# Patient Record
Sex: Female | Born: 1955 | Race: White | Hispanic: No | Marital: Married | Smoking: Former smoker
Health system: Southern US, Community
[De-identification: ages and names within clinical notes are randomized; demographics above are authoritative.]

## PROBLEM LIST (undated history)

## (undated) DIAGNOSIS — Z8249 Family history of ischemic heart disease and other diseases of the circulatory system: Secondary | ICD-10-CM

## (undated) DIAGNOSIS — M461 Sacroiliitis, not elsewhere classified: Secondary | ICD-10-CM

## (undated) DIAGNOSIS — E1169 Type 2 diabetes mellitus with other specified complication: Secondary | ICD-10-CM

## (undated) DIAGNOSIS — C210 Malignant neoplasm of anus, unspecified: Secondary | ICD-10-CM

## (undated) DIAGNOSIS — K589 Irritable bowel syndrome without diarrhea: Principal | ICD-10-CM

## (undated) DIAGNOSIS — F32A Depression, unspecified: Secondary | ICD-10-CM

## (undated) DIAGNOSIS — M542 Cervicalgia: Secondary | ICD-10-CM

## (undated) DIAGNOSIS — S72001A Fracture of unspecified part of neck of right femur, initial encounter for closed fracture: Secondary | ICD-10-CM

## (undated) DIAGNOSIS — M1990 Unspecified osteoarthritis, unspecified site: Secondary | ICD-10-CM

## (undated) DIAGNOSIS — E039 Hypothyroidism, unspecified: Secondary | ICD-10-CM

## (undated) DIAGNOSIS — I2510 Atherosclerotic heart disease of native coronary artery without angina pectoris: Secondary | ICD-10-CM

## (undated) DIAGNOSIS — G8929 Other chronic pain: Secondary | ICD-10-CM

## (undated) DIAGNOSIS — Z973 Presence of spectacles and contact lenses: Secondary | ICD-10-CM

## (undated) DIAGNOSIS — M549 Dorsalgia, unspecified: Secondary | ICD-10-CM

## (undated) DIAGNOSIS — R252 Cramp and spasm: Secondary | ICD-10-CM

## (undated) DIAGNOSIS — R232 Flushing: Secondary | ICD-10-CM

## (undated) DIAGNOSIS — Z5189 Encounter for other specified aftercare: Secondary | ICD-10-CM

## (undated) DIAGNOSIS — R9431 Abnormal electrocardiogram [ECG] [EKG]: Secondary | ICD-10-CM

## (undated) DIAGNOSIS — D473 Essential (hemorrhagic) thrombocythemia: Secondary | ICD-10-CM

## (undated) DIAGNOSIS — E785 Hyperlipidemia, unspecified: Secondary | ICD-10-CM

## (undated) DIAGNOSIS — F419 Anxiety disorder, unspecified: Secondary | ICD-10-CM

## (undated) DIAGNOSIS — H8110 Benign paroxysmal vertigo, unspecified ear: Secondary | ICD-10-CM

## (undated) DIAGNOSIS — Z8719 Personal history of other diseases of the digestive system: Secondary | ICD-10-CM

## (undated) DIAGNOSIS — I7409 Other arterial embolism and thrombosis of abdominal aorta: Secondary | ICD-10-CM

## (undated) DIAGNOSIS — R55 Syncope and collapse: Secondary | ICD-10-CM

## (undated) DIAGNOSIS — L659 Nonscarring hair loss, unspecified: Secondary | ICD-10-CM

## (undated) DIAGNOSIS — E119 Type 2 diabetes mellitus without complications: Secondary | ICD-10-CM

## (undated) DIAGNOSIS — F329 Major depressive disorder, single episode, unspecified: Secondary | ICD-10-CM

## (undated) DIAGNOSIS — D126 Benign neoplasm of colon, unspecified: Secondary | ICD-10-CM

## (undated) DIAGNOSIS — K219 Gastro-esophageal reflux disease without esophagitis: Secondary | ICD-10-CM

## (undated) DIAGNOSIS — R42 Dizziness and giddiness: Secondary | ICD-10-CM

## (undated) DIAGNOSIS — T7840XA Allergy, unspecified, initial encounter: Secondary | ICD-10-CM

## (undated) DIAGNOSIS — I739 Peripheral vascular disease, unspecified: Secondary | ICD-10-CM

## (undated) DIAGNOSIS — D519 Vitamin B12 deficiency anemia, unspecified: Secondary | ICD-10-CM

## (undated) DIAGNOSIS — R51 Headache: Secondary | ICD-10-CM

## (undated) DIAGNOSIS — I429 Cardiomyopathy, unspecified: Secondary | ICD-10-CM

## (undated) DIAGNOSIS — L280 Lichen simplex chronicus: Secondary | ICD-10-CM

## (undated) HISTORY — DX: Fracture of unspecified part of neck of right femur, initial encounter for closed fracture: S72.001A

## (undated) HISTORY — DX: Anxiety disorder, unspecified: F41.9

## (undated) HISTORY — DX: Dizziness and giddiness: R42

## (undated) HISTORY — DX: Unspecified osteoarthritis, unspecified site: M19.90

## (undated) HISTORY — DX: Irritable bowel syndrome without diarrhea: K58.9

## (undated) HISTORY — DX: Allergy, unspecified, initial encounter: T78.40XA

## (undated) HISTORY — DX: Lichen simplex chronicus: L28.0

## (undated) HISTORY — DX: Malignant neoplasm of anus, unspecified: C21.0

## (undated) HISTORY — DX: Encounter for other specified aftercare: Z51.89

## (undated) HISTORY — DX: Hyperlipidemia, unspecified: E78.5

## (undated) HISTORY — DX: Essential (hemorrhagic) thrombocythemia: D47.3

## (undated) HISTORY — DX: Cramp and spasm: R25.2

## (undated) HISTORY — DX: Depression, unspecified: F32.A

## (undated) HISTORY — DX: Family history of ischemic heart disease and other diseases of the circulatory system: Z82.49

## (undated) HISTORY — DX: Peripheral vascular disease, unspecified: I73.9

## (undated) HISTORY — DX: Hypothyroidism, unspecified: E03.9

## (undated) HISTORY — PX: TONSILLECTOMY: SUR1361

## (undated) HISTORY — DX: Headache: R51

## (undated) HISTORY — DX: Benign paroxysmal vertigo, unspecified ear: H81.10

## (undated) HISTORY — DX: Major depressive disorder, single episode, unspecified: F32.9

## (undated) HISTORY — DX: Cervicalgia: M54.2

## (undated) HISTORY — PX: ECTOPIC PREGNANCY SURGERY: SHX613

## (undated) HISTORY — DX: Type 2 diabetes mellitus with other specified complication: E11.69

## (undated) HISTORY — DX: Type 2 diabetes mellitus without complications: E11.9

## (undated) HISTORY — DX: Vitamin B12 deficiency anemia, unspecified: D51.9

## (undated) HISTORY — DX: Other arterial embolism and thrombosis of abdominal aorta: I74.09

## (undated) HISTORY — PX: PILONIDAL CYST EXCISION: SHX744

## (undated) HISTORY — DX: Atherosclerotic heart disease of native coronary artery without angina pectoris: I25.10

## (undated) HISTORY — PX: COLONOSCOPY: SHX174

## (undated) HISTORY — DX: Sacroiliitis, not elsewhere classified: M46.1

## (undated) HISTORY — DX: Gastro-esophageal reflux disease without esophagitis: K21.9

## (undated) HISTORY — PX: POLYPECTOMY: SHX149

## (undated) HISTORY — PX: OTHER SURGICAL HISTORY: SHX169

## (undated) HISTORY — DX: Benign neoplasm of colon, unspecified: D12.6

## (undated) HISTORY — DX: Cardiomyopathy, unspecified: I42.9

## (undated) HISTORY — DX: Syncope and collapse: R55

## (undated) HISTORY — PX: MULTIPLE TOOTH EXTRACTIONS: SHX2053

---

## 1998-06-18 ENCOUNTER — Other Ambulatory Visit: Admission: RE | Admit: 1998-06-18 | Discharge: 1998-06-18 | Payer: Self-pay | Admitting: *Deleted

## 2003-09-08 DIAGNOSIS — D126 Benign neoplasm of colon, unspecified: Secondary | ICD-10-CM

## 2003-09-08 HISTORY — DX: Benign neoplasm of colon, unspecified: D12.6

## 2003-09-16 ENCOUNTER — Encounter: Admission: RE | Admit: 2003-09-16 | Discharge: 2003-09-16 | Payer: Self-pay | Admitting: Pain Medicine

## 2005-02-19 LAB — HM COLONOSCOPY: HM Colonoscopy: NORMAL

## 2005-08-22 ENCOUNTER — Other Ambulatory Visit: Admission: RE | Admit: 2005-08-22 | Discharge: 2005-08-22 | Payer: Self-pay | Admitting: Obstetrics and Gynecology

## 2006-04-21 LAB — HM PAP SMEAR: HM Pap smear: NORMAL

## 2013-03-29 ENCOUNTER — Ambulatory Visit (INDEPENDENT_AMBULATORY_CARE_PROVIDER_SITE_OTHER): Payer: No Typology Code available for payment source | Admitting: Family Medicine

## 2013-03-29 ENCOUNTER — Encounter: Payer: Self-pay | Admitting: Family Medicine

## 2013-03-29 VITALS — BP 120/80 | HR 100 | Temp 98.2°F | Ht 63.75 in | Wt 117.0 lb

## 2013-03-29 DIAGNOSIS — Z7189 Other specified counseling: Secondary | ICD-10-CM

## 2013-03-29 DIAGNOSIS — K589 Irritable bowel syndrome without diarrhea: Secondary | ICD-10-CM

## 2013-03-29 DIAGNOSIS — L719 Rosacea, unspecified: Secondary | ICD-10-CM

## 2013-03-29 DIAGNOSIS — L280 Lichen simplex chronicus: Secondary | ICD-10-CM

## 2013-03-29 DIAGNOSIS — R519 Headache, unspecified: Secondary | ICD-10-CM

## 2013-03-29 DIAGNOSIS — Z8349 Family history of other endocrine, nutritional and metabolic diseases: Secondary | ICD-10-CM

## 2013-03-29 DIAGNOSIS — Z136 Encounter for screening for cardiovascular disorders: Secondary | ICD-10-CM

## 2013-03-29 DIAGNOSIS — Z7689 Persons encountering health services in other specified circumstances: Secondary | ICD-10-CM

## 2013-03-29 DIAGNOSIS — R51 Headache: Secondary | ICD-10-CM

## 2013-03-29 HISTORY — DX: Headache, unspecified: R51.9

## 2013-03-29 HISTORY — DX: Irritable bowel syndrome, unspecified: K58.9

## 2013-03-29 HISTORY — DX: Lichen simplex chronicus: L28.0

## 2013-03-29 LAB — BASIC METABOLIC PANEL
BUN: 16 mg/dL (ref 6–23)
CO2: 21 mEq/L (ref 19–32)
Calcium: 9 mg/dL (ref 8.4–10.5)
Chloride: 108 mEq/L (ref 96–112)
Creatinine, Ser: 0.9 mg/dL (ref 0.4–1.2)
GFR: 71.43 mL/min (ref >60.00)
Glucose, Bld: 77 mg/dL (ref 70–99)
Potassium: 5.3 mEq/L — ABNORMAL HIGH (ref 3.5–5.1)
Sodium: 139 mEq/L (ref 135–145)

## 2013-03-29 LAB — LIPID PANEL
Cholesterol: 281 mg/dL — ABNORMAL HIGH (ref 0–200)
HDL: 40.7 mg/dL (ref >39.00)
Total CHOL/HDL Ratio: 7
Triglycerides: 178 mg/dL — ABNORMAL HIGH (ref 0.0–149.0)
VLDL: 35.6 mg/dL (ref 0.0–40.0)

## 2013-03-29 LAB — HEMOGLOBIN A1C: Hgb A1c MFr Bld: 6.7 % — ABNORMAL HIGH (ref 4.6–6.5)

## 2013-03-29 LAB — TSH: TSH: 0.57 u[IU]/mL (ref 0.35–5.50)

## 2013-03-29 MED ORDER — GABAPENTIN 600 MG PO TABS: 600.0000 mg | ORAL_TABLET | Freq: Two times a day (BID) | ORAL | Status: DC

## 2013-03-29 NOTE — Progress Notes (Signed)
Chief Complaint  Patient presents with  . Establish Care  . Hot Flashes  . Fatigue  . Alopecia    HPI:  Erin [name] is here to establish care. Used to see Dr. Charmian Muff and then [facility] - but has been a long time since she has seen a doctor. Last PCP and physical: last pap and physical in 2006.  Has the following chronic problems and concerns today:  Patient Active Problem List   Diagnosis Date Noted  . IBS (irritable bowel syndrome) 03/29/2013  . Chronic daily headache 03/29/2013  . 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   Chronic Back Pain and Anxiety and Neurodermatitis: -prescribed neurontin many years ago for this and takes this  Chronic Headaches: -her whole life -take BCs for this -has a few per week -not changed -denies aura, nausea, vomiting, photosensity  IBS: -uses dulcolax and gas-x for this -saw GI in the past  Tobacco Use: -she has no interest in quitting -it helps her nerves -she understands the risks, but would rather smoke then quit -used many meds in the past buspar, elavil, Wellbutrin - didn't work  ROS: See pertinent positives and negatives per HPI.  Past Medical History  Diagnosis Date  . 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 Chronic daily headache 03/29/2013  . IBS (irritable bowel syndrome) 03/29/2013    Family History  Problem Relation Age of Onset  . Arthritis Mother   . Hyperlipidemia Mother   . Heart disease Mother   . Hypertension Mother   . Stroke Mother 97  . Heart disease Father   . Hyperlipidemia Father   . Hypertension Father   . Stroke Father 7  . Cancer Brother     lung    History   Social History  . Marital Status: Married    Spouse Name: N/A    Number of Children: N/A  . Years of Education: N/A   Social History Main Topics  . Smoking status: Current Every Day Smoker -- 1.00 packs/day    Types: Cigarettes  . Smokeless tobacco: None  . Alcohol Use: No  . Drug Use: None  . Sexually Active: Yes -- Female partner(s)   Other  Topics Concern  . None   Social History Narrative   Work or School: homemaker      Home Situation: lives with her husband, takes care of her parents       Spiritual Beliefs: Christian      Lifestyle: no regular exercise, poor diet             Current outpatient prescriptions:bisacodyl (DULCOLAX) 5 MG EC tablet, Take 15 mg by mouth daily as needed for constipation., Disp: , Rfl: ;  gabapentin (NEURONTIN) 600 MG tablet, Take 1 tablet (600 mg total) by mouth 2 (two) times daily., Disp: 180 tablet, Rfl: 0;  simethicone (GAS-X) 80 MG chewable tablet, Chew 80 mg by mouth every 6 (six) hours as needed for flatulence., Disp: , Rfl:   EXAM:  Filed Vitals:   03/29/13 1115  BP: 120/80  Pulse: 100  Temp: 98.2 F (36.8 C)    Body mass index is 20.25 kg/(m^2).  GENERAL: vitals reviewed and listed above, alert, oriented, appears well hydrated and in no acute distress  HEENT: atraumatic, conjunttiva clear, no obvious abnormalities on inspection of external nose and ears  NECK: no obvious masses on inspection  LUNGS: clear to auscultation bilaterally, no wheezes, rales or rhonchi, good air movement  CV: HRRR, no peripheral edema  MS: moves all extremities without noticeable abnormality  PSYCH: pleasant and cooperative, no obvious depression or anxiety  ASSESSMENT AND PLAN:  Discussed the following assessment and plan:  IBS (irritable bowel syndrome)  Chronic daily headache  Neurodermatitis - Plan: gabapentin (NEURONTIN) 600 MG tablet  Family history of hypothyroidism - Plan: TSH  Encounter to establish care - Plan: Lipid Panel, Hemoglobin A1c, Basic metabolic panel  -We reviewed the PMH, PSH, FH, SH, Meds and Allergies. -We provided refills for any medications we will prescribe as needed. -We addressed current concerns per orders and patient instructions. -We have asked for records for pertinent exams, studies, vaccines and notes from previous providers. -We have advised  patient to follow up per instructions below. -advised mammograms, colon, physical with pap - she refuses most screening measures but agrees to come for pap and physical -advised to quit smoking, she is not ready to do this now -FASTING LABS  -Patient advised to return or notify a doctor immediately if symptoms worsen or persist or new concerns arise.  Patient Instructions  -We have ordered labs or studies at this visit. It can take up to 1-2 weeks for results and processing. We will contact you with instructions IF your results are abnormal. Normal results will be released to your [facility]. If you have not heard from [place] or can not find your results in [facility] in 2 weeks please contact our office.  -PLEASE SIGN UP FOR MYCHART TODAY   -do self breast exam every month, consider mammogram - it is recommended  -consider counseling, please consider quitting smoking  -keep headache journal, wean off of aspirin, ibuprofen tylenol for 6 months, use topical menthol (tiger balm) instead  We recommend the following healthy lifestyle measures: - eat a healthy diet consisting of lots of vegetables, fruits, beans, nuts, seeds, healthy meats such as white chicken and fish and whole grains.  - avoid fried foods, fast food, processed foods, sodas, red meet and other fattening foods.  - get a least 150 minutes of aerobic exercise per week.   Follow up in: the next one month for your physical exam and to discuss labs      Mattthew Ziomek, [facility] R.

## 2013-03-29 NOTE — Patient Instructions (Signed)
-  We have ordered labs or studies at this visit. It can take up to 1-2 weeks for results and processing. We will contact you with instructions IF your results are abnormal. Normal results will be released to your [facility]. If you have not heard from [place] or can not find your results in [facility] in 2 weeks please contact our office.  -PLEASE SIGN UP FOR MYCHART TODAY   -do self breast exam every month, consider mammogram - it is recommended  -consider counseling, please consider quitting smoking  -keep headache journal, wean off of aspirin, ibuprofen tylenol for 6 months, use topical menthol (tiger balm) instead  We recommend the following healthy lifestyle measures: - eat a healthy diet consisting of lots of vegetables, fruits, beans, nuts, seeds, healthy meats such as white chicken and fish and whole grains.  - avoid fried foods, fast food, processed foods, sodas, red meet and other fattening foods.  - get a least 150 minutes of aerobic exercise per week.   Follow up in: the next one month for your physical exam and to discuss labs

## 2013-04-01 ENCOUNTER — Telehealth: Payer: Self-pay | Admitting: Family Medicine

## 2013-04-01 DIAGNOSIS — E119 Type 2 diabetes mellitus without complications: Secondary | ICD-10-CM

## 2013-04-01 LAB — LDL CHOLESTEROL, DIRECT: Direct LDL: 199.3 mg/dL

## 2013-04-01 NOTE — Telephone Encounter (Signed)
Lab findings:  -unfortunately labs suggest a diagnosis of diabetes and high cholesterol -a healthy diet, regular cardiovascular exercise and smoking cessation will be very important to help treat these and avoid health complications  -Needs to schedule follow up appointment in 2 months and will recheck labs then and discuss treatment options. Alisha, please schedule this appointment.   -in the meantime if she would like to see the nutritionist and diabetes educator - please place referral. Thanks.

## 2013-04-01 NOTE — Telephone Encounter (Signed)
Called and spoke with pt and pt is aware.  Pt would like referral for nutritionist and diabetic educator.

## 2013-04-01 NOTE — Addendum Note (Signed)
Addended by: Azucena Freed on: 04/01/2013 02:59 PM   Modules accepted: Orders

## 2013-04-01 NOTE — Telephone Encounter (Signed)
Referral ordered

## 2013-04-22 ENCOUNTER — Encounter: Payer: Self-pay | Admitting: Family Medicine

## 2013-04-22 ENCOUNTER — Other Ambulatory Visit (HOSPITAL_COMMUNITY)
Admission: RE | Admit: 2013-04-22 | Discharge: 2013-04-22 | Disposition: A | Discharge status: Home / Self Care | Payer: No Typology Code available for payment source | Source: Ambulatory Visit | Attending: Family Medicine | Admitting: Family Medicine

## 2013-04-22 ENCOUNTER — Ambulatory Visit (INDEPENDENT_AMBULATORY_CARE_PROVIDER_SITE_OTHER): Payer: No Typology Code available for payment source | Admitting: Family Medicine

## 2013-04-22 ENCOUNTER — Other Ambulatory Visit (HOSPITAL_COMMUNITY): Admission: RE | Admit: 2013-04-22 | Payer: No Typology Code available for payment source | Source: Ambulatory Visit

## 2013-04-22 VITALS — BP 110/70 | Temp 98.2°F | Ht 63.75 in | Wt 118.0 lb

## 2013-04-22 DIAGNOSIS — F17200 Nicotine dependence, unspecified, uncomplicated: Secondary | ICD-10-CM

## 2013-04-22 DIAGNOSIS — J309 Allergic rhinitis, unspecified: Secondary | ICD-10-CM

## 2013-04-22 DIAGNOSIS — E119 Type 2 diabetes mellitus without complications: Secondary | ICD-10-CM

## 2013-04-22 DIAGNOSIS — E785 Hyperlipidemia, unspecified: Secondary | ICD-10-CM

## 2013-04-22 DIAGNOSIS — Z0000 Encounter for general adult medical examination without abnormal findings: Secondary | ICD-10-CM

## 2013-04-22 DIAGNOSIS — Z1151 Encounter for screening for human papillomavirus (HPV): Secondary | ICD-10-CM | POA: Insufficient documentation

## 2013-04-22 DIAGNOSIS — Z01419 Encounter for gynecological examination (general) (routine) without abnormal findings: Secondary | ICD-10-CM | POA: Insufficient documentation

## 2013-04-22 DIAGNOSIS — Z1211 Encounter for screening for malignant neoplasm of colon: Secondary | ICD-10-CM

## 2013-04-22 DIAGNOSIS — N6459 Other signs and symptoms in breast: Secondary | ICD-10-CM

## 2013-04-22 MED ORDER — PRAVASTATIN SODIUM 20 MG PO TABS: 20.0000 mg | ORAL_TABLET | Freq: Every day | ORAL | Status: DC

## 2013-04-22 MED ORDER — FLUTICASONE PROPIONATE 50 MCG/ACT NA SUSP: 2.0000 | Freq: Every day | NASAL | Status: DC

## 2013-04-22 NOTE — Progress Notes (Signed)
Chief Complaint  Patient presents with  . Annual Exam    HPI:  Here for CPE:  -Concerns today:   1) New diagnosis of diabetes: Lab Results  Component Value Date   HGBA1C 6.7* 03/29/2013  -referred to diabetes educator -currently controlled with lifestyle: plans on starting regular exercise program, no regular cardiovascular exercise, has been eating more vegetables  2) Hyperlipidemia - new on labs: Lab Results  Component Value Date   CHOL 281* 03/29/2013   HDL 40.70 03/29/2013   LDLDIRECT 199.3 03/29/2013   TRIG 178.0* 03/29/2013   CHOLHDL 7 03/29/2013   3) Tobacco Use: -has tried many different med in the past -not interested in quitting  4)Allergic rhinitis: -seasonal -sneezing, nasal congestion  -Diet: variety of foods, balance and well rounded, larger portion sizes  -Taking folic acid: no  -Exercise: no regular exercise  -Diabetes and Dyslipidemia Screening: see above  -Hx of HTN: no  -Vaccines: UTD  -pap history: last pap was 2006 she thinks, no hx abnormal pap per her report  -FDLMP: N/A, no bleeding  -sexual activity: no, not in last 10 years per her report  -wants STI testing: no  -FH breast, colon or ovarian ca: see FH -refuses breast ca screening with mammo, will do breast exams -wants referral for colonoscopy  -Alcohol, Tobacco, drug use: see social history  Review of Systems - change in ha, vision changes, cp, sob, doe, cough, fevers, weight loss, swelling, syncope, rash, abnormal bleeding, vaginal discharge, breast lumps or discharge  Past Medical History  Diagnosis Date  . 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 Chronic daily headache 03/29/2013  . IBS (irritable bowel syndrome) 03/29/2013    Family History  Problem Relation Age of Onset  . Arthritis Mother   . Hyperlipidemia Mother   . Heart disease Mother   . Hypertension Mother   . Stroke Mother 52  . Heart disease Father   . Hyperlipidemia Father   . Hypertension Father   . Stroke Father 70  .  Cancer Brother     lung    History   Social History  . Marital Status: Married    Spouse Name: N/A    Number of Children: N/A  . Years of Education: N/A   Social History Main Topics  . Smoking status: Current Every Day Smoker -- 1.00 packs/day    Types: Cigarettes  . Smokeless tobacco: None  . Alcohol Use: No  . Drug Use: None  . Sexually Active: Yes -- Female partner(s)   Other Topics Concern  . None   Social History Narrative   Work or School: homemaker      Home Situation: lives with her husband, takes care of her parents       Spiritual Beliefs: Christian      Lifestyle: no regular exercise, poor diet             Current outpatient prescriptions:bisacodyl (DULCOLAX) 5 MG EC tablet, Take 15 mg by mouth daily as needed for constipation., Disp: , Rfl: ;  gabapentin (NEURONTIN) 600 MG tablet, Take 600 mg by mouth 3 (three) times daily., Disp: , Rfl: ;  simethicone (GAS-X) 80 MG chewable tablet, Chew 80 mg by mouth every 6 (six) hours as needed for flatulence., Disp: , Rfl:  fluticasone (FLONASE) 50 MCG/ACT nasal spray, Place 2 sprays into the nose daily., Disp: 16 g, Rfl: 6;  pravastatin (PRAVACHOL) 20 MG tablet, Take 1 tablet (20 mg total) by mouth daily., Disp: 90 tablet, Rfl: 3  EXAM:  Filed Vitals:   04/22/13 0933  BP: 110/70  Temp: 98.2 F (36.8 C)    GENERAL: vitals reviewed and listed below, alert, oriented, appears well hydrated and in no acute distress  HEENT: head atraumatic, PERRLA, normal appearance of eyes, ears, nose and mouth. moist mucus membranes.  NECK: supple, no masses or lymphadenopathy  LUNGS: clear to auscultation bilaterally, no rales, rhonchi or wheeze  CV: HRRR, no peripheral edema or cyanosis, normal pedal pulses  BREAST: normal appearance - no lesions or discharge, on palpation normal breast tissue without any suspicious masses except for few small increased areas of density in L breast at 2 and 3 O'clock, mobile and  tender  ABDOMEN: bowel sounds normal, soft, non tender to palpation, no masses, no rebound or guarding  GU: normal appearance of external genitalia - no lesions or masses, normal vaginal mucosa - no abnormal discharge, normal appearance of cervix - no lesions or abnormal discharge, no masses or tenderness on palpation of uterus and ovaries.  RECTAL: refused  SKIN: no rash or abnormal lesions  MS: normal gait, moves all extremities normally  NEURO: CN II-XII grossly intact, normal muscle strength and sensation to light touch on extremities  PSYCH: normal affect, pleasant and cooperative  ASSESSMENT AND PLAN:  Discussed the following assessment and plan:  Hyperlipemia - Plan: pravastatin (PRAVACHOL) 20 MG tablet -discussed options, will start pravastatin - discussed risks, follow up 3 months  Diabetes mellitus, type 2 -she cancelled diabetes educator appt due to cost, counseled on lifestyle changes -follow up 3 months  Tobacco use disorder -advised to quit, discussed risks, offered help, she is not ready to quit  Colon cancer screening - Plan: Ambulatory referral to Gastroenterology  Abnormal breast exam - Plan: MM Digital Diagnostic Bilat -likely fibrocystic, diagnostic mammo referral  Allergic rhinitis - Plan: fluticasone (FLONASE) 50 MCG/ACT nasal spray -discussed options, start flonase, risks and use discussed  -Discussed and advised all [place] preventive services health task force level A and B recommendations for age, sex and risks.  -Advised at least 150 minutes of exercise per week and a healthy diet low in saturated fats and sweets and consisting of fresh fruits and vegetables, lean meats such as fish and white chicken and whole grains.  -labs, studies and vaccines per orders this encounter  Orders Placed This Encounter  Procedures  . MM Digital Diagnostic Bilat    Standing Status: Future     Number of Occurrences:      Standing Expiration Date: 06/22/2014    Order  Specific Question:  Reason for Exam (SYMPTOM  OR DIAGNOSIS REQUIRED)    Answer:  has not had mammo in some time, abnormal breast exam - L, likely fibrocystic    Order Specific Question:  Is the patient pregnant?    Answer:  No    Order Specific Question:  Preferred imaging location?    Answer:  [facility]  . Ambulatory referral to Gastroenterology    Referral Priority:  Routine    Referral Type:  Consultation    Referral Reason:  Specialty Services Required    Requested Specialty:  Gastroenterology    Number of Visits Requested:  1    Patient Instructions  -We have ordered a pap smear. It can take up to 1-2 weeks for results and processing. We will contact you with instructions IF your results are abnormal. Normal results will be released to your [facility]. If you have not heard from [place] or can not find your results in  MYCHART in 2 weeks please contact our office.  -We placed a referral for you as discussed for your colonoscopy and a mammogram. It usually takes about 1-2 weeks to process and schedule this referral. If you have not heard from [place] regarding this appointment in 2 weeks please contact our office.  -As we discussed, we have prescribed a new medication (Pravastatin) for you at this appointment. We discussed the common and serious potential adverse effects of this medication and you can review these and more with the pharmacist when you pick up your medication.  Please follow the instructions for use carefully and notify us immediately if you have any problems taking this medication.  -stop smoking  -low carb or no carb diet  -regular cardiovascular exercise - 30 minutes at least 5 days per week  -follow up in 3 months             Patient advised to return to clinic immediately if symptoms worsen or persist or new concerns.  @LIFEPLAN @  Return in about 3 months (around 07/23/2013) for diabetes, HLD, tobacco use.  Kriste Basque R.

## 2013-04-22 NOTE — Patient Instructions (Addendum)
-  We have ordered a pap smear. It can take up to 1-2 weeks for results and processing. We will contact you with instructions IF your results are abnormal. Normal results will be released to your [facility]. If you have not heard from [place] or can not find your results in [facility] in 2 weeks please contact our office.  -We placed a referral for you as discussed for your colonoscopy and a mammogram. It usually takes about 1-2 weeks to process and schedule this referral. If you have not heard from [place] regarding this appointment in 2 weeks please contact our office.  -As we discussed, we have prescribed a new medication (Pravastatin) for you at this appointment. We discussed the common and serious potential adverse effects of this medication and you can review these and more with the pharmacist when you pick up your medication.  Please follow the instructions for use carefully and notify us immediately if you have any problems taking this medication.  -stop smoking  -low carb or no carb diet  -regular cardiovascular exercise - 30 minutes at least 5 days per week  -follow up in 3 months

## 2013-04-22 NOTE — Addendum Note (Signed)
Addended by: Azucena Freed on: 04/22/2013 10:37 AM   Modules accepted: Orders

## 2013-04-23 ENCOUNTER — Ambulatory Visit: Payer: No Typology Code available for payment source

## 2013-04-24 ENCOUNTER — Telehealth: Payer: Self-pay | Admitting: Family Medicine

## 2013-04-24 ENCOUNTER — Encounter: Payer: Self-pay | Admitting: Gastroenterology

## 2013-04-24 NOTE — Telephone Encounter (Signed)
PT called and stated that [facility] denies ever receiving her RX's for pravastatin (PRAVACHOL) 20 MG tablet, and fluticasone (FLONASE) 50 MCG/ACT nasal spray. Please assist.

## 2013-04-24 NOTE — Telephone Encounter (Signed)
Called the pharmacy and spoke with an associate that states in the profile it states that pt has an allergy to steroids.  Both rx were received.

## 2013-04-24 NOTE — Telephone Encounter (Signed)
Called and spoke with pt and pt states she forgot to tell us that she is allergic to prednsione.  Can rx for flonase be sent to pharmacy.

## 2013-04-24 NOTE — Telephone Encounter (Signed)
Called [facility] to make aware pt can have Flonase.

## 2013-04-24 NOTE — Telephone Encounter (Signed)
Left a vm that pt can have flonase filled.

## 2013-04-24 NOTE — Telephone Encounter (Signed)
Pt states she can take flonase because she has taken her mother's before.  Spoke with Dr. Selena Batten ok for pt to have flonase.

## 2013-04-25 NOTE — Progress Notes (Signed)
Called and spoke with Amy in cytology and they will return call with information.

## 2013-04-25 NOTE — Progress Notes (Signed)
Quick Note:  Called and spoke with pt and pt is aware. ______ 

## 2013-04-25 NOTE — Progress Notes (Signed)
Quick Note:  Called and left a message for pt to return call. ______ 

## 2013-05-13 ENCOUNTER — Ambulatory Visit
Admission: RE | Admit: 2013-05-13 | Discharge: 2013-05-13 | Disposition: A | Discharge status: Home / Self Care | Payer: No Typology Code available for payment source | Source: Ambulatory Visit | Attending: Family Medicine | Admitting: Family Medicine

## 2013-05-13 ENCOUNTER — Other Ambulatory Visit: Payer: Self-pay | Admitting: Family Medicine

## 2013-05-13 ENCOUNTER — Telehealth: Payer: Self-pay

## 2013-05-13 DIAGNOSIS — N6459 Other signs and symptoms in breast: Secondary | ICD-10-CM

## 2013-05-13 NOTE — Telephone Encounter (Signed)
This is a very low dose and i suspect this will improve after taking it for a few weeks. Would advise putting tablet in applesauce or other soft food and this should improve. Let us know if it does not.

## 2013-05-13 NOTE — Telephone Encounter (Signed)
VM:  Pt called and states that she is experiencing nausea with taking the Pravastin.  Pt states she tried taking the medication at night without food and it made her sick.  Pt then started taking the medication in the middle of the day with food and she is still experiencing nausea.  Pt would like to know if a medication can be called in for nausea or what she needs to do.  Pls advise.

## 2013-05-13 NOTE — Telephone Encounter (Signed)
Called and spoke with pt and pt is aware of Dr. Elmyra Ricks recommendations.  Pt states she is now taking medication at lunch with food and her symptoms are improving. Pt will call back if symptoms worsen.

## 2013-05-13 NOTE — Progress Notes (Signed)
Quick Note:  Called and spoke with pt and pt is aware. ______ 

## 2013-05-27 ENCOUNTER — Encounter: Payer: Self-pay | Admitting: Gastroenterology

## 2013-06-11 ENCOUNTER — Encounter: Payer: Self-pay | Admitting: Gastroenterology

## 2013-06-11 ENCOUNTER — Ambulatory Visit (INDEPENDENT_AMBULATORY_CARE_PROVIDER_SITE_OTHER): Payer: No Typology Code available for payment source | Admitting: Gastroenterology

## 2013-06-11 VITALS — BP 102/66 | HR 84 | Ht 63.5 in | Wt 117.1 lb

## 2013-06-11 DIAGNOSIS — K5900 Constipation, unspecified: Secondary | ICD-10-CM

## 2013-06-11 DIAGNOSIS — Z86010 Personal history of colonic polyps: Secondary | ICD-10-CM

## 2013-06-11 DIAGNOSIS — K6289 Other specified diseases of anus and rectum: Secondary | ICD-10-CM

## 2013-06-11 MED ORDER — PEG-KCL-NACL-NASULF-NA ASC-C 100 G PO SOLR: 1.0000 | Freq: Once | ORAL | Status: DC

## 2013-06-11 MED ORDER — DILTIAZEM GEL 2 %: 1.0000 "application " | Freq: Three times a day (TID) | CUTANEOUS | Status: DC

## 2013-06-11 NOTE — Patient Instructions (Addendum)
We have sent the following medications to [facility] in [facility] for you to pick up at your convenience: Diltiazem.  Use a stool softener every day.   Use Sitz bath as needed. Sitz Bath A sitz bath is a warm water bath taken in the sitting position that covers only the hips and buttocks. It may be used for either healing or hygiene purposes. Sitz baths are also used to relieve pain, itching, or muscle spasms. The water may contain medicine. Moist heat will help you heal and relax.  HOME CARE INSTRUCTIONS  Take 3 to 4 sitz baths a day. 1. Fill the bathtub half full with warm water. 2. Sit in the water and open the drain a little. 3. Turn on the warm water to keep the tub half full. Keep the water running constantly. 4. Soak in the water for 15 to 20 minutes. 5. After the sitz bath, pat the affected area dry first. SEEK MEDICAL CARE IF:  You get worse instead of better. Stop the sitz baths if you get worse. MAKE SURE YOU:  Understand these instructions.  Will watch your condition.  Will get help right away if you are not doing well or get worse. Document Released: 07/30/2004 Document Revised: 08/01/2012 Document Reviewed: 02/04/2011 [facility] Patient Information 2014 [place], [place].   You have been scheduled for a colonoscopy with propofol. Please follow written instructions given to you at your visit today.  Please pick up your prep kit at the pharmacy within the next 1-3 days. If you use inhalers (even only as needed), please bring them with you on the day of your procedure. Your physician has requested that you go to www.startemmi.com and enter the access code given to you at your visit today. This web site gives a general overview about your procedure. However, you should still follow specific instructions given to you by our office regarding your preparation for the procedure.  Thank you for choosing me and [facility].  Venita Lick. Pleas Koch., MD.,  Clementeen Graham  cc: Kriste Basque, DO

## 2013-06-11 NOTE — Progress Notes (Signed)
History of Present Illness: This is a 57 year old female with rectal pain for 3 weeks. She notes a sharp shooting pain and then a throbbing pain. She used suppositories and declofenac for 2 weeks and her symptoms have improved. She takes Ducolax pills daily for constipation for many years. Denies weight loss, abdominal pain, diarrhea, change in stool caliber, melena, hematochezia, nausea, vomiting, dysphagia, reflux symptoms, chest pain.  Review of Systems: Pertinent positive and negative review of systems were noted in the above HPI section. All other review of systems were otherwise negative.  Current Medications, Allergies, Past Medical History, Past Surgical History, Family History and Social History were reviewed in [employer] record.  Physical Exam: General: Well developed , well nourished, no acute distress Head: Normocephalic and atraumatic Eyes:  sclerae anicteric, EOMI Ears: Normal auditory acuity Mouth: No deformity or lesions Neck: Supple, no masses or thyromegaly Lungs: Clear throughout to auscultation Heart: Regular rate and rhythm; no murmurs, rubs or bruits Abdomen: Soft, non tender and non distended. No masses, hepatosplenomegaly or hernias noted. Normal Bowel sounds Rectal: no lesions noted externally, no fissure noted, marked anal canal tenderness, unable to do a complete DRE, heme negative stool  Musculoskeletal: Symmetrical with no gross deformities  Skin: No lesions on visible extremities Pulses:  Normal pulses noted Extremities: No clubbing, cyanosis, edema or deformities noted Neurological: Alert oriented x 4, grossly nonfocal Cervical Nodes:  No significant cervical adenopathy Inguinal Nodes: No significant inguinal adenopathy Psychological:  Alert and cooperative. Normal mood and affect  Assessment and Recommendations:  1. Rectal pain. R/O fissure, hemorrhoids. Diltiazem 2% cream tid for 8 weeks. Stool softener and Sitz baths daily.  2.  IBS-C, chronic. Continue Ducolax tablets  3. Personal history of adenomatous colon polyps, initially diagnosed in 2004. She did not return for recommended surveillance colonoscopy in 2009. The risks, benefits, and alternatives to colonoscopy with possible biopsy and possible polypectomy were discussed with the patient and they consent to proceed.

## 2013-06-13 ENCOUNTER — Encounter: Payer: Self-pay | Admitting: Gastroenterology

## 2013-06-26 ENCOUNTER — Telehealth: Payer: Self-pay | Admitting: Family Medicine

## 2013-06-26 MED ORDER — GABAPENTIN 600 MG PO TABS: 600.0000 mg | ORAL_TABLET | Freq: Three times a day (TID) | ORAL | Status: DC

## 2013-06-26 NOTE — Telephone Encounter (Signed)
Rx sent to pharmacy   

## 2013-06-26 NOTE — Telephone Encounter (Signed)
Called and spoke pt and pt states the neurontin was increased by Dr. Selena Batten- pt states she did not get the rx when he was here.  Pls advise.

## 2013-06-26 NOTE — Telephone Encounter (Signed)
Ok to refill. I do remember discussing at initial visit, I do not remember increasing the dose, but if she feels needs increased dose ok to refill for 600mg  tid.

## 2013-06-26 NOTE — Telephone Encounter (Signed)
I am not sure whom increased her neurontin? She believe she told us took bid?  Should address any rectal issues with her gastroenterologist.

## 2013-06-26 NOTE — Telephone Encounter (Signed)
Pt states this was discussed at her appt due to pt itching.  Pt states it was agreed upon at the visit.

## 2013-06-26 NOTE — Addendum Note (Signed)
Addended by: Azucena Freed on: 06/26/2013 11:30 AM   Modules accepted: Orders

## 2013-06-26 NOTE — Telephone Encounter (Signed)
Pt established in May of this year.  This medication has not been filled before.  Can I send in rx?

## 2013-06-26 NOTE — Telephone Encounter (Signed)
Pt needs new RX for gabapentin (NEURONTIN) 600 MG tablet. This Rx was increased to 3 X /day but no new RX was done. Pharm: [facility]/[address]  Pt is having rectal issues and could not complete her colonoscopy. Resc for 9/15.  Pt has internal hemmorroids. Dr Russella Dar suggested she wait until the swelling goes down. Pt states she has been in bad pain w/ this.

## 2013-07-04 MED ORDER — GABAPENTIN 600 MG PO TABS: 600.0000 mg | ORAL_TABLET | Freq: Three times a day (TID) | ORAL | Status: DC

## 2013-07-04 NOTE — Addendum Note (Signed)
Addended by: Azucena Freed on: 07/04/2013 04:20 PM   Modules accepted: Orders

## 2013-07-04 NOTE — Telephone Encounter (Signed)
Pt called again and requested to have medication sent to [facility] on [address].

## 2013-07-12 ENCOUNTER — Encounter: Payer: No Typology Code available for payment source | Admitting: Gastroenterology

## 2013-07-15 ENCOUNTER — Telehealth: Payer: Self-pay | Admitting: Gastroenterology

## 2013-07-15 NOTE — Telephone Encounter (Signed)
Patient reports rectal pain with using diltiazem cream.  She is asking for a suppository for pain.  She is advised to try Recticare prior to insertion of diltiazem.  She will try this and call back if this does not help

## 2013-07-16 ENCOUNTER — Telehealth: Payer: Self-pay

## 2013-07-16 NOTE — Telephone Encounter (Signed)
Received 2 fax from [facility] for pt's pravastin and flonase.  Called to verify pt's DOB because on the [facility] paperwork the DOB is incorrect.  Advised pt of this and pt states she has tried to correct this with [facility].  Pt states she gets a penalty once she has gotten a medication locally that it cost more.  Advised pt that rx request had been received and faxed back to [facility].

## 2013-07-29 ENCOUNTER — Encounter: Payer: Self-pay | Admitting: Family Medicine

## 2013-07-29 ENCOUNTER — Ambulatory Visit (INDEPENDENT_AMBULATORY_CARE_PROVIDER_SITE_OTHER): Payer: No Typology Code available for payment source | Admitting: Family Medicine

## 2013-07-29 VITALS — BP 100/72 | Temp 98.3°F | Wt 118.0 lb

## 2013-07-29 DIAGNOSIS — E785 Hyperlipidemia, unspecified: Secondary | ICD-10-CM

## 2013-07-29 DIAGNOSIS — L280 Lichen simplex chronicus: Secondary | ICD-10-CM

## 2013-07-29 DIAGNOSIS — K589 Irritable bowel syndrome without diarrhea: Secondary | ICD-10-CM

## 2013-07-29 DIAGNOSIS — E119 Type 2 diabetes mellitus without complications: Secondary | ICD-10-CM

## 2013-07-29 MED ORDER — GABAPENTIN 600 MG PO TABS: 600.0000 mg | ORAL_TABLET | Freq: Three times a day (TID) | ORAL | Status: DC

## 2013-07-29 NOTE — Progress Notes (Signed)
No chief complaint on file.   HPI:  Follow up:  1)DM - recently diagnosed: -referred to diabetes educator, lifestyle controlled -advised exercise and healthy diet Lab Results  Component Value Date   HGBA1C 6.7* 03/29/2013    2)HLD: -added pravastatin last visit Lab Results  Component Value Date   CHOL 281* 03/29/2013   HDL 40.70 03/29/2013   LDLDIRECT 199.3 03/29/2013   TRIG 178.0* 03/29/2013   CHOLHDL 7 03/29/2013  -fasting today  3)Tobacco use: -not interested in quitting  AR: -using flonase  Chronic headaches/neurodermatitis: -on neurotin  IBS/rectal pain: -sees GI, Dr. Russella Dar - has fissures; rectacare has helped -colonoscopy scheduled  HM: Offered flu and tdap today -scheduled for colonoscopy  ROS: See pertinent positives and negatives per HPI.  Past Medical History  Diagnosis Date  . 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 Chronic daily headache 03/29/2013  . IBS (irritable bowel syndrome) 03/29/2013  . Tubular adenoma of colon 2004  . Anxiety   . Depression   . DM (diabetes mellitus)   . HLD (hyperlipidemia)     Past Surgical History  Procedure Laterality Date  . Ectopic pregnancy surgery    . Pilonidal cyst excision      Family History  Problem Relation Age of Onset  . Arthritis Mother   . Hyperlipidemia Mother   . Heart disease Mother   . Hypertension Mother   . Stroke Mother 34  . Heart disease Father   . Hyperlipidemia Father   . Hypertension Father   . Stroke Father 46  . Lung cancer Brother   . Irritable bowel syndrome Mother   . Thyroid disease Father   . Thyroid disease Mother     History   Social History  . Marital Status: Married    Spouse Name: N/A    Number of Children: 0  . Years of Education: N/A   Occupational History  . cargiver    Social History Main Topics  . Smoking status: Current Every Day Smoker -- 1.00 packs/day    Types: Cigarettes  . Smokeless tobacco: Never Used  . Alcohol Use: No  . Drug Use: No  . Sexual Activity:  Yes    Partners: Male   Other Topics Concern  . None   Social History Narrative   Work or School: homemaker      Home Situation: lives with her husband, takes care of her parents       Spiritual Beliefs: Christian      Lifestyle: no regular exercise, poor diet             Current outpatient prescriptions:Aspirin-Salicylamide-Caffeine (BC HEADACHE POWDER PO), Take by mouth as needed., Disp: , Rfl: ;  bisacodyl (DULCOLAX) 5 MG EC tablet, Take 15 mg by mouth daily as needed for constipation., Disp: , Rfl: ;  cimetidine (TAGAMET) 200 MG tablet, Take 200 mg by mouth as needed., Disp: , Rfl: ;  diltiazem 2 % GEL, Apply 1 application topically 3 (three) times daily., Disp: 30 g, Rfl: 1 fluticasone (FLONASE) 50 MCG/ACT nasal spray, Place 2 sprays into the nose daily., Disp: 16 g, Rfl: 6;  gabapentin (NEURONTIN) 600 MG tablet, Take 1 tablet (600 mg total) by mouth 3 (three) times daily., Disp: 180 tablet, Rfl: 0;  Lactase (DAIRY DIGESTIVE PO), Take by mouth as needed., Disp: , Rfl: ;  peg 3350 powder (MOVIPREP) 100 G SOLR, Take 1 kit (100 g total) by mouth once., Disp: 1 kit, Rfl: 0 pravastatin (PRAVACHOL) 20 MG tablet, Take 1 tablet (  20 mg total) by mouth daily., Disp: 90 tablet, Rfl: 3;  Probiotic Product (PROBIOTIC DAILY PO), Take by mouth as needed., Disp: , Rfl: ;  simethicone (GAS-X) 80 MG chewable tablet, Chew 80 mg by mouth every 6 (six) hours as needed for flatulence., Disp: , Rfl:   EXAM:  Filed Vitals:   07/29/13 1013  BP: 100/72  Temp: 98.3 F (36.8 C)    Body mass index is 20.57 kg/(m^2).  GENERAL: vitals reviewed and listed above, alert, oriented, appears well hydrated and in no acute distress  HEENT: atraumatic, conjunttiva clear, no obvious abnormalities on inspection of external nose and ears  NECK: no obvious masses on inspection  LUNGS: clear to auscultation bilaterally, no wheezes, rales or rhonchi, good air movement  CV: HRRR, no peripheral edema  MS: moves all  extremities without noticeable abnormality  PSYCH: pleasant and cooperative, no obvious depression or anxiety  ASSESSMENT AND PLAN:  Discussed the following assessment and plan:  IBS (irritable bowel syndrome)  Neurodermatitis - Plan: gabapentin (NEURONTIN) 600 MG tablet  Diabetes - Plan: Hemoglobin A1c, Basic metabolic panel  Hyperlipemia - Plan: Lipid Panel  -FASTING LABS -advised lifestyle recs -smoking cessation counseling - she is not ready new - but is considering chantix and cutting back -seeing GI and getting colonoscopy for rectal issues, GI issues -Patient advised to return or notify a doctor immediately if symptoms worsen or persist or new concerns arise.  There are no Patient Instructions on file for this visit.   Erin Basque R.

## 2013-07-30 LAB — LIPID PANEL
Cholesterol: 251 mg/dL — ABNORMAL HIGH (ref 0–200)
HDL: 42.2 mg/dL (ref >39.00)
Total CHOL/HDL Ratio: 6
Triglycerides: 136 mg/dL (ref 0.0–149.0)
VLDL: 27.2 mg/dL (ref 0.0–40.0)

## 2013-07-30 LAB — BASIC METABOLIC PANEL
BUN: 18 mg/dL (ref 6–23)
CO2: 25 mEq/L (ref 19–32)
Calcium: 8.6 mg/dL (ref 8.4–10.5)
Chloride: 111 mEq/L (ref 96–112)
Creatinine, Ser: 0.8 mg/dL (ref 0.4–1.2)
GFR: 83.39 mL/min (ref >60.00)
Glucose, Bld: 85 mg/dL (ref 70–99)
Potassium: 4.8 mEq/L (ref 3.5–5.1)
Sodium: 142 mEq/L (ref 135–145)

## 2013-07-30 LAB — LDL CHOLESTEROL, DIRECT: Direct LDL: 180.4 mg/dL

## 2013-07-30 LAB — HEMOGLOBIN A1C: Hgb A1c MFr Bld: 6.9 % — ABNORMAL HIGH (ref 4.6–6.5)

## 2013-07-31 NOTE — Progress Notes (Signed)
Quick Note:  Left a message for pt to return call. ______ 

## 2013-08-01 NOTE — Progress Notes (Signed)
Quick Note:  Left a message for return call. ______ 

## 2013-08-05 ENCOUNTER — Ambulatory Visit (AMBULATORY_SURGERY_CENTER): Payer: No Typology Code available for payment source | Admitting: Gastroenterology

## 2013-08-05 ENCOUNTER — Encounter: Payer: Self-pay | Admitting: Gastroenterology

## 2013-08-05 ENCOUNTER — Other Ambulatory Visit (INDEPENDENT_AMBULATORY_CARE_PROVIDER_SITE_OTHER): Payer: No Typology Code available for payment source

## 2013-08-05 ENCOUNTER — Telehealth: Payer: Self-pay

## 2013-08-05 ENCOUNTER — Other Ambulatory Visit: Payer: Self-pay | Admitting: Gastroenterology

## 2013-08-05 VITALS — BP 107/46 | HR 68 | Temp 98.3°F | Resp 17 | Ht 63.5 in | Wt 117.0 lb

## 2013-08-05 DIAGNOSIS — R198 Other specified symptoms and signs involving the digestive system and abdomen: Secondary | ICD-10-CM

## 2013-08-05 DIAGNOSIS — K6289 Other specified diseases of anus and rectum: Secondary | ICD-10-CM

## 2013-08-05 DIAGNOSIS — Z86010 Personal history of colonic polyps: Secondary | ICD-10-CM

## 2013-08-05 DIAGNOSIS — D126 Benign neoplasm of colon, unspecified: Secondary | ICD-10-CM

## 2013-08-05 LAB — CBC WITH DIFFERENTIAL/PLATELET
Basophils Relative: 0.4 % (ref 0.0–3.0)
Eosinophils Absolute: 0.1 10*3/uL (ref 0.0–0.7)
Eosinophils Relative: 0.9 % (ref 0.0–5.0)
HCT: 30.7 % — ABNORMAL LOW (ref 36.0–46.0)
Hemoglobin: 10.3 g/dL — ABNORMAL LOW (ref 12.0–15.0)
MCHC: 33.7 g/dL (ref 30.0–36.0)
MCV: 90.2 fl (ref 78.0–100.0)
Monocytes Absolute: 0.6 10*3/uL (ref 0.1–1.0)
Neutro Abs: 4.8 10*3/uL (ref 1.4–7.7)
RBC: 3.4 Mil/uL — ABNORMAL LOW (ref 3.87–5.11)
WBC: 7.8 10*3/uL (ref 4.5–10.5)

## 2013-08-05 LAB — HEPATIC FUNCTION PANEL
Bilirubin, Direct: 0 mg/dL (ref 0.0–0.3)
Total Bilirubin: 0.5 mg/dL (ref 0.3–1.2)

## 2013-08-05 MED ORDER — SODIUM CHLORIDE 0.9 % IV SOLN: 500.0000 mL | INTRAVENOUS | Status: DC

## 2013-08-05 NOTE — Progress Notes (Signed)
Specimens sent out rush per order dr stark

## 2013-08-05 NOTE — Progress Notes (Signed)
Report given to Greer Ee, RN to resume care of pt. Erin [name]

## 2013-08-05 NOTE — Op Note (Signed)
[facility] [address] [place], [address]   COLONOSCOPY PROCEDURE REPORT  PATIENT: Erin [name], Erin [name]  MR#: [id number] BIRTHDATE: 1956/07/27 , 56  yrs. old GENDER: Female ENDOSCOPIST: Meryl Dare, MD, [facility] PROCEDURE DATE:  08/05/2013 PROCEDURE:   Colonoscopy with biopsy and snare polypectomy First Screening Colonoscopy - Avg.  risk and is 50 yrs.  old or older - No.  Prior Negative Screening - Now for repeat screening. N/A  History of Adenoma - Now for follow-up colonoscopy & has been > or = to 3 yrs.  Yes hx of adenoma.  Has been 3 or more years since last colonoscopy.  Polyps Removed Today? Yes. ASA CLASS:   Class II INDICATIONS:Patient's personal history of adenomatous colon polyps and rectal pain. MEDICATIONS: MAC sedation, administered by CRNA and propofol (Diprivan) 250mg  IV DESCRIPTION OF PROCEDURE:   After the risks benefits and alternatives of the procedure were thoroughly explained, informed consent was obtained.  A digital rectal exam revealed a palpable rectal mass.   The LB [id number]  endoscope was introduced through the anus and advanced to the cecum, which was identified by both the appendix and ileocecal valve. No adverse events experienced with a tortuous and redundant colon.  The quality of the prep was excellent, using MoviPrep. The instrument was then slowly withdrawn as the colon was fully examined.  COLON FINDINGS: A one-third circumferential firm mass, measuring 3.5 X 3 cm in size, with friable surfaces was found in rectum seen upon the retroflexed view. It extended to the dentate line. Multiple biopsies of the lesion were performed.   A sessile polyp measuring 6 mm in size was found in the descending colon.  A polypectomy was performed with a cold snare.  The resection was complete and the polyp tissue was completely retrieved. The colon was otherwise normal.  There was no diverticulosis, inflammation, polyps or cancers  unless previously stated.  Retroflexed views as above. The time to cecum=3 minutes 36 seconds.  Withdrawal time=9 minutes 15 seconds.  The scope was withdrawn and the procedure completed.  COMPLICATIONS: There were no complications.  ENDOSCOPIC IMPRESSION: 1.   Rectal mass, measuring 3.5 X 3 cm; multiple biopsies of the lesion performed 2.   Sessile polyp measuring 6 mm in the descending colon; polypectomy performed with a cold snare  RECOMMENDATIONS: 1.  Await pathology results 2.  My office will arrange for you to have a CT scan of abdomen/pelvis, a CBC and LFTs  eSigned:  Meryl Dare, MD, [facility] 08/05/2013 11:50 AM

## 2013-08-05 NOTE — Patient Instructions (Addendum)
YOU HAD AN ENDOSCOPIC PROCEDURE TODAY AT THE [facility]: Refer to the procedure report that was given to you for any specific questions about what was found during the examination.  If the procedure report does not answer your questions, please call your gastroenterologist to clarify.  If you requested that your care partner not be given the details of your procedure findings, then the procedure report has been included in a sealed envelope for you to review at your convenience later.  YOU SHOULD EXPECT: Some feelings of bloating in the abdomen. Passage of more gas than usual.  Walking can help get rid of the air that was put into your GI tract during the procedure and reduce the bloating. If you had a lower endoscopy (such as a colonoscopy or flexible sigmoidoscopy) you may notice spotting of blood in your stool or on the toilet paper. If you underwent a bowel prep for your procedure, then you may not have a normal bowel movement for a few days.  DIET: Your first meal following the procedure should be a light meal and then it is ok to progress to your normal diet.  A half-sandwich or bowl of soup is an example of a good first meal.  Heavy or fried foods are harder to digest and may make you feel nauseous or bloated.  Likewise meals heavy in dairy and vegetables can cause extra gas to form and this can also increase the bloating.  Drink plenty of fluids but you should avoid alcoholic beverages for 24 hours.  ACTIVITY: Your care partner should take you home directly after the procedure.  You should plan to take it easy, moving slowly for the rest of the day.  You can resume normal activity the day after the procedure however you should NOT DRIVE or use heavy machinery for 24 hours (because of the sedation medicines used during the test).    SYMPTOMS TO REPORT IMMEDIATELY: A gastroenterologist can be reached at any hour.  During normal business hours, 8:30 AM to 5:00 PM Monday through Friday,  call [phone].  After hours and on weekends, please call the GI answering service at [phone] who will take a message and have the physician on call contact you.   Following lower endoscopy (colonoscopy or flexible sigmoidoscopy):  Excessive amounts of blood in the stool  Significant tenderness or worsening of abdominal pains  Swelling of the abdomen that is new, acute  Fever of 100F or higher   FOLLOW UP: If any biopsies were taken you will be contacted by phone or by letter within the next 1-3 weeks.  Call your gastroenterologist if you have not heard about the biopsies in 3 weeks.  Our staff will call the home number listed on your records the next business day following your procedure to check on you and address any questions or concerns that you may have at that time regarding the information given to you following your procedure. This is a courtesy call and so if there is no answer at the home number and we have not heard from you through the emergency physician on call, we will assume that you have returned to your regular daily activities without incident.  SIGNATURES/CONFIDENTIALITY: You and/or your care partner have signed paperwork which will be entered into your electronic medical record.  These signatures attest to the fact that that the information above on your After Visit Summary has been reviewed and is understood.  Full responsibility of the confidentiality of   this discharge information lies with you and/or your care-partner.   A handout was handed to your care partner on polyps. The 3rd floor nurse for Dr. Russella Dar will call you to set up to have a CT scan of abdomin/ pelvis, CBC and LFT's (blood work). You may resume your current medications today. Per Dr. Russella Dar discontinue the hemorrhoid medications except the one with lidocaine it still may help with rectal pain.   DR. Russella Dar will call with biopsy results ASAP. Please call if any questions or concerns.

## 2013-08-05 NOTE — Progress Notes (Signed)
Patient did not experience any of the following events: a burn prior to discharge; a fall within the facility; wrong site/side/patient/procedure/implant event; or a hospital transfer or hospital admission upon discharge from the facility. (G8907) Patient did not have preoperative order for IV antibiotic SSI prophylaxis. (G8918)  

## 2013-08-05 NOTE — Progress Notes (Signed)
Called to room to assist during endoscopic procedure.  Patient ID and intended procedure confirmed with present staff. Received instructions for my participation in the procedure from the performing physician.  

## 2013-08-05 NOTE — Telephone Encounter (Signed)
Patient's husband notified of the CT scan abd/pelvis ordered for 08/07/13 2:30.  All questions answered.  They picked up contrast and instructions today at the colonoscopy.

## 2013-08-05 NOTE — Progress Notes (Signed)
  [facility] Anesthesia Post-op Note  Patient: Erin [name]  Procedure(s) Performed: colonoscopy  Patient Location: [facility] - Recovery Area  Anesthesia Type: Deep Sedation/Propofol  Level of Consciousness: awake, oriented and patient cooperative  Airway and Oxygen Therapy: Patient Spontanous Breathing  Post-op Pain: none  Post-op Assessment:  Post-op Vital signs reviewed, Patient's Cardiovascular Status Stable, Respiratory Function Stable, Patent Airway, No signs of Nausea or vomiting and Pain level controlled  Post-op Vital Signs: Reviewed and stable  Complications: No apparent anesthesia complications  Segundo Makela E 11:50 AM

## 2013-08-06 ENCOUNTER — Telehealth: Payer: Self-pay

## 2013-08-06 ENCOUNTER — Ambulatory Visit: Payer: No Typology Code available for payment source

## 2013-08-06 DIAGNOSIS — D509 Iron deficiency anemia, unspecified: Secondary | ICD-10-CM

## 2013-08-06 LAB — IBC PANEL
Saturation Ratios: 8.5 % — ABNORMAL LOW (ref 20.0–50.0)
Transferrin: 285.5 mg/dL (ref 212.0–360.0)

## 2013-08-06 LAB — FOLATE: Folate: >24.8 ng/mL (ref >5.9)

## 2013-08-06 NOTE — Telephone Encounter (Signed)
  Follow up Call-  Call back number 08/05/2013  Post procedure Call Back phone  # [phone]  Permission to leave phone message Yes     Patient questions:  Do you have a fever, pain , or abdominal swelling? no Pain Score  0 *  Have you tolerated food without any problems? yes  Have you been able to return to your normal activities? yes  Do you have any questions about your discharge instructions: Diet   no Medications  no Follow up visit  no  Do you have questions or concerns about your Care? no  Actions: * If pain score is 4 or above: No action needed, pain <4.

## 2013-08-07 ENCOUNTER — Telehealth: Payer: Self-pay | Admitting: Gastroenterology

## 2013-08-07 ENCOUNTER — Other Ambulatory Visit: Payer: Self-pay

## 2013-08-07 ENCOUNTER — Ambulatory Visit (INDEPENDENT_AMBULATORY_CARE_PROVIDER_SITE_OTHER)
Admission: RE | Admit: 2013-08-07 | Discharge: 2013-08-07 | Disposition: A | Discharge status: Home / Self Care | Payer: No Typology Code available for payment source | Source: Ambulatory Visit | Attending: Gastroenterology | Admitting: Gastroenterology

## 2013-08-07 DIAGNOSIS — K6289 Other specified diseases of anus and rectum: Secondary | ICD-10-CM

## 2013-08-07 DIAGNOSIS — R198 Other specified symptoms and signs involving the digestive system and abdomen: Secondary | ICD-10-CM

## 2013-08-07 DIAGNOSIS — C20 Malignant neoplasm of rectum: Secondary | ICD-10-CM

## 2013-08-07 MED ORDER — IOHEXOL 300 MG/ML  SOLN
100.0000 mL | Freq: Once | INTRAMUSCULAR | Status: AC | PRN
  Administered 2013-08-07: 100 mL via INTRAVENOUS

## 2013-08-07 NOTE — Telephone Encounter (Signed)
Called patient with pathology results. Reviewed squamous cell cancer result with her. She is having a CT scan later today and she may need an EUS pending CT report. She will need oncology referral. She understands. She is very appreciative of our care.

## 2013-08-08 ENCOUNTER — Telehealth: Payer: Self-pay | Admitting: Oncology

## 2013-08-08 NOTE — Telephone Encounter (Signed)
I received a voicemail from gina Mariana Kaufman nurse navigator for oncology.  Dr. Truett Perna would like Dr. Russella Dar to consider a repeat biopsy of the mass to see if it is invasive.  They also recommend a radiation oncology referral.  She said if you had questions you could page Dr. Truett Perna at [phone].  Please advise next step

## 2013-08-08 NOTE — Telephone Encounter (Signed)
Pt schedule per Almira Coaster. 09/26 @ 1:30  Welcome packet mailed.

## 2013-08-08 NOTE — Progress Notes (Signed)
Quick Note:  Attempted to call pt; no vm at home number. ______

## 2013-08-09 ENCOUNTER — Other Ambulatory Visit: Payer: Self-pay

## 2013-08-09 DIAGNOSIS — C20 Malignant neoplasm of rectum: Secondary | ICD-10-CM

## 2013-08-09 NOTE — Telephone Encounter (Signed)
Please schedule flex sig next week, during hosp week

## 2013-08-09 NOTE — Telephone Encounter (Signed)
Patient is scheduled for flex on 08/14/13 11:00 at [facility].  She is advised of the instructions for 2 Fleet enema prior to leaving for the hospital, be NPO after midnight and bring a driver.  She verbalized understanding of all.  She once again was very thankful to me and Dr. Russella Dar for her quick treatment.  She is scheduled to see Oncology on 08/22/13.

## 2013-08-10 NOTE — Progress Notes (Signed)
Quick Note:  Pt viewed on mychart. ______

## 2013-08-12 ENCOUNTER — Ambulatory Visit: Payer: No Typology Code available for payment source

## 2013-08-13 ENCOUNTER — Telehealth: Payer: Self-pay | Admitting: *Deleted

## 2013-08-13 NOTE — Telephone Encounter (Signed)
Spoke with patient's husband and gave appointment for 08/22/13 with Dr. Truett Perna.  Explained role of nurse navigator.  Contact names and numbers were provided.

## 2013-08-14 ENCOUNTER — Encounter (HOSPITAL_COMMUNITY): Payer: Self-pay | Admitting: *Deleted

## 2013-08-14 ENCOUNTER — Ambulatory Visit (HOSPITAL_COMMUNITY)
Admission: RE | Admit: 2013-08-14 | Discharge: 2013-08-14 | Disposition: A | Discharge status: Home / Self Care | Payer: No Typology Code available for payment source | Source: Ambulatory Visit | Attending: Gastroenterology | Admitting: Gastroenterology

## 2013-08-14 ENCOUNTER — Encounter (HOSPITAL_COMMUNITY)
Admission: RE | Disposition: A | Discharge status: Home / Self Care | Payer: Self-pay | Source: Ambulatory Visit | Attending: Gastroenterology

## 2013-08-14 DIAGNOSIS — E119 Type 2 diabetes mellitus without complications: Secondary | ICD-10-CM | POA: Insufficient documentation

## 2013-08-14 DIAGNOSIS — C210 Malignant neoplasm of anus, unspecified: Secondary | ICD-10-CM

## 2013-08-14 DIAGNOSIS — K6289 Other specified diseases of anus and rectum: Secondary | ICD-10-CM

## 2013-08-14 DIAGNOSIS — K629 Disease of anus and rectum, unspecified: Secondary | ICD-10-CM

## 2013-08-14 DIAGNOSIS — E785 Hyperlipidemia, unspecified: Secondary | ICD-10-CM | POA: Insufficient documentation

## 2013-08-14 DIAGNOSIS — C20 Malignant neoplasm of rectum: Secondary | ICD-10-CM | POA: Insufficient documentation

## 2013-08-14 DIAGNOSIS — A630 Anogenital (venereal) warts: Secondary | ICD-10-CM | POA: Insufficient documentation

## 2013-08-14 DIAGNOSIS — K589 Irritable bowel syndrome without diarrhea: Secondary | ICD-10-CM | POA: Insufficient documentation

## 2013-08-14 DIAGNOSIS — R198 Other specified symptoms and signs involving the digestive system and abdomen: Secondary | ICD-10-CM

## 2013-08-14 DIAGNOSIS — F17200 Nicotine dependence, unspecified, uncomplicated: Secondary | ICD-10-CM | POA: Insufficient documentation

## 2013-08-14 DIAGNOSIS — L280 Lichen simplex chronicus: Secondary | ICD-10-CM | POA: Insufficient documentation

## 2013-08-14 DIAGNOSIS — C801 Malignant (primary) neoplasm, unspecified: Secondary | ICD-10-CM | POA: Insufficient documentation

## 2013-08-14 HISTORY — DX: Malignant neoplasm of anus, unspecified: C21.0

## 2013-08-14 HISTORY — PX: FLEXIBLE SIGMOIDOSCOPY: SHX5431

## 2013-08-14 SURGERY — SIGMOIDOSCOPY, FLEXIBLE
Anesthesia: Moderate Sedation

## 2013-08-14 MED ORDER — FENTANYL CITRATE 0.05 MG/ML IJ SOLN
INTRAMUSCULAR | Status: AC
  Filled 2013-08-14: qty 2

## 2013-08-14 MED ORDER — MIDAZOLAM HCL 10 MG/2ML IJ SOLN
INTRAMUSCULAR | Status: DC | PRN
  Administered 2013-08-14 (×2): 2 mg via INTRAVENOUS

## 2013-08-14 MED ORDER — MIDAZOLAM HCL 10 MG/2ML IJ SOLN
INTRAMUSCULAR | Status: AC
  Filled 2013-08-14: qty 2

## 2013-08-14 MED ORDER — FENTANYL CITRATE 0.05 MG/ML IJ SOLN
INTRAMUSCULAR | Status: DC | PRN
  Administered 2013-08-14 (×3): 25 ug via INTRAVENOUS

## 2013-08-14 NOTE — Op Note (Signed)
[facility] [address] [place], [address]   FLEXIBLE SIGMOIDOSCOPY PROCEDURE REPORT  PATIENT: Erin [name], Erin [name]  MR#: [id number] BIRTHDATE: 1956/01/15 , 56  yrs. 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 MEDICATIONS:  medications were titrated to patient response per physician's verbal order, Fentanyl 75 mcg IV, and Versed 4 mg IV DESCRIPTION OF PROCEDURE:   After the risks benefits and alternatives of the procedure were thoroughly explained, informed consent was obtained.  revealed an anal condylomata. The endoscope was introduced through the anus  and advanced to the sigmoid colon without limitations.  No adverse events experienced. The quality of the prep was good .  The instrument was then slowly withdrawn as the mucosa was fully examined.    COLON FINDINGS: A one-third circumferential firm mass, measuring 3 X 3.5cm in size, with friable surfaces was found in the anal canal/distal rectum.  Multiple biopsies of the lesion were performed.  The colonic mucosa appeared normal in the proximal rectum and sigmoid colon.  Retroflexed views revealed the mass described above. The scope was then withdrawn from the patient and the procedure completed.  COMPLICATIONS: There were no complications.  ENDOSCOPIC IMPRESSION: 1.   Anal/rectal mass, measuring 3 X 3.5 cm; multiple biopsies were performed 2.   The colonic mucosa appeared normal in the proximal rectum and sigmoid colon  RECOMMENDATIONS: 1.  Await pathology results   eSigned:  Meryl Dare, MD, [facility] 08/14/2013 11:17 AM   CC: Rolm Baptise, MD

## 2013-08-14 NOTE — H&P (View-Only) (Signed)
No chief complaint on file.   HPI:  Follow up:  1)DM - recently diagnosed: -referred to diabetes educator, lifestyle controlled -advised exercise and healthy diet Lab Results  Component Value Date   HGBA1C 6.7* 03/29/2013    2)HLD: -added pravastatin last visit Lab Results  Component Value Date   CHOL 281* 03/29/2013   HDL 40.70 03/29/2013   LDLDIRECT 199.3 03/29/2013   TRIG 178.0* 03/29/2013   CHOLHDL 7 03/29/2013  -fasting today  3)Tobacco use: -not interested in quitting  AR: -using flonase  Chronic headaches/neurodermatitis: -on neurotin  IBS/rectal pain: -sees GI, Dr. Stark - has fissures; rectacare has helped -colonoscopy scheduled  HM: Offered flu and tdap today -scheduled for colonoscopy  ROS: See pertinent positives and negatives per HPI.  Past Medical History  Diagnosis Date  . 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 Chronic daily headache 03/29/2013  . IBS (irritable bowel syndrome) 03/29/2013  . Tubular adenoma of colon 2004  . Anxiety   . Depression   . DM (diabetes mellitus)   . HLD (hyperlipidemia)     Past Surgical History  Procedure Laterality Date  . Ectopic pregnancy surgery    . Pilonidal cyst excision      Family History  Problem Relation Age of Onset  . Arthritis Mother   . Hyperlipidemia Mother   . Heart disease Mother   . Hypertension Mother   . Stroke Mother 81  . Heart disease Father   . Hyperlipidemia Father   . Hypertension Father   . Stroke Father 91  . Lung cancer Brother   . Irritable bowel syndrome Mother   . Thyroid disease Father   . Thyroid disease Mother     History   Social History  . Marital Status: Married    Spouse Name: N/A    Number of Children: 0  . Years of Education: N/A   Occupational History  . cargiver    Social History Main Topics  . Smoking status: Current Every Day Smoker -- 1.00 packs/day    Types: Cigarettes  . Smokeless tobacco: Never Used  . Alcohol Use: No  . Drug Use: No  . Sexual Activity:  Yes    Partners: Male   Other Topics Concern  . None   Social History Narrative   Work or School: homemaker      Home Situation: lives with her husband, takes care of her parents       Spiritual Beliefs: Christian      Lifestyle: no regular exercise, poor diet             Current outpatient prescriptions:Aspirin-Salicylamide-Caffeine (BC HEADACHE POWDER PO), Take by mouth as needed., Disp: , Rfl: ;  bisacodyl (DULCOLAX) 5 MG EC tablet, Take 15 mg by mouth daily as needed for constipation., Disp: , Rfl: ;  cimetidine (TAGAMET) 200 MG tablet, Take 200 mg by mouth as needed., Disp: , Rfl: ;  diltiazem 2 % GEL, Apply 1 application topically 3 (three) times daily., Disp: 30 g, Rfl: 1 fluticasone (FLONASE) 50 MCG/ACT nasal spray, Place 2 sprays into the nose daily., Disp: 16 g, Rfl: 6;  gabapentin (NEURONTIN) 600 MG tablet, Take 1 tablet (600 mg total) by mouth 3 (three) times daily., Disp: 180 tablet, Rfl: 0;  Lactase (DAIRY DIGESTIVE PO), Take by mouth as needed., Disp: , Rfl: ;  peg 3350 powder (MOVIPREP) 100 G SOLR, Take 1 kit (100 g total) by mouth once., Disp: 1 kit, Rfl: 0 pravastatin (PRAVACHOL) 20 MG tablet, Take 1 tablet (  20 mg total) by mouth daily., Disp: 90 tablet, Rfl: 3;  Probiotic Product (PROBIOTIC DAILY PO), Take by mouth as needed., Disp: , Rfl: ;  simethicone (GAS-X) 80 MG chewable tablet, Chew 80 mg by mouth every 6 (six) hours as needed for flatulence., Disp: , Rfl:   EXAM:  Filed Vitals:   07/29/13 1013  BP: 100/72  Temp: 98.3 F (36.8 C)    Body mass index is 20.57 kg/(m^2).  GENERAL: vitals reviewed and listed above, alert, oriented, appears well hydrated and in no acute distress  HEENT: atraumatic, conjunttiva clear, no obvious abnormalities on inspection of external nose and ears  NECK: no obvious masses on inspection  LUNGS: clear to auscultation bilaterally, no wheezes, rales or rhonchi, good air movement  CV: HRRR, no peripheral edema  MS: moves all  extremities without noticeable abnormality  PSYCH: pleasant and cooperative, no obvious depression or anxiety  ASSESSMENT AND PLAN:  Discussed the following assessment and plan:  IBS (irritable bowel syndrome)  Neurodermatitis - Plan: gabapentin (NEURONTIN) 600 MG tablet  Diabetes - Plan: Hemoglobin A1c, Basic metabolic panel  Hyperlipemia - Plan: Lipid Panel  -FASTING LABS -advised lifestyle recs -smoking cessation counseling - she is not ready new - but is considering chantix and cutting back -seeing GI and getting colonoscopy for rectal issues, GI issues -Patient advised to return or notify a doctor immediately if symptoms worsen or persist or new concerns arise.  There are no Patient Instructions on file for this visit.   KIM, HANNAH R.   

## 2013-08-14 NOTE — Interval H&P Note (Signed)
History and Physical Interval Note:  08/14/2013 10:00 AM  Erin [name]  has presented today for surgery, with the diagnosis of gain additional biopsies to determine invasive rectal cancer  The various methods of treatment have been discussed with the patient and family. After consideration of risks, benefits and other options for treatment, the patient has consented to  Procedure(s): FLEXIBLE SIGMOIDOSCOPY (N/A) as a surgical intervention .  The patient's history has been reviewed, patient examined, no change in status, stable for surgery.  I have reviewed the patient's chart and labs.  Questions were answered to the patient's satisfaction.     Venita Lick. Russella Dar MD Clementeen Graham

## 2013-08-15 ENCOUNTER — Encounter (HOSPITAL_COMMUNITY): Payer: Self-pay | Admitting: Gastroenterology

## 2013-08-16 ENCOUNTER — Ambulatory Visit: Payer: No Typology Code available for payment source | Admitting: Oncology

## 2013-08-16 ENCOUNTER — Ambulatory Visit: Payer: No Typology Code available for payment source

## 2013-08-16 ENCOUNTER — Encounter: Payer: Self-pay | Admitting: Radiation Oncology

## 2013-08-16 NOTE — Progress Notes (Signed)
GU Location of Tumor / Histology:  Anal/rectal cancer   Patient presented  3 weeks rectal pain, sharp shooting pain and then throbbing pain  Biopsies of  08/14/13: Rectum, biopsy- INVASIVE SQUAMOUS CELL CARCINOMA ARISING IN THE BACKGROUND OF EXTENSIVE HIGHGRADE SQUAMOUS DYSPLASIA/IN SITU CARCINOMA, SEE COMMENT. Microscopic Comment:Slide sections demonstrate extensive high grade dysplasia/in situ carcinoma that in some tissue  Past/Anticipated interventions by urology, if any:  Past/Anticipated interventions by medical oncology, if any: appt Dr.Sherrill 08/22/13 at 2pm Weight changes, if any:  Bowel/Bladder complaints, if any: chronic constipation many years, takes Ducolax daily per Dr.Stark  Recommends daily sitz baths, diltiazem 2% cream tid for 8 weeks, stool softner daily,   Nausea/Vomiting, if any:no  Pain issues, if any:  Rectal pain, took 5 aleve this am at one time, a 4 now on 1-10 scale, using recticare lidocaine 5% cream prn OTC, with movement sharp pain SAFETY ISSUES:  Prior radiation? no  Pacemaker/ICD? no  Possible current pregnancy? no  Is the patient on methotrexate? no  Current Complaints / other details:  Married no children, 1 stepdaughter,2 step grandsons,  Hx Adenomatous  Colon polyps dx 2004,didn't return  For surveillance colonoscopy due 2009,hx IBS,chronic, smoker,1ppd cigarettes, no alcohol or illicit drugs Chronic head aches, Neurodermatitis, brother lung cancer, mother heart disease/stroke, father heart disease

## 2013-08-19 ENCOUNTER — Ambulatory Visit
Admission: RE | Admit: 2013-08-19 | Discharge: 2013-08-19 | Disposition: A | Discharge status: Home / Self Care | Payer: No Typology Code available for payment source | Source: Ambulatory Visit | Attending: Radiation Oncology | Admitting: Radiation Oncology

## 2013-08-19 ENCOUNTER — Telehealth: Payer: Self-pay | Admitting: *Deleted

## 2013-08-19 ENCOUNTER — Encounter: Payer: Self-pay | Admitting: Radiation Oncology

## 2013-08-19 VITALS — BP 126/73 | HR 79 | Temp 97.9°F | Resp 20 | Ht 63.5 in | Wt 116.4 lb

## 2013-08-19 DIAGNOSIS — I700 Atherosclerosis of aorta: Secondary | ICD-10-CM | POA: Insufficient documentation

## 2013-08-19 DIAGNOSIS — L280 Lichen simplex chronicus: Secondary | ICD-10-CM | POA: Insufficient documentation

## 2013-08-19 DIAGNOSIS — C210 Malignant neoplasm of anus, unspecified: Secondary | ICD-10-CM | POA: Insufficient documentation

## 2013-08-19 DIAGNOSIS — K6289 Other specified diseases of anus and rectum: Secondary | ICD-10-CM

## 2013-08-19 DIAGNOSIS — E785 Hyperlipidemia, unspecified: Secondary | ICD-10-CM | POA: Insufficient documentation

## 2013-08-19 DIAGNOSIS — Z79899 Other long term (current) drug therapy: Secondary | ICD-10-CM | POA: Insufficient documentation

## 2013-08-19 DIAGNOSIS — E119 Type 2 diabetes mellitus without complications: Secondary | ICD-10-CM | POA: Insufficient documentation

## 2013-08-19 DIAGNOSIS — K589 Irritable bowel syndrome without diarrhea: Secondary | ICD-10-CM | POA: Insufficient documentation

## 2013-08-19 DIAGNOSIS — K629 Disease of anus and rectum, unspecified: Secondary | ICD-10-CM

## 2013-08-19 HISTORY — DX: Other chronic pain: G89.29

## 2013-08-19 HISTORY — DX: Nonscarring hair loss, unspecified: L65.9

## 2013-08-19 HISTORY — DX: Other chronic pain: M54.9

## 2013-08-19 HISTORY — DX: Flushing: R23.2

## 2013-08-19 MED ORDER — OXYCODONE-ACETAMINOPHEN 5-325 MG PO TABS: 1.0000 | ORAL_TABLET | ORAL | Status: DC | PRN

## 2013-08-19 NOTE — Progress Notes (Signed)
Radiation Oncology         [phone] ________________________________  Name: Erin [name] MRN: [id number]  Date: 08/19/2013  DOB: 02/28/1956  CC:KIM, Damita Lack., DO  Ladene Artist, MD     REFERRING PHYSICIAN: Ladene Artist, MD   DIAGNOSIS: Squamous cell carcinoma of the anal canal   HISTORY OF PRESENT ILLNESS::Erin [name] is a 57 y.o. female who is seen for an initial consultation visit. The patient describes a history of some rectal pain. The patient describes her pain as an aching pain which also is sharp at times. Such activities his bowel movements increase the pain. She states that she began experiencing this pain approximately 2 months ago. She notes that she did have some bleeding a number of weeks ago but none to any significant degree recently. The patient has been using Aleve greater than the recommended dose for pain control and also was using 5% lidocaine cream.  Evaluation of the patient has revealed a palpable anal/rectal tumor. A colonoscopy revealed a rectal mass measuring 3.5 x 3 cm and multiple biopsies were obtained. The initial biopsy revealed at least squamous cell carcinoma in situ. The patient then proceeded to undergo a sigmoidoscopy with repeat biopsy. A biopsy was performed and the pathology revealed invasive squamous cell carcinoma.  CT imaging for staging purposes was completed on 08/08/1999 413 which corresponded to a CT scan of the abdomen and pelvis. The known rectal mass was not well visualized on CT scan. No evidence of metastatic disease seen within the abdomen or pelvis.. A PET scan has not been ordered thus far.   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     PAST SURGICAL HISTORY: Past Surgical History  Procedure Laterality Date  . Ectopic pregnancy surgery    . Pilonidal cyst excision    . Colonoscopy    . Flexible sigmoidoscopy N/A 08/14/2013    Procedure: FLEXIBLE SIGMOIDOSCOPY;  Surgeon: Meryl Dare, MD;  Location: [facility];  Service: Endoscopy;  Laterality: N/A;     FAMILY HISTORY: family history includes Arthritis in her mother; Heart disease in her father and mother; Hyperlipidemia in her father and mother; Hypertension in her father and mother; Irritable bowel syndrome in her mother; Lung cancer in her brother; Stroke (age of onset: 70) in her mother; Stroke (age of onset: 12) in her father; Thyroid disease in her father and mother.   SOCIAL HISTORY:  reports that she has been smoking Cigarettes.  She has been smoking about 1.00 pack per day. She has never used smokeless tobacco. She reports that she does not drink alcohol or use illicit drugs.   ALLERGIES: Claritin; Prednisone; and Sulfa antibiotics   MEDICATIONS:  Current Outpatient Prescriptions  Medication Sig Dispense Refill  . Aspirin-Salicylamide-Caffeine (BC HEADACHE POWDER PO) Take by mouth as needed.      . cimetidine (TAGAMET) 200 MG tablet Take 200 mg by mouth as needed.      . fluticasone (FLONASE) 50 MCG/ACT nasal spray Place 2 sprays into the nose daily.  16 g  6  . gabapentin (NEURONTIN) 600 MG tablet Take 1 tablet (600 mg total) by mouth 3 (three) times daily.  180 tablet  0  . Lactase (DAIRY  DIGESTIVE PO) Take by mouth as needed.      . naproxen sodium (ANAPROX) 220 MG tablet Take 220 mg by mouth 3 (three) times daily with meals.      Marland Kitchen OVER THE COUNTER MEDICATION Apply 1 application topically 3 (three) times daily as needed.      . pravastatin (PRAVACHOL) 20 MG tablet Take 1 tablet (20 mg total) by mouth daily.  90 tablet  3  . Probiotic Product (PROBIOTIC DAILY PO)  Take by mouth as needed.      . bisacodyl (DULCOLAX) 5 MG EC tablet Take 15 mg by mouth daily as needed for constipation.      . simethicone (GAS-X) 80 MG chewable tablet Chew 80 mg by mouth every 6 (six) hours as needed for flatulence.       No current facility-administered medications for this encounter.     REVIEW OF SYSTEMS:  A 15 point review of systems is documented in the electronic medical record. This was obtained by the nursing staff. However, I reviewed this with the patient to discuss relevant findings and make appropriate changes.  Pertinent items are noted in HPI.    PHYSICAL EXAM:  height is 5' 3.5" (1.613 m) and weight is 116 lb 6.4 oz (52.799 kg). Her oral temperature is 97.9 F (36.6 C). Her blood pressure is 126/73 and her pulse is 79. Her respiration is 20.   General: Well-developed, in no acute distress HEENT: Normocephalic, atraumatic; oral cavity clear Neck: Supple without any lymphadenopathy Cardiovascular: Regular rate and rhythm Respiratory: Clear to auscultation bilaterally GI: Soft, nontender, normal bowel sounds Extremities: No edema present Neuro: No focal deficits Lymphadenopathy: No inguinal adenopathy present Rectal exam:  No external lesions in the perianal region. Approximately within the anal canal is a palpable mass greater posteriorly and somewhat more laterally to the left. No significant blood on exam glove.  LABORATORY DATA:  Lab Results  Component Value Date   WBC 7.8 08/05/2013   HGB 10.3* 08/05/2013   HCT 30.7* 08/05/2013   MCV 90.2 08/05/2013   PLT 334.0 08/05/2013   Lab Results  Component Value Date   NA 142 07/30/2013   K 4.8 07/30/2013   CL 111 07/30/2013   CO2 25 07/30/2013   Lab Results  Component Value Date   ALT 8 08/05/2013   AST 16 08/05/2013   ALKPHOS 50 08/05/2013   BILITOT 0.5 08/05/2013      RADIOGRAPHY: Ct Abdomen Pelvis W Contrast  08/07/2013   CLINICAL DATA:  Rectal pain/bleeding, newly diagnosed rectal mass  EXAM: CT  ABDOMEN AND PELVIS WITH CONTRAST  TECHNIQUE: Multidetector CT imaging of the abdomen and pelvis was performed using the standard protocol following bolus administration of intravenous contrast.  CONTRAST:  OMNIPAQUE IOHEXOL 300 MG/ML  SOLN  COMPARISON:  None.  FINDINGS: Lung bases are clear.  Liver, spleen, pancreas, and adrenal glands are within normal limits.  Gallbladder is unremarkable. No intrahepatic or extrahepatic ductal dilatation.  Kidneys are within normal limits.  No hydronephrosis.  No evidence of bowel obstruction. Normal appendix. Known rectal mass is not well visualized on CT. Possible mild fullness in the anorectal region (series 2/image 78), equivocal.  Atherosclerotic calcifications of the abdominal aorta and branch vessels. Fusiform ectasia of the infrarenal abdominal aorta measuring up to 1.9 x 1.7 cm (series 2/image 28).  No abdominopelvic ascites.  No suspicious abdominopelvic lymphadenopathy.  Uterus and bilateral ovaries are unremarkable.  Bladder is within normal limits.  Mild degenerative changes  of the visualized thoracolumbar spine.  IMPRESSION: Known rectal mass is not well visualized on CT. Possible mild fullness in the anorectal region, equivocal.  No evidence of metastatic disease in the abdomen/pelvis.   Electronically Signed   By: Charline Bills M.D.   On: 08/07/2013 16:08       IMPRESSION: The patient has a diagnosis of squamous cell carcinoma of the anus. Staging studies at this time reveal a T2, N0, M0 invasive squamous cell carcinoma. Further workup includes a PET scan which I have ordered today. The patient is also seeing Dr. Truett Perna for evaluation and lab work..  Notable aspects of the case include a tumor which is quite proximal within the anal canal. This originally appeared to represent a possible distal rectal tumor but the histology is consistent with an anal cancer and it was discussed at GI tumor Board that the patient would be treated on this basis. No  regional/distant disease seen at this time.  Based on the current information available, the patient appears to be an appropriate candidate for definitve chemoradiotherapy. I therefore discussed a potential 5 1/2 - 6 week course of radiation with the patient. We discussed the rationale of this treatment, including the expected potential benefit. We also discussed the potential side effects and risks of such a treatment as well. All of her questions were answered.   PLAN: Simulation in the near future such that we can proceed with treatment planning. At this time, I anticipate beginning the patient's treatment on 09/02/2013.     I spent 60 minutes face to face with the patient and more than 50% of that time was spent in counseling and/or coordination of care.    ________________________________   Radene Gunning, MD, PhD

## 2013-08-19 NOTE — Telephone Encounter (Signed)
Called patient to inform of test for 08-29-13, spoke with patient and she is aware of this test

## 2013-08-19 NOTE — Progress Notes (Signed)
Please see the Nurse Progress Note in the MD Initial Consult Encounter for this patient. 

## 2013-08-21 NOTE — Progress Notes (Signed)
[facility] Psychosocial Distress Screening Clinical Social Work  Clinical Social Work Intern was referred by distress screening protocol.  The patient scored a 8 on the Psychosocial Distress Thermometer which indicates severe distress. Clinical Social Worker Intern telephoned to assess for distress and other psychosocial needs. The gentleman that answered the phone reported that Patient was not at home at the time but he would have her return my call if Patient felt it was necessary.  He also conveyed that Patient has an appointment scheduled for tomorrow.   Clinical Social Worker Intern follow up needed: no  If yes, follow up plan:   Luana Tatro S. [facility] Clinical Social Work Intern [employer] [facility] [phone]

## 2013-08-22 ENCOUNTER — Ambulatory Visit (HOSPITAL_BASED_OUTPATIENT_CLINIC_OR_DEPARTMENT_OTHER): Payer: No Typology Code available for payment source | Admitting: Oncology

## 2013-08-22 ENCOUNTER — Ambulatory Visit: Payer: PRIVATE HEALTH INSURANCE | Admitting: Nutrition

## 2013-08-22 ENCOUNTER — Telehealth: Payer: Self-pay | Admitting: Oncology

## 2013-08-22 ENCOUNTER — Encounter: Payer: Self-pay | Admitting: Oncology

## 2013-08-22 ENCOUNTER — Ambulatory Visit (HOSPITAL_BASED_OUTPATIENT_CLINIC_OR_DEPARTMENT_OTHER): Payer: PRIVATE HEALTH INSURANCE

## 2013-08-22 VITALS — BP 127/95 | HR 76 | Temp 97.5°F | Resp 18 | Ht 63.0 in | Wt 118.4 lb

## 2013-08-22 DIAGNOSIS — F17200 Nicotine dependence, unspecified, uncomplicated: Secondary | ICD-10-CM

## 2013-08-22 DIAGNOSIS — E119 Type 2 diabetes mellitus without complications: Secondary | ICD-10-CM

## 2013-08-22 DIAGNOSIS — C210 Malignant neoplasm of anus, unspecified: Secondary | ICD-10-CM

## 2013-08-22 DIAGNOSIS — G893 Neoplasm related pain (acute) (chronic): Secondary | ICD-10-CM

## 2013-08-22 DIAGNOSIS — C211 Malignant neoplasm of anal canal: Secondary | ICD-10-CM

## 2013-08-22 DIAGNOSIS — E538 Deficiency of other specified B group vitamins: Secondary | ICD-10-CM

## 2013-08-22 DIAGNOSIS — D509 Iron deficiency anemia, unspecified: Secondary | ICD-10-CM

## 2013-08-22 NOTE — Progress Notes (Signed)
Met with Erin [name] and family. Explained role of nurse navigator. Educational information provided on anal cancer  She was given smoking cessation information and was encouraged to stop smoking.  She is aware that smoking may exacerbate side effects from treatments.  Referral made to dietician for diet education and she was seen during this visit. [facility] resources provided to patient, including SW service information.  She stated she had already been contacted by SW by phone.  Contact names and phone numbers were provided for entire [facility] team.  No barriers to care identified.  Will continue to follow as needed.

## 2013-08-22 NOTE — Progress Notes (Signed)
This patient is a 57 year old female diagnosed with anal cancer.  She is a patient of Dr. Truett Perna.  Past medical history includes IBS, anxiety, depression, hyperlipidemia, chronic back pain, and diabetes.  Medications include lactase, Gas-X, probiotic, and Dulcolax.  Labs include a hemoglobin A1c of 6.9.  Height: 63.5 inches. Weight: 118.4 pounds. Usual body weight 115-120 pounds. BMI 20.98.  Patient is newly diagnosed and will be receiving chemoradiation therapy.  Patient does not typically eat by schedule.  She eats only when she feels hungry.  She is within her usual body weight.  She does not want to consider using oral nutrition supplements such as ensure or boost but did agree to try [employer] essentials.  Nutrition diagnosis: Food and nutrition related knowledge deficit related to new diagnosis of anal cancer and associated treatments as evidenced by no prior need for nutrition related information.  Intervention: Patient was educated on the importance of small, frequent meals and snacks throughout the day, including high-protein foods.  She was educated on food sources of protein and given examples of snacks.  Patient was encouraged to sample [employer] breakfast essentials and include one to 2 servings daily.  She was provided with fact sheets and recipes along with coupons.  Questions were answered.  Teach back method used.  Monitoring, evaluation, goals: Patient will tolerate adequate calories and protein to promote weight maintenance.  Next visit: I will attempt to follow patient during chemotherapy.

## 2013-08-22 NOTE — Progress Notes (Signed)
[facility] New Patient Consult   Referring MD: Aliahna Statzer 57 y.o.  [birth year]    Reason for Referral: Anal cancer     HPI: She reports a 2-1/2 month history of rectal pain, constipation, and bleeding. She was referred to Dr. start and was taken to a colonoscopy on 08/05/2013. A mass was palpated in the rectum. The mass extended to the dentate line. Multiple biopsies were performed. A sessile polyp was found in the descending colon. A polyp was removed. The colon was otherwise normal. The pathology [id number]) confirmed a tubular adenoma at the descending colon. The rectum biopsy revealed at least squamous cell carcinoma in situ. There was an area suspicious for microscopic invasion.  She was taken a repeat sigmoidoscopy on 08/14/2013. The mass at the anal canal/distal rectum was again confirmed. Multiple biopsies were obtained. The pathology [id number]) confirmed invasive squamous cell carcinoma arising in a background of high-grade squamous dysplasia.  She was referred for CTs of the abdomen and pelvis on 08/07/2013. The lung bases were clear. The liver and adrenal glands appeared normal the the rectal mass was not visualized on CT. No suspicious abdominal or pelvic lymphadenopathy.  Erin [name] saw Dr. Mitzi Hansen and is scheduled to undergo radiation simulation on 08/23/2013.  Past Medical History  Diagnosis Date  . 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 Chronic daily headache 03/29/2013    takes bc powder  . IBS (irritable bowel syndrome) 03/29/2013  . Anxiety   . Depression   . HLD (hyperlipidemia)   .  allergies    . Blood transfusion without reported diagnosis     AS A CHILD  . Cancer 08/14/13    invasive squamous cell ca   . Tubular adenoma of colon 09/08/2003  . Chronic back pain   . Hot flashes   . Alopecia   . DM (diabetes mellitus)    .   Irritable bowel syndrome  .   One ectopic pregnancy  Past Surgical History    Procedure Laterality Date  . Ectopic pregnancy surgery    . Pilonidal cyst excision   1970 s  . Colonoscopy   08/05/2013   . Flexible sigmoidoscopy N/A 08/14/2013    Procedure: FLEXIBLE SIGMOIDOSCOPY;  Surgeon: Meryl Dare, MD;  Location: [facility];  Service: Endoscopy;  Laterality: N/A;    Family History  Problem Relation Age of Onset  . Arthritis Mother   . Hyperlipidemia Mother   . Heart disease Mother   . Hypertension Mother   . Stroke Mother 57  . Heart disease Father   . Hyperlipidemia Father   . Hypertension Father   . Stroke Father 93  . Lung cancer Brother   . Irritable bowel syndrome Mother   . Thyroid disease Father   . Thyroid disease Mother     .   Prostate cancer                                                 father  Current outpatient prescriptions:Aspirin-Salicylamide-Caffeine (BC HEADACHE POWDER PO), Take by mouth as needed., Disp: , Rfl: ;  bisacodyl (DULCOLAX) 5 MG EC tablet, Take 15 mg by mouth daily as needed for constipation., Disp: , Rfl: ;  cimetidine (TAGAMET) 200 MG tablet, Take 200 mg by mouth as needed., Disp: , Rfl: ;  docusate sodium (COLACE) 100 MG capsule, Take 100 mg by mouth 2 (two) times daily., Disp: , Rfl:  ferrous sulfate 325 (65 FE) MG tablet, Take 325 mg by mouth 2 (two) times daily., Disp: , Rfl: ;  fluticasone (FLONASE) 50 MCG/ACT nasal spray, Place 2 sprays into the nose daily., Disp: 16 g, Rfl: 6;  gabapentin (NEURONTIN) 600 MG tablet, Take 1 tablet (600 mg total) by mouth 3 (three) times daily., Disp: 180 tablet, Rfl: 0;  OVER THE COUNTER MEDICATION, Apply 1 application topically 3 (three) times daily as needed (RectiCare). , Disp: , Rfl:  oxyCODONE-acetaminophen (PERCOCET/ROXICET) 5-325 MG per tablet, Take 1 tablet by mouth every 4 (four) hours as needed for pain. May take 2 tablets if needed every 4-6 hours., Disp: 60 tablet, Rfl: 0;  pravastatin (PRAVACHOL) 20 MG tablet, Take 1 tablet (20 mg total) by mouth daily., Disp: 90 tablet,  Rfl: 3;  Probiotic Product (PROBIOTIC DAILY PO), Take by mouth daily. , Disp: , Rfl:  vitamin B-12 (CYANOCOBALAMIN) 1000 MCG tablet, Place 1,000 mcg under the tongue daily., Disp: , Rfl: ;  vitamin E 1000 UNIT capsule, Take 1,000 Units by mouth daily., Disp: , Rfl: ;  simethicone (GAS-X) 80 MG chewable tablet, Chew 80 mg by mouth every 6 (six) hours as needed for flatulence., Disp: , Rfl:   Allergies:  Allergies  Allergen Reactions  . Claritin [Loratadine]     Hot flashes  . Sulfa Antibiotics Other (See Comments)    GI distress-pain  . Prednisone Rash    Social History: She lives in [place]. 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 She Smokes Approximately One Pack of Cigarettes per Day. No Alcohol Use. She Drank Alcohol on Social Occasions in the Past. She Was Transfused As a Child. No Risk Factor for HIV or Hepatitis.   ROS:   Positives include: Night sweats/hot flashes for the past 10 years, constipation relieved with Dulcolax and a stool softener, bleeding after straining for a bowel movement, pain at the rectum-improved with "recticare ", she recently lost a tooth  A complete ROS was otherwise negative.  Physical Exam:  Blood pressure 127/95, pulse 76, temperature 97.5 F (36.4 C), temperature source Oral, resp. rate 18, height 5\' 3"  (1.6 m), weight 118 lb 6.4 oz (53.706 kg).  HEENT: Oropharynx without visible mass, neck without mass Lungs: Clear bilaterally Cardiac: Regular rate and rhythm Abdomen: No hepatosplenomegaly, nontender, no mass  Rectal: No visible tumor at the anal verge, normal sphincter tone, palpable mass at the anterior anal canal extending into the rectum Vascular: No leg edema Lymph nodes: No cervical, supraclavicular, axillary, or inguinal nodes Neurologic: Alert and oriented, the motor exam appears grossly intact Skin: 2-3 mm superficial ulceration at the upper gluteal fold, scar from the pilonidal cyst surgery, he pad scar at the lower  back Musculoskeletal: No spine tenderness   LAB:  CBC  Lab Results  Component Value Date   WBC 7.8 08/05/2013   HGB 10.3* 08/05/2013   HCT 30.7* 08/05/2013   MCV 90.2 08/05/2013   PLT 334.0 08/05/2013   ANC 4.8  CMP      Component Value Date/Time   NA 142 07/30/2013 0903   K 4.8 07/30/2013 0903   CL 111 07/30/2013 0903   CO2 25 07/30/2013 0903   GLUCOSE 85 07/30/2013 0903   BUN 18 07/30/2013 0903   CREATININE 0.8 07/30/2013 0903   CALCIUM 8.6 07/30/2013 0903   PROT 5.9* 08/05/2013 1247   ALBUMIN 3.3* 08/05/2013  1247   AST 16 08/05/2013 1247   ALT 8 08/05/2013 1247   ALKPHOS 50 08/05/2013 1247   BILITOT 0.5 08/05/2013 1247   Vitamin B12 187 Ferritin 3.5  Radiology: As per history of present illness    Assessment/Plan:   1. Squamous cell carcinoma of the anus, clinical stage II (T2 N0)-status post an endoscopic biopsy on 08/14/2013 confirming  invasive squamous cell carcinoma  2. Rectal pain, constipation, and bleeding secondary to #1  3. Iron deficiency anemia  4. Vitamin B12 deficiency  5. Diabetes  6. Hyperlipidemia  7. Neurodermatitis  8. Ongoing tobacco use-she was given smoking cessation materials today and we recommended she followup with Dr. Selena Batten to discuss medical therapy for smoking cessation   Disposition:   Erin [name] has been diagnosed with squamous cell carcinoma of the anal canal. She appears to have localized disease based on review of the history, physical exam, and staging CT scans. I discussed treatment of anal cancer with Erin [name] and her husband. She has been evaluated by Dr. Mitzi Hansen and is scheduled to undergo radiation simulation on 08/23/2013. I recommend concurrent 5-FU/mitomycin-C and radiation.  We discussed the potential toxicities associated with the 5-FU/mitomycin-C chemotherapy regimen including the chance for nausea/vomiting, mucositis, diarrhea, alopecia, and hematologic toxicity. We reviewed the hemolytic uremic syndrome associated with  mitomycin-C. We discussed the hand/foot syndrome and hyperpigmentation seen with 5-fluorouracil. We also reviewed the expected skin toxicity with chemotherapy and radiation. She will attend chemotherapy teaching class.  I strongly encouraged her to discontinue smoking, especially during the above treatment with the hope of limiting skin toxicity.  We scheduled her for placement of a PICC and the first cycle of chemotherapy on 09/02/2013.  Erin [name] will return for a chemotherapy teaching class and baseline laboratory studies to include an HIV serology on 08/29/2013.  She is scheduled for an office visit on 09/12/2013.  Approximately 60 minutes were spent with Erin [name] today. The majority of the time was used for counseling and coordination of care.  Ayomide Zuleta 08/22/2013, 3:36 PM

## 2013-08-22 NOTE — Progress Notes (Signed)
[facility] Psychosocial Distress Screening Clinical Social Work  Clinical Social Work Intern was referred by distress screening protocol.  The patient scored a 9.  Clinical Social Work Intern left a message and Patient returned call.  Patient reported that she had some pain issues that had been addressed by her doctor and were now under control.  Patient also reported she had "appointments scheduled today with her doctor and nutritionist".  Clinical Social Work Intern informed Patient of resources available and encourage follow up if desired.  Patient conveyed her appreciation for the call.   Clinical Social Worker follow up needed: no  If yes, follow up plan:   Hind Chesler S. [facility] Clinical Social Work Intern [employer] [facility] [phone]

## 2013-08-22 NOTE — Telephone Encounter (Signed)
gv and printed appt sched and avs for pt for OCT...lvm for Tina to add line...emailed MW to add tx.

## 2013-08-22 NOTE — Telephone Encounter (Signed)
gv and printed appt sched and avs for pt for OC...lvm for Tina to add line....emailed MW to add tx.

## 2013-08-23 ENCOUNTER — Telehealth: Payer: Self-pay | Admitting: Family Medicine

## 2013-08-23 ENCOUNTER — Telehealth: Payer: Self-pay | Admitting: *Deleted

## 2013-08-23 ENCOUNTER — Telehealth: Payer: Self-pay | Admitting: Oncology

## 2013-08-23 ENCOUNTER — Ambulatory Visit
Admission: RE | Admit: 2013-08-23 | Discharge: 2013-08-23 | Disposition: A | Discharge status: Home / Self Care | Payer: No Typology Code available for payment source | Source: Ambulatory Visit | Attending: Radiation Oncology | Admitting: Radiation Oncology

## 2013-08-23 DIAGNOSIS — R5381 Other malaise: Secondary | ICD-10-CM | POA: Insufficient documentation

## 2013-08-23 DIAGNOSIS — R21 Rash and other nonspecific skin eruption: Secondary | ICD-10-CM | POA: Insufficient documentation

## 2013-08-23 DIAGNOSIS — Z510 Encounter for antineoplastic radiation therapy: Secondary | ICD-10-CM | POA: Insufficient documentation

## 2013-08-23 DIAGNOSIS — C210 Malignant neoplasm of anus, unspecified: Secondary | ICD-10-CM | POA: Insufficient documentation

## 2013-08-23 DIAGNOSIS — K625 Hemorrhage of anus and rectum: Secondary | ICD-10-CM | POA: Insufficient documentation

## 2013-08-23 DIAGNOSIS — K5900 Constipation, unspecified: Secondary | ICD-10-CM | POA: Insufficient documentation

## 2013-08-23 DIAGNOSIS — R110 Nausea: Secondary | ICD-10-CM | POA: Insufficient documentation

## 2013-08-23 DIAGNOSIS — E860 Dehydration: Secondary | ICD-10-CM | POA: Insufficient documentation

## 2013-08-23 DIAGNOSIS — N898 Other specified noninflammatory disorders of vagina: Secondary | ICD-10-CM | POA: Insufficient documentation

## 2013-08-23 DIAGNOSIS — K121 Other forms of stomatitis: Secondary | ICD-10-CM | POA: Insufficient documentation

## 2013-08-23 DIAGNOSIS — R51 Headache: Secondary | ICD-10-CM | POA: Insufficient documentation

## 2013-08-23 MED ORDER — VARENICLINE TARTRATE 0.5 MG X 11 & 1 MG X 42 PO MISC: ORAL | Status: DC

## 2013-08-23 MED ORDER — VARENICLINE TARTRATE 1 MG PO TABS: 1.0000 mg | ORAL_TABLET | Freq: Two times a day (BID) | ORAL | Status: DC

## 2013-08-23 NOTE — Telephone Encounter (Signed)
Rx sent to pharmacy   

## 2013-08-23 NOTE — Addendum Note (Signed)
Addended by: Azucena Freed on: 08/23/2013 08:56 AM   Modules accepted: Orders

## 2013-08-23 NOTE — Telephone Encounter (Signed)
s.w. pt and confirmed all appts...s/w tina yester and she is going to call pt with d/t for appt

## 2013-08-23 NOTE — Telephone Encounter (Signed)
Pt has been dx w/ cancer and has to stop smoking. Pt would like a rx for chantix. [facility]/ [place] Pt leaving for treatment at 9:30 am.

## 2013-08-23 NOTE — Telephone Encounter (Signed)
Per staff message and POF I have scheduled appts.  JMW  

## 2013-08-28 ENCOUNTER — Telehealth: Payer: Self-pay | Admitting: *Deleted

## 2013-08-28 ENCOUNTER — Other Ambulatory Visit: Payer: Self-pay | Admitting: *Deleted

## 2013-08-28 ENCOUNTER — Telehealth: Payer: Self-pay | Admitting: Oncology

## 2013-08-28 NOTE — Telephone Encounter (Signed)
Called [facility],spoke with radiation therapist and asked to clarify if patient is starting on the 13th of October or the 20TH? It is still showing starting 09/02/13 at 820 am,"NO, her appt time has been pushed back to 09/09/13 per Dr.moody wants to have results of her pet scan back first" per Philipp Deputy, radiation therapist,; "I haven't made a journal note as yet", thanked claudia, returned call left on my answering machine  While I was at [facility], I spoke with Tonya,RN , and gave her that information 1:12 PM

## 2013-08-28 NOTE — Telephone Encounter (Signed)
Per Dr. Truett Perna, please move PICC and infusion date with pump DC out to start on 09/09/13 instead of 09/02/13.  Move appointment with Misty Stanley from 10/23 to 10/29.  POF sent to scheduler with requests.

## 2013-08-28 NOTE — Telephone Encounter (Signed)
Talked to pt's husband and gave him all the changes , he will get calendar tomorrow after chemo class, emailed Marcelino Duster regarding chenges in chemo appts

## 2013-08-29 ENCOUNTER — Encounter (HOSPITAL_COMMUNITY)
Admission: RE | Admit: 2013-08-29 | Discharge: 2013-08-29 | Disposition: A | Discharge status: Home / Self Care | Payer: No Typology Code available for payment source | Source: Ambulatory Visit | Attending: Radiation Oncology | Admitting: Radiation Oncology

## 2013-08-29 ENCOUNTER — Other Ambulatory Visit: Payer: PRIVATE HEALTH INSURANCE

## 2013-08-29 ENCOUNTER — Other Ambulatory Visit (HOSPITAL_BASED_OUTPATIENT_CLINIC_OR_DEPARTMENT_OTHER): Payer: No Typology Code available for payment source | Admitting: Lab

## 2013-08-29 ENCOUNTER — Encounter: Payer: Self-pay | Admitting: *Deleted

## 2013-08-29 ENCOUNTER — Telehealth: Payer: Self-pay | Admitting: *Deleted

## 2013-08-29 ENCOUNTER — Encounter (HOSPITAL_COMMUNITY): Payer: Self-pay

## 2013-08-29 DIAGNOSIS — C210 Malignant neoplasm of anus, unspecified: Secondary | ICD-10-CM | POA: Insufficient documentation

## 2013-08-29 LAB — COMPREHENSIVE METABOLIC PANEL (CC13)
Alkaline Phosphatase: 75 U/L (ref 40–150)
Anion Gap: 8 mEq/L (ref 3–11)
BUN: 13.2 mg/dL (ref 7.0–26.0)
CO2: 27 mEq/L (ref 22–29)
Calcium: 9.4 mg/dL (ref 8.4–10.4)
Creatinine: 0.8 mg/dL (ref 0.6–1.1)
Glucose: 89 mg/dl (ref 70–140)
Total Bilirubin: 0.22 mg/dL (ref 0.20–1.20)
Total Protein: 7.3 g/dL (ref 6.4–8.3)

## 2013-08-29 LAB — CBC WITH DIFFERENTIAL/PLATELET
Basophils Absolute: 0.1 10*3/uL (ref 0.0–0.1)
Eosinophils Absolute: 0.1 10*3/uL (ref 0.0–0.5)
HCT: 37.8 % (ref 34.8–46.6)
LYMPH%: 13.6 % — ABNORMAL LOW (ref 14.0–49.7)
MCH: 30.8 pg (ref 25.1–34.0)
MCV: 92.1 fL (ref 79.5–101.0)
MONO#: 0.6 10*3/uL (ref 0.1–0.9)
MONO%: 5.9 % (ref 0.0–14.0)
NEUT#: 7.9 10*3/uL — ABNORMAL HIGH (ref 1.5–6.5)
NEUT%: 79 % — ABNORMAL HIGH (ref 38.4–76.8)
Platelets: 370 10*3/uL (ref 145–400)
RDW: 15.1 % — ABNORMAL HIGH (ref 11.2–14.5)
WBC: 10.1 10*3/uL (ref 3.9–10.3)

## 2013-08-29 LAB — GLUCOSE, CAPILLARY: Glucose-Capillary: 89 mg/dL (ref 70–99)

## 2013-08-29 LAB — HIV ANTIBODY (ROUTINE TESTING W REFLEX): HIV: NONREACTIVE

## 2013-08-29 MED ORDER — FLUDEOXYGLUCOSE F - 18 (FDG) INJECTION
17.1000 | Freq: Once | INTRAVENOUS | Status: AC | PRN
  Administered 2013-08-29: 17.1 via INTRAVENOUS

## 2013-08-29 NOTE — Telephone Encounter (Signed)
Per staff message and POF I have scheduled appts.  JMW  

## 2013-08-29 NOTE — Progress Notes (Unsigned)
Erin [name] came to nursing asking for refill on percocet by Dr.moody,  Written 08/19/13,  she has enough tabs to last 6 more days,  informed her Dr.moody is off today and tomorrow, but will  E-mail him,  She will not start till 09/09/13 treatment, her medical Onc MD is Dr.Sherrill, she will ask his nurse if Med/Onc Md  will write rx for percocet,will call her Monday Morning, if Rx isn't filled will  Speak with Dr.Moody  Monday  and call her  Home phone [phone] to come pick up rx 11:42 AM

## 2013-08-30 ENCOUNTER — Telehealth: Payer: Self-pay | Admitting: *Deleted

## 2013-08-30 ENCOUNTER — Other Ambulatory Visit (HOSPITAL_COMMUNITY): Payer: No Typology Code available for payment source

## 2013-08-30 NOTE — Telephone Encounter (Signed)
Called patient home, spoke with husband, Dr.Manning wrote rx of percocet for mr.Ickes,Dr.Moody will be out of the office at another facility this Monday, she or he can pick up pain medication rx today by 5pm or wait until Monday we will be open from 8am-5pm, spouse stated he would be her Monday as she has enough pain meds to last until then, informed spouse to bring photo ID , Mr. Sorci said he would and to thank Dr.Manning so  Very much  And thanked this RN for calling 2:35 PM

## 2013-09-01 ENCOUNTER — Other Ambulatory Visit: Payer: Self-pay | Admitting: Oncology

## 2013-09-02 ENCOUNTER — Other Ambulatory Visit: Payer: No Typology Code available for payment source

## 2013-09-02 ENCOUNTER — Ambulatory Visit: Payer: No Typology Code available for payment source

## 2013-09-02 ENCOUNTER — Telehealth: Payer: Self-pay | Admitting: Oncology

## 2013-09-02 ENCOUNTER — Ambulatory Visit: Payer: No Typology Code available for payment source | Admitting: Radiation Oncology

## 2013-09-02 NOTE — Telephone Encounter (Signed)
Pt husband came in today for schedule. Added pump d/c for 10/24 and gv husband appt schedule for October.

## 2013-09-03 ENCOUNTER — Ambulatory Visit: Payer: No Typology Code available for payment source

## 2013-09-03 NOTE — Progress Notes (Signed)
  Radiation Oncology         [phone] ________________________________  Name: Erin [name] MRN: [id number]  Date: 08/23/2013  DOB: November 07, 1956  SIMULATION AND TREATMENT PLANNING NOTE   CONSENT VERIFIED: yes   SET UP: Patient is set-up supine   IMMOBILIZATION: The following immobilization is used: alpha-cradle. This complex treatment device will be used on a daily basis during the patient's treatment.   Diagnosis: Anal cancer   NARRATIVE: The patient was brought to the CT Simulation planning suite. Identity was confirmed. All relevant records and images related to the planned course of therapy were reviewed. Then, the patient was positioned in a stable reproducible clinical set-up for radiation therapy using an aquaplast mask. Skin markings were placed. The CT images were loaded into the planning software where the target and avoidance structures were contoured.The radiation prescription was entered and confirmed.   The patient will receive 50.4 Gy in 28 fractions to the high-dose target region.  Daily image guidance is ordered, and this will be used on a daily basis. This is necessary to ensure accurate and precise localization of the target in addition to accurate alignment of the normal tissue structures in this region. This is particularly important given the possible motion of the high-dose target.  Treatment planning then occurred.   I have requested : Intensity Modulated Radiotherapy (IMRT) is medically necessary for this case for the following reason: Dose homogeneity; the target is in close proximity to critical normal structures, including the femoral heads, bladder, and small bowel. IMRT is thus medically to appropriately treat the patient.   Special treatment procedure  The patient will receive chemotherapy during the course of radiation treatment. The patient may experience increased or overlapping toxicity due to this combined-modality approach and the patient will be  monitored for such problems. This may include extra lab  work as necessary. This therefore constitutes a special treatment procedure.     ________________________________  Radene Gunning, MD, PhD

## 2013-09-04 ENCOUNTER — Ambulatory Visit: Payer: No Typology Code available for payment source

## 2013-09-05 ENCOUNTER — Ambulatory Visit: Payer: No Typology Code available for payment source

## 2013-09-06 ENCOUNTER — Other Ambulatory Visit: Payer: Self-pay | Admitting: Family Medicine

## 2013-09-06 ENCOUNTER — Ambulatory Visit (HOSPITAL_COMMUNITY): Admission: RE | Admit: 2013-09-06 | Payer: No Typology Code available for payment source | Source: Ambulatory Visit

## 2013-09-06 ENCOUNTER — Ambulatory Visit (HOSPITAL_COMMUNITY)
Admission: RE | Admit: 2013-09-06 | Discharge: 2013-09-06 | Disposition: A | Discharge status: Home / Self Care | Payer: No Typology Code available for payment source | Source: Ambulatory Visit | Attending: Oncology | Admitting: Oncology

## 2013-09-06 ENCOUNTER — Other Ambulatory Visit: Payer: Self-pay | Admitting: Oncology

## 2013-09-06 ENCOUNTER — Ambulatory Visit: Payer: No Typology Code available for payment source

## 2013-09-06 DIAGNOSIS — C20 Malignant neoplasm of rectum: Secondary | ICD-10-CM | POA: Insufficient documentation

## 2013-09-06 DIAGNOSIS — C210 Malignant neoplasm of anus, unspecified: Secondary | ICD-10-CM

## 2013-09-06 NOTE — Procedures (Signed)
Successful placement of right basilic vein approach 40 cm dual lumen PICC line with tip at the superior caval-atrial junction.  The PICC line is ready for immediate use. 

## 2013-09-08 ENCOUNTER — Other Ambulatory Visit: Payer: Self-pay | Admitting: Oncology

## 2013-09-09 ENCOUNTER — Ambulatory Visit: Payer: PRIVATE HEALTH INSURANCE | Admitting: Nutrition

## 2013-09-09 ENCOUNTER — Ambulatory Visit (HOSPITAL_BASED_OUTPATIENT_CLINIC_OR_DEPARTMENT_OTHER): Payer: No Typology Code available for payment source

## 2013-09-09 ENCOUNTER — Ambulatory Visit
Admission: RE | Admit: 2013-09-09 | Discharge: 2013-09-09 | Disposition: A | Discharge status: Home / Self Care | Payer: No Typology Code available for payment source | Source: Ambulatory Visit | Attending: Radiation Oncology | Admitting: Radiation Oncology

## 2013-09-09 ENCOUNTER — Ambulatory Visit: Payer: No Typology Code available for payment source

## 2013-09-09 ENCOUNTER — Telehealth: Payer: Self-pay | Admitting: *Deleted

## 2013-09-09 ENCOUNTER — Other Ambulatory Visit: Payer: Self-pay | Admitting: *Deleted

## 2013-09-09 VITALS — BP 109/58 | HR 66 | Temp 97.7°F | Resp 18

## 2013-09-09 DIAGNOSIS — C210 Malignant neoplasm of anus, unspecified: Secondary | ICD-10-CM

## 2013-09-09 DIAGNOSIS — Z5111 Encounter for antineoplastic chemotherapy: Secondary | ICD-10-CM

## 2013-09-09 MED ORDER — SODIUM CHLORIDE 0.9 % IV SOLN
Freq: Once | INTRAVENOUS | Status: AC
  Administered 2013-09-09: 10:00:00 via INTRAVENOUS

## 2013-09-09 MED ORDER — ONDANSETRON 8 MG/NS 50 ML IVPB
INTRAVENOUS | Status: AC
  Filled 2013-09-09: qty 8

## 2013-09-09 MED ORDER — ONDANSETRON 8 MG/50ML IVPB (CHCC)
8.0000 mg | Freq: Once | INTRAVENOUS | Status: AC
  Administered 2013-09-09: 8 mg via INTRAVENOUS

## 2013-09-09 MED ORDER — SODIUM CHLORIDE 0.9 % IV SOLN
1000.0000 mg/m2/d | INTRAVENOUS | Status: DC
  Administered 2013-09-09: 6150 mg via INTRAVENOUS
  Filled 2013-09-09: qty 123

## 2013-09-09 MED ORDER — MITOMYCIN CHEMO IV INJECTION 20 MG
10.0000 mg/m2 | Freq: Once | INTRAVENOUS | Status: AC
  Administered 2013-09-09: 15.5 mg via INTRAVENOUS
  Filled 2013-09-09: qty 31

## 2013-09-09 MED ORDER — DEXAMETHASONE SODIUM PHOSPHATE 10 MG/ML IJ SOLN
10.0000 mg | Freq: Once | INTRAMUSCULAR | Status: AC
  Administered 2013-09-09: 10 mg via INTRAVENOUS

## 2013-09-09 MED ORDER — DEXAMETHASONE SODIUM PHOSPHATE 10 MG/ML IJ SOLN
INTRAMUSCULAR | Status: AC
  Filled 2013-09-09: qty 1

## 2013-09-09 MED ORDER — PROCHLORPERAZINE MALEATE 10 MG PO TABS: 10.0000 mg | ORAL_TABLET | Freq: Four times a day (QID) | ORAL | Status: DC | PRN

## 2013-09-09 NOTE — Progress Notes (Addendum)
Patient eduication done, radiation therapy and you book, sitz bath given, my business card, discusses side effects, sign/symptoms to report, nausea, vomiting, diarrhea, fatigue, skin irritation, pain, bladder changes,, nutrition, teacjh back given,ses MD weekly/prn,can call for any questions,concerns

## 2013-09-09 NOTE — Patient Instructions (Signed)
[facility] Discharge Instructions for Patients Receiving Chemotherapy  Today you received the following chemotherapy agents Mitomycin,74fu To help prevent nausea and vomiting after your treatment, we encourage you to take your nausea medication as directed.   If you develop nausea and vomiting that is not controlled by your nausea medication, call the clinic.   BELOW ARE SYMPTOMS THAT SHOULD BE REPORTED IMMEDIATELY:  *FEVER GREATER THAN 100.5 F  *CHILLS WITH OR WITHOUT FEVER  NAUSEA AND VOMITING THAT IS NOT CONTROLLED WITH YOUR NAUSEA MEDICATION  *UNUSUAL SHORTNESS OF BREATH  *UNUSUAL BRUISING OR BLEEDING  TENDERNESS IN MOUTH AND THROAT WITH OR WITHOUT PRESENCE OF ULCERS  *URINARY PROBLEMS  *BOWEL PROBLEMS  UNUSUAL RASH Items with * indicate a potential emergency and should be followed up as soon as possible.  Feel free to call the clinic you have any questions or concerns. The clinic phone number is [phone].

## 2013-09-09 NOTE — Progress Notes (Signed)
Followup completed in the chemotherapy room.  Patient reports she was weighed today.  Weight was approximately 112 pounds, which is down about 6 pounds since October 2.  Patient denies side effects.  Today is her first treatment.  Patient has purchase and enjoys drinking Carn[employer] breakfast essentials.  She continues to eat very small amounts.  Nutrition diagnosis: Food and nutrition related knowledge deficit continues.  Intervention: I educated patient on the importance of increased calories and protein throughout the day to minimize further weight loss.  I have reviewed recipes for her to try.  I've made suggestions for ways to add calories to the food she currently is eating.  I've encouraged her to try to eat every 1-2 hours.  I provided fact sheets for her to take with her today.  We've discussed the importance of her taking nausea medications as prescribed.  Questions were answered.  Teach back method used.  Monitoring, evaluation, goals: Patient will tolerate increased calories and protein to minimize further weight loss.  Next visit: Has not been scheduled at this time.  Will followup with chemotherapy visit.  Patient has my contact information if needed before then.

## 2013-09-09 NOTE — Telephone Encounter (Signed)
done

## 2013-09-09 NOTE — Progress Notes (Signed)
Within 1 minute of giving decadron slowly pt reported itching all over scalp to rectum . Medication stopped. IV line aspirated of 10 ml of blood and new saline line started. Itching immediately resolved.VSS. Dr Truett Perna notified.

## 2013-09-10 ENCOUNTER — Ambulatory Visit
Admission: RE | Admit: 2013-09-10 | Discharge: 2013-09-10 | Disposition: A | Discharge status: Home / Self Care | Payer: No Typology Code available for payment source | Source: Ambulatory Visit | Attending: Radiation Oncology | Admitting: Radiation Oncology

## 2013-09-10 ENCOUNTER — Ambulatory Visit: Payer: No Typology Code available for payment source

## 2013-09-11 ENCOUNTER — Ambulatory Visit
Admission: RE | Admit: 2013-09-11 | Discharge: 2013-09-11 | Disposition: A | Discharge status: Home / Self Care | Payer: No Typology Code available for payment source | Source: Ambulatory Visit | Attending: Radiation Oncology | Admitting: Radiation Oncology

## 2013-09-11 ENCOUNTER — Ambulatory Visit: Payer: No Typology Code available for payment source

## 2013-09-12 ENCOUNTER — Other Ambulatory Visit: Payer: No Typology Code available for payment source | Admitting: Lab

## 2013-09-12 ENCOUNTER — Other Ambulatory Visit: Payer: Self-pay | Admitting: Radiation Oncology

## 2013-09-12 ENCOUNTER — Ambulatory Visit
Admission: RE | Admit: 2013-09-12 | Discharge: 2013-09-12 | Disposition: A | Discharge status: Home / Self Care | Payer: No Typology Code available for payment source | Source: Ambulatory Visit | Attending: Radiation Oncology | Admitting: Radiation Oncology

## 2013-09-12 ENCOUNTER — Ambulatory Visit: Payer: No Typology Code available for payment source | Admitting: Nurse Practitioner

## 2013-09-12 ENCOUNTER — Telehealth: Payer: Self-pay | Admitting: *Deleted

## 2013-09-12 ENCOUNTER — Ambulatory Visit: Payer: No Typology Code available for payment source

## 2013-09-12 ENCOUNTER — Encounter: Payer: Self-pay | Admitting: Radiation Oncology

## 2013-09-12 VITALS — BP 86/64 | HR 100 | Temp 98.4°F | Resp 16 | Wt 112.3 lb

## 2013-09-12 DIAGNOSIS — C210 Malignant neoplasm of anus, unspecified: Secondary | ICD-10-CM

## 2013-09-12 MED ORDER — SODIUM CHLORIDE 0.9 % IV SOLN
INTRAVENOUS | Status: AC
  Administered 2013-09-12: 09:00:00 via INTRAVENOUS
  Filled 2013-09-12: qty 1000

## 2013-09-12 MED ORDER — SODIUM CHLORIDE 0.9 % IV SOLN: Freq: Once | INTRAVENOUS | Status: DC

## 2013-09-12 MED ORDER — ONDANSETRON HCL 8 MG PO TABS: 8.0000 mg | ORAL_TABLET | Freq: Two times a day (BID) | ORAL | Status: DC | PRN

## 2013-09-12 MED ORDER — SODIUM CHLORIDE 0.9 % IJ SOLN
10.0000 mL | Freq: Once | INTRAMUSCULAR | Status: AC
  Administered 2013-09-12: 10 mL via INTRAVENOUS

## 2013-09-12 MED ORDER — ONDANSETRON HCL 4 MG PO TABS
8.0000 mg | ORAL_TABLET | Freq: Once | ORAL | Status: AC
  Administered 2013-09-12: 8 mg via ORAL
  Filled 2013-09-12: qty 2

## 2013-09-12 NOTE — Progress Notes (Signed)
Per written and verbal orders, read back, per Dr.Moody, 8mg  oral zofran x 1 given to pateint with sips gingerale, moist cloth helps" stated patient, 2 warm blankets oferred, started IV NS 0.9 1 liter to be given right picc line, patient also has 61fu ongoing, site intact, no redness or swelling, dimmed light s for patient room 7, call if needed,will continuw to monitor patient, kleenex and emesis basin within reach of patient 9:13 AM

## 2013-09-12 NOTE — Progress Notes (Signed)
Weekly rad tx  3/28 completd apelvic, nauseated compazine not helping, gave cool moist cloth to place around pataients neck, emesis basin, had chees toast yesterday with bacon, nothing else yesterday , offerred gingerale , did drink diet mtn, yesterdays well, no bowel movement  She thins last Sunday, has stool softners, unable to take at present time, too nauseated, threw up mylanta yesterday, felt better after throwing up that with bile, weak slow steady gait 8:40 AM  8:40 AM  dew

## 2013-09-12 NOTE — Progress Notes (Signed)
Department of Radiation Oncology  Phone:  [phone] Fax:        [phone]  Weekly Treatment Note    Name: Erin [name] Date: 09/12/2013 MRN: [id number] DOB: 11-05-1956   Current dose: 7.2 Gy  Current fraction: 4   MEDICATIONS: Current Outpatient Prescriptions  Medication Sig Dispense Refill  . Aspirin-Salicylamide-Caffeine (BC HEADACHE POWDER PO) Take by mouth as needed.      . bisacodyl (DULCOLAX) 5 MG EC tablet Take 15 mg by mouth daily as needed for constipation.      . cimetidine (TAGAMET) 200 MG tablet Take 200 mg by mouth as needed.      . docusate sodium (COLACE) 100 MG capsule Take 100 mg by mouth 2 (two) times daily.      . ferrous sulfate 325 (65 FE) MG tablet Take 325 mg by mouth 2 (two) times daily.      . fluticasone (FLONASE) 50 MCG/ACT nasal spray Place 2 sprays into the nose daily.  16 g  6  . gabapentin (NEURONTIN) 600 MG tablet TAKE 1 TABLET THREE TIMES A DAY  180 tablet  1  . OVER THE COUNTER MEDICATION Apply 1 application topically 3 (three) times daily as needed ([facility]).       Marland Kitchen oxyCODONE-acetaminophen (PERCOCET/ROXICET) 5-325 MG per tablet Take 1 tablet by mouth every 4 (four) hours as needed for pain. May take 2 tablets if needed every 4-6 hours.  60 tablet  0  . Probiotic Product (PROBIOTIC DAILY PO) Take by mouth daily.       . prochlorperazine (COMPAZINE) 10 MG tablet Take 1 tablet (10 mg total) by mouth every 6 (six) hours as needed (nausea).  60 tablet  1  . varenicline (CHANTIX CONTINUING MONTH PAK) 1 MG tablet Take 1 tablet (1 mg total) by mouth 2 (two) times daily.  60 tablet  1  . varenicline (CHANTIX STARTING MONTH PAK) 0.5 MG X 11 & 1 MG X 42 tablet Take one 0.5 mg tablet by mouth once daily for 3 days, then increase to one 0.5 mg tablet twice daily for 4 days, then increase to one 1 mg tablet twice daily.  53 tablet  0  . vitamin B-12 (CYANOCOBALAMIN) 1000 MCG tablet Place 1,000 mcg under the tongue daily.      . vitamin E 1000  UNIT capsule Take 1,000 Units by mouth daily.      . ondansetron (ZOFRAN) 8 MG tablet Take 1 tablet (8 mg total) by mouth every 12 (twelve) hours as needed for nausea.  40 tablet  0   Current Facility-Administered Medications  Medication Dose Route Frequency Provider Last Rate Last Dose  . 0.9 %  sodium chloride infusion   Intravenous Continuous Jonna Coup, MD      . ondansetron [facility]) tablet 8 mg  8 mg Oral Once Jonna Coup, MD         ALLERGIES: Dexamethasone; Claritin; Sulfa antibiotics; and Prednisone   LABORATORY DATA:  Lab Results  Component Value Date   WBC 10.1 08/29/2013   HGB 12.6 08/29/2013   HCT 37.8 08/29/2013   MCV 92.1 08/29/2013   PLT 370 08/29/2013   Lab Results  Component Value Date   NA 143 08/29/2013   K 4.2 08/29/2013   CL 111 07/30/2013   CO2 27 08/29/2013   Lab Results  Component Value Date   ALT <6 08/29/2013   AST 13 08/29/2013   ALKPHOS 75 08/29/2013   BILITOT 0.22 08/29/2013  NARRATIVE: Erin [name] was seen today for weekly treatment management. The chart was checked and the patient's films were reviewed. The patient is nausea today. She is not feeling well and has not been able to keep very much down. She also is constipated.  PHYSICAL EXAMINATION: weight is 112 lb 4.8 oz (50.939 kg). Her oral temperature is 98.4 F (36.9 C). Her blood pressure is 94/64 and her pulse is 100. Her respiration is 16.      decreased blood pressure today consistent with her history of ongoing nausea. We were not able to get orthostatics on her because she was weak today.  ASSESSMENT: The patient is having some nausea and is dehydrated today. She needs IV fluids and we will give her a prescription for Zofran to take in addition to Compazine medication which she currently is taking. She does know to resume her stool softeners and at MiraLax medication.  PLAN: IV fluids today and Zofran called into the pharmacy.

## 2013-09-12 NOTE — Progress Notes (Signed)
Checked on patient after zofran given and IVF's NS started, she looks better and states"I'm much better, nausea has subsided","thank you for making me feeel better', offered saltines and applesauce, gingerale 9:40 AM  Rechecked ortho vitals, reported to [facility], 1 liter Ns completed

## 2013-09-12 NOTE — Telephone Encounter (Signed)
Called patient ,spoke with Erin [name], she is scheduled  To get IVF's tomorrow at 8am and Saturday at 8am , in medical oncology dept, come 15 minutes prior and once IVf''s started, she can come for radiation txs, patient stated she feels better and even ate chicken noodle/veg soup and kept it down, called [facility] and informe dthem of pt statsu 3:53 PM  3:53 PM

## 2013-09-13 ENCOUNTER — Ambulatory Visit: Payer: No Typology Code available for payment source

## 2013-09-13 ENCOUNTER — Other Ambulatory Visit: Payer: Self-pay | Admitting: Radiation Oncology

## 2013-09-13 ENCOUNTER — Other Ambulatory Visit: Payer: Self-pay | Admitting: *Deleted

## 2013-09-13 ENCOUNTER — Ambulatory Visit (HOSPITAL_BASED_OUTPATIENT_CLINIC_OR_DEPARTMENT_OTHER): Payer: No Typology Code available for payment source

## 2013-09-13 ENCOUNTER — Ambulatory Visit: Payer: PRIVATE HEALTH INSURANCE | Admitting: Nutrition

## 2013-09-13 ENCOUNTER — Ambulatory Visit
Admission: RE | Admit: 2013-09-13 | Discharge: 2013-09-13 | Disposition: A | Discharge status: Home / Self Care | Payer: No Typology Code available for payment source | Source: Ambulatory Visit | Attending: Radiation Oncology | Admitting: Radiation Oncology

## 2013-09-13 VITALS — BP 125/66 | HR 68 | Temp 97.9°F | Resp 18

## 2013-09-13 DIAGNOSIS — E860 Dehydration: Secondary | ICD-10-CM

## 2013-09-13 DIAGNOSIS — C210 Malignant neoplasm of anus, unspecified: Secondary | ICD-10-CM

## 2013-09-13 MED ORDER — HEPARIN SOD (PORK) LOCK FLUSH 100 UNIT/ML IV SOLN
500.0000 [IU] | Freq: Once | INTRAVENOUS | Status: AC
  Administered 2013-09-13: 500 [IU] via INTRAVENOUS
  Filled 2013-09-13: qty 5

## 2013-09-13 MED ORDER — SODIUM CHLORIDE 0.9 % IJ SOLN
10.0000 mL | INTRAMUSCULAR | Status: DC | PRN
  Administered 2013-09-13: 10 mL via INTRAVENOUS
  Filled 2013-09-13: qty 10

## 2013-09-13 MED ORDER — SODIUM CHLORIDE 0.9 % IJ SOLN
10.0000 mL | INTRAMUSCULAR | Status: DC | PRN
  Administered 2013-09-13 (×2): 10 mL via INTRAVENOUS
  Filled 2013-09-13: qty 10

## 2013-09-13 MED ORDER — SODIUM CHLORIDE 0.9 % IV SOLN
INTRAVENOUS | Status: DC
  Administered 2013-09-13: 08:00:00 via INTRAVENOUS

## 2013-09-13 NOTE — Telephone Encounter (Signed)
error 

## 2013-09-13 NOTE — Progress Notes (Signed)
Received a request from GI navigator to visit this patient in chemotherapy.  Patient has experienced severe nausea and vomiting, and dehydration.  Patient states she was not taking nausea medications.  States dehydration "hit her fast." I educated patient on strategies for eating with nausea and vomiting.  I provided samples of ensure clear for patient to try.  She was encouraged to take nausea medications as prescribed to reduce chance of nausea.  Patient verbalized understanding and willingness to comply.  Patient agrees to contact me with questions or concerns.

## 2013-09-13 NOTE — Progress Notes (Signed)
Done in infusion 

## 2013-09-13 NOTE — Patient Instructions (Signed)
Dehydration, Adult Dehydration is when you lose more fluids from the body than you take in. Vital organs like the kidneys, brain, and heart cannot function without a proper amount of fluids and salt. Any loss of fluids from the body can cause dehydration.  CAUSES   Vomiting.  Diarrhea.  Excessive sweating.  Excessive urine output.  Fever. SYMPTOMS  Mild dehydration  Thirst.  Dry lips.  Slightly dry mouth. Moderate dehydration  Very dry mouth.  Sunken eyes.  Skin does not bounce back quickly when lightly pinched and released.  Dark urine and decreased urine production.  Decreased tear production.  Headache. Severe dehydration  Very dry mouth.  Extreme thirst.  Rapid, weak pulse (more than 100 beats per minute at rest).  Cold hands and feet.  Not able to sweat in spite of heat and temperature.  Rapid breathing.  Blue lips.  Confusion and lethargy.  Difficulty being awakened.  Minimal urine production.  No tears. DIAGNOSIS  Your caregiver will diagnose dehydration based on your symptoms and your exam. Blood and urine tests will help confirm the diagnosis. The diagnostic evaluation should also identify the cause of dehydration. TREATMENT  Treatment of mild or moderate dehydration can often be done at home by increasing the amount of fluids that you drink. It is best to drink small amounts of fluid more often. Drinking too much at one time can make vomiting worse. Refer to the home care instructions below. Severe dehydration needs to be treated at the hospital where you will probably be given intravenous (IV) fluids that contain water and electrolytes. HOME CARE INSTRUCTIONS   Ask your caregiver about specific rehydration instructions.  Drink enough fluids to keep your urine clear or pale yellow.  Drink small amounts frequently if you have nausea and vomiting.  Eat as you normally do.  Avoid:  Foods or drinks high in sugar.  Carbonated  drinks.  Juice.  Extremely hot or cold fluids.  Drinks with caffeine.  Fatty, greasy foods.  Alcohol.  Tobacco.  Overeating.  Gelatin desserts.  Wash your hands well to avoid spreading bacteria and viruses.  Only take over-the-counter or prescription medicines for pain, discomfort, or fever as directed by your caregiver.  Ask your caregiver if you should continue all prescribed and over-the-counter medicines.  Keep all follow-up appointments with your caregiver. SEEK MEDICAL CARE IF:  You have abdominal pain and it increases or stays in one area (localizes).  You have a rash, stiff neck, or severe headache.  You are irritable, sleepy, or difficult to awaken.  You are weak, dizzy, or extremely thirsty. SEEK IMMEDIATE MEDICAL CARE IF:   You are unable to keep fluids down or you get worse despite treatment.  You have frequent episodes of vomiting or diarrhea.  You have blood or green matter (bile) in your vomit.  You have blood in your stool or your stool looks black and tarry.  You have not urinated in 6 to 8 hours, or you have only urinated a small amount of very dark urine.  You have a fever.  You faint. MAKE SURE YOU:   Understand these instructions.  Will watch your condition.  Will get help right away if you are not doing well or get worse. Document Released: 11/07/2005 Document Revised: 01/30/2012 Document Reviewed: 06/27/2011 [employer] Patient Information 2014 [employer].  

## 2013-09-14 ENCOUNTER — Ambulatory Visit (HOSPITAL_BASED_OUTPATIENT_CLINIC_OR_DEPARTMENT_OTHER): Payer: No Typology Code available for payment source

## 2013-09-14 ENCOUNTER — Other Ambulatory Visit: Payer: Self-pay | Admitting: *Deleted

## 2013-09-14 VITALS — BP 123/68 | HR 70 | Temp 97.5°F | Resp 20

## 2013-09-14 DIAGNOSIS — C210 Malignant neoplasm of anus, unspecified: Secondary | ICD-10-CM

## 2013-09-14 DIAGNOSIS — E860 Dehydration: Secondary | ICD-10-CM

## 2013-09-14 MED ORDER — HEPARIN SOD (PORK) LOCK FLUSH 100 UNIT/ML IV SOLN
250.0000 [IU] | Freq: Once | INTRAVENOUS | Status: AC
  Administered 2013-09-14: 250 [IU] via INTRAVENOUS
  Filled 2013-09-14: qty 5

## 2013-09-14 MED ORDER — DIPHENHYDRAMINE HCL 50 MG/ML IJ SOLN
INTRAMUSCULAR | Status: AC
  Filled 2013-09-14: qty 1

## 2013-09-14 MED ORDER — SODIUM CHLORIDE 0.9 % IJ SOLN
10.0000 mL | Freq: Once | INTRAMUSCULAR | Status: AC
  Administered 2013-09-14: 10 mL
  Filled 2013-09-14: qty 10

## 2013-09-14 MED ORDER — NYSTATIN 100000 UNIT/ML MT SUSP: 500000.0000 [IU] | Freq: Four times a day (QID) | OROMUCOSAL | Status: DC

## 2013-09-14 MED ORDER — SODIUM CHLORIDE 0.9 % IV SOLN
Freq: Once | INTRAVENOUS | Status: AC
  Administered 2013-09-14: 08:00:00 via INTRAVENOUS

## 2013-09-14 MED ORDER — DIPHENHYDRAMINE HCL 50 MG/ML IJ SOLN
25.0000 mg | Freq: Once | INTRAMUSCULAR | Status: AC
  Administered 2013-09-14: 25 mg via INTRAVENOUS

## 2013-09-14 NOTE — Progress Notes (Signed)
Patient states her gums were bleeding this am.  She has a metallic taste in her mouth and her tongue has a white covering.  Her tongue had a lttle bit of white yesterday, but much more today.  Paged Dr. Rosie Fate.  Will e-scribe nystatin mouthwash . 0845 Pt. Had episode were she became very dizzy and groggy.  She was responsive, but very slow to respond.  Pt. Placed in prone position.  O2 via nasal cannula administered.@ 8/L  VS taken. B/P elevated and pulse decreased from baseline.  Dr. Rosie Fate paged.  Patient started feeling better.  She describes a sudden dizziness and that "I was going around in the room."  Pt. Had taken her zofran, compazine and pain medication this morning, but this is not different than what she has done before.  Pulse 0x sat at 100%.  O2 decreased to 2L/min. Patient's sx return, but with less severity when she sits up.  Discussed patient with Dr. Rosie Fate.  Reduce rate of IVF and take orthostatics at finish of IVF.  Fluids reduced from rate of 500cc/hr to 300cc/hr. 9:30.  Patient is feeling much better.  Requests that O2 be removed.  Patient able to sit up without any return of dizziness. 10:16 Patient up to bathroom and returns without any sx.  States she is feeling fine. 1100 Husband returned from pharmacy with nystatin and patient took her first dose. 1130.  Patient notes red rash on her neck and states it itches at times. Rash was apparent when she came this morning, she is just more aware of it at present.    Normajean Baxter RN discussed with Dr. Rosie Fate and VO given for benadryl IV. 1220.  Rash appears to be a little bit better.  Orthostatic B/P done.  Patient states she feels fine and is ready to go home.  Orthostatic B/P reported to Dr. Rosie Fate.  Patient discharged in wheelchair with husband.  Reminded to maintain a good po intake.  She will return on Monday for Radiation Therapy.  Will evaluate need for fluids on Monday.  If she does not need fluids then PICC line will need flush.  POF done.   Patient and husband know to call if any more problems this weekend.

## 2013-09-14 NOTE — Patient Instructions (Signed)
Dehydration, Adult Dehydration is when you lose more fluids from the body than you take in. Vital organs like the kidneys, brain, and heart cannot function without a proper amount of fluids and salt. Any loss of fluids from the body can cause dehydration.  CAUSES   Vomiting.  Diarrhea.  Excessive sweating.  Excessive urine output.  Fever. SYMPTOMS  Mild dehydration  Thirst.  Dry lips.  Slightly dry mouth. Moderate dehydration  Very dry mouth.  Sunken eyes.  Skin does not bounce back quickly when lightly pinched and released.  Dark urine and decreased urine production.  Decreased tear production.  Headache. Severe dehydration  Very dry mouth.  Extreme thirst.  Rapid, weak pulse (more than 100 beats per minute at rest).  Cold hands and feet.  Not able to sweat in spite of heat and temperature.  Rapid breathing.  Blue lips.  Confusion and lethargy.  Difficulty being awakened.  Minimal urine production.  No tears. DIAGNOSIS  Your caregiver will diagnose dehydration based on your symptoms and your exam. Blood and urine tests will help confirm the diagnosis. The diagnostic evaluation should also identify the cause of dehydration. TREATMENT  Treatment of mild or moderate dehydration can often be done at home by increasing the amount of fluids that you drink. It is best to drink small amounts of fluid more often. Drinking too much at one time can make vomiting worse. Refer to the home care instructions below. Severe dehydration needs to be treated at the hospital where you will probably be given intravenous (IV) fluids that contain water and electrolytes. HOME CARE INSTRUCTIONS   Ask your caregiver about specific rehydration instructions.  Drink enough fluids to keep your urine clear or pale yellow.  Drink small amounts frequently if you have nausea and vomiting.  Eat as you normally do.  Avoid:  Foods or drinks high in sugar.  Carbonated  drinks.  Juice.  Extremely hot or cold fluids.  Drinks with caffeine.  Fatty, greasy foods.  Alcohol.  Tobacco.  Overeating.  Gelatin desserts.  Wash your hands well to avoid spreading bacteria and viruses.  Only take over-the-counter or prescription medicines for pain, discomfort, or fever as directed by your caregiver.  Ask your caregiver if you should continue all prescribed and over-the-counter medicines.  Keep all follow-up appointments with your caregiver. SEEK MEDICAL CARE IF:  You have abdominal pain and it increases or stays in one area (localizes).  You have a rash, stiff neck, or severe headache.  You are irritable, sleepy, or difficult to awaken.  You are weak, dizzy, or extremely thirsty. SEEK IMMEDIATE MEDICAL CARE IF:   You are unable to keep fluids down or you get worse despite treatment.  You have frequent episodes of vomiting or diarrhea.  You have blood or green matter (bile) in your vomit.  You have blood in your stool or your stool looks black and tarry.  You have not urinated in 6 to 8 hours, or you have only urinated a small amount of very dark urine.  You have a fever.  You faint. MAKE SURE YOU:   Understand these instructions.  Will watch your condition.  Will get help right away if you are not doing well or get worse. Document Released: 11/07/2005 Document Revised: 01/30/2012 Document Reviewed: 06/27/2011 [facility] Patient Information 2014 [place], [place].   Take Nystatin  5 ml  Four times daily as needed for mouth thrush.  Swish and Swallow.  DO NOT Eat or drink  For  at least  30 minutes.   Take Benadryl 25 mg by mouth every 8 hours as needed for itching, redness around neck.  If symptoms worsened,  Call md on call for further instructions.

## 2013-09-16 ENCOUNTER — Ambulatory Visit (HOSPITAL_BASED_OUTPATIENT_CLINIC_OR_DEPARTMENT_OTHER): Payer: No Typology Code available for payment source

## 2013-09-16 ENCOUNTER — Other Ambulatory Visit: Payer: Self-pay | Admitting: *Deleted

## 2013-09-16 ENCOUNTER — Ambulatory Visit
Admission: RE | Admit: 2013-09-16 | Discharge: 2013-09-16 | Disposition: A | Discharge status: Home / Self Care | Payer: No Typology Code available for payment source | Source: Ambulatory Visit | Attending: Radiation Oncology | Admitting: Radiation Oncology

## 2013-09-16 ENCOUNTER — Telehealth: Payer: Self-pay | Admitting: Oncology

## 2013-09-16 VITALS — BP 127/81 | HR 71 | Temp 97.7°F | Resp 18

## 2013-09-16 DIAGNOSIS — Z452 Encounter for adjustment and management of vascular access device: Secondary | ICD-10-CM

## 2013-09-16 DIAGNOSIS — C210 Malignant neoplasm of anus, unspecified: Secondary | ICD-10-CM

## 2013-09-16 MED ORDER — SODIUM CHLORIDE 0.9 % IJ SOLN
10.0000 mL | INTRAMUSCULAR | Status: DC | PRN
  Administered 2013-09-16: 10 mL via INTRAVENOUS
  Filled 2013-09-16: qty 10

## 2013-09-16 MED ORDER — HEPARIN SOD (PORK) LOCK FLUSH 100 UNIT/ML IV SOLN
500.0000 [IU] | Freq: Once | INTRAVENOUS | Status: AC
  Administered 2013-09-16: 250 [IU] via INTRAVENOUS
  Filled 2013-09-16: qty 5

## 2013-09-16 NOTE — Telephone Encounter (Signed)
S/w the pt's husband and he is aware to pick up the new appt calendars when they come in on  Wed for the picc flush appts.

## 2013-09-16 NOTE — Progress Notes (Signed)
Patient with rash to neck and right side of face. Patient c/o rash itching and also stated that her gums were bleeding and she has some mouth sores. Patient seen by Dr. Truett Perna.

## 2013-09-17 ENCOUNTER — Ambulatory Visit
Admission: RE | Admit: 2013-09-17 | Discharge: 2013-09-17 | Disposition: A | Discharge status: Home / Self Care | Payer: No Typology Code available for payment source | Source: Ambulatory Visit | Attending: Radiation Oncology | Admitting: Radiation Oncology

## 2013-09-18 ENCOUNTER — Other Ambulatory Visit: Payer: Self-pay | Admitting: *Deleted

## 2013-09-18 ENCOUNTER — Encounter: Payer: Self-pay | Admitting: *Deleted

## 2013-09-18 ENCOUNTER — Telehealth: Payer: Self-pay | Admitting: *Deleted

## 2013-09-18 ENCOUNTER — Ambulatory Visit
Admission: RE | Admit: 2013-09-18 | Discharge: 2013-09-18 | Disposition: A | Discharge status: Home / Self Care | Payer: No Typology Code available for payment source | Source: Ambulatory Visit | Attending: Radiation Oncology | Admitting: Radiation Oncology

## 2013-09-18 ENCOUNTER — Ambulatory Visit (HOSPITAL_BASED_OUTPATIENT_CLINIC_OR_DEPARTMENT_OTHER): Payer: No Typology Code available for payment source

## 2013-09-18 ENCOUNTER — Other Ambulatory Visit (HOSPITAL_BASED_OUTPATIENT_CLINIC_OR_DEPARTMENT_OTHER): Payer: No Typology Code available for payment source | Admitting: Lab

## 2013-09-18 ENCOUNTER — Ambulatory Visit
Admission: RE | Admit: 2013-09-18 | Payer: No Typology Code available for payment source | Source: Ambulatory Visit | Admitting: Radiation Oncology

## 2013-09-18 ENCOUNTER — Ambulatory Visit (HOSPITAL_BASED_OUTPATIENT_CLINIC_OR_DEPARTMENT_OTHER): Payer: No Typology Code available for payment source | Admitting: Nurse Practitioner

## 2013-09-18 VITALS — BP 128/96 | HR 80 | Temp 97.4°F | Resp 19 | Ht 63.0 in | Wt 108.9 lb

## 2013-09-18 DIAGNOSIS — R21 Rash and other nonspecific skin eruption: Secondary | ICD-10-CM

## 2013-09-18 DIAGNOSIS — C210 Malignant neoplasm of anus, unspecified: Secondary | ICD-10-CM

## 2013-09-18 DIAGNOSIS — E119 Type 2 diabetes mellitus without complications: Secondary | ICD-10-CM

## 2013-09-18 DIAGNOSIS — G893 Neoplasm related pain (acute) (chronic): Secondary | ICD-10-CM

## 2013-09-18 DIAGNOSIS — E785 Hyperlipidemia, unspecified: Secondary | ICD-10-CM

## 2013-09-18 DIAGNOSIS — K121 Other forms of stomatitis: Secondary | ICD-10-CM

## 2013-09-18 DIAGNOSIS — Z452 Encounter for adjustment and management of vascular access device: Secondary | ICD-10-CM

## 2013-09-18 DIAGNOSIS — D509 Iron deficiency anemia, unspecified: Secondary | ICD-10-CM

## 2013-09-18 DIAGNOSIS — K5900 Constipation, unspecified: Secondary | ICD-10-CM

## 2013-09-18 DIAGNOSIS — E538 Deficiency of other specified B group vitamins: Secondary | ICD-10-CM

## 2013-09-18 LAB — CBC WITH DIFFERENTIAL/PLATELET
BASO%: 0.3 % (ref 0.0–2.0)
Basophils Absolute: 0 10*3/uL (ref 0.0–0.1)
HCT: 35.4 % (ref 34.8–46.6)
LYMPH%: 19 % (ref 14.0–49.7)
MCH: 30 pg (ref 25.1–34.0)
MCHC: 32.6 g/dL (ref 31.5–36.0)
MCV: 92 fL (ref 79.5–101.0)
MONO#: 0.2 10*3/uL (ref 0.1–0.9)
MONO%: 7.5 % (ref 0.0–14.0)
NEUT#: 1.7 10*3/uL (ref 1.5–6.5)
NEUT%: 67.6 % (ref 38.4–76.8)
Platelets: 106 10*3/uL — ABNORMAL LOW (ref 145–400)
RBC: 3.85 10*6/uL (ref 3.70–5.45)
RDW: 13.6 % (ref 11.2–14.5)
WBC: 2.4 10*3/uL — ABNORMAL LOW (ref 3.9–10.3)

## 2013-09-18 LAB — BASIC METABOLIC PANEL (CC13)
Anion Gap: 8 mEq/L (ref 3–11)
CO2: 25 mEq/L (ref 22–29)
Calcium: 9.2 mg/dL (ref 8.4–10.4)
Creatinine: 0.8 mg/dL (ref 0.6–1.1)
Glucose: 96 mg/dl (ref 70–140)
Sodium: 141 mEq/L (ref 136–145)

## 2013-09-18 MED ORDER — HEPARIN SOD (PORK) LOCK FLUSH 100 UNIT/ML IV SOLN
500.0000 [IU] | Freq: Once | INTRAVENOUS | Status: AC
  Administered 2013-09-18: 250 [IU] via INTRAVENOUS
  Filled 2013-09-18: qty 5

## 2013-09-18 MED ORDER — SODIUM CHLORIDE 0.9 % IJ SOLN
10.0000 mL | INTRAMUSCULAR | Status: DC | PRN
  Administered 2013-09-18 (×2): 10 mL via INTRAVENOUS
  Filled 2013-09-18: qty 10

## 2013-09-18 MED ORDER — LIDOCAINE (ANORECTAL) 5 % EX CREA: TOPICAL_CREAM | CUTANEOUS | Status: DC

## 2013-09-18 MED ORDER — OXYCODONE-ACETAMINOPHEN 5-325 MG PO TABS: 1.0000 | ORAL_TABLET | ORAL | Status: DC | PRN

## 2013-09-18 MED ORDER — SODIUM CHLORIDE 0.9 % IJ SOLN
10.0000 mL | INTRAMUSCULAR | Status: DC | PRN
  Filled 2013-09-18: qty 10

## 2013-09-18 NOTE — Telephone Encounter (Signed)
appts made and printed...td 

## 2013-09-18 NOTE — Patient Instructions (Signed)
Peripherally Inserted Central Catheter (PICC) Home Guide A peripherally inserted central catheter (PICC) is a long, thin, flexible tube that is inserted into a vein in the upper arm. It is a form of intravenous (IV) access. It is considered to be a "central" line because the tip of the PICC ends in a large vein in your chest. This large vein is called the superior vena cava (SVC). The PICC tip ends in the SVC because there is a lot of blood flow in the SVC. This allows medicines and IV fluids to be quickly distributed throughout the body. The PICC is inserted using a sterile technique by a specially trained nurse or physician. After the PICC is inserted, a chest X-ray is done to be sure it is in the correct place.  A PICC may be placed for different reasons, such as:  To give medicines and liquid nutrition that can only be given through a central line. Examples are:  Certain antibiotic treatments.  Chemotherapy.  Total parenteral nutrition (TPN).  To take frequent blood samples.  To give IV fluids and blood products.  If there is difficulty placing a peripheral intravenous (PIV) catheter. If taken care of properly, a PICC can remain in place for several months. A PICC can also allow patients to go home early. Medicine and PICC care can be managed at home by a family member or home healthcare team. RISKS AND COMPLICATIONS Possible problems with a PICC can occasionally occur. This may include:  A clot (thrombus) forming in or at the tip of the PICC. This can cause the PICC to become clogged. A "clot-busting" medicine called tissue plasminogen activator (tPA) can be inserted into the PICC to help break up the clot.  Inflammation of the vein (phlebitis) in which the PICC is placed. Signs of inflammation may include redness, pain at the insertion site, red streaks, or being able to feel a "cord" in the vein where the PICC is located.  Infection in the PICC or at the insertion site. Signs of  infection may include fever, chills, redness, swelling, or pus drainage from the PICC insertion site.  PICC movement (malposition). The PICC tip may migrate from its original position due to excessive physical activity, forceful coughing, sneezing, or vomiting.  A break or cut in the PICC. It is important to not use scissors near the PICC.  Nerve or tendon irritation or injury during PICC insertion. HOME CARE INSTRUCTIONS Activity  You may bend your arm and move it freely. If your PICC is near or at the bend of your elbow, avoid activity with repeated motion at the elbow.  Avoid lifting heavy objects as instructed by your caregiver.  Avoid using a crutch with the arm on the same side as your PICC. You may need to use a walker. PICC Dressing  Keep your PICC bandage (dressing) clean and dry to prevent infection.  Ask your caregiver when you may shower. Ask your caregiver to teach you how to wrap the PICC when you do take a shower.  Do not bathe, swim, or use hot tubs when you have a PICC.  Change the PICC dressing as instructed by your caregiver.  Change your PICC dressing if it becomes loose or wet. General PICC Care  Check the PICC insertion site daily for leakage, redness, swelling, or pain.  Flush the PICC as directed by your caregiver. Let your caregiver know right away if the PICC is difficult to flush or does not flush. Do not use force   to flush the PICC.  Do not use a syringe that is less than 10 mLs to flush the PICC.  Never pull or tug on the PICC.  Avoid blood pressure checks on the arm with the PICC.  Keep your PICC identification card with you at all times.  Do not take the PICC out yourself. Only a trained clinical professional should remove the PICC. SEEK IMMEDIATE MEDICAL CARE IF:  Your PICC is accidently pulled all the way out. If this happens, cover the insertion site with a bandage or gauze dressing. Do not throw the PICC away. Your caregiver will need to  inspect it.  Your PICC was tugged or pulled and has partially come out. Do not  push the PICC back in.  There is any type of drainage, redness, or swelling where the PICC enters the skin.  You cannot flush the PICC, it is difficult to flush, or the PICC leaks around the insertion site when it is flushed.  You hear a "flushing" sound when the PICC is flushed.  You have pain, discomfort, or numbness in your arm, shoulder, or jaw on the same side as the PICC .  You feel your heart "racing" or skipping beats.  You notice a hole or tear in the PICC.  You develop chills or a fever. MAKE SURE YOU:   Understand these instructions.  Will watch your condition.  Will get help right away if you are not doing well or get worse. Document Released: 05/14/2003 Document Revised: 01/30/2012 Document Reviewed: 03/14/2011 Exit[employer] Patient Information 2014 [employer].  

## 2013-09-18 NOTE — Progress Notes (Unsigned)
Patient husband stopped me in the hall requesting rxs for pwercocet and rectocare cream,, spoke with MD and rx's written and patient spouse signed for rxs'  And said to thank MD and this RN and informed Miranda,therapist to let patient know she has rx's and doesn't need to come to nursing this am 9:19 AM

## 2013-09-18 NOTE — Progress Notes (Signed)
Met with patient to assess for needs.  Referral made to outpatient rehab PT, Eulis Foster, per request of Dr. Truett Perna.  Patient informed and in agreement.  No other needs at this time.

## 2013-09-18 NOTE — Progress Notes (Signed)
OFFICE PROGRESS NOTE  Interval history:  Erin [name] is a 57 year old woman recently diagnosed with anal cancer. She began radiation and cycle 1 5-FU/mitomycin C. on 09/09/2013.   She is seen today for scheduled followup.   On day 3 she reports developing nausea and "dry heaves". Compazine and Zofran were effective. She subsequently developed mouth sores and thrush. She received IV fluids in the office last week. She continues to have mouth sores but is tolerating fluids without difficulty. Bowel movements continue to be painful. No recent rectal bleeding. She continues to have a skin rash at the right face, right and left neck and scalp. The rash is extremely pruritic. She is applying calamine lotion and taking Benadryl.  She reports she has quit smoking.    Objective: Blood pressure 128/96, pulse 80, temperature 97.4 F (36.3 C), temperature source Oral, resp. rate 19, height 5\' 3"  (1.6 m), weight 108 lb 14.4 oz (49.397 kg).  Ulcerations along the lateral edges of the tongue and at the left buccal mucosa. No thrush. Lungs are clear. Regular cardiac rhythm. Abdomen soft and nontender. No hepatomegaly. Extremities are without edema. No perianal/perineal erythema or skin breakdown. PICC site is nontender and without erythema. Multiple tiny skin lesions, some scabbed, at the right face, right and left neck and scalp.  Lab Results: Lab Results  Component Value Date   WBC 2.4* 09/18/2013   HGB 11.5* 09/18/2013   HCT 35.4 09/18/2013   MCV 92.0 09/18/2013   PLT 106* 09/18/2013    Chemistry:    Chemistry      Component Value Date/Time   NA 141 09/18/2013 1027   NA 142 07/30/2013 0903   K 4.0 09/18/2013 1027   K 4.8 07/30/2013 0903   CL 111 07/30/2013 0903   CO2 25 09/18/2013 1027   CO2 25 07/30/2013 0903   BUN 9.3 09/18/2013 1027   BUN 18 07/30/2013 0903   CREATININE 0.8 09/18/2013 1027   CREATININE 0.8 07/30/2013 0903      Component Value Date/Time   CALCIUM 9.2 09/18/2013 1027   CALCIUM  8.6 07/30/2013 0903   ALKPHOS 75 08/29/2013 1119   ALKPHOS 50 08/05/2013 1247   AST 13 08/29/2013 1119   AST 16 08/05/2013 1247   ALT <6 08/29/2013 1119   ALT 8 08/05/2013 1247   BILITOT 0.22 08/29/2013 1119   BILITOT 0.5 08/05/2013 1247       Studies/Results: Nm Pet Image Initial (pi) Skull Base To Thigh  08/29/2013   CLINICAL DATA:  Initial treatment strategy for anal cancer.  EXAM: NUCLEAR MEDICINE PET SKULL BASE TO THIGH  FASTING BLOOD GLUCOSE:  Value:  89 mg/dl  TECHNIQUE: 16.1 mCi W-96 FDG was injected intravenously. CT data was obtained and used for attenuation correction and anatomic localization only. (This was not acquired as a diagnostic CT examination.) Additional exam technical data entered on technologist worksheet.  COMPARISON:  CT scan of 08/07/2013  FINDINGS: NECK  No hypermetabolic lymph nodes in the neck.  CHEST  No hypermetabolic mediastinal or hilar nodes. No suspicious pulmonary nodules on the CT scan.  ABDOMEN/PELVIS  Hypermetabolic activity in the anal region, maximum standard uptake value 20.7.  Aortoiliac atherosclerosis noted.  SKELETON  No focal hypermetabolic activity to suggest skeletal metastasis.  IMPRESSION: 1. Hypermetabolic activity in the anal region, measuring up to a maximum standard uptake value of 20.7. Appearance compatible with anal carcinoma. No regional adenopathy observed. No findings of metastatic disease. There is some physiologic activity in the adjacent rectum,  making it difficult to ascertain the upper margin of the mass.   Electronically Signed   By: Herbie Baltimore M.D.   On: 08/29/2013 15:43   Ir Fluoro Guide Cv Line Right  09/06/2013   *RADIOLOGY REPORT*  Indication: History of rectal carcinoma, in need of PICC line for chemotherapy administration  ULTRASOUND AND FLUORSCOPIC GUIDED PICC LINE INSERTION  Intravenous Medications: None  Contrast: None  Fluoroscopy Time:  6 seconds  Complications: None immediate  Technique / Findings:  The procedure, risks,  benefits, and alternatives were explained to the patient and informed written consent was obtained.  A timeout was performed prior to the initiation of the procedure.  The right upper extremity was prepped with chlorhexidine in a sterile fashion, and a sterile drape was applied covering the operative field.  Maximum barrier sterile technique with sterile gowns and gloves were used for the procedure.  A timeout was performed prior to the initiation of the procedure.  Local anesthesia was provided with 1% lidocaine.  Under direct ultrasound guidance, the rightbasilicvein was accessed with a micropuncture kit after the overlying soft tissues were anesthetized with 1% lidocaine.  An ultrasound image was saved for documentation purposes.  A guidewire was advanced to the level of the superior caval-atrial junction for measurement purposes and the PICC line was cut to length.  A peel-away sheath was placed and a 40 cm, 5 [place], dual lumen was inserted to level of the superior caval-atrial junction.  A post procedure spot fluoroscopic was obtained.  The catheter easily aspirated and flushed and was sutured in place.  A dressing was placed.  The patient tolerated the procedure well without immediate post procedural complication.  Impression:  Successful ultrasound and fluoroscopic guided placement of a right basilic vein approach, 40 cm, 5 French,dual lumen PICC with tip at the superior caval-atrial junction.  The PICC line is ready for immediate use.   Original Report Authenticated By: Tacey Ruiz, MD   Ir US Guide Vasc Access Right  09/06/2013   *RADIOLOGY REPORT*  Indication: History of rectal carcinoma, in need of PICC line for chemotherapy administration  ULTRASOUND AND FLUORSCOPIC GUIDED PICC LINE INSERTION  Intravenous Medications: None  Contrast: None  Fluoroscopy Time:  6 seconds  Complications: None immediate  Technique / Findings:  The procedure, risks, benefits, and alternatives were explained to the  patient and informed written consent was obtained.  A timeout was performed prior to the initiation of the procedure.  The right upper extremity was prepped with chlorhexidine in a sterile fashion, and a sterile drape was applied covering the operative field.  Maximum barrier sterile technique with sterile gowns and gloves were used for the procedure.  A timeout was performed prior to the initiation of the procedure.  Local anesthesia was provided with 1% lidocaine.  Under direct ultrasound guidance, the rightbasilicvein was accessed with a micropuncture kit after the overlying soft tissues were anesthetized with 1% lidocaine.  An ultrasound image was saved for documentation purposes.  A guidewire was advanced to the level of the superior caval-atrial junction for measurement purposes and the PICC line was cut to length.  A peel-away sheath was placed and a 40 cm, 5 [place], dual lumen was inserted to level of the superior caval-atrial junction.  A post procedure spot fluoroscopic was obtained.  The catheter easily aspirated and flushed and was sutured in place.  A dressing was placed.  The patient tolerated the procedure well without immediate post procedural complication.  Impression:  Successful ultrasound and fluoroscopic guided placement of a right basilic vein approach, 40 cm, 5 French,dual lumen PICC with tip at the superior caval-atrial junction.  The PICC line is ready for immediate use.   Original Report Authenticated By: Tacey Ruiz, MD    Medications: I have reviewed the patient's current medications.  Assessment/Plan:  1. Squamous cell carcinoma of the anus, clinical stage II (T2 N0), status post endoscopic biopsy 08/14/2013 confirming invasive squamous cell carcinoma. Initiation of radiation and cycle 1 5-FU/mitomycin C. 09/09/2013. 2. Rectal pain, constipation and bleeding secondary to #1. The bleeding has lessened. 3. Iron deficiency anemia. 4. Vitamin B12  deficiency. 5. Diabetes. 6. Hyperlipidemia. 7. Neurodermatitis. 8. Tobacco use. She reports she has quit smoking. 9. Mucositis secondary to chemotherapy.  10. Skin rash. Likely related to the chemotherapy though somewhat atypical in appearance and distribution.  Disposition-Ms. Barkdull appears stable. She continues radiation. She developed mucositis and a skin rash both likely related to the chemotherapy. She will continue supportive measures for both. Dr. Truett Perna will consider a dose reduction of the chemotherapy with cycle 2 due to the mucositis.  At present she is able to maintain adequate hydration orally. She will contact the office if she feels she needs IV fluids.  We will see her in followup prior to proceeding with cycle 2 5-FU/mitomycin C. on 10/07/2013.  Plan reviewed with Dr. Truett Perna.   Lonna Cobb ANP/GNP-BC

## 2013-09-19 ENCOUNTER — Encounter: Payer: Self-pay | Admitting: *Deleted

## 2013-09-19 ENCOUNTER — Telehealth: Payer: Self-pay | Admitting: *Deleted

## 2013-09-19 ENCOUNTER — Ambulatory Visit
Admission: RE | Admit: 2013-09-19 | Discharge: 2013-09-19 | Disposition: A | Discharge status: Home / Self Care | Payer: No Typology Code available for payment source | Source: Ambulatory Visit | Attending: Radiation Oncology | Admitting: Radiation Oncology

## 2013-09-19 NOTE — Progress Notes (Unsigned)
[facility] called to clarify rect care/licocaine 5%  Rx, It comes in ointment not cream ,okay to give as ointment per MD 11:17 AM

## 2013-09-19 NOTE — Telephone Encounter (Signed)
error 

## 2013-09-20 ENCOUNTER — Ambulatory Visit (HOSPITAL_BASED_OUTPATIENT_CLINIC_OR_DEPARTMENT_OTHER): Payer: No Typology Code available for payment source

## 2013-09-20 ENCOUNTER — Ambulatory Visit
Admission: RE | Admit: 2013-09-20 | Discharge: 2013-09-20 | Disposition: A | Discharge status: Home / Self Care | Payer: No Typology Code available for payment source | Source: Ambulatory Visit | Attending: Radiation Oncology | Admitting: Radiation Oncology

## 2013-09-20 ENCOUNTER — Encounter: Payer: Self-pay | Admitting: Radiation Oncology

## 2013-09-20 VITALS — BP 136/75 | HR 100 | Temp 97.0°F | Resp 20

## 2013-09-20 VITALS — BP 99/60 | HR 85 | Temp 98.0°F | Resp 20 | Wt 109.0 lb

## 2013-09-20 DIAGNOSIS — Z452 Encounter for adjustment and management of vascular access device: Secondary | ICD-10-CM

## 2013-09-20 DIAGNOSIS — Z95828 Presence of other vascular implants and grafts: Secondary | ICD-10-CM

## 2013-09-20 DIAGNOSIS — C210 Malignant neoplasm of anus, unspecified: Secondary | ICD-10-CM

## 2013-09-20 MED ORDER — NYSTATIN 100000 UNIT/ML MT SUSP: 500000.0000 [IU] | Freq: Four times a day (QID) | OROMUCOSAL | Status: DC

## 2013-09-20 MED ORDER — HEPARIN SOD (PORK) LOCK FLUSH 100 UNIT/ML IV SOLN
500.0000 [IU] | Freq: Once | INTRAVENOUS | Status: AC
  Administered 2013-09-20: 500 [IU] via INTRAVENOUS
  Filled 2013-09-20: qty 5

## 2013-09-20 MED ORDER — SODIUM CHLORIDE 0.9 % IJ SOLN
10.0000 mL | INTRAMUSCULAR | Status: DC | PRN
  Filled 2013-09-20: qty 10

## 2013-09-20 MED ORDER — SODIUM CHLORIDE 0.9 % IJ SOLN
10.0000 mL | INTRAMUSCULAR | Status: DC | PRN
  Administered 2013-09-20: 10 mL via INTRAVENOUS
  Filled 2013-09-20: qty 10

## 2013-09-20 MED ORDER — HEPARIN SOD (PORK) LOCK FLUSH 100 UNIT/ML IV SOLN
500.0000 [IU] | Freq: Once | INTRAVENOUS | Status: AC
  Administered 2013-09-20: 250 [IU] via INTRAVENOUS
  Filled 2013-09-20: qty 5

## 2013-09-20 NOTE — Progress Notes (Signed)
Weekl;y rad tx 9/ completed, patient in a lot of pain, has ulcers on both sides of tongue and rash on neck scalp from chemotherapy, has nystatin suspension  Needs refill for that, and pain mostly in her bottom, patient lying on side, will start using  Sitz bath when she gets home pain 06-21-09 scale Had bm this am and started having rectal pain from that,eating fair,  9:50 AM

## 2013-09-20 NOTE — Progress Notes (Signed)
Department of Radiation Oncology  Phone:  [phone] Fax:        [phone]  Weekly Treatment Note    Name: Erin [name] Date: 09/20/2013 MRN: [id number] DOB: 06-Apr-1956   Current dose: 16.2  Gy  Current fraction: 9   MEDICATIONS: Current Outpatient Prescriptions  Medication Sig Dispense Refill  . Aspirin-Salicylamide-Caffeine (BC HEADACHE POWDER PO) Take by mouth as needed.      . bisacodyl (DULCOLAX) 5 MG EC tablet Take 15 mg by mouth daily as needed for constipation.      . cimetidine (TAGAMET) 200 MG tablet Take 200 mg by mouth as needed.      . docusate sodium (COLACE) 100 MG capsule Take 100 mg by mouth 2 (two) times daily.      . ferrous sulfate 325 (65 FE) MG tablet Take 325 mg by mouth 2 (two) times daily.      . fluticasone (FLONASE) 50 MCG/ACT nasal spray Place 2 sprays into the nose daily.  16 g  6  . gabapentin (NEURONTIN) 600 MG tablet TAKE 1 TABLET THREE TIMES A DAY  180 tablet  1  . Lidocaine, Anorectal, 5 % CREA Apply 1 application topically as needed. Apply to affected area prn, is an ointment      . nystatin (MYCOSTATIN) 100000 UNIT/ML suspension Take 5 mLs (500,000 Units total) by mouth 4 (four) times daily. Swish and swallow  120 mL  0  . ondansetron (ZOFRAN) 8 MG tablet Take 1 tablet (8 mg total) by mouth every 12 (twelve) hours as needed for nausea.  40 tablet  0  . OVER THE COUNTER MEDICATION Apply 1 application topically 3 (three) times daily as needed (RectiCare).       Marland Kitchen oxyCODONE-acetaminophen (PERCOCET/ROXICET) 5-325 MG per tablet Take 1 tablet by mouth every 4 (four) hours as needed for pain. May take 2 tablets if needed every 4-6 hours.  60 tablet  0  . Probiotic Product (PROBIOTIC DAILY PO) Take by mouth daily.       . prochlorperazine (COMPAZINE) 10 MG tablet Take 1 tablet (10 mg total) by mouth every 6 (six) hours as needed (nausea).  60 tablet  1  . varenicline (CHANTIX CONTINUING MONTH PAK) 1 MG tablet Take 1 tablet (1 mg total) by  mouth 2 (two) times daily.  60 tablet  1  . varenicline (CHANTIX STARTING MONTH PAK) 0.5 MG X 11 & 1 MG X 42 tablet Take one 0.5 mg tablet by mouth once daily for 3 days, then increase to one 0.5 mg tablet twice daily for 4 days, then increase to one 1 mg tablet twice daily.  53 tablet  0  . vitamin B-12 (CYANOCOBALAMIN) 1000 MCG tablet Place 1,000 mcg under the tongue daily.      . vitamin E 1000 UNIT capsule Take 1,000 Units by mouth daily.       No current facility-administered medications for this encounter.     ALLERGIES: Dexamethasone; Claritin; Sulfa antibiotics; and Prednisone   LABORATORY DATA:  Lab Results  Component Value Date   WBC 2.4* 09/18/2013   HGB 11.5* 09/18/2013   HCT 35.4 09/18/2013   MCV 92.0 09/18/2013   PLT 106* 09/18/2013   Lab Results  Component Value Date   NA 141 09/18/2013   K 4.0 09/18/2013   CL 111 07/30/2013   CO2 25 09/18/2013   Lab Results  Component Value Date   ALT <6 08/29/2013   AST 13 08/29/2013   ALKPHOS 75  08/29/2013   BILITOT 0.22 08/29/2013     NARRATIVE: Erin [name] was seen today for weekly treatment management. The chart was checked and the patient's films were reviewed. The patient had a bowel movement earlier today which has prompted some anal/rectal pain. Otherwise doing satisfactorily. She does have mucositis and needs a refill for nystatin. She will begin using sitz baths.  PHYSICAL EXAMINATION: weight is 109 lb (49.442 kg). Her oral temperature is 98 F (36.7 C). Her blood pressure is 99/60 and her pulse is 85. Her respiration is 20.      mucositis present within the oral cavity.  ASSESSMENT: The patient is doing satisfactorily with treatment.  PLAN: We will continue with the patient's radiation treatment as planned.

## 2013-09-23 ENCOUNTER — Ambulatory Visit
Admission: RE | Admit: 2013-09-23 | Discharge: 2013-09-23 | Disposition: A | Discharge status: Home / Self Care | Payer: No Typology Code available for payment source | Source: Ambulatory Visit | Attending: Radiation Oncology | Admitting: Radiation Oncology

## 2013-09-23 ENCOUNTER — Ambulatory Visit (HOSPITAL_BASED_OUTPATIENT_CLINIC_OR_DEPARTMENT_OTHER): Payer: No Typology Code available for payment source

## 2013-09-23 VITALS — Wt 107.5 lb

## 2013-09-23 DIAGNOSIS — C211 Malignant neoplasm of anal canal: Secondary | ICD-10-CM

## 2013-09-23 DIAGNOSIS — C210 Malignant neoplasm of anus, unspecified: Secondary | ICD-10-CM

## 2013-09-23 DIAGNOSIS — Z4682 Encounter for fitting and adjustment of non-vascular catheter: Secondary | ICD-10-CM

## 2013-09-23 DIAGNOSIS — Z452 Encounter for adjustment and management of vascular access device: Secondary | ICD-10-CM

## 2013-09-23 LAB — CBC WITH DIFFERENTIAL/PLATELET
EOS%: 6.8 % (ref 0.0–7.0)
Eosinophils Absolute: 0.1 10*3/uL (ref 0.0–0.5)
LYMPH%: 21.9 % (ref 14.0–49.7)
MCH: 30.1 pg (ref 25.1–34.0)
MCHC: 33.4 g/dL (ref 31.5–36.0)
MCV: 90.2 fL (ref 79.5–101.0)
MONO%: 27.1 % — ABNORMAL HIGH (ref 0.0–14.0)
NEUT#: 0.8 10*3/uL — ABNORMAL LOW (ref 1.5–6.5)
Platelets: 95 10*3/uL — ABNORMAL LOW (ref 145–400)
RBC: 3.87 10*6/uL (ref 3.70–5.45)
RDW: 12.9 % (ref 11.2–14.5)

## 2013-09-23 MED ORDER — SODIUM CHLORIDE 0.9 % IJ SOLN
10.0000 mL | INTRAMUSCULAR | Status: DC | PRN
  Administered 2013-09-23: 10 mL via INTRAVENOUS
  Filled 2013-09-23: qty 10

## 2013-09-23 MED ORDER — HEPARIN SOD (PORK) LOCK FLUSH 100 UNIT/ML IV SOLN
500.0000 [IU] | Freq: Once | INTRAVENOUS | Status: AC
  Administered 2013-09-23: 250 [IU] via INTRAVENOUS
  Filled 2013-09-23: qty 5

## 2013-09-23 NOTE — Progress Notes (Signed)
Weekly rad txs, anal 11 completed,

## 2013-09-23 NOTE — Progress Notes (Signed)
Weekly rad tx anal 11 completed, had 2 blood clots Saturday size of thumb darkl red with bowel movements, and black tarry stool today, paijn a 5 on 1-10 scale  After taking pain med, not eatibng, last foosd on Saturday cheese toast, drinking only cokes,  12:07 PM

## 2013-09-24 ENCOUNTER — Ambulatory Visit
Admission: RE | Admit: 2013-09-24 | Discharge: 2013-09-24 | Disposition: A | Discharge status: Home / Self Care | Payer: No Typology Code available for payment source | Source: Ambulatory Visit | Attending: Radiation Oncology | Admitting: Radiation Oncology

## 2013-09-24 ENCOUNTER — Telehealth: Payer: Self-pay | Admitting: Oncology

## 2013-09-24 DIAGNOSIS — C210 Malignant neoplasm of anus, unspecified: Secondary | ICD-10-CM

## 2013-09-24 MED ORDER — FLUCONAZOLE 150 MG PO TABS: 150.0000 mg | ORAL_TABLET | Freq: Once | ORAL | Status: DC

## 2013-09-24 NOTE — Progress Notes (Signed)
Patient seen yesterday didn't mention possible yeast  Infection, patient says now yes, blister left side labia and odorous ,itching, rad tx 12 completed today , requesting something for the yeast infection 9:22 AM

## 2013-09-24 NOTE — Progress Notes (Signed)
[facility]    Radiation Oncology [address] [place]     Maryln Gottron, M.D. [place], [place] [address]               Billie Lade, M.D., Ph.D. Phone: [phone]      Molli Hazard A. Kathrynn Running, M.D. Fax: [phone]      Radene Gunning, M.D., Ph.D.         Lurline Hare, M.D.         Grayland Jack, M.D Weekly Treatment Management Note  Name: Erin [name]     MRN: [id number]        CSN: [id number] Date: 09/24/2013      DOB: 28-Jun-1956  CC: Erin [name]., DO         Kim    Status: Outpatient  Diagnosis: The encounter diagnosis was Anal cancer.  Current Dose: 21.6 Gy  Current Fraction: 12  Planned Dose: 50.4 Gy  Narrative: Erin [name] was seen today for weekly treatment management. The chart was checked and MVCT  were reviewed. . Patient asked to be seen today for complaints of vaginal drainage and a " ulcer " along the left labia. Patient has a history of recurrent vaginal candidiasis and feels she has developed this issue. She has been on nystatin oral suspension for a yeast infection involving the oral cavity.  Dexamethasone; Claritin; Sulfa antibiotics; and Prednisone Current Outpatient Prescriptions  Medication Sig Dispense Refill  . Aspirin-Salicylamide-Caffeine (BC HEADACHE POWDER PO) Take by mouth as needed.      . bisacodyl (DULCOLAX) 5 MG EC tablet Take 15 mg by mouth daily as needed for constipation.      . cimetidine (TAGAMET) 200 MG tablet Take 200 mg by mouth as needed.      . docusate sodium (COLACE) 100 MG capsule Take 100 mg by mouth 2 (two) times daily.      . ferrous sulfate 325 (65 FE) MG tablet Take 325 mg by mouth 2 (two) times daily.      . fluticasone (FLONASE) 50 MCG/ACT nasal spray Place 2 sprays into the nose daily.  16 g  6  . gabapentin (NEURONTIN) 600 MG tablet TAKE 1 TABLET THREE TIMES A DAY  180 tablet  1  . Lidocaine, Anorectal, 5 % CREA Apply 1 application topically as needed. Apply to affected area prn, is an ointment       . nystatin (MYCOSTATIN) 100000 UNIT/ML suspension Take 5 mLs (500,000 Units total) by mouth 4 (four) times daily. Swish and swallow  120 mL  0  . ondansetron (ZOFRAN) 8 MG tablet Take 1 tablet (8 mg total) by mouth every 12 (twelve) hours as needed for nausea.  40 tablet  0  . OVER THE COUNTER MEDICATION Apply 1 application topically 3 (three) times daily as needed ([facility]).       Marland Kitchen oxyCODONE-acetaminophen (PERCOCET/ROXICET) 5-325 MG per tablet Take 1 tablet by mouth every 4 (four) hours as needed for pain. May take 2 tablets if needed every 4-6 hours.  60 tablet  0  . Probiotic Product (PROBIOTIC DAILY PO) Take by mouth daily.       . prochlorperazine (COMPAZINE) 10 MG tablet Take 1 tablet (10 mg total) by mouth every 6 (six) hours as needed (nausea).  60 tablet  1  . varenicline (CHANTIX CONTINUING MONTH PAK) 1 MG tablet Take 1 tablet (1 mg total) by mouth 2 (two) times daily.  60 tablet  1  . varenicline (  CHANTIX STARTING MONTH PAK) 0.5 MG X 11 & 1 MG X 42 tablet Take one 0.5 mg tablet by mouth once daily for 3 days, then increase to one 0.5 mg tablet twice daily for 4 days, then increase to one 1 mg tablet twice daily.  53 tablet  0  . vitamin B-12 (CYANOCOBALAMIN) 1000 MCG tablet Place 1,000 mcg under the tongue daily.      . vitamin E 1000 UNIT capsule Take 1,000 Units by mouth daily.      . fluconazole (DIFLUCAN) 150 MG tablet Take 1 tablet (150 mg total) by mouth once.  2 tablet  0   No current facility-administered medications for this encounter.   Labs:  Lab Results  Component Value Date   WBC 1.7* 09/23/2013   HGB 11.6 09/23/2013   HCT 34.9 09/23/2013   MCV 90.2 09/23/2013   PLT 95* 09/23/2013   Lab Results  Component Value Date   CREATININE 0.8 09/18/2013   BUN 9.3 09/18/2013   NA 141 09/18/2013   K 4.0 09/18/2013   CL 111 07/30/2013   CO2 25 09/18/2013   Lab Results  Component Value Date   ALT <6 08/29/2013   AST 13 08/29/2013   BILITOT 0.22 08/29/2013    Physical  Examination:  vitals were not taken for this visit.   Wt Readings from Last 3 Encounters:  09/23/13 107 lb 8 oz (48.762 kg)  09/20/13 109 lb (49.442 kg)  09/18/13 108 lb 14.4 oz (49.397 kg)    The oral cavity is moist without secondary infection Lungs - Normal respiratory effort, chest expands symmetrically. Lungs are clear to auscultation, no crackles or wheezes.  Heart has regular rhythm and rate  Abdomen is soft and non tender with normal bowel sounds The skin in the pelvis area shows some mild erythema and hyperpigmentation changes.   examination of the left labia area reveals no significant ulcer. I do not appreciate any significant vaginal drainage or strong odor.  No skin breakdown noted in the perianal region                                                                Assessment:  Patient tolerating treatments well except for above issues. The patient may have a vaginal yeast infection and will be placed on Diflucan 150 mg x1 followed by second dose 72 hours later. I did discuss with the patient that her vaginal irritation may be related to her radiation treatments.  Plan: Continue treatment per original radiation prescription

## 2013-09-24 NOTE — Progress Notes (Signed)
Department of Radiation Oncology  Phone:  [phone] Fax:        [phone]  Weekly Treatment Note    Name: JOELEE [name] Date: 09/24/2013 MRN: [id number] DOB: January 26, 1956   Current dose: 19.8 Gy  Current fraction: 11   MEDICATIONS: Current Outpatient Prescriptions  Medication Sig Dispense Refill  . Aspirin-Salicylamide-Caffeine (BC HEADACHE POWDER PO) Take by mouth as needed.      . bisacodyl (DULCOLAX) 5 MG EC tablet Take 15 mg by mouth daily as needed for constipation.      . cimetidine (TAGAMET) 200 MG tablet Take 200 mg by mouth as needed.      . docusate sodium (COLACE) 100 MG capsule Take 100 mg by mouth 2 (two) times daily.      . ferrous sulfate 325 (65 FE) MG tablet Take 325 mg by mouth 2 (two) times daily.      . fluticasone (FLONASE) 50 MCG/ACT nasal spray Place 2 sprays into the nose daily.  16 g  6  . gabapentin (NEURONTIN) 600 MG tablet TAKE 1 TABLET THREE TIMES A DAY  180 tablet  1  . Lidocaine, Anorectal, 5 % CREA Apply 1 application topically as needed. Apply to affected area prn, is an ointment      . nystatin (MYCOSTATIN) 100000 UNIT/ML suspension Take 5 mLs (500,000 Units total) by mouth 4 (four) times daily. Swish and swallow  120 mL  0  . ondansetron (ZOFRAN) 8 MG tablet Take 1 tablet (8 mg total) by mouth every 12 (twelve) hours as needed for nausea.  40 tablet  0  . OVER THE COUNTER MEDICATION Apply 1 application topically 3 (three) times daily as needed ([facility]).       Marland Kitchen oxyCODONE-acetaminophen (PERCOCET/ROXICET) 5-325 MG per tablet Take 1 tablet by mouth every 4 (four) hours as needed for pain. May take 2 tablets if needed every 4-6 hours.  60 tablet  0  . Probiotic Product (PROBIOTIC DAILY PO) Take by mouth daily.       . prochlorperazine (COMPAZINE) 10 MG tablet Take 1 tablet (10 mg total) by mouth every 6 (six) hours as needed (nausea).  60 tablet  1  . varenicline (CHANTIX CONTINUING MONTH PAK) 1 MG tablet Take 1 tablet (1 mg total) by  mouth 2 (two) times daily.  60 tablet  1  . varenicline (CHANTIX STARTING MONTH PAK) 0.5 MG X 11 & 1 MG X 42 tablet Take one 0.5 mg tablet by mouth once daily for 3 days, then increase to one 0.5 mg tablet twice daily for 4 days, then increase to one 1 mg tablet twice daily.  53 tablet  0  . vitamin B-12 (CYANOCOBALAMIN) 1000 MCG tablet Place 1,000 mcg under the tongue daily.      . vitamin E 1000 UNIT capsule Take 1,000 Units by mouth daily.       No current facility-administered medications for this encounter.     ALLERGIES: Dexamethasone; Claritin; Sulfa antibiotics; and Prednisone   LABORATORY DATA:  Lab Results  Component Value Date   WBC 1.7* 09/23/2013   HGB 11.6 09/23/2013   HCT 34.9 09/23/2013   MCV 90.2 09/23/2013   PLT 95* 09/23/2013   Lab Results  Component Value Date   NA 141 09/18/2013   K 4.0 09/18/2013   CL 111 07/30/2013   CO2 25 09/18/2013   Lab Results  Component Value Date   ALT <6 08/29/2013   AST 13 08/29/2013   ALKPHOS 75 08/29/2013  BILITOT 0.22 08/29/2013     NARRATIVE: Erin [name] was seen today for weekly treatment management. The chart was checked and the patient's films were reviewed. The patient is doing well at this time. She states that she has had a little bit of rectal bleeding and noticed a black stool this morning. She states that she noticed to blood clots on Saturday, both fairly small. She has not been eating do to you decide as although the mucositis has been improving. She has been drinking only Coca-Cola.  PHYSICAL EXAMINATION: weight is 107 lb 8 oz (48.762 kg).        ASSESSMENT: The patient is doing satisfactorily with treatment. I discussed with the patient that her nutrition needs to significantly improve. Otherwise, she may end up in the hospital. I'm not sure how she will do the next time she experiences mucositis although she some states that she will take in more by mouth now since her mucositis has been improving. She agreed to  drink more fluids and other liquids.     PLAN: We will continue with the patient's radiation treatment as planned. She is to let us know if her bleeding continues or worsens. We discussed maintaining softer stools and I think that this is something that can be followed. I will obtain a CBC today.

## 2013-09-24 NOTE — Telephone Encounter (Signed)
Per 2nd 10/29 pof pt needs appt @ [facility] [place] w/Cheryl Wallace Cullens. S/w Tresa Endo @ office - referral is in [facility] and per Rhodia Albright will call pt w/appt. No other orders. appts already on schedule.

## 2013-09-25 ENCOUNTER — Ambulatory Visit (HOSPITAL_BASED_OUTPATIENT_CLINIC_OR_DEPARTMENT_OTHER): Payer: No Typology Code available for payment source

## 2013-09-25 ENCOUNTER — Ambulatory Visit
Admission: RE | Admit: 2013-09-25 | Discharge: 2013-09-25 | Disposition: A | Discharge status: Home / Self Care | Payer: No Typology Code available for payment source | Source: Ambulatory Visit | Attending: Radiation Oncology | Admitting: Radiation Oncology

## 2013-09-25 VITALS — BP 106/77 | HR 96 | Temp 97.9°F | Resp 18

## 2013-09-25 DIAGNOSIS — C211 Malignant neoplasm of anal canal: Secondary | ICD-10-CM

## 2013-09-25 DIAGNOSIS — Z452 Encounter for adjustment and management of vascular access device: Secondary | ICD-10-CM

## 2013-09-25 MED ORDER — HEPARIN SOD (PORK) LOCK FLUSH 100 UNIT/ML IV SOLN
500.0000 [IU] | Freq: Once | INTRAVENOUS | Status: AC
  Administered 2013-09-25: 250 [IU] via INTRAVENOUS
  Filled 2013-09-25: qty 5

## 2013-09-25 MED ORDER — SODIUM CHLORIDE 0.9 % IJ SOLN
10.0000 mL | INTRAMUSCULAR | Status: DC | PRN
  Administered 2013-09-25: 10 mL via INTRAVENOUS
  Filled 2013-09-25: qty 10

## 2013-09-25 NOTE — Patient Instructions (Signed)
Peripherally Inserted Central Catheter (PICC) Home Guide A peripherally inserted central catheter (PICC) is a long, thin, flexible tube that is inserted into a vein in the upper arm. It is a form of intravenous (IV) access. It is considered to be a "central" line because the tip of the PICC ends in a large vein in your chest. This large vein is called the superior vena cava (SVC). The PICC tip ends in the SVC because there is a lot of blood flow in the SVC. This allows medicines and IV fluids to be quickly distributed throughout the body. The PICC is inserted using a sterile technique by a specially trained nurse or physician. After the PICC is inserted, a chest X-ray is done to be sure it is in the correct place.  A PICC may be placed for different reasons, such as:  To give medicines and liquid nutrition that can only be given through a central line. Examples are:  Certain antibiotic treatments.  Chemotherapy.  Total parenteral nutrition (TPN).  To take frequent blood samples.  To give IV fluids and blood products.  If there is difficulty placing a peripheral intravenous (PIV) catheter. If taken care of properly, a PICC can remain in place for several months. A PICC can also allow patients to go home early. Medicine and PICC care can be managed at home by a family member or home healthcare team. RISKS AND COMPLICATIONS Possible problems with a PICC can occasionally occur. This may include:  A clot (thrombus) forming in or at the tip of the PICC. This can cause the PICC to become clogged. A "clot-busting" medicine called tissue plasminogen activator (tPA) can be inserted into the PICC to help break up the clot.  Inflammation of the vein (phlebitis) in which the PICC is placed. Signs of inflammation may include redness, pain at the insertion site, red streaks, or being able to feel a "cord" in the vein where the PICC is located.  Infection in the PICC or at the insertion site. Signs of  infection may include fever, chills, redness, swelling, or pus drainage from the PICC insertion site.  PICC movement (malposition). The PICC tip may migrate from its original position due to excessive physical activity, forceful coughing, sneezing, or vomiting.  A break or cut in the PICC. It is important to not use scissors near the PICC.  Nerve or tendon irritation or injury during PICC insertion. HOME CARE INSTRUCTIONS Activity  You may bend your arm and move it freely. If your PICC is near or at the bend of your elbow, avoid activity with repeated motion at the elbow.  Avoid lifting heavy objects as instructed by your caregiver.  Avoid using a crutch with the arm on the same side as your PICC. You may need to use a walker. PICC Dressing  Keep your PICC bandage (dressing) clean and dry to prevent infection.  Ask your caregiver when you may shower. Ask your caregiver to teach you how to wrap the PICC when you do take a shower.  Do not bathe, swim, or use hot tubs when you have a PICC.  Change the PICC dressing as instructed by your caregiver.  Change your PICC dressing if it becomes loose or wet. General PICC Care  Check the PICC insertion site daily for leakage, redness, swelling, or pain.  Flush the PICC as directed by your caregiver. Let your caregiver know right away if the PICC is difficult to flush or does not flush. Do not use force   to flush the PICC.  Do not use a syringe that is less than 10 mLs to flush the PICC.  Never pull or tug on the PICC.  Avoid blood pressure checks on the arm with the PICC.  Keep your PICC identification card with you at all times.  Do not take the PICC out yourself. Only a trained clinical professional should remove the PICC. SEEK IMMEDIATE MEDICAL CARE IF:  Your PICC is accidently pulled all the way out. If this happens, cover the insertion site with a bandage or gauze dressing. Do not throw the PICC away. Your caregiver will need to  inspect it.  Your PICC was tugged or pulled and has partially come out. Do not  push the PICC back in.  There is any type of drainage, redness, or swelling where the PICC enters the skin.  You cannot flush the PICC, it is difficult to flush, or the PICC leaks around the insertion site when it is flushed.  You hear a "flushing" sound when the PICC is flushed.  You have pain, discomfort, or numbness in your arm, shoulder, or jaw on the same side as the PICC .  You feel your heart "racing" or skipping beats.  You notice a hole or tear in the PICC.  You develop chills or a fever. MAKE SURE YOU:   Understand these instructions.  Will watch your condition.  Will get help right away if you are not doing well or get worse. Document Released: 05/14/2003 Document Revised: 01/30/2012 Document Reviewed: 03/14/2011 Exit[employer] Patient Information 2014 [employer].  

## 2013-09-26 ENCOUNTER — Telehealth: Payer: Self-pay | Admitting: *Deleted

## 2013-09-26 ENCOUNTER — Ambulatory Visit
Admission: RE | Admit: 2013-09-26 | Discharge: 2013-09-26 | Disposition: A | Discharge status: Home / Self Care | Payer: No Typology Code available for payment source | Source: Ambulatory Visit | Attending: Radiation Oncology | Admitting: Radiation Oncology

## 2013-09-26 ENCOUNTER — Other Ambulatory Visit: Payer: Self-pay

## 2013-09-26 NOTE — Telephone Encounter (Signed)
Called and spoke with husband, can pick up rx tomorrow at nursing, patient spouse said to thank Dr.kinard and this RN 5:00 PM

## 2013-09-26 NOTE — Telephone Encounter (Signed)
Patient spouse called requesting pain medication refill on percocet, last rx written 09/18/13, she is taking 5 daily and will run out Sunday, they can pick up rx tomorrow during her radiation treatment, Dr. Mitzi Hansen is off so I will forward this request to the on call MD  And one of the nurses can help with request tomorrow ,Erin [name]  Thanked this Rn 4:19 PM

## 2013-09-27 ENCOUNTER — Ambulatory Visit (HOSPITAL_BASED_OUTPATIENT_CLINIC_OR_DEPARTMENT_OTHER): Payer: No Typology Code available for payment source

## 2013-09-27 ENCOUNTER — Ambulatory Visit
Admission: RE | Admit: 2013-09-27 | Discharge: 2013-09-27 | Disposition: A | Discharge status: Home / Self Care | Payer: No Typology Code available for payment source | Source: Ambulatory Visit | Attending: Radiation Oncology | Admitting: Radiation Oncology

## 2013-09-27 VITALS — BP 115/71 | HR 81 | Temp 98.2°F

## 2013-09-27 DIAGNOSIS — C210 Malignant neoplasm of anus, unspecified: Secondary | ICD-10-CM

## 2013-09-27 DIAGNOSIS — Z452 Encounter for adjustment and management of vascular access device: Secondary | ICD-10-CM

## 2013-09-27 MED ORDER — SODIUM CHLORIDE 0.9 % IJ SOLN
10.0000 mL | INTRAMUSCULAR | Status: DC | PRN
  Administered 2013-09-27: 10 mL via INTRAVENOUS
  Filled 2013-09-27: qty 10

## 2013-09-27 MED ORDER — HEPARIN SOD (PORK) LOCK FLUSH 100 UNIT/ML IV SOLN
500.0000 [IU] | Freq: Once | INTRAVENOUS | Status: AC
  Administered 2013-09-27: 500 [IU] via INTRAVENOUS
  Filled 2013-09-27: qty 5

## 2013-09-27 NOTE — Patient Instructions (Signed)
Peripherally Inserted Central Catheter (PICC) Home Guide A peripherally inserted central catheter (PICC) is a long, thin, flexible tube that is inserted into a vein in the upper arm. It is a form of intravenous (IV) access. It is considered to be a "central" line because the tip of the PICC ends in a large vein in your chest. This large vein is called the superior vena cava (SVC). The PICC tip ends in the SVC because there is a lot of blood flow in the SVC. This allows medicines and IV fluids to be quickly distributed throughout the body. The PICC is inserted using a sterile technique by a specially trained nurse or physician. After the PICC is inserted, a chest X-ray is done to be sure it is in the correct place.  A PICC may be placed for different reasons, such as:  To give medicines and liquid nutrition that can only be given through a central line. Examples are:  Certain antibiotic treatments.  Chemotherapy.  Total parenteral nutrition (TPN).  To take frequent blood samples.  To give IV fluids and blood products.  If there is difficulty placing a peripheral intravenous (PIV) catheter. If taken care of properly, a PICC can remain in place for several months. A PICC can also allow patients to go home early. Medicine and PICC care can be managed at home by a family member or home healthcare team. RISKS AND COMPLICATIONS Possible problems with a PICC can occasionally occur. This may include:  A clot (thrombus) forming in or at the tip of the PICC. This can cause the PICC to become clogged. A "clot-busting" medicine called tissue plasminogen activator (tPA) can be inserted into the PICC to help break up the clot.  Inflammation of the vein (phlebitis) in which the PICC is placed. Signs of inflammation may include redness, pain at the insertion site, red streaks, or being able to feel a "cord" in the vein where the PICC is located.  Infection in the PICC or at the insertion site. Signs of  infection may include fever, chills, redness, swelling, or pus drainage from the PICC insertion site.  PICC movement (malposition). The PICC tip may migrate from its original position due to excessive physical activity, forceful coughing, sneezing, or vomiting.  A break or cut in the PICC. It is important to not use scissors near the PICC.  Nerve or tendon irritation or injury during PICC insertion. HOME CARE INSTRUCTIONS Activity  You may bend your arm and move it freely. If your PICC is near or at the bend of your elbow, avoid activity with repeated motion at the elbow.  Avoid lifting heavy objects as instructed by your caregiver.  Avoid using a crutch with the arm on the same side as your PICC. You may need to use a walker. PICC Dressing  Keep your PICC bandage (dressing) clean and dry to prevent infection.  Ask your caregiver when you may shower. Ask your caregiver to teach you how to wrap the PICC when you do take a shower.  Do not bathe, swim, or use hot tubs when you have a PICC.  Change the PICC dressing as instructed by your caregiver.  Change your PICC dressing if it becomes loose or wet. General PICC Care  Check the PICC insertion site daily for leakage, redness, swelling, or pain.  Flush the PICC as directed by your caregiver. Let your caregiver know right away if the PICC is difficult to flush or does not flush. Do not use force   to flush the PICC.  Do not use a syringe that is less than 10 mLs to flush the PICC.  Never pull or tug on the PICC.  Avoid blood pressure checks on the arm with the PICC.  Keep your PICC identification card with you at all times.  Do not take the PICC out yourself. Only a trained clinical professional should remove the PICC. SEEK IMMEDIATE MEDICAL CARE IF:  Your PICC is accidently pulled all the way out. If this happens, cover the insertion site with a bandage or gauze dressing. Do not throw the PICC away. Your caregiver will need to  inspect it.  Your PICC was tugged or pulled and has partially come out. Do not  push the PICC back in.  There is any type of drainage, redness, or swelling where the PICC enters the skin.  You cannot flush the PICC, it is difficult to flush, or the PICC leaks around the insertion site when it is flushed.  You hear a "flushing" sound when the PICC is flushed.  You have pain, discomfort, or numbness in your arm, shoulder, or jaw on the same side as the PICC .  You feel your heart "racing" or skipping beats.  You notice a hole or tear in the PICC.  You develop chills or a fever. MAKE SURE YOU:   Understand these instructions.  Will watch your condition.  Will get help right away if you are not doing well or get worse. Document Released: 05/14/2003 Document Revised: 01/30/2012 Document Reviewed: 03/14/2011 Exit[employer] Patient Information 2014 [employer].  

## 2013-09-30 ENCOUNTER — Telehealth: Payer: Self-pay | Admitting: Oncology

## 2013-09-30 ENCOUNTER — Ambulatory Visit (HOSPITAL_BASED_OUTPATIENT_CLINIC_OR_DEPARTMENT_OTHER): Payer: No Typology Code available for payment source

## 2013-09-30 ENCOUNTER — Ambulatory Visit
Admission: RE | Admit: 2013-09-30 | Discharge: 2013-09-30 | Disposition: A | Discharge status: Home / Self Care | Payer: No Typology Code available for payment source | Source: Ambulatory Visit | Attending: Radiation Oncology | Admitting: Radiation Oncology

## 2013-09-30 ENCOUNTER — Ambulatory Visit: Payer: No Typology Code available for payment source

## 2013-09-30 VITALS — BP 109/71 | HR 80 | Temp 97.9°F | Resp 18

## 2013-09-30 DIAGNOSIS — Z452 Encounter for adjustment and management of vascular access device: Secondary | ICD-10-CM

## 2013-09-30 DIAGNOSIS — C210 Malignant neoplasm of anus, unspecified: Secondary | ICD-10-CM

## 2013-09-30 MED ORDER — HEPARIN SOD (PORK) LOCK FLUSH 100 UNIT/ML IV SOLN
500.0000 [IU] | Freq: Once | INTRAVENOUS | Status: AC
  Administered 2013-09-30: 250 [IU] via INTRAVENOUS
  Filled 2013-09-30: qty 5

## 2013-09-30 MED ORDER — SODIUM CHLORIDE 0.9 % IJ SOLN
10.0000 mL | INTRAMUSCULAR | Status: DC | PRN
  Administered 2013-09-30: 10 mL via INTRAVENOUS
  Filled 2013-09-30: qty 10

## 2013-09-30 NOTE — Telephone Encounter (Signed)
pt came in a needs flushes on M,W,F...done

## 2013-10-01 ENCOUNTER — Ambulatory Visit
Admission: RE | Admit: 2013-10-01 | Discharge: 2013-10-01 | Disposition: A | Discharge status: Home / Self Care | Payer: No Typology Code available for payment source | Source: Ambulatory Visit | Attending: Radiation Oncology | Admitting: Radiation Oncology

## 2013-10-01 ENCOUNTER — Telehealth: Payer: Self-pay | Admitting: *Deleted

## 2013-10-01 ENCOUNTER — Ambulatory Visit: Payer: No Typology Code available for payment source

## 2013-10-01 NOTE — Telephone Encounter (Signed)
Patient called and stated she has that rash again ,hasn't had chemotherapy in over 3 weeks, thinks this might be from the pain medication side effects, is itching, and is taking benadryl and has put calamine lotion also over rash on her back and arms, she has already gone home today after her rad tx, she read up on the side effects of her oxycodone and wanted to see if this is why , also she feels her bottom might be getting yeast infection even though Dr.Kinard had checked and felt it was the radiation not yeast, will have her see Dr.Moody tomorrow after rad txs Will notify linac treatment machine to send over after tx 10:47 AM

## 2013-10-02 ENCOUNTER — Ambulatory Visit: Payer: No Typology Code available for payment source

## 2013-10-02 ENCOUNTER — Ambulatory Visit
Admission: RE | Admit: 2013-10-02 | Discharge: 2013-10-02 | Disposition: A | Discharge status: Home / Self Care | Payer: No Typology Code available for payment source | Source: Ambulatory Visit | Attending: Radiation Oncology | Admitting: Radiation Oncology

## 2013-10-02 ENCOUNTER — Ambulatory Visit (HOSPITAL_BASED_OUTPATIENT_CLINIC_OR_DEPARTMENT_OTHER): Payer: No Typology Code available for payment source

## 2013-10-02 ENCOUNTER — Ambulatory Visit: Payer: No Typology Code available for payment source | Admitting: Radiation Oncology

## 2013-10-02 ENCOUNTER — Encounter: Payer: Self-pay | Admitting: Radiation Oncology

## 2013-10-02 VITALS — BP 115/59 | HR 82 | Temp 98.4°F | Resp 20

## 2013-10-02 DIAGNOSIS — C210 Malignant neoplasm of anus, unspecified: Secondary | ICD-10-CM

## 2013-10-02 DIAGNOSIS — Z452 Encounter for adjustment and management of vascular access device: Secondary | ICD-10-CM

## 2013-10-02 MED ORDER — TRAMADOL HCL 50 MG PO TABS: 50.0000 mg | ORAL_TABLET | Freq: Four times a day (QID) | ORAL | Status: DC | PRN

## 2013-10-02 MED ORDER — HEPARIN SOD (PORK) LOCK FLUSH 100 UNIT/ML IV SOLN
500.0000 [IU] | Freq: Once | INTRAVENOUS | Status: AC
  Administered 2013-10-02: 250 [IU] via INTRAVENOUS
  Filled 2013-10-02: qty 5

## 2013-10-02 MED ORDER — SODIUM CHLORIDE 0.9 % IJ SOLN
10.0000 mL | Freq: Once | INTRAMUSCULAR | Status: AC
  Administered 2013-10-02: 3 mL via INTRAVENOUS
  Filled 2013-10-02: qty 10

## 2013-10-02 NOTE — Progress Notes (Signed)
Department of Radiation Oncology  Phone:  [phone] Fax:        [phone]  Weekly Treatment Note    Name: Erin [name] Date: 10/02/2013 MRN: [id number] DOB: 15-Jul-1956   Current dose: 32.4 Gy  Current fraction: 18   MEDICATIONS: Current Outpatient Prescriptions  Medication Sig Dispense Refill  . Aspirin-Salicylamide-Caffeine (BC HEADACHE POWDER PO) Take by mouth as needed.      . bisacodyl (DULCOLAX) 5 MG EC tablet Take 15 mg by mouth daily as needed for constipation.      . cimetidine (TAGAMET) 200 MG tablet Take 200 mg by mouth as needed.      . docusate sodium (COLACE) 100 MG capsule Take 100 mg by mouth 2 (two) times daily.      . ferrous sulfate 325 (65 FE) MG tablet Take 325 mg by mouth 2 (two) times daily.      . fluconazole (DIFLUCAN) 150 MG tablet Take 1 tablet (150 mg total) by mouth once.  2 tablet  0  . fluticasone (FLONASE) 50 MCG/ACT nasal spray Place 2 sprays into the nose daily.  16 g  6  . gabapentin (NEURONTIN) 600 MG tablet TAKE 1 TABLET THREE TIMES A DAY  180 tablet  1  . Lidocaine, Anorectal, 5 % CREA Apply 1 application topically as needed. Apply to affected area prn, is an ointment      . nystatin (MYCOSTATIN) 100000 UNIT/ML suspension Take 5 mLs (500,000 Units total) by mouth 4 (four) times daily. Swish and swallow  120 mL  0  . ondansetron (ZOFRAN) 8 MG tablet Take 1 tablet (8 mg total) by mouth every 12 (twelve) hours as needed for nausea.  40 tablet  0  . OVER THE COUNTER MEDICATION Apply 1 application topically 3 (three) times daily as needed ([facility]).       . Probiotic Product (PROBIOTIC DAILY PO) Take by mouth daily.       . prochlorperazine (COMPAZINE) 10 MG tablet Take 1 tablet (10 mg total) by mouth every 6 (six) hours as needed (nausea).  60 tablet  1  . traMADol (ULTRAM) 50 MG tablet Take 1 tablet (50 mg total) by mouth every 6 (six) hours as needed (1-2 every 6 hours as needed. Maximum 8 tablets per day.).  90 tablet  0  .  varenicline (CHANTIX CONTINUING MONTH PAK) 1 MG tablet Take 1 tablet (1 mg total) by mouth 2 (two) times daily.  60 tablet  1  . varenicline (CHANTIX STARTING MONTH PAK) 0.5 MG X 11 & 1 MG X 42 tablet Take one 0.5 mg tablet by mouth once daily for 3 days, then increase to one 0.5 mg tablet twice daily for 4 days, then increase to one 1 mg tablet twice daily.  53 tablet  0  . vitamin B-12 (CYANOCOBALAMIN) 1000 MCG tablet Place 1,000 mcg under the tongue daily.      . vitamin E 1000 UNIT capsule Take 1,000 Units by mouth daily.       No current facility-administered medications for this encounter.     ALLERGIES: Dexamethasone; Claritin; Sulfa antibiotics; and Prednisone   LABORATORY DATA:  Lab Results  Component Value Date   WBC 1.7* 09/23/2013   HGB 11.6 09/23/2013   HCT 34.9 09/23/2013   MCV 90.2 09/23/2013   PLT 95* 09/23/2013   Lab Results  Component Value Date   NA 141 09/18/2013   K 4.0 09/18/2013   CL 111 07/30/2013   CO2 25 09/18/2013  Lab Results  Component Value Date   ALT <6 08/29/2013   AST 13 08/29/2013   ALKPHOS 75 08/29/2013   BILITOT 0.22 08/29/2013     NARRATIVE: Erin [name] was seen today for weekly treatment management. The chart was checked and the patient's films were reviewed. Patient is doing relatively well with her treatment. She was treated for a possible yeast infection last week although it was felt that this may be simply radiation effect. The patient has a rash which returned which is present probability on the upper back. She had a rash before and it was felt that this may represent an allergic reaction to chemotherapy although this was felt to be an uncommon to that. She has not had this in several weeks and this has returned and therefore another cause appears more likely. She she feels that it may be due to her pain medicine as she is not taking a number of other medicines which are ER and.  PHYSICAL EXAMINATION: vitals were not taken for this visit.      follicular rash present diffusely in the upper back as described  ASSESSMENT: The patient is doing satisfactorily with treatment.  PLAN: We will continue with the patient's radiation treatment as planned. The patient requested another pain medicine and I do believe that this makes sense as a possible cause for her rash. She has taken Ultram in the past with good relief and therefore she was given this medication. She will discontinue Percocet.

## 2013-10-02 NOTE — Progress Notes (Signed)
Checked patient 's back, rash  Covering, c/o itching still, pain is controlled with percocet though, pt just very nervous about continuing  Pain med,request to see MD 9:20 AM

## 2013-10-02 NOTE — Patient Instructions (Signed)

## 2013-10-03 ENCOUNTER — Ambulatory Visit
Admission: RE | Admit: 2013-10-03 | Discharge: 2013-10-03 | Disposition: A | Discharge status: Home / Self Care | Payer: No Typology Code available for payment source | Source: Ambulatory Visit | Attending: Radiation Oncology | Admitting: Radiation Oncology

## 2013-10-03 ENCOUNTER — Ambulatory Visit: Payer: No Typology Code available for payment source

## 2013-10-04 ENCOUNTER — Ambulatory Visit
Admission: RE | Admit: 2013-10-04 | Discharge: 2013-10-04 | Disposition: A | Discharge status: Home / Self Care | Payer: No Typology Code available for payment source | Source: Ambulatory Visit | Attending: Radiation Oncology | Admitting: Radiation Oncology

## 2013-10-04 ENCOUNTER — Ambulatory Visit: Payer: No Typology Code available for payment source

## 2013-10-04 ENCOUNTER — Ambulatory Visit (HOSPITAL_BASED_OUTPATIENT_CLINIC_OR_DEPARTMENT_OTHER): Payer: No Typology Code available for payment source

## 2013-10-04 VITALS — BP 109/86 | HR 89 | Temp 98.2°F | Resp 18

## 2013-10-04 DIAGNOSIS — C210 Malignant neoplasm of anus, unspecified: Secondary | ICD-10-CM

## 2013-10-04 DIAGNOSIS — Z452 Encounter for adjustment and management of vascular access device: Secondary | ICD-10-CM

## 2013-10-04 MED ORDER — SODIUM CHLORIDE 0.9 % IJ SOLN
10.0000 mL | INTRAMUSCULAR | Status: DC | PRN
  Administered 2013-10-04: 10 mL via INTRAVENOUS
  Filled 2013-10-04: qty 10

## 2013-10-04 MED ORDER — HEPARIN SOD (PORK) LOCK FLUSH 100 UNIT/ML IV SOLN
500.0000 [IU] | Freq: Once | INTRAVENOUS | Status: AC
  Administered 2013-10-04: 500 [IU] via INTRAVENOUS
  Filled 2013-10-04: qty 5

## 2013-10-07 ENCOUNTER — Other Ambulatory Visit: Payer: No Typology Code available for payment source

## 2013-10-07 ENCOUNTER — Ambulatory Visit (HOSPITAL_BASED_OUTPATIENT_CLINIC_OR_DEPARTMENT_OTHER): Payer: No Typology Code available for payment source | Admitting: Nurse Practitioner

## 2013-10-07 ENCOUNTER — Ambulatory Visit
Admission: RE | Admit: 2013-10-07 | Discharge: 2013-10-07 | Disposition: A | Discharge status: Home / Self Care | Payer: No Typology Code available for payment source | Source: Ambulatory Visit | Attending: Radiation Oncology | Admitting: Radiation Oncology

## 2013-10-07 ENCOUNTER — Ambulatory Visit: Payer: PRIVATE HEALTH INSURANCE

## 2013-10-07 ENCOUNTER — Ambulatory Visit (HOSPITAL_BASED_OUTPATIENT_CLINIC_OR_DEPARTMENT_OTHER): Payer: No Typology Code available for payment source

## 2013-10-07 ENCOUNTER — Telehealth: Payer: Self-pay | Admitting: Oncology

## 2013-10-07 ENCOUNTER — Encounter: Payer: Self-pay | Admitting: Oncology

## 2013-10-07 ENCOUNTER — Other Ambulatory Visit (HOSPITAL_BASED_OUTPATIENT_CLINIC_OR_DEPARTMENT_OTHER): Payer: No Typology Code available for payment source | Admitting: Lab

## 2013-10-07 ENCOUNTER — Other Ambulatory Visit: Payer: No Typology Code available for payment source | Admitting: Lab

## 2013-10-07 ENCOUNTER — Ambulatory Visit: Payer: PRIVATE HEALTH INSURANCE | Admitting: Nutrition

## 2013-10-07 VITALS — BP 99/59 | HR 85 | Temp 98.2°F | Resp 18 | Ht 63.0 in | Wt 108.9 lb

## 2013-10-07 VITALS — BP 89/61 | HR 98 | Temp 98.6°F | Resp 18

## 2013-10-07 VITALS — BP 140/64 | HR 89

## 2013-10-07 DIAGNOSIS — R42 Dizziness and giddiness: Secondary | ICD-10-CM

## 2013-10-07 DIAGNOSIS — R21 Rash and other nonspecific skin eruption: Secondary | ICD-10-CM

## 2013-10-07 DIAGNOSIS — E538 Deficiency of other specified B group vitamins: Secondary | ICD-10-CM

## 2013-10-07 DIAGNOSIS — E860 Dehydration: Secondary | ICD-10-CM

## 2013-10-07 DIAGNOSIS — C210 Malignant neoplasm of anus, unspecified: Secondary | ICD-10-CM

## 2013-10-07 DIAGNOSIS — I959 Hypotension, unspecified: Secondary | ICD-10-CM

## 2013-10-07 DIAGNOSIS — D509 Iron deficiency anemia, unspecified: Secondary | ICD-10-CM

## 2013-10-07 LAB — CBC WITH DIFFERENTIAL/PLATELET
BASO%: 0.1 % (ref 0.0–2.0)
Basophils Absolute: 0 10*3/uL (ref 0.0–0.1)
Eosinophils Absolute: 0.3 10*3/uL (ref 0.0–0.5)
LYMPH%: 9.7 % — ABNORMAL LOW (ref 14.0–49.7)
MCH: 30.6 pg (ref 25.1–34.0)
MCHC: 33.2 g/dL (ref 31.5–36.0)
MONO#: 0.5 10*3/uL (ref 0.1–0.9)
MONO%: 6.9 % (ref 0.0–14.0)
NEUT#: 5.3 10*3/uL (ref 1.5–6.5)
Platelets: 237 10*3/uL (ref 145–400)
RBC: 3.56 10*6/uL — ABNORMAL LOW (ref 3.70–5.45)
RDW: 14.3 % (ref 11.2–14.5)
WBC: 6.7 10*3/uL (ref 3.9–10.3)
nRBC: 0 % (ref 0–0)

## 2013-10-07 LAB — COMPREHENSIVE METABOLIC PANEL (CC13)
ALT: 8 U/L (ref 0–55)
AST: 19 U/L (ref 5–34)
Albumin: 3.1 g/dL — ABNORMAL LOW (ref 3.5–5.0)
Anion Gap: 10 mEq/L (ref 3–11)
BUN: 10 mg/dL (ref 7.0–26.0)
CO2: 23 mEq/L (ref 22–29)
Chloride: 102 mEq/L (ref 98–109)
Creatinine: 0.7 mg/dL (ref 0.6–1.1)
Glucose: 98 mg/dl (ref 70–140)
Sodium: 136 mEq/L (ref 136–145)
Total Bilirubin: 0.24 mg/dL (ref 0.20–1.20)
Total Protein: 6 g/dL — ABNORMAL LOW (ref 6.4–8.3)

## 2013-10-07 MED ORDER — HYDROMORPHONE HCL 2 MG PO TABS: 2.0000 mg | ORAL_TABLET | ORAL | Status: DC | PRN

## 2013-10-07 MED ORDER — HEPARIN SOD (PORK) LOCK FLUSH 100 UNIT/ML IV SOLN
250.0000 [IU] | Freq: Once | INTRAVENOUS | Status: AC
  Administered 2013-10-07: 500 [IU] via INTRAVENOUS
  Filled 2013-10-07: qty 5

## 2013-10-07 MED ORDER — HEPARIN SOD (PORK) LOCK FLUSH 100 UNIT/ML IV SOLN
500.0000 [IU] | Freq: Once | INTRAVENOUS | Status: AC
  Administered 2013-10-07: 500 [IU] via INTRAVENOUS
  Filled 2013-10-07: qty 5

## 2013-10-07 MED ORDER — SODIUM CHLORIDE 0.9 % IJ SOLN
10.0000 mL | INTRAMUSCULAR | Status: DC | PRN
  Administered 2013-10-07: 10 mL via INTRAVENOUS
  Filled 2013-10-07: qty 10

## 2013-10-07 MED ORDER — SODIUM CHLORIDE 0.9 % IV SOLN
1000.0000 mL | INTRAVENOUS | Status: DC
  Administered 2013-10-07: 16:00:00 via INTRAVENOUS

## 2013-10-07 NOTE — Progress Notes (Signed)
1610 pt brought to infusion room in wheelchair by nurse. NS infusing into RUA duel lumen PICC. Chrystie Nose RN stated she got blood return from both ports. 1630 pt escorted to bathroom ambulatory, steady on feet.

## 2013-10-07 NOTE — Progress Notes (Signed)
At 1525hr, found patient sitting near exam room, feeling "my head feels like swimming".  Almira Coaster Dixion RN, Gardiner Fanti LPN and myself assisted pt. onto exam room table.  Pt. was on her way to chemo after being seen by Lonna Cobb NP.  Orthostatic pressure taken BP 96/66 sitting and 92/63 lying.  Pt. stated that she was told she is a "borderline diabetic" by her PCP. Pt. has not been eating much at home for several days at home. Pt. was taking tramadol and was sleeping over the weekend.  Pt. did not eat anything today but did drink gatorade and coke.    Positive blood return noted to both PICC lumen and 1 liter of fluids ordered by Lonna Cobb NP.  [phone], pt. Reassessed and voiced improvement and talkative. HL Per Lonna Cobb NP and Dr. Truett Perna, hold chemo and give IVF today.  Rescheduled chemo to tomorrow Nov. 18, 2014. POF entered and Melissa D notified. HL

## 2013-10-07 NOTE — Telephone Encounter (Signed)
not able to reach pt on home or cell or lm re add on appts for 11/26 and 12/5 - vm not set up. per added inf for 8am  11/18 per Myriam Jacobson and per [facility] pt aware. added comments to 11/18 appt to send pt for new schedule.

## 2013-10-07 NOTE — Progress Notes (Addendum)
OFFICE PROGRESS NOTE  Interval history:  Erin [name] returns for followup prior to proceeding with cycle 2 5-FU/mitomycin C. for treatment of anal cancer.  She denies nausea/vomiting. No mouth sores. She notes loose stools after eating. She denies rectal bleeding. The rash over her face and neck has resolved. She now has a rash over the back and also has a few scalp lesions. The rash is pruritic.  Percocet was discontinued last week due to concern the rash was related to an allergic reaction. She tried Ultram over the weekend. She noted excessive sedation with the Ultram.  She reports not eating or drinking well over the weekend due to sedation. She had an episode of dizziness/lightheadedness this morning. She drank a "quart" of Gatorade and symptoms resolved.  The initial plan was to proceed with cycle 2 5-FU/mitomycin-C today as scheduled. However, upon leaving the exam room she became lightheaded and dizzy again. She was hypotensive with a pressure of 89/61. The PICC line was accessed and IV fluids were initiated. She noted improvement in the lightheadedness and dizziness upon initiation of IV fluids.   Objective: Blood pressure 99/59, pulse 85, temperature 98.2 F (36.8 C), temperature source Oral, resp. rate 18, height 5\' 3"  (1.6 m), weight 108 lb 14.4 oz (49.397 kg).  Oropharynx is without thrush or ulcerations. Mucous membranes moist. Lungs clear. Regular cardiac rhythm. Abdomen soft. Mild tenderness at the left lower quadrant. No hepatomegaly. Bilateral groin regions, labia and perineum erythematous with superficial ulceration. No leg edema. Morbilliform rash over the upper back right side greater than left. Excoriated in some areas. Lower back with scarring that appears consistent with use of a heating pad. Alert and oriented. Speech fluent. Moves all extremities. Motor strength 5 over 5. Knee DTRs 2+, symmetric. Extraocular movements intact. Face symmetric. Gait normal. Right upper  extremity PICC site nontender and without erythema.   Lab Results: Lab Results  Component Value Date   WBC 6.7 10/07/2013   HGB 10.9* 10/07/2013   HCT 32.8* 10/07/2013   MCV 92.1 10/07/2013   PLT 237 10/07/2013    Chemistry:    Chemistry      Component Value Date/Time   NA 136 10/07/2013 1320   NA 142 07/30/2013 0903   K 3.6 10/07/2013 1320   K 4.8 07/30/2013 0903   CL 111 07/30/2013 0903   CO2 23 10/07/2013 1320   CO2 25 07/30/2013 0903   BUN 10.0 10/07/2013 1320   BUN 18 07/30/2013 0903   CREATININE 0.7 10/07/2013 1320   CREATININE 0.8 07/30/2013 0903      Component Value Date/Time   CALCIUM 8.6 10/07/2013 1320   CALCIUM 8.6 07/30/2013 0903   ALKPHOS 90 10/07/2013 1320   ALKPHOS 50 08/05/2013 1247   AST 19 10/07/2013 1320   AST 16 08/05/2013 1247   ALT 8 10/07/2013 1320   ALT 8 08/05/2013 1247   BILITOT 0.24 10/07/2013 1320   BILITOT 0.5 08/05/2013 1247       Studies/Results: No results found.  Medications: I have reviewed the patient's current medications.  Assessment/Plan:  1. Squamous cell carcinoma of the anus, clinical stage II (T2 N0), status post endoscopic biopsy 08/14/2013 confirming invasive squamous cell carcinoma. Initiation of radiation and cycle 1 5-FU/mitomycin C. 09/09/2013. 2. Rectal pain, constipation and bleeding secondary to #1. Improved. 3. Iron deficiency anemia. 4. Vitamin B12 deficiency. 5. Diabetes. 6. Hyperlipidemia. 7. Neurodermatitis. 8. Tobacco use. She reports she has quit smoking. 9. Mucositis following cycle 1 5-FU/mitomycin C.  10.  Skin rash. Initially felt to be related to the chemotherapy though somewhat atypical in appearance and distribution. Question if secondary to Percocet. 11. Episode of lightheadedness/dizziness in the office in the setting of hypotension. Question dehydration.  Disposition-we elected to give her a liter of IV fluids in the office and delay cycle 2 5-FU/mitomycin C. until 10/08/2013. She was reevaluated during  the IV fluids. Symptomatically she was feeling much better. She was able to ambulate to the bathroom independently with no recurrence of the lightheadedness/dizziness. Blood pressure was improved with a systolic of around 140. She will push fluids by mouth as tolerated overnight. She is scheduled to return tomorrow morning to proceed with the chemotherapy. She understands to contact the on-call physician tonight with further problems.  She was given a prescription for Dilaudid 2 mg every 4 hours as needed for pain at the rectum.  Patient seen with Dr. Truett Perna.  Approximately 40 minutes were spent face-to-face with the patient at today's visit. The majority of that time was involved in counseling/coordination of care.  Lonna Cobb ANP/GNP-BC   This was a shared visit with Lonna Cobb. She is scheduled to begin a second cycle of 5-fluorouracil/mitomycin-C. The first cycle was complicated by mucositis. The chemotherapy will be dose reduced with cycle 2. The neck rash has resolved. She now has a rash at the right greater than left back that has the appearance of a "drug rash ". I doubt the rash is related to 5-fluorouracil or mitomycin-C.  Chemotherapy was held today secondary to clinical evidence of dehydration. She was given intravenous fluids and will return for scheduled chemotherapy on 10/08/2013.  Mancel Bale, M.D.

## 2013-10-07 NOTE — Patient Instructions (Signed)
Dehydration, Adult  Dehydration means your body does not have as much fluid as it needs. Your kidneys, brain, and heart will not work properly without the right amount of fluids and salt.   HOME CARE   Ask your doctor how to replace body fluid losses (rehydrate).   Drink enough fluids to keep your pee (urine) clear or pale yellow.   Drink small amounts of fluids often if you feel sick to your stomach (nauseous) or throw up (vomit).   Eat like you normally do.   Avoid:   Foods or drinks high in sugar.   Bubbly (carbonated) drinks.   Juice.   Very hot or cold fluids.   Drinks with caffeine.   Fatty, greasy foods.   Alcohol.   Tobacco.   Eating too much.   Gelatin desserts.   Wash your hands to avoid spreading germs (bacteria, viruses).   Only take medicine as told by your doctor.   Keep all doctor visits as told.  GET HELP RIGHT AWAY IF:    You cannot drink something without throwing up.   You get worse even with treatment.   Your vomit has blood in it or looks greenish.   Your poop (stool) has blood in it or looks black and tarry.   You have not peed in 6 to 8 hours.   You pee a small amount of very dark pee.   You have a fever.   You pass out (faint).   You have belly (abdominal) pain that gets worse or stays in one spot (localizes).   You have a rash, stiff neck, or bad headache.   You get easily annoyed, sleepy, or are hard to wake up.   You feel weak, dizzy, or very thirsty.  MAKE SURE YOU:    Understand these instructions.   Will watch your condition.   Will get help right away if you are not doing well or get worse.  Document Released: 09/03/2009 Document Revised: 01/30/2012 Document Reviewed: 06/27/2011  [employer] Patient Information 2014 [employer].

## 2013-10-07 NOTE — Progress Notes (Signed)
Patient's chemotherapy was canceled.  She is going to receive fluids.  Weight has declined and was documented as 108.9 pounds November 17.  Patient reports she drank Gatorade today but has not had much to eat.  Nutrition diagnosis: Food and nutrition related knowledge deficit continues.  Nutrition diagnosis: Unintended weight loss related to inadequate oral intake as evidenced by 8% weight loss over 6 weeks.  Intervention: Patient provided with support and encouragement however, nutrition followup deferred today because patient does not feel well.  I will continue to monitor and work with patient as needed.    Monitoring, evaluation, goals: Patient will tolerate adequate calories and protein to minimize further weight loss.  Next visit: I will follow patient throughout treatment as needed.

## 2013-10-08 ENCOUNTER — Telehealth: Payer: Self-pay | Admitting: Oncology

## 2013-10-08 ENCOUNTER — Other Ambulatory Visit: Payer: No Typology Code available for payment source | Admitting: Lab

## 2013-10-08 ENCOUNTER — Ambulatory Visit
Admission: RE | Admit: 2013-10-08 | Discharge: 2013-10-08 | Disposition: A | Discharge status: Home / Self Care | Payer: No Typology Code available for payment source | Source: Ambulatory Visit | Attending: Radiation Oncology | Admitting: Radiation Oncology

## 2013-10-08 ENCOUNTER — Ambulatory Visit (HOSPITAL_BASED_OUTPATIENT_CLINIC_OR_DEPARTMENT_OTHER): Payer: No Typology Code available for payment source

## 2013-10-08 VITALS — BP 118/64 | HR 78 | Temp 98.4°F

## 2013-10-08 DIAGNOSIS — Z5111 Encounter for antineoplastic chemotherapy: Secondary | ICD-10-CM

## 2013-10-08 DIAGNOSIS — C210 Malignant neoplasm of anus, unspecified: Secondary | ICD-10-CM

## 2013-10-08 MED ORDER — ONDANSETRON 8 MG/NS 50 ML IVPB
INTRAVENOUS | Status: AC
  Filled 2013-10-08: qty 8

## 2013-10-08 MED ORDER — MITOMYCIN CHEMO IV INJECTION 20 MG
7.0000 mg/m2 | Freq: Once | INTRAVENOUS | Status: AC
  Administered 2013-10-08: 10 mg via INTRAVENOUS
  Filled 2013-10-08: qty 20

## 2013-10-08 MED ORDER — ONDANSETRON 8 MG/50ML IVPB (CHCC)
8.0000 mg | Freq: Once | INTRAVENOUS | Status: AC
  Administered 2013-10-08: 8 mg via INTRAVENOUS

## 2013-10-08 MED ORDER — SODIUM CHLORIDE 0.9 % IV SOLN
700.0000 mg/m2/d | INTRAVENOUS | Status: DC
  Administered 2013-10-08: 4300 mg via INTRAVENOUS
  Filled 2013-10-08: qty 86

## 2013-10-08 MED ORDER — SODIUM CHLORIDE 0.9 % IV SOLN
Freq: Once | INTRAVENOUS | Status: AC
  Administered 2013-10-08: 08:00:00 via INTRAVENOUS

## 2013-10-08 NOTE — Patient Instructions (Signed)
[facility] Discharge Instructions for Patients Receiving Chemotherapy  Today you received the following chemotherapy agents mutamycin, 5FU  To help prevent nausea and vomiting after your treatment, we encourage you to take your nausea medication as needed   If you develop nausea and vomiting that is not controlled by your nausea medication, call the clinic.   BELOW ARE SYMPTOMS THAT SHOULD BE REPORTED IMMEDIATELY:  *FEVER GREATER THAN 100.5 F  *CHILLS WITH OR WITHOUT FEVER  NAUSEA AND VOMITING THAT IS NOT CONTROLLED WITH YOUR NAUSEA MEDICATION  *UNUSUAL SHORTNESS OF BREATH  *UNUSUAL BRUISING OR BLEEDING  TENDERNESS IN MOUTH AND THROAT WITH OR WITHOUT PRESENCE OF ULCERS  *URINARY PROBLEMS  *BOWEL PROBLEMS  UNUSUAL RASH Items with * indicate a potential emergency and should be followed up as soon as possible.  Feel free to call the clinic you have any questions or concerns. The clinic phone number is [phone].

## 2013-10-08 NOTE — Progress Notes (Signed)
Pt states she is feeling much better than yesterday and feels well enough to get treatment today.  No complaints.

## 2013-10-08 NOTE — Telephone Encounter (Signed)
MD added appts.Marland KitchenMarland KitchenI printed appt sched and avs for pt

## 2013-10-09 ENCOUNTER — Ambulatory Visit (HOSPITAL_BASED_OUTPATIENT_CLINIC_OR_DEPARTMENT_OTHER): Payer: No Typology Code available for payment source

## 2013-10-09 ENCOUNTER — Ambulatory Visit
Admission: RE | Admit: 2013-10-09 | Discharge: 2013-10-09 | Disposition: A | Discharge status: Home / Self Care | Payer: No Typology Code available for payment source | Source: Ambulatory Visit | Attending: Radiation Oncology | Admitting: Radiation Oncology

## 2013-10-09 DIAGNOSIS — Z452 Encounter for adjustment and management of vascular access device: Secondary | ICD-10-CM

## 2013-10-09 DIAGNOSIS — C210 Malignant neoplasm of anus, unspecified: Secondary | ICD-10-CM

## 2013-10-09 MED ORDER — SODIUM CHLORIDE 0.9 % IJ SOLN
10.0000 mL | INTRAMUSCULAR | Status: DC | PRN
  Administered 2013-10-09: 10 mL via INTRAVENOUS
  Filled 2013-10-09: qty 10

## 2013-10-09 MED ORDER — HEPARIN SOD (PORK) LOCK FLUSH 100 UNIT/ML IV SOLN
500.0000 [IU] | Freq: Once | INTRAVENOUS | Status: AC
  Administered 2013-10-09: 500 [IU] via INTRAVENOUS
  Filled 2013-10-09: qty 5

## 2013-10-10 ENCOUNTER — Ambulatory Visit
Admission: RE | Admit: 2013-10-10 | Discharge: 2013-10-10 | Disposition: A | Discharge status: Home / Self Care | Payer: No Typology Code available for payment source | Source: Ambulatory Visit | Attending: Radiation Oncology | Admitting: Radiation Oncology

## 2013-10-11 ENCOUNTER — Ambulatory Visit
Admission: RE | Admit: 2013-10-11 | Discharge: 2013-10-11 | Disposition: A | Discharge status: Home / Self Care | Payer: No Typology Code available for payment source | Source: Ambulatory Visit | Attending: Radiation Oncology | Admitting: Radiation Oncology

## 2013-10-11 ENCOUNTER — Ambulatory Visit: Payer: No Typology Code available for payment source

## 2013-10-11 ENCOUNTER — Ambulatory Visit (HOSPITAL_BASED_OUTPATIENT_CLINIC_OR_DEPARTMENT_OTHER): Payer: No Typology Code available for payment source

## 2013-10-11 VITALS — BP 105/62 | HR 105 | Temp 98.5°F | Resp 19

## 2013-10-11 VITALS — BP 117/74 | HR 94 | Temp 98.6°F | Ht 63.0 in | Wt 110.7 lb

## 2013-10-11 DIAGNOSIS — Z452 Encounter for adjustment and management of vascular access device: Secondary | ICD-10-CM

## 2013-10-11 DIAGNOSIS — C210 Malignant neoplasm of anus, unspecified: Secondary | ICD-10-CM

## 2013-10-11 MED ORDER — LIDOCAINE (ANORECTAL) 5 % EX CREA: 1.0000 "application " | TOPICAL_CREAM | CUTANEOUS | Status: DC | PRN

## 2013-10-11 MED ORDER — SODIUM CHLORIDE 0.9 % IJ SOLN
3.0000 mL | Freq: Once | INTRAMUSCULAR | Status: AC
  Administered 2013-10-11: 3 mL via INTRAVENOUS
  Filled 2013-10-11: qty 10

## 2013-10-11 MED ORDER — HEPARIN SOD (PORK) LOCK FLUSH 100 UNIT/ML IV SOLN
250.0000 [IU] | Freq: Once | INTRAVENOUS | Status: DC | PRN
  Filled 2013-10-11: qty 5

## 2013-10-11 MED ORDER — SODIUM CHLORIDE 0.9 % IJ SOLN
3.0000 mL | Freq: Once | INTRAMUSCULAR | Status: DC
  Filled 2013-10-11: qty 10

## 2013-10-11 MED ORDER — SODIUM CHLORIDE 0.9 % IJ SOLN
3.0000 mL | INTRAMUSCULAR | Status: DC | PRN
  Filled 2013-10-11: qty 10

## 2013-10-11 MED ORDER — HEPARIN SOD (PORK) LOCK FLUSH 100 UNIT/ML IV SOLN
250.0000 [IU] | Freq: Once | INTRAVENOUS | Status: AC
  Administered 2013-10-11: 250 [IU] via INTRAVENOUS
  Filled 2013-10-11: qty 5

## 2013-10-11 MED ORDER — HEPARIN SOD (PORK) LOCK FLUSH 100 UNIT/ML IV SOLN
250.0000 [IU] | Freq: Once | INTRAVENOUS | Status: DC
  Filled 2013-10-11: qty 5

## 2013-10-11 NOTE — Progress Notes (Signed)
Erin [name] here for weekly under treat visit.  She has had 25 fractions to her anal area/pelvis.  She is having a headache that she is rating at a 4/10.  She is wondering if she can take ibuprofen.  She reports that all other pain medications make her itch.  She has occasional nausea in the morning.  She needs a refill on the lidocaine ointment.  She does have chemotherapy infusing through her right picc line today.  She denies having diarrhea.  She reports that her skin in the treatment area is blistered.  She continues to have a rash on her upper back, scalp and upper arms.  She says it is itchy and she is using hydrocortisone cream.

## 2013-10-11 NOTE — Patient Instructions (Signed)

## 2013-10-11 NOTE — Progress Notes (Signed)
Department of Radiation Oncology  Phone:  [phone] Fax:        [phone]  Weekly Treatment Note    Name: Erin [name] Date: 10/11/2013 MRN: [id number] DOB: [birth year]   Current dose: 45 Gy  Current fraction: 25   MEDICATIONS: Current Outpatient Prescriptions  Medication Sig Dispense Refill  . bisacodyl (DULCOLAX) 5 MG EC tablet Take 15 mg by mouth daily as needed for constipation.      . cimetidine (TAGAMET) 200 MG tablet Take 200 mg by mouth as needed.      . docusate sodium (COLACE) 100 MG capsule Take 100 mg by mouth 2 (two) times daily.      . fluticasone (FLONASE) 50 MCG/ACT nasal spray Place 2 sprays into the nose daily.  16 g  6  . gabapentin (NEURONTIN) 600 MG tablet TAKE 1 TABLET THREE TIMES A DAY  180 tablet  1  . Lidocaine, Anorectal, 5 % CREA Apply 1 application topically as needed. Apply to affected area prn, is an ointment      . ondansetron (ZOFRAN) 8 MG tablet Take 1 tablet (8 mg total) by mouth every 12 (twelve) hours as needed for nausea.  40 tablet  0  . Probiotic Product (PROBIOTIC DAILY PO) Take by mouth daily.       . prochlorperazine (COMPAZINE) 10 MG tablet Take 1 tablet (10 mg total) by mouth every 6 (six) hours as needed (nausea).  60 tablet  1  . traMADol (ULTRAM) 50 MG tablet Take 1 tablet (50 mg total) by mouth every 6 (six) hours as needed (1-2 every 6 hours as needed. Maximum 8 tablets per day.).  90 tablet  0  . vitamin B-12 (CYANOCOBALAMIN) 1000 MCG tablet Place 1,000 mcg under the tongue daily.      . Aspirin-Salicylamide-Caffeine (BC HEADACHE POWDER PO) Take by mouth as needed.      . ferrous sulfate 325 (65 FE) MG tablet Take 325 mg by mouth 2 (two) times daily.      . fluconazole (DIFLUCAN) 150 MG tablet Take 1 tablet (150 mg total) by mouth once.  2 tablet  0  . HYDROmorphone (DILAUDID) 2 MG tablet Take 1 tablet (2 mg total) by mouth every 4 (four) hours as needed for severe pain.  30 tablet  0  . nystatin (MYCOSTATIN) 100000  UNIT/ML suspension Take 5 mLs (500,000 Units total) by mouth 4 (four) times daily. Swish and swallow  120 mL  0  . OVER THE COUNTER MEDICATION Apply 1 application topically 3 (three) times daily as needed ([facility]).       . varenicline (CHANTIX CONTINUING MONTH PAK) 1 MG tablet Take 1 tablet (1 mg total) by mouth 2 (two) times daily.  60 tablet  1  . varenicline (CHANTIX STARTING MONTH PAK) 0.5 MG X 11 & 1 MG X 42 tablet Take one 0.5 mg tablet by mouth once daily for 3 days, then increase to one 0.5 mg tablet twice daily for 4 days, then increase to one 1 mg tablet twice daily.  53 tablet  0  . vitamin E 1000 UNIT capsule Take 1,000 Units by mouth daily.       No current facility-administered medications for this encounter.     ALLERGIES: Dexamethasone; Claritin; Sulfa antibiotics; and Prednisone   LABORATORY DATA:  Lab Results  Component Value Date   WBC 6.7 10/07/2013   HGB 10.9* 10/07/2013   HCT 32.8* 10/07/2013   MCV 92.1 10/07/2013   PLT 237  10/07/2013   Lab Results  Component Value Date   NA 136 10/07/2013   K 3.6 10/07/2013   CL 111 07/30/2013   CO2 23 10/07/2013   Lab Results  Component Value Date   ALT 8 10/07/2013   AST 19 10/07/2013   ALKPHOS 90 10/07/2013   BILITOT 0.24 10/07/2013     NARRATIVE: Erin [name] was seen today for weekly treatment management. The chart was checked and the patient's films were reviewed. The patient states that she is doing quite well overall. Some irritation in the anal region with moderate pain. She does need a refill on lidocaine ointment. The patient has an occasional headache and we discussed that she can take medication for this.  PHYSICAL EXAMINATION: height is 5\' 3"  (1.6 m) and weight is 110 lb 11.2 oz (50.213 kg). Her temperature is 98.6 F (37 C). Her blood pressure is 117/74 and her pulse is 94.        ASSESSMENT: The patient is doing satisfactorily with treatment.  PLAN: We will continue with the patient's radiation  treatment as planned. The patient has 3 more fractions remaining.

## 2013-10-12 ENCOUNTER — Ambulatory Visit (HOSPITAL_BASED_OUTPATIENT_CLINIC_OR_DEPARTMENT_OTHER): Payer: No Typology Code available for payment source

## 2013-10-12 VITALS — BP 109/77 | HR 103 | Temp 99.2°F | Resp 18

## 2013-10-12 DIAGNOSIS — Z452 Encounter for adjustment and management of vascular access device: Secondary | ICD-10-CM

## 2013-10-12 DIAGNOSIS — C210 Malignant neoplasm of anus, unspecified: Secondary | ICD-10-CM

## 2013-10-12 MED ORDER — HEPARIN SOD (PORK) LOCK FLUSH 100 UNIT/ML IV SOLN
250.0000 [IU] | Freq: Once | INTRAVENOUS | Status: AC
  Administered 2013-10-12: 250 [IU] via INTRAVENOUS
  Filled 2013-10-12: qty 5

## 2013-10-12 MED ORDER — SODIUM CHLORIDE 0.9 % IJ SOLN
10.0000 mL | Freq: Once | INTRAMUSCULAR | Status: AC
  Administered 2013-10-12: 10 mL via INTRAVENOUS
  Filled 2013-10-12: qty 10

## 2013-10-12 MED ORDER — SODIUM CHLORIDE 0.9 % IJ SOLN
10.0000 mL | Freq: Once | INTRAMUSCULAR | Status: AC
  Administered 2013-10-12: 10 mL
  Filled 2013-10-12: qty 10

## 2013-10-14 ENCOUNTER — Encounter: Payer: Self-pay | Admitting: Radiation Oncology

## 2013-10-14 ENCOUNTER — Ambulatory Visit
Admission: RE | Admit: 2013-10-14 | Discharge: 2013-10-14 | Disposition: A | Discharge status: Home / Self Care | Payer: No Typology Code available for payment source | Source: Ambulatory Visit | Attending: Radiation Oncology | Admitting: Radiation Oncology

## 2013-10-14 ENCOUNTER — Ambulatory Visit (HOSPITAL_BASED_OUTPATIENT_CLINIC_OR_DEPARTMENT_OTHER): Payer: No Typology Code available for payment source

## 2013-10-14 ENCOUNTER — Ambulatory Visit: Payer: No Typology Code available for payment source

## 2013-10-14 VITALS — BP 86/66 | HR 150 | Temp 98.1°F | Resp 22 | Wt 105.4 lb

## 2013-10-14 VITALS — BP 107/57

## 2013-10-14 DIAGNOSIS — C210 Malignant neoplasm of anus, unspecified: Secondary | ICD-10-CM

## 2013-10-14 DIAGNOSIS — E860 Dehydration: Secondary | ICD-10-CM

## 2013-10-14 MED ORDER — SODIUM CHLORIDE 0.9 % IV SOLN
Freq: Once | INTRAVENOUS | Status: DC
  Filled 2013-10-14: qty 1000

## 2013-10-14 MED ORDER — DIPHENHYDRAMINE HCL 25 MG PO CAPS
25.0000 mg | ORAL_CAPSULE | Freq: Once | ORAL | Status: AC
  Administered 2013-10-14: 25 mg via ORAL

## 2013-10-14 MED ORDER — DIPHENHYDRAMINE HCL 25 MG PO CAPS
ORAL_CAPSULE | ORAL | Status: AC
  Filled 2013-10-14: qty 1

## 2013-10-14 MED ORDER — HEPARIN SOD (PORK) LOCK FLUSH 100 UNIT/ML IV SOLN
250.0000 [IU] | Freq: Once | INTRAVENOUS | Status: AC
  Administered 2013-10-14: 250 [IU] via INTRAVENOUS
  Filled 2013-10-14: qty 5

## 2013-10-14 MED ORDER — SODIUM CHLORIDE 0.9 % IV SOLN
Freq: Once | INTRAVENOUS | Status: AC
  Administered 2013-10-14: 12:00:00 via INTRAVENOUS

## 2013-10-14 MED ORDER — SODIUM CHLORIDE 0.9 % IJ SOLN
10.0000 mL | Freq: Once | INTRAMUSCULAR | Status: AC
  Administered 2013-10-14: 10 mL
  Filled 2013-10-14: qty 10

## 2013-10-14 MED ORDER — SILVER SULFADIAZINE 1 % EX CREA
TOPICAL_CREAM | Freq: Two times a day (BID) | CUTANEOUS | Status: DC
  Administered 2013-10-14: 10:00:00 via TOPICAL

## 2013-10-14 MED ORDER — SODIUM CHLORIDE 0.9 % IJ SOLN
10.0000 mL | Freq: Once | INTRAMUSCULAR | Status: AC
  Administered 2013-10-14: 10 mL via INTRAVENOUS
  Filled 2013-10-14: qty 10

## 2013-10-14 NOTE — Progress Notes (Signed)
Department of Radiation Oncology  Phone:  [phone] Fax:        [phone]  Weekly Treatment Note    Name: Erin [name] Date: 10/14/2013 MRN: [id number] DOB: February 14, 1956   Current dose: 46.8 Gy  Current fraction:26   MEDICATIONS: Current Outpatient Prescriptions  Medication Sig Dispense Refill  . Aspirin-Salicylamide-Caffeine (BC HEADACHE POWDER PO) Take by mouth as needed.      . bisacodyl (DULCOLAX) 5 MG EC tablet Take 15 mg by mouth daily as needed for constipation.      . cimetidine (TAGAMET) 200 MG tablet Take 200 mg by mouth as needed.      . docusate sodium (COLACE) 100 MG capsule Take 100 mg by mouth 2 (two) times daily.      . ferrous sulfate 325 (65 FE) MG tablet Take 325 mg by mouth 2 (two) times daily.      . fluconazole (DIFLUCAN) 150 MG tablet Take 1 tablet (150 mg total) by mouth once.  2 tablet  0  . fluticasone (FLONASE) 50 MCG/ACT nasal spray Place 2 sprays into the nose daily.  16 g  6  . gabapentin (NEURONTIN) 600 MG tablet TAKE 1 TABLET THREE TIMES A DAY  180 tablet  1  . HYDROmorphone (DILAUDID) 2 MG tablet Take 1 tablet (2 mg total) by mouth every 4 (four) hours as needed for severe pain.  30 tablet  0  . Lidocaine, Anorectal, 5 % CREA Apply 1 application topically as needed. Apply to affected area prn, is an ointment  30 g  0  . nystatin (MYCOSTATIN) 100000 UNIT/ML suspension Take 5 mLs (500,000 Units total) by mouth 4 (four) times daily. Swish and swallow  120 mL  0  . ondansetron (ZOFRAN) 8 MG tablet Take 1 tablet (8 mg total) by mouth every 12 (twelve) hours as needed for nausea.  40 tablet  0  . OVER THE COUNTER MEDICATION Apply 1 application topically 3 (three) times daily as needed ([facility]).       . Probiotic Product (PROBIOTIC DAILY PO) Take by mouth daily.       . prochlorperazine (COMPAZINE) 10 MG tablet Take 1 tablet (10 mg total) by mouth every 6 (six) hours as needed (nausea).  60 tablet  1  . traMADol (ULTRAM) 50 MG tablet Take 1  tablet (50 mg total) by mouth every 6 (six) hours as needed (1-2 every 6 hours as needed. Maximum 8 tablets per day.).  90 tablet  0  . varenicline (CHANTIX CONTINUING MONTH PAK) 1 MG tablet Take 1 tablet (1 mg total) by mouth 2 (two) times daily.  60 tablet  1  . varenicline (CHANTIX STARTING MONTH PAK) 0.5 MG X 11 & 1 MG X 42 tablet Take one 0.5 mg tablet by mouth once daily for 3 days, then increase to one 0.5 mg tablet twice daily for 4 days, then increase to one 1 mg tablet twice daily.  53 tablet  0  . vitamin B-12 (CYANOCOBALAMIN) 1000 MCG tablet Place 1,000 mcg under the tongue daily.      . vitamin E 1000 UNIT capsule Take 1,000 Units by mouth daily.       Current Facility-Administered Medications  Medication Dose Route Frequency Provider Last Rate Last Dose  . 0.9 %  sodium chloride infusion   Intravenous Once Jonna Coup, MD      . silver sulfADIAZINE (SILVADENE) 1 % cream   Topical BID Jonna Coup, MD  ALLERGIES: Dexamethasone; Claritin; Sulfa antibiotics; and Prednisone   LABORATORY DATA:  Lab Results  Component Value Date   WBC 6.7 10/07/2013   HGB 10.9* 10/07/2013   HCT 32.8* 10/07/2013   MCV 92.1 10/07/2013   PLT 237 10/07/2013   Lab Results  Component Value Date   NA 136 10/07/2013   K 3.6 10/07/2013   CL 111 07/30/2013   CO2 23 10/07/2013   Lab Results  Component Value Date   ALT 8 10/07/2013   AST 19 10/07/2013   ALKPHOS 90 10/07/2013   BILITOT 0.24 10/07/2013     NARRATIVE: Erin [name] was seen today for weekly treatment management. The chart was checked and the patient's films were reviewed. The patient has been having significant nausea over the last several days. She does have Zofran which he feels works okay. She really has only been taking this once a day. She has Compazine available and has not taken this at all.  PHYSICAL EXAMINATION: weight is 105 lb 6.4 oz (47.809 kg). Her oral temperature is 98.1 F (36.7 C). Her blood  pressure is 86/66 and her pulse is 150. Her respiration is 22.      significant moist desquamation in the anal region with hyperpigmentation/radiation changes elsewhere, especially in the inguinal region.  ASSESSMENT: The patient is doing satisfactorily with treatment.  PLAN: We will continue with the patient's radiation treatment as planned. The patient has 2 more days of treatment. I discussed with her increasing her use of nausea medicine, including using Compazine as well. She will be given IV fluids today because of orthostatics.

## 2013-10-14 NOTE — Addendum Note (Signed)
Encounter addended by: Santina Evans, RPH on: 10/14/2013 12:15 PM<BR>     Documentation filed: Rx Order Verification

## 2013-10-14 NOTE — Progress Notes (Addendum)
Weekly rad txs 26 completed anal, patient very weak, threw up this am after taking nystatin liquid, drinking gator ade only, fell in shower this am,  siad didn't hurt herself, has rash on back and left side, last chemo Saturday am, very week, had IVF's last Monday, nauseated and took med this am has helped with that, not taking tramadol ,makes her sleep too long, bottom is "burnt stated,but sitz baths are helping, uses lidoacine 5%"  9:44 AM

## 2013-10-14 NOTE — Progress Notes (Signed)
Add on for IVF's - will be accessed and lab draw in treatment room.

## 2013-10-14 NOTE — Addendum Note (Signed)
Encounter addended by: Santina Evans, RPH on: 10/14/2013 11:59 AM<BR>     Documentation filed: Rx Order Verification

## 2013-10-14 NOTE — Patient Instructions (Signed)
Dehydration, Adult Dehydration is when you lose more fluids from the body than you take in. Vital organs like the kidneys, brain, and heart cannot function without a proper amount of fluids and salt. Any loss of fluids from the body can cause dehydration.  CAUSES   Vomiting.  Diarrhea.  Excessive sweating.  Excessive urine output.  Fever. SYMPTOMS  Mild dehydration  Thirst.  Dry lips.  Slightly dry mouth. Moderate dehydration  Very dry mouth.  Sunken eyes.  Skin does not bounce back quickly when lightly pinched and released.  Dark urine and decreased urine production.  Decreased tear production.  Headache. Severe dehydration  Very dry mouth.  Extreme thirst.  Rapid, weak pulse (more than 100 beats per minute at rest).  Cold hands and feet.  Not able to sweat in spite of heat and temperature.  Rapid breathing.  Blue lips.  Confusion and lethargy.  Difficulty being awakened.  Minimal urine production.  No tears. DIAGNOSIS  Your caregiver will diagnose dehydration based on your symptoms and your exam. Blood and urine tests will help confirm the diagnosis. The diagnostic evaluation should also identify the cause of dehydration. TREATMENT  Treatment of mild or moderate dehydration can often be done at home by increasing the amount of fluids that you drink. It is best to drink small amounts of fluid more often. Drinking too much at one time can make vomiting worse. Refer to the home care instructions below. Severe dehydration needs to be treated at the hospital where you will probably be given intravenous (IV) fluids that contain water and electrolytes. HOME CARE INSTRUCTIONS   Ask your caregiver about specific rehydration instructions.  Drink enough fluids to keep your urine clear or pale yellow.  Drink small amounts frequently if you have nausea and vomiting.  Eat as you normally do.  Avoid:  Foods or drinks high in sugar.  Carbonated  drinks.  Juice.  Extremely hot or cold fluids.  Drinks with caffeine.  Fatty, greasy foods.  Alcohol.  Tobacco.  Overeating.  Gelatin desserts.  Wash your hands well to avoid spreading bacteria and viruses.  Only take over-the-counter or prescription medicines for pain, discomfort, or fever as directed by your caregiver.  Ask your caregiver if you should continue all prescribed and over-the-counter medicines.  Keep all follow-up appointments with your caregiver. SEEK MEDICAL CARE IF:  You have abdominal pain and it increases or stays in one area (localizes).  You have a rash, stiff neck, or severe headache.  You are irritable, sleepy, or difficult to awaken.  You are weak, dizzy, or extremely thirsty. SEEK IMMEDIATE MEDICAL CARE IF:   You are unable to keep fluids down or you get worse despite treatment.  You have frequent episodes of vomiting or diarrhea.  You have blood or green matter (bile) in your vomit.  You have blood in your stool or your stool looks black and tarry.  You have not urinated in 6 to 8 hours, or you have only urinated a small amount of very dark urine.  You have a fever.  You faint. MAKE SURE YOU:   Understand these instructions.  Will watch your condition.  Will get help right away if you are not doing well or get worse. Document Released: 11/07/2005 Document Revised: 01/30/2012 Document Reviewed: 06/27/2011 [employer] Patient Information 2014 [employer].  

## 2013-10-14 NOTE — Addendum Note (Signed)
Encounter addended by: Jonna Coup, MD on: 10/14/2013 12:14 PM<BR>     Documentation filed: Orders

## 2013-10-14 NOTE — Addendum Note (Signed)
Encounter addended by: Lowella Petties, RN on: 10/14/2013 11:57 AM<BR>     Documentation filed: Orders

## 2013-10-15 ENCOUNTER — Ambulatory Visit
Admission: RE | Admit: 2013-10-15 | Discharge: 2013-10-15 | Disposition: A | Discharge status: Home / Self Care | Payer: No Typology Code available for payment source | Source: Ambulatory Visit | Attending: Radiation Oncology | Admitting: Radiation Oncology

## 2013-10-16 ENCOUNTER — Ambulatory Visit (HOSPITAL_BASED_OUTPATIENT_CLINIC_OR_DEPARTMENT_OTHER): Payer: No Typology Code available for payment source

## 2013-10-16 ENCOUNTER — Other Ambulatory Visit (HOSPITAL_BASED_OUTPATIENT_CLINIC_OR_DEPARTMENT_OTHER): Payer: No Typology Code available for payment source | Admitting: Lab

## 2013-10-16 ENCOUNTER — Ambulatory Visit
Admission: RE | Admit: 2013-10-16 | Discharge: 2013-10-16 | Disposition: A | Discharge status: Home / Self Care | Payer: No Typology Code available for payment source | Source: Ambulatory Visit | Attending: Radiation Oncology | Admitting: Radiation Oncology

## 2013-10-16 ENCOUNTER — Encounter: Payer: Self-pay | Admitting: Radiation Oncology

## 2013-10-16 VITALS — BP 108/66 | HR 95 | Temp 98.4°F

## 2013-10-16 DIAGNOSIS — C210 Malignant neoplasm of anus, unspecified: Secondary | ICD-10-CM

## 2013-10-16 LAB — CBC WITH DIFFERENTIAL/PLATELET
EOS%: 12.9 % — ABNORMAL HIGH (ref 0.0–7.0)
Eosinophils Absolute: 0.4 10*3/uL (ref 0.0–0.5)
HGB: 9.7 g/dL — ABNORMAL LOW (ref 11.6–15.9)
MCV: 94.6 fL (ref 79.5–101.0)
MONO%: 8.7 % (ref 0.0–14.0)
NEUT#: 2.2 10*3/uL (ref 1.5–6.5)
RBC: 3.12 10*6/uL — ABNORMAL LOW (ref 3.70–5.45)
RDW: 14.8 % — ABNORMAL HIGH (ref 11.2–14.5)
WBC: 3.1 10*3/uL — ABNORMAL LOW (ref 3.9–10.3)
lymph#: 0.2 10*3/uL — ABNORMAL LOW (ref 0.9–3.3)

## 2013-10-16 MED ORDER — SODIUM CHLORIDE 0.9 % IJ SOLN
10.0000 mL | Freq: Once | INTRAMUSCULAR | Status: AC
  Administered 2013-10-16: 10 mL
  Filled 2013-10-16: qty 10

## 2013-10-16 MED ORDER — HEPARIN SOD (PORK) LOCK FLUSH 100 UNIT/ML IV SOLN
250.0000 [IU] | Freq: Once | INTRAVENOUS | Status: AC
  Administered 2013-10-16: 250 [IU] via INTRAVENOUS
  Filled 2013-10-16: qty 5

## 2013-10-16 NOTE — Patient Instructions (Signed)
Peripherally Inserted Central Catheter (PICC) Home Guide A peripherally inserted central catheter (PICC) is a long, thin, flexible tube that is inserted into a vein in the upper arm. It is a form of intravenous (IV) access. It is considered to be a "central" line because the tip of the PICC ends in a large vein in your chest. This large vein is called the superior vena cava (SVC). The PICC tip ends in the SVC because there is a lot of blood flow in the SVC. This allows medicines and IV fluids to be quickly distributed throughout the body. The PICC is inserted using a sterile technique by a specially trained nurse or physician. After the PICC is inserted, a chest X-ray is done to be sure it is in the correct place.  A PICC may be placed for different reasons, such as:  To give medicines and liquid nutrition that can only be given through a central line. Examples are:  Certain antibiotic treatments.  Chemotherapy.  Total parenteral nutrition (TPN).  To take frequent blood samples.  To give IV fluids and blood products.  If there is difficulty placing a peripheral intravenous (PIV) catheter. If taken care of properly, a PICC can remain in place for several months. A PICC can also allow patients to go home early. Medicine and PICC care can be managed at home by a family member or home healthcare team. RISKS AND COMPLICATIONS Possible problems with a PICC can occasionally occur. This may include:  A clot (thrombus) forming in or at the tip of the PICC. This can cause the PICC to become clogged. A "clot-busting" medicine called tissue plasminogen activator (tPA) can be inserted into the PICC to help break up the clot.  Inflammation of the vein (phlebitis) in which the PICC is placed. Signs of inflammation may include redness, pain at the insertion site, red streaks, or being able to feel a "cord" in the vein where the PICC is located.  Infection in the PICC or at the insertion site. Signs of  infection may include fever, chills, redness, swelling, or pus drainage from the PICC insertion site.  PICC movement (malposition). The PICC tip may migrate from its original position due to excessive physical activity, forceful coughing, sneezing, or vomiting.  A break or cut in the PICC. It is important to not use scissors near the PICC.  Nerve or tendon irritation or injury during PICC insertion. HOME CARE INSTRUCTIONS Activity  You may bend your arm and move it freely. If your PICC is near or at the bend of your elbow, avoid activity with repeated motion at the elbow.  Avoid lifting heavy objects as instructed by your caregiver.  Avoid using a crutch with the arm on the same side as your PICC. You may need to use a walker. PICC Dressing  Keep your PICC bandage (dressing) clean and dry to prevent infection.  Ask your caregiver when you may shower. Ask your caregiver to teach you how to wrap the PICC when you do take a shower.  Do not bathe, swim, or use hot tubs when you have a PICC.  Change the PICC dressing as instructed by your caregiver.  Change your PICC dressing if it becomes loose or wet. General PICC Care  Check the PICC insertion site daily for leakage, redness, swelling, or pain.  Flush the PICC as directed by your caregiver. Let your caregiver know right away if the PICC is difficult to flush or does not flush. Do not use force   to flush the PICC.  Do not use a syringe that is less than 10 mLs to flush the PICC.  Never pull or tug on the PICC.  Avoid blood pressure checks on the arm with the PICC.  Keep your PICC identification card with you at all times.  Do not take the PICC out yourself. Only a trained clinical professional should remove the PICC. SEEK IMMEDIATE MEDICAL CARE IF:  Your PICC is accidently pulled all the way out. If this happens, cover the insertion site with a bandage or gauze dressing. Do not throw the PICC away. Your caregiver will need to  inspect it.  Your PICC was tugged or pulled and has partially come out. Do not  push the PICC back in.  There is any type of drainage, redness, or swelling where the PICC enters the skin.  You cannot flush the PICC, it is difficult to flush, or the PICC leaks around the insertion site when it is flushed.  You hear a "flushing" sound when the PICC is flushed.  You have pain, discomfort, or numbness in your arm, shoulder, or jaw on the same side as the PICC .  You feel your heart "racing" or skipping beats.  You notice a hole or tear in the PICC.  You develop chills or a fever. MAKE SURE YOU:   Understand these instructions.  Will watch your condition.  Will get help right away if you are not doing well or get worse. Document Released: 05/14/2003 Document Revised: 01/30/2012 Document Reviewed: 03/14/2011 Exit[employer] Patient Information 2014 [employer].  

## 2013-10-17 ENCOUNTER — Ambulatory Visit: Payer: No Typology Code available for payment source

## 2013-10-18 ENCOUNTER — Other Ambulatory Visit (HOSPITAL_BASED_OUTPATIENT_CLINIC_OR_DEPARTMENT_OTHER): Payer: No Typology Code available for payment source | Admitting: Lab

## 2013-10-18 ENCOUNTER — Other Ambulatory Visit: Payer: Self-pay | Admitting: *Deleted

## 2013-10-18 ENCOUNTER — Ambulatory Visit: Payer: No Typology Code available for payment source

## 2013-10-18 ENCOUNTER — Ambulatory Visit (HOSPITAL_BASED_OUTPATIENT_CLINIC_OR_DEPARTMENT_OTHER): Payer: No Typology Code available for payment source

## 2013-10-18 ENCOUNTER — Ambulatory Visit: Payer: PRIVATE HEALTH INSURANCE

## 2013-10-18 ENCOUNTER — Other Ambulatory Visit: Payer: Self-pay | Admitting: Nurse Practitioner

## 2013-10-18 VITALS — BP 97/62 | HR 131 | Temp 97.6°F

## 2013-10-18 DIAGNOSIS — C210 Malignant neoplasm of anus, unspecified: Secondary | ICD-10-CM

## 2013-10-18 DIAGNOSIS — C180 Malignant neoplasm of cecum: Secondary | ICD-10-CM

## 2013-10-18 DIAGNOSIS — Z452 Encounter for adjustment and management of vascular access device: Secondary | ICD-10-CM

## 2013-10-18 DIAGNOSIS — K1370 Unspecified lesions of oral mucosa: Secondary | ICD-10-CM

## 2013-10-18 LAB — COMPREHENSIVE METABOLIC PANEL (CC13)
ALT: 12 U/L (ref 0–55)
AST: 25 U/L (ref 5–34)
Albumin: 3.3 g/dL — ABNORMAL LOW (ref 3.5–5.0)
Alkaline Phosphatase: 94 U/L (ref 40–150)
BUN: 8.9 mg/dL (ref 7.0–26.0)
Calcium: 9.2 mg/dL (ref 8.4–10.4)
Chloride: 102 mEq/L (ref 98–109)
Potassium: 3.8 mEq/L (ref 3.5–5.1)
Sodium: 138 mEq/L (ref 136–145)
Total Protein: 6.5 g/dL (ref 6.4–8.3)

## 2013-10-18 LAB — CBC WITH DIFFERENTIAL/PLATELET
Basophils Absolute: 0 10*3/uL (ref 0.0–0.1)
EOS%: 7.9 % — ABNORMAL HIGH (ref 0.0–7.0)
HGB: 10.9 g/dL — ABNORMAL LOW (ref 11.6–15.9)
MCH: 31.1 pg (ref 25.1–34.0)
NEUT#: 2.3 10*3/uL (ref 1.5–6.5)
Platelets: 202 10*3/uL (ref 145–400)
RBC: 3.51 10*6/uL — ABNORMAL LOW (ref 3.70–5.45)
RDW: 15.1 % — ABNORMAL HIGH (ref 11.2–14.5)
lymph#: 0.2 10*3/uL — ABNORMAL LOW (ref 0.9–3.3)

## 2013-10-18 MED ORDER — DIPHENHYDRAMINE HCL 25 MG PO CAPS
ORAL_CAPSULE | ORAL | Status: AC
  Filled 2013-10-18: qty 1

## 2013-10-18 MED ORDER — SODIUM CHLORIDE 0.9 % IJ SOLN
10.0000 mL | INTRAMUSCULAR | Status: DC | PRN
  Filled 2013-10-18: qty 10

## 2013-10-18 MED ORDER — HEPARIN SOD (PORK) LOCK FLUSH 100 UNIT/ML IV SOLN
500.0000 [IU] | Freq: Once | INTRAVENOUS | Status: DC
  Filled 2013-10-18: qty 5

## 2013-10-18 MED ORDER — DIPHENHYDRAMINE HCL 25 MG PO CAPS
25.0000 mg | ORAL_CAPSULE | Freq: Once | ORAL | Status: AC
  Administered 2013-10-18: 25 mg via ORAL

## 2013-10-18 MED ORDER — SODIUM CHLORIDE 0.9 % IV SOLN
1000.0000 mL | INTRAVENOUS | Status: DC
  Administered 2013-10-18: 1000 mL via INTRAVENOUS

## 2013-10-18 MED ORDER — SODIUM CHLORIDE 0.9 % IV SOLN: Freq: Once | INTRAVENOUS | Status: DC

## 2013-10-18 NOTE — Progress Notes (Signed)
Orthostatic vitals obtained post 500cc NS. With standing- pt becomes very unsteady - assistance provided by RN and tech.  Pt c/o of mild itching stating " I itch from my pain pill and at home I take a benadryl ". Pt is requesting a benadryl at this time.  Above reported to [facility] NP with verbal order given for benadryl.  Pt is scheduled tomorrow for IVF as well.

## 2013-10-18 NOTE — Progress Notes (Signed)
Patient here for PICC flush but stating that she needs IVF's today. Patient states that she has mouth sores and has only eaten a couples of bites in past 2 days. Patient has drank 2 20 ounce bottles of gatorade and is drinking gatorade at present.  Spoke with Lonna Cobb, NP and patient to receive IVF's today and tomorrow along with lab work.

## 2013-10-19 ENCOUNTER — Ambulatory Visit (HOSPITAL_BASED_OUTPATIENT_CLINIC_OR_DEPARTMENT_OTHER): Payer: No Typology Code available for payment source

## 2013-10-19 VITALS — BP 124/61 | HR 83 | Temp 98.6°F

## 2013-10-19 DIAGNOSIS — E860 Dehydration: Secondary | ICD-10-CM

## 2013-10-19 DIAGNOSIS — C210 Malignant neoplasm of anus, unspecified: Secondary | ICD-10-CM

## 2013-10-19 MED ORDER — SODIUM CHLORIDE 0.9 % IV SOLN
INTRAVENOUS | Status: DC
  Administered 2013-10-19: 10:00:00 via INTRAVENOUS

## 2013-10-21 ENCOUNTER — Ambulatory Visit: Payer: No Typology Code available for payment source | Admitting: Physical Therapy

## 2013-10-21 ENCOUNTER — Ambulatory Visit (HOSPITAL_BASED_OUTPATIENT_CLINIC_OR_DEPARTMENT_OTHER): Payer: No Typology Code available for payment source

## 2013-10-21 VITALS — BP 107/66 | HR 105 | Temp 98.0°F | Resp 18

## 2013-10-21 DIAGNOSIS — Z452 Encounter for adjustment and management of vascular access device: Secondary | ICD-10-CM

## 2013-10-21 DIAGNOSIS — C210 Malignant neoplasm of anus, unspecified: Secondary | ICD-10-CM

## 2013-10-21 MED ORDER — HEPARIN SOD (PORK) LOCK FLUSH 100 UNIT/ML IV SOLN
250.0000 [IU] | Freq: Once | INTRAVENOUS | Status: AC
  Administered 2013-10-21: 250 [IU] via INTRAVENOUS
  Filled 2013-10-21: qty 5

## 2013-10-21 MED ORDER — SODIUM CHLORIDE 0.9 % IJ SOLN
10.0000 mL | Freq: Once | INTRAMUSCULAR | Status: AC
  Administered 2013-10-21: 10 mL
  Filled 2013-10-21: qty 10

## 2013-10-25 ENCOUNTER — Ambulatory Visit: Payer: No Typology Code available for payment source

## 2013-10-25 ENCOUNTER — Other Ambulatory Visit: Payer: Self-pay | Admitting: *Deleted

## 2013-10-25 ENCOUNTER — Ambulatory Visit (HOSPITAL_BASED_OUTPATIENT_CLINIC_OR_DEPARTMENT_OTHER): Payer: No Typology Code available for payment source | Admitting: Oncology

## 2013-10-25 ENCOUNTER — Other Ambulatory Visit (HOSPITAL_BASED_OUTPATIENT_CLINIC_OR_DEPARTMENT_OTHER): Payer: No Typology Code available for payment source | Admitting: Lab

## 2013-10-25 ENCOUNTER — Telehealth: Payer: Self-pay | Admitting: Oncology

## 2013-10-25 VITALS — BP 103/77 | HR 114 | Temp 97.1°F | Resp 20 | Ht 63.0 in | Wt 103.6 lb

## 2013-10-25 DIAGNOSIS — E538 Deficiency of other specified B group vitamins: Secondary | ICD-10-CM

## 2013-10-25 DIAGNOSIS — R21 Rash and other nonspecific skin eruption: Secondary | ICD-10-CM

## 2013-10-25 DIAGNOSIS — D509 Iron deficiency anemia, unspecified: Secondary | ICD-10-CM

## 2013-10-25 DIAGNOSIS — C210 Malignant neoplasm of anus, unspecified: Secondary | ICD-10-CM

## 2013-10-25 DIAGNOSIS — R42 Dizziness and giddiness: Secondary | ICD-10-CM

## 2013-10-25 LAB — BASIC METABOLIC PANEL (CC13)
Anion Gap: 11 mEq/L (ref 3–11)
Calcium: 9.2 mg/dL (ref 8.4–10.4)
Chloride: 106 mEq/L (ref 98–109)
Creatinine: 0.7 mg/dL (ref 0.6–1.1)
Potassium: 3.2 mEq/L — ABNORMAL LOW (ref 3.5–5.1)
Sodium: 143 mEq/L (ref 136–145)

## 2013-10-25 LAB — CBC WITH DIFFERENTIAL/PLATELET
Eosinophils Absolute: 0.1 10*3/uL (ref 0.0–0.5)
HCT: 32 % — ABNORMAL LOW (ref 34.8–46.6)
HGB: 10.7 g/dL — ABNORMAL LOW (ref 11.6–15.9)
MCH: 32.4 pg (ref 25.1–34.0)
MONO#: 0.7 10*3/uL (ref 0.1–0.9)
MONO%: 19.4 % — ABNORMAL HIGH (ref 0.0–14.0)
NEUT#: 2.2 10*3/uL (ref 1.5–6.5)
NEUT%: 64.5 % (ref 38.4–76.8)
RBC: 3.3 10*6/uL — ABNORMAL LOW (ref 3.70–5.45)
RDW: 17.4 % — ABNORMAL HIGH (ref 11.2–14.5)
WBC: 3.4 10*3/uL — ABNORMAL LOW (ref 3.9–10.3)
lymph#: 0.4 10*3/uL — ABNORMAL LOW (ref 0.9–3.3)

## 2013-10-25 MED ORDER — MECLIZINE HCL 25 MG PO TABS: 25.0000 mg | ORAL_TABLET | Freq: Three times a day (TID) | ORAL | Status: DC | PRN

## 2013-10-25 NOTE — Patient Instructions (Signed)
Peripherally Inserted Central Catheter (PICC) Removal and Care After A peripherally inserted catheter (PICC) is removed when it is no longer needed, when it is clotted, or when it may be infected.  PROCEDURE  The removal of a PICC is usually painless. Removing the tape that holds the PICC in place may be the most discomfort you have.  A physicians order needs to be obtained to have the PICC removed.  A PICC can be removed in the hospital or in an outpatient setting.  Never remove or take out the PICC yourself. Only a trained clinical professional, such as a PICC nurse, should remove the PICC.  If a PICC is suspected to be infected, the PICC tip is sent to the lab for culture. HOME CARE INSTRUCTIONS  When the PICC is out, pressure is applied at the insertion site to prevent bleeding. An antibiotic ointment may be applied to the insertion site. A dry, sterile gauze is then taped over the insertion site. This dressing should stay on for 24 hours.  After the 24 hours is up, the dressing may be removed. The PICC insertion site is very small. A small scab may develop over the insertion site. It is okay to wash the site gently with soap and water. Be careful to not remove or pick the scab off. After washing, gently pat the site dry. You do not need to put another dressing over the insertion site after you wash it.  Avoid heavy, strenuous physical activity for 24 hours after the PICC is removed. This includes things like:  Weight lifting.  Strenuous yard work.  Any physical activity with repetitive arm movement. SEEK MEDICAL CARE IF:  Call or see your caregiver as soon as possible if you develop the following conditions in the arm in which the PICC was inserted:  Swelling or puffiness.  Increasing tenderness or pain. SEEK IMMEDIATE MEDICAL CARE IF:  You develop any of the following conditions in the arm that had the PICC:  Numbness or tingling in your fingers, hand, or arm.  You arm has  a bluish color and it is cold to the touch.  Redness around the insertion site or a red-streak that goes up your arm.  Any type of drainage from the PICC insertion site. This includes drainage such as:  Bleeding from the insertion site. (If this happens, apply firm, direct pressure to the PICC insertion site with a clean towel.)  Drainage that is yellow or tan in color.  You have an oral temperature above 102 F (38.9 C), not controlled by medicine. Document Released: 04/27/2010 Document Revised: 01/30/2012 Document Reviewed: 04/27/2010 [employer] Patient Information 2014 [employer].  

## 2013-10-25 NOTE — Progress Notes (Signed)
  Radiation Oncology         [phone] ________________________________  Name: KINZLEE SELVY MRN: [id number]  Date: 10/16/2013  DOB: 1956-01-10  End of Treatment Note  Diagnosis:   Squamous carcinoma of the anal canal     Indication for treatment:  Curative        Radiation treatment dates:   09/09/2013 through 10/16/2013  Site/dose:   The patient was treated to the anal tumor and the at risk nodal regions. The high dose region received 50.4 gray in 28 fractions at 1.8 gray per fraction. The patient was treated using IMRT on our tomotherapy unit with daily image guidance.  Narrative: The patient tolerated radiation treatment relatively well.   The patient experienced significant skin irritation as expected during treatment. Healing is expected to a significant degree over several weeks.  Plan: The patient has completed radiation treatment. The patient will return to radiation oncology clinic for routine followup in one month. I advised the patient to call or return sooner if they have any questions or concerns related to their recovery or treatment. ________________________________  Radene Gunning, M.D., Ph.D.

## 2013-10-25 NOTE — Progress Notes (Signed)
   [facility]    OFFICE PROGRESS NOTE   INTERVAL HISTORY:   She returns for scheduled followup visit. She completed a second cycle of 5-fluorouracil and mitomycin C. with dose reductions on 10/08/2013. She didn't develop mouth sores following chemotherapy. She also had a progressive skin rash over the back and abdomen. The mouth sores have resolved and the rash is improving. The skin breakdown at the perineum persists but has improved with Silvadene cream. She is no longer taking pain medication. The radiation was completed on 10/16/2013.  She is drinking adequate fluids including "Gatorade ". She has started to eat over the last few days. She feels like it is now okay to remove the PICC. She received intravenous fluids in the office last week.   Erin [name] has a history of intermittent vertigo. She currently has positional vertigo. She requests a prescription for meclizine.  Objective:  Vital signs in last 24 hours:  Blood pressure 103/77, pulse 114, temperature 97.1 F (36.2 C), temperature source Oral, resp. rate 20, height 5\' 3"  (1.6 m), weight 103 lb 9.6 oz (46.993 kg).    HEENT: No thrush or ulcers Lymphatics: No inguinal nodes  Resp: Lungs clear bilaterally Cardio: Regular in rhythm GI: No hepatomegaly, nontender Vascular: No leg edema  Skin: Resolving rash over the back, chest, and abdomen. Radiation hyperpigmentation at the groin. Superficial skin breakdown at the perineum and labia.   Portacath/PICC-without erythema  Lab Results:  Lab Results  Component Value Date   WBC 3.4* 10/25/2013   HGB 10.7* 10/25/2013   HCT 32.0* 10/25/2013   MCV 96.9 10/25/2013   PLT 172 10/25/2013   ANC 2.2    Medications: I have reviewed the patient's current medications.  Assessment/Plan: 1. Squamous cell carcinoma of the anus, clinical stage II (T2 N0), status post endoscopic biopsy 08/14/2013 confirming invasive squamous cell carcinoma. Initiation of radiation and  cycle 1 5-FU/mitomycin C. 09/09/2013. Cycle 2 5-FU/mitomycin-C (dose reduction) completed beginning 10/08/2013, radiation completed 10/16/2013. 2. Rectal pain, constipation and bleeding secondary to #1. Resolved 3. Iron deficiency anemia. 4. Vitamin B12 deficiency. 5. Diabetes. 6. Hyperlipidemia. 7. Neurodermatitis. 8. Tobacco use. She reports she has quit smoking. 9. Mucositis following cycle 1 and cycle 2  5-FU/mitomycin C.  10. Skin rash. Initially felt to be related to the chemotherapy though somewhat atypical in appearance and distribution. The rash appears to be resolving. 11. 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 She currently has a flare of the vertigo and requests a prescription for meclizine.  Disposition:  Erin [name] has completed treatment for anal cancer. The anal pain has resolved. She is now taking adequate liquids. The PICC will be removed today.  She will return for an office visit on 11/28/2012. She will contact us in the interim if the skin breakdown does not resolve or she develops new symptoms.  Thornton Papas, MD  10/25/2013  1:53 PM

## 2013-10-25 NOTE — Progress Notes (Signed)
1240 PICC removal to R arm per MD orders, 40 cm catheter with clean edges intact. Pressure dressing applied using vaseline and 4x4 gauze. Patient instructed to lay flat for 30 minute for post observation. Tolerated treatment well with no complaints.  1315 VSS. Patient with no complaints. Education provided regarding PICC care and printout given on AVS. Patient gave verbal understanding and knows to call clinic or go to ED with any complications. Discharged ambulating with spouse.

## 2013-10-25 NOTE — Telephone Encounter (Signed)
appts made per 12/5 POF Cal mailed shh

## 2013-10-28 ENCOUNTER — Other Ambulatory Visit: Payer: Self-pay | Admitting: Nurse Practitioner

## 2013-10-28 ENCOUNTER — Telehealth: Payer: Self-pay | Admitting: *Deleted

## 2013-10-28 DIAGNOSIS — E876 Hypokalemia: Secondary | ICD-10-CM

## 2013-10-28 MED ORDER — POTASSIUM CHLORIDE CRYS ER 20 MEQ PO TBCR: 20.0000 meq | EXTENDED_RELEASE_TABLET | Freq: Every day | ORAL | Status: DC

## 2013-10-28 NOTE — Telephone Encounter (Signed)
Message copied by Raphael Gibney on Mon Oct 28, 2013  4:17 PM ------      Message from: [place], [place] K      Created: Mon Oct 28, 2013  4:00 PM       Please let her know her potassium level was low on recent blood work and we would like for her to begin Kdur 20 mEq daily with repeat labs in one week. She will need a prescription called her pharmacy for K-Dur 20 mEq one tablet daily dispense #20, no refills. I took care of the lab appointment.            Thanks.      ----- Message -----         From: [facility] Interface         Sent: 10/25/2013  11:34 AM           To: Rana Snare, NP                   ------

## 2013-10-28 NOTE — Telephone Encounter (Signed)
Spoke with patient's husband Billey Gosling) regarding patient's lab results.  Informed patient's husband that patient's potassium level was low on her recent blood work and a prescription for Kdur 20 mEq was sent to her pharmacy.  Also, informed patient's husband we will repeat her labs in one week.  Per Ebbie Ridge. Maisie Fus, NP.  Patient's husband verbalized understanding.

## 2013-10-29 ENCOUNTER — Telehealth: Payer: Self-pay | Admitting: Oncology

## 2013-10-29 NOTE — Telephone Encounter (Signed)
s.w. pt husband and advised on 12.15.14 appt...he said he will deliver the msg...ok and aware

## 2013-11-04 ENCOUNTER — Other Ambulatory Visit (HOSPITAL_BASED_OUTPATIENT_CLINIC_OR_DEPARTMENT_OTHER): Payer: No Typology Code available for payment source

## 2013-11-04 DIAGNOSIS — C210 Malignant neoplasm of anus, unspecified: Secondary | ICD-10-CM

## 2013-11-04 DIAGNOSIS — E876 Hypokalemia: Secondary | ICD-10-CM

## 2013-11-04 LAB — BASIC METABOLIC PANEL (CC13)
CO2: 24 mEq/L (ref 22–29)
Calcium: 9 mg/dL (ref 8.4–10.4)
Creatinine: 0.7 mg/dL (ref 0.6–1.1)
Glucose: 90 mg/dl (ref 70–140)
Potassium: 3.6 mEq/L (ref 3.5–5.1)

## 2013-11-26 ENCOUNTER — Telehealth: Payer: Self-pay | Admitting: *Deleted

## 2013-11-26 NOTE — Telephone Encounter (Signed)
VM left reporting both legs and ankles are swollen and sore. Called back and confirmed edema has been progressive over last week and extends from ankles to upper calves. Some pitting at ankles. Legs ache, but are not red or hot to touch. Edema is bilateral. Says chest does hurt when takes a deep breath since Saturday, and she does feel winded when she ambulates, but is able to talk without dyspnea. Denies any excess sodium intake or excess or prolonged standing or walking. Per Dr. Benay Spice : Doubtful it is DVT, could be lymphedema from the radiation. Should be OK to wait to evaluate on her 11/28/13 visit. Go to ER for any acute chest pain/dyspnea or legs become hot or she gets fever. Instructed her to push water, limit sodium and elevate feet/legs above her heart level as much as possible.

## 2013-11-27 ENCOUNTER — Encounter: Payer: Self-pay | Admitting: Radiation Oncology

## 2013-11-28 ENCOUNTER — Encounter (HOSPITAL_COMMUNITY): Payer: Self-pay

## 2013-11-28 ENCOUNTER — Ambulatory Visit (HOSPITAL_COMMUNITY)
Admission: RE | Admit: 2013-11-28 | Discharge: 2013-11-28 | Disposition: A | Discharge status: Home / Self Care | Payer: No Typology Code available for payment source | Source: Ambulatory Visit | Attending: Nurse Practitioner | Admitting: Nurse Practitioner

## 2013-11-28 ENCOUNTER — Ambulatory Visit
Admission: RE | Admit: 2013-11-28 | Discharge: 2013-11-28 | Disposition: A | Discharge status: Home / Self Care | Payer: No Typology Code available for payment source | Source: Ambulatory Visit | Attending: Radiation Oncology | Admitting: Radiation Oncology

## 2013-11-28 ENCOUNTER — Telehealth: Payer: Self-pay | Admitting: Oncology

## 2013-11-28 ENCOUNTER — Telehealth: Payer: Self-pay | Admitting: Nurse Practitioner

## 2013-11-28 ENCOUNTER — Ambulatory Visit (HOSPITAL_BASED_OUTPATIENT_CLINIC_OR_DEPARTMENT_OTHER): Payer: No Typology Code available for payment source | Admitting: Nurse Practitioner

## 2013-11-28 ENCOUNTER — Ambulatory Visit
Admission: RE | Admit: 2013-11-28 | Payer: No Typology Code available for payment source | Source: Ambulatory Visit | Admitting: Radiation Oncology

## 2013-11-28 ENCOUNTER — Encounter: Payer: Self-pay | Admitting: Radiation Oncology

## 2013-11-28 ENCOUNTER — Ambulatory Visit (HOSPITAL_BASED_OUTPATIENT_CLINIC_OR_DEPARTMENT_OTHER): Payer: No Typology Code available for payment source

## 2013-11-28 ENCOUNTER — Encounter: Payer: Self-pay | Admitting: Nurse Practitioner

## 2013-11-28 ENCOUNTER — Other Ambulatory Visit: Payer: PRIVATE HEALTH INSURANCE

## 2013-11-28 VITALS — BP 129/76 | HR 97 | Temp 97.6°F | Resp 20 | Wt 110.7 lb

## 2013-11-28 VITALS — BP 102/79 | HR 92 | Temp 96.5°F | Resp 20 | Ht 63.0 in | Wt 109.5 lb

## 2013-11-28 DIAGNOSIS — D509 Iron deficiency anemia, unspecified: Secondary | ICD-10-CM

## 2013-11-28 DIAGNOSIS — E876 Hypokalemia: Secondary | ICD-10-CM

## 2013-11-28 DIAGNOSIS — E538 Deficiency of other specified B group vitamins: Secondary | ICD-10-CM

## 2013-11-28 DIAGNOSIS — M79609 Pain in unspecified limb: Secondary | ICD-10-CM | POA: Insufficient documentation

## 2013-11-28 DIAGNOSIS — M7989 Other specified soft tissue disorders: Secondary | ICD-10-CM

## 2013-11-28 DIAGNOSIS — C210 Malignant neoplasm of anus, unspecified: Secondary | ICD-10-CM

## 2013-11-28 DIAGNOSIS — R21 Rash and other nonspecific skin eruption: Secondary | ICD-10-CM

## 2013-11-28 DIAGNOSIS — R000 Tachycardia, unspecified: Secondary | ICD-10-CM

## 2013-11-28 DIAGNOSIS — I2510 Atherosclerotic heart disease of native coronary artery without angina pectoris: Secondary | ICD-10-CM | POA: Insufficient documentation

## 2013-11-28 DIAGNOSIS — R0989 Other specified symptoms and signs involving the circulatory and respiratory systems: Secondary | ICD-10-CM

## 2013-11-28 DIAGNOSIS — I700 Atherosclerosis of aorta: Secondary | ICD-10-CM | POA: Insufficient documentation

## 2013-11-28 DIAGNOSIS — Z9221 Personal history of antineoplastic chemotherapy: Secondary | ICD-10-CM | POA: Insufficient documentation

## 2013-11-28 DIAGNOSIS — R0609 Other forms of dyspnea: Secondary | ICD-10-CM

## 2013-11-28 DIAGNOSIS — R609 Edema, unspecified: Secondary | ICD-10-CM | POA: Insufficient documentation

## 2013-11-28 DIAGNOSIS — Z923 Personal history of irradiation: Secondary | ICD-10-CM | POA: Insufficient documentation

## 2013-11-28 DIAGNOSIS — R079 Chest pain, unspecified: Secondary | ICD-10-CM | POA: Insufficient documentation

## 2013-11-28 LAB — COMPREHENSIVE METABOLIC PANEL (CC13)
ALT: 9 U/L (ref 0–55)
AST: 11 U/L (ref 5–34)
Albumin: 3.1 g/dL — ABNORMAL LOW (ref 3.5–5.0)
Alkaline Phosphatase: 80 U/L (ref 40–150)
Anion Gap: 11 mEq/L (ref 3–11)
BUN: 8.1 mg/dL (ref 7.0–26.0)
CALCIUM: 9.1 mg/dL (ref 8.4–10.4)
CHLORIDE: 108 meq/L (ref 98–109)
CO2: 25 meq/L (ref 22–29)
Creatinine: 0.6 mg/dL (ref 0.6–1.1)
Glucose: 91 mg/dl (ref 70–140)
Potassium: 2.8 mEq/L — CL (ref 3.5–5.1)
Sodium: 144 mEq/L (ref 136–145)
Total Bilirubin: 0.27 mg/dL (ref 0.20–1.20)
Total Protein: 6.2 g/dL — ABNORMAL LOW (ref 6.4–8.3)

## 2013-11-28 LAB — CBC WITH DIFFERENTIAL/PLATELET
BASO%: 0.3 % (ref 0.0–2.0)
BASOS ABS: 0 10*3/uL (ref 0.0–0.1)
EOS ABS: 0.4 10*3/uL (ref 0.0–0.5)
EOS%: 6.6 % (ref 0.0–7.0)
HEMATOCRIT: 31.2 % — AB (ref 34.8–46.6)
HGB: 10.6 g/dL — ABNORMAL LOW (ref 11.6–15.9)
LYMPH#: 0.8 10*3/uL — AB (ref 0.9–3.3)
LYMPH%: 12.4 % — AB (ref 14.0–49.7)
MCH: 34.3 pg — ABNORMAL HIGH (ref 25.1–34.0)
MCHC: 33.9 g/dL (ref 31.5–36.0)
MCV: 101.4 fL — ABNORMAL HIGH (ref 79.5–101.0)
MONO#: 0.9 10*3/uL (ref 0.1–0.9)
MONO%: 14.7 % — ABNORMAL HIGH (ref 0.0–14.0)
NEUT%: 66 % (ref 38.4–76.8)
NEUTROS ABS: 4 10*3/uL (ref 1.5–6.5)
PLATELETS: 313 10*3/uL (ref 145–400)
RBC: 3.08 10*6/uL — ABNORMAL LOW (ref 3.70–5.45)
RDW: 15.4 % — ABNORMAL HIGH (ref 11.2–14.5)
WBC: 6.1 10*3/uL (ref 3.9–10.3)

## 2013-11-28 MED ORDER — IOHEXOL 350 MG/ML SOLN
100.0000 mL | Freq: Once | INTRAVENOUS | Status: AC | PRN
  Administered 2013-11-28: 100 mL via INTRAVENOUS

## 2013-11-28 MED ORDER — POTASSIUM CHLORIDE CRYS ER 20 MEQ PO TBCR: EXTENDED_RELEASE_TABLET | ORAL | Status: DC

## 2013-11-28 MED ORDER — ONDANSETRON HCL 8 MG PO TABS: 8.0000 mg | ORAL_TABLET | Freq: Two times a day (BID) | ORAL | Status: DC | PRN

## 2013-11-28 NOTE — Progress Notes (Addendum)
OFFICE PROGRESS NOTE  Interval history:  Erin [name] returns for followup of anal cancer. Skin at the perineum has healed. She notes decreased rectal pain. She has an "achy" pain intermittently at the perineum. She is  constipated. She takes a stool softener and laxative as needed.  She reports dyspnea on exertion "for months". For the past several weeks she has noted bilateral leg swelling below the knees. The swelling worsens during the day. Legs are "sore". On the morning of 11/23/2013 she noted chest pain with deep inspiration and a mild cough. The chest pain is located deep to the lower sternum. No fever. The pain and cough are improving.  She became markedly dyspneic with ambulation in the office. Heart rate increased into the 150 range. Oxygen saturation remained 97% or higher.   Objective: Filed Vitals:   11/28/13 1051  BP:   Pulse: 92  Temp:   Resp:    No thrush. No palpable cervical, supraclavicular, axillary lymph nodes. Lungs clear. No wheezes or rales. Heart is regular, tachycardic. Abdomen soft and nontender. No hepatomegaly. Trace to 1+ edema below the knees bilaterally left slightly greater than right. Entire length of both legs is markedly tender to light touch. Marked tenderness lower sternum. Radiation hyperpigmentation at the perineum. No skin breakdown. Resolving rash over the upper back.   Lab Results: Lab Results  Component Value Date   WBC 6.1 11/28/2013   HGB 10.6* 11/28/2013   HCT 31.2* 11/28/2013   MCV 101.4* 11/28/2013   PLT 313 11/28/2013   NEUTROABS 4.0 11/28/2013    Chemistry:    Chemistry      Component Value Date/Time   NA 144 11/28/2013 1148   NA 142 07/30/2013 0903   K 2.8* 11/28/2013 1148   K 4.8 07/30/2013 0903   CL 111 07/30/2013 0903   CO2 25 11/28/2013 1148   CO2 25 07/30/2013 0903   BUN 8.1 11/28/2013 1148   BUN 18 07/30/2013 0903   CREATININE 0.6 11/28/2013 1148   CREATININE 0.8 07/30/2013 0903      Component Value Date/Time   CALCIUM 9.1 11/28/2013 1148   CALCIUM 8.6 07/30/2013 0903   ALKPHOS 80 11/28/2013 1148   ALKPHOS 50 08/05/2013 1247   AST 11 11/28/2013 1148   AST 16 08/05/2013 1247   ALT 9 11/28/2013 1148   ALT 8 08/05/2013 1247   BILITOT 0.27 11/28/2013 1148   BILITOT 0.5 08/05/2013 1247       Studies/Results: No results found.  Medications: I have reviewed the patient's current medications.  Assessment/Plan: 1. Squamous cell carcinoma of the anus, clinical stage II (T2 N0), status post endoscopic biopsy 08/14/2013 confirming invasive squamous cell carcinoma. Initiation of radiation and cycle 1 5-FU/mitomycin C. 09/09/2013. Cycle 2 5-FU/mitomycin-C (dose reduction) completed beginning 10/08/2013, radiation completed 10/16/2013. 2. Rectal pain, constipation and bleeding secondary to #1. Resolved 3. Iron deficiency anemia. 4. Vitamin B12 deficiency. 5. Diabetes. 6. Hyperlipidemia. 7. Neurodermatitis. 8. Tobacco use. She reports she has quit smoking. 9. Mucositis following cycle 1 and cycle 2 5-FU/mitomycin C.  10. Skin rash. Initially felt to be related to the chemotherapy though somewhat atypical in appearance and distribution. The rash appears to be resolving. 11. 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  12. Dyspnea, chest pain/tenderness, tachycardia, leg swelling. 13. Hypokalemia. She will resume Kdur (see instructions below).   Dispositon-Erin [name] is in clinical remission from anal cancer.  She is experiencing dyspnea and chest pain/tenderness of unclear etiology.  On exam she has bilateral leg edema. She became significantly tachycardic with ambulation. We referred her for an urgent chest CT. There was no evidence of a pulmonary embolism. We also referred her for bilateral lower extremity venous Doppler studies which were negative for DVT.  The chest pain/tenderness is likely a benign musculoskeletal condition. In the setting of a normal oxygen saturation and negative chest CT,  we have a low suspicion for a primary cardiopulmonary process. The leg edema may be related to radiation therapy.  She was found to have hypokalemia on labs done today. She will begin Kdur 20 mEq twice daily for 3 days, then one tablet daily and return for followup labs on 12/03/2013.  She will return for a followup visit in 4-6 weeks. She will contact the office if the above symptoms persist.  Patient seen with Dr. Benay Spice.    45 minutes were spent face-to-face at today's visit with the majority of that time involved in counseling/coordination of care.   [name] ANP/GNP-BC   This was a shared visit with [name]. Erin [name] was interviewed and examined. The etiology of the dyspnea and tachycardia is unclear.. The CT was negative for a pulmonary embolism, pneumonia, or heart failure. She will contact us for persistent symptoms.  It is possible the leg edema is related to pelvic radiation.  Julieanne Manson, M.D.

## 2013-11-28 NOTE — Progress Notes (Signed)
Radiation Oncology         [phone] ________________________________  Name: Erin [name] MRN: [id number]  Date: 11/28/2013  DOB: 1956/07/13  Follow-Up Visit Note  CC: [name], DO  [name], DO  Diagnosis:   Squamous cell carcinoma of the anal canal  Interval Since Last Radiation:  One month   Narrative:  The patient returns today for routine follow-up.  The patient was seen by medical oncology earlier today. She is having some swelling and a seated to undergo ultrasound of the lower extremities as well as a CT angiogram of the chest. For these studies were negative for thrombosis/embolism. The patient states that her skin has healed well. Improved discomfort although some residual discomfort in the anal/rectal region. Bowels have been more normal.                              ALLERGIES:  is allergic to dexamethasone; claritin; sulfa antibiotics; and prednisone.  Meds: Current Outpatient Prescriptions  Medication Sig Dispense Refill  . bisacodyl (DULCOLAX) 5 MG EC tablet Take 15 mg by mouth daily as needed for constipation.      . cimetidine (TAGAMET) 200 MG tablet Take 200 mg by mouth as needed.      . ferrous sulfate 325 (65 FE) MG tablet Take 325 mg by mouth 2 (two) times daily.      . fluticasone (FLONASE) 50 MCG/ACT nasal spray Place 2 sprays into the nose daily.  16 g  6  . gabapentin (NEURONTIN) 600 MG tablet TAKE 1 TABLET THREE TIMES A DAY  180 tablet  1  . HYDROmorphone (DILAUDID) 2 MG tablet Take 1 tablet (2 mg total) by mouth every 4 (four) hours as needed for severe pain.  30 tablet  0  . Lidocaine, Anorectal, 5 % CREA Apply 1 application topically as needed. Apply to affected area prn, is an ointment  30 g  0  . meclizine (MEDI-MECLIZINE) 25 MG tablet Take 1 tablet (25 mg total) by mouth 3 (three) times daily as needed for dizziness.  30 tablet  0  . ondansetron (ZOFRAN) 8 MG tablet Take 1 tablet (8 mg total) by mouth every 12 (twelve) hours as needed  for nausea.  20 tablet  0  . potassium chloride SA (K-DUR,KLOR-CON) 20 MEQ tablet Take 1 tablet twice daily for 3 days and then 1 daily.  30 tablet  0  . Probiotic Product (PROBIOTIC DAILY PO) Take by mouth daily.       . prochlorperazine (COMPAZINE) 10 MG tablet Take 1 tablet (10 mg total) by mouth every 6 (six) hours as needed (nausea).  60 tablet  1  . traMADol (ULTRAM) 50 MG tablet Take 1 tablet (50 mg total) by mouth every 6 (six) hours as needed (1-2 every 6 hours as needed. Maximum 8 tablets per day.).  90 tablet  0  . vitamin B-12 (CYANOCOBALAMIN) 1000 MCG tablet Place 1,000 mcg under the tongue daily.      . vitamin E 1000 UNIT capsule Take 1,000 Units by mouth daily.       No current facility-administered medications for this encounter.    Physical Findings: The patient is in no acute distress. Patient is alert and oriented.  weight is 110 lb 11.2 oz (50.213 kg). Her oral temperature is 97.6 F (36.4 C). Her blood pressure is 129/76 and her pulse is 97. Her respiration is 20. Marland Kitchen  The patient's scan shows some radiation effect/change as expected. The skin is healing well. No desquamation. Bilateral edema present in the lower extremities symmetrically.  Lab Findings: Lab Results  Component Value Date   WBC 6.1 11/28/2013   HGB 10.6* 11/28/2013   HCT 31.2* 11/28/2013   MCV 101.4* 11/28/2013   PLT 313 11/28/2013     Radiographic Findings: Ct Angio Chest Pe W/cm &/or Wo Cm  11/28/2013   CLINICAL DATA:  Anal cancer. Status post chemo therapy and radiation therapy. Tachycardia. Chest pain. Severe dyspnea on exertion.  EXAM: CT ANGIOGRAPHY CHEST WITH CONTRAST  TECHNIQUE: Multidetector CT imaging of the chest was performed using the standard protocol during bolus administration of intravenous contrast. Multiplanar CT image reconstructions including MIPs were obtained to evaluate the vascular anatomy.  CONTRAST:  168mL OMNIPAQUE IOHEXOL 350 MG/ML SOLN  COMPARISON:  NM PET IMAGE INITIAL (PI) SKULL  BASE TO THIGH dated 08/29/2013  FINDINGS: Lungs/Pleura:  Biapical pleural parenchymal scarring.  Minimal motion degradation inferiorly.  No nodules or airspace opacities. No pleural fluid.  Heart/Mediastinum: The quality of this examination for evaluation of pulmonary embolism is good. No evidence of pulmonary embolism.  No aortic dissection or aneurysm. Normal heart size. Aortic atherosclerosis. Suspect coronary artery atherosclerosis.  No mediastinal or hilar adenopathy.  Upper Abdomen:  No significant findings.  Bones/Musculoskeletal:  No acute osseous abnormality.  Review of the MIP images confirms the above findings.  IMPRESSION: 1.  No evidence of pulmonary embolism. 2. Aortic atherosclerosis. Suspicion of age advanced coronary artery atherosclerosis. Correlate with risk factors.   Electronically Signed   By: Abigail Miyamoto M.D.   On: 11/28/2013 13:29    Impression:    The patient's skin is doing well at this time and she has been recovering satisfactorily from her radiation treatment.  Plan:  Followup in 3 months. Suspicion is that the patient's radiation to the lymph nodes within the pelvis/inguinal region is a contributing factor to her lower extremity edema. Discussed increasing protein intake based on lab values. Patient watching sodium intake and also has begun to raise her legs when at home. I will make a referral to physical therapy.   Jodelle Gross, M.D., Ph.D.

## 2013-11-28 NOTE — Telephone Encounter (Signed)
appts made per 1/8 POF LVMM at home phone of appt d/t shh

## 2013-11-28 NOTE — Progress Notes (Signed)
Follow up anal, 09/09/13-10/16/13 had Ct chest today and U/S  B/l done today  Both negative, legs soreness and swelling,  and difficult walking, says inside vagina deep has had pain today earlier , appetite wonderful, 3:22 PM

## 2013-11-28 NOTE — Progress Notes (Signed)
VASCULAR LAB PRELIMINARY  PRELIMINARY  PRELIMINARY  PRELIMINARY  Bilateral lower extremity venous duplex completed.    Preliminary report:  Bilateral:  No evidence of DVT, superficial thrombosis, or Baker's Cyst.   Eamonn Sermeno, RVS 11/28/2013, 3:30 PM

## 2013-11-28 NOTE — Telephone Encounter (Signed)
Nurse Wilfred Curtis presented to my desk requesting STAT labs add on and STAT CT Angio  Worked LT POF Took referral for CT angio to [facility] for pior authorization shh

## 2013-11-28 NOTE — Telephone Encounter (Signed)
Nurse Wilfred Curtis came to desk requesting stat US Bilateral lower ext [place] in [place] who said send pt now for this STAT US shh

## 2013-11-28 NOTE — Telephone Encounter (Signed)
Received call from [facility] in [place] that she had the CT Angio authorized by [employer] and would input authorization into the system shh

## 2013-12-06 ENCOUNTER — Ambulatory Visit: Payer: No Typology Code available for payment source | Attending: Radiation Oncology | Admitting: Physical Therapy

## 2013-12-06 DIAGNOSIS — R5383 Other fatigue: Secondary | ICD-10-CM

## 2013-12-06 DIAGNOSIS — I890 Lymphedema, not elsewhere classified: Secondary | ICD-10-CM | POA: Insufficient documentation

## 2013-12-06 DIAGNOSIS — R5381 Other malaise: Secondary | ICD-10-CM | POA: Insufficient documentation

## 2013-12-06 DIAGNOSIS — IMO0001 Reserved for inherently not codable concepts without codable children: Secondary | ICD-10-CM | POA: Insufficient documentation

## 2013-12-06 DIAGNOSIS — C218 Malignant neoplasm of overlapping sites of rectum, anus and anal canal: Secondary | ICD-10-CM | POA: Insufficient documentation

## 2013-12-10 ENCOUNTER — Ambulatory Visit: Payer: No Typology Code available for payment source

## 2013-12-12 ENCOUNTER — Ambulatory Visit: Payer: No Typology Code available for payment source | Admitting: Physical Therapy

## 2013-12-12 ENCOUNTER — Ambulatory Visit: Payer: No Typology Code available for payment source | Admitting: Radiation Oncology

## 2013-12-17 ENCOUNTER — Ambulatory Visit: Payer: No Typology Code available for payment source

## 2013-12-19 ENCOUNTER — Ambulatory Visit: Payer: No Typology Code available for payment source | Admitting: Physical Therapy

## 2013-12-27 ENCOUNTER — Other Ambulatory Visit (HOSPITAL_BASED_OUTPATIENT_CLINIC_OR_DEPARTMENT_OTHER): Payer: No Typology Code available for payment source

## 2013-12-27 ENCOUNTER — Telehealth: Payer: Self-pay | Admitting: Oncology

## 2013-12-27 ENCOUNTER — Ambulatory Visit (HOSPITAL_BASED_OUTPATIENT_CLINIC_OR_DEPARTMENT_OTHER): Payer: No Typology Code available for payment source | Admitting: Oncology

## 2013-12-27 ENCOUNTER — Encounter: Payer: No Typology Code available for payment source | Admitting: Physical Therapy

## 2013-12-27 ENCOUNTER — Ambulatory Visit: Payer: No Typology Code available for payment source | Admitting: Physical Therapy

## 2013-12-27 VITALS — BP 162/92 | HR 98 | Temp 97.0°F | Resp 18 | Ht 63.0 in | Wt 111.7 lb

## 2013-12-27 DIAGNOSIS — H814 Vertigo of central origin: Secondary | ICD-10-CM

## 2013-12-27 DIAGNOSIS — C210 Malignant neoplasm of anus, unspecified: Secondary | ICD-10-CM

## 2013-12-27 DIAGNOSIS — D509 Iron deficiency anemia, unspecified: Secondary | ICD-10-CM

## 2013-12-27 DIAGNOSIS — E538 Deficiency of other specified B group vitamins: Secondary | ICD-10-CM

## 2013-12-27 DIAGNOSIS — E876 Hypokalemia: Secondary | ICD-10-CM

## 2013-12-27 DIAGNOSIS — E785 Hyperlipidemia, unspecified: Secondary | ICD-10-CM

## 2013-12-27 DIAGNOSIS — E119 Type 2 diabetes mellitus without complications: Secondary | ICD-10-CM

## 2013-12-27 LAB — BASIC METABOLIC PANEL (CC13)
ANION GAP: 10 meq/L (ref 3–11)
BUN: 8.4 mg/dL (ref 7.0–26.0)
CHLORIDE: 107 meq/L (ref 98–109)
CO2: 27 mEq/L (ref 22–29)
CREATININE: 0.7 mg/dL (ref 0.6–1.1)
Calcium: 9.3 mg/dL (ref 8.4–10.4)
Glucose: 88 mg/dl (ref 70–140)
POTASSIUM: 3.2 meq/L — AB (ref 3.5–5.1)
Sodium: 144 mEq/L (ref 136–145)

## 2013-12-27 MED ORDER — POTASSIUM CHLORIDE CRYS ER 20 MEQ PO TBCR: 20.0000 meq | EXTENDED_RELEASE_TABLET | Freq: Every day | ORAL | Status: DC

## 2013-12-27 NOTE — Progress Notes (Signed)
   [place]    OFFICE PROGRESS NOTE   INTERVAL HISTORY:   Erin [name] returns for scheduled followup of anal cancer. She feels well. No dyspnea or leg swelling. She reports the "rehabilitation "program helped. The skin breakdown at the perineum has resolved.  Objective:  Vital signs in last 24 hours:  There were no vitals taken for this visit.    HEENT: Neck without mass Lymphatics: No cervical, supraclavicular, axillary, or inguinal nodes Resp: Lungs clear bilaterally Cardio: Regular rate and rhythm GI: No hepatomegaly, nontender, no mass Vascular: No leg edema  Skin: Radiation hyperpigmentation at the perineum. No skin breakdown. Soft raised area at the right side of the inferior anal verge. Partial alopecia.  Lab Results:  Potassium 3.2, BUN 8.4, creatinine 0.7   Medications: I have reviewed the patient's current medications.  Assessment/Plan: 1. Squamous cell carcinoma of the anus, clinical stage II (T2 N0), status post endoscopic biopsy 08/14/2013 confirming invasive squamous cell carcinoma. Initiation of radiation and cycle 1 5-FU/mitomycin C. 09/09/2013. Cycle 2 5-FU/mitomycin-C (dose reduction) completed beginning 10/08/2013, radiation completed 10/16/2013. 2. Rectal pain, constipation and bleeding secondary to #1. Resolved 3. History of Iron deficiency anemia. 4. Vitamin B12 deficiency. 5. Diabetes. 6. Hyperlipidemia. 7. Neurodermatitis. 8. Tobacco use. She reports she has quit smoking. 9. Mucositis following cycle 1 and cycle 2 5-FU/mitomycin C.  10. Skin rash. Initially felt to be related to the chemotherapy though somewhat atypical in appearance and distribution. The rash has resolved. 11. 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  12. Hypokalemia. She is taking potassium.   Disposition:  Her performance status is improved today. We will refer her to Dr. Fuller Plan for an endoscopic anal exam.  She is scheduled to see Dr. Lisbeth Renshaw in April. [name] will return for an office visit in 4 months. We will check a potassium level when she returns in April.   Erin Coder, MD  12/27/2013  11:19 AM

## 2013-12-27 NOTE — Telephone Encounter (Signed)
lvm for pt regarding to April adn June...mailed pt appt shced avs and letter

## 2013-12-27 NOTE — Progress Notes (Signed)
Provided patient with wig voucher to use at [facility] for alopecia.

## 2013-12-31 ENCOUNTER — Encounter: Payer: No Typology Code available for payment source | Admitting: Physical Therapy

## 2014-01-01 ENCOUNTER — Ambulatory Visit: Payer: No Typology Code available for payment source | Admitting: Physical Therapy

## 2014-01-03 ENCOUNTER — Other Ambulatory Visit: Payer: Self-pay | Admitting: Family Medicine

## 2014-01-03 ENCOUNTER — Encounter: Payer: No Typology Code available for payment source | Admitting: Physical Therapy

## 2014-01-07 ENCOUNTER — Encounter: Payer: No Typology Code available for payment source | Admitting: Physical Therapy

## 2014-01-09 ENCOUNTER — Encounter: Payer: No Typology Code available for payment source | Admitting: Physical Therapy

## 2014-01-10 ENCOUNTER — Encounter: Payer: No Typology Code available for payment source | Admitting: Physical Therapy

## 2014-01-17 ENCOUNTER — Encounter: Payer: No Typology Code available for payment source | Admitting: Physical Therapy

## 2014-01-17 ENCOUNTER — Other Ambulatory Visit: Payer: Self-pay | Admitting: *Deleted

## 2014-01-17 DIAGNOSIS — E876 Hypokalemia: Secondary | ICD-10-CM

## 2014-01-17 DIAGNOSIS — C210 Malignant neoplasm of anus, unspecified: Secondary | ICD-10-CM

## 2014-01-17 MED ORDER — POTASSIUM CHLORIDE CRYS ER 20 MEQ PO TBCR: 20.0000 meq | EXTENDED_RELEASE_TABLET | Freq: Every day | ORAL | Status: DC

## 2014-02-25 ENCOUNTER — Telehealth: Payer: Self-pay | Admitting: Nurse Practitioner

## 2014-02-25 NOTE — Telephone Encounter (Signed)
per LT pt appt chgd to 6/2. Cld & spoke to pt & adv of new time & date. Pt understood and stated she will ck MY CHART to make sure new appt appears

## 2014-02-27 ENCOUNTER — Encounter: Payer: Self-pay | Admitting: Radiation Oncology

## 2014-02-27 ENCOUNTER — Ambulatory Visit
Admission: RE | Admit: 2014-02-27 | Discharge: 2014-02-27 | Disposition: A | Discharge status: Home / Self Care | Payer: No Typology Code available for payment source | Source: Ambulatory Visit | Attending: Radiation Oncology | Admitting: Radiation Oncology

## 2014-02-27 ENCOUNTER — Other Ambulatory Visit (HOSPITAL_BASED_OUTPATIENT_CLINIC_OR_DEPARTMENT_OTHER): Payer: No Typology Code available for payment source

## 2014-02-27 VITALS — BP 111/70 | HR 83 | Temp 98.2°F | Resp 20 | Ht 63.0 in | Wt 109.3 lb

## 2014-02-27 DIAGNOSIS — E876 Hypokalemia: Secondary | ICD-10-CM

## 2014-02-27 DIAGNOSIS — C210 Malignant neoplasm of anus, unspecified: Secondary | ICD-10-CM

## 2014-02-27 DIAGNOSIS — E119 Type 2 diabetes mellitus without complications: Secondary | ICD-10-CM

## 2014-02-27 LAB — BASIC METABOLIC PANEL (CC13)
ANION GAP: 8 meq/L (ref 3–11)
BUN: 7 mg/dL (ref 7.0–26.0)
CALCIUM: 9 mg/dL (ref 8.4–10.4)
CO2: 26 mEq/L (ref 22–29)
Chloride: 107 mEq/L (ref 98–109)
Creatinine: 0.9 mg/dL (ref 0.6–1.1)
Glucose: 66 mg/dl — ABNORMAL LOW (ref 70–140)
POTASSIUM: 3.5 meq/L (ref 3.5–5.1)
Sodium: 141 mEq/L (ref 136–145)

## 2014-02-27 NOTE — Progress Notes (Signed)
Follow up anal rad txs 09/09/13-10/16/13, c/o pain 3-4 rectal on 10 scale, aching and stabbing alternately, 3-4 weeks ago was constipated and tried to lubricate with preparation  And felt a hard knot up in her rctum , stated it felt like before, she is afraid of cancer coming back, very fatigued, appetite fair ,forces herself to eat, drinks a lot of tea, 1 coke a day stated 3:48 PM

## 2014-02-28 ENCOUNTER — Telehealth: Payer: Self-pay | Admitting: *Deleted

## 2014-02-28 ENCOUNTER — Telehealth: Payer: Self-pay | Admitting: Gastroenterology

## 2014-02-28 NOTE — Telephone Encounter (Signed)
Left message for patient to call back regarding lab results.

## 2014-02-28 NOTE — Telephone Encounter (Signed)
Called patient to inform of appt. With Dr. Lynne Leader PA on 03-03-14- arrival time - 3:30 pm, lvm for a return call

## 2014-02-28 NOTE — Telephone Encounter (Signed)
Message copied by Norma Fredrickson on Fri Feb 28, 2014 11:36 AM ------      Message from: [place], [place] P      Created: Fri Feb 28, 2014 11:06 AM                   ----- Message -----         From: Ladell Pier, MD         Sent: 02/27/2014   8:49 PM           To: Tania Ade, RN, Ludwig Lean, RN, #            Please call patient. Potassium is norma, cont. Kcl, f/u as scheduled ------

## 2014-02-28 NOTE — Progress Notes (Signed)
Radiation Oncology         [phone] ________________________________  Name: Erin [name] MRN: [id number]  Date: 02/27/2014  DOB: 1956-06-25  Follow-Up Visit Note  CC: [name], DO  [name], DO  Diagnosis:   Carcinoma of the anal canal  Interval Since Last Radiation:   4 months   Narrative:  The patient returns today for routine follow-up.  The patient indicates that she is experiencing some increased pain in the anal/rectal region. This improved significantly after treatment but she states that she noticed what she feels was a small not within the rectum and she was applying some lubrication secondary to constipation. This has concerned her and she feels that she has been experiencing increased soreness in this area since that time. This has persisted for several weeks. She has not noticed any other changes. No blood per. No changes in bowel movements otherwise and no urinary changes. The patient has been taking Aleve for this with relief. The patient sought medical oncology previously and was being set up for anoscopy with Dr. Fuller Plan. However the patient is unaware of this appointment. This was going to be set up prior to these complaints. She has no complaints with regards to the scan in the anal region. She feels this has healed very well.                             ALLERGIES:  is allergic to dexamethasone; claritin; sulfa antibiotics; and prednisone.  Meds: Current Outpatient Prescriptions  Medication Sig Dispense Refill  . Biotin 10 MG TABS Take 10 mg by mouth daily.      . bisacodyl (DULCOLAX) 5 MG EC tablet Take 15 mg by mouth daily as needed for constipation.      . cimetidine (TAGAMET) 200 MG tablet Take 200 mg by mouth as needed.      . ferrous sulfate 325 (65 FE) MG tablet Take 325 mg by mouth daily with breakfast.      . fluticasone (FLONASE) 50 MCG/ACT nasal spray Place 2 sprays into the nose daily.  16 g  6  . gabapentin (NEURONTIN) 600 MG tablet TAKE 1  TABLET THREE TIMES A DAY  180 tablet  0  . magnesium oxide (MAG-OX) 400 MG tablet Take 400 mg by mouth daily.      . meclizine (MEDI-MECLIZINE) 25 MG tablet Take 1 tablet (25 mg total) by mouth 3 (three) times daily as needed for dizziness.  30 tablet  0  . naproxen sodium (ANAPROX) 220 MG tablet Take 220 mg by mouth 2 (two) times daily with a meal.      . ondansetron (ZOFRAN) 8 MG tablet Take 1 tablet (8 mg total) by mouth every 12 (twelve) hours as needed for nausea.  20 tablet  0  . potassium chloride SA (K-DUR,KLOR-CON) 20 MEQ tablet Take 1 tablet (20 mEq total) by mouth daily.  90 tablet  4  . Probiotic Product (PROBIOTIC DAILY PO) Take by mouth daily.       . vitamin B-12 (CYANOCOBALAMIN) 1000 MCG tablet Place 1,000 mcg under the tongue daily.      . vitamin E 1000 UNIT capsule Take 1,000 Units by mouth daily.       No current facility-administered medications for this encounter.    Physical Findings: The patient is in no acute distress. Patient is alert and oriented.  height is 5\' 3"  (1.6 m) and weight  is 109 lb 4.8 oz (49.578 kg). Her oral temperature is 98.2 F (36.8 C). Her blood pressure is 111/70 and her pulse is 83. Her respiration is 20. .   The patient asked for the exam to be deferred today given that she understands the need for upcoming anoscopy.   Lab Findings: Lab Results  Component Value Date   WBC 6.1 11/28/2013   HGB 10.6* 11/28/2013   HCT 31.2* 11/28/2013   MCV 101.4* 11/28/2013   PLT 313 11/28/2013     Radiographic Findings: No results found.  Impression:    The patient has some increased pain in the rectal region with a possible not in this area her the patient. She is being set up for Anoscopy by GI and we will check on this. Because of his upcoming appointment she asked that the exam deferred today.  Plan:  Followup in 4 months and we will followup on the results of the upcoming evaluation.  I spent 15 minutes with the patient today, the majority of which was  spent counseling the patient on the diagnosis of cancer and coordinating care.   Jodelle Gross, M.D., Ph.D.

## 2014-02-28 NOTE — Telephone Encounter (Signed)
Patient is scheduled for 03/03/14 with Nicoletta Ba PA .  New knot in rectal area with new pain.  She has a history of rectal cancer

## 2014-03-03 ENCOUNTER — Encounter: Payer: Self-pay | Admitting: Physician Assistant

## 2014-03-03 ENCOUNTER — Telehealth: Payer: Self-pay | Admitting: *Deleted

## 2014-03-03 ENCOUNTER — Ambulatory Visit (INDEPENDENT_AMBULATORY_CARE_PROVIDER_SITE_OTHER): Payer: No Typology Code available for payment source | Admitting: Physician Assistant

## 2014-03-03 VITALS — BP 138/82 | HR 92 | Ht 61.75 in | Wt 114.0 lb

## 2014-03-03 DIAGNOSIS — Z85048 Personal history of other malignant neoplasm of rectum, rectosigmoid junction, and anus: Secondary | ICD-10-CM

## 2014-03-03 DIAGNOSIS — K6289 Other specified diseases of anus and rectum: Secondary | ICD-10-CM

## 2014-03-03 MED ORDER — LUBIPROSTONE 8 MCG PO CAPS: 8.0000 ug | ORAL_CAPSULE | Freq: Two times a day (BID) | ORAL | Status: DC

## 2014-03-03 NOTE — Telephone Encounter (Signed)
Called patient with lab result and informed to continue taking Kcl.  Per Dr. Benay Spice. Patient verbalized understanding and stated she would see [place] in June.

## 2014-03-03 NOTE — Progress Notes (Signed)
Subjective:    Patient ID: Erin [name], female    DOB: 1956/06/23, 58 y.o.   MRN: [id number]  HPI   Erin [name] is a pleasant 58 year old white female known to Dr. 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 This was a T2 N0 lesion with biopsies confirming an invasive squamous cell carcinoma. She underwent radiation and chemotherapy both of which she completed in November of 2014 and is being followed by Dr. Benay Spice . She did have some problems with lower extremity edema after radiation which she says has resolved. She says she had been doing well until about 6 weeks ago when she started noticing and intermittent jabbing or shooting-type pain in her rectum which has been present on a daily basis since. She says the pain is not constant- it comes and goes throughout the day. She has had chronic problems with constipation with no recent changes and has not noticed any melena or hematochezia. She does feel that the pain is worse with bowel movements. She says she also felt inside her rectum and can feel a small  knot which is what she felt previously when the anal cancer had been diagnosed. She is referred back today for consideration of sigmoidoscopy per Dr. Benay Spice.    Review of Systems  Constitutional: Negative.   HENT: Negative.   Eyes: Negative.   Respiratory: Negative.   Gastrointestinal: Positive for rectal pain.  Endocrine: Negative.   Genitourinary: Negative.   Musculoskeletal: Negative.   Skin: Negative.   Allergic/Immunologic: Negative.   Neurological: Negative.   Hematological: Negative.   Psychiatric/Behavioral: Negative.    Outpatient Prescriptions Prior to Visit  Medication Sig Dispense Refill  . Biotin 10 MG TABS Take 10 mg by mouth daily.      . bisacodyl (DULCOLAX) 5 MG EC tablet Take 15 mg by mouth daily as needed for constipation.      . cimetidine (TAGAMET) 200 MG tablet Take 200 mg by mouth as needed.      . ferrous sulfate 325  (65 FE) MG tablet Take 325 mg by mouth daily with breakfast.      . fluticasone (FLONASE) 50 MCG/ACT nasal spray Place 2 sprays into the nose daily.  16 g  6  . gabapentin (NEURONTIN) 600 MG tablet TAKE 1 TABLET THREE TIMES A DAY  180 tablet  0  . magnesium oxide (MAG-OX) 400 MG tablet Take 400 mg by mouth daily.      . meclizine (MEDI-MECLIZINE) 25 MG tablet Take 1 tablet (25 mg total) by mouth 3 (three) times daily as needed for dizziness.  30 tablet  0  . naproxen sodium (ANAPROX) 220 MG tablet Take 220 mg by mouth 2 (two) times daily with a meal.      . ondansetron (ZOFRAN) 8 MG tablet Take 1 tablet (8 mg total) by mouth every 12 (twelve) hours as needed for nausea.  20 tablet  0  . potassium chloride SA (K-DUR,KLOR-CON) 20 MEQ tablet Take 1 tablet (20 mEq total) by mouth daily.  90 tablet  4  . Probiotic Product (PROBIOTIC DAILY PO) Take by mouth daily.       . vitamin B-12 (CYANOCOBALAMIN) 1000 MCG tablet Place 1,000 mcg under the tongue daily.      . vitamin E 1000 UNIT capsule Take 1,000 Units by mouth daily.       No facility-administered medications prior to visit.   Allergies  Allergen Reactions  . Dexamethasone Itching  Itching  . Claritin [Loratadine]     Hot flashes  . Sulfa Antibiotics Other (See Comments)    GI distress-pain  . Prednisone Rash   Patient Active Problem List   Diagnosis Date Noted  . Anal cancer 08/19/2013  . Rectal mass 08/14/2013  . Anal lesion 08/14/2013  . Cancer 08/14/2013  . IBS (irritable bowel syndrome) 03/29/2013  . Chronic daily headache 03/29/2013  . 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 Smoking status: Current Every Day Smoker -- 1.00 packs/day    Types: Cigarettes  . Smokeless tobacco: Never Used  . Alcohol Use: No   family history includes Arthritis in her mother; Heart disease in her father and mother; Hyperlipidemia in her father and mother; Hypertension in her father and mother; Irritable bowel syndrome  in her mother; Lung cancer in her brother; Stroke (age of onset: 59) in her mother; Stroke (age of onset: 62) in her father; Thyroid disease in her father and mother.     Objective:   Physical Exam and and and a well-developed white female in no acute distress, pleasant blood pressure 138 or 82 pulse 92 height 5 foot 1 weight 114. HEENT; nontraumatic normocephalic EOMI PERRLA sclera anicteric, Supple ;no JVD, Cardiovascular; regular rate and rhythm with S1-S2 no murmur or gallop, Pulmonary; clear bilaterally, Abdomen; soft nontender nondistended bowel sounds are active no palpable mass or hepatosplenomegaly, Rectal ;exam small external skin tag, on digital exam she does have a firm tender nodular feeling area on the right side of the anus. Stool is brown and Hemoccult positive. Extremities ;no clubbing cyanosis or edema skin warm dry, Psych ;mood and affect appropriate.    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 Patient is status post chemotherapy and radiation which were completed November 2014. She presents now with recurrent anal rectal pain x6 weeks Digital exam confirms a tender firm nodular area on the right side of the anus, stool is seen positive-both concerning for recurrence.  #2 IBS constipation predominant  Plan; Will schedule for flexible sigmoidoscopy with sedation with Dr. Fuller Plan within the next week for biopsies. Procedure discussed in detail  with patient and she is agreeable to proceed. Patient would like to try Amitiza   for her constipation and was given a couple of weeks of Amitiza 8 micrograms twice a day as a trial. If she finds this helpful ,Will send a prescription.  Plan

## 2014-03-03 NOTE — Progress Notes (Signed)
Reviewed and agree with management plan.  Dariyah Garduno T. Meyah Corle, MD FACG 

## 2014-03-03 NOTE — Telephone Encounter (Signed)
Message copied by Norma Fredrickson on Mon Mar 03, 2014  4:07 PM ------      Message from: [place], [place] P      Created: Fri Feb 28, 2014 11:06 AM                   ----- Message -----         From: Ladell Pier, MD         Sent: 02/27/2014   8:49 PM           To: Tania Ade, RN, Ludwig Lean, RN, #            Please call patient. Potassium is norma, cont. Kcl, f/u as scheduled ------

## 2014-03-03 NOTE — Patient Instructions (Signed)
You have been scheduled for a flexible sigmoidoscopy. Please follow the written instructions given to you at your visit today. If you use inhalers (even only as needed), please bring them with you on the day of your procedure.  We have given you samples of Amitiza 8 mcg, take 1 capsule twice daily.  If these help call us and we will be glad to send a prescription.

## 2014-03-03 NOTE — Telephone Encounter (Signed)
Returned call to patient , she had thought we had called her, I don't see where we did in [place] and I didn't, she is at [facility]' office for her appt today, asked about her mother who is in the hospital"She has made a 180 turn  For the better stated Cindy",thanked me for asking  3:50 PM

## 2014-03-04 ENCOUNTER — Telehealth: Payer: Self-pay | Admitting: Family Medicine

## 2014-03-04 NOTE — Telephone Encounter (Signed)
Attempted to call pt to set up appointment.

## 2014-03-04 NOTE — Telephone Encounter (Signed)
Attempted to call pt to set up appointment.  

## 2014-03-05 NOTE — Telephone Encounter (Signed)
Left a message for pt to call office to make a follow up appt.

## 2014-03-06 NOTE — Telephone Encounter (Signed)
Called and spoke with pt and pt states she has had a lot going on and that is why she had not been able to come in to see Dr. Maudie Mercury.  Her mother was on life support last week.  The pt has to have a biopsy done and will go on Monday.  Advised pt to follow up before her medication runs out.  Pt states she will.

## 2014-03-10 ENCOUNTER — Ambulatory Visit (AMBULATORY_SURGERY_CENTER): Payer: No Typology Code available for payment source | Admitting: Gastroenterology

## 2014-03-10 ENCOUNTER — Encounter: Payer: Self-pay | Admitting: Gastroenterology

## 2014-03-10 VITALS — BP 138/46 | HR 73 | Temp 98.7°F | Resp 13 | Ht 61.75 in | Wt 114.0 lb

## 2014-03-10 DIAGNOSIS — K6289 Other specified diseases of anus and rectum: Secondary | ICD-10-CM

## 2014-03-10 DIAGNOSIS — K626 Ulcer of anus and rectum: Secondary | ICD-10-CM

## 2014-03-10 DIAGNOSIS — Z85048 Personal history of other malignant neoplasm of rectum, rectosigmoid junction, and anus: Secondary | ICD-10-CM

## 2014-03-10 DIAGNOSIS — C210 Malignant neoplasm of anus, unspecified: Secondary | ICD-10-CM

## 2014-03-10 MED ORDER — SODIUM CHLORIDE 0.9 % IV SOLN: 500.0000 mL | INTRAVENOUS | Status: DC

## 2014-03-10 NOTE — Op Note (Signed)
[place]  [address] [place], [address]   FLEXIBLE SIGMOIDOSCOPY PROCEDURE REPORT  PATIENT: Erin [name], Erin [name]  MR#: [id number] BIRTHDATE: November 26, 1955 , 70  yrs. old GENDER: Female ENDOSCOPIST: Ladene Artist, MD, [facility] PROCEDURE DATE:  03/10/2014 PROCEDURE:   Sigmoidoscopy with biopsy ASA CLASS:   Class II INDICATIONS: anal/rectal pain, history of anal squamous cell cancer MEDICATIONS: MAC sedation, administered by CRNA and propofol (Diprivan) 150mg  IV DESCRIPTION OF PROCEDURE:   After the risks benefits and alternatives of the procedure were thoroughly explained, informed consent was obtained.  revealed no abnormalities of the rectum. The LB [id number]  endoscope was introduced through the anus  and advanced to the descending colon , limited by No adverse events experienced.   The quality of the prep was good .  The instrument was then slowly withdrawn as the mucosa was fully examined.    1.  COLON FINDINGS: Distal rectal/anal canal nodularity measuing 2 cm x 2 cm. Scarring from prior treatment vs. recurrent disease. Located right and anteriorly.  Multiple biopsies obtained. 2.  The colonic mucosa appeared normal in the proximal rectum, sigmoid colon and descending colon.    Retroflexed views revealed no additional abnormalities.    The scope was then withdrawn from the patient and the procedure terminated.  COMPLICATIONS: There were no complications.  ENDOSCOPIC IMPRESSION: 1.   Distal rectal/anal canal nodularity; multiple biopsies obtained. 2.   The mucosa appeared normal in the proximal rectum, sigmoid colon and descending colon  RECOMMENDATIONS: 1.  await biopsy results  e]   eSigned:  Ladene Artist, MD, [facility] 03/10/2014 11:55 AM   EQ:[id number] Benay Spice, MD

## 2014-03-10 NOTE — Progress Notes (Signed)
A/ox3 pleased with MAC, report to Celia RN 

## 2014-03-10 NOTE — Patient Instructions (Signed)
Discharge instructions given with verbal understanding. Biopsies taken. Resume previous medications. YOU HAD AN ENDOSCOPIC PROCEDURE TODAY AT THE [facility]: Refer to the procedure report that was given to you for any specific questions about what was found during the examination.  If the procedure report does not answer your questions, please call your gastroenterologist to clarify.  If you requested that your care partner not be given the details of your procedure findings, then the procedure report has been included in a sealed envelope for you to review at your convenience later.  YOU SHOULD EXPECT: Some feelings of bloating in the abdomen. Passage of more gas than usual.  Walking can help get rid of the air that was put into your GI tract during the procedure and reduce the bloating. If you had a lower endoscopy (such as a colonoscopy or flexible sigmoidoscopy) you may notice spotting of blood in your stool or on the toilet paper. If you underwent a bowel prep for your procedure, then you may not have a normal bowel movement for a few days.  DIET: Your first meal following the procedure should be a light meal and then it is ok to progress to your normal diet.  A half-sandwich or bowl of soup is an example of a good first meal.  Heavy or fried foods are harder to digest and may make you feel nauseous or bloated.  Likewise meals heavy in dairy and vegetables can cause extra gas to form and this can also increase the bloating.  Drink plenty of fluids but you should avoid alcoholic beverages for 24 hours.  ACTIVITY: Your care partner should take you home directly after the procedure.  You should plan to take it easy, moving slowly for the rest of the day.  You can resume normal activity the day after the procedure however you should NOT DRIVE or use heavy machinery for 24 hours (because of the sedation medicines used during the test).    SYMPTOMS TO REPORT IMMEDIATELY: A gastroenterologist  can be reached at any hour.  During normal business hours, 8:30 AM to 5:00 PM Monday through Friday, call [phone].  After hours and on weekends, please call the GI answering service at [phone] who will take a message and have the physician on call contact you.   Following lower endoscopy (colonoscopy or flexible sigmoidoscopy):  Excessive amounts of blood in the stool  Significant tenderness or worsening of abdominal pains  Swelling of the abdomen that is new, acute  Fever of 100F or higher  FOLLOW UP: If any biopsies were taken you will be contacted by phone or by letter within the next 1-3 weeks.  Call your gastroenterologist if you have not heard about the biopsies in 3 weeks.  Our staff will call the home number listed on your records the next business day following your procedure to check on you and address any questions or concerns that you may have at that time regarding the information given to you following your procedure. This is a courtesy call and so if there is no answer at the home number and we have not heard from you through the emergency physician on call, we will assume that you have returned to your regular daily activities without incident.  SIGNATURES/CONFIDENTIALITY: You and/or your care partner have signed paperwork which will be entered into your electronic medical record.  These signatures attest to the fact that that the information above on your After Visit Summary has been reviewed   and is understood.  Full responsibility of the confidentiality of this discharge information lies with you and/or your care-partner.  

## 2014-03-10 NOTE — Progress Notes (Signed)
Called to room to assist during endoscopic procedure.  Patient ID and intended procedure confirmed with present staff. Received instructions for my participation in the procedure from the performing physician.  

## 2014-03-11 ENCOUNTER — Telehealth: Payer: Self-pay | Admitting: *Deleted

## 2014-03-11 NOTE — Telephone Encounter (Signed)
  Follow up Call-  Call back number 03/10/2014 08/05/2013  Post procedure Call Back phone  # [phone]  Permission to leave phone message Yes Yes     Patient questions:  Do you have a fever, pain , or abdominal swelling? no Pain Score  0 *  Have you tolerated food without any problems? yes  Have you been able to return to your normal activities? yes  Do you have any questions about your discharge instructions: Diet   no Medications  no Follow up visit  no  Do you have questions or concerns about your Care? no  Actions: * If pain score is 4 or above: No action needed, pain <4.

## 2014-03-17 ENCOUNTER — Telehealth: Payer: Self-pay | Admitting: Physician Assistant

## 2014-03-17 ENCOUNTER — Encounter: Payer: Self-pay | Admitting: Gastroenterology

## 2014-03-17 MED ORDER — LUBIPROSTONE 8 MCG PO CAPS: 8.0000 ug | ORAL_CAPSULE | Freq: Two times a day (BID) | ORAL | Status: DC

## 2014-03-17 NOTE — Telephone Encounter (Signed)
Patient had called and said the Amitiza 8 mcg twice daily worked well and wanted [place] to send prescription to [address] ).  When she saw Amy Esterwood PA-C in the office we told her to call us if she wanted a prescription.

## 2014-04-11 ENCOUNTER — Encounter: Payer: Self-pay | Admitting: Family Medicine

## 2014-04-11 ENCOUNTER — Ambulatory Visit (INDEPENDENT_AMBULATORY_CARE_PROVIDER_SITE_OTHER): Payer: No Typology Code available for payment source | Admitting: Family Medicine

## 2014-04-11 VITALS — BP 110/80 | HR 88 | Temp 98.6°F | Ht 61.75 in | Wt 110.5 lb

## 2014-04-11 DIAGNOSIS — F411 Generalized anxiety disorder: Secondary | ICD-10-CM | POA: Insufficient documentation

## 2014-04-11 DIAGNOSIS — E785 Hyperlipidemia, unspecified: Secondary | ICD-10-CM

## 2014-04-11 DIAGNOSIS — E119 Type 2 diabetes mellitus without complications: Secondary | ICD-10-CM

## 2014-04-11 DIAGNOSIS — R42 Dizziness and giddiness: Secondary | ICD-10-CM

## 2014-04-11 LAB — LIPID PANEL
Cholesterol: 288 mg/dL — ABNORMAL HIGH (ref 0–200)
HDL: 32 mg/dL — AB (ref >39.00)
LDL Cholesterol: 202 mg/dL — ABNORMAL HIGH (ref 0–99)
TRIGLYCERIDES: 268 mg/dL — AB (ref 0.0–149.0)
Total CHOL/HDL Ratio: 9
VLDL: 53.6 mg/dL — AB (ref 0.0–40.0)

## 2014-04-11 LAB — MICROALBUMIN / CREATININE URINE RATIO
Creatinine,U: 273.1 mg/dL
MICROALB/CREAT RATIO: 3 mg/g (ref 0.0–30.0)
Microalb, Ur: 8.2 mg/dL — ABNORMAL HIGH (ref 0.0–1.9)

## 2014-04-11 LAB — HEMOGLOBIN A1C: Hgb A1c MFr Bld: 5.4 % (ref 4.6–6.5)

## 2014-04-11 MED ORDER — ONDANSETRON 4 MG PO TBDP: 4.0000 mg | ORAL_TABLET | Freq: Three times a day (TID) | ORAL | Status: DC | PRN

## 2014-04-11 MED ORDER — PAROXETINE HCL 10 MG PO TABS: 20.0000 mg | ORAL_TABLET | Freq: Every day | ORAL | Status: DC

## 2014-04-11 MED ORDER — ROSUVASTATIN CALCIUM 20 MG PO TABS
ORAL_TABLET | ORAL | Status: DC
Start: 2014-04-11 — End: 2014-05-12

## 2014-04-11 MED ORDER — MECLIZINE HCL 25 MG PO TABS: 25.0000 mg | ORAL_TABLET | Freq: Three times a day (TID) | ORAL | Status: DC | PRN

## 2014-04-11 NOTE — Patient Instructions (Signed)
-  We have ordered labs or studies at this visit. It can take up to 1-2 weeks for results and processing. We will contact you with instructions IF your results are abnormal. Normal results will be released to your [facility]. If you have not heard from [place] or can not find your results in [facility] in 2 weeks please contact our office.  -PLEASE SIGN UP FOR MYCHART TODAY   We recommend the following healthy lifestyle measures: - eat a healthy diet consisting of lots of vegetables, fruits, beans, nuts, seeds, healthy meats such as white chicken and fish and whole grains.  - avoid fried foods, fast food, processed foods, sodas, red meet and other fattening foods.  - get a least 150 minutes of aerobic exercise per week.   -stop smoking - let us know how we can help  -start paroxetine daily -As we discussed, we have prescribed a new medication for you at this appointment. We discussed the common and serious potential adverse effects of this medication and you can review these and more with the pharmacist when you pick up your medication.  Please follow the instructions for use carefully and notify us immediately if you have any problems taking this medication.   Follow up in: 4-6 weeks

## 2014-04-11 NOTE — Progress Notes (Signed)
Pre visit review using our clinic review tool, if applicable. No additional management support is needed unless otherwise documented below in the visit note. 

## 2014-04-11 NOTE — Addendum Note (Signed)
Addended by: Agnes Lawrence on: 04/11/2014 05:04 PM   Modules accepted: Orders

## 2014-04-11 NOTE — Progress Notes (Signed)
No chief complaint on file.   HPI:  Follow up: Note: last seen 1 year ago and did not follow up as advised. However, unfortunately she has been dealing with a lot since with a new diagnosis of anal cancer, IBS treated by gastroenterologist and oncology.  DM: -new dx last year Lab Results  Component Value Date   HGBA1C 6.9* 07/30/2013   HLD: Lab Results  Component Value Date   CHOL 251* 07/30/2013   HDL 42.20 07/30/2013   LDLDIRECT 180.4 07/30/2013   TRIG 136.0 07/30/2013   CHOLHDL 6 07/30/2013  -advised tx in the past - she stopped statin because coulded tolerate with cancer treatment  Tobacco use: -offered help in the past -offered help again  Neurodermatitis: -on gabapentin and stable  Vertigo - chronic intermittent - evaluated in the past/Nausea with this: -for many years -uses meclizine and zofran prn and wants refill to help with this  Anxiety: -chronic worry about a lot -mom going through a lot -occ panic attacks in crowds -no depression, no thoughts of self harm, no manic symptoms  ROS: See pertinent positives and negatives per HPI.  Past Medical History  Diagnosis Date  . 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 Chronic daily headache 03/29/2013    takes bc powder  . IBS (irritable bowel syndrome) 03/29/2013  . Anxiety   . Depression   . HLD (hyperlipidemia)   . Allergy   . Blood transfusion without reported diagnosis     AS A CHILD  . Cancer 08/14/13    invasive squamous cell ca   . Tubular adenoma of colon 09/08/2003  . Chronic back pain   . Hot flashes   . Alopecia   . DM (diabetes mellitus)   . Hx of radiation therapy 09/09/13-10/16/13    anal canal 60.4Gy/75fx    Past Surgical History  Procedure Laterality Date  . Ectopic pregnancy surgery    . Pilonidal cyst excision    . Colonoscopy    . Flexible sigmoidoscopy N/A 08/14/2013    Procedure: FLEXIBLE SIGMOIDOSCOPY;  Surgeon: Ladene Artist, MD;  Location: [facility];  Service: Endoscopy;   Laterality: N/A;    Family History  Problem Relation Age of Onset  . Arthritis Mother   . Hyperlipidemia Mother   . Heart disease Mother   . Hypertension Mother   . Stroke Mother 47  . Heart disease Father   . Hyperlipidemia Father   . Hypertension Father   . Stroke Father 72  . Lung cancer Brother   . Irritable bowel syndrome Mother   . Thyroid disease Father   . Thyroid disease Mother     History   Social History  . Marital Status: Married    Spouse Name: N/A    Number of Children: 0  . Years of Education: N/A   Occupational History  . caregiver    Social History Main Topics  . Smoking status: Current Every Day Smoker -- 1.00 packs/day    Types: Cigarettes  . Smokeless tobacco: Never Used  . Alcohol Use: No  . Drug Use: No  . Sexual Activity: Yes    Partners: Male   Other Topics Concern  . None   Social History Narrative   Work or School: homemaker      Home Situation: lives with her husband,[name] takes care of her elderly parents       Spiritual Beliefs: Christian      Lifestyle: no regular exercise, poor diet  Current outpatient prescriptions:Biotin 10 MG TABS, Take 10 mg by mouth daily., Disp: , Rfl: ;  bisacodyl (DULCOLAX) 5 MG EC tablet, Take 15 mg by mouth daily as needed for constipation., Disp: , Rfl: ;  cimetidine (TAGAMET) 200 MG tablet, Take 200 mg by mouth as needed., Disp: , Rfl: ;  ferrous sulfate 325 (65 FE) MG tablet, Take 325 mg by mouth daily with breakfast., Disp: , Rfl:  fluticasone (FLONASE) 50 MCG/ACT nasal spray, Place 2 sprays into the nose daily., Disp: 16 g, Rfl: 6;  gabapentin (NEURONTIN) 600 MG tablet, TAKE 1 TABLET THREE TIMES A DAY, Disp: 180 tablet, Rfl: 0;  magnesium oxide (MAG-OX) 400 MG tablet, Take 400 mg by mouth daily., Disp: , Rfl: ;  meclizine (MEDI-MECLIZINE) 25 MG tablet, Take 1 tablet (25 mg total) by mouth 3 (three) times daily as needed for dizziness., Disp: 30 tablet, Rfl: 1 naproxen sodium  (ANAPROX) 220 MG tablet, Take 220 mg by mouth 2 (two) times daily with a meal., Disp: , Rfl: ;  potassium chloride SA (K-DUR,KLOR-CON) 20 MEQ tablet, Take 1 tablet (20 mEq total) by mouth daily., Disp: 90 tablet, Rfl: 4;  Probiotic Product (PROBIOTIC DAILY PO), Take by mouth daily. , Disp: , Rfl: ;  vitamin B-12 (CYANOCOBALAMIN) 1000 MCG tablet, Place 1,000 mcg under the tongue daily., Disp: , Rfl:  vitamin E 1000 UNIT capsule, Take 1,000 Units by mouth daily., Disp: , Rfl: ;  ondansetron (ZOFRAN ODT) 4 MG disintegrating tablet, Take 1 tablet (4 mg total) by mouth every 8 (eight) hours as needed for nausea or vomiting., Disp: 20 tablet, Rfl: 0;  PARoxetine (PAXIL) 10 MG tablet, Take 2 tablets (20 mg total) by mouth daily., Disp: 60 tablet, Rfl: 3  EXAM:  Filed Vitals:   04/11/14 1031  BP: 110/80  Pulse: 88  Temp: 98.6 F (37 C)    Body mass index is 20.39 kg/(m^2).  GENERAL: vitals reviewed and listed above, alert, oriented, appears well hydrated and in no acute distress  HEENT: atraumatic, conjunttiva clear, no obvious abnormalities on inspection of external nose and ears  NECK: no obvious masses on inspection  LUNGS: clear to auscultation bilaterally, no wheezes, rales or rhonchi, good air movement  CV: HRRR, no peripheral edema  MS: moves all extremities without noticeable abnormality  PSYCH: pleasant and cooperative, no obvious depression or anxiety  ASSESSMENT AND PLAN:  Discussed the following assessment and plan:  Hyperlipemia - Plan: Lipid Panel  Diet-controlled diabetes mellitus - Plan: Hemoglobin A1c, Microalbumin/Creatinine Ratio, Urine  GAD (generalized anxiety disorder) - Plan: PARoxetine (PAXIL) 10 MG tablet  Vertigo - Plan: meclizine (MEDI-MECLIZINE) 25 MG tablet, ondansetron (ZOFRAN ODT) 4 MG disintegrating tablet  -Patient advised to return or notify a doctor immediately if symptoms worsen or persist or new concerns arise.  Patient Instructions  -We have  ordered labs or studies at this visit. It can take up to 1-2 weeks for results and processing. We will contact you with instructions IF your results are abnormal. Normal results will be released to your [facility]. If you have not heard from [place] or can not find your results in [facility] in 2 weeks please contact our office.  -PLEASE SIGN UP FOR MYCHART TODAY   We recommend the following healthy lifestyle measures: - eat a healthy diet consisting of lots of vegetables, fruits, beans, nuts, seeds, healthy meats such as white chicken and fish and whole grains.  - avoid fried foods, fast food, processed foods, sodas, red meet and  other fattening foods.  - get a least 150 minutes of aerobic exercise per week.   -stop smoking - let us know how we can help  -start paroxetine daily -As we discussed, we have prescribed a new medication for you at this appointment. We discussed the common and serious potential adverse effects of this medication and you can review these and more with the pharmacist when you pick up your medication.  Please follow the instructions for use carefully and notify us immediately if you have any problems taking this medication.   Follow up in: 4-6 weeks      Erin [name]

## 2014-04-12 ENCOUNTER — Other Ambulatory Visit: Payer: Self-pay | Admitting: Family Medicine

## 2014-04-15 ENCOUNTER — Telehealth: Payer: Self-pay | Admitting: Family Medicine

## 2014-04-15 NOTE — Telephone Encounter (Signed)
Relevant patient education mailed to patient.  

## 2014-04-22 ENCOUNTER — Ambulatory Visit (HOSPITAL_BASED_OUTPATIENT_CLINIC_OR_DEPARTMENT_OTHER): Payer: No Typology Code available for payment source | Admitting: Nurse Practitioner

## 2014-04-22 ENCOUNTER — Other Ambulatory Visit: Payer: No Typology Code available for payment source

## 2014-04-22 ENCOUNTER — Other Ambulatory Visit (HOSPITAL_BASED_OUTPATIENT_CLINIC_OR_DEPARTMENT_OTHER): Payer: No Typology Code available for payment source

## 2014-04-22 ENCOUNTER — Ambulatory Visit: Payer: No Typology Code available for payment source | Admitting: Nurse Practitioner

## 2014-04-22 ENCOUNTER — Telehealth: Payer: Self-pay | Admitting: Oncology

## 2014-04-22 VITALS — BP 124/78 | HR 100 | Temp 97.2°F | Resp 18 | Ht 61.75 in | Wt 111.4 lb

## 2014-04-22 DIAGNOSIS — C210 Malignant neoplasm of anus, unspecified: Secondary | ICD-10-CM

## 2014-04-22 DIAGNOSIS — E538 Deficiency of other specified B group vitamins: Secondary | ICD-10-CM

## 2014-04-22 DIAGNOSIS — R198 Other specified symptoms and signs involving the digestive system and abdomen: Secondary | ICD-10-CM

## 2014-04-22 DIAGNOSIS — E876 Hypokalemia: Secondary | ICD-10-CM

## 2014-04-22 LAB — BASIC METABOLIC PANEL (CC13)
Anion Gap: 13 mEq/L — ABNORMAL HIGH (ref 3–11)
BUN: 8.8 mg/dL (ref 7.0–26.0)
CO2: 23 meq/L (ref 22–29)
Calcium: 9.1 mg/dL (ref 8.4–10.4)
Chloride: 103 mEq/L (ref 98–109)
Creatinine: 0.8 mg/dL (ref 0.6–1.1)
GLUCOSE: 81 mg/dL (ref 70–140)
POTASSIUM: 4.1 meq/L (ref 3.5–5.1)
Sodium: 139 mEq/L (ref 136–145)

## 2014-04-22 NOTE — Progress Notes (Addendum)
[place] OFFICE PROGRESS NOTE   Diagnosis:  Anal cancer.  INTERVAL HISTORY:   Erin [name] returns as scheduled. She reports recently developing "shooting pain" at the rectum. She felt a nodule. She underwent a sigmoidoscopy by Dr. Fuller Plan on 03/10/2014. Distal rectal/anal canal nodularity measuring 2 cm x 2 cm was noted. Biopsy showed benign ulcerated anorectal junction mucosa. There was inflammatory debris. Within the debris there were markedly atypical epithelial cell fragments and single cells.  She continues to have intermittent pain at the rectum. She denies bleeding. She has a bowel movement about every 3 days. She takes a laxative as needed. No shortness of breath or cough. She recently began Paxil. She has noted improvement in her energy level. Appetite has decreased since beginning Paxil.  Objective:  Vital signs in last 24 hours:  Blood pressure 124/78, pulse 100, temperature 97.2 F (36.2 C), temperature source Oral, resp. rate 18, height 5' 1.75" (1.568 m), weight 111 lb 6.4 oz (50.531 kg), SpO2 100.00%.    HEENT: No thrush or ulceration. Lymphatics: No palpable cervical, supraclavicular, axillary or inguinal lymph nodes. Resp: Lungs clear. Cardio: Regular cardiac rhythm. GI: Abdomen soft and nontender. No hepatomegaly. Vascular: No leg edema.  Skin: Radiation hyperpigmentation at the perineum. No skin breakdown. Small external hemorrhoid. Small, soft, mobile polypoid-like lesion right anterior anal verge.   Lab Results:  Lab Results  Component Value Date   WBC 6.1 11/28/2013   HGB 10.6* 11/28/2013   HCT 31.2* 11/28/2013   MCV 101.4* 11/28/2013   PLT 313 11/28/2013   NEUTROABS 4.0 11/28/2013    Imaging:  No results found.  Medications: I have reviewed the patient's current medications.  Assessment/Plan: 1. Squamous cell carcinoma of the anus, clinical stage II (T2 N0), status post endoscopic biopsy 08/14/2013 confirming invasive squamous cell  carcinoma. Initiation of radiation and cycle 1 5-FU/mitomycin C. 09/09/2013. Cycle 2 5-FU/mitomycin-C (dose reduction) completed beginning 10/08/2013, radiation completed 10/16/2013. 2. Rectal pain, constipation and bleeding secondary to #1. Resolved. 3. History of Iron deficiency anemia. 4. Vitamin B12 deficiency. 5. Diabetes. 6. Hyperlipidemia. 7. Neurodermatitis. 8. Tobacco use. She reports she has quit smoking. 9. Mucositis following cycle 1 and cycle 2 5-FU/mitomycin C.  10. Skin rash. Initially felt to be related to the chemotherapy though somewhat atypical in appearance and distribution. The rash has resolved. 11. 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  12. Hypokalemia. She is taking potassium. 13. Recent onset of intermittent rectal pain. Status post sigmoidoscopy 03/10/2014 with findings of distal rectal/anal canal nodularity measuring 2 cm x 2 cm. Biopsy showed benign ulcerated anorectal junction mucosa and inflammatory debris. Within the debris there were markedly atypical epithelial cell fragments and single cells.   Disposition: She remains in clinical remission from anal cancer. The area of irregularity at the right anal verge is likely a benign finding. We will continue to observe. She understands to contact the office with increased pain, bleeding or any change in the lesion.  She will followup with Dr. Lisbeth Renshaw in 2 months and return for a followup visit with [place] in 4 months.   Patient seen with Dr. Benay Spice. 25 minutes were spent face-to-face at today's visit with the majority of that time involved in counseling/coordination of care.    Owens Shark ANP/GNP-BC   04/22/2014  11:36 AM  This was a shared visit with [name]. She was  examined. There is irregularity at the right anterior anal verge with a  soft polypoid approximately 1/2 cm mobile lesion.  I have a low clinical suspicion for persistence/recurrence of anal  cancer, but given the pathology findings from the 03/10/2014 biopsy she will be followed closely.  Erin [name], M.D.

## 2014-04-22 NOTE — Telephone Encounter (Signed)
, °

## 2014-04-23 ENCOUNTER — Telehealth: Payer: Self-pay | Admitting: *Deleted

## 2014-04-23 NOTE — Telephone Encounter (Signed)
Message copied by Brien Few on Wed Apr 23, 2014  3:42 PM ------      Message from: Ladell Pier      Created: Tue Apr 22, 2014  5:59 PM       Please call patient, potassium is normal, is she still taking kcl and magnesium? ------

## 2014-04-23 NOTE — Telephone Encounter (Signed)
Called pt's home, spoke with husband- he reports she is still taking Potassium and Magnesium. Informed him potassium is normal.

## 2014-04-24 ENCOUNTER — Telehealth: Payer: Self-pay | Admitting: *Deleted

## 2014-04-24 ENCOUNTER — Other Ambulatory Visit: Payer: No Typology Code available for payment source

## 2014-04-24 ENCOUNTER — Ambulatory Visit: Payer: No Typology Code available for payment source | Admitting: Nurse Practitioner

## 2014-04-24 NOTE — Telephone Encounter (Signed)
Called pt, OK to stop potassium-- per Dr. Benay Spice. She voiced understanding and appreciation for call.

## 2014-04-28 ENCOUNTER — Ambulatory Visit
Admission: RE | Admit: 2014-04-28 | Payer: No Typology Code available for payment source | Source: Ambulatory Visit | Admitting: Radiation Oncology

## 2014-04-28 ENCOUNTER — Ambulatory Visit: Payer: No Typology Code available for payment source

## 2014-05-12 ENCOUNTER — Ambulatory Visit: Payer: No Typology Code available for payment source

## 2014-05-12 ENCOUNTER — Encounter: Payer: Self-pay | Admitting: Radiation Oncology

## 2014-05-12 ENCOUNTER — Ambulatory Visit
Admission: RE | Admit: 2014-05-12 | Discharge: 2014-05-12 | Disposition: A | Discharge status: Home / Self Care | Payer: No Typology Code available for payment source | Source: Ambulatory Visit | Attending: Radiation Oncology | Admitting: Radiation Oncology

## 2014-05-12 ENCOUNTER — Telehealth: Payer: Self-pay | Admitting: Family Medicine

## 2014-05-12 VITALS — BP 165/77 | HR 76 | Temp 98.2°F | Resp 20 | Wt 111.6 lb

## 2014-05-12 DIAGNOSIS — R51 Headache: Principal | ICD-10-CM

## 2014-05-12 DIAGNOSIS — C210 Malignant neoplasm of anus, unspecified: Secondary | ICD-10-CM

## 2014-05-12 DIAGNOSIS — R519 Headache, unspecified: Secondary | ICD-10-CM

## 2014-05-12 NOTE — Progress Notes (Signed)
Radiation Oncology         [phone] ________________________________  Name: Erin [name] MRN: [id number]  Date: 05/12/2014  DOB: [birth year]  Follow-Up Visit Note  CC: [name], DO  Ladell Pier, MD  Diagnosis:   Carcinoma of the anal canal  Interval Since Last Radiation:  Approximately 6 months   Narrative:  The patient returns today for routine follow-up.  The patient states that she has done well in terms of recovering further from her anal cancer treatment. The patient has discontinued the use of Colace and now has been using a natural supplement. She feels that this has been a good change which is felt to further normalize her bowel movements. She has some pressure prior to bowel movements but no discrete pain. No blood per rectum. The patient was noted to have some nodularity in the anal canal region on sigmoidoscopy and this was biopsied. No evidence of malignancy although some atypia was present.  The patient is complaining of some worsening headaches. She states that these headaches began prior to her treatment for anal cancer but haven't worsened. A neurology referral has been place by her primary care physician. The patient has been taking both Aleve and ibuprofen and I discussed with her the risk of taking too much of these medications and we discussed how these overlap. The patient's primary care physician has also discuss this with her.                              ALLERGIES:  is allergic to dexamethasone; claritin; sulfa antibiotics; and prednisone.  Meds: Current Outpatient Prescriptions  Medication Sig Dispense Refill  . Biotin 10 MG TABS Take 10 mg by mouth daily.      . cimetidine (TAGAMET) 200 MG tablet Take 200 mg by mouth as needed.      . fluticasone (FLONASE) 50 MCG/ACT nasal spray Place 2 sprays into the nose daily.  16 g  6  . gabapentin (NEURONTIN) 600 MG tablet TAKE 1 TABLET THREE TIMES A DAY (PATIENT NEEDS A FOLLOW UP VISIT FOR FUTURE REFILLS)   180 tablet  3  . meclizine (MEDI-MECLIZINE) 25 MG tablet Take 1 tablet (25 mg total) by mouth 3 (three) times daily as needed for dizziness.  30 tablet  1  . Multiple Vitamin (MULTIVITAMIN) tablet Take 1 tablet by mouth daily.      . naproxen sodium (ANAPROX) 220 MG tablet Take 220 mg by mouth 2 (two) times daily with a meal.      . ondansetron (ZOFRAN ODT) 4 MG disintegrating tablet Take 1 tablet (4 mg total) by mouth every 8 (eight) hours as needed for nausea or vomiting.  20 tablet  0  . PARoxetine (PAXIL) 10 MG tablet Take 2 tablets (20 mg total) by mouth daily.  60 tablet  3  . Probiotic Product (PROBIOTIC DAILY PO) Take by mouth daily.       . simvastatin (ZOCOR) 20 MG tablet Take 20 mg by mouth daily at 6 PM.       No current facility-administered medications for this encounter.    Physical Findings: The patient is in no acute distress. Patient is alert and oriented.  weight is 111 lb 9.6 oz (50.621 kg). Her oral temperature is 98.2 F (36.8 C). Her blood pressure is 165/77 and her pulse is 76. Her respiration is 20. Marland Kitchen   No inguinal lymphadenopathy present Radiation effect within  the anal region. The patient has an external hemorrhoid. On digital rectal exam, a firm nodule is present on the right within the anal canal. Some scarring/firmness present. No other worrisome findings and no blood on exam glove.  Lab Findings: Lab Results  Component Value Date   WBC 6.1 11/28/2013   HGB 10.6* 11/28/2013   HCT 31.2* 11/28/2013   MCV 101.4* 11/28/2013   PLT 313 11/28/2013     Radiographic Findings: No results found.  Impression:    The patient clinically is doing well and has recovered fairly well from her course of chemoradiation treatment for her anal cancer. She had some nodularity noted on sigmoidoscopy and this was biopsied. No sign of malignancy. She will need to continue to be clinically followed. She is scheduled to see medical oncology in several months.  Plan:  Followup appointment  in 6 months.   Jodelle Gross, M.D., Ph.D.

## 2014-05-12 NOTE — Telephone Encounter (Signed)
The pts cell number did not have voicemail available and I left a message with someone at the pts home number to return my call.

## 2014-05-12 NOTE — Telephone Encounter (Signed)
Pt states her headaches have gotten worse. Pt has daily headaches.  Pt states she is taking 12-16 ibuprofen /day and 6 aleve daily . Pt would like a referral to a neuro doc.

## 2014-05-12 NOTE — Progress Notes (Addendum)
Follow up anal rad txs last given 09/09/13-10/16/13, rectum slight discomfort now, main c/o  10/10 before leaving house,.she took a hydrocodone and now is a 4/10, had already had 8 Ibuoprofen today, says she takes 16 ibuprofen daily and takes aleve tabs 3-6 daily, sharp headache starts in back of head and moves to front temple,sharp pains, no hot flashes, takes paxil, regular bowel movements taking natural supplement advance colon care 1 bid instead of colace, says works with her probiotic, saw Dr.Kim today and he will set up a neurologist appt for her headaches \\2 :22 PM

## 2014-05-12 NOTE — Telephone Encounter (Signed)
I am sorry she is feeling so terrible - I was not aware and I don't believe she told me about this at her most recent visit. I advise she take no more then the recommended dose of the ibuprofen OR aleve (NOT BOTH AT [place]) as this is dangerous and could actually be worsening her headaches. Ok to place referral.

## 2014-05-13 NOTE — Telephone Encounter (Signed)
I left a message at the pts home number to return my call. 

## 2014-05-14 NOTE — Telephone Encounter (Signed)
I left a message with someone at the pts home number to return my call.

## 2014-05-15 NOTE — Telephone Encounter (Signed)
Pt aware of the message below and the order was entered for an urgent referral to neurology and she is aware someone will call her with an appt.

## 2014-06-02 ENCOUNTER — Encounter: Payer: Self-pay | Admitting: Neurology

## 2014-06-02 ENCOUNTER — Ambulatory Visit (INDEPENDENT_AMBULATORY_CARE_PROVIDER_SITE_OTHER): Payer: No Typology Code available for payment source | Admitting: Neurology

## 2014-06-02 VITALS — BP 122/70 | HR 115 | Ht 63.0 in | Wt 108.4 lb

## 2014-06-02 DIAGNOSIS — M542 Cervicalgia: Secondary | ICD-10-CM

## 2014-06-02 DIAGNOSIS — R51 Headache: Secondary | ICD-10-CM

## 2014-06-02 MED ORDER — NORTRIPTYLINE HCL 10 MG PO CAPS: ORAL_CAPSULE | ORAL | Status: DC
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
## 2014-06-02 NOTE — Patient Instructions (Signed)
1. MRI brain with and without contrast 2. MRI cervical spine without contrast 3. Start nortriptyline 10mg : Take 1 cap at bedtime for 1 week, then increase to 2 caps at bedtime for 1 week, then increase to 3 caps at bedtime 4. Start tapering off the Ibuprofen, goal is to take 2-3 A WEEK 5. Continue gabapentin 6. We will consider physical therapy if neck MRI shows arthritis changes

## 2014-06-09 ENCOUNTER — Telehealth: Payer: Self-pay | Admitting: Neurology

## 2014-06-09 NOTE — Telephone Encounter (Signed)
Pt needs to talk to someone about mri please call [phone]

## 2014-06-10 NOTE — Telephone Encounter (Signed)
Unable to reach patient or leave message will try again later.

## 2014-06-11 NOTE — Telephone Encounter (Signed)
Patient states that she is feeling a lot better and feels like she has improved a great deal. She does not want to have her Mri at this time. She would like to continue taking the medication you prescribed because it has helped. Please advise

## 2014-06-11 NOTE — Telephone Encounter (Signed)
Ok to hold off on MRI for now, however if symptoms recur or change, would proceed with test. Thanks

## 2014-06-11 NOTE — Telephone Encounter (Signed)
Patient notified

## 2014-06-13 ENCOUNTER — Ambulatory Visit (HOSPITAL_COMMUNITY): Payer: No Typology Code available for payment source

## 2014-06-24 ENCOUNTER — Other Ambulatory Visit: Payer: Self-pay | Admitting: Family Medicine

## 2014-06-24 NOTE — Telephone Encounter (Signed)
Please advise she check with her neurologist about the best way yo treat the vertigo. Thanks.

## 2014-06-25 ENCOUNTER — Telehealth: Payer: Self-pay | Admitting: Neurology

## 2014-06-25 DIAGNOSIS — R42 Dizziness and giddiness: Secondary | ICD-10-CM

## 2014-06-25 NOTE — Telephone Encounter (Signed)
Pt requesting Meclizine script for dizziness. Dr. Julianne Rice office told pt to contact us. Please call pt @ [phone] / Gayleen O[place].

## 2014-06-27 NOTE — Telephone Encounter (Signed)
Dr. Delice Lesch was seeing her for headaches.  Is the dizziness new?  If so, she should probably be evaluated first in the clinic.  Donika K. Posey Pronto, DO

## 2014-06-27 NOTE — Telephone Encounter (Signed)
Is this ok for me to send in for her

## 2014-06-30 MED ORDER — MECLIZINE HCL 25 MG PO TABS: 25.0000 mg | ORAL_TABLET | Freq: Three times a day (TID) | ORAL | Status: DC | PRN

## 2014-06-30 NOTE — Telephone Encounter (Signed)
If this is similar to her vertigo in the past, ok to Rx Meclizine 25mg  take 1 tab every 8 hours as needed for vertigo. Dispense #30. However if dizziness is different, pls scheduled f/u first. Thanks!

## 2014-06-30 NOTE — Telephone Encounter (Signed)
Please advise 

## 2014-06-30 NOTE — Addendum Note (Signed)
Addended by: Margarite Gouge M on: 06/30/2014 02:00 PM   Modules accepted: Orders

## 2014-06-30 NOTE — Telephone Encounter (Signed)
I spoke with patient. She states that the dizziness isn't new it's the same thing she had before. Rx for the meclizine was sent to her pharmacy.

## 2014-07-18 ENCOUNTER — Encounter: Payer: Self-pay | Admitting: Gastroenterology

## 2014-07-28 ENCOUNTER — Other Ambulatory Visit: Payer: Self-pay | Admitting: Neurology

## 2014-07-29 ENCOUNTER — Telehealth: Payer: Self-pay | Admitting: *Deleted

## 2014-07-29 ENCOUNTER — Encounter: Payer: Self-pay | Admitting: Gastroenterology

## 2014-07-29 NOTE — Telephone Encounter (Addendum)
Patient called requesting if she could see MD soon er than December, she is having increased difficulty with her right hip,using a walker now, increased joint pain , have made appt this Thursday f/u for 815am  10:48 AM

## 2014-07-31 ENCOUNTER — Telehealth: Payer: Self-pay | Admitting: *Deleted

## 2014-07-31 ENCOUNTER — Ambulatory Visit
Admission: RE | Admit: 2014-07-31 | Discharge: 2014-07-31 | Disposition: A | Discharge status: Home / Self Care | Payer: No Typology Code available for payment source | Source: Ambulatory Visit | Attending: Radiation Oncology | Admitting: Radiation Oncology

## 2014-07-31 ENCOUNTER — Encounter: Payer: Self-pay | Admitting: Radiation Oncology

## 2014-07-31 VITALS — BP 131/86 | HR 109 | Temp 97.6°F | Resp 20 | Ht 63.0 in | Wt 112.7 lb

## 2014-07-31 DIAGNOSIS — C210 Malignant neoplasm of anus, unspecified: Secondary | ICD-10-CM

## 2014-07-31 MED ORDER — HYDROCODONE-ACETAMINOPHEN 5-325 MG PO TABS: 1.0000 | ORAL_TABLET | Freq: Four times a day (QID) | ORAL | Status: DC | PRN

## 2014-07-31 MED ORDER — OXYCODONE-ACETAMINOPHEN 5-325 MG PO TABS: 1.0000 | ORAL_TABLET | ORAL | Status: DC | PRN

## 2014-07-31 NOTE — Telephone Encounter (Signed)
CALLED PATIENT TO INFORM OF CT FOR 10-27-14- ARRIVAL TIME - 9:45 AM @ [facility], SPOKE WITH PATIENT AND SHE IS AWARE OF THIS TEST

## 2014-07-31 NOTE — Progress Notes (Signed)
Radiation Oncology         [phone] ________________________________  Name: BRINLEE GAMBRELL MRN: [id number]  Date: 07/31/2014  DOB: [birth year]  Follow-Up Visit Note  CC: [name], DO  Ladell Pier, MD  Diagnosis:   Anal cancer  Interval Since Last Radiation:  10 months   Narrative:  The patient returns today for routine follow-up.  The patient states that she is having significant bilateral hip pain/leg pain. This is caused sustained some difficulty walking. The patient and her husband indicate that she experience a similar episode previously and this was prior to her radiation treatment. This resolved but has returned over the last month. She does request a refill on pain medication for this. The patient is having some constipation but has run out of her medication that she has been using and she will obtain this again. Minimal bleeding without any significant change regarding this.                              ALLERGIES:  is allergic to dexamethasone; claritin; sulfa antibiotics; and prednisone.  Meds: Current Outpatient Prescriptions  Medication Sig Dispense Refill  . cimetidine (TAGAMET) 200 MG tablet Take 200 mg by mouth as needed.      . fluticasone (FLONASE) 50 MCG/ACT nasal spray Place 2 sprays into the nose daily.  16 g  6  . gabapentin (NEURONTIN) 600 MG tablet TAKE 1 TABLET THREE TIMES A DAY (PATIENT NEEDS A FOLLOW UP VISIT FOR FUTURE REFILLS)  180 tablet  3  . meclizine (ANTIVERT) 25 MG tablet TAKE ONE TABLET BY MOUTH EVERY 8 HOURS AS NEEDED FOR  DIZZINESS  30 tablet  0  . Multiple Vitamin (MULTIVITAMIN) tablet Take 1 tablet by mouth daily.      . nortriptyline (PAMELOR) 10 MG capsule Take 1 cap at bedtime for 1 week, then increase to 2 caps at bedtime for 1 week, then increase to 3 caps at bedtime  90 capsule  3  . PARoxetine (PAXIL) 10 MG tablet Take 2 tablets (20 mg total) by mouth daily.  60 tablet  3  . pravastatin (PRAVACHOL) 20 MG tablet Take 20 mg by  mouth daily.      . Biotin 10 MG TABS Take 10 mg by mouth daily.      Marland Kitchen HYDROcodone-acetaminophen (NORCO/VICODIN) 5-325 MG per tablet Take 1 tablet by mouth every 6 (six) hours as needed for moderate pain.  40 tablet  0  . ondansetron (ZOFRAN ODT) 4 MG disintegrating tablet Take 1 tablet (4 mg total) by mouth every 8 (eight) hours as needed for nausea or vomiting.  20 tablet  0  . Probiotic Product (PROBIOTIC DAILY PO) Take by mouth daily.        No current facility-administered medications for this encounter.    Physical Findings: The patient is in no acute distress. Patient is alert and oriented.  height is 5\' 3"  (1.6 m) and weight is 112 lb 11.2 oz (51.12 kg). Her oral temperature is 97.6 F (36.4 C). Her blood pressure is 131/86 and her pulse is 109. Her respiration is 20. Marland Kitchen   No inguinal lymphadenopathy Rectal exam:  No suspicious external findings. Hemorrhoids present. Some fibrosis without any suspicious nodularity/masses on digital rectal exam. No blood.  Lab Findings: Lab Results  Component Value Date   WBC 6.1 11/28/2013   HGB 10.6* 11/28/2013   HCT 31.2* 11/28/2013   MCV  101.4* 11/28/2013   PLT 313 11/28/2013     Radiographic Findings: No results found.  Impression:    No signs of recurrence on exam today. She is having some eye lateral hip pain and this could have been worse since from radiation treatment. The patient is having substantial pain in the pelvic region with some difficulty with ambulation.  Given the difficulty with her walking today in clinic which was severe, I would like to obtain a CT scan of the abdomen and pelvis and have the patient return to clinic to rule out any signs of recurrence. If negative, the patient would likely benefit from physical therapy.  Plan:  CT scan of the abdomen and pelvis with followup in the near future.  I spent 25 minutes with the patient today, the majority of which was spent counseling the patient on the diagnosis of cancer and  coordinating care.   Jodelle Gross, M.D., Ph.D.

## 2014-07-31 NOTE — Progress Notes (Signed)
Follow up s/p radiation 09/09/13-10/16/13, here today for increased buttock/hip/leg pain, increased difficuklty walking 3-4 weeks now, heating pad not helping, appetite is poor, has constipated, takes advanced colon care 2, herbal supplement capsule, has nausea on occasion, more acid reflux now stated, pain in left leg today, yesterday right leg  8:23 AM

## 2014-07-31 NOTE — Addendum Note (Signed)
Encounter addended by: Marye Round, MD on: 07/31/2014 11:10 AM<BR>     Documentation filed: Flowsheet VN, Orders

## 2014-08-07 ENCOUNTER — Other Ambulatory Visit: Payer: Self-pay | Admitting: Family Medicine

## 2014-08-07 ENCOUNTER — Other Ambulatory Visit: Payer: Self-pay | Admitting: Neurology

## 2014-08-07 ENCOUNTER — Telehealth: Payer: Self-pay | Admitting: *Deleted

## 2014-08-07 NOTE — Telephone Encounter (Signed)
CALLED PATIENT TO INFORM THAT CT HAS BEEN MOVED TO 08-11-14 @ 7:30 AM @ [facility] AND HER FU VISIT WITH DR. MOODY TO 08-14-14 @ 4:45 PM, PATIENT WAS INFORMED TO PICK -UP CONTRAST TOMORROW (08-08-14) IN RADIOLOGY, SPOKE WITH PATIENT'S HUSBAND AND HE IS AWARE OF THESE APPTS.

## 2014-08-07 NOTE — Telephone Encounter (Signed)
Ok to refill, pls send #30 with 6 refills, thanks

## 2014-08-07 NOTE — Telephone Encounter (Signed)
Rx refill

## 2014-08-08 ENCOUNTER — Other Ambulatory Visit: Payer: Self-pay | Admitting: Family Medicine

## 2014-08-11 ENCOUNTER — Encounter (HOSPITAL_COMMUNITY): Payer: Self-pay

## 2014-08-11 ENCOUNTER — Ambulatory Visit (HOSPITAL_COMMUNITY)
Admission: RE | Admit: 2014-08-11 | Discharge: 2014-08-11 | Disposition: A | Discharge status: Home / Self Care | Payer: No Typology Code available for payment source | Source: Ambulatory Visit | Attending: Radiation Oncology | Admitting: Radiation Oncology

## 2014-08-11 DIAGNOSIS — I700 Atherosclerosis of aorta: Secondary | ICD-10-CM | POA: Insufficient documentation

## 2014-08-11 DIAGNOSIS — R935 Abnormal findings on diagnostic imaging of other abdominal regions, including retroperitoneum: Secondary | ICD-10-CM | POA: Insufficient documentation

## 2014-08-11 DIAGNOSIS — C20 Malignant neoplasm of rectum: Secondary | ICD-10-CM | POA: Insufficient documentation

## 2014-08-11 DIAGNOSIS — C210 Malignant neoplasm of anus, unspecified: Secondary | ICD-10-CM

## 2014-08-11 LAB — POCT I-STAT CREATININE: Creatinine, Ser: 0.8 mg/dL (ref 0.50–1.10)

## 2014-08-11 MED ORDER — IOHEXOL 300 MG/ML  SOLN
100.0000 mL | Freq: Once | INTRAMUSCULAR | Status: AC | PRN
  Administered 2014-08-11: 100 mL via INTRAVENOUS

## 2014-08-14 ENCOUNTER — Ambulatory Visit
Admission: RE | Admit: 2014-08-14 | Payer: No Typology Code available for payment source | Source: Ambulatory Visit | Admitting: Radiation Oncology

## 2014-08-14 ENCOUNTER — Telehealth: Payer: Self-pay | Admitting: *Deleted

## 2014-08-14 ENCOUNTER — Other Ambulatory Visit: Payer: Self-pay | Admitting: Radiation Oncology

## 2014-08-14 DIAGNOSIS — C210 Malignant neoplasm of anus, unspecified: Secondary | ICD-10-CM

## 2014-08-14 NOTE — Telephone Encounter (Signed)
CALLED PATIENT TO INFORM OF PT APPT. ON 08-19-14 - ARRIVAL TIME - 2:15 PM @ [facility], SPOKE WITH PATIENT AND SHE IS AWARE OF THIS APPT.

## 2014-08-14 NOTE — Telephone Encounter (Signed)
Received call from patient who states she is sick and cannot come in for FU appointment today. She states she is to receive results from a CT scan, and she is requesting that she be called with the results by either Dr Lisbeth Renshaw or Thayer Headings, RN. Informed Ms Ausley that this RN will route her request to Thayer Headings and Dr Lisbeth Renshaw who is returning to this office this afternoon. She verbalized understanding and appreciation.

## 2014-08-14 NOTE — Telephone Encounter (Signed)
Called Erin [name] per Dr.Moody, the Ct abd/pelvis :looks fine" he is ordering physical therapy for you,expect a call from them in a few days", Erin [name] has a bad cold and said tho thank Dr.Moody and me for quick return call And good results  3:28 PM

## 2014-08-18 ENCOUNTER — Other Ambulatory Visit: Payer: Self-pay

## 2014-08-18 ENCOUNTER — Telehealth: Payer: Self-pay | Admitting: Family Medicine

## 2014-08-18 DIAGNOSIS — Z1231 Encounter for screening mammogram for malignant neoplasm of breast: Secondary | ICD-10-CM

## 2014-08-18 NOTE — Telephone Encounter (Signed)
Pt seen in may for FU on meds. Pt states no refills. Does she need to come in for appt for her refills?  Pt would like to know does she need cpe? pls advise if appt needed and what type?

## 2014-08-18 NOTE — Telephone Encounter (Signed)
Pt has been sch

## 2014-08-18 NOTE — Telephone Encounter (Signed)
I called the pt and advised her she is overdue for her physical and lab work and I transferred her to a scheduler to make this appt.

## 2014-08-19 ENCOUNTER — Ambulatory Visit: Payer: No Typology Code available for payment source | Admitting: Physical Therapy

## 2014-08-22 ENCOUNTER — Ambulatory Visit: Payer: No Typology Code available for payment source

## 2014-08-25 ENCOUNTER — Ambulatory Visit: Payer: No Typology Code available for payment source | Attending: Radiation Oncology | Admitting: Physical Therapy

## 2014-08-25 ENCOUNTER — Other Ambulatory Visit: Payer: No Typology Code available for payment source

## 2014-08-25 ENCOUNTER — Telehealth: Payer: Self-pay | Admitting: *Deleted

## 2014-08-25 ENCOUNTER — Ambulatory Visit: Payer: No Typology Code available for payment source | Admitting: Physical Therapy

## 2014-08-25 ENCOUNTER — Ambulatory Visit: Payer: No Typology Code available for payment source | Admitting: Oncology

## 2014-08-25 DIAGNOSIS — Z5189 Encounter for other specified aftercare: Secondary | ICD-10-CM | POA: Diagnosis present

## 2014-08-25 DIAGNOSIS — M62830 Muscle spasm of back: Secondary | ICD-10-CM | POA: Insufficient documentation

## 2014-08-25 DIAGNOSIS — M25552 Pain in left hip: Secondary | ICD-10-CM | POA: Diagnosis not present

## 2014-08-25 DIAGNOSIS — C210 Malignant neoplasm of anus, unspecified: Secondary | ICD-10-CM | POA: Diagnosis not present

## 2014-08-25 DIAGNOSIS — Z9221 Personal history of antineoplastic chemotherapy: Secondary | ICD-10-CM | POA: Insufficient documentation

## 2014-08-25 DIAGNOSIS — Z923 Personal history of irradiation: Secondary | ICD-10-CM | POA: Diagnosis not present

## 2014-08-25 NOTE — Telephone Encounter (Signed)
Message from pt requesting to cancel today's appt. Unable to come in to office due to hip pain. Dr. Benay Spice made aware. Order sent to scheduler for 1 mo office visit. Pt reports she was having this pain when she saw Dr. Lisbeth Renshaw. (Began 1-2 mos ago.) Had CT to R/O metastatic disease. She is having PT now for this.

## 2014-08-26 ENCOUNTER — Telehealth: Payer: Self-pay | Admitting: Oncology

## 2014-08-26 ENCOUNTER — Ambulatory Visit: Payer: No Typology Code available for payment source | Admitting: Physical Therapy

## 2014-08-26 DIAGNOSIS — Z5189 Encounter for other specified aftercare: Secondary | ICD-10-CM | POA: Diagnosis not present

## 2014-08-26 NOTE — Telephone Encounter (Signed)
Pt cld to r/s due to hip pain, pt r/s due to hip pain and req Mon or Fri, pt req sch mailed out updated sch.... Cherylann Banas

## 2014-09-01 ENCOUNTER — Ambulatory Visit: Payer: No Typology Code available for payment source | Admitting: Physical Therapy

## 2014-09-05 ENCOUNTER — Ambulatory Visit: Payer: No Typology Code available for payment source | Admitting: Physical Therapy

## 2014-09-05 ENCOUNTER — Other Ambulatory Visit: Payer: Self-pay

## 2014-09-05 DIAGNOSIS — Z5189 Encounter for other specified aftercare: Secondary | ICD-10-CM | POA: Diagnosis not present

## 2014-09-08 ENCOUNTER — Other Ambulatory Visit: Payer: Self-pay | Admitting: Family Medicine

## 2014-09-08 ENCOUNTER — Ambulatory Visit: Payer: No Typology Code available for payment source | Admitting: Physical Therapy

## 2014-09-08 ENCOUNTER — Ambulatory Visit (AMBULATORY_SURGERY_CENTER): Payer: Self-pay | Admitting: *Deleted

## 2014-09-08 VITALS — Ht 63.0 in | Wt 112.4 lb

## 2014-09-08 DIAGNOSIS — Z5189 Encounter for other specified aftercare: Secondary | ICD-10-CM | POA: Diagnosis not present

## 2014-09-08 DIAGNOSIS — Z85048 Personal history of other malignant neoplasm of rectum, rectosigmoid junction, and anus: Secondary | ICD-10-CM

## 2014-09-08 NOTE — Progress Notes (Signed)
No egg or soy allergy. ewm No home 02. ewm no blood thinners. ewm No diet pills. ewm No problems with past sedation. ewm

## 2014-09-09 ENCOUNTER — Telehealth: Payer: Self-pay | Admitting: Neurology

## 2014-09-09 DIAGNOSIS — R51 Headache: Principal | ICD-10-CM

## 2014-09-09 DIAGNOSIS — R519 Headache, unspecified: Secondary | ICD-10-CM

## 2014-09-09 MED ORDER — MECLIZINE HCL 25 MG PO TABS: ORAL_TABLET | ORAL | Status: DC

## 2014-09-09 MED ORDER — NORTRIPTYLINE HCL 10 MG PO CAPS: ORAL_CAPSULE | ORAL | Status: DC

## 2014-09-09 NOTE — Telephone Encounter (Signed)
Pt needs to talk to someone about medication she is  Having headache still call back number is [phone]

## 2014-09-09 NOTE — Telephone Encounter (Signed)
Patient was notified. Rx's sent to [facility].

## 2014-09-09 NOTE — Telephone Encounter (Signed)
Ok to increase nortiptyline to 4 caps at bedtime, but if headaches continue, would proceed with the MRI brain that we had agreed to hold off on in the the past. Ok to refill meclizine, thanks

## 2014-09-09 NOTE — Telephone Encounter (Signed)
Spoke to pt. She states that about 2 weeks ago her headaches started coming back. She is taking nortriptyline 3 capsules qhs & meclizine prn for vertigo. She wants to know if she needs to increase the nortriptyline. She also states she needs new Rx for meclizine she has no more refills.   [facility].

## 2014-09-12 ENCOUNTER — Ambulatory Visit: Payer: No Typology Code available for payment source | Admitting: Physical Therapy

## 2014-09-12 ENCOUNTER — Ambulatory Visit (INDEPENDENT_AMBULATORY_CARE_PROVIDER_SITE_OTHER): Payer: PRIVATE HEALTH INSURANCE

## 2014-09-12 DIAGNOSIS — Z5189 Encounter for other specified aftercare: Secondary | ICD-10-CM | POA: Diagnosis not present

## 2014-09-12 DIAGNOSIS — Z23 Encounter for immunization: Secondary | ICD-10-CM

## 2014-09-15 ENCOUNTER — Ambulatory Visit: Payer: No Typology Code available for payment source | Admitting: Physical Therapy

## 2014-09-15 DIAGNOSIS — Z5189 Encounter for other specified aftercare: Secondary | ICD-10-CM | POA: Diagnosis not present

## 2014-09-19 ENCOUNTER — Ambulatory Visit: Payer: No Typology Code available for payment source | Admitting: Physical Therapy

## 2014-09-19 DIAGNOSIS — Z5189 Encounter for other specified aftercare: Secondary | ICD-10-CM | POA: Diagnosis not present

## 2014-09-22 ENCOUNTER — Ambulatory Visit: Payer: No Typology Code available for payment source | Attending: Radiation Oncology | Admitting: Physical Therapy

## 2014-09-22 DIAGNOSIS — Z923 Personal history of irradiation: Secondary | ICD-10-CM | POA: Diagnosis not present

## 2014-09-22 DIAGNOSIS — Z9221 Personal history of antineoplastic chemotherapy: Secondary | ICD-10-CM | POA: Insufficient documentation

## 2014-09-22 DIAGNOSIS — M62830 Muscle spasm of back: Secondary | ICD-10-CM | POA: Insufficient documentation

## 2014-09-22 DIAGNOSIS — C210 Malignant neoplasm of anus, unspecified: Secondary | ICD-10-CM | POA: Diagnosis not present

## 2014-09-22 DIAGNOSIS — Z5189 Encounter for other specified aftercare: Secondary | ICD-10-CM | POA: Diagnosis present

## 2014-09-22 DIAGNOSIS — M25552 Pain in left hip: Secondary | ICD-10-CM | POA: Insufficient documentation

## 2014-09-23 ENCOUNTER — Ambulatory Visit (AMBULATORY_SURGERY_CENTER): Payer: PRIVATE HEALTH INSURANCE | Admitting: Gastroenterology

## 2014-09-23 ENCOUNTER — Encounter: Payer: Self-pay | Admitting: Gastroenterology

## 2014-09-23 VITALS — BP 122/83 | HR 77 | Resp 19

## 2014-09-23 DIAGNOSIS — K623 Rectal prolapse: Secondary | ICD-10-CM

## 2014-09-23 DIAGNOSIS — R933 Abnormal findings on diagnostic imaging of other parts of digestive tract: Secondary | ICD-10-CM

## 2014-09-23 DIAGNOSIS — Z85048 Personal history of other malignant neoplasm of rectum, rectosigmoid junction, and anus: Secondary | ICD-10-CM

## 2014-09-23 MED ORDER — SODIUM CHLORIDE 0.9 % IV SOLN: 500.0000 mL | INTRAVENOUS | Status: DC

## 2014-09-23 NOTE — Progress Notes (Signed)
Called to room to assist during endoscopic procedure.  Patient ID and intended procedure confirmed with present staff. Received instructions for my participation in the procedure from the performing physician.  

## 2014-09-23 NOTE — Progress Notes (Signed)
Patient awakening,vss,report to rn 

## 2014-09-23 NOTE — Patient Instructions (Signed)

## 2014-09-23 NOTE — Op Note (Signed)
[place]  [address] [place], [id number]   FLEXIBLE SIGMOIDOSCOPY PROCEDURE REPORT  PATIENT: [name], Erin  MR#: [id number] BIRTHDATE: 03/15/1956 , 75  yrs. old GENDER: female ENDOSCOPIST: Ladene Artist, MD, [facility] PROCEDURE DATE:  09/23/2014 PROCEDURE:   Sigmoidoscopy with biopsy ASA CLASS:   Class III INDICATIONS: high risk: personal history of anal cancer.   an abnormal imaging results. MEDICATIONS: Monitored anesthesia care and Propofol 150 mg IV DESCRIPTION OF PROCEDURE:   After the risks benefits and alternatives of the procedure were thoroughly explained, informed consent was obtained.  Digital exam revealed no abnormalities of the rectum. The LB [id number]  endoscope was introduced through the anus  and advanced to the sigmoid colon , The exam was Without limitations.    The quality of the prep was The overall prep quality was adequate. .  The instrument was then slowly withdrawn as the mucosa was fully examined.    COLON FINDINGS: An area of abnormal mucosa was found in the rectum measuring 2 x 2 cm encompassing about 25 % of the circumference of the distal rectum/anal canal at the dentate line.  The mucosa was friable, erythematous, prominent vascular pattern and had scarring. This area corresponded to the prior site of anal cancer.  Multiple biopsies of the area were performed.   The colon mucosa was otherwise normal in the rectum and sigmoid colon.    Retroflexion was not performed due to a narrow rectal vault.    The scope was then withdrawn from the patient and the procedure terminated.  COMPLICATIONS: There were no immediate complications.  ENDOSCOPIC IMPRESSION: 1.   Area of abnormal mucosa in the distal rectum/anal cancal-friable, erythematous, scarring; multiple biopsies performed  2.   The rectum and sigmoid were otherwise normal  RECOMMENDATIONS: 1.  Await pathology results 2.  Flex sigmoidoscopy in 1 year  eSigned:   Ladene Artist, MD, [facility] 09/23/2014 11:11 AM

## 2014-09-24 ENCOUNTER — Telehealth: Payer: Self-pay | Admitting: *Deleted

## 2014-09-24 NOTE — Telephone Encounter (Signed)
  Follow up Call-  Call back number 09/23/2014 03/10/2014 08/05/2013  Post procedure Call Back phone  # [phone]  Permission to leave phone message Yes Yes Yes     Patient questions:  Do you have a fever, pain , or abdominal swelling? No. Pain Score  0 *  Have you tolerated food without any problems? Yes.    Have you been able to return to your normal activities? Yes.    Do you have any questions about your discharge instructions: Diet   No. Medications  No. Follow up visit  No.  Do you have questions or concerns about your Care? No.  Actions: * If pain score is 4 or above: No action needed, pain <4.  Information provided via husband, pt. Still sleep.

## 2014-09-26 ENCOUNTER — Ambulatory Visit: Payer: No Typology Code available for payment source | Admitting: Physical Therapy

## 2014-09-26 DIAGNOSIS — Z5189 Encounter for other specified aftercare: Secondary | ICD-10-CM | POA: Diagnosis not present

## 2014-09-29 ENCOUNTER — Ambulatory Visit: Payer: No Typology Code available for payment source | Admitting: Physical Therapy

## 2014-09-29 ENCOUNTER — Ambulatory Visit (HOSPITAL_BASED_OUTPATIENT_CLINIC_OR_DEPARTMENT_OTHER): Payer: PRIVATE HEALTH INSURANCE | Admitting: Nurse Practitioner

## 2014-09-29 ENCOUNTER — Other Ambulatory Visit: Payer: PRIVATE HEALTH INSURANCE

## 2014-09-29 VITALS — BP 121/84 | HR 85 | Temp 97.7°F | Resp 18 | Ht 63.0 in | Wt 111.5 lb

## 2014-09-29 DIAGNOSIS — Z5189 Encounter for other specified aftercare: Secondary | ICD-10-CM | POA: Diagnosis not present

## 2014-09-29 DIAGNOSIS — Z720 Tobacco use: Secondary | ICD-10-CM

## 2014-09-29 DIAGNOSIS — E538 Deficiency of other specified B group vitamins: Secondary | ICD-10-CM

## 2014-09-29 DIAGNOSIS — M25559 Pain in unspecified hip: Secondary | ICD-10-CM

## 2014-09-29 DIAGNOSIS — M25551 Pain in right hip: Secondary | ICD-10-CM

## 2014-09-29 DIAGNOSIS — R21 Rash and other nonspecific skin eruption: Secondary | ICD-10-CM

## 2014-09-29 DIAGNOSIS — M25552 Pain in left hip: Secondary | ICD-10-CM

## 2014-09-29 DIAGNOSIS — Z85048 Personal history of other malignant neoplasm of rectum, rectosigmoid junction, and anus: Secondary | ICD-10-CM

## 2014-09-29 DIAGNOSIS — C210 Malignant neoplasm of anus, unspecified: Secondary | ICD-10-CM

## 2014-09-29 NOTE — Progress Notes (Addendum)
[facility] OFFICE PROGRESS NOTE   Diagnosis:  Anal cancer  INTERVAL HISTORY:    Erin [name] returns for scheduled followup. No change in bowel habits. She estimates one bowel movement per week. No pain with bowel movements. No bleeding except a small amount following a recent sigmoidoscopy. She denies nausea/vomiting. Stable appetite. She reports a several month history of bilateral hip pain left greater than right. She reports having CT scans done. She is currently participating in physical therapy. There has been some improvement in the pain since beginning physical therapy. She takes hydrocodone as needed.  Objective:  Vital signs in last 24 hours:  Blood pressure 121/84, pulse 85, temperature 97.7 F (36.5 C), temperature source Oral, resp. rate 18, height 5\' 3"  (1.6 m), weight 111 lb 8 oz (50.576 kg).    HEENT: no thrush or ulcers. Lymphatics: no palpable cervical, supraclavicular, axillary or inguinal lymph nodes. Resp: lungs clear bilaterally. Cardio: regular rate and rhythm. GI: abdomen soft and nontender. No organomegaly. Vascular: no leg edema.  Rectal: radiation skin change at the perineum. No skin breakdown. Nodularity. Small external hemorrhoid. Digital rectal exam deferred due to recent sigmoidoscopy. MSK: no significant pain with internal/external rotation of the bilateral hips.   Lab Results:  Lab Results  Component Value Date   WBC 6.1 11/28/2013   HGB 10.6* 11/28/2013   HCT 31.2* 11/28/2013   MCV 101.4* 11/28/2013   PLT 313 11/28/2013   NEUTROABS 4.0 11/28/2013    Imaging:  No results found.  Medications: I have reviewed the patient's current medications.  Assessment/Plan: 1. Squamous cell carcinoma of the anus, clinical stage II (T2 N0), status post endoscopic biopsy 08/14/2013 confirming invasive squamous cell carcinoma. Initiation of radiation and cycle 1 5-FU/mitomycin C. 09/09/2013. Cycle 2 5-FU/mitomycin-C (dose reduction)  completed beginning 10/08/2013, radiation completed 10/16/2013. 2. Rectal pain, constipation and bleeding secondary to #1. Resolved. 3. History of Iron deficiency anemia. 4. Vitamin B12 deficiency. 5. Diabetes. 6. Hyperlipidemia. 7. Neurodermatitis. 8. Tobacco use. She reports she has quit smoking. 9. Mucositis following cycle 1 and cycle 2 5-FU/mitomycin C.  10. Skin rash. Initially felt to be related to the chemotherapy though somewhat atypical in appearance and distribution. The rash has resolved. 11. 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  12. History of hypokalemia. 13. Status post sigmoidoscopy 03/10/2014 with findings of distal rectal/anal canal nodularity measuring 2 cm x 2 cm. Biopsy showed benign ulcerated anorectal junction mucosa and inflammatory debris. Within the debris there were markedly atypical epithelial cell fragments and single cells. 14. Status post sigmoidoscopy 09/23/2014. Area of abnormal mucosa in the distal rectum/anal canal-friable, erythematous, scarring; multiple biopsies performed. Pathology pending. 15. Bilateral hip pain. Status post CT abdomen/pelvis 08/11/2014 with no etiology for the pain identified.   Disposition: Erin [name] remains in clinical remission from anal cancer. We will followup on the pathology from the recent sigmoidoscopy.  The etiology of the bilateral hip pain is unclear. We made a referral to orthopedics.  She will return for a followup visit in 6 months. She will contact the office in the interim with any problems.  Patient seen with Dr. Benay Spice. 25 minutes were spent face-to-face at today's visit with the majority of that time involving counseling/coordination of care.  Erin [name] ANP/GNP-BC   09/29/2014  12:17 PM This was a shared visit with Erin [name].  Erin [name] is in clinical remission from anal cancer.  We will f/u on the pathology from the  recent rectal biopsy. I  doubt the hip pain is related to anal cancer or chemo/xrt. We will refer her to orthopedics.  Erin Manson, MD

## 2014-10-01 ENCOUNTER — Encounter: Payer: Self-pay | Admitting: Gastroenterology

## 2014-10-03 ENCOUNTER — Ambulatory Visit: Payer: No Typology Code available for payment source | Admitting: Physical Therapy

## 2014-10-13 ENCOUNTER — Telehealth: Payer: Self-pay | Admitting: Family Medicine

## 2014-10-13 ENCOUNTER — Other Ambulatory Visit: Payer: Self-pay | Admitting: *Deleted

## 2014-10-13 MED ORDER — PAROXETINE HCL 20 MG PO TABS: 20.0000 mg | ORAL_TABLET | Freq: Every day | ORAL | Status: DC

## 2014-10-13 NOTE — Telephone Encounter (Signed)
Pt needs  Refill on generic paxil 10 mg twice a day. #60. Pt would like to know if paxil comes in 20 mg if so she would like #30 sent to [facility] [address].

## 2014-10-13 NOTE — Telephone Encounter (Signed)
Per Dr [name] change the Rx to Paxil 20mg  #90 with 2 refills and this was sent to the pts pharmacy.  I called the pt and there was no answer at her cell or home number.

## 2014-10-20 ENCOUNTER — Other Ambulatory Visit: Payer: Self-pay | Admitting: Family Medicine

## 2014-10-20 ENCOUNTER — Telehealth: Payer: Self-pay | Admitting: Oncology

## 2014-10-20 ENCOUNTER — Other Ambulatory Visit: Payer: Self-pay | Admitting: Neurology

## 2014-10-20 MED ORDER — MECLIZINE HCL 25 MG PO TABS: ORAL_TABLET | ORAL | Status: DC

## 2014-10-20 NOTE — Telephone Encounter (Signed)
Tiff, is she taking it 3x/day? Last filled last month #90 with 1 refill. Ok to send in 3 refills, but ask her to minimize use as much as she can. Thanks

## 2014-10-20 NOTE — Telephone Encounter (Signed)
, °

## 2014-10-21 ENCOUNTER — Telehealth: Payer: Self-pay | Admitting: Oncology

## 2014-10-21 NOTE — Telephone Encounter (Signed)
, °

## 2014-10-22 ENCOUNTER — Other Ambulatory Visit: Payer: Self-pay | Admitting: Family Medicine

## 2014-10-22 NOTE — Telephone Encounter (Signed)
Dr Kim-Dr [name] approved the Paxil and this was sent on 11/23-is the refill for Meclizine approved?

## 2014-10-22 NOTE — Telephone Encounter (Signed)
I believe her neurologist has been prescribing the meclizine ok to rx #90, 0 refills, but advise if persisted symptoms to contact her neurologist.

## 2014-10-27 ENCOUNTER — Ambulatory Visit (INDEPENDENT_AMBULATORY_CARE_PROVIDER_SITE_OTHER): Payer: PRIVATE HEALTH INSURANCE | Admitting: Family Medicine

## 2014-10-27 ENCOUNTER — Encounter: Payer: Self-pay | Admitting: Family Medicine

## 2014-10-27 ENCOUNTER — Ambulatory Visit (HOSPITAL_COMMUNITY): Payer: No Typology Code available for payment source

## 2014-10-27 VITALS — BP 100/64 | HR 84 | Temp 98.1°F | Ht 63.5 in | Wt 113.8 lb

## 2014-10-27 DIAGNOSIS — F411 Generalized anxiety disorder: Secondary | ICD-10-CM

## 2014-10-27 DIAGNOSIS — Z0000 Encounter for general adult medical examination without abnormal findings: Secondary | ICD-10-CM

## 2014-10-27 DIAGNOSIS — F17200 Nicotine dependence, unspecified, uncomplicated: Secondary | ICD-10-CM

## 2014-10-27 DIAGNOSIS — E785 Hyperlipidemia, unspecified: Secondary | ICD-10-CM

## 2014-10-27 DIAGNOSIS — Z23 Encounter for immunization: Secondary | ICD-10-CM

## 2014-10-27 DIAGNOSIS — B001 Herpesviral vesicular dermatitis: Secondary | ICD-10-CM

## 2014-10-27 DIAGNOSIS — E119 Type 2 diabetes mellitus without complications: Secondary | ICD-10-CM

## 2014-10-27 DIAGNOSIS — C210 Malignant neoplasm of anus, unspecified: Secondary | ICD-10-CM

## 2014-10-27 DIAGNOSIS — Z720 Tobacco use: Secondary | ICD-10-CM

## 2014-10-27 LAB — BASIC METABOLIC PANEL
BUN: 5 mg/dL — ABNORMAL LOW (ref 6–23)
CO2: 30 mEq/L (ref 19–32)
Calcium: 9 mg/dL (ref 8.4–10.5)
Chloride: 104 mEq/L (ref 96–112)
Creatinine, Ser: 0.7 mg/dL (ref 0.4–1.2)
GFR: 89.81 mL/min (ref >60.00)
GLUCOSE: 78 mg/dL (ref 70–99)
POTASSIUM: 3.7 meq/L (ref 3.5–5.1)
Sodium: 141 mEq/L (ref 135–145)

## 2014-10-27 LAB — LIPID PANEL
CHOLESTEROL: 295 mg/dL — AB (ref 0–200)
HDL: 35 mg/dL — AB (ref >39.00)
NonHDL: 260
Total CHOL/HDL Ratio: 8
Triglycerides: 252 mg/dL — ABNORMAL HIGH (ref 0.0–149.0)
VLDL: 50.4 mg/dL — ABNORMAL HIGH (ref 0.0–40.0)

## 2014-10-27 LAB — HEMOGLOBIN A1C: Hgb A1c MFr Bld: 5.8 % (ref 4.6–6.5)

## 2014-10-27 LAB — LDL CHOLESTEROL, DIRECT: Direct LDL: 199.3 mg/dL

## 2014-10-27 MED ORDER — ACYCLOVIR 400 MG PO TABS: 400.0000 mg | ORAL_TABLET | Freq: Three times a day (TID) | ORAL | Status: DC

## 2014-10-27 NOTE — Progress Notes (Signed)
Pre visit review using our clinic review tool, if applicable. No additional management support is needed unless otherwise documented below in the visit note. 

## 2014-10-27 NOTE — Progress Notes (Signed)
HPI:  Here for CPE:  -Concerns and/or follow up today:  Erin [name] is a very pleasant 58 yo F whom unfortunately has been dealing with MMP.  Recurrent cold sores: - wants acyclovir as has used - cold sore just started  DM/HLD: -diet controlled diabetes, takes pravastatin -last eye exam: never -foot exam:  Doing today -diet and exercise: doing regular exercise and trying to eat healthy -denies: polyuria, polydipsia, vision changes, wounds on feet, hypoglycemia  Tobacco Use: -reports: stopped smoking 1 month ago, on chantix -denies: SOB, DOE, chronic cough, hemoptysis, lung ca screening as had CT scan this year of chest  Anal Ca: -in remission, followed by onc -doing well  Anxiety: -meds: paxil 20mg  -reports: doing well -denies: worsening, panic, SI, thoughts of self harm  Vertigo/Headaches: -seen by neurologist, has OV Friday -meds:meclizine  IBS/GERD: -followed by GI  Neurodermatitis: -meds: gabapentin -stable, no worsening of picking or itching  -Diet: variety of foods, balance and well rounded -Exercise: no regular exercise  -Taking folic acid, vitamin D or calcium: no  -Diabetes and Dyslipidemia Screening:  -Hx of HTN: no  -Vaccines: needs pneumococcal  -pap history: last year normal  -FDLMP: n/a  -sexual activity: yes, female partner, no new partners  -wants STI testing: no  -FH breast, colon or ovarian ca: see FH Last mammogram: she has set this up Last colon cancer screening: had this, sees GI and onc  Breast Ca Risk Assessment: -no family or personal hx breast or ovarian ca  -Alcohol, Tobacco, drug use: see social history  Review of Systems - no fevers, unintentional weight loss, vision loss, hearing loss, chest pain, sob, hemoptysis, melena, hematochezia, hematuria, genital discharge, changing or concerning skin lesions, bleeding, bruising, loc, thoughts of self harm or SI  Past Medical History  Diagnosis Date  .  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 Chronic daily headache 03/29/2013    takes bc powder  . IBS (irritable bowel syndrome) 03/29/2013  . Anxiety   . Depression   . HLD (hyperlipidemia)   . Allergy   . Blood transfusion without reported diagnosis     AS A CHILD  . Tubular adenoma of colon 09/08/2003  . Chronic back pain   . Hot flashes   . Alopecia   . Hx of radiation therapy 09/09/13-10/16/13    anal canal 60.4Gy/56fx  . Cancer 08/14/13    invasive squamous cell ca   . DM (diabetes mellitus)     diet controlled  . History of chemotherapy     hx anal cancer  . GERD (gastroesophageal reflux disease)     Past Surgical History  Procedure Laterality Date  . Ectopic pregnancy surgery    . Pilonidal cyst excision    . Colonoscopy    . Flexible sigmoidoscopy N/A 08/14/2013    Procedure: FLEXIBLE SIGMOIDOSCOPY;  Surgeon: Ladene Artist, MD;  Location: [facility];  Service: Endoscopy;  Laterality: N/A;  . Polypectomy    . Tonsillectomy      Family History  Problem Relation Age of Onset  . Arthritis Mother   . Hyperlipidemia Mother   . Heart disease Mother   . Hypertension Mother   . Stroke Mother 67  . Irritable bowel syndrome Mother   . Thyroid disease Mother   . Heart disease Father   . Hyperlipidemia Father   . Hypertension Father   . Stroke Father 51  . Thyroid disease Father   . Prostate cancer Father   . Lung  cancer Brother   . Colon cancer Neg Hx   . Rectal cancer Neg Hx   . Stomach cancer Neg Hx     History   Social History  . Marital Status: Married    Spouse Name: N/A    Number of Children: 0  . Years of Education: N/A   Occupational History  . caregiver    Social History Main Topics  . Smoking status: Current Every Day Smoker -- 0.50 packs/day    Types: Cigarettes  . Smokeless tobacco: Never Used  . Alcohol Use: No  . Drug Use: No  . Sexual Activity:    Partners: Male   Other Topics Concern  . None   Social History Narrative    Work or School: homemaker      Home Situation: lives with her husband,Erin [name] takes care of her elderly parents       Spiritual Beliefs: Christian      Lifestyle: no regular exercise, poor diet                Current outpatient prescriptions: Biotin 10 MG TABS, Take 10 mg by mouth daily., Disp: , Rfl: ;  cimetidine (TAGAMET) 200 MG tablet, Take 200 mg by mouth as needed., Disp: , Rfl: ;  fluticasone (FLONASE) 50 MCG/ACT nasal spray, Place 2 sprays into the nose daily., Disp: 16 g, Rfl: 6;  gabapentin (NEURONTIN) 600 MG tablet, TAKE 1 TABLET THREE TIMES A DAY (PATIENT NEEDS A FOLLOW UP VISIT FOR FUTURE REFILLS), Disp: 180 tablet, Rfl: 3 meclizine (ANTIVERT) 25 MG tablet, TAKE ONE TABLET BY MOUTH EVERY 8 HOURS AS NEEDED FOR DIZZINESS, Disp: 90 tablet, Rfl: 3;  Multiple Vitamin (MULTIVITAMIN) tablet, Take 1 tablet by mouth daily., Disp: , Rfl: ;  nortriptyline (PAMELOR) 10 MG capsule, Take 4 capsules at bedtime, Disp: 360 capsule, Rfl: 0 ondansetron (ZOFRAN ODT) 4 MG disintegrating tablet, Take 1 tablet (4 mg total) by mouth every 8 (eight) hours as needed for nausea or vomiting., Disp: 20 tablet, Rfl: 0;  OVER THE COUNTER MEDICATION, Take 1 capsule by mouth 2 (two) times daily before a meal. Advance colon care II, Disp: , Rfl: ;  PARoxetine (PAXIL) 20 MG tablet, Take 1 tablet (20 mg total) by mouth daily., Disp: 90 tablet, Rfl: 2 pravastatin (PRAVACHOL) 20 MG tablet, Take 20 mg by mouth daily., Disp: , Rfl: ;  acyclovir (ZOVIRAX) 400 MG tablet, Take 1 tablet (400 mg total) by mouth 3 (three) times daily., Disp: 15 tablet, Rfl: 0  EXAM:  Filed Vitals:   10/27/14 0943  BP: 100/64  Pulse: 84  Temp: 98.1 F (36.7 C)    GENERAL: vitals reviewed and listed below, alert, oriented, appears well hydrated and in no acute distress  HEENT: head atraumatic, PERRLA, normal appearance of eyes, ears, nose and mouth. moist mucus membranes.  NECK: supple, no masses or lymphadenopathy  LUNGS: clear to  auscultation bilaterally, no rales, rhonchi or wheeze  CV: HRRR, no peripheral edema or cyanosis, normal pedal pulses  BREAST: declined  ABDOMEN: bowel sounds normal, soft, non tender to palpation, no masses, no rebound or guarding  GU: declined  RECTAL: refused  SKIN: no rash or abnormal lesions  MS: normal gait, moves all extremities normally  NEURO: CN II-XII grossly intact, normal muscle strength and sensation to light touch on extremities  PSYCH: normal affect, pleasant and cooperative  ASSESSMENT AND PLAN:  Discussed the following assessment and plan:  Cold sore - Plan: acyclovir (ZOVIRAX) 400 MG tablet  Visit for preventive health examination  Diet-controlled diabetes mellitus - Plan: Hemoglobin J1P, Basic metabolic panel  GAD (generalized anxiety disorder)  Anal cancer  Tobacco use disorder  Hyperlipemia - Plan: Lipid Panel  -Discussed and advised all [place] preventive services health task force level A and B recommendations for age, sex and risks.  -Advised at least 150 minutes of exercise per week and a healthy diet low in saturated fats and sweets and consisting of fresh fruits and vegetables, lean meats such as fish and white chicken and whole grains.  -FASTING labs, studies and vaccines per orders this encounter  Orders Placed This Encounter  Procedures  . Lipid Panel  . Hemoglobin A1c  . Basic metabolic panel    Patient advised to return to clinic immediately if symptoms worsen or persist or new concerns.  Patient Instructions  BEFORE YOU LEAVE: -pneumococcal vaccine -labs -follow up in 4 months  -schedule eye exam  -schedule mammogram  -Vit D3 [phone] IU daily, adequate calcium in diet (1200mg )  We recommend the following healthy lifestyle measures: - eat a healthy diet consisting of lots of vegetables, fruits, beans, nuts, seeds, healthy meats such as white chicken and fish and whole grains.  - avoid fried foods, fast food, processed  foods, sodas, red meet and other fattening foods.  - get a least 150 minutes of aerobic exercise per week.      No Follow-up on file.  Colin Benton R.

## 2014-10-27 NOTE — Addendum Note (Signed)
Addended by: Agnes Lawrence on: 10/27/2014 10:32 AM   Modules accepted: Orders

## 2014-10-27 NOTE — Patient Instructions (Addendum)
BEFORE YOU LEAVE: -pneumococcal vaccine and see if she wants Tdap -labs -follow up in 4 months  -schedule eye exam  -schedule mammogram  -Vit D3 [phone] IU daily, adequate calcium in diet (1200mg )  We recommend the following healthy lifestyle measures: - eat a healthy diet consisting of lots of vegetables, fruits, beans, nuts, seeds, healthy meats such as white chicken and fish and whole grains.  - avoid fried foods, fast food, processed foods, sodas, red meet and other fattening foods.  - get a least 150 minutes of aerobic exercise per week.

## 2014-10-29 MED ORDER — ATORVASTATIN CALCIUM 40 MG PO TABS: ORAL_TABLET | ORAL | Status: DC

## 2014-10-29 NOTE — Addendum Note (Signed)
Addended by: Agnes Lawrence on: 10/29/2014 12:06 PM   Modules accepted: Orders, Medications

## 2014-10-30 ENCOUNTER — Ambulatory Visit: Payer: No Typology Code available for payment source | Admitting: Radiation Oncology

## 2014-10-31 ENCOUNTER — Ambulatory Visit (INDEPENDENT_AMBULATORY_CARE_PROVIDER_SITE_OTHER): Payer: PRIVATE HEALTH INSURANCE | Admitting: Neurology

## 2014-10-31 ENCOUNTER — Encounter: Payer: Self-pay | Admitting: *Deleted

## 2014-10-31 ENCOUNTER — Encounter: Payer: Self-pay | Admitting: Neurology

## 2014-10-31 VITALS — BP 118/80 | HR 108 | Resp 16 | Ht 63.0 in | Wt 114.0 lb

## 2014-10-31 DIAGNOSIS — H8110 Benign paroxysmal vertigo, unspecified ear: Secondary | ICD-10-CM

## 2014-10-31 DIAGNOSIS — R519 Headache, unspecified: Secondary | ICD-10-CM

## 2014-10-31 DIAGNOSIS — R51 Headache: Secondary | ICD-10-CM

## 2014-10-31 MED ORDER — NORTRIPTYLINE HCL 25 MG PO CAPS: ORAL_CAPSULE | ORAL | Status: DC

## 2014-10-31 NOTE — Progress Notes (Signed)
Per scheduler, Erin [name] : [name] reports patient declined an appointment with them.
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
## 2014-10-31 NOTE — Patient Instructions (Signed)
1. Increase nortriptyline, start taking nortriptyline 25mg : 3 capsules at night 2. Start vestibular therapy for positional vertigo 3. Keep a calendar of your headaches 4. Call our office for any changes in symptoms, follow-up in 3 months

## 2014-11-10 ENCOUNTER — Ambulatory Visit
Admission: RE | Admit: 2014-11-10 | Discharge: 2014-11-10 | Disposition: A | Discharge status: Home / Self Care | Payer: PRIVATE HEALTH INSURANCE | Source: Ambulatory Visit

## 2014-11-10 ENCOUNTER — Telehealth: Payer: Self-pay | Admitting: Family Medicine

## 2014-11-10 DIAGNOSIS — Z1231 Encounter for screening mammogram for malignant neoplasm of breast: Secondary | ICD-10-CM

## 2014-11-10 NOTE — Telephone Encounter (Signed)
Patient would like a referral to see a dermatologist for her scalp.

## 2014-11-11 NOTE — Telephone Encounter (Signed)
I called the pt and she was given the dermatologists numbers from our pamphlet.

## 2014-11-11 NOTE — Telephone Encounter (Signed)
Please provide her with numbers for dermatology offices [facility].

## 2014-11-17 ENCOUNTER — Telehealth: Payer: Self-pay | Admitting: Family Medicine

## 2014-11-17 NOTE — Telephone Encounter (Signed)
Pt needs refill on acyclovir call into [facility]. [facility] said [facility] not received req on 10/27/14

## 2014-11-17 NOTE — Telephone Encounter (Signed)
I called the pharmacist and he stated the Rx is ready for the pt and I called the pt and informed her of this.

## 2014-11-19 ENCOUNTER — Other Ambulatory Visit: Payer: Self-pay | Admitting: Family Medicine

## 2014-12-10 ENCOUNTER — Other Ambulatory Visit: Payer: Self-pay | Admitting: Family Medicine

## 2014-12-11 ENCOUNTER — Other Ambulatory Visit: Payer: Self-pay | Admitting: Family Medicine

## 2014-12-11 MED ORDER — ONDANSETRON HCL 4 MG PO TABS: 4.0000 mg | ORAL_TABLET | Freq: Three times a day (TID) | ORAL | Status: DC | PRN

## 2014-12-11 MED ORDER — ACYCLOVIR 5 % EX CREA: 1.0000 "application " | TOPICAL_CREAM | CUTANEOUS | Status: DC

## 2014-12-23 ENCOUNTER — Other Ambulatory Visit: Payer: Self-pay

## 2014-12-23 NOTE — Telephone Encounter (Signed)
Rx request for Paroxetine HCL tablets- Take 1 tablet by mouth daily #90  Pharm:  [facility]   Pls advise.

## 2014-12-24 MED ORDER — PAROXETINE HCL 20 MG PO TABS: 20.0000 mg | ORAL_TABLET | Freq: Every day | ORAL | Status: DC

## 2015-01-28 ENCOUNTER — Telehealth: Payer: Self-pay | Admitting: Neurology

## 2015-01-28 NOTE — Telephone Encounter (Signed)
Pt canceled her appt through the automated reminder call system. I called/LMOM for pt return my call and confirm that she does not want to r/s.

## 2015-01-30 ENCOUNTER — Ambulatory Visit: Payer: PRIVATE HEALTH INSURANCE | Admitting: Neurology

## 2015-02-25 ENCOUNTER — Other Ambulatory Visit: Payer: Self-pay | Admitting: Oncology

## 2015-03-09 ENCOUNTER — Encounter: Payer: Self-pay | Admitting: Family Medicine

## 2015-03-09 ENCOUNTER — Ambulatory Visit (INDEPENDENT_AMBULATORY_CARE_PROVIDER_SITE_OTHER): Payer: No Typology Code available for payment source | Admitting: Family Medicine

## 2015-03-09 VITALS — BP 112/78 | HR 80 | Temp 98.1°F | Ht 63.0 in | Wt 117.6 lb

## 2015-03-09 DIAGNOSIS — K589 Irritable bowel syndrome without diarrhea: Secondary | ICD-10-CM

## 2015-03-09 DIAGNOSIS — E119 Type 2 diabetes mellitus without complications: Secondary | ICD-10-CM

## 2015-03-09 DIAGNOSIS — R519 Headache, unspecified: Secondary | ICD-10-CM

## 2015-03-09 DIAGNOSIS — F39 Unspecified mood [affective] disorder: Secondary | ICD-10-CM | POA: Diagnosis not present

## 2015-03-09 DIAGNOSIS — G2581 Restless legs syndrome: Secondary | ICD-10-CM

## 2015-03-09 DIAGNOSIS — L280 Lichen simplex chronicus: Secondary | ICD-10-CM

## 2015-03-09 DIAGNOSIS — R112 Nausea with vomiting, unspecified: Secondary | ICD-10-CM

## 2015-03-09 DIAGNOSIS — M549 Dorsalgia, unspecified: Secondary | ICD-10-CM

## 2015-03-09 DIAGNOSIS — R51 Headache: Secondary | ICD-10-CM | POA: Diagnosis not present

## 2015-03-09 DIAGNOSIS — E785 Hyperlipidemia, unspecified: Secondary | ICD-10-CM

## 2015-03-09 LAB — BASIC METABOLIC PANEL
BUN: 6 mg/dL (ref 6–23)
CHLORIDE: 102 meq/L (ref 96–112)
CO2: 32 mEq/L (ref 19–32)
Calcium: 9.2 mg/dL (ref 8.4–10.5)
Creatinine, Ser: 0.63 mg/dL (ref 0.40–1.20)
GFR: 102.97 mL/min (ref >60.00)
GLUCOSE: 88 mg/dL (ref 70–99)
POTASSIUM: 4.2 meq/L (ref 3.5–5.1)
Sodium: 142 mEq/L (ref 135–145)

## 2015-03-09 LAB — LIPID PANEL
CHOL/HDL RATIO: 8
Cholesterol: 293 mg/dL — ABNORMAL HIGH (ref 0–200)
HDL: 36.6 mg/dL — AB (ref >39.00)
LDL Cholesterol: 217 mg/dL — ABNORMAL HIGH (ref 0–99)
NONHDL: 256.4
TRIGLYCERIDES: 198 mg/dL — AB (ref 0.0–149.0)
VLDL: 39.6 mg/dL (ref 0.0–40.0)

## 2015-03-09 LAB — FERRITIN: FERRITIN: 25.9 ng/mL (ref 10.0–291.0)

## 2015-03-09 LAB — HEMOGLOBIN A1C: Hgb A1c MFr Bld: 5.5 % (ref 4.6–6.5)

## 2015-03-09 NOTE — Progress Notes (Signed)
Pre visit review using our clinic review tool, if applicable. No additional management support is needed unless otherwise documented below in the visit note. 

## 2015-03-09 NOTE — Progress Notes (Signed)
HPI:  DM/HLD: -diet controlled diabetes, reports did not tolerate lipitor - makes her stomach sick - not taking anything now - has frequent acid reflux and nausea (she plans to follow up with her gastroenterologist about this) -last eye exam: never -foot exam: Doing today -diet and exercise: doing regular exercise and trying to eat healthy -denies: polyuria, polydipsia, vision changes, wounds on feet, hypoglycemia  Tobacco Use: -reports: stopped smoking 09/2014 on chantix -denies: SOB, DOE, chronic cough, hemoptysis, lung ca screening as had CT scan this year of chest -she smokes one cigarette rarely  Anal Ca: -in remission, followed by onc -doing well  Depression and Anxiety: -meds: paxil 20mg  -reports: doing well -denies: worsening, panic, SI, thoughts of self harm  Vertigo/Headaches: -seen by neurologist -meds:meclizine  IBS/GERD: -followed by GI -stable  Neurodermatitis: -meds: gabapentin -stable, no worsening of picking or itching  ? RLS: -reports started about 6 weeks ago -feels sensation to move legs on several nights per week - then jerks legs -denies: pain, swelling, injury to legs, problems in the past -reports long hx anemia  Back pain: -chronic - mentioned as leaving -hurts with heavy lifting -denies: radiation, weakness, numbness  HM: -Tdap: declined -Optho exam: done   ROS: See pertinent positives and negatives per HPI.  Past Medical History  Diagnosis Date  . 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 Chronic daily headache 03/29/2013    takes bc powder  . IBS (irritable bowel syndrome) 03/29/2013  . Anxiety   . Depression   . HLD (hyperlipidemia)   . Allergy   . Blood transfusion without reported diagnosis     AS A CHILD  . Tubular adenoma of colon 09/08/2003  . Chronic back pain   . Hot flashes   . Alopecia   . Hx of radiation therapy 09/09/13-10/16/13    anal canal 60.4Gy/36fx  . Cancer 08/14/13    invasive squamous cell ca    . DM (diabetes mellitus)     diet controlled  . History of chemotherapy     hx anal cancer  . GERD (gastroesophageal reflux disease)     Past Surgical History  Procedure Laterality Date  . Ectopic pregnancy surgery    . Pilonidal cyst excision    . Colonoscopy    . Flexible sigmoidoscopy N/A 08/14/2013    Procedure: FLEXIBLE SIGMOIDOSCOPY;  Surgeon: Ladene Artist, MD;  Location: [facility];  Service: Endoscopy;  Laterality: N/A;  . Polypectomy    . Tonsillectomy      Family History  Problem Relation Age of Onset  . Arthritis Mother   . Hyperlipidemia Mother   . Heart disease Mother   . Hypertension Mother   . Stroke Mother 69  . Irritable bowel syndrome Mother   . Thyroid disease Mother   . Heart disease Father   . Hyperlipidemia Father   . Hypertension Father   . Stroke Father 58  . Thyroid disease Father   . Prostate cancer Father   . Lung cancer Brother   . Colon cancer Neg Hx   . Rectal cancer Neg Hx   . Stomach cancer Neg Hx     History   Social History  . Marital Status: Married    Spouse Name: N/A  . Number of Children: 0  . Years of Education: N/A   Occupational History  . caregiver    Social History Main Topics  . Smoking status: Former Smoker -- 0.50 packs/day    Types: Cigarettes  Quit date: 10/07/2014  . Smokeless tobacco: Never Used  . Alcohol Use: No  . Drug Use: No  . Sexual Activity:    Partners: Male   Other Topics Concern  . None   Social History Narrative   Work or School: homemaker      Home Situation: lives with her husband,[name] takes care of her elderly parents       Spiritual Beliefs: Christian      Lifestyle: no regular exercise, poor diet                 Current outpatient prescriptions:  .  acyclovir cream (ZOVIRAX) 5 %, Apply 1 application topically every 3 (three) hours., Disp: 15 g, Rfl: 0 .  Biotin 10 MG TABS, Take 10 mg by mouth daily., Disp: , Rfl:  .  cimetidine (TAGAMET) 200 MG tablet, Take  200 mg by mouth as needed., Disp: , Rfl:  .  fluticasone (FLONASE) 50 MCG/ACT nasal spray, Place 2 sprays into the nose daily., Disp: 16 g, Rfl: 6 .  gabapentin (NEURONTIN) 600 MG tablet, Take 1 tablet (600 mg total) by mouth 3 (three) times daily., Disp: 180 tablet, Rfl: 1 .  OVER THE COUNTER MEDICATION, Take 1 capsule by mouth 2 (two) times daily before a meal. Advance colon care II, Disp: , Rfl:  .  PARoxetine (PAXIL) 20 MG tablet, Take 1 tablet (20 mg total) by mouth daily., Disp: 90 tablet, Rfl: 1  EXAM:  Filed Vitals:   03/09/15 1001  BP: 112/78  Pulse: 80  Temp: 98.1 F (36.7 C)    Body mass index is 20.84 kg/(m^2).  GENERAL: vitals reviewed and listed above, alert, oriented, appears well hydrated and in no acute distress  HEENT: atraumatic, conjunttiva clear, no obvious abnormalities on inspection of external nose and ears  NECK: no obvious masses on inspection  LUNGS: clear to auscultation bilaterally, no wheezes, rales or rhonchi, good air movement  CV: HRRR, no peripheral edema  MS: moves all extremities without noticeable abnormality  PSYCH: pleasant and cooperative, no obvious depression or anxiety  ASSESSMENT AND PLAN:  Discussed the following assessment and plan:  Depression and Anxiety -stable  Type 2 diabetes mellitus without complication - Plan: Hemoglobin Z6S, Basic metabolic panel -check labs, lifestyle recs  Hyperlipemia - Plan: Lipid Panel -check labs, may need to restart statin  Chronic daily headache -stable  IBS (irritable bowel syndrome) -sees GI  Neurodermatitis -stable  RLS (restless legs syndrome) - Plan: Ferritin -check ferritin, discussed etiologies, tx  NV: -advised she call her GI doc today for eval  Low back pain: -mentioned as leaving, no alarm features from her description and sound muscular, advise HEP and proper lifting and follow up in 2-4 weeks to evaluate if persists.  -Patient advised to return or notify a doctor  immediately if symptoms worsen or persist or new concerns arise.  Patient Instructions  BEFORE YOU LEAVE: -labs -follow up in 3 months -low back exercises - follow up in 4 weeks if back pain persists  Call you gastroenterologist for appointment about the acid and nause  We recommend the following healthy lifestyle measures: - eat a healthy diet consisting of lots of vegetables, fruits, beans, nuts, seeds, healthy meats such as white chicken and fish and whole grains.  - avoid fried foods, fast food, processed foods, sodas, red meet and other fattening foods.  - get a least 150 minutes of aerobic exercise per week.       Colin Benton  R.

## 2015-03-09 NOTE — Patient Instructions (Addendum)
BEFORE YOU LEAVE: -labs -follow up in 3 months -low back exercises - follow up in 4 weeks if back pain persists  Call you gastroenterologist for appointment about the acid and nause  We recommend the following healthy lifestyle measures: - eat a healthy diet consisting of lots of vegetables, fruits, beans, nuts, seeds, healthy meats such as white chicken and fish and whole grains.  - avoid fried foods, fast food, processed foods, sodas, red meet and other fattening foods.  - get a least 150 minutes of aerobic exercise per week.

## 2015-03-15 ENCOUNTER — Other Ambulatory Visit: Payer: Self-pay | Admitting: Family Medicine

## 2015-03-17 ENCOUNTER — Other Ambulatory Visit: Payer: Self-pay | Admitting: *Deleted

## 2015-03-17 MED ORDER — PRAVASTATIN SODIUM 40 MG PO TABS: 40.0000 mg | ORAL_TABLET | Freq: Every day | ORAL | Status: DC

## 2015-03-17 MED ORDER — ACYCLOVIR 5 % EX CREA: 1.0000 "application " | TOPICAL_CREAM | CUTANEOUS | Status: DC

## 2015-03-17 NOTE — Addendum Note (Signed)
Addended by: Agnes Lawrence on: 03/17/2015 09:57 AM   Modules accepted: Orders

## 2015-03-23 ENCOUNTER — Ambulatory Visit (HOSPITAL_BASED_OUTPATIENT_CLINIC_OR_DEPARTMENT_OTHER): Payer: No Typology Code available for payment source | Admitting: Oncology

## 2015-03-23 VITALS — BP 139/75 | HR 102 | Temp 97.8°F | Resp 20 | Ht 63.0 in | Wt 117.6 lb

## 2015-03-23 DIAGNOSIS — Z85048 Personal history of other malignant neoplasm of rectum, rectosigmoid junction, and anus: Secondary | ICD-10-CM

## 2015-03-23 DIAGNOSIS — Z87891 Personal history of nicotine dependence: Secondary | ICD-10-CM

## 2015-03-23 DIAGNOSIS — C210 Malignant neoplasm of anus, unspecified: Secondary | ICD-10-CM

## 2015-03-23 NOTE — Progress Notes (Signed)
  [facility] OFFICE PROGRESS NOTE   Diagnosis: Anal cancer  INTERVAL HISTORY:   Ms. Beeghly returns as scheduled. She reports resolution of hip pain after attending physical therapy. She continues to have abdominal bloating and irregular bowel habits. She will see Dr. Fuller Plan later this month. No rectal bleeding. She reports occasional fecal incontinence at night. She reports tingling in the extremities and "hot/cold "spots. She has been referred to a neurologist.  Objective:  Vital signs in last 24 hours:  Blood pressure 139/75, pulse 102, temperature 97.8 F (36.6 C), temperature source Oral, resp. rate 20, height 5\' 3"  (1.6 m), weight 117 lb 9.6 oz (53.343 kg), SpO2 99 %.    HEENT: Neck without mass Lymphatics: No cervical, supra-clavicular, axillary, or inguinal nodes Resp: Lungs clear bilaterally Cardio: Regular rate and rhythm GI: No hepatosplenomegaly, nontender, no mass Vascular: No leg edema Rectal: No rectal mass. There is irregularity of the mucosa at the anterior anal canal without masslike nodularity. Small external hemorrhoid at the anal verge. The anal canal is mildly stenotic. Skin: Radiation hyperpigmentation at the perineum    Medications: I have reviewed the patient's current medications.  Assessment/Plan: 1. Squamous cell carcinoma of the anus, clinical stage II (T2 N0), status post endoscopic biopsy 08/14/2013 confirming invasive squamous cell carcinoma. Initiation of radiation and cycle 1 5-FU/mitomycin C. 09/09/2013. Cycle 2 5-FU/mitomycin-C (dose reduction) completed beginning 10/08/2013, radiation completed 10/16/2013. 2. Rectal pain, constipation and bleeding secondary to #1. Resolved. 3. History of Iron deficiency anemia. 4. Vitamin B12 deficiency. 5. Diabetes. 6. Hyperlipidemia. 7. Neurodermatitis. 8. Tobacco use. She reports she has quit smoking. 9. Mucositis following cycle 1 and cycle 2 5-FU/mitomycin C.  10. Skin rash. Initially  felt to be related to the chemotherapy though somewhat atypical in appearance and distribution. The rash has resolved. 11. 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  12. History of hypokalemia. 13. Status post sigmoidoscopy 03/10/2014 with findings of distal rectal/anal canal nodularity measuring 2 cm x 2 cm. Biopsy showed benign ulcerated anorectal junction mucosa and inflammatory debris. Within the debris there were markedly atypical epithelial cell fragments and single cells. 14. Status post sigmoidoscopy 09/23/2014. Area of abnormal mucosa in the distal rectum/anal canal-friable, erythematous, scarring; multiple biopsies performed. Pathology revealed no dysplasia or malignancy. 15. Bilateral hip pain. Status post CT abdomen/pelvis 08/11/2014 with no etiology for the pain identified. Resolved after physical therapy.    Disposition:  Ms. Privitera remains in clinical remission from anal cancer. She will return for an office visit in 6 months. She will continue sigmoidoscopy follow-up with Dr. Fuller Plan.  Betsy Coder, MD  03/23/2015  3:24 PM

## 2015-03-24 ENCOUNTER — Telehealth: Payer: Self-pay | Admitting: Oncology

## 2015-03-24 NOTE — Telephone Encounter (Signed)
Lft msg for pt confirming MD visit for 27mth's per 05/02 POF, mailed schedule and sent msg through my chart.... KJ

## 2015-03-30 ENCOUNTER — Ambulatory Visit: Payer: PRIVATE HEALTH INSURANCE | Admitting: Oncology

## 2015-04-08 ENCOUNTER — Ambulatory Visit (INDEPENDENT_AMBULATORY_CARE_PROVIDER_SITE_OTHER): Payer: No Typology Code available for payment source | Admitting: Gastroenterology

## 2015-04-08 ENCOUNTER — Encounter: Payer: Self-pay | Admitting: Gastroenterology

## 2015-04-08 ENCOUNTER — Other Ambulatory Visit: Payer: Self-pay | Admitting: Family Medicine

## 2015-04-08 VITALS — BP 98/68 | HR 84 | Ht 63.0 in | Wt 117.0 lb

## 2015-04-08 DIAGNOSIS — K5900 Constipation, unspecified: Secondary | ICD-10-CM

## 2015-04-08 DIAGNOSIS — R112 Nausea with vomiting, unspecified: Secondary | ICD-10-CM

## 2015-04-08 DIAGNOSIS — Z85048 Personal history of other malignant neoplasm of rectum, rectosigmoid junction, and anus: Secondary | ICD-10-CM | POA: Diagnosis not present

## 2015-04-08 MED ORDER — OMEPRAZOLE 40 MG PO CPDR: 40.0000 mg | DELAYED_RELEASE_CAPSULE | Freq: Every day | ORAL | Status: DC

## 2015-04-08 NOTE — Patient Instructions (Addendum)
We have sent the following medications to your pharmacy for you to pick up at your convenience: omeprazole.  Patient advised to avoid spicy, acidic, citrus, chocolate, mints, fruit and fruit juices.  Limit the intake of caffeine, alcohol and Soda.  Don't exercise too soon after eating.  Don't lie down within 3-4 hours of eating.  Elevate the head of your bed.  You can use over the counter Miralax mixing 17 grams in 8 oz water 1-2 x daily for constipation.   You have been scheduled for an endoscopy. Please follow written instructions given to you at your visit today. If you use inhalers (even only as needed), please bring them with you on the day of your procedure. Your physician has requested that you go to www.startemmi.com and enter the access code given to you at your visit today. This web site gives a general overview about your procedure. However, you should still follow specific instructions given to you by our office regarding your preparation for the procedure.  Thank you for choosing me and [facility].  Pricilla Riffle. Dagoberto Ligas., MD., [name]

## 2015-04-08 NOTE — Progress Notes (Signed)
    History of Present Illness: This is a 59 year old female complaining of nausea and vomiting several hours after meals and chronic constipation associated with abdominal bloating. She has a history of anal cancer in remission followed by Dr. Benay Spice. She describes nausea and vomiting about 3-4 hours after almost every meal for the past few months. She notes about a 3 pound weight loss. She has chronic constipation associated with bloating and takes an enema intermittently. She takes famotidine 20 mg as needed intermittently.  Current Medications, Allergies, Past Medical History, Past Surgical History, Family History and Social History were reviewed in [employer] record.  Physical Exam: General: Well developed , well nourished, no acute distress Head: Normocephalic and atraumatic Eyes:  sclerae anicteric, EOMI Ears: Normal auditory acuity Mouth: No deformity or lesions Lungs: Clear throughout to auscultation Heart: Regular rate and rhythm; no murmurs, rubs or bruits Abdomen: Soft, non tender and non distended. No masses, hepatosplenomegaly or hernias noted. Normal Bowel sounds Musculoskeletal: Symmetrical with no gross deformities  Pulses:  Normal pulses noted Extremities: No clubbing, cyanosis, edema or deformities noted Neurological: Alert oriented x 4, grossly nonfocal Psychological:  Alert and cooperative. Normal mood and affect  Assessment and Recommendations:  1. Nausea/vomiting. R/O GERD, esophagitis, ulcer, partial GOO, gastroparesis. Begin omeprazole 40 mg daily and standard antireflux measures. Discontinue famotidine. The risks (including bleeding, perforation, infection, missed lesions, medication reactions and possible hospitalization or surgery if complications occur), benefits, and alternatives to endoscopy with possible biopsy and possible dilation were discussed with the patient and they consent to proceed.   2. IBS-C. Miralax 1-2 times daily every  day titrated for adequate bowel movements.   3. History of anal cancer. Followed by Dr. Benay Spice. Flex Sig in 09/2015.

## 2015-05-18 ENCOUNTER — Other Ambulatory Visit: Payer: Self-pay

## 2015-05-21 ENCOUNTER — Other Ambulatory Visit: Payer: Self-pay | Admitting: Family Medicine

## 2015-06-05 ENCOUNTER — Telehealth: Payer: Self-pay | Admitting: Gastroenterology

## 2015-06-05 ENCOUNTER — Encounter: Payer: No Typology Code available for payment source | Admitting: Gastroenterology

## 2015-06-05 NOTE — Telephone Encounter (Signed)
Yes charge 

## 2015-06-05 NOTE — Telephone Encounter (Signed)
Charge patient for no show

## 2015-06-08 ENCOUNTER — Ambulatory Visit: Payer: No Typology Code available for payment source | Admitting: Family Medicine

## 2015-06-19 ENCOUNTER — Encounter: Payer: Self-pay | Admitting: Family Medicine

## 2015-06-19 ENCOUNTER — Ambulatory Visit (INDEPENDENT_AMBULATORY_CARE_PROVIDER_SITE_OTHER): Payer: No Typology Code available for payment source | Admitting: Family Medicine

## 2015-06-19 ENCOUNTER — Other Ambulatory Visit: Payer: Self-pay | Admitting: *Deleted

## 2015-06-19 VITALS — BP 124/80 | HR 114 | Temp 98.4°F | Ht 63.0 in | Wt 116.4 lb

## 2015-06-19 DIAGNOSIS — F39 Unspecified mood [affective] disorder: Secondary | ICD-10-CM | POA: Diagnosis not present

## 2015-06-19 DIAGNOSIS — R42 Dizziness and giddiness: Secondary | ICD-10-CM

## 2015-06-19 DIAGNOSIS — R899 Unspecified abnormal finding in specimens from other organs, systems and tissues: Secondary | ICD-10-CM

## 2015-06-19 DIAGNOSIS — E119 Type 2 diabetes mellitus without complications: Secondary | ICD-10-CM

## 2015-06-19 DIAGNOSIS — L280 Lichen simplex chronicus: Secondary | ICD-10-CM | POA: Diagnosis not present

## 2015-06-19 DIAGNOSIS — Z8619 Personal history of other infectious and parasitic diseases: Secondary | ICD-10-CM

## 2015-06-19 DIAGNOSIS — R202 Paresthesia of skin: Secondary | ICD-10-CM

## 2015-06-19 DIAGNOSIS — K589 Irritable bowel syndrome without diarrhea: Secondary | ICD-10-CM

## 2015-06-19 DIAGNOSIS — E785 Hyperlipidemia, unspecified: Secondary | ICD-10-CM

## 2015-06-19 DIAGNOSIS — M5030 Other cervical disc degeneration, unspecified cervical region: Secondary | ICD-10-CM

## 2015-06-19 LAB — BASIC METABOLIC PANEL
BUN: 4 mg/dL — ABNORMAL LOW (ref 6–23)
CALCIUM: 9.1 mg/dL (ref 8.4–10.5)
CO2: 36 mEq/L — ABNORMAL HIGH (ref 19–32)
Chloride: 99 mEq/L (ref 96–112)
Creatinine, Ser: 0.69 mg/dL (ref 0.40–1.20)
GFR: 92.61 mL/min (ref >60.00)
GLUCOSE: 92 mg/dL (ref 70–99)
POTASSIUM: 2.9 meq/L — AB (ref 3.5–5.1)
Sodium: 144 mEq/L (ref 135–145)

## 2015-06-19 LAB — VITAMIN B12: Vitamin B-12: 175 pg/mL — ABNORMAL LOW (ref 211–911)

## 2015-06-19 LAB — HEMOGLOBIN A1C: HEMOGLOBIN A1C: 5.3 % (ref 4.6–6.5)

## 2015-06-19 LAB — TSH: TSH: 6.38 u[IU]/mL — ABNORMAL HIGH (ref 0.35–4.50)

## 2015-06-19 LAB — MICROALBUMIN / CREATININE URINE RATIO
CREATININE, U: 36.6 mg/dL
Microalb Creat Ratio: 1.9 mg/g (ref 0.0–30.0)
Microalb, Ur: <0.7 mg/dL (ref 0.0–1.9)

## 2015-06-19 MED ORDER — GABAPENTIN 800 MG PO TABS: 800.0000 mg | ORAL_TABLET | Freq: Three times a day (TID) | ORAL | Status: DC

## 2015-06-19 MED ORDER — ACYCLOVIR 5 % EX CREA: 1.0000 "application " | TOPICAL_CREAM | CUTANEOUS | Status: DC

## 2015-06-19 NOTE — Patient Instructions (Signed)
BEFORE YOU LEAVE: -labs -schedule follow up in 3 months  -call to schedule follow up with your neurologist  -We placed a referral for you as discussed to the back doctor regarding your back pain. It usually takes about 1-2 weeks to process and schedule this referral. If you have not heard from [place] regarding this appointment in 2 weeks please contact our office.  -restart paxil daily  -stop the hydrocodone as is likely worsening stomach issues

## 2015-06-19 NOTE — Progress Notes (Signed)
HPI: DM/HLD: -diet controlled diabetes,on pravastatin - but this causes  -foot exam:done -diet and exercise: doing regular exercise and trying to eat healthy -denies: polyuria, polydipsia, vision changes, wounds on feet, hypoglycemia  Anal Ca: -in remission, followed by onc -doing well  Depression and Anxiety: -meds: paxil 20mg  - quit this because she forgot to take it -reports: has been more anxious, stressed since -denies: worsening, panic, SI, thoughts of self harm  Vertigo/Headaches: -seen by neurologist -vertigo is back, she plans to follow up with Dr. Delice Lesch -meds:meclizine  IBS/GERD: -followed by GI -stable -struggling with constipation, seeing GI for this  Neurodermatitis: -meds: gabapentin 600 qid -she increased her neurontin because this has worsened and has occ tingling in fingers and toes -stable, no worsening of picking or itching  Back pain: -hx DDD -chronic - mentioned as leaving -low back pain, hurts with heavy lifting with caring for her mother - taking her mother's hydrocodone once daily -she has had injections in the past that helped - prior doc left [facility] -she wants to see physiatry -denies: radiation, weakness, numbness  HM: -Tdap: declined -Optho exam: done   ROS: See pertinent positives and negatives per HPI.  Past Medical History  Diagnosis Date  . 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 Chronic daily headache 03/29/2013    takes bc powder  . IBS (irritable bowel syndrome) 03/29/2013  . Anxiety   . Depression   . HLD (hyperlipidemia)   . Allergy   . Blood transfusion without reported diagnosis     AS A CHILD  . Tubular adenoma of colon 09/08/2003  . Chronic back pain   . Hot flashes   . Alopecia   . Hx of radiation therapy 09/09/13-10/16/13    anal canal 60.4Gy/35fx  . Cancer 08/14/13    invasive squamous cell ca   . DM (diabetes mellitus)     diet controlled  . History of chemotherapy     hx anal cancer  .  GERD (gastroesophageal reflux disease)     Past Surgical History  Procedure Laterality Date  . Ectopic pregnancy surgery    . Pilonidal cyst excision    . Colonoscopy    . Flexible sigmoidoscopy N/A 08/14/2013    Procedure: FLEXIBLE SIGMOIDOSCOPY;  Surgeon: Ladene Artist, MD;  Location: [facility];  Service: Endoscopy;  Laterality: N/A;  . Polypectomy    . Tonsillectomy      Family History  Problem Relation Age of Onset  . Arthritis Mother   . Hyperlipidemia Mother   . Heart disease Mother   . Hypertension Mother   . Stroke Mother 64  . Irritable bowel syndrome Mother   . Thyroid disease Mother   . Heart disease Father   . Hyperlipidemia Father   . Hypertension Father   . Stroke Father 57  . Thyroid disease Father   . Prostate cancer Father   . Lung cancer Brother   . Colon cancer Neg Hx   . Rectal cancer Neg Hx   . Stomach cancer Neg Hx     History   Social History  . Marital Status: Married    Spouse Name: N/A  . Number of Children: 0  . Years of Education: N/A   Occupational History  . caregiver    Social History Main Topics  . Smoking status: Former Smoker -- 0.50 packs/day    Types: Cigarettes    Quit date: 10/07/2014  . Smokeless tobacco: Never Used  . Alcohol Use: No  .  Drug Use: No  . Sexual Activity:    Partners: Male   Other Topics Concern  . None   Social History Narrative   Work or School: homemaker      Home Situation: lives with her husband,[name] takes care of her elderly parents       Spiritual Beliefs: Christian      Lifestyle: no regular exercise, poor diet                 Current outpatient prescriptions:  .  Biotin 10 MG TABS, Take 10 mg by mouth daily., Disp: , Rfl:  .  fluticasone (FLONASE) 50 MCG/ACT nasal spray, Place 2 sprays into the nose daily., Disp: 16 g, Rfl: 6 .  gabapentin (NEURONTIN) 800 MG tablet, Take 1 tablet (800 mg total) by mouth 3 (three) times daily., Disp: 90 tablet, Rfl: 3 .  omeprazole  (PRILOSEC) 40 MG capsule, Take 1 capsule (40 mg total) by mouth daily., Disp: 30 capsule, Rfl: 11 .  OVER THE COUNTER MEDICATION, Take 1 capsule by mouth 2 (two) times daily before a meal. Advance colon care II, Disp: , Rfl:  .  acyclovir cream (ZOVIRAX) 5 %, Apply 1 application topically every 3 (three) hours., Disp: 15 g, Rfl: 3  EXAM:  Filed Vitals:   06/19/15 1018  BP: 124/80  Pulse: 114  Temp: 98.4 F (36.9 C)    Body mass index is 20.62 kg/(m^2).  GENERAL: vitals reviewed and listed above, alert, oriented, appears well hydrated and in no acute distress  HEENT: atraumatic, conjunttiva clear, no obvious abnormalities on inspection of external nose and ears  NECK: no obvious masses on inspection  LUNGS: clear to auscultation bilaterally, no wheezes, rales or rhonchi, good air movement  CV: HRRR, no peripheral edema  MS: moves all extremities without noticeable abnormality  PSYCH: pleasant and cooperative, no obvious depression or anxiety  ASSESSMENT AND PLAN:  Discussed the following assessment and plan:  Depression and Anxiety  IBS (irritable bowel syndrome)  Type 2 diabetes mellitus without complication - Plan: Microalbumin / creatinine urine ratio, Basic metabolic panel, Hemoglobin A1c  Neurodermatitis - Plan: gabapentin (NEURONTIN) 800 MG tablet, DISCONTINUED: gabapentin (NEURONTIN) 800 MG tablet  Hyperlipemia  Vertigo  DDD (degenerative disc disease), cervical - Plan: Ambulatory referral to Physical Medicine Rehab  H/O cold sores  Paresthesia - Plan: Vitamin B12, TSH  -FASTING labs, micro/alb cr due -advised to follow up with her neurologist as planned -restart paxil -gabapentin dose changed to 800 tid -cold sore medication refilled -referral to physiatry per her request -Patient advised to return or notify a doctor immediately if symptoms worsen or persist or new concerns arise.  Patient Instructions  BEFORE YOU LEAVE: -labs -schedule follow up  in 3 months  -call to schedule follow up with your neurologist  -We placed a referral for you as discussed to the back doctor regarding your back pain. It usually takes about 1-2 weeks to process and schedule this referral. If you have not heard from [place] regarding this appointment in 2 weeks please contact our office.  -restart paxil daily  -stop the hydrocodone as is likely worsening stomach issues       Erin [name] R.

## 2015-06-19 NOTE — Progress Notes (Signed)
Pre visit review using our clinic review tool, if applicable. No additional management support is needed unless otherwise documented below in the visit note. 

## 2015-06-26 ENCOUNTER — Encounter (HOSPITAL_COMMUNITY): Payer: Self-pay | Admitting: *Deleted

## 2015-06-26 ENCOUNTER — Telehealth: Payer: Self-pay | Admitting: Family Medicine

## 2015-06-26 ENCOUNTER — Other Ambulatory Visit (INDEPENDENT_AMBULATORY_CARE_PROVIDER_SITE_OTHER): Payer: No Typology Code available for payment source

## 2015-06-26 ENCOUNTER — Observation Stay (HOSPITAL_COMMUNITY)
Admission: EM | Admit: 2015-06-26 | Discharge: 2015-06-28 | Disposition: A | Discharge status: Home / Self Care | Payer: No Typology Code available for payment source | Attending: Internal Medicine | Admitting: Internal Medicine

## 2015-06-26 DIAGNOSIS — E876 Hypokalemia: Secondary | ICD-10-CM | POA: Diagnosis not present

## 2015-06-26 DIAGNOSIS — Z87891 Personal history of nicotine dependence: Secondary | ICD-10-CM | POA: Diagnosis not present

## 2015-06-26 DIAGNOSIS — E785 Hyperlipidemia, unspecified: Secondary | ICD-10-CM | POA: Diagnosis not present

## 2015-06-26 DIAGNOSIS — E1140 Type 2 diabetes mellitus with diabetic neuropathy, unspecified: Secondary | ICD-10-CM | POA: Diagnosis not present

## 2015-06-26 DIAGNOSIS — R112 Nausea with vomiting, unspecified: Secondary | ICD-10-CM | POA: Insufficient documentation

## 2015-06-26 DIAGNOSIS — Z85048 Personal history of other malignant neoplasm of rectum, rectosigmoid junction, and anus: Secondary | ICD-10-CM | POA: Diagnosis not present

## 2015-06-26 DIAGNOSIS — R899 Unspecified abnormal finding in specimens from other organs, systems and tissues: Secondary | ICD-10-CM

## 2015-06-26 DIAGNOSIS — K589 Irritable bowel syndrome without diarrhea: Secondary | ICD-10-CM | POA: Diagnosis not present

## 2015-06-26 LAB — BASIC METABOLIC PANEL
Anion gap: 7 (ref 5–15)
BUN: 7 mg/dL (ref 6–23)
BUN: <5 mg/dL — ABNORMAL LOW (ref 6–20)
CALCIUM: 8.8 mg/dL (ref 8.4–10.5)
CALCIUM: 8 mg/dL — AB (ref 8.9–10.3)
CHLORIDE: 98 meq/L (ref 96–112)
CHLORIDE: 105 mmol/L (ref 101–111)
CO2: 33 mEq/L — ABNORMAL HIGH (ref 19–32)
CO2: 31 mmol/L (ref 22–32)
CREATININE: 0.72 mg/dL (ref 0.40–1.20)
Creatinine, Ser: 0.65 mg/dL (ref 0.44–1.00)
GFR calc Af Amer: >60 mL/min (ref >60)
GFR calc non Af Amer: >60 mL/min (ref >60)
GFR: 88.17 mL/min (ref >60.00)
GLUCOSE: 90 mg/dL (ref 70–99)
Glucose, Bld: 124 mg/dL — ABNORMAL HIGH (ref 65–99)
POTASSIUM: 2.4 meq/L — AB (ref 3.5–5.1)
Potassium: 2.7 mmol/L — CL (ref 3.5–5.1)
SODIUM: 141 meq/L (ref 135–145)
SODIUM: 143 mmol/L (ref 135–145)

## 2015-06-26 LAB — CBC
HCT: 35.5 % — ABNORMAL LOW (ref 36.0–46.0)
HEMOGLOBIN: 11.6 g/dL — AB (ref 12.0–15.0)
MCH: 30.9 pg (ref 26.0–34.0)
MCHC: 32.7 g/dL (ref 30.0–36.0)
MCV: 94.7 fL (ref 78.0–100.0)
Platelets: 402 10*3/uL — ABNORMAL HIGH (ref 150–400)
RBC: 3.75 MIL/uL — ABNORMAL LOW (ref 3.87–5.11)
RDW: 13.4 % (ref 11.5–15.5)
WBC: 5.9 10*3/uL (ref 4.0–10.5)

## 2015-06-26 LAB — COMPREHENSIVE METABOLIC PANEL
ALBUMIN: 3.4 g/dL — AB (ref 3.5–5.0)
ALK PHOS: 132 U/L — AB (ref 38–126)
ALT: 8 U/L — AB (ref 14–54)
AST: 17 U/L (ref 15–41)
Anion gap: 11 (ref 5–15)
BILIRUBIN TOTAL: 0.4 mg/dL (ref 0.3–1.2)
BUN: 6 mg/dL (ref 6–20)
CO2: 29 mmol/L (ref 22–32)
Calcium: 8.4 mg/dL — ABNORMAL LOW (ref 8.9–10.3)
Chloride: 100 mmol/L — ABNORMAL LOW (ref 101–111)
Creatinine, Ser: 0.77 mg/dL (ref 0.44–1.00)
Glucose, Bld: 128 mg/dL — ABNORMAL HIGH (ref 65–99)
Potassium: 2.3 mmol/L — CL (ref 3.5–5.1)
SODIUM: 140 mmol/L (ref 135–145)
TOTAL PROTEIN: 6.5 g/dL (ref 6.5–8.1)

## 2015-06-26 LAB — T4, FREE: Free T4: 1.11 ng/dL (ref 0.60–1.60)

## 2015-06-26 LAB — LIPASE, BLOOD: Lipase: 28 U/L (ref 22–51)

## 2015-06-26 LAB — TSH: TSH: 6.53 u[IU]/mL — AB (ref 0.35–4.50)

## 2015-06-26 MED ORDER — GABAPENTIN 400 MG PO CAPS: 800.0000 mg | ORAL_CAPSULE | Freq: Once | ORAL | Status: DC

## 2015-06-26 MED ORDER — MAGNESIUM SULFATE 2 GM/50ML IV SOLN
2.0000 g | Freq: Once | INTRAVENOUS | Status: AC
  Administered 2015-06-26: 2 g via INTRAVENOUS
  Filled 2015-06-26: qty 50

## 2015-06-26 MED ORDER — SODIUM CHLORIDE 0.9 % IV BOLUS (SEPSIS)
500.0000 mL | Freq: Once | INTRAVENOUS | Status: AC
  Administered 2015-06-26: 500 mL via INTRAVENOUS

## 2015-06-26 MED ORDER — POTASSIUM CHLORIDE 10 MEQ/100ML IV SOLN
10.0000 meq | INTRAVENOUS | Status: AC
  Administered 2015-06-26 (×5): 10 meq via INTRAVENOUS
  Filled 2015-06-26 (×5): qty 100

## 2015-06-26 NOTE — ED Notes (Signed)
Pt reports having blood drawn at dr office yesterday and was told to come here due to low potassium levels. Hx of same and has been taking po meds with no relief. Reports having chronic n/v that she is going to GI dr for.

## 2015-06-26 NOTE — Telephone Encounter (Signed)
I spoke with the patient and explained that a K of 2.4 in the face of vomiting could be potentially life threatening (arrhythmias) and she agrees to go promptly to ER for further management.  In addition to the vomiting, she has dropped from 2.9 to 2.4 over past couple of days.  Husband agrees and will be taking her there promptly.

## 2015-06-26 NOTE — ED Provider Notes (Signed)
CSN: [id number]     Arrival date & time 06/26/15  1550 History   First MD Initiated Contact with Patient 06/26/15 1758     Chief Complaint  Patient presents with  . Emesis  . Abnormal Lab     (Consider location/radiation/quality/duration/timing/severity/associated sxs/prior Treatment) HPI Comments: 59 year old female with past medical history including anal cancer status post resection, T2DM, IBS, HLD, chronic hypokalemia who presents with abnormal lab work. Patient states that she saw her PCP today, who obtained labs and sent her here due to low potassium level. The patient has had problems with low potassium previously and used to be on supplementation. She was restarted on oral potassium one week ago which she has been taking. She has chronic nausea and vomiting, approximately 3 episodes of vomiting per week, for which she is seeing a gastroenterologist and is scheduling an endoscopy. The vomiting has not been worse recently. No diarrhea or blood in her stool. No abdominal pain, chest pain, or shortness of breath. She does endorse fatigue. She also endorses some dizziness and states that it is consistent with her chronic history of vertigo. No headache or visual changes.  Patient is a 60 y.o. female presenting with vomiting. The history is provided by the patient.  Emesis Associated symptoms: no abdominal pain and no diarrhea     Past Medical History  Diagnosis Date  . 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 Chronic daily headache 03/29/2013    takes bc powder  . IBS (irritable bowel syndrome) 03/29/2013  . Anxiety   . Depression   . HLD (hyperlipidemia)   . Allergy   . Blood transfusion without reported diagnosis     AS A CHILD  . Tubular adenoma of colon 09/08/2003  . Chronic back pain   . Hot flashes   . Alopecia   . Hx of radiation therapy 09/09/13-10/16/13    anal canal 60.4Gy/59fx  . Cancer 08/14/13    invasive squamous cell ca   . DM (diabetes mellitus)     diet  controlled  . History of chemotherapy     hx anal cancer  . GERD (gastroesophageal reflux disease)    Past Surgical History  Procedure Laterality Date  . Ectopic pregnancy surgery    . Pilonidal cyst excision    . Colonoscopy    . Flexible sigmoidoscopy N/A 08/14/2013    Procedure: FLEXIBLE SIGMOIDOSCOPY;  Surgeon: Ladene Artist, MD;  Location: [facility];  Service: Endoscopy;  Laterality: N/A;  . Polypectomy    . Tonsillectomy     Family History  Problem Relation Age of Onset  . Arthritis Mother   . Hyperlipidemia Mother   . Heart disease Mother   . Hypertension Mother   . Stroke Mother 34  . Irritable bowel syndrome Mother   . Thyroid disease Mother   . Heart disease Father   . Hyperlipidemia Father   . Hypertension Father   . Stroke Father 30  . Thyroid disease Father   . Prostate cancer Father   . Lung cancer Brother   . Colon cancer Neg Hx   . Rectal cancer Neg Hx   . Stomach cancer Neg Hx    History  Substance Use Topics  . Smoking status: Former Smoker -- 0.50 packs/day    Types: Cigarettes    Quit date: 10/07/2014  . Smokeless tobacco: Never Used  . Alcohol Use: No   OB History    No data available     Review of  Systems  Respiratory: Negative for chest tightness and shortness of breath.   Gastrointestinal: Positive for vomiting. Negative for abdominal pain and diarrhea.  Genitourinary: Negative for difficulty urinating.  Neurological: Positive for dizziness.  All other systems reviewed and are negative.     Allergies  Dexamethasone; Atorvastatin; Claritin; Sulfa antibiotics; and Prednisone  Home Medications   Prior to Admission medications   Medication Sig Start Date End Date Taking? Authorizing Provider  acyclovir cream (ZOVIRAX) 5 % Apply 1 application topically every 3 (three) hours. 06/19/15  Yes Lucretia Kern, DO  Biotin 10 MG TABS Take 10 mg by mouth daily.   Yes Historical Provider, MD  gabapentin (NEURONTIN) 800 MG tablet Take 1  tablet (800 mg total) by mouth 3 (three) times daily. 06/19/15  Yes Lucretia Kern, DO  nortriptyline (PAMELOR) 25 MG capsule Take 25 mg by mouth 3 (three) times daily. 04/06/15  Yes Historical Provider, MD  omeprazole (PRILOSEC) 40 MG capsule Take 1 capsule (40 mg total) by mouth daily. 04/08/15  Yes Ladene Artist, MD  OVER THE COUNTER MEDICATION Take 1 capsule by mouth 2 (two) times daily before a meal. Advance colon care II   Yes Historical Provider, MD   BP 155/76 mmHg  Pulse 78  Temp(Src) 98.3 F (36.8 C) (Oral)  Resp 17  Ht 5\' 3"  (1.6 m)  Wt 117 lb (53.071 kg)  BMI 20.73 kg/m2  SpO2 98% Physical Exam  Constitutional: She is oriented to person, place, and time. She appears well-developed and well-nourished. No distress.  HENT:  Head: Normocephalic and atraumatic.  Moist mucous membranes  Eyes: Conjunctivae are normal. Pupils are equal, round, and reactive to light.  Neck: Neck supple.  Cardiovascular: Normal rate, regular rhythm and normal heart sounds.   No murmur heard. Pulmonary/Chest: Effort normal and breath sounds normal.  Abdominal: Soft. Bowel sounds are normal. She exhibits no distension. There is no tenderness.  Musculoskeletal: She exhibits no edema.  Neurological: She is alert and oriented to person, place, and time.  Fluent speech  Skin: Skin is warm and dry.  Psychiatric: She has a normal mood and affect. Judgment normal.  Nursing note and vitals reviewed.   ED Course  Procedures (including critical care time) Labs Review Labs Reviewed  COMPREHENSIVE METABOLIC PANEL - Abnormal; Notable for the following:    Potassium 2.3 (*)    Chloride 100 (*)    Glucose, Bld 128 (*)    Calcium 8.4 (*)    Albumin 3.4 (*)    ALT 8 (*)    Alkaline Phosphatase 132 (*)    All other components within normal limits  CBC - Abnormal; Notable for the following:    RBC 3.75 (*)    Hemoglobin 11.6 (*)    HCT 35.5 (*)    Platelets 402 (*)    All other components within normal  limits  BASIC METABOLIC PANEL - Abnormal; Notable for the following:    Potassium 2.7 (*)    Glucose, Bld 124 (*)    BUN <5 (*)    Calcium 8.0 (*)    All other components within normal limits  LIPASE, BLOOD    Imaging Review No results found.   EKG Interpretation   Date/Time:  Friday June 26 2015 18:21:35 EDT Ventricular Rate:  82 PR Interval:  173 QRS Duration: 88 QT Interval:  436 QTC Calculation: 509 R Axis:   60 Text Interpretation:  Sinus rhythm Borderline prolonged QT interval  borderline ST depression in  lateral leads Confirmed by LITTLE MD, RACHEL  [phone]) on 06/27/2015 12:55:44 AM      MDM   Final diagnoses:  None  hypokalemia  59 year old female with history of hypokalemia who presents for hypokalemia found by her PCP in the clinic today. Patient well-appearing with normal vital signs at presentation. Placed on cardiac monitoring and obtained EKG. Obtained labs listed above which showed potassium 2.3. Gave the patient 50 meq IV K and 2g IV magnesium.  Repeat potassium remains very low at 2.7. I am concerned about the patient's high risk of arrhythmia given his persistently low potassium despite repletion including magnesium repletion. The patient will be admitted for further treatment of her hypokalemia.  Sharlett Iles, MD 06/27/15 [phone]

## 2015-06-26 NOTE — Telephone Encounter (Signed)
-----   Message from Marcina Millard, [place] sent at 06/26/2015  2:52 PM EDT ----- Regarding: FW: Critical lab   ----- Message -----    From: Agnes Lawrence, CMA    Sent: 06/26/2015   2:34 PM      To: Eulas Post, MD Subject: Critical lab                                   [place] called from the lab at the [place] office-potassium drawn at 9:51am is 2.4.

## 2015-06-26 NOTE — ED Notes (Addendum)
Pt states low K, 2.3, weakness, can't walk due to weakness x 1 month.

## 2015-06-26 NOTE — ED Notes (Signed)
Pt reports itching; reports taking 800mg  gabapentin and when missing a dose, pt itches. EDP aware, orders placed;

## 2015-06-26 NOTE — Telephone Encounter (Signed)
I spoke with Dr Elease Hashimoto and he reviewed the pts lab and stated the pt should go to the ER for IV potassium if she is having any nausea or vomiting and if not; she should begin taking K-Dur 35meq BID.  I called the pt and she stated she has had some nausea and vomiting and began taking a potassium supplement last Saturday-read off Pot Chlor 67meq from her bottle that was given by her oncologist before and I advised her she should go to the ER as this level is dangerously low per Dr Elease Hashimoto and she stated "she feels it makes no sense for her to go to the ER for this" and I advised her the IV will work quicker than the pill she is taking since this is so low.  Stated she will check with her husband on this.

## 2015-06-26 NOTE — Telephone Encounter (Signed)
Erin [name] came in this morning for lab work and asked to see if she can take the B12 sublingual  instead of the B12 injections and is wanting to know what are the differences between the two.

## 2015-06-27 ENCOUNTER — Observation Stay (HOSPITAL_COMMUNITY): Payer: No Typology Code available for payment source

## 2015-06-27 ENCOUNTER — Encounter (HOSPITAL_COMMUNITY): Payer: Self-pay | Admitting: Internal Medicine

## 2015-06-27 DIAGNOSIS — R112 Nausea with vomiting, unspecified: Secondary | ICD-10-CM | POA: Diagnosis not present

## 2015-06-27 DIAGNOSIS — E876 Hypokalemia: Secondary | ICD-10-CM

## 2015-06-27 LAB — BASIC METABOLIC PANEL
ANION GAP: 5 (ref 5–15)
BUN: <5 mg/dL — ABNORMAL LOW (ref 6–20)
CO2: 29 mmol/L (ref 22–32)
Calcium: 8 mg/dL — ABNORMAL LOW (ref 8.9–10.3)
Chloride: 108 mmol/L (ref 101–111)
Creatinine, Ser: 0.77 mg/dL (ref 0.44–1.00)
GFR calc Af Amer: >60 mL/min (ref >60)
GFR calc non Af Amer: >60 mL/min (ref >60)
Glucose, Bld: 79 mg/dL (ref 65–99)
Potassium: 3.2 mmol/L — ABNORMAL LOW (ref 3.5–5.1)
SODIUM: 142 mmol/L (ref 135–145)

## 2015-06-27 LAB — CBC WITH DIFFERENTIAL/PLATELET
Basophils Absolute: 0 10*3/uL (ref 0.0–0.1)
Basophils Relative: 0 % (ref 0–1)
Eosinophils Absolute: 0.1 10*3/uL (ref 0.0–0.7)
Eosinophils Relative: 2 % (ref 0–5)
HEMATOCRIT: 32.2 % — AB (ref 36.0–46.0)
Hemoglobin: 10.4 g/dL — ABNORMAL LOW (ref 12.0–15.0)
Lymphocytes Relative: 19 % (ref 12–46)
Lymphs Abs: 0.9 10*3/uL (ref 0.7–4.0)
MCH: 30.3 pg (ref 26.0–34.0)
MCHC: 32.3 g/dL (ref 30.0–36.0)
MCV: 93.9 fL (ref 78.0–100.0)
Monocytes Absolute: 0.7 10*3/uL (ref 0.1–1.0)
Monocytes Relative: 14 % — ABNORMAL HIGH (ref 3–12)
Neutro Abs: 3.2 10*3/uL (ref 1.7–7.7)
Neutrophils Relative %: 65 % (ref 43–77)
PLATELETS: 352 10*3/uL (ref 150–400)
RBC: 3.43 MIL/uL — ABNORMAL LOW (ref 3.87–5.11)
RDW: 13.5 % (ref 11.5–15.5)
WBC: 4.9 10*3/uL (ref 4.0–10.5)

## 2015-06-27 LAB — COMPREHENSIVE METABOLIC PANEL
ALBUMIN: 2.7 g/dL — AB (ref 3.5–5.0)
ALT: 8 U/L — AB (ref 14–54)
AST: 14 U/L — AB (ref 15–41)
Alkaline Phosphatase: 121 U/L (ref 38–126)
Anion gap: 5 (ref 5–15)
BUN: <5 mg/dL — ABNORMAL LOW (ref 6–20)
CO2: 30 mmol/L (ref 22–32)
CREATININE: 0.58 mg/dL (ref 0.44–1.00)
Calcium: 7.9 mg/dL — ABNORMAL LOW (ref 8.9–10.3)
Chloride: 109 mmol/L (ref 101–111)
GLUCOSE: 83 mg/dL (ref 65–99)
POTASSIUM: 3.2 mmol/L — AB (ref 3.5–5.1)
Sodium: 144 mmol/L (ref 135–145)
TOTAL PROTEIN: 5.5 g/dL — AB (ref 6.5–8.1)
Total Bilirubin: 0.2 mg/dL — ABNORMAL LOW (ref 0.3–1.2)

## 2015-06-27 LAB — MAGNESIUM: Magnesium: 2.3 mg/dL (ref 1.7–2.4)

## 2015-06-27 MED ORDER — NORTRIPTYLINE HCL 25 MG PO CAPS
25.0000 mg | ORAL_CAPSULE | Freq: Three times a day (TID) | ORAL | Status: DC
  Administered 2015-06-27 – 2015-06-28 (×4): 25 mg via ORAL
  Filled 2015-06-27 (×4): qty 1

## 2015-06-27 MED ORDER — POTASSIUM CHLORIDE 10 MEQ/100ML IV SOLN
10.0000 meq | INTRAVENOUS | Status: AC
Start: 2015-06-27 — End: 2015-06-27
  Administered 2015-06-27 (×4): 10 meq via INTRAVENOUS
  Filled 2015-06-27 (×4): qty 100

## 2015-06-27 MED ORDER — ONDANSETRON HCL 4 MG PO TABS: 4.0000 mg | ORAL_TABLET | Freq: Four times a day (QID) | ORAL | Status: DC | PRN

## 2015-06-27 MED ORDER — ONDANSETRON HCL 4 MG/2ML IJ SOLN: 4.0000 mg | Freq: Four times a day (QID) | INTRAMUSCULAR | Status: DC | PRN

## 2015-06-27 MED ORDER — ENOXAPARIN SODIUM 40 MG/0.4ML ~~LOC~~ SOLN
40.0000 mg | SUBCUTANEOUS | Status: DC
  Administered 2015-06-27: 40 mg via SUBCUTANEOUS
  Filled 2015-06-27: qty 0.4

## 2015-06-27 MED ORDER — PANTOPRAZOLE SODIUM 40 MG PO TBEC
40.0000 mg | DELAYED_RELEASE_TABLET | Freq: Every day | ORAL | Status: DC
  Administered 2015-06-27 – 2015-06-28 (×2): 40 mg via ORAL
  Filled 2015-06-27 (×2): qty 1

## 2015-06-27 MED ORDER — POTASSIUM CHLORIDE IN NACL 20-0.9 MEQ/L-% IV SOLN
INTRAVENOUS | Status: DC
  Administered 2015-06-27: 03:00:00 via INTRAVENOUS
  Filled 2015-06-27 (×2): qty 1000

## 2015-06-27 MED ORDER — MECLIZINE HCL 25 MG PO TABS
12.5000 mg | ORAL_TABLET | Freq: Once | ORAL | Status: AC
  Administered 2015-06-27: 12.5 mg via ORAL
  Filled 2015-06-27: qty 1

## 2015-06-27 MED ORDER — ACETAMINOPHEN 650 MG RE SUPP: 650.0000 mg | Freq: Four times a day (QID) | RECTAL | Status: DC | PRN

## 2015-06-27 MED ORDER — HYDRALAZINE HCL 20 MG/ML IJ SOLN: 5.0000 mg | INTRAMUSCULAR | Status: DC | PRN

## 2015-06-27 MED ORDER — ACETAMINOPHEN 325 MG PO TABS
650.0000 mg | ORAL_TABLET | Freq: Four times a day (QID) | ORAL | Status: DC | PRN
  Administered 2015-06-27 (×2): 650 mg via ORAL
  Filled 2015-06-27 (×2): qty 2

## 2015-06-27 MED ORDER — POTASSIUM CHLORIDE CRYS ER 20 MEQ PO TBCR
40.0000 meq | EXTENDED_RELEASE_TABLET | Freq: Two times a day (BID) | ORAL | Status: DC
  Administered 2015-06-27: 40 meq via ORAL
  Filled 2015-06-27: qty 2

## 2015-06-27 MED ORDER — GABAPENTIN 400 MG PO CAPS
800.0000 mg | ORAL_CAPSULE | Freq: Three times a day (TID) | ORAL | Status: DC
  Administered 2015-06-27 – 2015-06-28 (×4): 800 mg via ORAL
  Filled 2015-06-27 (×8): qty 2

## 2015-06-27 MED ORDER — POTASSIUM CHLORIDE CRYS ER 20 MEQ PO TBCR
100.0000 meq | EXTENDED_RELEASE_TABLET | Freq: Once | ORAL | Status: AC
  Administered 2015-06-27: 100 meq via ORAL
  Filled 2015-06-27: qty 5

## 2015-06-27 MED ORDER — POTASSIUM CHLORIDE CRYS ER 20 MEQ PO TBCR: 100.0000 meq | EXTENDED_RELEASE_TABLET | Freq: Once | ORAL | Status: DC

## 2015-06-27 NOTE — Progress Notes (Signed)
[facility] PROGRESS NOTE  Erin [name] ZOX:[id number] DOB: 02-03-1956 DOA: 06/26/2015 PCP: [name], DO  Assessment/Plan: 1. Severe hypokalemia - probably secondary to recent vomiting. K is improving, however remains low. Will continue to replace until K is repleted. 2. Nausea vomiting with some abdominal discomfort in the right upper quadrant - patient is to follow-up with Dr. Fuller Plan as outpatient GI. I LFT's and abd US unremarkable. Pt has now tolerated regular diet 3. Elevated blood pressure - closely follow blood pressure trend. Stable thus far. 4. History of rectal cancer being followed by oncologist and gastroenterologist. 5. History of IBS. Stable. Per GI as outpatient 6. History of neuropathy. Stable 7. Tobacco abuse - patient advised to quit tobacco use.  Code Status: Full Family Communication: Pt in room, family at bedside (indicate person spoken with, relationship, and if by phone, the number) Disposition Plan: Pending   Consultants:    Procedures:    Antibiotics:   (indicate start date, and stop date if known)  HPI/Subjective: Eager to go home. Now tolerating diet  Objective: Filed Vitals:   06/27/15 0235 06/27/15 0249 06/27/15 0641 06/27/15 1351  BP: 175/91 141/125 142/74 144/79  Pulse: 77 75 66 84  Temp: 98.3 F (36.8 C)  98 F (36.7 C) 98.1 F (36.7 C)  TempSrc: Oral  Oral Oral  Resp: 16  12   Height:      Weight:      SpO2: 100%  98% 99%    Intake/Output Summary (Last 24 hours) at 06/27/15 1720 Last data filed at 06/27/15 1449  Gross per 24 hour  Intake 1143.33 ml  Output    875 ml  Net 268.33 ml   Filed Weights   06/26/15 1630 06/27/15 0234  Weight: 53.071 kg (117 lb) 55.702 kg (122 lb 12.8 oz)    Exam:   General:  Awake, in nad  Cardiovascular: regular, s1, s2  Respiratory: normal resp effort, no wheezing  Abdomen: soft,nondistend  Musculoskeletal: perfused, no clubbing   Data Reviewed: Basic Metabolic  Panel:  Recent Labs Lab 06/26/15 0957 06/26/15 1639 06/26/15 2303 06/27/15 0519 06/27/15 1455  NA 141 140 143 144 142  K 2.4* 2.3* 2.7* 3.2* 3.2*  CL 98 100* 105 109 108  CO2 33* 29 31 30 29   GLUCOSE 90 128* 124* 83 79  BUN 7 6 <5* <5* <5*  CREATININE 0.72 0.77 0.65 0.58 0.77  CALCIUM 8.8 8.4* 8.0* 7.9* 8.0*  MG  --   --   --  2.3  --    Liver Function Tests:  Recent Labs Lab 06/26/15 1639 06/27/15 0519  AST 17 14*  ALT 8* 8*  ALKPHOS 132* 121  BILITOT 0.4 0.2*  PROT 6.5 5.5*  ALBUMIN 3.4* 2.7*    Recent Labs Lab 06/26/15 1639  LIPASE 28   No results for input(s): AMMONIA in the last 168 hours. CBC:  Recent Labs Lab 06/26/15 1639 06/27/15 0519  WBC 5.9 4.9  NEUTROABS  --  3.2  HGB 11.6* 10.4*  HCT 35.5* 32.2*  MCV 94.7 93.9  PLT 402* 352   Cardiac Enzymes: No results for input(s): CKTOTAL, CKMB, CKMBINDEX, TROPONINI in the last 168 hours. BNP (last 3 results) No results for input(s): BNP in the last 8760 hours.  ProBNP (last 3 results) No results for input(s): PROBNP in the last 8760 hours.  CBG: No results for input(s): GLUCAP in the last 168 hours.  No results found for this or any previous visit (from  the past 240 hour(s)).   Studies: US Abdomen Complete  06/27/2015   CLINICAL DATA:  59 year old female with nausea and vomiting.  EXAM: ULTRASOUND ABDOMEN COMPLETE  COMPARISON:  CT the abdomen and pelvis 08/11/2014.  FINDINGS: Gallbladder: No gallstones or wall thickening visualized. No sonographic Murphy sign noted.  Common bile duct: Diameter: 5.9  Liver: No focal lesion identified. Within normal limits in parenchymal echogenicity.  IVC: No abnormality visualized.  Pancreas: Visualized portion unremarkable.  Spleen: Size and appearance within normal limits.  Right Kidney: Length: 9.9 cm. Echogenicity within normal limits. No mass or hydronephrosis visualized.  Left Kidney: Could not be adequately visualized, as a patient was not mobile.  Abdominal  aorta: No aneurysm visualized.  Other findings: None.  IMPRESSION: 1. No acute findings to account for the patient's symptoms. 2. Left kidney cannot be visualized.   Electronically Signed   By: Vinnie Langton M.D.   On: 06/27/2015 08:24    Scheduled Meds: . enoxaparin (LOVENOX) injection  40 mg Subcutaneous Q24H  . gabapentin  800 mg Oral TID  . nortriptyline  25 mg Oral TID  . pantoprazole  40 mg Oral Daily  . potassium chloride  10 mEq Intravenous Q1 Hr x 4   Continuous Infusions:   Principal Problem:   Hypokalemia Active Problems:   Nausea & vomiting   CHIU, STEPHEN K  [facility] Pager [phone]. If 7PM-7AM, please contact night-coverage at [email or web address], password [facility] 06/27/2015, 5:20 PM

## 2015-06-27 NOTE — H&P (Signed)
[facility] History and Physical  Erin [name] UMP:[id number] DOB: [birth year] DOA: 06/26/2015  Referring physician: Dr. Rex Kras. PCP: [name], DO  Specialists: Dr. Learta Codding. Oncologist.  Chief Complaint: Hypokalemia.  HPI: Erin [name] is a 59 y.o. female with history of rectal cancer status post chemoradiation, irritable bowel syndrome, neuropathy presents to ER with hypokalemia. Patient has been having some nausea and vomiting off and on for last couple of weeks. Patient is being followed up by Dr. Fuller Plan of GI and is planning EGD end of this month as per the patient. In the ER patient had firings of potassium despite which patient's potassium is still low at around 2.7. Patient's EKG shows prolonged QT. Patient also states he has some right upper quadrant pain off and on. On exam patient's abdomen is nontender at this time but patient states before she had bowel movement was mildly tender. Denies any diarrhea. Patient has been admitted for further observation and management.   Review of Systems: As presented in the history of presenting illness, rest negative.  Past Medical History  Diagnosis Date  . 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 Chronic daily headache 03/29/2013    takes bc powder  . IBS (irritable bowel syndrome) 03/29/2013  . Anxiety   . Depression   . HLD (hyperlipidemia)   . Allergy   . Blood transfusion without reported diagnosis     AS A CHILD  . Tubular adenoma of colon 09/08/2003  . Chronic back pain   . Hot flashes   . Alopecia   . Hx of radiation therapy 09/09/13-10/16/13    anal canal 60.4Gy/25fx  . Cancer 08/14/13    invasive squamous cell ca   . DM (diabetes mellitus)     diet controlled  . History of chemotherapy     hx anal cancer  . GERD (gastroesophageal reflux disease)    Past Surgical History  Procedure Laterality Date  . Ectopic pregnancy surgery    . Pilonidal cyst excision    . Colonoscopy    . Flexible  sigmoidoscopy N/A 08/14/2013    Procedure: FLEXIBLE SIGMOIDOSCOPY;  Surgeon: Ladene Artist, MD;  Location: [facility];  Service: Endoscopy;  Laterality: N/A;  . Polypectomy    . Tonsillectomy     Social History:  reports that she quit smoking about 8 months ago. Her smoking use included Cigarettes. She smoked 0.50 packs per day. She has never used smokeless tobacco. She reports that she does not drink alcohol or use illicit drugs. Where does patient live home. Can patient participate in ADLs? Yes.  Allergies  Allergen Reactions  . Dexamethasone Itching    Itching  . Atorvastatin Nausea And Vomiting  . Claritin [Loratadine]     Hot flashes  . Sulfa Antibiotics Other (See Comments)    GI distress-pain  . Prednisone Rash    Family History:  Family History  Problem Relation Age of Onset  . Arthritis Mother   . Hyperlipidemia Mother   . Heart disease Mother   . Hypertension Mother   . Stroke Mother 66  . Irritable bowel syndrome Mother   . Thyroid disease Mother   . Heart disease Father   . Hyperlipidemia Father   . Hypertension Father   . Stroke Father 44  . Thyroid disease Father   . Prostate cancer Father   . Lung cancer Brother   . Colon cancer Neg Hx   . Rectal cancer Neg Hx   . Stomach cancer  Neg Hx       Prior to Admission medications   Medication Sig Start Date End Date Taking? Authorizing Provider  acyclovir cream (ZOVIRAX) 5 % Apply 1 application topically every 3 (three) hours. 06/19/15  Yes Lucretia Kern, DO  Biotin 10 MG TABS Take 10 mg by mouth daily.   Yes Historical Provider, MD  gabapentin (NEURONTIN) 800 MG tablet Take 1 tablet (800 mg total) by mouth 3 (three) times daily. 06/19/15  Yes Lucretia Kern, DO  nortriptyline (PAMELOR) 25 MG capsule Take 25 mg by mouth 3 (three) times daily. 04/06/15  Yes Historical Provider, MD  omeprazole (PRILOSEC) 40 MG capsule Take 1 capsule (40 mg total) by mouth daily. 04/08/15  Yes Ladene Artist, MD  OVER THE COUNTER  MEDICATION Take 1 capsule by mouth 2 (two) times daily before a meal. Advance colon care II   Yes Historical Provider, MD    Physical Exam: Filed Vitals:   06/26/15 2330 06/27/15 0000 06/27/15 0030 06/27/15 0100  BP: 155/76 156/88 156/92 152/104  Pulse: 78 76 73 76  Temp:      TempSrc:      Resp: 17 10 17 19   Height:      Weight:      SpO2: 98% 99% 99% 99%     General:  Moderately built and poorly nourished.  Eyes: Anicteric no pallor.  ENT: No discharge from the ears eyes nose or mouth.  Neck: No mass felt.  Cardiovascular: S1 and S2 heard.  Respiratory: No CREPITATIONS.  Abdomen: Soft nontender bowel sounds present.  Skin: No rash.  Musculoskeletal: No edema.  Psychiatric: Appears normal.  Neurologic: Alert awake oriented to time place and person. Moves all extremities.  Labs on Admission:  Basic Metabolic Panel:  Recent Labs Lab 06/26/15 0957 06/26/15 1639 06/26/15 2303  NA 141 140 143  K 2.4* 2.3* 2.7*  CL 98 100* 105  CO2 33* 29 31  GLUCOSE 90 128* 124*  BUN 7 6 <5*  CREATININE 0.72 0.77 0.65  CALCIUM 8.8 8.4* 8.0*   Liver Function Tests:  Recent Labs Lab 06/26/15 1639  AST 17  ALT 8*  ALKPHOS 132*  BILITOT 0.4  PROT 6.5  ALBUMIN 3.4*    Recent Labs Lab 06/26/15 1639  LIPASE 28   No results for input(s): AMMONIA in the last 168 hours. CBC:  Recent Labs Lab 06/26/15 1639  WBC 5.9  HGB 11.6*  HCT 35.5*  MCV 94.7  PLT 402*   Cardiac Enzymes: No results for input(s): CKTOTAL, CKMB, CKMBINDEX, TROPONINI in the last 168 hours.  BNP (last 3 results) No results for input(s): BNP in the last 8760 hours.  ProBNP (last 3 results) No results for input(s): PROBNP in the last 8760 hours.  CBG: No results for input(s): GLUCAP in the last 168 hours.  Radiological Exams on Admission: No results found.  EKG: Independently reviewed. Normal sinus rhythm with prolonged QT of 509 ms.  Assessment/Plan Principal Problem:    Hypokalemia Active Problems:   Nausea & vomiting   1. Severe hypokalemia - probably secondary to vomiting. Since patient also has elevated blood pressure at this time we will check Reniin Aldosterone ratio. I have ordered potassium chloride 100 mg by mouth 1 time dose now recheck in a.m. along with I have ordered gentle fluid infusion with potassium. Patient has already received magnesium in the ER. 2. Nausea vomiting with some abdominal discomfort in the right upper quadrant - patient is to follow-up  with Dr. Fuller Plan as outpatient GI. I have ordered a sonogram of the abdomen to check for gallbladder pathology. 3. Elevated blood pressure - closely follow blood pressure trend. I have placed patient on when necessary IV hydralazine for now. 4. History of rectal cancer being followed by oncologist and gastroenterologist. 5. History of IBS. 6. History of neuropathy. 7. Tobacco abuse - patient advised to quit tobacco use.  I have reviewed patient's old charts and proceeded patient's EKG.   DVT Prophylaxis Lovenox.  Code Status: Full code.  Family Communication: Discussed patient's husband.  Disposition Plan: Admit to inpatient.    KAKRAKANDY,ARSHAD N. [facility] Pager [phone].  If 7PM-7AM, please contact night-coverage [email or web address] Password [email or web address] 06/27/2015, 1:36 AM

## 2015-06-28 DIAGNOSIS — R112 Nausea with vomiting, unspecified: Secondary | ICD-10-CM

## 2015-06-28 DIAGNOSIS — E876 Hypokalemia: Secondary | ICD-10-CM | POA: Diagnosis not present

## 2015-06-28 LAB — BASIC METABOLIC PANEL
ANION GAP: 6 (ref 5–15)
BUN: <5 mg/dL — ABNORMAL LOW (ref 6–20)
CALCIUM: 8.4 mg/dL — AB (ref 8.9–10.3)
CHLORIDE: 108 mmol/L (ref 101–111)
CO2: 29 mmol/L (ref 22–32)
Creatinine, Ser: 0.58 mg/dL (ref 0.44–1.00)
GFR calc Af Amer: >60 mL/min (ref >60)
GFR calc non Af Amer: >60 mL/min (ref >60)
Glucose, Bld: 94 mg/dL (ref 65–99)
Potassium: 3.8 mmol/L (ref 3.5–5.1)
SODIUM: 143 mmol/L (ref 135–145)

## 2015-06-28 NOTE — Progress Notes (Signed)
06/28/15 Patient discharged today, IV site removed, telemetry removed, and discharge instructions reviewed with patient.

## 2015-06-28 NOTE — Discharge Summary (Signed)
Physician Discharge Summary  MATTI KILLINGSWORTH HKV:[id number] DOB: [birth year] DOA: 06/26/2015  PCP: Colin Benton R., DO  Admit date: 06/26/2015 Discharge date: 06/28/2015  Time spent: 20 minutes  Recommendations for Outpatient Follow-up:  1. Follow up with PCP in 1-2 weeks. 2. Follow up with Dr. Fuller Plan for scheduled endoscopy 3. Please repeat renal panel within one week, focus on potassium level and consider scheduled daily replacement if low  Discharge Diagnoses:  Principal Problem:   Hypokalemia Active Problems:   Nausea & vomiting   Discharge Condition: Improved  Diet recommendation: Regular  Filed Weights   06/26/15 1630 06/27/15 0234  Weight: 53.071 kg (117 lb) 55.702 kg (122 lb 12.8 oz)    History of present illness:  Please see admit h and p for details. Briefly, pt presented with severe hyopkalemia in the setting of nausea/vomiting prior to admission. The patient was admitted for further work up.  Hospital Course:  1. Severe hypokalemia - likely secondary to recent vomiting prior to admission. Potassium was ultimately corrected during this admission. Recommend repeating potassium levels within one week and consider scheduled daily replacement if low. K on discharge of 3.8 2. Nausea vomiting with some abdominal discomfort in the right upper quadrant - patient is to follow-up with Dr. Fuller Plan as outpatient GI. I LFT's and abd US unremarkable. Pt has tolerated regular diet without nausea or vomiting 3. Elevated blood pressure - closely follow blood pressure trend. Remained stable thus far. 4. History of rectal cancer being followed by oncologist and gastroenterologist. 5. History of IBS. Stable. Per GI as outpatient 6. History of neuropathy. Stable 7. Tobacco abuse - patient advised to quit tobacco use.   Discharge Exam: Filed Vitals:   06/27/15 1351 06/27/15 2111 06/27/15 2133 06/28/15 0514  BP: 144/79 162/79 145/76 139/75  Pulse: 84 91  74  Temp: 98.1 F (36.7 C) 98.3 F  (36.8 C)  98 F (36.7 C)  TempSrc: Oral Oral  Oral  Resp:  18  18  Height:      Weight:      SpO2: 99% 97%  98%    General: awake, in nad Cardiovascular: regular, s1, s2 Respiratory: normal resp effort, no wheezing  Discharge Instructions     Medication List    TAKE these medications        acyclovir cream 5 %  Commonly known as:  ZOVIRAX  Apply 1 application topically every 3 (three) hours.     Biotin 10 MG Tabs  Take 10 mg by mouth daily.     gabapentin 800 MG tablet  Commonly known as:  NEURONTIN  Take 1 tablet (800 mg total) by mouth 3 (three) times daily.     nortriptyline 25 MG capsule  Commonly known as:  PAMELOR  Take 25 mg by mouth 3 (three) times daily.     omeprazole 40 MG capsule  Commonly known as:  PRILOSEC  Take 1 capsule (40 mg total) by mouth daily.     OVER THE COUNTER MEDICATION  Take 1 capsule by mouth 2 (two) times daily before a meal. Advance colon care II       Allergies  Allergen Reactions  . Dexamethasone Itching    Itching  . Atorvastatin Nausea And Vomiting  . Claritin [Loratadine]     Hot flashes  . Sulfa Antibiotics Other (See Comments)    GI distress-pain  . Prednisone Rash   Follow-up Information    Follow up with Colin Benton R., DO. Schedule an appointment as  soon as possible for a visit in 1 week.   Specialty:  Family Medicine   Contact information:   [place] [address] [phone]       Follow up with Pricilla Riffle. Fuller Plan, MD.   Specialty:  Gastroenterology   Why:  Please follow up for your endoscopy   Contact information:   [address] [place] [address] [phone]        The results of significant diagnostics from this hospitalization (including imaging, microbiology, ancillary and laboratory) are listed below for reference.    Significant Diagnostic Studies: US Abdomen Complete  06/27/2015   CLINICAL DATA:  59 year old female with nausea and vomiting.  EXAM: ULTRASOUND  ABDOMEN COMPLETE  COMPARISON:  CT the abdomen and pelvis 08/11/2014.  FINDINGS: Gallbladder: No gallstones or wall thickening visualized. No sonographic Murphy sign noted.  Common bile duct: Diameter: 5.9  Liver: No focal lesion identified. Within normal limits in parenchymal echogenicity.  IVC: No abnormality visualized.  Pancreas: Visualized portion unremarkable.  Spleen: Size and appearance within normal limits.  Right Kidney: Length: 9.9 cm. Echogenicity within normal limits. No mass or hydronephrosis visualized.  Left Kidney: Could not be adequately visualized, as a patient was not mobile.  Abdominal aorta: No aneurysm visualized.  Other findings: None.  IMPRESSION: 1. No acute findings to account for the patient's symptoms. 2. Left kidney cannot be visualized.   Electronically Signed   By: Vinnie Langton M.D.   On: 06/27/2015 08:24    Microbiology: No results found for this or any previous visit (from the past 240 hour(s)).   Labs: Basic Metabolic Panel:  Recent Labs Lab 06/26/15 1639 06/26/15 2303 06/27/15 0519 06/27/15 1455 06/28/15 0541  NA 140 143 144 142 143  K 2.3* 2.7* 3.2* 3.2* 3.8  CL 100* 105 109 108 108  CO2 29 31 30 29 29   GLUCOSE 128* 124* 83 79 94  BUN 6 <5* <5* <5* <5*  CREATININE 0.77 0.65 0.58 0.77 0.58  CALCIUM 8.4* 8.0* 7.9* 8.0* 8.4*  MG  --   --  2.3  --   --    Liver Function Tests:  Recent Labs Lab 06/26/15 1639 06/27/15 0519  AST 17 14*  ALT 8* 8*  ALKPHOS 132* 121  BILITOT 0.4 0.2*  PROT 6.5 5.5*  ALBUMIN 3.4* 2.7*    Recent Labs Lab 06/26/15 1639  LIPASE 28   No results for input(s): AMMONIA in the last 168 hours. CBC:  Recent Labs Lab 06/26/15 1639 06/27/15 0519  WBC 5.9 4.9  NEUTROABS  --  3.2  HGB 11.6* 10.4*  HCT 35.5* 32.2*  MCV 94.7 93.9  PLT 402* 352   Cardiac Enzymes: No results for input(s): CKTOTAL, CKMB, CKMBINDEX, TROPONINI in the last 168 hours. BNP: BNP (last 3 results) No results for input(s): BNP in the  last 8760 hours.  ProBNP (last 3 results) No results for input(s): PROBNP in the last 8760 hours.  CBG: No results for input(s): GLUCAP in the last 168 hours.   Signed:  CHIU, Orpah Melter  [facility] 06/28/2015, 6:02 PM

## 2015-06-29 ENCOUNTER — Telehealth: Payer: Self-pay | Admitting: *Deleted

## 2015-06-29 NOTE — Telephone Encounter (Signed)
Transition Care Management Follow-up Telephone Call  How have you been since you were released from the hospital? Feeling better   Do you understand why you were in the hospital? yes   Do you understand the discharge instrcutions? yes  Items Reviewed:  Medications reviewed: yes  Allergies reviewed: yes  Dietary changes reviewed: yes  Referrals reviewed: yes   Functional Questionnaire:   Activities of Daily Living (ADLs):   She states they are independent in the following: ambulation, bathing and hygiene, feeding, continence, grooming, toileting and dressing States they require assistance with the following: none   Any transportation issues/concerns?: no   Any patient concerns? no   Confirmed importance and date/time of follow-up visits scheduled: yes   Confirmed with patient if condition begins to worsen call PCP or go to the ER.  Patient was given the Call-a-Nurse line [phone]: yes   Patient was discharged 06/26/15 Patient was discharged to home Patient has an appointment with Dr Maudie Mercury 07/02/15 at 9 am

## 2015-06-30 LAB — ALDOSTERONE + RENIN ACTIVITY W/ RATIO
ALDO / PRA Ratio: UNDETERMINED
Aldosterone: <1 ng/dL (ref 0.0–30.0)
PRA LC/MS/MS: <0.15 ng/mL/hr

## 2015-07-02 ENCOUNTER — Ambulatory Visit (INDEPENDENT_AMBULATORY_CARE_PROVIDER_SITE_OTHER): Payer: No Typology Code available for payment source | Admitting: Family Medicine

## 2015-07-02 ENCOUNTER — Encounter: Payer: Self-pay | Admitting: Family Medicine

## 2015-07-02 VITALS — BP 100/74 | HR 90 | Temp 98.2°F | Ht 63.0 in | Wt 118.6 lb

## 2015-07-02 DIAGNOSIS — R1110 Vomiting, unspecified: Secondary | ICD-10-CM

## 2015-07-02 DIAGNOSIS — R51 Headache: Secondary | ICD-10-CM

## 2015-07-02 DIAGNOSIS — R946 Abnormal results of thyroid function studies: Secondary | ICD-10-CM

## 2015-07-02 DIAGNOSIS — F39 Unspecified mood [affective] disorder: Secondary | ICD-10-CM | POA: Diagnosis not present

## 2015-07-02 DIAGNOSIS — E538 Deficiency of other specified B group vitamins: Secondary | ICD-10-CM

## 2015-07-02 DIAGNOSIS — R519 Headache, unspecified: Secondary | ICD-10-CM

## 2015-07-02 DIAGNOSIS — E876 Hypokalemia: Secondary | ICD-10-CM

## 2015-07-02 LAB — BASIC METABOLIC PANEL
BUN: 8 mg/dL (ref 6–23)
CALCIUM: 9.1 mg/dL (ref 8.4–10.5)
CO2: 30 mEq/L (ref 19–32)
Chloride: 103 mEq/L (ref 96–112)
Creatinine, Ser: 0.69 mg/dL (ref 0.40–1.20)
GFR: 92.6 mL/min (ref >60.00)
GLUCOSE: 77 mg/dL (ref 70–99)
Potassium: 3.6 mEq/L (ref 3.5–5.1)
Sodium: 141 mEq/L (ref 135–145)

## 2015-07-02 NOTE — Progress Notes (Signed)
HPI:  Transitional care visit. Patient was contacted by our office prior to this appointment to review discharge medications and instructions.  Erin [name] was hospitalized 8/5-06/28/15 for nausea and vomiting for several weeks with with hypokalemia, felt 2ndary to this. She had normal LFTs and [place] in the hospital and per discharge instructions plans to follow up with Dr. Fuller Plan in GI outpatient for endoscopy and further eval of the vomiting. She reports she is still having nausea and vomiting multiple times, several days per week, usually about 4 hours after meals. Reports she has an appointment with Dr. Fuller Plan about this. Denies diarrhea, hematemesis, fevers, abd pain.  Potassium at discharge 3.8 She reports ever since treatment for her anal cancer has had nausea and vomiting and low potassium and has taking potassium intermittently. She is not taking any potassium On several medications for headaches and vertigo with neurologist and for her depression - some of these medications can cause nausea and vomiting - she takes nortriptyline 25 mg once daily, paxil 20mg  daily, neurontin 800mg  tid - she is reluctant to stop these due to itchy nose and headaches when not on these medications.  ROS: See pertinent positives and negatives per HPI.  Past Medical History  Diagnosis Date  . 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 Chronic daily headache 03/29/2013    takes bc powder  . IBS (irritable bowel syndrome) 03/29/2013  . Anxiety   . Depression   . HLD (hyperlipidemia)   . Allergy   . Blood transfusion without reported diagnosis     AS A CHILD  . Tubular adenoma of colon 09/08/2003  . Chronic back pain   . Hot flashes   . Alopecia   . Hx of radiation therapy 09/09/13-10/16/13    anal canal 60.4Gy/78fx  . Cancer 08/14/13    invasive squamous cell ca   . DM (diabetes mellitus)     diet controlled  . History of chemotherapy     hx anal cancer  . GERD (gastroesophageal reflux disease)      Past Surgical History  Procedure Laterality Date  . Ectopic pregnancy surgery    . Pilonidal cyst excision    . Colonoscopy    . Flexible sigmoidoscopy N/A 08/14/2013    Procedure: FLEXIBLE SIGMOIDOSCOPY;  Surgeon: Ladene Artist, MD;  Location: [facility];  Service: Endoscopy;  Laterality: N/A;  . Polypectomy    . Tonsillectomy      Family History  Problem Relation Age of Onset  . Arthritis Mother   . Hyperlipidemia Mother   . Heart disease Mother   . Hypertension Mother   . Stroke Mother 57  . Irritable bowel syndrome Mother   . Thyroid disease Mother   . Heart disease Father   . Hyperlipidemia Father   . Hypertension Father   . Stroke Father 72  . Thyroid disease Father   . Prostate cancer Father   . Lung cancer Brother   . Colon cancer Neg Hx   . Rectal cancer Neg Hx   . Stomach cancer Neg Hx     Social History   Social History  . Marital Status: Married    Spouse Name: N/A  . Number of Children: 0  . Years of Education: N/A   Occupational History  . caregiver    Social History Main Topics  . Smoking status: Former Smoker -- 0.50 packs/day    Types: Cigarettes    Quit date: 10/07/2014  . Smokeless tobacco: Never Used  .  Alcohol Use: No  . Drug Use: No  . Sexual Activity:    Partners: Male   Other Topics Concern  . None   Social History Narrative   Work or School: homemaker      Home Situation: lives with her husband,[name] takes care of her elderly parents       Spiritual Beliefs: Christian      Lifestyle: no regular exercise, poor diet                 Current outpatient prescriptions:  .  acyclovir cream (ZOVIRAX) 5 %, Apply 1 application topically every 3 (three) hours., Disp: 15 g, Rfl: 3 .  Biotin 10 MG TABS, Take 10 mg by mouth daily., Disp: , Rfl:  .  gabapentin (NEURONTIN) 800 MG tablet, Take 1 tablet (800 mg total) by mouth 3 (three) times daily., Disp: 90 tablet, Rfl: 3 .  nortriptyline (PAMELOR) 25 MG capsule, Take 25  mg by mouth 3 (three) times daily., Disp: , Rfl:  .  omeprazole (PRILOSEC) 40 MG capsule, Take 1 capsule (40 mg total) by mouth daily., Disp: 30 capsule, Rfl: 11 .  OVER THE COUNTER MEDICATION, Take 1 capsule by mouth 2 (two) times daily before a meal. Advance colon care II, Disp: , Rfl:  .  PARoxetine (PAXIL) 20 MG tablet, Take 20 mg by mouth daily., Disp: , Rfl:   EXAM:  Filed Vitals:   07/02/15 0854  BP: 100/74  Pulse: 90  Temp: 98.2 F (36.8 C)    Body mass index is 21.01 kg/(m^2).  GENERAL: vitals reviewed and listed above, alert, oriented, appears well hydrated and in no acute distress  HEENT: atraumatic, conjunttiva clear, no obvious abnormalities on inspection of external nose and ears  NECK: no obvious masses on inspection  LUNGS: clear to auscultation bilaterally, no wheezes, rales or rhonchi, good air movement  CV: HRRR, no peripheral edema  MS: moves all extremities without noticeable abnormality  PSYCH: pleasant and cooperative, no obvious depression or anxiety  ASSESSMENT AND PLAN:  Discussed the following assessment and plan:  Intractable vomiting with nausea, vomiting of unspecified type -seeing GI for work up outpt -also discussed possibility of medications contrinuting and would like if she can stop pamalor and lower neurontin, she is anxious about this as headaches improved but she plans to try and follow up with Dr. Delice Lesch for further discussion  Hypokalemia -likely 2ndary to the vomiting -check K+ today, start supplementation if needed  Depression and Anxiety -mood is good, may try weaning of paxil if doing ok  Chronic daily headache -she plans to follow up with Dr. Delice Lesch  Abnormal thyroid function test -repeat in 3 months  B12 deficiency -repeat levels in 3 months  -she will follow up with her gastroenterologist about the nausea and vomiting -hypokal likely due to the excessive vomiting -query combination of gabapentin, pamalor, paxil  contributing as nausea and vomiting are common side effect to several of these medications -blood pressure great today -discussed thyroid labs, plan recheck in 3 months -she is taking b12 - plan to recheck in 3 months -check BMP, potassium supplementation if remains low until nausea/vomiting resolves -Patient advised to return or notify a doctor immediately if symptoms worsen or persist or new concerns arise.  Patient Instructions  BEFORE YOU LEAVE: -labs -follow up in 3 months  Schedule follow up with your neurologist to see if we can reduce and or stop some of your medications for the headaches to see if this  helps your nausea and vomiting  Follow up with your gastroenterologist for the nausea and vomiting as planned     KIM, Jarrett Soho R.

## 2015-07-02 NOTE — Patient Instructions (Signed)
BEFORE YOU LEAVE: -labs -follow up in 3 months  Schedule follow up with your neurologist to see if we can reduce and or stop some of your medications for the headaches to see if this helps your nausea and vomiting  Follow up with your gastroenterologist for the nausea and vomiting as planned

## 2015-07-02 NOTE — Addendum Note (Signed)
Addended by: Elmer Picker on: 07/02/2015 09:58 AM   Modules accepted: Orders

## 2015-07-02 NOTE — Progress Notes (Signed)
Pre visit review using our clinic review tool, if applicable. No additional management support is needed unless otherwise documented below in the visit note. 

## 2015-07-07 ENCOUNTER — Ambulatory Visit (INDEPENDENT_AMBULATORY_CARE_PROVIDER_SITE_OTHER): Payer: No Typology Code available for payment source | Admitting: Neurology

## 2015-07-07 ENCOUNTER — Encounter: Payer: Self-pay | Admitting: Neurology

## 2015-07-07 VITALS — BP 112/74 | HR 89 | Resp 16 | Ht 63.0 in | Wt 115.0 lb

## 2015-07-07 DIAGNOSIS — M542 Cervicalgia: Secondary | ICD-10-CM

## 2015-07-07 DIAGNOSIS — H8110 Benign paroxysmal vertigo, unspecified ear: Secondary | ICD-10-CM | POA: Diagnosis not present

## 2015-07-07 DIAGNOSIS — R51 Headache: Secondary | ICD-10-CM | POA: Diagnosis not present

## 2015-07-07 DIAGNOSIS — R2681 Unsteadiness on feet: Secondary | ICD-10-CM | POA: Diagnosis not present

## 2015-07-07 DIAGNOSIS — R519 Headache, unspecified: Secondary | ICD-10-CM

## 2015-07-07 NOTE — Patient Instructions (Signed)
1. Refer for Vestibular and Balance Therapy 2. Continue nortriptyline 75mg  daily and Gabapentin 800mg  three times a day 3. Use cane at all times as long as walking is unsteady 4. Discuss thyroid test and spinal injections with Dr. Maudie Mercury 5. Follow-up in 3-4 months
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
## 2015-07-08 ENCOUNTER — Encounter: Payer: Self-pay | Admitting: Neurology

## 2015-07-08 DIAGNOSIS — M542 Cervicalgia: Secondary | ICD-10-CM

## 2015-07-08 DIAGNOSIS — H8110 Benign paroxysmal vertigo, unspecified ear: Secondary | ICD-10-CM | POA: Insufficient documentation

## 2015-07-08 HISTORY — DX: Benign paroxysmal vertigo, unspecified ear: H81.10

## 2015-07-08 HISTORY — DX: Cervicalgia: M54.2

## 2015-07-20 ENCOUNTER — Ambulatory Visit (AMBULATORY_SURGERY_CENTER): Payer: No Typology Code available for payment source | Admitting: Gastroenterology

## 2015-07-20 ENCOUNTER — Encounter: Payer: Self-pay | Admitting: Gastroenterology

## 2015-07-20 VITALS — BP 133/84 | HR 81 | Temp 98.0°F | Resp 20 | Ht 63.0 in | Wt 117.0 lb

## 2015-07-20 DIAGNOSIS — R1110 Vomiting, unspecified: Secondary | ICD-10-CM

## 2015-07-20 DIAGNOSIS — R112 Nausea with vomiting, unspecified: Secondary | ICD-10-CM

## 2015-07-20 DIAGNOSIS — K2970 Gastritis, unspecified, without bleeding: Secondary | ICD-10-CM | POA: Diagnosis not present

## 2015-07-20 MED ORDER — DICYCLOMINE HCL 10 MG PO CAPS: 10.0000 mg | ORAL_CAPSULE | Freq: Three times a day (TID) | ORAL | Status: DC

## 2015-07-20 MED ORDER — SODIUM CHLORIDE 0.9 % IV SOLN: 500.0000 mL | INTRAVENOUS | Status: DC

## 2015-07-20 NOTE — Progress Notes (Signed)
Called to room to assist during endoscopic procedure.  Patient ID and intended procedure confirmed with present staff. Received instructions for my participation in the procedure from the performing physician.  

## 2015-07-20 NOTE — Patient Instructions (Signed)
YOU HAD AN ENDOSCOPIC PROCEDURE TODAY AT [facility]:   Refer to the procedure report that was given to you for any specific questions about what was found during the examination.  If the procedure report does not answer your questions, please call your gastroenterologist to clarify.  If you requested that your care partner not be given the details of your procedure findings, then the procedure report has been included in a sealed envelope for you to review at your convenience later.  YOU SHOULD EXPECT: Some feelings of bloating in the abdomen. Passage of more gas than usual.  Walking can help get rid of the air that was put into your GI tract during the procedure and reduce the bloating. If you had a lower endoscopy (such as a colonoscopy or flexible sigmoidoscopy) you may notice spotting of blood in your stool or on the toilet paper. If you underwent a bowel prep for your procedure, you may not have a normal bowel movement for a few days.  Please Note:  You might notice some irritation and congestion in your nose or some drainage.  This is from the oxygen used during your procedure.  There is no need for concern and it should clear up in a day or so.  SYMPTOMS TO REPORT IMMEDIATELY:  F  Following upper endoscopy (EGD)  Vomiting of blood or coffee ground material  New chest pain or pain under the shoulder blades  Painful or persistently difficult swallowing  New shortness of breath  Fever of 100F or higher  Black, tarry-looking stools  For urgent or emergent issues, a gastroenterologist can be reached at any hour by calling [phone].   DIET: Your first meal following the procedure should be a small meal and then it is ok to progress to your normal diet. Heavy or fried foods are harder to digest and may make you feel nauseous or bloated.  Likewise, meals heavy in dairy and vegetables can increase bloating.  Drink plenty of fluids but you should avoid alcoholic beverages  for 24 hours.  ACTIVITY:  You should plan to take it easy for the rest of today and you should NOT DRIVE or use heavy machinery until tomorrow (because of the sedation medicines used during the test).    FOLLOW UP: Our staff will call the number listed on your records the next business day following your procedure to check on you and address any questions or concerns that you may have regarding the information given to you following your procedure. If we do not reach you, we will leave a message.  However, if you are feeling well and you are not experiencing any problems, there is no need to return our call.  We will assume that you have returned to your regular daily activities without incident.  If any biopsies were taken you will be contacted by phone or by letter within the next 1-3 weeks.  Please call us at [phone] if you have not heard about the biopsies in 3 weeks.    SIGNATURES/CONFIDENTIALITY: You and/or your care partner have signed paperwork which will be entered into your electronic medical record.  These signatures attest to the fact that that the information above on your After Visit Summary has been reviewed and is understood.  Full responsibility of the confidentiality of this discharge information lies with you and/or your care-partner.

## 2015-07-20 NOTE — Op Note (Signed)
[facility]  [address] [place], [address]   ENDOSCOPY PROCEDURE REPORT  PATIENT: Erin, [name]  MR#: [id number] BIRTHDATE: 1956-01-24 , 58  yrs. old GENDER: female ENDOSCOPIST: Ladene Artist, MD, [facility] REFERRED BY:  Colin Benton, D.O. PROCEDURE DATE:  07/20/2015 PROCEDURE:  EGD w/ biopsy ASA CLASS:     Class II INDICATIONS:  nausea and vomiting. MEDICATIONS: Monitored anesthesia care and Propofol 130 mg IV TOPICAL ANESTHETIC: none DESCRIPTION OF PROCEDURE: After the risks benefits and alternatives of the procedure were thoroughly explained, informed consent was obtained.  The LB [id number] endoscope was introduced through the mouth and advanced to the second portion of the duodenum , Without limitations.  The instrument was slowly withdrawn as the mucosa was fully examined.    STOMACH: Gastritis, non erosive, was found in the gastric antrum and gastric body.  Multiple biopsies were performed.  Small AVM in gastric body, posterior wall. The stomach otherwise appeared normal. ESOPHAGUS: The mucosa of the esophagus appeared normal. DUODENUM: The duodenal mucosa showed no abnormalities in the bulb and 2nd part of the duodenum.  Retroflexed views revealed a small hiatal hernia.   The scope was then withdrawn from the patient and the procedure completed.  COMPLICATIONS: There were no immediate complications.  ENDOSCOPIC IMPRESSION: 1.   Gastritis in the gastric antrum and gastric body; multiple biopsies performed 2.   Small hiatal hernai 3.   The EGD otherwise appeared normal  RECOMMENDATIONS: 1.  Await pathology results 2.  Anti-reflux regimen 3.  Continue PPI daily. Begin dicylcomine 10 mg tid ac, 3 months of refills 4.  Schedule Gastric Emptying Scan.  This is a radiology test that gives an idea of how well your stomach functions.  eSigned:  Ladene Artist, MD, [facility] 07/20/2015 10:33 AM

## 2015-07-20 NOTE — Progress Notes (Signed)
To recovery, report to Tyrell, RN, VSS. 

## 2015-07-21 ENCOUNTER — Telehealth: Payer: Self-pay

## 2015-07-21 NOTE — Telephone Encounter (Signed)
  Follow up Call-  Call back number 07/20/2015 09/23/2014 03/10/2014 08/05/2013  Post procedure Call Back phone  # [phone]  Permission to leave phone message Yes Yes Yes Yes     Patient questions:  Do you have a fever, pain , or abdominal swelling? No. Pain Score  0 *  Have you tolerated food without any problems? Yes.    Have you been able to return to your normal activities? No.  Do you have any questions about your discharge instructions: Diet   No. Medications  No. Follow up visit  No.  Do you have questions or concerns about your Care? No.  Actions: * If pain score is 4 or above: No action needed, pain <4.   Per stated she was "still bloated from her rib cage to her belly button".  She was given rx and also to have a gastric emptying scan to be done.  I advised her the 3 rd floor nurse will call to set that appointment up.  Pt thanked [place] for "being so good to her". maw

## 2015-07-22 ENCOUNTER — Telehealth: Payer: Self-pay

## 2015-07-22 DIAGNOSIS — R112 Nausea with vomiting, unspecified: Secondary | ICD-10-CM

## 2015-07-22 NOTE — Telephone Encounter (Signed)
Per procedure report 07/20/15 patient needs GES She is scheduled at [facility] for 08/06/15 7:30 am.  Needs to hold all GI meds for 24 hour prior and be NPO for 8 hours.   Left message for patient to call back

## 2015-07-23 ENCOUNTER — Encounter: Payer: Self-pay | Admitting: Gastroenterology

## 2015-07-23 NOTE — Telephone Encounter (Signed)
Patient notified  All questions answered.  She verbalized understanding of all instructions

## 2015-07-29 ENCOUNTER — Telehealth: Payer: Self-pay

## 2015-07-29 NOTE — Telephone Encounter (Signed)
The pt called and is hoping to get repeat labs.  She is concerned her potassium is low again.  Pt callback - [phone]

## 2015-07-30 NOTE — Telephone Encounter (Signed)
I called the pt and she stated she is having problems walking and she noticed this when her potassium was low before and has had this since her chemo.  States she was seen by the neurologist for this also and she felt her symptoms were due to neuropathy and Dr Delice Lesch wanted Dr Maudie Mercury to order lab tests also and other tests.  I offered the pt an appt to see Dr Maudie Mercury on Monday as she is out of the office today and tomorrow and she asked if she could see the doctor that called her last time when her potassium was low.  I advised her I can check with Dr Elease Hashimoto to see what he recommends but he may want her to see Dr Maudie Mercury as she knows her history and she stated she will just see Dr Maudie Mercury on Monday and an appt was scheduled for 1:30pm.

## 2015-08-03 ENCOUNTER — Ambulatory Visit (INDEPENDENT_AMBULATORY_CARE_PROVIDER_SITE_OTHER): Payer: No Typology Code available for payment source | Admitting: Family Medicine

## 2015-08-03 ENCOUNTER — Encounter: Payer: Self-pay | Admitting: Family Medicine

## 2015-08-03 VITALS — BP 102/64 | HR 112 | Temp 98.5°F | Ht 63.0 in | Wt 117.0 lb

## 2015-08-03 DIAGNOSIS — M5441 Lumbago with sciatica, right side: Secondary | ICD-10-CM

## 2015-08-03 DIAGNOSIS — M542 Cervicalgia: Secondary | ICD-10-CM

## 2015-08-03 DIAGNOSIS — R946 Abnormal results of thyroid function studies: Secondary | ICD-10-CM

## 2015-08-03 DIAGNOSIS — E538 Deficiency of other specified B group vitamins: Secondary | ICD-10-CM

## 2015-08-03 DIAGNOSIS — E039 Hypothyroidism, unspecified: Secondary | ICD-10-CM

## 2015-08-03 DIAGNOSIS — E876 Hypokalemia: Secondary | ICD-10-CM | POA: Diagnosis not present

## 2015-08-03 DIAGNOSIS — E038 Other specified hypothyroidism: Secondary | ICD-10-CM

## 2015-08-03 DIAGNOSIS — Z23 Encounter for immunization: Secondary | ICD-10-CM

## 2015-08-03 LAB — TSH: TSH: 6.48 u[IU]/mL — AB (ref 0.35–4.50)

## 2015-08-03 LAB — VITAMIN B12: Vitamin B-12: 326 pg/mL (ref 211–911)

## 2015-08-03 LAB — POTASSIUM: POTASSIUM: 4 meq/L (ref 3.5–5.1)

## 2015-08-03 LAB — T4, FREE: FREE T4: 1.24 ng/dL (ref 0.60–1.60)

## 2015-08-03 NOTE — Progress Notes (Signed)
HPI:  Acute visit for:  Bilateral hip pain: -started 1-2 years ago but worsening -bilat low back R>L, intermittent achy pain that radiates to R>L buttock and posterior upper leg -denies: weakness, numbness, fevers, malaise, trauma -reports hx DDD neck and back and chronic neck pain as well, R>L but does not think has had xrays, remote MRI of lumbar and cervical spine reports reviewed with DDD  Follow up several chronic issues:  Hypokalemia: -in the past and felt due to recurrent emesis - she is seeing GI for this, GES scheduled for later the week -still throwing up 2-3 times per week -wants to check potassium -denies: diarrhea, melena, hematochezia  Mild abnormal TFT/B12 def: -plan to recheck these labs next month, but she wants to do today since here -denies: weight changes, palpitation, constipation, paresthesias, weakness, numbness  ROS: See pertinent positives and negatives per HPI.  Past Medical History  Diagnosis Date  . 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 Chronic daily headache 03/29/2013    takes bc powder  . IBS (irritable bowel syndrome) 03/29/2013  . Anxiety   . Depression   . HLD (hyperlipidemia)   . Allergy   . Blood transfusion without reported diagnosis     AS A CHILD  . Tubular adenoma of colon 09/08/2003  . Chronic back pain   . Hot flashes   . Alopecia   . Hx of radiation therapy 09/09/13-10/16/13    anal canal 60.4Gy/18fx  . Cancer 08/14/13    invasive squamous cell ca   . DM (diabetes mellitus)     diet controlled  . History of chemotherapy     hx anal cancer  . GERD (gastroesophageal reflux disease)     Past Surgical History  Procedure Laterality Date  . Ectopic pregnancy surgery    . Pilonidal cyst excision    . Colonoscopy    . Flexible sigmoidoscopy N/A 08/14/2013    Procedure: FLEXIBLE SIGMOIDOSCOPY;  Surgeon: Ladene Artist, MD;  Location: [facility];  Service: Endoscopy;  Laterality: N/A;  . Polypectomy    . Tonsillectomy       Family History  Problem Relation Age of Onset  . Arthritis Mother   . Hyperlipidemia Mother   . Heart disease Mother   . Hypertension Mother   . Stroke Mother 53  . Irritable bowel syndrome Mother   . Thyroid disease Mother   . Heart disease Father   . Hyperlipidemia Father   . Hypertension Father   . Stroke Father 73  . Thyroid disease Father   . Prostate cancer Father   . Lung cancer Brother   . Colon cancer Neg Hx   . Rectal cancer Neg Hx   . Stomach cancer Neg Hx     Social History   Social History  . Marital Status: Married    Spouse Name: N/A  . Number of Children: 0  . Years of Education: N/A   Occupational History  . caregiver    Social History Main Topics  . Smoking status: Former Smoker -- 0.50 packs/day    Types: Cigarettes    Quit date: 10/07/2014  . Smokeless tobacco: Never Used  . Alcohol Use: No  . Drug Use: No  . Sexual Activity:    Partners: Male   Other Topics Concern  . None   Social History Narrative   Work or School: homemaker      Home Situation: lives with her husband,[name] takes care of her elderly parents  Spiritual Beliefs: Christian      Lifestyle: no regular exercise, poor diet                 Current outpatient prescriptions:  .  Biotin 10 MG TABS, Take 10 mg by mouth daily., Disp: , Rfl:  .  dicyclomine (BENTYL) 10 MG capsule, Take 1 capsule (10 mg total) by mouth 3 (three) times daily before meals., Disp: 30 capsule, Rfl: 3 .  gabapentin (NEURONTIN) 800 MG tablet, Take 1 tablet (800 mg total) by mouth 3 (three) times daily., Disp: 90 tablet, Rfl: 3 .  nortriptyline (PAMELOR) 25 MG capsule, Takes 3 capsules daily, Disp: , Rfl:  .  omeprazole (PRILOSEC) 40 MG capsule, Take 1 capsule (40 mg total) by mouth daily., Disp: 30 capsule, Rfl: 11 .  OVER THE COUNTER MEDICATION, Take 1 capsule by mouth 2 (two) times daily before a meal. Advance colon care II, Disp: , Rfl:  .  PARoxetine (PAXIL) 20 MG tablet, Take 20  mg by mouth daily., Disp: , Rfl:   EXAM:  Filed Vitals:   08/03/15 1342  BP: 102/64  Pulse: 112  Temp: 98.5 F (36.9 C)    Body mass index is 20.73 kg/(m^2).  GENERAL: vitals reviewed and listed above, alert, oriented, appears well hydrated and in no acute distress  HEENT: atraumatic, conjunttiva clear, no obvious abnormalities on inspection of external nose and ears  NECK: no obvious masses on inspection  LUNGS: clear to auscultation bilaterally, no wheezes, rales or rhonchi, good air movement  CV: HRRR, no peripheral edema  MS: moves all extremities without noticeable abnormality Normal Gait Normal inspection of back, no obvious scoliosis or leg length descrepancy No bony TTP Soft tissue TTP at: R>L lumbar paraspinal muscles and over R piriformis region -/+ tests: neg trendelenburg,-facet loading, -SLRT, -CLRT, -FABER, -FADIR Normal muscle strength, sensation to light touch and DTRs in LEs bilaterally  PSYCH: pleasant and cooperative, no obvious depression or anxiety  ASSESSMENT AND PLAN:  Discussed the following assessment and plan:  Right-sided low back pain with right-sided sciatica - Plan: DG Lumbar Spine Complete Neck pain - Plan: DG Cervical Spine Complete -suspect DDD with radicular symptoms, no neurodeficits on exam -plain films and HEP, may consider PMR referral, prednisone pending findings   Hypokalemia - Plan: Potassium  Borderline abnormal TFTs - Plan: TSH, T4, Free  B12 deficiency - Plan: Vitamin B12    -Patient advised to return or notify a doctor immediately if symptoms worsen or persist or new concerns arise.  Patient Instructions  BEFORE YOU LEAVE: -labs -flu shot -low back exercises -xray sheet -follow up in 3-4 months  For the hip and leg pain: -lets get some xrays of the back and work on the low back exercises -if persists I think it would be good for you to see specialist - please let us know and we can place a referral  -We have  ordered labs or studies at this visit. It can take up to 1-2 weeks for results and processing. We will contact you with instructions IF your results are abnormal. Normal results will be released to your [facility]. If you have not heard from [place] or can not find your results in [facility] in 2 weeks please contact our office.           Colin Benton R.

## 2015-08-03 NOTE — Patient Instructions (Signed)
BEFORE YOU LEAVE: -labs -flu shot -low back exercises -xray sheet -follow up in 3-4 months  For the hip and leg pain: -lets get some xrays of the back and work on the low back exercises -if persists I think it would be good for you to see specialist - please let us know and we can place a referral  -We have ordered labs or studies at this visit. It can take up to 1-2 weeks for results and processing. We will contact you with instructions IF your results are abnormal. Normal results will be released to your [facility]. If you have not heard from [place] or can not find your results in [facility] in 2 weeks please contact our office.

## 2015-08-03 NOTE — Progress Notes (Signed)
Pre visit review using our clinic review tool, if applicable. No additional management support is needed unless otherwise documented below in the visit note. 

## 2015-08-06 ENCOUNTER — Ambulatory Visit (HOSPITAL_COMMUNITY)
Admission: RE | Admit: 2015-08-06 | Discharge: 2015-08-06 | Disposition: A | Discharge status: Home / Self Care | Payer: No Typology Code available for payment source | Source: Ambulatory Visit | Attending: Gastroenterology | Admitting: Gastroenterology

## 2015-08-06 DIAGNOSIS — R6881 Early satiety: Secondary | ICD-10-CM | POA: Insufficient documentation

## 2015-08-06 DIAGNOSIS — R112 Nausea with vomiting, unspecified: Secondary | ICD-10-CM | POA: Diagnosis present

## 2015-08-06 MED ORDER — LEVOTHYROXINE SODIUM 25 MCG PO TABS: 25.0000 ug | ORAL_TABLET | Freq: Every day | ORAL | Status: DC

## 2015-08-06 MED ORDER — TECHNETIUM TC 99M SULFUR COLLOID
1.9000 | Freq: Once | INTRAVENOUS | Status: DC | PRN
  Administered 2015-08-06: 1.9 via INTRAVENOUS
  Filled 2015-08-06: qty 1.9

## 2015-08-06 NOTE — Addendum Note (Signed)
Addended by: Agnes Lawrence on: 08/06/2015 01:42 PM   Modules accepted: Orders

## 2015-08-06 NOTE — Addendum Note (Signed)
Addended by: Agnes Lawrence on: 08/06/2015 03:34 PM   Modules accepted: Orders

## 2015-08-14 ENCOUNTER — Other Ambulatory Visit: Payer: Self-pay | Admitting: Family Medicine

## 2015-08-14 ENCOUNTER — Ambulatory Visit (INDEPENDENT_AMBULATORY_CARE_PROVIDER_SITE_OTHER)
Admission: RE | Admit: 2015-08-14 | Discharge: 2015-08-14 | Disposition: A | Discharge status: Home / Self Care | Payer: No Typology Code available for payment source | Source: Ambulatory Visit | Attending: Family Medicine | Admitting: Family Medicine

## 2015-08-14 ENCOUNTER — Other Ambulatory Visit: Payer: Self-pay | Admitting: *Deleted

## 2015-08-14 DIAGNOSIS — M542 Cervicalgia: Secondary | ICD-10-CM

## 2015-08-14 DIAGNOSIS — M5441 Lumbago with sciatica, right side: Secondary | ICD-10-CM | POA: Diagnosis not present

## 2015-08-14 MED ORDER — PREDNISONE 10 MG PO TABS: ORAL_TABLET | ORAL | Status: DC

## 2015-08-24 ENCOUNTER — Ambulatory Visit: Payer: No Typology Code available for payment source | Admitting: Physical Therapy

## 2015-09-07 ENCOUNTER — Ambulatory Visit: Payer: No Typology Code available for payment source

## 2015-09-10 ENCOUNTER — Emergency Department (HOSPITAL_COMMUNITY): Payer: No Typology Code available for payment source

## 2015-09-10 ENCOUNTER — Inpatient Hospital Stay (HOSPITAL_COMMUNITY)
Admission: EM | Admit: 2015-09-10 | Discharge: 2015-09-14 | DRG: 470 | Disposition: A | Discharge status: Home Health | Payer: No Typology Code available for payment source | Attending: Internal Medicine | Admitting: Internal Medicine

## 2015-09-10 ENCOUNTER — Encounter (HOSPITAL_COMMUNITY): Payer: Self-pay | Admitting: Emergency Medicine

## 2015-09-10 DIAGNOSIS — K589 Irritable bowel syndrome without diarrhea: Secondary | ICD-10-CM | POA: Diagnosis present

## 2015-09-10 DIAGNOSIS — F329 Major depressive disorder, single episode, unspecified: Secondary | ICD-10-CM | POA: Diagnosis present

## 2015-09-10 DIAGNOSIS — Z79899 Other long term (current) drug therapy: Secondary | ICD-10-CM | POA: Diagnosis not present

## 2015-09-10 DIAGNOSIS — F419 Anxiety disorder, unspecified: Secondary | ICD-10-CM | POA: Diagnosis present

## 2015-09-10 DIAGNOSIS — D62 Acute posthemorrhagic anemia: Secondary | ICD-10-CM | POA: Diagnosis not present

## 2015-09-10 DIAGNOSIS — R509 Fever, unspecified: Secondary | ICD-10-CM | POA: Diagnosis not present

## 2015-09-10 DIAGNOSIS — Z888 Allergy status to other drugs, medicaments and biological substances status: Secondary | ICD-10-CM | POA: Diagnosis not present

## 2015-09-10 DIAGNOSIS — E119 Type 2 diabetes mellitus without complications: Secondary | ICD-10-CM | POA: Diagnosis present

## 2015-09-10 DIAGNOSIS — E440 Moderate protein-calorie malnutrition: Secondary | ICD-10-CM | POA: Diagnosis present

## 2015-09-10 DIAGNOSIS — Z882 Allergy status to sulfonamides status: Secondary | ICD-10-CM | POA: Diagnosis not present

## 2015-09-10 DIAGNOSIS — D72829 Elevated white blood cell count, unspecified: Secondary | ICD-10-CM | POA: Diagnosis not present

## 2015-09-10 DIAGNOSIS — E785 Hyperlipidemia, unspecified: Secondary | ICD-10-CM | POA: Diagnosis present

## 2015-09-10 DIAGNOSIS — I9589 Other hypotension: Secondary | ICD-10-CM | POA: Diagnosis not present

## 2015-09-10 DIAGNOSIS — D519 Vitamin B12 deficiency anemia, unspecified: Secondary | ICD-10-CM | POA: Diagnosis present

## 2015-09-10 DIAGNOSIS — Z96649 Presence of unspecified artificial hip joint: Secondary | ICD-10-CM

## 2015-09-10 DIAGNOSIS — S72011A Unspecified intracapsular fracture of right femur, initial encounter for closed fracture: Principal | ICD-10-CM | POA: Diagnosis present

## 2015-09-10 DIAGNOSIS — K219 Gastro-esophageal reflux disease without esophagitis: Secondary | ICD-10-CM | POA: Diagnosis present

## 2015-09-10 DIAGNOSIS — Z87891 Personal history of nicotine dependence: Secondary | ICD-10-CM

## 2015-09-10 DIAGNOSIS — E876 Hypokalemia: Secondary | ICD-10-CM | POA: Diagnosis not present

## 2015-09-10 DIAGNOSIS — S72001A Fracture of unspecified part of neck of right femur, initial encounter for closed fracture: Secondary | ICD-10-CM

## 2015-09-10 DIAGNOSIS — F39 Unspecified mood [affective] disorder: Secondary | ICD-10-CM

## 2015-09-10 DIAGNOSIS — I959 Hypotension, unspecified: Secondary | ICD-10-CM | POA: Diagnosis not present

## 2015-09-10 DIAGNOSIS — E538 Deficiency of other specified B group vitamins: Secondary | ICD-10-CM | POA: Diagnosis not present

## 2015-09-10 DIAGNOSIS — L280 Lichen simplex chronicus: Secondary | ICD-10-CM | POA: Diagnosis present

## 2015-09-10 DIAGNOSIS — Y92007 Garden or yard of unspecified non-institutional (private) residence as the place of occurrence of the external cause: Secondary | ICD-10-CM

## 2015-09-10 DIAGNOSIS — W010XXA Fall on same level from slipping, tripping and stumbling without subsequent striking against object, initial encounter: Secondary | ICD-10-CM | POA: Diagnosis present

## 2015-09-10 DIAGNOSIS — E039 Hypothyroidism, unspecified: Secondary | ICD-10-CM

## 2015-09-10 DIAGNOSIS — S72001D Fracture of unspecified part of neck of right femur, subsequent encounter for closed fracture with routine healing: Secondary | ICD-10-CM | POA: Diagnosis not present

## 2015-09-10 HISTORY — DX: Hypothyroidism, unspecified: E03.9

## 2015-09-10 HISTORY — DX: Fracture of unspecified part of neck of right femur, initial encounter for closed fracture: S72.001A

## 2015-09-10 LAB — CBC
HCT: 34.4 % — ABNORMAL LOW (ref 36.0–46.0)
HEMATOCRIT: 34.6 % — AB (ref 36.0–46.0)
HEMOGLOBIN: 11.4 g/dL — AB (ref 12.0–15.0)
HEMOGLOBIN: 11.4 g/dL — AB (ref 12.0–15.0)
MCH: 30.5 pg (ref 26.0–34.0)
MCH: 31 pg (ref 26.0–34.0)
MCHC: 32.9 g/dL (ref 30.0–36.0)
MCHC: 33.1 g/dL (ref 30.0–36.0)
MCV: 92.5 fL (ref 78.0–100.0)
MCV: 93.5 fL (ref 78.0–100.0)
Platelets: 349 10*3/uL (ref 150–400)
Platelets: 290 10*3/uL (ref 150–400)
RBC: 3.74 MIL/uL — ABNORMAL LOW (ref 3.87–5.11)
RBC: 3.68 MIL/uL — AB (ref 3.87–5.11)
RDW: 14.3 % (ref 11.5–15.5)
RDW: 14.3 % (ref 11.5–15.5)
WBC: 12.2 10*3/uL — ABNORMAL HIGH (ref 4.0–10.5)
WBC: 9.9 10*3/uL (ref 4.0–10.5)

## 2015-09-10 LAB — COMPREHENSIVE METABOLIC PANEL
ALK PHOS: 131 U/L — AB (ref 38–126)
ALT: 12 U/L — ABNORMAL LOW (ref 14–54)
ANION GAP: 9 (ref 5–15)
AST: 17 U/L (ref 15–41)
Albumin: 4 g/dL (ref 3.5–5.0)
BILIRUBIN TOTAL: 0.4 mg/dL (ref 0.3–1.2)
BUN: 7 mg/dL (ref 6–20)
CALCIUM: 8.8 mg/dL — AB (ref 8.9–10.3)
CO2: 26 mmol/L (ref 22–32)
Chloride: 103 mmol/L (ref 101–111)
Creatinine, Ser: 0.69 mg/dL (ref 0.44–1.00)
GLUCOSE: 125 mg/dL — AB (ref 65–99)
Potassium: 3.4 mmol/L — ABNORMAL LOW (ref 3.5–5.1)
Sodium: 138 mmol/L (ref 135–145)
TOTAL PROTEIN: 7.2 g/dL (ref 6.5–8.1)

## 2015-09-10 LAB — CREATININE, SERUM: CREATININE: 0.74 mg/dL (ref 0.44–1.00)

## 2015-09-10 LAB — VITAMIN B12: Vitamin B-12: 385 pg/mL (ref 180–914)

## 2015-09-10 LAB — SURGICAL PCR SCREEN
MRSA, PCR: NEGATIVE
Staphylococcus aureus: NEGATIVE

## 2015-09-10 LAB — GLUCOSE, CAPILLARY: GLUCOSE-CAPILLARY: 120 mg/dL — AB (ref 65–99)

## 2015-09-10 LAB — TSH: TSH: 4.593 u[IU]/mL — AB (ref 0.350–4.500)

## 2015-09-10 MED ORDER — ONDANSETRON HCL 4 MG/2ML IJ SOLN: 4.0000 mg | Freq: Three times a day (TID) | INTRAMUSCULAR | Status: AC | PRN

## 2015-09-10 MED ORDER — METHOCARBAMOL 500 MG PO TABS
500.0000 mg | ORAL_TABLET | Freq: Four times a day (QID) | ORAL | Status: DC | PRN
  Administered 2015-09-12 – 2015-09-14 (×4): 500 mg via ORAL
  Filled 2015-09-10 (×4): qty 1

## 2015-09-10 MED ORDER — INSULIN ASPART 100 UNIT/ML ~~LOC~~ SOLN: 0.0000 [IU] | Freq: Three times a day (TID) | SUBCUTANEOUS | Status: DC

## 2015-09-10 MED ORDER — HYDROMORPHONE HCL 1 MG/ML IJ SOLN
0.5000 mg | Freq: Once | INTRAMUSCULAR | Status: AC
  Administered 2015-09-10: 0.5 mg via INTRAVENOUS
  Filled 2015-09-10: qty 1

## 2015-09-10 MED ORDER — BISACODYL 5 MG PO TBEC
5.0000 mg | DELAYED_RELEASE_TABLET | Freq: Every day | ORAL | Status: DC
  Administered 2015-09-12 – 2015-09-13 (×2): 10 mg via ORAL
  Administered 2015-09-14: 5 mg via ORAL
  Filled 2015-09-10: qty 2
  Filled 2015-09-10: qty 1
  Filled 2015-09-10: qty 2

## 2015-09-10 MED ORDER — DICYCLOMINE HCL 10 MG PO CAPS
10.0000 mg | ORAL_CAPSULE | Freq: Three times a day (TID) | ORAL | Status: DC
  Administered 2015-09-11 – 2015-09-14 (×8): 10 mg via ORAL
  Filled 2015-09-10 (×9): qty 1

## 2015-09-10 MED ORDER — NORTRIPTYLINE HCL 25 MG PO CAPS
25.0000 mg | ORAL_CAPSULE | Freq: Three times a day (TID) | ORAL | Status: DC
  Administered 2015-09-10 – 2015-09-14 (×10): 25 mg via ORAL
  Filled 2015-09-10 (×15): qty 1

## 2015-09-10 MED ORDER — SODIUM CHLORIDE 0.9 % IV SOLN
INTRAVENOUS | Status: DC
Start: 2015-09-10 — End: 2015-09-11

## 2015-09-10 MED ORDER — HYDROCODONE-ACETAMINOPHEN 5-325 MG PO TABS
1.0000 | ORAL_TABLET | ORAL | Status: AC | PRN
  Administered 2015-09-10: 2 via ORAL
  Filled 2015-09-10: qty 2

## 2015-09-10 MED ORDER — GABAPENTIN 800 MG PO TABS
800.0000 mg | ORAL_TABLET | Freq: Three times a day (TID) | ORAL | Status: DC
  Filled 2015-09-10: qty 1

## 2015-09-10 MED ORDER — FENTANYL CITRATE (PF) 100 MCG/2ML IJ SOLN
50.0000 ug | Freq: Once | INTRAMUSCULAR | Status: AC
  Administered 2015-09-10: 50 ug via INTRAVENOUS
  Filled 2015-09-10: qty 2

## 2015-09-10 MED ORDER — POLYETHYLENE GLYCOL 3350 17 G PO PACK
17.0000 g | PACK | Freq: Every day | ORAL | Status: DC | PRN
Start: 2015-09-10 — End: 2015-09-14

## 2015-09-10 MED ORDER — LORAZEPAM 2 MG/ML IJ SOLN
0.5000 mg | Freq: Once | INTRAMUSCULAR | Status: AC
Start: 2015-09-10 — End: 2015-09-10
  Administered 2015-09-10: 0.5 mg via INTRAVENOUS
  Filled 2015-09-10: qty 1

## 2015-09-10 MED ORDER — PANTOPRAZOLE SODIUM 40 MG PO TBEC
40.0000 mg | DELAYED_RELEASE_TABLET | Freq: Every day | ORAL | Status: DC
  Administered 2015-09-12 – 2015-09-14 (×3): 40 mg via ORAL
  Filled 2015-09-10 (×3): qty 1

## 2015-09-10 MED ORDER — HYDROMORPHONE HCL 1 MG/ML IJ SOLN
1.0000 mg | Freq: Once | INTRAMUSCULAR | Status: AC
  Administered 2015-09-10: 1 mg via INTRAVENOUS
  Filled 2015-09-10: qty 1

## 2015-09-10 MED ORDER — POTASSIUM CHLORIDE CRYS ER 20 MEQ PO TBCR
40.0000 meq | EXTENDED_RELEASE_TABLET | ORAL | Status: AC
  Administered 2015-09-10: 40 meq via ORAL
  Filled 2015-09-10: qty 2

## 2015-09-10 MED ORDER — FLUTICASONE PROPIONATE 50 MCG/ACT NA SUSP
1.0000 | Freq: Every day | NASAL | Status: DC
  Administered 2015-09-12 – 2015-09-14 (×3): 1 via NASAL
  Filled 2015-09-10 (×2): qty 16

## 2015-09-10 MED ORDER — ACETAMINOPHEN 500 MG PO TABS
1000.0000 mg | ORAL_TABLET | Freq: Once | ORAL | Status: AC
  Administered 2015-09-11: 1000 mg via ORAL
  Filled 2015-09-10: qty 2

## 2015-09-10 MED ORDER — MORPHINE SULFATE (PF) 2 MG/ML IV SOLN: 0.5000 mg | INTRAVENOUS | Status: DC | PRN

## 2015-09-10 MED ORDER — PAROXETINE HCL 20 MG PO TABS
20.0000 mg | ORAL_TABLET | Freq: Every day | ORAL | Status: DC
  Administered 2015-09-10 – 2015-09-14 (×4): 20 mg via ORAL
  Filled 2015-09-10 (×4): qty 1

## 2015-09-10 MED ORDER — HEPARIN SODIUM (PORCINE) 5000 UNIT/ML IJ SOLN
5000.0000 [IU] | Freq: Three times a day (TID) | INTRAMUSCULAR | Status: DC
  Administered 2015-09-10 – 2015-09-11 (×2): 5000 [IU] via SUBCUTANEOUS
  Filled 2015-09-10 (×2): qty 1

## 2015-09-10 MED ORDER — CHLORHEXIDINE GLUCONATE 4 % EX LIQD
60.0000 mL | Freq: Once | CUTANEOUS | Status: AC
  Administered 2015-09-11: 4 via TOPICAL
  Filled 2015-09-10: qty 60

## 2015-09-10 MED ORDER — METHOCARBAMOL 1000 MG/10ML IJ SOLN
500.0000 mg | Freq: Four times a day (QID) | INTRAVENOUS | Status: DC | PRN
  Administered 2015-09-11: 500 mg via INTRAVENOUS
  Filled 2015-09-10 (×3): qty 5

## 2015-09-10 MED ORDER — HYDROCODONE-ACETAMINOPHEN 5-325 MG PO TABS: 1.0000 | ORAL_TABLET | Freq: Four times a day (QID) | ORAL | Status: DC | PRN

## 2015-09-10 MED ORDER — GABAPENTIN 400 MG PO CAPS
800.0000 mg | ORAL_CAPSULE | Freq: Three times a day (TID) | ORAL | Status: DC
  Administered 2015-09-10 – 2015-09-14 (×10): 800 mg via ORAL
  Filled 2015-09-10 (×10): qty 2

## 2015-09-10 MED ORDER — CEFAZOLIN SODIUM-DEXTROSE 2-3 GM-% IV SOLR
2.0000 g | INTRAVENOUS | Status: AC
  Administered 2015-09-11: 2 g via INTRAVENOUS
  Filled 2015-09-10 (×2): qty 50

## 2015-09-10 MED ORDER — LEVOTHYROXINE SODIUM 25 MCG PO TABS
25.0000 ug | ORAL_TABLET | Freq: Every day | ORAL | Status: DC
  Administered 2015-09-12 – 2015-09-14 (×3): 25 ug via ORAL
  Filled 2015-09-10 (×4): qty 1

## 2015-09-10 NOTE — Consult Note (Signed)
ORTHOPAEDIC CONSULTATION  REQUESTING PHYSICIAN: Barton Dubois, MD  Chief Complaint: R hip pain  HPI: Erin [name] is a 59 y.o. female who complains of R hip pain after a mechanical fall today.  The patient reports walking into a friends house where she got unbalanced by the friend's dogs.  She fell to the ground striking the right hip on the concrete.  She denies hitting her head or LOC.  She had immediate pain in the R hip and was unable to weight bear after the fall.   EMS transported the patient to [facility] where she was found to have a R hip fracture.  Patient denies hx of CAD, but she does have diet controlled DM.    Past Medical History  Diagnosis Date  . 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 Chronic daily headache 03/29/2013    takes bc powder  . IBS (irritable bowel syndrome) 03/29/2013  . Anxiety   . Depression   . HLD (hyperlipidemia)   . Allergy   . Blood transfusion without reported diagnosis     AS A CHILD  . Tubular adenoma of colon 09/08/2003  . Chronic back pain   . Hot flashes   . Alopecia   . Hx of radiation therapy 09/09/13-10/16/13    anal canal 60.4Gy/15fx  . Cancer ([place]) 08/14/13    invasive squamous cell ca   . DM (diabetes mellitus) ([place])     diet controlled  . History of chemotherapy     hx anal cancer  . GERD (gastroesophageal reflux disease)    Past Surgical History  Procedure Laterality Date  . Ectopic pregnancy surgery    . Pilonidal cyst excision    . Colonoscopy    . Flexible sigmoidoscopy N/A 08/14/2013    Procedure: FLEXIBLE SIGMOIDOSCOPY;  Surgeon: Ladene Artist, MD;  Location: [facility];  Service: Endoscopy;  Laterality: N/A;  . Polypectomy    . Tonsillectomy     Social History   Social History  . Marital Status: Married    Spouse Name: N/A  . Number of Children: 0  . Years of Education: N/A   Occupational History  . caregiver    Social History Main Topics  . Smoking status: Former Smoker -- 0.50 packs/day   Types: Cigarettes    Quit date: 10/07/2014  . Smokeless tobacco: Never Used  . Alcohol Use: No  . Drug Use: No  . Sexual Activity:    Partners: Male   Other Topics Concern  . None   Social History Narrative   Work or School: homemaker      Home Situation: lives with her husband,[name] takes care of her elderly parents       Spiritual Beliefs: Christian      Lifestyle: no regular exercise, poor diet               Family History  Problem Relation Age of Onset  . Arthritis Mother   . Hyperlipidemia Mother   . Heart disease Mother   . Hypertension Mother   . Stroke Mother 84  . Irritable bowel syndrome Mother   . Thyroid disease Mother   . Heart disease Father   . Hyperlipidemia Father   . Hypertension Father   . Stroke Father 67  . Thyroid disease Father   . Prostate cancer Father   . Lung cancer Brother   . Colon cancer Neg Hx   . Rectal cancer Neg Hx   . Stomach  cancer Neg Hx    Allergies  Allergen Reactions  . Dexamethasone Itching    Itching  . Atorvastatin Nausea And Vomiting  . Claritin [Loratadine]     Hot flashes  . Sulfa Antibiotics Other (See Comments)    GI distress-pain  . Prednisone Rash   Prior to Admission medications   Medication Sig Start Date End Date Taking? Authorizing Provider  Biotin 10 MG TABS Take 10 mg by mouth daily.   Yes Historical Provider, MD  bisacodyl (DULCOLAX) 5 MG EC tablet Take 5-10 mg by mouth daily.   Yes Historical Provider, MD  dicyclomine (BENTYL) 10 MG capsule Take 1 capsule (10 mg total) by mouth 3 (three) times daily before meals. 07/20/15  Yes Ladene Artist, MD  fluticasone (FLONASE) 50 MCG/ACT nasal spray Place 1 spray into both nostrils daily.   Yes Historical Provider, MD  gabapentin (NEURONTIN) 800 MG tablet Take 1 tablet (800 mg total) by mouth 3 (three) times daily. 06/19/15  Yes Lucretia Kern, DO  levothyroxine (SYNTHROID, LEVOTHROID) 25 MCG tablet Take 1 tablet (25 mcg total) by mouth daily before  breakfast. 08/06/15  Yes Lucretia Kern, DO  nortriptyline (PAMELOR) 25 MG capsule Takes 3 capsules daily 04/06/15  Yes Historical Provider, MD  omeprazole (PRILOSEC) 40 MG capsule Take 1 capsule (40 mg total) by mouth daily. 04/08/15  Yes Ladene Artist, MD  OVER THE COUNTER MEDICATION Take 1 capsule by mouth daily. Advance colon care II   Yes Historical Provider, MD  oxymetazoline (AFRIN) 0.05 % nasal spray Place 1 spray into both nostrils daily as needed for congestion.   Yes Historical Provider, MD  PARoxetine (PAXIL) 20 MG tablet Take 20 mg by mouth daily.   Yes Historical Provider, MD   Dg Chest 1 View  09/10/2015  CLINICAL DATA:  Pt fell this morning while helping a friend carry in groceries. Pt states friends dogs ran out and caused her to trip and fall. Pain to right hip. Pt unable to fully extend right leg. Right leg stuck in lateral position. Hx inva.*comment was truncated* Initial encounter. EXAM: CHEST 1 VIEW COMPARISON:  11/28/2013 CT FINDINGS: Midline trachea. Normal heart size and mediastinal contours. No pleural effusion or pneumothorax. Diffuse peribronchial thickening. Clear lungs. IMPRESSION: 1. No acute cardiopulmonary disease. 2. Mild peribronchial thickening which may relate to chronic bronchitis or smoking. 3. No posttraumatic deformity identified. Electronically Signed   By: Abigail Miyamoto M.D.   On: 09/10/2015 12:56   Dg Hip Unilat With Pelvis 2-3 Views Right  09/10/2015  CLINICAL DATA:  [place] this morning while helping a friend carry groceries, dog ran out and caused fall, RIGHT hip pain, unable to fully extend leg, stuck in lateral position EXAM: DG HIP (WITH OR WITHOUT PELVIS) 2-3V RIGHT COMPARISON:  None FINDINGS: Osseous demineralization. Symmetric hip and SI joints. Displaced subcapital fracture RIGHT femoral neck. No dislocation. Pelvis appears intact. IMPRESSION: Displaced subcapital fracture RIGHT femoral neck. Electronically Signed   By: Lavonia Dana M.D.   On: 09/10/2015  12:57    Positive ROS: All other systems have been reviewed and were otherwise negative with the exception of those mentioned in the HPI and as above.  Labs cbc  Recent Labs  09/10/15 1309  WBC 12.2*  HGB 11.4*  HCT 34.6*  PLT 349    Labs inflam No results for input(s): CRP in the last 72 hours.  Invalid input(s): ESR  Labs coag No results for input(s): INR, PTT in the last  72 hours.  Invalid input(s): PT   Recent Labs  09/10/15 1309  NA 138  K 3.4*  CL 103  CO2 26  GLUCOSE 125*  BUN 7  CREATININE 0.69  CALCIUM 8.8*    Physical Exam: Filed Vitals:   09/10/15 1402  BP: 108/75  Pulse: 84  Temp:   Resp: 15   General: Alert, no acute distress Cardiovascular: No pedal edema Respiratory: No cyanosis, no use of accessory musculature GI: No organomegaly, abdomen is soft and non-tender Skin: No lesions in the area of chief complaint other than those listed below in MSK exam.  Neurologic: Sensation intact distally Psychiatric: Patient is competent for consent with normal mood and affect Lymphatic: No axillary or cervical lymphadenopathy  MUSCULOSKELETAL:  Right leg has no obvious deformity.  No erythema or warmth of the skin.  Tender to palpation of the groin region.  Pain with log roll.  Sensation intact with 2+ distal pulses.  Other extremities are atraumatic with painless ROM and NVI.  Assessment: Displaced R subcapital femoral neck fracture  Plan: Recommending surgical intervention of the R hip fracture to improve pain and mobility.  Explained the risks and benefits of surgery to the patient.  She is understanding and wishes to proceed with surgery.  Patient will remain on bedrest till surgery.  She will be NPO after midnight.  Patient will be transported to [facility] for surgery tomorrow morning for R total hip arthroplasty.    [facility] [phone]   09/10/2015 4:35 PM

## 2015-09-10 NOTE — ED Notes (Signed)
Pt given swabs for dry mouth

## 2015-09-10 NOTE — ED Provider Notes (Signed)
CSN: [id number]     Arrival date & time 09/10/15  1149 History   First MD Initiated Contact with Patient 09/10/15 1201     Chief Complaint  Patient presents with  . Fall  . Hip Pain     (Consider location/radiation/quality/duration/timing/severity/associated sxs/prior Treatment) HPI   59 year old female with history of chronic headache, chronic back pain, diabetes, anxiety, depression brought here via EMS for evaluation of a recent fall. Patient report that 2 hrs ago she was at a friend's house, walking from the outside towards the house when she got tangled with the dogs and fell down striking her right hip against concrete ground. She denies head injury or loss of consciousness but report acute onset of sharp intense pain to the right hip which radiates to her right thigh, accompany with leg spasms. She did not want to get up from the ground fearing it may worsen her symptoms. EMS brought her here for further evaluation. At this time the pain is moderate in intensity with spasm to her right thigh. She denies having any significant headache, back pain, knee or ankle pain, numbness or weakness. No precipitating symptoms prior to the fall. No prior injury to her right hip. She is not on any blood thinning medication. No specific treatment tried.  Past Medical History  Diagnosis Date  . 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 Chronic daily headache 03/29/2013    takes bc powder  . IBS (irritable bowel syndrome) 03/29/2013  . Anxiety   . Depression   . HLD (hyperlipidemia)   . Allergy   . Blood transfusion without reported diagnosis     AS A CHILD  . Tubular adenoma of colon 09/08/2003  . Chronic back pain   . Hot flashes   . Alopecia   . Hx of radiation therapy 09/09/13-10/16/13    anal canal 60.4Gy/70fx  . Cancer ([place]) 08/14/13    invasive squamous cell ca   . DM (diabetes mellitus) ([place])     diet controlled  . History of chemotherapy     hx anal cancer  . GERD  (gastroesophageal reflux disease)    Past Surgical History  Procedure Laterality Date  . Ectopic pregnancy surgery    . Pilonidal cyst excision    . Colonoscopy    . Flexible sigmoidoscopy N/A 08/14/2013    Procedure: FLEXIBLE SIGMOIDOSCOPY;  Surgeon: Ladene Artist, MD;  Location: [facility];  Service: Endoscopy;  Laterality: N/A;  . Polypectomy    . Tonsillectomy     Family History  Problem Relation Age of Onset  . Arthritis Mother   . Hyperlipidemia Mother   . Heart disease Mother   . Hypertension Mother   . Stroke Mother 26  . Irritable bowel syndrome Mother   . Thyroid disease Mother   . Heart disease Father   . Hyperlipidemia Father   . Hypertension Father   . Stroke Father 108  . Thyroid disease Father   . Prostate cancer Father   . Lung cancer Brother   . Colon cancer Neg Hx   . Rectal cancer Neg Hx   . Stomach cancer Neg Hx    Social History  Substance Use Topics  . Smoking status: Former Smoker -- 0.50 packs/day    Types: Cigarettes    Quit date: 10/07/2014  . Smokeless tobacco: Never Used  . Alcohol Use: No   OB History    No data available     Review of Systems  Constitutional: Negative  for fever.  Musculoskeletal: Positive for arthralgias.  Skin: Negative for rash and wound.  Neurological: Negative for numbness.      Allergies  Dexamethasone; Atorvastatin; Claritin; Sulfa antibiotics; and Prednisone  Home Medications   Prior to Admission medications   Medication Sig Start Date End Date Taking? Authorizing Provider  Biotin 10 MG TABS Take 10 mg by mouth daily.    Historical Provider, MD  dicyclomine (BENTYL) 10 MG capsule Take 1 capsule (10 mg total) by mouth 3 (three) times daily before meals. 07/20/15   Ladene Artist, MD  gabapentin (NEURONTIN) 800 MG tablet Take 1 tablet (800 mg total) by mouth 3 (three) times daily. 06/19/15   Lucretia Kern, DO  levothyroxine (SYNTHROID, LEVOTHROID) 25 MCG tablet Take 1 tablet (25 mcg total) by mouth  daily before breakfast. 08/06/15   Lucretia Kern, DO  nortriptyline (PAMELOR) 25 MG capsule Takes 3 capsules daily 04/06/15   Historical Provider, MD  omeprazole (PRILOSEC) 40 MG capsule Take 1 capsule (40 mg total) by mouth daily. 04/08/15   Ladene Artist, MD  OVER THE COUNTER MEDICATION Take 1 capsule by mouth 2 (two) times daily before a meal. Advance colon care II    Historical Provider, MD  PARoxetine (PAXIL) 20 MG tablet Take 20 mg by mouth daily.    Historical Provider, MD  predniSONE (DELTASONE) 10 MG tablet 30mg  daily for 3 days, then 20mg  daily for 3 days, then 10mg  daily for 3 days. 08/14/15   Lucretia Kern, DO   BP 113/66 mmHg  Pulse 97  Temp(Src) 98.6 F (37 C) (Oral)  Resp 24  SpO2 99% Physical Exam  Constitutional: She appears well-developed and well-nourished. No distress.  HENT:  Head: Atraumatic.  Eyes: Conjunctivae are normal.  Neck: Neck supple.  Musculoskeletal: She exhibits tenderness (Right hip: Tenderness along the inguinal fold and at the greater trochanter on palpation with decreased range of motion secondary to pain. No gross deformity noted. Nontender along right femur on palpation. No overlying skin changes. No right knee or ankle).  Neurological: She is alert.  Skin: No rash noted.  Psychiatric: She has a normal mood and affect.  Nursing note and vitals reviewed.   ED Course  Procedures (including critical care time)  Right hip injury from a mechanical fall. Patient will benefit x-ray for further evaluation. Pain medication given.   2:35 PM X-ray of right hip demonstrate a closed displaced subcapital fractures of the right femoral neck. I discussed with on-call orthopedist, Dr. Percell Miller who request for patient to be transferred to [facility] for surgical intervention either tonight or early tomorrow morning. Patient to remain nothing by mouth. Her last meal was at 7 PM yesterday. Patient's made aware of plan and agrees with plan. Pain medication given. Patient  stabilized and is stable for transfer. I discussed with Dr. Dayna Barker.  3:21 PM Dr. Percell Miller called back and request for medicine to be consulted as pt is schedule to have surgery tomorrow morning.  Pt can eat up until midnight.  Appreciate consultation from [facility] Dr. Dyann Kief who will see pt in ER.  Pt will be transfer to [facility] under the care of Dr. Conley Canal, Team 6.   Labs Review Labs Reviewed  CBC - Abnormal; Notable for the following:    WBC 12.2 (*)    RBC 3.74 (*)    Hemoglobin 11.4 (*)    HCT 34.6 (*)    All other components within normal limits  COMPREHENSIVE METABOLIC  PANEL - Abnormal; Notable for the following:    Potassium 3.4 (*)    Glucose, Bld 125 (*)    Calcium 8.8 (*)    ALT 12 (*)    Alkaline Phosphatase 131 (*)    All other components within normal limits    Imaging Review Dg Chest 1 View  09/10/2015  CLINICAL DATA:  Pt fell this morning while helping a friend carry in groceries. Pt states friends dogs ran out and caused her to trip and fall. Pain to right hip. Pt unable to fully extend right leg. Right leg stuck in lateral position. Hx inva.*comment was truncated* Initial encounter. EXAM: CHEST 1 VIEW COMPARISON:  11/28/2013 CT FINDINGS: Midline trachea. Normal heart size and mediastinal contours. No pleural effusion or pneumothorax. Diffuse peribronchial thickening. Clear lungs. IMPRESSION: 1. No acute cardiopulmonary disease. 2. Mild peribronchial thickening which may relate to chronic bronchitis or smoking. 3. No posttraumatic deformity identified. Electronically Signed   By: Abigail Miyamoto M.D.   On: 09/10/2015 12:56   Dg Hip Unilat With Pelvis 2-3 Views Right  09/10/2015  CLINICAL DATA:  [place] this morning while helping a friend carry groceries, dog ran out and caused fall, RIGHT hip pain, unable to fully extend leg, stuck in lateral position EXAM: DG HIP (WITH OR WITHOUT PELVIS) 2-3V RIGHT COMPARISON:  None FINDINGS: Osseous demineralization. Symmetric hip  and SI joints. Displaced subcapital fracture RIGHT femoral neck. No dislocation. Pelvis appears intact. IMPRESSION: Displaced subcapital fracture RIGHT femoral neck. Electronically Signed   By: Lavonia Dana M.D.   On: 09/10/2015 12:57   I have personally reviewed and evaluated these images and lab results as part of my medical decision-making.   EKG Interpretation None      Date: 09/10/2015  Rate: 83  Rhythm: normal sinus rhythm  QRS Axis: normal  Intervals: normal  ST/T Wave abnormalities: normal  Conduction Disutrbances: none  Narrative Interpretation:   Old EKG Reviewed: No significant changes noted     MDM   Final diagnoses:  Closed right hip fracture, initial encounter (HCC)    BP 108/75 mmHg  Pulse 84  Temp(Src) 98.6 F (37 C) (Oral)  Resp 15  SpO2 100%     Domenic Moras, PA-C 09/10/15 [place], PA-C 09/10/15 [place], MD 09/11/15 [phone]

## 2015-09-10 NOTE — H&P (Signed)
[facility] History and Physical  Erin [name] HAL:[id number] DOB: Apr 07, 1956 DOA: 09/10/2015  Referring physician: Dr. Dolly Rias PCP: [name], DO   Chief Complaint: mechanical fall and right hip pain  HPI: Erin [name] is a 59 y.o. female with PMH significant for diabetes type 2 (diet controlled), anxiety/depression, hypothyroidism, neurodermatitis, GERD and HLD; who presented to ED after experiencing mechanical fall at home and developed excruciating right hip pain. Patient denies any prodromic symptoms prior to falling. After fall have trouble straightening her RLE and to much pain to even intend to bear weight.  She denies fever, CP, SOB, cough, chills, dysuria, abd pain, nausea, vomiting, melena, hematochezia or any other complaints.  In ED hip x-ray was positive for subcapital neck fracture; blood work up with mild hypokalemia and some leukocytosis. [facility] called to admit patient for further evaluation and treatment.    Review of Systems:  Negative except as mentioned on HPI.   Past Medical History  Diagnosis Date  . 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 Chronic daily headache 03/29/2013    takes bc powder  . IBS (irritable bowel syndrome) 03/29/2013  . Anxiety   . Depression   . HLD (hyperlipidemia)   . Allergy   . Blood transfusion without reported diagnosis     AS A CHILD  . Tubular adenoma of colon 09/08/2003  . Chronic back pain   . Hot flashes   . Alopecia   . Hx of radiation therapy 09/09/13-10/16/13    anal canal 60.4Gy/40fx  . Cancer ([place]) 08/14/13    invasive squamous cell ca   . DM (diabetes mellitus) ([place])     diet controlled  . History of chemotherapy     hx anal cancer  . GERD (gastroesophageal reflux disease)    Past Surgical History  Procedure Laterality Date  . Ectopic pregnancy surgery    . Pilonidal cyst excision    . Colonoscopy    . Flexible sigmoidoscopy N/A 08/14/2013    Procedure: FLEXIBLE SIGMOIDOSCOPY;  Surgeon:  Ladene Artist, MD;  Location: [facility];  Service: Endoscopy;  Laterality: N/A;  . Polypectomy    . Tonsillectomy     Social History:  reports that she quit smoking about 11 months ago. Her smoking use included Cigarettes. She smoked 0.50 packs per day. She has never used smokeless tobacco. She reports that she does not drink alcohol or use illicit drugs.  Allergies  Allergen Reactions  . Dexamethasone Itching    Itching  . Atorvastatin Nausea And Vomiting  . Claritin [Loratadine]     Hot flashes  . Sulfa Antibiotics Other (See Comments)    GI distress-pain  . Prednisone Rash    Family History  Problem Relation Age of Onset  . Arthritis Mother   . Hyperlipidemia Mother   . Heart disease Mother   . Hypertension Mother   . Stroke Mother 71  . Irritable bowel syndrome Mother   . Thyroid disease Mother   . Heart disease Father   . Hyperlipidemia Father   . Hypertension Father   . Stroke Father 65  . Thyroid disease Father   . Prostate cancer Father   . Lung cancer Brother   . Colon cancer Neg Hx   . Rectal cancer Neg Hx   . Stomach cancer Neg Hx     Prior to Admission medications   Medication Sig Start Date End Date Taking? Authorizing Provider  Biotin 10 MG TABS Take 10 mg by  mouth daily.   Yes Historical Provider, MD  bisacodyl (DULCOLAX) 5 MG EC tablet Take 5-10 mg by mouth daily.   Yes Historical Provider, MD  dicyclomine (BENTYL) 10 MG capsule Take 1 capsule (10 mg total) by mouth 3 (three) times daily before meals. 07/20/15  Yes Ladene Artist, MD  fluticasone (FLONASE) 50 MCG/ACT nasal spray Place 1 spray into both nostrils daily.   Yes Historical Provider, MD  gabapentin (NEURONTIN) 800 MG tablet Take 1 tablet (800 mg total) by mouth 3 (three) times daily. 06/19/15  Yes Lucretia Kern, DO  levothyroxine (SYNTHROID, LEVOTHROID) 25 MCG tablet Take 1 tablet (25 mcg total) by mouth daily before breakfast. 08/06/15  Yes Lucretia Kern, DO  nortriptyline (PAMELOR) 25 MG  capsule Takes 3 capsules daily 04/06/15  Yes Historical Provider, MD  omeprazole (PRILOSEC) 40 MG capsule Take 1 capsule (40 mg total) by mouth daily. 04/08/15  Yes Ladene Artist, MD  OVER THE COUNTER MEDICATION Take 1 capsule by mouth daily. Advance colon care II   Yes Historical Provider, MD  oxymetazoline (AFRIN) 0.05 % nasal spray Place 1 spray into both nostrils daily as needed for congestion.   Yes Historical Provider, MD  PARoxetine (PAXIL) 20 MG tablet Take 20 mg by mouth daily.   Yes Historical Provider, MD   Physical Exam: Filed Vitals:   09/10/15 1156 09/10/15 1304 09/10/15 1402  BP: 113/66 132/64 108/75  Pulse: 97 88 84  Temp: 98.6 F (37 C)    TempSrc: Oral    Resp: 24 21 15   SpO2: 99% 100% 100%    Wt Readings from Last 3 Encounters:  08/03/15 53.071 kg (117 lb)  07/20/15 53.071 kg (117 lb)  07/07/15 52.164 kg (115 lb)    General:  Appears in no distress, no CP, no SOB. Patient with decrease range of motion, positive lateral rotation and severe pain on her right hip; afebrile. Eyes: PERRL, normal lids, irises & conjunctiva, no icterus, no nystagmus  ENT: grossly normal hearing, lips & tongue, no erythema, no exudates, no thrush; no drainage out of ears or nostrils  Neck: no LAD, masses or thyromegaly, no JVD Cardiovascular: RRR, no gallops or rubs; positive soft murmur appreciated on exam. No LE edema. Respiratory: CTA bilaterally, no w/r/r. Normal respiratory effort. Abdomen: soft, nt, nd, positive BS Skin: no rash or induration seen on limited exam Musculoskeletal: right LE with lateral rotation, decrease range of motion due to pain, no joint swelling  Psychiatric: grossly normal mood and affect, speech fluent and appropriate Neurologic: grossly non-focal.          Labs on Admission:  Basic Metabolic Panel:  Recent Labs Lab 09/10/15 1309  NA 138  K 3.4*  CL 103  CO2 26  GLUCOSE 125*  BUN 7  CREATININE 0.69  CALCIUM 8.8*   Liver Function  Tests:  Recent Labs Lab 09/10/15 1309  AST 17  ALT 12*  ALKPHOS 131*  BILITOT 0.4  PROT 7.2  ALBUMIN 4.0   CBC:  Recent Labs Lab 09/10/15 1309  WBC 12.2*  HGB 11.4*  HCT 34.6*  MCV 92.5  PLT 349   Radiological Exams on Admission: Dg Chest 1 View  09/10/2015  CLINICAL DATA:  Pt fell this morning while helping a friend carry in groceries. Pt states friends dogs ran out and caused her to trip and fall. Pain to right hip. Pt unable to fully extend right leg. Right leg stuck in lateral position. Hx inva.*comment was truncated*  Initial encounter. EXAM: CHEST 1 VIEW COMPARISON:  11/28/2013 CT FINDINGS: Midline trachea. Normal heart size and mediastinal contours. No pleural effusion or pneumothorax. Diffuse peribronchial thickening. Clear lungs. IMPRESSION: 1. No acute cardiopulmonary disease. 2. Mild peribronchial thickening which may relate to chronic bronchitis or smoking. 3. No posttraumatic deformity identified. Electronically Signed   By: Abigail Miyamoto M.D.   On: 09/10/2015 12:56   Dg Hip Unilat With Pelvis 2-3 Views Right  09/10/2015  CLINICAL DATA:  [place] this morning while helping a friend carry groceries, dog ran out and caused fall, RIGHT hip pain, unable to fully extend leg, stuck in lateral position EXAM: DG HIP (WITH OR WITHOUT PELVIS) 2-3V RIGHT COMPARISON:  None FINDINGS: Osseous demineralization. Symmetric hip and SI joints. Displaced subcapital fracture RIGHT femoral neck. No dislocation. Pelvis appears intact. IMPRESSION: Displaced subcapital fracture RIGHT femoral neck. Electronically Signed   By: Lavonia Dana M.D.   On: 09/10/2015 12:57    EKG: No acute ischemic changes, regular rate and rhythm   Assessment/Plan 1-Closed right hip fracture [facility]): after mechanical fall, no LOC, palpitations or any other complaints. Patient tangled with dogs and fell -x-ray with right hip fracture -will need surgical repair and orthopedic service consulted -will use PRN analgesics   -after surgery will have PT/OT evaluation -no weight bearing for now -will check vit D level  2-Neurodermatitis: -will continue neurontin -will check B12  3-Hyperlipemia: will check lipid panel -has statins intolerance  4-Depression and Anxiety: stable -no hallucinations  -will continue home psych regimen (paxil and pamelor)  5-Type 2 diabetes mellitus without complication ([place]): controlled with diet prior to admission -will check A1C  -CBG's in the 125 range on admission -SSI ordered   6-Hypokalemia: will replete and check magnesium level   7-Hypothyroidism: will check TSH and continue synthroid   8-GERD without esophagitis: will continue PPI   9-Leukocytosis: due to demargination most likely -no signs of infection appreciated -will follow WBC's trend -will give IVF's   Orthopedic surgery (Dr. Percell Miller)  Code Status: Full DVT Prophylaxis:heparin Family Communication: husband at bedside Disposition Plan: will admit to medsurg, inpatient, LOS > 2 midnights   Time spent: 70 minutes  [place], [facility] Pager [phone]

## 2015-09-10 NOTE — ED Notes (Signed)
Per EMS-pt took mechanical fall this morning. Fell on right side. No LOC-denies hitting head. Denies neck or back pain. Having right hip pain. Unable to straighten leg or move right hip. Cleared SCCA. VS: 102/70 HR 84 RR 18 SpO2 98% on RA.

## 2015-09-11 ENCOUNTER — Inpatient Hospital Stay (HOSPITAL_COMMUNITY): Payer: No Typology Code available for payment source | Admitting: Anesthesiology

## 2015-09-11 ENCOUNTER — Inpatient Hospital Stay (HOSPITAL_COMMUNITY): Payer: No Typology Code available for payment source

## 2015-09-11 ENCOUNTER — Encounter (HOSPITAL_COMMUNITY): Payer: Self-pay | Admitting: Anesthesiology

## 2015-09-11 ENCOUNTER — Encounter (HOSPITAL_COMMUNITY)
Admission: EM | Disposition: A | Discharge status: Home Health | Payer: Self-pay | Source: Home / Self Care | Attending: Internal Medicine

## 2015-09-11 DIAGNOSIS — S72001D Fracture of unspecified part of neck of right femur, subsequent encounter for closed fracture with routine healing: Secondary | ICD-10-CM

## 2015-09-11 DIAGNOSIS — D519 Vitamin B12 deficiency anemia, unspecified: Secondary | ICD-10-CM

## 2015-09-11 DIAGNOSIS — E039 Hypothyroidism, unspecified: Secondary | ICD-10-CM

## 2015-09-11 HISTORY — PX: TOTAL HIP ARTHROPLASTY: SHX124

## 2015-09-11 LAB — BASIC METABOLIC PANEL
ANION GAP: 8 (ref 5–15)
BUN: <5 mg/dL — ABNORMAL LOW (ref 6–20)
CALCIUM: 8.7 mg/dL — AB (ref 8.9–10.3)
CO2: 28 mmol/L (ref 22–32)
Chloride: 105 mmol/L (ref 101–111)
Creatinine, Ser: 0.6 mg/dL (ref 0.44–1.00)
GFR calc Af Amer: >60 mL/min (ref >60)
GLUCOSE: 93 mg/dL (ref 65–99)
Potassium: 3.6 mmol/L (ref 3.5–5.1)
Sodium: 141 mmol/L (ref 135–145)

## 2015-09-11 LAB — CBC
HCT: 33.5 % — ABNORMAL LOW (ref 36.0–46.0)
Hemoglobin: 10.8 g/dL — ABNORMAL LOW (ref 12.0–15.0)
MCH: 30.1 pg (ref 26.0–34.0)
MCHC: 32.2 g/dL (ref 30.0–36.0)
MCV: 93.3 fL (ref 78.0–100.0)
PLATELETS: 286 10*3/uL (ref 150–400)
RBC: 3.59 MIL/uL — ABNORMAL LOW (ref 3.87–5.11)
RDW: 14.3 % (ref 11.5–15.5)
WBC: 5.1 10*3/uL (ref 4.0–10.5)

## 2015-09-11 LAB — HEMOGLOBIN A1C
Hgb A1c MFr Bld: 5.6 % (ref 4.8–5.6)
Mean Plasma Glucose: 114 mg/dL

## 2015-09-11 LAB — GLUCOSE, CAPILLARY
GLUCOSE-CAPILLARY: 94 mg/dL (ref 65–99)
GLUCOSE-CAPILLARY: 112 mg/dL — AB (ref 65–99)
GLUCOSE-CAPILLARY: 95 mg/dL (ref 65–99)
Glucose-Capillary: 97 mg/dL (ref 65–99)
Glucose-Capillary: 113 mg/dL — ABNORMAL HIGH (ref 65–99)
Glucose-Capillary: 85 mg/dL (ref 65–99)

## 2015-09-11 SURGERY — ARTHROPLASTY, HIP, TOTAL,POSTERIOR APPROACH
Anesthesia: General

## 2015-09-11 MED ORDER — ACETAMINOPHEN 160 MG/5ML PO SOLN
325.0000 mg | ORAL | Status: DC | PRN
  Filled 2015-09-11: qty 20.3

## 2015-09-11 MED ORDER — OXYCODONE HCL 5 MG PO TABS
5.0000 mg | ORAL_TABLET | Freq: Once | ORAL | Status: AC | PRN
  Administered 2015-09-11: 5 mg via ORAL

## 2015-09-11 MED ORDER — PROPOFOL 10 MG/ML IV BOLUS
INTRAVENOUS | Status: DC | PRN
  Administered 2015-09-11: 90 mg via INTRAVENOUS

## 2015-09-11 MED ORDER — ONDANSETRON HCL 4 MG/2ML IJ SOLN
4.0000 mg | Freq: Four times a day (QID) | INTRAMUSCULAR | Status: DC | PRN
  Administered 2015-09-11 – 2015-09-13 (×2): 4 mg via INTRAVENOUS
  Filled 2015-09-11: qty 2

## 2015-09-11 MED ORDER — ACETAMINOPHEN 325 MG PO TABS: 650.0000 mg | ORAL_TABLET | Freq: Four times a day (QID) | ORAL | Status: DC | PRN

## 2015-09-11 MED ORDER — HYDROCODONE-ACETAMINOPHEN 5-325 MG PO TABS: 1.0000 | ORAL_TABLET | Freq: Four times a day (QID) | ORAL | Status: DC | PRN

## 2015-09-11 MED ORDER — PHENYLEPHRINE HCL 10 MG/ML IJ SOLN
10.0000 mg | INTRAVENOUS | Status: DC | PRN
  Administered 2015-09-11: 15 ug/min via INTRAVENOUS

## 2015-09-11 MED ORDER — HYDROMORPHONE HCL 1 MG/ML IJ SOLN
0.2500 mg | INTRAMUSCULAR | Status: DC | PRN
Start: 2015-09-11 — End: 2015-09-13

## 2015-09-11 MED ORDER — HYDROMORPHONE HCL 1 MG/ML IJ SOLN
0.2500 mg | INTRAMUSCULAR | Status: DC | PRN
  Administered 2015-09-11: 0.5 mg via INTRAVENOUS

## 2015-09-11 MED ORDER — TRANEXAMIC ACID 1000 MG/10ML IV SOLN
1000.0000 mg | INTRAVENOUS | Status: AC
  Administered 2015-09-11 (×2): 1000 mg via INTRAVENOUS
  Filled 2015-09-11: qty 10

## 2015-09-11 MED ORDER — SODIUM CHLORIDE 0.9 % IV SOLN
1000.0000 mg | INTRAVENOUS | Status: DC
  Filled 2015-09-11: qty 10

## 2015-09-11 MED ORDER — ROCURONIUM BROMIDE 100 MG/10ML IV SOLN
INTRAVENOUS | Status: DC | PRN
  Administered 2015-09-11: 10 mg via INTRAVENOUS
  Administered 2015-09-11: 35 mg via INTRAVENOUS

## 2015-09-11 MED ORDER — PROPOFOL 10 MG/ML IV BOLUS
INTRAVENOUS | Status: AC
  Filled 2015-09-11: qty 20

## 2015-09-11 MED ORDER — SUGAMMADEX SODIUM 500 MG/5ML IV SOLN
INTRAVENOUS | Status: AC
  Filled 2015-09-11: qty 5

## 2015-09-11 MED ORDER — METOCLOPRAMIDE HCL 5 MG/ML IJ SOLN
5.0000 mg | Freq: Three times a day (TID) | INTRAMUSCULAR | Status: DC | PRN
  Administered 2015-09-11: 10 mg via INTRAVENOUS

## 2015-09-11 MED ORDER — ONDANSETRON HCL 4 MG/2ML IJ SOLN
INTRAMUSCULAR | Status: AC
  Filled 2015-09-11: qty 2

## 2015-09-11 MED ORDER — ROCURONIUM BROMIDE 50 MG/5ML IV SOLN
INTRAVENOUS | Status: AC
  Filled 2015-09-11: qty 1

## 2015-09-11 MED ORDER — FENTANYL CITRATE (PF) 100 MCG/2ML IJ SOLN
INTRAMUSCULAR | Status: DC | PRN
  Administered 2015-09-11: 100 ug via INTRAVENOUS

## 2015-09-11 MED ORDER — ACETAMINOPHEN 650 MG RE SUPP: 650.0000 mg | Freq: Four times a day (QID) | RECTAL | Status: DC | PRN

## 2015-09-11 MED ORDER — PROPOFOL 10 MG/ML IV BOLUS
INTRAVENOUS | Status: AC
Start: 2015-09-11 — End: 2015-09-11
  Filled 2015-09-11: qty 20

## 2015-09-11 MED ORDER — LIDOCAINE HCL (CARDIAC) 20 MG/ML IV SOLN
INTRAVENOUS | Status: DC | PRN
  Administered 2015-09-11: 60 mg via INTRAVENOUS

## 2015-09-11 MED ORDER — EPHEDRINE SULFATE 50 MG/ML IJ SOLN
INTRAMUSCULAR | Status: AC
  Filled 2015-09-11: qty 1

## 2015-09-11 MED ORDER — MIDAZOLAM HCL 5 MG/5ML IJ SOLN
INTRAMUSCULAR | Status: DC | PRN
  Administered 2015-09-11: 2 mg via INTRAVENOUS

## 2015-09-11 MED ORDER — MIDAZOLAM HCL 2 MG/2ML IJ SOLN
INTRAMUSCULAR | Status: AC
  Filled 2015-09-11: qty 4

## 2015-09-11 MED ORDER — OXYCODONE HCL 5 MG/5ML PO SOLN: 5.0000 mg | Freq: Once | ORAL | Status: AC | PRN

## 2015-09-11 MED ORDER — LORAZEPAM 2 MG/ML IJ SOLN: 0.5000 mg | INTRAMUSCULAR | Status: DC | PRN

## 2015-09-11 MED ORDER — SUGAMMADEX SODIUM 500 MG/5ML IV SOLN: INTRAVENOUS | Status: DC | PRN

## 2015-09-11 MED ORDER — FENTANYL CITRATE (PF) 250 MCG/5ML IJ SOLN
INTRAMUSCULAR | Status: AC
  Filled 2015-09-11: qty 5

## 2015-09-11 MED ORDER — SODIUM CHLORIDE 0.9 % IJ SOLN
INTRAMUSCULAR | Status: AC
  Filled 2015-09-11: qty 10

## 2015-09-11 MED ORDER — ONDANSETRON HCL 4 MG PO TABS
4.0000 mg | ORAL_TABLET | Freq: Four times a day (QID) | ORAL | Status: DC | PRN
  Administered 2015-09-12 – 2015-09-13 (×2): 4 mg via ORAL
  Filled 2015-09-11 (×2): qty 1

## 2015-09-11 MED ORDER — METOCLOPRAMIDE HCL 5 MG PO TABS: 5.0000 mg | ORAL_TABLET | Freq: Three times a day (TID) | ORAL | Status: DC | PRN

## 2015-09-11 MED ORDER — LIDOCAINE HCL (CARDIAC) 20 MG/ML IV SOLN
INTRAVENOUS | Status: AC
  Filled 2015-09-11: qty 5

## 2015-09-11 MED ORDER — HYDROMORPHONE HCL 1 MG/ML IJ SOLN
INTRAMUSCULAR | Status: AC
  Filled 2015-09-11: qty 1

## 2015-09-11 MED ORDER — PHENYLEPHRINE 40 MCG/ML (10ML) SYRINGE FOR IV PUSH (FOR BLOOD PRESSURE SUPPORT)
PREFILLED_SYRINGE | INTRAVENOUS | Status: AC
  Filled 2015-09-11: qty 10

## 2015-09-11 MED ORDER — ACETAMINOPHEN 325 MG PO TABS: 325.0000 mg | ORAL_TABLET | ORAL | Status: DC | PRN

## 2015-09-11 MED ORDER — VITAMIN B-12 100 MCG PO TABS
100.0000 ug | ORAL_TABLET | Freq: Every day | ORAL | Status: DC
  Administered 2015-09-12: 100 ug via ORAL
  Filled 2015-09-11 (×3): qty 1

## 2015-09-11 MED ORDER — HYDROMORPHONE HCL 1 MG/ML IJ SOLN
1.0000 mg | INTRAMUSCULAR | Status: DC | PRN
  Administered 2015-09-11 – 2015-09-12 (×3): 1 mg via INTRAVENOUS
  Filled 2015-09-11 (×3): qty 1

## 2015-09-11 MED ORDER — PHENOL 1.4 % MT LIQD: 1.0000 | OROMUCOSAL | Status: DC | PRN

## 2015-09-11 MED ORDER — CEFAZOLIN SODIUM-DEXTROSE 2-3 GM-% IV SOLR
2.0000 g | Freq: Four times a day (QID) | INTRAVENOUS | Status: AC
  Administered 2015-09-11 (×2): 2 g via INTRAVENOUS
  Filled 2015-09-11 (×3): qty 50

## 2015-09-11 MED ORDER — FENTANYL CITRATE (PF) 250 MCG/5ML IJ SOLN
INTRAMUSCULAR | Status: AC
Start: 2015-09-11 — End: 2015-09-11
  Filled 2015-09-11: qty 5

## 2015-09-11 MED ORDER — SUCCINYLCHOLINE CHLORIDE 20 MG/ML IJ SOLN
INTRAMUSCULAR | Status: AC
  Filled 2015-09-11: qty 1

## 2015-09-11 MED ORDER — ASPIRIN 325 MG PO TABS: 325.0000 mg | ORAL_TABLET | Freq: Every day | ORAL | Status: DC

## 2015-09-11 MED ORDER — MENTHOL 3 MG MT LOZG
1.0000 | LOZENGE | OROMUCOSAL | Status: DC | PRN
Start: 2015-09-11 — End: 2015-09-14

## 2015-09-11 MED ORDER — 0.9 % SODIUM CHLORIDE (POUR BTL) OPTIME
TOPICAL | Status: DC | PRN
  Administered 2015-09-11: 1000 mL

## 2015-09-11 MED ORDER — METOCLOPRAMIDE HCL 5 MG/ML IJ SOLN
INTRAMUSCULAR | Status: AC
  Filled 2015-09-11: qty 2

## 2015-09-11 MED ORDER — METHOCARBAMOL 500 MG PO TABS: 500.0000 mg | ORAL_TABLET | Freq: Four times a day (QID) | ORAL | Status: DC

## 2015-09-11 MED ORDER — ASPIRIN EC 325 MG PO TBEC
325.0000 mg | DELAYED_RELEASE_TABLET | Freq: Every day | ORAL | Status: DC
  Administered 2015-09-12 – 2015-09-14 (×3): 325 mg via ORAL
  Filled 2015-09-11 (×3): qty 1

## 2015-09-11 MED ORDER — PROMETHAZINE HCL 25 MG/ML IJ SOLN
6.2500 mg | INTRAMUSCULAR | Status: DC | PRN
  Filled 2015-09-11 (×2): qty 1

## 2015-09-11 MED ORDER — SUGAMMADEX SODIUM 500 MG/5ML IV SOLN
INTRAVENOUS | Status: DC | PRN
Start: 2015-09-11 — End: 2015-09-11
  Administered 2015-09-11: 850 mg via INTRAVENOUS

## 2015-09-11 MED ORDER — ONDANSETRON HCL 4 MG PO TABS: 4.0000 mg | ORAL_TABLET | Freq: Three times a day (TID) | ORAL | Status: DC | PRN

## 2015-09-11 MED ORDER — LACTATED RINGERS IV SOLN
INTRAVENOUS | Status: DC
  Administered 2015-09-11: 08:00:00 via INTRAVENOUS

## 2015-09-11 MED ORDER — ONDANSETRON HCL 4 MG/2ML IJ SOLN
INTRAMUSCULAR | Status: DC | PRN
  Administered 2015-09-11: 4 mg via INTRAVENOUS

## 2015-09-11 MED ORDER — ENOXAPARIN SODIUM 30 MG/0.3ML ~~LOC~~ SOLN
30.0000 mg | SUBCUTANEOUS | Status: DC
  Administered 2015-09-11 – 2015-09-13 (×3): 30 mg via SUBCUTANEOUS
  Filled 2015-09-11 (×3): qty 0.3

## 2015-09-11 MED ORDER — OXYCODONE HCL 5 MG PO TABS
ORAL_TABLET | ORAL | Status: AC
  Filled 2015-09-11: qty 1

## 2015-09-11 MED ORDER — HYDROCODONE-ACETAMINOPHEN 5-325 MG PO TABS
1.0000 | ORAL_TABLET | ORAL | Status: DC | PRN
  Administered 2015-09-11 – 2015-09-14 (×11): 2 via ORAL
  Filled 2015-09-11 (×12): qty 2

## 2015-09-11 SURGICAL SUPPLY — 49 items
BIT DRILL 7/64X5 DISP (BIT) ×3 IMPLANT
BLADE SAGITTAL 25.0X1.27X90 (BLADE) ×3 IMPLANT
CAPT HIP TOTAL 2 ×2 IMPLANT
CLSR STERI-STRIP ANTIMIC 1/2X4 (GAUZE/BANDAGES/DRESSINGS) ×4 IMPLANT
CONT SPEC 4OZ CLIKSEAL STRL BL (MISCELLANEOUS) ×2 IMPLANT
COVER SURGICAL LIGHT HANDLE (MISCELLANEOUS) ×3 IMPLANT
DRAPE IMP U-DRAPE 54X76 (DRAPES) ×3 IMPLANT
DRAPE ORTHO SPLIT 77X108 STRL (DRAPES) ×6
DRAPE SURG ORHT 6 SPLT 77X108 (DRAPES) ×4 IMPLANT
DRAPE U-SHAPE 47X51 STRL (DRAPES) ×3 IMPLANT
DRSG MEPILEX BORDER 4X8 (GAUZE/BANDAGES/DRESSINGS) ×3 IMPLANT
DURAPREP 26ML APPLICATOR (WOUND CARE) ×3 IMPLANT
ELECT BLADE 4.0 EZ CLEAN MEGAD (MISCELLANEOUS) ×3
ELECT CAUTERY BLADE 6.4 (BLADE) ×3 IMPLANT
ELECT REM PT RETURN 9FT ADLT (ELECTROSURGICAL) ×3
ELECTRODE BLDE 4.0 EZ CLN MEGD (MISCELLANEOUS) ×2 IMPLANT
ELECTRODE REM PT RTRN 9FT ADLT (ELECTROSURGICAL) ×2 IMPLANT
FACESHIELD WRAPAROUND (MASK) ×3 IMPLANT
FACESHIELD WRAPAROUND OR TEAM (MASK) ×1 IMPLANT
GLOVE BIO SURGEON STRL SZ7 (GLOVE) ×3 IMPLANT
GLOVE BIO SURGEON STRL SZ7.5 (GLOVE) IMPLANT
GLOVE BIOGEL PI IND STRL 7.0 (GLOVE) ×2 IMPLANT
GLOVE BIOGEL PI IND STRL 8 (GLOVE) ×2 IMPLANT
GLOVE BIOGEL PI INDICATOR 7.0 (GLOVE) ×1
GLOVE BIOGEL PI INDICATOR 8 (GLOVE) ×1
GOWN STRL REUS W/ TWL LRG LVL3 (GOWN DISPOSABLE) ×2 IMPLANT
GOWN STRL REUS W/ TWL XL LVL3 (GOWN DISPOSABLE) ×4 IMPLANT
GOWN STRL REUS W/TWL LRG LVL3 (GOWN DISPOSABLE) ×3
GOWN STRL REUS W/TWL XL LVL3 (GOWN DISPOSABLE) ×6
KIT BASIN OR (CUSTOM PROCEDURE TRAY) ×3 IMPLANT
KIT ROOM TURNOVER OR (KITS) ×3 IMPLANT
MANIFOLD NEPTUNE II (INSTRUMENTS) ×3 IMPLANT
NS IRRIG 1000ML POUR BTL (IV SOLUTION) ×3 IMPLANT
PACK TOTAL JOINT (CUSTOM PROCEDURE TRAY) ×3 IMPLANT
PACK UNIVERSAL I (CUSTOM PROCEDURE TRAY) ×3 IMPLANT
PAD ARMBOARD 7.5X6 YLW CONV (MISCELLANEOUS) ×6 IMPLANT
PILLOW ABDUCTION HIP (SOFTGOODS) ×3 IMPLANT
RETRIEVER SUT HEWSON (MISCELLANEOUS) ×3 IMPLANT
SUT FIBERWIRE #2 38 REV NDL BL (SUTURE) ×6
SUT MNCRL AB 4-0 PS2 18 (SUTURE) ×3 IMPLANT
SUT MON AB 2-0 CT1 36 (SUTURE) ×3 IMPLANT
SUT VIC AB 1 CT1 27 (SUTURE) ×3
SUT VIC AB 1 CT1 27XBRD ANBCTR (SUTURE) ×2 IMPLANT
SUT VIC AB 3-0 SH 8-18 (SUTURE) ×2 IMPLANT
SUTURE FIBERWR#2 38 REV NDL BL (SUTURE) ×4 IMPLANT
TOWEL OR 17X24 6PK STRL BLUE (TOWEL DISPOSABLE) ×3 IMPLANT
TOWEL OR 17X26 10 PK STRL BLUE (TOWEL DISPOSABLE) ×3 IMPLANT
TRAY FOLEY CATH 14FR (SET/KITS/TRAYS/PACK) IMPLANT
WATER STERILE IRR 1000ML POUR (IV SOLUTION) ×6 IMPLANT

## 2015-09-11 NOTE — Anesthesia Preprocedure Evaluation (Addendum)
Anesthesia Evaluation  Patient identified by MRN, date of birth, ID band Patient awake    Reviewed: Allergy & Precautions, H&P , NPO status , Patient's Chart, lab work & pertinent test results  History of Anesthesia Complications Negative for: history of anesthetic complications  Airway Mallampati: II  TM Distance: >3 FB Neck ROM: full    Dental no notable dental hx. (+) Teeth Intact   Pulmonary neg pulmonary ROS, former smoker,    Pulmonary exam normal breath sounds clear to auscultation       Cardiovascular negative cardio ROS Normal cardiovascular exam Rhythm:regular Rate:Normal     Neuro/Psych  Headaches, PSYCHIATRIC DISORDERS Anxiety Depression    GI/Hepatic Neg liver ROS, GERD  Medicated and Controlled,  Endo/Other  diabetes, Well ControlledHypothyroidism   Renal/GU negative Renal ROS     Musculoskeletal   Abdominal   Peds  Hematology negative hematology ROS (+)   Anesthesia Other Findings neurodermatitis  Reproductive/Obstetrics negative OB ROS                         Anesthesia Physical Anesthesia Plan  ASA: III  Anesthesia Plan: General   Post-op Pain Management:    Induction: Intravenous  Airway Management Planned: Oral ETT  Additional Equipment: None  Intra-op Plan:   Post-operative Plan: Extubation in OR  Informed Consent: I have reviewed the patients History and Physical, chart, labs and discussed the procedure including the risks, benefits and alternatives for the proposed anesthesia with the patient or authorized representative who has indicated his/her understanding and acceptance.   Dental Advisory Given  Plan Discussed with: Anesthesiologist, CRNA and Surgeon  Anesthesia Plan Comments: (Will offer spinal if no contraindications Avoid decadron given allergy, consider propofol background infusion as substitute for PONV prophylaxis)      Anesthesia Quick  Evaluation

## 2015-09-11 NOTE — Progress Notes (Signed)
[facility] PROGRESS NOTE  Erin [name] XFG:[id number] DOB: 11/27/1955 DOA: 09/10/2015 PCP: [name], DO  59 y.o. female with PMH significant for diabetes type 2 (diet controlled), anxiety/depression, hypothyroidism, neurodermatitis, GERD and HLD; who presented to ED after experiencing mechanical fall at home and sustained a subcapital fracture of rt femoral neck. Seen by ortho and transferred to [facility] for surgery. Underwent total hip arthroplasty on 10/21.  Assessment/Plan: Closed right hip fracture ([place]):  secondary to mechanical fall S/p total hip arthroplasty today. tolerated well. Ordered PT. Pain control with prn oxycodone and robaxin. Added bowel regimen. lovenox for DVT prophylaxis.  PT eval in am. Pt does not wish to go to SNF.  Neurodermatitis: continue neurontin  B12 Deficiency  As noted outpt. Per PCP note from 08/03/15 plan on starting b12 but donot see in her medication list. b12 checked on 10/20 low nomral ( 385) will add supplements.  Hypothyroidism Recently started on synthroid as outpt.  Depression and Anxiety stable  continue home  regimen (paxil and pamelor)  Type 2 DM, diet controlled -A1C of 5.6 -monitor on SSI 6-Hypokalemia: will replete and check magnesium level   7-Hypothyroidism: will check TSH and continue synthroid   8-GERD without esophagitis: will continue PPI  DVT prophylaxis: sq lovenox  diet: regular  Code Status: full  Family Communication: husband at bedside Disposition Plan: PT eval in am. Pt wants to go home   Consultants:  Dr Percell Miller  Procedures:  ORIF rt hip  Antibiotics:  perioperative  HPI/Subjective: Seen after returning from OR. Denies any pain  Objective: Filed Vitals:   09/11/15 1300  BP: 97/67  Pulse: 86  Temp: 98.1 F (36.7 C)  Resp: 16    Intake/Output Summary (Last 24 hours) at 09/11/15 1512 Last data filed at 09/11/15 0947  Gross per 24 hour  Intake    700 ml  Output    250  ml  Net    450 ml   Filed Weights   09/10/15 1742  Weight: 53.479 kg (117 lb 14.4 oz)    Exam:   General:  NAD  HEENT: no pallor, moist mucosa   chest: clear b/l   CVS: NS1&S2  GI: soft, NT, ND  Musculoskeletal: dressing over rt hip. No edema  Data Reviewed: Basic Metabolic Panel:  Recent Labs Lab 09/10/15 1309 09/10/15 1917 09/11/15 0527  NA 138  --  141  K 3.4*  --  3.6  CL 103  --  105  CO2 26  --  28  GLUCOSE 125*  --  93  BUN 7  --  <5*  CREATININE 0.69 0.74 0.60  CALCIUM 8.8*  --  8.7*   Liver Function Tests:  Recent Labs Lab 09/10/15 1309  AST 17  ALT 12*  ALKPHOS 131*  BILITOT 0.4  PROT 7.2  ALBUMIN 4.0   No results for input(s): LIPASE, AMYLASE in the last 168 hours. No results for input(s): AMMONIA in the last 168 hours. CBC:  Recent Labs Lab 09/10/15 1309 09/10/15 1917 09/11/15 0527  WBC 12.2* 9.9 5.1  HGB 11.4* 11.4* 10.8*  HCT 34.6* 34.4* 33.5*  MCV 92.5 93.5 93.3  PLT 349 290 286   Cardiac Enzymes: No results for input(s): CKTOTAL, CKMB, CKMBINDEX, TROPONINI in the last 168 hours. BNP (last 3 results) No results for input(s): BNP in the last 8760 hours.  ProBNP (last 3 results) No results for input(s): PROBNP in the last 8760 hours.  CBG:  Recent Labs Lab  09/10/15 2128 09/11/15 0544 09/11/15 0744 09/11/15 1005  GLUCAP 120* 94 97 112*    Recent Results (from the past 240 hour(s))  Surgical pcr screen     Status: None   Collection Time: 09/10/15  6:53 PM  Result Value Ref Range Status   MRSA, PCR NEGATIVE NEGATIVE Final   Staphylococcus aureus NEGATIVE NEGATIVE Final    Comment:        The Xpert SA Assay (FDA approved for NASAL specimens in patients over 71 years of age), is one component of a comprehensive surveillance program.  Test performance has been validated by [facility] for patients greater than or equal to 2 year old. It is not intended to diagnose infection nor to guide or monitor  treatment.      Studies: Dg Chest 1 View  09/10/2015  CLINICAL DATA:  Pt fell this morning while helping a friend carry in groceries. Pt states friends dogs ran out and caused her to trip and fall. Pain to right hip. Pt unable to fully extend right leg. Right leg stuck in lateral position. Hx inva.*comment was truncated* Initial encounter. EXAM: CHEST 1 VIEW COMPARISON:  11/28/2013 CT FINDINGS: Midline trachea. Normal heart size and mediastinal contours. No pleural effusion or pneumothorax. Diffuse peribronchial thickening. Clear lungs. IMPRESSION: 1. No acute cardiopulmonary disease. 2. Mild peribronchial thickening which may relate to chronic bronchitis or smoking. 3. No posttraumatic deformity identified. Electronically Signed   By: Abigail Miyamoto M.D.   On: 09/10/2015 12:56   Dg Pelvis Portable  09/11/2015  CLINICAL DATA:  Postop from right hip arthroplasty. EXAM: PORTABLE PELVIS 1-2 VIEWS COMPARISON:  None. FINDINGS: Bipolar right hip prosthesis seen in appropriate position. No evidence of fracture or dislocation. No other significant bone abnormality identified. IMPRESSION: Expected postoperative appearance of bipolar right hip prosthesis. Electronically Signed   By: Earle Gell M.D.   On: 09/11/2015 11:51   Dg Hip Unilat With Pelvis 2-3 Views Right  09/10/2015  CLINICAL DATA:  [place] this morning while helping a friend carry groceries, dog ran out and caused fall, RIGHT hip pain, unable to fully extend leg, stuck in lateral position EXAM: DG HIP (WITH OR WITHOUT PELVIS) 2-3V RIGHT COMPARISON:  None FINDINGS: Osseous demineralization. Symmetric hip and SI joints. Displaced subcapital fracture RIGHT femoral neck. No dislocation. Pelvis appears intact. IMPRESSION: Displaced subcapital fracture RIGHT femoral neck. Electronically Signed   By: Lavonia Dana M.D.   On: 09/10/2015 12:57    Scheduled Meds: . [START ON 09/12/2015] aspirin EC  325 mg Oral Q breakfast  . bisacodyl  5-10 mg Oral Daily  .   ceFAZolin (ANCEF) IV  2 g Intravenous Q6H  . dicyclomine  10 mg Oral TID AC  . fluticasone  1 spray Each Nare Daily  . gabapentin  800 mg Oral TID  . heparin  5,000 Units Subcutaneous 3 times per day  . HYDROmorphone      . insulin aspart  0-9 Units Subcutaneous TID WC  . levothyroxine  25 mcg Oral QAC breakfast  . metoCLOPramide      . nortriptyline  25 mg Oral TID  . ondansetron      . oxyCODONE      . pantoprazole  40 mg Oral Daily  . PARoxetine  20 mg Oral Daily   Continuous Infusions: . sodium chloride    . lactated ringers 10 mL/hr at 09/11/15 2482    Principal Problem:   Closed right hip fracture [facility]) Active Problems:   Neurodermatitis  Hyperlipemia   Depression and Anxiety   Type 2 diabetes mellitus without complication ([place])   Hypokalemia   Hypothyroidism   GERD without esophagitis   Leukocytosis    Time spent: 25 minutes    Ziara Thelander, [place]  [facility] Pager [phone]. If 7PM-7AM, please contact night-coverage at [email or web address], password [facility] 09/11/2015, 3:12 PM  LOS: 1 day

## 2015-09-11 NOTE — Transfer of Care (Signed)
Immediate Anesthesia Transfer of Care Note  Patient: Erin [name]  Procedure(s) Performed: Procedure(s): TOTAL HIP ARTHROPLASTY  Patient Location: PACU  Anesthesia Type:General  Level of Consciousness: sedated  Airway & Oxygen Therapy: Patient Spontanous Breathing and Patient connected to nasal cannula oxygen  Post-op Assessment: Report given to RN, Post -op Vital signs reviewed and stable and Patient moving all extremities X 4  Post vital signs: Reviewed and stable  Last Vitals:  Filed Vitals:   09/11/15 0442  BP: 128/69  Pulse: 84  Temp: 36.8 C  Resp: 14    Complications: No apparent anesthesia complications

## 2015-09-11 NOTE — Anesthesia Postprocedure Evaluation (Signed)
  Anesthesia Post-op Note  Patient: Erin [name]  Procedure(s) Performed: Procedure(s): TOTAL HIP ARTHROPLASTY  Patient Location: PACU  Anesthesia Type:General  Level of Consciousness: awake  Airway and Oxygen Therapy: Patient Spontanous Breathing and Patient connected to nasal cannula oxygen  Post-op Pain: none  Post-op Assessment: Post-op Vital signs reviewed, Patient's Cardiovascular Status Stable, Respiratory Function Stable, Patent Airway, No signs of Nausea or vomiting and Pain level controlled LLE Motor Response: Purposeful movement LLE Sensation: Full sensation RLE Motor Response: Purposeful movement RLE Sensation: Full sensation      Post-op Vital Signs: Reviewed and stable  Last Vitals:  Filed Vitals:   09/11/15 1145  BP: 103/58  Pulse: 70  Temp:   Resp: 13    Complications: No apparent anesthesia complications

## 2015-09-11 NOTE — Discharge Instructions (Signed)
INSTRUCTIONS AFTER JOINT REPLACEMENT   o Remove items at home which could result in a fall. This includes throw rugs or furniture in walking pathways o ICE to the affected joint every three hours while awake for 30 minutes at a time, for at least the first 3-5 days, and then as needed for pain and swelling.  Continue to use ice for pain and swelling. You may notice swelling that will progress down to the foot and ankle.  This is normal after surgery.  Elevate your leg when you are not up walking on it.   o Continue to use the breathing machine you got in the hospital (incentive spirometer) which will help keep your temperature down.  It is common for your temperature to cycle up and down following surgery, especially at night when you are not up moving around and exerting yourself.  The breathing machine keeps your lungs expanded and your temperature down.   DIET:  As you were doing prior to hospitalization, we recommend a well-balanced diet.  DRESSING / WOUND CARE / SHOWERING  Keep the surgical dressing until follow up.   IF THE DRESSING FALLS OFF or the wound gets wet inside, change the dressing with sterile gauze.  Please use good hand washing techniques before changing the dressing.  Do not use any lotions or creams on the incision until instructed by your surgeon.    ACTIVITY  o Increase activity slowly as tolerated, but follow the weight bearing instructions below.   o No driving for 6 weeks or until further direction given by your physician.  You cannot drive while taking narcotics.  o No lifting or carrying greater than 10 lbs. until further directed by your surgeon. o Avoid periods of inactivity such as sitting longer than an hour when not asleep. This helps prevent blood clots.  o You may return to work once you are authorized by your doctor.     WEIGHT BEARING   Weight bearing as tolerated with assist device (walker, cane, etc) as directed, use it as long as suggested by your  surgeon or therapist, typically at least 4-6 weeks.   CONSTIPATION  Constipation is defined medically as fewer than three stools per week and severe constipation as less than one stool per week.  Even if you have a regular bowel pattern at home, your normal regimen is likely to be disrupted due to multiple reasons following surgery.  Combination of anesthesia, postoperative narcotics, change in appetite and fluid intake all can affect your bowels.   YOU MUST use at least one of the following options; they are listed in order of increasing strength to get the job done.  They are all available over the counter, and you may need to use some, POSSIBLY even all of these options:    Drink plenty of fluids (prune juice may be helpful) and high fiber foods Colace 100 mg by mouth twice a day  Senokot for constipation as directed and as needed Dulcolax (bisacodyl), take with full glass of water  Miralax (polyethylene glycol) once or twice a day as needed.  If you have tried all these things and are unable to have a bowel movement in the first 3-4 days after surgery call either your surgeon or your primary doctor.    If you experience loose stools or diarrhea, hold the medications until you stool forms back up.  If your symptoms do not get better within 1 week or if they get worse, check with your doctor.  If you experience "the worst abdominal pain ever" or develop nausea or vomiting, please contact the office immediately for further recommendations for treatment.   ITCHING:  If you experience itching with your medications, try taking only a single pain pill, or even half a pain pill at a time.  You can also use Benadryl over the counter for itching or also to help with sleep.   TED HOSE STOCKINGS:  Use stockings on both legs until for at least 2 weeks or as directed by physician office. They may be removed at night for sleeping.  MEDICATIONS:  See your medication summary on the After Visit Summary  that nursing will review with you.  You may have some home medications which will be placed on hold until you complete the course of blood thinner medication.  It is important for you to complete the blood thinner medication as prescribed.  PRECAUTIONS:  If you experience chest pain or shortness of breath - call 911 immediately for transfer to the hospital emergency department.   If you develop a fever greater that 101 F, purulent drainage from wound, increased redness or drainage from wound, foul odor from the wound/dressing, or calf pain - CONTACT YOUR SURGEON.                                                   FOLLOW-UP APPOINTMENTS:  If you do not already have a post-op appointment, please call the office for an appointment to be seen by your surgeon.  Guidelines for how soon to be seen are listed in your After Visit Summary, but are typically between 1-4 weeks after surgery.  OTHER INSTRUCTIONS:   MAKE SURE YOU:   Understand these instructions.   Get help right away if you are not doing well or get worse.    Thank you for letting us be a part of your medical care team.  It is a privilege we respect greatly.  We hope these instructions will help you stay on track for a fast and full recovery!

## 2015-09-11 NOTE — Evaluation (Signed)
Physical Therapy Evaluation Patient Details Name: Erin [name] MRN: [id number] DOB: 1956-08-10 Today's Date: 09/11/2015   History of Present Illness  Erin [name] is a 59 y.o. female with PMH significant for diabetes type 2 (diet controlled), anxiety/depression, hypothyroidism, neurodermatitis, GERD and HLD; who presented to ED after experiencing mechanical fall at home; positive for subcapital neck fracture on x-ray. S/p right total hip arthroplasty.  Clinical Impression  Patient is s/p above surgery presenting with functional limitations due to the deficits listed below (see PT Problem List). Eager to work with therapy, tolerated gait training up to 30 feet post op day #0. Limited Rt hip flexion but able to clear Rt foot with strong effort and cues. No buckling noted. Reviewed therapeutic exercises. Anticipate she will progress quickly and has good family support at discharge. Patient will benefit from skilled PT to increase their independence and safety with mobility to allow discharge to the venue listed below.       Follow Up Recommendations Home health PT;Supervision for mobility/OOB    Equipment Recommendations  None recommended by PT    Recommendations for Other Services OT consult     Precautions / Restrictions Precautions Precautions: None;Fall (Direct anterior approach) Restrictions Weight Bearing Restrictions: Yes RLE Weight Bearing: Weight bearing as tolerated      Mobility  Bed Mobility Overal bed mobility: Needs Assistance Bed Mobility: Supine to Sit     Supine to sit: Min assist     General bed mobility comments: Min assist for pt to pull through PTs hand. VC for technique throughout.  Transfers Overall transfer level: Needs assistance Equipment used: Rolling walker (2 wheeled) Transfers: Sit to/from Stand Sit to Stand: Min assist         General transfer comment: Min assist for boost to stand from lowest bed setting. Leans heavily to  anterior. VC for upright posture. Good WB through RLE without buckling.  Ambulation/Gait Ambulation/Gait assistance: Min guard Ambulation Distance (Feet): 30 Feet Assistive device: Rolling walker (2 wheeled) Gait Pattern/deviations: Step-to pattern;Decreased step length - right;Decreased step length - left;Decreased stance time - right;Decreased stride length;Antalgic Gait velocity: decreased Gait velocity interpretation: Below normal speed for age/gender General Gait Details: Educated on safe DME use with a rolling walker. VC to keep hands on RW at all times during gait. No buckling noted however demonstrates limited hip flexion on Rt in swing limb advancement phase of gait cycle. Able to clear Rt foot with cues and concentration.   Stairs            Wheelchair Mobility    Modified Rankin (Stroke Patients Only)       Balance Overall balance assessment: Needs assistance;History of Falls Sitting-balance support: No upper extremity supported;Feet supported Sitting balance-Leahy Scale: Good     Standing balance support: No upper extremity supported Standing balance-Leahy Scale: Fair                               Pertinent Vitals/Pain Pain Assessment: 0-10 Pain Score: 4  Pain Location: Rt hip, anterior thigh Pain Descriptors / Indicators: Burning Pain Intervention(s): Monitored during session;Repositioned;Premedicated before session    Home Living Family/patient expects to be discharged to:: Private residence Living Arrangements: Spouse/significant other Available Help at Discharge: Family;Available 24 hours/day Type of Home: House Home Access: Ramped entrance     Home Layout: One level Home Equipment: Walker - 2 wheels;Bedside commode;Shower seat - built in  Prior Function Level of Independence: Independent               Hand Dominance   Dominant Hand: Right    Extremity/Trunk Assessment   Upper Extremity Assessment: Defer to OT  evaluation           Lower Extremity Assessment: RLE deficits/detail RLE Deficits / Details: decreased strength and ROM as expected post op. Limited active hip flexion       Communication   Communication: No difficulties  Cognition Arousal/Alertness: Awake/alert Behavior During Therapy: WFL for tasks assessed/performed Overall Cognitive Status: Within Functional Limits for tasks assessed                      General Comments General comments (skin integrity, edema, etc.): Husband present, very supportive. Reviewed safety with mobility, and light exercises post-op.    Exercises Total Joint Exercises Ankle Circles/Pumps: AROM;Both;10 reps;Seated Quad Sets: Strengthening;Both;10 reps;Seated Heel Slides: AAROM;Right;10 reps;Seated      Assessment/Plan    PT Assessment Patient needs continued PT services  PT Diagnosis Difficulty walking;Abnormality of gait;Acute pain   PT Problem List Decreased strength;Decreased range of motion;Decreased activity tolerance;Decreased balance;Decreased mobility;Decreased knowledge of use of DME;Pain  PT Treatment Interventions DME instruction;Gait training;Functional mobility training;Therapeutic activities;Therapeutic exercise;Balance training;Neuromuscular re-education;Patient/family education;Modalities   PT Goals (Current goals can be found in the Care Plan section) Acute Rehab PT Goals Patient Stated Goal: Go home tomorrow PT Goal Formulation: With patient/family Time For Goal Achievement: 09/18/15 Potential to Achieve Goals: Good    Frequency Min 5X/week   Barriers to discharge        Co-evaluation               End of Session Equipment Utilized During Treatment: Gait belt Activity Tolerance: Patient tolerated treatment well Patient left: in chair;with call bell/phone within reach;with family/visitor present Nurse Communication: Mobility status         Time: 1736-1800 PT Time Calculation (min) (ACUTE ONLY):  24 min   Charges:   PT Evaluation $Initial PT Evaluation Tier I: 1 Procedure PT Treatments $Gait Training: 8-22 mins   PT G CodesEllouise Newer 09/11/2015, 6:11 PM Camille Bal [place], [place]

## 2015-09-11 NOTE — Progress Notes (Signed)
reportg iven to [employer] as caregiver

## 2015-09-11 NOTE — Care Management (Signed)
Utilization review completed. Nahara Dona, RN Case Manager [phone]. 

## 2015-09-11 NOTE — Interval H&P Note (Signed)
History and Physical Interval Note:  09/11/2015 5:50 AM  Erin [name]  has presented today for surgery, with the diagnosis of closed hip right fracture  The various methods of treatment have been discussed with the patient and family. After consideration of risks, benefits and other options for treatment, the patient has consented to  Procedure(s): ARTHROPLASTY BIPOLAR HIP (HEMIARTHROPLASTY) (Right) as a surgical intervention .  The patient's history has been reviewed, patient examined, no change in status, stable for surgery.  I have reviewed the patient's chart and labs.  Questions were answered to the patient's satisfaction.     Merve Hotard D

## 2015-09-11 NOTE — Progress Notes (Signed)
Initial Nutrition Assessment  DOCUMENTATION CODES:   Non-severe (moderate) malnutrition in context of chronic illness   Pt meets criteria for modertate MALNUTRITION in the context of chronic illness as evidenced by energy intake </= 75% for >/= 1 month and moderate muscle mass loss.  INTERVENTION:   Once diet advances, provide nourishment snacks (Magic Cup, ice cream, or pudding) between meals.   Encourage adequate PO intake.   NUTRITION DIAGNOSIS:   Increased nutrient needs related to  (s/p surgery) as evidenced by estimated needs.  GOAL:   Patient will meet greater than or equal to 90% of their needs  MONITOR:   PO intake, Diet advancement, Weight trends, Labs, I & O's  REASON FOR ASSESSMENT:   Consult Hip fracture protocol  ASSESSMENT:   59 y.o. female with PMH significant for diabetes type 2 (diet controlled), anxiety/depression, hypothyroidism, neurodermatitis, GERD and HLD; who presented to ED after experiencing mechanical fall at home and developed excruciating right hip pain. In ED hip x-ray was positive for subcapital neck fracture; blood work up with mild hypokalemia and some leukocytosis.   PROCEDURE:(10/21): TOTAL HIP ARTHROPLASTY  Pt reports hunger during time of visit. PTA pt does reports po intake has not been very well due to IBS. She reports eating 1 meal a day with snacks as her usual intake. Pt reports weight has been stable however with usual body weight of ~115-120 lbs. Pt has been advanced to a clear liquid diet. Pt was offered Boost Breeze to aid in healing, however refused. Once diet advances, provide pt with nourishment snacks between meals (Magic cup, ice cream, and/or pudding). Pt was encouraged to eat her food at meals.  Nutrition-Focused physical exam completed. Findings are no fat depletion, mild to moderate muscle depletion, and no edema.   Labs and medications reviewed.   Diet Order:  Diet clear liquid Room service appropriate?: Yes; Fluid  consistency:: Thin  Skin:   (Incision on R hip)  Last BM:  10/19  Height:   Ht Readings from Last 1 Encounters:  09/10/15 5\' 3"  (1.6 m)    Weight:   Wt Readings from Last 1 Encounters:  09/10/15 117 lb 14.4 oz (53.479 kg)    Ideal Body Weight:  52.27 kg  BMI:  Body mass index is 20.89 kg/(m^2).  Estimated Nutritional Needs:   Kcal:  1650-1850  Protein:  70-80 grams  Fluid:  1.6 - 1.8 L/day  EDUCATION NEEDS:   No education needs identified at this time  Corrin Parker, MS, RD, LDN Pager # [phone] After hours/ weekend pager # [phone]

## 2015-09-11 NOTE — Anesthesia Procedure Notes (Signed)
Procedure Name: Intubation Date/Time: 09/11/2015 8:16 AM Performed by: Rush Farmer E Pre-anesthesia Checklist: Patient identified, Emergency Drugs available, Suction available, Patient being monitored and Timeout performed Patient Re-evaluated:Patient Re-evaluated prior to inductionOxygen Delivery Method: Circle system utilized Preoxygenation: Pre-oxygenation with 100% oxygen Intubation Type: IV induction Ventilation: Mask ventilation without difficulty Laryngoscope Size: Mac and 3 Grade View: Grade II Tube type: Oral Tube size: 7.0 mm Number of attempts: 1 Airway Equipment and Method: Stylet Placement Confirmation: ETT inserted through vocal cords under direct vision,  positive ETCO2 and breath sounds checked- equal and bilateral Secured at: 21 cm Tube secured with: Tape Dental Injury: Injury to lip  Comments: AOI per Ladell Pier, RN with Dr. Ermalene Postin supervising. +ETCO2 & BBS=.

## 2015-09-11 NOTE — Op Note (Signed)
09/10/2015 - 09/11/2015  1:02 PM  PATIENT:  Erin [name]   MRN: [id number]  PRE-OPERATIVE DIAGNOSIS:  closed hip right fracture  POST-OPERATIVE DIAGNOSIS:  closed hip fracture right  PROCEDURE:  Procedure(s): TOTAL HIP ARTHROPLASTY  PREOPERATIVE INDICATIONS:    Erin [name] is an 59 y.o. female who has a diagnosis of Closed right hip fracture ([place]) and elected for surgical management.  The risks benefits and alternatives were discussed with the patient including but not limited to the risks of nonoperative treatment, versus surgical intervention including infection, bleeding, nerve injury, periprosthetic fracture, the need for revision surgery, dislocation, leg length discrepancy, blood clots, cardiopulmonary complications, morbidity, mortality, among others, and they were willing to proceed.     OPERATIVE REPORT     SURGEON:   MURPHY, Ernesta Amble, MD    ASSISTANT:  Lovett Calender, PA-C, She was present and scrubbed throughout the case, critical for completion in a timely fashion, and for retraction, instrumentation, and closure.     ANESTHESIA:  General    COMPLICATIONS:  None.     COMPONENTS:  [employer]ker acolade fit femur size 5 with a 36 mm -5 head ball and a PSL acetabular shell size 50 with a  polyethylene liner    PROCEDURE IN DETAIL:   The patient was met in the holding area and  identified.  The appropriate hip was identified and marked at the operative site.  The patient was then transported to the OR  and  placed under general anesthesia.  At that point, the patient was  placed in the supine position and  secured to the operating room table and all bony prominences padded. He received pre-operative antibiotics    The operative lower extremity was prepped from the iliac crest to the distal leg.  Sterile draping was performed.  Time out was performed prior to incision.      Skin incision was made just 2 cm lateral to the ASIS  extending in line with the tensor  fascia lata. Electrocautery was used to control all bleeders. I dissected down sharply to the fascia of the tensor fascia lata was confirmed that the muscle fibers beneath were running posteriorly. I then incised the fascia over the superficial tensor fascia lata in line with the incision. The fascia was elevated off the anterior aspect of the muscle the muscle was retracted posteriorly and protected throughout the case. I then used electrocautery to incise the tensor fascia lata fascia control and all bleeders. Immediately visible was the fat over top of the anterior neck and capsule.  I removed the anterior fat from the capsule and elevated the rectus muscle off of the anterior capsule. I then removed a large time of capsule. The retractors were then placed over the anterior acetabulum as well as around the superior and inferior neck.  I then removed a section of the femoral neck and a napkin ring fashion. Then used the power course to remove the femoral head from the acetabulum and thoroughly irrigated the acetabulum. I sized the femoral head.    I then exposed the deep acetabulum, cleared out any tissue including the ligamentum teres.   After adequate visualization, I excised the labrum, and then sequentially reamed.  I placed the trial acetabulum, which seated nicely, and then impacted the real cup into place.  Appropriate version and inclination was confirmed clinically matching their bony anatomy, and also with the use transverse acetabular ligament.  I placed a 81mm screw in the posterior/superio position with  an excellent bite.    I then placed the polyethylene liner in place  I then abducted the leg and released the external rotators from the posterior femur allowing it to be easily delivered up lateral and anterior to the acetabulum for preparation of the femoral canal.    I then prepared the proximal femur using the cookie-cutter and then sequentially reamed and broached.  A trial broach,  neck, and head was utilized, and I reduced the hip and it was found to have excellent stability with functional range of motion..  I then impacted the real femoral prosthesis into place into the appropriate version, slightly anteverted to the normal anatomy, and I impacted the real head ball into place. The hip was then reduced and taken through functional range of motion and found to have excellent stability. Leg lengths were restored.  I then irrigated the hip copiously again with, and repaired the fascia with Vicryl, followed by monocryl for the subcutaneous tissue, Monocryl for the skin, Steri-Strips and sterile gauze. The patient was then awakened and returned to PACU in stable and satisfactory condition. There were no complications.  POST OPERATIVE PLAN: WBAT, DVT px: SCD's/TED and ASA 325  Erin Lynch, MD Orthopedic Surgeon [phone]   This note was generated using a template and dragon dictation system. In light of that, I have reviewed the note and all aspects of it are applicable to this case. Any dictation errors are due to the computerized dictation system.

## 2015-09-12 DIAGNOSIS — E538 Deficiency of other specified B group vitamins: Secondary | ICD-10-CM

## 2015-09-12 DIAGNOSIS — E440 Moderate protein-calorie malnutrition: Secondary | ICD-10-CM

## 2015-09-12 DIAGNOSIS — R509 Fever, unspecified: Secondary | ICD-10-CM

## 2015-09-12 LAB — CBC
HEMATOCRIT: 27.8 % — AB (ref 36.0–46.0)
HEMOGLOBIN: 9.2 g/dL — AB (ref 12.0–15.0)
MCH: 30.9 pg (ref 26.0–34.0)
MCHC: 33.1 g/dL (ref 30.0–36.0)
MCV: 93.3 fL (ref 78.0–100.0)
Platelets: 223 10*3/uL (ref 150–400)
RBC: 2.98 MIL/uL — ABNORMAL LOW (ref 3.87–5.11)
RDW: 14.4 % (ref 11.5–15.5)
WBC: 8.7 10*3/uL (ref 4.0–10.5)

## 2015-09-12 LAB — URINALYSIS, ROUTINE W REFLEX MICROSCOPIC
Bilirubin Urine: NEGATIVE
Glucose, UA: NEGATIVE mg/dL
HGB URINE DIPSTICK: NEGATIVE
Ketones, ur: NEGATIVE mg/dL
Leukocytes, UA: NEGATIVE
Nitrite: NEGATIVE
Protein, ur: NEGATIVE mg/dL
SPECIFIC GRAVITY, URINE: 1.005 (ref 1.005–1.030)
UROBILINOGEN UA: 0.2 mg/dL (ref 0.0–1.0)
pH: 6 (ref 5.0–8.0)

## 2015-09-12 LAB — GLUCOSE, CAPILLARY
GLUCOSE-CAPILLARY: 103 mg/dL — AB (ref 65–99)
GLUCOSE-CAPILLARY: 112 mg/dL — AB (ref 65–99)
Glucose-Capillary: 114 mg/dL — ABNORMAL HIGH (ref 65–99)

## 2015-09-12 MED ORDER — SODIUM CHLORIDE 0.9 % IV SOLN
INTRAVENOUS | Status: AC
  Administered 2015-09-12 (×2): via INTRAVENOUS

## 2015-09-12 MED ORDER — SODIUM CHLORIDE 0.9 % IV BOLUS (SEPSIS)
500.0000 mL | Freq: Once | INTRAVENOUS | Status: AC
  Administered 2015-09-12: 500 mL via INTRAVENOUS

## 2015-09-12 NOTE — Evaluation (Signed)
Occupational Therapy Evaluation Patient Details Name: Erin [name] MRN: [id number] DOB: December 28, 1955 Today's Date: 09/12/2015    History of Present Illness Erin [name] is a 59 y.o. female with PMH significant for diabetes type 2 (diet controlled), anxiety/depression, hypothyroidism, neurodermatitis, GERD and HLD; who presented to ED after experiencing mechanical fall at home; positive for subcapital neck fracture on x-ray. S/p right total hip arthroplasty.   Clinical Impression   Pt s/p above. Education provided in session and feel pt is safe to d/c home, from OT standpoint with spouse available to assist. OT signing off.    Follow Up Recommendations  No OT follow up;Supervision - Intermittent    Equipment Recommendations  None recommended by OT    Recommendations for Other Services       Precautions / Restrictions Precautions Precautions: Fall Restrictions Weight Bearing Restrictions: Yes RLE Weight Bearing: Weight bearing as tolerated      Mobility Bed Mobility Overal bed mobility: Needs Assistance Bed Mobility: Sit to Supine       Sit to supine: Supervision   General bed mobility comments: cue to hook left foot behind RLE to help assist   Transfers Overall transfer level: Needs assistance Transfers: Sit to/from Stand Sit to Stand: Supervision         General transfer comment: RW in front. Cue for hand placement    Balance    Min assist for simulated shower transfer. No LOB with RW in session. History of falls.                                        ADL Overall ADL's : Needs assistance/impaired Eating/Feeding: Independent;Sitting   Grooming: Wash/dry hands;Set up;Supervision/safety;Standing               Lower Body Dressing: Supervision/safety;Sit to/from stand;Set up   Toilet Transfer: Supervision/safety;Ambulation;RW (3 in 1 over commode)       Tub/ Shower Transfer: Walk-in shower;Minimal assistance;Ambulation  (Used RW to ambulate up to simulated shower; stepped over without RW)   Functional mobility during ADLs: Minimal assistance (Min A for simulated shower transfer,otherwise supervision) General ADL Comments: Educated on LB dressing technique. Educated on safety such as use of bag on walker, safe footwear, rugs/items on floor, and sitting for LB ADLs. Recommended pt have spouse with her for shower transfer. Educated on shower transfer technique. Briefly explained use of reacher.      Vision     Perception     Praxis      Pertinent Vitals/Pain Pain Assessment: 0-10 Pain Score:  (3-sitting still and 5-moving Rt hip) Pain Location: Rt hip Pain Intervention(s): Monitored during session     Hand Dominance Right   Extremity/Trunk Assessment Upper Extremity Assessment Upper Extremity Assessment: Overall WFL for tasks assessed   Lower Extremity Assessment Lower Extremity Assessment: Defer to PT evaluation       Communication Communication Communication: No difficulties   Cognition Arousal/Alertness: Awake/alert Behavior During Therapy: WFL for tasks assessed/performed Overall Cognitive Status: Within Functional Limits for tasks assessed                     General Comments       Exercises       Shoulder Instructions      Home Living Family/patient expects to be discharged to:: Private residence Living Arrangements: Spouse/significant other Available Help at Discharge: Family;Available 24 hours/day Type of  Home: House Home Access: Ramped entrance     Home Layout: One level     Bathroom Shower/Tub: Walk-in shower;Door   Bathroom Toilet: Handicapped height     Home Equipment: Environmental consultant - 2 wheels;Bedside commode;Shower seat - built in;Hand held shower head;Grab bars - toilet;Grab bars - tub/shower          Prior Functioning/Environment Level of Independence: Independent             OT Diagnosis: Acute pain   OT Problem List:     OT  Treatment/Interventions:      OT Goals(Current goals can be found in the care plan section)    OT Frequency:     Barriers to D/C:            Co-evaluation              End of Session Equipment Utilized During Treatment: Rolling walker  Activity Tolerance: Patient tolerated treatment well Patient left: in bed;with family/visitor present   Time: 0051-1021 OT Time Calculation (min): 19 min Charges:  OT General Charges $OT Visit: 1 Procedure OT Evaluation $Initial OT Evaluation Tier I: 1 Procedure G-CodesBenito Mccreedy OTR/L [id number] 09/12/2015, 10:28 AM

## 2015-09-12 NOTE — Progress Notes (Signed)
[facility] PROGRESS NOTE  Erin [name] TKW:[id number] DOB: [birth year] DOA: 09/10/2015 PCP: Erin Kern., DO  59 y.o. female with PMH significant for diabetes type 2 (diet controlled), anxiety/depression, hypothyroidism, neurodermatitis, GERD and HLD; who presented to ED after experiencing mechanical fall at home and sustained a subcapital fracture of rt femoral neck. Seen by ortho and transferred to [name] for surgery. Underwent total hip arthroplasty on 10/21.  Assessment/Plan: Closed right hip fracture (Erin [name]):  secondary to mechanical fall S/p total hip arthroplasty today. tolerated well. Ordered PT. Pain control with prn oxycodone and robaxin. Added bowel regimen. lovenox for DVT prophylaxis.  PT eval in am. Pt does not wish to go to SNF.  Neurodermatitis: continue neurontin  B12 Deficiency  As noted outpt. Pt reports being started on 1000ug b12 daily. Will order.  Hypothyroidism Recently started on synthroid as outpt.  Depression and Anxiety stable  continue home  regimen (paxil and pamelor)  Type 2 DM, diet controlled -A1C of 5.6 -monitor on SSI  Hypokalemia replete  Fever on 10/21  tmax of 100.8. Check UA.   Constipation  on bowel regimen without effect. Ordered enema    DVT prophylaxis: sq lovenox and full dose ASA  diet: regular  Code Status: full  Family Communication: husband at bedside Disposition Plan: d/c home with HHPT in am. Monitor overnight given fever last evening   Consultants:  Dr Erin [name]  Procedures:  ORIF rt hip on 10/21  Antibiotics:  perioperative  HPI/Subjective: Reports mild pain in rt hip. Had fever of 100.8 last evening. Reports being constipated for past 5 days  Objective: Filed Vitals:   09/12/15 0614  BP: 94/53  Pulse: 108  Temp: 98.7 F (37.1 C)  Resp: 16    Intake/Output Summary (Last 24 hours) at 09/12/15 1410 Last data filed at 09/11/15 1829  Gross per 24 hour  Intake    240 ml  Output       0 ml  Net    240 ml   Filed Weights   09/10/15 1742  Weight: 53.479 kg (117 lb 14.4 oz)    Exam:   General:  NAD  HEENT: no pallor, moist mucosa   chest: clear b/l   CVS: NS1&S2  GI: soft, NT, ND  Musculoskeletal: dressing over rt hip. No edema  Data Reviewed: Basic Metabolic Panel:  Recent Labs Lab 09/10/15 1309 09/10/15 1917 09/11/15 0527  NA 138  --  141  K 3.4*  --  3.6  CL 103  --  105  CO2 26  --  28  GLUCOSE 125*  --  93  BUN 7  --  <5*  CREATININE 0.69 0.74 0.60  CALCIUM 8.8*  --  8.7*   Liver Function Tests:  Recent Labs Lab 09/10/15 1309  AST 17  ALT 12*  ALKPHOS 131*  BILITOT 0.4  PROT 7.2  ALBUMIN 4.0   No results for input(s): LIPASE, AMYLASE in the last 168 hours. No results for input(s): AMMONIA in the last 168 hours. CBC:  Recent Labs Lab 09/10/15 1309 09/10/15 1917 09/11/15 0527 09/12/15 0340  WBC 12.2* 9.9 5.1 8.7  HGB 11.4* 11.4* 10.8* 9.2*  HCT 34.6* 34.4* 33.5* 27.8*  MCV 92.5 93.5 93.3 93.3  PLT 349 290 286 223   Cardiac Enzymes: No results for input(s): CKTOTAL, CKMB, CKMBINDEX, TROPONINI in the last 168 hours. BNP (last 3 results) No results for input(s): BNP in the last 8760 hours.  ProBNP (last 3 results) No results  for input(s): PROBNP in the last 8760 hours.  CBG:  Recent Labs Lab 09/11/15 1005 09/11/15 1307 09/11/15 1700 09/11/15 2128 09/12/15 1158  GLUCAP 112* 113* 85 95 112*    Recent Results (from the past 240 hour(s))  Surgical pcr screen     Status: None   Collection Time: 09/10/15  6:53 PM  Result Value Ref Range Status   MRSA, PCR NEGATIVE NEGATIVE Final   Staphylococcus aureus NEGATIVE NEGATIVE Final    Comment:        The Xpert SA Assay (FDA approved for NASAL specimens in patients over 87 years of age), is one component of a comprehensive surveillance program.  Test performance has been validated by [facility] for patients greater than or equal to 65 year old. It is not  intended to diagnose infection nor to guide or monitor treatment.      Studies: Dg Pelvis Portable  09/11/2015  CLINICAL DATA:  Postop from right hip arthroplasty. EXAM: PORTABLE PELVIS 1-2 VIEWS COMPARISON:  None. FINDINGS: Bipolar right hip prosthesis seen in appropriate position. No evidence of fracture or dislocation. No other significant bone abnormality identified. IMPRESSION: Expected postoperative appearance of bipolar right hip prosthesis. Electronically Signed   By: Erin [name] M.D.   On: 09/11/2015 11:51    Scheduled Meds: . aspirin EC  325 mg Oral Q breakfast  . bisacodyl  5-10 mg Oral Daily  . dicyclomine  10 mg Oral TID AC  . enoxaparin (LOVENOX) injection  30 mg Subcutaneous Q24H  . fluticasone  1 spray Each Nare Daily  . gabapentin  800 mg Oral TID  . insulin aspart  0-9 Units Subcutaneous TID WC  . levothyroxine  25 mcg Oral QAC breakfast  . nortriptyline  25 mg Oral TID  . pantoprazole  40 mg Oral Daily  . PARoxetine  20 mg Oral Daily  . vitamin B-12  100 mcg Oral Daily   Continuous Infusions: . sodium chloride 75 mL/hr at 09/12/15 0858  . lactated ringers 10 mL/hr at 09/11/15 8891    Principal Problem:   Closed right hip fracture [name]) Active Problems:   Neurodermatitis   Hyperlipemia   Depression and Anxiety   Type 2 diabetes mellitus without complication (HCC)   Hypokalemia   Hypothyroidism   GERD without esophagitis   Leukocytosis   Malnutrition of moderate degree    Time spent: 25 minutes    Erin [name], [place]  [facility] Pager [phone]. If 7PM-7AM, please contact night-coverage at [email or web address], password [facility] 09/12/2015, 2:10 PM  LOS: 2 days

## 2015-09-12 NOTE — Progress Notes (Signed)
Physical Therapy Treatment Patient Details Name: Erin [name] MRN: [id number] DOB: [birth year] Today's Date: 09/12/2015    History of Present Illness [name] is a 59 y.o. female with PMH significant for diabetes type 2 (diet controlled), anxiety/depression, hypothyroidism, neurodermatitis, GERD and HLD; who presented to ED after experiencing mechanical fall at home; positive for subcapital neck fracture on x-ray. S/p right total hip arthroplasty.    PT Comments    Patient is progressing well with mobility and therex. Patient safe to D/C from a mobility standpoint based on progression towards goals set on PT eval.    Follow Up Recommendations  Home health PT;Supervision for mobility/OOB     Equipment Recommendations  None recommended by PT    Recommendations for Other Services       Precautions / Restrictions Restrictions RLE Weight Bearing: Weight bearing as tolerated    Mobility  Bed Mobility               General bed mobility comments: Up in recliner before and after session  Transfers Overall transfer level: Needs assistance Equipment used: Rolling walker (2 wheeled)   Sit to Stand: Supervision         General transfer comment: Patient with safe technique  Ambulation/Gait Ambulation/Gait assistance: Supervision Ambulation Distance (Feet): 300 Feet Assistive device: Rolling walker (2 wheeled) Gait Pattern/deviations: Step-through pattern;Decreased stride length Gait velocity: decreased   General Gait Details: Safe technique. Did not rely on RW.    Stairs            Wheelchair Mobility    Modified Rankin (Stroke Patients Only)       Balance                                    Cognition Arousal/Alertness: Awake/alert Behavior During Therapy: WFL for tasks assessed/performed Overall Cognitive Status: Within Functional Limits for tasks assessed                      Exercises Total Joint  Exercises Quad Sets: AROM;Right;15 reps Heel Slides: AROM;Right;15 reps Hip ABduction/ADduction: AROM;Right;15 reps Long Arc Quad: AROM;Right;15 reps    General Comments        Pertinent Vitals/Pain Pain Score: 4  Pain Location: Rt hip Pain Intervention(s): Limited activity within patient's tolerance    Home Living                      Prior Function            PT Goals (current goals can now be found in the care plan section) Progress towards PT goals: Progressing toward goals    Frequency  Min 5X/week    PT Plan Current plan remains appropriate    Co-evaluation             End of Session Equipment Utilized During Treatment: Gait belt Activity Tolerance: Patient tolerated treatment well Patient left: in chair;with call bell/phone within reach;with family/visitor present     Time: 6073-7106 PT Time Calculation (min) (ACUTE ONLY): 19 min  Charges:  $Gait Training: 8-22 mins                    G Codes:      Jacqualyn Posey 09/12/2015, 8:51 AM 09/12/2015 Kyndall Amero, Tonia Brooms PTA

## 2015-09-12 NOTE — Progress Notes (Signed)
Subjective: 1 Day Post-Op Procedure(s): TOTAL HIP ARTHROPLASTY Patient reports pain as mild.  Patient reports mild nausea but is attributing this to constipation.  No urinary frequency/urgency/burning.  No vomiting.  No lightheadedness/dizziness, chest pain/sob.  Tolerating diet and eager to go home today.    Objective: Vital signs in last 24 hours: Temp:  [97.7 F (36.5 C)-100.8 F (38.2 C)] 98.7 F (37.1 C) (10/22 0614) Pulse Rate:  [69-108] 108 (10/22 0614) Resp:  [13-18] 16 (10/22 0614) BP: (94-123)/(53-72) 94/53 mmHg (10/22 0614) SpO2:  [100 %] 100 % (10/22 0614)  Intake/Output from previous day: 10/21 0701 - 10/22 0700 In: 940 [P.O.:240; I.V.:700] Out: 250 [Blood:250] Intake/Output this shift:     Recent Labs  09/10/15 1309 09/10/15 1917 09/11/15 0527 09/12/15 0340  HGB 11.4* 11.4* 10.8* 9.2*    Recent Labs  09/11/15 0527 09/12/15 0340  WBC 5.1 8.7  RBC 3.59* 2.98*  HCT 33.5* 27.8*  PLT 286 223    Recent Labs  09/10/15 1309 09/10/15 1917 09/11/15 0527  NA 138  --  141  K 3.4*  --  3.6  CL 103  --  105  CO2 26  --  28  BUN 7  --  <5*  CREATININE 0.69 0.74 0.60  GLUCOSE 125*  --  93  CALCIUM 8.8*  --  8.7*   No results for input(s): LABPT, INR in the last 72 hours.  Neurologically intact Neurovascular intact Sensation intact distally Intact pulses distally Dorsiflexion/Plantar flexion intact Compartment soft  Negative homans bilaterally No drainage noted through dressing  Assessment/Plan: 1 Day Post-Op Procedure(s): TOTAL HIP ARTHROPLASTY Advance diet Up with therapy  WBAT RLE ABLA-mild and stable Dry dressing change prn Elevated temperature last night-patient has not passed gas or had a bm.  Likely from constipation, but medicine to r/o UTI Ok to d/c home with hhpt today from ortho standpoint, but this will be up to medicine team Continue plan per medicine  Fannie Knee 09/12/2015, 10:41 AM

## 2015-09-12 NOTE — Care Management Note (Signed)
Case Management Note  Patient Details  Name: Erin [name] MRN: [id number] Date of Birth: 1956/07/04  Subjective/Objective:                   TOTAL HIP ARTHROPLASTY Action/Plan:  DISCHARGE PLANNING Expected Discharge Date:   (unknown)               Expected Discharge Plan:  [place]  In-House Referral:     Discharge planning Services  CM Consult  Post Acute Care Choice:  Home Health Choice offered to:  Patient  DME Arranged:  N/A DME Agency:  NA  HH Arranged:  PT [place] Agency:  [place]  Status of Service:  In process, will continue to follow  Medicare Important Message Given:    Date Medicare IM Given:    Medicare IM give by:    Date Additional Medicare IM Given:    Additional Medicare Important Message give by:     If discussed at [place] of Stay Meetings, dates discussed:    Additional Comments: CM spoke with pt for choice of home health agency. Pt chooses [facility] to render HHPT.  Pt has rolling walker in room.  Address and contact information verified with pt.  Referral called to [facility] rep, Tiffany.  No other CM needs were communicated. Dellie Catholic, RN 09/12/2015, 4:19 PM

## 2015-09-12 NOTE — Progress Notes (Signed)
Instructed patient with next void - to use "hat" to collect urine; then call/ notify urine ready for lab U/A C&S. Patient agreed.

## 2015-09-12 NOTE — Progress Notes (Signed)
U/A C&S obtained - will be taken to the lab.

## 2015-09-13 DIAGNOSIS — I9589 Other hypotension: Secondary | ICD-10-CM

## 2015-09-13 DIAGNOSIS — D62 Acute posthemorrhagic anemia: Secondary | ICD-10-CM

## 2015-09-13 LAB — CBC
HCT: 24.2 % — ABNORMAL LOW (ref 36.0–46.0)
Hemoglobin: 7.8 g/dL — ABNORMAL LOW (ref 12.0–15.0)
MCH: 30 pg (ref 26.0–34.0)
MCHC: 32.2 g/dL (ref 30.0–36.0)
MCV: 93.1 fL (ref 78.0–100.0)
PLATELETS: 218 10*3/uL (ref 150–400)
RBC: 2.6 MIL/uL — ABNORMAL LOW (ref 3.87–5.11)
RDW: 14.6 % (ref 11.5–15.5)
WBC: 6.5 10*3/uL (ref 4.0–10.5)

## 2015-09-13 LAB — URINE CULTURE: CULTURE: NO GROWTH

## 2015-09-13 LAB — PREPARE RBC (CROSSMATCH)

## 2015-09-13 LAB — ABO/RH: ABO/RH(D): A POS

## 2015-09-13 LAB — CORTISOL: Cortisol, Plasma: 10.1 ug/dL

## 2015-09-13 LAB — GLUCOSE, CAPILLARY: GLUCOSE-CAPILLARY: 103 mg/dL — AB (ref 65–99)

## 2015-09-13 MED ORDER — SODIUM CHLORIDE 0.9 % IV BOLUS (SEPSIS)
1000.0000 mL | Freq: Once | INTRAVENOUS | Status: AC
  Administered 2015-09-13: 1000 mL via INTRAVENOUS

## 2015-09-13 MED ORDER — VITAMIN B-12 1000 MCG PO TABS
1000.0000 ug | ORAL_TABLET | Freq: Every day | ORAL | Status: DC
  Administered 2015-09-14: 1000 ug via ORAL
  Filled 2015-09-13: qty 1

## 2015-09-13 MED ORDER — SODIUM CHLORIDE 0.9 % IV SOLN
Freq: Once | INTRAVENOUS | Status: AC
  Administered 2015-09-13: 11:00:00 via INTRAVENOUS

## 2015-09-13 NOTE — Progress Notes (Signed)
[facility] PROGRESS NOTE  Erin [name] TIR:[id number] DOB: March 09, 1956 DOA: 09/10/2015 PCP: [name], DO  59 y.o. female with PMH significant for diabetes type 2 (diet controlled), anxiety/depression, hypothyroidism, neurodermatitis, GERD and HLD; who presented to ED after experiencing mechanical fall at home and sustained a subcapital fracture of rt femoral neck. Seen by ortho and transferred to [place] for surgery. Underwent total hip arthroplasty on 10/21.  Assessment/Plan: Closed right hip fracture [facility]):  secondary to mechanical fall S/p total hip arthroplasty on 10/21. tolerated well. . Pain control with prn oxycodone and robaxin. Added bowel regimen. lovenox and ASA for DVT prophylaxis.  PT recommended home health.   Hypotension with postoperative blood loss anemia Ordered 1 L normal saline bolus followed by maintenance fluid. Ordered 1 unit PRBC. Check cortisol level. Ordered feeling dizzy this morning.  Neurodermatitis: continue neurontin  B12 Deficiency  As noted outpt. Pt reports being started on 1000ug b12 daily and has been resumed.  Hypothyroidism Recently started on synthroid as outpt.  Depression and Anxiety stable  continue home  regimen (paxil and pamelor)  Type 2 DM, diet controlled -A1C of 5.6 -monitor on SSI  Hypokalemia replete  Fever on 10/21  tmax of 100.8. UA unremarkable. Chest x-ray on admission negative for infiltrate. Has low grade fever  past 24 hrs.   Constipation Received enema on 10/22    DVT prophylaxis: sq lovenox and full dose ASA  diet: regular  Code Status: full  Family Communication: husband at bedside Disposition Plan: d/c home with HHPT in am if BP stable   Consultants:  Dr Percell Miller  Procedures:  ORIF rt hip on 10/21  Antibiotics:  perioperative  HPI/Subjective: Patient hypotensive with systolic blood pressure in the 70s. Reports feeling dizzy. Also noted drop in H&H.  Objective: Filed  Vitals:   09/13/15 1136  BP: 107/60  Pulse: 102  Temp: 99.5 F (37.5 C)  Resp: 16    Intake/Output Summary (Last 24 hours) at 09/13/15 1203 Last data filed at 09/13/15 0736  Gross per 24 hour  Intake    480 ml  Output      0 ml  Net    480 ml   Filed Weights   09/10/15 1742  Weight: 53.479 kg (117 lb 14.4 oz)    Exam:   General:  NAD  HEENT: no pallor, moist mucosa   chest: clear b/l   CVS: NS1&S2  GI: soft, NT, ND  Musculoskeletal: dressing over rt hip. No edema  QPY:PPJKD and oriented  Data Reviewed: Basic Metabolic Panel:  Recent Labs Lab 09/10/15 1309 09/10/15 1917 09/11/15 0527  NA 138  --  141  K 3.4*  --  3.6  CL 103  --  105  CO2 26  --  28  GLUCOSE 125*  --  93  BUN 7  --  <5*  CREATININE 0.69 0.74 0.60  CALCIUM 8.8*  --  8.7*   Liver Function Tests:  Recent Labs Lab 09/10/15 1309  AST 17  ALT 12*  ALKPHOS 131*  BILITOT 0.4  PROT 7.2  ALBUMIN 4.0   No results for input(s): LIPASE, AMYLASE in the last 168 hours. No results for input(s): AMMONIA in the last 168 hours. CBC:  Recent Labs Lab 09/10/15 1309 09/10/15 1917 09/11/15 0527 09/12/15 0340 09/13/15 0436  WBC 12.2* 9.9 5.1 8.7 6.5  HGB 11.4* 11.4* 10.8* 9.2* 7.8*  HCT 34.6* 34.4* 33.5* 27.8* 24.2*  MCV 92.5 93.5 93.3 93.3 93.1  PLT  349 290 286 223 218   Cardiac Enzymes: No results for input(s): CKTOTAL, CKMB, CKMBINDEX, TROPONINI in the last 168 hours. BNP (last 3 results) No results for input(s): BNP in the last 8760 hours.  ProBNP (last 3 results) No results for input(s): PROBNP in the last 8760 hours.  CBG:  Recent Labs Lab 09/11/15 1700 09/11/15 2128 09/12/15 0613 09/12/15 1158 09/12/15 1645  GLUCAP 85 95 103* 112* 114*    Recent Results (from the past 240 hour(s))  Surgical pcr screen     Status: None   Collection Time: 09/10/15  6:53 PM  Result Value Ref Range Status   MRSA, PCR NEGATIVE NEGATIVE Final   Staphylococcus aureus NEGATIVE  NEGATIVE Final    Comment:        The Xpert SA Assay (FDA approved for NASAL specimens in patients over 102 years of age), is one component of a comprehensive surveillance program.  Test performance has been validated by [facility] for patients greater than or equal to 79 year old. It is not intended to diagnose infection nor to guide or monitor treatment.      Studies: No results found.  Scheduled Meds: . aspirin EC  325 mg Oral Q breakfast  . bisacodyl  5-10 mg Oral Daily  . dicyclomine  10 mg Oral TID AC  . enoxaparin (LOVENOX) injection  30 mg Subcutaneous Q24H  . fluticasone  1 spray Each Nare Daily  . gabapentin  800 mg Oral TID  . insulin aspart  0-9 Units Subcutaneous TID WC  . levothyroxine  25 mcg Oral QAC breakfast  . nortriptyline  25 mg Oral TID  . pantoprazole  40 mg Oral Daily  . PARoxetine  20 mg Oral Daily  . vitamin B-12  1,000 mcg Oral Daily   Continuous Infusions: . lactated ringers 10 mL/hr at 09/11/15 0736      Time spent: 25 minutes    Glenard Keesling  [facility] Pager [phone]. If 7PM-7AM, please contact night-coverage at [email or web address], password [facility] 09/13/2015, 12:03 PM  LOS: 3 days

## 2015-09-13 NOTE — Progress Notes (Signed)
Physical Therapy Treatment Patient Details Name: Erin [name] MRN: [id number] DOB: [birth year] Today's Date: 09/13/2015    History of Present Illness GWENETH FREDLUND is a 59 y.o. female with PMH significant for diabetes type 2 (diet controlled), anxiety/depression, hypothyroidism, neurodermatitis, GERD and HLD; who presented to ED after experiencing mechanical fall at home; positive for subcapital neck fracture on x-ray. S/p right total hip arthroplasty.    PT Comments    Much improved activity tolerance after receiving 1 unit of blood; no dizziness with amb, and BP was 140/60 after amb and therex  Follow Up Recommendations  Home health PT;Supervision for mobility/OOB     Equipment Recommendations  None recommended by PT    Recommendations for Other Services       Precautions / Restrictions Precautions Precautions: Fall Restrictions RLE Weight Bearing: Weight bearing as tolerated    Mobility  Bed Mobility                  Transfers Overall transfer level: Needs assistance Equipment used: Rolling walker (2 wheeled) Transfers: Sit to/from Stand Sit to Stand: Supervision         General transfer comment: RW in front. Cue for hand placement  Ambulation/Gait Ambulation/Gait assistance: Min guard (without physical contact) Ambulation Distance (Feet): 200 Feet Assistive device: Rolling walker (2 wheeled) Gait Pattern/deviations: Step-through pattern Gait velocity: decreased   General Gait Details: Cues for R foot clearance in swing and to increase heel strike on R; pt states she typically shufles her feet; one instance of spasm-like pain which lasted less than a minute and pt was able to walk after the episode   Stairs            Wheelchair Mobility    Modified Rankin (Stroke Patients Only)       Balance                                    Cognition Arousal/Alertness: Awake/alert Behavior During Therapy: WFL for tasks  assessed/performed;Impulsive Overall Cognitive Status: Within Functional Limits for tasks assessed                      Exercises Total Joint Exercises Ankle Circles/Pumps: AROM;Both;10 reps;Seated Towel Squeeze: AROM;Both;10 reps Hip ABduction/ADduction: AROM;Right;15 reps Long Arc Quad: AROM;Right;15 reps    General Comments        Pertinent Vitals/Pain Pain Assessment: 0-10 Pain Score: 7  Pain Location: R hip and anterior thigh Pain Descriptors / Indicators: Aching Pain Intervention(s): Monitored during session;Premedicated before session;Repositioned    Home Living                      Prior Function            PT Goals (current goals can now be found in the care plan section) Acute Rehab PT Goals Patient Stated Goal: Go home tomorrow PT Goal Formulation: With patient/family Time For Goal Achievement: 09/18/15 Potential to Achieve Goals: Good Progress towards PT goals: Progressing toward goals    Frequency  Min 5X/week    PT Plan Current plan remains appropriate    Co-evaluation             End of Session   Activity Tolerance: Patient tolerated treatment well Patient left: in chair;with call bell/phone within reach;with family/visitor present     Time: 4967-5916 PT Time Calculation (min) (ACUTE ONLY): 26 min  Charges:  $Gait Training: 8-22 mins $Therapeutic Exercise: 8-22 mins                    G Codes:      Quin Hoop 09/13/2015, 3:26 PM  Roney Marion, [place] Pager [phone] Office [phone]

## 2015-09-13 NOTE — Progress Notes (Signed)
Subjective: 2 Days Post-Op Procedure(s): TOTAL HIP ARTHROPLASTY Patient reports pain as moderate.  Patient with increased pain this am although this is still tolerable.  She is also complaining of lightheadedness/dizziness this am.  No nausea/vomiting, chest pain/sob.  Tolerating diet.   Objective: Vital signs in last 24 hours: Temp:  [97.4 F (36.3 C)-100 F (37.8 C)] 98.4 F (36.9 C) (10/23 0533) Pulse Rate:  [92-114] 102 (10/23 0533) Resp:  [16] 16 (10/23 0533) BP: (76-96)/(50-60) 76/50 mmHg (10/23 0533) SpO2:  [92 %-96 %] 92 % (10/23 0533)  Intake/Output from previous day: 10/22 0701 - 10/23 0700 In: 480 [P.O.:480] Out: -  Intake/Output this shift:     Recent Labs  09/10/15 1309 09/10/15 1917 09/11/15 0527 09/12/15 0340 09/13/15 0436  HGB 11.4* 11.4* 10.8* 9.2* 7.8*    Recent Labs  09/12/15 0340 09/13/15 0436  WBC 8.7 6.5  RBC 2.98* 2.60*  HCT 27.8* 24.2*  PLT 223 218    Recent Labs  09/10/15 1309 09/10/15 1917 09/11/15 0527  NA 138  --  141  K 3.4*  --  3.6  CL 103  --  105  CO2 26  --  28  BUN 7  --  <5*  CREATININE 0.69 0.74 0.60  GLUCOSE 125*  --  93  CALCIUM 8.8*  --  8.7*   No results for input(s): LABPT, INR in the last 72 hours.  Neurologically intact Neurovascular intact Sensation intact distally Intact pulses distally Dorsiflexion/Plantar flexion intact Compartment soft No drainage noted through dressing  Assessment/Plan: 2 Days Post-Op Procedure(s): TOTAL HIP ARTHROPLASTY Advance diet Up with therapy WBAT RLE Dry dressing change prn ABLA- patient symptomatic and medicine is transfusing with 1 unit PRBC Ok for d/c to home with hhpt from ortho standpoint.  Patient will be on ASA 325mg  for DVT ppx.  rx in chart.   Fannie Knee 09/13/2015, 8:48 AM

## 2015-09-14 ENCOUNTER — Encounter (HOSPITAL_COMMUNITY): Payer: Self-pay | Admitting: Orthopedic Surgery

## 2015-09-14 ENCOUNTER — Inpatient Hospital Stay (HOSPITAL_COMMUNITY): Payer: No Typology Code available for payment source

## 2015-09-14 ENCOUNTER — Ambulatory Visit: Payer: No Typology Code available for payment source | Admitting: Physical Therapy

## 2015-09-14 DIAGNOSIS — D519 Vitamin B12 deficiency anemia, unspecified: Secondary | ICD-10-CM | POA: Diagnosis present

## 2015-09-14 DIAGNOSIS — Z9660 Presence of unspecified orthopedic joint implant: Secondary | ICD-10-CM

## 2015-09-14 HISTORY — DX: Vitamin B12 deficiency anemia, unspecified: D51.9

## 2015-09-14 LAB — TYPE AND SCREEN
ABO/RH(D): A POS
Antibody Screen: NEGATIVE
Unit division: 0

## 2015-09-14 LAB — GLUCOSE, CAPILLARY: Glucose-Capillary: 109 mg/dL — ABNORMAL HIGH (ref 65–99)

## 2015-09-14 MED ORDER — CYANOCOBALAMIN 1000 MCG PO TABS: 1000.0000 ug | ORAL_TABLET | Freq: Every day | ORAL | Status: DC

## 2015-09-14 NOTE — Progress Notes (Signed)
     Subjective:  [name]. Patient reports pain as moderate.  Resting comfortably in bed this morning.  Received one unit of blood yesterday and reports feeling much better.  Ambulating well with PT.  Good for discharge to home today from ortho standpoint.  Objective:   VITALS:   Filed Vitals:   09/13/15 1215 09/13/15 1510 09/13/15 1932 09/14/15 0556  BP: 96/56 140/65 98/57 106/60  Pulse: 105 109 104 105  Temp: 99.2 F (37.3 C) 99.7 F (37.6 C) 99.6 F (37.6 C) 100.2 F (37.9 C)  TempSrc: Oral Oral Oral Oral  Resp: 16 15 17 17   Height:      Weight:      SpO2: 95% 95% 94% 97%    Neurologically intact ABD soft Neurovascular intact Sensation intact distally Intact pulses distally Dorsiflexion/Plantar flexion intact Incision: dressing C/D/I   Lab Results  Component Value Date   WBC 6.5 09/13/2015   HGB 7.8* 09/13/2015   HCT 24.2* 09/13/2015   MCV 93.1 09/13/2015   PLT 218 09/13/2015   BMET    Component Value Date/Time   NA 141 09/11/2015 0527   NA 139 04/22/2014 1028   K 3.6 09/11/2015 0527   K 4.1 04/22/2014 1028   CL 105 09/11/2015 0527   CO2 28 09/11/2015 0527   CO2 23 04/22/2014 1028   GLUCOSE 93 09/11/2015 0527   GLUCOSE 81 04/22/2014 1028   BUN <5* 09/11/2015 0527   BUN 8.8 04/22/2014 1028   CREATININE 0.60 09/11/2015 0527   CREATININE 0.8 04/22/2014 1028   CALCIUM 8.7* 09/11/2015 0527   CALCIUM 9.1 04/22/2014 1028   GFRNONAA >60 09/11/2015 0527   GFRAA >60 09/11/2015 0527     Assessment/Plan: 3 Days Post-Op   Principal Problem:   Closed right hip fracture (HCC) Active Problems:   Neurodermatitis   Hyperlipemia   Depression and Anxiety   Type 2 diabetes mellitus without complication (HCC)   Hypokalemia   Hypothyroidism   GERD without esophagitis   Leukocytosis   Malnutrition of moderate degree   Up with therapy WBAT in the RLE ASA 325mg  daily for DVT prophylaxis Ok for discharge to home today.  Scripts on the chart.     Erin [name] Erin [name] 09/14/2015, 6:42 AM Cell [phone]

## 2015-09-14 NOTE — Discharge Summary (Signed)
Physician Discharge Summary  Erin [name] ZSW:[id number] DOB: 11/09/1956 DOA: 09/10/2015  PCP: Colin Benton R., DO  Admit date: 09/10/2015 Discharge date: 09/14/2015  Time spent: 35 minutes  Recommendations for Outpatient Follow-up:  1. Discharge home with home health  Discharge Diagnoses:  Principal Problem:   Closed right hip fracture ([place])   Active Problems:    Neurodermatitis   Hyperlipemia   Depression and Anxiety   Type 2 diabetes mellitus without complication (HCC)   Hypokalemia   Hypothyroidism   GERD without esophagitis   Leukocytosis   Malnutrition of moderate degree   B12 deficiency anemia   Fever, unspecified   Discharge Condition: fair  Diet recommendation: diabetic  Filed Weights   09/10/15 1742  Weight: 53.479 kg (117 lb 14.4 oz)    History of present illness:  59 y.o. female with PMH significant for diabetes type 2 (diet controlled), anxiety/depression, hypothyroidism, neurodermatitis, GERD and HLD; who presented to ED after experiencing mechanical fall at home and sustained a subcapital fracture of rt femoral neck. Seen by ortho and transferred to [place] for surgery. Underwent total hip arthroplasty on 10/21.  Hospital Course:  Closed right hip fracture [facility]):  secondary to mechanical fall S/p total hip arthroplasty on 10/21. tolerated well. . Pain control with prn oxycodone and robaxin. Added bowel regimen. Full dose ASA for DVT prophylaxis. Seen by  physical therapy and is able to ambulate with help of a walker and some support.  stable to be discharged home with home health PT. She will follow up with Dr Percell Miller in  2 weeks.    Hypotension with postoperative blood loss anemia Possibly associated with blood loss anemia. Given IV normal saline bolus followed by maintenance fluid. Also received 1 unit PRBC. cortisol level normal. Blood pressure improved.   Neurodermatitis: continue neurontin  B12 Deficiency As noted outpt. Pt  reports being started on 1000ug b12 daily and has been resumed.  Hypothyroidism Recently started on synthroid as outpt.  Depression and Anxiety stable continue home regimen (paxil and pamelor)  Type 2 DM, diet controlled -A1C of 5.6   Hypokalemia repleted  Fever  tmax of 100.8. UA unremarkable. Chest x-ray on admission negative for infiltrate. Repeat chest x-ray unremarkable as well. Has low-grade fever are hemodynamically stable. Possibly associated with pain. Should follow-up as outpatient.   Moderate protein calorie malnutrition Encouraged by mouth intake  Constipation Received enema on 10/22      Code Status: full  Family Communication: husband at bedside Disposition Plan: d/c home with HHPT    Consultants:  Dr Percell Miller  Procedures:  ORIF rt hip on 10/21  Antibiotics:  perioperative    Discharge Exam: Filed Vitals:   09/14/15 0556  BP: 106/60  Pulse: 105  Temp: 100.2 F (37.9 C)  Resp: 17     general: Not in distress HEENT: Pallor present, moist oral mucosa, supple neck Chest: Clear to auscultation bilaterally CVS: Normal S1 and S2, no murmurs rub or gallop GI: Soft, nondistended, nontender Musculoskeletal : Clean dressing over right hip, no edema CNS: Alert and oriented  Discharge Instructions   Discharge Instructions    Weight bearing as tolerated    Complete by:  As directed   Laterality:  right  Extremity:  Lower          Current Discharge Medication List    START taking these medications   Details  aspirin 325 MG tablet Take 1 tablet (325 mg total) by mouth daily. Qty: 30 tablet, Refills: 0  HYDROcodone-acetaminophen (NORCO) 5-325 MG tablet Take 1-2 tablets by mouth every 6 (six) hours as needed for moderate pain. Qty: 90 tablet, Refills: 0    methocarbamol (ROBAXIN) 500 MG tablet Take 1 tablet (500 mg total) by mouth 4 (four) times daily. Qty: 60 tablet, Refills: 0    ondansetron (ZOFRAN) 4 MG tablet Take 1  tablet (4 mg total) by mouth every 8 (eight) hours as needed for nausea or vomiting. Qty: 20 tablet, Refills: 0    vitamin B-12 1000 MCG tablet Take 1 tablet (1,000 mcg total) by mouth daily. Qty: 30 tablet, Refills: 0      CONTINUE these medications which have NOT CHANGED   Details  Biotin 10 MG TABS Take 10 mg by mouth daily.    bisacodyl (DULCOLAX) 5 MG EC tablet Take 5-10 mg by mouth daily.    dicyclomine (BENTYL) 10 MG capsule Take 1 capsule (10 mg total) by mouth 3 (three) times daily before meals. Qty: 30 capsule, Refills: 3   Associated Diagnoses: Intractable vomiting with nausea, vomiting of unspecified type; Gastritis    fluticasone (FLONASE) 50 MCG/ACT nasal spray Place 1 spray into both nostrils daily.    gabapentin (NEURONTIN) 800 MG tablet Take 1 tablet (800 mg total) by mouth 3 (three) times daily. Qty: 90 tablet, Refills: 3   Associated Diagnoses: Neurodermatitis    levothyroxine (SYNTHROID, LEVOTHROID) 25 MCG tablet Take 1 tablet (25 mcg total) by mouth daily before breakfast. Qty: 30 tablet, Refills: 2    nortriptyline (PAMELOR) 25 MG capsule Takes 3 capsules daily    omeprazole (PRILOSEC) 40 MG capsule Take 1 capsule (40 mg total) by mouth daily. Qty: 30 capsule, Refills: 11    OVER THE COUNTER MEDICATION Take 1 capsule by mouth daily. Advance colon care II    oxymetazoline (AFRIN) 0.05 % nasal spray Place 1 spray into both nostrils daily as needed for congestion.    PARoxetine (PAXIL) 20 MG tablet Take 20 mg by mouth daily.       Allergies  Allergen Reactions  . Dexamethasone Itching    Itching  . Atorvastatin Nausea And Vomiting  . Claritin [Loratadine]     Hot flashes  . Sulfa Antibiotics Other (See Comments)    GI distress-pain  . Prednisone Rash   Follow-up Information    Follow up with MURPHY, TIMOTHY D, MD In 10 days.   Specialty:  Orthopedic Surgery   Contact information:   [address] [place]  [id number] [phone]       Follow up with [facility].   Why:  home health physical therapy   Contact information:   [address] [place] [address] [phone]       Follow up with Colin Benton R., DO. Schedule an appointment as soon as possible for a visit in 2 weeks.   Specialty:  Family Medicine   Contact information:   [place] [address] [phone]        The results of significant diagnostics from this hospitalization (including imaging, microbiology, ancillary and laboratory) are listed below for reference.    Significant Diagnostic Studies: Dg Chest 1 View  09/10/2015  CLINICAL DATA:  Pt fell this morning while helping a friend carry in groceries. Pt states friends dogs ran out and caused her to trip and fall. Pain to right hip. Pt unable to fully extend right leg. Right leg stuck in lateral position. Hx inva.*comment was truncated* Initial encounter.  EXAM: CHEST 1 VIEW COMPARISON:  11/28/2013 CT FINDINGS: Midline trachea. Normal heart size and mediastinal contours. No pleural effusion or pneumothorax. Diffuse peribronchial thickening. Clear lungs. IMPRESSION: 1. No acute cardiopulmonary disease. 2. Mild peribronchial thickening which may relate to chronic bronchitis or smoking. 3. No posttraumatic deformity identified. Electronically Signed   By: Abigail Miyamoto M.D.   On: 09/10/2015 12:56   Dg Pelvis Portable  09/11/2015  CLINICAL DATA:  Postop from right hip arthroplasty. EXAM: PORTABLE PELVIS 1-2 VIEWS COMPARISON:  None. FINDINGS: Bipolar right hip prosthesis seen in appropriate position. No evidence of fracture or dislocation. No other significant bone abnormality identified. IMPRESSION: Expected postoperative appearance of bipolar right hip prosthesis. Electronically Signed   By: Earle Gell M.D.   On: 09/11/2015 11:51   Dg Hip Unilat With Pelvis 2-3 Views Right  09/10/2015  CLINICAL DATA:  [place] this morning  while helping a friend carry groceries, dog ran out and caused fall, RIGHT hip pain, unable to fully extend leg, stuck in lateral position EXAM: DG HIP (WITH OR WITHOUT PELVIS) 2-3V RIGHT COMPARISON:  None FINDINGS: Osseous demineralization. Symmetric hip and SI joints. Displaced subcapital fracture RIGHT femoral neck. No dislocation. Pelvis appears intact. IMPRESSION: Displaced subcapital fracture RIGHT femoral neck. Electronically Signed   By: Lavonia Dana M.D.   On: 09/10/2015 12:57    Microbiology: Recent Results (from the past 240 hour(s))  Surgical pcr screen     Status: None   Collection Time: 09/10/15  6:53 PM  Result Value Ref Range Status   MRSA, PCR NEGATIVE NEGATIVE Final   Staphylococcus aureus NEGATIVE NEGATIVE Final    Comment:        The Xpert SA Assay (FDA approved for NASAL specimens in patients over 21 years of age), is one component of a comprehensive surveillance program.  Test performance has been validated by [facility] for patients greater than or equal to 27 year old. It is not intended to diagnose infection nor to guide or monitor treatment.   Urine culture     Status: None   Collection Time: 09/12/15 12:25 PM  Result Value Ref Range Status   Specimen Description URINE, CLEAN CATCH  Final   Special Requests NONE  Final   Culture NO GROWTH 1 DAY  Final   Report Status 09/13/2015 FINAL  Final     Labs: Basic Metabolic Panel:  Recent Labs Lab 09/10/15 1309 09/10/15 1917 09/11/15 0527  NA 138  --  141  K 3.4*  --  3.6  CL 103  --  105  CO2 26  --  28  GLUCOSE 125*  --  93  BUN 7  --  <5*  CREATININE 0.69 0.74 0.60  CALCIUM 8.8*  --  8.7*   Liver Function Tests:  Recent Labs Lab 09/10/15 1309  AST 17  ALT 12*  ALKPHOS 131*  BILITOT 0.4  PROT 7.2  ALBUMIN 4.0   No results for input(s): LIPASE, AMYLASE in the last 168 hours. No results for input(s): AMMONIA in the last 168 hours. CBC:  Recent Labs Lab 09/10/15 1309 09/10/15 1917  09/11/15 0527 09/12/15 0340 09/13/15 0436  WBC 12.2* 9.9 5.1 8.7 6.5  HGB 11.4* 11.4* 10.8* 9.2* 7.8*  HCT 34.6* 34.4* 33.5* 27.8* 24.2*  MCV 92.5 93.5 93.3 93.3 93.1  PLT 349 290 286 223 218   Cardiac Enzymes: No results for input(s): CKTOTAL, CKMB, CKMBINDEX, TROPONINI in the last 168 hours. BNP: BNP (last 3 results) No results for  input(s): BNP in the last 8760 hours.  ProBNP (last 3 results) No results for input(s): PROBNP in the last 8760 hours.  CBG:  Recent Labs Lab 09/12/15 0613 09/12/15 1158 09/12/15 1645 09/13/15 1159 09/14/15 0657  GLUCAP 103* 112* 114* 103* 109*       Signed:  Rudransh Bellanca  [facility] 09/14/2015, 9:45 AM

## 2015-09-14 NOTE — Progress Notes (Signed)
Physical Therapy Treatment Patient Details Name: Erin [name] MRN: [id number] DOB: 1956/02/25 Today's Date: 09/14/2015    History of Present Illness Erin [name] is a 59 y.o. female with PMH significant for diabetes type 2 (diet controlled), anxiety/depression, hypothyroidism, neurodermatitis, GERD and HLD; who presented to ED after experiencing mechanical fall at home; positive for subcapital neck fracture on x-ray. S/p right total hip arthroplasty.    PT Comments    Overall managing well, even despite increased pain this am; OK for dc home from PT standpoint   Follow Up Recommendations  Home health PT;Supervision for mobility/OOB     Equipment Recommendations  None recommended by PT    Recommendations for Other Services OT consult     Precautions / Restrictions Precautions Precautions: Fall Restrictions RLE Weight Bearing: Weight bearing as tolerated    Mobility  Bed Mobility Overal bed mobility: Needs Assistance Bed Mobility: Supine to Sit     Supine to sit: Supervision     General bed mobility comments: Moves pretty quickly; cues for safety and control  Transfers Overall transfer level: Needs assistance Equipment used: Rolling walker (2 wheeled) Transfers: Sit to/from Stand Sit to Stand: Supervision         General transfer comment: RW in front. Cue for hand placement; decr control with stand to sit, likely due to pain  Ambulation/Gait Ambulation/Gait assistance: Supervision Ambulation Distance (Feet): 150 Feet Assistive device: Rolling walker (2 wheeled) Gait Pattern/deviations: Step-through pattern Gait velocity: decreased   General Gait Details: Noting improving R foot clearance with swing; Cues to take it more slowly and more deliberately push down through RW to [place] RLE if she is having increased pain   Stairs            Wheelchair Mobility    Modified Rankin (Stroke Patients Only)       Balance              Standing balance-Leahy Scale: Fair                      Cognition Arousal/Alertness: Awake/alert Behavior During Therapy: WFL for tasks assessed/performed;Impulsive Overall Cognitive Status: Within Functional Limits for tasks assessed                      Exercises Total Joint Exercises Ankle Circles/Pumps:  (prioritized going to the bathroom at pt's request)    General Comments General comments (skin integrity, edema, etc.): Husband present and helpful, giving appropriate safety cues      Pertinent Vitals/Pain Pain Assessment: 0-10 Pain Score: 6  Pain Location: R hip and anterior thigh Pain Descriptors / Indicators: Aching Pain Intervention(s): Limited activity within patient's tolerance;Monitored during session;RN gave pain meds during session    Home Living                      Prior Function            PT Goals (current goals can now be found in the care plan section) Acute Rehab PT Goals Patient Stated Goal: home tiday PT Goal Formulation: With patient/family Time For Goal Achievement: 09/18/15 Potential to Achieve Goals: Good Progress towards PT goals: Progressing toward goals    Frequency  Min 5X/week    PT Plan Current plan remains appropriate    Co-evaluation             End of Session Equipment Utilized During Treatment: Gait belt Activity Tolerance: Patient tolerated treatment  well Patient left: in chair;with call bell/phone within reach;with family/visitor present     Time: 2091-9802 PT Time Calculation (min) (ACUTE ONLY): 17 min  Charges:  $Gait Training: 8-22 mins                    G Codes:      Roney Marion Hamff 09/14/2015, 11:46 AM  Roney Marion, [place] Pager [phone] Office [phone]

## 2015-09-16 ENCOUNTER — Telehealth: Payer: Self-pay | Admitting: *Deleted

## 2015-09-16 NOTE — Telephone Encounter (Signed)
Patient was discharged from the hospital 09/10/15.  Patient is complaining of a red, swollen, tongue with bumps.  Patient does not have any problems swallowing or any shortness of breath. Please advise. Patient has an appointment 09/21/15 Transitional Care Management.

## 2015-09-16 NOTE — Telephone Encounter (Signed)
Transition Care Management Follow-up Telephone Call  How have you been since you were released from the hospital? She is still in pain with nausea   Do you understand why you were in the hospital? yes   Do you understand the discharge instrcutions? yes  Items Reviewed:  Medications reviewed: yes  Allergies reviewed: yes  Dietary changes reviewed: yes  Referrals reviewed: yes   Functional Questionnaire:   Activities of Daily Living (ADLs):   She states they are independent in the following: ambulation, bathing and hygiene, feeding, continence, grooming, toileting and dressing States they require assistance with the following: patient uses a walker and with her husband's help   Any transportation issues/concerns?: no   Any patient concerns? Yes- patient states her tongue is red, swollen with bumps.  She is not having any problems swallowing.   Confirmed importance and date/time of follow-up visits scheduled: yes   Confirmed with patient if condition begins to worsen call PCP or go to the ER.  Patient was given the Call-a-Nurse line [phone]: yes Patient was discharged 09/10/15 Patient was discharged to home Patient has an appointment with Dr Maudie Mercury 09/21/15 at 1:45 pm

## 2015-09-17 LAB — VITAMIN D 1,25 DIHYDROXY
Vitamin D 1, 25 (OH)2 Total: 69 pg/mL
Vitamin D3 1, 25 (OH)2: 69 pg/mL

## 2015-09-17 NOTE — Telephone Encounter (Signed)
Please call husband and see if he can come in tomorrow at 2:30 Friday and block the 2:45

## 2015-09-17 NOTE — Telephone Encounter (Signed)
Can you help her get appt sooner if needed? I do believe we have openings today if need to bump up her hospital transition visit - 30 minutes. Thanks.

## 2015-09-17 NOTE — Telephone Encounter (Signed)
Pt has been sch

## 2015-09-18 ENCOUNTER — Ambulatory Visit (INDEPENDENT_AMBULATORY_CARE_PROVIDER_SITE_OTHER): Payer: No Typology Code available for payment source | Admitting: Family Medicine

## 2015-09-18 ENCOUNTER — Encounter: Payer: Self-pay | Admitting: Family Medicine

## 2015-09-18 ENCOUNTER — Ambulatory Visit: Payer: No Typology Code available for payment source | Admitting: Family Medicine

## 2015-09-18 VITALS — BP 100/76 | HR 98 | Temp 98.3°F | Ht 63.0 in | Wt 120.2 lb

## 2015-09-18 DIAGNOSIS — F39 Unspecified mood [affective] disorder: Secondary | ICD-10-CM | POA: Diagnosis not present

## 2015-09-18 DIAGNOSIS — B370 Candidal stomatitis: Secondary | ICD-10-CM

## 2015-09-18 DIAGNOSIS — S72142A Displaced intertrochanteric fracture of left femur, initial encounter for closed fracture: Secondary | ICD-10-CM

## 2015-09-18 DIAGNOSIS — K589 Irritable bowel syndrome without diarrhea: Secondary | ICD-10-CM

## 2015-09-18 DIAGNOSIS — E119 Type 2 diabetes mellitus without complications: Secondary | ICD-10-CM

## 2015-09-18 DIAGNOSIS — L280 Lichen simplex chronicus: Secondary | ICD-10-CM

## 2015-09-18 DIAGNOSIS — K5900 Constipation, unspecified: Secondary | ICD-10-CM

## 2015-09-18 DIAGNOSIS — E039 Hypothyroidism, unspecified: Secondary | ICD-10-CM

## 2015-09-18 DIAGNOSIS — Z96649 Presence of unspecified artificial hip joint: Secondary | ICD-10-CM

## 2015-09-18 DIAGNOSIS — Z9660 Presence of unspecified orthopedic joint implant: Secondary | ICD-10-CM

## 2015-09-18 DIAGNOSIS — D62 Acute posthemorrhagic anemia: Secondary | ICD-10-CM

## 2015-09-18 MED ORDER — NYSTATIN 100000 UNIT/ML MT SUSP: 5.0000 mL | Freq: Four times a day (QID) | OROMUCOSAL | Status: DC

## 2015-09-18 MED ORDER — PAROXETINE HCL 20 MG PO TABS: 20.0000 mg | ORAL_TABLET | Freq: Every day | ORAL | Status: DC

## 2015-09-18 NOTE — Progress Notes (Signed)
HPI:  ABBAGAYLE ZARAGOZA is a very pleasant 59 yo F with an unfortunately complicated recent PMH, here for a transition of care visit after a recent hospitalization. See phone note.  Hospitalized 10/20-20/24/2016 for a closed R hip Fracture secondary to a mechanical fall. S/p total hip arthroplasty 10/21 On prn oxycodone and robaxin for pain and bowel regimen. Has PT at home and follow up with ortho - Dr. Percell Miller on Monday Reports: doing ok, pain ok, bowels slow, last BM 4 days ago is passing gas, tongue is painful, chronic nv - husband reports not good about taking her bowel regimen or eating Denies: CP, SOB, DOE, persistent abdominal pain, fevers since discharge  Other issues in hospital and chronic issues per discharge documents:  Neurodermatitis: continue neurontin  B12 Deficiency On replacement.  Hypothyroidism On synthroid.  Depression and Anxiety stable Meds: (paxil and pamelor)  Type 2 DM, diet controlled -A1C of 5.6  Hypokalemia repleted  Fever  tmax of 100.8. UA unremarkable. Chest x-ray on admission negative for infiltrate. Repeat chest x-ray unremarkable as well. REsolved  Moderate protein calorie malnutrition Encouraged by mouth intake  Constipation Received enema on 10/22, on bowel regimen, does not like this- her GI doc told to do mirilax  ROS: See pertinent positives and negatives per HPI.  Past Medical History  Diagnosis Date  . 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 Chronic daily headache 03/29/2013    takes bc powder  . IBS (irritable bowel syndrome) 03/29/2013  . Anxiety   . Depression   . HLD (hyperlipidemia)   . Allergy   . Blood transfusion without reported diagnosis     AS A CHILD  . Tubular adenoma of colon 09/08/2003  . Chronic back pain   . Hot flashes   . Alopecia   . Hx of radiation therapy 09/09/13-10/16/13    anal canal 60.4Gy/59fx  . Cancer ([place]) 08/14/13    invasive squamous cell ca   . DM (diabetes mellitus) ([place])      diet controlled  . History of chemotherapy     hx anal cancer  . GERD (gastroesophageal reflux disease)     Past Surgical History  Procedure Laterality Date  . Ectopic pregnancy surgery    . Pilonidal cyst excision    . Colonoscopy    . Flexible sigmoidoscopy N/A 08/14/2013    Procedure: FLEXIBLE SIGMOIDOSCOPY;  Surgeon: Ladene Artist, MD;  Location: [facility];  Service: Endoscopy;  Laterality: N/A;  . Polypectomy    . Tonsillectomy    . Total hip arthroplasty  09/11/2015    Procedure: TOTAL HIP ARTHROPLASTY;  Surgeon: Renette Butters, MD;  Location: [facility];  Service: Orthopedics;;    Family History  Problem Relation Age of Onset  . Arthritis Mother   . Hyperlipidemia Mother   . Heart disease Mother   . Hypertension Mother   . Stroke Mother 91  . Irritable bowel syndrome Mother   . Thyroid disease Mother   . Heart disease Father   . Hyperlipidemia Father   . Hypertension Father   . Stroke Father 8  . Thyroid disease Father   . Prostate cancer Father   . Lung cancer Brother   . Colon cancer Neg Hx   . Rectal cancer Neg Hx   . Stomach cancer Neg Hx     Social History   Social History  . Marital Status: Married    Spouse Name: N/A  . Number of Children: 0  .  Years of Education: N/A   Occupational History  . caregiver    Social History Main Topics  . Smoking status: Former Smoker -- 0.50 packs/day    Types: Cigarettes    Quit date: 10/07/2014  . Smokeless tobacco: Never Used  . Alcohol Use: No  . Drug Use: No  . Sexual Activity:    Partners: Male   Other Topics Concern  . None   Social History Narrative   Work or School: homemaker      Home Situation: lives with her husband,[name] takes care of her elderly parents       Spiritual Beliefs: Christian      Lifestyle: no regular exercise, poor diet                 Current outpatient prescriptions:  .  aspirin 325 MG tablet, Take 1 tablet (325 mg total) by mouth daily., Disp: 30 tablet,  Rfl: 0 .  Biotin 10 MG TABS, Take 10 mg by mouth daily., Disp: , Rfl:  .  dicyclomine (BENTYL) 10 MG capsule, Take 1 capsule (10 mg total) by mouth 3 (three) times daily before meals., Disp: 30 capsule, Rfl: 3 .  fluticasone (FLONASE) 50 MCG/ACT nasal spray, Place 1 spray into both nostrils daily., Disp: , Rfl:  .  gabapentin (NEURONTIN) 800 MG tablet, Take 1 tablet (800 mg total) by mouth 3 (three) times daily., Disp: 90 tablet, Rfl: 3 .  HYDROcodone-acetaminophen (NORCO) 5-325 MG tablet, Take 1-2 tablets by mouth every 6 (six) hours as needed for moderate pain., Disp: 90 tablet, Rfl: 0 .  levothyroxine (SYNTHROID, LEVOTHROID) 25 MCG tablet, Take 1 tablet (25 mcg total) by mouth daily before breakfast., Disp: 30 tablet, Rfl: 2 .  methocarbamol (ROBAXIN) 500 MG tablet, Take 1 tablet (500 mg total) by mouth 4 (four) times daily., Disp: 60 tablet, Rfl: 0 .  nortriptyline (PAMELOR) 25 MG capsule, Takes 3 capsules daily, Disp: , Rfl:  .  omeprazole (PRILOSEC) 40 MG capsule, Take 1 capsule (40 mg total) by mouth daily., Disp: 30 capsule, Rfl: 11 .  ondansetron (ZOFRAN) 4 MG tablet, Take 1 tablet (4 mg total) by mouth every 8 (eight) hours as needed for nausea or vomiting., Disp: 20 tablet, Rfl: 0 .  OVER THE COUNTER MEDICATION, Take 1 capsule by mouth daily. [facility] II, Disp: , Rfl:  .  oxymetazoline (AFRIN) 0.05 % nasal spray, Place 1 spray into both nostrils daily as needed for congestion., Disp: , Rfl:  .  PARoxetine (PAXIL) 20 MG tablet, Take 1 tablet (20 mg total) by mouth daily., Disp: 90 tablet, Rfl: 1 .  vitamin B-12 1000 MCG tablet, Take 1 tablet (1,000 mcg total) by mouth daily., Disp: 30 tablet, Rfl: 0 .  nystatin (MYCOSTATIN) 100000 UNIT/ML suspension, Take 5 mLs (500,000 Units total) by mouth 4 (four) times daily., Disp: 1180 mL, Rfl: 0  EXAM:  Filed Vitals:   09/18/15 1439  BP: 100/76  Pulse: 98  Temp: 98.3 F (36.8 C)    Body mass index is 21.3 kg/(m^2).  GENERAL:  vitals reviewed and listed above, alert, oriented, appears well hydrated and in no acute distress  HEENT: atraumatic, conjunttiva clear, no obvious abnormalities on inspection of external nose and ears  NECK: no obvious masses on inspection  LUNGS: clear to auscultation bilaterally, no wheezes, rales or rhonchi, good air movement  CV: HRRR, no peripheral edema  ABD: BS+, soft, mild TTP  MS: moves all extremities without noticeable abnormality  PSYCH:  pleasant and cooperative, no obvious depression or anxiety  ASSESSMENT AND PLAN:  Discussed the following assessment and plan:  Intertrochanteric fracture of left hip, closed, initial encounter ([place])  Thrush  Hypothyroidism, unspecified hypothyroidism type - Plan: TSH  Depression and Anxiety  IBS (irritable bowel syndrome)  Neurodermatitis  Type 2 diabetes mellitus without complication, without long-term current use of insulin ([facility]) - Plan: Basic metabolic panel  S/P total hip arthroplasty  Postoperative anemia due to acute blood loss - Plan: CBC (no diff)  Constipation, unspecified constipation type  -she is not eating well and encouraged adding ensure clear twice daily and small meals throughout the day -switch to mirilax for bowels, twice daily now for three day and then once successful once daily while on pain medications - return and emergecny precautions discussed -nystatin swish for thrush -labs in 2 weeks - orders placed -Patient advised to return or notify a doctor immediately if symptoms worsen or persist or new concerns arise.  Patient Instructions  BEFORE YOU LEAVE: -lab check in 2 weeks -schedule follow up in 1 month  Mirilax mixed in liquid of choice TWICE daily until BM daily then once daily. If significant abd pain, not improving or worsening please consult your gastroenterologist.  Ensure clear twice daily and small meals throughout the day  Use the Nystatin swish for the mouth as  instructed.     Colin Benton R.

## 2015-09-18 NOTE — Progress Notes (Signed)
Pre visit review using our clinic review tool, if applicable. No additional management support is needed unless otherwise documented below in the visit note. 

## 2015-09-18 NOTE — Patient Instructions (Addendum)
BEFORE YOU LEAVE: -lab check in 2 weeks -schedule follow up in 1 month  Mirilax mixed in liquid of choice TWICE daily until BM daily then once daily. If significant abd pain, not improving or worsening please consult your gastroenterologist.  Ensure clear twice daily and small meals throughout the day  Use the Nystatin swish for the mouth as instructed.

## 2015-09-21 ENCOUNTER — Ambulatory Visit: Payer: No Typology Code available for payment source | Admitting: Family Medicine

## 2015-09-22 ENCOUNTER — Telehealth: Payer: Self-pay | Admitting: Family Medicine

## 2015-09-22 NOTE — Telephone Encounter (Signed)
Ok

## 2015-09-22 NOTE — Telephone Encounter (Signed)
Erin [name] from [facility] call to request  verbal order for  Vestibular evaluation   Can call Simona Huh back at the following number  [phone]

## 2015-09-24 NOTE — Telephone Encounter (Signed)
I left a detailed message with the verbal order at [facility]' cell number.

## 2015-09-28 ENCOUNTER — Ambulatory Visit (HOSPITAL_BASED_OUTPATIENT_CLINIC_OR_DEPARTMENT_OTHER): Payer: No Typology Code available for payment source | Admitting: Nurse Practitioner

## 2015-09-28 ENCOUNTER — Telehealth: Payer: Self-pay | Admitting: Oncology

## 2015-09-28 ENCOUNTER — Encounter: Payer: Self-pay | Admitting: Nurse Practitioner

## 2015-09-28 VITALS — BP 103/68 | HR 104 | Temp 97.5°F | Resp 17 | Ht 63.0 in | Wt 114.9 lb

## 2015-09-28 DIAGNOSIS — Z87891 Personal history of nicotine dependence: Secondary | ICD-10-CM

## 2015-09-28 DIAGNOSIS — C210 Malignant neoplasm of anus, unspecified: Secondary | ICD-10-CM

## 2015-09-28 DIAGNOSIS — E538 Deficiency of other specified B group vitamins: Secondary | ICD-10-CM | POA: Diagnosis not present

## 2015-09-28 DIAGNOSIS — E119 Type 2 diabetes mellitus without complications: Secondary | ICD-10-CM | POA: Diagnosis not present

## 2015-09-28 DIAGNOSIS — Z85048 Personal history of other malignant neoplasm of rectum, rectosigmoid junction, and anus: Secondary | ICD-10-CM | POA: Diagnosis not present

## 2015-09-28 NOTE — Progress Notes (Signed)
  [facility] OFFICE PROGRESS NOTE   Diagnosis:   Anal cancer  INTERVAL HISTORY:    Erin [name] returns as scheduled. No change in baseline constipation. No blood with bowel movements. She reports a fall in October resulting in a right hip fracture. She underwent surgery.  Objective:  Vital signs in last 24 hours:  Blood pressure 103/68, pulse 104, temperature 97.5 F (36.4 C), temperature source Oral, resp. rate 17, height 5\' 3"  (1.6 m), weight 114 lb 14.4 oz (52.118 kg), SpO2 98 %.    HEENT:  No thrush or ulcers. Lymphatics:  No palpable cervical, supra clavicular, axillary or inguinal lymph nodes. Resp:  Lungs clear bilaterally. Cardio:  Regular rate and rhythm. GI:  Abdomen soft. No hepatomegaly. No mass. Vascular:  No leg edema. Rectal:  Small external hemorrhoid. No rectal mass. Skin:  Right hip incision with Steri-Strips intact.    Lab Results:  Lab Results  Component Value Date   WBC 6.5 09/13/2015   HGB 7.8* 09/13/2015   HCT 24.2* 09/13/2015   MCV 93.1 09/13/2015   PLT 218 09/13/2015   NEUTROABS 3.2 06/27/2015    Imaging:  No results found.  Medications: I have reviewed the patient's current medications.  Assessment/Plan: 1. Squamous cell carcinoma of the anus, clinical stage II (T2 N0), status post endoscopic biopsy 08/14/2013 confirming invasive squamous cell carcinoma. Initiation of radiation and cycle 1 5-FU/mitomycin C. 09/09/2013. Cycle 2 5-FU/mitomycin-C (dose reduction) completed beginning 10/08/2013, radiation completed 10/16/2013. 2. Rectal pain, constipation and bleeding secondary to #1. Resolved. 3. History of Iron deficiency anemia. 4. Vitamin B12 deficiency. 5. Diabetes. 6. Hyperlipidemia. 7. Neurodermatitis. 8. Tobacco use. She reports she has quit smoking. 9. Mucositis following cycle 1 and cycle 2 5-FU/mitomycin C.  10. Skin rash. Initially felt to be related to the chemotherapy though somewhat atypical in appearance and  distribution. The rash has resolved. 11. 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  12. History of hypokalemia. 13. Status post sigmoidoscopy 03/10/2014 with findings of distal rectal/anal canal nodularity measuring 2 cm x 2 cm. Biopsy showed benign ulcerated anorectal junction mucosa and inflammatory debris. Within the debris there were markedly atypical epithelial cell fragments and single cells. 14. Status post sigmoidoscopy 09/23/2014. Area of abnormal mucosa in the distal rectum/anal canal-friable, erythematous, scarring; multiple biopsies performed. Pathology revealed no dysplasia or malignancy. 15. Bilateral hip pain. Status post CT abdomen/pelvis 08/11/2014 with no etiology for the pain identified. Resolved after physical therapy. 16. Right hip fracture following a fall 09/10/2015 status post total hip arthroplasty 09/11/2015   Disposition: Erin [name] remains in clinical remission from anal cancer. She will return for a follow-up visit in 8 months. We made a referral to Dr. Fuller Plan for a sigmoidoscopy.  Plan reviewed with Dr. Benay Spice.  [name] ANP/GNP-BC   09/28/2015  1:31 PM

## 2015-09-28 NOTE — Telephone Encounter (Signed)
Gave and printed appt sched adn avs fo rpt for July 2017 °

## 2015-10-01 ENCOUNTER — Ambulatory Visit: Payer: No Typology Code available for payment source | Admitting: Family Medicine

## 2015-10-01 ENCOUNTER — Other Ambulatory Visit: Payer: Self-pay | Admitting: Neurology

## 2015-10-02 ENCOUNTER — Ambulatory Visit (AMBULATORY_SURGERY_CENTER): Payer: Self-pay

## 2015-10-02 VITALS — Ht 63.0 in | Wt 112.8 lb

## 2015-10-02 DIAGNOSIS — C189 Malignant neoplasm of colon, unspecified: Secondary | ICD-10-CM

## 2015-10-02 NOTE — Progress Notes (Signed)
No allergies to eggs or soy No past problems with anesthesia No diet/weight loss meds No home oxygen  Refused emmi; has internet and email

## 2015-10-05 ENCOUNTER — Ambulatory Visit: Payer: No Typology Code available for payment source | Admitting: Family Medicine

## 2015-10-08 ENCOUNTER — Other Ambulatory Visit: Payer: Self-pay

## 2015-10-08 DIAGNOSIS — Z1231 Encounter for screening mammogram for malignant neoplasm of breast: Secondary | ICD-10-CM

## 2015-10-09 ENCOUNTER — Other Ambulatory Visit (INDEPENDENT_AMBULATORY_CARE_PROVIDER_SITE_OTHER): Payer: No Typology Code available for payment source

## 2015-10-09 DIAGNOSIS — E119 Type 2 diabetes mellitus without complications: Secondary | ICD-10-CM

## 2015-10-09 DIAGNOSIS — E038 Other specified hypothyroidism: Secondary | ICD-10-CM | POA: Diagnosis not present

## 2015-10-09 DIAGNOSIS — D62 Acute posthemorrhagic anemia: Secondary | ICD-10-CM

## 2015-10-09 DIAGNOSIS — E039 Hypothyroidism, unspecified: Secondary | ICD-10-CM

## 2015-10-09 LAB — CBC
HCT: 37.4 % (ref 36.0–46.0)
Hemoglobin: 12.2 g/dL (ref 12.0–15.0)
MCHC: 32.7 g/dL (ref 30.0–36.0)
MCV: 92.1 fl (ref 78.0–100.0)
PLATELETS: 439 10*3/uL — AB (ref 150.0–400.0)
RBC: 4.06 Mil/uL (ref 3.87–5.11)
RDW: 15.2 % (ref 11.5–15.5)
WBC: 5.1 10*3/uL (ref 4.0–10.5)

## 2015-10-09 LAB — TSH: TSH: 9.46 u[IU]/mL — AB (ref 0.35–4.50)

## 2015-10-09 LAB — BASIC METABOLIC PANEL
BUN: 11 mg/dL (ref 6–23)
CHLORIDE: 101 meq/L (ref 96–112)
CO2: 26 mEq/L (ref 19–32)
Calcium: 9.4 mg/dL (ref 8.4–10.5)
Creatinine, Ser: 0.9 mg/dL (ref 0.40–1.20)
GFR: 68.09 mL/min (ref >60.00)
Glucose, Bld: 119 mg/dL — ABNORMAL HIGH (ref 70–99)
POTASSIUM: 4.6 meq/L (ref 3.5–5.1)
SODIUM: 138 meq/L (ref 135–145)

## 2015-10-12 ENCOUNTER — Other Ambulatory Visit: Payer: Self-pay | Admitting: *Deleted

## 2015-10-12 MED ORDER — LEVOTHYROXINE SODIUM 50 MCG PO TABS: 50.0000 ug | ORAL_TABLET | Freq: Every day | ORAL | Status: DC

## 2015-10-19 ENCOUNTER — Ambulatory Visit: Payer: No Typology Code available for payment source | Admitting: Family Medicine

## 2015-10-21 ENCOUNTER — Encounter: Payer: Self-pay | Admitting: Gastroenterology

## 2015-10-21 ENCOUNTER — Ambulatory Visit (AMBULATORY_SURGERY_CENTER): Payer: No Typology Code available for payment source | Admitting: Gastroenterology

## 2015-10-21 VITALS — BP 155/94 | HR 78 | Temp 98.9°F | Resp 14 | Ht 63.0 in | Wt 112.0 lb

## 2015-10-21 DIAGNOSIS — Z85048 Personal history of other malignant neoplasm of rectum, rectosigmoid junction, and anus: Secondary | ICD-10-CM

## 2015-10-21 DIAGNOSIS — K2970 Gastritis, unspecified, without bleeding: Secondary | ICD-10-CM

## 2015-10-21 DIAGNOSIS — R1110 Vomiting, unspecified: Secondary | ICD-10-CM

## 2015-10-21 MED ORDER — SODIUM CHLORIDE 0.9 % IV SOLN: 500.0000 mL | INTRAVENOUS | Status: DC

## 2015-10-21 MED ORDER — DICYCLOMINE HCL 10 MG PO CAPS: 10.0000 mg | ORAL_CAPSULE | Freq: Three times a day (TID) | ORAL | Status: DC

## 2015-10-21 NOTE — Progress Notes (Signed)
To recovery, report to Brown, RN, VSS. 

## 2015-10-21 NOTE — Patient Instructions (Signed)
YOU HAD AN ENDOSCOPIC PROCEDURE TODAY AT THE [facility]:   Refer to the procedure report that was given to you for any specific questions about what was found during the examination.  If the procedure report does not answer your questions, please call your gastroenterologist to clarify.  If you requested that your care partner not be given the details of your procedure findings, then the procedure report has been included in a sealed envelope for you to review at your convenience later.  YOU SHOULD EXPECT: Some feelings of bloating in the abdomen. Passage of more gas than usual.  Walking can help get rid of the air that was put into your GI tract during the procedure and reduce the bloating. If you had a lower endoscopy (such as a colonoscopy or flexible sigmoidoscopy) you may notice spotting of blood in your stool or on the toilet paper. If you underwent a bowel prep for your procedure, you may not have a normal bowel movement for a few days.  Please Note:  You might notice some irritation and congestion in your nose or some drainage.  This is from the oxygen used during your procedure.  There is no need for concern and it should clear up in a day or so.  SYMPTOMS TO REPORT IMMEDIATELY:   Following lower endoscopy (colonoscopy or flexible sigmoidoscopy):  Excessive amounts of blood in the stool  Significant tenderness or worsening of abdominal pains  Swelling of the abdomen that is new, acute  Fever of 100F or higher   For urgent or emergent issues, a gastroenterologist can be reached at any hour by calling [phone].   DIET: Your first meal following the procedure should be a small meal and then it is ok to progress to your normal diet. Heavy or fried foods are harder to digest and may make you feel nauseous or bloated.  Likewise, meals heavy in dairy and vegetables can increase bloating.  Drink plenty of fluids but you should avoid alcoholic beverages for 24  hours.  ACTIVITY:  You should plan to take it easy for the rest of today and you should NOT DRIVE or use heavy machinery until tomorrow (because of the sedation medicines used during the test).    FOLLOW UP: Our staff will call the number listed on your records the next business day following your procedure to check on you and address any questions or concerns that you may have regarding the information given to you following your procedure. If we do not reach you, we will leave a message.  However, if you are feeling well and you are not experiencing any problems, there is no need to return our call.  We will assume that you have returned to your regular daily activities without incident.  If any biopsies were taken you will be contacted by phone or by letter within the next 1-3 weeks.  Please call us at [phone] if you have not heard about the biopsies in 3 weeks.    SIGNATURES/CONFIDENTIALITY: You and/or your care partner have signed paperwork which will be entered into your electronic medical record.  These signatures attest to the fact that that the information above on your After Visit Summary has been reviewed and is understood.  Full responsibility of the confidentiality of this discharge information lies with you and/or your care-partner.   Resume medications. 

## 2015-10-21 NOTE — Op Note (Signed)
[facility]  [address]. [place], [address]   FLEXIBLE SIGMOIDOSCOPY PROCEDURE REPORT  PATIENT: Erin [name], Erin [name]  MR#: [id number] BIRTHDATE: 1956/06/23 , 7  yrs. old GENDER: female ENDOSCOPIST: Ladene Artist, MD, [name] REFERRED BY: Arturo Morton, M.D. PROCEDURE DATE:  10/21/2015 PROCEDURE:   Sigmoidoscopy, surveillance ASA CLASS:   Class II INDICATIONS:high risk personal history of anal/rectal cancer. MEDICATIONS: Monitored anesthesia care and Propofol 70 mg IV DESCRIPTION OF PROCEDURE:   After the risks benefits and alternatives of the procedure were thoroughly explained, informed consent was obtained.  Digital exam revealed no abnormalities of the rectum except smal external hemorrhoidal tags. The [id number]  endoscope was introduced through the anus  and advanced to the descending colon , The exam was Without limitations.    The quality of the prep was The overall prep quality was good. . Estimated blood loss is zero unless otherwise noted in this procedure report. The instrument was then slowly withdrawn as the mucosa was fully examined.    COLON FINDINGS: A small area of abnormal mucosa was found in the distal rectum. The mucosa adjacent to the dentate line was granular and erythematous-it appeared less abnormal compared to last flex sig.  Several scattered small mucosal abrasions in the distal to mid rectum likely related to enemas.  The colonic mucosa appeared normal in the descending colon and sigmoid colon.    Retroflexion was not performed due to a narrow rectal vault. The scope was then withdrawn from the patient and the procedure terminated.  COMPLICATIONS: There were no immediate complications.  ENDOSCOPIC IMPRESSION: 1.   Abnormal mucosa in the distal rectum 2.   Several small abrasions in rectum 3.   The mucosa appeared normal in the descending colon and sigmoid colon  RECOMMENDATIONS: 1.  Colonoscopy in 07/2018 for history  of anal cancer and history of adenomatous colon polyps   eSigned:  Ladene Artist, MD, [facility] 10/21/2015 2:00 PM

## 2015-10-22 ENCOUNTER — Telehealth: Payer: Self-pay

## 2015-10-22 NOTE — Telephone Encounter (Signed)
  Follow up Call-  Call back number 10/21/2015 07/20/2015 09/23/2014 03/10/2014 08/05/2013  Post procedure Call Back phone  # [phone]  Permission to leave phone message Yes Yes Yes Yes Yes     Patient questions:  Do you have a fever, pain , or abdominal swelling? No. Pain Score  0 *  Have you tolerated food without any problems? Yes.    Have you been able to return to your normal activities? Yes.    Do you have any questions about your discharge instructions: Diet   No. Medications  No. Follow up visit  No.  Do you have questions or concerns about your Care? No.  Actions: * If pain score is 4 or above: No action needed, pain <4.

## 2015-10-29 ENCOUNTER — Other Ambulatory Visit: Payer: Self-pay

## 2015-10-29 ENCOUNTER — Other Ambulatory Visit: Payer: Self-pay | Admitting: Family Medicine

## 2015-10-29 DIAGNOSIS — R1110 Vomiting, unspecified: Secondary | ICD-10-CM

## 2015-10-29 DIAGNOSIS — K2970 Gastritis, unspecified, without bleeding: Secondary | ICD-10-CM

## 2015-10-29 MED ORDER — DICYCLOMINE HCL 10 MG PO CAPS: 10.0000 mg | ORAL_CAPSULE | Freq: Three times a day (TID) | ORAL | Status: DC

## 2015-10-29 MED ORDER — OMEPRAZOLE 40 MG PO CPDR: 40.0000 mg | DELAYED_RELEASE_CAPSULE | Freq: Every day | ORAL | Status: DC

## 2015-10-29 NOTE — Telephone Encounter (Signed)
Med is meant to be taken tid. Ok to do 600mg  tid if she wishes. Thanks.

## 2015-10-29 NOTE — Telephone Encounter (Signed)
Ok to do 600mg  tid.

## 2015-10-29 NOTE — Telephone Encounter (Signed)
Ms. Fronheiser called in regards to the Gabapentin Rx. She'd like Dr. Maudie Mercury to write a Rx for 600mg  pills instead of 800mg  She'd prefer to take 600mg  pills four times a day because they're easier to swallow. She wanted to call before the [facility] Rx was sent. Please give her a call if you have questions or concerns.  Pt ph# [phone] Thank you.

## 2015-10-30 ENCOUNTER — Other Ambulatory Visit: Payer: Self-pay | Admitting: *Deleted

## 2015-10-30 ENCOUNTER — Telehealth: Payer: Self-pay | Admitting: *Deleted

## 2015-10-30 MED ORDER — GABAPENTIN 600 MG PO TABS: 600.0000 mg | ORAL_TABLET | Freq: Three times a day (TID) | ORAL | Status: DC

## 2015-10-30 MED ORDER — LEVOTHYROXINE SODIUM 50 MCG PO TABS: 50.0000 ug | ORAL_TABLET | Freq: Every day | ORAL | Status: DC

## 2015-10-30 NOTE — Telephone Encounter (Signed)
[facility] faxed a refill request for Promethazine 12.5mg .

## 2015-10-30 NOTE — Telephone Encounter (Signed)
I called the pt and left a detailed message stating Dr Maudie Mercury approved the dose change from 800mg  to take 600mg  three times a day and this was sent to [facility].

## 2015-11-02 ENCOUNTER — Ambulatory Visit: Payer: No Typology Code available for payment source | Admitting: Family Medicine

## 2015-11-02 MED ORDER — FLUTICASONE PROPIONATE 50 MCG/ACT NA SUSP: 1.0000 | Freq: Every day | NASAL | Status: DC

## 2015-11-02 NOTE — Telephone Encounter (Signed)
Patient informed of the message below and states she will call Dr Lynne Leader office.

## 2015-11-02 NOTE — Telephone Encounter (Signed)
Advise refills of this with her gastroenterologist if possible. Thanks.

## 2015-11-09 ENCOUNTER — Telehealth: Payer: Self-pay | Admitting: Family Medicine

## 2015-11-09 ENCOUNTER — Encounter: Payer: No Typology Code available for payment source | Admitting: Family Medicine

## 2015-11-09 DIAGNOSIS — F39 Unspecified mood [affective] disorder: Secondary | ICD-10-CM | POA: Insufficient documentation

## 2015-11-09 NOTE — Progress Notes (Signed)
HPI:  Depression and anxiety -meds: paxil, (and pamelor) -stable  Type 2 DM: -diet controlled  Hypothyroidism: -meds: increased synthroid to 13mcg last check  B 12 def: -normal range last check -taking oral B12  Constipation/Chronic nausea and vomiting/GERD: -seeing GI  Neurodermatitis/Chronic daily headaches: -sees neurologist -takes: gabapentin, nortriptyline  Anal Ca: -dx 07/2013 -s/p radiation and chemo -sees Dr. Radford Pax in oncollogy  S/p R hip fx, 08/2015 -s/p total hip arthroplasty  ROS: See pertinent positives and negatives per HPI.  Past Medical History  Diagnosis Date  . 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 Chronic daily headache 03/29/2013    takes bc powder  . IBS (irritable bowel syndrome) 03/29/2013  . Anxiety   . Depression   . HLD (hyperlipidemia)   . Allergy   . Blood transfusion without reported diagnosis     AS A CHILD  . Tubular adenoma of colon 09/08/2003  . Chronic back pain   . Hot flashes   . Alopecia   . Hx of radiation therapy 09/09/13-10/16/13    anal canal 60.4Gy/97fx  . Cancer ([place]) 08/14/13    invasive squamous cell ca   . DM (diabetes mellitus) ([place])     diet controlled  . History of chemotherapy     hx anal cancer  . GERD (gastroesophageal reflux disease)   . Vertigo     Past Surgical History  Procedure Laterality Date  . Ectopic pregnancy surgery    . Pilonidal cyst excision    . Colonoscopy    . Flexible sigmoidoscopy N/A 08/14/2013    Procedure: FLEXIBLE SIGMOIDOSCOPY;  Surgeon: Ladene Artist, MD;  [facility];  Service: Endoscopy;  Laterality: N/A;  . Polypectomy    . Tonsillectomy    . Total hip arthroplasty  09/11/2015    Procedure: TOTAL HIP ARTHROPLASTY;  Surgeon: Renette Butters, MD;  Location: [place];  Service: Orthopedics;;    Family History  Problem Relation Age of Onset  . Arthritis Mother   . Hyperlipidemia Mother   . Heart disease Mother   . Hypertension Mother   . Stroke  Mother 54  . Irritable bowel syndrome Mother   . Thyroid disease Mother   . Heart disease Father   . Hyperlipidemia Father   . Hypertension Father   . Stroke Father 45  . Thyroid disease Father   . Prostate cancer Father   . Lung cancer Brother   . Colon cancer Neg Hx   . Rectal cancer Neg Hx   . Stomach cancer Neg Hx     Social History   Social History  . Marital Status: Married    Spouse Name: N/A  . Number of Children: 0  . Years of Education: N/A   Occupational History  . caregiver    Social History Main Topics  . Smoking status: Former Smoker -- 0.50 packs/day    Types: Cigarettes    Quit date: 10/07/2014  . Smokeless tobacco: Never Used  . Alcohol Use: No  . Drug Use: No  . Sexual Activity:    Partners: Male   Other Topics Concern  . Not on file   Social History Narrative   Work or School: homemaker      Home Situation: lives with her husband,[name] takes care of her elderly parents       Spiritual Beliefs: Christian      Lifestyle: no regular exercise, poor diet  Current outpatient prescriptions:  .  aspirin 325 MG tablet, Take 1 tablet (325 mg total) by mouth daily., Disp: 30 tablet, Rfl: 0 .  Biotin 10 MG TABS, Take 10 mg by mouth daily., Disp: , Rfl:  .  dicyclomine (BENTYL) 10 MG capsule, Take 1 capsule (10 mg total) by mouth 3 (three) times daily before meals., Disp: 270 capsule, Rfl: 3 .  fluticasone (FLONASE) 50 MCG/ACT nasal spray, Place 1 spray into both nostrils daily., Disp: 16 g, Rfl: 3 .  gabapentin (NEURONTIN) 600 MG tablet, Take 1 tablet (600 mg total) by mouth 3 (three) times daily., Disp: 270 tablet, Rfl: 3 .  gabapentin (NEURONTIN) 800 MG tablet, Take 1 tablet (800 mg total) by mouth 3 (three) times daily., Disp: 90 tablet, Rfl: 3 .  HYDROcodone-acetaminophen (NORCO) 5-325 MG tablet, Take 1-2 tablets by mouth every 6 (six) hours as needed for moderate pain., Disp: 90 tablet, Rfl: 0 .  levothyroxine (SYNTHROID,  LEVOTHROID) 50 MCG tablet, Take 1 tablet (50 mcg total) by mouth daily., Disp: 90 tablet, Rfl: 1 .  methocarbamol (ROBAXIN) 500 MG tablet, Take 1 tablet (500 mg total) by mouth 4 (four) times daily., Disp: 60 tablet, Rfl: 0 .  nortriptyline (PAMELOR) 25 MG capsule, Takes 3 capsules daily, Disp: , Rfl:  .  omeprazole (PRILOSEC) 40 MG capsule, Take 1 capsule (40 mg total) by mouth daily., Disp: 90 capsule, Rfl: 3 .  ondansetron (ZOFRAN) 4 MG tablet, Take 1 tablet (4 mg total) by mouth every 8 (eight) hours as needed for nausea or vomiting., Disp: 20 tablet, Rfl: 0 .  OVER THE COUNTER MEDICATION, Take 1 capsule by mouth daily. Advance colon care II, Disp: , Rfl:  .  oxymetazoline (AFRIN) 0.05 % nasal spray, Place 1 spray into both nostrils daily as needed for congestion., Disp: , Rfl:  .  PARoxetine (PAXIL) 20 MG tablet, Take 1 tablet (20 mg total) by mouth daily., Disp: 90 tablet, Rfl: 1 .  vitamin B-12 1000 MCG tablet, Take 1 tablet (1,000 mcg total) by mouth daily., Disp: 30 tablet, Rfl: 0  EXAM:  There were no vitals filed for this visit.  There is no weight on file to calculate BMI.  GENERAL: vitals reviewed and listed above, alert, oriented, appears well hydrated and in no acute distress  HEENT: atraumatic, conjunttiva clear, no obvious abnormalities on inspection of external nose and ears  NECK: no obvious masses on inspection  LUNGS: clear to auscultation bilaterally, no wheezes, rales or rhonchi, good air movement  CV: HRRR, no peripheral edema  MS: moves all extremities without noticeable abnormality  PSYCH: pleasant and cooperative, no obvious depression or anxiety  ASSESSMENT AND PLAN:  Discussed the following assessment and plan:  No diagnosis found.  -Patient advised to return or notify a doctor immediately if symptoms worsen or persist or new concerns arise.  There are no Patient Instructions on file for this visit.   Colin Benton R.   This encounter was created  in error - please disregard.

## 2015-11-09 NOTE — Telephone Encounter (Signed)
Please help her reschedule and do not charge. Thank you.

## 2015-11-09 NOTE — Telephone Encounter (Signed)
lmom for pt to call back

## 2015-11-09 NOTE — Telephone Encounter (Signed)
Pt has a 59 yr old dog who is having sob and pt does not want to leave dog alone to come to her appointment. Pt is aware can be charge 50.00 fee

## 2015-11-11 NOTE — Telephone Encounter (Signed)
lmom for pt to call back

## 2015-11-12 NOTE — Telephone Encounter (Signed)
Pt has been sch

## 2015-11-13 ENCOUNTER — Ambulatory Visit: Payer: No Typology Code available for payment source

## 2015-11-13 ENCOUNTER — Ambulatory Visit: Payer: No Typology Code available for payment source | Admitting: Neurology

## 2015-11-17 ENCOUNTER — Telehealth: Payer: Self-pay | Admitting: Family Medicine

## 2015-11-17 NOTE — Telephone Encounter (Signed)
Have not prescribed. Would advise she contact her gastroenterologist for nausea medications so that they are aware of her symptoms. Thanks.

## 2015-11-17 NOTE — Telephone Encounter (Signed)
Pt requesting PROMETHAZINE HCL 12.5MG   I dont see that medication in pt medication list  Please advise

## 2015-11-20 ENCOUNTER — Encounter: Payer: Self-pay | Admitting: Family Medicine

## 2015-11-20 ENCOUNTER — Ambulatory Visit (INDEPENDENT_AMBULATORY_CARE_PROVIDER_SITE_OTHER): Payer: No Typology Code available for payment source | Admitting: Family Medicine

## 2015-11-20 VITALS — BP 102/78 | HR 100 | Temp 97.8°F | Ht 63.0 in | Wt 115.8 lb

## 2015-11-20 DIAGNOSIS — E119 Type 2 diabetes mellitus without complications: Secondary | ICD-10-CM

## 2015-11-20 DIAGNOSIS — E039 Hypothyroidism, unspecified: Secondary | ICD-10-CM

## 2015-11-20 DIAGNOSIS — D473 Essential (hemorrhagic) thrombocythemia: Secondary | ICD-10-CM

## 2015-11-20 DIAGNOSIS — R7989 Other specified abnormal findings of blood chemistry: Secondary | ICD-10-CM

## 2015-11-20 LAB — CBC WITH DIFFERENTIAL/PLATELET
Basophils Absolute: 0 10*3/uL (ref 0.0–0.1)
Basophils Relative: 0.3 % (ref 0.0–3.0)
EOS PCT: 4.2 % (ref 0.0–5.0)
Eosinophils Absolute: 0.3 10*3/uL (ref 0.0–0.7)
HEMATOCRIT: 37.2 % (ref 36.0–46.0)
Hemoglobin: 12.3 g/dL (ref 12.0–15.0)
LYMPHS ABS: 0.8 10*3/uL (ref 0.7–4.0)
LYMPHS PCT: 12.1 % (ref 12.0–46.0)
MCHC: 33 g/dL (ref 30.0–36.0)
MCV: 92.6 fl (ref 78.0–100.0)
MONOS PCT: 11.8 % (ref 3.0–12.0)
Monocytes Absolute: 0.8 10*3/uL (ref 0.1–1.0)
NEUTROS ABS: 5 10*3/uL (ref 1.4–7.7)
NEUTROS PCT: 71.6 % (ref 43.0–77.0)
PLATELETS: 433 10*3/uL — AB (ref 150.0–400.0)
RBC: 4.02 Mil/uL (ref 3.87–5.11)
RDW: 14.8 % (ref 11.5–15.5)
WBC: 7 10*3/uL (ref 4.0–10.5)

## 2015-11-20 LAB — TSH: TSH: 2.53 u[IU]/mL (ref 0.35–4.50)

## 2015-11-20 NOTE — Progress Notes (Signed)
Pre visit review using our clinic review tool, if applicable. No additional management support is needed unless otherwise documented below in the visit note. 

## 2015-11-20 NOTE — Progress Notes (Signed)
HPI:  AL NOURY is a pleasant 59 yo with and unfortunately complicated PMH here for follow up:  Hypothyroidism/Mild diet controlled DM: -increased synthroid to 61mcg 09/2015 -reports: feeling good  GERD/Chronic nausea and vomiting/IBS: -sees GI -meds: bentyl,prilosec 40mg , zofran  Depression/anxiety/Neurodermatitis/vertigo/chronic daily headahces: -sees neurology -meds: paxil 20mg , gabapentin 600 tid, nortriptyline -stable  B12 def: -on replacement -last b12 in 08/2015 normal -stable  Hx Anal Ca: -managed by oncology and GI -doing well  Hx hip fx: -sees Dr. Percell Miller -reports much improved  ROS: See pertinent positives and negatives per HPI.  Past Medical History  Diagnosis Date  . 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 Chronic daily headache 03/29/2013    takes bc powder  . IBS (irritable bowel syndrome) 03/29/2013  . Anxiety   . Depression   . HLD (hyperlipidemia)   . Allergy   . Blood transfusion without reported diagnosis   . Tubular adenoma of colon 09/08/2003  . Chronic back pain   . Hot flashes   . Alopecia   . Anal cancer ([place]) 08/14/13    invasive squamous cell ca, s/p radiation 10/20-11/26/14 60.4Gy/69fx and chemo  . DM (diabetes mellitus) ([place])     diet controlled  . GERD (gastroesophageal reflux disease)   . Vertigo   . Hypothyroidism 09/10/2015  . Closed right hip fracture ([place]) 09/10/2015  . Hyperlipemia 04/11/2014  . B12 deficiency anemia 09/14/2015    Past Surgical History  Procedure Laterality Date  . Ectopic pregnancy surgery    . Pilonidal cyst excision    . Colonoscopy    . Flexible sigmoidoscopy N/A 08/14/2013    Procedure: FLEXIBLE SIGMOIDOSCOPY;  Surgeon: Ladene Artist, MD;  [facility];  Service: Endoscopy;  Laterality: N/A;  . Polypectomy    . Tonsillectomy    . Total hip arthroplasty  09/11/2015    Procedure: TOTAL HIP ARTHROPLASTY;  Surgeon: Renette Butters, MD;  Location: [place];  Service:  Orthopedics;;    Family History  Problem Relation Age of Onset  . Arthritis Mother   . Hyperlipidemia Mother   . Heart disease Mother   . Hypertension Mother   . Stroke Mother 91  . Irritable bowel syndrome Mother   . Thyroid disease Mother   . Heart disease Father   . Hyperlipidemia Father   . Hypertension Father   . Stroke Father 27  . Thyroid disease Father   . Prostate cancer Father   . Lung cancer Brother   . Colon cancer Neg Hx   . Rectal cancer Neg Hx   . Stomach cancer Neg Hx     Social History   Social History  . Marital Status: Married    Spouse Name: N/A  . Number of Children: 0  . Years of Education: N/A   Occupational History  . caregiver    Social History Main Topics  . Smoking status: Former Smoker -- 0.50 packs/day    Types: Cigarettes    Quit date: 10/07/2014  . Smokeless tobacco: Never Used  . Alcohol Use: No  . Drug Use: No  . Sexual Activity:    Partners: Male   Other Topics Concern  . None   Social History Narrative   Work or School: homemaker      Home Situation: lives with her husband,[name] takes care of her elderly parents       Spiritual Beliefs: Christian      Lifestyle: no regular exercise, poor diet  Current outpatient prescriptions:  .  aspirin 325 MG tablet, Take 1 tablet (325 mg total) by mouth daily., Disp: 30 tablet, Rfl: 0 .  Biotin 10 MG TABS, Take 10 mg by mouth daily., Disp: , Rfl:  .  dicyclomine (BENTYL) 10 MG capsule, Take 1 capsule (10 mg total) by mouth 3 (three) times daily before meals., Disp: 270 capsule, Rfl: 3 .  fluticasone (FLONASE) 50 MCG/ACT nasal spray, Place 1 spray into both nostrils daily., Disp: 16 g, Rfl: 3 .  gabapentin (NEURONTIN) 600 MG tablet, Take 1 tablet (600 mg total) by mouth 3 (three) times daily., Disp: 270 tablet, Rfl: 3 .  HYDROcodone-acetaminophen (NORCO) 5-325 MG tablet, Take 1-2 tablets by mouth every 6 (six) hours as needed for moderate pain., Disp: 90  tablet, Rfl: 0 .  levothyroxine (SYNTHROID, LEVOTHROID) 50 MCG tablet, Take 1 tablet (50 mcg total) by mouth daily., Disp: 90 tablet, Rfl: 1 .  methocarbamol (ROBAXIN) 500 MG tablet, Take 1 tablet (500 mg total) by mouth 4 (four) times daily., Disp: 60 tablet, Rfl: 0 .  nortriptyline (PAMELOR) 25 MG capsule, Takes 3 capsules daily, Disp: , Rfl:  .  omeprazole (PRILOSEC) 40 MG capsule, Take 1 capsule (40 mg total) by mouth daily., Disp: 90 capsule, Rfl: 3 .  ondansetron (ZOFRAN) 4 MG tablet, Take 1 tablet (4 mg total) by mouth every 8 (eight) hours as needed for nausea or vomiting., Disp: 20 tablet, Rfl: 0 .  OVER THE COUNTER MEDICATION, Take 1 capsule by mouth daily. Advance colon care II, Disp: , Rfl:  .  oxymetazoline (AFRIN) 0.05 % nasal spray, Place 1 spray into both nostrils daily as needed for congestion., Disp: , Rfl:  .  PARoxetine (PAXIL) 20 MG tablet, Take 1 tablet (20 mg total) by mouth daily., Disp: 90 tablet, Rfl: 1 .  vitamin B-12 1000 MCG tablet, Take 1 tablet (1,000 mcg total) by mouth daily., Disp: 30 tablet, Rfl: 0  EXAM:  Filed Vitals:   11/20/15 1006  BP: 102/78  Pulse: 100  Temp: 97.8 F (36.6 C)    Body mass index is 20.52 kg/(m^2).  GENERAL: vitals reviewed and listed above, alert, oriented, appears well hydrated and in no acute distress  HEENT: atraumatic, conjunttiva clear, no obvious abnormalities on inspection of external nose and ears  NECK: no obvious masses on inspection  LUNGS: clear to auscultation bilaterally, no wheezes, rales or rhonchi, good air movement  CV: HRRR, no peripheral edema  MS: moves all extremities without noticeable abnormality  PSYCH: pleasant and cooperative, no obvious depression or anxiety  ASSESSMENT AND PLAN:  Discussed the following assessment and plan:  Hypothyroidism, unspecified hypothyroidism type - Plan: TSH  Type 2 diabetes mellitus without complication, without long-term current use of insulin (HCC)  Elevated  platelet count ([place]) - Plan: CBC with Differential  -doing well -foot exam done -labs for follow up on thyroid and platelets -Patient advised to return or notify a doctor immediately if symptoms worsen or persist or new concerns arise.  Patient Instructions  BEFORE YOU LEAVE: -labs -follow up appointment in 4 months  -We have ordered labs or studies at this visit. It can take up to 1-2 weeks for results and processing. We will contact you with instructions IF your results are abnormal. Normal results will be released to your [facility]. If you have not heard from [place] or can not find your results in [facility] in 2 weeks please contact our office.  We recommend the following healthy lifestyle  measures: - eat a healthy whole foods diet consisting of regular small meals composed of vegetables, fruits, beans, nuts, seeds, healthy meats such as white chicken and fish and whole grains.  - avoid sweets, white starchy foods, fried foods, fast food, processed foods, sodas, red meet and other fattening foods.  - get a least 150-300 minutes of aerobic exercise per week.            Colin Benton R.

## 2015-11-20 NOTE — Patient Instructions (Signed)
BEFORE YOU LEAVE: -labs -follow up appointment in 4 months  -We have ordered labs or studies at this visit. It can take up to 1-2 weeks for results and processing. We will contact you with instructions IF your results are abnormal. Normal results will be released to your [facility]. If you have not heard from [place] or can not find your results in [facility] in 2 weeks please contact our office.  We recommend the following healthy lifestyle measures: - eat a healthy whole foods diet consisting of regular small meals composed of vegetables, fruits, beans, nuts, seeds, healthy meats such as white chicken and fish and whole grains.  - avoid sweets, white starchy foods, fried foods, fast food, processed foods, sodas, red meet and other fattening foods.  - get a least 150-300 minutes of aerobic exercise per week.

## 2015-11-24 ENCOUNTER — Other Ambulatory Visit: Payer: Self-pay | Admitting: Family Medicine

## 2015-11-25 ENCOUNTER — Telehealth: Payer: Self-pay | Admitting: *Deleted

## 2015-11-25 NOTE — Telephone Encounter (Signed)
I left a detailed message at the pts home number with the results from the lab test results.

## 2015-11-25 NOTE — Telephone Encounter (Signed)
-----   Message from Bennye Alm sent at 11/24/2015  3:08 PM EST ----- Regarding: RTC Contact: [phone] Pt called to return your call from Friday, regarding her blood work.

## 2015-11-27 ENCOUNTER — Other Ambulatory Visit: Payer: Self-pay | Admitting: Family Medicine

## 2015-11-27 ENCOUNTER — Telehealth: Payer: Self-pay | Admitting: Family Medicine

## 2015-11-27 DIAGNOSIS — R7989 Other specified abnormal findings of blood chemistry: Secondary | ICD-10-CM

## 2015-11-27 NOTE — Telephone Encounter (Signed)
Pt would like jo ann to return her call concerning blood work

## 2015-11-27 NOTE — Telephone Encounter (Signed)
I called the pt and informed her of the results and she questioned when she should come back for repeat testing due to elevated platelets?

## 2016-01-01 ENCOUNTER — Other Ambulatory Visit (INDEPENDENT_AMBULATORY_CARE_PROVIDER_SITE_OTHER): Payer: BLUE CROSS/BLUE SHIELD

## 2016-01-01 ENCOUNTER — Other Ambulatory Visit: Payer: No Typology Code available for payment source

## 2016-01-01 DIAGNOSIS — D473 Essential (hemorrhagic) thrombocythemia: Secondary | ICD-10-CM | POA: Diagnosis not present

## 2016-01-01 DIAGNOSIS — R7989 Other specified abnormal findings of blood chemistry: Secondary | ICD-10-CM

## 2016-01-03 LAB — CBC WITH DIFFERENTIAL/PLATELET
BASOS ABS: 0 10*3/uL (ref 0.0–0.1)
BASOS PCT: 0.2 % (ref 0.0–3.0)
EOS ABS: 0.1 10*3/uL (ref 0.0–0.7)
Eosinophils Relative: 1.1 % (ref 0.0–5.0)
HCT: 38.5 % (ref 36.0–46.0)
Hemoglobin: 11.4 g/dL — ABNORMAL LOW (ref 12.0–15.0)
Lymphocytes Relative: 17.1 % (ref 12.0–46.0)
Lymphs Abs: 1.1 10*3/uL (ref 0.7–4.0)
MCHC: 29.6 g/dL — ABNORMAL LOW (ref 30.0–36.0)
MCV: 102.9 fl — ABNORMAL HIGH (ref 78.0–100.0)
MONO ABS: 0.7 10*3/uL (ref 0.1–1.0)
Monocytes Relative: 10.5 % (ref 3.0–12.0)
NEUTROS ABS: 4.7 10*3/uL (ref 1.4–7.7)
Neutrophils Relative %: 71.1 % (ref 43.0–77.0)
PLATELETS: 387 10*3/uL (ref 150.0–400.0)
RBC: 3.74 Mil/uL — ABNORMAL LOW (ref 3.87–5.11)
RDW: 15 % (ref 11.5–15.5)
WBC: 6.6 10*3/uL (ref 4.0–10.5)

## 2016-03-18 ENCOUNTER — Ambulatory Visit (INDEPENDENT_AMBULATORY_CARE_PROVIDER_SITE_OTHER): Payer: BLUE CROSS/BLUE SHIELD | Admitting: Family Medicine

## 2016-03-18 ENCOUNTER — Encounter: Payer: Self-pay | Admitting: Family Medicine

## 2016-03-18 VITALS — BP 100/72 | HR 56 | Ht 63.0 in | Wt 118.2 lb

## 2016-03-18 DIAGNOSIS — R7309 Other abnormal glucose: Secondary | ICD-10-CM | POA: Insufficient documentation

## 2016-03-18 DIAGNOSIS — D519 Vitamin B12 deficiency anemia, unspecified: Secondary | ICD-10-CM | POA: Diagnosis not present

## 2016-03-18 DIAGNOSIS — K625 Hemorrhage of anus and rectum: Secondary | ICD-10-CM | POA: Diagnosis not present

## 2016-03-18 DIAGNOSIS — R300 Dysuria: Secondary | ICD-10-CM

## 2016-03-18 DIAGNOSIS — L280 Lichen simplex chronicus: Secondary | ICD-10-CM

## 2016-03-18 DIAGNOSIS — R7989 Other specified abnormal findings of blood chemistry: Secondary | ICD-10-CM

## 2016-03-18 DIAGNOSIS — F325 Major depressive disorder, single episode, in full remission: Secondary | ICD-10-CM

## 2016-03-18 DIAGNOSIS — E039 Hypothyroidism, unspecified: Secondary | ICD-10-CM

## 2016-03-18 DIAGNOSIS — E119 Type 2 diabetes mellitus without complications: Secondary | ICD-10-CM | POA: Diagnosis not present

## 2016-03-18 DIAGNOSIS — F411 Generalized anxiety disorder: Secondary | ICD-10-CM

## 2016-03-18 DIAGNOSIS — E7800 Pure hypercholesterolemia, unspecified: Secondary | ICD-10-CM

## 2016-03-18 LAB — POCT URINALYSIS DIPSTICK
Bilirubin, UA: NEGATIVE
GLUCOSE UA: NEGATIVE
Ketones, UA: NEGATIVE
LEUKOCYTES UA: NEGATIVE
NITRITE UA: NEGATIVE
PH UA: 6
Protein, UA: NEGATIVE
RBC UA: NEGATIVE
Spec Grav, UA: 1.025
UROBILINOGEN UA: 0.2

## 2016-03-18 LAB — LIPID PANEL
CHOL/HDL RATIO: 9
CHOLESTEROL: 351 mg/dL — AB (ref 0–200)
HDL: 37.9 mg/dL — ABNORMAL LOW (ref >39.00)
NonHDL: 313.09
TRIGLYCERIDES: 205 mg/dL — AB (ref 0.0–149.0)
VLDL: 41 mg/dL — AB (ref 0.0–40.0)

## 2016-03-18 LAB — CBC
HCT: 35.2 % — ABNORMAL LOW (ref 36.0–46.0)
HEMOGLOBIN: 11.6 g/dL — AB (ref 12.0–15.0)
MCHC: 33.1 g/dL (ref 30.0–36.0)
MCV: 89.1 fl (ref 78.0–100.0)
PLATELETS: 382 10*3/uL (ref 150.0–400.0)
RBC: 3.95 Mil/uL (ref 3.87–5.11)
RDW: 14.7 % (ref 11.5–15.5)
WBC: 5.3 10*3/uL (ref 4.0–10.5)

## 2016-03-18 LAB — LDL CHOLESTEROL, DIRECT: LDL DIRECT: 238 mg/dL

## 2016-03-18 LAB — MICROALBUMIN / CREATININE URINE RATIO
CREATININE, U: 61.7 mg/dL
Microalb Creat Ratio: 1.3 mg/g (ref 0.0–30.0)
Microalb, Ur: 0.8 mg/dL (ref 0.0–1.9)

## 2016-03-18 LAB — HEMOGLOBIN A1C: HEMOGLOBIN A1C: 5.7 % (ref 4.6–6.5)

## 2016-03-18 LAB — TSH: TSH: 4.57 u[IU]/mL — ABNORMAL HIGH (ref 0.35–4.50)

## 2016-03-18 NOTE — Patient Instructions (Addendum)
BEFORE YOU LEAVE: -reflex culture if needed -schedule follow up in 3-4 months -labs  Call your gastroenterologist today regarding your concerns.  Please schedule your diabetic eye exam.  We recommend the following healthy lifestyle measures: - eat a healthy whole foods diet consisting of regular small meals composed of vegetables, fruits, beans, nuts, seeds, healthy meats such as white chicken and fish and whole grains.  - avoid sweets, white starchy foods, fried foods, fast food, processed foods, sodas, red meet and other fattening foods.  - get a least 150-300 minutes of aerobic exercise per week.   Let us know if your urinary symptoms recur.  -We have ordered labs or studies at this visit. It can take up to 1-2 weeks for results and processing. We will contact you with instructions IF your results are abnormal. Normal results will be released to your [facility]. If you have not heard from [place] or can not find your results in [facility] in 2 weeks please contact our office.

## 2016-03-18 NOTE — Progress Notes (Signed)
HPI:  Erin [name] is a pleasant 60 yo with and unfortunately complicated PMH here for follow up:  Dysuria: -started last week -urgency/frequency -has yeast infection and treated, now urinary symptoms resolved but wants to check urine -denies fevers, hematuria, malaise, flank pain  Hypothyroidism/Mild diet controlled DM: -increased synthroid to 16mcg 09/2015 -reports: feeling good  GERD/Chronic nausea and vomiting/IBS: -sees GI -had some constipation last week, did enema and had bleeding and has had some rectal pain with BMs -meds: bentyl,prilosec 40mg , zofran Hx Anal Ca: -managed by oncology and GI -doing well, had some constipation and did enema and had bleeding about 1 week ago- she will call GI today about this, no further bleeding  Depression/anxiety/Neurodermatitis/vertigo/chronic daily headahces: -sees neurology -meds: paxil 20mg , gabapentin 600 tid, nortriptyline -stable, mood good  B12 def: -on replacement -last b12 in 08/2015 normal -stable  Hx hip fx: -sees Dr. Percell Miller   ROS: See pertinent positives and negatives per HPI.  Past Medical History  Diagnosis Date  . 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 Chronic daily headache 03/29/2013    takes bc powder  . IBS (irritable bowel syndrome) 03/29/2013  . Anxiety   . Depression   . HLD (hyperlipidemia)   . Allergy   . Blood transfusion without reported diagnosis   . Tubular adenoma of colon 09/08/2003  . Chronic back pain   . Hot flashes   . Alopecia   . Anal cancer ([place]) 08/14/13    invasive squamous cell ca, s/p radiation 10/20-11/26/14 60.4Gy/50fx and chemo  . DM (diabetes mellitus) ([place])     diet controlled  . GERD (gastroesophageal reflux disease)   . Vertigo   . Hypothyroidism 09/10/2015  . Closed right hip fracture ([place]) 09/10/2015  . Hyperlipemia 04/11/2014  . B12 deficiency anemia 09/14/2015    Past Surgical History  Procedure Laterality Date  . Ectopic pregnancy surgery    .  Pilonidal cyst excision    . Colonoscopy    . Flexible sigmoidoscopy N/A 08/14/2013    Procedure: FLEXIBLE SIGMOIDOSCOPY;  Surgeon: Ladene Artist, MD;  [facility];  Service: Endoscopy;  Laterality: N/A;  . Polypectomy    . Tonsillectomy    . Total hip arthroplasty  09/11/2015    Procedure: TOTAL HIP ARTHROPLASTY;  Surgeon: Renette Butters, MD;  Location: [place];  Service: Orthopedics;;    Family History  Problem Relation Age of Onset  . Arthritis Mother   . Hyperlipidemia Mother   . Heart disease Mother   . Hypertension Mother   . Stroke Mother 59  . Irritable bowel syndrome Mother   . Thyroid disease Mother   . Heart disease Father   . Hyperlipidemia Father   . Hypertension Father   . Stroke Father 43  . Thyroid disease Father   . Prostate cancer Father   . Lung cancer Brother   . Colon cancer Neg Hx   . Rectal cancer Neg Hx   . Stomach cancer Neg Hx     Social History   Social History  . Marital Status: Married    Spouse Name: N/A  . Number of Children: 0  . Years of Education: N/A   Occupational History  . caregiver    Social History Main Topics  . Smoking status: Former Smoker -- 0.50 packs/day    Types: Cigarettes    Quit date: 10/07/2014  . Smokeless tobacco: Never Used  . Alcohol Use: No  . Drug Use: No  . Sexual  Activity:    Partners: Male   Other Topics Concern  . None   Social History Narrative   Work or School: homemaker      Home Situation: lives with her husband,[name] takes care of her elderly parents       Spiritual Beliefs: Christian      Lifestyle: no regular exercise, poor diet                 Current outpatient prescriptions:  .  Biotin 10 MG TABS, Take 10 mg by mouth daily., Disp: , Rfl:  .  dicyclomine (BENTYL) 10 MG capsule, Take 1 capsule (10 mg total) by mouth 3 (three) times daily before meals., Disp: 270 capsule, Rfl: 3 .  fluticasone (FLONASE) 50 MCG/ACT nasal spray, Place 1 spray into both nostrils  daily., Disp: 16 g, Rfl: 3 .  gabapentin (NEURONTIN) 600 MG tablet, Take 1 tablet (600 mg total) by mouth 3 (three) times daily., Disp: 270 tablet, Rfl: 3 .  levothyroxine (SYNTHROID, LEVOTHROID) 50 MCG tablet, Take 1 tablet (50 mcg total) by mouth daily., Disp: 90 tablet, Rfl: 1 .  methocarbamol (ROBAXIN) 500 MG tablet, Take 1 tablet (500 mg total) by mouth 4 (four) times daily., Disp: 60 tablet, Rfl: 0 .  nortriptyline (PAMELOR) 25 MG capsule, Takes 3 capsules daily, Disp: , Rfl:  .  omeprazole (PRILOSEC) 40 MG capsule, Take 1 capsule (40 mg total) by mouth daily., Disp: 90 capsule, Rfl: 3 .  ondansetron (ZOFRAN) 4 MG tablet, Take 1 tablet (4 mg total) by mouth every 8 (eight) hours as needed for nausea or vomiting., Disp: 20 tablet, Rfl: 0 .  OVER THE COUNTER MEDICATION, Take 1 capsule by mouth daily. Advance colon care II, Disp: , Rfl:  .  oxymetazoline (AFRIN) 0.05 % nasal spray, Place 1 spray into both nostrils daily as needed for congestion., Disp: , Rfl:  .  PARoxetine (PAXIL) 20 MG tablet, Take 1 tablet (20 mg total) by mouth daily., Disp: 90 tablet, Rfl: 1 .  vitamin B-12 1000 MCG tablet, Take 1 tablet (1,000 mcg total) by mouth daily., Disp: 30 tablet, Rfl: 0  EXAM:  Filed Vitals:   03/18/16 1018  BP: 100/72  Pulse: 56    Body mass index is 20.94 kg/(m^2).  GENERAL: vitals reviewed and listed above, alert, oriented, appears well hydrated and in no acute distress  HEENT: atraumatic, conjunttiva clear, no obvious abnormalities on inspection of external nose and ears  NECK: no obvious masses on inspection  LUNGS: clear to auscultation bilaterally, no wheezes, rales or rhonchi, good air movement  CV: HRRR, no peripheral edema  ABD: BS+,soft, NTTP, no CVA TTP  MS: moves all extremities without noticeable abnormality  PSYCH: pleasant and cooperative, no obvious depression or anxiety  ASSESSMENT AND PLAN:  Discussed the following assessment and plan:  Type 2 diabetes  mellitus without complication, without long-term current use of insulin (HCC) - Plan: Hemoglobin A1c, Lipid Panel, Microalbumin/Creatinine Ratio, Urine  Dysuria - Plan: POC Urinalysis Dipstick  Hypothyroidism, unspecified hypothyroidism type - Plan: TSH  Major depressive disorder with single episode, in full remission (HCC)  GAD (generalized anxiety disorder)  Neurodermatitis  B12 deficiency anemia - Plan: CBC (no diff)  BRBPR (bright red blood per rectum) - Plan: CBC (no diff)  -labs: TSH, Hgba1c, lipid, microal/cr, cbc -lifestyle recs -Udip normal -eye exam advised -she plans to call her gastroenterologist today about the rectal symptoms and bleeding she had last week -Patient advised to return or notify  a doctor immediately if symptoms worsen or persist or new concerns arise.  Patient Instructions  BEFORE YOU LEAVE: -reflex culture if needed -schedule follow up in 3-4 months -labs  Call your gastroenterologist today regarding your concerns.  Please schedule your diabetic eye exam.  We recommend the following healthy lifestyle measures: - eat a healthy whole foods diet consisting of regular small meals composed of vegetables, fruits, beans, nuts, seeds, healthy meats such as white chicken and fish and whole grains.  - avoid sweets, white starchy foods, fried foods, fast food, processed foods, sodas, red meet and other fattening foods.  - get a least 150-300 minutes of aerobic exercise per week.   Let us know if your urinary symptoms recur.  -We have ordered labs or studies at this visit. It can take up to 1-2 weeks for results and processing. We will contact you with instructions IF your results are abnormal. Normal results will be released to your [facility]. If you have not heard from [place] or can not find your results in [facility] in 2 weeks please contact our office.            Colin Benton R.

## 2016-03-18 NOTE — Progress Notes (Signed)
Pre visit review using our clinic review tool, if applicable. No additional management support is needed unless otherwise documented below in the visit note. 

## 2016-03-21 MED ORDER — LEVOTHYROXINE SODIUM 75 MCG PO TABS: 75.0000 ug | ORAL_TABLET | Freq: Every day | ORAL | Status: DC

## 2016-03-21 NOTE — Addendum Note (Signed)
Addended by: Agnes Lawrence on: 03/21/2016 10:47 AM   Modules accepted: Orders, Medications

## 2016-03-21 NOTE — Addendum Note (Signed)
Addended by: Agnes Lawrence on: 03/21/2016 10:50 AM   Modules accepted: Orders

## 2016-03-22 ENCOUNTER — Telehealth: Payer: Self-pay | Admitting: General Practice

## 2016-03-22 NOTE — Telephone Encounter (Signed)
Please call pt. Let her know: This medication is meant to be taking 3x daily. Also, this has potential interactions with the pamelor, so I am a little reluctant to increase the dose. 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 Also, I am wondering if the new dose of thyroid medication may help and maybe should see before we change other medications?

## 2016-03-23 ENCOUNTER — Ambulatory Visit: Payer: BLUE CROSS/BLUE SHIELD

## 2016-03-23 NOTE — Telephone Encounter (Signed)
Left message on machine for patient to return our call 

## 2016-03-25 ENCOUNTER — Ambulatory Visit: Payer: BLUE CROSS/BLUE SHIELD | Admitting: *Deleted

## 2016-03-25 ENCOUNTER — Other Ambulatory Visit: Payer: Self-pay | Admitting: *Deleted

## 2016-03-25 DIAGNOSIS — K2970 Gastritis, unspecified, without bleeding: Secondary | ICD-10-CM

## 2016-03-25 DIAGNOSIS — R1110 Vomiting, unspecified: Secondary | ICD-10-CM

## 2016-03-25 MED ORDER — GABAPENTIN 600 MG PO TABS: 600.0000 mg | ORAL_TABLET | Freq: Three times a day (TID) | ORAL | Status: DC

## 2016-03-25 MED ORDER — LEVOTHYROXINE SODIUM 75 MCG PO TABS: 75.0000 ug | ORAL_TABLET | Freq: Every day | ORAL | Status: DC

## 2016-03-25 NOTE — Telephone Encounter (Signed)
I called the pt and advised her of the message from Dr Maudie Mercury dated 5/2 regarding changing the dose of Gabapentin as she does not recommend increasing the dose and she agreed.  I also advised her to contact the specialist for refills on the Dicyclomine and Nortriptyline and she agreed.

## 2016-03-28 ENCOUNTER — Other Ambulatory Visit: Payer: Self-pay

## 2016-03-28 DIAGNOSIS — Z1231 Encounter for screening mammogram for malignant neoplasm of breast: Secondary | ICD-10-CM

## 2016-04-01 ENCOUNTER — Other Ambulatory Visit: Payer: Self-pay | Admitting: *Deleted

## 2016-04-04 ENCOUNTER — Telehealth: Payer: Self-pay | Admitting: Family Medicine

## 2016-04-04 MED ORDER — GABAPENTIN 600 MG PO TABS: 600.0000 mg | ORAL_TABLET | Freq: Three times a day (TID) | ORAL | Status: DC

## 2016-04-04 MED ORDER — FLUTICASONE PROPIONATE 50 MCG/ACT NA SUSP: 1.0000 | Freq: Every day | NASAL | Status: DC

## 2016-04-04 MED ORDER — PAROXETINE HCL 20 MG PO TABS: 20.0000 mg | ORAL_TABLET | Freq: Every day | ORAL | Status: DC

## 2016-04-04 MED ORDER — LEVOTHYROXINE SODIUM 75 MCG PO TABS
75.0000 ug | ORAL_TABLET | Freq: Every day | ORAL | Status: DC
Start: 2016-04-04 — End: 2016-10-31

## 2016-04-04 NOTE — Telephone Encounter (Signed)
Rx done for Paxil, Fluticasone, Levothyroxine and Gabapentin.  Note attached to denied Rx as the patient needs to contact specialist for other Rxs.

## 2016-04-04 NOTE — Telephone Encounter (Signed)
Pt cal lto say pharmacy is in [place] and the phone number is [phone]

## 2016-04-04 NOTE — Telephone Encounter (Signed)
Pt needs new rxs paxil 20,omeprazole, fluticasone #3 ,levothyroxine,gabapentin,nortriptyline 25 mg #90 each  W/refills send to [facility]

## 2016-04-06 ENCOUNTER — Ambulatory Visit
Admission: RE | Admit: 2016-04-06 | Discharge: 2016-04-06 | Disposition: A | Discharge status: Home / Self Care | Payer: BLUE CROSS/BLUE SHIELD | Source: Ambulatory Visit

## 2016-04-06 ENCOUNTER — Ambulatory Visit (INDEPENDENT_AMBULATORY_CARE_PROVIDER_SITE_OTHER): Payer: BLUE CROSS/BLUE SHIELD | Admitting: Gastroenterology

## 2016-04-06 ENCOUNTER — Encounter: Payer: Self-pay | Admitting: Gastroenterology

## 2016-04-06 VITALS — BP 148/80 | HR 80 | Ht 63.0 in | Wt 120.0 lb

## 2016-04-06 DIAGNOSIS — K219 Gastro-esophageal reflux disease without esophagitis: Secondary | ICD-10-CM

## 2016-04-06 DIAGNOSIS — Z1231 Encounter for screening mammogram for malignant neoplasm of breast: Secondary | ICD-10-CM

## 2016-04-06 DIAGNOSIS — K589 Irritable bowel syndrome without diarrhea: Secondary | ICD-10-CM | POA: Diagnosis not present

## 2016-04-06 DIAGNOSIS — K5900 Constipation, unspecified: Secondary | ICD-10-CM

## 2016-04-06 NOTE — Patient Instructions (Signed)
You can take over the counter Milk of Magnesia 1-3 tablespoons daily for adequate bowel movements.   Call back in 6 weeks if you see no improvement in your symptoms.   Thank you for choosing me and [facility].  Pricilla Riffle. Dagoberto Ligas., MD., [name]

## 2016-04-06 NOTE — Progress Notes (Signed)
    History of Present Illness: This is a 60 year old female returning for follow-up of constipation and IBS cecum history of anal cancer and a personal history of adenomatous colon polyps. She is accompanied by her husband. She states she has ongoing problems with constipation, abdominal bloating, gas and lower abdominal pain as well as generalized abdominal pain. She felt that MiraLAX was not effective and lead to pasty stools so she discontinued it. She uses Ducolax pills intermittently and fleets enemas intermittently to manage constipation. In the past Amitza lead to loose stools so she discontinued it.   FLEX SIG 09/2015 ENDOSCOPIC IMPRESSION: 1. Abnormal mucosa in the distal rectum 2. Several small abrasions in rectum 3. The mucosa appeared normal in the descending colon and sigmoid colon RECOMMENDATIONS: 1. Colonoscopy in 07/2018 for history of anal cancer and history of adenomatous colon polyps  Current Medications, Allergies, Past Medical History, Past Surgical History, Family History and Social History were reviewed in [employer] record.  Physical Exam: General: Well developed, well nourished, no acute distress Head: Normocephalic and atraumatic Eyes:  sclerae anicteric, EOMI Ears: Normal auditory acuity Mouth: No deformity or lesions Lungs: Clear throughout to auscultation Heart: Regular rate and rhythm; no murmurs, rubs or bruits Abdomen: Soft, mildly distended. Mild tenderness to deep palpation in the lower abdomen. No masses, hepatosplenomegaly or hernias noted. Normal Bowel sounds Musculoskeletal: Symmetrical with no gross deformities  Pulses:  Normal pulses noted Extremities: No clubbing, cyanosis, edema or deformities noted Neurological: Alert oriented x 4, grossly nonfocal Psychological:  Alert and cooperative. Normal mood and affect  Assessment and Recommendations:  1. Constipation, IBS-C. increase daily water and fiber intake. Milk of magnesia  1-3 tablespoons daily titrated for adequate bowel movements. We discussed using this medication on a daily basis to help prevent constipation instead of waiting to treat constipation once it occurs. Continue dicyclomine 10 mg 3 times a day before meals as needed. Gas-X 4 times daily as needed. She is advised to discontinue the use of enemas. May use Dulcolax pills for refractory constipation. Call back in 4 - 6 weeks if constipation or other GI symptoms are not well controlled. Consider Linzess.  2. GERD. Continue omeprazole 40 mg daily.  3. Personal history of anal cancer and personal history of adenomatous colon polyps. Surveillance colonoscopy 07/2018.

## 2016-04-08 ENCOUNTER — Encounter: Payer: Self-pay | Admitting: Interventional Cardiology

## 2016-04-08 ENCOUNTER — Ambulatory Visit (INDEPENDENT_AMBULATORY_CARE_PROVIDER_SITE_OTHER): Payer: BLUE CROSS/BLUE SHIELD | Admitting: Interventional Cardiology

## 2016-04-08 VITALS — BP 140/90 | HR 80 | Ht 63.0 in | Wt 120.0 lb

## 2016-04-08 DIAGNOSIS — Z8249 Family history of ischemic heart disease and other diseases of the circulatory system: Secondary | ICD-10-CM | POA: Diagnosis not present

## 2016-04-08 DIAGNOSIS — E785 Hyperlipidemia, unspecified: Secondary | ICD-10-CM

## 2016-04-08 HISTORY — DX: Family history of ischemic heart disease and other diseases of the circulatory system: Z82.49

## 2016-04-08 MED ORDER — EZETIMIBE 10 MG PO TABS: 10.0000 mg | ORAL_TABLET | Freq: Every day | ORAL | Status: DC

## 2016-04-08 NOTE — Patient Instructions (Signed)
Medication Instructions:  Start taking Zetia 10 mg daily  Labwork: In 2 months-Lipids and LFTS. Please do not eat or drink after midnight the night before labs are drawn  Testing/Procedures: None  Follow-Up: With Lipid Clinic in 3 months.     If you need a refill on your cardiac medications before your next appointment, please call your pharmacy.

## 2016-04-08 NOTE — Progress Notes (Signed)
Patient ID: Erin [name], female   DOB: 10-Apr-1956, 60 y.o.   MRN: [id number]     Cardiology Office Note   Date:  04/08/2016   ID:  Erin [name], DOB 1956/04/12, MRN [id number]  PCP:  [name], DO    No chief complaint on file.  High cholesterol  Wt Readings from Last 3 Encounters:  04/08/16 120 lb (54.432 kg)  04/06/16 120 lb (54.432 kg)  03/18/16 118 lb 3.2 oz (53.615 kg)       History of Present Illness: Erin [name] is a 60 y.o. female  Who has hyperlipidemia for many years.  She has never had any cardiac Testing or problems other than hyperlipidemia. She has been on atorvastatin and pravastatin in the past. She does not recall any other medications that she has taken. With both statins, she is at severe leg weakness to the point that she cannot walk.  She admits to a diet which is heavy in pizza and cheese burgers. She does not really try to eat well or exercise. She feels that she just wants to enjoy life and when it is her time to go, it will be her time to go.  She denies chest pain or shortness of breath. Her main complaint is related to abdominal pain from IBS.    Past Medical History  Diagnosis Date  . 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 Chronic daily headache 03/29/2013    takes bc powder  . IBS (irritable bowel syndrome) 03/29/2013  . Anxiety   . Depression   . HLD (hyperlipidemia)   . Allergy   . Blood transfusion without reported diagnosis   . Tubular adenoma of colon 09/08/2003  . Chronic back pain   . Hot flashes   . Alopecia   . Anal cancer ([place]) 08/14/13    invasive squamous cell ca, s/p radiation 10/20-11/26/14 60.4Gy/96fx and chemo  . DM (diabetes mellitus) ([place])     diet controlled  . GERD (gastroesophageal reflux disease)   . Vertigo   . Hypothyroidism 09/10/2015  . Closed right hip fracture ([place]) 09/10/2015  . Hyperlipemia 04/11/2014  . B12 deficiency anemia 09/14/2015    Past Surgical History  Procedure  Laterality Date  . Ectopic pregnancy surgery    . Pilonidal cyst excision    . Colonoscopy    . Flexible sigmoidoscopy N/A 08/14/2013    Procedure: FLEXIBLE SIGMOIDOSCOPY;  Surgeon: Ladene Artist, MD;  Location: [facility];  Service: Endoscopy;  Laterality: N/A;  . Polypectomy    . Tonsillectomy    . Total hip arthroplasty  09/11/2015    Procedure: TOTAL HIP ARTHROPLASTY;  Surgeon: Renette Butters, MD;  Location: [facility];  Service: Orthopedics;;     Current Outpatient Prescriptions  Medication Sig Dispense Refill  . Biotin 10 MG TABS Take 10 mg by mouth daily.    Marland Kitchen dicyclomine (BENTYL) 10 MG capsule Take 1 capsule (10 mg total) by mouth 3 (three) times daily before meals. 270 capsule 3  . fluticasone (FLONASE) 50 MCG/ACT nasal spray Place 1 spray into both nostrils daily. 48 g 1  . gabapentin (NEURONTIN) 600 MG tablet Take 1 tablet (600 mg total) by mouth 3 (three) times daily. 270 tablet 1  . levothyroxine (SYNTHROID, LEVOTHROID) 75 MCG tablet Take 1 tablet (75 mcg total) by mouth daily. 90 tablet 1  . nortriptyline (PAMELOR) 25 MG capsule Takes 3 capsules daily    . omeprazole (PRILOSEC) 40 MG capsule Take  1 capsule (40 mg total) by mouth daily. 90 capsule 3  . ondansetron (ZOFRAN) 4 MG tablet Take 1 tablet (4 mg total) by mouth every 8 (eight) hours as needed for nausea or vomiting. 20 tablet 0  . OVER THE COUNTER MEDICATION Take 1 capsule by mouth daily. Advance colon care II    . oxymetazoline (AFRIN) 0.05 % nasal spray Place 1 spray into both nostrils daily as needed for congestion.    Marland Kitchen PARoxetine (PAXIL) 20 MG tablet Take 1 tablet (20 mg total) by mouth daily. 90 tablet 1  . vitamin B-12 1000 MCG tablet Take 1 tablet (1,000 mcg total) by mouth daily. 30 tablet 0   No current facility-administered medications for this visit.    Allergies:   Atorvastatin; Claritin; Dexamethasone; Sulfa antibiotics; and Prednisone    Social History:  The patient  reports that she quit smoking  about 18 months ago. Her smoking use included Cigarettes. She smoked 0.50 packs per day. She has never used smokeless tobacco. She reports that she does not drink alcohol or use illicit drugs.   Family History:  The patient's family history includes Arthritis in her mother; Heart attack in her father, maternal grandfather, maternal uncle, and mother; Heart disease in her father and mother; Hyperlipidemia in her father and mother; Hypertension in her father and mother; Irritable bowel syndrome in her mother; Lung cancer in her brother; Prostate cancer in her father; Stroke (age of onset: 40) in her mother; Stroke (age of onset: 62) in her father; Thyroid disease in her father and mother. There is no history of Colon cancer, Rectal cancer, or Stomach cancer.    ROS:  Please see the history of present illness.   Otherwise, review of systems are positive for constipation.   All other systems are reviewed and negative.    PHYSICAL EXAM: VS:  BP 140/90 mmHg  Pulse 80  Ht 5\' 3"  (1.6 m)  Wt 120 lb (54.432 kg)  BMI 21.26 kg/m2 , BMI Body mass index is 21.26 kg/(m^2). GEN: Well nourished, well developed, in no acute distress HEENT: normal Neck: no JVD, carotid bruits, or masses Cardiac: RRR; no murmurs, rubs, or gallops,no edema  Respiratory:  clear to auscultation bilaterally, normal work of breathing GI: soft, nontender, nondistended, + BS MS: no deformity or atrophy Skin: warm and dry, no rash Neuro:  Strength and sensation are intact Psych: euthymic mood, full affect   EKG:   The ekg ordered today demonstrates NSR, no ST segment changes   Recent Labs: 06/27/2015: Magnesium 2.3 09/10/2015: ALT 12* 10/09/2015: BUN 11; Creatinine, Ser 0.90; Potassium 4.6; Sodium 138 03/18/2016: Hemoglobin 11.6*; Platelets 382.0; TSH 4.57*   Lipid Panel    Component Value Date/Time   CHOL 351* 03/18/2016 1058   TRIG 205.0* 03/18/2016 1058   HDL 37.90* 03/18/2016 1058   CHOLHDL 9 03/18/2016 1058   VLDL  41.0* 03/18/2016 1058   LDLCALC 217* 03/09/2015 1039   LDLDIRECT 238.0 03/18/2016 1058     Other studies Reviewed: Additional studies/ records that were reviewed today with results demonstrating: .   ASSESSMENT AND PLAN:  1. Hyperlipidemia: Start Zetia 10 mg daily.  Recheck lipids in 2 months. She'll follow-up in lipid clinic.  She would benefit from eating healthier diet with less saturated fats. 2. Borderline blood pressure: I recommend that she decrease the salt intake in her diet. 3. Regular exercise would help both her blood pressure and her cholesterol. She does have a family history of early coronary  artery disease. I encouraged her to try to modify the risk factors that she can.   Current medicines are reviewed at length with the patient today.  The patient concerns regarding her medicines were addressed.  The following changes have been made:  Start Zetia  Labs/ tests ordered today include: Lipids, liver in 2 months  No orders of the defined types were placed in this encounter.    Recommend 150 minutes/week of aerobic exercise Low fat, low carb, high fiber diet recommended  Disposition:   FU in with pharmD clinic   Signed, Larae Grooms, MD  04/08/2016 11:51 AM    [address], [place], [place]  [address] Phone: [phone]; Fax: [phone]

## 2016-04-21 ENCOUNTER — Telehealth: Payer: Self-pay | Admitting: Gastroenterology

## 2016-04-21 MED ORDER — LINACLOTIDE 145 MCG PO CAPS: 145.0000 ug | ORAL_CAPSULE | Freq: Every day | ORAL | Status: DC

## 2016-04-21 NOTE — Telephone Encounter (Signed)
Patient notified

## 2016-04-21 NOTE — Telephone Encounter (Signed)
Patient has returned to Miralax because she can't drink MOM recommended at the last office visit.  She is taking qid gas-x.  She reports terrible gas and bloating.  Do you want to try linzess as listed in your office note?

## 2016-04-21 NOTE — Telephone Encounter (Signed)
Linzess 145 mcg qd

## 2016-05-17 ENCOUNTER — Telehealth: Payer: Self-pay | Admitting: Gastroenterology

## 2016-05-17 MED ORDER — LINACLOTIDE 290 MCG PO CAPS: 290.0000 ug | ORAL_CAPSULE | Freq: Every day | ORAL | Status: DC

## 2016-05-17 NOTE — Telephone Encounter (Signed)
Pt states that the Linzess 145 mcg is not working her last BM was Saturday, she is bloated and gassy.  She took dulcolax to make her go on Saturday. She is taking the linzess every morning at 10 am.  She wants to know if she can increase the Linzess to 290 mcg or change to something else. She is not drinking any water only 4-6 sodas daily.  Advised to drink at least 6-8 glasses of water daily and increase her fiber. She should try and limit her sodas as well.   Please advise

## 2016-05-17 NOTE — Telephone Encounter (Signed)
Drink 6-8 glasses of water and increase dietary fiber intake Increase Linzess to 290 mcg daily

## 2016-05-17 NOTE — Telephone Encounter (Signed)
Pt advised of Dr Fuller Plan recommendations and prescription sent for [id number]

## 2016-05-25 ENCOUNTER — Other Ambulatory Visit: Payer: Self-pay

## 2016-05-25 MED ORDER — OMEPRAZOLE 40 MG PO CPDR: 40.0000 mg | DELAYED_RELEASE_CAPSULE | Freq: Every day | ORAL | Status: DC

## 2016-05-30 ENCOUNTER — Telehealth: Payer: Self-pay | Admitting: Nurse Practitioner

## 2016-05-30 ENCOUNTER — Ambulatory Visit (HOSPITAL_BASED_OUTPATIENT_CLINIC_OR_DEPARTMENT_OTHER): Payer: BLUE CROSS/BLUE SHIELD | Admitting: Oncology

## 2016-05-30 ENCOUNTER — Other Ambulatory Visit: Payer: Self-pay | Admitting: *Deleted

## 2016-05-30 VITALS — BP 136/77 | HR 87 | Temp 98.2°F | Resp 17 | Ht 63.0 in | Wt 117.3 lb

## 2016-05-30 DIAGNOSIS — Z85048 Personal history of other malignant neoplasm of rectum, rectosigmoid junction, and anus: Secondary | ICD-10-CM | POA: Diagnosis not present

## 2016-05-30 DIAGNOSIS — C210 Malignant neoplasm of anus, unspecified: Secondary | ICD-10-CM

## 2016-05-30 MED ORDER — ONDANSETRON HCL 4 MG PO TABS: 4.0000 mg | ORAL_TABLET | Freq: Three times a day (TID) | ORAL | Status: DC | PRN

## 2016-05-30 NOTE — Progress Notes (Signed)
  [facility] OFFICE PROGRESS NOTE   Diagnosis: Anal cancer  INTERVAL HISTORY:   Erin [name] returns as scheduled. She reports improvement in abdominal bloating and constipation since discontinuing nortriptyline. She has intermittent nausea. Erin [name]  is followed by Dr. Fuller Plan for irregular bowel habits. She is now taking Linzess. She last underwent a sigmoidoscopy in November 2016.  Objective:  Vital signs in last 24 hours:  Blood pressure 136/77, pulse 87, temperature 98.2 F (36.8 C), temperature source Oral, resp. rate 17, height 5\' 3"  (1.6 m), weight 117 lb 4.8 oz (53.207 kg), SpO2 100 %.    HEENT: Neck without mass Lymphatics: No cervical, supraclavicular, axillary, or inguinal nodes Resp: Lungs clear bilaterally Cardio: Regular rate and rhythm GI: No hepatosplenomegaly, no mass, nontender Vascular: No leg edema     Medications: I have reviewed the patient's current medications.  Assessment/Plan: 1. Squamous cell carcinoma of the anus, clinical stage II (T2 N0), status post endoscopic biopsy 08/14/2013 confirming invasive squamous cell carcinoma. Initiation of radiation and cycle 1 5-FU/mitomycin C. 09/09/2013. Cycle 2 5-FU/mitomycin-C (dose reduction) completed beginning 10/08/2013, radiation completed 10/16/2013. 2. Rectal pain, constipation and bleeding secondary to #1. Resolved. 3. History of Iron deficiency anemia. 4. Vitamin B12 deficiency. 5. Diabetes. 6. Hyperlipidemia. 7. Neurodermatitis. 8. Tobacco use. She reports she has quit smoking. 9. Mucositis following cycle 1 and cycle 2 5-FU/mitomycin C.  10. Skin rash. Initially felt to be related to the chemotherapy though somewhat atypical in appearance and distribution. The rash has resolved. 11. 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  12. History of hypokalemia. 13. Status post sigmoidoscopy 03/10/2014 with findings of distal rectal/anal  canal nodularity measuring 2 cm x 2 cm. Biopsy showed benign ulcerated anorectal junction mucosa and inflammatory debris. Within the debris there were markedly atypical epithelial cell fragments and single cells. 14. Status post sigmoidoscopy 09/23/2014. Area of abnormal mucosa in the distal rectum/anal canal-friable, erythematous, scarring; multiple biopsies performed. Pathology revealed no dysplasia or malignancy. 15. Bilateral hip pain. Status post CT abdomen/pelvis 08/11/2014 with no etiology for the pain identified. Resolved after physical therapy. 16. Right hip fracture following a fall 09/10/2015 status post total hip arthroplasty 09/11/2015   Disposition:  Erin [name] remains in clinical remission from anal cancer. We will ask her to return here or see Dr. Fuller Plan within the next few months for a rectal exam. She will be scheduled for an office visit in 6 months.  Betsy Coder, MD  05/30/2016  12:06 PM

## 2016-05-30 NOTE — Telephone Encounter (Signed)
per pof to sch pt appt-gave pt copy of avs °

## 2016-06-02 ENCOUNTER — Telehealth: Payer: Self-pay | Admitting: *Deleted

## 2016-06-02 NOTE — Telephone Encounter (Signed)
Oncology Nurse Navigator Documentation  Oncology Nurse Navigator Flowsheets 06/02/2016  Navigator Location [facility]  Navigator Encounter Type Telephone  Telephone Outgoing Call;Patient Update   Per Dr. Benay Spice, the note from Dr. Fuller Plan does not document a rectal exam. NCCN guidelines recommend rectal exam every 3-6 months. Her last exam via sigmoidoscopy was 10/21/15. MD wants her to schedule appointment in 1-2 months with him or Dr. Fuller Plan for a rectal exam. Husband answered phone and he will have patient call navigator back.

## 2016-06-03 NOTE — Telephone Encounter (Signed)
Return call from patient: Informed her she needs rectal exam in next 1-2 months by Dr. Fuller Plan or Dr. Benay Spice. She requests Dr. Benay Spice. Prefers Monday or Friday (mid-morning). POF to scheduler.

## 2016-06-06 ENCOUNTER — Observation Stay (HOSPITAL_COMMUNITY)
Admission: EM | Admit: 2016-06-06 | Discharge: 2016-06-08 | Disposition: A | Discharge status: Home / Self Care | Payer: BLUE CROSS/BLUE SHIELD | Attending: Internal Medicine | Admitting: Internal Medicine

## 2016-06-06 ENCOUNTER — Emergency Department (HOSPITAL_COMMUNITY): Payer: BLUE CROSS/BLUE SHIELD

## 2016-06-06 ENCOUNTER — Telehealth: Payer: Self-pay | Admitting: Family Medicine

## 2016-06-06 ENCOUNTER — Encounter (HOSPITAL_COMMUNITY): Payer: Self-pay | Admitting: Emergency Medicine

## 2016-06-06 DIAGNOSIS — E785 Hyperlipidemia, unspecified: Secondary | ICD-10-CM | POA: Diagnosis not present

## 2016-06-06 DIAGNOSIS — I5032 Chronic diastolic (congestive) heart failure: Secondary | ICD-10-CM | POA: Diagnosis not present

## 2016-06-06 DIAGNOSIS — E119 Type 2 diabetes mellitus without complications: Secondary | ICD-10-CM | POA: Diagnosis not present

## 2016-06-06 DIAGNOSIS — R112 Nausea with vomiting, unspecified: Secondary | ICD-10-CM | POA: Diagnosis not present

## 2016-06-06 DIAGNOSIS — K589 Irritable bowel syndrome without diarrhea: Secondary | ICD-10-CM | POA: Diagnosis present

## 2016-06-06 DIAGNOSIS — F329 Major depressive disorder, single episode, unspecified: Secondary | ICD-10-CM | POA: Diagnosis not present

## 2016-06-06 DIAGNOSIS — F419 Anxiety disorder, unspecified: Secondary | ICD-10-CM | POA: Insufficient documentation

## 2016-06-06 DIAGNOSIS — R531 Weakness: Secondary | ICD-10-CM | POA: Insufficient documentation

## 2016-06-06 DIAGNOSIS — E876 Hypokalemia: Secondary | ICD-10-CM | POA: Diagnosis not present

## 2016-06-06 DIAGNOSIS — E039 Hypothyroidism, unspecified: Secondary | ICD-10-CM | POA: Diagnosis present

## 2016-06-06 DIAGNOSIS — R079 Chest pain, unspecified: Secondary | ICD-10-CM | POA: Diagnosis present

## 2016-06-06 DIAGNOSIS — Z9221 Personal history of antineoplastic chemotherapy: Secondary | ICD-10-CM | POA: Insufficient documentation

## 2016-06-06 DIAGNOSIS — C210 Malignant neoplasm of anus, unspecified: Secondary | ICD-10-CM | POA: Diagnosis present

## 2016-06-06 DIAGNOSIS — Z923 Personal history of irradiation: Secondary | ICD-10-CM | POA: Insufficient documentation

## 2016-06-06 DIAGNOSIS — R740 Nonspecific elevation of levels of transaminase and lactic acid dehydrogenase [LDH]: Secondary | ICD-10-CM

## 2016-06-06 DIAGNOSIS — Z85048 Personal history of other malignant neoplasm of rectum, rectosigmoid junction, and anus: Secondary | ICD-10-CM | POA: Insufficient documentation

## 2016-06-06 DIAGNOSIS — Z87891 Personal history of nicotine dependence: Secondary | ICD-10-CM | POA: Insufficient documentation

## 2016-06-06 DIAGNOSIS — R55 Syncope and collapse: Secondary | ICD-10-CM | POA: Diagnosis not present

## 2016-06-06 LAB — HEPATIC FUNCTION PANEL
ALBUMIN: 3.5 g/dL (ref 3.5–5.0)
ALK PHOS: 131 U/L — AB (ref 38–126)
ALT: 41 U/L (ref 14–54)
AST: 103 U/L — ABNORMAL HIGH (ref 15–41)
BILIRUBIN TOTAL: 0.5 mg/dL (ref 0.3–1.2)
Bilirubin, Direct: <0.1 mg/dL — ABNORMAL LOW (ref 0.1–0.5)
Total Protein: 6.9 g/dL (ref 6.5–8.1)

## 2016-06-06 LAB — CBC
HEMATOCRIT: 38.2 % (ref 36.0–46.0)
Hemoglobin: 12.6 g/dL (ref 12.0–15.0)
MCH: 29.8 pg (ref 26.0–34.0)
MCHC: 33 g/dL (ref 30.0–36.0)
MCV: 90.3 fL (ref 78.0–100.0)
PLATELETS: 435 10*3/uL — AB (ref 150–400)
RBC: 4.23 MIL/uL (ref 3.87–5.11)
RDW: 14.5 % (ref 11.5–15.5)
WBC: 9.9 10*3/uL (ref 4.0–10.5)

## 2016-06-06 LAB — BASIC METABOLIC PANEL
Anion gap: 14 (ref 5–15)
CHLORIDE: 95 mmol/L — AB (ref 101–111)
CO2: 32 mmol/L (ref 22–32)
CREATININE: 0.82 mg/dL (ref 0.44–1.00)
Calcium: 8.8 mg/dL — ABNORMAL LOW (ref 8.9–10.3)
GFR calc non Af Amer: >60 mL/min (ref >60)
Glucose, Bld: 114 mg/dL — ABNORMAL HIGH (ref 65–99)
Sodium: 141 mmol/L (ref 135–145)

## 2016-06-06 LAB — MAGNESIUM: Magnesium: 1.8 mg/dL (ref 1.7–2.4)

## 2016-06-06 LAB — ETHANOL: Alcohol, Ethyl (B): <5 mg/dL (ref <5)

## 2016-06-06 LAB — I-STAT TROPONIN, ED: Troponin i, poc: 0.06 ng/mL (ref 0.00–0.08)

## 2016-06-06 LAB — TSH: TSH: 2.285 u[IU]/mL (ref 0.350–4.500)

## 2016-06-06 LAB — D-DIMER, QUANTITATIVE (NOT AT ARMC): D DIMER QUANT: 1.36 ug{FEU}/mL — AB (ref 0.00–0.50)

## 2016-06-06 LAB — LIPASE, BLOOD: LIPASE: 22 U/L (ref 11–51)

## 2016-06-06 LAB — TROPONIN I: Troponin I: 0.09 ng/mL (ref <0.03)

## 2016-06-06 MED ORDER — POTASSIUM CHLORIDE 10 MEQ/100ML IV SOLN
10.0000 meq | INTRAVENOUS | Status: DC
  Administered 2016-06-07 (×2): 10 meq via INTRAVENOUS
  Filled 2016-06-06 (×2): qty 100

## 2016-06-06 MED ORDER — PROCHLORPERAZINE EDISYLATE 5 MG/ML IJ SOLN
10.0000 mg | INTRAMUSCULAR | Status: DC | PRN
  Filled 2016-06-06: qty 2

## 2016-06-06 MED ORDER — SODIUM CHLORIDE 0.9 % IV SOLN
INTRAVENOUS | Status: DC
  Administered 2016-06-07: 21:00:00 via INTRAVENOUS

## 2016-06-06 MED ORDER — ENOXAPARIN SODIUM 40 MG/0.4ML ~~LOC~~ SOLN
40.0000 mg | Freq: Every day | SUBCUTANEOUS | Status: DC
  Administered 2016-06-07 (×2): 40 mg via SUBCUTANEOUS
  Filled 2016-06-06 (×2): qty 0.4

## 2016-06-06 MED ORDER — PAROXETINE HCL 20 MG PO TABS
20.0000 mg | ORAL_TABLET | Freq: Every day | ORAL | Status: DC
  Administered 2016-06-07 – 2016-06-08 (×2): 20 mg via ORAL
  Filled 2016-06-06 (×2): qty 1

## 2016-06-06 MED ORDER — OXYMETAZOLINE HCL 0.05 % NA SOLN: 1.0000 | Freq: Every day | NASAL | Status: DC | PRN

## 2016-06-06 MED ORDER — PANTOPRAZOLE SODIUM 40 MG PO TBEC
40.0000 mg | DELAYED_RELEASE_TABLET | Freq: Two times a day (BID) | ORAL | Status: DC
  Administered 2016-06-07 – 2016-06-08 (×3): 40 mg via ORAL
  Filled 2016-06-06 (×3): qty 1

## 2016-06-06 MED ORDER — SODIUM CHLORIDE 0.9% FLUSH
3.0000 mL | Freq: Two times a day (BID) | INTRAVENOUS | Status: DC
  Administered 2016-06-07 (×2): 3 mL via INTRAVENOUS

## 2016-06-06 MED ORDER — ONDANSETRON HCL 4 MG/2ML IJ SOLN
4.0000 mg | Freq: Once | INTRAMUSCULAR | Status: AC
  Administered 2016-06-06: 4 mg via INTRAVENOUS
  Filled 2016-06-06: qty 2

## 2016-06-06 MED ORDER — LEVOTHYROXINE SODIUM 75 MCG PO TABS
75.0000 ug | ORAL_TABLET | Freq: Every day | ORAL | Status: DC
  Administered 2016-06-07 – 2016-06-08 (×2): 75 ug via ORAL
  Filled 2016-06-06 (×2): qty 1

## 2016-06-06 MED ORDER — IOPAMIDOL (ISOVUE-370) INJECTION 76%
INTRAVENOUS | Status: AC
  Administered 2016-06-06: 100 mL
  Filled 2016-06-06: qty 100

## 2016-06-06 MED ORDER — FLUTICASONE PROPIONATE 50 MCG/ACT NA SUSP
1.0000 | Freq: Every day | NASAL | Status: DC
  Administered 2016-06-07 – 2016-06-08 (×2): 1 via NASAL
  Filled 2016-06-06: qty 16

## 2016-06-06 MED ORDER — ALBUTEROL SULFATE (2.5 MG/3ML) 0.083% IN NEBU: 2.5000 mg | INHALATION_SOLUTION | RESPIRATORY_TRACT | Status: DC | PRN

## 2016-06-06 MED ORDER — POTASSIUM CHLORIDE 10 MEQ/100ML IV SOLN
10.0000 meq | Freq: Once | INTRAVENOUS | Status: AC
  Administered 2016-06-06: 10 meq via INTRAVENOUS
  Filled 2016-06-06: qty 100

## 2016-06-06 MED ORDER — DICYCLOMINE HCL 10 MG PO CAPS
10.0000 mg | ORAL_CAPSULE | Freq: Three times a day (TID) | ORAL | Status: DC
  Administered 2016-06-07 – 2016-06-08 (×5): 10 mg via ORAL
  Filled 2016-06-06 (×5): qty 1

## 2016-06-06 MED ORDER — LORAZEPAM 2 MG/ML IJ SOLN
1.0000 mg | Freq: Once | INTRAMUSCULAR | Status: AC
  Administered 2016-06-06: 1 mg via INTRAVENOUS
  Filled 2016-06-06: qty 1

## 2016-06-06 MED ORDER — GABAPENTIN 300 MG PO CAPS
600.0000 mg | ORAL_CAPSULE | Freq: Three times a day (TID) | ORAL | Status: DC
  Administered 2016-06-07 – 2016-06-08 (×6): 600 mg via ORAL
  Filled 2016-06-06 (×5): qty 2

## 2016-06-06 MED ORDER — POTASSIUM CHLORIDE CRYS ER 20 MEQ PO TBCR
80.0000 meq | EXTENDED_RELEASE_TABLET | Freq: Once | ORAL | Status: AC
  Administered 2016-06-06: 80 meq via ORAL
  Filled 2016-06-06: qty 4

## 2016-06-06 MED ORDER — SODIUM CHLORIDE 0.9 % IV BOLUS (SEPSIS)
1000.0000 mL | Freq: Once | INTRAVENOUS | Status: AC
  Administered 2016-06-06: 1000 mL via INTRAVENOUS

## 2016-06-06 MED ORDER — LINACLOTIDE 145 MCG PO CAPS
290.0000 ug | ORAL_CAPSULE | Freq: Every day | ORAL | Status: DC
  Administered 2016-06-07 – 2016-06-08 (×2): 290 ug via ORAL
  Filled 2016-06-06: qty 2
  Filled 2016-06-06: qty 1

## 2016-06-06 MED ORDER — MAGNESIUM SULFATE IN D5W 1-5 GM/100ML-% IV SOLN
1.0000 g | Freq: Once | INTRAVENOUS | Status: AC
  Administered 2016-06-07: 1 g via INTRAVENOUS
  Filled 2016-06-06 (×2): qty 100

## 2016-06-06 NOTE — ED Provider Notes (Signed)
CSN: [id number]     Arrival date & time 06/06/16  1723 History   First MD Initiated Contact with Patient 06/06/16 1806     Chief Complaint  Patient presents with  . Chest Pain  . Shortness of Breath     (Consider location/radiation/quality/duration/timing/severity/associated sxs/prior Treatment) HPI   Patient is a 60 year old female past medical history of hypertension, diabetes, GERD, hyperlipidemia and anal cancer who presents the ED with multiple complaints. Patient reports over the past 2 days she has had intermittent waxing and waning lower mid sternal chest pain which she describes as a gripping tightness. She notes chest pain typically lasts for few minutes and then resolved spontaneously. Denies any aggravating or alleviating factors. Denies radiation. Endorses associated shortness of breath and palpitations with each episode. Denies fever, chills, headache, diaphoresis, nasal congestion, rhinorrhea, cough, wheezing. She notes she has taken ibuprofen intermittently at home with mild relief. Patient endorses smoking 3 cigarettes per day. Denies personal history of cardiac disease, reports family history of cardiac disease. She also reports having her main episodes of abdominal cramping which she reports "feels like gas". She notes her abdominal pain is consistent with pain she has had in the past related to her IBS. Denies fever, diarrhea, urinary symptoms, vaginal bleeding, vaginal discharge, leg swelling. Patient denies use of exogenous estrogen.  Patient also notes yesterday while she was at home caring for her mother, after going up and down the stairs multiple times she began to feel lightheaded with associated nausea. She reports having one episode of NBNB vomiting prior to laying on the ground. Patient denies fall, head injury or LOC. She notes after laying on the ground for a few minutes her symptoms improved and she was able to stand and ambulate without assistance. Patient reports  having similar episodes of lightheadedness with change in position.  Patient reports having numbness, tingling and weakness to bilateral arms from elbows extending to hands for the past 3 weeks. She notes it is worse after working on her desk top at work. Patient reports pain is worse with movement of her wrist. Denies any recent injury or trauma. Denies swelling, redness, warmth.  Past Medical History  Diagnosis Date  . 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 Chronic daily headache 03/29/2013    takes bc powder  . IBS (irritable bowel syndrome) 03/29/2013  . Anxiety   . Depression   . HLD (hyperlipidemia)   . Allergy   . Blood transfusion without reported diagnosis   . Tubular adenoma of colon 09/08/2003  . Chronic back pain   . Hot flashes   . Alopecia   . 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 DM (diabetes mellitus) ([place])     diet controlled  . GERD (gastroesophageal reflux disease)   . Vertigo   . Hypothyroidism 09/10/2015  . Closed right hip fracture ([place]) 09/10/2015  . Hyperlipemia 04/11/2014  . B12 deficiency anemia 09/14/2015   Past Surgical History  Procedure Laterality Date  . Ectopic pregnancy surgery    . Pilonidal cyst excision    . Colonoscopy    . Flexible sigmoidoscopy N/A 08/14/2013    Procedure: FLEXIBLE SIGMOIDOSCOPY;  Surgeon: Ladene Artist, MD;  Location: [facility];  Service: Endoscopy;  Laterality: N/A;  . Polypectomy    . Tonsillectomy    . Total hip arthroplasty  09/11/2015    Procedure: TOTAL HIP ARTHROPLASTY;  Surgeon: Renette Butters,  MD;  Location: [facility] OR;  Service: Orthopedics;;   Family History  Problem Relation Age of Onset  . Arthritis Mother   . Hyperlipidemia Mother   . Heart disease Mother   . Hypertension Mother   . Stroke Mother 69  . Irritable bowel syndrome Mother   . Thyroid disease Mother   . Heart disease Father   . Hyperlipidemia Father   .  Hypertension Father   . Stroke Father 5  . Thyroid disease Father   . Prostate cancer Father   . Lung cancer Brother   . Colon cancer Neg Hx   . Rectal cancer Neg Hx   . Stomach cancer Neg Hx   . Heart attack Mother   . Heart attack Father   . Heart attack Maternal Grandfather   . Heart attack Maternal Uncle    Social History  Substance Use Topics  . Smoking status: Former Smoker -- 0.50 packs/day    Types: Cigarettes    Quit date: 10/07/2014  . Smokeless tobacco: Never Used  . Alcohol Use: No   OB History    No data available     Review of Systems  Respiratory: Positive for shortness of breath.   Cardiovascular: Positive for chest pain.  Gastrointestinal: Positive for nausea, vomiting and abdominal pain.  Neurological: Positive for light-headedness.  All other systems reviewed and are negative.     Allergies  Amitiza; Claritin; Dexamethasone; Sulfa antibiotics; Atorvastatin; Nortriptyline; and Prednisone  Home Medications   Prior to Admission medications   Medication Sig Start Date End Date Taking? Authorizing Provider  dicyclomine (BENTYL) 10 MG capsule Take 1 capsule (10 mg total) by mouth 3 (three) times daily before meals. 10/29/15  Yes Ladene Artist, MD  fluticasone (FLONASE) 50 MCG/ACT nasal spray Place 1 spray into both nostrils daily. 04/04/16  Yes Lucretia Kern, DO  gabapentin (NEURONTIN) 600 MG tablet Take 1 tablet (600 mg total) by mouth 3 (three) times daily. 04/04/16  Yes Lucretia Kern, DO  levothyroxine (SYNTHROID, LEVOTHROID) 75 MCG tablet Take 1 tablet (75 mcg total) by mouth daily. 04/04/16  Yes Lucretia Kern, DO  linaclotide [facility]) 290 MCG CAPS capsule Take 1 capsule (290 mcg total) by mouth daily before breakfast. 05/17/16  Yes Ladene Artist, MD  omeprazole (PRILOSEC) 40 MG capsule Take 1 capsule (40 mg total) by mouth daily. 05/25/16  Yes Ladene Artist, MD  PARoxetine (PAXIL) 20 MG tablet Take 1 tablet (20 mg total) by mouth daily. 04/04/16  Yes  Lucretia Kern, DO  ondansetron (ZOFRAN) 4 MG tablet Take 1 tablet (4 mg total) by mouth every 8 (eight) hours as needed for nausea or vomiting. 05/30/16   Ladell Pier, MD  vitamin B-12 1000 MCG tablet Take 1 tablet (1,000 mcg total) by mouth daily. 09/14/15   Nishant Dhungel, MD   BP 150/88 mmHg  Pulse 91  Temp(Src) 98.5 F (36.9 C) (Oral)  Resp 20  SpO2 97% Physical Exam  Constitutional: She is oriented to person, place, and time. She appears well-developed and well-nourished.  Patient appears anxious on exam  HENT:  Head: Normocephalic and atraumatic.  Mouth/Throat: Oropharynx is clear and moist. No oropharyngeal exudate.  Eyes: Conjunctivae and EOM are normal. Pupils are equal, round, and reactive to light. Right eye exhibits no discharge. Left eye exhibits no discharge. No scleral icterus.  Neck: Normal range of motion. Neck supple.  Cardiovascular: Regular rhythm, normal heart sounds and intact distal pulses.   Tachycardic,  heart rate 107  Pulmonary/Chest: Effort normal and breath sounds normal. No respiratory distress. She has no wheezes. She has no rales. She exhibits tenderness (Anterior inferior midsternal tenderness). She exhibits no mass, no laceration, no crepitus, no edema, no deformity, no swelling and no retraction.  Abdominal: Soft. Bowel sounds are normal. She exhibits no distension and no mass. There is tenderness (Epigastric tenderness). There is no rebound and no guarding.  Musculoskeletal: Normal range of motion. She exhibits no edema or tenderness.  Positive Tinel sign bilaterally. FROM of BUE at should, elbow, wrist and hand. Sensation grossly intact. 2+ radial pulse. Cap refill <2. Equal grip strength bilaterally. No swelling, erythema, warmth, abrasion or contusion noted.   Lymphadenopathy:    She has no cervical adenopathy.  Neurological: She is alert and oriented to person, place, and time. She has normal strength. No cranial nerve deficit or sensory deficit.  Coordination normal.  Skin: Skin is warm and dry.  Nursing note and vitals reviewed.   ED Course  .Critical Care Performed by: Nona Dell Authorized by: Nona Dell Total critical care time: 30 minutes Critical care was necessary to treat or prevent imminent or life-threatening deterioration of the following conditions: metabolic crisis (hypokalemia). Critical care was time spent personally by me on the following activities: blood draw for specimens, development of treatment plan with patient or surrogate, discussions with consultants, interpretation of cardiac output measurements, evaluation of patient's response to treatment, examination of patient, obtaining history from patient or surrogate, ordering and performing treatments and interventions, ordering and review of laboratory studies, ordering and review of radiographic studies, pulse oximetry, re-evaluation of patient's condition and review of old charts.   (including critical care time) Labs Review Labs Reviewed  BASIC METABOLIC PANEL - Abnormal; Notable for the following:    Potassium <2.0 (*)    Chloride 95 (*)    Glucose, Bld 114 (*)    BUN <5 (*)    Calcium 8.8 (*)    All other components within normal limits  CBC - Abnormal; Notable for the following:    Platelets 435 (*)    All other components within normal limits  HEPATIC FUNCTION PANEL - Abnormal; Notable for the following:    AST 103 (*)    Alkaline Phosphatase 131 (*)    Bilirubin, Direct <0.1 (*)    All other components within normal limits  D-DIMER, QUANTITATIVE (NOT AT [facility]) - Abnormal; Notable for the following:    D-Dimer, Quant 1.36 (*)    All other components within normal limits  TROPONIN I - Abnormal; Notable for the following:    Troponin I 0.09 (*)    All other components within normal limits  MAGNESIUM  ETHANOL  LIPASE, BLOOD  MAGNESIUM  CBC  COMPREHENSIVE METABOLIC PANEL  TSH  TROPONIN I  TROPONIN I  I-STAT  TROPOININ, ED    Imaging Review Dg Chest 2 View  06/06/2016  CLINICAL DATA:  Acute epigastric pain and shortness of breath for 3 days. EXAM: CHEST  2 VIEW COMPARISON:  09/14/2015 FINDINGS: The heart size and mediastinal contours are within normal limits. Both lungs are clear. The visualized skeletal structures are unremarkable. Minor aortic atherosclerosis noted. IMPRESSION: No acute chest process. Aortic atherosclerosis. Electronically Signed   By: Jerilynn Mages.  Shick M.D.   On: 06/06/2016 20:05   Ct Angio Chest Pe W/cm &/or Wo Cm  06/06/2016  CLINICAL DATA:  Shortness of breath, chest pain, tachycardia, nausea and vomiting for 1 week, worsened today. EXAM:  CT ANGIOGRAPHY CHEST WITH CONTRAST TECHNIQUE: Multidetector CT imaging of the chest was performed using the standard protocol during bolus administration of intravenous contrast. Multiplanar CT image reconstructions and MIPs were obtained to evaluate the vascular anatomy. CONTRAST:  100 cc Isovue 370. COMPARISON:  CT chest 11/28/2013. PA and lateral chest earlier today. FINDINGS: No pulmonary embolus is identified. Heart size is normal. A few atherosclerotic calcifications of the aorta are identified. No axillary, hilar or mediastinal lymphadenopathy. No pleural or pericardial effusion. The lungs demonstrate only mild dependent atelectatic change. Imaged upper abdomen appears normal. No bony abnormality is identified. Review of the MIP images confirms the above findings. IMPRESSION: Negative for pulmonary embolus.  No acute disease. Atherosclerosis. Electronically Signed   By: Inge Rise M.D.   On: 06/06/2016 20:52   I have personally reviewed and evaluated these images and lab results as part of my medical decision-making.   EKG Interpretation   Date/Time:  Monday June 06 2016 17:29:06 EDT Ventricular Rate:  111 PR Interval:  170 QRS Duration: 74 QT Interval:  362 QTC Calculation: 492 R Axis:   52 Text Interpretation:  Sinus tachycardia  Biatrial enlargement ST  depression, consider subendocardial injury or digitalis effect Nonspecific  T wave abnormality Abnormal ECG Prolonged QTc with inferolateral ST  depressions  Confirmed by LIU MD, Hinton Dyer [id number]) on 06/06/2016 7:08:43 PM      MDM   Final diagnoses:  Hypokalemia   Pt presents with CP, SOB, arm numbness and tingling, lightheadedness, N/V and abdominal pain. Hx of HTN, DM, HLD. On initial evaluation HR 107, pt appeared anxious on exam, remaining vitals stable. Exam revealed CP, epigastric pain and positive Tinel's. EKG showed sinus tachycardia with inferolateral ST depressions, nonspecific T wave changes and prolonged QTc. Pt was also noted to have a brief episode of bigeminy on the cardiac monitor. Troponin negative. CXR negative. K <2, pt given 10 IV potassium and 48mEq PO potassium. Magnesium 1.8. D-dimer 1.36, will order CT angio chest for evaluation of PE due to pt complaining of CP, SOB and being tachycardic on exam. CT angio negative. I suspect pt's sxs are likely due to hypokalemia. Plan to admit pt for further management. Consulted hospitalist. Dr. Tamala Julian agrees to admission. Discussed results and plan for admission with pt.     Chesley Noon [place], [place] 06/07/16 0033  Forde Dandy, MD 06/07/16 [phone]

## 2016-06-06 NOTE — ED Provider Notes (Signed)
Medical screening examination/treatment/procedure(s) were conducted as a shared visit with non-physician practitioner(s) and myself.  I personally evaluated the patient during the encounter.   EKG Interpretation   Date/Time:  Monday June 06 2016 17:29:06 EDT Ventricular Rate:  111 PR Interval:  170 QRS Duration: 74 QT Interval:  362 QTC Calculation: 492 R Axis:   52 Text Interpretation:  Sinus tachycardia Biatrial enlargement ST  depression, consider subendocardial injury or digitalis effect Nonspecific  T wave abnormality Abnormal ECG Prolonged QTc with inferolateral ST  depressions  Confirmed by Jovonne Wilton MD, Hinton Dyer [id number]) on 06/06/2016 7:08:43 PM      CRITICAL CARE Performed by: Forde Dandy   Total critical care time: 35 minutes  Critical care time was exclusive of separately billable procedures and treating other patients.  Critical care was necessary to treat or prevent imminent or life-threatening deterioration.  Critical care was time spent personally by me on the following activities: development of treatment plan with patient and/or surrogate as well as nursing, discussions with consultants, evaluation of patient's response to treatment, examination of patient, obtaining history from patient or surrogate, ordering and performing treatments and interventions, ordering and review of laboratory studies, ordering and review of radiographic studies, pulse oximetry and re-evaluation of patient's condition.   60 year old female with history of hypertension, diabetes, hyperlipidemia who presents with 2 days of chest pain, chest wall spasms, nausea, vomiting, tingling in her lateral hands, spasms in her extremities. She appears to be in significant distress with significant spasms and all of her extremities on presentation. Her vital signs are stable, and blood work primarily concerning for hypokalemia with potassium less than 2.0. I suspect that this is the likely etiology of her symptoms  today. She has normal magnesium. Is given 80 mEq of oral potassium and IV potassium 10 mEq. Is having some improvement in her symptoms after supplementation. EKG with tachycardia and some ST depressions in the inferolateral leads and has been noted to have a brief episode of bigeminy on the cardiac monitor. Her troponin is negative and I suspect that some of her EKG changes may also be related to her potassium. CT chest negative for PE or other acute intrathoracic processes. She will be admitted to hospitalist service for repletion.  Forde Dandy, MD 06/06/16 [phone]

## 2016-06-06 NOTE — Telephone Encounter (Signed)
[facility]  Patient Name: Erin [name]  DOB: September 17, 1956    Initial Comment Caller has severe pain in forearms and hands. Started 3 wks agp and getting worse.    Nurse Assessment  Nurse: Harlow Mares, RN, Suanne Marker Date/Time (Eastern Time): 06/06/2016 4:36:55 PM  Confirm and document reason for call. If symptomatic, describe symptoms. You must click the next button to save text entered. ---Caller has severe pain in forearms and hands. Started 3 wks ago and getting worse. Reports that she has pain when she straightens her hands out. It pulls like her hands need stretching out. Hurts from her elbows to her fingers. She is having loss of strength, she is having problems opening the refrig. Reports that she is using an ice pak and ibuprofen. Helps to numb the area for awhile, but it comes right back. During triage caller stated that she has been having problems with heart palpitations for the last few days as well, yesterday she felt like she was going to pass out and had to put her head between her knees to keep from doing so. She states that she feels some palpitations today and can feel her heart beating behind her eyes. She also states that she hasn't eaten today and has been in bed all day. Asked caller to sit up and then stand. Reports some SOB with exertion.  Has the patient traveled out of the country within the last 30 days? ---No  Does the patient have any new or worsening symptoms? ---Yes  Will a triage be completed? ---Yes  Related visit to physician within the last 2 weeks? ---No  Does the PT have any chronic conditions? (i.e. diabetes, asthma, etc.) ---Yes  List chronic conditions. ---hx of cancer of anus; borderline diabetic controlled by diet; hypothyroidism  Is this a behavioral health or substance abuse call? ---No     Guidelines    Guideline Title Affirmed Question Affirmed Notes  Heart Rate and Heartbeat Questions New or worsened shortness of  breath with activity (dyspnea on exertion)    Final Disposition User   Go to ED Now (or PCP triage) Harlow Mares, RN, [facility] - ED   Disagree/Comply: Comply

## 2016-06-06 NOTE — ED Notes (Addendum)
C/O weakness in the arms for 3 weeks with bilateral arm numbness. and chest pain that started yesterday, with Vomiting and dizziness that resulted in a syncopal event yesterday and was not seen because she was taking care of her mother. Pt also has some slurred speech in triage. Pt states she doesn't know how long her speech has not been normal for her.

## 2016-06-06 NOTE — H&P (Signed)
History and Physical    Erin [name] [id number] DOB: [birth year] DOA: 06/06/2016  Referring MD/NP/PA: Sophronia Simas PA-C PCP: [name], DO  Patient coming from: Home  Chief Complaint: Weakness  HPI: Erin [name] is a 60 y.o. female with medical history significant of anxiety/depression, hypokalemia, diet controlled diabetes mellitus, hypothyroidism, rectal cancer s/p radiation and chemotherapy; who presents with complaints of weakness. She notes that for the last 3 weeks she's had this numbness, tingling sensation, and bilateral arm weakness. However, yesterday symptoms worsened as she had 2 episodes of vomiting and reported passing out twice. Once that was while she had her head in between her knees while sitting in a chair and the second time was while she was on the bathroom on the toilet. The patient tried Zofran yesterday with relief of the nausea vomiting symptoms. She notes that she intermittently gets nauseous and has vomiting which she relates to her IBS. Recently, her Linzess was increased by her gastroenterologist which she notes has helped out a lot with her symptoms of constipation. Last bowel movement was yesterday. Associated symptoms included chest discomfort and reported palpitations. Patient states that she's had issues with low potassium since she was diagnosed with rectal cancer she is followed by Dr. Lisbeth Renshaw and Dr. Learta Codding. She reports previously requiring admission in which they had to give her over 11 bags of potassium chloride IV and magnesium back in 06/2015.  ED Course: Upon admission patient was evaluated and seen to be afebrile, heart rates up to 114, respirations up to 28, blood pressure up to 163/78, and O2 saturations maintain on room air. Lab work was significant for potassium less than 2, troponin of 0.06 i-STAT,  and all other lab work was relatively within normal limits. EKG showing signs of ST depression in inferior leads and prolonged QTC.  Review  of Systems: As per HPI otherwise 10 point review of systems negative.   Past Medical History  Diagnosis Date  . 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 Chronic daily headache 03/29/2013    takes bc powder  . IBS (irritable bowel syndrome) 03/29/2013  . Anxiety   . Depression   . HLD (hyperlipidemia)   . Allergy   . Blood transfusion without reported diagnosis   . Tubular adenoma of colon 09/08/2003  . Chronic back pain   . Hot flashes   . Alopecia   . 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 DM (diabetes mellitus) ([place])     diet controlled  . GERD (gastroesophageal reflux disease)   . Vertigo   . Hypothyroidism 09/10/2015  . Closed right hip fracture ([place]) 09/10/2015  . Hyperlipemia 04/11/2014  . B12 deficiency anemia 09/14/2015    Past Surgical History  Procedure Laterality Date  . Ectopic pregnancy surgery    . Pilonidal cyst excision    . Colonoscopy    . Flexible sigmoidoscopy N/A 08/14/2013    Procedure: FLEXIBLE SIGMOIDOSCOPY;  Surgeon: Ladene Artist, MD;  Location: [facility];  Service: Endoscopy;  Laterality: N/A;  . Polypectomy    . Tonsillectomy    . Total hip arthroplasty  09/11/2015    Procedure: TOTAL HIP ARTHROPLASTY;  Surgeon: Renette Butters, MD;  Location: [facility];  Service: Orthopedics;;     reports that she quit smoking about 20 months ago. Her smoking use included Cigarettes. She smoked 0.50 packs per day. She has never used smokeless tobacco.  She reports that she does not drink alcohol or use illicit drugs.  Allergies  Allergen Reactions  . Claritin [Loratadine] Other (See Comments)    Hot flashes  . Dexamethasone Itching    Itching  . Sulfa Antibiotics Other (See Comments)    GI distress-pain  . Atorvastatin Nausea And Vomiting  . Prednisone Rash    Family History  Problem Relation Age of Onset  . Arthritis Mother   . Hyperlipidemia Mother   . Heart disease Mother    . Hypertension Mother   . Stroke Mother 36  . Irritable bowel syndrome Mother   . Thyroid disease Mother   . Heart disease Father   . Hyperlipidemia Father   . Hypertension Father   . Stroke Father 49  . Thyroid disease Father   . Prostate cancer Father   . Lung cancer Brother   . Colon cancer Neg Hx   . Rectal cancer Neg Hx   . Stomach cancer Neg Hx   . Heart attack Mother   . Heart attack Father   . Heart attack Maternal Grandfather   . Heart attack Maternal Uncle     Prior to Admission medications   Medication Sig Start Date End Date Taking? Authorizing Provider  Biotin 10 MG TABS Take 10 mg by mouth daily. Reported on 05/30/2016    Historical Provider, MD  dicyclomine (BENTYL) 10 MG capsule Take 1 capsule (10 mg total) by mouth 3 (three) times daily before meals. 10/29/15   Ladene Artist, MD  ezetimibe (ZETIA) 10 MG tablet Take 1 tablet (10 mg total) by mouth daily. Patient not taking: Reported on 05/30/2016 04/08/16   Jettie Booze, MD  fluticasone [facility]) 50 MCG/ACT nasal spray Place 1 spray into both nostrils daily. 04/04/16   Lucretia Kern, DO  gabapentin (NEURONTIN) 600 MG tablet Take 1 tablet (600 mg total) by mouth 3 (three) times daily. 04/04/16   Lucretia Kern, DO  levothyroxine (SYNTHROID, LEVOTHROID) 75 MCG tablet Take 1 tablet (75 mcg total) by mouth daily. 04/04/16   Lucretia Kern, DO  linaclotide [facility]) 290 MCG CAPS capsule Take 1 capsule (290 mcg total) by mouth daily before breakfast. 05/17/16   Ladene Artist, MD  nortriptyline [facility]) 25 MG capsule Reported on 05/30/2016 04/06/15   Historical Provider, MD  omeprazole (PRILOSEC) 40 MG capsule Take 1 capsule (40 mg total) by mouth daily. 05/25/16   Ladene Artist, MD  ondansetron (ZOFRAN) 4 MG tablet Take 1 tablet (4 mg total) by mouth every 8 (eight) hours as needed for nausea or vomiting. 05/30/16   Ladell Pier, MD  oxymetazoline (AFRIN) 0.05 % nasal spray Place 1 spray into both nostrils daily as needed  for congestion.    Historical Provider, MD  PARoxetine (PAXIL) 20 MG tablet Take 1 tablet (20 mg total) by mouth daily. 04/04/16   Lucretia Kern, DO  vitamin B-12 1000 MCG tablet Take 1 tablet (1,000 mcg total) by mouth daily. 09/14/15   Nishant Dhungel, MD    Physical Exam:    Constitutional: NAD, calm, comfortable Filed Vitals:   06/06/16 1840 06/06/16 1845 06/06/16 1930 06/06/16 2100  BP: 162/94 102/73 136/76 93/78  Pulse: 95 53  87  Temp:      TempSrc:      Resp: 18 18 18 16   SpO2: 99% 100% 96% 100%   Eyes: PERRL, lids and conjunctivae normal ENMT: Mucous membranes are moist. Posterior pharynx clear of any exudate or lesions.Normal  dentition.  Neck: normal, supple, no masses, no thyromegaly Respiratory: clear to auscultation bilaterally, no wheezing, no crackles. Normal respiratory effort. No accessory muscle use.  Cardiovascular: Regular rate and rhythm, no murmurs / rubs / gallops. No extremity edema. 2+ pedal pulses. No carotid bruits.  Abdomen: no tenderness, no masses palpated. No hepatosplenomegaly. Bowel sounds positive.  Musculoskeletal: no clubbing / cyanosis. No joint deformity upper and lower extremities. Good ROM, no contractures. Normal muscle tone.  Skin: no rashes, lesions, ulcers. No induration Neurologic: CN 2-12 grossly intact. Sensation intact, DTR normal. Strength 5/5 in all 4.  Psychiatric: Normal judgment and insight. Alert and oriented x 3. Normal mood.     Labs on Admission: I have personally reviewed following labs and imaging studies  CBC:  Recent Labs Lab 06/06/16 1804  WBC 9.9  HGB 12.6  HCT 38.2  MCV 90.3  PLT [id number]*   Basic Metabolic Panel:  Recent Labs Lab 06/06/16 1804 06/06/16 1846  NA 141  --   K <2.0*  --   CL 95*  --   CO2 32  --   GLUCOSE 114*  --   BUN <5*  --   CREATININE 0.82  --   CALCIUM 8.8*  --   MG  --  1.8   GFR: Estimated Creatinine Clearance: 61.1 mL/min (by C-G formula based on Cr of 0.82). Liver Function  Tests:  Recent Labs Lab 06/06/16 1846  AST 103*  ALT 41  ALKPHOS 131*  BILITOT 0.5  PROT 6.9  ALBUMIN 3.5    Recent Labs Lab 06/06/16 1940  LIPASE 22   No results for input(s): AMMONIA in the last 168 hours. Coagulation Profile: No results for input(s): INR, PROTIME in the last 168 hours. Cardiac Enzymes: No results for input(s): CKTOTAL, CKMB, CKMBINDEX, TROPONINI in the last 168 hours. BNP (last 3 results) No results for input(s): PROBNP in the last 8760 hours. HbA1C: No results for input(s): HGBA1C in the last 72 hours. CBG: No results for input(s): GLUCAP in the last 168 hours. Lipid Profile: No results for input(s): CHOL, HDL, LDLCALC, TRIG, CHOLHDL, LDLDIRECT in the last 72 hours. Thyroid Function Tests: No results for input(s): TSH, T4TOTAL, FREET4, T3FREE, THYROIDAB in the last 72 hours. Anemia Panel: No results for input(s): VITAMINB12, FOLATE, FERRITIN, TIBC, IRON, RETICCTPCT in the last 72 hours. Urine analysis:    Component Value Date/Time   COLORURINE YELLOW 09/12/2015 [place] 09/12/2015 1225   LABSPEC 1.005 09/12/2015 1225   PHURINE 6.0 09/12/2015 1225   GLUCOSEU NEGATIVE 09/12/2015 1225   HGBUR NEGATIVE 09/12/2015 1225   BILIRUBINUR negative 03/18/2016 1049   BILIRUBINUR NEGATIVE 09/12/2015 1225   KETONESUR NEGATIVE 09/12/2015 1225   PROTEINUR negative 03/18/2016 1049   PROTEINUR NEGATIVE 09/12/2015 1225   UROBILINOGEN 0.2 03/18/2016 1049   UROBILINOGEN 0.2 09/12/2015 1225   NITRITE negative 03/18/2016 1049   NITRITE NEGATIVE 09/12/2015 1225   LEUKOCYTESUR Negative 03/18/2016 1049   Sepsis Labs: No results found for this or any previous visit (from the past 240 hour(s)).   Radiological Exams on Admission: Dg Chest 2 View  06/06/2016  CLINICAL DATA:  Acute epigastric pain and shortness of breath for 3 days. EXAM: CHEST  2 VIEW COMPARISON:  09/14/2015 FINDINGS: The heart size and mediastinal contours are within normal limits.  Both lungs are clear. The visualized skeletal structures are unremarkable. Minor aortic atherosclerosis noted. IMPRESSION: No acute chest process. Aortic atherosclerosis. Electronically Signed   By: Jerilynn Mages.  Shick M.D.  On: 06/06/2016 20:05   Ct Angio Chest Pe W/cm &/or Wo Cm  06/06/2016  CLINICAL DATA:  Shortness of breath, chest pain, tachycardia, nausea and vomiting for 1 week, worsened today. EXAM: CT ANGIOGRAPHY CHEST WITH CONTRAST TECHNIQUE: Multidetector CT imaging of the chest was performed using the standard protocol during bolus administration of intravenous contrast. Multiplanar CT image reconstructions and MIPs were obtained to evaluate the vascular anatomy. CONTRAST:  100 cc Isovue 370. COMPARISON:  CT chest 11/28/2013. PA and lateral chest earlier today. FINDINGS: No pulmonary embolus is identified. Heart size is normal. A few atherosclerotic calcifications of the aorta are identified. No axillary, hilar or mediastinal lymphadenopathy. No pleural or pericardial effusion. The lungs demonstrate only mild dependent atelectatic change. Imaged upper abdomen appears normal. No bony abnormality is identified. Review of the MIP images confirms the above findings. IMPRESSION: Negative for pulmonary embolus.  No acute disease. Atherosclerosis. Electronically Signed   By: Inge Rise M.D.   On: 06/06/2016 20:52    EKG: Independently reviewed. Sinus tachycardia with nonspecific T-wave abnormalities and signs of ST depression in the in ferior leads. Prolonged QTc 492.  Assessment/Plan Hypokalemia: Initial potassium was <2. Patient was given 80 mEq of potassium chloride orally and 10 mEq IV with in the ED. - Admit to telemetry bed - Give additional potassium chloride 40 mEq IV - Recheck in a.m. and replace as needed  Syncope and collapse. Patient reports having 2 episodes of syncope yesterday question if secondary to vasovagal versus arrhythmia given patient's significant hypokalemia. -  Follow-up  telemetry  Chest pain and palpitations : Initial troponin was 0.06 on i-STAT. EKG showing some signs of possible ischemia with ST depression seen in the inferior leads. -Trend cardiac troponins - Recheck EKG in a.m. - May warrant formal cardiology consultation if needed in a.m.  Prolonged QTc: Initial EKG shows a QTC of 492. - Recheck EKG in a.m.  Nausea and vomiting - Compazine prn N/V   Irritable bowel syndrome  - continue Linzess and bentyl   Anxiety/depression  - continue Paxil  Hypothyroidism - check TSH  - Continue levothyroxine  Elevated transaminases - may warrant further investigation  DVT prophylaxis: lovenox Code Status: Full Family Communication: Discussed overall plan with the patient and husband present at bedside Disposition Plan:  Possible discharge home in 2-3 days. Consults called: None  Admission status: Telemetry observation  Norval Morton MD [facility] Pager [phone]  If 7PM-7AM, please contact night-coverage [email or web address] Password [email or web address]  06/06/2016, 10:09 PM

## 2016-06-06 NOTE — ED Notes (Signed)
In room to medicate for spasm. Pt shaking head back and forth making "growling Noise". Pt can track with eyes and does respond yes/no head nods while making noise. Husband at bedside. Noise and movement resolve with administration of ativan. Pt clearly states "Iwent somewhere." Spasm at hands noted as resolved.Dr. Viviana Simpler made awaer of behavior.

## 2016-06-06 NOTE — ED Notes (Signed)
Pts Husband states hand spasm are becoming more intense. PA aware.

## 2016-06-06 NOTE — ED Notes (Signed)
Attempted to call report x 1  

## 2016-06-06 NOTE — ED Notes (Signed)
Pt c/o chest pain which she describes as spasmodic. Pt request her husband place pressure to chest. Pt also c/o spasm at right hand.Pt urged to slow breathing.

## 2016-06-07 ENCOUNTER — Encounter (HOSPITAL_COMMUNITY): Payer: Self-pay | Admitting: General Practice

## 2016-06-07 ENCOUNTER — Telehealth: Payer: Self-pay | Admitting: Oncology

## 2016-06-07 DIAGNOSIS — R55 Syncope and collapse: Secondary | ICD-10-CM

## 2016-06-07 DIAGNOSIS — E876 Hypokalemia: Secondary | ICD-10-CM | POA: Diagnosis not present

## 2016-06-07 DIAGNOSIS — K589 Irritable bowel syndrome without diarrhea: Secondary | ICD-10-CM | POA: Diagnosis not present

## 2016-06-07 DIAGNOSIS — R079 Chest pain, unspecified: Secondary | ICD-10-CM | POA: Diagnosis present

## 2016-06-07 DIAGNOSIS — E039 Hypothyroidism, unspecified: Secondary | ICD-10-CM | POA: Diagnosis not present

## 2016-06-07 HISTORY — DX: Syncope and collapse: R55

## 2016-06-07 LAB — CBC
HCT: 31.8 % — ABNORMAL LOW (ref 36.0–46.0)
Hemoglobin: 10 g/dL — ABNORMAL LOW (ref 12.0–15.0)
MCH: 28.7 pg (ref 26.0–34.0)
MCHC: 31.4 g/dL (ref 30.0–36.0)
MCV: 91.4 fL (ref 78.0–100.0)
PLATELETS: 340 10*3/uL (ref 150–400)
RBC: 3.48 MIL/uL — ABNORMAL LOW (ref 3.87–5.11)
RDW: 15 % (ref 11.5–15.5)
WBC: 4.5 10*3/uL (ref 4.0–10.5)

## 2016-06-07 LAB — URINALYSIS, ROUTINE W REFLEX MICROSCOPIC
Bilirubin Urine: NEGATIVE
Glucose, UA: NEGATIVE mg/dL
KETONES UR: NEGATIVE mg/dL
LEUKOCYTES UA: NEGATIVE
NITRITE: NEGATIVE
PROTEIN: NEGATIVE mg/dL
Specific Gravity, Urine: 1.009 (ref 1.005–1.030)
pH: 6.5 (ref 5.0–8.0)

## 2016-06-07 LAB — URINE MICROSCOPIC-ADD ON

## 2016-06-07 LAB — MAGNESIUM: Magnesium: 1.7 mg/dL (ref 1.7–2.4)

## 2016-06-07 LAB — COMPREHENSIVE METABOLIC PANEL
ALT: 29 U/L (ref 14–54)
ANION GAP: 9 (ref 5–15)
AST: 71 U/L — ABNORMAL HIGH (ref 15–41)
Albumin: 2.4 g/dL — ABNORMAL LOW (ref 3.5–5.0)
Alkaline Phosphatase: 95 U/L (ref 38–126)
BUN: <5 mg/dL — ABNORMAL LOW (ref 6–20)
CHLORIDE: 106 mmol/L (ref 101–111)
CO2: 31 mmol/L (ref 22–32)
Calcium: 7.1 mg/dL — ABNORMAL LOW (ref 8.9–10.3)
Creatinine, Ser: 0.58 mg/dL (ref 0.44–1.00)
GFR calc non Af Amer: >60 mL/min (ref >60)
Glucose, Bld: 89 mg/dL (ref 65–99)
Potassium: 2.3 mmol/L — CL (ref 3.5–5.1)
SODIUM: 146 mmol/L — AB (ref 135–145)
Total Bilirubin: 0.4 mg/dL (ref 0.3–1.2)
Total Protein: 4.8 g/dL — ABNORMAL LOW (ref 6.5–8.1)

## 2016-06-07 LAB — TROPONIN I
TROPONIN I: 0.05 ng/mL — AB (ref <0.03)
Troponin I: 0.1 ng/mL (ref <0.03)

## 2016-06-07 LAB — POTASSIUM: POTASSIUM: 2.7 mmol/L — AB (ref 3.5–5.1)

## 2016-06-07 MED ORDER — HYDRALAZINE HCL 20 MG/ML IJ SOLN
10.0000 mg | Freq: Once | INTRAMUSCULAR | Status: AC
  Administered 2016-06-07: 10 mg via INTRAVENOUS
  Filled 2016-06-07: qty 1

## 2016-06-07 MED ORDER — POTASSIUM CHLORIDE 10 MEQ/100ML IV SOLN
10.0000 meq | INTRAVENOUS | Status: AC
  Administered 2016-06-07 (×2): 10 meq via INTRAVENOUS
  Filled 2016-06-07 (×2): qty 100

## 2016-06-07 MED ORDER — IBUPROFEN 200 MG PO TABS
400.0000 mg | ORAL_TABLET | Freq: Four times a day (QID) | ORAL | Status: DC | PRN
Start: 2016-06-07 — End: 2016-06-08
  Administered 2016-06-07 – 2016-06-08 (×3): 400 mg via ORAL
  Filled 2016-06-07 (×5): qty 2

## 2016-06-07 MED ORDER — LORAZEPAM 2 MG/ML IJ SOLN
0.5000 mg | Freq: Once | INTRAMUSCULAR | Status: AC
  Administered 2016-06-07: 0.5 mg via INTRAVENOUS

## 2016-06-07 MED ORDER — LORAZEPAM 2 MG/ML IJ SOLN
INTRAMUSCULAR | Status: AC
  Filled 2016-06-07: qty 1

## 2016-06-07 MED ORDER — POTASSIUM CHLORIDE 10 MEQ/100ML IV SOLN
10.0000 meq | INTRAVENOUS | Status: AC
  Administered 2016-06-07 (×6): 10 meq via INTRAVENOUS
  Filled 2016-06-07 (×6): qty 100

## 2016-06-07 MED ORDER — POTASSIUM CHLORIDE CRYS ER 20 MEQ PO TBCR
40.0000 meq | EXTENDED_RELEASE_TABLET | ORAL | Status: AC
  Administered 2016-06-07 (×3): 40 meq via ORAL
  Filled 2016-06-07 (×3): qty 2

## 2016-06-07 NOTE — Progress Notes (Signed)
CRITICAL VALUE ALERT  Critical value received:  Potassium 2.7  Date of notification:  06/07/16  Time of notification:  1628  Critical value read back:Yes.    Nurse who received alert:  S. Chrisandra Carota  MD notified (1st page):  [place]  Time of first page:  1632  MD notified (2nd page):  Time of second page:  Responding MD:  Cruzita Lederer  Time MD responded:  [phone]

## 2016-06-07 NOTE — Telephone Encounter (Signed)
Called patient to confirm appointment for 07/14/16. Left voice mail. Appointment letter and schedule mailed. Merleen Nicely.

## 2016-06-07 NOTE — Progress Notes (Addendum)
PROGRESS NOTE  Erin [name] [id number] DOB: 1956-10-08 DOA: 06/06/2016 PCP: [name], DO     Brief Narrative: 60 y.o. female with medical history significant of anxiety/depression, hypokalemia, diet controlled diabetes mellitus, hypothyroidism, rectal cancer s/p radiation and chemotherapy; who presents with complaints of weakness. She notes that for the last 3 weeks she's had this numbness, tingling sensation, and bilateral arm weakness. She was found to have profound hypokalemia. Also reports 2 syncopal episodes at home.   Assessment & Plan: Principal Problem:   Hypokalemia Active Problems:   IBS (irritable bowel syndrome)   Anal cancer ([facility])   Nausea & vomiting   Hypothyroidism   Syncope   Chest pain   Elevated transaminase level    Hypokalemia - patient has been having acute on chronic hypokalemia and no causes were identified in the outpatient setting. She is not taking diuretics, no significant GI looses per history.  - repeat K pending 2 pm - check UA for urinary ph, check urinary K - it appears that she was worked up for primary aldo Oct 2016, negative.   Syncope and collapse - Patient reports having 2 episodes of syncope yesterday  - I am very concerned about arrhythmias given elevated troponin -Follow-up telemetry, 2D echo. Consult cardiology if any abnormalities.  Chest pain and palpitations - again concern for arrhythmia given hypokalemia - Trend cardiac troponins, pattern not c/w ACS, last troponin pending - check 2D echo  Nausea and vomiting - Compazine prn N/V  Irritable bowel syndrome  - continue Linzess and bentyl   Anxiety/depression  - continue Paxil  Hypothyroidism - check TSH , normal - Continue levothyroxine  Elevated transaminases - will repeat in am, asymptomatic   DVT prophylaxis: lovenox Code Status: Full Family Communication: no family bedside Disposition Plan: home when ready, likely 1 day if K  improved  Consultants:   None   Procedures:   2D echo: pending  Antimicrobials:  None    Subjective: - no chest pain, shortness of breath, no abdominal pain, nausea or vomiting.  - feeling much better this morning, weakness has improved  Objective: Filed Vitals:   06/06/16 2215 06/07/16 0001 06/07/16 0531 06/07/16 0746  BP: 150/88 156/93 129/56 151/73  Pulse: 91 90 76 77  Temp:  98.1 F (36.7 C) 97.7 F (36.5 C) 98.2 F (36.8 C)  TempSrc:  Oral Oral Oral  Resp: 20 18 18 18   Height:  5\' 3"  (1.6 m)    Weight:  54.114 kg (119 lb 4.8 oz)    SpO2: 97% 100% 100% 100%    Intake/Output Summary (Last 24 hours) at 06/07/16 1020 Last data filed at 06/07/16 0958  Gross per 24 hour  Intake 1246.25 ml  Output   1550 ml  Net -303.75 ml   Filed Weights   06/07/16 0001  Weight: 54.114 kg (119 lb 4.8 oz)    Examination: Constitutional: NAD Filed Vitals:   06/06/16 2215 06/07/16 0001 06/07/16 0531 06/07/16 0746  BP: 150/88 156/93 129/56 151/73  Pulse: 91 90 76 77  Temp:  98.1 F (36.7 C) 97.7 F (36.5 C) 98.2 F (36.8 C)  TempSrc:  Oral Oral Oral  Resp: 20 18 18 18   Height:  5\' 3"  (1.6 m)    Weight:  54.114 kg (119 lb 4.8 oz)    SpO2: 97% 100% 100% 100%   Eyes: PERRL, lids and conjunctivae normal Respiratory: clear to auscultation bilaterally, no wheezing, no crackles. Normal respiratory effort. No accessory muscle use.  Cardiovascular: Regular rate and rhythm, no murmurs / rubs / gallops. No LE edema. 2+ pedal pulses. No carotid bruits.  Abdomen: no tenderness. Bowel sounds positive.  Musculoskeletal: no clubbing / cyanosis.   Neurologic: non focal  Psychiatric: Normal judgment and insight. Alert and oriented x 3. Normal mood.    Data Reviewed: I have personally reviewed following labs and imaging studies  CBC:  Recent Labs Lab 06/06/16 1804 06/07/16 0510  WBC 9.9 4.5  HGB 12.6 10.0*  HCT 38.2 31.8*  MCV 90.3 91.4  PLT 435* [id number]   Basic Metabolic  Panel:  Recent Labs Lab 06/06/16 1804 06/06/16 1846 06/07/16 0510  NA 141  --  146*  K <2.0*  --  2.3*  CL 95*  --  106  CO2 32  --  31  GLUCOSE 114*  --  89  BUN <5*  --  <5*  CREATININE 0.82  --  0.58  CALCIUM 8.8*  --  7.1*  MG  --  1.8 1.7   GFR: Estimated Creatinine Clearance: 62.6 mL/min (by C-G formula based on Cr of 0.58). Liver Function Tests:  Recent Labs Lab 06/06/16 1846 06/07/16 0510  AST 103* 71*  ALT 41 29  ALKPHOS 131* 95  BILITOT 0.5 0.4  PROT 6.9 4.8*  ALBUMIN 3.5 2.4*    Recent Labs Lab 06/06/16 1940  LIPASE 22   No results for input(s): AMMONIA in the last 168 hours. Coagulation Profile: No results for input(s): INR, PROTIME in the last 168 hours. Cardiac Enzymes:  Recent Labs Lab 06/06/16 2247 06/07/16 0510  TROPONINI 0.09* 0.10*   BNP (last 3 results) No results for input(s): PROBNP in the last 8760 hours. HbA1C: No results for input(s): HGBA1C in the last 72 hours. CBG: No results for input(s): GLUCAP in the last 168 hours. Lipid Profile: No results for input(s): CHOL, HDL, LDLCALC, TRIG, CHOLHDL, LDLDIRECT in the last 72 hours. Thyroid Function Tests:  Recent Labs  06/06/16 2247  TSH 2.285   Anemia Panel: No results for input(s): VITAMINB12, FOLATE, FERRITIN, TIBC, IRON, RETICCTPCT in the last 72 hours. Urine analysis:    Component Value Date/Time   COLORURINE YELLOW 09/12/2015 [place] 09/12/2015 1225   LABSPEC 1.005 09/12/2015 1225   PHURINE 6.0 09/12/2015 1225   GLUCOSEU NEGATIVE 09/12/2015 1225   HGBUR NEGATIVE 09/12/2015 1225   BILIRUBINUR negative 03/18/2016 1049   BILIRUBINUR NEGATIVE 09/12/2015 1225   KETONESUR NEGATIVE 09/12/2015 1225   PROTEINUR negative 03/18/2016 1049   PROTEINUR NEGATIVE 09/12/2015 1225   UROBILINOGEN 0.2 03/18/2016 1049   UROBILINOGEN 0.2 09/12/2015 1225   NITRITE negative 03/18/2016 1049   NITRITE NEGATIVE 09/12/2015 1225   LEUKOCYTESUR Negative 03/18/2016 1049    Sepsis Labs: Invalid input(s): PROCALCITONIN, LACTICIDVEN  No results found for this or any previous visit (from the past 240 hour(s)).    Radiology Studies: Dg Chest 2 View  06/06/2016  CLINICAL DATA:  Acute epigastric pain and shortness of breath for 3 days. EXAM: CHEST  2 VIEW COMPARISON:  09/14/2015 FINDINGS: The heart size and mediastinal contours are within normal limits. Both lungs are clear. The visualized skeletal structures are unremarkable. Minor aortic atherosclerosis noted. IMPRESSION: No acute chest process. Aortic atherosclerosis. Electronically Signed   By: Jerilynn Mages.  Shick M.D.   On: 06/06/2016 20:05   Ct Angio Chest Pe W/cm &/or Wo Cm  06/06/2016  CLINICAL DATA:  Shortness of breath, chest pain, tachycardia, nausea and vomiting for 1 week, worsened today. EXAM: CT  ANGIOGRAPHY CHEST WITH CONTRAST TECHNIQUE: Multidetector CT imaging of the chest was performed using the standard protocol during bolus administration of intravenous contrast. Multiplanar CT image reconstructions and MIPs were obtained to evaluate the vascular anatomy. CONTRAST:  100 cc Isovue 370. COMPARISON:  CT chest 11/28/2013. PA and lateral chest earlier today. FINDINGS: No pulmonary embolus is identified. Heart size is normal. A few atherosclerotic calcifications of the aorta are identified. No axillary, hilar or mediastinal lymphadenopathy. No pleural or pericardial effusion. The lungs demonstrate only mild dependent atelectatic change. Imaged upper abdomen appears normal. No bony abnormality is identified. Review of the MIP images confirms the above findings. IMPRESSION: Negative for pulmonary embolus.  No acute disease. Atherosclerosis. Electronically Signed   By: Inge Rise M.D.   On: 06/06/2016 20:52     Scheduled Meds: . dicyclomine  10 mg Oral TID AC  . enoxaparin (LOVENOX) injection  40 mg Subcutaneous QHS  . fluticasone  1 spray Each Nare Daily  . gabapentin  600 mg Oral TID  . levothyroxine  75 mcg  Oral QAC breakfast  . linaclotide  290 mcg Oral QAC breakfast  . pantoprazole  40 mg Oral BID  . PARoxetine  20 mg Oral Daily  . potassium chloride  10 mEq Intravenous Q1 Hr x 6  . sodium chloride flush  3 mL Intravenous Q12H   Continuous Infusions: . sodium chloride 75 mL/hr at 06/07/16 0026      Marzetta Board, MD, PhD [facility] Pager [phone]  If 7PM-7AM, please contact night-coverage [email or web address] Password [email or web address] 06/07/2016, 10:20 AM

## 2016-06-07 NOTE — Progress Notes (Signed)
Pt was complaining of numbness on both arms and legs, tingling sensation on both hands, " I feel  a stabbing pain in my face"  per pt, denies headache, denies nausea and vomiting. K. Schorr was notified and order one time dose Ativan IV. Will continue to monitor pt.

## 2016-06-08 ENCOUNTER — Observation Stay (HOSPITAL_BASED_OUTPATIENT_CLINIC_OR_DEPARTMENT_OTHER): Payer: BLUE CROSS/BLUE SHIELD

## 2016-06-08 DIAGNOSIS — R079 Chest pain, unspecified: Secondary | ICD-10-CM | POA: Diagnosis not present

## 2016-06-08 DIAGNOSIS — E876 Hypokalemia: Secondary | ICD-10-CM | POA: Diagnosis not present

## 2016-06-08 LAB — CBC
HEMATOCRIT: 33 % — AB (ref 36.0–46.0)
HEMOGLOBIN: 10.4 g/dL — AB (ref 12.0–15.0)
MCH: 29.1 pg (ref 26.0–34.0)
MCHC: 31.5 g/dL (ref 30.0–36.0)
MCV: 92.2 fL (ref 78.0–100.0)
Platelets: 315 10*3/uL (ref 150–400)
RBC: 3.58 MIL/uL — AB (ref 3.87–5.11)
RDW: 14.9 % (ref 11.5–15.5)
WBC: 5.1 10*3/uL (ref 4.0–10.5)

## 2016-06-08 LAB — COMPREHENSIVE METABOLIC PANEL
ALBUMIN: 2.7 g/dL — AB (ref 3.5–5.0)
ALK PHOS: 109 U/L (ref 38–126)
ALT: 35 U/L (ref 14–54)
ANION GAP: 10 (ref 5–15)
AST: 81 U/L — ABNORMAL HIGH (ref 15–41)
BILIRUBIN TOTAL: 0.5 mg/dL (ref 0.3–1.2)
BUN: <5 mg/dL — ABNORMAL LOW (ref 6–20)
CALCIUM: 7.2 mg/dL — AB (ref 8.9–10.3)
CO2: 28 mmol/L (ref 22–32)
Chloride: 106 mmol/L (ref 101–111)
Creatinine, Ser: 0.46 mg/dL (ref 0.44–1.00)
GFR calc non Af Amer: >60 mL/min (ref >60)
GLUCOSE: 88 mg/dL (ref 65–99)
POTASSIUM: 3 mmol/L — AB (ref 3.5–5.1)
SODIUM: 144 mmol/L (ref 135–145)
TOTAL PROTEIN: 5.4 g/dL — AB (ref 6.5–8.1)

## 2016-06-08 LAB — BASIC METABOLIC PANEL
ANION GAP: 6 (ref 5–15)
BUN: <5 mg/dL — ABNORMAL LOW (ref 6–20)
CHLORIDE: 110 mmol/L (ref 101–111)
CO2: 26 mmol/L (ref 22–32)
Calcium: 7.5 mg/dL — ABNORMAL LOW (ref 8.9–10.3)
Creatinine, Ser: 0.53 mg/dL (ref 0.44–1.00)
Glucose, Bld: 96 mg/dL (ref 65–99)
POTASSIUM: 3.4 mmol/L — AB (ref 3.5–5.1)
SODIUM: 142 mmol/L (ref 135–145)

## 2016-06-08 LAB — ECHOCARDIOGRAM COMPLETE
HEIGHTINCHES: 63 in
WEIGHTICAEL: 1873.6 [oz_av]

## 2016-06-08 LAB — POTASSIUM: Potassium: 2.3 mmol/L — CL (ref 3.5–5.1)

## 2016-06-08 LAB — TROPONIN I: TROPONIN I: 0.03 ng/mL — AB (ref <0.03)

## 2016-06-08 MED ORDER — POTASSIUM CHLORIDE 20 MEQ/15ML (10%) PO SOLN
40.0000 meq | Freq: Once | ORAL | Status: DC
Start: 2016-06-08 — End: 2016-06-08
  Filled 2016-06-08: qty 30

## 2016-06-08 MED ORDER — LINACLOTIDE 290 MCG PO CAPS: 290.0000 ug | ORAL_CAPSULE | Freq: Every day | ORAL | Status: DC

## 2016-06-08 MED ORDER — OXYCODONE-ACETAMINOPHEN 5-325 MG PO TABS
1.0000 | ORAL_TABLET | Freq: Four times a day (QID) | ORAL | Status: DC | PRN
  Administered 2016-06-08: 1 via ORAL
  Filled 2016-06-08: qty 1

## 2016-06-08 MED ORDER — POTASSIUM CHLORIDE CRYS ER 20 MEQ PO TBCR
40.0000 meq | EXTENDED_RELEASE_TABLET | Freq: Once | ORAL | Status: AC
  Administered 2016-06-08: 40 meq via ORAL
  Filled 2016-06-08: qty 2

## 2016-06-08 MED ORDER — POTASSIUM CHLORIDE ER 20 MEQ PO TBCR: 20.0000 meq | EXTENDED_RELEASE_TABLET | Freq: Every day | ORAL | Status: DC

## 2016-06-08 MED ORDER — POTASSIUM CHLORIDE 20 MEQ/15ML (10%) PO SOLN
40.0000 meq | Freq: Once | ORAL | Status: AC
  Administered 2016-06-08: 40 meq via ORAL

## 2016-06-08 MED ORDER — POTASSIUM CHLORIDE 10 MEQ/100ML IV SOLN
10.0000 meq | INTRAVENOUS | Status: AC
  Administered 2016-06-08 (×5): 10 meq via INTRAVENOUS
  Filled 2016-06-08 (×2): qty 100

## 2016-06-08 NOTE — Progress Notes (Signed)
Patient given discharge instructions and all questions answered.  Patient discharged via wheelchair with all belongings.   

## 2016-06-08 NOTE — Progress Notes (Signed)
  Echocardiogram 2D Echocardiogram has been performed.  Jennette Dubin 06/08/2016, 2:36 PM

## 2016-06-08 NOTE — Discharge Summary (Signed)
Physician Discharge Summary  Erin [name] [id number] DOB: [birth year] DOA: 06/06/2016  PCP: Colin Benton R., DO  Admit date: 06/06/2016 Discharge date: 06/08/2016  Recommendations for Outpatient Follow-up:  1. Pt will need to follow up with PCP in 1-2 weeks post discharge 2. Please obtain BMP to evaluate electrolytes and kidney function 3. Please also check CBC to evaluate Hg and Hct levels 4. Please note that pt was advised to report any additional syncopal events to PCP< may need cardio referral   Discharge Diagnoses:  Principal Problem:   Hypokalemia Active Problems:   IBS (irritable bowel syndrome)   Anal cancer (HCC)   Nausea & vomiting   Hypothyroidism   Syncope   Chest pain   Elevated transaminase level   Discharge Condition: Stable  Diet recommendation: Heart healthy diet discussed in details   History of present illness:   Brief Narrative: 60 y.o. female with medical history significant of anxiety/depression, hypokalemia, diet controlled diabetes mellitus, hypothyroidism, rectal cancer s/p radiation and chemotherapy; who presents with complaints of weakness. She notes that for the last 3 weeks she's had this numbness, tingling sensation, and bilateral arm weakness. She was found to have profound hypokalemia. Also reports 2 syncopal episodes at home.   Assessment & Plan:  Hypokalemia - patient has been having acute on chronic hypokalemia and no causes were identified in the outpatient setting. She is not taking diuretics, no significant GI looses per history.  - K has been supplemented, 3.4 today - provided script for K-dur   Syncope and collapse - Patient reports having 2 episodes of syncope yesterday  - ECHo with normal Ef and grade I diastolic CHF (please note no signs of volume overload on exam and pt reports no known hx of CHF)  Chest pain and palpitationslevothyroxine  - again concern for arrhythmia given hypokalemia - Trend cardiac troponins,  pattern not c/w ACS, last troponin down to 0.03 - ECHO with normal EF   Nausea and vomiting - resolved   Irritable bowel syndrome  - continue Linzess and bentyl   Anxiety/depression  - continue Paxil  Hypothyroidism - TSH normal  Elevated transaminases - asymptomatic    DVT prophylaxis: lovenox Code Status: Full Family Communication: no family bedside Disposition Plan: home   Consultants:   None  Procedures:   2D echo  Antimicrobials:  None  Procedures/Studies: Dg Chest 2 View  06/06/2016  CLINICAL DATA:  Acute epigastric pain and shortness of breath for 3 days. EXAM: CHEST  2 VIEW COMPARISON:  09/14/2015 FINDINGS: The heart size and mediastinal contours are within normal limits. Both lungs are clear. The visualized skeletal structures are unremarkable. Minor aortic atherosclerosis noted. IMPRESSION: No acute chest process. Aortic atherosclerosis. Electronically Signed   By: Jerilynn Mages.  Shick M.D.   On: 06/06/2016 20:05   Ct Angio Chest Pe W/cm &/or Wo Cm  06/06/2016  CLINICAL DATA:  Shortness of breath, chest pain, tachycardia, nausea and vomiting for 1 week, worsened today. EXAM: CT ANGIOGRAPHY CHEST WITH CONTRAST TECHNIQUE: Multidetector CT imaging of the chest was performed using the standard protocol during bolus administration of intravenous contrast. Multiplanar CT image reconstructions and MIPs were obtained to evaluate the vascular anatomy. CONTRAST:  100 cc Isovue 370. COMPARISON:  CT chest 11/28/2013. PA and lateral chest earlier today. FINDINGS: No pulmonary embolus is identified. Heart size is normal. A few atherosclerotic calcifications of the aorta are identified. No axillary, hilar or mediastinal lymphadenopathy. No pleural or pericardial effusion. The lungs demonstrate only mild dependent atelectatic  change. Imaged upper abdomen appears normal. No bony abnormality is identified. Review of the MIP images confirms the above findings. IMPRESSION: Negative for  pulmonary embolus.  No acute disease. Atherosclerosis. Electronically Signed   By: Inge Rise M.D.   On: 06/06/2016 20:52    Discharge Exam: Filed Vitals:   06/08/16 0115 06/08/16 0436  BP: 140/60 126/64  Pulse: 102 78  Temp: 98.5 F (36.9 C) 98 F (36.7 C)  Resp: 20 17   Filed Vitals:   06/07/16 2126 06/07/16 2221 06/08/16 0115 06/08/16 0436  BP: 171/69 177/89 140/60 126/64  Pulse:  123 102 78  Temp:  98.2 F (36.8 C) 98.5 F (36.9 C) 98 F (36.7 C)  TempSrc:  Oral Oral Oral  Resp:  22 20 17   Height:      Weight:    53.116 kg (117 lb 1.6 oz)  SpO2:  96% 95% 99%    General: Pt is alert, follows commands appropriately, not in acute distress Cardiovascular: Regular rate and rhythm, no rubs, no gallops Respiratory: Clear to auscultation bilaterally, no wheezing, no crackles, no rhonchi Abdominal: Soft, non tender, non distended, bowel sounds +, no guarding Extremities: no edema, no cyanosis, pulses palpable bilaterally DP and PT  Discharge Instructions     Medication List    TAKE these medications        cyanocobalamin 1000 MCG tablet  Take 1 tablet (1,000 mcg total) by mouth daily.     dicyclomine 10 MG capsule  Commonly known as:  BENTYL  Take 1 capsule (10 mg total) by mouth 3 (three) times daily before meals.     fluticasone 50 MCG/ACT nasal spray  Commonly known as:  FLONASE  Place 1 spray into both nostrils daily.     gabapentin 600 MG tablet  Commonly known as:  NEURONTIN  Take 1 tablet (600 mg total) by mouth 3 (three) times daily.     levothyroxine 75 MCG tablet  Commonly known as:  SYNTHROID, LEVOTHROID  Take 1 tablet (75 mcg total) by mouth daily.     linaclotide 290 MCG Caps capsule  Commonly known as:  LINZESS  Take 1 capsule (290 mcg total) by mouth daily before breakfast.     omeprazole 40 MG capsule  Commonly known as:  PRILOSEC  Take 1 capsule (40 mg total) by mouth daily.     ondansetron 4 MG tablet  Commonly known as:  ZOFRAN   Take 1 tablet (4 mg total) by mouth every 8 (eight) hours as needed for nausea or vomiting.     PARoxetine 20 MG tablet  Commonly known as:  PAXIL  Take 1 tablet (20 mg total) by mouth daily.           Follow-up Information    Follow up with Colin Benton R., DO.   Specialty:  Family Medicine   Contact information:   [place] [address] [phone]        The results of significant diagnostics from this hospitalization (including imaging, microbiology, ancillary and laboratory) are listed below for reference.     Microbiology: No results found for this or any previous visit (from the past 240 hour(s)).   Labs: Basic Metabolic Panel:  Recent Labs Lab 06/06/16 1804 06/06/16 1846 06/07/16 0510 06/07/16 1628 06/07/16 2303 06/08/16 0323  NA 141  --  146*  --   --  144  K <2.0*  --  2.3* 2.7* 2.3* 3.0*  CL 95*  --  106  --   --  106  CO2 32  --  31  --   --  28  GLUCOSE 114*  --  89  --   --  88  BUN <5*  --  <5*  --   --  <5*  CREATININE 0.82  --  0.58  --   --  0.46  CALCIUM 8.8*  --  7.1*  --   --  7.2*  MG  --  1.8 1.7  --   --   --    Liver Function Tests:  Recent Labs Lab 06/06/16 1846 06/07/16 0510 06/08/16 0323  AST 103* 71* 81*  ALT 41 29 35  ALKPHOS 131* 95 109  BILITOT 0.5 0.4 0.5  PROT 6.9 4.8* 5.4*  ALBUMIN 3.5 2.4* 2.7*    Recent Labs Lab 06/06/16 1940  LIPASE 22   No results for input(s): AMMONIA in the last 168 hours. CBC:  Recent Labs Lab 06/06/16 1804 06/07/16 0510 06/08/16 0323  WBC 9.9 4.5 5.1  HGB 12.6 10.0* 10.4*  HCT 38.2 31.8* 33.0*  MCV 90.3 91.4 92.2  PLT 435* 340 315   Cardiac Enzymes:  Recent Labs Lab 06/06/16 2247 06/07/16 0510 06/07/16 1321  TROPONINI 0.09* 0.10* 0.05*   BNP: BNP (last 3 results) No results for input(s): BNP in the last 8760 hours.  ProBNP (last 3 results) No results for input(s): PROBNP in the last 8760 hours.  CBG: No results for input(s): GLUCAP in  the last 168 hours.   SIGNED: Time coordinating discharge: Over 30 minutes  Faye Ramsay, MD  [facility] 06/08/2016, 11:21 AM Pager [phone]  If 7PM-7AM, please contact night-coverage [email or web address] Password [email or web address]

## 2016-06-08 NOTE — Discharge Instructions (Signed)

## 2016-06-08 NOTE — Progress Notes (Signed)
CRITICAL VALUE ALERT  Critical value received:  K 2.3  Date of notification:  06/08/2016  Time of notification:  12.05 Am  Critical value read back:yes  Nurse who received alert:  Deric Bocock  Md notified, K supplements IV and PO is in progress.

## 2016-06-10 ENCOUNTER — Other Ambulatory Visit: Payer: BLUE CROSS/BLUE SHIELD

## 2016-06-13 ENCOUNTER — Ambulatory Visit: Payer: BLUE CROSS/BLUE SHIELD | Admitting: Oncology

## 2016-06-14 ENCOUNTER — Telehealth: Payer: Self-pay | Admitting: Internal Medicine

## 2016-06-14 NOTE — Telephone Encounter (Signed)
Pt called and wanted to let you know how much she appreciates and thanks you for everything you have done for her.  "I would hug his neck if i was there" Pt stated this is the best she has felt in years

## 2016-06-17 ENCOUNTER — Ambulatory Visit (INDEPENDENT_AMBULATORY_CARE_PROVIDER_SITE_OTHER): Payer: BLUE CROSS/BLUE SHIELD | Admitting: Family Medicine

## 2016-06-17 ENCOUNTER — Encounter: Payer: Self-pay | Admitting: Family Medicine

## 2016-06-17 ENCOUNTER — Ambulatory Visit: Payer: BLUE CROSS/BLUE SHIELD | Admitting: Family Medicine

## 2016-06-17 VITALS — BP 136/88 | HR 90 | Temp 98.4°F | Ht 63.0 in | Wt 116.3 lb

## 2016-06-17 DIAGNOSIS — R7401 Elevation of levels of liver transaminase levels: Secondary | ICD-10-CM

## 2016-06-17 DIAGNOSIS — E876 Hypokalemia: Secondary | ICD-10-CM

## 2016-06-17 DIAGNOSIS — R740 Nonspecific elevation of levels of transaminase and lactic acid dehydrogenase [LDH]: Secondary | ICD-10-CM | POA: Diagnosis not present

## 2016-06-17 DIAGNOSIS — E039 Hypothyroidism, unspecified: Secondary | ICD-10-CM

## 2016-06-17 DIAGNOSIS — R51 Headache: Secondary | ICD-10-CM

## 2016-06-17 DIAGNOSIS — K219 Gastro-esophageal reflux disease without esophagitis: Secondary | ICD-10-CM

## 2016-06-17 DIAGNOSIS — R519 Headache, unspecified: Secondary | ICD-10-CM

## 2016-06-17 DIAGNOSIS — D519 Vitamin B12 deficiency anemia, unspecified: Secondary | ICD-10-CM

## 2016-06-17 DIAGNOSIS — R899 Unspecified abnormal finding in specimens from other organs, systems and tissues: Secondary | ICD-10-CM

## 2016-06-17 DIAGNOSIS — E119 Type 2 diabetes mellitus without complications: Secondary | ICD-10-CM | POA: Diagnosis not present

## 2016-06-17 LAB — BASIC METABOLIC PANEL
BUN: 7 mg/dL (ref 6–23)
CALCIUM: 9.6 mg/dL (ref 8.4–10.5)
CHLORIDE: 103 meq/L (ref 96–112)
CO2: 28 meq/L (ref 19–32)
CREATININE: 0.76 mg/dL (ref 0.40–1.20)
GFR: 82.56 mL/min (ref >60.00)
Glucose, Bld: 95 mg/dL (ref 70–99)
Potassium: 3.8 mEq/L (ref 3.5–5.1)
SODIUM: 140 meq/L (ref 135–145)

## 2016-06-17 LAB — COMPREHENSIVE METABOLIC PANEL
ALBUMIN: 4.4 g/dL (ref 3.5–5.2)
ALK PHOS: 130 U/L — AB (ref 39–117)
ALT: 11 U/L (ref 0–35)
AST: 17 U/L (ref 0–37)
BILIRUBIN TOTAL: 0.3 mg/dL (ref 0.2–1.2)
BUN: 7 mg/dL (ref 6–23)
CO2: 28 mEq/L (ref 19–32)
Calcium: 9.6 mg/dL (ref 8.4–10.5)
Chloride: 103 mEq/L (ref 96–112)
Creatinine, Ser: 0.76 mg/dL (ref 0.40–1.20)
GFR: 82.56 mL/min (ref >60.00)
GLUCOSE: 95 mg/dL (ref 70–99)
POTASSIUM: 3.8 meq/L (ref 3.5–5.1)
Sodium: 140 mEq/L (ref 135–145)
TOTAL PROTEIN: 7.4 g/dL (ref 6.0–8.3)

## 2016-06-17 LAB — CBC
HEMATOCRIT: 38.1 % (ref 36.0–46.0)
HEMOGLOBIN: 12.6 g/dL (ref 12.0–15.0)
MCHC: 33.2 g/dL (ref 30.0–36.0)
MCV: 89.4 fl (ref 78.0–100.0)
PLATELETS: 553 10*3/uL — AB (ref 150.0–400.0)
RBC: 4.26 Mil/uL (ref 3.87–5.11)
RDW: 15 % (ref 11.5–15.5)
WBC: 6.1 10*3/uL (ref 4.0–10.5)

## 2016-06-17 MED ORDER — POTASSIUM CHLORIDE ER 20 MEQ PO TBCR: 20.0000 meq | EXTENDED_RELEASE_TABLET | Freq: Every day | ORAL | 2 refills | Status: DC

## 2016-06-17 NOTE — Patient Instructions (Addendum)
BEFORE YOU LEAVE: -follow up: 3 months -lab appointment monthly for the next 2 months -labs  Continue the potassium daily.  We have ordered labs or studies at this visit. It can take up to 1-2 weeks for results and processing. IF results require follow up or explanation, we will call you with instructions. Clinically stable results will be released to your [facility]. If you have not heard from [place] or cannot find your results in [facility] in 2 weeks please contact our office at [phone].  If you are not yet signed up for [facility], please consider signing up.  We recommend the following healthy lifestyle: 1) Small portions - eat off of salad plate instead of dinner plate 2) Eat a healthy clean diet with avoidance of (less then 1 serving per week) processed foods, sweetened drinks, white starches, red meat, fast foods and sweets and consisting of: * 5-9 servings per day of fresh or frozen fruits and vegetables (not corn or potatoes, not dried or canned) *nuts and seeds, beans *olives and olive oil *small portions of lean meats such as fish and white chicken  *small portions of whole grains 3)Get at least 150 minutes of sweaty aerobic exercise per week 4)reduce stress - counseling, meditation, relaxation to balance other aspects of your life

## 2016-06-17 NOTE — Progress Notes (Signed)
HPI:  Erin [name] is a very pleasant 60 yo with unfortunately a complicated PMH sig for DCDM, Hypothyroidism, GERD, IBS, chronic NV, hx Anal Ca, Depression, Anxiety, Neurodermatitis, Vertigo, HAs and B12 def here for follow up after a recent hospitalization. Hospitalized 7/17-7/19 for hypokalemia and syncope.Per note, no cause found. EKG with st depression - per note not felt to be ischemic.Echo with mild diastolic CHF, but otherwise ok. Apparently per notes telemetry ok. CT angio chest normal except some incidental aortic atherosclerosis. Labs showed mild anemia, hypokalemia, mild elevation in troponin that trended down, mild transaminitis. Per discharge notes, hospitalist advised referral to cardiologist if further syncopal events. She reports: she has been doing well since discharge with no further syncopal events. Denies: cp, sob, syncope, dizziness, vomiting, nausea, fevers.  ROS: See pertinent positives and negatives per HPI.  Past Medical History:  Diagnosis Date  . Allergy   . Alopecia   . Anal cancer ([place]) 08/14/13   invasive squamous cell ca, s/p radiation 10/20-11/26/14 60.4Gy/44f and chemo  . Anxiety   . B12 deficiency anemia 09/14/2015  . Blood transfusion without reported diagnosis   . Chronic back pain   . Chronic daily headache 03/29/2013   takes bc powder  . Closed right hip fracture ([place] 09/10/2015  . Depression   . DM (diabetes mellitus) ([place]    diet controlled  . GERD (gastroesophageal reflux disease)   . HLD (hyperlipidemia)   . Hot flashes   . Hyperlipemia 04/11/2014  . Hypokalemia 05/2016  . Hypothyroidism 09/10/2015  . IBS (irritable bowel syndrome) 03/29/2013  . 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 Tubular adenoma of colon 09/08/2003  . Vertigo     Past Surgical History:  Procedure Laterality Date  . COLONOSCOPY    . ECTOPIC PREGNANCY SURGERY    . FLEXIBLE SIGMOIDOSCOPY N/A 08/14/2013   Procedure: FLEXIBLE SIGMOIDOSCOPY;  Surgeon: MLadene Artist MD;  Location: [facility];  Service: Endoscopy;  Laterality: N/A;  . PILONIDAL CYST EXCISION    . POLYPECTOMY    . TONSILLECTOMY    . TOTAL HIP ARTHROPLASTY  09/11/2015   Procedure: TOTAL HIP ARTHROPLASTY;  Surgeon: TRenette Butters MD;  [place]  Service: Orthopedics;;    Family History  Problem Relation Age of Onset  . Arthritis Mother   . Hyperlipidemia Mother   . Heart disease Mother   . Hypertension Mother   . Stroke Mother 831 . Irritable bowel syndrome Mother   . Thyroid disease Mother   . Heart disease Father   . Hyperlipidemia Father   . Hypertension Father   . Stroke Father 944 . Thyroid disease Father   . Prostate cancer Father   . Lung cancer Brother   . Colon cancer Neg Hx   . Rectal cancer Neg Hx   . Stomach cancer Neg Hx   . Heart attack Mother   . Heart attack Father   . Heart attack Maternal Grandfather   . Heart attack Maternal Uncle     Social History   Social History  . Marital status: Married    Spouse name: N/A  . Number of children: 0  . Years of education: N/A   Occupational History  . caregiver    Social History Main Topics  . Smoking status: Former Smoker    Packs/day: 0.50    Types: Cigarettes    Quit date: 10/07/2014  . Smokeless tobacco: Never Used  . Alcohol use No  . Drug  use: No  . Sexual activity: Yes    Partners: Male   Other Topics Concern  . None   Social History Narrative   Work or School: homemaker      Home Situation: lives with her husband,[name] takes care of her elderly parents       Spiritual Beliefs: Christian      Lifestyle: no regular exercise, poor diet                 Current Outpatient Prescriptions:  .  dicyclomine (BENTYL) 10 MG capsule, Take 1 capsule (10 mg total) by mouth 3 (three) times daily before meals., Disp: 270 capsule, Rfl: 3 .  fluticasone (FLONASE) 50 MCG/ACT nasal spray, Place 1 spray into both nostrils daily., Disp: 48 g, Rfl: 1 .  gabapentin (NEURONTIN) 600  MG tablet, Take 1 tablet (600 mg total) by mouth 3 (three) times daily., Disp: 270 tablet, Rfl: 1 .  levothyroxine (SYNTHROID, LEVOTHROID) 75 MCG tablet, Take 1 tablet (75 mcg total) by mouth daily., Disp: 90 tablet, Rfl: 1 .  linaclotide (LINZESS) 290 MCG CAPS capsule, Take 1 capsule (290 mcg total) by mouth daily before breakfast., Disp: 30 capsule, Rfl: 6 .  LINZESS 145 MCG CAPS capsule, TAKE ONE CAPSULE BY MOUTH EVERY DAY BEFORE BREAKFAST, Disp: , Rfl: 1 .  nystatin-triamcinolone (MYCOLOG II) cream, APPLY TO CORNERS OF THE MOUTH TWICE A DAY, Disp: , Rfl: 2 .  omeprazole (PRILOSEC) 40 MG capsule, Take 1 capsule (40 mg total) by mouth daily., Disp: 90 capsule, Rfl: 3 .  ondansetron (ZOFRAN) 4 MG tablet, Take 1 tablet (4 mg total) by mouth every 8 (eight) hours as needed for nausea or vomiting., Disp: 30 tablet, Rfl: 0 .  PARoxetine (PAXIL) 20 MG tablet, Take 1 tablet (20 mg total) by mouth daily., Disp: 90 tablet, Rfl: 1 .  Potassium Chloride ER 20 MEQ TBCR, Take 20 mEq by mouth daily., Disp: 30 tablet, Rfl: 2 .  vitamin B-12 1000 MCG tablet, Take 1 tablet (1,000 mcg total) by mouth daily., Disp: 30 tablet, Rfl: 0  EXAM:  Vitals:   06/17/16 1402  BP: 136/88  Pulse: 90  Temp: 98.4 F (36.9 C)    Body mass index is 20.6 kg/m.  GENERAL: vitals reviewed and listed above, alert, oriented, appears well hydrated and in no acute distress  HEENT: atraumatic, conjunttiva clear, no obvious abnormalities on inspection of external nose and ears  NECK: no obvious masses on inspection  LUNGS: clear to auscultation bilaterally, no wheezes, rales or rhonchi, good air movement  CV: HRRR, no peripheral edema  MS: moves all extremities without noticeable abnormality  PSYCH: pleasant and cooperative, no obvious depression or anxiety  ASSESSMENT AND PLAN:  Discussed the following assessment and plan:  Hypokalemia - Plan: CMP with eGFR, Basic Metabolic Panel  Hypothyroidism, unspecified  hypothyroidism type  GERD without esophagitis  Chronic daily headache  Type 2 diabetes mellitus without complication, without long-term current use of insulin (HCC)  B12 deficiency anemia - Plan: CBC (no diff)  Elevated transaminase level  -recheck labs -this is not the first time she suddenly had low potassium, will continue oral potassium, recheck BMP q month for 3 months -follow up in 3 months and as needed -Patient advised to return or notify a doctor immediately if symptoms worsen or persist or new concerns arise.  Patient Instructions  BEFORE YOU LEAVE: -follow up: 3 months -lab appointment monthly for the next 2 months -labs  Continue the potassium daily.  We have ordered labs or studies at this visit. It can take up to 1-2 weeks for results and processing. IF results require follow up or explanation, we will call you with instructions. Clinically stable results will be released to your [facility]. If you have not heard from [place] or cannot find your results in [facility] in 2 weeks please contact our office at [phone].  If you are not yet signed up for [facility], please consider signing up.  We recommend the following healthy lifestyle: 1) Small portions - eat off of salad plate instead of dinner plate 2) Eat a healthy clean diet with avoidance of (less then 1 serving per week) processed foods, sweetened drinks, white starches, red meat, fast foods and sweets and consisting of: * 5-9 servings per day of fresh or frozen fruits and vegetables (not corn or potatoes, not dried or canned) *nuts and seeds, beans *olives and olive oil *small portions of lean meats such as fish and white chicken  *small portions of whole grains 3)Get at least 150 minutes of sweaty aerobic exercise per week 4)reduce stress - counseling, meditation, relaxation to balance other aspects of your life     Colin Benton R., DO

## 2016-06-17 NOTE — Progress Notes (Signed)
Pre visit review using our clinic review tool, if applicable. No additional management support is needed unless otherwise documented below in the visit note. 

## 2016-06-20 NOTE — Addendum Note (Signed)
Addended by: Agnes Lawrence on: 06/20/2016 10:38 AM   Modules accepted: Orders

## 2016-06-21 ENCOUNTER — Ambulatory Visit: Payer: BLUE CROSS/BLUE SHIELD | Admitting: Family Medicine

## 2016-06-27 ENCOUNTER — Other Ambulatory Visit (INDEPENDENT_AMBULATORY_CARE_PROVIDER_SITE_OTHER): Payer: BLUE CROSS/BLUE SHIELD

## 2016-06-27 DIAGNOSIS — R899 Unspecified abnormal finding in specimens from other organs, systems and tissues: Secondary | ICD-10-CM | POA: Diagnosis not present

## 2016-06-27 DIAGNOSIS — E559 Vitamin D deficiency, unspecified: Secondary | ICD-10-CM

## 2016-06-27 LAB — CBC WITH DIFFERENTIAL/PLATELET
BASOS PCT: 0.4 % (ref 0.0–3.0)
Basophils Absolute: 0 10*3/uL (ref 0.0–0.1)
EOS PCT: 3.2 % (ref 0.0–5.0)
Eosinophils Absolute: 0.2 10*3/uL (ref 0.0–0.7)
HEMATOCRIT: 37 % (ref 36.0–46.0)
HEMOGLOBIN: 12.5 g/dL (ref 12.0–15.0)
LYMPHS PCT: 13.1 % (ref 12.0–46.0)
Lymphs Abs: 0.7 10*3/uL (ref 0.7–4.0)
MCHC: 33.9 g/dL (ref 30.0–36.0)
MCV: 89.1 fl (ref 78.0–100.0)
MONO ABS: 0.4 10*3/uL (ref 0.1–1.0)
MONOS PCT: 7.3 % (ref 3.0–12.0)
Neutro Abs: 4.2 10*3/uL (ref 1.4–7.7)
Neutrophils Relative %: 76 % (ref 43.0–77.0)
Platelets: 353 10*3/uL (ref 150.0–400.0)
RBC: 4.15 Mil/uL (ref 3.87–5.11)
RDW: 14.4 % (ref 11.5–15.5)
WBC: 5.6 10*3/uL (ref 4.0–10.5)

## 2016-06-27 LAB — COMPREHENSIVE METABOLIC PANEL
ALBUMIN: 4.1 g/dL (ref 3.5–5.2)
ALT: 8 U/L (ref 0–35)
AST: 12 U/L (ref 0–37)
Alkaline Phosphatase: 115 U/L (ref 39–117)
BUN: 9 mg/dL (ref 6–23)
CALCIUM: 9.3 mg/dL (ref 8.4–10.5)
CHLORIDE: 107 meq/L (ref 96–112)
CO2: 25 meq/L (ref 19–32)
Creatinine, Ser: 0.68 mg/dL (ref 0.40–1.20)
GFR: 93.86 mL/min (ref >60.00)
Glucose, Bld: 73 mg/dL (ref 70–99)
POTASSIUM: 3.5 meq/L (ref 3.5–5.1)
Sodium: 141 mEq/L (ref 135–145)
Total Bilirubin: 0.3 mg/dL (ref 0.2–1.2)
Total Protein: 6.9 g/dL (ref 6.0–8.3)

## 2016-06-27 LAB — VITAMIN D 25 HYDROXY (VIT D DEFICIENCY, FRACTURES): VITD: 11.65 ng/mL — AB (ref 30.00–100.00)

## 2016-07-08 ENCOUNTER — Ambulatory Visit: Payer: BLUE CROSS/BLUE SHIELD

## 2016-07-14 ENCOUNTER — Ambulatory Visit: Payer: BLUE CROSS/BLUE SHIELD | Admitting: Oncology

## 2016-07-29 ENCOUNTER — Telehealth: Payer: Self-pay | Admitting: Oncology

## 2016-07-29 ENCOUNTER — Ambulatory Visit (HOSPITAL_BASED_OUTPATIENT_CLINIC_OR_DEPARTMENT_OTHER): Payer: BLUE CROSS/BLUE SHIELD | Admitting: Oncology

## 2016-07-29 VITALS — BP 133/87 | HR 78 | Temp 98.5°F | Resp 18 | Wt 115.1 lb

## 2016-07-29 DIAGNOSIS — E119 Type 2 diabetes mellitus without complications: Secondary | ICD-10-CM

## 2016-07-29 DIAGNOSIS — E785 Hyperlipidemia, unspecified: Secondary | ICD-10-CM | POA: Diagnosis not present

## 2016-07-29 DIAGNOSIS — E538 Deficiency of other specified B group vitamins: Secondary | ICD-10-CM

## 2016-07-29 DIAGNOSIS — C210 Malignant neoplasm of anus, unspecified: Secondary | ICD-10-CM

## 2016-07-29 DIAGNOSIS — Z85048 Personal history of other malignant neoplasm of rectum, rectosigmoid junction, and anus: Secondary | ICD-10-CM | POA: Diagnosis not present

## 2016-07-29 DIAGNOSIS — L280 Lichen simplex chronicus: Secondary | ICD-10-CM

## 2016-07-29 NOTE — Telephone Encounter (Signed)
GAVE PATIENT AVS REPORT AND APPOINTMENTS FOR MAY °

## 2016-07-29 NOTE — Progress Notes (Signed)
  [place] OFFICE PROGRESS NOTE   Diagnosis: Anal cancer  INTERVAL HISTORY:   Erin [name] returns for a scheduled visit. She feels well. She has a "hemorrhoid ". She was admitted in July with hypokalemia and syncope. She continues a potassium supplement and is followed by Dr. Maudie Mercury. Her bowel habits have improved since beginning Wednesday.  Objective:  Vital signs in last 24 hours:  Blood pressure 133/87, pulse 78, temperature 98.5 F (36.9 C), temperature source Oral, resp. rate 18, weight 115 lb 1 oz (52.2 kg), SpO2 100 %.    HEENT: Neck without mass Lymphatics: No cervical, supraclavicular, axillary, or inguinal nodes. Resp: Lungs clear bilaterally Cardio: Regular rate and rhythm GI: No hepatosplenomegaly, nontender, no mass Vascular: No leg edema Rectal: Small hemorrhoid at the anterior anal verge, no mass, irregular scar tissue at the anterior anal canal    Lab Results:  Lab Results  Component Value Date   WBC 5.6 06/27/2016   HGB 12.5 06/27/2016   HCT 37.0 06/27/2016   MCV 89.1 06/27/2016   PLT 353.0 06/27/2016   NEUTROABS 4.2 06/27/2016    Medications: I have reviewed the patient's current medications.  Assessment/Plan: 1. Squamous cell carcinoma of the anus, clinical stage II (T2 N0), status post endoscopic biopsy 08/14/2013 confirming invasive squamous cell carcinoma. Initiation of radiation and cycle 1 5-FU/mitomycin C. 09/09/2013. Cycle 2 5-FU/mitomycin-C (dose reduction) completed beginning 10/08/2013, radiation completed 10/16/2013. 2. Rectal pain, constipation and bleeding secondary to #1. Resolved. 3. History of Iron deficiency anemia. 4. Vitamin B12 deficiency. 5. Diabetes. 6. Hyperlipidemia. 7. Neurodermatitis. 8. Tobacco use. She reports she has quit smoking. 9. Mucositis following cycle 1 and cycle 2 5-FU/mitomycin C.  10. Skin rash. Initially felt to be related to the chemotherapy though somewhat atypical in appearance and  distribution. The rash has resolved. 11. 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  12. History of hypokalemia. 13. Status post sigmoidoscopy 03/10/2014 with findings of distal rectal/anal canal nodularity measuring 2 cm x 2 cm. Biopsy showed benign ulcerated anorectal junction mucosa and inflammatory debris. Within the debris there were markedly atypical epithelial cell fragments and single cells. 14. Status post sigmoidoscopy 09/23/2014. Area of abnormal mucosa in the distal rectum/anal canal-friable, erythematous, scarring; multiple biopsies performed. Pathology revealed no dysplasia or malignancy. 15. Bilateral hip pain. Status post CT abdomen/pelvis 08/11/2014 with no etiology for the pain identified. Resolved after physical therapy. 16. Right hip fracture following a fall 09/10/2015 status post total hip arthroplasty 09/11/2015     Disposition:  Ms. Hults remains in clinical remission from anal cancer. She will return for an office visit in 8 months. She will continue surveillance colonoscopies with Dr. Fuller Plan.  Betsy Coder, MD  07/29/2016  10:55 AM

## 2016-08-03 ENCOUNTER — Telehealth: Payer: Self-pay | Admitting: Family Medicine

## 2016-08-03 DIAGNOSIS — E876 Hypokalemia: Secondary | ICD-10-CM

## 2016-08-03 NOTE — Telephone Encounter (Signed)
Pt would like to have a order to have her potassium checked.  May I have a order please?

## 2016-08-04 NOTE — Telephone Encounter (Signed)
Order placed

## 2016-08-05 ENCOUNTER — Other Ambulatory Visit: Payer: Self-pay

## 2016-08-05 DIAGNOSIS — K2970 Gastritis, unspecified, without bleeding: Secondary | ICD-10-CM

## 2016-08-05 DIAGNOSIS — R1110 Vomiting, unspecified: Secondary | ICD-10-CM

## 2016-08-05 MED ORDER — LINZESS 145 MCG PO CAPS: ORAL_CAPSULE | ORAL | 1 refills | Status: DC

## 2016-08-05 MED ORDER — DICYCLOMINE HCL 10 MG PO CAPS: 10.0000 mg | ORAL_CAPSULE | Freq: Three times a day (TID) | ORAL | 1 refills | Status: DC

## 2016-08-08 ENCOUNTER — Other Ambulatory Visit (INDEPENDENT_AMBULATORY_CARE_PROVIDER_SITE_OTHER): Payer: BLUE CROSS/BLUE SHIELD

## 2016-08-08 DIAGNOSIS — E876 Hypokalemia: Secondary | ICD-10-CM

## 2016-08-08 DIAGNOSIS — E559 Vitamin D deficiency, unspecified: Secondary | ICD-10-CM | POA: Diagnosis not present

## 2016-08-08 LAB — POTASSIUM: POTASSIUM: 3.9 meq/L (ref 3.5–5.1)

## 2016-08-08 LAB — VITAMIN D 25 HYDROXY (VIT D DEFICIENCY, FRACTURES): VITD: 24.27 ng/mL — ABNORMAL LOW (ref 30.00–100.00)

## 2016-08-15 ENCOUNTER — Telehealth: Payer: Self-pay

## 2016-08-15 ENCOUNTER — Ambulatory Visit (INDEPENDENT_AMBULATORY_CARE_PROVIDER_SITE_OTHER): Payer: BLUE CROSS/BLUE SHIELD | Admitting: Family Medicine

## 2016-08-15 ENCOUNTER — Encounter: Payer: Self-pay | Admitting: Family Medicine

## 2016-08-15 VITALS — BP 100/60 | HR 83 | Temp 98.3°F | Ht 63.0 in | Wt 111.8 lb

## 2016-08-15 DIAGNOSIS — M791 Myalgia, unspecified site: Secondary | ICD-10-CM

## 2016-08-15 DIAGNOSIS — L299 Pruritus, unspecified: Secondary | ICD-10-CM | POA: Diagnosis not present

## 2016-08-15 DIAGNOSIS — Z8619 Personal history of other infectious and parasitic diseases: Secondary | ICD-10-CM | POA: Diagnosis not present

## 2016-08-15 DIAGNOSIS — Z23 Encounter for immunization: Secondary | ICD-10-CM | POA: Diagnosis not present

## 2016-08-15 DIAGNOSIS — R29898 Other symptoms and signs involving the musculoskeletal system: Secondary | ICD-10-CM

## 2016-08-15 DIAGNOSIS — E559 Vitamin D deficiency, unspecified: Secondary | ICD-10-CM

## 2016-08-15 MED ORDER — ACYCLOVIR 5 % EX CREA: 1.0000 "application " | TOPICAL_CREAM | CUTANEOUS | 0 refills | Status: DC

## 2016-08-15 NOTE — Telephone Encounter (Signed)
Received PA request from [facility] for Zovirax cream. PA submitted & is pending. Key: [id number]

## 2016-08-15 NOTE — Progress Notes (Signed)
HPI:  Acute visit for multiple issues:  Itchy skin: -R flank for 3 days -after scratching was a little tender so she thought might be shingles -no rash, fever, malaise  Muscle soreness: -generalized, chronic -report "always" hurts if anyone touches her anywhere -soreness throughout legs and sometimes L shoulder, neck, back -sometimes feels like legs will give out due to pain if overdoes things -no regular exercise - overdid it helping mom a few weeks ago and now sore -seeing ortho for this and has appt coming up -denies: fevers, weakness, numbness, jt pain or swelling  Vit d def/hypkalemia: -potassium normal last check -vit d low at check in last 1 week - taking now   Cold sores: -uses zovirax when has outbreak and wants refill  ROS: See pertinent positives and negatives per HPI.  Past Medical History:  Diagnosis Date  . Allergy   . Alopecia   . 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 Anxiety   . B12 deficiency anemia 09/14/2015  . Blood transfusion without reported diagnosis   . Chronic back pain   . Chronic daily headache 03/29/2013   takes bc powder  . Closed right hip fracture ([place]) 09/10/2015  . Closed right hip fracture ([place]) 09/10/2015  . Depression   . DM (diabetes mellitus) ([place])    diet controlled  . GERD (gastroesophageal reflux disease)   . HLD (hyperlipidemia)   . Hot flashes   . Hyperlipemia 04/11/2014  . Hypokalemia 05/2016  . Hypothyroidism 09/10/2015  . IBS (irritable bowel syndrome) 03/29/2013  . 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 Tubular adenoma of colon 09/08/2003  . Vertigo     Past Surgical History:  Procedure Laterality Date  . COLONOSCOPY    . ECTOPIC PREGNANCY SURGERY    . FLEXIBLE SIGMOIDOSCOPY N/A 08/14/2013   Procedure: FLEXIBLE SIGMOIDOSCOPY;  Surgeon: Ladene Artist, MD;  [facility];  Service: Endoscopy;  Laterality: N/A;  . PILONIDAL CYST  EXCISION    . POLYPECTOMY    . TONSILLECTOMY    . TOTAL HIP ARTHROPLASTY  09/11/2015   Procedure: TOTAL HIP ARTHROPLASTY;  Surgeon: Renette Butters, MD;  Location: [facility];  Service: Orthopedics;;    Family History  Problem Relation Age of Onset  . Arthritis Mother   . Hyperlipidemia Mother   . Heart disease Mother   . Hypertension Mother   . Stroke Mother 68  . Irritable bowel syndrome Mother   . Thyroid disease Mother   . Heart disease Father   . Hyperlipidemia Father   . Hypertension Father   . Stroke Father 30  . Thyroid disease Father   . Prostate cancer Father   . Lung cancer Brother   . Colon cancer Neg Hx   . Rectal cancer Neg Hx   . Stomach cancer Neg Hx   . Heart attack Mother   . Heart attack Father   . Heart attack Maternal Grandfather   . Heart attack Maternal Uncle     Social History   Social History  . Marital status: Married    Spouse name: N/A  . Number of children: 0  . Years of education: N/A   Occupational History  . caregiver    Social History Main Topics  . Smoking status: Former Smoker    Packs/day: 0.50    Types: Cigarettes    Quit date: 10/07/2014  . Smokeless tobacco: Never Used  . Alcohol use  No  . Drug use: No  . Sexual activity: Yes    Partners: Male   Other Topics Concern  . None   Social History Narrative   Work or School: homemaker      Home Situation: lives with her husband,[name] takes care of her elderly parents       Spiritual Beliefs: Christian      Lifestyle: no regular exercise, poor diet                 Current Outpatient Prescriptions:  .  dicyclomine (BENTYL) 10 MG capsule, Take 1 capsule (10 mg total) by mouth 3 (three) times daily before meals., Disp: 270 capsule, Rfl: 1 .  fluticasone (FLONASE) 50 MCG/ACT nasal spray, Place 1 spray into both nostrils daily., Disp: 48 g, Rfl: 1 .  gabapentin (NEURONTIN) 600 MG tablet, Take 1 tablet (600 mg total) by mouth 3 (three) times daily., Disp: 270 tablet,  Rfl: 1 .  levothyroxine (SYNTHROID, LEVOTHROID) 75 MCG tablet, Take 1 tablet (75 mcg total) by mouth daily., Disp: 90 tablet, Rfl: 1 .  LINZESS 145 MCG CAPS capsule, TAKE ONE CAPSULE BY MOUTH EVERY DAY BEFORE BREAKFAST, Disp: 90 capsule, Rfl: 1 .  nystatin-triamcinolone (MYCOLOG II) cream, APPLY TO CORNERS OF THE MOUTH TWICE A DAY, Disp: , Rfl: 2 .  omeprazole (PRILOSEC) 40 MG capsule, Take 1 capsule (40 mg total) by mouth daily., Disp: 90 capsule, Rfl: 3 .  ondansetron (ZOFRAN) 4 MG tablet, Take 1 tablet (4 mg total) by mouth every 8 (eight) hours as needed for nausea or vomiting., Disp: 30 tablet, Rfl: 0 .  PARoxetine (PAXIL) 20 MG tablet, Take 1 tablet (20 mg total) by mouth daily., Disp: 90 tablet, Rfl: 1 .  Potassium Chloride ER 20 MEQ TBCR, Take 20 mEq by mouth daily., Disp: 30 tablet, Rfl: 2 .  vitamin B-12 1000 MCG tablet, Take 1 tablet (1,000 mcg total) by mouth daily., Disp: 30 tablet, Rfl: 0 .  acyclovir cream (ZOVIRAX) 5 %, Apply 1 application topically every 3 (three) hours., Disp: 15 g, Rfl: 0  EXAM:  Vitals:   08/15/16 1320  BP: 100/60  Pulse: 83  Temp: 98.3 F (36.8 C)    Body mass index is 19.8 kg/m.  GENERAL: vitals reviewed and listed above, alert, oriented, appears well hydrated and in no acute distress  HEENT: atraumatic, conjunttiva clear, no obvious abnormalities on inspection of external nose and ears  NECK: no obvious masses on inspection  LUNGS: clear to auscultation bilaterally, no wheezes, rales or rhonchi, good air movement  CV: HRRR, no peripheral edema  SKIN: No rash in area of concern on the right flank, abdomen or back  MS/NEURO: moves all extremities without noticeable abnormality, gait normal, able to climb on exam table; normal sensitivity to light touch, DTRs and strength throughout in the lower extremities bilaterally; negative straight leg raising test, negative cross leg raising test, tender to touch throughout in all muscle groups in the  legs and in the upper back/shoulders and arms  PSYCH: pleasant and cooperative, no obvious depression or anxiety  ASSESSMENT AND PLAN:  Discussed the following assessment and plan:  Pruritus -Doubt shingles, possible dry skin -Opted to try topical hydrocortisone cream and moisturizer and monitor him - advised to contact us immediately if rash should appear her new symptoms arise  Muscular deconditioning - Plan: Ambulatory referral to Physical Therapy Muscle soreness - Plan: Ambulatory referral to Physical Therapy Vitamin D deficiency -Query fibromyalgia given her long history  of tender muscle tissues -She will plan to see orthopedics as planned, but we also opted to try some physical therapy as she has slowly become quite deconditioned over the last few years -Discussed potentially seeing rheumatology if symptoms persist, but she opted to start with her orthopedic doctor first -Vitamin D, will monitor levels  H/O cold sores -Refilled acyclovir cream per her request  -Patient advised to return or notify a doctor immediately if symptoms worsen or persist or new concerns arise.  Patient Instructions  BEFORE YOU LEAVE: -follow up: as scheduled -flu shot  Potassium was normal on recent labs.  Take the vit D3 1000-2000 IU daily.  See the orthopedic doctor as planned  We placed a referral for you as discussed for physical therapy. It usually takes about 1-2 weeks to process and schedule this referral. If you have not heard from [place] regarding this appointment in 2 weeks please contact our office.     Colin Benton R., DO

## 2016-08-15 NOTE — Patient Instructions (Signed)
BEFORE YOU LEAVE: -follow up: as scheduled -flu shot  Potassium was normal on recent labs.  Take the vit D3 1000-2000 IU daily.  See the orthopedic doctor as planned  We placed a referral for you as discussed for physical therapy. It usually takes about 1-2 weeks to process and schedule this referral. If you have not heard from [place] regarding this appointment in 2 weeks please contact our office.

## 2016-08-15 NOTE — Progress Notes (Signed)
Pre visit review using our clinic review tool, if applicable. No additional management support is needed unless otherwise documented below in the visit note. 

## 2016-08-17 NOTE — Telephone Encounter (Signed)
Erin [name] pt state that [place] would like for her to try something different and she would like to have the Zovirax ointment.

## 2016-08-17 NOTE — Telephone Encounter (Signed)
PA denied. Patient needs to try two of the alternatives: Zovirax ointment Acyclovir tablets Famciclovir tablets Valacyclovir tablets.

## 2016-08-18 MED ORDER — ACYCLOVIR 5 % EX OINT: 1.0000 "application " | TOPICAL_OINTMENT | CUTANEOUS | 1 refills | Status: DC

## 2016-08-18 NOTE — Telephone Encounter (Signed)
Sent new rx for ointment.

## 2016-09-12 ENCOUNTER — Other Ambulatory Visit (HOSPITAL_COMMUNITY)
Admission: RE | Admit: 2016-09-12 | Discharge: 2016-09-12 | Disposition: A | Discharge status: Home / Self Care | Payer: BLUE CROSS/BLUE SHIELD | Source: Ambulatory Visit | Attending: Obstetrics and Gynecology | Admitting: Obstetrics and Gynecology

## 2016-09-12 ENCOUNTER — Other Ambulatory Visit: Payer: Self-pay | Admitting: Obstetrics and Gynecology

## 2016-09-12 DIAGNOSIS — Z01419 Encounter for gynecological examination (general) (routine) without abnormal findings: Secondary | ICD-10-CM | POA: Insufficient documentation

## 2016-09-12 DIAGNOSIS — Z1151 Encounter for screening for human papillomavirus (HPV): Secondary | ICD-10-CM | POA: Diagnosis not present

## 2016-09-14 LAB — CYTOLOGY - PAP
Diagnosis: NEGATIVE
HPV: NOT DETECTED

## 2016-09-16 ENCOUNTER — Ambulatory Visit: Payer: BLUE CROSS/BLUE SHIELD | Admitting: Family Medicine

## 2016-09-26 ENCOUNTER — Other Ambulatory Visit (INDEPENDENT_AMBULATORY_CARE_PROVIDER_SITE_OTHER): Payer: BLUE CROSS/BLUE SHIELD

## 2016-09-26 ENCOUNTER — Other Ambulatory Visit: Payer: Self-pay | Admitting: *Deleted

## 2016-09-26 DIAGNOSIS — E785 Hyperlipidemia, unspecified: Secondary | ICD-10-CM

## 2016-09-26 DIAGNOSIS — I10 Essential (primary) hypertension: Secondary | ICD-10-CM

## 2016-09-26 DIAGNOSIS — E559 Vitamin D deficiency, unspecified: Secondary | ICD-10-CM | POA: Diagnosis not present

## 2016-09-26 DIAGNOSIS — R7989 Other specified abnormal findings of blood chemistry: Secondary | ICD-10-CM

## 2016-09-26 LAB — BASIC METABOLIC PANEL
BUN: 8 mg/dL (ref 6–23)
CALCIUM: 9.8 mg/dL (ref 8.4–10.5)
CO2: 26 meq/L (ref 19–32)
CREATININE: 0.84 mg/dL (ref 0.40–1.20)
Chloride: 102 mEq/L (ref 96–112)
GFR: 73.49 mL/min (ref >60.00)
Glucose, Bld: 67 mg/dL — ABNORMAL LOW (ref 70–99)
Potassium: 4.5 mEq/L (ref 3.5–5.1)
SODIUM: 138 meq/L (ref 135–145)

## 2016-09-26 LAB — LIPID PANEL
CHOL/HDL RATIO: 9
Cholesterol: 349 mg/dL — ABNORMAL HIGH (ref 0–200)
HDL: 39.7 mg/dL (ref >39.00)
NONHDL: 308.98
TRIGLYCERIDES: 366 mg/dL — AB (ref 0.0–149.0)
VLDL: 73.2 mg/dL — AB (ref 0.0–40.0)

## 2016-09-26 LAB — LDL CHOLESTEROL, DIRECT: LDL DIRECT: 214 mg/dL

## 2016-09-26 LAB — VITAMIN D 25 HYDROXY (VIT D DEFICIENCY, FRACTURES): VITD: 40.57 ng/mL (ref 30.00–100.00)

## 2016-10-03 ENCOUNTER — Encounter: Payer: Self-pay | Admitting: Family Medicine

## 2016-10-03 ENCOUNTER — Ambulatory Visit: Payer: BLUE CROSS/BLUE SHIELD | Admitting: Family Medicine

## 2016-10-03 ENCOUNTER — Ambulatory Visit (INDEPENDENT_AMBULATORY_CARE_PROVIDER_SITE_OTHER): Payer: BLUE CROSS/BLUE SHIELD | Admitting: Family Medicine

## 2016-10-03 VITALS — BP 90/60 | HR 86 | Temp 98.1°F | Ht 63.0 in | Wt 112.6 lb

## 2016-10-03 DIAGNOSIS — E785 Hyperlipidemia, unspecified: Secondary | ICD-10-CM

## 2016-10-03 DIAGNOSIS — E119 Type 2 diabetes mellitus without complications: Secondary | ICD-10-CM | POA: Diagnosis not present

## 2016-10-03 DIAGNOSIS — E039 Hypothyroidism, unspecified: Secondary | ICD-10-CM

## 2016-10-03 LAB — HEMOGLOBIN A1C: HEMOGLOBIN A1C: 5.7 % (ref 4.6–6.5)

## 2016-10-03 LAB — TSH: TSH: 2.04 u[IU]/mL (ref 0.35–4.50)

## 2016-10-03 NOTE — Progress Notes (Signed)
HPI:  Erin [name] is a pleasant 60 year old with a past medical history significant for diet controlled diabetes, hyperlipidemia, hypothyroidism, acid reflux, irritable bowel syndrome, chronic nausea and vomiting, anal cancer, depression, anxiety, neuro dermatitis, vertigo, headaches, B12 deficiency and intermittently low potassium here for follow-up. She sees a gastroenterologist and oncologist for her gastrointestinal conditions. She had labs done recently and her cholesterol was quite high again, and she was advised to follow-up with the lipid clinic. She is due for her diabetic eye exam, shingles vaccine since she has turned 60 recent and a hemoglobin A1c. She reports she is doing well overall. She is seeing a specialist about her gait and back issues and is having an injection for sacroiliitis. She will then be doing physical therapy. She did not start the cholesterol medicine provided by the lipid clinic, but she agrees to start him follow-up with them regarding her cholesterol. Denies any new concerns.  ROS: See pertinent positives and negatives per HPI.  Past Medical History:  Diagnosis Date  . Allergy   . Alopecia   . 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 Anxiety   . B12 deficiency anemia 09/14/2015  . Blood transfusion without reported diagnosis   . Chronic back pain   . Chronic daily headache 03/29/2013   takes bc powder  . Closed right hip fracture ([place]) 09/10/2015  . Depression   . DM (diabetes mellitus) ([place])    diet controlled  . GERD (gastroesophageal reflux disease)   . HLD (hyperlipidemia)   . Hot flashes   . Hyperlipemia 04/11/2014  . Hypokalemia 05/2016  . Hypothyroidism 09/10/2015  . IBS (irritable bowel syndrome) 03/29/2013  . 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 Tubular adenoma of colon 09/08/2003  . Vertigo     Past Surgical History:  Procedure Laterality Date  . COLONOSCOPY     . ECTOPIC PREGNANCY SURGERY    . FLEXIBLE SIGMOIDOSCOPY N/A 08/14/2013   Procedure: FLEXIBLE SIGMOIDOSCOPY;  Surgeon: Ladene Artist, MD;  [facility];  Service: Endoscopy;  Laterality: N/A;  . PILONIDAL CYST EXCISION    . POLYPECTOMY    . TONSILLECTOMY    . TOTAL HIP ARTHROPLASTY  09/11/2015   Procedure: TOTAL HIP ARTHROPLASTY;  Surgeon: Renette Butters, MD;  Location: [facility];  Service: Orthopedics;;    Family History  Problem Relation Age of Onset  . Arthritis Mother   . Hyperlipidemia Mother   . Heart disease Mother   . Hypertension Mother   . Stroke Mother 35  . Irritable bowel syndrome Mother   . Thyroid disease Mother   . Heart disease Father   . Hyperlipidemia Father   . Hypertension Father   . Stroke Father 32  . Thyroid disease Father   . Prostate cancer Father   . Lung cancer Brother   . Colon cancer Neg Hx   . Rectal cancer Neg Hx   . Stomach cancer Neg Hx   . Heart attack Mother   . Heart attack Father   . Heart attack Maternal Grandfather   . Heart attack Maternal Uncle     Social History   Social History  . Marital status: Married    Spouse name: N/A  . Number of children: 0  . Years of education: N/A   Occupational History  . caregiver    Social History Main Topics  . Smoking status: Former Smoker    Packs/day: 0.50  Types: Cigarettes    Quit date: 10/07/2014  . Smokeless tobacco: Never Used  . Alcohol use No  . Drug use: No  . Sexual activity: Yes    Partners: Male   Other Topics Concern  . None   Social History Narrative   Work or School: homemaker      Home Situation: lives with her husband,[name] takes care of her elderly parents       Spiritual Beliefs: Christian      Lifestyle: no regular exercise, poor diet                 Current Outpatient Prescriptions:  .  acyclovir ointment (ZOVIRAX) 5 %, Apply 1 application topically every 3 (three) hours., Disp: 15 g, Rfl: 1 .  dicyclomine (BENTYL) 10 MG capsule,  Take 1 capsule (10 mg total) by mouth 3 (three) times daily before meals., Disp: 270 capsule, Rfl: 1 .  fluticasone (FLONASE) 50 MCG/ACT nasal spray, Place 1 spray into both nostrils daily., Disp: 48 g, Rfl: 1 .  gabapentin (NEURONTIN) 600 MG tablet, Take 1 tablet (600 mg total) by mouth 3 (three) times daily., Disp: 270 tablet, Rfl: 1 .  levothyroxine (SYNTHROID, LEVOTHROID) 75 MCG tablet, Take 1 tablet (75 mcg total) by mouth daily., Disp: 90 tablet, Rfl: 1 .  LINZESS 145 MCG CAPS capsule, TAKE ONE CAPSULE BY MOUTH EVERY DAY BEFORE BREAKFAST, Disp: 90 capsule, Rfl: 1 .  nystatin-triamcinolone (MYCOLOG II) cream, APPLY TO CORNERS OF THE MOUTH TWICE A DAY, Disp: , Rfl: 2 .  omeprazole (PRILOSEC) 40 MG capsule, Take 1 capsule (40 mg total) by mouth daily., Disp: 90 capsule, Rfl: 3 .  ondansetron (ZOFRAN) 4 MG tablet, Take 1 tablet (4 mg total) by mouth every 8 (eight) hours as needed for nausea or vomiting., Disp: 30 tablet, Rfl: 0 .  PARoxetine (PAXIL) 20 MG tablet, Take 1 tablet (20 mg total) by mouth daily., Disp: 90 tablet, Rfl: 1 .  Potassium Chloride ER 20 MEQ TBCR, Take 20 mEq by mouth daily., Disp: 30 tablet, Rfl: 2 .  vitamin B-12 1000 MCG tablet, Take 1 tablet (1,000 mcg total) by mouth daily., Disp: 30 tablet, Rfl: 0  EXAM:  Vitals:   10/03/16 1256  BP: 90/60  Pulse: 86  Temp: 98.1 F (36.7 C)    Body mass index is 19.95 kg/m.  GENERAL: vitals reviewed and listed above, alert, oriented, appears well hydrated and in no acute distress  HEENT: atraumatic, conjunttiva clear, no obvious abnormalities on inspection of external nose and ears  NECK: no obvious masses on inspection  LUNGS: clear to auscultation bilaterally, no wheezes, rales or rhonchi, good air movement  CV: HRRR, no peripheral edema  MS: moves all extremities without noticeable abnormality  PSYCH: pleasant and cooperative, no obvious depression or anxiety  ASSESSMENT AND PLAN:  Discussed the following  assessment and plan:  Hyperlipidemia, unspecified hyperlipidemia type  Type 2 diabetes mellitus without complication, without long-term current use of insulin (HCC)  Hypothyroidism, unspecified type  -TSH and hemoglobin A1c, she came in for labs on her own not at an appointment and these were not done -offered shingles vaccine, declined -lifestyle recs -advised to do eye exam -Patient advised to return or notify a doctor immediately if symptoms worsen or persist or new concerns arise.  Patient Instructions  BEFORE YOU LEAVE: -lab for thyroid and diabetes check -follow up: 4 months  Start your medication prescribed by the lipid clinic and schedule follow up with lipid specialist  in 2-3 months.  Schedule your diabetic eye exam.  We have ordered labs or studies at this visit. It can take up to 1-2 weeks for results and processing. IF results require follow up or explanation, we will call you with instructions. Clinically stable results will be released to your [facility]. If you have not heard from [place] or cannot find your results in [facility] in 2 weeks please contact our office at [phone].  If you are not yet signed up for [facility], please consider signing up.           Colin Benton R., DO

## 2016-10-03 NOTE — Progress Notes (Signed)
Pre visit review using our clinic review tool, if applicable. No additional management support is needed unless otherwise documented below in the visit note. 

## 2016-10-03 NOTE — Patient Instructions (Signed)
BEFORE YOU LEAVE: -lab for thyroid and diabetes check -follow up: 4 months  Start your medication prescribed by the lipid clinic and schedule follow up with lipid specialist in 2-3 months.  Schedule your diabetic eye exam.  We have ordered labs or studies at this visit. It can take up to 1-2 weeks for results and processing. IF results require follow up or explanation, we will call you with instructions. Clinically stable results will be released to your [facility]. If you have not heard from [place] or cannot find your results in [facility] in 2 weeks please contact our office at [phone].  If you are not yet signed up for [facility], please consider signing up.

## 2016-10-31 ENCOUNTER — Other Ambulatory Visit: Payer: Self-pay | Admitting: *Deleted

## 2016-10-31 MED ORDER — LEVOTHYROXINE SODIUM 75 MCG PO TABS: 75.0000 ug | ORAL_TABLET | Freq: Every day | ORAL | 1 refills | Status: DC

## 2016-10-31 MED ORDER — POTASSIUM CHLORIDE ER 20 MEQ PO TBCR: 20.0000 meq | EXTENDED_RELEASE_TABLET | Freq: Every day | ORAL | 0 refills | Status: DC

## 2016-10-31 MED ORDER — GABAPENTIN 600 MG PO TABS: 600.0000 mg | ORAL_TABLET | Freq: Three times a day (TID) | ORAL | 1 refills | Status: DC

## 2016-10-31 MED ORDER — PAROXETINE HCL 20 MG PO TABS: 20.0000 mg | ORAL_TABLET | Freq: Every day | ORAL | 1 refills | Status: DC

## 2016-10-31 NOTE — Telephone Encounter (Signed)
Rx done. 

## 2016-11-15 ENCOUNTER — Other Ambulatory Visit: Payer: Self-pay | Admitting: Nurse Practitioner

## 2016-11-18 ENCOUNTER — Other Ambulatory Visit: Payer: Self-pay | Admitting: Emergency Medicine

## 2016-12-02 ENCOUNTER — Ambulatory Visit: Payer: BLUE CROSS/BLUE SHIELD | Admitting: Nurse Practitioner

## 2016-12-13 ENCOUNTER — Ambulatory Visit (INDEPENDENT_AMBULATORY_CARE_PROVIDER_SITE_OTHER): Payer: BLUE CROSS/BLUE SHIELD | Admitting: Family Medicine

## 2016-12-13 ENCOUNTER — Encounter: Payer: Self-pay | Admitting: Family Medicine

## 2016-12-13 VITALS — BP 100/70 | HR 72 | Temp 98.1°F | Ht 63.0 in | Wt 109.2 lb

## 2016-12-13 DIAGNOSIS — B370 Candidal stomatitis: Secondary | ICD-10-CM | POA: Diagnosis not present

## 2016-12-13 DIAGNOSIS — L608 Other nail disorders: Secondary | ICD-10-CM | POA: Diagnosis not present

## 2016-12-13 DIAGNOSIS — H8110 Benign paroxysmal vertigo, unspecified ear: Secondary | ICD-10-CM | POA: Diagnosis not present

## 2016-12-13 MED ORDER — FLUCONAZOLE 150 MG PO TABS: 150.0000 mg | ORAL_TABLET | Freq: Every day | ORAL | 0 refills | Status: DC

## 2016-12-13 MED ORDER — MECLIZINE HCL 12.5 MG PO TABS: 12.5000 mg | ORAL_TABLET | Freq: Three times a day (TID) | ORAL | 0 refills | Status: DC | PRN

## 2016-12-13 NOTE — Progress Notes (Signed)
HPI:  Erin [name] is a pleasant 61 year old here for several issues. First will she think she has thrush on her tongue. She has had this before with stress of her illness. She reports she has had some stress and had a upper respiratory infection last few weeks. She is to brush her tongue with a toothbrush, but then stopped. No pain in the tongue or throat currently. She also has had a flare of her positional vertigo symptoms for the last week. She has had this number times in the past and responded well to home physical therapy treatments and meclizine. She requests a refill of meclizine. She has not tried home PT, but has the handout for this. She reports her husband can do these treatments as he did in the past. She feels these are exactly the same symptoms occurring briefly with movements of her head up and to the left. No weakness, numbness, headaches, fevers, malaise, vision changes or speech changes. She also has a nail that his thin looking abnormal to her on her left great toe. She has been treating it with antifungal cream and it seems to be improving or growing out. She wants to know about natural treatments.  ROS: See pertinent positives and negatives per HPI.  Past Medical History:  Diagnosis Date  . Allergy   . Alopecia   . 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 Anxiety   . B12 deficiency anemia 09/14/2015  . Blood transfusion without reported diagnosis   . Chronic back pain   . Chronic daily headache 03/29/2013   takes bc powder  . Closed right hip fracture ([place]) 09/10/2015  . Depression   . DM (diabetes mellitus) ([place])    diet controlled  . GERD (gastroesophageal reflux disease)   . HLD (hyperlipidemia)   . Hot flashes   . Hyperlipemia 04/11/2014  . Hypokalemia 05/2016  . Hypothyroidism 09/10/2015  . IBS (irritable bowel syndrome) 03/29/2013  . 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 Tubular  adenoma of colon 09/08/2003  . Vertigo     Past Surgical History:  Procedure Laterality Date  . COLONOSCOPY    . ECTOPIC PREGNANCY SURGERY    . FLEXIBLE SIGMOIDOSCOPY N/A 08/14/2013   Procedure: FLEXIBLE SIGMOIDOSCOPY;  Surgeon: Ladene Artist, MD;  [facility];  Service: Endoscopy;  Laterality: N/A;  . PILONIDAL CYST EXCISION    . POLYPECTOMY    . TONSILLECTOMY    . TOTAL HIP ARTHROPLASTY  09/11/2015   Procedure: TOTAL HIP ARTHROPLASTY;  Surgeon: Renette Butters, MD;  Location: [place];  Service: Orthopedics;;    Family History  Problem Relation Age of Onset  . Arthritis Mother   . Hyperlipidemia Mother   . Heart disease Mother   . Hypertension Mother   . Stroke Mother 64  . Irritable bowel syndrome Mother   . Thyroid disease Mother   . Heart disease Father   . Hyperlipidemia Father   . Hypertension Father   . Stroke Father 60  . Thyroid disease Father   . Prostate cancer Father   . Lung cancer Brother   . Colon cancer Neg Hx   . Rectal cancer Neg Hx   . Stomach cancer Neg Hx   . Heart attack Mother   . Heart attack Father   . Heart attack Maternal Grandfather   . Heart attack Maternal Uncle     Social History   Social History  .  Marital status: Married    Spouse name: N/A  . Number of children: 0  . Years of education: N/A   Occupational History  . caregiver    Social History Main Topics  . Smoking status: Former Smoker    Packs/day: 0.50    Types: Cigarettes    Quit date: 10/07/2014  . Smokeless tobacco: Never Used  . Alcohol use No  . Drug use: No  . Sexual activity: Yes    Partners: Male   Other Topics Concern  . None   Social History Narrative   Work or School: homemaker      Home Situation: lives with her husband,[name] takes care of her elderly parents       Spiritual Beliefs: Christian      Lifestyle: no regular exercise, poor diet                 Current Outpatient Prescriptions:  .  acyclovir ointment (ZOVIRAX) 5  %, Apply 1 application topically every 3 (three) hours., Disp: 15 g, Rfl: 1 .  dicyclomine (BENTYL) 10 MG capsule, Take 1 capsule (10 mg total) by mouth 3 (three) times daily before meals., Disp: 270 capsule, Rfl: 1 .  fluticasone (FLONASE) 50 MCG/ACT nasal spray, Place 1 spray into both nostrils daily., Disp: 48 g, Rfl: 1 .  gabapentin (NEURONTIN) 600 MG tablet, Take 1 tablet (600 mg total) by mouth 3 (three) times daily., Disp: 270 tablet, Rfl: 1 .  levothyroxine (SYNTHROID, LEVOTHROID) 75 MCG tablet, Take 1 tablet (75 mcg total) by mouth daily., Disp: 90 tablet, Rfl: 1 .  LINZESS 145 MCG CAPS capsule, TAKE ONE CAPSULE BY MOUTH EVERY DAY BEFORE BREAKFAST, Disp: 90 capsule, Rfl: 1 .  nystatin-triamcinolone (MYCOLOG II) cream, APPLY TO CORNERS OF THE MOUTH TWICE A DAY, Disp: , Rfl: 2 .  omeprazole (PRILOSEC) 40 MG capsule, Take 1 capsule (40 mg total) by mouth daily., Disp: 90 capsule, Rfl: 3 .  ondansetron (ZOFRAN) 4 MG tablet, Take 1 tablet (4 mg total) by mouth every 8 (eight) hours as needed for nausea or vomiting., Disp: 30 tablet, Rfl: 0 .  PARoxetine (PAXIL) 20 MG tablet, Take 1 tablet (20 mg total) by mouth daily., Disp: 90 tablet, Rfl: 1 .  Potassium Chloride ER 20 MEQ TBCR, Take 20 mEq by mouth daily., Disp: 90 tablet, Rfl: 0 .  vitamin B-12 1000 MCG tablet, Take 1 tablet (1,000 mcg total) by mouth daily., Disp: 30 tablet, Rfl: 0 .  fluconazole (DIFLUCAN) 150 MG tablet, Take 1 tablet (150 mg total) by mouth daily., Disp: 3 tablet, Rfl: 0 .  meclizine (ANTIVERT) 12.5 MG tablet, Take 1 tablet (12.5 mg total) by mouth 3 (three) times daily as needed for dizziness., Disp: 30 tablet, Rfl: 0  EXAM:  Vitals:   12/13/16 1025  BP: 100/70  Pulse: 72  Temp: 98.1 F (36.7 C)    Body mass index is 19.34 kg/m.  GENERAL: vitals reviewed and listed above, alert, oriented, appears well hydrated and in no acute distress  HEENT: atraumatic, conjunttiva clear, no obvious abnormalities on  inspection of external nose and ears,normal appearance of ear canals and TMs, mild white discoloration tongue, clear nasal congestion, mild post oropharyngeal erythema with PND, no tonsillar edema or exudate, no sinus TTP  NECK: no obvious masses on inspection, no bruit  LUNGS: clear to auscultation bilaterally, no wheezes, rales or rhonchi, good air movement  CV: HRRR, no peripheral edema  SKIN: mild thickening, discoloration and friability L great  toe nail  MS: moves all extremities without noticeable abnormality  PSYCH: pleasant and cooperative, no obvious depression or anxiety, CN II-XII grossly intact, finger to nose normal, gait normal, symptoms with L sided testing  ASSESSMENT AND PLAN:  Discussed the following assessment and plan:  Benign paroxysmal positional vertigo, unspecified laterality -home vestibular rehab, meclizine -follow up 1 week if persists  Thrush -diflucan, tongue cleaning  Toenail deformity -topical antifungal, acv, keep nail trim but don't file -follow up if blue green or worsening or not growing out  -Patient advised to return or notify a doctor immediately if symptoms worsen or persist or new concerns arise.  Patient Instructions  Keep scheduled follow up appointment.  Do the home exercises for treatment of the vertigo and use the meclizine as needed. Follow up if symptoms worsen or do not resolve in 1 week.  Use the medication for the thrush, diflucan.  Apple cider vinegar for the nail. Fungal cream and keep nail trim. Follow up if persists in 3 months or sooner if worsening or changes color.   Colin Benton R., DO

## 2016-12-13 NOTE — Progress Notes (Signed)
Pre visit review using our clinic review tool, if applicable. No additional management support is needed unless otherwise documented below in the visit note. 

## 2016-12-13 NOTE — Patient Instructions (Signed)
Keep scheduled follow up appointment.  Do the home exercises for treatment of the vertigo and use the meclizine as needed. Follow up if symptoms worsen or do not resolve in 1 week.  Use the medication for the thrush, diflucan.  Apple cider vinegar for the nail. Fungal cream and keep nail trim. Follow up if persists in 3 months or sooner if worsening or changes color.

## 2016-12-26 ENCOUNTER — Ambulatory Visit (INDEPENDENT_AMBULATORY_CARE_PROVIDER_SITE_OTHER): Payer: BLUE CROSS/BLUE SHIELD | Admitting: Family Medicine

## 2016-12-26 ENCOUNTER — Encounter: Payer: Self-pay | Admitting: Family Medicine

## 2016-12-26 VITALS — BP 102/76 | HR 94 | Temp 98.0°F | Ht 63.0 in | Wt 110.7 lb

## 2016-12-26 DIAGNOSIS — B9789 Other viral agents as the cause of diseases classified elsewhere: Secondary | ICD-10-CM | POA: Diagnosis not present

## 2016-12-26 DIAGNOSIS — J069 Acute upper respiratory infection, unspecified: Secondary | ICD-10-CM

## 2016-12-26 MED ORDER — BENZONATATE 100 MG PO CAPS: 100.0000 mg | ORAL_CAPSULE | Freq: Two times a day (BID) | ORAL | 0 refills | Status: DC | PRN

## 2016-12-26 NOTE — Progress Notes (Signed)
Pre visit review using our clinic review tool, if applicable. No additional management support is needed unless otherwise documented below in the visit note. 

## 2016-12-26 NOTE — Progress Notes (Signed)
HPI:  URI: -started: 4 days ago -symptoms:nasal congestion, sore throat, cough -denies:fever, SOB, NVD, tooth pain, body aches -has tried: musinex and gargling which helps -sick contacts/travel/risks: no reported flu, strep or tick exposure  ROS: See pertinent positives and negatives per HPI.  Past Medical History:  Diagnosis Date  . Allergy   . Alopecia   . Anal cancer ([place]) 08/14/13   invasive squamous cell ca, s/p radiation 10/20-11/26/14 60.4Gy/28fx and chemo  . Anxiety   . B12 deficiency anemia 09/14/2015  . Blood transfusion without reported diagnosis   . Chronic back pain   . Chronic daily headache 03/29/2013   takes bc powder  . Closed right hip fracture ([place]) 09/10/2015  . Depression   . DM (diabetes mellitus) ([place])    diet controlled  . GERD (gastroesophageal reflux disease)   . HLD (hyperlipidemia)   . Hot flashes   . Hyperlipemia 04/11/2014  . Hypokalemia 05/2016  . Hypothyroidism 09/10/2015  . IBS (irritable bowel syndrome) 03/29/2013  . 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 Tubular adenoma of colon 09/08/2003  . Vertigo     Past Surgical History:  Procedure Laterality Date  . COLONOSCOPY    . ECTOPIC PREGNANCY SURGERY    . FLEXIBLE SIGMOIDOSCOPY N/A 08/14/2013   Procedure: FLEXIBLE SIGMOIDOSCOPY;  Surgeon: Ladene Artist, MD;  Location: [facility];  Service: Endoscopy;  Laterality: N/A;  . PILONIDAL CYST EXCISION    . POLYPECTOMY    . TONSILLECTOMY    . TOTAL HIP ARTHROPLASTY  09/11/2015   Procedure: TOTAL HIP ARTHROPLASTY;  Surgeon: Renette Butters, MD;  Location: [facility];  Service: Orthopedics;;    Family History  Problem Relation Age of Onset  . Arthritis Mother   . Hyperlipidemia Mother   . Heart disease Mother   . Hypertension Mother   . Stroke Mother 4  . Irritable bowel syndrome Mother   . Thyroid disease Mother   . Heart disease Father   . Hyperlipidemia Father   . Hypertension Father   . Stroke Father 11  . Thyroid disease  Father   . Prostate cancer Father   . Lung cancer Brother   . Colon cancer Neg Hx   . Rectal cancer Neg Hx   . Stomach cancer Neg Hx   . Heart attack Mother   . Heart attack Father   . Heart attack Maternal Grandfather   . Heart attack Maternal Uncle     Social History   Social History  . Marital status: Married    Spouse name: N/A  . Number of children: 0  . Years of education: N/A   Occupational History  . caregiver    Social History Main Topics  . Smoking status: Former Smoker    Packs/day: 0.50    Types: Cigarettes    Quit date: 10/07/2014  . Smokeless tobacco: Never Used  . Alcohol use No  . Drug use: No  . Sexual activity: Yes    Partners: Male   Other Topics Concern  . None   Social History Narrative   Work or School: homemaker      Home Situation: lives with her husband,[name] takes care of her elderly parents       Spiritual Beliefs: Christian      Lifestyle: no regular exercise, poor diet                 Current Outpatient Prescriptions:  .  acyclovir ointment (ZOVIRAX) 5 %, Apply 1 application  topically every 3 (three) hours., Disp: 15 g, Rfl: 1 .  dicyclomine (BENTYL) 10 MG capsule, Take 1 capsule (10 mg total) by mouth 3 (three) times daily before meals., Disp: 270 capsule, Rfl: 1 .  fluconazole (DIFLUCAN) 150 MG tablet, Take 1 tablet (150 mg total) by mouth daily., Disp: 3 tablet, Rfl: 0 .  fluticasone (FLONASE) 50 MCG/ACT nasal spray, Place 1 spray into both nostrils daily., Disp: 48 g, Rfl: 1 .  gabapentin (NEURONTIN) 600 MG tablet, Take 1 tablet (600 mg total) by mouth 3 (three) times daily., Disp: 270 tablet, Rfl: 1 .  levothyroxine (SYNTHROID, LEVOTHROID) 75 MCG tablet, Take 1 tablet (75 mcg total) by mouth daily., Disp: 90 tablet, Rfl: 1 .  LINZESS 145 MCG CAPS capsule, TAKE ONE CAPSULE BY MOUTH EVERY DAY BEFORE BREAKFAST, Disp: 90 capsule, Rfl: 1 .  meclizine (ANTIVERT) 12.5 MG tablet, Take 1 tablet (12.5 mg total) by mouth 3 (three)  times daily as needed for dizziness., Disp: 30 tablet, Rfl: 0 .  nystatin-triamcinolone (MYCOLOG II) cream, APPLY TO CORNERS OF THE MOUTH TWICE A DAY, Disp: , Rfl: 2 .  omeprazole (PRILOSEC) 40 MG capsule, Take 1 capsule (40 mg total) by mouth daily., Disp: 90 capsule, Rfl: 3 .  ondansetron (ZOFRAN) 4 MG tablet, Take 1 tablet (4 mg total) by mouth every 8 (eight) hours as needed for nausea or vomiting., Disp: 30 tablet, Rfl: 0 .  PARoxetine (PAXIL) 20 MG tablet, Take 1 tablet (20 mg total) by mouth daily., Disp: 90 tablet, Rfl: 1 .  Potassium Chloride ER 20 MEQ TBCR, Take 20 mEq by mouth daily., Disp: 90 tablet, Rfl: 0 .  vitamin B-12 1000 MCG tablet, Take 1 tablet (1,000 mcg total) by mouth daily., Disp: 30 tablet, Rfl: 0 .  benzonatate (TESSALON) 100 MG capsule, Take 1 capsule (100 mg total) by mouth 2 (two) times daily as needed for cough., Disp: 20 capsule, Rfl: 0  EXAM:  Vitals:   12/26/16 1615  BP: 102/76  Pulse: 94  Temp: 98 F (36.7 C)    Body mass index is 19.61 kg/m.  GENERAL: vitals reviewed and listed above, alert, oriented, appears well hydrated and in no acute distress  HEENT: atraumatic, conjunttiva clear, no obvious abnormalities on inspection of external nose and ears, normal appearance of ear canals and TMs, clear nasal congestion, mild post oropharyngeal erythema with PND, no tonsillar edema or exudate, no sinus TTP  NECK: no obvious masses on inspection  LUNGS: clear to auscultation bilaterally, no wheezes, rales or rhonchi, good air movement  CV: HRRR, no peripheral edema  MS: moves all extremities without noticeable abnormality  PSYCH: pleasant and cooperative, no obvious depression or anxiety  ASSESSMENT AND PLAN:  Discussed the following assessment and plan:  Viral upper respiratory tract infection  -given HPI and exam findings today, a serious infection or illness is unlikely. We discussed potential etiologies, with VURI being most likely, and  advised supportive care and monitoring. Tessalon for cough. We discussed treatment side effects, likely course, antibiotic misuse, transmission, potential complications, return precautions and signs of developing a serious illness. -of course, we advised to return or notify a doctor immediately if symptoms worsen or persist or new concerns arise.  Declined AVS.   There are no Patient Instructions on file for this visit.  Colin Benton R., DO

## 2017-01-17 ENCOUNTER — Other Ambulatory Visit: Payer: Self-pay | Admitting: Gastroenterology

## 2017-01-17 ENCOUNTER — Other Ambulatory Visit: Payer: Self-pay | Admitting: Family Medicine

## 2017-01-17 DIAGNOSIS — K2970 Gastritis, unspecified, without bleeding: Secondary | ICD-10-CM

## 2017-01-17 DIAGNOSIS — R112 Nausea with vomiting, unspecified: Secondary | ICD-10-CM

## 2017-01-30 ENCOUNTER — Ambulatory Visit: Payer: BLUE CROSS/BLUE SHIELD | Admitting: Family Medicine

## 2017-02-07 ENCOUNTER — Other Ambulatory Visit: Payer: Self-pay | Admitting: Family Medicine

## 2017-02-24 ENCOUNTER — Ambulatory Visit: Payer: BLUE CROSS/BLUE SHIELD | Admitting: Neurology

## 2017-03-31 ENCOUNTER — Ambulatory Visit (HOSPITAL_BASED_OUTPATIENT_CLINIC_OR_DEPARTMENT_OTHER): Payer: BLUE CROSS/BLUE SHIELD | Admitting: Nurse Practitioner

## 2017-03-31 ENCOUNTER — Telehealth: Payer: Self-pay | Admitting: Oncology

## 2017-03-31 VITALS — BP 140/67 | HR 87 | Temp 98.9°F | Resp 18 | Ht 63.0 in | Wt 107.4 lb

## 2017-03-31 DIAGNOSIS — C210 Malignant neoplasm of anus, unspecified: Secondary | ICD-10-CM

## 2017-03-31 DIAGNOSIS — Z85048 Personal history of other malignant neoplasm of rectum, rectosigmoid junction, and anus: Secondary | ICD-10-CM | POA: Diagnosis not present

## 2017-03-31 DIAGNOSIS — E538 Deficiency of other specified B group vitamins: Secondary | ICD-10-CM | POA: Diagnosis not present

## 2017-03-31 DIAGNOSIS — E785 Hyperlipidemia, unspecified: Secondary | ICD-10-CM | POA: Diagnosis not present

## 2017-03-31 DIAGNOSIS — Z87891 Personal history of nicotine dependence: Secondary | ICD-10-CM | POA: Diagnosis not present

## 2017-03-31 DIAGNOSIS — E119 Type 2 diabetes mellitus without complications: Secondary | ICD-10-CM

## 2017-03-31 NOTE — Telephone Encounter (Signed)
Gave patient AVS and calender per 5/11 los. Per [facility] - someone from their office will contact patient with referral .

## 2017-03-31 NOTE — Progress Notes (Signed)
  [facility] [place] OFFICE PROGRESS NOTE   Diagnosis:  Anal cancer  INTERVAL HISTORY:   Erin [name] returns as scheduled. She feels well. No change in bowel habits. No rectal bleeding.  Objective:  Vital signs in last 24 hours:  Blood pressure 140/67, pulse 87, temperature 98.9 F (37.2 C), temperature source Oral, resp. rate 18, height 5\' 3"  (1.6 m), weight 107 lb 6.4 oz (48.7 kg), SpO2 100 %.    HEENT: Neck without mass. Lymphatics: No palpable cervical, supraclavicular, axillary or inguinal lymph nodes. Resp: Lungs clear bilaterally. Cardio: Regular rate and rhythm. GI: Abdomen soft and nontender. No hepatomegaly. Vascular: No leg edema. Rectal: Small external hemorrhoid. No mass. Skin: Radiation skin change perineum/perianal region.   Lab Results:  Lab Results  Component Value Date   WBC 5.6 06/27/2016   HGB 12.5 06/27/2016   HCT 37.0 06/27/2016   MCV 89.1 06/27/2016   PLT 353.0 06/27/2016   NEUTROABS 4.2 06/27/2016    Imaging:  No results found.  Medications: I have reviewed the patient's current medications.  Assessment/Plan: 1. Squamous cell carcinoma of the anus, clinical stage II (T2 N0), status post endoscopic biopsy 08/14/2013 confirming invasive squamous cell carcinoma. Initiation of radiation and cycle 1 5-FU/mitomycin C. 09/09/2013. Cycle 2 5-FU/mitomycin-C (dose reduction) completed beginning 10/08/2013, radiation completed 10/16/2013. 2. Rectal pain, constipation and bleeding secondary to #1. Resolved. 3. History of Iron deficiency anemia. 4. Vitamin B12 deficiency. 5. Diabetes. 6. Hyperlipidemia. 7. Neurodermatitis. 8. Tobacco use. She reports she has quit smoking. 9. Mucositis following cycle 1 and cycle 2 5-FU/mitomycin C.  10. Skin rash. Initially felt to be related to the chemotherapy though somewhat atypical in appearance and distribution. The rash has resolved. 11. Positional vertigo reported when here 10/25/2013-she reported  this to be a chronic problem.  12. History of hypokalemia. 13. Status post sigmoidoscopy 03/10/2014 with findings of distal rectal/anal canal nodularity measuring 2 cm x 2 cm. Biopsy showed benign ulcerated anorectal junction mucosa and inflammatory debris. Within the debris there were markedly atypical epithelial cell fragments and single cells. 14. Status post sigmoidoscopy 09/23/2014. Area of abnormal mucosa in the distal rectum/anal canal-friable, erythematous, scarring; multiple biopsies performed. Pathology revealed no dysplasia or malignancy. 15. Bilateral hip pain. Status post CT abdomen/pelvis 08/11/2014 with no etiology for the pain identified. Resolved after physical therapy. 16. Right hip fracture following a fall 09/10/2015 status post total hip arthroplasty 09/11/2015   Disposition: Erin [name] remains in clinical remission from anal cancer. She will return for a follow-up visit in 8 months. We made a referral to Dr. Fuller Plan for timing of the next anoscopy/sigmoidoscopy.  Plan reviewed with Dr. Benay Spice.  [name] ANP/GNP-BC   03/31/2017  11:40 AM

## 2017-04-09 ENCOUNTER — Other Ambulatory Visit: Payer: Self-pay | Admitting: Gastroenterology

## 2017-04-09 DIAGNOSIS — R112 Nausea with vomiting, unspecified: Secondary | ICD-10-CM

## 2017-04-09 DIAGNOSIS — K2970 Gastritis, unspecified, without bleeding: Secondary | ICD-10-CM

## 2017-04-14 ENCOUNTER — Other Ambulatory Visit: Payer: Self-pay | Admitting: Family Medicine

## 2017-04-21 ENCOUNTER — Ambulatory Visit: Payer: BLUE CROSS/BLUE SHIELD | Admitting: Family Medicine

## 2017-05-10 NOTE — Progress Notes (Signed)
HPI:  Erin [name] is a pleasant 61 y.o. here for follow up. Chronic medical problems summarized below were reviewed for changes and stability and were updated as needed below. She has been taking care of her parents. She reports is doing fairly well, but has not had time to follow some health recs. 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 She require a wheechair parking decal as at times ambulation for distances is tough. She is on linzess for IBS with GI, which has really helped, but has lost weight since starting. Reports has GI follow up set up with sigmoid. Denies CP, SOB, vision changes, DOE, treatment intolerance or new symptoms.  Due for labs: hgba1c, tsh, bmp, b12, ? Lipid clinic, foot exam, eye exam, urine micro  Diet controlled diabetes: - dx in 2014, diet controlled since -nail deformity L great toenail - for 2 years, unchanged  HLD: -referred to lipid clinic - has not gone -meds: did not take  Hypothyroidsim: -meds: levothyroxine 75 mcg  Anxiety and Depression -no meds - reports mood is good  Hx Anal Ca, IBS, chronic nausea and vomiting: -sees GI and oncology for management -meds: bentyl, linzess, omeprazole, zofran  neurodermatitis/vertigo/frequent headaches: -meds: gabapentin, meclizine prn  B12 def/intermittently low potassium: -meds: K dur, b12  Chronic back pain/gait issues/Sacroiliitis/neuropathy: -sees ortho - [facility]   ROS: See pertinent positives and negatives per HPI.  Past Medical History:  Diagnosis Date  . Allergy   . Alopecia   . Anal cancer ([place]) 08/14/13   invasive squamous cell ca, s/p radiation 10/20-11/26/14 60.4Gy/42fx and chemo  . Anxiety   . B12 deficiency anemia 09/14/2015  . Blood transfusion without reported diagnosis   . Chronic back pain   . Chronic daily headache 03/29/2013   takes bc powder  . Closed right hip fracture ([place])  09/10/2015  . Depression   . DM (diabetes mellitus) ([place])    diet controlled  . GERD (gastroesophageal reflux disease)   . HLD (hyperlipidemia)   . Hot flashes   . Hyperlipemia 04/11/2014  . Hypokalemia 05/2016  . Hypothyroidism 09/10/2015  . IBS (irritable bowel syndrome) 03/29/2013  . 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 Tubular adenoma of colon 09/08/2003  . Vertigo     Past Surgical History:  Procedure Laterality Date  . COLONOSCOPY    . ECTOPIC PREGNANCY SURGERY    . FLEXIBLE SIGMOIDOSCOPY N/A 08/14/2013   Procedure: FLEXIBLE SIGMOIDOSCOPY;  Surgeon: Ladene Artist, MD;  [facility];  Service: Endoscopy;  Laterality: N/A;  . PILONIDAL CYST EXCISION    . POLYPECTOMY    . TONSILLECTOMY    . TOTAL HIP ARTHROPLASTY  09/11/2015   Procedure: TOTAL HIP ARTHROPLASTY;  Surgeon: Renette Butters, MD;  Location: [place];  Service: Orthopedics;;    Family History  Problem Relation Age of Onset  . Arthritis Mother   . Hyperlipidemia Mother   . Heart disease Mother   . Hypertension Mother   . Stroke Mother 75  . Irritable bowel syndrome Mother   . Thyroid disease Mother   . Heart disease Father   . Hyperlipidemia Father   . Hypertension Father   . Stroke Father 89  . Thyroid disease Father   . Prostate cancer Father   . Lung cancer Brother   . Colon cancer Neg Hx   . Rectal cancer Neg Hx   .  Stomach cancer Neg Hx   . Heart attack Mother   . Heart attack Father   . Heart attack Maternal Grandfather   . Heart attack Maternal Uncle     Social History   Social History  . Marital status: Married    Spouse name: N/A  . Number of children: 0  . Years of education: N/A   Occupational History  . caregiver    Social History Main Topics  . Smoking status: Former Smoker    Packs/day: 0.50    Types: Cigarettes    Quit date: 10/07/2014  . Smokeless tobacco: Never Used  . Alcohol use No  . Drug use: No  . Sexual activity: Yes    Partners: Male   Other  Topics Concern  . None   Social History Narrative   Work or School: homemaker      Home Situation: lives with her husband,[name] takes care of her elderly parents       Spiritual Beliefs: Christian      Lifestyle: no regular exercise, poor diet                 Current Outpatient Prescriptions:  .  acyclovir ointment (ZOVIRAX) 5 %, Apply 1 application topically every 3 (three) hours., Disp: 15 g, Rfl: 1 .  dicyclomine (BENTYL) 10 MG capsule, TAKE ONE CAPSULE BY MOUTH THREE TIMES DAILY BEFORE MEALS. DUE FOR FOLLW UP IN MAY, NEEDS TO SCHEDULE APPOINTMENT, Disp: 270 capsule, Rfl: 0 .  fluticasone (FLONASE) 50 MCG/ACT nasal spray, USE 1 SPRAY IN EACH NOSTRIL DAILY, Disp: 16 g, Rfl: 5 .  gabapentin (NEURONTIN) 600 MG tablet, TAKE 1 TABLET BY MOUTH THREE TIMES DAILY GENERIC EQUIVALENT FOR NEURONTIN, Disp: 270 tablet, Rfl: 1 .  levothyroxine (SYNTHROID, LEVOTHROID) 75 MCG tablet, TAKE 1 TABLET BY MOUTH DAILY GENERIC EQUIVALENT FOR SYNTHROID, Disp: 90 tablet, Rfl: 1 .  LINZESS 145 MCG CAPS capsule, TAKE ONE CAPSULE BY MOUTH DAILY BEFORE BREAKFAST, Disp: 90 capsule, Rfl: 0 .  meclizine (ANTIVERT) 12.5 MG tablet, Take 1 tablet (12.5 mg total) by mouth 3 (three) times daily as needed for dizziness., Disp: 30 tablet, Rfl: 0 .  nystatin-triamcinolone (MYCOLOG II) cream, APPLY TO CORNERS OF THE MOUTH TWICE A DAY, Disp: , Rfl: 2 .  omeprazole (PRILOSEC) 40 MG capsule, TAKE ONE CAPSULE BY MOUTH DAILY, Disp: 90 capsule, Rfl: 0 .  potassium chloride SA (K-DUR,KLOR-CON) 20 MEQ tablet, TAKE 1 TABLET BY MOUTH DAILY, Disp: 90 tablet, Rfl: 1 .  vitamin B-12 1000 MCG tablet, Take 1 tablet (1,000 mcg total) by mouth daily., Disp: 30 tablet, Rfl: 0 .  ondansetron (ZOFRAN) 4 MG tablet, Take 1 tablet (4 mg total) by mouth every 8 (eight) hours as needed for nausea or vomiting. (Patient not taking: Reported on 05/11/2017), Disp: 30 tablet, Rfl: 0  EXAM:  Vitals:   05/11/17 0932  BP: 140/82  Temp: 98.2 F  (36.8 C)    Body mass index is 18.6 kg/m.  GENERAL: vitals reviewed and listed above, alert, oriented, appears well hydrated and in no acute distress  HEENT: atraumatic, conjunttiva clear, no obvious abnormalities on inspection of external nose and ears  NECK: no obvious masses on inspection  LUNGS: clear to auscultation bilaterally, no wheezes, rales or rhonchi, good air movement  CV: HRRR, no peripheral edema  MS: moves all extremities without noticeable abnormality  PSYCH: pleasant and cooperative, no obvious depression or anxiety  ASSESSMENT AND PLAN:  Discussed the following assessment and plan:  Type  2 diabetes mellitus without complication, without long-term current use of insulin ([place]) - Plan: Hemoglobin A1c, Microalbumin / creatinine urine ratio, Ambulatory referral to Podiatry  Hyperlipidemia associated with type 2 diabetes mellitus ([place]) - Plan: HDL cholesterol, Cholesterol, total  Hypothyroidism, unspecified type - Plan: TSH  Irritable bowel syndrome, unspecified type  Nausea and vomiting, intractability of vomiting not specified, unspecified vomiting type  Neuropathy  Sacroiliitis ([place])  Hypokalemia - Plan: Basic metabolic panel  T65 deficiency - Plan: Vitamin B12  Toenail deformity - Plan: Ambulatory referral to Podiatry  -foot exam done - still with deformity L great toe - opted for podiatry eval -labs today -lifestyle recs -she may consider osteopathic manipulative therapy for the gait/balance issues -reports had eye exam  -follow up 3 months -Patient advised to return or notify a doctor immediately if symptoms worsen or persist or new concerns arise.  Patient Instructions  BEFORE YOU LEAVE: -follow up: 3 months -labs  -Call your insurance to see if they cover Osteopathic Manipulative Medicine - if so, consider scheduling appointment for your leg and gait issues. Ask for 30 minute appointment for OMT at beginning or end of Dr. Julianne Rice  schedule.  -We placed a referral for you as discussed to the foot doctor about the toenail. It usually takes about 1-2 weeks to process and schedule this referral. If you have not heard from [place] regarding this appointment in 2 weeks please contact our office.  We have ordered labs or studies at this visit. It can take up to 1-2 weeks for results and processing. IF results require follow up or explanation, we will call you with instructions. Clinically stable results will be released to your [facility]. If you have not heard from [place] or cannot find your results in [facility] in 2 weeks please contact our office at [phone].  If you are not yet signed up for [facility], please consider signing up.            Colin Benton R., DO

## 2017-05-11 ENCOUNTER — Encounter: Payer: Self-pay | Admitting: Family Medicine

## 2017-05-11 ENCOUNTER — Ambulatory Visit (INDEPENDENT_AMBULATORY_CARE_PROVIDER_SITE_OTHER): Payer: BLUE CROSS/BLUE SHIELD | Admitting: Family Medicine

## 2017-05-11 VITALS — BP 140/82 | Temp 98.2°F | Wt 105.0 lb

## 2017-05-11 DIAGNOSIS — E119 Type 2 diabetes mellitus without complications: Secondary | ICD-10-CM | POA: Diagnosis not present

## 2017-05-11 DIAGNOSIS — E785 Hyperlipidemia, unspecified: Secondary | ICD-10-CM

## 2017-05-11 DIAGNOSIS — G629 Polyneuropathy, unspecified: Secondary | ICD-10-CM

## 2017-05-11 DIAGNOSIS — E538 Deficiency of other specified B group vitamins: Secondary | ICD-10-CM | POA: Diagnosis not present

## 2017-05-11 DIAGNOSIS — E876 Hypokalemia: Secondary | ICD-10-CM

## 2017-05-11 DIAGNOSIS — E1169 Type 2 diabetes mellitus with other specified complication: Secondary | ICD-10-CM

## 2017-05-11 DIAGNOSIS — K589 Irritable bowel syndrome without diarrhea: Secondary | ICD-10-CM

## 2017-05-11 DIAGNOSIS — M461 Sacroiliitis, not elsewhere classified: Secondary | ICD-10-CM

## 2017-05-11 DIAGNOSIS — R112 Nausea with vomiting, unspecified: Secondary | ICD-10-CM

## 2017-05-11 DIAGNOSIS — E039 Hypothyroidism, unspecified: Secondary | ICD-10-CM | POA: Diagnosis not present

## 2017-05-11 DIAGNOSIS — L608 Other nail disorders: Secondary | ICD-10-CM

## 2017-05-11 HISTORY — DX: Type 2 diabetes mellitus with other specified complication: E11.69

## 2017-05-11 LAB — MICROALBUMIN / CREATININE URINE RATIO
Creatinine,U: 32.5 mg/dL
Microalb Creat Ratio: 2.2 mg/g (ref 0.0–30.0)
Microalb, Ur: <0.7 mg/dL (ref 0.0–1.9)

## 2017-05-11 LAB — BASIC METABOLIC PANEL
BUN: 18 mg/dL (ref 6–23)
CHLORIDE: 100 meq/L (ref 96–112)
CO2: 27 meq/L (ref 19–32)
Calcium: 9.6 mg/dL (ref 8.4–10.5)
Creatinine, Ser: 0.75 mg/dL (ref 0.40–1.20)
GFR: 83.58 mL/min (ref >60.00)
GLUCOSE: 95 mg/dL (ref 70–99)
Potassium: 4.2 mEq/L (ref 3.5–5.1)
Sodium: 135 mEq/L (ref 135–145)

## 2017-05-11 LAB — HEMOGLOBIN A1C: Hgb A1c MFr Bld: 5.9 % (ref 4.6–6.5)

## 2017-05-11 LAB — TSH: TSH: 2.34 u[IU]/mL (ref 0.35–4.50)

## 2017-05-11 LAB — VITAMIN B12: Vitamin B-12: 1034 pg/mL — ABNORMAL HIGH (ref 211–911)

## 2017-05-11 LAB — CHOLESTEROL, TOTAL: Cholesterol: 307 mg/dL — ABNORMAL HIGH (ref 0–200)

## 2017-05-11 LAB — HDL CHOLESTEROL: HDL: 44.7 mg/dL (ref >39.00)

## 2017-05-11 NOTE — Patient Instructions (Addendum)
BEFORE YOU LEAVE: -follow up: 3 months -labs  -Call your insurance to see if they cover Osteopathic Manipulative Medicine - if so, consider scheduling appointment for your leg and gait issues. Ask for 30 minute appointment for OMT at beginning or end of Dr. Julianne Rice schedule.  -We placed a referral for you as discussed to the foot doctor about the toenail. It usually takes about 1-2 weeks to process and schedule this referral. If you have not heard from [place] regarding this appointment in 2 weeks please contact our office.  We have ordered labs or studies at this visit. It can take up to 1-2 weeks for results and processing. IF results require follow up or explanation, we will call you with instructions. Clinically stable results will be released to your [facility]. If you have not heard from [place] or cannot find your results in [facility] in 2 weeks please contact our office at [phone].  If you are not yet signed up for [facility], please consider signing up.

## 2017-05-29 ENCOUNTER — Encounter: Payer: Self-pay | Admitting: Gastroenterology

## 2017-05-29 ENCOUNTER — Ambulatory Visit (INDEPENDENT_AMBULATORY_CARE_PROVIDER_SITE_OTHER): Payer: BLUE CROSS/BLUE SHIELD | Admitting: Gastroenterology

## 2017-05-29 VITALS — BP 114/62 | HR 62 | Ht 63.0 in | Wt 103.2 lb

## 2017-05-29 DIAGNOSIS — Z85048 Personal history of other malignant neoplasm of rectum, rectosigmoid junction, and anus: Secondary | ICD-10-CM

## 2017-05-29 DIAGNOSIS — K581 Irritable bowel syndrome with constipation: Secondary | ICD-10-CM | POA: Diagnosis not present

## 2017-05-29 DIAGNOSIS — Z86010 Personal history of colonic polyps: Secondary | ICD-10-CM | POA: Diagnosis not present

## 2017-05-29 NOTE — Patient Instructions (Signed)
You will be due for a recall colonoscopy in 07/2018. We will send you a reminder in the mail when it gets closer to that time.   Thank you for choosing me and [facility].  Erin [name]. Erin [name]., MD., [name]

## 2017-05-29 NOTE — Progress Notes (Signed)
    History of Present Illness: This is a 61 year old female with a history of anal cancer, a history of adenomatous polyps and constipation. She is accompanied by her husband. She is felt to be in remission for her anal cancer. Her constipation is well controlled on daily Linzess. She was recently seen in oncology and a DRE was unremarkable. Other than well controlled IBS-C she has no other colorectal complaints.  Current Medications, Allergies, Past Medical History, Past Surgical History, Family History and Social History were reviewed in [employer] record.  Physical Exam: General: Well developed, well nourished, no acute distress Head: Normocephalic and atraumatic Eyes:  sclerae anicteric, EOMI Ears: Normal auditory acuity Mouth: No deformity or lesions Lungs: Clear throughout to auscultation Heart: Regular rate and rhythm; no murmurs, rubs or bruits Abdomen: Soft, non tender and non distended. No masses, hepatosplenomegaly or hernias noted. Normal Bowel sounds Rectal: DRE performed at oncology appt was negative, not repeated per patient request.  Musculoskeletal: Symmetrical with no gross deformities  Pulses:  Normal pulses noted Extremities: No clubbing, cyanosis, edema or deformities noted Neurological: Alert oriented x 4, grossly nonfocal Psychological:  Alert and cooperative. Normal mood and affect  Assessment and Recommendations:  1.  History of anal cancer and history of adenomatous colon polyps. Flexible sigmoidoscopy in November 2016 was unremarkable. Colonoscopy recommended in September 2019.  2. IBS-C. Continue Linzess 145 mcg daily and dicyclomine 10 mg tid ac.   I spent 15 minutes of face-to-face time with the patient. Greater than 50% of the time was spent counseling and coordinating care.

## 2017-06-13 ENCOUNTER — Encounter: Payer: Self-pay | Admitting: Family Medicine

## 2017-06-15 ENCOUNTER — Other Ambulatory Visit: Payer: Self-pay | Admitting: *Deleted

## 2017-06-15 MED ORDER — EZETIMIBE 10 MG PO TABS: 10.0000 mg | ORAL_TABLET | Freq: Every day | ORAL | 3 refills | Status: DC

## 2017-06-15 NOTE — Telephone Encounter (Signed)
Rx done and the pt was notified via Mychart message. 

## 2017-07-01 ENCOUNTER — Other Ambulatory Visit: Payer: Self-pay | Admitting: Gastroenterology

## 2017-07-01 DIAGNOSIS — K2970 Gastritis, unspecified, without bleeding: Secondary | ICD-10-CM

## 2017-07-01 DIAGNOSIS — R112 Nausea with vomiting, unspecified: Secondary | ICD-10-CM

## 2017-07-03 ENCOUNTER — Ambulatory Visit (INDEPENDENT_AMBULATORY_CARE_PROVIDER_SITE_OTHER): Payer: BLUE CROSS/BLUE SHIELD | Admitting: Podiatry

## 2017-07-03 ENCOUNTER — Encounter: Payer: Self-pay | Admitting: Podiatry

## 2017-07-03 VITALS — BP 139/91 | HR 76

## 2017-07-03 DIAGNOSIS — M79675 Pain in left toe(s): Secondary | ICD-10-CM | POA: Diagnosis not present

## 2017-07-03 DIAGNOSIS — M79674 Pain in right toe(s): Secondary | ICD-10-CM | POA: Diagnosis not present

## 2017-07-03 DIAGNOSIS — B351 Tinea unguium: Secondary | ICD-10-CM

## 2017-07-03 DIAGNOSIS — I999 Unspecified disorder of circulatory system: Secondary | ICD-10-CM

## 2017-07-03 NOTE — Progress Notes (Signed)
   Subjective:    Patient ID: Erin [name], female    DOB: 02/24/1956, 61 y.o.   MRN: [id number]  HPI  Chief Complaint  Patient presents with  . Nail Problem    Discolored nails B/L big toe onset 2 yrs       Review of Systems  Constitutional: Positive for appetite change.  Eyes: Positive for visual disturbance.  Cardiovascular:       Calf pain with walking   Endocrine: Positive for cold intolerance.  Musculoskeletal: Positive for back pain, gait problem and myalgias.  Skin:       Change in nails  Neurological: Positive for dizziness, weakness, light-headedness, numbness and headaches.       Objective:   Physical Exam        Assessment & Plan:

## 2017-07-05 NOTE — Progress Notes (Signed)
Subjective:    Patient ID: Erin [name], female   DOB: 61 y.o.   MRN: [id number]   HPI patient states that she has discoloration in her big toenails bilateral and also seems to get cramps in her legs and has had a history of vascular problems    Review of Systems  All other systems reviewed and are negative.       Objective:  Physical Exam  Constitutional: She is oriented to person, place, and time. She appears well-developed and well-nourished.  Musculoskeletal: Normal range of motion.  Neurological: She is alert and oriented to person, place, and time.  Skin: Skin is dry.  Nursing note and vitals reviewed.  neurovascular status was found to be significantly diminished with diminished PT and DP pulses. Neurological was intact with patient noted to have distal two thirds of the nailbeds bilateral being yellow with no indications of proximal yellowness. Patient's found have good mental capacity     Assessment:    Strong possibility for vascular disease along with mycotic nail infection     Plan:     H&P conditions reviewed and I did do a Doppler today and found there to be what appears to be reflected or disease. 4 we will about the anti-fungal that we could consider at this point I am referring for vascular consult with the consideration of angiogram and vascularization if appropriate

## 2017-07-10 ENCOUNTER — Other Ambulatory Visit: Payer: Self-pay | Admitting: Podiatry

## 2017-07-10 DIAGNOSIS — I739 Peripheral vascular disease, unspecified: Secondary | ICD-10-CM

## 2017-07-12 ENCOUNTER — Ambulatory Visit (HOSPITAL_COMMUNITY)
Admission: RE | Admit: 2017-07-12 | Discharge: 2017-07-12 | Disposition: A | Discharge status: Home / Self Care | Payer: BLUE CROSS/BLUE SHIELD | Source: Ambulatory Visit | Attending: Internal Medicine | Admitting: Internal Medicine

## 2017-07-12 DIAGNOSIS — Z87891 Personal history of nicotine dependence: Secondary | ICD-10-CM | POA: Diagnosis not present

## 2017-07-12 DIAGNOSIS — E785 Hyperlipidemia, unspecified: Secondary | ICD-10-CM | POA: Diagnosis not present

## 2017-07-12 DIAGNOSIS — I739 Peripheral vascular disease, unspecified: Secondary | ICD-10-CM | POA: Diagnosis not present

## 2017-07-12 DIAGNOSIS — E1151 Type 2 diabetes mellitus with diabetic peripheral angiopathy without gangrene: Secondary | ICD-10-CM | POA: Insufficient documentation

## 2017-07-17 ENCOUNTER — Telehealth: Payer: Self-pay

## 2017-07-17 NOTE — Telephone Encounter (Signed)
Pt called and states that she had a doppler done last week which shows a blockage in her leg.  She has been experiencing shooting pain and numbness in her groin, legs and feet for a while now, but just assumed it was due to the cancer she had (anal) but since her doppler, she is thinking it is nerve related.  She is wanting to schedule with Dr. Delice Lesch.  Will schedule, but unsure exactly were to put her - she has not been seen in 2 years...but is this urgent enough to work her in?

## 2017-07-17 NOTE — Telephone Encounter (Signed)
Spoke with pt relaying message below.  She is not able to come on 9/13, but I managed to schedule her for 9/11

## 2017-07-17 NOTE — Telephone Encounter (Signed)
Pls let her know that some of it may still relate to poor circulation and continue with follow-up Vascular has scheduled for her on 9/4, but I can seen her on my work-in slots for 9/13. Thanks

## 2017-07-18 ENCOUNTER — Ambulatory Visit (INDEPENDENT_AMBULATORY_CARE_PROVIDER_SITE_OTHER): Payer: BLUE CROSS/BLUE SHIELD | Admitting: Cardiovascular Disease

## 2017-07-18 ENCOUNTER — Telehealth: Payer: Self-pay | Admitting: Gastroenterology

## 2017-07-18 VITALS — BP 110/72 | HR 85 | Ht 63.0 in | Wt 102.0 lb

## 2017-07-18 DIAGNOSIS — I739 Peripheral vascular disease, unspecified: Secondary | ICD-10-CM

## 2017-07-18 DIAGNOSIS — E7801 Familial hypercholesterolemia: Secondary | ICD-10-CM

## 2017-07-18 MED ORDER — LINZESS 145 MCG PO CAPS: ORAL_CAPSULE | ORAL | 3 refills | Status: DC

## 2017-07-18 MED ORDER — ROSUVASTATIN CALCIUM 20 MG PO TABS: 20.0000 mg | ORAL_TABLET | Freq: Every day | ORAL | 2 refills | Status: DC

## 2017-07-18 NOTE — Patient Instructions (Addendum)
   [facility] [address] [place] [address] Dept: [phone] Loc: [place]  07/18/2017  You are scheduled for a Peripheral Angiogram on Wednesday, September 12 with Dr. Kathlyn Sacramento.  1. Please arrive at the [facility] (Main Entrance A) at [facility]: [address] [place], [place] [address] at 6:30 AM (two hours before your procedure to ensure your preparation). Free valet parking service is available.   Special note: Every effort is made to have your procedure done on time. Please understand that emergencies sometimes delay scheduled procedures.  2. Diet: Do not eat or drink anything after midnight prior to your procedure except sips of water to take medications.  3. Labs: You will need to have blood drawn the week of Monday, September 3 in our office. You do not need to be fasting.  4. Medication instructions in preparation for your procedure:  On the morning of your procedure, take your Aspirin and any morning medicines NOT listed above.  You may use sips of water.  5. Plan for one night stay--bring personal belongings. 6. Bring a current list of your medications and current insurance cards. 7. You MUST have a responsible person to drive you home. 8. Someone MUST be with you the first 24 hours after you arrive home or your discharge will be delayed. 9. Please wear clothes that are easy to get on and off and wear slip-on shoes.  Thank you for allowing [place] to care for you!   -- [facility]   MEDICATION CHANGES MADE TODAY:   START Aspirin 81 mg daily. START Rosuvastatin 20 mg daily.  Your physician recommends that you schedule a follow-up appointment in: 1 month with Dr. Fletcher Anon.

## 2017-07-18 NOTE — Progress Notes (Signed)
Cardiology Office Note   Date:  07/18/2017   ID:  Erin, [name] Jun 29, 1956, MRN [id number]  PCP:  Lucretia Kern, DO  Cardiologist:   Kathlyn Sacramento, MD   Chief Complaint  Patient presents with  . New Patient (Initial Visit)      History of Present Illness: Erin [name] is a 61 y.o. female who was referred by Dr. Paulla Dolly for evaluation of peripheral arterial disease. She has known history of severe hyperlipidemia likely familial hyperlipidemia with LDL greater than 200, chronic back pain with previous right hip fracture, rectal cancer status post radiation and chemotherapy about 4 years ago and previous tobacco use. She quit smoking about 4 years ago. She also has diet-controlled diabetes. Family history is remarkable for severe hyperlipidemia and premature coronary artery disease. The patient reports severe bilateral leg pain affecting both thighs and calves which started about 4 years ago and has been worsening. She takes lots of ibuprofen every day for the pain. The pain happens after minimal walking distance. She also has numbness at rest. She underwent noninvasive vascular evaluation which showed moderately reduced ABI worse on the left side. Duplex showed iliac disease. The right SFA was severely and diffusely diseased. The left SFA was occluded at its origin with reconstitution distally. Three-vessel runoff bilaterally below the knee. She has known history of hyperlipidemia for many years with no previous cardiac history. She is intolerant to statins.   Past Medical History:  Diagnosis Date  . Allergy   . Alopecia   . 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 Anxiety   . B12 deficiency anemia 09/14/2015  . Blood transfusion without reported diagnosis   . Chronic back pain   . Chronic daily headache 03/29/2013   takes bc powder  . Closed right hip fracture ([place]) 09/10/2015  . Depression   . DM  (diabetes mellitus) ([place])    diet controlled  . GERD (gastroesophageal reflux disease)   . HLD (hyperlipidemia)   . Hot flashes   . Hyperlipemia 04/11/2014  . Hypokalemia 05/2016  . Hypothyroidism 09/10/2015  . IBS (irritable bowel syndrome) 03/29/2013  . 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 Tubular adenoma of colon 09/08/2003  . Vertigo     Past Surgical History:  Procedure Laterality Date  . COLONOSCOPY    . ECTOPIC PREGNANCY SURGERY    . FLEXIBLE SIGMOIDOSCOPY N/A 08/14/2013   Procedure: FLEXIBLE SIGMOIDOSCOPY;  Surgeon: Ladene Artist, MD;  Location: [facility];  Service: Endoscopy;  Laterality: N/A;  . PILONIDAL CYST EXCISION    . POLYPECTOMY    . TONSILLECTOMY    . TOTAL HIP ARTHROPLASTY  09/11/2015   Procedure: TOTAL HIP ARTHROPLASTY;  Surgeon: Renette Butters, MD;  Location: [facility];  Service: Orthopedics;;     Current Outpatient Prescriptions  Medication Sig Dispense Refill  . acyclovir ointment (ZOVIRAX) 5 % Apply 1 application topically every 3 (three) hours. 15 g 1  . dicyclomine (BENTYL) 10 MG capsule TAKE ONE CAPSULE BY MOUTH THREE TIMES DAILY BEFORE MEALS. DUE FOR FOLLOW UP IN MAY, NEEDS TO SCHEDULE AN APPOINTMENT 270 capsule 1  . ezetimibe (ZETIA) 10 MG tablet Take 1 tablet (10 mg total) by mouth daily. 90 tablet 3  . fluticasone (FLONASE) 50 MCG/ACT nasal spray USE 1 SPRAY IN EACH NOSTRIL DAILY 16 g 5  . gabapentin (NEURONTIN) 600 MG tablet TAKE 1 TABLET BY MOUTH THREE TIMES  DAILY GENERIC EQUIVALENT FOR NEURONTIN 270 tablet 1  . levothyroxine (SYNTHROID, LEVOTHROID) 75 MCG tablet TAKE 1 TABLET BY MOUTH DAILY GENERIC EQUIVALENT FOR SYNTHROID 90 tablet 1  . LINZESS 145 MCG CAPS capsule TAKE ONE CAPSULE BY MOUTH DAILY BEFORE BREAKFAST 90 capsule 3  . meclizine (ANTIVERT) 12.5 MG tablet Take 1 tablet (12.5 mg total) by mouth 3 (three) times daily as needed for dizziness. 30 tablet 0  . nystatin-triamcinolone (MYCOLOG II) cream APPLY TO CORNERS OF THE MOUTH  TWICE A DAY  2  . omeprazole (PRILOSEC) 40 MG capsule TAKE ONE CAPSULE BY MOUTH DAILY. NEED APPOINTMENT FOR FURTHER REFILLS 90 capsule 1  . ondansetron (ZOFRAN) 4 MG tablet Take 1 tablet (4 mg total) by mouth every 8 (eight) hours as needed for nausea or vomiting. 30 tablet 0  . potassium chloride SA (K-DUR,KLOR-CON) 20 MEQ tablet TAKE 1 TABLET BY MOUTH DAILY 90 tablet 1  . vitamin B-12 1000 MCG tablet Take 1 tablet (1,000 mcg total) by mouth daily. 30 tablet 0   No current facility-administered medications for this visit.     Allergies:   Amitiza [lubiprostone]; Claritin [loratadine]; Dexamethasone; Sulfa antibiotics; Atorvastatin; Nortriptyline; and Prednisone    Social History:  The patient  reports that she quit smoking about 2 years ago. Her smoking use included Cigarettes. She smoked 0.50 packs per day. She has never used smokeless tobacco. She reports that she does not drink alcohol or use drugs.   Family History:  The patient's family history includes Arthritis in her mother; Heart attack in her father, maternal grandfather, maternal uncle, and mother; Heart disease in her father and mother; Hyperlipidemia in her father and mother; Hypertension in her father and mother; Irritable bowel syndrome in her mother; Lung cancer in her brother; Prostate cancer in her father; Stroke (age of onset: 18) in her mother; Stroke (age of onset: 28) in her father; Thyroid disease in her father and mother.    ROS:  Please see the history of present illness.   Otherwise, review of systems are positive for none.   All other systems are reviewed and negative.    PHYSICAL EXAM: VS:  BP 110/72   Pulse 85   Ht 5\' 3"  (1.6 m)   Wt 102 lb (46.3 kg)   BMI 18.07 kg/m  , BMI Body mass index is 18.07 kg/m. GEN: Well nourished, well developed, in no acute distress  HEENT: normal  Neck: no JVD, carotid bruits, or masses Cardiac: RRR; no murmurs, rubs, or gallops,no edema  Respiratory:  clear to auscultation  bilaterally, normal work of breathing GI: soft, nontender, nondistended, + BS MS: no deformity or atrophy  Skin: warm and dry, no rash Neuro:  Strength and sensation are intact Psych: euthymic mood, full affect Vascular: Femoral pulses +1 bilaterally. Distal pulses are not palpable.  EKG:  EKG is ordered today. The ekg ordered today demonstrates normal sinus rhythm with no significant ST or T wave changes.   Recent Labs: 05/11/2017: BUN 18; Creatinine, Ser 0.75; Potassium 4.2; Sodium 135; TSH 2.34    Lipid Panel    Component Value Date/Time   CHOL 307 (H) 05/11/2017 1018   TRIG 366.0 (H) 09/26/2016 1150   HDL 44.70 05/11/2017 1018   CHOLHDL 9 09/26/2016 1150   VLDL 73.2 (H) 09/26/2016 1150   LDLCALC 217 (H) 03/09/2015 1039   LDLDIRECT 214.0 09/26/2016 1150      Wt Readings from Last 3 Encounters:  07/18/17 102 lb (46.3 kg)  05/29/17  103 lb 3.2 oz (46.8 kg)  05/11/17 105 lb (47.6 kg)        No flowsheet data found.    ASSESSMENT AND PLAN:  1.  Peripheral arterial disease: Severe bilateral thigh and calf claudication which is lifestyle limiting. I asked her to start taking aspirin 81 mg once daily. I discussed with her natural history and management of claudication. Given the severity of her symptoms, I recommend proceeding with abdominal aortogram, lower extremity runoff and possible endovascular intervention. I discussed the procedure in details as well as risk and benefits. There is possibility of inflow disease as her femoral pulses are diminished bilaterally. SFA disease might need to be managed conservatively or surgically.  2. Hyperlipidemia: I suspect that she likely has familial hyperlipidemia. She has history of intolerance to statins although it appears that there are a lot of her symptoms might be related to claudication. I'm going to try rosuvastatin 20 mg daily. If she cannot tolerate this, I recommend treatment with a PCSK9 inhibitor.    Disposition:   FU  with me in 1 month  Signed,  Kathlyn Sacramento, MD  07/18/2017 10:08 AM    [place]

## 2017-07-18 NOTE — Telephone Encounter (Signed)
Prescription sent to [facility] mail order pharmacy. Patient notified that Linzess 145 mcg was sent to the pharmacy and we will leave samples out at the front desk for patient to pick up.

## 2017-07-21 ENCOUNTER — Other Ambulatory Visit: Payer: Self-pay | Admitting: Family Medicine

## 2017-07-25 ENCOUNTER — Institutional Professional Consult (permissible substitution): Payer: BLUE CROSS/BLUE SHIELD | Admitting: Cardiovascular Disease

## 2017-07-25 ENCOUNTER — Telehealth: Payer: Self-pay | Admitting: Neurology

## 2017-07-25 NOTE — Telephone Encounter (Signed)
Patient cancelled due to having a peripheral angiogram on 08/02/17. The main artery in her legs are 100% and 90 % blocked. So she does not need the appt with Dr. Delice Lesch. She had a doppler which showed her what was going on. She wanted Dr. Delice Lesch to know what was going on. Thanks

## 2017-07-26 ENCOUNTER — Telehealth: Payer: Self-pay | Admitting: *Deleted

## 2017-07-26 LAB — CBC WITH DIFFERENTIAL/PLATELET
BASOS ABS: 0 10*3/uL (ref 0.0–0.2)
BASOS: 0 %
EOS (ABSOLUTE): 0.1 10*3/uL (ref 0.0–0.4)
Eos: 2 %
HEMOGLOBIN: 11.4 g/dL (ref 11.1–15.9)
Hematocrit: 35 % (ref 34.0–46.6)
Immature Grans (Abs): 0 10*3/uL (ref 0.0–0.1)
Immature Granulocytes: 0 %
LYMPHS ABS: 1.4 10*3/uL (ref 0.7–3.1)
Lymphs: 18 %
MCH: 30.6 pg (ref 26.6–33.0)
MCHC: 32.6 g/dL (ref 31.5–35.7)
MCV: 94 fL (ref 79–97)
MONOCYTES: 1 %
Monocytes Absolute: 0.1 10*3/uL (ref 0.1–0.9)
NEUTROS ABS: 6.1 10*3/uL (ref 1.4–7.0)
Neutrophils: 79 %
Platelets: 365 10*3/uL (ref 150–379)
RBC: 3.72 x10E6/uL — ABNORMAL LOW (ref 3.77–5.28)
RDW: 13.8 % (ref 12.3–15.4)
WBC: 7.7 10*3/uL (ref 3.4–10.8)

## 2017-07-26 LAB — BASIC METABOLIC PANEL
BUN / CREAT RATIO: 11 — AB (ref 12–28)
BUN: 8 mg/dL (ref 8–27)
CHLORIDE: 100 mmol/L (ref 96–106)
CO2: 23 mmol/L (ref 20–29)
Calcium: 9.4 mg/dL (ref 8.7–10.3)
Creatinine, Ser: 0.76 mg/dL (ref 0.57–1.00)
GFR calc non Af Amer: 86 mL/min/{1.73_m2} (ref >59)
GFR, EST AFRICAN AMERICAN: 99 mL/min/{1.73_m2} (ref >59)
Glucose: 79 mg/dL (ref 65–99)
POTASSIUM: 5.4 mmol/L — AB (ref 3.5–5.2)
Sodium: 139 mmol/L (ref 134–144)

## 2017-07-26 LAB — PROTIME-INR
INR: 1 (ref 0.8–1.2)
Prothrombin Time: 10.1 s (ref 9.1–12.0)

## 2017-07-26 LAB — APTT: aPTT: 28 s (ref 24–33)

## 2017-07-26 NOTE — Telephone Encounter (Addendum)
-----   Message from Wellington Hampshire, MD sent at 07/26/2017  3:14 PM EDT ----- Pre-angiogram labs showed elevated potassium at 5.4. Ask her to stop taking potassium chloride.   Spoke with pt, aware to stop potassium.

## 2017-08-01 ENCOUNTER — Ambulatory Visit: Payer: BLUE CROSS/BLUE SHIELD | Admitting: Neurology

## 2017-08-02 ENCOUNTER — Ambulatory Visit (HOSPITAL_COMMUNITY)
Admission: RE | Admit: 2017-08-02 | Discharge: 2017-08-02 | Disposition: A | Discharge status: Home / Self Care | Payer: BLUE CROSS/BLUE SHIELD | Source: Ambulatory Visit | Attending: Cardiovascular Disease | Admitting: Cardiovascular Disease

## 2017-08-02 ENCOUNTER — Encounter (HOSPITAL_COMMUNITY)
Admission: RE | Disposition: A | Discharge status: Home / Self Care | Payer: Self-pay | Source: Ambulatory Visit | Attending: Cardiovascular Disease

## 2017-08-02 DIAGNOSIS — F329 Major depressive disorder, single episode, unspecified: Secondary | ICD-10-CM | POA: Diagnosis not present

## 2017-08-02 DIAGNOSIS — M549 Dorsalgia, unspecified: Secondary | ICD-10-CM | POA: Diagnosis not present

## 2017-08-02 DIAGNOSIS — I70221 Atherosclerosis of native arteries of extremities with rest pain, right leg: Secondary | ICD-10-CM | POA: Diagnosis not present

## 2017-08-02 DIAGNOSIS — D519 Vitamin B12 deficiency anemia, unspecified: Secondary | ICD-10-CM | POA: Diagnosis not present

## 2017-08-02 DIAGNOSIS — K589 Irritable bowel syndrome without diarrhea: Secondary | ICD-10-CM | POA: Insufficient documentation

## 2017-08-02 DIAGNOSIS — Z87891 Personal history of nicotine dependence: Secondary | ICD-10-CM | POA: Insufficient documentation

## 2017-08-02 DIAGNOSIS — K219 Gastro-esophageal reflux disease without esophagitis: Secondary | ICD-10-CM | POA: Insufficient documentation

## 2017-08-02 DIAGNOSIS — I739 Peripheral vascular disease, unspecified: Secondary | ICD-10-CM | POA: Diagnosis present

## 2017-08-02 DIAGNOSIS — Z923 Personal history of irradiation: Secondary | ICD-10-CM | POA: Insufficient documentation

## 2017-08-02 DIAGNOSIS — Z7951 Long term (current) use of inhaled steroids: Secondary | ICD-10-CM | POA: Diagnosis not present

## 2017-08-02 DIAGNOSIS — G8929 Other chronic pain: Secondary | ICD-10-CM | POA: Diagnosis not present

## 2017-08-02 DIAGNOSIS — I714 Abdominal aortic aneurysm, without rupture: Secondary | ICD-10-CM | POA: Insufficient documentation

## 2017-08-02 DIAGNOSIS — Z882 Allergy status to sulfonamides status: Secondary | ICD-10-CM | POA: Insufficient documentation

## 2017-08-02 DIAGNOSIS — E785 Hyperlipidemia, unspecified: Secondary | ICD-10-CM | POA: Diagnosis not present

## 2017-08-02 DIAGNOSIS — I70223 Atherosclerosis of native arteries of extremities with rest pain, bilateral legs: Secondary | ICD-10-CM | POA: Insufficient documentation

## 2017-08-02 DIAGNOSIS — I701 Atherosclerosis of renal artery: Secondary | ICD-10-CM | POA: Diagnosis not present

## 2017-08-02 DIAGNOSIS — Z85048 Personal history of other malignant neoplasm of rectum, rectosigmoid junction, and anus: Secondary | ICD-10-CM | POA: Insufficient documentation

## 2017-08-02 DIAGNOSIS — F419 Anxiety disorder, unspecified: Secondary | ICD-10-CM | POA: Insufficient documentation

## 2017-08-02 DIAGNOSIS — E1151 Type 2 diabetes mellitus with diabetic peripheral angiopathy without gangrene: Secondary | ICD-10-CM | POA: Diagnosis not present

## 2017-08-02 DIAGNOSIS — Z8249 Family history of ischemic heart disease and other diseases of the circulatory system: Secondary | ICD-10-CM | POA: Insufficient documentation

## 2017-08-02 DIAGNOSIS — Z9221 Personal history of antineoplastic chemotherapy: Secondary | ICD-10-CM | POA: Insufficient documentation

## 2017-08-02 DIAGNOSIS — E039 Hypothyroidism, unspecified: Secondary | ICD-10-CM | POA: Insufficient documentation

## 2017-08-02 HISTORY — PX: LOWER EXTREMITY ANGIOGRAPHY: CATH118251

## 2017-08-02 HISTORY — PX: ABDOMINAL AORTOGRAM: CATH118222

## 2017-08-02 LAB — GLUCOSE, CAPILLARY
Glucose-Capillary: 83 mg/dL (ref 65–99)
Glucose-Capillary: 91 mg/dL (ref 65–99)

## 2017-08-02 SURGERY — LOWER EXTREMITY ANGIOGRAPHY
Anesthesia: LOCAL

## 2017-08-02 MED ORDER — SODIUM CHLORIDE 0.9% FLUSH
3.0000 mL | Freq: Two times a day (BID) | INTRAVENOUS | Status: DC
Start: 2017-08-02 — End: 2017-08-02

## 2017-08-02 MED ORDER — MIDAZOLAM HCL 2 MG/2ML IJ SOLN
INTRAMUSCULAR | Status: DC | PRN
  Administered 2017-08-02: 1 mg via INTRAVENOUS

## 2017-08-02 MED ORDER — SODIUM CHLORIDE 0.9 % IV SOLN: 250.0000 mL | INTRAVENOUS | Status: DC | PRN

## 2017-08-02 MED ORDER — HEPARIN (PORCINE) IN NACL 2-0.9 UNIT/ML-% IJ SOLN
INTRAMUSCULAR | Status: AC
  Filled 2017-08-02: qty 1000

## 2017-08-02 MED ORDER — MIDAZOLAM HCL 2 MG/2ML IJ SOLN
INTRAMUSCULAR | Status: AC
  Filled 2017-08-02: qty 2

## 2017-08-02 MED ORDER — HEPARIN (PORCINE) IN NACL 2-0.9 UNIT/ML-% IJ SOLN
INTRAMUSCULAR | Status: AC | PRN
  Administered 2017-08-02: 1000 mL

## 2017-08-02 MED ORDER — FENTANYL CITRATE (PF) 100 MCG/2ML IJ SOLN
INTRAMUSCULAR | Status: DC | PRN
  Administered 2017-08-02: 25 ug via INTRAVENOUS

## 2017-08-02 MED ORDER — LIDOCAINE HCL (PF) 1 % IJ SOLN
INTRAMUSCULAR | Status: AC
  Filled 2017-08-02: qty 30

## 2017-08-02 MED ORDER — SODIUM CHLORIDE 0.9 % IV SOLN: INTRAVENOUS | Status: AC

## 2017-08-02 MED ORDER — SODIUM CHLORIDE 0.9 % IV SOLN
250.0000 mL | INTRAVENOUS | Status: DC | PRN
Start: 2017-08-02 — End: 2017-08-02

## 2017-08-02 MED ORDER — SODIUM CHLORIDE 0.9% FLUSH: 3.0000 mL | INTRAVENOUS | Status: DC | PRN

## 2017-08-02 MED ORDER — IODIXANOL 320 MG/ML IV SOLN
INTRAVENOUS | Status: DC | PRN
  Administered 2017-08-02: 105 mL via INTRAVENOUS

## 2017-08-02 MED ORDER — LIDOCAINE HCL (PF) 1 % IJ SOLN
INTRAMUSCULAR | Status: DC | PRN
  Administered 2017-08-02: 7 mL via INTRADERMAL

## 2017-08-02 MED ORDER — SODIUM CHLORIDE 0.9 % IV SOLN
INTRAVENOUS | Status: DC
Start: 2017-08-02 — End: 2017-08-02
  Administered 2017-08-02: 07:00:00 via INTRAVENOUS

## 2017-08-02 MED ORDER — FENTANYL CITRATE (PF) 100 MCG/2ML IJ SOLN
INTRAMUSCULAR | Status: AC
  Filled 2017-08-02: qty 2

## 2017-08-02 MED ORDER — SODIUM CHLORIDE 0.9% FLUSH: 3.0000 mL | Freq: Two times a day (BID) | INTRAVENOUS | Status: DC

## 2017-08-02 MED ORDER — ASPIRIN 81 MG PO CHEW: 81.0000 mg | CHEWABLE_TABLET | ORAL | Status: DC

## 2017-08-02 SURGICAL SUPPLY — 12 items
CATH ANGIO 5F PIGTAIL 65CM (CATHETERS) ×1 IMPLANT
COVER PRB 48X5XTLSCP FOLD TPE (BAG) IMPLANT
COVER PROBE 5X48 (BAG) ×3
KIT MICROINTRODUCER STIFF 5F (SHEATH) ×1 IMPLANT
KIT PV (KITS) ×3 IMPLANT
SHEATH PINNACLE 5F 10CM (SHEATH) ×1 IMPLANT
STOPCOCK MORSE 400PSI 3WAY (MISCELLANEOUS) ×1 IMPLANT
SYRINGE MEDRAD AVANTA MACH 7 (SYRINGE) ×1 IMPLANT
TRANSDUCER W/STOPCOCK (MISCELLANEOUS) ×3 IMPLANT
TRAY PV CATH (CUSTOM PROCEDURE TRAY) ×3 IMPLANT
TUBING CIL FLEX 10 FLL-RA (TUBING) ×1 IMPLANT
WIRE HITORQ VERSACORE ST 145CM (WIRE) ×1 IMPLANT

## 2017-08-02 NOTE — H&P (View-Only) (Signed)
Cardiology Office Note   Date:  07/18/2017   ID:  Erin, [name] November 28, 1955, MRN [id number]  PCP:  Erin Kern, DO  Cardiologist:   Erin Sacramento, MD   Chief Complaint  Patient presents with  . New Patient (Initial Visit)      History of Present Illness: Erin [name] is a 61 y.o. female who was referred by Dr. Paulla Dolly for evaluation of peripheral arterial disease. She has known history of severe hyperlipidemia likely familial hyperlipidemia with LDL greater than 200, chronic back pain with previous right hip fracture, rectal cancer status post radiation and chemotherapy about 4 years ago and previous tobacco use. She quit smoking about 4 years ago. She also has diet-controlled diabetes. Family history is remarkable for severe hyperlipidemia and premature coronary artery disease. The patient reports severe bilateral leg pain affecting both thighs and calves which started about 4 years ago and has been worsening. She takes lots of ibuprofen every day for the pain. The pain happens after minimal walking distance. She also has numbness at rest. She underwent noninvasive vascular evaluation which showed moderately reduced ABI worse on the left side. Duplex showed iliac disease. The right SFA was severely and diffusely diseased. The left SFA was occluded at its origin with reconstitution distally. Three-vessel runoff bilaterally below the knee. She has known history of hyperlipidemia for many years with no previous cardiac history. She is intolerant to statins.   Past Medical History:  Diagnosis Date  . Allergy   . Alopecia   . 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 Anxiety   . B12 deficiency anemia 09/14/2015  . Blood transfusion without reported diagnosis   . Chronic back pain   . Chronic daily headache 03/29/2013   takes bc powder  . Closed right hip fracture ([place]) 09/10/2015  . Depression   . DM  (diabetes mellitus) ([place])    diet controlled  . GERD (gastroesophageal reflux disease)   . HLD (hyperlipidemia)   . Hot flashes   . Hyperlipemia 04/11/2014  . Hypokalemia 05/2016  . Hypothyroidism 09/10/2015  . IBS (irritable bowel syndrome) 03/29/2013  . 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 Tubular adenoma of colon 09/08/2003  . Vertigo     Past Surgical History:  Procedure Laterality Date  . COLONOSCOPY    . ECTOPIC PREGNANCY SURGERY    . FLEXIBLE SIGMOIDOSCOPY N/A 08/14/2013   Procedure: FLEXIBLE SIGMOIDOSCOPY;  Surgeon: Ladene Artist, MD;  Location: [facility];  Service: Endoscopy;  Laterality: N/A;  . PILONIDAL CYST EXCISION    . POLYPECTOMY    . TONSILLECTOMY    . TOTAL HIP ARTHROPLASTY  09/11/2015   Procedure: TOTAL HIP ARTHROPLASTY;  Surgeon: Renette Butters, MD;  Location: [facility];  Service: Orthopedics;;     Current Outpatient Prescriptions  Medication Sig Dispense Refill  . acyclovir ointment (ZOVIRAX) 5 % Apply 1 application topically every 3 (three) hours. 15 g 1  . dicyclomine (BENTYL) 10 MG capsule TAKE ONE CAPSULE BY MOUTH THREE TIMES DAILY BEFORE MEALS. DUE FOR FOLLOW UP IN MAY, NEEDS TO SCHEDULE AN APPOINTMENT 270 capsule 1  . ezetimibe (ZETIA) 10 MG tablet Take 1 tablet (10 mg total) by mouth daily. 90 tablet 3  . fluticasone (FLONASE) 50 MCG/ACT nasal spray USE 1 SPRAY IN EACH NOSTRIL DAILY 16 g 5  . gabapentin (NEURONTIN) 600 MG tablet TAKE 1 TABLET BY MOUTH THREE TIMES  DAILY GENERIC EQUIVALENT FOR NEURONTIN 270 tablet 1  . levothyroxine (SYNTHROID, LEVOTHROID) 75 MCG tablet TAKE 1 TABLET BY MOUTH DAILY GENERIC EQUIVALENT FOR SYNTHROID 90 tablet 1  . LINZESS 145 MCG CAPS capsule TAKE ONE CAPSULE BY MOUTH DAILY BEFORE BREAKFAST 90 capsule 3  . meclizine (ANTIVERT) 12.5 MG tablet Take 1 tablet (12.5 mg total) by mouth 3 (three) times daily as needed for dizziness. 30 tablet 0  . nystatin-triamcinolone (MYCOLOG II) cream APPLY TO CORNERS OF THE MOUTH  TWICE A DAY  2  . omeprazole (PRILOSEC) 40 MG capsule TAKE ONE CAPSULE BY MOUTH DAILY. NEED APPOINTMENT FOR FURTHER REFILLS 90 capsule 1  . ondansetron (ZOFRAN) 4 MG tablet Take 1 tablet (4 mg total) by mouth every 8 (eight) hours as needed for nausea or vomiting. 30 tablet 0  . potassium chloride SA (K-DUR,KLOR-CON) 20 MEQ tablet TAKE 1 TABLET BY MOUTH DAILY 90 tablet 1  . vitamin B-12 1000 MCG tablet Take 1 tablet (1,000 mcg total) by mouth daily. 30 tablet 0   No current facility-administered medications for this visit.     Allergies:   Amitiza [lubiprostone]; Claritin [loratadine]; Dexamethasone; Sulfa antibiotics; Atorvastatin; Nortriptyline; and Prednisone    Social History:  The patient  reports that she quit smoking about 2 years ago. Her smoking use included Cigarettes. She smoked 0.50 packs per day. She has never used smokeless tobacco. She reports that she does not drink alcohol or use drugs.   Family History:  The patient's family history includes Arthritis in her mother; Heart attack in her father, maternal grandfather, maternal uncle, and mother; Heart disease in her father and mother; Hyperlipidemia in her father and mother; Hypertension in her father and mother; Irritable bowel syndrome in her mother; Lung cancer in her brother; Prostate cancer in her father; Stroke (age of onset: 17) in her mother; Stroke (age of onset: 50) in her father; Thyroid disease in her father and mother.    ROS:  Please see the history of present illness.   Otherwise, review of systems are positive for none.   All other systems are reviewed and negative.    PHYSICAL EXAM: VS:  BP 110/72   Pulse 85   Ht 5\' 3"  (1.6 m)   Wt 102 lb (46.3 kg)   BMI 18.07 kg/m  , BMI Body mass index is 18.07 kg/m. GEN: Well nourished, well developed, in no acute distress  HEENT: normal  Neck: no JVD, carotid bruits, or masses Cardiac: RRR; no murmurs, rubs, or gallops,no edema  Respiratory:  clear to auscultation  bilaterally, normal work of breathing GI: soft, nontender, nondistended, + BS MS: no deformity or atrophy  Skin: warm and dry, no rash Neuro:  Strength and sensation are intact Psych: euthymic mood, full affect Vascular: Femoral pulses +1 bilaterally. Distal pulses are not palpable.  EKG:  EKG is ordered today. The ekg ordered today demonstrates normal sinus rhythm with no significant ST or T wave changes.   Recent Labs: 05/11/2017: BUN 18; Creatinine, Ser 0.75; Potassium 4.2; Sodium 135; TSH 2.34    Lipid Panel    Component Value Date/Time   CHOL 307 (H) 05/11/2017 1018   TRIG 366.0 (H) 09/26/2016 1150   HDL 44.70 05/11/2017 1018   CHOLHDL 9 09/26/2016 1150   VLDL 73.2 (H) 09/26/2016 1150   LDLCALC 217 (H) 03/09/2015 1039   LDLDIRECT 214.0 09/26/2016 1150      Wt Readings from Last 3 Encounters:  07/18/17 102 lb (46.3 kg)  05/29/17  103 lb 3.2 oz (46.8 kg)  05/11/17 105 lb (47.6 kg)        No flowsheet data found.    ASSESSMENT AND PLAN:  1.  Peripheral arterial disease: Severe bilateral thigh and calf claudication which is lifestyle limiting. I asked her to start taking aspirin 81 mg once daily. I discussed with her natural history and management of claudication. Given the severity of her symptoms, I recommend proceeding with abdominal aortogram, lower extremity runoff and possible endovascular intervention. I discussed the procedure in details as well as risk and benefits. There is possibility of inflow disease as her femoral pulses are diminished bilaterally. SFA disease might need to be managed conservatively or surgically.  2. Hyperlipidemia: I suspect that she likely has familial hyperlipidemia. She has history of intolerance to statins although it appears that there are a lot of her symptoms might be related to claudication. I'm going to try rosuvastatin 20 mg daily. If she cannot tolerate this, I recommend treatment with a PCSK9 inhibitor.    Disposition:   FU  with me in 1 month  Signed,  Erin Sacramento, MD  07/18/2017 10:08 AM    [place]

## 2017-08-02 NOTE — Interval H&P Note (Signed)
History and Physical Interval Note:  08/02/2017 8:26 AM  Erin [name]  has presented today for surgery, with the diagnosis of pad  The various methods of treatment have been discussed with the patient and family. After consideration of risks, benefits and other options for treatment, the patient has consented to  Procedure(s): Lower Extremity Angiography (N/A) as a surgical intervention .  The patient's history has been reviewed, patient examined, no change in status, stable for surgery.  I have reviewed the patient's chart and labs.  Questions were answered to the patient's satisfaction.     Kathlyn Sacramento

## 2017-08-02 NOTE — Consult Note (Signed)
Hospital Consult    Reason for Consult:  Rest pain BLE  Requesting Physician:  Fletcher Anon MRN #:  [id number]  History of Present Illness: This is a 61 y.o. female who underwent aortogram with BLE runoff this morning by Dr. Fletcher Anon.  She has a hx of rectal cancer 4 years ago with 28 sessions of radiation therapy.  She states that this caused blisters and burned her.  She did not have any abdominal surgery.  She states that she did used to smoke, but quit when she was diagnosed with rectal cancer.    She states that her legs started bothering her around the time she was diagnosed with cancer.  Over the recent months, she has started having pain at rest in both legs but more so the right.  She also get pain in both legs with walking again right greater than left.   She underwent ABI's on 07/17/17 and revealed 0.67 on the right and 0.55 on the left.   She is on synthroid for hypothyroidism.  She takes Neurontin daily.  She is on Zetia for cholesterol management.  She is allergic to Atorvastatin, but on Crestor.  She takes a daily aspirin.   She has diet controlled diabetes.  She has hx of IBS.   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    Past Medical History:  Diagnosis Date  . Allergy   . Alopecia   . 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 Anxiety   . B12 deficiency anemia 09/14/2015  . Blood transfusion without reported diagnosis   . Chronic back pain   . Chronic daily headache 03/29/2013   takes bc powder  . Closed right hip fracture ([place]) 09/10/2015  . Depression   . DM (diabetes mellitus) ([place])    diet controlled  . GERD (gastroesophageal reflux disease)   . HLD (hyperlipidemia)   . Hot flashes   . Hyperlipemia 04/11/2014  . Hypokalemia 05/2016  . Hypothyroidism 09/10/2015  . IBS (irritable bowel syndrome) 03/29/2013  . 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 Tubular adenoma of colon 09/08/2003  . Vertigo     Past  Surgical History:  Procedure Laterality Date  . COLONOSCOPY    . ECTOPIC PREGNANCY SURGERY    . FLEXIBLE SIGMOIDOSCOPY N/A 08/14/2013   Procedure: FLEXIBLE SIGMOIDOSCOPY;  Surgeon: Ladene Artist, MD;  Location: [facility];  Service: Endoscopy;  Laterality: N/A;  . PILONIDAL CYST EXCISION    . POLYPECTOMY    . TONSILLECTOMY    . TOTAL HIP ARTHROPLASTY  09/11/2015   Procedure: TOTAL HIP ARTHROPLASTY;  Surgeon: Renette Butters, MD;  Location: [facility];  Service: Orthopedics;;    Allergies  Allergen Reactions  . Amitiza [Lubiprostone] Other (See Comments)    Uncontrollable diarrhea  . Claritin [Loratadine] Other (See Comments)    Hot flashes  . Dexamethasone Itching    Itching  . Sulfa Antibiotics Other (See Comments)    GI distress-pain  . Atorvastatin Nausea And Vomiting  . Nortriptyline Other (See Comments)    Stomach distention  . Prednisone Rash    Prior to Admission medications   Medication Sig Start Date End Date Taking? Authorizing Provider  acyclovir ointment (ZOVIRAX) 5 % Apply 1 application topically every 3 (three) hours. Patient taking differently: Apply 1 application topically every 3 (three) hours as needed (for fever blister).  08/18/16  Yes Lucretia Kern, DO  aspirin EC 81 MG  tablet Take 81 mg by mouth daily.   Yes [provider]  Biotin 10000 MCG TABS Take 10,000 mcg by mouth daily.   Yes [provider]  dicyclomine (BENTYL) 10 MG capsule TAKE ONE CAPSULE BY MOUTH THREE TIMES DAILY BEFORE MEALS. DUE FOR FOLLOW UP IN MAY, NEEDS TO SCHEDULE AN APPOINTMENT Patient taking differently: TAKE ONE CAPSULE BY MOUTH THREE TIMES DAILY BEFORE MEALS 07/03/17  Yes Ladene Artist, MD  fluticasone (FLONASE) 50 MCG/ACT nasal spray USE 1 SPRAY IN EACH NOSTRIL DAILY Patient taking differently: USE 1 SPRAY IN [facility] NOSTRIL DAILY AT BEDTMIE 01/17/17  Yes Colin Benton R, DO  gabapentin (NEURONTIN) 600 MG tablet TAKE 1 TABLET BY MOUTH THREE TIMES DAILY GENERIC  EQUIVALENT FOR NEURONTIN 04/14/17  Yes Lucretia Kern, DO  ibuprofen (ADVIL,MOTRIN) 200 MG tablet Take 800 mg by mouth 4 (four) times daily as needed (for pain.).   Yes [provider]  levothyroxine (SYNTHROID, LEVOTHROID) 75 MCG tablet TAKE 1 TABLET BY MOUTH DAILY GENERIC EQUIVALENT FOR SYNTHROID 04/14/17  Yes Lucretia Kern, DO  LINZESS 145 MCG CAPS capsule TAKE ONE CAPSULE BY MOUTH DAILY BEFORE BREAKFAST 07/18/17  Yes Ladene Artist, MD  Multiple Vitamins-Minerals (PRESERVISION AREDS 2 PO) Take 1 capsule by mouth daily.   Yes [provider]  nystatin-triamcinolone (MYCOLOG II) cream APPLY TO CORNERS OF THE MOUTH TWICE A DAY AS NEEDED FOR CRACKING/BURNING 03/30/16  Yes [provider]  omeprazole (PRILOSEC) 40 MG capsule TAKE ONE CAPSULE BY MOUTH DAILY. NEED APPOINTMENT FOR FURTHER REFILLS 07/03/17  Yes Ladene Artist, MD  rosuvastatin (CRESTOR) 20 MG tablet Take 1 tablet (20 mg total) by mouth daily. 07/18/17 10/16/17 Yes Wellington Hampshire, MD  vitamin B-12 1000 MCG tablet Take 1 tablet (1,000 mcg total) by mouth daily. 09/14/15  Yes Dhungel, Nishant, MD  ezetimibe (ZETIA) 10 MG tablet Take 1 tablet (10 mg total) by mouth daily. Patient not taking: Reported on 08/01/2017 06/15/17   Lucretia Kern, DO  meclizine (ANTIVERT) 12.5 MG tablet Take 1 tablet (12.5 mg total) by mouth 3 (three) times daily as needed for dizziness. 12/13/16   Lucretia Kern, DO  ondansetron (ZOFRAN) 4 MG tablet Take 1 tablet (4 mg total) by mouth every 8 (eight) hours as needed for nausea or vomiting. 05/30/16   Ladell Pier, MD    Social History   Social History  . Marital status: Married    Spouse name: N/A  . Number of children: 0  . Years of education: N/A   Occupational History  . caregiver    Social History Main Topics  . Smoking status: Former Smoker    Packs/day: 0.50    Types: Cigarettes    Quit date: 10/07/2014  . Smokeless tobacco: Never Used  . Alcohol use No  . Drug use:  No  . Sexual activity: Yes    Partners: Male   Other Topics Concern  . Not on file   Social History Narrative   Work or School: homemaker      Home Situation: lives with her husband,[name] takes care of her elderly parents       Spiritual Beliefs: Christian      Lifestyle: no regular exercise, poor diet                 Family History  Problem Relation Age of Onset  . Arthritis Mother   . Hyperlipidemia Mother   . Heart disease Mother   . Hypertension  Mother   . Stroke Mother 32  . Irritable bowel syndrome Mother   . Thyroid disease Mother   . Heart attack Mother   . Heart disease Father   . Hyperlipidemia Father   . Hypertension Father   . Stroke Father 61  . Thyroid disease Father   . Prostate cancer Father   . Heart attack Father   . Lung cancer Brother   . Heart attack Maternal Grandfather   . Heart attack Maternal Uncle   . Colon cancer Neg Hx   . Rectal cancer Neg Hx   . Stomach cancer Neg Hx     ROS: [x]  Positive   [ ]  Negative   [ ]  All sytems reviewed and are negative  Cardiac: []  chest pain/pressure []  palpitations []  SOB lying flat []  DOE  Vascular: [x]  pain in legs while walking [x]  pain in legs at rest [x]  pain in legs at night []  non-healing ulcers []  hx of DVT []  swelling in legs  Pulmonary: []  productive cough []  asthma/wheezing []  home O2  Neurologic: []  weakness in []  arms []  legs []  numbness in []  arms []  legs []  hx of CVA []  mini stroke [] difficulty speaking or slurred speech []  temporary loss of vision in one eye []  dizziness  Hematologic: [x]  hx of cancer-Rectal CA-see HPI []  bleeding problems []  problems with blood clotting easily  Endocrine:   [x]  diabetes-diet controlled [x]  thyroid disease  GI []  vomiting blood []  blood in stool  GU: []  CKD/renal failure []  HD--[]  M/W/F or []  T/T/S []  burning with urination []  blood in urine  Psychiatric: []  anxiety []  depression  Musculoskeletal: []   arthritis []  joint pain  Integumentary: []  rashes []  ulcers  Constitutional: []  fever []  chills   Physical Examination  Vitals:   08/02/17 0940 08/02/17 0945  BP: (!) 188/74 (!) 181/65  Pulse: (!) 45 69  Resp: 13 18  Temp:    SpO2: (!) 89% 99%   Body mass index is 18.07 kg/m.  General:  WDWN in NAD Gait: Not observed HENT: WNL, normocephalic Pulmonary: normal non-labored breathing, without Rales, rhonchi,  wheezing Cardiac: regular  Abdomen:  soft, NT/ND, no masses Skin: without rashes; skin over bilateral groin looks ok. Vascular Exam/Pulses:  Right Left  Radial 2+ (normal) 2+ (normal)  Femoral Sheath is in place 2+ (normal)  DP monophasic monophasic  PT monophasic monophasic   Extremities: without ischemic changes, without Gangrene , without cellulitis; without open wounds;  Musculoskeletal: no muscle wasting or atrophy  Neurologic: A&O X 3;  No focal weakness or paresthesias are detected; speech is fluent/normal Psychiatric:  The pt has Normal affect.  CBC    Component Value Date/Time   WBC 7.7 07/25/2017 1155   WBC 5.6 06/27/2016 1041   RBC 3.72 (L) 07/25/2017 1155   RBC 4.15 06/27/2016 1041   HGB 11.4 07/25/2017 1155   HGB 10.6 (L) 11/28/2013 1148   HCT 35.0 07/25/2017 1155   HCT 31.2 (L) 11/28/2013 1148   PLT 365 07/25/2017 1155   MCV 94 07/25/2017 1155   MCV 101.4 (H) 11/28/2013 1148   MCH 30.6 07/25/2017 1155   MCH 29.1 06/08/2016 0323   MCHC 32.6 07/25/2017 1155   MCHC 33.9 06/27/2016 1041   RDW 13.8 07/25/2017 1155   RDW 15.4 (H) 11/28/2013 1148   LYMPHSABS 1.4 07/25/2017 1155   LYMPHSABS 0.8 (L) 11/28/2013 1148   MONOABS 0.4 06/27/2016 1041   MONOABS 0.9 11/28/2013 1148   EOSABS 0.1 07/25/2017 1155  BASOSABS 0.0 07/25/2017 1155   BASOSABS 0.0 11/28/2013 1148    BMET    Component Value Date/Time   NA 139 07/25/2017 1155   NA 139 04/22/2014 1028   K 5.4 (H) 07/25/2017 1155   K 4.1 04/22/2014 1028   CL 100 07/25/2017 1155   CO2  23 07/25/2017 1155   CO2 23 04/22/2014 1028   GLUCOSE 79 07/25/2017 1155   GLUCOSE 95 05/11/2017 1018   GLUCOSE 81 04/22/2014 1028   BUN 8 07/25/2017 1155   BUN 8.8 04/22/2014 1028   CREATININE 0.76 07/25/2017 1155   CREATININE 0.8 04/22/2014 1028   CALCIUM 9.4 07/25/2017 1155   CALCIUM 9.1 04/22/2014 1028   GFRNONAA 86 07/25/2017 1155   GFRAA 99 07/25/2017 1155    COAGS: Lab Results  Component Value Date   INR 1.0 07/25/2017     Non-Invasive Vascular Imaging:   ABI's 07/17/17: Right:  0.67 Left:  0.55  Aortogram with BLE runoff by Dr. Fletcher Anon on 08/02/17:  Statin:  No. Beta Blocker:  No. Aspirin:  Yes.   ACEI:  No. ARB:  No. CCB use:  No Other antiplatelets/anticoagulants:  No.    ASSESSMENT/PLAN: This is a 61 y.o. female with BLE rest pain.   -pt underwent aortogram with BLE runoff, which revealed a small AAA with mild narrowing in the distal aorta.  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  She does have 3 vessel runoff.  She has moderate diffused disease affecting the left common iliac artery, occluded internal iliac artery, occluded proximal SFA with reconstitution distally via collaterals from the profunda with three-vessel runoff below the knee. -Dr. Donnetta Hutching is consulted for aortobifemoral bypass grafting.  Dr. Donnetta Hutching discussed this with the pt and her husband.  She is a a little greater risk for wound healing given her hx of radiation.   -She is wanting to proceed, but she will need cardiac clearance, which will be arranged by Dr. Fletcher Anon and our office will set up her surgery.     [name], PA-C [facility] [phone]  I have examined the patient, reviewed and agree with above. Reviewed arteriogram with Dr.Arida. Patient has severe rest pain in her right leg and less ischemia symptoms in her  left leg. Arteriogram reveals a small infrarenal abdominal aortic aneurysm. Does have irregularity and a small left iliac system with internal iliac occlusions bilaterally. She does have a small intermesenteric artery but it is patent. She has subtotal occlusion throughout her right iliac system and has subtotal occlusion of her common femoral artery on the right. Both superficial femoral arteries were occluded. Her deep femoral artery on the right is patent with collateralization into the above-knee popliteal and she has normal popliteal and three-vessel runoff bilaterally. Long discussion with the patient and her husband present. Agree that there is no durable endovascular option for this patient. Have recommended aortobifemoral bypass for treatment of her right leg limb threatening ischemia and claudication symptoms on the left. Explained that we would not address her superficial femoral artery occlusions and typically restoring normal flow to the groin would alleviate her symptoms. She will obtain a quick cardiac clearance and then we will schedule her for elective surgery for aortobifemoral bypass. I did discuss the magnitude of surgery with an expected one tonight intensive care stay and 5-6 day hospitalization with prolonged recovery time.        Marland Kitchen  Curt Jews, MD 08/02/2017 10:17 AM

## 2017-08-02 NOTE — Progress Notes (Signed)
Site area: Right groin a 5 french arterial sheath was removed  Site Prior to Removal:  Level 0  Pressure Applied For 20 MINUTES    Bedrest Beginning at 1015am  Manual:   Yes.    Patient Status During Pull:  stable  Post Pull Groin Site:  Level 0  Post Pull Instructions Given:  Yes.    Post Pull Pulses Present:  Yes.    Dressing Applied:  Yes.    Comments:   

## 2017-08-02 NOTE — Discharge Instructions (Signed)

## 2017-08-03 ENCOUNTER — Other Ambulatory Visit: Payer: Self-pay | Admitting: *Deleted

## 2017-08-03 ENCOUNTER — Encounter (HOSPITAL_COMMUNITY): Payer: Self-pay | Admitting: Cardiovascular Disease

## 2017-08-04 ENCOUNTER — Telehealth: Payer: Self-pay | Admitting: *Deleted

## 2017-08-04 DIAGNOSIS — Z01818 Encounter for other preprocedural examination: Secondary | ICD-10-CM

## 2017-08-04 DIAGNOSIS — I739 Peripheral vascular disease, unspecified: Secondary | ICD-10-CM

## 2017-08-04 NOTE — Telephone Encounter (Signed)
-----   Message from Wellington Hampshire, MD sent at 08/02/2017 12:47 PM EDT ----- The patient needs to have an aortobifemoral bypass.please schedule her for a [facility] for pre-op evaluation.

## 2017-08-04 NOTE — Telephone Encounter (Signed)
Spoke with pt, lexiscan scheduled. Instructions discussed with the patient.

## 2017-08-08 ENCOUNTER — Telehealth (HOSPITAL_COMMUNITY): Payer: Self-pay

## 2017-08-08 NOTE — Telephone Encounter (Signed)
Encounter complete. 

## 2017-08-09 ENCOUNTER — Ambulatory Visit (HOSPITAL_COMMUNITY)
Admission: RE | Admit: 2017-08-09 | Discharge: 2017-08-09 | Disposition: A | Discharge status: Home / Self Care | Payer: BLUE CROSS/BLUE SHIELD | Source: Ambulatory Visit | Attending: Cardiovascular Disease | Admitting: Cardiovascular Disease

## 2017-08-09 DIAGNOSIS — R5383 Other fatigue: Secondary | ICD-10-CM | POA: Diagnosis not present

## 2017-08-09 DIAGNOSIS — I739 Peripheral vascular disease, unspecified: Secondary | ICD-10-CM | POA: Insufficient documentation

## 2017-08-09 DIAGNOSIS — Z85048 Personal history of other malignant neoplasm of rectum, rectosigmoid junction, and anus: Secondary | ICD-10-CM | POA: Diagnosis not present

## 2017-08-09 DIAGNOSIS — Z01818 Encounter for other preprocedural examination: Secondary | ICD-10-CM | POA: Diagnosis not present

## 2017-08-09 DIAGNOSIS — E119 Type 2 diabetes mellitus without complications: Secondary | ICD-10-CM | POA: Diagnosis not present

## 2017-08-09 DIAGNOSIS — Z87891 Personal history of nicotine dependence: Secondary | ICD-10-CM | POA: Insufficient documentation

## 2017-08-09 DIAGNOSIS — Z8249 Family history of ischemic heart disease and other diseases of the circulatory system: Secondary | ICD-10-CM | POA: Diagnosis not present

## 2017-08-09 DIAGNOSIS — E039 Hypothyroidism, unspecified: Secondary | ICD-10-CM | POA: Insufficient documentation

## 2017-08-09 LAB — MYOCARDIAL PERFUSION IMAGING
CHL CUP NUCLEAR SDS: 0
CHL CUP NUCLEAR SSS: 0
CHL CUP RESTING HR STRESS: 62 {beats}/min
LV dias vol: 86 mL (ref 46–106)
LV sys vol: 48 mL
NUC STRESS TID: 1.14
Peak HR: 139 {beats}/min
SRS: 0

## 2017-08-09 MED ORDER — TECHNETIUM TC 99M TETROFOSMIN IV KIT
32.1000 | PACK | Freq: Once | INTRAVENOUS | Status: AC | PRN
  Administered 2017-08-09: 32.1 via INTRAVENOUS
  Filled 2017-08-09: qty 33

## 2017-08-09 MED ORDER — REGADENOSON 0.4 MG/5ML IV SOLN
0.4000 mg | Freq: Once | INTRAVENOUS | Status: AC
  Administered 2017-08-09: 0.4 mg via INTRAVENOUS

## 2017-08-09 MED ORDER — TECHNETIUM TC 99M TETROFOSMIN IV KIT
10.2000 | PACK | Freq: Once | INTRAVENOUS | Status: AC | PRN
  Administered 2017-08-09: 10.2 via INTRAVENOUS
  Filled 2017-08-09: qty 11

## 2017-08-09 MED ORDER — AMINOPHYLLINE 25 MG/ML IV SOLN
75.0000 mg | Freq: Once | INTRAVENOUS | Status: AC
  Administered 2017-08-09: 75 mg via INTRAVENOUS

## 2017-08-10 ENCOUNTER — Ambulatory Visit: Payer: BLUE CROSS/BLUE SHIELD | Admitting: Family Medicine

## 2017-08-10 ENCOUNTER — Encounter: Payer: Self-pay | Admitting: Family Medicine

## 2017-08-11 ENCOUNTER — Encounter: Payer: Self-pay | Admitting: Family Medicine

## 2017-08-11 ENCOUNTER — Encounter (HOSPITAL_COMMUNITY): Payer: Self-pay | Admitting: *Deleted

## 2017-08-11 ENCOUNTER — Ambulatory Visit (INDEPENDENT_AMBULATORY_CARE_PROVIDER_SITE_OTHER): Payer: BLUE CROSS/BLUE SHIELD | Admitting: Family Medicine

## 2017-08-11 VITALS — BP 124/76 | HR 74 | Temp 98.0°F | Wt 103.0 lb

## 2017-08-11 DIAGNOSIS — K0889 Other specified disorders of teeth and supporting structures: Secondary | ICD-10-CM

## 2017-08-11 MED ORDER — AMOXICILLIN-POT CLAVULANATE 875-125 MG PO TABS: 1.0000 | ORAL_TABLET | Freq: Two times a day (BID) | ORAL | 0 refills | Status: DC

## 2017-08-11 NOTE — Progress Notes (Signed)
HPI:  Acute visit for Tooth Pain: -started yesterday -hx dental infection -upper back molar - pain, hurts to chew, dental work lower a few weeks ago -dentist office closed -vasc doc told her needs to get on abx for this as vasc procedure planned for next week  -no fevers, or tenderness in this, drooling or difficulty swallowing  ROS: See pertinent positives and negatives per HPI.  Past Medical History:  Diagnosis Date  . Allergy   . Alopecia   . 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 Anxiety   . B12 deficiency anemia 09/14/2015  . Blood transfusion without reported diagnosis   . Chronic back pain   . Chronic daily headache 03/29/2013   takes bc powder  . Closed right hip fracture ([place]) 09/10/2015  . Depression   . DM (diabetes mellitus) ([place])    diet controlled  . GERD (gastroesophageal reflux disease)   . HLD (hyperlipidemia)   . Hot flashes   . Hyperlipemia 04/11/2014  . Hypokalemia 05/2016  . Hypothyroidism 09/10/2015  . IBS (irritable bowel syndrome) 03/29/2013  . 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 Tubular adenoma of colon 09/08/2003  . Vertigo     Past Surgical History:  Procedure Laterality Date  . ABDOMINAL AORTOGRAM N/A 08/02/2017   Procedure: ABDOMINAL AORTOGRAM;  Surgeon: Wellington Hampshire, MD;  Location: [facility];  Service: Cardiovascular;  Laterality: N/A;  . COLONOSCOPY    . ECTOPIC PREGNANCY SURGERY    . FLEXIBLE SIGMOIDOSCOPY N/A 08/14/2013   Procedure: FLEXIBLE SIGMOIDOSCOPY;  Surgeon: Ladene Artist, MD;  [facility];  Service: Endoscopy;  Laterality: N/A;  . LOWER EXTREMITY ANGIOGRAPHY Bilateral 08/02/2017   Procedure: Lower Extremity Angiography;  Surgeon: Wellington Hampshire, MD;  Location: [facility];  Service: Cardiovascular;  Laterality: Bilateral;  . PILONIDAL CYST EXCISION    . POLYPECTOMY    . TONSILLECTOMY    . TOTAL HIP ARTHROPLASTY   09/11/2015   Procedure: TOTAL HIP ARTHROPLASTY;  Surgeon: Renette Butters, MD;  Location: [facility];  Service: Orthopedics;;    Family History  Problem Relation Age of Onset  . Arthritis Mother   . Hyperlipidemia Mother   . Heart disease Mother   . Hypertension Mother   . Stroke Mother 42  . Irritable bowel syndrome Mother   . Thyroid disease Mother   . Heart attack Mother   . Heart disease Father   . Hyperlipidemia Father   . Hypertension Father   . Stroke Father 13  . Thyroid disease Father   . Prostate cancer Father   . Heart attack Father   . Lung cancer Brother   . Heart attack Maternal Grandfather   . Heart attack Maternal Uncle   . Colon cancer Neg Hx   . Rectal cancer Neg Hx   . Stomach cancer Neg Hx     Social History   Social History  . Marital status: Married    Spouse name: N/A  . Number of children: 0  . Years of education: N/A   Occupational History  . caregiver    Social History Main Topics  . Smoking status: Former Smoker    Packs/day: 0.50    Types: Cigarettes    Quit date: 10/07/2014  . Smokeless tobacco: Never Used  . Alcohol use No  . Drug use: No  . Sexual activity: Yes    Partners: Male   Other  Topics Concern  . None   Social History Narrative   Work or School: homemaker      Home Situation: lives with her husband,[name] takes care of her elderly parents       Spiritual Beliefs: Christian      Lifestyle: no regular exercise, poor diet                 Current Outpatient Prescriptions:  .  acyclovir ointment (ZOVIRAX) 5 %, Apply 1 application topically every 3 (three) hours. (Patient taking differently: Apply 1 application topically every 3 (three) hours as needed (for fever blister). ), Disp: 15 g, Rfl: 1 .  aspirin EC 81 MG tablet, Take 81 mg by mouth daily., Disp: , Rfl:  .  Biotin 10000 MCG TABS, Take 10,000 mcg by mouth daily., Disp: , Rfl:  .  dicyclomine (BENTYL) 10 MG capsule, TAKE ONE CAPSULE BY MOUTH THREE TIMES  DAILY BEFORE MEALS. DUE FOR FOLLOW UP IN MAY, NEEDS TO SCHEDULE AN APPOINTMENT (Patient taking differently: TAKE ONE CAPSULE BY MOUTH THREE TIMES DAILY BEFORE MEALS), Disp: 270 capsule, Rfl: 1 .  fluticasone (FLONASE) 50 MCG/ACT nasal spray, USE 1 SPRAY IN EACH NOSTRIL DAILY (Patient taking differently: USE 1 SPRAY IN EACH NOSTRIL DAILY AT BEDTMIE), Disp: 16 g, Rfl: 5 .  gabapentin (NEURONTIN) 600 MG tablet, TAKE 1 TABLET BY MOUTH THREE TIMES DAILY GENERIC EQUIVALENT FOR NEURONTIN, Disp: 270 tablet, Rfl: 1 .  ibuprofen (ADVIL,MOTRIN) 200 MG tablet, Take 800 mg by mouth 4 (four) times daily as needed (for pain.)., Disp: , Rfl:  .  levothyroxine (SYNTHROID, LEVOTHROID) 75 MCG tablet, TAKE 1 TABLET BY MOUTH DAILY GENERIC EQUIVALENT FOR SYNTHROID, Disp: 90 tablet, Rfl: 1 .  LINZESS 145 MCG CAPS capsule, TAKE ONE CAPSULE BY MOUTH DAILY BEFORE BREAKFAST, Disp: 90 capsule, Rfl: 3 .  meclizine (ANTIVERT) 12.5 MG tablet, Take 1 tablet (12.5 mg total) by mouth 3 (three) times daily as needed for dizziness., Disp: 30 tablet, Rfl: 0 .  Multiple Vitamins-Minerals (PRESERVISION AREDS 2 PO), Take 1 capsule by mouth daily., Disp: , Rfl:  .  nystatin-triamcinolone (MYCOLOG II) cream, APPLY TO CORNERS OF THE MOUTH TWICE A DAY AS NEEDED FOR CRACKING/BURNING, Disp: , Rfl: 2 .  omeprazole (PRILOSEC) 40 MG capsule, TAKE ONE CAPSULE BY MOUTH DAILY. NEED APPOINTMENT FOR FURTHER REFILLS, Disp: 90 capsule, Rfl: 1 .  ondansetron (ZOFRAN) 4 MG tablet, Take 1 tablet (4 mg total) by mouth every 8 (eight) hours as needed for nausea or vomiting., Disp: 30 tablet, Rfl: 0 .  rosuvastatin (CRESTOR) 20 MG tablet, Take 1 tablet (20 mg total) by mouth daily., Disp: 30 tablet, Rfl: 2 .  vitamin B-12 1000 MCG tablet, Take 1 tablet (1,000 mcg total) by mouth daily., Disp: 30 tablet, Rfl: 0 .  amoxicillin-clavulanate (AUGMENTIN) 875-125 MG tablet, Take 1 tablet by mouth 2 (two) times daily., Disp: 14 tablet, Rfl: 0  EXAM:  Vitals:    08/11/17 1339  BP: 124/76  Pulse: 74  Temp: 98 F (36.7 C)  SpO2: 98%    Body mass index is 18.25 kg/m.  GENERAL: vitals reviewed and listed above, alert, oriented, appears well hydrated and in no acute distress  HEENT: atraumatic, conjunttiva clear, no obvious abnormalities on inspection of external nose and ears -  Significant findings include mild edema and erythema of the gums around the upper back 2 molars, possible filling missing her cavity in the second to last molar in the upper back, some lymphadenopathy submental on  the left, no pitting or obvious purulent discharge, pain with tapping on the 2 upper back molars on the left  NECK: no obvious masses on inspection  LUNGS: clear to auscultation bilaterally, no wheezes, rales or rhonchi, good air movement  CV: HRRR, no peripheral edema  MS: moves all extremities without noticeable abnormality  PSYCH: pleasant and cooperative, no obvious depression or anxiety  ASSESSMENT AND PLAN:  Discussed the following assessment and plan:  Pain, dental  -she reports history of dental infection and given antibiotics in the past for similar symptoms -Cannot get in with her dentist today and going into the weekend with vasculars urgery planned next week -Opted to start antibiotic and advised follow-up with her dentist as well -Patient advised to return or notify a doctor immediately if symptoms worsen or persist or new concerns arise.  Patient Instructions  The antibiotic was sent to the pharmacy.  Please follow up with your dentist.    [name], DO

## 2017-08-11 NOTE — Progress Notes (Signed)
Pt denies SOB, chest pain, and being under the care of a cardiologist. Pt denies having a cardiac cath. Pt denies having a chest x ray within the last year. Pt made aware to stop taking vitamins, fish oil herbal medications Biotin.  Do not take any NSAIDs ie: Ibuprofen, Advil, Naproxen (Aleve), Motrin, BC and Goody Powder. Pt verbalized understanding of all pre-op instructions.

## 2017-08-11 NOTE — Patient Instructions (Signed)
The antibiotic was sent to the pharmacy.  Please follow up with your dentist.

## 2017-08-13 NOTE — Anesthesia Preprocedure Evaluation (Addendum)
Anesthesia Evaluation  Patient identified by MRN, date of birth, ID band Patient awake    Reviewed: Allergy & Precautions, NPO status , Patient's Chart, lab work & pertinent test results  History of Anesthesia Complications Negative for: history of anesthetic complications  Airway Mallampati: II  TM Distance: >3 FB Neck ROM: Full    Dental  (+) Dental Advisory Given   Pulmonary COPD, former smoker (quit 2015),    breath sounds clear to auscultation       Cardiovascular (-) angina+ Peripheral Vascular Disease   Rhythm:Regular Rate:Normal  08/09/17 Stress: normal perfusion '17 ECHO: EF 55-60%, valves OK   Neuro/Psych  Headaches, Anxiety Depression    GI/Hepatic Neg liver ROS, GERD  Medicated and Controlled,Anal cancer: s/p XRT and chemo   Endo/Other  diabetes (diet controlled)Hypothyroidism   Renal/GU negative Renal ROS     Musculoskeletal   Abdominal   Peds  Hematology  (+) Blood dyscrasia (Hb 10.5), anemia ,   Anesthesia Other Findings   Reproductive/Obstetrics                            Anesthesia Physical Anesthesia Plan  ASA: III  Anesthesia Plan: General   Post-op Pain Management:    Induction: Intravenous  PONV Risk Score and Plan: 4 or greater and Ondansetron, Dexamethasone and Midazolam  Airway Management Planned: Oral ETT  Additional Equipment: Arterial line, PA Cath and Ultrasound Guidance Line Placement  Intra-op Plan:   Post-operative Plan: Possible Post-op intubation/ventilation  Informed Consent: I have reviewed the patients History and Physical, chart, labs and discussed the procedure including the risks, benefits and alternatives for the proposed anesthesia with the patient or authorized representative who has indicated his/her understanding and acceptance.   Dental advisory given  Plan Discussed with: CRNA and Surgeon  Anesthesia Plan Comments: (Plan  routine monitors, A line, PA catheter, GETA with possible post op ventilation)        Anesthesia Quick Evaluation

## 2017-08-14 ENCOUNTER — Inpatient Hospital Stay (HOSPITAL_COMMUNITY): Payer: BLUE CROSS/BLUE SHIELD | Admitting: Anesthesiology

## 2017-08-14 ENCOUNTER — Inpatient Hospital Stay (HOSPITAL_COMMUNITY): Payer: BLUE CROSS/BLUE SHIELD

## 2017-08-14 ENCOUNTER — Inpatient Hospital Stay (HOSPITAL_COMMUNITY)
Admission: RE | Admit: 2017-08-14 | Discharge: 2017-08-20 | DRG: 271 | Disposition: A | Discharge status: Home Health | Payer: BLUE CROSS/BLUE SHIELD | Source: Ambulatory Visit | Attending: Vascular Surgery | Admitting: Vascular Surgery

## 2017-08-14 ENCOUNTER — Inpatient Hospital Stay (HOSPITAL_COMMUNITY): Payer: BLUE CROSS/BLUE SHIELD | Admitting: Certified Registered Nurse Anesthetist

## 2017-08-14 ENCOUNTER — Encounter (HOSPITAL_COMMUNITY)
Admission: RE | Disposition: A | Discharge status: Home Health | Payer: Self-pay | Source: Ambulatory Visit | Attending: Vascular Surgery

## 2017-08-14 DIAGNOSIS — Z79899 Other long term (current) drug therapy: Secondary | ICD-10-CM

## 2017-08-14 DIAGNOSIS — I70201 Unspecified atherosclerosis of native arteries of extremities, right leg: Secondary | ICD-10-CM | POA: Diagnosis present

## 2017-08-14 DIAGNOSIS — I700 Atherosclerosis of aorta: Secondary | ICD-10-CM | POA: Diagnosis present

## 2017-08-14 DIAGNOSIS — L280 Lichen simplex chronicus: Secondary | ICD-10-CM | POA: Diagnosis present

## 2017-08-14 DIAGNOSIS — I714 Abdominal aortic aneurysm, without rupture: Secondary | ICD-10-CM | POA: Diagnosis present

## 2017-08-14 DIAGNOSIS — F419 Anxiety disorder, unspecified: Secondary | ICD-10-CM | POA: Diagnosis present

## 2017-08-14 DIAGNOSIS — E039 Hypothyroidism, unspecified: Secondary | ICD-10-CM | POA: Diagnosis present

## 2017-08-14 DIAGNOSIS — J449 Chronic obstructive pulmonary disease, unspecified: Secondary | ICD-10-CM | POA: Diagnosis present

## 2017-08-14 DIAGNOSIS — E785 Hyperlipidemia, unspecified: Secondary | ICD-10-CM | POA: Diagnosis present

## 2017-08-14 DIAGNOSIS — Z9221 Personal history of antineoplastic chemotherapy: Secondary | ICD-10-CM

## 2017-08-14 DIAGNOSIS — E876 Hypokalemia: Secondary | ICD-10-CM | POA: Diagnosis not present

## 2017-08-14 DIAGNOSIS — Z888 Allergy status to other drugs, medicaments and biological substances status: Secondary | ICD-10-CM

## 2017-08-14 DIAGNOSIS — Z923 Personal history of irradiation: Secondary | ICD-10-CM

## 2017-08-14 DIAGNOSIS — Z881 Allergy status to other antibiotic agents status: Secondary | ICD-10-CM | POA: Diagnosis not present

## 2017-08-14 DIAGNOSIS — I70212 Atherosclerosis of native arteries of extremities with intermittent claudication, left leg: Secondary | ICD-10-CM | POA: Diagnosis not present

## 2017-08-14 DIAGNOSIS — Z96649 Presence of unspecified artificial hip joint: Secondary | ICD-10-CM | POA: Diagnosis present

## 2017-08-14 DIAGNOSIS — Z85048 Personal history of other malignant neoplasm of rectum, rectosigmoid junction, and anus: Secondary | ICD-10-CM

## 2017-08-14 DIAGNOSIS — E1151 Type 2 diabetes mellitus with diabetic peripheral angiopathy without gangrene: Secondary | ICD-10-CM | POA: Diagnosis present

## 2017-08-14 DIAGNOSIS — F329 Major depressive disorder, single episode, unspecified: Secondary | ICD-10-CM | POA: Diagnosis present

## 2017-08-14 DIAGNOSIS — I998 Other disorder of circulatory system: Secondary | ICD-10-CM | POA: Diagnosis not present

## 2017-08-14 DIAGNOSIS — Z8349 Family history of other endocrine, nutritional and metabolic diseases: Secondary | ICD-10-CM

## 2017-08-14 DIAGNOSIS — Z7982 Long term (current) use of aspirin: Secondary | ICD-10-CM

## 2017-08-14 DIAGNOSIS — Z8249 Family history of ischemic heart disease and other diseases of the circulatory system: Secondary | ICD-10-CM

## 2017-08-14 DIAGNOSIS — I7409 Other arterial embolism and thrombosis of abdominal aorta: Secondary | ICD-10-CM | POA: Diagnosis present

## 2017-08-14 DIAGNOSIS — D62 Acute posthemorrhagic anemia: Secondary | ICD-10-CM | POA: Diagnosis not present

## 2017-08-14 DIAGNOSIS — I9789 Other postprocedural complications and disorders of the circulatory system, not elsewhere classified: Secondary | ICD-10-CM | POA: Diagnosis not present

## 2017-08-14 DIAGNOSIS — K589 Irritable bowel syndrome without diarrhea: Secondary | ICD-10-CM | POA: Diagnosis present

## 2017-08-14 DIAGNOSIS — I70222 Atherosclerosis of native arteries of extremities with rest pain, left leg: Secondary | ICD-10-CM | POA: Diagnosis present

## 2017-08-14 DIAGNOSIS — K219 Gastro-esophageal reflux disease without esophagitis: Secondary | ICD-10-CM | POA: Diagnosis present

## 2017-08-14 DIAGNOSIS — Z87891 Personal history of nicotine dependence: Secondary | ICD-10-CM | POA: Diagnosis not present

## 2017-08-14 HISTORY — DX: Other arterial embolism and thrombosis of abdominal aorta: I74.09

## 2017-08-14 HISTORY — DX: Presence of spectacles and contact lenses: Z97.3

## 2017-08-14 HISTORY — DX: Personal history of other diseases of the digestive system: Z87.19

## 2017-08-14 HISTORY — PX: AORTA - BILATERAL FEMORAL ARTERY BYPASS GRAFT: SHX1175

## 2017-08-14 HISTORY — PX: FEMORAL-POPLITEAL BYPASS GRAFT: SHX937

## 2017-08-14 HISTORY — PX: THROMBECTOMY FEMORAL ARTERY: SHX6406

## 2017-08-14 HISTORY — PX: EMBOLECTOMY: SHX44

## 2017-08-14 LAB — COMPREHENSIVE METABOLIC PANEL
ALBUMIN: 3.5 g/dL (ref 3.5–5.0)
ALT: 9 U/L — ABNORMAL LOW (ref 14–54)
ANION GAP: 10 (ref 5–15)
AST: 13 U/L — ABNORMAL LOW (ref 15–41)
Alkaline Phosphatase: 116 U/L (ref 38–126)
BUN: 9 mg/dL (ref 6–20)
CHLORIDE: 108 mmol/L (ref 101–111)
CO2: 22 mmol/L (ref 22–32)
Calcium: 9.2 mg/dL (ref 8.9–10.3)
Creatinine, Ser: 0.56 mg/dL (ref 0.44–1.00)
Glucose, Bld: 85 mg/dL (ref 65–99)
Potassium: 2.7 mmol/L — CL (ref 3.5–5.1)
SODIUM: 140 mmol/L (ref 135–145)
Total Bilirubin: 0.5 mg/dL (ref 0.3–1.2)
Total Protein: 6.5 g/dL (ref 6.5–8.1)

## 2017-08-14 LAB — POCT I-STAT 7, (LYTES, BLD GAS, ICA,H+H)
ACID-BASE DEFICIT: 3 mmol/L — AB (ref 0.0–2.0)
BICARBONATE: 23.1 mmol/L (ref 20.0–28.0)
Calcium, Ion: 1.13 mmol/L — ABNORMAL LOW (ref 1.15–1.40)
HCT: 27 % — ABNORMAL LOW (ref 36.0–46.0)
HEMOGLOBIN: 9.2 g/dL — AB (ref 12.0–15.0)
O2 Saturation: 99 %
PCO2 ART: 47.8 mmHg (ref 32.0–48.0)
PH ART: 7.291 — AB (ref 7.350–7.450)
PO2 ART: 156 mmHg — AB (ref 83.0–108.0)
Potassium: 3.3 mmol/L — ABNORMAL LOW (ref 3.5–5.1)
Sodium: 144 mmol/L (ref 135–145)
TCO2: 25 mmol/L (ref 22–32)

## 2017-08-14 LAB — CBC
HCT: 31.6 % — ABNORMAL LOW (ref 36.0–46.0)
HEMATOCRIT: 28.7 % — AB (ref 36.0–46.0)
HEMOGLOBIN: 10.5 g/dL — AB (ref 12.0–15.0)
Hemoglobin: 9.5 g/dL — ABNORMAL LOW (ref 12.0–15.0)
MCH: 30.2 pg (ref 26.0–34.0)
MCH: 30 pg (ref 26.0–34.0)
MCHC: 33.2 g/dL (ref 30.0–36.0)
MCHC: 33.1 g/dL (ref 30.0–36.0)
MCV: 90.8 fL (ref 78.0–100.0)
MCV: 90.5 fL (ref 78.0–100.0)
Platelets: 364 10*3/uL (ref 150–400)
Platelets: 151 10*3/uL (ref 150–400)
RBC: 3.48 MIL/uL — AB (ref 3.87–5.11)
RBC: 3.17 MIL/uL — ABNORMAL LOW (ref 3.87–5.11)
RDW: 13.2 % (ref 11.5–15.5)
RDW: 13.9 % (ref 11.5–15.5)
WBC: 9 10*3/uL (ref 4.0–10.5)
WBC: 17.3 10*3/uL — ABNORMAL HIGH (ref 4.0–10.5)

## 2017-08-14 LAB — POCT I-STAT 4, (NA,K, GLUC, HGB,HCT)
GLUCOSE: 120 mg/dL — AB (ref 65–99)
Glucose, Bld: 124 mg/dL — ABNORMAL HIGH (ref 65–99)
HCT: 27 % — ABNORMAL LOW (ref 36.0–46.0)
HCT: 25 % — ABNORMAL LOW (ref 36.0–46.0)
HEMOGLOBIN: 8.5 g/dL — AB (ref 12.0–15.0)
Hemoglobin: 9.2 g/dL — ABNORMAL LOW (ref 12.0–15.0)
POTASSIUM: 2.8 mmol/L — AB (ref 3.5–5.1)
Potassium: 3.2 mmol/L — ABNORMAL LOW (ref 3.5–5.1)
SODIUM: 145 mmol/L (ref 135–145)
Sodium: 144 mmol/L (ref 135–145)

## 2017-08-14 LAB — HEMOGLOBIN A1C
Hgb A1c MFr Bld: 5.3 % (ref 4.8–5.6)
MEAN PLASMA GLUCOSE: 105.41 mg/dL

## 2017-08-14 LAB — PROTIME-INR
INR: 0.95
INR: 2.87
PROTHROMBIN TIME: 12.6 s (ref 11.4–15.2)
Prothrombin Time: 29.9 seconds — ABNORMAL HIGH (ref 11.4–15.2)

## 2017-08-14 LAB — BLOOD GAS, ARTERIAL
Acid-Base Excess: 2 mmol/L (ref 0.0–2.0)
Acid-base deficit: 3.5 mmol/L — ABNORMAL HIGH (ref 0.0–2.0)
BICARBONATE: 21.6 mmol/L (ref 20.0–28.0)
Bicarbonate: 24.6 mmol/L (ref 20.0–28.0)
FIO2: 21
O2 Content: 2.5 L/min
O2 SAT: 97.1 %
O2 Saturation: 98.8 %
PATIENT TEMPERATURE: 98.6
PCO2 ART: 29.1 mmHg — AB (ref 32.0–48.0)
PH ART: 7.322 — AB (ref 7.350–7.450)
Patient temperature: 98.6
pCO2 arterial: 43 mmHg (ref 32.0–48.0)
pH, Arterial: 7.537 — ABNORMAL HIGH (ref 7.350–7.450)
pO2, Arterial: 153 mmHg — ABNORMAL HIGH (ref 83.0–108.0)
pO2, Arterial: 172 mmHg — ABNORMAL HIGH (ref 83.0–108.0)

## 2017-08-14 LAB — GLUCOSE, CAPILLARY
GLUCOSE-CAPILLARY: 94 mg/dL (ref 65–99)
GLUCOSE-CAPILLARY: 154 mg/dL — AB (ref 65–99)
GLUCOSE-CAPILLARY: 177 mg/dL — AB (ref 65–99)

## 2017-08-14 LAB — APTT
APTT: 33 s (ref 24–36)
aPTT: 59 seconds — ABNORMAL HIGH (ref 24–36)

## 2017-08-14 LAB — SURGICAL PCR SCREEN
MRSA, PCR: NEGATIVE
STAPHYLOCOCCUS AUREUS: NEGATIVE

## 2017-08-14 LAB — PREPARE RBC (CROSSMATCH)

## 2017-08-14 SURGERY — EMBOLECTOMY
Anesthesia: General

## 2017-08-14 SURGERY — CREATION, BYPASS, ARTERIAL, AORTA TO FEMORAL, BILATERAL, USING GRAFT
Anesthesia: General | Site: Leg Upper | Laterality: Right

## 2017-08-14 SURGERY — BYPASS GRAFT FEMORAL-POPLITEAL ARTERY
Anesthesia: General | Site: Leg Upper | Laterality: Right

## 2017-08-14 MED ORDER — ONDANSETRON HCL 4 MG/2ML IJ SOLN
INTRAMUSCULAR | Status: DC | PRN
  Administered 2017-08-14: 4 mg via INTRAVENOUS

## 2017-08-14 MED ORDER — LACTATED RINGERS IV SOLN
INTRAVENOUS | Status: DC | PRN
  Administered 2017-08-14: 07:00:00 via INTRAVENOUS

## 2017-08-14 MED ORDER — PANTOPRAZOLE SODIUM 40 MG IV SOLR
40.0000 mg | Freq: Every day | INTRAVENOUS | Status: DC
  Administered 2017-08-15 – 2017-08-16 (×2): 40 mg via INTRAVENOUS
  Filled 2017-08-14 (×2): qty 40

## 2017-08-14 MED ORDER — ACETAMINOPHEN 325 MG RE SUPP
325.0000 mg | RECTAL | Status: DC | PRN
  Filled 2017-08-14: qty 2

## 2017-08-14 MED ORDER — SODIUM CHLORIDE 0.9 % IV SOLN
INTRAVENOUS | Status: DC | PRN
  Administered 2017-08-14: 10:00:00 via INTRAVENOUS

## 2017-08-14 MED ORDER — ROCURONIUM BROMIDE 100 MG/10ML IV SOLN
INTRAVENOUS | Status: DC | PRN
  Administered 2017-08-14: 50 mg via INTRAVENOUS

## 2017-08-14 MED ORDER — CEFUROXIME SODIUM 1.5 G IV SOLR
1.5000 g | INTRAVENOUS | Status: AC
  Administered 2017-08-14: 1.5 g via INTRAVENOUS

## 2017-08-14 MED ORDER — SODIUM CHLORIDE 0.9 % IV SOLN
Freq: Once | INTRAVENOUS | Status: AC
  Administered 2017-08-14: 22:00:00 via INTRAVENOUS

## 2017-08-14 MED ORDER — FENTANYL CITRATE (PF) 250 MCG/5ML IJ SOLN
INTRAMUSCULAR | Status: DC | PRN
  Administered 2017-08-14 (×2): 50 ug via INTRAVENOUS
  Administered 2017-08-14: 200 ug via INTRAVENOUS
  Administered 2017-08-14 (×4): 50 ug via INTRAVENOUS
  Administered 2017-08-14: 25 ug via INTRAVENOUS
  Administered 2017-08-14 (×2): 50 ug via INTRAVENOUS
  Administered 2017-08-14: 25 ug via INTRAVENOUS
  Administered 2017-08-14: 50 ug via INTRAVENOUS

## 2017-08-14 MED ORDER — ONDANSETRON HCL 4 MG/2ML IJ SOLN
4.0000 mg | Freq: Four times a day (QID) | INTRAMUSCULAR | Status: DC | PRN
  Administered 2017-08-15 – 2017-08-20 (×4): 4 mg via INTRAVENOUS
  Filled 2017-08-14 (×4): qty 2

## 2017-08-14 MED ORDER — FENTANYL CITRATE (PF) 250 MCG/5ML IJ SOLN
INTRAMUSCULAR | Status: AC
  Filled 2017-08-14: qty 5

## 2017-08-14 MED ORDER — PHENYLEPHRINE HCL 10 MG/ML IJ SOLN
INTRAMUSCULAR | Status: DC | PRN
  Administered 2017-08-14: 80 ug via INTRAVENOUS
  Administered 2017-08-14: 40 ug via INTRAVENOUS
  Administered 2017-08-14 (×2): 80 ug via INTRAVENOUS

## 2017-08-14 MED ORDER — SUGAMMADEX SODIUM 200 MG/2ML IV SOLN
INTRAVENOUS | Status: DC | PRN
  Administered 2017-08-14: 100 mg via INTRAVENOUS

## 2017-08-14 MED ORDER — 0.9 % SODIUM CHLORIDE (POUR BTL) OPTIME
TOPICAL | Status: DC | PRN
  Administered 2017-08-14: 3000 mL

## 2017-08-14 MED ORDER — CEFUROXIME SODIUM 1.5 G IV SOLR
INTRAVENOUS | Status: AC
  Filled 2017-08-14: qty 1.5

## 2017-08-14 MED ORDER — PROPOFOL 10 MG/ML IV BOLUS
INTRAVENOUS | Status: AC
  Filled 2017-08-14: qty 20

## 2017-08-14 MED ORDER — MORPHINE SULFATE (PF) 4 MG/ML IV SOLN
2.0000 mg | INTRAVENOUS | Status: DC | PRN
  Administered 2017-08-14 – 2017-08-16 (×5): 2 mg via INTRAVENOUS
  Filled 2017-08-14 (×5): qty 1

## 2017-08-14 MED ORDER — SODIUM CHLORIDE 0.9 % IJ SOLN
INTRAMUSCULAR | Status: AC
  Filled 2017-08-14: qty 10

## 2017-08-14 MED ORDER — HEPARIN SODIUM (PORCINE) 1000 UNIT/ML IJ SOLN
INTRAMUSCULAR | Status: AC
  Filled 2017-08-14: qty 1

## 2017-08-14 MED ORDER — PHENYLEPHRINE 40 MCG/ML (10ML) SYRINGE FOR IV PUSH (FOR BLOOD PRESSURE SUPPORT)
PREFILLED_SYRINGE | INTRAVENOUS | Status: AC
  Filled 2017-08-14: qty 10

## 2017-08-14 MED ORDER — PHENYLEPHRINE 40 MCG/ML (10ML) SYRINGE FOR IV PUSH (FOR BLOOD PRESSURE SUPPORT)
PREFILLED_SYRINGE | INTRAVENOUS | Status: DC | PRN
  Administered 2017-08-14: 120 ug via INTRAVENOUS
  Administered 2017-08-14 (×2): 80 ug via INTRAVENOUS
  Administered 2017-08-14: 120 ug via INTRAVENOUS
  Administered 2017-08-14: 80 ug via INTRAVENOUS
  Administered 2017-08-14: 40 ug via INTRAVENOUS
  Administered 2017-08-14: 80 ug via INTRAVENOUS
  Administered 2017-08-14: 120 ug via INTRAVENOUS
  Administered 2017-08-14: 40 ug via INTRAVENOUS
  Administered 2017-08-14: 80 ug via INTRAVENOUS

## 2017-08-14 MED ORDER — FENTANYL CITRATE (PF) 100 MCG/2ML IJ SOLN
INTRAMUSCULAR | Status: DC | PRN
  Administered 2017-08-14: 100 ug via INTRAVENOUS
  Administered 2017-08-14 (×2): 25 ug via INTRAVENOUS
  Administered 2017-08-14: 50 ug via INTRAVENOUS

## 2017-08-14 MED ORDER — 0.9 % SODIUM CHLORIDE (POUR BTL) OPTIME
TOPICAL | Status: DC | PRN
Start: 2017-08-14 — End: 2017-08-14
  Administered 2017-08-14: 2000 mL

## 2017-08-14 MED ORDER — SUCCINYLCHOLINE CHLORIDE 200 MG/10ML IV SOSY
PREFILLED_SYRINGE | INTRAVENOUS | Status: DC | PRN
  Administered 2017-08-14: 80 mg via INTRAVENOUS

## 2017-08-14 MED ORDER — SUGAMMADEX SODIUM 200 MG/2ML IV SOLN
INTRAVENOUS | Status: DC | PRN
  Administered 2017-08-14: 200 mg via INTRAVENOUS

## 2017-08-14 MED ORDER — HYDRALAZINE HCL 20 MG/ML IJ SOLN
5.0000 mg | INTRAMUSCULAR | Status: DC | PRN
  Administered 2017-08-14: 5 mg via INTRAVENOUS

## 2017-08-14 MED ORDER — ACYCLOVIR 5 % EX OINT
1.0000 "application " | TOPICAL_OINTMENT | CUTANEOUS | Status: DC | PRN
  Filled 2017-08-14: qty 15

## 2017-08-14 MED ORDER — CHLORHEXIDINE GLUCONATE CLOTH 2 % EX PADS: 6.0000 | MEDICATED_PAD | Freq: Once | CUTANEOUS | Status: DC

## 2017-08-14 MED ORDER — ROCURONIUM BROMIDE 10 MG/ML (PF) SYRINGE
PREFILLED_SYRINGE | INTRAVENOUS | Status: AC
  Filled 2017-08-14: qty 5

## 2017-08-14 MED ORDER — PHENOL 1.4 % MT LIQD: 1.0000 | OROMUCOSAL | Status: DC | PRN

## 2017-08-14 MED ORDER — SODIUM CHLORIDE 0.9 % IV SOLN
INTRAVENOUS | Status: DC | PRN
  Administered 2017-08-14: 500 mL

## 2017-08-14 MED ORDER — SUGAMMADEX SODIUM 200 MG/2ML IV SOLN
INTRAVENOUS | Status: AC
  Filled 2017-08-14: qty 2

## 2017-08-14 MED ORDER — MIDAZOLAM HCL 5 MG/5ML IJ SOLN
INTRAMUSCULAR | Status: DC | PRN
  Administered 2017-08-14 (×2): 0.5 mg via INTRAVENOUS
  Administered 2017-08-14: 1 mg via INTRAVENOUS

## 2017-08-14 MED ORDER — POTASSIUM CHLORIDE 10 MEQ/50ML IV SOLN
10.0000 meq | INTRAVENOUS | Status: AC
  Administered 2017-08-14: 10 meq via INTRAVENOUS
  Filled 2017-08-14: qty 50

## 2017-08-14 MED ORDER — DEXAMETHASONE SODIUM PHOSPHATE 10 MG/ML IJ SOLN
INTRAMUSCULAR | Status: AC
  Filled 2017-08-14: qty 1

## 2017-08-14 MED ORDER — SUCCINYLCHOLINE CHLORIDE 200 MG/10ML IV SOSY
PREFILLED_SYRINGE | INTRAVENOUS | Status: AC
  Filled 2017-08-14: qty 10

## 2017-08-14 MED ORDER — MEPERIDINE HCL 25 MG/ML IJ SOLN: 6.2500 mg | INTRAMUSCULAR | Status: DC | PRN

## 2017-08-14 MED ORDER — ROCURONIUM BROMIDE 10 MG/ML (PF) SYRINGE
PREFILLED_SYRINGE | INTRAVENOUS | Status: DC | PRN
  Administered 2017-08-14: 30 mg via INTRAVENOUS

## 2017-08-14 MED ORDER — PHENYLEPHRINE 40 MCG/ML (10ML) SYRINGE FOR IV PUSH (FOR BLOOD PRESSURE SUPPORT)
PREFILLED_SYRINGE | INTRAVENOUS | Status: DC | PRN
  Administered 2017-08-14 (×2): 80 ug via INTRAVENOUS

## 2017-08-14 MED ORDER — PROPOFOL 10 MG/ML IV BOLUS
INTRAVENOUS | Status: DC | PRN
  Administered 2017-08-14: 100 mg via INTRAVENOUS

## 2017-08-14 MED ORDER — 0.9 % SODIUM CHLORIDE (POUR BTL) OPTIME
TOPICAL | Status: DC | PRN
  Administered 2017-08-14: 2000 mL

## 2017-08-14 MED ORDER — PROPOFOL 10 MG/ML IV BOLUS
INTRAVENOUS | Status: DC | PRN
  Administered 2017-08-14: 50 mg via INTRAVENOUS

## 2017-08-14 MED ORDER — MIDAZOLAM HCL 2 MG/2ML IJ SOLN
INTRAMUSCULAR | Status: AC
  Filled 2017-08-14: qty 2

## 2017-08-14 MED ORDER — HEPARIN SOD (PORK) LOCK FLUSH 100 UNIT/ML IV SOLN
INTRAVENOUS | Status: AC
  Filled 2017-08-14: qty 5

## 2017-08-14 MED ORDER — PANTOPRAZOLE SODIUM 40 MG IV SOLR: 40.0000 mg | Freq: Every day | INTRAVENOUS | Status: DC

## 2017-08-14 MED ORDER — PHENYLEPHRINE HCL 10 MG/ML IJ SOLN
INTRAVENOUS | Status: DC | PRN
  Administered 2017-08-14: 30 ug/min via INTRAVENOUS

## 2017-08-14 MED ORDER — HEPARIN SODIUM (PORCINE) 1000 UNIT/ML IJ SOLN
INTRAMUSCULAR | Status: DC | PRN
  Administered 2017-08-14: 5000 [IU] via INTRAVENOUS
  Administered 2017-08-14: 3000 [IU] via INTRAVENOUS

## 2017-08-14 MED ORDER — CEFAZOLIN SODIUM 1 G IJ SOLR
INTRAMUSCULAR | Status: AC
  Filled 2017-08-14: qty 10

## 2017-08-14 MED ORDER — MAGNESIUM SULFATE 2 GM/50ML IV SOLN
2.0000 g | Freq: Once | INTRAVENOUS | Status: AC | PRN
  Administered 2017-08-15: 2 g via INTRAVENOUS
  Filled 2017-08-14: qty 50

## 2017-08-14 MED ORDER — MIDAZOLAM HCL 2 MG/2ML IJ SOLN: 0.5000 mg | Freq: Once | INTRAMUSCULAR | Status: DC | PRN

## 2017-08-14 MED ORDER — HEPARIN SODIUM (PORCINE) 1000 UNIT/ML IJ SOLN
INTRAMUSCULAR | Status: DC | PRN
  Administered 2017-08-14: 5000 [IU] via INTRAVENOUS
  Administered 2017-08-14: 2000 [IU] via INTRAVENOUS

## 2017-08-14 MED ORDER — PROMETHAZINE HCL 25 MG/ML IJ SOLN: 6.2500 mg | INTRAMUSCULAR | Status: DC | PRN

## 2017-08-14 MED ORDER — PROTAMINE SULFATE 10 MG/ML IV SOLN
INTRAVENOUS | Status: DC | PRN
  Administered 2017-08-14: 50 mg via INTRAVENOUS

## 2017-08-14 MED ORDER — DOCUSATE SODIUM 100 MG PO CAPS
100.0000 mg | ORAL_CAPSULE | Freq: Every day | ORAL | Status: DC
  Administered 2017-08-16 – 2017-08-18 (×3): 100 mg via ORAL
  Filled 2017-08-14 (×4): qty 1

## 2017-08-14 MED ORDER — SODIUM CHLORIDE 0.9 % IV SOLN
INTRAVENOUS | Status: DC | PRN
Start: 2017-08-14 — End: 2017-08-14
  Administered 2017-08-14: 16:00:00

## 2017-08-14 MED ORDER — BISACODYL 10 MG RE SUPP: 10.0000 mg | Freq: Every day | RECTAL | Status: DC | PRN

## 2017-08-14 MED ORDER — CEFAZOLIN SODIUM-DEXTROSE 2-3 GM-% IV SOLR
INTRAVENOUS | Status: DC | PRN
  Administered 2017-08-14: 2 g via INTRAVENOUS

## 2017-08-14 MED ORDER — LABETALOL HCL 5 MG/ML IV SOLN
INTRAVENOUS | Status: AC
  Filled 2017-08-14: qty 4

## 2017-08-14 MED ORDER — DEXTROSE 5 % IV SOLN
1.5000 g | Freq: Two times a day (BID) | INTRAVENOUS | Status: AC
  Administered 2017-08-15 (×2): 1.5 g via INTRAVENOUS
  Filled 2017-08-14 (×2): qty 1.5

## 2017-08-14 MED ORDER — MANNITOL 20 % IV SOLN: INTRAVENOUS | Status: DC | PRN

## 2017-08-14 MED ORDER — SUGAMMADEX SODIUM 200 MG/2ML IV SOLN
INTRAVENOUS | Status: DC | PRN
Start: 2017-08-14 — End: 2017-08-14
  Administered 2017-08-14: 200 mg via INTRAVENOUS

## 2017-08-14 MED ORDER — EPHEDRINE SULFATE-NACL 50-0.9 MG/10ML-% IV SOSY: PREFILLED_SYRINGE | INTRAVENOUS | Status: DC | PRN

## 2017-08-14 MED ORDER — PROTAMINE SULFATE 10 MG/ML IV SOLN
INTRAVENOUS | Status: DC | PRN
  Administered 2017-08-14: 20 mg via INTRAVENOUS
  Administered 2017-08-14 (×3): 10 mg via INTRAVENOUS

## 2017-08-14 MED ORDER — METOPROLOL TARTRATE 5 MG/5ML IV SOLN
2.0000 mg | INTRAVENOUS | Status: DC | PRN
  Administered 2017-08-16: 2.5 mg via INTRAVENOUS

## 2017-08-14 MED ORDER — POTASSIUM CHLORIDE CRYS ER 20 MEQ PO TBCR
20.0000 meq | EXTENDED_RELEASE_TABLET | Freq: Once | ORAL | Status: AC | PRN
  Administered 2017-08-18: 40 meq via ORAL
  Filled 2017-08-14: qty 2

## 2017-08-14 MED ORDER — PHENYLEPHRINE 40 MCG/ML (10ML) SYRINGE FOR IV PUSH (FOR BLOOD PRESSURE SUPPORT)
PREFILLED_SYRINGE | INTRAVENOUS | Status: AC
  Filled 2017-08-14: qty 20

## 2017-08-14 MED ORDER — FENTANYL CITRATE (PF) 100 MCG/2ML IJ SOLN
INTRAMUSCULAR | Status: DC | PRN
  Administered 2017-08-14: 50 ug via INTRAVENOUS

## 2017-08-14 MED ORDER — LACTATED RINGERS IV SOLN
INTRAVENOUS | Status: DC | PRN
  Administered 2017-08-14: 16:00:00 via INTRAVENOUS

## 2017-08-14 MED ORDER — HEPARIN SODIUM (PORCINE) 1000 UNIT/ML IJ SOLN
INTRAMUSCULAR | Status: DC | PRN
  Administered 2017-08-14: 5000 [IU] via INTRAVENOUS

## 2017-08-14 MED ORDER — PHENYLEPHRINE HCL 10 MG/ML IJ SOLN
INTRAVENOUS | Status: DC | PRN
  Administered 2017-08-14: 20 ug/min via INTRAVENOUS

## 2017-08-14 MED ORDER — ROCURONIUM BROMIDE 10 MG/ML (PF) SYRINGE
PREFILLED_SYRINGE | INTRAVENOUS | Status: AC
Start: 2017-08-14 — End: 2017-08-14
  Filled 2017-08-14: qty 5

## 2017-08-14 MED ORDER — LACTATED RINGERS IV SOLN
INTRAVENOUS | Status: DC | PRN
  Administered 2017-08-14 (×2): via INTRAVENOUS

## 2017-08-14 MED ORDER — LABETALOL HCL 5 MG/ML IV SOLN
INTRAVENOUS | Status: DC | PRN
  Administered 2017-08-14 (×3): 5 mg via INTRAVENOUS

## 2017-08-14 MED ORDER — NYSTATIN-TRIAMCINOLONE 100000-0.1 UNIT/GM-% EX CREA
TOPICAL_CREAM | Freq: Two times a day (BID) | CUTANEOUS | Status: DC | PRN
  Filled 2017-08-14: qty 15

## 2017-08-14 MED ORDER — MANNITOL 25 % IV SOLN
INTRAVENOUS | Status: DC | PRN
  Administered 2017-08-14: 25 g via INTRAVENOUS

## 2017-08-14 MED ORDER — DEXTROSE-NACL 5-0.45 % IV SOLN
INTRAVENOUS | Status: DC
  Administered 2017-08-15: 01:00:00 via INTRAVENOUS

## 2017-08-14 MED ORDER — PANTOPRAZOLE SODIUM 40 MG PO TBEC
40.0000 mg | DELAYED_RELEASE_TABLET | Freq: Every day | ORAL | Status: DC
  Administered 2017-08-17 – 2017-08-19 (×3): 40 mg via ORAL
  Filled 2017-08-14 (×3): qty 1

## 2017-08-14 MED ORDER — SODIUM CHLORIDE 0.9 % IV SOLN: INTRAVENOUS | Status: DC

## 2017-08-14 MED ORDER — MORPHINE SULFATE (PF) 4 MG/ML IV SOLN
INTRAVENOUS | Status: AC
  Administered 2017-08-14: 2 mg via INTRAVENOUS
  Filled 2017-08-14: qty 1

## 2017-08-14 MED ORDER — GUAIFENESIN-DM 100-10 MG/5ML PO SYRP: 15.0000 mL | ORAL_SOLUTION | ORAL | Status: DC | PRN

## 2017-08-14 MED ORDER — HYDROMORPHONE HCL 1 MG/ML IJ SOLN
0.2500 mg | INTRAMUSCULAR | Status: DC | PRN
  Administered 2017-08-14 (×2): 0.5 mg via INTRAVENOUS

## 2017-08-14 MED ORDER — GLYCOPYRROLATE 0.2 MG/ML IV SOSY
PREFILLED_SYRINGE | INTRAVENOUS | Status: DC | PRN
  Administered 2017-08-14: .2 mg via INTRAVENOUS

## 2017-08-14 MED ORDER — ONDANSETRON HCL 4 MG/2ML IJ SOLN
INTRAMUSCULAR | Status: AC
  Filled 2017-08-14: qty 2

## 2017-08-14 MED ORDER — DEXAMETHASONE SODIUM PHOSPHATE 10 MG/ML IJ SOLN
INTRAMUSCULAR | Status: DC | PRN
  Administered 2017-08-14: 10 mg via INTRAVENOUS

## 2017-08-14 MED ORDER — FENTANYL CITRATE (PF) 100 MCG/2ML IJ SOLN: 25.0000 ug | INTRAMUSCULAR | Status: DC | PRN

## 2017-08-14 MED ORDER — PROTAMINE SULFATE 10 MG/ML IV SOLN
INTRAVENOUS | Status: AC
  Filled 2017-08-14: qty 5

## 2017-08-14 MED ORDER — LABETALOL HCL 5 MG/ML IV SOLN: 10.0000 mg | INTRAVENOUS | Status: DC | PRN

## 2017-08-14 MED ORDER — ROCURONIUM BROMIDE 10 MG/ML (PF) SYRINGE
PREFILLED_SYRINGE | INTRAVENOUS | Status: DC | PRN
  Administered 2017-08-14 (×2): 10 mg via INTRAVENOUS
  Administered 2017-08-14: 50 mg via INTRAVENOUS

## 2017-08-14 MED ORDER — EPHEDRINE SULFATE-NACL 50-0.9 MG/10ML-% IV SOSY
PREFILLED_SYRINGE | INTRAVENOUS | Status: DC | PRN
  Administered 2017-08-14 (×2): 10 mg via INTRAVENOUS

## 2017-08-14 MED ORDER — MUPIROCIN 2 % EX OINT
TOPICAL_OINTMENT | CUTANEOUS | Status: AC
  Filled 2017-08-14: qty 22

## 2017-08-14 MED ORDER — LACTATED RINGERS IV SOLN
INTRAVENOUS | Status: DC | PRN
  Administered 2017-08-14: 22:00:00 via INTRAVENOUS

## 2017-08-14 MED ORDER — CEFAZOLIN SODIUM-DEXTROSE 1-4 GM/50ML-% IV SOLN
INTRAVENOUS | Status: DC | PRN
  Administered 2017-08-14: 1 g via INTRAVENOUS

## 2017-08-14 MED ORDER — LIDOCAINE 2% (20 MG/ML) 5 ML SYRINGE
INTRAMUSCULAR | Status: DC | PRN
  Administered 2017-08-14: 60 mg via INTRAVENOUS

## 2017-08-14 MED ORDER — METOPROLOL TARTRATE 5 MG/5ML IV SOLN
2.5000 mg | Freq: Four times a day (QID) | INTRAVENOUS | Status: DC
  Administered 2017-08-16 – 2017-08-20 (×16): 2.5 mg via INTRAVENOUS
  Filled 2017-08-14 (×19): qty 5

## 2017-08-14 MED ORDER — LIDOCAINE 2% (20 MG/ML) 5 ML SYRINGE
INTRAMUSCULAR | Status: AC
  Filled 2017-08-14: qty 5

## 2017-08-14 MED ORDER — SODIUM CHLORIDE 0.9 % IV SOLN: 500.0000 mL | Freq: Once | INTRAVENOUS | Status: DC | PRN

## 2017-08-14 MED ORDER — LACTATED RINGERS IV SOLN
INTRAVENOUS | Status: DC | PRN
  Administered 2017-08-14 (×2): via INTRAVENOUS

## 2017-08-14 MED ORDER — PANTOPRAZOLE SODIUM 40 MG PO TBEC: 40.0000 mg | DELAYED_RELEASE_TABLET | Freq: Every day | ORAL | Status: DC

## 2017-08-14 MED ORDER — STERILE WATER FOR IRRIGATION IR SOLN
Status: DC | PRN
  Administered 2017-08-14: 1000 mL

## 2017-08-14 MED ORDER — EPHEDRINE SULFATE 50 MG/ML IJ SOLN
INTRAMUSCULAR | Status: AC
  Filled 2017-08-14: qty 1

## 2017-08-14 MED ORDER — ACETAMINOPHEN 325 MG PO TABS
325.0000 mg | ORAL_TABLET | ORAL | Status: DC | PRN
  Administered 2017-08-17 – 2017-08-18 (×2): 650 mg via ORAL
  Filled 2017-08-14 (×3): qty 2

## 2017-08-14 MED ORDER — MUPIROCIN 2 % EX OINT
1.0000 "application " | TOPICAL_OINTMENT | Freq: Once | CUTANEOUS | Status: AC
  Administered 2017-08-14: 1 via TOPICAL

## 2017-08-14 MED ORDER — HYDROMORPHONE HCL 1 MG/ML IJ SOLN
INTRAMUSCULAR | Status: AC
  Administered 2017-08-14: 0.5 mg via INTRAVENOUS
  Filled 2017-08-14: qty 1

## 2017-08-14 MED ORDER — HYDRALAZINE HCL 20 MG/ML IJ SOLN
INTRAMUSCULAR | Status: AC
  Administered 2017-08-14: 5 mg via INTRAVENOUS
  Filled 2017-08-14: qty 1

## 2017-08-14 SURGICAL SUPPLY — 51 items
ADH SKN CLS APL DERMABOND .7 (GAUZE/BANDAGES/DRESSINGS) ×5
BANDAGE ESMARK 6X9 LF (GAUZE/BANDAGES/DRESSINGS) IMPLANT
BNDG CMPR 9X6 STRL LF SNTH (GAUZE/BANDAGES/DRESSINGS)
BNDG ESMARK 6X9 LF (GAUZE/BANDAGES/DRESSINGS)
CANISTER SUCT 3000ML PPV (MISCELLANEOUS) ×2 IMPLANT
CANNULA VESSEL 3MM 2 BLNT TIP (CANNULA) ×4 IMPLANT
CLIP LIGATING EXTRA MED SLVR (CLIP) ×2 IMPLANT
CLIP LIGATING EXTRA SM BLUE (MISCELLANEOUS) ×2 IMPLANT
CUFF TOURNIQUET SINGLE 34IN LL (TOURNIQUET CUFF) IMPLANT
CUFF TOURNIQUET SINGLE 44IN (TOURNIQUET CUFF) IMPLANT
DERMABOND ADVANCED (GAUZE/BANDAGES/DRESSINGS) ×5
DERMABOND ADVANCED .7 DNX12 (GAUZE/BANDAGES/DRESSINGS) ×1 IMPLANT
DRAIN SNY 10X20 3/4 PERF (WOUND CARE) IMPLANT
DRAPE HALF SHEET 40X57 (DRAPES) ×1 IMPLANT
DRAPE X-RAY CASS 24X20 (DRAPES) IMPLANT
ELECT REM PT RETURN 9FT ADLT (ELECTROSURGICAL) ×2
ELECTRODE REM PT RTRN 9FT ADLT (ELECTROSURGICAL) ×1 IMPLANT
EVACUATOR SILICONE 100CC (DRAIN) IMPLANT
GAUZE SPONGE 4X4 12PLY STRL (GAUZE/BANDAGES/DRESSINGS) ×2 IMPLANT
GLOVE BIOGEL PI IND STRL 6.5 (GLOVE) IMPLANT
GLOVE BIOGEL PI IND STRL 7.0 (GLOVE) IMPLANT
GLOVE BIOGEL PI IND STRL 7.5 (GLOVE) IMPLANT
GLOVE BIOGEL PI INDICATOR 6.5 (GLOVE) ×1
GLOVE BIOGEL PI INDICATOR 7.0 (GLOVE) ×2
GLOVE BIOGEL PI INDICATOR 7.5 (GLOVE) ×1
GLOVE ECLIPSE 7.0 STRL STRAW (GLOVE) ×1 IMPLANT
GLOVE SS BIOGEL STRL SZ 7.5 (GLOVE) ×1 IMPLANT
GLOVE SUPERSENSE BIOGEL SZ 7.5 (GLOVE) ×1
GLOVE SURG SS PI 6.5 STRL IVOR (GLOVE) ×1 IMPLANT
GOWN STRL REUS W/ TWL LRG LVL3 (GOWN DISPOSABLE) ×3 IMPLANT
GOWN STRL REUS W/TWL LRG LVL3 (GOWN DISPOSABLE) ×6
INSERT FOGARTY SM (MISCELLANEOUS) IMPLANT
KIT BASIN OR (CUSTOM PROCEDURE TRAY) ×2 IMPLANT
KIT ROOM TURNOVER OR (KITS) ×2 IMPLANT
NS IRRIG 1000ML POUR BTL (IV SOLUTION) ×4 IMPLANT
PACK PERIPHERAL VASCULAR (CUSTOM PROCEDURE TRAY) ×2 IMPLANT
PAD ARMBOARD 7.5X6 YLW CONV (MISCELLANEOUS) ×4 IMPLANT
PADDING CAST COTTON 6X4 STRL (CAST SUPPLIES) IMPLANT
SET COLLECT BLD 21X3/4 12 (NEEDLE) IMPLANT
STOPCOCK 4 WAY LG BORE MALE ST (IV SETS) IMPLANT
SUT ETHILON 3 0 PS 1 (SUTURE) IMPLANT
SUT PROLENE 5 0 C 1 24 (SUTURE) ×2 IMPLANT
SUT PROLENE 6 0 CC (SUTURE) ×4 IMPLANT
SUT SILK 2 0 SH (SUTURE) ×2 IMPLANT
SUT VIC AB 2-0 CTX 36 (SUTURE) ×4 IMPLANT
SUT VIC AB 3-0 SH 27 (SUTURE) ×6
SUT VIC AB 3-0 SH 27X BRD (SUTURE) ×2 IMPLANT
TRAY FOLEY W/METER SILVER 16FR (SET/KITS/TRAYS/PACK) ×1 IMPLANT
TUBING EXTENTION W/L.L. (IV SETS) IMPLANT
UNDERPAD 30X30 (UNDERPADS AND DIAPERS) ×2 IMPLANT
WATER STERILE IRR 1000ML POUR (IV SOLUTION) ×2 IMPLANT

## 2017-08-14 SURGICAL SUPPLY — 54 items
ADH SKN CLS APL DERMABOND .7 (GAUZE/BANDAGES/DRESSINGS) ×2
BAG DECANTER FOR FLEXI CONT (MISCELLANEOUS) ×1 IMPLANT
BANDAGE ESMARK 6X9 LF (GAUZE/BANDAGES/DRESSINGS) IMPLANT
BNDG CMPR 9X6 STRL LF SNTH (GAUZE/BANDAGES/DRESSINGS)
BNDG ESMARK 6X9 LF (GAUZE/BANDAGES/DRESSINGS)
CANISTER SUCT 3000ML PPV (MISCELLANEOUS) ×2 IMPLANT
CANNULA VESSEL 3MM 2 BLNT TIP (CANNULA) ×2 IMPLANT
CATH EMB 3FR 80CM (CATHETERS) IMPLANT
CATH EMB 4FR 80CM (CATHETERS) ×1 IMPLANT
CATH EMB 5FR 80CM (CATHETERS) IMPLANT
CLIP LIGATING EXTRA MED SLVR (CLIP) ×2 IMPLANT
CLIP LIGATING EXTRA SM BLUE (MISCELLANEOUS) ×2 IMPLANT
CUFF TOURNIQUET SINGLE 34IN LL (TOURNIQUET CUFF) IMPLANT
CUFF TOURNIQUET SINGLE 44IN (TOURNIQUET CUFF) IMPLANT
DERMABOND ADVANCED (GAUZE/BANDAGES/DRESSINGS) ×2
DERMABOND ADVANCED .7 DNX12 (GAUZE/BANDAGES/DRESSINGS) ×1 IMPLANT
DRAIN SNY 10X20 3/4 PERF (WOUND CARE) IMPLANT
DRAPE X-RAY CASS 24X20 (DRAPES) IMPLANT
ELECT REM PT RETURN 9FT ADLT (ELECTROSURGICAL) ×2
ELECTRODE REM PT RTRN 9FT ADLT (ELECTROSURGICAL) ×1 IMPLANT
EVACUATOR SILICONE 100CC (DRAIN) IMPLANT
GLOVE BIOGEL PI IND STRL 6.5 (GLOVE) IMPLANT
GLOVE BIOGEL PI INDICATOR 6.5 (GLOVE) ×3
GLOVE SS BIOGEL STRL SZ 7.5 (GLOVE) ×1 IMPLANT
GLOVE SUPERSENSE BIOGEL SZ 7.5 (GLOVE) ×1
GLOVE SURG SS PI 6.5 STRL IVOR (GLOVE) ×3 IMPLANT
GOWN STRL NON-REIN LRG LVL3 (GOWN DISPOSABLE) ×1 IMPLANT
GOWN STRL REUS W/ TWL LRG LVL3 (GOWN DISPOSABLE) ×3 IMPLANT
GOWN STRL REUS W/TWL LRG LVL3 (GOWN DISPOSABLE) ×6
KIT BASIN OR (CUSTOM PROCEDURE TRAY) ×2 IMPLANT
KIT ROOM TURNOVER OR (KITS) ×2 IMPLANT
NS IRRIG 1000ML POUR BTL (IV SOLUTION) ×4 IMPLANT
PACK PERIPHERAL VASCULAR (CUSTOM PROCEDURE TRAY) ×2 IMPLANT
PAD ARMBOARD 7.5X6 YLW CONV (MISCELLANEOUS) ×4 IMPLANT
PADDING CAST COTTON 6X4 STRL (CAST SUPPLIES) IMPLANT
SET COLLECT BLD 21X3/4 12 (NEEDLE) IMPLANT
STAPLER VISISTAT 35W (STAPLE) IMPLANT
STOPCOCK 4 WAY LG BORE MALE ST (IV SETS) IMPLANT
SUT ETHILON 3 0 PS 1 (SUTURE) IMPLANT
SUT MNCRL AB 4-0 PS2 18 (SUTURE) ×1 IMPLANT
SUT PROLENE 5 0 C 1 24 (SUTURE) ×2 IMPLANT
SUT PROLENE 6 0 CC (SUTURE) ×4 IMPLANT
SUT SILK 2 0 SH (SUTURE) ×1 IMPLANT
SUT VIC AB 2-0 CT1 27 (SUTURE) ×2
SUT VIC AB 2-0 CT1 36 (SUTURE) ×1 IMPLANT
SUT VIC AB 2-0 CT1 TAPERPNT 27 (SUTURE) IMPLANT
SUT VIC AB 2-0 CTX 36 (SUTURE) ×2 IMPLANT
SUT VIC AB 3-0 SH 27 (SUTURE) ×4
SUT VIC AB 3-0 SH 27X BRD (SUTURE) ×1 IMPLANT
SYR 3ML LL SCALE MARK (SYRINGE) ×2 IMPLANT
TRAY FOLEY W/METER SILVER 16FR (SET/KITS/TRAYS/PACK) ×1 IMPLANT
TUBING EXTENTION W/L.L. (IV SETS) IMPLANT
UNDERPAD 30X30 (UNDERPADS AND DIAPERS) ×2 IMPLANT
WATER STERILE IRR 1000ML POUR (IV SOLUTION) ×2 IMPLANT

## 2017-08-14 SURGICAL SUPPLY — 62 items
ADH SKN CLS APL DERMABOND .7 (GAUZE/BANDAGES/DRESSINGS) ×6
CANISTER SUCT 3000ML PPV (MISCELLANEOUS) ×3 IMPLANT
CANNULA VESSEL 3MM 2 BLNT TIP (CANNULA) ×6 IMPLANT
CATH EMB 3FR 80CM (CATHETERS) ×1 IMPLANT
CLIP LIGATING EXTRA MED SLVR (CLIP) ×4 IMPLANT
CLIP LIGATING EXTRA SM BLUE (MISCELLANEOUS) ×4 IMPLANT
CLIP TI MEDIUM 6 (CLIP) ×1 IMPLANT
COVER BACK TABLE 60X90IN (DRAPES) ×3 IMPLANT
DERMABOND ADVANCED (GAUZE/BANDAGES/DRESSINGS) ×3
DERMABOND ADVANCED .7 DNX12 (GAUZE/BANDAGES/DRESSINGS) ×4 IMPLANT
DRAPE BILATERAL SPLIT (DRAPES) IMPLANT
DRAPE CV SPLIT W-CLR ANES SCRN (DRAPES) IMPLANT
DRSG COVADERM 4X14 (GAUZE/BANDAGES/DRESSINGS) ×1 IMPLANT
ELECT BLADE 4.0 EZ CLEAN MEGAD (MISCELLANEOUS) ×3
ELECT REM PT RETURN 9FT ADLT (ELECTROSURGICAL) ×3
ELECTRODE BLDE 4.0 EZ CLN MEGD (MISCELLANEOUS) IMPLANT
ELECTRODE REM PT RTRN 9FT ADLT (ELECTROSURGICAL) ×2 IMPLANT
FELT TEFLON 1X6 (MISCELLANEOUS) ×1 IMPLANT
GLOVE BIO SURGEON STRL SZ 6.5 (GLOVE) ×4 IMPLANT
GLOVE BIOGEL PI IND STRL 6.5 (GLOVE) IMPLANT
GLOVE BIOGEL PI INDICATOR 6.5 (GLOVE) ×5
GLOVE ECLIPSE 7.5 STRL STRAW (GLOVE) ×2 IMPLANT
GLOVE SS BIOGEL STRL SZ 7.5 (GLOVE) ×2 IMPLANT
GLOVE SUPERSENSE BIOGEL SZ 7.5 (GLOVE) ×2
GOWN STRL REUS W/ TWL LRG LVL3 (GOWN DISPOSABLE) ×6 IMPLANT
GOWN STRL REUS W/ TWL XL LVL3 (GOWN DISPOSABLE) IMPLANT
GOWN STRL REUS W/TWL LRG LVL3 (GOWN DISPOSABLE) ×18
GOWN STRL REUS W/TWL XL LVL3 (GOWN DISPOSABLE) ×6
GRAFT HEMASHIELD 14X8MM (Vascular Products) ×1 IMPLANT
INSERT FOGARTY 61MM (MISCELLANEOUS) ×4 IMPLANT
INSERT FOGARTY SM (MISCELLANEOUS) ×8 IMPLANT
KIT BASIN OR (CUSTOM PROCEDURE TRAY) ×3 IMPLANT
KIT ROOM TURNOVER OR (KITS) ×3 IMPLANT
LOOP VESSEL MINI RED (MISCELLANEOUS) ×2 IMPLANT
NS IRRIG 1000ML POUR BTL (IV SOLUTION) ×6 IMPLANT
PACK AORTA (CUSTOM PROCEDURE TRAY) ×3 IMPLANT
PAD ARMBOARD 7.5X6 YLW CONV (MISCELLANEOUS) ×6 IMPLANT
PENCIL BUTTON HOLSTER BLD 10FT (ELECTRODE) ×1 IMPLANT
PUNCH AORTIC ROTATE 5MM 8IN (MISCELLANEOUS) ×1 IMPLANT
SPONGE LAP 18X18 X RAY DECT (DISPOSABLE) IMPLANT
SUT ETHIBOND 5 LR DA (SUTURE) IMPLANT
SUT PDS AB 1 TP1 54 (SUTURE) ×6 IMPLANT
SUT PROLENE 3 0 SH1 36 (SUTURE) ×5 IMPLANT
SUT PROLENE 5 0 C 1 24 (SUTURE) ×6 IMPLANT
SUT PROLENE 5 0 C 1 36 (SUTURE) IMPLANT
SUT PROLENE 6 0 CC (SUTURE) ×7 IMPLANT
SUT SILK 2 0 (SUTURE) ×3
SUT SILK 2 0 SH CR/8 (SUTURE) ×3 IMPLANT
SUT SILK 2 0 TIES 17X18 (SUTURE) ×3
SUT SILK 2-0 18XBRD TIE 12 (SUTURE) ×2 IMPLANT
SUT SILK 2-0 18XBRD TIE BLK (SUTURE) ×2 IMPLANT
SUT SILK 3 0 (SUTURE) ×3
SUT SILK 3 0 TIES 17X18 (SUTURE) ×3
SUT SILK 3-0 18XBRD TIE 12 (SUTURE) ×2 IMPLANT
SUT SILK 3-0 18XBRD TIE BLK (SUTURE) ×2 IMPLANT
SUT VIC AB 2-0 CT1 36 (SUTURE) ×7 IMPLANT
SUT VIC AB 3-0 SH 27 (SUTURE) ×18
SUT VIC AB 3-0 SH 27X BRD (SUTURE) ×8 IMPLANT
SYR 5ML LL (SYRINGE) ×1 IMPLANT
TOWEL BLUE STERILE X RAY DET (MISCELLANEOUS) ×7 IMPLANT
TRAY FOLEY W/METER SILVER 16FR (SET/KITS/TRAYS/PACK) ×3 IMPLANT
WATER STERILE IRR 1000ML POUR (IV SOLUTION) ×6 IMPLANT

## 2017-08-14 NOTE — Anesthesia Procedure Notes (Signed)
Procedure Name: Intubation Date/Time: 08/14/2017 10:03 PM Performed by: Valetta Fuller Pre-anesthesia Checklist: Patient identified, Emergency Drugs available, Suction available and Patient being monitored Patient Re-evaluated:Patient Re-evaluated prior to induction Oxygen Delivery Method: Circle System Utilized Preoxygenation: Pre-oxygenation with 100% oxygen Induction Type: IV induction Ventilation: Mask ventilation without difficulty Laryngoscope Size: Miller and 2 Grade View: Grade I Tube type: Subglottic suction tube Tube size: 7.5 mm Number of attempts: 1 Airway Equipment and Method: Stylet and Oral airway Placement Confirmation: ETT inserted through vocal cords under direct vision,  positive ETCO2 and breath sounds checked- equal and bilateral Secured at: 21 cm Tube secured with: Tape Dental Injury: Teeth and Oropharynx as per pre-operative assessment

## 2017-08-14 NOTE — Anesthesia Procedure Notes (Signed)
Procedures

## 2017-08-14 NOTE — Progress Notes (Signed)
RLE continuing to show signs of malperfusion. Writer and second Surveyor, mining both unable to locate doppler signals in DP/PT of RLE after multiple attempts. Dr. Donnetta Hutching contacted via phone to discuss. Per Dr. Donnetta Hutching - pt will return to OR for RLE Fem-Pop bypass.

## 2017-08-14 NOTE — Progress Notes (Signed)
Patient transported back to OR with CRNA staff.

## 2017-08-14 NOTE — Anesthesia Procedure Notes (Signed)
Procedure Name: Intubation Date/Time: 08/14/2017 3:48 PM Performed by: Genelle Bal Pre-anesthesia Checklist: Patient identified, Emergency Drugs available, Suction available and Patient being monitored Patient Re-evaluated:Patient Re-evaluated prior to induction Oxygen Delivery Method: Circle System Utilized Preoxygenation: Pre-oxygenation with 100% oxygen Induction Type: IV induction Ventilation: Mask ventilation without difficulty Laryngoscope Size: Miller and 2 Grade View: Grade I Tube type: Oral Tube size: 7.5 mm Number of attempts: 1 Airway Equipment and Method: Stylet and Oral airway Placement Confirmation: ETT inserted through vocal cords under direct vision,  positive ETCO2 and breath sounds checked- equal and bilateral Secured at: 22 cm Tube secured with: Tape Dental Injury: Teeth and Oropharynx as per pre-operative assessment

## 2017-08-14 NOTE — Transfer of Care (Signed)
Immediate Anesthesia Transfer of Care Note  Patient: Erin [name]  Procedure(s) Performed: Procedure(s): AORTA BIFEMORAL BYPASS GRAFT (N/A) THROMBECTOMY FEMORAL ARTERY (Right)  Patient Location: PACU  Anesthesia Type:General  Level of Consciousness: awake, alert  and patient cooperative  Airway & Oxygen Therapy: Patient Spontanous Breathing and Patient connected to nasal cannula oxygen  Post-op Assessment: Report given to RN, Post -op Vital signs reviewed and stable and Patient moving all extremities X 4  Post vital signs: Reviewed and stable  Last Vitals:  Vitals:   08/14/17 0737 08/14/17 0738  BP:    Pulse: 73 66  Resp: 19 13  Temp:    SpO2: 100% 99%    Last Pain:  Vitals:   08/14/17 0623  TempSrc: Oral         Complications: No apparent anesthesia complications

## 2017-08-14 NOTE — Progress Notes (Signed)
Dr. Donnetta Hutching at bedside to assess patient. Unable to doppler pulse on LLE. States patient will go to OR.

## 2017-08-14 NOTE — Anesthesia Preprocedure Evaluation (Signed)
Anesthesia Evaluation  Patient identified by MRN, date of birth, ID band Patient awake    Reviewed: Allergy & Precautions, NPO status , Patient's Chart, lab work & pertinent test results  Airway Mallampati: II  TM Distance: >3 FB Neck ROM: Full    Dental no notable dental hx.    Pulmonary former smoker,    Pulmonary exam normal breath sounds clear to auscultation       Cardiovascular + Peripheral Vascular Disease  Normal cardiovascular exam Rhythm:Regular Rate:Normal     Neuro/Psych  Headaches, PSYCHIATRIC DISORDERS Anxiety Depression    GI/Hepatic Neg liver ROS, hiatal hernia, GERD  ,  Endo/Other  diabetes, Type 2Hypothyroidism   Renal/GU negative Renal ROS     Musculoskeletal negative musculoskeletal ROS (+)   Abdominal   Peds  Hematology negative hematology ROS (+) anemia ,   Anesthesia Other Findings   Reproductive/Obstetrics negative OB ROS                             Anesthesia Physical  Anesthesia Plan  ASA: III and emergent  Anesthesia Plan: General   Post-op Pain Management:    Induction: Intravenous  PONV Risk Score and Plan: 2 and Ondansetron and Dexamethasone  Airway Management Planned: Oral ETT  Additional Equipment: Arterial line  Intra-op Plan:   Post-operative Plan: Possible Post-op intubation/ventilation  Informed Consent: I have reviewed the patients History and Physical, chart, labs and discussed the procedure including the risks, benefits and alternatives for the proposed anesthesia with the patient or authorized representative who has indicated his/her understanding and acceptance.   Dental advisory given  Plan Discussed with: CRNA  Anesthesia Plan Comments:         Anesthesia Quick Evaluation

## 2017-08-14 NOTE — H&P (View-Only) (Signed)
Hospital Consult    Reason for Consult:  Rest pain BLE  Requesting Physician:  Fletcher Anon MRN #:  [id number]  History of Present Illness: This is a 61 y.o. female who underwent aortogram with BLE runoff this morning by Dr. Fletcher Anon.  She has a hx of rectal cancer 4 years ago with 28 sessions of radiation therapy.  She states that this caused blisters and burned her.  She did not have any abdominal surgery.  She states that she did used to smoke, but quit when she was diagnosed with rectal cancer.    She states that her legs started bothering her around the time she was diagnosed with cancer.  Over the recent months, she has started having pain at rest in both legs but more so the right.  She also get pain in both legs with walking again right greater than left.   She underwent ABI's on 07/17/17 and revealed 0.67 on the right and 0.55 on the left.   She is on synthroid for hypothyroidism.  She takes Neurontin daily.  She is on Zetia for cholesterol management.  She is allergic to Atorvastatin, but on Crestor.  She takes a daily aspirin.   She has diet controlled diabetes.  She has hx of IBS.   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    Past Medical History:  Diagnosis Date  . Allergy   . Alopecia   . 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 Anxiety   . B12 deficiency anemia 09/14/2015  . Blood transfusion without reported diagnosis   . Chronic back pain   . Chronic daily headache 03/29/2013   takes bc powder  . Closed right hip fracture ([place]) 09/10/2015  . Depression   . DM (diabetes mellitus) ([place])    diet controlled  . GERD (gastroesophageal reflux disease)   . HLD (hyperlipidemia)   . Hot flashes   . Hyperlipemia 04/11/2014  . Hypokalemia 05/2016  . Hypothyroidism 09/10/2015  . IBS (irritable bowel syndrome) 03/29/2013  . 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 Tubular adenoma of colon 09/08/2003  . Vertigo     Past  Surgical History:  Procedure Laterality Date  . COLONOSCOPY    . ECTOPIC PREGNANCY SURGERY    . FLEXIBLE SIGMOIDOSCOPY N/A 08/14/2013   Procedure: FLEXIBLE SIGMOIDOSCOPY;  Surgeon: Ladene Artist, MD;  Location: [facility];  Service: Endoscopy;  Laterality: N/A;  . PILONIDAL CYST EXCISION    . POLYPECTOMY    . TONSILLECTOMY    . TOTAL HIP ARTHROPLASTY  09/11/2015   Procedure: TOTAL HIP ARTHROPLASTY;  Surgeon: Renette Butters, MD;  Location: [facility];  Service: Orthopedics;;    Allergies  Allergen Reactions  . Amitiza [Lubiprostone] Other (See Comments)    Uncontrollable diarrhea  . Claritin [Loratadine] Other (See Comments)    Hot flashes  . Dexamethasone Itching    Itching  . Sulfa Antibiotics Other (See Comments)    GI distress-pain  . Atorvastatin Nausea And Vomiting  . Nortriptyline Other (See Comments)    Stomach distention  . Prednisone Rash    Prior to Admission medications   Medication Sig Start Date End Date Taking? Authorizing Provider  acyclovir ointment (ZOVIRAX) 5 % Apply 1 application topically every 3 (three) hours. Patient taking differently: Apply 1 application topically every 3 (three) hours as needed (for fever blister).  08/18/16  Yes Lucretia Kern, DO  aspirin EC 81 MG  tablet Take 81 mg by mouth daily.   Yes [provider]  Biotin 10000 MCG TABS Take 10,000 mcg by mouth daily.   Yes [provider]  dicyclomine (BENTYL) 10 MG capsule TAKE ONE CAPSULE BY MOUTH THREE TIMES DAILY BEFORE MEALS. DUE FOR FOLLOW UP IN MAY, NEEDS TO SCHEDULE AN APPOINTMENT Patient taking differently: TAKE ONE CAPSULE BY MOUTH THREE TIMES DAILY BEFORE MEALS 07/03/17  Yes Ladene Artist, MD  fluticasone (FLONASE) 50 MCG/ACT nasal spray USE 1 SPRAY IN EACH NOSTRIL DAILY Patient taking differently: USE 1 SPRAY IN [facility] NOSTRIL DAILY AT BEDTMIE 01/17/17  Yes Colin [place] R, DO  gabapentin (NEURONTIN) 600 MG tablet TAKE 1 TABLET BY MOUTH THREE TIMES DAILY GENERIC  EQUIVALENT FOR NEURONTIN 04/14/17  Yes Lucretia Kern, DO  ibuprofen (ADVIL,MOTRIN) 200 MG tablet Take 800 mg by mouth 4 (four) times daily as needed (for pain.).   Yes [provider]  levothyroxine (SYNTHROID, LEVOTHROID) 75 MCG tablet TAKE 1 TABLET BY MOUTH DAILY GENERIC EQUIVALENT FOR SYNTHROID 04/14/17  Yes Lucretia Kern, DO  LINZESS 145 MCG CAPS capsule TAKE ONE CAPSULE BY MOUTH DAILY BEFORE BREAKFAST 07/18/17  Yes Ladene Artist, MD  Multiple Vitamins-Minerals (PRESERVISION AREDS 2 PO) Take 1 capsule by mouth daily.   Yes [provider]  nystatin-triamcinolone (MYCOLOG II) cream APPLY TO CORNERS OF THE MOUTH TWICE A DAY AS NEEDED FOR CRACKING/BURNING 03/30/16  Yes [provider]  omeprazole (PRILOSEC) 40 MG capsule TAKE ONE CAPSULE BY MOUTH DAILY. NEED APPOINTMENT FOR FURTHER REFILLS 07/03/17  Yes Ladene Artist, MD  rosuvastatin (CRESTOR) 20 MG tablet Take 1 tablet (20 mg total) by mouth daily. 07/18/17 10/16/17 Yes Wellington Hampshire, MD  vitamin B-12 1000 MCG tablet Take 1 tablet (1,000 mcg total) by mouth daily. 09/14/15  Yes Dhungel, Nishant, MD  ezetimibe (ZETIA) 10 MG tablet Take 1 tablet (10 mg total) by mouth daily. Patient not taking: Reported on 08/01/2017 06/15/17   Lucretia Kern, DO  meclizine (ANTIVERT) 12.5 MG tablet Take 1 tablet (12.5 mg total) by mouth 3 (three) times daily as needed for dizziness. 12/13/16   Lucretia Kern, DO  ondansetron (ZOFRAN) 4 MG tablet Take 1 tablet (4 mg total) by mouth every 8 (eight) hours as needed for nausea or vomiting. 05/30/16   Ladell Pier, MD    Social History   Social History  . Marital status: Married    Spouse name: N/A  . Number of children: 0  . Years of education: N/A   Occupational History  . caregiver    Social History Main Topics  . Smoking status: Former Smoker    Packs/day: 0.50    Types: Cigarettes    Quit date: 10/07/2014  . Smokeless tobacco: Never Used  . Alcohol use No  . Drug use:  No  . Sexual activity: Yes    Partners: Male   Other Topics Concern  . Not on file   Social History Narrative   Work or School: homemaker      Home Situation: lives with her husband,[name] takes care of her elderly parents       Spiritual Beliefs: Christian      Lifestyle: no regular exercise, poor diet                 Family History  Problem Relation Age of Onset  . Arthritis Mother   . Hyperlipidemia Mother   . Heart disease Mother   . Hypertension  Mother   . Stroke Mother 52  . Irritable bowel syndrome Mother   . Thyroid disease Mother   . Heart attack Mother   . Heart disease Father   . Hyperlipidemia Father   . Hypertension Father   . Stroke Father 34  . Thyroid disease Father   . Prostate cancer Father   . Heart attack Father   . Lung cancer Brother   . Heart attack Maternal Grandfather   . Heart attack Maternal Uncle   . Colon cancer Neg Hx   . Rectal cancer Neg Hx   . Stomach cancer Neg Hx     ROS: [x]  Positive   [ ]  Negative   [ ]  All sytems reviewed and are negative  Cardiac: []  chest pain/pressure []  palpitations []  SOB lying flat []  DOE  Vascular: [x]  pain in legs while walking [x]  pain in legs at rest [x]  pain in legs at night []  non-healing ulcers []  hx of DVT []  swelling in legs  Pulmonary: []  productive cough []  asthma/wheezing []  home O2  Neurologic: []  weakness in []  arms []  legs []  numbness in []  arms []  legs []  hx of CVA []  mini stroke [] difficulty speaking or slurred speech []  temporary loss of vision in one eye []  dizziness  Hematologic: [x]  hx of cancer-Rectal CA-see HPI []  bleeding problems []  problems with blood clotting easily  Endocrine:   [x]  diabetes-diet controlled [x]  thyroid disease  GI []  vomiting blood []  blood in stool  GU: []  CKD/renal failure []  HD--[]  M/W/F or []  T/T/S []  burning with urination []  blood in urine  Psychiatric: []  anxiety []  depression  Musculoskeletal: []   arthritis []  joint pain  Integumentary: []  rashes []  ulcers  Constitutional: []  fever []  chills   Physical Examination  Vitals:   08/02/17 0940 08/02/17 0945  BP: (!) 188/74 (!) 181/65  Pulse: (!) 45 69  Resp: 13 18  Temp:    SpO2: (!) 89% 99%   Body mass index is 18.07 kg/m.  General:  WDWN in NAD Gait: Not observed HENT: WNL, normocephalic Pulmonary: normal non-labored breathing, without Rales, rhonchi,  wheezing Cardiac: regular  Abdomen:  soft, NT/ND, no masses Skin: without rashes; skin over bilateral groin looks ok. Vascular Exam/Pulses:  Right Left  Radial 2+ (normal) 2+ (normal)  Femoral Sheath is in place 2+ (normal)  DP monophasic monophasic  PT monophasic monophasic   Extremities: without ischemic changes, without Gangrene , without cellulitis; without open wounds;  Musculoskeletal: no muscle wasting or atrophy  Neurologic: A&O X 3;  No focal weakness or paresthesias are detected; speech is fluent/normal Psychiatric:  The pt has Normal affect.  CBC    Component Value Date/Time   WBC 7.7 07/25/2017 1155   WBC 5.6 06/27/2016 1041   RBC 3.72 (L) 07/25/2017 1155   RBC 4.15 06/27/2016 1041   HGB 11.4 07/25/2017 1155   HGB 10.6 (L) 11/28/2013 1148   HCT 35.0 07/25/2017 1155   HCT 31.2 (L) 11/28/2013 1148   PLT 365 07/25/2017 1155   MCV 94 07/25/2017 1155   MCV 101.4 (H) 11/28/2013 1148   MCH 30.6 07/25/2017 1155   MCH 29.1 06/08/2016 0323   MCHC 32.6 07/25/2017 1155   MCHC 33.9 06/27/2016 1041   RDW 13.8 07/25/2017 1155   RDW 15.4 (H) 11/28/2013 1148   LYMPHSABS 1.4 07/25/2017 1155   LYMPHSABS 0.8 (L) 11/28/2013 1148   MONOABS 0.4 06/27/2016 1041   MONOABS 0.9 11/28/2013 1148   EOSABS 0.1 07/25/2017 1155  BASOSABS 0.0 07/25/2017 1155   BASOSABS 0.0 11/28/2013 1148    BMET    Component Value Date/Time   NA 139 07/25/2017 1155   NA 139 04/22/2014 1028   K 5.4 (H) 07/25/2017 1155   K 4.1 04/22/2014 1028   CL 100 07/25/2017 1155   CO2  23 07/25/2017 1155   CO2 23 04/22/2014 1028   GLUCOSE 79 07/25/2017 1155   GLUCOSE 95 05/11/2017 1018   GLUCOSE 81 04/22/2014 1028   BUN 8 07/25/2017 1155   BUN 8.8 04/22/2014 1028   CREATININE 0.76 07/25/2017 1155   CREATININE 0.8 04/22/2014 1028   CALCIUM 9.4 07/25/2017 1155   CALCIUM 9.1 04/22/2014 1028   GFRNONAA 86 07/25/2017 1155   GFRAA 99 07/25/2017 1155    COAGS: Lab Results  Component Value Date   INR 1.0 07/25/2017     Non-Invasive Vascular Imaging:   ABI's 07/17/17: Right:  0.67 Left:  0.55  Aortogram with BLE runoff by Dr. Fletcher Anon on 08/02/17:  Statin:  No. Beta Blocker:  No. Aspirin:  Yes.   ACEI:  No. ARB:  No. CCB use:  No Other antiplatelets/anticoagulants:  No.    ASSESSMENT/PLAN: This is a 61 y.o. female with BLE rest pain.   -pt underwent aortogram with BLE runoff, which revealed a small AAA with mild narrowing in the distal aorta.  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  She does have 3 vessel runoff.  She has moderate diffused disease affecting the left common iliac artery, occluded internal iliac artery, occluded proximal SFA with reconstitution distally via collaterals from the profunda with three-vessel runoff below the knee. -Dr. Donnetta Hutching is consulted for aortobifemoral bypass grafting.  Dr. Donnetta Hutching discussed this with the pt and her husband.  She is a a little greater risk for wound healing given her hx of radiation.   -She is wanting to proceed, but she will need cardiac clearance, which will be arranged by Dr. Fletcher Anon and our office will set up her surgery.     [name], PA-C [facility] [phone]  I have examined the patient, reviewed and agree with above. Reviewed arteriogram with Dr.Arida. Patient has severe rest pain in her right leg and less ischemia symptoms in her  left leg. Arteriogram reveals a small infrarenal abdominal aortic aneurysm. Does have irregularity and a small left iliac system with internal iliac occlusions bilaterally. She does have a small intermesenteric artery but it is patent. She has subtotal occlusion throughout her right iliac system and has subtotal occlusion of her common femoral artery on the right. Both superficial femoral arteries were occluded. Her deep femoral artery on the right is patent with collateralization into the above-knee popliteal and she has normal popliteal and three-vessel runoff bilaterally. Long discussion with the patient and her husband present. Agree that there is no durable endovascular option for this patient. Have recommended aortobifemoral bypass for treatment of her right leg limb threatening ischemia and claudication symptoms on the left. Explained that we would not address her superficial femoral artery occlusions and typically restoring normal flow to the groin would alleviate her symptoms. She will obtain a quick cardiac clearance and then we will schedule her for elective surgery for aortobifemoral bypass. I did discuss the magnitude of surgery with an expected one tonight intensive care stay and 5-6 day hospitalization with prolonged recovery time.        Marland Kitchen  Curt Jews, MD 08/02/2017 10:17 AM

## 2017-08-14 NOTE — Anesthesia Postprocedure Evaluation (Signed)
Anesthesia Post Note  Patient: ALBERT DEVAUL  Procedure(s) Performed: Procedure(s) (LRB): AORTA BIFEMORAL BYPASS GRAFT (N/A) THROMBECTOMY FEMORAL ARTERY (Right)     Patient location during evaluation: PACU Anesthesia Type: General Level of consciousness: sedated Pain management: pain level controlled Vital Signs Assessment: post-procedure vital signs reviewed and stable Respiratory status: spontaneous breathing, nonlabored ventilation, respiratory function stable and patient connected to nasal cannula oxygen Cardiovascular status: blood pressure returned to baseline and stable Postop Assessment: no apparent nausea or vomiting Anesthetic complications: no Comments: But decreased pulses in leg, so emergently back to OR    Last Vitals:  Vitals:   08/14/17 1445 08/14/17 1500  BP: 135/68 (!) 143/76  Pulse: (!) 59 (!) 58  Resp: 18 13  Temp:    SpO2: 100% 100%    Last Pain:  Vitals:   08/14/17 1445  TempSrc:   PainSc: Asleep                 Kayton Dunaj,E. Augustina Braddock

## 2017-08-14 NOTE — Progress Notes (Signed)
Patient ID: Erin [name], female   DOB: 08/21/1956, 61 y.o.   MRN: [id number] Hemodynamically stable in PACU. Is able to move and feel both feet. She has audible Doppler flow in her right foot but no audible flow in her left foot. This is expected since she did have very poor outflow in her right leg bypass but much better in her left with the larger patent deep femoral artery. 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 Discussed this with the patient who still is somewhat sedated from surgery. Also with her first husband explaining that this needs to be returned to the operating room for exploration and possible revision and even possible femoral to popliteal bypass. Will return to the operating room now

## 2017-08-14 NOTE — Anesthesia Postprocedure Evaluation (Signed)
Anesthesia Post Note  Patient: Erin [name]  Procedure(s) Performed: Procedure(s) (LRB): EMBOLECTOMY FEMORAL (N/A)     Patient location during evaluation: PACU Anesthesia Type: General Level of consciousness: sedated and patient cooperative Pain management: pain level controlled Vital Signs Assessment: post-procedure vital signs reviewed and stable Respiratory status: spontaneous breathing Cardiovascular status: stable Anesthetic complications: no    Last Vitals:  Vitals:   08/14/17 2020 08/14/17 2030  BP:  (!) 120/54  Pulse: 65 79  Resp: (!) 9 15  Temp:    SpO2: 100% 100%    Last Pain:  Vitals:   08/14/17 1922  TempSrc:   PainSc: 10-Worst pain ever                 Nolon Nations

## 2017-08-14 NOTE — Anesthesia Procedure Notes (Signed)
Arterial Line Insertion Start/End9/24/2018 6:40 AM, 08/14/2017 6:50 AM Performed by: Mervyn Gay, CRNA  Patient location: Pre-op. Preanesthetic checklist: patient identified, IV checked, site marked, risks and benefits discussed, surgical consent, monitors and equipment checked, pre-op evaluation, timeout performed and anesthesia consent Lidocaine 1% used for infiltration Left, radial was placed Catheter size: 20 G  Attempts: 1 Procedure performed without using ultrasound guided technique. Following insertion, Biopatch. Post procedure assessment: normal  Patient tolerated the procedure well with no immediate complications.

## 2017-08-14 NOTE — Progress Notes (Signed)
Patient ID: Erin [name], female   DOB: 06/03/1956, 61 y.o.   MRN: [id number]  Patient developed progressive ischemia right foot. She had an extremely small profunda is the only outflow. No Doppler signal in her foot. Will return to operating room tonight for right femoral to popliteal bypass with no evidence SFA occlusion. Discussed with the patient and husband present.

## 2017-08-14 NOTE — Transfer of Care (Signed)
Immediate Anesthesia Transfer of Care Note  Patient: Erin [name]  Procedure(s) Performed: Procedure(s): EMBOLECTOMY FEMORAL (N/A)  Patient Location: PACU  Anesthesia Type:General  Level of Consciousness: drowsy and patient cooperative  Airway & Oxygen Therapy: Patient Spontanous Breathing and Patient connected to nasal cannula oxygen  Post-op Assessment: Report given to RN and Patient moving all extremities X 4  Post vital signs: Reviewed and stable  Last Vitals:  Vitals:   08/14/17 1515 08/14/17 1530  BP: 122/66 124/66  Pulse: (!) 52 67  Resp: 12 12  Temp:  36.8 C  SpO2: 100% 100%    Last Pain:  Vitals:   08/14/17 1445  TempSrc:   PainSc: Asleep         Complications: No apparent anesthesia complications

## 2017-08-14 NOTE — Anesthesia Procedure Notes (Signed)
Procedure Name: Intubation Date/Time: 08/14/2017 8:11 AM Performed by: Mervyn Gay Pre-anesthesia Checklist: Patient identified, Patient being monitored, Timeout performed, Emergency Drugs available and Suction available Patient Re-evaluated:Patient Re-evaluated prior to induction Oxygen Delivery Method: Circle System Utilized Preoxygenation: Pre-oxygenation with 100% oxygen Induction Type: IV induction Ventilation: Mask ventilation without difficulty Laryngoscope Size: Miller and 2 Grade View: Grade I Tube type: Oral Tube size: 7.5 mm Number of attempts: 1 Airway Equipment and Method: Stylet Placement Confirmation: ETT inserted through vocal cords under direct vision,  positive ETCO2 and breath sounds checked- equal and bilateral Secured at: 21 cm Tube secured with: Tape Dental Injury: Teeth and Oropharynx as per pre-operative assessment

## 2017-08-14 NOTE — Progress Notes (Signed)
Patient ID: Erin [name], female   DOB: 1956-08-25, 61 y.o.   MRN: [id number] Hemodynamically stable in the 2H icu.  Audible Doppler flow dorsalis pedis bilaterally. Left foot warm and well perfused.

## 2017-08-14 NOTE — Anesthesia Preprocedure Evaluation (Signed)
Anesthesia Evaluation  Patient identified by MRN, date of birth, ID band Patient awake    Reviewed: Allergy & Precautions, NPO status , Patient's Chart, lab work & pertinent test results  Airway Mallampati: II  TM Distance: >3 FB Neck ROM: Full    Dental no notable dental hx.    Pulmonary former smoker,    Pulmonary exam normal breath sounds clear to auscultation       Cardiovascular + Peripheral Vascular Disease  Normal cardiovascular exam Rhythm:Regular Rate:Normal     Neuro/Psych negative neurological ROS  negative psych ROS   GI/Hepatic negative GI ROS, Neg liver ROS,   Endo/Other  diabetesHypothyroidism   Renal/GU negative Renal ROS  negative genitourinary   Musculoskeletal negative musculoskeletal ROS (+)   Abdominal   Peds negative pediatric ROS (+)  Hematology negative hematology ROS (+) anemia ,   Anesthesia Other Findings   Reproductive/Obstetrics negative OB ROS                             Anesthesia Physical Anesthesia Plan  ASA: III and emergent  Anesthesia Plan: General   Post-op Pain Management:    Induction: Intravenous  PONV Risk Score and Plan: 2 and Ondansetron and Dexamethasone  Airway Management Planned: Oral ETT  Additional Equipment:   Intra-op Plan:   Post-operative Plan: Possible Post-op intubation/ventilation  Informed Consent: I have reviewed the patients History and Physical, chart, labs and discussed the procedure including the risks, benefits and alternatives for the proposed anesthesia with the patient or authorized representative who has indicated his/her understanding and acceptance.   Dental advisory given  Plan Discussed with: CRNA and Surgeon  Anesthesia Plan Comments:         Anesthesia Quick Evaluation

## 2017-08-14 NOTE — Op Note (Signed)
OPERATIVE REPORT  DATE OF SURGERY: 08/14/2017  PATIENT: Erin [name], 61 y.o. 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 End 2 side of the common femoral and superficial femoral artery on the right. Common femoral artery on the left. Also had reimplantation of the inferior mesenteric artery. The left iliac graft limb  SURGEON:  Curt Jews, M.D.  PHYSICIAN ASSISTANT: Dr. Adele Barthel and [name] PA-C  ANESTHESIA:  Gen.  EBL: 600 ml  Total I/O In: 8469 [I.V.:2400; Blood:810] Out: 1060 [Urine:460; Blood:600]  BLOOD ADMINISTERED: Cell Saver  DRAINS: None  SPECIMEN: None  COUNTS CORRECT:  YES  PLAN OF CARE: PACU extubated   PATIENT DISPOSITION:  PACU - hemodynamically stable  PROCEDURE DETAILS: Patient was taken separately supraventricular the area of abdomen both groins prepped in sterile fashion. Both groins were exposed through a slightly oblique incisions. On the right this was carried down to isolate the common femoral artery at the inguinal ligament. Patient had very small arteries. Her superficial femoral artery. Be chronically occluded. She had a small deep femoral artery and this appeared to have more acute thrombus at the origin of this. Dissection was continued further down the thigh by extending the incision to assure that there was not a low takeoff of a deep femoral artery. His superficial artery was occluded throughout its course. On the left the common superficial femoral and profundus artery were isolated. The common femoral artery was patent in the deep femoral artery takeoff was further down below the inguinal ligament. Femoral artery was chronically occluded on both sides as shown. Arteriography The incision was made from the  level of xiphoid to below the umbilicus. The abdomen was opened by first opening the midline fascia with electrocautery and opening the peritoneal space. The liver was normal. The stomach and pancreas were normal. The large and small intestine were normal. The omni-Trac retractor was used for exposure. The transverse colon omentum reflected superiorly and the small bowel was inspected the right. The duodenum was mobilized off the aorta. The aorta was encircled below the level the renal arteries. The patient had a small infrarenal aortic aneurysm which had been seen in preoperative arteriography. Patient had a very small calcified aorta just above the bifurcation. The intermesenteric artery arose just at this level and was patent as was seen by preoperative arteriography. Tunnels were created from the level of the aortic bifurcation to the respective groins bilaterally and umbilical tapes were placed in these tunnels interventional tunneling of her limbs of her graft. The patient was given 5000 units intravenous heparin and 25 g of mannitol. After adequate circulation time the aorta was occluded below the level renal arteries with a Hartmann clamp and the common iliac arteries were occluded bilaterally with angled DeBakey clamps. The inferior mesenteric artery was occluded with Serafin clamp. The aorta was transected below the level of the proximal clamp and several lumbar bleeders were controlled with the ligation and dividing. The inferior mesenteric artery was divided from the aorta leaving a Correll patch there was a very tight stenosis at the origin of the inferior mesenteric artery. The the intermesenteric artery patch was endarterectomized and a 3 dilator Passed through the vessel. The distal aorta above the aortic bifurcation was oversewn with 3-0 Prolene suture. A 14 x 7 Hemashield graft was  brought onto the field and the main portion of the graft the aortic portion the graft was removed. A short main body  was left behind and was sewn into into the aorta just below the level renal arteries with a running 3-0 Prolene suture. A felt strip was used for reinforcement. This anastomosis was tested and found to be adequate. The right and left limbs of the graft was then brought through the respective tunnels down to the level of the groin. On the right leg the external iliac was occluded above the level of the inguinal ligament. The common femoral artery was opened with 11 blade and extended down onto the superficial artery. The superficial artery was chronically occluded and this was opened further distally to assure that there was no other outflow and there was not. There was what appeared be relatively acute clot in her deep femoral artery. This was opened across the origin of this which appeared to be stenotic down onto the deep femoral artery. The artery was remain small even at this level and was dissected out into several branches were dissected out and controlled with red Vesseloops. This was felt to be the only outflow through the groin. The right limb of the graft was cut to appropriate length and spatulated and sewn end-to-side arising on the junction of the common femoral artery with the toe of the graft extending onto the deep femoral artery. Prior to completion of the closure the usual flushing undertaken anastomosis completed. Graft dependent Doppler flow was noted through the deep femoral branches. Next attention was turned to the left limb of the graft. Common femoral artery normal caliber 3 occlusion of her graft. The common femoral artery was exposed and the external iliac was occluded with a Henley clamp and the distal branches were controlled with vessel loops. The deep femoral artery was controlled with a Gregory clamp. Common femoral artery was opened with 11 blade some longitudinally. The left limb of the graft was cut to appropriate length and was sewn end-to-side after speculating the graft with a  running 5-0 Prolene suture. This anastomosis was flushed and then was completed. Flow was restored and good Doppler flow was noted through the deep femoral artery. Should be noted the superficial femoral artery was chronically occluded as well. Next attention was turned back into the abdomen. The intermesenteric artery was brought into approximation of the left limb of the air to femoral graft. Left limb was reoccluded proximally and distally and an ellipse of graft was removed with a 5 aortic punch. The intermesenteric artery with a Correll patch was sewn with a running the anastomosis completed and flow was restored through the intermesenteric artery with excellent Doppler flow. Should be noted that preoperative arteriogram showed both internal iliac arteries were occluded. The patient was given 50 mg of protamine to reverse the heparin. Wounds irrigated with saline. Hemostasis cautery. Wounds were closed with 2-0 Vicryl in several layers and the groin followed by 301 closure and the skin. The abdomen was irrigated and there was no evidence of injury. The retrograde knee was closed over the aortic graft with a running 2-0 Vicryl suture The small bowel was run its entirety and found be without injury as well was placed back in the pelvis. His risk: Omentum replaced over this. The midline fascia was closed with #1 PDS suture beginning proximally and distally and tying in the middle. Skin was closed with 3-0 subcuticular stitch. Sterile dressing was applied. Patient had the dampened audible Doppler  flow in both feet was transferred to the recovery room in stable condition   Rosetta Posner, M.D., [facility] 08/14/2017 3:20 PM

## 2017-08-14 NOTE — Interval H&P Note (Signed)
History and Physical Interval Note:  08/14/2017 7:15 AM  Erin [name]  has presented today for surgery, with the diagnosis of peripheral aortic occlusive disease  The various methods of treatment have been discussed with the patient and family. After consideration of risks, benefits and other options for treatment, the patient has consented to  Procedure(s): AORTA BIFEMORAL BYPASS GRAFT (N/A) as a surgical intervention .  The patient's history has been reviewed, patient examined, no change in status, stable for surgery.  I have reviewed the patient's chart and labs.  Questions were answered to the patient's satisfaction.     Curt Jews

## 2017-08-14 NOTE — Anesthesia Procedure Notes (Addendum)
Central Venous Catheter Insertion Performed by: Roberts Gaudy, anesthesiologist Start/End9/24/2018 7:20 AM, 08/14/2017 7:30 AM Patient location: OR. Preanesthetic checklist: patient identified, IV checked, site marked, risks and benefits discussed, surgical consent, monitors and equipment checked, pre-op evaluation and timeout performed Position: Trendelenburg Hand hygiene performed , maximum sterile barriers used  and Seldinger technique used Catheter size: 9 Fr PA cath was placed.Procedure performed using ultrasound guided technique. Attempts: 2 Following insertion, line sutured, dressing applied and Biopatch. Post procedure assessment: blood return through all ports, free fluid flow and no air  Patient tolerated the procedure well with no immediate complications. Additional procedure comments: Attempted R. Internal jugular cannulation X 2 without success. Vein small, unable to thread wire. No arterial puncture.Marland Kitchen

## 2017-08-14 NOTE — Op Note (Signed)
OPERATIVE REPORT  DATE OF SURGERY: 08/14/2017  PATIENT: Erin [name], 61 y.o. female MRN: [id number]  DOB: 01-02-1956  PRE-OPERATIVE DIAGNOSIS: Severe ischemia left leg post aortobifemoral bypass  POST-OPERATIVE DIAGNOSIS:  Same  PROCEDURE: Thrombectomy of left profundus femoris artery and left femoral to above-knee popliteal bypass with translocated non-reversed great saphenous vein  SURGEON:  Curt Jews, M.D.  PHYSICIAN ASSISTANT: Gerri Lins PA-C  ANESTHESIA:  Gen.  EBL: Less than 100 ml  No intake/output data recorded.  BLOOD ADMINISTERED: None  DRAINS: None  SPECIMEN: None  COUNTS CORRECT:  YES  PLAN OF CARE: PACU   PATIENT DISPOSITION:  PACU - hemodynamically stable  PROCEDURE DETAILS: The patient had undergone aortobifemoral bypass earlier in the day for severe bilateral lower extremity ischemia. Outflow into the right leg was very compromised with a small deep femoral artery runoff only. On the left deep femoral arose from the common femoral artery and was a much better artery. While in PACU patient was found to loss of signal in her left leg. She still had motor and sensory function but this was definitely a change. She continues to have easily palpable graft pulse in the groin. She was taken immediately back to the operating room for re-intervention. The left groin incision was reopened. The external iliac artery was controlled under the inguinal ligament the graft itself was controlled and the outflow branches were controlled. The superficial femoral artery was chronically occluded. The patient was given 5000 units intravenous heparin and after adequate circulation time the left limb of the graft to the iliac arteries were occluded. The common femoral artery was opened further distal I just above the bifurcation of the superficial femoral and profundus reverse arteries. There was thrombus present. The arteriotomy was continued onto the head of the graft.  The deep femoral artery was thrombectomized with a 4 Fogarty catheter the catheter passed to mid thigh. There was thrombus removed but very poor backbleeding from the artery. For this reason decision was made to proceed with femoral-popliteal bypass which had been discussed with the patient and husband. The saphenous vein was of good caliber at the saphenofemoral junction. A small incision was made in the thigh for harvesting and then incision was made on the medial aspect the above-knee position to harvest the saphenous vein as well. May the vein became much smaller caliber as it got to the level of the popliteal space. Curvature branches were ligated with 3 or 4 silk ties and divided. The saphenous vein was ligated distally and divided. The vein was ligated at the saphenofemoral junction and divided. The vein was cannulated and was gently dilated with heparinized saline and was marked to prevent twisting and the tunnel. The proximal portion of the vein was spatulated and was sewn from the junction of the deep femoral artery up onto the hood of the Dacron graft. This anastomosis was tested and found to be adequate. Next [employer] was used to divide the vein valves and good flow was noted through the vein. The vein was brought through prior created tunnel down to the level of the above-knee popliteal artery. The above-knee pop total artery was occluded proximal and distally was opened with 11 blade some ulcerative Potts scissors. A 2-1/2 dilator could pass through the artery which is relatively small but had minimal atherosclerotic change. The vein was cut to appropriate length was spatulated and sewn end-to-side to the artery with a running 6-0 Prolene suture. Prior to completion of the closure the  dilator again passed through the anastomosis. Anastomosis completed and clamps removed restoring good flow to the foot with good Doppler flow. The patient's heparin was not reversed. Wounds irrigated with saline.  The groin was also closed with 2-0 Vicryl in several layers and subcutaneous tissue. Skin was closed with 3 or septic or Vicryl suture. The popliteal incision was closed with several layers of 2-0 Vicryl the fascia and skin was closed with 3-0 subcuticular Vicryl suture. The vein harvest site in mid thigh was closed with 3-0 Vicryl the subcutaneous subcuticular tissue. Patient was transferred to the recovery room in stable condition with audible dorsalis pedis pulses bilaterally   Rosetta Posner, M.D., [facility] 08/14/2017 7:18 PM

## 2017-08-15 ENCOUNTER — Ambulatory Visit: Payer: BLUE CROSS/BLUE SHIELD | Admitting: Cardiovascular Disease

## 2017-08-15 ENCOUNTER — Encounter (HOSPITAL_COMMUNITY): Payer: Self-pay | Admitting: Vascular Surgery

## 2017-08-15 ENCOUNTER — Inpatient Hospital Stay (HOSPITAL_COMMUNITY): Payer: BLUE CROSS/BLUE SHIELD

## 2017-08-15 LAB — POCT I-STAT 7, (LYTES, BLD GAS, ICA,H+H)
ACID-BASE DEFICIT: 2 mmol/L (ref 0.0–2.0)
Bicarbonate: 22.5 mmol/L (ref 20.0–28.0)
Calcium, Ion: 1.03 mmol/L — ABNORMAL LOW (ref 1.15–1.40)
HEMATOCRIT: 23 % — AB (ref 36.0–46.0)
HEMOGLOBIN: 7.8 g/dL — AB (ref 12.0–15.0)
O2 SAT: 100 %
PH ART: 7.439 (ref 7.350–7.450)
POTASSIUM: 2.9 mmol/L — AB (ref 3.5–5.1)
Patient temperature: 36
Sodium: 145 mmol/L (ref 135–145)
TCO2: 24 mmol/L (ref 22–32)
pCO2 arterial: 32.8 mmHg (ref 32.0–48.0)
pO2, Arterial: 409 mmHg — ABNORMAL HIGH (ref 83.0–108.0)

## 2017-08-15 LAB — POCT I-STAT EG7
ACID-BASE DEFICIT: 6 mmol/L — AB (ref 0.0–2.0)
BICARBONATE: 20.8 mmol/L (ref 20.0–28.0)
Calcium, Ion: 1.12 mmol/L — ABNORMAL LOW (ref 1.15–1.40)
HCT: 30 % — ABNORMAL LOW (ref 36.0–46.0)
HEMOGLOBIN: 10.2 g/dL — AB (ref 12.0–15.0)
O2 Saturation: 82 %
PCO2 VEN: 43.9 mmHg — AB (ref 44.0–60.0)
PH VEN: 7.28 (ref 7.250–7.430)
PO2 VEN: 50 mmHg — AB (ref 32.0–45.0)
Potassium: 3.7 mmol/L (ref 3.5–5.1)
Sodium: 144 mmol/L (ref 135–145)
TCO2: 22 mmol/L (ref 22–32)

## 2017-08-15 LAB — COMPREHENSIVE METABOLIC PANEL
ALBUMIN: 2 g/dL — AB (ref 3.5–5.0)
ALBUMIN: 2.1 g/dL — AB (ref 3.5–5.0)
ALT: 9 U/L — ABNORMAL LOW (ref 14–54)
ALT: 11 U/L — AB (ref 14–54)
ANION GAP: 3 — AB (ref 5–15)
AST: 25 U/L (ref 15–41)
AST: 25 U/L (ref 15–41)
Alkaline Phosphatase: 60 U/L (ref 38–126)
Alkaline Phosphatase: 65 U/L (ref 38–126)
Anion gap: 4 — ABNORMAL LOW (ref 5–15)
BUN: 11 mg/dL (ref 6–20)
BUN: 14 mg/dL (ref 6–20)
CALCIUM: 6.5 mg/dL — AB (ref 8.9–10.3)
CHLORIDE: 113 mmol/L — AB (ref 101–111)
CO2: 23 mmol/L (ref 22–32)
CO2: 22 mmol/L (ref 22–32)
CREATININE: 0.59 mg/dL (ref 0.44–1.00)
CREATININE: 0.58 mg/dL (ref 0.44–1.00)
Calcium: 7.4 mg/dL — ABNORMAL LOW (ref 8.9–10.3)
Chloride: 114 mmol/L — ABNORMAL HIGH (ref 101–111)
GFR calc Af Amer: >60 mL/min (ref >60)
GFR calc non Af Amer: >60 mL/min (ref >60)
GFR calc non Af Amer: >60 mL/min (ref >60)
GLUCOSE: 143 mg/dL — AB (ref 65–99)
Glucose, Bld: 154 mg/dL — ABNORMAL HIGH (ref 65–99)
POTASSIUM: 2.9 mmol/L — AB (ref 3.5–5.1)
POTASSIUM: 3.6 mmol/L (ref 3.5–5.1)
SODIUM: 140 mmol/L (ref 135–145)
SODIUM: 139 mmol/L (ref 135–145)
TOTAL PROTEIN: 3.6 g/dL — AB (ref 6.5–8.1)
Total Bilirubin: 0.5 mg/dL (ref 0.3–1.2)
Total Bilirubin: 0.4 mg/dL (ref 0.3–1.2)
Total Protein: 4.2 g/dL — ABNORMAL LOW (ref 6.5–8.1)

## 2017-08-15 LAB — GLUCOSE, CAPILLARY
GLUCOSE-CAPILLARY: 127 mg/dL — AB (ref 65–99)
Glucose-Capillary: 134 mg/dL — ABNORMAL HIGH (ref 65–99)

## 2017-08-15 LAB — CBC
HCT: 33.7 % — ABNORMAL LOW (ref 36.0–46.0)
HCT: 33.2 % — ABNORMAL LOW (ref 36.0–46.0)
HEMOGLOBIN: 11.2 g/dL — AB (ref 12.0–15.0)
Hemoglobin: 11.1 g/dL — ABNORMAL LOW (ref 12.0–15.0)
MCH: 28.9 pg (ref 26.0–34.0)
MCH: 29.1 pg (ref 26.0–34.0)
MCHC: 33.2 g/dL (ref 30.0–36.0)
MCHC: 33.4 g/dL (ref 30.0–36.0)
MCV: 86.9 fL (ref 78.0–100.0)
MCV: 86.9 fL (ref 78.0–100.0)
PLATELETS: 128 10*3/uL — AB (ref 150–400)
Platelets: 131 10*3/uL — ABNORMAL LOW (ref 150–400)
RBC: 3.88 MIL/uL (ref 3.87–5.11)
RBC: 3.82 MIL/uL — AB (ref 3.87–5.11)
RDW: 15.3 % (ref 11.5–15.5)
RDW: 16.2 % — AB (ref 11.5–15.5)
WBC: 12.3 10*3/uL — ABNORMAL HIGH (ref 4.0–10.5)
WBC: 12.6 10*3/uL — ABNORMAL HIGH (ref 4.0–10.5)

## 2017-08-15 LAB — AMYLASE
Amylase: 71 U/L (ref 28–100)
Amylase: 64 U/L (ref 28–100)

## 2017-08-15 LAB — MAGNESIUM
MAGNESIUM: 1.5 mg/dL — AB (ref 1.7–2.4)
Magnesium: 2.6 mg/dL — ABNORMAL HIGH (ref 1.7–2.4)

## 2017-08-15 MED ORDER — POTASSIUM CHLORIDE 10 MEQ/50ML IV SOLN
10.0000 meq | INTRAVENOUS | Status: AC
  Administered 2017-08-15 (×3): 10 meq via INTRAVENOUS
  Filled 2017-08-15 (×3): qty 50

## 2017-08-15 MED ORDER — SUGAMMADEX SODIUM 200 MG/2ML IV SOLN
INTRAVENOUS | Status: AC
  Filled 2017-08-15: qty 2

## 2017-08-15 MED ORDER — HYDROMORPHONE HCL 1 MG/ML IJ SOLN
0.2500 mg | INTRAMUSCULAR | Status: DC | PRN
  Administered 2017-08-15 (×6): 0.5 mg via INTRAVENOUS
  Filled 2017-08-15 (×7): qty 0.5

## 2017-08-15 MED ORDER — MEPERIDINE HCL 25 MG/ML IJ SOLN: 6.2500 mg | INTRAMUSCULAR | Status: DC | PRN

## 2017-08-15 MED ORDER — PROMETHAZINE HCL 25 MG/ML IJ SOLN
6.2500 mg | INTRAMUSCULAR | Status: AC | PRN
  Administered 2017-08-15 (×2): 12.5 mg via INTRAVENOUS
  Filled 2017-08-15 (×2): qty 1

## 2017-08-15 MED ORDER — WHITE PETROLATUM GEL
Status: AC
  Administered 2017-08-15: 09:00:00
  Filled 2017-08-15: qty 1

## 2017-08-15 MED ORDER — SODIUM CHLORIDE 0.9 % IV SOLN
1.0000 g | Freq: Once | INTRAVENOUS | Status: AC
  Administered 2017-08-15: 1 g via INTRAVENOUS
  Filled 2017-08-15: qty 10

## 2017-08-15 MED ORDER — SODIUM CHLORIDE 0.9 % IV SOLN: INTRAVENOUS | Status: DC

## 2017-08-15 NOTE — Evaluation (Signed)
Physical Therapy Evaluation Patient Details Name: Erin [name] MRN: [id number] DOB: 1956-05-11 Today's Date: 08/15/2017   History of Present Illness  61 y.o. female who was referred by Dr. Paulla Dolly for evaluation of peripheral arterial disease.The patient reports severe bilateral leg pain affecting both thighs and calves which started about 4 years ago and has been worsening. She takes lots of ibuprofen every day for the pain. The pain happens after minimal walking distance. She also has numbness at rest.s/p Aortobifemoral bypass with a 14 by 8 Hemashield graft.. End 2 side of the common femoral and superficial femoral artery on the right. Common femoral artery on the left. Also had reimplantation of the inferior mesenteric artery. The left iliac graft limb.08/14/17 s/p surgery severe ischemia left leg post aortobifemoral bypass, s/p Thrombectomy of left profundus femoris artery and left femoral to above-knee popliteal bypass with translocated non-reversed great saphenous vein 08/14/17 s/p Right leg ischemia following aortobifemoral bypass 08/14/17  Right femoral to above-knee popliteal bypass with translocated non-reversed great saphenous vein  Clinical Impression  Patient is s/p above surgery resulting in functional limitations due to the deficits listed below (see PT Problem List). Pt currently total Ax2 for bed mobility. Pt able to sit EoB for 5 minutes before c/o dizziness and nausea and BP drop. Pt returned to supine and BP rebounded. Patient will benefit from skilled PT to increase their independence and safety with mobility to allow discharge to the venue listed below.       Follow Up Recommendations SNF    Equipment Recommendations  Other (comment) (to be determined at next venue)       Precautions / Restrictions Precautions Precautions: None Restrictions Weight Bearing Restrictions: No      Mobility  Bed Mobility Overal bed mobility: Needs Assistance Bed Mobility: Rolling;Supine  to Sit;Sit to Supine Rolling: Total assist;+2 for physical assistance   Supine to sit: Total assist;+2 for physical assistance Sit to supine: Total assist;+2 for physical assistance   General bed mobility comments: totalAx2 secondary to patient pain with movement, and increased lethargy                       Balance Overall balance assessment: Needs assistance Sitting-balance support: Bilateral upper extremity supported;Feet supported Sitting balance-Leahy Scale: Zero   Postural control: Posterior lean                                   Pertinent Vitals/Pain Pain Assessment: Faces Faces Pain Scale: Hurts worst Pain Location: incisional sites, abdomen, groin, bilateral legs Pain Descriptors / Indicators: Grimacing;Guarding Pain Intervention(s): Patient requesting pain meds-RN notified;RN gave pain meds during session;Monitored during session;Limited activity within patient's tolerance;Repositioned    Home Living Family/patient expects to be discharged to:: Private residence Living Arrangements: Spouse/significant other Available Help at Discharge: Family;Available 24 hours/day Type of Home: House Home Access: Stairs to enter   [employer] of Steps: 1 Home Layout: One level Home Equipment: Walker - 2 wheels;Cane - single point;Bedside commode;Shower seat - built in;Grab bars - toilet;Grab bars - tub/shower;Hand held shower head      Prior Function Level of Independence: Independent               Hand Dominance        Extremity/Trunk Assessment   Upper Extremity Assessment Upper Extremity Assessment: Defer to OT evaluation    Lower Extremity Assessment Lower Extremity Assessment: RLE deficits/detail;LLE deficits/detail  RLE Deficits / Details: R fem-pop bypass,  RLE: Unable to fully assess due to pain LLE Deficits / Details: ROM limited by pain, strength grossly 2+/5       Communication   Communication: No difficulties   Cognition Arousal/Alertness: Lethargic;Suspect due to medications Behavior During Therapy: Flat affect Overall Cognitive Status: Within Functional Limits for tasks assessed                                        General Comments General comments (skin integrity, edema, etc.): in supine BP 85/65, HR 122 bpm, SaO2 on RA 92%O2, seated EoB BP 78/62, HR 133 bpm, pt with complaints of dizziness and nausea, so pt laid back down, back in supine BP rebounded to 113/73, HR 110bpm    Exercises     Assessment/Plan    PT Assessment Patient needs continued PT services  PT Problem List Decreased strength;Decreased range of motion;Decreased activity tolerance;Decreased balance;Decreased mobility;Decreased knowledge of precautions;Pain;Cardiopulmonary status limiting activity       PT Treatment Interventions DME instruction;Gait training;Functional mobility training;Therapeutic activities;Therapeutic exercise;Balance training;Patient/family education    PT Goals (Current goals can be found in the Care Plan section)  Acute Rehab PT Goals Patient Stated Goal: none stated PT Goal Formulation: With patient/family Time For Goal Achievement: 08/29/17 Potential to Achieve Goals: Fair    Frequency Min 3X/week   Barriers to discharge Inaccessible home environment      Co-evaluation PT/OT/SLP Co-Evaluation/Treatment: Yes Reason for Co-Treatment: For patient/therapist safety;Complexity of the patient's impairments (multi-system involvement) PT goals addressed during session: Mobility/safety with mobility;Balance         AM-PAC PT "6 Clicks" Daily Activity  Outcome Measure Difficulty turning over in bed (including adjusting bedclothes, sheets and blankets)?: Unable Difficulty moving from lying on back to sitting on the side of the bed? : Unable Difficulty sitting down on and standing up from a chair with arms (e.g., wheelchair, bedside commode, etc,.)?: Unable Help needed moving  to and from a bed to chair (including a wheelchair)?: Total Help needed walking in hospital room?: Total Help needed climbing 3-5 steps with a railing? : Total 6 Click Score: 6    End of Session Equipment Utilized During Treatment: Gait belt Activity Tolerance: Patient limited by lethargy;Patient limited by pain Patient left: in bed;with call bell/phone within reach;with family/visitor present Nurse Communication: Mobility status PT Visit Diagnosis: Other abnormalities of gait and mobility (R26.89);Muscle weakness (generalized) (M62.81);Difficulty in walking, not elsewhere classified (R26.2);Pain Pain - Right/Left:  (bilateral) Pain - part of body: Leg (abdomen and groin)    Time: 2426-8341 PT Time Calculation (min) (ACUTE ONLY): 29 min   Charges:   PT Evaluation $PT Eval Moderate Complexity: 1 Mod     PT G Codes:        Meryn Sarracino B. Migdalia Dk PT, DPT Acute Rehabilitation  [phone] Pager [phone]    Violet 08/15/2017, 3:43 PM

## 2017-08-15 NOTE — Op Note (Signed)
OPERATIVE REPORT  DATE OF SURGERY: 08/15/2017  PATIENT: Erin [name], 61 y.o. female MRN: [id number]  DOB: Aug 28, 1956  PRE-OPERATIVE DIAGNOSIS: Right leg ischemia following aortobifemoral bypass  POST-OPERATIVE DIAGNOSIS:  Same  PROCEDURE: Right femoral to above-knee popliteal bypass with translocated non-reversed great saphenous vein  SURGEON:  Curt Jews, M.D.  PHYSICIAN ASSISTANT: Gerri Lins PA-C  ANESTHESIA:  Gen.  EBL: 75 ml  Total I/O In: 1680 [I.V.:1050; Blood:630] Out: 355 [Urine:280; Blood:75]  BLOOD ADMINISTERED: 2 units packed red blood cells  DRAINS: None  SPECIMEN: None  COUNTS CORRECT:  YES  PLAN OF CARE: PACU stable   PATIENT DISPOSITION:  PACU - hemodynamically stable  PROCEDURE DETAILS: Patient very complex operative day. She initially underwent aortobifemoral bypass with Dacron graft. 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 Outflow is through the profundus femoris arteries bilaterally. She lost signal in her left leg and was returned to the operating room for left femoral-popliteal bypasses. She stabilizes returned to the intensive care unit. After being in intensive care unit for approximately 2 hours she had the progressive ischemia in her right leg with an audible flow. Taken back to the operating room for right femoral-popliteal bypass. She did have extremely small deep femoral artery is his only runoff. Her femoral pulse with the femoral bypass remains state patent.  The patient was placed supine position the area of the right groin right leg prepped in sterile fashion. The right groin incision was reopened. The saphenous vein was exposed through this area. SonoSite ultrasound revealed the vein was patent although became smaller in the distal thigh. The vein was harvested from the groin to the knee leaving several skin bridges. Curvature branches were ligated with 3 or 4 silk ties and divided. The vein was  ligated at the level of the knee and divided. The vein was occluded at the saphenofemoral junction with a Cooley clamp and the vein was divided from the saphenofemoral junction. Saphenofemoral junction was oversewn with 5-0 Prolene suture. The vein was gently dilated. Was excellent size in the proximal thigh and became smaller in the mid to distal thigh. It was felt to be adequate for bypass. The above-knee popliteal artery was exposed through the same vein harvest incision at the medial approach. The trachea Gore tunneler was used to create a tunnel from the popliteal artery to the groin. The patient was given 5000 units intravenous heparin and after adequate circulation time the Dacron graft limb of the aorta right femoral bypass was occluded as was the external iliac artery at the level of inguinal ligament. Deep femoral arteries were occluded with Serafin clamp. A ellipse of graft was removed near the toe of the femoral anastomosis and the vein was brought onto the field. The vein was spatulated and non-reversed and was sewn end-to-side to the hood of the Dacron graft with a running 6-0 Prolene suture. This anastomosis tested and found to be adequate. Next [employer] was used to lyse the vein valves giving excellent flow through the vein graft. The vein was then brought to the prior created tunnel. The above-knee popliteal artery was occluded proximally and distally was opened with 11 blade and extended longitudinally with Potts scissors. The artery was relatively small but they had minimal atherosclerotic change. The graft was sewn end-to-side to the above-knee popliteal artery with a running 6-0 Prolene suture. 2 half dilator passed easily through the artery and vein anastomosis prior to completion. The anastomosis completed and clamps removed giving  excellent flow to the foot. Graft dependent Doppler flow is noted in the dorsalis pedis and posterior tibial arteries. The patient was given protamine to  reverse heparin. The groin and other wounds were irrigated with saline. The groin wound was closed with 2-0 Vicryl in several layers over the graft. Skin was closed with 3-0 subcuticular Vicryl suture. The popliteal incision was closed with 2-0 Vicryl in the fascia and skin was closed with 3-0 subcuticular Vicryl stitch. The vein harvest incision the mid thigh was closed with a 3-0 Vicryl in the subcutaneous and subcuticular tissue. Sterile dressing was applied and the patient was taken to the recovery room stable condition    Rosetta Posner, M.D., [facility] 08/15/2017 12:12 AM

## 2017-08-15 NOTE — Care Management Note (Addendum)
Case Management Note  Patient Details  Name: POLLY BARNER MRN: [id number] Date of Birth: 1956-09-28  Subjective/Objective:  From home with spouse, POD 1 right fem pop bypass .  Has doppler pulses in both feet, replete K, NGT with minimal output, may start lovenox tomorrow, cont npo - no BS or flatus.   9/27 [place], BSN - per pt eval rec SNF, CSW aware.                  Action/Plan: NCM will follow for dc needs.   Expected Discharge Date:                  Expected Discharge Plan:  Home/Self Care  In-House Referral:     Discharge planning Services  CM Consult  Post Acute Care Choice:    Choice offered to:     DME Arranged:    DME Agency:     HH Arranged:    HH Agency:     Status of Service:  In process, will continue to follow  If discussed at Long Length of Stay Meetings, dates discussed:    Additional Comments:  Zenon Mayo, RN 08/15/2017, 1:32 PM

## 2017-08-15 NOTE — Progress Notes (Signed)
CRITICAL VALUE ALERT  Critical Value: Calcium 6.3, Potassium 2.9  Date & Time Notied:  9/25 0523  Provider Notified: Curt Jews MD Orders Received/Actions taken: 1g Calcium Gluconate, 3 X 10 meq Potassium/central line

## 2017-08-15 NOTE — Evaluation (Signed)
Occupational Therapy Evaluation Patient Details Name: Erin [name] MRN: [id number] DOB: 1956/04/04 Today's Date: 08/15/2017    History of Present Illness 61 y.o. female who was referred by Dr. Paulla Dolly for evaluation of peripheral arterial disease.The patient reports severe bilateral leg pain affecting both thighs and calves which started about 4 years ago and has been worsening. She takes lots of ibuprofen every day for the pain. The pain happens after minimal walking distance. She also has numbness at rest.s/p Aortobifemoral bypass with a 14 by 8 Hemashield graft.. End 2 side of the common femoral and superficial femoral artery on the right. Common femoral artery on the left. Also had reimplantation of the inferior mesenteric artery. The left iliac graft limb.08/14/17 s/p surgery severe ischemia left leg post aortobifemoral bypass, s/p Thrombectomy of left profundus femoris artery and left femoral to above-knee popliteal bypass with translocated non-reversed great saphenous vein 08/14/17 s/p Right leg ischemia following aortobifemoral bypass 08/14/17  Right femoral to above-knee popliteal bypass with translocated non-reversed great saphenous vein   Clinical Impression   PTA, pt's husband reports that she was independent with ADL and functional mobility. She currently requires total assistance for all aspects of ADL participation. She presents with increased lethargy this session as well as significant abdominal and B LE pain post-operatively limiting her ability to participate in ADL at [facility]. She was able to sit at EOB with total assistance +2 for mobility. While seated, pt with increased dizziness, nausea, and BP drop (see general comments below). Assisted pt to return to supine. Additionally, noted incisional "weeping" and RN present and addressing. Pt would benefit from continued OT services while admitted to improve independence with ADL and functional mobility. Recommend SNF level rehabilitation  post-acute D/C to maximize return to independence prior to D/C home with family.    Follow Up Recommendations  SNF    Equipment Recommendations  Other (comment) (TBD at next venue of care)    Recommendations for Other Services       Precautions / Restrictions Precautions Precautions: None Restrictions Weight Bearing Restrictions: No      Mobility Bed Mobility Overal bed mobility: Needs Assistance Bed Mobility: Rolling;Supine to Sit;Sit to Supine Rolling: Total assist;+2 for physical assistance   Supine to sit: Total assist;+2 for physical assistance Sit to supine: Total assist;+2 for physical assistance   General bed mobility comments: totalAx2 secondary to patient pain with movement, and increased lethargy  Transfers                 General transfer comment: unsafe to attempt transfer due to low BP    Balance Overall balance assessment: Needs assistance Sitting-balance support: Bilateral upper extremity supported;Feet supported Sitting balance-Leahy Scale: Zero   Postural control: Posterior lean                                 ADL either performed or assessed with clinical judgement   ADL Overall ADL's : Needs assistance/impaired                                       General ADL Comments: Pt requiring total assist for ADL participation secondary to lethargy and pain at this time.      Vision   Additional Comments: Pt closing her eyes frequently during session and highly lethargic.      Perception  Praxis      Pertinent Vitals/Pain Pain Assessment: Faces Faces Pain Scale: Hurts worst Pain Location: incisional sites, abdomen, groin, bilateral legs Pain Descriptors / Indicators: Grimacing;Guarding Pain Intervention(s): Patient requesting pain meds-RN notified;RN gave pain meds during session;Monitored during session;Limited activity within patient's tolerance;Repositioned     Hand Dominance     Extremity/Trunk  Assessment Upper Extremity Assessment Upper Extremity Assessment: Generalized weakness   Lower Extremity Assessment Lower Extremity Assessment: Defer to PT evaluation RLE Deficits / Details: R fem-pop bypass,  RLE: Unable to fully assess due to pain LLE Deficits / Details: ROM limited by pain, strength grossly 2+/5       Communication Communication Communication: No difficulties   Cognition Arousal/Alertness: Lethargic;Suspect due to medications Behavior During Therapy: Flat affect Overall Cognitive Status: Within Functional Limits for tasks assessed                                     General Comments  In supine BP 85/65, HR 122 bpm, SaO2 on RA 92%. Seated at EOB BP 78/62, HR 133 bpm and pt with complaints of dizziness/nausea. Assisted pt to supine and BP increased to 113/72 with HR 110 bpm.    Exercises     Shoulder Instructions      Home Living Family/patient expects to be discharged to:: Private residence Living Arrangements: Spouse/significant other Available Help at Discharge: Family;Available 24 hours/day Type of Home: House Home Access: Stairs to enter [employer] of Steps: 1   Home Layout: One level     Bathroom Shower/Tub: Occupational psychologist: Handicapped height Bathroom Accessibility: Yes   Home Equipment: Environmental consultant - 2 wheels;Cane - single point;Bedside commode;Shower seat - built in;Grab bars - toilet;Grab bars - tub/shower;Hand held shower head          Prior Functioning/Environment Level of Independence: Independent                 OT Problem List: Decreased range of motion;Impaired balance (sitting and/or standing);Decreased safety awareness;Decreased knowledge of use of DME or AE;Pain;Impaired UE functional use;Decreased activity tolerance;Cardiopulmonary status limiting activity      OT Treatment/Interventions: Self-care/ADL training;Therapeutic exercise;Energy conservation;DME and/or AE  instruction;Therapeutic activities;Patient/family education;Balance training    OT Goals(Current goals can be found in the care plan section) Acute Rehab OT Goals Patient Stated Goal: none stated OT Goal Formulation: With patient Time For Goal Achievement: 08/29/17 Potential to Achieve Goals: Good ADL Goals Pt Will Perform Grooming: with min assist;sitting Pt Will Transfer to Toilet: with mod assist;stand pivot transfer;bedside commode Additional ADL Goal #1: Pt will complete bed mobility in preparation for ADL seated at EOB with overall mod assist.  Additional ADL Goal #2: Pt will tolerate sitting at EOB for 10 minutes during grooming tasks with min assist for balance and stable vital signs.   OT Frequency: Min 2X/week   Barriers to D/C:            Co-evaluation PT/OT/SLP Co-Evaluation/Treatment: Yes Reason for Co-Treatment: For patient/therapist safety;Complexity of the patient's impairments (multi-system involvement) PT goals addressed during session: Mobility/safety with mobility;Balance OT goals addressed during session: ADL's and self-care      AM-PAC PT "6 Clicks" Daily Activity     Outcome Measure Help from another person eating meals?: Total Help from another person taking care of personal grooming?: Total Help from another person toileting, which includes using toliet, bedpan, or urinal?: Total Help from another  person bathing (including washing, rinsing, drying)?: Total Help from another person to put on and taking off regular upper body clothing?: Total Help from another person to put on and taking off regular lower body clothing?: Total 6 Click Score: 6   End of Session Nurse Communication: Mobility status  Activity Tolerance: Treatment limited secondary to medical complications (Comment) (low BP) Patient left: in bed;with call bell/phone within reach;with family/visitor present  OT Visit Diagnosis: Muscle weakness (generalized) (M62.81);Other abnormalities of  gait and mobility (R26.89);Pain Pain - Right/Left:  (bilateral; abdomen) Pain - part of body: Leg (B leg, abdomen)                Time: 5072-2575 OT Time Calculation (min): 33 min Charges:  OT General Charges $OT Visit: 1 Visit OT Evaluation $OT Eval Moderate Complexity: 1 Mod G-Codes:     Norman Herrlich, MS OTR/L  Pager: [place] A Flossie Wexler 08/15/2017, 4:49 PM

## 2017-08-15 NOTE — Transfer of Care (Signed)
Immediate Anesthesia Transfer of Care Note  Patient: Erin [name]  Procedure(s) Performed: Procedure(s): Right Femoral to Above Knee Popliteal Bypass Graft using Non-Reversed Greater Saphenous Vein Graft from Right Leg (Right)  Patient Location: SICU  Anesthesia Type:General  Level of Consciousness: awake, sedated and patient cooperative  Airway & Oxygen Therapy: Patient connected to face mask oxygen  Post-op Assessment: Report given to RN and Post -op Vital signs reviewed and stable  Post vital signs: Reviewed and stable  Last Vitals:  Vitals:   08/14/17 2130 08/14/17 2145  BP:    Pulse: 72 87  Resp: (!) 9 15  Temp:    SpO2: 100% 100%    Last Pain:  Vitals:   08/14/17 1922  TempSrc:   PainSc: 10-Worst pain ever      Patients Stated Pain Goal: 2 (35/45/62 5638)  Complications: No apparent anesthesia complications

## 2017-08-15 NOTE — Progress Notes (Addendum)
Progress Note    08/15/2017 7:16 AM 1 Day Post-Op  Subjective:  "I'm cold" c/o her belly hurts  Tm 99.1 now afebrile 00'X-381'W systolic HR 29'H-37'J NSR 100% 6L simple mask   Vitals:   08/15/17 0600 08/15/17 0700  BP: 97/64   Pulse: 87 88  Resp: 11 12  Temp: 99 F (37.2 C) 99 F (37.2 C)  SpO2: 100% 100%    Physical Exam: Cardiac:  regular Lungs:  Non labored Incisions:  Laparotomy incision and bilateral groins and leg incisions look good. Extremities:  +DP/PT doppler signals bilaterally with faint peroneal doppler signals bilaterally.  Bilateral feet are warm; left calf is tender to touch, but all compartments are soft.  Right calf non tender and compartments are soft.   Abdomen:  Soft; NT/ND; -BS  CBC    Component Value Date/Time   WBC 12.3 (H) 08/15/2017 0405   RBC 3.88 08/15/2017 0405   HGB 11.2 (L) 08/15/2017 0405   HGB 11.4 07/25/2017 1155   HGB 10.6 (L) 11/28/2013 1148   HCT 33.7 (L) 08/15/2017 0405   HCT 35.0 07/25/2017 1155   HCT 31.2 (L) 11/28/2013 1148   PLT 128 (L) 08/15/2017 0405   PLT 365 07/25/2017 1155   MCV 86.9 08/15/2017 0405   MCV 94 07/25/2017 1155   MCV 101.4 (H) 11/28/2013 1148   MCH 28.9 08/15/2017 0405   MCHC 33.2 08/15/2017 0405   RDW 15.3 08/15/2017 0405   RDW 13.8 07/25/2017 1155   RDW 15.4 (H) 11/28/2013 1148   LYMPHSABS 1.4 07/25/2017 1155   LYMPHSABS 0.8 (L) 11/28/2013 1148   MONOABS 0.4 06/27/2016 1041   MONOABS 0.9 11/28/2013 1148   EOSABS 0.1 07/25/2017 1155   BASOSABS 0.0 07/25/2017 1155   BASOSABS 0.0 11/28/2013 1148    BMET    Component Value Date/Time   NA 140 08/15/2017 0405   NA 139 07/25/2017 1155   NA 139 04/22/2014 1028   K 2.9 (L) 08/15/2017 0405   K 4.1 04/22/2014 1028   CL 114 (H) 08/15/2017 0405   CO2 23 08/15/2017 0405   CO2 23 04/22/2014 1028   GLUCOSE 154 (H) 08/15/2017 0405   GLUCOSE 81 04/22/2014 1028   BUN 11 08/15/2017 0405   BUN 8 07/25/2017 1155   BUN 8.8 04/22/2014 1028   CREATININE 0.59 08/15/2017 0405   CREATININE 0.8 04/22/2014 1028   CALCIUM 6.5 (L) 08/15/2017 0405   CALCIUM 9.1 04/22/2014 1028   GFRNONAA >60 08/15/2017 0405   GFRAA >60 08/15/2017 0405    INR    Component Value Date/Time   INR 2.87 08/14/2017 1400     Intake/Output Summary (Last 24 hours) at 08/15/17 0716 Last data filed at 08/15/17 0600  Gross per 24 hour  Intake          6841.67 ml  Output             1875 ml  Net          4966.67 ml   NGT output  Assessment:  61 y.o. Erin [name] is s/p:  Aortobifemoral bypass with a Erin by 8 Hemashield graft.. End 2 side of the common femoral and superficial femoral artery on the right. Common femoral artery on the left. Also had reimplantation of the inferior mesenteric artery. The left iliac graft limb  1 Day Post-Op And Thrombectomy of left profundus femoris artery and left femoral to above-knee popliteal bypass with translocated non-reversed great saphenous vein And Right femoral to above-knee popliteal bypass with  translocated non-reversed great saphenous vein   Plan: -pt with +doppler signals in both feet -left calf is tender to palpation, however all compartments are soft bilaterally.  Discuss with Dr. Donnetta Hutching and she was tender when she returned to the OR for the right leg.  Will give 2g Mag. -hypokalemia-receiving K+ supplement now -NGT with minimal output at 50cc-most likely will dc-Dr. Laasia Arcos to determine -acute surgical blood loss anemia-received total of 4 units of PRBC's -DVT prophylaxis:  SCD's for now-if hgb okay tomorrow-will start Lovenox at that time. -continue npo for now as she has no BS or flatus   [name], PA-C [facility] [phone] 08/15/2017 7:16 AM  I have examined the patient, reviewed and agree with above.In complaint is abdominal soreness as expected. Minimal output from NG tube so therefore will discontinue. She does have some tenderness in her left calf and no tenderness in her  anterior compartments. Her ischemia time was brief yesterday. I did discuss this with the patient we will continue to keep an eye on this. The very unlikely for her to develop a compartment syndrome. Flow to her lower extremities with palpable popliteal pulses. Begin to mobilize today. Stable over  Curt Jews, MD 08/15/2017 8:44 AM

## 2017-08-16 LAB — BASIC METABOLIC PANEL
ANION GAP: 5 (ref 5–15)
BUN: 17 mg/dL (ref 6–20)
CALCIUM: 7.4 mg/dL — AB (ref 8.9–10.3)
CHLORIDE: 111 mmol/L (ref 101–111)
CO2: 24 mmol/L (ref 22–32)
Creatinine, Ser: 0.66 mg/dL (ref 0.44–1.00)
GFR calc non Af Amer: >60 mL/min (ref >60)
Glucose, Bld: 127 mg/dL — ABNORMAL HIGH (ref 65–99)
Potassium: 3.5 mmol/L (ref 3.5–5.1)
Sodium: 140 mmol/L (ref 135–145)

## 2017-08-16 LAB — CBC
HEMATOCRIT: 27.5 % — AB (ref 36.0–46.0)
HEMOGLOBIN: 9 g/dL — AB (ref 12.0–15.0)
MCH: 28.8 pg (ref 26.0–34.0)
MCHC: 32.7 g/dL (ref 30.0–36.0)
MCV: 88.1 fL (ref 78.0–100.0)
Platelets: 123 10*3/uL — ABNORMAL LOW (ref 150–400)
RBC: 3.12 MIL/uL — ABNORMAL LOW (ref 3.87–5.11)
RDW: 15.9 % — ABNORMAL HIGH (ref 11.5–15.5)
WBC: 13.2 10*3/uL — AB (ref 4.0–10.5)

## 2017-08-16 MED ORDER — HYDROMORPHONE HCL 1 MG/ML IJ SOLN
0.5000 mg | INTRAMUSCULAR | Status: DC | PRN
  Administered 2017-08-16 – 2017-08-20 (×23): 0.5 mg via INTRAVENOUS
  Filled 2017-08-16 (×23): qty 0.5

## 2017-08-16 MED ORDER — DIPHENHYDRAMINE HCL 25 MG PO CAPS
25.0000 mg | ORAL_CAPSULE | Freq: Four times a day (QID) | ORAL | Status: DC | PRN
  Administered 2017-08-16 – 2017-08-18 (×7): 25 mg via ORAL
  Filled 2017-08-16 (×7): qty 1

## 2017-08-16 NOTE — Progress Notes (Addendum)
AAA Progress Note    08/16/2017 7:21 AM 2 Days Post-Op  Subjective:  Resting comfortably when I walk in-she awakes easily.  Says she wants to die and this is the most pain she has ever had.  RN tells me that she will not move or let her turn her and refuses to get out of bed.   Tm 100.2 now 100 HR 100's-110's ST 01'U-932'T systolic 55% RA  Vitals:   08/16/17 0400 08/16/17 0500  BP: 103/63 103/61  Pulse: (!) 110 (!) 112  Resp: 15 16  Temp: 100 F (37.8 C)   SpO2: 95% 95%    Physical Exam: Cardiac:  Regular/tachy Lungs:  Non labored Abdomen:  Soft, NT/ND; occasional BS; -flatus Incisions:  Midline and groin incisions are healing nicely Extremities:  +DP/peroneal doppler signals; bilateral feet are warm.  CBC    Component Value Date/Time   WBC 13.2 (H) 08/16/2017 0449   RBC 3.12 (L) 08/16/2017 0449   HGB 9.0 (L) 08/16/2017 0449   HGB 11.4 07/25/2017 1155   HGB 10.6 (L) 11/28/2013 1148   HCT 27.5 (L) 08/16/2017 0449   HCT 35.0 07/25/2017 1155   HCT 31.2 (L) 11/28/2013 1148   PLT 123 (L) 08/16/2017 0449   PLT 365 07/25/2017 1155   MCV 88.1 08/16/2017 0449   MCV 94 07/25/2017 1155   MCV 101.4 (H) 11/28/2013 1148   MCH 28.8 08/16/2017 0449   MCHC 32.7 08/16/2017 0449   RDW 15.9 (H) 08/16/2017 0449   RDW 13.8 07/25/2017 1155   RDW 15.4 (H) 11/28/2013 1148   LYMPHSABS 1.4 07/25/2017 1155   LYMPHSABS 0.8 (L) 11/28/2013 1148   MONOABS 0.4 06/27/2016 1041   MONOABS 0.9 11/28/2013 1148   EOSABS 0.1 07/25/2017 1155   BASOSABS 0.0 07/25/2017 1155   BASOSABS 0.0 11/28/2013 1148    BMET    Component Value Date/Time   NA 140 08/16/2017 0449   NA 139 07/25/2017 1155   NA 139 04/22/2014 1028   K 3.5 08/16/2017 0449   K 4.1 04/22/2014 1028   CL 111 08/16/2017 0449   CO2 24 08/16/2017 0449   CO2 23 04/22/2014 1028   GLUCOSE 127 (H) 08/16/2017 0449   GLUCOSE 81 04/22/2014 1028   BUN 17 08/16/2017 0449   BUN 8 07/25/2017 1155   BUN 8.8 04/22/2014 1028   CREATININE 0.66 08/16/2017 0449   CREATININE 0.8 04/22/2014 1028   CALCIUM 7.4 (L) 08/16/2017 0449   CALCIUM 9.1 04/22/2014 1028   GFRNONAA >60 08/16/2017 0449   GFRAA >60 08/16/2017 0449    INR    Component Value Date/Time   INR 2.87 08/14/2017 1400     Intake/Output Summary (Last 24 hours) at 08/16/17 0721 Last data filed at 08/16/17 0600  Gross per 24 hour  Intake              221 ml  Output             1015 ml  Net             -794 ml     Assessment/Plan:  61 y.o. female is s/p  Aortobifemoral bypass with a 14 by 8 Hemashield graft.. End2 side of the common femoral and superficial femoral artery on the right. Common femoral artery on the left. Also had reimplantation of the inferior mesenteric artery. The left iliac graft limb  And Thrombectomy of left profundus femoris artery and left femoral to above-knee popliteal bypass with translocated non-reversed great saphenous vein  And Right femoral to above-knee popliteal bypass with translocated non-reversed great saphenous vein 2 Days Post-Op  -pt bypass grafts are patent with +doppler signals bilateral DP/peroneal -pt refusing to move/get out of bed.  Needs maximum encouragement to sit on side of bed or get out of bed. -RN tells me there were left over orders from PACU that had dilaudid ordered and pt says it works better.  Will dc Morphine and use dilaudid. -dc a-line -seems to be tolerating NGT being out but not passing flatus.  Continue npo except for ice chips until return of bowel function -low grade fever 100.2-encourage IS    [name], PA-C [facility] [phone] 08/16/2017 7:21 AM  I have examined the patient, reviewed and agree with above.Palpable dorsalis pedis pulses bilaterally. All incisions healing well. Very strange affect. States that she wants to die. Explained that the soreness would be a little better today than yesterday and markedly better tomorrow. She says she doesn't  care and doesn't want to get up or mobilize. Patient was reassured. Will start clear liquids.  Curt Jews, MD 08/16/2017 10:38 AM

## 2017-08-16 NOTE — Anesthesia Postprocedure Evaluation (Signed)
Anesthesia Post Note  Patient: Erin [name]  Procedure(s) Performed: Procedure(s) (LRB): Right Femoral to Above Knee Popliteal Bypass Graft using Non-Reversed Greater Saphenous Vein Graft from Right Leg (Right)     Patient location during evaluation: PACU Anesthesia Type: General Level of consciousness: sedated and patient cooperative Pain management: pain level controlled Vital Signs Assessment: post-procedure vital signs reviewed and stable Respiratory status: spontaneous breathing Cardiovascular status: stable Anesthetic complications: no    Last Vitals:  Vitals:   08/16/17 1400 08/16/17 1500  BP: (!) 83/59 (!) 100/54  Pulse: (!) 110 96  Resp: (!) 21 12  Temp:    SpO2: 95% 96%    Last Pain:  Vitals:   08/16/17 1200  TempSrc:   PainSc: 10-Worst pain ever                 Nolon Nations

## 2017-08-17 LAB — CBC
HCT: 24 % — ABNORMAL LOW (ref 36.0–46.0)
HEMATOCRIT: 28.9 % — AB (ref 36.0–46.0)
HEMOGLOBIN: 9.3 g/dL — AB (ref 12.0–15.0)
Hemoglobin: 7.7 g/dL — ABNORMAL LOW (ref 12.0–15.0)
MCH: 28.9 pg (ref 26.0–34.0)
MCH: 28.4 pg (ref 26.0–34.0)
MCHC: 32.1 g/dL (ref 30.0–36.0)
MCHC: 32.2 g/dL (ref 30.0–36.0)
MCV: 90.2 fL (ref 78.0–100.0)
MCV: 88.4 fL (ref 78.0–100.0)
PLATELETS: 141 10*3/uL — AB (ref 150–400)
Platelets: 142 10*3/uL — ABNORMAL LOW (ref 150–400)
RBC: 2.66 MIL/uL — AB (ref 3.87–5.11)
RBC: 3.27 MIL/uL — ABNORMAL LOW (ref 3.87–5.11)
RDW: 15.6 % — AB (ref 11.5–15.5)
RDW: 16.1 % — ABNORMAL HIGH (ref 11.5–15.5)
WBC: 11.9 10*3/uL — AB (ref 4.0–10.5)
WBC: 12.9 10*3/uL — ABNORMAL HIGH (ref 4.0–10.5)

## 2017-08-17 LAB — GLUCOSE, CAPILLARY
Glucose-Capillary: 90 mg/dL (ref 65–99)
Glucose-Capillary: 89 mg/dL (ref 65–99)

## 2017-08-17 LAB — PREPARE RBC (CROSSMATCH)

## 2017-08-17 MED ORDER — SODIUM CHLORIDE 0.9 % IV SOLN
Freq: Once | INTRAVENOUS | Status: AC
  Administered 2017-08-17: 08:00:00 via INTRAVENOUS

## 2017-08-17 MED ORDER — ENOXAPARIN SODIUM 40 MG/0.4ML ~~LOC~~ SOLN
40.0000 mg | SUBCUTANEOUS | Status: DC
  Administered 2017-08-17 – 2017-08-19 (×3): 40 mg via SUBCUTANEOUS
  Filled 2017-08-17 (×3): qty 0.4

## 2017-08-17 NOTE — Progress Notes (Signed)
CSW attempted to talk to patient concerning PT recommendation for SNF- patient disoriented at this time and unable to participate.  CSW attempted to call pt husband to discuss- left message  CSW will continue to follow  Jorge Ny, [place] Social Worker [phone]

## 2017-08-17 NOTE — Progress Notes (Addendum)
AAA Progress Note    08/17/2017 7:35 AM 3 Days Post-Op  Subjective:  Looks better this am-smiling.  Says she doesn't want to move  Tm 99.6 now afebrile HR  90's-100's NSR/ST 350'K-938'H systolic 82% RA  Vitals:   08/17/17 0600 08/17/17 0700  BP:  (!) 109/58  Pulse: 99 99  Resp: 20 11  Temp:    SpO2: 93% 93%    Physical Exam: Cardiac:  regular Lungs:  Non labored Abdomen:  Soft, NT/ND; +BS; +flatus Incisions:  Midline, bilateral groins and bilateral thigh incisions are clean and dry Extremities:  Easily palpable DP pulses bilaterally; bilateral feet are warm and well perfused.  CBC    Component Value Date/Time   WBC 11.9 (H) 08/17/2017 0356   RBC 2.66 (L) 08/17/2017 0356   HGB 7.7 (L) 08/17/2017 0356   HGB 11.4 07/25/2017 1155   HGB 10.6 (L) 11/28/2013 1148   HCT 24.0 (L) 08/17/2017 0356   HCT 35.0 07/25/2017 1155   HCT 31.2 (L) 11/28/2013 1148   PLT 141 (L) 08/17/2017 0356   PLT 365 07/25/2017 1155   MCV 90.2 08/17/2017 0356   MCV 94 07/25/2017 1155   MCV 101.4 (H) 11/28/2013 1148   MCH 28.9 08/17/2017 0356   MCHC 32.1 08/17/2017 0356   RDW 15.6 (H) 08/17/2017 0356   RDW 13.8 07/25/2017 1155   RDW 15.4 (H) 11/28/2013 1148   LYMPHSABS 1.4 07/25/2017 1155   LYMPHSABS 0.8 (L) 11/28/2013 1148   MONOABS 0.4 06/27/2016 1041   MONOABS 0.9 11/28/2013 1148   EOSABS 0.1 07/25/2017 1155   BASOSABS 0.0 07/25/2017 1155   BASOSABS 0.0 11/28/2013 1148    BMET    Component Value Date/Time   NA 140 08/16/2017 0449   NA 139 07/25/2017 1155   NA 139 04/22/2014 1028   K 3.5 08/16/2017 0449   K 4.1 04/22/2014 1028   CL 111 08/16/2017 0449   CO2 24 08/16/2017 0449   CO2 23 04/22/2014 1028   GLUCOSE 127 (H) 08/16/2017 0449   GLUCOSE 81 04/22/2014 1028   BUN 17 08/16/2017 0449   BUN 8 07/25/2017 1155   BUN 8.8 04/22/2014 1028   CREATININE 0.66 08/16/2017 0449   CREATININE 0.8 04/22/2014 1028   CALCIUM 7.4 (L) 08/16/2017 0449   CALCIUM 9.1 04/22/2014 1028   GFRNONAA >60 08/16/2017 0449   GFRAA >60 08/16/2017 0449    INR    Component Value Date/Time   INR 2.87 08/14/2017 1400     Intake/Output Summary (Last 24 hours) at 08/17/17 0735 Last data filed at 08/17/17 0600  Gross per 24 hour  Intake              180 ml  Output              790 ml  Net             -610 ml     Assessment/Plan:  61 y.o. female is s/p  Aortobifemoral bypass with a 14 by 8 Hemashield graft.. End2 side of the common femoral and superficial femoral artery on the right. Common femoral artery on the left. Also had reimplantation of the inferior mesenteric artery. The left iliac graft limb And Thrombectomy of left profundus femoris artery and left femoral to above-knee popliteal bypass with translocated non-reversed great saphenous vein And Right femoral to above-knee popliteal bypass with translocated non-reversed great saphenous vein 3 Days Post-Op  -pt with easily palpable DP pulses bilaterally -she did get out of bed  to chair yesterday-needs more mobilization.  Hopefully PT will work with pt today. -clear liquids started yesterday-per RN, she did not take much in -acute surgical blood loss anemia-hgb down to 7.7 this am-EF last year 55% and her BP is tolerating.  Will hold on transfusion for now.  Check labs tomorrow -leukocytosis improving -continue IS -will discuss with Dr. Donnetta Hutching about Lovenox given drop in Hgb   [name], PA-C [facility] [phone] 08/17/2017 7:35 AM  I have examined the patient, reviewed and agree with above.Does look much better this morning. Palpable dorsalis pedis pulses bilaterally. All wounds healing. Will transfuse 1 unit packed cells. Did receive 4 units around the time of her surgery on Monday. Will advance diet. No evidence of bleeding. Will continue Lovenox for DVT prophylaxis.  I discussed with the patient and her husband present this morning that I will be out of town until Monday. Dr. Scot Dock  will care for her this weekend.  Curt Jews, MD 08/17/2017 9:49 AM

## 2017-08-17 NOTE — NC FL2 (Signed)
[place] LEVEL OF CARE SCREENING TOOL     IDENTIFICATION  Patient Name: Erin [name] Birthdate: [birth year] Sex: female Admission Date (Current Location): 08/14/2017  [facility] and [place] Number:  Herbalist and Address:  [facility], [place] [address], [place], [place] [address]      Provider Number: [id number]  Attending Physician Name and Address:  Rosetta Posner, MD  Relative Name and Phone Number:       Current Level of Care: Hospital Recommended Level of Care: [place] Prior Approval Number:    Date Approved/Denied:   PASRR Number: [id number]  Discharge Plan: SNF    Current Diagnoses: Patient Active Problem List   Diagnosis Date Noted  . Aortoiliac occlusive disease ([place]) 08/14/2017  . Hyperlipidemia associated with type 2 diabetes mellitus ([place]) 05/11/2017  . Syncope 06/07/2016  . Elevated transaminase level 06/07/2016  . Hypokalemia 06/06/2016  . Family history of early CAD 04/08/2016  . Type 2 diabetes mellitus without complication, without long-term current use of insulin ([place]) 03/18/2016  . B12 deficiency anemia 09/14/2015  . Hypothyroidism 09/10/2015  . GERD without esophagitis 09/10/2015  . BPPV (benign paroxysmal positional vertigo) 07/08/2015  . Gait instability 07/08/2015  . Neck pain 07/08/2015  . Nausea & vomiting 06/27/2015  . Anal cancer ([place]) 08/19/2013  . IBS (irritable bowel syndrome) 03/29/2013  . Chronic daily headache 03/29/2013  . 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    Orientation RESPIRATION BLADDER Height & Weight     Self, Time, Situation, Place  Normal Incontinent, External catheter Weight: 117 lb 11.6 oz (53.4 kg) Height:  5\' 3"  (160 cm)  BEHAVIORAL SYMPTOMS/MOOD NEUROLOGICAL BOWEL NUTRITION STATUS      Continent Diet  AMBULATORY STATUS COMMUNICATION OF NEEDS Skin   Extensive Assist Verbally Surgical wounds (groin- liquid adhesive)                       Personal  Care Assistance Level of Assistance  Bathing, Dressing Bathing Assistance: Maximum assistance   Dressing Assistance: Maximum assistance     Functional Limitations Info             SPECIAL CARE FACTORS FREQUENCY  PT (By licensed PT), OT (By licensed OT)     PT Frequency: 5/wk OT Frequency: 5/wk            Contractures      Additional Factors Info  Code Status, Allergies Code Status Info: FULL Allergies Info: Amitiza Lubiprostone, Nortriptyline, Sulfa Antibiotics, Atorvastatin, Claritin Loratadine, Dexamethasone, Prednisone           Current Medications (08/17/2017):  This is the current hospital active medication list Current Facility-Administered Medications  Medication Dose Route Frequency Provider Last Rate Last Dose  . 0.9 %  sodium chloride infusion  500 mL Intravenous Once PRN Rhyne, Samantha J, PA-C      . 0.9 %  sodium chloride infusion   Intravenous Continuous Early, Arvilla Meres, MD      . acetaminophen (TYLENOL) tablet 325-650 mg  325-650 mg Oral Q4H PRN Rhyne, Samantha J, PA-C       Or  . acetaminophen (TYLENOL) suppository 325-650 mg  325-650 mg Rectal Q4H PRN Rhyne, Samantha J, PA-C      . acyclovir ointment (ZOVIRAX) 5 % 1 application  1 application Topical U5K PRN Rhyne, Samantha J, PA-C      . bisacodyl (DULCOLAX) suppository 10 mg  10 mg Rectal Daily PRN Rhyne, Hulen Shouts, PA-C      .  dextrose 5 %-0.45 % sodium chloride infusion   Intravenous Continuous Rhyne, Samantha J, PA-C 100 mL/hr at 08/15/17 0600    . diphenhydrAMINE (BENADRYL) capsule 25 mg  25 mg Oral Q6H PRN Early, Arvilla Meres, MD   25 mg at 08/17/17 0800  . docusate sodium (COLACE) capsule 100 mg  100 mg Oral Daily Rhyne, Samantha J, PA-C   100 mg at 08/17/17 0800  . guaiFENesin-dextromethorphan (ROBITUSSIN DM) 100-10 MG/5ML syrup 15 mL  15 mL Oral Q4H PRN Rhyne, Samantha J, PA-C      . hydrALAZINE (APRESOLINE) injection 5 mg  5 mg Intravenous Q20 Min PRN Rhyne, Samantha J, PA-C   5 mg at 08/14/17 1929   . HYDROmorphone (DILAUDID) injection 0.5 mg  0.5 mg Intravenous Q2H PRN Rhyne, Samantha J, PA-C   0.5 mg at 08/17/17 1100  . labetalol (NORMODYNE,TRANDATE) injection 10 mg  10 mg Intravenous Q10 min PRN Rhyne, Samantha J, PA-C      . meperidine (DEMEROL) injection 6.25-12.5 mg  6.25-12.5 mg Intravenous Q5 min PRN Nolon Nations, MD      . metoprolol tartrate (LOPRESSOR) injection 2-5 mg  2-5 mg Intravenous Q2H PRN Rhyne, Samantha J, PA-C   2.5 mg at 08/16/17 2345  . metoprolol tartrate (LOPRESSOR) injection 2.5 mg  2.5 mg Intravenous Q6H Rhyne, Samantha J, PA-C   2.5 mg at 08/17/17 1100  . nystatin-triamcinolone (MYCOLOG II) cream   Topical BID PRN Rhyne, Samantha J, PA-C      . ondansetron (ZOFRAN) injection 4 mg  4 mg Intravenous Q6H PRN Rhyne, Samantha J, PA-C   4 mg at 08/16/17 2300  . pantoprazole (PROTONIX) injection 40 mg  40 mg Intravenous QHS Rosetta Posner, MD   40 mg at 08/16/17 2145   Or  . pantoprazole (PROTONIX) EC tablet 40 mg  40 mg Oral QHS Early, Arvilla Meres, MD      . phenol (CHLORASEPTIC) mouth spray 1 spray  1 spray Mouth/Throat PRN Rhyne, Samantha J, PA-C      . potassium chloride SA (K-DUR,KLOR-CON) CR tablet 20-40 mEq  20-40 mEq Oral Once PRN Gabriel Earing, PA-C         Discharge Medications: Please see discharge summary for a list of discharge medications.  Relevant Imaging Results:  Relevant Lab Results:   Additional Information SS#: [id number]  Jorge Ny, LCSW

## 2017-08-17 NOTE — Progress Notes (Signed)
Physical Therapy Treatment Patient Details Name: Erin [name] MRN: [id number] DOB: Aug 05, 1956 Today's Date: 08/17/2017    History of Present Illness 61 y.o. female who was referred by Dr. Paulla Dolly for evaluation of peripheral arterial disease.The patient reports severe bilateral leg pain affecting both thighs and calves which started about 4 years ago and has been worsening. She takes lots of ibuprofen every day for the pain. The pain happens after minimal walking distance. She also has numbness at rest.s/p Aortobifemoral bypass with a 14 by 8 Hemashield graft.. End 2 side of the common femoral and superficial femoral artery on the right. Common femoral artery on the left. Also had reimplantation of the inferior mesenteric artery. The left iliac graft limb.08/14/17 s/p surgery severe ischemia left leg post aortobifemoral bypass, s/p Thrombectomy of left profundus femoris artery and left femoral to above-knee popliteal bypass with translocated non-reversed great saphenous vein 08/14/17 s/p Right leg ischemia following aortobifemoral bypass 08/14/17  Right femoral to above-knee popliteal bypass with translocated non-reversed great saphenous vein    PT Comments    Patient is making progress toward mobility goals. Pt tolerated gait training this session with VSS. Pt required +2 assist for functional transfers and OOB mobility. Continue to progress as tolerated with anticipated d/c to SNF for further skilled PT services.    Follow Up Recommendations  SNF     Equipment Recommendations  Other (comment) (to be determined at next venue)    Recommendations for Other Services       Precautions / Restrictions Precautions Precautions: None    Mobility  Bed Mobility               General bed mobility comments: pt OOB in chair upon arrival  Transfers Overall transfer level: Needs assistance Equipment used: Rolling walker (2 wheeled) Transfers: Sit to/from Stand Sit to Stand: Mod assist;+2  physical assistance         General transfer comment: cues for technique and assist to power up into standing  Ambulation/Gait Ambulation/Gait assistance: Mod assist;+2 safety/equipment;+2 physical assistance Ambulation Distance (Feet): 160 Feet Assistive device: Rolling walker (2 wheeled) Gait Pattern/deviations: Step-through pattern;Decreased step length - right;Decreased step length - left;Decreased dorsiflexion - right;Decreased dorsiflexion - left;Trunk flexed Gait velocity: decreased   General Gait Details: pt was impulsive while ambulating and attempted to sit prematurely and reported she would "pass out"--VSS and RN present pushing chair behind; multimodal cues for posture and bilat step lengths; assist for balance and management of RW; pt with tendency to drift toward R side and required hand over hand assist to maintain safe grip on RW with R hand   Stairs            Wheelchair Mobility    Modified Rankin (Stroke Patients Only)       Balance Overall balance assessment: Needs assistance Sitting-balance support: Feet supported Sitting balance-Leahy Scale: Fair     Standing balance support: Bilateral upper extremity supported Standing balance-Leahy Scale: Poor                              Cognition Arousal/Alertness: Awake/alert Behavior During Therapy: Impulsive;WFL for tasks assessed/performed Overall Cognitive Status: Impaired/Different from baseline Area of Impairment: Attention;Following commands;Safety/judgement;Awareness;Problem solving                   Current Attention Level: Sustained   Following Commands: Follows one step commands with increased time Safety/Judgement: Decreased awareness of safety;Decreased awareness of deficits  Awareness: Intellectual Problem Solving: Requires verbal cues;Requires tactile cues General Comments: pt was a little drowsy likely due to medications and a impulsive at times      Exercises       General Comments        Pertinent Vitals/Pain Pain Assessment: Faces Faces Pain Scale: Hurts little more Pain Location: incisional sites, abdomen, groin, bilateral legs Pain Descriptors / Indicators: Grimacing;Guarding;Burning;Sore Pain Intervention(s): Limited activity within patient's tolerance;Monitored during session;Premedicated before session;Repositioned    Home Living                      Prior Function            PT Goals (current goals can now be found in the care plan section) Acute Rehab PT Goals PT Goal Formulation: With patient/family Time For Goal Achievement: 08/29/17 Potential to Achieve Goals: Fair Progress towards PT goals: Progressing toward goals    Frequency    Min 3X/week      PT Plan Current plan remains appropriate    Co-evaluation PT/OT/SLP Co-Evaluation/Treatment: Yes Reason for Co-Treatment: Complexity of the patient's impairments (multi-system involvement);For patient/therapist safety PT goals addressed during session: Mobility/safety with mobility        AM-PAC PT "6 Clicks" Daily Activity  Outcome Measure  Difficulty turning over in bed (including adjusting bedclothes, sheets and blankets)?: Unable Difficulty moving from lying on back to sitting on the side of the bed? : Unable Difficulty sitting down on and standing up from a chair with arms (e.g., wheelchair, bedside commode, etc,.)?: Unable Help needed moving to and from a bed to chair (including a wheelchair)?: A Lot Help needed walking in hospital room?: A Lot Help needed climbing 3-5 steps with a railing? : A Lot 6 Click Score: 9    End of Session Equipment Utilized During Treatment: Gait belt Activity Tolerance: Patient tolerated treatment well Patient left: with call bell/phone within reach;in chair;with nursing/sitter in room Nurse Communication: Mobility status PT Visit Diagnosis: Other abnormalities of gait and mobility (R26.89);Muscle weakness (generalized)  (M62.81);Difficulty in walking, not elsewhere classified (R26.2);Pain Pain - Right/Left:  (bilateral) Pain - part of body: Leg (abdomen and groin)     Time: 2878-6767 PT Time Calculation (min) (ACUTE ONLY): 25 min  Charges:  $Gait Training: 8-22 mins                    G Codes:       Earney Navy, PTA Pager: [phone]     Darliss Cheney 08/17/2017, 4:31 PM

## 2017-08-17 NOTE — Progress Notes (Signed)
Occupational Therapy Treatment Patient Details Name: Erin [name] MRN: [id number] DOB: 21-Apr-1956 Today's Date: 08/17/2017    History of present illness 61 y.o. female who was referred by Dr. Paulla Dolly for evaluation of peripheral arterial disease.The patient reports severe bilateral leg pain affecting both thighs and calves which started about 4 years ago and has been worsening. She takes lots of ibuprofen every day for the pain. The pain happens after minimal walking distance. She also has numbness at rest.s/p Aortobifemoral bypass with a 14 by 8 Hemashield graft.. End 2 side of the common femoral and superficial femoral artery on the right. Common femoral artery on the left. Also had reimplantation of the inferior mesenteric artery. The left iliac graft limb.08/14/17 s/p surgery severe ischemia left leg post aortobifemoral bypass, s/p Thrombectomy of left profundus femoris artery and left femoral to above-knee popliteal bypass with translocated non-reversed great saphenous vein 08/14/17 s/p Right leg ischemia following aortobifemoral bypass 08/14/17  Right femoral to above-knee popliteal bypass with translocated non-reversed great saphenous vein   OT comments  Pt seen in conjuction with PT and nsg.  Pt demonstrates improved activity tolerance - she self distracts with pain, but responds to redirection.  She requires mod - max A for ADLs and mod A +2 for functional mobility.  Continue to recommend SNF level rehab.  Will follow.   Follow Up Recommendations  SNF    Equipment Recommendations  None recommended by OT    Recommendations for Other Services      Precautions / Restrictions Precautions Precautions: None       Mobility Bed Mobility               General bed mobility comments: pt OOB in chair upon arrival  Transfers Overall transfer level: Needs assistance Equipment used: Rolling walker (2 wheeled) Transfers: Sit to/from Stand Sit to Stand: Mod assist;+2 physical  assistance         General transfer comment: cues for technique and assist to power up into standing    Balance Overall balance assessment: Needs assistance Sitting-balance support: Feet supported Sitting balance-Leahy Scale: Fair     Standing balance support: Bilateral upper extremity supported Standing balance-Leahy Scale: Poor                             ADL either performed or assessed with clinical judgement   ADL Overall ADL's : Needs assistance/impaired Eating/Feeding: Set up;Sitting   Grooming: Wash/dry hands;Wash/dry face;Oral care;Brushing hair;Set up;Sitting   Upper Body Bathing: Moderate assistance;Sitting   Lower Body Bathing: Maximal assistance;Sit to/from stand   Upper Body Dressing : Maximal assistance;Sitting   Lower Body Dressing: Total assistance;Sit to/from stand   Toilet Transfer: Moderate assistance;+2 for physical assistance;Ambulation;Comfort height toilet;BSC;RW   Toileting- Clothing Manipulation and Hygiene: Sit to/from stand;Maximal assistance       Functional mobility during ADLs: Moderate assistance;+2 for physical assistance;Rolling walker       Vision       Perception     Praxis      Cognition Arousal/Alertness: Awake/alert Behavior During Therapy: Impulsive;WFL for tasks assessed/performed Overall Cognitive Status: Impaired/Different from baseline Area of Impairment: Attention;Following commands;Safety/judgement;Awareness;Problem solving                   Current Attention Level: Selective   Following Commands: Follows one step commands with increased time;Follows one step commands consistently Safety/Judgement: Decreased awareness of safety;Decreased awareness of deficits Awareness: Intellectual Problem Solving: Requires verbal  cues;Requires tactile cues General Comments: Pt is internally distracted by pain/discomfort.  Pt requires cues to redirect to task         Exercises     Shoulder  Instructions       General Comments Pt c/o of dizziness and feeling that she was goiong to "pass out"; however, VSS throughout session.  Pt was redirected to task and cues provided     Pertinent Vitals/ Pain       Pain Assessment: Faces Faces Pain Scale: Hurts little more Pain Location: incisional sites, abdomen, groin, bilateral legs Pain Descriptors / Indicators: Grimacing;Guarding;Burning;Sore Pain Intervention(s): Monitored during session;Limited activity within patient's tolerance  Home Living                                          Prior Functioning/Environment              Frequency  Min 2X/week        Progress Toward Goals  OT Goals(current goals can now be found in the care plan section)  Progress towards OT goals: Progressing toward goals     Plan Discharge plan remains appropriate    Co-evaluation    PT/OT/SLP Co-Evaluation/Treatment: Yes Reason for Co-Treatment: Complexity of the patient's impairments (multi-system involvement);For patient/therapist safety;To address functional/ADL transfers PT goals addressed during session: Mobility/safety with mobility OT goals addressed during session: ADL's and self-care      AM-PAC PT "6 Clicks" Daily Activity     Outcome Measure   Help from another person eating meals?: A Little Help from another person taking care of personal grooming?: A Little Help from another person toileting, which includes using toliet, bedpan, or urinal?: A Lot Help from another person bathing (including washing, rinsing, drying)?: A Lot Help from another person to put on and taking off regular upper body clothing?: A Lot Help from another person to put on and taking off regular lower body clothing?: Total 6 Click Score: 13    End of Session Equipment Utilized During Treatment: Rolling walker;Gait belt  OT Visit Diagnosis: Muscle weakness (generalized) (M62.81);Other abnormalities of gait and mobility  (R26.89);Pain Pain - Right/Left: Right Pain - part of body: Leg   Activity Tolerance Patient tolerated treatment well   Patient Left in chair;with call bell/phone within reach;with nursing/sitter in room   Nurse Communication Mobility status        Time: 1202-1226 OT Time Calculation (min): 24 min  Charges: OT General Charges $OT Visit: 1 Visit OT Treatments $Therapeutic Activity: 8-22 mins  [facility], OTR/L [phone]    Lucille Passy M 08/17/2017, 6:05 PM

## 2017-08-18 LAB — COMPREHENSIVE METABOLIC PANEL
ALBUMIN: 1.8 g/dL — AB (ref 3.5–5.0)
ALK PHOS: 65 U/L (ref 38–126)
ALT: 15 U/L (ref 14–54)
ANION GAP: 8 (ref 5–15)
AST: 34 U/L (ref 15–41)
BUN: 11 mg/dL (ref 6–20)
CO2: 26 mmol/L (ref 22–32)
Calcium: 7.5 mg/dL — ABNORMAL LOW (ref 8.9–10.3)
Chloride: 104 mmol/L (ref 101–111)
Creatinine, Ser: 0.73 mg/dL (ref 0.44–1.00)
GFR calc Af Amer: >60 mL/min (ref >60)
GFR calc non Af Amer: >60 mL/min (ref >60)
GLUCOSE: 78 mg/dL (ref 65–99)
Potassium: 2.8 mmol/L — ABNORMAL LOW (ref 3.5–5.1)
SODIUM: 138 mmol/L (ref 135–145)
Total Bilirubin: 1.4 mg/dL — ABNORMAL HIGH (ref 0.3–1.2)
Total Protein: 4.5 g/dL — ABNORMAL LOW (ref 6.5–8.1)

## 2017-08-18 LAB — TYPE AND SCREEN
ABO/RH(D): A POS
ABO/RH(D): A POS
ANTIBODY SCREEN: NEGATIVE
Antibody Screen: NEGATIVE
UNIT DIVISION: 0
Unit division: 0
Unit division: 0
Unit division: 0
Unit division: 0

## 2017-08-18 LAB — CBC
HCT: 25.4 % — ABNORMAL LOW (ref 36.0–46.0)
HEMOGLOBIN: 8.4 g/dL — AB (ref 12.0–15.0)
MCH: 29.2 pg (ref 26.0–34.0)
MCHC: 33.1 g/dL (ref 30.0–36.0)
MCV: 88.2 fL (ref 78.0–100.0)
Platelets: 170 10*3/uL (ref 150–400)
RBC: 2.88 MIL/uL — ABNORMAL LOW (ref 3.87–5.11)
RDW: 16.4 % — ABNORMAL HIGH (ref 11.5–15.5)
WBC: 9.9 10*3/uL (ref 4.0–10.5)

## 2017-08-18 LAB — BPAM RBC
BLOOD PRODUCT EXPIRATION DATE: 201810042359
BLOOD PRODUCT EXPIRATION DATE: 201810092359
BLOOD PRODUCT EXPIRATION DATE: 201810092359
BLOOD PRODUCT EXPIRATION DATE: 201810102359
BLOOD PRODUCT EXPIRATION DATE: 201810102359
ISSUE DATE / TIME: 201809240805
ISSUE DATE / TIME: 201809240805
ISSUE DATE / TIME: 201809242217
ISSUE DATE / TIME: 201809242217
ISSUE DATE / TIME: 201809270914
UNIT TYPE AND RH: 6200
UNIT TYPE AND RH: 6200
UNIT TYPE AND RH: 6200
Unit Type and Rh: 6200
Unit Type and Rh: 6200

## 2017-08-18 MED ORDER — GABAPENTIN 600 MG PO TABS
300.0000 mg | ORAL_TABLET | Freq: Three times a day (TID) | ORAL | Status: DC
Start: 2017-08-18 — End: 2017-08-20
  Administered 2017-08-18 – 2017-08-19 (×4): 300 mg via ORAL
  Filled 2017-08-18 (×6): qty 1

## 2017-08-18 MED ORDER — MECLIZINE HCL 12.5 MG PO TABS: 12.5000 mg | ORAL_TABLET | Freq: Three times a day (TID) | ORAL | Status: DC | PRN

## 2017-08-18 MED ORDER — LEVOTHYROXINE SODIUM 75 MCG PO TABS
75.0000 ug | ORAL_TABLET | Freq: Every day | ORAL | Status: DC
  Administered 2017-08-19: 75 ug via ORAL
  Filled 2017-08-18 (×2): qty 1

## 2017-08-18 MED ORDER — LINACLOTIDE 145 MCG PO CAPS
145.0000 ug | ORAL_CAPSULE | Freq: Every day | ORAL | Status: DC
  Administered 2017-08-18: 145 ug via ORAL
  Filled 2017-08-18 (×3): qty 1

## 2017-08-18 MED ORDER — PRESERVISION AREDS 2 PO CAPS: ORAL_CAPSULE | Freq: Every day | ORAL | Status: DC

## 2017-08-18 MED ORDER — ASPIRIN EC 81 MG PO TBEC
81.0000 mg | DELAYED_RELEASE_TABLET | Freq: Every day | ORAL | Status: DC
  Administered 2017-08-18 – 2017-08-19 (×2): 81 mg via ORAL
  Filled 2017-08-18 (×3): qty 1

## 2017-08-18 MED ORDER — VITAMIN B-12 1000 MCG PO TABS
1000.0000 ug | ORAL_TABLET | Freq: Every day | ORAL | Status: DC
Start: 2017-08-18 — End: 2017-08-20
  Administered 2017-08-19: 1000 ug via ORAL
  Filled 2017-08-18 (×2): qty 1

## 2017-08-18 MED ORDER — PROSIGHT PO TABS
2.0000 | ORAL_TABLET | Freq: Every day | ORAL | Status: DC
  Administered 2017-08-18 – 2017-08-19 (×2): 2 via ORAL
  Filled 2017-08-18 (×3): qty 2

## 2017-08-18 MED ORDER — ONDANSETRON HCL 4 MG PO TABS: 4.0000 mg | ORAL_TABLET | Freq: Three times a day (TID) | ORAL | Status: DC | PRN

## 2017-08-18 MED ORDER — PANTOPRAZOLE SODIUM 40 MG PO TBEC: 40.0000 mg | DELAYED_RELEASE_TABLET | Freq: Every day | ORAL | Status: DC

## 2017-08-18 MED ORDER — OXYCODONE-ACETAMINOPHEN 5-325 MG PO TABS
1.0000 | ORAL_TABLET | ORAL | Status: DC | PRN
  Administered 2017-08-18 (×2): 1 via ORAL
  Administered 2017-08-18: 2 via ORAL
  Administered 2017-08-19 (×2): 1 via ORAL
  Administered 2017-08-20: 2 via ORAL
  Filled 2017-08-18 (×4): qty 1
  Filled 2017-08-18 (×2): qty 2
  Filled 2017-08-18: qty 1

## 2017-08-18 MED ORDER — ROSUVASTATIN CALCIUM 10 MG PO TABS
20.0000 mg | ORAL_TABLET | Freq: Every day | ORAL | Status: DC
  Administered 2017-08-18 – 2017-08-19 (×2): 20 mg via ORAL
  Filled 2017-08-18 (×2): qty 2
  Filled 2017-08-18: qty 1

## 2017-08-18 MED ORDER — BIOTIN 10000 MCG PO TABS: 10000.0000 ug | ORAL_TABLET | Freq: Every day | ORAL | Status: DC

## 2017-08-18 MED ORDER — FLUTICASONE PROPIONATE 50 MCG/ACT NA SUSP
1.0000 | Freq: Every day | NASAL | Status: DC
  Filled 2017-08-18: qty 16

## 2017-08-18 MED ORDER — DICYCLOMINE HCL 10 MG PO CAPS
10.0000 mg | ORAL_CAPSULE | Freq: Three times a day (TID) | ORAL | Status: DC
  Administered 2017-08-18: 10 mg via ORAL
  Filled 2017-08-18 (×8): qty 1

## 2017-08-18 MED FILL — Heparin Sodium (Porcine) Inj 1000 Unit/ML: INTRAMUSCULAR | Qty: 30 | Status: AC

## 2017-08-18 MED FILL — Sodium Chloride IV Soln 0.9%: INTRAVENOUS | Qty: 1000 | Status: AC

## 2017-08-18 NOTE — Progress Notes (Signed)
   VASCULAR SURGERY ASSESSMENT & PLAN:   4 Days Post-Op s/p: AFBG, Bilat fempop's  Tolerating diet  Start po pain meds  Transfer to 4E   Mobilize.  Anticipate D/C Sat/SUn   SUBJECTIVE:   Tolerating diet. Had a BM  PHYSICAL EXAM:   Vitals:   08/18/17 0400 08/18/17 0500 08/18/17 0600 08/18/17 0700  BP: (!) 150/71 (!) 113/59 108/60 132/66  Pulse: 94 81 87 84  Resp: 19 13 15 18   Temp:      TempSrc:      SpO2: 94% 93% 94% 96%  Weight:      Height:       Lungs some crackles at bases Feet warm  LABS:   Lab Results  Component Value Date   WBC 9.9 08/18/2017   HGB 8.4 (L) 08/18/2017   HCT 25.4 (L) 08/18/2017   MCV 88.2 08/18/2017   PLT 170 08/18/2017   Lab Results  Component Value Date   CREATININE 0.66 08/16/2017   CBG (last 3)   Recent Labs  08/15/17 0731 08/17/17 1147 08/17/17 1600  GLUCAP 134* 90 89    PROBLEM LIST:    Active Problems:   Aortoiliac occlusive disease (HCC)   CURRENT MEDS:   . docusate sodium  100 mg Oral Daily  . enoxaparin (LOVENOX) injection  40 mg Subcutaneous Q24H  . metoprolol tartrate  2.5 mg Intravenous Q6H  . pantoprazole (PROTONIX) IV  40 mg Intravenous QHS   Or  . pantoprazole  40 mg Oral QHS    Gae Gallop Beeper: [phone] Office: [phone] 08/18/2017

## 2017-08-18 NOTE — Clinical Social Work Note (Signed)
Clinical Social Work Assessment  Patient Details  Name: Erin [name] MRN: [id number] Date of Birth: 1955/12/26  Date of referral:  08/18/17               Reason for consult:  Facility Placement                Permission sought to share information with:  Family Supports Permission granted to share information::  Yes, Verbal Permission Granted  Name::     Inocencia Murtaugh  Relationship::  Spouse  Contact Information:  [phone]  Housing/Transportation Living arrangements for the past 2 months:  Single Family Home Source of Information:  Spouse Patient Interpreter Needed:  None Criminal Activity/Legal Involvement Pertinent to Current Situation/Hospitalization:  No - Comment as needed Significant Relationships:  Spouse Lives with:  Spouse Do you feel safe going back to the place where you live?  Yes Need for family participation in patient care:  Yes (Comment)  Care giving concerns:  Patient spouse initially concerned about consenting to SNF without a conversation with patient, however with further explanation that it was not a binding agreement, patient spouse agreeable to initiate search.   Social Worker assessment / plan:  Holiday representative spoke with patient spouse over the phone to offer support and discuss patient needs at discharge.  Patient currently living at home with spouse prior to hospitalization.  Patient husband is agreeable to bed search and once offers are in place he will converse with patient about discharge plan.  CSW to initiate search and will follow up with patient and family regarding available bed offers.  CSW remains available for support and to facilitate patient discharge needs once medically stable.  Employment status:  Therapist, music:  Managed Care PT Recommendations:  [place] / Referral to community resources:  [place]  Patient/Family's Response to care:  Patient spouse verbalized  understanding of CSW role and appreciation for support and concern.  Patient spouse is agreeable with SNF search at this time.  Patient/Family's Understanding of and Emotional Response to Diagnosis, Current Treatment, and Prognosis:  Patient spouse is appropriately defensive regarding discharge plans and is acting in the best interest of the patient.  Patient spouse aware that patient will have limitations at discharge and is agreeable with additional rehab prior to return home.  Emotional Assessment Appearance:  Appears stated age Attitude/Demeanor/Rapport:  Unable to Assess (Disoriented) Affect (typically observed):  Unable to Assess (Disoriented) Orientation:  Oriented to Self Alcohol / Substance use:  Not Applicable Psych involvement (Current and /or in the community):  No (Comment)  Discharge Needs  Concerns to be addressed:  Discharge Planning Concerns Readmission within the last 30 days:  No Current discharge risk:  None Barriers to Discharge:  Continued Medical Work up  [employer], [place]

## 2017-08-18 NOTE — Progress Notes (Signed)
Assuming care of patient after receiving from 2 heart. Patient safely walked to bed, tele applied, no signs of distress. SR on tele. Continuing to monitor.

## 2017-08-18 NOTE — Progress Notes (Signed)
PHARMACIST - PHYSICIAN ORDER COMMUNICATION  CONCERNING: P&T Medication Policy on Herbal Medications  DESCRIPTION:  This patient's order for: Biotin  has been noted.  This product(s) is classified as an "herbal" or natural product. Due to a lack of definitive safety studies or FDA approval, nonstandard manufacturing practices, plus the potential risk of unknown drug-drug interactions while on inpatient medications, the Pharmacy and Therapeutics Committee does not permit the use of "herbal" or natural products of this type within [facility].   ACTION TAKEN: The pharmacy department is unable to verify this order at this time and your patient has been informed of this safety policy. Please reevaluate patient's clinical condition at discharge and address if the herbal or natural product(s) should be resumed at that time.  Erin Hearing PharmD., BCPS Clinical Pharmacist Pager [phone] 08/18/2017 9:40 AM

## 2017-08-19 LAB — CBC
HCT: 26.5 % — ABNORMAL LOW (ref 36.0–46.0)
Hemoglobin: 8.6 g/dL — ABNORMAL LOW (ref 12.0–15.0)
MCH: 28.5 pg (ref 26.0–34.0)
MCHC: 32.5 g/dL (ref 30.0–36.0)
MCV: 87.7 fL (ref 78.0–100.0)
Platelets: 221 10*3/uL (ref 150–400)
RBC: 3.02 MIL/uL — ABNORMAL LOW (ref 3.87–5.11)
RDW: 15.5 % (ref 11.5–15.5)
WBC: 7.2 10*3/uL (ref 4.0–10.5)

## 2017-08-19 NOTE — Progress Notes (Signed)
   VASCULAR SURGERY ASSESSMENT & PLAN:   5 Days Post-Op s/p: Aortobifemoral bypass and bilateral femoropopliteal bypass grafts.  Tolerating diet. Pain controlled on by mouth medications.  If she does well ambulating today, she should be ready for discharge tomorrow.   SUBJECTIVE:   Tolerating diet. Has moved her bowels.  PHYSICAL EXAM:   Vitals:   08/18/17 1526 08/18/17 1750 08/18/17 2100 08/19/17 0403  BP: 132/61 (!) 141/58 (!) 160/73 128/84  Pulse: 81 64 84 87  Resp: 17 16 17 16   Temp: 98.3 F (36.8 C) 98.4 F (36.9 C) 98 F (36.7 C) 99.3 F (37.4 C)  TempSrc: Oral Oral Oral Oral  SpO2:  100% 98% 97%  Weight:    113 lb 6.4 oz (51.4 kg)  Height:       Lungs clear bilaterally. Abdomen is soft and nontender. Incisions look fine. Palpable pedal pulses.  LABS:   Lab Results  Component Value Date   WBC 7.2 08/19/2017   HGB 8.6 (L) 08/19/2017   HCT 26.5 (L) 08/19/2017   MCV 87.7 08/19/2017   PLT 221 08/19/2017   Lab Results  Component Value Date   CREATININE 0.73 08/18/2017   CBG (last 3)   Recent Labs  08/17/17 1147 08/17/17 1600  GLUCAP 90 89    PROBLEM LIST:    Active Problems:   Aortoiliac occlusive disease (HCC)   CURRENT MEDS:   . aspirin EC  81 mg Oral Daily  . dicyclomine  10 mg Oral TID AC  . docusate sodium  100 mg Oral Daily  . enoxaparin (LOVENOX) injection  40 mg Subcutaneous Q24H  . fluticasone  1 spray Each Nare Daily  . gabapentin  300 mg Oral TID  . levothyroxine  75 mcg Oral QAC breakfast  . linaclotide  145 mcg Oral QAC breakfast  . metoprolol tartrate  2.5 mg Intravenous Q6H  . multivitamin  2 tablet Oral Daily  . pantoprazole  40 mg Oral QHS  . rosuvastatin  20 mg Oral Daily  . cyanocobalamin  1,000 mcg Oral Daily    Gae Gallop Beeper: [phone] Office: [phone] 08/19/2017

## 2017-08-19 NOTE — Progress Notes (Signed)
Site left thigh area oozing  Dark blood,times 2 today. Steri strips applied to help.  Mervyn Skeeters, RN

## 2017-08-19 NOTE — Progress Notes (Addendum)
[facility] [place]  Subjective  - Doing much better.   Objective 128/84 87 99.3 F (37.4 C) (Oral) 16 97%  Intake/Output Summary (Last 24 hours) at 08/19/17 0713 Last data filed at 08/19/17 0400  Gross per 24 hour  Intake              360 ml  Output             1300 ml  Net             -940 ml    Palpable B DP Groins soft without hematoma Abdomin soft with positive BS Heart RRR Lungs non labored breathing  Assessment/Planning: 61 y.o. female is s/p  Aortobifemoral bypass with a 14 by 8 Hemashield graft.. End2 side of the common femoral and superficial femoral artery on the right. Common femoral artery on the left. Also had reimplantation of the inferior mesenteric artery. The left iliac graft limb And Thrombectomy of left profundus femoris artery and left femoral to above-knee popliteal bypass with translocated non-reversed great saphenous vein And Right femoral to above-knee popliteal bypass with translocated non-reversed great saphenous vein 5 Days Post-Op  Tolerating PO without N/V Pain control is improving Hypokalemia will replace, HGB stable Plan discharge for tomorrow  Laurence Slate [facility] 08/19/2017 7:13 AM --  Laboratory Lab Results:  Recent Labs  08/18/17 0619 08/19/17 0400  WBC 9.9 7.2  HGB 8.4* 8.6*  HCT 25.4* 26.5*  PLT 170 221   BMET  Recent Labs  08/18/17 0619  NA 138  K 2.8*  CL 104  CO2 26  GLUCOSE 78  BUN 11  CREATININE 0.73  CALCIUM 7.5*    COAG Lab Results  Component Value Date   INR 2.87 08/14/2017   INR 0.95 08/14/2017   INR 1.0 07/25/2017   No results found for: PTT  I have interviewed the patient and examined the patient. I agree with the findings by the PA.  Gae Gallop, MD [phone]

## 2017-08-20 LAB — CBC
HCT: 26.5 % — ABNORMAL LOW (ref 36.0–46.0)
HEMOGLOBIN: 8.7 g/dL — AB (ref 12.0–15.0)
MCH: 28.8 pg (ref 26.0–34.0)
MCHC: 32.8 g/dL (ref 30.0–36.0)
MCV: 87.7 fL (ref 78.0–100.0)
Platelets: 276 10*3/uL (ref 150–400)
RBC: 3.02 MIL/uL — ABNORMAL LOW (ref 3.87–5.11)
RDW: 15.4 % (ref 11.5–15.5)
WBC: 11.4 10*3/uL — AB (ref 4.0–10.5)

## 2017-08-20 MED ORDER — OXYCODONE-ACETAMINOPHEN 5-325 MG PO TABS: 1.0000 | ORAL_TABLET | ORAL | 0 refills | Status: DC | PRN

## 2017-08-20 MED ORDER — ONDANSETRON HCL 4 MG PO TABS: 4.0000 mg | ORAL_TABLET | Freq: Three times a day (TID) | ORAL | 0 refills | Status: DC | PRN

## 2017-08-20 NOTE — Progress Notes (Signed)
Leaving unit now. Gave another dose of pain medication, patients spouse is driving patient home. Leaving unit via wheelchair, accompanied by Nurse

## 2017-08-20 NOTE — Progress Notes (Signed)
Patient refused all scheduled am medications, stating will take at home. Medicated for left leg pain will monitor for an hour then send home. Patient spouse is at bedside, both verbalized understanding of all verbal and written discharge instructions. Written prescription given.

## 2017-08-20 NOTE — Care Management Note (Addendum)
Case Management Note Original Note Created Zenon Mayo, RN 08/15/2017, 1:32 PM   Patient Details  Name: Erin [name] MRN: [id number] Date of Birth: May 27, 1956  Subjective/Objective:  From home with spouse, POD 1 right fem pop bypass .  Has doppler pulses in both feet, replete K, NGT with minimal output, may start lovenox tomorrow, cont npo - no BS or flatus.   9/27 [place], BSN - per pt eval rec SNF, CSW aware.                  Action/Plan: NCM will follow for dc needs.   Expected Discharge Date:  08/20/17               Expected Discharge Plan:  [place]  In-House Referral:  NA  Discharge planning Services  CM Consult  Post Acute Care Choice:  Home Health Choice offered to:  Patient, Spouse  DME Arranged:  N/A DME Agency:  NA  HH Arranged:  PT HH Agency:  [facility]  Status of Service:  Completed, signed off  If discussed at [place] of Stay Meetings, dates discussed:    Discharge Disposition: home/home health   Additional Comments:  08/20/17- 0900- Marvetta Gibbons RN, CM- noted pt for d/c later today- has HHPT order- spoke with pt and spouse at bedside- asked about STSNF placement- pt states she wants to go home- discussed recommendation by PT for rehab- pt again states she would rather go home- choice offered for [facility] agency- per pt and spouse they have used [facility] in past and want to use them again- pt reports she has needed DME at home- referral [facility] to [facility] with [facility] for HHPT- per Riki Sheer pt was already active with them for PT/OT services.   Dahlia Client Ashley, RN 08/20/2017, 9:21 AM [phone]

## 2017-08-20 NOTE — Progress Notes (Signed)
[facility] [place]  Subjective  - Bloody dehiscence of the above knee incision yesterday afternoon.  Steri strips placed and dry guaze.  Poor intake with diet, loss of appetitive.  Right LQ pain on/off daily.     Objective (!) 141/70 95 98.9 F (37.2 C) (Oral) (!) 23 99%  Intake/Output Summary (Last 24 hours) at 08/20/17 0727 Last data filed at 08/19/17 0900  Gross per 24 hour  Intake              240 ml  Output                0 ml  Net              240 ml    Palpable DP 2+ B Abdomin soft with tenderness right LQ, positive BS,Incision healing well. Left leg SS drainage on guaze, steri strips in place, compartment s soft B groins soft without hematoma Heart RRR Lungs non labored breathing  Assessment/Planning: 61 y.o.femaleis s/p  Aortobifemoral bypass with a 14 by 8 Hemashield graft.. End2 side of the common femoral and superficial femoral artery on the right. Common femoral artery on the left. Also had reimplantation of the inferior mesenteric artery. The left iliac graft limb And Thrombectomy of left profundus femoris artery and left femoral to above-knee popliteal bypass with translocated non-reversed great saphenous vein And Right femoral to above-knee popliteal bypass with translocated non-reversed great saphenous vein 6 Days Post-Op  Plan for discharge to SNF, we may need to watch the above knee incision for one more day pending exam by Dr. Scot Dock this am.  Encourage PO intake HGB stable 8.7 WBC elevated from yesterday 7.2 now 11.4 with Tm 99.3 Encourage IS at bedside   Laurence Slate [facility] 08/20/2017 7:27 AM --  Laboratory Lab Results:  Recent Labs  08/19/17 0400 08/20/17 0338  WBC 7.2 11.4*  HGB 8.6* 8.7*  HCT 26.5* 26.5*  PLT 221 276   BMET  Recent Labs  08/18/17 0619  NA 138  K 2.8*  CL 104  CO2 26  GLUCOSE 78  BUN 11  CREATININE 0.73  CALCIUM 7.5*    COAG Lab Results  Component Value Date   INR  2.87 08/14/2017   INR 0.95 08/14/2017   INR 1.0 07/25/2017   No results found for: PTT

## 2017-08-20 NOTE — Plan of Care (Signed)
Problem: Education: Goal: Knowledge of [facility] Education information/materials will improve Outcome: Progressing POC and pain management reviewed with pt.   

## 2017-08-22 NOTE — Discharge Summary (Signed)
[facility] Discharge Summary   Patient ID:  Erin [name] MRN: [id number] DOB/AGE: [birth year] 61 y.o.  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 Allergy   . Alopecia   . 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 Anxiety   . B12 deficiency anemia 09/14/2015  . Blood transfusion without reported diagnosis   . Chronic back pain   . Chronic daily headache 03/29/2013   takes bc powder  . Closed right hip fracture ([place]) 09/10/2015  . Depression   . DM (diabetes mellitus) ([place])    diet controlled; does not check BG  . GERD (gastroesophageal reflux disease)   . History of hiatal hernia   . HLD (hyperlipidemia)   . Hot flashes   . Hyperlipemia 04/11/2014  . Hypokalemia 05/2016  . Hypothyroidism 09/10/2015  . IBS (irritable bowel syndrome) 03/29/2013  . 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 Tubular adenoma of colon 09/08/2003  . Vertigo   . Wears glasses     Procedure(s): Right Femoral to Above Knee Popliteal Bypass Graft using Non-Reversed Greater Saphenous Vein Graft from Right Leg  Discharged Condition: good  HPI: History of Present Illness: This is a 61 y.o. female who underwent aortogram with BLE runoff this morning by Dr. Fletcher Anon.  She has a hx of rectal cancer 4 years ago with 28 sessions of radiation therapy.  She states that this caused blisters and burned her.  She did not have any abdominal surgery.  She states that she did used to smoke, but quit when she was diagnosed with rectal cancer.    She states that her  legs started bothering her around the time she was diagnosed with cancer.  Over the recent months, she has started having pain at rest in both legs but more so the right.  She also get pain in both legs with walking again right greater than left.   She underwent ABI's on 07/17/17 and revealed 0.67 on the right and 0.55 on the left.   She is on synthroid for hypothyroidism.  She takes Neurontin daily.  She is on Zetia for cholesterol management.  She is allergic to Atorvastatin, but on Crestor.  She takes a daily aspirin.   She has diet controlled diabetes.  She has hx of IBS.   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   ABI's 07/17/17: Right:  0.67 Left:  0.55  underwent aortogram with BLE runoff, which revealed a small AAA with mild narrowing in the distal aorta.  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  She does have 3 vessel runoff.  She has moderate diffused disease affecting the left common iliac artery, occluded internal iliac artery, occluded proximal SFA with reconstitution distally via collaterals from the profunda with three-vessel runoff below the knee.  Hospital Course:  Erin [name] is a 61 y.o. female is S/P   Procedure # 1 Aortobifemoral bypass with a 14 by 8 Hemashield graft.. End 2 side of the common femoral and superficial femoral artery on  the right. Common femoral artery on the left. Also had reimplantation of the inferior mesenteric artery. The left iliac graft limb  Post op in recovery room: Patient ID: Erin [name], female   DOB: June 05, 1956, 61 y.o.   MRN: [id number] Hemodynamically stable in PACU. Is able to move and feel both feet. She has audible Doppler flow in her right foot but no audible flow in her left foot. This is expected since she did have very poor outflow in her right leg bypass but much better in her left  with the larger patent deep femoral artery. 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 Discussed this with the patient who still is somewhat sedated from surgery. Also with her first husband explaining that this needs to be returned to the operating room for exploration and possible revision and even possible femoral to popliteal bypass. Will return to the operating room now. Procedure # 2 08/14/2017 Thrombectomy of left profundus femoris artery and left femoral to above-knee popliteal bypass with translocated non-reversed great saphenous vein. 9:57pm 08/14/2017 Patient ID: Erin [name], female   DOB: 03-20-1956, 61 y.o.   MRN: [id number]  Patient developed progressive ischemia right foot. She had an extremely small profunda is the only outflow. No Doppler signal in her foot. Will return to operating room tonight for right femoral to popliteal bypass with no evidence SFA occlusion. Discussed with the patient and husband present.   Procedure # 3 Right femoral to above-knee popliteal bypass with translocated non-reversed great saphenous vein  POD#1 pt with +doppler signals in both feet -left calf is tender to palpation, however all compartments are soft bilaterally.  Discuss with Dr. Donnetta Hutching and she was tender when she returned to the OR for the right leg.  Will give 2g Mag. -hypokalemia-receiving K+ supplement now -NGT with minimal output at 50cc-most likely will dc-Dr. Early to determine -acute surgical blood loss anemia-received total of 4 units of PRBC's -DVT prophylaxis:  SCD's for now-if hgb okay tomorrow-will start Lovenox at that time. -continue npo for now as she has no BS or flatus   POD# 3 Palpable dorsalis pedis pulses bilaterally. All wounds healing. Will transfuse 1 unit packed cells. Did receive 4 units around the time of her surgery on Monday. Will advance diet. No evidence of bleeding. Will continue Lovenox for DVT prophylaxis.  I discussed with the patient and her husband  present this morning that I will be out of town until Monday. Dr. Scot Dock will care for her this weekend.  5 total units transfused 08/14/2017 - 08/17/2017 due to surgical blood loss anemia.   Palpable DP 2+ B Abdomin soft with tenderness right LQ, positive BS,Incision healing well. Left leg SS drainage on guaze, steri strips in place, compartment s soft B groins soft without hematoma Heart RRR Lungs non labored breathing  Assessment/Planning: 61 y.o.femaleis s/p  Aortobifemoral bypass with a 14 by 8 Hemashield graft.. End2 side of the common femoral and superficial femoral artery on the right. Common femoral artery on the left. Also had reimplantation of the inferior mesenteric artery. The left iliac graft limb And Thrombectomy of left profundus femoris artery and left femoral to above-knee popliteal bypass with translocated non-reversed great saphenous vein And Right femoral to above-knee popliteal bypass with translocated non-reversed great saphenous vein 6Days Post-Op  Plan for discharge to SNF, we may need to watch the above knee incision for one more day pending exam by Dr. Scot Dock this am.  Encourage PO intake HGB stable 8.7  WBC elevated from yesterday 7.2 now 11.4 with Tm 99.3 Encourage IS at bedside  Stable for discharge home today.      Significant Diagnostic Studies: CBC Lab Results  Component Value Date   WBC 11.4 (H) 08/20/2017   HGB 8.7 (L) 08/20/2017   HCT 26.5 (L) 08/20/2017   MCV 87.7 08/20/2017   PLT 276 08/20/2017    BMET    Component Value Date/Time   NA 138 08/18/2017 0619   NA 139 07/25/2017 1155   NA 139 04/22/2014 1028   K 2.8 (L) 08/18/2017 0619   K 4.1 04/22/2014 1028   CL 104 08/18/2017 0619   CO2 26 08/18/2017 0619   CO2 23 04/22/2014 1028   GLUCOSE 78 08/18/2017 0619   GLUCOSE 81 04/22/2014 1028   BUN 11 08/18/2017 0619   BUN 8 07/25/2017 1155   BUN 8.8 04/22/2014 1028   CREATININE 0.73 08/18/2017 0619   CREATININE 0.8  04/22/2014 1028   CALCIUM 7.5 (L) 08/18/2017 0619   CALCIUM 9.1 04/22/2014 1028   GFRNONAA >60 08/18/2017 0619   GFRAA >60 08/18/2017 0619   COAG Lab Results  Component Value Date   INR 2.87 08/14/2017   INR 0.95 08/14/2017   INR 1.0 07/25/2017     Disposition:  Discharge to :Home Discharge Instructions    Call MD for:  redness, tenderness, or signs of infection (pain, swelling, bleeding, redness, odor or green/yellow discharge around incision site)    Complete by:  As directed    Call MD for:  severe or increased pain, loss or decreased feeling  in affected limb(s)    Complete by:  As directed    Call MD for:  temperature >100.5    Complete by:  As directed    Discharge instructions    Complete by:  As directed    You may shower as needed, dry incisions well.  Slowly increase activity as you tolerate.   Driving Restrictions    Complete by:  As directed    No driving for 2-3 weeks   Increase activity slowly    Complete by:  As directed    Walk with assistance use walker or cane as needed   Lifting restrictions    Complete by:  As directed    No heavy lifting for 2-3 weeks   Resume previous diet    Complete by:  As directed      Allergies as of 08/20/2017      Reactions   Amitiza [lubiprostone] Diarrhea   Uncontrollable diarrhea   Nortriptyline Other (See Comments)   Stomach distention   Sulfa Antibiotics Nausea And Vomiting, Other (See Comments)   GI DISTRESS PAIN   Atorvastatin Nausea And Vomiting   Claritin [loratadine] Other (See Comments)   Hot flashes   Dexamethasone Itching   Itching   Prednisone Rash      Medication List    STOP taking these medications   amoxicillin-clavulanate 875-125 MG tablet Commonly known as:  AUGMENTIN     TAKE these medications   acyclovir ointment 5 % Commonly known as:  ZOVIRAX Apply 1 application topically every 3 (three) hours. What changed:  when to take this  reasons to take this   aspirin EC 81 MG  tablet Take 81 mg by mouth daily.   Biotin 10000 MCG Tabs Take 10,000 mcg by mouth daily.   cyanocobalamin 1000 MCG tablet Take 1 tablet (1,000 mcg total) by mouth daily.   dicyclomine 10 MG capsule Commonly known  as:  BENTYL TAKE ONE CAPSULE BY MOUTH THREE TIMES DAILY BEFORE MEALS. DUE FOR FOLLOW UP IN MAY, NEEDS TO SCHEDULE AN APPOINTMENT What changed:  See the new instructions.   fluticasone 50 MCG/ACT nasal spray Commonly known as:  FLONASE USE 1 SPRAY IN EACH NOSTRIL DAILY What changed:  See the new instructions.   gabapentin 600 MG tablet Commonly known as:  NEURONTIN TAKE 1 TABLET BY MOUTH THREE TIMES DAILY GENERIC EQUIVALENT FOR NEURONTIN   ibuprofen 200 MG tablet Commonly known as:  ADVIL,MOTRIN Take 800 mg by mouth 4 (four) times daily as needed (for pain.).   levothyroxine 75 MCG tablet Commonly known as:  SYNTHROID, LEVOTHROID TAKE 1 TABLET BY MOUTH DAILY GENERIC EQUIVALENT FOR SYNTHROID   LINZESS 145 MCG Caps capsule Generic drug:  linaclotide TAKE ONE CAPSULE BY MOUTH DAILY BEFORE BREAKFAST   meclizine 12.5 MG tablet Commonly known as:  ANTIVERT Take 1 tablet (12.5 mg total) by mouth 3 (three) times daily as needed for dizziness.   nystatin-triamcinolone cream Commonly known as:  MYCOLOG II APPLY TO CORNERS OF THE MOUTH TWICE A DAY AS NEEDED FOR CRACKING/BURNING   omeprazole 40 MG capsule Commonly known as:  PRILOSEC TAKE ONE CAPSULE BY MOUTH DAILY. NEED APPOINTMENT FOR FURTHER REFILLS   ondansetron 4 MG tablet Commonly known as:  ZOFRAN Take 1 tablet (4 mg total) by mouth every 8 (eight) hours as needed for nausea or vomiting. What changed:  Another medication with the same name was added. Make sure you understand how and when to take each.   ondansetron 4 MG tablet Commonly known as:  ZOFRAN Take 1 tablet (4 mg total) by mouth every 8 (eight) hours as needed for nausea or vomiting. What changed:  You were already taking a medication with the same  name, and this prescription was added. Make sure you understand how and when to take each.   oxyCODONE-acetaminophen 5-325 MG tablet Commonly known as:  PERCOCET/ROXICET Take 1-2 tablets by mouth every 4 (four) hours as needed for severe pain.   PRESERVISION AREDS 2 PO Take 1 capsule by mouth daily.   rosuvastatin 20 MG tablet Commonly known as:  CRESTOR Take 1 tablet (20 mg total) by mouth daily.      Verbal and written Discharge instructions given to the patient. Wound care per Discharge AVS Follow-up Information    Early, Arvilla Meres, MD Follow up in 2 week(s).   Specialties:  Vascular Surgery, Cardiology Why:  office f/u in 2-3 weeks office will call with appt. Contact information: [place] [address] [phone]        [facility] Follow up.   Why:  HHPT/OT arranged- they will call you to set up home visits Contact information: [address] [place] [address] [phone]           Signed: Laurence Slate [facility] 08/22/2017, 11:48 AM [facility] Discharge Summary  - For VQI Registry use --- Instructions: Press F2 to tab through selections.  Delete question if not applicable.   Post-op:  Wound infection: No  Graft infection: No  Transfusion: Yes  If yes, 5 units given New Arrhythmia: No Ipsilateral amputation: [x ] no, [ ]  Minor, [ ]  BKA, [ ]  AKA Discharge patency: [x ] Primary, [ ]  Primary assisted, [ ]  Secondary, [ ]  Occluded Patency judged by: [ ]  Dopper only, [ ]  Palpable graft pulse, [x ] Palpable distal pulse, [ ]  ABI inc. > 0.15, [ ]   Duplex  D/C Ambulatory Status: Ambulatory with Assistance  Complications: MI: [x ] No, [ ]  Troponin only, [ ]  EKG or Clinical CHF: No Resp failure: [x ] none, [ ]  Pneumonia, [ ]  Ventilator Chg in renal function: [ ]  none, [ ]  Inc. Cr > 0.5, [ ]  Temp. Dialysis, [ ]  Permanent dialysis Stroke: [x ] None, [ ]  Minor, [ ]  Major Return to OR: Yes  Reason for  return to OR: [ ]  Bleeding, [ ]  Infection, [x ] Thrombosis, [ ]  Revision  Discharge medications: Statin use:  Yes ASA use:  Yes Plavix use:  No  for medical reason   Beta blocker use: No  for medical reason   Coumadin use: No  for medical reason

## 2017-08-23 ENCOUNTER — Telehealth: Payer: Self-pay

## 2017-08-23 ENCOUNTER — Telehealth: Payer: Self-pay | Admitting: Vascular Surgery

## 2017-08-23 NOTE — Telephone Encounter (Signed)
-----   Message from Mena Goes, RN sent at 08/20/2017  2:36 PM EDT ----- Regarding: 2 weeks with Dr. Donnetta Hutching   ----- Message ----- From: Ulyses Amor, PA-C Sent: 08/20/2017   8:35 AM To: [facility] Charge Pool  F/U with DR. Early in 2 weeks s/p Aorto by fem left fem-pop by pass

## 2017-08-23 NOTE — Telephone Encounter (Signed)
Returned call, pt stated she is not tolerating the Oxycodone 5-325 mg tablets since she started taking them on Sunday, she stated "it makes me sick to my stomach". Pt stated she takes one or sometimes two every 4-6 hours and uses tylenol at times between, husband informed that she has only had a popsicle, a soon full mashed potatoes, two containers of jello and a bite of a chicken tender since she was D/C on Sunday. Stated she is tolerating drinking water and soda well. Pt stated she has not had a bowel movement. Advised that she has to start eating, suggested that she eat small frequent meals to see if this helps with her upset stomach. If this does not help I advised her to contact our office to see if we can change her medication. Pt verbalized understanding and agrees with this plan.

## 2017-08-23 NOTE — Telephone Encounter (Signed)
Sched appt 09/20/17 at 9:15. Spoke to pt's husband.

## 2017-08-30 ENCOUNTER — Telehealth: Payer: Self-pay

## 2017-08-30 ENCOUNTER — Other Ambulatory Visit: Payer: Self-pay

## 2017-08-30 DIAGNOSIS — I739 Peripheral vascular disease, unspecified: Secondary | ICD-10-CM

## 2017-08-30 MED ORDER — TRAMADOL HCL 50 MG PO TABS: 50.0000 mg | ORAL_TABLET | Freq: Four times a day (QID) | ORAL | 0 refills | Status: DC | PRN

## 2017-08-30 NOTE — Telephone Encounter (Signed)
Returned call to pt's husband - stated pt only has one oxycodone left and would like to come by our office an pick up a prescription. S/w Erin [name] and she confirmed that she is eating a lot better than she was last week and she is tolerating the oxycodone much better now. Stated that her pain has got better and hangs around a 6/10. Pt denied fever/chills. Spoke with Dr. Oneida Alar in the office, husband will pick up Rx for Tramadol 50mg  today.

## 2017-09-04 ENCOUNTER — Encounter: Payer: Self-pay | Admitting: Vascular Surgery

## 2017-09-05 ENCOUNTER — Other Ambulatory Visit: Payer: Self-pay | Admitting: Vascular Surgery

## 2017-09-05 ENCOUNTER — Ambulatory Visit (INDEPENDENT_AMBULATORY_CARE_PROVIDER_SITE_OTHER): Payer: Self-pay | Admitting: Vascular Surgery

## 2017-09-05 ENCOUNTER — Encounter: Payer: Self-pay | Admitting: Vascular Surgery

## 2017-09-05 VITALS — BP 104/74 | HR 105 | Temp 97.9°F | Resp 20 | Ht 63.0 in | Wt 96.0 lb

## 2017-09-05 DIAGNOSIS — I739 Peripheral vascular disease, unspecified: Secondary | ICD-10-CM

## 2017-09-05 DIAGNOSIS — I7409 Other arterial embolism and thrombosis of abdominal aorta: Secondary | ICD-10-CM

## 2017-09-05 DIAGNOSIS — R112 Nausea with vomiting, unspecified: Secondary | ICD-10-CM

## 2017-09-05 MED ORDER — ONDANSETRON HCL 4 MG PO TABS: 4.0000 mg | ORAL_TABLET | Freq: Three times a day (TID) | ORAL | 0 refills | Status: DC | PRN

## 2017-09-05 NOTE — Progress Notes (Signed)
Patient name: Erin [name] MRN: [id number] DOB: 1956/08/22 Sex: female  REASON FOR VISIT: Follow-up recent aortobifemoral bypass and bilateral vein femoropopliteal bypasses  HPI: Erin [name] is a 61 y.o. female here today for follow-up.  She had a very complex recent hospitalization.  She had critical ischemia to her lower extremities but worse right than left.  She underwent a aortobifemoral bypass on 08/14/2017.  She had extremely unusual course of events following this.  She was found to develop loss of flow in her left leg following the surgery and underwent urgent left femoral to above-knee popliteal bypass with saphenous vein.  She was returned to the intensive care unit and then developed more profound ischemia in her right leg and was returned for a third time for right femoral to above-knee popliteal bypass with great saphenous vein.  She had a 7-day hospitalization was discharged to home.  She is here today with her husband.  She does report being very weak.  Her bowel function is returned to normal.  She reports that she is taking one Ultram per day for discomfort.  Poor appetite and is still somewhat nauseous.  She and her husband report that she has had episodes in the past where she had severe symptomatic hypokalemia.  I have recommended that she contact her medical doctor to have her chemistries checked.  He has no symptoms of hypokalemia.  Current Outpatient Prescriptions  Medication Sig Dispense Refill  . acyclovir ointment (ZOVIRAX) 5 % Apply 1 application topically every 3 (three) hours. (Patient taking differently: Apply 1 application topically every 3 (three) hours as needed (for fever blister). ) 15 g 1  . aspirin EC 81 MG tablet Take 81 mg by mouth daily.    . Biotin 10000 MCG TABS Take 10,000 mcg by mouth daily.    Marland Kitchen dicyclomine (BENTYL) 10 MG capsule TAKE ONE CAPSULE BY MOUTH THREE TIMES DAILY BEFORE MEALS. DUE FOR FOLLOW UP IN MAY,  NEEDS TO SCHEDULE AN APPOINTMENT (Patient taking differently: TAKE ONE CAPSULE BY MOUTH THREE TIMES DAILY BEFORE MEALS) 270 capsule 1  . fluticasone (FLONASE) 50 MCG/ACT nasal spray USE 1 SPRAY IN EACH NOSTRIL DAILY (Patient taking differently: USE 1 SPRAY IN EACH NOSTRIL DAILY AT BEDTMIE) 16 g 5  . gabapentin (NEURONTIN) 600 MG tablet TAKE 1 TABLET BY MOUTH THREE TIMES DAILY GENERIC EQUIVALENT FOR NEURONTIN 270 tablet 1  . ibuprofen (ADVIL,MOTRIN) 200 MG tablet Take 800 mg by mouth 4 (four) times daily as needed (for pain.).    Marland Kitchen levothyroxine (SYNTHROID, LEVOTHROID) 75 MCG tablet TAKE 1 TABLET BY MOUTH DAILY GENERIC EQUIVALENT FOR SYNTHROID 90 tablet 1  . LINZESS 145 MCG CAPS capsule TAKE ONE CAPSULE BY MOUTH DAILY BEFORE BREAKFAST 90 capsule 3  . meclizine (ANTIVERT) 12.5 MG tablet Take 1 tablet (12.5 mg total) by mouth 3 (three) times daily as needed for dizziness. 30 tablet 0  . Multiple Vitamins-Minerals (PRESERVISION AREDS 2 PO) Take 1 capsule by mouth daily.    Marland Kitchen nystatin-triamcinolone (MYCOLOG II) cream APPLY TO CORNERS OF THE MOUTH TWICE A DAY AS NEEDED FOR CRACKING/BURNING  2  . omeprazole (PRILOSEC) 40 MG capsule TAKE ONE CAPSULE BY MOUTH DAILY. NEED APPOINTMENT FOR FURTHER REFILLS 90 capsule 1  . oxyCODONE-acetaminophen (PERCOCET/ROXICET) 5-325 MG tablet Take 1-2 tablets by mouth every 4 (four) hours as needed for severe pain. 30 tablet 0  . rosuvastatin (CRESTOR) 20 MG tablet Take 1 tablet (20 mg total) by mouth daily. 30 tablet 2  .  traMADol (ULTRAM) 50 MG tablet Take 1 tablet (50 mg total) by mouth every 6 (six) hours as needed. 30 tablet 0  . vitamin B-12 1000 MCG tablet Take 1 tablet (1,000 mcg total) by mouth daily. 30 tablet 0  . ondansetron (ZOFRAN) 4 MG tablet Take 1 tablet (4 mg total) by mouth every 8 (eight) hours as needed for nausea or vomiting. 20 tablet 0   No current facility-administered medications for this visit.      PHYSICAL EXAM: Vitals:   09/05/17 1054  BP:  104/74  Pulse: (!) 105  Resp: 20  Temp: 97.9 F (36.6 C)  TempSrc: Oral  SpO2: 98%  Weight: 96 lb (43.5 kg)  Height: 5\' 3"  (1.6 m)    GENERAL: The patient is a well-nourished female, in no acute distress. The vital signs are documented above. Abdominal incision and bilateral groin incisions and vein harvest incisions are all healing quite nicely.  She has easily palpable popliteal pulses bilaterally.  MEDICAL ISSUES: Stable postop aortobifem and bilateral femoral-popliteal bypasses.  She will continue her walking program as much as possible.  Will see [place] again in 2 months for continued follow-up   Rosetta Posner, MD [facility] Tel [phone] Pager [phone]

## 2017-09-20 ENCOUNTER — Encounter: Payer: BLUE CROSS/BLUE SHIELD | Admitting: Vascular Surgery

## 2017-09-20 NOTE — Addendum Note (Signed)
Addended by: Lianne Cure A on: 09/20/2017 01:17 PM   Modules accepted: Orders

## 2017-09-25 ENCOUNTER — Telehealth: Payer: Self-pay | Admitting: *Deleted

## 2017-09-26 ENCOUNTER — Other Ambulatory Visit: Payer: Self-pay | Admitting: Family Medicine

## 2017-10-02 ENCOUNTER — Other Ambulatory Visit: Payer: Self-pay | Admitting: Family Medicine

## 2017-10-02 ENCOUNTER — Other Ambulatory Visit: Payer: Self-pay | Admitting: Vascular Surgery

## 2017-10-02 DIAGNOSIS — R112 Nausea with vomiting, unspecified: Secondary | ICD-10-CM

## 2017-10-04 NOTE — Telephone Encounter (Signed)
Pt states she wanted to tell Dr. Paulla Dolly how much she appreciated him checking her circulation, that she may have lost a limb or died if she had not been evaluated, and she had 3 major surgeries in 13 1/2 hours.

## 2017-10-08 ENCOUNTER — Inpatient Hospital Stay (HOSPITAL_COMMUNITY)
Admission: EM | Admit: 2017-10-08 | Discharge: 2017-10-16 | DRG: 246 | Disposition: A | Discharge status: Home / Self Care | Payer: BLUE CROSS/BLUE SHIELD | Attending: Family Medicine | Admitting: Family Medicine

## 2017-10-08 ENCOUNTER — Encounter (HOSPITAL_COMMUNITY): Payer: Self-pay

## 2017-10-08 DIAGNOSIS — I25118 Atherosclerotic heart disease of native coronary artery with other forms of angina pectoris: Secondary | ICD-10-CM | POA: Diagnosis not present

## 2017-10-08 DIAGNOSIS — G8929 Other chronic pain: Secondary | ICD-10-CM | POA: Diagnosis present

## 2017-10-08 DIAGNOSIS — Z955 Presence of coronary angioplasty implant and graft: Secondary | ICD-10-CM | POA: Diagnosis not present

## 2017-10-08 DIAGNOSIS — I2510 Atherosclerotic heart disease of native coronary artery without angina pectoris: Secondary | ICD-10-CM | POA: Diagnosis present

## 2017-10-08 DIAGNOSIS — E119 Type 2 diabetes mellitus without complications: Secondary | ICD-10-CM | POA: Diagnosis not present

## 2017-10-08 DIAGNOSIS — E43 Unspecified severe protein-calorie malnutrition: Secondary | ICD-10-CM | POA: Diagnosis present

## 2017-10-08 DIAGNOSIS — I4581 Long QT syndrome: Secondary | ICD-10-CM | POA: Diagnosis not present

## 2017-10-08 DIAGNOSIS — Z79899 Other long term (current) drug therapy: Secondary | ICD-10-CM | POA: Diagnosis not present

## 2017-10-08 DIAGNOSIS — I200 Unstable angina: Secondary | ICD-10-CM

## 2017-10-08 DIAGNOSIS — I5181 Takotsubo syndrome: Secondary | ICD-10-CM | POA: Diagnosis present

## 2017-10-08 DIAGNOSIS — F419 Anxiety disorder, unspecified: Secondary | ICD-10-CM | POA: Diagnosis present

## 2017-10-08 DIAGNOSIS — E039 Hypothyroidism, unspecified: Secondary | ICD-10-CM | POA: Diagnosis present

## 2017-10-08 DIAGNOSIS — Z96649 Presence of unspecified artificial hip joint: Secondary | ICD-10-CM | POA: Diagnosis present

## 2017-10-08 DIAGNOSIS — E872 Acidosis: Secondary | ICD-10-CM | POA: Diagnosis present

## 2017-10-08 DIAGNOSIS — K589 Irritable bowel syndrome without diarrhea: Secondary | ICD-10-CM | POA: Diagnosis present

## 2017-10-08 DIAGNOSIS — I5020 Unspecified systolic (congestive) heart failure: Secondary | ICD-10-CM | POA: Diagnosis present

## 2017-10-08 DIAGNOSIS — Z7982 Long term (current) use of aspirin: Secondary | ICD-10-CM | POA: Diagnosis not present

## 2017-10-08 DIAGNOSIS — R252 Cramp and spasm: Secondary | ICD-10-CM

## 2017-10-08 DIAGNOSIS — E876 Hypokalemia: Secondary | ICD-10-CM | POA: Diagnosis present

## 2017-10-08 DIAGNOSIS — I5021 Acute systolic (congestive) heart failure: Secondary | ICD-10-CM | POA: Diagnosis not present

## 2017-10-08 DIAGNOSIS — C210 Malignant neoplasm of anus, unspecified: Secondary | ICD-10-CM | POA: Diagnosis present

## 2017-10-08 DIAGNOSIS — Z85048 Personal history of other malignant neoplasm of rectum, rectosigmoid junction, and anus: Secondary | ICD-10-CM | POA: Diagnosis not present

## 2017-10-08 DIAGNOSIS — I25110 Atherosclerotic heart disease of native coronary artery with unstable angina pectoris: Secondary | ICD-10-CM | POA: Diagnosis present

## 2017-10-08 DIAGNOSIS — E785 Hyperlipidemia, unspecified: Secondary | ICD-10-CM | POA: Diagnosis present

## 2017-10-08 DIAGNOSIS — R519 Headache, unspecified: Secondary | ICD-10-CM | POA: Diagnosis present

## 2017-10-08 DIAGNOSIS — Z87891 Personal history of nicotine dependence: Secondary | ICD-10-CM | POA: Diagnosis not present

## 2017-10-08 DIAGNOSIS — R531 Weakness: Secondary | ICD-10-CM | POA: Diagnosis present

## 2017-10-08 DIAGNOSIS — K219 Gastro-esophageal reflux disease without esophagitis: Secondary | ICD-10-CM | POA: Diagnosis present

## 2017-10-08 DIAGNOSIS — F329 Major depressive disorder, single episode, unspecified: Secondary | ICD-10-CM | POA: Diagnosis present

## 2017-10-08 DIAGNOSIS — R7309 Other abnormal glucose: Secondary | ICD-10-CM

## 2017-10-08 DIAGNOSIS — Z923 Personal history of irradiation: Secondary | ICD-10-CM

## 2017-10-08 DIAGNOSIS — E1151 Type 2 diabetes mellitus with diabetic peripheral angiopathy without gangrene: Secondary | ICD-10-CM | POA: Diagnosis present

## 2017-10-08 DIAGNOSIS — R000 Tachycardia, unspecified: Secondary | ICD-10-CM | POA: Diagnosis not present

## 2017-10-08 DIAGNOSIS — Z681 Body mass index (BMI) 19 or less, adult: Secondary | ICD-10-CM

## 2017-10-08 DIAGNOSIS — R51 Headache: Secondary | ICD-10-CM

## 2017-10-08 DIAGNOSIS — R9431 Abnormal electrocardiogram [ECG] [EKG]: Secondary | ICD-10-CM | POA: Diagnosis not present

## 2017-10-08 DIAGNOSIS — E7849 Other hyperlipidemia: Secondary | ICD-10-CM | POA: Diagnosis not present

## 2017-10-08 HISTORY — DX: Cramp and spasm: R25.2

## 2017-10-08 HISTORY — DX: Abnormal electrocardiogram (ECG) (EKG): R94.31

## 2017-10-08 LAB — CBC
HCT: 39.6 % (ref 36.0–46.0)
HEMOGLOBIN: 13.1 g/dL (ref 12.0–15.0)
MCH: 29 pg (ref 26.0–34.0)
MCHC: 33.1 g/dL (ref 30.0–36.0)
MCV: 87.8 fL (ref 78.0–100.0)
Platelets: 266 10*3/uL (ref 150–400)
RBC: 4.51 MIL/uL (ref 3.87–5.11)
RDW: 14.3 % (ref 11.5–15.5)
WBC: 10.9 10*3/uL — AB (ref 4.0–10.5)

## 2017-10-08 LAB — I-STAT TROPONIN, ED: Troponin i, poc: 0.01 ng/mL (ref 0.00–0.08)

## 2017-10-08 LAB — MAGNESIUM
MAGNESIUM: 1.4 mg/dL — AB (ref 1.7–2.4)
Magnesium: 2.1 mg/dL (ref 1.7–2.4)

## 2017-10-08 LAB — I-STAT CG4 LACTIC ACID, ED: LACTIC ACID, VENOUS: 2.25 mmol/L — AB (ref 0.5–1.9)

## 2017-10-08 LAB — PHOSPHORUS: PHOSPHORUS: 2.2 mg/dL — AB (ref 2.5–4.6)

## 2017-10-08 MED ORDER — ONDANSETRON HCL 4 MG PO TABS: 4.0000 mg | ORAL_TABLET | Freq: Four times a day (QID) | ORAL | Status: DC | PRN

## 2017-10-08 MED ORDER — ASPIRIN EC 81 MG PO TBEC
81.0000 mg | DELAYED_RELEASE_TABLET | Freq: Every day | ORAL | Status: DC
  Administered 2017-10-09 – 2017-10-16 (×8): 81 mg via ORAL
  Filled 2017-10-08 (×8): qty 1

## 2017-10-08 MED ORDER — SODIUM CHLORIDE 0.9 % IV BOLUS (SEPSIS)
500.0000 mL | Freq: Once | INTRAVENOUS | Status: AC
  Administered 2017-10-08: 500 mL via INTRAVENOUS

## 2017-10-08 MED ORDER — ROSUVASTATIN CALCIUM 20 MG PO TABS
20.0000 mg | ORAL_TABLET | Freq: Every day | ORAL | Status: DC
  Administered 2017-10-09 – 2017-10-16 (×8): 20 mg via ORAL
  Filled 2017-10-08 (×8): qty 1

## 2017-10-08 MED ORDER — POTASSIUM CHLORIDE 10 MEQ/100ML IV SOLN
10.0000 meq | Freq: Once | INTRAVENOUS | Status: AC
  Administered 2017-10-08: 10 meq via INTRAVENOUS
  Filled 2017-10-08: qty 100

## 2017-10-08 MED ORDER — ACETAMINOPHEN 325 MG PO TABS
650.0000 mg | ORAL_TABLET | Freq: Once | ORAL | Status: AC
  Administered 2017-10-08: 650 mg via ORAL
  Filled 2017-10-08: qty 2

## 2017-10-08 MED ORDER — ACETAMINOPHEN 325 MG PO TABS
650.0000 mg | ORAL_TABLET | Freq: Four times a day (QID) | ORAL | Status: DC | PRN
  Administered 2017-10-09 – 2017-10-16 (×10): 650 mg via ORAL
  Filled 2017-10-08 (×10): qty 2

## 2017-10-08 MED ORDER — POTASSIUM CHLORIDE 10 MEQ/100ML IV SOLN
10.0000 meq | Freq: Once | INTRAVENOUS | Status: DC
  Filled 2017-10-08: qty 100

## 2017-10-08 MED ORDER — POTASSIUM CHLORIDE CRYS ER 20 MEQ PO TBCR
40.0000 meq | EXTENDED_RELEASE_TABLET | Freq: Once | ORAL | Status: AC
  Administered 2017-10-08: 40 meq via ORAL
  Filled 2017-10-08: qty 2

## 2017-10-08 MED ORDER — OXYCODONE-ACETAMINOPHEN 5-325 MG PO TABS
1.0000 | ORAL_TABLET | ORAL | Status: DC | PRN
  Administered 2017-10-13 – 2017-10-14 (×2): 1 via ORAL
  Administered 2017-10-14: 11:00:00 2 via ORAL
  Filled 2017-10-08: qty 1
  Filled 2017-10-08: qty 2
  Filled 2017-10-08: qty 1

## 2017-10-08 MED ORDER — POTASSIUM CHLORIDE IN NACL 40-0.9 MEQ/L-% IV SOLN
INTRAVENOUS | Status: AC
  Administered 2017-10-09 (×2): 75 mL/h via INTRAVENOUS
  Filled 2017-10-08: qty 1000

## 2017-10-08 MED ORDER — BIOTIN 10000 MCG PO TABS: 10000.0000 ug | ORAL_TABLET | Freq: Every day | ORAL | Status: DC

## 2017-10-08 MED ORDER — POTASSIUM PHOSPHATES 15 MMOLE/5ML IV SOLN
30.0000 mmol | Freq: Once | INTRAVENOUS | Status: AC
  Administered 2017-10-08: 30 mmol via INTRAVENOUS
  Filled 2017-10-08: qty 10

## 2017-10-08 MED ORDER — IOPAMIDOL (ISOVUE-370) INJECTION 76%
INTRAVENOUS | Status: AC
  Filled 2017-10-08: qty 100

## 2017-10-08 MED ORDER — ONDANSETRON HCL 4 MG/2ML IJ SOLN
4.0000 mg | Freq: Four times a day (QID) | INTRAMUSCULAR | Status: DC | PRN
  Administered 2017-10-09: 4 mg via INTRAVENOUS
  Filled 2017-10-08: qty 2

## 2017-10-08 MED ORDER — VITAMIN B-12 1000 MCG PO TABS
1000.0000 ug | ORAL_TABLET | Freq: Every day | ORAL | Status: DC
  Administered 2017-10-09 – 2017-10-16 (×8): 1000 ug via ORAL
  Filled 2017-10-08 (×8): qty 1

## 2017-10-08 MED ORDER — MAGNESIUM SULFATE 2 GM/50ML IV SOLN
2.0000 g | Freq: Once | INTRAVENOUS | Status: AC
  Administered 2017-10-08: 2 g via INTRAVENOUS
  Filled 2017-10-08: qty 50

## 2017-10-08 MED ORDER — ACETAMINOPHEN 650 MG RE SUPP: 650.0000 mg | Freq: Four times a day (QID) | RECTAL | Status: DC | PRN

## 2017-10-08 MED ORDER — IBUPROFEN 200 MG PO TABS: 800.0000 mg | ORAL_TABLET | Freq: Four times a day (QID) | ORAL | Status: DC | PRN

## 2017-10-08 NOTE — ED Provider Notes (Signed)
[facility] EMERGENCY DEPARTMENT Provider Note   CSN: [id number] Arrival date & time: 10/08/17  1722     History   Chief Complaint Chief Complaint  Patient presents with  . Spasms    HPI Erin [name] is a 61 y.o. female.  HPI   Patient is a 61 year old female presenting with cramping.  Patient had complex last hospitalization 2 months ago after aortobifemoral bypass on 08/14/2017.  Had multiple hospitalizations for low potassium but was taken off potassium several months ago, never had a rechecked and never started taking it again. PT reports increasing spasms for the last several days. Patient states she has pain in her chest and abdomen and adnexal area ever since she had surgery.  No different changes right now. . Past Medical History:  Diagnosis Date  . Allergy   . Alopecia   . 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 Anxiety   . B12 deficiency anemia 09/14/2015  . Blood transfusion without reported diagnosis   . Chronic back pain   . Chronic daily headache 03/29/2013   takes bc powder  . Closed right hip fracture ([place]) 09/10/2015  . Depression   . DM (diabetes mellitus) ([place])    diet controlled; does not check BG  . GERD (gastroesophageal reflux disease)   . History of hiatal hernia   . HLD (hyperlipidemia)   . Hot flashes   . Hyperlipemia 04/11/2014  . Hypokalemia 05/2016  . Hypothyroidism 09/10/2015  . IBS (irritable bowel syndrome) 03/29/2013  . 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 Tubular adenoma of colon 09/08/2003  . Vertigo   . Wears glasses     Patient Active Problem List   Diagnosis Date Noted  . 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 Hyperlipidemia associated with type 2 diabetes mellitus ([place]) 05/11/2017  . Syncope 06/07/2016  . Elevated transaminase level 06/07/2016  . Hypokalemia 06/06/2016  . Family history of early CAD 04/08/2016  .  Type 2 diabetes mellitus without complication, without long-term current use of insulin ([place]) 03/18/2016  . B12 deficiency anemia 09/14/2015  . Hypothyroidism 09/10/2015  . GERD without esophagitis 09/10/2015  . BPPV (benign paroxysmal positional vertigo) 07/08/2015  . Gait instability 07/08/2015  . Neck pain 07/08/2015  . Nausea & vomiting 06/27/2015  . Anal cancer ([place]) 08/19/2013  . IBS (irritable bowel syndrome) 03/29/2013  . Chronic daily headache 03/29/2013  . 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 ABDOMINAL AORTOGRAM N/A 08/02/2017   Performed by Wellington Hampshire, MD at [facility]  . AORTA BIFEMORAL BYPASS GRAFT N/A 08/14/2017   Performed by Rosetta Posner, MD at [facility]    . dental implant    . ECTOPIC PREGNANCY SURGERY    . EMBOLECTOMY FEMORAL N/A 08/14/2017   Performed by Rosetta Posner, MD at [facility]  . FLEXIBLE SIGMOIDOSCOPY N/A 08/14/2013   Performed by Ladene Artist, MD at [facility]  . Lower Extremity Angiography Bilateral 08/02/2017   Performed by Wellington Hampshire, MD at [facility]  . MULTIPLE TOOTH EXTRACTIONS    . PILONIDAL CYST EXCISION    . POLYPECTOMY    . Right Femoral to Above Knee Popliteal Bypass Graft using Non-Reversed Greater Saphenous Vein Graft from Right Leg Right 08/14/2017   Performed by Rosetta Posner, MD at [facility]  .  THROMBECTOMY FEMORAL ARTERY Right 08/14/2017   Performed by Rosetta Posner, MD at [facility]  . TONSILLECTOMY    . TOTAL HIP ARTHROPLASTY  09/11/2015   Performed by Renette Butters, MD at [facility]    OB History    No data available       Home Medications    Prior to Admission medications   Medication Sig Start Date End Date Taking? Authorizing Provider  acyclovir ointment (ZOVIRAX) 5 % Apply 1 application topically every 3 (three) hours. Patient taking differently: Apply 1 application topically every 3 (three) hours as needed (for fever blister).  08/18/16    Lucretia Kern, DO  aspirin EC 81 MG tablet Take 81 mg by mouth daily.    [provider]  Biotin 10000 MCG TABS Take 10,000 mcg by mouth daily.    [provider]  dicyclomine (BENTYL) 10 MG capsule TAKE ONE CAPSULE BY MOUTH THREE TIMES DAILY BEFORE MEALS. DUE FOR FOLLOW UP IN MAY, NEEDS TO SCHEDULE AN APPOINTMENT Patient taking differently: TAKE ONE CAPSULE BY MOUTH THREE TIMES DAILY BEFORE MEALS 07/03/17   Ladene Artist, MD  fluticasone (FLONASE) 50 MCG/ACT nasal spray USE 1 SPRAY IN EACH NOSTRIL DAILY Patient taking differently: USE 1 SPRAY IN [facility] NOSTRIL DAILY AT BEDTMIE 01/17/17   Lucretia Kern, DO  gabapentin (NEURONTIN) 600 MG tablet TAKE 1 TABLET BY MOUTH THREE TIMES DAILY GENERIC EQUIVALENT FOR NEURONTIN 09/26/17   Lucretia Kern, DO  ibuprofen (ADVIL,MOTRIN) 200 MG tablet Take 800 mg by mouth 4 (four) times daily as needed (for pain.).    [provider]  levothyroxine (SYNTHROID, LEVOTHROID) 75 MCG tablet TAKE 1 TABLET BY MOUTH DAILY. GENERIC EQUIVALENT FOR SYNTHROID 09/26/17   Lucretia Kern, DO  LINZESS 145 MCG CAPS capsule TAKE ONE CAPSULE BY MOUTH DAILY BEFORE BREAKFAST 07/18/17   Ladene Artist, MD  meclizine (ANTIVERT) 12.5 MG tablet TAKE 1 TABLET (12.5 MG TOTAL) BY MOUTH 3 (THREE) TIMES DAILY AS NEEDED FOR DIZZINESS. 10/02/17   Lucretia Kern, DO  Multiple Vitamins-Minerals (PRESERVISION AREDS 2 PO) Take 1 capsule by mouth daily.    [provider]  nystatin-triamcinolone (MYCOLOG II) cream APPLY TO CORNERS OF THE MOUTH TWICE A DAY AS NEEDED FOR CRACKING/BURNING 03/30/16   [provider]  omeprazole (PRILOSEC) 40 MG capsule TAKE ONE CAPSULE BY MOUTH DAILY. NEED APPOINTMENT FOR FURTHER REFILLS 07/03/17   Ladene Artist, MD  ondansetron (ZOFRAN) 4 MG tablet TAKE 1 TABLET BY MOUTH EVERY 8 HOURS AS NEEDED FOR NAUSEA AND VOMITING 10/02/17   Waynetta Sandy, MD  oxyCODONE-acetaminophen (PERCOCET/ROXICET) 5-325 MG tablet Take 1-2  tablets by mouth every 4 (four) hours as needed for severe pain. 08/20/17   Ulyses Amor, PA-C  rosuvastatin (CRESTOR) 20 MG tablet Take 1 tablet (20 mg total) by mouth daily. 07/18/17 10/16/17  Wellington Hampshire, MD  traMADol (ULTRAM) 50 MG tablet Take 1 tablet (50 mg total) by mouth every 6 (six) hours as needed. 08/30/17   Elam Dutch, MD  vitamin B-12 1000 MCG tablet Take 1 tablet (1,000 mcg total) by mouth daily. 09/14/15   Louellen Molder, MD    Family History Family History  Problem Relation Age of Onset  . Arthritis Mother   . Hyperlipidemia Mother   . Heart disease Mother   . Hypertension Mother   . Stroke Mother 84  . Irritable bowel syndrome Mother   . Thyroid disease Mother   .  Heart attack Mother   . Heart disease Father   . Hyperlipidemia Father   . Hypertension Father   . Stroke Father 20  . Thyroid disease Father   . Prostate cancer Father   . Heart attack Father   . Lung cancer Brother   . Heart attack Maternal Grandfather   . Heart attack Maternal Uncle   . Colon cancer Neg Hx   . Rectal cancer Neg Hx   . Stomach cancer Neg Hx     Social History Social History   Tobacco Use  . Smoking status: Former Smoker    Packs/day: 0.50    Types: Cigarettes    Last attempt to quit: 10/07/2014    Years since quitting: 3.0  . Smokeless tobacco: Never Used  Substance Use Topics  . Alcohol use: No    Alcohol/week: 0.0 oz  . Drug use: No     Allergies   Amitiza [lubiprostone]; Nortriptyline; Sulfa antibiotics; Atorvastatin; Claritin [loratadine]; Dexamethasone; and Prednisone   Review of Systems Review of Systems  Constitutional: Positive for fatigue. Negative for activity change.  Respiratory: Negative for shortness of breath.   Cardiovascular: Negative for chest pain.  Gastrointestinal: Negative for abdominal pain.     Physical Exam Updated Vital Signs BP 123/90   Pulse (!) 132   Temp 98.2 F (36.8 C) (Oral)   Resp (!) 21   Ht 5\' 3"  (1.6  m)   Wt 40.8 kg (90 lb)   SpO2 100%   BMI 15.94 kg/m   Physical Exam  Constitutional: She is oriented to person, place, and time. She appears well-developed and well-nourished.  HENT:  Head: Normocephalic and atraumatic.  Eyes: Right eye exhibits no discharge. Left eye exhibits no discharge.  Cardiovascular: Normal rate, regular rhythm and normal heart sounds.  No murmur heard. Pulmonary/Chest: Effort normal and breath sounds normal. She has no wheezes. She has no rales.  Abdominal: Soft. She exhibits no distension. There is no tenderness.  Scar midline.  Musculoskeletal:  Carpal spasms  Neurological: She is oriented to person, place, and time.  Skin: Skin is warm and dry. She is not diaphoretic.  Psychiatric: She has a normal mood and affect.  amxious  Nursing note and vitals reviewed.    ED Treatments / Results  Labs (all labs ordered are listed, but only abnormal results are displayed) Labs Reviewed  BASIC METABOLIC PANEL - Abnormal; Notable for the following components:      Result Value   Potassium <2.0 (*)    Chloride 90 (*)    Glucose, Bld 129 (*)    BUN <5 (*)    Calcium 8.3 (*)    GFR calc non Af Amer 60 (*)    Anion gap 16 (*)    All other components within normal limits  CBC - Abnormal; Notable for the following components:   WBC 10.9 (*)    All other components within normal limits    EKG  EKG Interpretation None       Radiology No results found.  Procedures Procedures (including critical care time)  CRITICAL CARE Performed by: Gardiner Sleeper Total critical care time: 60  minutes Critical care time was exclusive of separately billable procedures and treating other patients. Critical care was necessary to treat or prevent imminent or life-threatening deterioration. Critical care was time spent personally by me on the following activities: development of treatment plan with patient and/or surrogate as well as nursing, discussions with  consultants, evaluation of patient's response  to treatment, examination of patient, obtaining history from patient or surrogate, ordering and performing treatments and interventions, ordering and review of laboratory studies, ordering and review of radiographic studies, pulse oximetry and re-evaluation of patient's condition.   Medications Ordered in ED Medications  potassium chloride 10 mEq in 100 mL IVPB (not administered)  potassium chloride 10 mEq in 100 mL IVPB (not administered)  potassium chloride SA (K-DUR,KLOR-CON) CR tablet 40 mEq (not administered)     Initial Impression / Assessment and Plan / ED Course  I have reviewed the triage vital signs and the nursing notes.  Pertinent labs & imaging results that were available during my care of the patient were reviewed by me and considered in my medical decision making (see chart for details).     Patient is a 61 year old female presenting with cramping.  Patient had complex last hospitalization 2 months ago after aortobifemoral bypass on 08/14/2017.  Had multiple hospitalizations for low potassium but was taken off potassium several months ago, never had a rechecked and never started taking it again. PT reports increasing spasms for the last several days. Patient states she has pain in her chest and abdomen and adnexal area ever since she had surgery.  No different changes right now. . 7:12 PM Patient has potassium less than 2.  Will start with 10 run per hour.  Additionally oral load.  Given how low it is, will require admission for hypokalemia. Will replace Mag as well.    8:01 PM Patient complaining of chest heaviness, chest pain.  Abdominal pain.  Patient had some low blood pressure readings.  Can give her 500 fluid.  Do not want to give her too much fluids given that this will drive down her potassium even further.  8:45 PM Initially considered doing CT angio given pain,  however after patient calmed down all of her pain went  away.  Patient now resting comfortably.  Discussed with pharmacy to see about potassium and phosphorus and magnesium repletion.  We will run K-Phos and then addition start her on some oral potassium.  She says she took 2 pills prior to arrival.  We will give her 40 mEq more here in some applesauce as requested.    Final Clinical Impressions(s) / ED Diagnoses   Final diagnoses:  None    ED Discharge Orders    None       Macarthur Critchley, MD 10/08/17 [phone]

## 2017-10-08 NOTE — H&P (Signed)
History and Physical    Erin [name] NLG:[id number] DOB: 06-30-1956 DOA: 10/08/2017  PCP: Lucretia Kern, DO  Patient coming from:  home  Chief Complaint: spasms, weak  HPI: Erin [name] is a 61 y.o. female with medical history significant of recent fempop surgery 2 months ago, anal cancer in remission 4 years ago, electrolyte abnormalities, depression, chronic pain, poor appetitite comes in with 4 days of worsening muscle spasms especiaally in her wrists along with feeling very weak.  She denies any fevers.  Her and her husband report she has no appetitie and has not been eating well.  Everything tastes awful.  No n/v/d.  She has h/o hypokalemia and had been on potassium pills up til her fempop surgery 2 months ago at which time it was stopped at her discharge.  She has not had any follow up since.  She denies any kidney issues or any congenital diseases.  No family h/o kidney problems.  Pt referred for admission for severe electrolyte abnormalities.  Review of Systems: As per HPI otherwise 10 point review of systems negative.   Past Medical History:  Diagnosis Date  . Allergy   . Alopecia   . 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 Anxiety   . B12 deficiency anemia 09/14/2015  . Blood transfusion without reported diagnosis   . Chronic back pain   . Chronic daily headache 03/29/2013   takes bc powder  . Closed right hip fracture ([place]) 09/10/2015  . Depression   . DM (diabetes mellitus) ([place])    diet controlled; does not check BG  . GERD (gastroesophageal reflux disease)   . History of hiatal hernia   . HLD (hyperlipidemia)   . Hot flashes   . Hyperlipemia 04/11/2014  . Hypokalemia 05/2016  . Hypothyroidism 09/10/2015  . IBS (irritable bowel syndrome) 03/29/2013  . 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 Tubular adenoma of colon 09/08/2003  . Vertigo   . Wears glasses     Past Surgical History:    Procedure Laterality Date  . ABDOMINAL AORTOGRAM N/A 08/02/2017   Performed by Wellington Hampshire, MD at [facility]  . AORTA BIFEMORAL BYPASS GRAFT N/A 08/14/2017   Performed by Rosetta Posner, MD at [facility]    . dental implant    . ECTOPIC PREGNANCY SURGERY    . EMBOLECTOMY FEMORAL N/A 08/14/2017   Performed by Rosetta Posner, MD at [facility]  . FLEXIBLE SIGMOIDOSCOPY N/A 08/14/2013   Performed by Ladene Artist, MD at [facility]  . Lower Extremity Angiography Bilateral 08/02/2017   Performed by Wellington Hampshire, MD at [facility]  . MULTIPLE TOOTH EXTRACTIONS    . PILONIDAL CYST EXCISION    . POLYPECTOMY    . Right Femoral to Above Knee Popliteal Bypass Graft using Non-Reversed Greater Saphenous Vein Graft from Right Leg Right 08/14/2017   Performed by Rosetta Posner, MD at [facility]  . THROMBECTOMY FEMORAL ARTERY Right 08/14/2017   Performed by Rosetta Posner, MD at [facility]  . TONSILLECTOMY    . TOTAL HIP ARTHROPLASTY  09/11/2015   Performed by Renette Butters, MD at [facility]     reports that she quit smoking about 3 years ago. Her smoking use included cigarettes. She smoked 0.50 packs per day. she has never used smokeless tobacco. She reports that she does  not drink alcohol or use drugs.  Allergies  Allergen Reactions  . Amitiza [Lubiprostone] Diarrhea    Uncontrollable diarrhea  . Nortriptyline Other (See Comments)    Stomach distention  . Sulfa Antibiotics Nausea And Vomiting and Other (See Comments)    GI DISTRESS PAIN  . Atorvastatin Nausea And Vomiting  . Claritin [Loratadine] Other (See Comments)    Hot flashes  . Dexamethasone Itching    Itching  . Prednisone Rash    Family History  Problem Relation Age of Onset  . Arthritis Mother   . Hyperlipidemia Mother   . Heart disease Mother   . Hypertension Mother   . Stroke Mother 8  . Irritable bowel syndrome Mother   . Thyroid disease Mother   . Heart attack Mother   . Heart disease Father   .  Hyperlipidemia Father   . Hypertension Father   . Stroke Father 42  . Thyroid disease Father   . Prostate cancer Father   . Heart attack Father   . Lung cancer Brother   . Heart attack Maternal Grandfather   . Heart attack Maternal Uncle   . Colon cancer Neg Hx   . Rectal cancer Neg Hx   . Stomach cancer Neg Hx     Prior to Admission medications   Medication Sig Start Date End Date Taking? Authorizing Provider  acyclovir ointment (ZOVIRAX) 5 % Apply 1 application topically every 3 (three) hours. Patient taking differently: Apply 1 application topically every 3 (three) hours as needed (for fever blister).  08/18/16   Lucretia Kern, DO  aspirin EC 81 MG tablet Take 81 mg by mouth daily.    [provider]  Biotin 10000 MCG TABS Take 10,000 mcg by mouth daily.    [provider]  dicyclomine (BENTYL) 10 MG capsule TAKE ONE CAPSULE BY MOUTH THREE TIMES DAILY BEFORE MEALS. DUE FOR FOLLOW UP IN MAY, NEEDS TO SCHEDULE AN APPOINTMENT Patient taking differently: TAKE ONE CAPSULE BY MOUTH THREE TIMES DAILY BEFORE MEALS 07/03/17   Ladene Artist, MD  fluticasone (FLONASE) 50 MCG/ACT nasal spray USE 1 SPRAY IN EACH NOSTRIL DAILY Patient taking differently: USE 1 SPRAY IN [facility] NOSTRIL DAILY AT BEDTMIE 01/17/17   Lucretia Kern, DO  gabapentin (NEURONTIN) 600 MG tablet TAKE 1 TABLET BY MOUTH THREE TIMES DAILY GENERIC EQUIVALENT FOR NEURONTIN 09/26/17   Lucretia Kern, DO  ibuprofen (ADVIL,MOTRIN) 200 MG tablet Take 800 mg by mouth 4 (four) times daily as needed (for pain.).    [provider]  levothyroxine (SYNTHROID, LEVOTHROID) 75 MCG tablet TAKE 1 TABLET BY MOUTH DAILY. GENERIC EQUIVALENT FOR SYNTHROID 09/26/17   Lucretia Kern, DO  LINZESS 145 MCG CAPS capsule TAKE ONE CAPSULE BY MOUTH DAILY BEFORE BREAKFAST 07/18/17   Ladene Artist, MD  meclizine (ANTIVERT) 12.5 MG tablet TAKE 1 TABLET (12.5 MG TOTAL) BY MOUTH 3 (THREE) TIMES DAILY AS NEEDED FOR DIZZINESS. 10/02/17   Lucretia Kern, DO  Multiple Vitamins-Minerals (PRESERVISION AREDS 2 PO) Take 1 capsule by mouth daily.    [provider]  nystatin-triamcinolone (MYCOLOG II) cream APPLY TO CORNERS OF THE MOUTH TWICE A DAY AS NEEDED FOR CRACKING/BURNING 03/30/16   [provider]  omeprazole (PRILOSEC) 40 MG capsule TAKE ONE CAPSULE BY MOUTH DAILY. NEED APPOINTMENT FOR FURTHER REFILLS 07/03/17   Ladene Artist, MD  ondansetron (ZOFRAN) 4 MG tablet TAKE 1 TABLET BY MOUTH EVERY 8 HOURS AS NEEDED FOR NAUSEA AND VOMITING 10/02/17  Waynetta Sandy, MD  oxyCODONE-acetaminophen (PERCOCET/ROXICET) 5-325 MG tablet Take 1-2 tablets by mouth every 4 (four) hours as needed for severe pain. 08/20/17   Ulyses Amor, PA-C  rosuvastatin (CRESTOR) 20 MG tablet Take 1 tablet (20 mg total) by mouth daily. 07/18/17 10/16/17  Wellington Hampshire, MD  traMADol (ULTRAM) 50 MG tablet Take 1 tablet (50 mg total) by mouth every 6 (six) hours as needed. 08/30/17   Elam Dutch, MD  vitamin B-12 1000 MCG tablet Take 1 tablet (1,000 mcg total) by mouth daily. 09/14/15   Louellen Molder, MD    Physical Exam: Vitals:   10/08/17 2015 10/08/17 2020 10/08/17 2045 10/08/17 2100  BP: 139/77 129/88 119/89 (!) 167/65  Pulse: 95 91 85 (!) 48  Resp: 19 (!) 24 19 20   Temp:      TempSrc:      SpO2: 95% 98% 100% 100%  Weight:      Height:          Constitutional: NAD, calm, comfortable Vitals:   10/08/17 2015 10/08/17 2020 10/08/17 2045 10/08/17 2100  BP: 139/77 129/88 119/89 (!) 167/65  Pulse: 95 91 85 (!) 48  Resp: 19 (!) 24 19 20   Temp:      TempSrc:      SpO2: 95% 98% 100% 100%  Weight:      Height:       Eyes: PERRL, lids and conjunctivae normal ENMT: Mucous membranes are dry. Posterior pharynx clear of any exudate or lesions.Normal dentition.  Neck: normal, supple, no masses, no thyromegaly Respiratory: clear to auscultation bilaterally, no wheezing, no crackles. Normal respiratory effort. No  accessory muscle use.  Cardiovascular: Regular rate and rhythm, no murmurs / rubs / gallops. No extremity edema. 2+ pedal pulses. No carotid bruits.  Abdomen: no tenderness, no masses palpated. No hepatosplenomegaly. Bowel sounds positive.  Musculoskeletal: no clubbing / cyanosis. No joint deformity upper and lower extremities. Good ROM, no contractures. Normal muscle tone.  Skin: no rashes, lesions, ulcers. No induration Neurologic: CN 2-12 grossly intact. Sensation intact, DTR normal. Strength 5/5 in all 4.  Psychiatric: Normal judgment and insight. Alert and oriented x 3. Normal mood.    Labs on Admission: I have personally reviewed following labs and imaging studies  CBC: Recent Labs  Lab 10/08/17 1741  WBC 10.9*  HGB 13.1  HCT 39.6  MCV 87.8  PLT 841   Basic Metabolic Panel: Recent Labs  Lab 10/08/17 1741 10/08/17 1930  NA 137  --   K <2.0*  --   CL 90*  --   CO2 31  --   GLUCOSE 129*  --   BUN <5*  --   CREATININE 1.00  --   CALCIUM 8.3*  --   MG  --  1.4*  PHOS  --  <1.0*   GFR: Estimated Creatinine Clearance: 38.1 mL/min (by C-G formula based on SCr of 1 mg/dL). Liver Function Tests: No results for input(s): AST, ALT, ALKPHOS, BILITOT, PROT, ALBUMIN in the last 168 hours. No results for input(s): LIPASE, AMYLASE in the last 168 hours. No results for input(s): AMMONIA in the last 168 hours. Coagulation Profile: No results for input(s): INR, PROTIME in the last 168 hours. Cardiac Enzymes: No results for input(s): CKTOTAL, CKMB, CKMBINDEX, TROPONINI in the last 168 hours. BNP (last 3 results) No results for input(s): PROBNP in the last 8760 hours. HbA1C: No results for input(s): HGBA1C in the last 72 hours. CBG: No results for input(s): GLUCAP  in the last 168 hours. Lipid Profile: No results for input(s): CHOL, HDL, LDLCALC, TRIG, CHOLHDL, LDLDIRECT in the last 72 hours. Thyroid Function Tests: No results for input(s): TSH, T4TOTAL, FREET4, T3FREE,  THYROIDAB in the last 72 hours. Anemia Panel: No results for input(s): VITAMINB12, FOLATE, FERRITIN, TIBC, IRON, RETICCTPCT in the last 72 hours. Urine analysis:    Component Value Date/Time   COLORURINE YELLOW 06/07/2016 1429   APPEARANCEUR CLEAR 06/07/2016 1429   LABSPEC 1.009 06/07/2016 1429   PHURINE 6.5 06/07/2016 1429   GLUCOSEU NEGATIVE 06/07/2016 1429   HGBUR TRACE (A) 06/07/2016 1429   BILIRUBINUR NEGATIVE 06/07/2016 1429   BILIRUBINUR negative 03/18/2016 1049   KETONESUR NEGATIVE 06/07/2016 1429   PROTEINUR NEGATIVE 06/07/2016 1429   UROBILINOGEN 0.2 03/18/2016 1049   UROBILINOGEN 0.2 09/12/2015 1225   NITRITE NEGATIVE 06/07/2016 1429   LEUKOCYTESUR NEGATIVE 06/07/2016 1429   Sepsis Labs: !!!!!!!!!!!!!!!!!!!!!!!!!!!!!!!!!!!!!!!!!!!! @LABRCNTIP (procalcitonin:4,lacticidven:4) )No results found for this or any previous visit (from the past 240 hour(s)).   Radiological Exams on Admission: No results found.  EKG: Independently reviewed. Sinus tach, bigiminy Old chart reviewed Case discussed with edp  Assessment/Plan 61 yo female with muscle spasms and weakness caused by severe electrolyte derangement  Principal Problem:   Hypokalemia- give iv kphos over the next 6 hours.  kcl orally given.  Repeat k, mag and phos around midnight tonight and then again in am.  Ck ua.  May benefit from nephro eval at some point but her abnormalities may be due to overall poor nutrition. Ck ekg q 12 hours.  Active Problems:   Hypophosphatemia- replete iv kphos   Hypomagnesemia- give 2gm iv mag sulfate , repeat later tonight   IBS (irritable bowel syndrome)- noted   Chronic daily headache- noted   Anal cancer ([place])- noted, in remission   Type 2 diabetes mellitus without complication, without long-term current use of insulin ([place])- noted   Spasms of the hands or feet- due to above   Home med list pending  DVT prophylaxis:  scds Code Status:  full Family Communication:   husband Disposition Plan:  Per day team Consults called:  none Admission status:  admission   Coni Homesley A MD [facility]  If 7PM-7AM, please contact night-coverage [email or web address] Password [facility]  10/08/2017, 9:34 PM

## 2017-10-08 NOTE — ED Triage Notes (Signed)
Per Pt, Pt is coming from home with complaints of muscle cramps and spasms for four days with increasingly getting worse. Pt reports not taking potassium pills because her MD took her off of them. Reports generalized weakness with the cramping and nausea.

## 2017-10-08 NOTE — ED Notes (Addendum)
Phosphorous <1

## 2017-10-08 NOTE — ED Notes (Signed)
Pt reports urine leaked when using purewik. Pt refusing to let this RN clean bed sheets and pt up as she wants to go upstairs and only have to move once due to pain. This RN urged a second time to let this RN clean her and pt adamant about refusing.

## 2017-10-08 NOTE — Progress Notes (Signed)
Received report from [place] in the ED.

## 2017-10-08 NOTE — ED Notes (Signed)
Dr Thomasene Lot given a copy of lactic acid results 2.25

## 2017-10-08 NOTE — ED Notes (Signed)
Purewik placed at pt request.

## 2017-10-08 NOTE — ED Notes (Signed)
MD made aware of pt BP. 500cc NS bolus verbal given. Will cont to monitor pt.

## 2017-10-09 ENCOUNTER — Other Ambulatory Visit: Payer: Self-pay

## 2017-10-09 LAB — BASIC METABOLIC PANEL
Anion gap: 16 — ABNORMAL HIGH (ref 5–15)
Anion gap: 13 (ref 5–15)
Anion gap: 11 (ref 5–15)
BUN: <5 mg/dL — ABNORMAL LOW (ref 6–20)
BUN: 7 mg/dL (ref 6–20)
CALCIUM: 7.6 mg/dL — AB (ref 8.9–10.3)
CALCIUM: 7.1 mg/dL — AB (ref 8.9–10.3)
CHLORIDE: 95 mmol/L — AB (ref 101–111)
CHLORIDE: 101 mmol/L (ref 101–111)
CO2: 31 mmol/L (ref 22–32)
CO2: 29 mmol/L (ref 22–32)
CO2: 29 mmol/L (ref 22–32)
CREATININE: 0.91 mg/dL (ref 0.44–1.00)
CREATININE: 0.74 mg/dL (ref 0.44–1.00)
Calcium: 8.3 mg/dL — ABNORMAL LOW (ref 8.9–10.3)
Chloride: 90 mmol/L — ABNORMAL LOW (ref 101–111)
Creatinine, Ser: 1 mg/dL (ref 0.44–1.00)
GFR calc Af Amer: >60 mL/min (ref >60)
GFR calc Af Amer: >60 mL/min (ref >60)
GFR calc Af Amer: >60 mL/min (ref >60)
GFR calc non Af Amer: 60 mL/min — ABNORMAL LOW (ref >60)
GFR calc non Af Amer: >60 mL/min (ref >60)
GFR calc non Af Amer: >60 mL/min (ref >60)
Glucose, Bld: 129 mg/dL — ABNORMAL HIGH (ref 65–99)
Glucose, Bld: 134 mg/dL — ABNORMAL HIGH (ref 65–99)
Glucose, Bld: 92 mg/dL (ref 65–99)
Potassium: <2 mmol/L — CL (ref 3.5–5.1)
Potassium: <2 mmol/L — CL (ref 3.5–5.1)
Potassium: 2 mmol/L — CL (ref 3.5–5.1)
SODIUM: 137 mmol/L (ref 135–145)
SODIUM: 141 mmol/L (ref 135–145)
Sodium: 137 mmol/L (ref 135–145)

## 2017-10-09 LAB — URINALYSIS, ROUTINE W REFLEX MICROSCOPIC
Bacteria, UA: NONE SEEN
Bilirubin Urine: NEGATIVE
GLUCOSE, UA: NEGATIVE mg/dL
KETONES UR: NEGATIVE mg/dL
LEUKOCYTES UA: NEGATIVE
Nitrite: NEGATIVE
PROTEIN: NEGATIVE mg/dL
Specific Gravity, Urine: 1.003 — ABNORMAL LOW (ref 1.005–1.030)
pH: 7 (ref 5.0–8.0)

## 2017-10-09 LAB — CBC
HEMATOCRIT: 30.1 % — AB (ref 36.0–46.0)
HEMOGLOBIN: 10 g/dL — AB (ref 12.0–15.0)
MCH: 29 pg (ref 26.0–34.0)
MCHC: 33.2 g/dL (ref 30.0–36.0)
MCV: 87.2 fL (ref 78.0–100.0)
Platelets: 196 10*3/uL (ref 150–400)
RBC: 3.45 MIL/uL — ABNORMAL LOW (ref 3.87–5.11)
RDW: 14.6 % (ref 11.5–15.5)
WBC: 4.8 10*3/uL (ref 4.0–10.5)

## 2017-10-09 LAB — PHOSPHORUS: PHOSPHORUS: 4.8 mg/dL — AB (ref 2.5–4.6)

## 2017-10-09 LAB — LACTIC ACID, PLASMA
LACTIC ACID, VENOUS: 4.2 mmol/L — AB (ref 0.5–1.9)
LACTIC ACID, VENOUS: 1 mmol/L (ref 0.5–1.9)

## 2017-10-09 LAB — NA AND K (SODIUM & POTASSIUM), RAND UR
POTASSIUM UR: 13 mmol/L
SODIUM UR: 79 mmol/L

## 2017-10-09 LAB — GLUCOSE, CAPILLARY
GLUCOSE-CAPILLARY: 123 mg/dL — AB (ref 65–99)
GLUCOSE-CAPILLARY: 91 mg/dL (ref 65–99)

## 2017-10-09 LAB — HEMOGLOBIN A1C
Hgb A1c MFr Bld: 5.3 % (ref 4.8–5.6)
Mean Plasma Glucose: 105.41 mg/dL

## 2017-10-09 LAB — MAGNESIUM: Magnesium: 2.1 mg/dL (ref 1.7–2.4)

## 2017-10-09 LAB — POTASSIUM

## 2017-10-09 LAB — TSH: TSH: 1.092 u[IU]/mL (ref 0.350–4.500)

## 2017-10-09 MED ORDER — POTASSIUM CHLORIDE 10 MEQ/100ML IV SOLN
10.0000 meq | INTRAVENOUS | Status: AC
  Administered 2017-10-09 – 2017-10-10 (×4): 10 meq via INTRAVENOUS
  Filled 2017-10-09 (×4): qty 100

## 2017-10-09 MED ORDER — POTASSIUM CHLORIDE CRYS ER 20 MEQ PO TBCR
40.0000 meq | EXTENDED_RELEASE_TABLET | ORAL | Status: AC
  Administered 2017-10-09 (×2): 40 meq via ORAL
  Filled 2017-10-09 (×2): qty 2

## 2017-10-09 MED ORDER — BOOST / RESOURCE BREEZE PO LIQD
1.0000 | Freq: Three times a day (TID) | ORAL | Status: DC
  Administered 2017-10-09: 1 via ORAL
  Filled 2017-10-09 (×6): qty 1

## 2017-10-09 MED ORDER — INSULIN ASPART 100 UNIT/ML ~~LOC~~ SOLN: 0.0000 [IU] | Freq: Three times a day (TID) | SUBCUTANEOUS | Status: DC

## 2017-10-09 MED ORDER — POTASSIUM CHLORIDE 10 MEQ/100ML IV SOLN
10.0000 meq | INTRAVENOUS | Status: AC
  Administered 2017-10-09 (×5): 10 meq via INTRAVENOUS
  Filled 2017-10-09 (×5): qty 100

## 2017-10-09 MED ORDER — PREMIER PROTEIN SHAKE
2.0000 [oz_av] | Freq: Two times a day (BID) | ORAL | Status: DC
  Administered 2017-10-16: 2 [oz_av] via ORAL
  Filled 2017-10-09 (×18): qty 325.31

## 2017-10-09 MED ORDER — ALUM & MAG HYDROXIDE-SIMETH 200-200-20 MG/5ML PO SUSP
30.0000 mL | Freq: Four times a day (QID) | ORAL | Status: DC | PRN
  Administered 2017-10-09: 30 mL via ORAL
  Filled 2017-10-09 (×2): qty 30

## 2017-10-09 NOTE — Progress Notes (Signed)
CRITICAL VALUE ALERT  Critical Value:  Less than 2 K+    Date & Time Notied:  10/09/2017 @ 8177  Provider Notified: Lonny Prude   Orders Received/Actions taken: Continue to give 0.9% NaCl with 40 mEq/L and potassium tablets 40 mEq every 2 hours.

## 2017-10-09 NOTE — Progress Notes (Addendum)
2155: While rounding, charge RN on 5W asked for this patient to be triaged.  I reviewed the H&P note, meds, vitals, I called pharmacy and got clarification on what electrolytes had been replaced, and I also spoke with the ED RN who had this patient. Patient has electrolyte replacements going and labs ordered at 0000 and 0600. 20G x 2 PIVs  I informed charge RN to accept patient to tele and that will follow patient and labs.   At 2235, I saw the patient in the ED. Patient was alert and oriented, conversant, stated she felt so much better and her cramps were almost gone. Skin warm to touch, + pulses, HR appeared normal on the ED tele monitor.   NO RRT INTERVENTIONS  ___________________________________________________________________  2nd Call at [place] by charge RN  Repeat K < 2.0 and Lactic 4.2, I informed the charge RN that I did see that patient in the ED and have the primary RN page primary service to see any orders or interventions need to be carried out.   NO RRT INTERVENTIONS ___________________________________________________________________  3762 I called for an update, patient was still stable per RN, resting comfortably. ___________________________________________________________________  8315 I went to see the RN and patient, patient was resting comfortably and VS remained stable, lactic normalized.

## 2017-10-09 NOTE — Progress Notes (Signed)
PROGRESS NOTE  Erin [name] KVQ:[id number] DOB: Jun 22, 1956 DOA: 10/08/2017 PCP: Lucretia Kern, DO  HPI/Recap of past 24 hours:  Erin [name] is a 61 y.o. year old female with medical history significant for T2DM, Hypothyroidism, GERD, IBS, chronic N/V, hx Anal Ca, Depression, Anxiety, Neurodermatitis, Vertigo, HAs and B12 def , recently hospitalization 07/2017 for PAD s/p aortobifem and bilateral femoral-popliteal bypasses who presented on 10/08/2017 with generalized cramping weakness and was found to have severe hypokalemia.  Patient has a long history of severe hypokalemia requiring repletion.  Previous workup has been unrevealing.  Of note patient was recently hospitalized and discharged from 08/14/2017-9/30 /2018 for critical limb ischemia requiring right aortobifemoral bypass and bilateral femoral-popliteal bypasses.  Patient states since that hospitalization she has been off her oral potassium.  Patient reports experiencing worsening nausea and generalized weakness and cramps for the past 4 days.Upon presentation to the ED, patient's vital signs are notable for tachypnea 28, tachycardic to 132, and blood pressure range 86/76-140 4/81.  Patient was found to have a lactic acid of 2.25.  Mag 1.4 phosphorus less than 1, potassium less than 2.  BMP notable for chloride of 90, BUN less than 5, creatinine 1.  Other lab work notable for WBC: 10.9.  Patient was given potassium IV 10 mEq x4, oral potassium 40 mEq, and IV magnesium 2 g x2.  Patient's lactic acid improved and resolved after bolus of normal saline.  Patient's phosphate was also repleted with IV potassium phosphate 30 mmol.  Patient was admitted to the hospitalist service for further management.  This morning patient reports improvement in cramps.  Also notes improvement in strength.  Patient's husband is at bedside.  Patient denies any new medications.  Patient denies any use of diuretics. She does report 2 episodes of dry heaving  but no emesis.  Assessment/Plan: Principal Problem:   Hypokalemia Active Problems:   IBS (irritable bowel syndrome)   Chronic daily headache   Anal cancer (HCC)   Type 2 diabetes mellitus without complication, without long-term current use of insulin (HCC)   Hypophosphatemia   Hypomagnesemia   Spasms of the hands or feet   Multiple electrolyte imbalances Severe symptomatic Hypokalemia, stable Hypomagnesemia, resolved Hypophosphatemia, resolved Unclear etiology.  No new medications toto explain current presentation.  Patient is not on oral diuretics and has had no emesis or diarrhea to explain severity of hypokalemia and hypomagnesemia.  Some element of poor diet (pt 90lbs) however that does not explain the severity of electrolyte imbalance.?laxative abuse.  Has history of intractable nausea and vomiting with EGD in 2016 significant for gastritis. Gastric emptying scan was discussed at the time but wasn't pursuedNo acidosis so RTA thought less likely as well. No alcohol abuse Monitor on telemetry and BMP Obtain spot urinary potassium, to rule out renal wasting IV fluids normal saline with supplemental potassium, will continue for 12 hours. Continue oral repletion as well If hypokalemia persists will consult renal tomorrow  Chronic intractable nausea/vomiting and bloating Ongoing for several years, usually occurring hours after meal.  Previously evaluated by GI with EGD in 2016 which was significant for gastritis.  Gastric emptying scan was discussed but was not pursued.  Possible gastric outlet syndrome versus gastroparesis.  Would be surprising given A1c of 5.3 (02/2017), would expect in poorly controlled diabetic A1c Will likely need gastric empty study as outpatient IV Zofran PRN   Lactic acidosis, resolved In the setting of transient hypotension, with initial blood pressure of 80s over 50s.  Resolved with fluid resuscitation  Hypothyroidism Last TSH in 05/11/2017 normal at 2.34.   Profound weakness in the setting of hypokalemia.  Though could be contributory to hypothyroidism if poorly controlled. Will obtain TSH  Type 2 diabetes, diet-controlled A1c 5.3 (08/14/2017) Monitor closely, sliding scale as needed Obtain A1c as mentioned above  Hyperlipidemia LDL 217 (03/09/2015) Continue home statin  Code Status: Full code  Family Communication: Discussed with husband and patient at bedside  Disposition Plan: Normalization of potassium with IV and oral repletion.  Monitor over the next 24 hours, likely discharge 10/10/2017.   Consultants:  None  Procedures:  None   Antimicrobials:  None   Cultures:  None  DVT prophylaxis: SCDs   Objective: Vitals:   10/08/17 2215 10/08/17 2230 10/08/17 2319 10/09/17 0529  BP: (!) 151/81 (!) 147/80 (!) 127/98 (!) 141/69  Pulse: 87 92 87 76  Resp: 15 16 20 20   Temp:   99.8 F (37.7 C) 98.7 F (37.1 C)  TempSrc:   Oral Oral  SpO2: 100% 99% 100% 99%  Weight:      Height:        Intake/Output Summary (Last 24 hours) at 10/09/2017 0646 Last data filed at 10/08/2017 2223 Gross per 24 hour  Intake 650 ml  Output -  Net 650 ml   Filed Weights   10/08/17 1728  Weight: 40.8 kg (90 lb)    Exam:  General: Lying in bed in Appear in no distress Eyes: anicteric ENT: Oral Mucosa clear ,moist,dry. Neck: no JVD Cardiovascular: regular rate and rhythm, no Murmurs, rubs or gallops, peripheral Pulses Present,  no edema Respiratory: Normal respiratory effort, Bilateral Air entry equal,Clear to Auscultation, no Crackles or wheezes Abdomen: soft, non-distended, non-tender, normal bowel sounds, no guarding or rebound tenderness Skin: no Rash Musculoskeletal:,  no clubbing / cyanosis. No joint deformity upper and lower extremities. Good ROM, no contractures. Normal muscle tone Neurologic: Grossly no focal neuro deficit.Mental status AAOx3, speech normal, Psychiatric:appropriate affect, and mood  Data  Reviewed: CBC: Recent Labs  Lab 10/08/17 1741 10/09/17 0512  WBC 10.9* 4.8  HGB 13.1 10.0*  HCT 39.6 30.1*  MCV 87.8 87.2  PLT 266 502   Basic Metabolic Panel: Recent Labs  Lab 10/08/17 1741 10/08/17 1930 10/08/17 2329  NA 137  --  137  K <2.0*  --  <2.0*  CL 90*  --  95*  CO2 31  --  29  GLUCOSE 129*  --  134*  BUN <5*  --  7  CREATININE 1.00  --  0.91  CALCIUM 8.3*  --  7.6*  MG  --  1.4* 2.1  PHOS  --  <1.0* 2.2*   GFR: Estimated Creatinine Clearance: 41.8 mL/min (by C-G formula based on SCr of 0.91 mg/dL). Liver Function Tests: No results for input(s): AST, ALT, ALKPHOS, BILITOT, PROT, ALBUMIN in the last 168 hours. No results for input(s): LIPASE, AMYLASE in the last 168 hours. No results for input(s): AMMONIA in the last 168 hours. Coagulation Profile: No results for input(s): INR, PROTIME in the last 168 hours. Cardiac Enzymes: No results for input(s): CKTOTAL, CKMB, CKMBINDEX, TROPONINI in the last 168 hours. BNP (last 3 results) No results for input(s): PROBNP in the last 8760 hours. HbA1C: No results for input(s): HGBA1C in the last 72 hours. CBG: No results for input(s): GLUCAP in the last 168 hours. Lipid Profile: No results for input(s): CHOL, HDL, LDLCALC, TRIG, CHOLHDL, LDLDIRECT in the last 72 hours. Thyroid Function Tests:  No results for input(s): TSH, T4TOTAL, FREET4, T3FREE, THYROIDAB in the last 72 hours. Anemia Panel: No results for input(s): VITAMINB12, FOLATE, FERRITIN, TIBC, IRON, RETICCTPCT in the last 72 hours. Urine analysis:    Component Value Date/Time   COLORURINE YELLOW 06/07/2016 1429   APPEARANCEUR CLEAR 06/07/2016 1429   LABSPEC 1.009 06/07/2016 1429   PHURINE 6.5 06/07/2016 1429   GLUCOSEU NEGATIVE 06/07/2016 1429   HGBUR TRACE (A) 06/07/2016 1429   BILIRUBINUR NEGATIVE 06/07/2016 1429   BILIRUBINUR negative 03/18/2016 1049   KETONESUR NEGATIVE 06/07/2016 1429   PROTEINUR NEGATIVE 06/07/2016 1429   UROBILINOGEN 0.2  03/18/2016 1049   UROBILINOGEN 0.2 09/12/2015 1225   NITRITE NEGATIVE 06/07/2016 1429   LEUKOCYTESUR NEGATIVE 06/07/2016 1429   Sepsis Labs: @LABRCNTIP (procalcitonin:4,lacticidven:4)  )No results found for this or any previous visit (from the past 240 hour(s)).    Studies: No results found.  Scheduled Meds: . aspirin EC  81 mg Oral Daily  . iopamidol      . rosuvastatin  20 mg Oral Daily  . cyanocobalamin  1,000 mcg Oral Daily    Continuous Infusions: . 0.9 % NaCl with KCl 40 mEq / L 75 mL/hr (10/09/17 0011)  . potassium chloride 10 mEq (10/09/17 7209)     LOS: 1 day     Desiree Hane, MD [facility] Pager [phone]  If 7PM-7AM, please contact night-coverage [email or web address] Password [facility] 10/09/2017, 6:46 AM

## 2017-10-09 NOTE — Progress Notes (Signed)
CRITICAL VALUE ALERT  Critical Value:  Lactic Acid 4.2 and Potassium < 2.0  Date & Time Notied:  10/09/17 at [place]  Provider Notified: Kim,MD  Orders Received/Actions taken: MD ordered 5 runs of IV potassium which will start after IV K-Phos ends at 0305. MD contributed the elevated LA of 4.2 to the patient being tachycardic earlier in the ED. MD reordered additional lactic acid blood draw with morning labs at 0500. Will continue to monitor and treat per MD orders.

## 2017-10-09 NOTE — Progress Notes (Signed)
NURSING PROGRESS NOTE  Erin [name] MRN: [id number] Admission Data: 10/08/17 at 2310 Attending Provider: Phillips Grout, MD PCP: Lucretia Kern, DO Code status: Full  Allergies:  Allergies  Allergen Reactions  . Amitiza [Lubiprostone] Diarrhea    Uncontrollable diarrhea  . Nortriptyline Other (See Comments)    Stomach distention  . Sulfa Antibiotics Nausea And Vomiting and Other (See Comments)    GI distress/pain  . Atorvastatin Nausea And Vomiting  . Claritin [Loratadine] Other (See Comments)    Hot flashes  . Dexamethasone Itching  . Prednisone Rash   Past Medical History:  Past Medical History:  Diagnosis Date  . Allergy   . Alopecia   . 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 Anxiety   . B12 deficiency anemia 09/14/2015  . Blood transfusion without reported diagnosis   . Chronic back pain   . Chronic daily headache 03/29/2013   takes bc powder  . Closed right hip fracture ([place]) 09/10/2015  . Depression   . DM (diabetes mellitus) ([place])    diet controlled; does not check BG  . GERD (gastroesophageal reflux disease)   . History of hiatal hernia   . HLD (hyperlipidemia)   . Hot flashes   . Hyperlipemia 04/11/2014  . Hypokalemia 05/2016  . Hypothyroidism 09/10/2015  . IBS (irritable bowel syndrome) 03/29/2013  . 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 Tubular adenoma of colon 09/08/2003  . Vertigo   . Wears glasses    Past Surgical History:  Past Surgical History:  Procedure Laterality Date  . ABDOMINAL AORTOGRAM N/A 08/02/2017   Performed by Wellington Hampshire, MD at [place] [facility]  . AORTA BIFEMORAL BYPASS GRAFT N/A 08/14/2017   Performed by Rosetta Posner, MD at [place]    . dental implant    . ECTOPIC PREGNANCY SURGERY    . EMBOLECTOMY FEMORAL N/A 08/14/2017   Performed by Rosetta Posner, MD at [place]  . FLEXIBLE SIGMOIDOSCOPY N/A 08/14/2013   Performed by  Ladene Artist, MD at [place]  . Lower Extremity Angiography Bilateral 08/02/2017   Performed by Wellington Hampshire, MD at [place] [facility]  . MULTIPLE TOOTH EXTRACTIONS    . PILONIDAL CYST EXCISION    . POLYPECTOMY    . Right Femoral to Above Knee Popliteal Bypass Graft using Non-Reversed Greater Saphenous Vein Graft from Right Leg Right 08/14/2017   Performed by Rosetta Posner, MD at [place]  . THROMBECTOMY FEMORAL ARTERY Right 08/14/2017   Performed by Rosetta Posner, MD at [place]  . TONSILLECTOMY    . TOTAL HIP ARTHROPLASTY  09/11/2015   Performed by Renette Butters, MD at [place] is a 61 y.o. female patient, arrived to floor in room [phone] via stretcher, transferred from ED. Patient alert and oriented X 4. No acute distress noted. Denies pain.   Vital signs: Oral temperature 99.8 F (37.7 C), Blood pressure 127/98, Pulse 87, RR 20, SpO2 100 % on room air.  Cardiac monitoring: Telemetry box 5W #35 in place. Second verified.  IV access: Left wrist and Right AC-infusing; condition patent and no redness.  Skin: intact, no pressure ulcer noted in sacral area. Healed scars on legs and lower abdomen.  Patient's ID armband verified with patient/ family, and in place. Information packet given to patient/ family. Fall risk assessed, SR  up X2, patient/ family able to verbalize understanding of risks associated with falls and to call nurse or staff to assist before getting out of bed. Patient/ family oriented to room and equipment. Call bell within reach.

## 2017-10-09 NOTE — Progress Notes (Signed)
MD was notified of CBG 123 and decided to not give insulin due to lack of nutrition. Patient stated she likes the Boost Breeze drinks and would like to start taking those.

## 2017-10-09 NOTE — Progress Notes (Signed)
After being transferred from ED, pt's potassium level has remained <2.0 but lactic acid has increased from 2.25 to 4.2. Maudie Mercury, MD has been notified and he placed an order for 5 runs of potassium. Per [place], the runs of potassium cannot run at the same time as the current infusion of K-phos. Veronda, pharmacist rescheduled the IV potassium runs to begin at 0315 after the K-phos has finished infusing. For the elevated Lactic acid of 4.2, Kim,MD contributed this to the patient's tachycardia in the ED, because the the patient's heart rate has remained in the 90s since arrival to the unit.  Rapid Response RN, Puja, is aware of the patient's situation and the current interventions being ordered by the MD. Will continue to monitor and treat per MD orders.

## 2017-10-09 NOTE — Progress Notes (Signed)
Initial Nutrition Assessment  DOCUMENTATION CODES:   Severe malnutrition in context of chronic illness  INTERVENTION:  1. Premier Protein BID, each supplement provides 160 calories and 30 grams of protien  NUTRITION DIAGNOSIS:   Severe Malnutrition related to chronic illness as evidenced by percent weight loss, severe fat depletion, severe muscle depletion.  GOAL:   Patient will meet greater than or equal to 90% of their needs  MONITOR:   PO intake, I & O's, Labs, Weight trends, Supplement acceptance  REASON FOR ASSESSMENT:   Malnutrition Screening Tool    ASSESSMENT:   Erin [name] is a 61 y.o. female with medical history significant of recent fempop surgery 2 months ago, anal cancer in remission 4 years ago, electrolyte abnormalities, depression, chronic pain, poor appetitite comes in with 4 days of worsening muscle spasms especiaally in her wrists along with feeling very weak  Spoke with Erin [name] at bedside. She reports nausea and vomiting Since September 24th, with weight loss from 102 - 87 pounds. States she has lost 35 pounds since March. Hasn't been eating much during this time, usually just 1/2 a sandwich or a cup of soup or 1/2 a small frozen pizza. Ate some eggs for breakfast this morning but vomited them. Did not want to eat any lunch during visit. She reports inability to tolerate most oral nutrition supplements, but was open to trying chocolate premier protein.  23 pounds/20% severe weight loss per chart. Very close to patient's reported weight loss.  Labs reviewed:  K+ 2.0 Medications reviewed and include:  Insulin, B12 NS 40K+ at 36mL/hr   NUTRITION - FOCUSED PHYSICAL EXAM:    Most Recent Value  Orbital Region  Severe depletion  Upper Arm Region  Severe depletion  Thoracic and Lumbar Region  Severe depletion  Buccal Region  Severe depletion  Temple Region  Severe depletion  Clavicle Bone Region  Severe depletion  Clavicle and Acromion Bone  Region  Severe depletion  Scapular Bone Region  Severe depletion  Dorsal Hand  Severe depletion  Patellar Region  Severe depletion  Anterior Thigh Region  Severe depletion  Posterior Calf Region  Severe depletion  Edema (RD Assessment)  None  Hair  Reviewed  Eyes  Reviewed  Mouth  Reviewed  Skin  Reviewed  Nails  Reviewed       Diet Order:  Diet Heart Room service appropriate? Yes; Fluid consistency: Thin  EDUCATION NEEDS:   Education needs have been addressed  Skin:  Skin Assessment: Reviewed RN Assessment  Last BM:  10/08/2017  Height:   Ht Readings from Last 1 Encounters:  10/08/17 5\' 3"  (1.6 m)    Weight:   Wt Readings from Last 1 Encounters:  10/08/17 90 lb (40.8 kg)    Ideal Body Weight:  52.27 kg  BMI:  Body mass index is 15.94 kg/m.  Estimated Nutritional Needs:   Kcal:  1250-1450 calories  Protein:  61-70 grams (1.5-1.7g/kg)  Fluid:  >1.5L  Erin [name]. Erin Liebig, MS, RD LDN Inpatient Clinical Dietitian Pager [phone]

## 2017-10-09 NOTE — Progress Notes (Signed)
CRITICAL VALUE ALERT  Critical Value:  Potassium 2.0  Date & Time Notied:  10/09/17 at [place]  Provider Notified: Lisbeth Ply [phone]  Orders Received/Actions taken: No orders as of now. Dayshift RN Mickel Baas notified and aware of patient's critical potassium levels

## 2017-10-10 LAB — BASIC METABOLIC PANEL
Anion gap: 9 (ref 5–15)
Anion gap: 9 (ref 5–15)
Anion gap: 9 (ref 5–15)
Anion gap: 7 (ref 5–15)
BUN: <5 mg/dL — ABNORMAL LOW (ref 6–20)
BUN: <5 mg/dL — ABNORMAL LOW (ref 6–20)
CALCIUM: 7.8 mg/dL — AB (ref 8.9–10.3)
CALCIUM: 7.3 mg/dL — AB (ref 8.9–10.3)
CHLORIDE: 104 mmol/L (ref 101–111)
CO2: 28 mmol/L (ref 22–32)
CO2: 27 mmol/L (ref 22–32)
CO2: 26 mmol/L (ref 22–32)
CO2: 26 mmol/L (ref 22–32)
CREATININE: 0.64 mg/dL (ref 0.44–1.00)
Calcium: 7.4 mg/dL — ABNORMAL LOW (ref 8.9–10.3)
Calcium: 7.5 mg/dL — ABNORMAL LOW (ref 8.9–10.3)
Chloride: 104 mmol/L (ref 101–111)
Chloride: 104 mmol/L (ref 101–111)
Chloride: 107 mmol/L (ref 101–111)
Creatinine, Ser: 0.68 mg/dL (ref 0.44–1.00)
Creatinine, Ser: 0.55 mg/dL (ref 0.44–1.00)
Creatinine, Ser: 0.63 mg/dL (ref 0.44–1.00)
GFR calc Af Amer: >60 mL/min (ref >60)
GFR calc Af Amer: >60 mL/min (ref >60)
GFR calc Af Amer: >60 mL/min (ref >60)
GFR calc non Af Amer: >60 mL/min (ref >60)
GFR calc non Af Amer: >60 mL/min (ref >60)
GLUCOSE: 109 mg/dL — AB (ref 65–99)
GLUCOSE: 98 mg/dL (ref 65–99)
Glucose, Bld: 105 mg/dL — ABNORMAL HIGH (ref 65–99)
Glucose, Bld: 88 mg/dL (ref 65–99)
POTASSIUM: 2.6 mmol/L — AB (ref 3.5–5.1)
Potassium: 2 mmol/L — CL (ref 3.5–5.1)
Potassium: 2.4 mmol/L — CL (ref 3.5–5.1)
Potassium: 2.8 mmol/L — ABNORMAL LOW (ref 3.5–5.1)
SODIUM: 140 mmol/L (ref 135–145)
SODIUM: 139 mmol/L (ref 135–145)
Sodium: 141 mmol/L (ref 135–145)
Sodium: 140 mmol/L (ref 135–145)

## 2017-10-10 LAB — GLUCOSE, CAPILLARY
GLUCOSE-CAPILLARY: 98 mg/dL (ref 65–99)
GLUCOSE-CAPILLARY: 103 mg/dL — AB (ref 65–99)
Glucose-Capillary: 98 mg/dL (ref 65–99)
Glucose-Capillary: 109 mg/dL — ABNORMAL HIGH (ref 65–99)

## 2017-10-10 LAB — MAGNESIUM: Magnesium: 1.6 mg/dL — ABNORMAL LOW (ref 1.7–2.4)

## 2017-10-10 MED ORDER — POTASSIUM CHLORIDE CRYS ER 20 MEQ PO TBCR
40.0000 meq | EXTENDED_RELEASE_TABLET | ORAL | Status: AC
  Administered 2017-10-10 (×2): 40 meq via ORAL
  Filled 2017-10-10: qty 2

## 2017-10-10 MED ORDER — POTASSIUM CHLORIDE 10 MEQ/100ML IV SOLN
10.0000 meq | INTRAVENOUS | Status: AC
  Administered 2017-10-10 (×4): 10 meq via INTRAVENOUS
  Filled 2017-10-10 (×4): qty 100

## 2017-10-10 MED ORDER — DICYCLOMINE HCL 10 MG PO CAPS
10.0000 mg | ORAL_CAPSULE | Freq: Three times a day (TID) | ORAL | Status: DC
  Administered 2017-10-11 – 2017-10-14 (×3): 10 mg via ORAL
  Filled 2017-10-10 (×5): qty 1

## 2017-10-10 MED ORDER — LINACLOTIDE 145 MCG PO CAPS
145.0000 ug | ORAL_CAPSULE | Freq: Every day | ORAL | Status: DC
  Administered 2017-10-10 – 2017-10-15 (×5): 145 ug via ORAL
  Filled 2017-10-10 (×7): qty 1

## 2017-10-10 MED ORDER — MAGNESIUM SULFATE 2 GM/50ML IV SOLN
2.0000 g | Freq: Once | INTRAVENOUS | Status: AC
  Administered 2017-10-10: 2 g via INTRAVENOUS
  Filled 2017-10-10: qty 50

## 2017-10-10 MED ORDER — POTASSIUM CHLORIDE CRYS ER 20 MEQ PO TBCR
40.0000 meq | EXTENDED_RELEASE_TABLET | ORAL | Status: AC
  Administered 2017-10-10 (×2): 40 meq via ORAL
  Filled 2017-10-10 (×2): qty 2

## 2017-10-10 MED ORDER — POTASSIUM CHLORIDE CRYS ER 20 MEQ PO TBCR
40.0000 meq | EXTENDED_RELEASE_TABLET | Freq: Once | ORAL | Status: AC
  Administered 2017-10-10: 40 meq via ORAL
  Filled 2017-10-10: qty 2

## 2017-10-10 MED ORDER — PANTOPRAZOLE SODIUM 40 MG PO TBEC
40.0000 mg | DELAYED_RELEASE_TABLET | Freq: Every day | ORAL | Status: DC
  Administered 2017-10-10 – 2017-10-16 (×7): 40 mg via ORAL
  Filled 2017-10-10 (×7): qty 1

## 2017-10-10 NOTE — Progress Notes (Signed)
CRITICAL VALUE ALERT  Critical Value:  Potassium 2.0  Date & Time Notied:  10/10/17 at 0348  Provider Notified: Kirby,NP  Orders Received/Actions taken: Ordered 4 more runs of potassium with additional PO 65mEq of potassium. Will carry out orders and continue to monitor and treat per MD orders.

## 2017-10-10 NOTE — Progress Notes (Signed)
PROGRESS NOTE  Erin [name] ZWC:[id number] DOB: July 11, 1956 DOA: 10/08/2017 PCP: Lucretia Kern, DO  HPI/Recap of past 24 hours:  Erin [name] is a 61 y.o. year old female with medical history significant for T2DM, Hypothyroidism, GERD, IBS, chronic N/V, hx Anal Ca, Depression, Anxiety, Neurodermatitis, Vertigo, HAs and B12 def , recently hospitalization 07/2017 for PAD s/p aortobifem and bilateral femoral-popliteal bypasses who presented on 10/08/2017 with generalized cramping weakness and was found to have severe hypokalemia.  Patient has a long history of severe hypokalemia requiring repletion.  Previous workup has been unrevealing.  Of note patient was recently hospitalized and discharged from 08/14/2017-9/30 /2018 for critical limb ischemia requiring right aortobifemoral bypass and bilateral femoral-popliteal bypasses.  Patient states since that hospitalization she has been off her oral potassium.  Patient reports experiencing worsening nausea and generalized weakness and cramps for the past 4 days.Upon presentation to the ED, patient's vital signs are notable for tachypnea 28, tachycardic to 132, and blood pressure range 86/76-140 4/81.  Patient was found to have a lactic acid of 2.25.  Mag 1.4 phosphorus less than 1, potassium less than 2.  BMP notable for chloride of 90, BUN less than 5, creatinine 1.  Other lab work notable for WBC: 10.9.  Patient was given potassium IV 10 mEq x4, oral potassium 40 mEq, and IV magnesium 2 g x2.  Patient's lactic acid improved and resolved after bolus of normal saline.  Patient's phosphate was also repleted with IV potassium phosphate 30 mmol.  Patient was admitted to the hospitalist service for further management.  This morning patient reports increased strength, improving appetite, tolerating diet.  Assessment/Plan: Principal Problem:   Hypokalemia Active Problems:   IBS (irritable bowel syndrome)   Chronic daily headache   Anal cancer ([place])  Type 2 diabetes mellitus without complication, without long-term current use of insulin (HCC)   Hypophosphatemia   Hypomagnesemia   Spasms of the hands or feet    Severe symptomatic, chronic Hypokalemia,  improving Hypomagnesemia, resolved Hypophosphatemia, resolved  Likely exacerbation in setting of missing her home potassium regimen after recent surgery.  Still unclear why patient is requiring maintenance potassium at home.  No new medications to explain hypokalemia.  Patient is not on oral diuretics and has had no emesis or diarrhea to explain severity of hypokalemia and hypomagnesemia.  Definitely poor diet (pt 90lbs) however that does not explain the severity of electrolyte imbalance.?laxative abuse.  Has history of intractable nausea and vomiting. No acidosis so RTA thought less likely as well. No alcohol abuse.  Spot urine potassium is not indicative of renal wasting.  Patient has required multiple runs of IV potassium and oral potassium and finally potassium no longer undetectable and greater than 2 -Monitor on telemetry and BMP -Continue oral repletion for the next 24 hours to determine patient required home dose of potassium, goal potassium > 3    Chronic intractable nausea/vomiting and bloating, not occurring this admission, stable Irritable bowel syndrome Ongoing for several years, usually occurring hours after meal, no episodes during admission.  Previously evaluated by GI with EGD in 2016 which was significant for gastritis.  Gastric emptying scan was discussed but was not pursued.  Possible gastric outlet syndrome versus gastroparesis.  Would be surprising given A1c of 5.3 (09/2017), would expect in poorly controlled diabetic.  Patient noting improved appetite during this admission. Mentions worsening taste with food since her recent surgery Check Zn in setting of dysguesia Home bentyl, linzess, protonix Will likely need  gastric empty study as outpatient  Zofran PRN Protein  supplement   Lactic acidosis, resolved In the setting of transient hypotension, with initial blood pressure of 80s over 50s. Resolved with fluid resuscitation  Hypothyroidism  TSH on admission normal at 1.092  Likely noncontributory to profound weakness on admission as that has improved with  potassium repletion   Type 2 diabetes, diet-controlled A1c 5.3 (91119/2018) Monitor closely, sliding scale as needed   Hyperlipidemia LDL 217 (03/09/2015) Continue home statin  Code Status: Full code  Family Communication: Discussed with husband and patient at bedside  Disposition Plan: Normalization of potassium with oral repletion.   likely discharge 10/11/2017.   Consultants:  None  Procedures:  None   Antimicrobials:  None   Cultures:  None  DVT prophylaxis: SCDs   Objective: Vitals:   10/10/17 0438 10/10/17 0526 10/10/17 0700 10/10/17 1351  BP: (!) 175/88 (!) 152/88  (!) 141/75  Pulse: 87   90  Resp: 18   17  Temp: 99.4 F (37.4 C)   98 F (36.7 C)  TempSrc: Oral   Oral  SpO2: 100%   100%  Weight:   47.1 kg (103 lb 13.4 oz)   Height:        Intake/Output Summary (Last 24 hours) at 10/10/2017 1524 Last data filed at 10/10/2017 1300 Gross per 24 hour  Intake 1422.5 ml  Output 3350 ml  Net -1927.5 ml   Filed Weights   10/08/17 1728 10/10/17 0700  Weight: 40.8 kg (90 lb) 47.1 kg (103 lb 13.4 oz)    Exam:  General: Lying in bed in, Appear in no distress Eyes: anicteric ENT: Oral Mucosa clear ,moist. Cardiovascular: regular rate and rhythm, no Murmurs, rubs or gallops, ,  no edema Respiratory: Normal respiratory effort, Bilateral Air entry equal,Clear to Auscultation, no Crackles or wheezes Abdomen: soft, non-distended, non-tender, normal bowel sounds, no guarding or rebound tenderness Skin: no Rash Musculoskeletal:,  no clubbing / cyanosis. No joint deformity upper and lower extremities. Good ROM, no contractures. Normal muscle tone Neurologic:  Grossly no focal neuro deficit.Mental status AAOx3, speech normal, Psychiatric:appropriate affect, and mood  Data Reviewed: CBC: Recent Labs  Lab 10/08/17 1741 10/09/17 0512  WBC 10.9* 4.8  HGB 13.1 10.0*  HCT 39.6 30.1*  MCV 87.8 87.2  PLT 266 903   Basic Metabolic Panel: Recent Labs  Lab 10/08/17 1930 10/08/17 2329 10/09/17 0512 10/09/17 1629 10/10/17 0217 10/10/17 0744 10/10/17 1227  NA  --  137 141  --  141 140 139  K  --  <2.0* 2.0* <2.0* 2.0* 2.4* 2.6*  CL  --  95* 101  --  104 104 104  CO2  --  29 29  --  28 27 26   GLUCOSE  --  134* 92  --  105* 88 109*  BUN  --  7 <5*  --  <5* <5* <5*  CREATININE  --  0.91 0.74  --  0.68 0.64 0.55  CALCIUM  --  7.6* 7.1*  --  7.4* 7.5* 7.8*  MG 1.4* 2.1 2.1  --  1.6*  --   --   PHOS <1.0* 2.2* 4.8*  --   --   --   --    GFR: Estimated Creatinine Clearance: 54.9 mL/min (by C-G formula based on SCr of 0.55 mg/dL). Liver Function Tests: No results for input(s): AST, ALT, ALKPHOS, BILITOT, PROT, ALBUMIN in the last 168 hours. No results for input(s): LIPASE, AMYLASE in the last 168 hours.  No results for input(s): AMMONIA in the last 168 hours. Coagulation Profile: No results for input(s): INR, PROTIME in the last 168 hours. Cardiac Enzymes: No results for input(s): CKTOTAL, CKMB, CKMBINDEX, TROPONINI in the last 168 hours. BNP (last 3 results) No results for input(s): PROBNP in the last 8760 hours. HbA1C: Recent Labs    10/09/17 1629  HGBA1C 5.3   CBG: Recent Labs  Lab 10/09/17 1808 10/09/17 2335 10/10/17 0755 10/10/17 1209  GLUCAP 123* 91 98 103*   Lipid Profile: No results for input(s): CHOL, HDL, LDLCALC, TRIG, CHOLHDL, LDLDIRECT in the last 72 hours. Thyroid Function Tests: Recent Labs    10/09/17 1629  TSH 1.092   Anemia Panel: No results for input(s): VITAMINB12, FOLATE, FERRITIN, TIBC, IRON, RETICCTPCT in the last 72 hours. Urine analysis:    Component Value Date/Time   COLORURINE COLORLESS (A)  10/09/2017 2141   APPEARANCEUR CLEAR 10/09/2017 2141   LABSPEC 1.003 (L) 10/09/2017 2141   PHURINE 7.0 10/09/2017 2141   GLUCOSEU NEGATIVE 10/09/2017 2141   HGBUR LARGE (A) 10/09/2017 2141   BILIRUBINUR NEGATIVE 10/09/2017 2141   BILIRUBINUR negative 03/18/2016 1049   KETONESUR NEGATIVE 10/09/2017 2141   PROTEINUR NEGATIVE 10/09/2017 2141   UROBILINOGEN 0.2 03/18/2016 1049   UROBILINOGEN 0.2 09/12/2015 1225   NITRITE NEGATIVE 10/09/2017 2141   LEUKOCYTESUR NEGATIVE 10/09/2017 2141   Sepsis Labs: @LABRCNTIP (procalcitonin:4,lacticidven:4)  )No results found for this or any previous visit (from the past 240 hour(s)).    Studies: No results found.  Scheduled Meds: . aspirin EC  81 mg Oral Daily  . dicyclomine  10 mg Oral TID AC  . feeding supplement  1 Container Oral TID BM  . linaclotide  145 mcg Oral QAC breakfast  . pantoprazole  40 mg Oral QPC breakfast  . protein supplement shake  2 oz Oral BID BM  . rosuvastatin  20 mg Oral Daily  . cyanocobalamin  1,000 mcg Oral Daily    Continuous Infusions:    LOS: 2 days     Desiree Hane, MD [facility] Pager [phone]  If 7PM-7AM, please contact night-coverage [email or web address] Password [email or web address] 10/10/2017, 3:24 PM

## 2017-10-11 LAB — PHOSPHORUS: Phosphorus: 3.3 mg/dL (ref 2.5–4.6)

## 2017-10-11 LAB — BASIC METABOLIC PANEL
Anion gap: 9 (ref 5–15)
CALCIUM: 7.8 mg/dL — AB (ref 8.9–10.3)
CO2: 26 mmol/L (ref 22–32)
CREATININE: 0.63 mg/dL (ref 0.44–1.00)
Chloride: 103 mmol/L (ref 101–111)
GFR calc Af Amer: >60 mL/min (ref >60)
GFR calc non Af Amer: >60 mL/min (ref >60)
Glucose, Bld: 94 mg/dL (ref 65–99)
Potassium: 2.7 mmol/L — CL (ref 3.5–5.1)
SODIUM: 138 mmol/L (ref 135–145)

## 2017-10-11 LAB — MAGNESIUM: Magnesium: 1.6 mg/dL — ABNORMAL LOW (ref 1.7–2.4)

## 2017-10-11 LAB — GLUCOSE, CAPILLARY
GLUCOSE-CAPILLARY: 108 mg/dL — AB (ref 65–99)
GLUCOSE-CAPILLARY: 96 mg/dL (ref 65–99)
Glucose-Capillary: 99 mg/dL (ref 65–99)
Glucose-Capillary: 117 mg/dL — ABNORMAL HIGH (ref 65–99)

## 2017-10-11 MED ORDER — MAGNESIUM SULFATE 4 GM/100ML IV SOLN
4.0000 g | Freq: Once | INTRAVENOUS | Status: AC
  Administered 2017-10-11: 4 g via INTRAVENOUS
  Filled 2017-10-11: qty 100

## 2017-10-11 MED ORDER — POTASSIUM CHLORIDE CRYS ER 20 MEQ PO TBCR
40.0000 meq | EXTENDED_RELEASE_TABLET | Freq: Two times a day (BID) | ORAL | Status: DC
  Administered 2017-10-11 – 2017-10-13 (×5): 40 meq via ORAL
  Filled 2017-10-11 (×5): qty 2

## 2017-10-11 MED ORDER — POTASSIUM CHLORIDE 10 MEQ/100ML IV SOLN
10.0000 meq | INTRAVENOUS | Status: AC
  Administered 2017-10-11 (×3): 10 meq via INTRAVENOUS
  Filled 2017-10-11 (×3): qty 100

## 2017-10-11 NOTE — Progress Notes (Signed)
PROGRESS NOTE  Erin [name] QBH:[id number] DOB: February 10, 1956 DOA: 10/08/2017 PCP: Lucretia Kern, DO  HPI/Recap of past 24 hours:  Erin [name] is a 61 y.o. year old female with medical history significant for T2DM, Hypothyroidism, GERD, IBS, chronic N/V, hx Anal Ca, Depression, Anxiety, Neurodermatitis, Vertigo, HAs and B12 def , recently hospitalization 07/2017 for PAD s/p aortobifem and bilateral femoral-popliteal bypasses who presented on 10/08/2017 with generalized cramping weakness and was found to have severe hypokalemia.  Patient has a long history of severe hypokalemia requiring repletion.  Previous workup has been unrevealing.  Of note patient was recently hospitalized and discharged from 08/14/2017-9/30 /2018 for critical limb ischemia requiring right aortobifemoral bypass and bilateral femoral-popliteal bypasses.  Patient states since that hospitalization she has been off her oral potassium.  Patient reports experiencing worsening nausea and generalized weakness and cramps for the past 4 days.Upon presentation to the ED, patient's vital signs are notable for tachypnea 28, tachycardic to 132, and blood pressure range 86/76-140 4/81.  Patient was found to have a lactic acid of 2.25.  Mag 1.4 phosphorus less than 1, potassium less than 2.  BMP notable for chloride of 90, BUN less than 5, creatinine 1.  Other lab work notable for WBC: 10.9.  Patient was given potassium IV 10 mEq x4, oral potassium 40 mEq, and IV magnesium 2 g x2.  Patient's lactic acid improved and resolved after bolus of normal saline.  Patient's phosphate was also repleted with IV potassium phosphate 30 mmol.  Patient was admitted to the hospitalist service for further management.  This morning patient reports improvement in cramps.  Also notes improvement in strength.  Patient's husband is at bedside.  Patient denies any new medications.  Patient denies any use of diuretics. She does report 2 episodes of dry heaving  but no emesis.  Assessment/Plan: Principal Problem:   Hypokalemia Active Problems:   IBS (irritable bowel syndrome)   Chronic daily headache   Anal cancer (HCC)   Type 2 diabetes mellitus without complication, without long-term current use of insulin (HCC)   Hypophosphatemia   Hypomagnesemia   Spasms of the hands or feet   Multiple electrolyte imbalances Severe symptomatic Hypokalemia, stable Hypomagnesemia, resolved Hypophosphatemia, resolved Unclear etiology.  No new medications toto explain current presentation.  Patient is not on oral diuretics and has had no emesis or diarrhea to explain severity of hypokalemia and hypomagnesemia.  Some element of poor diet (pt 90lbs) however that does not explain the severity of electrolyte imbalance.?laxative abuse.  Has history of intractable nausea and vomiting with EGD in 2016 significant for gastritis. Gastric emptying scan was discussed at the time but wasn't pursuedNo acidosis so RTA thought less likely as well. No alcohol abuse Monitor on telemetry and BMP Obtain spot urinary potassium, to rule out renal wasting IV fluids normal saline with supplemental potassium, will continue for 12 hours. Continue oral repletion as well Pt still having hypokalemia as such will replace IV and po. Reassess magnesium and K level next am.  Chronic intractable nausea/vomiting and bloating Ongoing for several years, usually occurring hours after meal.  Previously evaluated by GI with EGD in 2016 which was significant for gastritis.  Gastric emptying scan was discussed but was not pursued.  Possible gastric outlet syndrome versus gastroparesis.  Would be surprising given A1c of 5.3 (02/2017), would expect in poorly controlled diabetic A1c Will likely need gastric empty study as outpatient IV Zofran PRN   Lactic acidosis, resolved In the setting  of transient hypotension, with initial blood pressure of 80s over 50s. Resolved with fluid  resuscitation  Hypothyroidism Last TSH in 05/11/2017 normal at 2.34.  Profound weakness in the setting of hypokalemia.  Though could be contributory to hypothyroidism if poorly controlled. TSH within normal limits  Type 2 diabetes, diet-controlled A1c 5.3 (08/14/2017) Monitor closely, sliding scale as needed Obtain A1c as mentioned above  Hyperlipidemia LDL 217 (03/09/2015) Continue home statin  Code Status: Full code  Family Communication: Discussed with husband and patient at bedside  Disposition Plan: Normalization of potassium with IV and oral repletion.  Monitor over the next 24 hours, likely discharge 10/10/2017.   Consultants:  None  Procedures:  None   Antimicrobials:  None   Cultures:  None  DVT prophylaxis: SCDs   Objective: Vitals:   10/10/17 1351 10/10/17 2123 10/11/17 0611 10/11/17 1407  BP: (!) 141/75 (!) 147/86 (!) 171/86 96/74  Pulse: 90 87 84 69  Resp: 17 18 18 16   Temp: 98 F (36.7 C) 98.2 F (36.8 C) 98.3 F (36.8 C) 97.7 F (36.5 C)  TempSrc: Oral Oral Oral Oral  SpO2: 100% 100% 98% (!) 86%  Weight:   46.4 kg (102 lb 4.7 oz)   Height:        Intake/Output Summary (Last 24 hours) at 10/11/2017 1723 Last data filed at 10/11/2017 1500 Gross per 24 hour  Intake 180 ml  Output 1400 ml  Net -1220 ml   Filed Weights   10/08/17 1728 10/10/17 0700 10/11/17 8242  Weight: 40.8 kg (90 lb) 47.1 kg (103 lb 13.4 oz) 46.4 kg (102 lb 4.7 oz)    Exam:  General: Lying in bed in Appear in no distress Eyes: anicteric ENT: Oral Mucosa clear ,moist,dry. Neck: no JVD Cardiovascular: regular rate and rhythm, no Murmurs, rubs or gallops, peripheral Pulses Present,  no edema Respiratory: Normal respiratory effort, Bilateral Air entry equal,Clear to Auscultation, no Crackles or wheezes Abdomen: soft, non-distended, non-tender, normal bowel sounds, no guarding or rebound tenderness Skin: no Rash Musculoskeletal:,  no clubbing / cyanosis. No joint  deformity upper and lower extremities. Good ROM, no contractures. Normal muscle tone Neurologic: Grossly no focal neuro deficit.Mental status AAOx3, speech normal, Psychiatric:appropriate affect, and mood  Data Reviewed: CBC: Recent Labs  Lab 10/08/17 1741 10/09/17 0512  WBC 10.9* 4.8  HGB 13.1 10.0*  HCT 39.6 30.1*  MCV 87.8 87.2  PLT 266 353   Basic Metabolic Panel: Recent Labs  Lab 10/08/17 1930 10/08/17 2329 10/09/17 0512  10/10/17 0217 10/10/17 0744 10/10/17 1227 10/10/17 1507 10/11/17 0621  NA  --  137 141  --  141 140 139 140 138  K  --  <2.0* 2.0*   < > 2.0* 2.4* 2.6* 2.8* 2.7*  CL  --  95* 101  --  104 104 104 107 103  CO2  --  29 29  --  28 27 26 26 26   GLUCOSE  --  134* 92  --  105* 88 109* 98 94  BUN  --  7 <5*  --  <5* <5* <5* <5* <5*  CREATININE  --  0.91 0.74  --  0.68 0.64 0.55 0.63 0.63  CALCIUM  --  7.6* 7.1*  --  7.4* 7.5* 7.8* 7.3* 7.8*  MG 1.4* 2.1 2.1  --  1.6*  --   --   --  1.6*  PHOS <1.0* 2.2* 4.8*  --   --   --   --   --  3.3   < > = values in this interval not displayed.   GFR: Estimated Creatinine Clearance: 54.1 mL/min (by C-G formula based on SCr of 0.63 mg/dL). Liver Function Tests: No results for input(s): AST, ALT, ALKPHOS, BILITOT, PROT, ALBUMIN in the last 168 hours. No results for input(s): LIPASE, AMYLASE in the last 168 hours. No results for input(s): AMMONIA in the last 168 hours. Coagulation Profile: No results for input(s): INR, PROTIME in the last 168 hours. Cardiac Enzymes: No results for input(s): CKTOTAL, CKMB, CKMBINDEX, TROPONINI in the last 168 hours. BNP (last 3 results) No results for input(s): PROBNP in the last 8760 hours. HbA1C: Recent Labs    10/09/17 1629  HGBA1C 5.3   CBG: Recent Labs  Lab 10/10/17 1658 10/10/17 2120 10/11/17 0738 10/11/17 1141 10/11/17 1622  GLUCAP 98 109* 99 117* 108*   Lipid Profile: No results for input(s): CHOL, HDL, LDLCALC, TRIG, CHOLHDL, LDLDIRECT in the last 72  hours. Thyroid Function Tests: Recent Labs    10/09/17 1629  TSH 1.092   Anemia Panel: No results for input(s): VITAMINB12, FOLATE, FERRITIN, TIBC, IRON, RETICCTPCT in the last 72 hours. Urine analysis:    Component Value Date/Time   COLORURINE COLORLESS (A) 10/09/2017 2141   APPEARANCEUR CLEAR 10/09/2017 2141   LABSPEC 1.003 (L) 10/09/2017 2141   PHURINE 7.0 10/09/2017 2141   GLUCOSEU NEGATIVE 10/09/2017 2141   HGBUR LARGE (A) 10/09/2017 2141   BILIRUBINUR NEGATIVE 10/09/2017 2141   BILIRUBINUR negative 03/18/2016 1049   KETONESUR NEGATIVE 10/09/2017 2141   PROTEINUR NEGATIVE 10/09/2017 2141   UROBILINOGEN 0.2 03/18/2016 1049   UROBILINOGEN 0.2 09/12/2015 1225   NITRITE NEGATIVE 10/09/2017 2141   LEUKOCYTESUR NEGATIVE 10/09/2017 2141   Sepsis Labs: @LABRCNTIP (procalcitonin:4,lacticidven:4)  )No results found for this or any previous visit (from the past 240 hour(s)).    Studies: No results found.  Scheduled Meds: . aspirin EC  81 mg Oral Daily  . dicyclomine  10 mg Oral TID AC  . feeding supplement  1 Container Oral TID BM  . linaclotide  145 mcg Oral QAC breakfast  . pantoprazole  40 mg Oral QPC breakfast  . potassium chloride  40 mEq Oral BID  . protein supplement shake  2 oz Oral BID BM  . rosuvastatin  20 mg Oral Daily  . cyanocobalamin  1,000 mcg Oral Daily    Continuous Infusions:    LOS: 3 days   Velvet Bathe, MD [facility] Pager [phone]  If 7PM-7AM, please contact night-coverage [email or web address] Password [facility] 10/11/2017, 5:23 PM

## 2017-10-12 ENCOUNTER — Encounter (HOSPITAL_COMMUNITY): Payer: Self-pay | Admitting: Internal Medicine

## 2017-10-12 DIAGNOSIS — I200 Unstable angina: Secondary | ICD-10-CM

## 2017-10-12 DIAGNOSIS — R9431 Abnormal electrocardiogram [ECG] [EKG]: Secondary | ICD-10-CM

## 2017-10-12 LAB — BASIC METABOLIC PANEL
Anion gap: 6 (ref 5–15)
BUN: 5 mg/dL — ABNORMAL LOW (ref 6–20)
CALCIUM: 7.7 mg/dL — AB (ref 8.9–10.3)
CO2: 25 mmol/L (ref 22–32)
CREATININE: 0.71 mg/dL (ref 0.44–1.00)
Chloride: 105 mmol/L (ref 101–111)
GFR calc Af Amer: >60 mL/min (ref >60)
GLUCOSE: 104 mg/dL — AB (ref 65–99)
POTASSIUM: 3.7 mmol/L (ref 3.5–5.1)
SODIUM: 136 mmol/L (ref 135–145)

## 2017-10-12 LAB — ZINC: ZINC: 76 ug/dL (ref 56–134)

## 2017-10-12 LAB — GLUCOSE, CAPILLARY
GLUCOSE-CAPILLARY: 116 mg/dL — AB (ref 65–99)
Glucose-Capillary: 84 mg/dL (ref 65–99)
Glucose-Capillary: 90 mg/dL (ref 65–99)
Glucose-Capillary: 90 mg/dL (ref 65–99)

## 2017-10-12 LAB — TROPONIN I
TROPONIN I: 0.05 ng/mL — AB (ref <0.03)
Troponin I: 0.07 ng/mL (ref <0.03)
Troponin I: 0.03 ng/mL (ref <0.03)

## 2017-10-12 LAB — MAGNESIUM: MAGNESIUM: 2.2 mg/dL (ref 1.7–2.4)

## 2017-10-12 NOTE — Consult Note (Signed)
CARDIOLOGY CONSULT NOTE     Primary Care Physician: Lucretia Kern, DO Referring Physician:  Dr Wendee Beavers  Admit Date: 10/08/2017  Reason for consultation:  Chest pain, abnormal ekg  Erin [name] is a 61 y.o. female with a h/o peripheral vascular disease, prior anal CA s/p radiation, chemo, DM, HL, and marked hypokalemia who was admitted with symptomatic hypokalemia with K < 2.  She was also noted to have prolonged qt with bigeminal PVCs closely coupled to proceeding T wave.  She has since had correction of her hypokalemia.  Her QT remains prolonged.  She has had syncope in the setting of similar low K in the past.  She is not very active.  Her diet is poor.  She has frequent episodes of chest pain.  She reports "heaviness" with activity at times associated with SOB.  She also has "severe chest tightness" at rest with associated SOB.  She has attributed this to anxiety previously.  She had myoview 9/191/8 preoperatively prior to vascular surgery which was normal.  She has lost over 20 lbs in the past few months.  Today, she denies symptoms of palpitations, lower extremity edema, recent syncope, or neurologic sequela. The patient is tolerating medications without difficulties and is otherwise without complaint today.   Past Medical History:  Diagnosis Date  . Allergy   . Alopecia   . Anal cancer ([place]) 08/14/13   invasive squamous cell ca, s/p radiation 10/20-11/26/14 60.4Gy/77fx and chemo  . Anxiety   . B12 deficiency anemia 09/14/2015  . Blood transfusion without reported diagnosis   . Chronic back pain   . Chronic daily headache 03/29/2013   takes bc powder  . Closed right hip fracture ([place]) 09/10/2015  . Depression   . DM (diabetes mellitus) ([place])    diet controlled; does not check BG  . GERD (gastroesophageal reflux disease)   . History of hiatal hernia   . HLD (hyperlipidemia)   . Hot flashes   . Hyperlipemia 04/11/2014  . Hypokalemia 05/2016  . Hypothyroidism 09/10/2015  . IBS  (irritable bowel syndrome) 03/29/2013  . 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 QT prolongation   . Tubular adenoma of colon 09/08/2003  . Vertigo   . Wears glasses    Past Surgical History:  Procedure Laterality Date  . ABDOMINAL AORTOGRAM N/A 08/02/2017   Procedure: ABDOMINAL AORTOGRAM;  Surgeon: Wellington Hampshire, MD;  Location: [place] [facility];  Service: Cardiovascular;  Laterality: N/A;  . AORTA - BILATERAL FEMORAL ARTERY BYPASS GRAFT N/A 08/14/2017   Procedure: AORTA BIFEMORAL BYPASS GRAFT;  Surgeon: Rosetta Posner, MD;  Location: [facility];  Service: Vascular;  Laterality: N/A;  . COLONOSCOPY    . dental implant    . ECTOPIC PREGNANCY SURGERY    . EMBOLECTOMY N/A 08/14/2017   Procedure: EMBOLECTOMY FEMORAL;  Surgeon: Rosetta Posner, MD;  Location: [facility];  Service: Vascular;  Laterality: N/A;  . FEMORAL-POPLITEAL BYPASS GRAFT Right 08/14/2017   Procedure: Right Femoral to Above Knee Popliteal Bypass Graft using Non-Reversed Greater Saphenous Vein Graft from Right Leg;  Surgeon: Rosetta Posner, MD;  Location: [place];  Service: Vascular;  Laterality: Right;  . FLEXIBLE SIGMOIDOSCOPY N/A 08/14/2013   Procedure: FLEXIBLE SIGMOIDOSCOPY;  Surgeon: Ladene Artist, MD;  Location: [facility];  Service: Endoscopy;  Laterality: N/A;  . LOWER EXTREMITY ANGIOGRAPHY Bilateral 08/02/2017   Procedure: Lower Extremity Angiography;  Surgeon: Wellington Hampshire, MD;  Location: [place] [facility];  Service:  Cardiovascular;  Laterality: Bilateral;  . MULTIPLE TOOTH EXTRACTIONS    . PILONIDAL CYST EXCISION    . POLYPECTOMY    . THROMBECTOMY FEMORAL ARTERY Right 08/14/2017   Procedure: THROMBECTOMY FEMORAL ARTERY;  Surgeon: Rosetta Posner, MD;  Location: [place];  Service: Vascular;  Laterality: Right;  . TONSILLECTOMY    . TOTAL HIP ARTHROPLASTY  09/11/2015   Procedure: TOTAL HIP ARTHROPLASTY;  Surgeon: Renette Butters, MD;  Location: [place];  Service: Orthopedics;;    . aspirin EC  81 mg Oral Daily  .  dicyclomine  10 mg Oral TID AC  . feeding supplement  1 Container Oral TID BM  . linaclotide  145 mcg Oral QAC breakfast  . pantoprazole  40 mg Oral QPC breakfast  . potassium chloride  40 mEq Oral BID  . protein supplement shake  2 oz Oral BID BM  . rosuvastatin  20 mg Oral Daily  . cyanocobalamin  1,000 mcg Oral Daily     Allergies  Allergen Reactions  . Amitiza [Lubiprostone] Diarrhea    Uncontrollable diarrhea  . Nortriptyline Other (See Comments)    Stomach distention  . Sulfa Antibiotics Nausea And Vomiting and Other (See Comments)    GI distress/pain  . Atorvastatin Nausea And Vomiting  . Claritin [Loratadine] Other (See Comments)    Hot flashes  . Dexamethasone Itching  . Prednisone Rash    Social History   Socioeconomic History  . Marital status: Married    Spouse name: Not on file  . Number of children: 0  . Years of education: Not on file  . Highest education level: Not on file  Social Needs  . Financial resource strain: Not on file  . Food insecurity - worry: Not on file  . Food insecurity - inability: Not on file  . Transportation needs - medical: Not on file  . Transportation needs - non-medical: Not on file  Occupational History  . Occupation: caregiver  Tobacco Use  . Smoking status: Former Smoker    Packs/day: 0.50    Types: Cigarettes    Last attempt to quit: 10/07/2014    Years since quitting: 3.0  . Smokeless tobacco: Never Used  Substance and Sexual Activity  . Alcohol use: No    Alcohol/week: 0.0 oz  . Drug use: No  . Sexual activity: Yes    Partners: Male  Other Topics Concern  . Not on file  Social History Narrative   Work or School: homemaker      Home Situation: lives with her husband,[name] takes care of her elderly parents       Spiritual Beliefs: Christian      Lifestyle: no regular exercise, poor diet             Family History  Problem Relation Age of Onset  . Arthritis Mother   . Hyperlipidemia Mother   . Heart  disease Mother   . Hypertension Mother   . Stroke Mother 13  . Irritable bowel syndrome Mother   . Thyroid disease Mother   . Heart attack Mother   . Heart disease Father   . Hyperlipidemia Father   . Hypertension Father   . Stroke Father 63  . Thyroid disease Father   . Prostate cancer Father   . Heart attack Father   . Lung cancer Brother   . Heart attack Maternal Grandfather   . Heart attack Maternal Uncle   . Colon cancer Neg Hx   . Rectal cancer  Neg Hx   . Stomach cancer Neg Hx     ROS- All systems are reviewed and negative except as per the HPI above  Physical Exam: Telemetry: Vitals:   10/11/17 1407 10/11/17 2028 10/12/17 0616 10/12/17 0812  BP: 96/74 123/75 133/80 122/80  Pulse: 69 100 91 89  Resp: 16 16 17 18   Temp: 97.7 F (36.5 C) 98.4 F (36.9 C) 98.2 F (36.8 C) 98.5 F (36.9 C)  TempSrc: Oral Oral Oral Oral  SpO2: (!) 86% 98% 100% 100%  Weight:   89 lb 3.2 oz (40.5 kg)   Height:        GEN- The patient is thin and frail appearing, alert and oriented x 3 today.   Head- normocephalic, atraumatic Eyes-  Sclera clear, conjunctiva pink Ears- hearing intact Oropharynx- clear Neck- supple, no JVP Lungs- Clear to ausculation bilaterally, normal work of breathing Heart- Regular rate and rhythm, no murmurs, rubs or gallops, PMI not laterally displaced GI- soft, NT, ND, + BS Extremities- no clubbing, cyanosis, or edema MS-diffuse muscle atrophy Skin- no rash or lesion Psych- euthymic mood, full affect Neuro- strength and sensation are intact  EKGs:  Multiple EKG tracings from this admit are personally reviewed.   Initial EKG reveals marked qt prolongation with PVCs closely coupled to proceeding t wave.  Subsequent ekgs reveal ongoing qt prolongation, without PVCs,  Most recent ekg reveals new marked twi.  These are new when compared to prior ekgs and may represent ischemic changes.  Labs:   Lab Results  Component Value Date   WBC 4.8 10/09/2017   HGB  10.0 (L) 10/09/2017   HCT 30.1 (L) 10/09/2017   MCV 87.2 10/09/2017   PLT 196 10/09/2017    Recent Labs  Lab 10/12/17 0235  NA 136  K 3.7  CL 105  CO2 25  BUN 5*  CREATININE 0.71  CALCIUM 7.7*  GLUCOSE 104*   Lab Results  Component Value Date   TROPONINI 0.07 (HH) 10/12/2017    Lab Results  Component Value Date   CHOL 307 (H) 05/11/2017   CHOL 349 (H) 09/26/2016   CHOL 351 (H) 03/18/2016   Lab Results  Component Value Date   HDL 44.70 05/11/2017   HDL 39.70 09/26/2016   HDL 37.90 (L) 03/18/2016   Lab Results  Component Value Date   LDLCALC 217 (H) 03/09/2015   LDLCALC 202 (H) 04/11/2014   Lab Results  Component Value Date   TRIG 366.0 (H) 09/26/2016   TRIG 205.0 (H) 03/18/2016   TRIG 198.0 (H) 03/09/2015   Lab Results  Component Value Date   CHOLHDL 9 09/26/2016   CHOLHDL 9 03/18/2016   CHOLHDL 8 03/09/2015   Lab Results  Component Value Date   LDLDIRECT 214.0 09/26/2016   LDLDIRECT 238.0 03/18/2016   LDLDIRECT 199.3 10/27/2014      Radiology:  Brantley Fling 08/09/17 is reviewed as above   Echo:  EF 55-60%  ASSESSMENT AND PLAN:   1. Chest pain with T wave changes The patient has symptoms worrisome for ischemia (CP and SOB).  She has multiple CAD risk factors including PVD, diabetes, HL, prior tobacco, family history, and sedentary lifestyle.  Given her risk factors, he overall risk of CV death, stroke, and MI is quite high.  Though she had low risk myoview several months ago, she has marked ekg changes at this time.  I would therefore advise further CV risk stratification with left heart catheterization.  I would therefore recommend left heart  catheterization with possible PCI.  Discussed the cath with the patient. The patient understands that risks included but are not limited to stroke (1 in 1000), death (1 in 59), kidney failure [usually temporary] (1 in 500), bleeding (1 in 200), allergic reaction [possibly serious] (1 in 200). The patient understands  and agrees to proceed.  I have placed orders with tentative plan for cath tomorrow.  2. Prolonged qt Secondary to hypokalemia Would keep Mg > 1.8 and K > 3.9 Avoid qt prolonging medicines going forward  3. Hypokalemia Primary team to investigate the cause Could consider addition of spironolactone however given low BP and admit with dehydration, I am reluctant to do so.  General cardiology to follow with you.  Thompson Grayer, MD 10/12/2017  11:30 AM

## 2017-10-12 NOTE — Progress Notes (Signed)
PROGRESS NOTE  Erin [name] TKW:[id number] DOB: Apr 14, 1956 DOA: 10/08/2017 PCP: Lucretia Kern, DO  HPI/Recap of past 24 hours:  Erin [name] is a 61 y.o. year old female with medical history significant for T2DM, Hypothyroidism, GERD, IBS, chronic N/V, hx Anal Ca, Depression, Anxiety, Neurodermatitis, Vertigo, HAs and B12 def , recently hospitalization 07/2017 for PAD s/p aortobifem and bilateral femoral-popliteal bypasses who presented on 10/08/2017 with generalized cramping weakness and was found to have severe hypokalemia.  Patient has a long history of severe hypokalemia requiring repletion.  Previous workup has been unrevealing.  Of note patient was recently hospitalized and discharged from 08/14/2017-9/30 /2018 for critical limb ischemia requiring right aortobifemoral bypass and bilateral femoral-popliteal bypasses.  Patient states since that hospitalization she has been off her oral potassium.  Patient reports experiencing worsening nausea and generalized weakness and cramps for the past 4 days.Upon presentation to the ED, patient's vital signs are notable for tachypnea 28, tachycardic to 132, and blood pressure range 86/76-140 4/81.  Patient was found to have a lactic acid of 2.25.  Mag 1.4 phosphorus less than 1, potassium less than 2.  BMP notable for chloride of 90, BUN less than 5, creatinine 1.  Other lab work notable for WBC: 10.9.  Patient was given potassium IV 10 mEq x4, oral potassium 40 mEq, and IV magnesium 2 g x2.  Patient's lactic acid improved and resolved after bolus of normal saline.  Patient's phosphate was also repleted with IV potassium phosphate 30 mmol.  Patient was admitted to the hospitalist service for further management.  This morning patient reports improvement in cramps.  Also notes improvement in strength.  Patient's husband is at bedside.  Patient denies any new medications. Patient denies any use of diuretics. She does report 2 episodes of dry heaving but  no emesis.  Assessment/Plan: Principal Problem:   Hypokalemia Active Problems:   IBS (irritable bowel syndrome)   Chronic daily headache   Anal cancer (HCC)   Type 2 diabetes mellitus without complication, without long-term current use of insulin (HCC)   Hypophosphatemia   Hypomagnesemia   Spasms of the hands or feet   Multiple electrolyte imbalances Severe symptomatic Hypokalemia, stable Hypomagnesemia, resolved Hypophosphatemia, resolved Unclear etiology.  No new medications toto explain current presentation.  Patient is not on oral diuretics and has had no emesis or diarrhea to explain severity of hypokalemia and hypomagnesemia.  Some element of poor diet (pt 90lbs) however that does not explain the severity of electrolyte imbalance - replaced yesterday IV and PO. Pt may have had some absorption problems given report of diarrhea that may have explained why despite so much oral replacement she was still low.  Chronic intractable nausea/vomiting and bloating Ongoing for several years, usually occurring hours after meal.  Previously evaluated by GI with EGD in 2016 which was significant for gastritis.  Gastric emptying scan was discussed but was not pursued.  Possible gastric outlet syndrome versus gastroparesis.  Would be surprising given A1c of 5.3 (02/2017), would expect in poorly controlled diabetic A1c Will likely need gastric empty study as outpatient IV Zofran PRN  Inverted T wave - obtained troponin and consulted cardiology - plan is for heart catheterization tomorrow.   Lactic acidosis, resolved Resolved with fluid resuscitation  Hypothyroidism Last TSH in 05/11/2017 normal at 2.34.  Profound weakness in the setting of hypokalemia.  Though could be contributory to hypothyroidism if poorly controlled. TSH within normal limits  Type 2 diabetes, diet-controlled A1c 5.3 (08/14/2017)  Monitor closely, Continue SSI regimen.  Hyperlipidemia LDL 217 (03/09/2015) Continue home  statin  Code Status: Full code  Family Communication: Discussed with husband and patient at bedside  Disposition Plan: Normalization of potassium with IV and oral repletion.  Monitor over the next 24 hours, likely discharge 10/10/2017.   Consultants:  None  Procedures:  None   Antimicrobials:  None   Cultures:  None  DVT prophylaxis: SCDs   Objective: Vitals:   10/11/17 1407 10/11/17 2028 10/12/17 0616 10/12/17 0812  BP: 96/74 123/75 133/80 122/80  Pulse: 69 100 91 89  Resp: 16 16 17 18   Temp: 97.7 F (36.5 C) 98.4 F (36.9 C) 98.2 F (36.8 C) 98.5 F (36.9 C)  TempSrc: Oral Oral Oral Oral  SpO2: (!) 86% 98% 100% 100%  Weight:   40.5 kg (89 lb 3.2 oz)   Height:        Intake/Output Summary (Last 24 hours) at 10/12/2017 1349 Last data filed at 10/11/2017 2028 Gross per 24 hour  Intake 360 ml  Output 2250 ml  Net -1890 ml   Filed Weights   10/10/17 0700 10/11/17 0611 10/12/17 0616  Weight: 47.1 kg (103 lb 13.4 oz) 46.4 kg (102 lb 4.7 oz) 40.5 kg (89 lb 3.2 oz)    Exam: Exam unchanged when compared to prior  General: Lying in bed in Appear in no distress Eyes: anicteric ENT: Oral Mucosa clear ,moist,dry. Neck: no JVD Cardiovascular: regular rate and rhythm, no Murmurs, rubs or gallops, peripheral Pulses Present,  no edema Respiratory: Normal respiratory effort, Bilateral Air entry equal,Clear to Auscultation, no Crackles or wheezes Abdomen: soft, non-distended, non-tender, normal bowel sounds, no guarding or rebound tenderness Skin: no Rash Musculoskeletal:,  no clubbing / cyanosis. No joint deformity upper and lower extremities. Good ROM, no contractures. Normal muscle tone Neurologic: Grossly no focal neuro deficit.Mental status AAOx3, speech normal, Psychiatric:appropriate affect, and mood  Data Reviewed: CBC: Recent Labs  Lab 10/08/17 1741 10/09/17 0512  WBC 10.9* 4.8  HGB 13.1 10.0*  HCT 39.6 30.1*  MCV 87.8 87.2  PLT 266 109    Basic Metabolic Panel: Recent Labs  Lab 10/08/17 1930 10/08/17 2329 10/09/17 0512  10/10/17 0217 10/10/17 0744 10/10/17 1227 10/10/17 1507 10/11/17 0621 10/12/17 0235  NA  --  137 141  --  141 140 139 140 138 136  K  --  <2.0* 2.0*   < > 2.0* 2.4* 2.6* 2.8* 2.7* 3.7  CL  --  95* 101  --  104 104 104 107 103 105  CO2  --  29 29  --  28 27 26 26 26 25   GLUCOSE  --  134* 92  --  105* 88 109* 98 94 104*  BUN  --  7 <5*  --  <5* <5* <5* <5* <5* 5*  CREATININE  --  0.91 0.74  --  0.68 0.64 0.55 0.63 0.63 0.71  CALCIUM  --  7.6* 7.1*  --  7.4* 7.5* 7.8* 7.3* 7.8* 7.7*  MG 1.4* 2.1 2.1  --  1.6*  --   --   --  1.6* 2.2  PHOS <1.0* 2.2* 4.8*  --   --   --   --   --  3.3  --    < > = values in this interval not displayed.   GFR: Estimated Creatinine Clearance: 47.2 mL/min (by C-G formula based on SCr of 0.71 mg/dL). Liver Function Tests: No results for input(s): AST, ALT, ALKPHOS, BILITOT,  PROT, ALBUMIN in the last 168 hours. No results for input(s): LIPASE, AMYLASE in the last 168 hours. No results for input(s): AMMONIA in the last 168 hours. Coagulation Profile: No results for input(s): INR, PROTIME in the last 168 hours. Cardiac Enzymes: Recent Labs  Lab 10/12/17 0838  TROPONINI 0.07*   BNP (last 3 results) No results for input(s): PROBNP in the last 8760 hours. HbA1C: Recent Labs    10/09/17 1629  HGBA1C 5.3   CBG: Recent Labs  Lab 10/11/17 1141 10/11/17 1622 10/11/17 2120 10/12/17 0804 10/12/17 1208  GLUCAP 117* 108* 96 84 90   Lipid Profile: No results for input(s): CHOL, HDL, LDLCALC, TRIG, CHOLHDL, LDLDIRECT in the last 72 hours. Thyroid Function Tests: Recent Labs    10/09/17 1629  TSH 1.092   Anemia Panel: No results for input(s): VITAMINB12, FOLATE, FERRITIN, TIBC, IRON, RETICCTPCT in the last 72 hours. Urine analysis:    Component Value Date/Time   COLORURINE COLORLESS (A) 10/09/2017 2141   APPEARANCEUR CLEAR 10/09/2017 2141   LABSPEC 1.003  (L) 10/09/2017 2141   PHURINE 7.0 10/09/2017 2141   GLUCOSEU NEGATIVE 10/09/2017 2141   HGBUR LARGE (A) 10/09/2017 2141   BILIRUBINUR NEGATIVE 10/09/2017 2141   BILIRUBINUR negative 03/18/2016 1049   KETONESUR NEGATIVE 10/09/2017 2141   PROTEINUR NEGATIVE 10/09/2017 2141   UROBILINOGEN 0.2 03/18/2016 1049   UROBILINOGEN 0.2 09/12/2015 1225   NITRITE NEGATIVE 10/09/2017 2141   LEUKOCYTESUR NEGATIVE 10/09/2017 2141   Sepsis Labs: @LABRCNTIP (procalcitonin:4,lacticidven:4)  )No results found for this or any previous visit (from the past 240 hour(s)).    Studies: No results found.  Scheduled Meds: . aspirin EC  81 mg Oral Daily  . dicyclomine  10 mg Oral TID AC  . feeding supplement  1 Container Oral TID BM  . linaclotide  145 mcg Oral QAC breakfast  . pantoprazole  40 mg Oral QPC breakfast  . potassium chloride  40 mEq Oral BID  . protein supplement shake  2 oz Oral BID BM  . rosuvastatin  20 mg Oral Daily  . cyanocobalamin  1,000 mcg Oral Daily    Continuous Infusions:    LOS: 4 days   Velvet Bathe, MD [facility] Pager [phone]  If 7PM-7AM, please contact night-coverage [email or web address] Password [email or web address] 10/12/2017, 1:49 PM

## 2017-10-12 NOTE — Progress Notes (Addendum)
CCMD reported QTC of 520. Dr.Vega notified.  2:17 reported via text page to cardiology.

## 2017-10-13 ENCOUNTER — Inpatient Hospital Stay (HOSPITAL_COMMUNITY)
Admission: EM | Disposition: A | Discharge status: Home / Self Care | Payer: Self-pay | Source: Home / Self Care | Attending: Family Medicine

## 2017-10-13 DIAGNOSIS — I2510 Atherosclerotic heart disease of native coronary artery without angina pectoris: Secondary | ICD-10-CM | POA: Diagnosis present

## 2017-10-13 DIAGNOSIS — I4581 Long QT syndrome: Secondary | ICD-10-CM

## 2017-10-13 DIAGNOSIS — I25110 Atherosclerotic heart disease of native coronary artery with unstable angina pectoris: Principal | ICD-10-CM

## 2017-10-13 DIAGNOSIS — I200 Unstable angina: Secondary | ICD-10-CM

## 2017-10-13 HISTORY — DX: Atherosclerotic heart disease of native coronary artery without angina pectoris: I25.10

## 2017-10-13 HISTORY — PX: ULTRASOUND GUIDANCE FOR VASCULAR ACCESS: SHX6516

## 2017-10-13 HISTORY — PX: LEFT HEART CATH AND CORONARY ANGIOGRAPHY: CATH118249

## 2017-10-13 HISTORY — PX: CORONARY STENT INTERVENTION: CATH118234

## 2017-10-13 LAB — POCT ACTIVATED CLOTTING TIME
ACTIVATED CLOTTING TIME: 153 s
ACTIVATED CLOTTING TIME: 208 s
Activated Clotting Time: 235 seconds
Activated Clotting Time: 268 seconds
Activated Clotting Time: 285 seconds
Activated Clotting Time: 246 seconds
Activated Clotting Time: 197 seconds

## 2017-10-13 LAB — BASIC METABOLIC PANEL
ANION GAP: 10 (ref 5–15)
BUN: 8 mg/dL (ref 6–20)
CHLORIDE: 109 mmol/L (ref 101–111)
CO2: 21 mmol/L — AB (ref 22–32)
CREATININE: 0.72 mg/dL (ref 0.44–1.00)
Calcium: 8.4 mg/dL — ABNORMAL LOW (ref 8.9–10.3)
GFR calc non Af Amer: >60 mL/min (ref >60)
Glucose, Bld: 88 mg/dL (ref 65–99)
Potassium: 4.8 mmol/L (ref 3.5–5.1)
SODIUM: 140 mmol/L (ref 135–145)

## 2017-10-13 LAB — GLUCOSE, CAPILLARY
GLUCOSE-CAPILLARY: 90 mg/dL (ref 65–99)
Glucose-Capillary: 83 mg/dL (ref 65–99)
Glucose-Capillary: 86 mg/dL (ref 65–99)
Glucose-Capillary: 82 mg/dL (ref 65–99)

## 2017-10-13 LAB — PROTIME-INR
INR: 0.96
PROTHROMBIN TIME: 12.7 s (ref 11.4–15.2)

## 2017-10-13 LAB — MAGNESIUM: MAGNESIUM: 1.7 mg/dL (ref 1.7–2.4)

## 2017-10-13 LAB — SURGICAL PCR SCREEN
MRSA, PCR: NEGATIVE
STAPHYLOCOCCUS AUREUS: NEGATIVE

## 2017-10-13 SURGERY — LEFT HEART CATH AND CORONARY ANGIOGRAPHY
Anesthesia: LOCAL

## 2017-10-13 MED ORDER — LIDOCAINE HCL (PF) 1 % IJ SOLN
INTRAMUSCULAR | Status: AC
  Filled 2017-10-13: qty 30

## 2017-10-13 MED ORDER — ONDANSETRON HCL 4 MG/2ML IJ SOLN
INTRAMUSCULAR | Status: AC
  Filled 2017-10-13: qty 2

## 2017-10-13 MED ORDER — IOPAMIDOL (ISOVUE-370) INJECTION 76%
INTRAVENOUS | Status: AC
  Filled 2017-10-13: qty 100

## 2017-10-13 MED ORDER — SODIUM CHLORIDE 0.9 % IV SOLN: 250.0000 mL | INTRAVENOUS | Status: DC | PRN

## 2017-10-13 MED ORDER — SODIUM CHLORIDE 0.9% FLUSH: 3.0000 mL | INTRAVENOUS | Status: DC | PRN

## 2017-10-13 MED ORDER — SODIUM CHLORIDE 0.9 % WEIGHT BASED INFUSION
1.0000 mL/kg/h | INTRAVENOUS | Status: DC
  Administered 2017-10-13: 1 mL/kg/h via INTRAVENOUS

## 2017-10-13 MED ORDER — HEPARIN SODIUM (PORCINE) 1000 UNIT/ML IJ SOLN
INTRAMUSCULAR | Status: AC
  Filled 2017-10-13: qty 1

## 2017-10-13 MED ORDER — MIDAZOLAM HCL 2 MG/2ML IJ SOLN
INTRAMUSCULAR | Status: DC | PRN
  Administered 2017-10-13 (×2): 0.5 mg via INTRAVENOUS
  Administered 2017-10-13: 1 mg via INTRAVENOUS

## 2017-10-13 MED ORDER — METOPROLOL TARTRATE 5 MG/5ML IV SOLN
2.5000 mg | INTRAVENOUS | Status: DC | PRN
  Administered 2017-10-13: 20:00:00 2.5 mg via INTRAVENOUS
  Filled 2017-10-13: qty 5

## 2017-10-13 MED ORDER — VERAPAMIL HCL 2.5 MG/ML IV SOLN
INTRAVENOUS | Status: AC
  Filled 2017-10-13: qty 2

## 2017-10-13 MED ORDER — MIDAZOLAM HCL 2 MG/2ML IJ SOLN
INTRAMUSCULAR | Status: AC
  Filled 2017-10-13: qty 2

## 2017-10-13 MED ORDER — HEPARIN (PORCINE) IN NACL 2-0.9 UNIT/ML-% IJ SOLN
INTRAMUSCULAR | Status: AC
  Filled 2017-10-13: qty 500

## 2017-10-13 MED ORDER — HEPARIN (PORCINE) IN NACL 2-0.9 UNIT/ML-% IJ SOLN
INTRAMUSCULAR | Status: AC
  Filled 2017-10-13: qty 1000

## 2017-10-13 MED ORDER — FENTANYL CITRATE (PF) 100 MCG/2ML IJ SOLN
INTRAMUSCULAR | Status: DC | PRN
  Administered 2017-10-13 (×2): 25 ug via INTRAVENOUS

## 2017-10-13 MED ORDER — HYDRALAZINE HCL 20 MG/ML IJ SOLN
5.0000 mg | INTRAMUSCULAR | Status: AC | PRN
Start: 2017-10-13 — End: 2017-10-13

## 2017-10-13 MED ORDER — VERAPAMIL HCL 2.5 MG/ML IV SOLN: INTRAVENOUS | Status: DC | PRN

## 2017-10-13 MED ORDER — FENTANYL CITRATE (PF) 100 MCG/2ML IJ SOLN
INTRAMUSCULAR | Status: AC
  Filled 2017-10-13: qty 2

## 2017-10-13 MED ORDER — SODIUM CHLORIDE 0.9 % WEIGHT BASED INFUSION: 3.0000 mL/kg/h | INTRAVENOUS | Status: DC

## 2017-10-13 MED ORDER — HEPARIN SODIUM (PORCINE) 1000 UNIT/ML IJ SOLN
INTRAMUSCULAR | Status: DC | PRN
  Administered 2017-10-13: 3000 [IU] via INTRAVENOUS
  Administered 2017-10-13: 2000 [IU] via INTRAVENOUS
  Administered 2017-10-13: 3000 [IU] via INTRAVENOUS
  Administered 2017-10-13 (×2): 2000 [IU] via INTRAVENOUS

## 2017-10-13 MED ORDER — LABETALOL HCL 5 MG/ML IV SOLN: 10.0000 mg | INTRAVENOUS | Status: AC | PRN

## 2017-10-13 MED ORDER — TICAGRELOR 90 MG PO TABS
90.0000 mg | ORAL_TABLET | Freq: Two times a day (BID) | ORAL | Status: DC
  Administered 2017-10-14 – 2017-10-16 (×6): 90 mg via ORAL
  Filled 2017-10-13 (×6): qty 1

## 2017-10-13 MED ORDER — HEPARIN SODIUM (PORCINE) 5000 UNIT/ML IJ SOLN
5000.0000 [IU] | Freq: Three times a day (TID) | INTRAMUSCULAR | Status: DC
  Administered 2017-10-14 – 2017-10-15 (×5): 5000 [IU] via SUBCUTANEOUS
  Filled 2017-10-13 (×6): qty 1

## 2017-10-13 MED ORDER — SODIUM CHLORIDE 0.9% FLUSH
3.0000 mL | Freq: Two times a day (BID) | INTRAVENOUS | Status: DC
  Administered 2017-10-14 – 2017-10-16 (×5): 3 mL via INTRAVENOUS

## 2017-10-13 MED ORDER — TICAGRELOR 90 MG PO TABS
ORAL_TABLET | ORAL | Status: AC
  Filled 2017-10-13: qty 2

## 2017-10-13 MED ORDER — IOPAMIDOL (ISOVUE-370) INJECTION 76%
INTRAVENOUS | Status: DC | PRN
  Administered 2017-10-13: 145 mL via INTRAVENOUS

## 2017-10-13 MED ORDER — NITROGLYCERIN 1 MG/10 ML FOR IR/CATH LAB
INTRA_ARTERIAL | Status: AC
  Filled 2017-10-13: qty 10

## 2017-10-13 MED ORDER — SODIUM CHLORIDE 0.9 % IV SOLN
INTRAVENOUS | Status: AC
  Administered 2017-10-13: 15:00:00 via INTRAVENOUS

## 2017-10-13 MED ORDER — ACETAMINOPHEN 325 MG PO TABS
ORAL_TABLET | ORAL | Status: AC
Start: 2017-10-13 — End: ?
  Filled 2017-10-13: qty 2

## 2017-10-13 MED ORDER — ANGIOPLASTY BOOK
Freq: Once | Status: AC
  Administered 2017-10-13: 23:00:00
  Filled 2017-10-13: qty 1

## 2017-10-13 MED ORDER — HEPARIN (PORCINE) IN NACL 2-0.9 UNIT/ML-% IJ SOLN
INTRAMUSCULAR | Status: AC | PRN
  Administered 2017-10-13: 500 mL
  Administered 2017-10-13: 1000 mL

## 2017-10-13 MED ORDER — LIDOCAINE HCL (PF) 1 % IJ SOLN
INTRAMUSCULAR | Status: DC | PRN
  Administered 2017-10-13: 2 mL
  Administered 2017-10-13 (×3): 1 mL

## 2017-10-13 MED ORDER — NITROGLYCERIN 1 MG/10 ML FOR IR/CATH LAB
INTRA_ARTERIAL | Status: DC | PRN
  Administered 2017-10-13: 100 ug via INTRACORONARY

## 2017-10-13 MED ORDER — ONDANSETRON HCL 4 MG/2ML IJ SOLN
4.0000 mg | Freq: Four times a day (QID) | INTRAMUSCULAR | Status: DC | PRN
  Administered 2017-10-13 – 2017-10-15 (×2): 4 mg via INTRAVENOUS
  Filled 2017-10-13: qty 2

## 2017-10-13 MED ORDER — SODIUM CHLORIDE 0.9 % WEIGHT BASED INFUSION: 1.0000 mL/kg/h | INTRAVENOUS | Status: DC

## 2017-10-13 MED ORDER — SODIUM CHLORIDE 0.9% FLUSH: 3.0000 mL | Freq: Two times a day (BID) | INTRAVENOUS | Status: DC

## 2017-10-13 MED ORDER — TICAGRELOR 90 MG PO TABS
ORAL_TABLET | ORAL | Status: DC | PRN
  Administered 2017-10-13: 180 mg via ORAL

## 2017-10-13 SURGICAL SUPPLY — 24 items
BALLN EUPHORA RX 1.5X12 (BALLOONS) ×3
BALLN EUPHORA RX 2.0X12 (BALLOONS) ×3
BALLN ~~LOC~~ EUPHORA RX 2.25X8 (BALLOONS) ×3
BALLOON EUPHORA RX 1.5X12 (BALLOONS) IMPLANT
BALLOON EUPHORA RX 2.0X12 (BALLOONS) IMPLANT
BALLOON ~~LOC~~ EUPHORA RX 2.25X8 (BALLOONS) IMPLANT
CATH INFINITI 5FR JL4 (CATHETERS) ×1 IMPLANT
CATH INFINITI JR4 5F (CATHETERS) ×1 IMPLANT
CATH VISTA GUIDE 6FR JR4 SH (CATHETERS) ×1 IMPLANT
COVER PRB 48X5XTLSCP FOLD TPE (BAG) IMPLANT
COVER PROBE 5X48 (BAG) ×3
GLIDESHEATH SLEND SS 6F .021 (SHEATH) ×1 IMPLANT
GUIDEWIRE INQWIRE 1.5J.035X260 (WIRE) IMPLANT
INQWIRE 1.5J .035X260CM (WIRE) ×3
KIT ENCORE 26 ADVANTAGE (KITS) ×1 IMPLANT
KIT HEART LEFT (KITS) ×3 IMPLANT
PACK CARDIAC CATHETERIZATION (CUSTOM PROCEDURE TRAY) ×3 IMPLANT
SHEATH GLIDE SLENDER 4/5FR (SHEATH) ×1 IMPLANT
STENT RESOLUTE ONYX 2.0X12 (Permanent Stent) ×1 IMPLANT
TRANSDUCER W/STOPCOCK (MISCELLANEOUS) ×3 IMPLANT
TUBING CIL FLEX 10 FLL-RA (TUBING) ×3 IMPLANT
WIRE ASAHI FIELDER XT 190CM (WIRE) ×1 IMPLANT
WIRE HI TORQ VERSACORE-J 145CM (WIRE) ×1 IMPLANT
WIRE HI TORQ WHISPER MS 190CM (WIRE) ×1 IMPLANT

## 2017-10-13 NOTE — H&P (View-Only) (Signed)
Progress Note  Patient Name: Erin [name] Date of Encounter: 10/13/2017  Primary Cardiologist:   Subjective   No CP  No SOB    Inpatient Medications    Scheduled Meds: . aspirin EC  81 mg Oral Daily  . dicyclomine  10 mg Oral TID AC  . feeding supplement  1 Container Oral TID BM  . linaclotide  145 mcg Oral QAC breakfast  . pantoprazole  40 mg Oral QPC breakfast  . potassium chloride  40 mEq Oral BID  . protein supplement shake  2 oz Oral BID BM  . rosuvastatin  20 mg Oral Daily  . sodium chloride flush  3 mL Intravenous Q12H  . cyanocobalamin  1,000 mcg Oral Daily   Continuous Infusions: . sodium chloride    . sodium chloride 1 mL/kg/hr (10/13/17 0515)   PRN Meds: sodium chloride, acetaminophen **OR** acetaminophen, alum & mag hydroxide-simeth, ibuprofen, ondansetron **OR** ondansetron (ZOFRAN) IV, oxyCODONE-acetaminophen, sodium chloride flush   Vital Signs    Vitals:   10/12/17 0812 10/12/17 1432 10/12/17 2049 10/13/17 0439  BP: 122/80 117/78 122/75 137/83  Pulse: 89 91 91 98  Resp: 18 16 16 14   Temp: 98.5 F (36.9 C) 98.3 F (36.8 C) 98.3 F (36.8 C) 98.1 F (36.7 C)  TempSrc: Oral Oral Oral Oral  SpO2: 100% 99% 99% 100%  Weight:    90 lb 1.6 oz (40.9 kg)  Height:        Intake/Output Summary (Last 24 hours) at 10/13/2017 0841 Last data filed at 10/13/2017 0654 Gross per 24 hour  Intake 66.83 ml  Output -  Net 66.83 ml   Filed Weights   10/11/17 0611 10/12/17 0616 10/13/17 0439  Weight: 102 lb 4.7 oz (46.4 kg) 89 lb 3.2 oz (40.5 kg) 90 lb 1.6 oz (40.9 kg)    Telemetry      SR - Personally Reviewed  ECG      Physical Exam   GEN: No acute distress.   Neck: No JVD Cardiac: RRR, no murmurs, rubs, or gallops.  Respiratory: Clear to auscultation bilaterally. GI: Soft, nontender, non-distended  MS: No edema; No deformity. Neuro:  Nonfocal  Psych: Normal affect   Labs    Chemistry Recent Labs  Lab 10/11/17 0621 10/12/17 0235  10/13/17 0517  NA 138 136 140  K 2.7* 3.7 4.8  CL 103 105 109  CO2 26 25 21*  GLUCOSE 94 104* 88  BUN <5* 5* 8  CREATININE 0.63 0.71 0.72  CALCIUM 7.8* 7.7* 8.4*  GFRNONAA >60 >60 >60  GFRAA >60 >60 >60  ANIONGAP 9 6 10      Hematology Recent Labs  Lab 10/08/17 1741 10/09/17 0512  WBC 10.9* 4.8  RBC 4.51 3.45*  HGB 13.1 10.0*  HCT 39.6 30.1*  MCV 87.8 87.2  MCH 29.0 29.0  MCHC 33.1 33.2  RDW 14.3 14.6  PLT 266 196    Cardiac Enzymes Recent Labs  Lab 10/12/17 0838 10/12/17 1415 10/12/17 2032  TROPONINI 0.07* 0.05* 0.03*    Recent Labs  Lab 10/08/17 2029  TROPIPOC 0.01     BNPNo results for input(s): BNP, PROBNP in the last 168 hours.   DDimer No results for input(s): DDIMER in the last 168 hours.   Radiology    No results found.  Cardiac Studies     Patient Profile     61 y.o. female  Hx of PVOD, DM, HL and hypokalemia.  Hx of PVCs and prolonged QT  Prsents with "heaviness" with activity and SOB , chest tightness  Assessment & Plan    1  SOB and chest tightness  Plan for L heart cath today   Pt understands risks  Agrees to proceed  2  Prolonged QT  Optimize electrolytes  Await cath findnigs  Optimize electrolytes  Avoid drugs that can prolong   3  K  4.8 today    For questions or updates, please contact [facility] Please consult [email or web address] for contact info under Cardiology/STEMI.      Signed, Dorris Carnes, MD  10/13/2017, 8:41 AM

## 2017-10-13 NOTE — Progress Notes (Signed)
Patient c/o pain rt wrist. Site faintly bruised w/level 1 hematoma. Dr. Saunders Revel in to see. Rt wrist wrapped w/coban and rt arm elevated on pillow. Rt hand and fingers pink, sensation present

## 2017-10-13 NOTE — Brief Op Note (Signed)
BRIEF CARDIAC CATHETERIZATION NOTE  DATE: 10/13/2017 TIME: 2:24 PM  PATIENT:  Erin [name]  61 y.o. female  PRE-OPERATIVE DIAGNOSIS:  unstable angina  POST-OPERATIVE DIAGNOSIS:  same  PROCEDURE:  Procedure(s): LEFT HEART CATH AND CORONARY ANGIOGRAPHY (N/A) Ultrasound Guidance For Vascular Access CORONARY STENT INTERVENTION (N/A)  SURGEON:  Surgeon(s) and Role:    * Brittne Kawasaki, Harrell Gave, MD - Primary  FINDINGS: 1. Severe single vessel coronary artery disease with diffusely diseased RCA with eccentric aneurysm followed by tubular 99% stenosis. 2. Normal left ventricular filling pressure. 3. Mid anterior wall akinesis with otherwise preserved left ventricular contraction. LVEF ~45%. 4. Successful PCI to mid RCA with placement of Resolute Onyx 2.0 x 12 mm drug-eluting stent (post-dilated to 2.3 mm) with 0% residual stenosis and TIMI-3 flow.  RECOMMENDATIONS: 1. Overnight observation; transfer to [facility]. 2. Dual antiplatelet therapy with aspirin and ticagrelor for at least 12 months. 3. Aggressive secondary prevention.  Nelva Bush, MD [facility] Pager: [phone]

## 2017-10-13 NOTE — Progress Notes (Signed)
6 french sheath removed from left brachial artery.  Pressure held x 30 minutes.  HR 120's/NBP 120's/100's during sheath removal.  Site looks good with no hematoma.  Some nausea during sheath removal but otherwise she was fine.  Site dressed with 2x2 and tegaderm.  RN will continue to monitor site.  Instructions given for keeping arm straight.  Good sat waveform from left hand.

## 2017-10-13 NOTE — Progress Notes (Signed)
Progress Note  Patient Name: Erin [name] Date of Encounter: 10/13/2017  Primary Cardiologist:   Subjective   No CP  No SOB    Inpatient Medications    Scheduled Meds: . aspirin EC  81 mg Oral Daily  . dicyclomine  10 mg Oral TID AC  . feeding supplement  1 Container Oral TID BM  . linaclotide  145 mcg Oral QAC breakfast  . pantoprazole  40 mg Oral QPC breakfast  . potassium chloride  40 mEq Oral BID  . protein supplement shake  2 oz Oral BID BM  . rosuvastatin  20 mg Oral Daily  . sodium chloride flush  3 mL Intravenous Q12H  . cyanocobalamin  1,000 mcg Oral Daily   Continuous Infusions: . sodium chloride    . sodium chloride 1 mL/kg/hr (10/13/17 0515)   PRN Meds: sodium chloride, acetaminophen **OR** acetaminophen, alum & mag hydroxide-simeth, ibuprofen, ondansetron **OR** ondansetron (ZOFRAN) IV, oxyCODONE-acetaminophen, sodium chloride flush   Vital Signs    Vitals:   10/12/17 0812 10/12/17 1432 10/12/17 2049 10/13/17 0439  BP: 122/80 117/78 122/75 137/83  Pulse: 89 91 91 98  Resp: 18 16 16 14   Temp: 98.5 F (36.9 C) 98.3 F (36.8 C) 98.3 F (36.8 C) 98.1 F (36.7 C)  TempSrc: Oral Oral Oral Oral  SpO2: 100% 99% 99% 100%  Weight:    90 lb 1.6 oz (40.9 kg)  Height:        Intake/Output Summary (Last 24 hours) at 10/13/2017 0841 Last data filed at 10/13/2017 0654 Gross per 24 hour  Intake 66.83 ml  Output -  Net 66.83 ml   Filed Weights   10/11/17 0611 10/12/17 0616 10/13/17 0439  Weight: 102 lb 4.7 oz (46.4 kg) 89 lb 3.2 oz (40.5 kg) 90 lb 1.6 oz (40.9 kg)    Telemetry      SR - Personally Reviewed  ECG      Physical Exam   GEN: No acute distress.   Neck: No JVD Cardiac: RRR, no murmurs, rubs, or gallops.  Respiratory: Clear to auscultation bilaterally. GI: Soft, nontender, non-distended  MS: No edema; No deformity. Neuro:  Nonfocal  Psych: Normal affect   Labs    Chemistry Recent Labs  Lab 10/11/17 0621 10/12/17 0235  10/13/17 0517  NA 138 136 140  K 2.7* 3.7 4.8  CL 103 105 109  CO2 26 25 21*  GLUCOSE 94 104* 88  BUN <5* 5* 8  CREATININE 0.63 0.71 0.72  CALCIUM 7.8* 7.7* 8.4*  GFRNONAA >60 >60 >60  GFRAA >60 >60 >60  ANIONGAP 9 6 10      Hematology Recent Labs  Lab 10/08/17 1741 10/09/17 0512  WBC 10.9* 4.8  RBC 4.51 3.45*  HGB 13.1 10.0*  HCT 39.6 30.1*  MCV 87.8 87.2  MCH 29.0 29.0  MCHC 33.1 33.2  RDW 14.3 14.6  PLT 266 196    Cardiac Enzymes Recent Labs  Lab 10/12/17 0838 10/12/17 1415 10/12/17 2032  TROPONINI 0.07* 0.05* 0.03*    Recent Labs  Lab 10/08/17 2029  TROPIPOC 0.01     BNPNo results for input(s): BNP, PROBNP in the last 168 hours.   DDimer No results for input(s): DDIMER in the last 168 hours.   Radiology    No results found.  Cardiac Studies     Patient Profile     61 y.o. female  Hx of PVOD, DM, HL and hypokalemia.  Hx of PVCs and prolonged QT  Prsents with "heaviness" with activity and SOB , chest tightness  Assessment & Plan    1  SOB and chest tightness  Plan for L heart cath today   Pt understands risks  Agrees to proceed  2  Prolonged QT  Optimize electrolytes  Await cath findnigs  Optimize electrolytes  Avoid drugs that can prolong   3  K  4.8 today    For questions or updates, please contact [facility] Please consult [email or web address] for contact info under Cardiology/STEMI.      Signed, Dorris Carnes, MD  10/13/2017, 8:41 AM

## 2017-10-13 NOTE — Progress Notes (Signed)
PROGRESS NOTE  Erin [name]:[id number] DOB: [birth year] DOA: 10/08/2017 PCP: Lucretia Kern, DO  HPI/Recap of past 24 hours:  Erin [name] is a 61 y.o. year old female with medical history significant for T2DM, Hypothyroidism, GERD, IBS, chronic N/V, hx Anal Ca, Depression, Anxiety, Neurodermatitis, Vertigo, HAs and B12 def , recently hospitalization 07/2017 for PAD s/p aortobifem and bilateral femoral-popliteal bypasses who presented on 10/08/2017 with generalized cramping weakness and was found to have severe hypokalemia.  Patient has a long history of severe hypokalemia requiring repletion.  Previous workup has been unrevealing.  Of note patient was recently hospitalized and discharged from 08/14/2017-9/30 /2018 for critical limb ischemia requiring right aortobifemoral bypass and bilateral femoral-popliteal bypasses.  Patient states since that hospitalization she has been off her oral potassium.  Patient reports experiencing worsening nausea and generalized weakness and cramps for the past 4 days.Upon presentation to the ED, patient's vital signs are notable for tachypnea 28, tachycardic to 132, and blood pressure range 86/76-140 4/81.  Patient was found to have a lactic acid of 2.25.  Mag 1.4 phosphorus less than 1, potassium less than 2.  BMP notable for chloride of 90, BUN less than 5, creatinine 1.  Other lab work notable for WBC: 10.9.  Patient was given potassium IV 10 mEq x4, oral potassium 40 mEq, and IV magnesium 2 g x2.  Patient's lactic acid improved and resolved after bolus of normal saline.  Patient's phosphate was also repleted with IV potassium phosphate 30 mmol.  Patient was admitted to the hospitalist service for further management.  This morning patient reports improvement in cramps.  Also notes improvement in strength.  Patient's husband is at bedside.  Patient denies any new medications. Patient denies any use of diuretics. She does report 2 episodes of dry heaving but  no emesis.  Assessment/Plan: Principal Problem:   Hypokalemia Active Problems:   IBS (irritable bowel syndrome)   Chronic daily headache   Anal cancer (Erin [name])   Type 2 diabetes mellitus without complication, without long-term current use of insulin (HCC)   Hypophosphatemia   Hypomagnesemia   Spasms of the hands or feet   Unstable angina (HCC)   Multiple electrolyte imbalances Severe symptomatic Hypokalemia, stable Hypomagnesemia, resolved Hypophosphatemia, resolved Resolved on last check  Chronic intractable nausea/vomiting and bloating Ongoing for several years, usually occurring hours after meal.  Previously evaluated by GI with EGD in 2016 which was significant for gastritis.  Gastric emptying scan was discussed but was not pursued.  Possible gastric outlet syndrome versus gastroparesis.  Would be surprising given A1c of 5.3 (02/2017), would expect in poorly controlled diabetic A1c Will likely need gastric empty study as outpatient IV Zofran PRN  Inverted T wave - Pt for L heart cath. Awaiting final recommendations from cardiology.   Lactic acidosis, resolved Resolved with fluid resuscitation  Hypothyroidism Last TSH in 05/11/2017 normal at 2.34.  Profound weakness in the setting of hypokalemia.  Though could be contributory to hypothyroidism if poorly controlled. TSH within normal limits  Type 2 diabetes, diet-controlled A1c 5.3 (08/14/2017) Monitor closely, Continue SSI regimen.  Hyperlipidemia LDL 217 (03/09/2015) Continue home statin  Code Status: Full code  Family Communication: Discussed with husband and patient at bedside  Disposition Plan: Normalization of potassium with IV and oral repletion.  Monitor over the next 24 hours, likely discharge 10/10/2017.   Consultants:  None  Procedures:  None   Antimicrobials:  None   Cultures:  None  DVT prophylaxis: SCDs  Objective: Vitals:   10/13/17 1600 10/13/17 1615 10/13/17 1630 10/13/17 1645    BP: 140/86 128/84 (!) 147/91 128/90  Pulse:   (!) 109   Resp: 17 19 (!) 24 (!) 31  Temp:      TempSrc:      SpO2: 100%  100% 100%  Weight:      Height:        Intake/Output Summary (Last 24 hours) at 10/13/2017 1732 Last data filed at 10/13/2017 0654 Gross per 24 hour  Intake 66.83 ml  Output -  Net 66.83 ml   Filed Weights   10/11/17 3151 10/12/17 0616 10/13/17 0439  Weight: 46.4 kg (102 lb 4.7 oz) 40.5 kg (89 lb 3.2 oz) 40.9 kg (90 lb 1.6 oz)    Exam: Exam unchanged when compared to prior on 11/22  General: Lying in bed in Appear in no distress Eyes: anicteric ENT: Oral Mucosa clear ,moist,dry. Neck: no JVD Cardiovascular: regular rate and rhythm, no Murmurs, rubs or gallops, peripheral Pulses Present,  no edema Respiratory: Normal respiratory effort, Bilateral Air entry equal,Clear to Auscultation, no Crackles or wheezes Abdomen: soft, non-distended, non-tender, normal bowel sounds, no guarding or rebound tenderness Skin: no Rash Musculoskeletal:,  no clubbing / cyanosis. No joint deformity upper and lower extremities. Good ROM, no contractures. Normal muscle tone Neurologic: Grossly no focal neuro deficit.Mental status AAOx3, speech normal, Psychiatric:appropriate affect, and mood  Data Reviewed: CBC: Recent Labs  Lab 10/08/17 1741 10/09/17 0512  WBC 10.9* 4.8  HGB 13.1 10.0*  HCT 39.6 30.1*  MCV 87.8 87.2  PLT 266 761   Basic Metabolic Panel: Recent Labs  Lab 10/08/17 1930 10/08/17 2329 10/09/17 0512  10/10/17 0217  10/10/17 1227 10/10/17 1507 10/11/17 0621 10/12/17 0235 10/13/17 0517  NA  --  137 141  --  141   < > 139 140 138 136 140  K  --  <2.0* 2.0*   < > 2.0*   < > 2.6* 2.8* 2.7* 3.7 4.8  CL  --  95* 101  --  104   < > 104 107 103 105 109  CO2  --  29 29  --  28   < > 26 26 26 25  21*  GLUCOSE  --  134* 92  --  105*   < > 109* 98 94 104* 88  BUN  --  7 <5*  --  <5*   < > <5* <5* <5* 5* 8  CREATININE  --  0.91 0.74  --  0.68   < > 0.55 0.63  0.63 0.71 0.72  CALCIUM  --  7.6* 7.1*  --  7.4*   < > 7.8* 7.3* 7.8* 7.7* 8.4*  MG 1.4* 2.1 2.1  --  1.6*  --   --   --  1.6* 2.2 1.7  PHOS <1.0* 2.2* 4.8*  --   --   --   --   --  3.3  --   --    < > = values in this interval not displayed.   GFR: Estimated Creatinine Clearance: 47.7 mL/min (by C-G formula based on SCr of 0.72 mg/dL). Liver Function Tests: No results for input(s): AST, ALT, ALKPHOS, BILITOT, PROT, ALBUMIN in the last 168 hours. No results for input(s): LIPASE, AMYLASE in the last 168 hours. No results for input(s): AMMONIA in the last 168 hours. Coagulation Profile: Recent Labs  Lab 10/13/17 0517  INR 0.96   Cardiac Enzymes: Recent Labs  Lab 10/12/17 [phone]  10/12/17 1415 10/12/17 2032  TROPONINI 0.07* 0.05* 0.03*   BNP (last 3 results) No results for input(s): PROBNP in the last 8760 hours. HbA1C: No results for input(s): HGBA1C in the last 72 hours. CBG: Recent Labs  Lab 10/12/17 1208 10/12/17 1643 10/12/17 2048 10/13/17 0728 10/13/17 1516  GLUCAP 90 116* 90 83 86   Lipid Profile: No results for input(s): CHOL, HDL, LDLCALC, TRIG, CHOLHDL, LDLDIRECT in the last 72 hours. Thyroid Function Tests: No results for input(s): TSH, T4TOTAL, FREET4, T3FREE, THYROIDAB in the last 72 hours. Anemia Panel: No results for input(s): VITAMINB12, FOLATE, FERRITIN, TIBC, IRON, RETICCTPCT in the last 72 hours. Urine analysis:    Component Value Date/Time   COLORURINE COLORLESS (A) 10/09/2017 2141   APPEARANCEUR CLEAR 10/09/2017 2141   LABSPEC 1.003 (L) 10/09/2017 2141   PHURINE 7.0 10/09/2017 2141   GLUCOSEU NEGATIVE 10/09/2017 2141   HGBUR LARGE (A) 10/09/2017 2141   BILIRUBINUR NEGATIVE 10/09/2017 2141   BILIRUBINUR negative 03/18/2016 1049   KETONESUR NEGATIVE 10/09/2017 2141   PROTEINUR NEGATIVE 10/09/2017 2141   UROBILINOGEN 0.2 03/18/2016 1049   UROBILINOGEN 0.2 09/12/2015 1225   NITRITE NEGATIVE 10/09/2017 2141   LEUKOCYTESUR NEGATIVE 10/09/2017  2141   Sepsis Labs: @LABRCNTIP (procalcitonin:4,lacticidven:4)  ) Recent Results (from the past 240 hour(s))  Surgical PCR screen     Status: None   Collection Time: 10/12/17  9:52 PM  Result Value Ref Range Status   MRSA, PCR NEGATIVE NEGATIVE Final   Staphylococcus aureus NEGATIVE NEGATIVE Final    Comment: (NOTE) The Xpert SA Assay (FDA approved for NASAL specimens in patients 46 years of age and older), is one component of a comprehensive surveillance program. It is not intended to diagnose infection nor to guide or monitor treatment.       Studies: No results found.  Scheduled Meds: . aspirin EC  81 mg Oral Daily  . dicyclomine  10 mg Oral TID AC  . feeding supplement  1 Container Oral TID BM  . [START ON 10/14/2017] heparin  5,000 Units Subcutaneous Q8H  . linaclotide  145 mcg Oral QAC breakfast  . pantoprazole  40 mg Oral QPC breakfast  . potassium chloride  40 mEq Oral BID  . protein supplement shake  2 oz Oral BID BM  . rosuvastatin  20 mg Oral Daily  . sodium chloride flush  3 mL Intravenous Q12H  . ticagrelor  90 mg Oral BID  . cyanocobalamin  1,000 mcg Oral Daily    Continuous Infusions: . sodium chloride 75 mL/hr at 10/13/17 1433  . sodium chloride       LOS: 5 days   Velvet Bathe, MD [facility] Pager [phone]  If 7PM-7AM, please contact night-coverage [email or web address] Password [facility] 10/13/2017, 5:32 PM

## 2017-10-13 NOTE — Interval H&P Note (Signed)
History and Physical Interval Note:  10/13/2017 12:02 PM  Erin [name]  has presented today for cardiac catheterization, with the diagnosis of unstable angina  The various methods of treatment have been discussed with the patient and family. After consideration of risks, benefits and other options for treatment, the patient has consented to  Procedure(s): LEFT HEART CATH AND CORONARY ANGIOGRAPHY (N/A) as a surgical intervention .  The patient's history has been reviewed, patient examined, no change in status, stable for surgery.  I have reviewed the patient's chart and labs.  Questions were answered to the patient's satisfaction.    Cath Lab Visit (complete for each Cath Lab visit)  Clinical Evaluation Leading to the Procedure:   ACS: Yes.   (unstable angina)  Non-ACS:  N/A  Dru [place]

## 2017-10-14 ENCOUNTER — Other Ambulatory Visit: Payer: Self-pay | Admitting: Physician Assistant

## 2017-10-14 ENCOUNTER — Encounter (HOSPITAL_COMMUNITY): Payer: Self-pay | Admitting: Physician Assistant

## 2017-10-14 DIAGNOSIS — I200 Unstable angina: Secondary | ICD-10-CM

## 2017-10-14 DIAGNOSIS — I2510 Atherosclerotic heart disease of native coronary artery without angina pectoris: Secondary | ICD-10-CM

## 2017-10-14 LAB — CBC
HCT: 29.2 % — ABNORMAL LOW (ref 36.0–46.0)
HEMOGLOBIN: 9.3 g/dL — AB (ref 12.0–15.0)
MCH: 29.8 pg (ref 26.0–34.0)
MCHC: 31.8 g/dL (ref 30.0–36.0)
MCV: 93.6 fL (ref 78.0–100.0)
Platelets: 297 10*3/uL (ref 150–400)
RBC: 3.12 MIL/uL — AB (ref 3.87–5.11)
RDW: 16.5 % — ABNORMAL HIGH (ref 11.5–15.5)
WBC: 7.7 10*3/uL (ref 4.0–10.5)

## 2017-10-14 LAB — BASIC METABOLIC PANEL
Anion gap: 7 (ref 5–15)
BUN: 12 mg/dL (ref 6–20)
CALCIUM: 8.3 mg/dL — AB (ref 8.9–10.3)
CHLORIDE: 108 mmol/L (ref 101–111)
CO2: 21 mmol/L — AB (ref 22–32)
CREATININE: 0.81 mg/dL (ref 0.44–1.00)
GFR calc non Af Amer: >60 mL/min (ref >60)
GLUCOSE: 93 mg/dL (ref 65–99)
Potassium: 4.8 mmol/L (ref 3.5–5.1)
Sodium: 136 mmol/L (ref 135–145)

## 2017-10-14 LAB — GLUCOSE, CAPILLARY
GLUCOSE-CAPILLARY: 99 mg/dL (ref 65–99)
GLUCOSE-CAPILLARY: 103 mg/dL — AB (ref 65–99)
Glucose-Capillary: 79 mg/dL (ref 65–99)
Glucose-Capillary: 118 mg/dL — ABNORMAL HIGH (ref 65–99)

## 2017-10-14 LAB — MAGNESIUM: Magnesium: 1.5 mg/dL — ABNORMAL LOW (ref 1.7–2.4)

## 2017-10-14 MED ORDER — POTASSIUM CHLORIDE CRYS ER 20 MEQ PO TBCR: 40.0000 meq | EXTENDED_RELEASE_TABLET | Freq: Every day | ORAL | Status: DC

## 2017-10-14 MED ORDER — POTASSIUM CHLORIDE CRYS ER 20 MEQ PO TBCR: 20.0000 meq | EXTENDED_RELEASE_TABLET | Freq: Every day | ORAL | Status: DC

## 2017-10-14 MED ORDER — TICAGRELOR 90 MG PO TABS: 90.0000 mg | ORAL_TABLET | Freq: Two times a day (BID) | ORAL | 0 refills | Status: DC

## 2017-10-14 MED ORDER — POTASSIUM CHLORIDE CRYS ER 20 MEQ PO TBCR
40.0000 meq | EXTENDED_RELEASE_TABLET | Freq: Every day | ORAL | Status: DC
  Administered 2017-10-14 – 2017-10-16 (×3): 40 meq via ORAL
  Filled 2017-10-14 (×3): qty 2

## 2017-10-14 MED ORDER — CARVEDILOL 3.125 MG PO TABS
3.1250 mg | ORAL_TABLET | Freq: Two times a day (BID) | ORAL | Status: DC
  Administered 2017-10-14 – 2017-10-15 (×3): 3.125 mg via ORAL
  Filled 2017-10-14 (×3): qty 1

## 2017-10-14 MED ORDER — DICYCLOMINE HCL 10 MG PO CAPS
10.0000 mg | ORAL_CAPSULE | Freq: Every day | ORAL | Status: DC
  Administered 2017-10-15 – 2017-10-16 (×2): 10 mg via ORAL
  Filled 2017-10-14 (×2): qty 1

## 2017-10-14 MED ORDER — HYDROXYZINE HCL 25 MG PO TABS
25.0000 mg | ORAL_TABLET | Freq: Once | ORAL | Status: AC
  Administered 2017-10-14: 25 mg via ORAL
  Filled 2017-10-14: qty 1

## 2017-10-14 NOTE — Progress Notes (Signed)
New pt transferred from cath lab. Pt brought to the floor in stable condition. Vitals taken. Initial Assessment done. All immediate pertinent needs to patient addressed. Patient Guide given to patient. Important safety instructions relating to hospitalization reviewed with patient. Pt's cath site is assessed. Patient verbalized understanding. Will continue to monitor pt.

## 2017-10-14 NOTE — Progress Notes (Signed)
PROGRESS NOTE  Erin [name] PFX:[id number] DOB: Apr 19, 1956 DOA: 10/08/2017 PCP: Lucretia Kern, DO  HPI/Recap of past 24 hours:  Erin [name] is a 61 y.o. year old female with medical history significant for T2DM, Hypothyroidism, GERD, IBS, chronic N/V, hx Anal Ca, Depression, Anxiety, Neurodermatitis, Vertigo, HAs and B12 def , recently hospitalization 07/2017 for PAD s/p aortobifem and bilateral femoral-popliteal bypasses who presented on 10/08/2017 with generalized cramping weakness and was found to have severe hypokalemia.  Patient has a long history of severe hypokalemia requiring repletion.  Previous workup has been unrevealing.  Of note patient was recently hospitalized and discharged from 08/14/2017-9/30 /2018 for critical limb ischemia requiring right aortobifemoral bypass and bilateral femoral-popliteal bypasses.  Patient states since that hospitalization she has been off her oral potassium.  Patient reports experiencing worsening nausea and generalized weakness and cramps for the past 4 days.Upon presentation to the ED, patient's vital signs are notable for tachypnea 28, tachycardic to 132, and blood pressure range 86/76-140 4/81.  Patient was found to have a lactic acid of 2.25.  Mag 1.4 phosphorus less than 1, potassium less than 2.  BMP notable for chloride of 90, BUN less than 5, creatinine 1.  Other lab work notable for WBC: 10.9.  Patient was given potassium IV 10 mEq x4, oral potassium 40 mEq, and IV magnesium 2 g x2.  Patient's lactic acid improved and resolved after bolus of normal saline.  Patient's phosphate was also repleted with IV potassium phosphate 30 mmol.  Patient was admitted to the hospitalist service for further management.  This morning patient reports improvement in cramps.  Also notes improvement in strength.  Patient's husband is at bedside.  Patient denies any new medications. Patient denies any use of diuretics. She does report 2 episodes of dry heaving but  no emesis.  Assessment/Plan: Principal Problem:   CAD (coronary artery disease), native coronary artery Active Problems:   IBS (irritable bowel syndrome)   Chronic daily headache   Anal cancer ([place])   Type 2 diabetes mellitus without complication, without long-term current use of insulin (HCC)   Hypokalemia   Hypophosphatemia   Hypomagnesemia   Spasms of the hands or feet   Unstable angina (HCC)   Multiple electrolyte imbalances Severe symptomatic Hypokalemia, stable Hypomagnesemia, resolved Hypophosphatemia, resolved Resolved on last check  Chronic intractable nausea/vomiting and bloating Ongoing for several years, usually occurring hours after meal.  Previously evaluated by GI with EGD in 2016 which was significant for gastritis.  Gastric emptying scan was discussed but was not pursued.  Possible gastric outlet syndrome versus gastroparesis.  Would be surprising given A1c of 5.3 (02/2017), would expect in poorly controlled diabetic A1c Will likely need gastric empty study as outpatient IV Zofran PRN  Inverted T wave - Pt is s/p L heart cath.  - Pt is s/p stent placement to DES to RCA 10/13/17 - Pt is being observed on B blocker  Lactic acidosis, resolved Resolved with fluid resuscitation  Hypothyroidism Last TSH in 05/11/2017 normal at 2.34.  Profound weakness in the setting of hypokalemia.  Though could be contributory to hypothyroidism if poorly controlled. TSH within normal limits  Type 2 diabetes, diet-controlled A1c 5.3 (08/14/2017) Monitor closely, Continue SSI regimen.  Hyperlipidemia LDL 217 (03/09/2015) Continue home statin  Code Status: Full code  Family Communication: Discussed with husband and patient at bedside  Disposition Plan: Normalization of potassium with IV and oral repletion.  Monitor over the next 24 hours, likely discharge 10/10/2017.  Consultants:  None  Procedures:  None   Antimicrobials:  None   Cultures:  None  DVT  prophylaxis: SCDs   Objective: Vitals:   10/14/17 0750 10/14/17 0920 10/14/17 1133 10/14/17 1732  BP: 96/66 130/74 97/64 98/60   Pulse:   (!) 103 (!) 109  Resp: (!) 25 12 20    Temp:   99 F (37.2 C)   TempSrc:   Oral   SpO2:   100%   Weight:   41 kg (90 lb 4.8 oz)   Height:   5\' 3"  (1.6 m)     Intake/Output Summary (Last 24 hours) at 10/14/2017 1831 Last data filed at 10/14/2017 1749 Gross per 24 hour  Intake 1618.75 ml  Output 2100 ml  Net -481.25 ml   Filed Weights   10/13/17 0439 10/14/17 0200 10/14/17 1133  Weight: 40.9 kg (90 lb 1.6 oz) 41.2 kg (90 lb 13.3 oz) 41 kg (90 lb 4.8 oz)    Exam: Exam unchanged when compared to prior on 11/23  General: Lying in bed in Appear in no distress Eyes: anicteric ENT: Oral Mucosa clear ,moist,dry. Neck: no JVD Cardiovascular: regular rate and rhythm, no Murmurs, rubs or gallops, peripheral Pulses Present,  no edema Respiratory: Normal respiratory effort, Bilateral Air entry equal,Clear to Auscultation, no Crackles or wheezes Abdomen: soft, non-distended, non-tender, normal bowel sounds, no guarding or rebound tenderness Skin: no Rash Musculoskeletal:,  no clubbing / cyanosis. No joint deformity upper and lower extremities. Good ROM, no contractures. Normal muscle tone Neurologic: Grossly no focal neuro deficit.Mental status AAOx3, speech normal, Psychiatric:appropriate affect, and mood  Data Reviewed: CBC: Recent Labs  Lab 10/08/17 1741 10/09/17 0512 10/14/17 0333  WBC 10.9* 4.8 7.7  HGB 13.1 10.0* 9.3*  HCT 39.6 30.1* 29.2*  MCV 87.8 87.2 93.6  PLT 266 196 161   Basic Metabolic Panel: Recent Labs  Lab 10/08/17 1930 10/08/17 2329 10/09/17 0512  10/10/17 0217  10/10/17 1507 10/11/17 0621 10/12/17 0235 10/13/17 0517 10/14/17 0333  NA  --  137 141  --  141   < > 140 138 136 140 136  K  --  <2.0* 2.0*   < > 2.0*   < > 2.8* 2.7* 3.7 4.8 4.8  CL  --  95* 101  --  104   < > 107 103 105 109 108  CO2  --  29 29  --   28   < > 26 26 25  21* 21*  GLUCOSE  --  134* 92  --  105*   < > 98 94 104* 88 93  BUN  --  7 <5*  --  <5*   < > <5* <5* 5* 8 12  CREATININE  --  0.91 0.74  --  0.68   < > 0.63 0.63 0.71 0.72 0.81  CALCIUM  --  7.6* 7.1*  --  7.4*   < > 7.3* 7.8* 7.7* 8.4* 8.3*  MG 1.4* 2.1 2.1  --  1.6*  --   --  1.6* 2.2 1.7 1.5*  PHOS <1.0* 2.2* 4.8*  --   --   --   --  3.3  --   --   --    < > = values in this interval not displayed.   GFR: Estimated Creatinine Clearance: 47.2 mL/min (by C-G formula based on SCr of 0.81 mg/dL). Liver Function Tests: No results for input(s): AST, ALT, ALKPHOS, BILITOT, PROT, ALBUMIN in the last 168 hours. No results for input(s):  LIPASE, AMYLASE in the last 168 hours. No results for input(s): AMMONIA in the last 168 hours. Coagulation Profile: Recent Labs  Lab 10/13/17 0517  INR 0.96   Cardiac Enzymes: Recent Labs  Lab 10/12/17 0838 10/12/17 1415 10/12/17 2032  TROPONINI 0.07* 0.05* 0.03*   BNP (last 3 results) No results for input(s): PROBNP in the last 8760 hours. HbA1C: No results for input(s): HGBA1C in the last 72 hours. CBG: Recent Labs  Lab 10/13/17 1811 10/13/17 2009 10/14/17 0607 10/14/17 1229 10/14/17 1628  GLUCAP 82 90 79 99 103*   Lipid Profile: No results for input(s): CHOL, HDL, LDLCALC, TRIG, CHOLHDL, LDLDIRECT in the last 72 hours. Thyroid Function Tests: No results for input(s): TSH, T4TOTAL, FREET4, T3FREE, THYROIDAB in the last 72 hours. Anemia Panel: No results for input(s): VITAMINB12, FOLATE, FERRITIN, TIBC, IRON, RETICCTPCT in the last 72 hours. Urine analysis:    Component Value Date/Time   COLORURINE COLORLESS (A) 10/09/2017 2141   APPEARANCEUR CLEAR 10/09/2017 2141   LABSPEC 1.003 (L) 10/09/2017 2141   PHURINE 7.0 10/09/2017 2141   GLUCOSEU NEGATIVE 10/09/2017 2141   HGBUR LARGE (A) 10/09/2017 2141   BILIRUBINUR NEGATIVE 10/09/2017 2141   BILIRUBINUR negative 03/18/2016 1049   KETONESUR NEGATIVE 10/09/2017 2141     PROTEINUR NEGATIVE 10/09/2017 2141   UROBILINOGEN 0.2 03/18/2016 1049   UROBILINOGEN 0.2 09/12/2015 1225   NITRITE NEGATIVE 10/09/2017 2141   LEUKOCYTESUR NEGATIVE 10/09/2017 2141   Sepsis Labs: @LABRCNTIP (procalcitonin:4,lacticidven:4)  ) Recent Results (from the past 240 hour(s))  Surgical PCR screen     Status: None   Collection Time: 10/12/17  9:52 PM  Result Value Ref Range Status   MRSA, PCR NEGATIVE NEGATIVE Final   Staphylococcus aureus NEGATIVE NEGATIVE Final    Comment: (NOTE) The Xpert SA Assay (FDA approved for NASAL specimens in patients 57 years of age and older), is one component of a comprehensive surveillance program. It is not intended to diagnose infection nor to guide or monitor treatment.       Studies: No results found.  Scheduled Meds: . aspirin EC  81 mg Oral Daily  . carvedilol  3.125 mg Oral BID WC  . [START ON 10/15/2017] dicyclomine  10 mg Oral Q breakfast  . feeding supplement  1 Container Oral TID BM  . heparin  5,000 Units Subcutaneous Q8H  . linaclotide  145 mcg Oral QAC breakfast  . pantoprazole  40 mg Oral QPC breakfast  . potassium chloride  40 mEq Oral Daily  . protein supplement shake  2 oz Oral BID BM  . rosuvastatin  20 mg Oral Daily  . sodium chloride flush  3 mL Intravenous Q12H  . ticagrelor  90 mg Oral BID  . cyanocobalamin  1,000 mcg Oral Daily    Continuous Infusions: . sodium chloride       LOS: 6 days   Velvet Bathe, MD [facility] Pager [phone]  If 7PM-7AM, please contact night-coverage [email or web address] Password [facility] 10/14/2017, 6:31 PM

## 2017-10-14 NOTE — Progress Notes (Signed)
Progress Note  Patient Name: Erin [name] Date of Encounter: 10/14/2017  Primary Cardiologist: Dr. Fletcher Anon  Subjective   Patient is feeling well; denies chest pain, SOB, and palpitations. Pt wishes to discharge, but has not walked in halls yet.  Inpatient Medications    Scheduled Meds: . aspirin EC  81 mg Oral Daily  . dicyclomine  10 mg Oral TID AC  . feeding supplement  1 Container Oral TID BM  . heparin  5,000 Units Subcutaneous Q8H  . linaclotide  145 mcg Oral QAC breakfast  . pantoprazole  40 mg Oral QPC breakfast  . potassium chloride  40 mEq Oral BID  . protein supplement shake  2 oz Oral BID BM  . rosuvastatin  20 mg Oral Daily  . sodium chloride flush  3 mL Intravenous Q12H  . ticagrelor  90 mg Oral BID  . cyanocobalamin  1,000 mcg Oral Daily   Continuous Infusions: . sodium chloride     PRN Meds: sodium chloride, acetaminophen **OR** acetaminophen, alum & mag hydroxide-simeth, metoprolol tartrate, ondansetron (ZOFRAN) IV, oxyCODONE-acetaminophen, sodium chloride flush   Vital Signs    Vitals:   10/13/17 2200 10/14/17 0000 10/14/17 0200 10/14/17 0210  BP: 104/68 98/63  (!) 99/53  Pulse:  91  (!) 108  Resp: (!) 24 16  20   Temp:    98.2 F (36.8 C)  TempSrc:    Oral  SpO2: 100% 100%  100%  Weight:   90 lb 13.3 oz (41.2 kg)   Height:        Intake/Output Summary (Last 24 hours) at 10/14/2017 0834 Last data filed at 10/14/2017 0600 Gross per 24 hour  Intake 1138.75 ml  Output 1200 ml  Net -61.25 ml   Filed Weights   10/12/17 0616 10/13/17 0439 10/14/17 0200  Weight: 89 lb 3.2 oz (40.5 kg) 90 lb 1.6 oz (40.9 kg) 90 lb 13.3 oz (41.2 kg)     Physical Exam   General: Well developed, well nourished, female appearing in no acute distress. Head: Normocephalic, atraumatic.  Neck: Supple without bruits, no JVD Lungs:  Resp regular and unlabored, CTA. Heart: RRR, S1, S2, no S3, S4, or murmur; no rub. Abdomen: Soft, non-tender, non-distended with  normoactive bowel sounds. No hepatomegaly. No rebound/guarding. No obvious abdominal masses. Extremities: No clubbing, cyanosis, no edema. Distal pedal pulses are 2+ bilaterally. Extensive bruising on right arm and hand after failed right radial access. Bruising on left arm after failed left radial access and successful antecubital access. Neuro: Alert and oriented X 3. Moves all extremities spontaneously. Psych: Normal affect.  Labs    Chemistry Recent Labs  Lab 10/12/17 0235 10/13/17 0517 10/14/17 0333  NA 136 140 136  K 3.7 4.8 4.8  CL 105 109 108  CO2 25 21* 21*  GLUCOSE 104* 88 93  BUN 5* 8 12  CREATININE 0.71 0.72 0.81  CALCIUM 7.7* 8.4* 8.3*  GFRNONAA >60 >60 >60  GFRAA >60 >60 >60  ANIONGAP 6 10 7      Hematology Recent Labs  Lab 10/08/17 1741 10/09/17 0512 10/14/17 0333  WBC 10.9* 4.8 7.7  RBC 4.51 3.45* 3.12*  HGB 13.1 10.0* 9.3*  HCT 39.6 30.1* 29.2*  MCV 87.8 87.2 93.6  MCH 29.0 29.0 29.8  MCHC 33.1 33.2 31.8  RDW 14.3 14.6 16.5*  PLT 266 196 297    Cardiac Enzymes Recent Labs  Lab 10/12/17 0838 10/12/17 1415 10/12/17 2032  TROPONINI 0.07* 0.05* 0.03*    Recent  Labs  Lab 10/08/17 2029  TROPIPOC 0.01     BNPNo results for input(s): BNP, PROBNP in the last 168 hours.   DDimer No results for input(s): DDIMER in the last 168 hours.   Radiology    No results found.   Telemetry    Sinus  - Personally Reviewed  ECG    Sinus with TWI in V1-4 - Personally Reviewed  Cardiac Studies   Left heart cath 10/13/17: Conclusions: 1. Severe single vessel coronary artery disease with diffusely diseased RCA with eccentric aneurysm followed by tubular 99% stenosis in the mid vessel. 2. Low normal left ventricular filling pressure. 3. Mid anterior wall akinesis with otherwise preserved left ventricular contraction. LVEF 40-45%. 4. Successful PCI to mid RCA with placement of Resolute Onyx 2.0 x 12 mm drug-eluting stent (post-dilated to 2.3 mm) with  0% residual stenosis and TIMI-3 flow.  Recommendations: 1. Overnight observation; transfer to [facility]. 2. Dual antiplatelet therapy with aspirin and ticagrelor for at least 12 months. 3. Aggressive secondary prevention. 4. Remove left brachial artery sheath with manual compression once ACT has fallen below 170 seconds.   Myoveiw 08/09/17:  The left ventricular ejection fraction is moderately decreased (30-44%).  Nuclear stress EF calculated at 44% but visually appears 50-55%.  There was no ST segment deviation noted during stress.  The study is normal.  This is a low risk study.  Recommend 2D echo to assess LVF further.   Patient Profile     61 y.o. female Hx of PVOD, DM, HL and hypokalemia.  Hx of PVCs and prolonged QT  Prsents with "heaviness" with activity and SOB , chest tightness.   Assessment & Plan    1. Chest pain and T wave changes Given her multiple risk factors for ACS, including PVD, DM, HLD, prior smoking, family history, and sedentary lifestyle, heart cath was scheduled. Left heart catheterization on 10/13/17 with DES to RCA. Pt will discharge on DAPT of ASA and brilinta for at least 1 year. Please prescribe 3 month supply with 4 refills. She is hypotensive this morning and was unable to walk with PT secondary to lightheadedness/dizziness. Beta blocker has not been started yet for hypotension; as pressure improves pt needs to start on 12.5 mg toprol for activity-related tachycardia and marginal pressure. Continue crestor 20 mg daily.  2. Long QT EKG today with QTc of 492 ms, which is improved from 512ms on 10/12/17. Continue to avoid QT prolonging agents.   3. Hypokalemia K today is 4.8 on 40 mEq potassium BID with a creatinine of 0.81 and no diuretic regimen. Will decrease potassium supplementation to 40 mEq daily, which is her home dose. She was instructed to report to our office for BMP next week.   4. New onset systolic heart failure Myoview 07/2017 with visually  normal EF of 50-55%. Heart cath estimated EF at 40-45%. Recommend follow up echo in 3 months. She was instructed to log daily weights. She is currently overall net negative 3.7L with 1.2 L urine output yesterday with weight of 90 lbs. Will not prescribe diuretic regimen as she is euvolemic on exam.     Signed, Ledora Bottcher , PA-C 8:34 AM 10/14/2017 Pager: [phone]  Patient examined chart reviewed. Exam benign BP better now will try to add low dose coreg 3.125 bid She is ok with staying until we make sure she can ambulate without hypotension and on low dose Beta blocker for CAD and tachycardia  Jenkins Rouge

## 2017-10-14 NOTE — Progress Notes (Signed)
CARDIAC REHAB PHASE I   PRE:  Rate/Rhythm: Sinus Tach 104  BP:  Supine: 96/66    Standing: 103/63   SaO2: 99% Room Air  MODE:  Ambulation: 0 ft   POST:  Rate/Rhythem: 93  BP:  Supine: 114/62    [id number] Upon standing Mrs Hardiman up to walk in the hallway, patient reported feeling lightheaded. Telemetry rhythm Sinus Tach rate 138. " I have to sit back down." Patient's RN notified. Patient was not ambulated in the hallway. Patient given information on heart healthy diet,discussed the importance of taking her brillinta as prescribed. Discussed the use of sublingual nitroglycerin and when to call 911. Patient given information about phase 2 cardiac rehab. Patient says her husband took her stent card home yesterday.   Harrell Gave RN, BSN

## 2017-10-15 DIAGNOSIS — Z955 Presence of coronary angioplasty implant and graft: Secondary | ICD-10-CM

## 2017-10-15 DIAGNOSIS — E7849 Other hyperlipidemia: Secondary | ICD-10-CM

## 2017-10-15 DIAGNOSIS — I25118 Atherosclerotic heart disease of native coronary artery with other forms of angina pectoris: Secondary | ICD-10-CM

## 2017-10-15 DIAGNOSIS — R000 Tachycardia, unspecified: Secondary | ICD-10-CM

## 2017-10-15 DIAGNOSIS — I5021 Acute systolic (congestive) heart failure: Secondary | ICD-10-CM

## 2017-10-15 DIAGNOSIS — E119 Type 2 diabetes mellitus without complications: Secondary | ICD-10-CM

## 2017-10-15 LAB — GLUCOSE, CAPILLARY
Glucose-Capillary: 110 mg/dL — ABNORMAL HIGH (ref 65–99)
Glucose-Capillary: 105 mg/dL — ABNORMAL HIGH (ref 65–99)

## 2017-10-15 MED ORDER — METOPROLOL SUCCINATE ER 25 MG PO TB24
25.0000 mg | ORAL_TABLET | Freq: Two times a day (BID) | ORAL | Status: DC
  Administered 2017-10-15 – 2017-10-16 (×3): 25 mg via ORAL
  Filled 2017-10-15 (×3): qty 1

## 2017-10-15 NOTE — Progress Notes (Addendum)
Progress Note  Patient Name: Erin [name] Date of Encounter: 10/15/2017  Primary Cardiologist: Dr. Fletcher Anon  Subjective   She denies chest pain.  She says she is short of breath with exertion and even while at rest.  She also feels very irritable this morning.  She does not wish to be discharged.  Inpatient Medications    Scheduled Meds: . aspirin EC  81 mg Oral Daily  . carvedilol  3.125 mg Oral BID WC  . dicyclomine  10 mg Oral Q breakfast  . feeding supplement  1 Container Oral TID BM  . heparin  5,000 Units Subcutaneous Q8H  . linaclotide  145 mcg Oral QAC breakfast  . pantoprazole  40 mg Oral QPC breakfast  . potassium chloride  40 mEq Oral Daily  . protein supplement shake  2 oz Oral BID BM  . rosuvastatin  20 mg Oral Daily  . sodium chloride flush  3 mL Intravenous Q12H  . ticagrelor  90 mg Oral BID  . cyanocobalamin  1,000 mcg Oral Daily   Continuous Infusions: . sodium chloride     PRN Meds: sodium chloride, acetaminophen **OR** acetaminophen, alum & mag hydroxide-simeth, ondansetron (ZOFRAN) IV, oxyCODONE-acetaminophen, sodium chloride flush   Vital Signs    Vitals:   10/14/17 2349 10/15/17 0349 10/15/17 0537 10/15/17 1046  BP: 123/67  (!) 114/58 (!) 98/56  Pulse: (!) 104  (!) 110 (!) 101  Resp: 18  18 18   Temp: 99.1 F (37.3 C)  99.2 F (37.3 C) 98 F (36.7 C)  TempSrc: Oral  Oral Oral  SpO2: 100%  98% 100%  Weight:  88 lb 11.2 oz (40.2 kg)    Height:        Intake/Output Summary (Last 24 hours) at 10/15/2017 1054 Last data filed at 10/15/2017 0944 Gross per 24 hour  Intake 1020 ml  Output 2700 ml  Net -1680 ml   Filed Weights   10/14/17 0200 10/14/17 1133 10/15/17 0349  Weight: 90 lb 13.3 oz (41.2 kg) 90 lb 4.8 oz (41 kg) 88 lb 11.2 oz (40.2 kg)    Telemetry    Sinus tachycardia- Personally Reviewed  ECG    n/a - Personally Reviewed  Physical Exam   GEN: No acute distress.  Anxious. Neck: No JVD Cardiac: Mildly tachycardic,  no murmurs, rubs, or gallops.  Respiratory: Clear to auscultation bilaterally. GI: Soft, nontender, non-distended  MS: No edema; No deformity. Neuro:  Nonfocal  Psych: Anxious.  Labs    Chemistry Recent Labs  Lab 10/12/17 0235 10/13/17 0517 10/14/17 0333  NA 136 140 136  K 3.7 4.8 4.8  CL 105 109 108  CO2 25 21* 21*  GLUCOSE 104* 88 93  BUN 5* 8 12  CREATININE 0.71 0.72 0.81  CALCIUM 7.7* 8.4* 8.3*  GFRNONAA >60 >60 >60  GFRAA >60 >60 >60  ANIONGAP 6 10 7      Hematology Recent Labs  Lab 10/08/17 1741 10/09/17 0512 10/14/17 0333  WBC 10.9* 4.8 7.7  RBC 4.51 3.45* 3.12*  HGB 13.1 10.0* 9.3*  HCT 39.6 30.1* 29.2*  MCV 87.8 87.2 93.6  MCH 29.0 29.0 29.8  MCHC 33.1 33.2 31.8  RDW 14.3 14.6 16.5*  PLT 266 196 297    Cardiac Enzymes Recent Labs  Lab 10/12/17 0838 10/12/17 1415 10/12/17 2032  TROPONINI 0.07* 0.05* 0.03*    Recent Labs  Lab 10/08/17 2029  TROPIPOC 0.01     BNPNo results for input(s): BNP, PROBNP in the  last 168 hours.   DDimer No results for input(s): DDIMER in the last 168 hours.   Radiology    No results found.  Cardiac Studies   Left heart cath 10/13/17: Conclusions: 1. Severe single vessel coronary artery disease with diffusely diseased RCA with eccentric aneurysm followed by tubular 99% stenosis in the mid vessel. 2. Low normal left ventricular filling pressure. 3. Mid anterior wall akinesis with otherwise preserved left ventricular contraction. LVEF 40-45%. 4. Successful PCI to mid RCA with placement of Resolute Onyx 2.0 x 12 mm drug-eluting stent (post-dilated to 2.3 mm) with 0% residual stenosis and TIMI-3 flow.    Patient Profile     61 y.o. female with a history of PVOD, DM, hyperlipidemia, hypokalemia, PVCs and prolonged QT who presented with "heaviness" with activity and SOB , as well as chest tightness.   Assessment & Plan    1. Chest pain with ECG abnormalities s/p PCI of the mid RCA with a drug-eluting stent:  Symptomatically improved.  She will be on dual antiplatelet therapy with aspirin and Brilinta for at least one year.  She remains tachycardic.  Low-dose carvedilol was started yesterday.  I will switch to metoprolol succinate 25 mg twice daily as this may have less of an effect on blood pressure so as not to potentiate hypotension.  Continue Crestor 20 mg daily.  2.  Long QT: ECG yesterday had a QTC of 492 ms which had improved from 522 ms on 10/12/17.  Continue to avoid QT prolonging agents.  3.  Hypokalemia: On replacement therapy.  Potassium was normal at 4.8 yesterday.  4.  New onset systolic heart failure/cardiomyopathy, LVEF 40-45% by left ventriculography: She is on a beta-blocker.  Unable to add an ACE inhibitor due to hypotension.  She is euvolemic on exam today.  No diuretics are indicated.  5. Hyperlipidemia: On Crestor 20 mg.   For questions or updates, please contact [place] Please consult [email or web address] for contact info under Cardiology/STEMI.      Signed, Kate Sable, MD  10/15/2017, 10:54 AM

## 2017-10-15 NOTE — Progress Notes (Signed)
PROGRESS NOTE  Erin [name] FUX:[id number] DOB: Aug 01, 1956 DOA: 10/08/2017 PCP: Erin Kern, DO  HPI/Recap of past 24 hours:  Erin [name] is a 61 y.o. year old female with medical history significant for T2DM, Hypothyroidism, GERD, IBS, chronic N/V, hx Anal Ca, Depression, Anxiety, Neurodermatitis, Vertigo, HAs and B12 def , recently hospitalization 07/2017 for PAD s/p aortobifem and bilateral femoral-popliteal bypasses who presented on 10/08/2017 with generalized cramping weakness and was found to have severe hypokalemia.  Patient has a long history of severe hypokalemia requiring repletion.  Previous workup has been unrevealing.  Of note patient was recently hospitalized and discharged from 08/14/2017-9/30 /2018 for critical limb ischemia requiring right aortobifemoral bypass and bilateral femoral-popliteal bypasses.  Patient states since that hospitalization she has been off her oral potassium.  Patient reports experiencing worsening nausea and generalized weakness and cramps for the past 4 days.Upon presentation to the ED, patient's vital signs are notable for tachypnea 28, tachycardic to 132, and blood pressure range 86/76-140 4/81.  Patient was found to have a lactic acid of 2.25.  Mag 1.4 phosphorus less than 1, potassium less than 2.  BMP notable for chloride of 90, BUN less than 5, creatinine 1.  Other lab work notable for WBC: 10.9.  Patient was given potassium IV 10 mEq x4, oral potassium 40 mEq, and IV magnesium 2 g x2.  Patient's lactic acid improved and resolved after bolus of normal saline.  Patient's phosphate was also repleted with IV potassium phosphate 30 mmol.  Patient was admitted to the hospitalist service for further management.  This morning patient reports improvement in cramps.  Also notes improvement in strength.  Patient's husband is at bedside.  Patient denies any new medications. Patient denies any use of diuretics. She does report 2 episodes of dry heaving but  no emesis.  Assessment/Plan: Principal Problem:   CAD (coronary artery disease), native coronary artery Active Problems:   IBS (irritable bowel syndrome)   Chronic daily headache   Anal cancer ([place])   Type 2 diabetes mellitus without complication, without long-term current use of insulin (HCC)   Hypokalemia   Hypophosphatemia   Hypomagnesemia   Spasms of the hands or feet   Unstable angina (HCC)   Multiple electrolyte imbalances Severe symptomatic Hypokalemia, stable Hypomagnesemia, resolved Hypophosphatemia, resolved Resolved on last check  Chronic intractable nausea/vomiting and bloating Ongoing for several years, usually occurring hours after meal.  Previously evaluated by GI with EGD in 2016 which was significant for gastritis.  Gastric emptying scan was discussed but was not pursued.  Possible gastric outlet syndrome versus gastroparesis.  Would be surprising given A1c of 5.3 (02/2017), would expect in poorly controlled diabetic A1c Will likely need gastric empty study as outpatient IV Zofran PRN  Inverted T wave - Pt is s/p L heart cath.  - Pt is s/p stent placement to DES to RCA 10/13/17 - Pt was started on B blocker carvedilol but developed issues with blood pressure and was complaining of dizziness. Pt changed from carvedilol and placed on metoprolol.   Lactic acidosis, resolved Resolved with fluid resuscitation  Hypothyroidism Last TSH in 05/11/2017 normal at 2.34.  Profound weakness in the setting of hypokalemia.  Though could be contributory to hypothyroidism if poorly controlled. TSH within normal limits  Type 2 diabetes, diet-controlled A1c 5.3 (08/14/2017) Monitor closely, Continue SSI regimen.  Hyperlipidemia LDL 217 (03/09/2015) Stable on statin  Code Status: Full code  Family Communication: Discussed with husband and patient at bedside  Disposition Plan: Normalization of potassium with IV and oral repletion.  Monitor over the next 24 hours, likely  discharge 10/10/2017.   Consultants:  None  Procedures:  None   Antimicrobials:  None   Cultures:  None  DVT prophylaxis: SCDs   Objective: Vitals:   10/15/17 0349 10/15/17 0537 10/15/17 1046 10/15/17 1118  BP:  (!) 114/58 (!) 98/56 100/60  Pulse:  (!) 110 (!) 101 (!) 110  Resp:  18 18   Temp:  99.2 F (37.3 C) 98 F (36.7 C)   TempSrc:  Oral Oral   SpO2:  98% 100%   Weight: 40.2 kg (88 lb 11.2 oz)     Height:        Intake/Output Summary (Last 24 hours) at 10/15/2017 1149 Last data filed at 10/15/2017 0944 Gross per 24 hour  Intake 1020 ml  Output 2700 ml  Net -1680 ml   Filed Weights   10/14/17 0200 10/14/17 1133 10/15/17 0349  Weight: 41.2 kg (90 lb 13.3 oz) 41 kg (90 lb 4.8 oz) 40.2 kg (88 lb 11.2 oz)    Exam: Exam unchanged when compared to prior on 11/24  General: Lying in bed in Appear in no distress Eyes: anicteric ENT: Oral Mucosa clear ,moist,dry. Neck: no JVD Cardiovascular: regular rate and rhythm, no Murmurs, rubs or gallops, peripheral Pulses Present,  no edema Respiratory: Normal respiratory effort, Bilateral Air entry equal,Clear to Auscultation, no Crackles or wheezes Abdomen: soft, non-distended, non-tender, normal bowel sounds, no guarding or rebound tenderness Skin: no Rash Musculoskeletal:,  no clubbing / cyanosis. No joint deformity upper and lower extremities. Good ROM, no contractures. Normal muscle tone Neurologic: Grossly no focal neuro deficit.Mental status AAOx3, speech normal, Psychiatric:appropriate affect, and mood  Data Reviewed: CBC: Recent Labs  Lab 10/08/17 1741 10/09/17 0512 10/14/17 0333  WBC 10.9* 4.8 7.7  HGB 13.1 10.0* 9.3*  HCT 39.6 30.1* 29.2*  MCV 87.8 87.2 93.6  PLT 266 196 130   Basic Metabolic Panel: Recent Labs  Lab 10/08/17 1930 10/08/17 2329 10/09/17 0512  10/10/17 0217  10/10/17 1507 10/11/17 0621 10/12/17 0235 10/13/17 0517 10/14/17 0333  NA  --  137 141  --  141   < > 140 138  136 140 136  K  --  <2.0* 2.0*   < > 2.0*   < > 2.8* 2.7* 3.7 4.8 4.8  CL  --  95* 101  --  104   < > 107 103 105 109 108  CO2  --  29 29  --  28   < > 26 26 25  21* 21*  GLUCOSE  --  134* 92  --  105*   < > 98 94 104* 88 93  BUN  --  7 <5*  --  <5*   < > <5* <5* 5* 8 12  CREATININE  --  0.91 0.74  --  0.68   < > 0.63 0.63 0.71 0.72 0.81  CALCIUM  --  7.6* 7.1*  --  7.4*   < > 7.3* 7.8* 7.7* 8.4* 8.3*  MG 1.4* 2.1 2.1  --  1.6*  --   --  1.6* 2.2 1.7 1.5*  PHOS <1.0* 2.2* 4.8*  --   --   --   --  3.3  --   --   --    < > = values in this interval not displayed.   GFR: Estimated Creatinine Clearance: 46.3 mL/min (by C-G formula based on SCr of  0.81 mg/dL). Liver Function Tests: No results for input(s): AST, ALT, ALKPHOS, BILITOT, PROT, ALBUMIN in the last 168 hours. No results for input(s): LIPASE, AMYLASE in the last 168 hours. No results for input(s): AMMONIA in the last 168 hours. Coagulation Profile: Recent Labs  Lab 10/13/17 0517  INR 0.96   Cardiac Enzymes: Recent Labs  Lab 10/12/17 0838 10/12/17 1415 10/12/17 2032  TROPONINI 0.07* 0.05* 0.03*   BNP (last 3 results) No results for input(s): PROBNP in the last 8760 hours. HbA1C: No results for input(s): HGBA1C in the last 72 hours. CBG: Recent Labs  Lab 10/13/17 2009 10/14/17 0607 10/14/17 1229 10/14/17 1628 10/14/17 2146  GLUCAP 90 79 99 103* 118*   Lipid Profile: No results for input(s): CHOL, HDL, LDLCALC, TRIG, CHOLHDL, LDLDIRECT in the last 72 hours. Thyroid Function Tests: No results for input(s): TSH, T4TOTAL, FREET4, T3FREE, THYROIDAB in the last 72 hours. Anemia Panel: No results for input(s): VITAMINB12, FOLATE, FERRITIN, TIBC, IRON, RETICCTPCT in the last 72 hours. Urine analysis:    Component Value Date/Time   COLORURINE COLORLESS (A) 10/09/2017 2141   APPEARANCEUR CLEAR 10/09/2017 2141   LABSPEC 1.003 (L) 10/09/2017 2141   PHURINE 7.0 10/09/2017 2141   GLUCOSEU NEGATIVE 10/09/2017 2141    HGBUR LARGE (A) 10/09/2017 2141   BILIRUBINUR NEGATIVE 10/09/2017 2141   BILIRUBINUR negative 03/18/2016 1049   KETONESUR NEGATIVE 10/09/2017 2141   PROTEINUR NEGATIVE 10/09/2017 2141   UROBILINOGEN 0.2 03/18/2016 1049   UROBILINOGEN 0.2 09/12/2015 1225   NITRITE NEGATIVE 10/09/2017 2141   LEUKOCYTESUR NEGATIVE 10/09/2017 2141   Sepsis Labs: @LABRCNTIP (procalcitonin:4,lacticidven:4)  ) Recent Results (from the past 240 hour(s))  Surgical PCR screen     Status: None   Collection Time: 10/12/17  9:52 PM  Result Value Ref Range Status   MRSA, PCR NEGATIVE NEGATIVE Final   Staphylococcus aureus NEGATIVE NEGATIVE Final    Comment: (NOTE) The Xpert SA Assay (FDA approved for NASAL specimens in patients 88 years of age and older), is one component of a comprehensive surveillance program. It is not intended to diagnose infection nor to guide or monitor treatment.       Studies: No results found.  Scheduled Meds: . aspirin EC  81 mg Oral Daily  . dicyclomine  10 mg Oral Q breakfast  . feeding supplement  1 Container Oral TID BM  . heparin  5,000 Units Subcutaneous Q8H  . linaclotide  145 mcg Oral QAC breakfast  . metoprolol succinate  25 mg Oral BID  . pantoprazole  40 mg Oral QPC breakfast  . potassium chloride  40 mEq Oral Daily  . protein supplement shake  2 oz Oral BID BM  . rosuvastatin  20 mg Oral Daily  . sodium chloride flush  3 mL Intravenous Q12H  . ticagrelor  90 mg Oral BID  . cyanocobalamin  1,000 mcg Oral Daily    Continuous Infusions: . sodium chloride       LOS: 7 days   Velvet Bathe, MD [facility] Pager [phone]  If 7PM-7AM, please contact night-coverage [email or web address] Password [facility] 10/15/2017, 11:49 AM

## 2017-10-15 NOTE — Progress Notes (Signed)
Patient called RN to the room, said she is feeling sick and dizzzy, vitals taken and stable, IV Zofran given, no any complain of pain and SOB, RN told her to call anytime if she feels something not good, right now resting in bed,  [place] given to drink, Husband in bed side, will inform MD about situation  Neil Crouch. RN

## 2017-10-15 NOTE — Progress Notes (Signed)
Pt is stable after that morning episode of dizziness, vitals stable, eating and drinking well, no any other complaints of SOB and pain, will continue to monitor  [facility]

## 2017-10-15 NOTE — Plan of Care (Signed)
  Progressing Activity: Ability to return to baseline activity level will improve 10/15/2017 0411 - Progressing by Ruben Im, RN

## 2017-10-16 ENCOUNTER — Encounter (HOSPITAL_COMMUNITY): Payer: Self-pay | Admitting: Internal Medicine

## 2017-10-16 ENCOUNTER — Other Ambulatory Visit: Payer: Self-pay | Admitting: Physician Assistant

## 2017-10-16 DIAGNOSIS — I5181 Takotsubo syndrome: Secondary | ICD-10-CM

## 2017-10-16 DIAGNOSIS — I2510 Atherosclerotic heart disease of native coronary artery without angina pectoris: Secondary | ICD-10-CM

## 2017-10-16 LAB — POCT ACTIVATED CLOTTING TIME: ACTIVATED CLOTTING TIME: 164 s

## 2017-10-16 LAB — GLUCOSE, CAPILLARY: GLUCOSE-CAPILLARY: 91 mg/dL (ref 65–99)

## 2017-10-16 MED ORDER — ADULT MULTIVITAMIN W/MINERALS CH: 1.0000 | ORAL_TABLET | Freq: Every day | ORAL | Status: DC

## 2017-10-16 MED ORDER — METOPROLOL SUCCINATE ER 25 MG PO TB24: 25.0000 mg | ORAL_TABLET | Freq: Two times a day (BID) | ORAL | 0 refills | Status: DC

## 2017-10-16 MED ORDER — PRO-STAT SUGAR FREE PO LIQD: 30.0000 mL | Freq: Two times a day (BID) | ORAL | Status: DC

## 2017-10-16 MED FILL — Verapamil HCl IV Soln 2.5 MG/ML: INTRAVENOUS | Qty: 2 | Status: AC

## 2017-10-16 NOTE — Progress Notes (Signed)
Dr. Sallyanne Kuster asked me to arrange f/u. Pt requests f/u with Dr. Saunders Revel who did cath this admission. The following appts were scheduled and added to AVS:  - 2D echo 10/23/17 at 11:30am - Dr. Saunders Revel 12/13 at 3:20pm   Also sent message to Dr. Jose Persia. End requesting permission to switch primary cardiologist to Dr. Saunders Revel. Dr.Croitoru recommends she keep Dr. Fletcher Anon as provider for PAD.  Becky Berberian PA-C

## 2017-10-16 NOTE — Progress Notes (Signed)
CARDIAC REHAB PHASE I   PRE:  Rate/Rhythm: 47 SR  BP:  Supine:   Sitting: 121/77  Standing:    SaO2: 99%RA  MODE:  Ambulation: 420 ft   POST:  Rate/Rhythm: 116 ST  Then 91 SR with rest  BP:  Supine:   Sitting: 144/85  Standing:    SaO2: 100%RA 0935-1000 Pt walked 420 ft on RA with hand held asst. Gait fairly steady. A little wobbly at times but has a fast pace. Denied falls at home. No CP. Felt good per pt to be up.   Graylon Good, RN BSN  10/16/2017 9:57 AM

## 2017-10-16 NOTE — Progress Notes (Signed)
CM talked to patient about Brilinta/ coupon card; spouse stated that he has already picked the medication up from the pharmacy with zero co pay; Aneta Mins [phone]

## 2017-10-16 NOTE — Progress Notes (Signed)
Progress Note  Patient Name: Erin [name] Date of Encounter: 10/16/2017  Primary Cardiologist: Dr. Saunders Revel - New  Subjective   Says she is feeling great today, free of chest pain or shortness of breath   Inpatient Medications    Scheduled Meds: . aspirin EC  81 mg Oral Daily  . dicyclomine  10 mg Oral Q breakfast  . feeding supplement (PRO-STAT SUGAR FREE 64)  30 mL Oral BID  . heparin  5,000 Units Subcutaneous Q8H  . linaclotide  145 mcg Oral QAC breakfast  . metoprolol succinate  25 mg Oral BID  . multivitamin with minerals  1 tablet Oral Daily  . pantoprazole  40 mg Oral QPC breakfast  . potassium chloride  40 mEq Oral Daily  . rosuvastatin  20 mg Oral Daily  . sodium chloride flush  3 mL Intravenous Q12H  . ticagrelor  90 mg Oral BID  . cyanocobalamin  1,000 mcg Oral Daily   Continuous Infusions: . sodium chloride     PRN Meds: sodium chloride, acetaminophen **OR** acetaminophen, alum & mag hydroxide-simeth, ondansetron (ZOFRAN) IV, oxyCODONE-acetaminophen, sodium chloride flush   Vital Signs    Vitals:   10/15/17 2000 10/16/17 0200 10/16/17 0439 10/16/17 1040  BP: 119/74  110/69 113/72  Pulse: (!) 104  93 86  Resp: 18  18   Temp: 98.5 F (36.9 C)  98.5 F (36.9 C)   TempSrc: Oral  Oral   SpO2: 100%  99%   Weight:  86 lb 12.8 oz (39.4 kg)    Height:        Intake/Output Summary (Last 24 hours) at 10/16/2017 1452 Last data filed at 10/16/2017 0200 Gross per 24 hour  Intake 340 ml  Output 1300 ml  Net -960 ml   Filed Weights   10/14/17 1133 10/15/17 0349 10/16/17 0200  Weight: 90 lb 4.8 oz (41 kg) 88 lb 11.2 oz (40.2 kg) 86 lb 12.8 oz (39.4 kg)    Telemetry    Sinus rhythm - Personally Reviewed  ECG    Sinus rhythm, HR 90, QTc 482, LVH - Personally Reviewed  Physical Exam   GEN: appears comfortable, sitting up in bed speaking with husband, No acute distress.   Neck: No JVD Cardiac: RRR, no murmurs, rubs, or gallops. Difficult to  auscultate with cachexia.  Respiratory: Clear to auscultation bilaterally. GI: Soft, nontender, non-distended  MS: No edema; No deformity, incision sites over b/l lower extremities are well healing, bruising over b/l upper extremities with cath site dressings clean dry and intact  Neuro:  Nonfocal  Psych: Normal affect   Labs    Chemistry Recent Labs  Lab 10/12/17 0235 10/13/17 0517 10/14/17 0333  NA 136 140 136  K 3.7 4.8 4.8  CL 105 109 108  CO2 25 21* 21*  GLUCOSE 104* 88 93  BUN 5* 8 12  CREATININE 0.71 0.72 0.81  CALCIUM 7.7* 8.4* 8.3*  GFRNONAA >60 >60 >60  GFRAA >60 >60 >60  ANIONGAP 6 10 7      Hematology Recent Labs  Lab 10/14/17 0333  WBC 7.7  RBC 3.12*  HGB 9.3*  HCT 29.2*  MCV 93.6  MCH 29.8  MCHC 31.8  RDW 16.5*  PLT 297    Cardiac Enzymes Recent Labs  Lab 10/12/17 0838 10/12/17 1415 10/12/17 2032  TROPONINI 0.07* 0.05* 0.03*   No results for input(s): TROPIPOC in the last 168 hours.   BNPNo results for input(s): BNP, PROBNP in the last 168  hours.   DDimer No results for input(s): DDIMER in the last 168 hours.   Radiology    No results found.  Cardiac Studies   Cardiac catheterization 10/13/2017  1. Severe single vessel coronary artery disease with diffusely diseased RCA with eccentric aneurysm followed by tubular 99% stenosis in the mid vessel. 2. Low normal left ventricular filling pressure. 3. Mid anterior wall akinesis with otherwise preserved left ventricular contraction. LVEF 40-45%. 4. Successful PCI to mid RCA with placement of Resolute Onyx 2.0 x 12 mm drug-eluting stent (post-dilated to 2.3 mm) with 0% residual stenosis and TIMI-3 flow.  Recommendations: 1. Overnight observation; transfer to [facility]. 2. Dual antiplatelet therapy with aspirin and ticagrelor for at least 12 months. 3. Aggressive secondary prevention.  Patient Profile     61 y.o. female with PMH anal CA s/p radiation, PAD with aortibifem and fem pop bypass, T2  DM who presented with crushing substernal chest pain and EKG changes with lateral MI and was found to have 99% RCA  stenosis with a segment of anterior wall hypokinesia on cath.   Assessment & Plan    Coronary artery disease - Up and walking around the rounds today without chest pain or difficulty breathing. Will need at least a year of DAPT, already has ASA and brilinta Rx at home. Would continue metoprolol succinate 25 mg BID at home. Will follow up with Dr. Saunders Revel.   HLD - Last LDL 217 in 2016, now on Coreg 20 mg daily, this will need to be rechecked in the next month outpatient.   QT prolongation - resolved, avoid QT prolonging medications, would not continue zofran at discharge   New systolic CHF - Would repeat echo in 2 weeks outpatient, question if there may be a component of stress cardiomyopathy.   For questions or updates, please contact [place] Please consult [email or web address] for contact info under Cardiology/STEMI.      Signed, Ledell Noss, MD  10/16/2017, 2:52 PM    I have seen and examined the patient along with Ledell Noss, MD.  I have reviewed the chart, notes and new data.  I agree with her note.  Key new complaints: no CV complaints, appetite is back, walked in hallway Key examination changes: ecchymoses bilateral wrists and L elbow, no hematoma Key new findings / data: K 4.8. ECG with evolving anterior changes and improving QT prolongation - pattern is consistent with LAD ischemia or takotsubo sd. 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  PLAN: Suspect she has both true CAD (now s/p RCA stent), but that her ECG changes and wall motion abnormalities are due to transient stress cardiomyopathy, expected to completely resolve in 1-2 weeks. Plan outpatient echo and follow up - she would like to see Dr. Saunders Revel, who did her cath/stent. OK for DC today from our point of view.  Sanda Klein, MD,  [place] [phone] 10/16/2017, 3:05 PM

## 2017-10-16 NOTE — Plan of Care (Signed)
  Progressing Education: Understanding of CV disease, CV risk reduction, and recovery process will improve 10/16/2017 0052 - Progressing by Ruben Im, RN Health Behavior/Discharge Planning: Ability to safely manage health-related needs after discharge will improve 10/16/2017 0052 - Progressing by Ruben Im, RN

## 2017-10-16 NOTE — Discharge Summary (Signed)
Physician Discharge Summary  Erin [name] SNK:[id number] DOB: 1956/04/04 DOA: 10/08/2017  PCP: Lucretia Kern, DO  Admit date: 10/08/2017 Discharge date: 10/16/2017  Time spent: > 35 minutes  Recommendations for Outpatient Follow-up:  1. Ensure patient follows up with cardiologist 2. Pt needs to be on dual antiplatelet therapy for the next year with aspirin Brilinta 3. Monitor potassium levels   Discharge Diagnoses:  Principal Problem:   CAD (coronary artery disease), native coronary artery Active Problems:   IBS (irritable bowel syndrome)   Chronic daily headache   Anal cancer (HCC)   Type 2 diabetes mellitus without complication, without long-term current use of insulin (HCC)   Hypokalemia   Hypophosphatemia   Hypomagnesemia   Spasms of the hands or feet   Unstable angina [facility])   Discharge Condition: Stable  Diet recommendation: Heart healthy  Filed Weights   10/14/17 1133 10/15/17 0349 10/16/17 0200  Weight: 41 kg (90 lb 4.8 oz) 40.2 kg (88 lb 11.2 oz) 39.4 kg (86 lb 12.8 oz)    History of present illness:  61 y.o. year old female with medical history significant for T2DM, Hypothyroidism, GERD, IBS, chronic N/V, hx Anal Ca, Depression, Anxiety, Neurodermatitis, Vertigo, HAs and B12 def , recently hospitalization 07/2017 for PAD s/p aortobifem and bilateral femoral-popliteal bypasses who presented on 10/08/2017 with generalized cramping weakness and was found to have severe hypokalemia.  Also found to have coronary artery disease status post PCI to [place] Hospital Course:  Hypokalemia/ hypomagnesemia - Resolved after replacement  Coronary artery disease: status post stent to RCA - Cardiology evaluated and recommended the following: Suspect she has both true CAD (now s/p RCA stent), but that her ECG changes and wall motion abnormalities are due to transient stress cardiomyopathy, expected to completely resolve in 1-2 weeks. Plan outpatient echo and follow up - she  would like to see Dr. Saunders Revel, who did her cath/stent. OK for DC today from our point of view.   Procedures: Cardiac catheterization 10/13/2017  1. Severe single vessel coronary artery disease with diffusely diseased RCA with eccentric aneurysm followed by tubular 99% stenosis in the mid vessel. 2. Low normal left ventricular filling pressure. 3. Mid anterior wall akinesis with otherwise preserved left ventricular contraction. LVEF 40-45%. 4. Successful PCI to mid RCA with placement of Resolute Onyx 2.0 x 12 mm drug-eluting stent (post-dilated to 2.3 mm) with 0% residual stenosis and TIMI-3 flow.  Consultations:  Cardiology  Discharge Exam: Vitals:   10/16/17 0439 10/16/17 1040  BP: 110/69 113/72  Pulse: 93 86  Resp: 18   Temp: 98.5 F (36.9 C)   SpO2: 99%     General: Pt in nad, alert and awake Cardiovascular: rrr, no rubs Respiratory: no increased wob, no wheezes  Discharge Instructions   Discharge Instructions    Amb Referral to Cardiac Rehabilitation   Complete by:  As directed    Diagnosis:  Coronary Stents   Call MD for:  severe uncontrolled pain   Complete by:  As directed    Call MD for:  temperature >100.4   Complete by:  As directed    Diet - low sodium heart healthy   Complete by:  As directed    Discharge instructions   Complete by:  As directed    Please follow-up with your primary care physician within the next one or 2 weeks or sooner should any new concerns arise   Increase activity slowly   Complete by:  As directed  Current Discharge Medication List    START taking these medications   Details  metoprolol succinate (TOPROL-XL) 25 MG 24 hr tablet Take 1 tablet (25 mg total) by mouth 2 (two) times daily. Qty: 60 tablet, Refills: 0    ticagrelor (BRILINTA) 90 MG TABS tablet Take 1 tablet (90 mg total) by mouth 2 (two) times daily. Qty: 60 tablet, Refills: 0      CONTINUE these medications which have NOT CHANGED   Details  acetaminophen  (TYLENOL) 500 MG tablet Take 500 mg every 6 (six) hours as needed by mouth for headache (pain).    acyclovir ointment (ZOVIRAX) 5 % Apply 1 application topically every 3 (three) hours. Qty: 15 g, Refills: 1    aspirin EC 81 MG tablet Take 81 mg by mouth daily.    Biotin 10000 MCG TABS Take 10,000 mcg by mouth daily.    dicyclomine (BENTYL) 10 MG capsule TAKE ONE CAPSULE BY MOUTH THREE TIMES DAILY BEFORE MEALS. DUE FOR FOLLOW UP IN MAY, NEEDS TO SCHEDULE AN APPOINTMENT Qty: 270 capsule, Refills: 1   Associated Diagnoses: Intractable vomiting with nausea; Gastritis    fluticasone (FLONASE) 50 MCG/ACT nasal spray USE 1 SPRAY IN EACH NOSTRIL DAILY Qty: 16 g, Refills: 5    levothyroxine (SYNTHROID, LEVOTHROID) 75 MCG tablet TAKE 1 TABLET BY MOUTH DAILY. GENERIC EQUIVALENT FOR SYNTHROID Qty: 90 tablet, Refills: 1    LINZESS 145 MCG CAPS capsule TAKE ONE CAPSULE BY MOUTH DAILY BEFORE BREAKFAST Qty: 90 capsule, Refills: 3    meclizine (ANTIVERT) 12.5 MG tablet TAKE 1 TABLET (12.5 MG TOTAL) BY MOUTH 3 (THREE) TIMES DAILY AS NEEDED FOR DIZZINESS. Qty: 30 tablet, Refills: 0    Multiple Vitamins-Minerals (PRESERVISION AREDS 2 PO) Take 1 capsule by mouth daily.    nystatin-triamcinolone (MYCOLOG II) cream APPLY TO CORNERS OF THE MOUTH TWICE A DAY AS NEEDED FOR CRACKING/BURNING Refills: 2    omeprazole (PRILOSEC) 40 MG capsule TAKE ONE CAPSULE BY MOUTH DAILY. NEED APPOINTMENT FOR FURTHER REFILLS Qty: 90 capsule, Refills: 1    potassium chloride SA (K-DUR,KLOR-CON) 20 MEQ tablet Take 40 mEq once by mouth. Refills: 1    rosuvastatin (CRESTOR) 20 MG tablet Take 1 tablet (20 mg total) by mouth daily. Qty: 30 tablet, Refills: 2    traMADol (ULTRAM) 50 MG tablet Take 1 tablet (50 mg total) by mouth every 6 (six) hours as needed. Qty: 30 tablet, Refills: 0   Associated Diagnoses: PVD (peripheral vascular disease) (HCC)    vitamin B-12 1000 MCG tablet Take 1 tablet (1,000 mcg total) by mouth  daily. Qty: 30 tablet, Refills: 0    gabapentin (NEURONTIN) 600 MG tablet TAKE 1 TABLET BY MOUTH THREE TIMES DAILY GENERIC EQUIVALENT FOR NEURONTIN Qty: 270 tablet, Refills: 1    oxyCODONE-acetaminophen (PERCOCET/ROXICET) 5-325 MG tablet Take 1-2 tablets by mouth every 4 (four) hours as needed for severe pain. Qty: 30 tablet, Refills: 0      STOP taking these medications     ondansetron (ZOFRAN) 4 MG tablet        Allergies  Allergen Reactions  . Amitiza [Lubiprostone] Diarrhea    Uncontrollable diarrhea  . Nortriptyline Other (See Comments)    Stomach distention  . Sulfa Antibiotics Nausea And Vomiting and Other (See Comments)    GI distress/pain  . Atorvastatin Nausea And Vomiting  . Claritin [Loratadine] Other (See Comments)    Hot flashes  . Dexamethasone Itching  . Prednisone Rash      The results of  significant diagnostics from this hospitalization (including imaging, microbiology, ancillary and laboratory) are listed below for reference.    Significant Diagnostic Studies: No results found.  Microbiology: Recent Results (from the past 240 hour(s))  Surgical PCR screen     Status: None   Collection Time: 10/12/17  9:52 PM  Result Value Ref Range Status   MRSA, PCR NEGATIVE NEGATIVE Final   Staphylococcus aureus NEGATIVE NEGATIVE Final    Comment: (NOTE) The Xpert SA Assay (FDA approved for NASAL specimens in patients 74 years of age and older), is one component of a comprehensive surveillance program. It is not intended to diagnose infection nor to guide or monitor treatment.      Labs: Basic Metabolic Panel: Recent Labs  Lab 10/10/17 0217  10/10/17 1507 10/11/17 0623 10/12/17 0235 10/13/17 0517 10/14/17 0333  NA 141   < > 140 138 136 140 136  K 2.0*   < > 2.8* 2.7* 3.7 4.8 4.8  CL 104   < > 107 103 105 109 108  CO2 28   < > 26 26 25  21* 21*  GLUCOSE 105*   < > 98 94 104* 88 93  BUN <5*   < > <5* <5* 5* 8 12  CREATININE 0.68   < > 0.63 0.63  0.71 0.72 0.81  CALCIUM 7.4*   < > 7.3* 7.8* 7.7* 8.4* 8.3*  MG 1.6*  --   --  1.6* 2.2 1.7 1.5*  PHOS  --   --   --  3.3  --   --   --    < > = values in this interval not displayed.   Liver Function Tests: No results for input(s): AST, ALT, ALKPHOS, BILITOT, PROT, ALBUMIN in the last 168 hours. No results for input(s): LIPASE, AMYLASE in the last 168 hours. No results for input(s): AMMONIA in the last 168 hours. CBC: Recent Labs  Lab 10/14/17 0333  WBC 7.7  HGB 9.3*  HCT 29.2*  MCV 93.6  PLT 297   Cardiac Enzymes: Recent Labs  Lab 10/12/17 0838 10/12/17 1415 10/12/17 2032  TROPONINI 0.07* 0.05* 0.03*   BNP: BNP (last 3 results) No results for input(s): BNP in the last 8760 hours.  ProBNP (last 3 results) No results for input(s): PROBNP in the last 8760 hours.  CBG: Recent Labs  Lab 10/14/17 1628 10/14/17 2146 10/15/17 1620 10/15/17 2103 10/16/17 0734  GLUCAP 103* 118* 110* 105* 91    Signed:  Velvet Bathe MD.  [facility] 10/16/2017, 3:09 PM

## 2017-10-16 NOTE — Progress Notes (Signed)
Nutrition Follow-up  DOCUMENTATION CODES:   Severe malnutrition in context of chronic illness  INTERVENTION:   -D/c Boost Breeze, due to poor acceptance -D/c Premier Protein, due to poor acceptance -30 ml Prostat BID, each supplement provides 100 kcals and 15 grams protein -MVI daily  NUTRITION DIAGNOSIS:   Severe Malnutrition related to chronic illness as evidenced by percent weight loss, severe fat depletion, severe muscle depletion.  Ongoing  GOAL:   Patient will meet greater than or equal to 90% of their needs  Progressing  MONITOR:   PO intake, I & O's, Labs, Weight trends, Supplement acceptance  REASON FOR ASSESSMENT:   Malnutrition Screening Tool    ASSESSMENT:   Erin [name] is a 61 y.o. female with medical history significant of recent fempop surgery 2 months ago, anal cancer in remission 4 years ago, electrolyte abnormalities, depression, chronic pain, poor appetitite comes in with 4 days of worsening muscle spasms especiaally in her wrists along with feeling very weak  11/23- s/p lt cardiac cath   Unable to speak with pt; pt in with multiple staff members at time of visit.   Meal completion has improved; noted 80-100% of meals. Pt with very poor acceptance of supplements.   Per MD notes, recommending gastric emptying study as an outpatient. MD suspects potential gastroparesis vs gastric outlet obstruction.   Noted pt with 4.4% wt loss over the past 8 days. Will trial Prostat supplement due to low volume.   Labs reviewed: CBGS: 91-105 (inpatient orders for glycemic control   Diet Order:  Diet heart healthy/carb modified Room service appropriate? Yes; Fluid consistency: Thin  EDUCATION NEEDS:   Education needs have been addressed  Skin:  Skin Assessment: Reviewed RN Assessment  Last BM:  10/15/17  Height:   Ht Readings from Last 1 Encounters:  10/14/17 5\' 3"  (1.6 m)    Weight:   Wt Readings from Last 1 Encounters:  10/16/17 86 lb 12.8  oz (39.4 kg)    Ideal Body Weight:  52.27 kg  BMI:  Body mass index is 15.38 kg/m.  Estimated Nutritional Needs:   Kcal:  1250-1450 calories  Protein:  61-70 grams (1.5-1.7g/kg)  Fluid:  >1.5L   Welles Walthall A. Jimmye Norman, RD, LDN, CDE Pager: [phone] After hours Pager: [phone]

## 2017-10-17 ENCOUNTER — Telehealth: Payer: Self-pay | Admitting: Physician Assistant

## 2017-10-17 LAB — GLUCOSE, CAPILLARY
GLUCOSE-CAPILLARY: 94 mg/dL (ref 65–99)
GLUCOSE-CAPILLARY: 106 mg/dL — AB (ref 65–99)
Glucose-Capillary: 115 mg/dL — ABNORMAL HIGH (ref 65–99)

## 2017-10-17 NOTE — Telephone Encounter (Signed)
Reviewed request to switch from Dr. Irish Lack to Dr. Saunders Revel with each respective doctor who have both granted permission for this switch. Erin Nevel PA-C

## 2017-10-18 ENCOUNTER — Telehealth (HOSPITAL_COMMUNITY): Payer: Self-pay

## 2017-10-18 NOTE — Telephone Encounter (Signed)
Patients insurance is active and benefits verified through [employer] - No co-pay, deductible amount of $400.00/$312.16 has been met, out of pocket amount of $800.00/$800.00 has been met, 30% co-insurance, and no pre-authorization is required. Passport/reference #[id number] ° °Patient will be contacted and scheduled. °

## 2017-10-23 ENCOUNTER — Other Ambulatory Visit: Payer: Self-pay

## 2017-10-23 ENCOUNTER — Ambulatory Visit (HOSPITAL_COMMUNITY): Payer: BLUE CROSS/BLUE SHIELD | Attending: Internal Medicine

## 2017-10-23 DIAGNOSIS — I2510 Atherosclerotic heart disease of native coronary artery without angina pectoris: Secondary | ICD-10-CM | POA: Diagnosis not present

## 2017-10-23 DIAGNOSIS — Z8249 Family history of ischemic heart disease and other diseases of the circulatory system: Secondary | ICD-10-CM | POA: Insufficient documentation

## 2017-10-23 DIAGNOSIS — R0600 Dyspnea, unspecified: Secondary | ICD-10-CM | POA: Insufficient documentation

## 2017-10-26 ENCOUNTER — Encounter: Payer: Self-pay | Admitting: Family Medicine

## 2017-10-26 ENCOUNTER — Ambulatory Visit: Payer: BLUE CROSS/BLUE SHIELD | Admitting: Family Medicine

## 2017-10-26 VITALS — BP 102/60 | HR 90 | Temp 97.9°F | Ht 63.0 in

## 2017-10-26 DIAGNOSIS — Z23 Encounter for immunization: Secondary | ICD-10-CM | POA: Diagnosis not present

## 2017-10-26 DIAGNOSIS — F339 Major depressive disorder, recurrent, unspecified: Secondary | ICD-10-CM | POA: Diagnosis not present

## 2017-10-26 DIAGNOSIS — E876 Hypokalemia: Secondary | ICD-10-CM | POA: Diagnosis not present

## 2017-10-26 LAB — BASIC METABOLIC PANEL
BUN: 21 mg/dL (ref 6–23)
CHLORIDE: 104 meq/L (ref 96–112)
CO2: 25 meq/L (ref 19–32)
CREATININE: 0.64 mg/dL (ref 0.40–1.20)
Calcium: 9.1 mg/dL (ref 8.4–10.5)
GFR: 100.21 mL/min (ref >60.00)
Glucose, Bld: 80 mg/dL (ref 70–99)
POTASSIUM: 3.8 meq/L (ref 3.5–5.1)
Sodium: 138 mEq/L (ref 135–145)

## 2017-10-26 MED ORDER — PAROXETINE HCL 20 MG PO TABS: 20.0000 mg | ORAL_TABLET | Freq: Every day | ORAL | 1 refills | Status: DC

## 2017-10-26 NOTE — Progress Notes (Signed)
HPI:  Erin [name] is a pleasant 61 y.o. with a PMH significant for anal ca, hypokalemia, diet controlled diabetes, HLD, Hypothyroidism, depression, anxiety, PAD, s/p aortobifem and bilat fem-pop bypasses in September here for an acute visit to "check potassium." She has been through a lot.  She had vascular surgery in September.  Then she went to the hospital recently for cramping and poor diet with weight loss and was found to have severe hypokalemia.  During that hospitalization he ended up having a cardiac stent coronary artery disease and also transient stress cardiomyopathy.  Since discharge she has felt better and her appetite has returned so she is eating quite well now.  She is taking potassium by mouth.  Had a little cramping in her arm yesterday and she wants to check her potassium. Depression and anxiety related to all this.  He was on Paxil in the past and did quite well on that.  She is willing to consider some therapy.  At one point after her vascular surgery felt like she would be better off dead.  She has a strong [place] faith, and this is carried her through.  Lateral ideation or thoughts of self-harm at this time.   ROS: See pertinent positives and negatives per HPI.  Past Medical History:  Diagnosis Date  . Allergy   . Alopecia   . 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 Anxiety   . B12 deficiency anemia 09/14/2015  . Blood transfusion without reported diagnosis   . CAD (coronary artery disease), native coronary artery 10/13/2017   DES to mid RCA  . Chronic back pain   . Chronic daily headache 03/29/2013   takes bc powder  . Closed right hip fracture ([place]) 09/10/2015  . Depression   . DM (diabetes mellitus) ([place])    diet controlled; does not check BG  . GERD (gastroesophageal reflux disease)   . History of hiatal hernia   . HLD (hyperlipidemia)   . Hot flashes   . Hyperlipemia 04/11/2014  .  Hypokalemia 05/2016  . Hypothyroidism 09/10/2015  . IBS (irritable bowel syndrome) 03/29/2013  . 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 QT prolongation   . Tubular adenoma of colon 09/08/2003  . Vertigo   . Wears glasses     Past Surgical History:  Procedure Laterality Date  . ABDOMINAL AORTOGRAM N/A 08/02/2017   Procedure: ABDOMINAL AORTOGRAM;  Surgeon: Wellington Hampshire, MD;  Location: [facility];  Service: Cardiovascular;  Laterality: N/A;  . AORTA - BILATERAL FEMORAL ARTERY BYPASS GRAFT N/A 08/14/2017   Procedure: AORTA BIFEMORAL BYPASS GRAFT;  Surgeon: Rosetta Posner, MD;  Location: [facility];  Service: Vascular;  Laterality: N/A;  . COLONOSCOPY    . CORONARY STENT INTERVENTION N/A 10/13/2017   Procedure: CORONARY STENT INTERVENTION;  Surgeon: Nelva Bush, MD;  Location: [facility];  Service: Cardiovascular;  Laterality: N/A;  . dental implant    . ECTOPIC PREGNANCY SURGERY    . EMBOLECTOMY N/A 08/14/2017   Procedure: EMBOLECTOMY FEMORAL;  Surgeon: Rosetta Posner, MD;  Location: [facility];  Service: Vascular;  Laterality: N/A;  . FEMORAL-POPLITEAL BYPASS GRAFT Right 08/14/2017   Procedure: Right Femoral to Above Knee Popliteal Bypass Graft using Non-Reversed Greater Saphenous Vein Graft from Right Leg;  Surgeon: Rosetta Posner, MD;  Location: [place];  Service: Vascular;  Laterality: Right;  . FLEXIBLE SIGMOIDOSCOPY N/A 08/14/2013   Procedure:  FLEXIBLE SIGMOIDOSCOPY;  Surgeon: Ladene Artist, MD;  Location: [facility];  Service: Endoscopy;  Laterality: N/A;  . LEFT HEART CATH AND CORONARY ANGIOGRAPHY N/A 10/13/2017   Procedure: LEFT HEART CATH AND CORONARY ANGIOGRAPHY;  Surgeon: Nelva Bush, MD;  Location: [facility];  Service: Cardiovascular;  Laterality: N/A;  . LOWER EXTREMITY ANGIOGRAPHY Bilateral 08/02/2017   Procedure: Lower Extremity Angiography;  Surgeon: Wellington Hampshire, MD;  Location: [facility];  Service: Cardiovascular;  Laterality:  Bilateral;  . MULTIPLE TOOTH EXTRACTIONS    . PILONIDAL CYST EXCISION    . POLYPECTOMY    . THROMBECTOMY FEMORAL ARTERY Right 08/14/2017   Procedure: THROMBECTOMY FEMORAL ARTERY;  Surgeon: Rosetta Posner, MD;  Location: [place];  Service: Vascular;  Laterality: Right;  . TONSILLECTOMY    . TOTAL HIP ARTHROPLASTY  09/11/2015   Procedure: TOTAL HIP ARTHROPLASTY;  Surgeon: Renette Butters, MD;  Location: [place];  Service: Orthopedics;;  . ULTRASOUND GUIDANCE FOR VASCULAR ACCESS  10/13/2017   Procedure: Ultrasound Guidance For Vascular Access;  Surgeon: Nelva Bush, MD;  Location: [facility];  Service: Cardiovascular;;    Family History  Problem Relation Age of Onset  . Arthritis Mother   . Hyperlipidemia Mother   . Heart disease Mother   . Hypertension Mother   . Stroke Mother 14  . Irritable bowel syndrome Mother   . Thyroid disease Mother   . Heart attack Mother   . Heart disease Father   . Hyperlipidemia Father   . Hypertension Father   . Stroke Father 55  . Thyroid disease Father   . Prostate cancer Father   . Heart attack Father   . Lung cancer Brother   . Heart attack Maternal Grandfather   . Heart attack Maternal Uncle   . Colon cancer Neg Hx   . Rectal cancer Neg Hx   . Stomach cancer Neg Hx     Social History   Socioeconomic History  . Marital status: Married    Spouse name: None  . Number of children: 0  . Years of education: None  . Highest education level: None  Social Needs  . Financial resource strain: None  . Food insecurity - worry: None  . Food insecurity - inability: None  . Transportation needs - medical: None  . Transportation needs - non-medical: None  Occupational History  . Occupation: caregiver  Tobacco Use  . Smoking status: Former Smoker    Packs/day: 0.50    Types: Cigarettes    Last attempt to quit: 10/07/2014    Years since quitting: 3.0  . Smokeless tobacco: Never Used  Substance and Sexual Activity  . Alcohol use: No     Alcohol/week: 0.0 oz  . Drug use: No  . Sexual activity: Yes    Partners: Male  Other Topics Concern  . None  Social History Narrative   Work or School: homemaker      Home Situation: lives with her husband,[name] takes care of her elderly parents       Spiritual Beliefs: Christian      Lifestyle: no regular exercise, poor diet              Current Outpatient Medications:  .  acetaminophen (TYLENOL) 500 MG tablet, Take 500 mg every 6 (six) hours as needed by mouth for headache (pain)., Disp: , Rfl:  .  acyclovir ointment (ZOVIRAX) 5 %, Apply 1 application topically every 3 (three) hours. (Patient taking differently:  Apply 1 application every 3 (three) hours as needed topically (fever blisters). ), Disp: 15 g, Rfl: 1 .  aspirin EC 81 MG tablet, Take 81 mg by mouth daily., Disp: , Rfl:  .  Biotin 10000 MCG TABS, Take 10,000 mcg by mouth daily., Disp: , Rfl:  .  dicyclomine (BENTYL) 10 MG capsule, TAKE ONE CAPSULE BY MOUTH THREE TIMES DAILY BEFORE MEALS. DUE FOR FOLLOW UP IN MAY, NEEDS TO SCHEDULE AN APPOINTMENT (Patient taking differently: TAKE ONE CAPSULE (10 MG) BY MOUTH THREE TIMES DAILY), Disp: 270 capsule, Rfl: 1 .  fluticasone (FLONASE) 50 MCG/ACT nasal spray, USE 1 SPRAY IN EACH NOSTRIL DAILY (Patient taking differently: USE 1 SPRAY IN EACH NOSTRIL DAILY AT BEDTMIE), Disp: 16 g, Rfl: 5 .  gabapentin (NEURONTIN) 600 MG tablet, TAKE 1 TABLET BY MOUTH THREE TIMES DAILY GENERIC EQUIVALENT FOR NEURONTIN, Disp: 270 tablet, Rfl: 1 .  levothyroxine (SYNTHROID, LEVOTHROID) 75 MCG tablet, TAKE 1 TABLET BY MOUTH DAILY. GENERIC EQUIVALENT FOR SYNTHROID (Patient taking differently: TAKE 1 TABLET (75 MCG) BY MOUTH DAILY. GENERIC EQUIVALENT FOR SYNTHROID), Disp: 90 tablet, Rfl: 1 .  LINZESS 145 MCG CAPS capsule, TAKE ONE CAPSULE BY MOUTH DAILY BEFORE BREAKFAST (Patient taking differently: Take 145 mcg daily by mouth. ), Disp: 90 capsule, Rfl: 3 .  meclizine (ANTIVERT) 12.5 MG tablet, TAKE 1  TABLET (12.5 MG TOTAL) BY MOUTH 3 (THREE) TIMES DAILY AS NEEDED FOR DIZZINESS. (Patient taking differently: Take 12.5 mg 3 (three) times daily as needed by mouth for dizziness (vertigo). ), Disp: 30 tablet, Rfl: 0 .  metoprolol succinate (TOPROL-XL) 25 MG 24 hr tablet, Take 1 tablet (25 mg total) by mouth 2 (two) times daily., Disp: 60 tablet, Rfl: 0 .  Multiple Vitamins-Minerals (PRESERVISION AREDS 2 PO), Take 1 capsule by mouth daily., Disp: , Rfl:  .  nystatin-triamcinolone (MYCOLOG II) cream, APPLY TO CORNERS OF THE MOUTH TWICE A DAY AS NEEDED FOR CRACKING/BURNING, Disp: , Rfl: 2 .  omeprazole (PRILOSEC) 40 MG capsule, TAKE ONE CAPSULE BY MOUTH DAILY. NEED APPOINTMENT FOR FURTHER REFILLS (Patient taking differently: TAKE ONE CAPSULE (40 MG) BY MOUTH DAILY. NEED APPOINTMENT FOR FURTHER REFILLS), Disp: 90 capsule, Rfl: 1 .  potassium chloride SA (K-DUR,KLOR-CON) 20 MEQ tablet, Take 40 mEq once by mouth., Disp: , Rfl: 1 .  ticagrelor (BRILINTA) 90 MG TABS tablet, Take 1 tablet (90 mg total) by mouth 2 (two) times daily., Disp: 60 tablet, Rfl: 0 .  vitamin B-12 1000 MCG tablet, Take 1 tablet (1,000 mcg total) by mouth daily., Disp: 30 tablet, Rfl: 0 .  PARoxetine (PAXIL) 20 MG tablet, Take 1 tablet (20 mg total) by mouth daily., Disp: 90 tablet, Rfl: 1 .  rosuvastatin (CRESTOR) 20 MG tablet, Take 1 tablet (20 mg total) by mouth daily., Disp: 30 tablet, Rfl: 2  EXAM:  Vitals:   10/26/17 1354  BP: 102/60  Pulse: 90  Temp: 97.9 F (36.6 C)  SpO2: 96%    Body mass index is 15.38 kg/m.  GENERAL: vitals reviewed and listed above, alert, oriented, thin  HEENT: atraumatic, conjunttiva clear, no obvious abnormalities on inspection of external nose and ears  NECK: no obvious masses on inspection  LUNGS: clear to auscultation bilaterally, no wheezes, rales or rhonchi, good air movement  CV: HRRR, no peripheral edema  MS: moves all extremities without noticeable abnormality, holds on to  husband that she walks  PSYCH: pleasant and cooperative, no obvious depression or anxiety  ASSESSMENT AND PLAN:  Discussed the following  assessment and plan:  Depression, recurrent ([place]) - Plan: PARoxetine (PAXIL) 20 MG tablet  Hypokalemia - Plan: Basic metabolic panel  Need for immunization against influenza - Plan: Flu Vaccine QUAD 6+ mos PF IM (Fluarix Quad PF)  -BMP to check her potassium -Counseled and supported and offered nutrition referral, physical therapy, she declined -Paxil and to may be seeing a therapist -Rx sent in brochure for CBT provided -Patient advised to return or notify a doctor immediately if symptoms worsen or persist or new concerns arise.  Patient Instructions  BEFORE YOU LEAVE: -3 months - cancel other follow up -labs -does she want flu shot?  Restart the paxil and take once daily.  Call the number provided on the brochure to set up counseling.  Hang in there.   We have ordered labs or studies at this visit. It can take up to 1-2 weeks for results and processing. IF results require follow up or explanation, we will call you with instructions. Clinically stable results will be released to your [facility]. If you have not heard from [place] or cannot find your results in [facility] in 2 weeks please contact our office at [phone].  If you are not yet signed up for [facility], please consider signing up.          Colin Benton R., DO

## 2017-10-26 NOTE — Patient Instructions (Addendum)
BEFORE YOU LEAVE: -3 months - cancel other follow up -labs -does she want flu shot?  Restart the paxil and take once daily.  Call the number provided on the brochure to set up counseling.  Hang in there.   We have ordered labs or studies at this visit. It can take up to 1-2 weeks for results and processing. IF results require follow up or explanation, we will call you with instructions. Clinically stable results will be released to your [facility]. If you have not heard from [place] or cannot find your results in [facility] in 2 weeks please contact our office at [phone].  If you are not yet signed up for [facility], please consider signing up.

## 2017-10-31 ENCOUNTER — Inpatient Hospital Stay: Payer: BLUE CROSS/BLUE SHIELD | Admitting: Family Medicine

## 2017-11-02 ENCOUNTER — Encounter: Payer: Self-pay | Admitting: Internal Medicine

## 2017-11-02 ENCOUNTER — Ambulatory Visit: Payer: BLUE CROSS/BLUE SHIELD | Admitting: Internal Medicine

## 2017-11-02 VITALS — BP 118/70 | HR 72 | Ht 63.0 in | Wt 96.0 lb

## 2017-11-02 DIAGNOSIS — E785 Hyperlipidemia, unspecified: Secondary | ICD-10-CM

## 2017-11-02 DIAGNOSIS — I2510 Atherosclerotic heart disease of native coronary artery without angina pectoris: Secondary | ICD-10-CM

## 2017-11-02 DIAGNOSIS — I429 Cardiomyopathy, unspecified: Secondary | ICD-10-CM

## 2017-11-02 DIAGNOSIS — I739 Peripheral vascular disease, unspecified: Secondary | ICD-10-CM

## 2017-11-02 DIAGNOSIS — R9431 Abnormal electrocardiogram [ECG] [EKG]: Secondary | ICD-10-CM | POA: Diagnosis not present

## 2017-11-02 MED ORDER — NITROGLYCERIN 0.4 MG SL SUBL: 0.4000 mg | SUBLINGUAL_TABLET | SUBLINGUAL | 1 refills | Status: DC | PRN

## 2017-11-02 MED ORDER — LISINOPRIL 2.5 MG PO TABS: 2.5000 mg | ORAL_TABLET | Freq: Every day | ORAL | 1 refills | Status: DC

## 2017-11-02 NOTE — Progress Notes (Signed)
Follow-up Outpatient Visit Date: 11/02/2017  Primary Care Provider: Lucretia Kern, DO [address] [place] [address]  Chief Complaint: Shortness of breath  HPI:  Erin [name] is a 61 y.o. 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  The patient was admitted [facility] on 10/08/17 with generalized fatigue and muscle cramps.  She was found to be severely hypokalemic with a potassium less than 2.  Minimal troponin elevation was noted, peaking at 0.07.  Because of intermittent chest tightness and shortness of breath, the patient was  Referred for left heart catheterization.  This revealed 99% stenosis in the mid RCA and otherwise nonobstructive CAD.  LVEF was moderately reduced with focal mid anterior hypokinesis suggestive of possible concurrent stress-induced cardiomyopathy.  The patient underwent successful PCI to the mid RCA.  Her hospitalization was prolonged due to low blood pressure and significant shortness of breath.  Echocardiogram during the hospitalization echocardiogram following hospital discharge showed persistent moderately reduced LVEF on 10/23/17.  Today Erin [name] reports that she is gradually feeling better.  She has not had any further chest pain.  She still has exertional dyspnea with minimal activity such as walking across a room.  However, this is slowly improving.  She denies orthopnea, PND, edema, palpitations, and lightheadedness.  She is tolerating her medications well, including dual antiplatelet therapy with aspirin and clopidogrel.   Following her catheterization, which required left brachial artery access due to poor radial and ulnar access in both arms, Erin [name] had bruising of the right forearm and mild discomfort involving her middle and ring fingers.  These findings have since resolved.  Erin [name] continues to have intermittent nausea and inquires about anti-emetics that are safe to use given her prolonged QTc during recent hospitalization.  --------------------------------------------------------------------------------------------------  Past Medical History:  Diagnosis Date  . Allergy   . Alopecia   . Anal cancer ([place]) 08/14/13   invasive squamous cell ca, s/p radiation 10/20-11/26/14 60.4Gy/71fx and chemo  . Anxiety   . B12 deficiency anemia 09/14/2015  . Blood transfusion without reported diagnosis   . CAD (coronary artery disease), native coronary artery 10/13/2017   DES to mid RCA  . Chronic back pain   . Chronic daily headache 03/29/2013   takes bc powder  . Closed right hip fracture ([place]) 09/10/2015  . Depression   . DM (diabetes mellitus) ([place])    diet controlled; does not check BG  . GERD (gastroesophageal reflux disease)   . History of hiatal hernia   . HLD (hyperlipidemia)   . Hot flashes   . Hyperlipemia 04/11/2014  . Hypokalemia 05/2016  . Hypothyroidism 09/10/2015  . IBS (irritable bowel syndrome) 03/29/2013  . 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 QT prolongation   . Tubular adenoma of colon 09/08/2003  . Vertigo   . Wears glasses    Past Surgical History:  Procedure Laterality Date  . ABDOMINAL AORTOGRAM N/A 08/02/2017   Procedure: ABDOMINAL AORTOGRAM;  Surgeon: Wellington Hampshire, MD;  Location: [facility];  Service: Cardiovascular;  Laterality: N/A;  . AORTA - BILATERAL FEMORAL ARTERY BYPASS GRAFT N/A 08/14/2017   Procedure: AORTA BIFEMORAL BYPASS GRAFT;  Surgeon: Rosetta Posner, MD;  Location: [facility];  Service: Vascular;  Laterality: N/A;  . COLONOSCOPY    . CORONARY  STENT INTERVENTION N/A 10/13/2017   Procedure: CORONARY STENT INTERVENTION;  Surgeon: Nelva Bush, MD;  Location: [facility];  Service: Cardiovascular;  Laterality: N/A;  . dental implant    . ECTOPIC PREGNANCY SURGERY    . EMBOLECTOMY N/A 08/14/2017   Procedure: EMBOLECTOMY FEMORAL;  Surgeon: Rosetta Posner, MD;  Location: [facility];  Service: Vascular;  Laterality: N/A;  . FEMORAL-POPLITEAL BYPASS GRAFT Right 08/14/2017   Procedure: Right Femoral to Above Knee Popliteal Bypass Graft using Non-Reversed Greater Saphenous Vein Graft from Right Leg;  Surgeon: Rosetta Posner, MD;  Location: [facility];  Service: Vascular;  Laterality: Right;  . FLEXIBLE SIGMOIDOSCOPY N/A 08/14/2013   Procedure: FLEXIBLE SIGMOIDOSCOPY;  Surgeon: Ladene Artist, MD;  Location: [facility];  Service: Endoscopy;  Laterality: N/A;  . LEFT HEART CATH AND CORONARY ANGIOGRAPHY N/A 10/13/2017   Procedure: LEFT HEART CATH AND CORONARY ANGIOGRAPHY;  Surgeon: Nelva Bush, MD;  Location: [facility];  Service: Cardiovascular;  Laterality: N/A;  . LOWER EXTREMITY ANGIOGRAPHY Bilateral 08/02/2017   Procedure: Lower Extremity Angiography;  Surgeon: Wellington Hampshire, MD;  Location: [facility];  Service: Cardiovascular;  Laterality: Bilateral;  . MULTIPLE TOOTH EXTRACTIONS    . PILONIDAL CYST EXCISION    . POLYPECTOMY    . THROMBECTOMY FEMORAL ARTERY Right 08/14/2017   Procedure: THROMBECTOMY FEMORAL ARTERY;  Surgeon: Rosetta Posner, MD;  Location: [facility];  Service: Vascular;  Laterality: Right;  . TONSILLECTOMY    . TOTAL HIP ARTHROPLASTY  09/11/2015   Procedure: TOTAL HIP ARTHROPLASTY;  Surgeon: Renette Butters, MD;  Location: [facility];  Service: Orthopedics;;  . ULTRASOUND GUIDANCE FOR VASCULAR ACCESS  10/13/2017   Procedure: Ultrasound Guidance For Vascular Access;  Surgeon: Nelva Bush, MD;  Location: [facility];  Service: Cardiovascular;;    Current Meds  Medication Sig  . acetaminophen  (TYLENOL) 500 MG tablet Take 500 mg every 6 (six) hours as needed by mouth for headache (pain).  Marland Kitchen acyclovir ointment (ZOVIRAX) 5 % Apply 1 application topically every 3 (three) hours. (Patient taking differently: Apply 1 application every 3 (three) hours as needed topically (fever blisters). )  . aspirin EC 81 MG tablet Take 81 mg by mouth daily.  . Biotin 10000 MCG TABS Take 10,000 mcg by mouth daily.  Marland Kitchen dicyclomine (BENTYL) 10 MG capsule TAKE ONE CAPSULE BY MOUTH THREE TIMES DAILY BEFORE MEALS. DUE FOR FOLLOW UP IN MAY, NEEDS TO SCHEDULE AN APPOINTMENT (Patient taking differently: TAKE ONE CAPSULE (10 MG) BY MOUTH THREE TIMES DAILY)  . fluticasone (FLONASE) 50 MCG/ACT nasal spray USE 1 SPRAY IN EACH NOSTRIL DAILY (Patient taking differently: USE 1 SPRAY IN EACH NOSTRIL DAILY AT BEDTMIE)  . gabapentin (NEURONTIN) 600 MG tablet TAKE 1 TABLET BY MOUTH THREE TIMES DAILY GENERIC EQUIVALENT FOR NEURONTIN  . levothyroxine (SYNTHROID, LEVOTHROID) 75 MCG tablet TAKE 1 TABLET BY MOUTH DAILY. GENERIC EQUIVALENT FOR SYNTHROID (Patient taking differently: TAKE 1 TABLET (75 MCG) BY MOUTH DAILY. GENERIC EQUIVALENT FOR SYNTHROID)  . LINZESS 145 MCG CAPS capsule TAKE ONE CAPSULE BY MOUTH DAILY BEFORE BREAKFAST (Patient taking differently: Take 145 mcg daily by mouth. )  . meclizine (ANTIVERT) 12.5 MG tablet TAKE 1 TABLET (12.5 MG TOTAL) BY MOUTH 3 (THREE) TIMES DAILY AS NEEDED FOR DIZZINESS. (Patient taking differently: Take 12.5 mg 3 (three) times  daily as needed by mouth for dizziness (vertigo). )  . metoprolol succinate (TOPROL-XL) 25 MG 24 hr tablet Take 1 tablet (25 mg total) by mouth 2 (two) times daily.  . Multiple Vitamins-Minerals (PRESERVISION AREDS 2 PO) Take 1 capsule by mouth daily.  Marland Kitchen nystatin-triamcinolone (MYCOLOG II) cream APPLY TO CORNERS OF THE MOUTH TWICE A DAY AS NEEDED FOR CRACKING/BURNING  . omeprazole (PRILOSEC) 40 MG capsule TAKE ONE CAPSULE BY MOUTH DAILY. NEED APPOINTMENT FOR FURTHER  REFILLS (Patient taking differently: TAKE ONE CAPSULE (40 MG) BY MOUTH DAILY. NEED APPOINTMENT FOR FURTHER REFILLS)  . PARoxetine (PAXIL) 20 MG tablet Take 1 tablet (20 mg total) by mouth daily.  . potassium chloride SA (K-DUR,KLOR-CON) 20 MEQ tablet Take 40 mEq once by mouth.  . ticagrelor (BRILINTA) 90 MG TABS tablet Take 1 tablet (90 mg total) by mouth 2 (two) times daily.  . vitamin B-12 1000 MCG tablet Take 1 tablet (1,000 mcg total) by mouth daily.    Allergies: Amitiza [lubiprostone]; Nortriptyline; Sulfa antibiotics; Atorvastatin; Claritin [loratadine]; Dexamethasone; and Prednisone  Social History   Socioeconomic History  . Marital status: Married    Spouse name: Not on file  . Number of children: 0  . Years of education: Not on file  . Highest education level: Not on file  Social Needs  . Financial resource strain: Not on file  . Food insecurity - worry: Not on file  . Food insecurity - inability: Not on file  . Transportation needs - medical: Not on file  . Transportation needs - non-medical: Not on file  Occupational History  . Occupation: caregiver  Tobacco Use  . Smoking status: Former Smoker    Packs/day: 0.50    Types: Cigarettes    Last attempt to quit: 10/07/2014    Years since quitting: 3.0  . Smokeless tobacco: Never Used  Substance and Sexual Activity  . Alcohol use: No    Alcohol/week: 0.0 oz  . Drug use: No  . Sexual activity: Yes    Partners: Male  Other Topics Concern  . Not on file  Social History Narrative   Work or School: homemaker      Home Situation: lives with her husband,[name] takes care of her elderly parents       Spiritual Beliefs: Christian      Lifestyle: no regular exercise, poor diet             Family History  Problem Relation Age of Onset  . Arthritis Mother   . Hyperlipidemia Mother   . Heart disease Mother   . Hypertension Mother   . Stroke Mother 28  . Irritable bowel syndrome Mother   . Thyroid disease Mother    . Heart attack Mother   . Heart disease Father   . Hyperlipidemia Father   . Hypertension Father   . Stroke Father 23  . Thyroid disease Father   . Prostate cancer Father   . Heart attack Father   . Lung cancer Brother   . Heart attack Maternal Grandfather   . Heart attack Maternal Uncle   . Colon cancer Neg Hx   . Rectal cancer Neg Hx   . Stomach cancer Neg Hx     Review of Systems: A 12-system review of systems was performed and was negative except as noted in the HPI.  --------------------------------------------------------------------------------------------------  Physical Exam: BP 118/70 (BP Location: Left Arm, Patient Position: Sitting)   Pulse 72   Ht 5\' 3"  (1.6 m)   Wt  96 lb (43.5 kg)   SpO2 98%   BMI 17.01 kg/m    General: Frail, cachectic woman, seated comfortably in the exam room.  She is accompanied by her husband. HEENT: No conjunctival pallor or scleral icterus. Moist mucous membranes.  OP clear. Neck: Supple without lymphadenopathy, thyromegaly, JVD, or HJR.  Lungs: Normal work of breathing. Clear to auscultation bilaterally without wheezes or crackles. Heart: Regular rate and rhythm without murmurs, rubs, or gallops. Non-displaced PMI. Abd: Bowel sounds present.  Scaphoid abdomen with mild lower abdominal tenderness.  No rebound or guarding.  No hepatosplenomegaly. Ext: No lower extremity edema.  Radial pulses are trace bilaterally.  Left brachial pulses 2+. Skin: Warm and dry without rash.  Mild bruising noted along the right forearm.  No hematoma noted.  EKG:  NSR without significant abnormalities. QTc 445 ms.  Lab Results  Component Value Date   WBC 7.7 10/14/2017   HGB 9.3 (L) 10/14/2017   HCT 29.2 (L) 10/14/2017   MCV 93.6 10/14/2017   PLT 297 10/14/2017    Lab Results  Component Value Date   NA 138 10/26/2017   K 3.8 10/26/2017   CL 104 10/26/2017   CO2 25 10/26/2017   BUN 21 10/26/2017   CREATININE 0.64 10/26/2017   GLUCOSE 80  10/26/2017   ALT 15 08/18/2017    Lab Results  Component Value Date   CHOL 307 (H) 05/11/2017   HDL 44.70 05/11/2017   LDLCALC 217 (H) 03/09/2015   LDLDIRECT 214.0 09/26/2016   TRIG 366.0 (H) 09/26/2016   CHOLHDL 9 09/26/2016    --------------------------------------------------------------------------------------------------  ASSESSMENT AND PLAN: Coronary artery disease without angina Erin [name] has recovered relatively well from her PCI to the mid RCA last month.  She has not had any further episodes of chest pain, though she still remains quite short of breath with limited activity.  I suspect her dyspnea is multifactorial, including systolic heart failure and deconditioning.  If symptoms persist despite improvement in LV function, we would need to consider switching ticagrelor to clopidogrel to complete 12 months of dual antiplatelet therapy.  I have provided her with a prescription for sublingual nitroglycerin to be used as needed should chest pain recur.  We will refer Erin [name] for cardiac rehab.  Mixed ischemic and nonischemic cardiomyopathy Reduced LVEF (35-40% by most recent echo) is likely combination of coronary artery disease involving the RCA as well as possible stress-induced cardiomyopathy based on the results of the left ventriculogram at the time of catheterization last month.  Erin [name] appears euvolemic and has noted gradual improvement in her exertional dyspnea.  She still has NYHA class III heart failure.  Given that her blood pressure is normal today, we have agreed to add lisinopril 2.5 mg daily.  We will continue her current dose of metoprolol.  I will check a basic metabolic panel today to ensure stable renal function and potassium, as well as in about 2 weeks.  I will plan to recheck an echocardiogram with [facility] in 1-2 months, ideally once her evidence-based heart failure therapy has been optimized.  Hyperlipidemia Most recent LDL in April was significantly  elevated at 218.  Erin [name] is currently on rosuvastatin 20 mg daily.  We will plan to repeat a lipid panel when she returns for her BNP in about 2 weeks.  Peripheral vascular disease Continue medical therapy and secondary prevention, as well as follow-up with Dr. Donnetta Hutching.  QT prolongation Patient had significant QT prolongation during recent hospitalization in the  setting of marked hypokalemia.  QT was still slightly prolonged on most recent EKG.  Today, it appears normal.  I will recheck her potassium.  I would still advocate for avoiding QT prolonging medications to minimize the risk for recurrent QT prolongation and torsades.  Follow-up: Return to clinic in 1 month.  Nelva Bush, MD 11/03/2017 10:53 PM

## 2017-11-02 NOTE — Patient Instructions (Addendum)
Medication Instructions:  Start lisinopril 2.5 mg dail  Use Nitroglycerin as needed for chest pain.   Nitroglycerin sublingual tablets What is this medicine? NITROGLYCERIN (nye troe GLI ser in) is a type of vasodilator. It relaxes blood vessels, increasing the blood and oxygen supply to your heart. This medicine is used to relieve chest pain caused by angina. It is also used to prevent chest pain before activities like climbing stairs, going outdoors in cold weather, or sexual activity. This medicine may be used for other purposes; ask your health care provider or pharmacist if you have questions. COMMON BRAND NAME(S): Nitroquick, Nitrostat, Nitrotab What should I tell my health care provider before I take this medicine? They need to know if you have any of these conditions: -anemia -head injury, recent stroke, or bleeding in the brain -liver disease -previous heart attack -an unusual or allergic reaction to nitroglycerin, other medicines, foods, dyes, or preservatives -pregnant or trying to get pregnant -breast-feeding How should I use this medicine? Take this medicine by mouth as needed. At the first sign of an angina attack (chest pain or tightness) place one tablet under your tongue. You can also take this medicine 5 to 10 minutes before an event likely to produce chest pain. Follow the directions on the prescription label. Let the tablet dissolve under the tongue. Do not swallow whole. Replace the dose if you accidentally swallow it. It will help if your mouth is not dry. Saliva around the tablet will help it to dissolve more quickly. Do not eat or drink, smoke or chew tobacco while a tablet is dissolving. If you are not better within 5 minutes after taking ONE dose of nitroglycerin, call 9-1-1 immediately to seek emergency medical care. Do not take more than 3 nitroglycerin tablets over 15 minutes. If you take this medicine often to relieve symptoms of angina, your doctor or health care  professional may provide you with different instructions to manage your symptoms. If symptoms do not go away after following these instructions, it is important to call 9-1-1 immediately. Do not take more than 3 nitroglycerin tablets over 15 minutes. Talk to your pediatrician regarding the use of this medicine in children. Special care may be needed. Overdosage: If you think you have taken too much of this medicine contact a poison control center or emergency room at once. NOTE: This medicine is only for you. Do not share this medicine with others. What if I miss a dose? This does not apply. This medicine is only used as needed. What may interact with this medicine? Do not take this medicine with any of the following medications: -certain migraine medicines like ergotamine and dihydroergotamine (DHE) -medicines used to treat erectile dysfunction like sildenafil, tadalafil, and vardenafil -riociguat This medicine may also interact with the following medications: -alteplase -aspirin -heparin -medicines for high blood pressure -medicines for mental depression -other medicines used to treat angina -phenothiazines like chlorpromazine, mesoridazine, prochlorperazine, thioridazine This list may not describe all possible interactions. Give your health care provider a list of all the medicines, herbs, non-prescription drugs, or dietary supplements you use. Also tell them if you smoke, drink alcohol, or use illegal drugs. Some items may interact with your medicine. What should I watch for while using this medicine? Tell your doctor or health care professional if you feel your medicine is no longer working. Keep this medicine with you at all times. Sit or lie down when you take your medicine to prevent falling if you feel dizzy or faint  after using it. Try to remain calm. This will help you to feel better faster. If you feel dizzy, take several deep breaths and lie down with your feet propped up, or bend  forward with your head resting between your knees. You may get drowsy or dizzy. Do not drive, use machinery, or do anything that needs mental alertness until you know how this drug affects you. Do not stand or sit up quickly, especially if you are an older patient. This reduces the risk of dizzy or fainting spells. Alcohol can make you more drowsy and dizzy. Avoid alcoholic drinks. Do not treat yourself for coughs, colds, or pain while you are taking this medicine without asking your doctor or health care professional for advice. Some ingredients may increase your blood pressure. What side effects may I notice from receiving this medicine? Side effects that you should report to your doctor or health care professional as soon as possible: -blurred vision -dry mouth -skin rash -sweating -the feeling of extreme pressure in the head -unusually weak or tired Side effects that usually do not require medical attention (report to your doctor or health care professional if they continue or are bothersome): -flushing of the face or neck -headache -irregular heartbeat, palpitations -nausea, vomiting This list may not describe all possible side effects. Call your doctor for medical advice about side effects. You may report side effects to FDA at 1-800-FDA-1088. Where should I keep my medicine? Keep out of the reach of children. Store at room temperature between 20 and 25 degrees C (68 and 77 degrees F). Store in [employer]. Protect from light and moisture. Keep tightly closed. Throw away any unused medicine after the expiration date. NOTE: This sheet is a summary. It may not cover all possible information. If you have questions about this medicine, talk to your doctor, pharmacist, or health care provider.  2018 Elsevier/Gold Standard (2013-09-05 17:57:36)   Labwork: Your physician recommends that you return for a FASTING lipid profile /BMET in about 2 weeks.    Testing/Procedures: None    Follow-Up: Your physician recommends that you schedule a follow-up appointment in: about 1 month with Dr End.  This is scheduled for Thursday  November 30, 2017 at 9:40 AM  Any Other Special Instructions Will Be Listed Below (If Applicable). You have been referred to Cardiac Rehab at [facility].     If you need a refill on your cardiac medications before your next appointment, please call your pharmacy.

## 2017-11-03 ENCOUNTER — Encounter: Payer: Self-pay | Admitting: Internal Medicine

## 2017-11-03 DIAGNOSIS — I739 Peripheral vascular disease, unspecified: Secondary | ICD-10-CM | POA: Insufficient documentation

## 2017-11-03 DIAGNOSIS — I429 Cardiomyopathy, unspecified: Secondary | ICD-10-CM

## 2017-11-03 HISTORY — DX: Cardiomyopathy, unspecified: I42.9

## 2017-11-03 HISTORY — DX: Peripheral vascular disease, unspecified: I73.9

## 2017-11-06 ENCOUNTER — Telehealth (HOSPITAL_COMMUNITY): Payer: Self-pay

## 2017-11-06 ENCOUNTER — Other Ambulatory Visit: Payer: Self-pay | Admitting: Family Medicine

## 2017-11-06 NOTE — Telephone Encounter (Signed)
Attempted to call patient in regards to Cardiac Rehab - Lm on Vm °

## 2017-11-07 ENCOUNTER — Encounter: Payer: Self-pay | Admitting: Vascular Surgery

## 2017-11-07 ENCOUNTER — Ambulatory Visit: Payer: BLUE CROSS/BLUE SHIELD | Admitting: Vascular Surgery

## 2017-11-07 ENCOUNTER — Ambulatory Visit (HOSPITAL_COMMUNITY)
Admission: RE | Admit: 2017-11-07 | Discharge: 2017-11-07 | Disposition: A | Discharge status: Home / Self Care | Payer: BLUE CROSS/BLUE SHIELD | Source: Ambulatory Visit | Attending: Vascular Surgery | Admitting: Vascular Surgery

## 2017-11-07 VITALS — BP 116/70 | HR 57 | Temp 97.4°F | Resp 18 | Ht 63.0 in | Wt 93.4 lb

## 2017-11-07 DIAGNOSIS — I739 Peripheral vascular disease, unspecified: Secondary | ICD-10-CM

## 2017-11-07 NOTE — Progress Notes (Signed)
Patient name: Erin [name] MRN: [id number] DOB: 12-Nov-1956 Sex: female  REASON FOR VISIT: Follow-up extensive surgery September of this year.  HPI: Erin [name] is a 61 y.o. female who presented with critical limb ischemia bilaterally.  She underwent aortobifemoral bypass and had persistent ischemia and underwent vein femoral to above-knee popliteal bypasses in her right leg left leg immediately following surgery.  On my last visit in October she a great deal of difficulty with stamina and some issues with hypokalemia.  Follow-up with me today.  Since her last visit she had presented with progressive difficulty with hypokalemia and had further evaluation which resulted in discovery of critical stenosis of her right coronary artery and underwent angioplasty of this.  Interestingly she had had a normal stress test prior to her aortic surgery.  She looks quite good today.  She is regaining her appetite and weight loss following surgery.  She is walking without claudication symptoms and has no lower extremity arterial tissue loss  Current Outpatient Medications  Medication Sig Dispense Refill  . acetaminophen (TYLENOL) 500 MG tablet Take 500 mg every 6 (six) hours as needed by mouth for headache (pain).    Marland Kitchen acyclovir ointment (ZOVIRAX) 5 % Apply 1 application topically every 3 (three) hours. (Patient taking differently: Apply 1 application every 3 (three) hours as needed topically (fever blisters). ) 15 g 1  . aspirin EC 81 MG tablet Take 81 mg by mouth daily.    . Biotin 10000 MCG TABS Take 10,000 mcg by mouth daily.    Marland Kitchen dicyclomine (BENTYL) 10 MG capsule TAKE ONE CAPSULE BY MOUTH THREE TIMES DAILY BEFORE MEALS. DUE FOR FOLLOW UP IN MAY, NEEDS TO SCHEDULE AN APPOINTMENT (Patient taking differently: TAKE ONE CAPSULE (10 MG) BY MOUTH THREE TIMES DAILY) 270 capsule 1  . fluticasone (FLONASE) 50 MCG/ACT nasal spray USE 1 SPRAY IN EACH NOSTRIL DAILY (Patient  taking differently: USE 1 SPRAY IN EACH NOSTRIL DAILY AT BEDTMIE) 16 g 5  . gabapentin (NEURONTIN) 600 MG tablet TAKE 1 TABLET BY MOUTH THREE TIMES DAILY GENERIC EQUIVALENT FOR NEURONTIN 270 tablet 1  . levothyroxine (SYNTHROID, LEVOTHROID) 75 MCG tablet TAKE 1 TABLET BY MOUTH DAILY. GENERIC EQUIVALENT FOR SYNTHROID (Patient taking differently: TAKE 1 TABLET (75 MCG) BY MOUTH DAILY. GENERIC EQUIVALENT FOR SYNTHROID) 90 tablet 1  . LINZESS 145 MCG CAPS capsule TAKE ONE CAPSULE BY MOUTH DAILY BEFORE BREAKFAST (Patient taking differently: Take 145 mcg daily by mouth. ) 90 capsule 3  . lisinopril (PRINIVIL,ZESTRIL) 2.5 MG tablet Take 1 tablet (2.5 mg total) by mouth daily. 90 tablet 1  . meclizine (ANTIVERT) 12.5 MG tablet TAKE 1 TABLET (12.5 MG TOTAL) BY MOUTH 3 (THREE) TIMES DAILY AS NEEDED FOR DIZZINESS. 30 tablet 0  . metoprolol succinate (TOPROL-XL) 25 MG 24 hr tablet Take 1 tablet (25 mg total) by mouth 2 (two) times daily. 60 tablet 0  . Multiple Vitamins-Minerals (PRESERVISION AREDS 2 PO) Take 1 capsule by mouth daily.    . nitroGLYCERIN (NITROSTAT) 0.4 MG SL tablet Place 1 tablet (0.4 mg total) under the tongue every 5 (five) minutes as needed for chest pain. 100 tablet 1  . nystatin-triamcinolone (MYCOLOG II) cream APPLY TO CORNERS OF THE MOUTH TWICE A DAY AS NEEDED FOR CRACKING/BURNING  2  . omeprazole (PRILOSEC) 40 MG capsule TAKE ONE CAPSULE BY MOUTH DAILY. NEED APPOINTMENT FOR FURTHER REFILLS (Patient taking differently: TAKE ONE CAPSULE (40 MG) BY MOUTH DAILY. NEED APPOINTMENT FOR FURTHER REFILLS) 90 capsule 1  .  PARoxetine (PAXIL) 20 MG tablet Take 1 tablet (20 mg total) by mouth daily. 90 tablet 1  . potassium chloride SA (K-DUR,KLOR-CON) 20 MEQ tablet Take 40 mEq once by mouth.  1  . ticagrelor (BRILINTA) 90 MG TABS tablet Take 1 tablet (90 mg total) by mouth 2 (two) times daily. 60 tablet 0  . vitamin B-12 1000 MCG tablet Take 1 tablet (1,000 mcg total) by mouth daily. 30 tablet 0  .  rosuvastatin (CRESTOR) 20 MG tablet Take 1 tablet (20 mg total) by mouth daily. 30 tablet 2   No current facility-administered medications for this visit.      PHYSICAL EXAM: Vitals:   11/07/17 1458  BP: 116/70  Pulse: (!) 57  Resp: 18  Temp: (!) 97.4 F (36.3 C)  TempSrc: Oral  SpO2: 100%  Weight: 93 lb 6.4 oz (42.4 kg)  Height: 5\' 3"  (1.6 m)    GENERAL: The patient is a well-nourished female, in no acute distress. The vital signs are documented above. Abdominal and groin incisions are well-healed.  Palpable femoral popliteal and pedal pulses  MEDICAL ISSUES: Stable overall.  Ankle arm index normal and triphasic bilaterally.  She and her husband are pleased with the outcome.  We will see her again in 6 months with repeat noninvasive studies   Rosetta Posner, MD [facility] Tel [phone] Pager [phone]

## 2017-11-15 ENCOUNTER — Telehealth: Payer: Self-pay | Admitting: Internal Medicine

## 2017-11-15 NOTE — Telephone Encounter (Signed)
I agree with holding lisinopril due to symptomatic hypotension (even with minimal dose). She should continue her current dose of beta-blocker. We will f/u in the office as previously arranged.  Nelva Bush, MD [facility] Pager: [phone]

## 2017-11-15 NOTE — Telephone Encounter (Signed)
New message    Stopped taking this  Pt c/o medication issue:  1. Name of Medication:   lisinopril (PRINIVIL,ZESTRIL) 2.5 MG tablet Take 1 tablet (2.5 mg total) by mouth daily.     2. How are you currently taking this medication (dosage and times per day)? 1 tab daily  3. Are you having a reaction (difficulty breathing--STAT)? yes  4. What is your medication issue? It made her very weak and nauseated, her bp dropped to 70/50 , so she stopped taking it

## 2017-11-15 NOTE — Telephone Encounter (Signed)
Returned call to patient who states that she tried taking the lisinopril 2.5 mg QD that was prescribed on 12/13. Patient states that she took it 12/13-12/17. She states that her BP dropped and remained 70s/50s. She states that she was extremely weak, dizzy, and nauseous. She states that she stopped taking the lisinopril and she feels much better and her BP has been running around 102/61. Made patient aware that the information would be forwarded to Dr. Saunders Revel to make him aware.

## 2017-11-16 ENCOUNTER — Other Ambulatory Visit: Payer: Self-pay

## 2017-11-16 ENCOUNTER — Other Ambulatory Visit: Payer: Self-pay | Admitting: Internal Medicine

## 2017-11-16 ENCOUNTER — Telehealth: Payer: Self-pay | Admitting: *Deleted

## 2017-11-16 ENCOUNTER — Telehealth (HOSPITAL_COMMUNITY): Payer: Self-pay

## 2017-11-16 MED ORDER — TICAGRELOR 90 MG PO TABS: 90.0000 mg | ORAL_TABLET | Freq: Two times a day (BID) | ORAL | 3 refills | Status: DC

## 2017-11-16 MED ORDER — METOPROLOL SUCCINATE ER 25 MG PO TB24: 25.0000 mg | ORAL_TABLET | Freq: Two times a day (BID) | ORAL | 3 refills | Status: DC

## 2017-11-16 NOTE — Telephone Encounter (Signed)
Patient made aware.

## 2017-11-16 NOTE — Telephone Encounter (Signed)
I think she should see neurologist if frequently requiring this medication. Thanks.

## 2017-11-16 NOTE — Telephone Encounter (Signed)
Called and spoke with patient in regards to Cardiac Rehab - Patient stated she is interested in the program. She is concerned about not being able to afford the 30% co-insurance. Patient is going to get in contact with her insurance company. Went ahead and scheduled orientation on 12/19/2017 at 1:30pm. Patient will attend the 1:15pm exc class.

## 2017-11-16 NOTE — Telephone Encounter (Signed)
[facility] faxed a note requesting a 90 day supply of Meclizine 12.5mg .  Refill was sent for #90 on 12/17.  Message sent to Dr Maudie Mercury.

## 2017-11-17 ENCOUNTER — Other Ambulatory Visit: Payer: BLUE CROSS/BLUE SHIELD | Admitting: *Deleted

## 2017-11-17 DIAGNOSIS — I2510 Atherosclerotic heart disease of native coronary artery without angina pectoris: Secondary | ICD-10-CM

## 2017-11-17 LAB — BASIC METABOLIC PANEL
BUN / CREAT RATIO: 23 (ref 12–28)
BUN: 15 mg/dL (ref 8–27)
CO2: 23 mmol/L (ref 20–29)
CREATININE: 0.65 mg/dL (ref 0.57–1.00)
Calcium: 9.5 mg/dL (ref 8.7–10.3)
Chloride: 104 mmol/L (ref 96–106)
GFR, EST AFRICAN AMERICAN: 111 mL/min/{1.73_m2} (ref >59)
GFR, EST NON AFRICAN AMERICAN: 96 mL/min/{1.73_m2} (ref >59)
Glucose: 82 mg/dL (ref 65–99)
Potassium: 4.5 mmol/L (ref 3.5–5.2)
SODIUM: 141 mmol/L (ref 134–144)

## 2017-11-17 LAB — LIPID PANEL
CHOLESTEROL TOTAL: 168 mg/dL (ref 100–199)
Chol/HDL Ratio: 4.3 ratio (ref 0.0–4.4)
HDL: 39 mg/dL — AB (ref >39)
LDL Calculated: 93 mg/dL (ref 0–99)
Triglycerides: 180 mg/dL — ABNORMAL HIGH (ref 0–149)
VLDL CHOLESTEROL CAL: 36 mg/dL (ref 5–40)

## 2017-11-17 NOTE — Telephone Encounter (Signed)
I called the pt and informed her of the message below and she agreed to call Dr Delice Lesch.

## 2017-11-20 ENCOUNTER — Telehealth: Payer: Self-pay | Admitting: *Deleted

## 2017-11-20 DIAGNOSIS — I2510 Atherosclerotic heart disease of native coronary artery without angina pectoris: Secondary | ICD-10-CM

## 2017-11-20 DIAGNOSIS — E785 Hyperlipidemia, unspecified: Secondary | ICD-10-CM

## 2017-11-20 MED ORDER — ROSUVASTATIN CALCIUM 40 MG PO TABS: 40.0000 mg | ORAL_TABLET | Freq: Every day | ORAL | 3 refills | Status: DC

## 2017-11-20 NOTE — Telephone Encounter (Signed)
-----   Message from Nelva Bush, MD sent at 11/19/2017  7:58 PM EST ----- Please let Ms. Khun know that her LDL has improved significantly but is still above goal at 93 (goal < 70). I recommend that we increase rosuvastatin to 40 mg daily and repeat a lipid panel and ALT in ~3 months. Renal function and electrolytes are normal (patient is off ACEI due to symptomatic hypotension).

## 2017-11-20 NOTE — Telephone Encounter (Signed)
Pt has been notified of lab results reviewed by Dr. Saunders Revel. Pt is agreeable to increase Crestor to 40 mg daily, Lipid and Alt 02/16/18. Pt thanked me for my call today.

## 2017-11-22 ENCOUNTER — Other Ambulatory Visit: Payer: Self-pay | Admitting: Family Medicine

## 2017-11-23 ENCOUNTER — Telehealth (HOSPITAL_COMMUNITY): Payer: Self-pay

## 2017-11-23 NOTE — Telephone Encounter (Signed)
Patient called and spoke with Thayer Headings - Patient stated she does not want to do CR - cancelled appts.

## 2017-11-23 NOTE — Addendum Note (Signed)
Addended by: Lianne Cure A on: 11/23/2017 03:27 PM   Modules accepted: Orders

## 2017-11-30 ENCOUNTER — Encounter: Payer: Self-pay | Admitting: Internal Medicine

## 2017-11-30 ENCOUNTER — Ambulatory Visit: Payer: BLUE CROSS/BLUE SHIELD | Admitting: Internal Medicine

## 2017-11-30 ENCOUNTER — Other Ambulatory Visit: Payer: Self-pay | Admitting: Vascular Surgery

## 2017-11-30 ENCOUNTER — Telehealth: Payer: Self-pay | Admitting: Internal Medicine

## 2017-11-30 ENCOUNTER — Encounter: Payer: Self-pay | Admitting: Family Medicine

## 2017-11-30 VITALS — BP 102/44 | HR 88 | Ht 63.0 in | Wt 95.8 lb

## 2017-11-30 DIAGNOSIS — I5181 Takotsubo syndrome: Secondary | ICD-10-CM | POA: Diagnosis not present

## 2017-11-30 DIAGNOSIS — E785 Hyperlipidemia, unspecified: Secondary | ICD-10-CM | POA: Diagnosis not present

## 2017-11-30 DIAGNOSIS — I2510 Atherosclerotic heart disease of native coronary artery without angina pectoris: Secondary | ICD-10-CM

## 2017-11-30 DIAGNOSIS — I739 Peripheral vascular disease, unspecified: Secondary | ICD-10-CM | POA: Diagnosis not present

## 2017-11-30 DIAGNOSIS — I5022 Chronic systolic (congestive) heart failure: Secondary | ICD-10-CM

## 2017-11-30 NOTE — Telephone Encounter (Signed)
Noted on medication list

## 2017-11-30 NOTE — Patient Instructions (Signed)
Medication Instructions:  Your physician recommends that you continue on your current medications as directed. Please refer to the Current Medication list given to you today.   Labwork: None   Testing/Procedures: Your physician has requested that you have an echocardiogram. Echocardiography is a painless test that uses sound waves to create images of your heart. It provides your doctor with information about the size and shape of your heart and how well your heart's chambers and valves are working. This procedure takes approximately one hour. There are no restrictions for this procedure. WITH DEFINITY--LATE February 2019  Follow-Up: Your physician recommends that you schedule a follow-up appointment with PA/NP in about 3 months.        If you need a refill on your cardiac medications before your next appointment, please call your pharmacy.

## 2017-11-30 NOTE — Progress Notes (Signed)
Follow-up Outpatient Visit Date: 11/30/2017  Primary Care Provider: Lucretia Kern, DO [address] [place] [address]  Chief Complaint: Follow-up cardiomyopathy and coronary artery disease  HPI:  Erin [name] is a 62 y.o. 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 I last saw Erin [name] a month ago, at which time she was gradually recovering from her hospitalization in November she continued to have exertional dyspnea with minimal activity but was free of chest pain, palpitations, and lightheadedness. We added low dose lisinopril (2.5 mg daily), which Erin [name] did not tolerate due to symptomatic hypotension.  Today, Erin [name] reports that she continues to feel little better every day.  Exertional dyspnea has improved.  She denies chest pain, palpitations, edema, and orthopnea.  She has not had any significant lightheadedness since discontinuation of lisinopril, despite her blood pressure being lower normal to slightly low at times.  She is tolerating dual antiplatelet therapy with aspirin and ticagrelor well.  --------------------------------------------------------------------------------------------------  Past Medical History:  Diagnosis Date  . Allergy   . Alopecia   . Anal cancer ([place]) 08/14/13   invasive squamous cell ca, s/p radiation 10/20-11/26/14 60.4Gy/85fx and chemo  . Anxiety   . B12 deficiency anemia 09/14/2015  . Blood transfusion without reported diagnosis   . CAD (coronary artery disease), native coronary artery 10/13/2017   DES to mid RCA  . Chronic back pain   . Chronic daily headache 03/29/2013     takes bc powder  . Closed right hip fracture ([place]) 09/10/2015  . Depression   . DM (diabetes mellitus) ([place])    diet controlled; does not check BG  . GERD (gastroesophageal reflux disease)   . History of hiatal hernia   . HLD (hyperlipidemia)   . Hot flashes   . Hyperlipemia 04/11/2014  . Hypokalemia 05/2016  . Hypothyroidism 09/10/2015  . IBS (irritable bowel syndrome) 03/29/2013  . 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 QT prolongation   . Tubular adenoma of colon 09/08/2003  . Vertigo   . Wears glasses    Past Surgical History:  Procedure Laterality Date  . ABDOMINAL AORTOGRAM N/A 08/02/2017   Procedure: ABDOMINAL AORTOGRAM;  Surgeon: Wellington Hampshire, MD;  Location: [facility];  Service: Cardiovascular;  Laterality: N/A;  . AORTA - BILATERAL FEMORAL ARTERY BYPASS GRAFT N/A 08/14/2017   Procedure: AORTA BIFEMORAL BYPASS GRAFT;  Surgeon: Rosetta Posner, MD;  Location: [facility];  Service: Vascular;  Laterality: N/A;  . COLONOSCOPY    . CORONARY STENT INTERVENTION N/A 10/13/2017   Procedure: CORONARY STENT INTERVENTION;  Surgeon: Nelva Bush, MD;  Location: [place] [facility];  Service: Cardiovascular;  Laterality: N/A;  . dental implant    . ECTOPIC PREGNANCY SURGERY    . EMBOLECTOMY N/A 08/14/2017   Procedure: EMBOLECTOMY FEMORAL;  Surgeon: Rosetta Posner, MD;  Location: [facility];  Service: Vascular;  Laterality: N/A;  . FEMORAL-POPLITEAL BYPASS GRAFT Right 08/14/2017   Procedure: Right Femoral to Above Knee Popliteal Bypass Graft using Non-Reversed Greater Saphenous Vein Graft from Right Leg;  Surgeon: Rosetta Posner, MD;  Location: [place];  Service: Vascular;  Laterality: Right;  . FLEXIBLE SIGMOIDOSCOPY N/A 08/14/2013   Procedure: FLEXIBLE  SIGMOIDOSCOPY;  Surgeon: Ladene Artist, MD;  Location: [facility];  Service: Endoscopy;  Laterality: N/A;  . LEFT HEART CATH AND CORONARY ANGIOGRAPHY N/A 10/13/2017   Procedure: LEFT HEART CATH AND CORONARY ANGIOGRAPHY;  Surgeon:  Nelva Bush, MD;  Location: [place] [facility];  Service: Cardiovascular;  Laterality: N/A;  . LOWER EXTREMITY ANGIOGRAPHY Bilateral 08/02/2017   Procedure: Lower Extremity Angiography;  Surgeon: Wellington Hampshire, MD;  Location: [place] [facility];  Service: Cardiovascular;  Laterality: Bilateral;  . MULTIPLE TOOTH EXTRACTIONS    . PILONIDAL CYST EXCISION    . POLYPECTOMY    . THROMBECTOMY FEMORAL ARTERY Right 08/14/2017   Procedure: THROMBECTOMY FEMORAL ARTERY;  Surgeon: Rosetta Posner, MD;  Location: [facility];  Service: Vascular;  Laterality: Right;  . TONSILLECTOMY    . TOTAL HIP ARTHROPLASTY  09/11/2015   Procedure: TOTAL HIP ARTHROPLASTY;  Surgeon: Renette Butters, MD;  Location: [facility];  Service: Orthopedics;;  . ULTRASOUND GUIDANCE FOR VASCULAR ACCESS  10/13/2017   Procedure: Ultrasound Guidance For Vascular Access;  Surgeon: Nelva Bush, MD;  Location: [place] [facility];  Service: Cardiovascular;;    Current Meds  Medication Sig  . acetaminophen (TYLENOL) 500 MG tablet Take 500 mg every 6 (six) hours as needed by mouth for headache (pain).  Marland Kitchen acyclovir ointment (ZOVIRAX) 5 % Apply 1 application topically every 3 (three) hours as needed (fever blisters).  Marland Kitchen aspirin EC 81 MG tablet Take 81 mg by mouth daily.  . Biotin 10000 MCG TABS Take 10,000 mcg by mouth daily.  Marland Kitchen dicyclomine (BENTYL) 10 MG capsule TAKE ONE CAPSULE BY MOUTH THREE TIMES DAILY BEFORE MEALS. DUE FOR FOLLOW UP IN MAY, NEEDS TO SCHEDULE AN APPOINTMENT (Patient taking differently: TAKE ONE CAPSULE (10 MG) BY MOUTH THREE TIMES DAILY)  . fluticasone (FLONASE) 50 MCG/ACT nasal spray USE 1 SPRAY IN EACH NOSTRIL DAILY  . gabapentin (NEURONTIN) 600 MG tablet TAKE 1 TABLET BY MOUTH THREE TIMES DAILY GENERIC EQUIVALENT FOR NEURONTIN  . levothyroxine (SYNTHROID, LEVOTHROID) 75 MCG tablet TAKE 1 TABLET BY MOUTH DAILY. GENERIC EQUIVALENT FOR SYNTHROID (Patient taking differently: TAKE 1 TABLET (75 MCG) BY MOUTH DAILY. GENERIC  EQUIVALENT FOR SYNTHROID)  . LINZESS 145 MCG CAPS capsule TAKE ONE CAPSULE BY MOUTH DAILY BEFORE BREAKFAST (Patient taking differently: Take 145 mcg daily by mouth. )  . meclizine (ANTIVERT) 12.5 MG tablet TAKE 1 TABLET (12.5 MG TOTAL) BY MOUTH 3 (THREE) TIMES DAILY AS NEEDED FOR DIZZINESS.  . metoprolol succinate (TOPROL-XL) 25 MG 24 hr tablet Take 1 tablet (25 mg total) by mouth 2 (two) times daily.  . nitroGLYCERIN (NITROSTAT) 0.4 MG SL tablet Place 1 tablet (0.4 mg total) under the tongue every 5 (five) minutes as needed for chest pain.  Marland Kitchen nystatin-triamcinolone (MYCOLOG II) cream APPLY TO CORNERS OF THE MOUTH TWICE A DAY AS NEEDED FOR CRACKING/BURNING  . omeprazole (PRILOSEC) 40 MG capsule TAKE ONE CAPSULE BY MOUTH DAILY. NEED APPOINTMENT FOR FURTHER REFILLS (Patient taking differently: TAKE ONE CAPSULE (40 MG) BY MOUTH DAILY. NEED APPOINTMENT FOR FURTHER REFILLS)  . potassium chloride SA (K-DUR,KLOR-CON) 20 MEQ tablet Take 40 mEq once by mouth.  . rosuvastatin (CRESTOR) 40 MG tablet Take 1 tablet (40 mg total) by mouth daily.  . ticagrelor (BRILINTA) 90 MG TABS tablet Take 1 tablet (90 mg total) by mouth 2 (two) times daily.  . TRAMADOL HCL PO Take 2 tablets by mouth as needed.  . vitamin B-12 1000 MCG tablet Take 1 tablet (1,000  mcg total) by mouth daily.  . [DISCONTINUED] acyclovir ointment (ZOVIRAX) 5 % Apply 1 application topically every 3 (three) hours. (Patient taking differently: Apply 1 application every 3 (three) hours as needed topically (fever blisters). )  . [DISCONTINUED] Multiple Vitamins-Minerals (PRESERVISION AREDS 2 PO) Take 1 capsule by mouth daily.  . [DISCONTINUED] PARoxetine (PAXIL) 20 MG tablet Take 1 tablet (20 mg total) by mouth daily.    Allergies: Amitiza [lubiprostone]; Nortriptyline; Sulfa antibiotics; Atorvastatin; Claritin [loratadine]; Dexamethasone; and Prednisone  Social History   Socioeconomic History  . Marital status: Married    Spouse name: Not on  file  . Number of children: 0  . Years of education: Not on file  . Highest education level: Not on file  Social Needs  . Financial resource strain: Not on file  . Food insecurity - worry: Not on file  . Food insecurity - inability: Not on file  . Transportation needs - medical: Not on file  . Transportation needs - non-medical: Not on file  Occupational History  . Occupation: caregiver  Tobacco Use  . Smoking status: Former Smoker    Packs/day: 0.50    Types: Cigarettes    Last attempt to quit: 10/07/2014    Years since quitting: 3.1  . Smokeless tobacco: Never Used  Substance and Sexual Activity  . Alcohol use: No    Alcohol/week: 0.0 oz  . Drug use: No  . Sexual activity: Yes    Partners: Male  Other Topics Concern  . Not on file  Social History Narrative   Work or School: homemaker      Home Situation: lives with her husband,[name] takes care of her elderly parents       Spiritual Beliefs: Christian      Lifestyle: no regular exercise, poor diet             Family History  Problem Relation Age of Onset  . Arthritis Mother   . Hyperlipidemia Mother   . Heart disease Mother   . Hypertension Mother   . Stroke Mother 74  . Irritable bowel syndrome Mother   . Thyroid disease Mother   . Heart attack Mother   . Heart disease Father   . Hyperlipidemia Father   . Hypertension Father   . Stroke Father 83  . Thyroid disease Father   . Prostate cancer Father   . Heart attack Father   . Lung cancer Brother   . Heart attack Maternal Grandfather   . Heart attack Maternal Uncle   . Colon cancer Neg Hx   . Rectal cancer Neg Hx   . Stomach cancer Neg Hx     Review of Systems: A 12-system review of systems was performed and was negative except as noted in the HPI.  --------------------------------------------------------------------------------------------------  Physical Exam: BP (!) 102/44   Pulse 88   Ht 5\' 3"  (1.6 m)   Wt 95 lb 12.8 oz (43.5 kg)   SpO2  99%   BMI 16.97 kg/m   General: Very thin woman, seated comfortably in the exam room.  She is accompanied by her husband. HEENT: No conjunctival pallor or scleral icterus. Moist mucous membranes.  OP clear. Neck: Supple without lymphadenopathy, thyromegaly, JVD, or HJR. Lungs: Normal work of breathing. Clear to auscultation bilaterally without wheezes or crackles. Heart: Regular rate and rhythm without murmurs, rubs, or gallops. Non-displaced PMI. Abd: Bowel sounds present. Soft, NT/ND without hepatosplenomegaly Ext: No lower extremity edema.  Absent pedal and radial pulses bilaterally. Skin: Warm  and dry without rash.   Lab Results  Component Value Date   WBC 7.7 10/14/2017   HGB 9.3 (L) 10/14/2017   HCT 29.2 (L) 10/14/2017   MCV 93.6 10/14/2017   PLT 297 10/14/2017    Lab Results  Component Value Date   NA 141 11/17/2017   K 4.5 11/17/2017   CL 104 11/17/2017   CO2 23 11/17/2017   BUN 15 11/17/2017   CREATININE 0.65 11/17/2017   GLUCOSE 82 11/17/2017   ALT 15 08/18/2017    Lab Results  Component Value Date   CHOL 168 11/17/2017   HDL 39 (L) 11/17/2017   LDLCALC 93 11/17/2017   LDLDIRECT 214.0 09/26/2016   TRIG 180 (H) 11/17/2017   CHOLHDL 4.3 11/17/2017    --------------------------------------------------------------------------------------------------  ASSESSMENT AND PLAN: Coronary artery disease without angina No recurrence of chest pain status post PCI to RCA.  Continue secondary prevention, including dual antiplatelet therapy with aspirin and ticagrelor for at least 12 months.  Chronic systolic heart failure secondary to stress-induced cardiomyopathy Findings are most suggestive of stress-induced cardiomyopathy.  Patient appears euvolemic on exam with NYHA class II-III symptoms.  Overall, she is gradually improving.  Unfortunately, she did not tolerate addition of low-dose lisinopril.  We will continue her current dose of metoprolol succinate 25 mg twice  daily.  I will plan to repeat an echocardiogram with [facility] in late February to reassess her LV function.  Hyperlipidemia Goal LDL is less than 70.  It was 93 in December, which prompted [place] to increase rosuvastatin to 40 mg daily.  She is tolerating this well.  I will plan to repeat a lipid panel in about 2 months.  Peripheral vascular disease Patient has chronic pain from her surgical incisions.  No significant claudication.  Continue to follow up with Dr. early.  Follow-up: Return to clinic in 3 months with an APP.  Nelva Bush, MD 11/30/2017 10:04 AM

## 2017-11-30 NOTE — Telephone Encounter (Signed)
° ° °  Pt c/o medication issue:  1. Name of Medication: TRAMADOL HCL PO  2. How are you currently taking this medication (dosage and times per day)? Take 2 tablets by mouth as needed.  3. Are you having a reaction (difficulty breathing--STAT)? no 4. What is your medication issue? Pt is just calling to state the mg of the medication which is 50mg

## 2017-12-01 ENCOUNTER — Telehealth: Payer: Self-pay | Admitting: Nurse Practitioner

## 2017-12-01 ENCOUNTER — Inpatient Hospital Stay: Payer: BLUE CROSS/BLUE SHIELD | Attending: Oncology | Admitting: Oncology

## 2017-12-01 VITALS — BP 117/61 | HR 84 | Temp 98.1°F | Resp 18 | Wt 94.6 lb

## 2017-12-01 DIAGNOSIS — K1230 Oral mucositis (ulcerative), unspecified: Secondary | ICD-10-CM | POA: Diagnosis not present

## 2017-12-01 DIAGNOSIS — I2510 Atherosclerotic heart disease of native coronary artery without angina pectoris: Secondary | ICD-10-CM | POA: Insufficient documentation

## 2017-12-01 DIAGNOSIS — E876 Hypokalemia: Secondary | ICD-10-CM | POA: Diagnosis not present

## 2017-12-01 DIAGNOSIS — E785 Hyperlipidemia, unspecified: Secondary | ICD-10-CM | POA: Diagnosis not present

## 2017-12-01 DIAGNOSIS — Z87891 Personal history of nicotine dependence: Secondary | ICD-10-CM | POA: Diagnosis not present

## 2017-12-01 DIAGNOSIS — Z923 Personal history of irradiation: Secondary | ICD-10-CM | POA: Diagnosis not present

## 2017-12-01 DIAGNOSIS — E538 Deficiency of other specified B group vitamins: Secondary | ICD-10-CM | POA: Diagnosis not present

## 2017-12-01 DIAGNOSIS — L280 Lichen simplex chronicus: Secondary | ICD-10-CM | POA: Insufficient documentation

## 2017-12-01 DIAGNOSIS — C210 Malignant neoplasm of anus, unspecified: Secondary | ICD-10-CM

## 2017-12-01 DIAGNOSIS — K5900 Constipation, unspecified: Secondary | ICD-10-CM | POA: Diagnosis not present

## 2017-12-01 DIAGNOSIS — Z85048 Personal history of other malignant neoplasm of rectum, rectosigmoid junction, and anus: Secondary | ICD-10-CM

## 2017-12-01 DIAGNOSIS — E119 Type 2 diabetes mellitus without complications: Secondary | ICD-10-CM | POA: Insufficient documentation

## 2017-12-01 NOTE — Progress Notes (Signed)
[facility] OFFICE PROGRESS NOTE   Diagnosis: Anal cancer  INTERVAL HISTORY:   Erin [name] returns as scheduled.  She underwent a thrombectomy and right femoral to above-knee popliteal bypass 08/15/2017.  She was admitted with coronary artery disease in November and underwent a percutaneous intervention. She reports losing a significant amount of weight surrounding these procedures.  She is now gaining weight.  No difficulty with bowel function.  No anal pain.  She reports Dr. Fuller Plan does not recommend an endoscopic evaluation until September of this year.  She has pain in the thighs.  She plans to see her neurologist.  She relates a current rash at the upper back to the use of tramadol.  Objective:  Vital signs in last 24 hours:  Blood pressure 117/61, pulse 84, temperature 98.1 F (36.7 C), temperature source Oral, resp. rate 18, weight 94 lb 9.6 oz (42.9 kg), SpO2 99 %.    HEENT: Neck without mass Lymphatics: No cervical, supraclavicular, axillary, or inguinal nodes Resp: Lungs clear bilaterally, no respiratory distress Cardio: Regular rate and rhythm GI: No hepatosplenomegaly, no mass, mild diffuse tenderness Vascular: No leg edema Rectal: Radiation pigmentation and telangiectasias at the perineum, stenotic anal canal without a palpable mass.  There is irregular mucosa at the anterior anal verge, right anterior hemorrhoid at the anal verge, small amount of blood on the examination glove Skin: Few tiny erythematous lesions, some scabbed at the upper back    Lab Results:   Medications: I have reviewed the patient's current medications.   Assessment/Plan: 1. Squamous cell carcinoma of the anus, clinical stage II (T2 N0), status post endoscopic biopsy 08/14/2013 confirming invasive squamous cell carcinoma. Initiation of radiation and cycle 1 5-FU/mitomycin C. 09/09/2013. Cycle 2 5-FU/mitomycin-C (dose reduction) completed beginning 10/08/2013, radiation completed  10/16/2013. 2. Rectal pain, constipation and bleeding secondary to #1. Resolved. 3. History of Iron deficiency anemia. 4. Vitamin B12 deficiency. 5. Diabetes. 6. Hyperlipidemia. 7. Neurodermatitis. 8. Tobacco use. She reports she has quit smoking. 9. Mucositis following cycle 1 and cycle 2 5-FU/mitomycin C.  10. Skin rash. Initially felt to be related to the chemotherapy though somewhat atypical in appearance and distribution. The rash has resolved. 11. 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  12. History of hypokalemia. 13. Status post sigmoidoscopy 03/10/2014 with findings of distal rectal/anal canal nodularity measuring 2 cm x 2 cm. Biopsy showed benign ulcerated anorectal junction mucosa and inflammatory debris. Within the debris there were markedly atypical epithelial cell fragments and single cells. 14. Status post sigmoidoscopy 09/23/2014, area of abnormal mucosa in the distal rectum/anal canal-friable, erythematous, scarring; multiple biopsies performed. Pathology revealed no dysplasia or malignancy. 15. Sigmoidoscopy 10/21/2015-granular erythematous mucosa at the distal rectum adjacent to the dentate line, improved compared to 2015 16. Bilateral hip pain. Status post CT abdomen/pelvis 08/11/2014 with no etiology for the pain identified. Resolved after physical therapy. 17. Right hip fracture following a fall 09/10/2015 status post total hip arthroplasty 09/11/2015 18. Aortobifemoral bypass, right leg femoral-popliteal bypass September 2018 19. Coronary artery disease-status post PCI to the right coronary artery November 2018   Disposition: [name] remains in clinical remission from anal cancer.  There is no anal mass on exam today.  I suspect the bleeding is related to hemorrhoids, anal stenosis, and friable mucosa from radiation.  She will schedule a sigmoidoscopy with Dr. Fuller Plan.  [name] will return for an office visit in  September 2019.  This will be 5  years from the diagnosis of anal cancer.  15 minutes were spent with the patient today.  The majority of the time was used for counseling and coordination of care.  Betsy Coder, MD  12/01/2017  11:50 AM

## 2017-12-01 NOTE — Telephone Encounter (Signed)
Gave avs and calendar for September  °

## 2017-12-05 ENCOUNTER — Ambulatory Visit: Payer: BLUE CROSS/BLUE SHIELD | Admitting: Family Medicine

## 2017-12-05 ENCOUNTER — Encounter: Payer: Self-pay | Admitting: Family Medicine

## 2017-12-05 VITALS — BP 90/60 | HR 90 | Temp 97.4°F | Ht 63.0 in | Wt 97.5 lb

## 2017-12-05 DIAGNOSIS — R202 Paresthesia of skin: Secondary | ICD-10-CM | POA: Diagnosis not present

## 2017-12-05 DIAGNOSIS — G8918 Other acute postprocedural pain: Secondary | ICD-10-CM

## 2017-12-05 DIAGNOSIS — F339 Major depressive disorder, recurrent, unspecified: Secondary | ICD-10-CM

## 2017-12-05 MED ORDER — DULOXETINE HCL 20 MG PO CPEP: 20.0000 mg | ORAL_CAPSULE | Freq: Every day | ORAL | 3 refills | Status: DC

## 2017-12-05 NOTE — Patient Instructions (Signed)
Follow up 4-6 weeks with Dr. Maudie Mercury  Start the cymbalta and take once daily.  Follow up with surgeon regarding pain at surgical sites.  Follow up with back doctor.

## 2017-12-05 NOTE — Progress Notes (Signed)
HPI:  Acute visit for pain at surgical sites.  She provides an unusual report that the nurse at her surgeon's office was not very nice when she called and advised that they could not see her for this and that she should not have pain.  Reports that since her vascular surgery on her legs, she has had severe pain exactly at the incision sites.  Reports this is a stabbing pain and is constant.  Reports it started with the surgery.  Denies weakness, skin rash, fevers, weight loss.  Though thin, she actually has gained some weight.  Reports she is eating well and overall her energy has improved some.  She also occasionally gets numbness down the backs of her legs.  This can intermittently be on the right or the left.  She has a history of low back degenerative disc disease and sees orthopedics for this.  She has had injections in the past.  She is on mild depression and diffuse random pains and aches.  She has been through a lot.  Denies suicidal ideation or thoughts of self-harm. Neurontin helps.  Biofreeze helps.  Tramadol does not agree with her.  ROS: See pertinent positives and negatives per HPI.  Past Medical History:  Diagnosis Date  . Allergy   . Alopecia   . Anal cancer ([place]) 08/14/13   invasive squamous cell ca, s/p radiation 10/20-11/26/14 60.4Gy/38fx and chemo  . Anxiety   . B12 deficiency anemia 09/14/2015  . Blood transfusion without reported diagnosis   . CAD (coronary artery disease), native coronary artery 10/13/2017   DES to mid RCA  . Chronic back pain   . Chronic daily headache 03/29/2013   takes bc powder  . Closed right hip fracture ([place]) 09/10/2015  . Depression   . DM (diabetes mellitus) ([place])    diet controlled; does not check BG  . GERD (gastroesophageal reflux disease)   . History of hiatal hernia   . HLD (hyperlipidemia)   . Hot flashes   . Hyperlipemia 04/11/2014  . Hypokalemia 05/2016  . Hypothyroidism 09/10/2015  . IBS (irritable bowel syndrome) 03/29/2013  .  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 QT prolongation   . Tubular adenoma of colon 09/08/2003  . Vertigo   . Wears glasses     Past Surgical History:  Procedure Laterality Date  . ABDOMINAL AORTOGRAM N/A 08/02/2017   Procedure: ABDOMINAL AORTOGRAM;  Surgeon: Wellington Hampshire, MD;  Location: [facility];  Service: Cardiovascular;  Laterality: N/A;  . AORTA - BILATERAL FEMORAL ARTERY BYPASS GRAFT N/A 08/14/2017   Procedure: AORTA BIFEMORAL BYPASS GRAFT;  Surgeon: Rosetta Posner, MD;  Location: [facility] OR;  Service: Vascular;  Laterality: N/A;  . COLONOSCOPY    . CORONARY STENT INTERVENTION N/A 10/13/2017   Procedure: CORONARY STENT INTERVENTION;  Surgeon: Nelva Bush, MD;  Location: [facility];  Service: Cardiovascular;  Laterality: N/A;  . dental implant    . ECTOPIC PREGNANCY SURGERY    . EMBOLECTOMY N/A 08/14/2017   Procedure: EMBOLECTOMY FEMORAL;  Surgeon: Rosetta Posner, MD;  [facility];  Service: Vascular;  Laterality: N/A;  . FEMORAL-POPLITEAL BYPASS GRAFT Right 08/14/2017   Procedure: Right Femoral to Above Knee Popliteal Bypass Graft using Non-Reversed Greater Saphenous Vein Graft from Right Leg;  Surgeon: Rosetta Posner, MD;  Location: [facility];  Service: Vascular;  Laterality: Right;  . FLEXIBLE SIGMOIDOSCOPY N/A 08/14/2013   Procedure: FLEXIBLE SIGMOIDOSCOPY;  Surgeon: Ladene Artist, MD;  Location: [facility];  Service: Endoscopy;  Laterality: N/A;  . LEFT HEART CATH AND CORONARY ANGIOGRAPHY N/A 10/13/2017   Procedure: LEFT HEART CATH AND CORONARY ANGIOGRAPHY;  Surgeon: Nelva Bush, MD;  Location: [facility];  Service: Cardiovascular;  Laterality: N/A;  . LOWER EXTREMITY ANGIOGRAPHY Bilateral 08/02/2017   Procedure: Lower Extremity Angiography;  Surgeon: Wellington Hampshire, MD;  Location: [facility];  Service: Cardiovascular;  Laterality: Bilateral;  . MULTIPLE TOOTH EXTRACTIONS    . PILONIDAL CYST EXCISION    . POLYPECTOMY    .  THROMBECTOMY FEMORAL ARTERY Right 08/14/2017   Procedure: THROMBECTOMY FEMORAL ARTERY;  Surgeon: Rosetta Posner, MD;  Location: [facility];  Service: Vascular;  Laterality: Right;  . TONSILLECTOMY    . TOTAL HIP ARTHROPLASTY  09/11/2015   Procedure: TOTAL HIP ARTHROPLASTY;  Surgeon: Renette Butters, MD;  Location: [facility];  Service: Orthopedics;;  . ULTRASOUND GUIDANCE FOR VASCULAR ACCESS  10/13/2017   Procedure: Ultrasound Guidance For Vascular Access;  Surgeon: Nelva Bush, MD;  Location: [facility];  Service: Cardiovascular;;    Family History  Problem Relation Age of Onset  . Arthritis Mother   . Hyperlipidemia Mother   . Heart disease Mother   . Hypertension Mother   . Stroke Mother 24  . Irritable bowel syndrome Mother   . Thyroid disease Mother   . Heart attack Mother   . Heart disease Father   . Hyperlipidemia Father   . Hypertension Father   . Stroke Father 34  . Thyroid disease Father   . Prostate cancer Father   . Heart attack Father   . Lung cancer Brother   . Heart attack Maternal Grandfather   . Heart attack Maternal Uncle   . Colon cancer Neg Hx   . Rectal cancer Neg Hx   . Stomach cancer Neg Hx     Social History   Socioeconomic History  . Marital status: Married    Spouse name: None  . Number of children: 0  . Years of education: None  . Highest education level: None  Social Needs  . Financial resource strain: None  . Food insecurity - worry: None  . Food insecurity - inability: None  . Transportation needs - medical: None  . Transportation needs - non-medical: None  Occupational History  . Occupation: caregiver  Tobacco Use  . Smoking status: Former Smoker    Packs/day: 0.50    Types: Cigarettes    Last attempt to quit: 10/07/2014    Years since quitting: 3.1  . Smokeless tobacco: Never Used  Substance and Sexual Activity  . Alcohol use: No    Alcohol/week: 0.0 oz  . Drug use: No  . Sexual activity: Yes    Partners: Male  Other  Topics Concern  . None  Social History Narrative   Work or School: homemaker      Home Situation: lives with her husband,[name] takes care of her elderly parents       Spiritual Beliefs: Christian      Lifestyle: no regular exercise, poor diet              Current Outpatient Medications:  .  acetaminophen (TYLENOL) 500 MG tablet, Take 500 mg every 6 (six) hours as needed by mouth for headache (pain)., Disp: , Rfl:  .  acyclovir ointment (ZOVIRAX) 5 %, Apply 1 application topically every 3 (three) hours as needed (fever blisters)., Disp: , Rfl:  .  aspirin EC  81 MG tablet, Take 81 mg by mouth daily., Disp: , Rfl:  .  Biotin 10000 MCG TABS, Take 10,000 mcg by mouth daily., Disp: , Rfl:  .  dicyclomine (BENTYL) 10 MG capsule, TAKE ONE CAPSULE BY MOUTH THREE TIMES DAILY BEFORE MEALS. DUE FOR FOLLOW UP IN MAY, NEEDS TO SCHEDULE AN APPOINTMENT (Patient taking differently: TAKE ONE CAPSULE (10 MG) BY MOUTH THREE TIMES DAILY), Disp: 270 capsule, Rfl: 1 .  fluticasone (FLONASE) 50 MCG/ACT nasal spray, USE 1 SPRAY IN EACH NOSTRIL DAILY, Disp: 48 g, Rfl: 1 .  gabapentin (NEURONTIN) 600 MG tablet, TAKE 1 TABLET BY MOUTH THREE TIMES DAILY GENERIC EQUIVALENT FOR NEURONTIN, Disp: 270 tablet, Rfl: 1 .  levothyroxine (SYNTHROID, LEVOTHROID) 75 MCG tablet, TAKE 1 TABLET BY MOUTH DAILY. GENERIC EQUIVALENT FOR SYNTHROID (Patient taking differently: TAKE 1 TABLET (75 MCG) BY MOUTH DAILY. GENERIC EQUIVALENT FOR SYNTHROID), Disp: 90 tablet, Rfl: 1 .  LINZESS 145 MCG CAPS capsule, TAKE ONE CAPSULE BY MOUTH DAILY BEFORE BREAKFAST (Patient taking differently: Take 145 mcg daily by mouth. ), Disp: 90 capsule, Rfl: 3 .  meclizine (ANTIVERT) 12.5 MG tablet, TAKE 1 TABLET (12.5 MG TOTAL) BY MOUTH 3 (THREE) TIMES DAILY AS NEEDED FOR DIZZINESS., Disp: 30 tablet, Rfl: 0 .  metoprolol succinate (TOPROL-XL) 25 MG 24 hr tablet, Take 1 tablet (25 mg total) by mouth 2 (two) times daily., Disp: 180 tablet, Rfl: 3 .   nitroGLYCERIN (NITROSTAT) 0.4 MG SL tablet, Place 1 tablet (0.4 mg total) under the tongue every 5 (five) minutes as needed for chest pain., Disp: 100 tablet, Rfl: 1 .  nystatin-triamcinolone (MYCOLOG II) cream, APPLY TO CORNERS OF THE MOUTH TWICE A DAY AS NEEDED FOR CRACKING/BURNING, Disp: , Rfl: 2 .  omeprazole (PRILOSEC) 40 MG capsule, TAKE ONE CAPSULE BY MOUTH DAILY. NEED APPOINTMENT FOR FURTHER REFILLS (Patient taking differently: TAKE ONE CAPSULE (40 MG) BY MOUTH DAILY. NEED APPOINTMENT FOR FURTHER REFILLS), Disp: 90 capsule, Rfl: 1 .  potassium chloride SA (K-DUR,KLOR-CON) 20 MEQ tablet, Take 40 mEq once by mouth., Disp: , Rfl: 1 .  rosuvastatin (CRESTOR) 40 MG tablet, Take 1 tablet (40 mg total) by mouth daily., Disp: 90 tablet, Rfl: 3 .  ticagrelor (BRILINTA) 90 MG TABS tablet, Take 1 tablet (90 mg total) by mouth 2 (two) times daily., Disp: 180 tablet, Rfl: 3 .  vitamin B-12 1000 MCG tablet, Take 1 tablet (1,000 mcg total) by mouth daily., Disp: 30 tablet, Rfl: 0 .  DULoxetine (CYMBALTA) 20 MG capsule, Take 1 capsule (20 mg total) by mouth daily., Disp: 30 capsule, Rfl: 3  EXAM:  Vitals:   12/05/17 1455  BP: 90/60  Pulse: 90  Temp: (!) 97.4 F (36.3 C)  SpO2: 98%    Body mass index is 17.27 kg/m.  GENERAL: vitals reviewed and listed above, alert, oriented, appears well hydrated and in no acute distress  HEENT: atraumatic, conjunttiva clear, no obvious abnormalities on inspection of external nose and ears  NECK: no obvious masses on inspection  LUNGS: clear to auscultation bilaterally, no wheezes, rales or rhonchi, good air movement  CV: HRRR, no peripheral edema  MS: antalgic gait, she has normal DTRs, sensitivity to light touch and strength throughout in the lower extremities, she is extremely tender over excisional sites from recent vascular surgery -seems to be the primary location of pain/tenderness, I do not see any signs of infection or rash, he does have palpable  density to the tissue tissue in these areas  PSYCH: pleasant and  cooperative, no obvious depression or anxiety  ASSESSMENT AND PLAN:  Discussed the following assessment and plan:  Pain at surgical site  Paresthesia  Depression, recurrent ([place])  -Lower extremity neurological exam is intact, however, she hass opted to see her orthopedic doctor about the paresthesiasin the posterior legs which may be related to her back issues -She has extreme sensitivity over surgical sites and she is very worried about what seems to be scar tissue in these areas -I have advised her to follow-up with her surgeon about this -We talked about various options for pain and we opted to try Cymbalta which may help with pain and depression and weight gain -She reports she is eating better and her weight is coming up, offer nutritional referral, but she declined -We will follow closely in 4-6 weeks -he opted not to check labs today, but we may want to check her electrolytes and B12 at her next visit if she is not getting better -Patient advised to return or notify a doctor immediately if symptoms worsen or persist or new concerns arise.  Patient Instructions  Follow up 4-6 weeks with Dr. Maudie Mercury  Start the cymbalta and take once daily.  Follow up with surgeon regarding pain at surgical sites.  Follow up with back doctor.     Colin Benton R., DO

## 2017-12-18 ENCOUNTER — Other Ambulatory Visit: Payer: Self-pay | Admitting: Gastroenterology

## 2017-12-18 ENCOUNTER — Ambulatory Visit: Payer: BLUE CROSS/BLUE SHIELD | Admitting: Family

## 2017-12-18 DIAGNOSIS — K2970 Gastritis, unspecified, without bleeding: Secondary | ICD-10-CM

## 2017-12-18 DIAGNOSIS — R112 Nausea with vomiting, unspecified: Secondary | ICD-10-CM

## 2017-12-19 ENCOUNTER — Ambulatory Visit (HOSPITAL_COMMUNITY): Payer: BLUE CROSS/BLUE SHIELD

## 2017-12-25 ENCOUNTER — Ambulatory Visit (HOSPITAL_COMMUNITY): Payer: BLUE CROSS/BLUE SHIELD

## 2017-12-27 ENCOUNTER — Ambulatory Visit (HOSPITAL_COMMUNITY): Payer: BLUE CROSS/BLUE SHIELD

## 2017-12-29 ENCOUNTER — Ambulatory Visit (HOSPITAL_COMMUNITY): Payer: BLUE CROSS/BLUE SHIELD

## 2017-12-30 ENCOUNTER — Other Ambulatory Visit: Payer: Self-pay | Admitting: Family Medicine

## 2018-01-01 ENCOUNTER — Ambulatory Visit (HOSPITAL_COMMUNITY): Payer: BLUE CROSS/BLUE SHIELD

## 2018-01-01 NOTE — Telephone Encounter (Signed)
I left a message for the pt to return my call. 

## 2018-01-01 NOTE — Telephone Encounter (Signed)
I called the pt and her husband stated she has been taking this daily.  Mr Reynolds is aware the Rx was sent to the pts pharmacy.

## 2018-01-01 NOTE — Telephone Encounter (Signed)
Erin [name],  Please call pt. See if she has been taking the potassium daily all along? If so, ok to refill. Potassium has been normal on last several checks.

## 2018-01-03 ENCOUNTER — Ambulatory Visit (HOSPITAL_COMMUNITY): Payer: BLUE CROSS/BLUE SHIELD

## 2018-01-05 ENCOUNTER — Ambulatory Visit (HOSPITAL_COMMUNITY): Payer: BLUE CROSS/BLUE SHIELD

## 2018-01-08 ENCOUNTER — Ambulatory Visit (HOSPITAL_COMMUNITY): Payer: BLUE CROSS/BLUE SHIELD

## 2018-01-09 ENCOUNTER — Encounter: Payer: Self-pay | Admitting: Family Medicine

## 2018-01-09 ENCOUNTER — Ambulatory Visit: Payer: BLUE CROSS/BLUE SHIELD | Admitting: Family Medicine

## 2018-01-09 VITALS — BP 102/60 | HR 75 | Temp 97.9°F | Ht 63.0 in | Wt 99.3 lb

## 2018-01-09 DIAGNOSIS — D649 Anemia, unspecified: Secondary | ICD-10-CM | POA: Diagnosis not present

## 2018-01-09 DIAGNOSIS — E039 Hypothyroidism, unspecified: Secondary | ICD-10-CM

## 2018-01-09 DIAGNOSIS — R52 Pain, unspecified: Secondary | ICD-10-CM

## 2018-01-09 DIAGNOSIS — E876 Hypokalemia: Secondary | ICD-10-CM

## 2018-01-09 DIAGNOSIS — F339 Major depressive disorder, recurrent, unspecified: Secondary | ICD-10-CM | POA: Diagnosis not present

## 2018-01-09 DIAGNOSIS — E538 Deficiency of other specified B group vitamins: Secondary | ICD-10-CM

## 2018-01-09 LAB — CBC
HEMATOCRIT: 36 % (ref 36.0–46.0)
HEMOGLOBIN: 12 g/dL (ref 12.0–15.0)
MCHC: 33.2 g/dL (ref 30.0–36.0)
MCV: 95.7 fl (ref 78.0–100.0)
Platelets: 318 10*3/uL (ref 150.0–400.0)
RBC: 3.77 Mil/uL — AB (ref 3.87–5.11)
RDW: 12.8 % (ref 11.5–15.5)
WBC: 7.9 10*3/uL (ref 4.0–10.5)

## 2018-01-09 LAB — BASIC METABOLIC PANEL
BUN: 17 mg/dL (ref 6–23)
CHLORIDE: 103 meq/L (ref 96–112)
CO2: 31 meq/L (ref 19–32)
CREATININE: 0.71 mg/dL (ref 0.40–1.20)
Calcium: 9.5 mg/dL (ref 8.4–10.5)
GFR: 88.84 mL/min (ref >60.00)
Glucose, Bld: 81 mg/dL (ref 70–99)
POTASSIUM: 3.9 meq/L (ref 3.5–5.1)
Sodium: 140 mEq/L (ref 135–145)

## 2018-01-09 LAB — TSH: TSH: 0.49 u[IU]/mL (ref 0.35–4.50)

## 2018-01-09 LAB — VITAMIN B12: Vitamin B-12: 1031 pg/mL — ABNORMAL HIGH (ref 211–911)

## 2018-01-09 NOTE — Progress Notes (Signed)
HPI:  Erin [name] is a very pleasant 62 year old with unfortunately a very complicated past medical history.  She had extensive vascular surgery on her legs recently and has been dealing with a lot of pain around the surgical sites.  She also has chronic back pain with radicular symptoms at times, sees a specialist about this.  She has some pain and numbness down the backs of her legs at times.  She does have a history of B12 deficiency.  We started her on Cymbalta at her last visit for depression, pain and also to perhaps help bring her weight back up.  Today she reports she is feeling better than she has in years.  She feels like the Cymbalta has been magically cured her pain, neuropathy symptoms, has improved her mood and has helped her to gain weight.  Reports she feels great.  Denies pain, numbness, malaise. Her husband is very concerned about her potassium.  He wants to recheck it today.  She has had several episodes of low potassium in the past.  She takes  K-Dur, 20 mEq daily. Due for lab check with cbc, tsh and B12.  ROS: See pertinent positives and negatives per HPI.  Past Medical History:  Diagnosis Date  . Allergy   . Alopecia   . 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 Anxiety   . B12 deficiency anemia 09/14/2015  . Blood transfusion without reported diagnosis   . CAD (coronary artery disease), native coronary artery 10/13/2017   DES to mid RCA  . Chronic back pain   . Chronic daily headache 03/29/2013   takes bc powder  . Closed right hip fracture ([place]) 09/10/2015  . Depression   . DM (diabetes mellitus) ([place])    diet controlled; does not check BG  . GERD (gastroesophageal reflux disease)   . History of hiatal hernia   . HLD (hyperlipidemia)   . Hot flashes   . Hyperlipemia 04/11/2014  . Hypokalemia 05/2016  . Hypothyroidism 09/10/2015  . IBS (irritable bowel syndrome) 03/29/2013  . 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 QT prolongation   . Tubular adenoma of colon 09/08/2003  . Vertigo   . Wears glasses     Past Surgical History:  Procedure Laterality Date  . ABDOMINAL AORTOGRAM N/A 08/02/2017   Procedure: ABDOMINAL AORTOGRAM;  Surgeon: Wellington Hampshire, MD;  Location: [place] [facility];  Service: Cardiovascular;  Laterality: N/A;  . AORTA - BILATERAL FEMORAL ARTERY BYPASS GRAFT N/A 08/14/2017   Procedure: AORTA BIFEMORAL BYPASS GRAFT;  Surgeon: Rosetta Posner, MD;  Location: [facility];  Service: Vascular;  Laterality: N/A;  . COLONOSCOPY    . CORONARY STENT INTERVENTION N/A 10/13/2017   Procedure: CORONARY STENT INTERVENTION;  Surgeon: Nelva Bush, MD;  Location: [place] [facility];  Service: Cardiovascular;  Laterality: N/A;  . dental implant    . ECTOPIC PREGNANCY SURGERY    . EMBOLECTOMY N/A 08/14/2017   Procedure: EMBOLECTOMY FEMORAL;  Surgeon: Rosetta Posner, MD;  Location: [facility];  Service: Vascular;  Laterality: N/A;  . FEMORAL-POPLITEAL BYPASS GRAFT Right 08/14/2017   Procedure: Right Femoral to Above Knee Popliteal Bypass Graft using Non-Reversed Greater Saphenous Vein Graft from Right Leg;  Surgeon: Rosetta Posner, MD;  Location: [place];  Service: Vascular;  Laterality: Right;  . FLEXIBLE SIGMOIDOSCOPY N/A 08/14/2013   Procedure: FLEXIBLE SIGMOIDOSCOPY;  Surgeon: Ladene Artist, MD;  Location: [facility];  Service: Endoscopy;  Laterality: N/A;  . LEFT HEART CATH AND CORONARY ANGIOGRAPHY N/A 10/13/2017   Procedure: LEFT HEART CATH AND CORONARY ANGIOGRAPHY;  Surgeon: Nelva Bush, MD;  Location: [place] [facility];  Service: Cardiovascular;  Laterality: N/A;  . LOWER EXTREMITY ANGIOGRAPHY Bilateral 08/02/2017   Procedure: Lower Extremity Angiography;  Surgeon: Wellington Hampshire, MD;  Location: [place] [facility];  Service: Cardiovascular;  Laterality: Bilateral;  . MULTIPLE TOOTH EXTRACTIONS    . PILONIDAL CYST EXCISION    . POLYPECTOMY    . THROMBECTOMY FEMORAL  ARTERY Right 08/14/2017   Procedure: THROMBECTOMY FEMORAL ARTERY;  Surgeon: Rosetta Posner, MD;  Location: [place];  Service: Vascular;  Laterality: Right;  . TONSILLECTOMY    . TOTAL HIP ARTHROPLASTY  09/11/2015   Procedure: TOTAL HIP ARTHROPLASTY;  Surgeon: Renette Butters, MD;  Location: [place];  Service: Orthopedics;;  . ULTRASOUND GUIDANCE FOR VASCULAR ACCESS  10/13/2017   Procedure: Ultrasound Guidance For Vascular Access;  Surgeon: Nelva Bush, MD;  Location: [place] [facility];  Service: Cardiovascular;;    Family History  Problem Relation Age of Onset  . Arthritis Mother   . Hyperlipidemia Mother   . Heart disease Mother   . Hypertension Mother   . Stroke Mother 87  . Irritable bowel syndrome Mother   . Thyroid disease Mother   . Heart attack Mother   . Heart disease Father   . Hyperlipidemia Father   . Hypertension Father   . Stroke Father 70  . Thyroid disease Father   . Prostate cancer Father   . Heart attack Father   . Lung cancer Brother   . Heart attack Maternal Grandfather   . Heart attack Maternal Uncle   . Colon cancer Neg Hx   . Rectal cancer Neg Hx   . Stomach cancer Neg Hx     Social History   Socioeconomic History  . Marital status: Married    Spouse name: None  . Number of children: 0  . Years of education: None  . Highest education level: None  Social Needs  . Financial resource strain: None  . Food insecurity - worry: None  . Food insecurity - inability: None  . Transportation needs - medical: None  . Transportation needs - non-medical: None  Occupational History  . Occupation: caregiver  Tobacco Use  . Smoking status: Former Smoker    Packs/day: 0.50    Types: Cigarettes    Last attempt to quit: 10/07/2014    Years since quitting: 3.2  . Smokeless tobacco: Never Used  Substance and Sexual Activity  . Alcohol use: No    Alcohol/week: 0.0 oz  . Drug use: No  . Sexual activity: Yes    Partners: Male  Other Topics Concern  . None   Social History Narrative   Work or School: homemaker      Home Situation: lives with her husband,[name] takes care of her elderly parents       Spiritual Beliefs: Christian      Lifestyle: no regular exercise, poor diet              Current Outpatient Medications:  .  acetaminophen (TYLENOL) 500 MG tablet, Take 500 mg every 6 (six) hours as needed by mouth for headache (pain)., Disp: , Rfl:  .  acyclovir ointment (ZOVIRAX) 5 %, Apply 1 application topically every 3 (three) hours as needed (fever blisters)., Disp: , Rfl:  .  aspirin EC 81 MG  tablet, Take 81 mg by mouth daily., Disp: , Rfl:  .  Biotin 10000 MCG TABS, Take 10,000 mcg by mouth daily., Disp: , Rfl:  .  dicyclomine (BENTYL) 10 MG capsule, TAKE ONE CAPSULE BY MOUTH THREE TIMES DAILY BEFORE MEALS. DUE FOR FOLLOW UP IN MAY, NEEDS TO SCHEDULE AN APPOINTMENT, Disp: 270 capsule, Rfl: 1 .  DULoxetine (CYMBALTA) 20 MG capsule, Take 1 capsule (20 mg total) by mouth daily., Disp: 30 capsule, Rfl: 3 .  fluticasone (FLONASE) 50 MCG/ACT nasal spray, USE 1 SPRAY IN EACH NOSTRIL DAILY, Disp: 48 g, Rfl: 1 .  gabapentin (NEURONTIN) 600 MG tablet, TAKE 1 TABLET BY MOUTH THREE TIMES DAILY GENERIC EQUIVALENT FOR NEURONTIN, Disp: 270 tablet, Rfl: 1 .  levothyroxine (SYNTHROID, LEVOTHROID) 75 MCG tablet, TAKE 1 TABLET BY MOUTH DAILY. GENERIC EQUIVALENT FOR SYNTHROID (Patient taking differently: TAKE 1 TABLET (75 MCG) BY MOUTH DAILY. GENERIC EQUIVALENT FOR SYNTHROID), Disp: 90 tablet, Rfl: 1 .  LINZESS 145 MCG CAPS capsule, TAKE ONE CAPSULE BY MOUTH DAILY BEFORE BREAKFAST (Patient taking differently: Take 145 mcg daily by mouth. ), Disp: 90 capsule, Rfl: 3 .  meclizine (ANTIVERT) 12.5 MG tablet, TAKE 1 TABLET (12.5 MG TOTAL) BY MOUTH 3 (THREE) TIMES DAILY AS NEEDED FOR DIZZINESS., Disp: 30 tablet, Rfl: 0 .  metoprolol succinate (TOPROL-XL) 25 MG 24 hr tablet, Take 1 tablet (25 mg total) by mouth 2 (two) times daily., Disp: 180 tablet, Rfl: 3 .   nitroGLYCERIN (NITROSTAT) 0.4 MG SL tablet, Place 1 tablet (0.4 mg total) under the tongue every 5 (five) minutes as needed for chest pain., Disp: 100 tablet, Rfl: 1 .  nystatin-triamcinolone (MYCOLOG II) cream, APPLY TO CORNERS OF THE MOUTH TWICE A DAY AS NEEDED FOR CRACKING/BURNING, Disp: , Rfl: 2 .  omeprazole (PRILOSEC) 40 MG capsule, TAKE ONE CAPSULE BY MOUTH DAILY. NEED APPOINTMENT FOR FURTHER REFILLS, Disp: 90 capsule, Rfl: 1 .  potassium chloride SA (K-DUR,KLOR-CON) 20 MEQ tablet, TAKE 1 TABLET BY MOUTH DAILY, Disp: 90 tablet, Rfl: 1 .  rosuvastatin (CRESTOR) 40 MG tablet, Take 1 tablet (40 mg total) by mouth daily., Disp: 90 tablet, Rfl: 3 .  ticagrelor (BRILINTA) 90 MG TABS tablet, Take 1 tablet (90 mg total) by mouth 2 (two) times daily., Disp: 180 tablet, Rfl: 3 .  traMADol (ULTRAM) 50 MG tablet, TAKE 1 TABLET BY MOUTH EVERY 6 HOURS AS NEEDED, Disp: 30 tablet, Rfl: 0 .  vitamin B-12 1000 MCG tablet, Take 1 tablet (1,000 mcg total) by mouth daily., Disp: 30 tablet, Rfl: 0  EXAM:  Vitals:   01/09/18 1350  BP: 102/60  Pulse: 75  Temp: 97.9 F (36.6 C)    Body mass index is 17.59 kg/m.  GENERAL: vitals reviewed and listed above, alert, oriented, appears well hydrated and in no acute distress  HEENT: atraumatic, conjunttiva clear, no obvious abnormalities on inspection of external nose and ears  NECK: no obvious masses on inspection  LUNGS: clear to auscultation bilaterally, no wheezes, rales or rhonchi, good air movement  CV: HRRR, no peripheral edema  MS: moves all extremities without noticeable abnormality  PSYCH: pleasant and cooperative, no obvious depression or anxiety  ASSESSMENT AND PLAN:  Discussed the following assessment and plan:  Pain  Depression, recurrent (HCC)  B12 deficiency - Plan: Vitamin B12  Hypokalemia - Plan: Basic metabolic panel  Anemia, unspecified type - Plan: CBC  Hypothyroidism, unspecified type - Plan: TSH  -Labs per orders -I  am so glad she is doing better, will  continue the Cymbalta -Encouraged her to continue to work on a healthy diet and weight gain -Follow-up 3 months  Patient Instructions  BEFORE YOU LEAVE: -Labs -follow up: Follow-up in 3 months, also please schedule a new patient visit for her husband when it is convenient for him  I am so glad you are feeling better!    Lucretia Kern, DO

## 2018-01-09 NOTE — Patient Instructions (Signed)
BEFORE YOU LEAVE: -Labs -follow up: Follow-up in 3 months, also please schedule a new patient visit for her husband when it is convenient for him  I am so glad you are feeling better!

## 2018-01-10 ENCOUNTER — Ambulatory Visit (HOSPITAL_COMMUNITY): Payer: BLUE CROSS/BLUE SHIELD

## 2018-01-12 ENCOUNTER — Ambulatory Visit (HOSPITAL_COMMUNITY): Payer: BLUE CROSS/BLUE SHIELD

## 2018-01-15 ENCOUNTER — Ambulatory Visit (HOSPITAL_COMMUNITY): Payer: BLUE CROSS/BLUE SHIELD

## 2018-01-17 ENCOUNTER — Ambulatory Visit (HOSPITAL_COMMUNITY): Payer: BLUE CROSS/BLUE SHIELD

## 2018-01-17 ENCOUNTER — Other Ambulatory Visit (HOSPITAL_COMMUNITY): Payer: BLUE CROSS/BLUE SHIELD

## 2018-01-19 ENCOUNTER — Ambulatory Visit (HOSPITAL_COMMUNITY): Payer: BLUE CROSS/BLUE SHIELD

## 2018-01-22 ENCOUNTER — Ambulatory Visit (HOSPITAL_COMMUNITY): Payer: BLUE CROSS/BLUE SHIELD

## 2018-01-24 ENCOUNTER — Ambulatory Visit (HOSPITAL_COMMUNITY): Payer: BLUE CROSS/BLUE SHIELD | Attending: Cardiology

## 2018-01-24 ENCOUNTER — Ambulatory Visit (HOSPITAL_COMMUNITY): Payer: BLUE CROSS/BLUE SHIELD

## 2018-01-24 ENCOUNTER — Other Ambulatory Visit: Payer: Self-pay

## 2018-01-24 VITALS — BP 120/41

## 2018-01-24 DIAGNOSIS — I5181 Takotsubo syndrome: Secondary | ICD-10-CM | POA: Insufficient documentation

## 2018-01-24 DIAGNOSIS — E785 Hyperlipidemia, unspecified: Secondary | ICD-10-CM | POA: Diagnosis not present

## 2018-01-24 DIAGNOSIS — I358 Other nonrheumatic aortic valve disorders: Secondary | ICD-10-CM | POA: Insufficient documentation

## 2018-01-24 DIAGNOSIS — I739 Peripheral vascular disease, unspecified: Secondary | ICD-10-CM | POA: Diagnosis not present

## 2018-01-24 LAB — ECHOCARDIOGRAM COMPLETE

## 2018-01-24 MED ORDER — PERFLUTREN LIPID MICROSPHERE
1.0000 mL | INTRAVENOUS | Status: AC | PRN
  Administered 2018-01-24: 1 mL via INTRAVENOUS

## 2018-01-25 ENCOUNTER — Inpatient Hospital Stay: Payer: BLUE CROSS/BLUE SHIELD | Admitting: Family Medicine

## 2018-01-26 ENCOUNTER — Ambulatory Visit (HOSPITAL_COMMUNITY): Payer: BLUE CROSS/BLUE SHIELD

## 2018-01-26 ENCOUNTER — Telehealth: Payer: Self-pay | Admitting: Internal Medicine

## 2018-01-26 NOTE — Telephone Encounter (Signed)
No. I recommend that Brilinta be continued or tooth extraction delayed if at all possible. If tooth must be extracted to prevent a serious dental complication and extraction cannot be performed on DAPT, then ticagrelor should be held for 5 days before the procedure and restarted as soon as possible afterwards. However, the risk for cardiac complications is increased if DAPT is discontinued, as she is less than 6 months out from her NSTEMI and DES placement to the RCA.  Nelva Bush, MD [facility] Pager: [phone]

## 2018-01-26 NOTE — Telephone Encounter (Signed)
Is it ok for patient to hold Brilinta for 3 days prior to tooth extraction?  Thank you

## 2018-01-26 NOTE — Telephone Encounter (Signed)
Spoke with the patient and gave her the two options recommended by Dr. Saunders Revel..  She will contact her dentist and check on these options..  She verbalized understanding.Marland Kitchen

## 2018-01-26 NOTE — Telephone Encounter (Signed)
Mrs. Nabi is calling to see if she can go off of her Brilinta for 3 days because she has to have a tooth extraction . Please call

## 2018-01-29 ENCOUNTER — Ambulatory Visit (HOSPITAL_COMMUNITY): Payer: BLUE CROSS/BLUE SHIELD

## 2018-01-31 ENCOUNTER — Ambulatory Visit (HOSPITAL_COMMUNITY): Payer: BLUE CROSS/BLUE SHIELD

## 2018-02-01 ENCOUNTER — Other Ambulatory Visit: Payer: Self-pay | Admitting: *Deleted

## 2018-02-01 MED ORDER — DULOXETINE HCL 20 MG PO CPEP: 20.0000 mg | ORAL_CAPSULE | Freq: Every day | ORAL | 3 refills | Status: DC

## 2018-02-02 ENCOUNTER — Ambulatory Visit (HOSPITAL_COMMUNITY): Payer: BLUE CROSS/BLUE SHIELD

## 2018-02-05 ENCOUNTER — Ambulatory Visit (HOSPITAL_COMMUNITY): Payer: BLUE CROSS/BLUE SHIELD

## 2018-02-07 ENCOUNTER — Ambulatory Visit (HOSPITAL_COMMUNITY): Payer: BLUE CROSS/BLUE SHIELD

## 2018-02-09 ENCOUNTER — Ambulatory Visit (HOSPITAL_COMMUNITY): Payer: BLUE CROSS/BLUE SHIELD

## 2018-02-12 ENCOUNTER — Ambulatory Visit (HOSPITAL_COMMUNITY): Payer: BLUE CROSS/BLUE SHIELD

## 2018-02-14 ENCOUNTER — Ambulatory Visit (HOSPITAL_COMMUNITY): Payer: BLUE CROSS/BLUE SHIELD

## 2018-02-16 ENCOUNTER — Ambulatory Visit (HOSPITAL_COMMUNITY): Payer: BLUE CROSS/BLUE SHIELD

## 2018-02-16 ENCOUNTER — Other Ambulatory Visit: Payer: BLUE CROSS/BLUE SHIELD

## 2018-02-19 ENCOUNTER — Ambulatory Visit (HOSPITAL_COMMUNITY): Payer: BLUE CROSS/BLUE SHIELD

## 2018-02-20 ENCOUNTER — Ambulatory Visit: Payer: BLUE CROSS/BLUE SHIELD | Admitting: Physician Assistant

## 2018-02-21 ENCOUNTER — Other Ambulatory Visit: Payer: BLUE CROSS/BLUE SHIELD

## 2018-02-21 ENCOUNTER — Ambulatory Visit (HOSPITAL_COMMUNITY): Payer: BLUE CROSS/BLUE SHIELD

## 2018-02-23 ENCOUNTER — Other Ambulatory Visit: Payer: Self-pay | Admitting: *Deleted

## 2018-02-23 ENCOUNTER — Ambulatory Visit (HOSPITAL_COMMUNITY): Payer: BLUE CROSS/BLUE SHIELD

## 2018-02-23 MED ORDER — DULOXETINE HCL 20 MG PO CPEP: 20.0000 mg | ORAL_CAPSULE | Freq: Every day | ORAL | 3 refills | Status: DC

## 2018-02-23 NOTE — Telephone Encounter (Signed)
Rx done. 

## 2018-02-26 ENCOUNTER — Other Ambulatory Visit: Payer: Self-pay | Admitting: *Deleted

## 2018-02-26 ENCOUNTER — Ambulatory Visit (HOSPITAL_COMMUNITY): Payer: BLUE CROSS/BLUE SHIELD

## 2018-02-26 MED ORDER — DULOXETINE HCL 20 MG PO CPEP: 20.0000 mg | ORAL_CAPSULE | Freq: Every day | ORAL | 1 refills | Status: DC

## 2018-02-26 NOTE — Telephone Encounter (Signed)
Rx done. 

## 2018-02-28 ENCOUNTER — Ambulatory Visit (HOSPITAL_COMMUNITY): Payer: BLUE CROSS/BLUE SHIELD

## 2018-03-02 ENCOUNTER — Ambulatory Visit (HOSPITAL_COMMUNITY): Payer: BLUE CROSS/BLUE SHIELD

## 2018-03-05 ENCOUNTER — Ambulatory Visit (HOSPITAL_COMMUNITY): Payer: BLUE CROSS/BLUE SHIELD

## 2018-03-07 ENCOUNTER — Ambulatory Visit (HOSPITAL_COMMUNITY): Payer: BLUE CROSS/BLUE SHIELD

## 2018-03-09 ENCOUNTER — Ambulatory Visit (HOSPITAL_COMMUNITY): Payer: BLUE CROSS/BLUE SHIELD

## 2018-03-11 ENCOUNTER — Other Ambulatory Visit: Payer: Self-pay | Admitting: Family Medicine

## 2018-03-12 ENCOUNTER — Ambulatory Visit (HOSPITAL_COMMUNITY): Payer: BLUE CROSS/BLUE SHIELD

## 2018-03-14 ENCOUNTER — Ambulatory Visit (HOSPITAL_COMMUNITY): Payer: BLUE CROSS/BLUE SHIELD

## 2018-03-14 ENCOUNTER — Other Ambulatory Visit: Payer: BLUE CROSS/BLUE SHIELD

## 2018-03-16 ENCOUNTER — Ambulatory Visit (HOSPITAL_COMMUNITY): Payer: BLUE CROSS/BLUE SHIELD

## 2018-03-23 ENCOUNTER — Ambulatory Visit: Payer: BLUE CROSS/BLUE SHIELD | Admitting: Physician Assistant

## 2018-04-03 ENCOUNTER — Other Ambulatory Visit: Payer: BLUE CROSS/BLUE SHIELD

## 2018-04-03 DIAGNOSIS — I2510 Atherosclerotic heart disease of native coronary artery without angina pectoris: Secondary | ICD-10-CM

## 2018-04-03 DIAGNOSIS — E785 Hyperlipidemia, unspecified: Secondary | ICD-10-CM

## 2018-04-04 LAB — ALT: ALT: 8 IU/L (ref 0–32)

## 2018-04-04 LAB — LIPID PANEL
CHOL/HDL RATIO: 3.1 ratio (ref 0.0–4.4)
Cholesterol, Total: 113 mg/dL (ref 100–199)
HDL: 36 mg/dL — ABNORMAL LOW (ref >39)
LDL CALC: 49 mg/dL (ref 0–99)
Triglycerides: 140 mg/dL (ref 0–149)
VLDL Cholesterol Cal: 28 mg/dL (ref 5–40)

## 2018-04-10 ENCOUNTER — Encounter

## 2018-04-10 ENCOUNTER — Ambulatory Visit: Payer: BLUE CROSS/BLUE SHIELD | Admitting: Physician Assistant

## 2018-04-10 ENCOUNTER — Encounter: Payer: Self-pay | Admitting: Physician Assistant

## 2018-04-10 VITALS — BP 118/68 | HR 76 | Ht 63.0 in | Wt 96.8 lb

## 2018-04-10 DIAGNOSIS — R1013 Epigastric pain: Secondary | ICD-10-CM | POA: Diagnosis not present

## 2018-04-10 DIAGNOSIS — I2510 Atherosclerotic heart disease of native coronary artery without angina pectoris: Secondary | ICD-10-CM

## 2018-04-10 DIAGNOSIS — E785 Hyperlipidemia, unspecified: Secondary | ICD-10-CM

## 2018-04-10 DIAGNOSIS — I739 Peripheral vascular disease, unspecified: Secondary | ICD-10-CM

## 2018-04-10 DIAGNOSIS — R0602 Shortness of breath: Secondary | ICD-10-CM | POA: Diagnosis not present

## 2018-04-10 DIAGNOSIS — R5383 Other fatigue: Secondary | ICD-10-CM | POA: Diagnosis not present

## 2018-04-10 DIAGNOSIS — I5022 Chronic systolic (congestive) heart failure: Secondary | ICD-10-CM

## 2018-04-10 NOTE — Patient Instructions (Signed)
Medication Instructions:  Your physician recommends that you continue on your current medications as directed. Please refer to the Current Medication list given to you today.   Labwork: Today: BMET and CBC  Testing/Procedures: None ordered  Follow-Up: Your physician wants you to follow-up in: 6 MONTHS with Dr. END. You will receive a reminder letter in the mail two months in advance. If you don't receive a letter, please call our office to schedule the follow-up appointment.   Any Other Special Instructions Will Be Listed Below (If Applicable).  1. Left message with Dr. Luther Parody nurse, Jacqlyn Larsen to call you for a sooner appointment.  2. Per Richardson Dopp, PAC, contact Dr. Lynne Leader office for an appointment.    If you need a refill on your cardiac medications before your next appointment, please call your pharmacy.

## 2018-04-10 NOTE — Progress Notes (Signed)
Cardiology Office Note:    Date:  04/10/2018   ID:  Erin [name], DOB 1956/06/13, MRN [id number]  PCP:  Lucretia Kern, DO  Cardiologist:  Nelva Bush, MD   Referring MD: Lucretia Kern, DO   Chief Complaint  Patient presents with  . Follow-up    CAD, CHF    History of Present Illness:    Erin [name] is a 62 y.o. female with coronary artery disease status post DES to the mid RCA in November 2018, mixed ischemic and nonischemic cardiomyopathy, peripheral arterial disease status post aortobifem and bilateral femoral to popliteal bypasses in September 2018, chronic hypokalemia, anal carcinoma.  Echocardiogram in December 2018 demonstrated reduced LV function with an EF of 35-40%.  She was last seen by Dr. Saunders Revel in January 2019.  It was felt that her cardiomyopathy was more suggestive of a stress-induced cardiomyopathy.  The patient was unable to tolerate low-dose ACE inhibitor.  Follow-up echocardiogram was arranged in March 2019.  This demonstrated improved LV function with an EF of 55-60%.  [name] returns for follow-up.  She is here today with her husband.  She has recently noticed bilateral lower extremity pain with ambulation similar to the pain she had prior to her lower extremity bypass.  She also notes abdominal pain that typically occurs after eating.  She also notes soreness of her abdominal incision site.  She notes associated nausea and has a poor appetite.  She denies any melena or hematochezia.  She denies hematemesis.  She does describe significant fatigue.  She denies chest discomfort.  She does note shortness of breath with most activity.  This is a chronic symptom without change.  She denies orthopnea, PND, edema.  She denies syncope.  Prior CV studies:   The following studies were reviewed today:  Echo 01/24/18 EF 55-60, no RWMA, normal diastolic function, trivial MR/TR  Echo 10/23/2017 EF 35-40  Cardiac catheterization 10/13/2017 LAD mid 40 LCx  normal RCA proximal 30, mid 99, 30, distal 50 EF 40-45 PCI: 2 x 12 mm Resolute Onyx DES to the mid RCA  Nuclear stress test 08/09/2017 EF 44, normal perfusion, low risk  Echo 06/08/2016 EF 55-60, normal wall motion, grade 1 diastolic dysfunction  Past Medical History:  Diagnosis Date  . Allergy   . Alopecia   . 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 Anxiety   . B12 deficiency anemia 09/14/2015  . Blood transfusion without reported diagnosis   . CAD (coronary artery disease), native coronary artery 10/13/2017   DES to mid RCA  . Chronic back pain   . Chronic daily headache 03/29/2013   takes bc powder  . Closed right hip fracture ([place]) 09/10/2015  . Depression   . DM (diabetes mellitus) ([place])    diet controlled; does not check BG  . GERD (gastroesophageal reflux disease)   . History of hiatal hernia   . HLD (hyperlipidemia)   . Hot flashes   . Hyperlipemia 04/11/2014  . Hypokalemia 05/2016  . Hypothyroidism 09/10/2015  . IBS (irritable bowel syndrome) 03/29/2013  . 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 QT prolongation   . Tubular adenoma of colon 09/08/2003  . Vertigo   . Wears glasses    Surgical Hx: The patient  has a past surgical history that includes Ectopic pregnancy surgery; Pilonidal cyst excision; Colonoscopy; Flexible sigmoidoscopy (N/A, 08/14/2013); Polypectomy; Tonsillectomy; Total hip arthroplasty (09/11/2015); Lower Extremity Angiography (  Bilateral, 08/02/2017); ABDOMINAL AORTOGRAM (N/A, 08/02/2017); Multiple tooth extractions; dental implant; Embolectomy (N/A, 08/14/2017); Femoral-popliteal Bypass Graft (Right, 08/14/2017); Aorta - bilateral femoral artery bypass graft (N/A, 08/14/2017); Thrombectomy femoral artery (Right, 08/14/2017); LEFT HEART CATH AND CORONARY ANGIOGRAPHY (N/A, 10/13/2017); Ultrasound guidance for vascular access (10/13/2017); and CORONARY STENT INTERVENTION (N/A, 10/13/2017).    Current Medications: Current Meds  Medication Sig  . acetaminophen (TYLENOL) 500 MG tablet Take 500 mg every 6 (six) hours as needed by mouth for headache (pain).  Marland Kitchen acyclovir ointment (ZOVIRAX) 5 % Apply 1 application topically every 3 (three) hours as needed (fever blisters).  Marland Kitchen aspirin EC 81 MG tablet Take 81 mg by mouth daily.  . Biotin 10000 MCG TABS Take 10,000 mcg by mouth daily.  Marland Kitchen dicyclomine (BENTYL) 10 MG capsule TAKE ONE CAPSULE BY MOUTH THREE TIMES DAILY BEFORE MEALS. DUE FOR FOLLOW UP IN MAY, NEEDS TO SCHEDULE AN APPOINTMENT  . DULoxetine (CYMBALTA) 20 MG capsule Take 1 capsule (20 mg total) by mouth daily.  . fluticasone (FLONASE) 50 MCG/ACT nasal spray USE 1 SPRAY IN EACH NOSTRIL DAILY  . gabapentin (NEURONTIN) 600 MG tablet TAKE 1 TABLET BY MOUTH THREE TIMES DAILY GENERIC EQUIVALENT FOR NEURONTIN  . levothyroxine (SYNTHROID, LEVOTHROID) 75 MCG tablet TAKE 1 TABLET BY MOUTH DAILY. GENERIC EQUIVALENT FOR SYNTHROID  . linaclotide (LINZESS) 145 MCG CAPS capsule Take 145 mcg by mouth as directed.  . meclizine (ANTIVERT) 12.5 MG tablet TAKE 1 TABLET (12.5 MG TOTAL) BY MOUTH 3 (THREE) TIMES DAILY AS NEEDED FOR DIZZINESS.  . metoprolol succinate (TOPROL-XL) 25 MG 24 hr tablet Take 1 tablet (25 mg total) by mouth 2 (two) times daily.  Marland Kitchen nystatin-triamcinolone (MYCOLOG II) cream APPLY TO CORNERS OF THE MOUTH TWICE A DAY AS NEEDED FOR CRACKING/BURNING  . omeprazole (PRILOSEC) 40 MG capsule TAKE ONE CAPSULE BY MOUTH DAILY. NEED APPOINTMENT FOR FURTHER REFILLS  . potassium chloride SA (K-DUR,KLOR-CON) 20 MEQ tablet TAKE 1 TABLET BY MOUTH DAILY  . rosuvastatin (CRESTOR) 40 MG tablet Take 1 tablet (40 mg total) by mouth daily.  . ticagrelor (BRILINTA) 90 MG TABS tablet Take 1 tablet (90 mg total) by mouth 2 (two) times daily.  . traMADol (ULTRAM) 50 MG tablet TAKE 1 TABLET BY MOUTH EVERY 6 HOURS AS NEEDED  . vitamin B-12 1000 MCG tablet Take 1 tablet (1,000 mcg total) by mouth daily.      Allergies:   Amitiza [lubiprostone]; Nortriptyline; Sulfa antibiotics; Tramadol; Atorvastatin; Claritin [loratadine]; Dexamethasone; and Prednisone   Social History   Tobacco Use  . Smoking status: Former Smoker    Packs/day: 0.50    Types: Cigarettes    Last attempt to quit: 10/07/2014    Years since quitting: 3.5  . Smokeless tobacco: Never Used  Substance Use Topics  . Alcohol use: No    Alcohol/week: 0.0 oz  . Drug use: No     Family Hx: The patient's family history includes Arthritis in her mother; Heart attack in her father, maternal grandfather, maternal uncle, and mother; Heart disease in her father and mother; Hyperlipidemia in her father and mother; Hypertension in her father and mother; Irritable bowel syndrome in her mother; Lung cancer in her brother; Prostate cancer in her father; Stroke (age of onset: 48) in her mother; Stroke (age of onset: 75) in her father; Thyroid disease in her father and mother. There is no history of Colon cancer, Rectal cancer, or Stomach cancer.  ROS:   Please see the history of present illness.  Review of Systems  Constitution: Positive for malaise/fatigue.  Hematologic/Lymphatic: Bruises/bleeds easily.  Musculoskeletal: Positive for back pain and joint pain.  Gastrointestinal: Positive for nausea.  Neurological: Positive for dizziness and headaches.   All other systems reviewed and are negative.   EKGs/Labs/Other Test Reviewed:    EKG:  EKG is  ordered today.  The ekg ordered today demonstrates sinus bradycardia, heart rate 56, normal axis, QTC 414 ms, similar to prior tracing dated 11/02/17  Recent Labs: 10/14/2017: Magnesium 1.5 01/09/2018: BUN 17; Creatinine, Ser 0.71; Hemoglobin 12.0; Platelets 318.0; Potassium 3.9; Sodium 140; TSH 0.49 04/03/2018: ALT 8   Recent Lipid Panel Lab Results  Component Value Date/Time   CHOL 113 04/03/2018 04:03 PM   TRIG 140 04/03/2018 04:03 PM   HDL 36 (L) 04/03/2018 04:03 PM   CHOLHDL 3.1  04/03/2018 04:03 PM   CHOLHDL 9 09/26/2016 11:50 AM   LDLCALC 49 04/03/2018 04:03 PM   LDLDIRECT 214.0 09/26/2016 11:50 AM    Physical Exam:    VS:  BP 118/68   Pulse 76   Ht 5\' 3"  (1.6 m)   Wt 96 lb 12 oz (43.9 kg)   SpO2 98%   BMI 17.14 kg/m     Wt Readings from Last 3 Encounters:  04/10/18 96 lb 12 oz (43.9 kg)  01/09/18 99 lb 4.8 oz (45 kg)  12/05/17 97 lb 8 oz (44.2 kg)     Physical Exam  Constitutional: She is oriented to person, place, and time. She appears well-developed and well-nourished. No distress.  HENT:  Head: Normocephalic and atraumatic.  Neck: Neck supple. No JVD present.  Cardiovascular: Normal rate, regular rhythm, S1 normal and S2 normal. Exam reveals distant heart sounds.  No murmur heard. 2+ bilateral femoral artery pulses  Pulmonary/Chest: Effort normal. She has no wheezes. She has no rales.  Abdominal: Soft. She exhibits no pulsatile midline mass. There is no hepatomegaly. There is tenderness in the epigastric area.  Musculoskeletal: She exhibits no edema.  Neurological: She is alert and oriented to person, place, and time.  Skin: Skin is warm and dry.    ASSESSMENT & PLAN:    Coronary artery disease involving native coronary artery of native heart without angina pectoris Status post DES to the mid RCA in November 2018.  She denies anginal symptoms.  Continue aspirin, Ticagrelor, rosuvastatin, beta-blocker.  Chronic systolic heart failure (HCC) Recent echocardiogram demonstrated improved LV function with normal ejection fraction.  It is felt that her cardiomyopathy was more stress-induced.  She was unable to tolerate ACE inhibitor.  Continue low-dose beta-blocker.  Volume status is stable.  She is NYHA 2 b-3.  Dyspnea is multifactorial.  Peripheral vascular disease ([place]) She notes recurrent symptoms of claudication bilaterally.  She tells me that these are the same symptoms she had prior to her bypass.  She does not see Dr. Donnetta Hutching for several  weeks.  We will contact his office for earlier follow-up.  Continue aspirin, statin.  Hyperlipidemia LDL goal <70 LDL optimal on most recent lab work.  Continue current Rx.    Epigastric pain Epigastric pain mainly occurs with eating.  Question if she is describing mesenteric ischemia.  Other possibilities include peptic ulcer disease, incisional pain from lower extremity bypass.  I have asked her to try to take omeprazole twice a day for couple of weeks.  I have also asked her to get back into see Dr. Fuller Plan, her gastroenterologist.  She may need mesenteric artery ultrasound.  I will leave this  up to Dr. Donnetta Hutching and Dr. Fuller Plan.  Fatigue, unspecified type She notes significant fatigue.  Since she does have epigastric pain and is on aspirin and Ticagrelor, I will obtain a BMET, CBC today.  Shortness of breath Her shortness of breath is chronic.  It seems to be multifactorial.  She has a significant prior smoking history.  I suspect her shortness of breath is related to deconditioning as well as underlying COPD.  Her shortness of breath predates the initiation of Ticagrelor.  Therefore, I do not believe she is having a side effect to this medication.   Dispo:  Return in about 6 months (around 10/11/2018) for Routine Follow Up, w/ Dr. Saunders Revel.   Medication Adjustments/Labs and Tests Ordered: Current medicines are reviewed at length with the patient today.  Concerns regarding medicines are outlined above.  Tests Ordered: Orders Placed This Encounter  Procedures  . Basic metabolic panel  . CBC  . EKG 12-Lead   Medication Changes: No orders of the defined types were placed in this encounter.   Signed, Richardson Dopp, PA-C  04/10/2018 11:24 AM    [facility] [place], [place], [place]  [address] Phone: [phone]; Fax: [phone]

## 2018-04-11 ENCOUNTER — Telehealth: Payer: Self-pay | Admitting: *Deleted

## 2018-04-11 LAB — CBC
Hematocrit: 38.4 % (ref 34.0–46.6)
Hemoglobin: 12.3 g/dL (ref 11.1–15.9)
MCH: 30.6 pg (ref 26.6–33.0)
MCHC: 32 g/dL (ref 31.5–35.7)
MCV: 96 fL (ref 79–97)
PLATELETS: 267 10*3/uL (ref 150–450)
RBC: 4.02 x10E6/uL (ref 3.77–5.28)
RDW: 13.2 % (ref 12.3–15.4)
WBC: 9.3 10*3/uL (ref 3.4–10.8)

## 2018-04-11 LAB — BASIC METABOLIC PANEL
BUN/Creatinine Ratio: 18 (ref 12–28)
BUN: 14 mg/dL (ref 8–27)
CALCIUM: 9.9 mg/dL (ref 8.7–10.3)
CO2: 24 mmol/L (ref 20–29)
Chloride: 102 mmol/L (ref 96–106)
Creatinine, Ser: 0.79 mg/dL (ref 0.57–1.00)
GFR calc Af Amer: 93 mL/min/{1.73_m2} (ref >59)
GFR, EST NON AFRICAN AMERICAN: 81 mL/min/{1.73_m2} (ref >59)
Glucose: 91 mg/dL (ref 65–99)
Potassium: 4.3 mmol/L (ref 3.5–5.2)
Sodium: 140 mmol/L (ref 134–144)

## 2018-04-11 NOTE — Telephone Encounter (Signed)
-----   Message from Liliane Shi, [place] sent at 04/11/2018  1:36 PM EDT ----- Renal function, potassium, hemoglobin normal. PLAN:  Continue current medications and follow up as planned.  Richardson Dopp, PA-C    04/11/2018 1:35 PM

## 2018-04-11 NOTE — Telephone Encounter (Signed)
DPR on file for pt's husband who has been notified of lab results by phone with verbal understanding. Pt's husband thanked me for the call. He did ask if the results were sent to [facility], I answered yes results have been released to [facility].

## 2018-04-12 ENCOUNTER — Ambulatory Visit: Payer: BLUE CROSS/BLUE SHIELD | Admitting: Family Medicine

## 2018-04-12 ENCOUNTER — Encounter: Payer: Self-pay | Admitting: Family Medicine

## 2018-04-12 VITALS — BP 100/60 | HR 71 | Temp 98.4°F | Ht 63.0 in | Wt 97.1 lb

## 2018-04-12 DIAGNOSIS — E119 Type 2 diabetes mellitus without complications: Secondary | ICD-10-CM

## 2018-04-12 DIAGNOSIS — E785 Hyperlipidemia, unspecified: Secondary | ICD-10-CM | POA: Diagnosis not present

## 2018-04-12 DIAGNOSIS — I739 Peripheral vascular disease, unspecified: Secondary | ICD-10-CM | POA: Diagnosis not present

## 2018-04-12 DIAGNOSIS — I5181 Takotsubo syndrome: Secondary | ICD-10-CM

## 2018-04-12 DIAGNOSIS — E1169 Type 2 diabetes mellitus with other specified complication: Secondary | ICD-10-CM | POA: Diagnosis not present

## 2018-04-12 DIAGNOSIS — F325 Major depressive disorder, single episode, in full remission: Secondary | ICD-10-CM | POA: Diagnosis not present

## 2018-04-12 DIAGNOSIS — E876 Hypokalemia: Secondary | ICD-10-CM | POA: Diagnosis not present

## 2018-04-12 DIAGNOSIS — D519 Vitamin B12 deficiency anemia, unspecified: Secondary | ICD-10-CM

## 2018-04-12 LAB — POCT GLYCOSYLATED HEMOGLOBIN (HGB A1C): Hemoglobin A1C: 5.2 % (ref 4.0–5.6)

## 2018-04-12 NOTE — Progress Notes (Signed)
HPI:  Using dictation device. Unfortunately this device frequently misinterprets words/phrases.  Erin [name] is a pleasant 62 y.o. here for follow up. Chronic medical problems summarized below were reviewed for changes. reports ok, but will be seeing GI for some epigastric pain and will be seeing her vascular doc for some numbness in the legs. Did some labs elsewhere recently, but did not do hgba1c. Denies mood changes, CP, SOB, DOE, treatment intolerance or new symptoms. Due for mammogram and eye exam  Hyperglycemia/borderline: -dx in 2014, well controlled -no meds  Hypothyroidism: -meds: levothyroxine  IBS/GERD: -sees GI  Hx of anal cancer: -managed by GI and oncology  Hx CAD, cardiomyopathy, peripheral vascular disease: -sees cardiology and vasc surgery -s/p pci November 2018, s/p aortobifem and bilat fem to pop bypass 07/2017 -meds:asa, bb, brilinta, statin  Hypokalemia: -hx of -takes potassium -did not tol acei/arb   Depression/underweight/neuropathy/pain: -long hx neuropathy, sees neurology -had longstanding pain after vascular surgery -cymbalta helped all of the above -takes b12 for neuropathy, b12 def  ROS: See pertinent positives and negatives per HPI.  Past Medical History:  Diagnosis Date  . Allergy   . Alopecia   . 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 Anxiety   . B12 deficiency anemia 09/14/2015  . Blood transfusion without reported diagnosis   . CAD (coronary artery disease), native coronary artery 10/13/2017   DES to mid RCA  . Chronic back pain   . Chronic daily headache 03/29/2013   takes bc powder  . Closed right hip fracture ([place]) 09/10/2015  . Depression   . DM (diabetes mellitus) ([place])    diet controlled; does not check BG  . GERD (gastroesophageal reflux disease)   . History of hiatal hernia   . HLD (hyperlipidemia)   . Hot flashes   . Hyperlipemia 04/11/2014   . Hypokalemia 05/2016  . Hypothyroidism 09/10/2015  . IBS (irritable bowel syndrome) 03/29/2013  . 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 QT prolongation   . Tubular adenoma of colon 09/08/2003  . Vertigo   . Wears glasses     Past Surgical History:  Procedure Laterality Date  . ABDOMINAL AORTOGRAM N/A 08/02/2017   Procedure: ABDOMINAL AORTOGRAM;  Surgeon: Wellington Hampshire, MD;  Location: [facility];  Service: Cardiovascular;  Laterality: N/A;  . AORTA - BILATERAL FEMORAL ARTERY BYPASS GRAFT N/A 08/14/2017   Procedure: AORTA BIFEMORAL BYPASS GRAFT;  Surgeon: Rosetta Posner, MD;  Location: [facility];  Service: Vascular;  Laterality: N/A;  . COLONOSCOPY    . CORONARY STENT INTERVENTION N/A 10/13/2017   Procedure: CORONARY STENT INTERVENTION;  Surgeon: Nelva Bush, MD;  Location: [facility];  Service: Cardiovascular;  Laterality: N/A;  . dental implant    . ECTOPIC PREGNANCY SURGERY    . EMBOLECTOMY N/A 08/14/2017   Procedure: EMBOLECTOMY FEMORAL;  Surgeon: Rosetta Posner, MD;  Location: [facility];  Service: Vascular;  Laterality: N/A;  . FEMORAL-POPLITEAL BYPASS GRAFT Right 08/14/2017   Procedure: Right Femoral to Above Knee Popliteal Bypass Graft using Non-Reversed Greater Saphenous Vein Graft from Right Leg;  Surgeon: Rosetta Posner, MD;  Location: [facility];  Service: Vascular;  Laterality: Right;  . FLEXIBLE SIGMOIDOSCOPY N/A 08/14/2013   Procedure: FLEXIBLE SIGMOIDOSCOPY;  Surgeon: Ladene Artist, MD;  Location: [facility];  Service: Endoscopy;  Laterality: N/A;  . LEFT HEART CATH AND CORONARY ANGIOGRAPHY N/A 10/13/2017   Procedure: LEFT  HEART CATH AND CORONARY ANGIOGRAPHY;  Surgeon: Nelva Bush, MD;  Location: [facility];  Service: Cardiovascular;  Laterality: N/A;  . LOWER EXTREMITY ANGIOGRAPHY Bilateral 08/02/2017   Procedure: Lower Extremity Angiography;  Surgeon: Wellington Hampshire, MD;  Location: [facility];  Service: Cardiovascular;  Laterality:  Bilateral;  . MULTIPLE TOOTH EXTRACTIONS    . PILONIDAL CYST EXCISION    . POLYPECTOMY    . THROMBECTOMY FEMORAL ARTERY Right 08/14/2017   Procedure: THROMBECTOMY FEMORAL ARTERY;  Surgeon: Rosetta Posner, MD;  Location: [facility];  Service: Vascular;  Laterality: Right;  . TONSILLECTOMY    . TOTAL HIP ARTHROPLASTY  09/11/2015   Procedure: TOTAL HIP ARTHROPLASTY;  Surgeon: Renette Butters, MD;  Location: [facility];  Service: Orthopedics;;  . ULTRASOUND GUIDANCE FOR VASCULAR ACCESS  10/13/2017   Procedure: Ultrasound Guidance For Vascular Access;  Surgeon: Nelva Bush, MD;  Location: [facility];  Service: Cardiovascular;;    Family History  Problem Relation Age of Onset  . Arthritis Mother   . Hyperlipidemia Mother   . Heart disease Mother   . Hypertension Mother   . Stroke Mother 3  . Irritable bowel syndrome Mother   . Thyroid disease Mother   . Heart attack Mother   . Heart disease Father   . Hyperlipidemia Father   . Hypertension Father   . Stroke Father 51  . Thyroid disease Father   . Prostate cancer Father   . Heart attack Father   . Lung cancer Brother   . Heart attack Maternal Grandfather   . Heart attack Maternal Uncle   . Colon cancer Neg Hx   . Rectal cancer Neg Hx   . Stomach cancer Neg Hx     SOCIAL HX: see hpi   Current Outpatient Medications:  .  acetaminophen (TYLENOL) 500 MG tablet, Take 500 mg every 6 (six) hours as needed by mouth for headache (pain)., Disp: , Rfl:  .  acyclovir ointment (ZOVIRAX) 5 %, Apply 1 application topically every 3 (three) hours as needed (fever blisters)., Disp: , Rfl:  .  aspirin EC 81 MG tablet, Take 81 mg by mouth daily., Disp: , Rfl:  .  Biotin 10000 MCG TABS, Take 10,000 mcg by mouth daily., Disp: , Rfl:  .  dicyclomine (BENTYL) 10 MG capsule, TAKE ONE CAPSULE BY MOUTH THREE TIMES DAILY BEFORE MEALS. DUE FOR FOLLOW UP IN MAY, NEEDS TO SCHEDULE AN APPOINTMENT, Disp: 270 capsule, Rfl: 1 .  DULoxetine (CYMBALTA) 20 MG  capsule, Take 1 capsule (20 mg total) by mouth daily., Disp: 90 capsule, Rfl: 1 .  fluticasone (FLONASE) 50 MCG/ACT nasal spray, USE 1 SPRAY IN EACH NOSTRIL DAILY, Disp: 48 g, Rfl: 1 .  gabapentin (NEURONTIN) 600 MG tablet, TAKE 1 TABLET BY MOUTH THREE TIMES DAILY GENERIC EQUIVALENT FOR NEURONTIN, Disp: 270 tablet, Rfl: 0 .  levothyroxine (SYNTHROID, LEVOTHROID) 75 MCG tablet, TAKE 1 TABLET BY MOUTH DAILY. GENERIC EQUIVALENT FOR SYNTHROID, Disp: 90 tablet, Rfl: 0 .  linaclotide (LINZESS) 145 MCG CAPS capsule, Take 145 mcg by mouth as directed., Disp: , Rfl:  .  meclizine (ANTIVERT) 12.5 MG tablet, TAKE 1 TABLET (12.5 MG TOTAL) BY MOUTH 3 (THREE) TIMES DAILY AS NEEDED FOR DIZZINESS., Disp: 30 tablet, Rfl: 0 .  metoprolol succinate (TOPROL-XL) 25 MG 24 hr tablet, Take 1 tablet (25 mg total) by mouth 2 (two) times daily., Disp: 180 tablet, Rfl: 3 .  nystatin-triamcinolone (MYCOLOG II) cream, APPLY TO CORNERS OF  THE MOUTH TWICE A DAY AS NEEDED FOR CRACKING/BURNING, Disp: , Rfl: 2 .  omeprazole (PRILOSEC) 40 MG capsule, TAKE ONE CAPSULE BY MOUTH DAILY. NEED APPOINTMENT FOR FURTHER REFILLS, Disp: 90 capsule, Rfl: 1 .  potassium chloride SA (K-DUR,KLOR-CON) 20 MEQ tablet, TAKE 1 TABLET BY MOUTH DAILY, Disp: 90 tablet, Rfl: 1 .  ticagrelor (BRILINTA) 90 MG TABS tablet, Take 1 tablet (90 mg total) by mouth 2 (two) times daily., Disp: 180 tablet, Rfl: 3 .  vitamin B-12 1000 MCG tablet, Take 1 tablet (1,000 mcg total) by mouth daily., Disp: 30 tablet, Rfl: 0 .  nitroGLYCERIN (NITROSTAT) 0.4 MG SL tablet, Place 1 tablet (0.4 mg total) under the tongue every 5 (five) minutes as needed for chest pain., Disp: 100 tablet, Rfl: 1 .  rosuvastatin (CRESTOR) 40 MG tablet, Take 1 tablet (40 mg total) by mouth daily., Disp: 90 tablet, Rfl: 3  EXAM:  Vitals:   04/12/18 1322  BP: 100/60  Pulse: 71  Temp: 98.4 F (36.9 C)    Body mass index is 17.2 kg/m.  GENERAL: vitals reviewed and listed above, alert,  oriented, appears well hydrated and in no acute distress  HEENT: atraumatic, conjunttiva clear, no obvious abnormalities on inspection of external nose and ears  NECK: no obvious masses on inspection  LUNGS: clear to auscultation bilaterally, no wheezes, rales or rhonchi, good air movement  CV: HRRR, no peripheral edema  MS: moves all extremities without noticeable abnormality  PSYCH: pleasant and cooperative, no obvious depression or anxiety  ASSESSMENT AND PLAN:  Discussed the following assessment and plan:  Type 2 diabetes mellitus without complication, without long-term current use of insulin (HCC) - Plan: POC HgB A1c  Peripheral vascular disease ([place])  Stress-induced cardiomyopathy  Hyperlipidemia associated with type 2 diabetes mellitus (HCC)  Anemia due to vitamin B12 deficiency, unspecified B12 deficiency type  Hypokalemia  Major depressive disorder in full remission, unspecified whether recurrent ([place])  -check hgba1c as not done elsewhere, other labs reviewed, potassium good -she has follow up with vascular and neurology -also advised neuro follow up -follow up 3 months   Patient Instructions  BEFORE YOU LEAVE: -hgba1c -follow up: 3 months  Please see vascular and GI doctors as planned.  Please schedule evaluation with your neurologist as well about the leg numbness.   We recommend the following healthy lifestyle for LIFE: 1) Small portions. But, make sure to get regular (at least 3 per day), healthy meals and small healthy snacks if needed.  2) Eat a healthy clean diet.   TRY TO EAT: -at least 5-7 servings of low sugar, colorful, and nutrient rich vegetables per day (not corn, potatoes or bananas.) -berries are the best choice if you wish to eat fruit (only eat small amounts if trying to reduce weight)  -lean meets (fish, white meat of chicken or [place]) -vegan proteins for some meals - beans or tofu, whole grains, nuts and seeds -Replace bad fats  with good fats - good fats include: fish, nuts and seeds, canola oil, olive oil -small amounts of low fat or non fat dairy -small amounts of100 % whole grains - check the lables -drink plenty of water  AVOID: -SUGAR, sweets, anything with added sugar, corn syrup or sweeteners - must read labels as even foods advertised as "healthy" often are loaded with sugar -if you must have a sweetener, small amounts of stevia may be best -sweetened beverages and artificially sweetened beverages -simple starches (rice, bread, potatoes, pasta, chips, etc -  small amounts of 100% whole grains are ok) -red meat, pork, butter -fried foods, fast food, processed food, excessive dairy, eggs and coconut.  3)Get at least 150 minutes of sweaty aerobic exercise per week.  4)Reduce stress - consider counseling, meditation and relaxation to balance other aspects of your life.     Lucretia Kern, DO

## 2018-04-12 NOTE — Patient Instructions (Signed)
BEFORE YOU LEAVE: -hgba1c -follow up: 3 months  Please see vascular and GI doctors as planned.  Please schedule evaluation with your neurologist as well about the leg numbness.   We recommend the following healthy lifestyle for LIFE: 1) Small portions. But, make sure to get regular (at least 3 per day), healthy meals and small healthy snacks if needed.  2) Eat a healthy clean diet.   TRY TO EAT: -at least 5-7 servings of low sugar, colorful, and nutrient rich vegetables per day (not corn, potatoes or bananas.) -berries are the best choice if you wish to eat fruit (only eat small amounts if trying to reduce weight)  -lean meets (fish, white meat of chicken or [place]) -vegan proteins for some meals - beans or tofu, whole grains, nuts and seeds -Replace bad fats with good fats - good fats include: fish, nuts and seeds, canola oil, olive oil -small amounts of low fat or non fat dairy -small amounts of100 % whole grains - check the lables -drink plenty of water  AVOID: -SUGAR, sweets, anything with added sugar, corn syrup or sweeteners - must read labels as even foods advertised as "healthy" often are loaded with sugar -if you must have a sweetener, small amounts of stevia may be best -sweetened beverages and artificially sweetened beverages -simple starches (rice, bread, potatoes, pasta, chips, etc - small amounts of 100% whole grains are ok) -red meat, pork, butter -fried foods, fast food, processed food, excessive dairy, eggs and coconut.  3)Get at least 150 minutes of sweaty aerobic exercise per week.  4)Reduce stress - consider counseling, meditation and relaxation to balance other aspects of your life.

## 2018-04-13 ENCOUNTER — Encounter: Payer: Self-pay | Admitting: Gastroenterology

## 2018-04-13 ENCOUNTER — Ambulatory Visit: Payer: BLUE CROSS/BLUE SHIELD | Admitting: Gastroenterology

## 2018-04-13 VITALS — BP 98/62 | HR 76 | Ht 63.0 in | Wt 96.2 lb

## 2018-04-13 DIAGNOSIS — R1013 Epigastric pain: Secondary | ICD-10-CM | POA: Diagnosis not present

## 2018-04-13 DIAGNOSIS — K581 Irritable bowel syndrome with constipation: Secondary | ICD-10-CM

## 2018-04-13 DIAGNOSIS — Z85048 Personal history of other malignant neoplasm of rectum, rectosigmoid junction, and anus: Secondary | ICD-10-CM

## 2018-04-13 DIAGNOSIS — K219 Gastro-esophageal reflux disease without esophagitis: Secondary | ICD-10-CM

## 2018-04-13 NOTE — Patient Instructions (Addendum)
Call back if your abdominal pain returns.   Normal BMI (Body Mass Index- based on height and weight) is between 19 and 25. Your BMI today is Body mass index is 17.05 kg/m. Marland Kitchen Please consider follow up  regarding your BMI with your Primary Care Provider.  Thank you for choosing me and [facility].  Pricilla Riffle. Dagoberto Ligas., MD., [name]

## 2018-04-13 NOTE — Progress Notes (Signed)
    History of Present Illness: This is a 62 year old female here for the evaluation of epigastric pain, GERD and IBS-C.  She is accompanied by her husband.  She relates 2 weeks of persistent epigastric pain that she describes as stabbing and cramping. Foods exacerbating her symptoms.  About 2 to 3 days ago the pain completely resolved.  She feels well without any GI complaints today.  Current Medications, Allergies, Past Medical History, Past Surgical History, Family History and Social History were reviewed in [employer] record.  Physical Exam: General: Well developed, well nourished, no acute distress Head: Normocephalic and atraumatic Eyes:  sclerae anicteric, EOMI Ears: Normal auditory acuity Mouth: No deformity or lesions Neck: Supple, no masses or thyromegaly Lungs: Clear throughout to auscultation Heart: Regular rate and rhythm; no murmurs, rubs or bruits Abdomen: Soft, non tender and non distended. No masses, hepatosplenomegaly or hernias noted. Normal Bowel sounds Rectal: DRE by Dr. Benay Spice in January on chart and pt related pain following the DRE.  Will defer exam to colonoscopy this fall. Musculoskeletal: Symmetrical with no gross deformities  Skin: No lesions on visible extremities Pulses:  Normal pulses noted Extremities: No clubbing, cyanosis, edema or deformities noted Neurological: Alert oriented x 4, grossly nonfocal Cervical Nodes:  No significant cervical adenopathy Inguinal Nodes: No significant inguinal adenopathy Psychological:  Alert and cooperative. Normal mood and affect  Assessment and Recommendations:  1.  Epigastric pain, resolved.  Etiology unclear.  If symptoms return proceed with abdominal/pelvic CT angiogram.  2. IBS-C. Linzess 145 mg daily.  Dicyclomine 10 mg qid, ac & hs.   3. GERD. Continue standard antireflux measures and omeprazole 40 mg po qam.   4. History of anal cancer.  Colonoscopy due in September 2019.

## 2018-04-27 ENCOUNTER — Ambulatory Visit: Payer: BLUE CROSS/BLUE SHIELD | Admitting: Neurology

## 2018-04-27 ENCOUNTER — Encounter: Payer: Self-pay | Admitting: Neurology

## 2018-04-27 ENCOUNTER — Other Ambulatory Visit: Payer: Self-pay

## 2018-04-27 VITALS — BP 110/72 | HR 92 | Ht 63.0 in | Wt 97.0 lb

## 2018-04-27 DIAGNOSIS — R51 Headache: Secondary | ICD-10-CM

## 2018-04-27 DIAGNOSIS — H8110 Benign paroxysmal vertigo, unspecified ear: Secondary | ICD-10-CM | POA: Diagnosis not present

## 2018-04-27 DIAGNOSIS — R519 Headache, unspecified: Secondary | ICD-10-CM

## 2018-04-27 MED ORDER — MECLIZINE HCL 12.5 MG PO TABS: 12.5000 mg | ORAL_TABLET | Freq: Three times a day (TID) | ORAL | 6 refills | Status: DC | PRN

## 2018-04-27 MED ORDER — DULOXETINE HCL 20 MG PO CPEP: ORAL_CAPSULE | ORAL | 3 refills | Status: DC
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
## 2018-04-27 NOTE — Patient Instructions (Signed)
1. Increase Duloxetine 30mg : Take 2 capsules daily 2. Take meclizine 12.5mg  as needed for vertigo and nausea 3. Minimize Tylenol or any over the counter pain medication to only 2-3 times a week, otherwise headaches can worsen 4. Call our office when you would like proceed with Physical Therapy 5. Follow-up in 6 months, call for any changes

## 2018-05-01 ENCOUNTER — Telehealth: Payer: Self-pay | Admitting: Gastroenterology

## 2018-05-01 DIAGNOSIS — R1013 Epigastric pain: Secondary | ICD-10-CM

## 2018-05-01 NOTE — Telephone Encounter (Signed)
No answer/machine.  I will try and reach her at a later date

## 2018-05-01 NOTE — Telephone Encounter (Signed)
Pt called to inform that her sxs went away for a while but they came back. She stated that Dr. Fuller Plan told her that if sxs re-ocurred he could order some imaging test. Pls call her.

## 2018-05-02 NOTE — Telephone Encounter (Signed)
Patient reports that her pain has returned and she is having vomiting after meals.  Do you want to order CT angio?

## 2018-05-06 NOTE — Telephone Encounter (Signed)
Proceed with abd/pelvic CTA

## 2018-05-07 NOTE — Telephone Encounter (Signed)
CT angio scheduled for 05/11/18 2:00 at [facility] CT.  Patient will not drink oral solution for angio.  She needs to be NPO for 4 hours prior and to arrive 15 minutes prior to the appt.  Patient notified of the appt details and instructions.

## 2018-05-10 ENCOUNTER — Telehealth: Payer: Self-pay | Admitting: *Deleted

## 2018-05-10 NOTE — Telephone Encounter (Signed)
Copied from [place] [id number]. Topic: Inquiry >> May 09, 2018  3:33 PM Alfredia Ferguson R wrote: Reason for CRM: Pt called to see what type of mammogram she is suppose to ask for. Callback # [phone]

## 2018-05-10 NOTE — Telephone Encounter (Signed)
Pt notified of results/instructions and verbalized understanding.

## 2018-05-10 NOTE — Telephone Encounter (Signed)
Do you have a preference between regular and 3D?

## 2018-05-10 NOTE — Telephone Encounter (Signed)
No - whichever pt wishes to do/can afford.

## 2018-05-11 ENCOUNTER — Ambulatory Visit (INDEPENDENT_AMBULATORY_CARE_PROVIDER_SITE_OTHER)
Admission: RE | Admit: 2018-05-11 | Discharge: 2018-05-11 | Disposition: A | Discharge status: Home / Self Care | Payer: BLUE CROSS/BLUE SHIELD | Source: Ambulatory Visit | Attending: Gastroenterology | Admitting: Gastroenterology

## 2018-05-11 DIAGNOSIS — R1013 Epigastric pain: Secondary | ICD-10-CM

## 2018-05-11 MED ORDER — IOPAMIDOL (ISOVUE-370) INJECTION 76%
100.0000 mL | Freq: Once | INTRAVENOUS | Status: AC | PRN
  Administered 2018-05-11: 100 mL via INTRAVENOUS

## 2018-05-15 ENCOUNTER — Ambulatory Visit: Payer: BLUE CROSS/BLUE SHIELD | Admitting: Vascular Surgery

## 2018-05-15 ENCOUNTER — Encounter: Payer: Self-pay | Admitting: *Deleted

## 2018-05-15 ENCOUNTER — Telehealth: Payer: Self-pay | Admitting: *Deleted

## 2018-05-15 ENCOUNTER — Encounter: Payer: Self-pay | Admitting: Vascular Surgery

## 2018-05-15 ENCOUNTER — Ambulatory Visit (HOSPITAL_COMMUNITY)
Admission: RE | Admit: 2018-05-15 | Discharge: 2018-05-15 | Disposition: A | Discharge status: Home / Self Care | Payer: BLUE CROSS/BLUE SHIELD | Source: Ambulatory Visit | Attending: Vascular Surgery | Admitting: Vascular Surgery

## 2018-05-15 ENCOUNTER — Ambulatory Visit (INDEPENDENT_AMBULATORY_CARE_PROVIDER_SITE_OTHER)
Admission: RE | Admit: 2018-05-15 | Discharge: 2018-05-15 | Disposition: A | Discharge status: Home / Self Care | Payer: BLUE CROSS/BLUE SHIELD | Source: Ambulatory Visit | Attending: Vascular Surgery | Admitting: Vascular Surgery

## 2018-05-15 VITALS — BP 111/70 | HR 63 | Temp 97.1°F | Ht 63.0 in | Wt 96.8 lb

## 2018-05-15 DIAGNOSIS — Z95828 Presence of other vascular implants and grafts: Secondary | ICD-10-CM | POA: Diagnosis not present

## 2018-05-15 DIAGNOSIS — T82858A Stenosis of vascular prosthetic devices, implants and grafts, initial encounter: Secondary | ICD-10-CM | POA: Diagnosis not present

## 2018-05-15 DIAGNOSIS — X58XXXA Exposure to other specified factors, initial encounter: Secondary | ICD-10-CM | POA: Insufficient documentation

## 2018-05-15 DIAGNOSIS — K551 Chronic vascular disorders of intestine: Secondary | ICD-10-CM | POA: Diagnosis not present

## 2018-05-15 DIAGNOSIS — I739 Peripheral vascular disease, unspecified: Secondary | ICD-10-CM

## 2018-05-15 NOTE — Telephone Encounter (Signed)
After messaging from Dr. Trula Slade Patient called and instructed to continue Brilinta and ASA. Verbalized  understanding.

## 2018-05-15 NOTE — H&P (View-Only) (Signed)
[facility] [place]  Patient name: Erin [name] MRN: [id number] DOB: [birth year] Sex: female  REASON FOR VISIT: Evaluation of new diagnosis of mesenteric ischemia  HPI: Erin [name] is a 62 y.o. female here today for follow-up.  She has a very extensive past history for peripheral vascular disease.  In September 2018 she underwent an aortobifemoral bypass for severe ischemia in both lower extremities.  She had very small outflow vessels and subsequently underwent urgent bilateral femoral to popliteal bypasses with vein.  She had a protracted recovery after this major surgery but eventually recovered and is done well.  She did have acute coronary syndrome following her discharge from the hospital.  This revealed critical coronary disease and underwent angioplasty and stenting.  This was despite a negative preoperative Myoview.  She now presents with complaint of abdominal pain.  She reports that this is postprandial and she feels as though she is having tightness in her upper mid abdomen and has lost weight because of her fear of eating.  She underwent a CT scan for further evaluation of this and I have this for review.  He denies any lower extremity claudication symptoms.  She is here today with her husband  Past Medical History:  Diagnosis Date  . Allergy   . Alopecia   . Anal cancer ([place]) 08/14/13   invasive squamous cell ca, s/p radiation 10/20-11/26/14 60.4Gy/26fx and chemo  . Anxiety   . B12 deficiency anemia 09/14/2015  . Blood transfusion without reported diagnosis   . CAD (coronary artery disease), native coronary artery 10/13/2017   DES to mid RCA  . Chronic back pain   . Chronic daily headache 03/29/2013   takes bc powder  . Closed right hip fracture ([place]) 09/10/2015  . Depression   . DM (diabetes mellitus) ([place])    diet controlled; does not check BG  . GERD (gastroesophageal reflux disease)   . History of hiatal  hernia   . HLD (hyperlipidemia)   . Hot flashes   . Hyperlipemia 04/11/2014  . Hypokalemia 05/2016  . Hypothyroidism 09/10/2015  . IBS (irritable bowel syndrome) 03/29/2013  . 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 QT prolongation   . Tubular adenoma of colon 09/08/2003  . Vertigo   . Wears glasses     Family History  Problem Relation Age of Onset  . Arthritis Mother   . Hyperlipidemia Mother   . Heart disease Mother   . Hypertension Mother   . Stroke Mother 20  . Irritable bowel syndrome Mother   . Thyroid disease Mother   . Heart attack Mother   . Heart disease Father   . Hyperlipidemia Father   . Hypertension Father   . Stroke Father 47  . Thyroid disease Father   . Prostate cancer Father   . Heart attack Father   . Lung cancer Brother   . Heart attack Maternal Grandfather   . Heart attack Maternal Uncle   . Colon cancer Neg Hx   . Rectal cancer Neg Hx   . Stomach cancer Neg Hx     SOCIAL HISTORY: Social History   Tobacco Use  . Smoking status: Former Smoker    Packs/day: 0.50    Types: Cigarettes    Last attempt to quit: 10/07/2014    Years since quitting: 3.6  . Smokeless tobacco: Never Used  Substance Use Topics  . Alcohol use: No    Alcohol/week: 0.0 oz    Allergies  Allergen Reactions  . Amitiza [Lubiprostone] Diarrhea    Uncontrollable diarrhea  . Nortriptyline Other (See Comments)    Stomach distention  . Sulfa Antibiotics Nausea And Vomiting and Other (See Comments)    GI distress/pain  . Tramadol     Broke out in a rash/itching per patient  . Atorvastatin Nausea And Vomiting  . Claritin [Loratadine] Other (See Comments)    Hot flashes  . Dexamethasone Itching  . Prednisone Rash    Current Outpatient Medications  Medication Sig Dispense Refill  . acetaminophen (TYLENOL) 500 MG tablet Take 500 mg every 6 (six) hours as needed by mouth for headache (pain).    Marland Kitchen acyclovir ointment (ZOVIRAX) 5 % Apply 1 application topically every 3  (three) hours as needed (fever blisters).    Marland Kitchen aspirin EC 81 MG tablet Take 81 mg by mouth daily.    . Biotin 10000 MCG TABS Take 10,000 mcg by mouth daily.    Marland Kitchen dicyclomine (BENTYL) 10 MG capsule TAKE ONE CAPSULE BY MOUTH THREE TIMES DAILY BEFORE MEALS. DUE FOR FOLLOW UP IN MAY, NEEDS TO SCHEDULE AN APPOINTMENT 270 capsule 1  . DULoxetine (CYMBALTA) 20 MG capsule Take 2 capsules daily 180 capsule 3  . fluticasone (FLONASE) 50 MCG/ACT nasal spray USE 1 SPRAY IN EACH NOSTRIL DAILY 48 g 1  . gabapentin (NEURONTIN) 600 MG tablet TAKE 1 TABLET BY MOUTH THREE TIMES DAILY GENERIC EQUIVALENT FOR NEURONTIN 270 tablet 0  . levothyroxine (SYNTHROID, LEVOTHROID) 75 MCG tablet TAKE 1 TABLET BY MOUTH DAILY. GENERIC EQUIVALENT FOR SYNTHROID 90 tablet 0  . linaclotide (LINZESS) 145 MCG CAPS capsule Take 145 mcg by mouth as directed.    . meclizine (ANTIVERT) 12.5 MG tablet Take 1 tablet (12.5 mg total) by mouth 3 (three) times daily as needed for dizziness. 30 tablet 6  . metoprolol succinate (TOPROL-XL) 25 MG 24 hr tablet Take 1 tablet (25 mg total) by mouth 2 (two) times daily. 180 tablet 3  . nystatin-triamcinolone (MYCOLOG II) cream APPLY TO CORNERS OF THE MOUTH TWICE A DAY AS NEEDED FOR CRACKING/BURNING  2  . omeprazole (PRILOSEC) 40 MG capsule TAKE ONE CAPSULE BY MOUTH DAILY. NEED APPOINTMENT FOR FURTHER REFILLS 90 capsule 1  . potassium chloride SA (K-DUR,KLOR-CON) 20 MEQ tablet TAKE 1 TABLET BY MOUTH DAILY 90 tablet 1  . ticagrelor (BRILINTA) 90 MG TABS tablet Take 1 tablet (90 mg total) by mouth 2 (two) times daily. 180 tablet 3  . vitamin B-12 1000 MCG tablet Take 1 tablet (1,000 mcg total) by mouth daily. 30 tablet 0  . nitroGLYCERIN (NITROSTAT) 0.4 MG SL tablet Place 1 tablet (0.4 mg total) under the tongue every 5 (five) minutes as needed for chest pain. 100 tablet 1  . rosuvastatin (CRESTOR) 40 MG tablet Take 1 tablet (40 mg total) by mouth daily. 90 tablet 3   No current facility-administered  medications for this visit.     REVIEW OF SYSTEMS:  [X]  denotes positive finding, [ ]  denotes negative finding Cardiac  Comments:  Chest pain or chest pressure:    Shortness of breath upon exertion:    Short of breath when lying flat:    Irregular heart rhythm:        Vascular    Pain in calf, thigh, or hip brought on by ambulation:    Pain in feet at night that wakes you up from your sleep:     Blood clot in your veins:    Leg swelling:  PHYSICAL EXAM: Vitals:   05/15/18 1047  BP: 111/70  Pulse: 63  Temp: (!) 97.1 F (36.2 C)  TempSrc: Oral  SpO2: 98%  Weight: 96 lb 12.8 oz (43.9 kg)  Height: 5\' 3"  (1.6 m)    GENERAL: The patient is a well-nourished female, in no acute distress. The vital signs are documented above. CARDIOVASCULAR: 2+ femoral and 2+ dorsalis pedis pulses bilaterally. PULMONARY: There is good air exchange  MUSCULOSKELETAL: There are no major deformities or cyanosis. NEUROLOGIC: No focal weakness or paresthesias are detected. SKIN: There are no ulcers or rashes noted. PSYCHIATRIC: The patient has a normal affect.  DATA:  Normal flow.  Noninvasive studies in her aortobifemoral and femoropopliteal bypasses  I reviewed her CT scan.  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  I had a long discussion with the patient and her husband explaining the premature atherosclerosis in Ms. Mecham.  I reviewed an old CT scan from 2015 at that time she did have mild celiac artery stenosis and a completely normal [place] mesenteric artery.  I have recommended arteriography for further evaluation and probable endovascular treatment with SMA stenting and possible celiac stenting in the same setting.  We will coordinate this at her earliest convenience at [facility]    Rosetta Posner, MD [facility] Tel [phone] Pager [phone]

## 2018-05-15 NOTE — Telephone Encounter (Signed)
-----   Message from Serafina Mitchell, MD sent at 05/15/2018  3:53 PM EDT ----- Regarding: RE: [place] I do not stop anti-platelet meds for angiograms, so can continue Brilinta ----- Message ----- From: Willy Eddy, RN Sent: 05/15/2018  12:23 PM To: Nelva Bush, MD, Serafina Mitchell, MD Subject: FW: MEDICATION HOLD FOR PROCEDURE              Please read below and advise. B ----- Message ----- From: Nelva Bush, MD Sent: 05/15/2018  12:13 PM To: Willy Eddy, RN Subject: RE: MEDICATION HOLD FOR PROCEDURE              Please confirm with Dr. Trula Slade that he actually needs to hold Brilinta (ticagrelor), as we routinely perform coronary and peripheral angiography on patients taking medications like this (i.e. Brilinta, Plavix, and Effient).  My preference is to continue Brilinta for 12 months from the time of her MI in 09/2017.  However, if it must be stopped from Dr. Stephens Shire standpoint, it can be held for 5 days and restarted as soon as possible after the procedure.  She does not need to be bridged.  ----- Message ----- From: Willy Eddy, RN Sent: 05/15/2018  11:54 AM To: Nelva Bush, MD Subject: [place]                  Patient is scheduled for AORTOGRAM with mesenteric angiogram possible intervention for 05/22/18 with Dr. Trula Slade. Will need to hold Brilienta for 5 days prior. Requesting clearance and bridging if indicated. Thank you for your time with this patient. Becky RN

## 2018-05-15 NOTE — Progress Notes (Signed)
[facility] [place]  Patient name: Erin [name] MRN: [id number] DOB: 01-18-1956 Sex: female  REASON FOR VISIT: Evaluation of new diagnosis of mesenteric ischemia  HPI: Erin [name] is a 62 y.o. female here today for follow-up.  She has a very extensive past history for peripheral vascular disease.  In September 2018 she underwent an aortobifemoral bypass for severe ischemia in both lower extremities.  She had very small outflow vessels and subsequently underwent urgent bilateral femoral to popliteal bypasses with vein.  She had a protracted recovery after this major surgery but eventually recovered and is done well.  She did have acute coronary syndrome following her discharge from the hospital.  This revealed critical coronary disease and underwent angioplasty and stenting.  This was despite a negative preoperative Myoview.  She now presents with complaint of abdominal pain.  She reports that this is postprandial and she feels as though she is having tightness in her upper mid abdomen and has lost weight because of her fear of eating.  She underwent a CT scan for further evaluation of this and I have this for review.  He denies any lower extremity claudication symptoms.  She is here today with her husband  Past Medical History:  Diagnosis Date  . Allergy   . Alopecia   . 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 Anxiety   . B12 deficiency anemia 09/14/2015  . Blood transfusion without reported diagnosis   . CAD (coronary artery disease), native coronary artery 10/13/2017   DES to mid RCA  . Chronic back pain   . Chronic daily headache 03/29/2013   takes bc powder  . Closed right hip fracture ([place]) 09/10/2015  . Depression   . DM (diabetes mellitus) ([place])    diet controlled; does not check BG  . GERD (gastroesophageal reflux disease)   . History of hiatal  hernia   . HLD (hyperlipidemia)   . Hot flashes   . Hyperlipemia 04/11/2014  . Hypokalemia 05/2016  . Hypothyroidism 09/10/2015  . IBS (irritable bowel syndrome) 03/29/2013  . 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 QT prolongation   . Tubular adenoma of colon 09/08/2003  . Vertigo   . Wears glasses     Family History  Problem Relation Age of Onset  . Arthritis Mother   . Hyperlipidemia Mother   . Heart disease Mother   . Hypertension Mother   . Stroke Mother 74  . Irritable bowel syndrome Mother   . Thyroid disease Mother   . Heart attack Mother   . Heart disease Father   . Hyperlipidemia Father   . Hypertension Father   . Stroke Father 68  . Thyroid disease Father   . Prostate cancer Father   . Heart attack Father   . Lung cancer Brother   . Heart attack Maternal Grandfather   . Heart attack Maternal Uncle   . Colon cancer Neg Hx   . Rectal cancer Neg Hx   . Stomach cancer Neg Hx     SOCIAL HISTORY: Social History   Tobacco Use  . Smoking status: Former Smoker    Packs/day: 0.50    Types: Cigarettes    Last attempt to quit: 10/07/2014    Years since quitting: 3.6  . Smokeless tobacco: Never Used  Substance Use Topics  . Alcohol use: No    Alcohol/week: 0.0 oz    Allergies  Allergen Reactions  . Amitiza [Lubiprostone] Diarrhea    Uncontrollable diarrhea  . Nortriptyline Other (See Comments)    Stomach distention  . Sulfa Antibiotics Nausea And Vomiting and Other (See Comments)    GI distress/pain  . Tramadol     Broke out in a rash/itching per patient  . Atorvastatin Nausea And Vomiting  . Claritin [Loratadine] Other (See Comments)    Hot flashes  . Dexamethasone Itching  . Prednisone Rash    Current Outpatient Medications  Medication Sig Dispense Refill  . acetaminophen (TYLENOL) 500 MG tablet Take 500 mg every 6 (six) hours as needed by mouth for headache (pain).    Marland Kitchen acyclovir ointment (ZOVIRAX) 5 % Apply 1 application topically every 3  (three) hours as needed (fever blisters).    Marland Kitchen aspirin EC 81 MG tablet Take 81 mg by mouth daily.    . Biotin 10000 MCG TABS Take 10,000 mcg by mouth daily.    Marland Kitchen dicyclomine (BENTYL) 10 MG capsule TAKE ONE CAPSULE BY MOUTH THREE TIMES DAILY BEFORE MEALS. DUE FOR FOLLOW UP IN MAY, NEEDS TO SCHEDULE AN APPOINTMENT 270 capsule 1  . DULoxetine (CYMBALTA) 20 MG capsule Take 2 capsules daily 180 capsule 3  . fluticasone (FLONASE) 50 MCG/ACT nasal spray USE 1 SPRAY IN EACH NOSTRIL DAILY 48 g 1  . gabapentin (NEURONTIN) 600 MG tablet TAKE 1 TABLET BY MOUTH THREE TIMES DAILY GENERIC EQUIVALENT FOR NEURONTIN 270 tablet 0  . levothyroxine (SYNTHROID, LEVOTHROID) 75 MCG tablet TAKE 1 TABLET BY MOUTH DAILY. GENERIC EQUIVALENT FOR SYNTHROID 90 tablet 0  . linaclotide (LINZESS) 145 MCG CAPS capsule Take 145 mcg by mouth as directed.    . meclizine (ANTIVERT) 12.5 MG tablet Take 1 tablet (12.5 mg total) by mouth 3 (three) times daily as needed for dizziness. 30 tablet 6  . metoprolol succinate (TOPROL-XL) 25 MG 24 hr tablet Take 1 tablet (25 mg total) by mouth 2 (two) times daily. 180 tablet 3  . nystatin-triamcinolone (MYCOLOG II) cream APPLY TO CORNERS OF THE MOUTH TWICE A DAY AS NEEDED FOR CRACKING/BURNING  2  . omeprazole (PRILOSEC) 40 MG capsule TAKE ONE CAPSULE BY MOUTH DAILY. NEED APPOINTMENT FOR FURTHER REFILLS 90 capsule 1  . potassium chloride SA (K-DUR,KLOR-CON) 20 MEQ tablet TAKE 1 TABLET BY MOUTH DAILY 90 tablet 1  . ticagrelor (BRILINTA) 90 MG TABS tablet Take 1 tablet (90 mg total) by mouth 2 (two) times daily. 180 tablet 3  . vitamin B-12 1000 MCG tablet Take 1 tablet (1,000 mcg total) by mouth daily. 30 tablet 0  . nitroGLYCERIN (NITROSTAT) 0.4 MG SL tablet Place 1 tablet (0.4 mg total) under the tongue every 5 (five) minutes as needed for chest pain. 100 tablet 1  . rosuvastatin (CRESTOR) 40 MG tablet Take 1 tablet (40 mg total) by mouth daily. 90 tablet 3   No current facility-administered  medications for this visit.     REVIEW OF SYSTEMS:  [X]  denotes positive finding, [ ]  denotes negative finding Cardiac  Comments:  Chest pain or chest pressure:    Shortness of breath upon exertion:    Short of breath when lying flat:    Irregular heart rhythm:        Vascular    Pain in calf, thigh, or hip brought on by ambulation:    Pain in feet at night that wakes you up from your sleep:     Blood clot in your veins:    Leg swelling:  PHYSICAL EXAM: Vitals:   05/15/18 1047  BP: 111/70  Pulse: 63  Temp: (!) 97.1 F (36.2 C)  TempSrc: Oral  SpO2: 98%  Weight: 96 lb 12.8 oz (43.9 kg)  Height: 5\' 3"  (1.6 m)    GENERAL: The patient is a well-nourished female, in no acute distress. The vital signs are documented above. CARDIOVASCULAR: 2+ femoral and 2+ dorsalis pedis pulses bilaterally. PULMONARY: There is good air exchange  MUSCULOSKELETAL: There are no major deformities or cyanosis. NEUROLOGIC: No focal weakness or paresthesias are detected. SKIN: There are no ulcers or rashes noted. PSYCHIATRIC: The patient has a normal affect.  DATA:  Normal flow.  Noninvasive studies in her aortobifemoral and femoropopliteal bypasses  I reviewed her CT scan.  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  I had a long discussion with the patient and her husband explaining the premature atherosclerosis in Ms. Fetters.  I reviewed an old CT scan from 2015 at that time she did have mild celiac artery stenosis and a completely normal [place] mesenteric artery.  I have recommended arteriography for further evaluation and probable endovascular treatment with SMA stenting and possible celiac stenting in the same setting.  We will coordinate this at her earliest convenience at [facility]    Rosetta Posner, MD [facility] Tel [phone] Pager [phone]

## 2018-05-16 ENCOUNTER — Other Ambulatory Visit: Payer: Self-pay | Admitting: *Deleted

## 2018-05-22 ENCOUNTER — Ambulatory Visit (HOSPITAL_COMMUNITY)
Admission: RE | Admit: 2018-05-22 | Discharge: 2018-05-22 | Disposition: A | Discharge status: Home / Self Care | Payer: BLUE CROSS/BLUE SHIELD | Source: Ambulatory Visit | Attending: Surgery | Admitting: Surgery

## 2018-05-22 ENCOUNTER — Ambulatory Visit (HOSPITAL_COMMUNITY)
Admission: RE | Disposition: A | Discharge status: Home / Self Care | Payer: Self-pay | Source: Ambulatory Visit | Attending: Surgery

## 2018-05-22 ENCOUNTER — Telehealth: Payer: Self-pay | Admitting: Vascular Surgery

## 2018-05-22 DIAGNOSIS — F329 Major depressive disorder, single episode, unspecified: Secondary | ICD-10-CM | POA: Insufficient documentation

## 2018-05-22 DIAGNOSIS — G8929 Other chronic pain: Secondary | ICD-10-CM | POA: Insufficient documentation

## 2018-05-22 DIAGNOSIS — Z7902 Long term (current) use of antithrombotics/antiplatelets: Secondary | ICD-10-CM | POA: Diagnosis not present

## 2018-05-22 DIAGNOSIS — K219 Gastro-esophageal reflux disease without esophagitis: Secondary | ICD-10-CM | POA: Diagnosis not present

## 2018-05-22 DIAGNOSIS — K551 Chronic vascular disorders of intestine: Secondary | ICD-10-CM | POA: Diagnosis not present

## 2018-05-22 DIAGNOSIS — D519 Vitamin B12 deficiency anemia, unspecified: Secondary | ICD-10-CM | POA: Insufficient documentation

## 2018-05-22 DIAGNOSIS — I774 Celiac artery compression syndrome: Secondary | ICD-10-CM | POA: Insufficient documentation

## 2018-05-22 DIAGNOSIS — I2510 Atherosclerotic heart disease of native coronary artery without angina pectoris: Secondary | ICD-10-CM | POA: Diagnosis not present

## 2018-05-22 DIAGNOSIS — E1151 Type 2 diabetes mellitus with diabetic peripheral angiopathy without gangrene: Secondary | ICD-10-CM | POA: Insufficient documentation

## 2018-05-22 DIAGNOSIS — Z7982 Long term (current) use of aspirin: Secondary | ICD-10-CM | POA: Insufficient documentation

## 2018-05-22 DIAGNOSIS — K55059 Acute (reversible) ischemia of intestine, part and extent unspecified: Secondary | ICD-10-CM | POA: Diagnosis not present

## 2018-05-22 DIAGNOSIS — M549 Dorsalgia, unspecified: Secondary | ICD-10-CM | POA: Insufficient documentation

## 2018-05-22 DIAGNOSIS — F419 Anxiety disorder, unspecified: Secondary | ICD-10-CM | POA: Insufficient documentation

## 2018-05-22 DIAGNOSIS — Z87891 Personal history of nicotine dependence: Secondary | ICD-10-CM | POA: Insufficient documentation

## 2018-05-22 DIAGNOSIS — Z7951 Long term (current) use of inhaled steroids: Secondary | ICD-10-CM | POA: Insufficient documentation

## 2018-05-22 DIAGNOSIS — E785 Hyperlipidemia, unspecified: Secondary | ICD-10-CM | POA: Diagnosis not present

## 2018-05-22 DIAGNOSIS — K589 Irritable bowel syndrome without diarrhea: Secondary | ICD-10-CM | POA: Insufficient documentation

## 2018-05-22 DIAGNOSIS — E039 Hypothyroidism, unspecified: Secondary | ICD-10-CM | POA: Insufficient documentation

## 2018-05-22 DIAGNOSIS — Z882 Allergy status to sulfonamides status: Secondary | ICD-10-CM | POA: Diagnosis not present

## 2018-05-22 HISTORY — PX: PERIPHERAL VASCULAR INTERVENTION: CATH118257

## 2018-05-22 LAB — POCT I-STAT, CHEM 8
BUN: 13 mg/dL (ref 8–23)
CHLORIDE: 106 mmol/L (ref 98–111)
Calcium, Ion: 1.03 mmol/L — ABNORMAL LOW (ref 1.15–1.40)
Creatinine, Ser: 0.8 mg/dL (ref 0.44–1.00)
GLUCOSE: 88 mg/dL (ref 70–99)
HEMATOCRIT: 38 % (ref 36.0–46.0)
Hemoglobin: 12.9 g/dL (ref 12.0–15.0)
POTASSIUM: 3.2 mmol/L — AB (ref 3.5–5.1)
Sodium: 140 mmol/L (ref 135–145)
TCO2: 21 mmol/L — ABNORMAL LOW (ref 22–32)

## 2018-05-22 LAB — POCT ACTIVATED CLOTTING TIME
ACTIVATED CLOTTING TIME: 219 s
ACTIVATED CLOTTING TIME: 169 s
Activated Clotting Time: 208 seconds
Activated Clotting Time: 186 seconds

## 2018-05-22 SURGERY — ABDOMINAL AORTAGRAM
Anesthesia: LOCAL

## 2018-05-22 MED ORDER — NITROGLYCERIN 1 MG/10 ML FOR IR/CATH LAB
INTRA_ARTERIAL | Status: AC
  Filled 2018-05-22: qty 10

## 2018-05-22 MED ORDER — MORPHINE SULFATE (PF) 10 MG/ML IV SOLN: 2.0000 mg | INTRAVENOUS | Status: DC | PRN

## 2018-05-22 MED ORDER — SODIUM CHLORIDE 0.9 % WEIGHT BASED INFUSION: 1.0000 mL/kg/h | INTRAVENOUS | Status: DC

## 2018-05-22 MED ORDER — LABETALOL HCL 5 MG/ML IV SOLN: 10.0000 mg | INTRAVENOUS | Status: DC | PRN

## 2018-05-22 MED ORDER — IODIXANOL 320 MG/ML IV SOLN
INTRAVENOUS | Status: DC | PRN
  Administered 2018-05-22: 130 mL via INTRA_ARTERIAL

## 2018-05-22 MED ORDER — NITROGLYCERIN 1 MG/10 ML FOR IR/CATH LAB
INTRA_ARTERIAL | Status: DC | PRN
  Administered 2018-05-22: 200 ug via INTRA_ARTERIAL

## 2018-05-22 MED ORDER — ACETAMINOPHEN 325 MG PO TABS: 650.0000 mg | ORAL_TABLET | ORAL | Status: DC | PRN

## 2018-05-22 MED ORDER — MIDAZOLAM HCL 2 MG/2ML IJ SOLN
INTRAMUSCULAR | Status: DC | PRN
  Administered 2018-05-22 (×2): 1 mg via INTRAVENOUS

## 2018-05-22 MED ORDER — SODIUM CHLORIDE 0.9% FLUSH: 3.0000 mL | INTRAVENOUS | Status: DC | PRN

## 2018-05-22 MED ORDER — LIDOCAINE HCL (PF) 1 % IJ SOLN
INTRAMUSCULAR | Status: AC
  Filled 2018-05-22: qty 30

## 2018-05-22 MED ORDER — SODIUM CHLORIDE 0.9 % IV SOLN
INTRAVENOUS | Status: DC
  Administered 2018-05-22: 06:00:00 via INTRAVENOUS

## 2018-05-22 MED ORDER — HEPARIN SODIUM (PORCINE) 1000 UNIT/ML IJ SOLN
INTRAMUSCULAR | Status: DC | PRN
  Administered 2018-05-22: 1000 [IU] via INTRAVENOUS
  Administered 2018-05-22: 2000 [IU] via INTRAVENOUS
  Administered 2018-05-22: 3000 [IU] via INTRAVENOUS

## 2018-05-22 MED ORDER — MIDAZOLAM HCL 2 MG/2ML IJ SOLN
INTRAMUSCULAR | Status: AC
  Filled 2018-05-22: qty 2

## 2018-05-22 MED ORDER — FENTANYL CITRATE (PF) 100 MCG/2ML IJ SOLN
INTRAMUSCULAR | Status: DC | PRN
  Administered 2018-05-22 (×2): 25 ug via INTRAVENOUS

## 2018-05-22 MED ORDER — HYDRALAZINE HCL 20 MG/ML IJ SOLN
5.0000 mg | INTRAMUSCULAR | Status: DC | PRN
Start: 2018-05-22 — End: 2018-05-22

## 2018-05-22 MED ORDER — SODIUM CHLORIDE 0.9 % IV SOLN: 250.0000 mL | INTRAVENOUS | Status: DC | PRN

## 2018-05-22 MED ORDER — OXYCODONE HCL 5 MG PO TABS: 5.0000 mg | ORAL_TABLET | ORAL | Status: DC | PRN

## 2018-05-22 MED ORDER — FENTANYL CITRATE (PF) 100 MCG/2ML IJ SOLN
INTRAMUSCULAR | Status: AC
  Filled 2018-05-22: qty 2

## 2018-05-22 MED ORDER — LIDOCAINE HCL (PF) 1 % IJ SOLN
INTRAMUSCULAR | Status: DC | PRN
  Administered 2018-05-22: 2 mL

## 2018-05-22 MED ORDER — HEPARIN (PORCINE) IN NACL 1000-0.9 UT/500ML-% IV SOLN
INTRAVENOUS | Status: AC
  Filled 2018-05-22: qty 1000

## 2018-05-22 MED ORDER — SODIUM CHLORIDE 0.9% FLUSH: 3.0000 mL | Freq: Two times a day (BID) | INTRAVENOUS | Status: DC

## 2018-05-22 MED ORDER — ONDANSETRON HCL 4 MG/2ML IJ SOLN: 4.0000 mg | Freq: Four times a day (QID) | INTRAMUSCULAR | Status: DC | PRN

## 2018-05-22 SURGICAL SUPPLY — 19 items
CATH ANGIO 5F PIGTAIL 100CM (CATHETERS) ×1 IMPLANT
CATHETER LAUNCHER 6FR MP1 (CATHETERS) ×1 IMPLANT
KIT ENCORE 26 ADVANTAGE (KITS) ×1 IMPLANT
KIT MICROPUNCTURE NIT STIFF (SHEATH) ×1 IMPLANT
KIT PV (KITS) ×3 IMPLANT
PAD ELECT DEFIB RADIOL ZOLL (MISCELLANEOUS) ×1 IMPLANT
SHEATH PINNACLE 6F 10CM (SHEATH) ×1 IMPLANT
STENT HERCULINK RX 6.5X12X135 (Permanent Stent) ×1 IMPLANT
STENT HERCULINK RX 6.5X15X135 (Permanent Stent) ×1 IMPLANT
STOPCOCK MORSE 400PSI 3WAY (MISCELLANEOUS) ×2 IMPLANT
SYR MEDRAD MARK V 150ML (SYRINGE) ×3 IMPLANT
TRANSDUCER W/STOPCOCK (MISCELLANEOUS) ×3 IMPLANT
TRAY PV CATH (CUSTOM PROCEDURE TRAY) ×3 IMPLANT
TUBING HIGH PRESSURE 120CM (CONNECTOR) ×1 IMPLANT
WIRE BENTSON .035X145CM (WIRE) ×1 IMPLANT
WIRE G V18X300CM (WIRE) ×1 IMPLANT
WIRE HI TORQ VERSACORE J 260CM (WIRE) ×1 IMPLANT
WIRE ROSEN-J .035X260CM (WIRE) ×1 IMPLANT
WIRE SPARTACORE .014X300CM (WIRE) ×1 IMPLANT

## 2018-05-22 NOTE — Op Note (Signed)
    Patient name: Erin [name] MRN: [id number] DOB: 1956/01/02 Sex: female  05/22/2018 Pre-operative Diagnosis: mesenteric stenosis Post-operative diagnosis:  Same Surgeon:  [name] Procedure Performed:  1.  Ultrasound-guided access, left brachial artery  2.  Abdominal aortogram  3.  Stent, superior mesenteric artery  4.  Stent, celiac artery  5.  Conscious sedation (58 minutes)   Indications: The patient has a history of an aortobifemoral bypass graft.  She has developed mesenteric stenosis which is symptomatic.  She has CT scan showing high-grade lesions.  She is here today for further evaluation and possible evaluation.  Procedure:  The patient was identified in the holding area and taken to room 8.  The patient was then placed supine on the table and prepped and draped in the usual sterile fashion.  A time out was called.  Conscious sedation was administered with the use of IV fentanyl and Versed under continuous physician and nurse monitoring.  Heart rate, blood pressure, and oxygen saturation were continuously monitored.  Ultrasound was used to evaluate the left brachial artery.  It was patent .  A digital ultrasound image was acquired.  A micropuncture needle was used to access the left brachial artery under ultrasound guidance.  An 018 wire was advanced without resistance and a micropuncture sheath was placed.  The 018 wire was removed and a benson wire was placed.  The micropuncture sheath was exchanged for a 6 french sheath.  A pigtail catheter and a Bentson wire were used to navigate the catheter into the descending thoracic aorta.  The cath was placed at the level of T12 and abdominal aortogram was performed in AP and lateral projection.  Findings:   Aortogram: Renal arteries appear to be patent.  The aortobifemoral bypass graft is widely patent.  There is a 90% ostial superior mesenteric artery stenosis and an 80% ostial celiac artery stenosis.   Intervention: After the  above images were acquired the decision was made to proceed with intervention.  A multipurpose catheter was inserted into the aorta.  Using this and a [place] core wire the superior mesenteric artery was selected.  I elected to primarily stent this.  A 6.5 x 15 Herculink balloon expandable stent was placed across the lesion, extending backwards into the aorta.  He was taken to rated pressure.  Completion imaging showed resolution of the stenosis.  Next, using the same catheter and wire the celiac artery was cannulated.  I selected a 6.5 x 12 Herculink stent and deployed this appropriately placed into the aorta and across the lesion.  The balloon was taken to rated pressure.  Completion imaging showed resolution of the stenosis.  At this point catheters and wires were removed.  The patient was taken to 1 year for sheath pull once her coagulation profile corrects.  Impression:  #1 90% superior mesenteric artery stenosis successfully treated using a 6.5 x 15 balloon expandable stent  #2 80% celiac artery ostial stenosis successfully treated using a 6.5 x 12 Herculink balloon expandable stent   V. [name], M.D. [facility] [place] Office: [phone] Pager:  [phone]

## 2018-05-22 NOTE — Telephone Encounter (Signed)
sch appt spk to pt husband 07/03/18 8am Mesenteric 845am p/o MD

## 2018-05-22 NOTE — Progress Notes (Signed)
24fr sheath aspirated and removed from left brachial artery. Manual pressure applied for 30 minutes.Sp02 95% as measured in left thumb.  Site level 0, tegaderm dressing applied, bedrest instructions given. Left radial pulse present with doppler.   Bedrest begins at 10:30:00

## 2018-05-22 NOTE — Discharge Instructions (Signed)
Brachial Site Care °Refer to this sheet in the next few weeks. These instructions provide you with information about caring for yourself after your procedure. Your health care provider may also give you more specific instructions. Your treatment has been planned according to current medical practices, but problems sometimes occur. Call your health care provider if you have any problems or questions after your procedure. °What can I expect after the procedure? °After your procedure, it is typical to have the following: °· Bruising at the brachial site that usually fades within 1-2 weeks. °· Blood collecting in the tissue (hematoma) that may be painful to the touch. It should usually decrease in size and tenderness within 1-2 weeks. ° °Follow these instructions at home: °· Take medicines only as directed by your health care provider. °· You may shower 24-48 hours after the procedure or as directed by your health care provider. Remove the bandage (dressing) and gently wash the site with plain soap and water. Pat the area dry with a clean towel. Do not rub the site, because this may cause bleeding. °· Do not take baths, swim, or use a hot tub until your health care provider approves. °· Check your insertion site every day for redness, swelling, or drainage. °· Do not apply powder or lotion to the site. °· Do not flex or bend the affected arm for 24 hours or as directed by your health care provider. °· Do not push or pull heavy objects with the affected arm for 24 hours or as directed by your health care provider. °· Do not lift over 10 lb (4.5 kg) for 5 days after your procedure or as directed by your health care provider. °· Ask your health care provider when it is okay to: °? Return to work or school. °? Resume usual physical activities or sports. °? Resume sexual activity. °· Do not drive home if you are discharged the same day as the procedure. Have someone else drive you. °· You may drive 24 hours after the  procedure unless otherwise instructed by your health care provider. °· Do not operate machinery or power tools for 24 hours after the procedure. °· If your procedure was done as an outpatient procedure, which means that you went home the same day as your procedure, a responsible adult should be with you for the first 24 hours after you arrive home. °· Keep all follow-up visits as directed by your health care provider. This is important. °Contact a health care provider if: °· You have a fever. °· You have chills. °· You have increased bleeding from the radial site. Hold pressure on the site. °Get help right away if: °· You have unusual pain at the brachial site. °· You have redness, warmth, or swelling at the brachial site. °· You have drainage (other than a small amount of blood on the dressing) from the brachial site. °· The brachial site is bleeding, and the bleeding does not stop after 30 minutes of holding steady pressure on the site. °· Your arm or hand becomes pale, cool, tingly, or numb. °This information is not intended to replace advice given to you by your health care provider. Make sure you discuss any questions you have with your health care provider. °Document Released: 12/10/2010 Document Revised: 04/14/2016 Document Reviewed: 05/26/2014 °Elsevier Interactive Patient Education © 2018 [employer] ° °

## 2018-05-22 NOTE — Interval H&P Note (Signed)
History and Physical Interval Note:  05/22/2018 7:20 AM  Erin [name]  has presented today for surgery, with the diagnosis of mesentric insufficifency  The various methods of treatment have been discussed with the patient and family. After consideration of risks, benefits and other options for treatment, the patient has consented to  Procedure(s): ABDOMINAL AORTOGRAM W/LOWER EXTREMITY (N/A) VISCERAL ANGIOGRAPHY (N/A) as a surgical intervention .  The patient's history has been reviewed, patient examined, no change in status, stable for surgery.  I have reviewed the patient's chart and labs.  Questions were answered to the patient's satisfaction.     [name]

## 2018-05-23 ENCOUNTER — Encounter (HOSPITAL_COMMUNITY): Payer: Self-pay | Admitting: Surgery

## 2018-05-23 ENCOUNTER — Other Ambulatory Visit: Payer: Self-pay

## 2018-05-23 DIAGNOSIS — Z48812 Encounter for surgical aftercare following surgery on the circulatory system: Secondary | ICD-10-CM

## 2018-05-23 DIAGNOSIS — K551 Chronic vascular disorders of intestine: Secondary | ICD-10-CM

## 2018-05-23 MED FILL — Heparin Sod (Porcine)-NaCl IV Soln 1000 Unit/500ML-0.9%: INTRAVENOUS | Qty: 1000 | Status: AC

## 2018-06-02 ENCOUNTER — Other Ambulatory Visit: Payer: Self-pay | Admitting: Gastroenterology

## 2018-06-02 DIAGNOSIS — R112 Nausea with vomiting, unspecified: Secondary | ICD-10-CM

## 2018-06-02 DIAGNOSIS — K2970 Gastritis, unspecified, without bleeding: Secondary | ICD-10-CM

## 2018-06-04 ENCOUNTER — Other Ambulatory Visit: Payer: Self-pay | Admitting: *Deleted

## 2018-06-04 MED ORDER — LEVOTHYROXINE SODIUM 75 MCG PO TABS: ORAL_TABLET | ORAL | 0 refills | Status: DC

## 2018-06-04 MED ORDER — GABAPENTIN 600 MG PO TABS: ORAL_TABLET | ORAL | 0 refills | Status: DC

## 2018-06-04 NOTE — Telephone Encounter (Signed)
Rx done. 

## 2018-06-12 ENCOUNTER — Other Ambulatory Visit: Payer: Self-pay | Admitting: Family Medicine

## 2018-06-12 ENCOUNTER — Other Ambulatory Visit: Payer: Self-pay | Admitting: Gastroenterology

## 2018-06-28 ENCOUNTER — Encounter: Payer: Self-pay | Admitting: Gastroenterology

## 2018-07-03 ENCOUNTER — Ambulatory Visit (HOSPITAL_COMMUNITY)
Admission: RE | Admit: 2018-07-03 | Discharge: 2018-07-03 | Disposition: A | Discharge status: Home / Self Care | Payer: BLUE CROSS/BLUE SHIELD | Source: Ambulatory Visit | Attending: Vascular Surgery | Admitting: Vascular Surgery

## 2018-07-03 ENCOUNTER — Ambulatory Visit (INDEPENDENT_AMBULATORY_CARE_PROVIDER_SITE_OTHER): Payer: BLUE CROSS/BLUE SHIELD | Admitting: Vascular Surgery

## 2018-07-03 ENCOUNTER — Encounter: Payer: Self-pay | Admitting: Vascular Surgery

## 2018-07-03 ENCOUNTER — Other Ambulatory Visit: Payer: Self-pay

## 2018-07-03 VITALS — BP 130/73 | HR 56 | Resp 18 | Ht 69.0 in | Wt 100.3 lb

## 2018-07-03 DIAGNOSIS — Z87891 Personal history of nicotine dependence: Secondary | ICD-10-CM | POA: Diagnosis not present

## 2018-07-03 DIAGNOSIS — E785 Hyperlipidemia, unspecified: Secondary | ICD-10-CM | POA: Insufficient documentation

## 2018-07-03 DIAGNOSIS — Z48812 Encounter for surgical aftercare following surgery on the circulatory system: Secondary | ICD-10-CM | POA: Diagnosis not present

## 2018-07-03 DIAGNOSIS — I2510 Atherosclerotic heart disease of native coronary artery without angina pectoris: Secondary | ICD-10-CM | POA: Insufficient documentation

## 2018-07-03 DIAGNOSIS — K551 Chronic vascular disorders of intestine: Secondary | ICD-10-CM

## 2018-07-03 NOTE — Progress Notes (Signed)
V[facility] and [facility]in Specialist of [place]  Patient name: Erin [name] MRN: [id number] DOB: 1956/02/21 Sex: female  REASON FOR VISIT: Follow-up mesenteric ischemia treatment with stents  HPI: Erin [name] is a 62 y.o. female with a complex history of arterial occlusive disease.  She is status post aortobifemoral bypass grafting for critical limb ischemia.  She presented late June of this year with classic symptoms of mesenteric ischemia.  She had lost down to 86 pounds and was extremely frail.  She had pain after eating.  She underwent arteriography with Dr. Trula Slade and was found to have critical stenosis of her superior mesenteric and celiac artery.  These were both successfully treated with stenting.  She had immediate relief and now has gained weight up to 100 pounds.  She looks quite good today and is having no difficulty  Past Medical History:  Diagnosis Date  . Allergy   . Alopecia   . 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 Anxiety   . B12 deficiency anemia 09/14/2015  . Blood transfusion without reported diagnosis   . CAD (coronary artery disease), native coronary artery 10/13/2017   DES to mid RCA  . Chronic back pain   . Chronic daily headache 03/29/2013   takes bc powder  . Closed right hip fracture ([place]) 09/10/2015  . Depression   . DM (diabetes mellitus) ([place])    diet controlled; does not check BG  . GERD (gastroesophageal reflux disease)   . History of hiatal hernia   . HLD (hyperlipidemia)   . Hot flashes   . Hyperlipemia 04/11/2014  . Hypokalemia 05/2016  . Hypothyroidism 09/10/2015  . IBS (irritable bowel syndrome) 03/29/2013  . 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 QT prolongation   . Tubular adenoma of colon 09/08/2003  . Vertigo   . Wears glasses     Family History  Problem Relation Age of Onset  . Arthritis Mother   . Hyperlipidemia  Mother   . Heart disease Mother   . Hypertension Mother   . Stroke Mother 32  . Irritable bowel syndrome Mother   . Thyroid disease Mother   . Heart attack Mother   . Heart disease Father   . Hyperlipidemia Father   . Hypertension Father   . Stroke Father 81  . Thyroid disease Father   . Prostate cancer Father   . Heart attack Father   . Lung cancer Brother   . Heart attack Maternal Grandfather   . Heart attack Maternal Uncle   . Colon cancer Neg Hx   . Rectal cancer Neg Hx   . Stomach cancer Neg Hx     SOCIAL HISTORY: Social History   Tobacco Use  . Smoking status: Former Smoker    Packs/day: 0.50    Types: Cigarettes    Last attempt to quit: 10/07/2014    Years since quitting: 3.7  . Smokeless tobacco: Never Used  Substance Use Topics  . Alcohol use: No    Alcohol/week: 0.0 standard drinks    Allergies  Allergen Reactions  . Amitiza [Lubiprostone] Diarrhea    Uncontrollable diarrhea  . Nortriptyline Other (See Comments)    Stomach distention  . Sulfa Antibiotics Nausea And Vomiting and Other (See Comments)    GI distress/pain  . Tramadol Itching and Rash  . Atorvastatin Nausea And Vomiting  . Claritin [Loratadine] Other (See Comments)    Hot flashes  .  Dexamethasone Itching  . Prednisone Rash    Current Outpatient Medications  Medication Sig Dispense Refill  . acetaminophen (TYLENOL) 500 MG tablet Take 1,000 mg by mouth daily as needed for headache (pain).     Marland Kitchen acyclovir ointment (ZOVIRAX) 5 % Apply 1 application topically every 3 (three) hours as needed (fever blisters).    Marland Kitchen aspirin EC 81 MG tablet Take 81 mg by mouth daily.    . Cholecalciferol (VITAMIN D) 2000 units tablet Take 2,000 Units by mouth daily.    Marland Kitchen dicyclomine (BENTYL) 10 MG capsule TAKE ONE CAPSULE BY MOUTH THREE TIMES DAILY BEFORE MEALS. DUE FOR FOLLOW UP IN MAY, NEEDS TO SCHEDULE AN APPOINTMENT 270 capsule 1  . DULoxetine (CYMBALTA) 20 MG capsule Take 2 capsules daily (Patient taking  differently: Take 40 mg by mouth daily. ) 180 capsule 3  . fluticasone (FLONASE) 50 MCG/ACT nasal spray USE 1 SPRAY IN EACH NOSTRIL DAILY 48 g 1  . gabapentin (NEURONTIN) 600 MG tablet TAKE 1 TABLET BY MOUTH THREE TIMES DAILY GENERIC EQUIVALENT FOR NEURONTIN 270 tablet 0  . levothyroxine (SYNTHROID, LEVOTHROID) 75 MCG tablet TAKE 1 TABLET BY MOUTH DAILY. GENERIC EQUIVALENT FOR SYNTHROID 90 tablet 0  . LINZESS 145 MCG CAPS capsule TAKE ONE CAPSULE BY MOUTH DAILY BEFORE BREAKFAST 90 capsule 2  . meclizine (ANTIVERT) 12.5 MG tablet Take 1 tablet (12.5 mg total) by mouth 3 (three) times daily as needed for dizziness. 30 tablet 6  . metoprolol succinate (TOPROL-XL) 25 MG 24 hr tablet Take 1 tablet (25 mg total) by mouth 2 (two) times daily. 180 tablet 3  . nystatin-triamcinolone (MYCOLOG II) cream APPLY TO CORNERS OF THE MOUTH TWICE A DAY AS NEEDED FOR CRACKING/BURNING  2  . omeprazole (PRILOSEC) 40 MG capsule TAKE ONE CAPSULE BY MOUTH DAILY. NEED APPOINTMENT FOR FURTHER REFILLS (Patient taking differently: Take 40 mg by mouth twice daily) 90 capsule 1  . potassium chloride SA (K-DUR,KLOR-CON) 20 MEQ tablet TAKE 1 TABLET BY MOUTH DAILY 90 tablet 1  . Tetrahydrozoline HCl (VISINE OP) Place 1 drop into both eyes daily as needed (redness).    . ticagrelor (BRILINTA) 90 MG TABS tablet Take 1 tablet (90 mg total) by mouth 2 (two) times daily. 180 tablet 3  . vitamin B-12 1000 MCG tablet Take 1 tablet (1,000 mcg total) by mouth daily. 30 tablet 0  . nitroGLYCERIN (NITROSTAT) 0.4 MG SL tablet Place 1 tablet (0.4 mg total) under the tongue every 5 (five) minutes as needed for chest pain. 100 tablet 1  . rosuvastatin (CRESTOR) 40 MG tablet Take 1 tablet (40 mg total) by mouth daily. 90 tablet 3   No current facility-administered medications for this visit.     REVIEW OF SYSTEMS:  [X]  denotes positive finding, [ ]  denotes negative finding Cardiac  Comments:  Chest pain or chest pressure:    Shortness of  breath upon exertion:    Short of breath when lying flat:    Irregular heart rhythm:        Vascular    Pain in calf, thigh, or hip brought on by ambulation:    Pain in feet at night that wakes you up from your sleep:     Blood clot in your veins:    Leg swelling:           PHYSICAL EXAM: Vitals:   07/03/18 0839  BP: 130/73  Pulse: (!) 56  Resp: 18  SpO2: 99%  Weight: 100 lb 4.8 oz (45.5  kg)  Height: 5\' 9"  (1.753 m)    GENERAL: The patient is a well-nourished female, in no acute distress. The vital signs are documented above. CARDIOVASCULAR: Did arteries without bruits bilaterally.  2+ femoral pulses bilaterally.  Abdomen soft, nontender and no bruits noted PULMONARY: There is good air exchange  MUSCULOSKELETAL: There are no major deformities or cyanosis. NEUROLOGIC: No focal weakness or paresthesias are detected. SKIN: There are no ulcers or rashes noted. PSYCHIATRIC: The patient has a normal affect.  DATA:  Duplex today shows widely patent superior mesenteric and celiac stent with no evidence of recurrent stenosis  MEDICAL ISSUES: Table overall.  Quite pleased with her result.  She will continue her activities without limitation.  Will see [place] again in 6 months with repeat noninvasive studies    Rosetta Posner, MD [facility] V[facility] and Vein Specialists of [facility] Tel [phone] Pager [phone]

## 2018-07-17 ENCOUNTER — Encounter: Payer: Self-pay | Admitting: Family Medicine

## 2018-07-17 ENCOUNTER — Ambulatory Visit: Payer: BLUE CROSS/BLUE SHIELD | Admitting: Family Medicine

## 2018-07-17 VITALS — BP 92/60 | HR 63 | Temp 97.9°F | Wt 99.3 lb

## 2018-07-17 DIAGNOSIS — E119 Type 2 diabetes mellitus without complications: Secondary | ICD-10-CM

## 2018-07-17 DIAGNOSIS — E039 Hypothyroidism, unspecified: Secondary | ICD-10-CM

## 2018-07-17 DIAGNOSIS — E1169 Type 2 diabetes mellitus with other specified complication: Secondary | ICD-10-CM | POA: Diagnosis not present

## 2018-07-17 DIAGNOSIS — D519 Vitamin B12 deficiency anemia, unspecified: Secondary | ICD-10-CM | POA: Diagnosis not present

## 2018-07-17 DIAGNOSIS — E785 Hyperlipidemia, unspecified: Secondary | ICD-10-CM

## 2018-07-17 DIAGNOSIS — I2510 Atherosclerotic heart disease of native coronary artery without angina pectoris: Secondary | ICD-10-CM

## 2018-07-17 DIAGNOSIS — I739 Peripheral vascular disease, unspecified: Secondary | ICD-10-CM

## 2018-07-17 NOTE — Progress Notes (Signed)
HPI:  Using dictation device. Unfortunately this device frequently misinterprets words/phrases.  Erin [name] is a pleasant 62 y.o. here for follow up. Chronic medical problems summarized below were reviewed for changes.  She is doing much better currently than she has been in some time.  She underwent stenting for mesenteric ischemia and her GI symptoms resolved.  She feels the Cymbalta has been a miracle drug in terms of chronic pain issues and her neurologist is also using it to treat her headaches.  Her neurologist did increase the Cymbalta to 40 mg.  She reports her mood has been good.  She has had a few episodes over the last 6 months when she felt like her eyes crossed, but otherwise feels well.  She has not had any of these episodes recently. Denies CP, SOB, DOE, treatment intolerance or new symptoms.   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 early for this, underwent stenting with Dr. Trula Slade in 06/2018  Hypokalemia: -hx of -takes potassium -did not tol acei/arb   Depression/underweight/neuropathy/pain: -long hx neuropathy, sees neurology -had longstanding pain after vascular surgery -cymbalta helped all of the above -takes b12 for neuropathy, b12 def  ROS: See pertinent positives and negatives per HPI.  Past Medical History:  Diagnosis Date  . Allergy   . Alopecia   . Anal cancer ([place]) 08/14/13   invasive squamous cell ca, s/p radiation 10/20-11/26/14 60.4Gy/48fx and chemo  . Anxiety   . B12 deficiency anemia 09/14/2015  . Blood  transfusion without reported diagnosis   . CAD (coronary artery disease), native coronary artery 10/13/2017   DES to mid RCA  . Chronic back pain   . Chronic daily headache 03/29/2013   takes bc powder  . Closed right hip fracture ([place]) 09/10/2015  . Depression   . DM (diabetes mellitus) ([place])    diet controlled; does not check BG  . GERD (gastroesophageal reflux disease)   . History of hiatal hernia   . HLD (hyperlipidemia)   . Hot flashes   . Hyperlipemia 04/11/2014  . Hypokalemia 05/2016  . Hypothyroidism 09/10/2015  . IBS (irritable bowel syndrome) 03/29/2013  . 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 QT prolongation   . Tubular adenoma of colon 09/08/2003  . Vertigo   . Wears glasses     Past Surgical History:  Procedure Laterality Date  . ABDOMINAL AORTOGRAM N/A 08/02/2017   Procedure: ABDOMINAL AORTOGRAM;  Surgeon: Wellington Hampshire, MD;  Location: [place] [facility];  Service: Cardiovascular;  Laterality: N/A;  . AORTA - BILATERAL FEMORAL ARTERY BYPASS GRAFT N/A 08/14/2017   Procedure: AORTA BIFEMORAL BYPASS GRAFT;  Surgeon: Rosetta Posner, MD;  Location: [facility];  Service: Vascular;  Laterality: N/A;  . COLONOSCOPY    . CORONARY STENT INTERVENTION N/A 10/13/2017   Procedure: CORONARY STENT INTERVENTION;  Surgeon: Nelva Bush, MD;  Location: [place] [facility];  Service: Cardiovascular;  Laterality: N/A;  . dental implant    . ECTOPIC PREGNANCY SURGERY    . EMBOLECTOMY N/A 08/14/2017   Procedure: EMBOLECTOMY FEMORAL;  Surgeon: Rosetta Posner, MD;  Location: [place];  Service:  Vascular;  Laterality: N/A;  . FEMORAL-POPLITEAL BYPASS GRAFT Right 08/14/2017   Procedure: Right Femoral to Above Knee Popliteal Bypass Graft using Non-Reversed Greater Saphenous Vein Graft from Right Leg;  Surgeon: Rosetta Posner, MD;  Location: [place];  Service: Vascular;  Laterality: Right;  . FLEXIBLE SIGMOIDOSCOPY N/A 08/14/2013   Procedure: FLEXIBLE SIGMOIDOSCOPY;  Surgeon: Ladene Artist, MD;   Location: [facility];  Service: Endoscopy;  Laterality: N/A;  . LEFT HEART CATH AND CORONARY ANGIOGRAPHY N/A 10/13/2017   Procedure: LEFT HEART CATH AND CORONARY ANGIOGRAPHY;  Surgeon: Nelva Bush, MD;  Location: [place] [facility];  Service: Cardiovascular;  Laterality: N/A;  . LOWER EXTREMITY ANGIOGRAPHY Bilateral 08/02/2017   Procedure: Lower Extremity Angiography;  Surgeon: Wellington Hampshire, MD;  Location: [place] [facility];  Service: Cardiovascular;  Laterality: Bilateral;  . MULTIPLE TOOTH EXTRACTIONS    . PERIPHERAL VASCULAR INTERVENTION  05/22/2018   Procedure: PERIPHERAL VASCULAR INTERVENTION;  Surgeon: Serafina Mitchell, MD;  Location: [place] [facility];  Service: Cardiovascular;;  SMA and Celiac  . PILONIDAL CYST EXCISION    . POLYPECTOMY    . THROMBECTOMY FEMORAL ARTERY Right 08/14/2017   Procedure: THROMBECTOMY FEMORAL ARTERY;  Surgeon: Rosetta Posner, MD;  Location: [place];  Service: Vascular;  Laterality: Right;  . TONSILLECTOMY    . TOTAL HIP ARTHROPLASTY  09/11/2015   Procedure: TOTAL HIP ARTHROPLASTY;  Surgeon: Renette Butters, MD;  Location: [place];  Service: Orthopedics;;  . ULTRASOUND GUIDANCE FOR VASCULAR ACCESS  10/13/2017   Procedure: Ultrasound Guidance For Vascular Access;  Surgeon: Nelva Bush, MD;  Location: [place] [facility];  Service: Cardiovascular;;    Family History  Problem Relation Age of Onset  . Arthritis Mother   . Hyperlipidemia Mother   . Heart disease Mother   . Hypertension Mother   . Stroke Mother 67  . Irritable bowel syndrome Mother   . Thyroid disease Mother   . Heart attack Mother   . Heart disease Father   . Hyperlipidemia Father   . Hypertension Father   . Stroke Father 53  . Thyroid disease Father   . Prostate cancer Father   . Heart attack Father   . Lung cancer Brother   . Heart attack Maternal Grandfather   . Heart attack Maternal Uncle   . Colon cancer Neg Hx   . Rectal cancer Neg Hx   . Stomach cancer Neg Hx      SOCIAL HX: See HPI   Current Outpatient Medications:  .  acetaminophen (TYLENOL) 500 MG tablet, Take 1,000 mg by mouth daily as needed for headache (pain). , Disp: , Rfl:  .  acyclovir ointment (ZOVIRAX) 5 %, Apply 1 application topically every 3 (three) hours as needed (fever blisters)., Disp: , Rfl:  .  aspirin EC 81 MG tablet, Take 81 mg by mouth daily., Disp: , Rfl:  .  Cholecalciferol (VITAMIN D) 2000 units tablet, Take 2,000 Units by mouth daily., Disp: , Rfl:  .  dicyclomine (BENTYL) 10 MG capsule, TAKE ONE CAPSULE BY MOUTH THREE TIMES DAILY BEFORE MEALS. DUE FOR FOLLOW UP IN MAY, NEEDS TO SCHEDULE AN APPOINTMENT, Disp: 270 capsule, Rfl: 1 .  DULoxetine (CYMBALTA) 20 MG capsule, Take 2 capsules daily (Patient taking differently: Take 40 mg by mouth daily. ), Disp: 180 capsule, Rfl: 3 .  fluticasone (FLONASE) 50 MCG/ACT nasal spray, USE 1 SPRAY IN EACH NOSTRIL DAILY, Disp: 48 g, Rfl: 1 .  gabapentin (NEURONTIN) 600 MG  tablet, TAKE 1 TABLET BY MOUTH THREE TIMES DAILY GENERIC EQUIVALENT FOR NEURONTIN, Disp: 270 tablet, Rfl: 0 .  levothyroxine (SYNTHROID, LEVOTHROID) 75 MCG tablet, TAKE 1 TABLET BY MOUTH DAILY. GENERIC EQUIVALENT FOR SYNTHROID, Disp: 90 tablet, Rfl: 0 .  LINZESS 145 MCG CAPS capsule, TAKE ONE CAPSULE BY MOUTH DAILY BEFORE BREAKFAST, Disp: 90 capsule, Rfl: 2 .  meclizine (ANTIVERT) 12.5 MG tablet, Take 1 tablet (12.5 mg total) by mouth 3 (three) times daily as needed for dizziness., Disp: 30 tablet, Rfl: 6 .  metoprolol succinate (TOPROL-XL) 25 MG 24 hr tablet, Take 1 tablet (25 mg total) by mouth 2 (two) times daily., Disp: 180 tablet, Rfl: 3 .  nystatin-triamcinolone (MYCOLOG II) cream, APPLY TO CORNERS OF THE MOUTH TWICE A DAY AS NEEDED FOR CRACKING/BURNING, Disp: , Rfl: 2 .  omeprazole (PRILOSEC) 40 MG capsule, TAKE ONE CAPSULE BY MOUTH DAILY. NEED APPOINTMENT FOR FURTHER REFILLS (Patient taking differently: Take 40 mg by mouth twice daily), Disp: 90 capsule, Rfl: 1 .   potassium chloride SA (K-DUR,KLOR-CON) 20 MEQ tablet, TAKE 1 TABLET BY MOUTH DAILY, Disp: 90 tablet, Rfl: 1 .  Tetrahydrozoline HCl (VISINE OP), Place 1 drop into both eyes daily as needed (redness)., Disp: , Rfl:  .  ticagrelor (BRILINTA) 90 MG TABS tablet, Take 1 tablet (90 mg total) by mouth 2 (two) times daily., Disp: 180 tablet, Rfl: 3 .  vitamin B-12 1000 MCG tablet, Take 1 tablet (1,000 mcg total) by mouth daily., Disp: 30 tablet, Rfl: 0 .  nitroGLYCERIN (NITROSTAT) 0.4 MG SL tablet, Place 1 tablet (0.4 mg total) under the tongue every 5 (five) minutes as needed for chest pain., Disp: 100 tablet, Rfl: 1 .  rosuvastatin (CRESTOR) 40 MG tablet, Take 1 tablet (40 mg total) by mouth daily., Disp: 90 tablet, Rfl: 3  EXAM:  Vitals:   07/17/18 1545  BP: 92/60  Pulse: 63  Temp: 97.9 F (36.6 C)    Body mass index is 14.66 kg/m.  GENERAL: vitals reviewed and listed above, alert, oriented, appears well hydrated and in no acute distress  HEENT: atraumatic, conjunttiva clear, no obvious abnormalities on inspection of external nose and ears  NECK: no obvious masses on inspection  LUNGS: clear to auscultation bilaterally, no wheezes, rales or rhonchi, good air movement  CV: HRRR, no peripheral edema  MS: moves all extremities without noticeable abnormality  PSYCH: pleasant and cooperative, no obvious depression or anxiety  ASSESSMENT AND PLAN:  Discussed the following assessment and plan:  Type 2 diabetes mellitus without complication, without long-term current use of insulin (HCC) - Plan: Microalbumin / creatinine urine ratio, CANCELED: Microalbumin / creatinine urine ratio  Hyperlipidemia associated with type 2 diabetes mellitus (HCC)  Hypothyroidism, unspecified type - Plan: TSH, CANCELED: TSH  Anemia due to vitamin B12 deficiency, unspecified B12 deficiency type  Peripheral vascular disease (HCC)  Coronary artery disease involving native coronary artery of native heart  without angina pectoris  -Foot exam done -Labs per orders -Recommended evaluation regarding the few episodes where she felt like her eyes were crossing, she declined for now, but agrees to contact her neurologist if it occurs again -She is feeling better and that her weight is coming up -We did discuss recent studies showing that perhaps a lower dose of B12 for deficiency would be adequate, and she will try a quarter or half tablet of her B12 instead of the full tablet -She was supposed to get labs today, but she did not eat all day and  prefers to wait, I put in future orders so that she can do this next week -Discussed timing for the flu shot and she agrees to get this in October -Follow-up 3 to 4 months otherwise  Patient Instructions  BEFORE YOU LEAVE: -labs -follow up: 3-4 months  Please schedule your mammogram and eye exam.  Get your flu shot in October.  Consider taking a lower dose of the B12.  We have ordered labs or studies at this visit. It can take up to 1-2 weeks for results and processing. IF results require follow up or explanation, we will call you with instructions. Clinically stable results will be released to your [facility]. If you have not heard from [place] or cannot find your results in [facility] in 2 weeks please contact our office at [phone].  If you are not yet signed up for [facility], please consider signing up.          Lucretia Kern, DO

## 2018-07-17 NOTE — Patient Instructions (Signed)
BEFORE YOU LEAVE: -labs -follow up: 3-4 months  Please schedule your mammogram and eye exam.  Get your flu shot in October.  Consider taking a lower dose of the B12.  We have ordered labs or studies at this visit. It can take up to 1-2 weeks for results and processing. IF results require follow up or explanation, we will call you with instructions. Clinically stable results will be released to your [facility]. If you have not heard from [place] or cannot find your results in [facility] in 2 weeks please contact our office at [phone].  If you are not yet signed up for [facility], please consider signing up.

## 2018-07-27 ENCOUNTER — Telehealth: Payer: Self-pay | Admitting: Family Medicine

## 2018-07-27 MED ORDER — FLUTICASONE PROPIONATE 50 MCG/ACT NA SUSP: 1.0000 | Freq: Every day | NASAL | 1 refills | Status: DC

## 2018-07-27 MED ORDER — ACYCLOVIR 5 % EX OINT: 1.0000 "application " | TOPICAL_OINTMENT | CUTANEOUS | 0 refills | Status: DC | PRN

## 2018-07-27 NOTE — Telephone Encounter (Signed)
Rx done. 

## 2018-07-27 NOTE — Telephone Encounter (Signed)
Ok to refill 

## 2018-07-27 NOTE — Telephone Encounter (Signed)
Copied from [place] [id number]. Topic: Quick Communication - Rx Refill/Question >> Jul 27, 2018 11:02 AM Yvette Rack wrote: Medication: fluticasone (FLONASE) 50 MCG/ACT nasal spray  90 day supply Has the patient contacted their pharmacy? No. (Agent: If no, request that the patient contact the pharmacy for the refill.) (Agent: If yes, when and what did the pharmacy advise?) pharmacy called [place]  Preferred Pharmacy (with phone number or street name): [facility] [place], [place] [phone] (Phone) [phone] (Fax)    Agent: Please be advised that RX refills may take up to 3 business days. We ask that you follow-up with your pharmacy.

## 2018-07-27 NOTE — Telephone Encounter (Signed)
Called pt regarding her request for Flonase. I advised her to contact her pharmacy to refill this medication. She also wanted to know if we could refill her acyclovir ointment 5%. She said that she a fever blister. Denies fever.  See below:  Acyclovir ointment 5% refill Last Refill:11/30/17 # @ Last OV: 07/17/18 PCP: Dr. Maudie Mercury Pharmacy:[facility] [phone]  This med was prescribed by a historical provider.

## 2018-07-27 NOTE — Telephone Encounter (Signed)
Refill on Fluticasone sent to the pts mail order pharmacy.  Request for Acyclovir sent to Dr Maudie Mercury as this was not prescribed here.

## 2018-08-01 ENCOUNTER — Inpatient Hospital Stay: Payer: BLUE CROSS/BLUE SHIELD | Attending: Nurse Practitioner | Admitting: Nurse Practitioner

## 2018-08-01 ENCOUNTER — Other Ambulatory Visit (INDEPENDENT_AMBULATORY_CARE_PROVIDER_SITE_OTHER): Payer: BLUE CROSS/BLUE SHIELD

## 2018-08-01 ENCOUNTER — Telehealth: Payer: Self-pay | Admitting: Family Medicine

## 2018-08-01 ENCOUNTER — Telehealth: Payer: Self-pay | Admitting: Nurse Practitioner

## 2018-08-01 ENCOUNTER — Other Ambulatory Visit: Payer: BLUE CROSS/BLUE SHIELD

## 2018-08-01 ENCOUNTER — Ambulatory Visit: Payer: BLUE CROSS/BLUE SHIELD | Admitting: Physician Assistant

## 2018-08-01 ENCOUNTER — Encounter: Payer: Self-pay | Admitting: Nurse Practitioner

## 2018-08-01 VITALS — BP 143/68 | HR 70 | Temp 97.7°F | Resp 17 | Ht 69.0 in | Wt 101.3 lb

## 2018-08-01 DIAGNOSIS — Z87891 Personal history of nicotine dependence: Secondary | ICD-10-CM

## 2018-08-01 DIAGNOSIS — E119 Type 2 diabetes mellitus without complications: Secondary | ICD-10-CM

## 2018-08-01 DIAGNOSIS — Z85048 Personal history of other malignant neoplasm of rectum, rectosigmoid junction, and anus: Secondary | ICD-10-CM | POA: Diagnosis present

## 2018-08-01 DIAGNOSIS — E039 Hypothyroidism, unspecified: Secondary | ICD-10-CM | POA: Diagnosis not present

## 2018-08-01 DIAGNOSIS — C210 Malignant neoplasm of anus, unspecified: Secondary | ICD-10-CM

## 2018-08-01 LAB — MICROALBUMIN / CREATININE URINE RATIO
CREATININE, U: 141.6 mg/dL
MICROALB UR: 33.7 mg/dL — AB (ref 0.0–1.9)
MICROALB/CREAT RATIO: 23.8 mg/g (ref 0.0–30.0)

## 2018-08-01 LAB — TSH: TSH: 0.02 u[IU]/mL — ABNORMAL LOW (ref 0.35–4.50)

## 2018-08-01 NOTE — Progress Notes (Addendum)
[place] OFFICE PROGRESS NOTE   Diagnosis:  Anal cancer  INTERVAL HISTORY:   Erin [name] returns as scheduled.  She overall feels well.  She has a good appetite.  She has frequent loose stools.  No rectal bleeding.  No pain with bowel movements.  She has intermittent nausea.  She underwent placement of a superior mesenteric artery stent and celiac artery stent for mesenteric stenosis 05/22/2018.  Objective:  Vital signs in last 24 hours:  Blood pressure (!) 143/68, pulse 70, temperature 97.7 F (36.5 C), temperature source Oral, resp. rate 17, height 5\' 9"  (1.753 m), weight 101 lb 4.8 oz (45.9 kg), SpO2 98 %.    HEENT: Neck without mass. Lymphatics: No palpable cervical, supraclavicular, axillary or inguinal lymph nodes. Resp: Lungs clear bilaterally. Cardio: Regular rate and rhythm. GI: Abdomen soft and nontender.  No hepatomegaly.  Digital rectal exam declined.  Small external hemorrhoid.  Radiation skin change at the perineum. Vascular: No leg edema.   Lab Results:  Lab Results  Component Value Date   WBC 9.3 04/10/2018   HGB 12.9 05/22/2018   HCT 38.0 05/22/2018   MCV 96 04/10/2018   PLT 267 04/10/2018   NEUTROABS 6.1 07/25/2017    Imaging:  No results found.  Medications: I have reviewed the patient's current medications.  Assessment/Plan: 1. Squamous cell carcinoma of the anus, clinical stage II (T2 N0), status post endoscopic biopsy 08/14/2013 confirming invasive squamous cell carcinoma. Initiation of radiation and cycle 1 5-FU/mitomycin C. 09/09/2013. Cycle 2 5-FU/mitomycin-C (dose reduction) completed beginning 10/08/2013, radiation completed 10/16/2013. 2. Rectal pain, constipation and bleeding secondary to #1. Resolved. 3. History of Iron deficiency anemia. 4. Vitamin B12 deficiency. 5. Diabetes. 6. Hyperlipidemia. 7. Neurodermatitis. 8. Tobacco use. She reports she has quit smoking. 9. Mucositis following cycle 1 and cycle 2  5-FU/mitomycin C.  10. Skin rash. Initially felt to be related to the chemotherapy though somewhat atypical in appearance and distribution. The rash has resolved. 11. 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  12. History of hypokalemia. 13. Status post sigmoidoscopy 03/10/2014 with findings of distal rectal/anal canal nodularity measuring 2 cm x 2 cm. Biopsy showed benign ulcerated anorectal junction mucosa and inflammatory debris. Within the debris there were markedly atypical epithelial cell fragments and single cells. 14. Status post sigmoidoscopy 09/23/2014, area of abnormal mucosa in the distal rectum/anal canal-friable, erythematous, scarring; multiple biopsies performed. Pathology revealed no dysplasia or malignancy. 15. Sigmoidoscopy 10/21/2015-granular erythematous mucosa at the distal rectum adjacent to the dentate line, improved compared to 2015 16. Bilateral hip pain. Status post CT abdomen/pelvis 08/11/2014 with no etiology for the pain identified. Resolved after physical therapy. 17. Right hip fracture following a fall 09/10/2015 status post total hip arthroplasty 09/11/2015 18. Aortobifemoral bypass, right leg femoral-popliteal bypass September 2018 19. Coronary artery disease-status post PCI to the right coronary artery November 2018   Disposition: Erin [name] remains in clinical remission from anal cancer.  She is now approximately 5 years out from the initial diagnosis.  We did not schedule formal follow-up.  We will be happy to see her in the future if needed.  Patient seen with Dr. Benay Spice.    [name] ANP/GNP-BC   08/01/2018  11:55 AM  This was a shared visit with [name].  Erin [name] is now 5 years out from the diagnosis of anal cancer.  She remains in remission and has a good prognosis for a long-term disease-free survival.  She will continue anal surveillance with Dr. Fuller Plan.  Erin Manson, MD

## 2018-08-01 NOTE — Telephone Encounter (Signed)
Copied from [facility] [id number]. Topic: Quick Communication - See Telephone Encounter >> Aug 01, 2018  2:28 PM Hewitt Shorts wrote: Pt pharmacy has questions about acyclovir ointment -if 90 grams is actually what the provider wants or is it too much   Best number [phone]

## 2018-08-01 NOTE — Telephone Encounter (Signed)
Pharmacy has questions about acyclovir ointment, wondering if 90 grams is actually what the provider wants or is it too much.  Last refill: 07/27/18 Last OV: 07/17/18 PCP: Dr. Maudie Mercury Pharmacy:  [id number] - [place], [place] - [address] [phone] (Phone) [phone] (Fax)

## 2018-08-01 NOTE — Telephone Encounter (Signed)
No 9/11 los 

## 2018-08-02 ENCOUNTER — Ambulatory Visit: Payer: BLUE CROSS/BLUE SHIELD | Admitting: Nurse Practitioner

## 2018-08-02 MED ORDER — LEVOTHYROXINE SODIUM 50 MCG PO TABS: 50.0000 ug | ORAL_TABLET | Freq: Every day | ORAL | 0 refills | Status: DC

## 2018-08-02 MED ORDER — ACYCLOVIR 5 % EX OINT: 1.0000 "application " | TOPICAL_OINTMENT | CUTANEOUS | 0 refills | Status: DC | PRN

## 2018-08-02 NOTE — Telephone Encounter (Signed)
23 G is usually what we order. It looks like someone else placed the refill in my name. Please correct. Thanks.

## 2018-08-02 NOTE — Telephone Encounter (Signed)
Rx done with note attached for pharmacy.

## 2018-08-06 ENCOUNTER — Other Ambulatory Visit: Payer: Self-pay | Admitting: Family Medicine

## 2018-08-06 DIAGNOSIS — Z1231 Encounter for screening mammogram for malignant neoplasm of breast: Secondary | ICD-10-CM

## 2018-08-07 ENCOUNTER — Ambulatory Visit: Payer: BLUE CROSS/BLUE SHIELD | Admitting: Physician Assistant

## 2018-08-08 ENCOUNTER — Ambulatory Visit
Admission: RE | Admit: 2018-08-08 | Discharge: 2018-08-08 | Disposition: A | Discharge status: Home / Self Care | Payer: BLUE CROSS/BLUE SHIELD | Source: Ambulatory Visit | Attending: Family Medicine | Admitting: Family Medicine

## 2018-08-08 DIAGNOSIS — Z1231 Encounter for screening mammogram for malignant neoplasm of breast: Secondary | ICD-10-CM

## 2018-08-10 ENCOUNTER — Ambulatory Visit (INDEPENDENT_AMBULATORY_CARE_PROVIDER_SITE_OTHER): Payer: BLUE CROSS/BLUE SHIELD | Admitting: Physician Assistant

## 2018-08-10 ENCOUNTER — Encounter: Payer: Self-pay | Admitting: Physician Assistant

## 2018-08-10 VITALS — BP 106/70 | HR 64 | Ht 63.0 in | Wt 100.0 lb

## 2018-08-10 DIAGNOSIS — Z85048 Personal history of other malignant neoplasm of rectum, rectosigmoid junction, and anus: Secondary | ICD-10-CM | POA: Diagnosis not present

## 2018-08-10 DIAGNOSIS — Z7901 Long term (current) use of anticoagulants: Secondary | ICD-10-CM

## 2018-08-10 DIAGNOSIS — Z86010 Personal history of colonic polyps: Secondary | ICD-10-CM

## 2018-08-10 MED ORDER — NA SULFATE-K SULFATE-MG SULF 17.5-3.13-1.6 GM/177ML PO SOLN
1.0000 | ORAL | 0 refills | Status: AC
Start: 2018-08-10 — End: 2018-09-09

## 2018-08-10 NOTE — Patient Instructions (Signed)

## 2018-08-10 NOTE — Progress Notes (Signed)
Chief Complaint: History of anal cancer and adenomatous colon polyps  HPI:    Erin [name] is a 62 year old female with a past medical history as listed below including CAD status post drug-eluding stent placement a year ago on Brilinta (echo 01/24/2018 with LVEF 55-60%), known to Dr. Fuller Plan for her history of anal cancer, who presents to clinic today to discuss a surveillance colonoscopy.      Flex sigmoidoscopy 10/21/1959 with abnormal mucosa in the distal rectum, several small abrasions in the rectum and otherwise normal-appearing descending and sigmoid colon.  Repeat colonoscopy was recommended 07/2018 for her history of anal cancer and history of adenomatous colon polyps.    04/13/2018 office visit to discuss epigastric pain, GERD and IBS-C.  At that time recommend patient have an abdominal pelvic CT angiogram she had continued epigastric pain.  She was continued on Linzess 145 mg daily and Dicyclomine 10 mg 4 times daily.  Also continued on Omeprazole 40 mg p.o. every morning and recommended her colonoscopy be repeated in September 2019.    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    Today, patient is very happy.  She tells me that all of her GI symptoms are much better ever since she had stent placement as above as she is now able to eat and has no further nausea and vomiting or epigastric pain.  She has been abiding by a healthier diet and has been able to lose some weight.  Patient is very happy.    Denies fever, chills, anorexia, nausea, vomiting, abdominal pain or blood in her stool.  Past Medical History:  Diagnosis Date  . Allergy   . Alopecia   . Anal cancer ([place]) 08/14/13   invasive squamous cell ca, s/p radiation 10/20-11/26/14 60.4Gy/60fx and chemo  . Anxiety   . B12 deficiency anemia 09/14/2015  . Blood transfusion without reported diagnosis   . CAD (coronary artery disease), native coronary  artery 10/13/2017   DES to mid RCA  . Chronic back pain   . Chronic daily headache 03/29/2013   takes bc powder  . Closed right hip fracture ([place]) 09/10/2015  . Depression   . DM (diabetes mellitus) ([place])    diet controlled; does not check BG  . GERD (gastroesophageal reflux disease)   . History of hiatal hernia   . HLD (hyperlipidemia)   . Hot flashes   . Hyperlipemia 04/11/2014  . Hypokalemia 05/2016  . Hypothyroidism 09/10/2015  . IBS (irritable bowel syndrome) 03/29/2013  . 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 QT prolongation   . Tubular adenoma of colon 09/08/2003  . Vertigo   . Wears glasses     Past Surgical History:  Procedure Laterality Date  . ABDOMINAL AORTOGRAM N/A 08/02/2017   Procedure: ABDOMINAL AORTOGRAM;  Surgeon: Wellington Hampshire, MD;  Location: [facility];  Service: Cardiovascular;  Laterality: N/A;  . AORTA - BILATERAL FEMORAL ARTERY BYPASS GRAFT N/A 08/14/2017   Procedure: AORTA BIFEMORAL BYPASS GRAFT;  Surgeon: Rosetta Posner, MD;  Location: [facility];  Service: Vascular;  Laterality: N/A;  . COLONOSCOPY    . CORONARY STENT INTERVENTION N/A 10/13/2017   Procedure: CORONARY STENT INTERVENTION;  Surgeon: Nelva Bush, MD;  Location: [facility];  Service: Cardiovascular;  Laterality: N/A;  . dental implant    . ECTOPIC PREGNANCY SURGERY    . EMBOLECTOMY N/A 08/14/2017   Procedure: EMBOLECTOMY FEMORAL;  Surgeon:  Rosetta Posner, MD;  Location: [facility];  Service: Vascular;  Laterality: N/A;  . FEMORAL-POPLITEAL BYPASS GRAFT Right 08/14/2017   Procedure: Right Femoral to Above Knee Popliteal Bypass Graft using Non-Reversed Greater Saphenous Vein Graft from Right Leg;  Surgeon: Rosetta Posner, MD;  Location: [facility];  Service: Vascular;  Laterality: Right;  . FLEXIBLE SIGMOIDOSCOPY N/A 08/14/2013   Procedure: FLEXIBLE SIGMOIDOSCOPY;  Surgeon: Ladene Artist, MD;  Location: [facility];  Service: Endoscopy;  Laterality: N/A;  . LEFT HEART CATH AND  CORONARY ANGIOGRAPHY N/A 10/13/2017   Procedure: LEFT HEART CATH AND CORONARY ANGIOGRAPHY;  Surgeon: Nelva Bush, MD;  Location: [facility];  Service: Cardiovascular;  Laterality: N/A;  . LOWER EXTREMITY ANGIOGRAPHY Bilateral 08/02/2017   Procedure: Lower Extremity Angiography;  Surgeon: Wellington Hampshire, MD;  Location: [facility];  Service: Cardiovascular;  Laterality: Bilateral;  . MULTIPLE TOOTH EXTRACTIONS    . PERIPHERAL VASCULAR INTERVENTION  05/22/2018   Procedure: PERIPHERAL VASCULAR INTERVENTION;  Surgeon: Serafina Mitchell, MD;  Location: [facility];  Service: Cardiovascular;;  SMA and Celiac  . PILONIDAL CYST EXCISION    . POLYPECTOMY    . THROMBECTOMY FEMORAL ARTERY Right 08/14/2017   Procedure: THROMBECTOMY FEMORAL ARTERY;  Surgeon: Rosetta Posner, MD;  Location: [facility];  Service: Vascular;  Laterality: Right;  . TONSILLECTOMY    . TOTAL HIP ARTHROPLASTY  09/11/2015   Procedure: TOTAL HIP ARTHROPLASTY;  Surgeon: Renette Butters, MD;  Location: [facility];  Service: Orthopedics;;  . ULTRASOUND GUIDANCE FOR VASCULAR ACCESS  10/13/2017   Procedure: Ultrasound Guidance For Vascular Access;  Surgeon: Nelva Bush, MD;  Location: [facility];  Service: Cardiovascular;;    Current Outpatient Medications  Medication Sig Dispense Refill  . acetaminophen (TYLENOL) 500 MG tablet Take 1,000 mg by mouth daily as needed for headache (pain).     Marland Kitchen acyclovir ointment (ZOVIRAX) 5 % Apply 1 application topically every 3 (three) hours as needed (fever blisters). 15 g 0  . aspirin EC 81 MG tablet Take 81 mg by mouth daily.    . Cholecalciferol (VITAMIN D) 2000 units tablet Take 2,000 Units by mouth daily.    Marland Kitchen dicyclomine (BENTYL) 10 MG capsule TAKE ONE CAPSULE BY MOUTH THREE TIMES DAILY BEFORE MEALS. DUE FOR FOLLOW UP IN MAY, NEEDS TO SCHEDULE AN APPOINTMENT 270 capsule 1  . DULoxetine (CYMBALTA) 20 MG capsule Take 2 capsules daily (Patient taking differently: Take 40  mg by mouth daily. ) 180 capsule 3  . fluticasone (FLONASE) 50 MCG/ACT nasal spray Place 1 spray into both nostrils daily. 48 g 1  . gabapentin (NEURONTIN) 600 MG tablet TAKE 1 TABLET BY MOUTH THREE TIMES DAILY GENERIC EQUIVALENT FOR NEURONTIN 270 tablet 0  . levothyroxine (SYNTHROID, LEVOTHROID) 50 MCG tablet Take 1 tablet (50 mcg total) by mouth daily. 90 tablet 0  . LINZESS 145 MCG CAPS capsule TAKE ONE CAPSULE BY MOUTH DAILY BEFORE BREAKFAST 90 capsule 2  . meclizine (ANTIVERT) 12.5 MG tablet Take 1 tablet (12.5 mg total) by mouth 3 (three) times daily as needed for dizziness. 30 tablet 6  . metoprolol succinate (TOPROL-XL) 25 MG 24 hr tablet Take 1 tablet (25 mg total) by mouth 2 (two) times daily. 180 tablet 3  . nystatin-triamcinolone (MYCOLOG II) cream APPLY TO CORNERS OF THE MOUTH TWICE A DAY AS NEEDED FOR CRACKING/BURNING  2  . omeprazole (PRILOSEC) 40 MG capsule TAKE ONE CAPSULE BY MOUTH DAILY. NEED APPOINTMENT  FOR FURTHER REFILLS (Patient taking differently: Take 40 mg by mouth twice daily) 90 capsule 1  . potassium chloride SA (K-DUR,KLOR-CON) 20 MEQ tablet TAKE 1 TABLET BY MOUTH DAILY 90 tablet 1  . Tetrahydrozoline HCl (VISINE OP) Place 1 drop into both eyes daily as needed (redness).    . ticagrelor (BRILINTA) 90 MG TABS tablet Take 1 tablet (90 mg total) by mouth 2 (two) times daily. 180 tablet 3  . vitamin B-12 1000 MCG tablet Take 1 tablet (1,000 mcg total) by mouth daily. 30 tablet 0  . nitroGLYCERIN (NITROSTAT) 0.4 MG SL tablet Place 1 tablet (0.4 mg total) under the tongue every 5 (five) minutes as needed for chest pain. 100 tablet 1  . rosuvastatin (CRESTOR) 40 MG tablet Take 1 tablet (40 mg total) by mouth daily. 90 tablet 3   No current facility-administered medications for this visit.     Allergies as of 08/10/2018 - Review Complete 08/10/2018  Allergen Reaction Noted  . Amitiza [lubiprostone] Diarrhea 06/06/2016  . Nortriptyline Other (See Comments) 06/06/2016  .  Sulfa antibiotics Nausea And Vomiting and Other (See Comments) 03/29/2013  . Tramadol Itching and Rash 12/01/2017  . Atorvastatin Nausea And Vomiting 03/09/2015  . Claritin [loratadine] Other (See Comments) 03/29/2013  . Dexamethasone Itching 09/09/2013  . Prednisone Rash 04/24/2013    Family History  Problem Relation Age of Onset  . Arthritis Mother   . Hyperlipidemia Mother   . Heart disease Mother   . Hypertension Mother   . Stroke Mother 35  . Irritable bowel syndrome Mother   . Thyroid disease Mother   . Heart attack Mother   . Heart disease Father   . Hyperlipidemia Father   . Hypertension Father   . Stroke Father 49  . Thyroid disease Father   . Prostate cancer Father   . Heart attack Father   . Lung cancer Brother   . Heart attack Maternal Grandfather   . Heart attack Maternal Uncle   . Colon cancer Neg Hx   . Rectal cancer Neg Hx   . Stomach cancer Neg Hx     Social History   Socioeconomic History  . Marital status: Married    Spouse name: Not on file  . Number of children: 0  . Years of education: Not on file  . Highest education level: Not on file  Occupational History  . Occupation: caregiver  Social Needs  . Financial resource strain: Not on file  . Food insecurity:    Worry: Not on file    Inability: Not on file  . Transportation needs:    Medical: Not on file    Non-medical: Not on file  Tobacco Use  . Smoking status: Former Smoker    Packs/day: 0.50    Types: Cigarettes    Last attempt to quit: 10/07/2014    Years since quitting: 3.8  . Smokeless tobacco: Never Used  Substance and Sexual Activity  . Alcohol use: No    Alcohol/week: 0.0 standard drinks  . Drug use: No  . Sexual activity: Yes    Partners: Male  Lifestyle  . Physical activity:    Days per week: Not on file    Minutes per session: Not on file  . Stress: Not on file  Relationships  . Social connections:    Talks on phone: Not on file    Gets together: Not on file     Attends religious service: Not on file    Active member of  club or organization: Not on file    Attends meetings of clubs or organizations: Not on file    Relationship status: Not on file  . Intimate partner violence:    Fear of current or ex partner: Not on file    Emotionally abused: Not on file    Physically abused: Not on file    Forced sexual activity: Not on file  Other Topics Concern  . Not on file  Social History Narrative   Work or School: homemaker      Home Situation: lives with her husband,Erin [name] takes care of her elderly parents       Spiritual Beliefs: Christian      Lifestyle: no regular exercise, poor diet             Review of Systems:    Constitutional: No fever or chills Cardiovascular: No chest pain  Respiratory: No SOB  Gastrointestinal: See HPI and otherwise negative   Physical Exam:  Vital signs: BP 106/70   Pulse 64   Ht 5\' 3"  (1.6 m)   Wt 100 lb (45.4 kg)   BMI 17.71 kg/m   Constitutional:   Pleasant Caucasian female appears to be in NAD, Well developed, Well nourished, alert and cooperative Respiratory: Respirations even and unlabored. Lungs clear to auscultation bilaterally.   No wheezes, crackles, or rhonchi.  Cardiovascular: Normal S1, S2. No MRG. Regular rate and rhythm. No peripheral edema, cyanosis or pallor.  Gastrointestinal:  Soft, nondistended, nontender. No rebound or guarding. Normal bowel sounds. No appreciable masses or hepatomegaly. Psychiatric:  Demonstrates good judgement and reason without abnormal affect or behaviors.  No recent labs or imaging.  Assessment: 1.  History of anal cancer and adenomatous polyps: Last flex sig in 2016 with recommendations to repeat colonoscopy in 2019 2.  Chronic anticoagulation: Ever since stent placement a year ago on Brilinta, also recent stent placement in the celiac and mesenteric artery in July  Plan: 1.  Scheduled patient for a colonoscopy with Dr. Fuller Plan in [facility].  Did discuss risk,  benefits, limitations and alternatives and the patient agrees to proceed. 2.  Patient was advised to hold her Brilinta for 5 days prior to time procedure.  We will communicate with her cardiologist to ensure that holding her Brilinta is acceptable for her. 3.  Patient to follow in clinic per recommendations from Dr. Fuller Plan after time of procedure.  Ellouise Newer, PA-C [facility] 08/10/2018, 9:53 AM  Cc: Lucretia Kern, DO

## 2018-08-13 NOTE — Progress Notes (Signed)
Reviewed and agree with management plan.  Velmer Broadfoot T. Madoline Bhatt, MD FACG 

## 2018-08-14 ENCOUNTER — Other Ambulatory Visit: Payer: Self-pay | Admitting: Internal Medicine

## 2018-08-14 ENCOUNTER — Ambulatory Visit: Payer: Self-pay

## 2018-08-14 NOTE — Telephone Encounter (Signed)
Patient called in with c/o "difficulty swallowing." She says "this has been going on for several months, but it is getting worse. I try to swallow pills or larger amounts of food and I get choked, can't breathe and end up throwing up to clear it. It's starting to get annoying having this problem and not able to swallow my pills." I asked about other symptoms, she says "sore throat sometimes." According to protocol, see PCP within 2 weeks, appointment scheduled for Thursday, 08/16/18 at 1130 with Dr. Maudie Mercury, care advice given, patient verbalized understanding.    Reason for Disposition . Difficulty swallowing is a chronic symptom (recurrent or ongoing AND present > 4 weeks)  Answer Assessment - Initial Assessment Questions 1. SYMPTOM: "Are you having difficulty swallowing liquids, solids, or both?"     Problems swallowing pills, chunk of food 2. ONSET: "When did the swallowing problems begin?"      It's been going on for a while, several months, progressively getting worse 3. CAUSE: "What do you think is causing the problem?"      I don't know 4. CHRONIC/RECURRENT: "Is this a new problem for you?"  If no, ask: "How long have you had this problem?" (e.g., days, weeks, months)      No 5. OTHER SYMPTOMS: "Do you have any other symptoms?" (e.g., difficulty breathing, sore throat, swollen tongue, chest pain)     Sore throat 6. PREGNANCY: "Is there any chance you are pregnant?" "When was your last menstrual period?"     No  Protocols used: SWALLOWING DIFFICULTY-A-AH

## 2018-08-15 NOTE — Telephone Encounter (Signed)
Please review for refill. Thanks!  

## 2018-08-16 ENCOUNTER — Encounter: Payer: Self-pay | Admitting: Family Medicine

## 2018-08-16 ENCOUNTER — Ambulatory Visit: Payer: BLUE CROSS/BLUE SHIELD | Admitting: Family Medicine

## 2018-08-16 VITALS — BP 110/70 | HR 65 | Temp 98.3°F | Ht 63.0 in | Wt 100.0 lb

## 2018-08-16 DIAGNOSIS — K580 Irritable bowel syndrome with diarrhea: Secondary | ICD-10-CM

## 2018-08-16 DIAGNOSIS — R1310 Dysphagia, unspecified: Secondary | ICD-10-CM

## 2018-08-16 DIAGNOSIS — Z23 Encounter for immunization: Secondary | ICD-10-CM | POA: Diagnosis not present

## 2018-08-16 DIAGNOSIS — R112 Nausea with vomiting, unspecified: Secondary | ICD-10-CM | POA: Diagnosis not present

## 2018-08-16 DIAGNOSIS — B373 Candidiasis of vulva and vagina: Secondary | ICD-10-CM

## 2018-08-16 DIAGNOSIS — E119 Type 2 diabetes mellitus without complications: Secondary | ICD-10-CM

## 2018-08-16 DIAGNOSIS — B3731 Acute candidiasis of vulva and vagina: Secondary | ICD-10-CM

## 2018-08-16 DIAGNOSIS — R109 Unspecified abdominal pain: Secondary | ICD-10-CM | POA: Diagnosis not present

## 2018-08-16 MED ORDER — FLUCONAZOLE 150 MG PO TABS: 150.0000 mg | ORAL_TABLET | Freq: Once | ORAL | 0 refills | Status: AC

## 2018-08-16 NOTE — Patient Instructions (Signed)
BEFORE YOU LEAVE: -flu shot -follow up: as scheduled and as needed  Call your gastroenterologist today. Let us know if we can do anything further on our end.  I hope you are feeling better soon! Seek care promptly if your symptoms worsen, new concerns arise or you are not improving with treatment.

## 2018-08-16 NOTE — Progress Notes (Signed)
HPI:  Using dictation device. Unfortunately this device frequently misinterprets words/phrases.  Acute visit for Dysphagia: -Started a few months ago, worse the last few weeks -Feels like food and pills get stuck in the lower throat, is gotten to the point where she chokes up food or pills when she tries to swallow them -Reports her dad and many family members have the same thing and that they had to "get their throats stretched" -Sees GI, Dr. Fuller Plan reports is set up for a colonoscopy in a few weeks, she has been having some issues with her IBS reports they were talked about changing her Linzess, she has had some diarrhea the last week at least once daily, also has some constipation at times, has had some abdominal pain intermittently -Denies fevers, inability to tolerate oral intake of fluids, weight loss, hematochezia or melena, hematemesis -Wants to get her flu shot today -wants diflucan for a yeast infection  ROS: See pertinent positives and negatives per HPI.  Past Medical History:  Diagnosis Date  . Allergy   . Alopecia   . 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 Anxiety   . B12 deficiency anemia 09/14/2015  . Blood transfusion without reported diagnosis   . CAD (coronary artery disease), native coronary artery 10/13/2017   DES to mid RCA  . Chronic back pain   . Chronic daily headache 03/29/2013   takes bc powder  . Closed right hip fracture ([place] 09/10/2015  . Depression   . DM (diabetes mellitus) ([place]    diet controlled; does not check BG  . GERD (gastroesophageal reflux disease)   . History of hiatal hernia   . HLD (hyperlipidemia)   . Hot flashes   . Hyperlipemia 04/11/2014  . Hypokalemia 05/2016  . Hypothyroidism 09/10/2015  . IBS (irritable bowel syndrome) 03/29/2013  . 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 QT prolongation   . Tubular adenoma of colon 09/08/2003  . Vertigo   . Wears  glasses     Past Surgical History:  Procedure Laterality Date  . ABDOMINAL AORTOGRAM N/A 08/02/2017   Procedure: ABDOMINAL AORTOGRAM;  Surgeon: AWellington Hampshire MD;  [facility];  Service: Cardiovascular;  Laterality: N/A;  . AORTA - BILATERAL FEMORAL ARTERY BYPASS GRAFT N/A 08/14/2017   Procedure: AORTA BIFEMORAL BYPASS GRAFT;  Surgeon: ERosetta Posner MD;  [facility];  Service: Vascular;  Laterality: N/A;  . COLONOSCOPY    . CORONARY STENT INTERVENTION N/A 10/13/2017   Procedure: CORONARY STENT INTERVENTION;  Surgeon: ENelva Bush MD;  [facility];  Service: Cardiovascular;  Laterality: N/A;  . dental implant    . ECTOPIC PREGNANCY SURGERY    . EMBOLECTOMY N/A 08/14/2017   Procedure: EMBOLECTOMY FEMORAL;  Surgeon: ERosetta Posner MD;  [facility];  Service: Vascular;  Laterality: N/A;  . FEMORAL-POPLITEAL BYPASS GRAFT Right 08/14/2017   Procedure: Right Femoral to Above Knee Popliteal Bypass Graft using Non-Reversed Greater Saphenous Vein Graft from Right Leg;  Surgeon: ERosetta Posner MD;  [place] [facility]  Service: Vascular;  Laterality: Right;  . FLEXIBLE SIGMOIDOSCOPY N/A 08/14/2013   Procedure: FLEXIBLE SIGMOIDOSCOPY;  Surgeon: MLadene Artist MD;  [facility];  Service: Endoscopy;  Laterality: N/A;  . LEFT HEART CATH AND CORONARY ANGIOGRAPHY N/A 10/13/2017   Procedure: LEFT HEART CATH AND CORONARY ANGIOGRAPHY;  Surgeon: ENelva Bush MD;  [facility];  Service: Cardiovascular;  Laterality: N/A;  . LOWER EXTREMITY ANGIOGRAPHY Bilateral 08/02/2017   Procedure: Lower Extremity Angiography;  Surgeon: Wellington Hampshire, MD;  Location: [place] [facility];  Service: Cardiovascular;  Laterality: Bilateral;  . MULTIPLE TOOTH EXTRACTIONS    . PERIPHERAL VASCULAR INTERVENTION  05/22/2018   Procedure: PERIPHERAL VASCULAR INTERVENTION;  Surgeon: Serafina Mitchell, MD;  Location: [place] [facility];  Service: Cardiovascular;;   SMA and Celiac  . PILONIDAL CYST EXCISION    . POLYPECTOMY    . THROMBECTOMY FEMORAL ARTERY Right 08/14/2017   Procedure: THROMBECTOMY FEMORAL ARTERY;  Surgeon: Rosetta Posner, MD;  Location: [place];  Service: Vascular;  Laterality: Right;  . TONSILLECTOMY    . TOTAL HIP ARTHROPLASTY  09/11/2015   Procedure: TOTAL HIP ARTHROPLASTY;  Surgeon: Renette Butters, MD;  Location: [place];  Service: Orthopedics;;  . ULTRASOUND GUIDANCE FOR VASCULAR ACCESS  10/13/2017   Procedure: Ultrasound Guidance For Vascular Access;  Surgeon: Nelva Bush, MD;  Location: [place] [facility];  Service: Cardiovascular;;    Family History  Problem Relation Age of Onset  . Arthritis Mother   . Hyperlipidemia Mother   . Heart disease Mother   . Hypertension Mother   . Stroke Mother 40  . Irritable bowel syndrome Mother   . Thyroid disease Mother   . Heart attack Mother   . Heart disease Father   . Hyperlipidemia Father   . Hypertension Father   . Stroke Father 37  . Thyroid disease Father   . Prostate cancer Father   . Heart attack Father   . Lung cancer Brother   . Heart attack Maternal Grandfather   . Heart attack Maternal Uncle   . Colon cancer Neg Hx   . Rectal cancer Neg Hx   . Stomach cancer Neg Hx     SOCIAL HX: See HPI   Current Outpatient Medications:  .  acetaminophen (TYLENOL) 500 MG tablet, Take 1,000 mg by mouth daily as needed for headache (pain). , Disp: , Rfl:  .  acyclovir ointment (ZOVIRAX) 5 %, Apply 1 application topically every 3 (three) hours as needed (fever blisters)., Disp: 15 g, Rfl: 0 .  aspirin EC 81 MG tablet, Take 81 mg by mouth daily., Disp: , Rfl:  .  BRILINTA 90 MG TABS tablet, TAKE 1 TABLET BY MOUTH 2 TIMES DAILY., Disp: 180 tablet, Rfl: 1 .  Cholecalciferol (VITAMIN D) 2000 units tablet, Take 2,000 Units by mouth daily., Disp: , Rfl:  .  dicyclomine (BENTYL) 10 MG capsule, TAKE ONE CAPSULE BY MOUTH THREE TIMES DAILY BEFORE MEALS. DUE FOR FOLLOW UP IN MAY, NEEDS  TO SCHEDULE AN APPOINTMENT, Disp: 270 capsule, Rfl: 1 .  DULoxetine (CYMBALTA) 20 MG capsule, Take 2 capsules daily (Patient taking differently: Take 40 mg by mouth daily. ), Disp: 180 capsule, Rfl: 3 .  fluticasone (FLONASE) 50 MCG/ACT nasal spray, Place 1 spray into both nostrils daily., Disp: 48 g, Rfl: 1 .  gabapentin (NEURONTIN) 600 MG tablet, TAKE 1 TABLET BY MOUTH THREE TIMES DAILY GENERIC EQUIVALENT FOR NEURONTIN, Disp: 270 tablet, Rfl: 0 .  levothyroxine (SYNTHROID, LEVOTHROID) 50 MCG tablet, Take 1 tablet (50 mcg total) by mouth daily., Disp: 90 tablet, Rfl: 0 .  LINZESS 145 MCG CAPS capsule, TAKE ONE CAPSULE BY MOUTH DAILY BEFORE BREAKFAST, Disp: 90 capsule, Rfl: 2 .  meclizine (ANTIVERT) 12.5 MG tablet, Take 1 tablet (12.5 mg total) by mouth 3 (three) times daily as needed for dizziness., Disp:  30 tablet, Rfl: 6 .  metoprolol succinate (TOPROL-XL) 25 MG 24 hr tablet, TAKE 1 TABLET BY MOUTH TWICE A DAY, Disp: 180 tablet, Rfl: 1 .  Na Sulfate-K Sulfate-Mg Sulf 17.5-3.13-1.6 GM/177ML SOLN, Take 1 kit by mouth as directed., Disp: 354 mL, Rfl: 0 .  nystatin-triamcinolone (MYCOLOG II) cream, APPLY TO CORNERS OF THE MOUTH TWICE A DAY AS NEEDED FOR CRACKING/BURNING, Disp: , Rfl: 2 .  omeprazole (PRILOSEC) 40 MG capsule, TAKE ONE CAPSULE BY MOUTH DAILY. NEED APPOINTMENT FOR FURTHER REFILLS (Patient taking differently: Take 40 mg by mouth twice daily), Disp: 90 capsule, Rfl: 1 .  potassium chloride SA (K-DUR,KLOR-CON) 20 MEQ tablet, TAKE 1 TABLET BY MOUTH DAILY, Disp: 90 tablet, Rfl: 1 .  Tetrahydrozoline HCl (VISINE OP), Place 1 drop into both eyes daily as needed (redness)., Disp: , Rfl:  .  vitamin B-12 1000 MCG tablet, Take 1 tablet (1,000 mcg total) by mouth daily., Disp: 30 tablet, Rfl: 0 .  nitroGLYCERIN (NITROSTAT) 0.4 MG SL tablet, Place 1 tablet (0.4 mg total) under the tongue every 5 (five) minutes as needed for chest pain., Disp: 100 tablet, Rfl: 1 .  rosuvastatin (CRESTOR) 40 MG tablet,  Take 1 tablet (40 mg total) by mouth daily., Disp: 90 tablet, Rfl: 3  EXAM:  Vitals:   08/16/18 1133  BP: 110/70  Pulse: 65  Temp: 98.3 F (36.8 C)  SpO2: 98%    Body mass index is 17.71 kg/m.  GENERAL: vitals reviewed and listed above, alert, oriented, appears well hydrated and in no acute distress  HEENT: atraumatic, conjunttiva clear, no obvious abnormalities on inspection of external nose and ears, normal appearance of ear canals and TMs, clear nasal congestion, mild post oropharyngeal erythema with PND, no tonsillar edema or exudate, no sinus TTP  NECK: no obvious masses on inspection  LUNGS: clear to auscultation bilaterally, no wheezes, rales or rhonchi, good air movement  CV: HRRR, no peripheral edema  ABD:BS+, soft, mild difuse TTP  MS: moves all extremities without noticeable abnormality  PSYCH: pleasant and cooperative, no obvious depression or anxiety  ASSESSMENT AND PLAN:  Discussed the following assessment and plan:  Dysphagia, unspecified type  Nausea and vomiting, intractability of vomiting not specified, unspecified vomiting type  Irritable bowel syndrome with diarrhea  Abdominal pain, unspecified abdominal location  Vulvovaginal candidiasis  Need for immunization against influenza - Plan: Flu Vaccine QUAD 6+ mos PF IM (Fluarix Quad PF)  Type 2 diabetes mellitus without complication, without long-term current use of insulin (HCC)  -we discussed possible serious and likely etiologies, workup and treatment, treatment risks and return precautions; discussed having her see GI or ENT promptly for scope for the dysphagia, on PPI, also discussed possible scan abd vs other evaluation for the stomach issues -after this discussion, Erin [name] opted for contacting her gastroenterologist about all of this, reports has colonoscopy coming up; advised if can't be seen by her GI folks soon and/or worsening of symptoms prompt re-eval or emergency eval especially if  severe symptoms arise -diflucan for the yeast issue per her request -she wants to get a flu shot today -foot exam done -of course, we advised Erin [name]  to return or notify a doctor immediately if symptoms worsen or persist or new concerns arise.    Patient Instructions  BEFORE YOU LEAVE: -flu shot -follow up: as scheduled and as needed  Call your gastroenterologist today. Let us know if we can do anything further on our end.  I hope you are feeling better  soon! Seek care promptly if your symptoms worsen, new concerns arise or you are not improving with treatment.       Erin Kern, DO

## 2018-08-24 ENCOUNTER — Ambulatory Visit: Payer: BLUE CROSS/BLUE SHIELD | Admitting: Physician Assistant

## 2018-08-24 ENCOUNTER — Encounter: Payer: Self-pay | Admitting: Physician Assistant

## 2018-08-24 VITALS — BP 120/82 | HR 76 | Ht 63.0 in | Wt 102.8 lb

## 2018-08-24 DIAGNOSIS — R1314 Dysphagia, pharyngoesophageal phase: Secondary | ICD-10-CM

## 2018-08-24 DIAGNOSIS — Z86010 Personal history of colonic polyps: Secondary | ICD-10-CM

## 2018-08-24 DIAGNOSIS — Z85048 Personal history of other malignant neoplasm of rectum, rectosigmoid junction, and anus: Secondary | ICD-10-CM | POA: Diagnosis not present

## 2018-08-24 NOTE — Progress Notes (Signed)
Chief Complaint: Dysphagia  HPI:    Erin [name] is a 62 year old female with a past medical history as listed below, known to Dr. Fuller Plan, who presents clinic today to discuss dysphagia.    08/10/2018 patient seen in clinic to discuss her history of rectal cancer and adenomatous polyps.  At that time was arranged for colonoscopy with Dr. Fuller Plan.    Today, patient presents to clinic and explains that over the past couple of months she has been having some painful swallowing as well as some difficulty swallowing.  She tells me that pills tend to get stuck, even if she takes them with apple juice and she also has trouble with food at times.  Tells me that when something is stuck in her throat it cuts her breath and she has to vomit it up.  Patient is slightly worried about the possibility of cancer due to her history.  Currently taking Omeprazole 40 mg daily with no breakthrough heartburn or reflux.    Denies fever, chills, weight loss, anorexia, nausea, vomiting or symptoms that awaken her from sleep.    Past Medical History:  Diagnosis Date  . Allergy   . Alopecia   . 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 Anxiety   . B12 deficiency anemia 09/14/2015  . Blood transfusion without reported diagnosis   . CAD (coronary artery disease), native coronary artery 10/13/2017   DES to mid RCA  . Chronic back pain   . Chronic daily headache 03/29/2013   takes bc powder  . Closed right hip fracture ([place] 09/10/2015  . Depression   . DM (diabetes mellitus) ([place]    diet controlled; does not check BG  . GERD (gastroesophageal reflux disease)   . History of hiatal hernia   . HLD (hyperlipidemia)   . Hot flashes   . Hyperlipemia 04/11/2014  . Hypokalemia 05/2016  . Hypothyroidism 09/10/2015  . IBS (irritable bowel syndrome) 03/29/2013  . 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 QT prolongation   . Tubular adenoma of colon  09/08/2003  . Vertigo   . Wears glasses     Past Surgical History:  Procedure Laterality Date  . ABDOMINAL AORTOGRAM N/A 08/02/2017   Procedure: ABDOMINAL AORTOGRAM;  Surgeon: AWellington Hampshire MD;  [facility];  Service: Cardiovascular;  Laterality: N/A;  . AORTA - BILATERAL FEMORAL ARTERY BYPASS GRAFT N/A 08/14/2017   Procedure: AORTA BIFEMORAL BYPASS GRAFT;  Surgeon: ERosetta Posner MD;  [facility];  Service: Vascular;  Laterality: N/A;  . COLONOSCOPY    . CORONARY STENT INTERVENTION N/A 10/13/2017   Procedure: CORONARY STENT INTERVENTION;  Surgeon: ENelva Bush MD;  [facility];  Service: Cardiovascular;  Laterality: N/A;  . dental implant    . ECTOPIC PREGNANCY SURGERY    . EMBOLECTOMY N/A 08/14/2017   Procedure: EMBOLECTOMY FEMORAL;  Surgeon: ERosetta Posner MD;  [facility];  Service: Vascular;  Laterality: N/A;  . FEMORAL-POPLITEAL BYPASS GRAFT Right 08/14/2017   Procedure: Right Femoral to Above Knee Popliteal Bypass Graft using Non-Reversed Greater Saphenous Vein Graft from Right Leg;  Surgeon: ERosetta Posner MD;  [place] [facility]  Service: Vascular;  Laterality: Right;  . FLEXIBLE SIGMOIDOSCOPY N/A 08/14/2013   Procedure: FLEXIBLE SIGMOIDOSCOPY;  Surgeon: MLadene Artist MD;  [facility];  Service: Endoscopy;  Laterality: N/A;  . LEFT HEART CATH AND CORONARY ANGIOGRAPHY N/A 10/13/2017  Procedure: LEFT HEART CATH AND CORONARY ANGIOGRAPHY;  Surgeon: Nelva Bush, MD;  Location: [place] [facility];  Service: Cardiovascular;  Laterality: N/A;  . LOWER EXTREMITY ANGIOGRAPHY Bilateral 08/02/2017   Procedure: Lower Extremity Angiography;  Surgeon: Wellington Hampshire, MD;  Location: [place] [facility];  Service: Cardiovascular;  Laterality: Bilateral;  . MULTIPLE TOOTH EXTRACTIONS    . PERIPHERAL VASCULAR INTERVENTION  05/22/2018   Procedure: PERIPHERAL VASCULAR INTERVENTION;  Surgeon: Serafina Mitchell, MD;  Location: [place]  [facility];  Service: Cardiovascular;;  SMA and Celiac  . PILONIDAL CYST EXCISION    . POLYPECTOMY    . THROMBECTOMY FEMORAL ARTERY Right 08/14/2017   Procedure: THROMBECTOMY FEMORAL ARTERY;  Surgeon: Rosetta Posner, MD;  Location: [place];  Service: Vascular;  Laterality: Right;  . TONSILLECTOMY    . TOTAL HIP ARTHROPLASTY  09/11/2015   Procedure: TOTAL HIP ARTHROPLASTY;  Surgeon: Renette Butters, MD;  Location: [place];  Service: Orthopedics;;  . ULTRASOUND GUIDANCE FOR VASCULAR ACCESS  10/13/2017   Procedure: Ultrasound Guidance For Vascular Access;  Surgeon: Nelva Bush, MD;  Location: [place] [facility];  Service: Cardiovascular;;    Current Outpatient Medications  Medication Sig Dispense Refill  . acetaminophen (TYLENOL) 500 MG tablet Take 1,000 mg by mouth daily as needed for headache (pain).     Marland Kitchen acyclovir ointment (ZOVIRAX) 5 % Apply 1 application topically every 3 (three) hours as needed (fever blisters). 15 g 0  . aspirin EC 81 MG tablet Take 81 mg by mouth daily.    Marland Kitchen BRILINTA 90 MG TABS tablet TAKE 1 TABLET BY MOUTH 2 TIMES DAILY. 180 tablet 1  . Cholecalciferol (VITAMIN D) 2000 units tablet Take 2,000 Units by mouth daily.    Marland Kitchen dicyclomine (BENTYL) 10 MG capsule TAKE ONE CAPSULE BY MOUTH THREE TIMES DAILY BEFORE MEALS. DUE FOR FOLLOW UP IN MAY, NEEDS TO SCHEDULE AN APPOINTMENT 270 capsule 1  . DULoxetine (CYMBALTA) 20 MG capsule Take 2 capsules daily (Patient taking differently: Take 40 mg by mouth daily. ) 180 capsule 3  . fluticasone (FLONASE) 50 MCG/ACT nasal spray Place 1 spray into both nostrils daily. 48 g 1  . gabapentin (NEURONTIN) 600 MG tablet TAKE 1 TABLET BY MOUTH THREE TIMES DAILY GENERIC EQUIVALENT FOR NEURONTIN 270 tablet 0  . levothyroxine (SYNTHROID, LEVOTHROID) 50 MCG tablet Take 1 tablet (50 mcg total) by mouth daily. 90 tablet 0  . LINZESS 145 MCG CAPS capsule TAKE ONE CAPSULE BY MOUTH DAILY BEFORE BREAKFAST 90 capsule 2  . meclizine (ANTIVERT) 12.5 MG tablet  Take 1 tablet (12.5 mg total) by mouth 3 (three) times daily as needed for dizziness. 30 tablet 6  . metoprolol succinate (TOPROL-XL) 25 MG 24 hr tablet TAKE 1 TABLET BY MOUTH TWICE A DAY 180 tablet 1  . Na Sulfate-K Sulfate-Mg Sulf 17.5-3.13-1.6 GM/177ML SOLN Take 1 kit by mouth as directed. 354 mL 0  . nitroGLYCERIN (NITROSTAT) 0.4 MG SL tablet Place 1 tablet (0.4 mg total) under the tongue every 5 (five) minutes as needed for chest pain. 100 tablet 1  . nystatin-triamcinolone (MYCOLOG II) cream APPLY TO CORNERS OF THE MOUTH TWICE A DAY AS NEEDED FOR CRACKING/BURNING  2  . omeprazole (PRILOSEC) 40 MG capsule TAKE ONE CAPSULE BY MOUTH DAILY. NEED APPOINTMENT FOR FURTHER REFILLS (Patient taking differently: Take 40 mg by mouth twice daily) 90 capsule 1  . potassium chloride SA (K-DUR,KLOR-CON) 20 MEQ tablet TAKE 1 TABLET BY MOUTH DAILY 90 tablet 1  .  rosuvastatin (CRESTOR) 40 MG tablet Take 1 tablet (40 mg total) by mouth daily. 90 tablet 3  . Tetrahydrozoline HCl (VISINE OP) Place 1 drop into both eyes daily as needed (redness).    . vitamin B-12 1000 MCG tablet Take 1 tablet (1,000 mcg total) by mouth daily. 30 tablet 0   No current facility-administered medications for this visit.     Allergies as of 08/24/2018 - Review Complete 08/16/2018  Allergen Reaction Noted  . Amitiza [lubiprostone] Diarrhea 06/06/2016  . Nortriptyline Other (See Comments) 06/06/2016  . Sulfa antibiotics Nausea And Vomiting and Other (See Comments) 03/29/2013  . Tramadol Itching and Rash 12/01/2017  . Atorvastatin Nausea And Vomiting 03/09/2015  . Claritin [loratadine] Other (See Comments) 03/29/2013  . Dexamethasone Itching 09/09/2013  . Prednisone Rash 04/24/2013    Family History  Problem Relation Age of Onset  . Arthritis Mother   . Hyperlipidemia Mother   . Heart disease Mother   . Hypertension Mother   . Stroke Mother 79  . Irritable bowel syndrome Mother   . Thyroid disease Mother   . Heart attack  Mother   . Heart disease Father   . Hyperlipidemia Father   . Hypertension Father   . Stroke Father 7  . Thyroid disease Father   . Prostate cancer Father   . Heart attack Father   . Lung cancer Brother   . Heart attack Maternal Grandfather   . Heart attack Maternal Uncle   . Colon cancer Neg Hx   . Rectal cancer Neg Hx   . Stomach cancer Neg Hx     Social History   Socioeconomic History  . Marital status: Married    Spouse name: Not on file  . Number of children: 0  . Years of education: Not on file  . Highest education level: Not on file  Occupational History  . Occupation: caregiver  Social Needs  . Financial resource strain: Not on file  . Food insecurity:    Worry: Not on file    Inability: Not on file  . Transportation needs:    Medical: Not on file    Non-medical: Not on file  Tobacco Use  . Smoking status: Former Smoker    Packs/day: 0.50    Types: Cigarettes    Last attempt to quit: 10/07/2014    Years since quitting: 3.8  . Smokeless tobacco: Never Used  Substance and Sexual Activity  . Alcohol use: No    Alcohol/week: 0.0 standard drinks  . Drug use: No  . Sexual activity: Yes    Partners: Male  Lifestyle  . Physical activity:    Days per week: Not on file    Minutes per session: Not on file  . Stress: Not on file  Relationships  . Social connections:    Talks on phone: Not on file    Gets together: Not on file    Attends religious service: Not on file    Active member of club or organization: Not on file    Attends meetings of clubs or organizations: Not on file    Relationship status: Not on file  . Intimate partner violence:    Fear of current or ex partner: Not on file    Emotionally abused: Not on file    Physically abused: Not on file    Forced sexual activity: Not on file  Other Topics Concern  . Not on file  Social History Narrative   Work or School: homemaker  Home Situation: lives with her husband,[name] takes care of  her elderly parents       Spiritual Beliefs: Christian      Lifestyle: no regular exercise, poor diet             Review of Systems:    Constitutional: No weight loss, fever or chills Cardiovascular: No chest pain Respiratory: No SOB Gastrointestinal: See HPI and otherwise negative   Physical Exam:  Vital signs: BP 120/82   Pulse 76   Ht _0  (1.6 m)   Wt 102 lb 12.8 oz (46.6 kg)   SpO2 98%   BMI 18.21 kg/m   Constitutional:   Pleasant Caucasian female appears to be in NAD, Well developed, Well nourished, alert and cooperative Respiratory: Respirations even and unlabored. Lungs clear to auscultation bilaterally.   No wheezes, crackles, or rhonchi.  Cardiovascular: Normal S1, S2. No MRG. Regular rate and rhythm. No peripheral edema, cyanosis or pallor.  Gastrointestinal:  Soft, nondistended, nontender. No rebound or guarding. Normal bowel sounds. No appreciable masses or hepatomegaly. Psychiatric: Demonstrates good judgement and reason without abnormal affect or behaviors.  RELEVANT LABS AND IMAGING: CBC    Component Value Date/Time   WBC 9.3 04/10/2018 1128   WBC 7.9 01/09/2018 1438   RBC 4.02 04/10/2018 1128   RBC 3.77 (L) 01/09/2018 1438   HGB 12.9 05/22/2018 0608   HGB 12.3 04/10/2018 1128   HGB 10.6 (L) 11/28/2013 1148   HCT 38.0 05/22/2018 0608   HCT 38.4 04/10/2018 1128   HCT 31.2 (L) 11/28/2013 1148   PLT 267 04/10/2018 1128   MCV 96 04/10/2018 1128   MCV 101.4 (H) 11/28/2013 1148   MCH 30.6 04/10/2018 1128   MCH 29.8 10/14/2017 0333   MCHC 32.0 04/10/2018 1128   MCHC 33.2 01/09/2018 1438   RDW 13.2 04/10/2018 1128   RDW 15.4 (H) 11/28/2013 1148   LYMPHSABS 1.4 07/25/2017 1155   LYMPHSABS 0.8 (L) 11/28/2013 1148   MONOABS 0.4 06/27/2016 1041   MONOABS 0.9 11/28/2013 1148   EOSABS 0.1 07/25/2017 1155   BASOSABS 0.0 07/25/2017 1155   BASOSABS 0.0 11/28/2013 1148    CMP     Component Value Date/Time   NA 140 05/22/2018 0608   NA 140 04/10/2018  1128   NA 139 04/22/2014 1028   K 3.2 (L) 05/22/2018 0608   K 4.1 04/22/2014 1028   CL 106 05/22/2018 0608   CO2 24 04/10/2018 1128   CO2 23 04/22/2014 1028   GLUCOSE 88 05/22/2018 0608   GLUCOSE 81 04/22/2014 1028   BUN 13 05/22/2018 0608   BUN 14 04/10/2018 1128   BUN 8.8 04/22/2014 1028   CREATININE 0.80 05/22/2018 0608   CREATININE 0.8 04/22/2014 1028   CALCIUM 9.9 04/10/2018 1128   CALCIUM 9.1 04/22/2014 1028   PROT 4.5 (L) 08/18/2017 0619   PROT 6.2 (L) 11/28/2013 1148   ALBUMIN 1.8 (L) 08/18/2017 0619   ALBUMIN 3.1 (L) 11/28/2013 1148   AST 34 08/18/2017 0619   AST 11 11/28/2013 1148   ALT 8 04/03/2018 1603   ALT 9 11/28/2013 1148   ALKPHOS 65 08/18/2017 0619   ALKPHOS 80 11/28/2013 1148   BILITOT 1.4 (H) 08/18/2017 0619   BILITOT 0.27 11/28/2013 1148   GFRNONAA 81 04/10/2018 1128   GFRAA 93 04/10/2018 1128    Assessment: 1.  Dysphagia: Over the past 2 months, pills as well as some solid food; consider most likely esophageal stricture versus other 2.  History of  anal cancer and adenomatous polyps  Plan: 1.  Patient was already scheduled for a colonoscopy on 09/13/2018.  We went ahead and canceled this and added an EGD.  Patient is now scheduled 10/03/2018 with Dr. Fuller Plan in [facility] for an EGD with dilation and a surveillance colonoscopy.  Did discuss risk, benefits, limitations and alternatives the patient agrees to proceed. 2.  Martin Majestic ahead and scheduled for EGD as patient is concerned about cancer, especially with her history of rectal cancer. 3.  Reviewed anti-dysphagia measures including taking small bites, drinking sips of water in between bites and the chin tuck technique. 4.  Patient will follow in clinic per recommendations from Dr. Fuller Plan after time of procedures.  Ellouise Newer, PA-C [facility] 08/24/2018, 1:33 PM  Cc: Lucretia Kern, DO

## 2018-08-24 NOTE — Patient Instructions (Signed)
If you are age 62 or older, your body mass index should be between 23-30. Your Body mass index is 18.21 kg/m. If this is out of the aforementioned range listed, please consider follow up with your Primary Care Provider.  If you are age 32 or younger, your body mass index should be between 19-25. Your Body mass index is 18.21 kg/m. If this is out of the aformentioned range listed, please consider follow up with your Primary Care Provider.   You have been scheduled for an endoscopy and colonoscopy. Please follow the written instructions given to you at your visit today. Please pick up your prep supplies at the pharmacy within the next 1-3 days. If you use inhalers (even only as needed), please bring them with you on the day of your procedure. Your physician has requested that you go to www.startemmi.com and enter the access code given to you at your visit today. This web site gives a general overview about your procedure. However, you should still follow specific instructions given to you by our office regarding your preparation for the procedure.  HOLD BRILINTA FIVE DAYS PRIOR TO YOUR PROCEDURE.  Thank you for choosing me and [facility].   Ellouise Newer, PA-C

## 2018-08-24 NOTE — Progress Notes (Signed)
Reviewed and agree with management plan.  Malcolm T. Stark, MD FACG 

## 2018-08-27 ENCOUNTER — Telehealth: Payer: Self-pay | Admitting: Family Medicine

## 2018-08-27 NOTE — Telephone Encounter (Signed)
Copied from [place] #[id number]. Topic: General - Other >> Aug 27, 2018 12:46 PM Marin Olp L wrote: Reason for CRM: Patient would like a call from Phil Campbell about what she thinks is a UTI. Patient declined appointment and nurse triage.

## 2018-08-27 NOTE — Telephone Encounter (Signed)
I called the pt and she stated she has had a burning sensation with urination and has been wearing a Depends.  I offered an appt today or tomorrow with Dr Maudie Mercury and the pt declined.  Stated she is too tired to get dressed to come to [facility] for an appt and asked if medication could be sent to her pharmacy.  I again offered an appt as she should be seen.  Patient declined and stated the only day she can come here is on Friday.  Appt scheduled with Tommi Rumps at 2:45pm. Patient stated she will call back if needed prior to Friday.

## 2018-08-28 ENCOUNTER — Other Ambulatory Visit: Payer: Self-pay | Admitting: *Deleted

## 2018-08-28 MED ORDER — LEVOTHYROXINE SODIUM 50 MCG PO TABS: 50.0000 ug | ORAL_TABLET | Freq: Every day | ORAL | 0 refills | Status: DC

## 2018-08-28 MED ORDER — GABAPENTIN 600 MG PO TABS: ORAL_TABLET | ORAL | 0 refills | Status: DC

## 2018-08-28 NOTE — Telephone Encounter (Signed)
Rx done. 

## 2018-08-28 NOTE — Telephone Encounter (Signed)
I called the pt and informed her of the message below. Patient declined appt and stated she feels better today, used Cranberry and cherry juice and will call back if needed.

## 2018-08-28 NOTE — Telephone Encounter (Signed)
Please call pt and advise to work pt in with me - want to check her urine and her labs, make sure potassium is good etc. Please double book or put in one of my admin spots today instead of with Tommi Rumps if I am full. Prefer she sees me. Thanks.

## 2018-08-31 ENCOUNTER — Ambulatory Visit: Payer: BLUE CROSS/BLUE SHIELD | Admitting: Podiatry

## 2018-08-31 ENCOUNTER — Ambulatory Visit: Payer: BLUE CROSS/BLUE SHIELD | Admitting: Adult Health

## 2018-09-06 ENCOUNTER — Ambulatory Visit: Payer: BLUE CROSS/BLUE SHIELD | Admitting: Podiatry

## 2018-09-06 ENCOUNTER — Encounter: Payer: Self-pay | Admitting: Podiatry

## 2018-09-06 ENCOUNTER — Encounter: Payer: Self-pay | Admitting: Family Medicine

## 2018-09-06 ENCOUNTER — Ambulatory Visit: Payer: BLUE CROSS/BLUE SHIELD | Admitting: Family Medicine

## 2018-09-06 VITALS — BP 100/60 | HR 62 | Temp 98.0°F | Ht 63.0 in | Wt 102.3 lb

## 2018-09-06 DIAGNOSIS — I999 Unspecified disorder of circulatory system: Secondary | ICD-10-CM | POA: Diagnosis not present

## 2018-09-06 DIAGNOSIS — R636 Underweight: Secondary | ICD-10-CM

## 2018-09-06 DIAGNOSIS — B351 Tinea unguium: Secondary | ICD-10-CM | POA: Diagnosis not present

## 2018-09-06 DIAGNOSIS — D473 Essential (hemorrhagic) thrombocythemia: Secondary | ICD-10-CM

## 2018-09-06 DIAGNOSIS — Z8619 Personal history of other infectious and parasitic diseases: Secondary | ICD-10-CM | POA: Diagnosis not present

## 2018-09-06 DIAGNOSIS — R300 Dysuria: Secondary | ICD-10-CM

## 2018-09-06 DIAGNOSIS — M461 Sacroiliitis, not elsewhere classified: Secondary | ICD-10-CM

## 2018-09-06 HISTORY — DX: Sacroiliitis, not elsewhere classified: M46.1

## 2018-09-06 HISTORY — DX: Essential (hemorrhagic) thrombocythemia: D47.3

## 2018-09-06 LAB — URINALYSIS, MICROSCOPIC ONLY

## 2018-09-06 LAB — POCT URINALYSIS DIPSTICK
BILIRUBIN UA: NEGATIVE
Glucose, UA: NEGATIVE
KETONES UA: NEGATIVE
Nitrite, UA: NEGATIVE
Protein, UA: POSITIVE — AB
SPEC GRAV UA: 1.01 (ref 1.010–1.025)
Urobilinogen, UA: 0.2 E.U./dL
pH, UA: 5 (ref 5.0–8.0)

## 2018-09-06 MED ORDER — NITROFURANTOIN MONOHYD MACRO 100 MG PO CAPS: 100.0000 mg | ORAL_CAPSULE | Freq: Two times a day (BID) | ORAL | 0 refills | Status: DC

## 2018-09-06 MED ORDER — ACYCLOVIR 5 % EX OINT
1.0000 | TOPICAL_OINTMENT | CUTANEOUS | 3 refills | Status: DC | PRN
Start: 2018-09-06 — End: 2021-04-07

## 2018-09-06 NOTE — Patient Instructions (Addendum)
BEFORE YOU LEAVE: -phq9  -udip and culture -follow up: In December as schedule, sooner as needed  We have ordered labs or studies at this visit. It can take up to 1-2 weeks for results and processing. IF results require follow up or explanation, we will call you with instructions. Clinically stable results will be released to your [facility]. If you have not heard from [place] or cannot find your results in [facility] in 2 weeks please contact our office at [phone].  If you are not yet signed up for [facility], please consider signing up.  Schedule your diabetic eye exam if not done.

## 2018-09-06 NOTE — Progress Notes (Signed)
HPI:  Using dictation device. Unfortunately this device frequently misinterprets words/phrases.  Acute visit for Dysuria: -started last week, intermittent -drinking cranberry juice, using azo -symptoms of frequency, urgency, dysuria -no fevers, malaise, hematuria, vaginal symptoms, NV, abd pain, flank pain  Low weight/dysphagia: -reports doing well, weight up some and energy good -per review of chart saw GI  Hx cold sores: -wants refill of acyclovir, uses sporadically  ROS: See pertinent positives and negatives per HPI.  Past Medical History:  Diagnosis Date  . Allergy   . Alopecia   . 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 Anxiety   . B12 deficiency anemia 09/14/2015  . Blood transfusion without reported diagnosis   . CAD (coronary artery disease), native coronary artery 10/13/2017   DES to mid RCA  . Chronic back pain   . Chronic daily headache 03/29/2013   takes bc powder  . Closed right hip fracture [place] 09/10/2015  . Depression   . [place]    diet controlled; does not check BG  . GERD (gastroesophageal reflux disease)   . History of hiatal hernia   . HLD (hyperlipidemia)   . Hot flashes   . Hyperlipemia 04/11/2014  . Hypokalemia 05/2016  . Hypothyroidism 09/10/2015  . IBS (irritable bowel syndrome) 03/29/2013  . 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 QT prolongation   . Tubular adenoma of colon 09/08/2003  . Vertigo   . Wears glasses     Past Surgical History:  Procedure Laterality Date  . ABDOMINAL AORTOGRAM N/A 08/02/2017   Procedure: ABDOMINAL AORTOGRAM;  Surgeon: AWellington Hampshire MD;  [facility];  Service: Cardiovascular;  Laterality: N/A;  . AORTA - BILATERAL FEMORAL ARTERY BYPASS GRAFT N/A 08/14/2017   Procedure: AORTA BIFEMORAL BYPASS GRAFT;  Surgeon: ERosetta Posner MD;  [facility];  Service: Vascular;  Laterality: N/A;  .  COLONOSCOPY    . CORONARY STENT INTERVENTION N/A 10/13/2017   Procedure: CORONARY STENT INTERVENTION;  Surgeon: ENelva Bush MD;  [place] [facility];  Service: Cardiovascular;  Laterality: N/A;  . dental implant    . ECTOPIC PREGNANCY SURGERY    . EMBOLECTOMY N/A 08/14/2017   Procedure: EMBOLECTOMY FEMORAL;  Surgeon: ERosetta Posner MD;  [facility];  Service: Vascular;  Laterality: N/A;  . FEMORAL-POPLITEAL BYPASS GRAFT Right 08/14/2017   Procedure: Right Femoral to Above Knee Popliteal Bypass Graft using Non-Reversed Greater Saphenous Vein Graft from Right Leg;  Surgeon: ERosetta Posner MD;  [place]  Service: Vascular;  Laterality: Right;  . FLEXIBLE SIGMOIDOSCOPY N/A 08/14/2013   Procedure: FLEXIBLE SIGMOIDOSCOPY;  Surgeon: MLadene Artist MD;  [facility];  Service: Endoscopy;  Laterality: N/A;  . LEFT HEART CATH AND CORONARY ANGIOGRAPHY N/A 10/13/2017   Procedure: LEFT HEART CATH AND CORONARY ANGIOGRAPHY;  Surgeon: ENelva Bush MD;  [place];  Service: Cardiovascular;  Laterality: N/A;  . LOWER EXTREMITY ANGIOGRAPHY Bilateral 08/02/2017   Procedure: Lower Extremity Angiography;  Surgeon: AWellington Hampshire MD;  [place] [facility];  Service: Cardiovascular;  Laterality: Bilateral;  . MULTIPLE TOOTH EXTRACTIONS    . PERIPHERAL VASCULAR INTERVENTION  05/22/2018   Procedure: PERIPHERAL VASCULAR INTERVENTION;  Surgeon: BSerafina Mitchell MD;  [facility];  Service: Cardiovascular;;  SMA and Celiac  . PILONIDAL CYST EXCISION    . POLYPECTOMY    . THROMBECTOMY  FEMORAL ARTERY Right 08/14/2017   Procedure: THROMBECTOMY FEMORAL ARTERY;  Surgeon: Rosetta Posner, MD;  Location: [place];  Service: Vascular;  Laterality: Right;  . TONSILLECTOMY    . TOTAL HIP ARTHROPLASTY  09/11/2015   Procedure: TOTAL HIP ARTHROPLASTY;  Surgeon: Renette Butters, MD;  Location: [place];  Service: Orthopedics;;  . ULTRASOUND GUIDANCE FOR  VASCULAR ACCESS  10/13/2017   Procedure: Ultrasound Guidance For Vascular Access;  Surgeon: Nelva Bush, MD;  Location: [place] [facility];  Service: Cardiovascular;;    Family History  Problem Relation Age of Onset  . Arthritis Mother   . Hyperlipidemia Mother   . Heart disease Mother   . Hypertension Mother   . Stroke Mother 33  . Irritable bowel syndrome Mother   . Thyroid disease Mother   . Heart attack Mother   . Heart disease Father   . Hyperlipidemia Father   . Hypertension Father   . Stroke Father 32  . Thyroid disease Father   . Prostate cancer Father   . Heart attack Father   . Lung cancer Brother   . Heart attack Maternal Grandfather   . Heart attack Maternal Uncle   . Colon cancer Neg Hx   . Rectal cancer Neg Hx   . Stomach cancer Neg Hx     SOCIAL HX: see hpi   Current Outpatient Medications:  .  acetaminophen (TYLENOL) 500 MG tablet, Take 1,000 mg by mouth daily as needed for headache (pain). , Disp: , Rfl:  .  acyclovir ointment (ZOVIRAX) 5 %, Apply 1 application topically every 3 (three) hours as needed (fever blisters)., Disp: 15 g, Rfl: 3 .  aspirin EC 81 MG tablet, Take 81 mg by mouth daily., Disp: , Rfl:  .  BRILINTA 90 MG TABS tablet, TAKE 1 TABLET BY MOUTH 2 TIMES DAILY., Disp: 180 tablet, Rfl: 1 .  Cholecalciferol (VITAMIN D) 2000 units tablet, Take 2,000 Units by mouth daily., Disp: , Rfl:  .  dicyclomine (BENTYL) 10 MG capsule, TAKE ONE CAPSULE BY MOUTH THREE TIMES DAILY BEFORE MEALS. DUE FOR FOLLOW UP IN MAY, NEEDS TO SCHEDULE AN APPOINTMENT, Disp: 270 capsule, Rfl: 1 .  DULoxetine (CYMBALTA) 20 MG capsule, Take 2 capsules daily (Patient taking differently: Take 40 mg by mouth daily. ), Disp: 180 capsule, Rfl: 3 .  fluticasone (FLONASE) 50 MCG/ACT nasal spray, Place 1 spray into both nostrils daily., Disp: 48 g, Rfl: 1 .  gabapentin (NEURONTIN) 600 MG tablet, TAKE 1 TABLET BY MOUTH THREE TIMES DAILY GENERIC EQUIVALENT FOR NEURONTIN, Disp: 270  tablet, Rfl: 0 .  levothyroxine (SYNTHROID, LEVOTHROID) 50 MCG tablet, Take 1 tablet (50 mcg total) by mouth daily., Disp: 90 tablet, Rfl: 0 .  LINZESS 145 MCG CAPS capsule, TAKE ONE CAPSULE BY MOUTH DAILY BEFORE BREAKFAST, Disp: 90 capsule, Rfl: 2 .  meclizine (ANTIVERT) 12.5 MG tablet, Take 1 tablet (12.5 mg total) by mouth 3 (three) times daily as needed for dizziness., Disp: 30 tablet, Rfl: 6 .  metoprolol succinate (TOPROL-XL) 25 MG 24 hr tablet, TAKE 1 TABLET BY MOUTH TWICE A DAY, Disp: 180 tablet, Rfl: 1 .  Na Sulfate-K Sulfate-Mg Sulf 17.5-3.13-1.6 GM/177ML SOLN, Take 1 kit by mouth as directed., Disp: 354 mL, Rfl: 0 .  nystatin-triamcinolone (MYCOLOG II) cream, APPLY TO CORNERS OF THE MOUTH TWICE A DAY AS NEEDED FOR CRACKING/BURNING, Disp: , Rfl: 2 .  omeprazole (PRILOSEC) 40 MG capsule, TAKE ONE CAPSULE BY MOUTH DAILY. NEED APPOINTMENT FOR FURTHER REFILLS (Patient  taking differently: Take 40 mg by mouth twice daily), Disp: 90 capsule, Rfl: 1 .  potassium chloride SA (K-DUR,KLOR-CON) 20 MEQ tablet, TAKE 1 TABLET BY MOUTH DAILY, Disp: 90 tablet, Rfl: 1 .  Tetrahydrozoline HCl (VISINE OP), Place 1 drop into both eyes daily as needed (redness)., Disp: , Rfl:  .  vitamin B-12 1000 MCG tablet, Take 1 tablet (1,000 mcg total) by mouth daily., Disp: 30 tablet, Rfl: 0 .  nitroGLYCERIN (NITROSTAT) 0.4 MG SL tablet, Place 1 tablet (0.4 mg total) under the tongue every 5 (five) minutes as needed for chest pain., Disp: 100 tablet, Rfl: 1 .  rosuvastatin (CRESTOR) 40 MG tablet, Take 1 tablet (40 mg total) by mouth daily., Disp: 90 tablet, Rfl: 3  EXAM:  Vitals:   09/06/18 1027  BP: 100/60  Pulse: 62  Temp: 98 F (36.7 C)    Body mass index is 18.12 kg/m.  GENERAL: vitals reviewed and listed above, alert, oriented, appears well hydrated and in no acute distress  HEENT: atraumatic, conjunttiva clear, no obvious abnormalities on inspection of external nose and ears  NECK: no obvious masses on  inspection  LUNGS: clear to auscultation bilaterally, no wheezes, rales or rhonchi, good air movement  CV: HRRR, no peripheral edema  ABD: BS+ , soft, NTTP, no CVA TTP  MS: moves all extremities without noticeable abnormality  PSYCH: pleasant and cooperative, no obvious depression or anxiety  ASSESSMENT AND PLAN:  Discussed the following assessment and plan:  Dysuria  H/O cold sores  Low weight  -udip and culture, treat accordingly, follow up as needed -weight up some, healthy diet advised, glad feeling better -refilled top antiviral -follow up in December as planned, sooner as needed -Patient advised to return or notify a doctor immediately if symptoms worsen or persist or new concerns arise.  Patient Instructions  BEFORE YOU LEAVE: -phq9  -udip and culture -follow up: In December as schedule, sooner as needed  We have ordered labs or studies at this visit. It can take up to 1-2 weeks for results and processing. IF results require follow up or explanation, we will call you with instructions. Clinically stable results will be released to your [facility]. If you have not heard from [place] or cannot find your results in [facility] in 2 weeks please contact our office at [phone].  If you are not yet signed up for [facility], please consider signing up.  Schedule your diabetic eye exam if not done.        Lucretia Kern, DO

## 2018-09-06 NOTE — Addendum Note (Signed)
Addended by: Agnes Lawrence on: 09/06/2018 11:08 AM   Modules accepted: Orders

## 2018-09-06 NOTE — Addendum Note (Signed)
Addended by: Agnes Lawrence on: 09/06/2018 10:59 AM   Modules accepted: Orders

## 2018-09-07 MED ORDER — TERBINAFINE HCL 250 MG PO TABS: ORAL_TABLET | ORAL | 0 refills | Status: DC

## 2018-09-07 NOTE — Progress Notes (Signed)
Subjective:   Patient ID: Erin [name], female   DOB: 62 y.o.   MRN: [id number]   HPI Patient presents stating she has a discoloration of her left big toenail and it involves the majority of the nail.  Does not remember injury and states is been this way for at least 6 months and gradually has become thicker and itchy at times.  States at this point she is stable from the vascular issues that we diagnosed last year and she is had numerous stents put in and is very appreciative   ROS      Objective:  Physical Exam  Neurovascular status is intact currently with patient noted to have a discolored left hallux nail distal three quarters of the nailbed with moderate thickness of the nail but no pain upon pressure.  Patient is found to have good digital perfusion and is well oriented x3 probability for mycotic nail infection left which may be systemic or may be trauma with localized issue     Assessment:  Above assessment as listed in the objective     Plan:  Reviewed condition and explained trauma versus hereditary and systemic.  Today I placed her on a Lamisil pulse pack of 7 pills a month for 4 months to try to reduce the fungal count and we will start laser treatment.  Patient will have 4-6 laser treatments for this nail does understand there is no guarantee will cure the condition

## 2018-09-08 LAB — URINE CULTURE
MICRO NUMBER:: 91249785
SPECIMEN QUALITY:: ADEQUATE

## 2018-09-13 ENCOUNTER — Encounter: Payer: BLUE CROSS/BLUE SHIELD | Admitting: Gastroenterology

## 2018-09-14 ENCOUNTER — Other Ambulatory Visit (INDEPENDENT_AMBULATORY_CARE_PROVIDER_SITE_OTHER): Payer: BLUE CROSS/BLUE SHIELD

## 2018-09-14 DIAGNOSIS — E039 Hypothyroidism, unspecified: Secondary | ICD-10-CM

## 2018-09-14 LAB — TSH: TSH: 0.36 u[IU]/mL (ref 0.35–4.50)

## 2018-09-27 ENCOUNTER — Telehealth: Payer: Self-pay | Admitting: Gastroenterology

## 2018-09-27 DIAGNOSIS — R112 Nausea with vomiting, unspecified: Secondary | ICD-10-CM

## 2018-09-27 DIAGNOSIS — K2970 Gastritis, unspecified, without bleeding: Secondary | ICD-10-CM

## 2018-09-27 MED ORDER — DICYCLOMINE HCL 10 MG PO CAPS: ORAL_CAPSULE | ORAL | 1 refills | Status: DC

## 2018-09-27 NOTE — Telephone Encounter (Signed)
Prescription sent to patient's pharmacy.

## 2018-10-03 ENCOUNTER — Encounter: Payer: BLUE CROSS/BLUE SHIELD | Admitting: Gastroenterology

## 2018-10-08 NOTE — Progress Notes (Signed)
Cardiology Office Note:    Date:  10/09/2018   ID:  CIRCE CHILTON, DOB 1956/09/30, MRN [id number]  PCP:  Lucretia Kern, DO  Cardiologist:  [name], MD   Electrophysiologist:  None   Referring MD: Lucretia Kern, DO   Chief Complaint  Patient presents with  . Follow-up    CAD     History of Present Illness:    Erin [name] is a 62 y.o. female with  coronary artery disease status post DES to the mid RCA in November 2018, mixed ischemic and nonischemic cardiomyopathy, peripheral arterial disease status post aortobifem and bilateral femoral to popliteal bypasses in September 2018, chronic mesenteric ischemia, chronic hypokalemia, anal carcinoma.  Echocardiogram in December 2018 demonstrated reduced LV function with an EF of 35-40%.  It was felt that her cardiomyopathy was more suggestive of a stress-induced cardiomyopathy.  The patient was unable to tolerate low-dose ACE inhibitor.  Follow-up echocardiogram in March 2019 demonstrated improved LV function with an EF of 55-60%.  She was last seen in 5/19.    She underwent abdominal aortogram in 7/19 and was noted to have significant SMA and celiac artery stenoses which were treated with angioplasty and stenting.    Erin [name] returns for follow-up.  She is here today with her husband.  Since last seen, she denies any recurrent chest discomfort.  She remains short of breath with some activities.  This is unchanged.  She denies orthopnea, paroxysmal nocturnal dyspnea or lower extremity swelling.  She denies any syncope.  She has had a recent urinary tract infection.  She has also noted some chills and sweats.  I have asked her to follow-up with primary care if she has recurrent UTI symptoms.  Prior CV studies:   The following studies were reviewed today:  Echo 01/24/18 EF 55-60, no RWMA, normal diastolic function, trivial MR/TR  Echo 10/23/2017 EF 35-40  Cardiac catheterization 10/13/2017 LAD mid 40 LCx normal RCA  proximal 30, mid 99, 30, distal 50 EF 40-45 PCI: 2 x 12 mm Resolute Onyx DES to the mid RCA  Nuclear stress test 08/09/2017 EF 44, normal perfusion, low risk  Echo 06/08/2016 EF 55-60, normal wall motion, grade 1 diastolic dysfunction  Past Medical History:  Diagnosis Date  . Allergy   . Alopecia   . Anal cancer ([place]) 08/14/13   invasive squamous cell ca, s/p radiation 10/20-11/26/14 60.4Gy/58fx and chemo  . Anxiety   . 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 B12 deficiency anemia 09/14/2015  . Blood transfusion without reported diagnosis   . BPPV (benign paroxysmal positional vertigo) 07/08/2015  . CAD (coronary artery disease), native coronary artery 10/13/2017   DES to mid RCA  . Cardiomyopathy ([place]) 11/03/2017  . Chronic back pain   . Chronic daily headache 03/29/2013   takes bc powder  . Closed right hip fracture ([place]) 09/10/2015  . Coronary artery disease involving native coronary artery of native heart without angina pectoris 10/13/2017   DES to mid RCA  . Depression   . DM (diabetes mellitus) ([place])    diet controlled; does not check BG  . Elevated transaminase level 06/07/2016  . Essential (hemorrhagic) thrombocythemia ([place]) 09/06/2018  . Family history of early CAD 04/08/2016  . Gait instability 07/08/2015  . GERD (gastroesophageal reflux disease)   . GERD without esophagitis 09/10/2015  . History of hiatal hernia   . HLD (hyperlipidemia)   . Hot flashes   . Hyperlipemia 04/11/2014  . Hyperlipidemia associated with  type 2 diabetes mellitus ([place]) 05/11/2017  . Hyperlipidemia LDL goal <70 04/11/2014  . Hypokalemia 05/2016  . Hypomagnesemia 10/08/2017  . Hypophosphatemia 10/08/2017  . Hypothyroidism 09/10/2015  . IBS (irritable bowel syndrome) 03/29/2013  . Nausea & vomiting 06/27/2015  . Neck pain 07/08/2015  . 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 Peripheral vascular disease ([place]) 11/03/2017  . QT prolongation   . Sacroiliitis ([place]) 09/06/2018  . Spasms  of the hands or feet 10/08/2017  . Syncope 06/07/2016  . Tubular adenoma of colon 09/08/2003  . Type 2 diabetes mellitus without complication, without long-term current use of insulin ([place]) 03/18/2016  . Unstable angina ([place])   . Vertigo   . Wears glasses    Surgical Hx: The patient  has a past surgical history that includes Ectopic pregnancy surgery; Pilonidal cyst excision; Colonoscopy; Flexible sigmoidoscopy (N/A, 08/14/2013); Polypectomy; Tonsillectomy; Total hip arthroplasty (09/11/2015); Lower Extremity Angiography (Bilateral, 08/02/2017); ABDOMINAL AORTOGRAM (N/A, 08/02/2017); Multiple tooth extractions; dental implant; Embolectomy (N/A, 08/14/2017); Femoral-popliteal Bypass Graft (Right, 08/14/2017); Aorta - bilateral femoral artery bypass graft (N/A, 08/14/2017); Thrombectomy femoral artery (Right, 08/14/2017); LEFT HEART CATH AND CORONARY ANGIOGRAPHY (N/A, 10/13/2017); Ultrasound guidance for vascular access (10/13/2017); CORONARY STENT INTERVENTION (N/A, 10/13/2017); and PERIPHERAL VASCULAR INTERVENTION (05/22/2018).   Current Medications: Current Meds  Medication Sig  . acetaminophen (TYLENOL) 500 MG tablet Take 1,000 mg by mouth daily as needed for headache (pain).   Marland Kitchen acyclovir ointment (ZOVIRAX) 5 % Apply 1 application topically every 3 (three) hours as needed (fever blisters).  Marland Kitchen aspirin EC 81 MG tablet Take 81 mg by mouth daily.  Marland Kitchen BRILINTA 90 MG TABS tablet TAKE 1 TABLET BY MOUTH 2 TIMES DAILY.  Marland Kitchen Cholecalciferol (VITAMIN D) 2000 units tablet Take 2,000 Units by mouth daily.  Marland Kitchen dicyclomine (BENTYL) 10 MG capsule TAKE ONE CAPSULE BY MOUTH THREE TIMES DAILY BEFORE MEALS.  . DULoxetine (CYMBALTA) 20 MG capsule Take 2 capsules daily  . fluticasone (FLONASE) 50 MCG/ACT nasal spray Place 1 spray into both nostrils daily.  Marland Kitchen gabapentin (NEURONTIN) 600 MG tablet TAKE 1 TABLET BY MOUTH THREE TIMES DAILY GENERIC EQUIVALENT FOR NEURONTIN  . levothyroxine (SYNTHROID, LEVOTHROID) 25 MCG tablet Take  25 mcg by mouth daily.  Marland Kitchen LINZESS 145 MCG CAPS capsule TAKE ONE CAPSULE BY MOUTH DAILY BEFORE BREAKFAST  . meclizine (ANTIVERT) 12.5 MG tablet Take 1 tablet (12.5 mg total) by mouth 3 (three) times daily as needed for dizziness.  . metoprolol succinate (TOPROL-XL) 25 MG 24 hr tablet TAKE 1 TABLET BY MOUTH TWICE A DAY  . nitrofurantoin, macrocrystal-monohydrate, (MACROBID) 100 MG capsule Take 1 capsule (100 mg total) by mouth 2 (two) times daily.  . nitroGLYCERIN (NITROSTAT) 0.4 MG SL tablet Place 1 tablet (0.4 mg total) under the tongue every 5 (five) minutes as needed for chest pain.  Marland Kitchen nystatin-triamcinolone (MYCOLOG II) cream APPLY TO CORNERS OF THE MOUTH TWICE A DAY AS NEEDED FOR CRACKING/BURNING  . omeprazole (PRILOSEC) 40 MG capsule Take 40 mg by mouth daily.  . potassium chloride SA (K-DUR,KLOR-CON) 20 MEQ tablet TAKE 1 TABLET BY MOUTH DAILY  . rosuvastatin (CRESTOR) 40 MG tablet Take 1 tablet (40 mg total) by mouth daily.  Marland Kitchen terbinafine (LAMISIL) 250 MG tablet Please take one a day x 7days, repeat every 4 weeks x 4 months  . Tetrahydrozoline HCl (VISINE OP) Place 1 drop into both eyes daily as needed (redness).  . vitamin B-12 1000 MCG tablet Take 1 tablet (1,000 mcg total) by mouth daily.  . [  DISCONTINUED] levothyroxine (SYNTHROID, LEVOTHROID) 50 MCG tablet Take 1 tablet (50 mcg total) by mouth daily.  . [DISCONTINUED] omeprazole (PRILOSEC) 40 MG capsule TAKE ONE CAPSULE BY MOUTH DAILY. NEED APPOINTMENT FOR FURTHER REFILLS (Patient taking differently: Take 40 mg by mouth daily. )     Allergies:   Amitiza [lubiprostone]; Nortriptyline; Sulfa antibiotics; Tramadol; Atorvastatin; Claritin [loratadine]; Dexamethasone; and Prednisone   Social History   Tobacco Use  . Smoking status: Former Smoker    Packs/day: 0.50    Types: Cigarettes    Last attempt to quit: 10/07/2014    Years since quitting: 4.0  . Smokeless tobacco: Never Used  Substance Use Topics  . Alcohol use: No     Alcohol/week: 0.0 standard drinks  . Drug use: No     Family Hx: The patient's family history includes Arthritis in her mother; Heart attack in her father, maternal grandfather, maternal uncle, and mother; Heart disease in her father and mother; Hyperlipidemia in her father and mother; Hypertension in her father and mother; Irritable bowel syndrome in her mother; Lung cancer in her brother; Prostate cancer in her father; Stroke (age of onset: 51) in her mother; Stroke (age of onset: 8) in her father; Thyroid disease in her father and mother. There is no history of Colon cancer, Rectal cancer, or Stomach cancer.  ROS:   Please see the history of present illness.    Review of Systems  Constitution: Positive for chills, diaphoresis and malaise/fatigue.  Hematologic/Lymphatic: Bruises/bleeds easily.  Musculoskeletal: Positive for joint pain.   All other systems reviewed and are negative.   EKGs/Labs/Other Test Reviewed:    EKG:  EKG is  ordered today.  The ekg ordered today demonstrates normal sinus rhythm, heart rate 60, normal axis, septal Q waves, QTC 408, no ST-T wave changes, similar to old EKG  Recent Labs: 10/14/2017: Magnesium 1.5 04/03/2018: ALT 8 04/10/2018: Platelets 267 05/22/2018: BUN 13; Creatinine, Ser 0.80; Hemoglobin 12.9; Potassium 3.2; Sodium 140 09/14/2018: TSH 0.36   Recent Lipid Panel Lab Results  Component Value Date/Time   CHOL 113 04/03/2018 04:03 PM   TRIG 140 04/03/2018 04:03 PM   HDL 36 (L) 04/03/2018 04:03 PM   CHOLHDL 3.1 04/03/2018 04:03 PM   CHOLHDL 9 09/26/2016 11:50 AM   LDLCALC 49 04/03/2018 04:03 PM   LDLDIRECT 214.0 09/26/2016 11:50 AM    Physical Exam:    VS:  BP 110/60   Pulse 60   Ht 5\' 3"  (1.6 m)   Wt 102 lb 12.8 oz (46.6 kg)   SpO2 94%   BMI 18.21 kg/m     Wt Readings from Last 3 Encounters:  10/09/18 102 lb 12.8 oz (46.6 kg)  09/06/18 102 lb 4.8 oz (46.4 kg)  08/24/18 102 lb 12.8 oz (46.6 kg)     Physical Exam    Constitutional: She is oriented to person, place, and time. She appears well-developed and well-nourished. No distress.  HENT:  Head: Normocephalic and atraumatic.  Eyes: No scleral icterus.  Neck: No JVD present.  Cardiovascular: Normal rate and regular rhythm.  No murmur heard. Pulmonary/Chest: Effort normal. She has no rales.  Abdominal: Soft. There is no tenderness.  Musculoskeletal: She exhibits no edema.  Neurological: She is alert and oriented to person, place, and time.  Skin: Skin is warm and dry.    ASSESSMENT & PLAN:    Coronary artery disease involving native coronary artery of native heart without angina pectoris Status post DES to the mid RCA in November 2018.  She has not had any further chest pain.  She does note continued shortness of breath with exertion.  This is unchanged.  Question if this may be related to Brilinta.  She is 1 year out from her PCI.  I will review further with Dr. Saunders Revel whether or not to stop Brilinta or change Brilinta to Clopidogrel.  Otherwise, continue aspirin, beta-blocker, rosuvastatin.  Given Dr. Darnelle Bos transition to [facility], I will have her follow-up with Dr. Burt Knack and me in this office.  Follow-up in 6 months.  Peripheral vascular disease ([place])  History of aortobifem and bilateral femoral to popliteal bypass in September 2018 and recent stenting to the superior mesenteric artery and celiac artery.  Continue follow-up with vascular surgery.  Continue aspirin, statin.  Hyperlipidemia LDL goal <70  LDL optimal on most recent lab work.  Continue current Rx.     Dispo:  Return in about 6 months (around 04/09/2019) for Routine Follow Up, w/ Dr. Burt Knack.   Medication Adjustments/Labs and Tests Ordered: Current medicines are reviewed at length with the patient today.  Concerns regarding medicines are outlined above.  Tests Ordered: Orders Placed This Encounter  Procedures  . EKG 12-Lead   Medication Changes: No orders of the defined types  were placed in this encounter.   Signed, Richardson Dopp, PA-C  10/09/2018 12:49 PM    [facility] [address], [place], [place]  [address] Phone: [phone]; Fax: [phone]

## 2018-10-09 ENCOUNTER — Encounter: Payer: Self-pay | Admitting: Physician Assistant

## 2018-10-09 ENCOUNTER — Ambulatory Visit: Payer: BLUE CROSS/BLUE SHIELD | Admitting: Physician Assistant

## 2018-10-09 VITALS — BP 110/60 | HR 60 | Ht 63.0 in | Wt 102.8 lb

## 2018-10-09 DIAGNOSIS — E785 Hyperlipidemia, unspecified: Secondary | ICD-10-CM

## 2018-10-09 DIAGNOSIS — I739 Peripheral vascular disease, unspecified: Secondary | ICD-10-CM

## 2018-10-09 DIAGNOSIS — I2510 Atherosclerotic heart disease of native coronary artery without angina pectoris: Secondary | ICD-10-CM

## 2018-10-09 NOTE — Progress Notes (Signed)
Hi Scott,  Thanks for seeing her.  Given her extensive vascular disease, I think she probably needs either long-term dual antiplatelet therapy or aspirin plus rivaroxaban 2.5 mg twice daily.  I will be honest, I have never prescribed the low-dose rivaroxaban, though it seems to have the most benefit in patients with coronary and vascular disease.  If cost is not an issue for her, that might be a reasonable option.  Otherwise, I would favor doing aspirin and clopidogrel indefinitely.  Let me know what you think.  Gerald Stabs

## 2018-10-09 NOTE — Patient Instructions (Addendum)
Medication Instructions:  Your physician recommends that you continue on your current medications as directed. Please refer to the Current Medication list given to you today.  If you need a refill on your cardiac medications before your next appointment, please call your pharmacy.   Lab work: NONE If you have labs (blood work) drawn today and your tests are completely normal, you will receive your results only by: Marland Kitchen MyChart Message (if you have MyChart) OR . A paper copy in the mail If you have any lab test that is abnormal or we need to change your treatment, we will call you to review the results.  Testing/Procedures: NONE  Follow-Up: At [facility], you and your health needs are our priority.  As part of our continuing mission to provide you with exceptional heart care, we have created designated Provider Care Teams.  These Care Teams include your primary Cardiologist (physician) and Advanced Practice Providers (APPs -  Physician Assistants and Nurse Practitioners) who all work together to provide you with the care you need, when you need it. You will need a follow up appointment in:  6 months.  Please call our office 2 months in advance to schedule this appointment.  You may see Dr. Burt Knack or one of the following Advanced Practice Providers on your designated Care Team: Richardson Dopp, PA-C [place], [place] . Daune Perch, NP  Any Other Special Instructions Will Be Listed Below (If Applicable).

## 2018-10-10 ENCOUNTER — Telehealth: Payer: Self-pay | Admitting: Physician Assistant

## 2018-10-10 MED ORDER — RIVAROXABAN 2.5 MG PO TABS: 2.5000 mg | ORAL_TABLET | Freq: Two times a day (BID) | ORAL | 11 refills | Status: DC

## 2018-10-10 NOTE — Telephone Encounter (Signed)
-----   Message from Nelva Bush, MD sent at 10/09/2018  4:42 PM EST -----   ----- Message ----- From: Sharmon Revere Sent: 10/09/2018  12:49 PM EST To: Nelva Bush, MD  Gerald Stabs - She will be 1 year out from PCI next week.  Should we DC Brilinta and continue ASA alone or should we change Brilinta to Plavix given her other vascular dz? Thanks [employer]

## 2018-10-10 NOTE — Telephone Encounter (Signed)
I called patient reviewed options of continuing long term dual antiplatelet Rx with ASA and Plavix (once she is off of Brilinta) vs taking ASA and Rivaroxaban 2.5 mg Twice daily given the results of the COMPASS trial.  We discussed the risks and benefits of taking a medication like Rivaroxaban.  She would like to try to take the Rivaroxaban option, as long as her insurance will cover it.  PLAN:  1. [employer] and stop 2. Then start Xarelto 2.5 mg Twice daily and take with ASA 81 mg Once daily  3. If insurance will not cover, she will call back.  At that point, I will Rx Plavix 75 mg Once daily to be taken long term with ASA 81 mg Once daily. Richardson Dopp, PA-C    10/10/2018 2:13 PM

## 2018-10-17 ENCOUNTER — Telehealth: Payer: Self-pay | Admitting: *Deleted

## 2018-10-17 NOTE — Telephone Encounter (Signed)
Reviewing chart for procedure on 10/24/18. Patient recently went to cardiologist, is to finish Brillinta and then start Xarelto. No documentation of contact between cardiology and GI regarding holding anticoagulant.Please advise

## 2018-10-17 NOTE — Telephone Encounter (Addendum)
Called patient to verify anticoagulant presently being used. No answer at home#,mobile# or husband#.  Dr Carlean Purl spoke with Dr END, cardiology. Who agreed with holding anticoagulant, Brillinta 5 days or xarelto 2 days.And continue the ASA.

## 2018-10-24 ENCOUNTER — Ambulatory Visit (AMBULATORY_SURGERY_CENTER): Payer: BLUE CROSS/BLUE SHIELD | Admitting: Gastroenterology

## 2018-10-24 ENCOUNTER — Encounter: Payer: Self-pay | Admitting: Gastroenterology

## 2018-10-24 VITALS — BP 134/77 | HR 91 | Temp 98.0°F | Resp 13 | Ht 63.0 in | Wt 102.0 lb

## 2018-10-24 DIAGNOSIS — R1310 Dysphagia, unspecified: Secondary | ICD-10-CM

## 2018-10-24 DIAGNOSIS — D125 Benign neoplasm of sigmoid colon: Secondary | ICD-10-CM

## 2018-10-24 DIAGNOSIS — K635 Polyp of colon: Secondary | ICD-10-CM

## 2018-10-24 DIAGNOSIS — D124 Benign neoplasm of descending colon: Secondary | ICD-10-CM | POA: Diagnosis not present

## 2018-10-24 DIAGNOSIS — Z86010 Personal history of colonic polyps: Secondary | ICD-10-CM

## 2018-10-24 DIAGNOSIS — D123 Benign neoplasm of transverse colon: Secondary | ICD-10-CM

## 2018-10-24 DIAGNOSIS — Z860101 Personal history of adenomatous and serrated colon polyps: Secondary | ICD-10-CM

## 2018-10-24 MED ORDER — SODIUM CHLORIDE 0.9 % IV SOLN: 500.0000 mL | Freq: Once | INTRAVENOUS | Status: DC

## 2018-10-24 NOTE — Progress Notes (Signed)
Report to PACU, RN, vss, BBS= Clear.  

## 2018-10-24 NOTE — Patient Instructions (Addendum)
Handouts Provided:  Polyps  RESUME Xarelto in 3 days at prior dose.  NO aspirin, ibuprofen, naproxen, or other non-steroidal anti-inflammatory drugs for 2 weeks after polyp removal.   YOU HAD AN ENDOSCOPIC PROCEDURE TODAY AT [place]:   Refer to the procedure report that was given to you for any specific questions about what was found during the examination.  If the procedure report does not answer your questions, please call your gastroenterologist to clarify.  If you requested that your care partner not be given the details of your procedure findings, then the procedure report has been included in a sealed envelope for you to review at your convenience later.  YOU SHOULD EXPECT: Some feelings of bloating in the abdomen. Passage of more gas than usual.  Walking can help get rid of the air that was put into your GI tract during the procedure and reduce the bloating. If you had a lower endoscopy (such as a colonoscopy or flexible sigmoidoscopy) you may notice spotting of blood in your stool or on the toilet paper. If you underwent a bowel prep for your procedure, you may not have a normal bowel movement for a few days.  Please Note:  You might notice some irritation and congestion in your nose or some drainage.  This is from the oxygen used during your procedure.  There is no need for concern and it should clear up in a day or so.  SYMPTOMS TO REPORT IMMEDIATELY:   Following lower endoscopy (colonoscopy or flexible sigmoidoscopy):  Excessive amounts of blood in the stool  Significant tenderness or worsening of abdominal pains  Swelling of the abdomen that is new, acute  Fever of 100F or higher   Following upper endoscopy (EGD)  Vomiting of blood or coffee ground material  New chest pain or pain under the shoulder blades  Painful or persistently difficult swallowing  New shortness of breath  Fever of 100F or higher  Black, tarry-looking stools  For urgent or emergent  issues, a gastroenterologist can be reached at any hour by calling [phone].   DIET:  We do recommend a small meal at first, but then you may proceed to your regular diet.  Drink plenty of fluids but you should avoid alcoholic beverages for 24 hours.  ACTIVITY:  You should plan to take it easy for the rest of today and you should NOT DRIVE or use heavy machinery until tomorrow (because of the sedation medicines used during the test).    FOLLOW UP: Our staff will call the number listed on your records the next business day following your procedure to check on you and address any questions or concerns that you may have regarding the information given to you following your procedure. If we do not reach you, we will leave a message.  However, if you are feeling well and you are not experiencing any problems, there is no need to return our call.  We will assume that you have returned to your regular daily activities without incident.  If any biopsies were taken you will be contacted by phone or by letter within the next 1-3 weeks.  Please call us at [phone] if you have not heard about the biopsies in 3 weeks.    SIGNATURES/CONFIDENTIALITY: You and/or your care partner have signed paperwork which will be entered into your electronic medical record.  These signatures attest to the fact that that the information above on your After Visit Summary has been reviewed  and is understood.  Full responsibility of the confidentiality of this discharge information lies with you and/or your care-partner.

## 2018-10-24 NOTE — Progress Notes (Signed)
Called to room to assist during endoscopic procedure.  Patient ID and intended procedure confirmed with present staff. Received instructions for my participation in the procedure from the performing physician.  

## 2018-10-25 ENCOUNTER — Other Ambulatory Visit: Payer: Self-pay

## 2018-10-25 ENCOUNTER — Telehealth: Payer: Self-pay

## 2018-10-25 DIAGNOSIS — R1310 Dysphagia, unspecified: Secondary | ICD-10-CM

## 2018-10-25 NOTE — Telephone Encounter (Signed)
  Follow up Call-  Call back number 10/24/2018  Post procedure Call Back phone  # [phone]  Permission to leave phone message Yes  Some recent data might be hidden     Patient questions:  Do you have a fever, pain , or abdominal swelling? Yes.   Pain Score  2 *  Have you tolerated food without any problems? No. Patient has not eaten solid foods yet d/t a sore throat. Liquids are fine, I encouraged her to eat some soft foods. SHe will notify us if there are any problems doing this.   Have you been able to return to your normal activities? Yes.    Do you have any questions about your discharge instructions: Diet   No. Medications  No. Follow up visit  No.  Do you have questions or concerns about your Care? No.  Actions: * If pain score is 4 or above: No action needed, pain <4.

## 2018-10-25 NOTE — Op Note (Signed)
[facility] Patient Name: Erin [name] Procedure Date: 10/24/2018 2:23 PM MRN: [id number] Endoscopist: Ladene Artist , MD Age: 62 Referring MD:  Date of Birth: [birth year] Gender: Female Account #: [id number] Procedure:                Upper GI endoscopy Indications:              Dysphagia Medicines:                Monitored Anesthesia Care Procedure:                Pre-Anesthesia Assessment:                           - Prior to the procedure, a History and Physical                            was performed, and patient medications and                            allergies were reviewed. The patient's tolerance of                            previous anesthesia was also reviewed. The risks                            and benefits of the procedure and the sedation                            options and risks were discussed with the patient.                            All questions were answered, and informed consent                            was obtained. Prior Anticoagulants: The patient has                            taken antiplatelet medication, Brilinta, last dose                            was 5 days prior to procedure. ASA Grade                            Assessment: III - A patient with severe systemic                            disease. After reviewing the risks and benefits,                            the patient was deemed in satisfactory condition to                            undergo the procedure.  After obtaining informed consent, the endoscope was                            passed under direct vision. Throughout the                            procedure, the patient's blood pressure, pulse, and                            oxygen saturations were monitored continuously. The                            Endoscope was introduced through the mouth, and                            advanced to the second part of duodenum. The upper                GI endoscopy was accomplished without difficulty.                            The patient tolerated the procedure well. Scope In: Scope Out: Findings:                 The esophagus was normal.                           The stomach was normal.                           The examined duodenum was normal.                           The cardia and gastric fundus were normal on                            retroflexion. Complications:            No immediate complications. Estimated Blood Loss:     Estimated blood loss: none. Impression:               - Normal esophagus.                           - Normal stomach.                           - Normal examined duodenum.                           - No specimens collected. Recommendation:           - Patient has a contact number available for                            emergencies. The signs and symptoms of potential                            delayed complications were discussed with the  patient. Return to normal activities tomorrow.                            Written discharge instructions were provided to the                            patient.                           - Resume previous diet.                           - Continue present medications.                           - Perform a barium swallow using barium in liquid                            and tablet form at appointment to be scheduled. Ladene Artist, MD 10/24/2018 3:38:56 PM This report has been signed electronically.

## 2018-10-25 NOTE — Op Note (Signed)
[facility] Patient Name: Erin [name] Procedure Date: 10/24/2018 2:24 PM MRN: [id number] Endoscopist: Ladene Artist , MD Age: 62 Referring MD:  Date of Birth: 1956-10-25 Gender: Female Account #: [id number] Procedure:                Colonoscopy Indications:              Surveillance: Personal history of adenomatous                            polyps on last colonoscopy 5 years ago Medicines:                Monitored Anesthesia Care Procedure:                Pre-Anesthesia Assessment:                           - Prior to the procedure, a History and Physical                            was performed, and patient medications and                            allergies were reviewed. The patient's tolerance of                            previous anesthesia was also reviewed. The risks                            and benefits of the procedure and the sedation                            options and risks were discussed with the patient.                            All questions were answered, and informed consent                            was obtained. Prior Anticoagulants: The patient has                            taken antiplatelet medication, Brilinta, last dose                            was 5 days prior to procedure. ASA Grade                            Assessment: III - A patient with severe systemic                            disease. After reviewing the risks and benefits,                            the patient was deemed in satisfactory condition to  undergo the procedure.                           After obtaining informed consent, the colonoscope                            was passed under direct vision. Throughout the                            procedure, the patient's blood pressure, pulse, and                            oxygen saturations were monitored continuously. The                            Colonoscope was introduced through the anus  and                            advanced to the the cecum, identified by                            appendiceal orifice and ileocecal valve. The                            ileocecal valve, appendiceal orifice, and rectum                            were photographed. The quality of the bowel                            preparation was adequate after extensive lavage and                            suctioning. The patient tolerated the procedure                            well. The colonoscopy was somewhat difficult due to                            restricted mobility of the colon, significant                            looping and a tortuous colon. Successful completion                            of the procedure was aided by using manual                            pressure, withdrawing and reinserting the scope,                            straightening and shortening the scope to obtain  bowel loop reduction, using scope torsion and                            lavage. Scope In: 2:29:59 PM Scope Out: 3:11:40 PM Scope Withdrawal Time: 0 hours 34 minutes 9 seconds  Total Procedure Duration: 0 hours 41 minutes 41 seconds  Findings:                 The perianal and digital rectal examinations were                            normal.                           A 25 mm polyp was found in the transverse colon.                            The polyp was sessile. The polyp was removed with a                            piecemeal technique using a hot snare. Resection                            and retrieval were complete. Two areas were                            tattooed with an injection of 4 mL of Spot (carbon                            black).                           Four sessile polyps were found in the transverse                            colon. The polyps were 10 to 12 mm in size. These                            polyps were removed with a hot snare. Resection and                             retrieval were complete.                           Five sessile polyps were found in the sigmoid colon                            (1), descending colon (3) and transverse colon (1).                            The polyps were 7 to 8 mm in size. These polyps                            were removed with a cold  snare. Resection and                            retrieval were complete.                           A 4 mm polyp was found in the transverse colon. The                            polyp was sessile. The polyp was removed with a                            cold biopsy forceps. Resection and retrieval were                            complete.                           Patchy areas of mildly erythematous mucosa was                            found in the distal rectum, anal canal.                           Internal hemorrhoids were found. The hemorrhoids                            were small and Grade I (internal hemorrhoids that                            do not prolapse).                           The exam was otherwise without abnormality on                            direct views. Unable to retroflex due to a narrow                            rectal vault. Complications:            No immediate complications. Estimated blood loss:                            None. Estimated Blood Loss:     Estimated blood loss: none. Impression:               - One 25 mm polyp in the transverse colon, removed                            piecemeal using a hot snare. Resected and                            retrieved. Tattooed.                           -  Four 10 to 12 mm polyps in the transverse colon,                            removed with a hot snare. Resected and retrieved.                           - Five 7 to 8 mm polyps in the sigmoid colon, in                            the descending colon and in the transverse colon,                            removed with a cold snare.  Resected and retrieved.                           - One 4 mm polyp in the transverse colon, removed                            with a cold biopsy forceps. Resected and retrieved.                           - Erythematous mucosa in the distal rectumm anal                            canal.                           - Small internal hemorrhoids.                           - The examination was otherwise normal on direct                            views. Recommendation:           - Repeat colonoscopy in 6 months for surveillance                            after piecemeal polypectomy.                           - Resume Xarelto (rivaroxaban) in 3 days at prior                            dose. Refer to managing physician for further                            adjustment of therapy.                           - Patient has a contact number available for                            emergencies. The signs and symptoms of potential  delayed complications were discussed with the                            patient. Return to normal activities tomorrow.                            Written discharge instructions were provided to the                            patient.                           - Resume previous diet.                           - Continue present medications.                           - Await pathology results.                           - No aspirin, ibuprofen, naproxen, or other                            non-steroidal anti-inflammatory drugs for 2 weeks                            after polyp removal. Ladene Artist, MD 10/24/2018 3:35:37 PM This report has been signed electronically.

## 2018-10-25 NOTE — Progress Notes (Signed)
Patient scheduled for Barium swallow on 11/09/18 at 9:30am. Patient notified and verbalized understanding.

## 2018-10-30 ENCOUNTER — Other Ambulatory Visit: Payer: Self-pay | Admitting: Internal Medicine

## 2018-10-30 ENCOUNTER — Encounter: Payer: Self-pay | Admitting: Gastroenterology

## 2018-10-30 NOTE — Telephone Encounter (Signed)
Refill Request.  

## 2018-10-31 ENCOUNTER — Other Ambulatory Visit: Payer: Self-pay | Admitting: Family Medicine

## 2018-10-31 NOTE — Telephone Encounter (Signed)
Copied from [place] [id number]. Topic: Quick Communication - Rx Refill/Question >> Oct 31, 2018  2:39 PM Bea Graff, NT wrote: Medication: omeprazole (PRILOSEC) 40 MG capsule 90 day  Has the patient contacted their pharmacy? Yes.   (Agent: If no, request that the patient contact the pharmacy for the refill.) (Agent: If yes, when and what did the pharmacy advise?)  Preferred Pharmacy (with phone number or street name): [id number] - [place], [place] - [address] [phone] (Phone) [phone] (Fax)    Agent: Please be advised that RX refills may take up to 3 business days. We ask that you follow-up with your pharmacy.

## 2018-11-01 NOTE — Telephone Encounter (Signed)
Requested medication (s) are due for refill today: yes  Requested medication (s) are on the active medication list: yes  Last refill:  Last refilled by historical provider  Future visit scheduled: yes  Notes to clinic:  Medication previously filled by historical provider    Requested Prescriptions  Pending Prescriptions Disp Refills   omeprazole (PRILOSEC) 40 MG capsule      Sig: Take 1 capsule (40 mg total) by mouth daily.     Gastroenterology: Proton Pump Inhibitors Passed - 10/31/2018  3:51 PM      Passed - Valid encounter within last 12 months    Recent Outpatient Visits          1 month ago [place] [facility], [place], DO   2 months ago Dysphagia, unspecified type   [facility], [place], DO   3 months ago Type 2 diabetes mellitus without complication, without long-term current use of insulin ([place])   [facility], [place], DO   6 months ago Type 2 diabetes mellitus without complication, without long-term current use of insulin ([place])   [facility], [place], DO   9 months ago [place] [facility], [place]ola [place], DO      Future Appointments            In 2 weeks Maudie Mercury, Nickola Major, [place] [facility], [facility]

## 2018-11-07 ENCOUNTER — Telehealth: Payer: Self-pay | Admitting: Gastroenterology

## 2018-11-07 NOTE — Telephone Encounter (Signed)
Pt called in and stated that she needs to cancel and reschedule proc on 11/09/2018

## 2018-11-07 NOTE — Telephone Encounter (Signed)
Called and spoke with patient's spouse ([name])-[name] was aware that patient called to reschedule appt on Friday 12/20; Eduard Clos was given the number [phone] and advised him to have the patient to call and reschedule appt at her convenience; patient's spouse, Eduard Clos, was also advised to call back if questions/concerns arise;

## 2018-11-09 ENCOUNTER — Ambulatory Visit (HOSPITAL_COMMUNITY): Payer: BLUE CROSS/BLUE SHIELD

## 2018-11-19 ENCOUNTER — Ambulatory Visit: Payer: BLUE CROSS/BLUE SHIELD | Admitting: Family Medicine

## 2018-11-20 ENCOUNTER — Ambulatory Visit (HOSPITAL_COMMUNITY)
Admission: RE | Admit: 2018-11-20 | Discharge: 2018-11-20 | Disposition: A | Discharge status: Home / Self Care | Payer: BLUE CROSS/BLUE SHIELD | Source: Ambulatory Visit | Attending: Gastroenterology | Admitting: Gastroenterology

## 2018-11-20 DIAGNOSIS — R1310 Dysphagia, unspecified: Secondary | ICD-10-CM

## 2018-11-21 ENCOUNTER — Encounter: Payer: Self-pay | Admitting: Family Medicine

## 2018-11-21 NOTE — Progress Notes (Deleted)
HPI:  Using dictation device. Unfortunately this device frequently misinterprets words/phrases.  Erin [name] is a delightful 63 y.o. here for follow up. Chronic medical problems summarized below were reviewed for changes and stability and were updated as needed below. These issues and their treatment remain stable for the most part. ***. Denies CP, SOB, DOE, treatment intolerance or new symptoms. Due for labs  Hyperglycemia/borderline: -dx in 2014, well controlled -no meds  Hypothyroidism: -meds: levothyroxine  IBS/GERD: -sees GI  Hx of anal cancer: -managed by GI (Dr. Fuller Plan) and oncology (Dr. Benay Spice)  Hx CAD, cardiomyopathy, peripheral vascular disease: -sees cardiology (Dr. Saunders Revel --> Dr. Burt Knack) and vasc surgery (Dr. 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 early for this, underwent stenting with Dr. Trula Slade in 06/2018  Hypokalemia: -hx of -takes potassium -did not tol acei/arb   Depression/underweight/neuropathy/pain: -long hx neuropathy, sees neurology -had longstanding pain after vascular surgery -cymbalta helped all of the above -also on gabapentin -takes b12 for neuropathy, b12 def  Hx cold sores: -uses acyclovir episodically  ROS: See pertinent positives and negatives per HPI.  Past Medical History:  Diagnosis Date  . Allergy   . Alopecia   . 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 Anxiety   . 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 B12 deficiency anemia 09/14/2015  . Blood transfusion without reported diagnosis   . BPPV (benign paroxysmal positional vertigo) 07/08/2015  . Cardiomyopathy ([place]) 11/03/2017  . Chronic back pain   . Chronic daily headache 03/29/2013   takes bc powder  . Closed right  hip fracture ([place]) 09/10/2015  . Coronary artery disease involving native coronary artery of native heart without angina pectoris 10/13/2017   DES to mid RCA  . Depression   . DM (diabetes mellitus) ([place])    diet controlled; does not check BG  . Essential (hemorrhagic) thrombocythemia ([place]) 09/06/2018  . Family history of early CAD 04/08/2016  . GERD (gastroesophageal reflux disease)   . History of hiatal hernia   . Hot flashes   . Hyperlipidemia associated with type 2 diabetes mellitus ([place]) 05/11/2017  . Hypothyroidism 09/10/2015  . IBS (irritable bowel syndrome) 03/29/2013  . Neck pain 07/08/2015  . 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 Peripheral vascular disease ([place]) 11/03/2017  . QT prolongation   . Sacroiliitis ([place]) 09/06/2018  . Spasms of the hands or feet 10/08/2017  . Syncope 06/07/2016  . Tubular adenoma of colon 09/08/2003  . Vertigo   . Wears glasses     Past Surgical History:  Procedure Laterality Date  . ABDOMINAL AORTOGRAM N/A 08/02/2017   Procedure: ABDOMINAL AORTOGRAM;  Surgeon: Wellington Hampshire, MD;  Location: [facility];  Service: Cardiovascular;  Laterality: N/A;  . AORTA - BILATERAL FEMORAL ARTERY BYPASS GRAFT N/A 08/14/2017   Procedure: AORTA BIFEMORAL BYPASS GRAFT;  Surgeon: Rosetta Posner, MD;  Location: [facility];  Service: Vascular;  Laterality: N/A;  . COLONOSCOPY    . CORONARY STENT INTERVENTION N/A 10/13/2017   Procedure: CORONARY STENT INTERVENTION;  Surgeon: Nelva Bush, MD;  Location: [facility];  Service: Cardiovascular;  Laterality: N/A;  . dental implant    . ECTOPIC PREGNANCY SURGERY    . EMBOLECTOMY N/A 08/14/2017   Procedure: EMBOLECTOMY FEMORAL;  Surgeon: Donnetta Hutching,  Arvilla Meres, MD;  Location: [facility];  Service: Vascular;  Laterality: N/A;  . FEMORAL-POPLITEAL BYPASS GRAFT Right 08/14/2017   Procedure: Right Femoral to Above Knee Popliteal Bypass Graft using Non-Reversed Greater Saphenous Vein Graft from Right Leg;  Surgeon: Rosetta Posner, MD;  Location: [facility];  Service: Vascular;  Laterality: Right;  . FLEXIBLE SIGMOIDOSCOPY N/A 08/14/2013   Procedure: FLEXIBLE SIGMOIDOSCOPY;  Surgeon: Ladene Artist, MD;  Location: [facility];  Service: Endoscopy;  Laterality: N/A;  . LEFT HEART CATH AND CORONARY ANGIOGRAPHY N/A 10/13/2017   Procedure: LEFT HEART CATH AND CORONARY ANGIOGRAPHY;  Surgeon: Nelva Bush, MD;  Location: [facility];  Service: Cardiovascular;  Laterality: N/A;  . LOWER EXTREMITY ANGIOGRAPHY Bilateral 08/02/2017   Procedure: Lower Extremity Angiography;  Surgeon: Wellington Hampshire, MD;  Location: [facility];  Service: Cardiovascular;  Laterality: Bilateral;  . MULTIPLE TOOTH EXTRACTIONS    . PERIPHERAL VASCULAR INTERVENTION  05/22/2018   Procedure: PERIPHERAL VASCULAR INTERVENTION;  Surgeon: Serafina Mitchell, MD;  Location: [facility];  Service: Cardiovascular;;  SMA and Celiac  . PILONIDAL CYST EXCISION    . POLYPECTOMY    . THROMBECTOMY FEMORAL ARTERY Right 08/14/2017   Procedure: THROMBECTOMY FEMORAL ARTERY;  Surgeon: Rosetta Posner, MD;  Location: [facility];  Service: Vascular;  Laterality: Right;  . TONSILLECTOMY    . TOTAL HIP ARTHROPLASTY  09/11/2015   Procedure: TOTAL HIP ARTHROPLASTY;  Surgeon: Renette Butters, MD;  Location: [facility];  Service: Orthopedics;;  . ULTRASOUND GUIDANCE FOR VASCULAR ACCESS  10/13/2017   Procedure: Ultrasound Guidance For Vascular Access;  Surgeon: Nelva Bush, MD;  Location: [facility];  Service: Cardiovascular;;    Family History  Problem Relation Age of Onset  . Arthritis Mother   . Hyperlipidemia Mother   . Heart disease Mother   . Hypertension Mother   . Stroke Mother 90  . Irritable bowel syndrome Mother   . Thyroid disease Mother   . Heart attack Mother   . Heart disease Father   . Hyperlipidemia Father   . Hypertension Father   . Stroke Father 39  . Thyroid disease Father   . Prostate cancer Father   . Heart attack  Father   . Lung cancer Brother   . Heart attack Maternal Grandfather   . Heart attack Maternal Uncle   . Colon cancer Neg Hx   . Rectal cancer Neg Hx   . Stomach cancer Neg Hx   . Esophageal cancer Neg Hx     SOCIAL HX: ***   Current Outpatient Medications:  .  acetaminophen (TYLENOL) 500 MG tablet, Take 1,000 mg by mouth daily as needed for headache (pain). , Disp: , Rfl:  .  acyclovir ointment (ZOVIRAX) 5 %, Apply 1 application topically every 3 (three) hours as needed (fever blisters)., Disp: 15 g, Rfl: 3 .  aspirin EC 81 MG tablet, Take 81 mg by mouth daily., Disp: , Rfl:  .  Cholecalciferol (VITAMIN D) 2000 units tablet, Take 2,000 Units by mouth daily., Disp: , Rfl:  .  dicyclomine (BENTYL) 10 MG capsule, TAKE ONE CAPSULE BY MOUTH THREE TIMES DAILY BEFORE MEALS., Disp: 270 capsule, Rfl: 1 .  DULoxetine (CYMBALTA) 20 MG capsule, Take 2 capsules daily, Disp: 180 capsule, Rfl: 3 .  fluticasone (FLONASE) 50 MCG/ACT nasal spray, Place 1 spray into both nostrils daily., Disp: 48 g, Rfl: 1 .  gabapentin (NEURONTIN) 600 MG tablet, TAKE 1 TABLET BY  MOUTH THREE TIMES DAILY GENERIC EQUIVALENT FOR NEURONTIN, Disp: 270 tablet, Rfl: 0 .  levothyroxine (SYNTHROID, LEVOTHROID) 25 MCG tablet, Take 25 mcg by mouth daily., Disp: , Rfl:  .  levothyroxine (SYNTHROID, LEVOTHROID) 50 MCG tablet, TAKE 1 TABLET BY MOUTH EVERY DAY, Disp: 90 tablet, Rfl: 1 .  LINZESS 145 MCG CAPS capsule, TAKE ONE CAPSULE BY MOUTH DAILY BEFORE BREAKFAST, Disp: 90 capsule, Rfl: 2 .  meclizine (ANTIVERT) 12.5 MG tablet, Take 1 tablet (12.5 mg total) by mouth 3 (three) times daily as needed for dizziness., Disp: 30 tablet, Rfl: 6 .  metoprolol succinate (TOPROL-XL) 25 MG 24 hr tablet, TAKE 1 TABLET BY MOUTH TWICE A DAY, Disp: 180 tablet, Rfl: 1 .  nitrofurantoin, macrocrystal-monohydrate, (MACROBID) 100 MG capsule, Take 1 capsule (100 mg total) by mouth 2 (two) times daily. (Patient not taking: Reported on 10/24/2018), Disp: 14  capsule, Rfl: 0 .  nitroGLYCERIN (NITROSTAT) 0.4 MG SL tablet, Place 1 tablet (0.4 mg total) under the tongue every 5 (five) minutes as needed for chest pain., Disp: 100 tablet, Rfl: 1 .  nystatin-triamcinolone (MYCOLOG II) cream, APPLY TO CORNERS OF THE MOUTH TWICE A DAY AS NEEDED FOR CRACKING/BURNING, Disp: , Rfl: 2 .  omeprazole (PRILOSEC) 40 MG capsule, Take 40 mg by mouth daily., Disp: , Rfl:  .  potassium chloride SA (K-DUR,KLOR-CON) 20 MEQ tablet, TAKE 1 TABLET BY MOUTH DAILY, Disp: 90 tablet, Rfl: 1 .  rivaroxaban (XARELTO) 2.5 MG TABS tablet, Take 1 tablet (2.5 mg total) by mouth 2 (two) times daily. (Patient not taking: Reported on 10/24/2018), Disp: 60 tablet, Rfl: 11 .  rosuvastatin (CRESTOR) 40 MG tablet, TAKE 1 TABLET BY MOUTH EVERY DAY, Disp: 90 tablet, Rfl: 3 .  terbinafine (LAMISIL) 250 MG tablet, Please take one a day x 7days, repeat every 4 weeks x 4 months (Patient not taking: Reported on 10/24/2018), Disp: 28 tablet, Rfl: 0 .  Tetrahydrozoline HCl (VISINE OP), Place 1 drop into both eyes daily as needed (redness)., Disp: , Rfl:  .  vitamin B-12 1000 MCG tablet, Take 1 tablet (1,000 mcg total) by mouth daily., Disp: 30 tablet, Rfl: 0  EXAM:  There were no vitals filed for this visit.  There is no height or weight on file to calculate BMI.  GENERAL: vitals reviewed and listed above, alert, oriented, appears well hydrated and in no acute distress  HEENT: atraumatic, conjunttiva clear, no obvious abnormalities on inspection of external nose and ears  NECK: no obvious masses on inspection  LUNGS: clear to auscultation bilaterally, no wheezes, rales or rhonchi, good air movement  CV: HRRR, no peripheral edema  MS: moves all extremities without noticeable abnormality *** PSYCH: pleasant and cooperative, no obvious depression or anxiety  ASSESSMENT AND PLAN:  Discussed the following assessment and plan:  No diagnosis found.  *** -Patient advised to return or notify a  doctor immediately if symptoms worsen or persist or new concerns arise.  There are no Patient Instructions on file for this visit.  Lucretia Kern, DO

## 2018-11-22 ENCOUNTER — Ambulatory Visit: Payer: BLUE CROSS/BLUE SHIELD | Admitting: Family Medicine

## 2018-11-22 ENCOUNTER — Other Ambulatory Visit: Payer: Self-pay | Admitting: *Deleted

## 2018-11-22 ENCOUNTER — Telehealth: Payer: Self-pay

## 2018-11-22 MED ORDER — GABAPENTIN 600 MG PO TABS: ORAL_TABLET | ORAL | 1 refills | Status: DC

## 2018-11-22 NOTE — Telephone Encounter (Signed)
Rx done. 

## 2018-11-22 NOTE — Telephone Encounter (Signed)
Copied from [place] [id number]. Topic: Quick Communication - Appointment Cancellation >> Nov 22, 2018 10:38 AM Antonieta Iba C wrote: Patient called to cancel appointment scheduled for TODAY. 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   Route to department's PEC pool.

## 2018-11-23 ENCOUNTER — Other Ambulatory Visit: Payer: Self-pay

## 2018-11-23 ENCOUNTER — Other Ambulatory Visit: Payer: Self-pay | Admitting: *Deleted

## 2018-11-23 DIAGNOSIS — K551 Chronic vascular disorders of intestine: Secondary | ICD-10-CM

## 2018-11-26 ENCOUNTER — Other Ambulatory Visit: Payer: Self-pay | Admitting: *Deleted

## 2018-11-26 MED ORDER — POTASSIUM CHLORIDE CRYS ER 20 MEQ PO TBCR: 20.0000 meq | EXTENDED_RELEASE_TABLET | Freq: Every day | ORAL | 1 refills | Status: DC

## 2018-11-26 NOTE — Telephone Encounter (Signed)
Rx done. 

## 2018-11-29 ENCOUNTER — Encounter: Payer: Self-pay | Admitting: Family Medicine

## 2018-11-29 ENCOUNTER — Ambulatory Visit (INDEPENDENT_AMBULATORY_CARE_PROVIDER_SITE_OTHER): Payer: PRIVATE HEALTH INSURANCE

## 2018-11-29 ENCOUNTER — Ambulatory Visit: Payer: PRIVATE HEALTH INSURANCE | Admitting: Family Medicine

## 2018-11-29 VITALS — BP 90/58 | HR 98 | Temp 97.7°F | Ht 63.0 in | Wt 103.2 lb

## 2018-11-29 DIAGNOSIS — I5181 Takotsubo syndrome: Secondary | ICD-10-CM

## 2018-11-29 DIAGNOSIS — E039 Hypothyroidism, unspecified: Secondary | ICD-10-CM

## 2018-11-29 DIAGNOSIS — I7409 Other arterial embolism and thrombosis of abdominal aorta: Secondary | ICD-10-CM

## 2018-11-29 DIAGNOSIS — E119 Type 2 diabetes mellitus without complications: Secondary | ICD-10-CM

## 2018-11-29 DIAGNOSIS — M79672 Pain in left foot: Secondary | ICD-10-CM | POA: Diagnosis not present

## 2018-11-29 DIAGNOSIS — I739 Peripheral vascular disease, unspecified: Secondary | ICD-10-CM

## 2018-11-29 DIAGNOSIS — E785 Hyperlipidemia, unspecified: Secondary | ICD-10-CM

## 2018-11-29 DIAGNOSIS — E1169 Type 2 diabetes mellitus with other specified complication: Secondary | ICD-10-CM

## 2018-11-29 DIAGNOSIS — E876 Hypokalemia: Secondary | ICD-10-CM | POA: Diagnosis not present

## 2018-11-29 DIAGNOSIS — I2510 Atherosclerotic heart disease of native coronary artery without angina pectoris: Secondary | ICD-10-CM

## 2018-11-29 DIAGNOSIS — D519 Vitamin B12 deficiency anemia, unspecified: Secondary | ICD-10-CM | POA: Diagnosis not present

## 2018-11-29 LAB — CBC
HCT: 36 % (ref 36.0–46.0)
Hemoglobin: 11.8 g/dL — ABNORMAL LOW (ref 12.0–15.0)
MCHC: 32.9 g/dL (ref 30.0–36.0)
MCV: 96.6 fl (ref 78.0–100.0)
Platelets: 244 10*3/uL (ref 150.0–400.0)
RBC: 3.72 Mil/uL — ABNORMAL LOW (ref 3.87–5.11)
RDW: 13.6 % (ref 11.5–15.5)
WBC: 5.4 10*3/uL (ref 4.0–10.5)

## 2018-11-29 LAB — BASIC METABOLIC PANEL
BUN: 14 mg/dL (ref 6–23)
CHLORIDE: 104 meq/L (ref 96–112)
CO2: 27 mEq/L (ref 19–32)
Calcium: 9.1 mg/dL (ref 8.4–10.5)
Creatinine, Ser: 0.83 mg/dL (ref 0.40–1.20)
GFR: 73.97 mL/min (ref >60.00)
Glucose, Bld: 78 mg/dL (ref 70–99)
Potassium: 3.7 mEq/L (ref 3.5–5.1)
Sodium: 139 mEq/L (ref 135–145)

## 2018-11-29 LAB — HEMOGLOBIN A1C: Hgb A1c MFr Bld: 5.5 % (ref 4.6–6.5)

## 2018-11-29 LAB — TSH: TSH: 3.6 u[IU]/mL (ref 0.35–4.50)

## 2018-11-29 LAB — VITAMIN B12: Vitamin B-12: 470 pg/mL (ref 211–911)

## 2018-11-29 NOTE — Patient Instructions (Signed)
BEFORE YOU LEAVE: -xray  -labs -follow up: 3-4 months  Xray of the foot today and may send referral pending results.  We have ordered labs or studies at this visit. It can take up to 1-2 weeks for results and processing. IF results require follow up or explanation, we will call you with instructions. Clinically stable results will be released to your [facility]. If you have not heard from [place] or cannot find your results in [facility] in 2 weeks please contact our office at [phone].  If you are not yet signed up for [facility], please consider signing up.

## 2018-11-29 NOTE — Progress Notes (Signed)
HPI:  Using dictation device. Unfortunately this device frequently misinterprets words/phrases.  *Erin [name] is a very pleasant 63 yo with a history of diabetes, significant CV disease followed by her specialist, hx anal ca - followed by specialist, hypothyroidism, b12 deficiency and more listed below here for follow up. She is due for labs today. Reports overall has been doing quite well. Her family had the flu - but she did not get it. Her only complaint today is L foot pain. Hit her foot on kitchen corner 2-3 weeks ago. Had large amount of swelling and bruising of almost her whole foot. Now much better but persistent pain.   ROS: See pertinent positives and negatives per HPI.  Past Medical History:  Diagnosis Date  . Allergy   . Alopecia   . 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 Anxiety   . 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 B12 deficiency anemia 09/14/2015  . Blood transfusion without reported diagnosis   . BPPV (benign paroxysmal positional vertigo) 07/08/2015  . Cardiomyopathy ([place]) 11/03/2017  . Chronic back pain   . Chronic daily headache 03/29/2013   takes bc powder  . Closed right hip fracture ([place]) 09/10/2015  . Coronary artery disease involving native coronary artery of native heart without angina pectoris 10/13/2017   DES to mid RCA  . Depression   . DM (diabetes mellitus) ([place])    diet controlled; does not check BG  . Essential (hemorrhagic) thrombocythemia ([place]) 09/06/2018  . Family history of early CAD 04/08/2016  . GERD (gastroesophageal reflux disease)   . History of hiatal hernia   . Hot flashes   . Hyperlipidemia associated with type 2 diabetes mellitus ([place]) 05/11/2017  . Hypothyroidism 09/10/2015  . IBS (irritable bowel syndrome) 03/29/2013  . Neck pain 07/08/2015  . 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 Peripheral vascular disease ([place]) 11/03/2017  . QT  prolongation   . Sacroiliitis ([place]) 09/06/2018  . Spasms of the hands or feet 10/08/2017  . Syncope 06/07/2016  . Tubular adenoma of colon 09/08/2003  . Vertigo   . Wears glasses     Past Surgical History:  Procedure Laterality Date  . ABDOMINAL AORTOGRAM N/A 08/02/2017   Procedure: ABDOMINAL AORTOGRAM;  Surgeon: Wellington Hampshire, MD;  Location: [facility] [facility];  Service: Cardiovascular;  Laterality: N/A;  . AORTA - BILATERAL FEMORAL ARTERY BYPASS GRAFT N/A 08/14/2017   Procedure: AORTA BIFEMORAL BYPASS GRAFT;  Surgeon: Rosetta Posner, MD;  [facility];  Service: Vascular;  Laterality: N/A;  . COLONOSCOPY    . CORONARY STENT INTERVENTION N/A 10/13/2017   Procedure: CORONARY STENT INTERVENTION;  Surgeon: Nelva Bush, MD;  Location: [place];  Service: Cardiovascular;  Laterality: N/A;  . dental implant    . ECTOPIC PREGNANCY SURGERY    . EMBOLECTOMY N/A 08/14/2017   Procedure: EMBOLECTOMY FEMORAL;  Surgeon: Rosetta Posner, MD;  Location: [facility] OR;  Service: Vascular;  Laterality: N/A;  . FEMORAL-POPLITEAL BYPASS GRAFT Right 08/14/2017   Procedure: Right Femoral to Above Knee Popliteal Bypass Graft using Non-Reversed Greater Saphenous Vein Graft from Right Leg;  Surgeon: Rosetta Posner, MD;  Location: [facility];  Service: Vascular;  Laterality: Right;  . FLEXIBLE SIGMOIDOSCOPY N/A 08/14/2013   Procedure: FLEXIBLE SIGMOIDOSCOPY;  Surgeon: Ladene Artist, MD;  [facility];  Service: Endoscopy;  Laterality: N/A;  . LEFT HEART CATH AND CORONARY ANGIOGRAPHY N/A  10/13/2017   Procedure: LEFT HEART CATH AND CORONARY ANGIOGRAPHY;  Surgeon: Nelva Bush, MD;  Location: [place] [facility];  Service: Cardiovascular;  Laterality: N/A;  . LOWER EXTREMITY ANGIOGRAPHY Bilateral 08/02/2017   Procedure: Lower Extremity Angiography;  Surgeon: Wellington Hampshire, MD;  Location: [place] [facility];  Service: Cardiovascular;  Laterality: Bilateral;  . MULTIPLE TOOTH EXTRACTIONS    .  PERIPHERAL VASCULAR INTERVENTION  05/22/2018   Procedure: PERIPHERAL VASCULAR INTERVENTION;  Surgeon: Serafina Mitchell, MD;  Location: [place] [facility];  Service: Cardiovascular;;  SMA and Celiac  . PILONIDAL CYST EXCISION    . POLYPECTOMY    . THROMBECTOMY FEMORAL ARTERY Right 08/14/2017   Procedure: THROMBECTOMY FEMORAL ARTERY;  Surgeon: Rosetta Posner, MD;  Location: [facility];  Service: Vascular;  Laterality: Right;  . TONSILLECTOMY    . TOTAL HIP ARTHROPLASTY  09/11/2015   Procedure: TOTAL HIP ARTHROPLASTY;  Surgeon: Renette Butters, MD;  Location: [facility];  Service: Orthopedics;;  . ULTRASOUND GUIDANCE FOR VASCULAR ACCESS  10/13/2017   Procedure: Ultrasound Guidance For Vascular Access;  Surgeon: Nelva Bush, MD;  Location: [facility];  Service: Cardiovascular;;    Family History  Problem Relation Age of Onset  . Arthritis Mother   . Hyperlipidemia Mother   . Heart disease Mother   . Hypertension Mother   . Stroke Mother 15  . Irritable bowel syndrome Mother   . Thyroid disease Mother   . Heart attack Mother   . Heart disease Father   . Hyperlipidemia Father   . Hypertension Father   . Stroke Father 52  . Thyroid disease Father   . Prostate cancer Father   . Heart attack Father   . Lung cancer Brother   . Heart attack Maternal Grandfather   . Heart attack Maternal Uncle   . Colon cancer Neg Hx   . Rectal cancer Neg Hx   . Stomach cancer Neg Hx   . Esophageal cancer Neg Hx     SOCIAL HX: see hpi   Current Outpatient Medications:  .  acetaminophen (TYLENOL) 500 MG tablet, Take 1,000 mg by mouth daily as needed for headache (pain). , Disp: , Rfl:  .  acyclovir ointment (ZOVIRAX) 5 %, Apply 1 application topically every 3 (three) hours as needed (fever blisters)., Disp: 15 g, Rfl: 3 .  aspirin EC 81 MG tablet, Take 81 mg by mouth daily., Disp: , Rfl:  .  Cholecalciferol (VITAMIN D) 2000 units tablet, Take 2,000 Units by mouth daily., Disp: , Rfl:  .  dicyclomine  (BENTYL) 10 MG capsule, TAKE ONE CAPSULE BY MOUTH THREE TIMES DAILY BEFORE MEALS., Disp: 270 capsule, Rfl: 1 .  DULoxetine (CYMBALTA) 20 MG capsule, Take 2 capsules daily, Disp: 180 capsule, Rfl: 3 .  fluticasone (FLONASE) 50 MCG/ACT nasal spray, Place 1 spray into both nostrils daily., Disp: 48 g, Rfl: 1 .  gabapentin (NEURONTIN) 600 MG tablet, TAKE 1 TABLET BY MOUTH THREE TIMES DAILY GENERIC EQUIVALENT FOR NEURONTIN, Disp: 270 tablet, Rfl: 1 .  levothyroxine (SYNTHROID, LEVOTHROID) 25 MCG tablet, Take 25 mcg by mouth daily., Disp: , Rfl:  .  LINZESS 145 MCG CAPS capsule, TAKE ONE CAPSULE BY MOUTH DAILY BEFORE BREAKFAST, Disp: 90 capsule, Rfl: 2 .  meclizine (ANTIVERT) 12.5 MG tablet, Take 1 tablet (12.5 mg total) by mouth 3 (three) times daily as needed for dizziness., Disp: 30 tablet, Rfl: 6 .  metoprolol succinate (TOPROL-XL) 25 MG 24 hr tablet, TAKE 1  TABLET BY MOUTH TWICE A DAY, Disp: 180 tablet, Rfl: 1 .  nystatin-triamcinolone (MYCOLOG II) cream, APPLY TO CORNERS OF THE MOUTH TWICE A DAY AS NEEDED FOR CRACKING/BURNING, Disp: , Rfl: 2 .  omeprazole (PRILOSEC) 40 MG capsule, Take 40 mg by mouth daily., Disp: , Rfl:  .  potassium chloride SA (K-DUR,KLOR-CON) 20 MEQ tablet, Take 1 tablet (20 mEq total) by mouth daily., Disp: 90 tablet, Rfl: 1 .  rivaroxaban (XARELTO) 2.5 MG TABS tablet, Take 1 tablet (2.5 mg total) by mouth 2 (two) times daily., Disp: 60 tablet, Rfl: 11 .  rosuvastatin (CRESTOR) 40 MG tablet, TAKE 1 TABLET BY MOUTH EVERY DAY, Disp: 90 tablet, Rfl: 3 .  vitamin B-12 1000 MCG tablet, Take 1 tablet (1,000 mcg total) by mouth daily., Disp: 30 tablet, Rfl: 0 .  nitroGLYCERIN (NITROSTAT) 0.4 MG SL tablet, Place 1 tablet (0.4 mg total) under the tongue every 5 (five) minutes as needed for chest pain., Disp: 100 tablet, Rfl: 1  EXAM:  Vitals:   11/29/18 1344  BP: (!) 90/58  Pulse: 98  Temp: 97.7 F (36.5 C)  SpO2: 98%    Body mass index is 18.28 kg/m.  GENERAL: vitals  reviewed and listed above, alert, oriented, appears well hydrated and in no acute distress  HEENT: atraumatic, conjunttiva clear, no obvious abnormalities on inspection of external nose and ears  NECK: no obvious masses on inspection  LUNGS: clear to auscultation bilaterally, no wheezes, rales or rhonchi, good air movement  CV: HRRR, no peripheral edema  MS: moves all extremities without noticeable abnormality, echymosis and mild edema ant L foot around distal 3rd MT and 3 middle phalanges, TTP most pronounced over distal 3rd MT.  PSYCH: pleasant and cooperative, no obvious depression or anxiety  ASSESSMENT AND PLAN:  Discussed the following assessment and plan:  Left foot pain - Plan: DG Foot Complete Left  Peripheral vascular disease (HCC)  Hypokalemia  Hypothyroidism, unspecified type - Plan: TSH  Anemia due to vitamin B12 deficiency, unspecified B12 deficiency type - Plan: CBC, Vitamin B12  Hyperlipidemia associated with type 2 diabetes mellitus (HCC)  Type 2 diabetes mellitus without complication, without long-term current use of insulin (HCC) - Plan: Basic metabolic panel, Hemoglobin A1c  Aortoiliac occlusive disease (HCC)  Coronary artery disease involving native coronary artery of native heart without angina pectoris  Stress-induced cardiomyopathy  -xray foot, concern for fx  -labs per orders -cont care with specialist and current meds o/w -follow up 3 months -Patient advised to return or notify a doctor immediately if symptoms worsen or persist or new concerns arise.  Patient Instructions  BEFORE YOU LEAVE: -xray  -labs -follow up: 3-4 months  Xray of the foot today and may send referral pending results.  We have ordered labs or studies at this visit. It can take up to 1-2 weeks for results and processing. IF results require follow up or explanation, we will call you with instructions. Clinically stable results will be released to your [facility]. If you have  not heard from [place] or cannot find your results in [facility] in 2 weeks please contact our office at [phone].  If you are not yet signed up for [facility], please consider signing up.          Lucretia Kern, DO

## 2018-12-03 NOTE — Addendum Note (Signed)
Addended by: Agnes Lawrence on: 12/03/2018 04:07 PM   Modules accepted: Orders

## 2018-12-05 ENCOUNTER — Ambulatory Visit: Payer: BLUE CROSS/BLUE SHIELD | Admitting: Neurology

## 2019-01-01 NOTE — Progress Notes (Signed)
HISTORY AND PHYSICAL     CC:  Follow up Requesting Provider:  Lucretia Kern, DO  HPI: This is a 63 y.o. female with a complex hx of arterial occlusive disease.  She has hx of aortobifemoral bypass grafting for critical limb ischemia.  In June of last year, she presented with sx of mesenteric ischemia.  She had weight loss dow to 86 lbs and extremely frail as well as pain after eating.  She underwent arteriography on May 22, 2018 by Dr. 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  They were both successfully treated with stenting.  She had immediate relief and had gained back up to 100 lbs when she saw Dr. Donnetta Hutching in August.  She presents today for follow up.  She states that she is doing well.  She does not have any nausea/vomiting with eating, weight loss or fear of food.  Her legs do feel fatigued when she walks a good bit, however this gets better the longer she walks.  She says she takes potassium and it helps her legs.   She states that about 1-2 months ago, her cardiologist, Dr. Saunders Revel stopped her Brilinta and started her on a low dose Xarelto.    The pt is on a statin for cholesterol management.  The pt is not diabetic.   The pt is on a BB for hypertension.   Tobacco hx:  Remote-quit 2015 The pt is on a daily aspirin.    Past Medical History:  Diagnosis Date  . Allergy   . Alopecia   . 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 Anxiety   . Aortoiliac occlusive disease ([place]) 08/14/2017  . B12 deficiency anemia 09/14/2015  . Blood transfusion without reported diagnosis   . BPPV (benign paroxysmal positional vertigo) 07/08/2015  . Cardiomyopathy ([place]) 11/03/2017  . Chronic back pain   . Chronic daily headache 03/29/2013   takes bc powder  . Closed right hip fracture ([place]) 09/10/2015  . Coronary artery disease involving native coronary artery of native heart without angina pectoris  10/13/2017   DES to mid RCA  . Depression   . DM (diabetes mellitus) ([place])    diet controlled; does not check BG  . Essential (hemorrhagic) thrombocythemia ([place]) 09/06/2018  . Family history of early CAD 04/08/2016  . GERD (gastroesophageal reflux disease)   . History of hiatal hernia   . Hot flashes   . Hyperlipidemia associated with type 2 diabetes mellitus ([place]) 05/11/2017  . Hypothyroidism 09/10/2015  . IBS (irritable bowel syndrome) 03/29/2013  . Neck pain 07/08/2015  . 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 Peripheral vascular disease ([place]) 11/03/2017  . QT prolongation   . Sacroiliitis ([place]) 09/06/2018  . Spasms of the hands or feet 10/08/2017  . Syncope 06/07/2016  . Tubular adenoma of colon 09/08/2003  . Vertigo   . Wears glasses     Past Surgical History:  Procedure Laterality Date  . ABDOMINAL AORTOGRAM N/A 08/02/2017   Procedure: ABDOMINAL AORTOGRAM;  Surgeon: Wellington Hampshire, MD;  Location: [facility] [facility];  Service: Cardiovascular;  Laterality: N/A;  . AORTA - BILATERAL FEMORAL ARTERY BYPASS GRAFT N/A 08/14/2017   Procedure: AORTA BIFEMORAL BYPASS GRAFT;  Surgeon: Rosetta Posner, MD;  [facility];  Service: Vascular;  Laterality: N/A;  . COLONOSCOPY    . CORONARY STENT INTERVENTION N/A 10/13/2017   Procedure:  CORONARY STENT INTERVENTION;  Surgeon: Nelva Bush, MD;  Location: [place] [facility];  Service: Cardiovascular;  Laterality: N/A;  . dental implant    . ECTOPIC PREGNANCY SURGERY    . EMBOLECTOMY N/A 08/14/2017   Procedure: EMBOLECTOMY FEMORAL;  Surgeon: Rosetta Posner, MD;  Location: [facility];  Service: Vascular;  Laterality: N/A;  . FEMORAL-POPLITEAL BYPASS GRAFT Right 08/14/2017   Procedure: Right Femoral to Above Knee Popliteal Bypass Graft using Non-Reversed Greater Saphenous Vein Graft from Right Leg;  Surgeon: Rosetta Posner, MD;  Location: [facility];  Service: Vascular;  Laterality: Right;  . FLEXIBLE SIGMOIDOSCOPY N/A 08/14/2013   Procedure:  FLEXIBLE SIGMOIDOSCOPY;  Surgeon: Ladene Artist, MD;  [facility];  Service: Endoscopy;  Laterality: N/A;  . LEFT HEART CATH AND CORONARY ANGIOGRAPHY N/A 10/13/2017   Procedure: LEFT HEART CATH AND CORONARY ANGIOGRAPHY;  Surgeon: Nelva Bush, MD;  Location: [place] [facility];  Service: Cardiovascular;  Laterality: N/A;  . LOWER EXTREMITY ANGIOGRAPHY Bilateral 08/02/2017   Procedure: Lower Extremity Angiography;  Surgeon: Wellington Hampshire, MD;  Location: [place] [facility];  Service: Cardiovascular;  Laterality: Bilateral;  . MULTIPLE TOOTH EXTRACTIONS    . PERIPHERAL VASCULAR INTERVENTION  05/22/2018   Procedure: PERIPHERAL VASCULAR INTERVENTION;  Surgeon: Serafina Mitchell, MD;  Location: [place] [facility];  Service: Cardiovascular;;  SMA and Celiac  . PILONIDAL CYST EXCISION    . POLYPECTOMY    . THROMBECTOMY FEMORAL ARTERY Right 08/14/2017   Procedure: THROMBECTOMY FEMORAL ARTERY;  Surgeon: Rosetta Posner, MD;  Location: [facility];  Service: Vascular;  Laterality: Right;  . TONSILLECTOMY    . TOTAL HIP ARTHROPLASTY  09/11/2015   Procedure: TOTAL HIP ARTHROPLASTY;  Surgeon: Renette Butters, MD;  Location: [facility];  Service: Orthopedics;;  . ULTRASOUND GUIDANCE FOR VASCULAR ACCESS  10/13/2017   Procedure: Ultrasound Guidance For Vascular Access;  Surgeon: Nelva Bush, MD;  Location: [facility] [facility];  Service: Cardiovascular;;    Allergies  Allergen Reactions  . Amitiza [Lubiprostone] Diarrhea    Uncontrollable diarrhea  . Nortriptyline Other (See Comments)    Stomach distention  . Sulfa Antibiotics Nausea And Vomiting and Other (See Comments)    GI distress/pain  . Tramadol Itching and Rash  . Atorvastatin Nausea And Vomiting  . Claritin [Loratadine] Other (See Comments)    Hot flashes  . Dexamethasone Itching  . Prednisone Rash    Current Outpatient Medications  Medication Sig Dispense Refill  . acetaminophen (TYLENOL) 500 MG tablet Take 1,000 mg by mouth  daily as needed for headache (pain).     Marland Kitchen acyclovir ointment (ZOVIRAX) 5 % Apply 1 application topically every 3 (three) hours as needed (fever blisters). 15 g 3  . aspirin EC 81 MG tablet Take 81 mg by mouth daily.    . Cholecalciferol (VITAMIN D) 2000 units tablet Take 2,000 Units by mouth daily.    Marland Kitchen dicyclomine (BENTYL) 10 MG capsule TAKE ONE CAPSULE BY MOUTH THREE TIMES DAILY BEFORE MEALS. 270 capsule 1  . DULoxetine (CYMBALTA) 20 MG capsule Take 2 capsules daily 180 capsule 3  . fluticasone (FLONASE) 50 MCG/ACT nasal spray Place 1 spray into both nostrils daily. 48 g 1  . gabapentin (NEURONTIN) 600 MG tablet TAKE 1 TABLET BY MOUTH THREE TIMES DAILY GENERIC EQUIVALENT FOR NEURONTIN 270 tablet 1  . levothyroxine (SYNTHROID, LEVOTHROID) 25 MCG tablet Take 25 mcg by mouth daily.    Marland Kitchen LINZESS 145 MCG CAPS capsule TAKE ONE  CAPSULE BY MOUTH DAILY BEFORE BREAKFAST 90 capsule 2  . meclizine (ANTIVERT) 12.5 MG tablet Take 1 tablet (12.5 mg total) by mouth 3 (three) times daily as needed for dizziness. 30 tablet 6  . metoprolol succinate (TOPROL-XL) 25 MG 24 hr tablet TAKE 1 TABLET BY MOUTH TWICE A DAY 180 tablet 1  . nitroGLYCERIN (NITROSTAT) 0.4 MG SL tablet Place 1 tablet (0.4 mg total) under the tongue every 5 (five) minutes as needed for chest pain. 100 tablet 1  . nystatin-triamcinolone (MYCOLOG II) cream APPLY TO CORNERS OF THE MOUTH TWICE A DAY AS NEEDED FOR CRACKING/BURNING  2  . omeprazole (PRILOSEC) 40 MG capsule Take 40 mg by mouth daily.    . potassium chloride SA (K-DUR,KLOR-CON) 20 MEQ tablet Take 1 tablet (20 mEq total) by mouth daily. 90 tablet 1  . rivaroxaban (XARELTO) 2.5 MG TABS tablet Take 1 tablet (2.5 mg total) by mouth 2 (two) times daily. 60 tablet 11  . rosuvastatin (CRESTOR) 40 MG tablet TAKE 1 TABLET BY MOUTH EVERY DAY 90 tablet 3  . vitamin B-12 1000 MCG tablet Take 1 tablet (1,000 mcg total) by mouth daily. 30 tablet 0   No current facility-administered medications for  this visit.     Family History  Problem Relation Age of Onset  . Arthritis Mother   . Hyperlipidemia Mother   . Heart disease Mother   . Hypertension Mother   . Stroke Mother 89  . Irritable bowel syndrome Mother   . Thyroid disease Mother   . Heart attack Mother   . Heart disease Father   . Hyperlipidemia Father   . Hypertension Father   . Stroke Father 39  . Thyroid disease Father   . Prostate cancer Father   . Heart attack Father   . Lung cancer Brother   . Heart attack Maternal Grandfather   . Heart attack Maternal Uncle   . Colon cancer Neg Hx   . Rectal cancer Neg Hx   . Stomach cancer Neg Hx   . Esophageal cancer Neg Hx     Social History   Socioeconomic History  . Marital status: Married    Spouse name: Not on file  . Number of children: 0  . Years of education: Not on file  . Highest education level: Not on file  Occupational History  . Occupation: caregiver  Social Needs  . Financial resource strain: Not on file  . Food insecurity:    Worry: Not on file    Inability: Not on file  . Transportation needs:    Medical: Not on file    Non-medical: Not on file  Tobacco Use  . Smoking status: Former Smoker    Packs/day: 0.50    Types: Cigarettes    Last attempt to quit: 10/07/2014    Years since quitting: 4.2  . Smokeless tobacco: Never Used  Substance and Sexual Activity  . Alcohol use: No    Alcohol/week: 0.0 standard drinks  . Drug use: No  . Sexual activity: Yes    Partners: Male  Lifestyle  . Physical activity:    Days per week: Not on file    Minutes per session: Not on file  . Stress: Not on file  Relationships  . Social connections:    Talks on phone: Not on file    Gets together: Not on file    Attends religious service: Not on file    Active member of club or organization: Not on file  Attends meetings of clubs or organizations: Not on file    Relationship status: Not on file  . Intimate partner violence:    Fear of current or  ex partner: Not on file    Emotionally abused: Not on file    Physically abused: Not on file    Forced sexual activity: Not on file  Other Topics Concern  . Not on file  Social History Narrative   Work or School: homemaker      Home Situation: lives with her husband,[name] takes care of her elderly parents       Spiritual Beliefs: Christian      Lifestyle: no regular exercise, poor diet              REVIEW OF SYSTEMS:   [X]  denotes positive finding, [ ]  denotes negative finding Cardiac  Comments:  Chest pain or chest pressure:    Shortness of breath upon exertion:    Short of breath when lying flat:    Irregular heart rhythm:        Vascular    Pain in calf, thigh, or hip brought on by ambulation: x   Pain in feet at night that wakes you up from your sleep:     Blood clot in your veins:    Leg swelling:         Pulmonary    Oxygen at home:    Productive cough:     Wheezing:         Neurologic    Sudden weakness in arms or legs:  x   Sudden numbness in arms or legs:  x   Sudden onset of difficulty speaking or slurred speech:    Temporary loss of vision in one eye:     Problems with dizziness:  x       Gastrointestinal    Blood in stool:     Vomited blood:         Genitourinary    Burning when urinating:     Blood in urine:        Psychiatric    Major depression:         Hematologic    Bleeding problems:    Problems with blood clotting too easily:        Skin    Rashes or ulcers:        Constitutional    Fever or chills:      PHYSICAL EXAMINATION:  Today's Vitals   01/03/19 0837  BP: 111/71  Pulse: (!) 56  Resp: 14  Temp: 97.6 F (36.4 C)  TempSrc: Oral  SpO2: 97%  Weight: 100 lb (45.4 kg)  Height: 5\' 3"  (1.6 m)   Body mass index is 17.71 kg/m.   General:  WDWN in NAD; vital signs documented above Gait: Normal HENT: WNL, normocephalic Pulmonary: normal non-labored breathing , without Rales, rhonchi,  wheezing Cardiac: regular  HR, without  Murmurs without carotid bruits Abdomen: soft, NT, no masses Skin: without rashes Vascular Exam/Pulses:  Right Left  DP 2+ (normal) 2+ (normal)  PT Unable to palpate  Unable to palpate    Extremities: without ischemic changes, without Gangrene , without cellulitis; without open wounds;  Musculoskeletal: no muscle wasting or atrophy  Neurologic: A&O X 3;  No focal weakness or paresthesias are detected Psychiatric:  The pt has Normal affect.   Non-Invasive Vascular Imaging:   Mesenteric: 70 to 99% stenosis in the superior mesenteric artery and celiac artery. Elevated velocities seen in  SMA and Celiac stents are higher than previously recorded.    ASSESSMENT/PLAN:: 63 y.o. female with hx of mesenteric ischemia with stenting of SMA and celiac artery by Dr. Trula Slade in July 2019.   -pt continues to do well and is asymptomatic.  Her duplex shows an increase in velocites of her SMA and celiac stents.  Her Brilinta was discontinued in November by cardiology and she was placed on Xarelto and baby asa daily.  Dr. Donnetta Hutching feels that this far out, stopping the Kary Kos is okay as far as her stents and will continue the Xarelto and aspirin. -I discussed with Dr. Oneida Alar and given the pt remains asymptomatic including no weight loss, fear of food or nausea/vomiting after eating, will repeat her duplex in 6 months.  She will call sooner should she develop any of these sx. She will also have BLE arterial duplex and ABI's at that time.    [name], PA-C [facility] [phone]  Clinic MD:  Oneida Alar

## 2019-01-03 ENCOUNTER — Encounter: Payer: Self-pay | Admitting: Family

## 2019-01-03 ENCOUNTER — Ambulatory Visit (HOSPITAL_COMMUNITY)
Admission: RE | Admit: 2019-01-03 | Discharge: 2019-01-03 | Disposition: A | Discharge status: Home / Self Care | Payer: PRIVATE HEALTH INSURANCE | Source: Ambulatory Visit | Attending: Vascular Surgery | Admitting: Vascular Surgery

## 2019-01-03 ENCOUNTER — Other Ambulatory Visit: Payer: Self-pay

## 2019-01-03 ENCOUNTER — Other Ambulatory Visit (INDEPENDENT_AMBULATORY_CARE_PROVIDER_SITE_OTHER): Payer: PRIVATE HEALTH INSURANCE

## 2019-01-03 ENCOUNTER — Ambulatory Visit (INDEPENDENT_AMBULATORY_CARE_PROVIDER_SITE_OTHER): Payer: PRIVATE HEALTH INSURANCE | Admitting: Physician Assistant

## 2019-01-03 VITALS — BP 111/71 | HR 56 | Temp 97.6°F | Resp 14 | Ht 63.0 in | Wt 100.0 lb

## 2019-01-03 DIAGNOSIS — K551 Chronic vascular disorders of intestine: Secondary | ICD-10-CM | POA: Diagnosis present

## 2019-01-03 DIAGNOSIS — D519 Vitamin B12 deficiency anemia, unspecified: Secondary | ICD-10-CM | POA: Diagnosis not present

## 2019-01-03 LAB — CBC WITH DIFFERENTIAL/PLATELET
BASOS ABS: 0 10*3/uL (ref 0.0–0.1)
Basophils Relative: 0.4 % (ref 0.0–3.0)
EOS ABS: 0.1 10*3/uL (ref 0.0–0.7)
Eosinophils Relative: 0.9 % (ref 0.0–5.0)
HCT: 37.2 % (ref 36.0–46.0)
Hemoglobin: 12.2 g/dL (ref 12.0–15.0)
Lymphocytes Relative: 17.9 % (ref 12.0–46.0)
Lymphs Abs: 1.2 10*3/uL (ref 0.7–4.0)
MCHC: 32.9 g/dL (ref 30.0–36.0)
MCV: 96.4 fl (ref 78.0–100.0)
Monocytes Absolute: 0.7 10*3/uL (ref 0.1–1.0)
Monocytes Relative: 10.8 % (ref 3.0–12.0)
Neutro Abs: 4.7 10*3/uL (ref 1.4–7.7)
Neutrophils Relative %: 70 % (ref 43.0–77.0)
Platelets: 236 10*3/uL (ref 150.0–400.0)
RBC: 3.86 Mil/uL — ABNORMAL LOW (ref 3.87–5.11)
RDW: 13.4 % (ref 11.5–15.5)
WBC: 6.7 10*3/uL (ref 4.0–10.5)

## 2019-01-07 ENCOUNTER — Other Ambulatory Visit: Payer: Self-pay

## 2019-02-06 ENCOUNTER — Other Ambulatory Visit: Payer: Self-pay | Admitting: *Deleted

## 2019-02-06 ENCOUNTER — Telehealth: Payer: Self-pay | Admitting: Gastroenterology

## 2019-02-06 MED ORDER — OMEPRAZOLE 40 MG PO CPDR: 40.0000 mg | DELAYED_RELEASE_CAPSULE | Freq: Every day | ORAL | 3 refills | Status: DC

## 2019-02-06 MED ORDER — POTASSIUM CHLORIDE CRYS ER 20 MEQ PO TBCR: 20.0000 meq | EXTENDED_RELEASE_TABLET | Freq: Every day | ORAL | 1 refills | Status: DC

## 2019-02-06 MED ORDER — GABAPENTIN 600 MG PO TABS: ORAL_TABLET | ORAL | 1 refills | Status: DC

## 2019-02-06 NOTE — Telephone Encounter (Signed)
[facility] called to inquire about omeprazole refill request.

## 2019-02-06 NOTE — Telephone Encounter (Signed)
Prescription refill sent to patient's pharmacy

## 2019-02-06 NOTE — Telephone Encounter (Signed)
Rx done. 

## 2019-03-19 ENCOUNTER — Other Ambulatory Visit: Payer: Self-pay | Admitting: Gastroenterology

## 2019-03-19 DIAGNOSIS — R112 Nausea with vomiting, unspecified: Secondary | ICD-10-CM

## 2019-03-19 DIAGNOSIS — K2970 Gastritis, unspecified, without bleeding: Secondary | ICD-10-CM

## 2019-04-02 ENCOUNTER — Other Ambulatory Visit: Payer: Self-pay

## 2019-04-02 ENCOUNTER — Encounter: Payer: Self-pay | Admitting: Family Medicine

## 2019-04-02 ENCOUNTER — Ambulatory Visit (INDEPENDENT_AMBULATORY_CARE_PROVIDER_SITE_OTHER): Payer: PRIVATE HEALTH INSURANCE | Admitting: Family Medicine

## 2019-04-02 VITALS — BP 133/85 | Temp 98.1°F | Wt 97.4 lb

## 2019-04-02 DIAGNOSIS — F325 Major depressive disorder, single episode, in full remission: Secondary | ICD-10-CM | POA: Diagnosis not present

## 2019-04-02 DIAGNOSIS — E119 Type 2 diabetes mellitus without complications: Secondary | ICD-10-CM

## 2019-04-02 DIAGNOSIS — E538 Deficiency of other specified B group vitamins: Secondary | ICD-10-CM | POA: Diagnosis not present

## 2019-04-02 DIAGNOSIS — E039 Hypothyroidism, unspecified: Secondary | ICD-10-CM

## 2019-04-02 DIAGNOSIS — I739 Peripheral vascular disease, unspecified: Secondary | ICD-10-CM

## 2019-04-02 DIAGNOSIS — E1169 Type 2 diabetes mellitus with other specified complication: Secondary | ICD-10-CM | POA: Diagnosis not present

## 2019-04-02 DIAGNOSIS — D649 Anemia, unspecified: Secondary | ICD-10-CM

## 2019-04-02 DIAGNOSIS — E785 Hyperlipidemia, unspecified: Secondary | ICD-10-CM

## 2019-04-02 NOTE — Progress Notes (Signed)
Virtual Visit via Video Note  I connected with Erin [name]  on 04/02/19 at  1:00 PM EDT by a video enabled telemedicine application and verified that I am speaking with the correct person using two identifiers.  Location patient: home Location provider:work or home office Persons participating in the virtual visit: patient, provider  I discussed the limitations of evaluation and management by telemedicine and the availability of in person appointments. The patient expressed understanding and agreed to proceed.   HPI:  Erin [name] is a pleasant 63 y.o. here for follow up. Chronic medical problems summarized below were reviewed for changes and stability and were updated as needed below. These issues and their treatment remain stable for the most part.  REports doing ok. Weight has come down some since last visit and admits often does not eat. She cares for her parents and will feed them but skip meals. Some stress with COVID19 pandemic. She and her parents are isolated. Rarely goes out. Agrees to try to eat better. Sees GI. Denies fevers, vomiting, bleeding. Denies CP, SOB, DOE, treatment intolerance or new symptoms.  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 early for this, underwent stenting with Dr. Trula Slade in 06/2018  Hypokalemia: -hx of -takes potassium -did not tol acei/arb   Depression/underweight/neuropathy/pain: -long hx neuropathy, sees neurology -had longstanding  pain after vascular surgery -cymbalta helped all of the above -takes b12 for neuropathy, b12 def  ROS: See pertinent positives and negatives per HPI.  Past Medical History:  Diagnosis Date  . Allergy   . Alopecia   . Anal cancer ([place]) 08/14/13   invasive squamous cell ca, s/p radiation 10/20-11/26/14 60.4Gy/41fx and chemo  . Anxiety   . 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 B12 deficiency anemia 09/14/2015  . Blood transfusion without reported diagnosis   . BPPV (benign paroxysmal positional vertigo) 07/08/2015  . Cardiomyopathy ([place]) 11/03/2017  . Chronic back pain   . Chronic daily headache 03/29/2013   takes bc powder  . Closed right hip fracture ([place]) 09/10/2015  . Coronary artery disease involving native coronary artery of native heart without angina pectoris 10/13/2017   DES to mid RCA  . Depression   . DM (diabetes mellitus) ([place])    diet controlled; does not check BG  . Essential (hemorrhagic) thrombocythemia ([place]) 09/06/2018  . Family history of early CAD 04/08/2016  . GERD (gastroesophageal reflux disease)   . History of hiatal hernia   . Hot flashes   . Hyperlipidemia associated with type 2 diabetes mellitus ([place]) 05/11/2017  . Hypothyroidism 09/10/2015  . IBS (irritable bowel syndrome) 03/29/2013  . Neck pain 07/08/2015  . 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 Peripheral vascular disease ([place]) 11/03/2017  . QT prolongation   . Sacroiliitis ([place]) 09/06/2018  . Spasms of the hands or feet 10/08/2017  . Syncope 06/07/2016  . Tubular adenoma of colon 09/08/2003  . Vertigo   . Wears glasses     Past Surgical History:  Procedure Laterality Date  . ABDOMINAL AORTOGRAM N/A 08/02/2017   Procedure: ABDOMINAL AORTOGRAM;  Surgeon: Wellington Hampshire, MD;  Location: [place] [facility];  Service: Cardiovascular;  Laterality: N/A;  . AORTA - BILATERAL FEMORAL ARTERY BYPASS GRAFT N/A 08/14/2017   Procedure: AORTA BIFEMORAL BYPASS GRAFT;  Surgeon: Rosetta Posner,  MD;  [facility];  Service: Vascular;  Laterality: N/A;  . COLONOSCOPY    . CORONARY STENT INTERVENTION N/A 10/13/2017   Procedure: CORONARY STENT INTERVENTION;  Surgeon: Nelva Bush, MD;  Location: [place] [facility];  Service: Cardiovascular;  Laterality: N/A;  . dental implant    . ECTOPIC PREGNANCY SURGERY    . EMBOLECTOMY N/A 08/14/2017   Procedure: EMBOLECTOMY FEMORAL;  Surgeon: Rosetta Posner, MD;  [facility];  Service: Vascular;  Laterality: N/A;  . FEMORAL-POPLITEAL BYPASS GRAFT Right 08/14/2017   Procedure: Right Femoral to Above Knee Popliteal Bypass Graft using Non-Reversed Greater Saphenous Vein Graft from Right Leg;  Surgeon: Rosetta Posner, MD;  Location: [place];  Service: Vascular;  Laterality: Right;  . FLEXIBLE SIGMOIDOSCOPY N/A 08/14/2013   Procedure: FLEXIBLE SIGMOIDOSCOPY;  Surgeon: Ladene Artist, MD;  [facility];  Service: Endoscopy;  Laterality: N/A;  . LEFT HEART CATH AND CORONARY ANGIOGRAPHY N/A 10/13/2017   Procedure: LEFT HEART CATH AND CORONARY ANGIOGRAPHY;  Surgeon: Nelva Bush, MD;  Location: [place] [facility];  Service: Cardiovascular;  Laterality: N/A;  . LOWER EXTREMITY ANGIOGRAPHY Bilateral 08/02/2017   Procedure: Lower Extremity Angiography;  Surgeon: Wellington Hampshire, MD;  Location: [place] [facility];  Service: Cardiovascular;  Laterality: Bilateral;  . MULTIPLE TOOTH EXTRACTIONS    . PERIPHERAL VASCULAR INTERVENTION  05/22/2018   Procedure: PERIPHERAL VASCULAR INTERVENTION;  Surgeon: Serafina Mitchell, MD;  Location: [place] [facility];  Service: Cardiovascular;;  SMA and Celiac  . PILONIDAL CYST EXCISION    . POLYPECTOMY    . THROMBECTOMY FEMORAL ARTERY Right 08/14/2017   Procedure: THROMBECTOMY FEMORAL ARTERY;  Surgeon: Rosetta Posner, MD;  Location: [place];  Service: Vascular;  Laterality: Right;  . TONSILLECTOMY    . TOTAL HIP ARTHROPLASTY  09/11/2015   Procedure: TOTAL HIP ARTHROPLASTY;  Surgeon: Renette Butters, MD;   Location: [place];  Service: Orthopedics;;  . ULTRASOUND GUIDANCE FOR VASCULAR ACCESS  10/13/2017   Procedure: Ultrasound Guidance For Vascular Access;  Surgeon: Nelva Bush, MD;  Location: [place] [facility];  Service: Cardiovascular;;    Family History  Problem Relation Age of Onset  . Arthritis Mother   . Hyperlipidemia Mother   . Heart disease Mother   . Hypertension Mother   . Stroke Mother 20  . Irritable bowel syndrome Mother   . Thyroid disease Mother   . Heart attack Mother   . Heart disease Father   . Hyperlipidemia Father   . Hypertension Father   . Stroke Father 49  . Thyroid disease Father   . Prostate cancer Father   . Heart attack Father   . Lung cancer Brother   . Heart attack Maternal Grandfather   . Heart attack Maternal Uncle   . Colon cancer Neg Hx   . Rectal cancer Neg Hx   . Stomach cancer Neg Hx   . Esophageal cancer Neg Hx     SOCIAL HX: see hpi   Current Outpatient Medications:  .  acetaminophen (TYLENOL) 500 MG tablet, Take 1,000 mg by mouth daily as needed for headache (pain). , Disp: , Rfl:  .  acyclovir ointment (ZOVIRAX) 5 %, Apply 1 application topically every 3 (three) hours  as needed (fever blisters)., Disp: 15 g, Rfl: 3 .  aspirin EC 81 MG tablet, Take 81 mg by mouth daily., Disp: , Rfl:  .  BRILINTA 90 MG TABS tablet, Take 90 mg by mouth 2 (two) times daily., Disp: , Rfl:  .  Cholecalciferol (VITAMIN D) 2000 units tablet, Take 2,000 Units by mouth daily., Disp: , Rfl:  .  dicyclomine (BENTYL) 10 MG capsule, TAKE 1 CAPSULE BY MOUTH 3 TIMES A DAY BEFORE MEALS, Disp: 270 capsule, Rfl: 1 .  DULoxetine (CYMBALTA) 20 MG capsule, Take 2 capsules daily, Disp: 180 capsule, Rfl: 3 .  fluticasone (FLONASE) 50 MCG/ACT nasal spray, Place 1 spray into both nostrils daily., Disp: 48 g, Rfl: 1 .  gabapentin (NEURONTIN) 600 MG tablet, TAKE 1 TABLET BY MOUTH THREE TIMES DAILY GENERIC EQUIVALENT FOR NEURONTIN, Disp: 270 tablet, Rfl: 1 .  levothyroxine  (SYNTHROID, LEVOTHROID) 25 MCG tablet, Take 25 mcg by mouth daily., Disp: , Rfl:  .  LINZESS 145 MCG CAPS capsule, TAKE ONE CAPSULE BY MOUTH DAILY BEFORE BREAKFAST, Disp: 90 capsule, Rfl: 2 .  meclizine (ANTIVERT) 12.5 MG tablet, Take 1 tablet (12.5 mg total) by mouth 3 (three) times daily as needed for dizziness., Disp: 30 tablet, Rfl: 6 .  metoprolol succinate (TOPROL-XL) 25 MG 24 hr tablet, TAKE 1 TABLET BY MOUTH TWICE A DAY, Disp: 180 tablet, Rfl: 1 .  nystatin-triamcinolone (MYCOLOG II) cream, APPLY TO CORNERS OF THE MOUTH TWICE A DAY AS NEEDED FOR CRACKING/BURNING, Disp: , Rfl: 2 .  omeprazole (PRILOSEC) 40 MG capsule, Take 1 capsule (40 mg total) by mouth daily., Disp: 30 capsule, Rfl: 3 .  potassium chloride SA (K-DUR,KLOR-CON) 20 MEQ tablet, Take 1 tablet (20 mEq total) by mouth daily., Disp: 90 tablet, Rfl: 1 .  rivaroxaban (XARELTO) 2.5 MG TABS tablet, Take 1 tablet (2.5 mg total) by mouth 2 (two) times daily., Disp: 60 tablet, Rfl: 11 .  rosuvastatin (CRESTOR) 40 MG tablet, TAKE 1 TABLET BY MOUTH EVERY DAY, Disp: 90 tablet, Rfl: 3 .  vitamin B-12 1000 MCG tablet, Take 1 tablet (1,000 mcg total) by mouth daily., Disp: 30 tablet, Rfl: 0 .  nitroGLYCERIN (NITROSTAT) 0.4 MG SL tablet, Place 1 tablet (0.4 mg total) under the tongue every 5 (five) minutes as needed for chest pain., Disp: 100 tablet, Rfl: 1  EXAM:  VITALS per patient if applicable: Today's Vitals   04/02/19 1305  BP: 133/85  Temp: 98.1 F (36.7 C)  Weight: 97 lb 6.4 oz (44.2 kg)   Body mass index is 17.25 kg/m.  GENERAL: alert, oriented, appears well and in no acute distress  HEENT: atraumatic, conjunttiva clear, no obvious abnormalities on inspection of external nose and ears  NECK: normal movements of the head and neck  LUNGS: on inspection no signs of respiratory distress, breathing rate appears normal, no obvious gross SOB, gasping or wheezing  CV: no obvious cyanosis  MS: moves all visible extremities  without noticeable abnormality  PSYCH/NEURO: pleasant and cooperative, no obvious depression or anxiety, speech and thought processing grossly intact  ASSESSMENT AND PLAN:  Discussed the following assessment and plan:  Type 2 diabetes mellitus without complication, without long-term current use of insulin (HCC) - Plan: Hemoglobin Z5G, Basic metabolic panel, Lipid panel  Hyperlipidemia associated with type 2 diabetes mellitus (HCC)  Major depressive disorder in full remission, unspecified whether recurrent ([place])  B12 deficiency  Anemia, unspecified type - Plan: CBC  Hypothyroidism, unspecified type - Plan: TSH  Peripheral vascular disease (  [place])  Reviewed and updated medications.  Weight down a little. Advised small regular meals and gradually increasing and follow up with GI.  Labs per orders and will have her do in office CPE with Dr. Ethlyn Gallery.  Follow up 3-4 months. May need to address BP further if remains elevated.   I discussed the assessment and treatment plan with the patient. The patient was provided an opportunity to ask questions and all were answered. The patient agreed with the plan and demonstrated an understanding of the instructions.    Follow up instructions: Advised assistant Wendie Simmer to help patient arrange the following: -phq9 in epic and forward score to Dr. Maudie Mercury -lab visit and in-office CPE with Dr. Ethlyn Gallery in next few weeks on same day - fasting (ok with Dr. Raliegh Ip) -follow up with Dr. Maudie Mercury in 3-4 months  Lucretia Kern, DO

## 2019-04-04 ENCOUNTER — Telehealth: Payer: Self-pay | Admitting: *Deleted

## 2019-04-04 NOTE — Progress Notes (Signed)
Patient called back and was informed of the message below.  Patient stated she appreciates the concern, declined an appt at this time and stated she will call back if needed.

## 2019-04-04 NOTE — Progress Notes (Signed)
Erin [name],  Can you let Erin [name] know I am a little concerned about her score and wanted to see if she feels we need to discuss her depression further? I know she did not want to discuss it around her parents, but I would like to help her feel better. Would she perhaps want to talk with one of our counselors? Let her know I love Dennison Bulla if she is not seeing someone currently and he could see her virtually. Please give her the info. Thanks!

## 2019-04-04 NOTE — Progress Notes (Signed)
I left a message for the pt to return my call. 

## 2019-04-04 NOTE — Telephone Encounter (Signed)
I left a message for the pt to return my call. 

## 2019-04-04 NOTE — Telephone Encounter (Signed)
See note attached to office visit.

## 2019-04-04 NOTE — Telephone Encounter (Signed)
-----   Message from Lucretia Kern, DO sent at 04/04/2019 10:16 AM EDT -----   ----- Message ----- From: Agnes [place], CMA Sent: 04/02/2019   3:58 PM EDT To: Lucretia Kern, DO  PHQ 9 score in pts chart-9 Erin [name]  ----- Message ----- From: Lucretia Kern, DO Sent: 04/02/2019   1:43 PM EDT To: Agnes [place], CMA  -phq9 in epic and forward score to Dr. Maudie Mercury -lab visit and in-office CPE with Dr. Ethlyn Gallery in next few weeks on same day (ok with Dr. Raliegh Ip) - fasting -follow up with Dr. Maudie Mercury in 3-4 months

## 2019-04-04 NOTE — Telephone Encounter (Signed)
Pt's spouse called.  States that pt is spending time at her parents home and needs to be called on her cell phone number which is [phone].

## 2019-04-05 ENCOUNTER — Telehealth: Payer: Self-pay | Admitting: *Deleted

## 2019-04-05 DIAGNOSIS — E1169 Type 2 diabetes mellitus with other specified complication: Secondary | ICD-10-CM

## 2019-04-05 DIAGNOSIS — E785 Hyperlipidemia, unspecified: Secondary | ICD-10-CM

## 2019-04-05 DIAGNOSIS — E039 Hypothyroidism, unspecified: Secondary | ICD-10-CM

## 2019-04-05 DIAGNOSIS — E119 Type 2 diabetes mellitus without complications: Secondary | ICD-10-CM

## 2019-04-05 DIAGNOSIS — E876 Hypokalemia: Secondary | ICD-10-CM

## 2019-04-05 DIAGNOSIS — I739 Peripheral vascular disease, unspecified: Secondary | ICD-10-CM

## 2019-04-05 DIAGNOSIS — D649 Anemia, unspecified: Secondary | ICD-10-CM

## 2019-04-05 NOTE — Telephone Encounter (Signed)
Copied from [place] [id number] [id number]. Topic: General - Inquiry >> Apr 04, 2019  3:13 PM Scherrie Gerlach wrote: Reason for CRM: pt states she is staying with her parents in [place], and wants to know if she can have her labs done at [facility] or [place] to save her a trip from coming to the office

## 2019-04-08 NOTE — Telephone Encounter (Signed)
Can you check with the lab to see if orders can be placed for there? I am not sure? Or could they print her an order and mail it for the labs I placed? Thanks.

## 2019-04-09 NOTE — Telephone Encounter (Signed)
Per Jacquelynn Cree the pt can go to another [facility] facility of her choice as the future orders were entered.  I called the pt, informed her of this and she stated she will call her insurance company to check for coverage also.

## 2019-04-11 ENCOUNTER — Other Ambulatory Visit: Payer: PRIVATE HEALTH INSURANCE

## 2019-04-15 ENCOUNTER — Encounter: Payer: Self-pay | Admitting: Gastroenterology

## 2019-04-17 ENCOUNTER — Other Ambulatory Visit: Payer: Self-pay

## 2019-04-17 ENCOUNTER — Ambulatory Visit (INDEPENDENT_AMBULATORY_CARE_PROVIDER_SITE_OTHER): Payer: PRIVATE HEALTH INSURANCE | Admitting: Family Medicine

## 2019-04-17 ENCOUNTER — Encounter: Payer: Self-pay | Admitting: Family Medicine

## 2019-04-17 DIAGNOSIS — H8110 Benign paroxysmal vertigo, unspecified ear: Secondary | ICD-10-CM

## 2019-04-17 DIAGNOSIS — I2510 Atherosclerotic heart disease of native coronary artery without angina pectoris: Secondary | ICD-10-CM

## 2019-04-17 DIAGNOSIS — I7409 Other arterial embolism and thrombosis of abdominal aorta: Secondary | ICD-10-CM | POA: Diagnosis not present

## 2019-04-17 DIAGNOSIS — M25551 Pain in right hip: Secondary | ICD-10-CM | POA: Diagnosis not present

## 2019-04-17 DIAGNOSIS — E785 Hyperlipidemia, unspecified: Secondary | ICD-10-CM

## 2019-04-17 DIAGNOSIS — E039 Hypothyroidism, unspecified: Secondary | ICD-10-CM

## 2019-04-17 DIAGNOSIS — K219 Gastro-esophageal reflux disease without esophagitis: Secondary | ICD-10-CM

## 2019-04-17 DIAGNOSIS — R7309 Other abnormal glucose: Secondary | ICD-10-CM

## 2019-04-17 DIAGNOSIS — E1169 Type 2 diabetes mellitus with other specified complication: Secondary | ICD-10-CM

## 2019-04-17 NOTE — Progress Notes (Signed)
Virtual Visit via Video Note  I connected with Erin [name]   on 04/17/19 at  1:30 PM EDT by a video enabled telemedicine application and verified that I am speaking with the correct person using two identifiers.  Location patient: home Location provider:work office Persons participating in the virtual visit: patient, provider  I discussed the limitations of evaluation and management by telemedicine and the availability of in person appointments. The patient expressed understanding and agreed to proceed.   Erin [name] DOB: [birth year] Encounter date: 04/17/2019  This is a 63 y.o. female who presents to establish care. No chief complaint on file.   History of present illness: Has been forcing self to eat and feeling stronger now. Caring for 70 yr old father and 77 yr old mother. Has been caring for them for 2 years.   CAD/cardiomyopathy/aneurysm aorta: following with cardiology/vascular. She feels she is doing quite well at this point. Had period of time she wasn't doing well - issues with potassium dropping.   GERD: no current issues with this.   DMII: doing well; doesn't take medication. Just watches what she is eating. Doesn't test sugars. Thinks it was low when she wasn't eating well. Feels better now.   HL: still on crestor 40mg .   BPPV: does still have episodes of dizziness, but this has improved. Hasn't seen neurologist since switching insurance either.    Hypothyroid: still taking medication.   Hx of rectal cancer - completed radiation/chemo.   Broke hip and feels like she still isn't walking normally even thought she did complete PT. Does get injections. Has to find another ortho/pain mgmt because her pain mgmt isn't in network.   GI has contacted her for repeat colonoscopy; she is due next month. Follows with Dr. Fuller Plan.   Past Medical History:  Diagnosis Date  . Allergy   . Alopecia   . Anal cancer ([place]) 08/14/13   invasive squamous cell ca, s/p radiation  10/20-11/26/14 60.4Gy/41fx and chemo  . Anxiety   . 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 B12 deficiency anemia 09/14/2015  . Blood transfusion without reported diagnosis   . BPPV (benign paroxysmal positional vertigo) 07/08/2015  . Cardiomyopathy ([place]) 11/03/2017  . Chronic back pain   . Chronic daily headache 03/29/2013   takes bc powder  . Closed right hip fracture ([place]) 09/10/2015  . Coronary artery disease involving native coronary artery of native heart without angina pectoris 10/13/2017   DES to mid RCA  . Depression   . DM (diabetes mellitus) ([place])    diet controlled; does not check BG  . Essential (hemorrhagic) thrombocythemia ([place]) 09/06/2018  . Family history of early CAD 04/08/2016  . GERD (gastroesophageal reflux disease)   . History of hiatal hernia   . Hot flashes   . Hyperlipidemia associated with type 2 diabetes mellitus ([place]) 05/11/2017  . Hypothyroidism 09/10/2015  . IBS (irritable bowel syndrome) 03/29/2013  . Neck pain 07/08/2015  . 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 Peripheral vascular disease ([place]) 11/03/2017  . QT prolongation   . Sacroiliitis ([place]) 09/06/2018  . Spasms of the hands or feet 10/08/2017  . Syncope 06/07/2016  . Tubular adenoma of colon 09/08/2003  . Vertigo   . Wears glasses    Past Surgical History:  Procedure Laterality Date  . ABDOMINAL AORTOGRAM N/A 08/02/2017   Procedure: ABDOMINAL AORTOGRAM;  Surgeon: Wellington Hampshire, MD;  Location: [place] [facility];  Service: Cardiovascular;  Laterality: N/A;  . AORTA -  BILATERAL FEMORAL ARTERY BYPASS GRAFT N/A 08/14/2017   Procedure: AORTA BIFEMORAL BYPASS GRAFT;  Surgeon: Rosetta Posner, MD;  [facility];  Service: Vascular;  Laterality: N/A;  . COLONOSCOPY    . CORONARY STENT INTERVENTION N/A 10/13/2017   Procedure: CORONARY STENT INTERVENTION;  Surgeon: Nelva Bush, MD;  Location: [place] [facility];  Service: Cardiovascular;  Laterality: N/A;  . dental implant    .  ECTOPIC PREGNANCY SURGERY    . EMBOLECTOMY N/A 08/14/2017   Procedure: EMBOLECTOMY FEMORAL;  Surgeon: Rosetta Posner, MD;  Location: [facility] OR;  Service: Vascular;  Laterality: N/A;  . FEMORAL-POPLITEAL BYPASS GRAFT Right 08/14/2017   Procedure: Right Femoral to Above Knee Popliteal Bypass Graft using Non-Reversed Greater Saphenous Vein Graft from Right Leg;  Surgeon: Rosetta Posner, MD;  Location: [place];  Service: Vascular;  Laterality: Right;  . FLEXIBLE SIGMOIDOSCOPY N/A 08/14/2013   Procedure: FLEXIBLE SIGMOIDOSCOPY;  Surgeon: Ladene Artist, MD;  [facility];  Service: Endoscopy;  Laterality: N/A;  . LEFT HEART CATH AND CORONARY ANGIOGRAPHY N/A 10/13/2017   Procedure: LEFT HEART CATH AND CORONARY ANGIOGRAPHY;  Surgeon: Nelva Bush, MD;  Location: [place] [facility];  Service: Cardiovascular;  Laterality: N/A;  . LOWER EXTREMITY ANGIOGRAPHY Bilateral 08/02/2017   Procedure: Lower Extremity Angiography;  Surgeon: Wellington Hampshire, MD;  Location: [place] [facility];  Service: Cardiovascular;  Laterality: Bilateral;  . MULTIPLE TOOTH EXTRACTIONS    . PERIPHERAL VASCULAR INTERVENTION  05/22/2018   Procedure: PERIPHERAL VASCULAR INTERVENTION;  Surgeon: Serafina Mitchell, MD;  Location: [place] [facility];  Service: Cardiovascular;;  SMA and Celiac  . PILONIDAL CYST EXCISION    . POLYPECTOMY    . THROMBECTOMY FEMORAL ARTERY Right 08/14/2017   Procedure: THROMBECTOMY FEMORAL ARTERY;  Surgeon: Rosetta Posner, MD;  Location: [facility];  Service: Vascular;  Laterality: Right;  . TONSILLECTOMY    . TOTAL HIP ARTHROPLASTY  09/11/2015   Procedure: TOTAL HIP ARTHROPLASTY;  Surgeon: Renette Butters, MD;  Location: [place];  Service: Orthopedics;;  . ULTRASOUND GUIDANCE FOR VASCULAR ACCESS  10/13/2017   Procedure: Ultrasound Guidance For Vascular Access;  Surgeon: Nelva Bush, MD;  Location: [place] [facility];  Service: Cardiovascular;;   Allergies  Allergen Reactions  . Amitiza [Lubiprostone]  Diarrhea    Uncontrollable diarrhea  . Nortriptyline Other (See Comments)    Stomach distention  . Sulfa Antibiotics Nausea And Vomiting and Other (See Comments)    GI distress/pain  . Tramadol Itching and Rash  . Atorvastatin Nausea And Vomiting  . Claritin [Loratadine] Other (See Comments)    Hot flashes  . Dexamethasone Itching  . Prednisone Rash   No outpatient medications have been marked as taking for the 04/17/19 encounter (Office Visit) with [name], MD.   Social History   Tobacco Use  . Smoking status: Former Smoker    Packs/day: 0.50    Types: Cigarettes    Last attempt to quit: 10/07/2014    Years since quitting: 4.5  . Smokeless tobacco: Never Used  Substance Use Topics  . Alcohol use: No    Alcohol/week: 0.0 standard drinks   Family History  Problem Relation Age of Onset  . Arthritis Mother   . Hyperlipidemia Mother   . Heart disease Mother   . Hypertension Mother   . Stroke Mother 105  . Irritable bowel syndrome Mother   . Thyroid disease Mother   . Heart attack Mother   . Heart  disease Father   . Hyperlipidemia Father   . Hypertension Father   . Stroke Father 31  . Thyroid disease Father   . Prostate cancer Father   . Heart attack Father   . Lung cancer Brother 50  . Heart attack Maternal Grandfather   . Heart attack Maternal Uncle   . Colon cancer Neg Hx   . Rectal cancer Neg Hx   . Stomach cancer Neg Hx   . Esophageal cancer Neg Hx      Review of Systems  Constitutional: Negative for chills, fatigue and fever.  Respiratory: Negative for cough, chest tightness, shortness of breath and wheezing.   Cardiovascular: Negative for chest pain, palpitations and leg swelling.    Objective:  There were no vitals taken for this visit.      BP Readings from Last 3 Encounters:  04/02/19 133/85  01/03/19 111/71  11/29/18 (!) 90/58   Wt Readings from Last 3 Encounters:  04/02/19 97 lb 6.4 oz (44.2 kg)  01/03/19 100 lb (45.4 kg)   11/29/18 103 lb 3.2 oz (46.8 kg)    EXAM:  GENERAL: alert, oriented, appears well and in no acute distress  HEENT: atraumatic, conjunctiva clear, no obvious abnormalities on inspection of external nose and ears  NECK: normal movements of the head and neck  LUNGS: on inspection no signs of respiratory distress, breathing rate appears normal, no obvious gross SOB, gasping or wheezing  CV: no obvious cyanosis  MS: moves all visible extremities without noticeable abnormality  PSYCH/NEURO: pleasant and cooperative, no obvious depression or anxiety, speech and thought processing grossly intact  SKIN: no obvious facial skin abnormalities  Assessment/Plan 1. Right hip pain Needs new pain mgmt doc due to insurance change; was getting injections that were helpful for her. - Ambulatory referral to Pain Clinic  2. Coronary artery disease involving native coronary artery of native heart without angina pectoris Following with cardiology. On crestor.   3. Aortoiliac occlusive disease ([place]) Following with vascular.   4. Hypothyroidism, unspecified type On synthroid.   5. GERD without esophagitis On prilosec  6.elevated glucose A1C has not been in diabetic range in last 3 years. We will continue to monitor A1C.   7. Hyperlipidemia associated with type 2 diabetes mellitus ([place]) On crestor  8. Benign paroxysmal positional vertigo, unspecified laterality Intermittent episodes.    Return pending bloodwork results. she will complete bloodwork at [facility].   I discussed the assessment and treatment plan with the patient. The patient was provided an opportunity to ask questions and all were answered. The patient agreed with the plan and demonstrated an understanding of the instructions.   The patient was advised to call back or seek an in-person evaluation if the symptoms worsen or if the condition fails to improve as anticipated.  I provided 25 minutes of non-face-to-face time  during this encounter.   Micheline Rough, MD

## 2019-04-25 ENCOUNTER — Other Ambulatory Visit: Payer: Self-pay | Admitting: Family Medicine

## 2019-04-26 ENCOUNTER — Other Ambulatory Visit: Payer: Self-pay | Admitting: Family Medicine

## 2019-04-26 MED ORDER — MECLIZINE HCL 12.5 MG PO TABS: 12.5000 mg | ORAL_TABLET | Freq: Three times a day (TID) | ORAL | 5 refills | Status: DC | PRN

## 2019-04-26 NOTE — Telephone Encounter (Signed)
I called the pt and she looked at the bottle stated she is currently taking 46mcg.  She is aware the Rx was sent to her pharmacy.  Patient also requested a refill on Meclizine due to recurrent vertigo.  Message sent to Dr Ethlyn Gallery.

## 2019-04-26 NOTE — Telephone Encounter (Signed)
Please confirm dose with her. I don't see recent dose documented. OK to continue on dose she has been taking and ok for 90 tablets 0 refills.

## 2019-04-26 NOTE — Telephone Encounter (Signed)
Meclizine sent.

## 2019-04-30 ENCOUNTER — Ambulatory Visit: Payer: PRIVATE HEALTH INSURANCE | Admitting: Gastroenterology

## 2019-05-01 ENCOUNTER — Other Ambulatory Visit: Payer: Self-pay | Admitting: Family Medicine

## 2019-05-01 NOTE — Addendum Note (Signed)
Addended by: Beckie Busing on: 05/01/2019 12:32 PM   Modules accepted: Orders

## 2019-05-01 NOTE — Telephone Encounter (Signed)
Noted  

## 2019-05-01 NOTE — Telephone Encounter (Signed)
Pt called 04/26/19 and this message was sent to the [facility] via a [facility] encounter:   Pt spoke with her insurance, [employer], and they are going to charge her an increased fee to have labs drawn at [facility]. She would like to have labs sent to [facility] in [place] for her to have them drawn there. Per her insurance this is a contracted site and she will not have any extra fees.  Please send orders to [facility] per pt request. Thanks!  Pt called today, 05/01/19, to state she is at [facility] and they do not have any orders. Orders have been changed to be drawn at [facility] and have been faxed. Pt is aware.

## 2019-05-02 ENCOUNTER — Other Ambulatory Visit: Payer: Self-pay | Admitting: Neurology

## 2019-05-02 ENCOUNTER — Other Ambulatory Visit: Payer: Self-pay | Admitting: Gastroenterology

## 2019-05-02 LAB — COMPREHENSIVE METABOLIC PANEL
ALT: 10 IU/L (ref 0–32)
AST: 19 IU/L (ref 0–40)
Albumin/Globulin Ratio: 1.9 (ref 1.2–2.2)
Albumin: 4.5 g/dL (ref 3.8–4.8)
Alkaline Phosphatase: 86 IU/L (ref 39–117)
BUN/Creatinine Ratio: 15 (ref 12–28)
BUN: 10 mg/dL (ref 8–27)
Bilirubin Total: <0.2 mg/dL (ref 0.0–1.2)
CO2: 21 mmol/L (ref 20–29)
Calcium: 9 mg/dL (ref 8.7–10.3)
Chloride: 103 mmol/L (ref 96–106)
Creatinine, Ser: 0.65 mg/dL (ref 0.57–1.00)
GFR calc Af Amer: 110 mL/min/{1.73_m2} (ref >59)
GFR calc non Af Amer: 95 mL/min/{1.73_m2} (ref >59)
Globulin, Total: 2.4 g/dL (ref 1.5–4.5)
Glucose: 114 mg/dL — ABNORMAL HIGH (ref 65–99)
Potassium: 4.2 mmol/L (ref 3.5–5.2)
Sodium: 140 mmol/L (ref 134–144)
Total Protein: 6.9 g/dL (ref 6.0–8.5)

## 2019-05-02 LAB — CBC WITH DIFFERENTIAL/PLATELET
Basophils Absolute: 0 10*3/uL (ref 0.0–0.2)
Basos: 0 %
EOS (ABSOLUTE): 0.1 10*3/uL (ref 0.0–0.4)
Eos: 1 %
Hematocrit: 36.2 % (ref 34.0–46.6)
Hemoglobin: 11.8 g/dL (ref 11.1–15.9)
Immature Grans (Abs): 0 10*3/uL (ref 0.0–0.1)
Immature Granulocytes: 0 %
Lymphocytes Absolute: 1.1 10*3/uL (ref 0.7–3.1)
Lymphs: 15 %
MCH: 32.2 pg (ref 26.6–33.0)
MCHC: 32.6 g/dL (ref 31.5–35.7)
MCV: 99 fL — ABNORMAL HIGH (ref 79–97)
Monocytes Absolute: 0.7 10*3/uL (ref 0.1–0.9)
Monocytes: 10 %
Neutrophils Absolute: 5.4 10*3/uL (ref 1.4–7.0)
Neutrophils: 74 %
Platelets: 277 10*3/uL (ref 150–450)
RBC: 3.67 x10E6/uL — ABNORMAL LOW (ref 3.77–5.28)
RDW: 13 % (ref 11.7–15.4)
WBC: 7.3 10*3/uL (ref 3.4–10.8)

## 2019-05-02 LAB — LIPID PANEL
Chol/HDL Ratio: 4.3 ratio (ref 0.0–4.4)
Cholesterol, Total: 163 mg/dL (ref 100–199)
HDL: 38 mg/dL — ABNORMAL LOW (ref >39)
LDL Calculated: 77 mg/dL (ref 0–99)
Triglycerides: 242 mg/dL — ABNORMAL HIGH (ref 0–149)
VLDL Cholesterol Cal: 48 mg/dL — ABNORMAL HIGH (ref 5–40)

## 2019-05-02 LAB — HEMOGLOBIN A1C
Est. average glucose Bld gHb Est-mCnc: 103 mg/dL
Hgb A1c MFr Bld: 5.2 % (ref 4.8–5.6)

## 2019-05-02 LAB — TSH: TSH: 2.07 u[IU]/mL (ref 0.450–4.500)

## 2019-05-08 ENCOUNTER — Telehealth: Payer: Self-pay | Admitting: *Deleted

## 2019-05-08 NOTE — Telephone Encounter (Signed)
Copied from [place] [id number]. Topic: General - Inquiry >> May 08, 2019  3:05 PM Richardo Priest, NT wrote: Reason for CRM: Patient returning call from Wendie Simmer. Please advise and call back is [phone].

## 2019-05-08 NOTE — Telephone Encounter (Signed)
See results note. 

## 2019-05-09 ENCOUNTER — Other Ambulatory Visit: Payer: Self-pay | Admitting: Gastroenterology

## 2019-05-09 DIAGNOSIS — R112 Nausea with vomiting, unspecified: Secondary | ICD-10-CM

## 2019-05-09 DIAGNOSIS — K2970 Gastritis, unspecified, without bleeding: Secondary | ICD-10-CM

## 2019-05-18 ENCOUNTER — Other Ambulatory Visit: Payer: Self-pay | Admitting: Physician Assistant

## 2019-06-04 ENCOUNTER — Encounter: Payer: Self-pay | Admitting: Gastroenterology

## 2019-06-04 ENCOUNTER — Ambulatory Visit (INDEPENDENT_AMBULATORY_CARE_PROVIDER_SITE_OTHER): Payer: PRIVATE HEALTH INSURANCE | Admitting: Gastroenterology

## 2019-06-04 ENCOUNTER — Telehealth: Payer: Self-pay

## 2019-06-04 VITALS — Ht 63.0 in | Wt 100.0 lb

## 2019-06-04 DIAGNOSIS — Z7901 Long term (current) use of anticoagulants: Secondary | ICD-10-CM | POA: Diagnosis not present

## 2019-06-04 DIAGNOSIS — K581 Irritable bowel syndrome with constipation: Secondary | ICD-10-CM

## 2019-06-04 DIAGNOSIS — Z86010 Personal history of colonic polyps: Secondary | ICD-10-CM

## 2019-06-04 MED ORDER — NA SULFATE-K SULFATE-MG SULF 17.5-3.13-1.6 GM/177ML PO SOLN: 1.0000 | Freq: Once | ORAL | 0 refills | Status: AC

## 2019-06-04 MED ORDER — LINACLOTIDE 72 MCG PO CAPS: 72.0000 ug | ORAL_CAPSULE | Freq: Every day | ORAL | 11 refills | Status: DC

## 2019-06-04 NOTE — Telephone Encounter (Signed)
[facility] Pre-operative Risk Assessment     Request for surgical clearance:     Endoscopy Procedure  What type of surgery is being performed?     Colonoscopy  When is this surgery scheduled?     07/02/19  What type of clearance is required ?   Pharmacy  Are there any medications that need to be held prior to surgery and how long? Xarelto x 2 days  Practice name and name of physician performing surgery?      [facility]  What is your office phone and fax number?      Phone- [phone]  [phone]  Anesthesia type (None, local, MAC, general) ?       MAC

## 2019-06-04 NOTE — Telephone Encounter (Signed)
Informed patient she can hold Xarelto 2 days prior to her procedure per Cardiology. Patient verbalized understanding. °

## 2019-06-04 NOTE — Progress Notes (Signed)
    History of Present Illness: This is a 63 year old female with constipation, GERD, mesenteric ischemia, personal history of anal cancer and history of piecemeal polypectomy of a tubular adenoma in December 2019.  She relates having severe diarrhea when taking Linzess 145 mcg so she limits to taking it about once per week.  Previously Linzess 72 mcg had not been effective therefore Linzess was increased to 145 mcg which worked for a while.  Her reflux symptoms are well controlled on daily omeprazole.  She no longer has postprandial abdominal pain following stenting of her SMA and celiac artery.  Postprandial abdominal bloating is well controlled with dicyclomine and management of constipation.  She is maintained on Xarelto and is due for colonoscopy for follow-up of piecemeal polypectomy.  Her weight is stable at about 100 pounds.  Her highest weight in the past 4-5 years has been about 110 pounds.  Current Medications, Allergies, Past Medical History, Past Surgical History, Family History and Social History were reviewed in [employer] record.   Physical Exam: Telemedicine - not performed   Assessment and Recommendations:  1.  Personal history of multiple adenomatous colon polyps.  11 polyps removed, including a piecemeal polypectomy of a 25 mm transverse colon polyp, at her last colonoscopy in December 2019.  She is due for colonoscopy to evaluate the piecemeal polypectomy site. The risks (including bleeding, perforation, infection, missed lesions, medication reactions and possible hospitalization or surgery if complications occur), benefits, and alternatives to colonoscopy with possible biopsy and possible polypectomy were discussed with the patient and they consent to proceed.   2. Hold Xarelto 2 days before procedure - will instruct when and how to resume after procedure. Low but real risk of cardiovascular event such as heart attack, stroke, embolism, thrombosis or  ischemia/infarct of other organs off Xarelto explained and need to seek urgent help if this occurs. The patient consents to proceed. Will communicate by phone or EMR with patient's prescribing provider to confirm that holding Xarelto is reasonable in this case.   3. IBS-C.  Change Linzess to 72 mcg daily and continue dicyclomine 10 mg po tid.  4. GERD. Continue omeprazole 40 mg daily. Follow antireflux measures.   5. 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   6. 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    These services were provided via telemedicine, audio and video.  The patient was at home and the provider was in the office, alone.  We discussed the limitations of evaluation and management by telemedicine and the availability of in person appointments.  Patient consented for this telemedicine visit and is aware of possible charges for this service.  Office CMA or LPN participated in this telemedicine service.  Time spent on call: 16 minutes

## 2019-06-04 NOTE — Telephone Encounter (Signed)
As she is about a year out from her mesenteric stents and >1 year out from coronary stents, I think it is fine to hold Xarelto for 2 days before the procedure and then restart when safe from a GI standpoint.  ASA should be continued throughout the perioperative period.  Thanks.  Nelva Bush, MD [facility] Pager: [phone]

## 2019-06-04 NOTE — Patient Instructions (Signed)
Stop taking the Linzess 145 mcg.  We have sent the following medications to your pharmacy for you to pick up at your convenience: Linzess 72 mcg daily.   Continue omeprazole and dicyclomine.   You have been scheduled for a colonoscopy. Please follow written instructions given to you at your visit today.  Please pick up your prep supplies at the pharmacy within the next 1-3 days. If you use inhalers (even only as needed), please bring them with you on the day of your procedure.  Thank you for choosing me and [facility].  Pricilla Riffle. Dagoberto Ligas., MD., [name]

## 2019-06-04 NOTE — Telephone Encounter (Signed)
Dr. Saunders Revel I am asking you about this as [facility] is out all week.  You did PCI on pt and then with intolerance to Brilinta and results of compass trial you and placed on Xarelto 2.5 BID-- she also has stents to mesenteric and celiac arteries done 05/2018.   So now need for colonoscopy - can we stop the xarelto and resume post and should I also check with Dr. Donnetta Hutching?  Thanks.

## 2019-06-07 ENCOUNTER — Telehealth: Payer: Self-pay

## 2019-06-07 NOTE — Telephone Encounter (Signed)
Received fax from [facility] that Linzess 72 mcg prior authorization quantity 30 capsules per 30 days was approved.

## 2019-07-01 ENCOUNTER — Telehealth: Payer: Self-pay | Admitting: Gastroenterology

## 2019-07-01 NOTE — Telephone Encounter (Signed)

## 2019-07-02 ENCOUNTER — Encounter: Payer: Self-pay | Admitting: Gastroenterology

## 2019-07-02 ENCOUNTER — Other Ambulatory Visit: Payer: Self-pay

## 2019-07-02 ENCOUNTER — Ambulatory Visit (AMBULATORY_SURGERY_CENTER): Payer: PRIVATE HEALTH INSURANCE | Admitting: Gastroenterology

## 2019-07-02 VITALS — BP 132/65 | HR 63 | Temp 98.5°F | Resp 16 | Ht 63.0 in | Wt 100.0 lb

## 2019-07-02 DIAGNOSIS — D125 Benign neoplasm of sigmoid colon: Secondary | ICD-10-CM | POA: Diagnosis not present

## 2019-07-02 DIAGNOSIS — D124 Benign neoplasm of descending colon: Secondary | ICD-10-CM

## 2019-07-02 DIAGNOSIS — D123 Benign neoplasm of transverse colon: Secondary | ICD-10-CM

## 2019-07-02 DIAGNOSIS — Z86010 Personal history of colonic polyps: Secondary | ICD-10-CM

## 2019-07-02 MED ORDER — SODIUM CHLORIDE 0.9 % IV SOLN: 500.0000 mL | Freq: Once | INTRAVENOUS | Status: DC

## 2019-07-02 NOTE — Progress Notes (Signed)
Report to PACU, RN, vss, BBS= Clear.  

## 2019-07-02 NOTE — Progress Notes (Signed)
Pt's states no medical or surgical changes since previsit or office visit.  Cw vitals and [place] temps

## 2019-07-02 NOTE — Op Note (Signed)
[place] Patient Name: Erin [name] Procedure Date: 07/02/2019 9:19 AM MRN: [id number] Endoscopist: Ladene Artist , MD Age: 63 Referring MD:  Date of Birth: 1956-03-16 Gender: Female Account #: [id number] Procedure:                Colonoscopy Indications:              Surveillance: Personal history of piecemeal removal                            of large sessile adenoma on last colonoscopy 6                            months ago. Personal history of anal cancer. Medicines:                Monitored Anesthesia Care Procedure:                Pre-Anesthesia Assessment:                           - Prior to the procedure, a History and Physical                            was performed, and patient medications and                            allergies were reviewed. The patient's tolerance of                            previous anesthesia was also reviewed. The risks                            and benefits of the procedure and the sedation                            options and risks were discussed with the patient.                            All questions were answered, and informed consent                            was obtained. Prior Anticoagulants: The patient has                            taken Xarelto (rivaroxaban), last dose was 2 days                            prior to procedure. ASA Grade Assessment: III - A                            patient with severe systemic disease. After                            reviewing the risks and benefits, the patient was  deemed in satisfactory condition to undergo the                            procedure.                           After obtaining informed consent, the colonoscope                            was passed under direct vision. Throughout the                            procedure, the patient's blood pressure, pulse, and                            oxygen saturations were monitored continuously.  The                            Colonoscope was introduced through the anus and                            advanced to the the cecum, identified by                            appendiceal orifice and ileocecal valve. The                            ileocecal valve, appendiceal orifice, and rectum                            were photographed. The quality of the bowel                            preparation was adequate. The patient tolerated the                            procedure well. The colonoscopy was somewhat                            difficult due to restricted mobility of the colon,                            significant looping and a tortuous colon.                            Successful completion of the procedure was aided by                            withdrawing and reinserting the scope,                            straightening and shortening the scope to obtain  bowel loop reduction and using scope torsion. Scope In: 9:26:40 AM Scope Out: 9:57:06 AM Scope Withdrawal Time: 0 hours 24 minutes 52 seconds  Total Procedure Duration: 0 hours 30 minutes 26 seconds  Findings:                 The digital rectal exam findings include anal canal                            deformity, perianal erythema.                           Two sessile polyps were found in the descending                            colon. The polyps were 9 to 10 mm in size. These                            polyps were removed with a hot snare. Resection and                            retrieval were complete.                           A tattoo was seen in the transverse colon. A                            post-polypectomy scar was found at the tattoo site                            with a residual sessile polyp.                           A 7 mm polyp was found in the transverse colon at                            the tattoo site. The polyp was sessile. The polyp                            was  removed with a hot snare. Resection and                            retrieval were complete.                           Five sessile polyps were found in the sigmoid colon                            (1), descending colon (1) and transverse colon (3).                            The polyps were 6 to 8 mm in size. These polyps  were removed with a cold snare. Resection and                            retrieval were complete.                           The exam was otherwise without abnormality on                            direct views. Unable to safely retroflex due to a                            narrow rectal vaulit. Complications:            No immediate complications. Estimated blood loss:                            None. Estimated Blood Loss:     Estimated blood loss: none. Impression:               - Anal deformity found on digital rectal exam and                            perianal erythema.                           - Two 9 to 10 mm polyps in the descending colon,                            removed with a hot snare. Resected and retrieved.                           - A tattoo was seen in the transverse colon. A                            post-polypectomy scar was found at the tattoo site.                           - One 7 mm polyp in the transverse colon at the                            tattos site, removed with a hot snare. Resected and                            retrieved.                           - Five 6 to 8 mm polyps in the sigmoid colon, in                            the descending colon and in the transverse colon,                            removed with a cold snare. Resected and retrieved.                           -  The examination was otherwise normal on direct                            views. Recommendation:           - Repeat colonoscopy in 1-2 year for surveillance -                            multiple precancerous polyps and tortuous colon.                            - Resume Xarelto (rivaroxaban) in 2 days at prior                            dose. Refer to managing physician for further                            adjustment of therapy.                           - Patient has a contact number available for                            emergencies. The signs and symptoms of potential                            delayed complications were discussed with the                            patient. Return to normal activities tomorrow.                            Written discharge instructions were provided to the                            patient.                           - Resume previous diet.                           - Continue present medications.                           - Await pathology results.                           - No aspirin, ibuprofen, naproxen, or other                            non-steroidal anti-inflammatory drugs for 2 weeks                            after polyp removal. Ladene Artist, MD 07/02/2019 10:17:16 AM This report has been signed electronically.

## 2019-07-02 NOTE — Patient Instructions (Signed)
Please read handouts provided. Await pathology results. Continue present medications. Resume Xarelto in 2 days at prior dose. No aspirin, ibuprofen, naproxen, or other non-steriodal anti-inflammatory drugs for 2 weeks after polyp removal.       YOU HAD AN ENDOSCOPIC PROCEDURE TODAY AT [facility]:   Refer to the procedure report that was given to you for any specific questions about what was found during the examination.  If the procedure report does not answer your questions, please call your gastroenterologist to clarify.  If you requested that your care partner not be given the details of your procedure findings, then the procedure report has been included in a sealed envelope for you to review at your convenience later.  YOU SHOULD EXPECT: Some feelings of bloating in the abdomen. Passage of more gas than usual.  Walking can help get rid of the air that was put into your GI tract during the procedure and reduce the bloating. If you had a lower endoscopy (such as a colonoscopy or flexible sigmoidoscopy) you may notice spotting of blood in your stool or on the toilet paper. If you underwent a bowel prep for your procedure, you may not have a normal bowel movement for a few days.  Please Note:  You might notice some irritation and congestion in your nose or some drainage.  This is from the oxygen used during your procedure.  There is no need for concern and it should clear up in a day or so.  SYMPTOMS TO REPORT IMMEDIATELY:   Following lower endoscopy (colonoscopy or flexible sigmoidoscopy):  Excessive amounts of blood in the stool  Significant tenderness or worsening of abdominal pains  Swelling of the abdomen that is new, acute  Fever of 100F or higher    For urgent or emergent issues, a gastroenterologist can be reached at any hour by calling [phone].   DIET:  We do recommend a small meal at first, but then you may proceed to your regular diet.  Drink  plenty of fluids but you should avoid alcoholic beverages for 24 hours.  ACTIVITY:  You should plan to take it easy for the rest of today and you should NOT DRIVE or use heavy machinery until tomorrow (because of the sedation medicines used during the test).    FOLLOW UP: Our staff will call the number listed on your records 48-72 hours following your procedure to check on you and address any questions or concerns that you may have regarding the information given to you following your procedure. If we do not reach you, we will leave a message.  We will attempt to reach you two times.  During this call, we will ask if you have developed any symptoms of COVID 19. If you develop any symptoms (ie: fever, flu-like symptoms, shortness of breath, cough etc.) before then, please call [phone].  If you test positive for Covid 19 in the 2 weeks post procedure, please call and report this information to [place].    If any biopsies were taken you will be contacted by phone or by letter within the next 1-3 weeks.  Please call us at [phone] if you have not heard about the biopsies in 3 weeks.    SIGNATURES/CONFIDENTIALITY: You and/or your care partner have signed paperwork which will be entered into your electronic medical record.  These signatures attest to the fact that that the information above on your After Visit Summary has been reviewed and is understood.  Full responsibility  of the confidentiality of this discharge information lies with you and/or your care-partner.

## 2019-07-02 NOTE — Progress Notes (Signed)
Called to room to assist during endoscopic procedure.  Patient ID and intended procedure confirmed with present staff. Received instructions for my participation in the procedure from the performing physician.  

## 2019-07-04 ENCOUNTER — Telehealth: Payer: Self-pay

## 2019-07-04 NOTE — Telephone Encounter (Signed)
  Follow up Call-  Call back number 07/02/2019 10/24/2018  Post procedure Call Back phone  # [phone]  Permission to leave phone message Yes Yes  Some recent data might be hidden     Patient questions:  Do you have a fever, pain , or abdominal swelling? No. Pain Score  0 *  Have you tolerated food without any problems? Yes.    Have you been able to return to your normal activities? Yes.    Do you have any questions about your discharge instructions: Diet   No. Medications  No. Follow up visit  No.  Do you have questions or concerns about your Care? No.  Actions: * If pain score is 4 or above: No action needed, pain <4.  1. Have you developed a fever since your procedure? no  2.   Have you had an respiratory symptoms (SOB or cough) since your procedure? no  3.   Have you tested positive for COVID 19 since your procedure no  4.   Have you had any family members/close contacts diagnosed with the COVID 19 since your procedure?  no   If yes to any of these questions please route to Joylene John, RN and Alphonsa Gin, Therapist, sports.

## 2019-07-11 ENCOUNTER — Encounter: Payer: Self-pay | Admitting: Gastroenterology

## 2019-07-12 ENCOUNTER — Encounter: Payer: Self-pay | Admitting: Physical Medicine & Rehabilitation

## 2019-07-17 ENCOUNTER — Other Ambulatory Visit: Payer: Self-pay | Admitting: Family Medicine

## 2019-07-26 ENCOUNTER — Other Ambulatory Visit: Payer: Self-pay | Admitting: Neurology

## 2019-07-26 MED ORDER — DULOXETINE HCL 20 MG PO CPEP: ORAL_CAPSULE | ORAL | 0 refills | Status: DC

## 2019-07-26 NOTE — Addendum Note (Signed)
Addended by: Cameron Sprang on: 07/26/2019 08:07 AM   Modules accepted: Orders

## 2019-07-26 NOTE — Telephone Encounter (Signed)
[place] patient.  Not seen in over a year.

## 2019-07-31 ENCOUNTER — Telehealth: Payer: Self-pay | Admitting: Physician Assistant

## 2019-07-31 NOTE — Telephone Encounter (Signed)
Agree, no SBE prophylaxis is needed.

## 2019-07-31 NOTE — Telephone Encounter (Signed)
Clinical pharmacist to review. I screened her previous office note, no previous history of valve surgery. Likely does not need SBE prophylaxis

## 2019-07-31 NOTE — Telephone Encounter (Signed)
1. What dental office are you calling from? [facility]  2. What is your office phone number? [phone]  3. What is your fax number? [phone]  4. What type of procedure is the patient having performed? fillings  5. What date is procedure scheduled or is the patient there now? 08/06/19 (if the patient is at the dentist's office question goes to their cardiologist if he/she is in the office.  If not, question should go to the DOD).   6. What is your question (ex. Antibiotics prior to procedure, holding medication-we need to know how long dentist wants pt to hold med)? Antibiotics prior to procedure

## 2019-08-01 NOTE — Telephone Encounter (Signed)
   Primary Cardiologist: [name], MD  Chart reviewed as part of pre-operative protocol coverage. Simple dental extractions and fillings are considered low risk procedures per guidelines and generally do not require any specific cardiac clearance. It is also generally accepted that for simple extractions, dental cleanings, and fillings, there is no need to interrupt blood thinner therapy.   SBE prophylaxis is not required for the patient.  I will route this recommendation to the requesting party via Epic fax function and remove from pre-op pool.  Please call with questions.  Abigail Butts, PA-C 08/01/2019, 7:50 AM

## 2019-08-02 ENCOUNTER — Other Ambulatory Visit: Payer: Self-pay | Admitting: Family Medicine

## 2019-08-02 ENCOUNTER — Telehealth (INDEPENDENT_AMBULATORY_CARE_PROVIDER_SITE_OTHER): Payer: PRIVATE HEALTH INSURANCE | Admitting: Physician Assistant

## 2019-08-02 ENCOUNTER — Other Ambulatory Visit: Payer: Self-pay

## 2019-08-02 ENCOUNTER — Encounter: Payer: Self-pay | Admitting: Physician Assistant

## 2019-08-02 VITALS — BP 116/72 | HR 81 | Ht 63.0 in | Wt 100.6 lb

## 2019-08-02 DIAGNOSIS — E785 Hyperlipidemia, unspecified: Secondary | ICD-10-CM

## 2019-08-02 DIAGNOSIS — I2510 Atherosclerotic heart disease of native coronary artery without angina pectoris: Secondary | ICD-10-CM

## 2019-08-02 DIAGNOSIS — I739 Peripheral vascular disease, unspecified: Secondary | ICD-10-CM

## 2019-08-02 DIAGNOSIS — Z7189 Other specified counseling: Secondary | ICD-10-CM

## 2019-08-02 DIAGNOSIS — R002 Palpitations: Secondary | ICD-10-CM

## 2019-08-02 NOTE — Progress Notes (Signed)
Virtual Visit via Telephone Note   This visit type was conducted due to national recommendations for restrictions regarding the COVID-19 Pandemic (e.g. social distancing) in an effort to limit this patient's exposure and mitigate transmission in our community.  Due to her co-morbid illnesses, this patient is at least at moderate risk for complications without adequate follow up.  This format is felt to be most appropriate for this patient at this time.  The patient did not have access to video technology/had technical difficulties with video requiring transitioning to audio format only (telephone).  All issues noted in this document were discussed and addressed.  No physical exam could be performed with this format.  The patient did verbally consent to telehealth for [facility].   Date:  08/02/2019   ID:  Erin [name], DOB July 21, 1956, MRN [id number]  Patient Location: Home Provider Location: Home  PCP:  [name], MD  Cardiologist:  [name], MD / Richardson Dopp, PA-C  Electrophysiologist:  None  Vascular surgeon: Dr. Oneida Alar   Evaluation Performed:  Follow-Up Visit  Chief Complaint: CAD  History of Present Illness:    Erin [name] is a 63 y.o. female with:   Coronary artery disease  S/p DES to mid RCA 09/2017  Managed with ASA + rivaroxaban 2.5 twice daily ([facility]PASS trial)  Heart failure with reduced ejection fraction with recovered LVF  Mixed ischemic/nonischemic cardiomyopathy  Echo 3/19: EF 55-60  Echo 12/18: EF 35-40  Peripheral arterial disease  S/p aortobifem and bilateral femoral to popliteal bypasses 07/2017  Chronic mesenteric ischemia  Hypokalemia  Anal CA s/p XRT 2014  Diabetes mellitus  Hyperlipidemia  The patient was last seen in 09/2018.  Today, she notes she is doing well.  She is the primary caregiver for her parents are both in their 34s.  She has not had chest discomfort, significant shortness of breath, orthopnea,  paroxysmal nocturnal dyspnea, leg swelling.  She does have occasional positional vertigo.  She takes meclizine.  She does note occasional palpitations described as flutters.  She feels that her heart rate is going fast.  It does not happen that often.  It may last couple of minutes.  She feels weak with this.  The patient does not have symptoms concerning for COVID-19 infection (fever, chills, cough, or new shortness of breath).    Past Medical History:  Diagnosis Date  . Allergy   . Alopecia   . Anal cancer ([place]) 08/14/13   invasive squamous cell ca, s/p radiation 10/20-11/26/14 60.4Gy/50fx and chemo  . Anxiety   . 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 B12 deficiency anemia 09/14/2015  . Blood transfusion without reported diagnosis   . BPPV (benign paroxysmal positional vertigo) 07/08/2015  . Cardiomyopathy ([place]) 11/03/2017  . Chronic back pain   . Chronic daily headache 03/29/2013   takes bc powder  . Closed right hip fracture ([place]) 09/10/2015  . Coronary artery disease involving native coronary artery of native heart without angina pectoris 10/13/2017   DES to mid RCA  . Depression   . DM (diabetes mellitus) ([place])    diet controlled; does not check BG  . Essential (hemorrhagic) thrombocythemia ([place]) 09/06/2018  . Family history of early CAD 04/08/2016  . GERD (gastroesophageal reflux disease)   . History of hiatal hernia   . Hot flashes   . Hyperlipidemia associated with type 2 diabetes mellitus ([place]) 05/11/2017  . Hypothyroidism 09/10/2015  . IBS (irritable bowel syndrome) 03/29/2013  . Neck pain 07/08/2015  .  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 Peripheral vascular disease ([place]) 11/03/2017  . QT prolongation   . Sacroiliitis ([place]) 09/06/2018  . Spasms of the hands or feet 10/08/2017  . Syncope 06/07/2016  . Tubular adenoma of colon 09/08/2003  . Vertigo   . Wears glasses    Past Surgical History:  Procedure Laterality Date  . ABDOMINAL AORTOGRAM N/A  08/02/2017   Procedure: ABDOMINAL AORTOGRAM;  Surgeon: Wellington Hampshire, MD;  Location: [facility];  Service: Cardiovascular;  Laterality: N/A;  . AORTA - BILATERAL FEMORAL ARTERY BYPASS GRAFT N/A 08/14/2017   Procedure: AORTA BIFEMORAL BYPASS GRAFT;  Surgeon: Rosetta Posner, MD;  Location: [facility] OR;  Service: Vascular;  Laterality: N/A;  . COLONOSCOPY    . CORONARY STENT INTERVENTION N/A 10/13/2017   Procedure: CORONARY STENT INTERVENTION;  Surgeon: Nelva Bush, MD;  Location: [place];  Service: Cardiovascular;  Laterality: N/A;  . dental implant    . ECTOPIC PREGNANCY SURGERY    . EMBOLECTOMY N/A 08/14/2017   Procedure: EMBOLECTOMY FEMORAL;  Surgeon: Rosetta Posner, MD;  Location: [facility] OR;  Service: Vascular;  Laterality: N/A;  . FEMORAL-POPLITEAL BYPASS GRAFT Right 08/14/2017   Procedure: Right Femoral to Above Knee Popliteal Bypass Graft using Non-Reversed Greater Saphenous Vein Graft from Right Leg;  Surgeon: Rosetta Posner, MD;  Location: [place];  Service: Vascular;  Laterality: Right;  . FLEXIBLE SIGMOIDOSCOPY N/A 08/14/2013   Procedure: FLEXIBLE SIGMOIDOSCOPY;  Surgeon: Ladene Artist, MD;  Location: [facility];  Service: Endoscopy;  Laterality: N/A;  . LEFT HEART CATH AND CORONARY ANGIOGRAPHY N/A 10/13/2017   Procedure: LEFT HEART CATH AND CORONARY ANGIOGRAPHY;  Surgeon: Nelva Bush, MD;  Location: [place] [facility];  Service: Cardiovascular;  Laterality: N/A;  . LOWER EXTREMITY ANGIOGRAPHY Bilateral 08/02/2017   Procedure: Lower Extremity Angiography;  Surgeon: Wellington Hampshire, MD;  Location: [place] [facility];  Service: Cardiovascular;  Laterality: Bilateral;  . MULTIPLE TOOTH EXTRACTIONS    . PERIPHERAL VASCULAR INTERVENTION  05/22/2018   Procedure: PERIPHERAL VASCULAR INTERVENTION;  Surgeon: Serafina Mitchell, MD;  Location: [facility];  Service: Cardiovascular;;  SMA and Celiac  . PILONIDAL CYST EXCISION    . POLYPECTOMY    . THROMBECTOMY FEMORAL  ARTERY Right 08/14/2017   Procedure: THROMBECTOMY FEMORAL ARTERY;  Surgeon: Rosetta Posner, MD;  Location: [facility];  Service: Vascular;  Laterality: Right;  . TONSILLECTOMY    . TOTAL HIP ARTHROPLASTY  09/11/2015   Procedure: TOTAL HIP ARTHROPLASTY;  Surgeon: Renette Butters, MD;  Location: [facility];  Service: Orthopedics;;  . ULTRASOUND GUIDANCE FOR VASCULAR ACCESS  10/13/2017   Procedure: Ultrasound Guidance For Vascular Access;  Surgeon: Nelva Bush, MD;  Location: [facility];  Service: Cardiovascular;;     Current Meds  Medication Sig  . acetaminophen (TYLENOL) 500 MG tablet Take 1,000 mg by mouth daily as needed for headache (pain).   Marland Kitchen acyclovir ointment (ZOVIRAX) 5 % Apply 1 application topically every 3 (three) hours as needed (fever blisters).  Marland Kitchen aspirin EC 81 MG tablet Take 81 mg by mouth daily.  . Cholecalciferol (VITAMIN D) 2000 units tablet Take 2,000 Units by mouth daily.  Marland Kitchen dicyclomine (BENTYL) 10 MG capsule TAKE 1 CAPSULE BY MOUTH 3 TIMES A DAY BEFORE MEALS  . DULoxetine (CYMBALTA) 20 MG capsule TAKE 2 CAPSULES BY MOUTH EVERY DAY  . fluticasone (FLONASE) 50 MCG/ACT nasal spray Place 1 spray into both nostrils daily.  Marland Kitchen  gabapentin (NEURONTIN) 600 MG tablet TAKE 1 TABLET BY MOUTH THREE TIMES DAILY GENERIC EQUIVALENT FOR NEURONTIN  . levothyroxine (SYNTHROID) 50 MCG tablet TAKE 1 TABLET BY MOUTH EVERY DAY  . linaclotide (LINZESS) 72 MCG capsule Take 1 capsule (72 mcg total) by mouth daily before breakfast.  . meclizine (ANTIVERT) 12.5 MG tablet Take 1 tablet (12.5 mg total) by mouth 3 (three) times daily as needed for dizziness.  . metoprolol succinate (TOPROL-XL) 25 MG 24 hr tablet TAKE 1 TABLET BY MOUTH TWICE A DAY  . nystatin-triamcinolone (MYCOLOG II) cream APPLY TO CORNERS OF THE MOUTH TWICE A DAY AS NEEDED FOR CRACKING/BURNING  . omeprazole (PRILOSEC) 40 MG capsule TAKE 1 CAPSULE BY MOUTH EVERY DAY  . potassium chloride SA (K-DUR,KLOR-CON) 20 MEQ tablet Take 1  tablet (20 mEq total) by mouth daily.  . rivaroxaban (XARELTO) 2.5 MG TABS tablet Take 1 tablet (2.5 mg total) by mouth 2 (two) times daily.  . rosuvastatin (CRESTOR) 40 MG tablet TAKE 1 TABLET BY MOUTH EVERY DAY  . vitamin B-12 1000 MCG tablet Take 1 tablet (1,000 mcg total) by mouth daily.     Allergies:   Amitiza [lubiprostone], Nortriptyline, Sulfa antibiotics, Tramadol, Atorvastatin, Claritin [loratadine], Dexamethasone, and Prednisone   Social History   Tobacco Use  . Smoking status: Former Smoker    Packs/day: 0.50    Types: Cigarettes    Quit date: 10/07/2014    Years since quitting: 4.8  . Smokeless tobacco: Never Used  Substance Use Topics  . Alcohol use: No    Alcohol/week: 0.0 standard drinks  . Drug use: No     Family Hx: The patient's family history includes Arthritis in her mother; Heart attack in her father, maternal grandfather, maternal uncle, and mother; Heart disease in her father and mother; Hyperlipidemia in her father and mother; Hypertension in her father and mother; Irritable bowel syndrome in her mother; Lung cancer (age of onset: 54) in her brother; Prostate cancer in her father; Stroke (age of onset: 73) in her mother; Stroke (age of onset: 74) in her father; Thyroid disease in her father and mother. There is no history of Colon cancer, Rectal cancer, Stomach cancer, or Esophageal cancer.  ROS:   Please see the history of present illness.    She has not had fever, cough, melena, hematochezia, hematuria. All other systems reviewed and are negative.   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 Elevated velocities seen in SMA and Celiac stents are higher than previously Recorded.   Echo 01/24/18 EF 55-60, no RWMA, normal diastolic function, trivial MR/TR  Echo 10/23/2017 EF 35-40  Cardiac  catheterization 10/13/2017 LAD mid 40 LCx normal RCA proximal 30, mid 99, 30, distal 50 EF 40-45 PCI: 2 x 12 mm Resolute Onyx DES to the mid RCA  Nuclear stress test 08/09/2017 EF 44, normal perfusion, low risk  Echo 06/08/2016 EF 55-60, normal wall motion, grade 1 diastolic dysfunction   Labs/Other Tests and Data Reviewed:    EKG:  No ECG reviewed.  Recent Labs: 05/01/2019: ALT 10; BUN 10; Creatinine, Ser 0.65; Hemoglobin 11.8; Platelets 277; Potassium 4.2; Sodium 140; TSH 2.070   Recent Lipid Panel Lab Results  Component Value Date/Time   CHOL 163 05/01/2019 02:05 PM   TRIG 242 (H) 05/01/2019 02:05 PM   HDL 38 (L) 05/01/2019 02:05 PM   CHOLHDL 4.3 05/01/2019  02:05 PM   CHOLHDL 9 09/26/2016 11:50 AM   LDLCALC 77 05/01/2019 02:05 PM   LDLDIRECT 214.0 09/26/2016 11:50 AM    Wt Readings from Last 3 Encounters:  08/02/19 100 lb 9.6 oz (45.6 kg)  07/02/19 100 lb (45.4 kg)  06/04/19 100 lb (45.4 kg)     Objective:    Vital Signs:  BP 116/72   Pulse 81   Ht 5\' 3"  (1.6 m)   Wt 100 lb 9.6 oz (45.6 kg)   BMI 17.82 kg/m    VITAL SIGNS:  reviewed GEN:  no acute distress RESPIRATORY:  No labored breathing NEURO:  Alert and oriented PSYCH:  Normal mood  ASSESSMENT & PLAN:    1. Coronary artery disease involving native coronary artery of native heart without angina pectoris History of drug-eluting stent to the mid RCA in November 2018.  She is maintained on low-dose rivaroxaban in addition to aspirin given the results of the [facility] trial.  She is tolerating rivaroxaban well.  Recent creatinine and hemoglobin were stable.  Continue current medical therapy.  Follow-up in 6 months.  2. Palpitations She notes occasional rapid palpitations.  We discussed whether or not to proceed with an event monitor to identify significant atrial arrhythmias such as atrial fibrillation.  She would like to hold off on this for now.  I have asked her to contact us if her symptoms become more  frequent.  3. Peripheral vascular disease ([place]) History of lower extremity revascularization surgery in September 2018 as well as chronic mesenteric ischemia.  She is followed chronically by vascular surgery.  Continue aspirin, statin therapy.  4. Hyperlipidemia LDL goal <70 Recent LDL 77.  Repeat CMET, lipids in 6 months.  If her LDL remains >70, add ezetimibe to high-dose rosuvastatin to aggressively manage lipids.  5. Educated About Covid-19 Virus Infection The signs and symptoms of COVID-19 were discussed with the patient and how to seek care for testing (follow up with PCP or arrange E-visit).  The importance of social distancing was discussed today.  Time:   Today, I have spent 14 minutes with the patient with telehealth technology discussing the above problems.     Medication Adjustments/Labs and Tests Ordered: Current medicines are reviewed at length with the patient today.  Concerns regarding medicines are outlined above.   Tests Ordered: Orders Placed This Encounter  Procedures  . Lipid panel  . Comprehensive metabolic panel    Medication Changes: No orders of the defined types were placed in this encounter.   Follow Up:  Virtual Visit or In Person in 6 month(s)  Signed, Richardson Dopp, PA-C  08/02/2019 12:46 PM    [place]

## 2019-08-02 NOTE — Patient Instructions (Addendum)
Medication Instructions:  Your physician recommends that you continue on your current medications as directed. Please refer to the Current Medication list given to you today.  If you need a refill on your cardiac medications before your next appointment, please call your pharmacy.   Lab work: TO BE DONE IN 6 MONTHS: LIPIDS, CMET on 01/31/20.   If you have labs (blood work) drawn today and your tests are completely normal, you will receive your results only by: Marland Kitchen MyChart Message (if you have MyChart) OR . A paper copy in the mail If you have any lab test that is abnormal or we need to change your treatment, we will call you to review the results.  Testing/Procedures: NONE  Follow-Up: Your physician recommends that you schedule a follow-up appointment with [facility], PA-C ON 01/31/20 AT 10:45 AM  Any Other Special Instructions Will Be Listed Below (If Applicable).  * If your palpitations get worse please call our office to let us know. [phone].

## 2019-08-06 ENCOUNTER — Encounter: Payer: Self-pay | Admitting: Family Medicine

## 2019-08-06 ENCOUNTER — Ambulatory Visit (INDEPENDENT_AMBULATORY_CARE_PROVIDER_SITE_OTHER): Payer: PRIVATE HEALTH INSURANCE | Admitting: Family Medicine

## 2019-08-06 ENCOUNTER — Other Ambulatory Visit: Payer: Self-pay

## 2019-08-06 VITALS — BP 118/70 | HR 68 | Temp 96.9°F | Wt 100.3 lb

## 2019-08-06 DIAGNOSIS — I2510 Atherosclerotic heart disease of native coronary artery without angina pectoris: Secondary | ICD-10-CM

## 2019-08-06 DIAGNOSIS — I7409 Other arterial embolism and thrombosis of abdominal aorta: Secondary | ICD-10-CM

## 2019-08-06 DIAGNOSIS — E785 Hyperlipidemia, unspecified: Secondary | ICD-10-CM

## 2019-08-06 DIAGNOSIS — E039 Hypothyroidism, unspecified: Secondary | ICD-10-CM | POA: Diagnosis not present

## 2019-08-06 DIAGNOSIS — R7309 Other abnormal glucose: Secondary | ICD-10-CM

## 2019-08-06 DIAGNOSIS — I5181 Takotsubo syndrome: Secondary | ICD-10-CM

## 2019-08-06 DIAGNOSIS — E1169 Type 2 diabetes mellitus with other specified complication: Secondary | ICD-10-CM | POA: Diagnosis not present

## 2019-08-06 DIAGNOSIS — I739 Peripheral vascular disease, unspecified: Secondary | ICD-10-CM

## 2019-08-06 DIAGNOSIS — F4321 Adjustment disorder with depressed mood: Secondary | ICD-10-CM

## 2019-08-06 NOTE — Progress Notes (Signed)
Virtual Visit via Video Note  I connected with Medha  on 08/06/19 at  3:00 PM EDT by a video enabled telemedicine application and verified that I am speaking with the correct person using two identifiers.  Location patient: home Location provider:work or home office Persons participating in the virtual visit: patient, provider  I discussed the limitations of evaluation and management by telemedicine and the availability of in person appointments. The patient expressed understanding and agreed to proceed.   HPI:  Erin [name] is a pleasant 63 y.o. here for follow up. Chronic medical problems summarized below were reviewed for changes and stability and were updated as needed below. These issues and their treatment remain stable for the most part. She still is living with her parents in [place]. She has had some mild depression during the pandemic but does not wish to make changes to her medications or to treatment for this. Declines CBT. Has srtong fairth and feels is ok. Denies SI, thoughts of harm or hallucinations. Wt is a little better. She saw talked with her cardiologist recently and reports will do a lab visit and in person visit with cardiology in a few months. Denies CP, SOB, DOE, treatment intolerance or new symptoms.  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 early for this, underwent stenting with Dr. Trula Slade in 06/2018  Hypokalemia: -hx of -takes potassium -did not tol  acei/arb   Depression/underweight/neuropathy/pain: -long hx neuropathy, sees neurology -had longstanding pain after vascular surgery -cymbalta helped all of the above, also on gabapentin -takes b12 for neuropathy, b12 def   ROS: See pertinent positives and negatives per HPI.  Past Medical History:  Diagnosis Date  . Allergy   . Alopecia   . Anal cancer ([place]) 08/14/13   invasive squamous cell ca, s/p radiation 10/20-11/26/14 60.4Gy/3fx and chemo  . Anxiety   . 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 B12 deficiency anemia 09/14/2015  . Blood transfusion without reported diagnosis   . BPPV (benign paroxysmal positional vertigo) 07/08/2015  . Cardiomyopathy ([place]) 11/03/2017  . Chronic back pain   . Chronic daily headache 03/29/2013   takes bc powder  . Closed right hip fracture ([place]) 09/10/2015  . Coronary artery disease involving native coronary artery of native heart without angina pectoris 10/13/2017   DES to mid RCA  . Depression   . DM (diabetes mellitus) ([place])    diet controlled; does not check BG  . Essential (hemorrhagic) thrombocythemia ([place]) 09/06/2018  . Family history of early CAD 04/08/2016  . GERD (gastroesophageal reflux disease)   . History of hiatal hernia   . Hot flashes   . Hyperlipidemia associated with type 2 diabetes mellitus ([place]) 05/11/2017  . Hypothyroidism 09/10/2015  . IBS (irritable bowel syndrome) 03/29/2013  . Neck pain 07/08/2015  . 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 Peripheral vascular disease ([place]) 11/03/2017  . QT prolongation   . Sacroiliitis ([place]) 09/06/2018  . Spasms of the hands or feet 10/08/2017  . Syncope 06/07/2016  . Tubular adenoma of colon 09/08/2003  . Vertigo   .  Wears glasses     Past Surgical History:  Procedure Laterality Date  . ABDOMINAL AORTOGRAM N/A 08/02/2017   Procedure: ABDOMINAL AORTOGRAM;  Surgeon: Wellington Hampshire, MD;  Location: [facility];  Service: Cardiovascular;  Laterality: N/A;   . AORTA - BILATERAL FEMORAL ARTERY BYPASS GRAFT N/A 08/14/2017   Procedure: AORTA BIFEMORAL BYPASS GRAFT;  Surgeon: Rosetta Posner, MD;  Location: [facility];  Service: Vascular;  Laterality: N/A;  . COLONOSCOPY    . CORONARY STENT INTERVENTION N/A 10/13/2017   Procedure: CORONARY STENT INTERVENTION;  Surgeon: Nelva Bush, MD;  Location: [facility];  Service: Cardiovascular;  Laterality: N/A;  . dental implant    . ECTOPIC PREGNANCY SURGERY    . EMBOLECTOMY N/A 08/14/2017   Procedure: EMBOLECTOMY FEMORAL;  Surgeon: Rosetta Posner, MD;  Location[facility];  Service: Vascular;  Laterality: N/A;  . FEMORAL-POPLITEAL BYPASS GRAFT Right 08/14/2017   Procedure: Right Femoral to Above Knee Popliteal Bypass Graft using Non-Reversed Greater Saphenous Vein Graft from Right Leg;  Surgeon: Rosetta Posner, MD;  Location: [facility];  Service: Vascular;  Laterality: Right;  . FLEXIBLE SIGMOIDOSCOPY N/A 08/14/2013   Procedure: FLEXIBLE SIGMOIDOSCOPY;  Surgeon: Ladene Artist, MD;  Location: [facility];  Service: Endoscopy;  Laterality: N/A;  . LEFT HEART CATH AND CORONARY ANGIOGRAPHY N/A 10/13/2017   Procedure: LEFT HEART CATH AND CORONARY ANGIOGRAPHY;  Surgeon: Nelva Bush, MD;  Location: [facility];  Service: Cardiovascular;  Laterality: N/A;  . LOWER EXTREMITY ANGIOGRAPHY Bilateral 08/02/2017   Procedure: Lower Extremity Angiography;  Surgeon: Wellington Hampshire, MD;  Location: [facility];  Service: Cardiovascular;  Laterality: Bilateral;  . MULTIPLE TOOTH EXTRACTIONS    . PERIPHERAL VASCULAR INTERVENTION  05/22/2018   Procedure: PERIPHERAL VASCULAR INTERVENTION;  Surgeon: Serafina Mitchell, MD;  Location: [facility];  Service: Cardiovascular;;  SMA and Celiac  . PILONIDAL CYST EXCISION    . POLYPECTOMY    . THROMBECTOMY FEMORAL ARTERY Right 08/14/2017   Procedure: THROMBECTOMY FEMORAL ARTERY;  Surgeon: Rosetta Posner, MD;  Location: [facility];  Service: Vascular;  Laterality: Right;  .  TONSILLECTOMY    . TOTAL HIP ARTHROPLASTY  09/11/2015   Procedure: TOTAL HIP ARTHROPLASTY;  Surgeon: Renette Butters, MD;  Location: [facility];  Service: Orthopedics;;  . ULTRASOUND GUIDANCE FOR VASCULAR ACCESS  10/13/2017   Procedure: Ultrasound Guidance For Vascular Access;  Surgeon: Nelva Bush, MD;  Location: [facility];  Service: Cardiovascular;;    Family History  Problem Relation Age of Onset  . Arthritis Mother   . Hyperlipidemia Mother   . Heart disease Mother   . Hypertension Mother   . Stroke Mother 32  . Irritable bowel syndrome Mother   . Thyroid disease Mother   . Heart attack Mother   . Heart disease Father   . Hyperlipidemia Father   . Hypertension Father   . Stroke Father 59  . Thyroid disease Father   . Prostate cancer Father   . Heart attack Father   . Lung cancer Brother 2  . Heart attack Maternal Grandfather   . Heart attack Maternal Uncle   . Colon cancer Neg Hx   . Rectal cancer Neg Hx   . Stomach cancer Neg Hx   . Esophageal cancer Neg Hx     SOCIAL HX:see hpi   Current Outpatient Medications:  .  acetaminophen (TYLENOL) 500 MG tablet, Take 1,000 mg by mouth daily as  needed for headache (pain). , Disp: , Rfl:  .  acyclovir ointment (ZOVIRAX) 5 %, Apply 1 application topically every 3 (three) hours as needed (fever blisters)., Disp: 15 g, Rfl: 3 .  aspirin EC 81 MG tablet, Take 81 mg by mouth daily., Disp: , Rfl:  .  Cholecalciferol (VITAMIN D) 2000 units tablet, Take 2,000 Units by mouth daily., Disp: , Rfl:  .  dicyclomine (BENTYL) 10 MG capsule, TAKE 1 CAPSULE BY MOUTH 3 TIMES A DAY BEFORE MEALS, Disp: 270 capsule, Rfl: 1 .  DULoxetine (CYMBALTA) 20 MG capsule, TAKE 2 CAPSULES BY MOUTH EVERY DAY, Disp: 180 capsule, Rfl: 0 .  fluticasone (FLONASE) 50 MCG/ACT nasal spray, Place 1 spray into both nostrils daily., Disp: 48 g, Rfl: 1 .  gabapentin (NEURONTIN) 600 MG tablet, TAKE 1 TABLET BY MOUTH THREE TIMES DAILY GENERIC EQUIVALENT FOR  NEURONTIN, Disp: 270 tablet, Rfl: 1 .  KLOR-CON M20 20 MEQ tablet, TAKE 1 TABLET BY MOUTH EVERY DAY, Disp: 90 tablet, Rfl: 1 .  levothyroxine (SYNTHROID) 50 MCG tablet, TAKE 1 TABLET BY MOUTH EVERY DAY, Disp: 90 tablet, Rfl: 1 .  linaclotide (LINZESS) 72 MCG capsule, Take 1 capsule (72 mcg total) by mouth daily before breakfast., Disp: 30 capsule, Rfl: 11 .  meclizine (ANTIVERT) 12.5 MG tablet, Take 1 tablet (12.5 mg total) by mouth 3 (three) times daily as needed for dizziness., Disp: 30 tablet, Rfl: 5 .  metoprolol succinate (TOPROL-XL) 25 MG 24 hr tablet, TAKE 1 TABLET BY MOUTH TWICE A DAY, Disp: 180 tablet, Rfl: 0 .  Multiple Vitamins-Minerals (PRESERVISION AREDS PO), Take by mouth daily., Disp: , Rfl:  .  nystatin-triamcinolone (MYCOLOG II) cream, APPLY TO CORNERS OF THE MOUTH TWICE A DAY AS NEEDED FOR CRACKING/BURNING, Disp: , Rfl: 2 .  omeprazole (PRILOSEC) 40 MG capsule, TAKE 1 CAPSULE BY MOUTH EVERY DAY, Disp: 90 capsule, Rfl: 1 .  rivaroxaban (XARELTO) 2.5 MG TABS tablet, Take 1 tablet (2.5 mg total) by mouth 2 (two) times daily., Disp: 60 tablet, Rfl: 11 .  rosuvastatin (CRESTOR) 40 MG tablet, TAKE 1 TABLET BY MOUTH EVERY DAY, Disp: 90 tablet, Rfl: 3 .  vitamin B-12 1000 MCG tablet, Take 1 tablet (1,000 mcg total) by mouth daily., Disp: 30 tablet, Rfl: 0 .  nitroGLYCERIN (NITROSTAT) 0.4 MG SL tablet, Place 1 tablet (0.4 mg total) under the tongue every 5 (five) minutes as needed for chest pain., Disp: 100 tablet, Rfl: 1  EXAM:  VITALS per patient if applicable:  GENERAL: alert, oriented, appears well and in no acute distress  HEENT: atraumatic, conjunttiva clear, no obvious abnormalities on inspection of external nose and ears  NECK: normal movements of the head and neck  LUNGS: on inspection no signs of respiratory distress, breathing rate appears normal, no obvious gross SOB, gasping or wheezing  CV: no obvious cyanosis  MS: moves all visible extremities without noticeable  abnormality  PSYCH/NEURO: pleasant and cooperative, no obvious depression or anxiety, speech and thought processing grossly intact  ASSESSMENT AND PLAN:  Discussed the following assessment and plan:  Adjustment disorder with depressed mood  Hyperlipidemia associated with type 2 diabetes mellitus (HCC)  Hypothyroidism, unspecified type  Elevated glucose  Coronary artery disease involving native coronary artery of native heart without angina pectoris  Stress-induced cardiomyopathy  Peripheral vascular disease (HCC)  Aortoiliac occlusive disease ([place])  -we discussed options for the mood changes during the pandemic. Declines changes in medication or CBT. Feels is doing ok. 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 But, she has good support form husband and parents. -opted to defer labs today. She wishes to do labs next time at [facility] following her next follow up visit -encouraged regular meals and keeping weight up. She is also watching her cholesterol.   -follow up in the interim as needed. I discussed the assessment and treatment plan with the patient. The patient was provided an opportunity to ask questions and all were answered. The patient agreed with the plan and demonstrated an understanding of the instructions.   The patient was advised to call back or seek an in-person evaluation if the symptoms worsen or if the condition fails to improve as anticipated.   Lucretia Kern, DO

## 2019-08-13 ENCOUNTER — Other Ambulatory Visit: Payer: Self-pay | Admitting: Cardiovascular Disease

## 2019-08-13 ENCOUNTER — Encounter: Payer: Self-pay | Admitting: *Deleted

## 2019-08-13 ENCOUNTER — Encounter: Payer: PRIVATE HEALTH INSURANCE | Admitting: Physical Medicine & Rehabilitation

## 2019-09-18 ENCOUNTER — Encounter: Payer: PRIVATE HEALTH INSURANCE | Admitting: Physical Medicine & Rehabilitation

## 2019-10-27 ENCOUNTER — Other Ambulatory Visit: Payer: Self-pay | Admitting: Physician Assistant

## 2019-10-27 ENCOUNTER — Other Ambulatory Visit: Payer: Self-pay | Admitting: Family Medicine

## 2019-10-28 NOTE — Telephone Encounter (Signed)
Pt's age 63, wt 45.5 kg, SCr 0.65, CrCl 63.63, last ov w/ Richardson Dopp, PA 08/02/19.

## 2019-11-01 ENCOUNTER — Encounter: Payer: PRIVATE HEALTH INSURANCE | Admitting: Family Medicine

## 2019-11-05 ENCOUNTER — Telehealth (INDEPENDENT_AMBULATORY_CARE_PROVIDER_SITE_OTHER): Payer: PRIVATE HEALTH INSURANCE | Admitting: Family Medicine

## 2019-11-05 ENCOUNTER — Other Ambulatory Visit: Payer: Self-pay

## 2019-11-05 ENCOUNTER — Telehealth: Payer: Self-pay | Admitting: *Deleted

## 2019-11-05 VITALS — BP 117/65 | HR 59 | Temp 97.2°F | Wt 100.0 lb

## 2019-11-05 DIAGNOSIS — I739 Peripheral vascular disease, unspecified: Secondary | ICD-10-CM

## 2019-11-05 DIAGNOSIS — E1169 Type 2 diabetes mellitus with other specified complication: Secondary | ICD-10-CM

## 2019-11-05 DIAGNOSIS — E538 Deficiency of other specified B group vitamins: Secondary | ICD-10-CM

## 2019-11-05 DIAGNOSIS — D519 Vitamin B12 deficiency anemia, unspecified: Secondary | ICD-10-CM | POA: Diagnosis not present

## 2019-11-05 DIAGNOSIS — I2510 Atherosclerotic heart disease of native coronary artery without angina pectoris: Secondary | ICD-10-CM

## 2019-11-05 DIAGNOSIS — E039 Hypothyroidism, unspecified: Secondary | ICD-10-CM

## 2019-11-05 DIAGNOSIS — E119 Type 2 diabetes mellitus without complications: Secondary | ICD-10-CM

## 2019-11-05 DIAGNOSIS — R7309 Other abnormal glucose: Secondary | ICD-10-CM | POA: Diagnosis not present

## 2019-11-05 DIAGNOSIS — E785 Hyperlipidemia, unspecified: Secondary | ICD-10-CM

## 2019-11-05 DIAGNOSIS — E876 Hypokalemia: Secondary | ICD-10-CM

## 2019-11-05 DIAGNOSIS — F325 Major depressive disorder, single episode, in full remission: Secondary | ICD-10-CM

## 2019-11-05 MED ORDER — LEVOTHYROXINE SODIUM 50 MCG PO TABS: 50.0000 ug | ORAL_TABLET | Freq: Every day | ORAL | 1 refills | Status: DC

## 2019-11-05 MED ORDER — OMEPRAZOLE 40 MG PO CPDR: DELAYED_RELEASE_CAPSULE | ORAL | 1 refills | Status: DC

## 2019-11-05 NOTE — Telephone Encounter (Signed)
Lab orders entered per Dr Maudie Mercury and faxed to [facility] at [phone].  Spoke with the pt and informed her of this also.

## 2019-11-05 NOTE — Progress Notes (Signed)
Virtual Visit via Video Note  I connected with Erin [name]  on 11/05/19 at 10:40 AM EST by a video enabled telemedicine application and verified that I am speaking with the correct person using two identifiers.  Location patient: home Location provider:work or home office Persons participating in the virtual visit: patient, provider  I discussed the limitations of evaluation and management by telemedicine and the availability of in person appointments. The patient expressed understanding and agreed to proceed.   HPI:  Erin [name] is a very pleasant 63 yo seen in follow up. PMH is detailed below and updated as needed. Reports is doing fairly well. Mood, bowels ok. Energy chronically low. Caring for parents. Fairly isolated during the pandemic. Seeing her cardiologist soon and will be getting labs there. She thinks can do all labs there, but if not asks that we fax orders we want to [place]. Requests several refills. 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 early for this, underwent stenting with Dr. Trula Slade in 06/2018  Hypokalemia: -hx of -takes potassium -did not tol acei/arb   Depression/underweight/neuropathy/pain: -long hx neuropathy, sees neurology -had longstanding pain after vascular surgery -cymbalta helped all of the above -takes b12 for neuropathy, b12 def  ROS: See pertinent positives and negatives per HPI.  Past  Medical History:  Diagnosis Date  . Allergy   . Alopecia   . Anal cancer ([place]) 08/14/13   invasive squamous cell ca, s/p radiation 10/20-11/26/14 60.4Gy/46fx and chemo  . Anxiety   . 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 B12 deficiency anemia 09/14/2015  . Blood transfusion without reported diagnosis   . BPPV (benign paroxysmal positional vertigo) 07/08/2015  . Cardiomyopathy ([place]) 11/03/2017  . Chronic back pain   . Chronic daily headache 03/29/2013   takes bc powder  . Closed right hip fracture ([place]) 09/10/2015  . Coronary artery disease involving native coronary artery of native heart without angina pectoris 10/13/2017   DES to mid RCA  . Depression   . DM (diabetes mellitus) ([place])    diet controlled; does not check BG  . Essential (hemorrhagic) thrombocythemia ([place]) 09/06/2018  . Family history of early CAD 04/08/2016  . GERD (gastroesophageal reflux disease)   . History of hiatal hernia   . Hot flashes   . Hyperlipidemia associated with type 2 diabetes mellitus ([place]) 05/11/2017  . Hypothyroidism 09/10/2015  . IBS (irritable bowel syndrome) 03/29/2013  . Neck pain 07/08/2015  . 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 Peripheral vascular disease ([place]) 11/03/2017  . QT prolongation   . Sacroiliitis ([place]) 09/06/2018  . Spasms of the hands or feet 10/08/2017  . Syncope 06/07/2016  . Tubular adenoma of colon 09/08/2003  . Vertigo   . Wears glasses     Past Surgical History:  Procedure Laterality Date  . ABDOMINAL AORTOGRAM N/A 08/02/2017   Procedure: ABDOMINAL AORTOGRAM;  Surgeon: Wellington Hampshire, MD;  Location: [facility];  Service: Cardiovascular;  Laterality: N/A;  . AORTA - BILATERAL FEMORAL ARTERY BYPASS  GRAFT N/A 08/14/2017   Procedure: AORTA BIFEMORAL BYPASS GRAFT;  Surgeon: Rosetta Posner, MD;  Location: [facility] OR;  Service: Vascular;  Laterality: N/A;  . COLONOSCOPY    . CORONARY STENT INTERVENTION N/A 10/13/2017   Procedure: CORONARY STENT  INTERVENTION;  Surgeon: Nelva Bush, MD;  Location: [facility];  Service: Cardiovascular;  Laterality: N/A;  . dental implant    . ECTOPIC PREGNANCY SURGERY    . EMBOLECTOMY N/A 08/14/2017   Procedure: EMBOLECTOMY FEMORAL;  Surgeon: Rosetta Posner, MD;  Location: [facility] OR;  Service: Vascular;  Laterality: N/A;  . FEMORAL-POPLITEAL BYPASS GRAFT Right 08/14/2017   Procedure: Right Femoral to Above Knee Popliteal Bypass Graft using Non-Reversed Greater Saphenous Vein Graft from Right Leg;  Surgeon: Rosetta Posner, MD;  Location: [facility];  Service: Vascular;  Laterality: Right;  . FLEXIBLE SIGMOIDOSCOPY N/A 08/14/2013   Procedure: FLEXIBLE SIGMOIDOSCOPY;  Surgeon: Ladene Artist, MD;  [facility]: [facility];  Service: Endoscopy;  Laterality: N/A;  . LEFT HEART CATH AND CORONARY ANGIOGRAPHY N/A 10/13/2017   Procedure: LEFT HEART CATH AND CORONARY ANGIOGRAPHY;  Surgeon: Nelva Bush, MD;  Location: [facility];  Service: Cardiovascular;  Laterality: N/A;  . LOWER EXTREMITY ANGIOGRAPHY Bilateral 08/02/2017   Procedure: Lower Extremity Angiography;  Surgeon: Wellington Hampshire, MD;  Location: [facility];  Service: Cardiovascular;  Laterality: Bilateral;  . MULTIPLE TOOTH EXTRACTIONS    . PERIPHERAL VASCULAR INTERVENTION  05/22/2018   Procedure: PERIPHERAL VASCULAR INTERVENTION;  Surgeon: Serafina Mitchell, MD;  Location: [facility];  Service: Cardiovascular;;  SMA and Celiac  . PILONIDAL CYST EXCISION    . POLYPECTOMY    . THROMBECTOMY FEMORAL ARTERY Right 08/14/2017   Procedure: THROMBECTOMY FEMORAL ARTERY;  Surgeon: Rosetta Posner, MD;  Location: [facility];  Service: Vascular;  Laterality: Right;  . TONSILLECTOMY    . TOTAL HIP ARTHROPLASTY  09/11/2015   Procedure: TOTAL HIP ARTHROPLASTY;  Surgeon: Renette Butters, MD;  Location: [facility];  Service: Orthopedics;;  . ULTRASOUND GUIDANCE FOR VASCULAR ACCESS  10/13/2017   Procedure: Ultrasound Guidance For Vascular Access;  Surgeon:  Nelva Bush, MD;  Location: [facility];  Service: Cardiovascular;;    Family History  Problem Relation Age of Onset  . Arthritis Mother   . Hyperlipidemia Mother   . Heart disease Mother   . Hypertension Mother   . Stroke Mother 62  . Irritable bowel syndrome Mother   . Thyroid disease Mother   . Heart attack Mother   . Heart disease Father   . Hyperlipidemia Father   . Hypertension Father   . Stroke Father 20  . Thyroid disease Father   . Prostate cancer Father   . Heart attack Father   . Lung cancer Brother 20  . Heart attack Maternal Grandfather   . Heart attack Maternal Uncle   . Colon cancer Neg Hx   . Rectal cancer Neg Hx   . Stomach cancer Neg Hx   . Esophageal cancer Neg Hx     SOCIAL HX:see hpi, living with parents and caring for them during pandemic   Current Outpatient Medications:  .  acetaminophen (TYLENOL) 500 MG tablet, Take 1,000 mg by mouth daily as needed for headache (pain). , Disp: , Rfl:  .  acyclovir ointment (ZOVIRAX) 5 %, Apply 1 application topically every 3 (three) hours as needed (fever blisters)., Disp: 15 g, Rfl: 3 .  aspirin EC 81 MG tablet, Take  81 mg by mouth daily., Disp: , Rfl:  .  Cholecalciferol (VITAMIN D) 2000 units tablet, Take 2,000 Units by mouth daily., Disp: , Rfl:  .  dicyclomine (BENTYL) 10 MG capsule, TAKE 1 CAPSULE BY MOUTH 3 TIMES A DAY BEFORE MEALS, Disp: 270 capsule, Rfl: 1 .  DULoxetine (CYMBALTA) 20 MG capsule, TAKE 2 CAPSULES BY MOUTH EVERY DAY, Disp: 180 capsule, Rfl: 0 .  fluticasone (FLONASE) 50 MCG/ACT nasal spray, Place 1 spray into both nostrils daily., Disp: 48 g, Rfl: 1 .  gabapentin (NEURONTIN) 600 MG tablet, TAKE 1 TABLET BY MOUTH THREE TIMES DAILY GENERIC EQUIVALENT FOR NEURONTIN, Disp: 270 tablet, Rfl: 1 .  KLOR-CON M20 20 MEQ tablet, TAKE 1 TABLET BY MOUTH EVERY DAY, Disp: 90 tablet, Rfl: 1 .  levothyroxine (SYNTHROID) 50 MCG tablet, Take 1 tablet (50 mcg total) by mouth daily., Disp: 90 tablet,  Rfl: 1 .  linaclotide (LINZESS) 72 MCG capsule, Take 1 capsule (72 mcg total) by mouth daily before breakfast., Disp: 30 capsule, Rfl: 11 .  meclizine (ANTIVERT) 12.5 MG tablet, Take 1 tablet (12.5 mg total) by mouth 3 (three) times daily as needed for dizziness., Disp: 30 tablet, Rfl: 5 .  metoprolol succinate (TOPROL-XL) 25 MG 24 hr tablet, TAKE 1 TABLET BY MOUTH TWICE A DAY, Disp: 180 tablet, Rfl: 3 .  Multiple Vitamins-Minerals (PRESERVISION AREDS PO), Take by mouth daily., Disp: , Rfl:  .  nitroGLYCERIN (NITROSTAT) 0.4 MG SL tablet, Place 1 tablet (0.4 mg total) under the tongue every 5 (five) minutes as needed for chest pain., Disp: 100 tablet, Rfl: 1 .  nystatin-triamcinolone (MYCOLOG II) cream, APPLY TO CORNERS OF THE MOUTH TWICE A DAY AS NEEDED FOR CRACKING/BURNING, Disp: , Rfl: 2 .  omeprazole (PRILOSEC) 40 MG capsule, TAKE 1 CAPSULE BY MOUTH EVERY DAY, Disp: 90 capsule, Rfl: 1 .  rosuvastatin (CRESTOR) 40 MG tablet, TAKE 1 TABLET BY MOUTH EVERY DAY, Disp: 90 tablet, Rfl: 3 .  vitamin B-12 1000 MCG tablet, Take 1 tablet (1,000 mcg total) by mouth daily., Disp: 30 tablet, Rfl: 0 .  XARELTO 2.5 MG TABS tablet, TAKE 1 TABLET BY MOUTH TWICE A DAY, Disp: 120 tablet, Rfl: 0  EXAM:  VITALS per patient if applicable: Vitals:   [id number]  BP: 117/65  Pulse: (!) 59  Temp: (!) 97.2 F (36.2 C)   Body mass index is 17.71 kg/m.   GENERAL: alert, oriented, appears well and in no acute distress  HEENT: atraumatic, conjunttiva clear, no obvious abnormalities on inspection of external nose and ears  NECK: normal movements of the head and neck  LUNGS: on inspection no signs of respiratory distress, breathing rate appears normal, no obvious gross SOB, gasping or wheezing  CV: no obvious cyanosis  MS: moves all visible extremities without noticeable abnormality  PSYCH/NEURO: pleasant and cooperative, no obvious depression or anxiety, speech and thought processing grossly  intact  ASSESSMENT AND PLAN:  Discussed the following assessment and plan:  Hyperlipidemia associated with type 2 diabetes mellitus (HCC)  Hypothyroidism, unspecified type  Elevated glucose  Anemia due to vitamin B12 deficiency, unspecified B12 deficiency type  Hypokalemia  Peripheral vascular disease (HCC)  Coronary artery disease involving native coronary artery of native heart without angina pectoris  Major depressive disorder in full remission, unspecified whether recurrent ([place])  -refills sent -sent message to staff to assist with TSH, Hgba1c, urine micro/alb cr, BMP, CBC orders to [facility] -reviewed HM due -lifestyle recs -follow up with PCP or me in  3-4 months   I discussed the assessment and treatment plan with the patient. The patient was provided an opportunity to ask questions and all were answered. The patient agreed with the plan and demonstrated an understanding of the instructions.   The patient was advised to call back or seek an in-person evaluation if the symptoms worsen or if the condition fails to improve as anticipated.   Lucretia Kern, DO

## 2019-11-08 ENCOUNTER — Other Ambulatory Visit: Payer: Self-pay | Admitting: Family Medicine

## 2019-11-08 DIAGNOSIS — E039 Hypothyroidism, unspecified: Secondary | ICD-10-CM

## 2019-11-09 ENCOUNTER — Other Ambulatory Visit: Payer: Self-pay | Admitting: Neurology

## 2019-11-09 LAB — CBC WITH DIFFERENTIAL/PLATELET
Basophils Absolute: 0 10*3/uL (ref 0.0–0.2)
Basos: 0 %
EOS (ABSOLUTE): 0 10*3/uL (ref 0.0–0.4)
Eos: 1 %
Hematocrit: 35.7 % (ref 34.0–46.6)
Hemoglobin: 11.9 g/dL (ref 11.1–15.9)
Immature Grans (Abs): 0 10*3/uL (ref 0.0–0.1)
Immature Granulocytes: 0 %
Lymphocytes Absolute: 1.1 10*3/uL (ref 0.7–3.1)
Lymphs: 17 %
MCH: 33.1 pg — ABNORMAL HIGH (ref 26.6–33.0)
MCHC: 33.3 g/dL (ref 31.5–35.7)
MCV: 99 fL — ABNORMAL HIGH (ref 79–97)
Monocytes Absolute: 0.7 10*3/uL (ref 0.1–0.9)
Monocytes: 10 %
Neutrophils Absolute: 4.8 10*3/uL (ref 1.4–7.0)
Neutrophils: 72 %
Platelets: 250 10*3/uL (ref 150–450)
RBC: 3.59 x10E6/uL — ABNORMAL LOW (ref 3.77–5.28)
RDW: 11.8 % (ref 11.7–15.4)
WBC: 6.7 10*3/uL (ref 3.4–10.8)

## 2019-11-09 LAB — BASIC METABOLIC PANEL
BUN/Creatinine Ratio: 25 (ref 12–28)
BUN: 18 mg/dL (ref 8–27)
CO2: 23 mmol/L (ref 20–29)
Calcium: 8.8 mg/dL (ref 8.7–10.3)
Chloride: 99 mmol/L (ref 96–106)
Creatinine, Ser: 0.71 mg/dL (ref 0.57–1.00)
GFR calc Af Amer: 105 mL/min/{1.73_m2} (ref >59)
GFR calc non Af Amer: 91 mL/min/{1.73_m2} (ref >59)
Glucose: 80 mg/dL (ref 65–99)
Potassium: 4.3 mmol/L (ref 3.5–5.2)
Sodium: 135 mmol/L (ref 134–144)

## 2019-11-09 LAB — HGB A1C W/O EAG: Hgb A1c MFr Bld: 5.1 % (ref 4.8–5.6)

## 2019-11-09 LAB — MICROALBUMIN, URINE: Microalbumin, Urine: 23.7 ug/mL

## 2019-11-09 LAB — TSH: TSH: 0.225 u[IU]/mL — ABNORMAL LOW (ref 0.450–4.500)

## 2019-11-09 LAB — VITAMIN B12: Vitamin B-12: 758 pg/mL (ref 232–1245)

## 2019-11-12 ENCOUNTER — Other Ambulatory Visit: Payer: Self-pay | Admitting: Family Medicine

## 2019-11-12 MED ORDER — LEVOTHYROXINE SODIUM 50 MCG PO TABS: ORAL_TABLET | ORAL | 1 refills | Status: DC

## 2019-11-12 NOTE — Addendum Note (Signed)
Addended by: Agnes Lawrence on: 11/12/2019 02:33 PM   Modules accepted: Orders

## 2019-11-12 NOTE — Addendum Note (Signed)
Addended by: Agnes Lawrence on: 11/12/2019 02:37 PM   Modules accepted: Orders

## 2019-11-28 ENCOUNTER — Other Ambulatory Visit: Payer: Self-pay | Admitting: Neurology

## 2019-11-28 ENCOUNTER — Other Ambulatory Visit: Payer: Self-pay

## 2019-11-28 MED ORDER — DULOXETINE HCL 20 MG PO CPEP: ORAL_CAPSULE | ORAL | 0 refills | Status: DC

## 2019-11-28 NOTE — Telephone Encounter (Signed)
[facility] month supply [facility] [place] until appt that is scheduled with [facility] 12/16/19

## 2019-11-28 NOTE — Telephone Encounter (Signed)
Patient called in to get a vv scheduled and to get a refill on her Duloxetine medication. She asked for the [facility] [place]. She has 3 pills left. Thank you

## 2019-12-16 ENCOUNTER — Telehealth (INDEPENDENT_AMBULATORY_CARE_PROVIDER_SITE_OTHER): Payer: 59 | Admitting: Neurology

## 2019-12-16 ENCOUNTER — Other Ambulatory Visit: Payer: Self-pay

## 2019-12-16 DIAGNOSIS — R519 Headache, unspecified: Secondary | ICD-10-CM

## 2019-12-16 DIAGNOSIS — G629 Polyneuropathy, unspecified: Secondary | ICD-10-CM

## 2019-12-16 MED ORDER — DULOXETINE HCL 20 MG PO CPEP: ORAL_CAPSULE | ORAL | 3 refills | Status: DC
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
## 2019-12-18 ENCOUNTER — Telehealth: Payer: Self-pay | Admitting: Gastroenterology

## 2019-12-18 DIAGNOSIS — K2970 Gastritis, unspecified, without bleeding: Secondary | ICD-10-CM

## 2019-12-18 DIAGNOSIS — R112 Nausea with vomiting, unspecified: Secondary | ICD-10-CM

## 2019-12-18 MED ORDER — DICYCLOMINE HCL 10 MG PO CAPS: ORAL_CAPSULE | ORAL | 1 refills | Status: DC

## 2019-12-18 NOTE — Telephone Encounter (Signed)
Prescription sent to patient's pharmacy.

## 2019-12-25 ENCOUNTER — Other Ambulatory Visit: Payer: Self-pay | Admitting: Physician Assistant

## 2019-12-25 NOTE — Telephone Encounter (Signed)
Pt last saw Richardson Dopp, PA on 08/02/19, last labs 11/08/19 Creat 0.71, age 64, weight 45.4kg, CrCl 58.13.

## 2019-12-25 NOTE — Telephone Encounter (Signed)
Dose appropriate for CAD indication, refill sent in

## 2020-01-25 ENCOUNTER — Other Ambulatory Visit: Payer: Self-pay | Admitting: Family Medicine

## 2020-01-31 ENCOUNTER — Ambulatory Visit: Payer: PRIVATE HEALTH INSURANCE | Admitting: Physician Assistant

## 2020-01-31 ENCOUNTER — Other Ambulatory Visit: Payer: PRIVATE HEALTH INSURANCE

## 2020-02-07 ENCOUNTER — Other Ambulatory Visit: Payer: PRIVATE HEALTH INSURANCE

## 2020-02-07 ENCOUNTER — Ambulatory Visit: Payer: PRIVATE HEALTH INSURANCE | Admitting: Physician Assistant

## 2020-02-12 ENCOUNTER — Other Ambulatory Visit: Payer: Self-pay

## 2020-02-12 ENCOUNTER — Ambulatory Visit (INDEPENDENT_AMBULATORY_CARE_PROVIDER_SITE_OTHER): Payer: 59 | Admitting: Family Medicine

## 2020-02-12 ENCOUNTER — Encounter: Payer: Self-pay | Admitting: Family Medicine

## 2020-02-12 VITALS — BP 98/60 | HR 59 | Temp 97.8°F | Ht 64.0 in | Wt 103.6 lb

## 2020-02-12 DIAGNOSIS — E876 Hypokalemia: Secondary | ICD-10-CM

## 2020-02-12 DIAGNOSIS — E039 Hypothyroidism, unspecified: Secondary | ICD-10-CM | POA: Diagnosis not present

## 2020-02-12 DIAGNOSIS — Z0000 Encounter for general adult medical examination without abnormal findings: Secondary | ICD-10-CM

## 2020-02-12 DIAGNOSIS — K219 Gastro-esophageal reflux disease without esophagitis: Secondary | ICD-10-CM

## 2020-02-12 DIAGNOSIS — E538 Deficiency of other specified B group vitamins: Secondary | ICD-10-CM

## 2020-02-12 DIAGNOSIS — Z1231 Encounter for screening mammogram for malignant neoplasm of breast: Secondary | ICD-10-CM

## 2020-02-12 DIAGNOSIS — R739 Hyperglycemia, unspecified: Secondary | ICD-10-CM

## 2020-02-12 DIAGNOSIS — E785 Hyperlipidemia, unspecified: Secondary | ICD-10-CM

## 2020-02-12 DIAGNOSIS — R7309 Other abnormal glucose: Secondary | ICD-10-CM

## 2020-02-12 DIAGNOSIS — I2510 Atherosclerotic heart disease of native coronary artery without angina pectoris: Secondary | ICD-10-CM

## 2020-02-12 DIAGNOSIS — R7989 Other specified abnormal findings of blood chemistry: Secondary | ICD-10-CM

## 2020-02-12 LAB — CBC WITH DIFFERENTIAL/PLATELET
Basophils Absolute: 0 10*3/uL (ref 0.0–0.1)
Basophils Relative: 0.4 % (ref 0.0–3.0)
Eosinophils Absolute: 0.1 10*3/uL (ref 0.0–0.7)
Eosinophils Relative: 1.1 % (ref 0.0–5.0)
HCT: 37 % (ref 36.0–46.0)
Hemoglobin: 12.7 g/dL (ref 12.0–15.0)
Lymphocytes Relative: 17.4 % (ref 12.0–46.0)
Lymphs Abs: 1.2 10*3/uL (ref 0.7–4.0)
MCHC: 34.2 g/dL (ref 30.0–36.0)
MCV: 100.9 fl — ABNORMAL HIGH (ref 78.0–100.0)
Monocytes Absolute: 0.7 10*3/uL (ref 0.1–1.0)
Monocytes Relative: 10.6 % (ref 3.0–12.0)
Neutro Abs: 4.8 10*3/uL (ref 1.4–7.7)
Neutrophils Relative %: 70.5 % (ref 43.0–77.0)
Platelets: 281 10*3/uL (ref 150.0–400.0)
RBC: 3.67 Mil/uL — ABNORMAL LOW (ref 3.87–5.11)
RDW: 12.3 % (ref 11.5–15.5)
WBC: 6.8 10*3/uL (ref 4.0–10.5)

## 2020-02-12 LAB — MICROALBUMIN / CREATININE URINE RATIO
Creatinine,U: 57.3 mg/dL
Microalb Creat Ratio: 3.7 mg/g (ref 0.0–30.0)
Microalb, Ur: 2.1 mg/dL — ABNORMAL HIGH (ref 0.0–1.9)

## 2020-02-12 LAB — COMPREHENSIVE METABOLIC PANEL
ALT: 6 U/L (ref 0–35)
AST: 14 U/L (ref 0–37)
Albumin: 4.3 g/dL (ref 3.5–5.2)
Alkaline Phosphatase: 100 U/L (ref 39–117)
BUN: 10 mg/dL (ref 6–23)
CO2: 28 mEq/L (ref 19–32)
Calcium: 9.1 mg/dL (ref 8.4–10.5)
Chloride: 102 mEq/L (ref 96–112)
Creatinine, Ser: 0.8 mg/dL (ref 0.40–1.20)
GFR: 72.34 mL/min (ref >60.00)
Glucose, Bld: 82 mg/dL (ref 70–99)
Potassium: 4.3 mEq/L (ref 3.5–5.1)
Sodium: 135 mEq/L (ref 135–145)
Total Bilirubin: 0.3 mg/dL (ref 0.2–1.2)
Total Protein: 6.6 g/dL (ref 6.0–8.3)

## 2020-02-12 LAB — HEMOGLOBIN A1C: Hgb A1c MFr Bld: 5.4 % (ref 4.6–6.5)

## 2020-02-12 LAB — VITAMIN B12: Vitamin B-12: 655 pg/mL (ref 211–911)

## 2020-02-12 LAB — TSH: TSH: 7.16 u[IU]/mL — ABNORMAL HIGH (ref 0.35–4.50)

## 2020-02-12 NOTE — Progress Notes (Signed)
Erin [name] DOB: 11-24-1955 Encounter date: 02/12/2020  This is a 64 y.o. female who presents for complete physical   History of present illness/Additional concerns: Has restless legs. Gets up, sprays muscle spray, uses leg cream. Feels like that works. Has had this for a long while. She is happy with home treatment.   Hyperglycemia/borderline: -dx in 2014, well controlled -no meds  Hypothyroidism: -meds: levothyroxine  IBS/GERD/Chronic Mesenteric Ischemia(see below): -sees GI (Dr. Fuller Plan) -meds: linzess and bentyl - takes the bentyl TID which helps relax stomach. Linzess is too strong right now so just takes once or twice a week as needed. amitiza caused severe diarrhea that she couldn't control. Miralax didn't work for her.   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 early for this, underwent stenting with Dr. Trula Slade in 06/2018  Hypokalemia: -hx of -takes potassium as directed -did not tol acei/arb   Depression/underweight/neuropathy/pain: -long hx neuropathy, sees neurology -had longstanding pain after vascular surgery -cymbalta helped all of the above -takes b12 for neuropathy, b12 def  ?pap smear: not due for repeat pap until 10/22. (08/2016 pap was normal/negative)  Mammogram: last 07/2018  Past Medical History:  Diagnosis Date  . Allergy   . Alopecia   . Anal cancer ([place]) 08/14/13   invasive squamous cell ca, s/p radiation 10/20-11/26/14 60.4Gy/40fx and chemo  . Anxiety   . 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 B12 deficiency anemia 09/14/2015  . Blood transfusion without reported diagnosis   . BPPV (benign paroxysmal positional vertigo) 07/08/2015  . Cardiomyopathy ([place])  11/03/2017  . Chronic back pain   . Chronic daily headache 03/29/2013   takes bc powder  . Closed right hip fracture ([place]) 09/10/2015  . Coronary artery disease involving native coronary artery of native heart without angina pectoris 10/13/2017   DES to mid RCA  . Depression   . Essential (hemorrhagic) thrombocythemia ([place]) 09/06/2018  . Family history of early CAD 04/08/2016  . GERD (gastroesophageal reflux disease)   . History of hiatal hernia   . Hot flashes   . Hyperlipidemia associated with type 2 diabetes mellitus ([place]) 05/11/2017  . Hypothyroidism 09/10/2015  . IBS (irritable bowel syndrome) 03/29/2013  . Neck pain 07/08/2015  . 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 Peripheral vascular disease ([place]) 11/03/2017  . QT prolongation   . Sacroiliitis ([place]) 09/06/2018  . Spasms of the hands or feet 10/08/2017  . Syncope 06/07/2016  . Tubular adenoma of colon 09/08/2003  . Vertigo   . Wears glasses    Past Surgical History:  Procedure Laterality Date  . ABDOMINAL AORTOGRAM N/A 08/02/2017   Procedure: ABDOMINAL AORTOGRAM;  Surgeon: Wellington Hampshire, MD;  Location: [facility];  Service: Cardiovascular;  Laterality: N/A;  . AORTA - BILATERAL FEMORAL ARTERY BYPASS GRAFT N/A 08/14/2017   Procedure: AORTA BIFEMORAL BYPASS GRAFT;  Surgeon: Rosetta Posner, MD;  Location: [facility];  Service: Vascular;  Laterality: N/A;  . COLONOSCOPY    . CORONARY STENT INTERVENTION N/A 10/13/2017   Procedure: CORONARY STENT INTERVENTION;  Surgeon: Nelva Bush, MD;  Location: [facility];  Service: Cardiovascular;  Laterality: N/A;  . dental implant    . ECTOPIC PREGNANCY SURGERY    . EMBOLECTOMY N/A 08/14/2017  Procedure: EMBOLECTOMY FEMORAL;  Surgeon: Rosetta Posner, MD;  Location: [facility];  Service: Vascular;  Laterality: N/A;  . FEMORAL-POPLITEAL BYPASS GRAFT Right 08/14/2017   Procedure: Right Femoral to Above Knee Popliteal Bypass Graft using Non-Reversed Greater Saphenous Vein Graft  from Right Leg;  Surgeon: Rosetta Posner, MD;  Location: [facility];  Service: Vascular;  Laterality: Right;  . FLEXIBLE SIGMOIDOSCOPY N/A 08/14/2013   Procedure: FLEXIBLE SIGMOIDOSCOPY;  Surgeon: Ladene Artist, MD;  Location: [facility];  Service: Endoscopy;  Laterality: N/A;  . LEFT HEART CATH AND CORONARY ANGIOGRAPHY N/A 10/13/2017   Procedure: LEFT HEART CATH AND CORONARY ANGIOGRAPHY;  Surgeon: Nelva Bush, MD;  Location: [facility];  Service: Cardiovascular;  Laterality: N/A;  . LOWER EXTREMITY ANGIOGRAPHY Bilateral 08/02/2017   Procedure: Lower Extremity Angiography;  Surgeon: Wellington Hampshire, MD;  Location: [facility];  Service: Cardiovascular;  Laterality: Bilateral;  . MULTIPLE TOOTH EXTRACTIONS    . PERIPHERAL VASCULAR INTERVENTION  05/22/2018   Procedure: PERIPHERAL VASCULAR INTERVENTION;  Surgeon: Serafina Mitchell, MD;  Location: [facility];  Service: Cardiovascular;;  SMA and Celiac  . PILONIDAL CYST EXCISION    . POLYPECTOMY    . THROMBECTOMY FEMORAL ARTERY Right 08/14/2017   Procedure: THROMBECTOMY FEMORAL ARTERY;  Surgeon: Rosetta Posner, MD;  Location: [facility];  Service: Vascular;  Laterality: Right;  . TONSILLECTOMY    . TOTAL HIP ARTHROPLASTY  09/11/2015   Procedure: TOTAL HIP ARTHROPLASTY;  Surgeon: Renette Butters, MD;  Location: [facility];  Service: Orthopedics;;  . ULTRASOUND GUIDANCE FOR VASCULAR ACCESS  10/13/2017   Procedure: Ultrasound Guidance For Vascular Access;  Surgeon: Nelva Bush, MD;  Location: [facility];  Service: Cardiovascular;;   Allergies  Allergen Reactions  . Amitiza [Lubiprostone] Diarrhea    Uncontrollable diarrhea  . Nortriptyline Other (See Comments)    Stomach distention  . Sulfa Antibiotics Nausea And Vomiting and Other (See Comments)    GI distress/pain  . Tramadol Itching and Rash  . Atorvastatin Nausea And Vomiting  . Claritin [Loratadine] Other (See Comments)    Hot flashes  . Dexamethasone Itching  .  Prednisone Rash   No outpatient medications have been marked as taking for the 02/12/20 encounter (Office Visit) with [name], MD.   Social History   Tobacco Use  . Smoking status: Former Smoker    Packs/day: 0.50    Types: Cigarettes    Quit date: 10/07/2014    Years since quitting: 5.3  . Smokeless tobacco: Never Used  Substance Use Topics  . Alcohol use: No    Alcohol/week: 0.0 standard drinks   Family History  Problem Relation Age of Onset  . Arthritis Mother   . Hyperlipidemia Mother   . Heart disease Mother   . Hypertension Mother   . Stroke Mother 35  . Irritable bowel syndrome Mother   . Thyroid disease Mother   . Heart attack Mother   . Heart disease Father   . Hyperlipidemia Father   . Hypertension Father   . Stroke Father 44  . Thyroid disease Father   . Prostate cancer Father   . Heart attack Father   . Lung cancer Brother 73  . Heart attack Maternal Grandfather   . Heart attack Maternal Uncle   . Colon cancer Neg Hx   . Rectal cancer Neg Hx   . Stomach cancer Neg Hx   . Esophageal cancer Neg Hx  Review of Systems  Constitutional: Negative for activity change, appetite change, chills, fatigue, fever and unexpected weight change.  HENT: Negative for congestion, ear pain, hearing loss, sinus pressure, sinus pain, sore throat and trouble swallowing.   Eyes: Negative for pain and visual disturbance.  Respiratory: Negative for cough, chest tightness, shortness of breath and wheezing.   Cardiovascular: Negative for chest pain, palpitations and leg swelling.  Gastrointestinal: Negative for abdominal pain, blood in stool, constipation, diarrhea, nausea and vomiting.  Genitourinary: Negative for difficulty urinating and menstrual problem.  Musculoskeletal: Negative for arthralgias and back pain.  Skin: Negative for rash.  Neurological: Negative for dizziness, weakness, numbness and headaches.  Hematological: Negative for adenopathy. Does not  bruise/bleed easily.  Psychiatric/Behavioral: Negative for sleep disturbance and suicidal ideas. The patient is not nervous/anxious.     CBC:  Lab Results  Component Value Date   WBC 6.7 11/08/2019   WBC 6.7 01/03/2019   HGB 11.9 11/08/2019   HGB 10.6 (L) 11/28/2013   HCT 35.7 11/08/2019   HCT 31.2 (L) 11/28/2013   MCH 33.1 (H) 11/08/2019   MCH 29.8 10/14/2017   MCHC 33.3 11/08/2019   MCHC 32.9 01/03/2019   RDW 11.8 11/08/2019   RDW 15.4 (H) 11/28/2013   PLT 250 11/08/2019   CMP: Lab Results  Component Value Date   NA 135 11/08/2019   NA 139 04/22/2014   K 4.3 11/08/2019   K 4.1 04/22/2014   CL 99 11/08/2019   CO2 23 11/08/2019   CO2 23 04/22/2014   ANIONGAP 7 10/14/2017   GLUCOSE 80 11/08/2019   GLUCOSE 78 11/29/2018   GLUCOSE 81 04/22/2014   BUN 18 11/08/2019   BUN 8.8 04/22/2014   CREATININE 0.71 11/08/2019   CREATININE 0.8 04/22/2014   LABGLOB 2.4 05/01/2019   GFRAA 105 11/08/2019   CALCIUM 8.8 11/08/2019   CALCIUM 9.1 04/22/2014   PROT 6.9 05/01/2019   PROT 6.2 (L) 11/28/2013   AGRATIO 1.9 05/01/2019   BILITOT <0.2 05/01/2019   BILITOT 0.27 11/28/2013   ALKPHOS 86 05/01/2019   ALKPHOS 80 11/28/2013   ALT 10 05/01/2019   ALT 9 11/28/2013   AST 19 05/01/2019   AST 11 11/28/2013   LIPID: Lab Results  Component Value Date   CHOL 163 05/01/2019   TRIG 242 (H) 05/01/2019   HDL 38 (L) 05/01/2019   LDLCALC 77 05/01/2019   LABVLDL 48 (H) 05/01/2019    Objective:  BP 98/60 (BP Location: Left Arm, Patient Position: Sitting, Cuff Size: Normal)   Pulse (!) 59   Temp 97.8 F (36.6 C) (Temporal)   Ht 5\' 4"  (1.626 m)   Wt 103 lb 9.6 oz (47 kg)   SpO2 97%   BMI 17.78 kg/m   Weight: 103 lb 9.6 oz (47 kg)   BP Readings from Last 3 Encounters:  02/12/20 98/60  11/05/19 117/65  08/06/19 118/70   Wt Readings from Last 3 Encounters:  02/12/20 103 lb 9.6 oz (47 kg)  12/16/19 100 lb (45.4 kg)  11/05/19 100 lb (45.4 kg)    Physical  Exam Constitutional:      General: She is not in acute distress.    Appearance: She is well-developed.  HENT:     Head: Normocephalic and atraumatic.     Right Ear: External ear normal.     Left Ear: External ear normal.     Mouth/Throat:     Pharynx: No oropharyngeal exudate.  Eyes:     Conjunctiva/sclera: Conjunctivae normal.  Pupils: Pupils are equal, round, and reactive to light.  Neck:     Thyroid: No thyromegaly.  Cardiovascular:     Rate and Rhythm: Normal rate and regular rhythm.     Heart sounds: Normal heart sounds. No murmur. No friction rub. No gallop.   Pulmonary:     Effort: Pulmonary effort is normal.     Breath sounds: Normal breath sounds.  Abdominal:     General: Bowel sounds are normal. There is no distension.     Palpations: Abdomen is soft. There is no mass.     Tenderness: There is no abdominal tenderness. There is no guarding.     Hernia: No hernia is present.  Musculoskeletal:        General: No tenderness or deformity. Normal range of motion.     Cervical back: Normal range of motion and neck supple.  Lymphadenopathy:     Cervical: No cervical adenopathy.  Skin:    General: Skin is warm and dry.     Findings: No rash.  Neurological:     Mental Status: She is alert and oriented to person, place, and time.     Deep Tendon Reflexes: Reflexes normal.     Reflex Scores:      Tricep reflexes are 2+ on the right side and 2+ on the left side.      Bicep reflexes are 2+ on the right side and 2+ on the left side.      Brachioradialis reflexes are 2+ on the right side and 2+ on the left side.      Patellar reflexes are 2+ on the right side and 2+ on the left side. Psychiatric:        Speech: Speech normal.        Behavior: Behavior normal.        Thought Content: Thought content normal.     Assessment/Plan: Health Maintenance Due  Topic Date Due  . FOOT EXAM  08/17/2019  . PAP SMEAR-Modifier  09/13/2019  . OPHTHALMOLOGY EXAM  01/20/2020    Health Maintenance reviewed - mammogram ordered, patient to schedule appointment. 1. Preventative health care Encouraged her to consider regular exercise.  2. Encounter for screening mammogram for malignant neoplasm of breast - [facility] DIGITAL SCREENING BILATERAL; Future  3. Hypothyroidism, unspecified type Recheck blood work. She has been taking Synthroid as directed. - TSH  4. Elevated glucose We will continue to monitor glucose. Has been well controlled.  5. Hypokalemia Has done well ever since starting potassium supplement. Has been hospitalized multiple times for hypokalemia in the past. - Comprehensive metabolic panel; Future - Comprehensive metabolic panel  6. GERD without esophagitis Well-controlled on omeprazole.  7. Coronary artery disease involving native coronary artery of native heart without angina pectoris Follows regularly with cardiology. On Xarelto.  8. Hyperlipidemia, unspecified hyperlipidemia type Well-controlled on Crestor 40 mg daily.  9. B12 deficiency - CBC with Differential/Platelet - Vitamin B12   Return in about 6 months (around 08/14/2020) for Chronic condition visit.  Micheline Rough, MD

## 2020-02-13 ENCOUNTER — Telehealth: Payer: Self-pay | Admitting: Family Medicine

## 2020-02-13 NOTE — Telephone Encounter (Signed)
Pt returning call regarding lab results. Thanks °

## 2020-02-14 NOTE — Telephone Encounter (Signed)
See results note. 

## 2020-02-17 ENCOUNTER — Telehealth: Payer: Self-pay | Admitting: Family Medicine

## 2020-02-17 NOTE — Addendum Note (Signed)
Addended by: Agnes Lawrence on: 02/17/2020 08:29 AM   Modules accepted: Orders

## 2020-02-17 NOTE — Telephone Encounter (Signed)
Spoke with the pt and informed her of the results. 

## 2020-02-17 NOTE — Telephone Encounter (Signed)
Pt is requesting to speak to you regarding earlier's phone call. Per pt, she forgot the instructions that she was given and would like a call back. Thanks

## 2020-02-18 NOTE — Progress Notes (Signed)
Cardiology Office Note:    Date:  02/19/2020   ID:  Erin [name], DOB 10-13-1956, MRN [id number]  PCP:  [name], MD  Cardiologist:  [name], MD / Richardson Dopp, PA-C  Electrophysiologist:  None   Referring MD: [name], MD   Chief Complaint:  Follow-up (CAD)    Patient Profile:    Erin [name] is a 64 y.o. female with:   Coronary artery disease ? S/p DES to mid RCA 09/2017 ? Managed with ASA + rivaroxaban 2.5 twice daily (COMPASS trial)  Heart failure with reduced ejection fraction with recovered LVF ? Mixed ischemic/nonischemic cardiomyopathy ? Echo 3/19: EF 55-60 ? Echo 12/18: EF 35-40  Peripheral arterial disease ? S/p aortobifem and bilateral femoral to popliteal bypasses 07/2017 ? Chronic mesenteric ischemia  Hypokalemia  Anal CA s/p XRT 2014  Diabetes mellitus  Hyperlipidemia  Prior CV studies: Mesenteric artery [place] 01/03/2019 Summary: Mesenteric: 70 to 99% stenosis in the superior mesenteric artery and celiac artery. Elevated velocities seen in SMA and Celiac stents are higher than previously Recorded.  Echo 01/24/18 EF 55-60, no RWMA, normal diastolic function, trivial MR/TR  Echo 10/23/2017 EF 35-40  Cardiac catheterization 10/13/2017 LAD mid 40 LCx normal RCA proximal 30, mid 99, 30, distal 50 EF 40-45 PCI: 2 x 12 mm Resolute Onyx DES to the mid RCA  Nuclear stress test 08/09/2017 EF 44, normal perfusion, low risk  Echo 06/08/2016 EF 55-60, normal wall motion, grade 1 diastolic dysfunction  History of Present Illness:    [name] was previously followed by Dr. Saunders Revel.  She now sees me along with Dr. Burt Knack as needed.  She was last seen via Telemedicine in 07/2019.  She returns for follow-up.  She is here alone.  Since last seen, she has done well.  She is not really had any further palpitations in the past month.  Question of these may have been related to PVCs.  I did advise her to contact us if she  has recurrent symptoms and we could pursue an event monitor.  She has not had chest discomfort or significant shortness of breath.  She does have intermittent claudication that is overall stable.  Recently, she has started having abdominal discomfort after eating.  She has not had syncope.  Past Medical History:  Diagnosis Date  . Allergy   . Alopecia   . Anal cancer ([place]) 08/14/13   invasive squamous cell ca, s/p radiation 10/20-11/26/14 60.4Gy/26fx and chemo  . Anxiety   . Aortoiliac occlusive disease ([place]) 08/14/2017  . B12 deficiency anemia 09/14/2015  . Blood transfusion without reported diagnosis   . BPPV (benign paroxysmal positional vertigo) 07/08/2015  . Cardiomyopathy ([place]) 11/03/2017  . Chronic back pain   . Chronic daily headache 03/29/2013   takes bc powder  . Closed right hip fracture ([place]) 09/10/2015  . Coronary artery disease involving native coronary artery of native heart without angina pectoris 10/13/2017   DES to mid RCA  . Depression   . Essential (hemorrhagic) thrombocythemia ([place]) 09/06/2018  . Family history of early CAD 04/08/2016  . GERD (gastroesophageal reflux disease)   . History of hiatal hernia   . Hot flashes   . Hyperlipidemia associated with type 2 diabetes mellitus ([place]) 05/11/2017  . Hypothyroidism 09/10/2015  . IBS (irritable bowel syndrome) 03/29/2013  . Neck pain 07/08/2015  . 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 Peripheral vascular disease ([place]) 11/03/2017  . QT prolongation   . Sacroiliitis ([place])  09/06/2018  . Spasms of the hands or feet 10/08/2017  . Syncope 06/07/2016  . Tubular adenoma of colon 09/08/2003  . Vertigo   . Wears glasses     Current Medications: Current Meds  Medication Sig  . acetaminophen (TYLENOL) 500 MG tablet Take 1,000 mg by mouth daily as needed for headache (pain).   Marland Kitchen acyclovir ointment (ZOVIRAX) 5 % Apply 1 application topically every 3 (three) hours as needed (fever blisters).  Marland Kitchen aspirin EC 81 MG tablet  Take 81 mg by mouth daily.  . Biotin (BIOTIN 5000) 5 MG CAPS Take 5,000 mg by mouth daily.  . Cholecalciferol (VITAMIN D) 2000 units tablet Take 2,000 Units by mouth daily.  Marland Kitchen dicyclomine (BENTYL) 10 MG capsule TAKE 1 CAPSULE BY MOUTH 3 TIMES A DAY BEFORE MEALS  . DULoxetine (CYMBALTA) 20 MG capsule TAKE 2 CAPSULES BY MOUTH EVERY DAY  . fluticasone (FLONASE) 50 MCG/ACT nasal spray Place 1 spray into both nostrils daily.  Marland Kitchen gabapentin (NEURONTIN) 600 MG tablet TAKE 1 TABLET BY MOUTH THREE TIMES DAILY GENERIC EQUIVALENT FOR NEURONTIN  . KLOR-CON M20 20 MEQ tablet TAKE 1 TABLET BY MOUTH EVERY DAY  . levothyroxine (SYNTHROID) 50 MCG tablet 1/2 tab (85mcg) 4 days per week, 1 tab (99mcg) 3 days per week  . linaclotide (LINZESS) 72 MCG capsule Take 72 mcg by mouth as needed (bowel movement).  . meclizine (ANTIVERT) 12.5 MG tablet Take 1 tablet (12.5 mg total) by mouth 3 (three) times daily as needed for dizziness.  . metoprolol succinate (TOPROL-XL) 25 MG 24 hr tablet TAKE 1 TABLET BY MOUTH TWICE A DAY  . nitroGLYCERIN (NITROSTAT) 0.4 MG SL tablet Place 1 tablet (0.4 mg total) under the tongue every 5 (five) minutes as needed for chest pain.  Marland Kitchen nystatin-triamcinolone (MYCOLOG II) cream APPLY TO CORNERS OF THE MOUTH TWICE A DAY AS NEEDED FOR CRACKING/BURNING  . omeprazole (PRILOSEC) 40 MG capsule TAKE 1 CAPSULE BY MOUTH EVERY DAY  . rosuvastatin (CRESTOR) 40 MG tablet TAKE 1 TABLET BY MOUTH EVERY DAY  . vitamin B-12 1000 MCG tablet Take 1 tablet (1,000 mcg total) by mouth daily.  Alveda Reasons 2.5 MG TABS tablet TAKE 1 TABLET BY MOUTH TWICE A DAY     Allergies:   Amitiza [lubiprostone], Nortriptyline, Sulfa antibiotics, Tramadol, Atorvastatin, Claritin [loratadine], Dexamethasone, and Prednisone   Social History   Tobacco Use  . Smoking status: Former Smoker    Packs/day: 0.50    Types: Cigarettes    Quit date: 10/07/2014    Years since quitting: 5.3  . Smokeless tobacco: Never Used  Substance Use  Topics  . Alcohol use: No    Alcohol/week: 0.0 standard drinks  . Drug use: No     Family Hx: The patient's family history includes Arthritis in her mother; Heart attack in her father, maternal grandfather, maternal uncle, and mother; Heart disease in her father and mother; Hyperlipidemia in her father and mother; Hypertension in her father and mother; Irritable bowel syndrome in her mother; Lung cancer (age of onset: 48) in her brother; Prostate cancer in her father; Stroke (age of onset: 87) in her mother; Stroke (age of onset: 11) in her father; Thyroid disease in her father and mother. There is no history of Colon cancer, Rectal cancer, Stomach cancer, or Esophageal cancer.  ROS   EKGs/Labs/Other Test Reviewed:    EKG:  EKG is   ordered today.  The ekg ordered today demonstrates sinus bradycardia, HR 57, normal axis, nonspecific ST-T  wave changes, QTC 418, no change from prior tracing  Recent Labs: 02/12/2020: ALT 6; BUN 10; Creatinine, Ser 0.80; Hemoglobin 12.7; Platelets 281.0; Potassium 4.3; Sodium 135; TSH 7.16   Recent Lipid Panel Lab Results  Component Value Date/Time   CHOL 163 05/01/2019 02:05 PM   TRIG 242 (H) 05/01/2019 02:05 PM   HDL 38 (L) 05/01/2019 02:05 PM   CHOLHDL 4.3 05/01/2019 02:05 PM   CHOLHDL 9 09/26/2016 11:50 AM   LDLCALC 77 05/01/2019 02:05 PM   LDLDIRECT 214.0 09/26/2016 11:50 AM    Physical Exam:    VS:  BP (!) 100/50   Pulse (!) 57   Ht 5\' 3"  (1.6 m)   Wt 102 lb 12.8 oz (46.6 kg)   SpO2 94%   BMI 18.21 kg/m     Wt Readings from Last 3 Encounters:  02/19/20 102 lb 12.8 oz (46.6 kg)  02/12/20 103 lb 9.6 oz (47 kg)  12/16/19 100 lb (45.4 kg)     Constitutional:      Appearance: Healthy appearance. Not in distress.  Neck:     Thyroid: No thyromegaly.     Vascular: JVD normal.  Pulmonary:     Effort: Pulmonary effort is normal.     Breath sounds: No wheezing. No rales.  Cardiovascular:     Normal rate. Regular rhythm. Normal S1. Normal  S2.     Murmurs: There is no murmur.  Edema:    Peripheral edema absent.  Abdominal:     Palpations: Abdomen is soft. There is no hepatomegaly.     Tenderness: There is no abdominal tenderness.  Skin:    General: Skin is warm and dry.  Neurological:     General: No focal deficit present.     Mental Status: Alert and oriented to person, place and time.       ASSESSMENT & PLAN:    1. Coronary artery disease involving native coronary artery of native heart without angina pectoris History of drug-eluting stent to the mid RCA in November 2018.  She has maintained on aspirin in addition to low-dose Rivaroxaban.  She is doing well without anginal symptoms.  Recent hemoglobin, creatinine stable.  Continue aspirin, metoprolol succinate, rosuvastatin.  Follow-up in 1 year.  2. Hyperlipidemia LDL goal <70 LDL in June 2020 was 77.  Recent ALT was normal.  Repeat fasting lipid panel was obtained earlier today.  If her LDL remains above 70, consider adding ezetimibe to get her LDL to goal.  3. Chronic mesenteric ischemia ([place]) She has a history of SMA and celiac artery stenting.  She has recently noted abdominal pain with eating.  I have recommended that she follow-up with vascular surgery.   4.  Palpitations These may have been PVCs versus PACs.  They seem to have resolved.  I have recommended that she contact us if they should recur so that we can arrange an event monitor.   Dispo:  Return in about 1 year (around 02/18/2021) for Routine Follow Up, w/ Richardson Dopp, PA-C, in person.   Medication Adjustments/Labs and Tests Ordered: Current medicines are reviewed at length with the patient today.  Concerns regarding medicines are outlined above.  Tests Ordered: Orders Placed This Encounter  Procedures  . EKG 12-Lead   Medication Changes: No orders of the defined types were placed in this encounter.   Signed, Richardson Dopp, PA-C  02/19/2020 1:12 PM    [facility]  [address], [place], [place]  [address] Phone: [phone]; Fax: [phone]

## 2020-02-19 ENCOUNTER — Other Ambulatory Visit: Payer: Self-pay

## 2020-02-19 ENCOUNTER — Other Ambulatory Visit: Payer: 59 | Admitting: *Deleted

## 2020-02-19 ENCOUNTER — Encounter: Payer: Self-pay | Admitting: Physician Assistant

## 2020-02-19 ENCOUNTER — Ambulatory Visit (INDEPENDENT_AMBULATORY_CARE_PROVIDER_SITE_OTHER): Payer: 59 | Admitting: Physician Assistant

## 2020-02-19 VITALS — BP 100/50 | HR 57 | Ht 63.0 in | Wt 102.8 lb

## 2020-02-19 DIAGNOSIS — K551 Chronic vascular disorders of intestine: Secondary | ICD-10-CM

## 2020-02-19 DIAGNOSIS — I739 Peripheral vascular disease, unspecified: Secondary | ICD-10-CM

## 2020-02-19 DIAGNOSIS — R002 Palpitations: Secondary | ICD-10-CM

## 2020-02-19 DIAGNOSIS — I2510 Atherosclerotic heart disease of native coronary artery without angina pectoris: Secondary | ICD-10-CM

## 2020-02-19 DIAGNOSIS — E785 Hyperlipidemia, unspecified: Secondary | ICD-10-CM | POA: Diagnosis not present

## 2020-02-19 NOTE — Patient Instructions (Signed)
Medication Instructions:  Your physician recommends that you continue on your current medications as directed. Please refer to the Current Medication list given to you today.  *If you need a refill on your cardiac medications before your next appointment, please call your pharmacy*  Lab Work: None ordered today  Testing/Procedures: None ordered today  Follow-Up: At [facility], you and your health needs are our priority.  As part of our continuing mission to provide you with exceptional heart care, we have created designated Provider Care Teams.  These Care Teams include your primary Cardiologist (physician) and Advanced Practice Providers (APPs -  Physician Assistants and Nurse Practitioners) who all work together to provide you with the care you need, when you need it.  We recommend signing up for the patient portal called "MyChart".  Sign up information is provided on this After Visit Summary.  MyChart is used to connect with patients for Virtual Visits (Telemedicine).  Patients are able to view lab/test results, encounter notes, upcoming appointments, etc.  Non-urgent messages can be sent to your provider as well.   To learn more about what you can do with MyChart, go to [email or web address].    Your next appointment:   12 month(s)  The format for your next appointment:   In Person  Provider:   Scott Weaver, PA-C   

## 2020-02-20 LAB — LIPID PANEL
Chol/HDL Ratio: 10.5 ratio — ABNORMAL HIGH (ref 0.0–4.4)
Cholesterol, Total: 304 mg/dL — ABNORMAL HIGH (ref 100–199)
HDL: 29 mg/dL — ABNORMAL LOW (ref >39)
LDL Chol Calc (NIH): 200 mg/dL — ABNORMAL HIGH (ref 0–99)
Triglycerides: 368 mg/dL — ABNORMAL HIGH (ref 0–149)
VLDL Cholesterol Cal: 75 mg/dL — ABNORMAL HIGH (ref 5–40)

## 2020-02-21 ENCOUNTER — Other Ambulatory Visit: Payer: Self-pay

## 2020-02-21 DIAGNOSIS — I2510 Atherosclerotic heart disease of native coronary artery without angina pectoris: Secondary | ICD-10-CM

## 2020-02-21 DIAGNOSIS — E785 Hyperlipidemia, unspecified: Secondary | ICD-10-CM

## 2020-02-21 MED ORDER — ROSUVASTATIN CALCIUM 40 MG PO TABS: 40.0000 mg | ORAL_TABLET | Freq: Every day | ORAL | 3 refills | Status: DC

## 2020-02-24 ENCOUNTER — Telehealth: Payer: Self-pay

## 2020-02-24 NOTE — Telephone Encounter (Signed)
Pt called with c/o stomach pain that she feels may be related to her stents that were placed in 2020. Per Dr. Trula Slade, she needs to be added on for complete mesenteric duplex tomorrow and she will see Dr. Donnetta Hutching afterwards. Pt is aware she is being added on for this appt. And that she must be NPO after midnight tonight.

## 2020-02-25 ENCOUNTER — Other Ambulatory Visit: Payer: Self-pay

## 2020-02-25 ENCOUNTER — Ambulatory Visit (HOSPITAL_COMMUNITY)
Admission: RE | Admit: 2020-02-25 | Discharge: 2020-02-25 | Disposition: A | Discharge status: Home / Self Care | Payer: 59 | Source: Ambulatory Visit | Attending: Surgery | Admitting: Surgery

## 2020-02-25 ENCOUNTER — Ambulatory Visit (INDEPENDENT_AMBULATORY_CARE_PROVIDER_SITE_OTHER): Payer: 59 | Admitting: Vascular Surgery

## 2020-02-25 ENCOUNTER — Encounter: Payer: Self-pay | Admitting: Vascular Surgery

## 2020-02-25 VITALS — BP 124/68 | HR 67 | Temp 97.3°F | Resp 20 | Ht 63.0 in | Wt 102.0 lb

## 2020-02-25 DIAGNOSIS — K551 Chronic vascular disorders of intestine: Secondary | ICD-10-CM

## 2020-02-25 NOTE — Progress Notes (Signed)
[facility] [place]  Patient name: Erin [name] MRN: [id number] DOB: 01/02/1956 Sex: female  REASON FOR VISIT: Evaluations of symptoms of mesenteric ischemia  HPI: Erin [name] is a 64 y.o. female here today for evaluation.  She is well-known to me from prior aortobifemoral bypass for critical ischemia in September 2018 and right femoral to above-knee popliteal bypass with saphenous vein.  She then presented a year later with symptoms of severe mesenteric ischemia.  She had postprandial pain and had lost down to 85 pounds.  She underwent mesenteric arteriography and angioplasty and stenting of her superior mesenteric and celiac arteries on 05/22/2018 with Dr. Trula Slade.  She had immediate relief of her symptoms and weight gain.  She was seen in routine follow-up on 01/03/2019.  She was having no symptoms.  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  She was asymptomatic and no further treatment was recommended.  She presents now with a several week history of postprandial pain.  She reports that she did have discomfort yesterday even without eating.  She does not not know of any recent weight loss.  Past Medical History:  Diagnosis Date  . Allergy   . Alopecia   . Anal cancer ([place]) 08/14/13   invasive squamous cell ca, s/p radiation 10/20-11/26/14 60.4Gy/58fx and chemo  . Anxiety   . Aortoiliac occlusive disease ([place]) 08/14/2017  . B12 deficiency anemia 09/14/2015  . Blood transfusion without reported diagnosis   . BPPV (benign paroxysmal positional vertigo) 07/08/2015  . Cardiomyopathy ([place]) 11/03/2017  . Chronic back pain   . Chronic daily headache 03/29/2013   takes bc powder  . Closed right hip fracture ([place]) 09/10/2015  . Coronary artery disease involving native coronary artery of native heart without angina pectoris 10/13/2017   DES to mid RCA  . Depression     . Essential (hemorrhagic) thrombocythemia ([place]) 09/06/2018  . Family history of [place] CAD 04/08/2016  . GERD (gastroesophageal reflux disease)   . History of hiatal hernia   . Hot flashes   . Hyperlipidemia   . Hyperlipidemia associated with type 2 diabetes mellitus ([place]) 05/11/2017  . Hypothyroidism 09/10/2015  . IBS (irritable bowel syndrome) 03/29/2013  . Neck pain 07/08/2015  . 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 Peripheral vascular disease ([place]) 11/03/2017  . QT prolongation   . Sacroiliitis ([place]) 09/06/2018  . Spasms of the hands or feet 10/08/2017  . Syncope 06/07/2016  . Tubular adenoma of colon 09/08/2003  . Vertigo   . Wears glasses     Family History  Problem Relation Age of Onset  . Arthritis Mother   . Hyperlipidemia Mother   . Heart disease Mother   . Hypertension Mother   . Stroke Mother 11  . Irritable bowel syndrome Mother   . Thyroid disease Mother   . Heart attack Mother   . Heart disease Father   . Hyperlipidemia Father   . Hypertension Father   . Stroke Father 17  . Thyroid disease Father   . Prostate cancer Father   . Heart attack Father   . Lung cancer Brother 76  . Heart attack Maternal Grandfather   . Heart attack Maternal Uncle   . Colon cancer Neg Hx   . Rectal cancer Neg Hx   . Stomach cancer Neg Hx   . Esophageal cancer Neg Hx     SOCIAL HISTORY: Social History  Tobacco Use  . Smoking status: Former Smoker    Packs/day: 0.50    Types: Cigarettes    Quit date: 10/07/2014    Years since quitting: 5.3  . Smokeless tobacco: Never Used  Substance Use Topics  . Alcohol use: No    Alcohol/week: 0.0 standard drinks    Allergies  Allergen Reactions  . Amitiza [Lubiprostone] Diarrhea    Uncontrollable diarrhea  . Nortriptyline Other (See Comments)    Stomach distention  . Sulfa Antibiotics Nausea And Vomiting and Other (See Comments)    GI distress/pain  . Tramadol Itching and Rash  . Atorvastatin Nausea And Vomiting  .  Claritin [Loratadine] Other (See Comments)    Hot flashes  . Dexamethasone Itching  . Prednisone Rash    Current Outpatient Medications  Medication Sig Dispense Refill  . acetaminophen (TYLENOL) 500 MG tablet Take 1,000 mg by mouth daily as needed for headache (pain).     Marland Kitchen acyclovir ointment (ZOVIRAX) 5 % Apply 1 application topically every 3 (three) hours as needed (fever blisters). 15 g 3  . aspirin EC 81 MG tablet Take 81 mg by mouth daily.    . Biotin (BIOTIN 5000) 5 MG CAPS Take 5,000 mg by mouth daily.    . Cholecalciferol (VITAMIN D) 2000 units tablet Take 2,000 Units by mouth daily.    Marland Kitchen dicyclomine (BENTYL) 10 MG capsule TAKE 1 CAPSULE BY MOUTH 3 TIMES A DAY BEFORE MEALS 270 capsule 1  . DULoxetine (CYMBALTA) 20 MG capsule TAKE 2 CAPSULES BY MOUTH EVERY DAY 180 capsule 3  . fluticasone (FLONASE) 50 MCG/ACT nasal spray Place 1 spray into both nostrils daily. 48 g 1  . gabapentin (NEURONTIN) 600 MG tablet TAKE 1 TABLET BY MOUTH THREE TIMES DAILY GENERIC EQUIVALENT FOR NEURONTIN 270 tablet 1  . KLOR-CON M20 20 MEQ tablet TAKE 1 TABLET BY MOUTH EVERY DAY 90 tablet 1  . levothyroxine (SYNTHROID) 50 MCG tablet 1/2 tab (60mcg) 4 days per week, 1 tab (87mcg) 3 days per week 90 tablet 1  . linaclotide (LINZESS) 72 MCG capsule Take 72 mcg by mouth as needed (bowel movement).    . meclizine (ANTIVERT) 12.5 MG tablet Take 1 tablet (12.5 mg total) by mouth 3 (three) times daily as needed for dizziness. 30 tablet 5  . metoprolol succinate (TOPROL-XL) 25 MG 24 hr tablet TAKE 1 TABLET BY MOUTH TWICE A DAY 180 tablet 3  . nystatin-triamcinolone (MYCOLOG II) cream APPLY TO CORNERS OF THE MOUTH TWICE A DAY AS NEEDED FOR CRACKING/BURNING  2  . omeprazole (PRILOSEC) 40 MG capsule TAKE 1 CAPSULE BY MOUTH EVERY DAY 90 capsule 1  . rosuvastatin (CRESTOR) 40 MG tablet Take 1 tablet (40 mg total) by mouth daily. 90 tablet 3  . vitamin B-12 1000 MCG tablet Take 1 tablet (1,000 mcg total) by mouth daily. 30  tablet 0  . XARELTO 2.5 MG TABS tablet TAKE 1 TABLET BY MOUTH TWICE A DAY 180 tablet 1  . nitroGLYCERIN (NITROSTAT) 0.4 MG SL tablet Place 1 tablet (0.4 mg total) under the tongue every 5 (five) minutes as needed for chest pain. 100 tablet 1   No current facility-administered medications for this visit.    REVIEW OF SYSTEMS:  [X]  denotes positive finding, [ ]  denotes negative finding Cardiac  Comments:  Chest pain or chest pressure:     Shortness of breath upon exertion:    Short of breath when lying flat:    Irregular heart rhythm:  Vascular    Pain in calf, thigh, or hip brought on by ambulation:    Pain in feet at night that wakes you up from your sleep:     Blood clot in your veins:    Leg swelling:           PHYSICAL EXAM: Vitals:   02/25/20 1125  BP: 124/68  Pulse: 67  Resp: 20  Temp: (!) 97.3 F (36.3 C)  SpO2: 96%  Weight: 102 lb (46.3 kg)  Height: 5\' 3"  (1.6 m)    GENERAL: The patient is a well-nourished female, in no acute distress. The vital signs are documented above. CARDIOVASCULAR: Do not palpate right radial pulse.  She has a 2+ left radial pulse.  She has easily palpable femoral graft pulses bilaterally.  I do not hear any mesenteric abdominal bruit PULMONARY: There is good air exchange  MUSCULOSKELETAL: There are no major deformities or cyanosis. NEUROLOGIC: No focal weakness or paresthesias are detected. SKIN: There are no ulcers or rashes noted. PSYCHIATRIC: The patient has a normal affect.  DATA:  Duplex today shows flow in her SMA and celiac arteries with stenosis proximally  MEDICAL ISSUES: Recurrent stenosis in her SMA and celiac stents which were placed in July 2019.  She now has recurrent symptoms.  Have recommended repeat arteriography for further evaluation.  Also would recommend reintervention if indeed she does have significant recurrent stenosis.  This will be scheduled for 03/03/2020 with Dr. Gilmore Laroche, MD  [facility] [phone] Pager [phone]

## 2020-02-25 NOTE — H&P (View-Only) (Signed)
[facility] [place]  Patient name: Erin [name] MRN: [id number] DOB: October 15, 1956 Sex: female  REASON FOR VISIT: Evaluations of symptoms of mesenteric ischemia  HPI: Erin [name] is a 64 y.o. female here today for evaluation.  She is well-known to me from prior aortobifemoral bypass for critical ischemia in September 2018 and right femoral to above-knee popliteal bypass with saphenous vein.  She then presented a year later with symptoms of severe mesenteric ischemia.  She had postprandial pain and had lost down to 85 pounds.  She underwent mesenteric arteriography and angioplasty and stenting of her superior mesenteric and celiac arteries on 05/22/2018 with Dr. Trula Slade.  She had immediate relief of her symptoms and weight gain.  She was seen in routine follow-up on 01/03/2019.  She was having no symptoms.  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  She was asymptomatic and no further treatment was recommended.  She presents now with a several week history of postprandial pain.  She reports that she did have discomfort yesterday even without eating.  She does not not know of any recent weight loss.  Past Medical History:  Diagnosis Date  . Allergy   . Alopecia   . 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 Anxiety   . 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 B12 deficiency anemia 09/14/2015  . Blood transfusion without reported diagnosis   . BPPV (benign paroxysmal positional vertigo) 07/08/2015  . Cardiomyopathy ([place]) 11/03/2017  . Chronic back pain   . Chronic daily headache 03/29/2013   takes bc powder  . Closed right hip fracture ([place]) 09/10/2015  . Coronary artery disease involving native coronary artery of native heart without angina pectoris 10/13/2017   DES to mid RCA  . Depression     . Essential (hemorrhagic) thrombocythemia ([place]) 09/06/2018  . Family history of Ericberto Padget CAD 04/08/2016  . GERD (gastroesophageal reflux disease)   . History of hiatal hernia   . Hot flashes   . Hyperlipidemia   . Hyperlipidemia associated with type 2 diabetes mellitus ([place]) 05/11/2017  . Hypothyroidism 09/10/2015  . IBS (irritable bowel syndrome) 03/29/2013  . Neck pain 07/08/2015  . 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 Peripheral vascular disease ([place]) 11/03/2017  . QT prolongation   . Sacroiliitis ([place]) 09/06/2018  . Spasms of the hands or feet 10/08/2017  . Syncope 06/07/2016  . Tubular adenoma of colon 09/08/2003  . Vertigo   . Wears glasses     Family History  Problem Relation Age of Onset  . Arthritis Mother   . Hyperlipidemia Mother   . Heart disease Mother   . Hypertension Mother   . Stroke Mother 54  . Irritable bowel syndrome Mother   . Thyroid disease Mother   . Heart attack Mother   . Heart disease Father   . Hyperlipidemia Father   . Hypertension Father   . Stroke Father 39  . Thyroid disease Father   . Prostate cancer Father   . Heart attack Father   . Lung cancer Brother 89  . Heart attack Maternal Grandfather   . Heart attack Maternal Uncle   . Colon cancer Neg Hx   . Rectal cancer Neg Hx   . Stomach cancer Neg Hx   . Esophageal cancer Neg Hx     SOCIAL HISTORY: Social History  Tobacco Use  . Smoking status: Former Smoker    Packs/day: 0.50    Types: Cigarettes    Quit date: 10/07/2014    Years since quitting: 5.3  . Smokeless tobacco: Never Used  Substance Use Topics  . Alcohol use: No    Alcohol/week: 0.0 standard drinks    Allergies  Allergen Reactions  . Amitiza [Lubiprostone] Diarrhea    Uncontrollable diarrhea  . Nortriptyline Other (See Comments)    Stomach distention  . Sulfa Antibiotics Nausea And Vomiting and Other (See Comments)    GI distress/pain  . Tramadol Itching and Rash  . Atorvastatin Nausea And Vomiting  .  Claritin [Loratadine] Other (See Comments)    Hot flashes  . Dexamethasone Itching  . Prednisone Rash    Current Outpatient Medications  Medication Sig Dispense Refill  . acetaminophen (TYLENOL) 500 MG tablet Take 1,000 mg by mouth daily as needed for headache (pain).     Marland Kitchen acyclovir ointment (ZOVIRAX) 5 % Apply 1 application topically every 3 (three) hours as needed (fever blisters). 15 g 3  . aspirin EC 81 MG tablet Take 81 mg by mouth daily.    . Biotin (BIOTIN 5000) 5 MG CAPS Take 5,000 mg by mouth daily.    . Cholecalciferol (VITAMIN D) 2000 units tablet Take 2,000 Units by mouth daily.    Marland Kitchen dicyclomine (BENTYL) 10 MG capsule TAKE 1 CAPSULE BY MOUTH 3 TIMES A DAY BEFORE MEALS 270 capsule 1  . DULoxetine (CYMBALTA) 20 MG capsule TAKE 2 CAPSULES BY MOUTH EVERY DAY 180 capsule 3  . fluticasone (FLONASE) 50 MCG/ACT nasal spray Place 1 spray into both nostrils daily. 48 g 1  . gabapentin (NEURONTIN) 600 MG tablet TAKE 1 TABLET BY MOUTH THREE TIMES DAILY GENERIC EQUIVALENT FOR NEURONTIN 270 tablet 1  . KLOR-CON M20 20 MEQ tablet TAKE 1 TABLET BY MOUTH EVERY DAY 90 tablet 1  . levothyroxine (SYNTHROID) 50 MCG tablet 1/2 tab (80mcg) 4 days per week, 1 tab (32mcg) 3 days per week 90 tablet 1  . linaclotide (LINZESS) 72 MCG capsule Take 72 mcg by mouth as needed (bowel movement).    . meclizine (ANTIVERT) 12.5 MG tablet Take 1 tablet (12.5 mg total) by mouth 3 (three) times daily as needed for dizziness. 30 tablet 5  . metoprolol succinate (TOPROL-XL) 25 MG 24 hr tablet TAKE 1 TABLET BY MOUTH TWICE A DAY 180 tablet 3  . nystatin-triamcinolone (MYCOLOG II) cream APPLY TO CORNERS OF THE MOUTH TWICE A DAY AS NEEDED FOR CRACKING/BURNING  2  . omeprazole (PRILOSEC) 40 MG capsule TAKE 1 CAPSULE BY MOUTH EVERY DAY 90 capsule 1  . rosuvastatin (CRESTOR) 40 MG tablet Take 1 tablet (40 mg total) by mouth daily. 90 tablet 3  . vitamin B-12 1000 MCG tablet Take 1 tablet (1,000 mcg total) by mouth daily. 30  tablet 0  . XARELTO 2.5 MG TABS tablet TAKE 1 TABLET BY MOUTH TWICE A DAY 180 tablet 1  . nitroGLYCERIN (NITROSTAT) 0.4 MG SL tablet Place 1 tablet (0.4 mg total) under the tongue every 5 (five) minutes as needed for chest pain. 100 tablet 1   No current facility-administered medications for this visit.    REVIEW OF SYSTEMS:  [X]  denotes positive finding, [ ]  denotes negative finding Cardiac  Comments:  Chest pain or chest pressure:     Shortness of breath upon exertion:    Short of breath when lying flat:    Irregular heart rhythm:  Vascular    Pain in calf, thigh, or hip brought on by ambulation:    Pain in feet at night that wakes you up from your sleep:     Blood clot in your veins:    Leg swelling:           PHYSICAL EXAM: Vitals:   02/25/20 1125  BP: 124/68  Pulse: 67  Resp: 20  Temp: (!) 97.3 F (36.3 C)  SpO2: 96%  Weight: 102 lb (46.3 kg)  Height: 5\' 3"  (1.6 m)    GENERAL: The patient is a well-nourished female, in no acute distress. The vital signs are documented above. CARDIOVASCULAR: Do not palpate right radial pulse.  She has a 2+ left radial pulse.  She has easily palpable femoral graft pulses bilaterally.  I do not hear any mesenteric abdominal bruit PULMONARY: There is good air exchange  MUSCULOSKELETAL: There are no major deformities or cyanosis. NEUROLOGIC: No focal weakness or paresthesias are detected. SKIN: There are no ulcers or rashes noted. PSYCHIATRIC: The patient has a normal affect.  DATA:  Duplex today shows flow in her SMA and celiac arteries with stenosis proximally  MEDICAL ISSUES: Recurrent stenosis in her SMA and celiac stents which were placed in July 2019.  She now has recurrent symptoms.  Have recommended repeat arteriography for further evaluation.  Also would recommend reintervention if indeed she does have significant recurrent stenosis.  This will be scheduled for 03/03/2020 with Dr. Gilmore Laroche, MD  [facility] [phone] Pager [phone]

## 2020-03-02 ENCOUNTER — Other Ambulatory Visit (HOSPITAL_COMMUNITY)
Admission: RE | Admit: 2020-03-02 | Discharge: 2020-03-02 | Disposition: A | Discharge status: Home / Self Care | Payer: 59 | Source: Ambulatory Visit | Attending: Surgery | Admitting: Surgery

## 2020-03-02 ENCOUNTER — Other Ambulatory Visit: Payer: Self-pay

## 2020-03-02 DIAGNOSIS — Z01812 Encounter for preprocedural laboratory examination: Secondary | ICD-10-CM | POA: Diagnosis present

## 2020-03-02 DIAGNOSIS — Z20822 Contact with and (suspected) exposure to covid-19: Secondary | ICD-10-CM | POA: Diagnosis not present

## 2020-03-03 ENCOUNTER — Encounter (HOSPITAL_COMMUNITY)
Admission: RE | Disposition: A | Discharge status: Home / Self Care | Payer: Self-pay | Source: Home / Self Care | Attending: Surgery

## 2020-03-03 ENCOUNTER — Other Ambulatory Visit: Payer: Self-pay

## 2020-03-03 ENCOUNTER — Observation Stay (HOSPITAL_COMMUNITY)
Admission: RE | Admit: 2020-03-03 | Discharge: 2020-03-04 | Disposition: A | Discharge status: Home / Self Care | Payer: 59 | Attending: Surgery | Admitting: Surgery

## 2020-03-03 DIAGNOSIS — Z7901 Long term (current) use of anticoagulants: Secondary | ICD-10-CM | POA: Insufficient documentation

## 2020-03-03 DIAGNOSIS — E785 Hyperlipidemia, unspecified: Secondary | ICD-10-CM | POA: Diagnosis not present

## 2020-03-03 DIAGNOSIS — F329 Major depressive disorder, single episode, unspecified: Secondary | ICD-10-CM | POA: Diagnosis not present

## 2020-03-03 DIAGNOSIS — Z8249 Family history of ischemic heart disease and other diseases of the circulatory system: Secondary | ICD-10-CM | POA: Diagnosis not present

## 2020-03-03 DIAGNOSIS — I429 Cardiomyopathy, unspecified: Secondary | ICD-10-CM | POA: Insufficient documentation

## 2020-03-03 DIAGNOSIS — Z882 Allergy status to sulfonamides status: Secondary | ICD-10-CM | POA: Diagnosis not present

## 2020-03-03 DIAGNOSIS — Z888 Allergy status to other drugs, medicaments and biological substances status: Secondary | ICD-10-CM | POA: Diagnosis not present

## 2020-03-03 DIAGNOSIS — Z9221 Personal history of antineoplastic chemotherapy: Secondary | ICD-10-CM | POA: Insufficient documentation

## 2020-03-03 DIAGNOSIS — K551 Chronic vascular disorders of intestine: Principal | ICD-10-CM | POA: Diagnosis present

## 2020-03-03 DIAGNOSIS — Z923 Personal history of irradiation: Secondary | ICD-10-CM | POA: Diagnosis not present

## 2020-03-03 DIAGNOSIS — Z79899 Other long term (current) drug therapy: Secondary | ICD-10-CM | POA: Insufficient documentation

## 2020-03-03 DIAGNOSIS — I739 Peripheral vascular disease, unspecified: Secondary | ICD-10-CM | POA: Diagnosis present

## 2020-03-03 DIAGNOSIS — K219 Gastro-esophageal reflux disease without esophagitis: Secondary | ICD-10-CM | POA: Insufficient documentation

## 2020-03-03 DIAGNOSIS — I2510 Atherosclerotic heart disease of native coronary artery without angina pectoris: Secondary | ICD-10-CM | POA: Diagnosis not present

## 2020-03-03 DIAGNOSIS — F419 Anxiety disorder, unspecified: Secondary | ICD-10-CM | POA: Insufficient documentation

## 2020-03-03 DIAGNOSIS — K589 Irritable bowel syndrome without diarrhea: Secondary | ICD-10-CM | POA: Diagnosis not present

## 2020-03-03 DIAGNOSIS — E039 Hypothyroidism, unspecified: Secondary | ICD-10-CM | POA: Diagnosis not present

## 2020-03-03 DIAGNOSIS — Z7982 Long term (current) use of aspirin: Secondary | ICD-10-CM | POA: Insufficient documentation

## 2020-03-03 DIAGNOSIS — Z885 Allergy status to narcotic agent status: Secondary | ICD-10-CM | POA: Diagnosis not present

## 2020-03-03 DIAGNOSIS — Z87891 Personal history of nicotine dependence: Secondary | ICD-10-CM | POA: Insufficient documentation

## 2020-03-03 DIAGNOSIS — G63 Polyneuropathy in diseases classified elsewhere: Secondary | ICD-10-CM | POA: Diagnosis present

## 2020-03-03 DIAGNOSIS — Z85048 Personal history of other malignant neoplasm of rectum, rectosigmoid junction, and anus: Secondary | ICD-10-CM | POA: Diagnosis not present

## 2020-03-03 HISTORY — PX: VISCERAL ANGIOGRAPHY: CATH118276

## 2020-03-03 LAB — POCT I-STAT, CHEM 8
BUN: 13 mg/dL (ref 8–23)
Calcium, Ion: 1.19 mmol/L (ref 1.15–1.40)
Chloride: 108 mmol/L (ref 98–111)
Creatinine, Ser: 0.9 mg/dL (ref 0.44–1.00)
Glucose, Bld: 81 mg/dL (ref 70–99)
HCT: 37 % (ref 36.0–46.0)
Hemoglobin: 12.6 g/dL (ref 12.0–15.0)
Potassium: 3.9 mmol/L (ref 3.5–5.1)
Sodium: 141 mmol/L (ref 135–145)
TCO2: 26 mmol/L (ref 22–32)

## 2020-03-03 LAB — POCT ACTIVATED CLOTTING TIME
Activated Clotting Time: 213 seconds
Activated Clotting Time: 208 seconds
Activated Clotting Time: 180 seconds

## 2020-03-03 LAB — SARS CORONAVIRUS 2 (TAT 6-24 HRS): SARS Coronavirus 2: NEGATIVE

## 2020-03-03 SURGERY — VISCERAL ANGIOGRAPHY
Anesthesia: LOCAL

## 2020-03-03 MED ORDER — CLOPIDOGREL BISULFATE 75 MG PO TABS: 75.0000 mg | ORAL_TABLET | Freq: Every day | ORAL | 11 refills | Status: DC

## 2020-03-03 MED ORDER — HEPARIN (PORCINE) IN NACL 1000-0.9 UT/500ML-% IV SOLN
INTRAVENOUS | Status: AC
  Filled 2020-03-03: qty 500

## 2020-03-03 MED ORDER — HEPARIN SODIUM (PORCINE) 1000 UNIT/ML IJ SOLN
INTRAMUSCULAR | Status: DC | PRN
  Administered 2020-03-03: 1000 [IU] via INTRAVENOUS
  Administered 2020-03-03: 2000 [IU] via INTRAVENOUS
  Administered 2020-03-03: 4000 [IU] via INTRAVENOUS

## 2020-03-03 MED ORDER — NITROGLYCERIN 1 MG/10 ML FOR IR/CATH LAB
INTRA_ARTERIAL | Status: DC | PRN
  Administered 2020-03-03: 200 ug via INTRA_ARTERIAL

## 2020-03-03 MED ORDER — SODIUM CHLORIDE 0.9% FLUSH: 3.0000 mL | Freq: Two times a day (BID) | INTRAVENOUS | Status: DC

## 2020-03-03 MED ORDER — CLOPIDOGREL BISULFATE 75 MG PO TABS
75.0000 mg | ORAL_TABLET | Freq: Every day | ORAL | Status: DC
  Administered 2020-03-04: 75 mg via ORAL
  Filled 2020-03-03: qty 1

## 2020-03-03 MED ORDER — PHENOL 1.4 % MT LIQD: 1.0000 | OROMUCOSAL | Status: DC | PRN

## 2020-03-03 MED ORDER — ASPIRIN EC 81 MG PO TBEC
81.0000 mg | DELAYED_RELEASE_TABLET | Freq: Every day | ORAL | Status: DC
  Administered 2020-03-04: 81 mg via ORAL
  Filled 2020-03-03: qty 1

## 2020-03-03 MED ORDER — SODIUM CHLORIDE 0.9 % IV SOLN: 250.0000 mL | INTRAVENOUS | Status: DC | PRN

## 2020-03-03 MED ORDER — LEVOTHYROXINE SODIUM 50 MCG PO TABS
50.0000 ug | ORAL_TABLET | Freq: Every day | ORAL | Status: DC
  Administered 2020-03-04: 06:00:00 50 ug via ORAL
  Filled 2020-03-03: qty 1

## 2020-03-03 MED ORDER — METOPROLOL TARTRATE 5 MG/5ML IV SOLN: 2.0000 mg | INTRAVENOUS | Status: DC | PRN

## 2020-03-03 MED ORDER — GABAPENTIN 600 MG PO TABS
600.0000 mg | ORAL_TABLET | Freq: Three times a day (TID) | ORAL | Status: DC
  Administered 2020-03-03 – 2020-03-04 (×2): 600 mg via ORAL
  Filled 2020-03-03 (×2): qty 1

## 2020-03-03 MED ORDER — LIDOCAINE HCL (PF) 1 % IJ SOLN
INTRAMUSCULAR | Status: DC | PRN
  Administered 2020-03-03: 5 mL

## 2020-03-03 MED ORDER — ACETAMINOPHEN 325 MG PO TABS: 650.0000 mg | ORAL_TABLET | ORAL | Status: DC | PRN

## 2020-03-03 MED ORDER — NITROGLYCERIN 1 MG/10 ML FOR IR/CATH LAB
INTRA_ARTERIAL | Status: AC
  Filled 2020-03-03: qty 10

## 2020-03-03 MED ORDER — DULOXETINE HCL 20 MG PO CPEP
20.0000 mg | ORAL_CAPSULE | Freq: Two times a day (BID) | ORAL | Status: DC
  Administered 2020-03-04: 08:00:00 20 mg via ORAL
  Filled 2020-03-03: qty 1

## 2020-03-03 MED ORDER — SODIUM CHLORIDE 0.9% FLUSH: 3.0000 mL | INTRAVENOUS | Status: DC | PRN

## 2020-03-03 MED ORDER — SODIUM CHLORIDE 0.9 % IV BOLUS: 500.0000 mL | Freq: Once | INTRAVENOUS | Status: DC

## 2020-03-03 MED ORDER — ONDANSETRON HCL 4 MG/2ML IJ SOLN
4.0000 mg | Freq: Four times a day (QID) | INTRAMUSCULAR | Status: DC | PRN
  Administered 2020-03-03: 4 mg via INTRAVENOUS
  Filled 2020-03-03: qty 2

## 2020-03-03 MED ORDER — GUAIFENESIN-DM 100-10 MG/5ML PO SYRP: 15.0000 mL | ORAL_SOLUTION | ORAL | Status: DC | PRN

## 2020-03-03 MED ORDER — OXYCODONE HCL 5 MG PO TABS
ORAL_TABLET | ORAL | Status: AC
  Filled 2020-03-03: qty 1

## 2020-03-03 MED ORDER — MORPHINE SULFATE (PF) 4 MG/ML IV SOLN: 2.0000 mg | INTRAVENOUS | Status: DC | PRN

## 2020-03-03 MED ORDER — PANTOPRAZOLE SODIUM 40 MG PO TBEC
40.0000 mg | DELAYED_RELEASE_TABLET | Freq: Every day | ORAL | Status: DC
  Administered 2020-03-04: 40 mg via ORAL
  Filled 2020-03-03: qty 1

## 2020-03-03 MED ORDER — FENTANYL CITRATE (PF) 100 MCG/2ML IJ SOLN
INTRAMUSCULAR | Status: DC | PRN
  Administered 2020-03-03 (×3): 25 ug via INTRAVENOUS

## 2020-03-03 MED ORDER — CLOPIDOGREL BISULFATE 75 MG PO TABS
ORAL_TABLET | ORAL | Status: DC | PRN
  Administered 2020-03-03: 75 mg via ORAL

## 2020-03-03 MED ORDER — SODIUM CHLORIDE 0.9 % WEIGHT BASED INFUSION: 1.0000 mL/kg/h | INTRAVENOUS | Status: DC

## 2020-03-03 MED ORDER — FENTANYL CITRATE (PF) 100 MCG/2ML IJ SOLN
INTRAMUSCULAR | Status: AC
  Filled 2020-03-03: qty 2

## 2020-03-03 MED ORDER — LABETALOL HCL 5 MG/ML IV SOLN: 10.0000 mg | INTRAVENOUS | Status: DC | PRN

## 2020-03-03 MED ORDER — OXYCODONE HCL 5 MG PO TABS
5.0000 mg | ORAL_TABLET | ORAL | Status: DC | PRN
  Administered 2020-03-03 – 2020-03-04 (×3): 5 mg via ORAL
  Filled 2020-03-03: qty 2
  Filled 2020-03-03: qty 1

## 2020-03-03 MED ORDER — LINACLOTIDE 145 MCG PO CAPS
145.0000 ug | ORAL_CAPSULE | Freq: Every day | ORAL | Status: DC | PRN
  Filled 2020-03-03: qty 1

## 2020-03-03 MED ORDER — CLOPIDOGREL BISULFATE 75 MG PO TABS
ORAL_TABLET | ORAL | Status: AC
  Filled 2020-03-03: qty 1

## 2020-03-03 MED ORDER — IODIXANOL 320 MG/ML IV SOLN
INTRAVENOUS | Status: DC | PRN
  Administered 2020-03-03: 115 mL via INTRA_ARTERIAL

## 2020-03-03 MED ORDER — HEPARIN SODIUM (PORCINE) 1000 UNIT/ML IJ SOLN
INTRAMUSCULAR | Status: AC
  Filled 2020-03-03: qty 1

## 2020-03-03 MED ORDER — DICYCLOMINE HCL 10 MG PO CAPS
10.0000 mg | ORAL_CAPSULE | Freq: Three times a day (TID) | ORAL | Status: DC
  Administered 2020-03-04: 10 mg via ORAL
  Filled 2020-03-03 (×2): qty 1

## 2020-03-03 MED ORDER — HYDRALAZINE HCL 20 MG/ML IJ SOLN: 5.0000 mg | INTRAMUSCULAR | Status: DC | PRN

## 2020-03-03 MED ORDER — ROSUVASTATIN CALCIUM 20 MG PO TABS
40.0000 mg | ORAL_TABLET | Freq: Every day | ORAL | Status: DC
  Administered 2020-03-04: 40 mg via ORAL
  Filled 2020-03-03: qty 2

## 2020-03-03 MED ORDER — POTASSIUM CHLORIDE CRYS ER 20 MEQ PO TBCR: 20.0000 meq | EXTENDED_RELEASE_TABLET | Freq: Once | ORAL | Status: DC

## 2020-03-03 MED ORDER — SODIUM CHLORIDE 0.9 % IV SOLN: INTRAVENOUS | Status: DC

## 2020-03-03 MED ORDER — HEPARIN (PORCINE) IN NACL 1000-0.9 UT/500ML-% IV SOLN
INTRAVENOUS | Status: DC | PRN
  Administered 2020-03-03 (×2): 500 mL

## 2020-03-03 MED ORDER — LEVOTHYROXINE SODIUM 25 MCG PO TABS: 25.0000 ug | ORAL_TABLET | ORAL | Status: DC

## 2020-03-03 MED ORDER — MIDAZOLAM HCL 2 MG/2ML IJ SOLN
INTRAMUSCULAR | Status: AC
  Filled 2020-03-03: qty 2

## 2020-03-03 MED ORDER — LIDOCAINE HCL (PF) 1 % IJ SOLN
INTRAMUSCULAR | Status: AC
  Filled 2020-03-03: qty 30

## 2020-03-03 MED ORDER — ALUM & MAG HYDROXIDE-SIMETH 200-200-20 MG/5ML PO SUSP: 15.0000 mL | ORAL | Status: DC | PRN

## 2020-03-03 MED ORDER — MIDAZOLAM HCL 2 MG/2ML IJ SOLN
INTRAMUSCULAR | Status: DC | PRN
  Administered 2020-03-03 (×2): 1 mg via INTRAVENOUS

## 2020-03-03 SURGICAL SUPPLY — 23 items
CATH ANGIO 5F PIGTAIL 100CM (CATHETERS) ×1 IMPLANT
CATH OMNI FLUSH 5F 65CM (CATHETERS) ×1 IMPLANT
CATHETER LAUNCHER 6FR MP1 (CATHETERS) ×1 IMPLANT
DCB RANGER 6.0X40 135 (BALLOONS) IMPLANT
DCB RANGER 7.0X40 135 (BALLOONS) IMPLANT
DCB RANGER 7.0X60 135 (BALLOONS) IMPLANT
DEVICE TORQUE H2O (MISCELLANEOUS) ×1 IMPLANT
GUIDEWIRE ANGLED .035X260CM (WIRE) ×1 IMPLANT
KIT ENCORE 26 ADVANTAGE (KITS) ×1 IMPLANT
KIT MICROPUNCTURE NIT STIFF (SHEATH) ×1 IMPLANT
KIT PV (KITS) ×2 IMPLANT
RANGER DCB 6.0X40 135 (BALLOONS) ×2
RANGER DCB 7.0X40 135 (BALLOONS) ×2
RANGER DCB 7.0X60 135 (BALLOONS) ×2
SHEATH PINNACLE 6F 10CM (SHEATH) ×1 IMPLANT
SHEATH PROBE COVER 6X72 (BAG) ×1 IMPLANT
SYR MEDRAD MARK V 150ML (SYRINGE) ×1 IMPLANT
TRANSDUCER W/STOPCOCK (MISCELLANEOUS) ×3 IMPLANT
TRAY PV CATH (CUSTOM PROCEDURE TRAY) ×2 IMPLANT
TUBING HIGH PRESSURE 120CM (CONNECTOR) ×1 IMPLANT
WIRE BENTSON .035X145CM (WIRE) ×1 IMPLANT
WIRE G V18X300CM (WIRE) ×1 IMPLANT
WIRE HI TORQ VERSACORE J 260CM (WIRE) ×1 IMPLANT

## 2020-03-03 NOTE — Progress Notes (Signed)
Order for sheath removal verified per post procedural orders. Procedure explained to patient and left upper arm artery access site assessed: level 0, palpable dorsalis pedis and posterior tibial pulses. 6 French Sheath removed and manual pressure applied for 30 minutes. Pre, peri, & post procedural vitals: HR 60, RR 12, O2 Sat 97%, BP 110/68 Pain level 0. Distal pulses remained intact after sheath removal. Access site level 0 and dressed with arm board and tegaderm applied. RN confirmed condition of site. Post procedural instructions discussed with return demonstration from patient.

## 2020-03-03 NOTE — Op Note (Signed)
    Patient name: Erin [name] MRN: [id number] DOB: Dec 25, 1955 Sex: female  03/03/2020 Pre-operative Diagnosis: Chronic mesenteric ischemia Post-operative diagnosis:  Same Surgeon:  [name] Procedure Performed:  1.  Ultrasound-guided access, left brachial artery  2.  Abdominal aortogram  3.  Drug-coated balloon angioplasty, superior mesenteric artery  4.  Drug-coated balloon angioplasty, celiac artery  5.  Conscious sedation, 85 minutes    Indications: The patient has a history of aortobifemoral bypass graft.  She developed mesenteric ischemia and ended up getting stenting of her superior mesenteric artery and celiac artery in 2019.  She has had a return of symptoms with ultrasound suggesting stenosis with her stents, and so she is here for further evaluation and possible intervention  Procedure:  The patient was identified in the holding area and taken to room 8.  The patient was then placed supine on the table and prepped and draped in the usual sterile fashion.  A time out was called.  Conscious sedation was administered with the use of IV fentanyl and Versed under continuous physician and nurse monitoring.  Heart rate, blood pressure, and oxygen saturation were continuously monitored.  Total sedation time was 85 minutes.  Ultrasound was used to evaluate the left brachial artery.  It was patent .  A digital ultrasound image was acquired.  A micropuncture needle was used to access the left c brachial artery under ultrasound guidance.  An 018 wire was advanced without resistance and a micropuncture sheath was placed.  The 018 wire was removed and a benson wire was placed.  The micropuncture sheath was exchanged for a 6 French sheath.  2000 units of heparin and 200 mcg nitroglycerin were administered through the sheath.  Next using a Glidewire and pigtail catheter, the cath was advanced into the descending thoracic aorta and abdominal aortogram was performed..  Findings:   Aortogram: The  superior mesenteric and celiac artery stents appear to be patent.  There is intraluminal narrowing however based off of the obliquities we were able to obtain today, the degree of stenosis was difficult to determine, possibly around 50%.   Intervention: At this point, I decided to intervene.  Patient was fully heparinized.  Using a multipurpose guide catheter and a V-18 wire, the superior mesenteric artery was selected.  I used a 7 x 40 drug-coated Ranger balloon at nominal pressure for 3 minutes.  Completion imaging showed no residual stenosis.  The celiac artery was then cannulated and drug-coated balloon angioplasty using a 6 x 40 balloon was performed within the stent.  The balloon was taken to rated pressure for 3 minutes.  Follow-up imaging showed no appreciable stenosis.  Catheters and wires were removed.  The patient particularly here for sheath pull once her coagulation profile correct.  Impression:  #1  In-stent stenosis within the superior mesenteric artery, approximately 50% with drug-coated balloon angioplasty using a 7 mm balloon and no residual stenosis  #2  In-stent stenosis within the celiac artery stent approximately 50%, treated with drug-coated balloon angioplasty using a 6 mm balloon with no residual stenosis.   Theotis Burrow, M.D., [facility] [place] Office: [phone] Pager:  [phone]

## 2020-03-03 NOTE — Progress Notes (Addendum)
During shift report, fingers noticed to be a little more cool than previously, weak +1 radial pulse felt, good cap refill, and pt endorses previous numbness in fingers is improving. Hematoma still present and stable in size. No pain unless palpated. Updated husband per pt request, all questions answered. Will continue to monitor, pulses being checked Q1.  Jaymes Graff, RN   0200- L radial pulse stronger, fingers warmer, and pt endorses less pain. Hematoma stable. Will continue to closely monitor.

## 2020-03-03 NOTE — Progress Notes (Addendum)
Dr. Trula Slade made aware of BP running low 80's. Warm and dry. Resting quietly, easy to awaken. Left hand and fingers pink, sensation present.

## 2020-03-03 NOTE — Progress Notes (Signed)
BP cuff deflated and removed. Left upper arm(elbow to axillary) hematoma shifts. Now most of firmness is just above elbow.

## 2020-03-03 NOTE — Progress Notes (Signed)
BP cuff reapplied to mid left arm w/9mmHg. Good pleth, palpable left radial

## 2020-03-03 NOTE — Progress Notes (Signed)
Plan to admit for observation.  Arm appears stable with adequate perfusion  Erin [name]

## 2020-03-03 NOTE — Progress Notes (Signed)
Pt arrived from Cath lab. Pt c/a/ox4. Pt has hematoma to L brachial area. Dr. Trula Slade aware. CHG bath given. Vitals stable. Pt agrees to 4 hrs bedrest. Purwick in place. Pt oriented to room and call bell within reach. Will continue to monitor  Jerald Kief, RN

## 2020-03-03 NOTE — Progress Notes (Signed)
Patient with left arm hematoma after brachial sheath removal.  She had a blood pressure on the upper arm which I removed.  The area was soft I the upper arm, but more full over the stick site and so I replaced the BP cuff over the stick site at 41mmHg.  Will continue to monitor.  We discussed staying overnight  Wells Brabham

## 2020-03-03 NOTE — Progress Notes (Signed)
BP cuff deflated and removed. Closely watching. Hematoma shifts depending on where cuff is placed.

## 2020-03-03 NOTE — Interval H&P Note (Signed)
History and Physical Interval Note:  03/03/2020 3:50 PM  Erin [name]  has presented today for surgery, with the diagnosis of ischemia.  The various methods of treatment have been discussed with the patient and family. After consideration of risks, benefits and other options for treatment, the patient has consented to  Procedure(s): MESTENRIC ANGIOGRAPHY (N/A) as a surgical intervention.  The patient's history has been reviewed, patient examined, no change in status, stable for surgery.  I have reviewed the patient's chart and labs.  Questions were answered to the patient's satisfaction.     [name]

## 2020-03-03 NOTE — Progress Notes (Signed)
Dr. Trula Slade in to look at left arm. Removed BP cuff from upper left arm and placed mid arm-elbow and put it at 51mmHG. Good pleth.

## 2020-03-04 DIAGNOSIS — K551 Chronic vascular disorders of intestine: Secondary | ICD-10-CM | POA: Diagnosis not present

## 2020-03-04 LAB — CBC
HCT: 27.2 % — ABNORMAL LOW (ref 36.0–46.0)
Hemoglobin: 8.7 g/dL — ABNORMAL LOW (ref 12.0–15.0)
MCH: 34 pg (ref 26.0–34.0)
MCHC: 32 g/dL (ref 30.0–36.0)
MCV: 106.3 fL — ABNORMAL HIGH (ref 80.0–100.0)
Platelets: 199 10*3/uL (ref 150–400)
RBC: 2.56 MIL/uL — ABNORMAL LOW (ref 3.87–5.11)
RDW: 12.5 % (ref 11.5–15.5)
WBC: 7.3 10*3/uL (ref 4.0–10.5)
nRBC: 0 % (ref 0.0–0.2)

## 2020-03-04 LAB — BASIC METABOLIC PANEL
Anion gap: 8 (ref 5–15)
BUN: 10 mg/dL (ref 8–23)
CO2: 21 mmol/L — ABNORMAL LOW (ref 22–32)
Calcium: 8 mg/dL — ABNORMAL LOW (ref 8.9–10.3)
Chloride: 109 mmol/L (ref 98–111)
Creatinine, Ser: 0.79 mg/dL (ref 0.44–1.00)
GFR calc Af Amer: >60 mL/min (ref >60)
GFR calc non Af Amer: >60 mL/min (ref >60)
Glucose, Bld: 113 mg/dL — ABNORMAL HIGH (ref 70–99)
Potassium: 3.8 mmol/L (ref 3.5–5.1)
Sodium: 138 mmol/L (ref 135–145)

## 2020-03-04 NOTE — Discharge Instructions (Signed)
° °  [facility] [place] ° °Discharge Instructions ° °Lower Extremity Angiogram; Angioplasty/Stenting ° °Please refer to the following instructions for your post-procedure care. Your surgeon or physician assistant will discuss any changes with you. ° °Activity ° °Avoid lifting more than 8 pounds (1 gallons of milk) for 72 hours (3 days) after your procedure. You may walk as much as you can tolerate. It's OK to drive after 72 hours. ° °Bathing/Showering ° °You may shower the day after your procedure. If you have a bandage, you may remove it at 24- 48 hours. Clean your incision site with mild soap and water. Pat the area dry with a clean towel. ° °Diet ° °Resume your pre-procedure diet. There are no special food restrictions following this procedure. All patients with peripheral vascular disease should follow a low fat/low cholesterol diet. In order to heal from your surgery, it is CRITICAL to get adequate nutrition. Your body requires vitamins, minerals, and protein. Vegetables are the best source of vitamins and minerals. Vegetables also provide the perfect balance of protein. Processed food has little nutritional value, so try to avoid this. ° °Medications ° °Resume taking all of your medications unless your doctor tells you not to. If your incision is causing pain, you may take over-the-counter pain relievers such as acetaminophen (Tylenol) ° °Follow Up ° °Follow up will be arranged at the time of your procedure. You may have an office visit scheduled or may be scheduled for surgery. Ask your surgeon if you have any questions. ° °Please call us immediately for any of the following conditions: °•Severe or worsening pain your legs or feet at rest or with walking. °•Increased pain, redness, drainage at your groin puncture site. °•Fever of 101 degrees or higher. °•If you have any mild or slow bleeding from your puncture site: lie down, apply firm constant pressure over the area with a piece of  gauze or a clean wash cloth for 30 minutes- no peeking!, call 911 right away if you are still bleeding after 30 minutes, or if the bleeding is heavy and unmanageable. ° °Reduce your risk factors of vascular disease: ° °Stop smoking. If you would like help call [facility] at 1-800-QUIT-NOW (1-800-784-8669) or [place] at 336-586-4000. °Manage your cholesterol °Maintain a desired weight °Control your diabetes °Keep your blood pressure down ° °If you have any questions, please call the office at [phone] ° °

## 2020-03-04 NOTE — Progress Notes (Signed)
  Progress Note    03/04/2020 7:15 AM 1 Day Post-Op  Subjective:  Wants to go home; L arm soreness but grip strength and sensation in L hand intact.   Vitals:   03/04/20 0019 03/04/20 0409  BP: (!) 92/57 (!) 98/59  Pulse: 77 61  Resp: 16 15  Temp: 98.4 F (36.9 C) 98.2 F (36.8 C)  SpO2: 100% 97%   Physical Exam Lungs:  Non labored Extremities:  L brachial artery cath site with hematoma but soft Abd: soft, some tenderness LLQ Neurologic: A&O  CBC    Component Value Date/Time   WBC 7.3 03/04/2020 0317   RBC 2.56 (L) 03/04/2020 0317   HGB 8.7 (L) 03/04/2020 0317   HGB 11.9 11/08/2019 1403   HGB 10.6 (L) 11/28/2013 1148   HCT 27.2 (L) 03/04/2020 0317   HCT 35.7 11/08/2019 1403   HCT 31.2 (L) 11/28/2013 1148   PLT 199 03/04/2020 0317   PLT 250 11/08/2019 1403   MCV 106.3 (H) 03/04/2020 0317   MCV 99 (H) 11/08/2019 1403   MCV 101.4 (H) 11/28/2013 1148   MCH 34.0 03/04/2020 0317   MCHC 32.0 03/04/2020 0317   RDW 12.5 03/04/2020 0317   RDW 11.8 11/08/2019 1403   RDW 15.4 (H) 11/28/2013 1148   LYMPHSABS 1.2 02/12/2020 1219   LYMPHSABS 1.1 11/08/2019 1403   LYMPHSABS 0.8 (L) 11/28/2013 1148   MONOABS 0.7 02/12/2020 1219   MONOABS 0.9 11/28/2013 1148   EOSABS 0.1 02/12/2020 1219   EOSABS 0.0 11/08/2019 1403   BASOSABS 0.0 02/12/2020 1219   BASOSABS 0.0 11/08/2019 1403   BASOSABS 0.0 11/28/2013 1148    BMET    Component Value Date/Time   NA 138 03/04/2020 0317   NA 135 11/08/2019 1403   NA 139 04/22/2014 1028   K 3.8 03/04/2020 0317   K 4.1 04/22/2014 1028   CL 109 03/04/2020 0317   CO2 21 (L) 03/04/2020 0317   CO2 23 04/22/2014 1028   GLUCOSE 113 (H) 03/04/2020 0317   GLUCOSE 81 04/22/2014 1028   BUN 10 03/04/2020 0317   BUN 18 11/08/2019 1403   BUN 8.8 04/22/2014 1028   CREATININE 0.79 03/04/2020 0317   CREATININE 0.8 04/22/2014 1028   CALCIUM 8.0 (L) 03/04/2020 0317   CALCIUM 9.1 04/22/2014 1028   GFRNONAA >60 03/04/2020 0317   GFRAA >60  03/04/2020 0317    INR    Component Value Date/Time   INR 0.96 10/13/2017 0517     Intake/Output Summary (Last 24 hours) at 03/04/2020 0715 Last data filed at 03/04/2020 [id number] Gross per 24 hour  Intake 1000 ml  Output 750 ml  Net 250 ml     Assessment/Plan:  64 y.o. female is s/p balloon angioplasty of celiac and SMA 1 Day Post-Op   L arm hematoma soft; L hand well perfused with palpable L radial pulse Home this morning after walking and eating   Dagoberto Ligas, PA-C [facility] [phone] 03/04/2020 7:15 AM

## 2020-03-09 NOTE — Discharge Summary (Signed)
.  Discharge Summary  Patient ID: Erin [name] PH:[id number] 64 y.o. 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 is a 64 year old female who is brought in as an outpatient and underwent abdominal aortogram with balloon angioplasty of SMA and celiac artery by Dr. Trula Slade on 03/03/2020 due to chronic mesenteric ischemia.  She tolerated the procedure well however postoperatively was found to have a hematoma at her left brachial artery catheterization site.  She was kept overnight in observation for this reason.  POD #1 left arm catheterization site had local ecchymosis however no firm palpable hematoma.  Left hand also well perfused with a palpable radial pulse.  Patient was ready for discharge home.  She will follow-up in office in 4 to 6 weeks with mesenteric artery duplex.  She was discharged home in stable condition.  Consults: None  Treatments: surgery: Abdominal aortogram with balloon angioplasty of SMA and celiac artery by Dr. Trula Slade on 03/03/2020  Discharge Exam: See progress note 03/04/20 Vitals:   03/04/20 0409 03/04/20 0753  BP: (!) 98/59 99/62  Pulse: 61 65  Resp: 15   Temp: 98.2 F (36.8 C) 98.3 F (36.8 C)  SpO2: 97% 97%     Disposition: Discharge disposition: 01-Home or Self Care       Patient Instructions:  Allergies as of 03/04/2020      Reactions   Amitiza [lubiprostone] Diarrhea   Uncontrollable diarrhea   Nortriptyline Other (See Comments)   Stomach distention   Sulfa Antibiotics Nausea And Vomiting, Other (See Comments)   GI  distress/pain   Tramadol Itching, Rash   Atorvastatin Nausea And Vomiting   Claritin [loratadine] Other (See Comments)   Hot flashes   Dexamethasone Itching   Prednisone Rash      Medication List    STOP taking these medications   Biotin 5000 5 MG Caps Generic drug: Biotin     TAKE these medications   acetaminophen 500 MG tablet Commonly known as: TYLENOL Take 1,000 mg by mouth daily as needed for headache (pain).   acyclovir ointment 5 % Commonly known as: ZOVIRAX Apply 1 application topically every 3 (three) hours as needed (fever blisters).   aspirin EC 81 MG tablet Take 81 mg by mouth daily.   clopidogrel 75 MG tablet Commonly known as: Plavix Take 1 tablet (75 mg total) by mouth daily.   cyanocobalamin 1000 MCG tablet Take 1 tablet (1,000 mcg total) by mouth daily.   dicyclomine 10 MG capsule Commonly known as: BENTYL TAKE 1 CAPSULE BY MOUTH 3 TIMES A DAY BEFORE MEALS   DULoxetine 20 MG capsule Commonly known as: CYMBALTA TAKE 2 CAPSULES BY MOUTH EVERY DAY   fluticasone 50 MCG/ACT nasal spray Commonly known as: FLONASE Place 1 spray into both nostrils daily.   gabapentin 600 MG tablet Commonly known as: NEURONTIN TAKE 1 TABLET BY MOUTH THREE TIMES DAILY GENERIC EQUIVALENT FOR NEURONTIN What changed: See the new instructions.   Klor-Con M20 20 MEQ tablet Generic drug: potassium chloride SA TAKE 1 TABLET BY MOUTH EVERY DAY   levothyroxine 50 MCG tablet Commonly known as: SYNTHROID 1/2 tab (23mcg) 4 days per week, 1 tab (  31mcg) 3 days per week What changed:   how much to take  how to take this  when to take this  additional instructions   linaclotide 145 MCG Caps capsule Commonly known as: LINZESS Take 145 mcg by mouth daily as needed (bowel movement).   meclizine 12.5 MG tablet Commonly known as: ANTIVERT Take 1 tablet (12.5 mg total) by mouth 3 (three) times daily as needed for dizziness.   metoprolol succinate 25 MG 24 hr  tablet Commonly known as: TOPROL-XL TAKE 1 TABLET BY MOUTH TWICE A DAY   nitroGLYCERIN 0.4 MG SL tablet Commonly known as: NITROSTAT Place 1 tablet (0.4 mg total) under the tongue every 5 (five) minutes as needed for chest pain.   nystatin-triamcinolone cream Commonly known as: MYCOLOG II Apply 1 application topically 2 (two) times daily as needed (dry skin).   omeprazole 40 MG capsule Commonly known as: PRILOSEC TAKE 1 CAPSULE BY MOUTH EVERY DAY   rosuvastatin 40 MG tablet Commonly known as: CRESTOR Take 1 tablet (40 mg total) by mouth daily.   Vitamin D 50 MCG (2000 UT) tablet Take 2,000 Units by mouth daily.   Xarelto 2.5 MG Tabs tablet Generic drug: rivaroxaban TAKE 1 TABLET BY MOUTH TWICE A DAY      Activity: activity as tolerated Diet: regular diet Wound Care: none needed  Follow-up with Dr. Trula Slade in 5 weeks.  Signed: Dagoberto Ligas, PA-C 03/09/2020 1:29 PM [facility] Office: [phone]

## 2020-03-31 ENCOUNTER — Telehealth: Payer: Self-pay | Admitting: Family Medicine

## 2020-03-31 NOTE — Telephone Encounter (Signed)
The patient is asking if  she could have her labs done at   [facility]; she currently has an order placed for  02/17/2020

## 2020-04-01 ENCOUNTER — Other Ambulatory Visit: Payer: 59

## 2020-04-02 ENCOUNTER — Other Ambulatory Visit: Payer: Self-pay | Admitting: Family Medicine

## 2020-04-02 DIAGNOSIS — D649 Anemia, unspecified: Secondary | ICD-10-CM

## 2020-04-02 DIAGNOSIS — E039 Hypothyroidism, unspecified: Secondary | ICD-10-CM

## 2020-04-02 DIAGNOSIS — E785 Hyperlipidemia, unspecified: Secondary | ICD-10-CM

## 2020-04-02 NOTE — Telephone Encounter (Signed)
Spoke with the pt and informed her the orders were entered, they should be able to see the orders and she can call for an appt.

## 2020-04-02 NOTE — Telephone Encounter (Signed)
I have reordered labs today and changed to "lab collect"; please print/fax to [place] unless they are able to see order with this change.

## 2020-04-07 ENCOUNTER — Other Ambulatory Visit: Payer: Self-pay | Admitting: *Deleted

## 2020-04-07 DIAGNOSIS — K551 Chronic vascular disorders of intestine: Secondary | ICD-10-CM

## 2020-04-08 ENCOUNTER — Other Ambulatory Visit (HOSPITAL_COMMUNITY)
Admission: RE | Admit: 2020-04-08 | Discharge: 2020-04-08 | Disposition: A | Discharge status: Home / Self Care | Payer: 59 | Source: Ambulatory Visit | Attending: Family Medicine | Admitting: Family Medicine

## 2020-04-08 ENCOUNTER — Other Ambulatory Visit: Payer: Self-pay

## 2020-04-08 DIAGNOSIS — E785 Hyperlipidemia, unspecified: Secondary | ICD-10-CM | POA: Insufficient documentation

## 2020-04-08 DIAGNOSIS — E039 Hypothyroidism, unspecified: Secondary | ICD-10-CM | POA: Diagnosis not present

## 2020-04-08 DIAGNOSIS — E559 Vitamin D deficiency, unspecified: Secondary | ICD-10-CM | POA: Diagnosis not present

## 2020-04-08 DIAGNOSIS — D649 Anemia, unspecified: Secondary | ICD-10-CM | POA: Insufficient documentation

## 2020-04-08 LAB — CBC WITH DIFFERENTIAL/PLATELET
Abs Immature Granulocytes: 0.02 10*3/uL (ref 0.00–0.07)
Basophils Absolute: 0 10*3/uL (ref 0.0–0.1)
Basophils Relative: 0 %
Eosinophils Absolute: 0.1 10*3/uL (ref 0.0–0.5)
Eosinophils Relative: 1 %
HCT: 37.2 % (ref 36.0–46.0)
Hemoglobin: 11.5 g/dL — ABNORMAL LOW (ref 12.0–15.0)
Immature Granulocytes: 0 %
Lymphocytes Relative: 14 %
Lymphs Abs: 1 10*3/uL (ref 0.7–4.0)
MCH: 33.9 pg (ref 26.0–34.0)
MCHC: 30.9 g/dL (ref 30.0–36.0)
MCV: 109.7 fL — ABNORMAL HIGH (ref 80.0–100.0)
Monocytes Absolute: 0.7 10*3/uL (ref 0.1–1.0)
Monocytes Relative: 10 %
Neutro Abs: 4.9 10*3/uL (ref 1.7–7.7)
Neutrophils Relative %: 75 %
Platelets: 309 10*3/uL (ref 150–400)
RBC: 3.39 MIL/uL — ABNORMAL LOW (ref 3.87–5.11)
RDW: 13.1 % (ref 11.5–15.5)
WBC: 6.6 10*3/uL (ref 4.0–10.5)
nRBC: 0 % (ref 0.0–0.2)

## 2020-04-08 LAB — COMPREHENSIVE METABOLIC PANEL
ALT: 12 U/L (ref 0–44)
AST: 16 U/L (ref 15–41)
Albumin: 4 g/dL (ref 3.5–5.0)
Alkaline Phosphatase: 86 U/L (ref 38–126)
Anion gap: 11 (ref 5–15)
BUN: 17 mg/dL (ref 8–23)
CO2: 24 mmol/L (ref 22–32)
Calcium: 9 mg/dL (ref 8.9–10.3)
Chloride: 102 mmol/L (ref 98–111)
Creatinine, Ser: 0.67 mg/dL (ref 0.44–1.00)
GFR calc Af Amer: >60 mL/min (ref >60)
GFR calc non Af Amer: >60 mL/min (ref >60)
Glucose, Bld: 98 mg/dL (ref 70–99)
Potassium: 3.9 mmol/L (ref 3.5–5.1)
Sodium: 137 mmol/L (ref 135–145)
Total Bilirubin: 0.4 mg/dL (ref 0.3–1.2)
Total Protein: 7.2 g/dL (ref 6.5–8.1)

## 2020-04-08 LAB — IRON AND TIBC
Iron: 55 ug/dL (ref 28–170)
Saturation Ratios: 13 % (ref 10.4–31.8)
TIBC: 411 ug/dL (ref 250–450)
UIBC: 356 ug/dL

## 2020-04-08 LAB — TSH: TSH: 4.767 u[IU]/mL — ABNORMAL HIGH (ref 0.350–4.500)

## 2020-04-08 LAB — LIPID PANEL
Cholesterol: 166 mg/dL (ref 0–200)
HDL: 47 mg/dL (ref >40)
LDL Cholesterol: 83 mg/dL (ref 0–99)
Total CHOL/HDL Ratio: 3.5 RATIO
Triglycerides: 178 mg/dL — ABNORMAL HIGH (ref <150)
VLDL: 36 mg/dL (ref 0–40)

## 2020-04-08 LAB — VITAMIN D 25 HYDROXY (VIT D DEFICIENCY, FRACTURES): Vit D, 25-Hydroxy: 49.61 ng/mL (ref 30–100)

## 2020-04-08 LAB — FERRITIN: Ferritin: 26 ng/mL (ref 11–307)

## 2020-04-08 LAB — MAGNESIUM: Magnesium: 2 mg/dL (ref 1.7–2.4)

## 2020-04-10 ENCOUNTER — Other Ambulatory Visit: Payer: Self-pay | Admitting: *Deleted

## 2020-04-10 DIAGNOSIS — E039 Hypothyroidism, unspecified: Secondary | ICD-10-CM

## 2020-04-13 ENCOUNTER — Ambulatory Visit (HOSPITAL_COMMUNITY): Payer: 59

## 2020-04-23 ENCOUNTER — Other Ambulatory Visit: Payer: Self-pay | Admitting: Family Medicine

## 2020-04-28 ENCOUNTER — Other Ambulatory Visit: Payer: Self-pay | Admitting: Family Medicine

## 2020-04-28 NOTE — Telephone Encounter (Signed)
If she is still on plavix, then it would be a good idea to switch her to protonix 40mg  instead as the omeprazole has a chance of interacting with the plavix. If she is ok with this, we can send protonix 40mg  for 90 day supply w refill.

## 2020-04-29 ENCOUNTER — Telehealth: Payer: Self-pay | Admitting: Family Medicine

## 2020-04-29 MED ORDER — PANTOPRAZOLE SODIUM 40 MG PO TBEC: 40.0000 mg | DELAYED_RELEASE_TABLET | Freq: Every day | ORAL | 1 refills | Status: DC

## 2020-04-29 NOTE — Telephone Encounter (Signed)
Pt return your call and want a call back. 

## 2020-04-29 NOTE — Telephone Encounter (Signed)
Left a message for the pt to return my call.  

## 2020-04-29 NOTE — Telephone Encounter (Signed)
See prior message

## 2020-04-29 NOTE — Telephone Encounter (Signed)
Spoke with the pt and informed her of the message below.  Patient stated she is still taking Plavix, agreed to change to Protonix and is aware the [facility].

## 2020-05-11 ENCOUNTER — Ambulatory Visit (INDEPENDENT_AMBULATORY_CARE_PROVIDER_SITE_OTHER): Payer: 59 | Admitting: Physician Assistant

## 2020-05-11 ENCOUNTER — Ambulatory Visit (HOSPITAL_COMMUNITY)
Admission: RE | Admit: 2020-05-11 | Discharge: 2020-05-11 | Disposition: A | Discharge status: Home / Self Care | Payer: 59 | Source: Ambulatory Visit | Attending: Surgery | Admitting: Surgery

## 2020-05-11 ENCOUNTER — Other Ambulatory Visit: Payer: Self-pay

## 2020-05-11 VITALS — BP 130/77 | HR 72 | Temp 97.5°F | Resp 20 | Ht 63.0 in | Wt 104.4 lb

## 2020-05-11 DIAGNOSIS — K551 Chronic vascular disorders of intestine: Secondary | ICD-10-CM

## 2020-05-11 DIAGNOSIS — I739 Peripheral vascular disease, unspecified: Secondary | ICD-10-CM

## 2020-05-11 NOTE — Progress Notes (Signed)
POST OPERATIVE OFFICE NOTE    CC:  F/u for surgery  HPI:  This is a 64 y.o. female who is s/p abdominal angiography with balloon angioplasty of SMA and celiac artery by Dr. Trula Slade on 03/03/20 due to chronic mesenteric ischemia. She has had previous angiography with stenting of her SMA and Celiac arteries in July of 2019 by Dr. Trula Slade as well.  Following her most recent angiogram she did develop a left upper extremity hematoma at the brachial access site which was greatly improved at time of discharge.   She presently denies any post prandial pain, fear of food, weight loss, nausea, vomiting or changes in bowel movements. She states she has gained back a little bit of weight and she has a great appetite. She is not having any left upper extremity pain, numbness or weakness following hematoma resolution.  She has history of aortobifemoral bypass for critical ischemia in September 2018 and right femoral to above knee popliteal vein bypass  She does have some numbness in her 1st and 2nd toes of  Her right foot which she says is constant now which is a change. She does occasionally have "giving out" feeling in her legs that starts in her hips on prolonged ambulation.She also describes restlessness in her legs at night where her legs just kick involuntarily. She is taking an OTC restless leg medication that is helping. She denies otherwise any abdominal or back pain. She is not having any lower extremity claudication symptoms, rest pain or non healing wounds.  She is on Aspirin , Plavix, Rosuvastatin and Xarelto  Allergies  Allergen Reactions   Amitiza [Lubiprostone] Diarrhea    Uncontrollable diarrhea   Nortriptyline Other (See Comments)    Stomach distention   Sulfa Antibiotics Nausea And Vomiting and Other (See Comments)    GI distress/pain   Tramadol Itching and Rash   Atorvastatin Nausea And Vomiting   Claritin [Loratadine] Other (See Comments)    Hot flashes   Dexamethasone  Itching   Prednisone Rash    Current Outpatient Medications  Medication Sig Dispense Refill   acetaminophen (TYLENOL) 500 MG tablet Take 1,000 mg by mouth daily as needed for headache (pain).      acyclovir ointment (ZOVIRAX) 5 % Apply 1 application topically every 3 (three) hours as needed (fever blisters). 15 g 3   aspirin EC 81 MG tablet Take 81 mg by mouth daily.     Cholecalciferol (VITAMIN D) 2000 units tablet Take 2,000 Units by mouth daily.     clopidogrel (PLAVIX) 75 MG tablet Take 1 tablet (75 mg total) by mouth daily. 30 tablet 11   dicyclomine (BENTYL) 10 MG capsule TAKE 1 CAPSULE BY MOUTH 3 TIMES A DAY BEFORE MEALS 270 capsule 1   DULoxetine (CYMBALTA) 20 MG capsule TAKE 2 CAPSULES BY MOUTH EVERY DAY 180 capsule 3   fluticasone (FLONASE) 50 MCG/ACT nasal spray Place 1 spray into both nostrils daily. 48 g 1   gabapentin (NEURONTIN) 600 MG tablet TAKE 1 TABLET BY MOUTH THREE TIMES DAILY GENERIC EQUIVALENT FOR NEURONTIN 270 tablet 1   KLOR-CON M20 20 MEQ tablet TAKE 1 TABLET BY MOUTH EVERY DAY 90 tablet 1   levothyroxine (SYNTHROID) 50 MCG tablet 1/2 tab (40mcg) 4 days per week, 1 tab (61mcg) 3 days per week (Patient taking differently: Take 25-50 mcg by mouth See admin instructions. 1/2 tab (39mcg) 2 days per week, 1 tab (43mcg) 5 days per week) 90 tablet 1   linaclotide (LINZESS) 145 MCG  CAPS capsule Take 145 mcg by mouth daily as needed (bowel movement).      meclizine (ANTIVERT) 12.5 MG tablet Take 1 tablet (12.5 mg total) by mouth 3 (three) times daily as needed for dizziness. 30 tablet 5   metoprolol succinate (TOPROL-XL) 25 MG 24 hr tablet TAKE 1 TABLET BY MOUTH TWICE A DAY 180 tablet 3   nystatin-triamcinolone (MYCOLOG II) cream Apply 1 application topically 2 (two) times daily as needed (dry skin).   2   pantoprazole (PROTONIX) 40 MG tablet Take 1 tablet (40 mg total) by mouth daily. 90 tablet 1   rosuvastatin (CRESTOR) 40 MG tablet Take 1 tablet (40 mg  total) by mouth daily. 90 tablet 3   vitamin B-12 1000 MCG tablet Take 1 tablet (1,000 mcg total) by mouth daily. 30 tablet 0   nitroGLYCERIN (NITROSTAT) 0.4 MG SL tablet Place 1 tablet (0.4 mg total) under the tongue every 5 (five) minutes as needed for chest pain. 100 tablet 1   No current facility-administered medications for this visit.     ROS:  See HPI  Non invasive studies: Duplex Findings: 05/11/20   +-------------+--------+--------+   Celiac Stent  PSV cm/s EDV cm/s   +-------------+--------+--------+   Prox to stent   55          +-------------+--------+--------+   Origin      178          +-------------+--------+--------+   Mid       144          +-------------+--------+--------+   +-------------+--------+--------+   SMA Stent   PSV cm/s EDV cm/s   +-------------+--------+--------+   Prox to stent   55          +-------------+--------+--------+   Origin      176          +-------------+--------+--------+   Mid       188          +-------------+--------+--------+   Distal      194          +-------------+--------+--------+   Dx to stent   179          +-------------+--------+--------+    Physical Exam:  Vitals:   05/11/20 0931  BP: 130/77  Pulse: 72  Resp: 20  Temp: (!) 97.5 F (36.4 C)  TempSrc: Temporal  SpO2: 98%  Weight: 104 lb 6.4 oz (47.4 kg)  Height: 5\' 3"  (1.6 m)   General: thin, well nourished, well appearing, not in any discomfort Extremities:  2+ femoral pulses, 2+ popliteal pulses, 2+ DP and PT pulses bilaterally. Feet warm and well perfused Neuro: alert and oriented Abdomen:  Flat, soft, non tender, normal bowel sounds. No palpable masses  Assessment/Plan:  This is a 64 y.o. female who is s/p abdominal angiography with balloon angioplasty of SMA and celiac artery by Dr. Trula Slade on 03/03/20 due to chronic mesenteric ischemia. Her symptoms are now resolved. Her duplex  today shows no recurrent stenosis and patent SMA and Celiac stents. She does have some lower extremity symptoms but bilateral lower extremities clinically are well perfused. I will order lower extremity studies as she has not had any duplex studies or ABIs since 2019. - She will continue her Aspirin, Plavix, Statin and Xarelto - Advised her to call for earlier follow up if she has new or worsening symptoms -She will follow up in 6 months with mesenteric duplex, Aortobifem duplex, right bypass graft duplex and ABI's  Karoline Caldwell, PA-C [facility] [phone]

## 2020-05-13 ENCOUNTER — Other Ambulatory Visit: Payer: Self-pay | Admitting: *Deleted

## 2020-05-13 DIAGNOSIS — I739 Peripheral vascular disease, unspecified: Secondary | ICD-10-CM

## 2020-05-13 DIAGNOSIS — Z48812 Encounter for surgical aftercare following surgery on the circulatory system: Secondary | ICD-10-CM

## 2020-05-13 DIAGNOSIS — K551 Chronic vascular disorders of intestine: Secondary | ICD-10-CM

## 2020-05-22 ENCOUNTER — Other Ambulatory Visit: Payer: 59

## 2020-05-27 ENCOUNTER — Other Ambulatory Visit: Payer: 59

## 2020-06-25 ENCOUNTER — Telehealth: Payer: Self-pay | Admitting: Physician Assistant

## 2020-06-25 NOTE — Telephone Encounter (Signed)
    Pt is calling, she said she didn't get the chance to get her blood work done because she's been taking care of her Mother. She said she has time to get it done now but would like to make sure the order for blood work was fax to [facility] in [place]

## 2020-06-25 NOTE — Telephone Encounter (Signed)
Returned call to pt.  She has been made aware that the labs that we had ordered back in May have already been completed and she was good, as far as lab work, with our office. Pt was very grateful for the response.

## 2020-07-19 ENCOUNTER — Other Ambulatory Visit: Payer: Self-pay | Admitting: Family Medicine

## 2020-08-12 ENCOUNTER — Other Ambulatory Visit: Payer: Self-pay | Admitting: Cardiovascular Disease

## 2020-08-12 ENCOUNTER — Ambulatory Visit: Payer: 59 | Admitting: Family Medicine

## 2020-08-18 ENCOUNTER — Ambulatory Visit: Payer: Self-pay | Admitting: Neurology

## 2020-09-17 ENCOUNTER — Other Ambulatory Visit: Payer: Self-pay | Admitting: Gastroenterology

## 2020-09-17 ENCOUNTER — Encounter: Payer: Self-pay | Admitting: Family Medicine

## 2020-09-17 DIAGNOSIS — K2970 Gastritis, unspecified, without bleeding: Secondary | ICD-10-CM

## 2020-09-17 DIAGNOSIS — R112 Nausea with vomiting, unspecified: Secondary | ICD-10-CM

## 2020-09-23 ENCOUNTER — Ambulatory Visit: Payer: 59 | Admitting: Family Medicine

## 2020-10-02 ENCOUNTER — Other Ambulatory Visit: Payer: Self-pay | Admitting: Neurology

## 2020-10-02 ENCOUNTER — Other Ambulatory Visit: Payer: Self-pay | Admitting: Gastroenterology

## 2020-10-02 DIAGNOSIS — K2970 Gastritis, unspecified, without bleeding: Secondary | ICD-10-CM

## 2020-10-02 DIAGNOSIS — R112 Nausea with vomiting, unspecified: Secondary | ICD-10-CM

## 2020-10-07 ENCOUNTER — Telehealth: Payer: Self-pay | Admitting: *Deleted

## 2020-10-07 ENCOUNTER — Ambulatory Visit: Payer: 59 | Admitting: Family Medicine

## 2020-10-07 ENCOUNTER — Other Ambulatory Visit: Payer: Self-pay

## 2020-10-07 ENCOUNTER — Encounter: Payer: Self-pay | Admitting: Family Medicine

## 2020-10-07 VITALS — BP 102/60 | HR 72 | Temp 98.3°F | Ht 63.0 in | Wt 107.3 lb

## 2020-10-07 DIAGNOSIS — K219 Gastro-esophageal reflux disease without esophagitis: Secondary | ICD-10-CM

## 2020-10-07 DIAGNOSIS — E876 Hypokalemia: Secondary | ICD-10-CM

## 2020-10-07 DIAGNOSIS — I5181 Takotsubo syndrome: Secondary | ICD-10-CM | POA: Diagnosis not present

## 2020-10-07 DIAGNOSIS — Z85048 Personal history of other malignant neoplasm of rectum, rectosigmoid junction, and anus: Secondary | ICD-10-CM

## 2020-10-07 DIAGNOSIS — D649 Anemia, unspecified: Secondary | ICD-10-CM

## 2020-10-07 DIAGNOSIS — I7409 Other arterial embolism and thrombosis of abdominal aorta: Secondary | ICD-10-CM

## 2020-10-07 DIAGNOSIS — I739 Peripheral vascular disease, unspecified: Secondary | ICD-10-CM | POA: Diagnosis not present

## 2020-10-07 DIAGNOSIS — I2510 Atherosclerotic heart disease of native coronary artery without angina pectoris: Secondary | ICD-10-CM

## 2020-10-07 DIAGNOSIS — R7309 Other abnormal glucose: Secondary | ICD-10-CM

## 2020-10-07 DIAGNOSIS — E538 Deficiency of other specified B group vitamins: Secondary | ICD-10-CM

## 2020-10-07 DIAGNOSIS — Z23 Encounter for immunization: Secondary | ICD-10-CM

## 2020-10-07 DIAGNOSIS — E039 Hypothyroidism, unspecified: Secondary | ICD-10-CM

## 2020-10-07 DIAGNOSIS — N760 Acute vaginitis: Secondary | ICD-10-CM

## 2020-10-07 DIAGNOSIS — B9689 Other specified bacterial agents as the cause of diseases classified elsewhere: Secondary | ICD-10-CM

## 2020-10-07 DIAGNOSIS — E785 Hyperlipidemia, unspecified: Secondary | ICD-10-CM

## 2020-10-07 MED ORDER — METRONIDAZOLE 500 MG PO TABS: 500.0000 mg | ORAL_TABLET | Freq: Two times a day (BID) | ORAL | 0 refills | Status: DC

## 2020-10-07 MED ORDER — DULOXETINE HCL 20 MG PO CPEP: ORAL_CAPSULE | ORAL | 3 refills | Status: DC

## 2020-10-07 NOTE — Telephone Encounter (Signed)
I spoke with the pt and informed her I was calling to apologize as the flu vaccine she was given today was the high dose, which is usually given to patients age 64 and up.  Marya Amsler, clinical supervisor was informed and stated the pt should be fine and if she has any problems or receives a bill to let us know.  Patient was informed of this as well and stated she is fine with this and will call back if needed.  [facility] portal event completed.

## 2020-10-07 NOTE — Progress Notes (Signed)
Erin [name] DOB: 1956-03-09 Encounter date: 10/07/2020  This is a 64 y.o. female who presents with Chief Complaint  Patient presents with  . Follow-up    History of present illness: Had spinal injection which went ok, but has felt like she has nerve issue in right thigh - tend to act up - took extra duloxetine which helps - uses if needed.   Has spot behind left ear that is getting larger.   Notes fishy odor vaginally. Did seem better after yeast treatment, but came right back after a couple of days. Feels like it has been this way since radiation.   Has restless legs. Gets up, sprays muscle spray, uses leg cream. Feels like that works. Has had this for a long while. She is happy with home treatment.   Hyperglycemia/borderline: -dx in 2014, well controlled -no meds  Hypothyroidism: -meds: levothyroxine  IBS/GERD/Chronic Mesenteric Ischemia(see below): On 4/13 she underwent balloon angioplasty of SMA and celiac artery for chronic mesenteric ischemia.  She did develop a hematoma at her left brachial arterial cath site. -sees GI (Dr. Fuller Plan) -meds: linzess and bentyl - takes the bentyl TID which helps relax stomach. Rarely taking the linzess because it is too strong. When she passes gas it smells like sulfer. Going to bathroom regularly without linzess - takes bentyl 4times per day which has been keeping her regular.    Hx of anal cancer: -managed by GI and oncology. She is so happy that she is having regular bowel movements now.   Hx CAD, cardiomyopathy, peripheral vascular disease: -sees cardiology and vasc surgery -s/p pci November 2018, s/p aortobifem and bilat fem to pop bypass 07/2017 -meds:asa, bb,xarelto, statin -mesenteric ischemia from celiac and SMA occlusive disease, seeing Dr. early for this, underwent stenting with Dr. Trula Slade in 06/2018  Hypokalemia: -hx of -takes potassium as directed -did not tol acei/arb    Depression/underweight/neuropathy/pain: -long hx neuropathy, sees neurology -had longstanding pain after vascular surgery -cymbalta helped all of the above -takes b12 for neuropathy, b12 def  pap smear: not due for repeat pap until 10/22. (08/2016 pap was normal/negative)  Mammogram: last 07/2018   Allergies  Allergen Reactions  . Amitiza [Lubiprostone] Diarrhea    Uncontrollable diarrhea  . Nortriptyline Other (See Comments)    Stomach distention  . Sulfa Antibiotics Nausea And Vomiting and Other (See Comments)    GI distress/pain  . Tramadol Itching and Rash  . Atorvastatin Nausea And Vomiting  . Claritin [Loratadine] Other (See Comments)    Hot flashes  . Dexamethasone Itching  . Prednisone Rash   Current Meds  Medication Sig  . acetaminophen (TYLENOL) 500 MG tablet Take 1,000 mg by mouth daily as needed for headache (pain).   Marland Kitchen acyclovir ointment (ZOVIRAX) 5 % Apply 1 application topically every 3 (three) hours as needed (fever blisters).  Marland Kitchen aspirin EC 81 MG tablet Take 81 mg by mouth daily.  . Cholecalciferol (VITAMIN D) 2000 units tablet Take 2,000 Units by mouth daily.  . clopidogrel (PLAVIX) 75 MG tablet Take 1 tablet (75 mg total) by mouth daily.  Marland Kitchen dicyclomine (BENTYL) 10 MG capsule TAKE 1 CAPSULE BY MOUTH 3 TIMES A DAY BEFORE MEALS  . DULoxetine (CYMBALTA) 20 MG capsule TAKE 2-3 CAPSULES BY MOUTH EVERY DAY  . fluticasone (FLONASE) 50 MCG/ACT nasal spray Place 1 spray into both nostrils daily.  Marland Kitchen gabapentin (NEURONTIN) 600 MG tablet TAKE 1 TABLET BY MOUTH THREE TIMES DAILY GENERIC EQUIVALENT FOR NEURONTIN  . KLOR-CON M20 20  MEQ tablet TAKE 1 TABLET BY MOUTH EVERY DAY  . linaclotide (LINZESS) 145 MCG CAPS capsule Take 145 mcg by mouth daily as needed (bowel movement).   . meclizine (ANTIVERT) 12.5 MG tablet Take 1 tablet (12.5 mg total) by mouth 3 (three) times daily as needed for dizziness.  . metoprolol succinate (TOPROL-XL) 25 MG 24 hr tablet TAKE 1 TABLET BY  MOUTH TWICE A DAY  . nystatin-triamcinolone (MYCOLOG II) cream Apply 1 application topically 2 (two) times daily as needed (dry skin).   . pantoprazole (PROTONIX) 40 MG tablet Take 1 tablet (40 mg total) by mouth daily.  . rosuvastatin (CRESTOR) 40 MG tablet Take 1 tablet (40 mg total) by mouth daily.  . vitamin B-12 1000 MCG tablet Take 1 tablet (1,000 mcg total) by mouth daily.  . [DISCONTINUED] DULoxetine (CYMBALTA) 20 MG capsule TAKE 2 CAPSULES BY MOUTH EVERY DAY  . [DISCONTINUED] levothyroxine (SYNTHROID) 50 MCG tablet 1/2 tab (74mcg) 4 days per week, 1 tab (1mcg) 3 days per week (Patient taking differently: Take 25-50 mcg by mouth See admin instructions. 1/2 tab (46mcg) 2 days per week, 1 tab (24mcg) 5 days per week)    Review of Systems  Constitutional: Negative for chills, fatigue and fever.  Respiratory: Negative for cough, chest tightness, shortness of breath and wheezing.   Cardiovascular: Negative for chest pain, palpitations and leg swelling.  Genitourinary:       Fishy vaginal odor; not improved with yeast treatment. No discharge.    Objective:  BP 102/60 (BP Location: Left Arm, Patient Position: Sitting, Cuff Size: Normal)   Pulse 72   Temp 98.3 F (36.8 C) (Oral)   Ht 5\' 3"  (1.6 m)   Wt 107 lb 4.8 oz (48.7 kg)   BMI 19.01 kg/m   Weight: 107 lb 4.8 oz (48.7 kg)   BP Readings from Last 3 Encounters:  10/07/20 102/60  05/11/20 130/77  03/04/20 99/62   Wt Readings from Last 3 Encounters:  10/07/20 107 lb 4.8 oz (48.7 kg)  05/11/20 104 lb 6.4 oz (47.4 kg)  03/03/20 100 lb (45.4 kg)    Physical Exam Constitutional:      General: She is not in acute distress.    Appearance: She is well-developed.  Cardiovascular:     Rate and Rhythm: Normal rate and regular rhythm.     Heart sounds: Normal heart sounds. No murmur heard.  No friction rub.  Pulmonary:     Effort: Pulmonary effort is normal. No respiratory distress.     Breath sounds: Normal breath sounds. No  wheezing or rales.  Musculoskeletal:     Right lower leg: No edema.     Left lower leg: No edema.  Neurological:     Mental Status: She is alert and oriented to person, place, and time.  Psychiatric:        Behavior: Behavior normal.     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 She does follow with vascular/cario. Continue to monitor bp and lipids.  2. Coronary artery disease involving native coronary artery of native heart without angina pectoris She is on statin. bp is well controlled.  3. Stress-induced cardiomyopathy She does follow with cardiology regularly.  She is on Toprol 25 mg daily.  4. Peripheral vascular disease ([facility]) Symptoms are stable.  She does follow regularly with vascular surgery.  5. PAD (peripheral artery disease) ([place]) She does follow with vascular surgery.  Symptoms are stable.  6. GERD without  esophagitis Continue with Protonix 40 mg daily.  Symptoms are stable.  7. Hypothyroidism, unspecified type We will recheck thyroid levels for stability.  Currently has been taking 50 mcg daily dose. - TSH; Future - TSH - TSH; Future  8. Hyperlipidemia, unspecified hyperlipidemia type Continue with Crestor 40 mg daily.  Continue with healthy eating.  9. Hypokalemia Continue with potassium 20 mEq daily.  We will continue to monitor.  10. Elevated glucose Generally has been well controlled.  We will continue to monitor.  11. History of anal cancer She does follow with oncology regularly.  12. Anemia, unspecified type We will recheck levels for stability. - CBC with Differential/Platelet; Future - CBC with Differential/Platelet  13. B12 deficiency Continue with supplemental B12.  We will recheck levels for baseline. - Vitamin B12; Future - Vitamin B12  14. Bacterial vaginitis Vaginal as directed.  Let me know if symptoms are not resolved after treatment.  15. Need for immunization against  influenza - Flu Vaccine QUAD High Dose(Fluad)    Return in about 6 months (around 04/06/2021) for physical exam.    Micheline Rough, MD

## 2020-10-08 LAB — CBC WITH DIFFERENTIAL/PLATELET
Absolute Monocytes: 657 cells/uL (ref 200–950)
Basophils Absolute: 20 cells/uL (ref 0–200)
Basophils Relative: 0.3 %
Eosinophils Absolute: 27 cells/uL (ref 15–500)
Eosinophils Relative: 0.4 %
HCT: 38.9 % (ref 35.0–45.0)
Hemoglobin: 12.9 g/dL (ref 11.7–15.5)
Lymphs Abs: 884 cells/uL (ref 850–3900)
MCH: 33.2 pg — ABNORMAL HIGH (ref 27.0–33.0)
MCHC: 33.2 g/dL (ref 32.0–36.0)
MCV: 100.3 fL — ABNORMAL HIGH (ref 80.0–100.0)
MPV: 10.1 fL (ref 7.5–12.5)
Monocytes Relative: 9.8 %
Neutro Abs: 5112 cells/uL (ref 1500–7800)
Neutrophils Relative %: 76.3 %
Platelets: 258 10*3/uL (ref 140–400)
RBC: 3.88 10*6/uL (ref 3.80–5.10)
RDW: 11.5 % (ref 11.0–15.0)
Total Lymphocyte: 13.2 %
WBC: 6.7 10*3/uL (ref 3.8–10.8)

## 2020-10-08 LAB — TSH: TSH: 5.36 mIU/L — ABNORMAL HIGH (ref 0.40–4.50)

## 2020-10-08 LAB — VITAMIN B12: Vitamin B-12: 897 pg/mL (ref 200–1100)

## 2020-10-09 MED ORDER — LEVOTHYROXINE SODIUM 75 MCG PO TABS: 75.0000 ug | ORAL_TABLET | Freq: Every day | ORAL | 1 refills | Status: DC

## 2020-10-23 ENCOUNTER — Other Ambulatory Visit: Payer: Self-pay | Admitting: Family Medicine

## 2020-10-28 ENCOUNTER — Telehealth: Payer: Self-pay | Admitting: Family Medicine

## 2020-10-28 ENCOUNTER — Other Ambulatory Visit: Payer: Self-pay | Admitting: Family Medicine

## 2020-10-28 MED ORDER — MECLIZINE HCL 12.5 MG PO TABS
12.5000 mg | ORAL_TABLET | Freq: Three times a day (TID) | ORAL | 5 refills | Status: DC | PRN
Start: 2020-10-28 — End: 2021-04-07

## 2020-10-28 NOTE — Telephone Encounter (Signed)
Pt would like a refill for meclizine 12.5mg  tab. Pt is asking if Dr. Ethlyn Gallery can up the dosage due to recently the patient been having to take 2 pills to help with vertigo.   Please send to : [id number] - [place], [place]  [address] [place], [place] [address]  Phone:  [phone] Fax:  [phone]

## 2020-10-28 NOTE — Telephone Encounter (Signed)
done

## 2020-11-15 ENCOUNTER — Other Ambulatory Visit: Payer: Self-pay | Admitting: Family Medicine

## 2020-11-21 ENCOUNTER — Other Ambulatory Visit: Payer: Self-pay | Admitting: Neurology

## 2020-11-21 ENCOUNTER — Other Ambulatory Visit: Payer: Self-pay | Admitting: Gastroenterology

## 2020-11-21 DIAGNOSIS — K2970 Gastritis, unspecified, without bleeding: Secondary | ICD-10-CM

## 2020-11-21 DIAGNOSIS — R112 Nausea with vomiting, unspecified: Secondary | ICD-10-CM

## 2020-11-28 ENCOUNTER — Other Ambulatory Visit: Payer: Self-pay | Admitting: Neurology

## 2020-12-16 ENCOUNTER — Other Ambulatory Visit: Payer: Self-pay

## 2020-12-16 ENCOUNTER — Other Ambulatory Visit (INDEPENDENT_AMBULATORY_CARE_PROVIDER_SITE_OTHER): Payer: 59

## 2020-12-16 DIAGNOSIS — E039 Hypothyroidism, unspecified: Secondary | ICD-10-CM

## 2020-12-16 LAB — TSH: TSH: 0.21 u[IU]/mL — ABNORMAL LOW (ref 0.35–4.50)

## 2020-12-16 NOTE — Addendum Note (Signed)
Addended by: Tessie Fass D on: 12/16/2020 10:26 AM   Modules accepted: Orders

## 2020-12-18 NOTE — Addendum Note (Signed)
Addended by: Agnes Lawrence on: 12/18/2020 10:40 AM   Modules accepted: Orders

## 2020-12-22 ENCOUNTER — Other Ambulatory Visit: Payer: Self-pay | Admitting: Gastroenterology

## 2020-12-22 DIAGNOSIS — R112 Nausea with vomiting, unspecified: Secondary | ICD-10-CM

## 2020-12-22 DIAGNOSIS — K2970 Gastritis, unspecified, without bleeding: Secondary | ICD-10-CM

## 2020-12-30 ENCOUNTER — Ambulatory Visit (INDEPENDENT_AMBULATORY_CARE_PROVIDER_SITE_OTHER)
Admission: RE | Admit: 2020-12-30 | Discharge: 2020-12-30 | Disposition: A | Discharge status: Home / Self Care | Payer: 59 | Source: Ambulatory Visit | Attending: Vascular Surgery | Admitting: Vascular Surgery

## 2020-12-30 ENCOUNTER — Ambulatory Visit (INDEPENDENT_AMBULATORY_CARE_PROVIDER_SITE_OTHER): Payer: 59 | Admitting: Physician Assistant

## 2020-12-30 ENCOUNTER — Ambulatory Visit (HOSPITAL_COMMUNITY)
Admission: RE | Admit: 2020-12-30 | Discharge: 2020-12-30 | Disposition: A | Discharge status: Home / Self Care | Payer: 59 | Source: Ambulatory Visit | Attending: Vascular Surgery | Admitting: Vascular Surgery

## 2020-12-30 ENCOUNTER — Other Ambulatory Visit: Payer: Self-pay

## 2020-12-30 VITALS — BP 100/70 | HR 83 | Temp 98.5°F | Resp 20 | Ht 63.0 in | Wt 108.9 lb

## 2020-12-30 DIAGNOSIS — K551 Chronic vascular disorders of intestine: Secondary | ICD-10-CM | POA: Insufficient documentation

## 2020-12-30 DIAGNOSIS — Z95828 Presence of other vascular implants and grafts: Secondary | ICD-10-CM | POA: Diagnosis not present

## 2020-12-30 DIAGNOSIS — Z48812 Encounter for surgical aftercare following surgery on the circulatory system: Secondary | ICD-10-CM | POA: Diagnosis not present

## 2020-12-30 DIAGNOSIS — I739 Peripheral vascular disease, unspecified: Secondary | ICD-10-CM

## 2020-12-30 NOTE — Progress Notes (Signed)
Office Note     CC:  follow up Requesting Provider:  [name], MD  HPI: Erin [name] is a 65 y.o. ([birth year]) female who presents for follow up of mesenteric ischemia and peripheral artery disease. She is s/p abdominal angiography with balloon angioplasty of SMA and celiac artery by Dr. Trula Slade on 03/03/20 due to chronic mesenteric ischemia. She has had previous angiography with stenting of her SMA and Celiac arteries in July of 2019 by Dr. Trula Slade as well.   She presently denies any post prandial pain, fear of food, weight loss, nausea, vomiting or changes in bowel movements. she has a great appetite and her weight is in a good place.  She has history of aortobifemoral bypass and right femoral to above knee popliteal vein bypass for critical ischemia 9/242018 by Dr. Donnetta Hutching  At her previous visit she was having some numbness in her 1st and 2nd toes of her right foot. She also reported occasionally feeling like her legs were "giving out" on prolonged ambulation.She has restlessness in her legs at night where her legs just kick involuntarily. She takes OTC restless leg medication.   Today she says she is having a lot of issues with her legs violently kicking at night time. She goes to pain management and they just started her on muscle relaxer and she says she has only taken it a couple nights and it has really helped. She additionally complains of pain in her hips which then radiates to her thighs on ambulation. If she rests it eases off and then with continued activity it will come back. She feels this has a lot to do with her radiation and degenerative hips. She denies otherwise any abdominal or back pain. She is not having any lower extremity claudication symptoms, rest pain or non healing wounds.  She is on Aspirin , Plavix, Rosuvastatin  The pt is on a statin for cholesterol management.  The pt is on a daily aspirin.   Other AC:  Plavix The pt is on BB for hypertension.    The pt is not diabetic.  Tobacco hx:  Former, 2015  Past Medical History:  Diagnosis Date  . Allergy   . Alopecia   . 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 Anxiety   . Aortoiliac occlusive disease ([place]) 08/14/2017  . B12 deficiency anemia 09/14/2015  . Blood transfusion without reported diagnosis   . BPPV (benign paroxysmal positional vertigo) 07/08/2015  . Cardiomyopathy ([place]) 11/03/2017  . Chronic back pain   . Chronic daily headache 03/29/2013   takes bc powder  . Closed right hip fracture ([place]) 09/10/2015  . Coronary artery disease involving native coronary artery of native heart without angina pectoris 10/13/2017   DES to mid RCA  . Depression   . Essential (hemorrhagic) thrombocythemia ([place]) 09/06/2018  . Family history of early CAD 04/08/2016  . GERD (gastroesophageal reflux disease)   . History of hiatal hernia   . Hot flashes   . Hyperlipidemia   . Hyperlipidemia associated with type 2 diabetes mellitus ([place]) 05/11/2017  . Hypothyroidism 09/10/2015  . IBS (irritable bowel syndrome) 03/29/2013  . Neck pain 07/08/2015  . 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 Peripheral vascular disease ([employer]) 11/03/2017  . QT prolongation   . Sacroiliitis ([place]) 09/06/2018  . Spasms of the hands or feet 10/08/2017  . Syncope 06/07/2016  . Tubular adenoma of colon 09/08/2003  . Vertigo   .  Wears glasses     Past Surgical History:  Procedure Laterality Date  . ABDOMINAL AORTOGRAM N/A 08/02/2017   Procedure: ABDOMINAL AORTOGRAM;  Surgeon: Wellington Hampshire, MD;  Location: [facility];  Service: Cardiovascular;  Laterality: N/A;  . AORTA - BILATERAL FEMORAL ARTERY BYPASS GRAFT N/A 08/14/2017   Procedure: AORTA BIFEMORAL BYPASS GRAFT;  Surgeon: Rosetta Posner, MD;  Location: [facility];  Service: Vascular;  Laterality: N/A;  . COLONOSCOPY    . CORONARY STENT INTERVENTION N/A 10/13/2017   Procedure: CORONARY STENT  INTERVENTION;  Surgeon: Nelva Bush, MD;  Location: [facility];  Service: Cardiovascular;  Laterality: N/A;  . dental implant    . ECTOPIC PREGNANCY SURGERY    . EMBOLECTOMY N/A 08/14/2017   Procedure: EMBOLECTOMY FEMORAL;  Surgeon: Rosetta Posner, MD;  Location: [facility];  Service: Vascular;  Laterality: N/A;  . FEMORAL-POPLITEAL BYPASS GRAFT Right 08/14/2017   Procedure: Right Femoral to Above Knee Popliteal Bypass Graft using Non-Reversed Greater Saphenous Vein Graft from Right Leg;  Surgeon: Rosetta Posner, MD;  Location: [facility];  Service: Vascular;  Laterality: Right;  . FLEXIBLE SIGMOIDOSCOPY N/A 08/14/2013   Procedure: FLEXIBLE SIGMOIDOSCOPY;  Surgeon: Ladene Artist, MD;  Location: [facility];  Service: Endoscopy;  Laterality: N/A;  . LEFT HEART CATH AND CORONARY ANGIOGRAPHY N/A 10/13/2017   Procedure: LEFT HEART CATH AND CORONARY ANGIOGRAPHY;  Surgeon: Nelva Bush, MD;  Location: [facility];  Service: Cardiovascular;  Laterality: N/A;  . LOWER EXTREMITY ANGIOGRAPHY Bilateral 08/02/2017   Procedure: Lower Extremity Angiography;  Surgeon: Wellington Hampshire, MD;  Location: [facility];  Service: Cardiovascular;  Laterality: Bilateral;  . MULTIPLE TOOTH EXTRACTIONS    . PERIPHERAL VASCULAR INTERVENTION  05/22/2018   Procedure: PERIPHERAL VASCULAR INTERVENTION;  Surgeon: Serafina Mitchell, MD;  Location: [facility];  Service: Cardiovascular;;  SMA and Celiac  . PILONIDAL CYST EXCISION    . POLYPECTOMY    . THROMBECTOMY FEMORAL ARTERY Right 08/14/2017   Procedure: THROMBECTOMY FEMORAL ARTERY;  Surgeon: Rosetta Posner, MD;  Location: [facility];  Service: Vascular;  Laterality: Right;  . TONSILLECTOMY    . TOTAL HIP ARTHROPLASTY  09/11/2015   Procedure: TOTAL HIP ARTHROPLASTY;  Surgeon: Renette Butters, MD;  Location: [facility];  Service: Orthopedics;;  . ULTRASOUND GUIDANCE FOR VASCULAR ACCESS  10/13/2017   Procedure: Ultrasound Guidance For Vascular Access;  Surgeon:  Nelva Bush, MD;  Location: [facility];  Service: Cardiovascular;;  . VISCERAL ANGIOGRAPHY N/A 03/03/2020   Procedure: MESTENRIC ANGIOGRAPHY;  Surgeon: Serafina Mitchell, MD;  Location: [facility];  Service: Cardiovascular;  Laterality: N/A;    Social History   Socioeconomic History  . Marital status: Married    Spouse name: Not on file  . Number of children: 0  . Years of education: Not on file  . Highest education level: Not on file  Occupational History  . Occupation: caregiver  Tobacco Use  . Smoking status: Former Smoker    Packs/day: 0.50    Types: Cigarettes    Quit date: 10/07/2014    Years since quitting: 6.2  . Smokeless tobacco: Never Used  Vaping Use  . Vaping Use: Never used  Substance and Sexual Activity  . Alcohol use: No    Alcohol/week: 0.0 standard drinks  . Drug use: No  . Sexual activity: Yes    Partners: Male  Other Topics Concern  . Not on file  Social History Narrative   Work or School: homemaker      Home Situation: lives with her husband,[name] takes care of her elderly parents       Spiritual Beliefs: Christian      Lifestyle: no regular exercise, poor diet      Right handed             Social Determinants of Health   Financial Resource Strain: Not on file  Food Insecurity: Not on file  Transportation Needs: Not on file  Physical Activity: Not on file  Stress: Not on file  Social Connections: Not on file  Intimate Partner Violence: Not on file    Family History  Problem Relation Age of Onset  . Arthritis Mother   . Hyperlipidemia Mother   . Heart disease Mother   . Hypertension Mother   . Stroke Mother 60  . Irritable bowel syndrome Mother   . Thyroid disease Mother   . Heart attack Mother   . Heart disease Father   . Hyperlipidemia Father   . Hypertension Father   . Stroke Father 51  . Thyroid disease Father   . Prostate cancer Father   . Heart attack Father   . Lung cancer Brother 61  . Heart  attack Maternal Grandfather   . Heart attack Maternal Uncle   . Colon cancer Neg Hx   . Rectal cancer Neg Hx   . Stomach cancer Neg Hx   . Esophageal cancer Neg Hx     Current Outpatient Medications  Medication Sig Dispense Refill  . acetaminophen (TYLENOL) 500 MG tablet Take 1,000 mg by mouth daily as needed for headache (pain).     Marland Kitchen acyclovir ointment (ZOVIRAX) 5 % Apply 1 application topically every 3 (three) hours as needed (fever blisters). 15 g 3  . aspirin EC 81 MG tablet Take 81 mg by mouth daily.    . Cholecalciferol (VITAMIN D) 2000 units tablet Take 2,000 Units by mouth daily.    . clopidogrel (PLAVIX) 75 MG tablet Take 1 tablet (75 mg total) by mouth daily. 30 tablet 11  . dicyclomine (BENTYL) 10 MG capsule TAKE 1 CAPSULE BY MOUTH 3 TIMES A DAY BEFORE MEALS 270 capsule 0  . DULoxetine (CYMBALTA) 20 MG capsule TAKE 2-3 CAPSULES BY MOUTH EVERY DAY 270 capsule 3  . fluticasone (FLONASE) 50 MCG/ACT nasal spray Place 1 spray into both nostrils daily. 48 g 1  . gabapentin (NEURONTIN) 600 MG tablet TAKE 1 TABLET BY MOUTH THREE TIMES DAILY GENERIC EQUIVALENT FOR NEURONTIN 270 tablet 1  . KLOR-CON M20 20 MEQ tablet TAKE 1 TABLET BY MOUTH EVERY DAY 90 tablet 1  . levothyroxine (SYNTHROID) 75 MCG tablet Take 1 tablet (75 mcg total) by mouth daily. (Patient taking differently: Take 75 mcg by mouth daily. Take 1 tablet by mouth Monday-Friday, 1/2 tablet Saturday and Sunday) 90 tablet 1  . linaclotide (LINZESS) 145 MCG CAPS capsule Take 145 mcg by mouth daily as needed (bowel movement).     . meclizine (ANTIVERT) 12.5 MG tablet Take 1-2 tablets (12.5-25 mg total) by mouth 3 (three) times daily as needed for dizziness. 90 tablet 5  . metoprolol succinate (TOPROL-XL) 25 MG 24 hr tablet TAKE 1 TABLET BY MOUTH TWICE A DAY 180 tablet 1  . metroNIDAZOLE (FLAGYL) 500 MG tablet Take 1 tablet (500 mg total) by mouth 2 (two) times daily. 14 tablet 0  . nitroGLYCERIN (NITROSTAT) 0.4 MG SL tablet Place 1  tablet (0.4 mg total)  under the tongue every 5 (five) minutes as needed for chest pain. 100 tablet 1  . nystatin-triamcinolone (MYCOLOG II) cream Apply 1 application topically 2 (two) times daily as needed (dry skin).   2  . pantoprazole (PROTONIX) 40 MG tablet TAKE 1 TABLET BY MOUTH EVERY DAY 90 tablet 1  . rosuvastatin (CRESTOR) 40 MG tablet Take 1 tablet (40 mg total) by mouth daily. 90 tablet 3  . vitamin B-12 1000 MCG tablet Take 1 tablet (1,000 mcg total) by mouth daily. 30 tablet 0   No current facility-administered medications for this visit.    Allergies  Allergen Reactions  . Amitiza [Lubiprostone] Diarrhea    Uncontrollable diarrhea  . Nortriptyline Other (See Comments)    Stomach distention  . Sulfa Antibiotics Nausea And Vomiting and Other (See Comments)    GI distress/pain  . Tramadol Itching and Rash  . Atorvastatin Nausea And Vomiting  . Claritin [Loratadine] Other (See Comments)    Hot flashes  . Dexamethasone Itching  . Prednisone Rash     REVIEW OF SYSTEMS:   [X]  denotes positive finding, [ ]  denotes negative finding Cardiac  Comments:  Chest pain or chest pressure:    Shortness of breath upon exertion:    Short of breath when lying flat:    Irregular heart rhythm:        Vascular    Pain in calf, thigh, or hip brought on by ambulation:    Pain in feet at night that wakes you up from your sleep:     Blood clot in your veins:    Leg swelling:         Pulmonary    Oxygen at home:    Productive cough:     Wheezing:         Neurologic    Sudden weakness in arms or legs:     Sudden numbness in arms or legs:     Sudden onset of difficulty speaking or slurred speech:    Temporary loss of vision in one eye:     Problems with dizziness:         Gastrointestinal    Blood in stool:     Vomited blood:         Genitourinary    Burning when urinating:     Blood in urine:        Psychiatric    Major depression:         Hematologic    Bleeding  problems:    Problems with blood clotting too easily:        Skin    Rashes or ulcers:        Constitutional    Fever or chills:      PHYSICAL EXAMINATION:  There were no vitals filed for this visit.  General:  WDWN in NAD; vital signs documented above Gait: Normal HENT: WNL, normocephalic Pulmonary: normal non-labored breathing , without wheezing Cardiac: regular HR, without  Murmurs without carotid bruit Abdomen: soft, NT, no masses Vascular Exam/Pulses:  Right Left  Radial 2+ (normal) 2+ (normal)  Ulnar 1+ (weak) 1+ (weak)  Femoral 2+ (normal) 2+ (normal)  Popliteal Not palpable 2+ (normal)  DP 2+ (normal) 2+ (normal)  PT 2+ (normal) 2+ (normal)   Extremities: without ischemic changes, without Gangrene , without cellulitis; without open wounds;  Musculoskeletal: no muscle wasting or atrophy  Neurologic: A&O X 3;  No focal weakness or paresthesias are detected Psychiatric:  The pt has Normal affect.  Non-Invasive Vascular Imaging:   12/30/20 +-------+-----------+-----------+------------+------------+  ABI/TBIToday's ABIToday's TBIPrevious ABIPrevious TBI  +-------+-----------+-----------+------------+------------+  Right 1.01    0.60       0.99      0.34     +-------+-----------+-----------+------------+------------+  Left  0.90    0.58    0.87    0.48      +-------+-----------+-----------+------------+------------+  Right lower extremity bypass graft duplex shows patent right femoral to above knee bypass graft with no evidence of stenosis. Biphasic waveforms throughout.   Mesenteric duplex shows patent celiac and SMA stents with no evidence of restenosis  VAS US aorta/ivc/ iliacs shows patent aortobifemoral bypass graft  ASSESSMENT/PLAN:: 65 y.o. female here for follow up for mesenteric ischemia and peripheral artery disease. She is s/p abdominal angiography with balloon angioplasty of SMA and celiac artery by  Dr. Trula Slade on 03/03/20 due to chronic mesenteric ischemia. She has history of aortobifemoral bypass and right femoral to above knee popliteal vein bypass for critical ischemia 9/242018 by Dr. Donnetta Hutching. Her studies today show patency of her RLE bypass graft, her celiac and SMA stents and her aortobifemoral bypass graft. Her ABIs are stable. Her symptoms overall are stable. A lot of her pain sounds more musculoskeletal/ neuropathic vs due to her arterial disease. - She will continue her Aspirin, Statin, Plavix  -she will follow up in 1 year with repeat Aorto/ iliac duplex, mesenteric duplex, and RLE bypass graft duplex with ABIs   Karoline Caldwell, PA-C [facility] [phone]  Clinic MD:  Dr. Oneida Alar

## 2021-01-14 ENCOUNTER — Other Ambulatory Visit: Payer: Self-pay | Admitting: Family Medicine

## 2021-02-18 ENCOUNTER — Other Ambulatory Visit: Payer: Self-pay | Admitting: Cardiovascular Disease

## 2021-02-18 ENCOUNTER — Other Ambulatory Visit: Payer: Self-pay | Admitting: Physician Assistant

## 2021-02-24 ENCOUNTER — Ambulatory Visit (INDEPENDENT_AMBULATORY_CARE_PROVIDER_SITE_OTHER): Payer: 59 | Admitting: Physician Assistant

## 2021-02-24 ENCOUNTER — Encounter: Payer: Self-pay | Admitting: Gastroenterology

## 2021-02-24 ENCOUNTER — Encounter: Payer: Self-pay | Admitting: Physician Assistant

## 2021-02-24 ENCOUNTER — Other Ambulatory Visit: Payer: Self-pay

## 2021-02-24 ENCOUNTER — Ambulatory Visit: Payer: 59 | Admitting: Gastroenterology

## 2021-02-24 VITALS — BP 102/60 | HR 61 | Ht 63.0 in | Wt 112.4 lb

## 2021-02-24 VITALS — BP 98/60 | HR 60 | Ht 63.0 in | Wt 110.0 lb

## 2021-02-24 DIAGNOSIS — K219 Gastro-esophageal reflux disease without esophagitis: Secondary | ICD-10-CM

## 2021-02-24 DIAGNOSIS — I739 Peripheral vascular disease, unspecified: Secondary | ICD-10-CM | POA: Diagnosis not present

## 2021-02-24 DIAGNOSIS — I2510 Atherosclerotic heart disease of native coronary artery without angina pectoris: Secondary | ICD-10-CM | POA: Diagnosis not present

## 2021-02-24 DIAGNOSIS — I5032 Chronic diastolic (congestive) heart failure: Secondary | ICD-10-CM | POA: Diagnosis not present

## 2021-02-24 DIAGNOSIS — E785 Hyperlipidemia, unspecified: Secondary | ICD-10-CM

## 2021-02-24 DIAGNOSIS — Z86010 Personal history of colonic polyps: Secondary | ICD-10-CM

## 2021-02-24 DIAGNOSIS — K551 Chronic vascular disorders of intestine: Secondary | ICD-10-CM | POA: Diagnosis not present

## 2021-02-24 DIAGNOSIS — K5904 Chronic idiopathic constipation: Secondary | ICD-10-CM | POA: Diagnosis not present

## 2021-02-24 NOTE — Progress Notes (Signed)
    History of Present Illness: This is a 65 year old female with constipation, GERD, mesenteric ischemia, personal history of anal cancer, personal history of multiple tubular adenomatous colon polyps.  She has ongoing difficulties with constipation.  Linzess 145 mcg daily causes severe diarrhea with each dose.  She is taking it every several days.  We attempted to switch to 72 mcg daily however this was not covered by her insurance for some reason.  Her weight has increased to 110 pounds, BMI 19.49.Marland Kitchen  She is eating well without abdominal pain.  Current Medications, Allergies, Past Medical History, Past Surgical History, Family History and Social History were reviewed in [employer] record.   Physical Exam: General: Well developed, well nourished, no acute distress Head: Normocephalic and atraumatic Eyes: Sclerae anicteric, EOMI Ears: Normal auditory acuity Mouth: Not examined, mask on during Covid-19 pandemic Lungs: Clear throughout to auscultation Heart: Regular rate and rhythm; no murmurs, rubs or bruits Abdomen: Soft, non tender and non distended. No masses, hepatosplenomegaly or hernias noted. Normal Bowel sounds Rectal: Deferred to colonoscopy. Musculoskeletal: Symmetrical with no gross deformities  Pulses:  Normal pulses noted Extremities: No clubbing, cyanosis, edema or deformities noted Neurological: Alert oriented x 4, grossly nonfocal Psychological:  Alert and cooperative. Normal mood and affect   Assessment and Recommendations:  1.  Personal history of multiple tubular adenomatous colon polyps including piecemeal polypectomy of a 2.5 cm transverse colon polyp in December 2019. Surveillance colonoscopy is due in August 2022 with an extended bowel prep needed.   2. Chronic constipation. Change to Linzess 72 mcg qd. If not adequate trial of Trulance 3 mg qd. Pt advised to call in 2 weeks to report her progress.   3. GERD. Follow antireflux measures and  continue pantoprazole 40 mg po qd.   4.  Personal history of squamous cell anal cancer in 2014.  Status post chemoradiation.  5.  Chronic mesenteric ischemia.  No active symptoms at this time.  Status post balloon angioplasty of SMA and celiac artery in April 2021.  Status post SMA and celiac artery stenting in July 2019.  Status post aortobifem bypass in 2018 and right femoral to popliteal bypass.  Follow-up with vascular surgery as recommended.

## 2021-02-24 NOTE — Progress Notes (Addendum)
Cardiology Office Note:    Date:  02/24/2021   ID:  Erin [name], DOB 08-20-1956, MRN [id number]  PCP:  Erin [name], [facility]  Cardiologist:  Erin Mocha, MD   Advanced Practice Provider:  Liliane Shi, PA-C Electrophysiologist:  None       Referring MD: Erin Macadam, MD   Chief Complaint:  Follow-up (CAD)    Patient Profile:     Erin [name] is a 65 y.o. female with:   Coronary artery disease ? S/p DES to mid RCA 09/2017 ? Previously managed with ASA + rivaroxaban 2.5twice daily (COMPASS trial) ? 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 Echo 3/19: EF 55-60 ? Echo 12/18: EF 35-40  Peripheral arterial disease (Dr. Trula Slade) ? S/p aortobifem and bilateral femoral to popliteal bypasses 07/2017 ? Chronic mesenteric ischemia ? S/p balloon angioplasty to SMA and celiac 02/2020  Hypokalemia  Anal CA s/p XRT 2014  Diabetes mellitus  Hyperlipidemia  Prior CV studies: Echo 01/24/18 EF 55-60, no RWMA, normal diastolic function, trivial MR/TR  Echo 10/23/2017 EF 35-40  Cardiac catheterization 10/13/2017 LAD mid 40 LCx normal RCA proximal 30, mid 99, 30, distal 50 EF 40-45 PCI: 2 x 12 mm Resolute Onyx DES to the mid RCA  Nuclear stress test 08/09/2017 EF 44, normal perfusion, low risk  Echo 06/08/2016 EF 55-60, normal wall motion, grade 1 diastolic dysfunction    History of Present Illness:    [name] was previously followed by Dr. Saunders Revel.  She now sees me along with Dr. Burt Knack as needed.  She was last seen in 01/2020.  She underwent balloon angioplasty to the superior mesenteric artery and celiac artery in 02/2020 with Dr. Trula Slade.  She is here today with her husband.  She has been overall doing well.  She has not had any abdominal discomfort.  She has not had chest  discomfort.  She has not had significant shortness of breath, orthopnea, leg edema or syncope.         Past Medical History:  Diagnosis Date  . Allergy   . Alopecia   . Anal cancer ([place]) 08/14/13   invasive squamous cell ca, s/p radiation 10/20-11/26/14 60.4Gy/90fx and chemo  . Anxiety   . Aortoiliac occlusive disease ([place]) 08/14/2017  . B12 deficiency anemia 09/14/2015  . Blood transfusion without reported diagnosis   . BPPV (benign paroxysmal positional vertigo) 07/08/2015  . Cardiomyopathy ([place]) 11/03/2017  . Chronic back pain   . Chronic daily headache 03/29/2013   takes bc powder  . Closed right hip fracture ([place]) 09/10/2015  . Coronary artery disease involving native coronary artery of native heart without angina pectoris 10/13/2017   DES to mid RCA  . Depression   . Essential (hemorrhagic) thrombocythemia ([place]) 09/06/2018  . Family history of early CAD 04/08/2016  . GERD (gastroesophageal reflux disease)   . History of hiatal hernia   . Hot flashes   . Hyperlipidemia   . Hyperlipidemia associated with type 2 diabetes mellitus ([place]) 05/11/2017  . Hypothyroidism 09/10/2015  . IBS (irritable bowel syndrome) 03/29/2013  . Neck pain 07/08/2015  . 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 Peripheral vascular disease ([place]) 11/03/2017  . QT prolongation   . Sacroiliitis ([place]) 09/06/2018  . Spasms of the hands or feet 10/08/2017  . Syncope 06/07/2016  . Tubular adenoma of  colon 09/08/2003  . Vertigo   . Wears glasses     Current Medications: Current Meds  Medication Sig  . acetaminophen (TYLENOL) 500 MG tablet Take 1,000 mg by mouth daily as needed for headache (pain).   Marland Kitchen acyclovir ointment (ZOVIRAX) 5 % Apply 1 application topically every 3 (three) hours as needed (fever blisters).  Marland Kitchen aspirin EC 81 MG tablet Take 81 mg by mouth daily.  . Cholecalciferol (VITAMIN D) 2000 units tablet Take 2,000 Units by mouth daily.  . clopidogrel (PLAVIX) 75 MG tablet Take 1 tablet (75 mg  total) by mouth daily.  Marland Kitchen dicyclomine (BENTYL) 10 MG capsule TAKE 1 CAPSULE BY MOUTH 3 TIMES A DAY BEFORE MEALS  . DULoxetine (CYMBALTA) 20 MG capsule TAKE 2-3 CAPSULES BY MOUTH EVERY DAY  . fluticasone (FLONASE) 50 MCG/ACT nasal spray Place 1 spray into both nostrils daily.  Marland Kitchen gabapentin (NEURONTIN) 600 MG tablet TAKE 1 TABLET BY MOUTH THREE TIMES DAILY GENERIC EQUIVALENT FOR NEURONTIN  . KLOR-CON M20 20 MEQ tablet TAKE 1 TABLET BY MOUTH EVERY DAY  . levothyroxine (SYNTHROID) 75 MCG tablet Take 1 tablet (75 mcg total) by mouth daily.  Marland Kitchen linaclotide (LINZESS) 145 MCG CAPS capsule Take 75 mcg by mouth daily as needed (bowel movement).  . meclizine (ANTIVERT) 12.5 MG tablet Take 1-2 tablets (12.5-25 mg total) by mouth 3 (three) times daily as needed for dizziness.  . metoprolol succinate (TOPROL-XL) 25 MG 24 hr tablet TAKE 1 TABLET BY MOUTH TWICE A DAY  . nitroGLYCERIN (NITROSTAT) 0.4 MG SL tablet Place 1 tablet (0.4 mg total) under the tongue every 5 (five) minutes as needed for chest pain.  Marland Kitchen nystatin-triamcinolone (MYCOLOG II) cream Apply 1 application topically 2 (two) times daily as needed (dry skin).   . pantoprazole (PROTONIX) 40 MG tablet TAKE 1 TABLET BY MOUTH EVERY DAY  . rosuvastatin (CRESTOR) 40 MG tablet TAKE 1 TABLET BY MOUTH EVERY DAY  . tiZANidine (ZANAFLEX) 4 MG tablet TAKE 1/2 TABLET BY MOUTH THREE TIMES A DAY AS NEEDED FOR PAIN  . vitamin B-12 1000 MCG tablet Take 1 tablet (1,000 mcg total) by mouth daily.     Allergies:   Amitiza [lubiprostone], Nortriptyline, Sulfa antibiotics, Tramadol, Atorvastatin, Claritin [loratadine], Dexamethasone, and Prednisone   Social History   Tobacco Use  . Smoking status: Former Smoker    Packs/day: 0.50    Types: Cigarettes    Quit date: 10/07/2014    Years since quitting: 6.3  . Smokeless tobacco: Never Used  Vaping Use  . Vaping Use: Never used  Substance Use Topics  . Alcohol use: No    Alcohol/week: 0.0 standard drinks  . Drug  use: No     Family Hx: The patient's family history includes Arthritis in her mother; Heart attack in her father, maternal grandfather, maternal uncle, and mother; Heart disease in her father and mother; Hyperlipidemia in her father and mother; Hypertension in her father and mother; Irritable bowel syndrome in her mother; Lung cancer (age of onset: 18) in her brother; Prostate cancer in her father; Stroke (age of onset: 26) in her mother; Stroke (age of onset: 33) in her father; Thyroid disease in her father and mother. There is no history of Colon cancer, Rectal cancer, Stomach cancer, or Esophageal cancer.  ROS   EKGs/Labs/Other Test Reviewed:    EKG:  EKG is  ordered today.  The ekg ordered today demonstrates NSR, HR 61, normal axis, no ST-T wave changes, QTC 430  Recent Labs: 04/08/2020:  ALT 12; BUN 17; Creatinine, Ser 0.67; Magnesium 2.0; Potassium 3.9; Sodium 137 10/07/2020: Hemoglobin 12.9; Platelets 258 12/16/2020: TSH 0.21   Recent Lipid Panel Lab Results  Component Value Date/Time   CHOL 166 04/08/2020 12:26 PM   CHOL 304 (H) 02/19/2020 11:41 AM   TRIG 178 (H) 04/08/2020 12:26 PM   HDL 47 04/08/2020 12:26 PM   HDL 29 (L) 02/19/2020 11:41 AM   CHOLHDL 3.5 04/08/2020 12:26 PM   LDLCALC 83 04/08/2020 12:26 PM   LDLCALC 200 (H) 02/19/2020 11:41 AM   LDLDIRECT 214.0 09/26/2016 11:50 AM      Risk Assessment/Calculations:      Physical Exam:    VS:  BP 102/60   Pulse 61   Ht 5\' 3"  (1.6 m)   Wt 112 lb 6.4 oz (51 kg)   SpO2 98%   BMI 19.91 kg/m     Wt Readings from Last 3 Encounters:  02/24/21 112 lb 6.4 oz (51 kg)  02/24/21 110 lb (49.9 kg)  12/30/20 108 lb 14.4 oz (49.4 kg)     Constitutional:      Appearance: Healthy appearance. Not in distress.  Neck:     Vascular: No JVR. JVD normal.  Pulmonary:     Effort: Pulmonary effort is normal.     Breath sounds: No wheezing. No rales.  Cardiovascular:     Normal rate. Regular rhythm. Normal S1. Normal S2.      Murmurs: There is no murmur.  Edema:    Peripheral edema absent.  Abdominal:     Palpations: Abdomen is soft. There is no hepatomegaly.  Skin:    General: Skin is warm and dry.  Neurological:     Mental Status: Alert and oriented to person, place and time.     Cranial Nerves: Cranial nerves are intact.         ASSESSMENT & PLAN:    1. Coronary artery disease involving native coronary artery of native heart without angina pectoris History of drug-eluting stent to the mid RCA in November 2018.  She had mild to moderate nonobstructive disease elsewhere.  She had been maintained on aspirin along with low-dose rivaroxaban.  She has been switched back to clopidogrel along with aspirin due to cost.  She is tolerating this well.  She has not had anginal symptoms.  Continue current dose of metoprolol succinate, rosuvastatin, aspirin, clopidogrel.  Follow-up 1 year.  2. Peripheral vascular disease ([place]) Status post prior aortobifemoral and bilateral femoral to popliteal bypasses in 2018.  Status post balloon angioplasty to the SMA and celiac artery in 2021.  She is followed by vascular surgery.  3. HFimpEF (heart failure with improved EF) EF was previously 35-40.  This was likely stress-induced cardiomyopathy.  This is returned to normal by echocardiogram in 2019.  4. Hyperlipidemia LDL goal <70 Last LDL 5/21 was 83.  She has follow-up labs with her primary care provider in May.  If her LDL remains >70, I will add ezetimibe to her medical regimen.   Dispo:  Return in about 1 year (around 02/24/2022) for Routine Follow Up, w/ Richardson Dopp, PA-C, in person.   Medication Adjustments/Labs and Tests Ordered: Current medicines are reviewed at length with the patient today.  Concerns regarding medicines are outlined above.  Tests Ordered: Orders Placed This Encounter  Procedures  . EKG 12-Lead   Medication Changes: No orders of the defined types were placed in this encounter.   Signed, Richardson Dopp, PA-C  02/24/2021 5:42 PM  [facility] [address], [place], [place]  [address] Phone: [phone]; Fax: [phone]

## 2021-02-24 NOTE — Patient Instructions (Signed)
You have been given samples of Linzess 72 mcg to take one capsule daily.  Call our office in 2 weeks to let us know if this strength is better then the 145 mcg.   Thank you for choosing me and [facility].  Pricilla Riffle. Dagoberto Ligas., MD., [name]

## 2021-02-24 NOTE — Patient Instructions (Signed)
Medication Instructions:  Your physician recommends that you continue on your current medications as directed. Please refer to the Current Medication list given to you today.  *If you need a refill on your cardiac medications before your next appointment, please call your pharmacy*   Lab Work: -None  If you have labs (blood work) drawn today and your tests are completely normal, you will receive your results only by: Marland Kitchen MyChart Message (if you have MyChart) OR . A paper copy in the mail If you have any lab test that is abnormal or we need to change your treatment, we will call you to review the results.   Testing/Procedures: -None    Follow-Up: At [facility], you and your health needs are our priority.  As part of our continuing mission to provide you with exceptional heart care, we have created designated Provider Care Teams.  These Care Teams include your primary Cardiologist (physician) and Advanced Practice Providers (APPs -  Physician Assistants and Nurse Practitioners) who all work together to provide you with the care you need, when you need it.  We recommend signing up for the patient portal called "MyChart".  Sign up information is provided on this After Visit Summary.  MyChart is used to connect with patients for Virtual Visits (Telemedicine).  Patients are able to view lab/test results, encounter notes, upcoming appointments, etc.  Non-urgent messages can be sent to your provider as well.   To learn more about what you can do with MyChart, go to [email or web address].    Your next appointment:   1 year(s)  The format for your next appointment:   In Person  Provider:   Richardson Dopp, PA-C   Other Instructions Your physician wants you to follow-up in: 1 year with Richardson Dopp, PA.  You will receive a reminder letter in the mail two months in advance. If you don't receive a letter, please call our office to schedule the follow-up appointment.

## 2021-03-13 ENCOUNTER — Other Ambulatory Visit: Payer: Self-pay | Admitting: Surgery

## 2021-03-14 ENCOUNTER — Other Ambulatory Visit: Payer: Self-pay | Admitting: Physician Assistant

## 2021-03-15 ENCOUNTER — Other Ambulatory Visit: Payer: Self-pay | Admitting: Gastroenterology

## 2021-03-15 ENCOUNTER — Telehealth: Payer: Self-pay | Admitting: Gastroenterology

## 2021-03-15 DIAGNOSIS — R112 Nausea with vomiting, unspecified: Secondary | ICD-10-CM

## 2021-03-15 DIAGNOSIS — K2970 Gastritis, unspecified, without bleeding: Secondary | ICD-10-CM

## 2021-03-15 NOTE — Telephone Encounter (Signed)
Patient returned your call and requested a call back at the number provided.

## 2021-03-15 NOTE — Telephone Encounter (Signed)
Called patient on number provided and patient did not answer and no voicemail box is set up.

## 2021-03-15 NOTE — Telephone Encounter (Signed)
Left message for patient to return my call.

## 2021-03-16 MED ORDER — LINACLOTIDE 72 MCG PO CAPS: 72.0000 ug | ORAL_CAPSULE | Freq: Every day | ORAL | 11 refills | Status: DC

## 2021-03-16 NOTE — Telephone Encounter (Signed)
Patient states the Linzess 72 mcg has worked much better then the Linzess 145 mcg daily. Patient states she would like a prescription. Prescription for Linzess 72 mcg sent to pharmacy. Notified Dr. Fuller Plan of patient's improvement in her symptoms.

## 2021-03-24 ENCOUNTER — Telehealth: Payer: Self-pay | Admitting: Family Medicine

## 2021-03-24 NOTE — Telephone Encounter (Signed)
Patient is calling and wanted to see if an order can be faxed to [facility] to get labs drawn there. Fax number is [phone] Attention Lab. CB is [phone]

## 2021-03-25 NOTE — Telephone Encounter (Signed)
Spoke with the pt and informed her the lab order from 1/28 was faxed to [facility] at the number below.  Also reminded pt of the upcoming appt on 5/18.

## 2021-04-07 ENCOUNTER — Other Ambulatory Visit: Payer: Self-pay

## 2021-04-07 ENCOUNTER — Ambulatory Visit (INDEPENDENT_AMBULATORY_CARE_PROVIDER_SITE_OTHER): Payer: 59 | Admitting: Family Medicine

## 2021-04-07 ENCOUNTER — Encounter: Payer: Self-pay | Admitting: Family Medicine

## 2021-04-07 VITALS — BP 118/70 | HR 59 | Temp 98.5°F | Ht 63.0 in | Wt 113.3 lb

## 2021-04-07 DIAGNOSIS — K219 Gastro-esophageal reflux disease without esophagitis: Secondary | ICD-10-CM | POA: Diagnosis not present

## 2021-04-07 DIAGNOSIS — I739 Peripheral vascular disease, unspecified: Secondary | ICD-10-CM

## 2021-04-07 DIAGNOSIS — E785 Hyperlipidemia, unspecified: Secondary | ICD-10-CM

## 2021-04-07 DIAGNOSIS — Z0000 Encounter for general adult medical examination without abnormal findings: Secondary | ICD-10-CM

## 2021-04-07 DIAGNOSIS — E039 Hypothyroidism, unspecified: Secondary | ICD-10-CM

## 2021-04-07 DIAGNOSIS — R7309 Other abnormal glucose: Secondary | ICD-10-CM

## 2021-04-07 DIAGNOSIS — I2510 Atherosclerotic heart disease of native coronary artery without angina pectoris: Secondary | ICD-10-CM | POA: Diagnosis not present

## 2021-04-07 DIAGNOSIS — E611 Iron deficiency: Secondary | ICD-10-CM

## 2021-04-07 DIAGNOSIS — R42 Dizziness and giddiness: Secondary | ICD-10-CM

## 2021-04-07 LAB — COMPREHENSIVE METABOLIC PANEL
ALT: 8 U/L (ref 0–35)
AST: 13 U/L (ref 0–37)
Albumin: 4.4 g/dL (ref 3.5–5.2)
Alkaline Phosphatase: 90 U/L (ref 39–117)
BUN: 10 mg/dL (ref 6–23)
CO2: 26 mEq/L (ref 19–32)
Calcium: 8.9 mg/dL (ref 8.4–10.5)
Chloride: 104 mEq/L (ref 96–112)
Creatinine, Ser: 0.9 mg/dL (ref 0.40–1.20)
GFR: 67.51 mL/min (ref >60.00)
Glucose, Bld: 86 mg/dL (ref 70–99)
Potassium: 4.3 mEq/L (ref 3.5–5.1)
Sodium: 137 mEq/L (ref 135–145)
Total Bilirubin: 0.3 mg/dL (ref 0.2–1.2)
Total Protein: 6.7 g/dL (ref 6.0–8.3)

## 2021-04-07 LAB — CBC WITH DIFFERENTIAL/PLATELET
Basophils Absolute: 0 10*3/uL (ref 0.0–0.1)
Basophils Relative: 0.2 % (ref 0.0–3.0)
Eosinophils Absolute: 0 10*3/uL (ref 0.0–0.7)
Eosinophils Relative: 0.8 % (ref 0.0–5.0)
HCT: 36.7 % (ref 36.0–46.0)
Hemoglobin: 12.4 g/dL (ref 12.0–15.0)
Lymphocytes Relative: 16.4 % (ref 12.0–46.0)
Lymphs Abs: 1 10*3/uL (ref 0.7–4.0)
MCHC: 33.7 g/dL (ref 30.0–36.0)
MCV: 100.3 fl — ABNORMAL HIGH (ref 78.0–100.0)
Monocytes Absolute: 0.6 10*3/uL (ref 0.1–1.0)
Monocytes Relative: 9.8 % (ref 3.0–12.0)
Neutro Abs: 4.6 10*3/uL (ref 1.4–7.7)
Neutrophils Relative %: 72.8 % (ref 43.0–77.0)
Platelets: 250 10*3/uL (ref 150.0–400.0)
RBC: 3.66 Mil/uL — ABNORMAL LOW (ref 3.87–5.11)
RDW: 12.9 % (ref 11.5–15.5)
WBC: 6.3 10*3/uL (ref 4.0–10.5)

## 2021-04-07 LAB — LIPID PANEL
Cholesterol: 164 mg/dL (ref 0–200)
HDL: 39.6 mg/dL (ref >39.00)
NonHDL: 124.05
Total CHOL/HDL Ratio: 4
Triglycerides: 246 mg/dL — ABNORMAL HIGH (ref 0.0–149.0)
VLDL: 49.2 mg/dL — ABNORMAL HIGH (ref 0.0–40.0)

## 2021-04-07 LAB — TSH: TSH: 0.96 u[IU]/mL (ref 0.35–4.50)

## 2021-04-07 LAB — LDL CHOLESTEROL, DIRECT: Direct LDL: 70 mg/dL

## 2021-04-07 MED ORDER — ACYCLOVIR 5 % EX OINT: 1.0000 "application " | TOPICAL_OINTMENT | CUTANEOUS | 3 refills | Status: DC | PRN

## 2021-04-07 MED ORDER — MECLIZINE HCL 25 MG PO TABS: 25.0000 mg | ORAL_TABLET | Freq: Three times a day (TID) | ORAL | 5 refills | Status: DC | PRN

## 2021-04-07 NOTE — Progress Notes (Signed)
Erin [name] DOB: 01-07-1956 Encounter date: 04/07/2021  This is a 65 y.o. female who presents for complete physical   History of present illness/Additional concerns:  Had been having leg twinges and was written for muscle relaxer and works for her at night. Uses as needed. Just takes at night; rarely will use during day,but make her sleepy.  Stops kicking entirely with this.    Other issue she is having is vertigo: has meclizine 25mg  and it works better than 12.5mg . has bothered her for years. Was going to do rehab in the past.  Has fallen from this in past. Not coming every day. Most of the time laying back/turning head back will trigger this. Has had trouble with neck for years; might get injection which she thinks could help. Gets nausea with vertigo. No vision changes. No ringing in ears.    Hyperglycemia/borderline: -dx in 2014, well controlled -no meds  Hypothyroidism: -meds: levothyroxine  IBS/GERD/Chronic Mesenteric Ischemia(see below): On 4/13 she underwent balloon angioplasty of SMA and celiac artery for chronic mesenteric ischemia.  She did develop a hematoma at her left brachial arterial cath site. -sees GI(Dr. Fuller Plan) -meds: linzess and bentyl -takes the bentyl TID which helps relax stomach. Rarely taking the linzess because it is too strong. When she passes gas it smells like sulfer. Going to bathroom regularly without linzess - takes bentyl 4times per day which has been keeping her regular.   Hx of anal cancer: -managed by GI and oncology.  Bowel movements have been doing pretty well.  She cannot take the Linzess when she has plans during the day though because it makes her have to go to the bathroom multiple times.  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 early for this, underwent stenting with Dr. Trula Slade in 06/2018  Hypokalemia: -hx of -takes potassiumas directed -did not tol acei/arb   Depression/underweight/neuropathy/pain: -long hx neuropathy, sees neurology -had longstanding pain after vascular surgery -cymbalta helped all of the above -takes b12 for neuropathy, b12 def  Last Pap was done 09/12/2016 with negative HPV normal cells.  Some limitation resolved secondary to atrophy.  This is through [facility].  ?  Mammogram: She has been hesitant to schedule this because she is caring for her elderly parents (not locally) and she does not want to take the time to have the drive out to complete.  Past Medical History:  Diagnosis Date  . Allergy   . Alopecia   . Anal cancer ([place]) 08/14/13   invasive squamous cell ca, s/p radiation 10/20-11/26/14 60.4Gy/55fx and chemo  . Anxiety   . 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 B12 deficiency anemia 09/14/2015  . Blood transfusion without reported diagnosis   . BPPV (benign paroxysmal positional vertigo) 07/08/2015  . Cardiomyopathy ([place]) 11/03/2017  . Chronic back pain   . Chronic daily headache 03/29/2013   takes bc powder  . Closed right hip fracture ([place]) 09/10/2015  . Coronary artery disease involving native coronary artery of native heart without angina pectoris 10/13/2017   DES to mid RCA  . Depression   . Essential (hemorrhagic) thrombocythemia ([place]) 09/06/2018  . Family history of early CAD 04/08/2016  . GERD (gastroesophageal reflux disease)   . History of hiatal hernia   . Hot flashes   . Hyperlipidemia   . Hyperlipidemia associated  with type 2 diabetes mellitus ([place]) 05/11/2017  . Hypothyroidism 09/10/2015  . IBS (irritable bowel syndrome) 03/29/2013  . Neck pain 07/08/2015  . 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 Peripheral vascular disease ([place]) 11/03/2017  . QT prolongation   . Sacroiliitis ([place]) 09/06/2018  . Spasms of the hands  or feet 10/08/2017  . Syncope 06/07/2016  . Tubular adenoma of colon 09/08/2003  . Vertigo   . Wears glasses    Past Surgical History:  Procedure Laterality Date  . ABDOMINAL AORTOGRAM N/A 08/02/2017   Procedure: ABDOMINAL AORTOGRAM;  Surgeon: Wellington Hampshire, MD;  Location: [facility];  Service: Cardiovascular;  Laterality: N/A;  . AORTA - BILATERAL FEMORAL ARTERY BYPASS GRAFT N/A 08/14/2017   Procedure: AORTA BIFEMORAL BYPASS GRAFT;  Surgeon: Rosetta Posner, MD;  Location: [facility];  Service: Vascular;  Laterality: N/A;  . COLONOSCOPY    . CORONARY STENT INTERVENTION N/A 10/13/2017   Procedure: CORONARY STENT INTERVENTION;  Surgeon: Nelva Bush, MD;  Location: [facility] [facility];  Service: Cardiovascular;  Laterality: N/A;  . dental implant    . ECTOPIC PREGNANCY SURGERY    . EMBOLECTOMY N/A 08/14/2017   Procedure: EMBOLECTOMY FEMORAL;  Surgeon: Rosetta Posner, MD;  Location[facility] OR;  Service: Vascular;  Laterality: N/A;  . FEMORAL-POPLITEAL BYPASS GRAFT Right 08/14/2017   Procedure: Right Femoral to Above Knee Popliteal Bypass Graft using Non-Reversed Greater Saphenous Vein Graft from Right Leg;  Surgeon: Rosetta Posner, MD;  Location: [place];  Service: Vascular;  Laterality: Right;  . FLEXIBLE SIGMOIDOSCOPY N/A 08/14/2013   Procedure: FLEXIBLE SIGMOIDOSCOPY;  Surgeon: Ladene Artist, MD;  Location: [facility];  Service: Endoscopy;  Laterality: N/A;  . LEFT HEART CATH AND CORONARY ANGIOGRAPHY N/A 10/13/2017   Procedure: LEFT HEART CATH AND CORONARY ANGIOGRAPHY;  Surgeon: Nelva Bush, MD;  Location: [place] [facility];  Service: Cardiovascular;  Laterality: N/A;  . LOWER EXTREMITY ANGIOGRAPHY Bilateral 08/02/2017   Procedure: Lower Extremity Angiography;  Surgeon: Wellington Hampshire, MD;  Location: [place] [facility];  Service: Cardiovascular;  Laterality: Bilateral;  . MULTIPLE TOOTH EXTRACTIONS    . PERIPHERAL VASCULAR INTERVENTION  05/22/2018   Procedure: PERIPHERAL VASCULAR  INTERVENTION;  Surgeon: Serafina Mitchell, MD;  Location: [place] [facility];  Service: Cardiovascular;;  SMA and Celiac  . PILONIDAL CYST EXCISION    . POLYPECTOMY    . THROMBECTOMY FEMORAL ARTERY Right 08/14/2017   Procedure: THROMBECTOMY FEMORAL ARTERY;  Surgeon: Rosetta Posner, MD;  Location: [facility];  Service: Vascular;  Laterality: Right;  . TONSILLECTOMY    . TOTAL HIP ARTHROPLASTY  09/11/2015   Procedure: TOTAL HIP ARTHROPLASTY;  Surgeon: Renette Butters, MD;  Location: [facility];  Service: Orthopedics;;  . ULTRASOUND GUIDANCE FOR VASCULAR ACCESS  10/13/2017   Procedure: Ultrasound Guidance For Vascular Access;  Surgeon: Nelva Bush, MD;  Location: [facility];  Service: Cardiovascular;;  . VISCERAL ANGIOGRAPHY N/A 03/03/2020   Procedure: MESTENRIC ANGIOGRAPHY;  Surgeon: Serafina Mitchell, MD;  Location: [facility];  Service: Cardiovascular;  Laterality: N/A;   Allergies  Allergen Reactions  . Amitiza [Lubiprostone] Diarrhea    Uncontrollable diarrhea  . Nortriptyline Other (See Comments)    Stomach distention  . Sulfa Antibiotics Nausea And Vomiting and Other (See Comments)    GI distress/pain  . Tramadol Itching and Rash  . Atorvastatin Nausea And Vomiting  . Claritin [Loratadine] Other (See Comments)    Hot flashes  .  Dexamethasone Itching  . Prednisone Rash   Current Meds  Medication Sig  . acetaminophen (TYLENOL) 500 MG tablet Take 1,000 mg by mouth daily as needed for headache (pain).   Marland Kitchen aspirin EC 81 MG tablet Take 81 mg by mouth daily.  . Cholecalciferol (VITAMIN D) 2000 units tablet Take 2,000 Units by mouth daily.  . clopidogrel (PLAVIX) 75 MG tablet TAKE 1 TABLET BY MOUTH EVERY DAY  . dicyclomine (BENTYL) 10 MG capsule TAKE 1 CAPSULE BY MOUTH 3 TIMES A DAY BEFORE MEALS  . DULoxetine (CYMBALTA) 20 MG capsule TAKE 2-3 CAPSULES BY MOUTH EVERY DAY  . fluticasone (FLONASE) 50 MCG/ACT nasal spray Place 1 spray into both nostrils daily.  Marland Kitchen gabapentin  (NEURONTIN) 600 MG tablet TAKE 1 TABLET BY MOUTH THREE TIMES DAILY GENERIC EQUIVALENT FOR NEURONTIN  . KLOR-CON M20 20 MEQ tablet TAKE 1 TABLET BY MOUTH EVERY DAY  . linaclotide (LINZESS) 72 MCG capsule Take 1 capsule (72 mcg total) by mouth daily before breakfast.  . meclizine (ANTIVERT) 25 MG tablet Take 1 tablet (25 mg total) by mouth 3 (three) times daily as needed for dizziness.  . metoprolol succinate (TOPROL-XL) 25 MG 24 hr tablet TAKE 1 TABLET BY MOUTH TWICE A DAY  . nystatin-triamcinolone (MYCOLOG II) cream Apply 1 application topically 2 (two) times daily as needed (dry skin).   . pantoprazole (PROTONIX) 40 MG tablet TAKE 1 TABLET BY MOUTH EVERY DAY  . rosuvastatin (CRESTOR) 40 MG tablet TAKE 1 TABLET BY MOUTH EVERY DAY  . tiZANidine (ZANAFLEX) 4 MG tablet at bedtime.  . vitamin B-12 1000 MCG tablet Take 1 tablet (1,000 mcg total) by mouth daily.  . [DISCONTINUED] acyclovir ointment (ZOVIRAX) 5 % Apply 1 application topically every 3 (three) hours as needed (fever blisters).  . [DISCONTINUED] levothyroxine (SYNTHROID) 75 MCG tablet Take 1 tablet (75 mcg total) by mouth daily. (Patient taking differently: Take 75 mcg by mouth. Take 1 tablet daily by mouth Monday-Friday, then 1/2 Saturday and Sunday)  . [DISCONTINUED] meclizine (ANTIVERT) 12.5 MG tablet Take 1-2 tablets (12.5-25 mg total) by mouth 3 (three) times daily as needed for dizziness.   Social History   Tobacco Use  . Smoking status: Former Smoker    Packs/day: 0.50    Types: Cigarettes    Quit date: 10/07/2014    Years since quitting: 6.5  . Smokeless tobacco: Never Used  Substance Use Topics  . Alcohol use: No    Alcohol/week: 0.0 standard drinks   Family History  Problem Relation Age of Onset  . Arthritis Mother   . Hyperlipidemia Mother   . Heart disease Mother   . Hypertension Mother   . Stroke Mother 70  . Irritable bowel syndrome Mother   . Thyroid disease Mother   . Heart attack Mother   . Heart disease  Father   . Hyperlipidemia Father   . Hypertension Father   . Stroke Father 46  . Thyroid disease Father   . Prostate cancer Father   . Heart attack Father   . Lung cancer Brother 22  . Heart attack Maternal Grandfather   . Heart attack Maternal Uncle   . Colon cancer Neg Hx   . Rectal cancer Neg Hx   . Stomach cancer Neg Hx   . Esophageal cancer Neg Hx      Review of Systems  CBC:  Lab Results  Component Value Date   WBC 6.3 04/07/2021   HGB 12.4 04/07/2021   HGB 11.9 11/08/2019  HGB 10.6 (L) 11/28/2013   HCT 36.7 04/07/2021   HCT 35.7 11/08/2019   HCT 31.2 (L) 11/28/2013   MCH 33.2 (H) 10/07/2020   MCHC 33.7 04/07/2021   RDW 12.9 04/07/2021   RDW 11.8 11/08/2019   RDW 15.4 (H) 11/28/2013   PLT 250.0 04/07/2021   PLT 250 11/08/2019   MPV 10.1 10/07/2020   CMP: Lab Results  Component Value Date   NA 137 04/07/2021   NA 135 11/08/2019   NA 139 04/22/2014   K 4.3 04/07/2021   K 4.1 04/22/2014   CL 104 04/07/2021   CO2 26 04/07/2021   CO2 23 04/22/2014   ANIONGAP 11 04/08/2020   GLUCOSE 86 04/07/2021   GLUCOSE 81 04/22/2014   BUN 10 04/07/2021   BUN 18 11/08/2019   BUN 8.8 04/22/2014   CREATININE 0.90 04/07/2021   CREATININE 0.8 04/22/2014   LABGLOB 2.4 05/01/2019   GFRAA >60 04/08/2020   CALCIUM 8.9 04/07/2021   CALCIUM 9.1 04/22/2014   PROT 6.7 04/07/2021   PROT 6.9 05/01/2019   PROT 6.2 (L) 11/28/2013   AGRATIO 1.9 05/01/2019   BILITOT 0.3 04/07/2021   BILITOT <0.2 05/01/2019   BILITOT 0.27 11/28/2013   ALKPHOS 90 04/07/2021   ALKPHOS 80 11/28/2013   ALT 8 04/07/2021   ALT 9 11/28/2013   AST 13 04/07/2021   AST 11 11/28/2013   LIPID: Lab Results  Component Value Date   CHOL 164 04/07/2021   CHOL 304 (H) 02/19/2020   TRIG 246.0 (H) 04/07/2021   HDL 39.60 04/07/2021   HDL 29 (L) 02/19/2020   LDLCALC 83 04/08/2020   LDLCALC 200 (H) 02/19/2020   LABVLDL 75 (H) 02/19/2020    Objective:  BP 118/70 (BP Location: Left Arm, Patient  Position: Sitting, Cuff Size: Normal)   Pulse (!) 59   Temp 98.5 F (36.9 C) (Oral)   Ht 5\' 3"  (1.6 m)   Wt 113 lb 4.8 oz (51.4 kg)   SpO2 96%   BMI 20.07 kg/m   Weight: 113 lb 4.8 oz (51.4 kg)   BP Readings from Last 3 Encounters:  04/07/21 118/70  02/24/21 102/60  02/24/21 98/60   Wt Readings from Last 3 Encounters:  04/07/21 113 lb 4.8 oz (51.4 kg)  02/24/21 112 lb 6.4 oz (51 kg)  02/24/21 110 lb (49.9 kg)    Physical Exam Constitutional:      General: She is not in acute distress.    Appearance: She is well-developed.  HENT:     Head: Normocephalic and atraumatic.     Right Ear: External ear normal.     Left Ear: External ear normal.     Mouth/Throat:     Pharynx: No oropharyngeal exudate.  Eyes:     Conjunctiva/sclera: Conjunctivae normal.     Pupils: Pupils are equal, round, and reactive to light.  Neck:     Thyroid: No thyromegaly.  Cardiovascular:     Rate and Rhythm: Normal rate and regular rhythm.     Heart sounds: Normal heart sounds. No murmur heard. No friction rub. No gallop.   Pulmonary:     Effort: Pulmonary effort is normal.     Breath sounds: Normal breath sounds.  Chest:  Breasts: Breasts are symmetrical.     Right: Normal. No axillary adenopathy or supraclavicular adenopathy.     Left: Normal. No axillary adenopathy or supraclavicular adenopathy.    Abdominal:     General: Bowel sounds are normal. There is no distension.  Palpations: Abdomen is soft. There is no mass.     Tenderness: There is no abdominal tenderness. There is no guarding.     Hernia: No hernia is present.  Musculoskeletal:        General: No tenderness or deformity. Normal range of motion.     Cervical back: Normal range of motion and neck supple.  Lymphadenopathy:     Cervical: No cervical adenopathy.     Upper Body:     Right upper body: No supraclavicular, axillary or pectoral adenopathy.     Left upper body: No supraclavicular, axillary or pectoral adenopathy.   Skin:    General: Skin is warm and dry.     Findings: No rash.  Neurological:     Mental Status: She is alert and oriented to person, place, and time.     Deep Tendon Reflexes: Reflexes normal.     Reflex Scores:      Tricep reflexes are 2+ on the right side and 2+ on the left side.      Bicep reflexes are 2+ on the right side and 2+ on the left side.      Brachioradialis reflexes are 2+ on the right side and 2+ on the left side.      Patellar reflexes are 2+ on the right side and 2+ on the left side. Psychiatric:        Speech: Speech normal.        Behavior: Behavior normal.        Thought Content: Thought content normal.     Assessment/Plan: Health Maintenance Due  Topic Date Due  . HEMOGLOBIN A1C  08/14/2020  . URINE MICROALBUMIN  02/11/2021   Health Maintenance reviewed -mammogram order is still present; encouraged her to complete this. There is not more convenient location for her to go to unfortunately. I encouraged her to check with gyn to see if they could complete there; that way she could do all in one visit.  1. Preventative health care Keep up with healthy lifestyle.   2. Coronary artery disease involving native coronary artery of native heart without angina pectoris Toprol 25 mg daily, Crestor 40 mg daily.  Follows with cardiology regularly. - CBC with Differential/Platelet; Future - CBC with Differential/Platelet  3. Peripheral vascular disease ([place]) Follows regularly with cardiology.  4. GERD without esophagitis Continue with Protonix 40 mg.  She does follow regularly with GI.  5. Hyperlipidemia, unspecified hyperlipidemia type We will forward lab results to cardiology.  Currently on Crestor 40 mg daily. - Comprehensive metabolic panel; Future - Lipid panel; Future - Lipid panel - Comprehensive metabolic panel  6. Elevated glucose Recheck on monitor.  7. Hypothyroidism, unspecified type Synthroid 75 mcg daily.  Recheck labs. - TSH; Future -  TSH - TSH - TSH  8. Vertigo She does not want to physical therapy for this.  Just does not want to take time away from her elderly parents.  She does not wish to further evaluate at this time.  She does feel that symptoms are better controlled with the 5 mg of meclizine (rather than 12.5).  This prescription was sent in for her.  We discussed taking time with position changes to also help decrease the symptoms.  9. Iron deficiency Recheck blood work today.  Return in about 6 months (around 10/08/2021) for Chronic condition visit.  Micheline Rough, MD

## 2021-04-08 ENCOUNTER — Other Ambulatory Visit: Payer: Self-pay | Admitting: Family Medicine

## 2021-04-08 NOTE — Telephone Encounter (Signed)
Follow up:    Patient returning a call back for some medication changes and patient states she has  another apt today.

## 2021-04-23 ENCOUNTER — Telehealth: Payer: Self-pay

## 2021-04-23 NOTE — Telephone Encounter (Signed)
-----   Message from Liliane Shi, [place] sent at 04/08/2021  9:09 AM EDT ----- Reviewed labs from PCP. LDL near goal.  Would continue current dose of Rosuvastatin. Triglycerides are high.  There was a trial that indicated benefit in reducing CV events with taking Vascepa (REDUCE-IT) to lower triglycerides.  I would like to try her on this if she is willing to take it. The goal for triglycerides is < 150.   PLAN:  Vascepa 2 gm twice daily  Repeat fasting Lipids and LFTs in 3-4 mos. Richardson Dopp, PA-C    04/08/2021 9:08 AM

## 2021-04-26 ENCOUNTER — Encounter: Payer: Self-pay | Admitting: *Deleted

## 2021-04-28 ENCOUNTER — Other Ambulatory Visit: Payer: Self-pay | Admitting: Family Medicine

## 2021-04-30 ENCOUNTER — Other Ambulatory Visit: Payer: Self-pay | Admitting: Physician Assistant

## 2021-04-30 ENCOUNTER — Other Ambulatory Visit: Payer: Self-pay | Admitting: *Deleted

## 2021-04-30 DIAGNOSIS — E785 Hyperlipidemia, unspecified: Secondary | ICD-10-CM

## 2021-04-30 MED ORDER — ICOSAPENT ETHYL 1 G PO CAPS: 2.0000 g | ORAL_CAPSULE | Freq: Two times a day (BID) | ORAL | 3 refills | Status: DC

## 2021-05-05 ENCOUNTER — Other Ambulatory Visit: Payer: Self-pay | Admitting: Gastroenterology

## 2021-05-05 DIAGNOSIS — R112 Nausea with vomiting, unspecified: Secondary | ICD-10-CM

## 2021-05-05 DIAGNOSIS — K2970 Gastritis, unspecified, without bleeding: Secondary | ICD-10-CM

## 2021-05-27 ENCOUNTER — Telehealth: Payer: Self-pay

## 2021-05-27 NOTE — Telephone Encounter (Signed)
**Note De-Identified [name] Obfuscation** I started a [employer] PA through covermymeds. Key: [id number]

## 2021-05-31 NOTE — Telephone Encounter (Addendum)
**Note De-Identified Vishwa Dais Obfuscation** Letter/[employer]cos[employer] Animal nutritionist) PA form received from [employer]. I have completed the form and emailed it to [employer] so she can fax to [employer] at the fax number written on the cover letter included. It does not require an MDs signature and can be faxed as is.

## 2021-06-03 NOTE — Telephone Encounter (Signed)
**Note De-Identified [name]** Letter received Erin [name] fax from [facility] stating that they have approved the pts Icosapent PA for coverage from 06/02/21-06/01/22. Prior Authorization reference #: [id number]  I have notified [facility] in [place], [place] of this approval.

## 2021-06-22 ENCOUNTER — Other Ambulatory Visit: Payer: Self-pay | Admitting: Gastroenterology

## 2021-06-22 DIAGNOSIS — R112 Nausea with vomiting, unspecified: Secondary | ICD-10-CM

## 2021-06-22 DIAGNOSIS — K2970 Gastritis, unspecified, without bleeding: Secondary | ICD-10-CM

## 2021-07-02 ENCOUNTER — Encounter: Payer: Self-pay | Admitting: Gastroenterology

## 2021-07-18 ENCOUNTER — Other Ambulatory Visit: Payer: Self-pay | Admitting: Family Medicine

## 2021-08-11 ENCOUNTER — Other Ambulatory Visit: Payer: 59

## 2021-08-25 ENCOUNTER — Telehealth: Payer: Self-pay

## 2021-08-25 MED ORDER — GABAPENTIN 600 MG PO TABS: ORAL_TABLET | ORAL | 1 refills | Status: DC

## 2021-08-25 NOTE — Addendum Note (Signed)
Addended by: Agnes Lawrence on: 08/25/2021 03:01 PM   Modules accepted: Orders

## 2021-08-25 NOTE — Telephone Encounter (Signed)
Rx done. 

## 2021-08-25 NOTE — Telephone Encounter (Signed)
Patient call requesting Rx refill gabapentin (NEURONTIN) 600 MG tablet Sent to [facility] [place] [place]

## 2021-09-05 ENCOUNTER — Other Ambulatory Visit: Payer: Self-pay | Admitting: Gastroenterology

## 2021-09-05 ENCOUNTER — Other Ambulatory Visit: Payer: Self-pay | Admitting: Family Medicine

## 2021-09-05 DIAGNOSIS — K2970 Gastritis, unspecified, without bleeding: Secondary | ICD-10-CM

## 2021-09-05 DIAGNOSIS — R112 Nausea with vomiting, unspecified: Secondary | ICD-10-CM

## 2021-10-06 ENCOUNTER — Other Ambulatory Visit: Payer: 59

## 2021-10-13 ENCOUNTER — Ambulatory Visit: Payer: 59 | Admitting: Family Medicine

## 2021-11-08 ENCOUNTER — Ambulatory Visit: Payer: Self-pay | Admitting: Family Medicine

## 2021-11-08 NOTE — Progress Notes (Deleted)
°  Erin [name] DOB: Apr 01, 1956 Encounter date: 11/08/2021  This is a 65 y.o. female who presents with No chief complaint on file.   History of present illness:   *colonoscopy suggested repeat this year Follows regularly with GI/oncology for hx of anal cancer.  Protonix 40 mg daily for GERD.  CAD/cardiomyopathy/PVD/atherosclerosis aorta - follows regularly with vascular, Dr. Donnetta Hutching.  On Crestor 40 mg, lisinopril 2 g twice daily, Toprol 25 mg daily, Plavix 75 mg daily.  History of hypokalemia: Takes potassium regularly.  Did not tolerate ACE/ARB.  Neuropathy/B12 deficiency: Cymbalta, B12 supplementation.  Follows with neurology.  Hypothyroid: Synthroid 75 mcg daily.  Vertigo: Meclizine as needed.  ***DEXA  HPI   Allergies  Allergen Reactions   Amitiza [Lubiprostone] Diarrhea    Uncontrollable diarrhea   Nortriptyline Other (See Comments)    Stomach distention   Sulfa Antibiotics Nausea And Vomiting and Other (See Comments)    GI distress/pain   Tramadol Itching and Rash   Atorvastatin Nausea And Vomiting   Claritin [Loratadine] Other (See Comments)    Hot flashes   Dexamethasone Itching   Prednisone Rash   No outpatient medications have been marked as taking for the 11/08/21 encounter (Appointment) with [name], MD.    Review of Systems  Objective:  There were no vitals taken for this visit.      BP Readings from Last 3 Encounters:  04/07/21 118/70  02/24/21 102/60  02/24/21 98/60   Wt Readings from Last 3 Encounters:  04/07/21 113 lb 4.8 oz (51.4 kg)  02/24/21 112 lb 6.4 oz (51 kg)  02/24/21 110 lb (49.9 kg)    Physical Exam  Assessment/Plan  1. Atherosclerosis of aorta (HCC) ***        Micheline Rough, MD

## 2021-11-16 ENCOUNTER — Other Ambulatory Visit: Payer: Self-pay | Admitting: Family Medicine

## 2021-12-10 ENCOUNTER — Ambulatory Visit (INDEPENDENT_AMBULATORY_CARE_PROVIDER_SITE_OTHER): Payer: Medicare Other | Admitting: Family Medicine

## 2021-12-10 ENCOUNTER — Encounter: Payer: Self-pay | Admitting: Family Medicine

## 2021-12-10 VITALS — BP 100/60 | HR 58 | Temp 98.5°F | Ht 63.0 in | Wt 119.2 lb

## 2021-12-10 DIAGNOSIS — E2839 Other primary ovarian failure: Secondary | ICD-10-CM

## 2021-12-10 DIAGNOSIS — R252 Cramp and spasm: Secondary | ICD-10-CM | POA: Diagnosis not present

## 2021-12-10 DIAGNOSIS — R7309 Other abnormal glucose: Secondary | ICD-10-CM | POA: Diagnosis not present

## 2021-12-10 DIAGNOSIS — D519 Vitamin B12 deficiency anemia, unspecified: Secondary | ICD-10-CM

## 2021-12-10 DIAGNOSIS — Z1231 Encounter for screening mammogram for malignant neoplasm of breast: Secondary | ICD-10-CM | POA: Diagnosis not present

## 2021-12-10 DIAGNOSIS — Z23 Encounter for immunization: Secondary | ICD-10-CM

## 2021-12-10 DIAGNOSIS — E039 Hypothyroidism, unspecified: Secondary | ICD-10-CM | POA: Diagnosis not present

## 2021-12-10 DIAGNOSIS — E611 Iron deficiency: Secondary | ICD-10-CM | POA: Diagnosis not present

## 2021-12-10 DIAGNOSIS — K219 Gastro-esophageal reflux disease without esophagitis: Secondary | ICD-10-CM

## 2021-12-10 DIAGNOSIS — I2510 Atherosclerotic heart disease of native coronary artery without angina pectoris: Secondary | ICD-10-CM | POA: Diagnosis not present

## 2021-12-10 DIAGNOSIS — E785 Hyperlipidemia, unspecified: Secondary | ICD-10-CM | POA: Diagnosis not present

## 2021-12-10 DIAGNOSIS — I739 Peripheral vascular disease, unspecified: Secondary | ICD-10-CM | POA: Diagnosis not present

## 2021-12-10 MED ORDER — VALACYCLOVIR HCL 1 G PO TABS: 1000.0000 mg | ORAL_TABLET | Freq: Three times a day (TID) | ORAL | 0 refills | Status: AC

## 2021-12-10 NOTE — Progress Notes (Signed)
Erin [name] DOB: 1956-10-20 Encounter date: 12/10/2021  This is a 66 y.o. female who presents with Chief Complaint  Patient presents with   Follow-up    History of present illness:  She is having some intermittent cramping. Knows lack of exercise contributes. Tends to get right in quad up into abd.   She has had a couple of falls in the last year. Some related to vertigo. Getting up quickly at night to help with parents if they need her. When she gets up too fast to tend to one of them, then she falls. Just bruises, no head trauma.   She isn't happy about weight gain.   Wants ears checked. Was cleaning one day and states they were bleeding. Had been hurting. Right bleed more than left. Happened about October. Sometimes they will still hurt. Sometimes popping. Do itch sometimes.   Has pain right posterior shoulder - superficial- started like itching; feels cold; feels like shingles, but no blisters. Runs under armpit from back to front. Happening since 9th. Ibuprofen helps with pain.neurontin also helps. Feels sore like deep inside, but also superficial. No blisters; but worries about shingles.   Hyperglycemia/borderline: -dx in 2014, well controlled -no meds   Hypothyroidism: -meds: levothyroxine 78mcg daily   IBS/GERD/Chronic Mesenteric Ischemia(see below)/hx of anal cancer: On 4/13 she underwent balloon angioplasty of SMA and celiac artery for chronic mesenteric ischemia.  She did develop a hematoma at her left brachial arterial cath site. -sees GI (Dr. Fuller Plan) -meds: linzess and bentyl - takes the bentyl TID which helps relax stomach. Rarely taking the linzess because it is too strong. When she passes gas it smells like sulfer. Going to bathroom regularly without linzess - takes bentyl 4times per day which has been keeping her regular.   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 early for this, underwent stenting with Dr. Trula Slade in 06/2018   Hypokalemia: -hx of -takes potassium as directed -did not tol acei/arb    Depression/underweight/neuropathy/pain: -long hx neuropathy, sees neurology -had longstanding pain after vascular surgery -cymbalta helped all of the above -takes b12 for neuropathy, b12 def   Last Pap was done 09/12/2016 with negative HPV normal cells.  Some limitation resolved secondary to atrophy.  This is through [facility].   ?  Mammogram: she worries about scheduling of both colonoscopy and mammogram due to time away from parents. She has not had a bone density study.   Allergies  Allergen Reactions   Amitiza [Lubiprostone] Diarrhea    Uncontrollable diarrhea   Nortriptyline Other (See Comments)    Stomach distention   Sulfa Antibiotics Nausea And Vomiting and Other (See Comments)    GI distress/pain   Tramadol Itching and Rash   Atorvastatin Nausea And Vomiting   Claritin [Loratadine] Other (See Comments)    Hot flashes   Dexamethasone Itching   Prednisone Rash   Current Meds  Medication Sig   acetaminophen (TYLENOL) 500 MG tablet Take 1,000 mg by mouth daily as needed for headache (pain).    acyclovir ointment (ZOVIRAX) 5 % Apply 1 application topically every 3 (three) hours as needed (fever blisters).   aspirin EC 81 MG tablet Take 81 mg by mouth daily.   Cholecalciferol (VITAMIN D) 2000 units tablet Take 2,000 Units by mouth  daily.   clopidogrel (PLAVIX) 75 MG tablet TAKE 1 TABLET BY MOUTH EVERY DAY   dicyclomine (BENTYL) 10 MG capsule TAKE 1 CAPSULE BY MOUTH THREE TIMES A DAY BEFORE MEALS   DULoxetine (CYMBALTA) 20 MG capsule TAKE 2-3 CAPSULES BY MOUTH EVERY DAY   fluticasone (FLONASE) 50 MCG/ACT nasal spray Place 1 spray into both nostrils daily.   gabapentin (NEURONTIN)  600 MG tablet TAKE 1 TABLET BY MOUTH THREE TIMES DAILY GENERIC EQUIVALENT FOR NEURONTIN   icosapent Ethyl (VASCEPA) 1 g capsule Take 2 capsules (2 g total) by mouth 2 (two) times daily.   KLOR-CON M20 20 MEQ tablet TAKE 1 TABLET BY MOUTH EVERY DAY   levothyroxine (SYNTHROID) 75 MCG tablet Take 1 tablet (75 mcg total) by mouth daily before breakfast. Take 1 tablet daily by mouth Monday-Friday, then 1/2 Saturday and Sunday   linaclotide (LINZESS) 72 MCG capsule Take 1 capsule (72 mcg total) by mouth daily before breakfast.   meclizine (ANTIVERT) 25 MG tablet Take 1 tablet (25 mg total) by mouth 3 (three) times daily as needed for dizziness.   metoprolol succinate (TOPROL-XL) 25 MG 24 hr tablet TAKE 1 TABLET BY MOUTH TWICE A DAY   nystatin-triamcinolone (MYCOLOG II) cream Apply 1 application topically 2 (two) times daily as needed (dry skin).    pantoprazole (PROTONIX) 40 MG tablet TAKE 1 TABLET BY MOUTH EVERY DAY   rosuvastatin (CRESTOR) 40 MG tablet TAKE 1 TABLET BY MOUTH EVERY DAY   tiZANidine (ZANAFLEX) 4 MG tablet at bedtime.   valACYclovir (VALTREX) 1000 MG tablet Take 1 tablet (1,000 mg total) by mouth 3 (three) times daily for 8 days.   vitamin B-12 1000 MCG tablet Take 1 tablet (1,000 mcg total) by mouth daily.    Review of Systems  Constitutional:  Negative for chills, fatigue and fever.  Respiratory:  Negative for cough, chest tightness, shortness of breath and wheezing.   Cardiovascular:  Negative for chest pain, palpitations and leg swelling.  Musculoskeletal:  Positive for myalgias (muscle cramps bilat legs R>L intermittent).   Objective:  BP 100/60 (BP Location: Left Arm, Patient Position: Sitting, Cuff Size: Normal)    Pulse (!) 58    Temp 98.5 F (36.9 C) (Oral)    Ht 5\' 3"  (1.6 m)    Wt 119 lb 3.2 oz (54.1 kg)    SpO2 96%    BMI 21.12 kg/m   Weight: 119 lb 3.2 oz (54.1 kg)   BP Readings from Last 3 Encounters:  12/10/21 100/60  04/07/21 118/70  02/24/21 102/60   Wt  Readings from Last 3 Encounters:  12/10/21 119 lb 3.2 oz (54.1 kg)  04/07/21 113 lb 4.8 oz (51.4 kg)  02/24/21 112 lb 6.4 oz (51 kg)    Physical Exam Constitutional:      General: She is not in acute distress.    Appearance: She is well-developed.  Cardiovascular:     Rate and Rhythm: Normal rate and regular rhythm.     Heart sounds: Normal heart sounds. No murmur heard.   No friction rub.  Pulmonary:     Effort: Pulmonary effort is normal. No respiratory distress.     Breath sounds: Normal breath sounds. No wheezing or rales.  Abdominal:     Tenderness: There is no abdominal tenderness.  Musculoskeletal:     Right lower leg: No edema.     Left lower leg: No edema.  Skin:    Comments: Hyperesthesia right axillary dermatome. No apparent skin rash(there are  two areas that appear just very slightly erythematous, but it is extremely subtle.   Neurological:     Mental Status: She is alert and oriented to person, place, and time.  Psychiatric:        Behavior: Behavior normal.    Assessment/Plan 1. Muscle cramps Rechecking blood work.  Wonder if some of her muscle cramps may be due to electrolyte or vitamin deficiencies.  Follow-up pending these results. - Magnesium; Future - Magnesium  2. Low iron Checking blood work.  3. Coronary artery disease involving native coronary artery of native heart without angina pectoris She continues with Crestor 40 mg daily.  Blood pressure is well controlled.  She does follow with cardiology.  4. PAD (peripheral artery disease) (HCC) Stable status post revascularization.  Blood pressures well controlled.  She continues on Plavix.  She does follow with vascular.  5. GERD without esophagitis Continues with Protonix 40 mg daily.  Follows regularly with GI.  She is overdue for colonoscopy, but has hesitated to schedule due to caring for her elderly parents.  Stressed importance of scheduling this today.  6. Hypothyroidism, unspecified  type Recheck blood work.  Continue with Synthroid 75 mcg daily.  She takes a full tablet Monday through Friday and 1/2 tablet on Saturday and Sunday - TSH; Future - TSH  7. Anemia due to vitamin B12 deficiency, unspecified B12 deficiency type - CBC with Differential/Platelets; Future - Folate; Future - Vitamin B12; Future - Vitamin B12 - Folate - CBC with Differential/Platelets  8. Elevated glucose - Hemoglobin A1c; Future - Hemoglobin A1c  9. Hyperlipidemia, unspecified hyperlipidemia type Crestor 40 mg - CMP; Future - Lipid Panel; Future - Lipid Panel - CMP  10. Encounter for screening mammogram for malignant neoplasm of breast Encouraged patient to schedule both mammogram and bone density at the same time.  We discussed that this procedure will not take very much time. - MM Digital Screening; Future  11. Estrogen deficiency - DG Bone Density; Future  12. Need for immunization against influenza - Flu Vaccine QUAD High Dose(Fluad)  13. Need for prophylactic vaccination against Streptococcus pneumoniae (pneumococcus) - Pneumococcal conjugate vaccine 20-valent (Prevnar 20)   Return in about 6 months (around 06/09/2022) for physical exam.     Micheline Rough, MD

## 2021-12-11 LAB — HEMOGLOBIN A1C
Hgb A1c MFr Bld: 5.2 % of total Hgb (ref <5.7)
Mean Plasma Glucose: 103 mg/dL
eAG (mmol/L): 5.7 mmol/L

## 2021-12-11 LAB — LIPID PANEL
Cholesterol: 156 mg/dL (ref <200)
HDL: 40 mg/dL — ABNORMAL LOW (ref >=50)
LDL Cholesterol (Calc): 78 mg/dL (calc)
Non-HDL Cholesterol (Calc): 116 mg/dL (calc) (ref <130)
Total CHOL/HDL Ratio: 3.9 (calc) (ref <5.0)
Triglycerides: 291 mg/dL — ABNORMAL HIGH (ref <150)

## 2021-12-11 LAB — COMPREHENSIVE METABOLIC PANEL
AG Ratio: 1.9 (calc) (ref 1.0–2.5)
ALT: 7 U/L (ref 6–29)
AST: 15 U/L (ref 10–35)
Albumin: 4.6 g/dL (ref 3.6–5.1)
Alkaline phosphatase (APISO): 83 U/L (ref 37–153)
BUN: 12 mg/dL (ref 7–25)
CO2: 28 mmol/L (ref 20–32)
Calcium: 9.3 mg/dL (ref 8.6–10.4)
Chloride: 103 mmol/L (ref 98–110)
Creat: 0.94 mg/dL (ref 0.50–1.05)
Globulin: 2.4 g/dL (calc) (ref 1.9–3.7)
Glucose, Bld: 88 mg/dL (ref 65–99)
Potassium: 4 mmol/L (ref 3.5–5.3)
Sodium: 138 mmol/L (ref 135–146)
Total Bilirubin: 0.3 mg/dL (ref 0.2–1.2)
Total Protein: 7 g/dL (ref 6.1–8.1)

## 2021-12-11 LAB — CBC WITH DIFFERENTIAL/PLATELET
Absolute Monocytes: 843 cells/uL (ref 200–950)
Basophils Absolute: 17 cells/uL (ref 0–200)
Basophils Relative: 0.2 %
Eosinophils Absolute: 60 cells/uL (ref 15–500)
Eosinophils Relative: 0.7 %
HCT: 36.8 % (ref 35.0–45.0)
Hemoglobin: 12.3 g/dL (ref 11.7–15.5)
Lymphs Abs: 1247 cells/uL (ref 850–3900)
MCH: 32.9 pg (ref 27.0–33.0)
MCHC: 33.4 g/dL (ref 32.0–36.0)
MCV: 98.4 fL (ref 80.0–100.0)
MPV: 10.1 fL (ref 7.5–12.5)
Monocytes Relative: 9.8 %
Neutro Abs: 6433 cells/uL (ref 1500–7800)
Neutrophils Relative %: 74.8 %
Platelets: 249 10*3/uL (ref 140–400)
RBC: 3.74 10*6/uL — ABNORMAL LOW (ref 3.80–5.10)
RDW: 11.8 % (ref 11.0–15.0)
Total Lymphocyte: 14.5 %
WBC: 8.6 10*3/uL (ref 3.8–10.8)

## 2021-12-11 LAB — FOLATE: Folate: 14.2 ng/mL

## 2021-12-11 LAB — VITAMIN B12: Vitamin B-12: 651 pg/mL (ref 200–1100)

## 2021-12-11 LAB — MAGNESIUM: Magnesium: 2.1 mg/dL (ref 1.5–2.5)

## 2021-12-11 LAB — TSH: TSH: 0.76 mIU/L (ref 0.40–4.50)

## 2021-12-24 ENCOUNTER — Telehealth: Payer: Self-pay | Admitting: Physician Assistant

## 2021-12-24 DIAGNOSIS — E785 Hyperlipidemia, unspecified: Secondary | ICD-10-CM

## 2021-12-24 MED ORDER — EZETIMIBE 10 MG PO TABS
10.0000 mg | ORAL_TABLET | Freq: Every day | ORAL | 3 refills | Status: DC
Start: 2021-12-24 — End: 2022-04-27

## 2021-12-24 NOTE — Telephone Encounter (Signed)
° °  Pt is returning call to get lab result °

## 2021-12-24 NOTE — Addendum Note (Signed)
Addended by: Aris Georgia, Ellagrace Yoshida L on: 12/24/2021 04:32 PM   Modules accepted: Orders

## 2021-12-24 NOTE — Telephone Encounter (Signed)
Liliane Shi, PA-C  12/13/2021  7:26 AM EST     LDL and Triglycerides still above goal.  LFTs normal.  PLAN:  - Start Ezetimibe (Zetia) 10 mg once daily  - Fasting Lipids and LFTs in 3 mos.  Richardson Dopp, PA-C    12/13/2021 7:25 AM      Called patient with results as written above. Ordered labs and patient will come in on 03/23/22. Zetia 10 mg sent to patient's pharmacy of choice.

## 2022-01-04 ENCOUNTER — Other Ambulatory Visit: Payer: Self-pay | Admitting: Physician Assistant

## 2022-01-04 ENCOUNTER — Other Ambulatory Visit: Payer: Self-pay | Admitting: Vascular Surgery

## 2022-01-04 ENCOUNTER — Other Ambulatory Visit: Payer: Self-pay | Admitting: Gastroenterology

## 2022-01-04 ENCOUNTER — Other Ambulatory Visit: Payer: Self-pay | Admitting: Family Medicine

## 2022-01-04 DIAGNOSIS — R112 Nausea with vomiting, unspecified: Secondary | ICD-10-CM

## 2022-01-04 DIAGNOSIS — K2970 Gastritis, unspecified, without bleeding: Secondary | ICD-10-CM

## 2022-01-19 ENCOUNTER — Other Ambulatory Visit: Payer: Self-pay | Admitting: Family Medicine

## 2022-01-19 DIAGNOSIS — Z1231 Encounter for screening mammogram for malignant neoplasm of breast: Secondary | ICD-10-CM

## 2022-01-26 ENCOUNTER — Telehealth: Payer: Self-pay | Admitting: Physician Assistant

## 2022-01-26 NOTE — Telephone Encounter (Signed)
? ?  Pre-operative Risk Assessment  ?  ?Patient Name: Erin [name]  ?DOB: [birth year] ?MRN: [id number]  ? ? ? ?Request for Surgical Clearance   ? ?Procedure:  extraction 1 tooth ? ?Date of Surgery:  Clearance  Today 01-26-22                              ?   ?Surgeon:  Dr Retta Mac ?Surgeon's Group or Practice Name:   ?Phone number:  [phone] ?Fax number:   ?  ?Type of Clearance Requested:   ?- Medical  ?- Pharmacy:  Hold Clopidogrel (Plavix)   ?  ?Type of Anesthesia:  Local  ?  ?Additional requests/questions:   ? ?Signed, ?Glyn Ade   ?01/26/2022, 3:41 PM  ? ?

## 2022-01-26 NOTE — Telephone Encounter (Signed)
Spoke with Dr. Sheria Lang office ? ?  ? ?Primary Cardiologist: [name], MD ? ?Chart reviewed as part of pre-operative protocol coverage. Simple dental extractions are considered low risk procedures per guidelines and generally do not require any specific cardiac clearance. It is also generally accepted that for simple extractions and dental cleanings, there is no need to interrupt blood thinner therapy.  ? ?SBE prophylaxis is not required for the patient. ? ?I will route this recommendation to the requesting party via Epic fax function and remove from pre-op pool. ? ?Please call with questions. ? ?Emmaline Life, NP-C ? ?  ?01/26/2022, 3:53 PM ?[facility] ?[address], [address] ?Office [phone] Fax [phone] ? ? ?

## 2022-02-08 ENCOUNTER — Ambulatory Visit: Payer: Medicare Other | Admitting: Gastroenterology

## 2022-02-11 ENCOUNTER — Inpatient Hospital Stay: Admission: RE | Admit: 2022-02-11 | Payer: Medicare Other | Source: Ambulatory Visit

## 2022-02-20 ENCOUNTER — Other Ambulatory Visit: Payer: Self-pay

## 2022-02-20 DIAGNOSIS — I7409 Other arterial embolism and thrombosis of abdominal aorta: Secondary | ICD-10-CM

## 2022-02-20 DIAGNOSIS — I739 Peripheral vascular disease, unspecified: Secondary | ICD-10-CM

## 2022-02-21 ENCOUNTER — Other Ambulatory Visit: Payer: Self-pay | Admitting: Gastroenterology

## 2022-02-21 ENCOUNTER — Other Ambulatory Visit: Payer: Self-pay | Admitting: Family Medicine

## 2022-02-21 ENCOUNTER — Other Ambulatory Visit: Payer: Self-pay | Admitting: Physician Assistant

## 2022-02-21 DIAGNOSIS — R112 Nausea with vomiting, unspecified: Secondary | ICD-10-CM

## 2022-02-21 DIAGNOSIS — K2970 Gastritis, unspecified, without bleeding: Secondary | ICD-10-CM

## 2022-02-28 ENCOUNTER — Encounter: Payer: Self-pay | Admitting: Physician Assistant

## 2022-02-28 ENCOUNTER — Ambulatory Visit: Payer: Medicare Other | Admitting: Physician Assistant

## 2022-02-28 ENCOUNTER — Ambulatory Visit (INDEPENDENT_AMBULATORY_CARE_PROVIDER_SITE_OTHER)
Admission: RE | Admit: 2022-02-28 | Discharge: 2022-02-28 | Disposition: A | Discharge status: Home / Self Care | Payer: Medicare Other | Source: Ambulatory Visit | Attending: Surgery | Admitting: Surgery

## 2022-02-28 ENCOUNTER — Ambulatory Visit (HOSPITAL_COMMUNITY)
Admission: RE | Admit: 2022-02-28 | Discharge: 2022-02-28 | Disposition: A | Discharge status: Home / Self Care | Payer: Medicare Other | Source: Ambulatory Visit | Attending: Surgery | Admitting: Surgery

## 2022-02-28 VITALS — BP 146/73 | HR 72 | Temp 97.1°F | Resp 14 | Ht 63.0 in | Wt 114.0 lb

## 2022-02-28 DIAGNOSIS — I739 Peripheral vascular disease, unspecified: Secondary | ICD-10-CM | POA: Insufficient documentation

## 2022-02-28 DIAGNOSIS — K551 Chronic vascular disorders of intestine: Secondary | ICD-10-CM

## 2022-02-28 DIAGNOSIS — I7409 Other arterial embolism and thrombosis of abdominal aorta: Secondary | ICD-10-CM | POA: Diagnosis not present

## 2022-02-28 NOTE — Progress Notes (Signed)
?Office Note  ? ? ? ?CC:  follow up ?Requesting Provider:  [name], MD ? ?HPI: Erin [name] is a 66 y.o. (13-Nov-1956) female who presents for surveillance of mesenteric artery stenosis as well as PAD.  She is well-known to [facility] with history of aortobifemoral bypass grafting and right femoral to above-the-knee popliteal bypass with vein by Dr. Donnetta Hutching on 08/14/2017.  She also has history of aortogram with angioplasty and stenting of her SMA and celiac arteries in July 2019 by Dr. Trula Slade.  She had recurrent stenosis of the stents and underwent repeat angiography and balloon angioplasty of SMA and celiac arteries by Dr. Trula Slade on 03/03/2020. ? ?Patient has been experiencing more cramping and bloating than usual of her abdomen.  She is worried it is related to her mesenteric artery stenosis.  She denies any significant postprandial pain, or weight loss.  She is on an aspirin, Plavix, and a statin daily.  She is a former smoker. ? ?She is also followed for PAD.  She denies any claudication, rest pain, or nonhealing wounds of bilateral lower extremities.  She has occasional pins-and-needles feeling around the incisions of her right leg however this is tolerable. ? ? ?Past Medical History:  ?Diagnosis Date  ? Allergy   ? Alopecia   ? Anal cancer ([place]) 08/14/13  ? invasive squamous cell ca, s/p radiation 10/20-11/26/14 60.4Gy/43f and chemo  ? Anxiety   ? 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 B12 deficiency anemia 09/14/2015  ? Blood transfusion without reported diagnosis   ? BPPV (benign paroxysmal positional vertigo) 07/08/2015  ? Cardiomyopathy ([facility] 11/03/2017  ? Chronic back pain   ? Chronic daily headache 03/29/2013  ? takes bc powder  ? Closed right hip fracture ([facility] 09/10/2015  ? Coronary artery disease involving native coronary artery of native heart without angina pectoris 10/13/2017  ? DES to mid RCA  ? Depression   ? Essential (hemorrhagic) thrombocythemia ([facility] 09/06/2018  ? Family history  of early CAD 04/08/2016  ? GERD (gastroesophageal reflux disease)   ? History of hiatal hernia   ? Hot flashes   ? Hyperlipidemia   ? Hyperlipidemia associated with type 2 diabetes mellitus ([facility] 05/11/2017  ? Hypothyroidism 09/10/2015  ? IBS (irritable bowel syndrome) 03/29/2013  ? Neck pain 07/08/2015  ? 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 takes neurotin  ? Peripheral vascular disease ([facility] 11/03/2017  ? QT prolongation   ? Sacroiliitis ([facility] 09/06/2018  ? Spasms of the hands or feet 10/08/2017  ? Syncope 06/07/2016  ? Tubular adenoma of colon 09/08/2003  ? Vertigo   ? Wears glasses   ? ? ?Past Surgical History:  ?Procedure Laterality Date  ? ABDOMINAL AORTOGRAM N/A 08/02/2017  ? Procedure: ABDOMINAL AORTOGRAM;  Surgeon: AWellington Hampshire MD;  [facility];  Service: Cardiovascular;  Laterality: N/A;  ? AORTA - BILATERAL FEMORAL ARTERY BYPASS GRAFT N/A 08/14/2017  ? Procedure: AORTA BIFEMORAL BYPASS GRAFT;  Surgeon: ERosetta Posner MD;  [facility];  Service: Vascular;  Laterality: N/A;  ? COLONOSCOPY    ? CORONARY STENT INTERVENTION N/A 10/13/2017  ? Procedure: CORONARY STENT INTERVENTION;  Surgeon: ENelva Bush MD;  [facility];  Service: Cardiovascular;  Laterality: N/A;  ? dental implant    ? ECTOPIC PREGNANCY SURGERY    ? EMBOLECTOMY N/A 08/14/2017  ? Procedure: EMBOLECTOMY FEMORAL;  Surgeon: ERosetta Posner MD;  [facility];  Service: Vascular;  Laterality: N/A;  ? FEMORAL-POPLITEAL BYPASS GRAFT  Right 08/14/2017  ? Procedure: Right Femoral to Above Knee Popliteal Bypass Graft using Non-Reversed Greater Saphenous Vein Graft from Right Leg;  Surgeon: Rosetta Posner, MD;  Location: [facility];  Service: Vascular;  Laterality: Right;  ? FLEXIBLE SIGMOIDOSCOPY N/A 08/14/2013  ? Procedure: FLEXIBLE SIGMOIDOSCOPY;  Surgeon: Ladene Artist, MD;  [facility];  Service: Endoscopy;  Laterality: N/A;  ? LEFT HEART CATH AND CORONARY ANGIOGRAPHY N/A 10/13/2017  ? Procedure: LEFT HEART CATH  AND CORONARY ANGIOGRAPHY;  Surgeon: Nelva Bush, MD;  Location: [facility];  Service: Cardiovascular;  Laterality: N/A;  ? LOWER EXTREMITY ANGIOGRAPHY Bilateral 08/02/2017  ? Procedure: Lower Extremity Angiography;  Surgeon: Wellington Hampshire, MD;  Location: [facility];  Service: Cardiovascular;  Laterality: Bilateral;  ? MULTIPLE TOOTH EXTRACTIONS    ? PERIPHERAL VASCULAR INTERVENTION  05/22/2018  ? Procedure: PERIPHERAL VASCULAR INTERVENTION;  Surgeon: Serafina Mitchell, MD;  Location: [facility];  Service: Cardiovascular;;  SMA and Celiac  ? PILONIDAL CYST EXCISION    ? POLYPECTOMY    ? THROMBECTOMY FEMORAL ARTERY Right 08/14/2017  ? Procedure: THROMBECTOMY FEMORAL ARTERY;  Surgeon: Rosetta Posner, MD;  Location: [facility];  Service: Vascular;  Laterality: Right;  ? TONSILLECTOMY    ? TOTAL HIP ARTHROPLASTY  09/11/2015  ? Procedure: TOTAL HIP ARTHROPLASTY;  Surgeon: Renette Butters, MD;  Location: [facility];  Service: Orthopedics;;  ? ULTRASOUND GUIDANCE FOR VASCULAR ACCESS  10/13/2017  ? Procedure: Ultrasound Guidance For Vascular Access;  Surgeon: Nelva Bush, MD;  Location: [facility];  Service: Cardiovascular;;  ? VISCERAL ANGIOGRAPHY N/A 03/03/2020  ? Procedure: MESTENRIC ANGIOGRAPHY;  Surgeon: Serafina Mitchell, MD;  Location: [facility];  Service: Cardiovascular;  Laterality: N/A;  ? ? ?Social History  ? ?Socioeconomic History  ? Marital status: Married  ?  Spouse name: Not on file  ? Number of children: 0  ? Years of education: Not on file  ? Highest education level: Not on file  ?Occupational History  ? Occupation: caregiver  ?Tobacco Use  ? Smoking status: Former  ?  Packs/day: 0.50  ?  Types: Cigarettes  ?  Quit date: 10/07/2014  ?  Years since quitting: 7.4  ? Smokeless tobacco: Never  ?Vaping Use  ? Vaping Use: Never used  ?Substance and Sexual Activity  ? Alcohol use: No  ?  Alcohol/week: 0.0 standard drinks  ? Drug use: No  ? Sexual activity: Yes  ?  Partners:  Male  ?Other Topics Concern  ? Not on file  ?Social History Narrative  ? Work or School: homemaker  ?   ? Home Situation: lives with her husband,[name] takes care of her elderly parents   ?   ? Spiritual Beliefs: Christian  ?   ? Lifestyle: no regular exercise, poor diet  ?   ? Right handed   ?   ?   ?   ? ?Social Determinants of Health  ? ?Financial Resource Strain: Not on file  ?Food Insecurity: Not on file  ?Transportation Needs: Not on file  ?Physical Activity: Not on file  ?Stress: Not on file  ?Social Connections: Not on file  ?Intimate Partner Violence: Not on file  ? ? ?Family History  ?Problem Relation Age of Onset  ? Arthritis Mother   ? Hyperlipidemia Mother   ? Heart disease Mother   ? Hypertension Mother   ? Stroke Mother 32  ? Irritable bowel syndrome Mother   ?  Thyroid disease Mother   ? Heart attack Mother   ? Heart disease Father   ? Hyperlipidemia Father   ? Hypertension Father   ? Stroke Father 22  ? Thyroid disease Father   ? Prostate cancer Father   ? Heart attack Father   ? Lung cancer Brother 51  ? Heart attack Maternal Grandfather   ? Heart attack Maternal Uncle   ? Colon cancer Neg Hx   ? Rectal cancer Neg Hx   ? Stomach cancer Neg Hx   ? Esophageal cancer Neg Hx   ? ? ?Current Outpatient Medications  ?Medication Sig Dispense Refill  ? acetaminophen (TYLENOL) 500 MG tablet Take 1,000 mg by mouth daily as needed for headache (pain).     ? acyclovir ointment (ZOVIRAX) 5 % Apply 1 application topically every 3 (three) hours as needed (fever blisters). 15 g 3  ? aspirin EC 81 MG tablet Take 81 mg by mouth daily.    ? Cholecalciferol (VITAMIN D) 2000 units tablet Take 2,000 Units by mouth daily.    ? clopidogrel (PLAVIX) 75 MG tablet TAKE 1 TABLET BY MOUTH EVERY DAY 90 tablet 3  ? dicyclomine (BENTYL) 10 MG capsule TAKE 1 CAPSULE BY MOUTH THREE TIMES A DAY BEFORE MEALS 270 capsule 0  ? DULoxetine (CYMBALTA) 20 MG capsule TAKE 2-3 CAPSULES BY MOUTH EVERY DAY 270 capsule 1  ? ezetimibe (ZETIA) 10  MG tablet Take 1 tablet (10 mg total) by mouth daily. 90 tablet 3  ? fluticasone (FLONASE) 50 MCG/ACT nasal spray Place 1 spray into both nostrils daily. 48 g 1  ? gabapentin (NEURONTIN) 600 MG tablet TA

## 2022-03-21 ENCOUNTER — Telehealth: Payer: Self-pay | Admitting: Cardiovascular Disease

## 2022-03-21 NOTE — Telephone Encounter (Signed)
Patient is requesting to switch providers from Dr. Burt Knack to Dr. Harl Bowie to due location convenience.  ? ?Please advise as able, ?Thank you ?

## 2022-03-23 ENCOUNTER — Other Ambulatory Visit: Payer: Medicare Other

## 2022-03-30 ENCOUNTER — Ambulatory Visit: Payer: Medicare Other | Admitting: Gastroenterology

## 2022-04-27 ENCOUNTER — Ambulatory Visit: Payer: Medicare Other | Admitting: Gastroenterology

## 2022-04-27 ENCOUNTER — Encounter: Payer: Self-pay | Admitting: Gastroenterology

## 2022-04-27 ENCOUNTER — Telehealth: Payer: Self-pay

## 2022-04-27 VITALS — BP 96/62 | HR 64 | Wt 119.1 lb

## 2022-04-27 DIAGNOSIS — K5904 Chronic idiopathic constipation: Secondary | ICD-10-CM

## 2022-04-27 DIAGNOSIS — Z86010 Personal history of colonic polyps: Secondary | ICD-10-CM

## 2022-04-27 DIAGNOSIS — R1310 Dysphagia, unspecified: Secondary | ICD-10-CM | POA: Diagnosis not present

## 2022-04-27 DIAGNOSIS — K219 Gastro-esophageal reflux disease without esophagitis: Secondary | ICD-10-CM | POA: Diagnosis not present

## 2022-04-27 MED ORDER — NA SULFATE-K SULFATE-MG SULF 17.5-3.13-1.6 GM/177ML PO SOLN: 1.0000 | Freq: Once | ORAL | 0 refills | Status: AC

## 2022-04-27 MED ORDER — PANTOPRAZOLE SODIUM 40 MG PO TBEC: 40.0000 mg | DELAYED_RELEASE_TABLET | Freq: Two times a day (BID) | ORAL | 11 refills | Status: DC

## 2022-04-27 NOTE — Progress Notes (Signed)
Assessment     Personal history of multiple adenomatous colon polyps Chronic mesenteric ischemia S/P SMA angioplasty Personal history of anal squamous cell carcinoma  Dark stools due to PeptoBismol Chronic idiopathic constipation Globus sensation  Dysphagia mainly with pills, some solids GERD, not adequately controlled  Recommendations    Discontinue PeptoBismol Discontinue Linzess Increase pantoprazole to 40 mg po bid and closely follow antireflux measures Miralax once or twice daily, not prn titrated for complete bowel movement daily Schedule MBSS and barium esophagram Schedule colonoscopy. The risks (including bleeding, perforation, infection, missed lesions, medication reactions and possible hospitalization or surgery if complications occur), benefits, and alternatives to colonoscopy with possible biopsy and possible polypectomy were discussed with the patient and they consent to proceed.   Hold Plavix 5 days before procedure - will instruct when and how to resume after procedure. Low but real risk of cardiovascular event such as heart attack, stroke, embolism, thrombosis or ischemia/infarct of other organs off Plavix explained and need to seek urgent help if this occurs. The patient consents to proceed. Will communicate by phone or EMR with patient's prescribing provider to confirm that holding Plavix is reasonable in this case.   ENT referral after MBSS and esophagram results reviewed   HPI    This is a 66 year old female with GERD, dysphagia, dark stools, constipation, abdominal pain, abdominal bloating.   She notes difficulty swallowing pills and occasionally difficulty swallowing solid foods.  She localizes the symptoms to her neck.  She notes an occasional tightness in her throat that is brought on by stress.  The symptoms have been present for several years and have not substantially changed.   She relates long-term difficulties with constipation that is not well  controlled. She relates its 3 to 4 days between bowel movements with abdominal bloating, abdominal pain and increased flatus.  She notes Linzess even at 72 mcg leads to significant diarrhea so she discontinued it.  She has tried MiraLAX in the past which has been effective however she has not taken MiraLAX recently.   Her reflux symptoms have been more active.  She feels omeprazole 40 mg was more effective than pantoprazole 40 mg.  She has been taking Pepto-Bismol to help her GERD symptoms. She notes that her stools have been dark, black since beginning Pepto-Bismol.   She has a history of adenomatous colon polyps and is overdue for surveillance colonoscopy.   She is maintained on Plavix.  Barium esophagram 10/2018 Prominent bilateral lateral pharyngeal pouches, left greater than right. Mild pharyngeal irregularity anteriorly of unknown etiology or significance. This could be further evaluated with endoscopy. Mild upper cervical esophageal web. Esophageal dysmotility.  EGD 10/2018 - The esophagus was normal. - The stomach was normal. - The examined duodenum was normal. - The cardia and gastric fundus were normal on retroflexion.   Labs / Imaging       Latest Ref Rng & Units 12/10/2021    3:32 PM 04/07/2021   11:47 AM 04/08/2020   12:21 PM  Hepatic Function  Total Protein 6.1 - 8.1 g/dL 7.0   6.7   7.2    Albumin 3.5 - 5.2 g/dL  4.4   4.0    AST 10 - 35 U/L $Remo'15   13   16    'DBWKP$ ALT 6 - 29 U/L $Remo'7   8   12    'YKTLc$ Alk Phosphatase 39 - 117 U/L  90   86    Total Bilirubin 0.2 - 1.2  mg/dL 0.3   0.3   0.4         Latest Ref Rng & Units 12/10/2021    3:32 PM 04/07/2021   11:47 AM 10/07/2020   12:16 PM  CBC  WBC 3.8 - 10.8 Thousand/uL 8.6   6.3   6.7    Hemoglobin 11.7 - 15.5 g/dL 12.3   12.4   12.9    Hematocrit 35.0 - 45.0 % 36.8   36.7   38.9    Platelets 140 - 400 Thousand/uL 249   250.0   258      Current Medications, Allergies, Past Medical History, Past Surgical History, Family History  and Social History were reviewed in [employer] record.   Physical Exam: General: Well developed, well nourished, no acute distress Head: Normocephalic and atraumatic Eyes: Sclerae anicteric, EOMI Ears: Normal auditory acuity Mouth: Not examined Lungs: Clear throughout to auscultation Heart: Regular rate and rhythm; no murmurs, rubs or bruits Abdomen: Soft, non tender and non distended. No masses, hepatosplenomegaly or hernias noted. Normal Bowel sounds Rectal: Perianal prominent vascular pattern, anal deformity, anal stenosis, dark heme negative stool, no lesions Musculoskeletal: Symmetrical with no gross deformities  Pulses:  Normal pulses noted Extremities: No clubbing, cyanosis, edema or deformities noted Neurological: Alert oriented x 4, grossly nonfocal Psychological:  Alert and cooperative. Normal mood and affect   Andrick Rust T. Fuller Plan, MD 04/27/2022, 2:18 PM

## 2022-04-27 NOTE — Patient Instructions (Signed)
Discontinue Pepto-Bismol and Linzess.   Start over the counter Miralax mixing 17 grams in 8 oz of water 1-2 x daily to have a adequate bowel movement daily.   Increase your protonix to twice daily. A new prescription has been sent to your pharmacy.   You have been scheduled for a Barium Esophogram at [facility] on 05/06/22  at 10:30 am. Please arrive 15 minutes prior to your appointment for registration. Make certain not to have anything to eat or drink 3 hours prior to your test. If you need to reschedule for any reason, please contact radiology at [phone] to do so. __________________________________________________________________ A barium swallow is an examination that concentrates on views of the esophagus. This tends to be a double contrast exam (barium and two liquids which, when combined, create a gas to distend the wall of the oesophagus) or single contrast (non-ionic iodine based). The study is usually tailored to your symptoms so a good history is essential. Attention is paid during the study to the form, structure and configuration of the esophagus, looking for functional disorders (such as aspiration, dysphagia, achalasia, motility and reflux) EXAMINATION You may be asked to change into a gown, depending on the type of swallow being performed. A radiologist and radiographer will perform the procedure. The radiologist will advise you of the type of contrast selected for your procedure and direct you during the exam. You will be asked to stand, sit or lie in several different positions and to hold a small amount of fluid in your mouth before being asked to swallow while the imaging is performed .In some instances you may be asked to swallow barium coated marshmallows to assess the motility of a solid food bolus. The exam can be recorded as a digital or video fluoroscopy procedure. POST PROCEDURE It will take 1-2 days for the barium to pass through your system. To facilitate  this, it is important, unless otherwise directed, to increase your fluids for the next 24-48hrs and to resume your normal diet.  This test typically takes about 30 minutes to perform. ___________________________________________________________  We will contact you and fill in blanks of appt date and time.  You have been scheduled for a modified barium swallow on ____________ at ____________. Please arrive 15 minutes prior to your test for registration. You will go to [facility] (1st Floor) for your appointment. Should you need to cancel or reschedule your appointment, please contact [phone] Erin [name]) or [phone] Erin [name]). _____________________________________________________________________ A Modified Barium Swallow Study, or MBS, is a special x-ray that is taken to check swallowing skills. It is carried out by a Stage manager and a Psychologist, clinical (SLP). During this test, yourmouth, throat, and esophagus, a muscular tube which connects your mouth to your stomach, is checked. The test will help you, your doctor, and the SLP plan what types of foods and liquids are easier for you to swallow. The SLP will also identify positions and ways to help you swallow more easily and safely. What will happen during an MBS? You will be taken to an x-ray room and seated comfortably. You will be asked to swallow small amounts of food and liquid mixed with barium. Barium is a liquid or paste that allows images of your mouth, throat and esophagus to be seen on x-ray. The x-ray captures moving images of the food you are swallowing as it travels from your mouth through your throat and into your esophagus. This test helps identify whether food or liquid  is entering your lungs (aspiration). The test also shows which part of your mouth or throat lacks strength or coordination to move the food or liquid in the right direction. This test typically takes 30 minutes to 1 hour to  complete. ___________________________________________________________   Erin [name] have been scheduled for a colonoscopy. Please follow written instructions given to you at your visit today.  Please pick up your prep supplies at the pharmacy within the next 1-3 days. If you use inhalers (even only as needed), please bring them with you on the day of your procedure.  The [facility] providers would like to encourage you to use [facility] to communicate with providers for non-urgent requests or questions.  Due to [name] hold times on the telephone, sending your provider a message by [facility] may be a faster and more efficient way to get a response.  Please allow 48 business hours for a response.  Please remember that this is for non-urgent requests.   Due to recent changes in healthcare laws, you may see the results of your imaging and laboratory studies on MyChart before your provider has had a chance to review them.  We understand that in some cases there may be results that are confusing or concerning to you. Not all laboratory results come back in the same time frame and the provider may be waiting for multiple results in order to interpret others.  Please give [place] 48 hours in order for your provider to thoroughly review all the results before contacting the office for clarification of your results.   Thank you for choosing me and [facility].  Pricilla Riffle. Dagoberto Ligas., MD., [name]

## 2022-04-27 NOTE — Telephone Encounter (Signed)
[facility] Pre-operative Risk Assessment     Request for surgical clearance:     Endoscopy Procedure  What type of surgery is being performed?     Colon  When is this surgery scheduled?     06/22/22  What type of clearance is required ?   Pharmacy  Are there any medications that need to be held prior to surgery and how long? Plavix x 5 days  Practice name and name of physician performing surgery?      [facility]  What is your office phone and fax number?      Phone- [phone]  [phone]  Anesthesia type (None, local, MAC, general) ?       MAC

## 2022-04-28 ENCOUNTER — Telehealth (HOSPITAL_COMMUNITY): Payer: Self-pay

## 2022-04-28 ENCOUNTER — Other Ambulatory Visit (HOSPITAL_COMMUNITY): Payer: Self-pay

## 2022-04-28 DIAGNOSIS — R1310 Dysphagia, unspecified: Secondary | ICD-10-CM

## 2022-04-28 NOTE — Telephone Encounter (Signed)
Attempted to contact patient to schedule Modified Barium Swallow. Patient is needing both Esophagram and Modified Barium Swallow completed. Left voicemail.

## 2022-04-28 NOTE — Telephone Encounter (Signed)
   Name: Erin [name]  DOB: 1956/07/13  MRN: [id number]  Primary Cardiologist: [name], MD  Chart reviewed as part of pre-operative protocol coverage. Because of ELANI DELPH past medical history and time since last visit, she will require a follow-up in-office visit in order to better assess preoperative cardiovascular risk.  Pre-op covering staff: - Please schedule appointment and call patient to inform them. If patient already had an upcoming appointment within acceptable timeframe, please add "pre-op clearance" to the appointment notes so provider is aware. - Please contact requesting surgeon's office via preferred method (i.e, phone, fax) to inform them of need for appointment prior to surgery.  This message will also be routed to Dr. Burt Knack for input on holding Plavix as requested below so that this information is available to the clearing provider at time of patient's appointment.   Lenna Sciara, NP  04/28/2022, 3:15 PM

## 2022-04-28 NOTE — Telephone Encounter (Signed)
Pt is at low risk of holding plavix for the endoscopic procedure as requested. Thank you

## 2022-04-29 NOTE — Telephone Encounter (Signed)
Left message for the pt to call the office to schedule an IN OFFICE appt for pre op clearance . Pt can see Dr. Burt Knack or APP

## 2022-05-02 NOTE — Telephone Encounter (Signed)
Left message to call the office for in office pre op appt.

## 2022-05-03 NOTE — Telephone Encounter (Signed)
Patient has been scheduled for a pre-op clearance appointment with Richardson Dopp, PA-C on 05/10/2022 at 1:55 PM. Will route to requesting surgeons office to make them aware.

## 2022-05-05 NOTE — Telephone Encounter (Signed)
Left VM for patient to return call

## 2022-05-05 NOTE — Telephone Encounter (Addendum)
Patient called requested a call back to go over her treatment plan because she is confused with what she is to schedule for next.

## 2022-05-06 ENCOUNTER — Ambulatory Visit (HOSPITAL_COMMUNITY)
Admission: RE | Admit: 2022-05-06 | Discharge: 2022-05-06 | Disposition: A | Discharge status: Home / Self Care | Payer: Medicare Other | Source: Ambulatory Visit | Attending: Gastroenterology | Admitting: Gastroenterology

## 2022-05-06 DIAGNOSIS — R1310 Dysphagia, unspecified: Secondary | ICD-10-CM

## 2022-05-06 DIAGNOSIS — K5989 Other specified functional intestinal disorders: Secondary | ICD-10-CM | POA: Diagnosis not present

## 2022-05-06 NOTE — Progress Notes (Signed)
Modified Barium Swallow Progress Note  Patient Details  Name: Erin [name] MRN: [id number] Date of Birth: 1956/07/06  Today's Date: 05/06/2022  Modified Barium Swallow completed.  Full report located under Chart Review in the Imaging Section.  Brief recommendations include the following:  Clinical Impression  Pt demonstrated a minimal pharyngeal, moderate cervical esophageal and suspected esophageal dysphagia. Had an esophagram prior to MBS resulting in "mild dysmotility in the prone position.  Prominent cricopharyngeus and small web ventrally at the same level  without apparent obstruction. 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 During MBS her timing of swallow is within functional limits. She performs an effortful and Mendelsohn maneuver (continuous laryngeal elevation during and after most swallows appearing to assist in transit through upper esophageal sphincter. There was no aspiration but flash laryngeal penetration that exited vestibule during swallow x 1 with thin that is in normal range. Minimal and intermittent vallecular and pyriform sinus residue possibly due to decreased pharyngeal pressures due to suspected esophageal involvement. In swallowing pill pt spontaneously performed a left head turn and pressed into left pharynx with hand to assist in propulsion. 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 Multiple sips liquid was not effective in propelling pill from mid-upper esopahgus however applesauce did move pill slowly to stomach. Moderate stasis in esophageal seen. Pt educated on esophageal precautions, many she was already performing with verbal and written means. Recommend continue regular texture (states he has to cut her meats into smaller pieces), thin liquids, pills with applesauce and multiple bites to assist in  esophageal propulsion, alternate liquids and solids and esophageal precautions. Would she benefit from EGD given observations and history of esophageal web?   Swallow Evaluation Recommendations   Recommended Consults:  (consider EGD- dilation??)   SLP Diet Recommendations: Regular solids;Thin liquid   Liquid Administration via: Cup;Straw   Medication Administration: Whole meds with liquid (followed by applesauce)   Supervision: Patient able to self feed   Compensations: Follow solids with liquid   Postural Changes: Seated upright at 90 degrees;Remain semi-upright after after feeds/meals (Comment)   Oral Care Recommendations: Oral care BID        Houston Siren 05/06/2022,2:59 PM

## 2022-05-10 ENCOUNTER — Ambulatory Visit: Payer: Medicare Other | Admitting: Physician Assistant

## 2022-05-10 DIAGNOSIS — Z01810 Encounter for preprocedural cardiovascular examination: Secondary | ICD-10-CM | POA: Insufficient documentation

## 2022-05-10 DIAGNOSIS — I5032 Chronic diastolic (congestive) heart failure: Secondary | ICD-10-CM | POA: Insufficient documentation

## 2022-05-10 NOTE — Telephone Encounter (Signed)
Left message for patient to return my call.

## 2022-05-10 NOTE — Progress Notes (Deleted)
Cardiology Office Note:    Date:  05/10/2022   ID:  Erin [name], DOB 02-Sep-1956, MRN [id number]  PCP:  [name], MD (Inactive)  [facility] Providers Cardiologist:  [name], MD Cardiology APP:  Liliane Shi, PA-C { Click to update primary MD,subspecialty MD or APP then REFRESH:1}  *** Referring MD: No ref. provider found   Chief Complaint:  No chief complaint on file. 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 Trula Slade) S/p aortobifem and bilateral femoral to popliteal bypasses 07/2017 Chronic mesenteric ischemia S/p balloon angioplasty to SMA and celiac artery in 02/2020 (Dr. Trula Slade) Hypokalemia Anal CA s/p XRT 2014 Diabetes mellitus Hyperlipidemia  Prior CV Studies: {Select studies to display:26339}  Echo 01/24/18 EF 55-60, no RWMA, normal diastolic function, trivial MR/TR   Echo 10/23/2017 EF 35-40   Cardiac catheterization 10/13/2017 LAD mid 40 LCx normal RCA proximal 30, mid 99, 30, distal 50 EF 40-45 PCI: 2 x 12 mm Resolute Onyx DES to the mid RCA   Nuclear stress test 08/09/2017 EF 44, normal perfusion, low risk   Echo 06/08/2016 EF 55-60, normal wall motion, grade 1 diastolic dysfunction  History of Present Illness:   Erin [name] is a 66 y.o. female with the above problem list.  She was last seen in April 2022.  Of note, she lives closer to [place] and has requested transfer to Dr. Harl Bowie at our [place] office.  She returns for follow-up and surgical clearance.  She needs to undergo EGD with Dr. Fuller Plan 06/22/2022.  It has been requested that she hold  clopidogrel for 5 days.  Dr. Burt Knack has reviewed her chart and cleared her to hold clopidogrel for 5 days. ***       Past Medical History:  Diagnosis Date   Allergy    Alopecia    Anal cancer ([place]) 08/14/13   invasive squamous cell ca, s/p radiation 10/20-11/26/14 60.4Gy/62f and chemo   Anxiety    Aortoiliac occlusive disease ([place] 08/14/2017   B12 deficiency anemia 09/14/2015   Blood transfusion without reported diagnosis    BPPV (benign paroxysmal positional vertigo) 07/08/2015   Cardiomyopathy ([place] 11/03/2017   Chronic back pain    Chronic daily headache 03/29/2013   takes bc powder   Closed right hip fracture ([place] 09/10/2015   Coronary artery disease involving native coronary artery of native heart without angina pectoris 10/13/2017   DES to mid RCA   Depression    Essential (hemorrhagic) thrombocythemia ([place] 09/06/2018   Family history of early CAD 04/08/2016   GERD (gastroesophageal reflux disease)    History of hiatal hernia    Hot flashes    Hyperlipidemia    Hyperlipidemia associated with type 2 diabetes mellitus ([place] 05/11/2017   Hypothyroidism 09/10/2015   IBS (irritable bowel syndrome) 03/29/2013   Neck pain 07/08/2015   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   takes neurotin   Peripheral vascular disease ([place] 11/03/2017   QT prolongation    Sacroiliitis ([place] 09/06/2018   Spasms of the hands or feet 10/08/2017   Syncope 06/07/2016   Tubular adenoma of colon 09/08/2003   Vertigo    Wears glasses    Current  Medications: No outpatient medications have been marked as taking for the 05/10/22 encounter (Appointment) with Richardson Dopp T, PA-C.    Allergies:   Amitiza [lubiprostone], Nortriptyline, Sulfa antibiotics, Tramadol, Atorvastatin, Claritin [loratadine], Dexamethasone, and Prednisone   Social History   Tobacco Use   Smoking status: Former    Packs/day: 0.50    Types: Cigarettes    Quit date: 10/07/2014    Years since quitting: 7.5   Smokeless tobacco: Never  Vaping  Use   Vaping Use: Never used  Substance Use Topics   Alcohol use: No    Alcohol/week: 0.0 standard drinks of alcohol   Drug use: No    Family Hx: The patient's family history includes Arthritis in her mother; Heart attack in her father, maternal grandfather, maternal uncle, and mother; Heart disease in her father and mother; Hyperlipidemia in her father and mother; Hypertension in her father and mother; Irritable bowel syndrome in her mother; Lung cancer (age of onset: 1) in her brother; Prostate cancer in her father; Stroke (age of onset: 79) in her mother; Stroke (age of onset: 32) in her father; Thyroid disease in her father and mother. There is no history of Colon cancer, Rectal cancer, Stomach cancer, or Esophageal cancer.  ROS   EKGs/Labs/Other Test Reviewed:    EKG:  EKG is *** ordered today.  The ekg ordered today demonstrates ***  Recent Labs: 12/10/2021: ALT 7; BUN 12; Creat 0.94; Hemoglobin 12.3; Magnesium 2.1; Platelets 249; Potassium 4.0; Sodium 138; TSH 0.76   Recent Lipid Panel Recent Labs    12/10/21 1532  CHOL 156  TRIG 291*  HDL 40*  LDLCALC 78     Risk Assessment/Calculations/Metrics:   {Does this patient have ATRIAL FIBRILLATION?[phone]}     No BP recorded.  {Refresh Note OR Click here to enter BP  :1}***    Physical Exam:    VS:  There were no vitals taken for this visit.    Wt Readings from Last 3 Encounters:  04/27/22 119 lb 2 oz (54 kg)  02/28/22 114 lb (51.7 kg)  12/10/21 119 lb 3.2 oz (54.1 kg)    Physical Exam ***     ASSESSMENT & PLAN:   No problem-specific Assessment & Plan notes found for this encounter. *** 1. Coronary artery disease involving native coronary artery of native heart without angina pectoris History of drug-eluting stent to the mid RCA in November 2018.  She had mild to moderate nonobstructive disease elsewhere.  She had been maintained on aspirin along with low-dose rivaroxaban.  She has been switched back to  clopidogrel along with aspirin due to cost.  She is tolerating this well.  She has not had anginal symptoms.  Continue current dose of metoprolol succinate, rosuvastatin, aspirin, clopidogrel.  Follow-up 1 year.   2. Peripheral vascular disease ([place]) Status post prior aortobifemoral and bilateral femoral to popliteal bypasses in 2018.  Status post balloon angioplasty to the SMA and celiac artery in 2021.  She is followed by vascular surgery.   3. HFimpEF (heart failure with improved EF) EF was previously 35-40.  This was likely stress-induced cardiomyopathy.  This is returned to normal by echocardiogram in 2019.   4. Hyperlipidemia LDL goal <70 Last LDL 5/21 was 83.  She has follow-up labs with her primary care provider in May.  If her LDL remains >70, I will add ezetimibe to her medical regimen.       {Are you ordering a CV Procedure (e.g. stress test, cath, DCCV, TEE,  etc)?   Press F2        :[id number]  Dispo:  No follow-ups on file.   Medication Adjustments/Labs and Tests Ordered: Current medicines are reviewed at length with the patient today.  Concerns regarding medicines are outlined above.  Tests Ordered: No orders of the defined types were placed in this encounter.  Medication Changes: No orders of the defined types were placed in this encounter.  Signed, Richardson Dopp, PA-C  05/10/2022 8:50 AM    [facility] [place], [place], [place]  [address] Phone: [phone]; Fax: [phone]

## 2022-05-12 ENCOUNTER — Other Ambulatory Visit: Payer: Self-pay

## 2022-05-12 NOTE — Telephone Encounter (Signed)
Patient returned call. Wants Estill Bamberg to know that she has an appointment scheduled for 05/23/2022 with her heart doctor for the clearance.

## 2022-05-12 NOTE — Telephone Encounter (Signed)
Left a detailed voicemail to message to return my call.

## 2022-05-13 NOTE — Telephone Encounter (Signed)
Left message for patient to return my call.

## 2022-05-21 ENCOUNTER — Other Ambulatory Visit: Payer: Self-pay | Admitting: Physician Assistant

## 2022-05-23 ENCOUNTER — Ambulatory Visit: Payer: Medicare Other | Admitting: Physician Assistant

## 2022-05-23 ENCOUNTER — Encounter: Payer: Self-pay | Admitting: Physician Assistant

## 2022-05-23 VITALS — BP 140/70 | HR 59 | Ht 63.0 in | Wt 118.8 lb

## 2022-05-23 DIAGNOSIS — E785 Hyperlipidemia, unspecified: Secondary | ICD-10-CM | POA: Diagnosis not present

## 2022-05-23 DIAGNOSIS — I2510 Atherosclerotic heart disease of native coronary artery without angina pectoris: Secondary | ICD-10-CM

## 2022-05-23 DIAGNOSIS — I739 Peripheral vascular disease, unspecified: Secondary | ICD-10-CM

## 2022-05-23 DIAGNOSIS — I5032 Chronic diastolic (congestive) heart failure: Secondary | ICD-10-CM

## 2022-05-23 DIAGNOSIS — Z01810 Encounter for preprocedural cardiovascular examination: Secondary | ICD-10-CM

## 2022-05-23 NOTE — Progress Notes (Addendum)
Cardiology Office Note:    Date:  05/23/2022   ID:  Erin [name], DOB May 23, 1956, MRN [id number]  PCP:  [name], MD (Inactive)  [facility] Providers Cardiologist:  [name], MD Cardiology APP:  Sh[facility] [place]     Referring MD: No ref. 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 Trula Slade) S/p aortobifem and bilateral femoral to popliteal bypasses 07/2017 Chronic mesenteric ischemia S/p balloon angioplasty to SMA and celiac artery in 02/2020 (Dr. Trula Slade) Hypokalemia Anal CA s/p XRT 2014 Diabetes mellitus Hyperlipidemia  Prior CV Studies: Echo 01/24/18 EF 55-60, no RWMA, normal diastolic function, trivial MR/TR   Echo 10/23/2017 EF 35-40   Cardiac catheterization 10/13/2017 LAD mid 40 LCx normal RCA proximal 30, mid 99, 30, distal 50 EF 40-45 PCI: 2 x 12 mm Resolute Onyx DES to the mid RCA   Nuclear stress test 08/09/2017 EF 44, normal perfusion, low risk    History of Present Illness:   Erin [name] is a 66 y.o. female with the above problem list.  She was last seen in April 2022.  Of note, she lives closer to [place] and has requested transfer to Dr. Harl Bowie at our [place] office.  She returns for follow-up and surgical clearance.  She needs to undergo EGD with Dr. Fuller Plan 06/22/2022.  It has been requested that she hold clopidogrel for 5 days.  Dr. Burt Knack has reviewed her chart and cleared her to hold clopidogrel for 5 days.  She is here alone.  She has not had chest pain, significant shortness of breath, syncope.  She has occasional  palpitations that last only seconds.  She has not had any sustained palpitations or rapid heartbeats.  She has not had syncope, orthopnea or leg edema.       Past Medical History:  Diagnosis Date   Allergy    Alopecia    Anal cancer ([place]) 08/14/13   invasive squamous cell ca, s/p radiation 10/20-11/26/14 60.4Gy/52f and chemo   Anxiety    Aortoiliac occlusive disease ([facility] 08/14/2017   B12 deficiency anemia 09/14/2015   Blood transfusion without reported diagnosis    BPPV (benign paroxysmal positional vertigo) 07/08/2015   Cardiomyopathy ([facility] 11/03/2017   Chronic back pain    Chronic daily headache 03/29/2013   takes bc powder   Closed right hip fracture ([facility] 09/10/2015   Coronary artery disease involving native coronary artery of native heart without angina pectoris 10/13/2017   DES to mid RCA   Depression    Essential (hemorrhagic) thrombocythemia ([facility] 09/06/2018   Family history of early CAD 04/08/2016   GERD (gastroesophageal reflux disease)    History of hiatal hernia    Hot flashes    Hyperlipidemia    Hyperlipidemia associated with type 2 diabetes mellitus ([facility] 05/11/2017   Hypothyroidism 09/10/2015   IBS (irritable bowel syndrome) 03/29/2013   Neck pain 07/08/2015   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   takes neurotin   Peripheral vascular disease ([facility] 11/03/2017   QT prolongation    Sacroiliitis ([facility] 09/06/2018   Spasms of the hands or feet 10/08/2017   Syncope 06/07/2016   Tubular adenoma of colon 09/08/2003  Vertigo    Wears glasses    Current Medications: Current Meds  Medication Sig   acetaminophen (TYLENOL) 500 MG tablet Take 1,000 mg by mouth daily as needed for headache (pain).    acyclovir ointment (ZOVIRAX) 5 % Apply 1 application topically every 3 (three) hours as needed (fever blisters).   aspirin EC 81 MG tablet Take 81 mg by mouth daily.   Cholecalciferol (VITAMIN D) 2000 units tablet Take 2,000 Units by mouth daily.   clopidogrel (PLAVIX) 75 MG tablet TAKE 1  TABLET BY MOUTH EVERY DAY   dicyclomine (BENTYL) 10 MG capsule TAKE 1 CAPSULE BY MOUTH THREE TIMES A DAY BEFORE MEALS   DULoxetine (CYMBALTA) 20 MG capsule TAKE 2-3 CAPSULES BY MOUTH EVERY DAY   fluticasone (FLONASE) 50 MCG/ACT nasal spray Place 1 spray into both nostrils daily.   gabapentin (NEURONTIN) 600 MG tablet TAKE 1 TABLET BY MOUTH THREE TIMES DAILY GENERIC EQUIVALENT FOR NEURONTIN   levothyroxine (SYNTHROID) 75 MCG tablet TAKE 1 TABLETBY MOUTH DAILY BEFORE BREAKFAST MONDAY-FRIDAY, THEN 1/2 SATURDAY AND SUNDAY   linaclotide (LINZESS) 72 MCG capsule Take 1 capsule (72 mcg total) by mouth daily before breakfast.   meclizine (ANTIVERT) 25 MG tablet TAKE 1 TABLET BY MOUTH 3 TIMES DAILY AS NEEDED FOR DIZZINESS.   nitroGLYCERIN (NITROSTAT) 0.4 MG SL tablet Place 1 tablet (0.4 mg total) under the tongue every 5 (five) minutes as needed for chest pain.   nystatin-triamcinolone (MYCOLOG II) cream Apply 1 application topically 2 (two) times daily as needed (dry skin).    pantoprazole (PROTONIX) 40 MG tablet Take 1 tablet (40 mg total) by mouth 2 (two) times daily.   potassium chloride SA (KLOR-CON M) 20 MEQ tablet Take 20 mEq by mouth as needed (for fluid or edema).   rosuvastatin (CRESTOR) 40 MG tablet Take 1 tablet (40 mg total) by mouth daily. Please keep upcoming appt. In April with Cardiologist in order to receive further refills. Thank You.   tiZANidine (ZANAFLEX) 4 MG tablet as needed.   vitamin B-12 1000 MCG tablet Take 1 tablet (1,000 mcg total) by mouth daily.   [DISCONTINUED] metoprolol succinate (TOPROL-XL) 25 MG 24 hr tablet TAKE 1 TABLET BY MOUTH 2 (TWO) TIMES DAILY. PLEASE KEEP UPCOMING APPT. IN APRIL    Allergies:   Amitiza [lubiprostone], Nortriptyline, Sulfa antibiotics, Tramadol, Atorvastatin, Claritin [loratadine], Dexamethasone, and Prednisone   Social History   Tobacco Use   Smoking status: Former    Packs/day: 0.50    Types: Cigarettes    Quit date: 10/07/2014    Years  since quitting: 7.6   Smokeless tobacco: Never  Vaping Use   Vaping Use: Never used  Substance Use Topics   Alcohol use: No    Alcohol/week: 0.0 standard drinks of alcohol   Drug use: No    Family Hx: The patient's family history includes Arthritis in her mother; Heart attack in her father, maternal grandfather, maternal uncle, and mother; Heart disease in her father and mother; Hyperlipidemia in her father and mother; Hypertension in her father and mother; Irritable bowel syndrome in her mother; Lung cancer (age of onset: 43) in her brother; Prostate cancer in her father; Stroke (age of onset: 18) in her mother; Stroke (age of onset: 52) in her father; Thyroid disease in her father and mother. There is no history of Colon cancer, Rectal cancer, Stomach cancer, or Esophageal cancer.  Review of Systems  Gastrointestinal:  Negative for hematochezia.  Genitourinary:  Negative for hematuria.  EKGs/Labs/Other Test Reviewed:    EKG:  EKG is  ordered today.  The ekg ordered today demonstrates sinus bradycardia, HR 59, normal axis, septal Q waves, no ST-T wave changes, increased artifact, QTc 445  Recent Labs: 12/10/2021: ALT 7; BUN 12; Creat 0.94; Hemoglobin 12.3; Magnesium 2.1; Platelets 249; Potassium 4.0; Sodium 138; TSH 0.76   Recent Lipid Panel Recent Labs    12/10/21 1532  CHOL 156  TRIG 291*  HDL 40*  LDLCALC 78     Risk Assessment/Calculations/Metrics:              Physical Exam:    VS:  BP 140/70   Pulse (!) 59   Ht _0  (1.6 m)   Wt 118 lb 12.8 oz (53.9 kg)   SpO2 96%   BMI 21.04 kg/m     Wt Readings from Last 3 Encounters:  05/23/22 118 lb 12.8 oz (53.9 kg)  04/27/22 119 lb 2 oz (54 kg)  02/28/22 114 lb (51.7 kg)    Constitutional:      Appearance: Healthy appearance. Not in distress.  Neck:     Vascular: No JVR. JVD normal.  Pulmonary:     Effort: Pulmonary effort is normal.     Breath sounds: No wheezing. No rales.  Cardiovascular:     Normal  rate. Regular rhythm. Normal S1. Normal S2.      Murmurs: There is no murmur.  Edema:    Peripheral edema absent.  Abdominal:     Palpations: Abdomen is soft.  Skin:    General: Skin is warm and dry.  Neurological:     Mental Status: Alert and oriented to person, place and time.         ASSESSMENT & PLAN:   Preoperative cardiovascular examination Ms. Mcclenny's perioperative risk of a major cardiac event is 6.6% according to the Revised Cardiac Risk Index (RCRI).  Therefore, she is at high risk for perioperative complications.   Her functional capacity is fair at 4.06 METs according to the Duke Activity Status Index (DASI). Recommendations: According to ACC/AHA guidelines, no further cardiovascular testing needed.  The patient may proceed to surgery at acceptable risk.   Antiplatelet and/or Anticoagulation Recommendations: The patient should remain on Aspirin without interruption.   Clopidogrel (Plavix) can be held for 5 days prior to her surgery and resumed as soon as possible post op.   Coronary artery disease involving native coronary artery of native heart without angina pectoris History of DES to the mid RCA in 2018.  She is doing well without anginal symptoms.  Continue aspirin 81 mg daily, Plavix 75 mg daily, Toprol-XL 25 mg daily, Crestor 40 mg daily.  Follow-up in 6 months.  Peripheral vascular disease ([place]) History of aortobifemoral and bilateral femoral to popliteal bypass in 2018 and balloon angioplasty to the SMA and celiac artery in 2021.  She is followed by vascular surgery.  HFimpEF (heart failure with improved EF) History of stress-induced cardiomyopathy with return of normal LV function.          Dispo:  Return in about 6 months (around 11/23/2022) for Routine Follow Up, w/ Dr. Burt Knack, or Richardson Dopp, PA-C.   Medication Adjustments/Labs and Tests Ordered: Current medicines are reviewed at length with the patient today.  Concerns regarding medicines are outlined  above.  Tests Ordered: Orders Placed This Encounter  Procedures   Lipid panel   Comp Met (CMET)   Medication Changes: No orders of the defined types were placed in this  encounter.  Signed, Richardson Dopp, PA-C  05/23/2022 5:26 PM    [facility] [address], [place], [place]  [address] Phone: [phone]; Fax: [phone]

## 2022-05-23 NOTE — Telephone Encounter (Signed)
See office note from Richardson Dopp, PA-C today:   Erin [name] is a 66 y.o. female with the above problem list.  She was last seen in April 2022.  Of note, she lives closer to [place] and has requested transfer to Dr. Harl Bowie at our [place] office.  She returns for follow-up and surgical clearance.  She needs to undergo EGD with Dr. Fuller Plan 06/22/2022.  It has been requested that she hold clopidogrel for 5 days.  Dr. Burt Knack has reviewed her chart and cleared her to hold clopidogrel for 5 days.  She is here alone.  She has not had chest pain, significant shortness of breath, syncope.  She has occasional palpitations that last only seconds.  She has not had any sustained palpitations or rapid heartbeats.  She has not had syncope, orthopnea or leg edema.

## 2022-05-23 NOTE — Assessment & Plan Note (Signed)
History of stress-induced cardiomyopathy with return of normal LV function.

## 2022-05-23 NOTE — Assessment & Plan Note (Addendum)
Ms. Odeh perioperative risk of a major cardiac event is 6.6% according to the Revised Cardiac Risk Index (RCRI).  Therefore, she is at high risk for perioperative complications.   Her functional capacity is fair at 4.06 METs according to the Duke Activity Status Index (DASI). Recommendations: According to ACC/AHA guidelines, no further cardiovascular testing needed.  The patient may proceed to surgery at acceptable risk.   Antiplatelet and/or Anticoagulation Recommendations: The patient should remain on Aspirin without interruption.   Clopidogrel (Plavix) can be held for 5 days prior to her surgery and resumed as soon as possible post op.

## 2022-05-23 NOTE — Assessment & Plan Note (Signed)
History of aortobifemoral and bilateral femoral to popliteal bypass in 2018 and balloon angioplasty to the SMA and celiac artery in 2021.  She is followed by vascular surgery.

## 2022-05-23 NOTE — Assessment & Plan Note (Signed)
History of DES to the mid RCA in 2018.  She is doing well without anginal symptoms.  Continue aspirin 81 mg daily, Plavix 75 mg daily, Toprol-XL 25 mg daily, Crestor 40 mg daily.  Follow-up in 6 months.

## 2022-05-23 NOTE — Patient Instructions (Signed)
Medication Instructions:  Your physician recommends that you continue on your current medications as directed. Please refer to the Current Medication list given to you today.  *If you need a refill on your cardiac medications before your next appointment, please call your pharmacy*   Lab Work: TODAY:  CMET & LIPID  If you have labs (blood work) drawn today and your tests are completely normal, you will receive your results only by: [place] (if you have MyChart) OR A paper copy in the mail If you have any lab test that is abnormal or we need to change your treatment, we will call you to review the results.   Testing/Procedures: None ordered   Follow-Up: At [facility], you and your health needs are our priority.  As part of our continuing mission to provide you with exceptional heart care, we have created designated Provider Care Teams.  These Care Teams include your primary Cardiologist (physician) and Advanced Practice Providers (APPs -  Physician Assistants and Nurse Practitioners) who all work together to provide you with the care you need, when you need it.  We recommend signing up for the patient portal called "MyChart".  Sign up information is provided on this After Visit Summary.  MyChart is used to connect with patients for Virtual Visits (Telemedicine).  Patients are able to view lab/test results, encounter notes, upcoming appointments, etc.  Non-urgent messages can be sent to your provider as well.   To learn more about what you can do with MyChart, go to [email or web address].    Your next appointment:   6 month(s)  11/18/22 arrive at 0?05  The format for your next appointment:   In Person  Provider:   [name], MD     Other Instructions   Important Information About Sugar

## 2022-05-24 LAB — COMPREHENSIVE METABOLIC PANEL
ALT: 7 IU/L (ref 0–32)
AST: 15 IU/L (ref 0–40)
Albumin/Globulin Ratio: 2 (ref 1.2–2.2)
Albumin: 4.4 g/dL (ref 3.8–4.8)
Alkaline Phosphatase: 95 IU/L (ref 44–121)
BUN/Creatinine Ratio: 14 (ref 12–28)
BUN: 12 mg/dL (ref 8–27)
Bilirubin Total: 0.2 mg/dL (ref 0.0–1.2)
CO2: 22 mmol/L (ref 20–29)
Calcium: 9.1 mg/dL (ref 8.7–10.3)
Chloride: 103 mmol/L (ref 96–106)
Creatinine, Ser: 0.85 mg/dL (ref 0.57–1.00)
Globulin, Total: 2.2 g/dL (ref 1.5–4.5)
Glucose: 80 mg/dL (ref 70–99)
Potassium: 3.8 mmol/L (ref 3.5–5.2)
Sodium: 140 mmol/L (ref 134–144)
Total Protein: 6.6 g/dL (ref 6.0–8.5)
eGFR: 76 mL/min/{1.73_m2} (ref >59)

## 2022-05-24 LAB — LIPID PANEL
Chol/HDL Ratio: 4 ratio (ref 0.0–4.4)
Cholesterol, Total: 147 mg/dL (ref 100–199)
HDL: 37 mg/dL — ABNORMAL LOW (ref >39)
LDL Chol Calc (NIH): 71 mg/dL (ref 0–99)
Triglycerides: 237 mg/dL — ABNORMAL HIGH (ref 0–149)
VLDL Cholesterol Cal: 39 mg/dL (ref 5–40)

## 2022-05-25 ENCOUNTER — Other Ambulatory Visit: Payer: Self-pay | Admitting: *Deleted

## 2022-05-25 MED ORDER — LEVOTHYROXINE SODIUM 75 MCG PO TABS: ORAL_TABLET | ORAL | 0 refills | Status: DC

## 2022-05-27 NOTE — Addendum Note (Signed)
Addended by: Drue Novel I on: 05/27/2022 12:40 AM   Modules accepted: Orders

## 2022-05-27 NOTE — Addendum Note (Signed)
Addended by: Briant Cedar on: 05/27/2022 09:52 AM   Modules accepted: Orders

## 2022-05-29 ENCOUNTER — Other Ambulatory Visit: Payer: Self-pay | Admitting: Physician Assistant

## 2022-05-31 ENCOUNTER — Encounter: Payer: Self-pay | Admitting: *Deleted

## 2022-06-13 ENCOUNTER — Telehealth: Payer: Self-pay | Admitting: Cardiovascular Disease

## 2022-06-13 DIAGNOSIS — Z79899 Other long term (current) drug therapy: Secondary | ICD-10-CM

## 2022-06-13 DIAGNOSIS — I2510 Atherosclerotic heart disease of native coronary artery without angina pectoris: Secondary | ICD-10-CM

## 2022-06-13 DIAGNOSIS — E781 Pure hyperglyceridemia: Secondary | ICD-10-CM

## 2022-06-13 NOTE — Telephone Encounter (Signed)
Patient returned RN's call regarding results. 

## 2022-06-13 NOTE — Telephone Encounter (Signed)
Left a message for the pt to call back.  

## 2022-06-14 MED ORDER — ICOSAPENT ETHYL 1 G PO CAPS: 2.0000 g | ORAL_CAPSULE | Freq: Two times a day (BID) | ORAL | 3 refills | Status: DC

## 2022-06-14 NOTE — Telephone Encounter (Signed)
Triglycerides elevated.  LDL close to goal. Creatinine, K+, LFTs normal.   PLAN:  -Start Vascepa 2 grams twice daily  -Fasting Lipids, LFTs in 3 mos.  Richardson Dopp, PA-C    05/24/2022 11:38 AM      Pt advised and will have repeat fasting labs on 09/14/22.

## 2022-06-16 ENCOUNTER — Encounter: Payer: Self-pay | Admitting: Gastroenterology

## 2022-06-16 ENCOUNTER — Telehealth: Payer: Self-pay | Admitting: Cardiovascular Disease

## 2022-06-16 DIAGNOSIS — E781 Pure hyperglyceridemia: Secondary | ICD-10-CM

## 2022-06-16 DIAGNOSIS — E785 Hyperlipidemia, unspecified: Secondary | ICD-10-CM

## 2022-06-16 NOTE — Telephone Encounter (Signed)
Pt c/o medication issue:  1. Name of Medication:   icosapent Ethyl (VASCEPA) 1 g capsule  2. How are you currently taking this medication (dosage and times per day)?  As prescribed  3. Are you having a reaction (difficulty breathing--STAT)?   No  4. What is your medication issue?   Patient called stating this medication is hurting her stomach and she would like an alternate medication.

## 2022-06-16 NOTE — Telephone Encounter (Signed)
Called pt back in regards to Vascepa side effect.  Reports has not started medication.  Was looking over medication information and noticed was on med about a year ago and it cause stomach to feel like it had knots.  Symptoms developed about 2 weeks after starting medication.  Pt also had trouble swallowing med.  Would like an alternate medication.

## 2022-06-17 ENCOUNTER — Other Ambulatory Visit: Payer: Self-pay | Admitting: *Deleted

## 2022-06-17 MED ORDER — DULOXETINE HCL 20 MG PO CPEP: ORAL_CAPSULE | ORAL | 0 refills | Status: DC

## 2022-06-17 NOTE — Telephone Encounter (Signed)
Referral for Lipid Clinic has been placed. Pt made aware.

## 2022-06-19 ENCOUNTER — Encounter: Payer: Self-pay | Admitting: Certified Registered Nurse Anesthetist

## 2022-06-22 ENCOUNTER — Ambulatory Visit (AMBULATORY_SURGERY_CENTER): Payer: Medicare Other | Admitting: Gastroenterology

## 2022-06-22 ENCOUNTER — Encounter: Payer: Medicare Other | Admitting: Gastroenterology

## 2022-06-22 ENCOUNTER — Encounter: Payer: Self-pay | Admitting: Gastroenterology

## 2022-06-22 VITALS — BP 111/54 | HR 56 | Temp 96.6°F | Resp 19 | Ht 63.0 in | Wt 119.0 lb

## 2022-06-22 DIAGNOSIS — K629 Disease of anus and rectum, unspecified: Secondary | ICD-10-CM | POA: Diagnosis not present

## 2022-06-22 DIAGNOSIS — R1310 Dysphagia, unspecified: Secondary | ICD-10-CM | POA: Diagnosis not present

## 2022-06-22 DIAGNOSIS — Z86010 Personal history of colonic polyps: Secondary | ICD-10-CM

## 2022-06-22 DIAGNOSIS — K219 Gastro-esophageal reflux disease without esophagitis: Secondary | ICD-10-CM | POA: Diagnosis not present

## 2022-06-22 DIAGNOSIS — E119 Type 2 diabetes mellitus without complications: Secondary | ICD-10-CM | POA: Diagnosis not present

## 2022-06-22 DIAGNOSIS — R933 Abnormal findings on diagnostic imaging of other parts of digestive tract: Secondary | ICD-10-CM

## 2022-06-22 DIAGNOSIS — Z09 Encounter for follow-up examination after completed treatment for conditions other than malignant neoplasm: Secondary | ICD-10-CM

## 2022-06-22 DIAGNOSIS — D124 Benign neoplasm of descending colon: Secondary | ICD-10-CM | POA: Diagnosis not present

## 2022-06-22 DIAGNOSIS — K449 Diaphragmatic hernia without obstruction or gangrene: Secondary | ICD-10-CM | POA: Diagnosis not present

## 2022-06-22 DIAGNOSIS — D123 Benign neoplasm of transverse colon: Secondary | ICD-10-CM

## 2022-06-22 DIAGNOSIS — I2510 Atherosclerotic heart disease of native coronary artery without angina pectoris: Secondary | ICD-10-CM | POA: Diagnosis not present

## 2022-06-22 DIAGNOSIS — C210 Malignant neoplasm of anus, unspecified: Secondary | ICD-10-CM

## 2022-06-22 MED ORDER — SODIUM CHLORIDE 0.9 % IV SOLN: 500.0000 mL | Freq: Once | INTRAVENOUS | Status: DC

## 2022-06-22 NOTE — Progress Notes (Signed)
History & Physical  Primary Care Physician:  [name], MD (Inactive) Primary Gastroenterologist: Lucio Edward, MD  CHIEF COMPLAINT:  Personal history of adenomatous colon polyps, personal history of anal cancer, dysphagia, abnormal esophagram   HPI: Erin [name] is a 66 y.o. female with a personal history of adenomatous colon polyps, personal history of anal cancer, dysphagia, abnormal esophagram for colonoscopy and EGD.  Plavix held for 5 days prior to procedures.   Past Medical History:  Diagnosis Date   Allergy    Alopecia    Anal cancer ([place]) 08/14/13   invasive squamous cell ca, s/p radiation 10/20-11/26/14 60.4Gy/1f and chemo   Anxiety    Aortoiliac occlusive disease ([facility] 08/14/2017   B12 deficiency anemia 09/14/2015   Blood transfusion without reported diagnosis    BPPV (benign paroxysmal positional vertigo) 07/08/2015   Cardiomyopathy ([facility] 11/03/2017   Chronic back pain    Chronic daily headache 03/29/2013   takes bc powder   Closed right hip fracture ([facility] 09/10/2015   Coronary artery disease involving native coronary artery of native heart without angina pectoris 10/13/2017   DES to mid RCA   Depression    Essential (hemorrhagic) thrombocythemia ([place] 09/06/2018   Family history of early CAD 04/08/2016   GERD (gastroesophageal reflux disease)    History of hiatal hernia    Hot flashes    Hyperlipidemia    Hyperlipidemia associated with type 2 diabetes mellitus ([facility] 05/11/2017   Hypothyroidism 09/10/2015   IBS (irritable bowel syndrome) 03/29/2013   Neck pain 07/08/2015   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   takes neurotin   Peripheral vascular disease ([place] 11/03/2017   QT prolongation    Sacroiliitis ([place] 09/06/2018   Spasms of the hands or feet 10/08/2017   Syncope 06/07/2016   Tubular adenoma of colon 09/08/2003   Vertigo    Wears glasses     Past Surgical History:  Procedure Laterality Date   ABDOMINAL AORTOGRAM N/A 08/02/2017   Procedure:  ABDOMINAL AORTOGRAM;  Surgeon: AWellington Hampshire MD;  [facility];  Service: Cardiovascular;  Laterality: N/A;   AORTA - BILATERAL FEMORAL ARTERY BYPASS GRAFT N/A 08/14/2017   Procedure: AORTA BIFEMORAL BYPASS GRAFT;  Surgeon: ERosetta Posner MD;  [facility];  Service: Vascular;  Laterality: N/A;   COLONOSCOPY     CORONARY STENT INTERVENTION N/A 10/13/2017   Procedure: CORONARY STENT INTERVENTION;  Surgeon: ENelva Bush MD;  [facility];  Service: Cardiovascular;  Laterality: N/A;   dental implant     ECTOPIC PREGNANCY SURGERY     EMBOLECTOMY N/A 08/14/2017   Procedure: EMBOLECTOMY FEMORAL;  Surgeon: ERosetta Posner MD;  [facility];  Service: Vascular;  Laterality: N/A;   FEMORAL-POPLITEAL BYPASS GRAFT Right 08/14/2017   Procedure: Right Femoral to Above Knee Popliteal Bypass Graft using Non-Reversed Greater Saphenous Vein Graft from Right Leg;  Surgeon: ERosetta Posner MD;  [place] [facility]  Service: Vascular;  Laterality: Right;   FLEXIBLE SIGMOIDOSCOPY N/A 08/14/2013   Procedure: FLEXIBLE SIGMOIDOSCOPY;  Surgeon: MLadene Artist MD;  [facility];  Service: Endoscopy;  Laterality: N/A;   LEFT HEART CATH AND CORONARY ANGIOGRAPHY N/A 10/13/2017   Procedure: LEFT HEART CATH AND CORONARY ANGIOGRAPHY;  Surgeon: ENelva Bush MD;  [facility];  Service: Cardiovascular;  Laterality: N/A;   LOWER EXTREMITY ANGIOGRAPHY Bilateral 08/02/2017   Procedure: Lower Extremity Angiography;  Surgeon: AWellington Hampshire MD;  [facility];  Service:  Cardiovascular;  Laterality: Bilateral;   MULTIPLE TOOTH EXTRACTIONS     PERIPHERAL VASCULAR INTERVENTION  05/22/2018   Procedure: PERIPHERAL VASCULAR INTERVENTION;  Surgeon: Serafina Mitchell, MD;  Location: [facility];  Service: Cardiovascular;;  SMA and Celiac   PILONIDAL CYST EXCISION     POLYPECTOMY     THROMBECTOMY FEMORAL ARTERY Right 08/14/2017   Procedure:  THROMBECTOMY FEMORAL ARTERY;  Surgeon: Rosetta Posner, MD;  Location: [facility];  Service: Vascular;  Laterality: Right;   TONSILLECTOMY     TOTAL HIP ARTHROPLASTY  09/11/2015   Procedure: TOTAL HIP ARTHROPLASTY;  Surgeon: Renette Butters, MD;  Location: [facility];  Service: Orthopedics;;   ULTRASOUND GUIDANCE FOR VASCULAR ACCESS  10/13/2017   Procedure: Ultrasound Guidance For Vascular Access;  Surgeon: Nelva Bush, MD;  Location: [facility];  Service: Cardiovascular;;   VISCERAL ANGIOGRAPHY N/A 03/03/2020   Procedure: MESTENRIC ANGIOGRAPHY;  Surgeon: Serafina Mitchell, MD;  Location: [facility];  Service: Cardiovascular;  Laterality: N/A;    Prior to Admission medications   Medication Sig Start Date End Date Taking? Authorizing Provider  acetaminophen (TYLENOL) 500 MG tablet Take 1,000 mg by mouth daily as needed for headache (pain).    Yes [provider]  aspirin EC 81 MG tablet Take 81 mg by mouth daily.   Yes [provider]  Cholecalciferol (VITAMIN D) 2000 units tablet Take 2,000 Units by mouth daily.   Yes [provider]  clopidogrel (PLAVIX) 75 MG tablet TAKE 1 TABLET BY MOUTH EVERY DAY 01/04/22  Yes Waynetta Sandy, MD  dicyclomine (BENTYL) 10 MG capsule TAKE 1 CAPSULE BY MOUTH THREE TIMES A DAY BEFORE MEALS 02/21/22  Yes Ladene Artist, MD  DULoxetine (CYMBALTA) 20 MG capsule TAKE 2-3 CAPSULES BY MOUTH EVERY DAY 06/17/22  Yes Farrel Conners, MD  fluticasone [facility]) 50 MCG/ACT nasal spray Place 1 spray into both nostrils daily. 07/27/18  Yes Colin Benton R, DO  gabapentin (NEURONTIN) 600 MG tablet TAKE 1 TABLET BY MOUTH THREE TIMES DAILY GENERIC EQUIVALENT FOR NEURONTIN 01/04/22  Yes Koberlein, Steele Berg, MD  levothyroxine (SYNTHROID) 75 MCG tablet TAKE 1 TABLETBY MOUTH DAILY BEFORE BREAKFAST MONDAY-FRIDAY, THEN 1/2 SATURDAY AND SUNDAY 05/25/22  Yes Dutch Quint B, FNP  linaclotide (LINZESS) 72 MCG capsule Take 1 capsule (72 mcg total) by  mouth daily before breakfast. 03/16/21  Yes Ladene Artist, MD  metoprolol succinate (TOPROL-XL) 25 MG 24 hr tablet Take 1 tablet (25 mg total) by mouth 2 (two) times daily. 05/23/22  Yes Weaver, Scott T, PA-C  pantoprazole (PROTONIX) 40 MG tablet Take 1 tablet (40 mg total) by mouth 2 (two) times daily. 04/27/22  Yes Ladene Artist, MD  potassium chloride SA (KLOR-CON M) 20 MEQ tablet Take 20 mEq by mouth as needed (for fluid or edema).   Yes [provider]  rosuvastatin (CRESTOR) 40 MG tablet Take 1 tablet (40 mg total) by mouth daily. 05/30/22  Yes [name], MD  tiZANidine (ZANAFLEX) 4 MG tablet as needed. 12/11/20  Yes [provider]  vitamin B-12 1000 MCG tablet Take 1 tablet (1,000 mcg total) by mouth daily. 09/14/15  Yes Dhungel, Nishant, MD  acyclovir ointment (ZOVIRAX) 5 % Apply 1 application topically every 3 (three) hours as needed (fever blisters). 04/07/21   [name], MD  icosapent Ethyl (VASCEPA) 1 g capsule Take 2 capsules (2 g total) by mouth 2 (two) times daily. 06/14/22   Richardson Dopp  T, PA-C  meclizine (ANTIVERT) 25 MG tablet TAKE 1 TABLET BY MOUTH 3 TIMES DAILY AS NEEDED FOR DIZZINESS. 02/22/22   [name], MD  nitroGLYCERIN (NITROSTAT) 0.4 MG SL tablet Place 1 tablet (0.4 mg total) under the tongue every 5 (five) minutes as needed for chest pain. 11/02/17 05/23/22  End, Harrell Gave, MD  nystatin-triamcinolone (MYCOLOG II) cream Apply 1 application topically 2 (two) times daily as needed (dry skin).  03/30/16   [provider]    Current Outpatient Medications  Medication Sig Dispense Refill   acetaminophen (TYLENOL) 500 MG tablet Take 1,000 mg by mouth daily as needed for headache (pain).      aspirin EC 81 MG tablet Take 81 mg by mouth daily.     Cholecalciferol (VITAMIN D) 2000 units tablet Take 2,000 Units by mouth daily.     clopidogrel (PLAVIX) 75 MG tablet TAKE 1 TABLET BY MOUTH EVERY DAY 90 tablet 3   dicyclomine (BENTYL)  10 MG capsule TAKE 1 CAPSULE BY MOUTH THREE TIMES A DAY BEFORE MEALS 270 capsule 0   DULoxetine (CYMBALTA) 20 MG capsule TAKE 2-3 CAPSULES BY MOUTH EVERY DAY 270 capsule 0   fluticasone (FLONASE) 50 MCG/ACT nasal spray Place 1 spray into both nostrils daily. 48 g 1   gabapentin (NEURONTIN) 600 MG tablet TAKE 1 TABLET BY MOUTH THREE TIMES DAILY GENERIC EQUIVALENT FOR NEURONTIN 270 tablet 1   levothyroxine (SYNTHROID) 75 MCG tablet TAKE 1 TABLETBY MOUTH DAILY BEFORE BREAKFAST MONDAY-FRIDAY, THEN 1/2 SATURDAY AND SUNDAY 90 tablet 0   linaclotide (LINZESS) 72 MCG capsule Take 1 capsule (72 mcg total) by mouth daily before breakfast. 30 capsule 11   metoprolol succinate (TOPROL-XL) 25 MG 24 hr tablet Take 1 tablet (25 mg total) by mouth 2 (two) times daily. 60 tablet 3   pantoprazole (PROTONIX) 40 MG tablet Take 1 tablet (40 mg total) by mouth 2 (two) times daily. 60 tablet 11   potassium chloride SA (KLOR-CON M) 20 MEQ tablet Take 20 mEq by mouth as needed (for fluid or edema).     rosuvastatin (CRESTOR) 40 MG tablet Take 1 tablet (40 mg total) by mouth daily. 90 tablet 3   tiZANidine (ZANAFLEX) 4 MG tablet as needed.     vitamin B-12 1000 MCG tablet Take 1 tablet (1,000 mcg total) by mouth daily. 30 tablet 0   acyclovir ointment (ZOVIRAX) 5 % Apply 1 application topically every 3 (three) hours as needed (fever blisters). 15 g 3   icosapent Ethyl (VASCEPA) 1 g capsule Take 2 capsules (2 g total) by mouth 2 (two) times daily. 360 capsule 3   meclizine (ANTIVERT) 25 MG tablet TAKE 1 TABLET BY MOUTH 3 TIMES DAILY AS NEEDED FOR DIZZINESS. 90 tablet 1   nitroGLYCERIN (NITROSTAT) 0.4 MG SL tablet Place 1 tablet (0.4 mg total) under the tongue every 5 (five) minutes as needed for chest pain. 100 tablet 1   nystatin-triamcinolone (MYCOLOG II) cream Apply 1 application topically 2 (two) times daily as needed (dry skin).   2   Current Facility-Administered Medications  Medication Dose Route Frequency Provider  Last Rate Last Admin   0.9 %  sodium chloride infusion  500 mL Intravenous Once Ladene Artist, MD        Allergies as of 06/22/2022 - Review Complete 06/22/2022  Allergen Reaction Noted   Amitiza [lubiprostone] Diarrhea 06/06/2016   Nortriptyline Other (See Comments) 06/06/2016   Sulfa antibiotics Nausea And Vomiting and Other (See Comments) 03/29/2013  Tramadol Itching and Rash 12/01/2017   Atorvastatin Nausea And Vomiting 03/09/2015   Claritin [loratadine] Other (See Comments) 03/29/2013   Dexamethasone Itching 09/09/2013   Prednisone Rash 04/24/2013    Family History  Problem Relation Age of Onset   Arthritis Mother    Hyperlipidemia Mother    Heart disease Mother    Hypertension Mother    Stroke Mother 9   Irritable bowel syndrome Mother    Thyroid disease Mother    Heart attack Mother    Heart disease Father    Hyperlipidemia Father    Hypertension Father    Stroke Father 24   Thyroid disease Father    Prostate cancer Father    Heart attack Father    Lung cancer Brother 10   Heart attack Maternal Grandfather    Heart attack Maternal Uncle    Colon cancer Neg Hx    Rectal cancer Neg Hx    Stomach cancer Neg Hx    Esophageal cancer Neg Hx     Social History   Socioeconomic History   Marital status: Married    Spouse name: Not on file   Number of children: 0   Years of education: Not on file   Highest education level: Not on file  Occupational History   Occupation: caregiver  Tobacco Use   Smoking status: Former    Packs/day: 0.50    Types: Cigarettes    Quit date: 10/07/2014    Years since quitting: 7.7   Smokeless tobacco: Never  Vaping Use   Vaping Use: Never used  Substance and Sexual Activity   Alcohol use: No    Alcohol/week: 0.0 standard drinks of alcohol   Drug use: No   Sexual activity: Yes    Partners: Male  Other Topics Concern   Not on file  Social History Narrative   Work or School: homemaker      Home Situation: lives with  her husband,[name] takes care of her elderly parents       Spiritual Beliefs: Christian      Lifestyle: no regular exercise, poor diet      Right handed             Social Determinants of Health   Financial Resource Strain: Not on file  Food Insecurity: Not on file  Transportation Needs: Not on file  Physical Activity: Not on file  Stress: Not on file  Social Connections: Not on file  Intimate Partner Violence: Not on file    Review of Systems:  All systems reviewed were negative except where noted in HPI.   Physical Exam: General:  Alert, well-developed, in NAD Head:  Normocephalic and atraumatic. Eyes:  Sclera clear, no icterus.   Conjunctiva pink. Ears:  Normal auditory acuity. Mouth:  No deformity or lesions.  Neck:  Supple; no masses . Lungs:  Clear throughout to auscultation.   No wheezes, crackles, or rhonchi. No acute distress. Heart:  Regular rate and rhythm; no murmurs. Abdomen:  Soft, nondistended, nontender. No masses, hepatomegaly. No obvious masses.  Normal bowel .    Rectal:  Deferred   Msk:  Symmetrical without gross deformities.. Pulses:  Normal pulses noted. Extremities:  Without edema. Neurologic:  Alert and  oriented x4;  grossly normal neurologically. Skin:  Intact without significant lesions or rashes. Cervical Nodes:  No significant cervical adenopathy. Psych:  Alert and cooperative. Normal mood and affect.   Impression / Plan:   Personal history of adenomatous colon polyps, personal history of  anal cancer, dysphagia, abnormal esophagram for colonoscopy and EGD.  Pricilla Riffle. Fuller Plan  06/22/2022, 1:31 PM See Shea Evans, [facility], to contact our on call provider

## 2022-06-22 NOTE — Progress Notes (Signed)
Report given to PACU, vss 

## 2022-06-22 NOTE — Patient Instructions (Signed)
Colonoscopy:  Resume Plavix in 3 days. Refer to managing physician for further adjustment of therapy  Handout on colon polyps given to you today  Await pathology results on polyps removed     Upper Endoscopy:  Follow dilatation diet instructions given to you today- Resume prior diet tomorrow  Follow anti reflux measures long term- handout given to you     YOU HAD AN ENDOSCOPIC PROCEDURE TODAY AT [facility]:   Refer to the procedure report that was given to you for any specific questions about what was found during the examination.  If the procedure report does not answer your questions, please call your gastroenterologist to clarify.  If you requested that your care partner not be given the details of your procedure findings, then the procedure report has been included in a sealed envelope for you to review at your convenience later.  YOU SHOULD EXPECT: Some feelings of bloating in the abdomen. Passage of more gas than usual.  Walking can help get rid of the air that was put into your GI tract during the procedure and reduce the bloating. If you had a lower endoscopy (such as a colonoscopy or flexible sigmoidoscopy) you may notice spotting of blood in your stool or on the toilet paper. If you underwent a bowel prep for your procedure, you may not have a normal bowel movement for a few days.  Please Note:  You might notice some irritation and congestion in your nose or some drainage.  This is from the oxygen used during your procedure.  There is no need for concern and it should clear up in a day or so.  SYMPTOMS TO REPORT IMMEDIATELY:  Following lower endoscopy (colonoscopy or flexible sigmoidoscopy):  Excessive amounts of blood in the stool  Significant tenderness or worsening of abdominal pains  Swelling of the abdomen that is new, acute  Fever of 100F or higher  Following upper endoscopy (EGD)  Vomiting of blood or coffee ground material  New chest pain or  pain under the shoulder blades  Painful or persistently difficult swallowing  New shortness of breath  Fever of 100F or higher  Black, tarry-looking stools  For urgent or emergent issues, a gastroenterologist can be reached at any hour by calling [phone]. Do not use MyChart messaging for urgent concerns.    DIET:  We do recommend a small meal at first, but then you may proceed to your regular diet.  Drink plenty of fluids but you should avoid alcoholic beverages for 24 hours.  ACTIVITY:  You should plan to take it easy for the rest of today and you should NOT DRIVE or use heavy machinery until tomorrow (because of the sedation medicines used during the test).    FOLLOW UP: Our staff will call the number listed on your records the next business day following your procedure.  We will call around 7:15- 8:00 am to check on you and address any questions or concerns that you may have regarding the information given to you following your procedure. If we do not reach you, we will leave a message.  If you develop any symptoms (ie: fever, flu-like symptoms, shortness of breath, cough etc.) before then, please call [phone].  If you test positive for Covid 19 in the 2 weeks post procedure, please call and report this information to [place].    If any biopsies were taken you will be contacted by phone or by letter within the next 1-3 weeks.  Please  call us at [phone] if you have not heard about the biopsies in 3 weeks.    SIGNATURES/CONFIDENTIALITY: You and/or your care partner have signed paperwork which will be entered into your electronic medical record.  These signatures attest to the fact that that the information above on your After Visit Summary has been reviewed and is understood.  Full responsibility of the confidentiality of this discharge information lies with you and/or your care-partner.

## 2022-06-22 NOTE — Op Note (Addendum)
[facility] Patient Name: Erin [name] Procedure Date: 06/22/2022 1:36 PM MRN: [id number] Endoscopist: Ladene Artist , MD Age: 66 Referring MD:  Date of Birth: [birth year] Gender: Female Account #: [id number] Procedure:                Upper GI endoscopy Indications:              Dysphagia, Abnormal esophagram, GERD Medicines:                Monitored Anesthesia Care Procedure:                Pre-Anesthesia Assessment:                           - Prior to the procedure, a History and Physical                            was performed, and patient medications and                            allergies were reviewed. The patient's tolerance of                            previous anesthesia was also reviewed. The risks                            and benefits of the procedure and the sedation                            options and risks were discussed with the patient.                            All questions were answered, and informed consent                            was obtained. Prior Anticoagulants: The patient has                            taken Plavix (clopidogrel), last dose was 5 days                            prior to procedure. ASA Grade Assessment: III - A                            patient with severe systemic disease. After                            reviewing the risks and benefits, the patient was                            deemed in satisfactory condition to undergo the                            procedure.  After obtaining informed consent, the endoscope was                            passed under direct vision. Throughout the                            procedure, the patient's blood pressure, pulse, and                            oxygen saturations were monitored continuously. The                            GIF [id number] #[id number] was introduced through the                            mouth, and advanced to the second part of duodenum.                             The upper GI endoscopy was accomplished without                            difficulty. The patient tolerated the procedure                            well. Scope In: Scope Out: Findings:                 One benign-appearing, intrinsic, mild,                            non-obstructing stenosis was found at the                            gastroesophageal junction. This stenosis measured                            1.5 cm (inner diameter) x less than one cm (in                            length). The stenosis was traversed. A guidewire                            was placed and the scope was withdrawn. Dilation                            was performed with a Savary dilator with no                            resistance at 15 mm.                           The exam of the esophagus was otherwise normal. The                            proximal esophagus appeared  normal.                           A small hiatal hernia was present. Hill Grade II.                           The exam of the stomach was otherwise normal.                           The duodenal bulb and second portion of the                            duodenum were normal. Complications:            No immediate complications. Estimated Blood Loss:     Estimated blood loss was minimal. Impression:               - Benign-appearing non-obstructing esophageal                            stenosis. Dilated.                           - Small hiatal hernia.                           - Normal duodenal bulb and second portion of the                            duodenum.                           - No specimens collected. Recommendation:           - Patient has a contact number available for                            emergencies. The signs and symptoms of potential                            delayed complications were discussed with the                            patient. Return to normal activities tomorrow.                             Written discharge instructions were provided to the                            patient.                           - Clear liquid diet for 2 hours, then advance as                            tolerated to soft diet today.                           -  Resume prior diet tomorrow.                           - Continue present medications.                           - Follow antireflux measures long term.                           - Resume Plavix (clopidogrel) at prior dose in 3                            days. Refer to managing physician for further                            adjustment of therapy. Ladene Artist, MD 06/22/2022 2:33:50 PM This report has been signed electronically.

## 2022-06-22 NOTE — Progress Notes (Signed)
Called to room to assist during endoscopic procedure.  Patient ID and intended procedure confirmed with present staff. Received instructions for my participation in the procedure from the performing physician.  

## 2022-06-22 NOTE — Op Note (Signed)
[facility] Patient Name: [place] Procedure Date: 06/22/2022 1:36 PM MRN: [id number] Endoscopist: Ladene Artist , MD Age: 66 Referring MD:  Date of Birth: [birth year] Gender: Female Account #: [id number] Procedure:                Colonoscopy Indications:              Surveillance: Personal history of adenomatous                            polyps on last colonoscopy 3 years ago Medicines:                Monitored Anesthesia Care Procedure:                Pre-Anesthesia Assessment:                           - Prior to the procedure, a History and Physical                            was performed, and patient medications and                            allergies were reviewed. The patient's tolerance of                            previous anesthesia was also reviewed. The risks                            and benefits of the procedure and the sedation                            options and risks were discussed with the patient.                            All questions were answered, and informed consent                            was obtained. Prior Anticoagulants: The patient has                            taken Plavix (clopidogrel), last dose was 5 days                            prior to procedure. ASA Grade Assessment: III - A                            patient with severe systemic disease. After                            reviewing the risks and benefits, the patient was                            deemed in satisfactory condition to undergo the  procedure.                           After obtaining informed consent, the colonoscope                            was passed under direct vision. Throughout the                            procedure, the patient's blood pressure, pulse, and                            oxygen saturations were monitored continuously. The                            Olympus PCF-[id number] (#[id number]) Colonoscope was                             introduced through the anus and advanced to the the                            cecum, identified by appendiceal orifice and                            ileocecal valve. The ileocecal valve, appendiceal                            orifice, and rectum were photographed. The quality                            of the bowel preparation was good. The colonoscopy                            was somewhat difficult due to restricted mobility                            of the colon, significant looping and a tortuous                            colon. The patient tolerated the procedure well. Scope In: 1:38:43 PM Scope Out: 2:09:38 PM Scope Withdrawal Time: 0 hours 25 minutes 52 seconds  Total Procedure Duration: 0 hours 30 minutes 55 seconds  Findings:                 The digital rectal exam findings include deformity,                            scarring, perianal erythema.                           Nine sessile polyps were found in the transverse                            colon. The polyps were 6 to 10 mm in size. These  polyps were removed with a cold snare. Resection                            and retrieval were complete.                           A 3 mm polyp was found in the transverse colon. The                            polyp was sessile. The polyp was removed with a                            cold biopsy forceps. Resection and retrieval were                            complete.                           A tattoo was seen in the transverse colon. A                            post-polypectomy scar was found at the tattoo site.                           Five sessile polyps were found in the descending                            colon. The polyps were 5 to 8 mm in size. These                            polyps were removed with a cold snare. Resection                            and retrieval were complete.                           The exam was otherwise  without abnormality on                            direct and retroflexion views. Complications:            No immediate complications. Estimated blood loss:                            None. Estimated Blood Loss:     Estimated blood loss: none. Impression:               - Anal canal deformity, scarring, perianal eythema                            found on digital rectal exam.                           - Nine 6 to 10 mm polyps in the transverse colon,  removed with a cold snare. Resected and retrieved.                           - One 3 mm polyp in the transverse colon, removed                            with a cold biopsy forceps. Resected and retrieved.                           - A tattoo was seen in the transverse colon. A                            post-polypectomy scar was found at the tattoo site.                           - Five 5 to 8 mm polyps in the descending colon,                            removed with a cold snare. Resected and retrieved.                           - The examination was otherwise normal on direct                            and retroflexion views. Recommendation:           - Repeat colonoscopy after studies are complete for                            surveillance based on pathology results.                           - Resume Plavix (clopidogrel) in 3 days at prior                            dose. Refer to managing physician for further                            adjustment of therapy.                           - Patient has a contact number available for                            emergencies. The signs and symptoms of potential                            delayed complications were discussed with the                            patient. Return to normal activities tomorrow.                            Written discharge instructions  were provided to the                            patient.                           - Resume previous  diet.                           - Continue present medications.                           - Await pathology results. Ladene Artist, MD 06/22/2022 2:26:54 PM This report has been signed electronically.

## 2022-06-22 NOTE — Progress Notes (Signed)
1318 Robinul 0.1 mg IV given due large amount of secretions upon assessment.  MD made aware, vss

## 2022-06-23 ENCOUNTER — Telehealth: Payer: Self-pay | Admitting: *Deleted

## 2022-06-23 NOTE — Telephone Encounter (Signed)
Attempted f/u phone call. No answer. Left message. °

## 2022-06-25 ENCOUNTER — Other Ambulatory Visit: Payer: Self-pay | Admitting: Gastroenterology

## 2022-06-25 ENCOUNTER — Other Ambulatory Visit: Payer: Self-pay | Admitting: Physician Assistant

## 2022-06-25 ENCOUNTER — Other Ambulatory Visit: Payer: Self-pay | Admitting: Family

## 2022-06-25 DIAGNOSIS — R112 Nausea with vomiting, unspecified: Secondary | ICD-10-CM

## 2022-06-25 DIAGNOSIS — K2970 Gastritis, unspecified, without bleeding: Secondary | ICD-10-CM

## 2022-06-27 ENCOUNTER — Other Ambulatory Visit: Payer: Self-pay | Admitting: *Deleted

## 2022-06-27 MED ORDER — MECLIZINE HCL 25 MG PO TABS: ORAL_TABLET | ORAL | 0 refills | Status: DC

## 2022-06-27 MED ORDER — POTASSIUM CHLORIDE CRYS ER 20 MEQ PO TBCR: 20.0000 meq | EXTENDED_RELEASE_TABLET | ORAL | 0 refills | Status: DC | PRN

## 2022-06-27 NOTE — Telephone Encounter (Signed)
Ok to fill the antivert and potassium, pt needs a visit for the gabapentin since it is controlled

## 2022-07-09 ENCOUNTER — Encounter: Payer: Self-pay | Admitting: Gastroenterology

## 2022-07-18 NOTE — Progress Notes (Signed)
Patient ID: Erin [name]                 DOB: 1956/09/28                    MRN: [id number]     HPI: Erin [name] is a 66 y.o. female patient of Dr. Burt Knack, referred to lipid clinic by Richardson Dopp, PA-C. 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 Pt saw Scott 05/23/22 and lipid panel was obtained. Lipid panel came back with LDL 71 and TG 237 and pt started on Vascepa. Pt called clinic 06/16/22 saying she did not start Vascepa, as she was on this medication a year ago and this caused her to feel like her stomach was in knots which developed about 2 weeks after starting medication, and she had problems swallowing the medication as well.   Pt presents to lipid clinic today. Pt confirmed that she is not taking ezetimibe. She had only taken it for about 2 weeks and stopped taking it as it made her "stomach feel like it was in knots." She has been tolerating rosuvastatin 40 mg daily without issues. Pt shares that she was down to 87 lbs due to her GI condition but is back up to 120 lbs. She said that when her appetite was very poor it was due to her low potassium, but now she said her appetite is improved. She is prescribed potassium 20 mEq daily but only takes this about once a week since January of this year. Her most recent labs show normal potassium of 3.8.   Current Medications: rosuvastatin 40 mg daily Intolerances: atorvastatin (nausea/vomiting), Vascepa 2g BID (stomach upset), ezetimibe 10 mg daily (knot in stomach) Risk Factors: CAD, HLD ,T2DM LDL goal: <55  Diet:  Doesn't eat breakfast- 3 eggs, two slices of bacon, piece of toast (once every two weeks) Potatoes and gravy, green beans  Loves pinto beans but cooks in butter or ham hocks?  Salads  Chinese- beef and broccoli, pepper steak Sometimes gets [place] Steak about every 2 weeks   Doesn't eat much pasta or bread, garlic bread every now and then, toast every once in a while  using air fryer for chicken tater tots, does also deep fry chicken  Has had stomach trouble are her life but taking Linzess has helped Not big on sweets, once in a while Reese[employer] peanut butter cup. Husband bakes cookies and fudge and has a taste of it   Exercise:  Hates exercise Husband has stationary bike Legs are not strong right now, Mom recently past away she was care giver  Family History: mother- HLD, heart disease,stroke,MI; father- heart disease, HLD, HTN, stroke, thyroid disease  Social History: former smoker, no alcohol   Labs: 05/23/22 TC 147, TG 237, HDL 37, LDL 71 (rosuvastatin 40 mg daily)   Past Medical History:  Diagnosis Date   Allergy    Alopecia    Anal cancer ([place]) 08/14/13   invasive squamous cell ca, s/p radiation 10/20-11/26/14 60.4Gy/53f and chemo   Anxiety    Aortoiliac occlusive disease ([place] 08/14/2017   B12 deficiency anemia 09/14/2015   Blood transfusion without reported diagnosis    BPPV (benign paroxysmal positional vertigo) 07/08/2015   Cardiomyopathy ([place] 11/03/2017   Chronic back pain    Chronic daily headache 03/29/2013   takes bc powder   Closed  right hip fracture ([place]) 09/10/2015   Coronary artery disease involving native coronary artery of native heart without angina pectoris 10/13/2017   DES to mid RCA   Depression    Essential (hemorrhagic) thrombocythemia ([place]) 09/06/2018   Family history of early CAD 04/08/2016   GERD (gastroesophageal reflux disease)    History of hiatal hernia    Hot flashes    Hyperlipidemia    Hyperlipidemia associated with type 2 diabetes mellitus ([place]) 05/11/2017   Hypothyroidism 09/10/2015   IBS (irritable bowel syndrome) 03/29/2013   Neck pain 07/08/2015   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   takes neurotin   Peripheral vascular disease ([place]) 11/03/2017   QT prolongation    Sacroiliitis ([place]) 09/06/2018   Spasms of the hands  or feet 10/08/2017   Syncope 06/07/2016   Tubular adenoma of colon 09/08/2003   Vertigo    Wears glasses     Current Outpatient Medications on File Prior to Visit  Medication Sig Dispense Refill   acetaminophen (TYLENOL) 500 MG tablet Take 1,000 mg by mouth daily as needed for headache (pain).      acyclovir ointment (ZOVIRAX) 5 % Apply 1 application topically every 3 (three) hours as needed (fever blisters). 15 g 3   aspirin EC 81 MG tablet Take 81 mg by mouth daily.     Cholecalciferol (VITAMIN D) 2000 units tablet Take 2,000 Units by mouth daily.     clopidogrel (PLAVIX) 75 MG tablet TAKE 1 TABLET BY MOUTH EVERY DAY 90 tablet 3   dicyclomine (BENTYL) 10 MG capsule TAKE 1 CAPSULE BY MOUTH THREE TIMES A DAY BEFORE MEALS 270 capsule 0   DULoxetine (CYMBALTA) 20 MG capsule TAKE 2-3 CAPSULES BY MOUTH EVERY DAY 270 capsule 0   fluticasone (FLONASE) 50 MCG/ACT nasal spray Place 1 spray into both nostrils daily. 48 g 1   gabapentin (NEURONTIN) 600 MG tablet TAKE 1 TABLET BY MOUTH THREE TIMES DAILY GENERIC EQUIVALENT FOR NEURONTIN 270 tablet 1   icosapent Ethyl (VASCEPA) 1 g capsule Take 2 capsules (2 g total) by mouth 2 (two) times daily. 360 capsule 3   levothyroxine (SYNTHROID) 75 MCG tablet TAKE 1 TABLETBY MOUTH DAILY BEFORE BREAKFAST MONDAY-FRIDAY, THEN 1/2 SATURDAY AND SUNDAY 90 tablet 0   LINZESS 72 MCG capsule TAKE 1 CAPSULE BY MOUTH DAILY BEFORE BREAKFAST. 30 capsule 10   meclizine (ANTIVERT) 25 MG tablet TAKE 1 TABLET BY MOUTH 3 TIMES DAILY AS NEEDED FOR DIZZINESS. 90 tablet 0   metoprolol succinate (TOPROL-XL) 25 MG 24 hr tablet TAKE 1 TABLET BY MOUTH TWICE A DAY 180 tablet 3   nitroGLYCERIN (NITROSTAT) 0.4 MG SL tablet Place 1 tablet (0.4 mg total) under the tongue every 5 (five) minutes as needed for chest pain. 100 tablet 1   nystatin-triamcinolone (MYCOLOG II) cream Apply 1 application topically 2 (two) times daily as needed (dry skin).   2   pantoprazole (PROTONIX) 40 MG tablet Take  1 tablet (40 mg total) by mouth 2 (two) times daily. 60 tablet 11   potassium chloride SA (KLOR-CON M) 20 MEQ tablet Take 1 tablet (20 mEq total) by mouth as needed (for fluid or edema). 90 tablet 0   rosuvastatin (CRESTOR) 40 MG tablet Take 1 tablet (40 mg total) by mouth daily. 90 tablet 3   tiZANidine (ZANAFLEX) 4 MG tablet as needed.     vitamin B-12 1000 MCG tablet Take 1 tablet (1,000 mcg total) by mouth daily. 30 tablet 0   No current facility-administered medications on file prior  to visit.    Allergies  Allergen Reactions   Amitiza [Lubiprostone] Diarrhea    Uncontrollable diarrhea   Nortriptyline Other (See Comments)    Stomach distention   Sulfa Antibiotics Nausea And Vomiting and Other (See Comments)    GI distress/pain   Tramadol Itching and Rash   Atorvastatin Nausea And Vomiting   Claritin [Loratadine] Other (See Comments)    Hot flashes   Dexamethasone Itching   Prednisone Rash    Assessment/Plan:  1. Hyperlipidemia - Triglycerides of 237 are above goal of <150. Discussed that fenofibrate would benefit patient through lowering triglycerides and preventing microvascular complications of diabetes such as peripheral neuropathy and retinopathy. Given multiple intolerances due to abdominal pain on other medications, reviewed with patient that in trials incidence of abdominal pain was only 0.2% higher in fenofibrate vs placebo. Pt is agreeable to try fenofibrate. Will start fenofibrate 160 mg daily with food. Printed out patient resources from [employer] for dietary recommendations. Patient is scheduled for follow-up fasting lipid panel and LFTs on 10/25. Discussed importance of limiting refined carbohydrates in diet and emphasizing lean proteins and vegetables. We will call patient to discuss those results and address any medication changes if necessary.   Thank you,  Eliseo Gum, PharmD PGY1 Pharmacy Resident   07/19/2022  4:46 PM   Ramond Dial, Pharm.D, BCPS, CPP [facility]  [address], [place], [place] [address]  Phone: [phone]; Fax: [phone]

## 2022-07-19 ENCOUNTER — Ambulatory Visit: Payer: Medicare Other | Attending: Cardiology | Admitting: Pharmacist

## 2022-07-19 DIAGNOSIS — I2510 Atherosclerotic heart disease of native coronary artery without angina pectoris: Secondary | ICD-10-CM

## 2022-07-19 DIAGNOSIS — E7800 Pure hypercholesterolemia, unspecified: Secondary | ICD-10-CM

## 2022-07-19 MED ORDER — FENOFIBRATE 160 MG PO TABS: 160.0000 mg | ORAL_TABLET | Freq: Every day | ORAL | 11 refills | Status: DC

## 2022-07-19 NOTE — Patient Instructions (Addendum)
Your triglycerides are 237. We would like them to be <150.  Start taking fenofibrate 160 mg daily with food.   Continue taking rosuvastatin 40 mg daily.  You have follow-up fasting lipid labs scheduled on 10/25.   Using the exercise bike 15 minutes a day and walking after dinner and increase as able.  Continue working on drinking more water and drinking less soda. Try limiting foods that are higher in carbohydrates (pasta, bread, potatoes), and eating more lean proteins and vegetables.

## 2022-07-27 ENCOUNTER — Other Ambulatory Visit: Payer: Self-pay | Admitting: Family Medicine

## 2022-08-04 ENCOUNTER — Encounter: Payer: Medicare Other | Admitting: Family Medicine

## 2022-08-05 ENCOUNTER — Encounter: Payer: Medicare Other | Admitting: Family Medicine

## 2022-08-19 DIAGNOSIS — M25552 Pain in left hip: Secondary | ICD-10-CM | POA: Diagnosis not present

## 2022-08-26 ENCOUNTER — Other Ambulatory Visit: Payer: Medicare Other

## 2022-08-29 ENCOUNTER — Other Ambulatory Visit: Payer: Self-pay | Admitting: Family Medicine

## 2022-08-29 DIAGNOSIS — M545 Low back pain, unspecified: Secondary | ICD-10-CM | POA: Diagnosis not present

## 2022-08-29 DIAGNOSIS — M25552 Pain in left hip: Secondary | ICD-10-CM | POA: Diagnosis not present

## 2022-09-01 ENCOUNTER — Encounter: Payer: Self-pay | Admitting: Family Medicine

## 2022-09-01 ENCOUNTER — Ambulatory Visit (INDEPENDENT_AMBULATORY_CARE_PROVIDER_SITE_OTHER): Payer: Medicare Other | Admitting: Family Medicine

## 2022-09-01 ENCOUNTER — Other Ambulatory Visit (HOSPITAL_COMMUNITY)
Admission: RE | Admit: 2022-09-01 | Discharge: 2022-09-01 | Disposition: A | Discharge status: Home / Self Care | Payer: Medicare Other | Source: Ambulatory Visit | Attending: Family Medicine | Admitting: Family Medicine

## 2022-09-01 VITALS — BP 130/82 | HR 65 | Temp 99.0°F | Ht 63.0 in | Wt 110.9 lb

## 2022-09-01 DIAGNOSIS — E039 Hypothyroidism, unspecified: Secondary | ICD-10-CM | POA: Diagnosis not present

## 2022-09-01 DIAGNOSIS — N898 Other specified noninflammatory disorders of vagina: Secondary | ICD-10-CM

## 2022-09-01 DIAGNOSIS — Z23 Encounter for immunization: Secondary | ICD-10-CM

## 2022-09-01 DIAGNOSIS — J309 Allergic rhinitis, unspecified: Secondary | ICD-10-CM | POA: Diagnosis not present

## 2022-09-01 DIAGNOSIS — G63 Polyneuropathy in diseases classified elsewhere: Secondary | ICD-10-CM

## 2022-09-01 DIAGNOSIS — I739 Peripheral vascular disease, unspecified: Secondary | ICD-10-CM

## 2022-09-01 DIAGNOSIS — R252 Cramp and spasm: Secondary | ICD-10-CM

## 2022-09-01 MED ORDER — FLUTICASONE PROPIONATE 50 MCG/ACT NA SUSP: 1.0000 | Freq: Every day | NASAL | 1 refills | Status: DC

## 2022-09-01 MED ORDER — DULOXETINE HCL 20 MG PO CPEP: ORAL_CAPSULE | ORAL | 1 refills | Status: DC

## 2022-09-01 MED ORDER — GABAPENTIN 600 MG PO TABS: ORAL_TABLET | ORAL | 1 refills | Status: DC

## 2022-09-01 NOTE — Progress Notes (Unsigned)
Established Patient Office Visit  Subjective   Patient ID: Erin [name], female    DOB: 1956-01-14  Age: 66 y.o. MRN: [id number]  Chief Complaint  Patient presents with  . Establish Care  . Medication Refill    Patient requests a refill on Flonase  . patient complains of cramps in back and legs    Patient is here for transition of care  HLD-- Patient is on rosuvastatin 40 mg daily. States that she had side effects on the vascepa, states she is not taking the zetia, also has not started the fenofibrate yet-- states she made dietary changes and wants to see if that will lower her TG level. States she used to drink 2-3 regular sodas daily and has since stopped doing this. Is scheduled for follow up on 10/25 and has labs already scheduled.   Patient is complaining of "foul odor" of her vaginal area for years. States it started after she had radiation therapy for her anal cancer. States that she has tried diflucan in the past which has not improved the smell. No discharge just odor. States that it is a "fishy" smell. No fever/chills.   Current Outpatient Medications  Medication Instructions  . acetaminophen (TYLENOL) 1,000 mg, Oral, Daily PRN  . acyclovir ointment (ZOVIRAX) 5 % 1 application , Topical, Every  3 hours PRN  . aspirin EC 81 mg, Oral, Daily  . clopidogrel (PLAVIX) 75 MG tablet TAKE 1 TABLET BY MOUTH EVERY DAY  . cyanocobalamin 1,000 mcg, Oral, Daily  . dicyclomine (BENTYL) 10 MG capsule TAKE 1 CAPSULE BY MOUTH THREE TIMES A DAY BEFORE MEALS  . DULoxetine (CYMBALTA) 20 MG capsule TAKE 2-3 CAPSULES BY MOUTH EVERY DAY  . fluticasone (FLONASE) 50 MCG/ACT nasal spray 1 spray, Each Nare, Daily  . gabapentin (NEURONTIN) 600 MG tablet TAKE 1 TABLET BY MOUTH THREE TIMES DAILY GENERIC EQUIVALENT FOR NEURONTIN  . levothyroxine (SYNTHROID) 75 MCG tablet TAKE 1 TABLETBY MOUTH DAILY BEFORE BREAKFAST MONDAY-FRIDAY, THEN 1/2 SATURDAY AND SUNDAY  . LINZESS 72 MCG capsule TAKE 1 CAPSULE  BY MOUTH DAILY BEFORE BREAKFAST.  [place] Kitchen meclizine (ANTIVERT) 25 MG tablet TAKE 1 TABLET BY MOUTH THREE TIMES A DAY AS NEEDED FOR DIZZINESS  . metoprolol succinate (TOPROL-XL) 25 mg, Oral, 2 times daily  . nitroGLYCERIN (NITROSTAT) 0.4 mg, Sublingual, Every 5 min PRN  . nystatin-triamcinolone (MYCOLOG II) cream 1 application , Topical, 2 times daily PRN  . pantoprazole (PROTONIX) 40 mg, Oral, 2 times daily  . potassium chloride SA (KLOR-CON M) 20 MEQ tablet 20 mEq, Oral, As needed  . rosuvastatin (CRESTOR) 40 mg, Oral, Daily  . tiZANidine (ZANAFLEX) 4 MG tablet As needed  . Vitamin D 2,000 Units, Oral, Daily    Patient Active Problem List   Diagnosis Date Noted  . Preoperative cardiovascular examination 05/10/2022  . 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  . 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 PAD (peripheral artery disease) ([place]) 03/03/2020  . Hyperlipidemia 02/12/2020  . Cardiomyopathy ([place]) 11/03/2017  . Peripheral vascular disease ([place]) 11/03/2017  . Coronary artery disease involving native coronary artery of native heart without angina pectoris 10/13/2017  . Aortoiliac occlusive disease ([place]) 08/14/2017  . Hypokalemia 06/06/2016  . Family history of early CAD 04/08/2016  . Elevated glucose 03/18/2016  . B12 deficiency anemia 09/14/2015  . Hypothyroidism 09/10/2015  . GERD without esophagitis 09/10/2015  . BPPV (benign paroxysmal positional vertigo) 07/08/2015  . Neck pain 07/08/2015  . Anal cancer ([place]) 08/19/2013  .  IBS (irritable bowel syndrome) 03/29/2013  . Chronic daily headache 03/29/2013  . 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      Review of Systems  All other systems reviewed and are negative.     Objective:     BP 130/82 (BP Location: Left Arm, Patient Position: Sitting, Cuff Size: Normal)   Pulse 65   Temp 99 F (37.2 C) (Oral)   Ht '5\' 3"'$  (1.6 m)   Wt 110 lb 14.4 oz (50.3 kg)   SpO2 98%   BMI 19.65 kg/m  {Vitals History  (Optional):23777}  Physical Exam Vitals reviewed.  Constitutional:      Appearance: Normal appearance. She is well-groomed and normal weight.  Eyes:     Conjunctiva/sclera: Conjunctivae normal.  Neck:     Thyroid: No thyromegaly.  Cardiovascular:     Rate and Rhythm: Normal rate and regular rhythm.     Pulses: Normal pulses.     Heart sounds: S1 normal and S2 normal.  Pulmonary:     Effort: Pulmonary effort is normal.     Breath sounds: Normal breath sounds and air entry.  Abdominal:     General: Bowel sounds are normal.  Musculoskeletal:     Right lower leg: No edema.     Left lower leg: No edema.  Neurological:     Mental Status: She is alert and oriented to person, place, and time. Mental status is at baseline.     Gait: Gait is intact.  Psychiatric:        Mood and Affect: Mood and affect normal.        Speech: Speech normal.        Behavior: Behavior normal.        Judgment: Judgment normal.     No results found for any visits on 09/01/22.  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    Farrel Conners, MD

## 2022-09-01 NOTE — Patient Instructions (Signed)
You can try magnesium supplements to help the muscle cramps OR try Co Enzyme Q10-- this might help if the muscle cramps are from the cholesterol medication.

## 2022-09-02 ENCOUNTER — Telehealth: Payer: Self-pay | Admitting: Family Medicine

## 2022-09-02 ENCOUNTER — Other Ambulatory Visit (HOSPITAL_COMMUNITY)
Admission: RE | Admit: 2022-09-02 | Discharge: 2022-09-02 | Disposition: A | Discharge status: Home / Self Care | Payer: Medicare Other | Source: Ambulatory Visit | Attending: Family Medicine | Admitting: Family Medicine

## 2022-09-02 DIAGNOSIS — I2510 Atherosclerotic heart disease of native coronary artery without angina pectoris: Secondary | ICD-10-CM | POA: Insufficient documentation

## 2022-09-02 DIAGNOSIS — R252 Cramp and spasm: Secondary | ICD-10-CM

## 2022-09-02 DIAGNOSIS — Z79899 Other long term (current) drug therapy: Secondary | ICD-10-CM | POA: Diagnosis not present

## 2022-09-02 DIAGNOSIS — E039 Hypothyroidism, unspecified: Secondary | ICD-10-CM | POA: Diagnosis not present

## 2022-09-02 DIAGNOSIS — E781 Pure hyperglyceridemia: Secondary | ICD-10-CM | POA: Diagnosis not present

## 2022-09-02 LAB — HEPATIC FUNCTION PANEL
ALT: 12 U/L (ref 0–44)
AST: 21 U/L (ref 15–41)
Albumin: 4.3 g/dL (ref 3.5–5.0)
Alkaline Phosphatase: 71 U/L (ref 38–126)
Bilirubin, Direct: 0.1 mg/dL (ref 0.0–0.2)
Indirect Bilirubin: 0.4 mg/dL (ref 0.3–0.9)
Total Bilirubin: 0.5 mg/dL (ref 0.3–1.2)
Total Protein: 7.3 g/dL (ref 6.5–8.1)

## 2022-09-02 LAB — LIPID PANEL
Cholesterol: 149 mg/dL (ref 0–200)
HDL: 66 mg/dL (ref >40)
LDL Cholesterol: 66 mg/dL (ref 0–99)
Total CHOL/HDL Ratio: 2.3 RATIO
Triglycerides: 86 mg/dL (ref <150)
VLDL: 17 mg/dL (ref 0–40)

## 2022-09-02 LAB — TSH: TSH: 1.483 u[IU]/mL (ref 0.350–4.500)

## 2022-09-02 NOTE — Telephone Encounter (Signed)
Patient requesting a full panel blood draw to [facility] including potassium and magnesium on account of her cramping. Past history of hospitalization because of low potassium. Pt requesting a call to notify when orders are placed

## 2022-09-02 NOTE — Telephone Encounter (Signed)
Pt is calling and she is aware of TSH results however pt said the hospital took extra tubes of blood and thought they was suppose to check her potassium and magnesium

## 2022-09-02 NOTE — Progress Notes (Signed)
Normal TSH

## 2022-09-05 LAB — CERVICOVAGINAL ANCILLARY ONLY
Bacterial Vaginitis (gardnerella): NEGATIVE
Candida Glabrata: NEGATIVE
Candida Vaginitis: NEGATIVE
Comment: NEGATIVE
Comment: NEGATIVE
Comment: NEGATIVE
Comment: NEGATIVE
Trichomonas: NEGATIVE

## 2022-09-05 NOTE — Telephone Encounter (Signed)
BMP and magnesium level please!

## 2022-09-05 NOTE — Telephone Encounter (Signed)
Message sent to PCP for the exact name of lab tests requested.

## 2022-09-05 NOTE — Assessment & Plan Note (Signed)
On cymbalta 20 mg 2 capsules daily ad gabapentin 3 times daily, patient reports her symptoms are well controlled on this medication, will continue as prescribed. rx's written today.

## 2022-09-05 NOTE — Progress Notes (Signed)
Nuswab is negative for yeast and BV, it might be atrophic vaginitis-- would she like a referral to GYN?

## 2022-09-05 NOTE — Telephone Encounter (Signed)
External lab orders entered as below.  Left a message for the patient to return my call.

## 2022-09-05 NOTE — Assessment & Plan Note (Signed)
On 75 mcg daily, pt reports good control of her symptoms on this dose, she needs a new TSH today.

## 2022-09-05 NOTE — Telephone Encounter (Signed)
Ok to place orders to [facility] for muscle cramps

## 2022-09-06 ENCOUNTER — Telehealth: Payer: Self-pay | Admitting: *Deleted

## 2022-09-06 NOTE — Telephone Encounter (Signed)
Spoke with the patient and informed her the orders were entered as below.  Patient was advised to contact the lab at [facility] for an appt and is aware she will be contacted once results are received.

## 2022-09-06 NOTE — Patient Outreach (Signed)
  Care Coordination   09/06/2022 Name: Erin [name] MRN: [id number] DOB: 07-Sep-1956   Care Coordination Outreach Attempts:  An unsuccessful telephone outreach was attempted today to offer the patient information about available care coordination services as a benefit of their health plan.   Follow Up Plan:  Additional outreach attempts will be made to offer the patient care coordination information and services.   Encounter Outcome:  No Answer  Care Coordination Interventions Activated:  No   Care Coordination Interventions:  No, not indicated    Raina Mina, RN Care Management Coordinator [place] Office [phone]

## 2022-09-07 ENCOUNTER — Telehealth: Payer: Self-pay | Admitting: Cardiovascular Disease

## 2022-09-07 ENCOUNTER — Telehealth: Payer: Self-pay | Admitting: Family Medicine

## 2022-09-07 NOTE — Progress Notes (Signed)
Pt has been made aware of normal result and verbalized understanding.  jw

## 2022-09-07 NOTE — Telephone Encounter (Signed)
Returned call back and she has been made aware of her lab results.

## 2022-09-07 NOTE — Telephone Encounter (Signed)
Left message for patient to call back and schedule Medicare Annual Wellness Visit (AWV) either virtually or in office. Left  my Erin [name] number [phone]   *due 08/21/2022 awvi per [facility] please schedule with Nurse Health Adviser   45 min for awv-i and in office appointments 30 min for awv-s  phone/virtual appointments

## 2022-09-07 NOTE — Telephone Encounter (Signed)
Patient was returning call. Please advise ?

## 2022-09-08 NOTE — Addendum Note (Signed)
Addended by: Farrel Conners on: 09/08/2022 09:41 AM   Modules accepted: Orders

## 2022-09-12 ENCOUNTER — Ambulatory Visit (INDEPENDENT_AMBULATORY_CARE_PROVIDER_SITE_OTHER): Payer: Medicare Other

## 2022-09-12 VITALS — Ht 63.0 in | Wt 110.0 lb

## 2022-09-12 DIAGNOSIS — Z0000 Encounter for general adult medical examination without abnormal findings: Secondary | ICD-10-CM

## 2022-09-12 NOTE — Patient Instructions (Addendum)
[name] , Thank you for taking time to come for your Medicare Wellness Visit. I appreciate your ongoing commitment to your health goals. Please review the following plan we discussed and let me know if I can assist you in the future.   These are the goals we discussed:  Goals       No Current Goals (pt-stated)        This is a list of the screening recommended for you and due dates:  Health Maintenance  Topic Date Due   Mammogram  08/08/2020   Yearly kidney health urinalysis for diabetes  02/11/2021   DEXA scan (bone density measurement)  Never done   Hemoglobin A1C  06/09/2022   COVID-19 Vaccine (3 - Moderna risk series) 09/17/2022*   Eye exam for diabetics  12/02/2022*   Zoster (Shingles) Vaccine (1 of 2) 12/02/2022*   Flu Shot  02/19/2023*   Tetanus Vaccine  03/08/2025*   Yearly kidney function blood test for diabetes  05/24/2023   Colon Cancer Screening  06/23/2023   Pneumonia Vaccine  Completed   Hepatitis C Screening: USPSTF Recommendation to screen - Ages 33-79 yo.  Addressed   HPV Vaccine  Aged Out   Complete foot exam   Discontinued  *Topic was postponed. The date shown is not the original due date.    Advanced directives: Advance directive discussed with you today. Even though you declined this today, please call our office should you change your mind, and we can give you the proper paperwork for you to fill out.   Conditions/risks identified: None  Next appointment: Follow up in one year for your annual wellness visit     Preventive Care 65 Years and Older, Female Preventive care refers to lifestyle choices and visits with your health care provider that can promote health and wellness. What does preventive care include? A yearly physical exam. This is also called an annual well check. Dental exams once or twice a year. Routine eye exams. Ask your health care provider how often you should have your eyes checked. Personal lifestyle choices, including: Daily  care of your teeth and gums. Regular physical activity. Eating a healthy diet. Avoiding tobacco and drug use. Limiting alcohol use. Practicing safe sex. Taking low-dose aspirin every day. Taking vitamin and mineral supplements as recommended by your health care provider. What happens during an annual well check? The services and screenings done by your health care provider during your annual well check will depend on your age, overall health, lifestyle risk factors, and family history of disease. Counseling  Your health care provider may ask you questions about your: Alcohol use. Tobacco use. Drug use. Emotional well-being. Home and relationship well-being. Sexual activity. Eating habits. History of falls. Memory and ability to understand (cognition). Work and work Statistician. Reproductive health. Screening  You may have the following tests or measurements: Height, weight, and BMI. Blood pressure. Lipid and cholesterol levels. These may be checked every 5 years, or more frequently if you are over 4 years old. Skin check. Lung cancer screening. You may have this screening every year starting at age 57 if you have a 30-pack-year history of smoking and currently smoke or have quit within the past 15 years. Fecal occult blood test (FOBT) of the stool. You may have this test every year starting at age 51. Flexible sigmoidoscopy or colonoscopy. You may have a sigmoidoscopy every 5 years or a colonoscopy every 10 years starting at age 60. Hepatitis C blood test. Hepatitis B  blood test. Sexually transmitted disease (STD) testing. Diabetes screening. This is done by checking your blood sugar (glucose) after you have not eaten for a while (fasting). You may have this done every 1-3 years. Bone density scan. This is done to screen for osteoporosis. You may have this done starting at age 64. Mammogram. This may be done every 1-2 years. Talk to your health care provider about how often you  should have regular mammograms. Talk with your health care provider about your test results, treatment options, and if necessary, the need for more tests. Vaccines  Your health care provider may recommend certain vaccines, such as: Influenza vaccine. This is recommended every year. Tetanus, diphtheria, and acellular pertussis (Tdap, Td) vaccine. You may need a Td booster every 10 years. Zoster vaccine. You may need this after age 78. Pneumococcal 13-valent conjugate (PCV13) vaccine. One dose is recommended after age 21. Pneumococcal polysaccharide (PPSV23) vaccine. One dose is recommended after age 39. Talk to your health care provider about which screenings and vaccines you need and how often you need them. This information is not intended to replace advice given to you by your health care provider. Make sure you discuss any questions you have with your health care provider. Document Released: 12/04/2015 Document Revised: 07/27/2016 Document Reviewed: 09/08/2015 Elsevier Interactive Patient Education  2017 [employer] Prevention in the Home Falls can cause injuries. They can happen to people of all ages. There are many things you can do to make your home safe and to help prevent falls. What can I do on the outside of my home? Regularly fix the edges of walkways and driveways and fix any cracks. Remove anything that might make you trip as you walk through a door, such as a raised step or threshold. Trim any bushes or trees on the path to your home. Use bright outdoor lighting. Clear any walking paths of anything that might make someone trip, such as rocks or tools. Regularly check to see if handrails are loose or broken. Make sure that both sides of any steps have handrails. Any raised decks and porches should have guardrails on the edges. Have any leaves, snow, or ice cleared regularly. Use sand or salt on walking paths during winter. Clean up any spills in your garage right away.  This includes oil or grease spills. What can I do in the bathroom? Use night lights. Install grab bars by the toilet and in the tub and shower. Do not use towel bars as grab bars. Use non-skid mats or decals in the tub or shower. If you need to sit down in the shower, use a plastic, non-slip stool. Keep the floor dry. Clean up any water that spills on the floor as soon as it happens. Remove soap buildup in the tub or shower regularly. Attach bath mats securely with double-sided non-slip rug tape. Do not have throw rugs and other things on the floor that can make you trip. What can I do in the bedroom? Use night lights. Make sure that you have a light by your bed that is easy to reach. Do not use any sheets or blankets that are too big for your bed. They should not hang down onto the floor. Have a firm chair that has side arms. You can use this for support while you get dressed. Do not have throw rugs and other things on the floor that can make you trip. What can I do in the kitchen? Clean up any spills  right away. Avoid walking on wet floors. Keep items that you use a lot in easy-to-reach places. If you need to reach something above you, use a strong step stool that has a grab bar. Keep electrical cords out of the way. Do not use floor polish or wax that makes floors slippery. If you must use wax, use non-skid floor wax. Do not have throw rugs and other things on the floor that can make you trip. What can I do with my stairs? Do not leave any items on the stairs. Make sure that there are handrails on both sides of the stairs and use them. Fix handrails that are broken or loose. Make sure that handrails are as long as the stairways. Check any carpeting to make sure that it is firmly attached to the stairs. Fix any carpet that is loose or worn. Avoid having throw rugs at the top or bottom of the stairs. If you do have throw rugs, attach them to the floor with carpet tape. Make sure that  you have a light switch at the top of the stairs and the bottom of the stairs. If you do not have them, ask someone to add them for you. What else can I do to help prevent falls? Wear shoes that: Do not have high heels. Have rubber bottoms. Are comfortable and fit you well. Are closed at the toe. Do not wear sandals. If you use a stepladder: Make sure that it is fully opened. Do not climb a closed stepladder. Make sure that both sides of the stepladder are locked into place. Ask someone to hold it for you, if possible. Clearly mark and make sure that you can see: Any grab bars or handrails. First and last steps. Where the edge of each step is. Use tools that help you move around (mobility aids) if they are needed. These include: Canes. Walkers. Scooters. Crutches. Turn on the lights when you go into a dark area. Replace any light bulbs as soon as they burn out. Set up your furniture so you have a clear path. Avoid moving your furniture around. If any of your floors are uneven, fix them. If there are any pets around you, be aware of where they are. Review your medicines with your doctor. Some medicines can make you feel dizzy. This can increase your chance of falling. Ask your doctor what other things that you can do to help prevent falls. This information is not intended to replace advice given to you by your health care provider. Make sure you discuss any questions you have with your health care provider. Document Released: 09/03/2009 Document Revised: 04/14/2016 Document Reviewed: 12/12/2014 Elsevier Interactive Patient Education  2017 [employer].

## 2022-09-12 NOTE — Progress Notes (Signed)
Subjective:   Erin [name] is a 66 y.o. female who presents for Medicare Annual (Subsequent) preventive examination.  Review of Systems    Virtual Visit via Telephone Note  I connected with  Erin [name] on 09/12/22 at 11:15 AM EDT by telephone and verified that I am speaking with the correct person using two identifiers.  Location: Patient: Home Provider: Office Persons participating in the virtual visit: patient/Nurse Health Advisor   I discussed the limitations, risks, security and privacy concerns of performing an evaluation and management service by telephone and the availability of in person appointments. The patient expressed understanding and agreed to proceed.  Interactive audio and video telecommunications were attempted between this nurse and patient, however failed, due to patient having technical difficulties OR patient did not have access to video capability.  We continued and completed visit with audio only.  Some vital signs may be absent or patient reported.   Criselda Peaches, LPN  Cardiac Risk Factors include: advanced age (>40mn, >>54women)     Objective:    Today's Vitals   09/12/22 1125  Weight: 110 lb (49.9 kg)  Height: '5\' 3"'$  (1.6 m)   Body mass index is 19.49 kg/m.     09/12/2022   11:33 AM 03/03/2020    8:53 AM 02/25/2020   11:26 AM 12/16/2019    9:26 AM 05/22/2018    5:58 AM 10/08/2017   11:16 PM 10/08/2017    5:36 PM  Advanced Directives  Does Patient Have a Medical Advance Directive? No No No No No No No  Would patient like information on creating a medical advance directive? No - Patient declined Yes (MAU/Ambulatory/Procedural Areas - Information given) No - Patient declined  Yes (MAU/Ambulatory/Procedural Areas - Information given) No - Patient declined     Current Medications (verified) Outpatient Encounter Medications as of 09/12/2022  Medication Sig   acetaminophen (TYLENOL) 500 MG tablet Take 1,000 mg by mouth daily as  needed for headache (pain).    acyclovir ointment (ZOVIRAX) 5 % Apply 1 application topically every 3 (three) hours as needed (fever blisters).   aspirin EC 81 MG tablet Take 81 mg by mouth daily.   Cholecalciferol (VITAMIN D) 2000 units tablet Take 2,000 Units by mouth daily.   clopidogrel (PLAVIX) 75 MG tablet TAKE 1 TABLET BY MOUTH EVERY DAY   dicyclomine (BENTYL) 10 MG capsule TAKE 1 CAPSULE BY MOUTH THREE TIMES A DAY BEFORE MEALS   DULoxetine (CYMBALTA) 20 MG capsule TAKE 2-3 CAPSULES BY MOUTH EVERY DAY   fluticasone (FLONASE) 50 MCG/ACT nasal spray Place 1 spray into both nostrils daily.   gabapentin (NEURONTIN) 600 MG tablet 1 tablet three times a day   levothyroxine (SYNTHROID) 75 MCG tablet TAKE 1 TABLETBY MOUTH DAILY BEFORE BREAKFAST MONDAY-FRIDAY, THEN 1/2 SATURDAY AND SUNDAY   LINZESS 72 MCG capsule TAKE 1 CAPSULE BY MOUTH DAILY BEFORE BREAKFAST.   meclizine (ANTIVERT) 25 MG tablet TAKE 1 TABLET BY MOUTH THREE TIMES A DAY AS NEEDED FOR DIZZINESS   metoprolol succinate (TOPROL-XL) 25 MG 24 hr tablet TAKE 1 TABLET BY MOUTH TWICE A DAY   nitroGLYCERIN (NITROSTAT) 0.4 MG SL tablet Place 1 tablet (0.4 mg total) under the tongue every 5 (five) minutes as needed for chest pain.   nystatin-triamcinolone (MYCOLOG II) cream Apply 1 application topically 2 (two) times daily as needed (dry skin).    pantoprazole (PROTONIX) 40 MG tablet Take 1 tablet (40 mg total) by mouth 2 (two) times daily.  potassium chloride SA (KLOR-CON M) 20 MEQ tablet Take 1 tablet (20 mEq total) by mouth as needed (for fluid or edema).   rosuvastatin (CRESTOR) 40 MG tablet Take 1 tablet (40 mg total) by mouth daily.   tiZANidine (ZANAFLEX) 4 MG tablet as needed.   vitamin B-12 1000 MCG tablet Take 1 tablet (1,000 mcg total) by mouth daily.   No facility-administered encounter medications on file as of 09/12/2022.    Allergies (verified) Amitiza [lubiprostone], Nortriptyline, Sulfa antibiotics, Tramadol,  Atorvastatin, Claritin [loratadine], and Dexamethasone   History: Past Medical History:  Diagnosis Date   Allergy    Alopecia    Anal cancer ([place]) 08/14/13   invasive squamous cell ca, s/p radiation 10/20-11/26/14 60.4Gy/86f and chemo   Anxiety    Aortoiliac occlusive disease ([place] 08/14/2017   B12 deficiency anemia 09/14/2015   Blood transfusion without reported diagnosis    BPPV (benign paroxysmal positional vertigo) 07/08/2015   Cardiomyopathy ([facility] 11/03/2017   Chronic back pain    Chronic daily headache 03/29/2013   takes bc powder   Closed right hip fracture ([facility] 09/10/2015   Coronary artery disease involving native coronary artery of native heart without angina pectoris 10/13/2017   DES to mid RCA   Depression    Essential (hemorrhagic) thrombocythemia ([facility] 09/06/2018   Family history of early CAD 04/08/2016   GERD (gastroesophageal reflux disease)    History of hiatal hernia    Hot flashes    Hyperlipidemia    Hyperlipidemia associated with type 2 diabetes mellitus ([facility] 05/11/2017   Hypothyroidism 09/10/2015   IBS (irritable bowel syndrome) 03/29/2013   Neck pain 07/08/2015   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   takes neurotin   Peripheral vascular disease ([facility] 11/03/2017   QT prolongation    Sacroiliitis ([facility] 09/06/2018   Spasms of the hands or feet 10/08/2017   Syncope 06/07/2016   Tubular adenoma of colon 09/08/2003   Vertigo    Wears glasses    Past Surgical History:  Procedure Laterality Date   ABDOMINAL AORTOGRAM N/A 08/02/2017   Procedure: ABDOMINAL AORTOGRAM;  Surgeon: AWellington Hampshire MD;  [facility];  Service: Cardiovascular;  Laterality: N/A;   AORTA - BILATERAL FEMORAL ARTERY BYPASS GRAFT N/A 08/14/2017   Procedure: AORTA BIFEMORAL BYPASS GRAFT;  Surgeon: ERosetta Posner MD;  [facility];  Service: Vascular;  Laterality: N/A;   COLONOSCOPY     CORONARY STENT INTERVENTION N/A 10/13/2017   Procedure: CORONARY STENT INTERVENTION;  Surgeon:  ENelva Bush MD;  [facility];  Service: Cardiovascular;  Laterality: N/A;   dental implant     ECTOPIC PREGNANCY SURGERY     EMBOLECTOMY N/A 08/14/2017   Procedure: EMBOLECTOMY FEMORAL;  Surgeon: ERosetta Posner MD;  [facility];  Service: Vascular;  Laterality: N/A;   FEMORAL-POPLITEAL BYPASS GRAFT Right 08/14/2017   Procedure: Right Femoral to Above Knee Popliteal Bypass Graft using Non-Reversed Greater Saphenous Vein Graft from Right Leg;  Surgeon: ERosetta Posner MD;  [place] [facility]  Service: Vascular;  Laterality: Right;   FLEXIBLE SIGMOIDOSCOPY N/A 08/14/2013   Procedure: FLEXIBLE SIGMOIDOSCOPY;  Surgeon: MLadene Artist MD;  [facility];  Service: Endoscopy;  Laterality: N/A;   LEFT HEART CATH AND CORONARY ANGIOGRAPHY N/A 10/13/2017   Procedure: LEFT HEART CATH AND CORONARY ANGIOGRAPHY;  Surgeon: ENelva Bush MD;  [facility];  Service: Cardiovascular;  Laterality: N/A;   LOWER EXTREMITY ANGIOGRAPHY Bilateral 08/02/2017   Procedure: Lower Extremity Angiography;  Surgeon: Wellington Hampshire, MD;  Location: [place] [facility];  Service: Cardiovascular;  Laterality: Bilateral;   MULTIPLE TOOTH EXTRACTIONS     PERIPHERAL VASCULAR INTERVENTION  05/22/2018   Procedure: PERIPHERAL VASCULAR INTERVENTION;  Surgeon: Serafina Mitchell, MD;  Location: [place] [facility];  Service: Cardiovascular;;  SMA and Celiac   PILONIDAL CYST EXCISION     POLYPECTOMY     THROMBECTOMY FEMORAL ARTERY Right 08/14/2017   Procedure: THROMBECTOMY FEMORAL ARTERY;  Surgeon: Rosetta Posner, MD;  Location: [facility];  Service: Vascular;  Laterality: Right;   TONSILLECTOMY     TOTAL HIP ARTHROPLASTY  09/11/2015   Procedure: TOTAL HIP ARTHROPLASTY;  Surgeon: Renette Butters, MD;  Location: [facility];  Service: Orthopedics;;   ULTRASOUND GUIDANCE FOR VASCULAR ACCESS  10/13/2017   Procedure: Ultrasound Guidance For Vascular Access;  Surgeon: Nelva Bush, MD;  Location: [place] [facility];  Service: Cardiovascular;;   VISCERAL ANGIOGRAPHY N/A 03/03/2020   Procedure: MESTENRIC ANGIOGRAPHY;  Surgeon: Serafina Mitchell, MD;  Location: [place] [facility];  Service: Cardiovascular;  Laterality: N/A;   Family History  Problem Relation Age of Onset   Arthritis Mother    Hyperlipidemia Mother    Heart disease Mother    Hypertension Mother    Stroke Mother 82   Irritable bowel syndrome Mother    Thyroid disease Mother    Heart attack Mother    Heart disease Father    Hyperlipidemia Father    Hypertension Father    Stroke Father 81   Thyroid disease Father    Prostate cancer Father    Heart attack Father    Lung cancer Brother 59   Heart attack Maternal Grandfather    Heart attack Maternal Uncle    Colon cancer Neg Hx    Rectal cancer Neg Hx    Stomach cancer Neg Hx    Esophageal cancer Neg Hx    Social History   Socioeconomic History   Marital status: Married    Spouse name: Not on file   Number of children: 0   Years of education: Not on file   Highest education level: Not on file  Occupational History   Occupation: caregiver  Tobacco Use   Smoking status: Former    Packs/day: 0.50    Types: Cigarettes    Quit date: 10/07/2014    Years since quitting: 7.9   Smokeless tobacco: Never  Vaping Use   Vaping Use: Never used  Substance and Sexual Activity   Alcohol use: No    Alcohol/week: 0.0 standard drinks of alcohol   Drug use: No   Sexual activity: Yes    Partners: Male  Other Topics Concern   Not on file  Social History Narrative   Work or School: homemaker      Home Situation: lives with her husband,[name] takes care of her elderly parents       Spiritual Beliefs: Christian      Lifestyle: no regular exercise, poor diet      Right handed             Social Determinants of Health   Financial Resource Strain: Low Risk  (09/12/2022)   Overall Financial Resource Strain (CARDIA)    Difficulty of Paying Living Expenses: Not  hard at all  Food Insecurity: No Food Insecurity (09/12/2022)   Hunger Vital Sign    Worried About Running Out of Food in the Last Year: Never true    Ran Out  of Food in the Last Year: Never true  Transportation Needs: No Transportation Needs (09/12/2022)   PRAPARE - Hydrologist (Medical): No    Lack of Transportation (Non-Medical): No  Physical Activity: Inactive (09/12/2022)   Exercise Vital Sign    Days of Exercise per Week: 0 days    Minutes of Exercise per Session: 0 min  Stress: No Stress Concern Present (09/12/2022)   [place]    Feeling of Stress : Not at all  Social Connections: [place] (09/12/2022)   Social Connection and Isolation Panel [NHANES]    Frequency of Communication with Friends and Family: More than three times a week    Frequency of Social Gatherings with Friends and Family: More than three times a week    Attends Religious Services: More than 4 times per year    Active Member of [employer] or Organizations: Yes    Attends Music therapist: More than 4 times per year    Marital Status: Married    Tobacco Counseling Counseling given: Not Answered   Clinical Intake:  Pre-visit preparation completed: No  Pain : No/denies pain     BMI - recorded: 19.49 Nutritional Status: BMI of 19-24  Normal Nutritional Risks: None Diabetes: No  How often do you need to have someone help you when you read instructions, pamphlets, or other written materials from your doctor or pharmacy?: 1 - Never  Diabetic?  No  Interpreter Needed?: No  Information entered by :: Rolene Arbour LPN   Activities of Daily Living    09/12/2022   11:30 AM  In your present state of health, do you have any difficulty performing the following activities:  Hearing? 0  Vision? 0  Difficulty concentrating or making decisions? 0  Walking or climbing stairs? 0   Dressing or bathing? 0  Doing errands, shopping? 0  Preparing Food and eating ? N  Using the Toilet? N  In the past six months, have you accidently leaked urine? Y  Comment Wears breifs. Followed by PCP  Do you have problems with loss of bowel control? N  Managing your Medications? N  Managing your Finances? N  Housekeeping or managing your Housekeeping? N    Patient Care Team: Farrel Conners, MD as PCP - General (Family Medicine) [name], MD as PCP - Cardiology (Cardiology) Cameron Sprang, MD as Consulting Physician (Neurology) Sharmon Revere as Physician Assistant (Cardiology)  Indicate any recent Medical Services you may have received from other than [facility] providers in the past year (date may be approximate).     Assessment:   This is a routine wellness examination for Erin [name].  Hearing/Vision screen Hearing Screening - Comments:: Denies hearing difficulties   Vision Screening - Comments:: Wears rx glasses - up to date with routine eye exams with  patient deferred  Dietary issues and exercise activities discussed: Current Exercise Habits: The patient does not participate in regular exercise at present, Exercise limited by: None identified   Goals Addressed               This Visit's Progress     No Current Goals (pt-stated)         Depression Screen    09/12/2022   11:29 AM 09/01/2022    2:37 PM 12/10/2021    2:14 PM 04/07/2021    1:28 PM 08/06/2019   11:49 AM 04/02/2019    3:54 PM 09/06/2018  11:07 AM  PHQ 2/9 Scores  PHQ - 2 Score 0 2 1 0 2 1 0  PHQ- 9 Score 0 '16 10 5 11 9 3    '$ Fall Risk    09/12/2022   11:32 AM 12/16/2019    9:25 AM 04/27/2018    2:45 PM 09/29/2014   11:40 AM  Fall Risk   Falls in the past year? 1 1 No No  Number falls in past yr: 0 1    Injury with Fall? 0 0    Risk for fall due to : No Fall Risks     Follow up Falls prevention discussed       [place]:  Any stairs in or  around the home? No  If so, are there any without handrails? No  Home free of loose throw rugs in walkways, pet beds, electrical cords, etc? Yes  Adequate lighting in your home to reduce risk of falls? Yes   ASSISTIVE DEVICES UTILIZED TO PREVENT FALLS:  Life alert? No  Use of a cane, walker or w/c? Yes  Grab bars in the bathroom? Yes  Shower chair or bench in shower? Yes  Elevated toilet seat or a handicapped toilet? Yes   TIMED UP AND GO:  Was the test performed? No . Audio Visit    Cognitive Function:        09/12/2022   11:33 AM  6CIT Screen  What Year? 0 points  What month? 0 points  What time? 0 points  Count back from 20 0 points  Months in reverse 0 points  Repeat phrase 0 points  Total Score 0 points    Immunizations Immunization History  Administered Date(s) Administered   Fluad Quad(high Dose 65+) 10/07/2020, 12/10/2021   Influenza Inj Mdck Quad Pf 08/26/2019   Influenza,inj,Quad PF,6+ Mos 09/12/2014, 08/03/2015, 08/15/2016, 10/26/2017, 08/16/2018   Moderna Sars-Covid-2 Vaccination 01/01/2020, 01/29/2020   PNEUMOCOCCAL CONJUGATE-20 12/10/2021   Pneumococcal Polysaccharide-23 10/27/2014      Flu Vaccine status: Up to date  Pneumococcal vaccine status: Up to date  Covid-19 vaccine status: Completed vaccines  Qualifies for Shingles Vaccine? Yes   Zostavax completed No   Shingrix Completed?: No.    Education has been provided regarding the importance of this vaccine. Patient has been advised to call insurance company to determine out of pocket expense if they have not yet received this vaccine. Advised may also receive vaccine at local pharmacy or Health Dept. Verbalized acceptance and understanding.  Screening Tests Health Maintenance  Topic Date Due   MAMMOGRAM  08/08/2020   Diabetic kidney evaluation - Urine ACR  02/11/2021   DEXA SCAN  Never done   HEMOGLOBIN A1C  06/09/2022   COVID-19 Vaccine (3 - Moderna risk series) 09/17/2022 (Originally  02/26/2020)   OPHTHALMOLOGY EXAM  12/02/2022 (Originally 01/20/2020)   Zoster Vaccines- Shingrix (1 of 2) 12/02/2022 (Originally 08/27/1975)   INFLUENZA VACCINE  02/19/2023 (Originally 06/21/2022)   TETANUS/TDAP  03/08/2025 (Originally 08/27/1975)   Diabetic kidney evaluation - GFR measurement  05/24/2023   COLONOSCOPY (Pts 45-61yr Insurance coverage will need to be confirmed)  06/23/2023   Pneumonia Vaccine 66 Years old  Completed   Hepatitis C Screening  Addressed   HPV VACCINES  Aged Out   FOOT EXAM  Discontinued    Health Maintenance  Health Maintenance Due  Topic Date Due   MAMMOGRAM  08/08/2020   Diabetic kidney evaluation - Urine ACR  02/11/2021   DEXA SCAN  Never done   HEMOGLOBIN A1C  06/09/2022    Colorectal cancer screening: Type of screening: Colonoscopy. Completed 06/22/22. Repeat every 1 years  Mammogram status: Ordered 12/10/21. Pt provided with contact info and advised to call to schedule appt.   Bone Density status: Ordered 12/10/21. Pt provided with contact info and advised to call to schedule appt.  Lung Cancer Screening: (Low Dose CT Chest recommended if Age 45-80 years, 30 pack-year currently smoking OR have quit w/in 15years.) does not qualify.     Additional Screening:  Hepatitis C Screening: does qualify; Completed 08/03/15  Vision Screening: Recommended annual ophthalmology exams for early detection of glaucoma and other disorders of the eye. Is the patient up to date with their annual eye exam?  No  Who is the provider or what is the name of the office in which the patient attends annual eye exams? Deferred If pt is not established with a provider, would they like to be referred to a provider to establish care? No .   Dental Screening: Recommended annual dental exams for proper oral hygiene  Community Resource Referral / Chronic Care Management:  CRR required this visit?  No   CCM required this visit?  No      Plan:     I have personally reviewed  and noted the following in the patient's chart:   Medical and social history Use of alcohol, tobacco or illicit drugs  Current medications and supplements including opioid prescriptions. Patient is not currently taking opioid prescriptions. Functional ability and status Nutritional status Physical activity Advanced directives List of other physicians Hospitalizations, surgeries, and ER visits in previous 12 months Vitals Screenings to include cognitive, depression, and falls Referrals and appointments  In addition, I have reviewed and discussed with patient certain preventive protocols, quality metrics, and best practice recommendations. A written personalized care plan for preventive services as well as general preventive health recommendations were provided to patient.     Criselda Peaches, LPN   [id number]/12/[id number]   Nurse Notes: Patient due Diabetic kidney evaluation-Urine ACR and Hemoglobin A1C

## 2022-09-13 ENCOUNTER — Telehealth: Payer: Self-pay | Admitting: *Deleted

## 2022-09-13 NOTE — Patient Outreach (Signed)
  Care Coordination   09/13/2022 Name: Erin [name] MRN: [id number] DOB: 06/08/1956   Care Coordination Outreach Attempts:  A second unsuccessful outreach was attempted today to offer the patient with information about available care coordination services as a benefit of their health plan.     Follow Up Plan:  Additional outreach attempts will be made to offer the patient care coordination information and services.   Encounter Outcome:  No Answer  Care Coordination Interventions Activated:  No   Care Coordination Interventions:  No, not indicated    Raina Mina, RN Care Management Coordinator [place] Office [phone]

## 2022-09-14 ENCOUNTER — Other Ambulatory Visit: Payer: Medicare Other

## 2022-09-16 ENCOUNTER — Ambulatory Visit (INDEPENDENT_AMBULATORY_CARE_PROVIDER_SITE_OTHER): Payer: Medicare Other | Admitting: Family Medicine

## 2022-09-16 ENCOUNTER — Encounter: Payer: Self-pay | Admitting: Family Medicine

## 2022-09-16 VITALS — BP 136/70 | HR 62 | Temp 98.8°F | Wt 114.0 lb

## 2022-09-16 DIAGNOSIS — K432 Incisional hernia without obstruction or gangrene: Secondary | ICD-10-CM | POA: Diagnosis not present

## 2022-09-16 NOTE — Progress Notes (Signed)
   Subjective:    Patient ID: Erin [name], female    DOB: 04-14-1956, 66 y.o.   MRN: [id number]  HPI Here to check a possible hernia in her abdomen. She had an aortobifemoral bypass surgery on 08-14-17, and this healed well. She has IBS with predominant constipation, so she often has to strain to have a BM. Five days ago she strained with a BM and she felt a popping sensation with a little pain at the site of her previous surgery. Now there is a bulge there. She has very mild pain only.    Review of Systems  Constitutional: Negative.   Respiratory: Negative.    Cardiovascular: Negative.   Gastrointestinal:  Positive for abdominal pain.       Objective:   Physical Exam Constitutional:      Appearance: Normal appearance. She is not ill-appearing.  Cardiovascular:     Rate and Rhythm: Normal rate and regular rhythm.     Pulses: Normal pulses.     Heart sounds: Normal heart sounds.  Pulmonary:     Effort: Pulmonary effort is normal.     Breath sounds: Normal breath sounds.  Abdominal:     General: Abdomen is flat. Bowel sounds are normal.     Palpations: Abdomen is soft.     Comments: There is a large easily reducible mildly tender hernia at the previous surgical scar midway between the umbilicus and the pubis   Neurological:     Mental Status: She is alert.           Assessment & Plan:  Incisional hernia. We will refer her to Surgery to evaluate. She knows to avoid any heavy lifting.  Alysia Penna, MD

## 2022-09-22 ENCOUNTER — Telehealth: Payer: Self-pay | Admitting: Family Medicine

## 2022-09-22 NOTE — Telephone Encounter (Signed)
Pt called to ask if she can amend her referral so that she can see:  Dr. Arnoldo Morale with [facility] [phone]

## 2022-09-23 NOTE — Telephone Encounter (Signed)
Ok that's fine- can you do that for me? I don't know how to amend a referral

## 2022-09-25 ENCOUNTER — Other Ambulatory Visit: Payer: Self-pay | Admitting: Family Medicine

## 2022-09-26 ENCOUNTER — Other Ambulatory Visit: Payer: Self-pay | Admitting: Gastroenterology

## 2022-09-26 DIAGNOSIS — K2970 Gastritis, unspecified, without bleeding: Secondary | ICD-10-CM

## 2022-09-26 DIAGNOSIS — R112 Nausea with vomiting, unspecified: Secondary | ICD-10-CM

## 2022-10-06 ENCOUNTER — Ambulatory Visit: Payer: Medicare Other | Admitting: General Surgery

## 2022-10-16 ENCOUNTER — Other Ambulatory Visit: Payer: Self-pay | Admitting: Family Medicine

## 2022-10-22 ENCOUNTER — Other Ambulatory Visit: Payer: Self-pay | Admitting: Family Medicine

## 2022-10-22 ENCOUNTER — Other Ambulatory Visit: Payer: Self-pay | Admitting: Gastroenterology

## 2022-10-22 DIAGNOSIS — K2970 Gastritis, unspecified, without bleeding: Secondary | ICD-10-CM

## 2022-10-22 DIAGNOSIS — R112 Nausea with vomiting, unspecified: Secondary | ICD-10-CM

## 2022-10-24 ENCOUNTER — Other Ambulatory Visit: Payer: Self-pay | Admitting: *Deleted

## 2022-10-24 DIAGNOSIS — B370 Candidal stomatitis: Secondary | ICD-10-CM | POA: Diagnosis not present

## 2022-10-24 DIAGNOSIS — J029 Acute pharyngitis, unspecified: Secondary | ICD-10-CM | POA: Diagnosis not present

## 2022-10-24 MED ORDER — ACYCLOVIR 5 % EX OINT: 1.0000 | TOPICAL_OINTMENT | CUTANEOUS | 3 refills | Status: AC | PRN

## 2022-10-25 ENCOUNTER — Ambulatory Visit: Payer: Medicare Other | Admitting: General Surgery

## 2022-11-09 ENCOUNTER — Ambulatory Visit: Payer: Medicare Other | Admitting: Physician Assistant

## 2022-11-18 ENCOUNTER — Ambulatory Visit: Payer: Medicare Other | Admitting: Cardiovascular Disease

## 2022-11-20 ENCOUNTER — Other Ambulatory Visit: Payer: Self-pay | Admitting: Family Medicine

## 2022-12-03 ENCOUNTER — Other Ambulatory Visit: Payer: Self-pay | Admitting: Family Medicine

## 2022-12-06 ENCOUNTER — Ambulatory Visit: Payer: Medicare Other | Admitting: General Surgery

## 2022-12-07 ENCOUNTER — Ambulatory Visit: Payer: Medicare Other | Admitting: Physician Assistant

## 2022-12-13 NOTE — Progress Notes (Incomplete)
[facility] Note  12/27/2022     CHIEF COMPLAINT Patient presents for No chief complaint on file.   HISTORY OF PRESENT ILLNESS: Erin [name] is a 67 y.o. female who presents to the clinic today for:     Referring physician: Farrel Conners, MD [place],  [place] [id number]  HISTORICAL INFORMATION:   Selected notes from the MEDICAL RECORD NUMBER Referred by Dr. Truman Hayward:  Ocular Hx- PMH-    CURRENT MEDICATIONS: No current outpatient medications on file. (Ophthalmic Drugs)   No current facility-administered medications for this visit. (Ophthalmic Drugs)   Current Outpatient Medications (Other)  Medication Sig   acetaminophen (TYLENOL) 500 MG tablet Take 1,000 mg by mouth daily as needed for headache (pain).    acyclovir ointment (ZOVIRAX) 5 % Apply 1 Application topically every 3 (three) hours as needed (fever blisters).   aspirin EC 81 MG tablet Take 81 mg by mouth daily.   Cholecalciferol (VITAMIN D) 2000 units tablet Take 2,000 Units by mouth daily.   clopidogrel (PLAVIX) 75 MG tablet TAKE 1 TABLET BY MOUTH EVERY DAY   dicyclomine (BENTYL) 10 MG capsule TAKE 1 CAPSULE BY MOUTH THREE TIMES A DAY BEFORE MEALS   DULoxetine (CYMBALTA) 20 MG capsule TAKE 2-3 CAPSULES BY MOUTH EVERY DAY   fluticasone (FLONASE) 50 MCG/ACT nasal spray Place 1 spray into both nostrils daily.   gabapentin (NEURONTIN) 600 MG tablet 1 tablet three times a day   KLOR-CON M20 20 MEQ tablet TAKE 1 TABLET (20 MEQ TOTAL) BY MOUTH AS NEEDED (FOR FLUID OR EDEMA).   levothyroxine (SYNTHROID) 75 MCG tablet TAKE 1 TABLETBY MOUTH DAILY BEFORE BREAKFAST MONDAY-FRIDAY, THEN 1/2 SATURDAY AND SUNDAY   LINZESS 72 MCG capsule TAKE 1 CAPSULE BY MOUTH DAILY BEFORE BREAKFAST.   meclizine (ANTIVERT) 25 MG tablet TAKE 1 TABLET BY MOUTH THREE TIMES A DAY AS NEEDED FOR DIZZINESS   metoprolol succinate (TOPROL-XL) 25 MG 24 hr tablet TAKE 1 TABLET BY MOUTH TWICE A DAY    nitroGLYCERIN (NITROSTAT) 0.4 MG SL tablet Place 1 tablet (0.4 mg total) under the tongue every 5 (five) minutes as needed for chest pain.   nystatin-triamcinolone (MYCOLOG II) cream Apply 1 application topically 2 (two) times daily as needed (dry skin).    pantoprazole (PROTONIX) 40 MG tablet Take 1 tablet (40 mg total) by mouth 2 (two) times daily.   rosuvastatin (CRESTOR) 40 MG tablet Take 1 tablet (40 mg total) by mouth daily.   tiZANidine (ZANAFLEX) 4 MG tablet as needed.   vitamin B-12 1000 MCG tablet Take 1 tablet (1,000 mcg total) by mouth daily.   No current facility-administered medications for this visit. (Other)      REVIEW OF SYSTEMS:    ALLERGIES Allergies  Allergen Reactions   Amitiza [Lubiprostone] Diarrhea    Uncontrollable diarrhea   Nortriptyline Other (See Comments)    Stomach distention   Sulfa Antibiotics Nausea And Vomiting and Other (See Comments)    GI distress/pain   Tramadol Itching and Rash   Atorvastatin Nausea And Vomiting   Claritin [Loratadine] Other (See Comments)    Hot flashes   Dexamethasone Itching    PAST MEDICAL HISTORY Past Medical History:  Diagnosis Date   Allergy    Alopecia    Anal cancer ([place]) 08/14/13   invasive squamous cell ca, s/p radiation 10/20-11/26/14 60.4Gy/17f and chemo   Anxiety    Aortoiliac occlusive disease ([place] 08/14/2017   B12 deficiency anemia  09/14/2015   Blood transfusion without reported diagnosis    BPPV (benign paroxysmal positional vertigo) 07/08/2015   Cardiomyopathy ([place]) 11/03/2017   Chronic back pain    Chronic daily headache 03/29/2013   takes bc powder   Closed right hip fracture ([place]) 09/10/2015   Coronary artery disease involving native coronary artery of native heart without angina pectoris 10/13/2017   DES to mid RCA   Depression    Essential (hemorrhagic) thrombocythemia ([place]) 09/06/2018   Family history of early CAD 04/08/2016   GERD (gastroesophageal reflux disease)    History of hiatal  hernia    Hot flashes    Hyperlipidemia    Hyperlipidemia associated with type 2 diabetes mellitus ([place]) 05/11/2017   Hypothyroidism 09/10/2015   IBS (irritable bowel syndrome) 03/29/2013   Neck pain 07/08/2015   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   takes neurotin   Peripheral vascular disease ([place]) 11/03/2017   QT prolongation    Sacroiliitis ([place]) 09/06/2018   Spasms of the hands or feet 10/08/2017   Syncope 06/07/2016   Tubular adenoma of colon 09/08/2003   Vertigo    Wears glasses    Past Surgical History:  Procedure Laterality Date   ABDOMINAL AORTOGRAM N/A 08/02/2017   Procedure: ABDOMINAL AORTOGRAM;  Surgeon: Wellington Hampshire, MD;  Location: [facility];  Service: Cardiovascular;  Laterality: N/A;   AORTA - BILATERAL FEMORAL ARTERY BYPASS GRAFT N/A 08/14/2017   Procedure: AORTA BIFEMORAL BYPASS GRAFT;  Surgeon: Rosetta Posner, MD;  Location: [facility];  Service: Vascular;  Laterality: N/A;   COLONOSCOPY     CORONARY STENT INTERVENTION N/A 10/13/2017   Procedure: CORONARY STENT INTERVENTION;  Surgeon: Nelva Bush, MD;  Location: [facility];  Service: Cardiovascular;  Laterality: N/A;   dental implant     ECTOPIC PREGNANCY SURGERY     EMBOLECTOMY N/A 08/14/2017   Procedure: EMBOLECTOMY FEMORAL;  Surgeon: Rosetta Posner, MD;  Location: [facility];  Service: Vascular;  Laterality: N/A;   FEMORAL-POPLITEAL BYPASS GRAFT Right 08/14/2017   Procedure: Right Femoral to Above Knee Popliteal Bypass Graft using Non-Reversed Greater Saphenous Vein Graft from Right Leg;  Surgeon: Rosetta Posner, MD;  Location: [facility];  Service: Vascular;  Laterality: Right;   FLEXIBLE SIGMOIDOSCOPY N/A 08/14/2013   Procedure: FLEXIBLE SIGMOIDOSCOPY;  Surgeon: Ladene Artist, MD;  Location: [facility];  Service: Endoscopy;  Laterality: N/A;   LEFT HEART CATH AND CORONARY ANGIOGRAPHY N/A 10/13/2017   Procedure: LEFT HEART CATH AND CORONARY ANGIOGRAPHY;  Surgeon: Nelva Bush, MD;  Location: [facility];   Service: Cardiovascular;  Laterality: N/A;   LOWER EXTREMITY ANGIOGRAPHY Bilateral 08/02/2017   Procedure: Lower Extremity Angiography;  Surgeon: Wellington Hampshire, MD;  Location: [facility];  Service: Cardiovascular;  Laterality: Bilateral;   MULTIPLE TOOTH EXTRACTIONS     PERIPHERAL VASCULAR INTERVENTION  05/22/2018   Procedure: PERIPHERAL VASCULAR INTERVENTION;  Surgeon: Serafina Mitchell, MD;  Location: [facility];  Service: Cardiovascular;;  SMA and Celiac   PILONIDAL CYST EXCISION     POLYPECTOMY     THROMBECTOMY FEMORAL ARTERY Right 08/14/2017   Procedure: THROMBECTOMY FEMORAL ARTERY;  Surgeon: Rosetta Posner, MD;  Location: [facility];  Service: Vascular;  Laterality: Right;   TONSILLECTOMY     TOTAL HIP ARTHROPLASTY  09/11/2015   Procedure: TOTAL HIP ARTHROPLASTY;  Surgeon: Renette Butters, MD;  Location: [facility];  Service: Orthopedics;;   ULTRASOUND GUIDANCE FOR VASCULAR ACCESS  10/13/2017   Procedure:  Ultrasound Guidance For Vascular Access;  Surgeon: Nelva Bush, MD;  Location: [facility];  Service: Cardiovascular;;   VISCERAL ANGIOGRAPHY N/A 03/03/2020   Procedure: MESTENRIC ANGIOGRAPHY;  Surgeon: Serafina Mitchell, MD;  Location: [facility];  Service: Cardiovascular;  Laterality: N/A;    FAMILY HISTORY Family History  Problem Relation Age of Onset   Arthritis Mother    Hyperlipidemia Mother    Heart disease Mother    Hypertension Mother    Stroke Mother 2   Irritable bowel syndrome Mother    Thyroid disease Mother    Heart attack Mother    Heart disease Father    Hyperlipidemia Father    Hypertension Father    Stroke Father 19   Thyroid disease Father    Prostate cancer Father    Heart attack Father    Lung cancer Brother 53   Heart attack Maternal Grandfather    Heart attack Maternal Uncle    Colon cancer Neg Hx    Rectal cancer Neg Hx    Stomach cancer Neg Hx    Esophageal cancer Neg Hx     SOCIAL HISTORY Social History    Tobacco Use   Smoking status: Former    Packs/day: 0.50    Types: Cigarettes    Quit date: 10/07/2014    Years since quitting: 8.1   Smokeless tobacco: Never  Vaping Use   Vaping Use: Never used  Substance Use Topics   Alcohol use: No    Alcohol/week: 0.0 standard drinks of alcohol   Drug use: No         OPHTHALMIC EXAM:  Not recorded     IMAGING AND PROCEDURES  Imaging and Procedures for 12/27/2022           ASSESSMENT/PLAN:  No diagnosis found.  1.  2.  3.  Ophthalmic Meds Ordered this visit:  No orders of the defined types were placed in this encounter.      No follow-ups on file.  There are no Patient Instructions on file for this visit.   Explained the diagnoses, plan, and follow up with the patient and they expressed understanding.  Patient expressed understanding of the importance of proper follow up care.    This document serves as a record of services personally performed by Gardiner Sleeper, MD, PhD. It was created on their behalf by Renaldo Reel, [place] Line an ophthalmic technician. The creation of this record is the provider's dictation and/or activities during the visit.    Electronically signed by:  Renaldo Reel, COT  01.23.24 8:30 AM  Gardiner Sleeper, M.D., Ph.D. Diseases & Surgery of the Retina and Vitreous [facility] '@TODAY'$ @     Abbreviations: M myopia (nearsighted); A astigmatism; H hyperopia (farsighted); P presbyopia; Mrx spectacle prescription;  CTL contact lenses; OD right eye; OS left eye; OU both eyes  XT exotropia; ET esotropia; PEK punctate epithelial keratitis; PEE punctate epithelial erosions; DES dry eye syndrome; MGD meibomian gland dysfunction; ATs artificial tears; PFAT's preservative free artificial tears; [place] nuclear sclerotic cataract; PSC posterior subcapsular cataract; ERM epi-retinal membrane; PVD posterior vitreous detachment; RD retinal detachment; DM diabetes mellitus; DR  diabetic retinopathy; NPDR non-proliferative diabetic retinopathy; PDR proliferative diabetic retinopathy; CSME clinically significant macular edema; DME diabetic macular edema; dbh dot blot hemorrhages; CWS cotton wool spot; POAG primary open angle glaucoma; C/D cup-to-disc ratio; HVF humphrey visual field; GVF goldmann visual field; OCT optical coherence tomography; IOP intraocular pressure; BRVO Branch retinal  vein occlusion; CRVO central retinal vein occlusion; CRAO central retinal artery occlusion; BRAO branch retinal artery occlusion; RT retinal tear; SB scleral buckle; PPV pars plana vitrectomy; VH Vitreous hemorrhage; PRP panretinal laser photocoagulation; IVK intravitreal kenalog; VMT vitreomacular traction; MH Macular hole;  NVD neovascularization of the disc; NVE neovascularization elsewhere; AREDS age related eye disease study; ARMD age related macular degeneration; POAG primary open angle glaucoma; EBMD epithelial/anterior basement membrane dystrophy; ACIOL anterior chamber intraocular lens; IOL intraocular lens; PCIOL posterior chamber intraocular lens; Phaco/IOL phacoemulsification with intraocular lens placement; [place] photorefractive keratectomy; LASIK laser assisted in situ keratomileusis; HTN hypertension; DM diabetes mellitus; COPD chronic obstructive pulmonary disease

## 2022-12-23 ENCOUNTER — Other Ambulatory Visit: Payer: Self-pay | Admitting: Family Medicine

## 2022-12-23 ENCOUNTER — Other Ambulatory Visit: Payer: Self-pay | Admitting: Vascular Surgery

## 2022-12-23 DIAGNOSIS — G63 Polyneuropathy in diseases classified elsewhere: Secondary | ICD-10-CM

## 2022-12-27 ENCOUNTER — Encounter (INDEPENDENT_AMBULATORY_CARE_PROVIDER_SITE_OTHER): Payer: Self-pay

## 2022-12-27 ENCOUNTER — Encounter (INDEPENDENT_AMBULATORY_CARE_PROVIDER_SITE_OTHER): Payer: Medicare Other | Admitting: Ophthalmology

## 2023-01-11 ENCOUNTER — Telehealth: Payer: Self-pay | Admitting: Gastroenterology

## 2023-01-11 MED ORDER — LINACLOTIDE 72 MCG PO CAPS: 72.0000 ug | ORAL_CAPSULE | Freq: Two times a day (BID) | ORAL | 10 refills | Status: DC

## 2023-01-11 NOTE — Telephone Encounter (Signed)
Although not standard it's ok with me.

## 2023-01-11 NOTE — Telephone Encounter (Signed)
The prescription has been sent to the pharmacy and pt advised

## 2023-01-11 NOTE — Telephone Encounter (Signed)
Dr Fuller Plan the pt would like her Linzess 72 mcg prescribed for BID dose.  She states she has spoken to you before about this and says it works better than taking 145 mcg.  Is it ok to refill as requested?

## 2023-01-11 NOTE — Telephone Encounter (Signed)
Inbound call from pt , she want to speak with a nurse in regards medication LINZESS  she want to know if you can increase dose.Marland KitchenPlease advise

## 2023-01-12 ENCOUNTER — Ambulatory Visit: Payer: Self-pay

## 2023-01-12 NOTE — Patient Instructions (Signed)
Visit Information  Thank you for taking time to visit with me today. Please don't hesitate to contact me if I can be of assistance to you.   Following are the goals we discussed today:   Goals Addressed             This Visit's Progress    Care Coordination Activities       Care Coordination Interventions: Outbound call placed to the patient to assess for interest in engagement with the Care Coordination team Discussed patient has been dealing with a sore throat for over 3 months; last seen in urgent care on 12/4 and prescribed an antibiotic as well as liquid medication. Patient reports it started to get better but came back. Patient reports she was last seen by primary care provider in October for vaginal odor. She is still dealing with this Encouraged the patient to contact her primary care provider for an appointment - patient reports she is unable to secure an appointment until April 16th which is the next available  Performed chart review to note patient was referred to Gynecology after October visit - patient reports she does not recall hearing from Gyn to schedule an appointment Advised the patient SW will communicate concerns with Dr. Legrand Como and follow up with her early next week Collaboration with Dr. Legrand Como via in-basket message advising of above patient concerns         Our next appointment is by telephone on 2/26 at 12:30  Please call the care guide team at [phone] if you need to cancel or reschedule your appointment.   If you are experiencing a Mental Health or [place] or need someone to talk to, please call [phone]  Patient verbalizes understanding of instructions and care plan provided today and agrees to view in [place]. Active MyChart status and patient understanding of how to access instructions and care plan via MyChart confirmed with patient.     Telephone follow up appointment with care management team member scheduled for:2/26  Daneen Schick, Arita Miss, CDP Social Worker, Certified Dementia Practitioner [facility] Care Management  Care Coordination [phone]

## 2023-01-12 NOTE — Patient Outreach (Signed)
  Care Coordination   Initial Visit Note   01/12/2023 Name: Erin [name] MRN: [id number] DOB: Jan 08, 1956  Erin [name] is a 67 y.o. year old female who sees Farrel Conners, MD for primary care. I spoke with  [name] by phone today.  What matters to the patients health and wellness today?  I think I have thrush    Goals Addressed             This Visit's Progress    Care Coordination Activities       Care Coordination Interventions: Outbound call placed to the patient to assess for interest in engagement with the Care Coordination team Discussed patient has been dealing with a sore throat for over 3 months; last seen in urgent care on 12/4 and prescribed an antibiotic as well as liquid medication. Patient reports it started to get better but came back. Patient reports she was last seen by primary care provider in October for vaginal odor. She is still dealing with this Encouraged the patient to contact her primary care provider for an appointment - patient reports she is unable to secure an appointment until April 16th which is the next available  Performed chart review to note patient was referred to Gynecology after October visit - patient reports she does not recall hearing from Gyn to schedule an appointment Advised the patient SW will communicate concerns with Dr. Legrand Como and follow up with her early next week Collaboration with Dr. Legrand Como via in-basket message advising of above patient concerns         SDOH assessments and interventions completed:  No     Care Coordination Interventions:  Yes, provided   Interventions Today    Flowsheet Row Most Recent Value  Chronic Disease   Chronic disease during today's visit Other  General Interventions   General Interventions Discussed/Reviewed General Interventions Discussed, Communication with  Communication with PCP/Specialists        Follow up plan: Follow up call scheduled for 2/26     Encounter Outcome:  Pt. Visit Completed   Daneen Schick, Arita Miss, CDP Social Worker, Certified Dementia Practitioner [facility]  Care Coordination [phone]

## 2023-01-16 ENCOUNTER — Telehealth: Payer: Self-pay

## 2023-01-16 ENCOUNTER — Ambulatory Visit: Payer: Medicare Other | Admitting: Physician Assistant

## 2023-01-16 ENCOUNTER — Ambulatory Visit: Payer: Self-pay

## 2023-01-16 NOTE — Patient Outreach (Signed)
  Care Coordination   Follow Up Visit Note   01/16/2023 Name: Erin [name] MRN: [id number] DOB: 02-20-1956  Erin [name] is a 67 y.o. year old female who sees Erin Conners, MD for primary care. I spoke with  Erin [name] by phone today.  What matters to the patients health and wellness today?  Referral Status    Goals Addressed             This Visit's Progress    Care Coordination Activities       Care Coordination Interventions: Discussed SW has yet to get a response from patients primary care provider regarding referral to gynecology Determined patient is doing okay at this time and will await instructions from her provider          SDOH assessments and interventions completed:  No     Care Coordination Interventions:  Yes, provided   Interventions Today    Flowsheet Row Most Recent Value  Chronic Disease   Chronic disease during today's visit Other  General Interventions   General Interventions Discussed/Reviewed General Interventions Reviewed        Follow up plan:  SW will follow up with provider over the next week.    Encounter Outcome:  Pt. Visit Completed   Daneen Schick, BSW, CDP Social Worker, Certified Dementia Practitioner [facility]  Care Coordination [phone]

## 2023-01-16 NOTE — Patient Outreach (Signed)
  Care Coordination   01/16/2023 Name: Erin [name] MRN: [id number] DOB: Feb 25, 1956   Care Coordination Outreach Attempts:  An unsuccessful telephone outreach was attempted for a scheduled appointment today.  Follow Up Plan:  Additional outreach attempts will be made to offer the patient care coordination information and services.   Encounter Outcome:  No Answer   Care Coordination Interventions:  No, not indicated    Daneen Schick, BSW, CDP Social Worker, Certified Dementia Practitioner [facility]  Care Coordination [phone]

## 2023-01-17 ENCOUNTER — Encounter (INDEPENDENT_AMBULATORY_CARE_PROVIDER_SITE_OTHER): Payer: Medicare Other | Admitting: Ophthalmology

## 2023-01-19 ENCOUNTER — Ambulatory Visit: Payer: Self-pay

## 2023-01-19 ENCOUNTER — Telehealth: Payer: Self-pay | Admitting: *Deleted

## 2023-01-19 NOTE — Telephone Encounter (Signed)
Spoke with the patient and offered an appt tomorrow for evaluation of the sore throat.  I initially scheduled a virtual visit  with Dr [place] since the patient declined an in-office appt due to lack of updated drivers license.  Patient changed her mind and stated she is due to see the dentist next week and will call for an appt later if she gets her license.  I advised the patient she should have a strep test prior to seeing the dentist if possible since strep is contagious.  Patient declined scheduling a visit due to lack of transportation at this time, stated she will call back and is aware to arrive early for a strep and covid test prior to coming in the office.

## 2023-01-19 NOTE — Telephone Encounter (Signed)
-----   Message from Farrel Conners, MD sent at 01/19/2023  1:04 PM EST ----- Can we use one of the 15 minute slots for her to get her seen earlier? ----- Message ----- From: Daneen Schick Sent: 01/12/2023   2:46 PM EST To: Farrel Conners, MD  Dr. Legrand Como,  I spoke with this patient today to assess for her interest in working with the [facility] team. During the call she told me she thinks she has thrush in her mouth. Patient stated she has been dealing with vaginal odor for several months (seen by you in Oct). Since that time, her throat became sore with white patches. She went to an urgent care clinic in [place] [facility]) on 12/4 and was given an antibiotic and a liquid medication. Patient reported it started to get better but is back again.   I encouraged her to call the clinic to schedule an office visit. She mentioned your next available was 4/16 but it is a same day so she plans to call 4/15 to try and schedule. I reviewed her chart and noted you referred her to GYN in October. Patient indicates she does not recall hearing from GYN and I do not see a visit in her chart. I plan to follow up with her next week. Please let me know if there is anything you would like me to instruct the patient on.  Thank you, Daneen Schick, BSW, CDP Social Worker, Certified Dementia Practitioner [facility]  Care Coordination [phone]

## 2023-01-19 NOTE — Patient Instructions (Signed)
Visit Information  Thank you for taking time to visit with me today. Please don't hesitate to contact me if I can be of assistance to you.   Following are the goals we discussed today:   Goals Addressed             This Visit's Progress    Care Coordination Activities       Care Coordination Interventions: [facility] for [place] at [place] to follow up on the status of patients gynecology referral Determined the previous referral placed in October did not go to the appropriate work queue; Biochemist, clinical at [employer] for [employer] to contact patient to schedule an appointment. BSW was advised first available appointment will be in August Advised the patient to expect a call from the gynecology office to schedule appointment Provided the patient with the contact  number [phone]) advising the patient she should call them on Monday if a call is not received this week  Discussed patients primary care providers office contacted the patient to schedule an office visit to assess sore throat - patient declined visit at this time as she needs to renew her drivers license Assessed for ability for patient to ask a family member or friend for assistance; patient declined wanting to ask for help stating she will renew her license and call her provider directly to schedule an office visit          If you are experiencing a Mental Health or [place] or need someone to talk to, please call 911  Patient verbalizes understanding of instructions and care plan provided today and agrees to view in [place]. Active MyChart status and patient understanding of how to access instructions and care plan via MyChart confirmed with patient.     Daneen Schick, BSW, CDP Social Worker, Certified Dementia Practitioner [place] [facility]  Care Coordination [phone]

## 2023-01-19 NOTE — Patient Outreach (Signed)
  Care Coordination   Follow Up Visit Note   01/19/2023 Name: Erin [name] MRN: [id number] DOB: [birth year]  LORENNA [name] is a 67 y.o. year old female who sees Farrel Conners, MD for primary care. I spoke with  [name] by phone today.  What matters to the patients health and wellness today?  Schedule appointment with gynecology    Goals Addressed             This Visit's Progress    Care Coordination Activities       Care Coordination Interventions: [place] for [place] at [place] to follow up on the status of patients gynecology referral Determined the previous referral placed in October did not go to the appropriate work queue; Biochemist, clinical at [employer] for [employer] to contact patient to schedule an appointment. BSW was advised first available appointment will be in August Advised the patient to expect a call from the gynecology office to schedule appointment Provided the patient with the contact  number [phone]) advising the patient she should call them on Monday if a call is not received this week  Discussed patients primary care providers office contacted the patient to schedule an office visit to assess sore throat - patient declined visit at this time as she needs to renew her drivers license Assessed for ability for patient to ask a family member or friend for assistance; patient declined wanting to ask for help stating she will renew her license and call her provider directly to schedule an office visit          SDOH assessments and interventions completed:  No     Care Coordination Interventions:  Yes, provided   Interventions Today    Flowsheet Row Most Recent Value  Chronic Disease   Chronic disease during today's visit Other  General Interventions   General Interventions Discussed/Reviewed General Interventions Reviewed, Communication with  Communication with PCP/Specialists        Follow up plan:  SW  will continue to follow to assist with care coordination needs    Encounter Outcome:  Pt. Visit Completed   Daneen Schick, Arita Miss, CDP Social Worker, Certified Dementia Practitioner [facility] Care Management  Care Coordination [phone]

## 2023-01-20 ENCOUNTER — Telehealth: Payer: Medicare Other | Admitting: Family Medicine

## 2023-01-22 ENCOUNTER — Other Ambulatory Visit: Payer: Self-pay | Admitting: Family Medicine

## 2023-01-25 ENCOUNTER — Ambulatory Visit: Payer: Self-pay

## 2023-01-25 NOTE — Patient Instructions (Signed)
Visit Information  Thank you for taking time to visit with me today. Please don't hesitate to contact me if I can be of assistance to you.   Following are the goals we discussed today:   Goals Addressed             This Visit's Progress    Care Coordination Activities       Care Coordination Interventions: Determined the patient has yet to receive an appointment with the gynecologist Discussed patient is not feeling well today but welcomes SW contacting her next week to assist with scheduling           If you are experiencing a Mental Health or [place] or need someone to talk to, please call 911  Patient verbalizes understanding of instructions and care plan provided today and agrees to view in [place]. Active MyChart status and patient understanding of how to access instructions and care plan via MyChart confirmed with patient.     Daneen Schick, BSW, CDP Social Worker, Certified Dementia Practitioner [facility]  Care Coordination [phone]

## 2023-01-25 NOTE — Patient Outreach (Signed)
  Care Coordination   Follow Up Visit Note   01/25/2023 Name: Erin [name] MRN: [id number] DOB: Feb 05, 1956  Erin [name] is a 67 y.o. year old female who sees Erin Conners, MD for primary care. I spoke with  Erin [name] by phone today.  What matters to the patients health and wellness today?  To feel better    Goals Addressed             This Visit's Progress    Care Coordination Activities       Care Coordination Interventions: Determined the patient has yet to receive an appointment with the gynecologist Discussed patient is not feeling well today but welcomes SW contacting her next week to assist with scheduling           SDOH assessments and interventions completed:  No     Care Coordination Interventions:  Yes, provided   Interventions Today    Flowsheet Row Most Recent Value  Chronic Disease   Chronic disease during today's visit Other  General Interventions   General Interventions Discussed/Reviewed General Interventions Reviewed, Doctor Visits  Doctor Visits Discussed/Reviewed Doctor Visits Discussed, Specialist       Follow up plan:  SW will follow up with patient over the next week.    Encounter Outcome:  Pt. Visit Completed   Erin [name], BSW, CDP Social Worker, Certified Dementia Practitioner [facility]  Care Coordination [phone]

## 2023-01-27 NOTE — Telephone Encounter (Signed)
Patient called requesting to speak with a nurse states she has an issue with her Linzess dosage.

## 2023-01-30 ENCOUNTER — Ambulatory Visit: Payer: Self-pay

## 2023-01-30 ENCOUNTER — Telehealth: Payer: Self-pay

## 2023-01-30 NOTE — Patient Outreach (Signed)
  Care Coordination   01/30/2023 Name: Erin [name] MRN: [id number] DOB: [birth year]   Care Coordination Outreach Attempts:  An unsuccessful telephone outreach was attempted today to offer the patient information about available care coordination services as a benefit of their health plan. SW attempted to contact the patient to assist with scheduling appointment with gynecology whom patient was referred to several months ago. Voice message left requesting a return call.  Follow Up Plan:  Additional outreach attempts will be made to offer the patient care coordination information and services.   Encounter Outcome:  No Answer   Care Coordination Interventions:  No, not indicated    Daneen Schick, BSW, CDP Social Worker, Certified Dementia Practitioner [facility]  Care Coordination [phone]

## 2023-01-30 NOTE — Telephone Encounter (Signed)
Left message on machine to call back  

## 2023-01-30 NOTE — Patient Outreach (Signed)
  Care Coordination   Follow Up Visit Note   01/30/2023 Name: HILDA RYNDERS MRN: [id number] DOB: 01/26/1956  VALLIE FAYETTE is a 67 y.o. year old female who sees Farrel Conners, MD for primary care. I spoke with  [name] by phone today.  What matters to the patients health and wellness today?  Contacted the patient to follow up on scheduling appointments    Goals Addressed             This Visit's Progress    COMPLETED: Care Coordination Activities       Care Coordination Interventions: Determined the patient has yet to follow up on scheduling a gynecology appointment Attempted to assist the patient with scheduling - patient declined stating she will call soon Provided the patient with the contact number to schedule her own appointment Encouraged the patient to contact SW as needed        SDOH assessments and interventions completed:  No     Care Coordination Interventions:  Yes, provided   Interventions Today    Flowsheet Row Most Recent Value  Chronic Disease   Chronic disease during today's visit Other  General Interventions   General Interventions Discussed/Reviewed General Interventions Reviewed, Doctor Visits  Doctor Visits Discussed/Reviewed Doctor Visits Discussed        Follow up plan: No further intervention required.   Encounter Outcome:  Pt. Visit Completed   Daneen Schick, BSW, CDP Social Worker, Certified Dementia Practitioner [facility]  Care Coordination [phone]

## 2023-01-30 NOTE — Patient Instructions (Signed)
Visit Information  Thank you for taking time to visit with me today. Please don't hesitate to contact me if I can be of assistance to you.   Following are the goals we discussed today:   Goals Addressed             This Visit's Progress    COMPLETED: Care Coordination Activities       Care Coordination Interventions: Determined the patient has yet to follow up on scheduling a gynecology appointment Attempted to assist the patient with scheduling - patient declined stating she will call soon Provided the patient with the contact number to schedule her own appointment Encouraged the patient to contact SW as needed       If you are experiencing a Mental Health or [place] or need someone to talk to, please call 911  Patient verbalizes understanding of instructions and care plan provided today and agrees to view in [place]. Active MyChart status and patient understanding of how to access instructions and care plan via MyChart confirmed with patient.     No further follow up required: Please contact your primary care provider as needed.  Daneen Schick, BSW, CDP Social Worker, Certified Dementia Practitioner [facility]  Care Coordination [phone]

## 2023-01-30 NOTE — Telephone Encounter (Signed)
The pt states that she is unable to get the linzess 72 mcg for twice daily.  She says that insurance will only cover once daily. She is asking for a PA to be completed.  She will have the insurance send a PA to the PA team.

## 2023-02-02 ENCOUNTER — Encounter (INDEPENDENT_AMBULATORY_CARE_PROVIDER_SITE_OTHER): Payer: Medicare Other | Admitting: Ophthalmology

## 2023-02-02 DIAGNOSIS — H3581 Retinal edema: Secondary | ICD-10-CM

## 2023-02-07 ENCOUNTER — Telehealth: Payer: Self-pay

## 2023-02-07 ENCOUNTER — Other Ambulatory Visit (HOSPITAL_COMMUNITY): Payer: Self-pay

## 2023-02-07 NOTE — Telephone Encounter (Signed)
PA request received via provider for Linzess 72MCG capsules  PA has been submitted to [employer] Medicare Part D via CMM and is pending determination.  Key: [id number]

## 2023-02-08 ENCOUNTER — Other Ambulatory Visit (HOSPITAL_COMMUNITY): Payer: Self-pay

## 2023-02-08 MED ORDER — TRULANCE 3 MG PO TABS: 1.0000 | ORAL_TABLET | Freq: Every day | ORAL | 11 refills | Status: DC

## 2023-02-08 NOTE — Telephone Encounter (Signed)
Notified patient that the prescription was sent to patient's pharmacy. Patient verbalized understanding.

## 2023-02-08 NOTE — Telephone Encounter (Signed)
Received fax from [facility] that Linzess 72 mcg (use as directed: 2 capsules per day), is denied because the information provided was not sufficient to support approval for medical necessity. The plan limit is 1 capsule per day. Spoke with patient and informed her of denial. Patient asked if she can try another medication similar to [place]. Please advise Dr. Fuller Plan what other medication patient can try in place of Linzess.

## 2023-02-08 NOTE — Telephone Encounter (Signed)
Addt information form received from pt insurance, however medications has been changed to Trulance, and has been filled at the pharmacy on insurance.

## 2023-02-08 NOTE — Telephone Encounter (Signed)
Linzess 145 mcg qd

## 2023-02-08 NOTE — Telephone Encounter (Signed)
Patient has tried Linzess 145 mcg daily without success. Patient requests an alternative to [place] please.

## 2023-02-08 NOTE — Telephone Encounter (Signed)
Trulance 3 mg po qd, 1 year of refills

## 2023-02-18 ENCOUNTER — Other Ambulatory Visit: Payer: Self-pay | Admitting: Family Medicine

## 2023-02-24 ENCOUNTER — Other Ambulatory Visit: Payer: Self-pay | Admitting: Gastroenterology

## 2023-02-24 ENCOUNTER — Other Ambulatory Visit: Payer: Self-pay | Admitting: Family Medicine

## 2023-02-24 DIAGNOSIS — J309 Allergic rhinitis, unspecified: Secondary | ICD-10-CM

## 2023-02-24 DIAGNOSIS — K2970 Gastritis, unspecified, without bleeding: Secondary | ICD-10-CM

## 2023-02-24 DIAGNOSIS — R112 Nausea with vomiting, unspecified: Secondary | ICD-10-CM

## 2023-03-01 ENCOUNTER — Encounter (INDEPENDENT_AMBULATORY_CARE_PROVIDER_SITE_OTHER): Payer: Self-pay

## 2023-03-01 ENCOUNTER — Encounter (INDEPENDENT_AMBULATORY_CARE_PROVIDER_SITE_OTHER): Payer: Medicare Other | Admitting: Ophthalmology

## 2023-03-01 DIAGNOSIS — H3581 Retinal edema: Secondary | ICD-10-CM

## 2023-03-06 ENCOUNTER — Ambulatory Visit: Payer: Medicare Other | Admitting: Family Medicine

## 2023-03-07 ENCOUNTER — Ambulatory Visit: Payer: Medicare Other | Admitting: Physician Assistant

## 2023-03-07 ENCOUNTER — Ambulatory Visit: Payer: Medicare Other | Admitting: Family Medicine

## 2023-03-22 ENCOUNTER — Other Ambulatory Visit: Payer: Self-pay | Admitting: Family Medicine

## 2023-03-23 ENCOUNTER — Other Ambulatory Visit: Payer: Self-pay | Admitting: Cardiovascular Disease

## 2023-03-23 ENCOUNTER — Ambulatory Visit: Payer: Medicare Other | Admitting: Family Medicine

## 2023-03-28 ENCOUNTER — Other Ambulatory Visit: Payer: Self-pay | Admitting: Gastroenterology

## 2023-03-28 ENCOUNTER — Other Ambulatory Visit: Payer: Self-pay | Admitting: Family Medicine

## 2023-03-28 DIAGNOSIS — R112 Nausea with vomiting, unspecified: Secondary | ICD-10-CM

## 2023-03-28 DIAGNOSIS — K2970 Gastritis, unspecified, without bleeding: Secondary | ICD-10-CM

## 2023-04-14 ENCOUNTER — Telehealth (INDEPENDENT_AMBULATORY_CARE_PROVIDER_SITE_OTHER): Payer: Medicare Other | Admitting: Family Medicine

## 2023-04-14 ENCOUNTER — Other Ambulatory Visit: Payer: Self-pay | Admitting: Family Medicine

## 2023-04-14 DIAGNOSIS — G2581 Restless legs syndrome: Secondary | ICD-10-CM | POA: Diagnosis not present

## 2023-04-14 DIAGNOSIS — N760 Acute vaginitis: Secondary | ICD-10-CM | POA: Diagnosis not present

## 2023-04-14 MED ORDER — FLUCONAZOLE 150 MG PO TABS: 150.0000 mg | ORAL_TABLET | ORAL | 0 refills | Status: DC

## 2023-04-14 MED ORDER — ROPINIROLE HCL 0.5 MG PO TABS: 0.5000 mg | ORAL_TABLET | Freq: Every day | ORAL | 1 refills | Status: DC

## 2023-04-14 NOTE — Progress Notes (Unsigned)
Virtual Medical Office Visit  Patient:  Erin [name]      Age: 67 y.o.       Sex:  female  Date:   04/20/2023  PCP:    Karie Georges, MD   Today's Healthcare Provider: Karie Georges, MD    Assessment/Plan:   Summary assessment:  Restless leg syndrome Assessment & Plan: This could be a result of the neuropathy in her legs, I advised we try starting ropinirole, I will have her come in for labs to check electrolytes and iron panel.  RTC in 5 months for follow up.   Orders: -     rOPINIRole HCl; Take 1 tablet (0.5 mg total) by mouth at bedtime. May Increase to 2 tablet at bedtime after 7 days if needed.  Dispense: 90 tablet; Refill: 1 -     Magnesium; Future -     CK; Future -     Basic metabolic panel; Future -     Iron and TIBC; Future  Acute vaginitis -   Pt is reporting a new symptoms of intense itching and irritation of the vaginal tissue. States her discharge is a bit thicker and drier than normal. Will treat with diflucan 150 mg x2 doses, see below.     Fluconazole; Take 1 tablet (150 mg total) by mouth every 3 (three) days.  Dispense: 2 tablet; Refill: 0     Return in about 5 months (around 09/14/2023).   She was advised to call the office or go to ER if her condition worsens    Subjective:   Erin [name] is a 67 y.o. female with PMH significant for: Past Medical History:  Diagnosis Date   Allergy    Alopecia    Anal cancer (HCC) 08/14/13   invasive squamous cell ca, s/p radiation 10/20-11/26/14 60.4Gy/66fx and chemo   Anxiety    Aortoiliac occlusive disease (HCC) 08/14/2017   B12 deficiency anemia 09/14/2015   Blood transfusion without reported diagnosis    BPPV (benign paroxysmal positional vertigo) 07/08/2015   Cardiomyopathy (HCC) 11/03/2017   Chronic back pain    Chronic daily headache 03/29/2013   takes bc powder   Closed right hip fracture (HCC) 09/10/2015   Coronary artery disease involving native coronary artery of native heart  without angina pectoris 10/13/2017   DES to mid RCA   Depression    Essential (hemorrhagic) thrombocythemia (HCC) 09/06/2018   Family history of early CAD 04/08/2016   GERD (gastroesophageal reflux disease)    History of hiatal hernia    Hot flashes    Hyperlipidemia    Hyperlipidemia associated with type 2 diabetes mellitus (HCC) 05/11/2017   Hypothyroidism 09/10/2015   IBS (irritable bowel syndrome) 03/29/2013   Neck pain 07/08/2015   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   takes neurotin   Peripheral vascular disease (HCC) 11/03/2017   QT prolongation    Sacroiliitis (HCC) 09/06/2018   Spasms of the hands or feet 10/08/2017   Syncope 06/07/2016   Tubular adenoma of colon 09/08/2003   Vertigo    Wears glasses      Presenting today with: No chief complaint on file.    She clarifies and reports that her condition: Patient reports that she is doing well, her BP 125/62, weight 113.8, HR 82. Patient states her neuropathy symptoms are controlled, is satisfied with the current dose of the gabapentin.  Pt reports she is still having the leg jerking at night, just happens when she is about to go to  sleep. States that she can feel it coming on, then she gets involuntary jerking. States it is happening when she is just about to fall asleep. States that the only thing that helps is if she gets up and uses her eliptical machine. Patient is reporting increase in vaginal itching, has been going on for the last three days, no changes in discharge, states it feels swollen on one side, hurts to wipe. Has an odor to it.   She denies having any: Disorientation, falling, balance issues          Objective/Observations  Physical Exam:  Polite and friendly Gen: NAD, resting comfortably Pulm: Normal work of breathing Neuro: Grossly normal, moves all extremities Psych: Normal affect and thought content Problem specific physical exam findings:    No images are attached to the encounter or orders placed in the  encounter.    Results: No results found for any visits on 04/14/23.   No results found for this or any previous visit (from the past 2160 hour(s)).         Virtual Visit via Video   I connected with Erin [name] on 04/20/23 at  3:30 PM EDT by a video enabled telemedicine application and verified that I am speaking with the correct person using two identifiers. The limitations of evaluation and management by telemedicine and the availability of in person appointments were discussed. The patient expressed understanding and agreed to proceed.   Percentage of appointment time on video:  100% Patient location: Home Provider location: [facility] Persons participating in the virtual visit: Myself and Patient

## 2023-04-20 DIAGNOSIS — G2581 Restless legs syndrome: Secondary | ICD-10-CM | POA: Insufficient documentation

## 2023-04-20 NOTE — Assessment & Plan Note (Addendum)
This could be a result of the neuropathy in her legs, I advised we try starting ropinirole, I will have her come in for labs to check electrolytes and iron panel.  RTC in 3 months for follow up.

## 2023-04-26 ENCOUNTER — Ambulatory Visit: Payer: Medicare Other | Admitting: Physician Assistant

## 2023-04-28 ENCOUNTER — Other Ambulatory Visit: Payer: Self-pay | Admitting: Family Medicine

## 2023-05-10 ENCOUNTER — Other Ambulatory Visit (HOSPITAL_COMMUNITY)
Admission: RE | Admit: 2023-05-10 | Discharge: 2023-05-10 | Disposition: A | Discharge status: Home / Self Care | Payer: Medicare Other | Source: Ambulatory Visit | Attending: Family Medicine | Admitting: Family Medicine

## 2023-05-10 DIAGNOSIS — G2581 Restless legs syndrome: Secondary | ICD-10-CM | POA: Diagnosis not present

## 2023-05-10 DIAGNOSIS — R7989 Other specified abnormal findings of blood chemistry: Secondary | ICD-10-CM | POA: Insufficient documentation

## 2023-05-10 LAB — BASIC METABOLIC PANEL
Anion gap: 9 (ref 5–15)
BUN: 12 mg/dL (ref 8–23)
CO2: 23 mmol/L (ref 22–32)
Calcium: 9 mg/dL (ref 8.9–10.3)
Chloride: 101 mmol/L (ref 98–111)
Creatinine, Ser: 0.8 mg/dL (ref 0.44–1.00)
GFR, Estimated: >60 mL/min (ref >60)
Glucose, Bld: 97 mg/dL (ref 70–99)
Potassium: 4.6 mmol/L (ref 3.5–5.1)
Sodium: 133 mmol/L — ABNORMAL LOW (ref 135–145)

## 2023-05-10 LAB — CK: Total CK: 86 U/L (ref 38–234)

## 2023-05-10 LAB — MAGNESIUM: Magnesium: 1.8 mg/dL (ref 1.7–2.4)

## 2023-05-10 LAB — IRON AND TIBC
Iron: 56 ug/dL (ref 28–170)
Saturation Ratios: 12 % (ref 10.4–31.8)
TIBC: 483 ug/dL — ABNORMAL HIGH (ref 250–450)
UIBC: 427 ug/dL

## 2023-05-24 ENCOUNTER — Other Ambulatory Visit: Payer: Self-pay | Admitting: Cardiovascular Disease

## 2023-05-24 ENCOUNTER — Other Ambulatory Visit: Payer: Self-pay | Admitting: Family Medicine

## 2023-05-24 ENCOUNTER — Other Ambulatory Visit: Payer: Self-pay | Admitting: Gastroenterology

## 2023-06-23 ENCOUNTER — Other Ambulatory Visit: Payer: Self-pay | Admitting: Gastroenterology

## 2023-06-23 ENCOUNTER — Other Ambulatory Visit: Payer: Self-pay | Admitting: Family Medicine

## 2023-06-23 DIAGNOSIS — K2970 Gastritis, unspecified, without bleeding: Secondary | ICD-10-CM

## 2023-06-23 DIAGNOSIS — G63 Polyneuropathy in diseases classified elsewhere: Secondary | ICD-10-CM

## 2023-06-23 DIAGNOSIS — R112 Nausea with vomiting, unspecified: Secondary | ICD-10-CM

## 2023-07-03 ENCOUNTER — Ambulatory Visit: Payer: Medicare Other | Attending: Cardiovascular Disease | Admitting: Physician Assistant

## 2023-07-03 ENCOUNTER — Encounter: Payer: Self-pay | Admitting: Physician Assistant

## 2023-07-03 NOTE — Progress Notes (Deleted)
Cardiology Office Note:    Date:  07/03/2023  ID:  Erin [name], DOB Feb 08, 1956, MRN [id number] PCP: Karie Georges, MD  [facility] Providers Cardiologist:  Tonny Bollman, MD Cardiology APP:  Beatrice Lecher, PA-C { Click to update primary MD,subspecialty MD or APP then REFRESH:1}    {Click to Open Review  :1}   Patient Profile:      Coronary artery disease S/p 2 x 12 mm DES to mid RCA 09/2017 LHC 10/13/2017: Mid LAD 40, LCx normal, proximal RCA 30, mid RCA 99, distal RCA 50, EF 40-45 Previously managed with ASA + rivaroxaban 2.5 twice daily (COMPASS trial) Changed back to ASA+Clopidogrel in 2021 HFimpEF (heart failure with improved ejection fraction)  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 Myra Gianotti) S/p aortobifem and bilateral femoral to popliteal bypasses 07/2017 Chronic mesenteric ischemia S/p balloon angioplasty to SMA and celiac artery in 02/2020 (Dr. Myra Gianotti) Hypokalemia Anal CA s/p XRT 2014 Diabetes mellitus Hyperlipidemia       {Follow-up of CAD CHF PAD Last OV 05/23/2022    Triglycerides 86 and LDL in October 2023 66 ALT 12  Started on fenofibrate in August 2023 by Pharm.D. clinic:1}     Discussed the use of AI scribe software for clinical note transcription with the patient, who gave verbal consent to proceed. History of Present Illness         ROS: ***    Studies Reviewed:       *** Risk Assessment/Calculations:   {Does this patient have ATRIAL FIBRILLATION?[phone]} No BP recorded.  {Refresh Note OR Click here to enter BP  :1}***       Physical Exam:   VS:  There were no vitals taken for this visit.   Wt Readings from Last 3 Encounters:  09/16/22 114 lb (51.7 kg)  09/12/22 110 lb (49.9 kg)  09/01/22 110 lb 14.4 oz (50.3 kg)    Physical Exam***     Assessment and Plan:  No  problem-specific Assessment & Plan notes found for this encounter. Assessment and Plan          {Preoperative cardiovascular examination Ms. Huisman's perioperative risk of a major cardiac event is 6.6% according to the Revised Cardiac Risk Index (RCRI).  Therefore, she is at high risk for perioperative complications.   Her functional capacity is fair at 4.06 METs according to the Duke Activity Status Index (DASI). Recommendations: According to ACC/AHA guidelines, no further cardiovascular testing needed.  The patient may proceed to surgery at acceptable risk.   Antiplatelet and/or Anticoagulation Recommendations: The patient should remain on Aspirin without interruption.   Clopidogrel (Plavix) can be held for 5 days prior to her surgery and resumed as soon as possible post op.    Coronary artery disease involving native coronary artery of native heart without angina pectoris History of DES to the mid RCA in 2018.  She is doing well without anginal symptoms.  Continue aspirin 81 mg daily, Plavix 75 mg daily, Toprol-XL 25 mg daily, Crestor 40 mg daily.  Follow-up in 6 months.   Peripheral vascular disease (HCC) History of aortobifemoral and bilateral femoral to popliteal bypass in 2018 and balloon angioplasty to the SMA and celiac artery in 2021.  She is followed by vascular surgery.   HFimpEF (heart failure with improved EF) History of stress-induced cardiomyopathy with return of normal LV function.      :  1}    {Are you ordering a CV Procedure (e.g. stress test, cath, DCCV, TEE, etc)?   Press F2        :[id number]}  Dispo:  No follow-ups on file.  Signed, Tereso Newcomer, PA-C

## 2023-07-05 ENCOUNTER — Ambulatory Visit: Payer: Medicare Other | Admitting: Physician Assistant

## 2023-07-28 ENCOUNTER — Other Ambulatory Visit: Payer: Self-pay | Admitting: Gastroenterology

## 2023-07-28 DIAGNOSIS — K2970 Gastritis, unspecified, without bleeding: Secondary | ICD-10-CM

## 2023-07-28 DIAGNOSIS — R112 Nausea with vomiting, unspecified: Secondary | ICD-10-CM

## 2023-08-08 ENCOUNTER — Telehealth: Payer: Self-pay

## 2023-08-08 ENCOUNTER — Telehealth: Payer: Self-pay | Admitting: Family Medicine

## 2023-08-08 NOTE — Telephone Encounter (Signed)
Needs clearance for Dentist to pull teeth, [facility] [phone], fax [phone]. Patient is at the office now

## 2023-08-08 NOTE — Telephone Encounter (Signed)
1st attmept: I left a message for the patient to call our office to schedule an in office visit for pre-op clearance.

## 2023-08-08 NOTE — Telephone Encounter (Signed)
Name: Erin [name]  DOB: 09-Dec-1955  MRN: [id number]  Primary Cardiologist: Tonny Bollman, MD  Chart reviewed as part of pre-operative protocol coverage. Because of [name] past medical history and time since last visit, she will require a follow-up in-office visit in order to better assess preoperative cardiovascular risk.  Pre-op covering staff: - Please schedule appointment and call patient to inform them. If patient already had an upcoming appointment within acceptable timeframe, please add "pre-op clearance" to the appointment notes so provider is aware. - Please contact requesting surgeon's office via preferred method (i.e, phone, fax) to inform them of need for appointment prior to surgery.  Remote hx of RCA stenting back in 2018. If symptomatic during visit, she can hold asa and plavix x 5-7 days prior to multiple dental extractions and resume when medically safe to do so.  Sharlene Dory, PA-C  08/08/2023, 3:07 PM

## 2023-08-08 NOTE — Telephone Encounter (Signed)
Sorry I just saw this message.. I reviewed the patient's chart and she should be able to get teeth pulled-- she is on plavix so she might have additional bleeding-- the dentist will need to take steps to control bleeding.

## 2023-08-08 NOTE — Telephone Encounter (Signed)
Pre-operative Risk Assessment    Patient Name: Erin [name]  DOB: 10/29/1956 MRN: [id number]    Dental Requests If request is for dental extraction, please clarify the number of teeth to be extracted and place under No 1 below.   If the patient is currently at the dentist's office, send SecureChat message to Pre-Op Callback Staff (CMA/nurse) to input urgent request.   If the patient is not currently in the dentist's office, please route to the Pre-Op pool.     Last OV 05/23/22 with Tereso Newcomer, PA Next OV 09/25/23 with Tereso Newcomer, PA.          :1}  Request for Surgical Clearance    Procedure:  Dental Extraction - Amount of Teeth to be Pulled:  4  Date of Surgery:  Clearance TBD                                 Surgeon:  Dr. Juanetta Beets Surgeon's Group or Practice Name:  [facility] Phone number:  [phone] Fax number:  [phone]   Type of Clearance Requested:   - Medical  - Pharmacy:  Hold Aspirin and Clopidogrel (Plavix) pt will need instructions on when/if to hold   Type of Anesthesia:  Not Indicated   Additional requests/questions:  Does this patient need antibiotics?  Signed, Zada Finders   08/08/2023, 11:11 AM

## 2023-08-08 NOTE — Telephone Encounter (Signed)
Spoke with Dahlia Client at [employer] and informed her of the message below.

## 2023-08-09 NOTE — Telephone Encounter (Signed)
Patient scheduled 08/17/23

## 2023-08-16 NOTE — Progress Notes (Unsigned)
Cardiology Office Note    Date:  08/17/2023  ID:  Erin, [name] 03/09/1956, MRN [id number] PCP:  Karie Georges, MD  Cardiologist:  Tonny Bollman, MD  Electrophysiologist:  None   Chief Complaint: Preop cardiovascular exam   History of Present Illness: .    Erin [name] is a 67 y.o. 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   In 09/2017 she presented to the ED with hypokalemia, prolonged QT with bigeminal PVCs. Sh also reported frequent episodes of chest pain and "heaviness" with activity. On 10/13/17 she underwent LHC which showed severe single vessel coronary artery disease with diffusely diseased RCA with eccentric aneurysm followed by tubular 99% stenosis in the mid vessel, treated with PCI to the mid RCA with DES (2.0 x12 mm). 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 Repeat echocardiogram in 01/2018 indicated LVEF 55 to 60%, no RWMA, trivial MR/TR.  She was last seen in office by Tereso Newcomer, PA on 05/23/22 for surgical clearance and follow up. She had remained stable from a cardiac perspective.   Today she presents for follow up and surgical clearance for dental extraction. She reports she is doing well. She denies chest pain or shortness of breath. She notes she is not very active following radiation from rectal cancer and her history of vascular disease. She has had hip pain since radiation that limites her mobility. She endorses problems with vertigo that is controlled with meclizine.    Labwork independently reviewed: 05/10/23: Sodium 133, potassium  4.6, creatinine 0.80, mag 1.8  09/02/22: Total cholesterol 149, triglycerides 86, HDL 66, LDL 66, AST 21, ALT 12, TSH 1.483   ROS: .   Today she denies chest pain, shortness of breath, lower extremity edema, fatigue, palpitations, melena, hematuria, hemoptysis, diaphoresis, weakness, presyncope, syncope, orthopnea, and PND.  All other systems reviewed and are otherwise negative except as noted above.    Studies Reviewed: Marland Kitchen    EKG:  EKG is ordered today, personally reviewed, demonstrating  EKG Interpretation Date/Time:  Thursday August 17 2023 08:10:01 EDT Ventricular Rate:  54 PR Interval:  192 QRS Duration:  72 QT Interval:  426 QTC Calculation: 403 R Axis:   15  Text Interpretation: Sinus bradycardia No acute changes Confirmed by Reather Littler [phone]) on 08/17/2023 8:21:37 AM    CV Studies: Cardiac studies reviewed are outlined and summarized above. Otherwise please see EMR for full report. Cardiac Studies & Procedures   CARDIAC CATHETERIZATION  CARDIAC CATHETERIZATION 10/13/2017  Narrative Conclusions: 1. Severe single vessel coronary artery disease with diffusely diseased RCA with eccentric aneurysm followed by tubular 99% stenosis in the mid vessel. 2. Low normal left ventricular filling pressure. 3. Mid anterior wall akinesis with otherwise preserved left ventricular contraction. LVEF 40-45%. 4. Successful PCI to mid RCA with placement of Resolute Onyx 2.0 x 12 mm drug-eluting stent (post-dilated to 2.3 mm) with 0% residual stenosis and TIMI-3 flow.  Recommendations: 1. Overnight observation; transfer to [facility]. 2. Dual antiplatelet therapy with aspirin and ticagrelor for at least 12 months. 3. Aggressive secondary prevention. 4. Remove left brachial artery sheath with manual compression once ACT has fallen below  surgery: Permanent pacemaker system implantation.  ------------------------------------------------------------------- Study data:  Comparison  was made to the study of 11/02/2017.  Study status:  Routine.  Procedure:  The patient reported no pain pre or post test. Transthoracic echocardiography. Image quality was adequate. Intravenous contrast (Definity) was administered. Transthoracic echocardiography.  M-mode, complete 2D, spectral Doppler, and color Doppler.  Birthdate:  Patient birthdate: June 08, 1956.  Age:  Patient is 67 yr old.  Sex:  Gender: female. BMI: 17.6 kg/m^2.  Blood pressure:     120/41  Patient status: Outpatient.  Study date:  Study date: 01/24/2018. Study time: 11:21 AM.  Location:  [facility]  -------------------------------------------------------------------  ------------------------------------------------------------------- Left ventricle:  The cavity size was normal. Systolic function was normal. The estimated ejection fraction was in the range of 55% to 60%. Wall motion was normal; there were no regional wall motion abnormalities. The transmitral flow pattern was normal. The deceleration time of the early transmitral flow velocity was normal. The pulmonary vein flow pattern was normal. The tissue Doppler parameters were normal. Left ventricular diastolic function parameters were normal.  ------------------------------------------------------------------- Aortic valve:  Poorly visualized. Cannot determined whether functionally bicuspid or tricuspid aortic valve. The valve leaflets are moderate calcified and there appears to be some restriction. No obvious stenosis by Doppler.  Doppler:  Transvalvular velocity was within the normal range. There was no stenosis. There was no regurgitation.  ------------------------------------------------------------------- Aorta:  Aortic root: The aortic root was normal in size.  ------------------------------------------------------------------- Mitral valve:   Structurally normal valve.   Mobility was not restricted.  Doppler:  Transvalvular velocity was  within the normal range. There was no evidence for stenosis. There was trivial regurgitation.  ------------------------------------------------------------------- Left atrium:  The atrium was normal in size.  ------------------------------------------------------------------- Right ventricle:  The cavity size was normal. Wall thickness was normal. Systolic function was normal.  ------------------------------------------------------------------- Pulmonic valve:    Doppler:  Transvalvular velocity was within the normal range. There was no evidence for stenosis.  ------------------------------------------------------------------- Tricuspid valve:   Structurally normal valve.    Doppler: Transvalvular velocity was within the normal range. There was trivial regurgitation.  ------------------------------------------------------------------- Pulmonary artery:   The main pulmonary artery was normal-sized. Systolic pressure was within the normal range.  ------------------------------------------------------------------- Right atrium:  The atrium was normal in size.  ------------------------------------------------------------------- Pericardium:  There was no pericardial effusion.  ------------------------------------------------------------------- Systemic veins: Inferior vena cava: The vessel was normal in size. The respirophasic diameter changes were in the normal range (= 50%), consistent with normal central venous pressure. Diameter: 11 mm.  ------------------------------------------------------------------- Measurements  IVC                                        Value        Reference ID                                         11    mm     ----------  Left ventricle                             Value        Reference LV ID, ED, PLAX chordal            (L)     39.2  surgery: Permanent pacemaker system implantation.  ------------------------------------------------------------------- Study data:  Comparison  was made to the study of 11/02/2017.  Study status:  Routine.  Procedure:  The patient reported no pain pre or post test. Transthoracic echocardiography. Image quality was adequate. Intravenous contrast (Definity) was administered. Transthoracic echocardiography.  M-mode, complete 2D, spectral Doppler, and color Doppler.  Birthdate:  Patient birthdate: June 08, 1956.  Age:  Patient is 67 yr old.  Sex:  Gender: female. BMI: 17.6 kg/m^2.  Blood pressure:     120/41  Patient status: Outpatient.  Study date:  Study date: 01/24/2018. Study time: 11:21 AM.  Location:  [facility]  -------------------------------------------------------------------  ------------------------------------------------------------------- Left ventricle:  The cavity size was normal. Systolic function was normal. The estimated ejection fraction was in the range of 55% to 60%. Wall motion was normal; there were no regional wall motion abnormalities. The transmitral flow pattern was normal. The deceleration time of the early transmitral flow velocity was normal. The pulmonary vein flow pattern was normal. The tissue Doppler parameters were normal. Left ventricular diastolic function parameters were normal.  ------------------------------------------------------------------- Aortic valve:  Poorly visualized. Cannot determined whether functionally bicuspid or tricuspid aortic valve. The valve leaflets are moderate calcified and there appears to be some restriction. No obvious stenosis by Doppler.  Doppler:  Transvalvular velocity was within the normal range. There was no stenosis. There was no regurgitation.  ------------------------------------------------------------------- Aorta:  Aortic root: The aortic root was normal in size.  ------------------------------------------------------------------- Mitral valve:   Structurally normal valve.   Mobility was not restricted.  Doppler:  Transvalvular velocity was  within the normal range. There was no evidence for stenosis. There was trivial regurgitation.  ------------------------------------------------------------------- Left atrium:  The atrium was normal in size.  ------------------------------------------------------------------- Right ventricle:  The cavity size was normal. Wall thickness was normal. Systolic function was normal.  ------------------------------------------------------------------- Pulmonic valve:    Doppler:  Transvalvular velocity was within the normal range. There was no evidence for stenosis.  ------------------------------------------------------------------- Tricuspid valve:   Structurally normal valve.    Doppler: Transvalvular velocity was within the normal range. There was trivial regurgitation.  ------------------------------------------------------------------- Pulmonary artery:   The main pulmonary artery was normal-sized. Systolic pressure was within the normal range.  ------------------------------------------------------------------- Right atrium:  The atrium was normal in size.  ------------------------------------------------------------------- Pericardium:  There was no pericardial effusion.  ------------------------------------------------------------------- Systemic veins: Inferior vena cava: The vessel was normal in size. The respirophasic diameter changes were in the normal range (= 50%), consistent with normal central venous pressure. Diameter: 11 mm.  ------------------------------------------------------------------- Measurements  IVC                                        Value        Reference ID                                         11    mm     ----------  Left ventricle                             Value        Reference LV ID, ED, PLAX chordal            (L)     39.2  surgery: Permanent pacemaker system implantation.  ------------------------------------------------------------------- Study data:  Comparison  was made to the study of 11/02/2017.  Study status:  Routine.  Procedure:  The patient reported no pain pre or post test. Transthoracic echocardiography. Image quality was adequate. Intravenous contrast (Definity) was administered. Transthoracic echocardiography.  M-mode, complete 2D, spectral Doppler, and color Doppler.  Birthdate:  Patient birthdate: June 08, 1956.  Age:  Patient is 67 yr old.  Sex:  Gender: female. BMI: 17.6 kg/m^2.  Blood pressure:     120/41  Patient status: Outpatient.  Study date:  Study date: 01/24/2018. Study time: 11:21 AM.  Location:  [facility]  -------------------------------------------------------------------  ------------------------------------------------------------------- Left ventricle:  The cavity size was normal. Systolic function was normal. The estimated ejection fraction was in the range of 55% to 60%. Wall motion was normal; there were no regional wall motion abnormalities. The transmitral flow pattern was normal. The deceleration time of the early transmitral flow velocity was normal. The pulmonary vein flow pattern was normal. The tissue Doppler parameters were normal. Left ventricular diastolic function parameters were normal.  ------------------------------------------------------------------- Aortic valve:  Poorly visualized. Cannot determined whether functionally bicuspid or tricuspid aortic valve. The valve leaflets are moderate calcified and there appears to be some restriction. No obvious stenosis by Doppler.  Doppler:  Transvalvular velocity was within the normal range. There was no stenosis. There was no regurgitation.  ------------------------------------------------------------------- Aorta:  Aortic root: The aortic root was normal in size.  ------------------------------------------------------------------- Mitral valve:   Structurally normal valve.   Mobility was not restricted.  Doppler:  Transvalvular velocity was  within the normal range. There was no evidence for stenosis. There was trivial regurgitation.  ------------------------------------------------------------------- Left atrium:  The atrium was normal in size.  ------------------------------------------------------------------- Right ventricle:  The cavity size was normal. Wall thickness was normal. Systolic function was normal.  ------------------------------------------------------------------- Pulmonic valve:    Doppler:  Transvalvular velocity was within the normal range. There was no evidence for stenosis.  ------------------------------------------------------------------- Tricuspid valve:   Structurally normal valve.    Doppler: Transvalvular velocity was within the normal range. There was trivial regurgitation.  ------------------------------------------------------------------- Pulmonary artery:   The main pulmonary artery was normal-sized. Systolic pressure was within the normal range.  ------------------------------------------------------------------- Right atrium:  The atrium was normal in size.  ------------------------------------------------------------------- Pericardium:  There was no pericardial effusion.  ------------------------------------------------------------------- Systemic veins: Inferior vena cava: The vessel was normal in size. The respirophasic diameter changes were in the normal range (= 50%), consistent with normal central venous pressure. Diameter: 11 mm.  ------------------------------------------------------------------- Measurements  IVC                                        Value        Reference ID                                         11    mm     ----------  Left ventricle                             Value        Reference LV ID, ED, PLAX chordal            (L)     39.2  Cardiology Office Note    Date:  08/17/2023  ID:  Erin, [name] 03/09/1956, MRN [id number] PCP:  Karie Georges, MD  Cardiologist:  Tonny Bollman, MD  Electrophysiologist:  None   Chief Complaint: Preop cardiovascular exam   History of Present Illness: .    Erin [name] is a 67 y.o. 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   In 09/2017 she presented to the ED with hypokalemia, prolonged QT with bigeminal PVCs. Sh also reported frequent episodes of chest pain and "heaviness" with activity. On 10/13/17 she underwent LHC which showed severe single vessel coronary artery disease with diffusely diseased RCA with eccentric aneurysm followed by tubular 99% stenosis in the mid vessel, treated with PCI to the mid RCA with DES (2.0 x12 mm). 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 Repeat echocardiogram in 01/2018 indicated LVEF 55 to 60%, no RWMA, trivial MR/TR.  She was last seen in office by Tereso Newcomer, PA on 05/23/22 for surgical clearance and follow up. She had remained stable from a cardiac perspective.   Today she presents for follow up and surgical clearance for dental extraction. She reports she is doing well. She denies chest pain or shortness of breath. She notes she is not very active following radiation from rectal cancer and her history of vascular disease. She has had hip pain since radiation that limites her mobility. She endorses problems with vertigo that is controlled with meclizine.    Labwork independently reviewed: 05/10/23: Sodium 133, potassium  4.6, creatinine 0.80, mag 1.8  09/02/22: Total cholesterol 149, triglycerides 86, HDL 66, LDL 66, AST 21, ALT 12, TSH 1.483   ROS: .   Today she denies chest pain, shortness of breath, lower extremity edema, fatigue, palpitations, melena, hematuria, hemoptysis, diaphoresis, weakness, presyncope, syncope, orthopnea, and PND.  All other systems reviewed and are otherwise negative except as noted above.    Studies Reviewed: Marland Kitchen    EKG:  EKG is ordered today, personally reviewed, demonstrating  EKG Interpretation Date/Time:  Thursday August 17 2023 08:10:01 EDT Ventricular Rate:  54 PR Interval:  192 QRS Duration:  72 QT Interval:  426 QTC Calculation: 403 R Axis:   15  Text Interpretation: Sinus bradycardia No acute changes Confirmed by Reather Littler [phone]) on 08/17/2023 8:21:37 AM    CV Studies: Cardiac studies reviewed are outlined and summarized above. Otherwise please see EMR for full report. Cardiac Studies & Procedures   CARDIAC CATHETERIZATION  CARDIAC CATHETERIZATION 10/13/2017  Narrative Conclusions: 1. Severe single vessel coronary artery disease with diffusely diseased RCA with eccentric aneurysm followed by tubular 99% stenosis in the mid vessel. 2. Low normal left ventricular filling pressure. 3. Mid anterior wall akinesis with otherwise preserved left ventricular contraction. LVEF 40-45%. 4. Successful PCI to mid RCA with placement of Resolute Onyx 2.0 x 12 mm drug-eluting stent (post-dilated to 2.3 mm) with 0% residual stenosis and TIMI-3 flow.  Recommendations: 1. Overnight observation; transfer to [facility]. 2. Dual antiplatelet therapy with aspirin and ticagrelor for at least 12 months. 3. Aggressive secondary prevention. 4. Remove left brachial artery sheath with manual compression once ACT has fallen below  surgery: Permanent pacemaker system implantation.  ------------------------------------------------------------------- Study data:  Comparison  was made to the study of 11/02/2017.  Study status:  Routine.  Procedure:  The patient reported no pain pre or post test. Transthoracic echocardiography. Image quality was adequate. Intravenous contrast (Definity) was administered. Transthoracic echocardiography.  M-mode, complete 2D, spectral Doppler, and color Doppler.  Birthdate:  Patient birthdate: June 08, 1956.  Age:  Patient is 67 yr old.  Sex:  Gender: female. BMI: 17.6 kg/m^2.  Blood pressure:     120/41  Patient status: Outpatient.  Study date:  Study date: 01/24/2018. Study time: 11:21 AM.  Location:  [facility]  -------------------------------------------------------------------  ------------------------------------------------------------------- Left ventricle:  The cavity size was normal. Systolic function was normal. The estimated ejection fraction was in the range of 55% to 60%. Wall motion was normal; there were no regional wall motion abnormalities. The transmitral flow pattern was normal. The deceleration time of the early transmitral flow velocity was normal. The pulmonary vein flow pattern was normal. The tissue Doppler parameters were normal. Left ventricular diastolic function parameters were normal.  ------------------------------------------------------------------- Aortic valve:  Poorly visualized. Cannot determined whether functionally bicuspid or tricuspid aortic valve. The valve leaflets are moderate calcified and there appears to be some restriction. No obvious stenosis by Doppler.  Doppler:  Transvalvular velocity was within the normal range. There was no stenosis. There was no regurgitation.  ------------------------------------------------------------------- Aorta:  Aortic root: The aortic root was normal in size.  ------------------------------------------------------------------- Mitral valve:   Structurally normal valve.   Mobility was not restricted.  Doppler:  Transvalvular velocity was  within the normal range. There was no evidence for stenosis. There was trivial regurgitation.  ------------------------------------------------------------------- Left atrium:  The atrium was normal in size.  ------------------------------------------------------------------- Right ventricle:  The cavity size was normal. Wall thickness was normal. Systolic function was normal.  ------------------------------------------------------------------- Pulmonic valve:    Doppler:  Transvalvular velocity was within the normal range. There was no evidence for stenosis.  ------------------------------------------------------------------- Tricuspid valve:   Structurally normal valve.    Doppler: Transvalvular velocity was within the normal range. There was trivial regurgitation.  ------------------------------------------------------------------- Pulmonary artery:   The main pulmonary artery was normal-sized. Systolic pressure was within the normal range.  ------------------------------------------------------------------- Right atrium:  The atrium was normal in size.  ------------------------------------------------------------------- Pericardium:  There was no pericardial effusion.  ------------------------------------------------------------------- Systemic veins: Inferior vena cava: The vessel was normal in size. The respirophasic diameter changes were in the normal range (= 50%), consistent with normal central venous pressure. Diameter: 11 mm.  ------------------------------------------------------------------- Measurements  IVC                                        Value        Reference ID                                         11    mm     ----------  Left ventricle                             Value        Reference LV ID, ED, PLAX chordal            (L)     39.2  surgery: Permanent pacemaker system implantation.  ------------------------------------------------------------------- Study data:  Comparison  was made to the study of 11/02/2017.  Study status:  Routine.  Procedure:  The patient reported no pain pre or post test. Transthoracic echocardiography. Image quality was adequate. Intravenous contrast (Definity) was administered. Transthoracic echocardiography.  M-mode, complete 2D, spectral Doppler, and color Doppler.  Birthdate:  Patient birthdate: June 08, 1956.  Age:  Patient is 67 yr old.  Sex:  Gender: female. BMI: 17.6 kg/m^2.  Blood pressure:     120/41  Patient status: Outpatient.  Study date:  Study date: 01/24/2018. Study time: 11:21 AM.  Location:  [facility]  -------------------------------------------------------------------  ------------------------------------------------------------------- Left ventricle:  The cavity size was normal. Systolic function was normal. The estimated ejection fraction was in the range of 55% to 60%. Wall motion was normal; there were no regional wall motion abnormalities. The transmitral flow pattern was normal. The deceleration time of the early transmitral flow velocity was normal. The pulmonary vein flow pattern was normal. The tissue Doppler parameters were normal. Left ventricular diastolic function parameters were normal.  ------------------------------------------------------------------- Aortic valve:  Poorly visualized. Cannot determined whether functionally bicuspid or tricuspid aortic valve. The valve leaflets are moderate calcified and there appears to be some restriction. No obvious stenosis by Doppler.  Doppler:  Transvalvular velocity was within the normal range. There was no stenosis. There was no regurgitation.  ------------------------------------------------------------------- Aorta:  Aortic root: The aortic root was normal in size.  ------------------------------------------------------------------- Mitral valve:   Structurally normal valve.   Mobility was not restricted.  Doppler:  Transvalvular velocity was  within the normal range. There was no evidence for stenosis. There was trivial regurgitation.  ------------------------------------------------------------------- Left atrium:  The atrium was normal in size.  ------------------------------------------------------------------- Right ventricle:  The cavity size was normal. Wall thickness was normal. Systolic function was normal.  ------------------------------------------------------------------- Pulmonic valve:    Doppler:  Transvalvular velocity was within the normal range. There was no evidence for stenosis.  ------------------------------------------------------------------- Tricuspid valve:   Structurally normal valve.    Doppler: Transvalvular velocity was within the normal range. There was trivial regurgitation.  ------------------------------------------------------------------- Pulmonary artery:   The main pulmonary artery was normal-sized. Systolic pressure was within the normal range.  ------------------------------------------------------------------- Right atrium:  The atrium was normal in size.  ------------------------------------------------------------------- Pericardium:  There was no pericardial effusion.  ------------------------------------------------------------------- Systemic veins: Inferior vena cava: The vessel was normal in size. The respirophasic diameter changes were in the normal range (= 50%), consistent with normal central venous pressure. Diameter: 11 mm.  ------------------------------------------------------------------- Measurements  IVC                                        Value        Reference ID                                         11    mm     ----------  Left ventricle                             Value        Reference LV ID, ED, PLAX chordal            (L)     39.2  surgery: Permanent pacemaker system implantation.  ------------------------------------------------------------------- Study data:  Comparison  was made to the study of 11/02/2017.  Study status:  Routine.  Procedure:  The patient reported no pain pre or post test. Transthoracic echocardiography. Image quality was adequate. Intravenous contrast (Definity) was administered. Transthoracic echocardiography.  M-mode, complete 2D, spectral Doppler, and color Doppler.  Birthdate:  Patient birthdate: June 08, 1956.  Age:  Patient is 67 yr old.  Sex:  Gender: female. BMI: 17.6 kg/m^2.  Blood pressure:     120/41  Patient status: Outpatient.  Study date:  Study date: 01/24/2018. Study time: 11:21 AM.  Location:  [facility]  -------------------------------------------------------------------  ------------------------------------------------------------------- Left ventricle:  The cavity size was normal. Systolic function was normal. The estimated ejection fraction was in the range of 55% to 60%. Wall motion was normal; there were no regional wall motion abnormalities. The transmitral flow pattern was normal. The deceleration time of the early transmitral flow velocity was normal. The pulmonary vein flow pattern was normal. The tissue Doppler parameters were normal. Left ventricular diastolic function parameters were normal.  ------------------------------------------------------------------- Aortic valve:  Poorly visualized. Cannot determined whether functionally bicuspid or tricuspid aortic valve. The valve leaflets are moderate calcified and there appears to be some restriction. No obvious stenosis by Doppler.  Doppler:  Transvalvular velocity was within the normal range. There was no stenosis. There was no regurgitation.  ------------------------------------------------------------------- Aorta:  Aortic root: The aortic root was normal in size.  ------------------------------------------------------------------- Mitral valve:   Structurally normal valve.   Mobility was not restricted.  Doppler:  Transvalvular velocity was  within the normal range. There was no evidence for stenosis. There was trivial regurgitation.  ------------------------------------------------------------------- Left atrium:  The atrium was normal in size.  ------------------------------------------------------------------- Right ventricle:  The cavity size was normal. Wall thickness was normal. Systolic function was normal.  ------------------------------------------------------------------- Pulmonic valve:    Doppler:  Transvalvular velocity was within the normal range. There was no evidence for stenosis.  ------------------------------------------------------------------- Tricuspid valve:   Structurally normal valve.    Doppler: Transvalvular velocity was within the normal range. There was trivial regurgitation.  ------------------------------------------------------------------- Pulmonary artery:   The main pulmonary artery was normal-sized. Systolic pressure was within the normal range.  ------------------------------------------------------------------- Right atrium:  The atrium was normal in size.  ------------------------------------------------------------------- Pericardium:  There was no pericardial effusion.  ------------------------------------------------------------------- Systemic veins: Inferior vena cava: The vessel was normal in size. The respirophasic diameter changes were in the normal range (= 50%), consistent with normal central venous pressure. Diameter: 11 mm.  ------------------------------------------------------------------- Measurements  IVC                                        Value        Reference ID                                         11    mm     ----------  Left ventricle                             Value        Reference LV ID, ED, PLAX chordal            (L)     39.2

## 2023-08-17 ENCOUNTER — Ambulatory Visit: Payer: Medicare Other | Attending: Nurse Practitioner | Admitting: Cardiology

## 2023-08-17 ENCOUNTER — Encounter: Payer: Self-pay | Admitting: Nurse Practitioner

## 2023-08-17 VITALS — BP 134/72 | HR 54 | Ht 63.0 in | Wt 116.4 lb

## 2023-08-17 DIAGNOSIS — I5032 Chronic diastolic (congestive) heart failure: Secondary | ICD-10-CM | POA: Diagnosis not present

## 2023-08-17 DIAGNOSIS — I739 Peripheral vascular disease, unspecified: Secondary | ICD-10-CM | POA: Diagnosis not present

## 2023-08-17 DIAGNOSIS — I2510 Atherosclerotic heart disease of native coronary artery without angina pectoris: Secondary | ICD-10-CM

## 2023-08-17 DIAGNOSIS — Z01810 Encounter for preprocedural cardiovascular examination: Secondary | ICD-10-CM

## 2023-08-17 DIAGNOSIS — E785 Hyperlipidemia, unspecified: Secondary | ICD-10-CM | POA: Diagnosis not present

## 2023-08-17 NOTE — Patient Instructions (Signed)
Medication Instructions:  No changes *If you need a refill on your cardiac medications before your next appointment, please call your pharmacy*   Lab Work: None ordered If you have labs (blood work) drawn today and your tests are completely normal, you will receive your results only by: MyChart Message (if you have MyChart) OR A paper copy in the mail If you have any lab test that is abnormal or we need to change your treatment, we will call you to review the results.   Testing/Procedures: None ordered   Follow-Up: At [facility], you and your health needs are our priority.  As part of our continuing mission to provide you with exceptional heart care, we have created designated Provider Care Teams.  These Care Teams include your primary Cardiologist (physician) and Advanced Practice Providers (APPs -  Physician Assistants and Nurse Practitioners) who all work together to provide you with the care you need, when you need it.  We recommend signing up for the patient portal called "MyChart".  Sign up information is provided on this After Visit Summary.  MyChart is used to connect with patients for Virtual Visits (Telemedicine).  Patients are able to view lab/test results, encounter notes, upcoming appointments, etc.  Non-urgent messages can be sent to your provider as well.   To learn more about what you can do with MyChart, go to [email or web address].    Your next appointment:   Keep follow up as scheduled  Other Instructions Check your blood pressure daily. Call the office if your blood pressure is consistently over 130/80 at [phone]  It is best to check your BP 1-2 hours after taking your medications to see the medications effectiveness on your BP.    Here are some tips that our clinical pharmacists share for home BP monitoring:          Rest 10 minutes before taking your blood pressure.          Don't smoke or drink caffeinated beverages for at least 30 minutes  before.          Take your blood pressure before (not after) you eat.          Sit comfortably with your back supported and both feet on the floor (don't cross your legs).          Elevate your arm to heart level on a table or a desk.          Use the proper sized cuff. It should fit smoothly and snugly around your bare upper arm. There should be enough room to slip a fingertip under the cuff. The bottom edge of the cuff should be 1 inch above the crease of the elbow.

## 2023-08-19 ENCOUNTER — Other Ambulatory Visit: Payer: Self-pay | Admitting: Family Medicine

## 2023-08-23 ENCOUNTER — Encounter (INDEPENDENT_AMBULATORY_CARE_PROVIDER_SITE_OTHER): Payer: Medicare Other | Admitting: Ophthalmology

## 2023-08-23 DIAGNOSIS — H3581 Retinal edema: Secondary | ICD-10-CM

## 2023-09-12 ENCOUNTER — Other Ambulatory Visit: Payer: Self-pay | Admitting: Cardiovascular Disease

## 2023-09-12 ENCOUNTER — Other Ambulatory Visit: Payer: Self-pay | Admitting: Family Medicine

## 2023-09-12 ENCOUNTER — Other Ambulatory Visit: Payer: Self-pay | Admitting: Gastroenterology

## 2023-09-12 DIAGNOSIS — I739 Peripheral vascular disease, unspecified: Secondary | ICD-10-CM

## 2023-09-12 DIAGNOSIS — G2581 Restless legs syndrome: Secondary | ICD-10-CM

## 2023-09-15 ENCOUNTER — Telehealth: Payer: Self-pay

## 2023-09-15 NOTE — Telephone Encounter (Signed)
Unsuccessful attempt to reach patient on preferred number listed in notes for scheduled AWV. Left message on voicemail okay to reschedule. 

## 2023-09-18 NOTE — Telephone Encounter (Signed)
Pt called to F/U on these refill requests, stating she is completely out.

## 2023-09-20 ENCOUNTER — Other Ambulatory Visit: Payer: Self-pay | Admitting: Cardiovascular Disease

## 2023-09-25 ENCOUNTER — Ambulatory Visit: Payer: Medicare Other | Attending: Physician Assistant | Admitting: Physician Assistant

## 2023-09-25 ENCOUNTER — Encounter: Payer: Self-pay | Admitting: Physician Assistant

## 2023-09-25 ENCOUNTER — Other Ambulatory Visit: Payer: Self-pay | Admitting: *Deleted

## 2023-09-25 VITALS — BP 146/76 | HR 78 | Ht 63.0 in | Wt 113.8 lb

## 2023-09-25 DIAGNOSIS — K551 Chronic vascular disorders of intestine: Secondary | ICD-10-CM

## 2023-09-25 DIAGNOSIS — I5032 Chronic diastolic (congestive) heart failure: Secondary | ICD-10-CM

## 2023-09-25 DIAGNOSIS — I10 Essential (primary) hypertension: Secondary | ICD-10-CM | POA: Insufficient documentation

## 2023-09-25 DIAGNOSIS — E7800 Pure hypercholesterolemia, unspecified: Secondary | ICD-10-CM

## 2023-09-25 DIAGNOSIS — I2510 Atherosclerotic heart disease of native coronary artery without angina pectoris: Secondary | ICD-10-CM

## 2023-09-25 DIAGNOSIS — I739 Peripheral vascular disease, unspecified: Secondary | ICD-10-CM | POA: Diagnosis not present

## 2023-09-25 DIAGNOSIS — R002 Palpitations: Secondary | ICD-10-CM | POA: Diagnosis not present

## 2023-09-25 HISTORY — DX: Essential (primary) hypertension: I10

## 2023-09-25 NOTE — Assessment & Plan Note (Signed)
S/p DES to RCA in 2018. She is doing well w/o chest pain to suggest angina. She had previously been managed with aspirin and low-dose rivaroxaban.  However, this was changed back to aspirin and Plavix in 2021.  She is tolerating dual antiplatelet therapy well. -Continue aspirin 81 mg daily, Plavix 75 mg daily, Crestor 40 mg daily, nitroglycerin as needed -Obtain CMET, CBC -Follow-up 1 year

## 2023-09-25 NOTE — Assessment & Plan Note (Signed)
Obtain follow-up CMET, fasting lipids.  Continue Crestor 40 mg daily.

## 2023-09-25 NOTE — Assessment & Plan Note (Signed)
Blood pressure above target today.  Continue Toprol-XL 25 mg twice daily.  Monitor blood pressure for 1 week and send readings for review.  Consider adding amlodipine if blood pressure remains above target.

## 2023-09-25 NOTE — Assessment & Plan Note (Signed)
History of prior balloon angioplasty to the SMA and celiac artery in April 2021.  She has not had any abdominal pain with meals.

## 2023-09-25 NOTE — Assessment & Plan Note (Signed)
History of aortobifem and bilateral femoral to popliteal bypasses in September 2018.  She has chronic hip pain but no symptoms suggestive of recurrent claudication.  Her last follow-up with vascular surgery was last year.  I encouraged her to contact vascular surgery for follow-up.

## 2023-09-25 NOTE — Progress Notes (Signed)
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 Myra Gianotti) S/p aortobifem and bilateral femoral to popliteal bypasses 07/2017 Chronic mesenteric ischemia S/p balloon angioplasty to SMA and celiac artery in 02/2020 (Dr. Myra Gianotti) Hypokalemia Anal CA s/p XRT 2014 Diabetes mellitus Hyperlipidemia         History of Present Illness:  Discussed the use of AI scribe software for clinical note transcription with the patient, who gave verbal consent to proceed.  ROLONDA [name] is a 67 y.o. female who returns for follow-up of CAD, CHF.  She was last seen in 07/2023 for surgical clearance.  She is here alone.  Overall, she is doing well.  She did have some chest discomfort several months ago but has not had any since.  She went through her oral surgery without difficulty.  She does get short of breath with certain activities.  This is unchanged.  She has a lot of hip problems since her cancer treatment.  This is unchanged.  She has not had syncope, orthopnea, leg edema.  She does note occasional palpitations.  These are less than a minute and occur about once a week.  She has not followed up with vascular surgery in over a year.  She has not had any abdominal pain  with eating.    Review of Systems  Gastrointestinal:  Negative for hematochezia and melena.  Genitourinary:  Negative for hematuria.  See HPI     Studies Reviewed:       Labs - Chart Review 09/02/22: LDL 66           Risk Assessment/Calculations:     HYPERTENSION CONTROL Vitals:   09/25/23 1540 09/25/23 1701  BP: (!) 148/70 (!) 146/76    The patient's blood pressure is elevated above target today.  In order to address the patient's elevated BP: Blood pressure will be monitored at home to determine if medication changes need to be made.          Physical Exam:   VS:  BP (!) 146/76   Pulse 78   Ht 5\' 3"  (1.6 m)   Wt 113 lb 12.8 oz (51.6 kg)   SpO2 97%   BMI 20.16 kg/m    Wt Readings from Last 3 Encounters:  09/25/23 113 lb 12.8 oz (51.6 kg)  08/17/23 116 lb 6.4 oz (52.8 kg)  09/16/22 114 lb (51.7 kg)    Constitutional:      Appearance: Healthy appearance. Not in distress.  Neck:     Vascular: JVD normal.  Pulmonary:     Breath sounds: Normal breath sounds. No wheezing. No rales.  Cardiovascular:     Normal rate. Regular rhythm.     Murmurs: There is no murmur.  Edema:  Peripheral edema absent.  Abdominal:     Palpations: Abdomen is soft.        Assessment and Plan:   Assessment & Plan Coronary artery disease involving native coronary artery of native heart without angina pectoris S/p DES to RCA in 2018. She is doing well w/o chest pain to suggest angina. She had previously been managed with aspirin and low-dose rivaroxaban.  However, this was changed back to aspirin and Plavix in 2021.  She is tolerating dual antiplatelet therapy well. -Continue aspirin 81 mg daily, Plavix 75 mg daily, Crestor 40 mg daily, nitroglycerin as needed -Obtain CMET, CBC -Follow-up 1 year HFimpEF (heart failure with improved EF) History of stress-induced cardiomyopathy with return of normal LV function. Peripheral vascular disease (HCC) History of aortobifem and bilateral  femoral to popliteal bypasses in September 2018.  She has chronic hip pain but no symptoms suggestive of recurrent claudication.  Her last follow-up with vascular surgery was last year.  I encouraged her to contact vascular surgery for follow-up. Mesenteric artery stenosis (HCC) History of prior balloon angioplasty to the SMA and celiac artery in April 2021.  She has not had any abdominal pain with meals. Pure hypercholesterolemia Obtain follow-up CMET, fasting lipids.  Continue Crestor 40 mg daily. Essential hypertension Blood pressure above target today.  Continue Toprol-XL 25 mg twice daily.  Monitor blood pressure for 1 week and send readings for review.  Consider adding amlodipine if blood pressure remains above target. Palpitations I offered to arrange 2-week cardiac monitor.  She prefers to hold off on this.  Obtain CMET, TSH.  She knows to contact me if she changes her mind about the monitor.       Dispo:  Return in about 1 year (around 09/24/2024) for Routine Follow Up, w/ Tereso Newcomer, PA-C.  Signed, Tereso Newcomer, PA-C

## 2023-09-25 NOTE — Patient Instructions (Addendum)
Medication Instructions:  Your physician recommends that you continue on your current medications as directed. Please refer to the Current Medication list given to you today.  *If you need a refill on your cardiac medications before your next appointment, please call your pharmacy*   Lab Work: GO TO [place], FASTING, FOR:  LIPID, CMET, CBC, & TSH  If you have labs (blood work) drawn today and your tests are completely normal, you will receive your results only by: MyChart Message (if you have MyChart) OR A paper copy in the mail If you have any lab test that is abnormal or we need to change your treatment, we will call you to review the results.   Testing/Procedures: None ordered    Call [facility] and make a follow-up appt:  [phone] Follow-Up: At [facility], you and your health needs are our priority.  As part of our continuing mission to provide you with exceptional heart care, we have created designated Provider Care Teams.  These Care Teams include your primary Cardiologist (physician) and Advanced Practice Providers (APPs -  Physician Assistants and Nurse Practitioners) who all work together to provide you with the care you need, when you need it.  We recommend signing up for the patient portal called "MyChart".  Sign up information is provided on this After Visit Summary.  MyChart is used to connect with patients for Virtual Visits (Telemedicine).  Patients are able to view lab/test results, encounter notes, upcoming appointments, etc.  Non-urgent messages can be sent to your provider as well.   To learn more about what you can do with MyChart, go to [email or web address].    Your next appointment:   1 year(s)  Provider:   Tereso Newcomer, PA-C         Other Instructions Your physician has requested that you regularly monitor and record your blood pressure readings at home. Please use the same machine at the same time of day to check your readings and record them to  bring to your follow-up visit.   Please monitor blood pressures and keep a log of your readings for 1 weeks, then send a mychart with the readings.     Make sure to check 2 hours after your medications.    AVOID these things for 30 minutes before checking your blood pressure: No Drinking caffeine. No Drinking alcohol. No Eating. No Smoking. No Exercising.   Five minutes before checking your blood pressure: Pee. Sit in a dining chair. Avoid sitting in a soft couch or armchair. Be quiet. Do not talk

## 2023-09-25 NOTE — Assessment & Plan Note (Signed)
History of stress-induced cardiomyopathy with return of normal LV function.

## 2023-09-29 ENCOUNTER — Other Ambulatory Visit: Payer: Self-pay | Admitting: *Deleted

## 2023-09-29 ENCOUNTER — Telehealth: Payer: Self-pay | Admitting: Cardiovascular Disease

## 2023-09-29 ENCOUNTER — Other Ambulatory Visit (HOSPITAL_COMMUNITY)
Admission: RE | Admit: 2023-09-29 | Discharge: 2023-09-29 | Disposition: A | Discharge status: Home / Self Care | Payer: Medicare Other | Source: Ambulatory Visit | Attending: Physician Assistant | Admitting: Physician Assistant

## 2023-09-29 DIAGNOSIS — I2510 Atherosclerotic heart disease of native coronary artery without angina pectoris: Secondary | ICD-10-CM | POA: Insufficient documentation

## 2023-09-29 DIAGNOSIS — I5032 Chronic diastolic (congestive) heart failure: Secondary | ICD-10-CM | POA: Insufficient documentation

## 2023-09-29 DIAGNOSIS — Z79899 Other long term (current) drug therapy: Secondary | ICD-10-CM

## 2023-09-29 LAB — CBC
HCT: 39.3 % (ref 36.0–46.0)
Hemoglobin: 12.2 g/dL (ref 12.0–15.0)
MCH: 31.4 pg (ref 26.0–34.0)
MCHC: 31 g/dL (ref 30.0–36.0)
MCV: 101 fL — ABNORMAL HIGH (ref 80.0–100.0)
Platelets: 315 10*3/uL (ref 150–400)
RBC: 3.89 MIL/uL (ref 3.87–5.11)
RDW: 14.7 % (ref 11.5–15.5)
WBC: 6.4 10*3/uL (ref 4.0–10.5)
nRBC: 0 % (ref 0.0–0.2)

## 2023-09-29 LAB — COMPREHENSIVE METABOLIC PANEL
ALT: 11 U/L (ref 0–44)
AST: 19 U/L (ref 15–41)
Albumin: 4.1 g/dL (ref 3.5–5.0)
Alkaline Phosphatase: 77 U/L (ref 38–126)
Anion gap: 10 (ref 5–15)
BUN: 12 mg/dL (ref 8–23)
CO2: 24 mmol/L (ref 22–32)
Calcium: 9.4 mg/dL (ref 8.9–10.3)
Chloride: 103 mmol/L (ref 98–111)
Creatinine, Ser: 0.73 mg/dL (ref 0.44–1.00)
GFR, Estimated: >60 mL/min (ref >60)
Glucose, Bld: 97 mg/dL (ref 70–99)
Potassium: 3.9 mmol/L (ref 3.5–5.1)
Sodium: 137 mmol/L (ref 135–145)
Total Bilirubin: 0.4 mg/dL (ref <1.2)
Total Protein: 7.2 g/dL (ref 6.5–8.1)

## 2023-09-29 LAB — TSH: TSH: 11.706 u[IU]/mL — ABNORMAL HIGH (ref 0.350–4.500)

## 2023-09-29 LAB — LIPID PANEL
Cholesterol: 156 mg/dL (ref 0–200)
HDL: 52 mg/dL (ref >40)
LDL Cholesterol: 68 mg/dL (ref 0–99)
Total CHOL/HDL Ratio: 3 {ratio}
Triglycerides: 180 mg/dL — ABNORMAL HIGH (ref <150)
VLDL: 36 mg/dL (ref 0–40)

## 2023-09-29 NOTE — Telephone Encounter (Signed)
Left pt a message to call back.  Need to clarify what pharmacy.

## 2023-09-29 NOTE — Telephone Encounter (Signed)
-----   Message from Tereso Newcomer sent at 09/29/2023 12:22 PM EST ----- Results sent to Erin [name] via MyChart. See MyChart comments below. PLAN:  -Add Zetia 10 mg daily -Lipids, ALT 3 months -Increase Synthroid to 75 mcg every day -Arrange follow-up with PCP to manage hypothyroidism further (I will fwd to Karie Georges, MD as Lorain Childes)  Erin [name]  Your triglycerides are slightly elevated.  Continue to work on diet to reduce this.  Your LDL cholesterol is good but is above goal.  Goal is <55.  I will start you on a medication called Zetia 10 mg daily in addition to your other medications to get this lowered.  I will recheck labs again in 3 months.  Your thyroid test (TSH) is elevated.  This indicates that your thyroid replacement is not adequate.  I see that you take your Synthroid half a tablet on Saturday and Sunday.  I would like you to increase your Synthroid to a whole tablet (75 mcg) every day.  Your blood count (hemoglobin) is normal.  Your potassium, kidney function (creatinine), liver enzymes (AST, ALT) are normal. Tereso Newcomer, PA-C

## 2023-09-29 NOTE — Telephone Encounter (Signed)
Calling in with bp readings  Tuesday 147/71 Wednesday 166/82 Thursday 170/75 Friday 150/76  Please advise

## 2023-10-02 MED ORDER — AMLODIPINE BESYLATE 5 MG PO TABS: 5.0000 mg | ORAL_TABLET | Freq: Every day | ORAL | 3 refills | Status: DC

## 2023-10-02 NOTE — Telephone Encounter (Signed)
Called and spoke with patient who agrees to plan to begin Amlodipine. Prescription sent to pharmacy on file. Pt understands to monitor BP and will send [place] readings in after approx. 1 month as directed.

## 2023-10-02 NOTE — Telephone Encounter (Signed)
Pt last seen by Alben Spittle, PA on 09/25/23: Blood pressure above target today. Continue Toprol-XL 25 mg twice daily. Monitor blood pressure for 1 week and send readings for review. Consider adding amlodipine if blood pressure remains above target.   Routing to [facility] [place] for follow-up.

## 2023-10-02 NOTE — Telephone Encounter (Signed)
Start Amlodipine 5 mg once daily In 1 month, collect 1 week of BP readings and send to me. Tereso Newcomer, PA-C    10/02/2023 11:42 AM

## 2023-10-04 ENCOUNTER — Telehealth: Payer: Self-pay | Admitting: *Deleted

## 2023-10-04 MED ORDER — LEVOTHYROXINE SODIUM 75 MCG PO TABS: 75.0000 ug | ORAL_TABLET | Freq: Every day | ORAL | Status: DC

## 2023-10-04 NOTE — Telephone Encounter (Signed)
Pt returning nurses phone call from 11/8. Please advise

## 2023-10-04 NOTE — Telephone Encounter (Signed)
Call placed to pt regarding referring to Pharm D re: PCSK9 inhibitor and pt declined stating she will try to lower it another way.

## 2023-10-04 NOTE — Telephone Encounter (Signed)
Returned call to pt. She is aware to increase her Synthroid to 1 daily every day and follow-up with her PCP.

## 2023-10-04 NOTE — Telephone Encounter (Signed)
-----   Message from Tereso Newcomer sent at 10/04/2023  1:34 PM EST ----- DC Zetia Let's refer her to [facility] to start PCSK9 inhibitor. Tereso Newcomer, PA-C    10/04/2023 1:33 PM

## 2023-10-10 IMAGING — RF DG SWALLOWING FUNCTION
12 of 24 series · 12 of 24 positions shown · non-contrast
Comparison: None Available.

CLINICAL DATA: Dysphagia.

EXAM:
MODIFIED BARIUM SWALLOW
TECHNIQUE: Different consistencies of barium were administered orally to the
patient by the Speech Pathologist. Imaging of the pharynx was
performed in the lateral projection by Mine Durante, PA. The
radiologist provided supervision.
FLUOROSCOPY:
Radiation Exposure Index (as provided by the fluoroscopic device):
22.26 mGy Kerma

[Series 2: run · 1 of 21 frames shown (1 of 12)]
[frame 4/21]
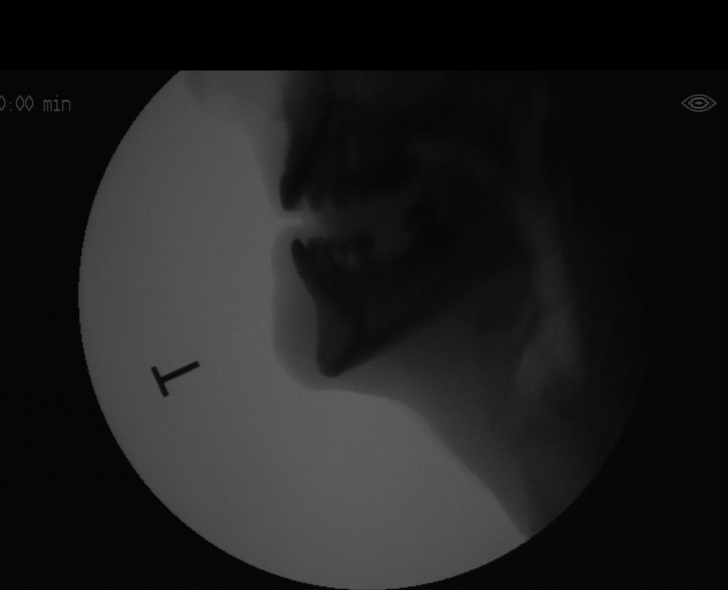

[Series 4: run · 1 of 19 frames shown (2 of 12)]
[frame 3/19]
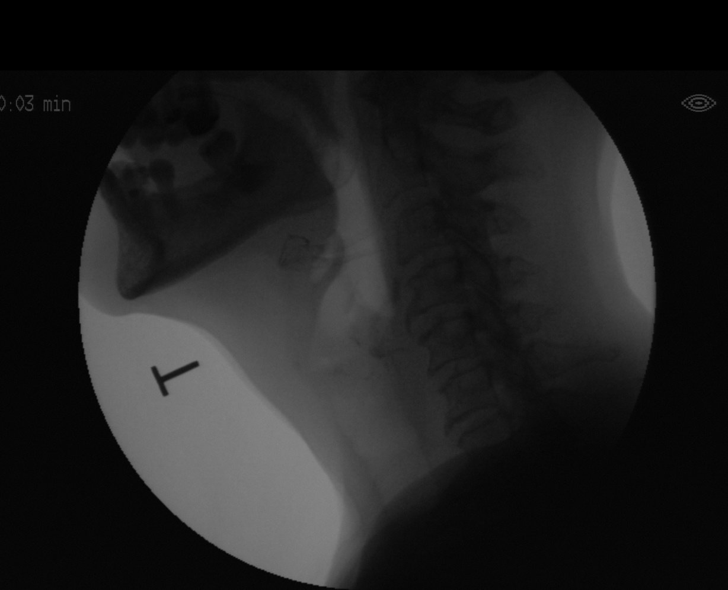

[Series 6: run · 1 of 36 frames shown (3 of 12)]
[frame 6/36]
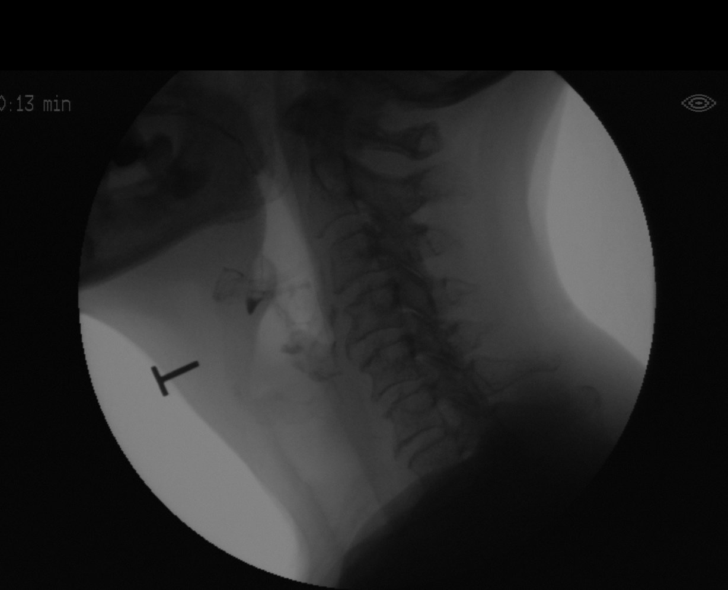

[Series 8: run · 1 of 44 frames shown (4 of 12)]
[frame 7/44]
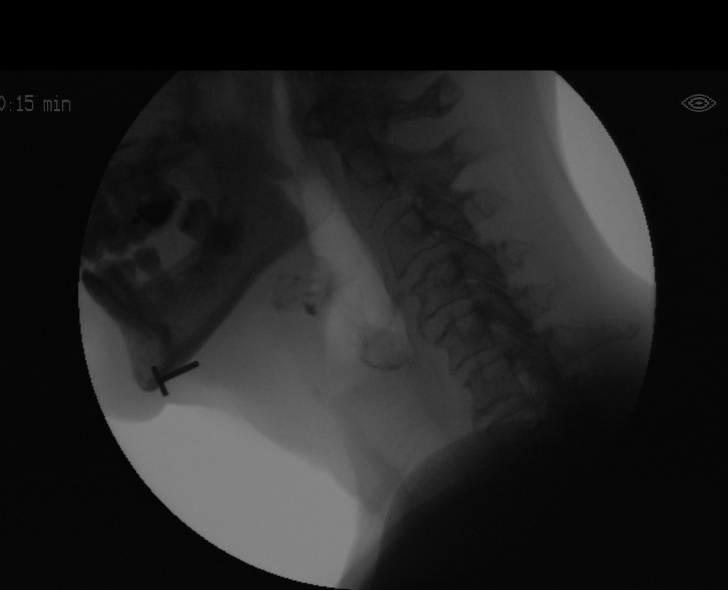

[Series 10: run · 1 of 99 frames shown (5 of 12)]
[frame 15/99]
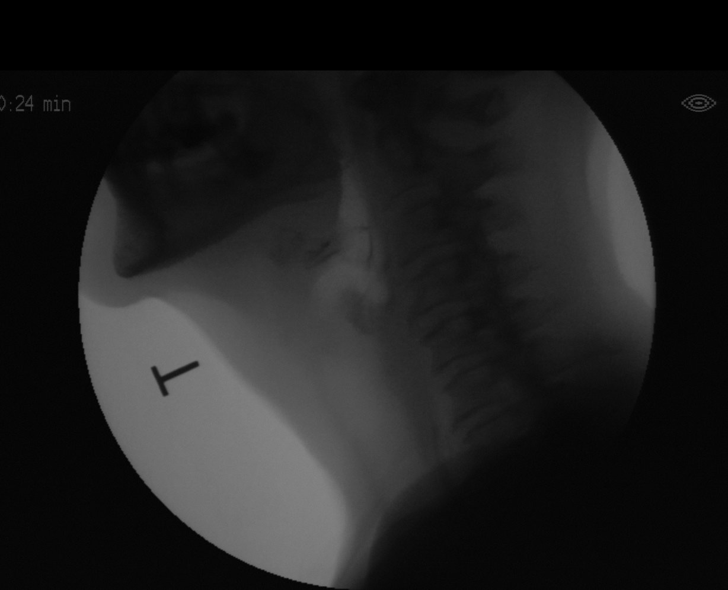

[Series 12: run · 1 of 51 frames shown (6 of 12)]
[frame 26/51]
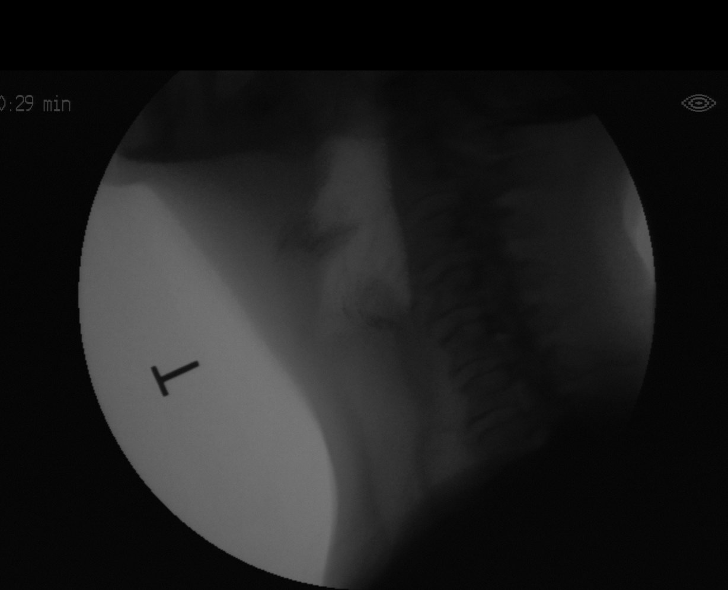

[Series 14: run · 1 of 200 frames shown (7 of 12)]
[frame 31/200]
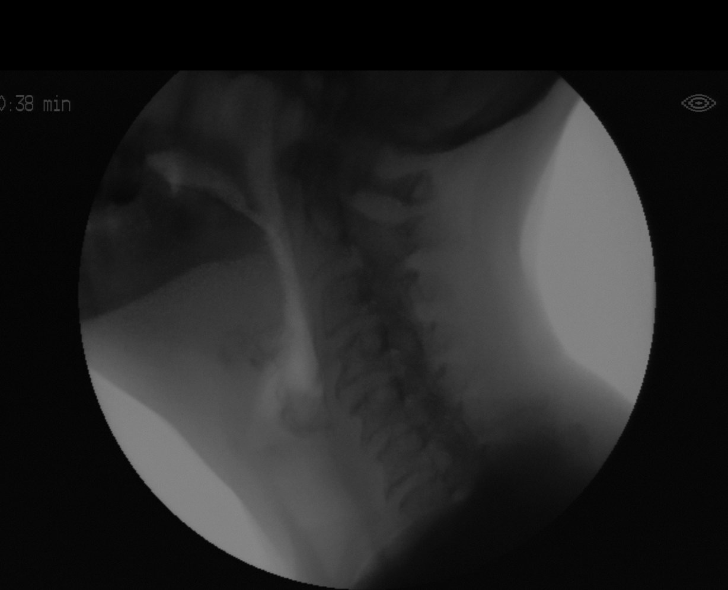

[Series 16: run · 1 of 17 frames shown (8 of 12)]
[frame 15/17]
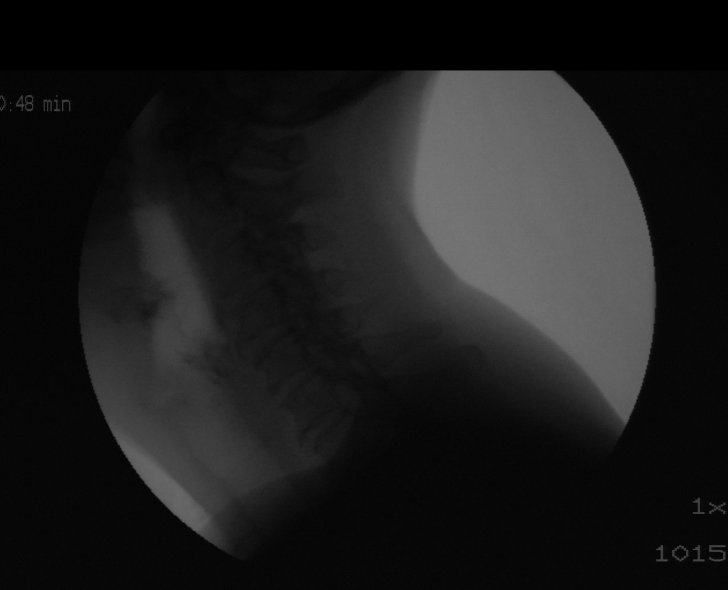

[Series 18: run · 1 of 197 frames shown (9 of 12)]
[frame 168/197]
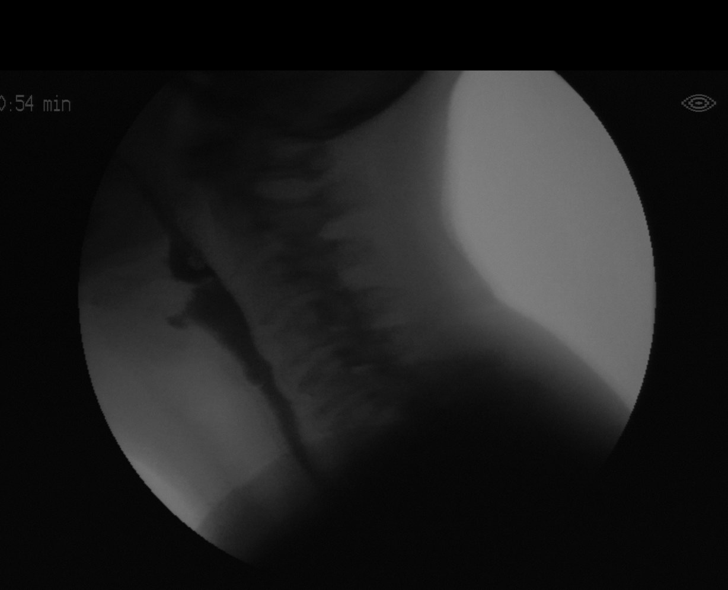

[Series 20: run · 1 of 309 frames shown (10 of 12)]
[frame 263/309]
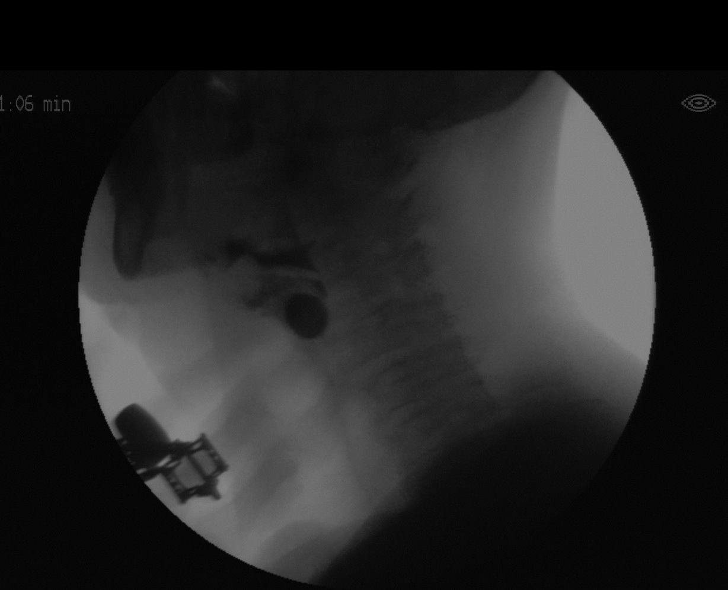

[Series 22: run · 1 of 76 frames shown (11 of 12)]
[frame 65/76]
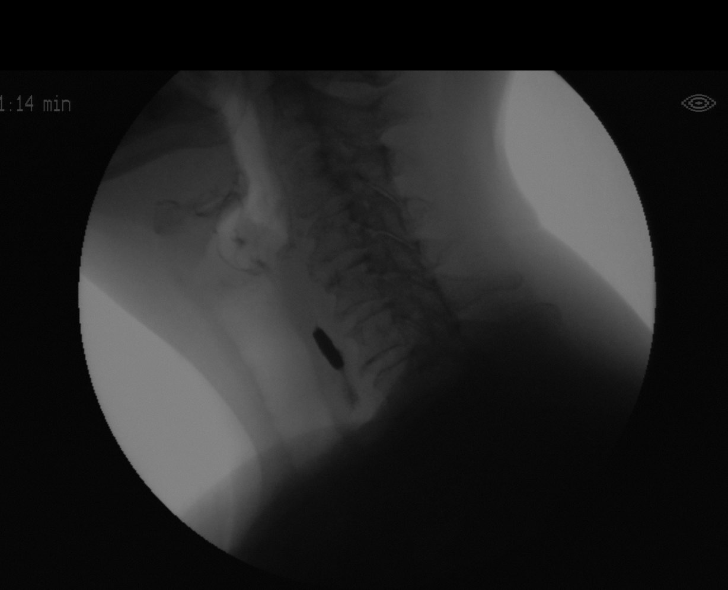

[Series 24: run · 1 of 36 frames shown (12 of 12)]
[frame 31/36]
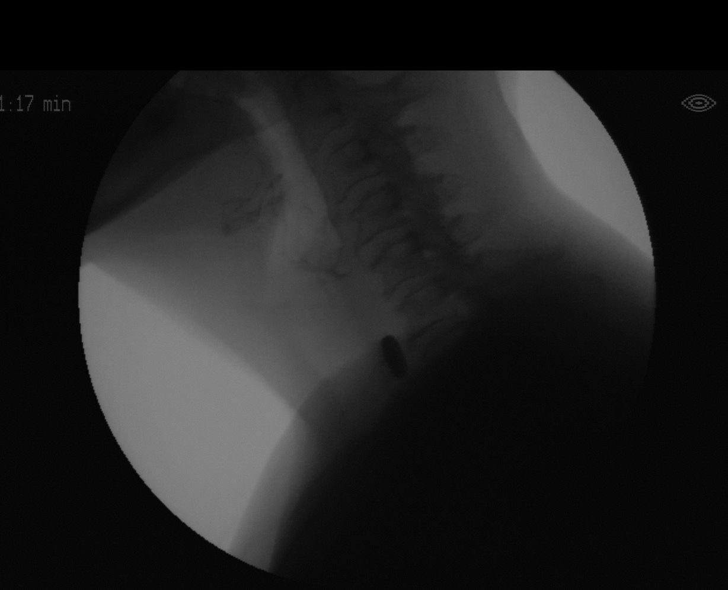

[12 of 24 positions shown; findings below may reference images not displayed]

FINDINGS: Vestibular  Penetration:  None seen.

Aspiration:  None seen.

Other: Barium pill held up within the distal esophagus past with
several subsequent swallows of puree and thin liquid.
IMPRESSION: No aspiration.

Please refer to the Speech Pathologists report for complete details
and recommendations.

## 2023-10-30 ENCOUNTER — Encounter: Payer: Self-pay | Admitting: Family Medicine

## 2023-11-02 ENCOUNTER — Other Ambulatory Visit: Payer: Self-pay | Admitting: Gastroenterology

## 2023-11-02 ENCOUNTER — Other Ambulatory Visit: Payer: Self-pay | Admitting: Cardiovascular Disease

## 2023-11-02 ENCOUNTER — Other Ambulatory Visit: Payer: Self-pay | Admitting: Family Medicine

## 2023-11-02 DIAGNOSIS — G63 Polyneuropathy in diseases classified elsewhere: Secondary | ICD-10-CM

## 2023-11-02 DIAGNOSIS — J309 Allergic rhinitis, unspecified: Secondary | ICD-10-CM

## 2023-11-02 DIAGNOSIS — K2970 Gastritis, unspecified, without bleeding: Secondary | ICD-10-CM

## 2023-11-02 DIAGNOSIS — R112 Nausea with vomiting, unspecified: Secondary | ICD-10-CM

## 2023-12-06 ENCOUNTER — Ambulatory Visit: Payer: Self-pay | Admitting: Family Medicine

## 2023-12-06 ENCOUNTER — Other Ambulatory Visit: Payer: Self-pay | Admitting: Family Medicine

## 2023-12-06 DIAGNOSIS — G2581 Restless legs syndrome: Secondary | ICD-10-CM

## 2023-12-06 DIAGNOSIS — G63 Polyneuropathy in diseases classified elsewhere: Secondary | ICD-10-CM

## 2023-12-06 NOTE — Telephone Encounter (Signed)
 Copied from CRM [id number]. Topic: Clinical - Red Word Triage >> Dec 06, 2023  1:04 PM [name] wrote: Red Word that prompted transfer to Nurse Triage: Fall   Chief Complaint: Patient requesting AWV; knee pain from fall 2 weeks ago Symptoms: pain, controlled with rest and tylenol  Frequency: n/a Pertinent Negatives: Patient denies weakness  Disposition: [] ED /[] Urgent Care (no appt availability in office) / [] Appointment(In office/virtual)/ []  [facility]/ [] Home Care/ [] Refused Recommended Disposition /[] [facility]/ [x]  Follow-up with PCP Additional Notes: Patient requesting AWV. Stated during call that she had a fall 2 weeks ago and is having some knee pain. States "That's not what I want to be seen for though" RN went ahead and triaged, but scheduled AWV for 02/26/24 per patient request.    Reason for Disposition  [1] Recent fall AND [2] no injury  Answer Assessment - Initial Assessment Questions 1. MECHANISM: "How did the fall happen?"     Tripped over pet cat on concrete  2. DOMESTIC VIOLENCE AND ELDER ABUSE SCREENING: "Did you fall because someone pushed you or tried to hurt you?" If Yes, ask: "Are you safe now?"     SAFE  3. ONSET: "When did the fall happen?" (e.g., minutes, hours, or days ago)     Erin [name] 2 weeks  4. LOCATION: "What part of the body hit the ground?" (e.g., back, buttocks, head, hips, knees, hands, head, stomach)     Fell on concrete;   5. INJURY: "Did you hurt (injure) yourself when you fell?" If Yes, ask: "What did you injure? Tell me more about this?" (e.g., body area; type of injury; pain severity)"  Landed on both knees; Outside of Left knee causing pain  6. PAIN: "Is there any pain?" If Yes, ask: "How bad is the pain?" (e.g., Scale 1-10; or mild,  moderate, severe)   - NONE (0): No pain   - MILD (1-3): Doesn't interfere with normal activities    - MODERATE (4-7): Interferes with normal activities or awakens from sleep    - SEVERE  (8-10): Excruciating pain, unable to do any normal activities      Moderate pain  7. SIZE: For cuts, bruises, or swelling, ask: "How large is it?" (e.g., inches or centimeters)      Swelling on left side of knee but going down.   8. PREGNANCY: "Is there any chance you are pregnant?" "When was your last menstrual period?"     No  9. OTHER SYMPTOMS: "Do you have any other symptoms?" (e.g., dizziness, fever, weakness; new onset or worsening).      No other symptoms  10. CAUSE: "What do you think caused the fall (or falling)?" (e.g., tripped, dizzy spell)       Tripped  Protocols used: Falls and [facility]

## 2023-12-07 NOTE — Telephone Encounter (Signed)
Noted- ok to close.

## 2023-12-22 ENCOUNTER — Other Ambulatory Visit: Payer: Self-pay | Admitting: Vascular Surgery

## 2024-01-14 ENCOUNTER — Inpatient Hospital Stay (HOSPITAL_COMMUNITY)
Admission: EM | Admit: 2024-01-14 | Discharge: 2024-01-17 | DRG: 683 | Disposition: A | Discharge status: Home / Self Care | Payer: Medicare Other | Attending: Internal Medicine | Admitting: Internal Medicine

## 2024-01-14 ENCOUNTER — Encounter (HOSPITAL_COMMUNITY): Payer: Self-pay | Admitting: Internal Medicine

## 2024-01-14 ENCOUNTER — Other Ambulatory Visit: Payer: Self-pay

## 2024-01-14 DIAGNOSIS — Z1152 Encounter for screening for COVID-19: Secondary | ICD-10-CM | POA: Diagnosis not present

## 2024-01-14 DIAGNOSIS — N179 Acute kidney failure, unspecified: Principal | ICD-10-CM | POA: Diagnosis present

## 2024-01-14 DIAGNOSIS — D649 Anemia, unspecified: Secondary | ICD-10-CM | POA: Diagnosis not present

## 2024-01-14 DIAGNOSIS — Z888 Allergy status to other drugs, medicaments and biological substances status: Secondary | ICD-10-CM

## 2024-01-14 DIAGNOSIS — Z79890 Hormone replacement therapy (postmenopausal): Secondary | ICD-10-CM

## 2024-01-14 DIAGNOSIS — Z885 Allergy status to narcotic agent status: Secondary | ICD-10-CM

## 2024-01-14 DIAGNOSIS — G63 Polyneuropathy in diseases classified elsewhere: Secondary | ICD-10-CM | POA: Diagnosis present

## 2024-01-14 DIAGNOSIS — I10 Essential (primary) hypertension: Secondary | ICD-10-CM | POA: Diagnosis not present

## 2024-01-14 DIAGNOSIS — G2581 Restless legs syndrome: Secondary | ICD-10-CM | POA: Diagnosis present

## 2024-01-14 DIAGNOSIS — Z860101 Personal history of adenomatous and serrated colon polyps: Secondary | ICD-10-CM

## 2024-01-14 DIAGNOSIS — F32A Depression, unspecified: Secondary | ICD-10-CM | POA: Diagnosis present

## 2024-01-14 DIAGNOSIS — Z79899 Other long term (current) drug therapy: Secondary | ICD-10-CM

## 2024-01-14 DIAGNOSIS — F419 Anxiety disorder, unspecified: Secondary | ICD-10-CM | POA: Diagnosis present

## 2024-01-14 DIAGNOSIS — I7409 Other arterial embolism and thrombosis of abdominal aorta: Secondary | ICD-10-CM | POA: Diagnosis present

## 2024-01-14 DIAGNOSIS — E039 Hypothyroidism, unspecified: Secondary | ICD-10-CM | POA: Diagnosis not present

## 2024-01-14 DIAGNOSIS — E1140 Type 2 diabetes mellitus with diabetic neuropathy, unspecified: Secondary | ICD-10-CM | POA: Diagnosis present

## 2024-01-14 DIAGNOSIS — Z923 Personal history of irradiation: Secondary | ICD-10-CM

## 2024-01-14 DIAGNOSIS — E1169 Type 2 diabetes mellitus with other specified complication: Secondary | ICD-10-CM | POA: Diagnosis present

## 2024-01-14 DIAGNOSIS — E860 Dehydration: Secondary | ICD-10-CM | POA: Diagnosis not present

## 2024-01-14 DIAGNOSIS — E785 Hyperlipidemia, unspecified: Secondary | ICD-10-CM | POA: Diagnosis not present

## 2024-01-14 DIAGNOSIS — K219 Gastro-esophageal reflux disease without esophagitis: Secondary | ICD-10-CM | POA: Diagnosis present

## 2024-01-14 DIAGNOSIS — I2510 Atherosclerotic heart disease of native coronary artery without angina pectoris: Secondary | ICD-10-CM | POA: Diagnosis present

## 2024-01-14 DIAGNOSIS — Z7982 Long term (current) use of aspirin: Secondary | ICD-10-CM | POA: Diagnosis not present

## 2024-01-14 DIAGNOSIS — K589 Irritable bowel syndrome without diarrhea: Secondary | ICD-10-CM | POA: Diagnosis not present

## 2024-01-14 DIAGNOSIS — Z7902 Long term (current) use of antithrombotics/antiplatelets: Secondary | ICD-10-CM | POA: Diagnosis not present

## 2024-01-14 DIAGNOSIS — Z955 Presence of coronary angioplasty implant and graft: Secondary | ICD-10-CM

## 2024-01-14 DIAGNOSIS — Z85048 Personal history of other malignant neoplasm of rectum, rectosigmoid junction, and anus: Secondary | ICD-10-CM

## 2024-01-14 DIAGNOSIS — E871 Hypo-osmolality and hyponatremia: Secondary | ICD-10-CM | POA: Diagnosis present

## 2024-01-14 DIAGNOSIS — E876 Hypokalemia: Secondary | ICD-10-CM | POA: Diagnosis present

## 2024-01-14 DIAGNOSIS — K551 Chronic vascular disorders of intestine: Secondary | ICD-10-CM | POA: Diagnosis not present

## 2024-01-14 DIAGNOSIS — I739 Peripheral vascular disease, unspecified: Secondary | ICD-10-CM | POA: Diagnosis present

## 2024-01-14 DIAGNOSIS — H8110 Benign paroxysmal vertigo, unspecified ear: Secondary | ICD-10-CM | POA: Diagnosis present

## 2024-01-14 DIAGNOSIS — Z8249 Family history of ischemic heart disease and other diseases of the circulatory system: Secondary | ICD-10-CM

## 2024-01-14 DIAGNOSIS — Z882 Allergy status to sulfonamides status: Secondary | ICD-10-CM

## 2024-01-14 DIAGNOSIS — Z634 Disappearance and death of family member: Secondary | ICD-10-CM

## 2024-01-14 DIAGNOSIS — R519 Headache, unspecified: Secondary | ICD-10-CM | POA: Diagnosis present

## 2024-01-14 DIAGNOSIS — Z87891 Personal history of nicotine dependence: Secondary | ICD-10-CM

## 2024-01-14 LAB — COMPREHENSIVE METABOLIC PANEL
ALT: 13 U/L (ref 0–44)
ALT: 11 U/L (ref 0–44)
AST: 16 U/L (ref 15–41)
AST: 12 U/L — ABNORMAL LOW (ref 15–41)
Albumin: 3.2 g/dL — ABNORMAL LOW (ref 3.5–5.0)
Albumin: 2.7 g/dL — ABNORMAL LOW (ref 3.5–5.0)
Alkaline Phosphatase: 124 U/L (ref 38–126)
Alkaline Phosphatase: 102 U/L (ref 38–126)
Anion gap: 14 (ref 5–15)
Anion gap: 9 (ref 5–15)
BUN: 32 mg/dL — ABNORMAL HIGH (ref 8–23)
BUN: 31 mg/dL — ABNORMAL HIGH (ref 8–23)
CO2: 22 mmol/L (ref 22–32)
CO2: 21 mmol/L — ABNORMAL LOW (ref 22–32)
Calcium: 9.1 mg/dL (ref 8.9–10.3)
Calcium: 7.9 mg/dL — ABNORMAL LOW (ref 8.9–10.3)
Chloride: 98 mmol/L (ref 98–111)
Chloride: 105 mmol/L (ref 98–111)
Creatinine, Ser: 1.88 mg/dL — ABNORMAL HIGH (ref 0.44–1.00)
Creatinine, Ser: 1.79 mg/dL — ABNORMAL HIGH (ref 0.44–1.00)
GFR, Estimated: 29 mL/min — ABNORMAL LOW (ref >60)
GFR, Estimated: 31 mL/min — ABNORMAL LOW (ref >60)
Glucose, Bld: 119 mg/dL — ABNORMAL HIGH (ref 70–99)
Glucose, Bld: 100 mg/dL — ABNORMAL HIGH (ref 70–99)
Potassium: 2 mmol/L — CL (ref 3.5–5.1)
Potassium: 2.2 mmol/L — CL (ref 3.5–5.1)
Sodium: 134 mmol/L — ABNORMAL LOW (ref 135–145)
Sodium: 135 mmol/L (ref 135–145)
Total Bilirubin: 0.6 mg/dL (ref 0.0–1.2)
Total Bilirubin: 0.6 mg/dL (ref 0.0–1.2)
Total Protein: 7.3 g/dL (ref 6.5–8.1)
Total Protein: 6.1 g/dL — ABNORMAL LOW (ref 6.5–8.1)

## 2024-01-14 LAB — CBC WITH DIFFERENTIAL/PLATELET
Abs Immature Granulocytes: 0.24 10*3/uL — ABNORMAL HIGH (ref 0.00–0.07)
Basophils Absolute: 0 10*3/uL (ref 0.0–0.1)
Basophils Relative: 0 %
Eosinophils Absolute: 0 10*3/uL (ref 0.0–0.5)
Eosinophils Relative: 0 %
HCT: 34.4 % — ABNORMAL LOW (ref 36.0–46.0)
Hemoglobin: 11.3 g/dL — ABNORMAL LOW (ref 12.0–15.0)
Immature Granulocytes: 2 %
Lymphocytes Relative: 6 %
Lymphs Abs: 0.7 10*3/uL (ref 0.7–4.0)
MCH: 30.9 pg (ref 26.0–34.0)
MCHC: 32.8 g/dL (ref 30.0–36.0)
MCV: 94 fL (ref 80.0–100.0)
Monocytes Absolute: 1 10*3/uL (ref 0.1–1.0)
Monocytes Relative: 9 %
Neutro Abs: 8.5 10*3/uL — ABNORMAL HIGH (ref 1.7–7.7)
Neutrophils Relative %: 83 %
Platelets: 350 10*3/uL (ref 150–400)
RBC: 3.66 MIL/uL — ABNORMAL LOW (ref 3.87–5.11)
RDW: 13.5 % (ref 11.5–15.5)
WBC: 10.5 10*3/uL (ref 4.0–10.5)
nRBC: 0 % (ref 0.0–0.2)

## 2024-01-14 LAB — MRSA NEXT GEN BY PCR, NASAL: MRSA by PCR Next Gen: NOT DETECTED

## 2024-01-14 LAB — URINALYSIS, ROUTINE W REFLEX MICROSCOPIC
Bacteria, UA: NONE SEEN
Bilirubin Urine: NEGATIVE
Glucose, UA: NEGATIVE mg/dL
Ketones, ur: NEGATIVE mg/dL
Nitrite: NEGATIVE
Protein, ur: 30 mg/dL — AB
Specific Gravity, Urine: 1.01 (ref 1.005–1.030)
pH: 6 (ref 5.0–8.0)

## 2024-01-14 LAB — RESP PANEL BY RT-PCR (RSV, FLU A&B, COVID)  RVPGX2
Influenza A by PCR: NEGATIVE
Influenza B by PCR: NEGATIVE
Resp Syncytial Virus by PCR: NEGATIVE
SARS Coronavirus 2 by RT PCR: NEGATIVE

## 2024-01-14 LAB — TSH: TSH: 9.172 u[IU]/mL — ABNORMAL HIGH (ref 0.350–4.500)

## 2024-01-14 LAB — MAGNESIUM: Magnesium: 1.7 mg/dL (ref 1.7–2.4)

## 2024-01-14 LAB — LIPASE, BLOOD: Lipase: 46 U/L (ref 11–51)

## 2024-01-14 MED ORDER — METOPROLOL SUCCINATE ER 25 MG PO TB24
25.0000 mg | ORAL_TABLET | Freq: Two times a day (BID) | ORAL | Status: DC
  Administered 2024-01-14 – 2024-01-17 (×7): 25 mg via ORAL
  Filled 2024-01-14 (×7): qty 1

## 2024-01-14 MED ORDER — ONDANSETRON HCL 4 MG PO TABS
4.0000 mg | ORAL_TABLET | Freq: Four times a day (QID) | ORAL | Status: DC | PRN
  Administered 2024-01-14 – 2024-01-15 (×2): 4 mg via ORAL
  Filled 2024-01-14 (×3): qty 1

## 2024-01-14 MED ORDER — ONDANSETRON HCL 4 MG/2ML IJ SOLN
4.0000 mg | Freq: Four times a day (QID) | INTRAMUSCULAR | Status: DC | PRN
  Administered 2024-01-15: 4 mg via INTRAVENOUS
  Filled 2024-01-14: qty 2

## 2024-01-14 MED ORDER — ACETAMINOPHEN 650 MG RE SUPP: 650.0000 mg | Freq: Four times a day (QID) | RECTAL | Status: DC | PRN

## 2024-01-14 MED ORDER — MAGNESIUM SULFATE 2 GM/50ML IV SOLN
2.0000 g | Freq: Once | INTRAVENOUS | Status: AC
  Administered 2024-01-14: 2 g via INTRAVENOUS
  Filled 2024-01-14: qty 50

## 2024-01-14 MED ORDER — ACETAMINOPHEN 325 MG PO TABS: 650.0000 mg | ORAL_TABLET | Freq: Four times a day (QID) | ORAL | Status: DC | PRN

## 2024-01-14 MED ORDER — AMLODIPINE BESYLATE 5 MG PO TABS
5.0000 mg | ORAL_TABLET | Freq: Every day | ORAL | Status: DC
  Administered 2024-01-14 – 2024-01-17 (×4): 5 mg via ORAL
  Filled 2024-01-14 (×4): qty 1

## 2024-01-14 MED ORDER — CHLORHEXIDINE GLUCONATE CLOTH 2 % EX PADS
6.0000 | MEDICATED_PAD | Freq: Every day | CUTANEOUS | Status: DC
  Administered 2024-01-14 – 2024-01-17 (×4): 6 via TOPICAL

## 2024-01-14 MED ORDER — ROPINIROLE HCL 0.25 MG PO TABS
0.5000 mg | ORAL_TABLET | Freq: Every day | ORAL | Status: DC
  Administered 2024-01-14 – 2024-01-16 (×3): 0.5 mg via ORAL
  Filled 2024-01-14 (×3): qty 2

## 2024-01-14 MED ORDER — POTASSIUM CHLORIDE 10 MEQ/100ML IV SOLN
10.0000 meq | INTRAVENOUS | Status: AC
  Administered 2024-01-14 (×3): 10 meq via INTRAVENOUS
  Filled 2024-01-14 (×3): qty 100

## 2024-01-14 MED ORDER — PANTOPRAZOLE SODIUM 40 MG PO TBEC
40.0000 mg | DELAYED_RELEASE_TABLET | Freq: Two times a day (BID) | ORAL | Status: DC
  Administered 2024-01-14 – 2024-01-17 (×7): 40 mg via ORAL
  Filled 2024-01-14 (×7): qty 1

## 2024-01-14 MED ORDER — ONDANSETRON HCL 4 MG/2ML IJ SOLN
4.0000 mg | Freq: Once | INTRAMUSCULAR | Status: AC
  Administered 2024-01-14: 4 mg via INTRAVENOUS
  Filled 2024-01-14: qty 2

## 2024-01-14 MED ORDER — POTASSIUM CHLORIDE CRYS ER 20 MEQ PO TBCR
40.0000 meq | EXTENDED_RELEASE_TABLET | Freq: Two times a day (BID) | ORAL | Status: AC
  Administered 2024-01-14 (×2): 40 meq via ORAL
  Filled 2024-01-14 (×2): qty 2

## 2024-01-14 MED ORDER — SODIUM CHLORIDE 0.9 % IV SOLN
Freq: Once | INTRAVENOUS | Status: AC
  Administered 2024-01-14: 1000 mL via INTRAVENOUS

## 2024-01-14 MED ORDER — LOPERAMIDE HCL 2 MG PO CAPS: 2.0000 mg | ORAL_CAPSULE | ORAL | Status: DC | PRN

## 2024-01-14 MED ORDER — MECLIZINE HCL 12.5 MG PO TABS: 25.0000 mg | ORAL_TABLET | Freq: Three times a day (TID) | ORAL | Status: DC | PRN

## 2024-01-14 MED ORDER — SODIUM CHLORIDE 0.9 % IV SOLN: INTRAVENOUS | Status: AC

## 2024-01-14 MED ORDER — ROSUVASTATIN CALCIUM 20 MG PO TABS
40.0000 mg | ORAL_TABLET | Freq: Every day | ORAL | Status: DC
  Administered 2024-01-14 – 2024-01-17 (×4): 40 mg via ORAL
  Filled 2024-01-14 (×4): qty 2

## 2024-01-14 MED ORDER — LEVOTHYROXINE SODIUM 75 MCG PO TABS
75.0000 ug | ORAL_TABLET | Freq: Every day | ORAL | Status: DC
  Administered 2024-01-15 – 2024-01-17 (×3): 75 ug via ORAL
  Filled 2024-01-14 (×3): qty 1

## 2024-01-14 MED ORDER — ACETAMINOPHEN 325 MG PO TABS
650.0000 mg | ORAL_TABLET | Freq: Four times a day (QID) | ORAL | Status: DC | PRN
  Administered 2024-01-14 – 2024-01-17 (×5): 650 mg via ORAL
  Filled 2024-01-14 (×6): qty 2

## 2024-01-14 MED ORDER — DULOXETINE HCL 20 MG PO CPEP
20.0000 mg | ORAL_CAPSULE | Freq: Every day | ORAL | Status: DC
  Administered 2024-01-15 – 2024-01-17 (×3): 20 mg via ORAL
  Filled 2024-01-14 (×6): qty 1

## 2024-01-14 MED ORDER — FLUTICASONE PROPIONATE 50 MCG/ACT NA SUSP
1.0000 | Freq: Every day | NASAL | Status: DC
  Administered 2024-01-14 – 2024-01-17 (×4): 1 via NASAL
  Filled 2024-01-14: qty 16

## 2024-01-14 MED ORDER — SODIUM CHLORIDE 0.9 % IV BOLUS
1000.0000 mL | Freq: Once | INTRAVENOUS | Status: AC
  Administered 2024-01-14: 1000 mL via INTRAVENOUS

## 2024-01-14 MED ORDER — HEPARIN SODIUM (PORCINE) 5000 UNIT/ML IJ SOLN
5000.0000 [IU] | Freq: Three times a day (TID) | INTRAMUSCULAR | Status: DC
  Administered 2024-01-14 – 2024-01-17 (×8): 5000 [IU] via SUBCUTANEOUS
  Filled 2024-01-14 (×8): qty 1

## 2024-01-14 MED ORDER — GABAPENTIN 300 MG PO CAPS
600.0000 mg | ORAL_CAPSULE | Freq: Two times a day (BID) | ORAL | Status: DC
  Administered 2024-01-14 – 2024-01-17 (×7): 600 mg via ORAL
  Filled 2024-01-14 (×7): qty 2

## 2024-01-14 MED ORDER — ASPIRIN 81 MG PO TBEC
81.0000 mg | DELAYED_RELEASE_TABLET | Freq: Every day | ORAL | Status: DC
  Administered 2024-01-14 – 2024-01-17 (×4): 81 mg via ORAL
  Filled 2024-01-14 (×4): qty 1

## 2024-01-14 MED ORDER — VITAMIN B-12 1000 MCG PO TABS
1000.0000 ug | ORAL_TABLET | Freq: Every day | ORAL | Status: DC
  Administered 2024-01-14 – 2024-01-17 (×4): 1000 ug via ORAL
  Filled 2024-01-14 (×4): qty 1

## 2024-01-14 MED ORDER — DICYCLOMINE HCL 10 MG PO CAPS
10.0000 mg | ORAL_CAPSULE | Freq: Three times a day (TID) | ORAL | Status: DC
  Administered 2024-01-14 – 2024-01-17 (×9): 10 mg via ORAL
  Filled 2024-01-14 (×9): qty 1

## 2024-01-14 MED ORDER — CLOPIDOGREL BISULFATE 75 MG PO TABS
75.0000 mg | ORAL_TABLET | Freq: Every day | ORAL | Status: DC
  Administered 2024-01-14 – 2024-01-17 (×4): 75 mg via ORAL
  Filled 2024-01-14 (×4): qty 1

## 2024-01-14 NOTE — ED Provider Notes (Signed)
  Physical Exam  BP (!) 151/69   Pulse 81   Temp 98 F (36.7 C)   Resp 14   SpO2 100%   Physical Exam  Procedures  Procedures  ED Course / MDM    Medical Decision Making Amount and/or Complexity of Data Reviewed Labs: ordered.  Risk Prescription drug management. Decision regarding hospitalization.   Received in signout.  Nausea vomiting diarrhea.  He does have an acute kidney injury along with hypokalemia.  Generally weak.  I think will require admission to the hospital.       Benjiman Core, MD 01/14/24 [phone]

## 2024-01-14 NOTE — ED Triage Notes (Signed)
 Pt c/o weakness, body aches, cough, chills, and N/V x1 week.

## 2024-01-14 NOTE — ED Provider Notes (Signed)
 [facility] [facility] Provider Note   CSN: [id number] Arrival date & time: 01/14/24  0500     History  Chief Complaint  Patient presents with   Weakness    Erin [name] is a 68 y.o. female.  HPI     This is a 68 year old female who presents with nausea, vomiting, body aches, chills.  Onset of symptoms last Feb 27, 2024.  Reports that her father died on 2024-02-26 night.  On 2024-02-27 she began to develop generalized body aches and chills.  Has had nausea and vomiting as well as diarrhea.  No real cough or fevers.  Reports decreased p.o. intake and generalized weakness.  She states she is so weak that she cannot brush her hair.  Also reports a headache.  No known sick contacts.  Home Medications Prior to Admission medications   Medication Sig Start Date End Date Taking? Authorizing Provider  acetaminophen (TYLENOL) 500 MG tablet Take 1,000 mg by mouth daily as needed for headache (pain).     [provider]  acyclovir ointment (ZOVIRAX) 5 % Apply 1 Application topically every 3 (three) hours as needed (fever blisters). 10/24/22   Karie Georges, MD  amLODipine (NORVASC) 5 MG tablet Take 1 tablet (5 mg total) by mouth daily. 10/02/23   Tereso Newcomer T, PA-C  aspirin EC 81 MG tablet Take 81 mg by mouth daily.    [provider]  Cholecalciferol (VITAMIN D) 2000 units tablet Take 2,000 Units by mouth daily.    [provider]  clopidogrel (PLAVIX) 75 MG tablet TAKE 1 TABLET BY MOUTH EVERY DAY 12/25/23   Cephus Shelling, MD  dicyclomine (BENTYL) 10 MG capsule TAKE 1 CAPSULE BY MOUTH THREE TIMES A DAY BEFORE MEALS 07/31/23   Meryl Dare, MD  DULoxetine (CYMBALTA) 20 MG capsule TAKE 2-3 CAPSULES BY MOUTH EVERY DAY 06/26/23   Karie Georges, MD  fluticasone [facility]) 50 MCG/ACT nasal spray INSTILL 1 SPRAY INTO BOTH NOSTRILS DAILY 11/02/23   Karie Georges, MD  gabapentin (NEURONTIN) 600 MG tablet TAKE 1 TABLET BY MOUTH  THREE TIMES A DAY 12/06/23   Karie Georges, MD  KLOR-CON M20 20 MEQ tablet TAKE 1 TABLET BY MOUTH DAILY AS NEEDED (FOR FLUID OR EDEMA). 09/19/23   Karie Georges, MD  levothyroxine (SYNTHROID) 75 MCG tablet Take 1 tablet (75 mcg total) by mouth daily before breakfast. TAKE 1 TABLETBY MOUTH DAILY BEFORE BREAKFAST MONDAY-FRIDAY, THEN 1/2 SATURDAY AND SUNDAY 10/04/23   Tereso Newcomer T, PA-C  meclizine (ANTIVERT) 25 MG tablet TAKE 1 TABLET BY MOUTH THREE TIMES A DAY AS NEEDED FOR DIZZINESS 09/19/23   Karie Georges, MD  metoprolol succinate (TOPROL-XL) 25 MG 24 hr tablet TAKE 1 TABLET BY MOUTH TWICE A DAY 11/02/23   Tonny Bollman, MD  nitroGLYCERIN (NITROSTAT) 0.4 MG SL tablet Place 1 tablet (0.4 mg total) under the tongue every 5 (five) minutes as needed for chest pain. 11/02/17 09/25/23  End, Cristal Deer, MD  nystatin-triamcinolone (MYCOLOG II) cream Apply 1 application topically 2 (two) times daily as needed (dry skin).  03/30/16   [provider]  pantoprazole (PROTONIX) 40 MG tablet TAKE 1 TABLET BY MOUTH TWICE A DAY 09/12/23   Meryl Dare, MD  rOPINIRole (REQUIP) 0.5 MG tablet Take 1 tablet (0.5 mg total) by mouth at bedtime. May Increase to 2 tablet at bedtime after 7 days if needed. 12/06/23   Karie Georges, MD  rosuvastatin (CRESTOR) 40  MG tablet TAKE 1 TABLET BY MOUTH EVERY DAY 03/23/23   Malena Peer D, RPH-CPP  tiZANidine (ZANAFLEX) 4 MG tablet Take 2 mg by mouth as needed for muscle spasms. 12/11/20   [provider]  vitamin B-12 1000 MCG tablet Take 1 tablet (1,000 mcg total) by mouth daily. 09/14/15   Dhungel, Theda Belfast, MD      Allergies    Amitiza [lubiprostone], Nortriptyline, Sulfa antibiotics, Tramadol, Atorvastatin, and Claritin [loratadine]    Review of Systems   Review of Systems  Constitutional:  Positive for chills and fatigue. Negative for fever.  Respiratory:  Negative for shortness of breath.   Cardiovascular:  Negative for chest  pain.  Gastrointestinal:  Positive for nausea and vomiting. Negative for abdominal pain.  All other systems reviewed and are negative.   Physical Exam Updated Vital Signs BP (!) 175/58   Pulse 66   Temp 98.4 F (36.9 C) (Oral)   Resp 19   Ht 1.6 m (5\' 3" )   Wt 46.1 kg   SpO2 97%   BMI 18.00 kg/m  Physical Exam Vitals and nursing note reviewed.  Constitutional:      Appearance: She is well-developed. She is not ill-appearing or toxic-appearing.     Comments: Pale, frail appearing  HENT:     Head: Normocephalic and atraumatic.     Mouth/Throat:     Mouth: Mucous membranes are dry.  Eyes:     Pupils: Pupils are equal, round, and reactive to light.  Cardiovascular:     Rate and Rhythm: Normal rate and regular rhythm.     Heart sounds: Normal heart sounds.  Pulmonary:     Effort: Pulmonary effort is normal. No respiratory distress.     Breath sounds: No wheezing.  Abdominal:     Palpations: Abdomen is soft.     Tenderness: There is abdominal tenderness. There is no guarding or rebound.  Musculoskeletal:     Cervical back: Neck supple.  Skin:    General: Skin is warm and dry.  Neurological:     Mental Status: She is alert and oriented to person, place, and time.  Psychiatric:        Mood and Affect: Mood normal.     ED Results / Procedures / Treatments   Labs (all labs ordered are listed, but only abnormal results are displayed) Labs Reviewed  CBC WITH DIFFERENTIAL/PLATELET - Abnormal; Notable for the following components:      Result Value   RBC 3.66 (*)    Hemoglobin 11.3 (*)    HCT 34.4 (*)    Neutro Abs 8.5 (*)    Abs Immature Granulocytes 0.24 (*)    All other components within normal limits  COMPREHENSIVE METABOLIC PANEL - Abnormal; Notable for the following components:   Sodium 134 (*)    Potassium 2.0 (*)    Glucose, Bld 119 (*)    BUN 32 (*)    Creatinine, Ser 1.88 (*)    Albumin 3.2 (*)    GFR, Estimated 29 (*)    All other components within  normal limits  URINALYSIS, ROUTINE W REFLEX MICROSCOPIC - Abnormal; Notable for the following components:   Hgb urine dipstick SMALL (*)    Protein, ur 30 (*)    Leukocytes,Ua SMALL (*)    All other components within normal limits  COMPREHENSIVE METABOLIC PANEL - Abnormal; Notable for the following components:   Potassium 2.2 (*)    CO2 21 (*)    Glucose, Bld 100 (*)  BUN 31 (*)    Creatinine, Ser 1.79 (*)    Calcium 7.9 (*)    Total Protein 6.1 (*)    Albumin 2.7 (*)    AST 12 (*)    GFR, Estimated 31 (*)    All other components within normal limits  TSH - Abnormal; Notable for the following components:   TSH 9.172 (*)    All other components within normal limits  RESP PANEL BY RT-PCR (RSV, FLU A&B, COVID)  RVPGX2  MRSA NEXT GEN BY PCR, NASAL  GASTROINTESTINAL PANEL BY PCR, STOOL (REPLACES STOOL CULTURE)  LIPASE, BLOOD  MAGNESIUM  HIV ANTIBODY (ROUTINE TESTING W REFLEX)  MAGNESIUM  COMPREHENSIVE METABOLIC PANEL  CBC    EKG None  Radiology No results found.  Procedures Procedures    Medications Ordered in ED Medications  Chlorhexidine Gluconate Cloth 2 % PADS 6 each (6 each Topical Given 01/14/24 0811)  aspirin EC tablet 81 mg (81 mg Oral Given 01/14/24 1102)  amLODipine (NORVASC) tablet 5 mg (5 mg Oral Given 01/14/24 1103)  metoprolol succinate (TOPROL-XL) 24 hr tablet 25 mg (25 mg Oral Given 01/14/24 2132)  rosuvastatin (CRESTOR) tablet 40 mg (40 mg Oral Given 01/14/24 1103)  DULoxetine (CYMBALTA) DR capsule 20 mg (20 mg Oral Not Given 01/14/24 1204)  levothyroxine (SYNTHROID) tablet 75 mcg (has no administration in time range)  dicyclomine (BENTYL) capsule 10 mg (10 mg Oral Given 01/14/24 1656)  meclizine (ANTIVERT) tablet 25 mg (has no administration in time range)  pantoprazole (PROTONIX) EC tablet 40 mg (40 mg Oral Given 01/14/24 2132)  clopidogrel (PLAVIX) tablet 75 mg (75 mg Oral Given 01/14/24 1103)  cyanocobalamin (VITAMIN B12) tablet 1,000 mcg (1,000 mcg  Oral Given 01/14/24 1102)  gabapentin (NEURONTIN) capsule 600 mg (600 mg Oral Given 01/14/24 2132)  rOPINIRole (REQUIP) tablet 0.5 mg (0.5 mg Oral Given 01/14/24 2132)  fluticasone (FLONASE) 50 MCG/ACT nasal spray 1 spray (1 spray Each Nare Given 01/14/24 1103)  heparin injection 5,000 Units (5,000 Units Subcutaneous Given 01/14/24 2131)  acetaminophen (TYLENOL) tablet 650 mg (650 mg Oral Given 01/14/24 2131)    Or  acetaminophen (TYLENOL) suppository 650 mg ( Rectal See Alternative 01/14/24 2131)  ondansetron (ZOFRAN) tablet 4 mg (4 mg Oral Given 01/14/24 1110)    Or  ondansetron (ZOFRAN) injection 4 mg ( Intravenous See Alternative 01/14/24 1110)  loperamide (IMODIUM) capsule 2 mg (has no administration in time range)  0.9 %  sodium chloride infusion ( Intravenous New Bag/Given 01/14/24 1108)  sodium chloride 0.9 % bolus 1,000 mL (0 mLs Intravenous Stopped 01/14/24 0746)  ondansetron (ZOFRAN) injection 4 mg (4 mg Intravenous Given 01/14/24 0559)  potassium chloride 10 mEq in 100 mL IVPB (10 mEq Intravenous New Bag/Given 01/14/24 0936)  0.9 %  sodium chloride infusion (0 mLs Intravenous Stopped 01/14/24 1102)  magnesium sulfate IVPB 2 g 50 mL (0 g Intravenous Stopped 01/14/24 0758)  potassium chloride SA (KLOR-CON M) CR tablet 40 mEq (40 mEq Oral Given 01/14/24 2131)    ED Course/ Medical Decision Making/ A&P                                 Medical Decision Making Amount and/or Complexity of Data Reviewed Labs: ordered.  Risk Prescription drug management. Decision regarding hospitalization.   This patient presents to the ED for concern of nausea, vomiting, diarrhea, this involves an extensive number of treatment options, and is a complaint  that carries with it a high risk of complications and morbidity.  I considered the following differential and admission for this acute, potentially life threatening condition.  The differential diagnosis includes viral illness such as COVID or influenza,  dehydration, metabolic derangement, less likely intra-abdominal pathology  MDM:    This is a 68 year old female who presents with nausea, vomiting, diarrhea and generalized weakness.  She is ill-appearing but nontoxic.  Appears dehydrated.  Patient was given fluids and pain medication.  Labs obtained and reviewed.  Notable for hypokalemia and AKI.  Potassium was replaced IV as well as magnesium.  She was given a liter of fluids.  COVID and influenza are negative however I do highly suspect viral etiology given her other symptoms.  Will need admission for metabolite replacement and hydration.  (Labs, imaging, consults)  Labs: I Ordered, and personally interpreted labs.  The pertinent results include: CBC, CMP, lipase, COVID, influenza  Imaging Studies ordered: I ordered imaging studies including none I independently visualized and interpreted imaging. I agree with the radiologist interpretation  Additional history obtained from chart review.  External records from outside source obtained and reviewed including prior evaluations  Cardiac Monitoring: The patient was maintained on a cardiac monitor.  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/14 60.4Gy/81fx and chemo   Anxiety    Aortoiliac occlusive disease (HCC) 08/14/2017   B12 deficiency anemia 09/14/2015   Blood transfusion without reported diagnosis    BPPV (benign paroxysmal positional vertigo) 07/08/2015   Cardiomyopathy (HCC) 11/03/2017   Chronic back  pain    Chronic daily headache 03/29/2013   takes bc powder   Closed right hip fracture (HCC) 09/10/2015   Coronary artery disease involving native coronary artery of native heart without angina pectoris 10/13/2017   DES to mid RCA   Depression    Essential (hemorrhagic) thrombocythemia (HCC) 09/06/2018   Essential hypertension 09/25/2023   Family history of early CAD 04/08/2016   GERD (gastroesophageal reflux disease)    History of hiatal hernia    Hot flashes    Hyperlipidemia    Hyperlipidemia associated with type 2 diabetes mellitus (HCC) 05/11/2017   Hypothyroidism 09/10/2015   IBS (irritable bowel syndrome) 03/29/2013   Neck pain 07/08/2015   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   takes neurotin   Peripheral vascular disease (HCC) 11/03/2017   QT prolongation    Sacroiliitis (HCC) 09/06/2018   Spasms of the hands or feet 10/08/2017   Syncope 06/07/2016   Tubular adenoma of colon 09/08/2003   Vertigo    Wears glasses      Medicines Meds ordered this encounter  Medications   sodium chloride 0.9 % bolus 1,000 mL   ondansetron (ZOFRAN) injection 4 mg   potassium chloride 10 mEq in 100 mL IVPB   0.9 %  sodium chloride infusion   magnesium sulfate IVPB 2 g 50 mL   Chlorhexidine Gluconate Cloth 2 % PADS 6 each   DISCONTD: acetaminophen (TYLENOL) tablet 650 mg   aspirin EC tablet 81 mg   amLODipine (NORVASC) tablet  5 mg   metoprolol succinate (TOPROL-XL) 24 hr tablet 25 mg   rosuvastatin (CRESTOR) tablet 40 mg   DULoxetine (CYMBALTA) DR capsule 20 mg    TAKE 2-3 CAPSULES BY MOUTH EVERY DAY     levothyroxine (SYNTHROID) tablet 75 mcg    TAKE 1 TABLETBY MOUTH DAILY BEFORE BREAKFAST MONDAY-FRIDAY, THEN 1/2 SATURDAY AND SUNDAY     dicyclomine (BENTYL) capsule 10 mg    TAKE 1 CAPSULE BY MOUTH THREE TIMES A DAY BEFORE MEALS     meclizine (ANTIVERT) tablet 25 mg    TAKE 1 TABLET BY MOUTH THREE TIMES A DAY AS NEEDED FOR DIZZINESS     pantoprazole (PROTONIX) EC tablet 40 mg   clopidogrel  (PLAVIX) tablet 75 mg   cyanocobalamin (VITAMIN B12) tablet 1,000 mcg   gabapentin (NEURONTIN) capsule 600 mg   rOPINIRole (REQUIP) tablet 0.5 mg    May Increase to 2 tablet at bedtime after 7 days if needed.     fluticasone (FLONASE) 50 MCG/ACT nasal spray 1 spray   heparin injection 5,000 Units   OR Linked Order Group    acetaminophen (TYLENOL) tablet 650 mg    acetaminophen (TYLENOL) suppository 650 mg   OR Linked Order Group    ondansetron (ZOFRAN) tablet 4 mg    ondansetron (ZOFRAN) injection 4 mg   loperamide (IMODIUM) capsule 2 mg   potassium chloride SA (KLOR-CON M) CR tablet 40 mEq   0.9 %  sodium chloride infusion    I have reviewed the patients home medicines and have made adjustments as needed  Problem List / ED Course: Problem List Items Addressed This Visit       Genitourinary   * (Principal) AKI (acute kidney injury) (HCC)     Other   Hypokalemia   Other Visit Diagnoses       Dehydration    -  Primary                   Final Clinical Impression(s) / ED Diagnoses Final diagnoses:  Dehydration  Hypokalemia  AKI (acute kidney injury) (HCC)    Rx / DC Orders ED Discharge Orders     None         Shon Baton, MD 01/14/24 2259

## 2024-01-14 NOTE — H&P (Signed)
 History and Physical    Erin [name]:[id number] DOB: [birth year] DOA: 01/14/2024  PCP: Karie Georges, MD   Patient coming from: Home  Chief Complaint: N/V/D  HPI: Erin [name] is a 68 y.o. female with medical history significant for hypertension, dyslipidemia, severe PVD with significant prior interventions, mesenteric ischemia, hypothyroidism, GERD, and anxiety/depression who presented to the ED with 1 week onset of nausea, vomiting, and diarrhea with poor oral intake.  Her father recently died 1 week ago and there was a funeral and she states that she may have been exposed to some sick friends/family members.  She denies any fevers, chills, or significant abdominal pain aside from some cramping.  She states that prior to this she began having some fairly consistent headaches and has been taking Tylenol with some relief.  Her symptoms continue to persist and she overall feels weak and dehydrated and therefore presented to the ED for further evaluation.  She has had trouble with severe hypokalemia in the past after multiple surgical interventions for significant vascular disease.   ED Course: Vital signs stable and patient afebrile.  Hemoglobin 11.3 and potassium of 2 and creatinine 1.88 with sodium 134.  She has been started on IV fluid as well as potassium supplementation.  She is starting to feel somewhat better.  Review of Systems: Reviewed as noted above, otherwise negative.  Past Medical History:  Diagnosis Date   Allergy    Alopecia    Anal cancer (HCC) 08/14/13   invasive squamous cell ca, s/p radiation 10/20-11/26/14 60.4Gy/73fx and chemo   Anxiety    Aortoiliac occlusive disease (HCC) 08/14/2017   B12 deficiency anemia 09/14/2015   Blood transfusion without reported diagnosis    BPPV (benign paroxysmal positional vertigo) 07/08/2015   Cardiomyopathy (HCC) 11/03/2017   Chronic back pain    Chronic daily headache 03/29/2013   takes bc powder   Closed right hip  fracture (HCC) 09/10/2015   Coronary artery disease involving native coronary artery of native heart without angina pectoris 10/13/2017   DES to mid RCA   Depression    Essential (hemorrhagic) thrombocythemia (HCC) 09/06/2018   Essential hypertension 09/25/2023   Family history of early CAD 04/08/2016   GERD (gastroesophageal reflux disease)    History of hiatal hernia    Hot flashes    Hyperlipidemia    Hyperlipidemia associated with type 2 diabetes mellitus (HCC) 05/11/2017   Hypothyroidism 09/10/2015   IBS (irritable bowel syndrome) 03/29/2013   Neck pain 07/08/2015   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   takes neurotin   Peripheral vascular disease (HCC) 11/03/2017   QT prolongation    Sacroiliitis (HCC) 09/06/2018   Spasms of the hands or feet 10/08/2017   Syncope 06/07/2016   Tubular adenoma of colon 09/08/2003   Vertigo    Wears glasses     Past Surgical History:  Procedure Laterality Date   ABDOMINAL AORTOGRAM N/A 08/02/2017   Procedure: ABDOMINAL AORTOGRAM;  Surgeon: Iran Ouch, MD;  Location: [facility];  Service: Cardiovascular;  Laterality: N/A;   AORTA - BILATERAL FEMORAL ARTERY BYPASS GRAFT N/A 08/14/2017   Procedure: AORTA BIFEMORAL BYPASS GRAFT;  Surgeon: Larina Earthly, MD;  Location: [facility] OR;  Service: Vascular;  Laterality: N/A;   COLONOSCOPY     CORONARY STENT INTERVENTION N/A 10/13/2017   Procedure: CORONARY STENT INTERVENTION;  Surgeon: Yvonne Kendall, MD;  Location: [facility];  Service: Cardiovascular;  Laterality: N/A;   dental implant     ECTOPIC PREGNANCY  SURGERY     EMBOLECTOMY N/A 08/14/2017   Procedure: EMBOLECTOMY FEMORAL;  Surgeon: Larina Earthly, MD;  Location: [facility] OR;  Service: Vascular;  Laterality: N/A;   FEMORAL-POPLITEAL BYPASS GRAFT Right 08/14/2017   Procedure: Right Femoral to Above Knee Popliteal Bypass Graft using Non-Reversed Greater Saphenous Vein Graft from Right Leg;  Surgeon: Larina Earthly, MD;  Location: [facility] OR;  Service:  Vascular;  Laterality: Right;   FLEXIBLE SIGMOIDOSCOPY N/A 08/14/2013   Procedure: FLEXIBLE SIGMOIDOSCOPY;  Surgeon: Meryl Dare, MD;  Location: [facility];  Service: Endoscopy;  Laterality: N/A;   LEFT HEART CATH AND CORONARY ANGIOGRAPHY N/A 10/13/2017   Procedure: LEFT HEART CATH AND CORONARY ANGIOGRAPHY;  Surgeon: Yvonne Kendall, MD;  Location: [facility];  Service: Cardiovascular;  Laterality: N/A;   LOWER EXTREMITY ANGIOGRAPHY Bilateral 08/02/2017   Procedure: Lower Extremity Angiography;  Surgeon: Iran Ouch, MD;  Location: [facility];  Service: Cardiovascular;  Laterality: Bilateral;   MULTIPLE TOOTH EXTRACTIONS     PERIPHERAL VASCULAR INTERVENTION  05/22/2018   Procedure: PERIPHERAL VASCULAR INTERVENTION;  Surgeon: Nada Libman, MD;  Location: [facility];  Service: Cardiovascular;;  SMA and Celiac   PILONIDAL CYST EXCISION     POLYPECTOMY     THROMBECTOMY FEMORAL ARTERY Right 08/14/2017   Procedure: THROMBECTOMY FEMORAL ARTERY;  Surgeon: Larina Earthly, MD;  Location: [facility] OR;  Service: Vascular;  Laterality: Right;   TONSILLECTOMY     TOTAL HIP ARTHROPLASTY  09/11/2015   Procedure: TOTAL HIP ARTHROPLASTY;  Surgeon: Sheral Apley, MD;  Location: [facility] OR;  Service: Orthopedics;;   ULTRASOUND GUIDANCE FOR VASCULAR ACCESS  10/13/2017   Procedure: Ultrasound Guidance For Vascular Access;  Surgeon: Yvonne Kendall, MD;  Location: [facility];  Service: Cardiovascular;;   VISCERAL ANGIOGRAPHY N/A 03/03/2020   Procedure: MESTENRIC ANGIOGRAPHY;  Surgeon: Nada Libman, MD;  Location: [facility];  Service: Cardiovascular;  Laterality: N/A;     reports that she quit smoking about 9 years ago. Her smoking use included cigarettes. She has never used smokeless tobacco. She reports that she does not drink alcohol and does not use drugs.  Allergies  Allergen Reactions   Amitiza [Lubiprostone] Diarrhea    Uncontrollable diarrhea    Nortriptyline Other (See Comments)    Stomach distention   Sulfa Antibiotics Nausea And Vomiting and Other (See Comments)    GI distress/pain   Tramadol Itching and Rash   Atorvastatin Nausea And Vomiting   Claritin [Loratadine] Other (See Comments)    Hot flashes    Family History  Problem Relation Age of Onset   Arthritis Mother    Hyperlipidemia Mother    Heart disease Mother    Hypertension Mother    Stroke Mother 38   Irritable bowel syndrome Mother    Thyroid disease Mother    Heart attack Mother    Heart disease Father    Hyperlipidemia Father    Hypertension Father    Stroke Father 33   Thyroid disease Father    Prostate cancer Father    Heart attack Father    Lung cancer Brother 64   Heart attack Maternal Grandfather    Heart attack Maternal Uncle    Colon cancer Neg Hx    Rectal cancer Neg Hx    Stomach cancer Neg Hx    Esophageal cancer Neg Hx     Prior to Admission medications   Medication Sig Start Date End Date  Taking? Authorizing Provider  acetaminophen (TYLENOL) 500 MG tablet Take 1,000 mg by mouth daily as needed for headache (pain).     [provider]  acyclovir ointment (ZOVIRAX) 5 % Apply 1 Application topically every 3 (three) hours as needed (fever blisters). 10/24/22   Karie Georges, MD  amLODipine (NORVASC) 5 MG tablet Take 1 tablet (5 mg total) by mouth daily. 10/02/23   Tereso Newcomer T, PA-C  aspirin EC 81 MG tablet Take 81 mg by mouth daily.    [provider]  Cholecalciferol (VITAMIN D) 2000 units tablet Take 2,000 Units by mouth daily.    [provider]  clopidogrel (PLAVIX) 75 MG tablet TAKE 1 TABLET BY MOUTH EVERY DAY 12/25/23   Cephus Shelling, MD  dicyclomine (BENTYL) 10 MG capsule TAKE 1 CAPSULE BY MOUTH THREE TIMES A DAY BEFORE MEALS 07/31/23   Meryl Dare, MD  DULoxetine (CYMBALTA) 20 MG capsule TAKE 2-3 CAPSULES BY MOUTH EVERY DAY 06/26/23   Karie Georges, MD  fluticasone [facility]) 50 MCG/ACT  nasal spray INSTILL 1 SPRAY INTO BOTH NOSTRILS DAILY 11/02/23   Karie Georges, MD  gabapentin (NEURONTIN) 600 MG tablet TAKE 1 TABLET BY MOUTH THREE TIMES A DAY 12/06/23   Karie Georges, MD  KLOR-CON M20 20 MEQ tablet TAKE 1 TABLET BY MOUTH DAILY AS NEEDED (FOR FLUID OR EDEMA). 09/19/23   Karie Georges, MD  levothyroxine (SYNTHROID) 75 MCG tablet Take 1 tablet (75 mcg total) by mouth daily before breakfast. TAKE 1 TABLETBY MOUTH DAILY BEFORE BREAKFAST MONDAY-FRIDAY, THEN 1/2 SATURDAY AND SUNDAY 10/04/23   Tereso Newcomer T, PA-C  meclizine (ANTIVERT) 25 MG tablet TAKE 1 TABLET BY MOUTH THREE TIMES A DAY AS NEEDED FOR DIZZINESS 09/19/23   Karie Georges, MD  metoprolol succinate (TOPROL-XL) 25 MG 24 hr tablet TAKE 1 TABLET BY MOUTH TWICE A DAY 11/02/23   Tonny Bollman, MD  nitroGLYCERIN (NITROSTAT) 0.4 MG SL tablet Place 1 tablet (0.4 mg total) under the tongue every 5 (five) minutes as needed for chest pain. 11/02/17 09/25/23  End, Cristal Deer, MD  nystatin-triamcinolone (MYCOLOG II) cream Apply 1 application topically 2 (two) times daily as needed (dry skin).  03/30/16   [provider]  pantoprazole (PROTONIX) 40 MG tablet TAKE 1 TABLET BY MOUTH TWICE A DAY 09/12/23   Meryl Dare, MD  rOPINIRole (REQUIP) 0.5 MG tablet Take 1 tablet (0.5 mg total) by mouth at bedtime. May Increase to 2 tablet at bedtime after 7 days if needed. 12/06/23   Karie Georges, MD  rosuvastatin (CRESTOR) 40 MG tablet TAKE 1 TABLET BY MOUTH EVERY DAY 03/23/23   Malena Peer D, RPH-CPP  tiZANidine (ZANAFLEX) 4 MG tablet Take 2 mg by mouth as needed for muscle spasms. 12/11/20   [provider]  vitamin B-12 1000 MCG tablet Take 1 tablet (1,000 mcg total) by mouth daily. 09/14/15   Eddie North, MD    Physical Exam: Vitals:   01/14/24 0715 01/14/24 0730 01/14/24 0745 01/14/24 0812  BP: (!) 155/62 (!) 175/90 (!) 130/58 (!) 173/61  Pulse: 82 86 75 77  Resp: 15 20 15  (!) 29   Temp:      TempSrc:    Oral  SpO2: 97% 100% 97% 100%  Weight:    46.1 kg  Height:    5\' 3"  (1.6 m)    Constitutional: NAD, calm, comfortable Vitals:   01/14/24 0715 01/14/24 0730 01/14/24 0745 01/14/24 0812  BP: (!) 155/62 Marland Kitchen)  175/90 (!) 130/58 (!) 173/61  Pulse: 82 86 75 77  Resp: 15 20 15  (!) 29  Temp:      TempSrc:    Oral  SpO2: 97% 100% 97% 100%  Weight:    46.1 kg  Height:    5\' 3"  (1.6 m)   Eyes: lids and conjunctivae normal Neck: normal, supple Respiratory: clear to auscultation bilaterally. Normal respiratory effort. No accessory muscle use.  Cardiovascular: Regular rate and rhythm, no murmurs. Abdomen: no tenderness, no distention. Bowel sounds positive.  Musculoskeletal:  No edema. Skin: no rashes, lesions, ulcers.  Psychiatric: Flat affect  Labs on Admission: I have personally reviewed following labs and imaging studies  CBC: Recent Labs  Lab 01/14/24 0602  WBC 10.5  NEUTROABS 8.5*  HGB 11.3*  HCT 34.4*  MCV 94.0  PLT 350   Basic Metabolic Panel: Recent Labs  Lab 01/14/24 0602 01/14/24 0743  NA 134* 135  K 2.0* 2.2*  CL 98 105  CO2 22 21*  GLUCOSE 119* 100*  BUN 32* 31*  CREATININE 1.88* 1.79*  CALCIUM 9.1 7.9*  MG  --  1.7   GFR: Estimated Creatinine Clearance: 22.2 mL/min (A) (by C-G formula based on SCr of 1.79 mg/dL (H)). Liver Function Tests: Recent Labs  Lab 01/14/24 0602 01/14/24 0743  AST 16 12*  ALT 13 11  ALKPHOS 124 102  BILITOT 0.6 0.6  PROT 7.3 6.1*  ALBUMIN 3.2* 2.7*   Recent Labs  Lab 01/14/24 0602  LIPASE 46   No results for input(s): "AMMONIA" in the last 168 hours. Coagulation Profile: No results for input(s): "INR", "PROTIME" in the last 168 hours. Cardiac Enzymes: No results for input(s): "CKTOTAL", "CKMB", "CKMBINDEX", "TROPONINI" in the last 168 hours. BNP (last 3 results) No results for input(s): "PROBNP" in the last 8760 hours. HbA1C: No results for input(s): "HGBA1C" in the last 72  hours. CBG: No results for input(s): "GLUCAP" in the last 168 hours. Lipid Profile: No results for input(s): "CHOL", "HDL", "LDLCALC", "TRIG", "CHOLHDL", "LDLDIRECT" in the last 72 hours. Thyroid Function Tests: No results for input(s): "TSH", "T4TOTAL", "FREET4", "T3FREE", "THYROIDAB" in the last 72 hours. Anemia Panel: No results for input(s): "VITAMINB12", "FOLATE", "FERRITIN", "TIBC", "IRON", "RETICCTPCT" in the last 72 hours. Urine analysis:    Component Value Date/Time   COLORURINE YELLOW 01/14/2024 0603   APPEARANCEUR CLEAR 01/14/2024 0603   LABSPEC 1.010 01/14/2024 0603   PHURINE 6.0 01/14/2024 0603   GLUCOSEU NEGATIVE 01/14/2024 0603   HGBUR SMALL (A) 01/14/2024 0603   BILIRUBINUR NEGATIVE 01/14/2024 0603   BILIRUBINUR negative 09/06/2018 1057   KETONESUR NEGATIVE 01/14/2024 0603   PROTEINUR 30 (A) 01/14/2024 0603   UROBILINOGEN 0.2 09/06/2018 1057   UROBILINOGEN 0.2 09/12/2015 1225   NITRITE NEGATIVE 01/14/2024 0603   LEUKOCYTESUR SMALL (A) 01/14/2024 0603    Radiological Exams on Admission: No results found.  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)   Restless leg syndrome   Essential hypertension    Prerenal AKI secondary to dehydration from intractable nausea/vomiting and diarrhea -Continue IV fluid and monitor  strict I's and O's -Avoid nephrotoxic agents -Clear liquid diet and advance as tolerated -Symptomatic control with IV Zofran and Lomotil -Check GI pathogen panel -No recent antibiotic use  Severe  hypokalemia related to above -Continue to replete and monitor on telemetry -Monitor carefully  Mild hyponatremia -Monitor carefully with IV fluid, likely in the setting of dehydration  Mild anemia -No overt bleeding, monitor CBC  PVD/chronic mesenteric ischemia/dyslipidemia -Prior balloon angioplasty of SMA and celiac artery 02/2020 as well as SMA and celiac artery stenting 05/2018 and aortobifem bypass in 2018 and right femoral to popliteal bypass with Dr. Arbie Cookey -Continue aspirin and statin  GERD -PPI  Hypothyroidism -Continue levothyroxine and check TSH  Neuropathy due to PVD -Continue gabapentin/Requip/tizanidine  Hypertension-controlled -Continue amlodipine  Chronic headaches -Uses Tylenol with relief, continue same  Anxiety/depression -Continue Cymbalta   DVT prophylaxis: Heparin Code Status: Full Family Communication: Husband at bedside 2/23 Disposition Plan: Admit for IV fluid and potassium repletion Consults called: None Admission status: Inpatient, SDU  Severity of Illness: The appropriate patient status for this patient is INPATIENT. Inpatient status is judged to be reasonable and necessary in order to provide the required intensity of service to ensure the patient's safety. The patient's presenting symptoms, physical exam findings, and initial radiographic and laboratory data in the context of their chronic comorbidities is felt to place them at high risk for further clinical deterioration. Furthermore, it is not anticipated that the patient will be medically stable for discharge from the hospital within 2 midnights of admission.   * I certify that at the point of admission it is my clinical judgment that the patient will require inpatient hospital care spanning beyond 2 midnights from the point of admission due to high intensity of service, high risk for further deterioration and high frequency of surveillance required.*   Kaylie Ritter D Carmalita Wakefield DO [facility]  If 7PM-7AM, please contact night-coverage [email or web address]  01/14/2024, 10:12 AM

## 2024-01-15 DIAGNOSIS — N179 Acute kidney failure, unspecified: Secondary | ICD-10-CM | POA: Diagnosis not present

## 2024-01-15 LAB — CBC
HCT: 32.9 % — ABNORMAL LOW (ref 36.0–46.0)
Hemoglobin: 10.3 g/dL — ABNORMAL LOW (ref 12.0–15.0)
MCH: 30.8 pg (ref 26.0–34.0)
MCHC: 31.3 g/dL (ref 30.0–36.0)
MCV: 98.5 fL (ref 80.0–100.0)
Platelets: 418 10*3/uL — ABNORMAL HIGH (ref 150–400)
RBC: 3.34 MIL/uL — ABNORMAL LOW (ref 3.87–5.11)
RDW: 13.9 % (ref 11.5–15.5)
WBC: 10.8 10*3/uL — ABNORMAL HIGH (ref 4.0–10.5)
nRBC: 0 % (ref 0.0–0.2)

## 2024-01-15 LAB — COMPREHENSIVE METABOLIC PANEL
ALT: 11 U/L (ref 0–44)
AST: 15 U/L (ref 15–41)
Albumin: 3.1 g/dL — ABNORMAL LOW (ref 3.5–5.0)
Alkaline Phosphatase: 108 U/L (ref 38–126)
Anion gap: 10 (ref 5–15)
BUN: 20 mg/dL (ref 8–23)
CO2: 21 mmol/L — ABNORMAL LOW (ref 22–32)
Calcium: 8.5 mg/dL — ABNORMAL LOW (ref 8.9–10.3)
Chloride: 109 mmol/L (ref 98–111)
Creatinine, Ser: 1.36 mg/dL — ABNORMAL HIGH (ref 0.44–1.00)
GFR, Estimated: 43 mL/min — ABNORMAL LOW (ref >60)
Glucose, Bld: 95 mg/dL (ref 70–99)
Potassium: 3 mmol/L — ABNORMAL LOW (ref 3.5–5.1)
Sodium: 140 mmol/L (ref 135–145)
Total Bilirubin: 0.7 mg/dL (ref 0.0–1.2)
Total Protein: 6.6 g/dL (ref 6.5–8.1)

## 2024-01-15 LAB — HIV ANTIBODY (ROUTINE TESTING W REFLEX): HIV Screen 4th Generation wRfx: NONREACTIVE

## 2024-01-15 LAB — T4, FREE: Free T4: 1.19 ng/dL — ABNORMAL HIGH (ref 0.61–1.12)

## 2024-01-15 LAB — MAGNESIUM: Magnesium: 1.8 mg/dL (ref 1.7–2.4)

## 2024-01-15 MED ORDER — POTASSIUM CHLORIDE CRYS ER 20 MEQ PO TBCR
40.0000 meq | EXTENDED_RELEASE_TABLET | Freq: Two times a day (BID) | ORAL | Status: AC
  Administered 2024-01-15 (×2): 40 meq via ORAL
  Filled 2024-01-15 (×2): qty 2

## 2024-01-15 NOTE — TOC CM/SW Note (Signed)
 Transition of Care [facility]) - Inpatient Brief Assessment  Patient Details  Name: Erin [name] MRN: [id number] Date of Birth: [birth year]  Transition of Care [facility] CM/SW Contact:    Villa Herb, LCSWA Phone Number: 01/15/2024, 9:52 AM  Clinical Narrative: Transition of Care Department [facility]) has reviewed patient and no TOC needs have been identified at this time. We will continue to monitor patient advancement through interdiciplinary progression rounds. If new patient transition needs arise, please place a TOC consult.   Transition of Care Asessment: Insurance and Status: Insurance coverage has been reviewed Patient has primary care physician: Yes Home environment has been reviewed: From home Prior level of function:: Independent Prior/Current Home Services: No current home services Social Drivers of Health Review: SDOH reviewed no interventions necessary Readmission risk has been reviewed: Yes Transition of care needs: no transition of care needs at this time

## 2024-01-15 NOTE — Care Management Important Message (Signed)
 Important Message  Patient Details  Name: Erin [name] MRN: [id number] Date of Birth: [birth year]   Important Message Given:  N/A - LOS <3 / Initial given by admissions     Corey Harold 01/15/2024, 10:24 AM

## 2024-01-15 NOTE — Progress Notes (Signed)
 PROGRESS NOTE    Erin [name]  JXB:[id number] DOB: Apr 30, 1956 DOA: 01/14/2024 PCP: Karie Georges, MD   Brief Narrative:     Erin [name] is a 68 y.o. female with medical history significant for hypertension, dyslipidemia, severe PVD with significant prior interventions, mesenteric ischemia, hypothyroidism, GERD, and anxiety/depression who presented to the ED with 1 week onset of nausea, vomiting, and diarrhea with poor oral intake.  She was admitted with prerenal AKI secondary to dehydration from intractable nausea/vomiting/diarrhea with possible acute gastroenteritis.  She was also noted to have severe hypokalemia.  Her labs are now showing signs of improvement and her diet is being advanced.  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)   Restless leg syndrome   Essential hypertension  Assessment and Plan:   Prerenal AKI secondary to dehydration from intractable nausea/vomiting and diarrhea-improved -Continue IV fluid and monitor strict I's and O's -Avoid nephrotoxic agents -Admit to heart healthy diet -Symptomatic control with IV Zofran and Lomotil -Check GI pathogen panel, if further stools, but diarrhea seems to have resolved -No recent antibiotic use   Severe hypokalemia related to above-improved -Continue to replete and monitor on telemetry, okay to transfer -Monitor carefully   Mild anemia -No overt bleeding, monitor CBC   PVD/chronic mesenteric ischemia/dyslipidemia -Prior balloon angioplasty of SMA  and celiac artery 02/2020 as well as SMA and celiac artery stenting 05/2018 and aortobifem bypass in 2018 and right femoral to popliteal bypass with Dr. Arbie Cookey -Continue aspirin and statin   GERD -PPI   Hypothyroidism -Continue levothyroxine and TSH 9.172, check free T4   Neuropathy due to PVD -Continue gabapentin/Requip/tizanidine   Hypertension-controlled -Continue amlodipine   Chronic headaches -Uses Tylenol with relief, continue same   Anxiety/depression -Continue Cymbalta     DVT prophylaxis:Heparin Code Status: Full Family Communication: Husband on phone 2/24 Disposition Plan:  Status is: Inpatient Remains inpatient appropriate because: Need for IV fluids and medications.  Consultants:  None  Procedures:  None  Antimicrobials:  None   Subjective: Patient seen and evaluated today and overall has improvement in renal function as well as hypokalemia, but continues to have some fatigue.  She is eager to try diet.  Objective: Vitals:   01/15/24 0700 01/15/24 0753 01/15/24 0800 01/15/24 0900  BP: (!) 158/62  (!) 158/81 (!) 151/54  Pulse: 83  73 91  Resp: (!) 32  18 (!) 25  Temp:  (!) 97.5 F (36.4 C)    TempSrc:  Axillary    SpO2: 96%  98% 98%  Weight:      Height:        Intake/Output Summary (Last 24 hours) at 01/15/2024 1002 Last data filed at 01/15/2024 0300 Gross per 24 hour  Intake 600 ml  Output 1400 ml  Net -800 ml   Filed Weights   01/14/24 0812 01/15/24 0530  Weight: 46.1 kg 46 kg    Examination:  General exam: Appears calm and comfortable  Respiratory system: Clear to auscultation. Respiratory effort normal. Cardiovascular system: S1 & S2 heard, RRR.  Gastrointestinal  system: Abdomen is soft Central nervous system: Alert and awake Extremities: No edema Skin: No significant lesions noted Psychiatry: Flat affect.    Data Reviewed: I have personally reviewed following labs and imaging studies  CBC: Recent Labs  Lab 01/14/24 0602  01/15/24 0427  WBC 10.5 10.8*  NEUTROABS 8.5*  --   HGB 11.3* 10.3*  HCT 34.4* 32.9*  MCV 94.0 98.5  PLT 350 418*   Basic Metabolic Panel: Recent Labs  Lab 01/14/24 0602 01/14/24 0743 01/15/24 0427  NA 134* 135 140  K 2.0* 2.2* 3.0*  CL 98 105 109  CO2 22 21* 21*  GLUCOSE 119* 100* 95  BUN 32* 31* 20  CREATININE 1.88* 1.79* 1.36*  CALCIUM 9.1 7.9* 8.5*  MG  --  1.7 1.8   GFR: Estimated Creatinine Clearance: 29.1 mL/min (A) (by C-G formula based on SCr of 1.36 mg/dL (H)). Liver Function Tests: Recent Labs  Lab 01/14/24 0602 01/14/24 0743 01/15/24 0427  AST 16 12* 15  ALT 13 11 11   ALKPHOS 124 102 108  BILITOT 0.6 0.6 0.7  PROT 7.3 6.1* 6.6  ALBUMIN 3.2* 2.7* 3.1*   Recent Labs  Lab 01/14/24 0602  LIPASE 46   No results for input(s): "AMMONIA" in the last 168 hours. Coagulation Profile: No results for input(s): "INR", "PROTIME" in the last 168 hours. Cardiac Enzymes: No results for input(s): "CKTOTAL", "CKMB", "CKMBINDEX", "TROPONINI" in the last 168 hours. BNP (last 3 results) No results for input(s): "PROBNP" in the last 8760 hours. HbA1C: No results for input(s): "HGBA1C" in the last 72 hours. CBG: No results for input(s): "GLUCAP" in the last 168 hours. Lipid Profile: No results for input(s): "CHOL", "HDL", "LDLCALC", "TRIG", "CHOLHDL", "LDLDIRECT" in the last 72 hours. Thyroid Function Tests: Recent Labs    01/14/24 0743  TSH 9.172*   Anemia Panel: No results for input(s): "VITAMINB12", "FOLATE", "FERRITIN", "TIBC", "IRON", "RETICCTPCT" in the last 72 hours. Sepsis Labs: No results for input(s): "PROCALCITON", "LATICACIDVEN" in the last 168 hours.  Recent Results (from the past 240 hours)  Resp panel by RT-PCR (RSV, Flu A&B, Covid) Anterior Nasal Swab     Status: None   Collection Time: 01/14/24  5:15 AM   Specimen: Anterior Nasal Swab  Result Value Ref Range Status   SARS Coronavirus 2 by RT PCR NEGATIVE NEGATIVE Final    Comment:  (NOTE) SARS-CoV-2 target nucleic acids are NOT DETECTED.  The SARS-CoV-2 RNA is generally detectable in upper respiratory specimens during the acute phase of infection. The lowest concentration of SARS-CoV-2 viral copies this assay can detect is 138 copies/mL. A negative result does not preclude SARS-Cov-2 infection and should not be used as the sole basis for treatment or other patient management decisions. A negative result may occur with  improper specimen collection/handling, submission of specimen other than nasopharyngeal swab, presence of viral mutation(s) within the areas targeted by this assay, and inadequate number of viral copies(<138 copies/mL). A negative result must be combined with clinical observations, patient history, and epidemiological information. The expected result is Negative.  Fact Sheet for Patients:  BloggerCourse.com  Fact Sheet for Healthcare Providers:  SeriousBroker.it  This test is no t yet approved or cleared by the [place] FDA and  has been authorized for detection and/or diagnosis of SARS-CoV-2 by FDA under an Emergency Use Authorization (EUA). This EUA will remain  in effect (meaning this test can be used) for the duration of the COVID-19 declaration under Section 564(b)(1) of the Act, 21  U.S.C.section 360bbb-3(b)(1), unless the authorization is terminated  or revoked sooner.       Influenza A by PCR NEGATIVE NEGATIVE Final   Influenza B by PCR NEGATIVE NEGATIVE Final    Comment: (NOTE) The Xpert Xpress SARS-CoV-2/FLU/RSV plus assay is intended as an aid in the diagnosis of influenza from Nasopharyngeal swab specimens and should not be used as a sole basis for treatment. Nasal washings and aspirates are unacceptable for Xpert Xpress SARS-CoV-2/FLU/RSV testing.  Fact Sheet for Patients: BloggerCourse.com  Fact Sheet for Healthcare  Providers: SeriousBroker.it  This test is not yet approved or cleared by the [place] FDA and has been authorized for detection and/or diagnosis of SARS-CoV-2 by FDA under an Emergency Use Authorization (EUA). This EUA will remain in effect (meaning this test can be used) for the duration of the COVID-19 declaration under Section 564(b)(1) of the Act, 21 U.S.C. section 360bbb-3(b)(1), unless the authorization is terminated or revoked.     Resp Syncytial Virus by PCR NEGATIVE NEGATIVE Final    Comment: (NOTE) Fact Sheet for Patients: BloggerCourse.com  Fact Sheet for Healthcare Providers: SeriousBroker.it  This test is not yet approved or cleared by the [place] FDA and has been authorized for detection and/or diagnosis of SARS-CoV-2 by FDA under an Emergency Use Authorization (EUA). This EUA will remain in effect (meaning this test can be used) for the duration of the COVID-19 declaration under Section 564(b)(1) of the Act, 21 U.S.C. section 360bbb-3(b)(1), unless the authorization is terminated or revoked.  Performed at [facility], [address], [place], [place] [address]   MRSA Next Gen by PCR, Nasal     Status: None   Collection Time: 01/14/24  8:07 AM   Specimen: Nasal Mucosa; Nasal Swab  Result Value Ref Range Status   MRSA by PCR Next Gen NOT DETECTED NOT DETECTED Final    Comment: (NOTE) The GeneXpert MRSA Assay (FDA approved for NASAL specimens only), is one component of a comprehensive MRSA colonization surveillance program. It is not intended to diagnose MRSA infection nor to guide or monitor treatment for MRSA infections. Test performance is not FDA approved in patients less than 2 years old. Performed at [facility], [address], [place], [place] [address]          Radiology Studies: No results found.      Scheduled Meds:  amLODipine  5 mg Oral Daily    aspirin EC  81 mg Oral Daily   Chlorhexidine Gluconate Cloth  6 each Topical Q0600   clopidogrel  75 mg Oral Daily   cyanocobalamin  1,000 mcg Oral Daily   dicyclomine  10 mg Oral TID AC   DULoxetine  20 mg Oral Daily   fluticasone  1 spray Each Nare Daily   gabapentin  600 mg Oral BID   heparin  5,000 Units Subcutaneous Q8H   levothyroxine  75 mcg Oral QAC breakfast   metoprolol succinate  25 mg Oral BID   pantoprazole  40 mg Oral BID   potassium chloride  40 mEq Oral BID   rOPINIRole  0.5 mg Oral QHS   rosuvastatin  40 mg Oral Daily   Continuous Infusions:  sodium chloride Stopped (01/15/24 0933)     LOS: 1 day    Time spent: 35 minutes    Erin Fotheringham Hoover Brunette, DO [facility]  If 7PM-7AM, please contact night-coverage [email or web address] 01/15/2024, 10:02 AM

## 2024-01-16 ENCOUNTER — Encounter (HOSPITAL_COMMUNITY): Payer: Self-pay | Admitting: Internal Medicine

## 2024-01-16 DIAGNOSIS — N179 Acute kidney failure, unspecified: Secondary | ICD-10-CM | POA: Diagnosis not present

## 2024-01-16 LAB — GASTROINTESTINAL PANEL BY PCR, STOOL (REPLACES STOOL CULTURE)

## 2024-01-16 LAB — CBC
HCT: 28.3 % — ABNORMAL LOW (ref 36.0–46.0)
Hemoglobin: 9.2 g/dL — ABNORMAL LOW (ref 12.0–15.0)
MCH: 31.8 pg (ref 26.0–34.0)
MCHC: 32.5 g/dL (ref 30.0–36.0)
MCV: 97.9 fL (ref 80.0–100.0)
Platelets: 355 10*3/uL (ref 150–400)
RBC: 2.89 MIL/uL — ABNORMAL LOW (ref 3.87–5.11)
RDW: 13.9 % (ref 11.5–15.5)
WBC: 8.1 10*3/uL (ref 4.0–10.5)
nRBC: 0 % (ref 0.0–0.2)

## 2024-01-16 LAB — BASIC METABOLIC PANEL
Anion gap: 8 (ref 5–15)
BUN: 16 mg/dL (ref 8–23)
CO2: 23 mmol/L (ref 22–32)
Calcium: 8.1 mg/dL — ABNORMAL LOW (ref 8.9–10.3)
Chloride: 107 mmol/L (ref 98–111)
Creatinine, Ser: 1.12 mg/dL — ABNORMAL HIGH (ref 0.44–1.00)
GFR, Estimated: 54 mL/min — ABNORMAL LOW (ref >60)
Glucose, Bld: 89 mg/dL (ref 70–99)
Potassium: 3.4 mmol/L — ABNORMAL LOW (ref 3.5–5.1)
Sodium: 138 mmol/L (ref 135–145)

## 2024-01-16 LAB — MAGNESIUM: Magnesium: 1.3 mg/dL — ABNORMAL LOW (ref 1.7–2.4)

## 2024-01-16 MED ORDER — LACTATED RINGERS IV SOLN: INTRAVENOUS | Status: AC

## 2024-01-16 MED ORDER — POTASSIUM CHLORIDE CRYS ER 20 MEQ PO TBCR
40.0000 meq | EXTENDED_RELEASE_TABLET | Freq: Two times a day (BID) | ORAL | Status: AC
  Administered 2024-01-16 (×2): 40 meq via ORAL
  Filled 2024-01-16 (×2): qty 2

## 2024-01-16 MED ORDER — MAGNESIUM SULFATE 2 GM/50ML IV SOLN
2.0000 g | Freq: Once | INTRAVENOUS | Status: AC
  Administered 2024-01-16: 2 g via INTRAVENOUS
  Filled 2024-01-16: qty 50

## 2024-01-16 NOTE — Plan of Care (Signed)

## 2024-01-16 NOTE — Progress Notes (Signed)
 PROGRESS NOTE    Erin [name]  ZOX:[id number] DOB: 26-Jul-1956 DOA: 01/14/2024 PCP: Karie Georges, MD   Brief Narrative:     Erin [name] is a 68 y.o. female with medical history significant for hypertension, dyslipidemia, severe PVD with significant prior interventions, mesenteric ischemia, hypothyroidism, GERD, and anxiety/depression who presented to the ED with 1 week onset of nausea, vomiting, and diarrhea with poor oral intake.  She was admitted with prerenal AKI secondary to dehydration from intractable nausea/vomiting/diarrhea with possible acute gastroenteritis.  She was also noted to have severe hypokalemia.  Her labs are now showing signs of improvement and her diet is being advanced.  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)   Restless leg syndrome   Essential hypertension  Assessment and Plan:   Prerenal AKI secondary to dehydration from intractable nausea/vomiting and diarrhea-improved -Continue IV fluid and monitor strict I's and O's -Avoid nephrotoxic agents -Admit to heart healthy diet -Symptomatic control with IV Zofran and Lomotil -GI pathogen panel negative -No recent antibiotic use   Severe hypokalemia related to above-improved/hypomagnesemia -Continue to replete and monitor on telemetry, okay to transfer -Monitor carefully   Mild anemia -No overt bleeding, monitor CBC   PVD/chronic mesenteric ischemia/dyslipidemia -Prior balloon angioplasty of SMA and celiac artery 02/2020 as well as  SMA and celiac artery stenting 05/2018 and aortobifem bypass in 2018 and right femoral to popliteal bypass with Dr. Arbie Cookey -Continue aspirin and statin   GERD -PPI   Hypothyroidism -Continue levothyroxine and TSH 9.172, free T4 1.19   Neuropathy due to PVD -Continue gabapentin/Requip/tizanidine   Hypertension-controlled -Continue amlodipine   Chronic headaches -Uses Tylenol with relief, continue same   Anxiety/depression -Continue Cymbalta     DVT prophylaxis:Heparin Code Status: Full Family Communication: Husband at bedside 2/25 Disposition Plan:  Status is: Inpatient Remains inpatient appropriate because: Need for IV fluids and medications.  Consultants:  None  Procedures:  None  Antimicrobials:  None   Subjective: Patient seen and evaluated today and overall has improvement in renal function as well as hypokalemia, but continues to have some fatigue and overall does not feel well.  Her dietary intake is slowly improving.  Objective: Vitals:   01/15/24 2009 01/15/24 2331 01/16/24 0421 01/16/24 1319  BP: 125/65 136/64 (!) 140/71 (!) 116/55  Pulse: 69 69  73  Resp: 20  20   Temp: (!) 97.5 F (36.4 C) 98.5 F (36.9 C) 98.6 F (37 C) 98.4 F (36.9 C)  TempSrc: Oral Oral Oral Oral  SpO2: 98% 94% 98% 100%  Weight:      Height:        Intake/Output Summary (Last 24 hours) at 01/16/2024 1333 Last data filed at 01/16/2024 1100 Gross per 24 hour  Intake 480 ml  Output --  Net 480 ml   Filed Weights   01/14/24 0812 01/15/24 0530  Weight: 46.1 kg 46 kg    Examination:  General exam: Appears calm and comfortable  Respiratory system: Clear to auscultation. Respiratory effort normal. Cardiovascular system: S1 & S2 heard, RRR.  Gastrointestinal system: Abdomen is soft Central nervous system: Alert and awake Extremities: No edema Skin: No significant lesions noted Psychiatry: Flat affect.    Data Reviewed: I have personally reviewed following labs and  imaging studies  CBC: Recent Labs  Lab 01/14/24 0602 01/15/24 0427 01/16/24 0446  WBC 10.5 10.8* 8.1  NEUTROABS 8.5*  --   --   HGB 11.3* 10.3* 9.2*  HCT 34.4* 32.9* 28.3*  MCV 94.0 98.5 97.9  PLT 350 418* 355   Basic Metabolic Panel: Recent Labs  Lab 01/14/24 0602 01/14/24 0743 01/15/24 0427 01/16/24 0446  NA 134* 135 140 138  K 2.0* 2.2* 3.0* 3.4*  CL 98 105 109 107  CO2 22 21* 21* 23  GLUCOSE 119* 100* 95 89  BUN 32* 31* 20 16  CREATININE 1.88* 1.79* 1.36* 1.12*  CALCIUM 9.1 7.9* 8.5* 8.1*  MG  --  1.7 1.8 1.3*   GFR: Estimated Creatinine Clearance: 35.4 mL/min (A) (by C-G formula based on SCr of 1.12 mg/dL (H)). Liver Function Tests: Recent Labs  Lab 01/14/24 0602 01/14/24 0743 01/15/24 0427  AST 16 12* 15  ALT 13 11 11   ALKPHOS 124 102 108  BILITOT 0.6 0.6 0.7  PROT 7.3 6.1* 6.6  ALBUMIN 3.2* 2.7* 3.1*   Recent Labs  Lab 01/14/24 0602  LIPASE 46   No results for input(s): "AMMONIA" in the last 168 hours. Coagulation Profile: No results for input(s): "INR", "PROTIME" in the last 168 hours. Cardiac Enzymes: No results for input(s): "CKTOTAL", "CKMB", "CKMBINDEX", "TROPONINI" in the last 168 hours. BNP (last 3 results) No results for input(s): "PROBNP" in the last 8760 hours. HbA1C: No results for input(s): "HGBA1C" in the last 72 hours. CBG: No results for input(s): "GLUCAP" in the last 168 hours. Lipid Profile: No results for input(s): "CHOL", "HDL", "LDLCALC", "TRIG", "CHOLHDL", "LDLDIRECT" in the last 72 hours. Thyroid Function Tests: Recent Labs    01/14/24 0743 01/15/24 1217  TSH 9.172*  --   FREET4  --  1.19*   Anemia Panel: No results for input(s): "VITAMINB12", "FOLATE", "FERRITIN", "TIBC", "IRON", "RETICCTPCT" in the last 72 hours. Sepsis Labs: No results for input(s): "PROCALCITON", "LATICACIDVEN" in the last 168 hours.  Recent Results (from the past 240 hours)  Resp panel by RT-PCR (RSV, Flu A&B, Covid) Anterior Nasal Swab      Status: None   Collection Time: 01/14/24  5:15 AM   Specimen: Anterior Nasal Swab  Result Value Ref Range Status   SARS Coronavirus 2 by RT PCR NEGATIVE NEGATIVE Final    Comment: (NOTE) SARS-CoV-2 target nucleic acids are NOT DETECTED.  The SARS-CoV-2 RNA is generally detectable in upper respiratory specimens during the acute phase of infection. The lowest concentration of SARS-CoV-2 viral copies this assay can detect is 138 copies/mL. A negative result does not preclude SARS-Cov-2 infection and should not be used as the sole basis for treatment or other patient management decisions. A negative result may occur with  improper specimen collection/handling, submission of specimen other than nasopharyngeal swab, presence of viral mutation(s) within the areas targeted by this assay, and inadequate number of viral copies(<138 copies/mL). A negative result must be combined with clinical observations, patient history, and epidemiological information. The expected result is Negative.  Fact Sheet for Patients:  BloggerCourse.com  Fact Sheet for Healthcare Providers:  SeriousBroker.it  This test is no t yet approved or cleared by the [place] FDA and  has been authorized for detection and/or diagnosis of SARS-CoV-2 by FDA  under an Emergency Use Authorization (EUA). This EUA will remain  in effect (meaning this test can be used) for the duration of the COVID-19 declaration under Section 564(b)(1) of the Act, 21 U.S.C.section 360bbb-3(b)(1), unless the authorization is terminated  or revoked sooner.       Influenza A by PCR NEGATIVE NEGATIVE Final   Influenza B by PCR NEGATIVE NEGATIVE Final    Comment: (NOTE) The Xpert Xpress SARS-CoV-2/FLU/RSV plus assay is intended as an aid in the diagnosis of influenza from Nasopharyngeal swab specimens and should not be used as a sole basis for treatment. Nasal washings and aspirates are  unacceptable for Xpert Xpress SARS-CoV-2/FLU/RSV testing.  Fact Sheet for Patients: BloggerCourse.com  Fact Sheet for Healthcare Providers: SeriousBroker.it  This test is not yet approved or cleared by the [place] FDA and has been authorized for detection and/or diagnosis of SARS-CoV-2 by FDA under an Emergency Use Authorization (EUA). This EUA will remain in effect (meaning this test can be used) for the duration of the COVID-19 declaration under Section 564(b)(1) of the Act, 21 U.S.C. section 360bbb-3(b)(1), unless the authorization is terminated or revoked.     Resp Syncytial Virus by PCR NEGATIVE NEGATIVE Final    Comment: (NOTE) Fact Sheet for Patients: BloggerCourse.com  Fact Sheet for Healthcare Providers: SeriousBroker.it  This test is not yet approved or cleared by the [place] FDA and has been authorized for detection and/or diagnosis of SARS-CoV-2 by FDA under an Emergency Use Authorization (EUA). This EUA will remain in effect (meaning this test can be used) for the duration of the COVID-19 declaration under Section 564(b)(1) of the Act, 21 U.S.C. section 360bbb-3(b)(1), unless the authorization is terminated or revoked.  Performed at [facility], [address], [place], [place] [address]   MRSA Next Gen by PCR, Nasal     Status: None   Collection Time: 01/14/24  8:07 AM   Specimen: Nasal Mucosa; Nasal Swab  Result Value Ref Range Status   MRSA by PCR Next Gen NOT DETECTED NOT DETECTED Final    Comment: (NOTE) The GeneXpert MRSA Assay (FDA approved for NASAL specimens only), is one component of a comprehensive MRSA colonization surveillance program. It is not intended to diagnose MRSA infection nor to guide or monitor treatment for MRSA infections. Test performance is not FDA approved in patients less than 46 years old. Performed at [facility], [address], [place], [place] [address]   Gastrointestinal Panel by PCR , Stool     Status: None   Collection Time: 01/15/24 11:21 AM   Specimen: Stool  Result Value Ref Range Status   Campylobacter species NOT DETECTED NOT DETECTED Final   Plesimonas shigelloides NOT DETECTED NOT DETECTED Final   Salmonella species NOT DETECTED NOT DETECTED Final   Yersinia enterocolitica NOT DETECTED NOT DETECTED Final   Vibrio species NOT DETECTED NOT DETECTED Final   Vibrio cholerae NOT DETECTED NOT DETECTED Final   Enteroaggregative E coli (EAEC) NOT DETECTED NOT DETECTED Final   Enteropathogenic E coli (EPEC) NOT DETECTED NOT DETECTED Final   Enterotoxigenic E coli (ETEC) NOT DETECTED NOT DETECTED Final   Shiga like toxin producing E coli (STEC) NOT DETECTED NOT DETECTED Final   Shigella/Enteroinvasive E coli (EIEC) NOT DETECTED NOT DETECTED Final   Cryptosporidium NOT DETECTED NOT DETECTED Final   Cyclospora cayetanensis NOT DETECTED NOT DETECTED Final   Entamoeba histolytica NOT DETECTED NOT DETECTED Final   Giardia lamblia NOT DETECTED NOT DETECTED Final   Adenovirus F40/41  NOT DETECTED NOT DETECTED Final   Astrovirus NOT DETECTED NOT DETECTED Final   Norovirus GI/GII NOT DETECTED NOT DETECTED Final   Rotavirus A NOT DETECTED NOT DETECTED Final   Sapovirus (I, II, IV, and V) NOT DETECTED NOT DETECTED Final    Comment: Performed at [facility], [address], [place], [place] [address]         Radiology Studies: No results found.      Scheduled Meds:  amLODipine  5 mg Oral Daily   aspirin EC  81 mg Oral Daily   Chlorhexidine Gluconate Cloth  6 each Topical Q0600   clopidogrel  75 mg Oral Daily   cyanocobalamin  1,000 mcg Oral Daily   dicyclomine  10 mg Oral TID AC   DULoxetine  20 mg Oral Daily   fluticasone  1 spray Each Nare Daily   gabapentin  600 mg Oral BID   heparin  5,000 Units Subcutaneous Q8H   levothyroxine  75 mcg Oral QAC breakfast   metoprolol  succinate  25 mg Oral BID   pantoprazole  40 mg Oral BID   potassium chloride  40 mEq Oral BID   rOPINIRole  0.5 mg Oral QHS   rosuvastatin  40 mg Oral Daily   Continuous Infusions:  lactated ringers       LOS: 2 days    Time spent: 35 minutes    Jadene Stemmer Hoover Brunette, DO [facility]  If 7PM-7AM, please contact night-coverage [email or web address] 01/16/2024, 1:33 PM

## 2024-01-17 DIAGNOSIS — N179 Acute kidney failure, unspecified: Secondary | ICD-10-CM | POA: Diagnosis not present

## 2024-01-17 LAB — BASIC METABOLIC PANEL
Anion gap: 9 (ref 5–15)
BUN: 16 mg/dL (ref 8–23)
CO2: 25 mmol/L (ref 22–32)
Calcium: 8.5 mg/dL — ABNORMAL LOW (ref 8.9–10.3)
Chloride: 105 mmol/L (ref 98–111)
Creatinine, Ser: 1.12 mg/dL — ABNORMAL HIGH (ref 0.44–1.00)
GFR, Estimated: 54 mL/min — ABNORMAL LOW (ref >60)
Glucose, Bld: 84 mg/dL (ref 70–99)
Potassium: 4.8 mmol/L (ref 3.5–5.1)
Sodium: 139 mmol/L (ref 135–145)

## 2024-01-17 LAB — CBC
HCT: 29.4 % — ABNORMAL LOW (ref 36.0–46.0)
Hemoglobin: 9.3 g/dL — ABNORMAL LOW (ref 12.0–15.0)
MCH: 31.1 pg (ref 26.0–34.0)
MCHC: 31.6 g/dL (ref 30.0–36.0)
MCV: 98.3 fL (ref 80.0–100.0)
Platelets: 389 10*3/uL (ref 150–400)
RBC: 2.99 MIL/uL — ABNORMAL LOW (ref 3.87–5.11)
RDW: 13.7 % (ref 11.5–15.5)
WBC: 9.3 10*3/uL (ref 4.0–10.5)
nRBC: 0 % (ref 0.0–0.2)

## 2024-01-17 LAB — MAGNESIUM: Magnesium: 1.4 mg/dL — ABNORMAL LOW (ref 1.7–2.4)

## 2024-01-17 MED ORDER — MAGNESIUM SULFATE 2 GM/50ML IV SOLN
2.0000 g | Freq: Once | INTRAVENOUS | Status: AC
  Administered 2024-01-17: 2 g via INTRAVENOUS
  Filled 2024-01-17: qty 50

## 2024-01-17 NOTE — Plan of Care (Signed)
  Problem: Education: Goal: Knowledge of General Education information will improve Description: Including pain rating scale, medication(s)/side effects and non-pharmacologic comfort measures Outcome: Progressing   Problem: Health Behavior/Discharge Planning: Goal: Ability to manage health-related needs will improve Outcome: Progressing   Problem: Clinical Measurements: Goal: Ability to maintain clinical measurements within normal limits will improve Outcome: Progressing Goal: Will remain free from infection Outcome: Progressing Goal: Diagnostic test results will improve Outcome: Progressing   Problem: Activity: Goal: Risk for activity intolerance will decrease Outcome: Progressing   Problem: Nutrition: Goal: Adequate nutrition will be maintained Outcome: Progressing   Problem: Coping: Goal: Level of anxiety will decrease Outcome: Progressing   Problem: Elimination: Goal: Will not experience complications related to bowel motility Outcome: Progressing   Problem: Safety: Goal: Ability to remain free from injury will improve Outcome: Progressing   Problem: Skin Integrity: Goal: Risk for impaired skin integrity will decrease Outcome: Progressing

## 2024-01-17 NOTE — Progress Notes (Signed)
Discharge instructions reviewed with patient and husband. Both verbalized understanding of instructions.  Patient discharged home with husband in stable condition.   

## 2024-01-17 NOTE — Discharge Summary (Signed)
 Physician Discharge Summary  Erin [name]:[id number] DOB: 04/19/1956 DOA: 01/14/2024  PCP: Karie Georges, MD  Admit date: 01/14/2024  Discharge date: 01/17/2024  Admitted From:Home  Disposition:  Home  Recommendations for Outpatient Follow-up:  Follow up with PCP in 1-2 weeks Please obtain BMP/CBC in one week Continue home medications as prior  Home Health: None  Equipment/Devices: None  Discharge Condition:Stable  CODE STATUS: Full  Diet recommendation: Heart Healthy  Brief/Interim Summary:  Erin [name] is a 68 y.o. female with medical history significant for hypertension, dyslipidemia, severe PVD with significant prior interventions, mesenteric ischemia, hypothyroidism, GERD, and anxiety/depression who presented to the ED with 1 week onset of nausea, vomiting, and diarrhea with poor oral intake.  She was admitted with prerenal AKI secondary to dehydration from intractable nausea/vomiting/diarrhea with possible acute gastroenteritis.  She was also noted to have severe hypokalemia.  Her labs have not shown signs of improvement with stability in her creatinine levels which have not fully returned to baseline, however should in the course of the next 1-2 weeks with improvement in dietary intake.  She will need close follow-up with PCP to follow-up labs.  No other acute events or concerns noted throughout the course of this admission.  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  Discharge Instructions  Discharge Instructions     Diet - low sodium heart healthy   Complete by: As directed    Increase activity slowly   Complete by: As directed       Allergies as of 01/17/2024       Reactions   Amitiza [lubiprostone] Diarrhea   Uncontrollable diarrhea   Nortriptyline Other (See Comments)   Stomach distention   Sulfa Antibiotics Nausea And Vomiting, Other (See Comments)   GI distress/pain   Tramadol Itching, Rash   Atorvastatin Nausea And Vomiting   Claritin [loratadine] Other (See Comments)   Hot flashes        Medication List     TAKE these medications    acetaminophen 500 MG tablet Commonly known as: TYLENOL Take 1,000 mg by mouth daily as needed for headache (pain).   acyclovir ointment 5 % Commonly known as: ZOVIRAX Apply 1 Application topically every 3 (three) hours as needed (fever blisters).   amLODipine 5 MG tablet Commonly known as: NORVASC Take 1 tablet (5 mg total) by mouth daily.   aspirin EC 81 MG tablet Take 81 mg by mouth daily.   clopidogrel 75 MG tablet Commonly known as: PLAVIX TAKE 1 TABLET BY MOUTH EVERY DAY   cyanocobalamin 1000 MCG tablet Take 1 tablet (1,000 mcg total) by mouth daily.   dicyclomine 10 MG capsule Commonly known as: BENTYL TAKE 1 CAPSULE BY MOUTH THREE TIMES A DAY BEFORE MEALS   DULoxetine 20 MG capsule Commonly known as: CYMBALTA TAKE 2-3 CAPSULES BY MOUTH EVERY DAY   fluticasone 50  MCG/ACT nasal spray Commonly known as: FLONASE INSTILL 1 SPRAY INTO BOTH NOSTRILS DAILY   gabapentin 600 MG tablet Commonly known as: NEURONTIN TAKE 1 TABLET BY MOUTH THREE TIMES A DAY   Klor-Con M20 20 MEQ tablet Generic drug: potassium chloride SA TAKE 1 TABLET BY MOUTH DAILY AS NEEDED (FOR FLUID OR  EDEMA).   levothyroxine 75 MCG tablet Commonly known as: SYNTHROID Take 1 tablet (75 mcg total) by mouth daily before breakfast. TAKE 1 TABLETBY MOUTH DAILY BEFORE BREAKFAST MONDAY-FRIDAY, THEN 1/2 SATURDAY AND SUNDAY   meclizine 25 MG tablet Commonly known as: ANTIVERT TAKE 1 TABLET BY MOUTH THREE TIMES A DAY AS NEEDED FOR DIZZINESS   metoprolol succinate 25 MG 24 hr tablet Commonly known as: TOPROL-XL TAKE 1 TABLET BY MOUTH TWICE A DAY   nitroGLYCERIN 0.4 MG SL tablet Commonly known as: NITROSTAT Place 1 tablet (0.4 mg total) under the tongue every 5 (five) minutes as needed for chest pain.   nystatin-triamcinolone cream Commonly known as: MYCOLOG II Apply 1 application topically 2 (two) times daily as needed (dry skin).   pantoprazole 40 MG tablet Commonly known as: PROTONIX TAKE 1 TABLET BY MOUTH TWICE A DAY   rOPINIRole 0.5 MG tablet Commonly known as: REQUIP Take 1 tablet (0.5 mg total) by mouth at bedtime. May Increase to 2 tablet at bedtime after 7 days if needed.   rosuvastatin 40 MG tablet Commonly known as: CRESTOR TAKE 1 TABLET BY MOUTH EVERY DAY   tiZANidine 4 MG tablet Commonly known as: ZANAFLEX Take 2 mg by mouth as needed for muscle spasms.   Vitamin D 50 MCG (2000 UT) tablet Take 2,000 Units by mouth daily.        Follow-up Information     Karie Georges, MD. Schedule an appointment as soon as possible for a visit in 1 week(s).   Specialty: Family Medicine Contact information: [address] [place] [address] [phone]                Allergies  Allergen Reactions   Amitiza [Lubiprostone] Diarrhea    Uncontrollable diarrhea   Nortriptyline Other (See Comments)    Stomach distention   Sulfa Antibiotics Nausea And Vomiting and Other (See Comments)    GI distress/pain   Tramadol Itching and Rash   Atorvastatin Nausea And Vomiting   Claritin [Loratadine] Other (See Comments)    Hot flashes     Consultations: None   Procedures/Studies: No results found.   Discharge Exam: Vitals:   01/16/24 2108 01/17/24 0337  BP: (!) 129/59 (!) 160/80  Pulse: 65 74  Resp:    Temp: 98.1 F (36.7 C)   SpO2: 100% 100%   Vitals:   01/16/24 0421 01/16/24 1319 01/16/24 2108 01/17/24 0337  BP: (!) 140/71 (!) 116/55 (!) 129/59 (!) 160/80  Pulse:  73 65 74  Resp: 20     Temp: 98.6 F (37 C) 98.4 F (36.9 C) 98.1 F (36.7 C)   TempSrc: Oral Oral Oral   SpO2: 98% 100% 100% 100%  Weight:      Height:        General: Pt is alert, awake, not in acute distress Cardiovascular: RRR, S1/S2 +, no rubs, no gallops Respiratory: CTA bilaterally, no wheezing, no rhonchi Abdominal: Soft, NT, ND, bowel sounds + Extremities: no edema, no cyanosis    The results of significant diagnostics from this hospitalization (including imaging, microbiology, ancillary and laboratory) are listed below for reference.  Microbiology: Recent Results (from the past 240 hours)  Resp panel by RT-PCR (RSV, Flu A&B, Covid) Anterior Nasal Swab     Status: None   Collection Time: 01/14/24  5:15 AM   Specimen: Anterior Nasal Swab  Result Value Ref Range Status   SARS Coronavirus 2 by RT PCR NEGATIVE NEGATIVE Final    Comment: (NOTE) SARS-CoV-2 target nucleic acids are NOT DETECTED.  The SARS-CoV-2 RNA is generally detectable in upper respiratory specimens during the acute phase of infection. The lowest concentration of SARS-CoV-2 viral copies this assay can detect is 138 copies/mL. A negative result does not preclude SARS-Cov-2 infection and should not be used as the sole basis for treatment or other patient management decisions. A negative result may occur with  improper specimen collection/handling, submission of specimen other than nasopharyngeal swab, presence of viral mutation(s) within the areas targeted by this assay, and inadequate number of viral copies(<138 copies/mL). A negative result must  be combined with clinical observations, patient history, and epidemiological information. The expected result is Negative.  Fact Sheet for Patients:  BloggerCourse.com  Fact Sheet for Healthcare Providers:  SeriousBroker.it  This test is no t yet approved or cleared by the [place] FDA and  has been authorized for detection and/or diagnosis of SARS-CoV-2 by FDA under an Emergency Use Authorization (EUA). This EUA will remain  in effect (meaning this test can be used) for the duration of the COVID-19 declaration under Section 564(b)(1) of the Act, 21 U.S.C.section 360bbb-3(b)(1), unless the authorization is terminated  or revoked sooner.       Influenza A by PCR NEGATIVE NEGATIVE Final   Influenza B by PCR NEGATIVE NEGATIVE Final    Comment: (NOTE) The Xpert Xpress SARS-CoV-2/FLU/RSV plus assay is intended as an aid in the diagnosis of influenza from Nasopharyngeal swab specimens and should not be used as a sole basis for treatment. Nasal washings and aspirates are unacceptable for Xpert Xpress SARS-CoV-2/FLU/RSV testing.  Fact Sheet for Patients: BloggerCourse.com  Fact Sheet for Healthcare Providers: SeriousBroker.it  This test is not yet approved or cleared by the [place] FDA and has been authorized for detection and/or diagnosis of SARS-CoV-2 by FDA under an Emergency Use Authorization (EUA). This EUA will remain in effect (meaning this test can be used) for the duration of the COVID-19 declaration under Section 564(b)(1) of the Act, 21 U.S.C. section 360bbb-3(b)(1), unless the authorization is terminated or revoked.     Resp Syncytial Virus by PCR NEGATIVE NEGATIVE Final    Comment: (NOTE) Fact Sheet for Patients: BloggerCourse.com  Fact Sheet for Healthcare Providers: SeriousBroker.it  This test is not  yet approved or cleared by the [place] FDA and has been authorized for detection and/or diagnosis of SARS-CoV-2 by FDA under an Emergency Use Authorization (EUA). This EUA will remain in effect (meaning this test can be used) for the duration of the COVID-19 declaration under Section 564(b)(1) of the Act, 21 U.S.C. section 360bbb-3(b)(1), unless the authorization is terminated or revoked.  Performed at [facility], [address] [place], [place] [address]   MRSA Next Gen by PCR, Nasal     Status: None   Collection Time: 01/14/24  8:07 AM   Specimen: Nasal Mucosa; Nasal Swab  Result Value Ref Range Status   MRSA by PCR Next Gen NOT DETECTED NOT DETECTED Final    Comment: (NOTE) The GeneXpert MRSA Assay (FDA approved for NASAL specimens only), is one component of a comprehensive MRSA colonization surveillance program. It is  not intended to diagnose MRSA infection nor to guide or monitor treatment for MRSA infections. Test performance is not FDA approved in patients less than 58 years old. Performed at [facility], [address] [place], [place] [address]   Gastrointestinal Panel by PCR , Stool     Status: None   Collection Time: 01/15/24 11:21 AM   Specimen: Stool  Result Value Ref Range Status   Campylobacter species NOT DETECTED NOT DETECTED Final   Plesimonas shigelloides NOT DETECTED NOT DETECTED Final   Salmonella species NOT DETECTED NOT DETECTED Final   Yersinia enterocolitica NOT DETECTED NOT DETECTED Final   Vibrio species NOT DETECTED NOT DETECTED Final   Vibrio cholerae NOT DETECTED NOT DETECTED Final   Enteroaggregative E coli (EAEC) NOT DETECTED NOT DETECTED Final   Enteropathogenic E coli (EPEC) NOT DETECTED NOT DETECTED Final   Enterotoxigenic E coli (ETEC) NOT DETECTED NOT DETECTED Final   Shiga like toxin producing E coli (STEC) NOT DETECTED NOT DETECTED Final   Shigella/Enteroinvasive E coli (EIEC) NOT DETECTED NOT DETECTED Final   Cryptosporidium NOT  DETECTED NOT DETECTED Final   Cyclospora cayetanensis NOT DETECTED NOT DETECTED Final   Entamoeba histolytica NOT DETECTED NOT DETECTED Final   Giardia lamblia NOT DETECTED NOT DETECTED Final   Adenovirus F40/41 NOT DETECTED NOT DETECTED Final   Astrovirus NOT DETECTED NOT DETECTED Final   Norovirus GI/GII NOT DETECTED NOT DETECTED Final   Rotavirus A NOT DETECTED NOT DETECTED Final   Sapovirus (I, II, IV, and V) NOT DETECTED NOT DETECTED Final    Comment: Performed at [facility], [address] [place], [place] [address]     Labs: BNP (last 3 results) No results for input(s): "BNP" in the last 8760 hours. Basic Metabolic Panel: Recent Labs  Lab 01/14/24 0602 01/14/24 0743 01/15/24 0427 01/16/24 0446 01/17/24 0319  NA 134* 135 140 138 139  K 2.0* 2.2* 3.0* 3.4* 4.8  CL 98 105 109 107 105  CO2 22 21* 21* 23 25  GLUCOSE 119* 100* 95 89 84  BUN 32* 31* 20 16 16   CREATININE 1.88* 1.79* 1.36* 1.12* 1.12*  CALCIUM 9.1 7.9* 8.5* 8.1* 8.5*  MG  --  1.7 1.8 1.3* 1.4*   Liver Function Tests: Recent Labs  Lab 01/14/24 0602 01/14/24 0743 01/15/24 0427  AST 16 12* 15  ALT 13 11 11   ALKPHOS 124 102 108  BILITOT 0.6 0.6 0.7  PROT 7.3 6.1* 6.6  ALBUMIN 3.2* 2.7* 3.1*   Recent Labs  Lab 01/14/24 0602  LIPASE 46   No results for input(s): "AMMONIA" in the last 168 hours. CBC: Recent Labs  Lab 01/14/24 0602 01/15/24 0427 01/16/24 0446 01/17/24 0319  WBC 10.5 10.8* 8.1 9.3  NEUTROABS 8.5*  --   --   --   HGB 11.3* 10.3* 9.2* 9.3*  HCT 34.4* 32.9* 28.3* 29.4*  MCV 94.0 98.5 97.9 98.3  PLT 350 418* 355 389   Cardiac Enzymes: No results for input(s): "CKTOTAL", "CKMB", "CKMBINDEX", "TROPONINI" in the last 168 hours. BNP: Invalid input(s): "POCBNP" CBG: No results for input(s): "GLUCAP" in the last 168 hours. D-Dimer No results for input(s): "DDIMER" in the last 72 hours. Hgb A1c No results for input(s): "HGBA1C" in the last 72 hours. Lipid Profile No  results for input(s): "CHOL", "HDL", "LDLCALC", "TRIG", "CHOLHDL", "LDLDIRECT" in the last 72 hours. Thyroid function studies No results for input(s): "TSH", "T4TOTAL", "T3FREE", "THYROIDAB" in the last 72 hours.  Invalid input(s): "FREET3" Anemia  work up No results for input(s): "VITAMINB12", "FOLATE", "FERRITIN", "TIBC", "IRON", "RETICCTPCT" in the last 72 hours. Urinalysis    Component Value Date/Time   COLORURINE YELLOW 01/14/2024 0603   APPEARANCEUR CLEAR 01/14/2024 0603   LABSPEC 1.010 01/14/2024 0603   PHURINE 6.0 01/14/2024 0603   GLUCOSEU NEGATIVE 01/14/2024 0603   HGBUR SMALL (A) 01/14/2024 0603   BILIRUBINUR NEGATIVE 01/14/2024 0603   BILIRUBINUR negative 09/06/2018 1057   KETONESUR NEGATIVE 01/14/2024 0603   PROTEINUR 30 (A) 01/14/2024 0603   UROBILINOGEN 0.2 09/06/2018 1057   UROBILINOGEN 0.2 09/12/2015 1225   NITRITE NEGATIVE 01/14/2024 0603   LEUKOCYTESUR SMALL (A) 01/14/2024 0603   Sepsis Labs Recent Labs  Lab 01/14/24 0602 01/15/24 0427 01/16/24 0446 01/17/24 0319  WBC 10.5 10.8* 8.1 9.3   Microbiology Recent Results (from the past 240 hours)  Resp panel by RT-PCR (RSV, Flu A&B, Covid) Anterior Nasal Swab     Status: None   Collection Time: 01/14/24  5:15 AM   Specimen: Anterior Nasal Swab  Result Value Ref Range Status   SARS Coronavirus 2 by RT PCR NEGATIVE NEGATIVE Final    Comment: (NOTE) SARS-CoV-2 target nucleic acids are NOT DETECTED.  The SARS-CoV-2 RNA is generally detectable in upper respiratory specimens during the acute phase of infection. The lowest concentration of SARS-CoV-2 viral copies this assay can detect is 138 copies/mL. A negative result does not preclude SARS-Cov-2 infection and should not be used as the sole basis for treatment or other patient management decisions. A negative result may occur with  improper specimen collection/handling, submission of specimen other than nasopharyngeal swab, presence of viral mutation(s)  within the areas targeted by this assay, and inadequate number of viral copies(<138 copies/mL). A negative result must be combined with clinical observations, patient history, and epidemiological information. The expected result is Negative.  Fact Sheet for Patients:  BloggerCourse.com  Fact Sheet for Healthcare Providers:  SeriousBroker.it  This test is no t yet approved or cleared by the [place] FDA and  has been authorized for detection and/or diagnosis of SARS-CoV-2 by FDA under an Emergency Use Authorization (EUA). This EUA will remain  in effect (meaning this test can be used) for the duration of the COVID-19 declaration under Section 564(b)(1) of the Act, 21 U.S.C.section 360bbb-3(b)(1), unless the authorization is terminated  or revoked sooner.       Influenza A by PCR NEGATIVE NEGATIVE Final   Influenza B by PCR NEGATIVE NEGATIVE Final    Comment: (NOTE) The Xpert Xpress SARS-CoV-2/FLU/RSV plus assay is intended as an aid in the diagnosis of influenza from Nasopharyngeal swab specimens and should not be used as a sole basis for treatment. Nasal washings and aspirates are unacceptable for Xpert Xpress SARS-CoV-2/FLU/RSV testing.  Fact Sheet for Patients: BloggerCourse.com  Fact Sheet for Healthcare Providers: SeriousBroker.it  This test is not yet approved or cleared by the [place] FDA and has been authorized for detection and/or diagnosis of SARS-CoV-2 by FDA under an Emergency Use Authorization (EUA). This EUA will remain in effect (meaning this test can be used) for the duration of the COVID-19 declaration under Section 564(b)(1) of the Act, 21 U.S.C. section 360bbb-3(b)(1), unless the authorization is terminated or revoked.     Resp Syncytial Virus by PCR NEGATIVE NEGATIVE Final    Comment: (NOTE) Fact Sheet for  Patients: BloggerCourse.com  Fact Sheet for Healthcare Providers: SeriousBroker.it  This test is not yet approved or cleared by the [place] and has been authorized for  detection and/or diagnosis of SARS-CoV-2 by FDA under an Emergency Use Authorization (EUA). This EUA will remain in effect (meaning this test can be used) for the duration of the COVID-19 declaration under Section 564(b)(1) of the Act, 21 U.S.C. section 360bbb-3(b)(1), unless the authorization is terminated or revoked.  Performed at [facility] [address], [place], [place] [address]   MRSA Next Gen by PCR, Nasal     Status: None   Collection Time: 01/14/24  8:07 AM   Specimen: Nasal Mucosa; Nasal Swab  Result Value Ref Range Status   MRSA by PCR Next Gen NOT DETECTED NOT DETECTED Final    Comment: (NOTE) The GeneXpert MRSA Assay (FDA approved for NASAL specimens only), is one component of a comprehensive MRSA colonization surveillance program. It is not intended to diagnose MRSA infection nor to guide or monitor treatment for MRSA infections. Test performance is not FDA approved in patients less than 9 years old. Performed at [facility], [address], [place], [place] [address]   Gastrointestinal Panel by PCR , Stool     Status: None   Collection Time: 01/15/24 11:21 AM   Specimen: Stool  Result Value Ref Range Status   Campylobacter species NOT DETECTED NOT DETECTED Final   Plesimonas shigelloides NOT DETECTED NOT DETECTED Final   Salmonella species NOT DETECTED NOT DETECTED Final   Yersinia enterocolitica NOT DETECTED NOT DETECTED Final   Vibrio species NOT DETECTED NOT DETECTED Final   Vibrio cholerae NOT DETECTED NOT DETECTED Final   Enteroaggregative E coli (EAEC) NOT DETECTED NOT DETECTED Final   Enteropathogenic E coli (EPEC) NOT DETECTED NOT DETECTED Final   Enterotoxigenic E coli (ETEC) NOT DETECTED NOT DETECTED Final   Shiga like  toxin producing E coli (STEC) NOT DETECTED NOT DETECTED Final   Shigella/Enteroinvasive E coli (EIEC) NOT DETECTED NOT DETECTED Final   Cryptosporidium NOT DETECTED NOT DETECTED Final   Cyclospora cayetanensis NOT DETECTED NOT DETECTED Final   Entamoeba histolytica NOT DETECTED NOT DETECTED Final   Giardia lamblia NOT DETECTED NOT DETECTED Final   Adenovirus F40/41 NOT DETECTED NOT DETECTED Final   Astrovirus NOT DETECTED NOT DETECTED Final   Norovirus GI/GII NOT DETECTED NOT DETECTED Final   Rotavirus A NOT DETECTED NOT DETECTED Final   Sapovirus (I, II, IV, and V) NOT DETECTED NOT DETECTED Final    Comment: Performed at [facility], [address] [place], [place] [address]     Time coordinating discharge: 35 minutes  SIGNED:   Erick Blinks, DO [facility] 01/17/2024, 8:07 AM  If 7PM-7AM, please contact night-coverage [email or web address]

## 2024-01-18 ENCOUNTER — Telehealth: Payer: Self-pay | Admitting: *Deleted

## 2024-01-18 NOTE — Transitions of Care (Post Inpatient/ED Visit) (Signed)
 01/18/2024  Name: Erin [name] MRN: [id number] DOB: [birth year]  Today's TOC FU Call Status: Today's TOC FU Call Status:: Successful TOC FU Call Completed TOC FU Call Complete Date: 01/18/24 Patient's Name and Date of Birth confirmed.  Transition Care Management Follow-up Telephone Call Date of Discharge: 01/17/24 [facility] Type of Discharge: Inpatient Admission Primary Inpatient Discharge Diagnosis:: AKI (acute kidney injury) How have you been since you were released from the hospital?: Better (pt states "feeling better, no nausea, no diarrhea"   eating and drinking well, ambulating without difficulty) Any questions or concerns?: No  Items Reviewed: Did you receive and understand the discharge instructions provided?: Yes Medications obtained,verified, and reconciled?: Yes (Medications Reviewed) Any new allergies since your discharge?: No Dietary orders reviewed?: Yes Type of Diet Ordered:: low sodium, heart healthy Do you have support at home?: Yes People in Home: spouse Name of Support/Comfort Primary Source: Clancy Leiner Patient declined enrollment in [facility] 30 day program Reviewed importance of drinking adequate fluids, incorporating water daily  Medications Reviewed Today: Medications Reviewed Today     Reviewed by Audrie Gallus, RN (Registered Nurse) on 01/18/24 at 1127  Med List Status: <None>   Medication Order Taking? Sig Documenting Provider Last Dose Status Informant  acetaminophen (TYLENOL) 500 MG tablet [id number] Yes Take 1,000 mg by mouth daily as needed for headache (pain).  [provider] Taking Active Self, Spouse/Significant Other, Pharmacy Records  acyclovir ointment (ZOVIRAX) 5 % [id number] No Apply 1 Application topically every 3 (three) hours as needed (fever blisters).  Patient not taking: Reported on 01/15/2024   Karie Georges, MD Not Taking Active Self, Spouse/Significant Other, Pharmacy Records  amLODipine  (NORVASC) 5 MG tablet [id number] Yes Take 1 tablet (5 mg total) by mouth daily. Beatrice Lecher, PA-C Taking Active Self, Spouse/Significant Other, Pharmacy Records  aspirin EC 81 MG tablet [id number] Yes Take 81 mg by mouth daily. [provider] Taking Active Self, Spouse/Significant Other, Pharmacy Records  Cholecalciferol (VITAMIN D) 2000 units tablet [id number] Yes Take 2,000 Units by mouth daily. [provider] Taking Active Self, Spouse/Significant Other, Pharmacy Records  clopidogrel (PLAVIX) 75 MG tablet [id number] Yes TAKE 1 TABLET BY MOUTH EVERY DAY Cephus Shelling, MD Taking Active Self, Spouse/Significant Other, Pharmacy Records  dicyclomine (BENTYL) 10 MG capsule [id number] No TAKE 1 CAPSULE BY MOUTH THREE TIMES A DAY BEFORE MEALS  Patient not taking: No sig reported   Meryl Dare, MD Not Taking Active Self, Spouse/Significant Other, Pharmacy Records  DULoxetine (CYMBALTA) 20 MG capsule [id number] Yes TAKE 2-3 CAPSULES BY MOUTH EVERY DAY Karie Georges, MD Taking Active Self, Spouse/Significant Other, Pharmacy Records  fluticasone [facility]) 50 MCG/ACT nasal spray [id number] Yes INSTILL 1 SPRAY INTO BOTH NOSTRILS DAILY Karie Georges, MD Taking Active Self, Spouse/Significant Other, Pharmacy Records  gabapentin (NEURONTIN) 600 MG tablet [id number] Yes TAKE 1 TABLET BY MOUTH THREE TIMES A DAY Karie Georges, MD Taking Active Self, Spouse/Significant Other, Pharmacy Records  KLOR-CON M20 20 MEQ tablet [id number] Yes TAKE 1 TABLET BY MOUTH DAILY AS NEEDED (FOR FLUID OR EDEMA). Karie Georges, MD Taking Active Self, Spouse/Significant Other, Pharmacy Records  levothyroxine (SYNTHROID) 75 MCG tablet [id number] Yes Take 1 tablet (75 mcg total) by mouth daily before breakfast. TAKE 1 TABLETBY MOUTH DAILY BEFORE BREAKFAST MONDAY-FRIDAY, THEN 1/2 SATURDAY AND SUNDAY Beatrice Lecher, PA-C Taking Active Self, Spouse/Significant Other, Pharmacy Records  meclizine  (ANTIVERT) 25 MG tablet [id number] Yes TAKE 1 TABLET BY  MOUTH THREE TIMES A DAY AS NEEDED FOR DIZZINESS Karie Georges, MD Taking Active Self, Spouse/Significant Other, Pharmacy Records  metoprolol succinate (TOPROL-XL) 25 MG 24 hr tablet [id number] Yes TAKE 1 TABLET BY MOUTH TWICE A Su Monks, MD Taking Active Self, Spouse/Significant Other, Pharmacy Records  nitroGLYCERIN (NITROSTAT) 0.4 MG SL tablet [id number]  Place 1 tablet (0.4 mg total) under the tongue every 5 (five) minutes as needed for chest pain. Yvonne Kendall, MD  Expired 01/15/24 2359 Self, Spouse/Significant Other, Pharmacy Records           Med Note REMONIA, OTTE   Mon Jan 15, 2024 12:25 PM) Hasn't ever taken them . Keeps if needed  nystatin-triamcinolone (MYCOLOG II) cream [id number] Yes Apply 1 application topically 2 (two) times daily as needed (dry skin).  [provider] Taking Active Self, Spouse/Significant Other, Pharmacy Records           Med Note Sueanne Margarita Aug 01, 2017 12:05 PM)    pantoprazole (PROTONIX) 40 MG tablet [id number] Yes TAKE 1 TABLET BY MOUTH TWICE A DAY Meryl Dare, MD Taking Active Self, Spouse/Significant Other, Pharmacy Records  rOPINIRole (REQUIP) 0.5 MG tablet [id number] Yes Take 1 tablet (0.5 mg total) by mouth at bedtime. May Increase to 2 tablet at bedtime after 7 days if needed. Karie Georges, MD Taking Active Self, Spouse/Significant Other, Pharmacy Records  rosuvastatin (CRESTOR) 40 MG tablet [id number] Yes TAKE 1 TABLET BY MOUTH EVERY DAY Olene Floss, RPH-CPP Taking Active Self, Spouse/Significant Other, Pharmacy Records  tiZANidine (ZANAFLEX) 4 MG tablet [id number] Yes Take 2 mg by mouth as needed for muscle spasms. [provider] Taking Active Self, Spouse/Significant Other, Pharmacy Records  vitamin B-12 1000 MCG tablet [id number] Yes Take 1 tablet (1,000 mcg total) by mouth daily. Dhungel, Nishant, MD Taking Active Self,  Spouse/Significant Other, Pharmacy Records            Home Care and Equipment/Supplies: Were Home Health Services Ordered?: No Any new equipment or medical supplies ordered?: No  Functional Questionnaire: Do you need assistance with bathing/showering or dressing?: No Do you need assistance with meal preparation?: No Do you need assistance with eating?: No Do you have difficulty maintaining continence: No Do you need assistance with getting out of bed/getting out of a chair/moving?: No Do you have difficulty managing or taking your medications?: No  Follow up appointments reviewed: PCP Follow-up appointment confirmed?: Yes Date of PCP follow-up appointment?: 01/23/24 Follow-up Provider: Shirline Frees NP   @ 145 pm      collaborated with care guide, appointment made, pt agreed to see different provider to get earlier appointment Specialist Hospital Follow-up appointment confirmed?: NA Do you need transportation to your follow-up appointment?: No Do you understand care options if your condition(s) worsen?: Yes-patient verbalized understanding  SDOH Interventions Today    Flowsheet Row Most Recent Value  SDOH Interventions   Food Insecurity Interventions Intervention Not Indicated  Housing Interventions Intervention Not Indicated  Transportation Interventions Intervention Not Indicated  Utilities Interventions Intervention Not Indicated       Irving Shows [facility], BSN RN Care Manager/ Transition of Care [place]/ [facility] Population Health [phone]

## 2024-01-19 ENCOUNTER — Other Ambulatory Visit: Payer: Self-pay | Admitting: Family

## 2024-01-19 ENCOUNTER — Other Ambulatory Visit: Payer: Self-pay | Admitting: Family Medicine

## 2024-01-19 DIAGNOSIS — J309 Allergic rhinitis, unspecified: Secondary | ICD-10-CM

## 2024-01-19 DIAGNOSIS — I739 Peripheral vascular disease, unspecified: Secondary | ICD-10-CM

## 2024-01-23 ENCOUNTER — Encounter: Payer: Self-pay | Admitting: Adult Health

## 2024-01-23 ENCOUNTER — Ambulatory Visit: Payer: Medicare Other | Admitting: Adult Health

## 2024-01-23 VITALS — BP 100/60 | HR 64 | Temp 97.9°F | Ht 63.0 in | Wt 105.0 lb

## 2024-01-23 DIAGNOSIS — N179 Acute kidney failure, unspecified: Secondary | ICD-10-CM

## 2024-01-23 DIAGNOSIS — K219 Gastro-esophageal reflux disease without esophagitis: Secondary | ICD-10-CM

## 2024-01-23 DIAGNOSIS — E7800 Pure hypercholesterolemia, unspecified: Secondary | ICD-10-CM | POA: Diagnosis not present

## 2024-01-23 DIAGNOSIS — E871 Hypo-osmolality and hyponatremia: Secondary | ICD-10-CM

## 2024-01-23 DIAGNOSIS — K551 Chronic vascular disorders of intestine: Secondary | ICD-10-CM

## 2024-01-23 DIAGNOSIS — F419 Anxiety disorder, unspecified: Secondary | ICD-10-CM

## 2024-01-23 DIAGNOSIS — R112 Nausea with vomiting, unspecified: Secondary | ICD-10-CM | POA: Diagnosis not present

## 2024-01-23 DIAGNOSIS — E860 Dehydration: Secondary | ICD-10-CM | POA: Diagnosis not present

## 2024-01-23 DIAGNOSIS — E039 Hypothyroidism, unspecified: Secondary | ICD-10-CM

## 2024-01-23 DIAGNOSIS — D649 Anemia, unspecified: Secondary | ICD-10-CM

## 2024-01-23 DIAGNOSIS — E876 Hypokalemia: Secondary | ICD-10-CM | POA: Diagnosis not present

## 2024-01-23 DIAGNOSIS — R519 Headache, unspecified: Secondary | ICD-10-CM

## 2024-01-23 DIAGNOSIS — I10 Essential (primary) hypertension: Secondary | ICD-10-CM

## 2024-01-23 DIAGNOSIS — I739 Peripheral vascular disease, unspecified: Secondary | ICD-10-CM

## 2024-01-23 DIAGNOSIS — F32A Depression, unspecified: Secondary | ICD-10-CM

## 2024-01-23 DIAGNOSIS — G63 Polyneuropathy in diseases classified elsewhere: Secondary | ICD-10-CM

## 2024-01-23 LAB — BASIC METABOLIC PANEL
BUN: 17 mg/dL (ref 6–23)
CO2: 28 meq/L (ref 19–32)
Calcium: 9.3 mg/dL (ref 8.4–10.5)
Chloride: 100 meq/L (ref 96–112)
Creatinine, Ser: 1.06 mg/dL (ref 0.40–1.20)
GFR: 54.4 mL/min — ABNORMAL LOW (ref >60.00)
Glucose, Bld: 88 mg/dL (ref 70–99)
Potassium: 4.4 meq/L (ref 3.5–5.1)
Sodium: 137 meq/L (ref 135–145)

## 2024-01-23 LAB — CBC
HCT: 33.1 % — ABNORMAL LOW (ref 36.0–46.0)
Hemoglobin: 11 g/dL — ABNORMAL LOW (ref 12.0–15.0)
MCHC: 33.3 g/dL (ref 30.0–36.0)
MCV: 97.6 fl (ref 78.0–100.0)
Platelets: 451 10*3/uL — ABNORMAL HIGH (ref 150.0–400.0)
RBC: 3.39 Mil/uL — ABNORMAL LOW (ref 3.87–5.11)
RDW: 13.5 % (ref 11.5–15.5)
WBC: 9.3 10*3/uL (ref 4.0–10.5)

## 2024-01-23 LAB — MAGNESIUM: Magnesium: 1.5 mg/dL (ref 1.5–2.5)

## 2024-01-23 MED ORDER — ONDANSETRON HCL 8 MG PO TABS: 8.0000 mg | ORAL_TABLET | Freq: Three times a day (TID) | ORAL | 1 refills | Status: DC | PRN

## 2024-01-23 NOTE — Progress Notes (Signed)
 Subjective:    Patient ID: Erin [name], female    DOB: Jan 07, 1956, 68 y.o.   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), Anxiety, Aortoiliac occlusive disease (HCC) (08/14/2017), B12 deficiency anemia (09/14/2015), Blood transfusion without reported diagnosis, BPPV (benign paroxysmal positional vertigo) (07/08/2015), Cardiomyopathy (HCC) (11/03/2017), Chronic back pain, Chronic daily headache (03/29/2013), Closed right hip fracture (HCC) (09/10/2015), Coronary artery disease involving native coronary artery of native heart without angina pectoris (10/13/2017), Depression, Essential (hemorrhagic) thrombocythemia (HCC) (09/06/2018), Essential hypertension (09/25/2023), Family history of early CAD (04/08/2016), GERD (gastroesophageal reflux disease), History of hiatal hernia, Hot flashes, Hyperlipidemia, Hyperlipidemia associated with type 2 diabetes mellitus (HCC) (05/11/2017), Hypothyroidism (09/10/2015), IBS (irritable bowel syndrome) (03/29/2013), Neck pain (07/08/2015), 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   She is a patient of Dr. Casimiro Needle who I am seeing today for TCM visit. She presents to the office today with her husband   Admit Date 01/14/2024 Discharge Date 01/17/2024  Presented to the emergency room with 1 week onset of nausea, vomiting, and diarrhea with poor oral intake.  She denied any fevers, chills, significant abdominal pain aside from cramping.  Her labs in the ER showed a hemoglobin of 11.3 and potassium of 2, creatinine 1.88 with a sodium of 134.  She was started on IV fluids as well as potassium supplementation and admitted to the hospital.  Hospital Course  AKI secondary to dehydration  -  from possible gastroenteritis  Severe hypokalemia  -Potassium was replenished and was 4.8 upon discharge -  Mild Hyponatremia  -The setting of dehydration. -Was trending up prior to discharge   Mild anemia  - no signs of overt bleeding.  - Continue to monitor   PVD/Chronic mesenteric ischemia/dyslipidemia -Balloon angioplasty of SMA and celiac artery in April 2021 as well as SMA and celiac artery stenting in July 2019.  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  She had not had any diarrhea or vomiting since being discharged from the hospital until yesterday when she had an episode of each.  She has not had neither today.  She has not had any fevers or chills.  Her husband reports that she has not been staying hydrated and less is with a Coca-Cola or other type of soda.  She has been trying to eat but becomes nauseous when doing so.  Review of Systems  Constitutional:  Positive for fatigue.  HENT: Negative.    Eyes: Negative.   Respiratory: Negative.    Cardiovascular: Negative.   Gastrointestinal:  Positive for diarrhea, nausea and vomiting.  Endocrine: Negative.   Genitourinary: Negative.   Musculoskeletal: Negative.   Skin: Negative.   Allergic/Immunologic: Negative.   Neurological: Negative.   Hematological: Negative.   Psychiatric/Behavioral: Negative.     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 60.4Gy/62fx and chemo   Anxiety     Aortoiliac occlusive disease (HCC) 08/14/2017   B12 deficiency anemia 09/14/2015   Blood transfusion without reported diagnosis    BPPV (benign paroxysmal positional vertigo) 07/08/2015   Cardiomyopathy (HCC) 11/03/2017   Chronic back pain    Chronic daily headache 03/29/2013   takes bc powder   Closed right hip fracture (HCC) 09/10/2015   Coronary artery disease involving native coronary artery of native heart without angina pectoris 10/13/2017   DES to mid RCA   Depression    Essential (hemorrhagic) thrombocythemia (HCC) 09/06/2018   Essential hypertension 09/25/2023   Family history of early CAD 04/08/2016   GERD (gastroesophageal reflux disease)    History of hiatal hernia    Hot flashes    Hyperlipidemia    Hyperlipidemia associated with type 2 diabetes mellitus (HCC) 05/11/2017   Hypothyroidism 09/10/2015   IBS (irritable bowel syndrome) 03/29/2013   Neck pain 07/08/2015   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   takes neurotin   Peripheral vascular disease (HCC) 11/03/2017   QT prolongation    Sacroiliitis (HCC) 09/06/2018   Spasms of the hands or feet 10/08/2017   Syncope 06/07/2016   Tubular adenoma of colon 09/08/2003   Vertigo    Wears glasses     Social History   Socioeconomic History   Marital status: Married    Spouse name: Not on file   Number of children: 0   Years of education: Not on file   Highest education level: Not on file  Occupational History   Occupation: caregiver  Tobacco Use   Smoking status: Former    Current packs/day: 0.00    Types: Cigarettes    Quit date: 10/07/2014    Years since quitting: 9.3   Smokeless tobacco: Never  Vaping Use   Vaping status: Never Used  Substance and Sexual Activity   Alcohol use: No    Alcohol/week: 0.0 standard drinks of alcohol   Drug use: No   Sexual activity: Yes    Partners: Male  Other Topics Concern   Not on file  Social History Narrative   Work or School: homemaker      Home Situation: lives with her  husband,[name] takes care of her elderly parents       Spiritual Beliefs: Christian      Lifestyle: no regular exercise, poor diet      Right handed             Social Drivers of Health   Financial Resource Strain: Low Risk  (09/12/2022)   Overall Financial Resource Strain (CARDIA)    Difficulty of Paying Living Expenses: Not hard at all  Food Insecurity: No Food Insecurity (01/18/2024)   Hunger Vital Sign    Worried About Running Out of Food in the Last Year: Never true    Ran Out of Food in the Last Year: Never true  Transportation Needs: No Transportation Needs (01/18/2024)   PRAPARE - Administrator, Civil Service (Medical): No    Lack of Transportation (Non-Medical): No  Physical Activity: Inactive (09/12/2022)   Exercise Vital Sign    Days of Exercise per Week: 0 days    Minutes of Exercise per Session: 0 min  Stress: No Stress Concern Present (09/12/2022)   [employer] of Occupational Health - Occupational Stress Questionnaire    Feeling of Stress : Not at all  Social Connections: Socially Integrated (  01/14/2024)   Social Connection and Isolation Panel [NHANES]    Frequency of Communication with Friends and Family: Once a week    Frequency of Social Gatherings with Friends and Family: More than three times a week    Attends Religious Services: More than 4 times per year    Active Member of Clubs or Organizations: Yes    Attends Banker Meetings: More than 4 times per year    Marital Status: Married  Catering manager Violence: Not At Risk (01/18/2024)   Humiliation, Afraid, Rape, and Kick questionnaire    Fear of Current or Ex-Partner: No    Emotionally Abused: No    Physically Abused: No    Sexually Abused: No    Past Surgical History:  Procedure Laterality Date   ABDOMINAL AORTOGRAM N/A 08/02/2017   Procedure: ABDOMINAL AORTOGRAM;  Surgeon: Iran Ouch, MD;  Location: [facility];  Service: Cardiovascular;  Laterality:  N/A;   AORTA - BILATERAL FEMORAL ARTERY BYPASS GRAFT N/A 08/14/2017   Procedure: AORTA BIFEMORAL BYPASS GRAFT;  Surgeon: Larina Earthly, MD;  Location: [facility] OR;  Service: Vascular;  Laterality: N/A;   COLONOSCOPY     CORONARY STENT INTERVENTION N/A 10/13/2017   Procedure: CORONARY STENT INTERVENTION;  Surgeon: Yvonne Kendall, MD;  Location: [facility];  Service: Cardiovascular;  Laterality: N/A;   dental implant     ECTOPIC PREGNANCY SURGERY     EMBOLECTOMY N/A 08/14/2017   Procedure: EMBOLECTOMY FEMORAL;  Surgeon: Larina Earthly, MD;  Location: [facility] OR;  Service: Vascular;  Laterality: N/A;   FEMORAL-POPLITEAL BYPASS GRAFT Right 08/14/2017   Procedure: Right Femoral to Above Knee Popliteal Bypass Graft using Non-Reversed Greater Saphenous Vein Graft from Right Leg;  Surgeon: Larina Earthly, MD;  Location: [facility] OR;  Service: Vascular;  Laterality: Right;   FLEXIBLE SIGMOIDOSCOPY N/A 08/14/2013   Procedure: FLEXIBLE SIGMOIDOSCOPY;  Surgeon: Meryl Dare, MD;  Location: [facility];  Service: Endoscopy;  Laterality: N/A;   LEFT HEART CATH AND CORONARY ANGIOGRAPHY N/A 10/13/2017   Procedure: LEFT HEART CATH AND CORONARY ANGIOGRAPHY;  Surgeon: Yvonne Kendall, MD;  [facility]: [facility];  Service: Cardiovascular;  Laterality: N/A;   LOWER EXTREMITY ANGIOGRAPHY Bilateral 08/02/2017   Procedure: Lower Extremity Angiography;  Surgeon: Iran Ouch, MD;  [facility]: [facility] INVASIVE CV LAB;  Service: Cardiovascular;  Laterality: Bilateral;   MULTIPLE TOOTH EXTRACTIONS     PERIPHERAL VASCULAR INTERVENTION  05/22/2018   Procedure: PERIPHERAL VASCULAR INTERVENTION;  Surgeon: Nada Libman, MD;  [facility]: [facility];  Service: Cardiovascular;;  SMA and Celiac   PILONIDAL CYST EXCISION     POLYPECTOMY     THROMBECTOMY FEMORAL ARTERY Right 08/14/2017   Procedure: THROMBECTOMY FEMORAL ARTERY;  Surgeon: Larina Earthly, MD;  Location: [facility] OR;  Service: Vascular;  Laterality: Right;    TONSILLECTOMY     TOTAL HIP ARTHROPLASTY  09/11/2015   Procedure: TOTAL HIP ARTHROPLASTY;  Surgeon: Sheral Apley, MD;  Location: [facility] OR;  Service: Orthopedics;;   ULTRASOUND GUIDANCE FOR VASCULAR ACCESS  10/13/2017   Procedure: Ultrasound Guidance For Vascular Access;  Surgeon: Yvonne Kendall, MD;  Location: [facility];  Service: Cardiovascular;;   VISCERAL ANGIOGRAPHY N/A 03/03/2020   Procedure: MESTENRIC ANGIOGRAPHY;  Surgeon: Nada Libman, MD;  [facility]: [facility];  Service: Cardiovascular;  Laterality: N/A;    Family History  Problem Relation Age of Onset   Arthritis Mother    Hyperlipidemia  Mother    Heart disease Mother    Hypertension Mother    Stroke Mother 53   Irritable bowel syndrome Mother    Thyroid disease Mother    Heart attack Mother    Heart disease Father    Hyperlipidemia Father    Hypertension Father    Stroke Father 32   Thyroid disease Father    Prostate cancer Father    Heart attack Father    Lung cancer Brother 43   Heart attack Maternal Grandfather    Heart attack Maternal Uncle    Colon cancer Neg Hx    Rectal cancer Neg Hx    Stomach cancer Neg Hx    Esophageal cancer Neg Hx     Allergies  Allergen Reactions   Amitiza [Lubiprostone] Diarrhea    Uncontrollable diarrhea   Nortriptyline Other (See Comments)    Stomach distention   Sulfa Antibiotics Nausea And Vomiting and Other (See Comments)    GI distress/pain   Tramadol Itching and Rash   Atorvastatin Nausea And Vomiting   Claritin [Loratadine] Other (See Comments)    Hot flashes    Current Outpatient Medications on File Prior to Visit  Medication Sig Dispense Refill   acetaminophen (TYLENOL) 500 MG tablet Take 1,000 mg by mouth daily as needed for headache (pain).      acyclovir ointment (ZOVIRAX) 5 % Apply 1 Application topically every 3 (three) hours as needed (fever blisters). 15 g 3   amLODipine (NORVASC) 5 MG tablet Take 1 tablet (5 mg total) by mouth  daily. 90 tablet 3   aspirin EC 81 MG tablet Take 81 mg by mouth daily.     Cholecalciferol (VITAMIN D) 2000 units tablet Take 2,000 Units by mouth daily.     clopidogrel (PLAVIX) 75 MG tablet TAKE 1 TABLET BY MOUTH EVERY DAY 90 tablet 1   dicyclomine (BENTYL) 10 MG capsule TAKE 1 CAPSULE BY MOUTH THREE TIMES A DAY BEFORE MEALS 270 capsule 0   DULoxetine (CYMBALTA) 20 MG capsule TAKE 2-3 CAPSULES BY MOUTH EVERY DAY 270 capsule 1   fluticasone (FLONASE) 50 MCG/ACT nasal spray SPRAY 1 SPRAY INTO EACH NOSTRIL DAILY 32 mL 1   gabapentin (NEURONTIN) 600 MG tablet TAKE 1 TABLET BY MOUTH THREE TIMES A DAY 90 tablet 3   levothyroxine (SYNTHROID) 75 MCG tablet Take 1 tablet (75 mcg total) by mouth daily before breakfast. TAKE 1 TABLETBY MOUTH DAILY BEFORE BREAKFAST MONDAY-FRIDAY, THEN 1/2 SATURDAY AND SUNDAY     meclizine (ANTIVERT) 25 MG tablet TAKE 1 TABLET BY MOUTH THREE TIMES A DAY AS NEEDED FOR DIZZINESS 90 tablet 1   metoprolol succinate (TOPROL-XL) 25 MG 24 hr tablet TAKE 1 TABLET BY MOUTH TWICE A DAY 180 tablet 1   nystatin-triamcinolone (MYCOLOG II) cream Apply 1 application topically 2 (two) times daily as needed (dry skin).   2   pantoprazole (PROTONIX) 40 MG tablet TAKE 1 TABLET BY MOUTH TWICE A DAY 180 tablet 0   potassium chloride SA (KLOR-CON M20) 20 MEQ tablet TAKE 1 TABLET BY MOUTH DAILY AS NEEDED (FOR FLUID OR EDEMA). 90 tablet 1   rOPINIRole (REQUIP) 0.5 MG tablet Take 1 tablet (0.5 mg total) by mouth at bedtime. May Increase to 2 tablet at bedtime after 7 days if needed. 30 tablet 0   rosuvastatin (CRESTOR) 40 MG tablet TAKE 1 TABLET BY MOUTH EVERY DAY 90 tablet 3   tiZANidine (ZANAFLEX) 4 MG tablet Take 2 mg by mouth as needed for muscle  spasms.     vitamin B-12 1000 MCG tablet Take 1 tablet (1,000 mcg total) by mouth daily. 30 tablet 0   nitroGLYCERIN (NITROSTAT) 0.4 MG SL tablet Place 1 tablet (0.4 mg total) under the tongue every 5 (five) minutes as needed for chest pain. 100 tablet  1   No current facility-administered medications on file prior to visit.    BP 100/60   Pulse 64   Temp 97.9 F (36.6 C) (Oral)   Ht 5\' 3"  (1.6 m)   Wt 105 lb (47.6 kg)   SpO2 100%   BMI 18.60 kg/m       Objective:   Physical Exam Vitals and nursing note reviewed.  Constitutional:      Appearance: Normal appearance. She is obese. She is ill-appearing.  HENT:     Mouth/Throat:     Mouth: Mucous membranes are moist.  Cardiovascular:     Rate and Rhythm: Normal rate and regular rhythm.     Pulses: Normal pulses.     Heart sounds: Normal heart sounds.  Pulmonary:     Effort: Pulmonary effort is normal.     Breath sounds: Normal breath sounds.  Abdominal:     General: Abdomen is flat. Bowel sounds are normal.     Palpations: Abdomen is soft.  Skin:    General: Skin is warm and dry.  Neurological:     General: No focal deficit present.     Mental Status: She is alert and oriented to person, place, and time.  Psychiatric:        Mood and Affect: Mood normal.        Behavior: Behavior normal.        Thought Content: Thought content normal.        Judgment: Judgment normal.       Assessment & Plan:  1. AKI (acute kidney injury) (HCC) (Primary) -Reviewed hospital notes, labs, imaging, discharge instructions, and medication changes, if there were any.  All questions answered to the best of my ability  - CBC; Future - Basic Metabolic Panel; Future - Magnesium; Future - Magnesium - Basic Metabolic Panel - CBC  2. Dehydration --Was encouraged to hydrate with Gatorade or water, stay away from sodas at this time due to dehydration effects. - CBC; Future - Basic Metabolic Panel; Future - Magnesium; Future - Magnesium - Basic Metabolic Panel - CBC  3. Hypokalemia - Recheck labs today  - CBC; Future - Basic Metabolic Panel; Future - Magnesium; Future - Magnesium - Basic Metabolic Panel - CBC  4. Hyponatremia - Recheck labs today  - CBC; Future - Basic  Metabolic Panel; Future - Magnesium; Future - Magnesium - Basic Metabolic Panel - CBC  5. Anemia, unspecified type  - CBC; Future - Basic Metabolic Panel; Future - Basic Metabolic Panel - CBC  6. Mesenteric artery stenosis (HCC) - Continue statin and ASa  7. Pure hypercholesterolemia - Continue statin   8. PVD (peripheral vascular disease) (HCC)   9. GERD without esophagitis - Continue PPI  10. Hypothyroidism, unspecified type - Continue with synthroid   11. 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  I will send a message to her EP about this.  12. Essential hypertension - well controlled. No change in medication  - CBC; Future - Basic Metabolic Panel; Future - Basic Metabolic Panel - CBC  13. Chronic daily headache - Continue with Tylenol  14. Anxiety and depression - Continue with Cymbalta   15. Nausea and vomiting, unspecified vomiting type -Send in Zofran 8 mg tablets.  She was encouraged to eat a bland diet and stay hydrated.  Follow-up with PCP if nausea, vomiting, and diarrhea continue. - CBC; Future - Basic Metabolic Panel; Future - Magnesium; Future - ondansetron (ZOFRAN) 8 MG tablet; Take 1 tablet (8 mg total) by mouth every 8 (eight) hours as needed for nausea or vomiting.  Dispense: 20 tablet; Refill: 1 - Magnesium - Basic Metabolic Panel - CBC  Shirline Frees, NP

## 2024-01-23 NOTE — Patient Instructions (Signed)
 It was great seeing you today   We will follow up with you regarding your lab work   Please let me know if you need anything

## 2024-01-26 ENCOUNTER — Other Ambulatory Visit: Payer: Self-pay | Admitting: Family Medicine

## 2024-01-26 ENCOUNTER — Telehealth: Payer: Self-pay

## 2024-01-26 DIAGNOSIS — G2581 Restless legs syndrome: Secondary | ICD-10-CM

## 2024-01-26 MED ORDER — ROPINIROLE HCL 1 MG PO TABS: 1.0000 mg | ORAL_TABLET | Freq: Every day | ORAL | 1 refills | Status: DC

## 2024-01-26 NOTE — Telephone Encounter (Signed)
 Copied from CRM [id number]. Topic: Clinical - Medication Question >> Jan 26, 2024 10:02 AM Chantha C wrote: Reason for CRM: Patient needs rOPINIRole (REQUIP) 0.5 MG tablet to be increase is not working, she's taken 2 pills it helps better, but now running out of medication. Patient is asking if the medication strength be increased? Patient is out of medication and would like the medicaion sent today, cannot sleep without the medication. please. Please advise and call back [phone].   [id number] - [place] [place] - [address] AT [place] [address] Phone:[phone]:[phone]

## 2024-01-26 NOTE — Addendum Note (Signed)
 Addended by: Karie Georges on: 01/26/2024 11:08 AM   Modules accepted: Orders

## 2024-01-26 NOTE — Telephone Encounter (Signed)
 Copied from CRM [id number]. Topic: Clinical - Medication Refill >> Jan 26, 2024  3:39 PM Truddie Crumble wrote: Most Recent Primary Care Visit:  Provider: Shirline Frees  Department: [facility]  Visit Type: HOSPITAL FOLLOW UP  Date: 01/23/2024  Medication: levothyroxine (SYNTHROID) 75 MCG tablet  Has the patient contacted their pharmacy? Yes (Agent: If no, request that the patient contact the pharmacy for the refill. If patient does not wish to contact the pharmacy document the reason why and proceed with request.) (Agent: If yes, when and what did the pharmacy advise?)  Is this the correct pharmacy for this prescription? Yes If no, delete pharmacy and type the correct one.  This is the patient's preferred pharmacy:  [id number] - [place], [place] - [address] [place] [address] [facility] [address] [facility] [address] [place] [address] Phone: [phone] Fax: [phone]  Has the prescription been filled recently? No  Is the patient out of the medication? Yes  Has the patient been seen for an appointment in the last year OR does the patient have an upcoming appointment? Yes  Can we respond through MyChart? Yes  Agent: Please be advised that Rx refills may take up to 3 business days. We ask that you follow-up with your pharmacy. >> Jan 26, 2024  4:48 PM Nurse Misty Stanley C wrote: Already routed to the office.

## 2024-01-26 NOTE — Telephone Encounter (Signed)
 Script for the 1 mg tablest sent

## 2024-01-26 NOTE — Telephone Encounter (Signed)
 Copied from CRM [id number]. Topic: Clinical - Medication Refill >> Jan 26, 2024  3:39 PM Truddie Crumble wrote: Most Recent Primary Care Visit:  Provider: Shirline Frees  Department: [facility]  Visit Type: HOSPITAL FOLLOW UP  Date: 01/23/2024  Medication: levothyroxine (SYNTHROID) 75 MCG tablet  Has the patient contacted their pharmacy? Yes (Agent: If no, request that the patient contact the pharmacy for the refill. If patient does not wish to contact the pharmacy document the reason why and proceed with request.) (Agent: If yes, when and what did the pharmacy advise?)  Is this the correct pharmacy for this prescription? Yes If no, delete pharmacy and type the correct one.  This is the patient's preferred pharmacy:  [id number] - [place], [place] - [address] [facility] [address] AT [facility] [address] [place] [address] Phone: [phone] Fax: [phone]  Has the prescription been filled recently? No  Is the patient out of the medication? Yes  Has the patient been seen for an appointment in the last year OR does the patient have an upcoming appointment? Yes  Can we respond through MyChart? Yes  Agent: Please be advised that Rx refills may take up to 3 business days. We ask that you follow-up with your pharmacy.

## 2024-01-26 NOTE — Telephone Encounter (Signed)
 Patient informed of the message below.

## 2024-01-29 MED ORDER — LEVOTHYROXINE SODIUM 75 MCG PO TABS
75.0000 ug | ORAL_TABLET | Freq: Every day | ORAL | 0 refills | Status: DC
Start: 2024-01-29 — End: 2024-02-26

## 2024-01-29 NOTE — Telephone Encounter (Signed)
 Patient has an appt previously scheduled on 4/7.

## 2024-01-29 NOTE — Telephone Encounter (Signed)
 Pt is going to need an appt with me in may for follow up.

## 2024-02-17 ENCOUNTER — Other Ambulatory Visit: Payer: Self-pay | Admitting: Family Medicine

## 2024-02-17 DIAGNOSIS — J309 Allergic rhinitis, unspecified: Secondary | ICD-10-CM

## 2024-02-26 ENCOUNTER — Ambulatory Visit (INDEPENDENT_AMBULATORY_CARE_PROVIDER_SITE_OTHER): Payer: Medicare Other

## 2024-02-26 ENCOUNTER — Encounter: Payer: Self-pay | Admitting: Family Medicine

## 2024-02-26 ENCOUNTER — Ambulatory Visit: Payer: Medicare Other | Admitting: Family Medicine

## 2024-02-26 VITALS — BP 120/60 | HR 60 | Temp 98.0°F | Ht 63.0 in | Wt 110.0 lb

## 2024-02-26 DIAGNOSIS — Z1211 Encounter for screening for malignant neoplasm of colon: Secondary | ICD-10-CM

## 2024-02-26 DIAGNOSIS — Z0000 Encounter for general adult medical examination without abnormal findings: Secondary | ICD-10-CM

## 2024-02-26 DIAGNOSIS — E039 Hypothyroidism, unspecified: Secondary | ICD-10-CM

## 2024-02-26 DIAGNOSIS — Z1231 Encounter for screening mammogram for malignant neoplasm of breast: Secondary | ICD-10-CM

## 2024-02-26 DIAGNOSIS — I10 Essential (primary) hypertension: Secondary | ICD-10-CM | POA: Diagnosis not present

## 2024-02-26 DIAGNOSIS — Z13820 Encounter for screening for osteoporosis: Secondary | ICD-10-CM | POA: Diagnosis not present

## 2024-02-26 DIAGNOSIS — I739 Peripheral vascular disease, unspecified: Secondary | ICD-10-CM | POA: Diagnosis not present

## 2024-02-26 DIAGNOSIS — E7800 Pure hypercholesterolemia, unspecified: Secondary | ICD-10-CM | POA: Diagnosis not present

## 2024-02-26 DIAGNOSIS — G63 Polyneuropathy in diseases classified elsewhere: Secondary | ICD-10-CM | POA: Diagnosis not present

## 2024-02-26 MED ORDER — DULOXETINE HCL 20 MG PO CPEP: ORAL_CAPSULE | ORAL | 1 refills | Status: DC

## 2024-02-26 MED ORDER — ROSUVASTATIN CALCIUM 40 MG PO TABS: 40.0000 mg | ORAL_TABLET | Freq: Every day | ORAL | 1 refills | Status: DC

## 2024-02-26 MED ORDER — LEVOTHYROXINE SODIUM 75 MCG PO TABS: 75.0000 ug | ORAL_TABLET | Freq: Every day | ORAL | 1 refills | Status: DC

## 2024-02-26 NOTE — Progress Notes (Unsigned)
 Established Patient Office Visit  Subjective   Patient ID: Erin [name], female    DOB: 29-Mar-1956  Age: 68 y.o. MRN: [id number]  Chief Complaint  Patient presents with   Medical Management of Chronic Issues   Medication Problem    Patient states she has had problems with obtaining refills on Levothyroxine from the pharmacy and has not taken the Rx in the past month or so    Pt is here for follow up today, she reports that her father passed away last month and she ended up being admitted to the hospital due to severe dehydration, hypokalemia, AKI, etc. Her last set of labs were back in the normal range.  Husband is present in the visit today, he reports that she was in the bed for a long time even after she got out of the hospital. She reports she is eating much better, is trying to stay hydrated. Pt states she has been out of her levothyroxine for about a month, states that she hasn't been able to take it for that long.   Neuropathy/ restless leg-- pt reports good control of her symptoms with the ropinirole, duloxetine and the gabapentin, states she is taking these daily. I extensively reviewed her medication list and updated it. Needs refills today on the duloxetine.      Current Outpatient Medications  Medication Instructions   acetaminophen (TYLENOL) 1,000 mg, Daily PRN   acyclovir ointment (ZOVIRAX) 5 % 1 Application, Topical, Every  3 hours PRN   clopidogrel (PLAVIX) 75 mg, Oral, Daily   cyanocobalamin 1,000 mcg, Oral, Daily   DULoxetine (CYMBALTA) 20 MG capsule TAKE 2-3 CAPSULES BY MOUTH EVERY DAY   fluticasone (FLONASE) 50 MCG/ACT nasal spray SPRAY 1 SPRAY INTO EACH NOSTRIL DAILY   gabapentin (NEURONTIN) 600 mg, Oral, 3 times daily   levothyroxine (SYNTHROID) 75 mcg, Oral, Daily before breakfast, TAKE 1 TABLETBY MOUTH DAILY BEFORE BREAKFAST MONDAY-FRIDAY, THEN 1/2 SATURDAY AND SUNDAY   meclizine (ANTIVERT) 25 MG tablet TAKE 1 TABLET BY MOUTH THREE TIMES A DAY AS NEEDED  FOR DIZZINESS   metoprolol succinate (TOPROL-XL) 25 mg, Oral, 2 times daily   nitroGLYCERIN (NITROSTAT) 0.4 mg, Sublingual, Every 5 min PRN   nystatin-triamcinolone (MYCOLOG II) cream 1 application , 2 times daily PRN   ondansetron (ZOFRAN) 8 mg, Oral, Every 8 hours PRN   pantoprazole (PROTONIX) 40 mg, Oral, 2 times daily   potassium chloride SA (KLOR-CON M20) 20 MEQ tablet TAKE 1 TABLET BY MOUTH DAILY AS NEEDED (FOR FLUID OR EDEMA).   rOPINIRole (REQUIP) 1 mg, Oral, Daily at bedtime, May Increase to 2 tablet at bedtime after 7 days if needed.   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) 03/03/2020   Hyperlipidemia 02/12/2020   Cardiomyopathy (HCC) 11/03/2017   Peripheral vascular disease (HCC) 11/03/2017   Coronary artery disease involving native coronary artery of native heart without angina pectoris 10/13/2017   Aortoiliac occlusive disease (HCC) 08/14/2017   Hypokalemia 06/06/2016   Family history of early CAD 04/08/2016   Elevated glucose 03/18/2016  B12 deficiency anemia 09/14/2015   Hypothyroidism 09/10/2015   GERD without esophagitis 09/10/2015   BPPV (benign paroxysmal positional vertigo) 07/08/2015   Neck pain 07/08/2015   Anal cancer (HCC) 08/19/2013   IBS (irritable bowel syndrome) 03/29/2013   Chronic daily headache 03/29/2013   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      Review of Systems  All other systems reviewed and are negative.     Objective:     BP 120/60 Comment: vitals performed by Meriam Sprague, LPN during AWV--jaf   Pulse 60   Temp 98 F (36.7 C) (Oral)   Ht 5\' 3"  (1.6 m)   Wt 110 lb (49.9 kg)   SpO2 97%   BMI 19.49 kg/m    Physical Exam Vitals reviewed.  Constitutional:      Appearance: Normal appearance. She is well-groomed and normal weight.  Eyes:     Conjunctiva/sclera: Conjunctivae normal.  Neck:     Thyroid: No thyromegaly.  Cardiovascular:     Rate and Rhythm: Normal rate and regular rhythm.     Pulses: Normal pulses.     Heart sounds: S1 normal and S2 normal.  Pulmonary:     Effort: Pulmonary effort is normal.     Breath sounds: Normal breath sounds and air entry.  Abdominal:     General: Bowel sounds are normal.  Musculoskeletal:     Right lower leg: No edema.     Left lower leg: No edema.  Neurological:     Mental Status: She is alert and oriented to person, place, and time. Mental status is at baseline.     Gait: Gait is intact.  Psychiatric:        Mood and Affect: Mood and affect normal.        Speech: Speech normal.        Behavior: Behavior normal.        Judgment: Judgment normal.      No results found for any visits on 02/26/24.    The 10-year ASCVD risk score (Arnett DK, et al., 2019) is: 13.9%    Assessment & Plan:  Hypothyroidism, unspecified type Assessment & Plan: Refilled 75 mcg daily, will continue this for now and in 2 months she will need a repeat TSH for dosage adjustment. Orders placed  Orders: -     Levothyroxine Sodium; Take 1 tablet (75 mcg total) by mouth daily before breakfast. TAKE 1 TABLETBY MOUTH DAILY BEFORE BREAKFAST MONDAY-FRIDAY, THEN 1/2 SATURDAY AND SUNDAY  Dispense: 90 tablet; Refill: 1 -     TSH; Future  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 [facility]) Assessment & Plan: On cymbalta 20 mg 2 capsules daily ad gabapentin 3 times daily, patient reports her symptoms are well controlled on this medication, will continue as prescribed. Also on ropinirole for her restless leg syndrome, continue as prescribed, refills were sent  earlier last month.   Orders: -     DULoxetine HCl; TAKE 2-3 CAPSULES BY MOUTH EVERY DAY  Dispense: 270 capsule; Refill: 1  Pure hypercholesterolemia -     Rosuvastatin Calcium; Take 1 tablet (40 mg total) by mouth daily.  Dispense: 90 tablet; Refill: 1  Essential hypertension Assessment & Plan: Reviewed medications, BP is well controlled today on her current regimen, will continue as prescribed,see below. Current hypertension medications:       Sig   metoprolol succinate (TOPROL-XL) 25 MG 24 hr tablet (Taking) TAKE 1 TABLET BY MOUTH TWICE A DAY  Return in about 6 months (around 08/27/2024) for annual physical exam.    Karie Georges, MD

## 2024-02-26 NOTE — Patient Instructions (Addendum)
 [name] , Thank you for taking time to come for your Medicare Wellness Visit. I appreciate your ongoing commitment to your health goals. Please review the following plan we discussed and let me know if I can assist you in the future.   Referrals/Orders/Follow-Ups/Clinician Recommendations:   This is a list of the screening recommended for you and due dates:  Health Maintenance  Topic Date Due   DTaP/Tdap/Td vaccine (1 - Tdap) Never done   Zoster (Shingles) Vaccine (1 of 2) Never done   Eye exam for diabetics  01/20/2020   COVID-19 Vaccine (3 - Moderna risk series) 02/26/2020   Mammogram  08/08/2020   Yearly kidney health urinalysis for diabetes  02/11/2021   DEXA scan (bone density measurement)  Never done   Hemoglobin A1C  06/09/2022   Colon Cancer Screening  06/23/2023   Flu Shot  06/21/2024   Yearly kidney function blood test for diabetes  01/22/2025   Medicare Annual Wellness Visit  02/25/2025   Pneumonia Vaccine  Completed   Hepatitis C Screening  Addressed   HPV Vaccine  Aged Out   Complete foot exam   Discontinued    Advanced directives: (Declined) Advance directive discussed with you today. Even though you declined this today, please call our office should you change your mind, and we can give you the proper paperwork for you to fill out.  Next Medicare Annual Wellness Visit scheduled for next year: Yes

## 2024-02-26 NOTE — Progress Notes (Signed)
 Subjective:   ZYRA PARRILLO is a 68 y.o. who presents for a Medicare Wellness preventive visit.  Visit Complete: In person    Persons Participating in Visit: Patient.  AWV Questionnaire: No: Patient Medicare AWV questionnaire was not completed prior to this visit.  Cardiac Risk Factors include: advanced age (>49men, >62 women);hypertension     Objective:    Today's Vitals   02/26/24 1502  BP: 120/60  Pulse: 60  Temp: 98 F (36.7 C)  TempSrc: Oral  SpO2: 97%  Weight: 110 lb (49.9 kg)  Height: 5\' 3"  (1.6 m)   Body mass index is 19.49 kg/m.     02/26/2024    3:29 PM 01/14/2024    2:11 PM 01/14/2024    5:14 AM 09/12/2022   11:33 AM 03/03/2020    8:53 AM 02/25/2020   11:26 AM 12/16/2019    9:26 AM  Advanced Directives  Does Patient Have a Medical Advance Directive? No  No No No No No  Would patient like information on creating a medical advance directive? No - Patient declined No - Patient declined  No - Patient declined Yes (MAU/Ambulatory/Procedural Areas - Information given) No - Patient declined     Current Medications (verified) Outpatient Encounter Medications as of 02/26/2024  Medication Sig   acetaminophen (TYLENOL) 500 MG tablet Take 1,000 mg by mouth daily as needed for headache (pain).    acyclovir ointment (ZOVIRAX) 5 % Apply 1 Application topically every 3 (three) hours as needed (fever blisters).   amLODipine (NORVASC) 5 MG tablet Take 1 tablet (5 mg total) by mouth daily.   aspirin EC 81 MG tablet Take 81 mg by mouth daily.   Cholecalciferol (VITAMIN D) 2000 units tablet Take 2,000 Units by mouth daily.   clopidogrel (PLAVIX) 75 MG tablet TAKE 1 TABLET BY MOUTH EVERY DAY   dicyclomine (BENTYL) 10 MG capsule TAKE 1 CAPSULE BY MOUTH THREE TIMES A DAY BEFORE MEALS (Patient not taking: Reported on 02/26/2024)   DULoxetine (CYMBALTA) 20 MG capsule TAKE 2-3 CAPSULES BY MOUTH EVERY DAY   fluticasone (FLONASE) 50 MCG/ACT nasal spray SPRAY 1 SPRAY INTO EACH NOSTRIL  DAILY   gabapentin (NEURONTIN) 600 MG tablet TAKE 1 TABLET BY MOUTH THREE TIMES A DAY   levothyroxine (SYNTHROID) 75 MCG tablet Take 1 tablet (75 mcg total) by mouth daily before breakfast. TAKE 1 TABLETBY MOUTH DAILY BEFORE BREAKFAST MONDAY-FRIDAY, THEN 1/2 SATURDAY AND SUNDAY   meclizine (ANTIVERT) 25 MG tablet TAKE 1 TABLET BY MOUTH THREE TIMES A DAY AS NEEDED FOR DIZZINESS   metoprolol succinate (TOPROL-XL) 25 MG 24 hr tablet TAKE 1 TABLET BY MOUTH TWICE A DAY   nitroGLYCERIN (NITROSTAT) 0.4 MG SL tablet Place 1 tablet (0.4 mg total) under the tongue every 5 (five) minutes as needed for chest pain.   nystatin-triamcinolone (MYCOLOG II) cream Apply 1 application topically 2 (two) times daily as needed (dry skin).    ondansetron (ZOFRAN) 8 MG tablet Take 1 tablet (8 mg total) by mouth every 8 (eight) hours as needed for nausea or vomiting.   pantoprazole (PROTONIX) 40 MG tablet TAKE 1 TABLET BY MOUTH TWICE A DAY   potassium chloride SA (KLOR-CON M20) 20 MEQ tablet TAKE 1 TABLET BY MOUTH DAILY AS NEEDED (FOR FLUID OR EDEMA).   rOPINIRole (REQUIP) 1 MG tablet Take 1 tablet (1 mg total) by mouth at bedtime. May Increase to 2 tablet at bedtime after 7 days if needed.   rosuvastatin (CRESTOR) 40 MG tablet TAKE 1  TABLET BY MOUTH EVERY DAY   tiZANidine (ZANAFLEX) 4 MG tablet Take 2 mg by mouth as needed for muscle spasms.   vitamin B-12 1000 MCG tablet Take 1 tablet (1,000 mcg total) by mouth daily.   No facility-administered encounter medications on file as of 02/26/2024.    Allergies (verified) Amitiza [lubiprostone], Nortriptyline, Sulfa antibiotics, Tramadol, Atorvastatin, and Claritin [loratadine]   History: Past Medical History:  Diagnosis Date   Allergy    Alopecia    Anal cancer (HCC) 08/14/13   invasive squamous cell ca, s/p radiation 10/20-11/26/14 60.4Gy/12fx and chemo   Anxiety    Aortoiliac occlusive disease (HCC) 08/14/2017   B12 deficiency anemia 09/14/2015   Blood transfusion  without reported diagnosis    BPPV (benign paroxysmal positional vertigo) 07/08/2015   Cardiomyopathy (HCC) 11/03/2017   Chronic back pain    Chronic daily headache 03/29/2013   takes bc powder   Closed right hip fracture (HCC) 09/10/2015   Coronary artery disease involving native coronary artery of native heart without angina pectoris 10/13/2017   DES to mid RCA   Depression    Essential (hemorrhagic) thrombocythemia (HCC) 09/06/2018   Essential hypertension 09/25/2023   Family history of early CAD 04/08/2016   GERD (gastroesophageal reflux disease)    History of hiatal hernia    Hot flashes    Hyperlipidemia    Hyperlipidemia associated with type 2 diabetes mellitus (HCC) 05/11/2017   Hypothyroidism 09/10/2015   IBS (irritable bowel syndrome) 03/29/2013   Neck pain 07/08/2015   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   takes neurotin   Peripheral vascular disease (HCC) 11/03/2017   QT prolongation    Sacroiliitis (HCC) 09/06/2018   Spasms of the hands or feet 10/08/2017   Syncope 06/07/2016   Tubular adenoma of colon 09/08/2003   Vertigo    Wears glasses    Past Surgical History:  Procedure Laterality Date   ABDOMINAL AORTOGRAM N/A 08/02/2017   Procedure: ABDOMINAL AORTOGRAM;  Surgeon: Iran Ouch, MD;  Location: [facility];  Service: Cardiovascular;  Laterality: N/A;   AORTA - BILATERAL FEMORAL ARTERY BYPASS GRAFT N/A 08/14/2017   Procedure: AORTA BIFEMORAL BYPASS GRAFT;  Surgeon: Larina Earthly, MD;  Location: [facility] OR;  Service: Vascular;  Laterality: N/A;   COLONOSCOPY     CORONARY STENT INTERVENTION N/A 10/13/2017   Procedure: CORONARY STENT INTERVENTION;  Surgeon: Yvonne Kendall, MD;  [facility]: [facility];  Service: Cardiovascular;  Laterality: N/A;   dental implant     ECTOPIC PREGNANCY SURGERY     EMBOLECTOMY N/A 08/14/2017   Procedure: EMBOLECTOMY FEMORAL;  Surgeon: Larina Earthly, MD;  Location: [facility] OR;  Service: Vascular;  Laterality: N/A;   FEMORAL-POPLITEAL BYPASS  GRAFT Right 08/14/2017   Procedure: Right Femoral to Above Knee Popliteal Bypass Graft using Non-Reversed Greater Saphenous Vein Graft from Right Leg;  Surgeon: Larina Earthly, MD;  Location: [facility] OR;  Service: Vascular;  Laterality: Right;   FLEXIBLE SIGMOIDOSCOPY N/A 08/14/2013   Procedure: FLEXIBLE SIGMOIDOSCOPY;  Surgeon: Meryl Dare, MD;  Location: [facility];  Service: Endoscopy;  Laterality: N/A;   LEFT HEART CATH AND CORONARY ANGIOGRAPHY N/A 10/13/2017   Procedure: LEFT HEART CATH AND CORONARY ANGIOGRAPHY;  Surgeon: Yvonne Kendall, MD;  [facility];  Service: Cardiovascular;  Laterality: N/A;   LOWER EXTREMITY ANGIOGRAPHY Bilateral 08/02/2017   Procedure: Lower Extremity Angiography;  Surgeon: Iran Ouch, MD;  Location: [facility];  Service: Cardiovascular;  Laterality: Bilateral;   MULTIPLE  TOOTH EXTRACTIONS     PERIPHERAL VASCULAR INTERVENTION  05/22/2018   Procedure: PERIPHERAL VASCULAR INTERVENTION;  Surgeon: Nada Libman, MD;  [facility]: [facility];  Service: Cardiovascular;;  SMA and Celiac   PILONIDAL CYST EXCISION     POLYPECTOMY     THROMBECTOMY FEMORAL ARTERY Right 08/14/2017   Procedure: THROMBECTOMY FEMORAL ARTERY;  Surgeon: Larina Earthly, MD;  Location: [facility] OR;  Service: Vascular;  Laterality: Right;   TONSILLECTOMY     TOTAL HIP ARTHROPLASTY  09/11/2015   Procedure: TOTAL HIP ARTHROPLASTY;  Surgeon: Sheral Apley, MD;  Location: [facility];  Service: Orthopedics;;   ULTRASOUND GUIDANCE FOR VASCULAR ACCESS  10/13/2017   Procedure: Ultrasound Guidance For Vascular Access;  Surgeon: Yvonne Kendall, MD;  [facility];  Service: Cardiovascular;;   VISCERAL ANGIOGRAPHY N/A 03/03/2020   Procedure: MESTENRIC ANGIOGRAPHY;  Surgeon: Nada Libman, MD;  [facility];  Service: Cardiovascular;  Laterality: N/A;   Family History  Problem Relation Age of Onset   Arthritis Mother    Hyperlipidemia Mother     Heart disease Mother    Hypertension Mother    Stroke Mother 38   Irritable bowel syndrome Mother    Thyroid disease Mother    Heart attack Mother    Heart disease Father    Hyperlipidemia Father    Hypertension Father    Stroke Father 49   Thyroid disease Father    Prostate cancer Father    Heart attack Father    Lung cancer Brother 24   Heart attack Maternal Grandfather    Heart attack Maternal Uncle    Colon cancer Neg Hx    Rectal cancer Neg Hx    Stomach cancer Neg Hx    Esophageal cancer Neg Hx    Social History   Socioeconomic History   Marital status: Married    Spouse name: Not on file   Number of children: 0   Years of education: Not on file   Highest education level: Not on file  Occupational History   Occupation: caregiver  Tobacco Use   Smoking status: Former    Current packs/day: 0.00    Types: Cigarettes    Quit date: 10/07/2014    Years since quitting: 9.3   Smokeless tobacco: Never  Vaping Use   Vaping status: Never Used  Substance and Sexual Activity   Alcohol use: No    Alcohol/week: 0.0 standard drinks of alcohol   Drug use: No   Sexual activity: Yes    Partners: Male  Other Topics Concern   Not on file  Social History Narrative   Work or School: homemaker      Home Situation: lives with her husband,[name] takes care of her elderly parents       Spiritual Beliefs: Christian      Lifestyle: no regular exercise, poor diet      Right handed             Social Drivers of Health   Financial Resource Strain: Low Risk  (02/26/2024)   Overall Financial Resource Strain (CARDIA)    Difficulty of Paying Living Expenses: Not hard at all  Food Insecurity: No Food Insecurity (02/26/2024)   Hunger Vital Sign    Worried About Running Out of Food in the Last Year: Never true    Ran Out of Food in the Last Year: Never true  Transportation Needs: No Transportation Needs (02/26/2024)   PRAPARE - Transportation  Lack of Transportation (Medical): No     Lack of Transportation (Non-Medical): No  Physical Activity: Inactive (02/26/2024)   Exercise Vital Sign    Days of Exercise per Week: 0 days    Minutes of Exercise per Session: 0 min  Stress: No Stress Concern Present (02/26/2024)   [employer] of Occupational Health - Occupational Stress Questionnaire    Feeling of Stress : Not at all  Social Connections: Socially Integrated (02/26/2024)   Social Connection and Isolation Panel [NHANES]    Frequency of Communication with Friends and Family: More than three times a week    Frequency of Social Gatherings with Friends and Family: More than three times a week    Attends Religious Services: More than 4 times per year    Active Member of [employer] or Organizations: Yes    Attends Engineer, structural: More than 4 times per year    Marital Status: Married    Tobacco Counseling Counseling given: Not Answered    Clinical Intake:  Pre-visit preparation completed: Yes  Pain : No/denies pain     BMI - recorded: 19.49 Nutritional Status: BMI of 19-24  Normal Nutritional Risks: None Diabetes: No  Lab Results  Component Value Date   HGBA1C 5.2 12/10/2021   HGBA1C 5.4 02/12/2020   HGBA1C 5.1 11/08/2019     How often do you need to have someone help you when you read instructions, pamphlets, or other written materials from your doctor or pharmacy?: 1 - Never  Interpreter Needed?: No  Information entered by :: Theresa Mulligan LPN   Activities of Daily Living     02/26/2024    3:26 PM 01/14/2024    8:12 AM  In your present state of health, do you have any difficulty performing the following activities:  Hearing? 0 0  Vision? 0 0  Difficulty concentrating or making decisions? 0 0  Walking or climbing stairs? 0   Dressing or bathing? 0   Doing errands, shopping? 0   Preparing Food and eating ? N   Using the Toilet? N   In the past six months, have you accidently leaked urine? Y   Comment Wears Breifs. Followed by PCP    Do you have problems with loss of bowel control? N   Managing your Medications? N   Managing your Finances? N   Housekeeping or managing your Housekeeping? N     Patient Care Team: Karie Georges, MD as PCP - General (Family Medicine) Tonny Bollman, MD as PCP - Cardiology (Cardiology) Van Clines, MD as Consulting Physician (Neurology) Kennon Rounds as Physician Assistant (Cardiology)  Indicate any recent Medical Services you may have received from other than [facility] providers in the past year (date may be approximate).     Assessment:   This is a routine wellness examination for [place].  Hearing/Vision screen Hearing Screening - Comments:: Denies hearing difficulties   Vision Screening - Comments:: Wears rx glasses - up to date with routine eye exams with  [facility]   Goals Addressed               This Visit's Progress     Remain Active (pt-stated)        Walk More.       Depression Screen     02/26/2024    3:24 PM 09/16/2022    2:45 PM 09/12/2022   11:29 AM 09/01/2022    2:37 PM 12/10/2021    2:14 PM 04/07/2021  1:28 PM 08/06/2019   11:49 AM  PHQ 2/9 Scores  PHQ - 2 Score 0 2 0 2 1 0 2  PHQ- 9 Score  15 0 16 10 5 11     Fall Risk     02/26/2024    3:27 PM 09/14/2023   10:16 AM 09/16/2022    2:45 PM 09/12/2022   11:32 AM 12/16/2019    9:25 AM  Fall Risk   Falls in the past year? 1 1 1 1 1   Number falls in past yr: 0 1 0 0 1  Injury with Fall? 0 0 0 0 0  Risk for fall due to : History of fall(s)  No Fall Risks No Fall Risks   Follow up Falls prevention discussed;Falls evaluation completed  Falls evaluation completed Falls prevention discussed     MEDICARE RISK AT HOME:  Medicare Risk at Home Any stairs in or around the home?: No If so, are there any without handrails?: No Home free of loose throw rugs in walkways, pet beds, electrical cords, etc?: Yes Adequate lighting in your home to reduce risk of falls?: Yes Life alert?:  No Use of a cane, walker or w/c?: No Grab bars in the bathroom?: Yes Shower chair or bench in shower?: Yes Elevated toilet seat or a handicapped toilet?: No  TIMED UP AND GO:  Was the test performed?  Yes  Length of time to ambulate 10 feet: 10 sec Gait steady and fast without use of assistive device  Cognitive Function: 6CIT completed        02/26/2024    3:30 PM 09/12/2022   11:33 AM  6CIT Screen  What Year? 0 points 0 points  What month? 0 points 0 points  What time? 0 points 0 points  Count back from 20 0 points 0 points  Months in reverse 0 points 0 points  Repeat phrase 0 points 0 points  Total Score 0 points 0 points    Immunizations Immunization History  Administered Date(s) Administered   Fluad Quad(high Dose 65+) 10/07/2020, 12/10/2021   Influenza Inj Mdck Quad Pf 08/26/2019   Influenza,inj,Quad PF,6+ Mos 09/12/2014, 08/03/2015, 08/15/2016, 10/26/2017, 08/16/2018   Moderna Sars-Covid-2 Vaccination 01/01/2020, 01/29/2020   PNEUMOCOCCAL CONJUGATE-20 12/10/2021   Pneumococcal Polysaccharide-23 10/27/2014    Screening Tests Health Maintenance  Topic Date Due   DTaP/Tdap/Td (1 - Tdap) Never done   Zoster Vaccines- Shingrix (1 of 2) Never done   OPHTHALMOLOGY EXAM  01/20/2020   COVID-19 Vaccine (3 - Moderna risk series) 02/26/2020   MAMMOGRAM  08/08/2020   Diabetic kidney evaluation - Urine ACR  02/11/2021   DEXA SCAN  Never done   HEMOGLOBIN A1C  06/09/2022   Colonoscopy  06/23/2023   INFLUENZA VACCINE  06/21/2024   Diabetic kidney evaluation - eGFR measurement  01/22/2025   Medicare Annual Wellness (AWV)  02/25/2025   Pneumonia Vaccine 65+ Years old  Completed   Hepatitis C Screening  Addressed   HPV VACCINES  Aged Out   FOOT EXAM  Discontinued    Health Maintenance  Health Maintenance Due  Topic Date Due   DTaP/Tdap/Td (1 - Tdap) Never done   Zoster Vaccines- Shingrix (1 of 2) Never done   OPHTHALMOLOGY EXAM  01/20/2020   COVID-19 Vaccine (3 -  Moderna risk series) 02/26/2020   MAMMOGRAM  08/08/2020   Diabetic kidney evaluation - Urine ACR  02/11/2021   DEXA SCAN  Never done   HEMOGLOBIN A1C  06/09/2022   Colonoscopy  06/23/2023  Health Maintenance Items Addressed: Mammogram ordered, DEXA ordered, Referral sent to GI for colonoscopy  Additional Screening:  Vision Screening: Recommended annual ophthalmology exams for early detection of glaucoma and other disorders of the eye.  Dental Screening: Recommended annual dental exams for proper oral hygiene  Community Resource Referral / Chronic Care Management: CRR required this visit?  No   CCM required this visit?  No     Plan:     I have personally reviewed and noted the following in the patient's chart:   Medical and social history Use of alcohol, tobacco or illicit drugs  Current medications and supplements including opioid prescriptions. Patient is not currently taking opioid prescriptions. Functional ability and status Nutritional status Physical activity Advanced directives List of other physicians Hospitalizations, surgeries, and ER visits in previous 12 months Vitals Screenings to include cognitive, depression, and falls Referrals and appointments  In addition, I have reviewed and discussed with patient certain preventive protocols, quality metrics, and best practice recommendations. A written personalized care plan for preventive services as well as general preventive health recommendations were provided to patient.     Tillie Rung, LPN   03/27/8468   After Visit Summary: (In Person-Printed) AVS printed and given to the patient  Notes: Nothing significant to report at this time.

## 2024-02-26 NOTE — Patient Instructions (Signed)
 Come back in 2 month for the thyroid blood test. -- schedule

## 2024-02-27 NOTE — Assessment & Plan Note (Signed)
 Refilled 75 mcg daily, will continue this for now and in 2 months she will need a repeat TSH for dosage adjustment. Orders placed

## 2024-02-27 NOTE — Assessment & Plan Note (Signed)
 On cymbalta 20 mg 2 capsules daily ad gabapentin 3 times daily, patient reports her symptoms are well controlled on this medication, will continue as prescribed. Also on ropinirole for her restless leg syndrome, continue as prescribed, refills were sent earlier last month.

## 2024-02-27 NOTE — Assessment & Plan Note (Signed)
 Reviewed medications, BP is well controlled today on her current regimen, will continue as prescribed,see below. Current hypertension medications:       Sig   metoprolol succinate (TOPROL-XL) 25 MG 24 hr tablet (Taking) TAKE 1 TABLET BY MOUTH TWICE A DAY

## 2024-02-29 ENCOUNTER — Telehealth: Payer: Self-pay | Admitting: Family Medicine

## 2024-02-29 ENCOUNTER — Other Ambulatory Visit: Payer: Self-pay | Admitting: Cardiovascular Disease

## 2024-02-29 DIAGNOSIS — E039 Hypothyroidism, unspecified: Secondary | ICD-10-CM

## 2024-02-29 MED ORDER — LEVOTHYROXINE SODIUM 75 MCG PO TABS: 75.0000 ug | ORAL_TABLET | Freq: Every day | ORAL | 1 refills | Status: DC

## 2024-02-29 NOTE — Addendum Note (Signed)
 Addended by: Johnella Moloney on: 02/29/2024 01:30 PM   Modules accepted: Orders

## 2024-02-29 NOTE — Telephone Encounter (Signed)
 Showing reordered but not showing receipt from pharmacy.  Copied from CRM [id number]. Topic: Clinical - Prescription Issue >> Feb 29, 2024 11:02 AM Lorin Glass B wrote: Reason for CRM: Patient states she was in the office on Monday to see Dr. Casimiro Needle and was prescribed levothyroxine (SYNTHROID) 75 MCG tablet. States that her pharmacy still does not have Rx sent over, and inquiring on the delay or if anything can be done to get it sent over today as she has to start the Rx to get testing done. Callback [phone]  [facility] [address] [place] [address] Phone: [phone] Fax: [phone]

## 2024-02-29 NOTE — Telephone Encounter (Signed)
 Yes ok please send-- not sure why it printed but it should have been an electronic script

## 2024-02-29 NOTE — Telephone Encounter (Signed)
 Spoke with the patient, informed her of the message below and she is aware the Rx was re-sent.

## 2024-03-21 ENCOUNTER — Telehealth: Payer: Self-pay | Admitting: Family Medicine

## 2024-03-21 DIAGNOSIS — R238 Other skin changes: Secondary | ICD-10-CM

## 2024-03-21 NOTE — Telephone Encounter (Signed)
 Copied from CRM [id number]. Topic: Referral - Request for Referral >> Mar 21, 2024 11:26 AM Chuck Crater wrote: Did the patient discuss referral with their provider in the last year? No (If No - schedule appointment) (If Yes - send message)  Appointment offered? No  Type of order/referral and detailed reason for visit: Referral to Dermatology due to scalp/itching   Preference of office, provider, location: [facility] on [address] [place], [place] [address] [phone] Monday-Thursday 8-5:30pm  If referral order, have you been seen by this specialty before? No (If Yes, this issue or another issue? When? Where?  Can we respond through MyChart? Yes

## 2024-03-22 ENCOUNTER — Other Ambulatory Visit: Payer: Self-pay | Admitting: Family Medicine

## 2024-03-22 DIAGNOSIS — G2581 Restless legs syndrome: Secondary | ICD-10-CM

## 2024-03-22 NOTE — Telephone Encounter (Signed)
 Ok to place referral, however it may take several months to get her in-- if she would like me to examine her and give her some options have her schedule with me in the office

## 2024-03-22 NOTE — Telephone Encounter (Signed)
 Patient informed of the message below, is aware the referral was placed and someone will contact her with appt info.  Appt scheduled for 5/9 with PCP also for evaluation.

## 2024-03-29 ENCOUNTER — Ambulatory Visit (INDEPENDENT_AMBULATORY_CARE_PROVIDER_SITE_OTHER): Admitting: Family Medicine

## 2024-03-29 ENCOUNTER — Encounter: Payer: Self-pay | Admitting: Family Medicine

## 2024-03-29 VITALS — BP 140/70 | HR 66 | Temp 98.1°F | Ht 63.0 in | Wt 105.3 lb

## 2024-03-29 DIAGNOSIS — M65312 Trigger thumb, left thumb: Secondary | ICD-10-CM

## 2024-03-29 DIAGNOSIS — K432 Incisional hernia without obstruction or gangrene: Secondary | ICD-10-CM | POA: Diagnosis not present

## 2024-03-29 DIAGNOSIS — L219 Seborrheic dermatitis, unspecified: Secondary | ICD-10-CM | POA: Diagnosis not present

## 2024-03-29 DIAGNOSIS — L309 Dermatitis, unspecified: Secondary | ICD-10-CM | POA: Diagnosis not present

## 2024-03-29 MED ORDER — KETOCONAZOLE 2 % EX SHAM: MEDICATED_SHAMPOO | CUTANEOUS | 5 refills | Status: AC

## 2024-03-29 MED ORDER — TRIAMCINOLONE ACETONIDE 0.1 % EX CREA: 1.0000 | TOPICAL_CREAM | Freq: Two times a day (BID) | CUTANEOUS | 3 refills | Status: DC

## 2024-03-29 NOTE — Progress Notes (Unsigned)
   Acute Office Visit  Subjective:     Patient ID: Erin [name], female    DOB: 19-Oct-1956, 68 y.o.   MRN: [id number]  Chief Complaint  Patient presents with   Hair/Scalp Problem    Patient complains of pus-filled "bumps' in the scalp, especially noted when sweating or "feels nervous" x2-3 years, tried medicated shampoo with some relief, worse lately, itchy eyebrows also   Rash    "Clear rash" and itching noted on the right upper back and left thigh x2-3 years, tried Triamicinolone cream with some relief    Rash   Patient is in today for a rash in her scalp. States that she is constantly itchy, states that the come up as little "bumps" then she pops them and they dry up and like scaly bumps. States she thought it was psoriasis and she tried shampoo for this which helps with the itching, however it never goes away. Has places on her upper back and the left thigh that is constantly itchy as well. Has been using triamcinolone  cream at home on the shoulder and thigh, has been using it three times a day  Review of Systems  Skin:  Positive for rash.  All other systems reviewed and are negative.       Objective:    BP (!) 140/70   Pulse 66   Temp 98.1 F (36.7 C) (Oral)   Ht 5\' 3"  (1.6 m)   Wt 105 lb 4.8 oz (47.8 kg)   SpO2 98%   BMI 18.65 kg/m  {Vitals History (Optional):23777}  Physical Exam  No results found for any visits on 03/29/24.      Assessment & Plan:   Problem List Items Addressed This Visit   None Visit Diagnoses       Seborrheic dermatitis    -  Primary   Relevant Medications   ketoconazole (NIZORAL) 2 % shampoo (Start on 04/01/2024)     Eczema, unspecified type       Relevant Medications   triamcinolone  cream (KENALOG) 0.1 %     Incisional hernia, without obstruction or gangrene       Relevant Orders   Ambulatory referral to General Surgery     Trigger finger of left thumb       Relevant Orders   Ambulatory referral to Hand Surgery        Meds ordered this encounter  Medications   ketoconazole (NIZORAL) 2 % shampoo    Sig: Apply to hair daily for 7 days -- leave on for 5 minutes, then wash every other day for 7 days, then wash twice a week for 7 days,  then once a week.    Dispense:  120 mL    Refill:  5   triamcinolone  cream (KENALOG) 0.1 %    Sig: Apply 1 Application topically 2 (two) times daily.    Dispense:  80 g    Refill:  3    No follow-ups on file.  Aida House, MD

## 2024-04-02 NOTE — Telephone Encounter (Unsigned)
 Copied from CRM [id number]. Topic: Clinical - Medication Refill >> Apr 02, 2024 11:37 AM [place] wrote: Medication: pantoprazole  (PROTONIX ) 40 MG tablet  Has the patient contacted their pharmacy? Yes (Agent: If no, request that the patient contact the pharmacy for the refill. If patient does not wish to contact the pharmacy document the reason why and proceed with request.) (Agent: If yes, when and what did the pharmacy advise?)  This is the patient's preferred pharmacy:  [id number] [place] [place] - [address] [facility] [address] [address] [place] [address] Phone: [phone] Fax: [phone]   Is this the correct pharmacy for this prescription? Yes If no, delete pharmacy and type the correct one.   Has the prescription been filled recently? Yes  Is the patient out of the medication? Yes  Has the patient been seen for an appointment in the last year OR does the patient have an upcoming appointment? Yes  Can we respond through MyChart? Yes  Agent: Please be advised that Rx refills may take up to 3 business days. We ask that you follow-up with your pharmacy.

## 2024-04-03 ENCOUNTER — Telehealth: Payer: Self-pay | Admitting: Gastroenterology

## 2024-04-03 ENCOUNTER — Telehealth: Payer: Self-pay | Admitting: Family Medicine

## 2024-04-03 ENCOUNTER — Other Ambulatory Visit: Payer: Self-pay | Admitting: Internal Medicine

## 2024-04-03 MED ORDER — NITROGLYCERIN 0.4 MG SL SUBL: 0.4000 mg | SUBLINGUAL_TABLET | SUBLINGUAL | 3 refills | Status: AC | PRN

## 2024-04-03 MED ORDER — PANTOPRAZOLE SODIUM 40 MG PO TBEC: 40.0000 mg | DELAYED_RELEASE_TABLET | Freq: Two times a day (BID) | ORAL | 0 refills | Status: DC

## 2024-04-03 NOTE — Telephone Encounter (Signed)
 Inbound call from patient requesting a refill for pantoprazole . Patient is scheduled for 7/29. Please advise, thank you

## 2024-04-03 NOTE — Telephone Encounter (Signed)
 Copied from CRM [id number]. Topic: Clinical - Medication Refill >> Apr 03, 2024  1:04 PM [place] J wrote: Medication: pantoprazole  (PROTONIX ) 40 MG tablet  Has the patient contacted their pharmacy? Yes (Agent: If no, request that the patient contact the pharmacy for the refill. If patient does not wish to contact the pharmacy document the reason why and proceed with request.) (Agent: If yes, when and what did the pharmacy advise?) Pharmacy states that they've reached out for a refill on this medication but I did not find the request. This is the patient's preferred pharmacy:  [id number] - [place], [place] - [address] [place] [address] Phone: [phone] Fax: [phone]  Is this the correct pharmacy for this prescription? Yes If no, delete pharmacy and type the correct one.   Has the prescription been filled recently? No  Is the patient out of the medication? Yes  Has the patient been seen for an appointment in the last year OR does the patient have an upcoming appointment? Yes  Can we respond through MyChart? Yes  Agent: Please be advised that Rx refills may take up to 3 business days. We ask that you follow-up with your pharmacy.

## 2024-04-03 NOTE — Telephone Encounter (Signed)
 Patient has upcoming appt with Santina Cull, PA on 06/18/24. Prescription sent to patient's pharmacy until scheduled appt.

## 2024-04-03 NOTE — Telephone Encounter (Signed)
 I called the patient for more information.  Patient stated there is confusion about her medications at the pharmacy.  Patient was advised Pantoprazole  was previously prescribed by Dr Arminda Berth and stated she will call the GI office for refills.

## 2024-04-03 NOTE — Telephone Encounter (Signed)
 I think this patient is a patient of Dr. Arlester Ladd.

## 2024-04-11 NOTE — Progress Notes (Addendum)
 [facility] Return Visit   Referring Provider Aida House, MD [address] [place],  [place] [address]  Primary Care Provider Aida House, MD  Patient Profile: Erin [name] is a 68 y.o. female with a past medical history noteworthy for CAD, cardiomyopathy, PVD, mesenteric artery stenosis, HTN, hypothyroidism who returns to the [facility] for follow-up of the problem(s) noted below.  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 Sandrea Cruel   Current GI Meds  Pantoprazole  40 mg p.o. twice daily Ondansetron  as needed  Interval History   Anorectal pain History of adenomatous colon polyps/anal cancer -- Her primary concern today is regarding anorectal discomfort that began 04/08/2023 -- States that she missed her senna for 2 days while in the process of a move and was also lifting heavy moving boxes -- Finally had a bowel movement on 04/09/2024 that was exceedingly large with associated blood -- Has experienced anal rectal pain and discomfort and feeling that her "rectum is falling out", describes spasming in her rectum -- Husband looked at her anorectal area and did not see any prolapse -- Has been using RectiCare with some benefit -- Resumed her laxatives but still having anorectal discomfort -- Feels that is difficult to sometimes sit upright due to anal rectal pain  -- Reports that her anal cancer has been in remission -- Last 3 colonoscopies in 2019, 2020 and 2023 have shown numerous polyps -tubular adenoma and sessile serrated polyps as well as a 25 mm TA -- Denies a family history of colorectal  cancer or colon polyps -- She declines genetic counseling referral today citing that she has no living immediate blood relatives   Chronic idiopathic constipation -- Longstanding history of constipation -- Linzess  72 mcg resulted in diarrhea -- Dr. Sandrea Cruel gave her samples of Trulance  which did not yield benefit -- Pleased with her current regimen of a probiotic called Emma in conjunction with 3-4 senna at night -- Enema/senna combination usually results in 1 bowel movement daily -- Since she missed her laxatives recently stools have been vacillating between liquid and solid -- Query if she may have a component of encopresis at this time-suggested KUB   GERD/dysphagia -- GERD is currently stable on pantoprazole  40 mg p.o. 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 -states she pushes on the right side of her neck when she eats to help food/pills go down  Incisional hernia -- This appears stable on exam today -- States that she noted some discomfort when she was lifting heavy moving boxes and we discussed the importance of limiting that type of physical activity  Last colonoscopy: 06/2022 -anal canal deformity, 15 polyps measuring 3 to 10 mm all TA Last endoscopy: 06/2022 - benign stenosis at GE junction Savary dilated to 15 mm, small HH  Last Abd CT/CTE/MRE:  CTA 04/2018 -no acute findings, no evidence of mesenteric ischemia  GI Review of Symptoms Significant for anorectal pain, constipation, dysphagia. Otherwise negative.  General Review of Systems  Review of systems is significant for the pertinent positives and negatives as listed per  the HPI.  Full ROS is otherwise negative.  Past Medical History   Past Medical History:  Diagnosis Date   Allergy    Alopecia    Anal cancer (HCC) 08/14/13   invasive squamous cell ca, s/p radiation 10/20-11/26/14 60.4Gy/50fx and chemo   Anxiety     Aortoiliac occlusive disease (HCC) 08/14/2017   B12 deficiency anemia 09/14/2015   Blood transfusion without reported diagnosis    BPPV (benign paroxysmal positional vertigo) 07/08/2015   Cardiomyopathy (HCC) 11/03/2017   Chronic back pain    Chronic daily headache 03/29/2013   takes bc powder   Closed right hip fracture (HCC) 09/10/2015   Coronary artery disease involving native coronary artery of native heart without angina pectoris 10/13/2017   DES to mid RCA   Depression    Essential (hemorrhagic) thrombocythemia (HCC) 09/06/2018   Essential hypertension 09/25/2023   Family history of early CAD 04/08/2016   GERD (gastroesophageal reflux disease)    History of hiatal hernia    Hot flashes    Hyperlipidemia    Hyperlipidemia associated with type 2 diabetes mellitus (HCC) 05/11/2017   Hypothyroidism 09/10/2015   IBS (irritable bowel syndrome) 03/29/2013   Neck pain 07/08/2015   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   takes neurotin   Peripheral vascular disease (HCC) 11/03/2017   QT prolongation    Sacroiliitis (HCC) 09/06/2018   Spasms of the hands or feet 10/08/2017   Syncope 06/07/2016   Tubular adenoma of colon 09/08/2003   Vertigo    Wears glasses      Past Surgical History   Past Surgical History:  Procedure Laterality Date   ABDOMINAL AORTOGRAM N/A 08/02/2017   Procedure: ABDOMINAL AORTOGRAM;  Surgeon: Wenona Hamilton, MD;  Location: [facility];  Service: Cardiovascular;  Laterality: N/A;   AORTA - BILATERAL FEMORAL ARTERY BYPASS GRAFT N/A 08/14/2017   Procedure: AORTA BIFEMORAL BYPASS GRAFT;  Surgeon: Mayo Speck, MD;  Location: [facility] OR;  Service: Vascular;  Laterality: N/A;   COLONOSCOPY     CORONARY STENT INTERVENTION N/A 10/13/2017   Procedure: CORONARY STENT INTERVENTION;  Surgeon: Sammy Crisp, MD;  Location: [facility];  Service: Cardiovascular;  Laterality: N/A;   dental implant     ECTOPIC PREGNANCY SURGERY     EMBOLECTOMY N/A 08/14/2017   Procedure:  EMBOLECTOMY FEMORAL;  Surgeon: Mayo Speck, MD;  Location: [facility] OR;  Service: Vascular;  Laterality: N/A;   FEMORAL-POPLITEAL BYPASS GRAFT Right 08/14/2017   Procedure: Right Femoral to Above Knee Popliteal Bypass Graft using Non-Reversed Greater Saphenous Vein Graft from Right Leg;  Surgeon: Mayo Speck, MD;  Location: [facility] OR;  Service: Vascular;  Laterality: Right;   FLEXIBLE SIGMOIDOSCOPY N/A 08/14/2013   Procedure: FLEXIBLE SIGMOIDOSCOPY;  Surgeon: Asencion Blacksmith, MD;  Location: [facility];  Service: Endoscopy;  Laterality: N/A;   LEFT HEART CATH AND CORONARY ANGIOGRAPHY N/A 10/13/2017   Procedure: LEFT HEART CATH AND CORONARY ANGIOGRAPHY;  Surgeon: Sammy Crisp, MD;  Location: [facility];  Service: Cardiovascular;  Laterality: N/A;   LOWER EXTREMITY ANGIOGRAPHY Bilateral 08/02/2017   Procedure: Lower Extremity Angiography;  Surgeon: Wenona Hamilton, MD;  Location: [facility] INVASIVE CV LAB;  Service: Cardiovascular;  Laterality: Bilateral;   MULTIPLE TOOTH EXTRACTIONS     PERIPHERAL VASCULAR INTERVENTION  05/22/2018   Procedure: PERIPHERAL VASCULAR INTERVENTION;  Surgeon: Margherita Shell, MD;  Location: [facility];  Service: Cardiovascular;;  SMA and Celiac   PILONIDAL CYST EXCISION     POLYPECTOMY  THROMBECTOMY FEMORAL ARTERY Right 08/14/2017   Procedure: THROMBECTOMY FEMORAL ARTERY;  Surgeon: Mayo Speck, MD;  Location: [facility] OR;  Service: Vascular;  Laterality: Right;   TONSILLECTOMY     TOTAL HIP ARTHROPLASTY  09/11/2015   Procedure: TOTAL HIP ARTHROPLASTY;  Surgeon: Saundra Curl, MD;  Location: [facility] OR;  Service: Orthopedics;;   ULTRASOUND GUIDANCE FOR VASCULAR ACCESS  10/13/2017   Procedure: Ultrasound Guidance For Vascular Access;  Surgeon: Sammy Crisp, MD;  Location: [facility];  Service: Cardiovascular;;   VISCERAL ANGIOGRAPHY N/A 03/03/2020   Procedure: MESTENRIC ANGIOGRAPHY;  Surgeon: Margherita Shell, MD;  Location: [facility];  Service:  Cardiovascular;  Laterality: N/A;     Allergies and Medications   Allergies  Allergen Reactions   Amitiza  [Lubiprostone ] Diarrhea    Uncontrollable diarrhea   Nortriptyline  Other (See Comments)    Stomach distention   Sulfa Antibiotics Nausea And Vomiting and Other (See Comments)    GI distress/pain   Tramadol  Itching and Rash   Atorvastatin  Nausea And Vomiting   Claritin [Loratadine] Other (See Comments)    Hot flashes   Current Meds  Medication Sig   acetaminophen  (TYLENOL ) 500 MG tablet Take 1,000 mg by mouth daily as needed for headache (pain).    acyclovir  ointment (ZOVIRAX ) 5 % Apply 1 Application topically every 3 (three) hours as needed (fever blisters).   Cholecalciferol (VITAMIN D ) 2000 units tablet Take 2,000 Units by mouth daily.   clopidogrel  (PLAVIX ) 75 MG tablet TAKE 1 TABLET BY MOUTH EVERY DAY   diltiazem  2 % GEL Apply 1 Application topically 2 (two) times daily at 10 am and 4 pm.   fluticasone  (FLONASE ) 50 MCG/ACT nasal spray SPRAY 1 SPRAY INTO EACH NOSTRIL DAILY   gabapentin  (NEURONTIN ) 600 MG tablet TAKE 1 TABLET BY MOUTH THREE TIMES A DAY   ketoconazole  (NIZORAL ) 2 % shampoo Apply to hair daily for 7 days -- leave on for 5 minutes, then wash every other day for 7 days, then wash twice a week for 7 days,  then once a week.   levothyroxine  (SYNTHROID ) 75 MCG tablet Take 1 tablet (75 mcg total) by mouth daily before breakfast. TAKE 1 TABLETBY MOUTH DAILY BEFORE BREAKFAST MONDAY-FRIDAY, THEN 1/2 SATURDAY AND SUNDAY   meclizine  (ANTIVERT ) 25 MG tablet TAKE 1 TABLET BY MOUTH THREE TIMES A DAY AS NEEDED FOR DIZZINESS   metoprolol  succinate (TOPROL -XL) 25 MG 24 hr tablet TAKE 1 TABLET BY MOUTH TWICE A DAY   [EXPIRED] Na Sulfate-K Sulfate-Mg Sulfate concentrate (SUPREP) 17.5-3.13-1.6 GM/177ML SOLN Take 1 kit (354 mLs total) by mouth once for 1 dose.   nitroGLYCERIN  (NITROSTAT ) 0.4 MG SL tablet Place 1 tablet (0.4 mg total) under the tongue every 5 (five) minutes as needed  for chest pain.   nystatin -triamcinolone  (MYCOLOG II) cream Apply 1 application topically 2 (two) times daily as needed (dry skin).    ondansetron  (ZOFRAN ) 8 MG tablet Take 1 tablet (8 mg total) by mouth every 8 (eight) hours as needed for nausea or vomiting.   pantoprazole  (PROTONIX ) 40 MG tablet Take 1 tablet (40 mg total) by mouth 2 (two) times daily.   potassium chloride  SA (KLOR-CON  M20) 20 MEQ tablet TAKE 1 TABLET BY MOUTH DAILY AS NEEDED (FOR FLUID OR EDEMA).   rOPINIRole  (REQUIP ) 1 MG tablet TAKE 1 TABLET (1 MG TOTAL) BY MOUTH AT BEDTIME. MAY INCREASE TO 2 TABLET AT BEDTIME AFTER 7 DAYS IF NEEDED.   rosuvastatin  (CRESTOR ) 40 MG tablet Take  1 tablet (40 mg total) by mouth daily.   tiZANidine (ZANAFLEX) 4 MG tablet Take 2 mg by mouth as needed for muscle spasms.   triamcinolone  cream (KENALOG ) 0.1 % Apply 1 Application topically 2 (two) times daily.   vitamin B-12 1000 MCG tablet Take 1 tablet (1,000 mcg total) by mouth daily.     Family His   Family History  Problem Relation Age of Onset   Arthritis Mother    Hyperlipidemia Mother    Heart disease Mother    Hypertension Mother    Stroke Mother 65   Irritable bowel syndrome Mother    Thyroid  disease Mother    Heart attack Mother    Heart disease Father    Hyperlipidemia Father    Hypertension Father    Stroke Father 70   Thyroid  disease Father    Prostate cancer Father    Heart attack Father    Lung cancer Brother 50   Heart attack Maternal Grandfather    Heart attack Maternal Uncle    Colon cancer Neg Hx    Rectal cancer Neg Hx    Stomach cancer Neg Hx    Esophageal cancer Neg Hx     Social History   Social History   Tobacco Use   Smoking status: Former    Current packs/day: 0.00    Types: Cigarettes    Quit date: 10/07/2014    Years since quitting: 9.5   Smokeless tobacco: Never  Vaping Use   Vaping status: Never Used  Substance Use Topics   Alcohol use: No    Alcohol/week: 0.0 standard drinks of alcohol    Drug use: No   Kura reports that she quit smoking about 9 years ago. Her smoking use included cigarettes. She has never used smokeless tobacco. She reports that she does not drink alcohol and does not use drugs.  Vital Signs and Physical Examination   Vitals:   04/12/24 1318  BP: (!) 140/68  Pulse: 88   Body mass index is 17.71 kg/m. Weight: 100 lb (45.4 kg)  General: Thin, petite woman in no acute distress Head: Normocephalic and atraumatic Eyes: Sclerae anicteric, EOMI Lungs: Clear throughout to auscultation Heart: Regular rate and rhythm; No murmurs, rubs or bruits Abdomen: Soft, non tender and non distended. No masses, hepatosplenomegaly or hernias noted. Normal Bowel sounds, well-healed midline abdominal scar without any protruding hernia today Rectal: Scarring in perianal region, no external hemorrhoids, no rectal prolapse, posterior anal fissure present; palpable soft tissue on DRE most likely consistent with hemorrhoids Musculoskeletal: Symmetrical with no gross deformities    Review of Data   The following data was reviewed at the time of this encounter:   Laboratory Studies      Latest Ref Rng & Units 01/23/2024    2:28 PM 01/17/2024    3:19 AM 01/16/2024    4:46 AM  CBC  WBC 4.0 - 10.5 K/uL 9.3  9.3  8.1   Hemoglobin 12.0 - 15.0 g/dL 40.9  9.3  9.2   Hematocrit 36.0 - 46.0 % 33.1  29.4  28.3   Platelets 150.0 - 400.0 K/uL 451.0  389  355     Lab Results  Component Value Date   LIPASE 46 01/14/2024      Latest Ref Rng & Units 01/23/2024    2:28 PM 01/17/2024    3:19 AM 01/16/2024    4:46 AM  CMP  Glucose 70 - 99 mg/dL 88  84  89   BUN  6 - 23 mg/dL 17  16  16    Creatinine 0.40 - 1.20 mg/dL 6.04  5.40  9.81   Sodium 135 - 145 mEq/L 137  139  138   Potassium 3.5 - 5.1 mEq/L 4.4  4.8  3.4   Chloride 96 - 112 mEq/L 100  105  107   CO2 19 - 32 mEq/L 28  25  23    Calcium  8.4 - 10.5 mg/dL 9.3  8.5  8.1      Imaging Studies  Barium esophagram  05/06/2022 No mass or stricture.  Mild dysmotility in the prone position.   Prominent cricopharyngeus and small web ventrally at the same level without apparent obstruction. Possible tiny early Zenker's diverticulum.  MBS 05/06/2022 Pt demonstrated a minimal pharyngeal, moderate cervical esophageal and suspected esophageal dysphagia. Had an esophagram prior to MBS resulting in "mild dysmotility in the prone position. Prominent cricopharyngeus and small web ventrally at the same level without apparent obstruction. 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 During MBS her timing of swallow is within functional limits. She performs an effortful and Mendelsohn maneuver (continuous laryngeal elevation during and after most swallows appearing to assist in transit through upper esophageal sphincter. There was no aspiration but flash laryngeal penetration that exited vestibule during swallow x 1 with thin that is in normal range. Minimal and intermittent vallecular and pyriform sinus residue possibly due to decreased pharyngeal pressures due to suspected esophageal involvement. In swallowing pill pt spontaneously performed a left head turn and pressed into left pharynx with hand to assist in propulsion. Pill appeared to lodge in pharynocele and needed second swallow to propel into cervical esophagus. Multiple sips liquid was not effective in propelling pill from mid-upper esopahgus however applesauce did move pill slowly to stomach. Moderate stasis in esophageal seen. Pt educated on esophageal precautions, many she was already performing with verbal and written means. Recommend continue regular texture (states he has to cut her meats into smaller pieces), thin liquids, pills with applesauce and multiple bites to assist in esophageal propulsion, alternate liquids and solids and esophageal precautions. Would she  benefit from EGD given observations and history of esophageal web?   CTA 04/2018 1. No acute findings within the abdomen or pelvis. Specifically, no evidence of acute mesenteric ischemia.  Proximal Outflow: Post bilateral femoral bypass grafting. There is a potential hemodynamically significant stenosis involving the origin/proximal aspect of the right femoral bypass graft (image 107, series 11), suboptimally evaluated secondary streak artifact from the adjacent right total hip prosthesis. The left femoral bypass graft appears patent throughout its imaged course.   Veins: The pelvic venous system and IVC appear widely patent however there is mass effect of the left common iliac vein by the right limb of the aorta bypass graft but this finding is without definitive development adjacent venous collaterals and of uncertain clinical significance.   Review of the MIP images confirms the above findings.  GI Procedures and Studies  EGD/Colonoscopy 06/2022 EGD -  benign stenosis at GE junction Savary dilated to 15 mm, small HH Colonoscopy - anal canal deformity, 15 polyps measuring 3 to 10 mm all TA  Colonoscopy 06/2019 8 polyps measuring 6 to 10 mm   (TA and SSP)  EGD/Colonoscopy 10/2018 EGD - normal Colonoscopy - 25 mm polyp in TC, 10 4-12 mm polyps   Clinical Impression  It is my clinical impression that Ms. Dragoo is a 68 y.o. 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 Vogl presents to the office today with primary concern regarding anorectal pain and discomfort that began earlier this week after missing her laxatives.  Reports that she subsequently passed a large bowel movement with associated blood.  The bleeding has now ceased but she  continues to experience anorectal discomfort and a spasming type sensation.  A posterior anal fissure is present on physical examination.  Discussed that this is likely the cause of her anorectal discomfort and spasming.  At today's visit we will prescribe diltiazem  gel in conjunction with the RectiCare she is currently using.  She is also coming due for a surveillance colonoscopy for history of polyps and anal cancer.  We coordinated this today.  She will need to hold her Plavix  x 5 days which will be coordinated with the prescribing provider.  States she has not had any issues holding her Plavix  for prior colonoscopies.  I did offer to refer her for genetic counseling as she meets criteria for evaluation of colon polyposis syndromes which she declines today citing she has no living biological relatives for whom this information would have an impact.  With regard to her chronic idiopathic constipation she has not found Linzess  or Trulance  helpful.  Developed diarrhea with Linzess .  She is pleased with her current regimen of a probiotic called Emma in conjunction with senna.  No changes will be made.  Her chronic GERD and dysphagia are also stable.  She will continue on her current regimen of pantoprazole .  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  She has been able to manage this by pressing on the side of her neck when she swallows.  Plan  Prescribe 2% diltiazem  gel to apply to anorectal area 2-4 times daily for management of anal fissure pain Continue RectiCare as needed Maintain soft stools and continue current regimen of Emma probiotic and senna Schedule surveillance colonoscopy at [facility]-hold Plavix  x 5 days and coordinate with prescribing provider Continue pantoprazole  40 mg p.o. twice daily Continue speech pathology maneuvers for management of dysphagia and pharyngoesophageal Advised avoiding heavy lifting in the setting of incisional hernia   Planned Follow Up 2-3 months  The  patient or caregiver verbalized understanding of the material covered, with no barriers to understanding. All questions were answered. Patient or caregiver is agreeable with the plan outlined above.    It was a pleasure to see Zanovia.  If you have any questions or concerns regarding this evaluation, do not hesitate to contact me.  Eugenia Hess, MD [facility]   I spent total of 40  minutes in both face-to-face (25 minutes interview, physical exam) and  non-face-to-face (15 minutes chart review, coordination of care, documentation) activities, excluding procedures performed, for the visit on the date of this encounter.

## 2024-04-12 ENCOUNTER — Ambulatory Visit: Admitting: Pediatrics

## 2024-04-12 ENCOUNTER — Encounter: Payer: Self-pay | Admitting: Pediatrics

## 2024-04-12 VITALS — BP 140/68 | HR 88 | Ht 63.0 in | Wt 100.0 lb

## 2024-04-12 DIAGNOSIS — K219 Gastro-esophageal reflux disease without esophagitis: Secondary | ICD-10-CM

## 2024-04-12 DIAGNOSIS — R109 Unspecified abdominal pain: Secondary | ICD-10-CM

## 2024-04-12 DIAGNOSIS — Z860101 Personal history of adenomatous and serrated colon polyps: Secondary | ICD-10-CM

## 2024-04-12 DIAGNOSIS — Z860100 Personal history of colon polyps, unspecified: Secondary | ICD-10-CM

## 2024-04-12 DIAGNOSIS — K551 Chronic vascular disorders of intestine: Secondary | ICD-10-CM | POA: Diagnosis not present

## 2024-04-12 DIAGNOSIS — K602 Anal fissure, unspecified: Secondary | ICD-10-CM

## 2024-04-12 DIAGNOSIS — K6289 Other specified diseases of anus and rectum: Secondary | ICD-10-CM | POA: Diagnosis not present

## 2024-04-12 DIAGNOSIS — K5900 Constipation, unspecified: Secondary | ICD-10-CM

## 2024-04-12 DIAGNOSIS — Z85048 Personal history of other malignant neoplasm of rectum, rectosigmoid junction, and anus: Secondary | ICD-10-CM

## 2024-04-12 DIAGNOSIS — R09A2 Foreign body sensation, throat: Secondary | ICD-10-CM

## 2024-04-12 DIAGNOSIS — K5904 Chronic idiopathic constipation: Secondary | ICD-10-CM

## 2024-04-12 DIAGNOSIS — R1310 Dysphagia, unspecified: Secondary | ICD-10-CM

## 2024-04-12 MED ORDER — NA SULFATE-K SULFATE-MG SULF 17.5-3.13-1.6 GM/177ML PO SOLN: 1.0000 | Freq: Once | ORAL | 0 refills | Status: AC

## 2024-04-12 MED ORDER — DILTIAZEM GEL 2 %: 1.0000 | Freq: Two times a day (BID) | CUTANEOUS | 1 refills | Status: DC

## 2024-04-12 NOTE — Patient Instructions (Addendum)
 You have been scheduled for a colonoscopy. Please follow written instructions given to you at your visit today.   If you use inhalers (even only as needed), please bring them with you on the day of your procedure.  DO NOT TAKE 7 DAYS PRIOR TO TEST- Trulicity (dulaglutide) Ozempic, Wegovy (semaglutide) Mounjaro (tirzepatide) Bydureon Bcise (exanatide extended release)  DO NOT TAKE 1 DAY PRIOR TO YOUR TEST Rybelsus (semaglutide) Adlyxin (lixisenatide) Victoza (liraglutide) Byetta (exanatide) ___________________________________________________________________________    Your provider has requested that you have an abdominal x ray.  Please go to the basement floor to our Radiology department for the test.     We have sent a prescription for Diltiazem  2% gel to [facility] for you. Using your index finger, you should apply a small amount of medication inside the rectum up to your first knuckle/joint twice daily x 4 weeks.  [facility] : [facility]'s information is below: Address: [address], [place], [place] [address]  [phone]  *Please DO NOT go directly from our office to pick up this medication! Give the pharmacy 1 day to process the prescription as this is compounded and takes time to make.   Thank you for entrusting me with your care and for choosing [facility],  Dr. Eugenia Hess   _______________________________________________________  If your blood pressure at your visit was 140/90 or greater, please contact your primary care physician to follow up on this.  _______________________________________________________  If you are age 48 or older, your body mass index should be between 23-30. Your Body mass index is 17.71 kg/m. If this is out of the aforementioned range listed, please consider follow up with your Primary Care Provider.  If you are age 39 or younger, your body mass index should be between 19-25. Your Body mass index is 17.71 kg/m.  If this is out of the aformentioned range listed, please consider follow up with your Primary Care Provider.   ________________________________________________________  The [facility] providers would like to encourage you to use MYCHART to communicate with providers for non-urgent requests or questions.  Due to long hold times on the telephone, sending your provider a message by [facility] may be a faster and more efficient way to get a response.  Please allow 48 business hours for a response.  Please remember that this is for non-urgent requests.  _______________________________________________________

## 2024-04-16 ENCOUNTER — Telehealth: Payer: Self-pay

## 2024-04-16 NOTE — Telephone Encounter (Signed)
 I spoke to Erin [name] and I advised her that her cardiologist approved her hold on the Plavix  for 5 days prior to her procedure.

## 2024-04-16 NOTE — Telephone Encounter (Signed)
   Patient Name: Erin [name]  DOB: [birth year] MRN: [id number]  Primary Cardiologist: Arnoldo Lapping, MD  Chart reviewed as part of pre-operative protocol coverage.   From a cardiac perspective patient may hold Plavixfor 5 days prior to procedure. Ideally aspirin  would be continued through the perioperative period, if surgeon feels bleeding risk is to high aspirin  can be held for 7 days prior to procedure. Please resume Plavix  and aspirin  when safe to do so from a bleeding standpoint.    Francene Ing, Retha Cast, NP 04/16/2024, 8:55 AM

## 2024-04-16 NOTE — Telephone Encounter (Signed)
 [facility] Pre-operative Risk Assessment     Request for surgical clearance:     Endoscopy Procedure  What type of surgery is being performed?     Colonoscopy  When is this surgery scheduled?     04/26/24  What type of clearance is required ?   Pharmacy  Are there any medications that need to be held prior to surgery and how long? Plavix  5 days  Practice name and name of physician performing surgery?      [facility]  What is your office phone and fax number?      Phone- [phone]  Fax- [phone]  Anesthesia type (None, local, MAC, general) ?       MAC   Please route your response to [employer], CMA

## 2024-04-17 ENCOUNTER — Encounter: Payer: Self-pay | Admitting: Pediatrics

## 2024-04-19 ENCOUNTER — Other Ambulatory Visit: Payer: Self-pay | Admitting: Physician Assistant

## 2024-04-23 ENCOUNTER — Telehealth: Payer: Self-pay | Admitting: Pediatrics

## 2024-04-23 NOTE — Telephone Encounter (Signed)
 Patient called and stated that she is calling because she has noticed her fissure has cleared up and is set to have a colonoscopy scheduled for June the 6 th. Patient is complaining of having a Clear like gel  coming out of her rectum and she is stating that it smells bad. Patient also stated that she is having to clean herself up every 2-3 hours because of this gel. Patient is requesting a call back. Please advise.

## 2024-04-23 NOTE — Progress Notes (Unsigned)
 [facility] History and Physical   Primary Care Physician:  Aida House, MD   Reason for Procedure:  History of adenomatous colon polyps and sessile serrated polyps, anorectal pain  Plan:    Colonoscopy     HPI: Erin [name] is a 68 y.o. female undergoing B for follow-up of adenomatous colon polyps and sessile serrated polyps as well as anorectal pain.  Colonoscopies in 2019, 2020 and 2023 have shown numerous polyps that are tubular adenomas and sessile serrated polyps on pathology.  Last colonoscopy in 2023 disclosed 15 polyps.  Denies a family history of colorectal cancer or polyps.  She was seen in clinic recently complaining of anorectal pain.  There was evidence of a posterior anal fissure on physical exam.  Does have a history of prior anal cancer and documented to have an anal count deformity on prior colonoscopy.  Patient takes Plavix -last dose***   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/14 60.4Gy/10fx and chemo   Anxiety    Aortoiliac occlusive disease (HCC) 08/14/2017   B12 deficiency anemia 09/14/2015   Blood transfusion without reported diagnosis    BPPV (benign paroxysmal positional vertigo) 07/08/2015   Cardiomyopathy (HCC) 11/03/2017   Chronic back pain    Chronic daily headache 03/29/2013   takes bc powder   Closed right hip fracture (HCC) 09/10/2015   Coronary artery disease involving native coronary artery of native heart without angina pectoris 10/13/2017   DES to mid RCA   Depression    Essential (hemorrhagic) thrombocythemia (HCC) 09/06/2018   Essential hypertension 09/25/2023   Family history of early CAD 04/08/2016   GERD (gastroesophageal reflux disease)    History of hiatal hernia    Hot flashes    Hyperlipidemia    Hyperlipidemia associated with type 2 diabetes mellitus (HCC) 05/11/2017   Hypothyroidism 09/10/2015    IBS (irritable bowel syndrome) 03/29/2013   Neck pain 07/08/2015   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   takes neurotin   Peripheral vascular disease (HCC) 11/03/2017   QT prolongation    Sacroiliitis (HCC) 09/06/2018   Spasms of the hands or feet 10/08/2017   Syncope 06/07/2016   Tubular adenoma of colon 09/08/2003   Vertigo    Wears glasses     Past Surgical History:  Procedure Laterality Date   ABDOMINAL AORTOGRAM N/A 08/02/2017   Procedure: ABDOMINAL AORTOGRAM;  Surgeon: Wenona Hamilton, MD;  Location: [facility];  Service: Cardiovascular;  Laterality: N/A;   AORTA - BILATERAL FEMORAL ARTERY BYPASS GRAFT N/A 08/14/2017   Procedure: AORTA BIFEMORAL BYPASS GRAFT;  Surgeon: Mayo Speck, MD;  Location: [facility] OR;  Service: Vascular;  Laterality: N/A;   COLONOSCOPY     CORONARY STENT INTERVENTION N/A 10/13/2017   Procedure: CORONARY STENT INTERVENTION;  Surgeon: Sammy Crisp, MD;  Location: [facility];  Service: Cardiovascular;  Laterality: N/A;   dental implant     ECTOPIC PREGNANCY SURGERY     EMBOLECTOMY N/A 08/14/2017   Procedure: EMBOLECTOMY FEMORAL;  Surgeon: Mayo Speck, MD;  Location: [facility] OR;  Service: Vascular;  Laterality: N/A;   FEMORAL-POPLITEAL BYPASS GRAFT Right 08/14/2017   Procedure: Right Femoral to Above Knee Popliteal Bypass Graft using Non-Reversed Greater Saphenous Vein Graft from Right Leg;  Surgeon: Mayo Speck, MD;  Location: [facility] OR;  Service: Vascular;  Laterality: Right;   FLEXIBLE SIGMOIDOSCOPY N/A 08/14/2013  Procedure: FLEXIBLE SIGMOIDOSCOPY;  Surgeon: Asencion Blacksmith, MD;  Location: [facility];  Service: Endoscopy;  Laterality: N/A;   LEFT HEART CATH AND CORONARY ANGIOGRAPHY N/A 10/13/2017   Procedure: LEFT HEART CATH AND CORONARY ANGIOGRAPHY;  Surgeon: Sammy Crisp, MD;  Location: [facility];  Service: Cardiovascular;  Laterality: N/A;   LOWER EXTREMITY ANGIOGRAPHY Bilateral 08/02/2017   Procedure: Lower Extremity Angiography;   Surgeon: Wenona Hamilton, MD;  Location: [facility] INV[facility]IVE CV [facility];  Service: Cardiovascular;  Laterality: Bilateral;   MULTIPLE TOOTH EXTRACTIONS     PERIPHERAL VASCULAR INTERVENTION  05/22/2018   Procedure: PERIPHERAL VASCULAR INTERVENTION;  Surgeon: Margherita Shell, MD;  Location: [facility];  Service: Cardiovascular;;  SMA and Celiac   PILONIDAL CYST EXCISION     POLYPECTOMY     THROMBECTOMY FEMORAL ARTERY Right 08/14/2017   Procedure: THROMBECTOMY FEMORAL ARTERY;  Surgeon: Mayo Speck, MD;  Location: [facility] OR;  Service: Vascular;  Laterality: Right;   TONSILLECTOMY     TOTAL HIP ARTHROPLASTY  09/11/2015   Procedure: TOTAL HIP ARTHROPLASTY;  Surgeon: Saundra Curl, MD;  Location: [facility] OR;  Service: Orthopedics;;   ULTRASOUND GUIDANCE FOR VASCULAR ACCESS  10/13/2017   Procedure: Ultrasound Guidance For Vascular Access;  Surgeon: Sammy Crisp, MD;  Location: [facility];  Service: Cardiovascular;;   VISCERAL ANGIOGRAPHY N/A 03/03/2020   Procedure: MESTENRIC ANGIOGRAPHY;  Surgeon: Margherita Shell, MD;  Location: [facility];  Service: Cardiovascular;  Laterality: N/A;    Prior to Admission medications   Medication Sig Start Date End Date Taking? Authorizing Provider  acetaminophen  (TYLENOL ) 500 MG tablet Take 1,000 mg by mouth daily as needed for headache (pain).     [provider]  acyclovir  ointment (ZOVIRAX ) 5 % Apply 1 Application topically every 3 (three) hours as needed (fever blisters). 10/24/22   Aida House, MD  Cholecalciferol (VITAMIN D ) 2000 units tablet Take 2,000 Units by mouth daily.    [provider]  clopidogrel  (PLAVIX ) 75 MG tablet TAKE 1 TABLET BY MOUTH EVERY DAY 12/25/23   Young Hensen, MD  diltiazem  2 % GEL Apply 1 Application topically 2 (two) times daily at 10 am and 4 pm. 04/12/24   Shawan Corella, Scarlette Currier, MD  DULoxetine  (CYMBALTA ) 20 MG capsule TAKE 2-3 CAPSULES BY MOUTH EVERY DAY 02/26/24   Aida House, MD   fluticasone  (FLONASE ) 50 MCG/ACT nasal spray SPRAY 1 SPRAY INTO EACH NOSTRIL DAILY 02/17/24   Aida House, MD  gabapentin  (NEURONTIN ) 600 MG tablet TAKE 1 TABLET BY MOUTH THREE TIMES A DAY 01/19/24   Aida House, MD  ketoconazole  (NIZORAL ) 2 % shampoo Apply to hair daily for 7 days -- leave on for 5 minutes, then wash every other day for 7 days, then wash twice a week for 7 days,  then once a week. 04/01/24   Aida House, MD  levothyroxine  (SYNTHROID ) 75 MCG tablet Take 1 tablet (75 mcg total) by mouth daily before breakfast. TAKE 1 TABLETBY MOUTH DAILY BEFORE BREAKFAST MONDAY-FRIDAY, THEN 1/2 SATURDAY AND SUNDAY 02/29/24   Aida House, MD  meclizine  (ANTIVERT ) 25 MG tablet TAKE 1 TABLET BY MOUTH THREE TIMES A DAY AS NEEDED FOR DIZZINESS 01/19/24   Aida House, MD  metoprolol  succinate (TOPROL -XL) 25 MG 24 hr tablet TAKE 1 TABLET BY MOUTH TWICE A DAY 02/29/24   Weaver, Scott T, PA-C  nitroGLYCERIN  (NITROSTAT ) 0.4 MG SL tablet Place 1 tablet (0.4 mg  total) under the tongue every 5 (five) minutes as needed for chest pain. 04/03/24 07/02/24  End, Veryl Gottron, MD  nystatin -triamcinolone  (MYCOLOG II) cream Apply 1 application topically 2 (two) times daily as needed (dry skin).  03/30/16   [provider]  ondansetron  (ZOFRAN ) 8 MG tablet Take 1 tablet (8 mg total) by mouth every 8 (eight) hours as needed for nausea or vomiting. 01/23/24   Nafziger, Randel Buss, NP  pantoprazole  (PROTONIX ) 40 MG tablet TAKE 1 TABLET BY MOUTH TWICE A DAY 04/19/24   Collier, Amanda R, PA-C  potassium chloride  SA (KLOR-CON  M20) 20 MEQ tablet TAKE 1 TABLET BY MOUTH DAILY AS NEEDED (FOR FLUID OR EDEMA). 01/19/24   Aida House, MD  rOPINIRole  (REQUIP ) 1 MG tablet TAKE 1 TABLET (1 MG TOTAL) BY MOUTH AT BEDTIME. MAY INCREASE TO 2 TABLET AT BEDTIME AFTER 7 DAYS IF NEEDED. 03/22/24   Aida House, MD  rosuvastatin  (CRESTOR ) 40 MG tablet Take 1 tablet (40 mg total) by mouth daily. 02/26/24   Aida House, MD  tiZANidine (ZANAFLEX) 4 MG tablet Take 2 mg by mouth as needed for muscle spasms. 12/11/20   [provider]  triamcinolone  cream (KENALOG ) 0.1 % Apply 1 Application topically 2 (two) times daily. 03/29/24   Aida House, MD  vitamin B-12 1000 MCG tablet Take 1 tablet (1,000 mcg total) by mouth daily. 09/14/15   Dhungel, Delman Ferns, MD    Current Outpatient Medications  Medication Sig Dispense Refill   acetaminophen  (TYLENOL ) 500 MG tablet Take 1,000 mg by mouth daily as needed for headache (pain).      acyclovir  ointment (ZOVIRAX ) 5 % Apply 1 Application topically every 3 (three) hours as needed (fever blisters). 15 g 3   Cholecalciferol (VITAMIN D ) 2000 units tablet Take 2,000 Units by mouth daily.     clopidogrel  (PLAVIX ) 75 MG tablet TAKE 1 TABLET BY MOUTH EVERY DAY 90 tablet 1   diltiazem  2 % GEL Apply 1 Application topically 2 (two) times daily at 10 am and 4 pm. 30 g 1   DULoxetine  (CYMBALTA ) 20 MG capsule TAKE 2-3 CAPSULES BY MOUTH EVERY DAY 270 capsule 1   fluticasone  (FLONASE ) 50 MCG/ACT nasal spray SPRAY 1 SPRAY INTO EACH NOSTRIL DAILY 96 mL 1   gabapentin  (NEURONTIN ) 600 MG tablet TAKE 1 TABLET BY MOUTH THREE TIMES A DAY 90 tablet 3   ketoconazole  (NIZORAL ) 2 % shampoo Apply to hair daily for 7 days -- leave on for 5 minutes, then wash every other day for 7 days, then wash twice a week for 7 days,  then once a week. 120 mL 5   levothyroxine  (SYNTHROID ) 75 MCG tablet Take 1 tablet (75 mcg total) by mouth daily before breakfast. TAKE 1 TABLETBY MOUTH DAILY BEFORE BREAKFAST MONDAY-FRIDAY, THEN 1/2 SATURDAY AND SUNDAY 90 tablet 1   meclizine  (ANTIVERT ) 25 MG tablet TAKE 1 TABLET BY MOUTH THREE TIMES A DAY AS NEEDED FOR DIZZINESS 90 tablet 1   metoprolol  succinate (TOPROL -XL) 25 MG 24 hr tablet TAKE 1 TABLET BY MOUTH TWICE A DAY 180 tablet 2   nitroGLYCERIN  (NITROSTAT ) 0.4 MG SL tablet Place 1 tablet (0.4 mg total) under the tongue every 5 (five) minutes as needed  for chest pain. 25 tablet 3   nystatin -triamcinolone  (MYCOLOG II) cream Apply 1 application topically 2 (two) times daily as needed (dry skin).   2   ondansetron  (ZOFRAN ) 8 MG tablet Take 1 tablet (8 mg total) by mouth every 8 (eight) hours  as needed for nausea or vomiting. 20 tablet 1   pantoprazole  (PROTONIX ) 40 MG tablet TAKE 1 TABLET BY MOUTH TWICE A DAY 180 tablet 0   potassium chloride  SA (KLOR-CON  M20) 20 MEQ tablet TAKE 1 TABLET BY MOUTH DAILY AS NEEDED (FOR FLUID OR EDEMA). 90 tablet 1   rOPINIRole  (REQUIP ) 1 MG tablet TAKE 1 TABLET (1 MG TOTAL) BY MOUTH AT BEDTIME. MAY INCREASE TO 2 TABLET AT BEDTIME AFTER 7 DAYS IF NEEDED. 180 tablet 1   rosuvastatin  (CRESTOR ) 40 MG tablet Take 1 tablet (40 mg total) by mouth daily. 90 tablet 1   tiZANidine (ZANAFLEX) 4 MG tablet Take 2 mg by mouth as needed for muscle spasms.     triamcinolone  cream (KENALOG ) 0.1 % Apply 1 Application topically 2 (two) times daily. 80 g 3   vitamin B-12 1000 MCG tablet Take 1 tablet (1,000 mcg total) by mouth daily. 30 tablet 0   No current facility-administered medications for this visit.    Allergies as of 04/26/2024 - Review Complete 04/12/2024  Allergen Reaction Noted   Amitiza  [lubiprostone ] Diarrhea 06/06/2016   Nortriptyline  Other (See Comments) 06/06/2016   Sulfa antibiotics Nausea And Vomiting and Other (See Comments) 03/29/2013   Tramadol  Itching and Rash 12/01/2017   Atorvastatin  Nausea And Vomiting 03/09/2015   Claritin [loratadine] Other (See Comments) 03/29/2013    Family History  Problem Relation Age of Onset   Arthritis Mother    Hyperlipidemia Mother    Heart disease Mother    Hypertension Mother    Stroke Mother 73   Irritable bowel syndrome Mother    Thyroid  disease Mother    Heart attack Mother    Heart disease Father    Hyperlipidemia Father    Hypertension Father    Stroke Father 26   Thyroid  disease Father    Prostate cancer Father    Heart attack Father    Lung cancer  Brother 34   Heart attack Maternal Grandfather    Heart attack Maternal Uncle    Colon cancer Neg Hx    Rectal cancer Neg Hx    Stomach cancer Neg Hx    Esophageal cancer Neg Hx     Social History   Socioeconomic History   Marital status: Married    Spouse name: Not on file   Number of children: 0   Years of education: Not on file   Highest education level: Not on file  Occupational History   Occupation: caregiver  Tobacco Use   Smoking status: Former    Current packs/day: 0.00    Types: Cigarettes    Quit date: 10/07/2014    Years since quitting: 9.5   Smokeless tobacco: Never  Vaping Use   Vaping status: Never Used  Substance and Sexual Activity   Alcohol use: No    Alcohol/week: 0.0 standard drinks of alcohol   Drug use: No   Sexual activity: Yes    Partners: Male  Other Topics Concern   Not on file  Social History Narrative   Work or School: homemaker      Home Situation: lives with her husband,[name] takes care of her elderly parents       Spiritual Beliefs: Christian      Lifestyle: no regular exercise, poor diet      Right handed             Social Drivers of Health   Financial Resource Strain: Low Risk  (02/26/2024)   Overall Financial Resource Strain (CARDIA)  Difficulty of Paying Living Expenses: Not hard at all  Food Insecurity: No Food Insecurity (02/26/2024)   Hunger Vital Sign    Worried About Running Out of Food in the Last Year: Never true    Ran Out of Food in the Last Year: Never true  Transportation Needs: No Transportation Needs (02/26/2024)   PRAPARE - Administrator, Civil Service (Medical): No    Lack of Transportation (Non-Medical): No  Physical Activity: Inactive (02/26/2024)   Exercise Vital Sign    Days of Exercise per Week: 0 days    Minutes of Exercise per Session: 0 min  Stress: No Stress Concern Present (02/26/2024)   [employer] of Occupational Health - Occupational Stress Questionnaire    Feeling of Stress  : Not at all  Social Connections: Socially Integrated (02/26/2024)   Social Connection and Isolation Panel [NHANES]    Frequency of Communication with Friends and Family: More than three times a week    Frequency of Social Gatherings with Friends and Family: More than three times a week    Attends Religious Services: More than 4 times per year    Active Member of [employer] or Organizations: Yes    Attends Engineer, structural: More than 4 times per year    Marital Status: Married  Catering manager Violence: Not At Risk (02/26/2024)   Humiliation, Afraid, Rape, and Kick questionnaire    Fear of Current or Ex-Partner: No    Emotionally Abused: No    Physically Abused: No    Sexually Abused: No    Review of Systems:  All other review of systems negative except as mentioned in the HPI.  Physical Exam: Vital signs There were no vitals taken for this visit.  General:   Alert,  Well-developed, well-nourished, pleasant and cooperative in NAD Airway:  Mallampati  Lungs:  Clear throughout to auscultation.   Heart:  Regular rate and rhythm; no murmurs, clicks, rubs,  or gallops. Abdomen:  Soft, nontender and nondistended. Normal bowel sounds.   Neuro/Psych:  Normal mood and affect. A and O x 3  Eugenia Hess, MD [facility]

## 2024-04-23 NOTE — Telephone Encounter (Signed)
 Lm on vm for patient to return call

## 2024-04-23 NOTE — Telephone Encounter (Signed)
 Patient returned call. Patient states that she has been passing "slimy gel" since her office visit here. Patient reports that it is likely mucous, she has a hx of constipation and has not been evacuating completely. I informed patient that the body naturally produces more mucous when constipated to help move that stool through the colon. Patient continues to take 2 Emma daily probiotics in addition to Senna at bedtime. Patient passed a hard, medium sized stool this morning. Stool was not in pebbles. Patient has Miralax  at home and I advised her to begin this to help clean out her colon prior to her upcoming colonoscopy prep. Patient is aware that she should increase her fluid intake as well, can add Crystal Lite to water  if needed. Patient verbalized understanding and had no concerns at the end of the call.

## 2024-04-26 ENCOUNTER — Encounter: Payer: Self-pay | Admitting: Pediatrics

## 2024-04-26 ENCOUNTER — Ambulatory Visit: Admitting: Pediatrics

## 2024-04-26 VITALS — BP 186/75 | HR 71 | Temp 97.3°F | Resp 16 | Ht 63.0 in | Wt 100.0 lb

## 2024-04-26 DIAGNOSIS — Q439 Congenital malformation of intestine, unspecified: Secondary | ICD-10-CM | POA: Diagnosis not present

## 2024-04-26 DIAGNOSIS — E039 Hypothyroidism, unspecified: Secondary | ICD-10-CM | POA: Diagnosis not present

## 2024-04-26 DIAGNOSIS — D124 Benign neoplasm of descending colon: Secondary | ICD-10-CM

## 2024-04-26 DIAGNOSIS — Z860101 Personal history of adenomatous and serrated colon polyps: Secondary | ICD-10-CM | POA: Diagnosis not present

## 2024-04-26 DIAGNOSIS — D122 Benign neoplasm of ascending colon: Secondary | ICD-10-CM | POA: Diagnosis not present

## 2024-04-26 DIAGNOSIS — Z1211 Encounter for screening for malignant neoplasm of colon: Secondary | ICD-10-CM

## 2024-04-26 DIAGNOSIS — D125 Benign neoplasm of sigmoid colon: Secondary | ICD-10-CM | POA: Diagnosis not present

## 2024-04-26 DIAGNOSIS — K602 Anal fissure, unspecified: Secondary | ICD-10-CM | POA: Diagnosis not present

## 2024-04-26 DIAGNOSIS — K6289 Other specified diseases of anus and rectum: Secondary | ICD-10-CM | POA: Diagnosis not present

## 2024-04-26 DIAGNOSIS — D123 Benign neoplasm of transverse colon: Secondary | ICD-10-CM

## 2024-04-26 DIAGNOSIS — Z860100 Personal history of colon polyps, unspecified: Secondary | ICD-10-CM

## 2024-04-26 MED ORDER — SODIUM CHLORIDE 0.9 % IV SOLN
500.0000 mL | Freq: Once | INTRAVENOUS | Status: DC
Start: 2024-04-26 — End: 2024-04-26

## 2024-04-26 NOTE — Progress Notes (Signed)
 0905 BP 199/90, Labetalol  given IV, MD update, vss

## 2024-04-26 NOTE — Patient Instructions (Addendum)
 Thank you for letting us  care for your healthcare needs today! Please see handouts regarding Polyps. Await Pathology results. Referral sent to Dr. Brice Campi for removal of transverse colon polyp. - Resume Plavix  on Sunday, 04/28/24  YOU HAD AN ENDOSCOPIC PROCEDURE TODAY AT THE [facility]:   Refer to the procedure report that was given to you for any specific questions about what was found during the examination.  If the procedure report does not answer your questions, please call your gastroenterologist to clarify.  If you requested that your care partner not be given the details of your procedure findings, then the procedure report has been included in a sealed envelope for you to review at your convenience later.  YOU SHOULD EXPECT: Some feelings of bloating in the abdomen. Passage of more gas than usual.  Walking can help get rid of the air that was put into your GI tract during the procedure and reduce the bloating. If you had a lower endoscopy (such as a colonoscopy or flexible sigmoidoscopy) you may notice spotting of blood in your stool or on the toilet paper. If you underwent a bowel prep for your procedure, you may not have a normal bowel movement for a few days.  Please Note:  You might notice some irritation and congestion in your nose or some drainage.  This is from the oxygen used during your procedure.  There is no need for concern and it should clear up in a day or so.  SYMPTOMS TO REPORT IMMEDIATELY:  Following lower endoscopy (colonoscopy or flexible sigmoidoscopy):  Excessive amounts of blood in the stool  Significant tenderness or worsening of abdominal pains  Swelling of the abdomen that is new, acute  Fever of 100F or higher  For urgent or emergent issues, a gastroenterologist can be reached at any hour by calling [phone]. Do not use MyChart messaging for urgent concerns.    DIET:  We do recommend a small meal at first, but then you may proceed to your  regular diet.  Drink plenty of fluids but you should avoid alcoholic beverages for 24 hours.  ACTIVITY:  You should plan to take it easy for the rest of today and you should NOT DRIVE or use heavy machinery until tomorrow (because of the sedation medicines used during the test).    FOLLOW UP: Our staff will call the number listed on your records the next business day following your procedure.  We will call around 7:15- 8:00 am to check on you and address any questions or concerns that you may have regarding the information given to you following your procedure. If we do not reach you, we will leave a message.     If any biopsies were taken you will be contacted by phone or by letter within the next 1-3 weeks.  Please call us  at [phone] if you have not heard about the biopsies in 3 weeks.    SIGNATURES/CONFIDENTIALITY: You and/or your care partner have signed paperwork which will be entered into your electronic medical record.  These signatures attest to the fact that that the information above on your After Visit Summary has been reviewed and is understood.  Full responsibility of the confidentiality of this discharge information lies with you and/or your care-partner.

## 2024-04-26 NOTE — Op Note (Signed)
 [facility] Patient Name: Erin [name] Procedure Date: 04/26/2024 8:43 AM MRN: [id number] Endoscopist: Eugenia Hess , MD, [id number] Age: 68 Referring MD:  Date of Birth: [birth year] Gender: Female Account #: [id number] Procedure:                Colonoscopy Indications:              High risk colon cancer surveillance: Personal                            history of multiple (3 or more) adenomas, High risk                            colon cancer surveillance: Personal history of                            sessile serrated colon polyp (less than 10 mm in                            size) with no dysplasia, Last colonoscopy: August                            2023, patient reports anorectal pain and was                            diagnosed with recent anal fissure Procedure:                Pre-Anesthesia Assessment:                           - Prior to the procedure, a History and Physical                            was performed, and patient medications and                            allergies were reviewed. The patient's tolerance of                            previous anesthesia was also reviewed. The risks                            and benefits of the procedure and the sedation                            options and risks were discussed with the patient.                            All questions were answered, and informed consent                            was obtained. Prior Anticoagulants: The patient has                            taken Plavix  (clopidogrel ), last dose  was 6 days                            prior to procedure. ASA Grade Assessment: III - A                            patient with severe systemic disease. After                            reviewing the risks and benefits, the patient was                            deemed in satisfactory condition to undergo the                            procedure.                           After obtaining informed consent,  the colonoscope                            was passed under direct vision. Throughout the                            procedure, the patient's blood pressure, pulse, and                            oxygen saturations were monitored continuously. The                            PCF-H190TL Slim SN [id number] was introduced through                            the anus and advanced to the cecum, identified by                            appendiceal orifice and ileocecal valve. The                            colonoscopy was technically difficult and complex                            due to significant looping and a tortuous colon.                            Successful completion of the procedure was aided by                            using manual pressure and straightening and                            shortening the scope to obtain bowel loop  reduction. The patient tolerated the procedure. The                            quality of the bowel preparation was good. The                            ileocecal valve, appendiceal orifice, and rectum                            were photographed. Scope In: 8:56:45 AM Scope Out: 9:41:06 AM Scope Withdrawal Time: 0 hours 35 minutes 51 seconds  Total Procedure Duration: 0 hours 44 minutes 21 seconds  Findings:                 The perianal exam revealed erythematous tissue and                            scarring. Small anal fissure noted. Digital rectal                            exam with otherwise normal. The anal canal appeared                            erythematous upon intubation with the colonoscope.                           Three sessile polyps were found in the transverse                            colon. The polyps were 5 to 6 mm in size. These                            polyps were removed with a cold snare. Resection                            and retrieval were complete.                           Two sessile polyps were  found in the transverse                            colon. The polyps were 6 to 8 mm in size. These                            polyps were removed with a hot snare. Resection and                            retrieval were complete.                           A 10 mm polyp was found in the transverse colon.                            The polyp  was sessile. The polyp was partially                            removed with a hot snare. Polyp resection was                            incomplete. The resected tissue was retrieved.                            Location of the polyp was challenging especially in                            light of the patient's body habitus and looping of                            the colonoscope. Multiple attempts were made to                            resect the remainder of the polyp, however, the                            snare continue to slip off of the polyp and could                            not be removed. Injection with spot ink for tattoo                            marking was attempted near the polyp site, however,                            adequate raise could not be achieved.                           A tattoo was seen in the transverse colon placed                            during a prior colonoscopy. The tattoo site                            appeared normal.                           Three sessile polyps were found in the descending                            colon. The polyps were 5 to 6 mm in size. These                            polyps were removed with a cold snare. Resection                            and retrieval were complete.  A 4 mm polyp was found in the descending colon. The                            polyp was sessile. The polyp was removed with a                            cold biopsy forceps. Resection and retrieval were                            complete.                           A 5 mm polyp was found  in the sigmoid colon. The                            polyp was sessile. The polyp was removed with a                            cold snare. Resection and retrieval were complete.                           The rectum appeared narrow and anal canal had                            evidence of scarring. Retroflexion was not                            performed due to anatomy. Complications:            No immediate complications. Estimated blood loss:                            Minimal. Estimated Blood Loss:     Estimated blood loss was minimal. Impression:               - Three 5 to 6 mm polyps in the transverse colon,                            removed with a cold snare. Resected and retrieved.                           - Two 6 to 8 mm polyps in the transverse colon,                            removed with a hot snare. Resected and retrieved.                           - One 10 mm polyp in the transverse colon, removed                            with a hot snare. Incomplete resection. Resected  tissue retrieved.                           - A tattoo was seen in the transverse colon. The                            tattoo site appeared normal.                           - Three 5 to 6 mm polyps in the descending colon,                            removed with a cold snare. Resected and retrieved.                           - One 4 mm polyp in the descending colon, removed                            with a cold biopsy forceps. Resected and retrieved.                           - One 5 mm polyp in the sigmoid colon, removed with                            a cold snare. Resected and retrieved. Recommendation:           - Discharge patient to home (ambulatory).                           - Await pathology results.                           - Refer to Dr. Brice Campi for removal of transverse                            colon polyp that could not be adequately resected                             today.                           - Patient may resume Plavix  on 04/28/2024                           - The findings and recommendations were discussed                            with the patient's family.                           - Patient has a contact number available for                            emergencies. The signs and symptoms of potential  delayed complications were discussed with the                            patient. Return to normal activities tomorrow.                            Written discharge instructions were provided to the                            patient. Eugenia Hess, MD 04/26/2024 9:54:58 AM This report has been signed electronically.

## 2024-04-26 NOTE — Progress Notes (Signed)
 Called to room to assist during endoscopic procedure.  Patient ID and intended procedure confirmed with present staff. Received instructions for my participation in the procedure from the performing physician.

## 2024-04-26 NOTE — Progress Notes (Signed)
 Report given to PACU, vss

## 2024-04-26 NOTE — Progress Notes (Signed)
 0935 BP 188/90, Labetalol  given IV, MD update, vss

## 2024-04-26 NOTE — Progress Notes (Signed)
 BP 170/78, Labetalol  given IV, MD update, vss

## 2024-04-29 ENCOUNTER — Telehealth: Payer: Self-pay

## 2024-04-29 NOTE — Telephone Encounter (Signed)
  Follow up Call-     04/26/2024    8:05 AM 06/22/2022   12:59 PM  Call back number  Post procedure Call Back phone  # [phone]  Permission to leave phone message Yes Yes     Patient questions:  Do you have a fever, pain , or abdominal swelling? No. Pain Score  0 *  Have you tolerated food without any problems? Yes.    Have you been able to return to your normal activities? Yes.    Do you have any questions about your discharge instructions: Diet   No. Medications  No. Follow up visit  No.  Do you have questions or concerns about your Care? No.  Actions: * If pain score is 4 or above: No action needed, pain <4.

## 2024-04-30 ENCOUNTER — Telehealth: Payer: Self-pay

## 2024-04-30 NOTE — Telephone Encounter (Signed)
Recall has been entered  

## 2024-04-30 NOTE — Telephone Encounter (Signed)
-----   Message from [facility] sent at 04/29/2024  5:14 PM EDT ----- Regarding: RE: Colon polyp resection referral NM, Thank you for reaching out. I will be happy to be available for this patient's colonoscopy to remove the partially removed polyp. Why do not we plan the follow-up as you have outlined in 3 to 6 months. I do not think that I necessarily need to see her in clinic and she can be direct procedure in the hospital.  Tanis Hensarling, Please put a recall colonoscopy under my name in 3 to 6 months for hospital-based procedure 75-minute case.  Will need Plavix  hold. Thanks. GM ----- Message ----- From: Truddie Furrow, MD Sent: 04/29/2024  11:37 AM EDT To: Normie Becton., MD; [facility] Triage Subject: Colon polyp resection referral                 Hi Gabe/POD C -  I am touching base regarding a nonurgent colonoscopy referral that I would like to send you for resection of a colon polyp.  Ms. Dromgoole was a former patient of Dr. Emaline Handsome who always has multiple colon polyps and a somewhat challenging colonoscopy due to looping and body habitus.  I performed her colonoscopy last Friday and took out several polyps.  There was one 10 mm polyp in the transverse colon near a flexure that I only partially resected because of location and the snare continually slipping off of the polyp.  I attempted to place a tattoo near the polyp but could not obtain a good lift.  The residual polyp is of adequate size that you should be able to see it.  I spoke with patient and her husband after the procedure and they are aware of need for follow-up colonoscopy.  This is a nonemergent procedure that can be scheduled within 3 to 6 months.  Patient takes Plavix  and will need a 5-day hold which she has done fine with for previous procedures.  Please let me know if you have any questions.  Best,  Haskell Linker

## 2024-05-01 ENCOUNTER — Ambulatory Visit: Payer: Self-pay | Admitting: Pediatrics

## 2024-05-01 ENCOUNTER — Other Ambulatory Visit: Payer: Self-pay | Admitting: Family Medicine

## 2024-05-01 LAB — SURGICAL PATHOLOGY

## 2024-05-03 ENCOUNTER — Other Ambulatory Visit: Payer: Self-pay

## 2024-05-03 ENCOUNTER — Emergency Department (HOSPITAL_COMMUNITY)
Admission: EM | Admit: 2024-05-03 | Discharge: 2024-05-03 | Disposition: A | Discharge status: Home / Self Care | Attending: Emergency Medicine | Admitting: Emergency Medicine

## 2024-05-03 ENCOUNTER — Encounter (HOSPITAL_COMMUNITY): Payer: Self-pay

## 2024-05-03 ENCOUNTER — Emergency Department (HOSPITAL_COMMUNITY)

## 2024-05-03 DIAGNOSIS — I10 Essential (primary) hypertension: Secondary | ICD-10-CM | POA: Diagnosis not present

## 2024-05-03 DIAGNOSIS — R112 Nausea with vomiting, unspecified: Secondary | ICD-10-CM | POA: Diagnosis not present

## 2024-05-03 DIAGNOSIS — E039 Hypothyroidism, unspecified: Secondary | ICD-10-CM | POA: Insufficient documentation

## 2024-05-03 DIAGNOSIS — Z85048 Personal history of other malignant neoplasm of rectum, rectosigmoid junction, and anus: Secondary | ICD-10-CM | POA: Diagnosis not present

## 2024-05-03 DIAGNOSIS — I2510 Atherosclerotic heart disease of native coronary artery without angina pectoris: Secondary | ICD-10-CM | POA: Insufficient documentation

## 2024-05-03 DIAGNOSIS — Z7902 Long term (current) use of antithrombotics/antiplatelets: Secondary | ICD-10-CM | POA: Insufficient documentation

## 2024-05-03 DIAGNOSIS — Z79899 Other long term (current) drug therapy: Secondary | ICD-10-CM | POA: Diagnosis not present

## 2024-05-03 DIAGNOSIS — R519 Headache, unspecified: Secondary | ICD-10-CM | POA: Diagnosis not present

## 2024-05-03 LAB — CBC
HCT: 37.9 % (ref 36.0–46.0)
Hemoglobin: 12 g/dL (ref 12.0–15.0)
MCH: 31.2 pg (ref 26.0–34.0)
MCHC: 31.7 g/dL (ref 30.0–36.0)
MCV: 98.4 fL (ref 80.0–100.0)
Platelets: 347 10*3/uL (ref 150–400)
RBC: 3.85 MIL/uL — ABNORMAL LOW (ref 3.87–5.11)
RDW: 13.4 % (ref 11.5–15.5)
WBC: 7.3 10*3/uL (ref 4.0–10.5)
nRBC: 0 % (ref 0.0–0.2)

## 2024-05-03 MED ORDER — KETOROLAC TROMETHAMINE 15 MG/ML IJ SOLN
15.0000 mg | Freq: Once | INTRAMUSCULAR | Status: AC
  Administered 2024-05-03: 15 mg via INTRAVENOUS
  Filled 2024-05-03: qty 1

## 2024-05-03 MED ORDER — PROCHLORPERAZINE EDISYLATE 10 MG/2ML IJ SOLN
5.0000 mg | Freq: Once | INTRAMUSCULAR | Status: AC
  Administered 2024-05-03: 5 mg via INTRAVENOUS
  Filled 2024-05-03: qty 2

## 2024-05-03 MED ORDER — PROCHLORPERAZINE EDISYLATE 10 MG/2ML IJ SOLN
5.0000 mg | Freq: Once | INTRAMUSCULAR | Status: AC
  Administered 2024-05-03: 5 mg via INTRAMUSCULAR
  Filled 2024-05-03: qty 2

## 2024-05-03 NOTE — ED Provider Notes (Signed)
 [place] EMERGENCY DEPARTMENT AT [facility] Provider Note   CSN: [id number] Arrival date & time: 05/03/24  1025     Patient presents with: Headache   Erin [name] is a 68 y.o. female.    Headache Patient with acute on chronic headaches.  Reportedly has daily headaches but worse the last week since getting colonoscopy.  Diffuse tightness.  States she has never had it a CAT scan or MRI images.  States she takes Tylenol  daily which tends to help.  Worse pain when she wakes up in the morning.  Some nausea and vomiting today.  States pain was worse since around 3 in the morning.    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/14 60.4Gy/8fx and chemo   Anxiety    Aortoiliac occlusive disease (HCC) 08/14/2017   Arthritis    B12 deficiency anemia 09/14/2015   Blood transfusion without reported diagnosis    BPPV (benign paroxysmal positional vertigo) 07/08/2015   Cardiomyopathy (HCC) 11/03/2017   Chronic back pain    Chronic daily headache 03/29/2013   takes bc powder   Closed right hip fracture (HCC) 09/10/2015   Coronary artery disease involving native coronary artery of native heart without angina pectoris 10/13/2017   DES to mid RCA   Depression    Essential (hemorrhagic) thrombocythemia (HCC) 09/06/2018   Essential hypertension 09/25/2023   Family history of early CAD 04/08/2016   GERD (gastroesophageal reflux disease)    History of hiatal hernia    Hot flashes    Hyperlipidemia    Hyperlipidemia associated with type 2 diabetes mellitus (HCC) 05/11/2017   Hypothyroidism 09/10/2015   IBS (irritable bowel syndrome) 03/29/2013   Neck pain 07/08/2015   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   takes neurotin   Peripheral vascular disease (HCC) 11/03/2017   QT prolongation    Sacroiliitis (HCC) 09/06/2018   Spasms of the hands or feet 10/08/2017   Syncope  06/07/2016   Tubular adenoma of colon 09/08/2003   Vertigo    Wears glasses     Prior to Admission medications   Medication Sig Start Date End Date Taking? Authorizing Provider  acetaminophen  (TYLENOL ) 500 MG tablet Take 1,000 mg by mouth daily as needed for headache (pain).     [provider]  acyclovir  ointment (ZOVIRAX ) 5 % Apply 1 Application topically every 3 (three) hours as needed (fever blisters). 10/24/22   Aida House, MD  Cholecalciferol (VITAMIN D ) 2000 units tablet Take 2,000 Units by mouth daily.    [provider]  clopidogrel  (PLAVIX ) 75 MG tablet TAKE 1 TABLET BY MOUTH EVERY DAY 12/25/23   Young Hensen, MD  diltiazem  2 % GEL Apply 1 Application topically 2 (two) times daily at 10 am and 4 pm. 04/12/24   McGreal, Scarlette Currier, MD  DULoxetine  (CYMBALTA ) 20 MG capsule TAKE 2-3 CAPSULES BY MOUTH EVERY DAY 02/26/24   Aida House, MD  fluticasone  (FLONASE ) 50 MCG/ACT nasal spray SPRAY 1 SPRAY INTO EACH NOSTRIL DAILY 02/17/24   Aida House, MD  gabapentin  (NEURONTIN ) 600 MG tablet TAKE 1 TABLET BY MOUTH THREE TIMES A DAY 01/19/24   Aida House, MD  ketoconazole  (NIZORAL ) 2 % shampoo Apply to hair daily for 7 days -- leave on for 5 minutes, then wash every other day for 7 days, then wash twice a week for 7 days,  then  once a week. 04/01/24   Aida House, MD  levothyroxine  (SYNTHROID ) 75 MCG tablet Take 1 tablet (75 mcg total) by mouth daily before breakfast. TAKE 1 TABLETBY MOUTH DAILY BEFORE BREAKFAST MONDAY-FRIDAY, THEN 1/2 SATURDAY AND SUNDAY 02/29/24   Aida House, MD  meclizine  (ANTIVERT ) 25 MG tablet TAKE 1 TABLET BY MOUTH THREE TIMES A DAY AS NEEDED FOR DIZZINESS 05/01/24   Aida House, MD  metoprolol  succinate (TOPROL -XL) 25 MG 24 hr tablet TAKE 1 TABLET BY MOUTH TWICE A DAY 02/29/24   Weaver, Scott T, PA-C  nitroGLYCERIN  (NITROSTAT ) 0.4 MG SL tablet Place 1 tablet (0.4 mg total) under the tongue every 5 (five) minutes as  needed for chest pain. 04/03/24 07/02/24  End, Veryl Gottron, MD  nystatin -triamcinolone  (MYCOLOG II) cream Apply 1 application topically 2 (two) times daily as needed (dry skin).  03/30/16   [provider]  ondansetron  (ZOFRAN ) 8 MG tablet Take 1 tablet (8 mg total) by mouth every 8 (eight) hours as needed for nausea or vomiting. 01/23/24   Nafziger, Randel Buss, NP  pantoprazole  (PROTONIX ) 40 MG tablet TAKE 1 TABLET BY MOUTH TWICE A DAY 04/19/24   Collier, Amanda R, PA-C  potassium chloride  SA (KLOR-CON  M20) 20 MEQ tablet TAKE 1 TABLET BY MOUTH DAILY AS NEEDED (FOR FLUID OR EDEMA). 01/19/24   Aida House, MD  rOPINIRole  (REQUIP ) 1 MG tablet TAKE 1 TABLET (1 MG TOTAL) BY MOUTH AT BEDTIME. MAY INCREASE TO 2 TABLET AT BEDTIME AFTER 7 DAYS IF NEEDED. 03/22/24   Aida House, MD  rosuvastatin  (CRESTOR ) 40 MG tablet Take 1 tablet (40 mg total) by mouth daily. 02/26/24   Aida House, MD  tiZANidine (ZANAFLEX) 4 MG tablet Take 2 mg by mouth as needed for muscle spasms. 12/11/20   [provider]  triamcinolone  cream (KENALOG ) 0.1 % Apply 1 Application topically 2 (two) times daily. 03/29/24   Aida House, MD  vitamin B-12 1000 MCG tablet Take 1 tablet (1,000 mcg total) by mouth daily. 09/14/15   Dhungel, Delman Ferns, MD    Allergies: Amitiza  [lubiprostone ], Nortriptyline , Sulfa antibiotics, Tramadol , Atorvastatin , and Claritin [loratadine]    Review of Systems  Neurological:  Positive for headaches.    Updated Vital Signs BP (!) 214/89   Pulse 65   Temp 97.7 F (36.5 C) (Oral)   Resp (!) 22   SpO2 100%   Physical Exam Vitals and nursing note reviewed.  Constitutional:      Comments: Patient came into the ER wearing sunglasses   Eyes:     Pupils: Pupils are equal, round, and reactive to light.    Cardiovascular:     Rate and Rhythm: Regular rhythm.  Pulmonary:     Breath sounds: No wheezing or rhonchi.   Musculoskeletal:     Cervical back: Neck supple.    Neurological:     Mental Status: She is alert.     (all labs ordered are listed, but only abnormal results are displayed) Labs Reviewed  CBC - Abnormal; Notable for the following components:      Result Value   RBC 3.85 (*)    All other components within normal limits  BASIC METABOLIC PANEL WITH GFR    EKG: None  Radiology: CT HEAD WO CONTRAST ( ) Result Date: 05/03/2024 CLINICAL DATA:  Headache. EXAM: CT HEAD WITHOUT CONTRAST TECHNIQUE: Contiguous axial images were obtained from the base of the skull through the vertex without intravenous contrast. RADIATION DOSE REDUCTION: This exam was performed  according to the departmental dose-optimization program which includes automated exposure control, adjustment of the mA and/or kV according to patient size and/or use of iterative reconstruction technique. COMPARISON:  None Available. FINDINGS: Brain: No evidence of acute infarction, hemorrhage, hydrocephalus, extra-axial collection or mass lesion/mass effect. Vascular: There are calcifications present within the carotid siphons. Skull: Intact and unremarkable. Sinuses/Orbits: Unremarkable. Other: None. IMPRESSION: Normal brain. Electronically Signed   By: Maribeth Shivers M.D.   On: 05/03/2024 12:13     Procedures   Medications Ordered in the ED  prochlorperazine  (COMPAZINE ) injection 5 mg (has no administration in time range)  prochlorperazine  (COMPAZINE ) injection 5 mg (5 mg Intravenous Given 05/03/24 1213)  ketorolac  (TORADOL ) 15 MG/ML injection 15 mg (15 mg Intravenous Given 05/03/24 1214)                                    Medical Decision Making Amount and/or Complexity of Data Reviewed Labs: ordered. Radiology: ordered.  Risk Prescription drug management.   Patient with acute on chronic headache.  Worse today.  Is on Plavix .  No fevers.  Meningitis felt less likely.  Will get CT scan to evaluate.  Will get basic blood work and treat with migraine cocktail.  On  reexamination headache is feeling much better.  Eager to go home.  However waiting on blood work.  Also with some hypertension but I think is more likely from the headache as opposed to the cause of it.  Has seen Dr. Ty Gales previously for headaches.  Workup reassuring.  Base metabolic had not resulted.  Patient however feeling better wanted to go home.  Headache had been doing much better.  However did have recurrence.  Requesting just another dose of IM medicine before she goes.  Will discharge to follow-up with her neurologist.  Does have daily headaches but reassuring CT here.      Final diagnoses:  Acute nonintractable headache, unspecified headache type    ED Discharge Orders     None          Mozell Arias, MD 05/03/24 1444

## 2024-05-03 NOTE — Discharge Instructions (Signed)
 Follow-up with neurology

## 2024-05-03 NOTE — ED Triage Notes (Signed)
 Pt reports this is the worst headache she has ever had, ongoing since a colonoscopy a week ago but the worst its been this morning. +nausea and dry heaves. Pupils 4 equal

## 2024-05-08 ENCOUNTER — Telehealth: Payer: Self-pay | Admitting: Family Medicine

## 2024-05-08 NOTE — Telephone Encounter (Signed)
 Copied from CRM [id number]. Topic: Referral - Question >> May 08, 2024  4:15 PM Earnestine Goes B wrote: Reason for CRM: Pt called to request to change her referral for a surgeon to [facility] [phone] please all pt with a update .

## 2024-05-09 ENCOUNTER — Telehealth: Payer: Self-pay | Admitting: *Deleted

## 2024-05-09 DIAGNOSIS — R519 Headache, unspecified: Secondary | ICD-10-CM

## 2024-05-10 NOTE — Telephone Encounter (Signed)
 There is nothing in this message that I can see or click on

## 2024-05-13 ENCOUNTER — Telehealth: Payer: Self-pay | Admitting: *Deleted

## 2024-05-13 NOTE — Telephone Encounter (Signed)
 Patient was advised a prior note was sent to PCP and we will contact her with further information once reviewed.

## 2024-05-13 NOTE — Telephone Encounter (Signed)
 Copied from CRM [id number]. Topic: Referral - Status >> May 13, 2024 11:16 AM Revonda D wrote: Reason for CRM: Pt is calling to check the status of her referral for  Dr.Donika Tobie at [facility]. [address] [place], [place] [address] [phone]. Pt would like a callback today with an update.

## 2024-05-13 NOTE — Telephone Encounter (Signed)
 Copied from CRM [id number]. Topic: Clinical - Medication Question >> May 13, 2024 11:08 AM Revonda D wrote: Reason for CRM: Pt stated that she has switch pharmacies and now wants her medications sent to [id number] [address] [place], [place] - [address].

## 2024-05-13 NOTE — Telephone Encounter (Signed)
 Spoke with the patient to inquire which medications she needed refills on.  Patient stated she does not need any refills at this time and just wanted this pharmacy to be listed on [facility] chart [facility] [place], [place]. This pharmacy was deleted from pharmacy preferences and patient was also advised since [facility], if needed Rxs could be transferred from any one location to the other.

## 2024-05-14 NOTE — Telephone Encounter (Signed)
 Ok to place referral to neurology for headaches.

## 2024-05-14 NOTE — Addendum Note (Signed)
 Addended by: CHRISTYNE AMBLE A on: 05/14/2024 10:14 AM   Modules accepted: Orders

## 2024-05-14 NOTE — Telephone Encounter (Signed)
 Patient informed the referral was placed to neurology with her preference of Dr Tobie and someone will contact her with further appt info.

## 2024-05-17 ENCOUNTER — Encounter: Payer: Self-pay | Admitting: Neurology

## 2024-05-17 ENCOUNTER — Encounter: Payer: Self-pay | Admitting: Family Medicine

## 2024-05-17 ENCOUNTER — Ambulatory Visit (INDEPENDENT_AMBULATORY_CARE_PROVIDER_SITE_OTHER): Admitting: Family Medicine

## 2024-05-17 VITALS — BP 130/80 | HR 76 | Temp 98.5°F | Ht 63.0 in | Wt 98.3 lb

## 2024-05-17 DIAGNOSIS — G43709 Chronic migraine without aura, not intractable, without status migrainosus: Secondary | ICD-10-CM | POA: Diagnosis not present

## 2024-05-17 MED ORDER — METOCLOPRAMIDE HCL 5 MG PO TABS: 5.0000 mg | ORAL_TABLET | Freq: Four times a day (QID) | ORAL | 0 refills | Status: DC | PRN

## 2024-05-17 MED ORDER — TOPIRAMATE 50 MG PO TABS: ORAL_TABLET | ORAL | 5 refills | Status: DC

## 2024-05-17 MED ORDER — SUMATRIPTAN SUCCINATE 50 MG PO TABS: 50.0000 mg | ORAL_TABLET | Freq: Every day | ORAL | 5 refills | Status: DC | PRN

## 2024-05-17 NOTE — Progress Notes (Unsigned)
 Established Patient Office Visit  Subjective   Patient ID: Erin [name], female    DOB: 29-Oct-1956  Age: 68 y.o. MRN: [id number]  Chief Complaint  Patient presents with  . Hospitalization Follow-up    Patient presents for ER follow up, given IV medication also pain injeciton and nausea medication, complains of recurrent headaches    Pt was seen in the ED for intractable headaches and she is here for follow up. She reports that her CT scan was normal. States they gave her medicine in her IV and she felt a little better but the headaches returned. Has appt with Dr. Tobie in November, states she is on the cancellation list to try and get in sooner. Has extreme light sensitivity and nausea/vomiting with her headaches, has been sleeping in a dark room and laying cold washcloths over her eyes. States that tylenol  is not helping with her pain.    Current Outpatient Medications  Medication Instructions  . acetaminophen  (TYLENOL ) 1,000 mg, Daily PRN  . acyclovir  ointment (ZOVIRAX ) 5 % 1 Application, Topical, Every  3 hours PRN  . clopidogrel  (PLAVIX ) 75 mg, Oral, Daily  . cyanocobalamin  1,000 mcg, Oral, Daily  . diltiazem  2 % GEL 1 Application, Topical, 2 times daily at 10am and 4pm  . DULoxetine  (CYMBALTA ) 20 MG capsule TAKE 2-3 CAPSULES BY MOUTH EVERY DAY  . fluticasone  (FLONASE ) 50 MCG/ACT nasal spray SPRAY 1 SPRAY INTO EACH NOSTRIL DAILY  . gabapentin  (NEURONTIN ) 600 mg, Oral, 3 times daily  . ketoconazole  (NIZORAL ) 2 % shampoo Apply to hair daily for 7 days -- leave on for 5 minutes, then wash every other day for 7 days, then wash twice a week for 7 days,  then once a week.  . levothyroxine  (SYNTHROID ) 75 mcg, Oral, Daily before breakfast, TAKE 1 TABLETBY MOUTH DAILY BEFORE BREAKFAST MONDAY-FRIDAY, THEN 1/2 SATURDAY AND SUNDAY  . meclizine  (ANTIVERT ) 25 MG tablet TAKE 1 TABLET BY MOUTH THREE TIMES A DAY AS NEEDED FOR DIZZINESS  . metoCLOPramide  (REGLAN ) 5 mg, Oral, Every 6 hours PRN   . metoprolol  succinate (TOPROL -XL) 25 mg, Oral, 2 times daily  . nitroGLYCERIN  (NITROSTAT ) 0.4 mg, Sublingual, Every 5 min PRN  . nystatin -triamcinolone  (MYCOLOG II) cream 1 application , 2 times daily PRN  . ondansetron  (ZOFRAN ) 8 mg, Oral, Every 8 hours PRN  . pantoprazole  (PROTONIX ) 40 mg, Oral, 2 times daily  . potassium chloride  SA (KLOR-CON  M20) 20 MEQ tablet TAKE 1 TABLET BY MOUTH DAILY AS NEEDED (FOR FLUID OR EDEMA).  . rOPINIRole  (REQUIP ) 1 mg, Oral, Daily at bedtime, May Increase to 2 tablet at bedtime after 7 days if needed.  . rosuvastatin  (CRESTOR ) 40 mg, Oral, Daily  . SUMAtriptan (IMITREX) 50 mg, Oral, Daily PRN, May repeat in 2 hours if headache persists or recurs. Do not exceed 2 tablet in 24 hours  . tiZANidine (ZANAFLEX) 2 mg, As needed  . topiramate (TOPAMAX) 50 MG tablet Take 0.5 tablets (25 mg total) by mouth at bedtime for 4 days, THEN 1 tablet (50 mg total) at bedtime for 4 days, THEN 1 tablet (50 mg total) 2 (two) times daily.  . triamcinolone  cream (KENALOG ) 0.1 % 1 Application, Topical, 2 times daily  . Vitamin D  2,000 Units, Daily    Patient Active Problem List   Diagnosis Date Noted  . AKI (acute kidney injury) (HCC) 01/14/2024  . Essential hypertension 09/25/2023  . Restless leg syndrome 04/20/2023  . Preoperative cardiovascular examination 05/10/2022  . HFimpEF (heart failure  with improved EF) 05/10/2022  . Mesenteric artery stenosis (HCC) 03/03/2020  . 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 Hyperlipidemia 02/12/2020  . Cardiomyopathy (HCC) 11/03/2017  . Peripheral vascular disease (HCC) 11/03/2017  . Coronary artery disease involving native coronary artery of native heart without angina pectoris 10/13/2017  . Aortoiliac occlusive disease (HCC) 08/14/2017  . Hypokalemia 06/06/2016  . Family history of early CAD 04/08/2016  . Elevated glucose 03/18/2016  . B12 deficiency anemia 09/14/2015  . Hypothyroidism 09/10/2015  . GERD  without esophagitis 09/10/2015  . BPPV (benign paroxysmal positional vertigo) 07/08/2015  . Neck pain 07/08/2015  . Anal cancer (HCC) 08/19/2013  . IBS (irritable bowel syndrome) 03/29/2013  . Chronic daily headache 03/29/2013  . 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      Review of Systems  All other systems reviewed and are negative.     Objective:     BP 130/80   Pulse 76   Temp 98.5 F (36.9 C) (Oral)   Ht 5' 3 (1.6 m)   Wt 98 lb 4.8 oz (44.6 kg)   SpO2 98%   BMI 17.41 kg/m  {Vitals History (Optional):23777}  Physical Exam Vitals reviewed.     No results found for any visits on 05/17/24.  {Labs (Optional):23779}  The 10-year ASCVD risk score (Arnett DK, et al., 2019) is: 16.1%    Assessment & Plan:  Chronic migraine without aura without status migrainosus, not intractable -     SUMAtriptan Succinate; Take 1 tablet (50 mg total) by mouth daily as needed for migraine. May repeat in 2 hours if headache persists or recurs. Do not exceed 2 tablet in 24 hours  Dispense: 10 tablet; Refill: 5 -     Metoclopramide  HCl; Take 1 tablet (5 mg total) by mouth every 6 (six) hours as needed for nausea.  Dispense: 60 tablet; Refill: 0 -     Topiramate; Take 0.5 tablets (25 mg total) by mouth at bedtime for 4 days, THEN 1 tablet (50 mg total) at bedtime for 4 days, THEN 1 tablet (50 mg total) 2 (two) times daily.  Dispense: 60 tablet; Refill: 5     No follow-ups on file.    Heron [name] Sharper, MD

## 2024-05-20 ENCOUNTER — Ambulatory Visit: Payer: Self-pay

## 2024-05-20 DIAGNOSIS — G43719 Chronic migraine without aura, intractable, without status migrainosus: Secondary | ICD-10-CM

## 2024-05-20 MED ORDER — NURTEC 75 MG PO TBDP: 1.0000 | ORAL_TABLET | Freq: Every day | ORAL | 0 refills | Status: DC | PRN

## 2024-05-20 NOTE — Telephone Encounter (Signed)
 Left a detailed message with the information below at the patient's cell number.

## 2024-05-20 NOTE — Telephone Encounter (Signed)
 I had started her on topiramate as well and this will take some time to start working -- I called in a few tablets of Nurtec to her pharmacy to see if this will help,. Appt not needed since I just saw her last week

## 2024-05-20 NOTE — Telephone Encounter (Signed)
 FYI Only or Action Required?: Action required by provider: clinical question for provider.  Patient was last seen in primary care on 05/17/2024 by [name] HERO, MD. Called Nurse Triage reporting Headache. Symptoms began several weeks ago. Interventions attempted: Prescription medications: Imitrex. Symptoms are: gradually worsening.  Triage Disposition: See PCP When Office is Open (Within 3 Days)  Patient/caregiver understands and will follow disposition?: YesCopied from CRM [id number]. Topic: Clinical - Prescription Issue >> May 20, 2024  9:18 AM Willma SAUNDERS wrote: Reason for CRM: Patient states she was in on Friday to be seen for her headaches. Was prescribed some medication. Says she thinks the SUMAtriptan (IMITREX) 50 MG tablet is making them worse. Took it three times and feels like the pain moves to the front/temples of her head. Did not take it yesterday and its a little better today but still hurts. Patient would like to know if there is another option.  Patient can be reached at [phone] Reason for Disposition  [1] MILD-MODERATE headache AND [2] present > 72 hours  Answer Assessment - Initial Assessment Questions 1. LOCATION: Where does it hurt?      Whole head 2. ONSET: When did the headache start? (Minutes, hours or days)      Afternoon  3. PATTERN: Does the pain come and go, or has it been constant since it started?     Constant  4. SEVERITY: How bad is the pain? and What does it keep you from doing?  (e.g., Scale 1-10; mild, moderate, or severe)   - MILD (1-3): doesn't interfere with normal activities    - MODERATE (4-7): interferes with normal activities or awakens from sleep    - SEVERE (8-10): excruciating pain, unable to do any normal activities        severe 5. RECURRENT SYMPTOM: Have you ever had headaches before? If Yes, ask: When was the last time? and What happened that time?      Yes 6. CAUSE: What do you think is causing the headache?     Not  sure 7. MIGRAINE: Have you been diagnosed with migraine headaches? If Yes, ask: Is this headache similar?      Years ago  8. HEAD INJURY: Has there been any recent injury to the head?      Trama behind eyes 9. OTHER SYMPTOMS: Do you have any other symptoms? (fever, stiff neck, eye pain, sore throat, cold symptoms)     Neck pain and throat pain    Whole head hurt and used heating/rice bag to wrap around head. Pt has trama behind eyes but not sure how Pt takes tylenol  for pain and muscle relaxer for back. Pt is waiting to be seen by neurologist. Appt is in November, but on wait list. Pt is asking for help but didn't want to make appt at this time. Pt feels Imitrex makes headache worse and would like a different medication.  Pt feels Ropinirole  has helped and wants to have it increased. Please advise.  Protocols used: [facility]

## 2024-05-21 ENCOUNTER — Telehealth: Payer: Self-pay

## 2024-05-21 DIAGNOSIS — G43719 Chronic migraine without aura, intractable, without status migrainosus: Secondary | ICD-10-CM

## 2024-05-21 MED ORDER — NURTEC 75 MG PO TBDP: 1.0000 | ORAL_TABLET | Freq: Every day | ORAL | 0 refills | Status: DC | PRN

## 2024-05-21 NOTE — Telephone Encounter (Signed)
 Copied from CRM [id number]. Topic: Clinical - Prescription Issue >> May 21, 2024 11:21 AM Berneda FALCON wrote: Reason for CRM:  Pt states that the pharmacy states they did not get the order for this medication, however I do see it was sent on 05/20/2024.  Rimegepant Sulfate (NURTEC) 75 MG TBDP  [id number] [address], [place] - [address] [place] [address] Phone: [phone] Fax: [phone] Hours: Not open 24 hours  Please call patient back when this is sorted out [phone]

## 2024-05-21 NOTE — Addendum Note (Signed)
 Addended by: CHRISTYNE IDELL LABOR on: 05/21/2024 01:26 PM   Modules accepted: Orders

## 2024-05-21 NOTE — Telephone Encounter (Signed)
 Sp[facility] at [facility] as it appears the Rx was phoned in and she stated there is no record of this.  Rx was re-sent via escribe and she stated they will contact the patient when the Rx is ready for pick up.

## 2024-05-21 NOTE — Telephone Encounter (Signed)
 Pharmacy Patient Advocate Encounter   Received notification from CoverMyMeds that prior authorization for Nurtec 75MG  dispersible tablets is required/requested.   Insurance verification completed.   The patient is insured through [facility] .   Per test claim: PA required; PA submitted to above mentioned insurance via CoverMyMeds Key/confirmation #/EOC [id number] Status is pending

## 2024-05-22 ENCOUNTER — Other Ambulatory Visit (HOSPITAL_COMMUNITY): Payer: Self-pay

## 2024-05-27 ENCOUNTER — Other Ambulatory Visit (HOSPITAL_COMMUNITY): Payer: Self-pay

## 2024-05-27 DIAGNOSIS — M65311 Trigger thumb, right thumb: Secondary | ICD-10-CM | POA: Diagnosis not present

## 2024-05-27 NOTE — Telephone Encounter (Signed)
 Pharmacy Patient Advocate Encounter  Received notification from [employer] that Prior Authorization for has been APPROVED from 05/21/2024 to 11/20/2024. Unable to obtain price due to refill too soon rejection, last fill date 05/22/2024 next available fill date07/14/2025   PA #/Case ID/Reference #: [id number]

## 2024-05-29 ENCOUNTER — Other Ambulatory Visit: Payer: Self-pay | Admitting: Family Medicine

## 2024-05-29 DIAGNOSIS — G63 Polyneuropathy in diseases classified elsewhere: Secondary | ICD-10-CM

## 2024-05-31 ENCOUNTER — Ambulatory Visit

## 2024-06-03 ENCOUNTER — Telehealth: Payer: Self-pay | Admitting: Physician Assistant

## 2024-06-03 DIAGNOSIS — H43391 Other vitreous opacities, right eye: Secondary | ICD-10-CM | POA: Diagnosis not present

## 2024-06-03 DIAGNOSIS — H524 Presbyopia: Secondary | ICD-10-CM | POA: Diagnosis not present

## 2024-06-03 DIAGNOSIS — H2511 Age-related nuclear cataract, right eye: Secondary | ICD-10-CM | POA: Diagnosis not present

## 2024-06-03 DIAGNOSIS — H5213 Myopia, bilateral: Secondary | ICD-10-CM | POA: Diagnosis not present

## 2024-06-03 DIAGNOSIS — H43812 Vitreous degeneration, left eye: Secondary | ICD-10-CM | POA: Diagnosis not present

## 2024-06-03 DIAGNOSIS — H52223 Regular astigmatism, bilateral: Secondary | ICD-10-CM | POA: Diagnosis not present

## 2024-06-03 DIAGNOSIS — H35033 Hypertensive retinopathy, bilateral: Secondary | ICD-10-CM | POA: Diagnosis not present

## 2024-06-03 NOTE — Telephone Encounter (Signed)
 She was on Amlodipine  5 mg once daily in the past. Ok to Amlodipine  10 mg once daily since she is already taking that dose. That is max dose. She should not take more than 10 mg. Send BP readings in 2 weeks. Arrange follow up office visit. Glendia Ferrier, PA-C    06/03/2024 4:15 PM

## 2024-06-03 NOTE — Telephone Encounter (Signed)
 Pt c/o BP issue:  1. What are your last 5 BP readings? This morning it was 226/111 pulse 117 after morning 5:15 AM 206/104 pulse was 119, yesterday it was 196/81 pulse 87, 168/94 pulse 93, 184/81 pulse 91   2. Are you having any other symptoms (ex. Dizziness, headache, blurred vision, passed out)?  Having severe headaches, lost a lot of weight, nauseated and vomiting, also dizziness 3. What is your medication issue? High blood pressure

## 2024-06-03 NOTE — Telephone Encounter (Signed)
 Went to hospital back in June for the worse HA she has have ever had and her BP has been high ever since.  She did see her PCP several weeks ago, she started her on several medications for the severe HAs.  She restarted Amlodipine  herself (says it was started earlier last year and then stopped) about a month ago, around the time HAs started, and is taking 10 mg daily.  She occasionally takes 15 mg. Says it is making me feel better.  Advised she should not restart a medication without physician input/approval/order.  Educated to medication interactions. Aware forwarding to Glendia Ferrier for advisement.  Aware office will call with his advisement.

## 2024-06-04 MED ORDER — AMLODIPINE BESYLATE 5 MG PO TABS: 5.0000 mg | ORAL_TABLET | Freq: Two times a day (BID) | ORAL | 3 refills | Status: DC

## 2024-06-04 NOTE — Telephone Encounter (Signed)
 Pt has been made aware that she can take Amlodipine  10 mg and that she shouldn't take more then that.  Per pt, she has been taking Amlodipine  5 bid.  She is aware to monitor her bp for 2 weeks and she will bring with her to her appt 06/19/24.

## 2024-06-10 ENCOUNTER — Other Ambulatory Visit: Payer: Self-pay | Admitting: Family Medicine

## 2024-06-10 DIAGNOSIS — G43709 Chronic migraine without aura, not intractable, without status migrainosus: Secondary | ICD-10-CM

## 2024-06-10 DIAGNOSIS — M542 Cervicalgia: Secondary | ICD-10-CM | POA: Diagnosis not present

## 2024-06-12 DIAGNOSIS — K432 Incisional hernia without obstruction or gangrene: Secondary | ICD-10-CM | POA: Diagnosis not present

## 2024-06-12 DIAGNOSIS — M47892 Other spondylosis, cervical region: Secondary | ICD-10-CM | POA: Diagnosis not present

## 2024-06-12 NOTE — Progress Notes (Signed)
 [facility] Quality Team Note  Name: SOLASH TULLO Date of Birth: 1956/06/19 MRN: [id number] Date: 06/12/2024  [facility] Quality Team has reviewed this patient's chart, please see recommendations below:  [facility] Quality Other; (CHART REVIEWED FOR [employer], IT IS SCHEDULED FOR 06/26/2024)

## 2024-06-13 ENCOUNTER — Other Ambulatory Visit: Payer: Self-pay | Admitting: Surgery

## 2024-06-13 DIAGNOSIS — K432 Incisional hernia without obstruction or gangrene: Secondary | ICD-10-CM

## 2024-06-16 ENCOUNTER — Other Ambulatory Visit: Payer: Self-pay | Admitting: Family Medicine

## 2024-06-16 DIAGNOSIS — G43709 Chronic migraine without aura, not intractable, without status migrainosus: Secondary | ICD-10-CM

## 2024-06-18 ENCOUNTER — Ambulatory Visit
Admission: RE | Admit: 2024-06-18 | Discharge: 2024-06-18 | Disposition: A | Discharge status: Home / Self Care | Source: Ambulatory Visit | Attending: Surgery | Admitting: Surgery

## 2024-06-18 ENCOUNTER — Ambulatory Visit: Admitting: Physician Assistant

## 2024-06-18 DIAGNOSIS — M6208 Separation of muscle (nontraumatic), other site: Secondary | ICD-10-CM | POA: Diagnosis not present

## 2024-06-18 DIAGNOSIS — K439 Ventral hernia without obstruction or gangrene: Secondary | ICD-10-CM | POA: Diagnosis not present

## 2024-06-18 DIAGNOSIS — I701 Atherosclerosis of renal artery: Secondary | ICD-10-CM | POA: Diagnosis not present

## 2024-06-18 DIAGNOSIS — N261 Atrophy of kidney (terminal): Secondary | ICD-10-CM | POA: Diagnosis not present

## 2024-06-18 DIAGNOSIS — K432 Incisional hernia without obstruction or gangrene: Secondary | ICD-10-CM

## 2024-06-18 MED ORDER — IOPAMIDOL (ISOVUE-370) INJECTION 76%
75.0000 mL | Freq: Once | INTRAVENOUS | Status: AC | PRN
  Administered 2024-06-18: 75 mL via INTRAVENOUS

## 2024-06-18 NOTE — Progress Notes (Signed)
 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 Serene) S/p aortobifem and bilateral femoral to popliteal bypasses 07/2017 Chronic mesenteric ischemia S/p balloon angioplasty to SMA and celiac artery in 02/2020 (Dr. Serene) Hypokalemia Anal CA s/p XRT 2014 Diabetes mellitus Hyperlipidemia        Discussed the use of AI scribe software for clinical note transcription with the patient, who gave verbal consent to proceed. History of Present Illness Erin [name] is a 68 y.o. female who returns for follow up of HTN, CAD. She was last seen in 09/2023. She went to the ED in 04/2024 for a HA. Her BP remained high after this. Her amlodipine  was increased to 10 mg and follow up was arranged.   She experiences headaches when her blood pressure rises, particularly when it reaches the 200s.  She stopped taking aspirin  in February during hospitalization for AKI in the setting of dehydration. She has not experienced any chest pain, shortness of breath, or passing out, but she does report occasional swelling in her legs.   ROS-See HPI    Studies Reviewed:  EKG Interpretation Date/Time:  Wednesday June 19 2024 09:40:29 EDT Ventricular Rate:  82 PR Interval:  156 QRS Duration:  76 QT  Interval:  386 QTC Calculation: 450 R Axis:   46  Text Interpretation: Normal sinus rhythm Minimal voltage criteria for LVH, may be normal variant Septal infarct , age undetermined No significant change since last tracing Confirmed by Lelon Glendia [phone]) on 06/19/2024 9:41:53 AM    HYPERTENSION CONTROL Vitals:   06/19/24 0828 06/19/24 0938  BP: (!) 176/80 (!) 180/80    The patient's blood pressure is elevated above target today.  In order to address the patient's elevated BP: A new medication was prescribed today.; Blood pressure will be monitored at home to determine if medication changes need to be made.         Physical Exam:  VS:  BP (!) 180/80   Pulse 88   Ht 5' 3 (1.6 m)   Wt 96 lb 3.2 oz (43.6 kg)   SpO2 96%   BMI 17.04 kg/m        Wt Readings from Last 3 Encounters:  06/19/24 96 lb 3.2 oz (43.6 kg)  05/17/24 98 lb 4.8 oz (44.6 kg)  04/26/24 100 lb (45.4 kg)    Constitutional:      Appearance: Healthy appearance. Not in distress.  Neck:     Vascular: JVD normal.  Pulmonary:     Breath sounds: Normal breath sounds. No wheezing. No rales.  Cardiovascular:     Normal rate. Regular rhythm.     Murmurs: There is no murmur.  Edema:    Peripheral edema absent.  Abdominal:     General: There is abdominal  bruit.     Palpations: Abdomen is soft.       Assessment and Plan:    Assessment & Plan Essential hypertension Blood pressure is uncontrolled.  It has been creeping up over the last several visits.  After last visit, we placed her on amlodipine .  She is currently taking it twice a day.  She brings in a list of her blood pressures today.  They remain significantly elevated at home.  She has headaches associated with this.  She notes that she was recently placed on medication for migraines.  Question if the migraines are contributing to her blood pressure or if her headaches are related to elevated blood pressure.  She does have a significant history of vascular  disease.  I think would be important to rule out renal artery stenosis. -Change amlodipine  to 10 mg a day -Stop metoprolol  succinate -Start carvedilol  6.25 mg twice daily -BMET today -Arrange renal artery ultrasound to rule out renal artery stenosis -Send blood pressure readings in a week -If creatinine normal and blood pressure remains elevated, start valsartan -Follow-up 1 month Coronary artery disease involving native coronary artery of native heart without angina pectoris S/p DES to the RCA in 2018.  She is doing well without anginal symptoms.  She was previously on rivaroxaban  given her extensive vascular disease.  This was transition back to dual antiplatelet therapy.  Aspirin  was held when she was admitted in February with AKI in the setting of dehydration.  She can resume aspirin  81 mg daily.  Continue Plavix  75 mg daily, rosuvastatin  40 mg daily, nitroglycerin  as needed. Peripheral vascular disease (HCC) As noted, we will arrange renal artery ultrasound to rule out renal artery stenosis.  Of note, she did have a CT scan with general surgery recently due to ventral hernia.  I will follow-up on this to see if it images her renal arteries well.  If so, I will cancel the ultrasound.        Dispo:  Return in about 4 weeks (around 07/17/2024) for Routine Follow Up, w/ Glendia Ferrier, PA-C.  Signed, Glendia Ferrier, PA-C   "

## 2024-06-19 ENCOUNTER — Telehealth: Payer: Self-pay

## 2024-06-19 ENCOUNTER — Encounter: Payer: Self-pay | Admitting: Physician Assistant

## 2024-06-19 ENCOUNTER — Ambulatory Visit: Attending: Cardiology | Admitting: Physician Assistant

## 2024-06-19 VITALS — BP 180/80 | HR 88 | Ht 63.0 in | Wt 96.2 lb

## 2024-06-19 DIAGNOSIS — I2510 Atherosclerotic heart disease of native coronary artery without angina pectoris: Secondary | ICD-10-CM | POA: Diagnosis not present

## 2024-06-19 DIAGNOSIS — I739 Peripheral vascular disease, unspecified: Secondary | ICD-10-CM

## 2024-06-19 DIAGNOSIS — I10 Essential (primary) hypertension: Secondary | ICD-10-CM

## 2024-06-19 LAB — BASIC METABOLIC PANEL WITH GFR
BUN/Creatinine Ratio: 14 (ref 12–28)
BUN: 19 mg/dL (ref 8–27)
CO2: 16 mmol/L — ABNORMAL LOW (ref 20–29)
Calcium: 9.1 mg/dL (ref 8.7–10.3)
Chloride: 102 mmol/L (ref 96–106)
Creatinine, Ser: 1.38 mg/dL — ABNORMAL HIGH (ref 0.57–1.00)
Glucose: 95 mg/dL (ref 70–99)
Potassium: 3.3 mmol/L — ABNORMAL LOW (ref 3.5–5.2)
Sodium: 137 mmol/L (ref 134–144)
eGFR: 42 mL/min/1.73 — ABNORMAL LOW (ref >59)

## 2024-06-19 MED ORDER — CARVEDILOL 6.25 MG PO TABS: 6.2500 mg | ORAL_TABLET | Freq: Two times a day (BID) | ORAL | 3 refills | Status: DC

## 2024-06-19 MED ORDER — ASPIRIN 81 MG PO TBEC: 81.0000 mg | DELAYED_RELEASE_TABLET | Freq: Every day | ORAL | Status: DC

## 2024-06-19 MED ORDER — AMLODIPINE BESYLATE 10 MG PO TABS: 10.0000 mg | ORAL_TABLET | Freq: Every day | ORAL | 3 refills | Status: DC

## 2024-06-19 NOTE — Assessment & Plan Note (Signed)
 S/p DES to the RCA in 2018.  She is doing well without anginal symptoms.  She was previously on rivaroxaban  given her extensive vascular disease.  This was transition back to dual antiplatelet therapy.  Aspirin  was held when she was admitted in February with AKI in the setting of dehydration.  She can resume aspirin  81 mg daily.  Continue Plavix  75 mg daily, rosuvastatin  40 mg daily, nitroglycerin  as needed.

## 2024-06-19 NOTE — Telephone Encounter (Signed)
-----   Message from [facility] sent at 06/19/2024  4:15 PM EDT ----- Regarding: RE: Timing of recall Will be me. I need to work on a polyp site. ----- Message ----- From: Anitra Odetta CROME, RN Sent: 06/19/2024   4:09 PM EDT To: Aloha Wilhelmenia Raddle., MD Subject: RE: Timing of recall                           Should it be with you or Dr Suzann? ----- Message ----- From: Wilhelmenia Aloha Raddle., MD Sent: 06/19/2024   3:06 PM EDT To: Odetta CROME Anitra, RN Subject: RE: Timing of recall                           Lorana Maffeo, Please move this recall up into September or October so that we can get it done before the end of the year. Thanks. GM ----- Message ----- From: Anitra Odetta CROME, RN Sent: 06/19/2024   2:53 PM EDT To: Aloha Wilhelmenia Raddle., MD Subject: RE: Timing of recall                           04/2025 with Dr Mcgreal and there is one in for you in November 25 ----- Message ----- From: Wilhelmenia Aloha Raddle., MD Sent: 06/19/2024   2:32 PM EDT To: Odetta CROME Anitra, RN Subject: Timing of recall                               Jeffory Snelgrove, When do have this patient's recall colonoscopy set for? Thanks.

## 2024-06-19 NOTE — Assessment & Plan Note (Signed)
 Blood pressure is uncontrolled.  It has been creeping up over the last several visits.  After last visit, we placed her on amlodipine .  She is currently taking it twice a day.  She brings in a list of her blood pressures today.  They remain significantly elevated at home.  She has headaches associated with this.  She notes that she was recently placed on medication for migraines.  Question if the migraines are contributing to her blood pressure or if her headaches are related to elevated blood pressure.  She does have a significant history of vascular disease.  I think would be important to rule out renal artery stenosis. -Change amlodipine  to 10 mg a day -Stop metoprolol  succinate -Start carvedilol  6.25 mg twice daily -BMET today -Arrange renal artery ultrasound to rule out renal artery stenosis -Send blood pressure readings in a week -If creatinine normal and blood pressure remains elevated, start valsartan -Follow-up 1 month

## 2024-06-19 NOTE — Assessment & Plan Note (Signed)
 As noted, we will arrange renal artery ultrasound to rule out renal artery stenosis.  Of note, she did have a CT scan with general surgery recently due to ventral hernia.  I will follow-up on this to see if it images her renal arteries well.  If so, I will cancel the ultrasound.

## 2024-06-19 NOTE — Patient Instructions (Addendum)
 Medication Instructions:  Your physician has recommended you make the following change in your medication:   STOP Metoprolol   START Carvedilol  6.25 taking 1 twice a day  CHANGE the Amlodipine  to 10 mg taking 1 daily  RESTART Aspirin  81 mg taking 1 daily  *If you need a refill on your cardiac medications before your next appointment, please call your pharmacy*  Lab Work: TODAY:  BMET  If you have labs (blood work) drawn today and your tests are completely normal, you will receive your results only by: MyChart Message (if you have MyChart) OR A paper copy in the mail If you have any lab test that is abnormal or we need to change your treatment, we will call you to review the results.  Testing/Procedures: Your physician has requested that you have a renal artery duplex. During this test, an ultrasound is used to evaluate blood flow to the kidneys. Allow one hour for this exam. Do not eat after midnight the day before and avoid carbonated beverages. Take your medications as you usually do.   Follow-Up: At [facility], you and your health needs are our priority.  As part of our continuing mission to provide you with exceptional heart care, our providers are all part of one team.  This team includes your primary Cardiologist (physician) and Advanced Practice Providers or APPs (Physician Assistants and Nurse Practitioners) who all work together to provide you with the care you need, when you need it.  Your next appointment:   1 month(s)  Provider:   Glendia Ferrier, PA-C          We recommend signing up for the patient portal called MyChart.  Sign up information is provided on this After Visit Summary.  MyChart is used to connect with patients for Virtual Visits (Telemedicine).  Patients are able to view lab/test results, encounter notes, upcoming appointments, etc.  Non-urgent messages can be sent to your provider as well.   To learn more about what you can do with MyChart, go to  [email or web address].   Other Instructions

## 2024-06-20 ENCOUNTER — Ambulatory Visit: Payer: Self-pay | Admitting: Physician Assistant

## 2024-06-20 DIAGNOSIS — I701 Atherosclerosis of renal artery: Secondary | ICD-10-CM

## 2024-06-20 DIAGNOSIS — I10 Essential (primary) hypertension: Secondary | ICD-10-CM

## 2024-06-20 DIAGNOSIS — E876 Hypokalemia: Secondary | ICD-10-CM

## 2024-06-21 ENCOUNTER — Other Ambulatory Visit: Payer: Self-pay

## 2024-06-21 ENCOUNTER — Encounter: Payer: Self-pay | Admitting: Gastroenterology

## 2024-06-21 DIAGNOSIS — Z860100 Personal history of colon polyps, unspecified: Secondary | ICD-10-CM

## 2024-06-21 MED ORDER — NA SULFATE-K SULFATE-MG SULF 17.5-3.13-1.6 GM/177ML PO SOLN: 1.0000 | Freq: Once | ORAL | 0 refills | Status: AC

## 2024-06-21 NOTE — Telephone Encounter (Signed)
 Colon has been set up for 08/29/24 at 315 pm at [facility] with GM

## 2024-06-21 NOTE — Telephone Encounter (Signed)
 Left message on machine to call back

## 2024-06-24 DIAGNOSIS — M47892 Other spondylosis, cervical region: Secondary | ICD-10-CM | POA: Diagnosis not present

## 2024-06-24 MED ORDER — POTASSIUM CHLORIDE CRYS ER 20 MEQ PO TBCR: 20.0000 meq | EXTENDED_RELEASE_TABLET | Freq: Two times a day (BID) | ORAL | 3 refills | Status: DC

## 2024-06-24 NOTE — Telephone Encounter (Signed)
 Patient is returning a call back to [place]. Patient is requesting a call back. Please advise.

## 2024-06-24 NOTE — Telephone Encounter (Signed)
 EUS scheduled, pt instructed and medications reviewed.  Patient instructions mailed to home.  Patient to call with any questions or concerns.

## 2024-06-26 ENCOUNTER — Ambulatory Visit

## 2024-06-26 DIAGNOSIS — Z13820 Encounter for screening for osteoporosis: Secondary | ICD-10-CM | POA: Diagnosis not present

## 2024-06-26 DIAGNOSIS — M810 Age-related osteoporosis without current pathological fracture: Secondary | ICD-10-CM | POA: Diagnosis not present

## 2024-06-26 DIAGNOSIS — Z780 Asymptomatic menopausal state: Secondary | ICD-10-CM | POA: Diagnosis not present

## 2024-06-26 DIAGNOSIS — Z1231 Encounter for screening mammogram for malignant neoplasm of breast: Secondary | ICD-10-CM | POA: Diagnosis not present

## 2024-06-28 ENCOUNTER — Ambulatory Visit: Payer: Self-pay | Admitting: Family Medicine

## 2024-06-28 ENCOUNTER — Other Ambulatory Visit: Payer: Self-pay | Admitting: Family Medicine

## 2024-06-28 DIAGNOSIS — E039 Hypothyroidism, unspecified: Secondary | ICD-10-CM

## 2024-07-01 DIAGNOSIS — E876 Hypokalemia: Secondary | ICD-10-CM | POA: Diagnosis not present

## 2024-07-01 DIAGNOSIS — I10 Essential (primary) hypertension: Secondary | ICD-10-CM | POA: Diagnosis not present

## 2024-07-01 DIAGNOSIS — H8111 Benign paroxysmal vertigo, right ear: Secondary | ICD-10-CM | POA: Diagnosis not present

## 2024-07-02 ENCOUNTER — Telehealth: Payer: Self-pay | Admitting: Surgery

## 2024-07-02 ENCOUNTER — Ambulatory Visit (HOSPITAL_COMMUNITY)
Admission: RE | Admit: 2024-07-02 | Discharge: 2024-07-02 | Disposition: A | Discharge status: Home / Self Care | Source: Ambulatory Visit | Attending: Physician Assistant | Admitting: Physician Assistant

## 2024-07-02 DIAGNOSIS — I701 Atherosclerosis of renal artery: Secondary | ICD-10-CM | POA: Insufficient documentation

## 2024-07-02 DIAGNOSIS — I739 Peripheral vascular disease, unspecified: Secondary | ICD-10-CM | POA: Diagnosis not present

## 2024-07-02 DIAGNOSIS — I2510 Atherosclerotic heart disease of native coronary artery without angina pectoris: Secondary | ICD-10-CM | POA: Insufficient documentation

## 2024-07-02 DIAGNOSIS — I10 Essential (primary) hypertension: Secondary | ICD-10-CM | POA: Insufficient documentation

## 2024-07-02 NOTE — Telephone Encounter (Signed)
 Abdominal CT reviewed and discussed with patient. Fairly broad incisional hernia within an approximately 7cm diastasis; no obstruction or intra-abdominal GI abnormality but notably does have progressive left renal artery stenosis and now a rising creatinine.  Cardiology team is working this up further.  Also noted patient scheduled to have repeat colonoscopy in October with Dr. Wilhelmenia.  Discussed with patient I think it is very unlikely this hernia will cause any bowel obstruction or acute issue and would recommend holding off on surgery while these other issues are being addressed.  I will have her meet with one of the robotic hernia specialists in our group in the fall after her repeat colonoscopy is done but warned her that I suspect that she will ultimately need an open repair.  Encouraged her to work on improving her nutritional status in the meantime and she will call us  if there is any change or concern regarding the hernia.

## 2024-07-03 DIAGNOSIS — M47892 Other spondylosis, cervical region: Secondary | ICD-10-CM | POA: Diagnosis not present

## 2024-07-04 ENCOUNTER — Other Ambulatory Visit: Payer: Self-pay

## 2024-07-04 DIAGNOSIS — K551 Chronic vascular disorders of intestine: Secondary | ICD-10-CM

## 2024-07-08 ENCOUNTER — Other Ambulatory Visit: Payer: Self-pay | Admitting: Family Medicine

## 2024-07-08 DIAGNOSIS — G43709 Chronic migraine without aura, not intractable, without status migrainosus: Secondary | ICD-10-CM

## 2024-07-08 LAB — BASIC METABOLIC PANEL WITH GFR
BUN/Creatinine Ratio: 13 (ref 12–28)
BUN: 20 mg/dL (ref 8–27)
CO2: 19 mmol/L — ABNORMAL LOW (ref 20–29)
Calcium: 8.6 mg/dL — ABNORMAL LOW (ref 8.7–10.3)
Chloride: 105 mmol/L (ref 96–106)
Creatinine, Ser: 1.49 mg/dL — ABNORMAL HIGH (ref 0.57–1.00)
Glucose: 69 mg/dL — ABNORMAL LOW (ref 70–99)
Potassium: 4 mmol/L (ref 3.5–5.2)
Sodium: 137 mmol/L (ref 134–144)
eGFR: 38 mL/min/1.73 — ABNORMAL LOW (ref >59)

## 2024-07-08 LAB — ALDOSTERONE + RENIN ACTIVITY W/ RATIO
Aldos/Renin Ratio: 3 (ref 0.0–30.0)
Aldosterone: 12.9 ng/dL (ref 0.0–30.0)
Renin Activity, Plasma: 4.329 ng/mL/h (ref 0.167–5.380)

## 2024-07-08 LAB — MAGNESIUM: Magnesium: 2 mg/dL (ref 1.6–2.3)

## 2024-07-08 MED ORDER — HYDRALAZINE HCL 25 MG PO TABS: 25.0000 mg | ORAL_TABLET | Freq: Three times a day (TID) | ORAL | 3 refills | Status: DC

## 2024-07-08 MED ORDER — CARVEDILOL 12.5 MG PO TABS: 12.5000 mg | ORAL_TABLET | Freq: Two times a day (BID) | ORAL | 3 refills | Status: DC

## 2024-07-09 DIAGNOSIS — M65311 Trigger thumb, right thumb: Secondary | ICD-10-CM | POA: Diagnosis not present

## 2024-07-09 MED ORDER — CARVEDILOL 6.25 MG PO TABS: 6.2500 mg | ORAL_TABLET | Freq: Two times a day (BID) | ORAL | Status: DC

## 2024-07-10 DIAGNOSIS — M47892 Other spondylosis, cervical region: Secondary | ICD-10-CM | POA: Diagnosis not present

## 2024-07-11 ENCOUNTER — Encounter: Payer: Self-pay | Admitting: Neurology

## 2024-07-11 ENCOUNTER — Ambulatory Visit: Admitting: Neurology

## 2024-07-11 VITALS — BP 134/83 | HR 70 | Ht 63.0 in | Wt 96.4 lb

## 2024-07-11 DIAGNOSIS — M542 Cervicalgia: Secondary | ICD-10-CM | POA: Diagnosis not present

## 2024-07-11 DIAGNOSIS — G43009 Migraine without aura, not intractable, without status migrainosus: Secondary | ICD-10-CM | POA: Diagnosis not present

## 2024-07-11 DIAGNOSIS — G4440 Drug-induced headache, not elsewhere classified, not intractable: Secondary | ICD-10-CM | POA: Diagnosis not present

## 2024-07-11 NOTE — Patient Instructions (Addendum)
  Contact your insurance to find out what cost of UBRELVY would be if approved. Continue TOPIRAMATE  50MG  TWICE DAILY Try to taper down TYLENOL  by one day every week until limited to taking TYLENOL   once no more than 2 days out of the week.  Shee #3 Limit use of pain relievers to no more than 9 days out of the month.  These medications include acetaminophen , NSAIDs (ibuprofen /Advil /Motrin , naproxen/Aleve, triptans (Imitrex /sumatriptan ), Excedrin, and narcotics.  This will help reduce risk of rebound headaches. Be aware of common food triggers:  - Caffeine:  coffee, black tea, cola, Mt. Dew  - Chocolate  - Dairy:  aged cheeses (brie, blue, cheddar, gouda, Parmasan, provolone, romano, Swiss, etc), chocolate milk, buttermilk, sour cream, limit eggs and yogurt  - Nuts, peanut butter  - Alcohol  - Cereals/grains:  FRESH breads (fresh bagels, sourdough, doughnuts), yeast productions  - Processed/canned/aged/cured meats (pre-packaged deli meats, hotdogs)  - MSG/glutamate:  soy sauce, flavor enhancer, pickled/preserved/marinated foods  - Sweeteners:  aspartame (Equal, Nutrasweet).  Sugar and Splenda are okay  - Vegetables:  legumes (lima beans, lentils, snow peas, fava beans, pinto peans, peas, garbanzo beans), sauerkraut, onions, olives, pickles  - Fruit:  avocados, bananas, citrus fruit (orange, lemon, grapefruit), mango  - Other:  Frozen meals, macaroni and cheese Routine exercise Stay adequately hydrated (aim for 64 oz water  daily) Keep headache diary Maintain proper stress management Maintain proper sleep hygiene Do not skip meals Consider supplements:  MigreLief 1 pill twice daliy, coenzyme Q10 100mg  three times daily.

## 2024-07-11 NOTE — Progress Notes (Signed)
 Medication Samples have been provided to the patient.  Drug name: Holland       Strength: 100 mg        Qty: 2   LOT: [id number]  Exp.Date: 12/26  Dosing instructions: as needed  The patient has been instructed regarding the correct time, dose, and frequency of taking this medication, including desired effects and most common side effects.   Erin [name] 10:55 AM 07/11/2024
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
## 2024-07-16 NOTE — Progress Notes (Unsigned)
 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 Serene) S/p aortobifem and bilateral femoral to popliteal bypasses 07/2017 Chronic mesenteric ischemia S/p balloon angioplasty to SMA and celiac artery in 02/2020 (Dr. 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 History of Present Illness Erin [name] is a 68 y.o. female who returns for follow up of HTN. She was last seen 06/19/24. Her BP was running high. Her Creatinine was also noted to be increasing. A follow up US  showed severe L RA stenosis. I referred her back to Dr. Serene with Vascular Surgery. He sees her next month. PAC and PRA were neg for hyperaldosteronism.   Recent BP readings at other medical visits were lower, including 132/32 at a neurologist visit and 120/64 at an orthopedic visit. She is currently taking amlodipine  10 mg once daily and carvedilol  6.25 mg twice daily for blood pressure management. She has not started hydralazine . Her  headaches have lessened, although they still occur. She was given samples of a new medication for migraines by her neurologist. No chest symptoms or shortness of breath.    ROS-See HPI     Studies Reviewed:       Recent Labs    01/17/24 0319 01/23/24 1428 06/19/24 1001 07/01/24 1505  BUN 16 17 19 20   CREATININE 1.12* 1.06 1.38* 1.49*  EGFR  --   --  42* 38*  K 4.8 4.4 3.3* 4.0    Aldosterone: 12.9 ng/dL Plasma Renin Activity: 4.329 ng/mL/hr Aldos/Renin Ratio: 3.0         Physical Exam:  VS:  BP 120/72   Pulse 64   Ht 5' 3 (1.6 m)   Wt 96 lb (43.5 kg)   SpO2 95%   BMI 17.01 kg/m        Wt Readings from Last 3 Encounters:  07/17/24 96 lb (43.5 kg)  07/11/24 96 lb 6.4 oz (43.7 kg)  06/19/24 96 lb 3.2 oz (43.6 kg)    Constitutional:      Appearance: Not in distress. Frail.  Pulmonary:     Breath sounds: Normal breath sounds. No wheezing. No rales.  Cardiovascular:     Normal rate. Regular rhythm.     Murmurs: There is no murmur.  Edema:    Peripheral edema absent.        Assessment and Plan:    Assessment & Plan Essential hypertension BP has been uncontrolled and her creatinine has been increasing. She has severe L RA stenosis on US  and CT and  will see vascular surgery next month for evaluation. Today, her BP is much better. She can continue on Amlodipine  10 mg once daily, Carvedilol  6.25 mg twice daily. She can take Hydralazine  25 mg once daily prn for elevated BP.  Coronary artery disease involving native coronary artery of native heart without angina pectoris S/p DES to the RCA in 2018.  She is doing well without anginal symptoms.  She is on a good medical program with ASA 81 mg once daily, Plavix  75 mg daily, rosuvastatin  40 mg daily, nitroglycerin  as needed.  -Follow up 6 mos  Renal artery stenosis (HCC) Severe L RA stenosis on recent CT and US . She has follow up with vascular surgery next month. AKI (acute kidney injury) (HCC) Creatinine has been  higher. Recent SCr was stable. Hopefully, this will improve with intervention of her L RA (if needed).         Dispo:  Return in about 6 months (around 01/17/2025) for Routine Follow Up, w/ Glendia Ferrier, PA-C.  Signed, Glendia Ferrier, PA-C

## 2024-07-16 NOTE — Assessment & Plan Note (Signed)
 Severe L RA stenosis on recent CT and US . She has follow up with vascular surgery next month.

## 2024-07-16 NOTE — Assessment & Plan Note (Signed)
 Uncontrolled in the setting of RA stenosis. ***

## 2024-07-17 ENCOUNTER — Ambulatory Visit: Attending: Cardiology | Admitting: Physician Assistant

## 2024-07-17 ENCOUNTER — Encounter: Payer: Self-pay | Admitting: Physician Assistant

## 2024-07-17 VITALS — BP 120/72 | HR 64 | Ht 63.0 in | Wt 96.0 lb

## 2024-07-17 DIAGNOSIS — I701 Atherosclerosis of renal artery: Secondary | ICD-10-CM | POA: Diagnosis not present

## 2024-07-17 DIAGNOSIS — I2510 Atherosclerotic heart disease of native coronary artery without angina pectoris: Secondary | ICD-10-CM

## 2024-07-17 DIAGNOSIS — I10 Essential (primary) hypertension: Secondary | ICD-10-CM | POA: Diagnosis not present

## 2024-07-17 DIAGNOSIS — N179 Acute kidney failure, unspecified: Secondary | ICD-10-CM

## 2024-07-17 MED ORDER — HYDRALAZINE HCL 25 MG PO TABS: ORAL_TABLET | ORAL | Status: DC

## 2024-07-17 NOTE — Assessment & Plan Note (Signed)
 Creatinine has been higher. Recent SCr was stable. Hopefully, this will improve with intervention of her L RA (if needed).

## 2024-07-17 NOTE — Patient Instructions (Signed)
 Medication Instructions:  No changes. If BP is > 160/95, it is ok to take 1 of the hydralazine  25 mg tablets. If it does not come down with that, call our office.  *If you need a refill on your cardiac medications before your next appointment, please call your pharmacy*  Follow-Up: At [facility], you and your health needs are our priority.  As part of our continuing mission to provide you with exceptional heart care, our providers are all part of one team.  This team includes your primary Cardiologist (physician) and Advanced Practice Providers or APPs (Physician Assistants and Nurse Practitioners) who all work together to provide you with the care you need, when you need it.  Your next appointment:   6 month(s)  Provider:   Glendia Ferrier, PA-C          We recommend signing up for the patient portal called MyChart.  Sign up information is provided on this After Visit Summary.  MyChart is used to connect with patients for Virtual Visits (Telemedicine).  Patients are able to view lab/test results, encounter notes, upcoming appointments, etc.  Non-urgent messages can be sent to your provider as well.   To learn more about what you can do with MyChart, go to [email or web address].   Other Instructions

## 2024-07-17 NOTE — Assessment & Plan Note (Signed)
 S/p DES to the RCA in 2018.  She is doing well without anginal symptoms.  She is on a good medical program with ASA 81 mg once daily, Plavix  75 mg daily, rosuvastatin  40 mg daily, nitroglycerin  as needed.  -Follow up 6 mos

## 2024-07-19 ENCOUNTER — Other Ambulatory Visit: Payer: Self-pay | Admitting: Family Medicine

## 2024-07-23 ENCOUNTER — Other Ambulatory Visit: Payer: Self-pay | Admitting: Family Medicine

## 2024-07-23 DIAGNOSIS — G43709 Chronic migraine without aura, not intractable, without status migrainosus: Secondary | ICD-10-CM

## 2024-07-24 DIAGNOSIS — M47892 Other spondylosis, cervical region: Secondary | ICD-10-CM | POA: Diagnosis not present

## 2024-08-06 DIAGNOSIS — M47892 Other spondylosis, cervical region: Secondary | ICD-10-CM | POA: Diagnosis not present

## 2024-08-07 ENCOUNTER — Other Ambulatory Visit: Payer: Self-pay | Admitting: Vascular Surgery

## 2024-08-09 ENCOUNTER — Telehealth: Payer: Self-pay

## 2024-08-09 NOTE — Telephone Encounter (Signed)
 Called pt to see if she had enough Plavix  to last until her appt on 08/12/24. Pt seemed confused about whether she even takes this medication. Did not refill.  If necessary, will refill after her appt on 08/12/24.

## 2024-08-11 ENCOUNTER — Other Ambulatory Visit: Payer: Self-pay | Admitting: Physician Assistant

## 2024-08-11 ENCOUNTER — Other Ambulatory Visit: Payer: Self-pay | Admitting: Family Medicine

## 2024-08-11 DIAGNOSIS — G43709 Chronic migraine without aura, not intractable, without status migrainosus: Secondary | ICD-10-CM

## 2024-08-12 ENCOUNTER — Ambulatory Visit: Attending: Surgery | Admitting: Surgery

## 2024-08-12 ENCOUNTER — Encounter: Payer: Self-pay | Admitting: Surgery

## 2024-08-12 ENCOUNTER — Ambulatory Visit (HOSPITAL_BASED_OUTPATIENT_CLINIC_OR_DEPARTMENT_OTHER)
Admission: RE | Admit: 2024-08-12 | Discharge: 2024-08-12 | Disposition: A | Discharge status: Home / Self Care | Source: Ambulatory Visit | Attending: Surgery | Admitting: Surgery

## 2024-08-12 ENCOUNTER — Other Ambulatory Visit (HOSPITAL_COMMUNITY): Payer: Self-pay

## 2024-08-12 ENCOUNTER — Other Ambulatory Visit (HOSPITAL_COMMUNITY): Payer: Self-pay | Admitting: Surgery

## 2024-08-12 ENCOUNTER — Ambulatory Visit (HOSPITAL_COMMUNITY)
Admission: RE | Admit: 2024-08-12 | Discharge: 2024-08-12 | Disposition: A | Discharge status: Home / Self Care | Source: Ambulatory Visit | Attending: Surgery | Admitting: Surgery

## 2024-08-12 VITALS — BP 134/77 | HR 70 | Temp 98.0°F | Ht 63.0 in | Wt 93.0 lb

## 2024-08-12 DIAGNOSIS — K551 Chronic vascular disorders of intestine: Secondary | ICD-10-CM

## 2024-08-12 DIAGNOSIS — I739 Peripheral vascular disease, unspecified: Secondary | ICD-10-CM

## 2024-08-12 DIAGNOSIS — I70213 Atherosclerosis of native arteries of extremities with intermittent claudication, bilateral legs: Secondary | ICD-10-CM

## 2024-08-12 LAB — VAS US ABI WITH/WO TBI
Left ABI: 1.05
Right ABI: 1.05

## 2024-08-12 MED ORDER — CLOPIDOGREL BISULFATE 75 MG PO TABS
75.0000 mg | ORAL_TABLET | Freq: Every day | ORAL | 11 refills | Status: DC
  Filled 2024-08-12: qty 30, 30d supply, fill #0

## 2024-08-12 NOTE — H&P (View-Only) (Signed)
 [facility] [place]  Patient name: Erin [name] MRN: [id number] DOB: September 15, 1956 Sex: female   REASON FOR VISIT:    Follow-up  HISOTRY OF PRESENT ILLNESS:    Erin [name] is a 68 y.o. female who has undergone the following procedures: 08/14/2017: Aortobifemoral bypass graft with reimplantation of the inferior mesenteric artery (Dr. Oris for claudication) 08/14/2017: Thrombectomy of left profundofemoral artery and left femoral to above-knee popliteal bypass with vein (Dr. Oris) 08/15/2017: Right femoral to above-knee popliteal bypass graft with vein 05/22/2018: Stent, superior mesenteric and celiac artery (symptomatic mesenteric stenosis) 03/03/2020: Drug-coated balloon angioplasty, supra mesenteric artery and celiac artery for in-stent stenosis   She has been diagnosed with anal cancer and had radiation and chemotherapy.  She has a history of coronary artery disease status post PCI.  She takes a statin for hypercholesterolemia.  She is a smoker.  She denies any significant abdominal pain.  She has lost weight and now weighs 95 pounds.  She does not endorse claudication symptoms.  Her walking is limited by hip pain. PAST MEDICAL HISTORY:   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/14 60.4Gy/16fx and chemo   Anxiety    Aortoiliac occlusive disease (HCC) 08/14/2017   Arthritis    B12 deficiency anemia 09/14/2015   Blood transfusion without reported diagnosis    BPPV (benign paroxysmal positional vertigo) 07/08/2015   Cardiomyopathy (HCC) 11/03/2017   Chronic back pain    Chronic daily headache 03/29/2013   takes bc powder   Closed right hip fracture (HCC) 09/10/2015   Coronary artery disease involving native coronary artery of native heart without angina pectoris 10/13/2017   DES to mid RCA   Depression     Essential (hemorrhagic) thrombocythemia (HCC) 09/06/2018   Essential hypertension 09/25/2023   Family history of early CAD 04/08/2016   GERD (gastroesophageal reflux disease)    History of hiatal hernia    Hot flashes    Hyperlipidemia    Hyperlipidemia associated with type 2 diabetes mellitus (HCC) 05/11/2017   Hypothyroidism 09/10/2015   IBS (irritable bowel syndrome) 03/29/2013   Neck pain 07/08/2015   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   takes neurotin   Peripheral vascular disease (HCC) 11/03/2017   QT prolongation    Sacroiliitis (HCC) 09/06/2018   Spasms of the hands or feet 10/08/2017   Syncope 06/07/2016   Tubular adenoma of colon 09/08/2003   Vertigo    Wears glasses      FAMILY HISTORY:   Family History  Problem Relation Age of Onset   Dementia Mother    Arthritis Mother    Hyperlipidemia Mother    Heart disease Mother    Hypertension Mother    Stroke Mother 90   Irritable bowel syndrome Mother    Thyroid  disease Mother    Heart attack Mother    Heart disease Father    Hyperlipidemia Father    Hypertension Father    Stroke Father 40   Thyroid  disease Father    Prostate cancer Father    Heart attack Father    Lung cancer Brother 43   Heart attack Maternal Uncle    Heart attack Maternal Grandfather    Colon cancer Neg Hx    Rectal cancer Neg Hx    Stomach cancer Neg Hx    Esophageal cancer Neg Hx     SOCIAL HISTORY:  Social History   Tobacco Use   Smoking status: Every Day    Current packs/day: 0.00    Types: Cigarettes    Last attempt to quit: 10/07/2014    Years since quitting: 9.8   Smokeless tobacco: Never  Substance Use Topics   Alcohol use: No    Alcohol/week: 0.0 standard drinks of alcohol     ALLERGIES:   Allergies  Allergen Reactions   Amitiza  [Lubiprostone ] Diarrhea    Uncontrollable diarrhea   Nortriptyline  Other (See Comments)    Stomach distention   Sulfa Antibiotics Nausea And Vomiting and Other (See Comments)    GI  distress/pain   Tramadol  Itching and Rash   Atorvastatin  Nausea And Vomiting   Claritin [Loratadine] Other (See Comments)    Hot flashes     CURRENT MEDICATIONS:   Current Outpatient Medications  Medication Sig Dispense Refill   acetaminophen  (TYLENOL ) 500 MG tablet Take 1,000 mg by mouth daily as needed for headache (pain).      acyclovir  ointment (ZOVIRAX ) 5 % Apply 1 Application topically every 3 (three) hours as needed (fever blisters). 15 g 3   amLODipine  (NORVASC ) 10 MG tablet Take 1 tablet (10 mg total) by mouth daily. 90 tablet 3   aspirin  EC 81 MG tablet Take 1 tablet (81 mg total) by mouth daily. Swallow whole.     carvedilol  (COREG ) 6.25 MG tablet Take 1 tablet (6.25 mg total) by mouth 2 (two) times daily.     Cholecalciferol (VITAMIN D ) 2000 units tablet Take 2,000 Units by mouth daily.     clopidogrel  (PLAVIX ) 75 MG tablet TAKE 1 TABLET BY MOUTH EVERY DAY 90 tablet 1   DULoxetine  (CYMBALTA ) 20 MG capsule TAKE 2-3 CAPSULES BY MOUTH EVERY DAY 270 capsule 1   fluticasone  (FLONASE ) 50 MCG/ACT nasal spray SPRAY 1 SPRAY INTO EACH NOSTRIL DAILY 96 mL 1   gabapentin  (NEURONTIN ) 600 MG tablet TAKE 1 TABLET BY MOUTH THREE TIMES A DAY 90 tablet 3   ketoconazole  (NIZORAL ) 2 % shampoo Apply to hair daily for 7 days -- leave on for 5 minutes, then wash every other day for 7 days, then wash twice a week for 7 days,  then once a week. 120 mL 5   levothyroxine  (SYNTHROID ) 75 MCG tablet TAKE 1 TABLET BY MOUTH DAILY BEFORE BREAKFAST MONDAY-FRIDAY, THEN 1/2 SATURDAY AND SUNDAY 84 tablet 0   meclizine  (ANTIVERT ) 25 MG tablet TAKE 1 TABLET BY MOUTH THREE TIMES A DAY AS NEEDED FOR DIZZINESS 90 tablet 1   metoCLOPramide  (REGLAN ) 5 MG tablet TAKE 1 TABLET BY MOUTH EVERY 6 HOURS AS NEEDED FOR NAUSEA. 60 tablet 0   nitroGLYCERIN  (NITROSTAT ) 0.4 MG SL tablet Place 1 tablet (0.4 mg total) under the tongue every 5 (five) minutes as needed for chest pain. 25 tablet 3   nystatin -triamcinolone  (MYCOLOG II)  cream Apply 1 application topically 2 (two) times daily as needed (dry skin).   2   ondansetron  (ZOFRAN ) 8 MG tablet Take 1 tablet (8 mg total) by mouth every 8 (eight) hours as needed for nausea or vomiting. 20 tablet 1   pantoprazole  (PROTONIX ) 40 MG tablet TAKE 1 TABLET BY MOUTH TWICE A DAY 180 tablet 3   potassium chloride  SA (KLOR-CON  M20) 20 MEQ tablet Take 1 tablet (20 mEq total) by mouth 2 (two) times daily. 180 tablet 3   rOPINIRole  (REQUIP ) 1 MG tablet TAKE 1 TABLET (1 MG TOTAL) BY MOUTH AT BEDTIME. MAY INCREASE TO 2 TABLET AT BEDTIME AFTER  7 DAYS IF NEEDED. 180 tablet 1   rosuvastatin  (CRESTOR ) 40 MG tablet Take 1 tablet (40 mg total) by mouth daily. 90 tablet 1   tiZANidine (ZANAFLEX) 4 MG tablet Take 2 mg by mouth as needed for muscle spasms.     topiramate  (TOPAMAX ) 50 MG tablet Take 0.5 tablets (25 mg total) by mouth at bedtime for 4 days, THEN 1 tablet (50 mg total) at bedtime for 4 days, THEN 1 tablet (50 mg total) 2 (two) times daily. 60 tablet 5   vitamin B-12 1000 MCG tablet Take 1 tablet (1,000 mcg total) by mouth daily. 30 tablet 0   hydrALAZINE  (APRESOLINE ) 25 MG tablet Take 1 tab once daily as needed for elevated BP (>160/95)     No current facility-administered medications for this visit.    REVIEW OF SYSTEMS:   [X]  denotes positive finding, [ ]  denotes negative finding Cardiac  Comments:  Chest pain or chest pressure:    Shortness of breath upon exertion:    Short of breath when lying flat:    Irregular heart rhythm:        Vascular    Pain in calf, thigh, or hip brought on by ambulation:    Pain in feet at night that wakes you up from your sleep:     Blood clot in your veins:    Leg swelling:         Pulmonary    Oxygen at home:    Productive cough:     Wheezing:         Neurologic    Sudden weakness in arms or legs:     Sudden numbness in arms or legs:     Sudden onset of difficulty speaking or slurred speech:    Temporary loss of vision in one eye:      Problems with dizziness:         Gastrointestinal    Blood in stool:     Vomited blood:         Genitourinary    Burning when urinating:     Blood in urine:        Psychiatric    Major depression:         Hematologic    Bleeding problems:    Problems with blood clotting too easily:        Skin    Rashes or ulcers:        Constitutional    Fever or chills:      PHYSICAL EXAM:   Vitals:   08/12/24 0852  BP: 134/77  Pulse: 70  Temp: 98 F (36.7 C)  SpO2: 96%  Weight: 93 lb (42.2 kg)  Height: 5' 3 (1.6 m)    GENERAL: The patient is a well-nourished female, in no acute distress. The vital signs are documented above. CARDIAC: There is a regular rate and rhythm.  VASCULAR: Palpable right posterior tibial pulse.  Multiphasic posterior tibial Doppler signals PULMONARY: Non-labored respirations ABDOMEN: Soft and non-tender with normal pitched bowel sounds.  MUSCULOSKELETAL: There are no major deformities or cyanosis. NEUROLOGIC: No focal weakness or paresthesias are detected. SKIN: There are no ulcers or rashes noted. PSYCHIATRIC: The patient has a normal affect.  STUDIES:   I have reviewed the following ultrasound:  Mesenteric:  70 to 99% stenosis in the superior mesenteric artery and celiac artery.  SMA  velocities just meeting threshold of >70% stenosis category.  Stenting appears patent with mild thickening of the stent walls observed.  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  She has developed progressive stenosis in both arteries.  I have recommended angiography to define the stenosis and to treat her in-stent stenosis if indicated.  PAD: The patient has a palpable right posterior tibial pulse and nonpalpable left but multiphasic Doppler signals.  I am getting ABIs today.  I will also plan on  imaging both of her femoral-popliteal bypass grafts at the time of her mesenteric angiogram.  Plavix  was refilled today.    Malvina Serene CLORE, MD, FACS [facility] [phone] Pager [phone]

## 2024-08-12 NOTE — Progress Notes (Signed)
 [facility] [place]  Patient name: Erin [name] MRN: [id number] DOB: September 15, 1956 Sex: female   REASON FOR VISIT:    Follow-up  HISOTRY OF PRESENT ILLNESS:    Erin [name] is a 68 y.o. female who has undergone the following procedures: 08/14/2017: Aortobifemoral bypass graft with reimplantation of the inferior mesenteric artery (Dr. Oris for claudication) 08/14/2017: Thrombectomy of left profundofemoral artery and left femoral to above-knee popliteal bypass with vein (Dr. Oris) 08/15/2017: Right femoral to above-knee popliteal bypass graft with vein 05/22/2018: Stent, superior mesenteric and celiac artery (symptomatic mesenteric stenosis) 03/03/2020: Drug-coated balloon angioplasty, supra mesenteric artery and celiac artery for in-stent stenosis   She has been diagnosed with anal cancer and had radiation and chemotherapy.  She has a history of coronary artery disease status post PCI.  She takes a statin for hypercholesterolemia.  She is a smoker.  She denies any significant abdominal pain.  She has lost weight and now weighs 95 pounds.  She does not endorse claudication symptoms.  Her walking is limited by hip pain. PAST MEDICAL HISTORY:   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/14 60.4Gy/16fx and chemo   Anxiety    Aortoiliac occlusive disease (HCC) 08/14/2017   Arthritis    B12 deficiency anemia 09/14/2015   Blood transfusion without reported diagnosis    BPPV (benign paroxysmal positional vertigo) 07/08/2015   Cardiomyopathy (HCC) 11/03/2017   Chronic back pain    Chronic daily headache 03/29/2013   takes bc powder   Closed right hip fracture (HCC) 09/10/2015   Coronary artery disease involving native coronary artery of native heart without angina pectoris 10/13/2017   DES to mid RCA   Depression     Essential (hemorrhagic) thrombocythemia (HCC) 09/06/2018   Essential hypertension 09/25/2023   Family history of early CAD 04/08/2016   GERD (gastroesophageal reflux disease)    History of hiatal hernia    Hot flashes    Hyperlipidemia    Hyperlipidemia associated with type 2 diabetes mellitus (HCC) 05/11/2017   Hypothyroidism 09/10/2015   IBS (irritable bowel syndrome) 03/29/2013   Neck pain 07/08/2015   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   takes neurotin   Peripheral vascular disease (HCC) 11/03/2017   QT prolongation    Sacroiliitis (HCC) 09/06/2018   Spasms of the hands or feet 10/08/2017   Syncope 06/07/2016   Tubular adenoma of colon 09/08/2003   Vertigo    Wears glasses      FAMILY HISTORY:   Family History  Problem Relation Age of Onset   Dementia Mother    Arthritis Mother    Hyperlipidemia Mother    Heart disease Mother    Hypertension Mother    Stroke Mother 90   Irritable bowel syndrome Mother    Thyroid  disease Mother    Heart attack Mother    Heart disease Father    Hyperlipidemia Father    Hypertension Father    Stroke Father 40   Thyroid  disease Father    Prostate cancer Father    Heart attack Father    Lung cancer Brother 43   Heart attack Maternal Uncle    Heart attack Maternal Grandfather    Colon cancer Neg Hx    Rectal cancer Neg Hx    Stomach cancer Neg Hx    Esophageal cancer Neg Hx     SOCIAL HISTORY:  Social History   Tobacco Use   Smoking status: Every Day    Current packs/day: 0.00    Types: Cigarettes    Last attempt to quit: 10/07/2014    Years since quitting: 9.8   Smokeless tobacco: Never  Substance Use Topics   Alcohol use: No    Alcohol/week: 0.0 standard drinks of alcohol     ALLERGIES:   Allergies  Allergen Reactions   Amitiza  [Lubiprostone ] Diarrhea    Uncontrollable diarrhea   Nortriptyline  Other (See Comments)    Stomach distention   Sulfa Antibiotics Nausea And Vomiting and Other (See Comments)    GI  distress/pain   Tramadol  Itching and Rash   Atorvastatin  Nausea And Vomiting   Claritin [Loratadine] Other (See Comments)    Hot flashes     CURRENT MEDICATIONS:   Current Outpatient Medications  Medication Sig Dispense Refill   acetaminophen  (TYLENOL ) 500 MG tablet Take 1,000 mg by mouth daily as needed for headache (pain).      acyclovir  ointment (ZOVIRAX ) 5 % Apply 1 Application topically every 3 (three) hours as needed (fever blisters). 15 g 3   amLODipine  (NORVASC ) 10 MG tablet Take 1 tablet (10 mg total) by mouth daily. 90 tablet 3   aspirin  EC 81 MG tablet Take 1 tablet (81 mg total) by mouth daily. Swallow whole.     carvedilol  (COREG ) 6.25 MG tablet Take 1 tablet (6.25 mg total) by mouth 2 (two) times daily.     Cholecalciferol (VITAMIN D ) 2000 units tablet Take 2,000 Units by mouth daily.     clopidogrel  (PLAVIX ) 75 MG tablet TAKE 1 TABLET BY MOUTH EVERY DAY 90 tablet 1   DULoxetine  (CYMBALTA ) 20 MG capsule TAKE 2-3 CAPSULES BY MOUTH EVERY DAY 270 capsule 1   fluticasone  (FLONASE ) 50 MCG/ACT nasal spray SPRAY 1 SPRAY INTO EACH NOSTRIL DAILY 96 mL 1   gabapentin  (NEURONTIN ) 600 MG tablet TAKE 1 TABLET BY MOUTH THREE TIMES A DAY 90 tablet 3   ketoconazole  (NIZORAL ) 2 % shampoo Apply to hair daily for 7 days -- leave on for 5 minutes, then wash every other day for 7 days, then wash twice a week for 7 days,  then once a week. 120 mL 5   levothyroxine  (SYNTHROID ) 75 MCG tablet TAKE 1 TABLET BY MOUTH DAILY BEFORE BREAKFAST MONDAY-FRIDAY, THEN 1/2 SATURDAY AND SUNDAY 84 tablet 0   meclizine  (ANTIVERT ) 25 MG tablet TAKE 1 TABLET BY MOUTH THREE TIMES A DAY AS NEEDED FOR DIZZINESS 90 tablet 1   metoCLOPramide  (REGLAN ) 5 MG tablet TAKE 1 TABLET BY MOUTH EVERY 6 HOURS AS NEEDED FOR NAUSEA. 60 tablet 0   nitroGLYCERIN  (NITROSTAT ) 0.4 MG SL tablet Place 1 tablet (0.4 mg total) under the tongue every 5 (five) minutes as needed for chest pain. 25 tablet 3   nystatin -triamcinolone  (MYCOLOG II)  cream Apply 1 application topically 2 (two) times daily as needed (dry skin).   2   ondansetron  (ZOFRAN ) 8 MG tablet Take 1 tablet (8 mg total) by mouth every 8 (eight) hours as needed for nausea or vomiting. 20 tablet 1   pantoprazole  (PROTONIX ) 40 MG tablet TAKE 1 TABLET BY MOUTH TWICE A DAY 180 tablet 3   potassium chloride  SA (KLOR-CON  M20) 20 MEQ tablet Take 1 tablet (20 mEq total) by mouth 2 (two) times daily. 180 tablet 3   rOPINIRole  (REQUIP ) 1 MG tablet TAKE 1 TABLET (1 MG TOTAL) BY MOUTH AT BEDTIME. MAY INCREASE TO 2 TABLET AT BEDTIME AFTER  7 DAYS IF NEEDED. 180 tablet 1   rosuvastatin  (CRESTOR ) 40 MG tablet Take 1 tablet (40 mg total) by mouth daily. 90 tablet 1   tiZANidine (ZANAFLEX) 4 MG tablet Take 2 mg by mouth as needed for muscle spasms.     topiramate  (TOPAMAX ) 50 MG tablet Take 0.5 tablets (25 mg total) by mouth at bedtime for 4 days, THEN 1 tablet (50 mg total) at bedtime for 4 days, THEN 1 tablet (50 mg total) 2 (two) times daily. 60 tablet 5   vitamin B-12 1000 MCG tablet Take 1 tablet (1,000 mcg total) by mouth daily. 30 tablet 0   hydrALAZINE  (APRESOLINE ) 25 MG tablet Take 1 tab once daily as needed for elevated BP (>160/95)     No current facility-administered medications for this visit.    REVIEW OF SYSTEMS:   [X]  denotes positive finding, [ ]  denotes negative finding Cardiac  Comments:  Chest pain or chest pressure:    Shortness of breath upon exertion:    Short of breath when lying flat:    Irregular heart rhythm:        Vascular    Pain in calf, thigh, or hip brought on by ambulation:    Pain in feet at night that wakes you up from your sleep:     Blood clot in your veins:    Leg swelling:         Pulmonary    Oxygen at home:    Productive cough:     Wheezing:         Neurologic    Sudden weakness in arms or legs:     Sudden numbness in arms or legs:     Sudden onset of difficulty speaking or slurred speech:    Temporary loss of vision in one eye:      Problems with dizziness:         Gastrointestinal    Blood in stool:     Vomited blood:         Genitourinary    Burning when urinating:     Blood in urine:        Psychiatric    Major depression:         Hematologic    Bleeding problems:    Problems with blood clotting too easily:        Skin    Rashes or ulcers:        Constitutional    Fever or chills:      PHYSICAL EXAM:   Vitals:   08/12/24 0852  BP: 134/77  Pulse: 70  Temp: 98 F (36.7 C)  SpO2: 96%  Weight: 93 lb (42.2 kg)  Height: 5' 3 (1.6 m)    GENERAL: The patient is a well-nourished female, in no acute distress. The vital signs are documented above. CARDIAC: There is a regular rate and rhythm.  VASCULAR: Palpable right posterior tibial pulse.  Multiphasic posterior tibial Doppler signals PULMONARY: Non-labored respirations ABDOMEN: Soft and non-tender with normal pitched bowel sounds.  MUSCULOSKELETAL: There are no major deformities or cyanosis. NEUROLOGIC: No focal weakness or paresthesias are detected. SKIN: There are no ulcers or rashes noted. PSYCHIATRIC: The patient has a normal affect.  STUDIES:   I have reviewed the following ultrasound:  Mesenteric:  70 to 99% stenosis in the superior mesenteric artery and celiac artery.  SMA  velocities just meeting threshold of >70% stenosis category.  Stenting appears patent with mild thickening of the stent walls observed.  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  She has developed progressive stenosis in both arteries.  I have recommended angiography to define the stenosis and to treat her in-stent stenosis if indicated.  PAD: The patient has a palpable right posterior tibial pulse and nonpalpable left but multiphasic Doppler signals.  I am getting ABIs today.  I will also plan on  imaging both of her femoral-popliteal bypass grafts at the time of her mesenteric angiogram.  Plavix  was refilled today.    Malvina Serene CLORE, MD, FACS [facility] [phone] Pager [phone]

## 2024-08-13 ENCOUNTER — Other Ambulatory Visit: Payer: Self-pay

## 2024-08-13 DIAGNOSIS — I70213 Atherosclerosis of native arteries of extremities with intermittent claudication, bilateral legs: Secondary | ICD-10-CM

## 2024-08-21 ENCOUNTER — Telehealth: Payer: Self-pay | Admitting: Gastroenterology

## 2024-08-21 NOTE — Telephone Encounter (Addendum)
 Procedure:Colonoscopy Procedure date: 08/29/24 Procedure location: [facility] Arrival Time: 1:13 pm Spoke with the patient Y/N: Yes Any prep concerns? No  Has the patient obtained the prep from the pharmacy ? Yes Do you have a care partner and transportation: Yes Any additional concerns? No

## 2024-08-22 ENCOUNTER — Encounter (HOSPITAL_COMMUNITY): Payer: Self-pay | Admitting: Gastroenterology

## 2024-08-22 DIAGNOSIS — M47892 Other spondylosis, cervical region: Secondary | ICD-10-CM | POA: Diagnosis not present

## 2024-08-22 NOTE — Progress Notes (Signed)
 Attempted to obtain medical history for pre op call via telephone, unable to reach at this time. HIPAA compliant voicemail message left requesting return call to pre surgical testing department.

## 2024-08-29 ENCOUNTER — Ambulatory Visit (HOSPITAL_COMMUNITY): Admitting: Physician Assistant

## 2024-08-29 ENCOUNTER — Encounter: Admitting: Family Medicine

## 2024-08-29 ENCOUNTER — Encounter (HOSPITAL_COMMUNITY): Admitting: Physician Assistant

## 2024-08-29 ENCOUNTER — Encounter (HOSPITAL_COMMUNITY): Payer: Self-pay | Admitting: Gastroenterology

## 2024-08-29 ENCOUNTER — Other Ambulatory Visit: Payer: Self-pay

## 2024-08-29 ENCOUNTER — Encounter (HOSPITAL_COMMUNITY)
Admission: RE | Disposition: A | Discharge status: Home / Self Care | Payer: Self-pay | Source: Home / Self Care | Attending: Gastroenterology

## 2024-08-29 ENCOUNTER — Ambulatory Visit (HOSPITAL_COMMUNITY)
Admission: RE | Admit: 2024-08-29 | Discharge: 2024-08-29 | Disposition: A | Discharge status: Home / Self Care | Attending: Gastroenterology | Admitting: Gastroenterology

## 2024-08-29 DIAGNOSIS — I498 Other specified cardiac arrhythmias: Secondary | ICD-10-CM | POA: Insufficient documentation

## 2024-08-29 DIAGNOSIS — D124 Benign neoplasm of descending colon: Secondary | ICD-10-CM

## 2024-08-29 DIAGNOSIS — K6289 Other specified diseases of anus and rectum: Secondary | ICD-10-CM

## 2024-08-29 DIAGNOSIS — I701 Atherosclerosis of renal artery: Secondary | ICD-10-CM | POA: Diagnosis not present

## 2024-08-29 DIAGNOSIS — E1151 Type 2 diabetes mellitus with diabetic peripheral angiopathy without gangrene: Secondary | ICD-10-CM | POA: Insufficient documentation

## 2024-08-29 DIAGNOSIS — D123 Benign neoplasm of transverse colon: Secondary | ICD-10-CM | POA: Insufficient documentation

## 2024-08-29 DIAGNOSIS — I129 Hypertensive chronic kidney disease with stage 1 through stage 4 chronic kidney disease, or unspecified chronic kidney disease: Secondary | ICD-10-CM | POA: Diagnosis not present

## 2024-08-29 DIAGNOSIS — L818 Other specified disorders of pigmentation: Secondary | ICD-10-CM

## 2024-08-29 DIAGNOSIS — Z860101 Personal history of adenomatous and serrated colon polyps: Secondary | ICD-10-CM | POA: Diagnosis not present

## 2024-08-29 DIAGNOSIS — Q439 Congenital malformation of intestine, unspecified: Secondary | ICD-10-CM

## 2024-08-29 DIAGNOSIS — I2510 Atherosclerotic heart disease of native coronary artery without angina pectoris: Secondary | ICD-10-CM | POA: Insufficient documentation

## 2024-08-29 DIAGNOSIS — K644 Residual hemorrhoidal skin tags: Secondary | ICD-10-CM | POA: Insufficient documentation

## 2024-08-29 DIAGNOSIS — I10 Essential (primary) hypertension: Secondary | ICD-10-CM | POA: Diagnosis not present

## 2024-08-29 DIAGNOSIS — Z955 Presence of coronary angioplasty implant and graft: Secondary | ICD-10-CM | POA: Insufficient documentation

## 2024-08-29 DIAGNOSIS — Z7902 Long term (current) use of antithrombotics/antiplatelets: Secondary | ICD-10-CM | POA: Insufficient documentation

## 2024-08-29 DIAGNOSIS — F17210 Nicotine dependence, cigarettes, uncomplicated: Secondary | ICD-10-CM | POA: Insufficient documentation

## 2024-08-29 DIAGNOSIS — E039 Hypothyroidism, unspecified: Secondary | ICD-10-CM

## 2024-08-29 DIAGNOSIS — R21 Rash and other nonspecific skin eruption: Secondary | ICD-10-CM | POA: Insufficient documentation

## 2024-08-29 DIAGNOSIS — D125 Benign neoplasm of sigmoid colon: Secondary | ICD-10-CM | POA: Insufficient documentation

## 2024-08-29 DIAGNOSIS — K6389 Other specified diseases of intestine: Secondary | ICD-10-CM | POA: Insufficient documentation

## 2024-08-29 DIAGNOSIS — Q438 Other specified congenital malformations of intestine: Secondary | ICD-10-CM | POA: Insufficient documentation

## 2024-08-29 DIAGNOSIS — K635 Polyp of colon: Secondary | ICD-10-CM | POA: Diagnosis not present

## 2024-08-29 DIAGNOSIS — Z860100 Personal history of colon polyps, unspecified: Secondary | ICD-10-CM

## 2024-08-29 DIAGNOSIS — K641 Second degree hemorrhoids: Secondary | ICD-10-CM | POA: Insufficient documentation

## 2024-08-29 DIAGNOSIS — E1122 Type 2 diabetes mellitus with diabetic chronic kidney disease: Secondary | ICD-10-CM | POA: Insufficient documentation

## 2024-08-29 DIAGNOSIS — Z1211 Encounter for screening for malignant neoplasm of colon: Secondary | ICD-10-CM | POA: Diagnosis not present

## 2024-08-29 DIAGNOSIS — N189 Chronic kidney disease, unspecified: Secondary | ICD-10-CM | POA: Diagnosis not present

## 2024-08-29 DIAGNOSIS — K623 Rectal prolapse: Secondary | ICD-10-CM | POA: Diagnosis not present

## 2024-08-29 HISTORY — PX: COLONOSCOPY: SHX5424

## 2024-08-29 SURGERY — COLONOSCOPY
Anesthesia: Monitor Anesthesia Care

## 2024-08-29 MED ORDER — SODIUM CHLORIDE 0.9 % IV SOLN: INTRAVENOUS | Status: DC

## 2024-08-29 MED ORDER — HYDRALAZINE HCL 20 MG/ML IJ SOLN
5.0000 mg | Freq: Once | INTRAMUSCULAR | Status: AC
  Administered 2024-08-29: 5 mg via INTRAVENOUS

## 2024-08-29 MED ORDER — ACETAMINOPHEN 10 MG/ML IV SOLN
1000.0000 mg | Freq: Once | INTRAVENOUS | Status: AC
  Administered 2024-08-29: 650 mg via INTRAVENOUS
  Filled 2024-08-29: qty 100

## 2024-08-29 MED ORDER — LIDOCAINE HCL (CARDIAC) PF 100 MG/5ML IV SOSY
PREFILLED_SYRINGE | INTRAVENOUS | Status: DC | PRN
  Administered 2024-08-29: 100 mg via INTRAVENOUS

## 2024-08-29 MED ORDER — PROPOFOL 10 MG/ML IV BOLUS
INTRAVENOUS | Status: DC | PRN
  Administered 2024-08-29 (×3): 20 mg via INTRAVENOUS

## 2024-08-29 MED ORDER — PROPOFOL 500 MG/50ML IV EMUL
INTRAVENOUS | Status: DC | PRN
Start: 2024-08-29 — End: 2024-08-29
  Administered 2024-08-29: 125 ug/kg/min via INTRAVENOUS

## 2024-08-29 NOTE — Transfer of Care (Signed)
 Immediate Anesthesia Transfer of Care Note  Patient: Erin [name]  Procedure(s) Performed: COLONOSCOPY  Patient Location: Endoscopy Unit  Anesthesia Type:MAC  Level of Consciousness: awake, alert , and oriented  Airway & Oxygen Therapy: Patient Spontanous Breathing  Post-op Assessment: Report given to RN and Post -op Vital signs reviewed and stable  Post vital signs: Reviewed and stable  Last Vitals:  Vitals Value Taken Time  BP    Temp    Pulse    Resp    SpO2      Last Pain:  Vitals:   08/29/24 1301  TempSrc: Temporal  PainSc: 3          Complications: No notable events documented.

## 2024-08-29 NOTE — Op Note (Signed)
 [facility] Patient Name: Erin [name] Procedure Date: 08/29/2024 MRN: [id number] Attending MD: Aloha Finner , MD, [id number] Date of Birth: [birth year] CSN: [id number] Age: 68 Admit Type: Outpatient Procedure:                Colonoscopy Indications:              Excision of colonic polyp Providers:                Aloha Finner, MD, Hoy Penner, RN, Lorrayne Kitty, Technician Referring MD:              Medicines:                Monitored Anesthesia Care Complications:            No immediate complications. Estimated Blood Loss:     Estimated blood loss was minimal. Procedure:                Pre-Anesthesia Assessment:                           - Prior to the procedure, a History and Physical                            was performed, and patient medications and                            allergies were reviewed. The patient's tolerance of                            previous anesthesia was also reviewed. The risks                            and benefits of the procedure and the sedation                            options and risks were discussed with the patient.                            All questions were answered, and informed consent                            was obtained. Prior Anticoagulants: The patient has                            taken Plavix  (clopidogrel ), last dose was 5 days                            prior to procedure. ASA Grade Assessment: III - A                            patient with severe systemic disease. After  reviewing the risks and benefits, the patient was                            deemed in satisfactory condition to undergo the                            procedure.                           After obtaining informed consent, the colonoscope                            was passed under direct vision. Throughout the                            procedure, the patient's blood  pressure, pulse, and                            oxygen saturations were monitored continuously. The                            [id number]-[id number] ([id number]) colonoscope was introduced                            through the anus and advanced to the the cecum,                            identified by appendiceal orifice and ileocecal                            valve. The colonoscopy was somewhat difficult due                            to restricted mobility of the colon and a tortuous                            colon. Successful completion of the procedure was                            aided by changing the patient's position, using                            manual pressure, straightening and shortening the                            scope to obtain bowel loop reduction and using                            scope torsion. The patient tolerated the procedure.                            The quality of the bowel preparation was adequate.  The ileocecal valve, appendiceal orifice, and                            rectum were photographed. Findings:      Skin tags were found on perianal exam.      The perianal exam findings include a perianal rash.      The digital rectal exam findings include hemorrhoids. Pertinent       negatives include no palpable rectal lesions.      The colon (entire examined portion) was significantly tortuous.      A scattered area of moderately erythematous mucosa was found in the       transverse colon, at the hepatic flexure, in the ascending colon and in       the cecum. Biopsies were taken with a cold forceps for histology.      A small scar was found in the transverse colon. There was residual       polypoid tissue (approximately 10 mm in size). The polypoid tissue was       removed with a cold snare. Resection and retrieval were complete.      A tattoo was seen in the transverse colon.      Five sessile and semi-sessile polyps were found in the  sigmoid colon       (2), descending colon (1) and transverse colon (2). The polyps were 2 to       6 mm in size. These polyps were removed with a cold snare. Resection and       retrieval were complete.      Normal mucosa was found in the recto-sigmoid colon, in the sigmoid colon       and in the descending colon. Biopsies were taken with a cold forceps for       histology.      A diffuse area of moderately erythematous mucosa was found in the       rectum. Biopsies were taken with a cold forceps for histology.      Non-bleeding non-thrombosed internal hemorrhoids were found during       perianal exam, during digital exam and during endoscopy. The hemorrhoids       were Grade II (internal hemorrhoids that prolapse but reduce       spontaneously). Impression:               - Perianal rash noted. Skin tags noted. Hemorrhoids                            found on digital rectal exam.                           - Significantly tortuous colon.                           - Erythematous mucosa in the transverse colon, at                            the hepatic flexure, in the ascending colon and in                            the cecum. Biopsied.                           -  Scar in the transverse colon. Polypoid tissue                            present that was removed with cold snare.                           - A tattoo was seen in the transverse colon.                           - Five 2 to 6 mm polyps in the sigmoid colon, in                            the descending colon and in the transverse colon,                            removed with a cold snare. Resected and retrieved.                           - Normal mucosa in the recto-sigmoid colon, in the                            sigmoid colon and in the descending colon. Biopsied.                           - Erythematous mucosa in the rectum. Biopsied.                           - Non-bleeding non-thrombosed internal hemorrhoids. Moderate  Sedation:      Not Applicable - Patient had care per Anesthesia. Recommendation:           - The patient will be observed post-procedure,                            until all discharge criteria are met.                           - Discharge patient to home.                           - Patient has a contact number available for                            emergencies. The signs and symptoms of potential                            delayed complications were discussed with the                            patient. Return to normal activities tomorrow.                            Written discharge instructions were provided to the  patient.                           - High fiber diet.                           - Use FiberCon 1-2 tablets PO daily.                           - May restart Plavix  on 10/11.                           - Continue present medications.                           - Await pathology results.                           - Repeat colonoscopy in next 1-year for                            surveillance (due to number of polyps removed                            within the last year on 2 colonoscopies) is                            reasonable. Recommend Abdominal binder and                            pediatric colonoscope (she is also undergoing                            upcoming diastasis hernia surgery so this will                            likely aid in some of the difficulty of her                            colonoscopy). If chronic colitis is found on                            pathology, this may require additional therapies.                           - The findings and recommendations were discussed                            with the patient.                           - The findings and recommendations were discussed                            with the designated responsible adult. Procedure Code(s):        --- Professional ---  54614, Colonoscopy, flexible; with removal of                            tumor(s), polyp(s), or other lesion(s) by snare                            technique                           45380, 59, Colonoscopy, flexible; with biopsy,                            single or multiple Diagnosis Code(s):        --- Professional ---                           K63.89, Other specified diseases of intestine                           K62.89, Other specified diseases of anus and rectum                           K64.1, Second degree hemorrhoids                           D12.5, Benign neoplasm of sigmoid colon                           D12.4, Benign neoplasm of descending colon                           D12.3, Benign neoplasm of transverse colon (hepatic                            flexure or splenic flexure)                           K63.5, Polyp of colon                           Q43.8, Other specified congenital malformations of                            intestine CPT copyright 2022 American Medical Association. All rights reserved. The codes documented in this report are preliminary and upon coder review may  be revised to meet current compliance requirements. Aloha Finner, MD 08/29/2024 3:26:41 PM Number of Addenda: 0

## 2024-08-29 NOTE — Anesthesia Procedure Notes (Signed)
 Date/Time: 08/29/2024 2:15 PM  Performed by: Therisa Doyal CROME, CRNAOxygen Delivery Method: Simple face mask

## 2024-08-29 NOTE — H&P (Signed)
 GASTROENTEROLOGY PROCEDURE H&P NOTE   Primary Care Physician: [name] HERO, MD  HPI: Erin [name] is a 68 y.o. female who presents for Colonoscopy for attempt at resection of incomplete polypectomy in HF/TC region (previous large adenoma removed years ago) and difficult colonoscopy positioning.  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/14 60.4Gy/11fx and chemo   Anxiety    Aortoiliac occlusive disease (HCC) 08/14/2017   Arthritis    B12 deficiency anemia 09/14/2015   Blood transfusion without reported diagnosis    BPPV (benign paroxysmal positional vertigo) 07/08/2015   Cardiomyopathy (HCC) 11/03/2017   Chronic back pain    Chronic daily headache 03/29/2013   takes bc powder   Closed right hip fracture (HCC) 09/10/2015   Coronary artery disease involving native coronary artery of native heart without angina pectoris 10/13/2017   DES to mid RCA   Depression    Essential (hemorrhagic) thrombocythemia (HCC) 09/06/2018   Essential hypertension 09/25/2023   Family history of early CAD 04/08/2016   GERD (gastroesophageal reflux disease)    History of hiatal hernia    Hot flashes    Hyperlipidemia    Hyperlipidemia associated with type 2 diabetes mellitus (HCC) 05/11/2017   Hypothyroidism 09/10/2015   IBS (irritable bowel syndrome) 03/29/2013   Neck pain 07/08/2015   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   takes neurotin   Peripheral vascular disease 11/03/2017   QT prolongation    Sacroiliitis 09/06/2018   Spasms of the hands or feet 10/08/2017   Syncope 06/07/2016   Tubular adenoma of colon 09/08/2003   Vertigo    Wears glasses    Past Surgical History:  Procedure Laterality Date   ABDOMINAL AORTOGRAM N/A 08/02/2017   Procedure: ABDOMINAL AORTOGRAM;  Surgeon: Darron Deatrice LABOR, MD;  Location: [facility];  Service: Cardiovascular;  Laterality: N/A;    AORTA - BILATERAL FEMORAL ARTERY BYPASS GRAFT N/A 08/14/2017   Procedure: AORTA BIFEMORAL BYPASS GRAFT;  Surgeon: Oris Krystal FALCON, MD;  Location: [facility] OR;  Service: Vascular;  Laterality: N/A;   COLONOSCOPY     CORONARY STENT INTERVENTION N/A 10/13/2017   Procedure: CORONARY STENT INTERVENTION;  Surgeon: Mady Bruckner, MD;  Location: [facility];  Service: Cardiovascular;  Laterality: N/A;   dental implant     ECTOPIC PREGNANCY SURGERY     EMBOLECTOMY N/A 08/14/2017   Procedure: EMBOLECTOMY FEMORAL;  Surgeon: Oris Krystal FALCON, MD;  Location: [facility] OR;  Service: Vascular;  Laterality: N/A;   FEMORAL-POPLITEAL BYPASS GRAFT Right 08/14/2017   Procedure: Right Femoral to Above Knee Popliteal Bypass Graft using Non-Reversed Greater Saphenous Vein Graft from Right Leg;  Surgeon: Oris Krystal FALCON, MD;  [facility];  Service: Vascular;  Laterality: Right;   FLEXIBLE SIGMOIDOSCOPY N/A 08/14/2013   Procedure: FLEXIBLE SIGMOIDOSCOPY;  Surgeon: Gwendlyn ONEIDA Buddy, MD;  Location: [facility];  Service: Endoscopy;  Laterality: N/A;   LEFT HEART CATH AND CORONARY ANGIOGRAPHY N/A 10/13/2017   Procedure: LEFT HEART CATH AND CORONARY ANGIOGRAPHY;  Surgeon: Mady Bruckner, MD;  Location: [facility];  Service: Cardiovascular;  Laterality: N/A;   LOWER EXTREMITY ANGIOGRAPHY Bilateral 08/02/2017   Procedure: Lower Extremity Angiography;  Surgeon: Darron Deatrice LABOR, MD;  Location: [facility];  Service: Cardiovascular;  Laterality: Bilateral;   MULTIPLE TOOTH EXTRACTIONS     PERIPHERAL VASCULAR INTERVENTION  05/22/2018   Procedure: PERIPHERAL VASCULAR INTERVENTION;  Surgeon: Serene Gaile ORN, MD;  Location: [facility];  Service: Cardiovascular;;  SMA and Celiac   PILONIDAL CYST EXCISION     POLYPECTOMY     THROMBECTOMY FEMORAL ARTERY Right 08/14/2017   Procedure: THROMBECTOMY FEMORAL ARTERY;  Surgeon: Oris Krystal FALCON, MD;  Location: [facility] OR;  Service: Vascular;  Laterality: Right;   TONSILLECTOMY      TOTAL HIP ARTHROPLASTY  09/11/2015   Procedure: TOTAL HIP ARTHROPLASTY;  Surgeon: Evalene JONETTA Chancy, MD;  Location: [facility] OR;  Service: Orthopedics;;   ULTRASOUND GUIDANCE FOR VASCULAR ACCESS  10/13/2017   Procedure: Ultrasound Guidance For Vascular Access;  Surgeon: Mady Bruckner, MD;  Location: [facility];  Service: Cardiovascular;;   VISCERAL ANGIOGRAPHY N/A 03/03/2020   Procedure: MESTENRIC ANGIOGRAPHY;  Surgeon: Serene Gaile ORN, MD;  Location: [facility];  Service: Cardiovascular;  Laterality: N/A;   Current Facility-Administered Medications  Medication Dose Route Frequency Provider Last Rate Last Admin   0.9 %  sodium chloride  infusion   Intravenous Continuous Mansouraty, Aloha Raddle., MD        Current Facility-Administered Medications:    0.9 %  sodium chloride  infusion, , Intravenous, Continuous, Mansouraty, Aloha Raddle., MD Allergies  Allergen Reactions   Amitiza  [Lubiprostone ] Diarrhea    Uncontrollable diarrhea   Nortriptyline  Other (See Comments)    Stomach distention   Sulfa Antibiotics Nausea And Vomiting and Other (See Comments)    GI distress/pain   Tramadol  Itching and Rash   Atorvastatin  Nausea And Vomiting   Claritin [Loratadine] Other (See Comments)    Hot flashes   Family History  Problem Relation Age of Onset   Dementia Mother    Arthritis Mother    Hyperlipidemia Mother    Heart disease Mother    Hypertension Mother    Stroke Mother 20   Irritable bowel syndrome Mother    Thyroid  disease Mother    Heart attack Mother    Heart disease Father    Hyperlipidemia Father    Hypertension Father    Stroke Father 70   Thyroid  disease Father    Prostate cancer Father    Heart attack Father    Lung cancer Brother 60   Heart attack Maternal Uncle    Heart attack Maternal Grandfather    Colon cancer Neg Hx    Rectal cancer Neg Hx    Stomach cancer Neg Hx    Esophageal cancer Neg Hx    Social History   Socioeconomic History   Marital  status: Married    Spouse name: Not on file   Number of children: 0   Years of education: Not on file   Highest education level: Not on file  Occupational History   Occupation: caregiver  Tobacco Use   Smoking status: Every Day    Current packs/day: 0.00    Types: Cigarettes    Last attempt to quit: 10/07/2014    Years since quitting: 9.9   Smokeless tobacco: Never  Vaping Use   Vaping status: Never Used  Substance and Sexual Activity   Alcohol use: No    Alcohol/week: 0.0 standard drinks of alcohol   Drug use: No   Sexual activity: Yes    Partners: Male    Birth control/protection: Post-menopausal  Other Topics Concern   Not on file  Social History Narrative   Work or School: homemaker      Home Situation: lives with her husband,[name] takes care of her elderly parents  Spiritual Beliefs: Christian      Lifestyle: no regular exercise, poor diet      Right handed             Social Drivers of Health   Financial Resource Strain: Low Risk  (02/26/2024)   Overall Financial Resource Strain (CARDIA)    Difficulty of Paying Living Expenses: Not hard at all  Food Insecurity: No Food Insecurity (02/26/2024)   Hunger Vital Sign    Worried About Running Out of Food in the Last Year: Never true    Ran Out of Food in the Last Year: Never true  Transportation Needs: No Transportation Needs (02/26/2024)   PRAPARE - Administrator, Civil Service (Medical): No    Lack of Transportation (Non-Medical): No  Physical Activity: Inactive (02/26/2024)   Exercise Vital Sign    Days of Exercise per Week: 0 days    Minutes of Exercise per Session: 0 min  Stress: No Stress Concern Present (02/26/2024)   [employer] of Occupational Health - Occupational Stress Questionnaire    Feeling of Stress : Not at all  Social Connections: Socially Integrated (02/26/2024)   Social Connection and Isolation Panel    Frequency of Communication with Friends and Family: More than three  times a week    Frequency of Social Gatherings with Friends and Family: More than three times a week    Attends Religious Services: More than 4 times per year    Active Member of [employer] or Organizations: Yes    Attends Banker Meetings: More than 4 times per year    Marital Status: Married  Catering manager Violence: Not At Risk (02/26/2024)   Humiliation, Afraid, Rape, and Kick questionnaire    Fear of Current or Ex-Partner: No    Emotionally Abused: No    Physically Abused: No    Sexually Abused: No    Physical Exam: There were no vitals filed for this visit. There is no height or weight on file to calculate BMI. GEN: NAD EYE: Sclerae anicteric ENT: MMM CV: Non-tachycardic GI: Soft, NT/ND NEURO:  Alert & Oriented x 3  Lab Results: No results for input(s): WBC, HGB, HCT, PLT in the last 72 hours. BMET No results for input(s): NA, K, CL, CO2, GLUCOSE, BUN, CREATININE, CALCIUM  in the last 72 hours. LFT No results for input(s): PROT, ALBUMIN, AST, ALT, ALKPHOS, BILITOT, BILIDIR, IBILI in the last 72 hours. PT/INR No results for input(s): LABPROT, INR in the last 72 hours.   Impression / Plan: This is a 68 y.o.female who presents for Colonoscopy for attempt at resection of incomplete polypectomy in HF/TC region (previous large adenoma removed years ago) and difficult colonoscopy positioning.  The risks and benefits of endoscopic evaluation/treatment were discussed with the patient and/or family; these include but are not limited to the risk of perforation, infection, bleeding, missed lesions, lack of diagnosis, severe illness requiring hospitalization, as well as anesthesia and sedation related illnesses.  The patient's history has been reviewed, patient examined, no change in status, and deemed stable for procedure.  The patient and/or family is agreeable to proceed.    Aloha Finner, MD [place]  [facility] Office # [phone]

## 2024-08-29 NOTE — Discharge Instructions (Signed)

## 2024-08-29 NOTE — Anesthesia Preprocedure Evaluation (Addendum)
 Anesthesia Evaluation  Patient identified by MRN, date of birth, ID band Patient awake    Reviewed: Allergy & Precautions, NPO status , Patient's Chart, lab work & pertinent test results  History of Anesthesia Complications Negative for: history of anesthetic complications  Airway Mallampati: I       Dental  (+) Teeth Intact, Dental Advisory Given, Lower Dentures   Pulmonary Current Smoker and Patient abstained from smoking.   breath sounds clear to auscultation       Cardiovascular hypertension, + CAD, + Cardiac Stents and + Peripheral Vascular Disease  + dysrhythmias  Rhythm:Regular Rate:Normal     Neuro/Psych    GI/Hepatic ,GERD  ,,  Endo/Other  diabetesHypothyroidism    Renal/GU Renal hypertensionRenal diseaseRenal Artery Stenosis     Musculoskeletal  (+) Arthritis ,    Abdominal   Peds  Hematology   Anesthesia Other Findings   Reproductive/Obstetrics                              Anesthesia Physical Anesthesia Plan  ASA: 3  Anesthesia Plan: MAC   Post-op Pain Management:    Induction: Intravenous  PONV Risk Score and Plan: 1  Airway Management Planned: Simple Face Mask  Additional Equipment:   Intra-op Plan:   Post-operative Plan:   Informed Consent:      Dental advisory given  Plan Discussed with: CRNA and Surgeon  Anesthesia Plan Comments: (Patient with known HTN secondary to severe bilateral renal artery stenosis. Patient anxious on arrival likely also contributing to SBP of 190s. Will continue to monitor. Plan for MAC. )         Anesthesia Quick Evaluation

## 2024-08-30 ENCOUNTER — Encounter (HOSPITAL_COMMUNITY): Payer: Self-pay | Admitting: Gastroenterology

## 2024-08-30 LAB — SURGICAL PATHOLOGY

## 2024-08-30 NOTE — Anesthesia Postprocedure Evaluation (Signed)
 Anesthesia Post Note  Patient: Erin [name]  Procedure(s) Performed: COLONOSCOPY     Patient location during evaluation: Endoscopy Anesthesia Type: MAC Level of consciousness: awake Pain management: pain level controlled Vital Signs Assessment: post-procedure vital signs reviewed and stable Respiratory status: spontaneous breathing Cardiovascular status: blood pressure returned to baseline Postop Assessment: no apparent nausea or vomiting Anesthetic complications: no   No notable events documented.  Last Vitals:  Vitals:   08/29/24 1635 08/29/24 1640  BP: (!) 195/59 (!) 195/70  Pulse: 74 70  Resp: 19 16  Temp:    SpO2: 99% 99%    Last Pain:  Vitals:   08/29/24 1640  TempSrc:   PainSc: 4                  Lauraine KATHEE [place]

## 2024-09-03 ENCOUNTER — Other Ambulatory Visit: Payer: Self-pay

## 2024-09-03 ENCOUNTER — Encounter (HOSPITAL_COMMUNITY)
Admission: RE | Disposition: A | Discharge status: Home / Self Care | Payer: Self-pay | Source: Home / Self Care | Attending: Surgery

## 2024-09-03 ENCOUNTER — Ambulatory Visit (HOSPITAL_COMMUNITY)
Admission: RE | Admit: 2024-09-03 | Discharge: 2024-09-03 | Disposition: A | Discharge status: Home / Self Care | Attending: Surgery | Admitting: Surgery

## 2024-09-03 DIAGNOSIS — E7800 Pure hypercholesterolemia, unspecified: Secondary | ICD-10-CM | POA: Diagnosis not present

## 2024-09-03 DIAGNOSIS — T82858A Stenosis of vascular prosthetic devices, implants and grafts, initial encounter: Secondary | ICD-10-CM

## 2024-09-03 DIAGNOSIS — Z7902 Long term (current) use of antithrombotics/antiplatelets: Secondary | ICD-10-CM | POA: Diagnosis not present

## 2024-09-03 DIAGNOSIS — Z85048 Personal history of other malignant neoplasm of rectum, rectosigmoid junction, and anus: Secondary | ICD-10-CM | POA: Insufficient documentation

## 2024-09-03 DIAGNOSIS — K551 Chronic vascular disorders of intestine: Secondary | ICD-10-CM | POA: Insufficient documentation

## 2024-09-03 DIAGNOSIS — Y839 Surgical procedure, unspecified as the cause of abnormal reaction of the patient, or of later complication, without mention of misadventure at the time of the procedure: Secondary | ICD-10-CM | POA: Diagnosis not present

## 2024-09-03 DIAGNOSIS — Z923 Personal history of irradiation: Secondary | ICD-10-CM | POA: Insufficient documentation

## 2024-09-03 DIAGNOSIS — I10 Essential (primary) hypertension: Secondary | ICD-10-CM | POA: Insufficient documentation

## 2024-09-03 DIAGNOSIS — E1151 Type 2 diabetes mellitus with diabetic peripheral angiopathy without gangrene: Secondary | ICD-10-CM | POA: Diagnosis not present

## 2024-09-03 DIAGNOSIS — Z95828 Presence of other vascular implants and grafts: Secondary | ICD-10-CM | POA: Diagnosis not present

## 2024-09-03 DIAGNOSIS — Z79899 Other long term (current) drug therapy: Secondary | ICD-10-CM | POA: Diagnosis not present

## 2024-09-03 DIAGNOSIS — I739 Peripheral vascular disease, unspecified: Secondary | ICD-10-CM

## 2024-09-03 DIAGNOSIS — F17210 Nicotine dependence, cigarettes, uncomplicated: Secondary | ICD-10-CM | POA: Diagnosis not present

## 2024-09-03 DIAGNOSIS — I708 Atherosclerosis of other arteries: Secondary | ICD-10-CM | POA: Insufficient documentation

## 2024-09-03 DIAGNOSIS — Z9221 Personal history of antineoplastic chemotherapy: Secondary | ICD-10-CM | POA: Insufficient documentation

## 2024-09-03 DIAGNOSIS — T82856A Stenosis of peripheral vascular stent, initial encounter: Secondary | ICD-10-CM | POA: Diagnosis not present

## 2024-09-03 DIAGNOSIS — I2510 Atherosclerotic heart disease of native coronary artery without angina pectoris: Secondary | ICD-10-CM | POA: Diagnosis not present

## 2024-09-03 HISTORY — PX: VISCERAL ARTERY INTERVENTION: CATH118277

## 2024-09-03 HISTORY — PX: VISCERAL ANGIOGRAPHY: CATH118276

## 2024-09-03 HISTORY — PX: ABDOMINAL AORTOGRAM W/LOWER EXTREMITY: CATH118223

## 2024-09-03 HISTORY — PX: LOWER EXTREMITY INTERVENTION: CATH118252

## 2024-09-03 LAB — POCT I-STAT, CHEM 8
BUN: 21 mg/dL (ref 8–23)
Calcium, Ion: 1.2 mmol/L (ref 1.15–1.40)
Chloride: 107 mmol/L (ref 98–111)
Creatinine, Ser: 1.7 mg/dL — ABNORMAL HIGH (ref 0.44–1.00)
Glucose, Bld: 102 mg/dL — ABNORMAL HIGH (ref 70–99)
HCT: 27 % — ABNORMAL LOW (ref 36.0–46.0)
Hemoglobin: 9.2 g/dL — ABNORMAL LOW (ref 12.0–15.0)
Potassium: 2.8 mmol/L — ABNORMAL LOW (ref 3.5–5.1)
Sodium: 143 mmol/L (ref 135–145)
TCO2: 22 mmol/L (ref 22–32)

## 2024-09-03 SURGERY — ABDOMINAL AORTOGRAM W/LOWER EXTREMITY
Anesthesia: LOCAL

## 2024-09-03 MED ORDER — CLOPIDOGREL BISULFATE 75 MG PO TABS
ORAL_TABLET | ORAL | Status: AC
  Filled 2024-09-03: qty 2

## 2024-09-03 MED ORDER — FENTANYL CITRATE (PF) 100 MCG/2ML IJ SOLN
INTRAMUSCULAR | Status: AC
  Filled 2024-09-03: qty 2

## 2024-09-03 MED ORDER — OXYCODONE HCL 5 MG PO TABS: 5.0000 mg | ORAL_TABLET | ORAL | Status: DC | PRN

## 2024-09-03 MED ORDER — ONDANSETRON HCL 4 MG/2ML IJ SOLN
INTRAMUSCULAR | Status: DC | PRN
  Administered 2024-09-03: 4 mg via INTRAVENOUS

## 2024-09-03 MED ORDER — LIDOCAINE HCL (PF) 1 % IJ SOLN
INTRAMUSCULAR | Status: DC | PRN
  Administered 2024-09-03: 12 mL

## 2024-09-03 MED ORDER — SODIUM CHLORIDE 0.9% FLUSH: 3.0000 mL | INTRAVENOUS | Status: DC | PRN

## 2024-09-03 MED ORDER — MORPHINE SULFATE (PF) 2 MG/ML IV SOLN: 2.0000 mg | INTRAVENOUS | Status: DC | PRN

## 2024-09-03 MED ORDER — SODIUM CHLORIDE 0.9 % IV SOLN: INTRAVENOUS | Status: DC

## 2024-09-03 MED ORDER — HEPARIN (PORCINE) IN NACL 1000-0.9 UT/500ML-% IV SOLN
INTRAVENOUS | Status: DC | PRN
  Administered 2024-09-03 (×2): 500 mL

## 2024-09-03 MED ORDER — IODIXANOL 320 MG/ML IV SOLN
INTRAVENOUS | Status: DC | PRN
  Administered 2024-09-03: 45 mL

## 2024-09-03 MED ORDER — HYDRALAZINE HCL 20 MG/ML IJ SOLN: 5.0000 mg | INTRAMUSCULAR | Status: DC | PRN

## 2024-09-03 MED ORDER — HEPARIN SODIUM (PORCINE) 1000 UNIT/ML IJ SOLN
INTRAMUSCULAR | Status: DC | PRN
Start: 2024-09-03 — End: 2024-09-03
  Administered 2024-09-03: 4000 [IU] via INTRAVENOUS

## 2024-09-03 MED ORDER — SODIUM CHLORIDE 0.9 % IV SOLN: 250.0000 mL | INTRAVENOUS | Status: DC | PRN

## 2024-09-03 MED ORDER — ACETAMINOPHEN 325 MG PO TABS: 650.0000 mg | ORAL_TABLET | ORAL | Status: DC | PRN

## 2024-09-03 MED ORDER — SODIUM CHLORIDE 0.9% FLUSH: 3.0000 mL | Freq: Two times a day (BID) | INTRAVENOUS | Status: DC

## 2024-09-03 MED ORDER — ONDANSETRON HCL 4 MG/2ML IJ SOLN: 4.0000 mg | Freq: Four times a day (QID) | INTRAMUSCULAR | Status: DC | PRN

## 2024-09-03 MED ORDER — FENTANYL CITRATE (PF) 100 MCG/2ML IJ SOLN
INTRAMUSCULAR | Status: DC | PRN
  Administered 2024-09-03: 50 ug via INTRAVENOUS

## 2024-09-03 MED ORDER — CLOPIDOGREL BISULFATE 300 MG PO TABS
ORAL_TABLET | ORAL | Status: DC | PRN
  Administered 2024-09-03: 150 mg via ORAL

## 2024-09-03 MED ORDER — SODIUM CHLORIDE 0.9 % WEIGHT BASED INFUSION: 1.0000 mL/kg/h | INTRAVENOUS | Status: DC

## 2024-09-03 MED ORDER — LABETALOL HCL 5 MG/ML IV SOLN: 10.0000 mg | INTRAVENOUS | Status: DC | PRN

## 2024-09-03 MED ORDER — LIDOCAINE HCL (PF) 1 % IJ SOLN
INTRAMUSCULAR | Status: AC
  Filled 2024-09-03: qty 30

## 2024-09-03 MED ORDER — HEPARIN SODIUM (PORCINE) 1000 UNIT/ML IJ SOLN
INTRAMUSCULAR | Status: AC
  Filled 2024-09-03: qty 10

## 2024-09-03 MED ORDER — MIDAZOLAM HCL 2 MG/2ML IJ SOLN
INTRAMUSCULAR | Status: AC
  Filled 2024-09-03: qty 2

## 2024-09-03 MED ORDER — MIDAZOLAM HCL 2 MG/2ML IJ SOLN
INTRAMUSCULAR | Status: DC | PRN
  Administered 2024-09-03: 1 mg via INTRAVENOUS

## 2024-09-03 SURGICAL SUPPLY — 19 items
BALLOON STERLING OTW 7X20X135 (BALLOONS) IMPLANT
CATH OMNI FLUSH 5F 65CM (CATHETERS) IMPLANT
COVER DOME SNAP 22 D (MISCELLANEOUS) IMPLANT
DCB RANGER 5.0X40 135 (BALLOONS) IMPLANT
DEVICE INFLATION ATRION QL2530 (MISCELLANEOUS) IMPLANT
DEVICE VASC CLSR CELT ART 5 (Vascular Products) IMPLANT
DEVICE VASC CLSR CELT ART 6 (Vascular Products) IMPLANT
KIT MICROPUNCTURE NIT STIFF (SHEATH) IMPLANT
KIT PV (KITS) ×3 IMPLANT
KIT SINGLE USE MANIFOLD (KITS) IMPLANT
SET ATX-X65L (MISCELLANEOUS) IMPLANT
SHEATH CATAPULT 5F 45 MP (SHEATH) IMPLANT
SHEATH PINNACLE 5F 10CM (SHEATH) IMPLANT
SHEATH PINNACLE 6F 10CM (SHEATH) IMPLANT
SHEATH PROBE COVER 6X72 (BAG) IMPLANT
STOPCOCK MORSE 400PSI 3WAY (MISCELLANEOUS) IMPLANT
WIRE BENTSON .035X145CM (WIRE) IMPLANT
WIRE G V18X300CM (WIRE) IMPLANT
WIRE ROSEN-J .035X180CM (WIRE) IMPLANT

## 2024-09-03 NOTE — Progress Notes (Signed)
 Notified Delon of abnormal labs- creat 1.7. K + 2.8. Delon stated Dr. Serene is aware and no new orders given.

## 2024-09-03 NOTE — Interval H&P Note (Signed)
 History and Physical Interval Note:  09/03/2024 8:52 AM  Erin [name]  has presented today for surgery, with the diagnosis of pad.  The various methods of treatment have been discussed with the patient and family. After consideration of risks, benefits and other options for treatment, the patient has consented to  Procedure(s): ABDOMINAL AORTOGRAM W/LOWER EXTREMITY (N/A) Lower Extremity Angiography (N/A) VISCERAL ANGIOGRAPHY (N/A) VISCERAL ARTERY INTERVENTION (N/A) LOWER EXTREMITY INTERVENTION (N/A) as a surgical intervention.  The patient's history has been reviewed, patient examined, no change in status, stable for surgery.  I have reviewed the patient's chart and labs.  Questions were answered to the patient's satisfaction.     Malvina New

## 2024-09-03 NOTE — Op Note (Signed)
 Patient name: Erin [name] MRN: [id number] DOB: 07/21/1956 Sex: female  09/03/2024 Pre-operative Diagnosis: Mesenteric stent stenosis Post-operative diagnosis:  Same Surgeon:  Malvina New Procedure Performed:  1.  Ultrasound-guided access, right femoral artery  2.  Abdominal aortogram with CO2  3.  Bilateral lower extremity angiogram  4.  Selective injection with catheter and celiac artery and superior mesenteric artery  5.  Drug-coated balloon angioplasty, celiac artery  6.  Balloon angioplasty, supra mesenteric artery  7.  Conscious sedation, 57 minutes  8.  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  She has a history of an aortobifemoral bypass graft and bilateral femoral-popliteal bypass graft.  She comes in today for imaging and possible intervention  Procedure:  The patient was identified in the holding area and taken to room 8.  The patient was then placed supine on the table and prepped and draped in the usual sterile fashion.  A time out was called.  Conscious sedation was administered with the use of IV fentanyl  and Versed  under continuous physician and nurse monitoring.  Heart rate, blood pressure, and oxygen saturation were continuously monitored.  Total sedation time was 57 minutes.  Ultrasound was used to evaluate the right common femoral artery.  It was patent .  A digital ultrasound image was acquired.  A micropuncture needle was used to access the right common femoral artery under ultrasound guidance.  An 018 wire was advanced without resistance and a micropuncture sheath was placed.  The 018 wire was removed and a benson wire was placed.  The micropuncture sheath was exchanged for a 5 french sheath.  An omniflush catheter was advanced over the wire to the level of L-1.  An abdominal angiogram with CO2 was obtained.  Next,  the Omni Flush catheter was pulled down to the bifurcation and bilateral runoff with CO2 was performed.  I then performed a lateral aortogram with CO2 Findings:   Aortogram: Greater than 60% in-stent stenosis in the celiac and superior mesenteric artery.  The infrarenal aorta and aortobifemoral bypass graft are widely patent as are bilateral femoral arteries.  Right Lower Extremity: Femoral-popliteal bypass graft is patent.  The proximal 3 tibial vessels are visualized and patent  Left Lower Extremity: Femoral-popliteal bypass graft is patent.  The proximal 3 tibial vessels are visualized and patent.  Intervention: After the above images were acquired the decision was made to proceed with intervention.  A 5 French 45 cm sheath was placed.  The patient was fully heparinized.  I tried to cannulate the super mesenteric artery stent I was able to do so however I could not get anything to track from the groin.  When I lost wire access, and trying to recannulated the wire easily went into the celiac artery and I was able to advance a V18 out into the splenic artery.  I then selected a 5 x 40 drug-coated Ranger balloon and perform drug-coated angioplasty of the in-stent stenosis.  Follow-up imaging showed improved but suboptimal result and so I inserted a 7 x 20 Sterling balloon and repeated angioplasty of the in-stent stenosis with follow-up imaging without recurrent stenosis.  Next, I was able to cannulate the SMA with a V-18 and get the wire out distally enough.  I then inserted a 7 x 20 Sterling balloon and performed balloon angioplasty of the in-stent stenosis.  Follow-up  imaging showed resolution of the stenosis.  I was satisfied with these results and elected to stop.  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  The celiac  was treated with a 5 x 40 Ranger balloon followed by a 7 x 20 Sterling balloon.  The SMA was treated with a 7 x 20 balloon.  There was no residual stenosis  #2  Patent bilateral femoral-popliteal bypass grafts    V. Malvina New, M.D., [facility] [place] Office: [phone] Pager:  [phone]

## 2024-09-04 ENCOUNTER — Ambulatory Visit: Payer: Self-pay | Admitting: Gastroenterology

## 2024-09-04 ENCOUNTER — Encounter (HOSPITAL_COMMUNITY): Payer: Self-pay | Admitting: Surgery

## 2024-09-05 ENCOUNTER — Telehealth: Payer: Self-pay

## 2024-09-05 NOTE — Telephone Encounter (Signed)
-----   Message from Inocente [name] Hausen sent at 09/04/2024 10:20 PM EDT ----- Pod B -  Please schedule Ms. Smithers for a repeat colonoscopy in 1 year.  She has a technically complex colonoscopy due to anatomy.  Please make a note on the recall that I am requesting that the next colonoscopy be scheduled in a 1 hour time slot.  Thanks,  Inocente ----- Message ----- From: Anitra Odetta CROME, RN Sent: 09/04/2024   4:29 PM EDT To: Heron [name] Sharper, MD; Inocente [name] Hausen, MD

## 2024-09-05 NOTE — Telephone Encounter (Signed)
 Recall placed

## 2024-09-09 ENCOUNTER — Telehealth (HOSPITAL_BASED_OUTPATIENT_CLINIC_OR_DEPARTMENT_OTHER): Payer: Self-pay | Admitting: *Deleted

## 2024-09-09 ENCOUNTER — Ambulatory Visit: Payer: Self-pay | Admitting: General Surgery

## 2024-09-09 DIAGNOSIS — K432 Incisional hernia without obstruction or gangrene: Secondary | ICD-10-CM | POA: Diagnosis not present

## 2024-09-09 NOTE — H&P (Signed)
 Chief Complaint: New Problem  (discuss robotic hernia repair )     History of Present Illness: Erin [name] is a 68 y.o. female who is seen today as an office consultation at the request of Dr. Ozell for evaluation of New Problem  (discuss robotic hernia repair ) .   History of Present Illness Erin [name] is a 68 year old female who presents with a hernia. She was referred by Dr. Signe for evaluation of her hernia.  Abdominal wall hernia - Hernia present for 2 years, initially developed after lifting activities during a move - Progressive increase in size associated with continued physical activity - Initial pain at onset, currently no pain associated with the hernia  Post-procedural symptoms - Recent kidney stent placement for stenosis - Colonoscopy performed 2 weeks ago - Soreness present following recent procedures, not associated with hernia  Vascular surgical history - Bilateral femoral bypass surgery in 2014  Cardiac health - Under care of Glendia Ferrier, PA, for cardiac health - No history of myocardial infarction      Review of Systems: A complete review of systems was obtained from the patient.  I have reviewed this information and discussed as appropriate with the patient.  See HPI as well for other ROS.  Review of Systems  Constitutional:  Negative for fever.  HENT:  Negative for congestion.   Eyes:  Negative for blurred vision.  Respiratory:  Negative for cough, shortness of breath and wheezing.   Cardiovascular:  Negative for chest pain and palpitations.  Gastrointestinal:  Negative for heartburn.  Genitourinary:  Negative for dysuria.  Musculoskeletal:  Negative for myalgias.  Skin:  Negative for rash.  Neurological:  Negative for dizziness and headaches.  Psychiatric/Behavioral:  Negative for depression and suicidal ideas.   All other systems reviewed and are negative.     Medical History: Past Medical  History: Diagnosis Date  Anemia   Anxiety   Arthritis   GERD (gastroesophageal reflux disease)   History of cancer   Thyroid  disease    There is no problem list on file for this patient.   Past Surgical History: Procedure Laterality Date  Aorta - bilateral femoral artery bypass graf    COLON SURGERY    Ectopic pregnancy surgery    JOINT REPLACEMENT      Allergies Allergen Reactions  Lubiprostone  Diarrhea   Uncontrollable diarrhea  Nortriptyline  Other (See Comments)   Stomach distention  Sulfa (Sulfonamide Antibiotics) Nausea And Vomiting and Other (See Comments)   GI distress/pain  Tramadol  Itching and Rash  Atorvastatin  Nausea And Vomiting  Loratadine Other (See Comments)   Hot flashes   Current Outpatient Medications on File Prior to Visit Medication Sig Dispense Refill  acetaminophen  (TYLENOL ) 500 MG tablet Take 1,000 mg by mouth    acyclovir  (ZOVIRAX ) 5 % ointment Apply 1 Application topically    cholecalciferol (VITAMIN D3) 2,000 unit tablet Take 2,000 Units by mouth once daily    clopidogreL  (PLAVIX ) 75 mg tablet Take 75 mg by mouth once daily    DULoxetine  (CYMBALTA ) 20 MG DR capsule TAKE 2-3 CAPSULES BY MOUTH EVERY DAY    fluticasone  propionate (FLONASE ) 50 mcg/actuation nasal spray Place 1 spray into both nostrils once daily    gabapentin  (NEURONTIN ) 600 MG tablet Take 600 mg by mouth 3 (three) times daily    ketoconazole  (NIZORAL ) 2 % shampoo Apply to hair daily for 7 days -- leave on for 5 minutes, then wash every other day for 7 days, then wash twice  a week for 7 days,  then once a week.    KLOR-CON  M20 20 mEq ER tablet TAKE 1 TABLET BY MOUTH DAILY AS NEEDED (FOR FLUID OR EDEMA).    levothyroxine  (SYNTHROID ) 75 MCG tablet Take 75 mcg by mouth    meclizine  (ANTIVERT ) 25 mg tablet Take 25 mg by mouth 3 (three) times daily as needed    metoclopramide  (REGLAN ) 5 MG tablet Take 5 mg by mouth every 6 (six) hours as needed    metoprolol  SUCCinate (TOPROL -XL) 25  MG XL tablet Take 25 mg by mouth 2 (two) times daily    ondansetron  (ZOFRAN ) 8 MG tablet Take 8 mg by mouth every 8 (eight) hours as needed    pantoprazole  (PROTONIX ) 40 MG DR tablet Take 40 mg by mouth    rOPINIRole  (REQUIP ) 1 MG immediate release tablet Take 1 mg by mouth    rosuvastatin  (CRESTOR ) 40 MG tablet Take 40 mg by mouth once daily    tiZANidine (ZANAFLEX) 4 MG tablet TAKE 1/2 TABLET BY MOUTH THREE TIMES A DAY AS NEEDED FOR PAIN    triamcinolone  0.1 % cream Apply 1 Application topically    amLODIPine  (NORVASC ) 5 MG tablet Take 5 mg by mouth    nitroGLYcerin  (NITROSTAT ) 0.4 MG SL tablet Place 0.4 mg under the tongue every 5 (five) minutes as needed    rimegepant (NURTEC ODT) 75 mg disintegrating tablet Take 75 mg by mouth once daily as needed    topiramate  (TOPAMAX ) 50 MG tablet Take by mouth    No current facility-administered medications on file prior to visit.   Family History Problem Relation Age of Onset  Stroke Mother   Skin cancer Mother   Coronary Artery Disease (Blocked arteries around heart) Mother   Stroke Father     Social History  Tobacco Use Smoking Status Every Day  Current packs/day: 1.00  Types: Cigarettes Smokeless Tobacco Never    Social History  Socioeconomic History  Marital status: Married Tobacco Use  Smoking status: Every Day   Current packs/day: 1.00   Types: Cigarettes  Smokeless tobacco: Never Substance and Sexual Activity  Alcohol use: Never  Drug use: Never  Sexual activity: Defer  Social Drivers of Health  Financial Resource Strain: Low Risk  (02/26/2024)  Received from [facility]  Overall Financial Resource Strain (CARDIA)   Difficulty of Paying Living Expenses: Not hard at all Food Insecurity: No Food Insecurity (02/26/2024)  Received from [facility]  Hunger Vital Sign   Within the past 12 months, you worried that your food would run out before you got the money to buy more.: Never true   Within the past 12 months, the  food you bought just didn't last and you didn't have money to get more.: Never true Transportation Needs: No Transportation Needs (02/26/2024)  Received from [facility] - Transportation   Lack of Transportation (Medical): No   Lack of Transportation (Non-Medical): No Physical Activity: Inactive (02/26/2024)  Received from [facility]  Exercise Vital Sign   On average, how many days per week do you engage in moderate to strenuous exercise (like a brisk walk)?: 0 days   On average, how many minutes do you engage in exercise at this level?: 0 min Stress: No Stress Concern Present (02/26/2024)  Received from [facility] - Occupational Stress Questionnaire   Feeling of Stress : Not at all Social Connections: Socially Integrated (02/26/2024)  Received from [facility]  Social Connection  and Isolation Panel   In a typical week, how many times do you talk on the phone with family, friends, or neighbors?: More than three times a week   How often do you get together with friends or relatives?: More than three times a week   How often do you attend church or religious services?: More than 4 times per year   Do you belong to any clubs or organizations such as church groups, unions, fraternal or athletic groups, or school groups?: Yes   How often do you attend meetings of the clubs or organizations you belong to?: More than 4 times per year   Are you married, widowed, divorced, separated, never married, or living with a partner?: Married Housing Stability: Unknown (06/12/2024)  Housing Stability Vital Sign   Homeless in the Last Year: No   Objective:   Vitals:  09/09/24 1356 PainSc: 0-No pain   There is no height or weight on file to calculate BMI.  Physical Exam Constitutional:      Appearance: Normal appearance.  HENT:     Head: Normocephalic and atraumatic.     Mouth/Throat:     Mouth: Mucous membranes are moist.     Pharynx: Oropharynx is  clear.  Eyes:     General: No scleral icterus.    Pupils: Pupils are equal, round, and reactive to light.  Cardiovascular:     Rate and Rhythm: Normal rate and regular rhythm.     Pulses: Normal pulses.     Heart sounds: No murmur heard.    No friction rub. No gallop.  Pulmonary:     Effort: Pulmonary effort is normal. No respiratory distress.     Breath sounds: Normal breath sounds. No stridor.  Abdominal:     General: Abdomen is flat.     Hernia: A hernia is present.    Musculoskeletal:        General: No swelling.  Skin:    General: Skin is warm.  Neurological:     General: No focal deficit present.     Mental Status: She is alert and oriented to person, place, and time. Mental status is at baseline.  Psychiatric:        Mood and Affect: Mood normal.        Thought Content: Thought content normal.        Judgment: Judgment normal.      Hernia Size:5cm Incarcerated: no Initial Hernia   Assessment and Plan: Diagnoses and all orders for this visit:  Incisional hernia without obstruction or gangrene    Erin [name] is a 68 y.o. female   1.  We will proceed to the OR for a lap ventral hernia repair with mesh. 2. All risks and benefits were discussed with the patient, to generally include infection, bleeding, damage to surrounding structures, acute and chronic nerve pain, and recurrence. Alternatives were offered and described.  All questions were answered and the patient voiced understanding of the procedure and wishes to proceed at this point.       No follow-ups on file.  Lynda Leos, MD, [facility], [place] [facility]

## 2024-09-09 NOTE — Telephone Encounter (Signed)
   Pre-operative Risk Assessment    Patient Name: Erin [name]  DOB: 1956-09-13 MRN: [id number]   Date of last office visit: 07/17/24 GLENDIA FERRIER, [facility] Date of next office visit: NONE   Request for Surgical Clearance    Procedure:  HERNIA SURGERY; PER FORM  Date of Surgery:  Clearance TBD                                Surgeon:  DR. LYNDA LEOS Surgeon's Group or Practice Name:  [facility] Phone number:  [phone] Fax number:  [phone] MICHELLE BROOKS, CMA   Type of Clearance Requested:   - Medical  - Pharmacy:  Hold Aspirin  and Clopidogrel  (Plavix ) x 5 DAYS HOLD PRIOR TO PROCEDURE; THHIS HOLD TIME IS FOR BOTH MEDICATIONS LISTED   Type of Anesthesia:  General    Additional requests/questions:    Bonney Niels Jest   09/09/2024, 4:29 PM

## 2024-09-10 NOTE — Telephone Encounter (Signed)
 Pt just underwent balloon angioplasty of the celiac and superior mesenteric artery on 09/03/24 with Dr. Serene.   Her last PCI was in 2018. She had no unstable cardiac symptoms at her last visit in 06/2024. From a cardiac standpoint, she could hold Clopidogrel . Would continue ASA without interruption if acceptable. If bleeding risk too high, ASA can be held as well.  HOWEVER, holding of ASA and Clopidogrel  needs to be cleared with vascular surgery first. Dr. Serene should be consulted regarding if/when Clopidogrel  and ASA can be held from a PV standpoint.  Glendia Ferrier, PA-C    09/10/2024 4:25 PM

## 2024-09-11 NOTE — Telephone Encounter (Signed)
   Patient Name: Erin [name]  DOB: May 30, 1956 MRN: [id number]  Primary Cardiologist: Ozell Fell, MD  Chart reviewed as part of pre-operative protocol coverage. Given past medical history and time since last visit, based on ACC/AHA guidelines, Erin [name] is at acceptable risk for the planned procedure without further cardiovascular testing.   Pt just underwent balloon angioplasty of the celiac and superior mesenteric artery on 09/03/24 with Dr. Serene.    Her last PCI was in 2018. She had no unstable cardiac symptoms at her last visit in 06/2024. From a cardiac standpoint, she could hold Clopidogrel . Would continue ASA without interruption if acceptable. If bleeding risk too high, ASA can be held as well.   HOWEVER, holding of ASA and Clopidogrel  needs to be cleared with vascular surgery first. Dr. Serene should be consulted regarding if/when Clopidogrel  and ASA can be held from a PV standpoint.   Glendia Ferrier, PA-C    09/10/2024 4:25 PM   I will route this recommendation to the requesting party via Epic fax function and remove from pre-op pool.  Please call with questions.  Lamarr Satterfield, NP 09/11/2024, 8:08 AM

## 2024-09-12 ENCOUNTER — Other Ambulatory Visit: Payer: Self-pay | Admitting: Family Medicine

## 2024-09-12 DIAGNOSIS — G2581 Restless legs syndrome: Secondary | ICD-10-CM

## 2024-09-12 DIAGNOSIS — E7800 Pure hypercholesterolemia, unspecified: Secondary | ICD-10-CM

## 2024-09-12 NOTE — Telephone Encounter (Unsigned)
 Copied from CRM #[id number]. Topic: Clinical - Medication Refill >> Sep 12, 2024 12:23 PM [place] M wrote: Medication: rOPINIRole  (REQUIP ) 1 MG tablet ; rosuvastatin  (CRESTOR ) 40 MG tablet   Has the patient contacted their pharmacy? Yes (Agent: If no, request that the patient contact the pharmacy for the refill. If patient does not wish to contact the pharmacy document the reason why and proceed with request.) (Agent: If yes, when and what did the pharmacy advise?)  This is the patient's preferred pharmacy:  [id number] [place], [place] - [address] [place] [address] Phone: [phone] Fax: [phone]   Is this the correct pharmacy for this prescription? Yes If no, delete pharmacy and type the correct one.   Has the prescription been filled recently? Yes  Is the patient out of the medication? Yes  Has the patient been seen for an appointment in the last year OR does the patient have an upcoming appointment? Yes  Can we respond through MyChart? Yes  Agent: Please be advised that Rx refills may take up to 3 business days. We ask that you follow-up with your pharmacy.

## 2024-09-13 MED ORDER — ROPINIROLE HCL 1 MG PO TABS: 1.0000 mg | ORAL_TABLET | Freq: Every day | ORAL | 1 refills | Status: DC

## 2024-09-13 MED ORDER — ROSUVASTATIN CALCIUM 40 MG PO TABS
40.0000 mg | ORAL_TABLET | Freq: Every day | ORAL | 1 refills | Status: AC
Start: 2024-09-13 — End: ?

## 2024-09-23 ENCOUNTER — Encounter: Payer: Self-pay | Admitting: Family Medicine

## 2024-09-23 ENCOUNTER — Ambulatory Visit: Admitting: Family Medicine

## 2024-09-23 ENCOUNTER — Ambulatory Visit (INDEPENDENT_AMBULATORY_CARE_PROVIDER_SITE_OTHER): Admitting: Family Medicine

## 2024-09-23 VITALS — BP 136/80 | HR 70 | Temp 97.3°F | Ht 63.0 in | Wt 97.9 lb

## 2024-09-23 DIAGNOSIS — H6593 Unspecified nonsuppurative otitis media, bilateral: Secondary | ICD-10-CM | POA: Diagnosis not present

## 2024-09-23 NOTE — Progress Notes (Unsigned)
 Acute Office Visit  Subjective:     Patient ID: CLARRISA KAYLOR, female    DOB: 1956/05/14, 68 y.o.   MRN: [id number]  Chief Complaint  Patient presents with   Ear Pain    Bilateral ear pain and left ear bleeding, quishy sound and feeling, popping x1 month, noticed after using a Q-tip    HPI Discussed the use of AI scribe software for clinical note transcription with the patient, who gave verbal consent to proceed.  History of Present Illness   TIMMIE CALIX is a 68 year old female who presents with bilateral ear pain and hearing issues.  She has experienced bilateral ear pain for two and a half years, beginning after cleaning her ears with Q-tips, which caused bleeding. The pain is aching and throbbing, especially when sleeping on the affected side. Recently, she noticed a 'squishy' sensation and expelled a large chunk of earwax.  She feels pressure in her ears, similar to the sensation during airplane ascent, and describes her hearing as muffled. Her ears pop and hurt when she blows her nose. There is no fever or chills. She experiences sinus congestion and frequent sneezing, attributed to allergies, and uses Flonase  for these symptoms. No facial pain is present, but there is itching and occasional swelling in her ears. She has not used Q-tips recently and has not experienced significant changes in her hearing aside from the muffled sensation.       Review of Systems  All other systems reviewed and are negative.       Objective:    BP 136/80   Pulse 70   Temp (!) 97.3 F (36.3 C) (Oral)   Ht 5' 3 (1.6 m)   Wt 97 lb 14.4 oz (44.4 kg)   SpO2 98%   BMI 17.34 kg/m    Physical Exam Vitals reviewed.  Constitutional:      Appearance: Normal appearance. She is normal weight.  HENT:     Right Ear: Tympanic membrane and ear canal normal. There is no impacted cerumen.     Left Ear: Tympanic membrane and ear canal normal. There is no impacted cerumen.  Pulmonary:      Effort: Pulmonary effort is normal.  Neurological:     Mental Status: She is alert.     No results found for any visits on 09/23/24.      Assessment & Plan:   Problem List Items Addressed This Visit   None Visit Diagnoses       Fluid level behind tympanic membrane of both ears    -  Primary     Assessment and Plan    Bilateral middle ear effusion (nonsuppurative otitis media) Bilateral middle ear effusion likely secondary to sinus congestion. No signs of infection in the eardrums, which appear normal and flesh-colored. Symptoms include muffled hearing, pressure sensation, and occasional drainage. No fever or chills reported. Likely fluid accumulation in the middle ear causing the squishy sensation and pressure. No need for immediate ENT referral unless symptoms persist or worsen. - Recommended using Debrox eardrops to prevent wax impaction. - Advised performing Valsalva maneuver multiple times a day to help drain fluid from the middle ear. - Suggested chewing gum to aid in Eustachian tube function. - Consider adding Zyrtec for a couple of weeks to help with sinus-related fluid buildup. - Continue using Flonase  for sinus issues.        No orders of the defined types were placed in this encounter.   No  follow-ups on file.  Heron [name] Sharper, MD

## 2024-09-26 ENCOUNTER — Ambulatory Visit: Admitting: Neurology

## 2024-10-10 ENCOUNTER — Other Ambulatory Visit

## 2024-10-16 NOTE — Patient Instructions (Addendum)
 SURGICAL WAITING ROOM VISITATION  Patients having surgery or a procedure may have no more than 2 support people in the waiting area - these visitors may rotate.    Children under the age of 56 must have an adult with them who is not the patient.  Visitors with respiratory illnesses are discouraged from visiting and should remain at home.  If the patient needs to stay at the hospital during part of their recovery, the visitor guidelines for inpatient rooms apply. Pre-op nurse will coordinate an appropriate time for 1 support person to accompany patient in pre-op.  This support person may not rotate.    Please refer to the [facility] website for the visitor guidelines for Inpatients (after your surgery is over and you are in a regular room).    Your procedure is scheduled on: 10/23/24   Report to [facility] Main Entrance    Report to admitting at 6:15 AM   Call this number if you have problems the morning of surgery [phone]   Do not eat food :After Midnight.   After Midnight you may have the following liquids until 5:30 AM DAY OF SURGERY  Water  Non-Citrus Juices (without pulp, NO RED-Apple, White grape, White cranberry) Black Coffee (NO MILK/CREAM OR CREAMERS, sugar ok)  Clear Tea (NO MILK/CREAM OR CREAMERS, sugar ok) regular and decaf                             Plain Jell-O (NO RED)                                           Fruit ices (not with fruit pulp, NO RED)                                     Popsicles (NO RED)                                                               Sports drinks like Gatorade (NO RED)          If you have questions, please contact your surgeon's office.   FOLLOW BOWEL PREP AND ANY ADDITIONAL PRE OP INSTRUCTIONS YOU RECEIVED FROM YOUR SURGEON'S OFFICE!!!     Oral Hygiene is also important to reduce your risk of infection.                                    Remember - BRUSH YOUR TEETH THE MORNING OF SURGERY WITH YOUR REGULAR  TOOTHPASTE  DENTURES WILL BE REMOVED PRIOR TO SURGERY PLEASE DO NOT APPLY Poly grip OR ADHESIVES!!!   Do NOT smoke after Midnight   Stop all vitamins and herbal supplements 7 days before surgery.   Take these medicines the morning of surgery with A SIP OF WATER : Tylenol , Amlodipine , Carvedilol , Duloxetine , Gabapentin , Hydralazine , Levothyroxine , Pantoprazole , Rosuvastatin , Topiramate   You may not have any metal on your body including hair pins, jewelry, and body piercing             Do not wear make-up, lotions, powders, perfumes, or deodorant  Do not wear nail polish including gel and S&S, artificial/acrylic nails, or any other type of covering on natural nails including finger and toenails. If you have artificial nails, gel coating, etc. that needs to be removed by a nail salon please have this removed prior to surgery or surgery may need to be canceled/ delayed if the surgeon/ anesthesia feels like they are unable to be safely monitored.   Do not shave  48 hours prior to surgery.    Do not bring valuables to the hospital. [facility] IS NOT             RESPONSIBLE   FOR VALUABLES.   Contacts, glasses, dentures or bridgework may not be worn into surgery.   Bring small overnight bag day of surgery.   DO NOT BRING YOUR HOME MEDICATIONS TO THE HOSPITAL. PHARMACY WILL DISPENSE MEDICATIONS LISTED ON YOUR MEDICATION LIST TO YOU DURING YOUR ADMISSION IN THE HOSPITAL!    Patients discharged on the day of surgery will not be allowed to drive home.  Someone NEEDS to stay with you for the first 24 hours after anesthesia.              Please read over the following fact sheets you were given: IF YOU HAVE QUESTIONS ABOUT YOUR PRE-OP INSTRUCTIONS PLEASE CALL [phone] [place].   If you received a COVID test during your pre-op visit  it is requested that you wear a mask when out in public, stay away from anyone that may not be feeling well and notify your surgeon  if you develop symptoms. If you test positive for Covid or have been in contact with anyone that has tested positive in the last 10 days please notify you surgeon.    [place] - Preparing for Surgery Before surgery, you can play an important role.  Because skin is not sterile, your skin needs to be as free of germs as possible.  You can reduce the number of germs on your skin by washing with CHG (chlorahexidine gluconate) soap before surgery.  CHG is an antiseptic cleaner which kills germs and bonds with the skin to continue killing germs even after washing. Please DO NOT use if you have an allergy to CHG or antibacterial soaps.  If your skin becomes reddened/irritated stop using the CHG and inform your nurse when you arrive at Short Stay. Do not shave (including legs and underarms) for at least 48 hours prior to the first CHG shower.  You may shave your face/neck.  Please follow these instructions carefully:  1.  Shower with CHG Soap the night before surgery ONLY (DO NOT USE THE SOAP THE MORNING OF SURGERY).  2.  If you choose to wash your hair, wash your hair first as usual with your normal  shampoo.  3.  After you shampoo, rinse your hair and body thoroughly to remove the shampoo.                             4.  Use CHG as you would any other liquid soap.  You can apply chg directly to the skin and wash.  Gently with a scrungie or clean washcloth.  5.  Apply the CHG Soap to your body  ONLY FROM THE NECK DOWN.   Do   not use on face/ open                           Wound or open sores. Avoid contact with eyes, ears mouth and   genitals (private parts).                       Wash face,  Genitals (private parts) with your normal soap.             6.  Wash thoroughly, paying special attention to the area where your    surgery  will be performed.  7.  Thoroughly rinse your body with warm water  from the neck down.  8.  DO NOT shower/wash with your normal soap after using and rinsing off the CHG  Soap.                9.  Pat yourself dry with a clean towel.            10.  Wear clean pajamas.            11.  Place clean sheets on your bed the night of your first shower and do not  sleep with pets. Day of Surgery : Do not apply any CHG, lotions/deodorants the morning of surgery.  Please wear clean clothes to the hospital/surgery center.  FAILURE TO FOLLOW THESE INSTRUCTIONS MAY RESULT IN THE CANCELLATION OF YOUR SURGERY  PATIENT SIGNATURE_________________________________  NURSE SIGNATURE__________________________________  ________________________________________________________________________

## 2024-10-16 NOTE — Progress Notes (Addendum)
 Date of COVID positive in last 90 days:  PCP - Barrett Sharper, MD Cardiologist - Glendia Ferrier Neurologist- Juliene Dunnings ,DO  Cardiac clearance by Glendia Ferrier, PA dated 09/09/24 in Epic  Chest x-ray - N/A EKG - 06/19/24 Epic Stress Test - 08/09/17 Epic ECHO - 10/23/17 Epic Cardiac Cath - 10/13/17 Epic Pacemaker/ICD device last checked:N/A Spinal Cord Stimulator:N/A  Bowel Prep - N/A  Sleep Study - N/A CPAP -   Fasting Blood Sugar - borderline, diet controlled, no meds or checks at home Checks Blood Sugar _____ times a day  Last dose of GLP1 agonist-  N/A GLP1 instructions:  Do not take after     Last dose of SGLT-2 inhibitors-  N/A SGLT-2 instructions:  Do not take after     Blood Thinner Instructions: Plavix , hold 3 days Aspirin  Instructions: ASA 81 Last Dose: 10/16/24 2100   Activity level: Can perform activities of daily living without stopping and without symptoms of chest pain or shortness of breath. No stairs and difficulty with long distances due to arthritis and deterioration.  Anesthesia review: CAD, PVD, HF, HTN, cardiomyopathy, stents  Patient denies shortness of breath, fever, cough and chest pain at PAT appointment  Patient verbalized understanding of instructions that were given to them at the PAT appointment. Patient was also instructed that they will need to review over the PAT instructions again at home before surgery.

## 2024-10-18 ENCOUNTER — Encounter (HOSPITAL_COMMUNITY): Payer: Self-pay

## 2024-10-18 ENCOUNTER — Other Ambulatory Visit: Payer: Self-pay

## 2024-10-18 ENCOUNTER — Encounter (HOSPITAL_COMMUNITY)
Admission: RE | Admit: 2024-10-18 | Discharge: 2024-10-18 | Disposition: A | Discharge status: Home / Self Care | Source: Ambulatory Visit | Attending: General Surgery | Admitting: General Surgery

## 2024-10-18 VITALS — BP 172/77 | HR 67 | Temp 97.9°F | Resp 16 | Ht 63.0 in | Wt 97.9 lb

## 2024-10-18 DIAGNOSIS — F32A Depression, unspecified: Secondary | ICD-10-CM | POA: Diagnosis not present

## 2024-10-18 DIAGNOSIS — I130 Hypertensive heart and chronic kidney disease with heart failure and stage 1 through stage 4 chronic kidney disease, or unspecified chronic kidney disease: Secondary | ICD-10-CM | POA: Diagnosis not present

## 2024-10-18 DIAGNOSIS — Z01818 Encounter for other preprocedural examination: Secondary | ICD-10-CM | POA: Diagnosis present

## 2024-10-18 DIAGNOSIS — Z923 Personal history of irradiation: Secondary | ICD-10-CM | POA: Insufficient documentation

## 2024-10-18 DIAGNOSIS — K219 Gastro-esophageal reflux disease without esophagitis: Secondary | ICD-10-CM | POA: Diagnosis not present

## 2024-10-18 DIAGNOSIS — Z01812 Encounter for preprocedural laboratory examination: Secondary | ICD-10-CM | POA: Insufficient documentation

## 2024-10-18 DIAGNOSIS — N189 Chronic kidney disease, unspecified: Secondary | ICD-10-CM | POA: Insufficient documentation

## 2024-10-18 DIAGNOSIS — K432 Incisional hernia without obstruction or gangrene: Secondary | ICD-10-CM | POA: Diagnosis not present

## 2024-10-18 DIAGNOSIS — Z85048 Personal history of other malignant neoplasm of rectum, rectosigmoid junction, and anus: Secondary | ICD-10-CM | POA: Diagnosis not present

## 2024-10-18 DIAGNOSIS — Z7902 Long term (current) use of antithrombotics/antiplatelets: Secondary | ICD-10-CM | POA: Insufficient documentation

## 2024-10-18 DIAGNOSIS — E119 Type 2 diabetes mellitus without complications: Secondary | ICD-10-CM

## 2024-10-18 DIAGNOSIS — I509 Heart failure, unspecified: Secondary | ICD-10-CM | POA: Insufficient documentation

## 2024-10-18 DIAGNOSIS — F419 Anxiety disorder, unspecified: Secondary | ICD-10-CM | POA: Insufficient documentation

## 2024-10-18 DIAGNOSIS — E039 Hypothyroidism, unspecified: Secondary | ICD-10-CM | POA: Insufficient documentation

## 2024-10-18 DIAGNOSIS — Z955 Presence of coronary angioplasty implant and graft: Secondary | ICD-10-CM | POA: Diagnosis not present

## 2024-10-18 DIAGNOSIS — I2510 Atherosclerotic heart disease of native coronary artery without angina pectoris: Secondary | ICD-10-CM | POA: Diagnosis not present

## 2024-10-18 DIAGNOSIS — I739 Peripheral vascular disease, unspecified: Secondary | ICD-10-CM | POA: Insufficient documentation

## 2024-10-18 DIAGNOSIS — E1122 Type 2 diabetes mellitus with diabetic chronic kidney disease: Secondary | ICD-10-CM | POA: Diagnosis not present

## 2024-10-18 DIAGNOSIS — Z95820 Peripheral vascular angioplasty status with implants and grafts: Secondary | ICD-10-CM | POA: Insufficient documentation

## 2024-10-18 DIAGNOSIS — F17210 Nicotine dependence, cigarettes, uncomplicated: Secondary | ICD-10-CM | POA: Diagnosis not present

## 2024-10-18 LAB — BASIC METABOLIC PANEL WITH GFR
Anion gap: 12 (ref 5–15)
BUN: 25 mg/dL — ABNORMAL HIGH (ref 8–23)
CO2: 22 mmol/L (ref 22–32)
Calcium: 9 mg/dL (ref 8.9–10.3)
Chloride: 103 mmol/L (ref 98–111)
Creatinine, Ser: 1.47 mg/dL — ABNORMAL HIGH (ref 0.44–1.00)
GFR, Estimated: 38 mL/min — ABNORMAL LOW (ref >60)
Glucose, Bld: 90 mg/dL (ref 70–99)
Potassium: 3.6 mmol/L (ref 3.5–5.1)
Sodium: 137 mmol/L (ref 135–145)

## 2024-10-18 LAB — CBC
HCT: 32.8 % — ABNORMAL LOW (ref 36.0–46.0)
Hemoglobin: 10.5 g/dL — ABNORMAL LOW (ref 12.0–15.0)
MCH: 30.6 pg (ref 26.0–34.0)
MCHC: 32 g/dL (ref 30.0–36.0)
MCV: 95.6 fL (ref 80.0–100.0)
Platelets: 295 K/uL (ref 150–400)
RBC: 3.43 MIL/uL — ABNORMAL LOW (ref 3.87–5.11)
RDW: 12.9 % (ref 11.5–15.5)
WBC: 8.1 K/uL (ref 4.0–10.5)
nRBC: 0 % (ref 0.0–0.2)

## 2024-10-19 LAB — HEMOGLOBIN A1C
Hgb A1c MFr Bld: 4.9 % (ref 4.8–5.6)
Mean Plasma Glucose: 94 mg/dL

## 2024-10-21 NOTE — Progress Notes (Signed)
 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  Patient follows with cardiology for history of CAD s/p DES to the RCA in 2018.  Currently on medical therapy.  Last seen in clinic on 07/17/2024.  Blood pressure noted to be uncontrolled and she is being evaluated by vascular for her renal artery stenosis. Pt did not have cardiac symptoms. Advised f/u in 6 month. Cardiac clearance given in 09/11/24 telephone note:  Chart reviewed as part of pre-operative protocol coverage. Given past medical history and time since last visit, based on ACC/AHA guidelines, Erin [name] is at acceptable risk for the planned procedure without further cardiovascular testing.  Pt just underwent balloon angioplasty of the celiac and superior mesenteric artery on 09/03/24 with Dr. Serene.  Her last PCI was in 2018. She had no unstable cardiac symptoms at her last visit in 06/2024. From a cardiac standpoint, she could hold Clopidogrel . Would continue ASA without interruption if acceptable. If bleeding risk too high, ASA can be held as well. HOWEVER, holding of ASA and Clopidogrel  needs to be cleared with vascular surgery first. Dr. Serene should be consulted regarding if/when Clopidogrel  and ASA can be held from a PV  standpoint.  Patient follows with Vascular for extensive PVD history with multiple prior interventions. She underwent aortogram on 09/03/24 with Dr. Serene due to in stent stenosis and had drug-coated balloon angioplasty of the celiac artery and balloon angioplasty of the SMA.   LD Plavix : 11/26  VS: BP (!) 172/77   Pulse 67   Temp 36.6 C (Oral)   Resp 16   Ht 5' 3 (1.6 m)   Wt 44.4 kg   SpO2 99%   BMI 17.34 kg/m   PROVIDERS: [name] [name], MD   LABS: Labs reviewed: Acceptable for surgery. (all labs ordered are listed, but only abnormal results are displayed)  Labs Reviewed  BASIC METABOLIC PANEL WITH GFR - Abnormal; Notable for the following components:      Result Value   BUN 25 (*)    Creatinine, Ser 1.47 (*)    GFR, Estimated 38 (*)    All other components within normal limits  CBC - Abnormal; Notable for the following components:   RBC 3.43 (*)    Hemoglobin 10.5 (*)    HCT 32.8 (*)    All other components within normal limits  HEMOGLOBIN A1C      Aortogram 09/03/24:    Impression:            #1  In-stent stenosis in the celiac and superior mesenteric artery stent, greater than 60 to 70%.  The celiac was treated with a 5 x 40 Ranger balloon followed by a 7 x 20 Sterling balloon.  The SMA was treated with a 7 x 20 balloon.  There was no residual stenosis            #  2  Patent bilateral femoral-popliteal bypass grafts    VAS US  Renal 07/02/24:  Summary: Largest Aortic Diameter: 1.9 cm   Renal:   Right: 1-59% stenosis of the right renal artery. RRV flow present.        Normal size right kidney. Normal right Resisitive Index.        Normal cortical thickness of right kidney. Left:  Evidence of a > 60% stenosis in the left renal artery. LRV        flow present. Normal size of left kidney. Normal left        Resistive Index. Abnormal cortical thickness of the left        kidney. Mesenteric: Normal Superior Mesenteric artery findings. 70 to 99%  stenosis in the Celiac artery.  Echo 01/24/2018:  ------------------------------------------- Study Conclusions  - Left ventricle: The cavity size was normal. Systolic function was   normal. The estimated ejection fraction was in the range of 55%   to 60%. Wall motion was normal; there were no regional wall   motion abnormalities. Left ventricular diastolic function   parameters were normal. - Aortic valve: Cannot determined whether functionally bicuspid or   tricuspid aortic valve. The valve leaflets are moderate calcified   and there appears to be some restriction. No obvious stenosis by   Doppler. - Mitral valve: There was trivial regurgitation. - Tricuspid valve: There was trivial regurgitation.   LHC 10/13/2017:  Conclusions: Severe single vessel coronary artery disease with diffusely diseased RCA with eccentric aneurysm followed by tubular 99% stenosis in the mid vessel. Low normal left ventricular filling pressure. Mid anterior wall akinesis with otherwise preserved left ventricular contraction. LVEF 40-45%. Successful PCI to mid RCA with placement of Resolute Onyx 2.0 x 12 mm drug-eluting stent (post-dilated to 2.3 mm) with 0% residual stenosis and TIMI-3 flow.   Recommendations: Overnight observation; transfer to [facility]. Dual antiplatelet therapy with aspirin  and ticagrelor  for at least 12 months. Aggressive secondary prevention. Remove left brachial artery sheath with manual compression once ACT has fallen below 170 seconds.  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/14 60.4Gy/67fx and chemo   Anxiety    Aortoiliac occlusive disease (HCC) 08/14/2017   Arthritis    B12 deficiency anemia 09/14/2015   Blood transfusion without reported diagnosis    BPPV (benign paroxysmal positional vertigo) 07/08/2015   Cardiomyopathy (HCC) 11/03/2017   Chronic back pain     Chronic daily headache 03/29/2013   takes bc powder   Closed right hip fracture (HCC) 09/10/2015   Coronary artery disease involving native coronary artery of native heart without angina pectoris 10/13/2017   DES to mid RCA   Depression    Essential (hemorrhagic) thrombocythemia (HCC) 09/06/2018   Essential hypertension 09/25/2023   Family history of early CAD 04/08/2016   GERD (gastroesophageal reflux disease)    History of hiatal hernia    Hot flashes    Hyperlipidemia    Hyperlipidemia associated with type 2 diabetes mellitus (HCC) 05/11/2017   Hypothyroidism 09/10/2015   IBS (irritable bowel syndrome) 03/29/2013   Neck pain 07/08/2015   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   takes neurotin   Peripheral vascular disease 11/03/2017   QT prolongation    Sacroiliitis 09/06/2018   Spasms of the hands or feet 10/08/2017   Syncope 06/07/2016   Tubular adenoma of colon 09/08/2003   Vertigo    Wears glasses  Past Surgical History:  Procedure Laterality Date   ABDOMINAL AORTOGRAM N/A 08/02/2017   Procedure: ABDOMINAL AORTOGRAM;  Surgeon: Darron Deatrice LABOR, MD;  [facility];  Service: Cardiovascular;  Laterality: N/A;   ABDOMINAL AORTOGRAM W/LOWER EXTREMITY Bilateral 09/03/2024   Procedure: ABDOMINAL AORTOGRAM W/LOWER EXTREMITY;  Surgeon: Serene Gaile ORN, MD;  [facility]: [facility]AS[facility] LAB;  Service: Cardiovascular;  Laterality: Bilateral;   AORTA - BILATERAL FEMORAL ARTERY BYPASS GRAFT N/A 08/14/2017   Procedure: AORTA BIFEMORAL BYPASS GRAFT;  Surgeon: Oris Krystal FALCON, MD;  Location: [facility] OR;  Service: Vascular;  Laterality: N/A;   COLONOSCOPY     COLONOSCOPY N/A 08/29/2024   Procedure: COLONOSCOPY;  Surgeon: Wilhelmenia Aloha Raddle., MD;  [facility];  Service: Gastroenterology;  Laterality: N/A;   CORONARY STENT INTERVENTION N/A 10/13/2017   Procedure: CORONARY STENT INTERVENTION;  Surgeon: Mady Bruckner, MD;  [facility];  Service:  Cardiovascular;  Laterality: N/A;   dental implant     ECTOPIC PREGNANCY SURGERY     EMBOLECTOMY N/A 08/14/2017   Procedure: EMBOLECTOMY FEMORAL;  Surgeon: Oris Krystal FALCON, MD;  Location: [facility] OR;  Service: Vascular;  Laterality: N/A;   FEMORAL-POPLITEAL BYPASS GRAFT Right 08/14/2017   Procedure: Right Femoral to Above Knee Popliteal Bypass Graft using Non-Reversed Greater Saphenous Vein Graft from Right Leg;  Surgeon: Oris Krystal FALCON, MD;  Location: [facility] OR;  Service: Vascular;  Laterality: Right;   FLEXIBLE SIGMOIDOSCOPY N/A 08/14/2013   Procedure: FLEXIBLE SIGMOIDOSCOPY;  Surgeon: Gwendlyn ONEIDA Buddy, MD;  [facility];  Service: Endoscopy;  Laterality: N/A;   LEFT HEART CATH AND CORONARY ANGIOGRAPHY N/A 10/13/2017   Procedure: LEFT HEART CATH AND CORONARY ANGIOGRAPHY;  Surgeon: Mady Bruckner, MD;  [facility];  Service: Cardiovascular;  Laterality: N/A;   LOWER EXTREMITY ANGIOGRAPHY Bilateral 08/02/2017   Procedure: Lower Extremity Angiography;  Surgeon: Darron Deatrice LABOR, MD;  Location: [facility] INVASIVE CV LAB;  Service: Cardiovascular;  Laterality: Bilateral;   LOWER EXTREMITY INTERVENTION N/A 09/03/2024   Procedure: LOWER EXTREMITY INTERVENTION;  Surgeon: Serene Gaile ORN, MD;  [facility];  Service: Cardiovascular;  Laterality: N/A;   MULTIPLE TOOTH EXTRACTIONS     PERIPHERAL VASCULAR INTERVENTION  05/22/2018   Procedure: PERIPHERAL VASCULAR INTERVENTION;  Surgeon: Serene Gaile ORN, MD;  [facility];  Service: Cardiovascular;;  SMA and Celiac   PILONIDAL CYST EXCISION     POLYPECTOMY     THROMBECTOMY FEMORAL ARTERY Right 08/14/2017   Procedure: THROMBECTOMY FEMORAL ARTERY;  Surgeon: Oris Krystal FALCON, MD;  Location: [facility] OR;  Service: Vascular;  Laterality: Right;   TONSILLECTOMY     TOTAL HIP ARTHROPLASTY  09/11/2015   Procedure: TOTAL HIP ARTHROPLASTY;  Surgeon: Evalene JONETTA Chancy, MD;  [facility];  Service: Orthopedics;;   ULTRASOUND GUIDANCE  FOR VASCULAR ACCESS  10/13/2017   Procedure: Ultrasound Guidance For Vascular Access;  Surgeon: Mady Bruckner, MD;  [facility];  Service: Cardiovascular;;   VISCERAL ANGIOGRAPHY N/A 03/03/2020   Procedure: MESTENRIC ANGIOGRAPHY;  Surgeon: Serene Gaile ORN, MD;  [facility];  Service: Cardiovascular;  Laterality: N/A;   VISCERAL ANGIOGRAPHY N/A 09/03/2024   Procedure: VISCERAL ANGIOGRAPHY;  Surgeon: Serene Gaile ORN, MD;  [facility];  Service: Cardiovascular;  Laterality: N/A;   VISCERAL ARTERY INTERVENTION N/A 09/03/2024   Procedure: VISCERAL ARTERY INTERVENTION;  Surgeon: Serene Gaile ORN, MD;  [facility];  Service: Cardiovascular;  Laterality: N/A;    MEDICATIONS:  acetaminophen  (TYLENOL ) 500 MG tablet   acyclovir  ointment (ZOVIRAX ) 5 %   amLODipine  (NORVASC ) 5 MG tablet   aspirin  EC 81 MG tablet   carvedilol  (COREG ) 12.5 MG tablet   Cholecalciferol (VITAMIN D ) 2000 units tablet   clopidogrel  (PLAVIX ) 75 MG tablet   DULoxetine  (CYMBALTA ) 20 MG capsule   fluticasone  (FLONASE ) 50 MCG/ACT nasal spray   gabapentin  (NEURONTIN ) 600 MG tablet   hydrALAZINE  (APRESOLINE ) 25 MG tablet   ketoconazole  (NIZORAL ) 2 % shampoo   levothyroxine  (SYNTHROID ) 75 MCG tablet   meclizine  (ANTIVERT ) 25 MG tablet   metoCLOPramide  (REGLAN ) 5 MG tablet   nitroGLYCERIN  (NITROSTAT ) 0.4 MG SL tablet   nystatin -triamcinolone  (MYCOLOG II) cream   pantoprazole  (PROTONIX ) 40 MG tablet   potassium chloride  SA (KLOR-CON  M20) 20 MEQ tablet   rOPINIRole  (REQUIP ) 1 MG tablet   rosuvastatin  (CRESTOR ) 40 MG tablet   tiZANidine (ZANAFLEX) 4 MG tablet   topiramate  (TOPAMAX ) 50 MG tablet   vitamin B-12 1000 MCG tablet   No current facility-administered medications for this encounter.   Burnard [name] Odis DEVONNA [facility] Short Stay/Anesthesiology [facility] Phone [phone] 10/21/2024 11:14 AM

## 2024-10-21 NOTE — Anesthesia Preprocedure Evaluation (Signed)
 Anesthesia Evaluation  Patient identified by MRN, date of birth, ID band Patient awake    Reviewed: Allergy & Precautions, NPO status , Patient's Chart, lab work & pertinent test results  History of Anesthesia Complications Negative for: history of anesthetic complications  Airway Mallampati: I       Dental  (+) Teeth Intact, Dental Advisory Given, Lower Dentures   Pulmonary Patient abstained from smoking., former smoker   breath sounds clear to auscultation       Cardiovascular hypertension, + CAD, + Cardiac Stents and + Peripheral Vascular Disease  + dysrhythmias  Rhythm:Regular Rate:Normal     Neuro/Psych  Headaches  Anxiety Depression       GI/Hepatic ,GERD  ,,  Endo/Other  diabetesHypothyroidism    Renal/GU Renal hypertensionRenal diseaseRenal Artery Stenosis     Musculoskeletal  (+) Arthritis ,    Abdominal   Peds  Hematology   Anesthesia Other Findings   Reproductive/Obstetrics                              Anesthesia Physical Anesthesia Plan  ASA: 3  Anesthesia Plan: General   Post-op Pain Management: Tylenol  PO (pre-op)*   Induction: Intravenous  PONV Risk Score and Plan: 3 and Ondansetron , Dexamethasone , Midazolam  and Treatment may vary due to age or medical condition  Airway Management Planned: Oral ETT  Additional Equipment:   Intra-op Plan:   Post-operative Plan: Extubation in OR  Informed Consent:      Dental advisory given  Plan Discussed with: CRNA and Surgeon  Anesthesia Plan Comments: (See PAT note from 11/28)         Anesthesia Quick Evaluation

## 2024-10-22 NOTE — Progress Notes (Addendum)
 [facility]  PROCEDURAL EXPEDITER PROGRESS NOTE  Patient Name: Erin [name]  DOB:September 05, 1956 Date of Admission: (Not on file)  Date of Assessment:10/22/24   -------------------------------------------------------------------------------------------------------------------   Brief clinical summary: 68 yr old female wotj Hx of HTN and DM and stent place in the celiac and and superior mesenteric artery .  Pt having surgery lapventral hernia on12/01/2024  Orders in place: yes Communication with surgical team if no orders: n/a  Labs, test, and orders reviewed: yes  Requires surgical clearance:  yes   What type of clearance: Cardiac   Clearance received: yes 09/09/2024  Barriers noted:n/a   Intervention provided by [facility] team: n/a  Barrier resolved:  not applicable   -------------------------------------------------------------------------------------------------------------------  [facility] Expediter, Ronal DELENA Bald Please contact us  directly via secure chat (search for [facility]) or by calling us  at [phone] [facility]).

## 2024-10-23 ENCOUNTER — Encounter (HOSPITAL_COMMUNITY): Admitting: Medical

## 2024-10-23 ENCOUNTER — Inpatient Hospital Stay (HOSPITAL_COMMUNITY)
Admission: AD | Admit: 2024-10-23 | Source: Home / Self Care | Attending: Internal Medicine | Admitting: Internal Medicine

## 2024-10-23 ENCOUNTER — Encounter (HOSPITAL_COMMUNITY)
Admission: AD | Disposition: A | Discharge status: Rehabilitation Facility | Payer: Self-pay | Source: Home / Self Care | Attending: Internal Medicine

## 2024-10-23 ENCOUNTER — Encounter (HOSPITAL_COMMUNITY): Payer: Self-pay | Admitting: General Surgery

## 2024-10-23 ENCOUNTER — Inpatient Hospital Stay (HOSPITAL_COMMUNITY): Admitting: Anesthesiology

## 2024-10-23 ENCOUNTER — Inpatient Hospital Stay (HOSPITAL_COMMUNITY)

## 2024-10-23 DIAGNOSIS — D65 Disseminated intravascular coagulation [defibrination syndrome]: Secondary | ICD-10-CM | POA: Diagnosis not present

## 2024-10-23 DIAGNOSIS — T8119XA Other postprocedural shock, initial encounter: Secondary | ICD-10-CM | POA: Diagnosis not present

## 2024-10-23 DIAGNOSIS — N319 Neuromuscular dysfunction of bladder, unspecified: Secondary | ICD-10-CM | POA: Diagnosis present

## 2024-10-23 DIAGNOSIS — K432 Incisional hernia without obstruction or gangrene: Principal | ICD-10-CM | POA: Diagnosis present

## 2024-10-23 DIAGNOSIS — R4701 Aphasia: Secondary | ICD-10-CM | POA: Diagnosis not present

## 2024-10-23 DIAGNOSIS — Z23 Encounter for immunization: Secondary | ICD-10-CM

## 2024-10-23 DIAGNOSIS — R58 Hemorrhage, not elsewhere classified: Secondary | ICD-10-CM | POA: Diagnosis not present

## 2024-10-23 DIAGNOSIS — G9341 Metabolic encephalopathy: Secondary | ICD-10-CM | POA: Diagnosis present

## 2024-10-23 DIAGNOSIS — I255 Ischemic cardiomyopathy: Secondary | ICD-10-CM | POA: Diagnosis present

## 2024-10-23 DIAGNOSIS — J189 Pneumonia, unspecified organism: Secondary | ICD-10-CM | POA: Diagnosis not present

## 2024-10-23 DIAGNOSIS — E8721 Acute metabolic acidosis: Secondary | ICD-10-CM | POA: Diagnosis not present

## 2024-10-23 DIAGNOSIS — E1169 Type 2 diabetes mellitus with other specified complication: Secondary | ICD-10-CM | POA: Diagnosis present

## 2024-10-23 DIAGNOSIS — D631 Anemia in chronic kidney disease: Secondary | ICD-10-CM | POA: Diagnosis present

## 2024-10-23 DIAGNOSIS — R579 Shock, unspecified: Secondary | ICD-10-CM | POA: Diagnosis present

## 2024-10-23 DIAGNOSIS — I639 Cerebral infarction, unspecified: Secondary | ICD-10-CM | POA: Diagnosis not present

## 2024-10-23 DIAGNOSIS — D696 Thrombocytopenia, unspecified: Secondary | ICD-10-CM | POA: Diagnosis not present

## 2024-10-23 DIAGNOSIS — Z87448 Personal history of other diseases of urinary system: Secondary | ICD-10-CM | POA: Diagnosis not present

## 2024-10-23 DIAGNOSIS — Z8249 Family history of ischemic heart disease and other diseases of the circulatory system: Secondary | ICD-10-CM

## 2024-10-23 DIAGNOSIS — R571 Hypovolemic shock: Secondary | ICD-10-CM | POA: Diagnosis not present

## 2024-10-23 DIAGNOSIS — I2510 Atherosclerotic heart disease of native coronary artery without angina pectoris: Secondary | ICD-10-CM | POA: Diagnosis not present

## 2024-10-23 DIAGNOSIS — E870 Hyperosmolality and hypernatremia: Secondary | ICD-10-CM | POA: Diagnosis present

## 2024-10-23 DIAGNOSIS — Z888 Allergy status to other drugs, medicaments and biological substances status: Secondary | ICD-10-CM

## 2024-10-23 DIAGNOSIS — Z8719 Personal history of other diseases of the digestive system: Secondary | ICD-10-CM | POA: Diagnosis not present

## 2024-10-23 DIAGNOSIS — R5381 Other malaise: Secondary | ICD-10-CM | POA: Diagnosis not present

## 2024-10-23 DIAGNOSIS — J9690 Respiratory failure, unspecified, unspecified whether with hypoxia or hypercapnia: Secondary | ICD-10-CM | POA: Diagnosis not present

## 2024-10-23 DIAGNOSIS — K661 Hemoperitoneum: Secondary | ICD-10-CM | POA: Diagnosis not present

## 2024-10-23 DIAGNOSIS — K5901 Slow transit constipation: Secondary | ICD-10-CM | POA: Diagnosis not present

## 2024-10-23 DIAGNOSIS — Z881 Allergy status to other antibiotic agents status: Secondary | ICD-10-CM

## 2024-10-23 DIAGNOSIS — E119 Type 2 diabetes mellitus without complications: Secondary | ICD-10-CM

## 2024-10-23 DIAGNOSIS — Z434 Encounter for attention to other artificial openings of digestive tract: Secondary | ICD-10-CM | POA: Diagnosis not present

## 2024-10-23 DIAGNOSIS — Z9889 Other specified postprocedural states: Secondary | ICD-10-CM | POA: Diagnosis not present

## 2024-10-23 DIAGNOSIS — F32A Depression, unspecified: Secondary | ICD-10-CM | POA: Diagnosis present

## 2024-10-23 DIAGNOSIS — N170 Acute kidney failure with tubular necrosis: Secondary | ICD-10-CM | POA: Diagnosis not present

## 2024-10-23 DIAGNOSIS — I69391 Dysphagia following cerebral infarction: Secondary | ICD-10-CM | POA: Diagnosis not present

## 2024-10-23 DIAGNOSIS — Z7982 Long term (current) use of aspirin: Secondary | ICD-10-CM

## 2024-10-23 DIAGNOSIS — G9389 Other specified disorders of brain: Secondary | ICD-10-CM | POA: Diagnosis not present

## 2024-10-23 DIAGNOSIS — R29730 NIHSS score 30: Secondary | ICD-10-CM | POA: Diagnosis not present

## 2024-10-23 DIAGNOSIS — Z83438 Family history of other disorder of lipoprotein metabolism and other lipidemia: Secondary | ICD-10-CM

## 2024-10-23 DIAGNOSIS — R569 Unspecified convulsions: Secondary | ICD-10-CM | POA: Diagnosis not present

## 2024-10-23 DIAGNOSIS — E1151 Type 2 diabetes mellitus with diabetic peripheral angiopathy without gangrene: Secondary | ICD-10-CM | POA: Diagnosis present

## 2024-10-23 DIAGNOSIS — I10 Essential (primary) hypertension: Secondary | ICD-10-CM | POA: Diagnosis not present

## 2024-10-23 DIAGNOSIS — Y95 Nosocomial condition: Secondary | ICD-10-CM | POA: Diagnosis not present

## 2024-10-23 DIAGNOSIS — D649 Anemia, unspecified: Secondary | ICD-10-CM | POA: Diagnosis not present

## 2024-10-23 DIAGNOSIS — R1312 Dysphagia, oropharyngeal phase: Secondary | ICD-10-CM | POA: Diagnosis not present

## 2024-10-23 DIAGNOSIS — I63443 Cerebral infarction due to embolism of bilateral cerebellar arteries: Secondary | ICD-10-CM | POA: Diagnosis not present

## 2024-10-23 DIAGNOSIS — I129 Hypertensive chronic kidney disease with stage 1 through stage 4 chronic kidney disease, or unspecified chronic kidney disease: Secondary | ICD-10-CM | POA: Diagnosis present

## 2024-10-23 DIAGNOSIS — E039 Hypothyroidism, unspecified: Secondary | ICD-10-CM | POA: Diagnosis present

## 2024-10-23 DIAGNOSIS — Z91040 Latex allergy status: Secondary | ICD-10-CM

## 2024-10-23 DIAGNOSIS — Z681 Body mass index (BMI) 19 or less, adult: Secondary | ICD-10-CM

## 2024-10-23 DIAGNOSIS — Z885 Allergy status to narcotic agent status: Secondary | ICD-10-CM

## 2024-10-23 DIAGNOSIS — Z85048 Personal history of other malignant neoplasm of rectum, rectosigmoid junction, and anus: Secondary | ICD-10-CM

## 2024-10-23 DIAGNOSIS — E7849 Other hyperlipidemia: Secondary | ICD-10-CM | POA: Diagnosis present

## 2024-10-23 DIAGNOSIS — K580 Irritable bowel syndrome with diarrhea: Secondary | ICD-10-CM | POA: Diagnosis not present

## 2024-10-23 DIAGNOSIS — Z87891 Personal history of nicotine dependence: Secondary | ICD-10-CM | POA: Diagnosis not present

## 2024-10-23 DIAGNOSIS — J1569 Pneumonia due to other gram-negative bacteria: Secondary | ICD-10-CM | POA: Diagnosis present

## 2024-10-23 DIAGNOSIS — R29810 Facial weakness: Secondary | ICD-10-CM | POA: Diagnosis not present

## 2024-10-23 DIAGNOSIS — D62 Acute posthemorrhagic anemia: Secondary | ICD-10-CM | POA: Diagnosis not present

## 2024-10-23 DIAGNOSIS — R609 Edema, unspecified: Secondary | ICD-10-CM | POA: Diagnosis not present

## 2024-10-23 DIAGNOSIS — I63543 Cerebral infarction due to unspecified occlusion or stenosis of bilateral cerebellar arteries: Secondary | ICD-10-CM | POA: Diagnosis not present

## 2024-10-23 DIAGNOSIS — Z923 Personal history of irradiation: Secondary | ICD-10-CM

## 2024-10-23 DIAGNOSIS — R3915 Urgency of urination: Secondary | ICD-10-CM | POA: Diagnosis not present

## 2024-10-23 DIAGNOSIS — G9340 Encephalopathy, unspecified: Secondary | ICD-10-CM | POA: Diagnosis not present

## 2024-10-23 DIAGNOSIS — I6389 Other cerebral infarction: Secondary | ICD-10-CM | POA: Diagnosis not present

## 2024-10-23 DIAGNOSIS — J9601 Acute respiratory failure with hypoxia: Secondary | ICD-10-CM | POA: Diagnosis not present

## 2024-10-23 DIAGNOSIS — E440 Moderate protein-calorie malnutrition: Secondary | ICD-10-CM | POA: Diagnosis not present

## 2024-10-23 DIAGNOSIS — Z7902 Long term (current) use of antithrombotics/antiplatelets: Secondary | ICD-10-CM | POA: Diagnosis not present

## 2024-10-23 DIAGNOSIS — E872 Acidosis, unspecified: Secondary | ICD-10-CM | POA: Diagnosis not present

## 2024-10-23 DIAGNOSIS — I69351 Hemiplegia and hemiparesis following cerebral infarction affecting right dominant side: Secondary | ICD-10-CM | POA: Diagnosis not present

## 2024-10-23 DIAGNOSIS — N1832 Chronic kidney disease, stage 3b: Secondary | ICD-10-CM | POA: Diagnosis present

## 2024-10-23 DIAGNOSIS — Z79899 Other long term (current) drug therapy: Secondary | ICD-10-CM

## 2024-10-23 DIAGNOSIS — Z9221 Personal history of antineoplastic chemotherapy: Secondary | ICD-10-CM

## 2024-10-23 DIAGNOSIS — E1122 Type 2 diabetes mellitus with diabetic chronic kidney disease: Secondary | ICD-10-CM | POA: Diagnosis present

## 2024-10-23 DIAGNOSIS — I6340 Cerebral infarction due to embolism of unspecified cerebral artery: Secondary | ICD-10-CM | POA: Diagnosis not present

## 2024-10-23 DIAGNOSIS — S3011XA Contusion of abdominal wall, initial encounter: Secondary | ICD-10-CM | POA: Diagnosis not present

## 2024-10-23 DIAGNOSIS — K219 Gastro-esophageal reflux disease without esophagitis: Secondary | ICD-10-CM | POA: Diagnosis present

## 2024-10-23 DIAGNOSIS — Z823 Family history of stroke: Secondary | ICD-10-CM

## 2024-10-23 DIAGNOSIS — R578 Other shock: Secondary | ICD-10-CM | POA: Diagnosis not present

## 2024-10-23 DIAGNOSIS — N280 Ischemia and infarction of kidney: Secondary | ICD-10-CM | POA: Diagnosis not present

## 2024-10-23 DIAGNOSIS — F17210 Nicotine dependence, cigarettes, uncomplicated: Secondary | ICD-10-CM | POA: Diagnosis present

## 2024-10-23 DIAGNOSIS — E876 Hypokalemia: Secondary | ICD-10-CM | POA: Diagnosis present

## 2024-10-23 DIAGNOSIS — Z8679 Personal history of other diseases of the circulatory system: Secondary | ICD-10-CM | POA: Diagnosis not present

## 2024-10-23 DIAGNOSIS — Z79890 Hormone replacement therapy (postmenopausal): Secondary | ICD-10-CM

## 2024-10-23 DIAGNOSIS — E43 Unspecified severe protein-calorie malnutrition: Secondary | ICD-10-CM | POA: Diagnosis not present

## 2024-10-23 DIAGNOSIS — N179 Acute kidney failure, unspecified: Secondary | ICD-10-CM | POA: Diagnosis not present

## 2024-10-23 DIAGNOSIS — M792 Neuralgia and neuritis, unspecified: Secondary | ICD-10-CM | POA: Diagnosis not present

## 2024-10-23 DIAGNOSIS — Z882 Allergy status to sulfonamides status: Secondary | ICD-10-CM

## 2024-10-23 DIAGNOSIS — Z955 Presence of coronary angioplasty implant and graft: Secondary | ICD-10-CM

## 2024-10-23 DIAGNOSIS — R4182 Altered mental status, unspecified: Secondary | ICD-10-CM | POA: Diagnosis not present

## 2024-10-23 DIAGNOSIS — F419 Anxiety disorder, unspecified: Secondary | ICD-10-CM | POA: Diagnosis present

## 2024-10-23 HISTORY — PX: VENTRAL HERNIA REPAIR: SHX424

## 2024-10-23 LAB — CBC WITH DIFFERENTIAL/PLATELET
Abs Immature Granulocytes: 0.07 K/uL (ref 0.00–0.07)
Basophils Absolute: 0 K/uL (ref 0.0–0.1)
Basophils Relative: 0 %
Eosinophils Absolute: 0 K/uL (ref 0.0–0.5)
Eosinophils Relative: 0 %
HCT: 25.2 % — ABNORMAL LOW (ref 36.0–46.0)
Hemoglobin: 8.1 g/dL — ABNORMAL LOW (ref 12.0–15.0)
Immature Granulocytes: 1 %
Lymphocytes Relative: 6 %
Lymphs Abs: 0.7 K/uL (ref 0.7–4.0)
MCH: 28.6 pg (ref 26.0–34.0)
MCHC: 32.1 g/dL (ref 30.0–36.0)
MCV: 89 fL (ref 80.0–100.0)
Monocytes Absolute: 0.8 K/uL (ref 0.1–1.0)
Monocytes Relative: 7 %
Neutro Abs: 10.1 K/uL — ABNORMAL HIGH (ref 1.7–7.7)
Neutrophils Relative %: 86 %
Platelets: 117 K/uL — ABNORMAL LOW (ref 150–400)
RBC: 2.83 MIL/uL — ABNORMAL LOW (ref 3.87–5.11)
RDW: 16 % — ABNORMAL HIGH (ref 11.5–15.5)
WBC: 11.7 K/uL — ABNORMAL HIGH (ref 4.0–10.5)
nRBC: 0 % (ref 0.0–0.2)

## 2024-10-23 LAB — COMPREHENSIVE METABOLIC PANEL WITH GFR
ALT: 26 U/L (ref 0–44)
AST: 43 U/L — ABNORMAL HIGH (ref 15–41)
Albumin: 2 g/dL — ABNORMAL LOW (ref 3.5–5.0)
Alkaline Phosphatase: 32 U/L — ABNORMAL LOW (ref 38–126)
Anion gap: 11 (ref 5–15)
BUN: 22 mg/dL (ref 8–23)
CO2: 14 mmol/L — ABNORMAL LOW (ref 22–32)
Calcium: 5.7 mg/dL — CL (ref 8.9–10.3)
Chloride: 115 mmol/L — ABNORMAL HIGH (ref 98–111)
Creatinine, Ser: 1.81 mg/dL — ABNORMAL HIGH (ref 0.44–1.00)
GFR, Estimated: 30 mL/min — ABNORMAL LOW (ref >60)
Glucose, Bld: 203 mg/dL — ABNORMAL HIGH (ref 70–99)
Potassium: 5.3 mmol/L — ABNORMAL HIGH (ref 3.5–5.1)
Sodium: 139 mmol/L (ref 135–145)
Total Bilirubin: <0.2 mg/dL (ref 0.0–1.2)
Total Protein: <3 g/dL — ABNORMAL LOW (ref 6.5–8.1)

## 2024-10-23 LAB — GLUCOSE, CAPILLARY
Glucose-Capillary: 109 mg/dL — ABNORMAL HIGH (ref 70–99)
Glucose-Capillary: 149 mg/dL — ABNORMAL HIGH (ref 70–99)

## 2024-10-23 LAB — CBC
HCT: 13.8 % — ABNORMAL LOW (ref 36.0–46.0)
HCT: 11.8 % — ABNORMAL LOW (ref 36.0–46.0)
Hemoglobin: 4.3 g/dL — CL (ref 12.0–15.0)
Hemoglobin: 3.8 g/dL — CL (ref 12.0–15.0)
MCH: 31.4 pg (ref 26.0–34.0)
MCH: 29 pg (ref 26.0–34.0)
MCHC: 31.2 g/dL (ref 30.0–36.0)
MCHC: 32.2 g/dL (ref 30.0–36.0)
MCV: 100.7 fL — ABNORMAL HIGH (ref 80.0–100.0)
MCV: 90.1 fL (ref 80.0–100.0)
Platelets: 142 K/uL — ABNORMAL LOW (ref 150–400)
Platelets: 75 K/uL — ABNORMAL LOW (ref 150–400)
RBC: 1.37 MIL/uL — ABNORMAL LOW (ref 3.87–5.11)
RBC: 1.31 MIL/uL — ABNORMAL LOW (ref 3.87–5.11)
RDW: 13.2 % (ref 11.5–15.5)
RDW: 17.6 % — ABNORMAL HIGH (ref 11.5–15.5)
WBC: 10.2 K/uL (ref 4.0–10.5)
WBC: 13.4 K/uL — ABNORMAL HIGH (ref 4.0–10.5)
nRBC: 0 % (ref 0.0–0.2)
nRBC: 0 % (ref 0.0–0.2)

## 2024-10-23 LAB — MAGNESIUM: Magnesium: 1.6 mg/dL — ABNORMAL LOW (ref 1.7–2.4)

## 2024-10-23 LAB — PREPARE RBC (CROSSMATCH)

## 2024-10-23 LAB — LACTIC ACID, PLASMA: Lactic Acid, Venous: 4.7 mmol/L (ref 0.5–1.9)

## 2024-10-23 MED ORDER — SODIUM CHLORIDE 0.9 % IV SOLN: INTRAVENOUS | Status: DC

## 2024-10-23 MED ORDER — CHLORHEXIDINE GLUCONATE CLOTH 2 % EX PADS: 6.0000 | MEDICATED_PAD | Freq: Once | CUTANEOUS | Status: DC

## 2024-10-23 MED ORDER — ROCURONIUM BROMIDE 10 MG/ML (PF) SYRINGE
PREFILLED_SYRINGE | INTRAVENOUS | Status: DC | PRN
  Administered 2024-10-23: 50 mg via INTRAVENOUS

## 2024-10-23 MED ORDER — FENTANYL CITRATE (PF) 100 MCG/2ML IJ SOLN
INTRAMUSCULAR | Status: AC
  Filled 2024-10-23: qty 2

## 2024-10-23 MED ORDER — CHLORHEXIDINE GLUCONATE 0.12 % MT SOLN
15.0000 mL | Freq: Once | OROMUCOSAL | Status: AC
  Administered 2024-10-23: 15 mL via OROMUCOSAL

## 2024-10-23 MED ORDER — HYDROMORPHONE HCL 1 MG/ML IJ SOLN: 1.0000 mg | INTRAMUSCULAR | Status: DC | PRN

## 2024-10-23 MED ORDER — KETOROLAC TROMETHAMINE 15 MG/ML IJ SOLN
15.0000 mg | Freq: Once | INTRAMUSCULAR | Status: AC
  Administered 2024-10-23: 15 mg via INTRAVENOUS

## 2024-10-23 MED ORDER — AMISULPRIDE (ANTIEMETIC) 5 MG/2ML IV SOLN
INTRAVENOUS | Status: AC
  Filled 2024-10-23: qty 4

## 2024-10-23 MED ORDER — MIDAZOLAM HCL 5 MG/5ML IJ SOLN
INTRAMUSCULAR | Status: DC | PRN
  Administered 2024-10-23: 1 mg via INTRAVENOUS

## 2024-10-23 MED ORDER — KETOROLAC TROMETHAMINE 15 MG/ML IJ SOLN
INTRAMUSCULAR | Status: AC
  Filled 2024-10-23: qty 1

## 2024-10-23 MED ORDER — ONDANSETRON 4 MG PO TBDP: 4.0000 mg | ORAL_TABLET | Freq: Four times a day (QID) | ORAL | Status: DC | PRN

## 2024-10-23 MED ORDER — LEVOTHYROXINE SODIUM 75 MCG PO TABS
75.0000 ug | ORAL_TABLET | Freq: Every day | ORAL | Status: DC
  Administered 2024-10-25: 75 ug via ORAL
  Filled 2024-10-23: qty 1

## 2024-10-23 MED ORDER — ROCURONIUM BROMIDE 10 MG/ML (PF) SYRINGE
PREFILLED_SYRINGE | INTRAVENOUS | Status: AC
  Filled 2024-10-23: qty 10

## 2024-10-23 MED ORDER — 0.9 % SODIUM CHLORIDE (POUR BTL) OPTIME
TOPICAL | Status: DC | PRN
  Administered 2024-10-23: 1000 mL

## 2024-10-23 MED ORDER — ONDANSETRON HCL 4 MG/2ML IJ SOLN
INTRAMUSCULAR | Status: DC | PRN
  Administered 2024-10-23: 4 mg via INTRAVENOUS

## 2024-10-23 MED ORDER — METHOCARBAMOL 1000 MG/10ML IJ SOLN
500.0000 mg | Freq: Once | INTRAMUSCULAR | Status: AC
  Administered 2024-10-23: 500 mg via INTRAVENOUS

## 2024-10-23 MED ORDER — OXYCODONE HCL 5 MG PO TABS
5.0000 mg | ORAL_TABLET | Freq: Once | ORAL | Status: AC | PRN
  Administered 2024-10-23: 5 mg via ORAL

## 2024-10-23 MED ORDER — ALBUMIN HUMAN 5 % IV SOLN
INTRAVENOUS | Status: AC
  Filled 2024-10-23: qty 250

## 2024-10-23 MED ORDER — PHENYLEPHRINE 80 MCG/ML (10ML) SYRINGE FOR IV PUSH (FOR BLOOD PRESSURE SUPPORT)
PREFILLED_SYRINGE | INTRAVENOUS | Status: DC | PRN
  Administered 2024-10-23: 80 ug via INTRAVENOUS
  Administered 2024-10-23: 40 ug via INTRAVENOUS

## 2024-10-23 MED ORDER — OXYCODONE HCL 5 MG PO TABS
ORAL_TABLET | ORAL | Status: AC
  Filled 2024-10-23: qty 1

## 2024-10-23 MED ORDER — METHOCARBAMOL 1000 MG/10ML IJ SOLN
INTRAMUSCULAR | Status: AC
  Filled 2024-10-23: qty 10

## 2024-10-23 MED ORDER — ACETAMINOPHEN 500 MG PO TABS
1000.0000 mg | ORAL_TABLET | ORAL | Status: AC
  Administered 2024-10-23: 1000 mg via ORAL

## 2024-10-23 MED ORDER — LIDOCAINE HCL (PF) 2 % IJ SOLN
INTRAMUSCULAR | Status: DC | PRN
  Administered 2024-10-23: 40 mg via INTRADERMAL

## 2024-10-23 MED ORDER — ONDANSETRON HCL 4 MG/2ML IJ SOLN
4.0000 mg | Freq: Four times a day (QID) | INTRAMUSCULAR | Status: DC | PRN
  Administered 2024-10-23: 4 mg via INTRAVENOUS
  Filled 2024-10-23: qty 2

## 2024-10-23 MED ORDER — SODIUM CHLORIDE 0.9 % IV BOLUS
1000.0000 mL | Freq: Once | INTRAVENOUS | Status: AC
  Administered 2024-10-23: 1000 mL via INTRAVENOUS

## 2024-10-23 MED ORDER — NITROGLYCERIN 0.4 MG SL SUBL: 0.4000 mg | SUBLINGUAL_TABLET | SUBLINGUAL | Status: DC | PRN

## 2024-10-23 MED ORDER — MIDAZOLAM HCL 2 MG/2ML IJ SOLN
INTRAMUSCULAR | Status: AC
  Filled 2024-10-23: qty 2

## 2024-10-23 MED ORDER — HYDROMORPHONE HCL 1 MG/ML IJ SOLN
0.5000 mg | INTRAMUSCULAR | Status: DC | PRN
  Administered 2024-10-25 – 2024-10-27 (×7): 0.5 mg via INTRAVENOUS
  Filled 2024-10-23 (×7): qty 1

## 2024-10-23 MED ORDER — SODIUM CHLORIDE 0.9% IV SOLUTION: Freq: Once | INTRAVENOUS | Status: AC

## 2024-10-23 MED ORDER — MEPERIDINE HCL 25 MG/ML IJ SOLN: 6.2500 mg | INTRAMUSCULAR | Status: DC | PRN

## 2024-10-23 MED ORDER — GLYCOPYRROLATE 0.2 MG/ML IJ SOLN
INTRAMUSCULAR | Status: AC
  Filled 2024-10-23: qty 1

## 2024-10-23 MED ORDER — HYDROMORPHONE HCL 1 MG/ML IJ SOLN
INTRAMUSCULAR | Status: AC
  Filled 2024-10-23: qty 1

## 2024-10-23 MED ORDER — METHOCARBAMOL 500 MG PO TABS: 500.0000 mg | ORAL_TABLET | Freq: Four times a day (QID) | ORAL | Status: DC | PRN

## 2024-10-23 MED ORDER — EPHEDRINE SULFATE (PRESSORS) 25 MG/5ML IV SOSY
PREFILLED_SYRINGE | INTRAVENOUS | Status: DC | PRN
  Administered 2024-10-23: 5 mg via INTRAVENOUS
  Administered 2024-10-23 (×2): 10 mg via INTRAVENOUS

## 2024-10-23 MED ORDER — ONDANSETRON HCL 4 MG/2ML IJ SOLN
INTRAMUSCULAR | Status: AC
  Filled 2024-10-23: qty 2

## 2024-10-23 MED ORDER — GLYCOPYRROLATE 0.2 MG/ML IJ SOLN
INTRAMUSCULAR | Status: DC | PRN
  Administered 2024-10-23: .2 mg via INTRAVENOUS

## 2024-10-23 MED ORDER — ALBUMIN HUMAN 5 % IV SOLN
12.5000 g | Freq: Once | INTRAVENOUS | Status: AC
  Administered 2024-10-23: 12.5 g via INTRAVENOUS

## 2024-10-23 MED ORDER — HYDROMORPHONE HCL 1 MG/ML IJ SOLN
0.2500 mg | INTRAMUSCULAR | Status: DC | PRN
  Administered 2024-10-23 (×4): 0.5 mg via INTRAVENOUS

## 2024-10-23 MED ORDER — CHLORHEXIDINE GLUCONATE CLOTH 2 % EX PADS
6.0000 | MEDICATED_PAD | Freq: Every day | CUTANEOUS | Status: DC
  Administered 2024-10-24 – 2024-11-17 (×27): 6 via TOPICAL

## 2024-10-23 MED ORDER — DEXAMETHASONE SOD PHOSPHATE PF 10 MG/ML IJ SOLN
INTRAMUSCULAR | Status: DC | PRN
  Administered 2024-10-23: 4 mg via INTRAVENOUS

## 2024-10-23 MED ORDER — OXYCODONE-ACETAMINOPHEN 5-325 MG PO TABS: 1.0000 | ORAL_TABLET | ORAL | 0 refills | Status: DC | PRN

## 2024-10-23 MED ORDER — CALCIUM GLUCONATE-NACL 2-0.675 GM/100ML-% IV SOLN
2.0000 g | Freq: Once | INTRAVENOUS | Status: AC
  Administered 2024-10-24: 2000 mg via INTRAVENOUS
  Filled 2024-10-23: qty 100

## 2024-10-23 MED ORDER — OXYCODONE HCL 5 MG PO TABS
5.0000 mg | ORAL_TABLET | ORAL | Status: DC | PRN
  Administered 2024-10-26: 10 mg via ORAL
  Filled 2024-10-23: qty 2

## 2024-10-23 MED ORDER — LACTATED RINGERS IV SOLN: INTRAVENOUS | Status: DC

## 2024-10-23 MED ORDER — SUGAMMADEX SODIUM 200 MG/2ML IV SOLN
INTRAVENOUS | Status: DC | PRN
  Administered 2024-10-23: 200 mg via INTRAVENOUS

## 2024-10-23 MED ORDER — PHENYLEPHRINE 80 MCG/ML (10ML) SYRINGE FOR IV PUSH (FOR BLOOD PRESSURE SUPPORT)
PREFILLED_SYRINGE | INTRAVENOUS | Status: AC
  Filled 2024-10-23: qty 10

## 2024-10-23 MED ORDER — BUPIVACAINE-EPINEPHRINE 0.25% -1:200000 IJ SOLN
INTRAMUSCULAR | Status: DC | PRN
  Administered 2024-10-23: 9 mL

## 2024-10-23 MED ORDER — FENTANYL CITRATE (PF) 50 MCG/ML IJ SOSY
25.0000 ug | PREFILLED_SYRINGE | Freq: Once | INTRAMUSCULAR | Status: AC
  Administered 2024-10-23: 25 ug via INTRAVENOUS

## 2024-10-23 MED ORDER — NOREPINEPHRINE 4 MG/250ML-% IV SOLN
0.0000 ug/min | INTRAVENOUS | Status: DC
  Administered 2024-10-24: 16 ug/min via INTRAVENOUS
  Filled 2024-10-23: qty 250

## 2024-10-23 MED ORDER — BUPIVACAINE-EPINEPHRINE (PF) 0.25% -1:200000 IJ SOLN
INTRAMUSCULAR | Status: AC
  Filled 2024-10-23: qty 30

## 2024-10-23 MED ORDER — PROPOFOL 10 MG/ML IV BOLUS
INTRAVENOUS | Status: AC
  Filled 2024-10-23: qty 20

## 2024-10-23 MED ORDER — SUCCINYLCHOLINE CHLORIDE 200 MG/10ML IV SOSY
PREFILLED_SYRINGE | INTRAVENOUS | Status: AC
  Filled 2024-10-23: qty 10

## 2024-10-23 MED ORDER — SUGAMMADEX SODIUM 200 MG/2ML IV SOLN
INTRAVENOUS | Status: AC
  Filled 2024-10-23: qty 2

## 2024-10-23 MED ORDER — ACETAMINOPHEN 500 MG PO TABS
1000.0000 mg | ORAL_TABLET | Freq: Four times a day (QID) | ORAL | Status: DC | PRN
  Filled 2024-10-23: qty 2

## 2024-10-23 MED ORDER — GABAPENTIN 300 MG PO CAPS: 600.0000 mg | ORAL_CAPSULE | Freq: Three times a day (TID) | ORAL | Status: DC

## 2024-10-23 MED ORDER — ORAL CARE MOUTH RINSE: 15.0000 mL | Freq: Once | OROMUCOSAL | Status: AC

## 2024-10-23 MED ORDER — SODIUM CHLORIDE 0.9 % IV BOLUS
500.0000 mL | Freq: Once | INTRAVENOUS | Status: AC | PRN
  Administered 2024-10-23: 500 mL via INTRAVENOUS

## 2024-10-23 MED ORDER — FENTANYL CITRATE (PF) 100 MCG/2ML IJ SOLN
INTRAMUSCULAR | Status: DC | PRN
  Administered 2024-10-23: 100 ug via INTRAVENOUS
  Administered 2024-10-23: 50 ug via INTRAVENOUS

## 2024-10-23 MED ORDER — OXYCODONE HCL 5 MG/5ML PO SOLN: 5.0000 mg | Freq: Once | ORAL | Status: AC | PRN

## 2024-10-23 MED ORDER — FENTANYL CITRATE (PF) 50 MCG/ML IJ SOSY
PREFILLED_SYRINGE | INTRAMUSCULAR | Status: AC
  Filled 2024-10-23: qty 1

## 2024-10-23 MED ORDER — AMISULPRIDE (ANTIEMETIC) 5 MG/2ML IV SOLN
10.0000 mg | Freq: Once | INTRAVENOUS | Status: AC | PRN
  Administered 2024-10-23: 10 mg via INTRAVENOUS

## 2024-10-23 MED ORDER — HYDROMORPHONE HCL 1 MG/ML IJ SOLN
2.0000 mg | Freq: Once | INTRAMUSCULAR | Status: AC
  Administered 2024-10-23: 2 mg via INTRAVENOUS
  Filled 2024-10-23: qty 2

## 2024-10-23 MED ORDER — NOREPINEPHRINE 4 MG/250ML-% IV SOLN
0.0000 ug/min | INTRAVENOUS | Status: DC
  Administered 2024-10-23: 5 ug/min via INTRAVENOUS
  Filled 2024-10-23: qty 250

## 2024-10-23 MED ORDER — CEFAZOLIN SODIUM-DEXTROSE 2-4 GM/100ML-% IV SOLN
2.0000 g | INTRAVENOUS | Status: AC
  Administered 2024-10-23: 2 g via INTRAVENOUS
  Filled 2024-10-23: qty 100

## 2024-10-23 MED ORDER — EPHEDRINE 5 MG/ML INJ
INTRAVENOUS | Status: AC
  Filled 2024-10-23: qty 5

## 2024-10-23 MED ORDER — PROPOFOL 10 MG/ML IV BOLUS
INTRAVENOUS | Status: DC | PRN
  Administered 2024-10-23: 100 mg via INTRAVENOUS

## 2024-10-23 SURGICAL SUPPLY — 37 items
BAG COUNTER SPONGE SURGICOUNT (BAG) IMPLANT
BINDER ABDOMINAL 12 ML 46-62 (SOFTGOODS) IMPLANT
CHLORAPREP W/TINT 26 (MISCELLANEOUS) ×1 IMPLANT
CLIP APPLIE 5 13 M/L LIGAMAX5 (MISCELLANEOUS) IMPLANT
DEFOGGER SCOPE WARM SEASHARP (MISCELLANEOUS) ×1 IMPLANT
DERMABOND ADVANCED .7 DNX12 (GAUZE/BANDAGES/DRESSINGS) ×1 IMPLANT
DEVICE SECURE STRAP 25 ABSORB (INSTRUMENTS) IMPLANT
DEVICE TROCAR PUNCTURE CLOSURE (ENDOMECHANICALS) ×1 IMPLANT
ELECT REM PT RETURN 15FT ADLT (MISCELLANEOUS) ×1 IMPLANT
GLOVE BIO SURGEON STRL SZ7.5 (GLOVE) ×1 IMPLANT
GOWN STRL REUS W/ TWL XL LVL3 (GOWN DISPOSABLE) ×3 IMPLANT
IRRIGATION SUCT STRKRFLW 2 WTP (MISCELLANEOUS) IMPLANT
KIT BASIN OR (CUSTOM PROCEDURE TRAY) ×1 IMPLANT
KIT TURNOVER KIT A (KITS) ×1 IMPLANT
MARKER SKIN DUAL TIP RULER LAB (MISCELLANEOUS) ×1 IMPLANT
MESH VENTRLGHT ELLIPSE 8X6XMFL (Mesh Specialty) IMPLANT
NDL INSUFFLATION 14GA 120MM (NEEDLE) ×2 IMPLANT
PAD POSITIONING PINK XL (MISCELLANEOUS) IMPLANT
PENCIL SMOKE EVACUATOR (MISCELLANEOUS) IMPLANT
POUCH LAPAROSCOPIC INSTRUMENT (MISCELLANEOUS) ×1 IMPLANT
PROTECTOR NERVE ULNAR (MISCELLANEOUS) IMPLANT
SCISSORS LAP 5X35 DISP (ENDOMECHANICALS) ×1 IMPLANT
SET TUBE SMOKE EVAC HIGH FLOW (TUBING) ×1 IMPLANT
SHEARS HARMONIC 36 ACE (MISCELLANEOUS) IMPLANT
SLEEVE Z-THREAD 5X100MM (TROCAR) ×2 IMPLANT
SPIKE FLUID TRANSFER (MISCELLANEOUS) ×1 IMPLANT
SUT CHROMIC 2 0 SH (SUTURE) ×1 IMPLANT
SUT ETHIBOND 0 MO6 C/R (SUTURE) IMPLANT
SUT MNCRL AB 4-0 PS2 18 (SUTURE) ×1 IMPLANT
SUT NOVA NAB GS-21 1 T12 (SUTURE) IMPLANT
SUT PDS AB 0 CT1 36 (SUTURE) IMPLANT
SUT PDS AB 1 CT1 27 (SUTURE) IMPLANT
SUT PROLENE 2 0 KS (SUTURE) ×1 IMPLANT
SUT VICRYL 0 UR6 27IN ABS (SUTURE) IMPLANT
TAPE CLOTH 4X10 WHT NS (GAUZE/BANDAGES/DRESSINGS) IMPLANT
TOWEL OR DSP ST BLU DLX 10/PK (DISPOSABLE) ×1 IMPLANT
TRAY LAPAROSCOPIC (CUSTOM PROCEDURE TRAY) ×1 IMPLANT

## 2024-10-23 NOTE — Op Note (Signed)
 10/23/2024  9:22 AM  PATIENT:  Erin [name]  68 y.o. female  PRE-OPERATIVE DIAGNOSIS:  INCISIONAL HERNIA 7x4cm  POST-OPERATIVE DIAGNOSIS:  INCISIONAL HERNIA 7x4cm  PROCEDURE:  Procedure(s): REPAIR, HERNIA, VENTRAL, LAPAROSCOPIC (N/A)  SURGEON:  Surgeons and Role:    * Rubin Calamity, MD - Primary  ASSISTANTS: none   ANESTHESIA:   local and general  EBL:  minimal   BLOOD ADMINISTERED:none  DRAINS: none   LOCAL MEDICATIONS USED:  BUPIVICAINE   SPECIMEN:  No Specimen  DISPOSITION OF SPECIMEN:  N/A  COUNTS:  YES  TOURNIQUET:  * No tourniquets in log *  DICTATION: .Dragon Dictation Findings: 7x4cm incisional hernia  Details of the procedure:  After the patient was consented patient was taken back to the operating room patient was then placed in supine position bilateral SCDs in place.  The patient was prepped and draped in the usual sterile fashion. After antibiotics were confirmed a timeout was called and all facts were verified. The Veress needle technique was used to insuflate the abdomen at Palmer's point. The abdomen was insufflated to 14 mm mercury. Subsequently a 5 mm trocar was placed a camera inserted there was no injury to any intra-abdominal organs.    There was seen to be an non-incarcerated  7x4cm hernia.  A second camera port was in placed into the left lower quadrant.   At this the Falicform ligament was taken down with Bovie cautery maintaining hemostasis.  I proceeded to reduce the hernia contents.  The hernia sac was dissected out of the hernia and disposed.  The fascia at the hernia was reapproximated using a #0 Ethibonds x 6.  Once the hernia was cleared away, a Bard Ventralight 15x20cm  mesh was inserted into the abdomen.  The mesh was secured circumferentially with am Securestrap tacker in a double crown fashion.   The omentum was brought over the area of the mesh. The fascia at the left lower quadrant port site was reapproximated with a 0 vicryl  and an endoclose device.  The pneumoperitoneum was evacuated & all trocars  were removed. The skin was reapproximated with 4-0  Monocryl sutures in a subcuticular fashion. The skin was dressed with Dermabond.  The patient was taken to the recovery room in stable condition.  Type of repair -primary suture & mesh  Mesh overlap - 4cm  Placement of mesh -  beneath fascia and into peritoneal cavity  Size: 3-10cm, Primary Hernia, and Reducible Hernia    PLAN OF CARE: Discharge to home after PACU  PATIENT DISPOSITION:  PACU - hemodynamically stable.   Delay start of Pharmacological VTE agent (>24hrs) due to surgical blood loss or risk of bleeding: not applicable

## 2024-10-23 NOTE — Anesthesia Postprocedure Evaluation (Addendum)
 Anesthesia Post Note  Patient: Erin [name]  Procedure(s) Performed: REPAIR, HERNIA, VENTRAL, LAPAROSCOPIC     Patient location during evaluation: PACU Anesthesia Type: General Level of consciousness: awake and alert Pain management: pain level controlled Respiratory status: spontaneous breathing, nonlabored ventilation and respiratory function stable Postop Assessment: no apparent nausea or vomiting Anesthetic complications: no Comments: Patient seen in Phase II PACU after call from RN that patient had episode of unresponsiveness after IV was removed. Per report from previous anesthesiologist involved in her care, she has had borderline BP in phase 1 PACU but improved after additional IVF and albumin boluses. She appears pale and sleepy but is responsive to voice. IV restarted and IVF bolus given. iStat notable for Hgb 5.4 from 10.5 preop. Abdomen is distended but soft on palpation. Dr. Rubin called and present at bedside to examine patient. He plans to admit for further observation. Lawence, MD  Addendum 16:30: Patient has received 2u pRBCs with improvement of BP to 102/58 immediately after transfusion completed. Patient is awake and conversant, complaining of moderate abdominal pain for which fentanyl  given judiciously. Status discussed with Dr. Rubin who will admit patient to step-down unit. Repeat stat CBC ordered to be followed up by surgeon. Lawence, MD   No notable events documented.  Last Vitals:  Vitals:   10/23/24 1420 10/23/24 1445  BP: 98/63 (!) 102/58  Pulse: (!) 52 (!) 52  Resp:  10  Temp:  36.7 C  SpO2: 92% 100%    Last Pain:  Vitals:   10/23/24 1609  TempSrc:   PainSc: 7                  Erin [name]

## 2024-10-23 NOTE — Significant Event (Signed)
 Rapid Response Event Note   Reason for Call :  Hypotension   Initial Focused Assessment:  Patient in Stepdown unit from surgery. Patient A/O and drowsy. Surgery notified of low BP nursing instructed to place patient in trendelenburg. BP did improve some but still low 89/56. Rapid Response protocol 500 ML bolus ordered. Surgery consulting CCM for likely need of vasopressers.   MD Notified: Bernarda Ned MD Call Time: [phone] Arrival Time: 1815 End Time: 1840  Omega LILLETTE Settles, RN

## 2024-10-23 NOTE — Progress Notes (Signed)
 Hgb 4.3.  Plan to admit to SDU. Will start transfusion here in PACU.  Abd is soft still and bruising not worse.  D/w Anesth MD

## 2024-10-23 NOTE — Progress Notes (Addendum)
 eLink Physician-Brief Progress Note Patient Name: LOREENA VALERI DOB: 1955/12/21 MRN: [id number]   Date of Service  10/23/2024  HPI/Events of Note  Notified about critical hemoglobin down to 3.8.  Do not see that any blood products were received today.  DIC panel positive  eICU Interventions  Will order blood transfusion and trend of labs - 3 units PRBC, stay to ahead - 2 Supples of cryoprecipitate - 2 units FFP   2357 -CT angiography with GI bleed protocol, calcium , repeat lactic with a.m. labs  Intervention Category Intermediate Interventions: Bleeding - evaluation and treatment with blood products  Rana Hochstein 10/23/2024, 11:45 PM

## 2024-10-23 NOTE — Procedures (Signed)
 Central Venous Catheter Insertion Procedure Note  Erin [name]  [id number]  1955/12/09  Date:10/23/24  Time:10:33 PM   Provider Performing:Elvenia Godden Layman   Procedure: Insertion of Non-tunneled Central Venous Catheter(36556) with US  guidance (23062)   Indication(s) Medication administration  Consent Risks of the procedure as well as the alternatives and risks of each were explained to the patient and/or caregiver.  Consent for the procedure was obtained and is signed in the bedside chart  Anesthesia Topical only with 1% lidocaine    Timeout Verified patient identification, verified procedure, site/side was marked, verified correct patient position, special equipment/implants available, medications/allergies/relevant history reviewed, required imaging and test results available.  Sterile Technique Maximal sterile technique including full sterile barrier drape, hand hygiene, sterile gown, sterile gloves, mask, hair covering, sterile ultrasound probe cover (if used).  Procedure Description Area of catheter insertion was cleaned with chlorhexidine  and draped in sterile fashion.  With real-time ultrasound guidance a central venous catheter was placed into the left internal jugular vein. Nonpulsatile blood flow and easy flushing noted in all ports.  The catheter was sutured in place and sterile dressing applied.  Complications/Tolerance None; patient tolerated the procedure well. Chest X-ray is ordered to verify placement for internal jugular or subclavian cannulation.   Chest x-ray is not ordered for femoral cannulation.  EBL Minimal  Specimen(s) None

## 2024-10-23 NOTE — Progress Notes (Signed)
 Cross covering ICU physician   Called to assess pt for post operative pressors. Pt with complex medical history, severe anemia to 4-5, thrombocytopenia. Hypothermia, hypotension on levo.   Spoke with family and pt will place cvc. Order appropriate labs including. Cbc/dic/cmp/mag/lactate in light of her pallor, hypotension, recent mesenteric artery stenting procedure, nausea and severe pain.   RN at bedside updated as well.

## 2024-10-23 NOTE — H&P (Signed)
 Chief Complaint: New Problem  (discuss robotic hernia repair )       History of Present Illness: Erin [name] is a 68 y.o. female who is seen today as an office consultation at the request of Dr. Ozell for evaluation of New Problem  (discuss robotic hernia repair ) .   History of Present Illness Erin [name] is a 68 year old female who presents with a hernia. She was referred by Dr. Signe for evaluation of her hernia.   Abdominal wall hernia - Hernia present for 2 years, initially developed after lifting activities during a move - Progressive increase in size associated with continued physical activity - Initial pain at onset, currently no pain associated with the hernia   Post-procedural symptoms - Recent kidney stent placement for stenosis - Colonoscopy performed 2 weeks ago - Soreness present following recent procedures, not associated with hernia   Vascular surgical history - Bilateral femoral bypass surgery in 2014   Cardiac health - Under care of Glendia Ferrier, PA, for cardiac health - No history of myocardial infarction           Review of Systems: A complete review of systems was obtained from the patient.  I have reviewed this information and discussed as appropriate with the patient.  See HPI as well for other ROS.   Review of Systems  Constitutional:  Negative for fever.  HENT:  Negative for congestion.   Eyes:  Negative for blurred vision.  Respiratory:  Negative for cough, shortness of breath and wheezing.   Cardiovascular:  Negative for chest pain and palpitations.  Gastrointestinal:  Negative for heartburn.  Genitourinary:  Negative for dysuria.  Musculoskeletal:  Negative for myalgias.  Skin:  Negative for rash.  Neurological:  Negative for dizziness and headaches.  Psychiatric/Behavioral:  Negative for depression and suicidal ideas.   All other systems reviewed and are negative.       Medical History: Past Medical History: DiagnosisDate             Anemia                        Anxiety                        Arthritis                        GERD (gastroesophageal reflux disease)                  History of cancer                     Thyroid  disease               There is no problem list on file for this patient.     Past Surgical History: ProcedureLateralityDate            Aorta - bilateral femoral artery bypass graf                            COLON SURGERY                            Ectopic pregnancy surgery  JOINT REPLACEMENT                          Allergies AllergenReactions LubiprostoneDiarrhea                         Uncontrollable diarrhea NortriptylineOther (See Comments)                         Stomach distention Sulfa (Sulfonamide Antibiotics)Nausea And Vomiting and Other (See Comments)                         GI distress/pain TramadolItching and Rash AtorvastatinNausea And Vomiting LoratadineOther (See Comments)                         Hot flashes     Current Outpatient Medications on File Prior to Visit MedicationSigDispenseRefill            acetaminophen  (TYLENOL ) 500 MG tablet   Take 1,000 mg by mouth                                acyclovir  (ZOVIRAX ) 5 % ointment    Apply 1 Application topically                           cholecalciferol (VITAMIN D3) 2,000 unit tablet          Take 2,000 Units by mouth once daily                               clopidogreL  (PLAVIX ) 75 mg tablet    Take 75 mg by mouth once daily                               DULoxetine  (CYMBALTA ) 20 MG DR capsule          TAKE 2-3 CAPSULES BY MOUTH EVERY DAY                                    fluticasone  propionate (FLONASE ) 50 mcg/actuation nasal spray   Place 1 spray into both nostrils once daily                           gabapentin  (NEURONTIN ) 600 MG tablet     Take 600 mg by mouth 3 (three) times daily                              ketoconazole  (NIZORAL ) 2 % shampoo        Apply to hair daily for 7  days -- leave on for 5 minutes, then wash every other day for 7 days, then wash twice a week for 7 days,  then once a week.                                    KLOR-CON  M20 20 mEq ER tablet   TAKE 1 TABLET BY MOUTH DAILY  AS NEEDED (FOR FLUID OR EDEMA).                                    levothyroxine  (SYNTHROID ) 75 MCG tablet Take 75 mcg by mouth                                   meclizine  (ANTIVERT ) 25 mg tablet  Take 25 mg by mouth 3 (three) times daily as needed                              metoclopramide  (REGLAN ) 5 MG tablet        Take 5 mg by mouth every 6 (six) hours as needed                                metoprolol  SUCCinate (TOPROL -XL) 25 MG XL tablet        Take 25 mg by mouth 2 (two) times daily                              ondansetron  (ZOFRAN ) 8 MG tablet  Take 8 mg by mouth every 8 (eight) hours as needed                              pantoprazole  (PROTONIX ) 40 MG DR tablet            Take 40 mg by mouth                         rOPINIRole  (REQUIP ) 1 MG immediate release tablet         Take 1 mg by mouth                           rosuvastatin  (CRESTOR ) 40 MG tablet         Take 40 mg by mouth once daily                               tiZANidine (ZANAFLEX) 4 MG tablet TAKE 1/2 TABLET BY MOUTH THREE TIMES A DAY AS NEEDED FOR PAIN                                triamcinolone  0.1 % cream     Apply 1 Application topically                           amLODIPine  (NORVASC ) 5 MG tablet          Take 5 mg by mouth                           nitroGLYcerin  (NITROSTAT ) 0.4 MG SL tablet         Place 0.4 mg under the tongue every 5 (five) minutes as needed  rimegepant (NURTEC ODT) 75 mg disintegrating tablet       Take 75 mg by mouth once daily as needed                         topiramate  (TOPAMAX ) 50 MG tablet            Take by mouth                           No current facility-administered medications on file prior to visit.     Family History ProblemRelationAge of  Onset            Stroke  Mother             Skin cancer     Mother             Coronary Artery Disease (Blocked arteries around heart)     Mother             Stroke  Father       Social History   Tobacco Use Smoking StatusEvery Day Current packs/day:1.00 Types:Cigarettes Smokeless TobaccoNever     Social History   Socioeconomic History Marital status:Married Tobacco Use Smoking status:Every Day                         Current packs/day:      1.00                         Types: Cigarettes Smokeless tobacco:Never Substance and Sexual Activity Alcohol ldz:Wzczm Drug ldz:Wzczm Sexual activity:Defer   Social Drivers of Health   Financial Resource Strain: Low Risk  (02/26/2024)             Received from [facility]             Overall Financial Resource Strain (CARDIA)                        Difficulty of Paying Living Expenses: Not hard at all Food Insecurity: No Food Insecurity (02/26/2024)             Received from [facility]             Hunger Vital Sign                        Within the past 12 months, you worried that your food would run out before you got the money to buy more.: Never true                        Within the past 12 months, the food you bought just didn't last and you didn't have money to get more.: Never true Transportation Needs: No Transportation Needs (02/26/2024)             Received from [facility] - Transportation                        Lack of Transportation (Medical): No                        Lack of Transportation (Non-Medical): No Physical Activity: Inactive (02/26/2024)  Received from [facility]             Exercise Vital Sign                        On average, how many days per week do you engage in moderate to strenuous exercise (like a brisk walk)?: 0 days                        On average, how many minutes do you engage in exercise at this level?: 0 min Stress: No Stress Concern Present (02/26/2024)              Received from [facility] - Occupational Stress Questionnaire                        Feeling of Stress : Not at all Social Connections: Socially Integrated (02/26/2024)             Received from [facility]             Social Connection and Isolation Panel                        In a typical week, how many times do you talk on the phone with family, friends, or neighbors?: More than three times a week                        How often do you get together with friends or relatives?: More than three times a week                        How often do you attend church or religious services?: More than 4 times per year                        Do you belong to any clubs or organizations such as church groups, unions, fraternal or athletic groups, or school groups?: Yes                        How often do you attend meetings of the clubs or organizations you belong to?: More than 4 times per year                        Are you married, widowed, divorced, separated, never married, or living with a partner?: Married Housing Stability: Unknown (06/12/2024)             Housing Stability Vital Sign                        Homeless in the Last Year: No     Objective:     BP (!) 168/74   Pulse 69   Temp 98 F (36.7 C) (Oral)   Resp 16   Ht 5' 3 (1.6 m)   Wt 44.4 kg   SpO2 99%   BMI 17.34 kg/m     There is no height or weight on file to calculate BMI.   Physical Exam Constitutional:      Appearance: Normal appearance.  HENT:     Head: Normocephalic and atraumatic.  Mouth/Throat:     Mouth: Mucous membranes are moist.     Pharynx: Oropharynx is clear.  Eyes:     General: No scleral icterus.    Pupils: Pupils are equal, round, and reactive to light.  Cardiovascular:     Rate and Rhythm: Normal rate and regular rhythm.     Pulses: Normal pulses.     Heart sounds: No murmur heard.    No friction rub. No gallop.  Pulmonary:      Effort: Pulmonary effort is normal. No respiratory distress.     Breath sounds: Normal breath sounds. No stridor.  Abdominal:     General: Abdomen is flat.     Hernia: A hernia is present.    Musculoskeletal:        General: No swelling.  Skin:    General: Skin is warm.  Neurological:     General: No focal deficit present.     Mental Status: She is alert and oriented to person, place, and time. Mental status is at baseline.  Psychiatric:        Mood and Affect: Mood normal.        Thought Content: Thought content normal.        Judgment: Judgment normal.       Hernia Size:5cm Incarcerated: no Initial Hernia     Assessment and Plan: Diagnoses and all orders for this visit:   Incisional hernia without obstruction or gangrene     Erin [name] is a 68 y.o. female    1.          We will proceed to the OR for a lap ventral hernia repair with mesh. 2.         All risks and benefits were discussed with the patient, to generally include infection, bleeding, damage to surrounding structures, acute and chronic nerve pain, and recurrence. Alternatives were offered and described.  All questions were answered and the patient voiced understanding of the procedure and wishes to proceed at this point.             No follow-ups on file.   Lynda Leos, MD, [facility], [place] [facility]

## 2024-10-23 NOTE — Progress Notes (Signed)
 Verbal order given by Dr. Harlene Na to order STAT CTA of abdomen and pelvis due to concerns of bleeding as evidenced by recent critical labs.

## 2024-10-23 NOTE — Transfer of Care (Signed)
 Immediate Anesthesia Transfer of Care Note  Patient: Erin [name]  Procedure(s) Performed: REPAIR, HERNIA, VENTRAL, LAPAROSCOPIC  Patient Location: PACU  Anesthesia Type:General  Level of Consciousness: awake, alert , oriented, and patient cooperative  Airway & Oxygen Therapy: Patient Spontanous Breathing and Patient connected to face mask oxygen  Post-op Assessment: Pt stable  Post vital signs: Reviewed and stable  Last Vitals:  Vitals Value Taken Time  BP 157/69 10/23/24 09:32  Temp    Pulse 75 10/23/24 09:36  Resp 12 10/23/24 09:36  SpO2 100 % 10/23/24 09:36  Vitals shown include unfiled device data.  Last Pain:  Vitals:   10/23/24 0709  TempSrc:   PainSc: 0-No pain         Complications: No notable events documented.

## 2024-10-23 NOTE — Discharge Instructions (Signed)

## 2024-10-23 NOTE — Anesthesia Procedure Notes (Signed)
 Procedure Name: Intubation Date/Time: 10/23/2024 8:32 AM  Performed by: Franchot Delon RAMAN, CRNAPre-anesthesia Checklist: Patient identified, Emergency Drugs available, Suction available and Patient being monitored Patient Re-evaluated:Patient Re-evaluated prior to induction Oxygen Delivery Method: Circle System Utilized Preoxygenation: Pre-oxygenation with 100% oxygen Induction Type: IV induction Ventilation: Mask ventilation without difficulty Laryngoscope Size: Mac and 3 Grade View: Grade I Tube type: Oral Tube size: 6.5 mm Number of attempts: 1 Airway Equipment and Method: Stylet Placement Confirmation: ETT inserted through vocal cords under direct vision, positive ETCO2 and breath sounds checked- equal and bilateral Secured at: 22 cm Tube secured with: Tape Dental Injury: Teeth and Oropharynx as per pre-operative assessment

## 2024-10-23 NOTE — Progress Notes (Signed)
 Called to PACU by PACU RN that pt had some low BPs and had maybe a vagal episode.  Istat appeared to show hgb 5.  CBC was ordered.  She had received several alb boluses and NS boluses.  I d/w her and her husband I will plan to admit her overnight and pending CBC results she may need a blood tranfusion  Abd wall has some bruising that is not unexpected. Abd appears to be distended but not firm  Will admit and f/u labs

## 2024-10-23 NOTE — Progress Notes (Signed)
 Patient's BP down to 67/48 and became unresponsive after PIV is removed for patient to be discharge. Notified Dr. Keneth to assess patient, MD went at bedside and order to place PIV back, give 500 bolus and stat CBC. Also MD order to place patient back to phase 1. Husband is at bedside when this is all happening and updated him about the plan of care. Patient's became responsive and recent BP is 98/63. RN help patient back to phase 1. No other concern at the moment. Plan of care continued.

## 2024-10-23 NOTE — Consult Note (Signed)
 NAME:  Erin [name], MRN:  [id number], DOB:  10-08-1956, LOS: 0 ADMISSION DATE:  10/23/2024, CONSULTATION DATE:  10/23/24 REFERRING MD:  surgery, CHIEF COMPLAINT:  hypotension   History of Present Illness:  68 yo female presented for elective hernia repair. Post operatively pt remained hypotensive on pressor. Pt is laying in bed calling out in pain of legs and abdomen. Feels that she needs to keep her legs bent. Appears very pale in skin and mucosal membranes. Pt is oriented but has requested all questions are directed to her husband at this time. Pt does endorse dizziness, despite being supine and feeling weak and cold. Otherwise ROS negative or pt is not able to verbalize other symptoms.   Husband on the phone states that leg pain is chronic and typically has been 2/2 low mag and potassium for which she is reportedly on supplementation for. Husband also states that she recently (10/14) had mesenteric artery stenting and he is concerned that this is contributing to her presentation post operatively from the hernia repair. I have discussed with him plan for cvc, repeat labs and likely cta to eval for active bleed. He agrees with plan and consents to further blood product transfusion.   Ccm was asked to consult for hypotension  Pertinent  Medical History  H/o mesenteric stent 08/2024  Significant Hospital Events: Including procedures, antibiotic start and stop dates in addition to other pertinent events   Admitted to ICU post operatively 12/3  Interim History / Subjective:    Objective    Blood pressure (!) 91/53, pulse 69, temperature (!) 97.2 F (36.2 C), temperature source Temporal, resp. rate (!) 22, height 5' 3 (1.6 m), weight 44.4 kg, SpO2 100%.        Intake/Output Summary (Last 24 hours) at 10/23/2024 2156 Last data filed at 10/23/2024 2100 Gross per 24 hour  Intake 3512.28 ml  Output 352 ml  Net 3160.28 ml   Filed Weights   10/23/24 0658 10/23/24 0709  Weight: 44.4 kg  44.4 kg    Examination: General: appears acutely ill. Extremely pale, appears uncomfortable HENT: ncat, eomi, perrla, mmm and extremely pale Lungs: ctab Cardiovascular: rrr Abdomen: soft, post operative, mildly distended, +rebound +guarding Extremities: no c/c/e Neuro: no focal deficits, aaox4 GU: deferred  Resolved problem list   Assessment and Plan   Shock, suspected hemorrhagic -recheck hgb stat -s/p 2 Uprbc  -check lactate, dic and cbc -transfuse plts <100 at this time, hgb <7 fibrinogen <100 and inr >1.7 -cta based on results, if hgb remains 8 then will hold but otherwise clinically fits the picture of active hemorrhage.  -titrate vasopressor to map >65 -monitor end points -cont IVF  -cvc placed as on escalating pressors and anticipating more product needing to be transfused -check mag/k and replace as needed -prn pain medications.    Labs   CBC: Recent Labs  Lab 10/18/24 1205 10/23/24 1436 10/23/24 1636  WBC 8.1 10.2 11.7*  NEUTROABS  --   --  10.1*  HGB 10.5* 4.3* 8.1*  HCT 32.8* 13.8* 25.2*  MCV 95.6 100.7* 89.0  PLT 295 142* 117*    Basic Metabolic Panel: Recent Labs  Lab 10/18/24 1205  NA 137  K 3.6  CL 103  CO2 22  GLUCOSE 90  BUN 25*  CREATININE 1.47*  CALCIUM  9.0   GFR: Estimated Creatinine Clearance: 25.7 mL/min (A) (by C-G formula based on SCr of 1.47 mg/dL (H)). Recent Labs  Lab 10/18/24 1205 10/23/24 1436 10/23/24 1636  WBC 8.1 10.2 11.7*    Liver Function Tests: No results for input(s): AST, ALT, ALKPHOS, BILITOT, PROT, ALBUMIN in the last 168 hours. No results for input(s): LIPASE, AMYLASE in the last 168 hours. No results for input(s): AMMONIA in the last 168 hours.  ABG    Component Value Date/Time   PHART 7.439 08/14/2017 2217   PCO2ART 32.8 08/14/2017 2217   PO2ART 409.0 (H) 08/14/2017 2217   HCO3 22.5 08/14/2017 2217   TCO2 22 09/03/2024 0835   ACIDBASEDEF 2.0 08/14/2017 2217   O2SAT 100.0  08/14/2017 2217     Coagulation Profile: No results for input(s): INR, PROTIME in the last 168 hours.  Cardiac Enzymes: No results for input(s): CKTOTAL, CKMB, CKMBINDEX, TROPONINI in the last 168 hours.  HbA1C: Hgb A1c MFr Bld  Date/Time Value Ref Range Status  10/18/2024 12:05 PM 4.9 4.8 - 5.6 % Final    Comment:    (NOTE)         Prediabetes: 5.7 - 6.4         Diabetes: >6.4         Glycemic control for adults with diabetes: <7.0   12/10/2021 03:32 PM 5.2 <5.7 % of total Hgb Final    Comment:    For the purpose of screening for the presence of diabetes: . <5.7%       Consistent with the absence of diabetes 5.7-6.4%    Consistent with increased risk for diabetes             (prediabetes) > or =6.5%  Consistent with diabetes . This assay result is consistent with a decreased risk of diabetes. . Currently, no consensus exists regarding use of hemoglobin A1c for diagnosis of diabetes in children. . According to American Diabetes Association (ADA) guidelines, hemoglobin A1c <7.0% represents optimal control in non-pregnant diabetic patients. Different metrics may apply to specific patient populations.  Standards of Medical Care in Diabetes(ADA). .     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), Anxiety, Aortoiliac occlusive disease (HCC) (08/14/2017), Arthritis, B12 deficiency anemia (09/14/2015), Blood transfusion without reported diagnosis, BPPV (benign paroxysmal positional vertigo) (07/08/2015), Cardiomyopathy (HCC) (11/03/2017), Chronic back pain, Chronic daily headache (03/29/2013), Closed right hip fracture (HCC) (09/10/2015), Coronary artery disease involving native coronary artery of native heart without angina pectoris (10/13/2017), Depression, Essential (hemorrhagic)  thrombocythemia (HCC) (09/06/2018), Essential hypertension (09/25/2023), Family history of early CAD (04/08/2016), GERD (gastroesophageal reflux disease), History of hiatal hernia, Hot flashes, Hyperlipidemia, Hyperlipidemia associated with type 2 diabetes mellitus (HCC) (05/11/2017), Hypothyroidism (09/10/2015), IBS (irritable bowel syndrome) (03/29/2013), Neck pain (07/08/2015), 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   Surgical History:   Past Surgical History:  Procedure Laterality Date   ABDOMINAL AORTOGRAM N/A 08/02/2017   Procedure: ABDOMINAL AORTOGRAM;  Surgeon: Darron Deatrice LABOR, MD;  Location: [facility];  Service: Cardiovascular;  Laterality: N/A;   ABDOMINAL AORTOGRAM W/LOWER EXTREMITY Bilateral 09/03/2024   Procedure: ABDOMINAL AORTOGRAM W/LOWER EXTREMITY;  Surgeon: Serene Gaile ORN, MD;  Location: [facility];  Service: Cardiovascular;  Laterality: Bilateral;   AORTA - BILATERAL FEMORAL ARTERY BYPASS GRAFT N/A 08/14/2017   Procedure: AORTA BIFEMORAL BYPASS GRAFT;  Surgeon: Oris Krystal FALCON, MD;  Location: [facility] OR;  Service: Vascular;  Laterality: N/A;   COLONOSCOPY     COLONOSCOPY N/A 08/29/2024   Procedure: COLONOSCOPY;  Surgeon: Wilhelmenia Aloha Raddle., MD;  Location: [facility];  Service: Gastroenterology;  Laterality: N/A;   CORONARY STENT INTERVENTION N/A 10/13/2017   Procedure: CORONARY STENT INTERVENTION;  Surgeon: Mady Bruckner, MD;  Location: [facility];  Service: Cardiovascular;  Laterality: N/A;   dental implant     ECTOPIC PREGNANCY SURGERY     EMBOLECTOMY N/A 08/14/2017   Procedure: EMBOLECTOMY FEMORAL;  Surgeon: Oris Krystal FALCON, MD;  Location: [facility] OR;  Service: Vascular;  Laterality: N/A;   FEMORAL-POPLITEAL BYPASS GRAFT Right 08/14/2017    Procedure: Right Femoral to Above Knee Popliteal Bypass Graft using Non-Reversed Greater Saphenous Vein Graft from Right Leg;  Surgeon: Oris Krystal FALCON, MD;  Location: [facility] OR;  Service: Vascular;  Laterality: Right;   FLEXIBLE SIGMOIDOSCOPY N/A 08/14/2013   Procedure: FLEXIBLE SIGMOIDOSCOPY;  Surgeon: Gwendlyn ONEIDA Buddy, MD;  Location: [facility];  Service: Endoscopy;  Laterality: N/A;   LEFT HEART CATH AND CORONARY ANGIOGRAPHY N/A 10/13/2017   Procedure: LEFT HEART CATH AND CORONARY ANGIOGRAPHY;  Surgeon: Mady Bruckner, MD;  Location: [facility];  Service: Cardiovascular;  Laterality: N/A;   LOWER EXTREMITY ANGIOGRAPHY Bilateral 08/02/2017   Procedure: Lower Extremity Angiography;  Surgeon: Darron Deatrice LABOR, MD;  Location: [facility];  Service: Cardiovascular;  Laterality: Bilateral;   LOWER EXTREMITY INTERVENTION N/A 09/03/2024   Procedure: LOWER EXTREMITY INTERVENTION;  Surgeon: Serene Gaile ORN, MD;  Location: [facility];  Service: Cardiovascular;  Laterality: N/A;   MULTIPLE TOOTH EXTRACTIONS     PERIPHERAL VASCULAR INTERVENTION  05/22/2018   Procedure: PERIPHERAL VASCULAR INTERVENTION;  Surgeon: Serene Gaile ORN, MD;  Location: [facility];  Service: Cardiovascular;;  SMA and Celiac   PILONIDAL CYST EXCISION     POLYPECTOMY     THROMBECTOMY FEMORAL ARTERY Right 08/14/2017   Procedure: THROMBECTOMY FEMORAL ARTERY;  Surgeon: Oris Krystal FALCON, MD;  Location: [facility] OR;  Service: Vascular;  Laterality: Right;   TONSILLECTOMY     TOTAL HIP ARTHROPLASTY  09/11/2015   Procedure: TOTAL HIP ARTHROPLASTY;  Surgeon: Evalene JONETTA Chancy, MD;  Location: [facility];  Service: Orthopedics;;   ULTRASOUND GUIDANCE FOR VASCULAR ACCESS  10/13/2017   Procedure: Ultrasound Guidance For Vascular Access;  Surgeon: Mady Bruckner, MD;  Location: [facility];  Service: Cardiovascular;;   VISCERAL ANGIOGRAPHY N/A 03/03/2020   Procedure: MESTENRIC ANGIOGRAPHY;  Surgeon: Serene Gaile ORN, MD;   Location: [facility];  Service: Cardiovascular;  Laterality: N/A;   VISCERAL ANGIOGRAPHY N/A 09/03/2024   Procedure: VISCERAL ANGIOGRAPHY;  Surgeon: Serene Gaile ORN, MD;  Location: [facility];  Service: Cardiovascular;  Laterality: N/A;   VISCERAL ARTERY INTERVENTION N/A 09/03/2024   Procedure: VISCERAL ARTERY INTERVENTION;  Surgeon: Serene Gaile ORN, MD;  Location: [facility];  Service: Cardiovascular;  Laterality: N/A;     Social History:   reports that she quit smoking about 10 years ago. Her smoking use included cigarettes. She has never used smokeless tobacco. She reports that she does not drink alcohol and does not use drugs.   Family History:  Her family history includes Arthritis in her mother; Dementia in her mother; Heart attack in her father, maternal grandfather, maternal uncle, and mother; Heart disease in her father and mother; Hyperlipidemia in her father and mother; Hypertension in her  father and mother; Irritable bowel syndrome in her mother; Lung cancer (age of onset: 54) in her brother; Prostate cancer in her father; Stroke (age of onset: 69) in her mother; Stroke (age of onset: 72) in her father; Thyroid  disease in her father and mother. There is no history of Colon cancer, Rectal cancer, Stomach cancer, or Esophageal cancer.   Allergies Allergies  Allergen Reactions   Latex Itching    Must use paper tape   Amitiza  [Lubiprostone ] Diarrhea    Uncontrollable diarrhea   Nortriptyline  Other (See Comments)    Stomach distention   Sulfa Antibiotics Nausea And Vomiting and Other (See Comments)    GI distress/pain   Tramadol  Itching and Rash   Atorvastatin  Nausea And Vomiting   Claritin [Loratadine] Other (See Comments)    Hot flashes     Home Medications  Prior to Admission medications   Medication Sig Start Date End Date Taking? Authorizing Provider  acetaminophen  (TYLENOL ) 500 MG tablet Take 1,000 mg by mouth every 6 (six) hours as needed for  headache (pain).   Yes [provider]  acyclovir  ointment (ZOVIRAX ) 5 % Apply 1 Application topically every 3 (three) hours as needed (fever blisters). 10/24/22  Yes [name] HERO, MD  amLODipine  (NORVASC ) 5 MG tablet Take 10 mg by mouth daily. 09/12/24  Yes [provider]  aspirin  EC 81 MG tablet Take 1 tablet (81 mg total) by mouth daily. Swallow whole. 06/19/24  Yes Weaver, Scott T, PA-C  carvedilol  (COREG ) 12.5 MG tablet Take 12.5 mg by mouth 2 (two) times daily. 09/12/24  Yes [provider]  Cholecalciferol (VITAMIN D ) 2000 units tablet Take 2,000 Units by mouth daily.   Yes [provider]  clopidogrel  (PLAVIX ) 75 MG tablet Take 1 tablet (75 mg total) by mouth daily. 08/12/24  Yes Serene Gaile ORN, MD  DULoxetine  (CYMBALTA ) 20 MG capsule TAKE 2-3 CAPSULES BY MOUTH EVERY DAY 02/26/24  Yes [name] HERO, MD  fluticasone  (FLONASE ) 50 MCG/ACT nasal spray SPRAY 1 SPRAY INTO EACH NOSTRIL DAILY 02/17/24  Yes [name] HERO, MD  gabapentin  (NEURONTIN ) 600 MG tablet TAKE 1 TABLET BY MOUTH THREE TIMES A DAY 05/29/24  Yes [name] HERO, MD  ketoconazole  (NIZORAL ) 2 % shampoo Apply to hair daily for 7 days -- leave on for 5 minutes, then wash every other day for 7 days, then wash twice a week for 7 days,  then once a week. 04/01/24  Yes [name] HERO, MD  levothyroxine  (SYNTHROID ) 75 MCG tablet TAKE 1 TABLET BY MOUTH DAILY BEFORE BREAKFAST MONDAY-FRIDAY, THEN 1/2 SATURDAY AND SUNDAY 06/28/24  Yes [name] HERO, MD  metoCLOPramide  (REGLAN ) 5 MG tablet TAKE 1 TABLET BY MOUTH EVERY 6 HOURS AS NEEDED FOR NAUSEA. 08/12/24  Yes [name] HERO, MD  nitroGLYCERIN  (NITROSTAT ) 0.4 MG SL tablet Place 1 tablet (0.4 mg total) under the tongue every 5 (five) minutes as needed for chest pain. 04/03/24 10/15/24 Yes End, Lonni, MD  nystatin -triamcinolone  (MYCOLOG II) cream Apply 1 application topically 2 (two) times daily as needed (dry skin).  03/30/16  Yes  [provider]  oxyCODONE -acetaminophen  (PERCOCET) 5-325 MG tablet Take 1 tablet by mouth every 4 (four) hours as needed for severe pain (pain score 7-10). 10/23/24 10/23/25 Yes Rubin Calamity, MD  pantoprazole  (PROTONIX ) 40 MG tablet TAKE 1 TABLET BY MOUTH TWICE A DAY 08/12/24  Yes McGreal, Inocente HERO, MD  potassium chloride  SA (KLOR-CON  M20) 20 MEQ tablet Take 1 tablet (20 mEq total)  by mouth 2 (two) times daily. 06/24/24  Yes Weaver, Scott T, PA-C  rOPINIRole  (REQUIP ) 1 MG tablet Take 1 tablet (1 mg total) by mouth at bedtime. May Increase to 2 tablet at bedtime after 7 days if needed. 09/13/24  Yes [name] HERO, MD  rosuvastatin  (CRESTOR ) 40 MG tablet Take 1 tablet (40 mg total) by mouth daily. 09/13/24  Yes [name] HERO, MD  tiZANidine (ZANAFLEX) 4 MG tablet Take 2 mg by mouth 3 (three) times daily. 12/11/20  Yes [provider]  topiramate  (TOPAMAX ) 50 MG tablet Take 50 mg by mouth 2 (two) times daily.   Yes [provider]  vitamin B-12 1000 MCG tablet Take 1 tablet (1,000 mcg total) by mouth daily. 09/14/15  Yes Dhungel, Nishant, MD  hydrALAZINE  (APRESOLINE ) 25 MG tablet Take 1 tab once daily as needed for elevated BP (>160/95) 07/17/24   Lelon Hamilton T, PA-C  meclizine  (ANTIVERT ) 25 MG tablet TAKE 1 TABLET BY MOUTH THREE TIMES A DAY AS NEEDED FOR DIZZINESS 05/01/24   [name] HERO, MD     Critical care time: 

## 2024-10-23 NOTE — Progress Notes (Signed)
 Patient has critical lab results, hemoglobin of 3.8 a drop from 8.1, fibrinogen is 95 and platelets are 75, see chart for other results, no sign of bleeding noted, abdomen marked from previous shift, no changes noted on abdomen, E-Link notified, charge nurse aware.

## 2024-10-24 ENCOUNTER — Inpatient Hospital Stay (HOSPITAL_COMMUNITY)

## 2024-10-24 ENCOUNTER — Encounter (HOSPITAL_COMMUNITY)
Admission: AD | Disposition: A | Discharge status: Rehabilitation Facility | Payer: Self-pay | Source: Home / Self Care | Attending: Internal Medicine

## 2024-10-24 ENCOUNTER — Encounter (HOSPITAL_COMMUNITY): Payer: Self-pay | Admitting: General Surgery

## 2024-10-24 ENCOUNTER — Inpatient Hospital Stay (HOSPITAL_COMMUNITY): Admitting: Anesthesiology

## 2024-10-24 HISTORY — PX: LAPAROTOMY: SHX154

## 2024-10-24 LAB — GLUCOSE, CAPILLARY
Glucose-Capillary: 120 mg/dL — ABNORMAL HIGH (ref 70–99)
Glucose-Capillary: 107 mg/dL — ABNORMAL HIGH (ref 70–99)
Glucose-Capillary: 116 mg/dL — ABNORMAL HIGH (ref 70–99)

## 2024-10-24 LAB — COMPREHENSIVE METABOLIC PANEL WITH GFR
ALT: 269 U/L — ABNORMAL HIGH (ref 0–44)
AST: 446 U/L — ABNORMAL HIGH (ref 15–41)
Albumin: 2.6 g/dL — ABNORMAL LOW (ref 3.5–5.0)
Alkaline Phosphatase: 45 U/L (ref 38–126)
Anion gap: 12 (ref 5–15)
BUN: 22 mg/dL (ref 8–23)
CO2: 22 mmol/L (ref 22–32)
Calcium: 6.8 mg/dL — ABNORMAL LOW (ref 8.9–10.3)
Chloride: 106 mmol/L (ref 98–111)
Creatinine, Ser: 1.93 mg/dL — ABNORMAL HIGH (ref 0.44–1.00)
GFR, Estimated: 28 mL/min — ABNORMAL LOW (ref >60)
Glucose, Bld: 130 mg/dL — ABNORMAL HIGH (ref 70–99)
Potassium: 3.8 mmol/L (ref 3.5–5.1)
Sodium: 140 mmol/L (ref 135–145)
Total Bilirubin: 0.3 mg/dL (ref 0.0–1.2)
Total Protein: 3.8 g/dL — ABNORMAL LOW (ref 6.5–8.1)

## 2024-10-24 LAB — DIC (DISSEMINATED INTRAVASCULAR COAGULATION)PANEL
D-Dimer, Quant: 1.8 ug{FEU}/mL — ABNORMAL HIGH (ref 0.00–0.50)
D-Dimer, Quant: 2.21 ug{FEU}/mL — ABNORMAL HIGH (ref 0.00–0.50)
D-Dimer, Quant: 3.65 ug{FEU}/mL — ABNORMAL HIGH (ref 0.00–0.50)
D-Dimer, Quant: 4.51 ug{FEU}/mL — ABNORMAL HIGH (ref 0.00–0.50)
Fibrinogen: 95 mg/dL — CL (ref 210–475)
Fibrinogen: 268 mg/dL (ref 210–475)
Fibrinogen: 295 mg/dL (ref 210–475)
Fibrinogen: 422 mg/dL (ref 210–475)
INR: 1.9 — ABNORMAL HIGH (ref 0.8–1.2)
INR: 1.4 — ABNORMAL HIGH (ref 0.8–1.2)
INR: 1.2 (ref 0.8–1.2)
INR: 1.3 — ABNORMAL HIGH (ref 0.8–1.2)
Platelets: 75 K/uL — ABNORMAL LOW (ref 150–400)
Platelets: 26 K/uL — CL (ref 150–400)
Platelets: 34 K/uL — ABNORMAL LOW (ref 150–400)
Platelets: 38 K/uL — ABNORMAL LOW (ref 150–400)
Prothrombin Time: 22.4 s — ABNORMAL HIGH (ref 11.4–15.2)
Prothrombin Time: 18.3 s — ABNORMAL HIGH (ref 11.4–15.2)
Prothrombin Time: 16 s — ABNORMAL HIGH (ref 11.4–15.2)
Prothrombin Time: 16.5 s — ABNORMAL HIGH (ref 11.4–15.2)
Smear Review: NONE SEEN
Smear Review: NONE SEEN
Smear Review: NONE SEEN
Smear Review: NONE SEEN
aPTT: 37 s — ABNORMAL HIGH (ref 24–36)
aPTT: 35 s (ref 24–36)
aPTT: 32 s (ref 24–36)
aPTT: 34 s (ref 24–36)

## 2024-10-24 LAB — BLOOD GAS, ARTERIAL
Acid-Base Excess: 2.1 mmol/L — ABNORMAL HIGH (ref 0.0–2.0)
Bicarbonate: 27.3 mmol/L (ref 20.0–28.0)
Drawn by: 23532
FIO2: 30 %
MECHVT: 420 mL
O2 Saturation: 97.4 %
PEEP: 5 cmH2O
Patient temperature: 38.1
RATE: 14 {breaths}/min
pCO2 arterial: 46 mmHg (ref 32–48)
pH, Arterial: 7.38 (ref 7.35–7.45)
pO2, Arterial: 76 mmHg — ABNORMAL LOW (ref 83–108)

## 2024-10-24 LAB — POCT I-STAT, CHEM 8
BUN: 20 mg/dL (ref 8–23)
Calcium, Ion: 1.2 mmol/L (ref 1.15–1.40)
Chloride: 107 mmol/L (ref 98–111)
Creatinine, Ser: 1.7 mg/dL — ABNORMAL HIGH (ref 0.44–1.00)
Glucose, Bld: 186 mg/dL — ABNORMAL HIGH (ref 70–99)
HCT: 16 % — ABNORMAL LOW (ref 36.0–46.0)
Hemoglobin: 5.4 g/dL — CL (ref 12.0–15.0)
Potassium: 4.4 mmol/L (ref 3.5–5.1)
Sodium: 140 mmol/L (ref 135–145)
TCO2: 20 mmol/L — ABNORMAL LOW (ref 22–32)

## 2024-10-24 LAB — PREPARE RBC (CROSSMATCH)

## 2024-10-24 LAB — CBC
HCT: 31.4 % — ABNORMAL LOW (ref 36.0–46.0)
HCT: 32.2 % — ABNORMAL LOW (ref 36.0–46.0)
Hemoglobin: 11.4 g/dL — ABNORMAL LOW (ref 12.0–15.0)
Hemoglobin: 11.6 g/dL — ABNORMAL LOW (ref 12.0–15.0)
MCH: 30.6 pg (ref 26.0–34.0)
MCH: 30.3 pg (ref 26.0–34.0)
MCHC: 36.3 g/dL — ABNORMAL HIGH (ref 30.0–36.0)
MCHC: 36 g/dL (ref 30.0–36.0)
MCV: 84.4 fL (ref 80.0–100.0)
MCV: 84.1 fL (ref 80.0–100.0)
Platelets: 26 K/uL — CL (ref 150–400)
Platelets: 36 K/uL — ABNORMAL LOW (ref 150–400)
RBC: 3.72 MIL/uL — ABNORMAL LOW (ref 3.87–5.11)
RBC: 3.83 MIL/uL — ABNORMAL LOW (ref 3.87–5.11)
RDW: 13.8 % (ref 11.5–15.5)
RDW: 14.6 % (ref 11.5–15.5)
WBC: 7.4 K/uL (ref 4.0–10.5)
WBC: 8.8 K/uL (ref 4.0–10.5)
nRBC: 0 % (ref 0.0–0.2)
nRBC: 0 % (ref 0.0–0.2)

## 2024-10-24 LAB — HEMOGLOBIN AND HEMATOCRIT, BLOOD
HCT: 32.7 % — ABNORMAL LOW (ref 36.0–46.0)
HCT: 29.8 % — ABNORMAL LOW (ref 36.0–46.0)
Hemoglobin: 11.7 g/dL — ABNORMAL LOW (ref 12.0–15.0)
Hemoglobin: 10.8 g/dL — ABNORMAL LOW (ref 12.0–15.0)

## 2024-10-24 LAB — LACTIC ACID, PLASMA: Lactic Acid, Venous: 1.8 mmol/L (ref 0.5–1.9)

## 2024-10-24 MED ORDER — SODIUM BICARBONATE 8.4 % IV SOLN
INTRAVENOUS | Status: DC
  Filled 2024-10-24: qty 1000
  Filled 2024-10-24 (×3): qty 150

## 2024-10-24 MED ORDER — PHENYLEPHRINE HCL (PRESSORS) 10 MG/ML IV SOLN
INTRAVENOUS | Status: AC
  Filled 2024-10-24: qty 1

## 2024-10-24 MED ORDER — ORAL CARE MOUTH RINSE
15.0000 mL | OROMUCOSAL | Status: DC
  Administered 2024-10-25 – 2024-10-31 (×80): 15 mL via OROMUCOSAL

## 2024-10-24 MED ORDER — PHENYLEPHRINE 80 MCG/ML (10ML) SYRINGE FOR IV PUSH (FOR BLOOD PRESSURE SUPPORT)
PREFILLED_SYRINGE | INTRAVENOUS | Status: AC
  Filled 2024-10-24: qty 10

## 2024-10-24 MED ORDER — LIDOCAINE HCL (PF) 2 % IJ SOLN
INTRAMUSCULAR | Status: DC | PRN
  Administered 2024-10-24: 100 mg via INTRADERMAL

## 2024-10-24 MED ORDER — ROCURONIUM BROMIDE 10 MG/ML (PF) SYRINGE
PREFILLED_SYRINGE | INTRAVENOUS | Status: AC
  Filled 2024-10-24: qty 10

## 2024-10-24 MED ORDER — MIDAZOLAM HCL 2 MG/2ML IJ SOLN
INTRAMUSCULAR | Status: AC
  Filled 2024-10-24: qty 2

## 2024-10-24 MED ORDER — PROPOFOL 1000 MG/100ML IV EMUL
0.0000 ug/kg/min | INTRAVENOUS | Status: DC
  Administered 2024-10-24: 5 ug/kg/min via INTRAVENOUS
  Administered 2024-10-24: 8 ug/kg/min via INTRAVENOUS
  Administered 2024-10-25: 30.03 ug/kg/min via INTRAVENOUS
  Administered 2024-10-25: 10 ug/kg/min via INTRAVENOUS
  Filled 2024-10-24 (×4): qty 100

## 2024-10-24 MED ORDER — IOHEXOL 350 MG/ML SOLN
65.0000 mL | Freq: Once | INTRAVENOUS | Status: AC | PRN
  Administered 2024-10-24: 65 mL via INTRAVENOUS

## 2024-10-24 MED ORDER — FENTANYL BOLUS VIA INFUSION
25.0000 ug | INTRAVENOUS | Status: DC | PRN
  Administered 2024-10-24 (×2): 25 ug via INTRAVENOUS
  Administered 2024-10-25: 50 ug via INTRAVENOUS
  Administered 2024-10-25: 25 ug via INTRAVENOUS
  Administered 2024-10-25: 50 ug via INTRAVENOUS
  Administered 2024-10-26: 100 ug via INTRAVENOUS

## 2024-10-24 MED ORDER — 0.9 % SODIUM CHLORIDE (POUR BTL) OPTIME
TOPICAL | Status: DC | PRN
  Administered 2024-10-24: 2000 mL

## 2024-10-24 MED ORDER — LIDOCAINE HCL (PF) 2 % IJ SOLN
INTRAMUSCULAR | Status: AC
  Filled 2024-10-24: qty 5

## 2024-10-24 MED ORDER — MAGNESIUM SULFATE 4 GM/100ML IV SOLN
4.0000 g | Freq: Once | INTRAVENOUS | Status: AC
  Administered 2024-10-24: 4 g via INTRAVENOUS
  Filled 2024-10-24: qty 100

## 2024-10-24 MED ORDER — VASOPRESSIN 20 UNITS/100 ML INFUSION FOR SHOCK
0.0400 [IU]/min | INTRAVENOUS | Status: DC
  Administered 2024-10-24: 0.04 [IU]/min via INTRAVENOUS
  Filled 2024-10-24: qty 100

## 2024-10-24 MED ORDER — ORAL CARE MOUTH RINSE: 15.0000 mL | OROMUCOSAL | Status: DC | PRN

## 2024-10-24 MED ORDER — FENTANYL CITRATE (PF) 100 MCG/2ML IJ SOLN
INTRAMUSCULAR | Status: DC | PRN
  Administered 2024-10-24 (×2): 50 ug via INTRAVENOUS

## 2024-10-24 MED ORDER — FENTANYL 2500MCG IN NS 250ML (10MCG/ML) PREMIX INFUSION
0.0000 ug/h | INTRAVENOUS | Status: DC
  Administered 2024-10-24: 25 ug/h via INTRAVENOUS
  Filled 2024-10-24: qty 250

## 2024-10-24 MED ORDER — SUCCINYLCHOLINE CHLORIDE 200 MG/10ML IV SOSY
PREFILLED_SYRINGE | INTRAVENOUS | Status: DC | PRN
  Administered 2024-10-24: 100 mg via INTRAVENOUS

## 2024-10-24 MED ORDER — NOREPINEPHRINE 16 MG/250ML-% IV SOLN
0.0000 ug/min | INTRAVENOUS | Status: DC
  Administered 2024-10-24: 14 ug/min via INTRAVENOUS
  Administered 2024-10-24: 3 ug/min via INTRAVENOUS
  Filled 2024-10-24: qty 250

## 2024-10-24 MED ORDER — FENTANYL CITRATE (PF) 50 MCG/ML IJ SOSY: 25.0000 ug | PREFILLED_SYRINGE | Freq: Once | INTRAMUSCULAR | Status: DC

## 2024-10-24 MED ORDER — FAMOTIDINE 20 MG PO TABS
20.0000 mg | ORAL_TABLET | Freq: Two times a day (BID) | ORAL | Status: DC
  Administered 2024-10-24 – 2024-10-25 (×3): 20 mg
  Filled 2024-10-24 (×3): qty 1

## 2024-10-24 MED ORDER — VASOPRESSIN 20 UNIT/ML IV SOLN
INTRAVENOUS | Status: AC
  Filled 2024-10-24: qty 1

## 2024-10-24 MED ORDER — SUCCINYLCHOLINE CHLORIDE 200 MG/10ML IV SOSY
PREFILLED_SYRINGE | INTRAVENOUS | Status: AC
  Filled 2024-10-24: qty 10

## 2024-10-24 MED ORDER — SODIUM BICARBONATE 8.4 % IV SOLN
150.0000 meq | Freq: Once | INTRAVENOUS | Status: AC
  Administered 2024-10-24: 150 meq via INTRAVENOUS
  Filled 2024-10-24: qty 100

## 2024-10-24 MED ORDER — ROCURONIUM BROMIDE 100 MG/10ML IV SOLN
INTRAVENOUS | Status: DC | PRN
  Administered 2024-10-24: 50 mg via INTRAVENOUS

## 2024-10-24 MED ORDER — SODIUM CHLORIDE 0.9 % IV SOLN: INTRAVENOUS | Status: DC | PRN

## 2024-10-24 MED ORDER — FENTANYL CITRATE (PF) 100 MCG/2ML IJ SOLN
INTRAMUSCULAR | Status: AC
  Filled 2024-10-24: qty 2

## 2024-10-24 MED ORDER — PROPOFOL 10 MG/ML IV BOLUS
INTRAVENOUS | Status: AC
  Filled 2024-10-24: qty 20

## 2024-10-24 MED ORDER — SODIUM CHLORIDE 0.9 % IV SOLN: INTRAVENOUS | Status: AC | PRN

## 2024-10-24 MED ORDER — ACETAMINOPHEN 500 MG PO TABS
1000.0000 mg | ORAL_TABLET | Freq: Four times a day (QID) | ORAL | Status: DC | PRN
  Administered 2024-10-24 – 2024-11-21 (×13): 1000 mg
  Filled 2024-10-24 (×10): qty 2

## 2024-10-24 MED ORDER — PROPOFOL 10 MG/ML IV BOLUS
INTRAVENOUS | Status: DC | PRN
  Administered 2024-10-24: 20 mg via INTRAVENOUS

## 2024-10-24 MED ORDER — GABAPENTIN 250 MG/5ML PO SOLN
600.0000 mg | Freq: Three times a day (TID) | ORAL | Status: DC
  Administered 2024-10-24 – 2024-10-25 (×4): 600 mg
  Filled 2024-10-24 (×6): qty 12

## 2024-10-24 MED ORDER — MIDAZOLAM HCL (PF) 2 MG/2ML IJ SOLN
INTRAMUSCULAR | Status: DC | PRN
  Administered 2024-10-24 (×2): 1 mg via INTRAVENOUS

## 2024-10-24 SURGICAL SUPPLY — 40 items
BAG COUNTER SPONGE SURGICOUNT (BAG) IMPLANT
BLADE EXTENDED COATED 6.5IN (ELECTRODE) IMPLANT
CANISTER WOUND CARE 500ML ATS (WOUND CARE) IMPLANT
CHLORAPREP W/TINT 26 (MISCELLANEOUS) ×1 IMPLANT
COVER SURGICAL LIGHT HANDLE (MISCELLANEOUS) IMPLANT
DRAIN CHANNEL 19F RND (DRAIN) IMPLANT
DRAPE LAPAROSCOPIC ABDOMINAL (DRAPES) ×1 IMPLANT
DRAPE SHEET LG 3/4 BI-LAMINATE (DRAPES) IMPLANT
DRSG OPSITE POSTOP 4X10 (GAUZE/BANDAGES/DRESSINGS) IMPLANT
DRSG OPSITE POSTOP 4X6 (GAUZE/BANDAGES/DRESSINGS) IMPLANT
DRSG OPSITE POSTOP 4X8 (GAUZE/BANDAGES/DRESSINGS) IMPLANT
ELECT REM PT RETURN 15FT ADLT (MISCELLANEOUS) ×1 IMPLANT
EVACUATOR SILICONE 100CC (DRAIN) IMPLANT
GAUZE SPONGE 4X4 12PLY STRL (GAUZE/BANDAGES/DRESSINGS) IMPLANT
GLOVE BIO SURGEON STRL SZ 6.5 (GLOVE) ×2 IMPLANT
GLOVE BIOGEL PI IND STRL 7.0 (GLOVE) IMPLANT
GLOVE BIOGEL PI IND STRL 7.5 (GLOVE) IMPLANT
GLOVE BIOGEL PI IND STRL 8 (GLOVE) IMPLANT
GLOVE INDICATOR 6.5 STRL GRN (GLOVE) ×3 IMPLANT
GOWN STRL REUS W/ TWL XL LVL3 (GOWN DISPOSABLE) ×3 IMPLANT
GOWN STRL REUS W/TWL XL LVL3 (GOWN DISPOSABLE) IMPLANT
HANDLE SUCTION POOLE (INSTRUMENTS) IMPLANT
KIT TURNOVER KIT A (KITS) ×1 IMPLANT
LEGGING LITHOTOMY PAIR STRL (DRAPES) IMPLANT
PACK COLON (CUSTOM PROCEDURE TRAY) ×1 IMPLANT
PENCIL SMOKE EVACUATOR (MISCELLANEOUS) IMPLANT
RETRACTOR WND ALEXIS 18 MED (MISCELLANEOUS) IMPLANT
RETRACTOR WND ALEXIS 25 LRG (MISCELLANEOUS) IMPLANT
SPONGE ABDOMINAL VAC ABTHERA (MISCELLANEOUS) IMPLANT
STAPLER SKIN PROX 35W (STAPLE) ×1 IMPLANT
SUT ETHILON 3 0 PS 1 (SUTURE) IMPLANT
SUT NOVA 1 T20/GS 25DT (SUTURE) ×2 IMPLANT
SUT SILK 2 0 SH CR/8 (SUTURE) ×1 IMPLANT
SUT SILK 2-0 18XBRD TIE 12 (SUTURE) ×1 IMPLANT
SUT SILK 3 0 SH CR/8 (SUTURE) ×1 IMPLANT
SUT SILK 3-0 18XBRD TIE 12 (SUTURE) ×1 IMPLANT
SUT VIC AB 2-0 SH 18 (SUTURE) ×1 IMPLANT
TOWEL OR DSP ST BLU DLX 10/PK (DISPOSABLE) IMPLANT
TRAY FOLEY MTR SLVR 16FR STAT (SET/KITS/TRAYS/PACK) ×1 IMPLANT
TUBING CONNECTING 10 (TUBING) ×2 IMPLANT

## 2024-10-24 NOTE — Progress Notes (Signed)
 Initial Nutrition Assessment  DOCUMENTATION CODES:   Non-severe (moderate) malnutrition in context of chronic illness  INTERVENTION:  - NPO. Plan to return to OR tomorrow.   - If patient remains intubated after abdominal closure tomorrow and able to initiate tube feeds, would recommend: Vital 1.2 at 50 ml/h (1200 ml per day) *Would recommend starting at 75mL/hr and advancing by 10mL Q12H Provides 1440 kcal, 90 gm protein, 973 ml free water  daily  - Otherwise, will monitor for diet advancement.   NUTRITION DIAGNOSIS:   Moderate Malnutrition related to chronic illness as evidenced by moderate muscle depletion, percent weight loss (15% in 6 months).  GOAL:   Patient will meet greater than or equal to 90% of their needs  MONITOR:   Vent status, Weight trends  REASON FOR ASSESSMENT:   Ventilator    ASSESSMENT:   68 y.o. female with PMH GERD. IBS, CAD, HTN who presented for elective hernia repair.   12/3 s/p hernia repair; became hypotensive after surgery requiring vasopressors, transferred to the ICU 12/4 returned to OR for ex-lap, abdominal packing and wound vac application; NGT placed; returned to ICU intubated  Patient is currently intubated on ventilator support MV: 6.7 L/min Temp (24hrs), Avg:98.1 F (36.7 C), Min:94.2 F (34.6 C), Max:100 F (37.8 C)   No family at bedside at time of visit.  Per EMR, patient had a possible 17# or 15% weight loss from April to September. This is significant for a time frame of 6 months. Weight appears stable since September.   Per CCM, concern for DIC. Patient now off pressors. Surgery planning to return to the OR tomorrow for reexploration and removal of packing and wound vac. Will monitor for nutrition plans.    Medications reviewed and include:  Fentanyl  Propofol  @ 1.51mL/hr (provides 35 kcals over 24 hours)  Labs reviewed:  Creatinine 1.93   NUTRITION - FOCUSED PHYSICAL EXAM:  Flowsheet Row Most Recent Value   Orbital Region Mild depletion  Upper Arm Region No depletion  Thoracic and Lumbar Region No depletion  Buccal Region Unable to assess  Temple Region Moderate depletion  Clavicle Bone Region Mild depletion  Clavicle and Acromion Bone Region Moderate depletion  Scapular Bone Region Unable to assess  Dorsal Hand No depletion  Patellar Region No depletion  Anterior Thigh Region No depletion  Posterior Calf Region No depletion  Edema (RD Assessment) None  Hair Reviewed  Eyes Unable to assess  Mouth Unable to assess  Skin Reviewed  Nails Reviewed    Diet Order:   Diet Order             Diet NPO time specified  Diet effective now           Diet - low sodium heart healthy                   EDUCATION NEEDS:  Not appropriate for education at this time  Skin:  Skin Assessment: Skin Integrity Issues: Skin Integrity Issues:: Incisions Incisions: Surgical Abdomen  Last BM:  PTA  Height:  Ht Readings from Last 1 Encounters:  10/23/24 5' 3 (1.6 m)   Weight:  Wt Readings from Last 1 Encounters:  10/23/24 44.4 kg   Ideal Body Weight:  52.27 kg  BMI:  Body mass index is 17.34 kg/m.  Estimated Nutritional Needs:  Kcal:  1400-1550 kcals Protein:  75-90 grams Fluid:  >/= 1.5L    Trude Ned RD, LDN Contact via Secure Chat.

## 2024-10-24 NOTE — Anesthesia Postprocedure Evaluation (Signed)
 Anesthesia Post Note  Patient: Erin [name]  Procedure(s) Performed: EXPLORATORY LAPAROTOMY, ABDOMINAL PACKING AND WOUND VAC APPLICATION (Abdomen)     Patient location during evaluation: ICU Anesthesia Type: General Level of consciousness: sedated and patient remains intubated per anesthesia plan Pain management: pain level controlled Vital Signs Assessment: post-procedure vital signs reviewed and stable Respiratory status: patient on ventilator - see flowsheet for VS and patient remains intubated per anesthesia plan Cardiovascular status: stable Anesthetic complications: no   No notable events documented.  Last Vitals:  Vitals:   10/24/24 0930 10/24/24 1000  BP:    Pulse: 76 79  Resp: 15 14  Temp: 37.5 C 37.6 C  SpO2: 98% 98%    Last Pain:  Vitals:   10/24/24 0800  TempSrc: Rectal  PainSc:                  Erin [name]

## 2024-10-24 NOTE — Progress Notes (Signed)
 eLink Physician-Brief Progress Note Patient Name: Erin [name] DOB: 06/18/1956 MRN: [id number]   Date of Service  10/24/2024  HPI/Events of Note  Ventilator settings do not match the orders, reviewed with RT.  No evidence of respiratory distress on exam  eICU Interventions  Update the order to reflect current ventilator settings.  Will obtain ABG     Intervention Category Minor Interventions: Routine modifications to care plan (e.g. PRN medications for pain, fever)  Erin [name] 10/24/2024, 9:32 PM

## 2024-10-24 NOTE — Progress Notes (Signed)
 Cross covering ICU physician  Called by radiology after repeatedly low hgb and continued transfusion. Noted active arterial bleed. Have paged surgery on call at this time awaiting call back.   Bedside RN updated and also sending surgery message to ensure delivery.

## 2024-10-24 NOTE — Progress Notes (Signed)
 NAME:  Erin [name], MRN:  [id number], DOB:  1956-07-15, LOS: 1 ADMISSION DATE:  10/23/2024, CONSULTATION DATE:  10/23/24 REFERRING MD:  surgery CHIEF COMPLAINT:  hypotension   History of Present Illness:  68 yo female presented for elective hernia repair. Post operatively pt remained hypotensive on pressor. Pt is laying in bed calling out in pain of legs and abdomen. Feels that she needs to keep her legs bent. Appears very pale in skin and mucosal membranes. Pt is oriented but has requested all questions are directed to her husband at this time. Pt does endorse dizziness, despite being supine and feeling weak and cold. Otherwise ROS negative or pt is not able to verbalize other symptoms.    Husband on the phone states that leg pain is chronic and typically has been 2/2 low mag and potassium for which she is reportedly on supplementation for. Husband also states that she recently (10/14) had mesenteric artery stenting and he is concerned that this is contributing to her presentation post operatively from the hernia repair. I have discussed with him plan for cvc, repeat labs and likely cta to eval for active bleed. He agrees with plan and consents to further blood product transfusion.    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  Significant Hospital Events: Including procedures, antibiotic start and stop dates in addition to other pertinent events   12/3 admitted to ICU postop 12/3-post hernia repair 12/4 taken back to the OR for hemoperitoneum-active bleed was not identified, abdomen packed with wound VAC in place  Interim History / Subjective:  Receiving blood products Unresponsive  Objective    Blood pressure (!) 137/59, pulse 70, temperature (!) 94.7 F (34.8 C), temperature source Rectal, resp. rate  14, height 5' 3 (1.6 m), weight 44.4 kg, SpO2 100%.    Vent Mode: PRVC FiO2 (%):  [32 %-100 %] 50 % Set Rate:  [14 bmp] 14 bmp Vt Set:  [420 mL-480 mL] 420 mL PEEP:  [5 cmH20] 5 cmH20 Plateau Pressure:  [16 cmH20-17 cmH20] 16 cmH20   Intake/Output Summary (Last 24 hours) at 10/24/2024 0736 Last data filed at 10/24/2024 0700 Gross per 24 hour  Intake 9280.58 ml  Output 352 ml  Net 8928.58 ml   Filed Weights   10/23/24 0658 10/23/24 0709  Weight: 44.4 kg 44.4 kg   On propofol  5 mcg/kg/min Fentanyl  25 mcg/h Vasopressin-0.04 units/min Norepinephrine-discontinued Sodium bicarb Examination: General: Middle-age, does not appear to be in distress HENT: Moist oral mucosa with endotracheal tube in place Lungs: Fair air entry bilaterally Cardiovascular: S1-S2 appreciated with no murmur Abdomen: Postsurgical, wound VAC in place Extremities: Warm and dry, no edema Neuro: Unresponsive GU:   I reviewed last 24 h vitals and pain scores, last 48 h intake and output, last 24 h labs and trends, and last 24 h imaging results.  Received transfusion Cryoprecipitate-2 pools FFP-2 units Blood-3 units  Bicarb of 14, BUN 22, creatinine 1.81, calcium  5.7, albumin of 2 AST of 43, ALT of 26 WBC 13.4, hemoglobin prior to transfusion of 3.8  D-dimer 1.8, fibrinogen of 95 INR of 1.9, APTT of 37  I's/O-9L positive  Resolved problem list   Assessment and Plan   Hemorrhagic shock - Has received blood products - Recheck labs pending  DIC - Low fibrinogen, elevated D-dimer, low platelets, no  schistocytes seen - D-dimer expected to be much higher - Will continue to monitor - Will repeat DIC panel after about 6 hours  Post exploratory laparotomy - Surgery continues to follow - Abdominal packing in place with a wound VAC in place - Appears to be comfortable, pain adequately controlled  Hypotension - On pressors -being titrated off  Metabolic acidosis - Continue bicarb  Acute kidney  injury - Maintain renal perfusion - Avoid nephrotoxic medications  Closely follow labs  Labs   CBC: Recent Labs  Lab 10/18/24 1205 10/23/24 1436 10/23/24 1636 10/23/24 2233 10/23/24 2234  WBC 8.1 10.2 11.7* 13.4*  --   NEUTROABS  --   --  10.1*  --   --   HGB 10.5* 4.3* 8.1* 3.8*  --   HCT 32.8* 13.8* 25.2* 11.8*  --   MCV 95.6 100.7* 89.0 90.1  --   PLT 295 142* 117* 75* 75*    Basic Metabolic Panel: Recent Labs  Lab 10/18/24 1205 10/23/24 2233  NA 137 139  K 3.6 5.3*  CL 103 115*  CO2 22 14*  GLUCOSE 90 203*  BUN 25* 22  CREATININE 1.47* 1.81*  CALCIUM  9.0 5.7*  MG  --  1.6*   GFR: Estimated Creatinine Clearance: 20.9 mL/min (A) (by C-G formula based on SCr of 1.81 mg/dL (H)). Recent Labs  Lab 10/18/24 1205 10/23/24 1436 10/23/24 1636 10/23/24 2233  WBC 8.1 10.2 11.7* 13.4*  LATICACIDVEN  --   --   --  4.7*    Liver Function Tests: Recent Labs  Lab 10/23/24 2233  AST 43*  ALT 26  ALKPHOS 32*  BILITOT <0.2  PROT <3.0*  ALBUMIN 2.0*   No results for input(s): LIPASE, AMYLASE in the last 168 hours. No results for input(s): AMMONIA in the last 168 hours.  ABG    Component Value Date/Time   PHART 7.439 08/14/2017 2217   PCO2ART 32.8 08/14/2017 2217   PO2ART 409.0 (H) 08/14/2017 2217   HCO3 22.5 08/14/2017 2217   TCO2 22 09/03/2024 0835   ACIDBASEDEF 2.0 08/14/2017 2217   O2SAT 100.0 08/14/2017 2217     Coagulation Profile: Recent Labs  Lab 10/23/24 2234  INR 1.9*    Cardiac Enzymes: No results for input(s): CKTOTAL, CKMB, CKMBINDEX, TROPONINI in the last 168 hours.  HbA1C: Hgb A1c MFr Bld  Date/Time Value Ref Range Status  10/18/2024 12:05 PM 4.9 4.8 - 5.6 % Final    Comment:    (NOTE)         Prediabetes: 5.7 - 6.4         Diabetes: >6.4         Glycemic control for adults with diabetes: <7.0   12/10/2021 03:32 PM 5.2 <5.7 % of total Hgb Final    Comment:    For the purpose of screening for the presence  of diabetes: . <5.7%       Consistent with the absence of diabetes 5.7-6.4%    Consistent with increased risk for diabetes             (prediabetes) > or =6.5%  Consistent with diabetes . This assay result is consistent with a decreased risk of diabetes. . Currently, no consensus exists regarding use of hemoglobin A1c for diagnosis of diabetes in children. . According to American Diabetes Association (ADA) guidelines, hemoglobin A1c <7.0% represents optimal control in non-pregnant diabetic patients. Different metrics may apply to specific patient populations.  Standards of Medical Care in Diabetes(ADA). 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), Anxiety, Aortoiliac occlusive disease (HCC) (08/14/2017), Arthritis, B12 deficiency anemia (09/14/2015), Blood transfusion without reported diagnosis, BPPV (benign paroxysmal positional vertigo) (07/08/2015), Cardiomyopathy (HCC) (11/03/2017), Chronic back pain, Chronic daily headache (03/29/2013), Closed right hip fracture (HCC) (09/10/2015), Coronary artery disease involving native coronary artery of native heart without angina pectoris (10/13/2017), Depression, Essential (hemorrhagic) thrombocythemia (HCC) (09/06/2018), Essential hypertension (09/25/2023), Family history of early CAD (04/08/2016), GERD (gastroesophageal reflux disease), History of hiatal hernia, Hot flashes, Hyperlipidemia, Hyperlipidemia associated with type 2 diabetes mellitus (HCC) (05/11/2017), Hypothyroidism (09/10/2015), IBS (irritable bowel syndrome) (03/29/2013), Neck pain (07/08/2015), 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   Surgical History:   Past Surgical History:  Procedure Laterality Date   ABDOMINAL AORTOGRAM N/A 08/02/2017   Procedure: ABDOMINAL AORTOGRAM;  Surgeon: Darron Deatrice LABOR, MD;  Location: [facility];  Service: Cardiovascular;  Laterality: N/A;   ABDOMINAL AORTOGRAM W/LOWER EXTREMITY Bilateral 09/03/2024   Procedure: ABDOMINAL AORTOGRAM W/LOWER EXTREMITY;  Surgeon: Serene Gaile ORN, MD;  Location: [facility];  Service: Cardiovascular;  Laterality: Bilateral;   AORTA - BILATERAL FEMORAL ARTERY BYPASS GRAFT N/A 08/14/2017   Procedure: AORTA BIFEMORAL BYPASS GRAFT;  Surgeon: Oris Krystal FALCON, MD;  Location: [facility] OR;  Service: Vascular;  Laterality: N/A;   COLONOSCOPY     COLONOSCOPY N/A 08/29/2024   Procedure: COLONOSCOPY;  Surgeon: Wilhelmenia Aloha Raddle., MD;  Location: [facility];  Service: Gastroenterology;  Laterality: N/A;   CORONARY STENT INTERVENTION N/A 10/13/2017   Procedure: CORONARY STENT INTERVENTION;  Surgeon: Mady Bruckner, MD;  Location: [facility];  Service: Cardiovascular;  Laterality: N/A;   dental implant     ECTOPIC PREGNANCY SURGERY     EMBOLECTOMY N/A 08/14/2017   Procedure: EMBOLECTOMY FEMORAL;  Surgeon: Oris Krystal FALCON, MD;  Location: [facility] OR;  Service: Vascular;  Laterality: N/A;   FEMORAL-POPLITEAL BYPASS GRAFT Right 08/14/2017   Procedure: Right Femoral to Above Knee Popliteal Bypass Graft using Non-Reversed Greater Saphenous Vein Graft from Right Leg;  Surgeon: Oris Krystal FALCON, MD;  Location: [facility] OR;  Service: Vascular;  Laterality: Right;   FLEXIBLE SIGMOIDOSCOPY N/A 08/14/2013   Procedure: FLEXIBLE SIGMOIDOSCOPY;  Surgeon: Gwendlyn ONEIDA Buddy, MD;  Location: [facility];  Service: Endoscopy;  Laterality: N/A;   LEFT HEART CATH AND CORONARY ANGIOGRAPHY N/A 10/13/2017   Procedure: LEFT HEART CATH AND CORONARY ANGIOGRAPHY;  Surgeon: Mady Bruckner, MD;  Location: [facility];  Service: Cardiovascular;   Laterality: N/A;   LOWER EXTREMITY ANGIOGRAPHY Bilateral 08/02/2017   Procedure: Lower Extremity Angiography;  Surgeon: Darron Deatrice LABOR, MD;  Location: [facility];  Service: Cardiovascular;  Laterality: Bilateral;   LOWER EXTREMITY INTERVENTION N/A 09/03/2024   Procedure: LOWER EXTREMITY INTERVENTION;  Surgeon: Serene Gaile ORN, MD;  Location: [facility];  Service: Cardiovascular;  Laterality: N/A;   MULTIPLE TOOTH EXTRACTIONS     PERIPHERAL VASCULAR INTERVENTION  05/22/2018   Procedure: PERIPHERAL VASCULAR INTERVENTION;  Surgeon: Serene Gaile ORN, MD;  Location: [facility];  Service: Cardiovascular;;  SMA and Celiac   PILONIDAL CYST EXCISION     POLYPECTOMY     THROMBECTOMY  FEMORAL ARTERY Right 08/14/2017   Procedure: THROMBECTOMY FEMORAL ARTERY;  Surgeon: Oris Krystal FALCON, MD;  Location[facility] OR;  Service: Vascular;  Laterality: Right;   TONSILLECTOMY     TOTAL HIP ARTHROPLASTY  09/11/2015   Procedure: TOTAL HIP ARTHROPLASTY;  Surgeon: Evalene JONETTA Chancy, MD;  Location: [facility] OR;  Service: Orthopedics;;   ULTRASOUND GUIDANCE FOR VASCULAR ACCESS  10/13/2017   Procedure: Ultrasound Guidance For Vascular Access;  Surgeon: Mady Bruckner, MD;  Location: [facility];  Service: Cardiovascular;;   VISCERAL ANGIOGRAPHY N/A 03/03/2020   Procedure: MESTENRIC ANGIOGRAPHY;  Surgeon: Serene Gaile ORN, MD;  Location: [facility];  Service: Cardiovascular;  Laterality: N/A;   VISCERAL ANGIOGRAPHY N/A 09/03/2024   Procedure: VISCERAL ANGIOGRAPHY;  Surgeon: Serene Gaile ORN, MD;  Location: [facility];  Service: Cardiovascular;  Laterality: N/A;   VISCERAL ARTERY INTERVENTION N/A 09/03/2024   Procedure: VISCERAL ARTERY INTERVENTION;  Surgeon: Serene Gaile ORN, MD;  Location: [facility];  Service: Cardiovascular;  Laterality: N/A;     Social History:   reports that she quit smoking about 10 years ago. Her smoking use included cigarettes. She has never used  smokeless tobacco. She reports that she does not drink alcohol and does not use drugs.   Family History:  Her family history includes Arthritis in her mother; Dementia in her mother; Heart attack in her father, maternal grandfather, maternal uncle, and mother; Heart disease in her father and mother; Hyperlipidemia in her father and mother; Hypertension in her father and mother; Irritable bowel syndrome in her mother; Lung cancer (age of onset: 97) in her brother; Prostate cancer in her father; Stroke (age of onset: 53) in her mother; Stroke (age of onset: 48) in her father; Thyroid  disease in her father and mother. There is no history of Colon cancer, Rectal cancer, Stomach cancer, or Esophageal cancer.   Allergies Allergies  Allergen Reactions   Latex Itching    Must use paper tape   Amitiza  [Lubiprostone ] Diarrhea    Uncontrollable diarrhea   Nortriptyline  Other (See Comments)    Stomach distention   Sulfa Antibiotics Nausea And Vomiting and Other (See Comments)    GI distress/pain   Tramadol  Itching and Rash   Atorvastatin  Nausea And Vomiting   Claritin [Loratadine] Other (See Comments)    Hot flashes     The patient is critically ill with multiple organ systems failure and requires high complexity decision making for assessment and support, frequent evaluation and titration of therapies, application of advanced monitoring technologies and extensive interpretation of multiple databases. Critical Care Time devoted to patient care services described in this note independent of APP/resident time (if applicable)  is 32 minutes.   Jennet Epley MD  Pulmonary Critical Care Personal pager: See Amion If unanswered, please page CCM On-call: #[phone]

## 2024-10-24 NOTE — Progress Notes (Signed)
 * Day of Surgery *   Subjective/Chief Complaint: Overnight event noted Intubated this AM Off pressors   Objective: Vital signs in last 24 hours: Temp:  [94.2 F (34.6 C)-98.2 F (36.8 C)] 94.7 F (34.8 C) (12/04 0400) Pulse Rate:  [44-117] 70 (12/04 0700) Resp:  [0-77] 14 (12/04 0700) BP: (58-160)/(31-89) 137/59 (12/04 0200) SpO2:  [76 %-100 %] 100 % (12/04 0700) Arterial Line BP: (135-172)/(58-69) 140/59 (12/04 0700) FiO2 (%):  [32 %-100 %] 50 % (12/04 0727) Last BM Date :  (PTA)  Intake/Output from previous day: 12/03 0701 - 12/04 0700 In: 9280.6 [P.O.:240; I.V.:4607.6; Blood:1955; IV Piggyback:2478] Out: 352 [Urine:350; Blood:2] Intake/Output this shift: No intake/output data recorded.  General appearance: sedated Cardio: regular rate and rhythm, S1, S2 normal, no murmur, click, rub or gallop GI: abthera vac in place, sanguinous output in vac  Lab Results:  Recent Labs    10/23/24 1636 10/23/24 2233 10/23/24 2234  WBC 11.7* 13.4*  --   HGB 8.1* 3.8*  --   HCT 25.2* 11.8*  --   PLT 117* 75* 75*   BMET Recent Labs    10/23/24 2233  NA 139  K 5.3*  CL 115*  CO2 14*  GLUCOSE 203*  BUN 22  CREATININE 1.81*  CALCIUM  5.7*   PT/INR Recent Labs    10/23/24 2234  LABPROT 22.4*  INR 1.9*   ABG No results for input(s): PHART, HCO3 in the last 72 hours.  Invalid input(s): PCO2, PO2  Studies/Results: DG CHEST PORT 1 VIEW Result Date: 10/24/2024 EXAM: 1 VIEW(S) XRAY OF THE CHEST 10/24/2024 06:04:00 AM COMPARISON: 10/23/2024 CLINICAL HISTORY: Intubation of airway performed without difficulty FINDINGS: LINES, TUBES AND DEVICES: Endotracheal tube in place with tip 2.2 cm above the carina. Enteric tube in place with tip in the region of the gastroesophageal junction. Left internal jugular central venous catheter in place with tip in the superior vena cava. LUNGS AND PLEURA: Small bilateral pleural effusions. Hazy bibasilar airspace opacities, possibly  representing atelectasis or pneumonia. Prominent bilateral interstitial opacities, possibly representing mild pulmonary edema or atelectasis. No pneumothorax. HEART AND MEDIASTINUM: No acute abnormality of the cardiac and mediastinal silhouettes. BONES AND SOFT TISSUES: No acute osseous abnormality. Gaseous distention of stomach. IMPRESSION: 1. Enteric tube is under-advanced with tip at the level of the ge junction. Recommend advancement. 2. Small bilateral pleural effusions, new from prior exam. 3. Prominent bilateral interstitial opacities, which are favored to reflect pulmonary edema. 4. New hazy bibasilar airspace opacities, which may reflect atelectasis or pneumonia. Electronically signed by: Waddell Calk MD 10/24/2024 06:21 AM EST RP Workstation: [id number]   CT Angio Abd/Pel w/ and/or w/o Result Date: 10/24/2024 EXAM: CTA ABDOMEN AND PELVIS WITHOUT AND WITH CONTRAST 10/24/2024 12:45:40 AM TECHNIQUE: CTA images of the abdomen and pelvis without and with intravenous contrast. Three-dimensional MIP/volume rendered formations were performed. Automated exposure control, iterative reconstruction, and/or weight based adjustment of the mA/kV was utilized to reduce the radiation dose to as low as reasonably achievable. COMPARISON: 06/18/2024 CLINICAL HISTORY: Retroperitoneal bleed suspected. Status post abdominal wall hernia repair on 10/23/2024 and recent renal artery stent for stenosis. FINDINGS: VASCULATURE: Low-density blood pool compatible with anemia. AORTA: No acute finding. No abdominal aortic aneurysm. No dissection. CELIAC TRUNK: The celiac axis stent is patent. SUPERIOR MESENTERIC ARTERY: The SMA stent is patent. There is severe narrowing of the SMA immediately distal to the stent. INFERIOR MESENTERIC ARTERY: The IMA is not well visualized. RENAL ARTERIES: Diminutive right renal artery. Minimal enhancement of  the left kidney with non-opacification of the left renal artery which appears occluded near the  origin (series 13 image 94). ILIAC ARTERIES: No acute finding. Patent aortobifemoral bypass grafts. LIVER: The liver is unremarkable. GALLBLADDER AND BILE DUCTS: Gallbladder wall thickening is likely reactive and/or due to 3rd spacing of fluid. No biliary ductal dilatation. SPLEEN: The spleen is unremarkable. PANCREAS: The pancreas is unremarkable. ADRENAL GLANDS: Bilateral adrenal glands demonstrate no acute abnormality. KIDNEYS, URETERS AND BLADDER: There is a peripheral wedge-shaped area of non-enhancement in the superior pole of the right kidney compatible with infarct. Minimal enhancement of the left kidney with non-opacification of the left renal artery which appears occluded near the origin (series 13 image 94). No stones in the kidneys or ureters. No hydronephrosis. No perinephric or periureteral stranding. Foley catheter and gas in the decompressed bladder. GI AND BOWEL: Patulous lower esophagus containing fluid. Distended stomach with air-fluid level. Wall thickening and mucosal hyperenhancement of the gastric antrum and duodenum. Wall thickening and mucosal hyperenhancement of the duodenum, jejunum as well as the cecum and ascending and transverse colon presumably due to reactive inflammation from adjacent hemorrhage. REPRODUCTIVE: No acute abnormality. PERITONEUM AND RETROPERITONEUM: Large right intraperitoneal hematoma measuring 14.9 x 8.0 x 13.2 cm. There is active bleeding from the right epigastric artery (circa series 7 image 115 and series 9 image 48). Hemorrhage tracks inferiorly into the pelvis. Tiny amount of free intraperitoneal gas in the anterior peritoneum is likely postoperative. LUNG BASE: Bilateral pleural effusions and compressive atelectasis. LYMPH NODES: No lymphadenopathy. BONES AND SOFT TISSUES: Right hip arthroplasty. Extensive subcutaneous edema in the abdominal wall. Hemorrhage extends through a defect in the anterior peritoneum into the subcutaneous fat of the ventral abdominal  wall (series 7, images 107-113). Subcutaneous emphysema is presumed postoperative. No acute abnormality of the bones. IMPRESSION: 1. Large right intraperitoneal hematoma with active bleeding from the right epigastric artery. Large volume hemorrhage throughout the abdomen and pelvis. 2. 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 3. Diminutive right renal artery with infarct in the upper pole of the right kidney. 4. Severe narrowing of the SMA immediately distal to the stent, which is patent. 5. Reactive inflammation of the small bowel colon. 6. Low-density blood pool on noncontrast images compatible with anemia. 7. Body wall anasarca. Critical value/emergent results were called by telephone at the time of interpretation on 12 / 4 / 25 at 1:10 pm to Dr. DOROTHA Na, who verbally acknowledged these results. Electronically signed by: Norman Gatlin MD 10/24/2024 01:13 AM EST RP Workstation: [id number]   DG CHEST PORT 1 VIEW Result Date: 10/23/2024 EXAM: 1 VIEW(S) XRAY OF THE CHEST 10/23/2024 10:50:52 PM COMPARISON: None available. CLINICAL HISTORY: Encounter for central line placement [id number] FINDINGS: LINES, TUBES AND DEVICES: Left internal jugular central venous catheter in place with tip overlying the superior caval atrial junction. LUNGS AND PLEURA: Benign calcified granuloma in right base. No pleural effusion. No pneumothorax. HEART AND MEDIASTINUM: Calcified aorta. No acute abnormality of the cardiac silhouette. BONES AND SOFT TISSUES: No acute osseous abnormality. UPPER ABDOMEN: Gaseous distention of stomach. IMPRESSION: 1. Left internal jugular central venous catheter in place with tip overlying the superior caval atrial junction. Electronically signed by: Norman Gatlin MD 10/23/2024 10:59 PM EST RP Workstation: [id number]    Anti-infectives: Anti-infectives (From admission, onward)    Start     Dose/Rate Route Frequency Ordered Stop   10/23/24 0700   ceFAZolin  (ANCEF ) IVPB 2g/100 mL premix  2 g 200 mL/hr over 30 Minutes Intravenous On call to O.R. 10/23/24 9351 10/23/24 9166       Assessment/Plan: s/p Procedure(s) with comments: EXPLORATORY LAPAROTOMY, ABDOMINAL PACKING AND WOUND VAC APPLICATION (N/A) - (8) EIGHT 18X18 LAP SPONGE PACKED  POD 1 from lap VHR and reexploration for bleeding -con't support today -will plan on reexploration Fri to remove vac and sponges -off pressors -F/u CBC after blood products  Appreciate CCM assistance   D/w husband and all questions were answered at the bedside.    LOS: 1 day    Spurgeon Gancarz 10/24/2024

## 2024-10-24 NOTE — Anesthesia Procedure Notes (Signed)
 Arterial Line Insertion Start/End12/02/2024 2:20 AM, 10/24/2024 2:25 AM Performed by: Darlyn Rush, MD, anesthesiologist  Patient location: Pre-op. Preanesthetic checklist: patient identified, IV checked, site marked, risks and benefits discussed, surgical consent, monitors and equipment checked, pre-op evaluation, timeout performed and anesthesia consent Lidocaine  1% used for infiltration Left, radial was placed Catheter size: 20 G Hand hygiene performed , maximum sterile barriers used  and Seldinger technique used  Attempts: 1 Procedure performed using ultrasound to evaluate access site. Ultrasound Notes:relevant anatomy identified. Following insertion, dressing applied and Biopatch. Post procedure assessment: normal and unchanged

## 2024-10-24 NOTE — Anesthesia Preprocedure Evaluation (Addendum)
 Anesthesia Evaluation   Patient unresponsive    Reviewed: Allergy & Precautions, Patient's Chart, lab work & pertinent test results, Unable to perform ROS - Chart review onlyPreop documentation limited or incomplete due to emergent nature of procedure.  History of Anesthesia Complications Negative for: history of anesthetic complications  Airway Mallampati: I       Dental  (+) Teeth Intact, Dental Advisory Given, Lower Dentures   Pulmonary Patient abstained from smoking., former smoker   breath sounds clear to auscultation       Cardiovascular hypertension, + CAD, + Cardiac Stents and + Peripheral Vascular Disease  + dysrhythmias  Rhythm:Regular Rate:Normal     Neuro/Psych  Headaches  Anxiety Depression       GI/Hepatic ,GERD  ,,  Endo/Other  diabetesHypothyroidism    Renal/GU Renal hypertensionRenal diseaseRenal Artery Stenosis     Musculoskeletal  (+) Arthritis ,    Abdominal   Peds  Hematology  (+) Blood dyscrasia, anemia   Anesthesia Other Findings   Reproductive/Obstetrics                              Anesthesia Physical Anesthesia Plan  ASA: 4 and emergent  Anesthesia Plan: General   Post-op Pain Management:    Induction: Intravenous  PONV Risk Score and Plan: 3 and Ondansetron , Dexamethasone , Midazolam  and Treatment may vary due to age or medical condition  Airway Management Planned: Oral ETT  Additional Equipment: Arterial line and CVP  Intra-op Plan:   Post-operative Plan: Post-operative intubation/ventilation  Informed Consent: I have reviewed the patients History and Physical, chart, labs and discussed the procedure including the risks, benefits and alternatives for the proposed anesthesia with the patient or authorized representative who has indicated his/her understanding and acceptance.       Plan Discussed with: CRNA  Anesthesia Plan Comments: (See PAT  note from 11/28  Emergency consent.)         Anesthesia Quick Evaluation

## 2024-10-24 NOTE — Progress Notes (Signed)
 Discussed patient worsening condition with critical care and my partner.  She does not seem to be tamponading.  I recommended emergent exploration.  I discussed this with her husband and he agrees to surgery.  Patient unable to consent.    Bernarda JAYSON Ned, MD  Colorectal and General Surgery [facility] Surgery

## 2024-10-24 NOTE — Anesthesia Postprocedure Evaluation (Signed)
 Anesthesia Post Note  Patient: Erin [name]  Procedure(s) Performed: REPAIR, HERNIA, VENTRAL, LAPAROSCOPIC     Patient location during evaluation: PACU Anesthesia Type: General Level of consciousness: awake and alert Pain management: pain level controlled Vital Signs Assessment: post-procedure vital signs reviewed and stable Respiratory status: spontaneous breathing, nonlabored ventilation and respiratory function stable Cardiovascular status: blood pressure returned to baseline and stable Postop Assessment: no apparent nausea or vomiting Anesthetic complications: no   No notable events documented.  Last Vitals:  Vitals:   10/24/24 0645 10/24/24 0700  BP:    Pulse: 69 70  Resp: 14 14  Temp:    SpO2: 100% 100%    Last Pain:  Vitals:   10/24/24 0400  TempSrc: Rectal  PainSc: 0-No pain                 Butler Levander Pinal

## 2024-10-24 NOTE — Anesthesia Procedure Notes (Addendum)
 Procedure Name: Intubation Date/Time: 10/24/2024 2:16 AM  Performed by: Darlyn Rush, MDPre-anesthesia Checklist: Patient identified, Emergency Drugs available, Suction available and Patient being monitored Patient Re-evaluated:Patient Re-evaluated prior to induction Oxygen Delivery Method: Circle System Utilized Preoxygenation: Pre-oxygenation with 100% oxygen Induction Type: IV induction Ventilation: Mask ventilation without difficulty Laryngoscope Size: Miller and 2 Grade View: Grade I Tube type: Oral Number of attempts: 1 Airway Equipment and Method: Stylet and Oral airway Placement Confirmation: ETT inserted through vocal cords under direct vision, positive ETCO2 and breath sounds checked- equal and bilateral Secured at: 21 cm Tube secured with: Tape Dental Injury: Teeth and Oropharynx as per pre-operative assessment

## 2024-10-24 NOTE — Anesthesia Preprocedure Evaluation (Addendum)
 Anesthesia Evaluation  Patient identified by MRN, date of birth, ID band Patient unresponsive    Reviewed: Allergy & Precautions, H&P , NPO status , Patient's Chart, lab work & pertinent test results  Airway Mallampati: Intubated       Dental no notable dental hx. (+) Teeth Intact, Dental Advisory Given   Pulmonary neg pulmonary ROS, Patient abstained from smoking., former smoker Intubated   Pulmonary exam normal        Cardiovascular Exercise Tolerance: Good hypertension, Pt. on medications and Pt. on home beta blockers + CAD, + Cardiac Stents and + Peripheral Vascular Disease   Rhythm:Regular Rate:Normal     Neuro/Psych  Headaches  Anxiety Depression       GI/Hepatic Neg liver ROS, hiatal hernia,GERD  Medicated,,  Endo/Other  diabetesHypothyroidism    Renal/GU Renal InsufficiencyRenal disease  negative genitourinary   Musculoskeletal  (+) Arthritis , Osteoarthritis,    Abdominal   Peds  Hematology  (+) Blood dyscrasia, anemia   Anesthesia Other Findings   Reproductive/Obstetrics negative OB ROS                              Anesthesia Physical Anesthesia Plan  ASA: 4  Anesthesia Plan: General   Post-op Pain Management:    Induction: Intravenous  PONV Risk Score and Plan: 3 and Ondansetron  and Dexamethasone   Airway Management Planned: Oral ETT  Additional Equipment:   Intra-op Plan:   Post-operative Plan: Post-operative intubation/ventilation  Informed Consent: I have reviewed the patients History and Physical, chart, labs and discussed the procedure including the risks, benefits and alternatives for the proposed anesthesia with the patient or authorized representative who has indicated his/her understanding and acceptance.     Dental advisory given  Plan Discussed with: CRNA  Anesthesia Plan Comments:          Anesthesia Quick Evaluation

## 2024-10-24 NOTE — Progress Notes (Signed)
 End of shift note of events of last night: 10/24/19:00-Foley placed 10/24/19:07 BP low 500 ML bolus given 10/23/2028 Levo started 10/23/2029- patient had C/O nausea, Zofran  given 10/23/2125 2nd 500 ml bolus given 10/23/2218 1 liter NS bolus given 10/24/16 Patient taken to CT scan after CVL was placed due to drop in hemoglobin 10/25/99:30 first unit of PRBC started along with cryo and FFP,  10/25/99:51 Calcium  gluconate given for Ca level of 5.7 10/24/99 4g Mag given for level of 1.6 10/25/143 150 Meq NaBicarb push given 10/24/199 Levo changed to quad strength and vasopressin also started, then patient sent to surgery 10/24/329 patient back from surgery with wound vac, on vent and with art line, husband at bed side. 10/24/529 1 unit of cry and 1 unit of FFP given as per orders.

## 2024-10-24 NOTE — Progress Notes (Signed)
 I reviewed last 24 h vitals and pain scores, last 48 h intake and output, last 24 h labs and trends, and last 24 h imaging results.    H&H following transfusion 11.4/31.4 BUN of 22, creatinine of 1.93 AST of 446, ALT of 269, platelets low at 26  Recheck labs at 1500 hrs., will check DIC panel

## 2024-10-24 NOTE — Progress Notes (Signed)
 Vitals stabilized.  Patient appears comfortable.  Discussed patient's care with her husband.  Still getting blood products.  Will need recheck labs once complete  Bernarda JAYSON Ned, MD  Colorectal and General Surgery [facility] Surgery

## 2024-10-24 NOTE — Op Note (Signed)
 10/23/2024 - 10/24/2024  3:13 AM  PATIENT:  Erin [name]  68 y.o. female  Patient Care Team: [name] [name], MD as PCP - General (Family Medicine) Wonda Ozell, MD as PCP - Cardiology (Cardiology) Georjean Darice [name], MD as Consulting Physician (Neurology) Lelon Glendia ONEIDA DEVONNA as Physician Assistant (Cardiology)  PRE-OPERATIVE DIAGNOSIS:  ABDOMINAL HEMORRHAGE  POST-OPERATIVE DIAGNOSIS:  ABDOMINAL HEMORRHAGE  PROCEDURE:  EXPLORATORY LAPAROTOMY, ABDOMINAL PACKING AND OPEN WOUND VAC APPLICATION    Surgeon(s): Debby Hila, MD  ASSISTANT: none   ANESTHESIA:   general  EBL: unknown  Total I/O In: 4135.3 [I.V.:869.7; Blood:887.5; IV Piggyback:2378] Out: 350 [Urine:350] 2upRBC, 1 u Cryo, 1u FFP  DRAINS: none   SPECIMEN:  No Specimen  DISPOSITION OF SPECIMEN:  N/A  COUNTS:  8 sponges intentionally left in abdomen.  All other counts correct  PLAN OF CARE: patient already admitted  PATIENT DISPOSITION:  ICU: critical but stable condition  INDICATION: acute blood loss anemia   OR FINDINGS: Right sided abdominal wall and adominal hemoperitoneum with no active source identified.    DESCRIPTION: Emergent consent was obtained.  Verbal consent was obtained from the patient's husband.  The patient was identified in the preoperative holding area and taken to the OR where they were laid supine on the operating room table.  General anesthesia was induced without difficulty. SCDs were also noted to be in place prior to the initiation of anesthesia.  The patient was then prepped and draped in the usual sterile fashion.   A surgical timeout was performed indicating the correct patient, procedure, positioning and need for preoperative antibiotics.   I began by making a midline incision using a 10 blade scalpel.  This was carried down through the subcutaneous tissues.  Extravasated blood was noted throughout the subcutaneous tissues on the right side.  I entered into the hernia sac  and then identified the mesh below.  The mesh was incised using scissors.  The abdomen was entered and a large amount of hemoperitoneum and clot was evacuated.  The abdomen was then packed off with multiple sponges.  I evaluated the patient's left side and pelvis.  There was no signs of active bleeding.  I then began systematically evaluating the patient's right side.  There appeared to be some bright red blood coming from the upper abdominal wall, but I could not identify a clear source.  Given the patient's clinical condition I decided to pack the abdomen and return her to the ICU for continued resuscitation.  8 sponges were packed in through the patient's right upper quadrant.  There was no active bleeding noted within this area after it was packed.  An ABThera wound VAC was placed.  The patient was then transferred to the ICU intubated and sedated in critical but stable condition.  Hila JAYSON Debby, MD  Colorectal and General Surgery [facility] Surgery

## 2024-10-24 NOTE — Transfer of Care (Signed)
 Immediate Anesthesia Transfer of Care Note  Patient: Erin [name]  Procedure(s) Performed: EXPLORATORY LAPAROTOMY, ABDOMINAL PACKING AND WOUND VAC APPLICATION (Abdomen)  Patient Location: PACU and ICU  Anesthesia Type:General  Level of Consciousness: sedated  Airway & Oxygen Therapy: Patient remains intubated per anesthesia plan  Post-op Assessment: Report given to RN and Post -op Vital signs reviewed and stable  Post vital signs: Reviewed and stable  Last Vitals:  Vitals Value Taken Time  BP    Temp    Pulse    Resp    SpO2      Last Pain:  Vitals:   10/24/24 0045  TempSrc: Axillary  PainSc:       Patients Stated Pain Goal: 0 (10/23/24 2200)  Complications: No notable events documented.

## 2024-10-25 ENCOUNTER — Encounter (HOSPITAL_COMMUNITY)
Admission: AD | Disposition: A | Discharge status: Rehabilitation Facility | Payer: Self-pay | Source: Home / Self Care | Attending: Internal Medicine

## 2024-10-25 ENCOUNTER — Encounter (HOSPITAL_COMMUNITY): Payer: Self-pay | Admitting: General Surgery

## 2024-10-25 ENCOUNTER — Encounter (HOSPITAL_COMMUNITY): Admitting: Certified Registered Nurse Anesthetist

## 2024-10-25 ENCOUNTER — Inpatient Hospital Stay (HOSPITAL_COMMUNITY): Admitting: Certified Registered Nurse Anesthetist

## 2024-10-25 ENCOUNTER — Inpatient Hospital Stay (HOSPITAL_COMMUNITY)

## 2024-10-25 DIAGNOSIS — E440 Moderate protein-calorie malnutrition: Secondary | ICD-10-CM | POA: Insufficient documentation

## 2024-10-25 HISTORY — PX: LAPAROTOMY: SHX154

## 2024-10-25 LAB — COMPREHENSIVE METABOLIC PANEL WITH GFR
ALT: 111 U/L — ABNORMAL HIGH (ref 0–44)
AST: 326 U/L — ABNORMAL HIGH (ref 15–41)
Albumin: 2.3 g/dL — ABNORMAL LOW (ref 3.5–5.0)
Alkaline Phosphatase: 52 U/L (ref 38–126)
Anion gap: 10 (ref 5–15)
BUN: 27 mg/dL — ABNORMAL HIGH (ref 8–23)
CO2: 31 mmol/L (ref 22–32)
Calcium: 6.8 mg/dL — ABNORMAL LOW (ref 8.9–10.3)
Chloride: 100 mmol/L (ref 98–111)
Creatinine, Ser: 2.75 mg/dL — ABNORMAL HIGH (ref 0.44–1.00)
GFR, Estimated: 18 mL/min — ABNORMAL LOW (ref >60)
Glucose, Bld: 117 mg/dL — ABNORMAL HIGH (ref 70–99)
Potassium: 3.2 mmol/L — ABNORMAL LOW (ref 3.5–5.1)
Sodium: 140 mmol/L (ref 135–145)
Total Bilirubin: <0.2 mg/dL (ref 0.0–1.2)
Total Protein: 3.8 g/dL — ABNORMAL LOW (ref 6.5–8.1)

## 2024-10-25 LAB — BPAM PLATELET PHERESIS
Blood Product Expiration Date: 202512052359
Blood Product Expiration Date: 202512062359
Blood Product Expiration Date: 202512082359
Blood Product Unit Number: 202512052359
ISSUE DATE / TIME: 202512041158
ISSUE DATE / TIME: 202512041252
PRODUCT CODE: 202512051125
PRODUCT CODE: 202512062359
PRODUCT CODE: 202512062359
PRODUCT CODE: 202512062359
Unit Type and Rh: 202512052359
Unit Type and Rh: 5100
Unit Type and Rh: 5100
Unit Type and Rh: 6200
Unit Type and Rh: 6200
Unit Type and Rh: 6200
Unit Type and Rh: 7300
Unit Type and Rh: 7300

## 2024-10-25 LAB — BASIC METABOLIC PANEL WITH GFR
Anion gap: 11 (ref 5–15)
BUN: 27 mg/dL — ABNORMAL HIGH (ref 8–23)
CO2: 28 mmol/L (ref 22–32)
Calcium: 6.4 mg/dL — CL (ref 8.9–10.3)
Chloride: 102 mmol/L (ref 98–111)
Creatinine, Ser: 2.65 mg/dL — ABNORMAL HIGH (ref 0.44–1.00)
GFR, Estimated: 19 mL/min — ABNORMAL LOW (ref >60)
Glucose, Bld: 102 mg/dL — ABNORMAL HIGH (ref 70–99)
Potassium: 2.8 mmol/L — ABNORMAL LOW (ref 3.5–5.1)
Sodium: 140 mmol/L (ref 135–145)

## 2024-10-25 LAB — BLOOD GAS, VENOUS
Acid-Base Excess: 5.6 mmol/L — ABNORMAL HIGH (ref 0.0–2.0)
Bicarbonate: 31.5 mmol/L — ABNORMAL HIGH (ref 20.0–28.0)
O2 Saturation: 76 %
Patient temperature: 37
pCO2, Ven: 52 mmHg (ref 44–60)
pH, Ven: 7.39 (ref 7.25–7.43)
pO2, Ven: 47 mmHg — ABNORMAL HIGH (ref 32–45)

## 2024-10-25 LAB — CBC
HCT: 28.5 % — ABNORMAL LOW (ref 36.0–46.0)
Hemoglobin: 10.4 g/dL — ABNORMAL LOW (ref 12.0–15.0)
MCH: 31 pg (ref 26.0–34.0)
MCHC: 36.5 g/dL — ABNORMAL HIGH (ref 30.0–36.0)
MCV: 84.8 fL (ref 80.0–100.0)
Platelets: 44 K/uL — ABNORMAL LOW (ref 150–400)
RBC: 3.36 MIL/uL — ABNORMAL LOW (ref 3.87–5.11)
RDW: 15 % (ref 11.5–15.5)
WBC: 8.8 K/uL (ref 4.0–10.5)
nRBC: 0 % (ref 0.0–0.2)

## 2024-10-25 LAB — HEMOGLOBIN AND HEMATOCRIT, BLOOD
HCT: 26.4 % — ABNORMAL LOW (ref 36.0–46.0)
HCT: 27.2 % — ABNORMAL LOW (ref 36.0–46.0)
HCT: 27.2 % — ABNORMAL LOW (ref 36.0–46.0)
HCT: 28.2 % — ABNORMAL LOW (ref 36.0–46.0)
HCT: 29.5 % — ABNORMAL LOW (ref 36.0–46.0)
Hemoglobin: 10.2 g/dL — ABNORMAL LOW (ref 12.0–15.0)
Hemoglobin: 10.9 g/dL — ABNORMAL LOW (ref 12.0–15.0)
Hemoglobin: 9.1 g/dL — ABNORMAL LOW (ref 12.0–15.0)
Hemoglobin: 9.5 g/dL — ABNORMAL LOW (ref 12.0–15.0)
Hemoglobin: 9.6 g/dL — ABNORMAL LOW (ref 12.0–15.0)

## 2024-10-25 LAB — PREPARE PLATELET PHERESIS
Unit division: 0
Unit division: 0
Unit division: 0
Unit division: 0
Unit division: 0

## 2024-10-25 LAB — PREPARE CRYOPRECIPITATE
Unit division: 0
Unit division: 0

## 2024-10-25 LAB — PREPARE FRESH FROZEN PLASMA: Unit division: 0

## 2024-10-25 LAB — BPAM CRYOPRECIPITATE
Blood Product Expiration Date: 202512040610
Blood Product Expiration Date: 202512040800
ISSUE DATE / TIME: 202512040130
ISSUE DATE / TIME: 202512040537
Unit Type and Rh: 6200
Unit Type and Rh: 6200

## 2024-10-25 LAB — PHOSPHORUS: Phosphorus: 5.2 mg/dL — ABNORMAL HIGH (ref 2.5–4.6)

## 2024-10-25 LAB — DIC (DISSEMINATED INTRAVASCULAR COAGULATION)PANEL
D-Dimer, Quant: 4.64 ug{FEU}/mL — ABNORMAL HIGH (ref 0.00–0.50)
Fibrinogen: 490 mg/dL — ABNORMAL HIGH (ref 210–475)
INR: 1.3 — ABNORMAL HIGH (ref 0.8–1.2)
Platelets: 42 K/uL — ABNORMAL LOW (ref 150–400)
Prothrombin Time: 16.6 s — ABNORMAL HIGH (ref 11.4–15.2)
Smear Review: NONE SEEN
aPTT: 36 s (ref 24–36)

## 2024-10-25 LAB — GLUCOSE, CAPILLARY
Glucose-Capillary: 101 mg/dL — ABNORMAL HIGH (ref 70–99)
Glucose-Capillary: 105 mg/dL — ABNORMAL HIGH (ref 70–99)
Glucose-Capillary: 142 mg/dL — ABNORMAL HIGH (ref 70–99)
Glucose-Capillary: 94 mg/dL (ref 70–99)
Glucose-Capillary: 97 mg/dL (ref 70–99)
Glucose-Capillary: 98 mg/dL (ref 70–99)

## 2024-10-25 LAB — BPAM FFP
Blood Product Expiration Date: 202512050010
Blood Product Expiration Date: 202512050200
ISSUE DATE / TIME: 202512040129
ISSUE DATE / TIME: 202512040540
Unit Type and Rh: 6200
Unit Type and Rh: 6200

## 2024-10-25 LAB — TRIGLYCERIDES: Triglycerides: 196 mg/dL — ABNORMAL HIGH (ref <150)

## 2024-10-25 MED ORDER — GABAPENTIN 250 MG/5ML PO SOLN
100.0000 mg | Freq: Three times a day (TID) | ORAL | Status: DC
  Administered 2024-10-25 – 2024-10-27 (×7): 100 mg
  Filled 2024-10-25 (×9): qty 2

## 2024-10-25 MED ORDER — ALBUMIN HUMAN 5 % IV SOLN: INTRAVENOUS | Status: DC | PRN

## 2024-10-25 MED ORDER — PHENYLEPHRINE HCL-NACL 20-0.9 MG/250ML-% IV SOLN
INTRAVENOUS | Status: DC | PRN
  Administered 2024-10-25: 50 ug/min via INTRAVENOUS

## 2024-10-25 MED ORDER — ROCURONIUM BROMIDE 100 MG/10ML IV SOLN
INTRAVENOUS | Status: DC | PRN
  Administered 2024-10-25: 60 mg via INTRAVENOUS

## 2024-10-25 MED ORDER — FENTANYL CITRATE (PF) 100 MCG/2ML IJ SOLN
INTRAMUSCULAR | Status: DC | PRN
  Administered 2024-10-25 (×2): 50 ug via INTRAVENOUS

## 2024-10-25 MED ORDER — MIDAZOLAM HCL 2 MG/2ML IJ SOLN
INTRAMUSCULAR | Status: AC
  Filled 2024-10-25: qty 2

## 2024-10-25 MED ORDER — POTASSIUM CHLORIDE 20 MEQ PO PACK
40.0000 meq | PACK | ORAL | Status: AC
  Administered 2024-10-25 (×2): 40 meq
  Filled 2024-10-25 (×2): qty 2

## 2024-10-25 MED ORDER — PHENYLEPHRINE HCL (PRESSORS) 10 MG/ML IV SOLN
INTRAVENOUS | Status: AC
  Filled 2024-10-25: qty 1

## 2024-10-25 MED ORDER — ONDANSETRON HCL 4 MG/2ML IJ SOLN
INTRAMUSCULAR | Status: DC | PRN
  Administered 2024-10-25: 4 mg via INTRAVENOUS

## 2024-10-25 MED ORDER — LEVOTHYROXINE SODIUM 75 MCG PO TABS
75.0000 ug | ORAL_TABLET | Freq: Every day | ORAL | Status: DC
  Administered 2024-10-26 – 2024-11-24 (×29): 75 ug
  Filled 2024-10-25 (×23): qty 1

## 2024-10-25 MED ORDER — 0.9 % SODIUM CHLORIDE (POUR BTL) OPTIME
TOPICAL | Status: DC | PRN
  Administered 2024-10-25: 2000 mL

## 2024-10-25 MED ORDER — PHENYLEPHRINE 80 MCG/ML (10ML) SYRINGE FOR IV PUSH (FOR BLOOD PRESSURE SUPPORT)
PREFILLED_SYRINGE | INTRAVENOUS | Status: AC
  Filled 2024-10-25: qty 10

## 2024-10-25 MED ORDER — FENTANYL CITRATE (PF) 100 MCG/2ML IJ SOLN
INTRAMUSCULAR | Status: AC
  Filled 2024-10-25: qty 2

## 2024-10-25 MED ORDER — PHENYLEPHRINE 80 MCG/ML (10ML) SYRINGE FOR IV PUSH (FOR BLOOD PRESSURE SUPPORT)
PREFILLED_SYRINGE | INTRAVENOUS | Status: DC | PRN
  Administered 2024-10-25: 80 ug via INTRAVENOUS

## 2024-10-25 MED ORDER — ROCURONIUM BROMIDE 10 MG/ML (PF) SYRINGE
PREFILLED_SYRINGE | INTRAVENOUS | Status: AC
  Filled 2024-10-25: qty 10

## 2024-10-25 MED ORDER — ALBUMIN HUMAN 5 % IV SOLN
INTRAVENOUS | Status: AC
  Filled 2024-10-25: qty 250

## 2024-10-25 MED ORDER — MIDAZOLAM HCL (PF) 2 MG/2ML IJ SOLN
INTRAMUSCULAR | Status: DC | PRN
  Administered 2024-10-25: 2 mg via INTRAVENOUS

## 2024-10-25 MED ORDER — PROPOFOL 10 MG/ML IV BOLUS
INTRAVENOUS | Status: AC
  Filled 2024-10-25: qty 20

## 2024-10-25 MED ORDER — CEFAZOLIN SODIUM-DEXTROSE 1-4 GM/50ML-% IV SOLN
INTRAVENOUS | Status: DC | PRN
  Administered 2024-10-25: 2 g via INTRAVENOUS

## 2024-10-25 MED ORDER — LACTATED RINGERS IV SOLN: INTRAVENOUS | Status: DC | PRN

## 2024-10-25 MED ORDER — ONDANSETRON HCL 4 MG/2ML IJ SOLN
INTRAMUSCULAR | Status: AC
  Filled 2024-10-25: qty 2

## 2024-10-25 MED ORDER — MAGNESIUM SULFATE 2 GM/50ML IV SOLN
2.0000 g | Freq: Once | INTRAVENOUS | Status: AC
  Administered 2024-10-25: 2 g via INTRAVENOUS
  Filled 2024-10-25: qty 50

## 2024-10-25 MED ORDER — SUGAMMADEX SODIUM 200 MG/2ML IV SOLN
INTRAVENOUS | Status: DC | PRN
  Administered 2024-10-25: 400 mg via INTRAVENOUS

## 2024-10-25 MED ORDER — CALCIUM GLUCONATE-NACL 2-0.675 GM/100ML-% IV SOLN
2.0000 g | Freq: Once | INTRAVENOUS | Status: AC
  Administered 2024-10-25: 2000 mg via INTRAVENOUS
  Filled 2024-10-25: qty 100

## 2024-10-25 MED ORDER — FAMOTIDINE 20 MG PO TABS
20.0000 mg | ORAL_TABLET | Freq: Every day | ORAL | Status: DC
  Administered 2024-10-26 – 2024-10-29 (×4): 20 mg
  Filled 2024-10-25 (×4): qty 1

## 2024-10-25 SURGICAL SUPPLY — 29 items
BAG COUNTER SPONGE SURGICOUNT (BAG) IMPLANT
BINDER ABD UNIV 12 45-62 (WOUND CARE) IMPLANT
BLADE CLIPPER SURG (BLADE) IMPLANT
BLADE EXTENDED COATED 6.5IN (ELECTRODE) IMPLANT
CHLORAPREP W/TINT 26 (MISCELLANEOUS) ×1 IMPLANT
DERMABOND ADVANCED .7 DNX12 (GAUZE/BANDAGES/DRESSINGS) ×1 IMPLANT
DRAPE LAPAROSCOPIC ABDOMINAL (DRAPES) ×1 IMPLANT
DRAPE WARM FLUID 44X44 (DRAPES) ×1 IMPLANT
DRSG OPSITE POSTOP 4X8 (GAUZE/BANDAGES/DRESSINGS) IMPLANT
ELECT REM PT RETURN 15FT ADLT (MISCELLANEOUS) ×1 IMPLANT
GAUZE SPONGE 4X4 12PLY STRL (GAUZE/BANDAGES/DRESSINGS) ×1 IMPLANT
GLOVE BIO SURGEON STRL SZ7.5 (GLOVE) ×1 IMPLANT
GOWN STRL REUS W/ TWL XL LVL3 (GOWN DISPOSABLE) IMPLANT
HANDLE SUCTION POOLE (INSTRUMENTS) ×1 IMPLANT
KIT BASIN OR (CUSTOM PROCEDURE TRAY) ×1 IMPLANT
KIT TURNOVER KIT A (KITS) ×1 IMPLANT
NS IRRIG 1000ML POUR BTL (IV SOLUTION) ×2 IMPLANT
PACK GENERAL/GYN (CUSTOM PROCEDURE TRAY) ×1 IMPLANT
PROTECTOR NERVE ULNAR (MISCELLANEOUS) ×2 IMPLANT
STAPLER SKIN PROX 35W (STAPLE) ×1 IMPLANT
SUT NOVA NAB GS-21 0 18 T12 DT (SUTURE) IMPLANT
SUT PDS AB 1 TP1 96 (SUTURE) ×2 IMPLANT
SUT SILK 2 0 SH CR/8 (SUTURE) ×1 IMPLANT
SUT SILK 2-0 30XBRD TIE 12 (SUTURE) ×1 IMPLANT
SUT SILK 3 0 12 30 (SUTURE) ×1 IMPLANT
SUT SILK 3 0 SH CR/8 (SUTURE) ×1 IMPLANT
TOWEL OR DSP ST BLU DLX 10/PK (DISPOSABLE) ×2 IMPLANT
TRAY FOLEY MTR SLVR 16FR STAT (SET/KITS/TRAYS/PACK) IMPLANT
YANKAUER SUCT BULB TIP NO VENT (SUCTIONS) IMPLANT

## 2024-10-25 NOTE — Transfer of Care (Signed)
 Immediate Anesthesia Transfer of Care Note  Patient: Erin [name]  Procedure(s) Performed: LAPAROTOMY, EXPLORATORY  Patient Location: PACU and ICU  Anesthesia Type:General  Level of Consciousness: sedated and Patient remains intubated per anesthesia plan  Airway & Oxygen Therapy: Patient remains intubated per anesthesia plan  Post-op Assessment: Report given to RN and Post -op Vital signs reviewed and stable  Post vital signs: Reviewed and stable  Last Vitals:  Vitals Value Taken Time  BP    Temp 36.5 C 10/25/24 09:09  Pulse 78 10/25/24 09:07  Resp 19 10/25/24 09:09  SpO2 100 % 10/25/24 09:02  Vitals shown include unfiled device data.  Last Pain:  Vitals:   10/25/24 0000  TempSrc: Rectal  PainSc:       Patients Stated Pain Goal: 0 (10/24/24 0400)  Complications: No notable events documented.

## 2024-10-25 NOTE — Progress Notes (Signed)
 PT has returned to OR- RT has not assessed PT / Vent at this time.

## 2024-10-25 NOTE — Progress Notes (Signed)
 Arterial line at this time has good waveform, draws and flushes, insertion site has old blood but does not appear to be actively bleeding at this time (was placed in OR).

## 2024-10-25 NOTE — Plan of Care (Signed)
  Problem: Clinical Measurements: Goal: Cardiovascular complication will be avoided Outcome: Progressing   Problem: Clinical Measurements: Goal: Respiratory complications will improve Outcome: Not Progressing   Problem: Elimination: Goal: Will not experience complications related to bowel motility Outcome: Not Progressing Goal: Will not experience complications related to urinary retention Outcome: Not Progressing   Problem: Skin Integrity: Goal: Risk for impaired skin integrity will decrease Outcome: Not Progressing

## 2024-10-25 NOTE — Progress Notes (Signed)
 * Day of Surgery *   Subjective/Chief Complaint: No acute changes overnight Off pressors   Objective: Vital signs in last 24 hours: Temp:  [99.3 F (37.4 C)-100.6 F (38.1 C)] 100.6 F (38.1 C) (12/04 1830) Pulse Rate:  [70-79] 74 (12/04 1830) Resp:  [12-18] 14 (12/04 1830) BP: (123)/(71) 123/71 (12/04 0758) SpO2:  [96 %-100 %] 96 % (12/04 1830) Arterial Line BP: (110-161)/(53-82) 138/53 (12/04 1830) FiO2 (%):  [30 %-50 %] 30 % (12/05 0308) Last BM Date :  (pta)  Intake/Output from previous day: 12/04 0701 - 12/05 0700 In: 3152 [I.V.:2706; Blood:396; NG/GT:50] Out: 2830 [Urine:480; Emesis/NG output:800; Drains:1550] Intake/Output this shift: Total I/O In: 1597.7 [I.V.:1597.7] Out: 1375 [Urine:175; Emesis/NG output:700; Drains:500]  Gen: Sedated CArdio: RRR, BP WNL Abd: vac in place  Lab Results:  Recent Labs    10/24/24 1500 10/24/24 1940 10/24/24 2151 10/24/24 2353 10/25/24 0350 10/25/24 0507  WBC 8.8  --   --   --   --  8.8  HGB 11.6*   < >  --    < > 10.2* 10.4*  HCT 32.2*   < >  --    < > 28.2* 28.5*  PLT 34*  36*  --  38*  --   --  42*  44*   < > = values in this interval not displayed.   BMET Recent Labs    10/24/24 0610 10/25/24 0507  NA 140 140  K 3.8 3.2*  CL 106 100  CO2 22 31  GLUCOSE 130* 117*  BUN 22 27*  CREATININE 1.93* 2.75*  CALCIUM  6.8* 6.8*   PT/INR Recent Labs    10/24/24 2151 10/25/24 0507  LABPROT 16.5* 16.6*  INR 1.3* 1.3*   ABG Recent Labs    10/24/24 2144  PHART 7.38  HCO3 27.3    Studies/Results: DG Abd Portable 1V Result Date: 10/24/2024 CLINICAL DATA:  Nasogastric tube advancement. EXAM: PORTABLE ABDOMEN - 1 VIEW COMPARISON:  CT earlier today FINDINGS: Tip and side port of the enteric tube below the diaphragm in the stomach. Multiple serpiginous densities in the right hemiabdomen with air projecting over the soft tissues likely postoperative in nature. IMPRESSION: Tip and side port of the enteric tube below  the diaphragm in the stomach. Electronically Signed   By: Andrea Gasman M.D.   On: 10/24/2024 11:41   DG CHEST PORT 1 VIEW Result Date: 10/24/2024 EXAM: 1 VIEW(S) XRAY OF THE CHEST 10/24/2024 06:04:00 AM COMPARISON: 10/23/2024 CLINICAL HISTORY: Intubation of airway performed without difficulty FINDINGS: LINES, TUBES AND DEVICES: Endotracheal tube in place with tip 2.2 cm above the carina. Enteric tube in place with tip in the region of the gastroesophageal junction. Left internal jugular central venous catheter in place with tip in the superior vena cava. LUNGS AND PLEURA: Small bilateral pleural effusions. Hazy bibasilar airspace opacities, possibly representing atelectasis or pneumonia. Prominent bilateral interstitial opacities, possibly representing mild pulmonary edema or atelectasis. No pneumothorax. HEART AND MEDIASTINUM: No acute abnormality of the cardiac and mediastinal silhouettes. BONES AND SOFT TISSUES: No acute osseous abnormality. Gaseous distention of stomach. IMPRESSION: 1. Enteric tube is under-advanced with tip at the level of the ge junction. Recommend advancement. 2. Small bilateral pleural effusions, new from prior exam. 3. Prominent bilateral interstitial opacities, which are favored to reflect pulmonary edema. 4. New hazy bibasilar airspace opacities, which may reflect atelectasis or pneumonia. Electronically signed by: Waddell Calk MD 10/24/2024 06:21 AM EST RP Workstation: [id number]   CT Angio Abd/Pel w/  and/or w/o Result Date: 10/24/2024 EXAM: CTA ABDOMEN AND PELVIS WITHOUT AND WITH CONTRAST 10/24/2024 12:45:40 AM TECHNIQUE: CTA images of the abdomen and pelvis without and with intravenous contrast. Three-dimensional MIP/volume rendered formations were performed. Automated exposure control, iterative reconstruction, and/or weight based adjustment of the mA/kV was utilized to reduce the radiation dose to as low as reasonably achievable. COMPARISON: 06/18/2024 CLINICAL HISTORY:  Retroperitoneal bleed suspected. Status post abdominal wall hernia repair on 10/23/2024 and recent renal artery stent for stenosis. FINDINGS: VASCULATURE: Low-density blood pool compatible with anemia. AORTA: No acute finding. No abdominal aortic aneurysm. No dissection. CELIAC TRUNK: The celiac axis stent is patent. SUPERIOR MESENTERIC ARTERY: The SMA stent is patent. There is severe narrowing of the SMA immediately distal to the stent. INFERIOR MESENTERIC ARTERY: The IMA is not well visualized. RENAL ARTERIES: Diminutive right renal artery. Minimal enhancement of the left kidney with non-opacification of the left renal artery which appears occluded near the origin (series 13 image 94). ILIAC ARTERIES: No acute finding. Patent aortobifemoral bypass grafts. LIVER: The liver is unremarkable. GALLBLADDER AND BILE DUCTS: Gallbladder wall thickening is likely reactive and/or due to 3rd spacing of fluid. No biliary ductal dilatation. SPLEEN: The spleen is unremarkable. PANCREAS: The pancreas is unremarkable. ADRENAL GLANDS: Bilateral adrenal glands demonstrate no acute abnormality. KIDNEYS, URETERS AND BLADDER: There is a peripheral wedge-shaped area of non-enhancement in the superior pole of the right kidney compatible with infarct. Minimal enhancement of the left kidney with non-opacification of the left renal artery which appears occluded near the origin (series 13 image 94). No stones in the kidneys or ureters. No hydronephrosis. No perinephric or periureteral stranding. Foley catheter and gas in the decompressed bladder. GI AND BOWEL: Patulous lower esophagus containing fluid. Distended stomach with air-fluid level. Wall thickening and mucosal hyperenhancement of the gastric antrum and duodenum. Wall thickening and mucosal hyperenhancement of the duodenum, jejunum as well as the cecum and ascending and transverse colon presumably due to reactive inflammation from adjacent hemorrhage. REPRODUCTIVE: No acute  abnormality. PERITONEUM AND RETROPERITONEUM: Large right intraperitoneal hematoma measuring 14.9 x 8.0 x 13.2 cm. There is active bleeding from the right epigastric artery (circa series 7 image 115 and series 9 image 48). Hemorrhage tracks inferiorly into the pelvis. Tiny amount of free intraperitoneal gas in the anterior peritoneum is likely postoperative. LUNG BASE: Bilateral pleural effusions and compressive atelectasis. LYMPH NODES: No lymphadenopathy. BONES AND SOFT TISSUES: Right hip arthroplasty. Extensive subcutaneous edema in the abdominal wall. Hemorrhage extends through a defect in the anterior peritoneum into the subcutaneous fat of the ventral abdominal wall (series 7, images 107-113). Subcutaneous emphysema is presumed postoperative. No acute abnormality of the bones. IMPRESSION: 1. Large right intraperitoneal hematoma with active bleeding from the right epigastric artery. Large volume hemorrhage throughout the abdomen and pelvis. 2. 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 3. Diminutive right renal artery with infarct in the upper pole of the right kidney. 4. Severe narrowing of the SMA immediately distal to the stent, which is patent. 5. Reactive inflammation of the small bowel colon. 6. Low-density blood pool on noncontrast images compatible with anemia. 7. Body wall anasarca. Critical value/emergent results were called by telephone at the time of interpretation on 12 / 4 / 25 at 1:10 pm to Dr. DOROTHA Na, who verbally acknowledged these results. Electronically signed by: Norman Gatlin MD 10/24/2024 01:13 AM EST RP Workstation: [id number]   DG CHEST PORT 1 VIEW Result Date: 10/23/2024 EXAM: 1  VIEW(S) XRAY OF THE CHEST 10/23/2024 10:50:52 PM COMPARISON: None available. CLINICAL HISTORY: Encounter for central line placement [id number] FINDINGS: LINES, TUBES AND DEVICES: Left internal jugular central venous catheter in place with tip overlying the  superior caval atrial junction. LUNGS AND PLEURA: Benign calcified granuloma in right base. No pleural effusion. No pneumothorax. HEART AND MEDIASTINUM: Calcified aorta. No acute abnormality of the cardiac silhouette. BONES AND SOFT TISSUES: No acute osseous abnormality. UPPER ABDOMEN: Gaseous distention of stomach. IMPRESSION: 1. Left internal jugular central venous catheter in place with tip overlying the superior caval atrial junction. Electronically signed by: Norman Gatlin MD 10/23/2024 10:59 PM EST RP Workstation: [id number]    Anti-infectives: Anti-infectives (From admission, onward)    Start     Dose/Rate Route Frequency Ordered Stop   10/23/24 0700  ceFAZolin  (ANCEF ) IVPB 2g/100 mL premix        2 g 200 mL/hr over 30 Minutes Intravenous On call to O.R. 10/23/24 9351 10/23/24 9166       Assessment/Plan: s/p Procedure(s) with comments: EXPLORATORY LAPAROTOMY, ABDOMINAL PACKING AND WOUND VAC APPLICATION (N/A) - (8) EIGHT 18X18 LAP SPONGE PACKED   POD 2 from lap VHR and reexploration for bleeding -Plan fo re -ex lap today to close abd and check for hemostasis off pressors -Cr elev.  May need NEphro consult   Appreciate CCM assistance    D/w husband and all questions were answered at the bedside.  LOS: 2 days    Erin [name] 10/25/2024

## 2024-10-25 NOTE — Consult Note (Signed)
 [facility]  INPATIENT CONSULTATION  Reason for Consultation: AKI on CKD Requesting Provider: Dr. Rubin  HPI: Erin [name] is an 68 y.o. 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   Underwent hernia repair 12/3 which appeared uncomplicated. Post op she had a rapid response for hypotension secondary to intra abdominal hemorrhage noted on CTA.  Required multiple units pRBC, vasopressor support.  She had emergent return to the OR 12/4 overnight and had packing done; repeat OR today for closure after no further active bleeding noted.   She's been febrile with Tm 101 in the past 12h, WBC 8.8, Hb nadir 3.8 now 10 > 9.5, pt 40s currently.  K 3.2 this AM, bicarb 31, BUN 27, Cr 2.75.  Baseline Cr appears to be in the 1.1.-1.5 range this year.  Was 1.5 pre op, 1.7 day of OR 1 and has trended up to 2.75 today.  UOP yesterday, so far today.  She is neg +10.5L this admission.   CTA  noted: KIDNEYS, URETERS AND BLADDER:There is a peripheral wedge-shaped area of non-enhancement in the superior pole of the right kidney compatible with infarct. Minimal enhancement of the left kidney with non-opacification of the left renal artery which appears occluded near the origin (series 13 image 94). No stones in the kidneys or ureters. No hydronephrosis. No perinephric or periureteral stranding. Foley catheter and gas in the decompressed bladder.  An 07/2024 renal artery duplex showed L RAS and cardiology (who obtained the study) asked her to d/w her vascular surgeon though I don't see where this was discussed.   PMH: 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/14 60.4Gy/11fx  and chemo   Anxiety    Aortoiliac occlusive disease (HCC) 08/14/2017   Arthritis    B12 deficiency anemia 09/14/2015   Blood transfusion without reported diagnosis    BPPV (benign paroxysmal positional vertigo) 07/08/2015   Cardiomyopathy (HCC) 11/03/2017   Chronic back pain    Chronic daily headache 03/29/2013   takes bc powder   Closed right hip fracture (HCC) 09/10/2015   Coronary artery disease involving native coronary artery of native heart without angina pectoris 10/13/2017   DES to mid RCA   Depression    Essential (hemorrhagic) thrombocythemia (HCC) 09/06/2018   Essential hypertension 09/25/2023   Family history of early CAD 04/08/2016   GERD (gastroesophageal reflux disease)    History of hiatal hernia    Hot flashes    Hyperlipidemia    Hyperlipidemia associated with type 2 diabetes mellitus (HCC) 05/11/2017   Hypothyroidism 09/10/2015   IBS (irritable bowel syndrome) 03/29/2013   Neck pain 07/08/2015   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   takes neurotin   Peripheral vascular disease 11/03/2017   QT prolongation    Sacroiliitis 09/06/2018   Spasms of the hands or feet 10/08/2017   Syncope 06/07/2016   Tubular adenoma of colon 09/08/2003   Vertigo    Wears glasses    PSH: Past Surgical History:  Procedure Laterality Date   ABDOMINAL AORTOGRAM N/A 08/02/2017   Procedure: ABDOMINAL AORTOGRAM;  Surgeon: Darron Deatrice LABOR, MD;  [facility];  Service: Cardiovascular;  Laterality: N/A;   ABDOMINAL AORTOGRAM W/LOWER  EXTREMITY Bilateral 09/03/2024   Procedure: ABDOMINAL AORTOGRAM W/LOWER EXTREMITY;  Surgeon: Serene Gaile ORN, MD;  [facility]: [facility];  Service: Cardiovascular;  Laterality: Bilateral;   AORTA - BILATERAL FEMORAL ARTERY BYPASS GRAFT N/A 08/14/2017   Procedure: AORTA BIFEMORAL BYPASS GRAFT;  Surgeon: Oris Krystal FALCON, MD;  Location: [facility] OR;  Service: Vascular;  Laterality: N/A;   COLONOSCOPY     COLONOSCOPY N/A 08/29/2024   Procedure:  COLONOSCOPY;  Surgeon: Wilhelmenia Aloha Raddle., MD;  Location: [facility];  Service: Gastroenterology;  Laterality: N/A;   CORONARY STENT INTERVENTION N/A 10/13/2017   Procedure: CORONARY STENT INTERVENTION;  Surgeon: Mady Bruckner, MD;  [facility];  Service: Cardiovascular;  Laterality: N/A;   dental implant     ECTOPIC PREGNANCY SURGERY     EMBOLECTOMY N/A 08/14/2017   Procedure: EMBOLECTOMY FEMORAL;  Surgeon: Oris Krystal FALCON, MD;  Location: [facility] OR;  Service: Vascular;  Laterality: N/A;   FEMORAL-POPLITEAL BYPASS GRAFT Right 08/14/2017   Procedure: Right Femoral to Above Knee Popliteal Bypass Graft using Non-Reversed Greater Saphenous Vein Graft from Right Leg;  Surgeon: Oris Krystal FALCON, MD;  Location: [facility] OR;  Service: Vascular;  Laterality: Right;   FLEXIBLE SIGMOIDOSCOPY N/A 08/14/2013   Procedure: FLEXIBLE SIGMOIDOSCOPY;  Surgeon: Gwendlyn ONEIDA Buddy, MD;  Location: [facility];  Service: Endoscopy;  Laterality: N/A;   LAPAROTOMY N/A 10/24/2024   Procedure: EXPLORATORY LAPAROTOMY, ABDOMINAL PACKING AND WOUND VAC APPLICATION;  Surgeon: Debby Hila, MD;  Location: [facility];  Service: General;  Laterality: N/A;  (8) EIGHT 18X18 LAP SPONGE PACKED   LEFT HEART CATH AND CORONARY ANGIOGRAPHY N/A 10/13/2017   Procedure: LEFT HEART CATH AND CORONARY ANGIOGRAPHY;  Surgeon: Mady Bruckner, MD;  [facility];  Service: Cardiovascular;  Laterality: N/A;   LOWER EXTREMITY ANGIOGRAPHY Bilateral 08/02/2017   Procedure: Lower Extremity Angiography;  Surgeon: Darron Deatrice LABOR, MD;  Location: [facility] INVASIVE CV LAB;  Service: Cardiovascular;  Laterality: Bilateral;   LOWER EXTREMITY INTERVENTION N/A 09/03/2024   Procedure: LOWER EXTREMITY INTERVENTION;  Surgeon: Serene Gaile ORN, MD;  [facility];  Service: Cardiovascular;  Laterality: N/A;   MULTIPLE TOOTH EXTRACTIONS     PERIPHERAL VASCULAR INTERVENTION  05/22/2018   Procedure: PERIPHERAL VASCULAR INTERVENTION;   Surgeon: Serene Gaile ORN, MD;  [facility];  Service: Cardiovascular;;  SMA and Celiac   PILONIDAL CYST EXCISION     POLYPECTOMY     THROMBECTOMY FEMORAL ARTERY Right 08/14/2017   Procedure: THROMBECTOMY FEMORAL ARTERY;  Surgeon: Oris Krystal FALCON, MD;  Location: [facility] OR;  Service: Vascular;  Laterality: Right;   TONSILLECTOMY     TOTAL HIP ARTHROPLASTY  09/11/2015   Procedure: TOTAL HIP ARTHROPLASTY;  Surgeon: Evalene JONETTA Chancy, MD;  [facility];  Service: Orthopedics;;   ULTRASOUND GUIDANCE FOR VASCULAR ACCESS  10/13/2017   Procedure: Ultrasound Guidance For Vascular Access;  Surgeon: Mady Bruckner, MD;  [facility];  Service: Cardiovascular;;   VENTRAL HERNIA REPAIR N/A 10/23/2024   Procedure: REPAIR, HERNIA, VENTRAL, LAPAROSCOPIC;  Surgeon: Rubin Calamity, MD;  [facility];  Service: General;  Laterality: N/A;   VISCERAL ANGIOGRAPHY N/A 03/03/2020   Procedure: MESTENRIC ANGIOGRAPHY;  Surgeon: Serene Gaile ORN, MD;  [facility];  Service: Cardiovascular;  Laterality: N/A;   VISCERAL ANGIOGRAPHY N/A 09/03/2024   Procedure: VISCERAL ANGIOGRAPHY;  Surgeon: Serene Gaile ORN, MD;  [facility];  Service: Cardiovascular;  Laterality: N/A;  VISCERAL ARTERY INTERVENTION N/A 09/03/2024   Procedure: VISCERAL ARTERY INTERVENTION;  Surgeon: Serene Gaile ORN, MD;  [facility];  Service: Cardiovascular;  Laterality: N/A;    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/14 60.4Gy/28fx and chemo   Anxiety    Aortoiliac occlusive disease (HCC) 08/14/2017   Arthritis    B12 deficiency anemia 09/14/2015   Blood transfusion without reported diagnosis    BPPV (benign paroxysmal positional vertigo) 07/08/2015   Cardiomyopathy (HCC) 11/03/2017   Chronic back pain    Chronic daily headache 03/29/2013   takes bc  powder   Closed right hip fracture (HCC) 09/10/2015   Coronary artery disease involving native coronary artery of native heart without angina pectoris 10/13/2017   DES to mid RCA   Depression    Essential (hemorrhagic) thrombocythemia (HCC) 09/06/2018   Essential hypertension 09/25/2023   Family history of early CAD 04/08/2016   GERD (gastroesophageal reflux disease)    History of hiatal hernia    Hot flashes    Hyperlipidemia    Hyperlipidemia associated with type 2 diabetes mellitus (HCC) 05/11/2017   Hypothyroidism 09/10/2015   IBS (irritable bowel syndrome) 03/29/2013   Neck pain 07/08/2015   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   takes neurotin   Peripheral vascular disease 11/03/2017   QT prolongation    Sacroiliitis 09/06/2018   Spasms of the hands or feet 10/08/2017   Syncope 06/07/2016   Tubular adenoma of colon 09/08/2003   Vertigo    Wears glasses     Medications:  I have reviewed the patient's current medications.  Medications Prior to Admission  Medication Sig Dispense Refill   acetaminophen  (TYLENOL ) 500 MG tablet Take 1,000 mg by mouth every 6 (six) hours as needed for headache (pain).     acyclovir  ointment (ZOVIRAX ) 5 % Apply 1 Application topically every 3 (three) hours as needed (fever blisters). 15 g 3   amLODipine  (NORVASC ) 5 MG tablet Take 10 mg by mouth daily.     aspirin  EC 81 MG tablet Take 1 tablet (81 mg total) by mouth daily. Swallow whole.     carvedilol  (COREG ) 12.5 MG tablet Take 12.5 mg by mouth 2 (two) times daily.     Cholecalciferol (VITAMIN D ) 2000 units tablet Take 2,000 Units by mouth daily.     clopidogrel  (PLAVIX ) 75 MG tablet Take 1 tablet (75 mg total) by mouth daily. 30 tablet 11   DULoxetine  (CYMBALTA ) 20 MG capsule TAKE 2-3 CAPSULES BY MOUTH EVERY DAY 270 capsule 1   fluticasone  (FLONASE ) 50 MCG/ACT nasal spray SPRAY 1 SPRAY INTO EACH NOSTRIL DAILY 96 mL 1   gabapentin  (NEURONTIN ) 600 MG tablet TAKE 1 TABLET BY MOUTH THREE TIMES A DAY 90  tablet 3   ketoconazole  (NIZORAL ) 2 % shampoo Apply to hair daily for 7 days -- leave on for 5 minutes, then wash every other day for 7 days, then wash twice a week for 7 days,  then once a week. 120 mL 5   levothyroxine  (SYNTHROID ) 75 MCG tablet TAKE 1 TABLET BY MOUTH DAILY BEFORE BREAKFAST MONDAY-FRIDAY, THEN 1/2 SATURDAY AND SUNDAY 84 tablet 0   metoCLOPramide  (REGLAN ) 5 MG tablet TAKE 1 TABLET BY MOUTH EVERY 6 HOURS AS NEEDED FOR NAUSEA. 60 tablet 0   nitroGLYCERIN  (NITROSTAT ) 0.4 MG SL tablet Place 1 tablet (0.4 mg total) under the tongue every 5 (five)  minutes as needed for chest pain. 25 tablet 3   nystatin -triamcinolone  (MYCOLOG II) cream Apply 1 application topically 2 (two) times daily as needed (dry skin).   2   pantoprazole  (PROTONIX ) 40 MG tablet TAKE 1 TABLET BY MOUTH TWICE A DAY 180 tablet 3   potassium chloride  SA (KLOR-CON  M20) 20 MEQ tablet Take 1 tablet (20 mEq total) by mouth 2 (two) times daily. 180 tablet 3   rOPINIRole  (REQUIP ) 1 MG tablet Take 1 tablet (1 mg total) by mouth at bedtime. May Increase to 2 tablet at bedtime after 7 days if needed. 180 tablet 1   rosuvastatin  (CRESTOR ) 40 MG tablet Take 1 tablet (40 mg total) by mouth daily. 90 tablet 1   tiZANidine (ZANAFLEX) 4 MG tablet Take 2 mg by mouth 3 (three) times daily.     topiramate  (TOPAMAX ) 50 MG tablet Take 50 mg by mouth 2 (two) times daily.     vitamin B-12 1000 MCG tablet Take 1 tablet (1,000 mcg total) by mouth daily. 30 tablet 0   hydrALAZINE  (APRESOLINE ) 25 MG tablet Take 1 tab once daily as needed for elevated BP (>160/95)     meclizine  (ANTIVERT ) 25 MG tablet TAKE 1 TABLET BY MOUTH THREE TIMES A DAY AS NEEDED FOR DIZZINESS 90 tablet 1    ALLERGIES:   Allergies  Allergen Reactions   Latex Itching    Must use paper tape   Amitiza  [Lubiprostone ] Diarrhea    Uncontrollable diarrhea   Nortriptyline  Other (See Comments)    Stomach distention   Sulfa Antibiotics Nausea And Vomiting and Other (See  Comments)    GI distress/pain   Tramadol  Itching and Rash   Atorvastatin  Nausea And Vomiting   Claritin [Loratadine] Other (See Comments)    Hot flashes    FAM HX: Family History  Problem Relation Age of Onset   Dementia Mother    Arthritis Mother    Hyperlipidemia Mother    Heart disease Mother    Hypertension Mother    Stroke Mother 62   Irritable bowel syndrome Mother    Thyroid  disease Mother    Heart attack Mother    Heart disease Father    Hyperlipidemia Father    Hypertension Father    Stroke Father 52   Thyroid  disease Father    Prostate cancer Father    Heart attack Father    Lung cancer Brother 50   Heart attack Maternal Uncle    Heart attack Maternal Grandfather    Colon cancer Neg Hx    Rectal cancer Neg Hx    Stomach cancer Neg Hx    Esophageal cancer Neg Hx     Social History:   reports that she quit smoking about 10 years ago. Her smoking use included cigarettes. She has never used smokeless tobacco. She reports that she does not drink alcohol and does not use drugs.  ROS: unable to obtain given patient status  Blood pressure 123/71, pulse 79, temperature (!) 100.9 F (38.3 C), resp. rate 14, height 5' 3 (1.6 m), weight 44.4 kg, SpO2 92%. PHYSICAL EXAM: Gen: thii woman intubated on vent  ENT:ETT in place Neck: L internal jugular triple lumen central line CV: RRR Abd:  abd binder in place Lungs: coarse ant on vent GU: foley draining normal yellow urine Extr: no edema Neuro:sedated   Results for orders placed or performed during the hospital encounter of 10/23/24 (from the past 48 hours)  I-STAT, chem 8     Status: Abnormal  Collection Time: 10/23/24  2:20 PM  Result Value Ref Range   Sodium 140 135 - 145 mmol/L   Potassium 4.4 3.5 - 5.1 mmol/L   Chloride 107 98 - 111 mmol/L   BUN 20 8 - 23 mg/dL   Creatinine, Ser 8.29 (H) 0.44 - 1.00 mg/dL   Glucose, Bld 813 (H) 70 - 99 mg/dL    Comment: Glucose reference range applies only to samples  taken after fasting for at least 8 hours.   Calcium , Ion 1.20 1.15 - 1.40 mmol/L   TCO2 20 (L) 22 - 32 mmol/L   Hemoglobin 5.4 (LL) 12.0 - 15.0 g/dL   HCT 83.9 (L) 63.9 - 53.9 %   Comment NOTIFIED PHYSICIAN   CBC     Status: Abnormal   Collection Time: 10/23/24  2:36 PM  Result Value Ref Range   WBC 10.2 4.0 - 10.5 K/uL   RBC 1.37 (L) 3.87 - 5.11 MIL/uL   Hemoglobin 4.3 (LL) 12.0 - 15.0 g/dL    Comment: This critical result has been called to twila marla Peak by Freddi, K on 10/23/24 @ 1540 by Darice Freddi Hong on 10/23/2024 15:38:52, and has been read back.   HCT 13.8 (L) 36.0 - 46.0 %   MCV 100.7 (H) 80.0 - 100.0 fL   MCH 31.4 26.0 - 34.0 pg   MCHC 31.2 30.0 - 36.0 g/dL   RDW 86.7 88.4 - 84.4 %   Platelets 142 (L) 150 - 400 K/uL   nRBC 0.0 0.0 - 0.2 %    Comment: Performed at [facility], [address], [place], [place] [address]  Type and screen [facility]     Status: None (Preliminary result)   Collection Time: 10/23/24  2:36 PM  Result Value Ref Range   ABO/RH(D) A POS    Antibody Screen NEG    Sample Expiration 10/26/2024,2359    Unit Number [id number]    Blood Component Type RED CELLS,LR    Unit division 00    Status of Unit ISSUED,FINAL    Transfusion Status OK TO TRANSFUSE    Crossmatch Result Compatible    Unit Number [id number]    Blood Component Type RED CELLS,LR    Unit division 00    Status of Unit ISSUED,FINAL    Transfusion Status OK TO TRANSFUSE    Crossmatch Result Compatible    Unit Number [id number]    Blood Component Type RED CELLS,LR    Unit division 00    Status of Unit ISSUED,FINAL    Transfusion Status OK TO TRANSFUSE    Crossmatch Result      Compatible Performed at [facility], [address], [place], [place] [address]    Unit Number [id number]    Blood Component Type RED CELLS,LR    Unit division 00    Status of Unit ISSUED,FINAL    Transfusion Status OK TO  TRANSFUSE    Crossmatch Result Compatible    Unit Number [id number]    Blood Component Type RED CELLS,LR    Unit division 00    Status of Unit DISCARDED    Transfusion Status OK TO TRANSFUSE    Crossmatch Result Compatible    Unit Number [id number]    Blood Component Type RED CELLS,LR    Unit division 00    Status of Unit ISSUED,FINAL    Transfusion Status OK TO TRANSFUSE    Crossmatch Result Compatible    Unit Number [id number]  Blood Component Type RED CELLS,LR    Unit division 00    Status of Unit ISSUED,FINAL    Transfusion Status OK TO TRANSFUSE    Crossmatch Result Compatible    Unit Number [id number]    Blood Component Type RED CELLS,LR    Unit division 00    Status of Unit ALLOCATED    Transfusion Status OK TO TRANSFUSE    Crossmatch Result Compatible    Unit Number [id number]    Blood Component Type RED CELLS,LR    Unit division 00    Status of Unit ALLOCATED    Transfusion Status OK TO TRANSFUSE    Crossmatch Result Compatible    Unit Number [id number]    Blood Component Type RED CELLS,LR    Unit division 00    Status of Unit ALLOCATED    Transfusion Status OK TO TRANSFUSE    Crossmatch Result Compatible    Unit Number [id number]    Blood Component Type RED CELLS,LR    Unit division 00    Status of Unit ALLOCATED    Transfusion Status OK TO TRANSFUSE    Crossmatch Result Compatible   Prepare RBC (crossmatch) INTRAOP ONLY     Status: None   Collection Time: 10/23/24  2:38 PM  Result Value Ref Range   Order Confirmation      ORDER PROCESSED BY BLOOD BANK Performed at [facility], [address] [place], [place] [address]   Glucose, capillary     Status: Abnormal   Collection Time: 10/23/24  3:18 PM  Result Value Ref Range   Glucose-Capillary 149 (H) 70 - 99 mg/dL    Comment: Glucose reference range applies only to samples taken after fasting for at least 8 hours.  CBC with Differential/Platelet     Status:  Abnormal   Collection Time: 10/23/24  4:36 PM  Result Value Ref Range   WBC 11.7 (H) 4.0 - 10.5 K/uL   RBC 2.83 (L) 3.87 - 5.11 MIL/uL   Hemoglobin 8.1 (L) 12.0 - 15.0 g/dL    Comment: REPEATED TO VERIFY POST TRANSFUSION SPECIMEN DELTA CHECK NOTED    HCT 25.2 (L) 36.0 - 46.0 %   MCV 89.0 80.0 - 100.0 fL    Comment: DELTA CHECK NOTED REPEATED TO VERIFY POST TRANSFUSION SPECIMEN    MCH 28.6 26.0 - 34.0 pg   MCHC 32.1 30.0 - 36.0 g/dL   RDW 83.9 (H) 88.4 - 84.4 %   Platelets 117 (L) 150 - 400 K/uL   nRBC 0.0 0.0 - 0.2 %   Neutrophils Relative % 86 %   Neutro Abs 10.1 (H) 1.7 - 7.7 K/uL   Lymphocytes Relative 6 %   Lymphs Abs 0.7 0.7 - 4.0 K/uL   Monocytes Relative 7 %   Monocytes Absolute 0.8 0.1 - 1.0 K/uL   Eosinophils Relative 0 %   Eosinophils Absolute 0.0 0.0 - 0.5 K/uL   Basophils Relative 0 %   Basophils Absolute 0.0 0.0 - 0.1 K/uL   Immature Granulocytes 1 %   Abs Immature Granulocytes 0.07 0.00 - 0.07 K/uL    Comment: Performed at [facility], [address] [place], [place] [address]  Lactic acid, plasma     Status: Abnormal   Collection Time: 10/23/24 10:33 PM  Result Value Ref Range   Lactic Acid, Venous 4.7 (HH) 0.5 - 1.9 mmol/L    Comment: Critical Value, Read Back and verified with CANDIE REX, RN 10/23/24 2350 J.COLE Performed at  [facility], [address] [place], [place] [address]   Magnesium      Status: Abnormal   Collection Time: 10/23/24 10:33 PM  Result Value Ref Range   Magnesium  1.6 (L) 1.7 - 2.4 mg/dL    Comment: Performed at [facility], [address] [place], [place] [address]  Comprehensive metabolic panel     Status: Abnormal   Collection Time: 10/23/24 10:33 PM  Result Value Ref Range   Sodium 139 135 - 145 mmol/L   Potassium 5.3 (H) 3.5 - 5.1 mmol/L   Chloride 115 (H) 98 - 111 mmol/L   CO2 14 (L) 22 - 32 mmol/L   Glucose, Bld 203 (H) 70 - 99 mg/dL    Comment: Glucose  reference range applies only to samples taken after fasting for at least 8 hours.   BUN 22 8 - 23 mg/dL   Creatinine, Ser 8.18 (H) 0.44 - 1.00 mg/dL   Calcium  5.7 (LL) 8.9 - 10.3 mg/dL    Comment: Critical Value, Read Back and verified with CANDIE REX, RN 10/23/24 2350 J.COLE   Total Protein <3.0 (L) 6.5 - 8.1 g/dL   Albumin  2.0 (L) 3.5 - 5.0 g/dL   AST 43 (H) 15 - 41 U/L   ALT 26 0 - 44 U/L   Alkaline Phosphatase 32 (L) 38 - 126 U/L   Total Bilirubin <0.2 0.0 - 1.2 mg/dL   GFR, Estimated 30 (L) >60 mL/min    Comment: (NOTE) Calculated using the CKD-EPI Creatinine Equation (2021)    Anion gap 11 5 - 15    Comment: Performed at [facility], [address], [place], [place] [address]  CBC     Status: Abnormal   Collection Time: 10/23/24 10:33 PM  Result Value Ref Range   WBC 13.4 (H) 4.0 - 10.5 K/uL   RBC 1.31 (L) 3.87 - 5.11 MIL/uL   Hemoglobin 3.8 (LL) 12.0 - 15.0 g/dL    Comment: REPEATED TO VERIFY This critical result has been called to K.CLEVELAND, RN by Rankin Hummer on 10/23/2024 23:21:41, and has been read back. CRITICAL RESULT VERIFIED.    HCT 11.8 (L) 36.0 - 46.0 %   MCV 90.1 80.0 - 100.0 fL   MCH 29.0 26.0 - 34.0 pg   MCHC 32.2 30.0 - 36.0 g/dL   RDW 82.3 (H) 88.4 - 84.4 %   Platelets 75 (L) 150 - 400 K/uL    Comment: SPECIMEN CHECKED FOR CLOTS REPEATED TO VERIFY PLATELET COUNT CONFIRMED BY SMEAR Immature Platelet Fraction may be clinically indicated, consider ordering this additional test [id number]    nRBC 0.0 0.0 - 0.2 %    Comment: Performed at [facility], [address], [place], [place] [address]  DIC Panel ONCE - STAT     Status: Abnormal   Collection Time: 10/23/24 10:34 PM  Result Value Ref Range   Prothrombin Time 22.4 (H) 11.4 - 15.2 seconds   INR 1.9 (H) 0.8 - 1.2    Comment: (NOTE) INR goal varies based on device and disease states.    aPTT 37 (H) 24 - 36 seconds    Comment:        IF BASELINE aPTT IS  ELEVATED, SUGGEST PATIENT RISK ASSESSMENT BE USED TO DETERMINE APPROPRIATE ANTICOAGULANT THERAPY.    Fibrinogen 95 (LL) 210 - 475 mg/dL    Comment: CRITICAL RESULT CALLED TO, READ BACK BY AND VERIFIED WITH: K.CLEVELAND, RN @2334  10/23/24 BY N.THOMPSON (NOTE) Fibrinogen  results may be underestimated in patients receiving thrombolytic therapy.    D-Dimer, Quant 1.80 (H) 0.00 - 0.50 ug/mL-FEU    Comment: (NOTE) At the manufacturer cut-off value of 0.5 g/mL FEU, this assay has a negative predictive value of 95-100%.This assay is intended for use in conjunction with a clinical pretest probability (PTP) assessment model to exclude pulmonary embolism (PE) and deep venous thrombosis (DVT) in outpatients suspected of PE or DVT. Results should be correlated with clinical presentation. CORRECTED ON 12/03 AT 2335: PREVIOUSLY REPORTED AS 1.80    Platelets 75 (L) 150 - 400 K/uL    Comment: SPECIMEN CHECKED FOR CLOTS PLATELET COUNT CONFIRMED BY SMEAR REPEATED TO VERIFY Immature Platelet Fraction may be clinically indicated, consider ordering this additional test [id number]    Smear Review NO SCHISTOCYTES SEEN     Comment: Performed at [facility], [address] [place], [place] [address]  Prepare RBC (crossmatch)     Status: None   Collection Time: 10/23/24 11:40 PM  Result Value Ref Range   Order Confirmation      ORDER PROCESSED BY BLOOD BANK Performed at [facility], [address], [place], [place] [address]   Prepare fresh frozen plasma     Status: None   Collection Time: 10/23/24 11:42 PM  Result Value Ref Range   Unit Number [id number]    Blood Component Type FP24 PHR THW    Unit division A0    Status of Unit ISSUED,FINAL    Transfusion Status OK TO TRANSFUSE    Unit Number [id number]    Blood Component Type FP24 PHR THW    Unit division 00    Status of Unit ISSUED,FINAL    Transfusion Status      OK TO TRANSFUSE Performed at  [facility], [address], [place], [place] [address]   Prepare cryoprecipitate     Status: None   Collection Time: 10/23/24 11:42 PM  Result Value Ref Range   Unit Number [id number]    Blood Component Type CRYO,THAWED    Unit division 00    Status of Unit ISSUED,FINAL    Transfusion Status OK TO TRANSFUSE    Unit Number [id number]    Blood Component Type CRYO,THAWED    Unit division 00    Status of Unit ISSUED,FINAL    Transfusion Status      OK TO TRANSFUSE Performed at [facility], [address] [place], [place] [address]   Prepare RBC (crossmatch)     Status: None   Collection Time: 10/24/24  2:05 AM  Result Value Ref Range   Order Confirmation      ORDER PROCESSED BY BLOOD BANK Performed at [facility] [facility], [address], [place], [place] [address]   Prepare fresh frozen plasma     Status: None (Preliminary result)   Collection Time: 10/24/24  2:06 AM  Result Value Ref Range   Unit Number [id number]    Blood Component Type LIQ PLASMA    Unit division 00    Status of Unit ALLOCATED    Transfusion Status OK TO TRANSFUSE    Unit Number [id number]    Blood Component Type LIQ PLASMA    Unit division 00    Status of Unit ALLOCATED    Transfusion Status OK TO TRANSFUSE   Prepare platelet pheresis     Status: None (Preliminary result)   Collection Time: 10/24/24  2:07 AM  Result Value Ref Range  Unit Number [id number]    Blood Component Type PLTP3 PSORALEN TREATED    Unit division 00    Status of Unit REL FROM [facility]    Transfusion Status OK TO TRANSFUSE    Unit Number [id number]    Blood Component Type PLTP1 PSORALEN TREATED    Unit division 00    Status of Unit REL FROM [facility]    Transfusion Status OK TO TRANSFUSE    Unit Number [id number]    Blood Component Type PLTP1 PSORALEN TREATED    Unit division 00    Status of Unit REL FROM [facility]    Transfusion Status      OK TO  TRANSFUSE Performed at [facility], [address], [place], [place] [address]    Unit Number [id number]    Blood Component Type PLTP1 PSORALEN TREATED    Unit division 00    Status of Unit ALLOCATED    Transfusion Status OK TO TRANSFUSE   CBC     Status: Abnormal   Collection Time: 10/24/24  6:10 AM  Result Value Ref Range   WBC 7.4 4.0 - 10.5 K/uL   RBC 3.72 (L) 3.87 - 5.11 MIL/uL   Hemoglobin 11.4 (L) 12.0 - 15.0 g/dL    Comment: REPEATED TO VERIFY POST TRANSFUSION SPECIMEN    HCT 31.4 (L) 36.0 - 46.0 %   MCV 84.4 80.0 - 100.0 fL   MCH 30.6 26.0 - 34.0 pg   MCHC 36.3 (H) 30.0 - 36.0 g/dL   RDW 86.1 88.4 - 84.4 %   Platelets 26 (LL) 150 - 400 K/uL    Comment: SPECIMEN CHECKED FOR CLOTS PLATELET COUNT CONFIRMED BY SMEAR Immature Platelet Fraction may be clinically indicated, consider ordering this additional test [id number] This critical result has been called to CARMICHAEL, L by YASMIN Budge on 10/24/2024 09:07:44, and has been read back.    nRBC 0.0 0.0 - 0.2 %    Comment: Performed at [facility], [address] [place], [place] [address]  DIC Panel Now Then Q8H     Status: Abnormal   Collection Time: 10/24/24  6:10 AM  Result Value Ref Range   Prothrombin Time 18.3 (H) 11.4 - 15.2 seconds   INR 1.4 (H) 0.8 - 1.2    Comment: (NOTE) INR goal varies based on device and disease states.    aPTT 35 24 - 36 seconds   Fibrinogen 268 210 - 475 mg/dL    Comment: (NOTE) Fibrinogen results may be underestimated in patients receiving thrombolytic therapy.    D-Dimer, Quant 2.21 (H) 0.00 - 0.50 ug/mL-FEU    Comment: (NOTE) At the manufacturer cut-off value of 0.5 g/mL FEU, this assay has a negative predictive value of 95-100%.This assay is intended for use in conjunction with a clinical pretest probability (PTP) assessment model to exclude pulmonary embolism (PE) and deep venous thrombosis (DVT) in outpatients suspected of PE or  DVT. Results should be correlated with clinical presentation.    Platelets 26 (LL) 150 - 400 K/uL    Comment: SPECIMEN CHECKED FOR CLOTS PLATELET COUNT CONFIRMED BY SMEAR Immature Platelet Fraction may be clinically indicated, consider ordering this additional test [id number] This critical result has been called to CARMICHAEL, L by YASMIN Budge on 10/24/2024 09:07:01, and has been read back.    Smear Review NO SCHISTOCYTES SEEN     Comment: Performed at [facility], [address] [place], [place] [address]  Lactic acid, plasma  Status: None   Collection Time: 10/24/24  6:10 AM  Result Value Ref Range   Lactic Acid, Venous 1.8 0.5 - 1.9 mmol/L    Comment: Performed at [facility], [address], [place], [place] [address]  Comprehensive metabolic panel     Status: Abnormal   Collection Time: 10/24/24  6:10 AM  Result Value Ref Range   Sodium 140 135 - 145 mmol/L   Potassium 3.8 3.5 - 5.1 mmol/L    Comment: Delta check noted  Repeated to verify     Chloride 106 98 - 111 mmol/L   CO2 22 22 - 32 mmol/L   Glucose, Bld 130 (H) 70 - 99 mg/dL    Comment: Glucose reference range applies only to samples taken after fasting for at least 8 hours.   BUN 22 8 - 23 mg/dL   Creatinine, Ser 8.06 (H) 0.44 - 1.00 mg/dL   Calcium  6.8 (L) 8.9 - 10.3 mg/dL   Total Protein 3.8 (L) 6.5 - 8.1 g/dL   Albumin  2.6 (L) 3.5 - 5.0 g/dL   AST 553 (H) 15 - 41 U/L   ALT 269 (H) 0 - 44 U/L   Alkaline Phosphatase 45 38 - 126 U/L   Total Bilirubin 0.3 0.0 - 1.2 mg/dL   GFR, Estimated 28 (L) >60 mL/min    Comment: (NOTE) Calculated using the CKD-EPI Creatinine Equation (2021)    Anion gap 12 5 - 15    Comment: Performed at [facility], [address], [place], [place] [address]  Glucose, capillary     Status: Abnormal   Collection Time: 10/24/24  8:38 AM  Result Value Ref Range   Glucose-Capillary 120 (H) 70 - 99 mg/dL    Comment: Glucose  reference range applies only to samples taken after fasting for at least 8 hours.  Hemoglobin and hematocrit, blood     Status: Abnormal   Collection Time: 10/24/24 11:39 AM  Result Value Ref Range   Hemoglobin 11.7 (L) 12.0 - 15.0 g/dL   HCT 67.2 (L) 63.9 - 53.9 %    Comment: Performed at [facility], [address] [place], [place] [address]  DIC Panel Now Then Q8H     Status: Abnormal   Collection Time: 10/24/24  3:00 PM  Result Value Ref Range   Prothrombin Time 16.0 (H) 11.4 - 15.2 seconds   INR 1.2 0.8 - 1.2    Comment: (NOTE) INR goal varies based on device and disease states.    aPTT 32 24 - 36 seconds   Fibrinogen 295 210 - 475 mg/dL    Comment: (NOTE) Fibrinogen results may be underestimated in patients receiving thrombolytic therapy.    D-Dimer, Quant 3.65 (H) 0.00 - 0.50 ug/mL-FEU    Comment: (NOTE) At the manufacturer cut-off value of 0.5 g/mL FEU, this assay has a negative predictive value of 95-100%.This assay is intended for use in conjunction with a clinical pretest probability (PTP) assessment model to exclude pulmonary embolism (PE) and deep venous thrombosis (DVT) in outpatients suspected of PE or DVT. Results should be correlated with clinical presentation.    Platelets 34 (L) 150 - 400 K/uL    Comment: Immature Platelet Fraction may be clinically indicated, consider ordering this additional test [id number]    Smear Review NO SCHISTOCYTES SEEN     Comment: Performed at [facility], [address] [place], [place] [address]  CBC     Status: Abnormal  Collection Time: 10/24/24  3:00 PM  Result Value Ref Range   WBC 8.8 4.0 - 10.5 K/uL   RBC 3.83 (L) 3.87 - 5.11 MIL/uL   Hemoglobin 11.6 (L) 12.0 - 15.0 g/dL   HCT 67.7 (L) 63.9 - 53.9 %   MCV 84.1 80.0 - 100.0 fL   MCH 30.3 26.0 - 34.0 pg   MCHC 36.0 30.0 - 36.0 g/dL   RDW 85.3 88.4 - 84.4 %   Platelets 36 (L) 150 - 400 K/uL    Comment: SPECIMEN CHECKED FOR  CLOTS PLATELET COUNT CONFIRMED BY SMEAR Immature Platelet Fraction may be clinically indicated, consider ordering this additional test [id number]    nRBC 0.0 0.0 - 0.2 %    Comment: Performed at [facility], [address] [place], [place] [address]  Hemoglobin and hematocrit, blood     Status: Abnormal   Collection Time: 10/24/24  7:40 PM  Result Value Ref Range   Hemoglobin 10.8 (L) 12.0 - 15.0 g/dL   HCT 70.1 (L) 63.9 - 53.9 %    Comment: Performed at [facility], [address], [place], [place] [address]  Glucose, capillary     Status: Abnormal   Collection Time: 10/24/24  7:46 PM  Result Value Ref Range   Glucose-Capillary 107 (H) 70 - 99 mg/dL    Comment: Glucose reference range applies only to samples taken after fasting for at least 8 hours.  Blood gas, arterial     Status: Abnormal   Collection Time: 10/24/24  9:44 PM  Result Value Ref Range   FIO2 30 %   Delivery systems VENTILATOR    Mode PRESSURE REGULATED VOLUME CONTROL    MECHVT 420 mL   RATE 14 resp/min   PEEP 5 cm H20   pH, Arterial 7.38 7.35 - 7.45   pCO2 arterial 46 32 - 48 mmHg   pO2, Arterial 76 (L) 83 - 108 mmHg   Bicarbonate 27.3 20.0 - 28.0 mmol/L   Acid-Base Excess 2.1 (H) 0.0 - 2.0 mmol/L   O2 Saturation 97.4 %   Patient temperature 38.1    Collection site A-LINE    Drawn by [id number]     Comment: Performed at [facility], [address], [place], [place] [address]  DIC Panel Now Then Q8H     Status: Abnormal   Collection Time: 10/24/24  9:51 PM  Result Value Ref Range   Prothrombin Time 16.5 (H) 11.4 - 15.2 seconds   INR 1.3 (H) 0.8 - 1.2    Comment: (NOTE) INR goal varies based on device and disease states.    aPTT 34 24 - 36 seconds   Fibrinogen 422 210 - 475 mg/dL    Comment: (NOTE) Fibrinogen results may be underestimated in patients receiving thrombolytic therapy.    D-Dimer, Quant 4.51 (H) 0.00 - 0.50 ug/mL-FEU    Comment:  (NOTE) At the manufacturer cut-off value of 0.5 g/mL FEU, this assay has a negative predictive value of 95-100%.This assay is intended for use in conjunction with a clinical pretest probability (PTP) assessment model to exclude pulmonary embolism (PE) and deep venous thrombosis (DVT) in outpatients suspected of PE or DVT. Results should be correlated with clinical presentation.    Platelets 38 (L) 150 - 400 K/uL    Comment: Immature Platelet Fraction may be clinically indicated, consider ordering this additional test [id number]    Smear Review NO SCHISTOCYTES SEEN     Comment: Performed at [facility], [address], [place], [place] [address]  Glucose, capillary     Status: Abnormal   Collection Time: 10/24/24 11:09 PM  Result Value Ref Range   Glucose-Capillary 116 (H) 70 - 99 mg/dL    Comment: Glucose reference range applies only to samples taken after fasting for at least 8 hours.  Hemoglobin and hematocrit, blood     Status: Abnormal   Collection Time: 10/24/24 11:53 PM  Result Value Ref Range   Hemoglobin 10.9 (L) 12.0 - 15.0 g/dL   HCT 70.4 (L) 63.9 - 53.9 %    Comment: Performed at [facility], [address] [place], [place] [address]  Glucose, capillary     Status: Abnormal   Collection Time: 10/25/24  3:21 AM  Result Value Ref Range   Glucose-Capillary 142 (H) 70 - 99 mg/dL    Comment: Glucose reference range applies only to samples taken after fasting for at least 8 hours.  Hemoglobin and hematocrit, blood     Status: Abnormal   Collection Time: 10/25/24  3:50 AM  Result Value Ref Range   Hemoglobin 10.2 (L) 12.0 - 15.0 g/dL   HCT 71.7 (L) 63.9 - 53.9 %    Comment: Performed at [facility], [address], [place], [place] [address]  DIC Panel Now Then Q8H     Status: Abnormal   Collection Time: 10/25/24  5:07 AM  Result Value Ref Range   Prothrombin Time 16.6 (H) 11.4 - 15.2 seconds   INR 1.3 (H) 0.8 -  1.2    Comment: (NOTE) INR goal varies based on device and disease states.    aPTT 36 24 - 36 seconds   Fibrinogen 490 (H) 210 - 475 mg/dL    Comment: (NOTE) Fibrinogen results may be underestimated in patients receiving thrombolytic therapy.    D-Dimer, Quant 4.64 (H) 0.00 - 0.50 ug/mL-FEU    Comment: (NOTE) At the manufacturer cut-off value of 0.5 g/mL FEU, this assay has a negative predictive value of 95-100%.This assay is intended for use in conjunction with a clinical pretest probability (PTP) assessment model to exclude pulmonary embolism (PE) and deep venous thrombosis (DVT) in outpatients suspected of PE or DVT. Results should be correlated with clinical presentation.    Platelets 42 (L) 150 - 400 K/uL    Comment: SPECIMEN CHECKED FOR CLOTS CONSISTENT WITH PREVIOUS RESULT Immature Platelet Fraction may be clinically indicated, consider ordering this additional test [id number]    Smear Review NO SCHISTOCYTES SEEN     Comment: Performed at [facility], [address], [place], [place] [address]  Triglycerides     Status: Abnormal   Collection Time: 10/25/24  5:07 AM  Result Value Ref Range   Triglycerides 196 (H) <150 mg/dL    Comment: Performed at [facility], [address] [place], [place] [address]  Comprehensive metabolic panel     Status: Abnormal   Collection Time: 10/25/24  5:07 AM  Result Value Ref Range   Sodium 140 135 - 145 mmol/L   Potassium 3.2 (L) 3.5 - 5.1 mmol/L   Chloride 100 98 - 111 mmol/L   CO2 31 22 - 32 mmol/L   Glucose, Bld 117 (H) 70 - 99 mg/dL    Comment: Glucose reference range applies only to samples taken after fasting for at least 8 hours.   BUN 27 (H) 8 - 23 mg/dL   Creatinine, Ser 7.24 (H) 0.44 - 1.00 mg/dL  Calcium  6.8 (L) 8.9 - 10.3 mg/dL   Total Protein 3.8 (L) 6.5 - 8.1 g/dL   Albumin  2.3 (L) 3.5 - 5.0 g/dL   AST 673 (H) 15 - 41 U/L   ALT 111 (H) 0 - 44 U/L   Alkaline Phosphatase 52 38  - 126 U/L   Total Bilirubin <0.2 0.0 - 1.2 mg/dL   GFR, Estimated 18 (L) >60 mL/min    Comment: (NOTE) Calculated using the CKD-EPI Creatinine Equation (2021)    Anion gap 10 5 - 15    Comment: Performed at [facility], [address] [place], [place] [address]  CBC     Status: Abnormal   Collection Time: 10/25/24  5:07 AM  Result Value Ref Range   WBC 8.8 4.0 - 10.5 K/uL   RBC 3.36 (L) 3.87 - 5.11 MIL/uL   Hemoglobin 10.4 (L) 12.0 - 15.0 g/dL   HCT 71.4 (L) 63.9 - 53.9 %   MCV 84.8 80.0 - 100.0 fL   MCH 31.0 26.0 - 34.0 pg   MCHC 36.5 (H) 30.0 - 36.0 g/dL   RDW 84.9 88.4 - 84.4 %   Platelets 44 (L) 150 - 400 K/uL    Comment: SPECIMEN CHECKED FOR CLOTS CONSISTENT WITH PREVIOUS RESULT Immature Platelet Fraction may be clinically indicated, consider ordering this additional test [id number]    nRBC 0.0 0.0 - 0.2 %    Comment: Performed at [facility], [address], [place], [place] [address]  Glucose, capillary     Status: Abnormal   Collection Time: 10/25/24  9:21 AM  Result Value Ref Range   Glucose-Capillary 101 (H) 70 - 99 mg/dL    Comment: Glucose reference range applies only to samples taken after fasting for at least 8 hours.  Blood gas, venous     Status: Abnormal   Collection Time: 10/25/24 11:00 AM  Result Value Ref Range   pH, Ven 7.39 7.25 - 7.43   pCO2, Ven 52 44 - 60 mmHg   pO2, Ven 47 (H) 32 - 45 mmHg   Bicarbonate 31.5 (H) 20.0 - 28.0 mmol/L   Acid-Base Excess 5.6 (H) 0.0 - 2.0 mmol/L   O2 Saturation 76 %   Patient temperature 37.0     Comment: Performed at [facility], [address], [place], [place] [address]  Hemoglobin and hematocrit, blood     Status: Abnormal   Collection Time: 10/25/24 11:22 AM  Result Value Ref Range   Hemoglobin 9.5 (L) 12.0 - 15.0 g/dL   HCT 72.7 (L) 63.9 - 53.9 %    Comment: Performed at [facility], [address] [place], [place] [address]   Glucose, capillary     Status: None   Collection Time: 10/25/24 11:51 AM  Result Value Ref Range   Glucose-Capillary 97 70 - 99 mg/dL    Comment: Glucose reference range applies only to samples taken after fasting for at least 8 hours.   *Note: Due to a large number of results and/or encounters for the requested time period, some results have not been displayed. A complete set of results can be found in Results Review.    DG CHEST PORT 1 VIEW Result Date: 10/25/2024 EXAM: 1 VIEW(S) XRAY OF THE CHEST 10/25/2024 10:46:00 AM COMPARISON: Yesterday. CLINICAL HISTORY: [id number] History of endotracheal intubation [id number] History of endotracheal intubation FINDINGS: LINES, TUBES AND DEVICES: Endotracheal and nasogastric tubes are appropriately in good position. Left internal jugular catheter is  unchanged. LUNGS AND PLEURA: Mild right basilar atelectasis is noted. Probable small pleural effusion. No pneumothorax. HEART AND MEDIASTINUM: No acute abnormality of the cardiac and mediastinal silhouettes. BONES AND SOFT TISSUES: No acute osseous abnormality. IMPRESSION: 1. Mild right basilar atelectasis with probable small pleural effusion. Electronically signed by: Lynwood Seip MD 10/25/2024 11:21 AM EST RP Workstation: [id number]   DG Abd Portable 1V Result Date: 10/24/2024 CLINICAL DATA:  Nasogastric tube advancement. EXAM: PORTABLE ABDOMEN - 1 VIEW COMPARISON:  CT earlier today FINDINGS: Tip and side port of the enteric tube below the diaphragm in the stomach. Multiple serpiginous densities in the right hemiabdomen with air projecting over the soft tissues likely postoperative in nature. IMPRESSION: Tip and side port of the enteric tube below the diaphragm in the stomach. Electronically Signed   By: Andrea Gasman M.D.   On: 10/24/2024 11:41   DG CHEST PORT 1 VIEW Result Date: 10/24/2024 EXAM: 1 VIEW(S) XRAY OF THE CHEST 10/24/2024 06:04:00 AM COMPARISON: 10/23/2024 CLINICAL HISTORY: Intubation of airway  performed without difficulty FINDINGS: LINES, TUBES AND DEVICES: Endotracheal tube in place with tip 2.2 cm above the carina. Enteric tube in place with tip in the region of the gastroesophageal junction. Left internal jugular central venous catheter in place with tip in the superior vena cava. LUNGS AND PLEURA: Small bilateral pleural effusions. Hazy bibasilar airspace opacities, possibly representing atelectasis or pneumonia. Prominent bilateral interstitial opacities, possibly representing mild pulmonary edema or atelectasis. No pneumothorax. HEART AND MEDIASTINUM: No acute abnormality of the cardiac and mediastinal silhouettes. BONES AND SOFT TISSUES: No acute osseous abnormality. Gaseous distention of stomach. IMPRESSION: 1. Enteric tube is under-advanced with tip at the level of the ge junction. Recommend advancement. 2. Small bilateral pleural effusions, new from prior exam. 3. Prominent bilateral interstitial opacities, which are favored to reflect pulmonary edema. 4. New hazy bibasilar airspace opacities, which may reflect atelectasis or pneumonia. Electronically signed by: Waddell Calk MD 10/24/2024 06:21 AM EST RP Workstation: [id number]   CT Angio Abd/Pel w/ and/or w/o Result Date: 10/24/2024 EXAM: CTA ABDOMEN AND PELVIS WITHOUT AND WITH CONTRAST 10/24/2024 12:45:40 AM TECHNIQUE: CTA images of the abdomen and pelvis without and with intravenous contrast. Three-dimensional MIP/volume rendered formations were performed. Automated exposure control, iterative reconstruction, and/or weight based adjustment of the mA/kV was utilized to reduce the radiation dose to as low as reasonably achievable. COMPARISON: 06/18/2024 CLINICAL HISTORY: Retroperitoneal bleed suspected. Status post abdominal wall hernia repair on 10/23/2024 and recent renal artery stent for stenosis. FINDINGS: VASCULATURE: Low-density blood pool compatible with anemia. AORTA: No acute finding. No abdominal aortic aneurysm. No dissection.  CELIAC TRUNK: The celiac axis stent is patent. SUPERIOR MESENTERIC ARTERY: The SMA stent is patent. There is severe narrowing of the SMA immediately distal to the stent. INFERIOR MESENTERIC ARTERY: The IMA is not well visualized. RENAL ARTERIES: Diminutive right renal artery. Minimal enhancement of the left kidney with non-opacification of the left renal artery which appears occluded near the origin (series 13 image 94). ILIAC ARTERIES: No acute finding. Patent aortobifemoral bypass grafts. LIVER: The liver is unremarkable. GALLBLADDER AND BILE DUCTS: Gallbladder wall thickening is likely reactive and/or due to 3rd spacing of fluid. No biliary ductal dilatation. SPLEEN: The spleen is unremarkable. PANCREAS: The pancreas is unremarkable. ADRENAL GLANDS: Bilateral adrenal glands demonstrate no acute abnormality. KIDNEYS, URETERS AND BLADDER: There is a peripheral wedge-shaped area of non-enhancement in the superior pole of the right kidney compatible with infarct. Minimal enhancement of the left kidney with non-opacification of the  left renal artery which appears occluded near the origin (series 13 image 94). No stones in the kidneys or ureters. No hydronephrosis. No perinephric or periureteral stranding. Foley catheter and gas in the decompressed bladder. GI AND BOWEL: Patulous lower esophagus containing fluid. Distended stomach with air-fluid level. Wall thickening and mucosal hyperenhancement of the gastric antrum and duodenum. Wall thickening and mucosal hyperenhancement of the duodenum, jejunum as well as the cecum and ascending and transverse colon presumably due to reactive inflammation from adjacent hemorrhage. REPRODUCTIVE: No acute abnormality. PERITONEUM AND RETROPERITONEUM: Large right intraperitoneal hematoma measuring 14.9 x 8.0 x 13.2 cm. There is active bleeding from the right epigastric artery (circa series 7 image 115 and series 9 image 48). Hemorrhage tracks inferiorly into the pelvis. Tiny amount  of free intraperitoneal gas in the anterior peritoneum is likely postoperative. LUNG BASE: Bilateral pleural effusions and compressive atelectasis. LYMPH NODES: No lymphadenopathy. BONES AND SOFT TISSUES: Right hip arthroplasty. Extensive subcutaneous edema in the abdominal wall. Hemorrhage extends through a defect in the anterior peritoneum into the subcutaneous fat of the ventral abdominal wall (series 7, images 107-113). Subcutaneous emphysema is presumed postoperative. No acute abnormality of the bones. IMPRESSION: 1. Large right intraperitoneal hematoma with active bleeding from the right epigastric artery. Large volume hemorrhage throughout the abdomen and pelvis. 2. 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 3. Diminutive right renal artery with infarct in the upper pole of the right kidney. 4. Severe narrowing of the SMA immediately distal to the stent, which is patent. 5. Reactive inflammation of the small bowel colon. 6. Low-density blood pool on noncontrast images compatible with anemia. 7. Body wall anasarca. Critical value/emergent results were called by telephone at the time of interpretation on 12 / 4 / 25 at 1:10 pm to Dr. DOROTHA Na, who verbally acknowledged these results. Electronically signed by: Norman Gatlin MD 10/24/2024 01:13 AM EST RP Workstation: [id number]   DG CHEST PORT 1 VIEW Result Date: 10/23/2024 EXAM: 1 VIEW(S) XRAY OF THE CHEST 10/23/2024 10:50:52 PM COMPARISON: None available. CLINICAL HISTORY: Encounter for central line placement [id number] FINDINGS: LINES, TUBES AND DEVICES: Left internal jugular central venous catheter in place with tip overlying the superior caval atrial junction. LUNGS AND PLEURA: Benign calcified granuloma in right base. No pleural effusion. No pneumothorax. HEART AND MEDIASTINUM: Calcified aorta. No acute abnormality of the cardiac silhouette. BONES AND SOFT TISSUES: No acute osseous abnormality. UPPER ABDOMEN:  Gaseous distention of stomach. IMPRESSION: 1. Left internal jugular central venous catheter in place with tip overlying the superior caval atrial junction. Electronically signed by: Norman Gatlin MD 10/23/2024 10:59 PM EST RP Workstation: [id number]    Assessment/PlanCynthia M [name] is an 68 y.o. 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   **AKI:  mild CKD at baseline now with AKI following shock secondary to ABLA and contrast exposure.  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  I suspect most her AKI is ATN and contrast mediated and would not recommend pursing the L RAS at this time.  No current indications for RRT but should they arise husband consents to proceed.  For now continue max supportive care maintaining euvolemia - ok to cont MIVF for now, avoid nephrotoxins, avoid extremes  of BP.  Will follow daily labs and UOP.     **Anemia: ABLA secondary post op hemorrahge.  Stabilized, Hb in 9-10 now.   **Hypokalemia: K 3.4 this AM, would just trend for now.    **R renal wedge shaped infarct:  suspect this is due to vascular dz + profound shock.  No other localizing sign of embolic issues at this time.    Will follow closely with you, please reach out with any concerns.   Erin [name] 10/25/2024, 12:50 PM

## 2024-10-25 NOTE — Progress Notes (Signed)
 NAME:  Erin [name], MRN:  [id number], DOB:  07/23/1956, LOS: 2 ADMISSION DATE:  10/23/2024, CONSULTATION DATE:  10/23/24 REFERRING MD:  surgery CHIEF COMPLAINT:  hypotension   History of Present Illness:  68 yo female presented for elective hernia repair. Post operatively pt remained hypotensive on pressor. Pt is laying in bed calling out in pain of legs and abdomen. Feels that she needs to keep her legs bent. Appears very pale in skin and mucosal membranes. Pt is oriented but has requested all questions are directed to her husband at this time. Pt does endorse dizziness, despite being supine and feeling weak and cold. Otherwise ROS negative or pt is not able to verbalize other symptoms.    Husband on the phone states that leg pain is chronic and typically has been 2/2 low mag and potassium for which she is reportedly on supplementation for. Husband also states that she recently (10/14) had mesenteric artery stenting and he is concerned that this is contributing to her presentation post operatively from the hernia repair. I have discussed with him plan for cvc, repeat labs and likely cta to eval for active bleed. He agrees with plan and consents to further blood product transfusion.    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  Significant Hospital Events: Including procedures, antibiotic start and stop dates in addition to other pertinent events   12/3 admitted to ICU postop 12/3-post hernia repair 12/4 taken back to the OR for hemoperitoneum-active bleed was not identified, abdomen packed with wound VAC in place 12/5 back to the OR for an open abdomen-no evidence of ongoing bleeding and abdomen was closed  Interim History / Subjective:  Back to the OR today Abdomen close Still on vent, weaning of  sedation  Objective    Blood pressure 123/71, pulse 79, temperature (!) 100.9 F (38.3 C), resp. rate 14, height 5' 3 (1.6 m), weight 44.4 kg, SpO2 92%.    Vent Mode: PRVC FiO2 (%):  [30 %-40 %] 30 % Set Rate:  [14 bmp] 14 bmp Vt Set:  [420 mL] 420 mL PEEP:  [5 cmH20] 5 cmH20 Plateau Pressure:  [12 cmH20-20 cmH20] 20 cmH20   Intake/Output Summary (Last 24 hours) at 10/25/2024 0916 Last data filed at 10/25/2024 9094 Gross per 24 hour  Intake 4148.4 ml  Output 2305 ml  Net 1843.4 ml   Filed Weights   10/23/24 0658 10/23/24 0709  Weight: 44.4 kg 44.4 kg    Fentanyl  push as needed Off pressors  Examination: General: Middle-age, does not appear to be in distress HENT: Moist oral mucosa, endotracheal tube in place Lungs: Fair air entry bilaterally Cardiovascular: S1-S2 appreciated, no murmur Abdomen: Postsurgical Extremities: Warm and dry Neuro: Unresponsive at present GU:   I reviewed last 24 h vitals and pain scores, last 48 h intake and output, last 24 h labs and trends, and last 24 h imaging results.  Received transfusion 10/24/2024 Cryoprecipitate-2 pools FFP-2 units Blood-3 units  Potassium 3.2, BUN 27, creatinine 2.75 AST of 326, ALT of 111  Fluid +10 L  Resolved problem list   Assessment and Plan   Hemorrhagic shock Received blood products Concern for DIC - Repeat DIC panel better  Post exploratory laparotomy Abdomen now closed - Mesh explanted  Has been able to wean off pressors  Metabolic acidosis -  Discontinue bicarb  Acute kidney injury - Maintain renal perfusion - Avoid nephrotoxic medication  Continue to closely follow labs  Wean off sedation Will start weaning ventilator  Transaminitis - Will continue to monitor  Labs   CBC: Recent Labs  Lab 10/23/24 1636 10/23/24 2233 10/23/24 2234 10/24/24 0610 10/24/24 1139 10/24/24 1500 10/24/24 1940 10/24/24 2151 10/24/24 2353 10/25/24 0350 10/25/24 0507  WBC 11.7* 13.4*  --   7.4  --  8.8  --   --   --   --  8.8  NEUTROABS 10.1*  --   --   --   --   --   --   --   --   --   --   HGB 8.1* 3.8*  --  11.4*   < > 11.6* 10.8*  --  10.9* 10.2* 10.4*  HCT 25.2* 11.8*  --  31.4*   < > 32.2* 29.8*  --  29.5* 28.2* 28.5*  MCV 89.0 90.1  --  84.4  --  84.1  --   --   --   --  84.8  PLT 117* 75* 75* 26*  26*  --  34*  36*  --  38*  --   --  42*  44*   < > = values in this interval not displayed.    Basic Metabolic Panel: Recent Labs  Lab 10/18/24 1205 10/23/24 1420 10/23/24 2233 10/24/24 0610 10/25/24 0507  NA 137 140 139 140 140  K 3.6 4.4 5.3* 3.8 3.2*  CL 103 107 115* 106 100  CO2 22  --  14* 22 31  GLUCOSE 90 186* 203* 130* 117*  BUN 25* 20 22 22  27*  CREATININE 1.47* 1.70* 1.81* 1.93* 2.75*  CALCIUM  9.0  --  5.7* 6.8* 6.8*  MG  --   --  1.6*  --   --    GFR: Estimated Creatinine Clearance: 13.7 mL/min (A) (by C-G formula based on SCr of 2.75 mg/dL (H)). Recent Labs  Lab 10/23/24 2233 10/24/24 0610 10/24/24 1500 10/25/24 0507  WBC 13.4* 7.4 8.8 8.8  LATICACIDVEN 4.7* 1.8  --   --     Liver Function Tests: Recent Labs  Lab 10/23/24 2233 10/24/24 0610 10/25/24 0507  AST 43* 446* 326*  ALT 26 269* 111*  ALKPHOS 32* 45 52  BILITOT <0.2 0.3 <0.2  PROT <3.0* 3.8* 3.8*  ALBUMIN  2.0* 2.6* 2.3*   No results for input(s): LIPASE, AMYLASE in the last 168 hours. No results for input(s): AMMONIA in the last 168 hours.  ABG    Component Value Date/Time   PHART 7.38 10/24/2024 2144   PCO2ART 46 10/24/2024 2144   PO2ART 76 (L) 10/24/2024 2144   HCO3 27.3 10/24/2024 2144   TCO2 20 (L) 10/23/2024 1420   ACIDBASEDEF 2.0 08/14/2017 2217   O2SAT 97.4 10/24/2024 2144     Coagulation Profile: Recent Labs  Lab 10/23/24 2234 10/24/24 0610 10/24/24 1500 10/24/24 2151 10/25/24 0507  INR 1.9* 1.4* 1.2 1.3* 1.3*    Cardiac Enzymes: No results for input(s): CKTOTAL, CKMB, CKMBINDEX, TROPONINI in the last 168 hours.  HbA1C: Hgb  A1c MFr Bld  Date/Time Value Ref Range Status  10/18/2024 12:05 PM 4.9 4.8 - 5.6 % Final    Comment:    (NOTE)         Prediabetes: 5.7 - 6.4         Diabetes: >6.4         Glycemic control for  adults with diabetes: <7.0   12/10/2021 03:32 PM 5.2 <5.7 % of total Hgb Final    Comment:    For the purpose of screening for the presence of diabetes: . <5.7%       Consistent with the absence of diabetes 5.7-6.4%    Consistent with increased risk for diabetes             (prediabetes) > or =6.5%  Consistent with diabetes . This assay result is consistent with a decreased risk of diabetes. . Currently, no consensus exists regarding use of hemoglobin A1c for diagnosis of diabetes in children. . According to American Diabetes Association (ADA) guidelines, hemoglobin A1c <7.0% represents optimal control in non-pregnant diabetic patients. Different metrics may apply to specific patient populations.  Standards of Medical Care in Diabetes(ADA). .     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), Anxiety, Aortoiliac occlusive disease (HCC) (08/14/2017), Arthritis, B12 deficiency anemia (09/14/2015), Blood transfusion without reported diagnosis, BPPV (benign paroxysmal positional vertigo) (07/08/2015), Cardiomyopathy (HCC) (11/03/2017), Chronic back pain, Chronic daily headache (03/29/2013), Closed right hip fracture (HCC) (09/10/2015), Coronary artery disease involving native coronary artery of native heart without angina pectoris (10/13/2017), Depression, Essential (hemorrhagic) thrombocythemia (HCC) (09/06/2018), Essential hypertension (09/25/2023), Family history of early CAD (04/08/2016), GERD (gastroesophageal reflux disease), History of hiatal  hernia, Hot flashes, Hyperlipidemia, Hyperlipidemia associated with type 2 diabetes mellitus (HCC) (05/11/2017), Hypothyroidism (09/10/2015), IBS (irritable bowel syndrome) (03/29/2013), Neck pain (07/08/2015), 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   Surgical History:   Past Surgical History:  Procedure Laterality Date   ABDOMINAL AORTOGRAM N/A 08/02/2017   Procedure: ABDOMINAL AORTOGRAM;  Surgeon: Darron Deatrice LABOR, MD;  Location: [facility];  Service: Cardiovascular;  Laterality: N/A;   ABDOMINAL AORTOGRAM W/LOWER EXTREMITY Bilateral 09/03/2024   Procedure: ABDOMINAL AORTOGRAM W/LOWER EXTREMITY;  Surgeon: Serene Gaile ORN, MD;  Location: [facility];  Service: Cardiovascular;  Laterality: Bilateral;   AORTA - BILATERAL FEMORAL ARTERY BYPASS GRAFT N/A 08/14/2017   Procedure: AORTA BIFEMORAL BYPASS GRAFT;  Surgeon: Oris Krystal FALCON, MD;  Location: [facility] OR;  Service: Vascular;  Laterality: N/A;   COLONOSCOPY     COLONOSCOPY N/A 08/29/2024   Procedure: COLONOSCOPY;  Surgeon: Wilhelmenia Aloha Raddle., MD;  Location: [facility];  Service: Gastroenterology;  Laterality: N/A;   CORONARY STENT INTERVENTION N/A 10/13/2017   Procedure: CORONARY STENT INTERVENTION;  Surgeon: Mady Bruckner, MD;  Location: [facility];  Service: Cardiovascular;  Laterality: N/A;   dental implant     ECTOPIC PREGNANCY SURGERY     EMBOLECTOMY N/A 08/14/2017   Procedure: EMBOLECTOMY FEMORAL;  Surgeon: Oris Krystal FALCON, MD;  Location: [facility] OR;  Service: Vascular;  Laterality: N/A;   FEMORAL-POPLITEAL BYPASS GRAFT Right 08/14/2017   Procedure: Right Femoral to Above Knee Popliteal Bypass Graft using Non-Reversed Greater Saphenous Vein Graft from Right Leg;  Surgeon: Oris Krystal FALCON, MD;  Location: [facility] OR;  Service:  Vascular;  Laterality: Right;   FLEXIBLE SIGMOIDOSCOPY N/A 08/14/2013   Procedure: FLEXIBLE SIGMOIDOSCOPY;  Surgeon: Gwendlyn ONEIDA Buddy, MD;  Location: [facility];  Service: Endoscopy;  Laterality: N/A;  LAPAROTOMY N/A 10/24/2024   Procedure: EXPLORATORY LAPAROTOMY, ABDOMINAL PACKING AND WOUND VAC APPLICATION;  Surgeon: Debby Hila, MD;  Location: [facility];  Service: General;  Laterality: N/A;  (8) EIGHT 18X18 LAP SPONGE PACKED   LEFT HEART CATH AND CORONARY ANGIOGRAPHY N/A 10/13/2017   Procedure: LEFT HEART CATH AND CORONARY ANGIOGRAPHY;  Surgeon: Mady Bruckner, MD;  [facility];  Service: Cardiovascular;  Laterality: N/A;   LOWER EXTREMITY ANGIOGRAPHY Bilateral 08/02/2017   Procedure: Lower Extremity Angiography;  Surgeon: Darron Deatrice LABOR, MD;  Location[facility];  Service: Cardiovascular;  Laterality: Bilateral;   LOWER EXTREMITY INTERVENTION N/A 09/03/2024   Procedure: LOWER EXTREMITY INTERVENTION;  Surgeon: Serene Gaile ORN, MD;  [facility];  Service: Cardiovascular;  Laterality: N/A;   MULTIPLE TOOTH EXTRACTIONS     PERIPHERAL VASCULAR INTERVENTION  05/22/2018   Procedure: PERIPHERAL VASCULAR INTERVENTION;  Surgeon: Serene Gaile ORN, MD;  [facility];  Service: Cardiovascular;;  SMA and Celiac   PILONIDAL CYST EXCISION     POLYPECTOMY     THROMBECTOMY FEMORAL ARTERY Right 08/14/2017   Procedure: THROMBECTOMY FEMORAL ARTERY;  Surgeon: Oris Krystal FALCON, MD;  Location: [facility] OR;  Service: Vascular;  Laterality: Right;   TONSILLECTOMY     TOTAL HIP ARTHROPLASTY  09/11/2015   Procedure: TOTAL HIP ARTHROPLASTY;  Surgeon: Evalene JONETTA Chancy, MD;  Location: [facility] OR;  Service: Orthopedics;;   ULTRASOUND GUIDANCE FOR VASCULAR ACCESS  10/13/2017   Procedure: Ultrasound Guidance For Vascular Access;  Surgeon: Mady Bruckner, MD;  [facility];  Service: Cardiovascular;;   VENTRAL HERNIA REPAIR N/A 10/23/2024   Procedure: REPAIR,  HERNIA, VENTRAL, LAPAROSCOPIC;  Surgeon: Rubin Calamity, MD;  Location: [facility];  Service: General;  Laterality: N/A;   VISCERAL ANGIOGRAPHY N/A 03/03/2020   Procedure: MESTENRIC ANGIOGRAPHY;  Surgeon: Serene Gaile ORN, MD;  [facility];  Service: Cardiovascular;  Laterality: N/A;   VISCERAL ANGIOGRAPHY N/A 09/03/2024   Procedure: VISCERAL ANGIOGRAPHY;  Surgeon: Serene Gaile ORN, MD;  [facility];  Service: Cardiovascular;  Laterality: N/A;   VISCERAL ARTERY INTERVENTION N/A 09/03/2024   Procedure: VISCERAL ARTERY INTERVENTION;  Surgeon: Serene Gaile ORN, MD;  [facility];  Service: Cardiovascular;  Laterality: N/A;     Social History:   reports that she quit smoking about 10 years ago. Her smoking use included cigarettes. She has never used smokeless tobacco. She reports that she does not drink alcohol and does not use drugs.   Family History:  Her family history includes Arthritis in her mother; Dementia in her mother; Heart attack in her father, maternal grandfather, maternal uncle, and mother; Heart disease in her father and mother; Hyperlipidemia in her father and mother; Hypertension in her father and mother; Irritable bowel syndrome in her mother; Lung cancer (age of onset: 12) in her brother; Prostate cancer in her father; Stroke (age of onset: 86) in her mother; Stroke (age of onset: 21) in her father; Thyroid  disease in her father and mother. There is no history of Colon cancer, Rectal cancer, Stomach cancer, or Esophageal cancer.   Allergies Allergies  Allergen Reactions   Latex Itching    Must use paper tape   Amitiza  [Lubiprostone ] Diarrhea    Uncontrollable diarrhea   Nortriptyline  Other (See Comments)    Stomach distention   Sulfa Antibiotics Nausea And Vomiting and Other (See Comments)    GI distress/pain   Tramadol  Itching and  Rash   Atorvastatin  Nausea And Vomiting   Claritin [Loratadine] Other (See Comments)    Hot  flashes    The patient is critically ill with multiple organ systems failure and requires high complexity decision making for assessment and support, frequent evaluation and titration of therapies, application of advanced monitoring technologies and extensive interpretation of multiple databases. Critical Care Time devoted to patient care services described in this note independent of APP/resident time (if applicable)  is 40 minutes.   Jennet Epley MD [facility] Personal pager: See Amion If unanswered, please page CCM On-call: [phone]

## 2024-10-25 NOTE — Anesthesia Postprocedure Evaluation (Signed)
 Anesthesia Post Note  Patient: Erin [name]  Procedure(s) Performed: LAPAROTOMY, EXPLORATORY     Patient location during evaluation: SICU Anesthesia Type: General Level of consciousness: sedated Pain management: pain level controlled Vital Signs Assessment: post-procedure vital signs reviewed and stable Respiratory status: patient remains intubated per anesthesia plan Cardiovascular status: stable Postop Assessment: no apparent nausea or vomiting Anesthetic complications: no   No notable events documented.  Last Vitals:  Vitals:   10/25/24 0630 10/25/24 0700  BP:    Pulse: 80 79  Resp: 14 14  Temp: (!) 38.4 C (!) 38.3 C  SpO2: 92% 92%    Last Pain:  Vitals:   10/25/24 0000  TempSrc: Rectal  PainSc:                  Erin [name],W. EDMOND

## 2024-10-25 NOTE — TOC Initial Note (Signed)
 Transition of Care [facility]) - Initial/Assessment Note    Patient Details  Name: Erin [name] MRN: [id number] Date of Birth: 1956/05/01  Transition of [facility]) CM/SW Contact:    Jon ONEIDA Anon, RN Phone Number: 10/25/2024, 3:43 PM  Clinical Narrative:                 Pt is from home. Pt is currently sedated on mechanical ventilation. Pt needing continued medical workup, not medically stable to discharge at this time. IP CM continuing to follow for any DC planning needs.      Expected Discharge Plan:  (TBD as pt is sedated and intubated in the ICU) Barriers to Discharge: Continued Medical Work up   Patient Goals and CMS Choice Patient states their goals for this hospitalization and ongoing recovery are:: UTA due to pt on the ventilator          Expected Discharge Plan and Services   Discharge Planning Services: CM Consult   Living arrangements for the past 2 months: Single Family Home Expected Discharge Date: 10/23/24               DME Arranged: N/A DME Agency: NA       HH Arranged: NA HH Agency: NA        Prior Living Arrangements/Services Living arrangements for the past 2 months: Single Family Home Lives with:: Spouse Patient language and need for interpreter reviewed:: Yes              Criminal Activity/Legal Involvement Pertinent to Current Situation/Hospitalization: No - Comment as needed  Activities of Daily Living   ADL Screening (condition at time of admission) Independently performs ADLs?: Yes (appropriate for developmental age) Is the patient deaf or have difficulty hearing?: No Does the patient have difficulty seeing, even when wearing glasses/contacts?: No Does the patient have difficulty concentrating, remembering, or making decisions?: No  Permission Sought/Granted Permission sought to share information with : Family Supports    Share Information with NAME: Maiyah, Goyne (Spouse)  [phone]           Emotional  Assessment Appearance:: Other (Comment Required (UTA) Attitude/Demeanor/Rapport: Unable to Assess Affect (typically observed): Unable to Assess Orientation: :  (Pt sedated on mechanical ventilation at this time) Alcohol / Substance Use: Not Applicable Psych Involvement: No (comment)  Admission diagnosis:  Incisional hernia, without obstruction or gangrene [K43.2] S/P hernia repair [S01.109, Z87.19] Shock (HCC) [R57.9] Patient Active Problem List   Diagnosis Date Noted   Malnutrition of moderate degree 10/25/2024   S/P hernia repair 10/23/2024   Shock (HCC) 10/23/2024   History of colonic polyps 08/29/2024   Renal artery stenosis 07/02/2024   AKI (acute kidney injury) 01/14/2024   Essential hypertension 09/25/2023   Restless leg syndrome 04/20/2023   Preoperative cardiovascular examination 05/10/2022   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/03/2020   Hyperlipidemia 02/12/2020   Cardiomyopathy (HCC) 11/03/2017   Peripheral vascular disease 11/03/2017   Coronary artery disease involving native coronary artery of native heart without angina pectoris 10/13/2017   Aortoiliac occlusive disease (HCC) 08/14/2017   Hypokalemia 06/06/2016   Family history of early CAD 04/08/2016   Elevated glucose 03/18/2016   B12 deficiency anemia 09/14/2015   Hypothyroidism 09/10/2015   GERD without esophagitis 09/10/2015   BPPV (benign paroxysmal positional vertigo) 07/08/2015   Neck pain 07/08/2015   Anal cancer (HCC) 08/19/2013   IBS (irritable bowel syndrome) 03/29/2013  Chronic daily headache 03/29/2013   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   PCP:  [name] HERO, MD Pharmacy:   [phone] - [place], [place] - [address] [place] [address] Phone: [phone] Fax: [phone]  [place] [facility]armacy [address], [address] [place] [address] Phone: [phone] Fax: [phone]     Social Drivers of Health (SDOH) Social History: SDOH Screenings   Food Insecurity: No Food Insecurity (10/24/2024)  Housing: Low Risk  (10/24/2024)  Transportation Needs: No Transportation Needs (10/24/2024)  Utilities: Not At Risk (10/24/2024)  Alcohol Screen: Low Risk  (02/26/2024)  Depression (PHQ2-9): Low Risk  (02/26/2024)  Financial Resource Strain: Low Risk  (02/26/2024)  Physical Activity: Inactive (02/26/2024)  Social Connections: Socially Integrated (10/24/2024)  Stress: No Stress Concern Present (02/26/2024)  Tobacco Use: Medium Risk (10/23/2024)  Health Literacy: Adequate Health Literacy (02/26/2024)   SDOH Interventions:     Readmission Risk Interventions     No data to display

## 2024-10-25 NOTE — Anesthesia Procedure Notes (Signed)
 Procedure Name: Intubation Date/Time: 10/25/2024 7:35 AM  Performed by: Obadiah Reyes BROCKS, CRNAPre-anesthesia Checklist: Patient identified, Emergency Drugs available, Suction available and Patient being monitored Patient Re-evaluated:Patient Re-evaluated prior to induction Oxygen Delivery Method: Circle System Utilized Preoxygenation: Pre-oxygenation with 100% oxygen Induction Type: IV induction Ventilation: Mask ventilation without difficulty Placement Confirmation: ETT inserted through vocal cords under direct vision, positive ETCO2 and breath sounds checked- equal and bilateral Tube secured with: Tape Dental Injury: Teeth and Oropharynx as per pre-operative assessment

## 2024-10-25 NOTE — Op Note (Signed)
 10/25/2024  8:46 AM  PATIENT:  Erin [name]  68 y.o. female  PRE-OPERATIVE DIAGNOSIS:  OPEN ABDOMEN  POST-OPERATIVE DIAGNOSIS:  OPEN ABDOMEN  PROCEDURE:  Procedure(s) with comments: LAPAROTOMY, EXPLORATORY (N/A) - REMOVAL OF PACKING Explantation of mesh Closure of abdomen  SURGEON:  Surgeons and Role:    DEWAINE Rubin Calamity, MD - Primary  ASSISTANTS: none   ANESTHESIA:   general  EBL:  15 mL   BLOOD ADMINISTERED:none  DRAINS: none   LOCAL MEDICATIONS USED:  NONE  SPECIMEN:  No Specimen  DISPOSITION OF SPECIMEN:  N/A  COUNTS:  YES, 8 laparotomy pads were removed from the previous operation that were intentionally left in place.  TOURNIQUET:  * No tourniquets in log *  DICTATION: .Dragon Dictation Indicated procedure: Patient is a 68 year old female previously who underwent a laparoscopic hernia pair with a subsequent hemorrhage intraperitoneally.  Patient was taken back emergently for an ex lap.  Patient was found to have no active bleeding.  She was packed in the ABThera VAC was placed.  Patient was taken back to the operating room for reexploration and removal of laparotomy pads and closure of abdomen.  Findings: A laparotomy pads were left in place intentionally in the subsequent operation.  These were removed in its entirety.  The previous mesh was explanted.  The abdominal wall and omentum was visualized for any active bleeding.  There was no active bleeding that could be seen.  At this time the fascia was reapproximated using interrupted Novofils as well as running 0 PDS sutures.  Details procedure: After the patient was consented patient was taken back to the OR placed in supine position with bilateral SCDs in place.  Patient underwent general anesthesia as she was intubated previously.  She is prepped and draped in standard fashion.  A timeout was called and all facts verified.  At this time the previous ABThera VAC was removed.  There was no active bleeding  that could be seen.  8 of the laparotomy pads were removed.  There is no further laparotomy pads found in all 4 quadrants after palpating.  At this time I visualized the abdominal wall on the right side.  There is no active bleeding.  At this time I decided to explant the previous mesh as it was incised from the previous laparotomy.  The entire mesh was removed.  Tacks were removed from the anterior abdominal wall.  At this time and revisualize the right side anterior abdominal wall.  There was no further bleeding that could be seen.  The omentum was visualized.  There is no active bleeding.  The abdomen was irrigated out with sterile saline.  At this time I decided to close the midline fascia using #1 PDS in a standard running fashion.  There was interrupted 0 Novofils are placed in a figure-of-eight fashion.  At this time the skin was reapproximated using skin staples.  Patient tolerated the procedure well was taken to the ICU in stable condition.   PLAN OF CARE: Admit to inpatient   PATIENT DISPOSITION:  ICU - intubated and hemodynamically stable.   Delay start of Pharmacological VTE agent (>24hrs) due to surgical blood loss or risk of bleeding: yes

## 2024-10-26 ENCOUNTER — Inpatient Hospital Stay (HOSPITAL_COMMUNITY)

## 2024-10-26 ENCOUNTER — Other Ambulatory Visit: Payer: Self-pay

## 2024-10-26 LAB — PHOSPHORUS
Phosphorus: 6 mg/dL — ABNORMAL HIGH (ref 2.5–4.6)
Phosphorus: 5.5 mg/dL — ABNORMAL HIGH (ref 2.5–4.6)

## 2024-10-26 LAB — COMPREHENSIVE METABOLIC PANEL WITH GFR
ALT: 31 U/L (ref 0–44)
AST: 163 U/L — ABNORMAL HIGH (ref 15–41)
Albumin: 2.2 g/dL — ABNORMAL LOW (ref 3.5–5.0)
Alkaline Phosphatase: 74 U/L (ref 38–126)
Anion gap: 11 (ref 5–15)
BUN: 30 mg/dL — ABNORMAL HIGH (ref 8–23)
CO2: 29 mmol/L (ref 22–32)
Calcium: 7.4 mg/dL — ABNORMAL LOW (ref 8.9–10.3)
Chloride: 101 mmol/L (ref 98–111)
Creatinine, Ser: 2.88 mg/dL — ABNORMAL HIGH (ref 0.44–1.00)
GFR, Estimated: 17 mL/min — ABNORMAL LOW (ref >60)
Glucose, Bld: 98 mg/dL (ref 70–99)
Potassium: 3.1 mmol/L — ABNORMAL LOW (ref 3.5–5.1)
Sodium: 141 mmol/L (ref 135–145)
Total Bilirubin: 0.2 mg/dL (ref 0.0–1.2)
Total Protein: 4 g/dL — ABNORMAL LOW (ref 6.5–8.1)

## 2024-10-26 LAB — GLUCOSE, CAPILLARY
Glucose-Capillary: 98 mg/dL (ref 70–99)
Glucose-Capillary: 95 mg/dL (ref 70–99)
Glucose-Capillary: 99 mg/dL (ref 70–99)
Glucose-Capillary: 96 mg/dL (ref 70–99)

## 2024-10-26 LAB — CBC WITH DIFFERENTIAL/PLATELET
Abs Immature Granulocytes: 0.05 K/uL (ref 0.00–0.07)
Basophils Absolute: 0 K/uL (ref 0.0–0.1)
Basophils Relative: 0 %
Eosinophils Absolute: 0 K/uL (ref 0.0–0.5)
Eosinophils Relative: 1 %
HCT: 26.5 % — ABNORMAL LOW (ref 36.0–46.0)
Hemoglobin: 9.1 g/dL — ABNORMAL LOW (ref 12.0–15.0)
Immature Granulocytes: 1 %
Lymphocytes Relative: 7 %
Lymphs Abs: 0.4 K/uL — ABNORMAL LOW (ref 0.7–4.0)
MCH: 31 pg (ref 26.0–34.0)
MCHC: 34.3 g/dL (ref 30.0–36.0)
MCV: 90.1 fL (ref 80.0–100.0)
Monocytes Absolute: 0.4 K/uL (ref 0.1–1.0)
Monocytes Relative: 7 %
Neutro Abs: 5.4 K/uL (ref 1.7–7.7)
Neutrophils Relative %: 84 %
Platelets: 52 K/uL — ABNORMAL LOW (ref 150–400)
RBC: 2.94 MIL/uL — ABNORMAL LOW (ref 3.87–5.11)
RDW: 15.3 % (ref 11.5–15.5)
WBC: 6.4 K/uL (ref 4.0–10.5)
nRBC: 0 % (ref 0.0–0.2)

## 2024-10-26 LAB — MAGNESIUM
Magnesium: 2.8 mg/dL — ABNORMAL HIGH (ref 1.7–2.4)
Magnesium: 2.6 mg/dL — ABNORMAL HIGH (ref 1.7–2.4)

## 2024-10-26 LAB — HEMOGLOBIN AND HEMATOCRIT, BLOOD
HCT: 26.8 % — ABNORMAL LOW (ref 36.0–46.0)
HCT: 27.6 % — ABNORMAL LOW (ref 36.0–46.0)
HCT: 26.8 % — ABNORMAL LOW (ref 36.0–46.0)
Hemoglobin: 9.2 g/dL — ABNORMAL LOW (ref 12.0–15.0)
Hemoglobin: 9.4 g/dL — ABNORMAL LOW (ref 12.0–15.0)
Hemoglobin: 9 g/dL — ABNORMAL LOW (ref 12.0–15.0)

## 2024-10-26 MED ORDER — OXYCODONE HCL 5 MG PO TABS
5.0000 mg | ORAL_TABLET | ORAL | Status: DC | PRN
  Administered 2024-10-26 – 2024-10-28 (×4): 10 mg
  Administered 2024-10-29: 5 mg
  Administered 2024-10-30: 10 mg
  Filled 2024-10-26 (×6): qty 2
  Filled 2024-10-26: qty 1

## 2024-10-26 MED ORDER — FENTANYL CITRATE (PF) 50 MCG/ML IJ SOSY
25.0000 ug | PREFILLED_SYRINGE | INTRAMUSCULAR | Status: DC | PRN
  Administered 2024-10-26 – 2024-10-29 (×5): 25 ug via INTRAVENOUS
  Filled 2024-10-26 (×5): qty 1

## 2024-10-26 MED ORDER — LABETALOL HCL 5 MG/ML IV SOLN
10.0000 mg | INTRAVENOUS | Status: DC | PRN
  Administered 2024-10-26 (×2): 10 mg via INTRAVENOUS
  Filled 2024-10-26 (×4): qty 4

## 2024-10-26 MED ORDER — METHOCARBAMOL 500 MG PO TABS: 500.0000 mg | ORAL_TABLET | Freq: Four times a day (QID) | ORAL | Status: DC | PRN

## 2024-10-26 MED ORDER — PROSOURCE TF20 ENFIT COMPATIBL EN LIQD
60.0000 mL | Freq: Every day | ENTERAL | Status: DC
  Administered 2024-10-26: 60 mL
  Filled 2024-10-26: qty 60

## 2024-10-26 MED ORDER — VITAL HP 1.0 CAL PO LIQD: 1000.0000 mL | ORAL | Status: DC

## 2024-10-26 MED ORDER — HYDRALAZINE HCL 20 MG/ML IJ SOLN
10.0000 mg | INTRAMUSCULAR | Status: DC | PRN
  Administered 2024-10-30 – 2024-11-05 (×5): 10 mg via INTRAVENOUS
  Filled 2024-10-26 (×5): qty 1

## 2024-10-26 MED ORDER — POTASSIUM CHLORIDE 20 MEQ PO PACK
40.0000 meq | PACK | Freq: Two times a day (BID) | ORAL | Status: AC
  Administered 2024-10-26 (×2): 40 meq
  Filled 2024-10-26 (×2): qty 2

## 2024-10-26 MED ORDER — LABETALOL HCL 5 MG/ML IV SOLN
20.0000 mg | INTRAVENOUS | Status: DC | PRN
  Administered 2024-10-29 – 2024-11-06 (×16): 20 mg via INTRAVENOUS
  Filled 2024-10-26 (×16): qty 4

## 2024-10-26 MED ORDER — SODIUM CHLORIDE 0.9 % IV SOLN
2.0000 g | Freq: Every day | INTRAVENOUS | Status: DC
  Administered 2024-10-26 – 2024-10-27 (×2): 2 g via INTRAVENOUS
  Filled 2024-10-26 (×2): qty 20

## 2024-10-26 MED ORDER — CARVEDILOL 12.5 MG PO TABS
12.5000 mg | ORAL_TABLET | Freq: Two times a day (BID) | ORAL | Status: DC
  Administered 2024-10-26 – 2024-11-24 (×57): 12.5 mg
  Filled 2024-10-26 (×46): qty 1

## 2024-10-26 NOTE — Progress Notes (Signed)
 [name] Progress Note   Subjective:   On SBT this AM.  No pressors. Tm 101.3, WBC 6.4. .   I/Os yesterday 1.8/ 1.9 (UOP 1.2L, 0.7L NG outpt).  Husband bedside  Objective Vitals:   10/26/24 0800 10/26/24 0817 10/26/24 0900 10/26/24 1057  BP:      Pulse: 79  84   Resp: 14  14   Temp: (!) 100.8 F (38.2 C)  (!) 100.6 F (38.1 C)   TempSrc: Rectal     SpO2: 94% 94% 94% 93%  Weight:      Height:       Physical Exam Gen: thin woman intubated on vent  ENT:ETT in place Neck: L internal jugular triple lumen central line CV: RRR Abd:  abd binder in place Lungs: coarse ant on vent GU: foley draining normal yellow urine Extr: no edema Neuro:sedated  Additional Objective Labs: Basic Metabolic Panel: Recent Labs  Lab 10/25/24 0507 10/25/24 1524 10/26/24 0414 10/26/24 0815  NA 140 140 141  --   K 3.2* 2.8* 3.1*  --   CL 100 102 101  --   CO2 31 28 29   --   GLUCOSE 117* 102* 98  --   BUN 27* 27* 30*  --   CREATININE 2.75* 2.65* 2.88*  --   CALCIUM  6.8* 6.4* 7.4*  --   PHOS  --  5.2* 6.0* 5.5*   Liver Function Tests: Recent Labs  Lab 10/24/24 0610 10/25/24 0507 10/26/24 0414  AST 446* 326* 163*  ALT 269* 111* 31  ALKPHOS 45 52 74  BILITOT 0.3 <0.2 0.2  PROT 3.8* 3.8* 4.0*  ALBUMIN  2.6* 2.3* 2.2*   No results for input(s): LIPASE, AMYLASE in the last 168 hours. CBC: Recent Labs  Lab 10/23/24 1636 10/23/24 2233 10/23/24 2234 10/24/24 0610 10/24/24 1139 10/24/24 1500 10/24/24 1940 10/24/24 2151 10/24/24 2353 10/25/24 0507 10/25/24 1054 10/25/24 2034 10/25/24 2043 10/26/24 0414  WBC 11.7* 13.4*  --  7.4  --  8.8  --   --   --  8.8  --   --   --  6.4  NEUTROABS 10.1*  --   --   --   --   --   --   --   --   --   --   --   --  5.4  HGB 8.1* 3.8*  --  11.4*   < > 11.6*   < >  --    < > 10.4*   < > 9.1* 9.2* 9.1*  HCT 25.2* 11.8*  --  31.4*   < > 32.2*   < >  --    < > 28.5*   < > 26.4* 26.8* 26.5*  MCV 89.0 90.1  --  84.4  --  84.1   --   --   --  84.8  --   --   --  90.1  PLT 117* 75*   < > 26*  26*  --  34*  36*  --  38*  --  42*  44*  --   --   --  52*   < > = values in this interval not displayed.   Blood Culture    Component Value Date/Time   SDES URINE, CLEAN CATCH 09/12/2015 1225   SPECREQUEST NONE 09/12/2015 1225   CULT NO GROWTH 1 DAY 09/12/2015 1225   REPTSTATUS 09/13/2015 FINAL 09/12/2015 1225    Cardiac Enzymes: No results for input(s): CKTOTAL, CKMB,  CKMBINDEX, TROPONINI in the last 168 hours. CBG: Recent Labs  Lab 10/25/24 1537 10/25/24 2019 10/25/24 2330 10/26/24 0425 10/26/24 0816  GLUCAP 98 105* 94 98 95   Iron Studies: No results for input(s): IRON, TIBC, TRANSFERRIN, FERRITIN in the last 72 hours. @lablastinr3 @ Studies/Results: DG CHEST PORT 1 VIEW Result Date: 10/25/2024 EXAM: 1 VIEW(S) XRAY OF THE CHEST 10/25/2024 10:46:00 AM COMPARISON: Yesterday. CLINICAL HISTORY: 8198620 History of endotracheal intubation [id number] History of endotracheal intubation FINDINGS: LINES, TUBES AND DEVICES: Endotracheal and nasogastric tubes are appropriately in good position. Left internal jugular catheter is unchanged. LUNGS AND PLEURA: Mild right basilar atelectasis is noted. Probable small pleural effusion. No pneumothorax. HEART AND MEDIASTINUM: No acute abnormality of the cardiac and mediastinal silhouettes. BONES AND SOFT TISSUES: No acute osseous abnormality. IMPRESSION: 1. Mild right basilar atelectasis with probable small pleural effusion. Electronically signed by: Lynwood Seip MD 10/25/2024 11:21 AM EST RP Workstation: [id number]   Medications:  propofol  (DIPRIVAN ) infusion Stopped (10/26/24 0612)    carvedilol   12.5 mg Per Tube BID WC   Chlorhexidine  Gluconate Cloth  6 each Topical Daily   famotidine   20 mg Per Tube Daily   fentaNYL  (SUBLIMAZE ) injection  25-50 mcg Intravenous Once   gabapentin   100 mg Per Tube TID   levothyroxine   75 mcg Per Tube Q0600   mouth rinse  15 mL  Mouth Rinse Q2H   potassium chloride   40 mEq Per Tube BID    Assessment/Plan: Erin [name] is an 68 y.o. 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    **AKI:  mild CKD (Cr 1.1-1.3) at baseline now with AKI following shock secondary to ABLA and contrast exposure.  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  I suspect main etiology of AKI is ATN and contrast mediated and would not recommend pursing the L RAS at this time.  Cr from 1.5 admission to 2.9 today. Volume and lytes are ok, nonoliguric.  No current indications for RRT but should they arise husband consents to proceed.  For now continue max supportive care maintaining euvolemia - ok to cont MIVF for now, avoid nephrotoxins, avoid extremes of BP.  Will follow daily labs and UOP.      **Anemia: ABLA secondary post op hemorrahge.  Stabilized, Hb in 9-10 now.    **Hypokalemia: K 3.1 this AM, being repleted.   **R renal wedge shaped infarct:  suspect this is due to vascular dz + profound shock.  No other localizing sign of embolic issues at this time.     Will follow closely with you, please reach out with any concerns.   Manuelita Barters MD 10/26/2024, 11:16 AM  [facility] Pager: [phone]

## 2024-10-26 NOTE — Progress Notes (Signed)
 Nutrition Consult   S/P ex lap and closure of abdomen 12/5.  Patient remains intubated at this time.  NG tube in place with 700 ml output x 24 hours. Received MD Consult for TF initiation and management. Per Surgeon's note, plans to continue gastric tube for decompression at this time.  NUTRITION DIAGNOSIS:  Moderate Malnutrition related to chronic illness as evidenced by moderate muscle depletion, percent weight loss (15% in 6 months).  GOAL: Patient will meet greater than or equal to 90% of their need  MONITOR: Vent status, Weight trends, ability to begin TF  INTERVENTION:  When clinical status allows use of GI tract, recommend initiate TF via NG tube: Vital AF 1.2 at 20 ml/h, increase by 10 ml every 12 hours to goal rate of 50 ml/h (1200 ml per day) Provides 1440 kcal, 90 gm protein, 973 ml free water  daily.  Suzen HUNT RD, LDN, CNSC Contact via secure chat. If unavailable, use group chat RD Inpatient.

## 2024-10-26 NOTE — Progress Notes (Signed)
 eLink Physician-Brief Progress Note Patient Name: Erin [name] DOB: 1956/11/18 MRN: [id number]   Date of Service  10/26/2024  HPI/Events of Note  Refractory hypertension despite labetalol  as needed  eICU Interventions  Increased dose of labetalol , second line hydralazine      Intervention Category Intermediate Interventions: Hypertension - evaluation and management  Ilya Neely 10/26/2024, 10:14 PM

## 2024-10-26 NOTE — Progress Notes (Signed)
 NAME:  Erin [name], MRN:  [id number], DOB:  May 03, 1956, LOS: 3 ADMISSION DATE:  10/23/2024, CONSULTATION DATE:  10/23/24 REFERRING MD:  surgery CHIEF COMPLAINT:  hypotension   History of Present Illness:  68 yo female presented for elective hernia repair. Post operatively pt remained hypotensive on pressor. Pt is laying in bed calling out in pain of legs and abdomen. Feels that she needs to keep her legs bent. Appears very pale in skin and mucosal membranes. Pt is oriented but has requested all questions are directed to her husband at this time. Pt does endorse dizziness, despite being supine and feeling weak and cold. Otherwise ROS negative or pt is not able to verbalize other symptoms.    Husband on the phone states that leg pain is chronic and typically has been 2/2 low mag and potassium for which she is reportedly on supplementation for. Husband also states that she recently (10/14) had mesenteric artery stenting and he is concerned that this is contributing to her presentation post operatively from the hernia repair. I have discussed with him plan for cvc, repeat labs and likely cta to eval for active bleed. He agrees with plan and consents to further blood product transfusion.    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  Significant Hospital Events: Including procedures, antibiotic start and stop dates in addition to other pertinent events   12/3 admitted to ICU postop 12/3-post hernia repair 12/4 taken back to the OR for hemoperitoneum-active bleed was not identified, abdomen packed with wound VAC in place 12/5 back to the OR for an open abdomen-no evidence of ongoing bleeding and abdomen was closed  Interim History / Subjective:  No overnight events Still slow to wake up Tmax 101.3  Objective     Blood pressure 123/71, pulse 73, temperature (!) 100.6 F (38.1 C), resp. rate 13, height 5' 3 (1.6 m), weight 44.4 kg, SpO2 93%.    Vent Mode: PRVC FiO2 (%):  [30 %-35 %] 30 % Set Rate:  [14 bmp] 14 bmp Vt Set:  [420 mL] 420 mL PEEP:  [5 cmH20] 5 cmH20 Plateau Pressure:  [14 cmH20-20 cmH20] 16 cmH20   Intake/Output Summary (Last 24 hours) at 10/26/2024 0735 Last data filed at 10/26/2024 9372 Gross per 24 hour  Intake 1332.74 ml  Output 1940 ml  Net -607.26 ml   Filed Weights   10/23/24 0658 10/23/24 0709  Weight: 44.4 kg 44.4 kg   Was on propofol , has been stopped this morning Fentanyl  pushes as needed  Examination: General: Middle-aged, does not appear to be in distress HENT: Moist oral mucosa, endotracheal tube in place Lungs: Fair air entry bilaterally Cardiovascular: S1-S2 appreciated, no murmur Abdomen: Postsurgical abdomen Extremities: Warm and dry Neuro: Unresponsive GU:   I reviewed last 24 h vitals and pain scores, last 48 h intake and output, last 24 h labs and trends, and last 24 h imaging results.  Received transfusion 10/24/2024 Cryoprecipitate-2 pools FFP-2 units Blood-3 units  Potassium 3.1 BUN of 30, creatinine of 2.88  phosphorus of 6.0 Magnesium  of 2.8   Resolved problem list   Assessment and Plan   Hemorrhagic shock Received blood products Concern for DIC - Repeat DIC panel did improve - No active bleeding - H&H stable  Post exploratory laparotomy Abdominal close - Mesh was explanted  Off  pressors  Acute kidney injury - Maintain renal perfusion - Avoid nephrotoxic medications  Continue to closely follow labs  Wean off sedation  Will start weaning ventilator  Fever - Has no specific source of infection - No significant leukocytosis - Will monitor  Labs   CBC: Recent Labs  Lab 10/23/24 1636 10/23/24 2233 10/23/24 2234 10/24/24 0610 10/24/24 1139 10/24/24 1500 10/24/24 1940 10/24/24 2151 10/24/24 2353  10/25/24 0507 10/25/24 1054 10/25/24 1122 10/25/24 2034 10/25/24 2043 10/26/24 0414  WBC 11.7* 13.4*  --  7.4  --  8.8  --   --   --  8.8  --   --   --   --  6.4  NEUTROABS 10.1*  --   --   --   --   --   --   --   --   --   --   --   --   --  5.4  HGB 8.1* 3.8*  --  11.4*   < > 11.6*   < >  --    < > 10.4* 9.6* 9.5* 9.1* 9.2* 9.1*  HCT 25.2* 11.8*  --  31.4*   < > 32.2*   < >  --    < > 28.5* 27.2* 27.2* 26.4* 26.8* 26.5*  MCV 89.0 90.1  --  84.4  --  84.1  --   --   --  84.8  --   --   --   --  90.1  PLT 117* 75*   < > 26*  26*  --  34*  36*  --  38*  --  42*  44*  --   --   --   --  52*   < > = values in this interval not displayed.    Basic Metabolic Panel: Recent Labs  Lab 10/23/24 2233 10/24/24 0610 10/25/24 0507 10/25/24 1524 10/26/24 0414  NA 139 140 140 140 141  K 5.3* 3.8 3.2* 2.8* 3.1*  CL 115* 106 100 102 101  CO2 14* 22 31 28 29   GLUCOSE 203* 130* 117* 102* 98  BUN 22 22 27* 27* 30*  CREATININE 1.81* 1.93* 2.75* 2.65* 2.88*  CALCIUM  5.7* 6.8* 6.8* 6.4* 7.4*  MG 1.6*  --   --   --  2.8*  PHOS  --   --   --  5.2* 6.0*   GFR: Estimated Creatinine Clearance: 13.1 mL/min (A) (by C-G formula based on SCr of 2.88 mg/dL (H)). Recent Labs  Lab 10/23/24 2233 10/24/24 0610 10/24/24 1500 10/25/24 0507 10/26/24 0414  WBC 13.4* 7.4 8.8 8.8 6.4  LATICACIDVEN 4.7* 1.8  --   --   --     Liver Function Tests: Recent Labs  Lab 10/23/24 2233 10/24/24 0610 10/25/24 0507 10/26/24 0414  AST 43* 446* 326* 163*  ALT 26 269* 111* 31  ALKPHOS 32* 45 52 74  BILITOT <0.2 0.3 <0.2 0.2  PROT <3.0* 3.8* 3.8* 4.0*  ALBUMIN  2.0* 2.6* 2.3* 2.2*   No results for input(s): LIPASE, AMYLASE in the last 168 hours. No results for input(s): AMMONIA in the last 168 hours.  ABG    Component Value Date/Time   PHART 7.38 10/24/2024 2144   PCO2ART 46 10/24/2024 2144   PO2ART 76 (L) 10/24/2024 2144   HCO3 31.5 (H) 10/25/2024 1100   TCO2 20 (L) 10/23/2024 1420    ACIDBASEDEF 2.0 08/14/2017 2217   O2SAT 76 10/25/2024 1100     Coagulation Profile:  Recent Labs  Lab 10/23/24 2234 10/24/24 0610 10/24/24 1500 10/24/24 2151 10/25/24 0507  INR 1.9* 1.4* 1.2 1.3* 1.3*    Cardiac Enzymes: No results for input(s): CKTOTAL, CKMB, CKMBINDEX, TROPONINI in the last 168 hours.  HbA1C: Hgb A1c MFr Bld  Date/Time Value Ref Range Status  10/18/2024 12:05 PM 4.9 4.8 - 5.6 % Final    Comment:    (NOTE)         Prediabetes: 5.7 - 6.4         Diabetes: >6.4         Glycemic control for adults with diabetes: <7.0   12/10/2021 03:32 PM 5.2 <5.7 % of total Hgb Final    Comment:    For the purpose of screening for the presence of diabetes: . <5.7%       Consistent with the absence of diabetes 5.7-6.4%    Consistent with increased risk for diabetes             (prediabetes) > or =6.5%  Consistent with diabetes . This assay result is consistent with a decreased risk of diabetes. . Currently, no consensus exists regarding use of hemoglobin A1c for diagnosis of diabetes in children. . According to American Diabetes Association (ADA) guidelines, hemoglobin A1c <7.0% represents optimal control in non-pregnant diabetic patients. Different metrics may apply to specific patient populations.  Standards of Medical Care in Diabetes(ADA). .     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), Anxiety, Aortoiliac occlusive disease (HCC) (08/14/2017), Arthritis, B12 deficiency anemia (09/14/2015), Blood transfusion without reported diagnosis, BPPV (benign paroxysmal positional vertigo) (07/08/2015), Cardiomyopathy (HCC) (11/03/2017), Chronic back pain, Chronic daily headache (03/29/2013), Closed right hip fracture (HCC)  (09/10/2015), Coronary artery disease involving native coronary artery of native heart without angina pectoris (10/13/2017), Depression, Essential (hemorrhagic) thrombocythemia (HCC) (09/06/2018), Essential hypertension (09/25/2023), Family history of early CAD (04/08/2016), GERD (gastroesophageal reflux disease), History of hiatal hernia, Hot flashes, Hyperlipidemia, Hyperlipidemia associated with type 2 diabetes mellitus (HCC) (05/11/2017), Hypothyroidism (09/10/2015), IBS (irritable bowel syndrome) (03/29/2013), Neck pain (07/08/2015), 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   Surgical History:   Past Surgical History:  Procedure Laterality Date   ABDOMINAL AORTOGRAM N/A 08/02/2017   Procedure: ABDOMINAL AORTOGRAM;  Surgeon: Darron Deatrice LABOR, MD;  Location: [facility];  Service: Cardiovascular;  Laterality: N/A;   ABDOMINAL AORTOGRAM W/LOWER EXTREMITY Bilateral 09/03/2024   Procedure: ABDOMINAL AORTOGRAM W/LOWER EXTREMITY;  Surgeon: Serene Gaile ORN, MD;  Location: [facility];  Service: Cardiovascular;  Laterality: Bilateral;   AORTA - BILATERAL FEMORAL ARTERY BYPASS GRAFT N/A 08/14/2017   Procedure: AORTA BIFEMORAL BYPASS GRAFT;  Surgeon: Oris Krystal FALCON, MD;  Location[facility] OR;  Service: Vascular;  Laterality: N/A;   COLONOSCOPY     COLONOSCOPY N/A 08/29/2024   Procedure: COLONOSCOPY;  Surgeon: Wilhelmenia Aloha Raddle., MD;  Location: [facility];  Service: Gastroenterology;  Laterality: N/A;   CORONARY STENT INTERVENTION N/A 10/13/2017   Procedure: CORONARY STENT INTERVENTION;  Surgeon: Mady Bruckner, MD;  Location: [facility];  Service: Cardiovascular;  Laterality: N/A;   dental implant  ECTOPIC PREGNANCY SURGERY     EMBOLECTOMY N/A 08/14/2017   Procedure:  EMBOLECTOMY FEMORAL;  Surgeon: Oris Krystal FALCON, MD;  Location: [facility] OR;  Service: Vascular;  Laterality: N/A;   FEMORAL-POPLITEAL BYPASS GRAFT Right 08/14/2017   Procedure: Right Femoral to Above Knee Popliteal Bypass Graft using Non-Reversed Greater Saphenous Vein Graft from Right Leg;  Surgeon: Oris Krystal FALCON, MD;  Location: [facility] OR;  Service: Vascular;  Laterality: Right;   FLEXIBLE SIGMOIDOSCOPY N/A 08/14/2013   Procedure: FLEXIBLE SIGMOIDOSCOPY;  Surgeon: Gwendlyn ONEIDA Buddy, MD;  Location: [facility];  Service: Endoscopy;  Laterality: N/A;   LAPAROTOMY N/A 10/24/2024   Procedure: EXPLORATORY LAPAROTOMY, ABDOMINAL PACKING AND WOUND VAC APPLICATION;  Surgeon: Debby Hila, MD;  Location: [facility];  Service: General;  Laterality: N/A;  (8) EIGHT 18X18 LAP SPONGE PACKED   LEFT HEART CATH AND CORONARY ANGIOGRAPHY N/A 10/13/2017   Procedure: LEFT HEART CATH AND CORONARY ANGIOGRAPHY;  Surgeon: Mady Bruckner, MD;  Location: [facility];  Service: Cardiovascular;  Laterality: N/A;   LOWER EXTREMITY ANGIOGRAPHY Bilateral 08/02/2017   Procedure: Lower Extremity Angiography;  Surgeon: Darron Deatrice LABOR, MD;  [facility] INV[facility] CV LAB;  Service: Cardiovascular;  Laterality: Bilateral;   LOWER EXTREMITY INTERVENTION N/A 09/03/2024   Procedure: LOWER EXTREMITY INTERVENTION;  Surgeon: Serene Gaile ORN, MD;  Location: [facility];  Service: Cardiovascular;  Laterality: N/A;   MULTIPLE TOOTH EXTRACTIONS     PERIPHERAL VASCULAR INTERVENTION  05/22/2018   Procedure: PERIPHERAL VASCULAR INTERVENTION;  Surgeon: Serene Gaile ORN, MD;  Location: [facility];  Service: Cardiovascular;;  SMA and Celiac   PILONIDAL CYST EXCISION     POLYPECTOMY     THROMBECTOMY FEMORAL ARTERY Right 08/14/2017   Procedure: THROMBECTOMY FEMORAL ARTERY;  Surgeon: Oris Krystal FALCON, MD;  Location: [facility] OR;  Service: Vascular;  Laterality: Right;   TONSILLECTOMY     TOTAL HIP ARTHROPLASTY  09/11/2015   Procedure: TOTAL HIP  ARTHROPLASTY;  Surgeon: Evalene JONETTA Chancy, MD;  Location: [facility] OR;  Service: Orthopedics;;   ULTRASOUND GUIDANCE FOR VASCULAR ACCESS  10/13/2017   Procedure: Ultrasound Guidance For Vascular Access;  Surgeon: Mady Bruckner, MD;  Location: [facility];  Service: Cardiovascular;;   VENTRAL HERNIA REPAIR N/A 10/23/2024   Procedure: REPAIR, HERNIA, VENTRAL, LAPAROSCOPIC;  Surgeon: Rubin Calamity, MD;  Location: [facility];  Service: General;  Laterality: N/A;   VISCERAL ANGIOGRAPHY N/A 03/03/2020   Procedure: MESTENRIC ANGIOGRAPHY;  Surgeon: Serene Gaile ORN, MD;  Location: [facility];  Service: Cardiovascular;  Laterality: N/A;   VISCERAL ANGIOGRAPHY N/A 09/03/2024   Procedure: VISCERAL ANGIOGRAPHY;  Surgeon: Serene Gaile ORN, MD;  Location: [facility];  Service: Cardiovascular;  Laterality: N/A;   VISCERAL ARTERY INTERVENTION N/A 09/03/2024   Procedure: VISCERAL ARTERY INTERVENTION;  Surgeon: Serene Gaile ORN, MD;  Location: [facility];  Service: Cardiovascular;  Laterality: N/A;     Social History:   reports that she quit smoking about 10 years ago. Her smoking use included cigarettes. She has never used smokeless tobacco. She reports that she does not drink alcohol and does not use drugs.   Family History:  Her family history includes Arthritis in her mother; Dementia in her mother; Heart attack in her father, maternal grandfather, maternal uncle, and mother; Heart disease in her father and mother; Hyperlipidemia in her father and mother; Hypertension in her father and mother; Irritable bowel syndrome in her mother; Lung cancer (age of onset: 54) in  her brother; Prostate cancer in her father; Stroke (age of onset: 57) in her mother; Stroke (age of onset: 66) in her father; Thyroid  disease in her father and mother. There is no history of Colon cancer, Rectal cancer, Stomach cancer, or Esophageal cancer.   Allergies Allergies  Allergen Reactions   Latex Itching    Must  use paper tape   Amitiza  [Lubiprostone ] Diarrhea    Uncontrollable diarrhea   Nortriptyline  Other (See Comments)    Stomach distention   Sulfa Antibiotics Nausea And Vomiting and Other (See Comments)    GI distress/pain   Tramadol  Itching and Rash   Atorvastatin  Nausea And Vomiting   Claritin [Loratadine] Other (See Comments)    Hot flashes    The patient is critically ill with multiple organ systems failure and requires high complexity decision making for assessment and support, frequent evaluation and titration of therapies, application of advanced monitoring technologies and extensive interpretation of multiple databases. Critical Care Time devoted to patient care services described in this note independent of APP/resident time (if applicable)  is 33 minutes.   Jennet Epley MD [facility] Personal pager: See Amion If unanswered, please page CCM On-call: #[phone]

## 2024-10-26 NOTE — Progress Notes (Signed)
 Informed about increased tracheal secretions  Will send tracheal aspirate for cultures  Empirically start Rocephin 

## 2024-10-26 NOTE — Progress Notes (Signed)
 1 Day Post-Op   Subjective/Chief Complaint: Sedation stopped this AM ~0600. Still not awake or alert. Not following commands. Worsening AKI on AM labs--Cr 2.88 from 2.65, making adequate volume of urine. NGT 700cc overnight. Hb 9.1 from 9.2.   Objective: Vital signs in last 24 hours: Temp:  [97.9 F (36.6 C)-101.3 F (38.5 C)] 100.6 F (38.1 C) (12/06 0700) Pulse Rate:  [67-86] 73 (12/06 0700) Resp:  [10-17] 13 (12/06 0700) SpO2:  [89 %-100 %] 93 % (12/06 0700) Arterial Line BP: (109-194)/(46-60) 182/55 (12/06 0700) FiO2 (%):  [30 %-35 %] 30 % (12/06 0420) Last BM Date :  (pta)  Intake/Output from previous day: 12/05 0701 - 12/06 0700 In: 1875.2 [I.V.:1225.2; NG/GT:250; IV Piggyback:400] Out: 1940 [Urine:1190; Emesis/NG output:700; Blood:50] Intake/Output this shift: No intake/output data recorded.  Gen: Somnolent, not following commands or having any spontaneous movements HEENT: NGT with 700cc overnight, light bile tinged CArdio: RRR, BP hypertensive Abd: Honeycomb dressing in place, incision c/d/i  Lab Results:  Recent Labs    10/25/24 0507 10/25/24 1054 10/25/24 2043 10/26/24 0414  WBC 8.8  --   --  6.4  HGB 10.4*   < > 9.2* 9.1*  HCT 28.5*   < > 26.8* 26.5*  PLT 42*  44*  --   --  52*   < > = values in this interval not displayed.   BMET Recent Labs    10/25/24 1524 10/26/24 0414  NA 140 141  K 2.8* 3.1*  CL 102 101  CO2 28 29  GLUCOSE 102* 98  BUN 27* 30*  CREATININE 2.65* 2.88*  CALCIUM  6.4* 7.4*   PT/INR Recent Labs    10/24/24 2151 10/25/24 0507  LABPROT 16.5* 16.6*  INR 1.3* 1.3*   ABG Recent Labs    10/24/24 2144 10/25/24 1100  PHART 7.38  --   HCO3 27.3 31.5*    Studies/Results: DG CHEST PORT 1 VIEW Result Date: 10/25/2024 EXAM: 1 VIEW(S) XRAY OF THE CHEST 10/25/2024 10:46:00 AM COMPARISON: Yesterday. CLINICAL HISTORY: 8198620 History of endotracheal intubation 8198620 History of endotracheal intubation FINDINGS: LINES, TUBES  AND DEVICES: Endotracheal and nasogastric tubes are appropriately in good position. Left internal jugular catheter is unchanged. LUNGS AND PLEURA: Mild right basilar atelectasis is noted. Probable small pleural effusion. No pneumothorax. HEART AND MEDIASTINUM: No acute abnormality of the cardiac and mediastinal silhouettes. BONES AND SOFT TISSUES: No acute osseous abnormality. IMPRESSION: 1. Mild right basilar atelectasis with probable small pleural effusion. Electronically signed by: Lynwood Seip MD 10/25/2024 11:21 AM EST RP Workstation: [id number]    Anti-infectives: Anti-infectives (From admission, onward)    Start     Dose/Rate Route Frequency Ordered Stop   10/23/24 0700  ceFAZolin  (ANCEF ) IVPB 2g/100 mL premix        2 g 200 mL/hr over 30 Minutes Intravenous On call to O.R. 10/23/24 9351 10/23/24 0833       Assessment/Plan: s/p Procedure(s) with comments: EXPLORATORY LAPAROTOMY, ABDOMINAL PACKING AND WOUND VAC APPLICATION (N/A) - (8) EIGHT 18X18 LAP SPONGE PACKED   POD 3 from lap VHR and reexploration for bleeding, POD 1 from reexploration and abdominal closure - Nephrology following for AKI, appreciate recommendations  - Will defer decision to supplement hypokalemia to nephrology - CCM assisting with management of critically ill patient, appreciate assistance  - Vent settings weaned, off sedation but not awake or alert to consider extubation on my exam this AM. Defer to CCM for extubation if patient becomes awake/alert/follows commands later this AM -  Continue abdominal binder - Continue gastric tube for decompression at this time - Continue to trend H/H, can space out to q8h unless patient becomes hypotensive, then would revert back to q4h H/H     No family at bedside this AM.  LOS: 3 days    Erin [name] 10/26/2024

## 2024-10-27 ENCOUNTER — Encounter (HOSPITAL_COMMUNITY): Payer: Self-pay | Admitting: General Surgery

## 2024-10-27 LAB — TYPE AND SCREEN
ABO/RH(D): A POS
ABO/RH(D): A POS
Antibody Screen: NEGATIVE
Antibody Screen: NEGATIVE
Unit division: 0
Unit division: 0
Unit division: 0
Unit division: 0
Unit division: 0
Unit division: 0
Unit division: 0
Unit division: 0
Unit division: 0
Unit division: 0
Unit division: 0

## 2024-10-27 LAB — BPAM RBC
Blood Product Expiration Date: 202512272359
Blood Product Expiration Date: 202512272359
Blood Product Expiration Date: 202512272359
Blood Product Expiration Date: 202512272359
Blood Product Expiration Date: 202512272359
Blood Product Expiration Date: 202512272359
Blood Product Expiration Date: 202512272359
Blood Product Expiration Date: 202512272359
Blood Product Expiration Date: 202512272359
Blood Product Expiration Date: 202512272359
Blood Product Expiration Date: 202512272359
ISSUE DATE / TIME: 202512040016
ISSUE DATE / TIME: 202512040144
ISSUE DATE / TIME: 202512040144
ISSUE DATE / TIME: 202512040144
ISSUE DATE / TIME: 202512040144
ISSUE DATE / TIME: 202512031526
ISSUE DATE / TIME: 202512031526
Unit Type and Rh: 6200
Unit Type and Rh: 6200
Unit Type and Rh: 6200
Unit Type and Rh: 6200
Unit Type and Rh: 6200
Unit Type and Rh: 6200
Unit Type and Rh: 6200
Unit Type and Rh: 6200
Unit Type and Rh: 6200
Unit Type and Rh: 6200
Unit Type and Rh: 6200

## 2024-10-27 LAB — BLOOD GAS, ARTERIAL
Acid-Base Excess: 0.9 mmol/L (ref 0.0–2.0)
Bicarbonate: 26 mmol/L (ref 20.0–28.0)
Drawn by: 30136
FIO2: 30 %
MECHVT: 420 mL
O2 Saturation: 100 %
PEEP: 5 cmH2O
Patient temperature: 37.3
RATE: 14 {breaths}/min
pCO2 arterial: 44 mmHg (ref 32–48)
pH, Arterial: 7.39 (ref 7.35–7.45)
pO2, Arterial: 95 mmHg (ref 83–108)

## 2024-10-27 LAB — COMPREHENSIVE METABOLIC PANEL WITH GFR
ALT: 21 U/L (ref 0–44)
AST: 83 U/L — ABNORMAL HIGH (ref 15–41)
Albumin: 2.2 g/dL — ABNORMAL LOW (ref 3.5–5.0)
Alkaline Phosphatase: 94 U/L (ref 38–126)
Anion gap: 13 (ref 5–15)
BUN: 36 mg/dL — ABNORMAL HIGH (ref 8–23)
CO2: 27 mmol/L (ref 22–32)
Calcium: 7.7 mg/dL — ABNORMAL LOW (ref 8.9–10.3)
Chloride: 103 mmol/L (ref 98–111)
Creatinine, Ser: 2.85 mg/dL — ABNORMAL HIGH (ref 0.44–1.00)
GFR, Estimated: 17 mL/min — ABNORMAL LOW (ref >60)
Glucose, Bld: 88 mg/dL (ref 70–99)
Potassium: 2.9 mmol/L — ABNORMAL LOW (ref 3.5–5.1)
Sodium: 143 mmol/L (ref 135–145)
Total Bilirubin: 0.3 mg/dL (ref 0.0–1.2)
Total Protein: 4.4 g/dL — ABNORMAL LOW (ref 6.5–8.1)

## 2024-10-27 LAB — MAGNESIUM: Magnesium: 2.7 mg/dL — ABNORMAL HIGH (ref 1.7–2.4)

## 2024-10-27 LAB — HEMOGLOBIN AND HEMATOCRIT, BLOOD
HCT: 26.2 % — ABNORMAL LOW (ref 36.0–46.0)
HCT: 26.9 % — ABNORMAL LOW (ref 36.0–46.0)
Hemoglobin: 8.9 g/dL — ABNORMAL LOW (ref 12.0–15.0)
Hemoglobin: 8.8 g/dL — ABNORMAL LOW (ref 12.0–15.0)

## 2024-10-27 LAB — PREPARE FRESH FROZEN PLASMA
Unit division: 0
Unit division: 0

## 2024-10-27 LAB — BPAM FFP
Blood Product Expiration Date: 202512062359
Blood Product Expiration Date: 202512062359
Unit Type and Rh: 6200
Unit Type and Rh: 6200

## 2024-10-27 LAB — GLUCOSE, CAPILLARY
Glucose-Capillary: 113 mg/dL — ABNORMAL HIGH (ref 70–99)
Glucose-Capillary: 123 mg/dL — ABNORMAL HIGH (ref 70–99)
Glucose-Capillary: 126 mg/dL — ABNORMAL HIGH (ref 70–99)
Glucose-Capillary: 83 mg/dL (ref 70–99)
Glucose-Capillary: 89 mg/dL (ref 70–99)
Glucose-Capillary: 90 mg/dL (ref 70–99)
Glucose-Capillary: 93 mg/dL (ref 70–99)

## 2024-10-27 LAB — PHOSPHORUS: Phosphorus: 5.5 mg/dL — ABNORMAL HIGH (ref 2.5–4.6)

## 2024-10-27 LAB — CBC
HCT: 26.2 % — ABNORMAL LOW (ref 36.0–46.0)
Hemoglobin: 8.8 g/dL — ABNORMAL LOW (ref 12.0–15.0)
MCH: 31.2 pg (ref 26.0–34.0)
MCHC: 33.6 g/dL (ref 30.0–36.0)
MCV: 92.9 fL (ref 80.0–100.0)
Platelets: 71 K/uL — ABNORMAL LOW (ref 150–400)
RBC: 2.82 MIL/uL — ABNORMAL LOW (ref 3.87–5.11)
RDW: 14.7 % (ref 11.5–15.5)
WBC: 6.5 K/uL (ref 4.0–10.5)
nRBC: 0 % (ref 0.0–0.2)

## 2024-10-27 LAB — AMMONIA: Ammonia: 17 umol/L (ref 9–35)

## 2024-10-27 MED ORDER — LACTATED RINGERS IV SOLN: INTRAVENOUS | Status: AC

## 2024-10-27 MED ORDER — POTASSIUM CHLORIDE 20 MEQ PO PACK
40.0000 meq | PACK | Freq: Two times a day (BID) | ORAL | Status: AC
  Administered 2024-10-27 (×2): 40 meq
  Filled 2024-10-27 (×2): qty 2

## 2024-10-27 MED ORDER — INFLUENZA VAC SPLIT HIGH-DOSE 0.5 ML IM SUSY
0.5000 mL | PREFILLED_SYRINGE | INTRAMUSCULAR | Status: DC
  Filled 2024-10-27: qty 0.5

## 2024-10-27 MED ORDER — AMLODIPINE BESYLATE 5 MG PO TABS
5.0000 mg | ORAL_TABLET | Freq: Every day | ORAL | Status: DC
  Administered 2024-10-27 – 2024-11-04 (×9): 5 mg
  Filled 2024-10-27 (×9): qty 1

## 2024-10-27 MED ORDER — VITAL HP 1.0 CAL PO LIQD
1000.0000 mL | ORAL | Status: DC
  Administered 2024-10-27: 1000 mL

## 2024-10-27 NOTE — Plan of Care (Signed)
  Problem: Clinical Measurements: Goal: Ability to maintain clinical measurements within normal limits will improve Outcome: Progressing Goal: Diagnostic test results will improve Outcome: Progressing   Problem: Nutrition: Goal: Adequate nutrition will be maintained Outcome: Progressing   Problem: Health Behavior/Discharge Planning: Goal: Ability to manage health-related needs will improve Outcome: Not Progressing   Problem: Clinical Measurements: Goal: Respiratory complications will improve Outcome: Not Progressing   Problem: Activity: Goal: Risk for activity intolerance will decrease Outcome: Not Progressing

## 2024-10-27 NOTE — Progress Notes (Signed)
 NAME:  Erin [name], MRN:  [id number], DOB:  12/08/1955, LOS: 4 ADMISSION DATE:  10/23/2024, CONSULTATION DATE:  10/23/24 REFERRING MD:  surgery CHIEF COMPLAINT:  hypotension   History of Present Illness:  68 yo female presented for elective hernia repair. Post operatively pt remained hypotensive on pressor. Pt is laying in bed calling out in pain of legs and abdomen. Feels that she needs to keep her legs bent. Appears very pale in skin and mucosal membranes. Pt is oriented but has requested all questions are directed to her husband at this time. Pt does endorse dizziness, despite being supine and feeling weak and cold. Otherwise ROS negative or pt is not able to verbalize other symptoms.    Husband on the phone states that leg pain is chronic and typically has been 2/2 low mag and potassium for which she is reportedly on supplementation for. Husband also states that she recently (10/14) had mesenteric artery stenting and he is concerned that this is contributing to her presentation post operatively from the hernia repair. I have discussed with him plan for cvc, repeat labs and likely cta to eval for active bleed. He agrees with plan and consents to further blood product transfusion.    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  Significant Hospital Events: Including procedures, antibiotic start and stop dates in addition to other pertinent events   12/3 admitted to ICU postop 12/3-post hernia repair 12/4 taken back to the OR for hemoperitoneum-active bleed was not identified, abdomen packed with wound VAC in place 12/5 back to the OR for an open abdomen-no evidence of ongoing bleeding and abdomen was closed   Interim History / Subjective:  Encephalopathic Minimal response -She does open her eyes to name  calling -Not able to squeeze hands Spouse was able to get her to blink She did receive Dilaudid  this morning for ventilator dyssynchrony  Objective    Blood pressure (!) 162/46, pulse 65, temperature 99.1 F (37.3 C), resp. rate (!) 23, height 5' 3 (1.6 m), weight 44.4 kg, SpO2 96%.    Vent Mode: PRVC FiO2 (%):  [30 %] 30 % Set Rate:  [14 bmp] 14 bmp Vt Set:  [420 mL] 420 mL PEEP:  [5 cmH20] 5 cmH20 Pressure Support:  [10 cmH20] 10 cmH20 Plateau Pressure:  [11 cmH20-15 cmH20] 15 cmH20   Intake/Output Summary (Last 24 hours) at 10/27/2024 9081 Last data filed at 10/27/2024 0540 Gross per 24 hour  Intake 500.06 ml  Output 1735 ml  Net -1234.94 ml   Filed Weights   10/23/24 0658 10/23/24 0709  Weight: 44.4 kg 44.4 kg   Fentanyl  pushes as needed  Examination: General: Middle-aged, does not appear to be in distress, acute on chronically ill-appearing HENT: Moist oral mucosa, endotracheal tube in place Lungs: Fair air entry bilaterally Cardiovascular: S1-S2 appreciated, no murmur Abdomen: Postsurgical abdomen Extremities: Skin is warm and dry Neuro: Minimal response GU:   I reviewed last 24 h vitals and pain scores, last 48 h intake and output, last 24 h labs and trends, and last 24 h imaging results.  Received transfusion 10/24/2024 Cryoprecipitate-2 pools FFP-2 units Blood-3 units  Potassium 2.9-being repleted BUN of 36, creatinine of 2.85 Hemoglobin of 8.9, hematocrit of 26.2-stable  Resolved problem list   Assessment and Plan   Hemorrhagic shock Received blood  products Concern for DIC - Resolved -No active bleeding, platelets recovering  Post exploratory laparotomy Abdomen is now closed, mesh was explanted  Hypertension - Home meds been added amlodipine  and Coreg   Acute kidney injury This may be secondary to contrast - Maintain renal perfusion - Avoid nephrotoxic medications -Appears to be stabilizing  Leave off sedation as tolerated  Wean as  tolerated  Fever trend this downwards,  Increased tracheal secretions - Cultures requested-abundant gram-negative rods, few gram-positive cocci -Monitor - On Rocephin -today will be day 2  Nutrition - Start trickle feeds  Updated spouse at bedside  Labs   CBC: Recent Labs  Lab 10/23/24 1636 10/23/24 2233 10/24/24 0610 10/24/24 1139 10/24/24 1500 10/24/24 1940 10/24/24 2151 10/24/24 2353 10/25/24 0507 10/25/24 1054 10/25/24 2043 10/26/24 0414 10/26/24 1200 10/26/24 2042 10/27/24 0506  WBC 11.7*   < > 7.4  --  8.8  --   --   --  8.8  --   --  6.4  --   --  6.5  NEUTROABS 10.1*  --   --   --   --   --   --   --   --   --   --  5.4  --   --   --   HGB 8.1*   < > 11.4*   < > 11.6*   < >  --    < > 10.4*   < > 9.2* 9.1* 9.4* 9.0* 8.8*  8.9*  HCT 25.2*   < > 31.4*   < > 32.2*   < >  --    < > 28.5*   < > 26.8* 26.5* 27.6* 26.8* 26.2*  26.2*  MCV 89.0   < > 84.4  --  84.1  --   --   --  84.8  --   --  90.1  --   --  92.9  PLT 117*   < > 26*  26*  --  34*  36*  --  38*  --  42*  44*  --   --  52*  --   --  71*   < > = values in this interval not displayed.    Basic Metabolic Panel: Recent Labs  Lab 10/23/24 2233 10/24/24 0610 10/25/24 0507 10/25/24 1524 10/26/24 0414 10/26/24 0815 10/27/24 0506  NA 139 140 140 140 141  --  143  K 5.3* 3.8 3.2* 2.8* 3.1*  --  2.9*  CL 115* 106 100 102 101  --  103  CO2 14* 22 31 28 29   --  27  GLUCOSE 203* 130* 117* 102* 98  --  88  BUN 22 22 27* 27* 30*  --  36*  CREATININE 1.81* 1.93* 2.75* 2.65* 2.88*  --  2.85*  CALCIUM  5.7* 6.8* 6.8* 6.4* 7.4*  --  7.7*  MG 1.6*  --   --   --  2.8* 2.6* 2.7*  PHOS  --   --   --  5.2* 6.0* 5.5* 5.5*   GFR: Estimated Creatinine Clearance: 13.2 mL/min (A) (by C-G formula based on SCr of 2.85 mg/dL (H)). Recent Labs  Lab 10/23/24 2233 10/24/24 0610 10/24/24 1500 10/25/24 0507 10/26/24 0414 10/27/24 0506  WBC 13.4* 7.4 8.8 8.8 6.4 6.5  LATICACIDVEN 4.7* 1.8  --   --   --   --      Liver Function Tests: Recent Labs  Lab 10/23/24 2233 10/24/24 0610 10/25/24 0507 10/26/24 0414 10/27/24 9493  AST 43* 446* 326* 163* 83*  ALT 26 269* 111* 31 21  ALKPHOS 32* 45 52 74 94  BILITOT <0.2 0.3 <0.2 0.2 0.3  PROT <3.0* 3.8* 3.8* 4.0* 4.4*  ALBUMIN  2.0* 2.6* 2.3* 2.2* 2.2*   No results for input(s): LIPASE, AMYLASE in the last 168 hours. No results for input(s): AMMONIA in the last 168 hours.  ABG    Component Value Date/Time   PHART 7.38 10/24/2024 2144   PCO2ART 46 10/24/2024 2144   PO2ART 76 (L) 10/24/2024 2144   HCO3 31.5 (H) 10/25/2024 1100   TCO2 20 (L) 10/23/2024 1420   ACIDBASEDEF 2.0 08/14/2017 2217   O2SAT 76 10/25/2024 1100     Coagulation Profile: Recent Labs  Lab 10/23/24 2234 10/24/24 0610 10/24/24 1500 10/24/24 2151 10/25/24 0507  INR 1.9* 1.4* 1.2 1.3* 1.3*    Cardiac Enzymes: No results for input(s): CKTOTAL, CKMB, CKMBINDEX, TROPONINI in the last 168 hours.  HbA1C: Hgb A1c MFr Bld  Date/Time Value Ref Range Status  10/18/2024 12:05 PM 4.9 4.8 - 5.6 % Final    Comment:    (NOTE)         Prediabetes: 5.7 - 6.4         Diabetes: >6.4         Glycemic control for adults with diabetes: <7.0   12/10/2021 03:32 PM 5.2 <5.7 % of total Hgb Final    Comment:    For the purpose of screening for the presence of diabetes: . <5.7%       Consistent with the absence of diabetes 5.7-6.4%    Consistent with increased risk for diabetes             (prediabetes) > or =6.5%  Consistent with diabetes . This assay result is consistent with a decreased risk of diabetes. . Currently, no consensus exists regarding use of hemoglobin A1c for diagnosis of diabetes in children. . According to American Diabetes Association (ADA) guidelines, hemoglobin A1c <7.0% represents optimal control in non-pregnant diabetic patients. Different metrics may apply to specific patient populations.  Standards of Medical Care in  Diabetes(ADA). .     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), Anxiety, Aortoiliac occlusive disease (HCC) (08/14/2017), Arthritis, B12 deficiency anemia (09/14/2015), Blood transfusion without reported diagnosis, BPPV (benign paroxysmal positional vertigo) (07/08/2015), Cardiomyopathy (HCC) (11/03/2017), Chronic back pain, Chronic daily headache (03/29/2013), Closed right hip fracture (HCC) (09/10/2015), Coronary artery disease involving native coronary artery of native heart without angina pectoris (10/13/2017), Depression, Essential (hemorrhagic) thrombocythemia (HCC) (09/06/2018), Essential hypertension (09/25/2023), Family history of early CAD (04/08/2016), GERD (gastroesophageal reflux disease), History of hiatal hernia, Hot flashes, Hyperlipidemia, Hyperlipidemia associated with type 2 diabetes mellitus (HCC) (05/11/2017), Hypothyroidism (09/10/2015), IBS (irritable bowel syndrome) (03/29/2013), Neck pain (07/08/2015), 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   Surgical History:   Past Surgical History:  Procedure Laterality Date   ABDOMINAL AORTOGRAM N/A 08/02/2017   Procedure: ABDOMINAL AORTOGRAM;  Surgeon: Darron Deatrice LABOR, MD;  Location: [facility];  Service: Cardiovascular;  Laterality: N/A;   ABDOMINAL AORTOGRAM W/LOWER EXTREMITY Bilateral 09/03/2024   Procedure: ABDOMINAL  AORTOGRAM W/LOWER EXTREMITY;  Surgeon: Serene Gaile ORN, MD;  Location: [facility];  Service: Cardiovascular;  Laterality: Bilateral;   AORTA - BILATERAL  FEMORAL ARTERY BYPASS GRAFT N/A 08/14/2017   Procedure: AORTA BIFEMORAL BYPASS GRAFT;  Surgeon: Oris Krystal FALCON, MD;  Location[facility] OR;  Service: Vascular;  Laterality: N/A;   COLONOSCOPY     COLONOSCOPY N/A 08/29/2024   Procedure: COLONOSCOPY;  Surgeon: Wilhelmenia Aloha Raddle., MD;  Location: [facility];  Service: Gastroenterology;  Laterality: N/A;   CORONARY STENT INTERVENTION N/A 10/13/2017   Procedure: CORONARY STENT INTERVENTION;  Surgeon: Mady Bruckner, MD;  Location: [facility];  Service: Cardiovascular;  Laterality: N/A;   dental implant     ECTOPIC PREGNANCY SURGERY     EMBOLECTOMY N/A 08/14/2017   Procedure: EMBOLECTOMY FEMORAL;  Surgeon: Oris Krystal FALCON, MD;  Location: [facility] OR;  Service: Vascular;  Laterality: N/A;   FEMORAL-POPLITEAL BYPASS GRAFT Right 08/14/2017   Procedure: Right Femoral to Above Knee Popliteal Bypass Graft using Non-Reversed Greater Saphenous Vein Graft from Right Leg;  Surgeon: Oris Krystal FALCON, MD;  Location: [facility] OR;  Service: Vascular;  Laterality: Right;   FLEXIBLE SIGMOIDOSCOPY N/A 08/14/2013   Procedure: FLEXIBLE SIGMOIDOSCOPY;  Surgeon: Gwendlyn ONEIDA Buddy, MD;  Location: [facility];  Service: Endoscopy;  Laterality: N/A;   LAPAROTOMY N/A 10/24/2024   Procedure: EXPLORATORY LAPAROTOMY, ABDOMINAL PACKING AND WOUND VAC APPLICATION;  Surgeon: Debby Hila, MD;  Location: [facility];  Service: General;  Laterality: N/A;  (8) EIGHT 18X18 LAP SPONGE PACKED   LEFT HEART CATH AND CORONARY ANGIOGRAPHY N/A 10/13/2017   Procedure: LEFT HEART CATH AND CORONARY ANGIOGRAPHY;  Surgeon: Mady Bruckner, MD;  Location: [facility];  Service: Cardiovascular;  Laterality: N/A;   LOWER EXTREMITY ANGIOGRAPHY Bilateral 08/02/2017   Procedure: Lower Extremity Angiography;  Surgeon: Darron Deatrice LABOR, MD;  Location: [facility] INVASIVE CV LAB;  Service: Cardiovascular;  Laterality: Bilateral;   LOWER EXTREMITY INTERVENTION N/A 09/03/2024   Procedure: LOWER EXTREMITY INTERVENTION;   Surgeon: Serene Gaile ORN, MD;  Location: [facility];  Service: Cardiovascular;  Laterality: N/A;   MULTIPLE TOOTH EXTRACTIONS     PERIPHERAL VASCULAR INTERVENTION  05/22/2018   Procedure: PERIPHERAL VASCULAR INTERVENTION;  Surgeon: Serene Gaile ORN, MD;  Location: [facility];  Service: Cardiovascular;;  SMA and Celiac   PILONIDAL CYST EXCISION     POLYPECTOMY     THROMBECTOMY FEMORAL ARTERY Right 08/14/2017   Procedure: THROMBECTOMY FEMORAL ARTERY;  Surgeon: Oris Krystal FALCON, MD;  Location: [facility] OR;  Service: Vascular;  Laterality: Right;   TONSILLECTOMY     TOTAL HIP ARTHROPLASTY  09/11/2015   Procedure: TOTAL HIP ARTHROPLASTY;  Surgeon: Evalene JONETTA Chancy, MD;  Location: [facility] OR;  Service: Orthopedics;;   ULTRASOUND GUIDANCE FOR VASCULAR ACCESS  10/13/2017   Procedure: Ultrasound Guidance For Vascular Access;  Surgeon: Mady Bruckner, MD;  Location: [facility];  Service: Cardiovascular;;   VENTRAL HERNIA REPAIR N/A 10/23/2024   Procedure: REPAIR, HERNIA, VENTRAL, LAPAROSCOPIC;  Surgeon: Rubin Calamity, MD;  Location: [facility];  Service: General;  Laterality: N/A;   VISCERAL ANGIOGRAPHY N/A 03/03/2020   Procedure: MESTENRIC ANGIOGRAPHY;  Surgeon: Serene Gaile ORN, MD;  Location: [facility];  Service: Cardiovascular;  Laterality: N/A;   VISCERAL ANGIOGRAPHY N/A 09/03/2024   Procedure: VISCERAL ANGIOGRAPHY;  Surgeon: Serene Gaile ORN, MD;  Location: [facility];  Service: Cardiovascular;  Laterality: N/A;   VISCERAL ARTERY INTERVENTION N/A 09/03/2024  Procedure: VISCERAL ARTERY INTERVENTION;  Surgeon: Serene Gaile ORN, MD;  Location: [facility];  Service: Cardiovascular;  Laterality: N/A;     Social History:   reports that she quit smoking about 10 years ago. Her smoking use included cigarettes. She has never used smokeless tobacco. She reports that she does not drink alcohol and does not use drugs.   Family History:  Her family history includes  Arthritis in her mother; Dementia in her mother; Heart attack in her father, maternal grandfather, maternal uncle, and mother; Heart disease in her father and mother; Hyperlipidemia in her father and mother; Hypertension in her father and mother; Irritable bowel syndrome in her mother; Lung cancer (age of onset: 76) in her brother; Prostate cancer in her father; Stroke (age of onset: 41) in her mother; Stroke (age of onset: 85) in her father; Thyroid  disease in her father and mother. There is no history of Colon cancer, Rectal cancer, Stomach cancer, or Esophageal cancer.   Allergies Allergies  Allergen Reactions   Latex Itching    Must use paper tape   Amitiza  [Lubiprostone ] Diarrhea    Uncontrollable diarrhea   Nortriptyline  Other (See Comments)    Stomach distention   Sulfa Antibiotics Nausea And Vomiting and Other (See Comments)    GI distress/pain   Tramadol  Itching and Rash   Atorvastatin  Nausea And Vomiting   Claritin [Loratadine] Other (See Comments)    Hot flashes    The patient is critically ill with multiple organ systems failure and requires high complexity decision making for assessment and support, frequent evaluation and titration of therapies, application of advanced monitoring technologies and extensive interpretation of multiple databases. Critical Care Time devoted to patient care services described in this note independent of APP/resident time (if applicable)  is 32 minutes.   Jennet Epley MD [facility] Personal pager: See Amion If unanswered, please page CCM On-call: #[phone]

## 2024-10-27 NOTE — Plan of Care (Signed)

## 2024-10-27 NOTE — Progress Notes (Signed)
 [facility] Progress Note   Subjective:   Stable this AM.  No pressors. Tm 100 , WBC 6.5.   I/Os yesterday 0.5/ 1.7 (UOP , 0.8L NG outpt).  Trickle feeds being initiated.  Husband bedside  Objective Vitals:   10/27/24 0700 10/27/24 0750 10/27/24 0808 10/27/24 0814  BP: (!) 132/40  (!) 162/46 (!) 162/46  Pulse: 71  65   Resp: (!) 23     Temp: 99.1 F (37.3 C)     TempSrc:  Rectal    SpO2: 96%     Weight:      Height:       Physical Exam Gen: thin woman intubated on vent  ENT:ETT in place Neck: L internal jugular triple lumen central line CV: RRR Abd:  abd binder in place Lungs: coarse ant on vent GU: foley draining normal yellow urine Extr: no edema Neuro:sedated but arousable per RN  Additional Objective Labs: Basic Metabolic Panel: Recent Labs  Lab 10/25/24 1524 10/26/24 0414 10/26/24 0815 10/27/24 0506  NA 140 141  --  143  K 2.8* 3.1*  --  2.9*  CL 102 101  --  103  CO2 28 29  --  27  GLUCOSE 102* 98  --  88  BUN 27* 30*  --  36*  CREATININE 2.65* 2.88*  --  2.85*  CALCIUM  6.4* 7.4*  --  7.7*  PHOS 5.2* 6.0* 5.5* 5.5*   Liver Function Tests: Recent Labs  Lab 10/25/24 0507 10/26/24 0414 10/27/24 0506  AST 326* 163* 83*  ALT 111* 31 21  ALKPHOS 52 74 94  BILITOT <0.2 0.2 0.3  PROT 3.8* 4.0* 4.4*  ALBUMIN  2.3* 2.2* 2.2*   No results for input(s): LIPASE, AMYLASE in the last 168 hours. CBC: Recent Labs  Lab 10/23/24 1636 10/23/24 2233 10/24/24 0610 10/24/24 1139 10/24/24 1500 10/24/24 1940 10/25/24 0507 10/25/24 1054 10/26/24 0414 10/26/24 1200 10/26/24 2042 10/27/24 0506  WBC 11.7*   < > 7.4  --  8.8  --  8.8  --  6.4  --   --  6.5  NEUTROABS 10.1*  --   --   --   --   --   --   --  5.4  --   --   --   HGB 8.1*   < > 11.4*   < > 11.6*   < > 10.4*   < > 9.1* 9.4* 9.0* 8.8*  8.9*  HCT 25.2*   < > 31.4*   < > 32.2*   < > 28.5*   < > 26.5* 27.6* 26.8* 26.2*  26.2*  MCV 89.0   < > 84.4  --  84.1  --  84.8  --   90.1  --   --  92.9  PLT 117*   < > 26*  26*  --  34*  36*   < > 42*  44*  --  52*  --   --  71*   < > = values in this interval not displayed.   Blood Culture    Component Value Date/Time   SDES  10/26/2024 1825    TRACHEAL ASPIRATE Performed at [facility], [address] [place], [place] [address]    SPECREQUEST  10/26/2024 1825    NONE Performed at [facility] [address] [place], [place] [address]    CULT PENDING 10/26/2024 1825   REPTSTATUS PENDING 10/26/2024 1825    Cardiac Enzymes: No  results for input(s): CKTOTAL, CKMB, CKMBINDEX, TROPONINI in the last 168 hours. CBG: Recent Labs  Lab 10/26/24 1145 10/26/24 2017 10/27/24 0012 10/27/24 0518 10/27/24 0805  GLUCAP 99 96 93 89 83   Iron Studies: No results for input(s): IRON, TIBC, TRANSFERRIN, FERRITIN in the last 72 hours. @lablastinr3 @ Studies/Results: DG Abd 1 View Result Date: 10/26/2024 EXAM: 1 VIEW XRAY OF THE ABDOMEN 10/26/2024 10:18:00 PM COMPARISON: 10/24/2024 CLINICAL HISTORY: [id number] Encounter for imaging study to confirm nasogastric (NG) tube placement [phone] Encounter for imaging study to confirm nasogastric (NG) tube placement FINDINGS: LINES, TUBES AND DEVICES: NG tube coils in the fundus of the stomach. BOWEL: Nonobstructive bowel gas pattern. SOFT TISSUES: Skin staples noted to the right of midline. No abnormal calcifications. BONES: No acute fracture. IMPRESSION: 1. NG tube tip projects over the fundus of the stomach. 2. Nonobstructive bowel gas pattern. Electronically signed by: Franky Crease MD 10/26/2024 10:23 PM EST RP Workstation: [id number]   DG Chest Port 1 View Result Date: 10/26/2024 EXAM: 1 VIEW(S) XRAY OF THE CHEST 10/26/2024 08:29:42 PM COMPARISON: 10/25/2024 CLINICAL HISTORY: Respiratory failure (HCC) FINDINGS: LINES, TUBES AND DEVICES: Endotracheal tube in place with tip 1 cm above the carina. Enteric tube coiled within the  gastric fundus. Stable left IJ approach central venous catheter with tip at the superior cavoatrial junction. LUNGS AND PLEURA: Small right pleural effusion with right basilar opacities, not significantly changed. No pneumothorax. HEART AND MEDIASTINUM: Atherosclerotic plaque. No acute abnormality of the cardiac and mediastinal silhouettes. BONES AND SOFT TISSUES: Upper abdominal surgical staples noted. No acute osseous abnormality. IMPRESSION: 1. Small right pleural effusion with persistent right basilar opacities, unchanged. 2. Enteric tube coiled within the stomach. Consider retracting by 3cm. Electronically signed by: Morgane Naveau MD 10/26/2024 08:34 PM EST RP Workstation: [id number]   DG CHEST PORT 1 VIEW Result Date: 10/25/2024 EXAM: 1 VIEW(S) XRAY OF THE CHEST 10/25/2024 10:46:00 AM COMPARISON: Yesterday. CLINICAL HISTORY: [id number] History of endotracheal intubation [id number] History of endotracheal intubation FINDINGS: LINES, TUBES AND DEVICES: Endotracheal and nasogastric tubes are appropriately in good position. Left internal jugular catheter is unchanged. LUNGS AND PLEURA: Mild right basilar atelectasis is noted. Probable small pleural effusion. No pneumothorax. HEART AND MEDIASTINUM: No acute abnormality of the cardiac and mediastinal silhouettes. BONES AND SOFT TISSUES: No acute osseous abnormality. IMPRESSION: 1. Mild right basilar atelectasis with probable small pleural effusion. Electronically signed by: Lynwood Seip MD 10/25/2024 11:21 AM EST RP Workstation: [id number]   Medications:  cefTRIAXone  (ROCEPHIN )  IV Stopped (10/26/24 1835)   propofol  (DIPRIVAN ) infusion Stopped (10/26/24 0612)    amLODipine   5 mg Per Tube Daily   carvedilol   12.5 mg Per Tube BID WC   Chlorhexidine  Gluconate Cloth  6 each Topical Daily   famotidine   20 mg Per Tube Daily   feeding supplement (VITAL HIGH PROTEIN)  1,000 mL Per Tube Q24H   fentaNYL  (SUBLIMAZE ) injection  25-50 mcg Intravenous Once   gabapentin    100 mg Per Tube TID   levothyroxine   75 mcg Per Tube Q0600   mouth rinse  15 mL Mouth Rinse Q2H   potassium chloride   40 mEq Per Tube BID    Assessment/Plan: Erin [name] is an 68 y.o. 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    **AKI:  mild CKD (Cr 1.1-1.3) at  baseline now with AKI following shock secondary to ABLA and contrast exposure.  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  I suspect main etiology of AKI is ATN and contrast mediated and would not recommend pursing the L RAS at this time.  Cr from 1.5 admission to 2.88 yesterday  to 2.85 today. Volume and lytes are ok, nonoliguric.  No current indications for RRT but should they arise husband consents to proceed.  For now continue max supportive care maintaining euvolemia - will cont MIVF for now (reordered LR 100/hr x24h today), avoid nephrotoxins, avoid extremes of BP.  Will follow daily labs and UOP.      **Anemia: ABLA secondary post op hemorrahge.  Stabilized, Hb 8.8 this AM from 9 yesterday.   **Hypokalemia: K 2.9 this AM, being repleted.   **R renal wedge shaped infarct:  suspect this is due to vascular dz + profound shock.  No other localizing sign of embolic issues at this time.     Will follow closely with you, please reach out with any concerns.   Manuelita Barters MD 10/27/2024, 9:48 AM  [facility] Pager: [phone]

## 2024-10-27 NOTE — Progress Notes (Addendum)
 2 Days Post-Op   Interval: Still without purposeful movements off of sedation. Rocephin  started empirically. Low grade fevers to 99.9 throughout night. Required PRNs for BP when agitated   Objective: Vital signs in last 24 hours: Temp:  [99.3 F (37.4 C)-100.8 F (38.2 C)] 99.3 F (37.4 C) (12/07 0645) Pulse Rate:  [64-98] 70 (12/07 0645) Resp:  [11-44] 15 (12/07 0645) BP: (134-210)/(41-72) 138/41 (12/07 0630) SpO2:  [93 %-100 %] 96 % (12/07 0645) Arterial Line BP: (126-216)/(45-122) 148/122 (12/07 0530) FiO2 (%):  [30 %] 30 % (12/07 0439) Last BM Date :  (pta)  Intake/Output from previous day: 12/06 0701 - 12/07 0700 In: 400.1 [NG/GT:300; IV Piggyback:100.1] Out: 1435 [Urine:910; Emesis/NG output:525] Intake/Output this shift: No intake/output data recorded.  Gen: No purposeful movements, unresponsive HEENT: NGT with 525 over last 24h CArdio: RRR, BP hypertensive Abd: Honeycomb dressing in place, incision c/d/i  Lab Results:  Recent Labs    10/25/24 0507 10/25/24 1054 10/26/24 0414 10/26/24 1200 10/26/24 2042 10/27/24 0506  WBC 8.8  --  6.4  --   --   --   HGB 10.4*   < > 9.1*   < > 9.0* 8.9*  HCT 28.5*   < > 26.5*   < > 26.8* 26.2*  PLT 42*  44*  --  52*  --   --   --    < > = values in this interval not displayed.   BMET Recent Labs    10/26/24 0414 10/27/24 0506  NA 141 143  K 3.1* 2.9*  CL 101 103  CO2 29 27  GLUCOSE 98 88  BUN 30* 36*  CREATININE 2.88* 2.85*  CALCIUM  7.4* 7.7*   PT/INR Recent Labs    10/24/24 2151 10/25/24 0507  LABPROT 16.5* 16.6*  INR 1.3* 1.3*   ABG Recent Labs    10/24/24 2144 10/25/24 1100  PHART 7.38  --   HCO3 27.3 31.5*    Studies/Results: DG Abd 1 View Result Date: 10/26/2024 EXAM: 1 VIEW XRAY OF THE ABDOMEN 10/26/2024 10:18:00 PM COMPARISON: 10/24/2024 CLINICAL HISTORY: [id number] Encounter for imaging study to confirm nasogastric (NG) tube placement [id number] Encounter for imaging study to confirm nasogastric  (NG) tube placement FINDINGS: LINES, TUBES AND DEVICES: NG tube coils in the fundus of the stomach. BOWEL: Nonobstructive bowel gas pattern. SOFT TISSUES: Skin staples noted to the right of midline. No abnormal calcifications. BONES: No acute fracture. IMPRESSION: 1. NG tube tip projects over the fundus of the stomach. 2. Nonobstructive bowel gas pattern. Electronically signed by: Franky Crease MD 10/26/2024 10:23 PM EST RP Workstation: [id number]   DG Chest Port 1 View Result Date: 10/26/2024 EXAM: 1 VIEW(S) XRAY OF THE CHEST 10/26/2024 08:29:42 PM COMPARISON: 10/25/2024 CLINICAL HISTORY: Respiratory failure (HCC) FINDINGS: LINES, TUBES AND DEVICES: Endotracheal tube in place with tip 1 cm above the carina. Enteric tube coiled within the gastric fundus. Stable left IJ approach central venous catheter with tip at the superior cavoatrial junction. LUNGS AND PLEURA: Small right pleural effusion with right basilar opacities, not significantly changed. No pneumothorax. HEART AND MEDIASTINUM: Atherosclerotic plaque. No acute abnormality of the cardiac and mediastinal silhouettes. BONES AND SOFT TISSUES: Upper abdominal surgical staples noted. No acute osseous abnormality. IMPRESSION: 1. Small right pleural effusion with persistent right basilar opacities, unchanged. 2. Enteric tube coiled within the stomach. Consider retracting by 3cm. Electronically signed by: Morgane Naveau MD 10/26/2024 08:34 PM EST RP Workstation: [id number]   DG CHEST PORT 1 VIEW Result Date: 10/25/2024  EXAM: 1 VIEW(S) XRAY OF THE CHEST 10/25/2024 10:46:00 AM COMPARISON: Yesterday. CLINICAL HISTORY: [id number] History of endotracheal intubation [id number] History of endotracheal intubation FINDINGS: LINES, TUBES AND DEVICES: Endotracheal and nasogastric tubes are appropriately in good position. Left internal jugular catheter is unchanged. LUNGS AND PLEURA: Mild right basilar atelectasis is noted. Probable small pleural effusion. No pneumothorax. HEART  AND MEDIASTINUM: No acute abnormality of the cardiac and mediastinal silhouettes. BONES AND SOFT TISSUES: No acute osseous abnormality. IMPRESSION: 1. Mild right basilar atelectasis with probable small pleural effusion. Electronically signed by: Lynwood Seip MD 10/25/2024 11:21 AM EST RP Workstation: [id number]    Anti-infectives: Anti-infectives (From admission, onward)    Start     Dose/Rate Route Frequency Ordered Stop   10/26/24 1800  cefTRIAXone  (ROCEPHIN ) 2 g in sodium chloride  0.9 % 100 mL IVPB        2 g 200 mL/hr over 30 Minutes Intravenous Daily-1800 10/26/24 1739     10/23/24 0700  ceFAZolin  (ANCEF ) IVPB 2g/100 mL premix        2 g 200 mL/hr over 30 Minutes Intravenous On call to O.R. 10/23/24 9351 10/23/24 9166       Assessment/Plan: s/p Procedure(s) with comments: EXPLORATORY LAPAROTOMY, ABDOMINAL PACKING AND WOUND VAC APPLICATION (N/A) - (8) EIGHT 18X18 LAP SPONGE PACKED   POD 4 from lap VHR and reexploration for bleeding, POD 2 from reexploration and abdominal closure - Nephrology following for AKI, appreciate recommendations - Will replace potassium this AM - CCM assisting with management of critically ill patient, appreciate assistance  - Has remained off sedation but still unresponsive. Not appropriate for extubation until waking up and alert/following commands - Continue abdominal binder - Okay to start trickle TF today - H/H has been relatively stable, can check PM H/H today and if stable can be just daily CBC     No family at bedside this AM.  LOS: 4 days    Richerd Silversmith 10/27/2024

## 2024-10-27 NOTE — Progress Notes (Signed)
 Still with very minimal interaction  Will take off Dilaudid  Can have fentanyl  every 2 for agitation  Limit sedating medications as best as we can  Labs ordered for a.m.

## 2024-10-28 ENCOUNTER — Inpatient Hospital Stay (HOSPITAL_COMMUNITY)

## 2024-10-28 ENCOUNTER — Inpatient Hospital Stay (HOSPITAL_COMMUNITY)
Admission: AD | Admit: 2024-10-28 | Discharge: 2024-10-28 | Disposition: A | Source: Home / Self Care | Attending: Pulmonary Disease | Admitting: Pulmonary Disease

## 2024-10-28 ENCOUNTER — Encounter (HOSPITAL_COMMUNITY): Payer: Self-pay | Admitting: General Surgery

## 2024-10-28 LAB — RENAL FUNCTION PANEL
Albumin: 2.1 g/dL — ABNORMAL LOW (ref 3.5–5.0)
Anion gap: 11 (ref 5–15)
BUN: 37 mg/dL — ABNORMAL HIGH (ref 8–23)
CO2: 25 mmol/L (ref 22–32)
Calcium: 7.7 mg/dL — ABNORMAL LOW (ref 8.9–10.3)
Chloride: 108 mmol/L (ref 98–111)
Creatinine, Ser: 2.65 mg/dL — ABNORMAL HIGH (ref 0.44–1.00)
GFR, Estimated: 19 mL/min — ABNORMAL LOW (ref >60)
Glucose, Bld: 119 mg/dL — ABNORMAL HIGH (ref 70–99)
Phosphorus: 3.6 mg/dL (ref 2.5–4.6)
Potassium: 3.3 mmol/L — ABNORMAL LOW (ref 3.5–5.1)
Sodium: 144 mmol/L (ref 135–145)

## 2024-10-28 LAB — ECHOCARDIOGRAM COMPLETE
AR max vel: 1.5 cm2
AV Area VTI: 1.54 cm2
AV Area mean vel: 1.55 cm2
AV Mean grad: 4 mmHg
AV Peak grad: 8.2 mmHg
Ao pk vel: 1.43 m/s
Area-P 1/2: 3.89 cm2
Calc EF: 64.6 %
Height: 63 in
MV VTI: 1.37 cm2
S' Lateral: 2.5 cm
Single Plane A2C EF: 70.6 %
Single Plane A4C EF: 55.4 %
Weight: 1901.25 [oz_av]

## 2024-10-28 LAB — CBC
HCT: 24.4 % — ABNORMAL LOW (ref 36.0–46.0)
Hemoglobin: 8 g/dL — ABNORMAL LOW (ref 12.0–15.0)
MCH: 30.7 pg (ref 26.0–34.0)
MCHC: 32.8 g/dL (ref 30.0–36.0)
MCV: 93.5 fL (ref 80.0–100.0)
Platelets: 82 K/uL — ABNORMAL LOW (ref 150–400)
RBC: 2.61 MIL/uL — ABNORMAL LOW (ref 3.87–5.11)
RDW: 14.9 % (ref 11.5–15.5)
WBC: 6.6 K/uL (ref 4.0–10.5)
nRBC: 0 % (ref 0.0–0.2)

## 2024-10-28 LAB — MAGNESIUM
Magnesium: 2.4 mg/dL (ref 1.7–2.4)
Magnesium: 2.2 mg/dL (ref 1.7–2.4)

## 2024-10-28 LAB — GLUCOSE, CAPILLARY
Glucose-Capillary: 128 mg/dL — ABNORMAL HIGH (ref 70–99)
Glucose-Capillary: 138 mg/dL — ABNORMAL HIGH (ref 70–99)
Glucose-Capillary: 137 mg/dL — ABNORMAL HIGH (ref 70–99)
Glucose-Capillary: 131 mg/dL — ABNORMAL HIGH (ref 70–99)
Glucose-Capillary: 125 mg/dL — ABNORMAL HIGH (ref 70–99)
Glucose-Capillary: 117 mg/dL — ABNORMAL HIGH (ref 70–99)

## 2024-10-28 LAB — CULTURE, RESPIRATORY W GRAM STAIN

## 2024-10-28 LAB — PHOSPHORUS: Phosphorus: 3.1 mg/dL (ref 2.5–4.6)

## 2024-10-28 MED ORDER — VITAL HP 1.0 CAL PO LIQD
1000.0000 mL | ORAL | Status: AC
  Administered 2024-10-28: 1000 mL

## 2024-10-28 MED ORDER — SODIUM CHLORIDE 0.9 % IV SOLN
2.0000 g | INTRAVENOUS | Status: DC
  Administered 2024-10-28 – 2024-10-29 (×2): 2 g via INTRAVENOUS
  Filled 2024-10-28 (×2): qty 12.5

## 2024-10-28 MED ORDER — ASPIRIN 81 MG PO CHEW
81.0000 mg | CHEWABLE_TABLET | Freq: Every day | ORAL | Status: DC
  Administered 2024-10-28 – 2024-11-13 (×17): 81 mg
  Filled 2024-10-28 (×19): qty 1

## 2024-10-28 MED ORDER — VITAL HP 1.0 CAL PO LIQD: 1000.0000 mL | ORAL | Status: AC

## 2024-10-28 MED ORDER — POTASSIUM CHLORIDE 10 MEQ/100ML IV SOLN
10.0000 meq | INTRAVENOUS | Status: AC
  Administered 2024-10-28 (×4): 10 meq via INTRAVENOUS
  Filled 2024-10-28 (×4): qty 100

## 2024-10-28 MED ORDER — STROKE: EARLY STAGES OF RECOVERY BOOK
Freq: Once | Status: AC
  Filled 2024-10-28 (×2): qty 1

## 2024-10-28 MED ORDER — VITAL AF 1.2 CAL PO LIQD
1000.0000 mL | ORAL | Status: DC
  Administered 2024-10-28: 1000 mL

## 2024-10-28 MED ORDER — FUROSEMIDE 10 MG/ML IJ SOLN
40.0000 mg | Freq: Once | INTRAMUSCULAR | Status: AC
  Administered 2024-10-29: 40 mg via INTRAVENOUS
  Filled 2024-10-28: qty 4

## 2024-10-28 MED ORDER — HEPARIN SODIUM (PORCINE) 5000 UNIT/ML IJ SOLN
5000.0000 [IU] | Freq: Three times a day (TID) | INTRAMUSCULAR | Status: DC
  Administered 2024-10-28 – 2024-11-18 (×63): 5000 [IU] via SUBCUTANEOUS
  Filled 2024-10-28 (×61): qty 1

## 2024-10-28 MED ORDER — PANTOPRAZOLE SODIUM 40 MG IV SOLR
40.0000 mg | Freq: Every day | INTRAVENOUS | Status: DC
  Administered 2024-10-28 – 2024-11-21 (×25): 40 mg via INTRAVENOUS
  Filled 2024-10-28 (×21): qty 10

## 2024-10-28 NOTE — Consult Note (Signed)
 NEUROLOGY CONSULT NOTE   Date of service: October 28, 2024 Patient Name: Erin [name] MRN:  [id number] DOB:  [birth year] Chief Complaint: Difficulty arousing after surgery, strokes on CT head Requesting Provider: Rubin Calamity, MD  History of Present Illness  Erin [name] is a 68 y.o. female with hx of coronary artery disease, hypertension, hyperlipidemia, squamous cell carcinoma of the anus-admitted to the ICU was in the hospital for management of hemorrhagic shock with concern for DIC.  She initially was admitted for abdominal wall hernia repair, complicated by hypotension likely related to hemorrhagic shock with last known well somewhere around 10/23/2024.  On 10/23/2024, postoperatively, she had remained awake and alert but complaining of excessive leg pain and had a clinical course complicated by hypotension followed by AKI on CKD with further workup revealing right renal infarct.  Her mentation did not improve over the last few days which prompted head imaging in the form of CT head which revealed multifocal infarcts-which was the reason for neurological consultation. Patient unable to provide any history at this time.  Husband at bedside.  LKW: Sometime on 10/23/2024 Modified rankin score: 0-Completely asymptomatic and back to baseline post- stroke IV Thrombolysis: Outside the window EVT: Outside the window  NIHSS components Score: Comment  1a Level of Conscious 0[]  1[]  2[]  3[x]      1b LOC Questions 0[]  1[]  2[x]       1c LOC Commands 0[]  1[]  2[x]       2 Best Gaze 0[x]  1[]  2[]       3 Visual 0[]  1[]  2[]  3[x]      4 Facial Palsy 0[]  1[]  2[]  3[x]      5a Motor Arm - left 0[]  1[]  2[]  3[x]  4[]  UN[]    5b Motor Arm - Right 0[]  1[]  2[]  3[x]  4[]  UN[]    6a Motor Leg - Left 0[]  1[]  2[]  3[x]  4[]  UN[]    6b Motor Leg - Right 0[]  1[]  2[]  3[x]  4[]  UN[]    7 Limb Ataxia 0[x]  1[]  2[]  UN[]      8 Sensory 0[x]  1[]  2[]  UN[]      9 Best Language 0[]  1[]  2[]  3[x]      10 Dysarthria 0[]  1[]  2[x]  UN[]       11 Extinct. and Inattention 0[x]  1[]  2[]       TOTAL: 30      ROS  Unable to ascertain due to her mentation  Past History   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/14 60.4Gy/32fx and chemo   Anxiety    Aortoiliac occlusive disease (HCC) 08/14/2017   Arthritis    B12 deficiency anemia 09/14/2015   Blood transfusion without reported diagnosis    BPPV (benign paroxysmal positional vertigo) 07/08/2015   Cardiomyopathy (HCC) 11/03/2017   Chronic back pain    Chronic daily headache 03/29/2013   takes bc powder   Closed right hip fracture (HCC) 09/10/2015   Coronary artery disease involving native coronary artery of native heart without angina pectoris 10/13/2017   DES to mid RCA   Depression    Essential (hemorrhagic) thrombocythemia (HCC) 09/06/2018   Essential hypertension 09/25/2023   Family history of early CAD 04/08/2016   GERD (gastroesophageal reflux disease)    History of hiatal hernia    Hot flashes    Hyperlipidemia    Hyperlipidemia associated with type 2 diabetes mellitus (HCC) 05/11/2017   Hypothyroidism 09/10/2015   IBS (irritable bowel syndrome)  03/29/2013   Neck pain 07/08/2015   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   takes neurotin   Peripheral vascular disease 11/03/2017   QT prolongation    Sacroiliitis 09/06/2018   Spasms of the hands or feet 10/08/2017   Syncope 06/07/2016   Tubular adenoma of colon 09/08/2003   Vertigo    Wears glasses     Past Surgical History:  Procedure Laterality Date   ABDOMINAL AORTOGRAM N/A 08/02/2017   Procedure: ABDOMINAL AORTOGRAM;  Surgeon: Darron Deatrice LABOR, MD;  Location: [facility];  Service: Cardiovascular;  Laterality: N/A;   ABDOMINAL AORTOGRAM W/LOWER EXTREMITY Bilateral 09/03/2024   Procedure: ABDOMINAL AORTOGRAM W/LOWER EXTREMITY;  Surgeon: Serene Gaile ORN, MD;  Location: [facility];  Service:  Cardiovascular;  Laterality: Bilateral;   AORTA - BILATERAL FEMORAL ARTERY BYPASS GRAFT N/A 08/14/2017   Procedure: AORTA BIFEMORAL BYPASS GRAFT;  Surgeon: Oris Krystal FALCON, MD;  Location: [facility];  Service: Vascular;  Laterality: N/A;   COLONOSCOPY     COLONOSCOPY N/A 08/29/2024   Procedure: COLONOSCOPY;  Surgeon: Wilhelmenia Aloha Raddle., MD;  Location: [facility];  Service: Gastroenterology;  Laterality: N/A;   CORONARY STENT INTERVENTION N/A 10/13/2017   Procedure: CORONARY STENT INTERVENTION;  Surgeon: Mady Bruckner, MD;  Location: [facility];  Service: Cardiovascular;  Laterality: N/A;   dental implant     ECTOPIC PREGNANCY SURGERY     EMBOLECTOMY N/A 08/14/2017   Procedure: EMBOLECTOMY FEMORAL;  Surgeon: Oris Krystal FALCON, MD;  Location: [facility] OR;  Service: Vascular;  Laterality: N/A;   FEMORAL-POPLITEAL BYPASS GRAFT Right 08/14/2017   Procedure: Right Femoral to Above Knee Popliteal Bypass Graft using Non-Reversed Greater Saphenous Vein Graft from Right Leg;  Surgeon: Oris Krystal FALCON, MD;  Location: [facility] OR;  Service: Vascular;  Laterality: Right;   FLEXIBLE SIGMOIDOSCOPY N/A 08/14/2013   Procedure: FLEXIBLE SIGMOIDOSCOPY;  Surgeon: Gwendlyn ONEIDA Buddy, MD;  Location: [facility];  Service: Endoscopy;  Laterality: N/A;   LAPAROTOMY N/A 10/24/2024   Procedure: EXPLORATORY LAPAROTOMY, ABDOMINAL PACKING AND WOUND VAC APPLICATION;  Surgeon: Debby Hila, MD;  Location: [facility];  Service: General;  Laterality: N/A;  (8) EIGHT 18X18 LAP SPONGE PACKED   LAPAROTOMY N/A 10/25/2024   Procedure: LAPAROTOMY, EXPLORATORY;  Surgeon: Rubin Calamity, MD;  Location: [facility];  Service: General;  Laterality: N/A;  REMOVAL OF PACKING   LEFT HEART CATH AND CORONARY ANGIOGRAPHY N/A 10/13/2017   Procedure: LEFT HEART CATH AND CORONARY ANGIOGRAPHY;  Surgeon: Mady Bruckner, MD;  Location: [facility];  Service: Cardiovascular;  Laterality: N/A;   LOWER EXTREMITY ANGIOGRAPHY Bilateral 08/02/2017   Procedure: Lower  Extremity Angiography;  Surgeon: Darron Deatrice LABOR, MD;  [facility]: [facility];  Service: Cardiovascular;  Laterality: Bilateral;   LOWER EXTREMITY INTERVENTION N/A 09/03/2024   Procedure: LOWER EXTREMITY INTERVENTION;  Surgeon: Serene Gaile ORN, MD;  Location: [facility];  Service: Cardiovascular;  Laterality: N/A;   MULTIPLE TOOTH EXTRACTIONS     PERIPHERAL VASCULAR INTERVENTION  05/22/2018   Procedure: PERIPHERAL VASCULAR INTERVENTION;  Surgeon: Serene Gaile ORN, MD;  Location: [facility];  Service: Cardiovascular;;  SMA and Celiac   PILONIDAL CYST EXCISION     POLYPECTOMY     THROMBECTOMY FEMORAL ARTERY Right 08/14/2017   Procedure: THROMBECTOMY FEMORAL ARTERY;  Surgeon: Oris Krystal FALCON, MD;  Location: [facility] OR;  Service: Vascular;  Laterality: Right;   TONSILLECTOMY     TOTAL HIP ARTHROPLASTY  09/11/2015   Procedure: TOTAL HIP ARTHROPLASTY;  Surgeon:  Evalene JONETTA Chancy, MD;  Location: [facility] OR;  Service: Orthopedics;;   ULTRASOUND GUIDANCE FOR VASCULAR ACCESS  10/13/2017   Procedure: Ultrasound Guidance For Vascular Access;  Surgeon: Mady Bruckner, MD;  Location: [facility];  Service: Cardiovascular;;   VENTRAL HERNIA REPAIR N/A 10/23/2024   Procedure: REPAIR, HERNIA, VENTRAL, LAPAROSCOPIC;  Surgeon: Rubin Calamity, MD;  Location: [facility];  Service: General;  Laterality: N/A;   VISCERAL ANGIOGRAPHY N/A 03/03/2020   Procedure: MESTENRIC ANGIOGRAPHY;  Surgeon: Serene Gaile ORN, MD;  Location: [facility];  Service: Cardiovascular;  Laterality: N/A;   VISCERAL ANGIOGRAPHY N/A 09/03/2024   Procedure: VISCERAL ANGIOGRAPHY;  Surgeon: Serene Gaile ORN, MD;  Location: [facility];  Service: Cardiovascular;  Laterality: N/A;   VISCERAL ARTERY INTERVENTION N/A 09/03/2024   Procedure: VISCERAL ARTERY INTERVENTION;  Surgeon: Serene Gaile ORN, MD;  Location: [facility];  Service: Cardiovascular;  Laterality: N/A;    Family History: Family History  Problem  Relation Age of Onset   Dementia Mother    Arthritis Mother    Hyperlipidemia Mother    Heart disease Mother    Hypertension Mother    Stroke Mother 35   Irritable bowel syndrome Mother    Thyroid  disease Mother    Heart attack Mother    Heart disease Father    Hyperlipidemia Father    Hypertension Father    Stroke Father 67   Thyroid  disease Father    Prostate cancer Father    Heart attack Father    Lung cancer Brother 29   Heart attack Maternal Uncle    Heart attack Maternal Grandfather    Colon cancer Neg Hx    Rectal cancer Neg Hx    Stomach cancer Neg Hx    Esophageal cancer Neg Hx     Social History  reports that she quit smoking about 10 years ago. Her smoking use included cigarettes. She has never used smokeless tobacco. She reports that she does not drink alcohol and does not use drugs.  Allergies  Allergen Reactions   Latex Itching    Must use paper tape   Amitiza  [Lubiprostone ] Diarrhea    Uncontrollable diarrhea   Nortriptyline  Other (See Comments)    Stomach distention   Sulfa Antibiotics Nausea And Vomiting and Other (See Comments)    GI distress/pain   Tramadol  Itching and Rash   Atorvastatin  Nausea And Vomiting   Claritin [Loratadine] Other (See Comments)    Hot flashes    Medications   Current Facility-Administered Medications:    [START ON 10/29/2024]  stroke: early stages of recovery book, , Does not apply, Once, Voncile Isles, MD   acetaminophen  (TYLENOL ) tablet 1,000 mg, 1,000 mg, Per Tube, Q6H PRN, Rubin Calamity, MD, 1,000 mg at 10/28/24 1247   amLODipine  (NORVASC ) tablet 5 mg, 5 mg, Per Tube, Daily, Olalere, Adewale A, MD, 5 mg at 10/28/24 1041   aspirin  chewable tablet 81 mg, 81 mg, Per Tube, Daily, Ilah Krabbe M, PA-C, 81 mg at 10/28/24 1247   carvedilol  (COREG ) tablet 12.5 mg, 12.5 mg, Per Tube, BID WC, Olalere, Adewale A, MD, 12.5 mg at 10/28/24 9187   cefTRIAXone  (ROCEPHIN ) 2 g in sodium chloride  0.9 % 100 mL IVPB, 2 g,  Intravenous, q1800, Olalere, Adewale A, MD, Stopped at 10/27/24 1802   Chlorhexidine  Gluconate Cloth 2 % PADS 6 each, 6 each, Topical, Daily, Rubin Calamity, MD, 6 each at 10/27/24 1231   famotidine  (PEPCID ) tablet 20 mg, 20 mg,  Per Tube, Daily, Britta Eva HERO, RPH, 20 mg at 10/28/24 1041   feeding supplement (VITAL AF 1.2 CAL) liquid 1,000 mL, 1,000 mL, Per Tube, Q24H, Rubin Calamity, MD   feeding supplement (VITAL HIGH PROTEIN) liquid 1,000 mL, 1,000 mL, Per Tube, Continuous, Rubin Calamity, MD   fentaNYL  (SUBLIMAZE ) bolus via infusion 25-100 mcg, 25-100 mcg, Intravenous, Q15 min PRN, Rubin Calamity, MD, 100 mcg at 10/26/24 0139   fentaNYL  (SUBLIMAZE ) injection 25 mcg, 25 mcg, Intravenous, Q2H PRN, Olalere, Adewale A, MD, 25 mcg at 10/27/24 0144   fentaNYL  (SUBLIMAZE ) injection 25-50 mcg, 25-50 mcg, Intravenous, Once, Rubin Calamity, MD   hydrALAZINE  (APRESOLINE ) injection 10 mg, 10 mg, Intravenous, Q4H PRN, Paliwal, Aditya, MD   Influenza vac split trivalent PF (FLUZONE HIGH-DOSE) injection 0.5 mL, 0.5 mL, Intramuscular, Tomorrow-1000, Rubin Calamity, MD   labetalol  (NORMODYNE ) injection 20 mg, 20 mg, Intravenous, Q2H PRN, Paliwal, Aditya, MD   levothyroxine  (SYNTHROID ) tablet 75 mcg, 75 mcg, Per Tube, Q0600, Ramirez, Armando, MD, 75 mcg at 10/28/24 0558   methocarbamol  (ROBAXIN ) tablet 500 mg, 500 mg, Per Tube, Q6H PRN, Nicholaus Quarry, RPH   ondansetron  (ZOFRAN -ODT) disintegrating tablet 4 mg, 4 mg, Oral, Q6H PRN **OR** ondansetron  (ZOFRAN ) injection 4 mg, 4 mg, Intravenous, Q6H PRN, Rubin Calamity, MD, 4 mg at 11-06-2024 2023   Oral care mouth rinse, 15 mL, Mouth Rinse, Q2H, Rubin Calamity, MD, 15 mL at 10/28/24 1248   Oral care mouth rinse, 15 mL, Mouth Rinse, PRN, Rubin Calamity, MD   oxyCODONE  (Oxy IR/ROXICODONE ) immediate release tablet 5-10 mg, 5-10 mg, Per Tube, Q4H PRN, Nicholaus Quarry, RPH, 10 mg at 10/28/24 1247  Vitals   Vitals:   10/28/24 0826 10/28/24 0900 10/28/24  1000 10/28/24 1144  BP:  (!) 147/44 (!) 173/52   Pulse:  63 65   Resp:  10 17   Temp:  100.2 F (37.9 C) 100.2 F (37.9 C)   TempSrc:      SpO2: 96% 96% 97% 96%  Weight:   53.9 kg   Height:        Body mass index is 21.05 kg/m.   Physical Exam  General: Intubated, no sedation HEENT: Normocephalic atraumatic Lungs: Vented Cardiovascular: Regular rate rhythm Neurological exam Intubated, no sedation No spontaneous movement Does not open eyes to voice Nonverbal No grimace to noxious stimulation Cranial nerves: Pupils are equal round reactive light, gaze midline, corneal reflexes are present, breathing above the ventilator, oculocephalic reflexes are present.  Does not blink to threat from either side.  Facial symmetry difficult to ascertain due to endotracheal tube. Motor examination with minimal flicker of movement with noxious stimulation but no purposeful movement or withdrawal Sensory exam: As above  Labs/Imaging/Neurodiagnostic studies   CBC:  Recent Labs  Lab 11/06/24 1636 2024/11/06 2233 10/26/24 0414 10/26/24 1200 10/27/24 0506 10/27/24 1600 10/28/24 0450  WBC 11.7*   < > 6.4  --  6.5  --  6.6  NEUTROABS 10.1*  --  5.4  --   --   --   --   HGB 8.1*   < > 9.1*   < > 8.8*  8.9* 8.8* 8.0*  HCT 25.2*   < > 26.5*   < > 26.2*  26.2* 26.9* 24.4*  MCV 89.0   < > 90.1  --  92.9  --  93.5  PLT 117*   < > 52*  --  71*  --  82*   < > = values in this interval not displayed.  Basic Metabolic Panel:  Lab Results  Component Value Date   NA 144 10/28/2024   K 3.3 (L) 10/28/2024   CO2 25 10/28/2024   GLUCOSE 119 (H) 10/28/2024   BUN 37 (H) 10/28/2024   CREATININE 2.65 (H) 10/28/2024   CALCIUM  7.7 (L) 10/28/2024   GFRNONAA 19 (L) 10/28/2024   GFRAA >60 04/08/2020   Lipid Panel:  Lab Results  Component Value Date   LDLCALC 68 09/29/2023   HgbA1c:  Lab Results  Component Value Date   HGBA1C 4.9 10/18/2024   Alcohol Level     Component Value Date/Time    ETH <5 06/06/2016 1921   INR  Lab Results  Component Value Date   INR 1.3 (H) 10/25/2024   APTT  Lab Results  Component Value Date   APTT 36 10/25/2024   Personally reviewed imaging CT head-in comparison to 05/03/2024, shows interval development of multiple cortical and subcortical hypodensities in bilateral cerebellar and cerebral hemispheres suggestive of acute/subacute infarcts.  ASSESSMENT   Erin [name] is a 68 y.o. female initially admitted for abdominal wall hernia repair complicated by bleeding requiring exploratory laparotomy, now postop day 2 from reexploration and abdominal closure, who developed hemorrhagic shock, AKI on CKD and noted to have renal infarct, whose mental status did not improve prompting head imaging which reveals bilateral cerebral and cerebellar infarcts. Going by the clinical course-which had hypotension, acute blood loss anemia, hemorrhagic shock, possible DIC-strokes are likely related to hypoperfusion versus hypercoagulability from DIC, and bihemispheric involvement with strokes is likely leading to her poor wakefulness.  Impression: Acute ischemic infarcts-secondary to cerebral hypoperfusion from hemorrhagic shock versus DIC   RECOMMENDATIONS  Continue telemetry Frequent neurochecks MRI brain without contrast (ideally would like a contrast-enhanced study but renal function does not permit that) Carotid Dopplers Ideally CTA head and neck would have been great but her renal function does not permit contrast administration. 2D echocardiogram Check A1c and lipid panel If okay with primary team and heme-onc, aspirin  81. I would recommend getting hematology to please review the patient chart to ensure antiplatelet use is okay. I discussed antiplatelet with surgery attending Dr. Rubin who is okay from a surgical standpoint to use aspirin . From a stroke perspective as far as being a candidate for any kind of intervention, she is outside the window  since her last known well is multiple days ago. Ventilator management per PCCM Postoperative care per general surgery as you are  Patients with stroke diagnoses are usually cared for at [facility] in our system, but she is outside the window for any acute intervention and given the critical nature of her illness, I have recommended that she stay at [facility] and we will be available to follow her as more testing becomes available.  I discussed the above treatment plan with the husband at bedside.  At this point, it is premature to discuss what recovery would look like.  Further testing results such as the MRI etc. would help get a better picture of that.  I discussed this in detail with the husband.  I have discussed my plan with Dr. Rubin from the surgical team and Corean Reese and Dr Catherine from PCCM  ______________________________________________________________________    Signed, Eligio Lav, MD [facility]   CRITICAL CARE ATTESTATION Performed by: Eligio Lav, MD Total critical care time: 60 minutes Critical care time was exclusive of separately billable procedures and treating other patients and/or supervising APPs/Residents/Students Critical care was necessary to treat or  prevent imminent or life-threatening deterioration. This patient is critically ill and at significant risk for neurological worsening and/or death and care requires constant monitoring. Critical care was time spent personally by me on the following activities: development of treatment plan with patient and/or surrogate as well as nursing, discussions with consultants, evaluation of patient's response to treatment, examination of patient, obtaining history from patient or surrogate, ordering and performing treatments and interventions, ordering and review of laboratory studies, ordering and review of radiographic studies, pulse oximetry, re-evaluation of patient's condition,  participation in multidisciplinary rounds and medical decision making of high complexity in the care of this patient.

## 2024-10-28 NOTE — Progress Notes (Signed)
 3 Days Post-Op   Subjective/Chief Complaint: Pt with no acute events Some agitation per RN   Objective: Vital signs in last 24 hours: Temp:  [97.7 F (36.5 C)-100.2 F (37.9 C)] 100.2 F (37.9 C) (12/08 0600) Pulse Rate:  [57-74] 64 (12/08 0600) Resp:  [11-23] 14 (12/08 0600) BP: (132-190)/(39-76) 142/39 (12/08 0600) SpO2:  [94 %-100 %] 96 % (12/08 0600) Arterial Line BP: (100)/(96) 100/96 (12/07 0800) FiO2 (%):  [30 %] 30 % (12/08 0415) Last BM Date :  (PTA)  Intake/Output from previous day: 12/07 0701 - 12/08 0700 In: 2711.1 [I.V.:1852; NG/GT:761; IV Piggyback:98.1] Out: 1190 [Urine:1080; Emesis/NG output:110] Intake/Output this shift: Total I/O In: 1511.7 [I.V.:1091.7; NG/GT:420] Out: 435 [Urine:435]  General appearance: sleeping, no sedation Cardio: regular rate and rhythm, S1, S2 normal, no murmur, click, rub or gallop GI: soft, non-tender; bowel sounds normal; no masses,  no organomegaly and inc cdi  Lab Results:  Recent Labs    10/27/24 0506 10/27/24 1600 10/28/24 0450  WBC 6.5  --  6.6  HGB 8.8*  8.9* 8.8* 8.0*  HCT 26.2*  26.2* 26.9* 24.4*  PLT 71*  --  82*   BMET Recent Labs    10/27/24 0506 10/28/24 0450  NA 143 144  K 2.9* 3.3*  CL 103 108  CO2 27 25  GLUCOSE 88 119*  BUN 36* 37*  CREATININE 2.85* 2.65*  CALCIUM  7.7* 7.7*   PT/INR No results for input(s): LABPROT, INR in the last 72 hours. ABG Recent Labs    10/25/24 1100 10/27/24 1015  PHART  --  7.39  HCO3 31.5* 26.0    Studies/Results: DG Abd 1 View Result Date: 10/26/2024 EXAM: 1 VIEW XRAY OF THE ABDOMEN 10/26/2024 10:18:00 PM COMPARISON: 10/24/2024 CLINICAL HISTORY: [id number] Encounter for imaging study to confirm nasogastric (NG) tube placement [id number] Encounter for imaging study to confirm nasogastric (NG) tube placement FINDINGS: LINES, TUBES AND DEVICES: NG tube coils in the fundus of the stomach. BOWEL: Nonobstructive bowel gas pattern. SOFT TISSUES: Skin staples noted to  the right of midline. No abnormal calcifications. BONES: No acute fracture. IMPRESSION: 1. NG tube tip projects over the fundus of the stomach. 2. Nonobstructive bowel gas pattern. Electronically signed by: Franky Crease MD 10/26/2024 10:23 PM EST RP Workstation: [id number]   DG Chest Port 1 View Result Date: 10/26/2024 EXAM: 1 VIEW(S) XRAY OF THE CHEST 10/26/2024 08:29:42 PM COMPARISON: 10/25/2024 CLINICAL HISTORY: Respiratory failure (HCC) FINDINGS: LINES, TUBES AND DEVICES: Endotracheal tube in place with tip 1 cm above the carina. Enteric tube coiled within the gastric fundus. Stable left IJ approach central venous catheter with tip at the superior cavoatrial junction. LUNGS AND PLEURA: Small right pleural effusion with right basilar opacities, not significantly changed. No pneumothorax. HEART AND MEDIASTINUM: Atherosclerotic plaque. No acute abnormality of the cardiac and mediastinal silhouettes. BONES AND SOFT TISSUES: Upper abdominal surgical staples noted. No acute osseous abnormality. IMPRESSION: 1. Small right pleural effusion with persistent right basilar opacities, unchanged. 2. Enteric tube coiled within the stomach. Consider retracting by 3cm. Electronically signed by: Morgane Naveau MD 10/26/2024 08:34 PM EST RP Workstation: [id number]    Anti-infectives: Anti-infectives (From admission, onward)    Start     Dose/Rate Route Frequency Ordered Stop   10/26/24 1800  cefTRIAXone  (ROCEPHIN ) 2 g in sodium chloride  0.9 % 100 mL IVPB        2 g 200 mL/hr over 30 Minutes Intravenous Daily-1800 10/26/24 1739     10/23/24 0700  ceFAZolin  (ANCEF ) IVPB  2g/100 mL premix        2 g 200 mL/hr over 30 Minutes Intravenous On call to O.R. 10/23/24 9351 10/23/24 9166       Assessment/Plan: s/p Procedure(s) with comments: LAPAROTOMY, EXPLORATORY (N/A) - REMOVAL OF PACKING  POD 4 from lap VHR and reexploration for bleeding, POD 2 from reexploration and abdominal closure -AKI appears to be improving  some, appreciate Nephro assistance -hypokalemia will replace -Appreciate CCM assistance.  Did some weaning from vent last night. On rate overnight -OK to increase TF today -Hgb has been fairly stable.  Will monitor on daily basis.   LOS: 5 days    Lynda Leos 10/28/2024

## 2024-10-28 NOTE — Progress Notes (Signed)
 Nutrition Follow-up  DOCUMENTATION CODES:   Non-severe (moderate) malnutrition in context of chronic illness  INTERVENTION:  - Per Surgery, advancing tube feed rate to 8mL/hr today.  Increase Vital HP to 81mL/hr this morning and run until 19:59 tonight. At 2000, switch formula to Vital 1.2, continuing rate of 15mL/hr  - Monitor magnesium , potassium, and phosphorus daily for at least 3 days, MD to replete as needed, as pt is at risk for refeeding syndrome.  - If patient tolerating rate of 57mL/hr tomorrow morning, would recommend advancing to goal TF regimen: Vital 1.2 at 50 ml/h (1200 ml per day) Provides 1440 kcal, 90 gm protein, 973 ml free water  daily  - Monitor weight trends.   - Added daily weights.   NUTRITION DIAGNOSIS:   Moderate Malnutrition related to chronic illness as evidenced by moderate muscle depletion, percent weight loss (15% in 6 months). *ongoing  GOAL:   Patient will meet greater than or equal to 90% of their needs *progressing, on TF  MONITOR:   Vent status, Weight trends  REASON FOR ASSESSMENT:   Consult Enteral/tube feeding initiation and management (Trickle TF)  ASSESSMENT:   68 y.o. female with PMH GERD. IBS, CAD, HTN who presented for elective hernia repair.  12/3 s/p hernia repair; became hypotensive after surgery requiring vasopressors, transferred to the ICU 12/4 returned to OR for ex-lap, abdominal packing and wound vac application; NGT placed; returned to ICU intubated 12/5 s/p ex-lap, removal of packing, explantation of mesh, closure of abdomen 12/7 Trickle TF initiated  Patient is currently intubated on ventilator support MV: 6 L/min Temp (24hrs), Avg:99.6 F (37.6 C), Min:97.7 F (36.5 C), Max:100.2 F (37.9 C)  Patient had recently returned from CT scan at time of visit.  RN hooking back up tube feeds. Trickle tube feeds were started yesterday and patient appears to be tolerating with not noted issues.   Per Surgery,  increasing tube feeds to 4mL/hr today. Discussed with Surgery and CCM. Plan to continue Vital HP @ 76mL/hr until this evening. Will then switch to goal formula of Vital 1.2 @ 37mL/hr. If tolerating, plan to increase to goal of 34mL/hr tomorrow morning. Discussed plan with RN as well.  Husband at bedside this morning. Reports patient's UBW to be 106# recently and that the patient had been trying to gain weight but had not really been successful. Eating well up until admission. He reports they don't have any set number of meals they typically eat at home, just eat whenever they get hungry. He notes the patient has tried ONS before and hated them. However, he feels she may be more willing to try Boost Breeze for a juice-like supplement once able to be liberated from the ventilator.   Admit weight: 97# Current weight: 118# -> likely fluid related I&O's: +10L since admit + for mid-pitting generalized edema, moderate pitting RUE/LUE edema, and non-pitting RLE/LLE edema  Medications reviewed and include: -   Labs reviewed: K+ 3.3  Creatinine 2.65    Diet Order:   Diet Order             Diet NPO time specified  Diet effective now           Diet - low sodium heart healthy                   EDUCATION NEEDS:  Not appropriate for education at this time  Skin:  Skin Assessment: Skin Integrity Issues: Skin Integrity Issues:: Incisions Incisions: Surgical Abdomen  Last BM:  PTA  Height:  Ht Readings from Last 1 Encounters:  10/25/24 5' 3 (1.6 m)   Weight:  Wt Readings from Last 1 Encounters:  10/23/24 44.4 kg   Ideal Body Weight:  52.27 kg  BMI:  Body mass index is 17.34 kg/m.  Estimated Nutritional Needs:  Kcal:  1400-1550 kcals Protein:  75-90 grams Fluid:  >/= 1.5L    Trude Ned RD, LDN Contact via Secure Chat.

## 2024-10-28 NOTE — Progress Notes (Signed)
 EEG complete - results pending

## 2024-10-28 NOTE — Plan of Care (Signed)
  Problem: Education: Goal: Knowledge of General Education information will improve Description: Including pain rating scale, medication(s)/side effects and non-pharmacologic comfort measures Outcome: Progressing   Problem: Health Behavior/Discharge Planning: Goal: Ability to manage health-related needs will improve Outcome: Progressing   Problem: Clinical Measurements: Goal: Ability to maintain clinical measurements within normal limits will improve Outcome: Progressing Goal: Will remain free from infection Outcome: Progressing Goal: Diagnostic test results will improve Outcome: Progressing Goal: Respiratory complications will improve Outcome: Progressing Goal: Cardiovascular complication will be avoided Outcome: Progressing   Problem: Activity: Goal: Risk for activity intolerance will decrease Outcome: Progressing   Problem: Nutrition: Goal: Adequate nutrition will be maintained Outcome: Progressing   Problem: Coping: Goal: Level of anxiety will decrease Outcome: Progressing   Problem: Elimination: Goal: Will not experience complications related to bowel motility Outcome: Progressing Goal: Will not experience complications related to urinary retention Outcome: Progressing   Problem: Pain Managment: Goal: General experience of comfort will improve and/or be controlled Outcome: Progressing   Problem: Safety: Goal: Ability to remain free from injury will improve Outcome: Progressing   Problem: Skin Integrity: Goal: Risk for impaired skin integrity will decrease Outcome: Progressing   Problem: Activity: Goal: Ability to tolerate increased activity will improve Outcome: Progressing   Problem: Respiratory: Goal: Ability to maintain a clear airway and adequate ventilation will improve Outcome: Progressing   Cindy S. Loreli BSN, RN, CCRP, CCRN 10/28/2024 1:14 AM

## 2024-10-28 NOTE — Procedures (Signed)
 Patient Name: Erin [name]  MRN: [id number]  Epilepsy Attending: Arlin MALVA Krebs  Referring Physician/Provider: Ilah Corean [name], PA-C  Date: 10/28/2024 Duration: 24.28 mins  Patient history: 68yo F with ams. EEG to evaluate for seizure  Level of alertness: comatose/lethargic  AEDs during EEG study: None  Technical aspects: This EEG study was done with scalp electrodes positioned according to the 10-20 International system of electrode placement. Electrical activity was reviewed with band pass filter of 1-70Hz , sensitivity of 7 uV/mm, display speed of 31mm/sec with a 60Hz  notched filter applied as appropriate. EEG data were recorded continuously and digitally stored.  Video monitoring was available and reviewed as appropriate.  Description: EEG showed continuous generalized polymorphic high amplitude 3 to 5 Hz theta-delta slowing. Hyperventilation and photic stimulation were not performed.     ABNORMALITY - Continuous slow, generalized  IMPRESSION: This study is suggestive of generalized cerebral dysfunction ( encephalopathy). No seizures or epileptiform discharges were seen throughout the recording.  Zamauri Nez O Yaiden Yang

## 2024-10-28 NOTE — Progress Notes (Signed)
 SLP Cancellation Note  Patient Details Name: Erin [name] MRN: [id number] DOB: [birth year]   Cancelled treatment:       Reason Eval/Treat Not Completed: Patient not medically ready Patient unresponsive on vent. SLP will follow for readiness.  Norleen IVAR Blase, MA, CCC-SLP Speech Therapy  10/28/2024, 1:14 PM

## 2024-10-28 NOTE — Progress Notes (Signed)
 eLink Physician-Brief Progress Note Patient Name: Erin [name] DOB: [birth year] MRN: [id number]   Date of Service  10/28/2024  HPI/Events of Note  BP elevated (SBP 180s), with significant dependent edema.  I/Os indicate pt is net 10L positive.   eICU Interventions  Placed one time order for lasix  40mg  IV.  Will continue to monitor closely.         Lovina Zuver M DELA CRUZ 10/28/2024, 11:21 PM

## 2024-10-28 NOTE — Progress Notes (Signed)
  Echocardiogram 2D Echocardiogram has been performed.  Erin [name] ORN [facility] 10/28/2024, 3:29 PM

## 2024-10-28 NOTE — Progress Notes (Signed)
 RT assisted with transporting pt on vent to and back from CT without any complications.

## 2024-10-28 NOTE — Progress Notes (Signed)
 Assisted with transporting pt to and from MRI on vent and placed pt on MRI vent, no complications noted.

## 2024-10-28 NOTE — Progress Notes (Signed)
 NAME:  Erin [name], MRN:  [id number], DOB:  1956-05-22, LOS: 5 ADMISSION DATE:  10/23/2024, CONSULTATION DATE:  10/23/2024 REFERRING MD: Rubin - CCS, CHIEF COMPLAINT:  Hypotension   History of Present Illness:  68 year old woman who presented to [facility] 12/3 for elective hernia repair. 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  Postoperatively, patient remained hypotensive on pressors. Pt is laying in bed calling out in pain of legs and abdomen. Feels that she needs to keep her legs bent. Appears very pale in skin and mucosal membranes. Pt is oriented but has requested all questions are directed to her husband at this time. Pt does endorse dizziness, despite being supine and feeling weak and cold. Otherwise ROS negative or pt is not able to verbalize other symptoms.    Husband on the phone states that leg pain is chronic and typically has been 2/2 low mag and potassium for which she is reportedly on supplementation for. Husband also states that she recently (10/14) had mesenteric artery stenting and he is concerned that this is contributing to her presentation post operatively from the hernia repair. I have discussed with him plan for cvc, repeat labs and likely cta to eval for active bleed. He agrees with plan and consents to further blood product transfusion.     PCCM was asked to consult for hypotension.  Pertinent Medical History:  Kidney stent for stenosis Abdominal wall hernia present for about 2 years Bilateral femoral bypass surgery in 2014 History of mesenteric stent 09/09/2024  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/14 60.4Gy/59fx and  chemo   Anxiety    Aortoiliac occlusive disease (HCC) 08/14/2017   Arthritis    B12 deficiency anemia 09/14/2015   Blood transfusion without reported diagnosis    BPPV (benign paroxysmal positional vertigo) 07/08/2015   Cardiomyopathy (HCC) 11/03/2017   Chronic back pain    Chronic daily headache 03/29/2013   takes bc powder   Closed right hip fracture (HCC) 09/10/2015   Coronary artery disease involving native coronary artery of native heart without angina pectoris 10/13/2017   DES to mid RCA   Depression    Essential (hemorrhagic) thrombocythemia (HCC) 09/06/2018   Essential hypertension 09/25/2023   Family history of early CAD 04/08/2016   GERD (gastroesophageal reflux disease)    History of hiatal hernia    Hot flashes    Hyperlipidemia    Hyperlipidemia associated with type 2 diabetes mellitus (HCC) 05/11/2017   Hypothyroidism 09/10/2015   IBS (irritable bowel syndrome) 03/29/2013   Neck pain 07/08/2015   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   takes neurotin   Peripheral vascular disease 11/03/2017   QT prolongation    Sacroiliitis 09/06/2018   Spasms of the hands or feet 10/08/2017   Syncope 06/07/2016   Tubular adenoma of colon 09/08/2003   Vertigo    Wears glasses    Significant Hospital Events: Including procedures, antibiotic start and stop dates in addition to other pertinent events   12/3 admitted to ICU postop 12/3-post hernia repair 12/4 taken back to the OR for hemoperitoneum-active bleed was not identified, abdomen packed with wound VAC in place 12/5 back to the OR for an open abdomen-no evidence of ongoing  bleeding and abdomen was closed  Interim History/Subjective:  Remains encephalopathic/somnolent Weaned on vent, pulling good volumes CT Head obtained demonstrating several new strokes, c/f embolic in origin Neuro consulted Husband updated at bedside  Objective:    Blood pressure (!) 142/39, pulse 64, temperature 100.2 F (37.9 C), resp. rate 14, height 5'  3 (1.6 m), weight 44.4 kg, SpO2 96%.    Vent Mode: PRVC FiO2 (%):  [30 %] 30 % Set Rate:  [14 bmp] 14 bmp Vt Set:  [420 mL] 420 mL PEEP:  [5 cmH20] 5 cmH20 Pressure Support:  [8 cmH20] 8 cmH20 Plateau Pressure:  [10 cmH20-15 cmH20] 12 cmH20   Intake/Output Summary (Last 24 hours) at 10/28/2024 0748 Last data filed at 10/28/2024 9388 Gross per 24 hour  Intake 2711.08 ml  Output 1190 ml  Net 1521.08 ml   Filed Weights   10/23/24 0658 10/23/24 0709  Weight: 44.4 kg 44.4 kg   Physical Examination: General: Acutely ill-appearing older woman in NAD. HEENT: [place]/AT, anicteric sclera, PERRL 4mm, moist mucous membranes. Neuro: Intubated, somnolent, will open eyes to noxious stimuli. Does not withdraw to pain, but opens eyes/slight grimacing. Not following commands. No spontaneous movement of extremities.+Corneal, +Cough, and +Gag  CV: RRR, no m/g/r. PULM: Breathing even and unlabored on vent (PEEP 5, FiO2 40%). Lung fields CTAB. GI: Soft, appropriately TTP postoperatively, mildly distended. Midline incision c/d/i without erythema/drainage. Honeycomb dressing in place. Hypoactive bowel sounds. Extremities: Bilateral 1-2+ LE edema noted, nonpitting. Bilateral 1+ dependent edema of hands.  Skin: Warm/dry, no rashes.  Resolved Problem List:    Assessment and Plan:   Acute bilateral cerebral and cerebellar infarcts CT Head with interval development (since 04/2024) of multiple cortical and subcortical hypodensities in bilateral cerebellar and cerebral hemispheres suggestive of acute/subacute infarcts. Concern for embolic strokes 2/2 hypotension in the setting of hemorrhagic shock versus DIC, hypoperfusion vs. hypercoagulability. - Neurology consulted, appreciate recommendations - Unfortunately out of window for any interventions at this juncture - F/u MRI Brain without contrast - F/u spot EEG - ASA 81mg  daily for antiplatelet agent, OK per CCS - F/u Echo, lipid panel, A1c - Frequent neuro  checks - Neuroprotective measures: HOB > 30 degrees, normoglycemia, normothermia, electrolytes WNL - PT/OT/SLP when able to participate in care - Low threshold for transfer to [facility] for further stroke care if necessary, pending further testing  Hemorrhagic shock, resolved Concern for DIC Thrombocytopenia History to HTN Received blood products (4U PRBCs, 1U FFP, 1U Cryo). - Goal MAP > 65 - Volume resuscitation as tolerated - Off of vasopressors at present - Hematology consult, evaluate for ongoing thrombocytopenia - Holding home antihypertensives for now  S/p hernia repair, elective 12/3 S/p ex-lap 12/4, 12/5 - Postoperative management per CCS - Multimodal pain management - Limit sedating medications as able  Acute kidney injury - Trend BMP - Replete electrolytes as indicated - Monitor I&Os - Avoid nephrotoxic agents as able - Ensure adequate renal perfusion  Respiratory insufficiency Increased tracheal secretions Cultures requested-abundant gram-negative rods, few gram-positive cocci. - Continue full vent support (4-8cc/kg IBW) - Wean FiO2 for O2 sat > 90% - Daily WUA/SBT, extubation currently precluded by mental status - VAP bundle - Pulmonary hygiene - PAD protocol for sedation: Off of sedation at present, oxycodone  PRN  At risk for malnutrition - Appreciate RD assistance - TF per CCS, advance as tolerated  Critical care time:   The patient is critically ill with multiple organ system failure and requires high complexity decision making for assessment  and support, frequent evaluation and titration of therapies, advanced monitoring, review of radiographic studies and interpretation of complex data.   Critical Care Time devoted to patient care services, exclusive of separately billable procedures, described in this note is 37 minutes.  Corean [name] Elaura Calix, PA-C [facility] 10/28/24 4:39 PM  Please see [email or web address] for pager details.  From 7A-7P if  no response, please call [phone] After hours, please call ELink [phone]

## 2024-10-28 NOTE — Progress Notes (Signed)
 [facility] Progress Note  Subjective:  UOP good Creatinine down to 2.6  Vitals:   10/28/24 1300 10/28/24 1400 10/28/24 1500 10/28/24 1611  BP:  (!) 109/43 (!) 114/42   Pulse: 60 65    Resp: 16 14 15    Temp:      TempSrc:      SpO2: 94% 97%  97%  Weight:      Height:        Exam: Gen: thin woman intubated on vent  ENT: ETT in place Neck: L internal jugular triple lumen central line CV: RRR Abd:  abd binder in place Lungs: coarse ant on vent GU: foley draining normal yellow urine Extr: no edema Neuro: sedated but arousable per RN  H/o vascular surgeries/ procedures: 2018: Dr Early: aorta-bilat femoral artery bypass graft, embolectomy 2018: other local vascular procedures on L and R  2019: Dr Serene: angioplasty/ stenting of SMA and celiac arteries 2021: repeat angio and angioplasty of SMA and celiac arteries 2023: abd pain -> duplex showed widely patent mesenteric stents w/o any evidence of recurrent stenosis (SMA, celiac) 09/03/24: 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, 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: underwent drug-coated balloon angioplasty to the celiac and SMA artery under conscious sedation (57 min); bialt fem-pop bypass grafts were patent  Date   Creat  eGFR (ml/min) 2014- nov 2024 0.63- 0.90 67- 95 ml/min Feb 2025  1.88 >> 1.12 AKI, 29- 54 ml/min March 2025  1.06  54 ml/min   July 2025  1.38  42 ml/min   Aug 2025  1.49  38 ml/min Oct 2025  1.70      Oct 18, 2024  1.47  38 ml/min 12/03   1.81  30 ml/min  12/04   1.93  28  12/05   2.75  18 12/06   2.88  17 12/07   2.85  17 10/28/24  2.65  19 ml/min    Assessment/ Plan: AKI on CKD 3b: b/l creatinine 1.4- 1.5 from July- nov 2025, eGFR 38- 42 ml/min. Creat here was 1.8 on admission which rose to 2.8 12/06 and 12/07 and down to 2.6 today.  UOP is good. Pt admitted for hernia repair (12/03) which was complicated by DIC and intra-abd bleeding  requiring emergent ex-lap 12/04 and removal of packing 12/05. Pt was in shock for about 24 hrs on pressor support 12/03- 12/04. AKI is likely due to hypotension / hypoperfusion related to shock from ABLA. Seems to be doing well w/ declining creatinine and good UOP today. Will follow, have d/w family.  S/P hernia repair Intra-abdominal bleeding: post-op, required ex-lap w/ packing. Also DIC was suggested.  Shock: due to ABLA, was on pressors for < 24 hrs it appears.  H/o rectal cancer H/o vascular disease: prior aorta-bifem surgery, and also hx of stenting of the celiac/ SMA arteries in the past.      Myer Fret MD  [facility] 10/28/2024, 6:16 PM  Recent Labs  Lab 10/27/24 0506 10/27/24 1600 10/28/24 0450 10/28/24 1642  HGB 8.8*  8.9* 8.8* 8.0*  --   ALBUMIN  2.2*  --  2.1*  --   CALCIUM  7.7*  --  7.7*  --   PHOS 5.5*  --  3.6 3.1  CREATININE 2.85*  --  2.65*  --   K 2.9*  --  3.3*  --    No results for input(s): IRON, TIBC, FERRITIN in the last 168 hours. Inpatient medications:  [  START ON 10/29/2024]  stroke: early stages of recovery book   Does not apply Once   amLODipine   5 mg Per Tube Daily   aspirin   81 mg Per Tube Daily   carvedilol   12.5 mg Per Tube BID WC   Chlorhexidine  Gluconate Cloth  6 each Topical Daily   famotidine   20 mg Per Tube Daily   feeding supplement (VITAL AF 1.2 CAL)  1,000 mL Per Tube Q24H   heparin  injection (subcutaneous)  5,000 Units Subcutaneous Q8H   Influenza vac split trivalent PF  0.5 mL Intramuscular Tomorrow-1000   levothyroxine   75 mcg Per Tube Q0600   mouth rinse  15 mL Mouth Rinse Q2H   pantoprazole  (PROTONIX ) IV  40 mg Intravenous QHS    ceFEPime  (MAXIPIME ) IV Stopped (10/28/24 1647)   feeding supplement (VITAL HIGH PROTEIN) 40 mL/hr at 10/28/24 1521   acetaminophen , fentaNYL , fentaNYL  (SUBLIMAZE ) injection, hydrALAZINE , labetalol , methocarbamol , ondansetron  **OR** ondansetron  (ZOFRAN ) IV, mouth rinse, oxyCODONE 

## 2024-10-29 ENCOUNTER — Inpatient Hospital Stay (HOSPITAL_COMMUNITY)

## 2024-10-29 ENCOUNTER — Other Ambulatory Visit: Payer: Self-pay | Admitting: Surgery

## 2024-10-29 ENCOUNTER — Other Ambulatory Visit: Payer: Self-pay

## 2024-10-29 DIAGNOSIS — I70213 Atherosclerosis of native arteries of extremities with intermittent claudication, bilateral legs: Secondary | ICD-10-CM

## 2024-10-29 DIAGNOSIS — K551 Chronic vascular disorders of intestine: Secondary | ICD-10-CM

## 2024-10-29 LAB — RENAL FUNCTION PANEL
Albumin: 2.2 g/dL — ABNORMAL LOW (ref 3.5–5.0)
Anion gap: 8 (ref 5–15)
BUN: 41 mg/dL — ABNORMAL HIGH (ref 8–23)
CO2: 28 mmol/L (ref 22–32)
Calcium: 8 mg/dL — ABNORMAL LOW (ref 8.9–10.3)
Chloride: 107 mmol/L (ref 98–111)
Creatinine, Ser: 2.43 mg/dL — ABNORMAL HIGH (ref 0.44–1.00)
GFR, Estimated: 21 mL/min — ABNORMAL LOW (ref >60)
Glucose, Bld: 137 mg/dL — ABNORMAL HIGH (ref 70–99)
Phosphorus: 2.4 mg/dL — ABNORMAL LOW (ref 2.5–4.6)
Potassium: 3.3 mmol/L — ABNORMAL LOW (ref 3.5–5.1)
Sodium: 142 mmol/L (ref 135–145)

## 2024-10-29 LAB — URINALYSIS, ROUTINE W REFLEX MICROSCOPIC
Bilirubin Urine: NEGATIVE
Glucose, UA: 50 mg/dL — AB
Ketones, ur: NEGATIVE mg/dL
Leukocytes,Ua: NEGATIVE
Nitrite: NEGATIVE
Protein, ur: 100 mg/dL — AB
Specific Gravity, Urine: 1.009 (ref 1.005–1.030)
pH: 8 (ref 5.0–8.0)

## 2024-10-29 LAB — GLUCOSE, CAPILLARY
Glucose-Capillary: 119 mg/dL — ABNORMAL HIGH (ref 70–99)
Glucose-Capillary: 125 mg/dL — ABNORMAL HIGH (ref 70–99)
Glucose-Capillary: 131 mg/dL — ABNORMAL HIGH (ref 70–99)
Glucose-Capillary: 131 mg/dL — ABNORMAL HIGH (ref 70–99)
Glucose-Capillary: 155 mg/dL — ABNORMAL HIGH (ref 70–99)

## 2024-10-29 LAB — CBC WITH DIFFERENTIAL/PLATELET
Abs Immature Granulocytes: 0.11 K/uL — ABNORMAL HIGH (ref 0.00–0.07)
Basophils Absolute: 0 K/uL (ref 0.0–0.1)
Basophils Relative: 0 %
Eosinophils Absolute: 0.1 K/uL (ref 0.0–0.5)
Eosinophils Relative: 1 %
HCT: 26.5 % — ABNORMAL LOW (ref 36.0–46.0)
Hemoglobin: 8.6 g/dL — ABNORMAL LOW (ref 12.0–15.0)
Immature Granulocytes: 1 %
Lymphocytes Relative: 4 %
Lymphs Abs: 0.4 K/uL — ABNORMAL LOW (ref 0.7–4.0)
MCH: 30.5 pg (ref 26.0–34.0)
MCHC: 32.5 g/dL (ref 30.0–36.0)
MCV: 94 fL (ref 80.0–100.0)
Monocytes Absolute: 0.8 K/uL (ref 0.1–1.0)
Monocytes Relative: 9 %
Neutro Abs: 7.5 K/uL (ref 1.7–7.7)
Neutrophils Relative %: 85 %
Platelets: 116 K/uL — ABNORMAL LOW (ref 150–400)
RBC: 2.82 MIL/uL — ABNORMAL LOW (ref 3.87–5.11)
RDW: 15.1 % (ref 11.5–15.5)
Smear Review: NORMAL
WBC: 8.9 K/uL (ref 4.0–10.5)
nRBC: 0 % (ref 0.0–0.2)

## 2024-10-29 LAB — HEPATIC FUNCTION PANEL
ALT: 23 U/L (ref 0–44)
AST: 73 U/L — ABNORMAL HIGH (ref 15–41)
Albumin: 2.2 g/dL — ABNORMAL LOW (ref 3.5–5.0)
Alkaline Phosphatase: 133 U/L — ABNORMAL HIGH (ref 38–126)
Bilirubin, Direct: 0.2 mg/dL (ref 0.0–0.2)
Indirect Bilirubin: 0.1 mg/dL — ABNORMAL LOW (ref 0.3–0.9)
Total Bilirubin: 0.3 mg/dL (ref 0.0–1.2)
Total Protein: 4.8 g/dL — ABNORMAL LOW (ref 6.5–8.1)

## 2024-10-29 LAB — LIPID PANEL
Cholesterol: 91 mg/dL (ref 0–200)
HDL: 12 mg/dL — ABNORMAL LOW (ref >40)
LDL Cholesterol: 13 mg/dL (ref 0–99)
Total CHOL/HDL Ratio: 7.6 ratio
Triglycerides: 331 mg/dL — ABNORMAL HIGH (ref <150)
VLDL: 66 mg/dL — ABNORMAL HIGH (ref 0–40)

## 2024-10-29 LAB — PHOSPHORUS: Phosphorus: 2.4 mg/dL — ABNORMAL LOW (ref 2.5–4.6)

## 2024-10-29 LAB — MRSA NEXT GEN BY PCR, NASAL: MRSA by PCR Next Gen: NOT DETECTED

## 2024-10-29 LAB — PROTIME-INR
INR: 1.1 (ref 0.8–1.2)
Prothrombin Time: 15.1 s (ref 11.4–15.2)

## 2024-10-29 LAB — AMMONIA: Ammonia: 14 umol/L (ref 9–35)

## 2024-10-29 MED ORDER — POTASSIUM & SODIUM PHOSPHATES 280-160-250 MG PO PACK
2.0000 | PACK | Freq: Two times a day (BID) | ORAL | Status: AC
  Administered 2024-10-29 (×2): 2
  Filled 2024-10-29 (×2): qty 2

## 2024-10-29 MED ORDER — POTASSIUM CHLORIDE 10 MEQ/100ML IV SOLN
10.0000 meq | INTRAVENOUS | Status: AC
  Administered 2024-10-29 (×2): 10 meq via INTRAVENOUS
  Filled 2024-10-29 (×2): qty 100

## 2024-10-29 MED ORDER — FUROSEMIDE 10 MG/ML IJ SOLN
40.0000 mg | Freq: Once | INTRAMUSCULAR | Status: AC
  Administered 2024-10-29: 40 mg via INTRAVENOUS
  Filled 2024-10-29: qty 4

## 2024-10-29 MED ORDER — FENTANYL CITRATE (PF) 50 MCG/ML IJ SOSY
25.0000 ug | PREFILLED_SYRINGE | INTRAMUSCULAR | Status: DC | PRN
  Administered 2024-10-29 (×2): 50 ug via INTRAVENOUS
  Administered 2024-10-30 – 2024-10-31 (×2): 25 ug via INTRAVENOUS
  Filled 2024-10-29 (×4): qty 1

## 2024-10-29 MED ORDER — VITAL AF 1.2 CAL PO LIQD
1000.0000 mL | ORAL | Status: AC
  Administered 2024-10-29 – 2024-11-07 (×7): 1000 mL
  Filled 2024-10-29 (×3): qty 1000

## 2024-10-29 MED ORDER — POTASSIUM CHLORIDE 20 MEQ PO PACK
40.0000 meq | PACK | Freq: Once | ORAL | Status: AC
  Administered 2024-10-29: 40 meq
  Filled 2024-10-29: qty 2

## 2024-10-29 NOTE — Plan of Care (Signed)
  Problem: Clinical Measurements: Goal: Will remain free from infection Outcome: Progressing Goal: Cardiovascular complication will be avoided Outcome: Progressing   Problem: Nutrition: Goal: Adequate nutrition will be maintained Outcome: Progressing   Problem: Elimination: Goal: Will not experience complications related to urinary retention Outcome: Progressing   Problem: Ischemic Stroke/TIA Tissue Perfusion: Goal: Complications of ischemic stroke/TIA will be minimized Outcome: Progressing   Problem: Education: Goal: Knowledge of disease or condition will improve Outcome: Not Progressing Goal: Knowledge of secondary prevention will improve (MUST DOCUMENT ALL) Outcome: Not Progressing Goal: Knowledge of patient specific risk factors will improve (DELETE if not current risk factor) Outcome: Not Progressing   Problem: Coping: Goal: Will verbalize positive feelings about self Outcome: Not Progressing Goal: Will identify appropriate support needs Outcome: Not Progressing

## 2024-10-29 NOTE — Progress Notes (Signed)
 PT Cancellation Note  Patient Details Name: Erin [name] MRN: [id number] DOB: 1956/02/27   Cancelled Treatment:    Reason Eval/Treat Not Completed: Other (comment). Per RN, pt transferring to [facility]. Will follow for PT eval as schedule permits.   Metta Ave PT, DPT 10/29/24, 11:58 AM

## 2024-10-29 NOTE — Evaluation (Signed)
 SLP Cancellation Note  Patient Details Name: ARBIE REISZ MRN: [id number] DOB: 10-05-1956   Cancelled treatment:       Reason Eval/Treat Not Completed: Other (comment) (pt remains on a vent at this time; will continue efforts)  Madelin POUR, MS [facility] SLP Acute Rehab Services Office [phone]  Nicolas Emmie Caldron 10/29/2024, 8:03 AM

## 2024-10-29 NOTE — Hospital Course (Addendum)
 Postoperative patient had some hypotension episodes.  She was In the PACU.  She did undergo transfusion after low hemoglobin result.  She was admitted postoperatively to the stepdown unit.  Subsequently that night patient had continued low hemoglobins.  She underwent CTA.  This significant for ongoing bleeding from the right epigastric.  Patient was taken back emergently for an ex lap.  Please see op note for details.  There was no overt bleeding that was found that night.  She was packed left open.  She was taken back approximate 24 hours later for reexploration.  She was kept on the vent during this time.  Upon her second takeback and after removing her ABThera VAC there was no further bleeding that can be seen.  The epigastrics were interrogated closely without any signs of bleeding.  At this time I decided to close her abdomen.  This was done in the usual fashion.  She was taken back to the ICU intubated.  We continued to trend her hemoglobins which were stable.  She did have an increase in creatinine during this time did have an issue with chronic kidney disease.  At this time she was having difficulty awakening  once sedation was removed.  This occurred through the weekend and without gaining any ground with extubation issues.  At this time patient underwent CT head which revealed some embolic strokes.   neurology was consulted at that time.  She was started on aspirin .  She did undergo workup per their progress notes.  Patient continued to stay in the ICU intubated without sedation.  She was arousable to pain.  During this time she was started on tube feeds as pressures were removed postoperatively.   CTA Abd: IMPRESSION: 1. Large right intraperitoneal hematoma with active bleeding from the right epigastric artery. Large volume hemorrhage throughout the abdomen and pelvis. 2. 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 3. Diminutive right renal artery  with infarct in the upper pole of the right kidney. 4. Severe narrowing of the SMA immediately distal to the stent, which is patent. 5. Reactive inflammation of the small bowel colon. 6. Low-density blood pool on noncontrast images compatible with anemia. 7. Body wall anasarca. Critical value/emergent results were called by telephone at the time of interpretation on 12 / 4 / 25 at 1:10 pm to Dr. DOROTHA Na, who verbally acknowledged these results.  CT H : MPRESSION: 1. Interval development of multiple cortical and subcortical edema areas involving bilateral cerebral and cerebellar hemispheres. These areas are highly suggestive of embolic infarcts. Correlation with MRI of the brain without and with gadolinium contrast is suggested.  MRI brain: IMPRESSION: 1. 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 2. Associated mild edema, particularly within the right occipital lobe and left temporal lobe. No midline shift. 3. Few scattered areas of susceptibility and T1 hyperintensity, possibly reflecting petechial hemorrhage.

## 2024-10-29 NOTE — Progress Notes (Signed)
 NAME:  Erin [name], MRN:  [id number], DOB:  [birth year], LOS: 6 ADMISSION DATE:  10/23/2024, CONSULTATION DATE:  10/23/2024 REFERRING MD: Rubin - [facility], CHIEF COMPLAINT:  Hypotension   History of Present Illness:  68 year old woman who presented to [facility] 12/3 for elective hernia repair. 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  Postoperatively, patient remained hypotensive on pressors. Pt is laying in bed calling out in pain of legs and abdomen. Feels that she needs to keep her legs bent. Appears very pale in skin and mucosal membranes. Pt is oriented but has requested all questions are directed to her husband at this time. Pt does endorse dizziness, despite being supine and feeling weak and cold. Otherwise ROS negative or pt is not able to verbalize other symptoms.    Husband on the phone states that leg pain is chronic and typically has been 2/2 low mag and potassium for which she is reportedly on supplementation for. Husband also states that she recently (10/14) had mesenteric artery stenting and he is concerned that this is contributing to her presentation post operatively from the hernia repair. I have discussed with him plan for cvc, repeat labs and likely cta to eval for active bleed. He agrees with plan and consents to further blood product transfusion.     PCCM was asked to consult for hypotension.  Pertinent Medical History:  Kidney stent for stenosis Abdominal wall hernia present for about 2 years Bilateral femoral bypass surgery in 2014 History of mesenteric stent 09/09/2024  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/14 60.4Gy/29fx and  chemo   Anxiety    Aortoiliac occlusive disease (HCC) 08/14/2017   Arthritis    B12 deficiency anemia 09/14/2015   Blood transfusion without reported diagnosis    BPPV (benign paroxysmal positional vertigo) 07/08/2015   Cardiomyopathy (HCC) 11/03/2017   Chronic back pain    Chronic daily headache 03/29/2013   takes bc powder   Closed right hip fracture (HCC) 09/10/2015   Coronary artery disease involving native coronary artery of native heart without angina pectoris 10/13/2017   DES to mid RCA   Depression    Essential (hemorrhagic) thrombocythemia (HCC) 09/06/2018   Essential hypertension 09/25/2023   Family history of early CAD 04/08/2016   GERD (gastroesophageal reflux disease)    History of hiatal hernia    Hot flashes    Hyperlipidemia    Hyperlipidemia associated with type 2 diabetes mellitus (HCC) 05/11/2017   Hypothyroidism 09/10/2015   IBS (irritable bowel syndrome) 03/29/2013   Neck pain 07/08/2015   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   takes neurotin   Peripheral vascular disease 11/03/2017   QT prolongation    Sacroiliitis 09/06/2018   Spasms of the hands or feet 10/08/2017   Syncope 06/07/2016   Tubular adenoma of colon 09/08/2003   Vertigo    Wears glasses    Significant Hospital Events: Including procedures, antibiotic start and stop dates in addition to other pertinent events   12/3 admitted to ICU postop 12/3-post hernia repair 12/4 taken back to the OR for hemoperitoneum-active bleed was not identified, abdomen packed with wound VAC in place 12/5 back to the OR for an open abdomen-no evidence of ongoing  bleeding and abdomen was closed 12/8 - CT Head with interval development (since 04/2024) of multiple cortical and subcortical hypodensities in bilateral cerebellar and cerebral hemispheres suggestive of acute/subacute infarcts. MRI Brain with numerous areas of restricted diffusion throughout the cerebral hemispheres/cerebellum c/f acute infarcts of embolic etiology,  associated mild edema (R occipital lobe, L temporal lobe).   Interim History/Subjective:  No significant events overnight MRI Brain as above, husband updated Appreciate Neuro input, ASA 81mg  started Transferring to [facility] today per [facility] request  Objective:    Blood pressure (!) 186/120, pulse 93, temperature 99.3 F (37.4 C), temperature source Axillary, resp. rate 17, height 5' 3 (1.6 m), weight 53.2 kg, SpO2 97%.    Vent Mode: PRVC FiO2 (%):  [30 %-100 %] 30 % Set Rate:  [14 bmp] 14 bmp Vt Set:  [420 mL] 420 mL PEEP:  [5 cmH20] 5 cmH20 Plateau Pressure:  [11 cmH20-16 cmH20] 11 cmH20   Intake/Output Summary (Last 24 hours) at 10/29/2024 0844 Last data filed at 10/29/2024 0545 Gross per 24 hour  Intake 1268.96 ml  Output 2120 ml  Net -851.04 ml   Filed Weights   10/23/24 0709 10/28/24 1000 10/29/24 0500  Weight: 44.4 kg 53.9 kg 53.2 kg   Physical Examination: General: Acutely ill-appearing older woman in NAD. HEENT: [place]/AT, anicteric sclera, PERRL 5mm, moist mucous membranes. Neuro: Intubated, somnolent, opens eyes to noxious stimuli. Withdraws to pain in BLE. Not following commands. No spontaneous movement of extremities noted. +Corneal, +Cough, and +Gag  CV: RRR, no m/g/r. PULM: Breathing even and unlabored on vent (PSV 5/5, FiO2 30%). Lung fields clear in upper fields, diminished at bases R > L. Thick tan-yellow secretions in ETT. GI: Soft, appropriately TTP postoperatively, nondistended. Normoactive bowel sounds. Midline wound clean/dry/intact without erythema/drainage, honeycomb postop dressing in place. Extremities: Bilateral symmetric 1+ LE edema noted. Improved edema of bilateral hands. Skin: Warm/dry, no rashes.  Resolved Problem List:    Assessment and Plan:   Acute bilateral cerebral and cerebellar infarcts CT Head with interval development (since 04/2024) of multiple cortical and subcortical hypodensities in bilateral cerebellar and cerebral hemispheres suggestive  of acute/subacute infarcts. Concern for embolic strokes 2/2 hypotension in the setting of hemorrhagic shock versus DIC, hypoperfusion vs. hypercoagulability. MRI Brain with numerous areas of restricted diffusion throughout the cerebral hemispheres/cerebellum c/f acute infarcts of embolic etiology, associated mild edema (R occipital lobe, L temporal lobe). - Neurology consulted, appreciate recommendations - Out of the window for any interventions at this juncture - Additional imaging per Neuro, not indicated at this time - Continue ASA 81mg  daily (ok per CCS) - Echo/lipid panel/A1c complete - Frequent neuro checks - Neuroprotective measures: HOB > 30 degrees, normoglycemia, normothermia, electrolytes WNL - PT/OT/SLP when able to participate in care  Hemorrhagic shock, resolved Concern for DIC Thrombocytopenia History to HTN Received blood products (4U PRBCs, 1U FFP, 1U Cryo). - Goal MAP > 65 - Volume resuscitation as indicated; appears volume up at present - Remains off of vasopressors - Hold home antihypertensives - Consider Heme consult for persistent thrombocytopenia  S/p hernia repair, elective 12/3 S/p ex-lap 12/4, 12/5 - Postoperative management per [facility] - Multimodal pain management once awake; limiting sedating medications currently - Wound care per surgery - Transferred to [facility] 12/9 per [facility] request, may need trach/PEG pending progress and GOC discussions  Acute kidney injury, improving - Trend BMP - Replete electrolytes as indicated - Monitor I&Os - Avoid nephrotoxic agents as able - Ensure adequate renal perfusion  Respiratory insufficiency  Increased tracheal secretions Cultures requested-abundant gram-negative rods, few gram-positive cocci. - Continue full vent support (4-8cc/kg IBW) - Wean FiO2 for O2 sat > 90% - Daily WUA/SBT, extubation remains precluded by mental status - VAP bundle - Pulmonary hygiene - PAD protocol for sedation: Off of sedation at present -  Repeat Resp Cx ordered 12/9  At risk for malnutrition - Appreciate RD assistance - TF per [facility], advance as tolerated  GOC - FULL CODE at present - Unclear prognosis/trajectory of recovery with degree of strokes - If no improvement, would benefit from PMT  Critical care time:   The patient is critically ill with multiple organ system failure and requires high complexity decision making for assessment and support, frequent evaluation and titration of therapies, advanced monitoring, review of radiographic studies and interpretation of complex data.   Critical Care Time devoted to patient care services, exclusive of separately billable procedures, described in this note is 36 minutes.  Corean [name] Rieley Hausman, PA-C [facility] 10/29/24 8:44 AM  Please see [email or web address] for pager details.  From 7A-7P if no response, please call [phone] After hours, please call ELink [phone]

## 2024-10-29 NOTE — Progress Notes (Signed)
 Carotid duplex  has been completed. Refer to [facility] under chart review to view preliminary results.   10/29/2024  2:12 PM Zackery Brine, Ricka BIRCH

## 2024-10-29 NOTE — Progress Notes (Signed)
 4 Days Post-Op   Subjective/Chief Complaint: MRI reports noted Appreciate Neuro assistance   Objective: Vital signs in last 24 hours: Temp:  [98.2 F (36.8 C)-100.2 F (37.9 C)] 98.2 F (36.8 C) (12/09 0403) Pulse Rate:  [56-79] 79 (12/09 0500) Resp:  [10-17] 16 (12/09 0500) BP: (109-189)/(37-63) 175/63 (12/09 0500) SpO2:  [94 %-100 %] 96 % (12/09 0500) FiO2 (%):  [30 %-100 %] 30 % (12/09 0403) Weight:  [53.2 kg-53.9 kg] 53.2 kg (12/09 0500) Last BM Date :  (PTA)  Intake/Output from previous day: 12/08 0701 - 12/09 0700 In: 1319 [I.V.:638.7; NG/GT:380.3; IV Piggyback:299.9] Out: 2120 [Urine:2120] Intake/Output this shift: Total I/O In: -  Out: 1725 [Urine:1725]  General appearance: responsive Cardio: regular rate and rhythm, S1, S2 normal, no murmur, click, rub or gallop GI: soft, non-tender; bowel sounds normal; no masses,  no organomegaly and inc c/d/i Neurologic: Sensory: responds to pain  Lab Results:  Recent Labs    10/28/24 0450 10/29/24 0530  WBC 6.6 8.9  HGB 8.0* 8.6*  HCT 24.4* 26.5*  PLT 82* 116*   BMET Recent Labs    10/28/24 0450 10/29/24 0530  NA 144 142  K 3.3* 3.3*  CL 108 107  CO2 25 28  GLUCOSE 119* 137*  BUN 37* 41*  CREATININE 2.65* 2.43*  CALCIUM  7.7* 8.0*   PT/INR Recent Labs    10/29/24 0530  LABPROT 15.1  INR 1.1   ABG Recent Labs    10/27/24 1015  PHART 7.39  HCO3 26.0    Studies/Results: MR ANGIO HEAD WO CONTRAST Result Date: 10/28/2024 EXAM: MR Angiography Head without intravenous Contrast. 10/28/2024 05:58:31 PM TECHNIQUE: Magnetic resonance angiography images of the head without intravenous contrast. Multiplanar 2D and 3D reformatted images are provided for review. COMPARISON: Same day CT head and MRI head. CLINICAL HISTORY: Stroke, follow up. FINDINGS: ANTERIOR CIRCULATION: The visualized distal cervical internal carotid arteries and intracranial internal carotid arteries are patent bilaterally. There is a 2 mm  inferiorly directed outpouching along the right supraclinoid ICA which likely reflects an infundibulum at the origin of the posterior communicating artery versus a small aneurysm. No significant stenosis of the anterior cerebral arteries. No significant stenosis of the middle cerebral arteries. No other aneurysm is identified in the anterior circulation. POSTERIOR CIRCULATION: No significant stenosis of the posterior cerebral arteries. No significant stenosis of the basilar artery. No significant stenosis of the vertebral arteries. No aneurysm. IMPRESSION: 1. No large vessel occlusion. No significant intracranial arterial stenosis. 2. 2 mm inferiorly directed outpouching along the right supraclinoid ICA, most consistent with an infundibulum at the posterior communicating artery origin. A small aneurysm is less likely but not excluded. Electronically signed by: Donnice Mania MD 10/28/2024 06:40 PM EST RP Workstation: [id number]   MR BRAIN WO CONTRAST Result Date: 10/28/2024 EXAM: MRI BRAIN WITHOUT CONTRAST 10/28/2024 05:58:31 PM TECHNIQUE: Multiplanar multisequence MRI of the head/brain was performed without the administration of intravenous contrast. COMPARISON: Same day CT head. CLINICAL HISTORY: Stroke, follow up. FINDINGS: BRAIN AND VENTRICLES: Numerous scattered areas of restricted diffusion throughout the cerebral hemispheres and cerebellum concerning for infarcts. Dominant lesions noted within the inferior left cerebellum on series 5 image 9 as well as within the inferior aspects of the temporal lobes and right occipital lobe on series 5 image 20. There are lesions throughout multiple vascular territories bilaterally. Associated edema particularly within the right occipital lobe and left temporal lobe with local mass effect and sulcal effacement. There are few scattered areas of susceptibility  and T1 hyperintensity which could reflect petechial hemorrhage. Cerebral volume within normal limits for patients  age. No hydrocephalus. The sella is unremarkable. Normal flow voids. ORBITS: No acute abnormality. SINUSES AND MASTOIDS: Trace bilateral mastoid effusions. Scattered mucosal thickening in the ethmoid and sphenoid sinuses. BONES AND SOFT TISSUES: Normal marrow signal. Partially visualized enteric tube. No acute soft tissue abnormality. IMPRESSION: 1. 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 2. Associated mild edema, particularly within the right occipital lobe and left temporal lobe. No midline shift. 3. Few scattered areas of susceptibility and T1 hyperintensity, possibly reflecting petechial hemorrhage. Electronically signed by: Donnice Mania MD 10/28/2024 06:22 PM EST RP Workstation: [id number]   ECHOCARDIOGRAM COMPLETE Result Date: 10/28/2024    ECHOCARDIOGRAM REPORT   Patient Name:   Erin [name] Date of Exam: 10/28/2024 Medical Rec #:  [id number]         Height:       63.0 in Accession #:    [id number]        Weight:       118.8 lb Date of Birth:  June 30, 1956         BSA:          1.550 m Patient Age:    68 years          BP:           106/37 mmHg Patient Gender: F                 HR:           60 bpm. Exam Location:  Inpatient Procedure: 2D Echo (Both Spectral and Color Flow Doppler were utilized during            procedure). Indications:    Stroke  History:        Patient has prior history of Echocardiogram examinations.                 Stroke.  Sonographer:    Norleen Amour Referring Phys: [id number] ASHISH ARORA IMPRESSIONS  1. Left ventricular ejection fraction, by estimation, is 60 to 65%. Left ventricular ejection fraction by 2D MOD biplane is 64.6 %. The left ventricle has normal function. The left ventricle has no regional wall motion abnormalities. Left ventricular diastolic parameters were normal.  2. Right ventricular systolic function is normal. The right ventricular size is normal. There  is normal pulmonary artery systolic pressure. The estimated right ventricular systolic pressure is 24.9 mmHg.  3. The mitral valve was not well visualized. No evidence of mitral valve regurgitation.  4. The aortic valve was not well visualized. Aortic valve regurgitation is not visualized.  5. The inferior vena cava is normal in size with greater than 50% respiratory variability, suggesting right atrial pressure of 3 mmHg. Comparison(s): No prior Echocardiogram. FINDINGS  Left Ventricle: Left ventricular ejection fraction, by estimation, is 60 to 65%. Left ventricular ejection fraction by 2D MOD biplane is 64.6 %. The left ventricle has normal function. The left ventricle has no regional wall motion abnormalities. The left ventricular internal cavity size was normal in size. There is no left ventricular hypertrophy. Left ventricular diastolic parameters were normal. Right Ventricle: The right ventricular size is normal. No increase in right ventricular wall thickness. Right ventricular systolic function is normal. There is normal pulmonary artery systolic pressure. The tricuspid regurgitant velocity is 2.34 m/s, and  with an assumed right atrial pressure of 3 mmHg,  the estimated right ventricular systolic pressure is 24.9 mmHg. Left Atrium: Left atrial size was normal in size. Right Atrium: Right atrial size was normal in size. Pericardium: There is no evidence of pericardial effusion. Mitral Valve: The mitral valve was not well visualized. No evidence of mitral valve regurgitation. MV peak gradient, 3.6 mmHg. The mean mitral valve gradient is 1.0 mmHg. Tricuspid Valve: The tricuspid valve is grossly normal. Tricuspid valve regurgitation is trivial. Aortic Valve: The aortic valve was not well visualized. Aortic valve regurgitation is not visualized. Aortic valve mean gradient measures 4.0 mmHg. Aortic valve peak gradient measures 8.2 mmHg. Aortic valve area, by VTI measures 1.54 cm. Pulmonic Valve: The pulmonic  valve was normal in structure. Pulmonic valve regurgitation is not visualized. Aorta: The aortic root and ascending aorta are structurally normal, with no evidence of dilitation. Venous: The inferior vena cava is normal in size with greater than 50% respiratory variability, suggesting right atrial pressure of 3 mmHg. IAS/Shunts: No atrial level shunt detected by color flow Doppler.  LEFT VENTRICLE PLAX 2D                        Biplane EF (MOD) LVIDd:         4.30 cm         LV Biplane EF:   Left LVIDs:         2.50 cm                          ventricular LV PW:         0.80 cm                          ejection LV IVS:        0.70 cm                          fraction by LVOT diam:     1.80 cm                          2D MOD LV SV:         39                               biplane is LV SV Index:   25                               64.6 %. LVOT Area:     2.54 cm                                Diastology                                LV e' medial:    7.18 cm/s LV Volumes (MOD)               LV E/e' medial:  14.2 LV vol d, MOD    40.2 ml       LV e' lateral:   9.14 cm/s A2C:  LV E/e' lateral: 11.2 LV vol d, MOD    47.1 ml A4C: LV vol s, MOD    11.8 ml A2C: LV vol s, MOD    21.0 ml A4C: LV SV MOD A2C:   28.4 ml LV SV MOD A4C:   47.1 ml LV SV MOD BP:    28.9 ml RIGHT VENTRICLE             IVC RV S prime:     11.00 cm/s  IVC diam: 1.20 cm TAPSE (M-mode): 2.2 cm                             PULMONARY VEINS                             Diastolic Velocity: 30.80 cm/s                             S/D Velocity:       1.00                             Systolic Velocity:  30.80 cm/s LEFT ATRIUM             Index        RIGHT ATRIUM          Index LA diam:        2.50 cm 1.61 cm/m   RA Area:     6.54 cm LA Vol (A2C):   24.2 ml 15.61 ml/m  RA Volume:   11.10 ml 7.16 ml/m LA Vol (A4C):   24.0 ml 15.49 ml/m LA Biplane Vol: 24.8 ml 16.00 ml/m  AORTIC VALVE                    PULMONIC VALVE AV Area (Vmax):     1.50 cm     PV Vmax:       0.87 m/s AV Area (Vmean):   1.55 cm     PV Peak grad:  3.0 mmHg AV Area (VTI):     1.54 cm AV Vmax:           143.00 cm/s AV Vmean:          94.300 cm/s AV VTI:            0.256 m AV Peak Grad:      8.2 mmHg AV Mean Grad:      4.0 mmHg LVOT Vmax:         84.20 cm/s LVOT Vmean:        57.300 cm/s LVOT VTI:          0.155 m LVOT/AV VTI ratio: 0.61  AORTA Ao Root diam: 2.30 cm Ao Asc diam:  2.60 cm MITRAL VALVE                TRICUSPID VALVE MV Area (PHT): 3.89 cm     TR Peak grad:   21.9 mmHg MV Area VTI:   1.37 cm     TR Vmax:        234.00 cm/s MV Peak grad:  3.6 mmHg MV Mean grad:  1.0 mmHg     SHUNTS MV Vmax:       0.95 m/s     Systemic VTI:  0.16 m MV Vmean:  56.4 cm/s    Systemic Diam: 1.80 cm MV Decel Time: 195 msec MV E velocity: 102.00 cm/s MV A velocity: 61.30 cm/s MV E/A ratio:  1.66 Vinie Maxcy MD Electronically signed by Vinie Maxcy MD Signature Date/Time: 10/28/2024/3:58:07 PM    Final    EEG adult Result Date: 10/28/2024 Shelton Arlin KIDD, MD     10/28/2024  2:17 PM Patient Name: TANEEKA CURTNER MRN: [id number] Epilepsy Attending: Arlin KIDD Shelton Referring Physician/Provider: Ilah Corean [name], PA-C Date: 10/28/2024 Duration: 24.28 mins Patient history: 68yo F with ams. EEG to evaluate for seizure Level of alertness: comatose/lethargic AEDs during EEG study: None Technical aspects: This EEG study was done with scalp electrodes positioned according to the 10-20 International system of electrode placement. Electrical activity was reviewed with band pass filter of 1-70Hz , sensitivity of 7 uV/mm, display speed of 69mm/sec with a 60Hz  notched filter applied as appropriate. EEG data were recorded continuously and digitally stored.  Video monitoring was available and reviewed as appropriate. Description: EEG showed continuous generalized polymorphic high amplitude 3 to 5 Hz theta-delta slowing. Hyperventilation and photic stimulation were not performed.   ABNORMALITY  - Continuous slow, generalized IMPRESSION: This study is suggestive of generalized cerebral dysfunction ( encephalopathy). No seizures or epileptiform discharges were seen throughout the recording. Priyanka KIDD Shelton   CT HEAD WO CONTRAST ( ) Result Date: 10/28/2024 EXAM: CT HEAD WITHOUT CONTRAST 10/28/2024 10:26:05 AM TECHNIQUE: CT of the head was performed without the administration of intravenous contrast. Automated exposure control, iterative reconstruction, and/or weight based adjustment of the mA/kV was utilized to reduce the radiation dose to as low as reasonably achievable. COMPARISON: 05/03/2024 CLINICAL HISTORY: Mental status change, unknown cause FINDINGS: BRAIN AND VENTRICLES: No acute hemorrhage. No evidence of acute infarct. No hydrocephalus. No extra-axial collection. No mass effect or midline shift. Interval development of multiple cortical and subcortical edema areas involving bilateral cerebral and cerebellar hemispheres. ORBITS: No acute abnormality. SINUSES: Left frontal sinus mucosal thickening. SOFT TISSUES AND SKULL: No acute soft tissue abnormality. No skull fracture. Partially visualized nasogastric tube in place. IMPRESSION: 1. Interval development of multiple cortical and subcortical edema areas involving bilateral cerebral and cerebellar hemispheres. These areas are highly suggestive of embolic infarcts. Correlation with MRI of the brain without and with gadolinium contrast is suggested. Electronically signed by: Evalene Coho MD 10/28/2024 10:56 AM EST RP Workstation: [id number]    Anti-infectives: Anti-infectives (From admission, onward)    Start     Dose/Rate Route Frequency Ordered Stop   10/28/24 1600  ceFEPIme  (MAXIPIME ) 2 g in sodium chloride  0.9 % 100 mL IVPB        2 g 200 mL/hr over 30 Minutes Intravenous Every 24 hours 10/28/24 1441     10/26/24 1800  cefTRIAXone  (ROCEPHIN ) 2 g in sodium chloride  0.9 % 100 mL IVPB  Status:  Discontinued        2 g 200 mL/hr  over 30 Minutes Intravenous Daily-1800 10/26/24 1739 10/28/24 1441   10/23/24 0700  ceFAZolin  (ANCEF ) IVPB 2g/100 mL premix        2 g 200 mL/hr over 30 Minutes Intravenous On call to O.R. 10/23/24 9351 10/23/24 9166       Assessment/Plan: s/p Procedure(s) with comments: LAPAROTOMY, EXPLORATORY (N/A) - REMOVAL OF PACKING POD 5 from lap VHR and reexploration for bleeding, POD 3 from reexploration and abdominal closure -AKI appears to be improving some, appreciate Nephro assistance -hypokalemia will replace, given lasix  overnight -Appreciate CCM assistance.  Breathing over the vent.  Will d/w husband later today for possible need for trach/feeding tube in the near future -OK to increase TF to goal -Hgb has been fairly stable.  Will monitor on daily basis., Plt increasing -appreciate Neuro assistance after MRI read.  On ASA  LOS: 6 days    Lynda Leos 10/29/2024

## 2024-10-29 NOTE — Progress Notes (Signed)
 OT Cancellation Note  Patient Details Name: Erin [name] MRN: [id number] DOB: 07/14/1956   Cancelled Treatment:    Reason Eval/Treat Not Completed: Other (comment)  Transferred to [facility] for care.  Gamble Enderle OT/L Acute Rehabilitation Department  [phone]    10/29/2024, 11:59 AM

## 2024-10-29 NOTE — Plan of Care (Signed)
   Problem: Clinical Measurements: Goal: Will remain free from infection Outcome: Progressing Goal: Cardiovascular complication will be avoided Outcome: Progressing   Problem: Nutrition: Goal: Adequate nutrition will be maintained Outcome: Progressing   Problem: Elimination: Goal: Will not experience complications related to urinary retention Outcome: Progressing

## 2024-10-29 NOTE — Progress Notes (Signed)
 [facility] Progress Note  Subjective:  UOP 2.1 L  Creatinine down to 2.4 today   Vitals:   10/29/24 1204 10/29/24 1305 10/29/24 1324 10/29/24 1400  BP: (!) 170/55 (!) 195/71 (!) 157/68 (!) 161/55  Pulse: 70 87 76 78  Resp:   16 19  Temp:      TempSrc:      SpO2: 96% 94% 92% 98%  Weight:      Height:        Exam: Gen: thin woman intubated on vent  ENT: ETT in place Neck: L internal jugular triple lumen central line CV: RRR Abd: abd binder in place Lungs: coarse ant on vent GU: foley draining normal yellow urine Extr: no edema Neuro: sedated but arousable per RN  H/o vascular surgeries/ procedures: 2018: Dr Early: aorta-bilat femoral artery bypass graft, embolectomy 2018: other local vascular procedures on L and R  2019: Dr Serene: angioplasty/ stenting of SMA and celiac arteries 2021: repeat angio and angioplasty of SMA and celiac arteries 2023: abd pain -> duplex showed widely patent mesenteric stents w/o any evidence of recurrent stenosis (SMA, celiac) 09/03/24: 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, 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: underwent drug-coated balloon angioplasty to the celiac and SMA artery under conscious sedation (57 min); bialt fem-pop bypass grafts were patent  Date   Creat  eGFR (ml/min) 2014- nov 2024 0.63- 0.90 67- 95 ml/min Feb 2025  1.88 >> 1.12 AKI, 29- 54 ml/min March 2025  1.06  54 ml/min   July 2025  1.38  42 ml/min   Aug 2025  1.49  38 ml/min Oct 2025  1.70      Oct 18, 2024  1.47  38 ml/min 12/03   1.81  30 ml/min  12/04   1.93  28  12/05   2.75  18 12/06   2.88  17 12/07   2.85  17 10/28/24  2.65  19 ml/min    Assessment/ Plan: AKI on CKD 3b: b/l creatinine 1.4- 1.5 from July- nov 2025, eGFR 38- 42 ml/min. Creat here was 1.8 on admission which rose to 2.8 12/06 and 12/07 and down to 2.6 today.  UOP is good. Pt admitted for hernia repair (12/03) which was complicated  by DIC and intra-abd bleeding requiring emergent ex-lap 12/04. AKI due to shock secondary to ABLA + contrast +/- DIC.  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  Would not recommend pursing the L RAS at this time.  Getting IV lasix  now 40mg  1-2 per day started last night. Is up 20 lbs - will continue.  Volume: up 20 lbs, getting IV lasix  now, will continue.  S/P hernia repair Intra-abdominal bleeding: post-op, required ex-lap w/ packing.  DIC: plts were 21K on admission, improved up to 110K now.   Shock: due to ABLA, was on pressors for 1st 24 hrs H/o vascular disease: prior aorta-bifem surgery, and also hx of stenting of the celiac/ SMA arteries in the past.    Myer Fret MD  [facility] 10/29/2024, 2:43 PM  Recent Labs  Lab 10/28/24 0450 10/28/24 1642 10/29/24 0530  HGB 8.0*  --  8.6*  ALBUMIN  2.1*  --  2.2*  2.2*  CALCIUM  7.7*  --  8.0*  PHOS 3.6 3.1 2.4*  2.4*  CREATININE 2.65*  --  2.43*  K 3.3*  --  3.3*   No results for  input(s): IRON, TIBC, FERRITIN in the last 168 hours. Inpatient medications:   stroke: early stages of recovery book   Does not apply Once   amLODipine   5 mg Per Tube Daily   aspirin   81 mg Per Tube Daily   carvedilol   12.5 mg Per Tube BID WC   Chlorhexidine  Gluconate Cloth  6 each Topical Daily   heparin  injection (subcutaneous)  5,000 Units Subcutaneous Q8H   Influenza vac split trivalent PF  0.5 mL Intramuscular Tomorrow-1000   levothyroxine   75 mcg Per Tube Q0600   mouth rinse  15 mL Mouth Rinse Q2H   pantoprazole  (PROTONIX ) IV  40 mg Intravenous QHS   potassium & sodium phosphates   2 packet Per Tube BID    ceFEPime  (MAXIPIME ) IV Stopped (10/28/24 1647)   feeding supplement (VITAL AF 1.2 CAL) 1,000 mL (10/29/24 1345)   acetaminophen , fentaNYL  (SUBLIMAZE ) injection, fentaNYL  (SUBLIMAZE ) injection, hydrALAZINE , labetalol , methocarbamol , ondansetron  **OR** ondansetron  (ZOFRAN ) IV, mouth rinse,  oxyCODONE 

## 2024-10-29 NOTE — Progress Notes (Signed)
 Brief Nutrition Support Update  Per Surgery note today, can increase tube feeds to goal. No noted tube feed intolerance. Adjusted orders to increase to goal rate of 12mL/hr.  Spoke with bedside RN who reports patient is transferring to [facility] today.  INTERVENTION: - Increase tube feeds to goal: Vital 1.2 at 50 ml/h (1200 ml per day) Provides 1440 kcal, 90 gm protein, 973 ml free water  daily   - Monitor magnesium , potassium, and phosphorus daily for at least 3 days, MD to replete as needed, as pt is at risk for refeeding syndrome.   - FWF per CCM.    Trude Ned RD, LDN Contact via [employer].

## 2024-10-29 NOTE — Progress Notes (Addendum)
 STROKE TEAM PROGRESS NOTE    SIGNIFICANT HOSPITAL EVENTS 12/3 - hernia repair 12/4 taken back to the OR for hemoperitoneum-active bleed was not identified, abdomen packed with wound VAC in place 12/5 back to the OR for an open abdomen-no evidence of ongoing bleeding and abdomen was closed 12/8 - CT Head and MRI brain with bil strokes   INTERIM HISTORY/SUBJECTIVE Transferred from [facility] to [facility] 4N ICU.  Husband is at the bedside.  She remains intubated, no sedation infusing at the time of my exam.  No withdrawal to noxious stimuli, but she does have spontaneous movements in her upper extremities.  No movement in her lower extremities.  Not following commands, does not track examiner.    OBJECTIVE  CBC    Component Value Date/Time   WBC 8.9 10/29/2024 0530   RBC 2.82 (L) 10/29/2024 0530   HGB 8.6 (L) 10/29/2024 0530   HGB 11.9 11/08/2019 1403   HGB 10.6 (L) 11/28/2013 1148   HCT 26.5 (L) 10/29/2024 0530   HCT 35.7 11/08/2019 1403   HCT 31.2 (L) 11/28/2013 1148   PLT 116 (L) 10/29/2024 0530   PLT 250 11/08/2019 1403   MCV 94.0 10/29/2024 0530   MCV 99 (H) 11/08/2019 1403   MCV 101.4 (H) 11/28/2013 1148   MCH 30.5 10/29/2024 0530   MCHC 32.5 10/29/2024 0530   RDW 15.1 10/29/2024 0530   RDW 11.8 11/08/2019 1403   RDW 15.4 (H) 11/28/2013 1148   LYMPHSABS 0.4 (L) 10/29/2024 0530   LYMPHSABS 1.1 11/08/2019 1403   LYMPHSABS 0.8 (L) 11/28/2013 1148   MONOABS 0.8 10/29/2024 0530   MONOABS 0.9 11/28/2013 1148   EOSABS 0.1 10/29/2024 0530   EOSABS 0.0 11/08/2019 1403   BASOSABS 0.0 10/29/2024 0530   BASOSABS 0.0 11/08/2019 1403   BASOSABS 0.0 11/28/2013 1148    BMET    Component Value Date/Time   NA 142 10/29/2024 0530   NA 137 07/01/2024 1505   NA 139 04/22/2014 1028   K 3.3 (L) 10/29/2024 0530   K 4.1 04/22/2014 1028   CL 107 10/29/2024 0530   CO2 28 10/29/2024 0530   CO2 23 04/22/2014 1028   GLUCOSE 137 (H) 10/29/2024 0530   GLUCOSE 81 04/22/2014 1028   BUN 41 (H)  10/29/2024 0530   BUN 20 07/01/2024 1505   BUN 8.8 04/22/2014 1028   CREATININE 2.43 (H) 10/29/2024 0530   CREATININE 0.94 12/10/2021 1532   CREATININE 0.8 04/22/2014 1028   CALCIUM  8.0 (L) 10/29/2024 0530   CALCIUM  9.1 04/22/2014 1028   EGFR 38 (L) 07/01/2024 1505   GFRNONAA 21 (L) 10/29/2024 0530    IMAGING past 24 hours MR ANGIO HEAD WO CONTRAST Result Date: 10/28/2024 EXAM: MR Angiography Head without intravenous Contrast. 10/28/2024 05:58:31 PM TECHNIQUE: Magnetic resonance angiography images of the head without intravenous contrast. Multiplanar 2D and 3D reformatted images are provided for review. COMPARISON: Same day CT head and MRI head. CLINICAL HISTORY: Stroke, follow up. FINDINGS: ANTERIOR CIRCULATION: The visualized distal cervical internal carotid arteries and intracranial internal carotid arteries are patent bilaterally. There is a 2 mm inferiorly directed outpouching along the right supraclinoid ICA which likely reflects an infundibulum at the origin of the posterior communicating artery versus a small aneurysm. No significant stenosis of the anterior cerebral arteries. No significant stenosis of the middle cerebral arteries. No other aneurysm is identified in the anterior circulation. POSTERIOR CIRCULATION: No significant stenosis of the posterior cerebral arteries. No significant stenosis of the basilar artery. No  significant stenosis of the vertebral arteries. No aneurysm. IMPRESSION: 1. No large vessel occlusion. No significant intracranial arterial stenosis. 2. 2 mm inferiorly directed outpouching along the right supraclinoid ICA, most consistent with an infundibulum at the posterior communicating artery origin. A small aneurysm is less likely but not excluded. Electronically signed by: Donnice Mania MD 10/28/2024 06:40 PM EST RP Workstation: [id number]   MR BRAIN WO CONTRAST Result Date: 10/28/2024 EXAM: MRI BRAIN WITHOUT CONTRAST 10/28/2024 05:58:31 PM TECHNIQUE: Multiplanar  multisequence MRI of the head/brain was performed without the administration of intravenous contrast. COMPARISON: Same day CT head. CLINICAL HISTORY: Stroke, follow up. FINDINGS: BRAIN AND VENTRICLES: Numerous scattered areas of restricted diffusion throughout the cerebral hemispheres and cerebellum concerning for infarcts. Dominant lesions noted within the inferior left cerebellum on series 5 image 9 as well as within the inferior aspects of the temporal lobes and right occipital lobe on series 5 image 20. There are lesions throughout multiple vascular territories bilaterally. Associated edema particularly within the right occipital lobe and left temporal lobe with local mass effect and sulcal effacement. There are few scattered areas of susceptibility and T1 hyperintensity which could reflect petechial hemorrhage. Cerebral volume within normal limits for patients age. No hydrocephalus. The sella is unremarkable. Normal flow voids. ORBITS: No acute abnormality. SINUSES AND MASTOIDS: Trace bilateral mastoid effusions. Scattered mucosal thickening in the ethmoid and sphenoid sinuses. BONES AND SOFT TISSUES: Normal marrow signal. Partially visualized enteric tube. No acute soft tissue abnormality. IMPRESSION: 1. 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 2. Associated mild edema, particularly within the right occipital lobe and left temporal lobe. No midline shift. 3. Few scattered areas of susceptibility and T1 hyperintensity, possibly reflecting petechial hemorrhage. Electronically signed by: Donnice Mania MD 10/28/2024 06:22 PM EST RP Workstation: [id number]   ECHOCARDIOGRAM COMPLETE Result Date: 10/28/2024    ECHOCARDIOGRAM REPORT   Patient Name:   Erin [name] Date of Exam: 10/28/2024 Medical Rec #:  [id number]         Height:       63.0 in Accession #:    [id number]        Weight:       118.8 lb Date  of Birth:  [birth year]         BSA:          1.550 m Patient Age:    68 years          BP:           106/37 mmHg Patient Gender: F                 HR:           60 bpm. Exam Location:  Inpatient Procedure: 2D Echo (Both Spectral and Color Flow Doppler were utilized during            procedure). Indications:    Stroke  History:        Patient has prior history of Echocardiogram examinations.                 Stroke.  Sonographer:    Norleen Amour Referring Phys: [id number] ASHISH ARORA IMPRESSIONS  1. Left ventricular ejection fraction, by estimation, is 60 to 65%. Left ventricular ejection fraction by 2D MOD biplane is 64.6 %. The left ventricle has normal function. The left ventricle has no regional wall motion abnormalities. Left ventricular diastolic parameters were normal.  2. Right ventricular  systolic function is normal. The right ventricular size is normal. There is normal pulmonary artery systolic pressure. The estimated right ventricular systolic pressure is 24.9 mmHg.  3. The mitral valve was not well visualized. No evidence of mitral valve regurgitation.  4. The aortic valve was not well visualized. Aortic valve regurgitation is not visualized.  5. The inferior vena cava is normal in size with greater than 50% respiratory variability, suggesting right atrial pressure of 3 mmHg. Comparison(s): No prior Echocardiogram. FINDINGS  Left Ventricle: Left ventricular ejection fraction, by estimation, is 60 to 65%. Left ventricular ejection fraction by 2D MOD biplane is 64.6 %. The left ventricle has normal function. The left ventricle has no regional wall motion abnormalities. The left ventricular internal cavity size was normal in size. There is no left ventricular hypertrophy. Left ventricular diastolic parameters were normal. Right Ventricle: The right ventricular size is normal. No increase in right ventricular wall thickness. Right ventricular systolic function is normal. There is normal pulmonary artery systolic  pressure. The tricuspid regurgitant velocity is 2.34 m/s, and  with an assumed right atrial pressure of 3 mmHg, the estimated right ventricular systolic pressure is 24.9 mmHg. Left Atrium: Left atrial size was normal in size. Right Atrium: Right atrial size was normal in size. Pericardium: There is no evidence of pericardial effusion. Mitral Valve: The mitral valve was not well visualized. No evidence of mitral valve regurgitation. MV peak gradient, 3.6 mmHg. The mean mitral valve gradient is 1.0 mmHg. Tricuspid Valve: The tricuspid valve is grossly normal. Tricuspid valve regurgitation is trivial. Aortic Valve: The aortic valve was not well visualized. Aortic valve regurgitation is not visualized. Aortic valve mean gradient measures 4.0 mmHg. Aortic valve peak gradient measures 8.2 mmHg. Aortic valve area, by VTI measures 1.54 cm. Pulmonic Valve: The pulmonic valve was normal in structure. Pulmonic valve regurgitation is not visualized. Aorta: The aortic root and ascending aorta are structurally normal, with no evidence of dilitation. Venous: The inferior vena cava is normal in size with greater than 50% respiratory variability, suggesting right atrial pressure of 3 mmHg. IAS/Shunts: No atrial level shunt detected by color flow Doppler.  LEFT VENTRICLE PLAX 2D                        Biplane EF (MOD) LVIDd:         4.30 cm         LV Biplane EF:   Left LVIDs:         2.50 cm                          ventricular LV PW:         0.80 cm                          ejection LV IVS:        0.70 cm                          fraction by LVOT diam:     1.80 cm                          2D MOD LV SV:         39  biplane is LV SV Index:   25                               64.6 %. LVOT Area:     2.54 cm                                Diastology                                LV e' medial:    7.18 cm/s LV Volumes (MOD)               LV E/e' medial:  14.2 LV vol d, MOD    40.2 ml       LV e' lateral:    9.14 cm/s A2C:                           LV E/e' lateral: 11.2 LV vol d, MOD    47.1 ml A4C: LV vol s, MOD    11.8 ml A2C: LV vol s, MOD    21.0 ml A4C: LV SV MOD A2C:   28.4 ml LV SV MOD A4C:   47.1 ml LV SV MOD BP:    28.9 ml RIGHT VENTRICLE             IVC RV S prime:     11.00 cm/s  IVC diam: 1.20 cm TAPSE (M-mode): 2.2 cm                             PULMONARY VEINS                             Diastolic Velocity: 30.80 cm/s                             S/D Velocity:       1.00                             Systolic Velocity:  30.80 cm/s LEFT ATRIUM             Index        RIGHT ATRIUM          Index LA diam:        2.50 cm 1.61 cm/m   RA Area:     6.54 cm LA Vol (A2C):   24.2 ml 15.61 ml/m  RA Volume:   11.10 ml 7.16 ml/m LA Vol (A4C):   24.0 ml 15.49 ml/m LA Biplane Vol: 24.8 ml 16.00 ml/m  AORTIC VALVE                    PULMONIC VALVE AV Area (Vmax):    1.50 cm     PV Vmax:       0.87 m/s AV Area (Vmean):   1.55 cm     PV Peak grad:  3.0 mmHg AV Area (VTI):     1.54 cm AV Vmax:           143.00 cm/s AV Vmean:  94.300 cm/s AV VTI:            0.256 m AV Peak Grad:      8.2 mmHg AV Mean Grad:      4.0 mmHg LVOT Vmax:         84.20 cm/s LVOT Vmean:        57.300 cm/s LVOT VTI:          0.155 m LVOT/AV VTI ratio: 0.61  AORTA Ao Root diam: 2.30 cm Ao Asc diam:  2.60 cm MITRAL VALVE                TRICUSPID VALVE MV Area (PHT): 3.89 cm     TR Peak grad:   21.9 mmHg MV Area VTI:   1.37 cm     TR Vmax:        234.00 cm/s MV Peak grad:  3.6 mmHg MV Mean grad:  1.0 mmHg     SHUNTS MV Vmax:       0.95 m/s     Systemic VTI:  0.16 m MV Vmean:      56.4 cm/s    Systemic Diam: 1.80 cm MV Decel Time: 195 msec MV E velocity: 102.00 cm/s MV A velocity: 61.30 cm/s MV E/A ratio:  1.66 Vinie Maxcy MD Electronically signed by Vinie Maxcy MD Signature Date/Time: 10/28/2024/3:58:07 PM    Final    EEG adult Result Date: 10/28/2024 Shelton Arlin KIDD, MD     10/28/2024  2:17 PM Patient Name: Erin [name] MRN:  [id number] Epilepsy Attending: Arlin KIDD Shelton Referring Physician/Provider: Ilah Corean [name], PA-C Date: 10/28/2024 Duration: 24.28 mins Patient history: 68yo F with ams. EEG to evaluate for seizure Level of alertness: comatose/lethargic AEDs during EEG study: None Technical aspects: This EEG study was done with scalp electrodes positioned according to the 10-20 International system of electrode placement. Electrical activity was reviewed with band pass filter of 1-70Hz , sensitivity of 7 uV/mm, display speed of 50mm/sec with a 60Hz  notched filter applied as appropriate. EEG data were recorded continuously and digitally stored.  Video monitoring was available and reviewed as appropriate. Description: EEG showed continuous generalized polymorphic high amplitude 3 to 5 Hz theta-delta slowing. Hyperventilation and photic stimulation were not performed.   ABNORMALITY - Continuous slow, generalized IMPRESSION: This study is suggestive of generalized cerebral dysfunction ( encephalopathy). No seizures or epileptiform discharges were seen throughout the recording. Arlin KIDD Shelton    Vitals:   10/29/24 0800 10/29/24 0900 10/29/24 0938 10/29/24 1100  BP: (!) 186/120  (!) 148/54 (!) 164/46  Pulse: 93 70 76 72  Resp: 17 14 14 17   Temp:      TempSrc:      SpO2: 97% 97% 97% 97%  Weight:      Height:          Physical Exam  Constitutional: NAD  Psych: Sedated  Eyes: No scleral injection.  HENT: ETT in place Head: Normocephalic.  Cardiovascular: Normal rate and regular rhythm.  Respiratory: Mechanically ventilated  GI: Soft.  No distension. There is no tenderness.  Skin: WDI.   Neuro: Mental Status: Patient is unresponsive, no verbal output Following does not follow commands Cranial Nerves: II: PERRL - 4mm brisk  III,IV, VI: Eyes midline VII: Facial movement is obscured by ETT VIII: Hearing is intact to voice X: Cough and gag intact XI: Head is to the right  XII: Tongue protrudes midline  without atrophy or fasciculations.  Motor: Tone is normal. Bulk is normal.  Right arm with spontaneous movement more than left but no reaction to painful stimuli  No movement in bilateral lower extremities  Sensory: No response to painful stimuli  Toes upgoing  Cerebellar: Unable to perform    ASSESSMENT/PLAN  Erin [name] is a 68 y.o. female with history of coronary artery disease, hypertension, hyperlipidemia, squamous cell carcinoma of the anus-admitted to the ICU was in the hospital for management of hemorrhagic shock with concern for DIC.  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 These areas are highly suggestive of embolic infarcts MRI  Numerous scattered areas of restricted diffusion throughout the cerebral hemispheres and cerebellum concerning for acute infarcts of embolic etiology. MRA  2 mm inferiorly directed outpouching along the right supraclinoid ICA, most consistent with an infundibulum at the posterior communicating artery origin. A small aneurysm is less likely but not excluded. 2D Echo EF 60-65% 12/8- EEG- ABNORMALITY - Continuous slow, generalized IMPRESSION: This study is suggestive of generalized cerebral dysfunction ( encephalopathy). No seizures or epileptiform discharges were seen throughout the recording.  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 As needed fentanyl , oxycodone  for pain management CCS following  Hemorrhagic shock Hx Hypertension Home  meds:  Coreg , hydralazine , norvasc -on hold Stable BP goal normotensive (less than systolic 180, MAP goal 65) Episode of hypotension post op- found to have active arterial bleed 4U PRBCs, 1U FFP, 1U Cryo, IVF Off vasopressors Avoid low BP As needed labetalol  and hydralazine  for hypertension  Respiratory Failure Remain intubated postprocedure PCCM primary SBT/SAT  VAP protocol  Propofol  off 12/6; last fentanyl  bolus 12/9 at 1am Oxycodone  IR at 8am  Dysphagia Patient has post-stroke dysphagia, SLP consulted N.p.o. On tube feeding @50    Hospital day # 6   Patient seen and examined by NP/APP with MD. MD to update note as needed.   Jorene Last, DNP, FNP-BC [facility] Pager: [phone]  ATTENDING NOTE: I reviewed above note and agree with the assessment and plan. Pt was seen and examined.   Husband at bedside. Pt still intubated not on sedation, eyes closed but half way open with pain stimulation, not following commands. With forced eye opening, eyes in mid position, not blinking to visual threat, doll's eyes present, not tracking, PERRL. Corneal reflex present, gag and cough present. Breathing  over the vent.  Facial symmetry not able to test due to ET tube.  Tongue protrusion not cooperative. On pain stimulation, no significant movement of all extremities.  Bilateral babinski. Sensation, coordination and gait not tested.  For detailed assessment and plan, please refer to above as I have made changes wherever appropriate.   Ary Cummins, MD PhD Stroke Neurology 10/29/2024 5:59 PM  This patient is critically ill due to bilateral numerous cerebral and cerebellar infarcts, respiratory failure, severe anemia and hypovolemic shock and at significant risk of neurological worsening, death form recurrent stroke, hemorrhagic admission, sepsis, septic shock. This patient's care requires constant monitoring of vital signs, hemodynamics, respiratory and cardiac monitoring,  review of multiple databases, neurological assessment, discussion with family, other specialists  and medical decision making of high complexity. I spent 45 minutes of neurocritical care time in the care of this patient. I had long discussion with husband at bedside, updated pt current condition, treatment plan and potential prognosis, and answered all the questions.  He expressed understanding and appreciation.      To contact Stroke Continuity provider, please refer to [email or web address]. After hours, contact General Neurology

## 2024-10-30 DIAGNOSIS — N179 Acute kidney failure, unspecified: Secondary | ICD-10-CM | POA: Diagnosis not present

## 2024-10-30 DIAGNOSIS — J189 Pneumonia, unspecified organism: Secondary | ICD-10-CM

## 2024-10-30 DIAGNOSIS — Z8719 Personal history of other diseases of the digestive system: Secondary | ICD-10-CM | POA: Diagnosis not present

## 2024-10-30 DIAGNOSIS — D696 Thrombocytopenia, unspecified: Secondary | ICD-10-CM | POA: Diagnosis not present

## 2024-10-30 LAB — BASIC METABOLIC PANEL WITH GFR
Anion gap: 8 (ref 5–15)
BUN: 43 mg/dL — ABNORMAL HIGH (ref 8–23)
CO2: 27 mmol/L (ref 22–32)
Calcium: 7.2 mg/dL — ABNORMAL LOW (ref 8.9–10.3)
Chloride: 107 mmol/L (ref 98–111)
Creatinine, Ser: 2.26 mg/dL — ABNORMAL HIGH (ref 0.44–1.00)
GFR, Estimated: 23 mL/min — ABNORMAL LOW (ref >60)
Glucose, Bld: 141 mg/dL — ABNORMAL HIGH (ref 70–99)
Potassium: 3.2 mmol/L — ABNORMAL LOW (ref 3.5–5.1)
Sodium: 142 mmol/L (ref 135–145)

## 2024-10-30 LAB — CBC
HCT: 26.1 % — ABNORMAL LOW (ref 36.0–46.0)
Hemoglobin: 8.7 g/dL — ABNORMAL LOW (ref 12.0–15.0)
MCH: 30.6 pg (ref 26.0–34.0)
MCHC: 33.3 g/dL (ref 30.0–36.0)
MCV: 91.9 fL (ref 80.0–100.0)
Platelets: 151 K/uL (ref 150–400)
RBC: 2.84 MIL/uL — ABNORMAL LOW (ref 3.87–5.11)
RDW: 15.5 % (ref 11.5–15.5)
WBC: 11.9 K/uL — ABNORMAL HIGH (ref 4.0–10.5)
nRBC: 0 % (ref 0.0–0.2)

## 2024-10-30 LAB — GLUCOSE, CAPILLARY
Glucose-Capillary: 117 mg/dL — ABNORMAL HIGH (ref 70–99)
Glucose-Capillary: 124 mg/dL — ABNORMAL HIGH (ref 70–99)
Glucose-Capillary: 130 mg/dL — ABNORMAL HIGH (ref 70–99)
Glucose-Capillary: 130 mg/dL — ABNORMAL HIGH (ref 70–99)
Glucose-Capillary: 136 mg/dL — ABNORMAL HIGH (ref 70–99)
Glucose-Capillary: 143 mg/dL — ABNORMAL HIGH (ref 70–99)

## 2024-10-30 LAB — MAGNESIUM: Magnesium: 1.5 mg/dL — ABNORMAL LOW (ref 1.7–2.4)

## 2024-10-30 LAB — PHOSPHORUS: Phosphorus: 2.7 mg/dL (ref 2.5–4.6)

## 2024-10-30 MED ORDER — POTASSIUM CHLORIDE 20 MEQ PO PACK
40.0000 meq | PACK | ORAL | Status: AC
  Administered 2024-10-30 (×2): 40 meq
  Filled 2024-10-30 (×2): qty 2

## 2024-10-30 MED ORDER — MAGNESIUM SULFATE 2 GM/50ML IV SOLN
2.0000 g | Freq: Once | INTRAVENOUS | Status: AC
  Administered 2024-10-30: 2 g via INTRAVENOUS
  Filled 2024-10-30: qty 50

## 2024-10-30 MED ORDER — PIPERACILLIN-TAZOBACTAM IN DEX 2-0.25 GM/50ML IV SOLN
2.2500 g | Freq: Three times a day (TID) | INTRAVENOUS | Status: DC
  Administered 2024-10-30 – 2024-11-01 (×6): 2.25 g via INTRAVENOUS
  Filled 2024-10-30 (×7): qty 50

## 2024-10-30 MED ORDER — POTASSIUM & SODIUM PHOSPHATES 280-160-250 MG PO PACK
2.0000 | PACK | Freq: Once | ORAL | Status: AC
  Administered 2024-10-30: 2
  Filled 2024-10-30: qty 2

## 2024-10-30 MED ORDER — FUROSEMIDE 10 MG/ML IJ SOLN
40.0000 mg | Freq: Once | INTRAMUSCULAR | Status: AC
  Administered 2024-10-30: 40 mg via INTRAVENOUS
  Filled 2024-10-30: qty 4

## 2024-10-30 MED ORDER — GERHARDT'S BUTT CREAM
TOPICAL_CREAM | Freq: Two times a day (BID) | CUTANEOUS | Status: DC
  Administered 2024-11-02 – 2024-11-08 (×2): 1 via TOPICAL
  Filled 2024-10-30 (×4): qty 60

## 2024-10-30 NOTE — Progress Notes (Signed)
 5 Days Post-Op   Subjective/Chief Complaint: Pt with no acute event overnight    Objective: Vital signs in last 24 hours: Temp:  [98.9 F (37.2 C)-100.9 F (38.3 C)] 99.1 F (37.3 C) (12/10 0400) Pulse Rate:  [69-97] 75 (12/10 0600) Resp:  [14-29] 19 (12/10 0600) BP: (106-195)/(46-120) 148/67 (12/10 0600) SpO2:  [89 %-98 %] 96 % (12/10 0600) FiO2 (%):  [30 %] 30 % (12/10 0429) Weight:  [52 kg] 52 kg (12/10 0500) Last BM Date : 10/29/24  Intake/Output from previous day: 12/09 0701 - 12/10 0700 In: 3158.4 [NG/GT:2769.2; IV Piggyback:389.3] Out: 4500 [Urine:4450; Emesis/NG output:50] Intake/Output this shift: No intake/output data recorded.  General appearance: awakens Cardio: regular rate and rhythm, S1, S2 normal, no murmur, click, rub or gallop GI: soft, non-tender; bowel sounds normal; no masses,  no organomegaly and inc c/d/i Neurologic: Sensory: p[en eyes and grimaces to pain Incision/Wound: c/d/i  Lab Results:  Recent Labs    10/28/24 0450 10/29/24 0530  WBC 6.6 8.9  HGB 8.0* 8.6*  HCT 24.4* 26.5*  PLT 82* 116*   BMET Recent Labs    10/28/24 0450 10/29/24 0530  NA 144 142  K 3.3* 3.3*  CL 108 107  CO2 25 28  GLUCOSE 119* 137*  BUN 37* 41*  CREATININE 2.65* 2.43*  CALCIUM  7.7* 8.0*   PT/INR Recent Labs    10/29/24 0530  LABPROT 15.1  INR 1.1   ABG Recent Labs    10/27/24 1015  PHART 7.39  HCO3 26.0    Studies/Results: VAS US  CAROTID Result Date: 10/29/2024 Carotid Arterial Duplex Study Patient Name:  Erin [name]  Date of Exam:   10/29/2024 Medical Rec #: [id number]          Accession #:    [id number] Date of Birth: 30-Aug-1956          Patient Gender: F Patient Age:   68 years Exam Location:  [facility] Procedure:      VAS US  CAROTID Referring Phys: ELIGIO LAV --------------------------------------------------------------------------------  Indications:   CVA. Risk Factors:  Hypertension, hyperlipidemia, coronary artery  disease, PAD. Other Factors: Status post hernia repair surgery on 10/23/24                S/P aortobifem bypass, instent SMA and Celiac. Limitations    Today's exam was limited due to Left neck cathether/tape. Performing Technologist: Erin [name] RDMS, RVT  Examination Guidelines: A complete evaluation includes B-mode imaging, spectral Doppler, color Doppler, and power Doppler as needed of all accessible portions of each vessel. Bilateral testing is considered an integral part of a complete examination. Limited examinations for reoccurring indications may be performed as noted.  Right Carotid Findings: +----------+--------+--------+--------+------------------+--------+           PSV cm/sEDV cm/sStenosisPlaque DescriptionComments +----------+--------+--------+--------+------------------+--------+ CCA Prox  118     18                                         +----------+--------+--------+--------+------------------+--------+ CCA Distal100     16                                         +----------+--------+--------+--------+------------------+--------+ ICA Prox  179     41      40-59%  heterogenous               +----------+--------+--------+--------+------------------+--------+  ICA Mid   161     32      1-39%                              +----------+--------+--------+--------+------------------+--------+ ICA Distal112     28                                         +----------+--------+--------+--------+------------------+--------+ ECA       139                                                +----------+--------+--------+--------+------------------+--------+ +----------+--------+-------+----------------+-------------------+           PSV cm/sEDV cmsDescribe        Arm Pressure (mmHG) +----------+--------+-------+----------------+-------------------+ Subclavian172            Multiphasic, WNL                     +----------+--------+-------+----------------+-------------------+ +---------+--------+---+--------+--+---------+ VertebralPSV cm/s106EDV cm/s26Antegrade +---------+--------+---+--------+--+---------+  Left Carotid Findings: +----------+--------+--------+--------+------------------+--------+           PSV cm/sEDV cm/sStenosisPlaque DescriptionComments +----------+--------+--------+--------+------------------+--------+ CCA Prox  84      17                                         +----------+--------+--------+--------+------------------+--------+ CCA Distal103     17                                         +----------+--------+--------+--------+------------------+--------+ ICA Prox  114     19      1-39%                              +----------+--------+--------+--------+------------------+--------+ ICA Distal86      23                                         +----------+--------+--------+--------+------------------+--------+ ECA       105                                                +----------+--------+--------+--------+------------------+--------+ +----------+--------+--------+--------+-------------------+           PSV cm/sEDV cm/sDescribeArm Pressure (mmHG) +----------+--------+--------+--------+-------------------+ Subclavian260                                         +----------+--------+--------+--------+-------------------+ +---------+--------+--+--------+--+ VertebralPSV cm/s95EDV cm/s19 +---------+--------+--+--------+--+   Summary: Right Carotid: Velocities in the right ICA are consistent with a 40-59%                stenosis. Left Carotid: Velocities in the left ICA are consistent with a 1-39% stenosis. Vertebrals:  Bilateral vertebral arteries demonstrate antegrade flow.  Subclavians: Normal flow hemodynamics were seen in bilateral subclavian              arteries. *See table(s) above for measurements and observations.     Preliminary     MR ANGIO HEAD WO CONTRAST Result Date: 10/28/2024 EXAM: MR Angiography Head without intravenous Contrast. 10/28/2024 05:58:31 PM TECHNIQUE: Magnetic resonance angiography images of the head without intravenous contrast. Multiplanar 2D and 3D reformatted images are provided for review. COMPARISON: Same day CT head and MRI head. CLINICAL HISTORY: Stroke, follow up. FINDINGS: ANTERIOR CIRCULATION: The visualized distal cervical internal carotid arteries and intracranial internal carotid arteries are patent bilaterally. There is a 2 mm inferiorly directed outpouching along the right supraclinoid ICA which likely reflects an infundibulum at the origin of the posterior communicating artery versus a small aneurysm. No significant stenosis of the anterior cerebral arteries. No significant stenosis of the middle cerebral arteries. No other aneurysm is identified in the anterior circulation. POSTERIOR CIRCULATION: No significant stenosis of the posterior cerebral arteries. No significant stenosis of the basilar artery. No significant stenosis of the vertebral arteries. No aneurysm. IMPRESSION: 1. No large vessel occlusion. No significant intracranial arterial stenosis. 2. 2 mm inferiorly directed outpouching along the right supraclinoid ICA, most consistent with an infundibulum at the posterior communicating artery origin. A small aneurysm is less likely but not excluded. Electronically signed by: Donnice Mania MD 10/28/2024 06:40 PM EST RP Workstation: [id number]   MR BRAIN WO CONTRAST Result Date: 10/28/2024 EXAM: MRI BRAIN WITHOUT CONTRAST 10/28/2024 05:58:31 PM TECHNIQUE: Multiplanar multisequence MRI of the head/brain was performed without the administration of intravenous contrast. COMPARISON: Same day CT head. CLINICAL HISTORY: Stroke, follow up. FINDINGS: BRAIN AND VENTRICLES: Numerous scattered areas of restricted diffusion throughout the cerebral hemispheres and cerebellum concerning for infarcts. Dominant  lesions noted within the inferior left cerebellum on series 5 image 9 as well as within the inferior aspects of the temporal lobes and right occipital lobe on series 5 image 20. There are lesions throughout multiple vascular territories bilaterally. Associated edema particularly within the right occipital lobe and left temporal lobe with local mass effect and sulcal effacement. There are few scattered areas of susceptibility and T1 hyperintensity which could reflect petechial hemorrhage. Cerebral volume within normal limits for patients age. No hydrocephalus. The sella is unremarkable. Normal flow voids. ORBITS: No acute abnormality. SINUSES AND MASTOIDS: Trace bilateral mastoid effusions. Scattered mucosal thickening in the ethmoid and sphenoid sinuses. BONES AND SOFT TISSUES: Normal marrow signal. Partially visualized enteric tube. No acute soft tissue abnormality. IMPRESSION: 1. 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 2. Associated mild edema, particularly within the right occipital lobe and left temporal lobe. No midline shift. 3. Few scattered areas of susceptibility and T1 hyperintensity, possibly reflecting petechial hemorrhage. Electronically signed by: Donnice Mania MD 10/28/2024 06:22 PM EST RP Workstation: [id number]   ECHOCARDIOGRAM COMPLETE Result Date: 10/28/2024    ECHOCARDIOGRAM REPORT   Patient Name:   Erin [name] Date of Exam: 10/28/2024 Medical Rec #:  [id number]         Height:       63.0 in Accession #:    [id number]        Weight:       118.8 lb Date of Birth:  Jun 25, 1956         BSA:          1.550 m Patient Age:    68 years  BP:           106/37 mmHg Patient Gender: F                 HR:           60 bpm. Exam Location:  Inpatient Procedure: 2D Echo (Both Spectral and Color Flow Doppler were utilized during            procedure). Indications:    Stroke  History:         Patient has prior history of Echocardiogram examinations.                 Stroke.  Sonographer:    Norleen Amour Referring Phys: [id number] ASHISH ARORA IMPRESSIONS  1. Left ventricular ejection fraction, by estimation, is 60 to 65%. Left ventricular ejection fraction by 2D MOD biplane is 64.6 %. The left ventricle has normal function. The left ventricle has no regional wall motion abnormalities. Left ventricular diastolic parameters were normal.  2. Right ventricular systolic function is normal. The right ventricular size is normal. There is normal pulmonary artery systolic pressure. The estimated right ventricular systolic pressure is 24.9 mmHg.  3. The mitral valve was not well visualized. No evidence of mitral valve regurgitation.  4. The aortic valve was not well visualized. Aortic valve regurgitation is not visualized.  5. The inferior vena cava is normal in size with greater than 50% respiratory variability, suggesting right atrial pressure of 3 mmHg. Comparison(s): No prior Echocardiogram. FINDINGS  Left Ventricle: Left ventricular ejection fraction, by estimation, is 60 to 65%. Left ventricular ejection fraction by 2D MOD biplane is 64.6 %. The left ventricle has normal function. The left ventricle has no regional wall motion abnormalities. The left ventricular internal cavity size was normal in size. There is no left ventricular hypertrophy. Left ventricular diastolic parameters were normal. Right Ventricle: The right ventricular size is normal. No increase in right ventricular wall thickness. Right ventricular systolic function is normal. There is normal pulmonary artery systolic pressure. The tricuspid regurgitant velocity is 2.34 m/s, and  with an assumed right atrial pressure of 3 mmHg, the estimated right ventricular systolic pressure is 24.9 mmHg. Left Atrium: Left atrial size was normal in size. Right Atrium: Right atrial size was normal in size. Pericardium: There is no evidence of pericardial  effusion. Mitral Valve: The mitral valve was not well visualized. No evidence of mitral valve regurgitation. MV peak gradient, 3.6 mmHg. The mean mitral valve gradient is 1.0 mmHg. Tricuspid Valve: The tricuspid valve is grossly normal. Tricuspid valve regurgitation is trivial. Aortic Valve: The aortic valve was not well visualized. Aortic valve regurgitation is not visualized. Aortic valve mean gradient measures 4.0 mmHg. Aortic valve peak gradient measures 8.2 mmHg. Aortic valve area, by VTI measures 1.54 cm. Pulmonic Valve: The pulmonic valve was normal in structure. Pulmonic valve regurgitation is not visualized. Aorta: The aortic root and ascending aorta are structurally normal, with no evidence of dilitation. Venous: The inferior vena cava is normal in size with greater than 50% respiratory variability, suggesting right atrial pressure of 3 mmHg. IAS/Shunts: No atrial level shunt detected by color flow Doppler.  LEFT VENTRICLE PLAX 2D                        Biplane EF (MOD) LVIDd:         4.30 cm         LV Biplane EF:   Left LVIDs:  2.50 cm                          ventricular LV PW:         0.80 cm                          ejection LV IVS:        0.70 cm                          fraction by LVOT diam:     1.80 cm                          2D MOD LV SV:         39                               biplane is LV SV Index:   25                               64.6 %. LVOT Area:     2.54 cm                                Diastology                                LV e' medial:    7.18 cm/s LV Volumes (MOD)               LV E/e' medial:  14.2 LV vol d, MOD    40.2 ml       LV e' lateral:   9.14 cm/s A2C:                           LV E/e' lateral: 11.2 LV vol d, MOD    47.1 ml A4C: LV vol s, MOD    11.8 ml A2C: LV vol s, MOD    21.0 ml A4C: LV SV MOD A2C:   28.4 ml LV SV MOD A4C:   47.1 ml LV SV MOD BP:    28.9 ml RIGHT VENTRICLE             IVC RV S prime:     11.00 cm/s  IVC diam: 1.20 cm TAPSE (M-mode): 2.2 cm                              PULMONARY VEINS                             Diastolic Velocity: 30.80 cm/s                             S/D Velocity:       1.00                             Systolic Velocity:  30.80 cm/s LEFT ATRIUM  Index        RIGHT ATRIUM          Index LA diam:        2.50 cm 1.61 cm/m   RA Area:     6.54 cm LA Vol (A2C):   24.2 ml 15.61 ml/m  RA Volume:   11.10 ml 7.16 ml/m LA Vol (A4C):   24.0 ml 15.49 ml/m LA Biplane Vol: 24.8 ml 16.00 ml/m  AORTIC VALVE                    PULMONIC VALVE AV Area (Vmax):    1.50 cm     PV Vmax:       0.87 m/s AV Area (Vmean):   1.55 cm     PV Peak grad:  3.0 mmHg AV Area (VTI):     1.54 cm AV Vmax:           143.00 cm/s AV Vmean:          94.300 cm/s AV VTI:            0.256 m AV Peak Grad:      8.2 mmHg AV Mean Grad:      4.0 mmHg LVOT Vmax:         84.20 cm/s LVOT Vmean:        57.300 cm/s LVOT VTI:          0.155 m LVOT/AV VTI ratio: 0.61  AORTA Ao Root diam: 2.30 cm Ao Asc diam:  2.60 cm MITRAL VALVE                TRICUSPID VALVE MV Area (PHT): 3.89 cm     TR Peak grad:   21.9 mmHg MV Area VTI:   1.37 cm     TR Vmax:        234.00 cm/s MV Peak grad:  3.6 mmHg MV Mean grad:  1.0 mmHg     SHUNTS MV Vmax:       0.95 m/s     Systemic VTI:  0.16 m MV Vmean:      56.4 cm/s    Systemic Diam: 1.80 cm MV Decel Time: 195 msec MV E velocity: 102.00 cm/s MV A velocity: 61.30 cm/s MV E/A ratio:  1.66 Erin Maxcy MD Electronically signed by Erin Maxcy MD Signature Date/Time: 10/28/2024/3:58:07 PM    Final    EEG adult Result Date: 10/28/2024 [name] Arlin KIDD, MD     10/28/2024  2:17 PM Patient Name: Erin [name] MRN: [id number] Epilepsy Attending: Arlin [name] [name] Referring Physician/Provider: Ilah Corean [name], PA-C Date: 10/28/2024 Duration: 24.28 mins Patient history: 68yo F with ams. EEG to evaluate for seizure Level of alertness: comatose/lethargic AEDs during EEG study: None Technical aspects: This EEG study was done with scalp  electrodes positioned according to the 10-20 International system of electrode placement. Electrical activity was reviewed with band pass filter of 1-70Hz , sensitivity of 7 uV/mm, display speed of 47mm/sec with a 60Hz  notched filter applied as appropriate. EEG data were recorded continuously and digitally stored.  Video monitoring was available and reviewed as appropriate. Description: EEG showed continuous generalized polymorphic high amplitude 3 to 5 Hz theta-delta slowing. Hyperventilation and photic stimulation were not performed.   ABNORMALITY - Continuous slow, generalized IMPRESSION: This study is suggestive of generalized cerebral dysfunction ( encephalopathy). No seizures or epileptiform discharges were seen throughout the recording. Erin [name]   CT HEAD WO CONTRAST ( ) Result Date: 10/28/2024 EXAM:  CT HEAD WITHOUT CONTRAST 10/28/2024 10:26:05 AM TECHNIQUE: CT of the head was performed without the administration of intravenous contrast. Automated exposure control, iterative reconstruction, and/or weight based adjustment of the mA/kV was utilized to reduce the radiation dose to as low as reasonably achievable. COMPARISON: 05/03/2024 CLINICAL HISTORY: Mental status change, unknown cause FINDINGS: BRAIN AND VENTRICLES: No acute hemorrhage. No evidence of acute infarct. No hydrocephalus. No extra-axial collection. No mass effect or midline shift. Interval development of multiple cortical and subcortical edema areas involving bilateral cerebral and cerebellar hemispheres. ORBITS: No acute abnormality. SINUSES: Left frontal sinus mucosal thickening. SOFT TISSUES AND SKULL: No acute soft tissue abnormality. No skull fracture. Partially visualized nasogastric tube in place. IMPRESSION: 1. Interval development of multiple cortical and subcortical edema areas involving bilateral cerebral and cerebellar hemispheres. These areas are highly suggestive of embolic infarcts. Correlation with MRI of the brain  without and with gadolinium contrast is suggested. Electronically signed by: Evalene Coho MD 10/28/2024 10:56 AM EST RP Workstation: [id number]    Anti-infectives: Anti-infectives (From admission, onward)    Start     Dose/Rate Route Frequency Ordered Stop   10/28/24 1600  ceFEPIme  (MAXIPIME ) 2 g in sodium chloride  0.9 % 100 mL IVPB        2 g 200 mL/hr over 30 Minutes Intravenous Every 24 hours 10/28/24 1441     10/26/24 1800  cefTRIAXone  (ROCEPHIN ) 2 g in sodium chloride  0.9 % 100 mL IVPB  Status:  Discontinued        2 g 200 mL/hr over 30 Minutes Intravenous Daily-1800 10/26/24 1739 10/28/24 1441   10/23/24 0700  ceFAZolin  (ANCEF ) IVPB 2g/100 mL premix        2 g 200 mL/hr over 30 Minutes Intravenous On call to O.R. 10/23/24 9351 10/23/24 9166       Assessment/Plan: s/p Procedure(s) with comments: LAPAROTOMY, EXPLORATORY (N/A) - REMOVAL OF PACKING POD 6 from lap VHR and reexploration for bleeding, POD 4 from reexploration and abdominal closure -AKI appears to be improving slowly, appreciate Nephro assistance -Appreciate CCM assistance.  Breathing over the vent.  Possible need for trach/feeding tube in the near future - TF @ goal -On ASA for CVA, appreciate Neuro assistance   LOS: 7 days    Lynda Leos 10/30/2024

## 2024-10-30 NOTE — Progress Notes (Signed)
 SLP Cancellation Note  Patient Details Name: Erin [name] MRN: [id number] DOB: [birth year]   Cancelled treatment:       Reason Eval/Treat Not Completed: Medical issues which prohibited therapy (Remains on vent. Will f/u next date.)  Rea Pass MA, CCC-SLP  [name] Meryl 10/30/2024, 11:16 AM

## 2024-10-30 NOTE — Evaluation (Addendum)
 Physical Therapy Evaluation Patient Details Name: Erin [name] MRN: [id number] DOB: Feb 10, 1956 Today's Date: 10/30/2024  History of Present Illness  68 y.o. female presents to [facility] hospital on 10/29/2024 as a transfer from [place]. Pt underwent ventral hernia repair on 12/3. Pt required return to OR on 12/4 for ex-lap with finding of abdominal hemorrhage, remaining intubated post-procedure. OR 12/5 for removal of packing and abdominal closure. MRI on 12/8 concerning for multiple embolic infarcts. PMH includes anal cancer, hypothyroidism, GERD, CAD, cardiomyopathy, PVD, HLD, HTN, RLS.  Clinical Impression  Pt presents to PT with deficits in functional mobility, strength, power, ROM. Pt remains intubated but tolerates bed mobility well on vent, with stable vitals. Pt does not follow commands during session. PT notes intermittent BUE spasticity into decorticate positioning, no spasticity of increased tone noted in lower extremities. Pt with a tendency to rest head in R cervical rotation toward ventilator, unable to determine if the pt is neglectful of L side/environment at this time due to reduced arousal and lack of command following. PT will continue to follow in an effort to further assess mobility and to better determine post-acute PT and DME needs.         If plan is discharge home, recommend the following: Two people to help with walking and/or transfers;Two people to help with bathing/dressing/bathroom;Assistance with cooking/housework;Assistance with feeding;Direct supervision/assist for medications management;Direct supervision/assist for financial management;Assist for transportation;Help with stairs or ramp for entrance;Supervision due to cognitive status   Can travel by private vehicle        Equipment Recommendations [place] lift;Hospital bed  Recommendations for Other Services       Functional Status Assessment Patient has had a recent decline in their functional status and  demonstrates the ability to make significant improvements in function in a reasonable and predictable amount of time.     Precautions / Restrictions Precautions Precautions: Fall;Other (comment) Recall of Precautions/Restrictions: Impaired Precaution/Restrictions Comments: ETT, NG tube Restrictions Weight Bearing Restrictions Per Provider Order: No      Mobility  Bed Mobility Overal bed mobility: Needs Assistance Bed Mobility: Supine to Sit, Sit to Supine     Supine to sit: Total assist, +2 for physical assistance Sit to supine: Total assist, +2 for physical assistance        Transfers                        Ambulation/Gait                  Stairs            Wheelchair Mobility     Tilt Bed    Modified Rankin (Stroke Patients Only) Pre-morbid ranking: 0 Modified Rankin: 5      Balance Overall balance assessment: Needs assistance Sitting-balance support: No upper extremity supported, Feet unsupported Sitting balance-Leahy Scale: Zero Sitting balance - Comments: totalA Postural control: Posterior lean                                   Pertinent Vitals/Pain Pain Assessment Pain Assessment: CPOT Facial Expression: Tense (intermittent decorticate spasticity noted in BUE) Body Movements: Absence of movements Muscle Tension: Tense, rigid Compliance with ventilator (intubated pts.): Coughing but tolerating Vocalization (extubated pts.): N/A CPOT Total: 3 Pain Intervention(s): Limited activity within patient's tolerance    Home Living Family/patient expects to be discharged to:: Private residence Living Arrangements: Spouse/significant other  Available Help at Discharge: Family;Available 24 hours/day Type of Home: House Home Access: Stairs to enter   [employer] of Steps: 1   Home Layout: One level Home Equipment: Grab bars - tub/shower;Grab bars - toilet;Hand held shower head;Shower seat - built  Mag[employer] Features Editor (2 wheels);Cane - single point;Wheelchair - manual Additional Comments: 1 cat and 1 dog at home    Prior Function Prior Level of Function : Independent/Modified Independent;Driving             Mobility Comments: no AD, tolerance for community mobility limited as times per spouse ADLs Comments: retired, ind ADLs, IADLS, meds; spouse completes grocery shopping     Extremity/Trunk Assessment   Upper Extremity Assessment Upper Extremity Assessment: RUE deficits/detail;LUE deficits/detail RUE Deficits / Details: pt with intermittent decorticate spasticity of BUE. Full PROM available at digits, wrist, elbow. Passive R shoulder flexion to ~100 degrees on eval however ventilator tubing limiting full assessment. LUE Deficits / Details: pt with intermittent decorticate spasticity of BUE. Full PROM available at digits, wrist, elbow. Passive L shoulder flexion to ~80 degrees on eval with tone noted limiting ROM    Lower Extremity Assessment Lower Extremity Assessment: RLE deficits/detail;LLE deficits/detail RLE Deficits / Details: no AROM noted, PROM WFL, no response to painful stimuli LLE Deficits / Details: no AROM noted, PROM WFL, no response to painful stimuli    Cervical / Trunk Assessment Cervical / Trunk Assessment: Normal (head tending to rest in R cervical rotation toward ventilator. Pt does turn head to midline but not past midline today. Poor head control in sitting)  Communication   Communication Communication: Impaired Factors Affecting Communication: Trach/intubated    Cognition Arousal: Lethargic (intermittently opens eyes in supine, seldomly opens in sitting) Behavior During Therapy: Flat affect   PT - Cognitive impairments: Difficult to assess Difficult to assess due to: Impaired communication, Intubated                     PT - Cognition Comments: pt does not follow commands, pt does not consistently open eyes with verbal or tactile  stimulation. Pt does not follow any commands currently. Following commands: Impaired Following commands impaired:  (no command following noted)     Cueing Cueing Techniques: Tactile cues, Visual cues, Gestural cues, Verbal cues     General Comments General comments (skin integrity, edema, etc.): intubated, pt on pressure support 30% FiO2 5 PEEP. HR and SpO2 stable during session. BP stable at 166/62.    Exercises     Assessment/Plan    PT Assessment Patient needs continued PT services  PT Problem List Decreased strength;Decreased activity tolerance;Decreased range of motion;Decreased balance;Decreased mobility;Decreased cognition;Decreased knowledge of use of DME;Decreased safety awareness;Decreased knowledge of precautions;Impaired tone;Impaired sensation       PT Treatment Interventions DME instruction;Gait training;Stair training;Functional mobility training;Therapeutic activities;Therapeutic exercise;Neuromuscular re-education;Balance training;Patient/family education;Wheelchair mobility training;Manual techniques    PT Goals (Current goals can be found in the Care Plan section)  Acute Rehab PT Goals Patient Stated Goal: to improve mobility quality and reduce caregiver burden PT Goal Formulation: With family Time For Goal Achievement: 11/13/24 Potential to Achieve Goals: Poor Additional Goals Additional Goal #1: Pt will follow >50% of simple one-step commands to demonstrate improved ability to participate in PT treatment.    Frequency Min 2X/week     Co-evaluation PT/OT/SLP Co-Evaluation/Treatment: Yes Reason for Co-Treatment: Complexity of the patient's impairments (multi-system involvement);Necessary to address cognition/behavior during functional activity;For patient/therapist safety PT goals addressed during session: Mobility/safety with mobility;Balance;Strengthening/ROM  AM-PAC PT 6 Clicks Mobility  Outcome Measure Help needed turning from your back to  your side while in a flat bed without using bedrails?: Total Help needed moving from lying on your back to sitting on the side of a flat bed without using bedrails?: Total Help needed moving to and from a bed to a chair (including a wheelchair)?: Total Help needed standing up from a chair using your arms (e.g., wheelchair or bedside chair)?: Total Help needed to walk in hospital room?: Total Help needed climbing 3-5 steps with a railing? : Total 6 Click Score: 6    End of Session Equipment Utilized During Treatment: Oxygen Activity Tolerance: Patient limited by lethargy;Treatment limited secondary to medical complications (Comment) (intubated) Patient left: in bed;with call bell/phone within reach;with bed alarm set;with family/visitor present Nurse Communication: Mobility status;Need for lift equipment PT Visit Diagnosis: Other abnormalities of gait and mobility (R26.89);Muscle weakness (generalized) (M62.81);Other symptoms and signs involving the nervous system (R29.898)    Time: 8996-8971 PT Time Calculation (min) (ACUTE ONLY): 25 min   Charges:   PT Evaluation $PT Eval High Complexity: 1 High   PT General Charges $$ ACUTE PT VISIT: 1 Visit         Bernardino JINNY Ruth, PT, DPT Acute Rehabilitation Office [phone]   Bernardino JINNY Ruth 10/30/2024, 10:46 AM

## 2024-10-30 NOTE — Progress Notes (Addendum)
 STROKE TEAM PROGRESS NOTE    SIGNIFICANT HOSPITAL EVENTS 12/3 - hernia repair 12/4 taken back to the OR for hemoperitoneum-active bleed was not identified, abdomen packed with wound VAC in place 12/5 back to the OR for an open abdomen-no evidence of ongoing bleeding and abdomen was closed 12/8 - CT Head and MRI brain with bil strokes   INTERIM HISTORY/SUBJECTIVE Patient is seen in her room with 2 family members at the bedside.  She has been hemodynamically stable overnight.  She will open her eyes to voice but does not follow commands or respond to noxious stimuli.  OBJECTIVE  CBC    Component Value Date/Time   WBC 8.9 10/29/2024 0530   RBC 2.82 (L) 10/29/2024 0530   HGB 8.6 (L) 10/29/2024 0530   HGB 11.9 11/08/2019 1403   HGB 10.6 (L) 11/28/2013 1148   HCT 26.5 (L) 10/29/2024 0530   HCT 35.7 11/08/2019 1403   HCT 31.2 (L) 11/28/2013 1148   PLT 116 (L) 10/29/2024 0530   PLT 250 11/08/2019 1403   MCV 94.0 10/29/2024 0530   MCV 99 (H) 11/08/2019 1403   MCV 101.4 (H) 11/28/2013 1148   MCH 30.5 10/29/2024 0530   MCHC 32.5 10/29/2024 0530   RDW 15.1 10/29/2024 0530   RDW 11.8 11/08/2019 1403   RDW 15.4 (H) 11/28/2013 1148   LYMPHSABS 0.4 (L) 10/29/2024 0530   LYMPHSABS 1.1 11/08/2019 1403   LYMPHSABS 0.8 (L) 11/28/2013 1148   MONOABS 0.8 10/29/2024 0530   MONOABS 0.9 11/28/2013 1148   EOSABS 0.1 10/29/2024 0530   EOSABS 0.0 11/08/2019 1403   BASOSABS 0.0 10/29/2024 0530   BASOSABS 0.0 11/08/2019 1403   BASOSABS 0.0 11/28/2013 1148    BMET    Component Value Date/Time   NA 142 10/29/2024 0530   NA 137 07/01/2024 1505   NA 139 04/22/2014 1028   K 3.3 (L) 10/29/2024 0530   K 4.1 04/22/2014 1028   CL 107 10/29/2024 0530   CO2 28 10/29/2024 0530   CO2 23 04/22/2014 1028   GLUCOSE 137 (H) 10/29/2024 0530   GLUCOSE 81 04/22/2014 1028   BUN 41 (H) 10/29/2024 0530   BUN 20 07/01/2024 1505   BUN 8.8 04/22/2014 1028   CREATININE 2.43 (H) 10/29/2024 0530   CREATININE  0.94 12/10/2021 1532   CREATININE 0.8 04/22/2014 1028   CALCIUM  8.0 (L) 10/29/2024 0530   CALCIUM  9.1 04/22/2014 1028   EGFR 38 (L) 07/01/2024 1505   GFRNONAA 21 (L) 10/29/2024 0530    IMAGING past 24 hours VAS US  CAROTID Result Date: 10/29/2024 Carotid Arterial Duplex Study Patient Name:  Erin [name]  Date of Exam:   10/29/2024 Medical Rec #: [id number]          Accession #:    [id number] Date of Birth: [birth year]          Patient Gender: F Patient Age:   69 years Exam Location:  [facility] Procedure:      VAS US  CAROTID Referring Phys: ELIGIO LAV --------------------------------------------------------------------------------  Indications:   CVA. Risk Factors:  Hypertension, hyperlipidemia, coronary artery disease, PAD. Other Factors: Status post hernia repair surgery on 10/23/24                S/P aortobifem bypass, instent SMA and Celiac. Limitations    Today's exam was limited due to Left neck cathether/tape. Performing Technologist: Ricka Sturdivant-Jones RDMS, RVT  Examination Guidelines: A complete evaluation includes B-mode imaging, spectral Doppler, color Doppler, and power Doppler  as needed of all accessible portions of each vessel. Bilateral testing is considered an integral part of a complete examination. Limited examinations for reoccurring indications may be performed as noted.  Right Carotid Findings: +----------+--------+--------+--------+------------------+--------+           PSV cm/sEDV cm/sStenosisPlaque DescriptionComments +----------+--------+--------+--------+------------------+--------+ CCA Prox  118     18                                         +----------+--------+--------+--------+------------------+--------+ CCA Distal100     16                                         +----------+--------+--------+--------+------------------+--------+ ICA Prox  179     41      40-59%  heterogenous                +----------+--------+--------+--------+------------------+--------+ ICA Mid   161     32      1-39%                              +----------+--------+--------+--------+------------------+--------+ ICA Distal112     28                                         +----------+--------+--------+--------+------------------+--------+ ECA       139                                                +----------+--------+--------+--------+------------------+--------+ +----------+--------+-------+----------------+-------------------+           PSV cm/sEDV cmsDescribe        Arm Pressure (mmHG) +----------+--------+-------+----------------+-------------------+ Subclavian172            Multiphasic, WNL                    +----------+--------+-------+----------------+-------------------+ +---------+--------+---+--------+--+---------+ VertebralPSV cm/s106EDV cm/s26Antegrade +---------+--------+---+--------+--+---------+  Left Carotid Findings: +----------+--------+--------+--------+------------------+--------+           PSV cm/sEDV cm/sStenosisPlaque DescriptionComments +----------+--------+--------+--------+------------------+--------+ CCA Prox  84      17                                         +----------+--------+--------+--------+------------------+--------+ CCA Distal103     17                                         +----------+--------+--------+--------+------------------+--------+ ICA Prox  114     19      1-39%                              +----------+--------+--------+--------+------------------+--------+ ICA Distal86      23                                         +----------+--------+--------+--------+------------------+--------+  ECA       105                                                +----------+--------+--------+--------+------------------+--------+ +----------+--------+--------+--------+-------------------+           PSV cm/sEDV  cm/sDescribeArm Pressure (mmHG) +----------+--------+--------+--------+-------------------+ Subclavian260                                         +----------+--------+--------+--------+-------------------+ +---------+--------+--+--------+--+ VertebralPSV cm/s95EDV cm/s19 +---------+--------+--+--------+--+   Summary: Right Carotid: Velocities in the right ICA are consistent with a 40-59%                stenosis. Left Carotid: Velocities in the left ICA are consistent with a 1-39% stenosis. Vertebrals:  Bilateral vertebral arteries demonstrate antegrade flow. Subclavians: Normal flow hemodynamics were seen in bilateral subclavian              arteries. *See table(s) above for measurements and observations.     Preliminary     Vitals:   10/30/24 0600 10/30/24 0728 10/30/24 0759 10/30/24 1034  BP: (!) 148/67   (!) 169/60  Pulse: 75 70    Resp: 19 18    Temp:   99.5 F (37.5 C)   TempSrc:   Axillary   SpO2: 96% 97%    Weight:      Height:          Physical Exam  Constitutional: NAD  Eyes: No scleral injection.  HENT: ETT in place Head: Normocephalic.  Cardiovascular: Normal rate and regular rhythm.  Respiratory: Mechanically ventilated, respirations synchronous with ventilator Skin: WDI.   Neuro (on no sedation): Mental Status: Patient is unresponsive, no verbal output Following does not follow commands Cranial Nerves: II: PERRL - 3mm brisk, corneal reflexes intact III,IV, VI: Eyes midline, oculocephalic reflex intact VII: Facial movement is obscured by ETT X: Cough  intact Motor: Tone is normal. Bulk is normal.  Right arm with spontaneous movement more than left but no reaction to painful stimuli  No movement in bilateral lower extremities  Sensory: No response to painful stimuli  Cerebellar: Unable to perform    ASSESSMENT/PLAN  [name] is a 68 y.o. female with history of coronary artery disease, hypertension, hyperlipidemia, squamous cell  carcinoma of the anus-admitted to the ICU was in the hospital for management of hemorrhagic shock with concern for DIC.  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 These areas are highly suggestive of embolic infarcts MRI  Numerous scattered areas of restricted diffusion throughout the cerebral hemispheres and cerebellum concerning for acute infarcts of embolic etiology. MRA  2 mm inferiorly directed outpouching along the right supraclinoid ICA, most consistent with an infundibulum at the posterior communicating artery origin. A small aneurysm is less likely but not excluded. 2D Echo EF 60-65% 12/8- EEG- ABNORMALITY - Continuous slow, generalized IMPRESSION: This study is suggestive of generalized cerebral  dysfunction ( encephalopathy). No seizures or epileptiform discharges were seen throughout the recording.  LDL 13 HgbA1c 4.9 VTE prophylaxis - Heparin  subcu No antithrombotic prior to admission, now on aspirin  81 mg daily  Therapy recommendations:  LTAC Disposition: Pending  S/p hernia repair 12/3 S/p ex lap 124, 12/5 S/p mesenteric artery stenting 10/14 As needed fentanyl , oxycodone  for pain management CCS following  Hemorrhagic shock Hx Hypertension Home meds:  Coreg , hydralazine , norvasc -on hold Stable BP goal normotensive (less than systolic 180, MAP goal 65) Episode of hypotension post op- found to have active arterial bleed 4U PRBCs, 1U FFP, 1U Cryo, IVF Off vasopressors Avoid low BP As needed labetalol  and hydralazine  for hypertension  Respiratory  Failure Remain intubated postprocedure PCCM primary SBT/SAT  VAP protocol  Propofol  off 12/6; last fentanyl  bolus 12/10 at 0332 Oxycodone  IR at 8am  Dysphagia Patient has post-stroke dysphagia, SLP consulted N.p.o. On tube feeding @50   Other stroke risk factors Advanced age  Other medical issues AKI, Cre 2.88--2.65--2.43--2.26, nephro on board Hypokalemia, supplement  Hospital day # 7   Patient seen and examined by NP/APP with MD. MD to update note as needed.   Cortney E Everitt Clint Kill , MSN, AGACNP-BC [facility] See Amion for schedule and pager information 10/30/2024 11:31 AM   ATTENDING NOTE: I reviewed above note and agree with the assessment and plan. Pt was seen and examined.   Husband at the bedside and palliative care on board. Pt still intubated, eyes open on voice and able to keep opening for prolonged time. More awake alert than yesterday. Seems able to make some spontaenous movement of LUE and RLE, but still largely quadriplegia. Brainstem reflex intact but not following commands or active tracking. On ASA. Vitals stable. Palliative care on board for GOC discussion.   For detailed assessment and plan, please refer to above as I have made changes wherever appropriate.   Ary Cummins, MD PhD Stroke Neurology 10/30/2024 5:24 PM  This patient is critically ill due to bilateral numerous cerebral and cerebellar infarcts, respiratory failure, severe anemia and hypovolemic shock and at significant risk of neurological worsening, death form recurrent stroke, hemorrhagic admission, sepsis, septic shock. This patient's care requires constant monitoring of vital signs, hemodynamics, respiratory and cardiac monitoring, review of multiple databases, neurological assessment, discussion with family, other specialists and medical decision making of high complexity. I spent 30 minutes of neurocritical care time in the care of this patient. I had long discussion with husband  at bedside, updated pt current condition, treatment plan and potential prognosis, and answered all the questions.  He expressed understanding and appreciation.   To contact Stroke Continuity provider, please refer to [email or web address]. After hours, contact General Neurology

## 2024-10-30 NOTE — Evaluation (Signed)
 Occupational Therapy Evaluation Patient Details Name: Erin [name] MRN: [id number] DOB: [birth year] Today's Date: 10/30/2024   History of Present Illness   68 y.o. female presents to [facility] hospital on 10/29/2024 as a transfer from [place]. Pt underwent ventral hernia repair on 12/3. Pt required return to OR on 12/4 for ex-lap with finding of abdominal hemorrhage. OR 12/5 for removal of packing and abdominal closure. MRI on 12/8 concerning for multiple embolic infarcts. PMH includes anal cancer, hypothyroidism, GERD, CAD, cardiomyopathy, PVD, HLD, HTN, RLS.     Clinical Impressions PTA patient independent with ADLs, light iADLs and mobility per spouse. Admitted for above and presents with problem list below. She is intubated during eval, vitals stable with dangling at EOB.  Pt opens eyes to name and positional changes but otherwise unable to maintain alertness.  Demonstrates some spasticity into decorticate positioning of Ues, but tolerates PROM without limitations (some tone in L shoulder).  Pt not following commands.  Needing total assist for ADLs at this time, total assist +2 for bed mobility.  Recommend long term care with OT at this time, OT will continue to follow in order to decrease burden of care and optimize participation in Adls.        If plan is discharge home, recommend the following:   Two people to help with walking and/or transfers;Two people to help with bathing/dressing/bathroom;Other (comment) (total care)     Functional Status Assessment   Patient has had a recent decline in their functional status and demonstrates the ability to make significant improvements in function in a reasonable and predictable amount of time.     Equipment Recommendations   Hospital bed;Hoyer lift;Wheelchair (measurements OT);Wheelchair cushion (measurements OT)     Recommendations for Other Services         Precautions/Restrictions   Precautions Precautions: Fall;Other  (comment) Recall of Precautions/Restrictions: Impaired Precaution/Restrictions Comments: ETT, NG tube Restrictions Weight Bearing Restrictions Per Provider Order: No     Mobility Bed Mobility Overal bed mobility: Needs Assistance Bed Mobility: Supine to Sit, Sit to Supine     Supine to sit: Total assist, +2 for physical assistance Sit to supine: Total assist, +2 for physical assistance        Transfers                          Balance Overall balance assessment: Needs assistance Sitting-balance support: No upper extremity supported, Feet unsupported Sitting balance-Leahy Scale: Zero Sitting balance - Comments: totalA Postural control: Posterior lean                                 ADL either performed or assessed with clinical judgement   ADL Overall ADL's : Needs assistance/impaired                                     Functional mobility during ADLs: Total assistance;+2 for physical assistance General ADL Comments: total care     Vision   Additional Comments: difficult to assess, pt opens eyes to name but unable to sustain     Perception         Praxis         Pertinent Vitals/Pain Pain Assessment Pain Assessment: CPOT Facial Expression: Tense (intermittent decorticate spasticity noted in BUE) Body Movements: Absence of movements Muscle Tension:  Tense, rigid Compliance with ventilator (intubated pts.): Coughing but tolerating Vocalization (extubated pts.): N/A CPOT Total: 3 Pain Intervention(s): Limited activity within patient's tolerance     Extremity/Trunk Assessment Upper Extremity Assessment Upper Extremity Assessment: RUE deficits/detail;LUE deficits/detail RUE Deficits / Details: pt with intermittent decorticate spasticity of BUE. Full PROM available at digits, wrist, elbow. Passive R shoulder flexion to ~100 degrees on eval however ventilator tubing limiting full assessment. Does not withdrawl to noxious  stimuli. LUE Deficits / Details: pt with intermittent decorticate spasticity of BUE. Full PROM available at digits, wrist, elbow. Passive L shoulder flexion to ~80 degrees on eval with tone noted limiting ROM. does not withdrawl to noxious stimuli   Lower Extremity Assessment Lower Extremity Assessment: Defer to PT evaluation RLE Deficits / Details: no AROM noted, PROM WFL, no response to painful stimuli LLE Deficits / Details: no AROM noted, PROM WFL, no response to painful stimuli   Cervical / Trunk Assessment Cervical / Trunk Assessment: Normal (head tending to rest in R cervical rotation toward ventilator. Pt does turn head to midline but not past midline today. Poor head control in sitting)   Communication Communication Communication: Impaired Factors Affecting Communication: Trach/intubated   Cognition Arousal: Lethargic (intermittently opens eyes in supine, seldomly opens in sitting) Behavior During Therapy: Flat affect Cognition: Difficult to assess Difficult to assess due to: Intubated, Level of arousal           OT - Cognition Comments: pt opening eyes to name, but does not stay alert with stimulation                 Following commands: Impaired Following commands impaired:  (no command following noted)     Cueing  General Comments   Cueing Techniques: Tactile cues;Visual cues;Gestural cues;Verbal cues  intubated, pt on pressure support 30% FiO2 5 PEEP. HR and SpO2 stable during session. BP stable at 166/62   Exercises     Shoulder Instructions      Home Living Family/patient expects to be discharged to:: Private residence Living Arrangements: Spouse/significant other Available Help at Discharge: Family;Available 24 hours/day Type of Home: House Home Access: Stairs to enter [employer] of Steps: 1   Home Layout: One level     Bathroom Shower/Tub: Producer, Television/film/video: Handicapped height Bathroom Accessibility: Yes   Home  Equipment: Grab bars - tub/shower;Grab bars - toilet;Hand held shower head;Shower seat - built Magazine Features Editor (2 wheels);Cane - single point;Wheelchair - manual   Additional Comments: 1 cat and 1 dog at home      Prior Functioning/Environment Prior Level of Function : Independent/Modified Independent;Driving             Mobility Comments: no AD, tolerance for community mobility limited as times per spouse ADLs Comments: retired, ind ADLs, IADLS, meds; spouse completes grocery shopping    OT Problem List: Decreased strength;Decreased range of motion;Decreased activity tolerance;Impaired balance (sitting and/or standing);Impaired vision/perception;Decreased coordination;Decreased cognition;Decreased safety awareness;Decreased knowledge of use of DME or AE;Decreased knowledge of precautions;Cardiopulmonary status limiting activity;Impaired sensation;Impaired tone;Impaired UE functional use   OT Treatment/Interventions: Self-care/ADL training;Therapeutic exercise;DME and/or AE instruction;Cognitive remediation/compensation;Therapeutic activities;Visual/perceptual remediation/compensation;Balance training;Patient/family education;Neuromuscular education;Splinting      OT Goals(Current goals can be found in the care plan section)   Acute Rehab OT Goals Patient Stated Goal: none stated OT Goal Formulation: Patient unable to participate in goal setting Time For Goal Achievement: 11/13/24 Potential to Achieve Goals: Fair   OT Frequency:  Min 2X/week    Co-evaluation  PT/OT/SLP Co-Evaluation/Treatment: Yes Reason for Co-Treatment: Complexity of the patient's impairments (multi-system involvement);Necessary to address cognition/behavior during functional activity;For patient/therapist safety PT goals addressed during session: Mobility/safety with mobility;Balance;Strengthening/ROM OT goals addressed during session: ADL's and self-care      AM-PAC OT 6 Clicks Daily Activity      Outcome Measure Help from another person eating meals?: Total Help from another person taking care of personal grooming?: Total Help from another person toileting, which includes using toliet, bedpan, or urinal?: Total Help from another person bathing (including washing, rinsing, drying)?: Total Help from another person to put on and taking off regular upper body clothing?: Total Help from another person to put on and taking off regular lower body clothing?: Total 6 Click Score: 6   End of Session Nurse Communication: Mobility status;Precautions;Need for lift equipment  Activity Tolerance: Patient limited by fatigue;Patient limited by lethargy Patient left: in bed;with call bell/phone within reach;with bed alarm set;with family/visitor present  OT Visit Diagnosis: Other abnormalities of gait and mobility (R26.89);Muscle weakness (generalized) (M62.81);Other symptoms and signs involving the nervous system (R29.898);Other symptoms and signs involving cognitive function                Time: 8996-8971 OT Time Calculation (min): 25 min Charges:  OT General Charges $OT Visit: 1 Visit OT Evaluation $OT Eval High Complexity: 1 High  Etta NOVAK, OT Acute Rehabilitation Services Office [phone] Secure Chat Preferred    Etta GORMAN Hope 10/30/2024, 1:08 PM

## 2024-10-30 NOTE — Progress Notes (Signed)
 NAME:  Erin [name], MRN:  [id number], DOB:  July 05, 1956, LOS: 7 ADMISSION DATE:  10/23/2024, CONSULTATION DATE:  10/23/2024 REFERRING MD: Rubin - CCS, CHIEF COMPLAINT:  Hypotension   History of Present Illness:  68 year old woman who presented to [facility] 12/3 for elective hernia repair. 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  Postoperatively, patient remained hypotensive on pressors. Pt is laying in bed calling out in pain of legs and abdomen. Feels that she needs to keep her legs bent. Appears very pale in skin and mucosal membranes. Pt is oriented but has requested all questions are directed to her husband at this time. Pt does endorse dizziness, despite being supine and feeling weak and cold. Otherwise ROS negative or pt is not able to verbalize other symptoms.    Husband on the phone states that leg pain is chronic and typically has been 2/2 low mag and potassium for which she is reportedly on supplementation for. Husband also states that she recently (10/14) had mesenteric artery stenting and he is concerned that this is contributing to her presentation post operatively from the hernia repair. I have discussed with him plan for cvc, repeat labs and likely cta to eval for active bleed. He agrees with plan and consents to further blood product transfusion.     PCCM was asked to consult for hypotension.  Pertinent Medical History:  Kidney stent for stenosis Abdominal wall hernia present for about 2 years Bilateral femoral bypass surgery in 2014 History of mesenteric stent 09/09/2024   Significant Hospital Events: Including procedures, antibiotic start and stop dates in addition to other pertinent events   12/3 admitted to ICU postop 12/3-post hernia  repair 12/4 taken back to the OR for hemoperitoneum-active bleed was not identified, abdomen packed with wound VAC in place 12/5 back to the OR for an open abdomen-no evidence of ongoing bleeding and abdomen was closed 12/8 - CT Head with interval development (since 04/2024) of multiple cortical and subcortical hypodensities in bilateral cerebellar and cerebral hemispheres suggestive of acute/subacute infarcts. MRI Brain with numerous areas of restricted diffusion throughout the cerebral hemispheres/cerebellum c/f acute infarcts of embolic etiology, associated mild edema (R occipital lobe, L temporal lobe).   Interim History/Subjective:  Transferred to [employer] SBT  Objective:    Blood pressure (!) 169/60, pulse 70, temperature 99.3 F (37.4 C), temperature source Axillary, resp. rate 18, height 5' 3 (1.6 m), weight 52 kg, SpO2 97%.    Vent Mode: PSV;CPAP FiO2 (%):  [30 %] 30 % Set Rate:  [14 bmp] 14 bmp Vt Set:  [420 mL] 420 mL PEEP:  [5 cmH20] 5 cmH20 Pressure Support:  [5 cmH20-8 cmH20] 8 cmH20 Plateau Pressure:  [14 cmH20-15 cmH20] 15 cmH20   Intake/Output Summary (Last 24 hours) at 10/30/2024 1347 Last data filed at 10/30/2024 0600 Gross per 24 hour  Intake 2998.44 ml  Output 2525 ml  Net 473.44 ml   Filed Weights   10/28/24 1000 10/29/24 0500 10/30/24 0500  Weight: 53.9 kg 53.2 kg 52 kg   Physical Examination:  General: Elderly appearing female on the vent HEENT: [place]/AT, PERRL, no JVD Neuro: unresponsive, does not follow commands CV: RRR, no MRG PULM: Clear bilateral breath sounds. Tolerating 8/5. GI: Soft, non-distended. Midline wound clean/dry/intact without erythema/drainage.  Extremities: Bilateral symmetric  1+ LE edema noted. Improved edema of bilateral hands. Skin: Warm/dry, no rashes.  Na 142, K 3.2, Creatinine 2.26 satble, BUN 43, Mag 1.5 I/O 7.5L pos for admission. UOP 4.4L x 24 hrs.   Resolved Problem List:    Assessment and Plan:    Acute bilateral cerebral and cerebellar infarcts  CT Head with interval development (since 04/2024) of multiple cortical and subcortical hypodensities in bilateral cerebellar and cerebral hemispheres suggestive of acute/subacute infarcts. Concern for embolic strokes 2/2 hypotension in the setting of hemorrhagic shock versus DIC, hypoperfusion vs. hypercoagulability. MRI Brain with numerous areas of restricted diffusion throughout the cerebral hemispheres/cerebellum c/f acute infarcts of embolic etiology, associated mild edema (R occipital lobe, L temporal lobe) - Out of  window for any interventions at this time [facility] service following - Continue ASA 81mg  daily (ok per CCS) - Frequent neuro checks - Neuroprotective measures: HOB > 30 degrees, normoglycemia, normothermia, electrolytes WNL - PT/OT/SLP when able to participate in care  Hemorrhagic shock, resolved Received blood products (4U PRBCs, 1U FFP, 1U Cryo). Concern for DIC - Goal MAP > 65 - Hold home antihypertensives  Thrombocytopenia - improved - Trend CBC  S/p hernia repair, elective 12/3 S/p ex-lap 12/4, 12/5 - Postoperative management per CCS - Wound care per surgery - Transferred to [facility] 12/9 per CCS request, may need trach/PEG pending progress and GOC discussions  Acute kidney injury - Trend BMP - Replete electrolytes as indicated - Appreciate nephrology, Lasix  x 1 today  Respiratory insufficiency HCAP Cultures requested-abundant gram-negative rods, few gram-positive cocci. - Continue full vent support (4-8cc/kg IBW) - Wean FiO2 for O2 sat > 90% - Daily WUA/SBT, extubation remains precluded by mental status - VAP bundle - Zosyn  - Pulmonary hygiene - PAD protocol for sedation: Off of sedation at present - Repeat Resp Cx 12/9 pending  At risk for malnutrition - Appreciate RD assistance - TF per CCS, advance as tolerated  GOC - FULL CODE at present - GOC discussed with familye 12/10, ongoing discussions  pending - If no improvement, would benefit from PMT  Critical care time:   CC time 39 minutes  Deward Eastern, AGACNP-BC [facility]  See Amion for personal pager PCCM on call pager [phone] until 7pm. Please call Elink 7p-7a. [phone]  10/30/2024 2:27 PM

## 2024-10-30 NOTE — Progress Notes (Signed)
 [facility] Progress Note  Subjective:  UOP 4.5 L yesterday, net neg 1.3 L  Creatinine pending    Vitals:   10/30/24 0728 10/30/24 0759 10/30/24 1034 10/30/24 1146  BP:   (!) 169/60   Pulse: 70     Resp: 18     Temp:  99.5 F (37.5 C)  99.3 F (37.4 C)  TempSrc:  Axillary  Axillary  SpO2: 97%     Weight:      Height:        Exam: Gen: thin woman intubated on vent  ENT: ETT in place Neck: L internal jugular triple lumen central line CV: RRR Abd: abd binder in place Lungs: coarse ant on vent GU: foley draining normal yellow urine Extr: bilat 2+ hip edema and 1+ pretib/ UE edema, improving Neuro: sedated   H/o vascular surgeries/ procedures: 2018: Dr Oris: aorta-bilat femoral artery bypass graft, embolectomy 2018: other local vascular procedures on L and R  2019: Dr Serene: angioplasty/ stenting of SMA and celiac arteries 2021: repeat angio and angioplasty of SMA and celiac arteries 2023: abd pain -> duplex showed widely patent mesenteric stents w/o any evidence of recurrent stenosis (SMA, celiac) 09/03/24: 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, 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: underwent drug-coated balloon angioplasty to the celiac and SMA artery under conscious sedation (57 min); bialt fem-pop bypass grafts were patent  Date   Creat  eGFR (ml/min) 2014- nov 2024 0.63- 0.90 67- 95 ml/min Feb 2025  1.88 >> 1.12 AKI, 29- 54 ml/min March 2025  1.06  54 ml/min   July 2025  1.38  42 ml/min   Aug 2025  1.49  38 ml/min Oct 2025  1.70      Oct 18, 2024  1.47  38 ml/min 12/03   1.81  30 ml/min  12/04   1.93  28  12/05   2.75  18 12/06   2.88  17 12/07   2.85  17 10/28/24  2.65  19 ml/min   UA 12/9 - negative, prot 100   Assessment/ Plan: AKI on CKD 3b: b/l creatinine 1.4- 1.5 from July- nov 2025, eGFR 38- 42 ml/min. Creat here was 1.8 on admission then peaked at 2.8 on 12/07. Pt admitted for hernia  repair (12/03) which was complicated by DIC and intra-abd bleeding requiring emergent ex-lap 12/04. AKI is suspected ATN due to shock (d/t ABLA) + contrast +/- DIC.  CTA showed no flow to L kidney and there was recent L RAS detected on duplex in 07/2024. UOP good, and creat improved down to 2.4 yesterday. Labs pending today. Getting IV lasix  40mg  prn due to vol excess/ edema. Redose IV lasix  x 1 today. Get labs in am, will follow.  Volume: LE edema is improving S/P hernia repair Intra-abdominal bleeding: required ex-lap w/ packing.  DIC: plts were 21K on admission, improved up to 110K now.   Shock: due to ABLA, resolved H/o vascular disease: prior aorta-bifem surgery, also hx of stenting of the celiac/ SMA arteries in the past.  L renal artery stenosis: CTA here 12/04 showed no contrast in the L kidney (+ in the R kidney), and there was recent L RAS detected on duplex in 07/2024 (> 60% stenosis). Would not recommend pursing the L RAS at this time.    Myer Fret MD  [facility] 10/30/2024, 12:18 PM  Recent Labs  Lab 10/28/24 0450 10/28/24 1642 10/29/24 0530  HGB 8.0*  --  8.6*  ALBUMIN  2.1*  --  2.2*  2.2*  CALCIUM  7.7*  --  8.0*  PHOS 3.6 3.1 2.4*  2.4*  CREATININE 2.65*  --  2.43*  K 3.3*  --  3.3*   No results for input(s): IRON, TIBC, FERRITIN in the last 168 hours. Inpatient medications:  amLODipine   5 mg Per Tube Daily   aspirin   81 mg Per Tube Daily   carvedilol   12.5 mg Per Tube BID WC   Chlorhexidine  Gluconate Cloth  6 each Topical Daily   heparin  injection (subcutaneous)  5,000 Units Subcutaneous Q8H   Influenza vac split trivalent PF  0.5 mL Intramuscular Tomorrow-1000   levothyroxine   75 mcg Per Tube Q0600   mouth rinse  15 mL Mouth Rinse Q2H   pantoprazole  (PROTONIX ) IV  40 mg Intravenous QHS    feeding supplement (VITAL AF 1.2 CAL) 50 mL/hr at 10/30/24 0600   piperacillin -tazobactam (ZOSYN )  IV     acetaminophen , fentaNYL  (SUBLIMAZE ) injection, fentaNYL  (SUBLIMAZE )  injection, hydrALAZINE , labetalol , methocarbamol , ondansetron  **OR** ondansetron  (ZOFRAN ) IV, mouth rinse, oxyCODONE 

## 2024-10-31 DIAGNOSIS — Z8719 Personal history of other diseases of the digestive system: Secondary | ICD-10-CM | POA: Diagnosis not present

## 2024-10-31 DIAGNOSIS — D696 Thrombocytopenia, unspecified: Secondary | ICD-10-CM | POA: Diagnosis not present

## 2024-10-31 DIAGNOSIS — N179 Acute kidney failure, unspecified: Secondary | ICD-10-CM | POA: Diagnosis not present

## 2024-10-31 LAB — BASIC METABOLIC PANEL WITH GFR
Anion gap: 13 (ref 5–15)
BUN: 49 mg/dL — ABNORMAL HIGH (ref 8–23)
CO2: 26 mmol/L (ref 22–32)
Calcium: 7.6 mg/dL — ABNORMAL LOW (ref 8.9–10.3)
Chloride: 107 mmol/L (ref 98–111)
Creatinine, Ser: 2.19 mg/dL — ABNORMAL HIGH (ref 0.44–1.00)
GFR, Estimated: 24 mL/min — ABNORMAL LOW (ref >60)
Glucose, Bld: 124 mg/dL — ABNORMAL HIGH (ref 70–99)
Potassium: 3.9 mmol/L (ref 3.5–5.1)
Sodium: 146 mmol/L — ABNORMAL HIGH (ref 135–145)

## 2024-10-31 LAB — CBC
HCT: 29.9 % — ABNORMAL LOW (ref 36.0–46.0)
Hemoglobin: 9.7 g/dL — ABNORMAL LOW (ref 12.0–15.0)
MCH: 30.5 pg (ref 26.0–34.0)
MCHC: 32.4 g/dL (ref 30.0–36.0)
MCV: 94 fL (ref 80.0–100.0)
Platelets: 191 K/uL (ref 150–400)
RBC: 3.18 MIL/uL — ABNORMAL LOW (ref 3.87–5.11)
RDW: 15.8 % — ABNORMAL HIGH (ref 11.5–15.5)
WBC: 14.6 K/uL — ABNORMAL HIGH (ref 4.0–10.5)
nRBC: 0 % (ref 0.0–0.2)

## 2024-10-31 LAB — CULTURE, RESPIRATORY W GRAM STAIN: Culture: NORMAL

## 2024-10-31 LAB — PHOSPHORUS: Phosphorus: 2.8 mg/dL (ref 2.5–4.6)

## 2024-10-31 LAB — GLUCOSE, CAPILLARY
Glucose-Capillary: 120 mg/dL — ABNORMAL HIGH (ref 70–99)
Glucose-Capillary: 129 mg/dL — ABNORMAL HIGH (ref 70–99)
Glucose-Capillary: 131 mg/dL — ABNORMAL HIGH (ref 70–99)
Glucose-Capillary: 132 mg/dL — ABNORMAL HIGH (ref 70–99)
Glucose-Capillary: 132 mg/dL — ABNORMAL HIGH (ref 70–99)
Glucose-Capillary: 138 mg/dL — ABNORMAL HIGH (ref 70–99)

## 2024-10-31 LAB — MAGNESIUM: Magnesium: 1.9 mg/dL (ref 1.7–2.4)

## 2024-10-31 LAB — PROCALCITONIN: Procalcitonin: 3.88 ng/mL

## 2024-10-31 MED ORDER — SODIUM CHLORIDE 3 % IN NEBU
4.0000 mL | INHALATION_SOLUTION | Freq: Two times a day (BID) | RESPIRATORY_TRACT | Status: AC
  Administered 2024-10-31 – 2024-11-01 (×3): 4 mL via RESPIRATORY_TRACT
  Filled 2024-10-31 (×3): qty 4

## 2024-10-31 MED ORDER — ORAL CARE MOUTH RINSE: 15.0000 mL | OROMUCOSAL | Status: DC | PRN

## 2024-10-31 MED ORDER — FREE WATER
200.0000 mL | Freq: Four times a day (QID) | Status: DC
  Administered 2024-10-31 – 2024-11-01 (×4): 200 mL

## 2024-10-31 MED ORDER — POTASSIUM CHLORIDE 20 MEQ PO PACK
40.0000 meq | PACK | Freq: Once | ORAL | Status: AC
  Administered 2024-10-31: 40 meq
  Filled 2024-10-31: qty 2

## 2024-10-31 MED ORDER — DEXTROSE 5 % IV SOLN: INTRAVENOUS | Status: DC

## 2024-10-31 MED ORDER — FUROSEMIDE 10 MG/ML IJ SOLN
40.0000 mg | Freq: Once | INTRAMUSCULAR | Status: AC
  Administered 2024-10-31: 40 mg via INTRAVENOUS
  Filled 2024-10-31: qty 4

## 2024-10-31 MED ORDER — ORAL CARE MOUTH RINSE
15.0000 mL | OROMUCOSAL | Status: DC
  Administered 2024-10-31 – 2024-11-25 (×91): 15 mL via OROMUCOSAL

## 2024-10-31 NOTE — Progress Notes (Addendum)
 Nutrition Follow-up  DOCUMENTATION CODES:   Non-severe (moderate) malnutrition in context of chronic illness  INTERVENTION:   Continue TF via NG tube: Vital AF 1.2 at 50 ml/h (1200 ml per day) Free water  flushes 200 ml q6h Provides 1440 kcal, 90 gm protein, 1773 ml free water  daily.  When diet advanced, add Boost Breeze po TID, each supplement provides 250 kcal and 9 grams of protein.  NUTRITION DIAGNOSIS:   Moderate Malnutrition related to chronic illness as evidenced by moderate muscle depletion, percent weight loss (15% in 6 months); ongoing.  GOAL:   Patient will meet greater than or equal to 90% of their needs; met with TF.  MONITOR:   Diet advancement, PO intake, TF tolerance  REASON FOR ASSESSMENT:   Consult Enteral/tube feeding initiation and management (Trickle TF)  ASSESSMENT:   68 y.o. female with PMH GERD. IBS, CAD, HTN who presented for elective hernia repair.  Patient was extubated earlier today. 18Fr NG tube in place and patient is receiving TF: Vital AF 1.2 at 50 ml/h with free water  flushes 200 ml q6h. This is meeting 100% of estimated nutrition needs. Patient had 1,050 ml emesis/NG output at 3pm yesterday. ? TF was held for a few hours. Currently tolerating TF at goal rate.  Currently NPO.  RN to complete swallow screen and consult SLP if patient seems to have trouble swallowing.   Labs reviewed.  Na 146 K 3.9 CBG: 131-120-132  Medications reviewed and include protonix . Lasix  has been discontinued. Received potassium chloride  x 1 this morning via tube.  IVF: D5W at 100 ml/h  Admit weight 44.4 kg (12/3) Current weight 49.4 kg (12/11)  UOP: 2,450 ml x 24 h NG output: 1,050 ml at 3pm 12/10  Diet Order:   Diet Order             Diet NPO time specified  Diet effective now           Diet - low sodium heart healthy                   EDUCATION NEEDS:   Not appropriate for education at this time  Skin:  Skin Assessment: Skin Integrity  Issues: Skin Integrity Issues:: Incisions Incisions: Surgical Abdomen  Last BM:  12/11 type 7  Height:   Ht Readings from Last 1 Encounters:  10/25/24 5' 3 (1.6 m)    Weight:   Wt Readings from Last 1 Encounters:  10/31/24 49.4 kg    Ideal Body Weight:  52.27 kg  BMI:  Body mass index is 19.29 kg/m.  Estimated Nutritional Needs:   Kcal:  1500-1700  Protein:  75-90 grams  Fluid:  >/= 1.5L   Suzen HUNT RD, LDN, CNSC Contact via secure chat. If unavailable, use group chat RD Inpatient.

## 2024-10-31 NOTE — Progress Notes (Signed)
 NAME:  Erin [name], MRN:  [id number], DOB:  04-18-1956, LOS: 8 ADMISSION DATE:  10/23/2024, CONSULTATION DATE:  10/23/2024 REFERRING MD: Rubin - CCS, CHIEF COMPLAINT:  Hypotension   History of Present Illness:  68 year old woman who presented to [facility] 12/3 for elective hernia repair. 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  Postoperatively, patient remained hypotensive on pressors. Pt is laying in bed calling out in pain of legs and abdomen. Feels that she needs to keep her legs bent. Appears very pale in skin and mucosal membranes. Pt is oriented but has requested all questions are directed to her husband at this time. Pt does endorse dizziness, despite being supine and feeling weak and cold. Otherwise ROS negative or pt is not able to verbalize other symptoms.    Husband on the phone states that leg pain is chronic and typically has been 2/2 low mag and potassium for which she is reportedly on supplementation for. Husband also states that she recently (10/14) had mesenteric artery stenting and he is concerned that this is contributing to her presentation post operatively from the hernia repair. I have discussed with him plan for cvc, repeat labs and likely cta to eval for active bleed. He agrees with plan and consents to further blood product transfusion.     PCCM was asked to consult for hypotension.  Pertinent Medical History:  Kidney stent for stenosis Abdominal wall hernia present for about 2 years Bilateral femoral bypass surgery in 2014 History of mesenteric stent 09/09/2024   Significant Hospital Events: Including procedures, antibiotic start and stop dates in addition to other pertinent events   12/3 admitted to ICU postop 12/3-post hernia  repair 12/4 taken back to the OR for hemoperitoneum-active bleed was not identified, abdomen packed with wound VAC in place 12/5 back to the OR for an open abdomen-no evidence of ongoing bleeding and abdomen was closed 12/8 - CT Head with interval development (since 04/2024) of multiple cortical and subcortical hypodensities in bilateral cerebellar and cerebral hemispheres suggestive of acute/subacute infarcts. MRI Brain with numerous areas of restricted diffusion throughout the cerebral hemispheres/cerebellum c/f acute infarcts of embolic etiology, associated mild edema (R occipital lobe, L temporal lobe).  12/11 tolerating PSV 5/5 well but mental status remains poor  Interim History/Subjective:  Tolerating PSV 5/5 well but remains altered. Not following commands but per RN and spouse, she had eyes open earlier and was tracking  Objective:    Blood pressure (!) 151/72, pulse 93, temperature 98.9 F (37.2 C), temperature source Axillary, resp. rate 20, height 5' 3 (1.6 m), weight 49.4 kg, SpO2 95%.    Vent Mode: PSV;CPAP FiO2 (%):  [30 %] 30 % Set Rate:  [14 bmp] 14 bmp Vt Set:  [420 mL] 420 mL PEEP:  [5 cmH20] 5 cmH20 Pressure Support:  [5 cmH20-8 cmH20] 5 cmH20 Plateau Pressure:  [15 cmH20] 15 cmH20   Intake/Output Summary (Last 24 hours) at 10/31/2024 1124 Last data filed at 10/31/2024 0700 Gross per 24 hour  Intake 1620 ml  Output 3700 ml  Net -2080 ml   Filed Weights   10/29/24 0500 10/30/24 0500 10/31/24 0500  Weight: 53.2 kg 52 kg 49.4 kg   Physical Examination:  General: Elderly appearing female on the vent on PSV 5/5 HEENT: [place]/AT, no JVD Neuro: Per  RN and husband she had eyes open and was tracking earlier. Currently minimally responsive and does not follow commands CV: RRR, no MRG PULM: Clear bilateral breath sounds on PSV 5/5 GI: Soft, non-distended. Midline wound clean/dry/intact without erythema/drainage.  Extremities: Bilateral symmetric 1+ LE edema noted.  Improved edema of bilateral hands. Skin: Warm/dry, no rashes.   Assessment and Plan:   Acute bilateral cerebral and cerebellar infarcts  CT Head with interval development (since 04/2024) of multiple cortical and subcortical hypodensities in bilateral cerebellar and cerebral hemispheres suggestive of acute/subacute infarcts. Concern for embolic strokes 2/2 hypotension in the setting of hemorrhagic shock versus DIC, hypoperfusion vs. hypercoagulability. MRI Brain with numerous areas of restricted diffusion throughout the cerebral hemispheres/cerebellum c/f acute infarcts of embolic etiology, associated mild edema (R occipital lobe, L temporal lobe) - Out of  window for any interventions at this time [facility] service following - Continue ASA 81mg  daily (ok per CCS) - Frequent neuro checks - Neuroprotective measures: HOB > 30 degrees, normoglycemia, normothermia, electrolytes WNL - PT/OT/SLP when able to participate in care  Hx HTN Hemorrhagic shock, resolved Received blood products (4U PRBCs, 1U FFP, 1U Cryo). Concern for DIC Thrombocytopenia - resolved - Goal MAP > 65 - Continue Amlodipine , Carvedilol , Hydralazine  PRN, Labetalol  PRN,  - Trend CBC  S/p hernia repair, elective 12/3 S/p ex-lap 12/4, 12/5 - Postoperative management per CCS - Wound care per surgery - May need trach/PEG pending progress and GOC discussions  Acute kidney injury - gradually improving - Repeat Lasix  x 1 today - Trend BMP - Replete electrolytes as indicated - Appreciate nephrology assistance  Respiratory insufficiency - weaning well, tolerating PSV 5/5 and from pulmonary standpoint doing OK, but mental status is poor HCAP Cultures requested-abundant gram-negative rods, few gram-positive cocci. - Continue weaning efforts - Ongoing discussions with family regarding next steps. Do we extubate and watch with hopes she is able to protect airway etc? If she were to fail, we would need to decide on reintubation  with plans for trach versus comfort. Family appear to be leaning towards giving her a chance for continued recovery so possibly would lean for trach if needed - VAP bundle - Zosyn  - Pulmonary hygiene - Hold sedation as able (currently off all continuous sedation) - Repeat Resp Cx 12/9 pending  At risk for malnutrition - Appreciate RD assistance - TF per CCS, advance as tolerated  GOC - FULL CODE at present - GOC discussed with family 12/11, ongoing discussions pending - If no improvement, would benefit from PMT  Critical care time: 30 min.    Sammi Gore, PA - [facility] For pager details, please see AMION or use Epic chat  After 1900, please call [facility] for cross coverage needs 10/31/2024, 11:24 AM

## 2024-10-31 NOTE — Progress Notes (Addendum)
 [facility] Progress Note  Subjective:  UOP 2.4 L yest and 1.3 GI losses Na+ 146 today Creat stable at 2.1   Vitals:   10/31/24 1156 10/31/24 1200 10/31/24 1254 10/31/24 1300  BP:  (!) 174/63  (!) 158/58  Pulse: 84 88 100 87  Resp: (!) 22 (!) 23 (!) 25 (!) 24  Temp: (!) 100.4 F (38 C)     TempSrc: Axillary     SpO2: 97% 97% 97% 96%  Weight:      Height:        Exam: Gen: thin woman intubated on vent  ENT: ETT in place Neck: L internal jugular triple lumen central line CV: RRR Abd: abd binder in place Lungs: coarse ant on vent GU: foley draining normal yellow urine Extr: bilat 2+ hip edema and 1+ pretib/ UE edema, improving Neuro: sedated   H/o vascular surgeries/ procedures: 2018: Dr Oris: aorta-bilat femoral artery bypass graft, embolectomy 2018: other local vascular procedures on L and R  2019: Dr Serene: angioplasty/ stenting of SMA and celiac arteries 2021: repeat angio and angioplasty of SMA and celiac arteries 2023: abd pain -> duplex showed widely patent mesenteric stents w/o any evidence of recurrent stenosis (SMA, celiac) 09/03/24: 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, 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: underwent drug-coated balloon angioplasty to the celiac and SMA artery under conscious sedation (57 min); bialt fem-pop bypass grafts were patent  Date   Creat  eGFR (ml/min) 2014- nov 2024 0.63- 0.90 67- 95 ml/min Feb 2025  1.88 >> 1.12 AKI, 29- 54 ml/min March 2025  1.06  54 ml/min   July 2025  1.38  42 ml/min   Aug 2025  1.49  38 ml/min Oct 2025  1.70      Oct 18, 2024  1.47  38 ml/min 12/03   1.81  30 ml/min  12/04   1.93  28  12/05   2.75  18 12/06   2.88  17 12/07   2.85  17 10/28/24  2.65  19 ml/min   UA 12/9 - negative, prot 100   Assessment/ Plan: AKI on CKD 3b: b/l creatinine 1.4- 1.5 from July- nov 2025, eGFR 38- 42 ml/min. Creat here was 1.8 on admission then peaked at 2.8  on 12/07. Pt admitted for hernia repair (12/03) which was complicated by DIC and intra-abd bleeding requiring emergent ex-lap 12/04. AKI is suspected ATN due to shock (d/t ABLA) + contrast +/- DIC.  CTA showed no flow to L kidney and there was recent L RAS . 60% detected on duplex in 07/2024 (see below). UA 12/09 was negative. UOP good, creatinine remains stable and have no further suggestions, will sign off. Pt call w/ any questions.  Volume excess: LE edema improved after 3d of IV lasix  40mg .  Hypernatremia: Na+ 146, new and probably related to diuresis + GI losses. Ordered D5W infusion at 100 cc/hr x 24 hrs, and consider holding IV Lasix  for a while.  AMS: bilateral CVA's related to severe anemia and hypovolemic shock S/P hernia repair Intra-abdominal bleeding: required ex-lap w/ packing.  DIC: plts were 21K on admission, improved up to 110K now.   Shock: due to ABLA, resolved H/o vascular disease: prior aorta-bifem surgery, also hx of stenting of the celiac/ SMA arteries in the past.  L renal artery stenosis: CTA here 12/04 showed no contrast in the L kidney (+ in the R kidney), and there was recent L  RAS detected on duplex in 07/2024 (> 60% stenosis). Would not recommend pursing the L RAS at this time.    Myer Fret MD  [facility] 10/31/2024, 2:28 PM  Recent Labs  Lab 10/28/24 0450 10/28/24 1642 10/29/24 0530 10/30/24 1240 10/31/24 0225  HGB 8.0*  --  8.6* 8.7* 9.7*  ALBUMIN  2.1*  --  2.2*  2.2*  --   --   CALCIUM  7.7*  --  8.0* 7.2* 7.6*  PHOS 3.6   < > 2.4*  2.4* 2.7 2.8  CREATININE 2.65*  --  2.43* 2.26* 2.19*  K 3.3*  --  3.3* 3.2* 3.9   < > = values in this interval not displayed.   No results for input(s): IRON, TIBC, FERRITIN in the last 168 hours. Inpatient medications:  amLODipine   5 mg Per Tube Daily   aspirin   81 mg Per Tube Daily   carvedilol   12.5 mg Per Tube BID WC   Chlorhexidine  Gluconate Cloth  6 each Topical Daily   free water   200 mL Per Tube Q6H    Gerhardt's butt cream   Topical BID   heparin  injection (subcutaneous)  5,000 Units Subcutaneous Q8H   Influenza vac split trivalent PF  0.5 mL Intramuscular Tomorrow-1000   levothyroxine   75 mcg Per Tube Q0600   mouth rinse  15 mL Mouth Rinse Q2H   pantoprazole  (PROTONIX ) IV  40 mg Intravenous QHS    feeding supplement (VITAL AF 1.2 CAL) 50 mL/hr at 10/31/24 0700   piperacillin -tazobactam (ZOSYN )  IV Stopped (10/31/24 0549)   acetaminophen , fentaNYL  (SUBLIMAZE ) injection, fentaNYL  (SUBLIMAZE ) injection, hydrALAZINE , labetalol , methocarbamol , ondansetron  **OR** ondansetron  (ZOFRAN ) IV, mouth rinse, oxyCODONE 

## 2024-10-31 NOTE — Progress Notes (Signed)
 6 Days Post-Op   Subjective/Chief Complaint: Pt with on overnight issues    Objective: Vital signs in last 24 hours: Temp:  [99.3 F (37.4 C)-100.2 F (37.9 C)] 99.3 F (37.4 C) (12/11 0700) Pulse Rate:  [68-107] 94 (12/11 0700) Resp:  [13-26] 22 (12/11 0700) BP: (131-188)/(55-91) 173/67 (12/11 0700) SpO2:  [92 %-97 %] 94 % (12/11 0700) FiO2 (%):  [30 %] 30 % (12/11 0600) Weight:  [49.4 kg] 49.4 kg (12/11 0500) Last BM Date : 10/30/24  Intake/Output from previous day: 12/10 0701 - 12/11 0700 In: 1620 [I.V.:40; NG/GT:1370; IV Piggyback:200] Out: 3700 [Urine:2450; Emesis/NG output:1050; Stool:200] Intake/Output this shift: No intake/output data recorded.  PE:  Constitutional: No acute distress, opens eyes spont. Lungs: on vent, normal respiratory effort CV: regular rate and rhythm, no murmurs, no peripheral edema, pedal pulses 2+ GI: Soft,  inc c/d/i Neuro: moves UE to gravity, not purposeful   Lab Results:  Recent Labs    10/30/24 1240 10/31/24 0225  WBC 11.9* 14.6*  HGB 8.7* 9.7*  HCT 26.1* 29.9*  PLT 151 191   BMET Recent Labs    10/30/24 1240 10/31/24 0225  NA 142 146*  K 3.2* 3.9  CL 107 107  CO2 27 26  GLUCOSE 141* 124*  BUN 43* 49*  CREATININE 2.26* 2.19*  CALCIUM  7.2* 7.6*   PT/INR Recent Labs    10/29/24 0530  LABPROT 15.1  INR 1.1   ABG No results for input(s): PHART, HCO3 in the last 72 hours.  Invalid input(s): PCO2, PO2  Studies/Results: VAS US  CAROTID Result Date: 10/29/2024 Carotid Arterial Duplex Study Patient Name:  Erin [name]  Date of Exam:   10/29/2024 Medical Rec #: [id number]          Accession #:    [id number] Date of Birth: [birth year]          Patient Gender: F Patient Age:   68 years Exam Location:  [facility] Procedure:      VAS US  CAROTID Referring Phys: ELIGIO LAV --------------------------------------------------------------------------------  Indications:   CVA. Risk Factors:  Hypertension,  hyperlipidemia, coronary artery disease, PAD. Other Factors: Status post hernia repair surgery on 10/23/24                S/P aortobifem bypass, instent SMA and Celiac. Limitations    Today's exam was limited due to Left neck cathether/tape. Performing Technologist: Ricka Sturdivant-Jones RDMS, RVT  Examination Guidelines: A complete evaluation includes B-mode imaging, spectral Doppler, color Doppler, and power Doppler as needed of all accessible portions of each vessel. Bilateral testing is considered an integral part of a complete examination. Limited examinations for reoccurring indications may be performed as noted.  Right Carotid Findings: +----------+--------+--------+--------+------------------+--------+           PSV cm/sEDV cm/sStenosisPlaque DescriptionComments +----------+--------+--------+--------+------------------+--------+ CCA Prox  118     18                                         +----------+--------+--------+--------+------------------+--------+ CCA Distal100     16                                         +----------+--------+--------+--------+------------------+--------+ ICA Prox  179     41      40-59%  heterogenous               +----------+--------+--------+--------+------------------+--------+  ICA Mid   161     32      1-39%                              +----------+--------+--------+--------+------------------+--------+ ICA Distal112     28                                         +----------+--------+--------+--------+------------------+--------+ ECA       139                                                +----------+--------+--------+--------+------------------+--------+ +----------+--------+-------+----------------+-------------------+           PSV cm/sEDV cmsDescribe        Arm Pressure (mmHG) +----------+--------+-------+----------------+-------------------+ Subclavian172            Multiphasic, WNL                     +----------+--------+-------+----------------+-------------------+ +---------+--------+---+--------+--+---------+ VertebralPSV cm/s106EDV cm/s26Antegrade +---------+--------+---+--------+--+---------+  Left Carotid Findings: +----------+--------+--------+--------+------------------+--------+           PSV cm/sEDV cm/sStenosisPlaque DescriptionComments +----------+--------+--------+--------+------------------+--------+ CCA Prox  84      17                                         +----------+--------+--------+--------+------------------+--------+ CCA Distal103     17                                         +----------+--------+--------+--------+------------------+--------+ ICA Prox  114     19      1-39%                              +----------+--------+--------+--------+------------------+--------+ ICA Distal86      23                                         +----------+--------+--------+--------+------------------+--------+ ECA       105                                                +----------+--------+--------+--------+------------------+--------+ +----------+--------+--------+--------+-------------------+           PSV cm/sEDV cm/sDescribeArm Pressure (mmHG) +----------+--------+--------+--------+-------------------+ Subclavian260                                         +----------+--------+--------+--------+-------------------+ +---------+--------+--+--------+--+ VertebralPSV cm/s95EDV cm/s19 +---------+--------+--+--------+--+   Summary: Right Carotid: Velocities in the right ICA are consistent with a 40-59%                stenosis. Left Carotid: Velocities in the left ICA are consistent with a 1-39% stenosis. Vertebrals:  Bilateral vertebral arteries demonstrate antegrade flow.  Subclavians: Normal flow hemodynamics were seen in bilateral subclavian              arteries. *See table(s) above for measurements and observations.     Preliminary      Anti-infectives: Anti-infectives (From admission, onward)    Start     Dose/Rate Route Frequency Ordered Stop   10/30/24 1400  piperacillin -tazobactam (ZOSYN ) IVPB 2.25 g        2.25 g 100 mL/hr over 30 Minutes Intravenous Every 8 hours 10/30/24 0939     10/28/24 1600  ceFEPIme  (MAXIPIME ) 2 g in sodium chloride  0.9 % 100 mL IVPB  Status:  Discontinued        2 g 200 mL/hr over 30 Minutes Intravenous Every 24 hours 10/28/24 1441 10/30/24 0939   10/26/24 1800  cefTRIAXone  (ROCEPHIN ) 2 g in sodium chloride  0.9 % 100 mL IVPB  Status:  Discontinued        2 g 200 mL/hr over 30 Minutes Intravenous Daily-1800 10/26/24 1739 10/28/24 1441   10/23/24 0700  ceFAZolin  (ANCEF ) IVPB 2g/100 mL premix        2 g 200 mL/hr over 30 Minutes Intravenous On call to O.R. 10/23/24 9351 10/23/24 9166       Assessment/Plan: s/p Procedures with comments: LAPAROTOMY, EXPLORATORY (N/A) - REMOVAL OF PACKING POD 7 from lap VHR and reexploration for bleeding, POD 5 from reexploration and abdominal closure -AKI appears to be improving slowly, appreciate Nephro assistance -Appreciate CCM assistance.  Breathing over the vent.  Weaning some - TF @ goal -On ASA for CVA, appreciate Neuro assistance .     LOS: 8 days    Lynda Leos 10/31/2024

## 2024-10-31 NOTE — Procedures (Signed)
 Extubation Procedure Note  Patient Details:   Name: Erin [name] DOB: [birth year] MRN: [id number]   Airway Documentation:    Vent end date: 10/31/24 Vent end time: 1254   Evaluation  O2 sats: stable throughout Complications: No apparent complications Patient did tolerate procedure well. Bilateral Breath Sounds: Diminished, Clear   No  Pt extubated to 4L [place]. Positive cuff leak prior to extubation.  Dena VEAR Samuel 10/31/2024, 12:59 PM

## 2024-10-31 NOTE — Progress Notes (Addendum)
 STROKE TEAM PROGRESS NOTE    SIGNIFICANT HOSPITAL EVENTS 12/3 - hernia repair 12/4 taken back to the OR for hemoperitoneum-active bleed was not identified, abdomen packed with wound VAC in place 12/5 back to the OR for an open abdomen-no evidence of ongoing bleeding and abdomen was closed 12/8 - CT Head and MRI brain with bil strokes   INTERIM HISTORY/SUBJECTIVE Patient is seen in her room with 1 family member at the bedside.  She has been hemodynamically stable with Tmax of 100.4.  She is more responsive today and is able to track the examiner to the left but not the right, although she still does not follow commands.  OBJECTIVE  CBC    Component Value Date/Time   WBC 14.6 (H) 10/31/2024 0225   RBC 3.18 (L) 10/31/2024 0225   HGB 9.7 (L) 10/31/2024 0225   HGB 11.9 11/08/2019 1403   HGB 10.6 (L) 11/28/2013 1148   HCT 29.9 (L) 10/31/2024 0225   HCT 35.7 11/08/2019 1403   HCT 31.2 (L) 11/28/2013 1148   PLT 191 10/31/2024 0225   PLT 250 11/08/2019 1403   MCV 94.0 10/31/2024 0225   MCV 99 (H) 11/08/2019 1403   MCV 101.4 (H) 11/28/2013 1148   MCH 30.5 10/31/2024 0225   MCHC 32.4 10/31/2024 0225   RDW 15.8 (H) 10/31/2024 0225   RDW 11.8 11/08/2019 1403   RDW 15.4 (H) 11/28/2013 1148   LYMPHSABS 0.4 (L) 10/29/2024 0530   LYMPHSABS 1.1 11/08/2019 1403   LYMPHSABS 0.8 (L) 11/28/2013 1148   MONOABS 0.8 10/29/2024 0530   MONOABS 0.9 11/28/2013 1148   EOSABS 0.1 10/29/2024 0530   EOSABS 0.0 11/08/2019 1403   BASOSABS 0.0 10/29/2024 0530   BASOSABS 0.0 11/08/2019 1403   BASOSABS 0.0 11/28/2013 1148    BMET    Component Value Date/Time   NA 146 (H) 10/31/2024 0225   NA 137 07/01/2024 1505   NA 139 04/22/2014 1028   K 3.9 10/31/2024 0225   K 4.1 04/22/2014 1028   CL 107 10/31/2024 0225   CO2 26 10/31/2024 0225   CO2 23 04/22/2014 1028   GLUCOSE 124 (H) 10/31/2024 0225   GLUCOSE 81 04/22/2014 1028   BUN 49 (H) 10/31/2024 0225   BUN 20 07/01/2024 1505   BUN 8.8 04/22/2014  1028   CREATININE 2.19 (H) 10/31/2024 0225   CREATININE 0.94 12/10/2021 1532   CREATININE 0.8 04/22/2014 1028   CALCIUM  7.6 (L) 10/31/2024 0225   CALCIUM  9.1 04/22/2014 1028   EGFR 38 (L) 07/01/2024 1505   GFRNONAA 24 (L) 10/31/2024 0225    IMAGING past 24 hours No results found.   Vitals:   10/31/24 1156 10/31/24 1200 10/31/24 1254 10/31/24 1300  BP:  (!) 174/63  (!) 158/58  Pulse: 84 88 100 87  Resp: (!) 22 (!) 23 (!) 25 (!) 24  Temp: (!) 100.4 F (38 C)     TempSrc: Axillary     SpO2: 97% 97% 97% 96%  Weight:      Height:          Physical Exam  Constitutional: NAD  Eyes: No scleral injection.  HENT: ETT in place Head: Normocephalic.  Cardiovascular: Normal rate and regular rhythm.  Respiratory: Mechanically ventilated on weaning mode, respirations synchronous with ventilator Skin: WDI.   Neuro (on no sedation): Mental Status: Patient is unresponsive, no verbal output Following does not follow commands but grimaces to sternal rub Cranial Nerves: II: PERRL - 3mm brisk, corneal reflexes intact III,IV,  VI: Eyes midline, oculocephalic reflex intact, tracks to the left but not the right VII: Facial movement is obscured by ETT X: Cough  intact Motor: Tone is normal. Bulk is normal.  Right arm and leg with spontaneous movement more than left but no reaction to painful stimuli  No movement in bilateral lower extremities  Sensory: Grimaces slightly to sternal rub Cerebellar: Unable to perform    ASSESSMENT/PLAN  [name] is a 68 y.o. female with history of coronary artery disease, hypertension, hyperlipidemia, squamous cell carcinoma of the anus-admitted to the ICU was in the hospital for management of hemorrhagic shock with concern for DIC.  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  NIH on Admission 30  Stroke:  Bilateral areas of acute infarct in the  cerebrum and cerebellum  Etiology likely due to profound intracranial hypoperfusion due to severe anemia and hypovolemic shock CT head - Interval development of multiple cortical and subcortical edema areas involving bilateral cerebral and cerebellar hemispheres. These areas are highly suggestive of embolic infarcts MRI  Numerous scattered areas of restricted diffusion throughout the cerebral hemispheres and cerebellum concerning for acute infarcts of embolic etiology. MRA  2 mm inferiorly directed outpouching along the right supraclinoid ICA, most consistent with an infundibulum at the posterior communicating artery origin. A small aneurysm is less likely but not excluded. 2D Echo EF 60-65% 12/8- EEG- ABNORMALITY - Continuous slow, generalized IMPRESSION: This study is suggestive of generalized cerebral dysfunction ( encephalopathy). No seizures or epileptiform discharges were seen throughout the recording.  LDL 13 HgbA1c 4.9 VTE prophylaxis - Heparin  subcu No antithrombotic prior to admission, now on aspirin  81 mg daily  Therapy recommendations:  LTAC Disposition: Pending  S/p hernia repair 12/3 S/p ex lap 124, 12/5 S/p mesenteric artery stenting 10/14 As needed fentanyl , oxycodone  for pain management CCS following  Hemorrhagic shock Hx Hypertension Home meds:  Coreg , hydralazine , norvasc -on hold Stable BP goal normotensive (less than systolic 180, MAP goal 65) Episode of hypotension post op- found to have active arterial bleed 4U PRBCs, 1U FFP, 1U Cryo, IVF Off vasopressors Avoid low BP As needed labetalol  and hydralazine  for hypertension  Respiratory Failure Remain intubated postprocedure PCCM primary SBT/SAT  VAP protocol  Propofol  off 12/6; last fentanyl  bolus 12/10 at 0332 Oxycodone  IR at 8am  Dysphagia Patient has post-stroke dysphagia, SLP consulted N.p.o. On tube feeding @50   Other stroke risk factors Advanced age  Other medical issues AKI, Cre  2.88--2.65--2.43--2.26--2.19, nephro on board Hypokalemia, supplement  Hospital day # 8   Patient seen and examined by NP/APP with MD. MD to update note as needed.   Erin [name] Erin [name] , Erin [name], Erin [name] [facility] See Amion for schedule and pager information 10/31/2024 1:21 PM  ATTENDING NOTE: I reviewed above note and agree with the assessment and plan. Pt was seen and examined.   Husband at bedside.  Patient still intubated, on weaning trial, seems to have mild labored breathing and intermittent coughing.  Open eyes on voice, tracking more on the left side, however still not follow commands, no significant strength improvement except able to move her neck and left lower extremity slightly spontaneously.  Prognosis guarded, palliative care on board.  Recommend to allow more days for outcome, if no significant improvement over the weekend, may consider GOC discussion. I had long discussion with husband at bedside, updated pt current condition, treatment plan and potential prognosis, and  answered all the questions.  He expressed understanding and appreciation.    For detailed assessment and plan, please refer to above as I have made changes wherever appropriate.   Ary Cummins, MD PhD Stroke Neurology 10/31/2024 5:24 PM  This patient is critically ill due to bilateral numerous cerebral and cerebellar infarcts, respiratory failure, severe anemia and hypovolemic shock and at significant risk of neurological worsening, death form recurrent stroke, hemorrhagic admission, sepsis, septic shock. This patient's care requires constant monitoring of vital signs, hemodynamics, respiratory and cardiac monitoring, review of multiple databases, neurological assessment, discussion with family, other specialists and medical decision making of high complexity. I spent 35 minutes of neurocritical care time in the care of this patient. I had long discussion with husband at bedside, updated pt  current condition, treatment plan and potential prognosis, and answered all the questions.  He expressed understanding and appreciation.   To contact Stroke Continuity provider, please refer to [email or web address]. After hours, contact General Neurology

## 2024-11-01 ENCOUNTER — Inpatient Hospital Stay (HOSPITAL_COMMUNITY)

## 2024-11-01 DIAGNOSIS — N170 Acute kidney failure with tubular necrosis: Secondary | ICD-10-CM

## 2024-11-01 DIAGNOSIS — Z87448 Personal history of other diseases of urinary system: Secondary | ICD-10-CM

## 2024-11-01 DIAGNOSIS — I10 Essential (primary) hypertension: Secondary | ICD-10-CM

## 2024-11-01 DIAGNOSIS — I639 Cerebral infarction, unspecified: Secondary | ICD-10-CM | POA: Diagnosis not present

## 2024-11-01 DIAGNOSIS — Z8719 Personal history of other diseases of the digestive system: Secondary | ICD-10-CM | POA: Diagnosis not present

## 2024-11-01 DIAGNOSIS — J189 Pneumonia, unspecified organism: Secondary | ICD-10-CM | POA: Diagnosis not present

## 2024-11-01 DIAGNOSIS — J9601 Acute respiratory failure with hypoxia: Secondary | ICD-10-CM | POA: Diagnosis not present

## 2024-11-01 LAB — CBC
HCT: 26.2 % — ABNORMAL LOW (ref 36.0–46.0)
Hemoglobin: 8.3 g/dL — ABNORMAL LOW (ref 12.0–15.0)
MCH: 29.9 pg (ref 26.0–34.0)
MCHC: 31.7 g/dL (ref 30.0–36.0)
MCV: 94.2 fL (ref 80.0–100.0)
Platelets: 252 K/uL (ref 150–400)
RBC: 2.78 MIL/uL — ABNORMAL LOW (ref 3.87–5.11)
RDW: 15.9 % — ABNORMAL HIGH (ref 11.5–15.5)
WBC: 14.8 K/uL — ABNORMAL HIGH (ref 4.0–10.5)
nRBC: 0 % (ref 0.0–0.2)

## 2024-11-01 LAB — BASIC METABOLIC PANEL WITH GFR
Anion gap: 10 (ref 5–15)
BUN: 46 mg/dL — ABNORMAL HIGH (ref 8–23)
CO2: 24 mmol/L (ref 22–32)
Calcium: 7.3 mg/dL — ABNORMAL LOW (ref 8.9–10.3)
Chloride: 107 mmol/L (ref 98–111)
Creatinine, Ser: 2.02 mg/dL — ABNORMAL HIGH (ref 0.44–1.00)
GFR, Estimated: 26 mL/min — ABNORMAL LOW (ref >60)
Glucose, Bld: 145 mg/dL — ABNORMAL HIGH (ref 70–99)
Potassium: 3 mmol/L — ABNORMAL LOW (ref 3.5–5.1)
Sodium: 141 mmol/L (ref 135–145)

## 2024-11-01 LAB — GLUCOSE, CAPILLARY
Glucose-Capillary: 114 mg/dL — ABNORMAL HIGH (ref 70–99)
Glucose-Capillary: 117 mg/dL — ABNORMAL HIGH (ref 70–99)
Glucose-Capillary: 124 mg/dL — ABNORMAL HIGH (ref 70–99)
Glucose-Capillary: 131 mg/dL — ABNORMAL HIGH (ref 70–99)
Glucose-Capillary: 133 mg/dL — ABNORMAL HIGH (ref 70–99)
Glucose-Capillary: 154 mg/dL — ABNORMAL HIGH (ref 70–99)

## 2024-11-01 LAB — MAGNESIUM: Magnesium: 1.5 mg/dL — ABNORMAL LOW (ref 1.7–2.4)

## 2024-11-01 LAB — PHOSPHORUS: Phosphorus: 2.4 mg/dL — ABNORMAL LOW (ref 2.5–4.6)

## 2024-11-01 MED ORDER — FREE WATER
200.0000 mL | Status: DC
  Administered 2024-11-01 – 2024-11-02 (×6): 200 mL

## 2024-11-01 MED ORDER — POTASSIUM CHLORIDE 10 MEQ/100ML IV SOLN
10.0000 meq | INTRAVENOUS | Status: AC
  Administered 2024-11-01 (×3): 10 meq via INTRAVENOUS
  Filled 2024-11-01 (×3): qty 100

## 2024-11-01 MED ORDER — PIPERACILLIN-TAZOBACTAM 3.375 G IVPB
3.3750 g | Freq: Three times a day (TID) | INTRAVENOUS | Status: AC
  Administered 2024-11-01 – 2024-11-03 (×8): 3.375 g via INTRAVENOUS
  Filled 2024-11-01 (×10): qty 50

## 2024-11-01 MED ORDER — POTASSIUM PHOSPHATES 15 MMOLE/5ML IV SOLN
15.0000 mmol | Freq: Once | INTRAVENOUS | Status: AC
  Administered 2024-11-01: 15 mmol via INTRAVENOUS
  Filled 2024-11-01: qty 5

## 2024-11-01 MED ORDER — MAGNESIUM SULFATE 4 GM/100ML IV SOLN
4.0000 g | Freq: Once | INTRAVENOUS | Status: AC
  Administered 2024-11-01: 4 g via INTRAVENOUS
  Filled 2024-11-01: qty 100

## 2024-11-01 NOTE — Plan of Care (Signed)
°  Problem: SLP Dysphagia Goals Goal: Patient will demonstrate readiness for PO's Description: Patient will demonstrate readiness for PO's and/or instrumental swallow study as evidenced by: Flowsheets (Taken 11/01/2024 1558) Patient will demonstrate readiness for PO's and/or instrumental swallow study as evidenced by:  ability to manage secretions  initiation of swallow sequence   Problem: SLP Language Goals Goal: Patient will communicate needs/wants with Flowsheets (Taken 11/01/2024 1559) Patient will communicate ____  needs/wants with:  mod assist  basic Goal: Patient will follow commands during a functional ADL with Flowsheets (Taken 11/01/2024 1559) Patient will follow ____ commands during a functional ADL with ____:  1 step  mod assist

## 2024-11-01 NOTE — Progress Notes (Signed)
 Occupational Therapy Treatment Patient Details Name: Erin [name] MRN: [id number] DOB: February 12, 1956 Today's Date: 11/01/2024   History of present illness 68 y.o. female presents to [facility] hospital on 10/29/2024 as a transfer from [place]. Pt underwent ventral hernia repair on 12/3. Pt required return to OR on 12/4 for ex-lap with finding of abdominal hemorrhage, remaining intubated post-procedure. OR 12/5 for removal of packing and abdominal closure. MRI on 12/8 concerning for multiple embolic infarcts. PMH includes anal cancer, hypothyroidism, GERD, CAD, cardiomyopathy, PVD, HLD, HTN, RLS.   OT comments  This 68 yo female had her eyes opened looking at a visitor when I entered. Per husband she smiled at her visitor and she did smile at me x2. Pt not attempting to use her RUE to hold brush, washcloth, or lip balm for grooming activities at bed level (HOB up)--did hand over hand with her. She did regard me on the right 3 times with eye to eye contact and appeared really interested when talking about her dog Kiki. She will continue to benefit from acute OT with follow up at [facility].      If plan is discharge home, recommend the following:  Other (comment) (pt is total-total care +2 for all mobility, self care, and cognitive tasks)   Equipment Recommendations  Hospital bed;Hoyer lift;Wheelchair (measurements OT);Wheelchair cushion (measurements OT)       Precautions / Restrictions Precautions Precautions: Fall Recall of Precautions/Restrictions: Impaired Precaution/Restrictions Comments: cortrak, rectal pouch Restrictions Weight Bearing Restrictions Per Provider Order: No              ADL either performed or assessed with clinical judgement   ADL Overall ADL's : Needs assistance/impaired     Grooming: Wash/dry face;Brushing hair;Total assistance;Bed level Grooming Details (indicate cue type and reason): applying lip balm; hand over hand with RUE                                     Extremity/Trunk Assessment Upper Extremity Assessment Upper Extremity Assessment: RUE deficits/detail;LUE deficits/detail RUE Deficits / Details: Full PROM, no active movements noted, no spasticity seen today LUE Deficits / Details: Full PROM, no active movements noted, no spasticity seen today            Vision   Additional Comments: Pt did regard me at times with eye to eye contact when spoken to (3 out of 5 times on her right side)         Communication Communication Communication: Impaired Factors Affecting Communication: Difficulty expressing self (some gutteral utterances spontaneously--low tone)   Cognition Arousal: Alert (50% of time) Behavior During Therapy: Flat affect (Did get 2 smiles out of her) Cognition: Difficult to assess Difficult to assess due to: Level of arousal           OT - Cognition Comments: Pt sometimes opening her eyes to her name called and sometimes with sternal rub (keeps eyes open 3-5 minutes at a time); asking pt she shook her head yes to several questions that was appropriated, but when I started to ask her some No questions she closed her eyes.                 Following commands: Impaired (Not following any commands)        Cueing   Cueing Techniques: Tactile cues, Visual cues, Gestural cues, Verbal cues  Exercises Other Exercises Other Exercises: Husband asked what he can be  doing when therapy isn't there. I told him he can try the same things I did this session--hand over hand with brushing hair, washing face, putting on lip balm. Also ask her yes and no questions to see if you get two different nodding answers.            Pertinent Vitals/ Pain       Pain Assessment Pain Assessment: CPOT Facial Expression: Relaxed, neutral Body Movements: Absence of movements Muscle Tension: Relaxed Vocalization (extubated pts.): N/A         Frequency  Min 2X/week        Progress Toward Goals  OT  Goals(current goals can now be found in the care plan section)  Progress towards OT goals: Not progressing toward goals - comment (slow progress)  Acute Rehab OT Goals Patient Stated Goal: unable to state due to decreased communication due to stroke OT Goal Formulation: Patient unable to participate in goal setting Time For Goal Achievement: 11/13/24 Potential to Achieve Goals: Fair         AM-PAC OT 6 Clicks Daily Activity     Outcome Measure   Help from another person eating meals?: Total Help from another person taking care of personal grooming?: Total Help from another person toileting, which includes using toliet, bedpan, or urinal?: Total Help from another person bathing (including washing, rinsing, drying)?: Total Help from another person to put on and taking off regular upper body clothing?: Total Help from another person to put on and taking off regular lower body clothing?: Total 6 Click Score: 6    End of Session    OT Visit Diagnosis: Muscle weakness (generalized) (M62.81);Other symptoms and signs involving cognitive function;Cognitive communication deficit (R41.841) Symptoms and signs involving cognitive functions: Cerebral infarction   Activity Tolerance Patient tolerated treatment well (Did drift off in and out several times)   Patient Left in bed;with call bell/phone within reach;with bed alarm set;with family/visitor present           Time: 8766-8743 OT Time Calculation (min): 23 min  Charges: OT General Charges $OT Visit: 1 Visit OT Treatments $Self Care/Home Management : 23-37 mins  Erin [name] OT Acute Rehabilitation Services Office [phone]    Erin [name] 11/01/2024, 1:44 PM

## 2024-11-01 NOTE — Progress Notes (Signed)
 eLink Physician-Brief Progress Note Patient Name: Erin [name] DOB: 01/04/1956 MRN: [id number]   Date of Service  11/01/2024  HPI/Events of Note  K+ 3.0,  Mg++ 1.5  eICU Interventions  Electrolytes replaced per protocol.        Nadiyah Zeis U Solaris Kram 11/01/2024, 6:29 AM

## 2024-11-01 NOTE — Procedures (Signed)
 Cortrak  Tube Type:  Cortrak - 43 inches Tube Location:  Left nare Secured by: Bridle Initial Placement:  Gastric Technique Used to Measure Tube Placement:  Marking at nare/corner of mouth Cortrak Secured At:  68 cm   Cortrak Tube Team Note:  Consult received to place a Cortrak feeding tube.   No x-ray is required. RN may begin using tube.   If the tube becomes dislodged please keep the tube and contact the Cortrak team at [email or web address] for replacement.  If after hours and replacement cannot be delayed, place a NG tube and confirm placement with an abdominal x-ray.    Augustin Shams MS, RD, LDN If unable to be reached, please send secure chat to RD inpatient available from 8:00a-4:00p daily

## 2024-11-01 NOTE — Evaluation (Signed)
 Speech Language Pathology Evaluation Patient Details Name: Erin [name] MRN: [id number] DOB: [birth year] Today's Date: 11/01/2024 Time: 1136-1203 SLP Time Calculation (min) (ACUTE ONLY): 27 min  Problem List:  Patient Active Problem List   Diagnosis Date Noted   Malnutrition of moderate degree 10/25/2024   S/P hernia repair 10/23/2024   Shock (HCC) 10/23/2024   History of colonic polyps 08/29/2024   Renal artery stenosis 07/02/2024   AKI (acute kidney injury) 01/14/2024   Essential hypertension 09/25/2023   Restless leg syndrome 04/20/2023   Preoperative cardiovascular examination 05/10/2022   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/03/2020   Hyperlipidemia 02/12/2020   Cardiomyopathy (HCC) 11/03/2017   Peripheral vascular disease 11/03/2017   Coronary artery disease involving native coronary artery of native heart without angina pectoris 10/13/2017   Aortoiliac occlusive disease (HCC) 08/14/2017   Hypokalemia 06/06/2016   Family history of early CAD 04/08/2016   Elevated glucose 03/18/2016   B12 deficiency anemia 09/14/2015   Hypothyroidism 09/10/2015   GERD without esophagitis 09/10/2015   BPPV (benign paroxysmal positional vertigo) 07/08/2015   Neck pain 07/08/2015   Anal cancer (HCC) 08/19/2013   IBS (irritable bowel syndrome) 03/29/2013   Chronic daily headache 03/29/2013   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   Past Medical History:  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/14 60.4Gy/11fx and chemo   Anxiety    Aortoiliac occlusive disease (HCC) 08/14/2017   Arthritis    B12 deficiency anemia 09/14/2015   Blood transfusion without reported diagnosis    BPPV (benign paroxysmal positional vertigo) 07/08/2015   Cardiomyopathy (HCC) 11/03/2017   Chronic back  pain    Chronic daily headache 03/29/2013   takes bc powder   Closed right hip fracture (HCC) 09/10/2015   Coronary artery disease involving native coronary artery of native heart without angina pectoris 10/13/2017   DES to mid RCA   Depression    Essential (hemorrhagic) thrombocythemia (HCC) 09/06/2018   Essential hypertension 09/25/2023   Family history of early CAD 04/08/2016   GERD (gastroesophageal reflux disease)    History of hiatal hernia    Hot flashes    Hyperlipidemia    Hyperlipidemia associated with type 2 diabetes mellitus (HCC) 05/11/2017   Hypothyroidism 09/10/2015   IBS (irritable bowel syndrome) 03/29/2013   Neck pain 07/08/2015   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   takes neurotin   Peripheral vascular disease 11/03/2017   QT prolongation    Sacroiliitis 09/06/2018   Spasms of the hands or feet 10/08/2017   Syncope 06/07/2016   Tubular adenoma of colon 09/08/2003   Vertigo    Wears glasses    Past Surgical History:  Past Surgical History:  Procedure Laterality Date   ABDOMINAL AORTOGRAM N/A 08/02/2017   Procedure: ABDOMINAL AORTOGRAM;  Surgeon: Darron Deatrice LABOR, MD;  Location: [facility];  Service: Cardiovascular;  Laterality: N/A;   ABDOMINAL AORTOGRAM W/LOWER EXTREMITY Bilateral 09/03/2024   Procedure: ABDOMINAL AORTOGRAM W/LOWER EXTREMITY;  Surgeon: Serene Gaile ORN, MD;  Location: [facility];  Service: Cardiovascular;  Laterality: Bilateral;   AORTA - BILATERAL FEMORAL ARTERY BYPASS GRAFT N/A 08/14/2017   Procedure: AORTA BIFEMORAL BYPASS GRAFT;  Surgeon: Oris Krystal FALCON, MD;  Location[facility] OR;  Service: Vascular;  Laterality: N/A;   COLONOSCOPY     COLONOSCOPY N/A 08/29/2024  Procedure: COLONOSCOPY;  Surgeon: Wilhelmenia Aloha Raddle., MD;  Location: [facility];  Service: Gastroenterology;  Laterality: N/A;   CORONARY STENT INTERVENTION N/A 10/13/2017   Procedure: CORONARY STENT INTERVENTION;  Surgeon: Mady Bruckner, MD;  [facility];  Service: Cardiovascular;  Laterality: N/A;   dental implant     ECTOPIC PREGNANCY SURGERY     EMBOLECTOMY N/A 08/14/2017   Procedure: EMBOLECTOMY FEMORAL;  Surgeon: Oris Krystal FALCON, MD;  Location: [facility] OR;  Service: Vascular;  Laterality: N/A;   FEMORAL-POPLITEAL BYPASS GRAFT Right 08/14/2017   Procedure: Right Femoral to Above Knee Popliteal Bypass Graft using Non-Reversed Greater Saphenous Vein Graft from Right Leg;  Surgeon: Oris Krystal FALCON, MD;  Location: [facility] OR;  Service: Vascular;  Laterality: Right;   FLEXIBLE SIGMOIDOSCOPY N/A 08/14/2013   Procedure: FLEXIBLE SIGMOIDOSCOPY;  Surgeon: Gwendlyn ONEIDA Buddy, MD;  Location: [facility];  Service: Endoscopy;  Laterality: N/A;   LAPAROTOMY N/A 10/24/2024   Procedure: EXPLORATORY LAPAROTOMY, ABDOMINAL PACKING AND WOUND VAC APPLICATION;  Surgeon: Debby Hila, MD;  Location: [facility];  Service: General;  Laterality: N/A;  (8) EIGHT 18X18 LAP SPONGE PACKED   LAPAROTOMY N/A 10/25/2024   Procedure: LAPAROTOMY, EXPLORATORY;  Surgeon: Rubin Calamity, MD;  Location: [facility];  Service: General;  Laterality: N/A;  REMOVAL OF PACKING   LEFT HEART CATH AND CORONARY ANGIOGRAPHY N/A 10/13/2017   Procedure: LEFT HEART CATH AND CORONARY ANGIOGRAPHY;  Surgeon: Mady Bruckner, MD;  [facility];  Service: Cardiovascular;  Laterality: N/A;   LOWER EXTREMITY ANGIOGRAPHY Bilateral 08/02/2017   Procedure: Lower Extremity Angiography;  Surgeon: Darron Deatrice LABOR, MD;  Location: [facility] INVASIVE CV LAB;  Service: Cardiovascular;  Laterality: Bilateral;   LOWER EXTREMITY INTERVENTION N/A 09/03/2024   Procedure: LOWER EXTREMITY INTERVENTION;  Surgeon: Serene Gaile ORN, MD;  [facility];  Service: Cardiovascular;  Laterality: N/A;   MULTIPLE TOOTH EXTRACTIONS     PERIPHERAL VASCULAR INTERVENTION  05/22/2018   Procedure: PERIPHERAL VASCULAR INTERVENTION;  Surgeon: Serene Gaile ORN, MD;  [facility];  Service: Cardiovascular;;  SMA  and Celiac   PILONIDAL CYST EXCISION     POLYPECTOMY     THROMBECTOMY FEMORAL ARTERY Right 08/14/2017   Procedure: THROMBECTOMY FEMORAL ARTERY;  Surgeon: Oris Krystal FALCON, MD;  Location: [facility] OR;  Service: Vascular;  Laterality: Right;   TONSILLECTOMY     TOTAL HIP ARTHROPLASTY  09/11/2015   Procedure: TOTAL HIP ARTHROPLASTY;  Surgeon: Evalene JONETTA Chancy, MD;  Location: [facility];  Service: Orthopedics;;   ULTRASOUND GUIDANCE FOR VASCULAR ACCESS  10/13/2017   Procedure: Ultrasound Guidance For Vascular Access;  Surgeon: Mady Bruckner, MD;  [facility];  Service: Cardiovascular;;   VENTRAL HERNIA REPAIR N/A 10/23/2024   Procedure: REPAIR, HERNIA, VENTRAL, LAPAROSCOPIC;  Surgeon: Rubin Calamity, MD;  Location: [facility];  Service: General;  Laterality: N/A;   VISCERAL ANGIOGRAPHY N/A 03/03/2020   Procedure: MESTENRIC ANGIOGRAPHY;  Surgeon: Serene Gaile ORN, MD;  [facility];  Service: Cardiovascular;  Laterality: N/A;   VISCERAL ANGIOGRAPHY N/A 09/03/2024   Procedure: VISCERAL ANGIOGRAPHY;  Surgeon: Serene Gaile ORN, MD;  [facility];  Service: Cardiovascular;  Laterality: N/A;   VISCERAL ARTERY INTERVENTION N/A 09/03/2024   Procedure: VISCERAL ARTERY INTERVENTION;  Surgeon: Serene Gaile ORN, MD;  [facility];  Service: Cardiovascular;  Laterality: N/A;   HPI:  Mrs. Binegar is a 68 y.o. F  adm to [facility] on  12/3 for ventral hernia repair. 12/4 ex lap with abdominal hemorrhage and remained intubated post op. 12/5 abdominal closure. 12/8 MRI with multiple embolic infarcts. 12/9 transfer to [facility]. 12/11 extubated. PMHx: anal CA, hypothyroidism, GERD, CAD, cardiomyopathy, PVD, HLD, HTN, RLS. SLP consulted for cogntitive-linguistic assessment and clinical swallow assessment. Pt with a hx of mild pharyngeal dysphagia and suspected primary esophageal dysphagia per MBSS completed 05/06/22   Assessment / Plan / Recommendation Clinical Impression   Pt  presents with severe cognitive-linguistic deficits characterized by flat affect, reduced attention, inability to follow simple auditory commands, and limited verbalizations with limited communication attempts. Pt did stick out her tongue that was assumed to be an indication to offer her more ice chips during clinical swallow assessment. She did exhibit some lip movement in response to SLP annunciating bye at the end of the session. Spontaneous verbalizations included sighs or groans. No spoken verbalizations were observed today. Pt occasionally made eye contact with speaker, though not on command. Flat affect noted.   Pt will benefit from continued SLP services to address cognitive-linguistic goals. Pt will benefit from SLP services at next venue of care.     SLP Assessment  SLP Recommendation/Assessment: Patient needs continued Speech Language Pathology Services SLP Visit Diagnosis: Dysphagia, unspecified (R13.10);Cognitive communication deficit (R41.841);Aphasia (R47.01)     Assistance Recommended at Discharge  Frequent or constant Supervision/Assistance  Functional Status Assessment Patient has had a recent decline in their functional status and demonstrates the ability to make significant improvements in function in a reasonable and predictable amount of time.  Frequency and Duration min 2x/week  8 weeks      SLP Evaluation Cognition  Overall Cognitive Status: Impaired/Different from baseline Arousal/Alertness: Awake/alert Orientation Level:  (nonverbal) Attention: Focused Focused Attention: Impaired Focused Attention Impairment: Functional basic       Comprehension  Auditory Comprehension Overall Auditory Comprehension: Impaired Commands: Impaired One Step Basic Commands: 0-24% accurate    Expression Expression Primary Mode of Expression:  (nonverbal with some automatic gestures or movements) Verbal Expression Overall Verbal Expression: Impaired Initiation:  Impaired Pragmatics: Impairment Impairments: Abnormal affect Non-Verbal Means of Communication:  (emerging gestures ?) Other Verbal Expression Comments: stuck out tongue to indicate more ice chips   Oral / Motor  Oral Motor/Sensory Function Overall Oral Motor/Sensory Function:  (unable to complete assessment) Motor Speech Overall Motor Speech:  (unable to assess)            [name] 11/01/2024, 3:50 PM

## 2024-11-01 NOTE — Progress Notes (Signed)
 Physical Therapy Treatment Patient Details Name: Erin [name] MRN: [id number] DOB: January 27, 1956 Today's Date: 11/01/2024   History of Present Illness 68 y.o. female presents to [facility] hospital on 10/29/2024 as a transfer from [place]. Pt underwent ventral hernia repair on 12/3. Pt required return to OR on 12/4 for ex-lap with finding of abdominal hemorrhage, remaining intubated post-procedure. OR 12/5 for removal of packing and abdominal closure. MRI on 12/8 concerning for multiple embolic infarcts. PMH includes anal cancer, hypothyroidism, GERD, CAD, cardiomyopathy, PVD, HLD, HTN, RLS.    PT Comments  Pt with flat affect, eyes open and visually tracking throughout session. Pt with [place] out of nares on arrival and maintained 93% on RA throughout session. HR 64. Pt with withdrawal bil feet to touching and would assist with 1/5 activation to lift legs off bed. Pt with intermittent limited movement of bil UE to adjust trunk and facial expressions to signify discomfort or dislike to head positioning. Pt able to accept weight on legs in standing trials and remains total assist for all transfers. Spouse present throughout and educated for positioning and stimulating/moving pt extremities. Will continue to follow with LTAC appropriate at current level.     If plan is discharge home, recommend the following: Two people to help with walking and/or transfers;Two people to help with bathing/dressing/bathroom;Assistance with cooking/housework;Assistance with feeding;Direct supervision/assist for medications management;Direct supervision/assist for financial management;Assist for transportation;Help with stairs or ramp for entrance;Supervision due to cognitive status   Can travel by private vehicle        Equipment Recommendations  [place] lift;Hospital bed;Wheelchair (measurements PT);Wheelchair cushion (measurements PT)    Recommendations for Other Services       Precautions / Restrictions  Precautions Precautions: Fall;Other (comment) Recall of Precautions/Restrictions: Impaired Precaution/Restrictions Comments: cortrak, rectal pouch     Mobility  Bed Mobility Overal bed mobility: Needs Assistance Bed Mobility: Supine to Sit, Sit to Supine           General bed mobility comments: total assist of foot egress positioning to transition to full chair and back to supine. total +2 to slide to [facility]. total assist to slide hips out to EOB with pad in full chair position. Max assist for sitting balance with tendency for posterior right lean, trunk engagement intermittently    Transfers Overall transfer level: Needs assistance   Transfers: Sit to/from Stand Sit to Stand: Max assist           General transfer comment: max assist with knees blocked and sacral support to rise to standing x 3 trials, maintained trunk flexion    Ambulation/Gait               General Gait Details: unable   Stairs             Wheelchair Mobility     Tilt Bed    Modified Rankin (Stroke Patients Only) Modified Rankin (Stroke Patients Only) Pre-Morbid Rankin Score: No symptoms Modified Rankin: Severe disability     Balance Overall balance assessment: Needs assistance Sitting-balance support: No upper extremity supported, Feet unsupported Sitting balance-Leahy Scale: Zero Sitting balance - Comments: max assist EOB, intermittent a few seconds of  core engagement Postural control: Posterior lean, Right lateral lean Standing balance support: During functional activity Standing balance-Leahy Scale: Zero                              Communication Communication Communication: Impaired Factors Affecting Communication: Difficulty expressing  self  Cognition Arousal: Alert Behavior During Therapy: Flat affect   PT - Cognitive impairments: Difficult to assess Difficult to assess due to: Impaired communication                     PT - Cognition  Comments: pt will visually track and respond with hey on command 50% of the time, intermittent UB/LB movement Following commands: Impaired Following commands impaired: Follows one step commands inconsistently    Cueing Cueing Techniques: Tactile cues, Visual cues, Gestural cues, Verbal cues  Exercises      General Comments        Pertinent Vitals/Pain Pain Assessment Pain Assessment: CPOT Facial Expression: Relaxed, neutral Body Movements: Absence of movements Muscle Tension: Relaxed Compliance with ventilator (intubated pts.): N/A Vocalization (extubated pts.): Talking in normal tone or no sound CPOT Total: 0 Pain Intervention(s): Repositioned, Monitored during session    Home Living                          Prior Function            PT Goals (current goals can now be found in the care plan section) Progress towards PT goals: Progressing toward goals    Frequency    Min 2X/week      PT Plan      Co-evaluation              AM-PAC PT 6 Clicks Mobility   Outcome Measure  Help needed turning from your back to your side while in a flat bed without using bedrails?: Total Help needed moving from lying on your back to sitting on the side of a flat bed without using bedrails?: Total Help needed moving to and from a bed to a chair (including a wheelchair)?: Total Help needed standing up from a chair using your arms (e.g., wheelchair or bedside chair)?: Total Help needed to walk in hospital room?: Total Help needed climbing 3-5 steps with a railing? : Total 6 Click Score: 6    End of Session   Activity Tolerance: Patient tolerated treatment well Patient left: in bed;with call bell/phone within reach;with bed alarm set;with family/visitor present Nurse Communication: Mobility status;Need for lift equipment PT Visit Diagnosis: Other abnormalities of gait and mobility (R26.89);Muscle weakness (generalized) (M62.81);Other symptoms and signs involving  the nervous system (R29.898)     Time: 9146-9074 PT Time Calculation (min) (ACUTE ONLY): 32 min  Charges:    $Therapeutic Activity: 23-37 mins PT General Charges $$ ACUTE PT VISIT: 1 Visit                     Lenoard SQUIBB, PT Acute Rehabilitation Services Office: [phone]    Lenoard NOVAK Mirta Mally 11/01/2024, 9:34 AM

## 2024-11-01 NOTE — Progress Notes (Signed)
 NAME:  Erin [name], MRN:  [id number], DOB:  [birth year], LOS: 9 ADMISSION DATE:  10/23/2024, CONSULTATION DATE:  10/23/2024 REFERRING MD: Rubin - CCS, CHIEF COMPLAINT:  Hypotension   History of Present Illness:  68 year old woman who presented to [facility] 12/3 for elective hernia repair. 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  Postoperatively, patient remained hypotensive on pressors. Pt is laying in bed calling out in pain of legs and abdomen. Feels that she needs to keep her legs bent. Appears very pale in skin and mucosal membranes. Pt is oriented but has requested all questions are directed to her husband at this time. Pt does endorse dizziness, despite being supine and feeling weak and cold. Otherwise ROS negative or pt is not able to verbalize other symptoms.    Husband on the phone states that leg pain is chronic and typically has been 2/2 low mag and potassium for which she is reportedly on supplementation for. Husband also states that she recently (10/14) had mesenteric artery stenting and he is concerned that this is contributing to her presentation post operatively from the hernia repair. I have discussed with him plan for cvc, repeat labs and likely cta to eval for active bleed. He agrees with plan and consents to further blood product transfusion.     PCCM was asked to consult for hypotension.  Pertinent Medical History:  Kidney stent for stenosis Abdominal wall hernia present for about 2 years Bilateral femoral bypass surgery in 2014 History of mesenteric stent 09/09/2024   Significant Hospital Events: Including procedures, antibiotic start and stop dates in addition to other pertinent events   12/3 admitted to ICU postop 12/3-post hernia  repair 12/4 taken back to the OR for hemoperitoneum-active bleed was not identified, abdomen packed with wound VAC in place 12/5 back to the OR for an open abdomen-no evidence of ongoing bleeding and abdomen was closed 12/8 - CT Head with interval development (since 04/2024) of multiple cortical and subcortical hypodensities in bilateral cerebellar and cerebral hemispheres suggestive of acute/subacute infarcts. MRI Brain with numerous areas of restricted diffusion throughout the cerebral hemispheres/cerebellum c/f acute infarcts of embolic etiology, associated mild edema (R occipital lobe, L temporal lobe).  12/11 tolerating PSV 5/5 >> extubated  SUBJ :  Extubated yesterday and has done well If febrile, on 4 L nasal cannula 3.3 L urine output with Lasix  Objective:    Blood pressure (!) 154/73, pulse 60, temperature 98.3 F (36.8 C), temperature source Axillary, resp. rate (!) 25, height 5' 3 (1.6 m), weight 50.5 kg, SpO2 94%.    Vent Mode: PSV;CPAP FiO2 (%):  [30 %-36 %] 36 % PEEP:  [5 cmH20] 5 cmH20 Pressure Support:  [5 cmH20] 5 cmH20   Intake/Output Summary (Last 24 hours) at 11/01/2024 1034 Last data filed at 11/01/2024 0600 Gross per 24 hour  Intake 3250.62 ml  Output 3030 ml  Net 220.62 ml   Filed Weights   10/30/24 0500 10/31/24 0500 11/01/24 0500  Weight: 52 kg 49.4 kg 50.5 kg   Physical Examination:  General: Elderly appearing female on cannula, no distress, flat affect HEENT: [place]/AT, no JVD Neuro: Eyes open and tracks but does not follow commands, flaccid upper extremities CV: RRR, no MRG PULM: Decreased breath sounds bilateral, no accessory muscle use GI: Soft, non-distended. Midline wound  clean/dry/intact without erythema/drainage.  Extremities: Bilateral symmetric 1+ LE edema noted. Improved edema of bilateral hands. Skin: Warm/dry, no rashes.   Labs show hypokalemia, hypomagnesemia, stable leukocytosis, and anemia, improving renal function 46/2.0  Resolved  problems  Hemorrhagic shock, resolved Received blood products (4U PRBCs, 1U FFP, 1U Cryo). Concern for DIC Thrombocytopenia - resolved, platelets 21K on admission  Assessment and Plan:   Acute bilateral cerebral and cerebellar infarcts  CT Head with interval development (since 04/2024) of multiple cortical and subcortical hypodensities in bilateral cerebellar and cerebral hemispheres suggestive of acute/subacute infarcts. Concern for embolic strokes 2/2 hypotension in the setting of hemorrhagic shock versus DIC, hypoperfusion vs. hypercoagulability. MRI Brain with numerous areas of restricted diffusion throughout the cerebral hemispheres/cerebellum c/f acute infarcts of embolic etiology, associated mild edema (R occipital lobe, L temporal lobe)  - Stoke service following - Continue ASA 81mg  daily (ok per CCS) - Frequent neuro checks - Neuroprotective measures: HOB > 30 degrees, normoglycemia, normothermia, - PT/OT >> total assist    Hx HTN  - Continue Amlodipine , Carvedilol , Hydralazine  PRN, Labetalol  PRN,    S/p hernia repair, elective 12/3 S/p ex-lap 12/4, 12/5 - Postoperative management per CCS - Wound care per surgery   Acute kidney injury -improving, suspected ATN due to shock/contrast History of left renal artery stenosis -Good response to Lasix , hold today, repeat lites and diurese again tomorrow  Acute respiratory insufficiency , extubated 12/11 HCAP -Enterobacter 12/6  -Monitor respiratory status today -Continue Zosyn  for 7 days total  At risk for malnutrition - Appreciate RD assistance - TF per CCS -- Await SLP -she may need core track  GOC - FULL CODE at present - GOC discussed with family 12/11, they desire reintubation if needed  Critical care time: 31 min.      Harden Staff MD. DEBE. [place] Pager : [phone]  If no response to pager , please call [phone] until 7 pm After 7:00 pm call Elink  [phone]     11/01/2024, 10:34 AM

## 2024-11-01 NOTE — Progress Notes (Signed)
 BLE venous duplex has been completed.   Results can be found under chart review under CV PROC. 11/01/2024 5:11 PM Prentiss Hammett RVT, RDMS

## 2024-11-01 NOTE — Progress Notes (Addendum)
 STROKE TEAM PROGRESS NOTE    SIGNIFICANT HOSPITAL EVENTS 12/3 - hernia repair 12/4 taken back to the OR for hemoperitoneum-active bleed was not identified, abdomen packed with wound VAC in place 12/5 back to the OR for an open abdomen-no evidence of ongoing bleeding and abdomen was closed 12/8 - CT Head and MRI brain with bil strokes   INTERIM HISTORY/SUBJECTIVE Patient is seen in her room with family  at the bedside.    She is more responsive today and is able to track family around the room. Patient has NGT that will be changed to cortrak .  She is beginning to grunt in response to questions, agitation which is an improvement as she made no sounds previously.  OBJECTIVE  CBC    Component Value Date/Time   WBC 14.8 (H) 11/01/2024 0403   RBC 2.78 (L) 11/01/2024 0403   HGB 8.3 (L) 11/01/2024 0403   HGB 11.9 11/08/2019 1403   HGB 10.6 (L) 11/28/2013 1148   HCT 26.2 (L) 11/01/2024 0403   HCT 35.7 11/08/2019 1403   HCT 31.2 (L) 11/28/2013 1148   PLT 252 11/01/2024 0403   PLT 250 11/08/2019 1403   MCV 94.2 11/01/2024 0403   MCV 99 (H) 11/08/2019 1403   MCV 101.4 (H) 11/28/2013 1148   MCH 29.9 11/01/2024 0403   MCHC 31.7 11/01/2024 0403   RDW 15.9 (H) 11/01/2024 0403   RDW 11.8 11/08/2019 1403   RDW 15.4 (H) 11/28/2013 1148   LYMPHSABS 0.4 (L) 10/29/2024 0530   LYMPHSABS 1.1 11/08/2019 1403   LYMPHSABS 0.8 (L) 11/28/2013 1148   MONOABS 0.8 10/29/2024 0530   MONOABS 0.9 11/28/2013 1148   EOSABS 0.1 10/29/2024 0530   EOSABS 0.0 11/08/2019 1403   BASOSABS 0.0 10/29/2024 0530   BASOSABS 0.0 11/08/2019 1403   BASOSABS 0.0 11/28/2013 1148    BMET    Component Value Date/Time   NA 141 11/01/2024 0403   NA 137 07/01/2024 1505   NA 139 04/22/2014 1028   K 3.0 (L) 11/01/2024 0403   K 4.1 04/22/2014 1028   CL 107 11/01/2024 0403   CO2 24 11/01/2024 0403   CO2 23 04/22/2014 1028   GLUCOSE 145 (H) 11/01/2024 0403   GLUCOSE 81 04/22/2014 1028   BUN 46 (H) 11/01/2024 0403   BUN  20 07/01/2024 1505   BUN 8.8 04/22/2014 1028   CREATININE 2.02 (H) 11/01/2024 0403   CREATININE 0.94 12/10/2021 1532   CREATININE 0.8 04/22/2014 1028   CALCIUM  7.3 (L) 11/01/2024 0403   CALCIUM  9.1 04/22/2014 1028   EGFR 38 (L) 07/01/2024 1505   GFRNONAA 26 (L) 11/01/2024 0403    IMAGING past 24 hours DG Chest Port 1 View Result Date: 11/01/2024 CLINICAL DATA:  Shortness of breath EXAM: PORTABLE CHEST 1 VIEW COMPARISON:  Six days ago FINDINGS: The heart size and mediastinal contours are within normal limits. Both lungs are clear. The visualized skeletal structures are unremarkable. Nasogastric tube tip seen in proximal stomach. IMPRESSION: No active disease. Electronically Signed   By: Lynwood Landy Raddle M.D.   On: 11/01/2024 08:40     Vitals:   11/01/24 0900 11/01/24 0928 11/01/24 1114 11/01/24 1150  BP: (!) 154/73 (!) 154/73    Pulse: 90 60 78   Resp: (!) 25  19   Temp:    97.6 F (36.4 C)  TempSrc:    Axillary  SpO2: 90% 94% 95%   Weight:      Height:  Physical Exam  Constitutional: NAD  Eyes: No scleral injection.  HENT: ETT in place Head: Normocephalic.  Cardiovascular: Normal rate and regular rhythm.  Respiratory: tachypneic at times. Skin: WDI.   Neuro (on no sedation): Mental Status: Patient is awake, alert, opens eyes to stimulus. Able to track. People around the room. pt. Has began to grunt Does not follow commands but grimaces to sternal rub Cranial Nerves: II: PERRL - 3mm brisk, corneal reflexes intact III,IV, VI: Eyes midline, oculocephalic reflex intact, able to track VII: right facial droop X: Cough  intact Motor: Tone is normal. Bulk is normal.  Right arm and leg with spontaneous movement does not localize to pain No movement in bilateral lower extremities  Sensory: Grimaces to sternal rub Cerebellar: Unable to perform    ASSESSMENT/PLAN  Ms. NIKAYA NASBY is a 68 y.o. female with history of coronary artery disease,  hypertension, hyperlipidemia, squamous cell carcinoma of the anus-admitted to the ICU was in the hospital for management of hemorrhagic shock with concern for DIC.  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 These areas are highly suggestive of embolic infarcts MRI  Numerous scattered areas of restricted diffusion throughout the cerebral hemispheres and cerebellum concerning for acute infarcts of embolic etiology. MRA  2 mm inferiorly directed outpouching along the right supraclinoid ICA, most consistent with an infundibulum at the posterior communicating artery origin. A small aneurysm is less likely but not excluded. 2D Echo EF 60-65% 12/8- EEG- ABNORMALITY - Continuous slow, generalized IMPRESSION: This study is suggestive of generalized cerebral dysfunction ( encephalopathy). No seizures or epileptiform discharges were seen throughout the recording.  LDL 13 HgbA1c 4.9 VTE prophylaxis - Heparin  subcu No antithrombotic prior to admission, now on aspirin  81 mg daily  Therapy recommendations:  LTAC Disposition: Pending  S/p hernia repair 12/3 S/p ex lap 124, 12/5 S/p mesenteric artery stenting 10/14 As needed fentanyl , oxycodone  for pain management CCS following  Hemorrhagic shock ? DIC, ruled out Hx Hypertension Home meds:  Coreg , hydralazine , norvasc -on hold Stable BP goal normotensive (less than systolic 180, MAP goal 65) Episode of hypotension post op- found to have active arterial bleed 4U PRBCs, 1U FFP, 1U Cryo, IVF Off vasopressors Avoid low BP As needed  labetalol  and hydralazine  for hypertension  Respiratory Failure Was intubated for procedure PCCM primary Extubated 12/12 So far tolerating well  Dysphagia Patient has post-stroke dysphagia, SLP consulted N.p.o. On NG tube On tube feeding @50   Other stroke risk factors Advanced age  Other medical issues AKI, Cre 2.88--2.65--2.43--2.26--2.19--2.02, nephro on board Hypokalemia, supplement  Hospital day # 9   Patient seen and examined by NP/APP with MD. MD to update note as needed.   Harlene LOISE Pouch , MSN, FNP-C [facility] See Amion for schedule and pager information 11/01/2024 12:03 PM  ATTENDING NOTE: I reviewed above note and agree with the assessment and plan. Pt was seen and examined.   Husband at bedside.  Patient was extubated, so far tolerating well, still has NG tube for tube feeding.  Patient eyes open able to track bilaterally with stimulation, however still global aphasia, nonverbal and not following  commands.  Not consistently blinking to be described bilaterally , PERRL. No significant facial droop. Tongue protrusion not cooperative.  Bilateral upper extremity mild withdraw, bilateral lower extremity slightly withdraw to pain summation. Sensation, coordination and gait not tested.  For detailed assessment and plan, please refer to above as I have made changes wherever appropriate.   Ary Cummins, MD PhD Stroke Neurology 11/01/2024 4:31 PM  This patient is critically ill due to bilateral numerous cerebral and cerebellar infarcts, respiratory failure, severe anemia and hypovolemic shock and at significant risk of neurological worsening, death form recurrent stroke, hemorrhagic admission, sepsis, septic shock. This patient's care requires constant monitoring of vital signs, hemodynamics, respiratory and cardiac monitoring, review of multiple databases, neurological assessment, discussion with family, other specialists and medical decision making of high  complexity. I spent 35 minutes of neurocritical care time in the care of this patient. I had long discussion with husband at bedside, updated pt current condition, treatment plan and potential prognosis, and answered all the questions.  He expressed understanding and appreciation.  Discussed with CCM Dr. Jude.  To contact Stroke Continuity provider, please refer to [email or web address]. After hours, contact General Neurology

## 2024-11-01 NOTE — Plan of Care (Addendum)
 [id number] Staff, MD at bedside.  See provider note.  9144: PT at bedside.  See note.  1015:  Dr. Jerri at bedside.  Husband updated by provider.  See provider note.  1243:  OT at bedside.  See OT note.  1325:  Speech at bedside.  See order.  1536:  Small bore feeding tube inserted.    Problem: Education: Goal: Knowledge of General Education information will improve Description: Including pain rating scale, medication(s)/side effects and non-pharmacologic comfort measures Outcome: Not Progressing   Problem: Health Behavior/Discharge Planning: Goal: Ability to manage health-related needs will improve Outcome: Not Progressing   Problem: Clinical Measurements: Goal: Ability to maintain clinical measurements within normal limits will improve Outcome: Not Progressing Goal: Will remain free from infection Outcome: Not Progressing Goal: Diagnostic test results will improve Outcome: Not Progressing Goal: Respiratory complications will improve Outcome: Not Progressing Goal: Cardiovascular complication will be avoided Outcome: Not Progressing   Problem: Activity: Goal: Risk for activity intolerance will decrease Outcome: Not Progressing   Problem: Nutrition: Goal: Adequate nutrition will be maintained Outcome: Not Progressing   Problem: Coping: Goal: Level of anxiety will decrease Outcome: Not Progressing   Problem: Elimination: Goal: Will not experience complications related to bowel motility Outcome: Not Progressing Goal: Will not experience complications related to urinary retention Outcome: Not Progressing   Problem: Pain Managment: Goal: General experience of comfort will improve and/or be controlled Outcome: Not Progressing   Problem: Safety: Goal: Ability to remain free from injury will improve Outcome: Not Progressing   Problem: Skin Integrity: Goal: Risk for impaired skin integrity will decrease Outcome: Not Progressing   Problem: Activity: Goal: Ability to tolerate  increased activity will improve Outcome: Not Progressing   Problem: Respiratory: Goal: Ability to maintain a clear airway and adequate ventilation will improve Outcome: Not Progressing   Problem: Role Relationship: Goal: Method of communication will improve Outcome: Not Progressing   Problem: Education: Goal: Knowledge of disease or condition will improve Outcome: Not Progressing Goal: Knowledge of secondary prevention will improve (MUST DOCUMENT ALL) Outcome: Not Progressing Goal: Knowledge of patient specific risk factors will improve (DELETE if not current risk factor) Outcome: Not Progressing   Problem: Ischemic Stroke/TIA Tissue Perfusion: Goal: Complications of ischemic stroke/TIA will be minimized Outcome: Not Progressing   Problem: Coping: Goal: Will verbalize positive feelings about self Outcome: Not Progressing Goal: Will identify appropriate support needs Outcome: Not Progressing   Problem: Health Behavior/Discharge Planning: Goal: Ability to manage health-related needs will improve Outcome: Not Progressing Goal: Goals will be collaboratively established with patient/family Outcome: Not Progressing   Problem: Self-Care: Goal: Ability to participate in self-care as condition permits will improve Outcome: Not Progressing Goal: Verbalization of feelings and concerns over difficulty with self-care will improve Outcome: Not Progressing Goal: Ability to communicate needs accurately will improve Outcome: Not Progressing   Problem: Nutrition: Goal: Risk of aspiration will decrease Outcome: Not Progressing Goal: Dietary intake will improve Outcome: Not Progressing

## 2024-11-01 NOTE — Progress Notes (Signed)
 Reached out to Dr. Geralynn from Nephrology around 1345 to see if foley can be d/c since continue foley order auto-discontinues around 1500 today. Okay to pull Foley per Dr. Geralynn at 1400. Foley removed around 1600.

## 2024-11-01 NOTE — Evaluation (Signed)
 Clinical/Bedside Swallow Evaluation Patient Details  Name: Erin [name] MRN: [id number] Date of Birth: [birth year]  Today's Date: 11/01/2024 Time: SLP Start Time (ACUTE ONLY): 1136 SLP Stop Time (ACUTE ONLY): 1203 SLP Time Calculation (min) (ACUTE ONLY): 27 min  Past Medical History:  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/14 60.4Gy/44fx and chemo   Anxiety    Aortoiliac occlusive disease (HCC) 08/14/2017   Arthritis    B12 deficiency anemia 09/14/2015   Blood transfusion without reported diagnosis    BPPV (benign paroxysmal positional vertigo) 07/08/2015   Cardiomyopathy (HCC) 11/03/2017   Chronic back pain    Chronic daily headache 03/29/2013   takes bc powder   Closed right hip fracture (HCC) 09/10/2015   Coronary artery disease involving native coronary artery of native heart without angina pectoris 10/13/2017   DES to mid RCA   Depression    Essential (hemorrhagic) thrombocythemia (HCC) 09/06/2018   Essential hypertension 09/25/2023   Family history of early CAD 04/08/2016   GERD (gastroesophageal reflux disease)    History of hiatal hernia    Hot flashes    Hyperlipidemia    Hyperlipidemia associated with type 2 diabetes mellitus (HCC) 05/11/2017   Hypothyroidism 09/10/2015   IBS (irritable bowel syndrome) 03/29/2013   Neck pain 07/08/2015   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   takes neurotin   Peripheral vascular disease 11/03/2017   QT prolongation    Sacroiliitis 09/06/2018   Spasms of the hands or feet 10/08/2017   Syncope 06/07/2016   Tubular adenoma of colon 09/08/2003   Vertigo    Wears glasses    Past Surgical History:  Past Surgical History:  Procedure Laterality Date   ABDOMINAL AORTOGRAM N/A 08/02/2017   Procedure: ABDOMINAL AORTOGRAM;  Surgeon: Darron Deatrice LABOR, MD;  Location: [facility];  Service: Cardiovascular;  Laterality: N/A;    ABDOMINAL AORTOGRAM W/LOWER EXTREMITY Bilateral 09/03/2024   Procedure: ABDOMINAL AORTOGRAM W/LOWER EXTREMITY;  Surgeon: Serene Gaile ORN, MD;  Location: [facility];  Service: Cardiovascular;  Laterality: Bilateral;   AORTA - BILATERAL FEMORAL ARTERY BYPASS GRAFT N/A 08/14/2017   Procedure: AORTA BIFEMORAL BYPASS GRAFT;  Surgeon: Oris Krystal FALCON, MD;  Location: [facility] OR;  Service: Vascular;  Laterality: N/A;   COLONOSCOPY     COLONOSCOPY N/A 08/29/2024   Procedure: COLONOSCOPY;  Surgeon: Wilhelmenia Aloha Raddle., MD;  Location: [facility];  Service: Gastroenterology;  Laterality: N/A;   CORONARY STENT INTERVENTION N/A 10/13/2017   Procedure: CORONARY STENT INTERVENTION;  Surgeon: Mady Bruckner, MD;  Location: [facility];  Service: Cardiovascular;  Laterality: N/A;   dental implant     ECTOPIC PREGNANCY SURGERY     EMBOLECTOMY N/A 08/14/2017   Procedure: EMBOLECTOMY FEMORAL;  Surgeon: Oris Krystal FALCON, MD;  Location: [facility] OR;  Service: Vascular;  Laterality: N/A;   FEMORAL-POPLITEAL BYPASS GRAFT Right 08/14/2017   Procedure: Right Femoral to Above Knee Popliteal Bypass Graft using Non-Reversed Greater Saphenous Vein Graft from Right Leg;  Surgeon: Oris Krystal FALCON, MD;  Location: [facility] OR;  Service: Vascular;  Laterality: Right;   FLEXIBLE SIGMOIDOSCOPY N/A 08/14/2013   Procedure: FLEXIBLE SIGMOIDOSCOPY;  Surgeon: Gwendlyn ONEIDA Buddy, MD;  Location: [facility];  Service: Endoscopy;  Laterality: N/A;   LAPAROTOMY N/A 10/24/2024   Procedure: EXPLORATORY LAPAROTOMY, ABDOMINAL PACKING AND WOUND VAC APPLICATION;  Surgeon: Debby Hila, MD;  Location: [facility];  Service: General;  Laterality: N/A;  (8) EIGHT 18X18 LAP SPONGE PACKED   LAPAROTOMY N/A 10/25/2024   Procedure: LAPAROTOMY, EXPLORATORY;  Surgeon: Rubin Calamity, MD;  Location: [facility];  Service: General;  Laterality: N/A;  REMOVAL OF PACKING   LEFT HEART CATH AND CORONARY ANGIOGRAPHY N/A 10/13/2017   Procedure: LEFT HEART CATH AND CORONARY  ANGIOGRAPHY;  Surgeon: Mady Bruckner, MD;  [facility]: [facility];  Service: Cardiovascular;  Laterality: N/A;   LOWER EXTREMITY ANGIOGRAPHY Bilateral 08/02/2017   Procedure: Lower Extremity Angiography;  Surgeon: Darron Deatrice LABOR, MD;  Location: [facility] LAB;  Service: Cardiovascular;  Laterality: Bilateral;   LOWER EXTREMITY INTERVENTION N/A 09/03/2024   Procedure: LOWER EXTREMITY INTERVENTION;  Surgeon: Serene Gaile ORN, MD;  [facility]: [facility];  Service: Cardiovascular;  Laterality: N/A;   MULTIPLE TOOTH EXTRACTIONS     PERIPHERAL VASCULAR INTERVENTION  05/22/2018   Procedure: PERIPHERAL VASCULAR INTERVENTION;  Surgeon: Serene Gaile ORN, MD;  [facility]: [facility];  Service: Cardiovascular;;  SMA and Celiac   PILONIDAL CYST EXCISION     POLYPECTOMY     THROMBECTOMY FEMORAL ARTERY Right 08/14/2017   Procedure: THROMBECTOMY FEMORAL ARTERY;  Surgeon: Oris Krystal FALCON, MD;  Location: [facility] OR;  Service: Vascular;  Laterality: Right;   TONSILLECTOMY     TOTAL HIP ARTHROPLASTY  09/11/2015   Procedure: TOTAL HIP ARTHROPLASTY;  Surgeon: Evalene JONETTA Chancy, MD;  Location: [facility];  Service: Orthopedics;;   ULTRASOUND GUIDANCE FOR VASCULAR ACCESS  10/13/2017   Procedure: Ultrasound Guidance For Vascular Access;  Surgeon: Mady Bruckner, MD;  Location: [facility];  Service: Cardiovascular;;   VENTRAL HERNIA REPAIR N/A 10/23/2024   Procedure: REPAIR, HERNIA, VENTRAL, LAPAROSCOPIC;  Surgeon: Rubin Calamity, MD;  Location: [facility];  Service: General;  Laterality: N/A;   VISCERAL ANGIOGRAPHY N/A 03/03/2020   Procedure: MESTENRIC ANGIOGRAPHY;  Surgeon: Serene Gaile ORN, MD;  [facility]: [facility];  Service: Cardiovascular;  Laterality: N/A;   VISCERAL ANGIOGRAPHY N/A 09/03/2024   Procedure: VISCERAL ANGIOGRAPHY;  Surgeon: Serene Gaile ORN, MD;  [facility]: [facility];  Service: Cardiovascular;  Laterality: N/A;   VISCERAL ARTERY INTERVENTION N/A 09/03/2024    Procedure: VISCERAL ARTERY INTERVENTION;  Surgeon: Serene Gaile ORN, MD;  Location: [facility];  Service: Cardiovascular;  Laterality: N/A;   HPI:  Mrs. Petrak is a 68 y.o. F  adm to [facility] on 12/3 for ventral hernia repair. 12/4 ex lap with abdominal hemorrhage and remained intubated post op. 12/5 abdominal closure. 12/8 MRI with multiple embolic infarcts. 12/9 transfer to [facility]. 12/11 extubated. PMHx: anal CA, hypothyroidism, GERD, CAD, cardiomyopathy, PVD, HLD, HTN, RLS. SLP consulted for cogntitive-linguistic assessment and clinical swallow assessment. Pt with a hx of mild pharyngeal dysphagia and suspected primary esophageal dysphagia per MBSS completed 05/06/22    Assessment / Plan / Recommendation  Clinical Impression   Pt presents with mild-moderate oral dysphagia and clinical s/s of pharyngeal dysphagia per clinical swallow assessment completed today. Recommend continue NPO with the exception of ice chips for oral comfort to stimulate oropharyngeal swallow response. Recommend Q4 oral care. SLP will follow and recommend instrumental swallow assessment when pt is clinically ready.   Pt unable to follow commands, though did initiate oral acceptance when PO trials were offered.   Oral phase significant for reduced labial strength and coordination resulting in occasional anterior loss of ice chip. Pt did effectively initiate mastication efforts and oral transit.   Pharyngeal phase significant for multiple swallows  concerning for reduced pharyngeal stripping. There was delayed and immediate coughing instances following >80% of thin water  trials (by spoon). Increased RR intermittently observed with PO trials. No change in O2 sats noted.   Plan: SLP will follow up for ongoing clinical swallow assessment and proceed with MBSS or FEES when pt is clinically ready.  SLP Visit Diagnosis: Dysphagia, unspecified (R13.10);Cognitive communication deficit (R41.841);Aphasia (R47.01)    Aspiration Risk   Moderate aspiration risk    Diet Recommendation NPO;Ice chips PRN after oral care    Medication Administration: Via alternative means Supervision: Full supervision/cueing for compensatory strategies Compensations: Slow rate;Minimize environmental distractions Postural Changes: Seated upright at 90 degrees    Other Recommendations Oral Care Recommendations: Oral care QID;Oral care prior to ice chip/H20      Assistance Recommended at Discharge Frequent or constant Supervision/Assistance  Functional Status Assessment Patient has had a recent decline in their functional status and demonstrates the ability to make significant improvements in function in a reasonable and predictable amount of time.  Frequency and Duration min 2x/week  4 weeks       Prognosis Prognosis for improved oropharyngeal function: Fair Barriers to Reach Goals: Cognitive deficits;Severity of deficits      Swallow Study   General Date of Onset: 11/01/24 HPI: Mrs. Pronovost is a 68 y.o. F  adm to [facility] on 12/3 for ventral hernia repair. 12/4 ex lap with abdominal hemorrhage and remained intubated post op. 12/5 abdominal closure. 12/8 MRI with multiple embolic infarcts. 12/9 transfer to [facility]. 12/11 extubated. PMHx: anal CA, hypothyroidism, GERD, CAD, cardiomyopathy, PVD, HLD, HTN, RLS. SLP consulted for cogntitive-linguistic assessment and clinical swallow assessment. Pt with a hx of mild pharyngeal dysphagia and suspected primary esophageal dysphagia per MBSS completed 05/06/22 Type of Study: Bedside Swallow Evaluation Previous Swallow Assessment: MBS 05/06/22 with recommendations for a regular diet and thin liquids Diet Prior to this Study: NPO Temperature Spikes Noted: No Respiratory Status: Room air History of Recent Intubation: Yes Total duration of intubation (days): 8 days Date extubated: 10/31/24 Behavior/Cognition: Alert;Doesn't follow directions Oral Cavity Assessment:  (limited assessment) Oral Care Completed  by SLP: Recent completion by staff Oral Cavity - Dentition: Missing dentition (has dentures for lower dentition, though not in place; natural upper dentition) Vision: Impaired for self-feeding Self-Feeding Abilities: Total assist Patient Positioning: Upright in bed Baseline Vocal Quality:  (limited assessment, though voice relatively clear during spontaneous grunts) Volitional Cough: Cognitively unable to elicit Volitional Swallow: Unable to elicit    Oral/Motor/Sensory Function Overall Oral Motor/Sensory Function:  (unable to assess)   Ice Chips Ice chips: Impaired Oral Phase Impairments: Reduced labial seal Oral Phase Functional Implications: Right anterior spillage (occasional anterior loss) Pharyngeal Phase Impairments: Multiple swallows   Thin Liquid Thin Liquid: Impaired Presentation: Spoon Oral Phase Impairments: Reduced labial seal Pharyngeal  Phase Impairments: Multiple swallows;Cough - Immediate;Cough - Delayed    Nectar Thick Nectar Thick Liquid: Not tested   Honey Thick Honey Thick Liquid: Not tested   Puree Puree: Not tested   Solid     Solid: Not tested      [name] 11/01/2024,4:18 PM

## 2024-11-02 DIAGNOSIS — N179 Acute kidney failure, unspecified: Secondary | ICD-10-CM | POA: Diagnosis not present

## 2024-11-02 DIAGNOSIS — Z87448 Personal history of other diseases of urinary system: Secondary | ICD-10-CM | POA: Diagnosis not present

## 2024-11-02 DIAGNOSIS — J9601 Acute respiratory failure with hypoxia: Secondary | ICD-10-CM | POA: Diagnosis not present

## 2024-11-02 DIAGNOSIS — I6340 Cerebral infarction due to embolism of unspecified cerebral artery: Secondary | ICD-10-CM | POA: Diagnosis not present

## 2024-11-02 DIAGNOSIS — Z9889 Other specified postprocedural states: Secondary | ICD-10-CM | POA: Diagnosis not present

## 2024-11-02 DIAGNOSIS — R571 Hypovolemic shock: Secondary | ICD-10-CM | POA: Diagnosis not present

## 2024-11-02 DIAGNOSIS — D649 Anemia, unspecified: Secondary | ICD-10-CM | POA: Diagnosis not present

## 2024-11-02 DIAGNOSIS — Z8719 Personal history of other diseases of the digestive system: Secondary | ICD-10-CM | POA: Diagnosis not present

## 2024-11-02 DIAGNOSIS — I63443 Cerebral infarction due to embolism of bilateral cerebellar arteries: Secondary | ICD-10-CM | POA: Diagnosis not present

## 2024-11-02 DIAGNOSIS — J189 Pneumonia, unspecified organism: Secondary | ICD-10-CM | POA: Diagnosis not present

## 2024-11-02 DIAGNOSIS — I10 Essential (primary) hypertension: Secondary | ICD-10-CM | POA: Diagnosis not present

## 2024-11-02 DIAGNOSIS — I639 Cerebral infarction, unspecified: Secondary | ICD-10-CM | POA: Diagnosis not present

## 2024-11-02 DIAGNOSIS — N170 Acute kidney failure with tubular necrosis: Secondary | ICD-10-CM | POA: Diagnosis not present

## 2024-11-02 DIAGNOSIS — G9340 Encephalopathy, unspecified: Secondary | ICD-10-CM | POA: Diagnosis not present

## 2024-11-02 DIAGNOSIS — I69391 Dysphagia following cerebral infarction: Secondary | ICD-10-CM | POA: Diagnosis not present

## 2024-11-02 DIAGNOSIS — G9389 Other specified disorders of brain: Secondary | ICD-10-CM | POA: Diagnosis not present

## 2024-11-02 LAB — GLUCOSE, CAPILLARY
Glucose-Capillary: 105 mg/dL — ABNORMAL HIGH (ref 70–99)
Glucose-Capillary: 106 mg/dL — ABNORMAL HIGH (ref 70–99)
Glucose-Capillary: 108 mg/dL — ABNORMAL HIGH (ref 70–99)
Glucose-Capillary: 109 mg/dL — ABNORMAL HIGH (ref 70–99)
Glucose-Capillary: 112 mg/dL — ABNORMAL HIGH (ref 70–99)
Glucose-Capillary: 99 mg/dL (ref 70–99)

## 2024-11-02 LAB — BASIC METABOLIC PANEL WITH GFR
Anion gap: 8 (ref 5–15)
BUN: 39 mg/dL — ABNORMAL HIGH (ref 8–23)
CO2: 23 mmol/L (ref 22–32)
Calcium: 7.4 mg/dL — ABNORMAL LOW (ref 8.9–10.3)
Chloride: 106 mmol/L (ref 98–111)
Creatinine, Ser: 1.68 mg/dL — ABNORMAL HIGH (ref 0.44–1.00)
GFR, Estimated: 33 mL/min — ABNORMAL LOW (ref >60)
Glucose, Bld: 119 mg/dL — ABNORMAL HIGH (ref 70–99)
Potassium: 3.3 mmol/L — ABNORMAL LOW (ref 3.5–5.1)
Sodium: 137 mmol/L (ref 135–145)

## 2024-11-02 LAB — GASTROINTESTINAL PANEL BY PCR, STOOL (REPLACES STOOL CULTURE)

## 2024-11-02 LAB — PHOSPHORUS: Phosphorus: 3.2 mg/dL (ref 2.5–4.6)

## 2024-11-02 LAB — CBC
HCT: 26.7 % — ABNORMAL LOW (ref 36.0–46.0)
Hemoglobin: 8.9 g/dL — ABNORMAL LOW (ref 12.0–15.0)
MCH: 31 pg (ref 26.0–34.0)
MCHC: 33.3 g/dL (ref 30.0–36.0)
MCV: 93 fL (ref 80.0–100.0)
Platelets: 310 K/uL (ref 150–400)
RBC: 2.87 MIL/uL — ABNORMAL LOW (ref 3.87–5.11)
RDW: 15.6 % — ABNORMAL HIGH (ref 11.5–15.5)
WBC: 13.4 K/uL — ABNORMAL HIGH (ref 4.0–10.5)
nRBC: 0 % (ref 0.0–0.2)

## 2024-11-02 LAB — C DIFFICILE (CDIFF) QUICK SCRN (NO PCR REFLEX)
C Diff antigen: NEGATIVE
C Diff interpretation: NOT DETECTED
C Diff toxin: NEGATIVE

## 2024-11-02 LAB — MAGNESIUM: Magnesium: 2.1 mg/dL (ref 1.7–2.4)

## 2024-11-02 MED ORDER — CALCIUM GLUCONATE-NACL 1-0.675 GM/50ML-% IV SOLN
1.0000 g | Freq: Once | INTRAVENOUS | Status: AC
  Administered 2024-11-02: 1000 mg via INTRAVENOUS
  Filled 2024-11-02: qty 50

## 2024-11-02 MED ORDER — FREE WATER
200.0000 mL | Freq: Three times a day (TID) | Status: DC
  Administered 2024-11-02 – 2024-11-11 (×27): 200 mL

## 2024-11-02 MED ORDER — POTASSIUM CHLORIDE 20 MEQ PO PACK
40.0000 meq | PACK | Freq: Once | ORAL | Status: AC
  Administered 2024-11-02: 40 meq
  Filled 2024-11-02: qty 2

## 2024-11-02 NOTE — Progress Notes (Addendum)
 eLink Physician-Brief Progress Note Patient Name: Erin [name] DOB: 1956-04-19 MRN: [id number]   Date of Service  11/02/2024  HPI/Events of Note  Patient with copious, foul smelling diarrhea.  eICU Interventions  [facility] [employer] ordered.        Misaki Sozio U Shakeita Vandevander 11/02/2024, 12:33 AM

## 2024-11-02 NOTE — Progress Notes (Signed)
 Dr. Alva at bedside and aware that patient not opening eyes this morning. Responds to pain. Per night shift RN patient opening eyes, tracking and occasionally followed commands. Per night shift RN patient was awake most of night and fell asleep around 0400. MD aware of all mentioned above.

## 2024-11-02 NOTE — Progress Notes (Signed)
 STROKE TEAM PROGRESS NOTE    SIGNIFICANT HOSPITAL EVENTS 12/3 - hernia repair 12/4 taken back to the OR for hemoperitoneum-active bleed was not identified, abdomen packed with wound VAC in place 12/5 back to the OR for an open abdomen-no evidence of ongoing bleeding and abdomen was closed 12/8 - CT Head and MRI brain with bil strokes   INTERIM HISTORY/SUBJECTIVE Patient is seen in her room with her husband at the bedside.    She is more alert and responsive today and is able to track family around the room. Patient has  cortrak tube.  She is beginning to grunt in response to questions, agitation which is an improvement as she made no sounds previously.  OBJECTIVE  CBC    Component Value Date/Time   WBC 13.4 (H) 11/02/2024 0237   RBC 2.87 (L) 11/02/2024 0237   HGB 8.9 (L) 11/02/2024 0237   HGB 11.9 11/08/2019 1403   HGB 10.6 (L) 11/28/2013 1148   HCT 26.7 (L) 11/02/2024 0237   HCT 35.7 11/08/2019 1403   HCT 31.2 (L) 11/28/2013 1148   PLT 310 11/02/2024 0237   PLT 250 11/08/2019 1403   MCV 93.0 11/02/2024 0237   MCV 99 (H) 11/08/2019 1403   MCV 101.4 (H) 11/28/2013 1148   MCH 31.0 11/02/2024 0237   MCHC 33.3 11/02/2024 0237   RDW 15.6 (H) 11/02/2024 0237   RDW 11.8 11/08/2019 1403   RDW 15.4 (H) 11/28/2013 1148   LYMPHSABS 0.4 (L) 10/29/2024 0530   LYMPHSABS 1.1 11/08/2019 1403   LYMPHSABS 0.8 (L) 11/28/2013 1148   MONOABS 0.8 10/29/2024 0530   MONOABS 0.9 11/28/2013 1148   EOSABS 0.1 10/29/2024 0530   EOSABS 0.0 11/08/2019 1403   BASOSABS 0.0 10/29/2024 0530   BASOSABS 0.0 11/08/2019 1403   BASOSABS 0.0 11/28/2013 1148    BMET    Component Value Date/Time   NA 137 11/02/2024 0237   NA 137 07/01/2024 1505   NA 139 04/22/2014 1028   K 3.3 (L) 11/02/2024 0237   K 4.1 04/22/2014 1028   CL 106 11/02/2024 0237   CO2 23 11/02/2024 0237   CO2 23 04/22/2014 1028   GLUCOSE 119 (H) 11/02/2024 0237   GLUCOSE 81 04/22/2014 1028   BUN 39 (H) 11/02/2024 0237   BUN 20  07/01/2024 1505   BUN 8.8 04/22/2014 1028   CREATININE 1.68 (H) 11/02/2024 0237   CREATININE 0.94 12/10/2021 1532   CREATININE 0.8 04/22/2014 1028   CALCIUM  7.4 (L) 11/02/2024 0237   CALCIUM  9.1 04/22/2014 1028   EGFR 38 (L) 07/01/2024 1505   GFRNONAA 33 (L) 11/02/2024 0237    IMAGING past 24 hours VAS US  LOWER EXTREMITY VENOUS (DVT) Result Date: 11/01/2024  Lower Venous DVT Study Patient Name:  Erin [name]  Date of Exam:   11/01/2024 Medical Rec #: [id number]          Accession #:    [id number] Date of Birth: 06/30/1956          Patient Gender: F Patient Age:   68 years Exam Location:  [facility] Procedure:      VAS US  LOWER EXTREMITY VENOUS (DVT) Referring Phys: TORIBIO SHARPS --------------------------------------------------------------------------------  Indications: Edema.  Risk Factors: Critically ill patient - immobilization Surgery laparoscopic hernia repair 10/23/2024, exploratory laparotomy 10/24/2024 & 10/25/2024. Limitations: Poor ultrasound/tissue interface and shadowing. Comparison Study: Previous exam on 11/28/2013 was negative for DVT Performing Technologist: Ezzie Potters RVT, RDMS  Examination Guidelines: A complete evaluation includes B-mode imaging, spectral  Doppler, color Doppler, and power Doppler as needed of all accessible portions of each vessel. Bilateral testing is considered an integral part of a complete examination. Limited examinations for reoccurring indications may be performed as noted. The reflux portion of the exam is performed with the patient in reverse Trendelenburg.  +---------+---------------+---------+-----------+----------+-------------------+ RIGHT    CompressibilityPhasicitySpontaneityPropertiesThrombus Aging      +---------+---------------+---------+-----------+----------+-------------------+ CFV      Full           Yes      Yes                                       +---------+---------------+---------+-----------+----------+-------------------+ SFJ                                                   unable to visualize +---------+---------------+---------+-----------+----------+-------------------+ FV Prox  Full           Yes      Yes                                      +---------+---------------+---------+-----------+----------+-------------------+ FV Mid   Full           Yes      Yes                                      +---------+---------------+---------+-----------+----------+-------------------+ FV DistalFull           Yes      Yes                                      +---------+---------------+---------+-----------+----------+-------------------+ PFV      Full                                                             +---------+---------------+---------+-----------+----------+-------------------+ POP      Full           Yes      Yes                                      +---------+---------------+---------+-----------+----------+-------------------+ PTV      Full                                                             +---------+---------------+---------+-----------+----------+-------------------+ PERO     Full                                                             +---------+---------------+---------+-----------+----------+-------------------+   +---------+---------------+---------+-----------+----------+-------------------+  LEFT     CompressibilityPhasicitySpontaneityPropertiesThrombus Aging      +---------+---------------+---------+-----------+----------+-------------------+ CFV      Full           Yes      Yes                                      +---------+---------------+---------+-----------+----------+-------------------+ SFJ                                                   unable to visualize +---------+---------------+---------+-----------+----------+-------------------+ FV  Prox  Full           Yes      Yes                                      +---------+---------------+---------+-----------+----------+-------------------+ FV Mid   Full           Yes      Yes                                      +---------+---------------+---------+-----------+----------+-------------------+ FV DistalFull           Yes      Yes                                      +---------+---------------+---------+-----------+----------+-------------------+ PFV      Full                                                             +---------+---------------+---------+-----------+----------+-------------------+ POP      Full           Yes      Yes                                      +---------+---------------+---------+-----------+----------+-------------------+ PTV      Full                                                             +---------+---------------+---------+-----------+----------+-------------------+ PERO     Full                                                             +---------+---------------+---------+-----------+----------+-------------------+     Summary: BILATERAL: - No evidence of deep vein thrombosis seen in the lower extremities, bilaterally. - RIGHT: - A cystic structure is found in  the popliteal fossa (4.47 x 0.72 x 2.74 cm).  LEFT: - No cystic structure found in the popliteal fossa.  *See table(s) above for measurements and observations. Electronically signed by Gaile New MD on 11/01/2024 at 9:01:20 PM.    Final      Vitals:   11/02/24 1000 11/02/24 1018 11/02/24 1100 11/02/24 1155  BP: (!) 184/79 (!) 184/79  (!) 174/101  Pulse: 85 85 94 82  Resp: (!) 27  (!) 22 (!) 26  Temp:      TempSrc:      SpO2: 96%  95% 95%  Weight:      Height:          Physical Exam  Constitutional: NAD  Eyes: No scleral injection.  HENT: ETT in place Head: Normocephalic.  Cardiovascular: Normal rate and regular rhythm.  Respiratory:  tachypneic at times. Skin: WDI.   Neuro (on no sedation): Mental Status: Patient is awake, alert, opens eyes to stimulus. Able to track. People around the room. pt. speech is gibberish and difficult to understand Does  follow only midline commands but grimaces to sternal rub Cranial Nerves: II: PERRL - 3mm brisk, corneal reflexes intact III,IV, VI: Eyes midline, oculocephalic reflex intact, able to track VII: right facial droop X: Cough  intact Motor: Tone is normal. Bulk is normal.  Right arm and leg with spontaneous movement does not localize to pain No movement in bilateral lower extremities  Sensory: Grimaces to sternal rub Cerebellar: Unable to perform    ASSESSMENT/PLAN  [name] is a 68 y.o. female with history of coronary artery disease, hypertension, hyperlipidemia, squamous cell carcinoma of the anus-admitted to the ICU was in the hospital for management of hemorrhagic shock with concern for DIC.  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 These areas are highly suggestive of embolic infarcts MRI  Numerous scattered areas of restricted diffusion throughout the cerebral hemispheres and cerebellum concerning for acute infarcts of embolic etiology. MRA  2 mm inferiorly directed outpouching along the right supraclinoid ICA, most consistent with an infundibulum at the posterior communicating artery origin. A small aneurysm is less likely but not excluded. 2D Echo EF 60-65% 12/8- EEG- ABNORMALITY - Continuous slow, generalized IMPRESSION: This  study is suggestive of generalized cerebral dysfunction ( encephalopathy). No seizures or epileptiform discharges were seen throughout the recording.  LDL 13 HgbA1c 4.9 VTE prophylaxis - Heparin  subcu No antithrombotic prior to admission, now on aspirin  81 mg daily  Therapy recommendations:  LTAC Disposition: Pending  S/p hernia repair 12/3 S/p ex lap 124, 12/5 S/p mesenteric artery stenting 10/14 As needed fentanyl , oxycodone  for pain management CCS following  Hemorrhagic shock ? DIC, ruled out Hx Hypertension Home meds:  Coreg , hydralazine , norvasc -on hold Stable BP goal normotensive (less than systolic 180, MAP goal 65) Episode of hypotension post op- found to have active arterial bleed 4U PRBCs, 1U FFP, 1U Cryo, IVF Off vasopressors Avoid low BP As needed labetalol  and hydralazine  for hypertension  Respiratory Failure Was intubated for procedure PCCM primary Extubated 12/12 So far tolerating well  Dysphagia Patient has post-stroke dysphagia, SLP consulted N.p.o. On  NG tube On tube feeding @50   Other stroke risk factors Advanced age  Other medical issues AKI, Cre 2.88--2.65--2.43--2.26--2.19--2.02, nephro on board Hypokalemia, supplement  Hospital day # 10  I have personally obtained history,examined this patient, reviewed notes, independently viewed imaging studies, participated in medical decision making and plan of care.ROS completed by me personally and pertinent positives fully documented  I have made any additions or clarifications directly to the above note. Agree with note above.  Patient is gradually improving.  She is more awake and interactive today.  Following few simple midline commands.  She has purposeful right-sided movement.  Continue ongoing therapies.  Transfer out of ICU to stepdown bed as per critical care team.  Discussed with Dr. Jude critical care medicine.  Discussed with patient husband at the bedside and answered questions.   I personally  spent a total of 50 minutes in the care of the patient today including getting/reviewing separately obtained history, performing a medically appropriate exam/evaluation, counseling and educating, placing orders, referring and communicating with other health care professionals, documenting clinical information in the EHR, independently interpreting results, and coordinating care.         Eather Popp, MD Medical Director [facility] Pager: [phone] 11/02/2024 12:00 PM     To contact Stroke Continuity provider, please refer to [email or web address]. After hours, contact General Neurology

## 2024-11-02 NOTE — Progress Notes (Signed)
 [facility] ADULT ICU REPLACEMENT PROTOCOL   The patient does apply for the [facility] Adult ICU Electrolyte Replacment Protocol based on the criteria listed below:   1.Exclusion criteria: TCTS, ECMO, Dialysis, and Myasthenia Gravis patients 2. Is GFR >/= 30 ml/min? Yes.    Patient's GFR today is 33 3. Is SCr </= 2? Yes.   Patient's SCr is 1.68 mg/dL 4. Did SCr increase >/= 0.5 in 24 hours? No. 5.Pt's weight >40kg  Yes.   6. Abnormal electrolyte(s): K+ = 3.3  7. Electrolytes replaced per protocol 8.  Call MD STAT for K+ </= 2.5, Phos </= 1, or Mag </= 1 Physician:  Epimenio, eMD   Erin [name] 11/02/2024 6:01 AM

## 2024-11-02 NOTE — Progress Notes (Signed)
 8 Days Post-Op   Subjective/Chief Complaint: More active per RN Speaking some and following/mimicking overnight   Objective: Vital signs in last 24 hours: Temp:  [97.6 F (36.4 C)-98.9 F (37.2 C)] 98.5 F (36.9 C) (12/13 0400) Pulse Rate:  [60-203] 66 (12/13 0600) Resp:  [18-28] 21 (12/13 0600) BP: (125-181)/(49-88) 136/63 (12/13 0600) SpO2:  [90 %-98 %] 95 % (12/13 0600) Weight:  [49.5 kg] 49.5 kg (12/13 0500) Last BM Date : 11/02/24  Intake/Output from previous day: 12/12 0701 - 12/13 0700 In: 2565.4 [NG/GT:2185; IV Piggyback:380.4] Out: 1955 [Urine:1655; Stool:300] Intake/Output this shift: Total I/O In: 1260.3 [NG/GT:1185; IV Piggyback:75.3] Out: 1005 [Urine:780; Stool:225]  PE:  Constitutional: No acute distress,  Eyes: Anicteric sclerae, moist conjunctiva, Lungs: Clear to auscultation bilaterally, normal respiratory effort CV: regular rate and rhythm, no murmurs, no peripheral edema, pedal pulses 2+ GI: Soft, no masses or hepatosplenomegaly, non-tender to palpation, inc cdi Skin: No rashes, palpation reveals normal turgor Neuro: awaken and tracks   Lab Results:  Recent Labs    11/01/24 0403 11/02/24 0237  WBC 14.8* 13.4*  HGB 8.3* 8.9*  HCT 26.2* 26.7*  PLT 252 310   BMET Recent Labs    11/01/24 0403 11/02/24 0237  NA 141 137  K 3.0* 3.3*  CL 107 106  CO2 24 23  GLUCOSE 145* 119*  BUN 46* 39*  CREATININE 2.02* 1.68*  CALCIUM  7.3* 7.4*  Studies/Results: VAS US  LOWER EXTREMITY VENOUS (DVT) Result Date: 11/01/2024  Lower Venous DVT Study Patient Name:  NGOZI ALVIDREZ  Date of Exam:   11/01/2024 Medical Rec #: [id number]          Accession #:    [id number] Date of Birth: 1956-08-09          Patient Gender: F Patient Age:   68 years Exam Location:  [facility] Procedure:      VAS US  LOWER EXTREMITY VENOUS (DVT) Referring Phys: TORIBIO SHARPS --------------------------------------------------------------------------------  Indications: Edema.   Risk Factors: Critically ill patient - immobilization Surgery laparoscopic hernia repair 10/23/2024, exploratory laparotomy 10/24/2024 & 10/25/2024. Limitations: Poor ultrasound/tissue interface and shadowing. Comparison Study: Previous exam on 11/28/2013 was negative for DVT Performing Technologist: Ezzie Potters RVT, RDMS  Examination Guidelines: A complete evaluation includes B-mode imaging, spectral Doppler, color Doppler, and power Doppler as needed of all accessible portions of each vessel. Bilateral testing is considered an integral part of a complete examination. Limited examinations for reoccurring indications may be performed as noted. The reflux portion of the exam is performed with the patient in reverse Trendelenburg.  +---------+---------------+---------+-----------+----------+-------------------+ RIGHT    CompressibilityPhasicitySpontaneityPropertiesThrombus Aging      +---------+---------------+---------+-----------+----------+-------------------+ CFV      Full           Yes      Yes                                      +---------+---------------+---------+-----------+----------+-------------------+ SFJ                                                   unable to visualize +---------+---------------+---------+-----------+----------+-------------------+ FV Prox  Full           Yes      Yes                                      +---------+---------------+---------+-----------+----------+-------------------+  FV Mid   Full           Yes      Yes                                      +---------+---------------+---------+-----------+----------+-------------------+ FV DistalFull           Yes      Yes                                      +---------+---------------+---------+-----------+----------+-------------------+ PFV      Full                                                             +---------+---------------+---------+-----------+----------+-------------------+  POP      Full           Yes      Yes                                      +---------+---------------+---------+-----------+----------+-------------------+ PTV      Full                                                             +---------+---------------+---------+-----------+----------+-------------------+ PERO     Full                                                             +---------+---------------+---------+-----------+----------+-------------------+   +---------+---------------+---------+-----------+----------+-------------------+ LEFT     CompressibilityPhasicitySpontaneityPropertiesThrombus Aging      +---------+---------------+---------+-----------+----------+-------------------+ CFV      Full           Yes      Yes                                      +---------+---------------+---------+-----------+----------+-------------------+ SFJ                                                   unable to visualize +---------+---------------+---------+-----------+----------+-------------------+ FV Prox  Full           Yes      Yes                                      +---------+---------------+---------+-----------+----------+-------------------+ FV Mid   Full           Yes      Yes                                      +---------+---------------+---------+-----------+----------+-------------------+  FV DistalFull           Yes      Yes                                      +---------+---------------+---------+-----------+----------+-------------------+ PFV      Full                                                             +---------+---------------+---------+-----------+----------+-------------------+ POP      Full           Yes      Yes                                      +---------+---------------+---------+-----------+----------+-------------------+ PTV      Full                                                              +---------+---------------+---------+-----------+----------+-------------------+ PERO     Full                                                             +---------+---------------+---------+-----------+----------+-------------------+     Summary: BILATERAL: - No evidence of deep vein thrombosis seen in the lower extremities, bilaterally. - RIGHT: - A cystic structure is found in the popliteal fossa (4.47 x 0.72 x 2.74 cm).  LEFT: - No cystic structure found in the popliteal fossa.  *See table(s) above for measurements and observations. Electronically signed by Gaile New MD on 11/01/2024 at 9:01:20 PM.    Final    DG Chest Port 1 View Result Date: 11/01/2024 CLINICAL DATA:  Shortness of breath EXAM: PORTABLE CHEST 1 VIEW COMPARISON:  Six days ago FINDINGS: The heart size and mediastinal contours are within normal limits. Both lungs are clear. The visualized skeletal structures are unremarkable. Nasogastric tube tip seen in proximal stomach. IMPRESSION: No active disease. Electronically Signed   By: Lynwood Landy Raddle M.D.   On: 11/01/2024 08:40    Anti-infectives: Anti-infectives (From admission, onward)    Start     Dose/Rate Route Frequency Ordered Stop   11/01/24 1200  piperacillin -tazobactam (ZOSYN ) IVPB 3.375 g        3.375 g 12.5 mL/hr over 240 Minutes Intravenous Every 8 hours 11/01/24 0822 11/03/24 2359   10/30/24 1400  piperacillin -tazobactam (ZOSYN ) IVPB 2.25 g  Status:  Discontinued        2.25 g 100 mL/hr over 30 Minutes Intravenous Every 8 hours 10/30/24 0939 11/01/24 0822   10/28/24 1600  ceFEPIme  (MAXIPIME ) 2 g in sodium chloride  0.9 % 100 mL IVPB  Status:  Discontinued        2 g 200 mL/hr over 30 Minutes Intravenous Every 24 hours 10/28/24 1441 10/30/24 0939  10/26/24 1800  cefTRIAXone  (ROCEPHIN ) 2 g in sodium chloride  0.9 % 100 mL IVPB  Status:  Discontinued        2 g 200 mL/hr over 30 Minutes Intravenous Daily-1800 10/26/24 1739 10/28/24 1441   10/23/24 0700   ceFAZolin  (ANCEF ) IVPB 2g/100 mL premix        2 g 200 mL/hr over 30 Minutes Intravenous On call to O.R. 10/23/24 9351 10/23/24 9166       Assessment/Plan: s/p POD 8 from lap VHR and reexploration for bleeding, POD 6 from reexploration and abdominal closure -Cr returning to baseline, appreciate Nephro assistance -Appreciate CCM assistance.  Doing well from pulm standpoint -TF @ goal, SLP working with pt -On ASA for CVA, appreciate Neuro assistance .  Overall appears to be making good stride to recovery   LOS: 10 days    Lynda Leos 11/02/2024

## 2024-11-02 NOTE — Progress Notes (Signed)
 NAME:  FRANKYE SCHWEGEL, MRN:  [id number], DOB:  06/04/1956, LOS: 10 ADMISSION DATE:  10/23/2024, CONSULTATION DATE:  10/23/2024 REFERRING MD: Rubin - CCS, CHIEF COMPLAINT:  Hypotension   History of Present Illness:  68 year old woman who presented to [facility] 12/3 for elective hernia repair. 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  Postoperatively, patient remained hypotensive on pressors.  Found to have hemoperitoneum with hemorrhagic shock.  Course complicated by bilateral strokes  Pertinent Medical History:  Kidney stent for stenosis Abdominal wall hernia present for about 2 years Bilateral femoral bypass surgery in 2014 History of mesenteric stent 09/09/2024   Significant Hospital Events: Including procedures, antibiotic start and stop dates in addition to other pertinent events   12/3 admitted to ICU postop 12/3-post hernia repair 12/4 taken back to the OR for hemoperitoneum-active bleed was not identified, abdomen packed with wound VAC in place 12/5 back to the OR for an open abdomen-no evidence of ongoing bleeding and abdomen was closed 12/8 - CT Head with interval development (since 04/2024) of multiple cortical and subcortical hypodensities in bilateral cerebellar and cerebral hemispheres suggestive of acute/subacute infarcts. MRI Brain with numerous areas of restricted diffusion throughout the cerebral hemispheres/cerebellum c/f acute infarcts of embolic etiology, associated mild edema (R occipital lobe, L temporal lobe).  12/11 tolerating PSV 5/5 >> extubated  SUBJ :  Less awake this morning, per RN was awake until 4 AM and then went to sleep Surgeons note indicate she was more active around 6:30 AM Last dose of pain medications around  midnight Afebrile  Objective:    Blood pressure (!) 143/63, pulse 64, temperature 98.3 F (36.8 C), temperature source Axillary, resp. rate (!) 23, height 5' 3 (1.6 m), weight 49.5 kg, SpO2 93%.        Intake/Output Summary (Last 24 hours) at 11/02/2024 0854 Last data filed at 11/02/2024 0735 Gross per 24 hour  Intake 2878.97 ml  Output 1880 ml  Net 998.97 ml   Filed Weights   10/31/24 0500 11/01/24 0500 11/02/24 0500  Weight: 49.4 kg 50.5 kg 49.5 kg   Physical Examination:  General: Elderly appearing female on cannula, no distress, eyes closed HEENT: [place]/AT, no JVD Neuro: Does not follow commands this morning, flaccid upper extremities CV: RRR, no MRG PULM: Clear breath sounds bilateral, no accessory muscle use GI: Soft, non-distended. Midline wound clean/dry/intact without erythema/drainage.  Extremities: Bilateral symmetric 1+ LE edema noted. Improved edema of bilateral hands. Skin: Warm/dry, no rashes.   Labs show hypokalemia, normal magnesium , decreased BUN/creatinine 39/1.7, decreased leukocytosis  Resolved problems  Hemorrhagic shock, resolved Received blood products (4U PRBCs, 1U FFP, 1U Cryo). Concern for DIC Thrombocytopenia - resolved, platelets 21K on admission  Assessment and Plan:   Not much change in plan today other than monitoring her mental status and respiratory status will likely need 1 more day in the ICU and reassessment of goals of care once stroke service is able to prognosticate   Acute bilateral cerebral and cerebellar infarcts  CT Head with interval development (since 04/2024) of multiple cortical and subcortical hypodensities in bilateral cerebellar and cerebral hemispheres suggestive of acute/subacute infarcts. Concern for embolic strokes 2/2 hypotension in the setting of hemorrhagic shock versus DIC, hypoperfusion vs. hypercoagulability. MRI Brain with numerous areas of restricted diffusion throughout the cerebral hemispheres/cerebellum c/f  acute infarcts of embolic etiology, associated mild edema (R occipital lobe, L temporal lobe)  - Stroke service following , defer to them about repeat imaging - Continue ASA 81mg  daily (ok per CCS) - Frequent neuro checks - Neuroprotective measures: HOB > 30 degrees, normoglycemia, normothermia, - PT/OT >> total assist    Hx HTN  - Continue Amlodipine , Carvedilol , Hydralazine  PRN, Labetalol  PRN,    S/p hernia repair, elective 12/3 S/p ex-lap 12/4, 12/5 - Postoperative management per CCS - Wound care per surgery   Acute kidney injury -improving, suspected ATN due to shock/contrast History of left renal artery stenosis -Good response to Lasix , on hold for now Replete hypokalemia  Acute respiratory insufficiency , extubated 12/11 HCAP -Enterobacter 12/6  -Continue Zosyn  for 7 days total  At risk for malnutrition - Appreciate RD assistance - TF per CCS -- Await SLP -she may need core track  GOC - FULL CODE at present - GOC discussed with family 12/11, they desire reintubation if needed    Harden Staff MD. [facility]. [place] [facility] Pager : [phone]  If no response to pager , please call [phone] until 7 pm After 7:00 pm call Elink  [phone]    11/02/2024, 8:54 AM

## 2024-11-02 NOTE — Plan of Care (Signed)
°  Problem: Clinical Measurements: Goal: Will remain free from infection Outcome: Progressing Goal: Diagnostic test results will improve Outcome: Progressing Goal: Respiratory complications will improve Outcome: Progressing Goal: Cardiovascular complication will be avoided Outcome: Progressing   Problem: Nutrition: Goal: Adequate nutrition will be maintained Outcome: Progressing   Problem: Elimination: Goal: Will not experience complications related to urinary retention Outcome: Progressing   Problem: Education: Goal: Knowledge of disease or condition will improve Outcome: Progressing Goal: Knowledge of patient specific risk factors will improve (DELETE if not current risk factor) Outcome: Progressing   Problem: Ischemic Stroke/TIA Tissue Perfusion: Goal: Complications of ischemic stroke/TIA will be minimized Outcome: Progressing   Problem: Nutrition: Goal: Risk of aspiration will decrease Outcome: Progressing

## 2024-11-03 DIAGNOSIS — J9601 Acute respiratory failure with hypoxia: Secondary | ICD-10-CM | POA: Diagnosis not present

## 2024-11-03 DIAGNOSIS — R571 Hypovolemic shock: Secondary | ICD-10-CM | POA: Diagnosis not present

## 2024-11-03 DIAGNOSIS — I6340 Cerebral infarction due to embolism of unspecified cerebral artery: Secondary | ICD-10-CM | POA: Diagnosis not present

## 2024-11-03 DIAGNOSIS — N170 Acute kidney failure with tubular necrosis: Secondary | ICD-10-CM | POA: Diagnosis not present

## 2024-11-03 DIAGNOSIS — I10 Essential (primary) hypertension: Secondary | ICD-10-CM | POA: Diagnosis not present

## 2024-11-03 DIAGNOSIS — I639 Cerebral infarction, unspecified: Secondary | ICD-10-CM | POA: Diagnosis not present

## 2024-11-03 DIAGNOSIS — I63443 Cerebral infarction due to embolism of bilateral cerebellar arteries: Secondary | ICD-10-CM | POA: Diagnosis not present

## 2024-11-03 DIAGNOSIS — D649 Anemia, unspecified: Secondary | ICD-10-CM | POA: Diagnosis not present

## 2024-11-03 LAB — BASIC METABOLIC PANEL WITH GFR
Anion gap: 10 (ref 5–15)
BUN: 37 mg/dL — ABNORMAL HIGH (ref 8–23)
CO2: 20 mmol/L — ABNORMAL LOW (ref 22–32)
Calcium: 7.8 mg/dL — ABNORMAL LOW (ref 8.9–10.3)
Chloride: 108 mmol/L (ref 98–111)
Creatinine, Ser: 1.57 mg/dL — ABNORMAL HIGH (ref 0.44–1.00)
GFR, Estimated: 36 mL/min — ABNORMAL LOW (ref >60)
Glucose, Bld: 113 mg/dL — ABNORMAL HIGH (ref 70–99)
Potassium: 3.3 mmol/L — ABNORMAL LOW (ref 3.5–5.1)
Sodium: 138 mmol/L (ref 135–145)

## 2024-11-03 LAB — CBC
HCT: 27.1 % — ABNORMAL LOW (ref 36.0–46.0)
Hemoglobin: 8.7 g/dL — ABNORMAL LOW (ref 12.0–15.0)
MCH: 30.1 pg (ref 26.0–34.0)
MCHC: 32.1 g/dL (ref 30.0–36.0)
MCV: 93.8 fL (ref 80.0–100.0)
Platelets: 414 K/uL — ABNORMAL HIGH (ref 150–400)
RBC: 2.89 MIL/uL — ABNORMAL LOW (ref 3.87–5.11)
RDW: 15.5 % (ref 11.5–15.5)
WBC: 9.5 K/uL (ref 4.0–10.5)
nRBC: 0 % (ref 0.0–0.2)

## 2024-11-03 LAB — GLUCOSE, CAPILLARY
Glucose-Capillary: 105 mg/dL — ABNORMAL HIGH (ref 70–99)
Glucose-Capillary: 98 mg/dL (ref 70–99)
Glucose-Capillary: 103 mg/dL — ABNORMAL HIGH (ref 70–99)
Glucose-Capillary: 118 mg/dL — ABNORMAL HIGH (ref 70–99)
Glucose-Capillary: 115 mg/dL — ABNORMAL HIGH (ref 70–99)
Glucose-Capillary: 113 mg/dL — ABNORMAL HIGH (ref 70–99)

## 2024-11-03 LAB — PHOSPHORUS: Phosphorus: 3.4 mg/dL (ref 2.5–4.6)

## 2024-11-03 LAB — MAGNESIUM: Magnesium: 1.8 mg/dL (ref 1.7–2.4)

## 2024-11-03 MED ORDER — MAGNESIUM SULFATE 2 GM/50ML IV SOLN
2.0000 g | Freq: Once | INTRAVENOUS | Status: AC
  Administered 2024-11-03: 2 g via INTRAVENOUS
  Filled 2024-11-03: qty 50

## 2024-11-03 MED ORDER — POTASSIUM CHLORIDE 20 MEQ PO PACK
40.0000 meq | PACK | ORAL | Status: AC
  Administered 2024-11-03 (×2): 40 meq
  Filled 2024-11-03 (×2): qty 2

## 2024-11-03 MED ORDER — NUTRISOURCE FIBER PO PACK
1.0000 | PACK | Freq: Two times a day (BID) | ORAL | Status: DC
  Administered 2024-11-03 – 2024-11-10 (×15): 1
  Filled 2024-11-03 (×15): qty 1

## 2024-11-03 NOTE — Progress Notes (Signed)
 9 Days Post-Op   Subjective/Chief Complaint: Pt with no acute issues overnight Con't to interact and talk with nursing and family   Objective: Vital signs in last 24 hours: Temp:  [98.3 F (36.8 C)-99.2 F (37.3 C)] 99.2 F (37.3 C) (12/14 0400) Pulse Rate:  [61-108] 80 (12/14 0420) Resp:  [17-31] 20 (12/14 0420) BP: (143-195)/(62-116) 176/90 (12/14 0420) SpO2:  [93 %-96 %] 93 % (12/14 0420) Weight:  [50.9 kg] 50.9 kg (12/14 0434) Last BM Date : 11/02/24  Intake/Output from previous day: 12/13 0701 - 12/14 0700 In: 1666.2 [NG/GT:1520; IV Piggyback:146.2] Out: 2750 [Urine:2050; Stool:700] Intake/Output this shift: Total I/O In: 500 [NG/GT:450; IV Piggyback:50] Out: 1250 [Urine:750; Stool:500]  PE:   Constitutional: No acute distress,  Eyes: Anicteric sclerae, moist conjunctiva, Lungs: Clear to auscultation bilaterally, normal respiratory effort CV: regular rate and rhythm, no murmurs, no peripheral edema, pedal pulses 2+ GI: Soft, no masses or hepatosplenomegaly, non-tender to palpation, inc cdi Skin: No rashes, palpation reveals normal turgor Neuro: interact and talks with appropriate words  Lab Results:  Recent Labs    11/02/24 0237 11/03/24 0438  WBC 13.4* 9.5  HGB 8.9* 8.7*  HCT 26.7* 27.1*  PLT 310 414*   BMET Recent Labs    11/02/24 0237 11/03/24 0438  NA 137 138  K 3.3* 3.3*  CL 106 108  CO2 23 20*  GLUCOSE 119* 113*  BUN 39* 37*  CREATININE 1.68* 1.57*  CALCIUM  7.4* 7.8*   PT/INR No results for input(s): LABPROT, INR in the last 72 hours. ABG No results for input(s): PHART, HCO3 in the last 72 hours.  Invalid input(s): PCO2, PO2  Studies/Results: VAS US  LOWER EXTREMITY VENOUS (DVT) Result Date: 11/01/2024  Lower Venous DVT Study Patient Name:  Erin [name]  Date of Exam:   11/01/2024 Medical Rec #: [id number]          Accession #:    [id number] Date of Birth: 1956-10-05          Patient Gender: F Patient Age:   68 years  Exam Location:  [facility] Procedure:      VAS US  LOWER EXTREMITY VENOUS (DVT) Referring Phys: TORIBIO SHARPS --------------------------------------------------------------------------------  Indications: Edema.  Risk Factors: Critically ill patient - immobilization Surgery laparoscopic hernia repair 10/23/2024, exploratory laparotomy 10/24/2024 & 10/25/2024. Limitations: Poor ultrasound/tissue interface and shadowing. Comparison Study: Previous exam on 11/28/2013 was negative for DVT Performing Technologist: Ezzie Potters RVT, RDMS  Examination Guidelines: A complete evaluation includes B-mode imaging, spectral Doppler, color Doppler, and power Doppler as needed of all accessible portions of each vessel. Bilateral testing is considered an integral part of a complete examination. Limited examinations for reoccurring indications may be performed as noted. The reflux portion of the exam is performed with the patient in reverse Trendelenburg.  +---------+---------------+---------+-----------+----------+-------------------+ RIGHT    CompressibilityPhasicitySpontaneityPropertiesThrombus Aging      +---------+---------------+---------+-----------+----------+-------------------+ CFV      Full           Yes      Yes                                      +---------+---------------+---------+-----------+----------+-------------------+ SFJ  unable to visualize +---------+---------------+---------+-----------+----------+-------------------+ FV Prox  Full           Yes      Yes                                      +---------+---------------+---------+-----------+----------+-------------------+ FV Mid   Full           Yes      Yes                                      +---------+---------------+---------+-----------+----------+-------------------+ FV DistalFull           Yes      Yes                                       +---------+---------------+---------+-----------+----------+-------------------+ PFV      Full                                                             +---------+---------------+---------+-----------+----------+-------------------+ POP      Full           Yes      Yes                                      +---------+---------------+---------+-----------+----------+-------------------+ PTV      Full                                                             +---------+---------------+---------+-----------+----------+-------------------+ PERO     Full                                                             +---------+---------------+---------+-----------+----------+-------------------+   +---------+---------------+---------+-----------+----------+-------------------+ LEFT     CompressibilityPhasicitySpontaneityPropertiesThrombus Aging      +---------+---------------+---------+-----------+----------+-------------------+ CFV      Full           Yes      Yes                                      +---------+---------------+---------+-----------+----------+-------------------+ SFJ                                                   unable to visualize +---------+---------------+---------+-----------+----------+-------------------+ FV Prox  Full           Yes      Yes                                      +---------+---------------+---------+-----------+----------+-------------------+  FV Mid   Full           Yes      Yes                                      +---------+---------------+---------+-----------+----------+-------------------+ FV DistalFull           Yes      Yes                                      +---------+---------------+---------+-----------+----------+-------------------+ PFV      Full                                                             +---------+---------------+---------+-----------+----------+-------------------+  POP      Full           Yes      Yes                                      +---------+---------------+---------+-----------+----------+-------------------+ PTV      Full                                                             +---------+---------------+---------+-----------+----------+-------------------+ PERO     Full                                                             +---------+---------------+---------+-----------+----------+-------------------+     Summary: BILATERAL: - No evidence of deep vein thrombosis seen in the lower extremities, bilaterally. - RIGHT: - A cystic structure is found in the popliteal fossa (4.47 x 0.72 x 2.74 cm).  LEFT: - No cystic structure found in the popliteal fossa.  *See table(s) above for measurements and observations. Electronically signed by Gaile New MD on 11/01/2024 at 9:01:20 PM.    Final    DG Chest Port 1 View Result Date: 11/01/2024 CLINICAL DATA:  Shortness of breath EXAM: PORTABLE CHEST 1 VIEW COMPARISON:  Six days ago FINDINGS: The heart size and mediastinal contours are within normal limits. Both lungs are clear. The visualized skeletal structures are unremarkable. Nasogastric tube tip seen in proximal stomach. IMPRESSION: No active disease. Electronically Signed   By: Lynwood Landy Raddle M.D.   On: 11/01/2024 08:40    Anti-infectives: Anti-infectives (From admission, onward)    Start     Dose/Rate Route Frequency Ordered Stop   11/01/24 1200  piperacillin -tazobactam (ZOSYN ) IVPB 3.375 g        3.375 g 12.5 mL/hr over 240 Minutes Intravenous Every 8 hours 11/01/24 0822 11/03/24 2359   10/30/24 1400  piperacillin -tazobactam (ZOSYN ) IVPB 2.25 g  Status:  Discontinued  2.25 g 100 mL/hr over 30 Minutes Intravenous Every 8 hours 10/30/24 0939 11/01/24 0822   10/28/24 1600  ceFEPIme  (MAXIPIME ) 2 g in sodium chloride  0.9 % 100 mL IVPB  Status:  Discontinued        2 g 200 mL/hr over 30 Minutes Intravenous Every 24 hours  10/28/24 1441 10/30/24 0939   10/26/24 1800  cefTRIAXone  (ROCEPHIN ) 2 g in sodium chloride  0.9 % 100 mL IVPB  Status:  Discontinued        2 g 200 mL/hr over 30 Minutes Intravenous Daily-1800 10/26/24 1739 10/28/24 1441   10/23/24 0700  ceFAZolin  (ANCEF ) IVPB 2g/100 mL premix        2 g 200 mL/hr over 30 Minutes Intravenous On call to O.R. 10/23/24 9351 10/23/24 9166       Assessment/Plan: s/p POD 9 from lap VHR and reexploration for bleeding, POD 7 from reexploration and abdominal closure -Cr returning to baseline -Appreciate CCM assistance.  Doing well from pulm standpoint -TF @ goal, SLP working with pt -On ASA for CVA, Pt is becoming more interactive, appreciate Neuro assistance .   Overall appears to be making good stride to recovery  LOS: 11 days    Lynda Leos 11/03/2024

## 2024-11-03 NOTE — Plan of Care (Signed)
°  Problem: Clinical Measurements: Goal: Respiratory complications will improve Outcome: Progressing Goal: Cardiovascular complication will be avoided Outcome: Progressing   Problem: Nutrition: Goal: Adequate nutrition will be maintained Outcome: Progressing   Problem: Elimination: Goal: Will not experience complications related to bowel motility Outcome: Progressing Goal: Will not experience complications related to urinary retention Outcome: Progressing   Problem: Skin Integrity: Goal: Risk for impaired skin integrity will decrease Outcome: Progressing   Problem: Ischemic Stroke/TIA Tissue Perfusion: Goal: Complications of ischemic stroke/TIA will be minimized Outcome: Progressing   Problem: Nutrition: Goal: Risk of aspiration will decrease Outcome: Progressing

## 2024-11-03 NOTE — Plan of Care (Signed)
°  Problem: Education: Goal: Knowledge of General Education information will improve Description: Including pain rating scale, medication(s)/side effects and non-pharmacologic comfort measures Outcome: Progressing   Problem: Health Behavior/Discharge Planning: Goal: Ability to manage health-related needs will improve Outcome: Progressing   Problem: Clinical Measurements: Goal: Ability to maintain clinical measurements within normal limits will improve Outcome: Progressing Goal: Will remain free from infection Outcome: Progressing Goal: Diagnostic test results will improve Outcome: Progressing Goal: Respiratory complications will improve Outcome: Progressing Goal: Cardiovascular complication will be avoided Outcome: Progressing   Problem: Activity: Goal: Risk for activity intolerance will decrease Outcome: Progressing   Problem: Nutrition: Goal: Adequate nutrition will be maintained Outcome: Progressing   Problem: Coping: Goal: Level of anxiety will decrease Outcome: Progressing   Problem: Elimination: Goal: Will not experience complications related to bowel motility Outcome: Progressing Goal: Will not experience complications related to urinary retention Outcome: Progressing   Problem: Pain Managment: Goal: General experience of comfort will improve and/or be controlled Outcome: Progressing   Problem: Safety: Goal: Ability to remain free from injury will improve Outcome: Progressing   Problem: Skin Integrity: Goal: Risk for impaired skin integrity will decrease Outcome: Progressing   Problem: Activity: Goal: Ability to tolerate increased activity will improve Outcome: Progressing   Problem: Respiratory: Goal: Ability to maintain a clear airway and adequate ventilation will improve Outcome: Progressing   Problem: Role Relationship: Goal: Method of communication will improve Outcome: Progressing   Problem: Education: Goal: Knowledge of disease or condition  will improve Outcome: Progressing Goal: Knowledge of secondary prevention will improve (MUST DOCUMENT ALL) Outcome: Progressing Goal: Knowledge of patient specific risk factors will improve (DELETE if not current risk factor) Outcome: Progressing   Problem: Ischemic Stroke/TIA Tissue Perfusion: Goal: Complications of ischemic stroke/TIA will be minimized Outcome: Progressing   Problem: Coping: Goal: Will verbalize positive feelings about self Outcome: Progressing Goal: Will identify appropriate support needs Outcome: Progressing   Problem: Health Behavior/Discharge Planning: Goal: Ability to manage health-related needs will improve Outcome: Progressing Goal: Goals will be collaboratively established with patient/family Outcome: Progressing   Problem: Self-Care: Goal: Ability to participate in self-care as condition permits will improve Outcome: Progressing Goal: Verbalization of feelings and concerns over difficulty with self-care will improve Outcome: Progressing Goal: Ability to communicate needs accurately will improve Outcome: Progressing   Problem: Nutrition: Goal: Risk of aspiration will decrease Outcome: Progressing Goal: Dietary intake will improve Outcome: Progressing

## 2024-11-03 NOTE — Progress Notes (Signed)
 NAME:  Erin [name], MRN:  [id number], DOB:  [birth year], LOS: 11 ADMISSION DATE:  10/23/2024, CONSULTATION DATE:  10/23/2024 REFERRING MD: Rubin - CCS, CHIEF COMPLAINT:  Hypotension   History of Present Illness:  68 year old woman who presented to [facility] 12/3 for elective hernia repair. 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  Postoperatively, patient remained hypotensive on pressors.  Found to have hemoperitoneum with hemorrhagic shock.  Course complicated by bilateral strokes  Pertinent Medical History:  Kidney stent for stenosis Abdominal wall hernia present for about 2 years Bilateral femoral bypass surgery in 2014 History of mesenteric stent 09/09/2024   Significant Hospital Events: Including procedures, antibiotic start and stop dates in addition to other pertinent events   12/3 admitted to ICU postop 12/3-post hernia repair 12/4 taken back to the OR for hemoperitoneum-active bleed was not identified, abdomen packed with wound VAC in place 12/5 back to the OR for an open abdomen-no evidence of ongoing bleeding and abdomen was closed 12/8 - CT Head with interval development (since 04/2024) of multiple cortical and subcortical hypodensities in bilateral cerebellar and cerebral hemispheres suggestive of acute/subacute infarcts. MRI Brain with numerous areas of restricted diffusion throughout the cerebral hemispheres/cerebellum c/f acute infarcts of embolic etiology, associated mild edema (R occipital lobe, L temporal lobe).  12/11 tolerating PSV 5/5 >> extubated  SUBJ :  Again less responsive for me this morning but husband states was more active yesterday evening Was more awake on stroke team evaluation yesterday Mumbling words per  RN Afebrile  Objective:    Blood pressure (!) 167/69, pulse 77, temperature 98.3 F (36.8 C), temperature source Axillary, resp. rate (!) 28, height 5' 3 (1.6 m), weight 50.9 kg, SpO2 94%.        Intake/Output Summary (Last 24 hours) at 11/03/2024 0958 Last data filed at 11/03/2024 0800 Gross per 24 hour  Intake 2093.17 ml  Output 2750 ml  Net -656.83 ml   Filed Weights   11/01/24 0500 11/02/24 0500 11/03/24 0434  Weight: 50.5 kg 49.5 kg 50.9 kg   Physical Examination:  General: Elderly woman on cannula, no distress, eyes closed HEENT: [place]/AT, no JVD Neuro: Flaccid upper extremities, does not follow commands, some purposeful movement of right arm noted by stroke team yesterday CV: S1-S2 regular PULM: No accessory muscle use, decreased breath sounds bilateral GI: Soft, non-distended. Midline wound clean/dry/intact without erythema/drainage.  Extremities: Bilateral symmetric 1+ LE edema noted. Improved edema of bilateral hands. Skin: Warm/dry, no rashes.   Labs show no leukocytosis, stable anemia, hypokalemia, improved renal function 37/1.5  Resolved problems  Hemorrhagic shock, resolved Received blood products (4U PRBCs, 1U FFP, 1U Cryo). Concern for DIC Thrombocytopenia - resolved, platelets 21K on admission  Assessment and Plan:      Acute bilateral cerebral and cerebellar infarcts  CT Head with interval development (since 04/2024) of multiple cortical and subcortical hypodensities in bilateral cerebellar and cerebral hemispheres suggestive of acute/subacute infarcts. Concern for embolic strokes 2/2 hypotension in the setting of hemorrhagic shock versus DIC, hypoperfusion vs. hypercoagulability. MRI Brain with numerous areas of restricted diffusion throughout the cerebral hemispheres/cerebellum c/f acute infarcts of embolic etiology, associated mild edema (R occipital lobe, L temporal lobe)  - Stroke service following - Continue ASA 81mg  daily (ok per CCS) - Frequent  neuro checks - PT/OT >>  total assist , remains to be seen what progress she will make, she will have to improve much more before she can be a candidate for CIR   Hx HTN  - Continue Amlodipine , Carvedilol , Hydralazine  PRN, Labetalol  PRN,    S/p hernia repair, elective 12/3 S/p ex-lap 12/4, 12/5 - Postoperative management per CCS - Wound care per surgery Dysphagia -continue tube feeds via core track, speech following to advance as she improves   Acute kidney injury -improving, suspected ATN due to shock/contrast History of left renal artery stenosis -Good response to Lasix , on hold for now Replete hypokalemia  Acute respiratory insufficiency , extubated 12/11 HCAP -Enterobacter 12/6  7 days total of Zosyn  planned   GOC - FULL CODE at present - GOC discussed with family 12/11, they desire reintubation if needed.  Ongoing discussion based on how she progresses over next few days  Okay to transfer to stepdown unit on neurofloor, TRH to take over 12/15, discussed with stroke service   Harden Staff MD. [facility] [place] [facility] Pager : [phone]  If no response to pager , please call [phone] until 7 pm After 7:00 pm call Elink  [phone]    11/03/2024, 9:58 AM

## 2024-11-03 NOTE — Progress Notes (Addendum)
 STROKE TEAM PROGRESS NOTE    SIGNIFICANT HOSPITAL EVENTS 12/3 - hernia repair 12/4 taken back to the OR for hemoperitoneum-active bleed was not identified, abdomen packed with wound VAC in place 12/5 back to the OR for an open abdomen-no evidence of ongoing bleeding and abdomen was closed 12/8 - CT Head and MRI brain with bil strokes   INTERIM HISTORY/SUBJECTIVE Patient is seen in her room with family.    She is more alert and responsive today and is able to track family around the room. Patient has  cortrak tube.  Starting to try to repeat but does not answer questions. Likely to transfer out of ICU today.   OBJECTIVE  CBC    Component Value Date/Time   WBC 9.5 11/03/2024 0438   RBC 2.89 (L) 11/03/2024 0438   HGB 8.7 (L) 11/03/2024 0438   HGB 11.9 11/08/2019 1403   HGB 10.6 (L) 11/28/2013 1148   HCT 27.1 (L) 11/03/2024 0438   HCT 35.7 11/08/2019 1403   HCT 31.2 (L) 11/28/2013 1148   PLT 414 (H) 11/03/2024 0438   PLT 250 11/08/2019 1403   MCV 93.8 11/03/2024 0438   MCV 99 (H) 11/08/2019 1403   MCV 101.4 (H) 11/28/2013 1148   MCH 30.1 11/03/2024 0438   MCHC 32.1 11/03/2024 0438   RDW 15.5 11/03/2024 0438   RDW 11.8 11/08/2019 1403   RDW 15.4 (H) 11/28/2013 1148   LYMPHSABS 0.4 (L) 10/29/2024 0530   LYMPHSABS 1.1 11/08/2019 1403   LYMPHSABS 0.8 (L) 11/28/2013 1148   MONOABS 0.8 10/29/2024 0530   MONOABS 0.9 11/28/2013 1148   EOSABS 0.1 10/29/2024 0530   EOSABS 0.0 11/08/2019 1403   BASOSABS 0.0 10/29/2024 0530   BASOSABS 0.0 11/08/2019 1403   BASOSABS 0.0 11/28/2013 1148    BMET    Component Value Date/Time   NA 138 11/03/2024 0438   NA 137 07/01/2024 1505   NA 139 04/22/2014 1028   K 3.3 (L) 11/03/2024 0438   K 4.1 04/22/2014 1028   CL 108 11/03/2024 0438   CO2 20 (L) 11/03/2024 0438   CO2 23 04/22/2014 1028   GLUCOSE 113 (H) 11/03/2024 0438   GLUCOSE 81 04/22/2014 1028   BUN 37 (H) 11/03/2024 0438   BUN 20 07/01/2024 1505   BUN 8.8 04/22/2014 1028    CREATININE 1.57 (H) 11/03/2024 0438   CREATININE 0.94 12/10/2021 1532   CREATININE 0.8 04/22/2014 1028   CALCIUM  7.8 (L) 11/03/2024 0438   CALCIUM  9.1 04/22/2014 1028   EGFR 38 (L) 07/01/2024 1505   GFRNONAA 36 (L) 11/03/2024 0438    IMAGING past 24 hours No results found.    Vitals:   11/03/24 0420 11/03/24 0434 11/03/24 0700 11/03/24 0800  BP: (!) 176/90  (!) 167/112 (!) 155/60  Pulse: 80  85 78  Resp: 20  (!) 25 (!) 22  Temp:    98.3 F (36.8 C)  TempSrc:    Axillary  SpO2: 93%  95% 96%  Weight:  50.9 kg    Height:          Physical Exam  Constitutional: NAD  Eyes: No scleral injection.  HENT: ETT in place Head: Normocephalic.  Cardiovascular: Normal rate and regular rhythm.  Respiratory: tachypneic at times. Skin: WDI.   Neuro : Mental Status: Patient is awake, alert, opens eyes to stimulus. Able to track. People around the room. pt. speech is hypophonic. States my name is but does not answer.  Cranial Nerves: II: PERRL - 3mm  brisk, corneal reflexes intact III,IV, VI: Eyes midline, oculocephalic reflex intact, able to track VII: right facial droop X: Cough  intact XII: attempts to protrude tongue Motor: Tone is normal. Bulk is normal.  Purposeful in all extremities  Generalized weakness Sensory: Moves to touch in all extremities  Cerebellar: Unable to perform  ASSESSMENT/PLAN  Ms. SEHER SCHLAGEL is a 68 y.o. female with history of coronary artery disease, hypertension, hyperlipidemia, squamous cell carcinoma of the anus-admitted to the ICU was in the hospital for management of hemorrhagic shock with concern for DIC.  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  NIH on Admission 30  Stroke:  Bilateral areas of acute infarct in the cerebrum and cerebellum  Etiology likely due to profound intracranial hypoperfusion due to severe anemia and hypovolemic  shock CT head - Interval development of multiple cortical and subcortical edema areas involving bilateral cerebral and cerebellar hemispheres. These areas are highly suggestive of embolic infarcts MRI  Numerous scattered areas of restricted diffusion throughout the cerebral hemispheres and cerebellum concerning for acute infarcts of embolic etiology. MRA  2 mm inferiorly directed outpouching along the right supraclinoid ICA, most consistent with an infundibulum at the posterior communicating artery origin. A small aneurysm is less likely but not excluded. 2D Echo EF 60-65% 12/8- EEG- ABNORMALITY - Continuous slow, generalized IMPRESSION: This study is suggestive of generalized cerebral dysfunction ( encephalopathy). No seizures or epileptiform discharges were seen throughout the recording.  LDL 13 HgbA1c 4.9 VTE prophylaxis - Heparin  subcu No antithrombotic prior to admission, now on aspirin  81 mg daily  Therapy recommendations:  LTAC Disposition: Pending  S/p hernia repair 12/3 S/p ex lap 124, 12/5 S/p mesenteric artery stenting 10/14 As needed fentanyl , oxycodone  for pain management CCS following  Hemorrhagic shock ? DIC, ruled out Hx Hypertension Home meds:  Coreg , hydralazine , norvasc -on hold Stable BP goal normotensive (less than systolic 180, MAP goal 65) Episode of hypotension post op- found to have active arterial bleed 4U PRBCs, 1U FFP, 1U Cryo, IVF Off vasopressors Avoid low BP As needed labetalol  and hydralazine  for hypertension  Respiratory Failure Was intubated for procedure PCCM primary Extubated 12/12 So far tolerating well  Dysphagia Patient has post-stroke dysphagia, SLP consulted N.p.o. On NG tube On tube feeding @50   Other stroke risk factors Advanced age  Other medical issues AKI, Cre 2.88--2.65--2.43--2.26--2.19--2.02, nephro on board Hypokalemia, supplement  Hospital day # 11  Patient seen and examined by NP/APP with MD. MD to update note as  needed.   Jorene Last, DNP, FNP-BC [facility] Pager: [phone]  I have personally obtained history,examined this patient, reviewed notes, independently viewed imaging studies, participated in medical decision making and plan of care.ROS completed by me personally and pertinent positives fully documented  I have made any additions or clarifications directly to the above note. Agree with note above.  Patient continues to show improvement in her mental status but speech remains quite minimal and gibberish.  Continue ongoing therapies.  Mobilize out of bed.  Transfer out of ICU to floor bed today.  Discussed with patient's cousin at the bedside and Dr. Jude critical care medicine   I personally spent a total of 50 minutes in the care of the patient today including getting/reviewing separately obtained history, performing a medically appropriate exam/evaluation, counseling and educating, placing orders, referring and communicating with other health care professionals, documenting clinical information in the EHR, independently interpreting results,  and coordinating care.        Eather Popp, MD Medical Director [facility] Pager: [phone] 11/03/2024 1:19 PM     To contact Stroke Continuity provider, please refer to [email or web address]. After hours, contact General Neurology

## 2024-11-03 NOTE — Progress Notes (Signed)
 [facility] ADULT ICU REPLACEMENT PROTOCOL   The patient does apply for the [facility] Adult ICU Electrolyte Replacment Protocol based on the criteria listed below:   1.Exclusion criteria: TCTS, ECMO, Dialysis, and Myasthenia Gravis patients 2. Is GFR >/= 30 ml/min? Yes.    Patient's GFR today is 36 3. Is SCr </= 2? Yes.   Patient's SCr is 1.57 mg/dL 4. Did SCr increase >/= 0.5 in 24 hours? No. 5.Pt's weight >40kg  Yes.   6. Abnormal electrolyte(s): K+ = 3.3, Mg = 1.8  7. Electrolytes replaced per protocol 8.  Call MD STAT for K+ </= 2.5, Phos </= 1, or Mag </= 1 Physician:  [place], eMD   Erin [name] 11/03/2024 6:17 AM

## 2024-11-04 DIAGNOSIS — D649 Anemia, unspecified: Secondary | ICD-10-CM | POA: Diagnosis not present

## 2024-11-04 DIAGNOSIS — I63443 Cerebral infarction due to embolism of bilateral cerebellar arteries: Secondary | ICD-10-CM | POA: Diagnosis not present

## 2024-11-04 DIAGNOSIS — I6340 Cerebral infarction due to embolism of unspecified cerebral artery: Secondary | ICD-10-CM | POA: Diagnosis not present

## 2024-11-04 DIAGNOSIS — R571 Hypovolemic shock: Secondary | ICD-10-CM | POA: Diagnosis not present

## 2024-11-04 LAB — CBC WITH DIFFERENTIAL/PLATELET
Abs Immature Granulocytes: 0.37 K/uL — ABNORMAL HIGH (ref 0.00–0.07)
Basophils Absolute: 0 K/uL (ref 0.0–0.1)
Basophils Relative: 0 %
Eosinophils Absolute: 0.1 K/uL (ref 0.0–0.5)
Eosinophils Relative: 1 %
HCT: 28.1 % — ABNORMAL LOW (ref 36.0–46.0)
Hemoglobin: 8.9 g/dL — ABNORMAL LOW (ref 12.0–15.0)
Immature Granulocytes: 4 %
Lymphocytes Relative: 9 %
Lymphs Abs: 0.9 K/uL (ref 0.7–4.0)
MCH: 30.7 pg (ref 26.0–34.0)
MCHC: 31.7 g/dL (ref 30.0–36.0)
MCV: 96.9 fL (ref 80.0–100.0)
Monocytes Absolute: 1.2 K/uL — ABNORMAL HIGH (ref 0.1–1.0)
Monocytes Relative: 11 %
Neutro Abs: 7.8 K/uL — ABNORMAL HIGH (ref 1.7–7.7)
Neutrophils Relative %: 75 %
Platelets: 476 K/uL — ABNORMAL HIGH (ref 150–400)
RBC: 2.9 MIL/uL — ABNORMAL LOW (ref 3.87–5.11)
RDW: 15.7 % — ABNORMAL HIGH (ref 11.5–15.5)
WBC: 10.4 K/uL (ref 4.0–10.5)
nRBC: 0 % (ref 0.0–0.2)

## 2024-11-04 LAB — BASIC METABOLIC PANEL WITH GFR
Anion gap: 9 (ref 5–15)
BUN: 34 mg/dL — ABNORMAL HIGH (ref 8–23)
CO2: 19 mmol/L — ABNORMAL LOW (ref 22–32)
Calcium: 8.1 mg/dL — ABNORMAL LOW (ref 8.9–10.3)
Chloride: 113 mmol/L — ABNORMAL HIGH (ref 98–111)
Creatinine, Ser: 1.5 mg/dL — ABNORMAL HIGH (ref 0.44–1.00)
GFR, Estimated: 38 mL/min — ABNORMAL LOW (ref >60)
Glucose, Bld: 99 mg/dL (ref 70–99)
Potassium: 4.3 mmol/L (ref 3.5–5.1)
Sodium: 141 mmol/L (ref 135–145)

## 2024-11-04 LAB — MAGNESIUM: Magnesium: 1.9 mg/dL (ref 1.7–2.4)

## 2024-11-04 LAB — GLUCOSE, CAPILLARY
Glucose-Capillary: 100 mg/dL — ABNORMAL HIGH (ref 70–99)
Glucose-Capillary: 103 mg/dL — ABNORMAL HIGH (ref 70–99)
Glucose-Capillary: 108 mg/dL — ABNORMAL HIGH (ref 70–99)
Glucose-Capillary: 109 mg/dL — ABNORMAL HIGH (ref 70–99)
Glucose-Capillary: 109 mg/dL — ABNORMAL HIGH (ref 70–99)
Glucose-Capillary: 113 mg/dL — ABNORMAL HIGH (ref 70–99)

## 2024-11-04 LAB — PHOSPHORUS: Phosphorus: 3.5 mg/dL (ref 2.5–4.6)

## 2024-11-04 NOTE — Plan of Care (Signed)
°  Problem: Nutrition: Goal: Adequate nutrition will be maintained Outcome: Progressing   Problem: Skin Integrity: Goal: Risk for impaired skin integrity will decrease Outcome: Progressing   Problem: Nutrition: Goal: Dietary intake will improve Outcome: Progressing   Problem: Education: Goal: Knowledge of General Education information will improve Description: Including pain rating scale, medication(s)/side effects and non-pharmacologic comfort measures Outcome: Not Progressing   Problem: Health Behavior/Discharge Planning: Goal: Ability to manage health-related needs will improve Outcome: Not Progressing   Problem: Role Relationship: Goal: Method of communication will improve Outcome: Not Progressing   Problem: Coping: Goal: Will verbalize positive feelings about self Outcome: Not Progressing Goal: Will identify appropriate support needs Outcome: Not Progressing   Problem: Health Behavior/Discharge Planning: Goal: Ability to manage health-related needs will improve Outcome: Not Progressing   Problem: Self-Care: Goal: Ability to participate in self-care as condition permits will improve Outcome: Not Progressing Goal: Ability to communicate needs accurately will improve Outcome: Not Progressing

## 2024-11-04 NOTE — Plan of Care (Signed)
 Pt alert.  MAE x 4.  Globally aphasic and unable to follow commands.  VSS.  Tolerating tf.  Bowels moving.  I and O adequate.

## 2024-11-04 NOTE — Progress Notes (Signed)
 PROGRESS NOTE    Erin [name]  FMW:[id number] DOB: 11-02-1956 DOA: 10/23/2024 PCP: [name] [name], MD   Brief Narrative:  68 year old woman who presented to [facility] 12/3 for elective hernia repair. 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  Patient admitted initially for postop hernia repair found to have hemoperitoneum with hemorrhagic shock postoperatively requiring intubation and pressors.  Her course was subsequently complicated by bilateral strokes thought to be provoked by hypotension.  At this time patient is stabilizing, she has been extubated, continues tube feeds via NG and is otherwise stable from a surgical standpoint.  Transition out of the ICU to hospitalist team on 12/15 with plan for ongoing care with ultimate plan for disposition pending patient's clinical course.  Hospital course 12/3 admitted to ICU postop 12/3-post hernia repair 12/4 taken back to the OR for hemoperitoneum-active bleed was not identified, abdomen packed with wound VAC in place 12/5 back to the OR for an open abdomen-no evidence of ongoing bleeding and abdomen was closed 12/8 - CT Head with interval development (since 04/2024) of multiple cortical and subcortical hypodensities in bilateral cerebellar and cerebral hemispheres suggestive of acute/subacute infarcts. MRI Brain with numerous areas of restricted diffusion throughout the cerebral hemispheres/cerebellum c/f acute infarcts of embolic etiology, associated mild edema (R occipital lobe, L temporal lobe).  12/11 tolerating PSV 5/5 >> extubated 12/15 transition to TRH team - goals of care discussion with husband(see below)   Assessment & Plan:   Principal Problem:   S/P hernia repair Active Problems:   Shock  (HCC)   Malnutrition of moderate degree  Goals of care Lengthy discussion with husband at bedside, he understands that patient has a long road ahead of her, that her NG tube will likely need to be transition to a formal PEG tube unless she has a marked improvement in n.p.o. in the next week which is unlikely.  We also discussed potentially if patient were to continue to be in her current state which is essentially nonverbal and poorly interactive will transitioning to a palliative care plan be of any interest.  Patient and husband previously had worked in patient care and he is comfortable taking the patient home even if she required increased levels of care given his previous knowledge.  At this time continue full scope of care patient remains full code, plan for PEG tube placement in the next few days if p.o. intake and mental status do not improve.  SHOCK - likely hemorrhagic - Complicated by postoperative complications and hemoperitoneum - Remains off pressors  Acute bilateral cerebral and cerebellar infarcts CT head with multiple cortical and subcortical hypodensities Neuro consulted MRI brain confirms acute infarct - likely embolic - Continue ASA 81 - PT/OT/SLP >> total assist , remains to be seen what progress she will make, she will have to improve much more before she can be a candidate for CIR - Currently requiring NG for feeds/meds - likely transition to PEG per discussion with husband above    HTN - Continue Amlodipine , Carvedilol  - blood pressure currently well controlled    S/p hernia repair, elective 12/3 S/p ex-lap 12/4, 12/5 - Postoperative management per CCS - Wound care per surgery - Dysphagia -continue tube feeds via core track, speech following to advance as she improves   Acute kidney injury resolving  Likely ATN due to shock/contrast History of left renal artery stenosis -Blood pressure well controlled - continue free water  per dietary via tube feeds   Acute  respiratory insufficiency , extubated 12/11 HCAP -Enterobacter 12/6 - Completed therapy - weaned to room air - previously extubated as above  DVT prophylaxis: heparin  injection 5,000 Units Start: 10/28/24 2200 SCD's Start: 10/23/24 1918 Code Status:   Code Status: Full Code Family Communication: Husband at bedside  Status is: Inpt  Dispo: The patient is from: Home              Anticipated d/c is to: TBD              Anticipated d/c date is: TBD              Patient currently NOT medically stable for discharge  Consultants:  PCCM, General surgery, Neuro  Procedures:  Hernia repair 12/3 and ex-lap 12/5   Antimicrobials:  None(completed prior course)   Subjective: No acute issues/events overnight - ROS limited given patient mental status  Objective: Vitals:   11/04/24 0400 11/04/24 0500 11/04/24 0600 11/04/24 0700  BP: (!) 166/58 (!) 179/72 (!) 165/99 (!) 188/64  Pulse: 66 92 (!) 115 64  Resp:  (!) 22 (!) 23 15  Temp: 98.1 F (36.7 C)     TempSrc: Axillary     SpO2:  96% 95% 96%  Weight: 48.4 kg     Height:        Intake/Output Summary (Last 24 hours) at 11/04/2024 0811 Last data filed at 11/04/2024 0700 Gross per 24 hour  Intake 1914.71 ml  Output 2300 ml  Net -385.29 ml   Filed Weights   11/02/24 0500 11/03/24 0434 11/04/24 0400  Weight: 49.5 kg 50.9 kg 48.4 kg    Examination:  General exam: Appears calm and comfortable  Respiratory system: Clear to auscultation. Respiratory effort normal. Cardiovascular system: S1 & S2 heard, RRR. No JVD, murmurs, rubs, gallops or clicks. No pedal edema. Gastrointestinal system: Abdomen is nondistended, soft and nontender. Post operative bandage clean/dry/intact Central nervous system: Somnolent - difficult to arouse - pupils equal/reactive, physical exam limited by mental status/stroke Psychiatry: Unable to evaluate given mental status     Data Reviewed: I have personally reviewed following labs and imaging  studies  CBC: Recent Labs  Lab 10/29/24 0530 10/30/24 1240 10/31/24 0225 11/01/24 0403 11/02/24 0237 11/03/24 0438 11/04/24 0436  WBC 8.9   < > 14.6* 14.8* 13.4* 9.5 10.4  NEUTROABS 7.5  --   --   --   --   --  7.8*  HGB 8.6*   < > 9.7* 8.3* 8.9* 8.7* 8.9*  HCT 26.5*   < > 29.9* 26.2* 26.7* 27.1* 28.1*  MCV 94.0   < > 94.0 94.2 93.0 93.8 96.9  PLT 116*   < > 191 252 310 414* 476*   < > = values in this interval not displayed.   Basic Metabolic Panel: Recent Labs  Lab 10/31/24 0225 11/01/24 0403 11/02/24 0237 11/03/24 0438 11/04/24 0436  NA 146* 141 137 138 141  K 3.9 3.0* 3.3* 3.3* 4.3  CL 107 107 106 108 113*  CO2 26 24 23  20* 19*  GLUCOSE 124* 145* 119* 113* 99  BUN 49* 46* 39* 37* 34*  CREATININE 2.19* 2.02* 1.68* 1.57* 1.50*  CALCIUM  7.6* 7.3* 7.4* 7.8* 8.1*  MG 1.9 1.5* 2.1 1.8 1.9  PHOS 2.8 2.4* 3.2 3.4 3.5   GFR: Estimated Creatinine Clearance: 27.4  mL/min (A) (by C-G formula based on SCr of 1.5 mg/dL (H)).  Liver Function Tests: Recent Labs  Lab 10/29/24 0530  AST 73*  ALT 23  ALKPHOS 133*  BILITOT 0.3  PROT 4.8*  ALBUMIN  2.2*  2.2*    Recent Labs  Lab 10/29/24 1301  AMMONIA 14   Coagulation Profile: Recent Labs  Lab 10/29/24 0530  INR 1.1   CBG: Recent Labs  Lab 11/03/24 1132 11/03/24 1540 11/03/24 1933 11/03/24 2326 11/04/24 0325  GLUCAP 103* 118* 115* 113* 109*    Sepsis Labs: Recent Labs  Lab 10/31/24 0225  PROCALCITON 3.88    Recent Results (from the past 240 hours)  Culture, Respiratory w Gram Stain     Status: None   Collection Time: 10/26/24  6:25 PM   Specimen: Tracheal Aspirate; Respiratory  Result Value Ref Range Status   Specimen Description   Final    TRACHEAL ASPIRATE Performed at [facility], [address], [place], [place] [address]    Special Requests   Final    NONE Performed at [facility], [address] [place], [place] [address]    Gram Stain   Final     MODERATE WBC PRESENT,BOTH PMN AND MONONUCLEAR ABUNDANT GRAM NEGATIVE RODS FEW GRAM POSITIVE COCCI Performed at [facility], [address], [place], [place] [address]    Culture ABUNDANT ENTEROBACTER CLOACAE  Final   Report Status 10/28/2024 FINAL  Final   Organism ID, Bacteria ENTEROBACTER CLOACAE  Final      Susceptibility   Enterobacter cloacae - MIC*    CEFEPIME  <=0.12 SENSITIVE Sensitive     ERTAPENEM <=0.12 SENSITIVE Sensitive     CIPROFLOXACIN <=0.06 SENSITIVE Sensitive     GENTAMICIN <=1 SENSITIVE Sensitive     MEROPENEM <=0.25 SENSITIVE Sensitive     TRIMETH/SULFA <=20 SENSITIVE Sensitive     PIP/TAZO Value in next row Sensitive      <=4 SENSITIVEThis is a modified FDA-approved test that has been validated and its performance characteristics determined by the reporting laboratory.  This laboratory is certified under the Clinical Laboratory Improvement Amendments CLIA as qualified to perform high complexity clinical laboratory testing.    * ABUNDANT ENTEROBACTER CLOACAE  Culture, Respiratory w Gram Stain     Status: None   Collection Time: 10/29/24 11:46 AM   Specimen: Tracheal Aspirate; Respiratory  Result Value Ref Range Status   Specimen Description TRACHEAL ASPIRATE  Final   Special Requests NONE  Final   Gram Stain   Final    ABUNDANT WBC PRESENT, PREDOMINANTLY PMN RARE GRAM NEGATIVE RODS RARE GRAM POSITIVE COCCI IN PAIRS    Culture   Final    FEW Normal respiratory flora-no Staph aureus or Pseudomonas seen Performed at [facility], [address] [place], [place] [address]    Report Status 10/31/2024 FINAL  Final  MRSA Next Gen by PCR, Nasal     Status: None   Collection Time: 10/29/24 12:11 PM   Specimen: Nasal Mucosa; Nasal Swab  Result Value Ref Range Status   MRSA by PCR Next Gen NOT DETECTED NOT DETECTED Final    Comment: (NOTE) The GeneXpert MRSA Assay (FDA approved for NASAL specimens only), is one component of a comprehensive MRSA  colonization surveillance program. It is not intended to diagnose MRSA infection nor to guide or monitor treatment for MRSA infections. Test performance is not FDA approved in patients less than 68 years old. Performed at [facility], [address], [place], [place] [address]   Gastrointestinal Panel by PCR , Stool     Status: None   Collection Time: 11/02/24  4:03 AM   Specimen: Stool  Result Value Ref Range Status   Campylobacter species NOT DETECTED NOT DETECTED Final   Plesimonas shigelloides NOT DETECTED NOT DETECTED Final   Salmonella species NOT DETECTED NOT DETECTED Final   Yersinia enterocolitica NOT DETECTED NOT DETECTED Final   Vibrio species NOT DETECTED NOT DETECTED Final   Vibrio cholerae NOT DETECTED NOT DETECTED Final   Enteroaggregative E coli (EAEC) NOT DETECTED NOT DETECTED Final   Enteropathogenic E coli (EPEC) NOT DETECTED NOT DETECTED Final   Enterotoxigenic E coli (ETEC) NOT DETECTED NOT DETECTED Final   Shiga like toxin producing E coli (STEC) NOT DETECTED NOT DETECTED Final   Shigella/Enteroinvasive E coli (EIEC) NOT DETECTED NOT DETECTED Final   Cryptosporidium NOT DETECTED NOT DETECTED Final   Cyclospora cayetanensis NOT DETECTED NOT DETECTED Final   Entamoeba histolytica NOT DETECTED NOT DETECTED Final   Giardia lamblia NOT DETECTED NOT DETECTED Final   Adenovirus F40/41 NOT DETECTED NOT DETECTED Final   Astrovirus NOT DETECTED NOT DETECTED Final   Norovirus GI/GII NOT DETECTED NOT DETECTED Final   Rotavirus A NOT DETECTED NOT DETECTED Final   Sapovirus (I, II, IV, and V) NOT DETECTED NOT DETECTED Final    Comment: Performed at [facility], [address] [place], [place] [address]  C Difficile Quick Screen (NO PCR Reflex)     Status: None   Collection Time: 11/02/24 12:23 PM   Specimen: STOOL  Result Value Ref Range Status   C Diff antigen NEGATIVE NEGATIVE Final   C Diff toxin NEGATIVE NEGATIVE Final   C Diff interpretation No C.  difficile detected.  Final    Comment: Performed at [facility], [address] [place], [place] [address]         Radiology Studies: No results found.      Scheduled Meds:  amLODipine   5 mg Per Tube Daily   aspirin   81 mg Per Tube Daily   carvedilol   12.5 mg Per Tube BID WC   Chlorhexidine  Gluconate Cloth  6 each Topical Daily   fiber  1 packet Per Tube BID   free water   200 mL Per Tube Q8H   Gerhardt's butt cream   Topical BID   heparin  injection (subcutaneous)  5,000 Units Subcutaneous Q8H   Influenza vac split trivalent PF  0.5 mL Intramuscular Tomorrow-1000   levothyroxine   75 mcg Per Tube Q0600   mouth rinse  15 mL Mouth Rinse 4 times per day   pantoprazole  (PROTONIX ) IV  40 mg Intravenous QHS   Continuous Infusions:  feeding supplement (VITAL AF 1.2 CAL) 50 mL/hr at 11/04/24 0700     LOS: 12 days   Time spent:  Elsie JAYSON Montclair, DO [facility]  If 7PM-7AM, please contact night-coverage [email or web address]  11/04/2024, 8:11 AM

## 2024-11-04 NOTE — Progress Notes (Addendum)
 STROKE TEAM PROGRESS NOTE    SIGNIFICANT HOSPITAL EVENTS 12/3 - hernia repair 12/4 taken back to the OR for hemoperitoneum-active bleed was not identified, abdomen packed with wound VAC in place 12/5 back to the OR for an open abdomen-no evidence of ongoing bleeding and abdomen was closed 12/8 - CT Head and MRI brain with bil strokes 12/11-extubated  INTERIM HISTORY/SUBJECTIVE Patient is seen in her room with no family at the bedside.  She has undergone her bath and physical therapy and appears drowsy.  Blood pressure has been stable on the high end, and patient remains afebrile  OBJECTIVE  CBC    Component Value Date/Time   WBC 10.4 11/04/2024 0436   RBC 2.90 (L) 11/04/2024 0436   HGB 8.9 (L) 11/04/2024 0436   HGB 11.9 11/08/2019 1403   HGB 10.6 (L) 11/28/2013 1148   HCT 28.1 (L) 11/04/2024 0436   HCT 35.7 11/08/2019 1403   HCT 31.2 (L) 11/28/2013 1148   PLT 476 (H) 11/04/2024 0436   PLT 250 11/08/2019 1403   MCV 96.9 11/04/2024 0436   MCV 99 (H) 11/08/2019 1403   MCV 101.4 (H) 11/28/2013 1148   MCH 30.7 11/04/2024 0436   MCHC 31.7 11/04/2024 0436   RDW 15.7 (H) 11/04/2024 0436   RDW 11.8 11/08/2019 1403   RDW 15.4 (H) 11/28/2013 1148   LYMPHSABS 0.9 11/04/2024 0436   LYMPHSABS 1.1 11/08/2019 1403   LYMPHSABS 0.8 (L) 11/28/2013 1148   MONOABS 1.2 (H) 11/04/2024 0436   MONOABS 0.9 11/28/2013 1148   EOSABS 0.1 11/04/2024 0436   EOSABS 0.0 11/08/2019 1403   BASOSABS 0.0 11/04/2024 0436   BASOSABS 0.0 11/08/2019 1403   BASOSABS 0.0 11/28/2013 1148    BMET    Component Value Date/Time   NA 141 11/04/2024 0436   NA 137 07/01/2024 1505   NA 139 04/22/2014 1028   K 4.3 11/04/2024 0436   K 4.1 04/22/2014 1028   CL 113 (H) 11/04/2024 0436   CO2 19 (L) 11/04/2024 0436   CO2 23 04/22/2014 1028   GLUCOSE 99 11/04/2024 0436   GLUCOSE 81 04/22/2014 1028   BUN 34 (H) 11/04/2024 0436   BUN 20 07/01/2024 1505   BUN 8.8 04/22/2014 1028   CREATININE 1.50 (H) 11/04/2024  0436   CREATININE 0.94 12/10/2021 1532   CREATININE 0.8 04/22/2014 1028   CALCIUM  8.1 (L) 11/04/2024 0436   CALCIUM  9.1 04/22/2014 1028   EGFR 38 (L) 07/01/2024 1505   GFRNONAA 38 (L) 11/04/2024 0436    IMAGING past 24 hours No results found.    Vitals:   11/04/24 0730 11/04/24 0800 11/04/24 0815 11/04/24 0853  BP: (!) 174/66 (!) 176/101 (!) 158/65   Pulse: 75 77 62 74  Resp: (!) 23 (!) 24 20   Temp:  97.8 F (36.6 C)    TempSrc:  Axillary    SpO2: 96% 96% 97% 96%  Weight:      Height:          Physical Exam  Constitutional: NAD  Eyes: No scleral injection.  Head: Normocephalic.  Cardiovascular: Normal rate and regular rhythm.  Respiratory: Regular, unlabored respirations on room air Skin: WDI.   Neuro : Mental Status: Patient is awake, alert, opens eyes to stimulus. Able to track people around the room.  No verbal output today Cranial Nerves: II: PERRL - 3mm brisk,  III,IV, VI: Eyes midline, tracks to the right and left VII: right facial droop X: Cough  intact XII: Tongue midline  Motor: Tone is normal. Bulk is normal.  Purposeful in all extremities  Generalized weakness Sensory: Moves to touch in all extremities  Cerebellar: Unable to perform  ASSESSMENT/PLAN  [name] is a 68 y.o. female with history of coronary artery disease, hypertension, hyperlipidemia, squamous cell carcinoma of the anus-admitted to the ICU was in the hospital for management of hemorrhagic shock with concern for DIC.  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  NIH on Admission 30  Stroke:  Bilateral areas of acute infarct in the cerebrum and cerebellum  Etiology likely due to profound intracranial hypoperfusion due to severe anemia and hypovolemic shock CT head - Interval development of multiple cortical and subcortical edema areas involving bilateral cerebral and cerebellar  hemispheres. These areas are highly suggestive of embolic infarcts MRI  Numerous scattered areas of restricted diffusion throughout the cerebral hemispheres and cerebellum concerning for acute infarcts of embolic etiology. MRA  2 mm inferiorly directed outpouching along the right supraclinoid ICA, most consistent with an infundibulum at the posterior communicating artery origin. A small aneurysm is less likely but not excluded. 2D Echo EF 60-65% 12/8- EEG- ABNORMALITY - Continuous slow, generalized IMPRESSION: This study is suggestive of generalized cerebral dysfunction ( encephalopathy). No seizures or epileptiform discharges were seen throughout the recording.  LDL 13 HgbA1c 4.9 VTE prophylaxis - Heparin  subcu No antithrombotic prior to admission, now on aspirin  81 mg daily  Therapy recommendations:  LTAC Disposition: Pending  S/p hernia repair 12/3 S/p ex lap 124, 12/5 S/p mesenteric artery stenting 10/14 As needed fentanyl , oxycodone  for pain management CCS following  Hemorrhagic shock ? DIC, ruled out Hx Hypertension Home meds:  Coreg , hydralazine , norvasc -on hold Stable BP goal normotensive (less than systolic 180, MAP goal 65) Episode of hypotension post op- found to have active arterial bleed 4U PRBCs, 1U FFP, 1U Cryo, IVF Off vasopressors Avoid low BP As needed labetalol  and hydralazine  for hypertension  Respiratory Failure Was intubated for procedure PCCM primary Extubated 12/11 So far tolerating well  Dysphagia Patient has post-stroke dysphagia, SLP consulted N.p.o. On NG tube On tube feeding @50   Other stroke risk factors Advanced age  Other medical issues AKI, Cre 2.88--2.65--2.43--2.26--2.19--2.02--1.50, nephro on board Hypokalemia, supplement  Hospital day # 12  Patient seen and examined by NP/APP with MD. MD to update note as needed.   Cortney E Everitt Clint Kill , MSN, AGACNP-BC [facility] See Amion for schedule and pager  information 11/04/2024 10:21 AM  I have personally obtained history,examined this patient, reviewed notes, independently viewed imaging studies, participated in medical decision making and plan of care.ROS completed by me personally and pertinent positives fully documented  I have made any additions or clarifications directly to the above note. Agree with note above.   Patient remains mostly mute but follows simple gestures and occasional simple commands.  Continue supportive care and physical occupational and speech therapy.  Transfer out of ICU to floor bed.  Continue aspirin  alone for stroke prevention.  No family available at the bedside for discussion.   I personally spent a total of 35 minutes in the care of the patient today including getting/reviewing separately obtained history, performing a medically appropriate exam/evaluation, counseling and educating, placing orders, referring and communicating with other health care professionals, documenting clinical information in the EHR, independently interpreting results, and coordinating care.        Eather Popp, MD Medical Director [facility] Pager: [phone] 11/04/2024 1:27 PM   To contact Stroke Continuity provider, please refer to [email or web address]. After hours, contact General Neurology

## 2024-11-04 NOTE — Progress Notes (Signed)
 Physical Therapy Treatment Patient Details Name: Erin [name] MRN: [id number] DOB: February 09, 1956 Today's Date: 11/04/2024   History of Present Illness 68 y.o. female presents to [facility] hospital on 10/29/2024 as a transfer from [place]. Pt underwent ventral hernia repair on 12/3. Pt required return to OR on 12/4 for ex-lap with finding of abdominal hemorrhage, remaining intubated post-procedure. OR 12/5 for removal of packing and abdominal closure. MRI on 12/8 concerning for multiple embolic infarcts. PMH includes anal cancer, hypothyroidism, GERD, CAD, cardiomyopathy, PVD, HLD, HTN, RLS.    PT Comments  Pt alert, tracking and moving extremities more today with automatic movement. Pt lifting RUE to touch nose and moving LUE to scratch right bicep with pt continuing to withdrawal legs when feet touched. Pt with improved sitting balance and UB activation to assist with positioning in sitting. Remains max-total assist for all transfers with [facility] still appropriate. Will continue to follow.   SPO2 96% on RA HR 74    If plan is discharge home, recommend the following: Two people to help with walking and/or transfers;Two people to help with bathing/dressing/bathroom;Assistance with cooking/housework;Assistance with feeding;Direct supervision/assist for medications management;Direct supervision/assist for financial management;Assist for transportation;Help with stairs or ramp for entrance;Supervision due to cognitive status   Can travel by private vehicle        Equipment Recommendations  [place] lift;Hospital bed;Wheelchair (measurements PT);Wheelchair cushion (measurements PT)    Recommendations for Other Services       Precautions / Restrictions Precautions Precautions: Fall Recall of Precautions/Restrictions: Impaired Precaution/Restrictions Comments: cortrak, rectal pouch     Mobility  Bed Mobility Overal bed mobility: Needs Assistance Bed Mobility: Supine to Sit, Sit to Supine,  Rolling Rolling: Max assist   Supine to sit: Total assist Sit to supine: Total assist   General bed mobility comments: max assist to roll bil for pad change and pericare. total assist of foot egress for supine<>sit. Once in sitting pt using UB to reposition sacrum RUE>LUE. Pt able to sit unsupported at EOB 1 min prior to fatigue and posterior lean    Transfers Overall transfer level: Needs assistance   Transfers: Sit to/from Stand Sit to Stand: Max assist           General transfer comment: max assist with knees blocked and sacral support to rise to standing x 3 trials, maintained trunk flexion    Ambulation/Gait               General Gait Details: unable   Stairs             Wheelchair Mobility     Tilt Bed    Modified Rankin (Stroke Patients Only) Modified Rankin (Stroke Patients Only) Pre-Morbid Rankin Score: No symptoms Modified Rankin: Severe disability     Balance Overall balance assessment: Needs assistance Sitting-balance support: No upper extremity supported, Feet unsupported Sitting balance-Leahy Scale: Fair Sitting balance - Comments: initial mod assist at EOB but then able to engage core once in midline and sit CGA for 1 min Postural control: Posterior lean Standing balance support: During functional activity Standing balance-Leahy Scale: Zero Standing balance comment: max assist in standing                            Communication Communication Communication: Impaired Factors Affecting Communication: Difficulty expressing self  Cognition Arousal: Alert Behavior During Therapy: Flat affect   PT - Cognitive impairments: Difficult to assess Difficult to assess due to: Impaired communication  PT - Cognition Comments: pt will visually track and respond with head nods grossly 25% of the time, automatice UB/LB movement with pt follwing cues for Rt hand squeeze 2/3 trials Following commands:  Impaired Following commands impaired: Follows one step commands inconsistently, Follows one step commands with increased time    Cueing Cueing Techniques: Tactile cues, Visual cues, Gestural cues, Verbal cues  Exercises      General Comments        Pertinent Vitals/Pain Pain Assessment Pain Assessment: CPOT Facial Expression: Relaxed, neutral Body Movements: Absence of movements Muscle Tension: Relaxed Compliance with ventilator (intubated pts.): N/A Vocalization (extubated pts.): Talking in normal tone or no sound CPOT Total: 0 Pain Intervention(s): Limited activity within patient's tolerance, Monitored during session, Repositioned    Home Living                          Prior Function            PT Goals (current goals can now be found in the care plan section) Progress towards PT goals: Progressing toward goals    Frequency    Min 2X/week      PT Plan      Co-evaluation              AM-PAC PT 6 Clicks Mobility   Outcome Measure  Help needed turning from your back to your side while in a flat bed without using bedrails?: Total Help needed moving from lying on your back to sitting on the side of a flat bed without using bedrails?: Total Help needed moving to and from a bed to a chair (including a wheelchair)?: Total Help needed standing up from a chair using your arms (e.g., wheelchair or bedside chair)?: Total Help needed to walk in hospital room?: Total Help needed climbing 3-5 steps with a railing? : Total 6 Click Score: 6    End of Session   Activity Tolerance: Patient tolerated treatment well Patient left: in bed;with call bell/phone within reach;with bed alarm set Nurse Communication: Mobility status;Need for lift equipment PT Visit Diagnosis: Other abnormalities of gait and mobility (R26.89);Muscle weakness (generalized) (M62.81);Other symptoms and signs involving the nervous system (R29.898)     Time: 9266-9195 PT Time  Calculation (min) (ACUTE ONLY): 31 min  Charges:    $Therapeutic Activity: 23-37 mins PT General Charges $$ ACUTE PT VISIT: 1 Visit                     Lenoard SQUIBB, PT Acute Rehabilitation Services Office: [phone]    Lenoard NOVAK Rudolph Daoust 11/04/2024, 8:57 AM

## 2024-11-04 NOTE — Progress Notes (Signed)
 10 Days Post-Op   Subjective/Chief Complaint: Pt with no acute events   Objective: Vital signs in last 24 hours: Temp:  [97.8 F (36.6 C)-98.9 F (37.2 C)] 98.1 F (36.7 C) (12/15 0400) Pulse Rate:  [63-102] 92 (12/15 0500) Resp:  [15-30] 22 (12/15 0500) BP: (141-182)/(58-145) 179/72 (12/15 0500) SpO2:  [94 %-97 %] 96 % (12/15 0500) Weight:  [48.4 kg] 48.4 kg (12/15 0400) Last BM Date : 11/03/24  Intake/Output from previous day: 12/14 0701 - 12/15 0700 In: 2291.7 [NG/GT:2091.7; IV Piggyback:200] Out: 2300 [Urine:1500; Stool:800] Intake/Output this shift: Total I/O In: 1090 [NG/GT:1040; IV Piggyback:50] Out: 1200 [Urine:700; Stool:500]  PE:   Constitutional: No acute distress,  Eyes: Anicteric sclerae, moist conjunctiva, Lungs: Clear to auscultation bilaterally, normal respiratory effort CV: regular rate and rhythm, no murmurs, no peripheral edema, pedal pulses 2+ GI: Soft, no masses or hepatosplenomegaly, non-tender to palpation, inc cdi Skin: No rashes, palpation reveals normal turgor Neuro: interact and talks with appropriate words  Lab Results:  Recent Labs    11/03/24 0438 11/04/24 0436  WBC 9.5 10.4  HGB 8.7* 8.9*  HCT 27.1* 28.1*  PLT 414* 476*   BMET Recent Labs    11/03/24 0438 11/04/24 0436  NA 138 141  K 3.3* 4.3  CL 108 113*  CO2 20* 19*  GLUCOSE 113* 99  BUN 37* 34*  CREATININE 1.57* 1.50*  CALCIUM  7.8* 8.1*   Anti-infectives: Anti-infectives (From admission, onward)    Start     Dose/Rate Route Frequency Ordered Stop   11/01/24 1200  piperacillin -tazobactam (ZOSYN ) IVPB 3.375 g        3.375 g 12.5 mL/hr over 240 Minutes Intravenous Every 8 hours 11/01/24 0822 11/04/24 0048   10/30/24 1400  piperacillin -tazobactam (ZOSYN ) IVPB 2.25 g  Status:  Discontinued        2.25 g 100 mL/hr over 30 Minutes Intravenous Every 8 hours 10/30/24 0939 11/01/24 0822   10/28/24 1600  ceFEPIme  (MAXIPIME ) 2 g in sodium chloride  0.9 % 100 mL IVPB  Status:   Discontinued        2 g 200 mL/hr over 30 Minutes Intravenous Every 24 hours 10/28/24 1441 10/30/24 0939   10/26/24 1800  cefTRIAXone  (ROCEPHIN ) 2 g in sodium chloride  0.9 % 100 mL IVPB  Status:  Discontinued        2 g 200 mL/hr over 30 Minutes Intravenous Daily-1800 10/26/24 1739 10/28/24 1441   10/23/24 0700  ceFAZolin  (ANCEF ) IVPB 2g/100 mL premix        2 g 200 mL/hr over 30 Minutes Intravenous On call to O.R. 10/23/24 9351 10/23/24 9166       Assessment/Plan: s/p POD 10 from lap VHR and reexploration for bleeding, POD 8 from reexploration and abdominal closure -Cr returning to baseline -Appreciate CCM assistance.  Doing well from pulm standpoint -TF @ goal, SLP working with pt -On ASA for CVA, Pt is becoming more interactive, appreciate Neuro assistance .   Overall improving  LOS: 12 days    Erin [name] 11/04/2024

## 2024-11-04 NOTE — Progress Notes (Signed)
 Speech Language Pathology Treatment: Dysphagia  Patient Details Name: Erin [name] MRN: [id number] DOB: April 28, 1956 Today's Date: 11/04/2024 Time: 8845-8794 SLP Time Calculation (min) (ACUTE ONLY): 11 min  Assessment / Plan / Recommendation Clinical Impression  Pt lethargic though still orally accepted ice chip trials when offered. SLP hopeful that ice chip trials would prompt increased alertness; however, no significant change in alertness as trials progressed. Oral prep and transit observed to be moderately prolonged with ice chip trials. Pharyngeal swallow initiation observed. No overt or subtle s/s of aspiration noted. Pt was not alert enough to safely try further PO trials today. SLP will continue to follow.   Continue NPO with the exception of ice chips for oral comfort following oral care.    HPI HPI: Erin [name] is a 68 y.o. F  adm to [facility] on 12/3 for ventral hernia repair. 12/4 ex lap with abdominal hemorrhage and remained intubated post op. 12/5 abdominal closure. 12/8 MRI with multiple embolic infarcts. 12/9 transfer to [facility]. 12/11 extubated. PMHx: anal CA, hypothyroidism, GERD, CAD, cardiomyopathy, PVD, HLD, HTN, RLS. SLP consulted for cogntitive-linguistic assessment and clinical swallow assessment. Pt with a hx of mild pharyngeal dysphagia and suspected primary esophageal dysphagia per MBSS completed 05/06/22      SLP Plan  Continue with current plan of care         Recommendations  Diet recommendations: NPO (ok for ice chips) Medication Administration: Via alternative means Compensations: Slow rate;Minimize environmental distractions Postural Changes and/or Swallow Maneuvers: Seated upright 90 degrees;Upright 30-60 min after meal                  Oral care QID;Oral care prior to ice chip/H20   Frequent or constant Supervision/Assistance Dysphagia, unspecified (R13.10);Cognitive communication deficit (R41.841);Aphasia (R47.01)     Continue with current plan  of care     Erin [name]  11/04/2024, 1:25 PM

## 2024-11-05 DIAGNOSIS — D649 Anemia, unspecified: Secondary | ICD-10-CM | POA: Diagnosis not present

## 2024-11-05 DIAGNOSIS — R571 Hypovolemic shock: Secondary | ICD-10-CM | POA: Diagnosis not present

## 2024-11-05 DIAGNOSIS — I6340 Cerebral infarction due to embolism of unspecified cerebral artery: Secondary | ICD-10-CM | POA: Diagnosis not present

## 2024-11-05 DIAGNOSIS — I63443 Cerebral infarction due to embolism of bilateral cerebellar arteries: Secondary | ICD-10-CM | POA: Diagnosis not present

## 2024-11-05 DIAGNOSIS — G9340 Encephalopathy, unspecified: Secondary | ICD-10-CM

## 2024-11-05 LAB — GLUCOSE, CAPILLARY
Glucose-Capillary: 102 mg/dL — ABNORMAL HIGH (ref 70–99)
Glucose-Capillary: 110 mg/dL — ABNORMAL HIGH (ref 70–99)
Glucose-Capillary: 114 mg/dL — ABNORMAL HIGH (ref 70–99)
Glucose-Capillary: 116 mg/dL — ABNORMAL HIGH (ref 70–99)
Glucose-Capillary: 121 mg/dL — ABNORMAL HIGH (ref 70–99)
Glucose-Capillary: 122 mg/dL — ABNORMAL HIGH (ref 70–99)

## 2024-11-05 MED ORDER — MELATONIN 3 MG PO TABS: 3.0000 mg | ORAL_TABLET | Freq: Every day | ORAL | Status: DC

## 2024-11-05 MED ORDER — MELATONIN 3 MG PO TABS
3.0000 mg | ORAL_TABLET | Freq: Every day | ORAL | Status: DC
  Administered 2024-11-06 – 2024-11-08 (×3): 3 mg
  Filled 2024-11-05 (×3): qty 1

## 2024-11-05 MED ORDER — AMLODIPINE BESYLATE 10 MG PO TABS
10.0000 mg | ORAL_TABLET | Freq: Every day | ORAL | Status: DC
  Administered 2024-11-05 – 2024-11-24 (×19): 10 mg
  Filled 2024-11-05 (×13): qty 1

## 2024-11-05 NOTE — Progress Notes (Signed)
 PROGRESS NOTE    Erin [name]  FMW:[id number] DOB: [birth year] DOA: 10/23/2024 PCP: [name] [name], MD   Brief Narrative:  68 year old woman who presented to [facility] 12/3 for elective hernia repair. 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  Patient admitted initially for postop hernia repair found to have hemoperitoneum with hemorrhagic shock postoperatively requiring intubation and pressors.  Her course was subsequently complicated by bilateral strokes thought to be provoked by hypotension.  At this time patient is stabilizing, she has been extubated, continues tube feeds via NG and is otherwise stable from a surgical standpoint.  Transition out of the ICU to hospitalist team on 12/15 with plan for ongoing care with ultimate plan for disposition pending patient's clinical course.  Hospital course 12/3 admitted to ICU postop 12/3-post hernia repair 12/4 taken back to the OR for hemoperitoneum-active bleed was not identified, abdomen packed with wound VAC in place 12/5 back to the OR for an open abdomen-no evidence of ongoing bleeding and abdomen was closed 12/8 - CT Head with interval development (since 04/2024) of multiple cortical and subcortical hypodensities in bilateral cerebellar and cerebral hemispheres suggestive of acute/subacute infarcts. MRI Brain with numerous areas of restricted diffusion throughout the cerebral hemispheres/cerebellum c/f acute infarcts of embolic etiology, associated mild edema (R occipital lobe, L temporal lobe).  12/11 tolerating PSV 5/5 >> extubated 12/15 transition to TRH team - goals of care discussion with husband(see below)   Assessment & Plan:   Principal Problem:   S/P hernia repair Active Problems:   Shock  (HCC)   Malnutrition of moderate degree  Goals of care Lengthy discussion with husband at bedside, he understands that patient has a long road ahead of her, that her NG tube will likely need to be transition to a formal PEG tube unless she has a marked improvement in n.p.o. in the next week which is unlikely.  We also discussed potentially if patient were to continue to be in her current state which is essentially nonverbal and poorly interactive will transitioning to a palliative care plan be of any interest.  Patient and husband previously had worked in patient care and he is comfortable taking the patient home even if she required increased levels of care given his previous knowledge.  At this time continue full scope of care patient remains full code, plan for PEG tube placement in the next few days if p.o. intake and mental status do not improve.  SHOCK - likely hemorrhagic - Complicated by postoperative complications and hemoperitoneum - Remains off pressors  Acute bilateral cerebral and cerebellar infarcts CT head with multiple cortical and subcortical hypodensities Neuro consulted MRI brain confirms acute infarct - likely embolic - Continue ASA 81 - PT/OT/SLP >> total assist , remains to be seen what progress she will make, she will have to improve much more before she can be a candidate for CIR - Currently requiring NG for feeds/meds - likely transition to PEG per discussion with husband above    HTN - Continue Amlodipine , Carvedilol  - blood pressure currently well controlled    S/p hernia repair, elective 12/3 S/p ex-lap 12/4, 12/5 - Postoperative management per CCS - Wound care per surgery - Dysphagia -continue tube feeds via core track, speech following to advance as she improves   Acute kidney injury resolving  Likely ATN due to shock/contrast History of left renal artery stenosis -Blood pressure well controlled - continue free water  per dietary via tube feeds   Acute  respiratory insufficiency , extubated 12/11 HCAP -Enterobacter 12/6 - Completed therapy - weaned to room air - previously extubated as above  DVT prophylaxis: heparin  injection 5,000 Units Start: 10/28/24 2200 SCD's Start: 10/23/24 1918 Code Status:   Code Status: Full Code Family Communication: Husband at bedside  Status is: Inpt  Dispo: The patient is from: Home              Anticipated d/c is to: TBD - LTACH appropriate per discussion with PT/OT/CM              Anticipated d/c date is: TBD              Patient currently NOT medically stable for discharge  Consultants:  PCCM, General surgery, Neuro  Procedures:  Hernia repair 12/3 and ex-lap 12/5   Antimicrobials:  None(completed prior course)   Subjective: No acute issues/events overnight - ROS limited given patient mental status  Objective: Vitals:   11/05/24 0630 11/05/24 0700 11/05/24 0730 11/05/24 0800  BP: (!) 160/129 (!) 169/84 (!) 173/71 (!) 171/60  Pulse: 89 100 96 88  Resp: (!) 26  (!) 30 (!) 21  Temp:      TempSrc:      SpO2: 96% 97% 96% 96%  Weight:      Height:        Intake/Output Summary (Last 24 hours) at 11/05/2024 0804 Last data filed at 11/05/2024 0800 Gross per 24 hour  Intake 1860 ml  Output 830 ml  Net 1030 ml   Filed Weights   11/03/24 0434 11/04/24 0400 11/05/24 0500  Weight: 50.9 kg 48.4 kg 49.4 kg    Examination:  General exam: Appears calm and comfortable  Respiratory system: Clear to auscultation. Respiratory effort normal. Cardiovascular system: S1 & S2 heard, RRR. No JVD, murmurs, rubs, gallops or clicks. No pedal edema. Gastrointestinal system: Abdomen is nondistended, soft and nontender. Post operative bandage clean/dry/intact Central nervous system: Somnolent - difficult to arouse - pupils equal/reactive, physical exam limited by mental status/stroke Psychiatry: Unable to evaluate given mental status     Data Reviewed: I have personally reviewed following labs and  imaging studies  CBC: Recent Labs  Lab 10/31/24 0225 11/01/24 0403 11/02/24 0237 11/03/24 0438 11/04/24 0436  WBC 14.6* 14.8* 13.4* 9.5 10.4  NEUTROABS  --   --   --   --  7.8*  HGB 9.7* 8.3* 8.9* 8.7* 8.9*  HCT 29.9* 26.2* 26.7* 27.1* 28.1*  MCV 94.0 94.2 93.0 93.8 96.9  PLT 191 252 310 414* 476*   Basic Metabolic Panel: Recent Labs  Lab 10/31/24 0225 11/01/24 0403 11/02/24 0237 11/03/24 0438 11/04/24 0436  NA 146* 141 137 138 141  K 3.9 3.0* 3.3* 3.3* 4.3  CL 107 107 106 108 113*  CO2 26 24 23  20* 19*  GLUCOSE 124* 145* 119* 113* 99  BUN 49* 46* 39* 37* 34*  CREATININE 2.19* 2.02* 1.68* 1.57* 1.50*  CALCIUM  7.6* 7.3* 7.4* 7.8* 8.1*  MG 1.9 1.5* 2.1 1.8 1.9  PHOS 2.8 2.4* 3.2 3.4 3.5   GFR: Estimated Creatinine Clearance: 28 mL/min (A) (by C-G formula based on SCr of 1.5 mg/dL (H)).  Liver Function Tests: No results for input(s): AST, ALT, ALKPHOS, BILITOT, PROT, ALBUMIN  in the last 168 hours.   Recent Labs  Lab 10/29/24 1301  AMMONIA  14   Coagulation Profile: No results for input(s): INR, PROTIME in the last 168 hours.  CBG: Recent Labs  Lab 11/04/24 1538 11/04/24 1939 11/04/24 2333 11/05/24 0345 11/05/24 0756  GLUCAP 108* 113* 100* 114* 122*    Sepsis Labs: Recent Labs  Lab 10/31/24 0225  PROCALCITON 3.88    Recent Results (from the past 240 hours)  Culture, Respiratory w Gram Stain     Status: None   Collection Time: 10/26/24  6:25 PM   Specimen: Tracheal Aspirate; Respiratory  Result Value Ref Range Status   Specimen Description   Final    TRACHEAL ASPIRATE Performed at [facility], [address], [place], [place] [address]    Special Requests   Final    NONE Performed at [facility], [address], [place], [place] [address]    Gram Stain   Final    MODERATE WBC PRESENT,BOTH PMN AND MONONUCLEAR ABUNDANT GRAM NEGATIVE RODS FEW GRAM POSITIVE COCCI Performed at [facility], [address], [place], [place] [address]    Culture ABUNDANT ENTEROBACTER CLOACAE  Final   Report Status 10/28/2024 FINAL  Final   Organism ID, Bacteria ENTEROBACTER CLOACAE  Final      Susceptibility   Enterobacter cloacae - MIC*    CEFEPIME  <=0.12 SENSITIVE Sensitive     ERTAPENEM <=0.12 SENSITIVE Sensitive     CIPROFLOXACIN <=0.06 SENSITIVE Sensitive     GENTAMICIN <=1 SENSITIVE Sensitive     MEROPENEM <=0.25 SENSITIVE Sensitive     TRIMETH/SULFA <=20 SENSITIVE Sensitive     PIP/TAZO Value in next row Sensitive      <=4 SENSITIVEThis is a modified FDA-approved test that has been validated and its performance characteristics determined by the reporting laboratory.  This laboratory is certified under the Clinical Laboratory Improvement Amendments CLIA as qualified to perform high complexity clinical laboratory testing.    * ABUNDANT ENTEROBACTER CLOACAE  Culture, Respiratory w Gram Stain     Status: None   Collection Time: 10/29/24 11:46 AM   Specimen: Tracheal Aspirate; Respiratory  Result Value Ref Range Status   Specimen Description TRACHEAL ASPIRATE  Final   Special Requests NONE  Final   Gram Stain   Final    ABUNDANT WBC PRESENT, PREDOMINANTLY PMN RARE GRAM NEGATIVE RODS RARE GRAM POSITIVE COCCI IN PAIRS    Culture   Final    FEW Normal respiratory flora-no Staph aureus or Pseudomonas seen Performed at [facility], [address] [place], [place] [address]    Report Status 10/31/2024 FINAL  Final  MRSA Next Gen by PCR, Nasal     Status: None   Collection Time: 10/29/24 12:11 PM   Specimen: Nasal Mucosa; Nasal Swab  Result Value Ref Range Status   MRSA by PCR Next Gen NOT DETECTED NOT DETECTED Final    Comment: (NOTE) The GeneXpert MRSA Assay (FDA approved for NASAL specimens only), is one component of a comprehensive MRSA colonization surveillance program. It is not intended to diagnose MRSA infection nor to guide or monitor treatment for MRSA  infections. Test performance is not FDA approved in patients less than 43 years old. Performed at [facility], [address], [place], [place] [address]   Gastrointestinal Panel by PCR , Stool     Status: None   Collection Time: 11/02/24  4:03 AM   Specimen: Stool  Result Value Ref Range Status   Campylobacter species NOT DETECTED NOT DETECTED Final  Plesimonas shigelloides NOT DETECTED NOT DETECTED Final   Salmonella species NOT DETECTED NOT DETECTED Final   Yersinia enterocolitica NOT DETECTED NOT DETECTED Final   Vibrio species NOT DETECTED NOT DETECTED Final   Vibrio cholerae NOT DETECTED NOT DETECTED Final   Enteroaggregative E coli (EAEC) NOT DETECTED NOT DETECTED Final   Enteropathogenic E coli (EPEC) NOT DETECTED NOT DETECTED Final   Enterotoxigenic E coli (ETEC) NOT DETECTED NOT DETECTED Final   Shiga like toxin producing E coli (STEC) NOT DETECTED NOT DETECTED Final   Shigella/Enteroinvasive E coli (EIEC) NOT DETECTED NOT DETECTED Final   Cryptosporidium NOT DETECTED NOT DETECTED Final   Cyclospora cayetanensis NOT DETECTED NOT DETECTED Final   Entamoeba histolytica NOT DETECTED NOT DETECTED Final   Giardia lamblia NOT DETECTED NOT DETECTED Final   Adenovirus F40/41 NOT DETECTED NOT DETECTED Final   Astrovirus NOT DETECTED NOT DETECTED Final   Norovirus GI/GII NOT DETECTED NOT DETECTED Final   Rotavirus A NOT DETECTED NOT DETECTED Final   Sapovirus (I, II, IV, and V) NOT DETECTED NOT DETECTED Final    Comment: Performed at [facility], [address] [place], [place] [address]  C Difficile Quick Screen (NO PCR Reflex)     Status: None   Collection Time: 11/02/24 12:23 PM   Specimen: STOOL  Result Value Ref Range Status   C Diff antigen NEGATIVE NEGATIVE Final   C Diff toxin NEGATIVE NEGATIVE Final   C Diff interpretation No C. difficile detected.  Final    Comment: Performed at [facility], [address], [place], [place] [address]          Radiology Studies: No results found.      Scheduled Meds:  amLODipine   5 mg Per Tube Daily   aspirin   81 mg Per Tube Daily   carvedilol   12.5 mg Per Tube BID WC   Chlorhexidine  Gluconate Cloth  6 each Topical Daily   fiber  1 packet Per Tube BID   free water   200 mL Per Tube Q8H   Gerhardt's butt cream   Topical BID   heparin  injection (subcutaneous)  5,000 Units Subcutaneous Q8H   Influenza vac split trivalent PF  0.5 mL Intramuscular Tomorrow-1000   levothyroxine   75 mcg Per Tube Q0600   mouth rinse  15 mL Mouth Rinse 4 times per day   pantoprazole  (PROTONIX ) IV  40 mg Intravenous QHS   Continuous Infusions:  feeding supplement (VITAL AF 1.2 CAL) 50 mL/hr at 11/05/24 0800     LOS: 13 days   Time spent:  Elsie JAYSON Montclair, DO [facility]  If 7PM-7AM, please contact night-coverage [email or web address]  11/05/2024, 8:04 AM

## 2024-11-05 NOTE — Plan of Care (Signed)
°  Problem: Activity: Goal: Risk for activity intolerance will decrease Outcome: Progressing   Problem: Nutrition: Goal: Adequate nutrition will be maintained Outcome: Progressing   Problem: Elimination: Goal: Will not experience complications related to bowel motility Outcome: Progressing   Problem: Pain Managment: Goal: General experience of comfort will improve and/or be controlled Outcome: Progressing   Problem: Activity: Goal: Ability to tolerate increased activity will improve Outcome: Progressing

## 2024-11-05 NOTE — Progress Notes (Signed)
 Occupational Therapy Treatment Patient Details Name: Erin [name] MRN: [id number] DOB: 06-20-1956 Today's Date: 11/05/2024   History of present illness 68 y.o. female presents to [facility] hospital on 10/29/2024 as a transfer from [place]. Pt underwent ventral hernia repair on 12/3. Pt required return to OR on 12/4 for ex-lap with finding of abdominal hemorrhage, remaining intubated post-procedure. OR 12/5 for removal of packing and abdominal closure. MRI on 12/8 concerning for multiple embolic infarcts. PMH includes anal cancer, hypothyroidism, GERD, CAD, cardiomyopathy, PVD, HLD, HTN, RLS.   OT comments  Pt lethargic, awakens to name but does not sustain alertness.   Improved response with upright to chair position or when spouse engaged with pt.  She is moving both UES today,  limited by weakness and coordination.  Pt resisting movement at times when engaged in ADLs, she responds several times saying what? Or no and then closes her eyes. She remains total care for Adls and bed mobility. RN notified pt with several attempts to pull at cortrack with R UE. Educated spouse on using pictures and increased engagement during day in hopes to keep more alert during daytime.  Will follow acutely, continues to need long term OT at follow.       If plan is discharge home, recommend the following:  Other (comment) (total care)   Equipment Recommendations  Hospital bed;Hoyer lift;Wheelchair (measurements OT);Wheelchair cushion (measurements OT)    Recommendations for Other Services      Precautions / Restrictions Precautions Precautions: Fall Recall of Precautions/Restrictions: Impaired Precaution/Restrictions Comments: cortrak, rectal pouch Restrictions Weight Bearing Restrictions Per Provider Order: No       Mobility Bed Mobility Overal bed mobility: Needs Assistance             General bed mobility comments: bed to chair position, total assist    Transfers                          Balance Overall balance assessment: Needs assistance Sitting-balance support: No upper extremity supported, Feet unsupported Sitting balance-Leahy Scale: Fair Sitting balance - Comments: R lateral lean preference with upright in bed                                   ADL either performed or assessed with clinical judgement   ADL Overall ADL's : Needs assistance/impaired     Grooming: Wash/dry hands;Wash/dry face;Oral care;Sitting;Bed level Grooming Details (indicate cue type and reason): chair position in bed, hand over hand total assist R UE.  Pt resistant to brushing teeth initally                             Functional mobility during ADLs: Total assistance;+2 for physical assistance General ADL Comments: total care    Extremity/Trunk Assessment Upper Extremity Assessment Upper Extremity Assessment: RUE deficits/detail;LUE deficits/detail RUE Deficits / Details: full PROM, attempting to actively using UE to reach and grab cortrack. not followign many commands but moving UE.  Repsonds to noxious stimuli. RUE Coordination: decreased fine motor;decreased gross motor LUE Deficits / Details: full PROM. not following many commands but moving UE. Repsonds to noxious stimuli. LUE Coordination: decreased gross motor;decreased fine motor   Lower Extremity Assessment Lower Extremity Assessment: Defer to PT evaluation        Vision   Additional Comments: pt scanning to locate therapist around  room.  looking to L and R without difficulty and coordinated eye movement   Perception     Praxis     Communication Communication Communication: Impaired Factors Affecting Communication: Difficulty expressing self   Cognition Arousal: Lethargic Behavior During Therapy: Flat affect Cognition: Difficult to assess Difficult to assess due to: Level of arousal           OT - Cognition Comments: pt opening her eyes at times to name but unable to  sustain alertness.  improved engagement with spouse present and when upright in chair position.  She does follow some intermittent commands, resists movement at times, and voices what? or no                 Following commands: Impaired Following commands impaired: Follows one step commands inconsistently, Follows one step commands with increased time      Cueing   Cueing Techniques: Tactile cues, Visual cues, Gestural cues, Verbal cues  Exercises      Shoulder Instructions       General Comments pt on RA, VSS during session    Pertinent Vitals/ Pain       Pain Assessment Pain Assessment: CPOT Facial Expression: Relaxed, neutral Body Movements: Protection Muscle Tension: Relaxed Compliance with ventilator (intubated pts.): N/A Vocalization (extubated pts.): Talking in normal tone or no sound CPOT Total: 1 Pain Intervention(s): Limited activity within patient's tolerance, Monitored during session, Repositioned  Home Living                                          Prior Functioning/Environment              Frequency  Min 2X/week        Progress Toward Goals  OT Goals(current goals can now be found in the care plan section)  Progress towards OT goals: Progressing toward goals (slowly)  Acute Rehab OT Goals Patient Stated Goal: unable OT Goal Formulation: Patient unable to participate in goal setting Time For Goal Achievement: 11/13/24 Potential to Achieve Goals: Fair  Plan      Co-evaluation                 AM-PAC OT 6 Clicks Daily Activity     Outcome Measure   Help from another person eating meals?: Total Help from another person taking care of personal grooming?: Total Help from another person toileting, which includes using toliet, bedpan, or urinal?: Total Help from another person bathing (including washing, rinsing, drying)?: Total Help from another person to put on and taking off regular upper body clothing?:  Total Help from another person to put on and taking off regular lower body clothing?: Total 6 Click Score: 6    End of Session    OT Visit Diagnosis: Muscle weakness (generalized) (M62.81);Other symptoms and signs involving cognitive function;Cognitive communication deficit (R41.841) Symptoms and signs involving cognitive functions: Cerebral infarction   Activity Tolerance Patient limited by lethargy;Patient limited by fatigue   Patient Left in bed;with call bell/phone within reach;with bed alarm set;with family/visitor present   Nurse Communication Mobility status;Precautions;Need for lift equipment;Other (comment) (pt pulling at cortrak with R UE)        Time: 8784-8758 OT Time Calculation (min): 26 min  Charges: OT General Charges $OT Visit: 1 Visit OT Treatments $Self Care/Home Management : 23-37 mins  Etta NOVAK, OT Acute Rehabilitation Services Office [phone] Secure  Chat Preferred    Etta GORMAN Hope 11/05/2024, 1:34 PM

## 2024-11-05 NOTE — TOC Progression Note (Addendum)
 Transition of Care [facility]) - Progression Note    Patient Details  Name: Erin [name] MRN: [id number] Date of Birth: 1955-12-05  Transition of Care [facility] CM/SW Contact  Shaquil Aldana, Mliss [name], RN Phone Number: 11/05/2024, 10:00am  Clinical Narrative:    Spoke with patient's husband regarding possible transition to LTAC:   Discussed available LTAC Hospitals in [place] area, [facility] and [facility].  Husband prefers [facility] due to location; he states he is more familiar with this area.  Spoke with Cierra in admissions at [facility]; she states she will start insurance authorization.  Husband is agreeable to visit from admissions liaison to ask questions, etc.   Will provide updates as available; husband is adamant that he does not want patient placed in SNF.  He states he will take her home if LTAC does not work out.    Expected Discharge Plan: Long Term Acute Care (LTAC) Barriers to Discharge: Continued Medical Work up               Expected Discharge Plan and Services   Discharge Planning Services: CM Consult Post Acute Care Choice: Long Term Acute Care (LTAC) Living arrangements for the past 2 months: Single Family Home Expected Discharge Date: 10/23/24               DME Arranged: N/A DME Agency: NA       HH Arranged: NA HH Agency: NA         Social Drivers of Health (SDOH) Interventions SDOH Screenings   Food Insecurity: No Food Insecurity (10/24/2024)  Housing: Low Risk (10/24/2024)  Transportation Needs: No Transportation Needs (10/24/2024)  Utilities: Not At Risk (10/24/2024)  Alcohol Screen: Low Risk (02/26/2024)  Depression (PHQ2-9): Low Risk (02/26/2024)  Financial Resource Strain: Low Risk (02/26/2024)  Physical Activity: Inactive (02/26/2024)  Social Connections: Socially Integrated (10/24/2024)  Stress: No Stress Concern Present (02/26/2024)  Tobacco Use: Medium Risk (10/23/2024)  Health Literacy: Adequate Health Literacy (02/26/2024)     Readmission Risk Interventions     No data to display         Mliss MICAEL Fass, RN, BSN  Trauma/Neuro ICU Case Manager [phone]

## 2024-11-05 NOTE — TOC CAGE-AID Note (Signed)
 Transition of Care [facility]) - CAGE-AID Screening   Patient Details  Name: Erin [name] MRN: [id number] Date of Birth: [birth year]  Transition of Care [facility]) CM/SW Contact:    Inocente GORMAN Kindle, LCSW Phone Number: 11/05/2024, 11:30 AM   Clinical Narrative: Patient disoriented and unable to participate.    CAGE-AID Screening: Substance Abuse Screening unable to be completed due to: : Patient unable to participate

## 2024-11-05 NOTE — Consult Note (Signed)
 Erin [name] 1956-11-09  [id number].    Requesting MD: Dr. Lue Chief Complaint/Reason for Consult: PEG tube consult  HPI: Erin [name] is a 68 y.o. 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   Patient underwent lap VHR with (Bard Ventralight 15x20cm) mesh by Dr. Rubin 12/3. This was complicated by bleeding and patient was taken back to the OR for ex lap, abdominal packing, open wound vac application by Dr. Debby on 12/4. She then underwent ex lap, removal of packing, Explantation of mesh, Closure of abdomen. Remained intubated post op. Hospital course complicated by PNA, AKI, CVA. AKI improved. Extubated 12/11. Neurology worked up CVA and has on ASA. Seen by SLP after extubation and recommended for NPO. However it appears there were no overt or subtle s/s of aspiration noted and pt was not alert enough to safely try further PO trials. SLP plans to continue to follow. RN reports patient is awake during the night and sleepy during the day. She reports plan is to start melatonin tonight and ritatlin tomorrow to help her be more awake during the day.   Trauma was asked to see for possible PEG tube placement.   She is currently HDS. On last check, WBC wnl, Hgb stable and Cr downtrending. Is not currently on abx. Is on SQH for DVT ppx and ASA for CVA but no other blood thinners. She is getting TF's via cortrak, tolerating and having BM's.   ROS: ROS As above, see hpi  Family History  Problem Relation Age of Onset   Dementia Mother    Arthritis Mother    Hyperlipidemia Mother    Heart disease Mother    Hypertension Mother    Stroke Mother 44   Irritable bowel syndrome Mother     Thyroid  disease Mother    Heart attack Mother    Heart disease Father    Hyperlipidemia Father    Hypertension Father    Stroke Father 51   Thyroid  disease Father    Prostate cancer Father    Heart attack Father    Lung cancer Brother 19   Heart attack Maternal Uncle    Heart attack Maternal Grandfather    Colon cancer Neg Hx    Rectal cancer Neg Hx    Stomach cancer Neg Hx    Esophageal cancer Neg Hx     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/14 60.4Gy/35fx and chemo   Anxiety    Aortoiliac occlusive disease (HCC) 08/14/2017   Arthritis    B12 deficiency anemia 09/14/2015   Blood transfusion without reported diagnosis    BPPV (benign paroxysmal positional vertigo) 07/08/2015   Cardiomyopathy (HCC) 11/03/2017   Chronic back pain    Chronic daily headache 03/29/2013   takes bc powder   Closed right hip fracture (HCC) 09/10/2015   Coronary artery disease involving native coronary artery of native heart without angina pectoris 10/13/2017   DES to mid RCA   Depression    Essential (hemorrhagic) thrombocythemia (HCC) 09/06/2018   Essential hypertension 09/25/2023   Family history of early CAD 04/08/2016  GERD (gastroesophageal reflux disease)    History of hiatal hernia    Hot flashes    Hyperlipidemia    Hyperlipidemia associated with type 2 diabetes mellitus (HCC) 05/11/2017   Hypothyroidism 09/10/2015   IBS (irritable bowel syndrome) 03/29/2013   Neck pain 07/08/2015   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   takes neurotin   Peripheral vascular disease 11/03/2017   QT prolongation    Sacroiliitis 09/06/2018   Spasms of the hands or feet 10/08/2017   Syncope 06/07/2016   Tubular adenoma of colon 09/08/2003   Vertigo    Wears glasses     Past Surgical History:  Procedure Laterality Date   ABDOMINAL AORTOGRAM N/A 08/02/2017   Procedure: ABDOMINAL AORTOGRAM;   Surgeon: Darron Deatrice LABOR, MD;  Location: [facility];  Service: Cardiovascular;  Laterality: N/A;   ABDOMINAL AORTOGRAM W/LOWER EXTREMITY Bilateral 09/03/2024   Procedure: ABDOMINAL AORTOGRAM W/LOWER EXTREMITY;  Surgeon: Serene Gaile ORN, MD;  Location: [facility];  Service: Cardiovascular;  Laterality: Bilateral;   AORTA - BILATERAL FEMORAL ARTERY BYPASS GRAFT N/A 08/14/2017   Procedure: AORTA BIFEMORAL BYPASS GRAFT;  Surgeon: Oris Krystal FALCON, MD;  Location: [facility] OR;  Service: Vascular;  Laterality: N/A;   COLONOSCOPY     COLONOSCOPY N/A 08/29/2024   Procedure: COLONOSCOPY;  Surgeon: Wilhelmenia Aloha Raddle., MD;  Location: [facility];  Service: Gastroenterology;  Laterality: N/A;   CORONARY STENT INTERVENTION N/A 10/13/2017   Procedure: CORONARY STENT INTERVENTION;  Surgeon: Mady Bruckner, MD;  Location: [facility];  Service: Cardiovascular;  Laterality: N/A;   dental implant     ECTOPIC PREGNANCY SURGERY     EMBOLECTOMY N/A 08/14/2017   Procedure: EMBOLECTOMY FEMORAL;  Surgeon: Oris Krystal FALCON, MD;  Location: [facility] OR;  Service: Vascular;  Laterality: N/A;   FEMORAL-POPLITEAL BYPASS GRAFT Right 08/14/2017   Procedure: Right Femoral to Above Knee Popliteal Bypass Graft using Non-Reversed Greater Saphenous Vein Graft from Right Leg;  Surgeon: Oris Krystal FALCON, MD;  Location: [facility] OR;  Service: Vascular;  Laterality: Right;   FLEXIBLE SIGMOIDOSCOPY N/A 08/14/2013   Procedure: FLEXIBLE SIGMOIDOSCOPY;  Surgeon: Gwendlyn ONEIDA Buddy, MD;  Location: [facility];  Service: Endoscopy;  Laterality: N/A;   LAPAROTOMY N/A 10/24/2024   Procedure: EXPLORATORY LAPAROTOMY, ABDOMINAL PACKING AND WOUND VAC APPLICATION;  Surgeon: Debby Hila, MD;  Location: [facility];  Service: General;  Laterality: N/A;  (8) EIGHT 18X18 LAP SPONGE PACKED   LAPAROTOMY N/A 10/25/2024   Procedure: LAPAROTOMY, EXPLORATORY;  Surgeon: Rubin Calamity, MD;  Location: [facility];  Service: General;  Laterality: N/A;  REMOVAL OF  PACKING   LEFT HEART CATH AND CORONARY ANGIOGRAPHY N/A 10/13/2017   Procedure: LEFT HEART CATH AND CORONARY ANGIOGRAPHY;  Surgeon: Mady Bruckner, MD;  [facility];  Service: Cardiovascular;  Laterality: N/A;   LOWER EXTREMITY ANGIOGRAPHY Bilateral 08/02/2017   Procedure: Lower Extremity Angiography;  Surgeon: Darron Deatrice LABOR, MD;  Location: [facility];  Service: Cardiovascular;  Laterality: Bilateral;   LOWER EXTREMITY INTERVENTION N/A 09/03/2024   Procedure: LOWER EXTREMITY INTERVENTION;  Surgeon: Serene Gaile ORN, MD;  [facility]: [facility];  Service: Cardiovascular;  Laterality: N/A;   MULTIPLE TOOTH EXTRACTIONS     PERIPHERAL VASCULAR INTERVENTION  05/22/2018   Procedure: PERIPHERAL VASCULAR INTERVENTION;  Surgeon: Serene Gaile ORN, MD;  [facility];  Service: Cardiovascular;;  SMA and Celiac   PILONIDAL CYST EXCISION     POLYPECTOMY     THROMBECTOMY FEMORAL ARTERY Right 08/14/2017  Procedure: THROMBECTOMY FEMORAL ARTERY;  Surgeon: Oris Krystal FALCON, MD;  Location: [facility] OR;  Service: Vascular;  Laterality: Right;   TONSILLECTOMY     TOTAL HIP ARTHROPLASTY  09/11/2015   Procedure: TOTAL HIP ARTHROPLASTY;  Surgeon: Evalene JONETTA Chancy, MD;  Location: [facility];  Service: Orthopedics;;   ULTRASOUND GUIDANCE FOR VASCULAR ACCESS  10/13/2017   Procedure: Ultrasound Guidance For Vascular Access;  Surgeon: Mady Bruckner, MD;  [facility]: [facility];  Service: Cardiovascular;;   VENTRAL HERNIA REPAIR N/A 10/23/2024   Procedure: REPAIR, HERNIA, VENTRAL, LAPAROSCOPIC;  Surgeon: Rubin Calamity, MD;  Location: [facility];  Service: General;  Laterality: N/A;   VISCERAL ANGIOGRAPHY N/A 03/03/2020   Procedure: MESTENRIC ANGIOGRAPHY;  Surgeon: Serene Gaile ORN, MD;  [facility];  Service: Cardiovascular;  Laterality: N/A;   VISCERAL ANGIOGRAPHY N/A 09/03/2024   Procedure: VISCERAL ANGIOGRAPHY;  Surgeon: Serene Gaile ORN, MD;  [facility]: [facility];  Service: Cardiovascular;  Laterality: N/A;   VISCERAL ARTERY INTERVENTION N/A 09/03/2024   Procedure: VISCERAL ARTERY INTERVENTION;  Surgeon: Serene Gaile ORN, MD;  [facility]: [facility];  Service: Cardiovascular;  Laterality: N/A;    Social History:  reports that she quit smoking about 10 years ago. Her smoking use included cigarettes. She smoked an average of 0.5 packs per day. She has never used smokeless tobacco. She reports that she does not drink alcohol and does not use drugs.  Allergies: Allergies[1]  Medications Prior to Admission  Medication Sig Dispense Refill   acetaminophen  (TYLENOL ) 500 MG tablet Take 1,000 mg by mouth every 6 (six) hours as needed for headache (pain).     acyclovir  ointment (ZOVIRAX ) 5 % Apply 1 Application topically every 3 (three) hours as needed (fever blisters). 15 g 3   amLODipine  (NORVASC ) 5 MG tablet Take 10 mg by mouth daily.     aspirin  EC 81 MG tablet Take 1 tablet (81 mg total) by mouth daily. Swallow whole.     carvedilol  (COREG ) 12.5 MG tablet Take 12.5 mg by mouth 2 (two) times daily.     Cholecalciferol (VITAMIN D ) 2000 units tablet Take 2,000 Units by mouth daily.     clopidogrel  (PLAVIX ) 75 MG tablet Take 1 tablet (75 mg total) by mouth daily. 30 tablet 11   DULoxetine  (CYMBALTA ) 20 MG capsule TAKE 2-3 CAPSULES BY MOUTH EVERY DAY 270 capsule 1   fluticasone  (FLONASE ) 50 MCG/ACT nasal spray SPRAY 1 SPRAY INTO EACH NOSTRIL DAILY 96 mL 1   gabapentin  (NEURONTIN ) 600 MG tablet TAKE 1 TABLET BY MOUTH THREE TIMES A DAY 90 tablet 3   ketoconazole  (NIZORAL ) 2 % shampoo Apply to hair daily for 7 days -- leave on for 5 minutes, then wash every other day for 7 days, then wash twice a week for 7 days,  then once a week. 120 mL 5   levothyroxine  (SYNTHROID ) 75 MCG tablet TAKE 1 TABLET BY MOUTH DAILY BEFORE BREAKFAST MONDAY-FRIDAY, THEN 1/2 SATURDAY AND SUNDAY 84 tablet 0   metoCLOPramide  (REGLAN ) 5 MG tablet TAKE 1 TABLET BY MOUTH  EVERY 6 HOURS AS NEEDED FOR NAUSEA. 60 tablet 0   nitroGLYCERIN  (NITROSTAT ) 0.4 MG SL tablet Place 1 tablet (0.4 mg total) under the tongue every 5 (five) minutes as needed for chest pain. 25 tablet 3   nystatin -triamcinolone  (MYCOLOG II) cream Apply 1 application topically 2 (two) times daily as needed (dry skin).   2   pantoprazole  (PROTONIX ) 40 MG tablet TAKE 1 TABLET  BY MOUTH TWICE A DAY 180 tablet 3   potassium chloride  SA (KLOR-CON  M20) 20 MEQ tablet Take 1 tablet (20 mEq total) by mouth 2 (two) times daily. 180 tablet 3   rOPINIRole  (REQUIP ) 1 MG tablet Take 1 tablet (1 mg total) by mouth at bedtime. May Increase to 2 tablet at bedtime after 7 days if needed. 180 tablet 1   rosuvastatin  (CRESTOR ) 40 MG tablet Take 1 tablet (40 mg total) by mouth daily. 90 tablet 1   tiZANidine (ZANAFLEX) 4 MG tablet Take 2 mg by mouth 3 (three) times daily.     topiramate  (TOPAMAX ) 50 MG tablet Take 50 mg by mouth 2 (two) times daily.     vitamin B-12 1000 MCG tablet Take 1 tablet (1,000 mcg total) by mouth daily. 30 tablet 0   hydrALAZINE  (APRESOLINE ) 25 MG tablet Take 1 tab once daily as needed for elevated BP (>160/95)     meclizine  (ANTIVERT ) 25 MG tablet TAKE 1 TABLET BY MOUTH THREE TIMES A DAY AS NEEDED FOR DIZZINESS 90 tablet 1     Physical Exam: Blood pressure (!) 157/75, pulse 93, temperature 98.8 F (37.1 C), temperature source Axillary, resp. rate (!) 22, height 5' 3 (1.6 m), weight 49.4 kg, SpO2 96%. General: pleasant, WD/WN female who is laying in bed in NAD HEENT: head is normocephalic, atraumatic.  Sclera are non-icteric.  Heart: regular, Lungs: Respiratory effort nonlabored Abd:  Soft, ND, NT, +BS. Midline wound with honeycomb in place, cdi. Lap incision, cdi Skin: warm and dry   Results for orders placed or performed during the hospital encounter of 10/23/24 (from the past 48 hours)  Glucose, capillary     Status: Abnormal   Collection Time: 11/03/24 11:32 AM  Result Value Ref  Range   Glucose-Capillary 103 (H) 70 - 99 mg/dL    Comment: Glucose reference range applies only to samples taken after fasting for at least 8 hours.  Glucose, capillary     Status: Abnormal   Collection Time: 11/03/24  3:40 PM  Result Value Ref Range   Glucose-Capillary 118 (H) 70 - 99 mg/dL    Comment: Glucose reference range applies only to samples taken after fasting for at least 8 hours.  Glucose, capillary     Status: Abnormal   Collection Time: 11/03/24  7:33 PM  Result Value Ref Range   Glucose-Capillary 115 (H) 70 - 99 mg/dL    Comment: Glucose reference range applies only to samples taken after fasting for at least 8 hours.  Glucose, capillary     Status: Abnormal   Collection Time: 11/03/24 11:26 PM  Result Value Ref Range   Glucose-Capillary 113 (H) 70 - 99 mg/dL    Comment: Glucose reference range applies only to samples taken after fasting for at least 8 hours.  Glucose, capillary     Status: Abnormal   Collection Time: 11/04/24  3:25 AM  Result Value Ref Range   Glucose-Capillary 109 (H) 70 - 99 mg/dL    Comment: Glucose reference range applies only to samples taken after fasting for at least 8 hours.  CBC with Differential/Platelet     Status: Abnormal   Collection Time: 11/04/24  4:36 AM  Result Value Ref Range   WBC 10.4 4.0 - 10.5 K/uL   RBC 2.90 (L) 3.87 - 5.11 MIL/uL   Hemoglobin 8.9 (L) 12.0 - 15.0 g/dL   HCT 71.8 (L) 63.9 - 53.9 %   MCV 96.9 80.0 - 100.0 fL   MCH  30.7 26.0 - 34.0 pg   MCHC 31.7 30.0 - 36.0 g/dL   RDW 84.2 (H) 88.4 - 84.4 %   Platelets 476 (H) 150 - 400 K/uL   nRBC 0.0 0.0 - 0.2 %   Neutrophils Relative % 75 %   Neutro Abs 7.8 (H) 1.7 - 7.7 K/uL   Lymphocytes Relative 9 %   Lymphs Abs 0.9 0.7 - 4.0 K/uL   Monocytes Relative 11 %   Monocytes Absolute 1.2 (H) 0.1 - 1.0 K/uL   Eosinophils Relative 1 %   Eosinophils Absolute 0.1 0.0 - 0.5 K/uL   Basophils Relative 0 %   Basophils Absolute 0.0 0.0 - 0.1 K/uL   Immature Granulocytes 4 %    Abs Immature Granulocytes 0.37 (H) 0.00 - 0.07 K/uL    Comment: Performed at [facility], [address], [place], [place] [address]  Basic metabolic panel with GFR     Status: Abnormal   Collection Time: 11/04/24  4:36 AM  Result Value Ref Range   Sodium 141 135 - 145 mmol/L   Potassium 4.3 3.5 - 5.1 mmol/L   Chloride 113 (H) 98 - 111 mmol/L   CO2 19 (L) 22 - 32 mmol/L   Glucose, Bld 99 70 - 99 mg/dL    Comment: Glucose reference range applies only to samples taken after fasting for at least 8 hours.   BUN 34 (H) 8 - 23 mg/dL   Creatinine, Ser 8.49 (H) 0.44 - 1.00 mg/dL   Calcium  8.1 (L) 8.9 - 10.3 mg/dL   GFR, Estimated 38 (L) >60 mL/min    Comment: (NOTE) Calculated using the CKD-EPI Creatinine Equation (2021)    Anion gap 9 5 - 15    Comment: Performed at [facility], [address], [place], [place] [address]  Magnesium      Status: None   Collection Time: 11/04/24  4:36 AM  Result Value Ref Range   Magnesium  1.9 1.7 - 2.4 mg/dL    Comment: Performed at [facility], [address], [place], [place] [address]  Phosphorus     Status: None   Collection Time: 11/04/24  4:36 AM  Result Value Ref Range   Phosphorus 3.5 2.5 - 4.6 mg/dL    Comment: Performed at [facility], [address] [place], [place] [address]  Glucose, capillary     Status: Abnormal   Collection Time: 11/04/24  8:12 AM  Result Value Ref Range   Glucose-Capillary 109 (H) 70 - 99 mg/dL    Comment: Glucose reference range applies only to samples taken after fasting for at least 8 hours.  Glucose, capillary     Status: Abnormal   Collection Time: 11/04/24 11:29 AM  Result Value Ref Range   Glucose-Capillary 103 (H) 70 - 99 mg/dL    Comment: Glucose reference range applies only to samples taken after fasting for at least 8 hours.  Glucose, capillary     Status: Abnormal   Collection Time: 11/04/24  3:38 PM  Result Value Ref Range   Glucose-Capillary 108 (H) 70 - 99 mg/dL     Comment: Glucose reference range applies only to samples taken after fasting for at least 8 hours.  Glucose, capillary     Status: Abnormal   Collection Time: 11/04/24  7:39 PM  Result Value Ref Range   Glucose-Capillary 113 (H) 70 - 99 mg/dL    Comment: Glucose reference range applies only to samples taken after fasting  for at least 8 hours.  Glucose, capillary     Status: Abnormal   Collection Time: 11/04/24 11:33 PM  Result Value Ref Range   Glucose-Capillary 100 (H) 70 - 99 mg/dL    Comment: Glucose reference range applies only to samples taken after fasting for at least 8 hours.  Glucose, capillary     Status: Abnormal   Collection Time: 11/05/24  3:45 AM  Result Value Ref Range   Glucose-Capillary 114 (H) 70 - 99 mg/dL    Comment: Glucose reference range applies only to samples taken after fasting for at least 8 hours.  Glucose, capillary     Status: Abnormal   Collection Time: 11/05/24  7:56 AM  Result Value Ref Range   Glucose-Capillary 122 (H) 70 - 99 mg/dL    Comment: Glucose reference range applies only to samples taken after fasting for at least 8 hours.   *Note: Due to a large number of results and/or encounters for the requested time period, some results have not been displayed. A complete set of results can be found in Results Review.   No results found.  Anti-infectives (From admission, onward)    Start     Dose/Rate Route Frequency Ordered Stop   11/01/24 1200  piperacillin -tazobactam (ZOSYN ) IVPB 3.375 g        3.375 g 12.5 mL/hr over 240 Minutes Intravenous Every 8 hours 11/01/24 0822 11/04/24 0048   10/30/24 1400  piperacillin -tazobactam (ZOSYN ) IVPB 2.25 g  Status:  Discontinued        2.25 g 100 mL/hr over 30 Minutes Intravenous Every 8 hours 10/30/24 0939 11/01/24 0822   10/28/24 1600  ceFEPIme  (MAXIPIME ) 2 g in sodium chloride  0.9 % 100 mL IVPB  Status:  Discontinued        2 g 200 mL/hr over 30 Minutes Intravenous Every 24 hours 10/28/24 1441 10/30/24  0939   10/26/24 1800  cefTRIAXone  (ROCEPHIN ) 2 g in sodium chloride  0.9 % 100 mL IVPB  Status:  Discontinued        2 g 200 mL/hr over 30 Minutes Intravenous Daily-1800 10/26/24 1739 10/28/24 1441   10/23/24 0700  ceFAZolin  (ANCEF ) IVPB 2g/100 mL premix        2 g 200 mL/hr over 30 Minutes Intravenous On call to O.R. 10/23/24 0648 10/23/24 0833       Assessment/Plan Dysphagia  POD 13 s/p lap VHR with (Bard Ventralight 15x20cm) mesh by Dr. Rubin 12/3 POD 12 s/p ex lap, abdominal packing, open wound vac application by Dr. Debby on 12/4 POD 11 s/p ex lap, removal of packing, Explantation of mesh, Closure of abdomen  CVA  Will reach out to SLP to see if they can see her again tomorrow to see if she would pass for a diet given there were no overt or subtle s/s of aspiration noted during their last visit but rather the pt was just not alert enough to safely try further PO trials and RN reports primary team is is to starting melatonin tonight and ritatlin tomorrow to help with daytime alertness. Would like to ensure she would not pass for a diet before proceeding with PEG.   If she was to not pass for a diet, I do think PEG is reasonable. I discussed with family (her husband, Folashade Gamboa) at bedside including indications and risks. Risk includes but are not limited to anesthesia (MI, CVA, Death, aspiration), bleeding, pain, infection, scarring, injury to surrounding structures and PEG tube complications (leakage, skin irritation, dislodgement  and possible need for exchanges). Discussed that PEG tube will need to stay in place for at least 6-8 weeks but can stay in place long term if needed. Discussed early removal of PEG tube can lead to gastric leak with need for repeat operation. He is agreeable with this and seems to understand the risk. We will follow with you and see what SLP updated recs are tomorrow. If she does not pass for a diet we can tentatively plan for PEG on Thursday, 12/18.   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   I reviewed nursing notes, Consultant (neuro, neprhology, ccm)  notes, hospitalist notes, last 24 h vitals and pain scores, last 48 h intake and output, last 24 h labs and trends, and last 24 h imaging results.   Ozell [name] Shaper, PA-C [facility] 11/05/2024, 11:13 AM Please see Amion for pager number during day hours 7:00am-4:30pm     [1]  Allergies Allergen Reactions   Latex Itching    Must use paper tape   Amitiza  [Lubiprostone ] Diarrhea    Uncontrollable diarrhea   Nortriptyline  Other (See Comments)    Stomach distention   Sulfa Antibiotics Nausea And Vomiting and Other (See Comments)    GI distress/pain   Tramadol  Itching and Rash   Atorvastatin  Nausea And Vomiting   Claritin [Loratadine] Other (See Comments)    Hot flashes

## 2024-11-05 NOTE — Progress Notes (Addendum)
 STROKE TEAM PROGRESS NOTE    SIGNIFICANT HOSPITAL EVENTS 12/3 - hernia repair 12/4 taken back to the OR for hemoperitoneum-active bleed was not identified, abdomen packed with wound VAC in place 12/5 back to the OR for an open abdomen-no evidence of ongoing bleeding and abdomen was closed 12/8 - CT Head and MRI brain with bil strokes 12/11-extubated  INTERIM HISTORY/SUBJECTIVE Patient is seen in her room with her husband at the bedside.  She is more awake today and is moving all 4 extremities and repositioning herself in the bed.  Husband states that she recognized a family member and displayed purposeful movement of her upper extremities, removing a swab from her mouth.  OBJECTIVE  CBC    Component Value Date/Time   WBC 10.4 11/04/2024 0436   RBC 2.90 (L) 11/04/2024 0436   HGB 8.9 (L) 11/04/2024 0436   HGB 11.9 11/08/2019 1403   HGB 10.6 (L) 11/28/2013 1148   HCT 28.1 (L) 11/04/2024 0436   HCT 35.7 11/08/2019 1403   HCT 31.2 (L) 11/28/2013 1148   PLT 476 (H) 11/04/2024 0436   PLT 250 11/08/2019 1403   MCV 96.9 11/04/2024 0436   MCV 99 (H) 11/08/2019 1403   MCV 101.4 (H) 11/28/2013 1148   MCH 30.7 11/04/2024 0436   MCHC 31.7 11/04/2024 0436   RDW 15.7 (H) 11/04/2024 0436   RDW 11.8 11/08/2019 1403   RDW 15.4 (H) 11/28/2013 1148   LYMPHSABS 0.9 11/04/2024 0436   LYMPHSABS 1.1 11/08/2019 1403   LYMPHSABS 0.8 (L) 11/28/2013 1148   MONOABS 1.2 (H) 11/04/2024 0436   MONOABS 0.9 11/28/2013 1148   EOSABS 0.1 11/04/2024 0436   EOSABS 0.0 11/08/2019 1403   BASOSABS 0.0 11/04/2024 0436   BASOSABS 0.0 11/08/2019 1403   BASOSABS 0.0 11/28/2013 1148    BMET    Component Value Date/Time   NA 141 11/04/2024 0436   NA 137 07/01/2024 1505   NA 139 04/22/2014 1028   K 4.3 11/04/2024 0436   K 4.1 04/22/2014 1028   CL 113 (H) 11/04/2024 0436   CO2 19 (L) 11/04/2024 0436   CO2 23 04/22/2014 1028   GLUCOSE 99 11/04/2024 0436   GLUCOSE 81 04/22/2014 1028   BUN 34 (H) 11/04/2024  0436   BUN 20 07/01/2024 1505   BUN 8.8 04/22/2014 1028   CREATININE 1.50 (H) 11/04/2024 0436   CREATININE 0.94 12/10/2021 1532   CREATININE 0.8 04/22/2014 1028   CALCIUM  8.1 (L) 11/04/2024 0436   CALCIUM  9.1 04/22/2014 1028   EGFR 38 (L) 07/01/2024 1505   GFRNONAA 38 (L) 11/04/2024 0436    IMAGING past 24 hours No results found.    Vitals:   11/05/24 1030 11/05/24 1100 11/05/24 1130 11/05/24 1200  BP: (!) 152/62 (!) 157/75 (!) 162/70 (!) 162/56  Pulse: 82 93 86 70  Resp: (!) 21 (!) 22 20 19   Temp:      TempSrc:      SpO2: 96% 96% 96% 97%  Weight:      Height:          Physical Exam  Constitutional: NAD  Eyes: No scleral injection.  Head: Normocephalic.  Cardiovascular: Normal rate and regular rhythm.  Respiratory: Regular, unlabored respirations on room air Skin: WDI.   Neuro : Mental Status: Patient is awake, alert, opens eyes to stimulus. Able to track people around the room.  No verbal output today, does not follow commands today Cranial Nerves: II: PERRL - 3mm brisk,  III,IV, VI: Eyes  midline, tracks to the right and left VII: right facial droop X: Cough  intact XII: Tongue midline Motor: Tone is normal. Bulk is normal.  Purposeful in all extremities  Generalized weakness Sensory: Moves to touch in all extremities  Cerebellar: Unable to perform  ASSESSMENT/PLAN  Erin [name] is a 68 y.o. female with history of coronary artery disease, hypertension, hyperlipidemia, squamous cell carcinoma of the anus-admitted to the ICU was in the hospital for management of hemorrhagic shock with concern for DIC.  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  NIH on Admission 30  Stroke:  Bilateral areas of acute infarct in the cerebrum and cerebellum  Etiology likely due to profound intracranial hypoperfusion due to severe anemia and hypovolemic shock CT head -  Interval development of multiple cortical and subcortical edema areas involving bilateral cerebral and cerebellar hemispheres. These areas are highly suggestive of embolic infarcts MRI  Numerous scattered areas of restricted diffusion throughout the cerebral hemispheres and cerebellum concerning for acute infarcts of embolic etiology. MRA  2 mm inferiorly directed outpouching along the right supraclinoid ICA, most consistent with an infundibulum at the posterior communicating artery origin. A small aneurysm is less likely but not excluded. 2D Echo EF 60-65% 12/8- EEG- ABNORMALITY - Continuous slow, generalized IMPRESSION: This study is suggestive of generalized cerebral dysfunction ( encephalopathy). No seizures or epileptiform discharges were seen throughout the recording.  LDL 13 HgbA1c 4.9 VTE prophylaxis - Heparin  subcu No antithrombotic prior to admission, now on aspirin  81 mg daily  Therapy recommendations:  LTAC Disposition: Pending  S/p hernia repair 12/3 S/p ex lap 124, 12/5 S/p mesenteric artery stenting 10/14 As needed fentanyl , oxycodone  for pain management CCS following  Hemorrhagic shock ? DIC, ruled out Hx Hypertension Home meds:  Coreg , hydralazine , norvasc -on hold Stable BP goal normotensive (less than systolic 180, MAP goal 65) Episode of hypotension post op- found to have active arterial bleed 4U PRBCs, 1U FFP, 1U Cryo, IVF Off vasopressors Avoid low BP As needed labetalol  and hydralazine  for hypertension  Respiratory Failure Was intubated for procedure Extubated 12/11 So far tolerating well  Dysphagia Patient has post-stroke dysphagia, SLP consulted N.p.o. On NG tube On tube feeding @50   Other stroke risk factors Advanced age  Other medical issues AKI, Cre 2.88--2.65--2.43--2.26--2.19--2.02--1.50, nephro on board Hypokalemia, supplement  Hospital day # 13  Patient seen and examined by NP/APP with MD. MD to update note as needed.   Erin [name] Erin [name] , MSN, AGACNP-BC [facility] See Amion for schedule and pager information 11/05/2024 12:07 PM  I have personally obtained history,examined this patient, reviewed notes, independently viewed imaging studies, participated in medical decision making and plan of care.ROS completed by me personally and pertinent positives fully documented  I have made any additions or clarifications directly to the above note. Agree with note above.  Patient continues to be encephalopathic and globally aphasic but is moving all extremities.  Continue supportive care.  Continue aspirin .  Physical occupational and speech therapy.  Long discussion with patient and husband at the bedside and answered questions.   I personally spent a total of 35 minutes in the care of the patient today including getting/reviewing separately obtained history, performing a medically appropriate exam/evaluation, counseling and educating, placing orders, referring and communicating with other health care professionals, documenting clinical information in the EHR, independently interpreting results, and coordinating care.  Erin Popp, MD Medical Director [facility] Pager: [phone] 11/05/2024 2:51 PM  To contact Stroke Continuity provider, please refer to [email or web address]. After hours, contact General Neurology

## 2024-11-06 ENCOUNTER — Inpatient Hospital Stay (HOSPITAL_COMMUNITY)

## 2024-11-06 DIAGNOSIS — D649 Anemia, unspecified: Secondary | ICD-10-CM | POA: Diagnosis not present

## 2024-11-06 DIAGNOSIS — Z9889 Other specified postprocedural states: Secondary | ICD-10-CM | POA: Diagnosis not present

## 2024-11-06 DIAGNOSIS — I6340 Cerebral infarction due to embolism of unspecified cerebral artery: Secondary | ICD-10-CM | POA: Diagnosis not present

## 2024-11-06 DIAGNOSIS — I63443 Cerebral infarction due to embolism of bilateral cerebellar arteries: Secondary | ICD-10-CM | POA: Diagnosis not present

## 2024-11-06 DIAGNOSIS — R571 Hypovolemic shock: Secondary | ICD-10-CM | POA: Diagnosis not present

## 2024-11-06 DIAGNOSIS — Z8719 Personal history of other diseases of the digestive system: Secondary | ICD-10-CM | POA: Diagnosis not present

## 2024-11-06 LAB — GLUCOSE, CAPILLARY
Glucose-Capillary: 109 mg/dL — ABNORMAL HIGH (ref 70–99)
Glucose-Capillary: 118 mg/dL — ABNORMAL HIGH (ref 70–99)
Glucose-Capillary: 121 mg/dL — ABNORMAL HIGH (ref 70–99)
Glucose-Capillary: 106 mg/dL — ABNORMAL HIGH (ref 70–99)
Glucose-Capillary: 111 mg/dL — ABNORMAL HIGH (ref 70–99)
Glucose-Capillary: 108 mg/dL — ABNORMAL HIGH (ref 70–99)

## 2024-11-06 MED ORDER — METHYLPHENIDATE HCL 5 MG PO TABS
5.0000 mg | ORAL_TABLET | Freq: Every morning | ORAL | Status: DC
  Administered 2024-11-07 – 2024-11-25 (×17): 5 mg via ORAL
  Filled 2024-11-06 (×14): qty 1

## 2024-11-06 NOTE — Progress Notes (Signed)
 PROGRESS NOTE    Erin [name]  FMW:[id number] DOB: 1956-05-28 DOA: 10/23/2024 PCP: [name] [name], MD   Brief Narrative:  68 year old woman who presented to [facility] 12/3 for elective hernia repair. 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  Patient admitted initially for postop hernia repair found to have hemoperitoneum with hemorrhagic shock postoperatively requiring intubation and pressors.  Her course was subsequently complicated by bilateral strokes thought to be provoked by hypotension.  At this time patient is stabilizing, she has been extubated, continues tube feeds via NG and is otherwise stable from a surgical standpoint.  Transition out of the ICU to hospitalist team on 12/15 with plan for ongoing care with ultimate plan for disposition pending patient's clinical course.  Hospital course 12/3 admitted to ICU postop 12/3-post hernia repair 12/4 taken back to the OR for hemoperitoneum-active bleed was not identified, abdomen packed with wound VAC in place 12/5 back to the OR for an open abdomen-no evidence of ongoing bleeding and abdomen was closed 12/8 - CT Head with interval development (since 04/2024) of multiple cortical and subcortical hypodensities in bilateral cerebellar and cerebral hemispheres suggestive of acute/subacute infarcts. MRI Brain with numerous areas of restricted diffusion throughout the cerebral hemispheres/cerebellum c/f acute infarcts of embolic etiology, associated mild edema (R occipital lobe, L temporal lobe).  12/11 tolerating PSV 5/5 >> extubated 12/15 transition to TRH team - goals of care discussion with husband(see below) 12/17 General surgery evaluating for PEG -pending SLP re-evaluation in hopes she will be able to  improve -LTAC was denied by insurance  Assessment & Plan:   Principal Problem:   S/P hernia repair Active Problems:   Shock (HCC)   Malnutrition of moderate degree  Goals of care Lengthy discussion with husband at bedside, he understands that patient has a long road ahead of her, that her NG tube will likely need to be transition to a formal PEG tube unless she has a marked improvement in n.p.o. in the next week which is unlikely. We also discussed potentially if patient were to continue to be in her current state which is essentially nonverbal and poorly interactive will transitioning to a palliative care plan be of any interest.  Patient and husband previously had worked in patient care and he is comfortable taking the patient home even if she required increased levels of care given his previous knowledge.  At this time continue full scope of care patient remains full code, plan for PEG tube placement in the next few days if p.o. intake and mental status do not improve.  SHOCK - likely hemorrhagic - Complicated by postoperative complications and hemoperitoneum - Remains off pressors  Acute bilateral cerebral and cerebellar infarcts Encephalopathy, multifactorial CT head with multiple cortical and subcortical hypodensities Neuro consulted MRI brain confirms acute infarct - likely embolic -Melatonin ordered at night, methylphenidate  ordered for the morning to encourage appropriate sleep cycle - Continue ASA 81 - PT/OT/SLP >> total assist, mental status continues to be primary barrier for patient's interaction with therapy which will dictate her dispo.  Denied by LTAC as below.  May be able to tolerate therapy at CIR but will need to show marked improvement in therapy to meet criteria - Currently requiring NG for feeds/meds - likely transition to PEG per discussion with husband above if no  improvement over the next week    HTN - Continue Amlodipine , Carvedilol  - blood pressure currently  well controlled    S/p hernia repair, elective 12/3 S/p ex-lap 12/4, 12/5 - Postoperative management per CCS - Wound care per surgery - Dysphagia -continue tube feeds via core track, speech following to advance as she improves   Acute kidney injury resolving Likely ATN due to shock/contrast History of left renal artery stenosis -Blood pressure well controlled - continue free water  per dietary via tube feeds   Acute respiratory insufficiency , extubated 12/11 HCAP -Enterobacter 12/6 - Completed therapy - weaned to room air - previously extubated as above  DVT prophylaxis: heparin  injection 5,000 Units Start: 10/28/24 2200 SCD's Start: 10/23/24 1918 Code Status:   Code Status: Full Code Family Communication: Husband at bedside  Status is: Inpt  Dispo: The patient is from: Home              Anticipated d/c is to: TBD - LTACH was declined by insurance, planning for SNF versus home with home health pending husband/family discussion              Anticipated d/c date is: TBD              Patient currently NOT medically stable for discharge  Consultants:  PCCM, General surgery, Neuro  Procedures:  Hernia repair 12/3 and ex-lap 12/5   Antimicrobials:  None(completed prior course)   Subjective: No acute issues/events overnight -review of systems limited by patient, husband indicates patient slept well overnight but is having difficulty waking today  Objective: Vitals:   11/06/24 0400 11/06/24 0500 11/06/24 0600 11/06/24 0757  BP: (!) 170/68 (!) 174/69 (!) 176/77 (!) 170/70  Pulse: 85 84 84 84  Resp: 16 20 (!) 24   Temp: 98.3 F (36.8 C)     TempSrc: Axillary     SpO2: 97% 95% 95%   Weight:  48.2 kg    Height:        Intake/Output Summary (Last 24 hours) at 11/06/2024 0758 Last data filed at 11/06/2024 0545 Gross per 24 hour  Intake 1660 ml  Output 650 ml  Net 1010 ml   Filed Weights   11/04/24 0400 11/05/24 0500 11/06/24 0500  Weight: 48.4 kg 49.4 kg 48.2 kg     Examination:  General exam: Appears calm and comfortable  Respiratory system: Clear to auscultation. Respiratory effort normal. Cardiovascular system: S1 & S2 heard, RRR. No JVD, murmurs, rubs, gallops or clicks. No pedal edema. Gastrointestinal system: Abdomen is nondistended, soft and nontender. Post operative bandage clean/dry/intact Central nervous system: Somnolent - difficult to arouse - pupils equal/reactive, physical exam limited by mental status/stroke Psychiatry: Unable to evaluate given mental status     Data Reviewed: I have personally reviewed following labs and imaging studies  CBC: Recent Labs  Lab 10/31/24 0225 11/01/24 0403 11/02/24 0237 11/03/24 0438 11/04/24 0436  WBC 14.6* 14.8* 13.4* 9.5 10.4  NEUTROABS  --   --   --   --  7.8*  HGB 9.7* 8.3* 8.9* 8.7* 8.9*  HCT 29.9* 26.2* 26.7* 27.1* 28.1*  MCV 94.0 94.2 93.0 93.8 96.9  PLT 191 252 310 414* 476*   Basic Metabolic Panel: Recent Labs  Lab 10/31/24 0225 11/01/24 0403 11/02/24 0237 11/03/24 0438 11/04/24 0436  NA 146* 141 137 138 141  K 3.9 3.0* 3.3* 3.3* 4.3  CL 107 107 106 108 113*  CO2 26 24 23  20* 19*  GLUCOSE 124* 145* 119*  113* 99  BUN 49* 46* 39* 37* 34*  CREATININE 2.19* 2.02* 1.68* 1.57* 1.50*  CALCIUM  7.6* 7.3* 7.4* 7.8* 8.1*  MG 1.9 1.5* 2.1 1.8 1.9  PHOS 2.8 2.4* 3.2 3.4 3.5   GFR: Estimated Creatinine Clearance: 27.3 mL/min (A) (by C-G formula based on SCr of 1.5 mg/dL (H)).  Liver Function Tests: No results for input(s): AST, ALT, ALKPHOS, BILITOT, PROT, ALBUMIN  in the last 168 hours.   No results for input(s): AMMONIA in the last 168 hours.  Coagulation Profile: No results for input(s): INR, PROTIME in the last 168 hours.  CBG: Recent Labs  Lab 11/05/24 1138 11/05/24 1609 11/05/24 1941 11/05/24 2341 11/06/24 0346  GLUCAP 121* 102* 110* 116* 109*    Sepsis Labs: Recent Labs  Lab 10/31/24 0225  PROCALCITON 3.88    Recent Results (from  the past 240 hours)  Culture, Respiratory w Gram Stain     Status: None   Collection Time: 10/29/24 11:46 AM   Specimen: Tracheal Aspirate; Respiratory  Result Value Ref Range Status   Specimen Description TRACHEAL ASPIRATE  Final   Special Requests NONE  Final   Gram Stain   Final    ABUNDANT WBC PRESENT, PREDOMINANTLY PMN RARE GRAM NEGATIVE RODS RARE GRAM POSITIVE COCCI IN PAIRS    Culture   Final    FEW Normal respiratory flora-no Staph aureus or Pseudomonas seen Performed at [facility], [address] [place], [place] [address]    Report Status 10/31/2024 FINAL  Final  MRSA Next Gen by PCR, Nasal     Status: None   Collection Time: 10/29/24 12:11 PM   Specimen: Nasal Mucosa; Nasal Swab  Result Value Ref Range Status   MRSA by PCR Next Gen NOT DETECTED NOT DETECTED Final    Comment: (NOTE) The GeneXpert MRSA Assay (FDA approved for NASAL specimens only), is one component of a comprehensive MRSA colonization surveillance program. It is not intended to diagnose MRSA infection nor to guide or monitor treatment for MRSA infections. Test performance is not FDA approved in patients less than 71 years old. Performed at [facility], [address] [place], [place] [address]   Gastrointestinal Panel by PCR , Stool     Status: None   Collection Time: 11/02/24  4:03 AM   Specimen: Stool  Result Value Ref Range Status   Campylobacter species NOT DETECTED NOT DETECTED Final   Plesimonas shigelloides NOT DETECTED NOT DETECTED Final   Salmonella species NOT DETECTED NOT DETECTED Final   Yersinia enterocolitica NOT DETECTED NOT DETECTED Final   Vibrio species NOT DETECTED NOT DETECTED Final   Vibrio cholerae NOT DETECTED NOT DETECTED Final   Enteroaggregative E coli (EAEC) NOT DETECTED NOT DETECTED Final   Enteropathogenic E coli (EPEC) NOT DETECTED NOT DETECTED Final   Enterotoxigenic E coli (ETEC) NOT DETECTED NOT DETECTED Final   Shiga like toxin producing E coli  (STEC) NOT DETECTED NOT DETECTED Final   Shigella/Enteroinvasive E coli (EIEC) NOT DETECTED NOT DETECTED Final   Cryptosporidium NOT DETECTED NOT DETECTED Final   Cyclospora cayetanensis NOT DETECTED NOT DETECTED Final   Entamoeba histolytica NOT DETECTED NOT DETECTED Final   Giardia lamblia NOT DETECTED NOT DETECTED Final   Adenovirus F40/41 NOT DETECTED NOT DETECTED Final   Astrovirus NOT DETECTED NOT DETECTED Final   Norovirus GI/GII NOT DETECTED NOT DETECTED Final   Rotavirus A NOT DETECTED NOT DETECTED Final   Sapovirus (I, II, IV, and V) NOT DETECTED NOT  DETECTED Final    Comment: Performed at [facility], [address] [place], [place] [address]  C Difficile Quick Screen (NO PCR Reflex)     Status: None   Collection Time: 11/02/24 12:23 PM   Specimen: STOOL  Result Value Ref Range Status   C Diff antigen NEGATIVE NEGATIVE Final   C Diff toxin NEGATIVE NEGATIVE Final   C Diff interpretation No C. difficile detected.  Final    Comment: Performed at [facility], [address], [place], [place] [address]         Radiology Studies: No results found.      Scheduled Meds:  amLODipine   10 mg Per Tube Daily   aspirin   81 mg Per Tube Daily   carvedilol   12.5 mg Per Tube BID WC   Chlorhexidine  Gluconate Cloth  6 each Topical Daily   fiber  1 packet Per Tube BID   free water   200 mL Per Tube Q8H   Gerhardt's butt cream   Topical BID   heparin  injection (subcutaneous)  5,000 Units Subcutaneous Q8H   Influenza vac split trivalent PF  0.5 mL Intramuscular Tomorrow-1000   levothyroxine   75 mcg Per Tube Q0600   melatonin  3 mg Per Tube QHS   mouth rinse  15 mL Mouth Rinse 4 times per day   pantoprazole  (PROTONIX ) IV  40 mg Intravenous QHS   Continuous Infusions:  feeding supplement (VITAL AF 1.2 CAL) 50 mL/hr at 11/06/24 0500     LOS: 14 days   Time spent:  Elsie JAYSON Montclair, DO [facility]  If 7PM-7AM, please contact  night-coverage [email or web address]  11/06/2024, 7:58 AM

## 2024-11-06 NOTE — Progress Notes (Signed)
 12 Days Post-Op   Subjective/Chief Complaint: Pt with no s/f issues Swallow eval reviewed   Objective: Vital signs in last 24 hours: Temp:  [97.6 F (36.4 C)-99.1 F (37.3 C)] 97.6 F (36.4 C) (12/17 1600) Pulse Rate:  [71-104] 80 (12/17 1600) Resp:  [16-27] 19 (12/17 1600) BP: (144-190)/(59-134) 167/66 (12/17 1600) SpO2:  [93 %-98 %] 97 % (12/17 1600) Weight:  [48.2 kg] 48.2 kg (12/17 0500) Last BM Date : 11/05/24  Intake/Output from previous day: 12/16 0701 - 12/17 0700 In: 1760 [NG/GT:1760] Out: 650 [Urine:650] Intake/Output this shift: Total I/O In: 280 [NG/GT:280] Out: -   PE:  Constitutional: No acute distress, conversant, appears states age. Eyes: Anicteric sclerae, moist conjunctiva, no lid lag Lungs: Clear to auscultation bilaterally, normal respiratory effort CV: regular rate and rhythm, no murmurs, no peripheral edema, pedal pulses 2+ GI: Soft, no masses or hepatosplenomegaly, non-tender to palpation Skin: No rashes, palpation reveals normal turgor Psychiatric: appropriate judgment and insight, oriented to person, place, and time   Lab Results:  Recent Labs    11/04/24 0436  WBC 10.4  HGB 8.9*  HCT 28.1*  PLT 476*   BMET Recent Labs    11/04/24 0436  NA 141  K 4.3  CL 113*  CO2 19*  GLUCOSE 99  BUN 34*  CREATININE 1.50*  CALCIUM  8.1*   PT/INR No results for input(s): LABPROT, INR in the last 72 hours. ABG No results for input(s): PHART, HCO3 in the last 72 hours.  Invalid input(s): PCO2, PO2  Studies/Results: DG Swallowing Func-Speech Pathology Result Date: 11/06/2024 Table formatting from the original result was not included. Modified Barium Swallow Study Patient Details Name: Erin [name] MRN: [id number] Date of Birth: 03/24/1956 Today's Date: 11/06/2024 HPI/PMH: HPI: Erin [name] is a 68 y.o. F  adm to [facility] on 12/3 for ventral hernia repair. 12/4 ex lap with abdominal hemorrhage and remained intubated post op. 12/5  abdominal closure. 12/8 MRI with multiple embolic infarcts. 12/9 transfer to [facility]. 12/11 extubated. PMHx: anal CA, hypothyroidism, GERD, CAD, cardiomyopathy, PVD, HLD, HTN, RLS. SLP consulted for cogntitive-linguistic assessment and clinical swallow assessment. Pt with a hx of mild pharyngeal dysphagia and suspected primary esophageal dysphagia per MBSS completed 05/06/22 Clinical Impression: Clinical Impression: Patient presents with severe pharyngeal dysphagia characterized by impaired effieciency and safety across all administered textures. At baseline, patient has mild pharyngeal dysphagia and suspect given current status, patient is unable to compensate for efficiency deficits resulting in airway compromise. Patient exhibits reduced hyolaryngeal excursion/elevation, epiglottic inversion (with epiglottis also abutting against posterior pharyngeal wall further preventing full inversion), pharyngeal stripping wave, and resultant closure of the laryngeal vestibule. Patient aspirates thin liquids silently during the swallow and mix of residue from thin, NTL, and HTLs after the swallow as appreciated by movement of residue material into the airway between images. Given current reduced cognitive status, patient is unable to follow commands adequately to trial any compensatory strategies. Recommend NPO at this time with medications adminsitere via alternative means. SLP will continue to follow. Factors that may increase risk of adverse event in presence of aspiration Noe & Lianne 2021): Factors that may increase risk of adverse event in presence of aspiration Noe & Lianne 2021): Weak cough; Reduced cognitive function; Poor general health and/or compromised immunity Recommendations/Plan: Swallowing Evaluation Recommendations Swallowing Evaluation Recommendations Recommendations: NPO Medication Administration: Via alternative means Oral care recommendations: Oral care QID (4x/day) Caregiver Recommendations: Have  oral suction available Treatment Plan Treatment Plan Treatment recommendations: Therapy as outlined  in treatment plan below Follow-up recommendations: Follow physicians's recommendations for discharge plan and follow up therapies Functional status assessment: Patient has had a recent decline in their functional status and demonstrates the ability to make significant improvements in function in a reasonable and predictable amount of time. Treatment frequency: Min 2x/week Treatment duration: 2 weeks Interventions: Aspiration precaution training; Oropharyngeal exercises; Compensatory techniques; Patient/family education; Trials of upgraded texture/liquids; Diet toleration management by SLP Recommendations Recommendations for follow up therapy are one component of a multi-disciplinary discharge planning process, led by the attending physician.  Recommendations may be updated based on patient status, additional functional criteria and insurance authorization. Assessment: Orofacial Exam: Orofacial Exam Oral Cavity: Oral Hygiene: WFL Oral Cavity - Dentition: Missing dentition Orofacial Anatomy: WFL Oral Motor/Sensory Function: -- (unable to assess d/t cognitive deficits) Anatomy: Anatomy: WFL Boluses Administered: Boluses Administered Boluses Administered: Thin liquids (Level 0); Mildly thick liquids (Level 2, nectar thick); Moderately thick liquids (Level 3, honey thick); Puree  Oral Impairment Domain: Oral Impairment Domain Lip Closure: No labial escape Tongue control during bolus hold: -- (unable to complete d/t impaired cognition) Bolus transport/lingual motion: Brisk tongue motion Oral residue: Trace residue lining oral structures Location of oral residue : Tongue; Palate Initiation of pharyngeal swallow : Valleculae  Pharyngeal Impairment Domain: Pharyngeal Impairment Domain Soft palate elevation: No bolus between soft palate (SP)/pharyngeal wall (PW) Laryngeal elevation: Partial superior movement of thyroid   cartilage/partial approximation of arytenoids to epiglottic petiole Anterior hyoid excursion: Partial anterior movement Epiglottic movement: Partial inversion Laryngeal vestibule closure: Incomplete, narrow column air/contrast in laryngeal vestibule Pharyngeal stripping wave : Present - diminished Pharyngeal contraction (A/P view only): N/A Pharyngoesophageal segment opening: Complete distension and complete duration, no obstruction of flow Tongue base retraction: Narrow column of contrast or air between tongue base and PPW Pharyngeal residue: Collection of residue within or on pharyngeal structures Location of pharyngeal residue: Valleculae  Esophageal Impairment Domain: No data recorded Pill: No data recorded Penetration/Aspiration Scale Score: Penetration/Aspiration Scale Score 1.  Material does not enter airway: Puree 8.  Material enters airway, passes BELOW cords without attempt by patient to eject out (silent aspiration) : Thin liquids (Level 0); Mildly thick liquids (Level 2, nectar thick); Moderately thick liquids (Level 3, honey thick) Compensatory Strategies: Compensatory Strategies Compensatory strategies: No   General Information: Caregiver present: No  Diet Prior to this Study: NPO   Temperature : Normal   Respiratory Status: WFL   Supplemental O2: None (Room air)   History of Recent Intubation: No  Behavior/Cognition: Alert; Doesn't follow directions Self-Feeding Abilities: Dependent for feeding Baseline vocal quality/speech: Hypophonia/low volume Volitional Cough: Unable to elicit Volitional Swallow: Unable to elicit Exam Limitations: Poor positioning; Limited visibility Goal Planning: Prognosis for improved oropharyngeal function: Fair Barriers to Reach Goals: Cognitive deficits; Severity of deficits No data recorded Patient/Family Stated Goal: continue to progress Consulted and agree with results and recommendations: Patient Pain: Pain Assessment Pain Assessment: No/denies pain Facial Expression: 0  Body Movements: 0 Muscle Tension: 0 Compliance with ventilator (intubated pts.): N/A Vocalization (extubated pts.): 0 CPOT Total: 0 Pain Intervention(s): Limited activity within patient's tolerance; Monitored during session; Repositioned End of Session: Start Time:SLP Start Time (ACUTE ONLY): 1159 Stop Time: SLP Stop Time (ACUTE ONLY): 1214 Time Calculation:SLP Time Calculation (min) (ACUTE ONLY): 15 min Charges: SLP Evaluations $ SLP Speech Visit: 1 Visit SLP Evaluations $MBS Swallow: 1 Procedure $Swallowing Treatment: 1 Procedure SLP visit diagnosis: SLP Visit Diagnosis: Dysphagia, pharyngeal phase (R13.13) Past Medical History: Past Medical History: Diagnosis Date  Allergy   Alopecia   Anal cancer (HCC) 08/14/2013  invasive squamous cell ca, s/p radiation 10/20-11/26/14 60.4Gy/62fx and chemo  Anxiety   Aortoiliac occlusive disease (HCC) 08/14/2017  Arthritis   B12 deficiency anemia 09/14/2015  Blood transfusion without reported diagnosis   BPPV (benign paroxysmal positional vertigo) 07/08/2015  Cardiomyopathy (HCC) 11/03/2017  Chronic back pain   Chronic daily headache 03/29/2013  takes bc powder  Closed right hip fracture (HCC) 09/10/2015  Coronary artery disease involving native coronary artery of native heart without angina pectoris 10/13/2017  DES to mid RCA  Depression   Essential (hemorrhagic) thrombocythemia (HCC) 09/06/2018  Essential hypertension 09/25/2023  Family history of early CAD 04/08/2016  GERD (gastroesophageal reflux disease)   History of hiatal hernia   Hot flashes   Hyperlipidemia   Hyperlipidemia associated with type 2 diabetes mellitus (HCC) 05/11/2017  Hypothyroidism 09/10/2015  IBS (irritable bowel syndrome) 03/29/2013  Neck pain 07/08/2015  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  takes neurotin  Peripheral vascular disease 11/03/2017  QT prolongation   Sacroiliitis 09/06/2018  Spasms of the hands or feet 10/08/2017  Syncope 06/07/2016  Tubular adenoma of colon 09/08/2003  Vertigo   Wears glasses   Past Surgical History: Past Surgical History: Procedure Laterality Date  ABDOMINAL AORTOGRAM N/A 08/02/2017  Procedure: ABDOMINAL AORTOGRAM;  Surgeon: Darron Deatrice LABOR, MD;  Location: [facility];  Service: Cardiovascular;  Laterality: N/A;  ABDOMINAL AORTOGRAM W/LOWER EXTREMITY Bilateral 09/03/2024  Procedure: ABDOMINAL AORTOGRAM W/LOWER EXTREMITY;  Surgeon: Serene Gaile ORN, MD;  Location: [facility];  Service: Cardiovascular;  Laterality: Bilateral;  AORTA - BILATERAL FEMORAL ARTERY BYPASS GRAFT N/A 08/14/2017  Procedure: AORTA BIFEMORAL BYPASS GRAFT;  Surgeon: Oris Krystal FALCON, MD;  Location: [facility] OR;  Service: Vascular;  Laterality: N/A;  COLONOSCOPY    COLONOSCOPY N/A 08/29/2024  Procedure: COLONOSCOPY;  Surgeon: Wilhelmenia Aloha Raddle., MD;  Location: [facility];  Service: Gastroenterology;  Laterality: N/A;  CORONARY STENT INTERVENTION N/A 10/13/2017  Procedure: CORONARY STENT INTERVENTION;  Surgeon: Mady Bruckner, MD;  Location: [facility];  Service: Cardiovascular;  Laterality: N/A;  dental implant    ECTOPIC PREGNANCY SURGERY    EMBOLECTOMY N/A 08/14/2017  Procedure: EMBOLECTOMY FEMORAL;  Surgeon: Oris Krystal FALCON, MD;  Location: [facility] OR;  Service: Vascular;  Laterality: N/A;  FEMORAL-POPLITEAL BYPASS GRAFT Right 08/14/2017  Procedure: Right Femoral to Above Knee Popliteal Bypass Graft using Non-Reversed Greater Saphenous Vein Graft from Right Leg;  Surgeon: Oris Krystal FALCON, MD;  Location: [facility] OR;  Service: Vascular;  Laterality: Right;  FLEXIBLE SIGMOIDOSCOPY N/A 08/14/2013  Procedure: FLEXIBLE SIGMOIDOSCOPY;  Surgeon: Gwendlyn ONEIDA Buddy, MD;  Location: [facility];  Service: Endoscopy;  Laterality: N/A;  LAPAROTOMY N/A 10/24/2024  Procedure: EXPLORATORY LAPAROTOMY, ABDOMINAL PACKING AND WOUND VAC APPLICATION;  Surgeon: Debby Hila, MD;  Location: [facility];  Service: General;  Laterality: N/A;  (8) EIGHT 18X18 LAP SPONGE PACKED  LAPAROTOMY N/A 10/25/2024  Procedure: LAPAROTOMY, EXPLORATORY;   Surgeon: Rubin Calamity, MD;  Location: [facility];  Service: General;  Laterality: N/A;  REMOVAL OF PACKING  LEFT HEART CATH AND CORONARY ANGIOGRAPHY N/A 10/13/2017  Procedure: LEFT HEART CATH AND CORONARY ANGIOGRAPHY;  Surgeon: Mady Bruckner, MD;  Location: [facility];  Service: Cardiovascular;  Laterality: N/A;  LOWER EXTREMITY ANGIOGRAPHY Bilateral 08/02/2017  Procedure: Lower Extremity Angiography;  Surgeon: Darron Deatrice LABOR, MD;  Location: [facility];  Service: Cardiovascular;  Laterality: Bilateral;  LOWER EXTREMITY INTERVENTION N/A 09/03/2024  Procedure: LOWER EXTREMITY INTERVENTION;  Surgeon: Serene Gaile ORN,  MD;  [facility]: [facility];  Service: Cardiovascular;  Laterality: N/A;  MULTIPLE TOOTH EXTRACTIONS    PERIPHERAL VASCULAR INTERVENTION  05/22/2018  Procedure: PERIPHERAL VASCULAR INTERVENTION;  Surgeon: Serene Gaile ORN, MD;  [facility]: [facility];  Service: Cardiovascular;;  SMA and Celiac  PILONIDAL CYST EXCISION    POLYPECTOMY    THROMBECTOMY FEMORAL ARTERY Right 08/14/2017  Procedure: THROMBECTOMY FEMORAL ARTERY;  Surgeon: Oris Krystal FALCON, MD;  Location: [facility] OR;  Service: Vascular;  Laterality: Right;  TONSILLECTOMY    TOTAL HIP ARTHROPLASTY  09/11/2015  Procedure: TOTAL HIP ARTHROPLASTY;  Surgeon: Evalene JONETTA Chancy, MD;  Location: [facility];  Service: Orthopedics;;  ULTRASOUND GUIDANCE FOR VASCULAR ACCESS  10/13/2017  Procedure: Ultrasound Guidance For Vascular Access;  Surgeon: Mady Bruckner, MD;  Location: [facility];  Service: Cardiovascular;;  VENTRAL HERNIA REPAIR N/A 10/23/2024  Procedure: REPAIR, HERNIA, VENTRAL, LAPAROSCOPIC;  Surgeon: Rubin Calamity, MD;  Location: [facility];  Service: General;  Laterality: N/A;  VISCERAL ANGIOGRAPHY N/A 03/03/2020  Procedure: MESTENRIC ANGIOGRAPHY;  Surgeon: Serene Gaile ORN, MD;  Location: [facility];  Service: Cardiovascular;  Laterality: N/A;  VISCERAL ANGIOGRAPHY N/A 09/03/2024  Procedure: VISCERAL ANGIOGRAPHY;   Surgeon: Serene Gaile ORN, MD;  Location: [facility];  Service: Cardiovascular;  Laterality: N/A;  VISCERAL ARTERY INTERVENTION N/A 09/03/2024  Procedure: VISCERAL ARTERY INTERVENTION;  Surgeon: Serene Gaile ORN, MD;  Location: [facility];  Service: Cardiovascular;  Laterality: N/A; Rosina DELENA Downy 11/06/2024, 2:59 PM   Anti-infectives: Anti-infectives (From admission, onward)    Start     Dose/Rate Route Frequency Ordered Stop   11/01/24 1200  piperacillin -tazobactam (ZOSYN ) IVPB 3.375 g        3.375 g 12.5 mL/hr over 240 Minutes Intravenous Every 8 hours 11/01/24 0822 11/04/24 0048   10/30/24 1400  piperacillin -tazobactam (ZOSYN ) IVPB 2.25 g  Status:  Discontinued        2.25 g 100 mL/hr over 30 Minutes Intravenous Every 8 hours 10/30/24 0939 11/01/24 0822   10/28/24 1600  ceFEPIme  (MAXIPIME ) 2 g in sodium chloride  0.9 % 100 mL IVPB  Status:  Discontinued        2 g 200 mL/hr over 30 Minutes Intravenous Every 24 hours 10/28/24 1441 10/30/24 0939   10/26/24 1800  cefTRIAXone  (ROCEPHIN ) 2 g in sodium chloride  0.9 % 100 mL IVPB  Status:  Discontinued        2 g 200 mL/hr over 30 Minutes Intravenous Daily-1800 10/26/24 1739 10/28/24 1441   10/23/24 0700  ceFAZolin  (ANCEF ) IVPB 2g/100 mL premix        2 g 200 mL/hr over 30 Minutes Intravenous On call to O.R. 10/23/24 9351 10/23/24 9166       Assessment/Plan: s/p POD 12 from lap VHR and reexploration for bleeding, POD 10 from reexploration and abdominal closure -con't Tf -dispo planning in process -likely will need for PEG   LOS: 14 days    Calamity Rubin 11/06/2024

## 2024-11-06 NOTE — Progress Notes (Signed)
 Noted MBSS results. Recommend phagenyx with history of stroke prior to planning for PEG. Continue dispo planning.  Erin GEANNIE Hanger, MD General and Trauma Surgery [facility]

## 2024-11-06 NOTE — Plan of Care (Signed)
°  Problem: Activity: Goal: Risk for activity intolerance will decrease Outcome: Progressing   Problem: Nutrition: Goal: Adequate nutrition will be maintained Outcome: Progressing   Problem: Elimination: Goal: Will not experience complications related to bowel motility Outcome: Progressing   Problem: Pain Managment: Goal: General experience of comfort will improve and/or be controlled Outcome: Progressing   Problem: Skin Integrity: Goal: Risk for impaired skin integrity will decrease Outcome: Progressing   Problem: Activity: Goal: Ability to tolerate increased activity will improve Outcome: Progressing   Problem: Education: Goal: Knowledge of General Education information will improve Description: Including pain rating scale, medication(s)/side effects and non-pharmacologic comfort measures Outcome: Not Progressing
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
## 2024-11-06 NOTE — Progress Notes (Signed)
 Speech Language Pathology Treatment: Dysphagia  Patient Details Name: Erin [name] MRN: [id number] DOB: [birth year] Today's Date: 11/06/2024 Time: [id number] SLP Time Calculation (min) (ACUTE ONLY): 23 min  Assessment / Plan / Recommendation Clinical Impression  SLP conducted skilled therapy session targeting swallowing goals. Upon SLP entry, husband present at bedside and reports patient is lethargic/somnolent, medical team has not yet initiated planned ritalin  regimen. SLP repositioned patient upright then facilitated oral care via suction toothbrush. Patient requires total assist for oral care but overall cooperative. She benefits from max cues for bolus acceptance, though once accepted, she completed oral preparation and initiates swallow spontaneously. Patient demonstrated no overt s/sx of penetration/aspiration with purees but demonstrated immediate cough x1 with thin liquids. Given hx of baseline dysphagia, recommend MBS to objectively assess oropharyngeal swallowing function prior to diet initiation. Planned for 12 PM. Husband agreeable to plan for swallow study. Patient was left in room with call bell in reach and alarm set. SLP will continue to target goals per plan of care.     HPI HPI: Erin [name] is a 68 y.o. F  adm to [facility] on 12/3 for ventral hernia repair. 12/4 ex lap with abdominal hemorrhage and remained intubated post op. 12/5 abdominal closure. 12/8 MRI with multiple embolic infarcts. 12/9 transfer to [facility]. 12/11 extubated. PMHx: anal CA, hypothyroidism, GERD, CAD, cardiomyopathy, PVD, HLD, HTN, RLS. SLP consulted for cogntitive-linguistic assessment and clinical swallow assessment. Pt with a hx of mild pharyngeal dysphagia and suspected primary esophageal dysphagia per MBSS completed 05/06/22      SLP Plan  MBS        Swallow Evaluation Recommendations   Recommendations: NPO Medication Administration: Via alternative means Oral care recommendations: Oral care QID  (4x/day) Caregiver Recommendations: Have oral suction available     Recommendations                     Oral care QID;Oral care prior to ice chip/H20   Frequent or constant Supervision/Assistance Dysphagia, unspecified (R13.10);Cognitive communication deficit (R41.841);Aphasia (R47.01)     MBS     Rosina LABOR Ritaj Dullea  11/06/2024, 2:34 PM

## 2024-11-06 NOTE — Progress Notes (Signed)
 STROKE TEAM PROGRESS NOTE    SIGNIFICANT HOSPITAL EVENTS 12/3 - hernia repair 12/4 taken back to the OR for hemoperitoneum-active bleed was not identified, abdomen packed with wound VAC in place 12/5 back to the OR for an open abdomen-no evidence of ongoing bleeding and abdomen was closed 12/8 - CT Head and MRI brain with bil strokes 12/11-extubated  INTERIM HISTORY/SUBJECTIVE Patient is seen in her room with her husband at the bedside.  She is more awake today and is moving all 4 extremities and trying to speak but speech is incomprehensible.  He is making mostly guttural sounds.  He is not consistently following commands.  OBJECTIVE  CBC    Component Value Date/Time   WBC 10.4 11/04/2024 0436   RBC 2.90 (L) 11/04/2024 0436   HGB 8.9 (L) 11/04/2024 0436   HGB 11.9 11/08/2019 1403   HGB 10.6 (L) 11/28/2013 1148   HCT 28.1 (L) 11/04/2024 0436   HCT 35.7 11/08/2019 1403   HCT 31.2 (L) 11/28/2013 1148   PLT 476 (H) 11/04/2024 0436   PLT 250 11/08/2019 1403   MCV 96.9 11/04/2024 0436   MCV 99 (H) 11/08/2019 1403   MCV 101.4 (H) 11/28/2013 1148   MCH 30.7 11/04/2024 0436   MCHC 31.7 11/04/2024 0436   RDW 15.7 (H) 11/04/2024 0436   RDW 11.8 11/08/2019 1403   RDW 15.4 (H) 11/28/2013 1148   LYMPHSABS 0.9 11/04/2024 0436   LYMPHSABS 1.1 11/08/2019 1403   LYMPHSABS 0.8 (L) 11/28/2013 1148   MONOABS 1.2 (H) 11/04/2024 0436   MONOABS 0.9 11/28/2013 1148   EOSABS 0.1 11/04/2024 0436   EOSABS 0.0 11/08/2019 1403   BASOSABS 0.0 11/04/2024 0436   BASOSABS 0.0 11/08/2019 1403   BASOSABS 0.0 11/28/2013 1148    BMET    Component Value Date/Time   NA 141 11/04/2024 0436   NA 137 07/01/2024 1505   NA 139 04/22/2014 1028   K 4.3 11/04/2024 0436   K 4.1 04/22/2014 1028   CL 113 (H) 11/04/2024 0436   CO2 19 (L) 11/04/2024 0436   CO2 23 04/22/2014 1028   GLUCOSE 99 11/04/2024 0436   GLUCOSE 81 04/22/2014 1028   BUN 34 (H) 11/04/2024 0436   BUN 20 07/01/2024 1505   BUN 8.8  04/22/2014 1028   CREATININE 1.50 (H) 11/04/2024 0436   CREATININE 0.94 12/10/2021 1532   CREATININE 0.8 04/22/2014 1028   CALCIUM  8.1 (L) 11/04/2024 0436   CALCIUM  9.1 04/22/2014 1028   EGFR 38 (L) 07/01/2024 1505   GFRNONAA 38 (L) 11/04/2024 0436    IMAGING past 24 hours DG Swallowing Func-Speech Pathology Result Date: 11/06/2024 Table formatting from the original result was not included. Modified Barium Swallow Study Patient Details Name: GREGORY DOWE MRN: [id number] Date of Birth: August 29, 1956 Today's Date: 11/06/2024 HPI/PMH: HPI: Mrs. Iams is a 68 y.o. F  adm to [facility] on 12/3 for ventral hernia repair. 12/4 ex lap with abdominal hemorrhage and remained intubated post op. 12/5 abdominal closure. 12/8 MRI with multiple embolic infarcts. 12/9 transfer to [facility]. 12/11 extubated. PMHx: anal CA, hypothyroidism, GERD, CAD, cardiomyopathy, PVD, HLD, HTN, RLS. SLP consulted for cogntitive-linguistic assessment and clinical swallow assessment. Pt with a hx of mild pharyngeal dysphagia and suspected primary esophageal dysphagia per MBSS completed 05/06/22 Clinical Impression: Clinical Impression: Patient presents with severe pharyngeal dysphagia characterized by impaired effieciency and safety across all administered textures. At baseline, patient has mild pharyngeal dysphagia and suspect given current status, patient is unable to compensate for  efficiency deficits resulting in airway compromise. Patient exhibits reduced hyolaryngeal excursion/elevation, epiglottic inversion (with epiglottis also abutting against posterior pharyngeal wall further preventing full inversion), pharyngeal stripping wave, and resultant closure of the laryngeal vestibule. Patient aspirates thin liquids silently during the swallow and mix of residue from thin, NTL, and HTLs after the swallow as appreciated by movement of residue material into the airway between images. Given current reduced cognitive status, patient is unable to  follow commands adequately to trial any compensatory strategies. Recommend NPO at this time with medications adminsitere via alternative means. SLP will continue to follow. Factors that may increase risk of adverse event in presence of aspiration Noe & Lianne 2021): Factors that may increase risk of adverse event in presence of aspiration Noe & Lianne 2021): Weak cough; Reduced cognitive function; Poor general health and/or compromised immunity Recommendations/Plan: Swallowing Evaluation Recommendations Swallowing Evaluation Recommendations Recommendations: NPO Medication Administration: Via alternative means Oral care recommendations: Oral care QID (4x/day) Caregiver Recommendations: Have oral suction available Treatment Plan Treatment Plan Treatment recommendations: Therapy as outlined in treatment plan below Follow-up recommendations: Follow physicians's recommendations for discharge plan and follow up therapies Functional status assessment: Patient has had a recent decline in their functional status and demonstrates the ability to make significant improvements in function in a reasonable and predictable amount of time. Treatment frequency: Min 2x/week Treatment duration: 2 weeks Interventions: Aspiration precaution training; Oropharyngeal exercises; Compensatory techniques; Patient/family education; Trials of upgraded texture/liquids; Diet toleration management by SLP Recommendations Recommendations for follow up therapy are one component of a multi-disciplinary discharge planning process, led by the attending physician.  Recommendations may be updated based on patient status, additional functional criteria and insurance authorization. Assessment: Orofacial Exam: Orofacial Exam Oral Cavity: Oral Hygiene: WFL Oral Cavity - Dentition: Missing dentition Orofacial Anatomy: WFL Oral Motor/Sensory Function: -- (unable to assess d/t cognitive deficits) Anatomy: Anatomy: WFL Boluses Administered: Boluses  Administered Boluses Administered: Thin liquids (Level 0); Mildly thick liquids (Level 2, nectar thick); Moderately thick liquids (Level 3, honey thick); Puree  Oral Impairment Domain: Oral Impairment Domain Lip Closure: No labial escape Tongue control during bolus hold: -- (unable to complete d/t impaired cognition) Bolus transport/lingual motion: Brisk tongue motion Oral residue: Trace residue lining oral structures Location of oral residue : Tongue; Palate Initiation of pharyngeal swallow : Valleculae  Pharyngeal Impairment Domain: Pharyngeal Impairment Domain Soft palate elevation: No bolus between soft palate (SP)/pharyngeal wall (PW) Laryngeal elevation: Partial superior movement of thyroid  cartilage/partial approximation of arytenoids to epiglottic petiole Anterior hyoid excursion: Partial anterior movement Epiglottic movement: Partial inversion Laryngeal vestibule closure: Incomplete, narrow column air/contrast in laryngeal vestibule Pharyngeal stripping wave : Present - diminished Pharyngeal contraction (A/P view only): N/A Pharyngoesophageal segment opening: Complete distension and complete duration, no obstruction of flow Tongue base retraction: Narrow column of contrast or air between tongue base and PPW Pharyngeal residue: Collection of residue within or on pharyngeal structures Location of pharyngeal residue: Valleculae  Esophageal Impairment Domain: No data recorded Pill: No data recorded Penetration/Aspiration Scale Score: Penetration/Aspiration Scale Score 1.  Material does not enter airway: Puree 8.  Material enters airway, passes BELOW cords without attempt by patient to eject out (silent aspiration) : Thin liquids (Level 0); Mildly thick liquids (Level 2, nectar thick); Moderately thick liquids (Level 3, honey thick) Compensatory Strategies: Compensatory Strategies Compensatory strategies: No   General Information: Caregiver present: No  Diet Prior to this Study: NPO   Temperature : Normal    Respiratory Status: WFL   Supplemental O2:  None (Room air)   History of Recent Intubation: No  Behavior/Cognition: Alert; Doesn't follow directions Self-Feeding Abilities: Dependent for feeding Baseline vocal quality/speech: Hypophonia/low volume Volitional Cough: Unable to elicit Volitional Swallow: Unable to elicit Exam Limitations: Poor positioning; Limited visibility Goal Planning: Prognosis for improved oropharyngeal function: Fair Barriers to Reach Goals: Cognitive deficits; Severity of deficits No data recorded Patient/Family Stated Goal: continue to progress Consulted and agree with results and recommendations: Patient Pain: Pain Assessment Pain Assessment: No/denies pain Facial Expression: 0 Body Movements: 0 Muscle Tension: 0 Compliance with ventilator (intubated pts.): N/A Vocalization (extubated pts.): 0 CPOT Total: 0 Pain Intervention(s): Limited activity within patient's tolerance; Monitored during session; Repositioned End of Session: Start Time:SLP Start Time (ACUTE ONLY): 1159 Stop Time: SLP Stop Time (ACUTE ONLY): 1214 Time Calculation:SLP Time Calculation (min) (ACUTE ONLY): 15 min Charges: SLP Evaluations $ SLP Speech Visit: 1 Visit SLP Evaluations $MBS Swallow: 1 Procedure $Swallowing Treatment: 1 Procedure SLP visit diagnosis: SLP Visit Diagnosis: Dysphagia, pharyngeal phase (R13.13) Past Medical History: Past Medical History: Diagnosis Date  Allergy   Alopecia   Anal cancer (HCC) 08/14/2013  invasive squamous cell ca, s/p radiation 10/20-11/26/14 60.4Gy/64fx and chemo  Anxiety   Aortoiliac occlusive disease (HCC) 08/14/2017  Arthritis   B12 deficiency anemia 09/14/2015  Blood transfusion without reported diagnosis   BPPV (benign paroxysmal positional vertigo) 07/08/2015  Cardiomyopathy (HCC) 11/03/2017  Chronic back pain   Chronic daily headache 03/29/2013  takes bc powder  Closed right hip fracture (HCC) 09/10/2015  Coronary artery disease involving native coronary artery of native heart  without angina pectoris 10/13/2017  DES to mid RCA  Depression   Essential (hemorrhagic) thrombocythemia (HCC) 09/06/2018  Essential hypertension 09/25/2023  Family history of early CAD 04/08/2016  GERD (gastroesophageal reflux disease)   History of hiatal hernia   Hot flashes   Hyperlipidemia   Hyperlipidemia associated with type 2 diabetes mellitus (HCC) 05/11/2017  Hypothyroidism 09/10/2015  IBS (irritable bowel syndrome) 03/29/2013  Neck pain 07/08/2015  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  takes neurotin  Peripheral vascular disease 11/03/2017  QT prolongation   Sacroiliitis 09/06/2018  Spasms of the hands or feet 10/08/2017  Syncope 06/07/2016  Tubular adenoma of colon 09/08/2003  Vertigo   Wears glasses  Past Surgical History: Past Surgical History: Procedure Laterality Date  ABDOMINAL AORTOGRAM N/A 08/02/2017  Procedure: ABDOMINAL AORTOGRAM;  Surgeon: Darron Deatrice LABOR, MD;  Location: [facility];  Service: Cardiovascular;  Laterality: N/A;  ABDOMINAL AORTOGRAM W/LOWER EXTREMITY Bilateral 09/03/2024  Procedure: ABDOMINAL AORTOGRAM W/LOWER EXTREMITY;  Surgeon: Serene Gaile ORN, MD;  Location: [facility];  Service: Cardiovascular;  Laterality: Bilateral;  AORTA - BILATERAL FEMORAL ARTERY BYPASS GRAFT N/A 08/14/2017  Procedure: AORTA BIFEMORAL BYPASS GRAFT;  Surgeon: Oris Krystal FALCON, MD;  Location: [facility] OR;  Service: Vascular;  Laterality: N/A;  COLONOSCOPY    COLONOSCOPY N/A 08/29/2024  Procedure: COLONOSCOPY;  Surgeon: Wilhelmenia Aloha Raddle., MD;  Location: [facility];  Service: Gastroenterology;  Laterality: N/A;  CORONARY STENT INTERVENTION N/A 10/13/2017  Procedure: CORONARY STENT INTERVENTION;  Surgeon: Mady Bruckner, MD;  Location: [facility];  Service: Cardiovascular;  Laterality: N/A;  dental implant    ECTOPIC PREGNANCY SURGERY    EMBOLECTOMY N/A 08/14/2017  Procedure: EMBOLECTOMY FEMORAL;  Surgeon: Oris Krystal FALCON, MD;  Location: [facility] OR;  Service: Vascular;  Laterality: N/A;   FEMORAL-POPLITEAL BYPASS GRAFT Right 08/14/2017  Procedure: Right Femoral to Above Knee Popliteal Bypass Graft using Non-Reversed Greater Saphenous Vein Graft from Right Leg;  Surgeon:  Oris Krystal FALCON, MD;  Location: [facility] OR;  Service: Vascular;  Laterality: Right;  FLEXIBLE SIGMOIDOSCOPY N/A 08/14/2013  Procedure: FLEXIBLE SIGMOIDOSCOPY;  Surgeon: Gwendlyn ONEIDA Buddy, MD;  Location: [facility];  Service: Endoscopy;  Laterality: N/A;  LAPAROTOMY N/A 10/24/2024  Procedure: EXPLORATORY LAPAROTOMY, ABDOMINAL PACKING AND WOUND VAC APPLICATION;  Surgeon: Debby Hila, MD;  Location: [facility];  Service: General;  Laterality: N/A;  (8) EIGHT 18X18 LAP SPONGE PACKED  LAPAROTOMY N/A 10/25/2024  Procedure: LAPAROTOMY, EXPLORATORY;  Surgeon: Rubin Calamity, MD;  Location: [facility];  Service: General;  Laterality: N/A;  REMOVAL OF PACKING  LEFT HEART CATH AND CORONARY ANGIOGRAPHY N/A 10/13/2017  Procedure: LEFT HEART CATH AND CORONARY ANGIOGRAPHY;  Surgeon: Mady Bruckner, MD;  Location: [facility];  Service: Cardiovascular;  Laterality: N/A;  LOWER EXTREMITY ANGIOGRAPHY Bilateral 08/02/2017  Procedure: Lower Extremity Angiography;  Surgeon: Darron Deatrice LABOR, MD;  Location: [facility] INVASIVE CV LAB;  Service: Cardiovascular;  Laterality: Bilateral;  LOWER EXTREMITY INTERVENTION N/A 09/03/2024  Procedure: LOWER EXTREMITY INTERVENTION;  Surgeon: Serene Gaile ORN, MD;  Location: [facility];  Service: Cardiovascular;  Laterality: N/A;  MULTIPLE TOOTH EXTRACTIONS    PERIPHERAL VASCULAR INTERVENTION  05/22/2018  Procedure: PERIPHERAL VASCULAR INTERVENTION;  Surgeon: Serene Gaile ORN, MD;  Location: [facility];  Service: Cardiovascular;;  SMA and Celiac  PILONIDAL CYST EXCISION    POLYPECTOMY    THROMBECTOMY FEMORAL ARTERY Right 08/14/2017  Procedure: THROMBECTOMY FEMORAL ARTERY;  Surgeon: Oris Krystal FALCON, MD;  Location: [facility] OR;  Service: Vascular;  Laterality: Right;  TONSILLECTOMY    TOTAL HIP ARTHROPLASTY  09/11/2015   Procedure: TOTAL HIP ARTHROPLASTY;  Surgeon: Evalene JONETTA Chancy, MD;  Location: [facility];  Service: Orthopedics;;  ULTRASOUND GUIDANCE FOR VASCULAR ACCESS  10/13/2017  Procedure: Ultrasound Guidance For Vascular Access;  Surgeon: Mady Bruckner, MD;  Location: [facility];  Service: Cardiovascular;;  VENTRAL HERNIA REPAIR N/A 10/23/2024  Procedure: REPAIR, HERNIA, VENTRAL, LAPAROSCOPIC;  Surgeon: Rubin Calamity, MD;  Location: [facility];  Service: General;  Laterality: N/A;  VISCERAL ANGIOGRAPHY N/A 03/03/2020  Procedure: MESTENRIC ANGIOGRAPHY;  Surgeon: Serene Gaile ORN, MD;  Location: [facility];  Service: Cardiovascular;  Laterality: N/A;  VISCERAL ANGIOGRAPHY N/A 09/03/2024  Procedure: VISCERAL ANGIOGRAPHY;  Surgeon: Serene Gaile ORN, MD;  Location: [facility];  Service: Cardiovascular;  Laterality: N/A;  VISCERAL ARTERY INTERVENTION N/A 09/03/2024  Procedure: VISCERAL ARTERY INTERVENTION;  Surgeon: Serene Gaile ORN, MD;  Location: [facility];  Service: Cardiovascular;  Laterality: N/AMERL Rosina LABOR Ellin 11/06/2024, 2:59 PM     Vitals:   11/06/24 1200 11/06/24 1300 11/06/24 1330 11/06/24 1459  BP: (!) 158/68 (!) 167/77 (!) 170/71 (!) 181/71  Pulse:   94 83  Resp: (!) 24 18 (!) 22 19  Temp:    97.7 F (36.5 C)  TempSrc:    Axillary  SpO2:   94% 98%  Weight:      Height:          Physical Exam  Constitutional: NAD  Eyes: No scleral injection.  Head: Normocephalic.  Cardiovascular: Normal rate and regular rhythm.  Respiratory: Regular, unlabored respirations on room air Skin: WDI.   Neuro : Mental Status: Patient is awake, alert, opens eyes to stimulus. Able to track people around the room.  No verbal output today, does not follow commands today Cranial Nerves: II: PERRL - 3mm brisk,  III,IV, VI: Eyes midline, tracks to the right and left VII: right facial  droop X: Cough  intact XII: Tongue midline Motor: Tone is normal. Bulk is normal.  Purposeful in all  extremities  Generalized weakness Sensory: Moves to touch in all extremities  Cerebellar: Unable to perform  ASSESSMENT/PLAN  Ms. KONSTANCE HAPPEL is a 68 y.o. female with history of coronary artery disease, hypertension, hyperlipidemia, squamous cell carcinoma of the anus-admitted to the ICU was in the hospital for management of hemorrhagic shock with concern for DIC.  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 These areas are highly suggestive of embolic infarcts MRI  Numerous scattered areas of restricted diffusion throughout the cerebral hemispheres and cerebellum concerning for acute infarcts of embolic etiology. MRA  2 mm inferiorly directed outpouching along the right supraclinoid ICA, most consistent with an infundibulum at the posterior communicating artery origin. A small aneurysm is less likely but not excluded. 2D Echo EF 60-65% 12/8- EEG- ABNORMALITY - Continuous slow, generalized IMPRESSION: This study is suggestive of generalized cerebral dysfunction ( encephalopathy). No seizures or epileptiform discharges were seen throughout the recording.  LDL 13 HgbA1c 4.9 VTE prophylaxis - Heparin  subcu No antithrombotic prior to admission, now on aspirin  81 mg daily  Therapy recommendations:  LTAC Disposition: Pending  S/p hernia repair 12/3 S/p ex lap 124, 12/5 S/p mesenteric artery stenting 10/14 As needed fentanyl , oxycodone  for pain management CCS following  Hemorrhagic shock ? DIC, ruled out Hx Hypertension Home meds:  Coreg , hydralazine , norvasc -on  hold Stable BP goal normotensive (less than systolic 180, MAP goal 65) Episode of hypotension post op- found to have active arterial bleed 4U PRBCs, 1U FFP, 1U Cryo, IVF Off vasopressors Avoid low BP As needed labetalol  and hydralazine  for hypertension  Respiratory Failure Was intubated for procedure Extubated 12/11 So far tolerating well  Dysphagia Patient has post-stroke dysphagia, SLP consulted N.p.o. On NG tube On tube feeding @50   Other stroke risk factors Advanced age  Other medical issues AKI, Cre 2.88--2.65--2.43--2.26--2.19--2.02--1.50, nephro on board Hypokalemia, supplement  Hospital day # 14   Patient continues to be encephalopathic and globally aphasic but is moving all extremities.  Continue supportive care.  Continue aspirin .  Physical occupational and speech therapy.  Patient will likely need prolonged rehabilitation and LTAC like situation if she qualifies.  Long discussion with patient and husband at the bedside and answered questions.  Discussed with Dr. Lue.  Stroke team will sign off.         Eather Popp, MD Medical Director [facility] Pager: [phone] 11/06/2024 3:46 PM  To contact Stroke Continuity provider, please refer to [email or web address]. After hours, contact General Neurology

## 2024-11-06 NOTE — Evaluation (Signed)
 Modified Barium Swallow Study  Patient Details  Name: Erin [name] MRN: [id number] Date of Birth: [birth year]  Today's Date: 11/06/2024  Modified Barium Swallow completed.  Full report located under Chart Review in the Imaging Section.  History of Present Illness Erin [name] is a 68 y.o. F  adm to [facility] on 12/3 for ventral hernia repair. 12/4 ex lap with abdominal hemorrhage and remained intubated post op. 12/5 abdominal closure. 12/8 MRI with multiple embolic infarcts. 12/9 transfer to [facility]. 12/11 extubated. PMHx: anal CA, hypothyroidism, GERD, CAD, cardiomyopathy, PVD, HLD, HTN, RLS. SLP consulted for cogntitive-linguistic assessment and clinical swallow assessment. Pt with a hx of mild pharyngeal dysphagia and suspected primary esophageal dysphagia per MBSS completed 05/06/22   Clinical Impression Patient presents with severe pharyngeal dysphagia characterized by impaired effieciency and safety across all administered textures. At baseline, patient has mild pharyngeal dysphagia and suspect given current status, patient is unable to compensate for efficiency deficits resulting in airway compromise. Patient exhibits reduced hyolaryngeal excursion/elevation, epiglottic inversion (with epiglottis also abutting against posterior pharyngeal wall further preventing full inversion), pharyngeal stripping wave, and resultant closure of the laryngeal vestibule. Patient aspirates thin liquids silently during the swallow and mix of residue from thin, NTL, and HTLs after the swallow as appreciated by movement of residue material into the airway between images. Given current reduced cognitive status, patient is unable to follow commands adequately to trial any compensatory strategies. Recommend NPO at this time with medications adminsitere via alternative means. SLP will continue to follow.  Factors that may increase risk of adverse event in presence of aspiration Noe & Lianne 2021): Weak cough;Reduced  cognitive function;Poor general health and/or compromised immunity  Swallow Evaluation Recommendations Recommendations: NPO Medication Administration: Via alternative means Oral care recommendations: Oral care QID (4x/day) Caregiver Recommendations: Have oral suction available   Erin [name], M.A., CCC-SLP    Erin [name] 11/06/2024,2:58 PM

## 2024-11-07 ENCOUNTER — Inpatient Hospital Stay (HOSPITAL_COMMUNITY)

## 2024-11-07 DIAGNOSIS — Z9889 Other specified postprocedural states: Secondary | ICD-10-CM | POA: Diagnosis not present

## 2024-11-07 DIAGNOSIS — Z8719 Personal history of other diseases of the digestive system: Secondary | ICD-10-CM | POA: Diagnosis not present

## 2024-11-07 LAB — COMPREHENSIVE METABOLIC PANEL WITH GFR
ALT: 12 U/L (ref 0–44)
AST: 19 U/L (ref 15–41)
Albumin: 3.2 g/dL — ABNORMAL LOW (ref 3.5–5.0)
Alkaline Phosphatase: 89 U/L (ref 38–126)
Anion gap: 10 (ref 5–15)
BUN: 36 mg/dL — ABNORMAL HIGH (ref 8–23)
CO2: 20 mmol/L — ABNORMAL LOW (ref 22–32)
Calcium: 8.8 mg/dL — ABNORMAL LOW (ref 8.9–10.3)
Chloride: 108 mmol/L (ref 98–111)
Creatinine, Ser: 1.32 mg/dL — ABNORMAL HIGH (ref 0.44–1.00)
GFR, Estimated: 44 mL/min — ABNORMAL LOW (ref >60)
Glucose, Bld: 112 mg/dL — ABNORMAL HIGH (ref 70–99)
Potassium: 3.9 mmol/L (ref 3.5–5.1)
Sodium: 138 mmol/L (ref 135–145)
Total Bilirubin: <0.2 mg/dL (ref 0.0–1.2)
Total Protein: 5.8 g/dL — ABNORMAL LOW (ref 6.5–8.1)

## 2024-11-07 LAB — CBC WITH DIFFERENTIAL/PLATELET
Abs Immature Granulocytes: 0.17 K/uL — ABNORMAL HIGH (ref 0.00–0.07)
Basophils Absolute: 0 K/uL (ref 0.0–0.1)
Basophils Relative: 0 %
Eosinophils Absolute: 0.1 K/uL (ref 0.0–0.5)
Eosinophils Relative: 1 %
HCT: 27.1 % — ABNORMAL LOW (ref 36.0–46.0)
Hemoglobin: 8.5 g/dL — ABNORMAL LOW (ref 12.0–15.0)
Immature Granulocytes: 2 %
Lymphocytes Relative: 8 %
Lymphs Abs: 0.7 K/uL (ref 0.7–4.0)
MCH: 30.4 pg (ref 26.0–34.0)
MCHC: 31.4 g/dL (ref 30.0–36.0)
MCV: 96.8 fL (ref 80.0–100.0)
Monocytes Absolute: 0.8 K/uL (ref 0.1–1.0)
Monocytes Relative: 9 %
Neutro Abs: 6.7 K/uL (ref 1.7–7.7)
Neutrophils Relative %: 80 %
Platelets: 609 K/uL — ABNORMAL HIGH (ref 150–400)
RBC: 2.8 MIL/uL — ABNORMAL LOW (ref 3.87–5.11)
RDW: 16.1 % — ABNORMAL HIGH (ref 11.5–15.5)
WBC: 8.4 K/uL (ref 4.0–10.5)
nRBC: 0 % (ref 0.0–0.2)

## 2024-11-07 LAB — GLUCOSE, CAPILLARY
Glucose-Capillary: 108 mg/dL — ABNORMAL HIGH (ref 70–99)
Glucose-Capillary: 109 mg/dL — ABNORMAL HIGH (ref 70–99)
Glucose-Capillary: 109 mg/dL — ABNORMAL HIGH (ref 70–99)
Glucose-Capillary: 111 mg/dL — ABNORMAL HIGH (ref 70–99)
Glucose-Capillary: 88 mg/dL (ref 70–99)
Glucose-Capillary: 96 mg/dL (ref 70–99)

## 2024-11-07 LAB — TYPE AND SCREEN
ABO/RH(D): A POS
Antibody Screen: NEGATIVE

## 2024-11-07 LAB — PHOSPHORUS: Phosphorus: 4.4 mg/dL (ref 2.5–4.6)

## 2024-11-07 LAB — MAGNESIUM: Magnesium: 1.7 mg/dL (ref 1.7–2.4)

## 2024-11-07 MED ORDER — HYDRALAZINE HCL 20 MG/ML IJ SOLN
10.0000 mg | INTRAMUSCULAR | Status: DC | PRN
  Administered 2024-11-07 – 2024-11-08 (×3): 10 mg via INTRAVENOUS
  Filled 2024-11-07 (×3): qty 1

## 2024-11-07 MED ORDER — KATE FARMS STANDARD 1.4 EN LIQD
1000.0000 mL | ENTERAL | Status: AC
  Administered 2024-11-08 – 2024-11-11 (×3): 1000 mL
  Filled 2024-11-07 (×5): qty 1000

## 2024-11-07 MED ORDER — PROSOURCE TF20 ENFIT COMPATIBL EN LIQD
60.0000 mL | Freq: Every day | ENTERAL | Status: AC
  Administered 2024-11-08 – 2024-11-18 (×11): 60 mL
  Filled 2024-11-07 (×11): qty 60

## 2024-11-07 MED ORDER — HYDRALAZINE HCL 50 MG PO TABS
50.0000 mg | ORAL_TABLET | Freq: Three times a day (TID) | ORAL | Status: DC
  Administered 2024-11-07 – 2024-11-08 (×3): 50 mg
  Filled 2024-11-07 (×3): qty 1

## 2024-11-07 NOTE — Plan of Care (Signed)
  Problem: Activity: Goal: Risk for activity intolerance will decrease Outcome: Progressing   Problem: Safety: Goal: Ability to remain free from injury will improve Outcome: Progressing   

## 2024-11-07 NOTE — Progress Notes (Addendum)
 Nutrition Follow-up  DOCUMENTATION CODES:   Non-severe (moderate) malnutrition in context of chronic illness  INTERVENTION:  Modify TF via Cortrak: [employer] 1.4 Standard at 40 ml/h (960 ml per day) May initiate at goal rate of 76ml/hr Add Pro-Source TF20 60 mL daily    Free water  flushes 200 ml q6h (800 ml per day) Provides 1592 kcal, 87 gm protein, 1567 ml free water  daily (TF + [facility]WF)  Continue Nutrisource Fiber BID to bulk stool   If/when diet advanced, add Boost Breeze po TID, each supplement provides 250 kcal and 9 grams of protein  Monitor GOC discussion and modify plan of care as indicated    NUTRITION DIAGNOSIS:  Moderate Malnutrition related to chronic illness as evidenced by moderate muscle depletion, percent weight loss (15% in 6 months). - remains applicable  GOAL:  Patient will meet greater than or equal to 90% of their needs - meeting via TF  MONITOR:  Diet advancement, PO intake, TF tolerance  REASON FOR ASSESSMENT:   Consult Enteral/tube feeding initiation and management (Trickle TF)  ASSESSMENT:   68 y.o. female with PMH GERD. IBS, CAD, HTN who presented for elective hernia repair.  12/3 s/p hernia repair; became hypotensive after surgery requiring vasopressors, transferred to the ICU 12/4 returned to OR for ex-lap, abdominal packing and wound vac application; NGT placed; returned to ICU intubated 12/5 returned to OR for open abdomen - no evidence of ongoing bleeding and abdomen closed  12/7 tube feedings started  12/9 transfer to [facility] ICU 12/11 extubated  12/15 transfer to [facility] team - GOC discussion w/ husband: full scope 12/17 transfer out of ICU; MBS: NPO; general surgery eval for PEG 12/18 modify to [employer] TF formula   Patient transfer out of ICU yesterday. Tube feeds continue as speech recommending NPO s/p MBS yesterday. General surgery recommending PEG tube. Evaluation ongoing, per trauma team. Speech working with patient and hopeful to  advance diet as attending noting PEG not preferred option with recent abdominal surgeries.   Met with patient at bedside who tracks RD movements, but nonverbal. Did shake head slightly to some yes or no questions, but sporadically. Tube feed infusing at goal rate.   Admit Weight: 44.4 kg Current Weight: 48.2 kg  Weight stable with slight trend up this admission. No significant edema on exam. RN reports stool output is a lot, but no quantity of output documented. Continues on Nutrisource fiber.   Will modify to [facility] 1.4 Standard formula to provide less osmolality and overall volume in an attempt to bulk stool output and lower infusion rate. Discussed with RN via secure chat. Slated to start tomorrow morning. May initiate at goal rate as she is out of refeeding window and goal rate is below previous goal rate with Vital AF 1.2.   Drains/Lines: Cortrak (gastric): placed 12/12 UOP: x 24 hours +1 unmeasured occurrence  Meds: Nutrisource fiber BID, levothyroxine , pantoprazole ,   BUN stable. Crt trending down. Electrolytes have stabilized. Has not required potassium, magnesium , or phosphorus repletion in last few days.   Labs:  Na+ 146--->138 (wdl) K+ 2.9--->3.9 (wdl) PHOS 2.4--->4.4 (wdl) Mg 1/5--->1/7 (wdl) BUN 36 Crt 1.32 WBC 8.4 (wdl) CBGs 99-112 x24 hours A1c 4.9 (09/2024)   Diet Order:   Diet Order             Diet NPO time specified Except for: Ice Chips  Diet effective now  EDUCATION NEEDS:   Not appropriate for education at this time  Skin:  Skin Assessment: Skin Integrity Issues: Skin Integrity Issues:: Incisions Incisions: Surgical Abdomen  Last BM:  12/18 - type 7 via rectal pouch  Height:  Ht Readings from Last 1 Encounters:  10/25/24 5' 3 (1.6 m)   Weight:  Wt Readings from Last 1 Encounters:  11/06/24 48.2 kg    Ideal Body Weight:  52.27 kg  BMI:  Body mass index is 18.82 kg/m.  Estimated Nutritional Needs:    Kcal:  1500-1700  Protein:  75-90 grams  Fluid:  >/= 1.5L  Blair Deaner MS, RD, LDN Registered Dietitian Clinical Nutrition RD Inpatient Contact Info in Amion

## 2024-11-07 NOTE — Care Management Important Message (Signed)
 Important Message  Patient Details  Name: Erin [name] MRN: [id number] Date of Birth: June 14, 1956   Important Message Given:  Yes - Medicare IM     Claretta Deed 11/07/2024, 3:06 PM

## 2024-11-07 NOTE — Progress Notes (Signed)
 Speech Language Pathology Treatment: Dysphagia  Patient Details Name: Erin [name] MRN: [id number] DOB: Mar 26, 1956 Today's Date: 11/07/2024 Time: 1011-1025 SLP Time Calculation (min) (ACUTE ONLY): 14 min  Assessment / Plan / Recommendation Clinical Impression  SLP conducted skilled therapy session targeting swallowing goals. Patient awake upon SLP arrival and much more alert compared to previous session. She is alert but continues to require max cuing to respond to questioning, often only responding to stimuli gesturally. SLP provided oral care via suction toothbrush though patient required max encouragement from both SLP and husband to allow oral care, at first refusing. Patient likewise refused all attempts at PO administration but with assist of husband was eventually amenable to one ice chip. Ice chip was tolerated with no overt s/sx of penetration/aspiration, but aspiration on MBS was silent thus bedside indicators of aspiration are unreliable. Patient was left in room with call bell in reach and alarm set. SLP will continue to target goals per plan of care.     HPI HPI: Mrs. Vantuyl is a 68 y.o. F  adm to [facility] on 12/3 for ventral hernia repair. 12/4 ex lap with abdominal hemorrhage and remained intubated post op. 12/5 abdominal closure. 12/8 MRI with multiple embolic infarcts. 12/9 transfer to [facility]. 12/11 extubated. PMHx: anal CA, hypothyroidism, GERD, CAD, cardiomyopathy, PVD, HLD, HTN, RLS. SLP consulted for cogntitive-linguistic assessment and clinical swallow assessment. Pt with a hx of mild pharyngeal dysphagia and suspected primary esophageal dysphagia per MBSS completed 05/06/22      SLP Plan  Continue with current plan of care        Swallow Evaluation Recommendations   Recommendations: NPO;Alternative means of nutrition - NG Tube Medication Administration: Via alternative means Oral care recommendations: Oral care BID (2x/day) Caregiver Recommendations: Have oral suction  available     Recommendations                     Oral care QID;Oral care prior to ice chip/H20   Frequent or constant Supervision/Assistance Dysphagia, pharyngeal phase (R13.13)     Continue with current plan of care     Deloyce Walthers A Lynia Landry  11/07/2024, 12:33 PM

## 2024-11-07 NOTE — Progress Notes (Signed)
 PROGRESS NOTE    Erin [name]  FMW:[id number] DOB: 09-Nov-1956 DOA: 10/23/2024 PCP: [name] [name], MD   Brief Narrative:  68 year old woman who presented to [facility] 12/3 for elective hernia repair. 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  Patient admitted initially for postop hernia repair found to have hemoperitoneum with hemorrhagic shock postoperatively requiring intubation and pressors.  Her course was subsequently complicated by bilateral strokes thought to be provoked by hypotension.  At this time patient is stabilizing, she has been extubated, continues tube feeds via NG and is otherwise stable from a surgical standpoint.  Transition out of the ICU to hospitalist team on 12/15 with plan for ongoing care with ultimate plan for disposition pending patient's clinical course.  Hospital course 12/3 admitted to ICU postop 12/3-post hernia repair 12/4 taken back to the OR for hemoperitoneum-active bleed was not identified, abdomen packed with wound VAC in place 12/5 back to the OR for an open abdomen-no evidence of ongoing bleeding and abdomen was closed 12/8 - CT Head with interval development (since 04/2024) of multiple cortical and subcortical hypodensities in bilateral cerebellar and cerebral hemispheres suggestive of acute/subacute infarcts. MRI Brain with numerous areas of restricted diffusion throughout the cerebral hemispheres/cerebellum c/f acute infarcts of embolic etiology, associated mild edema (R occipital lobe, L temporal lobe).  12/11 tolerating PSV 5/5 >> extubated 12/15 transition to TRH team - goals of care discussion with husband(see below) 12/17 General surgery evaluating for PEG -pending SLP re-evaluation in hopes she will be able to  improve -LTAC was denied by insurance  Assessment & Plan:    Hemorrhagic shock from hypovolemia caused during ex lap hernia repair SHOCK - required stay in ICU, pressors, received massive blood transfusion protocol with 5 units of packed RBC and 2 units of cryoprecipitate around 10/23/2024, unfortunately incurred anoxic brain injury/CVA, brief support with intubation.  Shock has resolved, monitor with supportive care.  Acute bilateral cerebral and cerebellar infarcts, Encephalopathy, multifactorial  - CT head with multiple cortical and subcortical hypodensities, confirmed by MRI, likely due to profound hypotension from #1 above, seen by neuro, patient remains minimally verbal, has dysphagia requiring NG tube feeds, may require peg tube, speech PT OT working.  Currently on aspirin .  Seen by stroke team.  Continue aspirin , stable A1c and LDL.  Stable EEG.  Echocardiogram with preserved EF of 60%.  MRI stable.    HTN  - Continue Amlodipine , Carvedilol , added hydralazine  for better blood pressure control, continue as needed IV hydralazine .    S/p hernia repair, elective 12/3, S/p ex-lap 12/4, 12/5  - Postoperative management per CCS,  Wound care per surgery   Acute kidney injury to ATN from shock.  History of left renal artery stenosis.  Renal function improving.  Monitor   Acute respiratory insufficiency , extubated 12/11 - happened due to anoxic brain injury/CVA.  Resolved.  HCAP -Enterobacter 12/6  - Completed antibiotic treatment.    DVT prophylaxis: heparin  injection 5,000 Units Start: 10/28/24 2200 SCD's Start: 10/23/24 1918 Code Status:   Code Status: Full Code Family Communication: Husband at bedside on 11/07/2024  Status is: Inpt  Dispo: LTAC  Consultants:  PCCM, General surgery, Neuro  Procedures:  Hernia repair 12/3 and ex-lap 12/5   Antimicrobials:  None(completed prior course)   Subjective: No acute issues/events overnight -  review of systems limited by patient, husband  indicates patient slept well overnight but is having difficulty waking today  Objective: Vitals:   11/07/24 0351 11/07/24 0723 11/07/24 0800 11/07/24 0905  BP: (!) 171/67 (!) 180/68 (!) 167/70 (!) 162/68  Pulse: 76 81 82   Resp: 16 17 20 20   Temp: 97.9 F (36.6 C) 97.6 F (36.4 C)    TempSrc: Axillary Axillary    SpO2: 100% 97% 97%   Weight:      Height:        Intake/Output Summary (Last 24 hours) at 11/07/2024 1046 Last data filed at 11/07/2024 9081 Gross per 24 hour  Intake 800 ml  Output 1100 ml  Net -300 ml   Filed Weights   11/04/24 0400 11/05/24 0500 11/06/24 0500  Weight: 48.4 kg 49.4 kg 48.2 kg    Examination:  Awake but remains nonverbal, does open eyes to loud commands from time to time but inconsistent, withdraws to pain, NG tube in place, [place].AT,PERRAL Supple Neck, No JVD,   Symmetrical Chest wall movement, Good air movement bilaterally, CTAB RRR,No Gallops, Rubs or new Murmurs,  +ve B.Sounds, Abd Soft, No tenderness, midline abdominal postop scar stable under bandage No Cyanosis, Clubbing or edema      Data Review:   Patient Lines/Drains/Airways Status     Active Line/Drains/Airways     Name Placement date Placement time Site Days   Peripheral IV 10/23/24 20 G Anterior;Right Forearm 10/23/24  1430  Forearm  15   Peripheral IV 10/23/24 20 G 1.75 Anterior;Left Forearm 10/23/24  2037  Forearm  15   Flatus Tube/Pouch 11/01/24  0115  --  6   External Urinary Catheter 11/01/24  1900  --  6   Small Bore Feeding Tube Left nare Marking at nare/corner of mouth 68 cm 11/01/24  1536  Left nare  6   Wound 10/23/24 0923 Surgical Closed Surgical Incision Abdomen 10/23/24  0923  Abdomen  15   Wound 10/23/24 9076 Surgical Laparoscopic Abdomen 1: Upper;Left 2: Left;Lower 10/23/24  0923  Abdomen  15   Wound 11/01/24 1600 Irritant Contact Dermatitis Buttocks 11/01/24  1600  Buttocks  6             Inpatient Medications  Scheduled Meds:  amLODipine   10 mg  Per Tube Daily   aspirin   81 mg Per Tube Daily   carvedilol   12.5 mg Per Tube BID WC   Chlorhexidine  Gluconate Cloth  6 each Topical Daily   fiber  1 packet Per Tube BID   free water   200 mL Per Tube Q8H   Gerhardt's butt cream   Topical BID   heparin  injection (subcutaneous)  5,000 Units Subcutaneous Q8H   Influenza vac split trivalent PF  0.5 mL Intramuscular Tomorrow-1000   levothyroxine   75 mcg Per Tube Q0600   melatonin  3 mg Per Tube QHS   methylphenidate   5 mg Oral q morning   mouth rinse  15 mL Mouth Rinse 4 times per day   pantoprazole  (PROTONIX ) IV  40 mg Intravenous QHS   Continuous Infusions:  feeding supplement (VITAL AF 1.2 CAL) 50 mL/hr at 11/06/24 1600   PRN Meds:.acetaminophen , hydrALAZINE , labetalol , methocarbamol , ondansetron  **OR** ondansetron  (ZOFRAN ) IV, mouth rinse, oxyCODONE   DVT Prophylaxis  heparin  injection 5,000 Units Start: 10/28/24 2200 SCD's Start: 10/23/24 1918       Recent Labs  Lab 11/01/24 0403 11/02/24 0237 11/03/24 0438 11/04/24 0436 11/07/24 0719  WBC 14.8* 13.4* 9.5 10.4 8.4  HGB 8.3* 8.9* 8.7* 8.9* 8.5*  HCT 26.2* 26.7* 27.1* 28.1* 27.1*  PLT 252 310 414* 476* 609*  MCV 94.2 93.0 93.8 96.9 96.8  MCH 29.9 31.0 30.1 30.7 30.4  MCHC 31.7 33.3 32.1 31.7 31.4  RDW 15.9* 15.6* 15.5 15.7* 16.1*  LYMPHSABS  --   --   --  0.9 0.7  MONOABS  --   --   --  1.2* 0.8  EOSABS  --   --   --  0.1 0.1  BASOSABS  --   --   --  0.0 0.0    Recent Labs  Lab 11/01/24 0403 11/02/24 0237 11/03/24 0438 11/04/24 0436 11/07/24 0719  NA 141 137 138 141 138  K 3.0* 3.3* 3.3* 4.3 3.9  CL 107 106 108 113* 108  CO2 24 23 20* 19* 20*  ANIONGAP 10 8 10 9 10   GLUCOSE 145* 119* 113* 99 112*  BUN 46* 39* 37* 34* 36*  CREATININE 2.02* 1.68* 1.57* 1.50* 1.32*  AST  --   --   --   --  19  ALT  --   --   --   --  12  ALKPHOS  --   --   --   --  89  BILITOT  --   --   --   --  <0.2  ALBUMIN   --   --   --   --  3.2*  MG 1.5* 2.1 1.8 1.9 1.7  PHOS  2.4* 3.2 3.4 3.5 4.4  CALCIUM  7.3* 7.4* 7.8* 8.1* 8.8*      Recent Labs  Lab 11/01/24 0403 11/02/24 0237 11/03/24 0438 11/04/24 0436 11/07/24 0719  MG 1.5* 2.1 1.8 1.9 1.7  CALCIUM  7.3* 7.4* 7.8* 8.1* 8.8*    --------------------------------------------------------------------------------------------------------------- Lab Results  Component Value Date   CHOL 91 10/29/2024   HDL 12 (L) 10/29/2024   LDLCALC 13 10/29/2024   LDLDIRECT 70.0 04/07/2021   TRIG 331 (H) 10/29/2024   CHOLHDL 7.6 10/29/2024    Lab Results  Component Value Date   HGBA1C 4.9 10/18/2024   No results for input(s): TSH, T4TOTAL, FREET4, T3FREE, THYROIDAB in the last 72 hours. No results for input(s): VITAMINB12, FOLATE, FERRITIN, TIBC, IRON, RETICCTPCT in the last 72 hours. ------------------------------------------------------------------------------------------------------------------ Cardiac Enzymes No results for input(s): CKMB, TROPONINI, MYOGLOBIN in the last 168 hours.  Invalid input(s): CK  Micro Results Recent Results (from the past 240 hours)  Culture, Respiratory w Gram Stain     Status: None   Collection Time: 10/29/24 11:46 AM   Specimen: Tracheal Aspirate; Respiratory  Result Value Ref Range Status   Specimen Description TRACHEAL ASPIRATE  Final   Special Requests NONE  Final   Gram Stain   Final    ABUNDANT WBC PRESENT, PREDOMINANTLY PMN RARE GRAM NEGATIVE RODS RARE GRAM POSITIVE COCCI IN PAIRS    Culture   Final    FEW Normal respiratory flora-no Staph aureus or Pseudomonas seen Performed at [facility], [address], [place], [place] [address]    Report Status 10/31/2024 FINAL  Final  MRSA Next Gen by PCR, Nasal     Status: None   Collection Time: 10/29/24 12:11 PM   Specimen: Nasal Mucosa; Nasal Swab  Result Value Ref Range Status   MRSA by PCR Next Gen NOT DETECTED NOT DETECTED Final    Comment: (NOTE) The GeneXpert MRSA Assay  (FDA approved for NASAL specimens only), is one component of a comprehensive MRSA colonization  surveillance program. It is not intended to diagnose MRSA infection nor to guide or monitor treatment for MRSA infections. Test performance is not FDA approved in patients less than 55 years old. Performed at [facility], [address] [place], [place] [address]   Gastrointestinal Panel by PCR , Stool     Status: None   Collection Time: 11/02/24  4:03 AM   Specimen: Stool  Result Value Ref Range Status   Campylobacter species NOT DETECTED NOT DETECTED Final   Plesimonas shigelloides NOT DETECTED NOT DETECTED Final   Salmonella species NOT DETECTED NOT DETECTED Final   Yersinia enterocolitica NOT DETECTED NOT DETECTED Final   Vibrio species NOT DETECTED NOT DETECTED Final   Vibrio cholerae NOT DETECTED NOT DETECTED Final   Enteroaggregative E coli (EAEC) NOT DETECTED NOT DETECTED Final   Enteropathogenic E coli (EPEC) NOT DETECTED NOT DETECTED Final   Enterotoxigenic E coli (ETEC) NOT DETECTED NOT DETECTED Final   Shiga like toxin producing E coli (STEC) NOT DETECTED NOT DETECTED Final   Shigella/Enteroinvasive E coli (EIEC) NOT DETECTED NOT DETECTED Final   Cryptosporidium NOT DETECTED NOT DETECTED Final   Cyclospora cayetanensis NOT DETECTED NOT DETECTED Final   Entamoeba histolytica NOT DETECTED NOT DETECTED Final   Giardia lamblia NOT DETECTED NOT DETECTED Final   Adenovirus F40/41 NOT DETECTED NOT DETECTED Final   Astrovirus NOT DETECTED NOT DETECTED Final   Norovirus GI/GII NOT DETECTED NOT DETECTED Final   Rotavirus A NOT DETECTED NOT DETECTED Final   Sapovirus (I, II, IV, and V) NOT DETECTED NOT DETECTED Final    Comment: Performed at [facility], [address] [place], [place] [address]  C Difficile Quick Screen (NO PCR Reflex)     Status: None   Collection Time: 11/02/24 12:23 PM   Specimen: STOOL  Result Value Ref Range Status   C Diff antigen NEGATIVE  NEGATIVE Final   C Diff toxin NEGATIVE NEGATIVE Final   C Diff interpretation No C. difficile detected.  Final    Comment: Performed at [facility], [address] [place], [place] [address]    Radiology Reports  DG Chest [place] 1 View Result Date: 11/07/2024 EXAM: 1 VIEW(S) XRAY OF THE CHEST 11/07/2024 06:03:23 AM COMPARISON: 11/01/2024 CLINICAL HISTORY: SOB (shortness of breath) FINDINGS: LINES, TUBES AND DEVICES: Enteric tube below diaphragm with distal tip beyond inferior margin of film. Left renal artery stent noted. LUNGS AND PLEURA: Right basilar airspace opacity. Left basilar atelectasis. No pleural effusion. No pneumothorax. HEART AND MEDIASTINUM: Aortic atherosclerosis. No acute abnormality of the cardiac silhouette. BONES AND SOFT TISSUES: No acute osseous abnormality. IMPRESSION: 1. Right basilar airspace opacity and left basilar atelectasis. Electronically signed by: Waddell Calk MD 11/07/2024 06:35 AM EST RP Workstation: [id number]   DG Swallowing Func-Speech Pathology Result Date: 11/06/2024 Table formatting from the original result was not included. Modified Barium Swallow Study Patient Details Name: Erin [name] MRN: [id number] Date of Birth: 03-08-1956 Today's Date: 11/06/2024 HPI/PMH: HPI: Mrs. Tourville is a 68 y.o. F  adm to [facility] on 12/3 for ventral hernia repair. 12/4 ex lap with abdominal hemorrhage and remained intubated post op. 12/5 abdominal closure. 12/8 MRI with multiple embolic infarcts. 12/9 transfer to [facility]. 12/11 extubated. PMHx: anal CA, hypothyroidism, GERD, CAD, cardiomyopathy, PVD, HLD, HTN, RLS. SLP consulted for cogntitive-linguistic assessment and clinical swallow assessment. Pt with a hx of mild pharyngeal dysphagia and suspected primary esophageal dysphagia per MBSS completed 05/06/22 Clinical Impression: Clinical Impression: Patient  presents with severe pharyngeal dysphagia characterized by impaired effieciency and safety across all administered  textures. At baseline, patient has mild pharyngeal dysphagia and suspect given current status, patient is unable to compensate for efficiency deficits resulting in airway compromise. Patient exhibits reduced hyolaryngeal excursion/elevation, epiglottic inversion (with epiglottis also abutting against posterior pharyngeal wall further preventing full inversion), pharyngeal stripping wave, and resultant closure of the laryngeal vestibule. Patient aspirates thin liquids silently during the swallow and mix of residue from thin, NTL, and HTLs after the swallow as appreciated by movement of residue material into the airway between images. Given current reduced cognitive status, patient is unable to follow commands adequately to trial any compensatory strategies. Recommend NPO at this time with medications adminsitere via alternative means. SLP will continue to follow. Factors that may increase risk of adverse event in presence of aspiration Noe & Lianne 2021): Factors that may increase risk of adverse event in presence of aspiration Noe & Lianne 2021): Weak cough; Reduced cognitive function; Poor general health and/or compromised immunity Recommendations/Plan: Swallowing Evaluation Recommendations Swallowing Evaluation Recommendations Recommendations: NPO Medication Administration: Via alternative means Oral care recommendations: Oral care QID (4x/day) Caregiver Recommendations: Have oral suction available Treatment Plan Treatment Plan Treatment recommendations: Therapy as outlined in treatment plan below Follow-up recommendations: Follow physicians's recommendations for discharge plan and follow up therapies Functional status assessment: Patient has had a recent decline in their functional status and demonstrates the ability to make significant improvements in function in a reasonable and predictable amount of time. Treatment frequency: Min 2x/week Treatment duration: 2 weeks Interventions: Aspiration  precaution training; Oropharyngeal exercises; Compensatory techniques; Patient/family education; Trials of upgraded texture/liquids; Diet toleration management by SLP Recommendations Recommendations for follow up therapy are one component of a multi-disciplinary discharge planning process, led by the attending physician.  Recommendations may be updated based on patient status, additional functional criteria and insurance authorization. Assessment: Orofacial Exam: Orofacial Exam Oral Cavity: Oral Hygiene: WFL Oral Cavity - Dentition: Missing dentition Orofacial Anatomy: WFL Oral Motor/Sensory Function: -- (unable to assess d/t cognitive deficits) Anatomy: Anatomy: WFL Boluses Administered: Boluses Administered Boluses Administered: Thin liquids (Level 0); Mildly thick liquids (Level 2, nectar thick); Moderately thick liquids (Level 3, honey thick); Puree  Oral Impairment Domain: Oral Impairment Domain Lip Closure: No labial escape Tongue control during bolus hold: -- (unable to complete d/t impaired cognition) Bolus transport/lingual motion: Brisk tongue motion Oral residue: Trace residue lining oral structures Location of oral residue : Tongue; Palate Initiation of pharyngeal swallow : Valleculae  Pharyngeal Impairment Domain: Pharyngeal Impairment Domain Soft palate elevation: No bolus between soft palate (SP)/pharyngeal wall (PW) Laryngeal elevation: Partial superior movement of thyroid  cartilage/partial approximation of arytenoids to epiglottic petiole Anterior hyoid excursion: Partial anterior movement Epiglottic movement: Partial inversion Laryngeal vestibule closure: Incomplete, narrow column air/contrast in laryngeal vestibule Pharyngeal stripping wave : Present - diminished Pharyngeal contraction (A/P view only): N/A Pharyngoesophageal segment opening: Complete distension and complete duration, no obstruction of flow Tongue base retraction: Narrow column of contrast or air between tongue base and PPW  Pharyngeal residue: Collection of residue within or on pharyngeal structures Location of pharyngeal residue: Valleculae  Esophageal Impairment Domain: No data recorded Pill: No data recorded Penetration/Aspiration Scale Score: Penetration/Aspiration Scale Score 1.  Material does not enter airway: Puree 8.  Material enters airway, passes BELOW cords without attempt by patient to eject out (silent aspiration) : Thin liquids (Level 0); Mildly thick liquids (Level 2, nectar thick); Moderately thick liquids (Level 3, honey thick) Compensatory Strategies:  Compensatory Strategies Compensatory strategies: No   General Information: Caregiver present: No  Diet Prior to this Study: NPO   Temperature : Normal   Respiratory Status: WFL   Supplemental O2: None (Room air)   History of Recent Intubation: No  Behavior/Cognition: Alert; Doesn't follow directions Self-Feeding Abilities: Dependent for feeding Baseline vocal quality/speech: Hypophonia/low volume Volitional Cough: Unable to elicit Volitional Swallow: Unable to elicit Exam Limitations: Poor positioning; Limited visibility Goal Planning: Prognosis for improved oropharyngeal function: Fair Barriers to Reach Goals: Cognitive deficits; Severity of deficits No data recorded Patient/Family Stated Goal: continue to progress Consulted and agree with results and recommendations: Patient Pain: Pain Assessment Pain Assessment: No/denies pain Facial Expression: 0 Body Movements: 0 Muscle Tension: 0 Compliance with ventilator (intubated pts.): N/A Vocalization (extubated pts.): 0 CPOT Total: 0 Pain Intervention(s): Limited activity within patient's tolerance; Monitored during session; Repositioned End of Session: Start Time:SLP Start Time (ACUTE ONLY): 1159 Stop Time: SLP Stop Time (ACUTE ONLY): 1214 Time Calculation:SLP Time Calculation (min) (ACUTE ONLY): 15 min Charges: SLP Evaluations $ SLP Speech Visit: 1 Visit SLP Evaluations $MBS Swallow: 1 Procedure $Swallowing Treatment: 1  Procedure SLP visit diagnosis: SLP Visit Diagnosis: Dysphagia, pharyngeal phase (R13.13) Past Medical History: Past Medical History: Diagnosis Date  Allergy   Alopecia   Anal cancer (HCC) 08/14/2013  invasive squamous cell ca, s/p radiation 10/20-11/26/14 60.4Gy/40fx and chemo  Anxiety   Aortoiliac occlusive disease (HCC) 08/14/2017  Arthritis   B12 deficiency anemia 09/14/2015  Blood transfusion without reported diagnosis   BPPV (benign paroxysmal positional vertigo) 07/08/2015  Cardiomyopathy (HCC) 11/03/2017  Chronic back pain   Chronic daily headache 03/29/2013  takes bc powder  Closed right hip fracture (HCC) 09/10/2015  Coronary artery disease involving native coronary artery of native heart without angina pectoris 10/13/2017  DES to mid RCA  Depression   Essential (hemorrhagic) thrombocythemia (HCC) 09/06/2018  Essential hypertension 09/25/2023  Family history of early CAD 04/08/2016  GERD (gastroesophageal reflux disease)   History of hiatal hernia   Hot flashes   Hyperlipidemia   Hyperlipidemia associated with type 2 diabetes mellitus (HCC) 05/11/2017  Hypothyroidism 09/10/2015  IBS (irritable bowel syndrome) 03/29/2013  Neck pain 07/08/2015  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  takes neurotin  Peripheral vascular disease 11/03/2017  QT prolongation   Sacroiliitis 09/06/2018  Spasms of the hands or feet 10/08/2017  Syncope 06/07/2016  Tubular adenoma of colon 09/08/2003  Vertigo   Wears glasses  Past Surgical History: Past Surgical History: Procedure Laterality Date  ABDOMINAL AORTOGRAM N/A 08/02/2017  Procedure: ABDOMINAL AORTOGRAM;  Surgeon: Darron Deatrice LABOR, MD;  Location: [facility];  Service: Cardiovascular;  Laterality: N/A;  ABDOMINAL AORTOGRAM W/LOWER EXTREMITY Bilateral 09/03/2024  Procedure: ABDOMINAL AORTOGRAM W/LOWER EXTREMITY;  Surgeon: Serene Gaile ORN, MD;  Location: [facility];  Service: Cardiovascular;  Laterality: Bilateral;  AORTA - BILATERAL FEMORAL ARTERY BYPASS GRAFT N/A  08/14/2017  Procedure: AORTA BIFEMORAL BYPASS GRAFT;  Surgeon: Oris Krystal FALCON, MD;  Location: [facility] OR;  Service: Vascular;  Laterality: N/A;  COLONOSCOPY    COLONOSCOPY N/A 08/29/2024  Procedure: COLONOSCOPY;  Surgeon: Wilhelmenia Aloha Raddle., MD;  Location: [facility];  Service: Gastroenterology;  Laterality: N/A;  CORONARY STENT INTERVENTION N/A 10/13/2017  Procedure: CORONARY STENT INTERVENTION;  Surgeon: Mady Bruckner, MD;  Location: [facility];  Service: Cardiovascular;  Laterality: N/A;  dental implant    ECTOPIC PREGNANCY SURGERY    EMBOLECTOMY N/A 08/14/2017  Procedure: EMBOLECTOMY FEMORAL;  Surgeon: Oris Krystal FALCON, MD;  Location: [facility] OR;  Service: Vascular;  Laterality: N/A;  FEMORAL-POPLITEAL BYPASS GRAFT Right 08/14/2017  Procedure: Right Femoral to Above Knee Popliteal Bypass Graft using Non-Reversed Greater Saphenous Vein Graft from Right Leg;  Surgeon: Oris Krystal FALCON, MD;  Location: [facility] OR;  Service: Vascular;  Laterality: Right;  FLEXIBLE SIGMOIDOSCOPY N/A 08/14/2013  Procedure: FLEXIBLE SIGMOIDOSCOPY;  Surgeon: Gwendlyn ONEIDA Buddy, MD;  Location: [facility];  Service: Endoscopy;  Laterality: N/A;  LAPAROTOMY N/A 10/24/2024  Procedure: EXPLORATORY LAPAROTOMY, ABDOMINAL PACKING AND WOUND VAC APPLICATION;  Surgeon: Debby Hila, MD;  Location: [facility]S;  Service: General;  Laterality: N/A;  (8) EIGHT 18X18 LAP SPONGE PACKED  LAPAROTOMY N/A 10/25/2024  Procedure: LAPAROTOMY, EXPLORATORY;  Surgeon: Rubin Calamity, MD;  Location: [facility];  Service: General;  Laterality: N/A;  REMOVAL OF PACKING  LEFT HEART CATH AND CORONARY ANGIOGRAPHY N/A 10/13/2017  Procedure: LEFT HEART CATH AND CORONARY ANGIOGRAPHY;  Surgeon: Mady Bruckner, MD;  Location: [facility];  Service: Cardiovascular;  Laterality: N/A;  LOWER EXTREMITY ANGIOGRAPHY Bilateral 08/02/2017  Procedure: Lower Extremity Angiography;  Surgeon: Darron Deatrice LABOR, MD;  Location: [facility] INV[facility]IVE [facility] LAB;  Service: Cardiovascular;  Laterality: Bilateral;   LOWER EXTREMITY INTERVENTION N/A 09/03/2024  Procedure: LOWER EXTREMITY INTERVENTION;  Surgeon: Serene Gaile ORN, MD;  Location: [facility];  Service: Cardiovascular;  Laterality: N/A;  MULTIPLE TOOTH EXTRACTIONS    PERIPHERAL VASCULAR INTERVENTION  05/22/2018  Procedure: PERIPHERAL VASCULAR INTERVENTION;  Surgeon: Serene Gaile ORN, MD;  Location: [facility];  Service: Cardiovascular;;  SMA and Celiac  PILONIDAL CYST EXCISION    POLYPECTOMY    THROMBECTOMY FEMORAL ARTERY Right 08/14/2017  Procedure: THROMBECTOMY FEMORAL ARTERY;  Surgeon: Oris Krystal FALCON, MD;  Location: [facility] OR;  Service: Vascular;  Laterality: Right;  TONSILLECTOMY    TOTAL HIP ARTHROPLASTY  09/11/2015  Procedure: TOTAL HIP ARTHROPLASTY;  Surgeon: Evalene JONETTA Chancy, MD;  [facility];  Service: Orthopedics;;  ULTRASOUND GUIDANCE FOR VASCULAR ACCESS  10/13/2017  Procedure: Ultrasound Guidance For Vascular Access;  Surgeon: Mady Bruckner, MD;  [facility];  Service: Cardiovascular;;  VENTRAL HERNIA REPAIR N/A 10/23/2024  Procedure: REPAIR, HERNIA, VENTRAL, LAPAROSCOPIC;  Surgeon: Rubin Calamity, MD;  Location: [facility];  Service: General;  Laterality: N/A;  VISCERAL ANGIOGRAPHY N/A 03/03/2020  Procedure: MESTENRIC ANGIOGRAPHY;  Surgeon: Serene Gaile ORN, MD;  [facility];  Service: Cardiovascular;  Laterality: N/A;  VISCERAL ANGIOGRAPHY N/A 09/03/2024  Procedure: VISCERAL ANGIOGRAPHY;  Surgeon: Serene Gaile ORN, MD;  [facility];  Service: Cardiovascular;  Laterality: N/A;  VISCERAL ARTERY INTERVENTION N/A 09/03/2024  Procedure: VISCERAL ARTERY INTERVENTION;  Surgeon: Serene Gaile ORN, MD;  [facility];  Service: Cardiovascular;  Laterality: N/A; Ashley A Ellin 11/06/2024, 2:59 PM     Signature  -   Lavada Stank M.D on 11/07/2024 at 10:46 AM   -  To page go to [email or web address]

## 2024-11-07 NOTE — Plan of Care (Signed)
°  Problem: Health Behavior/Discharge Planning: Goal: Goals will be collaboratively established with patient/family Outcome: Progressing   Problem: Coping: Goal: Will identify appropriate support needs Outcome: Progressing   Problem: Ischemic Stroke/TIA Tissue Perfusion: Goal: Complications of ischemic stroke/TIA will be minimized Outcome: Progressing

## 2024-11-07 NOTE — Progress Notes (Signed)
 13 Days Post-Op   Subjective/Chief Complaint: Pt with no acute events   Objective: Vital signs in last 24 hours: Temp:  [97.6 F (36.4 C)-99.1 F (37.3 C)] 97.9 F (36.6 C) (12/18 0351) Pulse Rate:  [71-102] 76 (12/18 0351) Resp:  [16-24] 16 (12/18 0351) BP: (152-181)/(59-78) 171/67 (12/18 0351) SpO2:  [93 %-100 %] 100 % (12/18 0351) Last BM Date : 11/06/24  Intake/Output from previous day: 12/17 0701 - 12/18 0700 In: 480 [NG/GT:480] Out: 500 [Urine:500] Intake/Output this shift: Total I/O In: 200 [NG/GT:200] Out: 500 [Urine:500]  PE:  Constitutional: No acute distress,  appears states age. Eyes: Anicteric sclerae, moist conjunctiva, no lid lag Lungs: Clear to auscultation bilaterally, normal respiratory effort CV: regular rate and rhythm, no murmurs, no peripheral edema, pedal pulses 2+ GI: Soft, no masses or hepatosplenomegaly, non-tender to palpation Skin: No rashes, palpation reveals normal turgor Neuro: tracks and min interaction, non verbal   Lab Results:  No results for input(s): WBC, HGB, HCT, PLT in the last 72 hours. BMET No results for input(s): NA, K, CL, CO2, GLUCOSE, BUN, CREATININE, CALCIUM  in the last 72 hours. PT/INR No results for input(s): LABPROT, INR in the last 72 hours. ABG No results for input(s): PHART, HCO3 in the last 72 hours.  Invalid input(s): PCO2, PO2  Studies/Results: DG Swallowing Func-Speech Pathology Result Date: 11/06/2024 Table formatting from the original result was not included. Modified Barium Swallow Study Patient Details Name: Erin [name] MRN: [id number] Date of Birth: 02/18/1956 Today's Date: 11/06/2024 HPI/PMH: HPI: Erin [name] is a 68 y.o. F  adm to [facility] on 12/3 for ventral hernia repair. 12/4 ex lap with abdominal hemorrhage and remained intubated post op. 12/5 abdominal closure. 12/8 MRI with multiple embolic infarcts. 12/9 transfer to [facility]. 12/11 extubated. PMHx: anal CA,  hypothyroidism, GERD, CAD, cardiomyopathy, PVD, HLD, HTN, RLS. SLP consulted for cogntitive-linguistic assessment and clinical swallow assessment. Pt with a hx of mild pharyngeal dysphagia and suspected primary esophageal dysphagia per MBSS completed 05/06/22 Clinical Impression: Clinical Impression: Patient presents with severe pharyngeal dysphagia characterized by impaired effieciency and safety across all administered textures. At baseline, patient has mild pharyngeal dysphagia and suspect given current status, patient is unable to compensate for efficiency deficits resulting in airway compromise. Patient exhibits reduced hyolaryngeal excursion/elevation, epiglottic inversion (with epiglottis also abutting against posterior pharyngeal wall further preventing full inversion), pharyngeal stripping wave, and resultant closure of the laryngeal vestibule. Patient aspirates thin liquids silently during the swallow and mix of residue from thin, NTL, and HTLs after the swallow as appreciated by movement of residue material into the airway between images. Given current reduced cognitive status, patient is unable to follow commands adequately to trial any compensatory strategies. Recommend NPO at this time with medications adminsitere via alternative means. SLP will continue to follow. Factors that may increase risk of adverse event in presence of aspiration Noe & Lianne 2021): Factors that may increase risk of adverse event in presence of aspiration Noe & Lianne 2021): Weak cough; Reduced cognitive function; Poor general health and/or compromised immunity Recommendations/Plan: Swallowing Evaluation Recommendations Swallowing Evaluation Recommendations Recommendations: NPO Medication Administration: Via alternative means Oral care recommendations: Oral care QID (4x/day) Caregiver Recommendations: Have oral suction available Treatment Plan Treatment Plan Treatment recommendations: Therapy as outlined in treatment  plan below Follow-up recommendations: Follow physicians's recommendations for discharge plan and follow up therapies Functional status assessment: Patient has had a recent decline in their functional status and demonstrates the ability to make significant improvements in function  in a reasonable and predictable amount of time. Treatment frequency: Min 2x/week Treatment duration: 2 weeks Interventions: Aspiration precaution training; Oropharyngeal exercises; Compensatory techniques; Patient/family education; Trials of upgraded texture/liquids; Diet toleration management by SLP Recommendations Recommendations for follow up therapy are one component of a multi-disciplinary discharge planning process, led by the attending physician.  Recommendations may be updated based on patient status, additional functional criteria and insurance authorization. Assessment: Orofacial Exam: Orofacial Exam Oral Cavity: Oral Hygiene: WFL Oral Cavity - Dentition: Missing dentition Orofacial Anatomy: WFL Oral Motor/Sensory Function: -- (unable to assess d/t cognitive deficits) Anatomy: Anatomy: WFL Boluses Administered: Boluses Administered Boluses Administered: Thin liquids (Level 0); Mildly thick liquids (Level 2, nectar thick); Moderately thick liquids (Level 3, honey thick); Puree  Oral Impairment Domain: Oral Impairment Domain Lip Closure: No labial escape Tongue control during bolus hold: -- (unable to complete d/t impaired cognition) Bolus transport/lingual motion: Brisk tongue motion Oral residue: Trace residue lining oral structures Location of oral residue : Tongue; Palate Initiation of pharyngeal swallow : Valleculae  Pharyngeal Impairment Domain: Pharyngeal Impairment Domain Soft palate elevation: No bolus between soft palate (SP)/pharyngeal wall (PW) Laryngeal elevation: Partial superior movement of thyroid  cartilage/partial approximation of arytenoids to epiglottic petiole Anterior hyoid excursion: Partial anterior movement  Epiglottic movement: Partial inversion Laryngeal vestibule closure: Incomplete, narrow column air/contrast in laryngeal vestibule Pharyngeal stripping wave : Present - diminished Pharyngeal contraction (A/P view only): N/A Pharyngoesophageal segment opening: Complete distension and complete duration, no obstruction of flow Tongue base retraction: Narrow column of contrast or air between tongue base and PPW Pharyngeal residue: Collection of residue within or on pharyngeal structures Location of pharyngeal residue: Valleculae  Esophageal Impairment Domain: No data recorded Pill: No data recorded Penetration/Aspiration Scale Score: Penetration/Aspiration Scale Score 1.  Material does not enter airway: Puree 8.  Material enters airway, passes BELOW cords without attempt by patient to eject out (silent aspiration) : Thin liquids (Level 0); Mildly thick liquids (Level 2, nectar thick); Moderately thick liquids (Level 3, honey thick) Compensatory Strategies: Compensatory Strategies Compensatory strategies: No   General Information: Caregiver present: No  Diet Prior to this Study: NPO   Temperature : Normal   Respiratory Status: WFL   Supplemental O2: None (Room air)   History of Recent Intubation: No  Behavior/Cognition: Alert; Doesn't follow directions Self-Feeding Abilities: Dependent for feeding Baseline vocal quality/speech: Hypophonia/low volume Volitional Cough: Unable to elicit Volitional Swallow: Unable to elicit Exam Limitations: Poor positioning; Limited visibility Goal Planning: Prognosis for improved oropharyngeal function: Fair Barriers to Reach Goals: Cognitive deficits; Severity of deficits No data recorded Patient/Family Stated Goal: continue to progress Consulted and agree with results and recommendations: Patient Pain: Pain Assessment Pain Assessment: No/denies pain Facial Expression: 0 Body Movements: 0 Muscle Tension: 0 Compliance with ventilator (intubated pts.): N/A Vocalization (extubated pts.): 0  CPOT Total: 0 Pain Intervention(s): Limited activity within patient's tolerance; Monitored during session; Repositioned End of Session: Start Time:SLP Start Time (ACUTE ONLY): 1159 Stop Time: SLP Stop Time (ACUTE ONLY): 1214 Time Calculation:SLP Time Calculation (min) (ACUTE ONLY): 15 min Charges: SLP Evaluations $ SLP Speech Visit: 1 Visit SLP Evaluations $MBS Swallow: 1 Procedure $Swallowing Treatment: 1 Procedure SLP visit diagnosis: SLP Visit Diagnosis: Dysphagia, pharyngeal phase (R13.13) Past Medical History: Past Medical History: Diagnosis Date  Allergy   Alopecia   Anal cancer (HCC) 08/14/2013  invasive squamous cell ca, s/p radiation 10/20-11/26/14 60.4Gy/80fx and chemo  Anxiety   Aortoiliac occlusive disease (HCC) 08/14/2017  Arthritis   B12 deficiency anemia 09/14/2015  Blood transfusion without reported diagnosis   BPPV (benign paroxysmal positional vertigo) 07/08/2015  Cardiomyopathy (HCC) 11/03/2017  Chronic back pain   Chronic daily headache 03/29/2013  takes bc powder  Closed right hip fracture (HCC) 09/10/2015  Coronary artery disease involving native coronary artery of native heart without angina pectoris 10/13/2017  DES to mid RCA  Depression   Essential (hemorrhagic) thrombocythemia (HCC) 09/06/2018  Essential hypertension 09/25/2023  Family history of early CAD 04/08/2016  GERD (gastroesophageal reflux disease)   History of hiatal hernia   Hot flashes   Hyperlipidemia   Hyperlipidemia associated with type 2 diabetes mellitus (HCC) 05/11/2017  Hypothyroidism 09/10/2015  IBS (irritable bowel syndrome) 03/29/2013  Neck pain 07/08/2015  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  takes neurotin  Peripheral vascular disease 11/03/2017  QT prolongation   Sacroiliitis 09/06/2018  Spasms of the hands or feet 10/08/2017  Syncope 06/07/2016  Tubular adenoma of colon 09/08/2003  Vertigo   Wears glasses  Past Surgical History: Past Surgical History: Procedure Laterality Date  ABDOMINAL AORTOGRAM N/A 08/02/2017  Procedure:  ABDOMINAL AORTOGRAM;  Surgeon: Darron Deatrice LABOR, MD;  [facility];  Service: Cardiovascular;  Laterality: N/A;  ABDOMINAL AORTOGRAM W/LOWER EXTREMITY Bilateral 09/03/2024  Procedure: ABDOMINAL AORTOGRAM W/LOWER EXTREMITY;  Surgeon: Serene Gaile ORN, MD;  [facility];  Service: Cardiovascular;  Laterality: Bilateral;  AORTA - BILATERAL FEMORAL ARTERY BYPASS GRAFT N/A 08/14/2017  Procedure: AORTA BIFEMORAL BYPASS GRAFT;  Surgeon: Oris Krystal FALCON, MD;  Location: [facility] OR;  Service: Vascular;  Laterality: N/A;  COLONOSCOPY    COLONOSCOPY N/A 08/29/2024  Procedure: COLONOSCOPY;  Surgeon: Wilhelmenia Aloha Raddle., MD;  Location: [facility];  Service: Gastroenterology;  Laterality: N/A;  CORONARY STENT INTERVENTION N/A 10/13/2017  Procedure: CORONARY STENT INTERVENTION;  Surgeon: Mady Bruckner, MD;  [facility];  Service: Cardiovascular;  Laterality: N/A;  dental implant    ECTOPIC PREGNANCY SURGERY    EMBOLECTOMY N/A 08/14/2017  Procedure: EMBOLECTOMY FEMORAL;  Surgeon: Oris Krystal FALCON, MD;  Location: [facility] OR;  Service: Vascular;  Laterality: N/A;  FEMORAL-POPLITEAL BYPASS GRAFT Right 08/14/2017  Procedure: Right Femoral to Above Knee Popliteal Bypass Graft using Non-Reversed Greater Saphenous Vein Graft from Right Leg;  Surgeon: Oris Krystal FALCON, MD;  Location: [facility] OR;  Service: Vascular;  Laterality: Right;  FLEXIBLE SIGMOIDOSCOPY N/A 08/14/2013  Procedure: FLEXIBLE SIGMOIDOSCOPY;  Surgeon: Gwendlyn ONEIDA Buddy, MD;  Location: [facility];  Service: Endoscopy;  Laterality: N/A;  LAPAROTOMY N/A 10/24/2024  Procedure: EXPLORATORY LAPAROTOMY, ABDOMINAL PACKING AND WOUND VAC APPLICATION;  Surgeon: Debby Hila, MD;  Location: [facility];  Service: General;  Laterality: N/A;  (8) EIGHT 18X18 LAP SPONGE PACKED  LAPAROTOMY N/A 10/25/2024  Procedure: LAPAROTOMY, EXPLORATORY;  Surgeon: Rubin Calamity, MD;  Location: [facility];  Service: General;  Laterality: N/A;  REMOVAL OF PACKING  LEFT HEART CATH  AND CORONARY ANGIOGRAPHY N/A 10/13/2017  Procedure: LEFT HEART CATH AND CORONARY ANGIOGRAPHY;  Surgeon: Mady Bruckner, MD;  [facility];  Service: Cardiovascular;  Laterality: N/A;  LOWER EXTREMITY ANGIOGRAPHY Bilateral 08/02/2017  Procedure: Lower Extremity Angiography;  Surgeon: Darron Deatrice LABOR, MD;  Location: [facility] INVASIVE CV LAB;  Service: Cardiovascular;  Laterality: Bilateral;  LOWER EXTREMITY INTERVENTION N/A 09/03/2024  Procedure: LOWER EXTREMITY INTERVENTION;  Surgeon: Serene Gaile ORN, MD;  [facility];  Service: Cardiovascular;  Laterality: N/A;  MULTIPLE TOOTH EXTRACTIONS    PERIPHERAL VASCULAR INTERVENTION  05/22/2018  Procedure: PERIPHERAL VASCULAR INTERVENTION;  Surgeon: Serene Gaile ORN, MD;  Location: [facility]  INVASIVE CV LAB;  Service: Cardiovascular;;  SMA and Celiac  PILONIDAL CYST EXCISION    POLYPECTOMY    THROMBECTOMY FEMORAL ARTERY Right 08/14/2017  Procedure: THROMBECTOMY FEMORAL ARTERY;  Surgeon: Oris Krystal FALCON, MD;  Location: [facility] OR;  Service: Vascular;  Laterality: Right;  TONSILLECTOMY    TOTAL HIP ARTHROPLASTY  09/11/2015  Procedure: TOTAL HIP ARTHROPLASTY;  Surgeon: Evalene JONETTA Chancy, MD;  [facility];  Service: Orthopedics;;  ULTRASOUND GUIDANCE FOR VASCULAR ACCESS  10/13/2017  Procedure: Ultrasound Guidance For Vascular Access;  Surgeon: Mady Bruckner, MD;  [facility];  Service: Cardiovascular;;  VENTRAL HERNIA REPAIR N/A 10/23/2024  Procedure: REPAIR, HERNIA, VENTRAL, LAPAROSCOPIC;  Surgeon: Rubin Calamity, MD;  Location: [facility];  Service: General;  Laterality: N/A;  VISCERAL ANGIOGRAPHY N/A 03/03/2020  Procedure: MESTENRIC ANGIOGRAPHY;  Surgeon: Serene Gaile ORN, MD;  [facility];  Service: Cardiovascular;  Laterality: N/A;  VISCERAL ANGIOGRAPHY N/A 09/03/2024  Procedure: VISCERAL ANGIOGRAPHY;  Surgeon: Serene Gaile ORN, MD;  [facility];  Service: Cardiovascular;  Laterality: N/A;  VISCERAL ARTERY  INTERVENTION N/A 09/03/2024  Procedure: VISCERAL ARTERY INTERVENTION;  Surgeon: Serene Gaile ORN, MD;  [facility];  Service: Cardiovascular;  Laterality: N/A; Rosina DELENA Downy 11/06/2024, 2:59 PM   Anti-infectives: Anti-infectives (From admission, onward)    Start     Dose/Rate Route Frequency Ordered Stop   11/01/24 1200  piperacillin -tazobactam (ZOSYN ) IVPB 3.375 g        3.375 g 12.5 mL/hr over 240 Minutes Intravenous Every 8 hours 11/01/24 0822 11/04/24 0048   10/30/24 1400  piperacillin -tazobactam (ZOSYN ) IVPB 2.25 g  Status:  Discontinued        2.25 g 100 mL/hr over 30 Minutes Intravenous Every 8 hours 10/30/24 0939 11/01/24 0822   10/28/24 1600  ceFEPIme  (MAXIPIME ) 2 g in sodium chloride  0.9 % 100 mL IVPB  Status:  Discontinued        2 g 200 mL/hr over 30 Minutes Intravenous Every 24 hours 10/28/24 1441 10/30/24 0939   10/26/24 1800  cefTRIAXone  (ROCEPHIN ) 2 g in sodium chloride  0.9 % 100 mL IVPB  Status:  Discontinued        2 g 200 mL/hr over 30 Minutes Intravenous Daily-1800 10/26/24 1739 10/28/24 1441   10/23/24 0700  ceFAZolin  (ANCEF ) IVPB 2g/100 mL premix        2 g 200 mL/hr over 30 Minutes Intravenous On call to O.R. 10/23/24 9351 10/23/24 9166       Assessment/Plan: s/p Procedures with comments: LAPAROTOMY, EXPLORATORY (N/A) - REMOVAL OF PACKING s/p POD 13 from lap VHR and reexploration for bleeding, POD 11 from reexploration and abdominal closure -con't Tf -dispo planning in process -likely will need for PEG trauma team following for PEG placement -Will f/u pt sporadically, but surgery team available if needed  LOS: 15 days    Calamity Rubin 11/07/2024

## 2024-11-07 NOTE — Progress Notes (Signed)
 Occupational Therapy Treatment Patient Details Name: Erin [name] MRN: [id number] DOB: 1956/02/14 Today's Date: 11/07/2024   History of present illness 68 y.o. female presents to [facility] hospital on 10/29/2024 as a transfer from [place]. Pt underwent ventral hernia repair on 12/3. Pt required return to OR on 12/4 for ex-lap with finding of abdominal hemorrhage, remaining intubated post-procedure. OR 12/5 for removal of packing and abdominal closure. MRI on 12/8 concerning for multiple embolic infarcts. PMH includes anal cancer, hypothyroidism, GERD, CAD, cardiomyopathy, PVD, HLD, HTN, RLS.   OT comments  Patient received in supine and required total assist to get to EOB. Patient demonstrating posterior leaning on EOB and required mod assist for sitting balance and difficulty holding head up. Patient tolerated ~15 minutes on EOB and performed face washing and lateral leaning. Patient performed squat pivot to move towards [facility] and noticed bed was wet. Patient was able to stand with max assist and knees blocked while linen was changed, 30-45 seconds. Patient was total assist for returning to supine.  Discharge recommendations continue to be appropriate. Acute OT to continue to follow to address established goals to facilitate DC to next venue of care.        If plan is discharge home, recommend the following:  Other (comment) (total care)   Equipment Recommendations  Hospital bed;Hoyer lift;Wheelchair (measurements OT);Wheelchair cushion (measurements OT)    Recommendations for Other Services      Precautions / Restrictions Precautions Precautions: Fall Recall of Precautions/Restrictions: Impaired Precaution/Restrictions Comments: cortrak, rectal pouch Restrictions Weight Bearing Restrictions Per Provider Order: No       Mobility Bed Mobility Overal bed mobility: Needs Assistance Bed Mobility: Supine to Sit, Sit to Supine     Supine to sit: Total assist Sit to supine: Total  assist   General bed mobility comments: patient instructed to assist with bed mobility but no active participation    Transfers Overall transfer level: Needs assistance Equipment used: None Transfers: Sit to/from Stand Sit to Stand: Max assist   Squat pivot transfers: Max assist       General transfer comment: squat pivot towards HOB before attempting to return to supine and bed was wet. Patient stood with max assist to change linen     Balance Overall balance assessment: Needs assistance Sitting-balance support: No upper extremity supported, Feet unsupported Sitting balance-Leahy Scale: Poor Sitting balance - Comments: mod assist for trunk and patient having difficulty holding head up Postural control: Posterior lean Standing balance support: During functional activity Standing balance-Leahy Scale: Zero Standing balance comment: max assist in standing with therapist blocking knees                           ADL either performed or assessed with clinical judgement   ADL Overall ADL's : Needs assistance/impaired     Grooming: Wash/dry face;Moderate assistance;Sitting Grooming Details (indicate cue type and reason): hand over hand to initiate and patient able to sip mouth, unable to wash forehead or eyes                               General ADL Comments: total care    Extremity/Trunk Assessment              Vision       Perception     Praxis     Communication Communication Communication: Impaired Factors Affecting Communication: Difficulty expressing self  Cognition Arousal: Lethargic Behavior During Therapy: Flat affect Cognition: Difficult to assess Difficult to assess due to: Level of arousal           OT - Cognition Comments: little to no verbalization, stated no to standing and when preparing to return to supine softly stated, hurry  up                 Following commands: Impaired Following commands impaired:  Follows one step commands inconsistently, Follows one step commands with increased time      Cueing   Cueing Techniques: Tactile cues, Visual cues, Gestural cues, Verbal cues  Exercises      Shoulder Instructions       General Comments VSS on RA    Pertinent Vitals/ Pain       Pain Assessment Pain Assessment: No/denies pain  Home Living                                          Prior Functioning/Environment              Frequency  Min 2X/week        Progress Toward Goals  OT Goals(current goals can now be found in the care plan section)  Progress towards OT goals: Progressing toward goals     Plan      Co-evaluation                 AM-PAC OT 6 Clicks Daily Activity     Outcome Measure   Help from another person eating meals?: Total Help from another person taking care of personal grooming?: Total Help from another person toileting, which includes using toliet, bedpan, or urinal?: Total Help from another person bathing (including washing, rinsing, drying)?: Total Help from another person to put on and taking off regular upper body clothing?: Total Help from another person to put on and taking off regular lower body clothing?: Total 6 Click Score: 6    End of Session    OT Visit Diagnosis: Muscle weakness (generalized) (M62.81);Other symptoms and signs involving cognitive function;Cognitive communication deficit (R41.841) Symptoms and signs involving cognitive functions: Cerebral infarction   Activity Tolerance Patient limited by lethargy;Patient limited by fatigue   Patient Left in bed;with call bell/phone within reach;with bed alarm set;with family/visitor present   Nurse Communication Mobility status        Time: 8699-8670 OT Time Calculation (min): 29 min  Charges: OT General Charges $OT Visit: 1 Visit OT Treatments $Therapeutic Activity: 23-37 mins  Dick [name], OTA Acute Rehabilitation Services  Office  [phone]   Erin [name] 11/07/2024, 2:15 PM

## 2024-11-08 DIAGNOSIS — Z9889 Other specified postprocedural states: Secondary | ICD-10-CM | POA: Diagnosis not present

## 2024-11-08 DIAGNOSIS — Z8719 Personal history of other diseases of the digestive system: Secondary | ICD-10-CM | POA: Diagnosis not present

## 2024-11-08 LAB — MAGNESIUM: Magnesium: 1.9 mg/dL (ref 1.7–2.4)

## 2024-11-08 LAB — GLUCOSE, CAPILLARY
Glucose-Capillary: 103 mg/dL — ABNORMAL HIGH (ref 70–99)
Glucose-Capillary: 111 mg/dL — ABNORMAL HIGH (ref 70–99)
Glucose-Capillary: 112 mg/dL — ABNORMAL HIGH (ref 70–99)
Glucose-Capillary: 119 mg/dL — ABNORMAL HIGH (ref 70–99)
Glucose-Capillary: 128 mg/dL — ABNORMAL HIGH (ref 70–99)
Glucose-Capillary: 99 mg/dL (ref 70–99)

## 2024-11-08 LAB — CBC WITH DIFFERENTIAL/PLATELET
Abs Immature Granulocytes: 0.14 K/uL — ABNORMAL HIGH (ref 0.00–0.07)
Basophils Absolute: 0 K/uL (ref 0.0–0.1)
Basophils Relative: 0 %
Eosinophils Absolute: 0.1 K/uL (ref 0.0–0.5)
Eosinophils Relative: 1 %
HCT: 26 % — ABNORMAL LOW (ref 36.0–46.0)
Hemoglobin: 8.6 g/dL — ABNORMAL LOW (ref 12.0–15.0)
Immature Granulocytes: 1 %
Lymphocytes Relative: 10 %
Lymphs Abs: 1 K/uL (ref 0.7–4.0)
MCH: 31.5 pg (ref 26.0–34.0)
MCHC: 33.1 g/dL (ref 30.0–36.0)
MCV: 95.2 fL (ref 80.0–100.0)
Monocytes Absolute: 0.9 K/uL (ref 0.1–1.0)
Monocytes Relative: 9 %
Neutro Abs: 8.6 K/uL — ABNORMAL HIGH (ref 1.7–7.7)
Neutrophils Relative %: 79 %
Platelets: 631 K/uL — ABNORMAL HIGH (ref 150–400)
RBC: 2.73 MIL/uL — ABNORMAL LOW (ref 3.87–5.11)
RDW: 16.1 % — ABNORMAL HIGH (ref 11.5–15.5)
WBC: 10.7 K/uL — ABNORMAL HIGH (ref 4.0–10.5)
nRBC: 0 % (ref 0.0–0.2)

## 2024-11-08 LAB — COMPREHENSIVE METABOLIC PANEL WITH GFR
ALT: 11 U/L (ref 0–44)
AST: 19 U/L (ref 15–41)
Albumin: 3.1 g/dL — ABNORMAL LOW (ref 3.5–5.0)
Alkaline Phosphatase: 87 U/L (ref 38–126)
Anion gap: 12 (ref 5–15)
BUN: 38 mg/dL — ABNORMAL HIGH (ref 8–23)
CO2: 18 mmol/L — ABNORMAL LOW (ref 22–32)
Calcium: 8.8 mg/dL — ABNORMAL LOW (ref 8.9–10.3)
Chloride: 106 mmol/L (ref 98–111)
Creatinine, Ser: 1.28 mg/dL — ABNORMAL HIGH (ref 0.44–1.00)
GFR, Estimated: 45 mL/min — ABNORMAL LOW
Glucose, Bld: 105 mg/dL — ABNORMAL HIGH (ref 70–99)
Potassium: 4 mmol/L (ref 3.5–5.1)
Sodium: 136 mmol/L (ref 135–145)
Total Bilirubin: <0.2 mg/dL (ref 0.0–1.2)
Total Protein: 5.7 g/dL — ABNORMAL LOW (ref 6.5–8.1)

## 2024-11-08 LAB — PHOSPHORUS: Phosphorus: 4.2 mg/dL (ref 2.5–4.6)

## 2024-11-08 MED ORDER — GABAPENTIN 250 MG/5ML PO SOLN
600.0000 mg | Freq: Three times a day (TID) | ORAL | Status: DC
  Filled 2024-11-08 (×2): qty 12

## 2024-11-08 MED ORDER — HYDRALAZINE HCL 50 MG PO TABS
100.0000 mg | ORAL_TABLET | Freq: Three times a day (TID) | ORAL | Status: DC
  Administered 2024-11-08 – 2024-11-09 (×3): 100 mg
  Filled 2024-11-08 (×4): qty 2

## 2024-11-08 MED ORDER — GABAPENTIN 250 MG/5ML PO SOLN
400.0000 mg | Freq: Three times a day (TID) | ORAL | Status: DC
  Administered 2024-11-08 – 2024-11-09 (×2): 400 mg
  Filled 2024-11-08 (×3): qty 8

## 2024-11-08 NOTE — Progress Notes (Signed)
 Speech Language Pathology Treatment: Dysphagia;Cognitive-Linguistic  Patient Details Name: Erin [name] MRN: [id number] DOB: 02/25/1956 Today's Date: 11/08/2024 Time: 1000-1038 SLP Time Calculation (min) (ACUTE ONLY): 38 min  Assessment / Plan / Recommendation Clinical Impression  Pt seen at bedside for skilled ST intervention targeting goals for PO readiness and communication. Pt's husband was present during this session. Pt was noted to be awake upon arrival. Cortrak in place.   DYSPHAGIA: Oral care was completed with suction. Pt required encouragement by SLP and spouse to allow oral care. Moderate multimodal cues required for full participation. Pt accepted individual small ice chips x3. Extended oral prep, but good oral control without anterior leakage observed. Swallow reflex was appreciated to palpation. Delayed cough after 3rd presentation of ice chips. Pt declined further PO trials. Pt's husband asked if MBS could be repeated given improvement in alertness, and asked if the Cortrak could be removed prior to the study. SLP provided education regarding previous MBS results, risk of having to re-insert Cortrak, and need for pt to be able to meet nutrition/hydration needs PO. At this time, repeat MBS is not recommended, given bedside presentation. Will continue to assess readiness to repeat study.  COM/COG: Pt with minimal verbalizations today (yeah, nuh-uh). Whisper level voicing, but clear (not wet). Pt unable to follow verbal directions for me, however, husband reports she was able to squeeze his hand to command earlier today. SLP and spouse encouraged pt to continue to participate in therapies for improvement in swallow function/safety, removal of Cortrak, and return to PO intake, as well as effective communication of wants/needs. ST will continue to follow per current POC.    HPI HPI: 68 yo F adm to [facility] 12/3 for ventral hernia repair. 12/4 ex lap with abd hemorrhage, remained intubated  post op. 12/5 abdominal closure. 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 12/9 transfer to [facility]. 12/11 extubated. BSE 11/01/24: NPO, ice chips after oral care. MBS 12/17: silent aspiration, DNFC; NPO. Significant issues with attn/cog. SLP following for cognitive-linguistic deficits and dysphagia. PMHx: anal CA, hypothyroidism, GERD, CAD, cardiomyopathy, PVD, HLD, HTN, RLS. Per 2023 MBS, mild pharyngeal dysphagia and suspected primary esophageal dysphagia.      SLP Plan  Continue with current plan of care        Swallow Evaluation Recommendations    Continue NPO, OK to have ice chips after oral care.     Recommendations  Diet recommendations: NPO;Other(comment) (ice chips after oral care) Liquids provided via: Teaspoon Medication Administration: Via alternative means Supervision: Full supervision/cueing for compensatory strategies;Trained caregiver to feed patient Compensations: Slow rate;Minimize environmental distractions Postural Changes and/or Swallow Maneuvers: Seated upright 90 degrees;Upright 30-60 min after meal           Oral care QID;Oral care prior to ice chip/H20   Frequent or constant Supervision/Assistance Dysphagia, pharyngeal phase (R13.13);Cognitive communication deficit (R41.841)     Continue with current plan of care    Janele Lague B. Dory, MSP, CCC-SLP Speech Language Pathologist Office: [phone]  Dory Caprice Daring 11/08/2024, 10:52 AM

## 2024-11-08 NOTE — Progress Notes (Signed)
 Physical Therapy Treatment Patient Details Name: Erin [name] MRN: [id number] DOB: 12/22/1955 Today's Date: 11/08/2024   History of Present Illness 68 y.o. female presents to [facility] hospital on 10/29/2024 as a transfer from [place]. Pt underwent ventral hernia repair on 12/3. Pt required return to OR on 12/4 for ex-lap with finding of abdominal hemorrhage, remaining intubated post-procedure. OR 12/5 for removal of packing and abdominal closure. MRI on 12/8 concerning for multiple embolic infarcts. PMH includes anal cancer, hypothyroidism, GERD, CAD, cardiomyopathy, PVD, HLD, HTN, RLS.    PT Comments  Pt received in supine and agreeable to session with husband present. Pt able to respond with head nods and a few verbalizations this session, such as stating I need to lay down. Pt requires max A to sit to EOB, but is able to maintain sitting balance with light min A. Pt attempts to scoot back using BUE on EOB and initiates return to supine. Pt able to remain sitting EOB for a few mins with encouragement, but demonstrates quick fatigue. Attempted to facilitate BLE exercise, but pt does not participate and initiates laying on her side and elevating BLE back into bed. Pt's husband reports she reached to rails to reposition and lifted her bottom to scratch her back earlier today. Pt continues to benefit from PT services to progress toward functional mobility goals.    If plan is discharge home, recommend the following: Two people to help with walking and/or transfers;Two people to help with bathing/dressing/bathroom;Assistance with cooking/housework;Assistance with feeding;Direct supervision/assist for medications management;Direct supervision/assist for financial management;Assist for transportation;Help with stairs or ramp for entrance;Supervision due to cognitive status   Can travel by private vehicle        Equipment Recommendations  Crescent lift;Hospital bed;Wheelchair (measurements PT);Wheelchair  cushion (measurements PT)    Recommendations for Other Services       Precautions / Restrictions Precautions Precautions: Fall Recall of Precautions/Restrictions: Impaired Precaution/Restrictions Comments: cortrak Restrictions Weight Bearing Restrictions Per Provider Order: No     Mobility  Bed Mobility Overal bed mobility: Needs Assistance Bed Mobility: Rolling, Sidelying to Sit, Sit to Sidelying Rolling: Max assist Sidelying to sit: Max assist, Used rails     Sit to sidelying: Min assist General bed mobility comments: Pt flexed B knees and requires assist to roll to side and reach for rail with RUE. Pt initially let go of rail, but able to grip with cues. Assist to bring BLE off bed and elevate trunk. Pt initiated return to supine and was able to bring BLE back to EOB with minA    Transfers                   General transfer comment: nt. pt returned to supine due to fatigue    Ambulation/Gait                   Stairs             Wheelchair Mobility     Tilt Bed    Modified Rankin (Stroke Patients Only) Modified Rankin (Stroke Patients Only) Pre-Morbid Rankin Score: No symptoms Modified Rankin: Severe disability     Balance Overall balance assessment: Needs assistance Sitting-balance support: No upper extremity supported, Feet unsupported Sitting balance-Leahy Scale: Poor Sitting balance - Comments: L lateral lean, but pt able to maintain balance with light min A.  Communication Communication Communication: Impaired Factors Affecting Communication: Difficulty expressing self  Cognition Arousal: Alert Behavior During Therapy: Flat affect   PT - Cognitive impairments: Initiation, Sequencing, Awareness, Problem solving, Difficult to assess                       PT - Cognition Comments: Pt attempted some verbal communication this session and stated that she wanted to lay down.  pt nodded to answer questions appropriately. Pt followed some commands. Following commands: Impaired Following commands impaired: Follows one step commands inconsistently, Follows one step commands with increased time    Cueing Cueing Techniques: Tactile cues, Visual cues, Gestural cues, Verbal cues  Exercises      General Comments        Pertinent Vitals/Pain Pain Assessment Pain Assessment: Faces Faces Pain Scale: Hurts a little bit Pain Location: abdomen Pain Descriptors / Indicators: Discomfort, Guarding Pain Intervention(s): Limited activity within patient's tolerance, Monitored during session, Repositioned     PT Goals (current goals can now be found in the care plan section) Acute Rehab PT Goals Patient Stated Goal: to improve mobility quality and reduce caregiver burden PT Goal Formulation: With family Time For Goal Achievement: 11/13/24 Progress towards PT goals: Progressing toward goals    Frequency    Min 2X/week       AM-PAC PT 6 Clicks Mobility   Outcome Measure  Help needed turning from your back to your side while in a flat bed without using bedrails?: A Lot Help needed moving from lying on your back to sitting on the side of a flat bed without using bedrails?: A Lot Help needed moving to and from a bed to a chair (including a wheelchair)?: Total Help needed standing up from a chair using your arms (e.g., wheelchair or bedside chair)?: Total Help needed to walk in hospital room?: Total Help needed climbing 3-5 steps with a railing? : Total 6 Click Score: 8    End of Session   Activity Tolerance: Patient limited by fatigue Patient left: in bed;with call bell/phone within reach;with bed alarm set;with family/visitor present Nurse Communication: Mobility status PT Visit Diagnosis: Other abnormalities of gait and mobility (R26.89);Other symptoms and signs involving the nervous system (R29.898);Hemiplegia and hemiparesis Hemiplegia - Right/Left:  Left Hemiplegia - dominant/non-dominant: Non-dominant     Time: 1407-1430 PT Time Calculation (min) (ACUTE ONLY): 23 min  Charges:    $Therapeutic Activity: 23-37 mins PT General Charges $$ ACUTE PT VISIT: 1 Visit                    Darryle George, PTA Acute Rehabilitation Services Secure Chat Preferred  [phone]    Darryle George 11/08/2024, 3:52 PM

## 2024-11-08 NOTE — TOC Progression Note (Signed)
 Transition of Care [facility]) - Progression Note    Patient Details  Name: Erin [name] MRN: [id number] Date of Birth: December 18, 1955  [facility] CM/SW Contact  Inocente GORMAN Kindle, LCSW Phone Number: 11/08/2024, 1:18 PM  Clinical Narrative:    CSW inquired for an update on insurance from [employer]. Liaison reported that prior case manager was aware that auth had been denied and spouse has elected to take the patient home after PEG placement. Will update RNCM.    Expected Discharge Plan: Home w Home Health Services Barriers to Discharge: Continued Medical Work up               Expected Discharge Plan and Services   Discharge Planning Services: CM Consult Post Acute Care Choice: Long Term Acute Care (LTAC) Living arrangements for the past 2 months: Single Family Home Expected Discharge Date: 10/23/24               DME Arranged: N/A DME Agency: NA       HH Arranged: NA HH Agency: NA         Social Drivers of Health (SDOH) Interventions SDOH Screenings   Food Insecurity: No Food Insecurity (10/24/2024)  Housing: Low Risk (10/24/2024)  Transportation Needs: No Transportation Needs (10/24/2024)  Utilities: Not At Risk (10/24/2024)  Alcohol Screen: Low Risk (02/26/2024)  Depression (PHQ2-9): Low Risk (02/26/2024)  Financial Resource Strain: Low Risk (02/26/2024)  Physical Activity: Inactive (02/26/2024)  Social Connections: Socially Integrated (10/24/2024)  Stress: No Stress Concern Present (02/26/2024)  Tobacco Use: Medium Risk (10/23/2024)  Health Literacy: Adequate Health Literacy (02/26/2024)    Readmission Risk Interventions     No data to display

## 2024-11-08 NOTE — Progress Notes (Signed)
 14 Days Post-Op   Subjective/Chief Complaint: Pt awake and talking this AM    Objective: Vital signs in last 24 hours: Temp:  [97.3 F (36.3 C)-98 F (36.7 C)] 98 F (36.7 C) (12/19 0733) Pulse Rate:  [76-101] 101 (12/19 0800) Resp:  [18-23] 21 (12/19 0800) BP: (135-172)/(57-99) 163/71 (12/19 0800) SpO2:  [96 %-100 %] 98 % (12/19 0800) Weight:  [48 kg] 48 kg (12/19 0500) Last BM Date : 11/07/24  Intake/Output from previous day: 12/18 0701 - 12/19 0700 In: 900 [NG/GT:900] Out: 2000 [Urine:1700; Stool:300] Intake/Output this shift: Total I/O In: -  Out: 550 [Urine:550]  PE:   Constitutional: No acute distress,  appears states age. Eyes: Anicteric sclerae, moist conjunctiva, no lid lag Lungs: Clear to auscultation bilaterally, normal respiratory effort CV: regular rate and rhythm, no murmurs, no peripheral edema, pedal pulses 2+ GI: Soft, no masses or hepatosplenomegaly, non-tender to palpation Skin: No rashes, palpation reveals normal turgor Neuro: tracks and min interaction, verbalizing  Lab Results:  Recent Labs    11/07/24 0719 11/08/24 0437  WBC 8.4 10.7*  HGB 8.5* 8.6*  HCT 27.1* 26.0*  PLT 609* 631*   BMET Recent Labs    11/07/24 0719 11/08/24 0437  NA 138 136  K 3.9 4.0  CL 108 106  CO2 20* 18*  GLUCOSE 112* 105*  BUN 36* 38*  CREATININE 1.32* 1.28*  CALCIUM  8.8* 8.8*   PT/INR No results for input(s): LABPROT, INR in the last 72 hours. ABG No results for input(s): PHART, HCO3 in the last 72 hours.  Invalid input(s): PCO2, PO2  Studies/Results: DG Chest Port 1 View Result Date: 11/07/2024 EXAM: 1 VIEW(S) XRAY OF THE CHEST 11/07/2024 06:03:23 AM COMPARISON: 11/01/2024 CLINICAL HISTORY: SOB (shortness of breath) FINDINGS: LINES, TUBES AND DEVICES: Enteric tube below diaphragm with distal tip beyond inferior margin of film. Left renal artery stent noted. LUNGS AND PLEURA: Right basilar airspace opacity. Left basilar atelectasis. No  pleural effusion. No pneumothorax. HEART AND MEDIASTINUM: Aortic atherosclerosis. No acute abnormality of the cardiac silhouette. BONES AND SOFT TISSUES: No acute osseous abnormality. IMPRESSION: 1. Right basilar airspace opacity and left basilar atelectasis. Electronically signed by: Waddell Calk MD 11/07/2024 06:35 AM EST RP Workstation: [id number]   DG Swallowing Func-Speech Pathology Result Date: 11/06/2024 Table formatting from the original result was not included. Modified Barium Swallow Study Patient Details Name: Erin [name] MRN: [id number] Date of Birth: [birth year] Today's Date: 11/06/2024 HPI/PMH: HPI: Erin [name] is a 69 y.o. F  adm to [facility] on 12/3 for ventral hernia repair. 12/4 ex lap with abdominal hemorrhage and remained intubated post op. 12/5 abdominal closure. 12/8 MRI with multiple embolic infarcts. 12/9 transfer to [facility]. 12/11 extubated. PMHx: anal CA, hypothyroidism, GERD, CAD, cardiomyopathy, PVD, HLD, HTN, RLS. SLP consulted for cogntitive-linguistic assessment and clinical swallow assessment. Pt with a hx of mild pharyngeal dysphagia and suspected primary esophageal dysphagia per MBSS completed 05/06/22 Clinical Impression: Clinical Impression: Patient presents with severe pharyngeal dysphagia characterized by impaired effieciency and safety across all administered textures. At baseline, patient has mild pharyngeal dysphagia and suspect given current status, patient is unable to compensate for efficiency deficits resulting in airway compromise. Patient exhibits reduced hyolaryngeal excursion/elevation, epiglottic inversion (with epiglottis also abutting against posterior pharyngeal wall further preventing full inversion), pharyngeal stripping wave, and resultant closure of the laryngeal vestibule. Patient aspirates thin liquids silently during the swallow and mix of residue from thin, NTL, and HTLs after the swallow as appreciated by movement of residue  material into the airway  between images. Given current reduced cognitive status, patient is unable to follow commands adequately to trial any compensatory strategies. Recommend NPO at this time with medications adminsitere via alternative means. SLP will continue to follow. Factors that may increase risk of adverse event in presence of aspiration Noe & Lianne 2021): Factors that may increase risk of adverse event in presence of aspiration Noe & Lianne 2021): Weak cough; Reduced cognitive function; Poor general health and/or compromised immunity Recommendations/Plan: Swallowing Evaluation Recommendations Swallowing Evaluation Recommendations Recommendations: NPO Medication Administration: Via alternative means Oral care recommendations: Oral care QID (4x/day) Caregiver Recommendations: Have oral suction available Treatment Plan Treatment Plan Treatment recommendations: Therapy as outlined in treatment plan below Follow-up recommendations: Follow physicians's recommendations for discharge plan and follow up therapies Functional status assessment: Patient has had a recent decline in their functional status and demonstrates the ability to make significant improvements in function in a reasonable and predictable amount of time. Treatment frequency: Min 2x/week Treatment duration: 2 weeks Interventions: Aspiration precaution training; Oropharyngeal exercises; Compensatory techniques; Patient/family education; Trials of upgraded texture/liquids; Diet toleration management by SLP Recommendations Recommendations for follow up therapy are one component of a multi-disciplinary discharge planning process, led by the attending physician.  Recommendations may be updated based on patient status, additional functional criteria and insurance authorization. Assessment: Orofacial Exam: Orofacial Exam Oral Cavity: Oral Hygiene: WFL Oral Cavity - Dentition: Missing dentition Orofacial Anatomy: WFL Oral Motor/Sensory Function: -- (unable to assess d/t  cognitive deficits) Anatomy: Anatomy: WFL Boluses Administered: Boluses Administered Boluses Administered: Thin liquids (Level 0); Mildly thick liquids (Level 2, nectar thick); Moderately thick liquids (Level 3, honey thick); Puree  Oral Impairment Domain: Oral Impairment Domain Lip Closure: No labial escape Tongue control during bolus hold: -- (unable to complete d/t impaired cognition) Bolus transport/lingual motion: Brisk tongue motion Oral residue: Trace residue lining oral structures Location of oral residue : Tongue; Palate Initiation of pharyngeal swallow : Valleculae  Pharyngeal Impairment Domain: Pharyngeal Impairment Domain Soft palate elevation: No bolus between soft palate (SP)/pharyngeal wall (PW) Laryngeal elevation: Partial superior movement of thyroid  cartilage/partial approximation of arytenoids to epiglottic petiole Anterior hyoid excursion: Partial anterior movement Epiglottic movement: Partial inversion Laryngeal vestibule closure: Incomplete, narrow column air/contrast in laryngeal vestibule Pharyngeal stripping wave : Present - diminished Pharyngeal contraction (A/P view only): N/A Pharyngoesophageal segment opening: Complete distension and complete duration, no obstruction of flow Tongue base retraction: Narrow column of contrast or air between tongue base and PPW Pharyngeal residue: Collection of residue within or on pharyngeal structures Location of pharyngeal residue: Valleculae  Esophageal Impairment Domain: No data recorded Pill: No data recorded Penetration/Aspiration Scale Score: Penetration/Aspiration Scale Score 1.  Material does not enter airway: Puree 8.  Material enters airway, passes BELOW cords without attempt by patient to eject out (silent aspiration) : Thin liquids (Level 0); Mildly thick liquids (Level 2, nectar thick); Moderately thick liquids (Level 3, honey thick) Compensatory Strategies: Compensatory Strategies Compensatory strategies: No   General Information: Caregiver  present: No  Diet Prior to this Study: NPO   Temperature : Normal   Respiratory Status: WFL   Supplemental O2: None (Room air)   History of Recent Intubation: No  Behavior/Cognition: Alert; Doesn't follow directions Self-Feeding Abilities: Dependent for feeding Baseline vocal quality/speech: Hypophonia/low volume Volitional Cough: Unable to elicit Volitional Swallow: Unable to elicit Exam Limitations: Poor positioning; Limited visibility Goal Planning: Prognosis for improved oropharyngeal function: Fair Barriers to Reach Goals: Cognitive deficits; Severity of deficits No data  recorded Patient/Family Stated Goal: continue to progress Consulted and agree with results and recommendations: Patient Pain: Pain Assessment Pain Assessment: No/denies pain Facial Expression: 0 Body Movements: 0 Muscle Tension: 0 Compliance with ventilator (intubated pts.): N/A Vocalization (extubated pts.): 0 CPOT Total: 0 Pain Intervention(s): Limited activity within patient's tolerance; Monitored during session; Repositioned End of Session: Start Time:SLP Start Time (ACUTE ONLY): 1159 Stop Time: SLP Stop Time (ACUTE ONLY): 1214 Time Calculation:SLP Time Calculation (min) (ACUTE ONLY): 15 min Charges: SLP Evaluations $ SLP Speech Visit: 1 Visit SLP Evaluations $MBS Swallow: 1 Procedure $Swallowing Treatment: 1 Procedure SLP visit diagnosis: SLP Visit Diagnosis: Dysphagia, pharyngeal phase (R13.13) Past Medical History: Past Medical History: Diagnosis Date  Allergy   Alopecia   Anal cancer (HCC) 08/14/2013  invasive squamous cell ca, s/p radiation 10/20-11/26/14 60.4Gy/74fx and chemo  Anxiety   Aortoiliac occlusive disease (HCC) 08/14/2017  Arthritis   B12 deficiency anemia 09/14/2015  Blood transfusion without reported diagnosis   BPPV (benign paroxysmal positional vertigo) 07/08/2015  Cardiomyopathy (HCC) 11/03/2017  Chronic back pain   Chronic daily headache 03/29/2013  takes bc powder  Closed right hip fracture (HCC) 09/10/2015  Coronary  artery disease involving native coronary artery of native heart without angina pectoris 10/13/2017  DES to mid RCA  Depression   Essential (hemorrhagic) thrombocythemia (HCC) 09/06/2018  Essential hypertension 09/25/2023  Family history of early CAD 04/08/2016  GERD (gastroesophageal reflux disease)   History of hiatal hernia   Hot flashes   Hyperlipidemia   Hyperlipidemia associated with type 2 diabetes mellitus (HCC) 05/11/2017  Hypothyroidism 09/10/2015  IBS (irritable bowel syndrome) 03/29/2013  Neck pain 07/08/2015  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  takes neurotin  Peripheral vascular disease 11/03/2017  QT prolongation   Sacroiliitis 09/06/2018  Spasms of the hands or feet 10/08/2017  Syncope 06/07/2016  Tubular adenoma of colon 09/08/2003  Vertigo   Wears glasses  Past Surgical History: Past Surgical History: Procedure Laterality Date  ABDOMINAL AORTOGRAM N/A 08/02/2017  Procedure: ABDOMINAL AORTOGRAM;  Surgeon: Darron Deatrice LABOR, MD;  Location: [facility];  Service: Cardiovascular;  Laterality: N/A;  ABDOMINAL AORTOGRAM W/LOWER EXTREMITY Bilateral 09/03/2024  Procedure: ABDOMINAL AORTOGRAM W/LOWER EXTREMITY;  Surgeon: Serene Gaile ORN, MD;  Location: [facility];  Service: Cardiovascular;  Laterality: Bilateral;  AORTA - BILATERAL FEMORAL ARTERY BYPASS GRAFT N/A 08/14/2017  Procedure: AORTA BIFEMORAL BYPASS GRAFT;  Surgeon: Oris Krystal FALCON, MD;  Location: [facility] OR;  Service: Vascular;  Laterality: N/A;  COLONOSCOPY    COLONOSCOPY N/A 08/29/2024  Procedure: COLONOSCOPY;  Surgeon: Wilhelmenia Aloha Raddle., MD;  Location: [facility];  Service: Gastroenterology;  Laterality: N/A;  CORONARY STENT INTERVENTION N/A 10/13/2017  Procedure: CORONARY STENT INTERVENTION;  Surgeon: Mady Bruckner, MD;  Location: [facility];  Service: Cardiovascular;  Laterality: N/A;  dental implant    ECTOPIC PREGNANCY SURGERY    EMBOLECTOMY N/A 08/14/2017  Procedure: EMBOLECTOMY FEMORAL;  Surgeon: Oris Krystal FALCON, MD;   Location: [facility] OR;  Service: Vascular;  Laterality: N/A;  FEMORAL-POPLITEAL BYPASS GRAFT Right 08/14/2017  Procedure: Right Femoral to Above Knee Popliteal Bypass Graft using Non-Reversed Greater Saphenous Vein Graft from Right Leg;  Surgeon: Oris Krystal FALCON, MD;  Location: [facility] OR;  Service: Vascular;  Laterality: Right;  FLEXIBLE SIGMOIDOSCOPY N/A 08/14/2013  Procedure: FLEXIBLE SIGMOIDOSCOPY;  Surgeon: Gwendlyn ONEIDA Buddy, MD;  Location: [facility];  Service: Endoscopy;  Laterality: N/A;  LAPAROTOMY N/A 10/24/2024  Procedure: EXPLORATORY LAPAROTOMY, ABDOMINAL PACKING AND WOUND VAC APPLICATION;  Surgeon: Debby Hila, MD;  Location: [facility];  Service: General;  Laterality: N/A;  (8) EIGHT 18X18 LAP SPONGE PACKED  LAPAROTOMY N/A 10/25/2024  Procedure: LAPAROTOMY, EXPLORATORY;  Surgeon: Rubin Calamity, MD;  Location: [facility];  Service: General;  Laterality: N/A;  REMOVAL OF PACKING  LEFT HEART CATH AND CORONARY ANGIOGRAPHY N/A 10/13/2017  Procedure: LEFT HEART CATH AND CORONARY ANGIOGRAPHY;  Surgeon: Mady Bruckner, MD;  Location: [facility];  Service: Cardiovascular;  Laterality: N/A;  LOWER EXTREMITY ANGIOGRAPHY Bilateral 08/02/2017  Procedure: Lower Extremity Angiography;  Surgeon: Darron Deatrice LABOR, MD;  Location: [facility];  Service: Cardiovascular;  Laterality: Bilateral;  LOWER EXTREMITY INTERVENTION N/A 09/03/2024  Procedure: LOWER EXTREMITY INTERVENTION;  Surgeon: Serene Gaile ORN, MD;  Location: [facility];  Service: Cardiovascular;  Laterality: N/A;  MULTIPLE TOOTH EXTRACTIONS    PERIPHERAL VASCULAR INTERVENTION  05/22/2018  Procedure: PERIPHERAL VASCULAR INTERVENTION;  Surgeon: Serene Gaile ORN, MD;  Location: [facility];  Service: Cardiovascular;;  SMA and Celiac  PILONIDAL CYST EXCISION    POLYPECTOMY    THROMBECTOMY FEMORAL ARTERY Right 08/14/2017  Procedure: THROMBECTOMY FEMORAL ARTERY;  Surgeon: Oris Krystal FALCON, MD;  Location: [facility] OR;  Service: Vascular;  Laterality: Right;   TONSILLECTOMY    TOTAL HIP ARTHROPLASTY  09/11/2015  Procedure: TOTAL HIP ARTHROPLASTY;  Surgeon: Evalene JONETTA Chancy, MD;  Location: [facility];  Service: Orthopedics;;  ULTRASOUND GUIDANCE FOR VASCULAR ACCESS  10/13/2017  Procedure: Ultrasound Guidance For Vascular Access;  Surgeon: Mady Bruckner, MD;  Location: [facility];  Service: Cardiovascular;;  VENTRAL HERNIA REPAIR N/A 10/23/2024  Procedure: REPAIR, HERNIA, VENTRAL, LAPAROSCOPIC;  Surgeon: Rubin Calamity, MD;  Location: [facility];  Service: General;  Laterality: N/A;  VISCERAL ANGIOGRAPHY N/A 03/03/2020  Procedure: MESTENRIC ANGIOGRAPHY;  Surgeon: Serene Gaile ORN, MD;  Location: [facility];  Service: Cardiovascular;  Laterality: N/A;  VISCERAL ANGIOGRAPHY N/A 09/03/2024  Procedure: VISCERAL ANGIOGRAPHY;  Surgeon: Serene Gaile ORN, MD;  Location: [facility];  Service: Cardiovascular;  Laterality: N/A;  VISCERAL ARTERY INTERVENTION N/A 09/03/2024  Procedure: VISCERAL ARTERY INTERVENTION;  Surgeon: Serene Gaile ORN, MD;  Location: [facility];  Service: Cardiovascular;  Laterality: N/A; Rosina LABOR Downy 11/06/2024, 2:59 PM   Anti-infectives: Anti-infectives (From admission, onward)    Start     Dose/Rate Route Frequency Ordered Stop   11/01/24 1200  piperacillin -tazobactam (ZOSYN ) IVPB 3.375 g        3.375 g 12.5 mL/hr over 240 Minutes Intravenous Every 8 hours 11/01/24 0822 11/04/24 0048   10/30/24 1400  piperacillin -tazobactam (ZOSYN ) IVPB 2.25 g  Status:  Discontinued        2.25 g 100 mL/hr over 30 Minutes Intravenous Every 8 hours 10/30/24 0939 11/01/24 0822   10/28/24 1600  ceFEPIme  (MAXIPIME ) 2 g in sodium chloride  0.9 % 100 mL IVPB  Status:  Discontinued        2 g 200 mL/hr over 30 Minutes Intravenous Every 24 hours 10/28/24 1441 10/30/24 0939   10/26/24 1800  cefTRIAXone  (ROCEPHIN ) 2 g in sodium chloride  0.9 % 100 mL IVPB  Status:  Discontinued        2 g 200 mL/hr over 30 Minutes Intravenous Daily-1800  10/26/24 1739 10/28/24 1441   10/23/24 0700  ceFAZolin  (ANCEF ) IVPB 2g/100 mL premix        2 g 200 mL/hr over 30 Minutes Intravenous On call to O.R. 10/23/24 9351 10/23/24 9166       Assessment/Plan: s/p Procedures with comments: LAPAROTOMY, EXPLORATORY (  N/A) - REMOVAL OF PACKING s/p POD 15 from lap VHR and reexploration for bleeding, POD 13 from reexploration and abdominal closure -con't Tf -dispo planning in process -likely will need for PEG trauma team following for PEG placement -Will f/u pt sporadically, but surgery team available if needed  LOS: 16 days    Lynda Leos 11/08/2024

## 2024-11-08 NOTE — Progress Notes (Signed)
 " PROGRESS NOTE    Erin [name]  FMW:[id number] DOB: 28-Jul-1956 DOA: 10/23/2024 PCP: [name] [name], MD   Brief Narrative:  68 year old woman who presented to [facility] 12/3 for elective hernia repair. 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  Patient admitted initially for postop hernia repair found to have hemoperitoneum with hemorrhagic shock postoperatively requiring intubation and pressors.  Her course was subsequently complicated by bilateral strokes thought to be provoked by hypotension.  At this time patient is stabilizing, she has been extubated, continues tube feeds via NG and is otherwise stable from a surgical standpoint.  Transition out of the ICU to hospitalist team on 12/15 with plan for ongoing care with ultimate plan for disposition pending patient's clinical course.  Hospital course 12/3 admitted to ICU postop 12/3-post hernia repair 12/4 taken back to the OR for hemoperitoneum-active bleed was not identified, abdomen packed with wound VAC in place 12/5 back to the OR for an open abdomen-no evidence of ongoing bleeding and abdomen was closed 12/8 - CT Head with interval development (since 04/2024) of multiple cortical and subcortical hypodensities in bilateral cerebellar and cerebral hemispheres suggestive of acute/subacute infarcts. MRI Brain with numerous areas of restricted diffusion throughout the cerebral hemispheres/cerebellum c/f acute infarcts of embolic etiology, associated mild edema (R occipital lobe, L temporal lobe).  12/11 tolerating PSV 5/5 >> extubated 12/15 transition to TRH team - goals of care discussion with husband(see below) 12/17 General surgery evaluating for PEG -pending SLP re-evaluation in hopes she will be able to  improve -LTAC was denied by insurance  Assessment & Plan:    Hemorrhagic shock from hypovolemia caused during ex lap hernia repair SHOCK - required stay in ICU, pressors, received massive blood transfusion protocol with 5 units of packed RBC and 2 units of cryoprecipitate around 10/23/2024, unfortunately incurred anoxic brain injury/CVA, brief support with intubation.  Shock has resolved, monitor with supportive care.  Acute bilateral cerebral and cerebellar infarcts, causing encephalopathy, aphasia, dysphagia - CT head with multiple cortical and subcortical hypodensities, confirmed by MRI, likely due to profound hypotension from #1 above, seen by neuro, patient remains minimally verbal, has dysphagia requiring NG tube feeds, may require peg tube, speech PT OT working.  Currently on aspirin .  Seen by stroke team.  Continue aspirin , stable A1c and LDL.  Stable EEG.  Echocardiogram with preserved EF of 60%.  MRI stable.    HTN  - Continue Amlodipine , Carvedilol , added hydralazine  for better blood pressure control, continue as needed IV hydralazine .    S/p hernia repair, elective 12/3, S/p ex-lap 12/4, 12/5  - Postoperative management per CCS,  Wound care per surgery   Acute kidney injury to ATN from shock.  History of left renal artery stenosis.  Renal function improving.  Monitor   Acute respiratory insufficiency , extubated 12/11 - happened due to anoxic brain injury/CVA.  Resolved.  HCAP -Enterobacter 12/6  - Completed antibiotic treatment.    DVT prophylaxis: heparin  injection 5,000 Units Start: 10/28/24 2200 SCD's Start: 10/23/24 1918 Code Status:   Code Status: Full Code Family Communication: Husband at bedside on 11/07/2024, 11/08/2024  Status is: Inpt  Dispo: LTAC  Consultants:  PCCM, General surgery, Neuro  Procedures:  Hernia repair 12/3 and ex-lap 12/5   Antimicrobials:  None(completed prior course)   Subjective: Patient  in bed in no distress remains aphasic from  stroke, unable to answer questions or follow commands reliably, appears to be in no distress    Objective: Vitals:   11/08/24 0500 11/08/24 0510 11/08/24 0733 11/08/24 0800  BP:  (!) 152/80  (!) 163/71  Pulse:    (!) 101  Resp:    (!) 21  Temp:   98 F (36.7 C)   TempSrc:   Oral   SpO2:    98%  Weight: 48 kg     Height:        Intake/Output Summary (Last 24 hours) at 11/08/2024 0906 Last data filed at 11/08/2024 0734 Gross per 24 hour  Intake 900 ml  Output 2150 ml  Net -1250 ml   Filed Weights   11/05/24 0500 11/06/24 0500 11/08/24 0500  Weight: 49.4 kg 48.2 kg 48 kg    Examination:  Awake but remains nonverbal, does open eyes to loud commands from time to time but inconsistent, withdraws to pain, NG tube in place, [place].AT,PERRAL Supple Neck, No JVD,   Symmetrical Chest wall movement, Good air movement bilaterally, CTAB RRR,No Gallops, Rubs or new Murmurs,  +ve B.Sounds, Abd Soft, No tenderness, midline abdominal postop scar stable under bandage No Cyanosis, Clubbing or edema      Data Review:   Patient Lines/Drains/Airways Status     Active Line/Drains/Airways     Name Placement date Placement time Site Days   Peripheral IV 10/23/24 20 G Anterior;Right Forearm 10/23/24  1430  Forearm  16   Peripheral IV 10/23/24 20 G 1.75 Anterior;Left Forearm 10/23/24  2037  Forearm  16   Flatus Tube/Pouch 11/01/24  0115  --  7   External Urinary Catheter 11/01/24  1900  --  7   Small Bore Feeding Tube Left nare Marking at nare/corner of mouth 68 cm 11/01/24  1536  Left nare  7   Wound 10/23/24 0923 Surgical Closed Surgical Incision Abdomen 10/23/24  0923  Abdomen  16   Wound 10/23/24 0923 Surgical Laparoscopic Abdomen 1: Upper;Left 2: Left;Lower 10/23/24  0923  Abdomen  16   Wound 11/01/24 1600 Irritant Contact Dermatitis Buttocks 11/01/24  1600  Buttocks  7             Inpatient Medications  Scheduled Meds:  amLODipine   10 mg Per Tube Daily   aspirin   81 mg Per  Tube Daily   carvedilol   12.5 mg Per Tube BID WC   Chlorhexidine  Gluconate Cloth  6 each Topical Daily   feeding supplement (PROSource TF20)  60 mL Per Tube Daily   fiber  1 packet Per Tube BID   free water   200 mL Per Tube Q8H   Gerhardt's butt cream   Topical BID   heparin  injection (subcutaneous)  5,000 Units Subcutaneous Q8H   hydrALAZINE   50 mg Per Tube Q8H   Influenza vac split trivalent PF  0.5 mL Intramuscular Tomorrow-1000   levothyroxine   75 mcg Per Tube Q0600   melatonin  3 mg Per Tube QHS   methylphenidate   5 mg Oral q morning   mouth rinse  15 mL Mouth Rinse 4 times per day   pantoprazole  (PROTONIX ) IV  40 mg Intravenous QHS   Continuous Infusions:  feeding supplement (KATE FARMS STANDARD 1.4) Liquid     PRN Meds:.acetaminophen , hydrALAZINE , labetalol , methocarbamol , ondansetron  **OR** ondansetron  (ZOFRAN ) IV, mouth rinse, oxyCODONE   DVT Prophylaxis  heparin  injection 5,000 Units Start: 10/28/24 2200 SCD's Start: 10/23/24 1918  Recent Labs  Lab 11/02/24 0237 11/03/24 0438 11/04/24 0436 11/07/24 0719 11/08/24 0437  WBC 13.4* 9.5 10.4 8.4 10.7*  HGB 8.9* 8.7* 8.9* 8.5* 8.6*  HCT 26.7* 27.1* 28.1* 27.1* 26.0*  PLT 310 414* 476* 609* 631*  MCV 93.0 93.8 96.9 96.8 95.2  MCH 31.0 30.1 30.7 30.4 31.5  MCHC 33.3 32.1 31.7 31.4 33.1  RDW 15.6* 15.5 15.7* 16.1* 16.1*  LYMPHSABS  --   --  0.9 0.7 1.0  MONOABS  --   --  1.2* 0.8 0.9  EOSABS  --   --  0.1 0.1 0.1  BASOSABS  --   --  0.0 0.0 0.0    Recent Labs  Lab 11/02/24 0237 11/03/24 0438 11/04/24 0436 11/07/24 0719 11/08/24 0437  NA 137 138 141 138 136  K 3.3* 3.3* 4.3 3.9 4.0  CL 106 108 113* 108 106  CO2 23 20* 19* 20* 18*  ANIONGAP 8 10 9 10 12   GLUCOSE 119* 113* 99 112* 105*  BUN 39* 37* 34* 36* 38*  CREATININE 1.68* 1.57* 1.50* 1.32* 1.28*  AST  --   --   --  19 19  ALT  --   --   --  12 11  ALKPHOS  --   --   --  89 87  BILITOT  --   --   --  <0.2 <0.2  ALBUMIN   --   --   --  3.2*  3.1*  MG 2.1 1.8 1.9 1.7 1.9  PHOS 3.2 3.4 3.5 4.4 4.2  CALCIUM  7.4* 7.8* 8.1* 8.8* 8.8*      Recent Labs  Lab 11/02/24 0237 11/03/24 0438 11/04/24 0436 11/07/24 0719 11/08/24 0437  MG 2.1 1.8 1.9 1.7 1.9  CALCIUM  7.4* 7.8* 8.1* 8.8* 8.8*    --------------------------------------------------------------------------------------------------------------- Lab Results  Component Value Date   CHOL 91 10/29/2024   HDL 12 (L) 10/29/2024   LDLCALC 13 10/29/2024   LDLDIRECT 70.0 04/07/2021   TRIG 331 (H) 10/29/2024   CHOLHDL 7.6 10/29/2024    Lab Results  Component Value Date   HGBA1C 4.9 10/18/2024   No results for input(s): TSH, T4TOTAL, FREET4, T3FREE, THYROIDAB in the last 72 hours. No results for input(s): VITAMINB12, FOLATE, FERRITIN, TIBC, IRON, RETICCTPCT in the last 72 hours. ------------------------------------------------------------------------------------------------------------------ Cardiac Enzymes No results for input(s): CKMB, TROPONINI, MYOGLOBIN in the last 168 hours.  Invalid input(s): CK  Micro Results Recent Results (from the past 240 hours)  Culture, Respiratory w Gram Stain     Status: None   Collection Time: 10/29/24 11:46 AM   Specimen: Tracheal Aspirate; Respiratory  Result Value Ref Range Status   Specimen Description TRACHEAL ASPIRATE  Final   Special Requests NONE  Final   Gram Stain   Final    ABUNDANT WBC PRESENT, PREDOMINANTLY PMN RARE GRAM NEGATIVE RODS RARE GRAM POSITIVE COCCI IN PAIRS    Culture   Final    FEW Normal respiratory flora-no Staph aureus or Pseudomonas seen Performed at [facility], [address] [place], [place] [address]    Report Status 10/31/2024 FINAL  Final  MRSA Next Gen by PCR, Nasal     Status: None   Collection Time: 10/29/24 12:11 PM   Specimen: Nasal Mucosa; Nasal Swab  Result Value Ref Range Status   MRSA by PCR Next Gen NOT DETECTED NOT DETECTED Final    Comment:  (NOTE) The GeneXpert MRSA Assay (FDA approved for NASAL specimens only), is one component of  a comprehensive MRSA colonization surveillance program. It is not intended to diagnose MRSA infection nor to guide or monitor treatment for MRSA infections. Test performance is not FDA approved in patients less than 53 years old. Performed at [facility], [address], [place], [place] [address]   Gastrointestinal Panel by PCR , Stool     Status: None   Collection Time: 11/02/24  4:03 AM   Specimen: Stool  Result Value Ref Range Status   Campylobacter species NOT DETECTED NOT DETECTED Final   Plesimonas shigelloides NOT DETECTED NOT DETECTED Final   Salmonella species NOT DETECTED NOT DETECTED Final   Yersinia enterocolitica NOT DETECTED NOT DETECTED Final   Vibrio species NOT DETECTED NOT DETECTED Final   Vibrio cholerae NOT DETECTED NOT DETECTED Final   Enteroaggregative E coli (EAEC) NOT DETECTED NOT DETECTED Final   Enteropathogenic E coli (EPEC) NOT DETECTED NOT DETECTED Final   Enterotoxigenic E coli (ETEC) NOT DETECTED NOT DETECTED Final   Shiga like toxin producing E coli (STEC) NOT DETECTED NOT DETECTED Final   Shigella/Enteroinvasive E coli (EIEC) NOT DETECTED NOT DETECTED Final   Cryptosporidium NOT DETECTED NOT DETECTED Final   Cyclospora cayetanensis NOT DETECTED NOT DETECTED Final   Entamoeba histolytica NOT DETECTED NOT DETECTED Final   Giardia lamblia NOT DETECTED NOT DETECTED Final   Adenovirus F40/41 NOT DETECTED NOT DETECTED Final   Astrovirus NOT DETECTED NOT DETECTED Final   Norovirus GI/GII NOT DETECTED NOT DETECTED Final   Rotavirus A NOT DETECTED NOT DETECTED Final   Sapovirus (I, II, IV, and V) NOT DETECTED NOT DETECTED Final    Comment: Performed at [facility], [address] [place], [place] [address]  C Difficile Quick Screen (NO PCR Reflex)     Status: None   Collection Time: 11/02/24 12:23 PM   Specimen: STOOL  Result Value Ref Range  Status   C Diff antigen NEGATIVE NEGATIVE Final   C Diff toxin NEGATIVE NEGATIVE Final   C Diff interpretation No C. difficile detected.  Final    Comment: Performed at [facility], [address], [place], [place] [address]    Radiology Reports  DG Chest [place] 1 View Result Date: 11/07/2024 EXAM: 1 VIEW(S) XRAY OF THE CHEST 11/07/2024 06:03:23 AM COMPARISON: 11/01/2024 CLINICAL HISTORY: SOB (shortness of breath) FINDINGS: LINES, TUBES AND DEVICES: Enteric tube below diaphragm with distal tip beyond inferior margin of film. Left renal artery stent noted. LUNGS AND PLEURA: Right basilar airspace opacity. Left basilar atelectasis. No pleural effusion. No pneumothorax. HEART AND MEDIASTINUM: Aortic atherosclerosis. No acute abnormality of the cardiac silhouette. BONES AND SOFT TISSUES: No acute osseous abnormality. IMPRESSION: 1. Right basilar airspace opacity and left basilar atelectasis. Electronically signed by: Waddell Calk MD 11/07/2024 06:35 AM EST RP Workstation: [id number]   DG Swallowing Func-Speech Pathology Result Date: 11/06/2024 Table formatting from the original result was not included. Modified Barium Swallow Study Patient Details Name: Erin [name] MRN: [id number] Date of Birth: 10/22/1956 Today's Date: 11/06/2024 HPI/PMH: HPI: Mrs. Francesconi is a 68 y.o. F  [facility]3 for ventral hernia repair. 12/4 ex lap with abdominal hemorrhage and remained intubated post op. 12/5 abdominal closure. 12/8 MRI with multiple embolic infarcts. 12/9 transfer to [facility]. 12/11 extubated. PMHx: anal CA, hypothyroidism, GERD, CAD, cardiomyopathy, PVD, HLD, HTN, RLS. SLP consulted for cogntitive-linguistic assessment and clinical swallow assessment. Pt with a hx of mild pharyngeal dysphagia and suspected primary esophageal dysphagia per MBSS completed 05/06/22 Clinical  Impression: Clinical Impression: Patient presents with severe pharyngeal dysphagia characterized by impaired effieciency and  safety across all administered textures. At baseline, patient has mild pharyngeal dysphagia and suspect given current status, patient is unable to compensate for efficiency deficits resulting in airway compromise. Patient exhibits reduced hyolaryngeal excursion/elevation, epiglottic inversion (with epiglottis also abutting against posterior pharyngeal wall further preventing full inversion), pharyngeal stripping wave, and resultant closure of the laryngeal vestibule. Patient aspirates thin liquids silently during the swallow and mix of residue from thin, NTL, and HTLs after the swallow as appreciated by movement of residue material into the airway between images. Given current reduced cognitive status, patient is unable to follow commands adequately to trial any compensatory strategies. Recommend NPO at this time with medications adminsitere via alternative means. SLP will continue to follow. Factors that may increase risk of adverse event in presence of aspiration Noe & Lianne 2021): Factors that may increase risk of adverse event in presence of aspiration Noe & Lianne 2021): Weak cough; Reduced cognitive function; Poor general health and/or compromised immunity Recommendations/Plan: Swallowing Evaluation Recommendations Swallowing Evaluation Recommendations Recommendations: NPO Medication Administration: Via alternative means Oral care recommendations: Oral care QID (4x/day) Caregiver Recommendations: Have oral suction available Treatment Plan Treatment Plan Treatment recommendations: Therapy as outlined in treatment plan below Follow-up recommendations: Follow physicians's recommendations for discharge plan and follow up therapies Functional status assessment: Patient has had a recent decline in their functional status and demonstrates the ability to make significant improvements in function in a reasonable and predictable amount of time. Treatment frequency: Min 2x/week Treatment duration: 2 weeks  Interventions: Aspiration precaution training; Oropharyngeal exercises; Compensatory techniques; Patient/family education; Trials of upgraded texture/liquids; Diet toleration management by SLP Recommendations Recommendations for follow up therapy are one component of a multi-disciplinary discharge planning process, led by the attending physician.  Recommendations may be updated based on patient status, additional functional criteria and insurance authorization. Assessment: Orofacial Exam: Orofacial Exam Oral Cavity: Oral Hygiene: WFL Oral Cavity - Dentition: Missing dentition Orofacial Anatomy: WFL Oral Motor/Sensory Function: -- (unable to assess d/t cognitive deficits) Anatomy: Anatomy: WFL Boluses Administered: Boluses Administered Boluses Administered: Thin liquids (Level 0); Mildly thick liquids (Level 2, nectar thick); Moderately thick liquids (Level 3, honey thick); Puree  Oral Impairment Domain: Oral Impairment Domain Lip Closure: No labial escape Tongue control during bolus hold: -- (unable to complete d/t impaired cognition) Bolus transport/lingual motion: Brisk tongue motion Oral residue: Trace residue lining oral structures Location of oral residue : Tongue; Palate Initiation of pharyngeal swallow : Valleculae  Pharyngeal Impairment Domain: Pharyngeal Impairment Domain Soft palate elevation: No bolus between soft palate (SP)/pharyngeal wall (PW) Laryngeal elevation: Partial superior movement of thyroid  cartilage/partial approximation of arytenoids to epiglottic petiole Anterior hyoid excursion: Partial anterior movement Epiglottic movement: Partial inversion Laryngeal vestibule closure: Incomplete, narrow column air/contrast in laryngeal vestibule Pharyngeal stripping wave : Present - diminished Pharyngeal contraction (A/P view only): N/A Pharyngoesophageal segment opening: Complete distension and complete duration, no obstruction of flow Tongue base retraction: Narrow column of contrast or air between  tongue base and PPW Pharyngeal residue: Collection of residue within or on pharyngeal structures Location of pharyngeal residue: Valleculae  Esophageal Impairment Domain: No data recorded Pill: No data recorded Penetration/Aspiration Scale Score: Penetration/Aspiration Scale Score 1.  Material does not enter airway: Puree 8.  Material enters airway, passes BELOW cords without attempt by patient to eject out (silent aspiration) : Thin liquids (Level 0); Mildly thick liquids (Level 2, nectar thick); Moderately thick liquids (Level 3,  honey thick) Compensatory Strategies: Compensatory Strategies Compensatory strategies: No   General Information: Caregiver present: No  Diet Prior to this Study: NPO   Temperature : Normal   Respiratory Status: WFL   Supplemental O2: None (Room air)   History of Recent Intubation: No  Behavior/Cognition: Alert; Doesn't follow directions Self-Feeding Abilities: Dependent for feeding Baseline vocal quality/speech: Hypophonia/low volume Volitional Cough: Unable to elicit Volitional Swallow: Unable to elicit Exam Limitations: Poor positioning; Limited visibility Goal Planning: Prognosis for improved oropharyngeal function: Fair Barriers to Reach Goals: Cognitive deficits; Severity of deficits No data recorded Patient/Family Stated Goal: continue to progress Consulted and agree with results and recommendations: Patient Pain: Pain Assessment Pain Assessment: No/denies pain Facial Expression: 0 Body Movements: 0 Muscle Tension: 0 Compliance with ventilator (intubated pts.): N/A Vocalization (extubated pts.): 0 CPOT Total: 0 Pain Intervention(s): Limited activity within patient's tolerance; Monitored during session; Repositioned End of Session: Start Time:SLP Start Time (ACUTE ONLY): 1159 Stop Time: SLP Stop Time (ACUTE ONLY): 1214 Time Calculation:SLP Time Calculation (min) (ACUTE ONLY): 15 min Charges: SLP Evaluations $ SLP Speech Visit: 1 Visit SLP Evaluations $MBS Swallow: 1 Procedure  $Swallowing Treatment: 1 Procedure SLP visit diagnosis: SLP Visit Diagnosis: Dysphagia, pharyngeal phase (R13.13) Past Medical History: Past Medical History: Diagnosis Date  Allergy   Alopecia   Anal cancer (HCC) 08/14/2013  invasive squamous cell ca, s/p radiation 10/20-11/26/14 60.4Gy/75fx and chemo  Anxiety   Aortoiliac occlusive disease (HCC) 08/14/2017  Arthritis   B12 deficiency anemia 09/14/2015  Blood transfusion without reported diagnosis   BPPV (benign paroxysmal positional vertigo) 07/08/2015  Cardiomyopathy (HCC) 11/03/2017  Chronic back pain   Chronic daily headache 03/29/2013  takes bc powder  Closed right hip fracture (HCC) 09/10/2015  Coronary artery disease involving native coronary artery of native heart without angina pectoris 10/13/2017  DES to mid RCA  Depression   Essential (hemorrhagic) thrombocythemia (HCC) 09/06/2018  Essential hypertension 09/25/2023  Family history of early CAD 04/08/2016  GERD (gastroesophageal reflux disease)   History of hiatal hernia   Hot flashes   Hyperlipidemia   Hyperlipidemia associated with type 2 diabetes mellitus (HCC) 05/11/2017  Hypothyroidism 09/10/2015  IBS (irritable bowel syndrome) 03/29/2013  Neck pain 07/08/2015  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  takes neurotin  Peripheral vascular disease 11/03/2017  QT prolongation   Sacroiliitis 09/06/2018  Spasms of the hands or feet 10/08/2017  Syncope 06/07/2016  Tubular adenoma of colon 09/08/2003  Vertigo   Wears glasses  Past Surgical History: Past Surgical History: Procedure Laterality Date  ABDOMINAL AORTOGRAM N/A 08/02/2017  Procedure: ABDOMINAL AORTOGRAM;  Surgeon: Darron Deatrice LABOR, MD;  Location: [facility] IN[facility]ASIVE [facility] LAB;  Service: Cardiovascular;  Laterality: N/A;  ABDOMINAL AORTOGRAM W/LOWER EXTREMITY Bilateral 09/03/2024  Procedure: ABDOMINAL AORTOGRAM W/LOWER EXTREMITY;  Surgeon: Serene Gaile ORN, MD;  Location: [facility];  Service: Cardiovascular;  Laterality: Bilateral;  AORTA - BILATERAL FEMORAL ARTERY  BYPASS GRAFT N/A 08/14/2017  Procedure: AORTA BIFEMORAL BYPASS GRAFT;  Surgeon: Oris Krystal FALCON, MD;  Location: [facility] OR;  Service: Vascular;  Laterality: N/A;  COLONOSCOPY    COLONOSCOPY N/A 08/29/2024  Procedure: COLONOSCOPY;  Surgeon: Wilhelmenia Aloha Raddle., MD;  Location: [facility] ENDOSCOPY;  Service: Gastroenterology;  Laterality: N/A;  CORONARY STENT INTERVENTION N/A 10/13/2017  Procedure: CORONARY STENT INTERVENTION;  Surgeon: Mady Bruckner, MD;  Location: [facility];  Service: Cardiovascular;  Laterality: N/A;  dental implant    ECTOPIC PREGNANCY SURGERY    EMBOLECTOMY N/A 08/14/2017  Procedure: EMBOLECTOMY FEMORAL;  Surgeon: Oris Krystal FALCON, MD;  Location: [facility] OR;  Service: Vascular;  Laterality: N/A;  FEMORAL-POPLITEAL BYPASS GRAFT Right 08/14/2017  Procedure: Right Femoral to Above Knee Popliteal Bypass Graft using Non-Reversed Greater Saphenous Vein Graft from Right Leg;  Surgeon: Oris Krystal FALCON, MD;  Location: [facility] OR;  Service: Vascular;  Laterality: Right;  FLEXIBLE SIGMOIDOSCOPY N/A 08/14/2013  Procedure: FLEXIBLE SIGMOIDOSCOPY;  Surgeon: Gwendlyn ONEIDA Buddy, MD;  Location: [facility] ENDOSCOPY;  Service: Endoscopy;  Laterality: N/A;  LAPAROTOMY N/A 10/24/2024  Procedure: EXPLORATORY LAPAROTOMY, ABDOMINAL PACKING AND WOUND VAC APPLICATION;  Surgeon: Debby Hila, MD;  Location: [facility] ORS;  Service: General;  Laterality: N/A;  (8) EIGHT 18X18 LAP SPONGE PACKED  LAPAROTOMY N/A 10/25/2024  Procedure: LAPAROTOMY, EXPLORATORY;  Surgeon: Rubin Calamity, MD;  Location: [facility] ORS;  Service: General;  Laterality: N/A;  REMOVAL OF PACKING  LEFT HEART CATH AND CORONARY ANGIOGRAPHY N/A 10/13/2017  Procedure: LEFT HEART CATH AND CORONARY ANGIOGRAPHY;  Surgeon: Mady Bruckner, MD;  Location: [facility];  Service: Cardiovascular;  Laterality: N/A;  LOWER EXTREMITY ANGIOGRAPHY Bilateral 08/02/2017  Procedure: Lower Extremity Angiography;  Surgeon: Darron Deatrice LABOR, MD;  Location: [facility] INVASIVE CV LAB;  Service: Cardiovascular;   Laterality: Bilateral;  LOWER EXTREMITY INTERVENTION N/A 09/03/2024  Procedure: LOWER EXTREMITY INTERVENTION;  Surgeon: Serene Gaile ORN, MD;  Location: [facility];  Service: Cardiovascular;  Laterality: N/A;  MULTIPLE TOOTH EXTRACTIONS    PERIPHERAL VASCULAR INTERVENTION  05/22/2018  Procedure: PERIPHERAL VASCULAR INTERVENTION;  Surgeon: Serene Gaile ORN, MD;  Location: [facility];  Service: Cardiovascular;;  SMA and Celiac  PILONIDAL CYST EXCISION    POLYPECTOMY    THROMBECTOMY FEMORAL ARTERY Right 08/14/2017  Procedure: THROMBECTOMY FEMORAL ARTERY;  Surgeon: Oris Krystal FALCON, MD;  Location: [facility] OR;  Service: Vascular;  Laterality: Right;  TONSILLECTOMY    TOTAL HIP ARTHROPLASTY  09/11/2015  Procedure: TOTAL HIP ARTHROPLASTY;  Surgeon: Evalene JONETTA Chancy, MD;  Location: [facility] OR;  Service: Orthopedics;;  ULTRASOUND GUIDANCE FOR VASCULAR ACCESS  10/13/2017  Procedure: Ultrasound Guidance For Vascular Access;  Surgeon: Mady Bruckner, MD;  Location: [facility] INVASIVE CV LAB;  Service: Cardiovascular;;  VENTRAL HERNIA REPAIR N/A 10/23/2024  Procedure: REPAIR, HERNIA, VENTRAL, LAPAROSCOPIC;  Surgeon: Rubin Calamity, MD;  Location: [facility] ORS;  Service: General;  Laterality: N/A;  VISCERAL ANGIOGRAPHY N/A 03/03/2020  Procedure: MESTENRIC ANGIOGRAPHY;  Surgeon: Serene Gaile ORN, MD;  Location: [facility] INVASIVE CV LAB;  Service: Cardiovascular;  Laterality: N/A;  VISCERAL ANGIOGRAPHY N/A 09/03/2024  Procedure: VISCERAL ANGIOGRAPHY;  Surgeon: Serene Gaile ORN, MD;  Location: [facility] INVASIVE CV LAB;  Service: Cardiovascular;  Laterality: N/A;  VISCERAL ARTERY INTERVENTION N/A 09/03/2024  Procedure: VISCERAL ARTERY INTERVENTION;  Surgeon: Serene Gaile ORN, MD;  Location: [facility] INVASIVE CV LAB;  Service: Cardiovascular;  Laterality: N/A; Ashley A Ellin 11/06/2024, 2:59 PM     Signature  -   Lavada Stank M.D on 11/08/2024 at 9:06 AM   -  To page go to [email or web address]     "

## 2024-11-08 NOTE — Plan of Care (Signed)

## 2024-11-09 DIAGNOSIS — Z8719 Personal history of other diseases of the digestive system: Secondary | ICD-10-CM | POA: Diagnosis not present

## 2024-11-09 DIAGNOSIS — Z9889 Other specified postprocedural states: Secondary | ICD-10-CM | POA: Diagnosis not present

## 2024-11-09 LAB — CBC WITH DIFFERENTIAL/PLATELET
Abs Immature Granulocytes: 0.1 K/uL — ABNORMAL HIGH (ref 0.00–0.07)
Basophils Absolute: 0 K/uL (ref 0.0–0.1)
Basophils Relative: 1 %
Eosinophils Absolute: 0.1 K/uL (ref 0.0–0.5)
Eosinophils Relative: 1 %
HCT: 24.7 % — ABNORMAL LOW (ref 36.0–46.0)
Hemoglobin: 8 g/dL — ABNORMAL LOW (ref 12.0–15.0)
Immature Granulocytes: 1 %
Lymphocytes Relative: 12 %
Lymphs Abs: 0.9 K/uL (ref 0.7–4.0)
MCH: 31 pg (ref 26.0–34.0)
MCHC: 32.4 g/dL (ref 30.0–36.0)
MCV: 95.7 fL (ref 80.0–100.0)
Monocytes Absolute: 0.8 K/uL (ref 0.1–1.0)
Monocytes Relative: 11 %
Neutro Abs: 5.5 K/uL (ref 1.7–7.7)
Neutrophils Relative %: 74 %
Platelets: 577 K/uL — ABNORMAL HIGH (ref 150–400)
RBC: 2.58 MIL/uL — ABNORMAL LOW (ref 3.87–5.11)
RDW: 16.6 % — ABNORMAL HIGH (ref 11.5–15.5)
WBC: 7.4 K/uL (ref 4.0–10.5)
nRBC: 0.3 % — ABNORMAL HIGH (ref 0.0–0.2)

## 2024-11-09 LAB — COMPREHENSIVE METABOLIC PANEL WITH GFR
ALT: 8 U/L (ref 0–44)
AST: 16 U/L (ref 15–41)
Albumin: 3.2 g/dL — ABNORMAL LOW (ref 3.5–5.0)
Alkaline Phosphatase: 84 U/L (ref 38–126)
Anion gap: 10 (ref 5–15)
BUN: 40 mg/dL — ABNORMAL HIGH (ref 8–23)
CO2: 21 mmol/L — ABNORMAL LOW (ref 22–32)
Calcium: 8.7 mg/dL — ABNORMAL LOW (ref 8.9–10.3)
Chloride: 106 mmol/L (ref 98–111)
Creatinine, Ser: 1.43 mg/dL — ABNORMAL HIGH (ref 0.44–1.00)
GFR, Estimated: 40 mL/min — ABNORMAL LOW
Glucose, Bld: 111 mg/dL — ABNORMAL HIGH (ref 70–99)
Potassium: 4.4 mmol/L (ref 3.5–5.1)
Sodium: 137 mmol/L (ref 135–145)
Total Bilirubin: <0.2 mg/dL (ref 0.0–1.2)
Total Protein: 5.8 g/dL — ABNORMAL LOW (ref 6.5–8.1)

## 2024-11-09 LAB — GLUCOSE, CAPILLARY
Glucose-Capillary: 120 mg/dL — ABNORMAL HIGH (ref 70–99)
Glucose-Capillary: 116 mg/dL — ABNORMAL HIGH (ref 70–99)
Glucose-Capillary: 109 mg/dL — ABNORMAL HIGH (ref 70–99)
Glucose-Capillary: 118 mg/dL — ABNORMAL HIGH (ref 70–99)

## 2024-11-09 LAB — MAGNESIUM: Magnesium: 1.9 mg/dL (ref 1.7–2.4)

## 2024-11-09 LAB — PHOSPHORUS: Phosphorus: 5 mg/dL — ABNORMAL HIGH (ref 2.5–4.6)

## 2024-11-09 MED ORDER — GABAPENTIN 250 MG/5ML PO SOLN
100.0000 mg | Freq: Three times a day (TID) | ORAL | Status: DC
  Administered 2024-11-10 – 2024-11-12 (×7): 100 mg
  Filled 2024-11-09 (×8): qty 2

## 2024-11-09 MED ORDER — TRAMADOL HCL 50 MG PO TABS
50.0000 mg | ORAL_TABLET | Freq: Two times a day (BID) | ORAL | Status: DC | PRN
  Administered 2024-11-15 – 2024-11-25 (×6): 50 mg via ORAL
  Filled 2024-11-09 (×2): qty 1

## 2024-11-09 MED ORDER — HYDRALAZINE HCL 50 MG PO TABS
50.0000 mg | ORAL_TABLET | Freq: Three times a day (TID) | ORAL | Status: DC
  Administered 2024-11-09 – 2024-11-24 (×39): 50 mg
  Filled 2024-11-09 (×24): qty 1

## 2024-11-09 NOTE — Plan of Care (Signed)
°  Problem: Education: Goal: Knowledge of General Education information will improve Description: Including pain rating scale, medication(s)/side effects and non-pharmacologic comfort measures Outcome: Progressing   Problem: Health Behavior/Discharge Planning: Goal: Ability to manage health-related needs will improve Outcome: Progressing   Problem: Clinical Measurements: Goal: Ability to maintain clinical measurements within normal limits will improve Outcome: Progressing Goal: Will remain free from infection Outcome: Progressing Goal: Diagnostic test results will improve Outcome: Progressing Goal: Respiratory complications will improve Outcome: Progressing Goal: Cardiovascular complication will be avoided Outcome: Progressing   Problem: Activity: Goal: Risk for activity intolerance will decrease Outcome: Progressing   Problem: Nutrition: Goal: Adequate nutrition will be maintained Outcome: Progressing   Problem: Coping: Goal: Level of anxiety will decrease Outcome: Progressing   Problem: Elimination: Goal: Will not experience complications related to bowel motility Outcome: Progressing Goal: Will not experience complications related to urinary retention Outcome: Progressing   Problem: Pain Managment: Goal: General experience of comfort will improve and/or be controlled Outcome: Progressing   Problem: Safety: Goal: Ability to remain free from injury will improve Outcome: Progressing   Problem: Skin Integrity: Goal: Risk for impaired skin integrity will decrease Outcome: Progressing   Problem: Activity: Goal: Ability to tolerate increased activity will improve Outcome: Progressing   Problem: Respiratory: Goal: Ability to maintain a clear airway and adequate ventilation will improve Outcome: Progressing   Problem: Role Relationship: Goal: Method of communication will improve Outcome: Progressing   Problem: Education: Goal: Knowledge of disease or condition  will improve Outcome: Progressing Goal: Knowledge of secondary prevention will improve (MUST DOCUMENT ALL) Outcome: Progressing Goal: Knowledge of patient specific risk factors will improve (DELETE if not current risk factor) Outcome: Progressing   Problem: Ischemic Stroke/TIA Tissue Perfusion: Goal: Complications of ischemic stroke/TIA will be minimized Outcome: Progressing   Problem: Coping: Goal: Will verbalize positive feelings about self Outcome: Progressing Goal: Will identify appropriate support needs Outcome: Progressing   Problem: Health Behavior/Discharge Planning: Goal: Ability to manage health-related needs will improve Outcome: Progressing Goal: Goals will be collaboratively established with patient/family Outcome: Progressing   Problem: Self-Care: Goal: Ability to participate in self-care as condition permits will improve Outcome: Progressing Goal: Verbalization of feelings and concerns over difficulty with self-care will improve Outcome: Progressing Goal: Ability to communicate needs accurately will improve Outcome: Progressing   Problem: Nutrition: Goal: Risk of aspiration will decrease Outcome: Progressing Goal: Dietary intake will improve Outcome: Progressing

## 2024-11-09 NOTE — Progress Notes (Signed)
 " PROGRESS NOTE    Erin [name]  FMW:[id number] DOB: 28-Oct-1956 DOA: 10/23/2024 PCP: [name] [name], MD   Brief Narrative:  68 year old woman who presented to [facility] 12/3 for elective hernia repair. 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  Patient admitted initially for postop hernia repair found to have hemoperitoneum with hemorrhagic shock postoperatively requiring intubation and pressors.  Her course was subsequently complicated by bilateral strokes thought to be provoked by hypotension.  At this time patient is stabilizing, she has been extubated, continues tube feeds via NG and is otherwise stable from a surgical standpoint.  Transition out of the ICU to hospitalist team on 12/15 with plan for ongoing care with ultimate plan for disposition pending patient's clinical course.  Hospital course 12/3 admitted to ICU postop 12/3-post hernia repair 12/4 taken back to the OR for hemoperitoneum-active bleed was not identified, abdomen packed with wound VAC in place 12/5 back to the OR for an open abdomen-no evidence of ongoing bleeding and abdomen was closed 12/8 - CT Head with interval development (since 04/2024) of multiple cortical and subcortical hypodensities in bilateral cerebellar and cerebral hemispheres suggestive of acute/subacute infarcts. MRI Brain with numerous areas of restricted diffusion throughout the cerebral hemispheres/cerebellum c/f acute infarcts of embolic etiology, associated mild edema (R occipital lobe, L temporal lobe).  12/11 tolerating PSV 5/5 >> extubated 12/15 transition to TRH team - goals of care discussion with husband(see below) 12/17 General surgery evaluating for PEG -pending SLP re-evaluation in hopes she will be able to  improve -LTAC was denied by insurance  Assessment & Plan:    Hemorrhagic shock from hypovolemia caused during ex lap hernia repair SHOCK - required stay in ICU, pressors, received massive blood transfusion protocol with 5 units of packed RBC and 2 units of cryoprecipitate around 10/23/2024, unfortunately incurred anoxic brain injury/CVA, brief support with intubation.  Shock has resolved, monitor with supportive care.  Acute bilateral cerebral and cerebellar infarcts, causing encephalopathy, aphasia, dysphagia - CT head with multiple cortical and subcortical hypodensities, confirmed by MRI, likely due to profound hypotension from #1 above, seen by neuro, patient remains minimally verbal, has dysphagia requiring NG tube feeds, may require peg tube, speech PT OT working.  Currently on aspirin .  Seen by stroke team.  Continue aspirin , stable A1c and LDL.  Stable EEG.  Echocardiogram with preserved EF of 60%.  MRI stable.    HTN  - Continue Amlodipine , Carvedilol , added hydralazine  for better blood pressure control, continue as needed IV hydralazine .    S/p hernia repair, elective 12/3, S/p ex-lap 12/4, 12/5  - Postoperative management per CCS,  Wound care per surgery   Acute kidney injury to ATN from shock.  History of left renal artery stenosis.  Renal function improving.  Monitor   Acute respiratory insufficiency , extubated 12/11 - happened due to anoxic brain injury/CVA.  Resolved.  HCAP -Enterobacter 12/6  - Completed antibiotic treatment.  History of peripheral neuropathy and itching.  Usually takes high doses of Neurontin  at home, cut down as she has some encephalopathy on 11/09/2024, reduce sedative medications  Mild metabolic encephalopathy.  See above.  Reduce sedating medications including Neurontin .  DVT prophylaxis: heparin  injection 5,000 Units Start: 10/28/24 2200 SCD's Start: 10/23/24 1918 Code Status:   Code Status: Full Code Family Communication: Husband  at bedside on  11/07/2024, 11/08/2024, 11/09/2024  Status is: Inpt  Dispo: LTAC  Consultants:  PCCM, General surgery, Neuro  Procedures:  Hernia repair 12/3 and ex-lap 12/5   Antimicrobials:  None(completed prior course)   Subjective: Patient in bed appears to be in no distress but more sleepy this morning, mains largely nonverbal.    Objective: Vitals:   11/09/24 0406 11/09/24 0500 11/09/24 0712 11/09/24 0800  BP: (!) 124/55  134/67 (!) 111/53  Pulse: 90   96  Resp:    18  Temp: 97.7 F (36.5 C)   99.1 F (37.3 C)  TempSrc: Axillary   Axillary  SpO2:    94%  Weight:  47.3 kg    Height:        Intake/Output Summary (Last 24 hours) at 11/09/2024 1023 Last data filed at 11/09/2024 0815 Gross per 24 hour  Intake 600 ml  Output 550 ml  Net 50 ml   Filed Weights   11/06/24 0500 11/08/24 0500 11/09/24 0500  Weight: 48.2 kg 48 kg 47.3 kg    Examination:  Awake but remains nonverbal, does open eyes to loud commands from time to time but inconsistent, withdraws to pain, NG tube in place, [place].AT,PERRAL Supple Neck, No JVD,   Symmetrical Chest wall movement, Good air movement bilaterally, CTAB RRR,No Gallops, Rubs or new Murmurs,  +ve B.Sounds, Abd Soft, No tenderness, midline abdominal postop scar stable under bandage No Cyanosis, Clubbing or edema      Data Review:   Patient Lines/Drains/Airways Status     Active Line/Drains/Airways     Name Placement date Placement time Site Days   Peripheral IV 10/23/24 20 G 1.75 Anterior;Left Forearm 10/23/24  2037  Forearm  17   External Urinary Catheter 11/01/24  1900  --  8   Small Bore Feeding Tube Left nare Marking at nare/corner of mouth 68 cm 11/01/24  1536  Left nare  8   Wound 10/23/24 0923 Surgical Closed Surgical Incision Abdomen 10/23/24  0923  Abdomen  17   Wound 10/23/24 9076 Surgical Laparoscopic Abdomen 1: Upper;Left 2: Left;Lower 10/23/24  0923  Abdomen  17   Wound 11/01/24 1600 Irritant Contact Dermatitis Buttocks  11/01/24  1600  Buttocks  8             Inpatient Medications  Scheduled Meds:  amLODipine   10 mg Per Tube Daily   aspirin   81 mg Per Tube Daily   carvedilol   12.5 mg Per Tube BID WC   Chlorhexidine  Gluconate Cloth  6 each Topical Daily   feeding supplement (PROSource TF20)  60 mL Per Tube Daily   fiber  1 packet Per Tube BID   free water   200 mL Per Tube Q8H   [START ON 11/10/2024] gabapentin   100 mg Per Tube Q8H   Gerhardt's butt cream   Topical BID   heparin  injection (subcutaneous)  5,000 Units Subcutaneous Q8H   hydrALAZINE   50 mg Per Tube Q8H   Influenza vac split trivalent PF  0.5 mL Intramuscular Tomorrow-1000   levothyroxine   75 mcg Per Tube Q0600   methylphenidate   5 mg Oral q morning   mouth rinse  15 mL Mouth Rinse 4 times per day   pantoprazole  (PROTONIX ) IV  40 mg Intravenous QHS   Continuous Infusions:  feeding supplement (KATE FARMS STANDARD 1.4) Liquid 1,000 mL (11/08/24 0914)   PRN Meds:.acetaminophen , hydrALAZINE , labetalol , ondansetron  **OR** ondansetron  (ZOFRAN ) IV, mouth rinse, traMADol   DVT Prophylaxis  heparin  injection 5,000  Units Start: 10/28/24 2200 SCD's Start: 10/23/24 1918       Recent Labs  Lab 11/03/24 0438 11/04/24 0436 11/07/24 0719 11/08/24 0437 11/09/24 0453  WBC 9.5 10.4 8.4 10.7* 7.4  HGB 8.7* 8.9* 8.5* 8.6* 8.0*  HCT 27.1* 28.1* 27.1* 26.0* 24.7*  PLT 414* 476* 609* 631* 577*  MCV 93.8 96.9 96.8 95.2 95.7  MCH 30.1 30.7 30.4 31.5 31.0  MCHC 32.1 31.7 31.4 33.1 32.4  RDW 15.5 15.7* 16.1* 16.1* 16.6*  LYMPHSABS  --  0.9 0.7 1.0 0.9  MONOABS  --  1.2* 0.8 0.9 0.8  EOSABS  --  0.1 0.1 0.1 0.1  BASOSABS  --  0.0 0.0 0.0 0.0    Recent Labs  Lab 11/03/24 0438 11/04/24 0436 11/07/24 0719 11/08/24 0437 11/09/24 0453  NA 138 141 138 136 137  K 3.3* 4.3 3.9 4.0 4.4  CL 108 113* 108 106 106  CO2 20* 19* 20* 18* 21*  ANIONGAP 10 9 10 12 10   GLUCOSE 113* 99 112* 105* 111*  BUN 37* 34* 36* 38* 40*  CREATININE 1.57*  1.50* 1.32* 1.28* 1.43*  AST  --   --  19 19 16   ALT  --   --  12 11 8   ALKPHOS  --   --  89 87 84  BILITOT  --   --  <0.2 <0.2 <0.2  ALBUMIN   --   --  3.2* 3.1* 3.2*  MG 1.8 1.9 1.7 1.9 1.9  PHOS 3.4 3.5 4.4 4.2 5.0*  CALCIUM  7.8* 8.1* 8.8* 8.8* 8.7*      Recent Labs  Lab 11/03/24 0438 11/04/24 0436 11/07/24 0719 11/08/24 0437 11/09/24 0453  MG 1.8 1.9 1.7 1.9 1.9  CALCIUM  7.8* 8.1* 8.8* 8.8* 8.7*    --------------------------------------------------------------------------------------------------------------- Lab Results  Component Value Date   CHOL 91 10/29/2024   HDL 12 (L) 10/29/2024   LDLCALC 13 10/29/2024   LDLDIRECT 70.0 04/07/2021   TRIG 331 (H) 10/29/2024   CHOLHDL 7.6 10/29/2024    Lab Results  Component Value Date   HGBA1C 4.9 10/18/2024   No results for input(s): TSH, T4TOTAL, FREET4, T3FREE, THYROIDAB in the last 72 hours. No results for input(s): VITAMINB12, FOLATE, FERRITIN, TIBC, IRON, RETICCTPCT in the last 72 hours. ------------------------------------------------------------------------------------------------------------------ Cardiac Enzymes No results for input(s): CKMB, TROPONINI, MYOGLOBIN in the last 168 hours.  Invalid input(s): CK  Micro Results Recent Results (from the past 240 hours)  Gastrointestinal Panel by PCR , Stool     Status: None   Collection Time: 11/02/24  4:03 AM   Specimen: Stool  Result Value Ref Range Status   Campylobacter species NOT DETECTED NOT DETECTED Final   Plesimonas shigelloides NOT DETECTED NOT DETECTED Final   Salmonella species NOT DETECTED NOT DETECTED Final   Yersinia enterocolitica NOT DETECTED NOT DETECTED Final   Vibrio species NOT DETECTED NOT DETECTED Final   Vibrio cholerae NOT DETECTED NOT DETECTED Final   Enteroaggregative E coli (EAEC) NOT DETECTED NOT DETECTED Final   Enteropathogenic E coli (EPEC) NOT DETECTED NOT DETECTED Final   Enterotoxigenic E coli  (ETEC) NOT DETECTED NOT DETECTED Final   Shiga like toxin producing E coli (STEC) NOT DETECTED NOT DETECTED Final   Shigella/Enteroinvasive E coli (EIEC) NOT DETECTED NOT DETECTED Final   Cryptosporidium NOT DETECTED NOT DETECTED Final   Cyclospora cayetanensis NOT DETECTED NOT DETECTED Final   Entamoeba histolytica NOT DETECTED NOT DETECTED Final   Giardia lamblia NOT DETECTED NOT DETECTED Final  Adenovirus F40/41 NOT DETECTED NOT DETECTED Final   Astrovirus NOT DETECTED NOT DETECTED Final   Norovirus GI/GII NOT DETECTED NOT DETECTED Final   Rotavirus A NOT DETECTED NOT DETECTED Final   Sapovirus (I, II, IV, and V) NOT DETECTED NOT DETECTED Final    Comment: Performed at [facility], [address] [place], [place] [address]  C Difficile Quick Screen (NO PCR Reflex)     Status: None   Collection Time: 11/02/24 12:23 PM   Specimen: STOOL  Result Value Ref Range Status   C Diff antigen NEGATIVE NEGATIVE Final   C Diff toxin NEGATIVE NEGATIVE Final   C Diff interpretation No C. difficile detected.  Final    Comment: Performed at [facility], [address] [place], [place] [address]    Radiology Reports  No results found.     Signature  -   Lavada Stank M.D on 11/09/2024 at 10:23 AM   -  To page go to [email or web address]     "

## 2024-11-09 NOTE — Plan of Care (Signed)

## 2024-11-09 NOTE — Plan of Care (Signed)
" °  Problem: Clinical Measurements: Goal: Ability to maintain clinical measurements within normal limits will improve Outcome: Progressing Goal: Will remain free from infection Outcome: Progressing Goal: Diagnostic test results will improve Outcome: Progressing Goal: Respiratory complications will improve Outcome: Progressing Goal: Cardiovascular complication will be avoided Outcome: Progressing   Problem: Nutrition: Goal: Adequate nutrition will be maintained Outcome: Progressing   Problem: Coping: Goal: Level of anxiety will decrease Outcome: Progressing   Problem: Elimination: Goal: Will not experience complications related to bowel motility Outcome: Progressing Goal: Will not experience complications related to urinary retention Outcome: Progressing   Problem: Pain Managment: Goal: General experience of comfort will improve and/or be controlled Outcome: Progressing   Problem: Safety: Goal: Ability to remain free from injury will improve Outcome: Progressing   Problem: Skin Integrity: Goal: Risk for impaired skin integrity will decrease Outcome: Progressing   Problem: Respiratory: Goal: Ability to maintain a clear airway and adequate ventilation will improve Outcome: Progressing   Problem: Nutrition: Goal: Risk of aspiration will decrease Outcome: Progressing   "

## 2024-11-09 NOTE — Progress Notes (Signed)
 Physical Therapy Treatment Patient Details Name: Erin [name] MRN: [id number] DOB: 07-Dec-1955 Today's Date: 11/09/2024   History of Present Illness 69 y.o. F  adm to [facility] 12/3 for ventral hernia repair. 12/4 ex lap with abdominal hemorrhage and remained intubated post op. 12/5 abdominal closure. 12/8 MRI with multiple embolic infarcts. 12/9 transfer to [facility]. 12/11 extubated. PMHx: anal CA, hypothyroidism, GERD, CAD, cardiomyopathy, PVD, HLD, HTN, RL.    PT Comments  Session focused on building upright tolerance and promoting trunk control while seated EOB. Pt requires maxA for bed mobility and demonstrates significant forward shoulder posture upon sitting EOB. Pt does well following cues to sit tall and is able to temporarily maintain the position. Trunk rotation, static balance, and multi directional reaches performed while sitting EOB. Pt tolerated sitting EOB ~15 minutes. Pt increasingly interactive throughout session and increasing attempts to verbalize. Supine and seated exercises performed with max verbal and tactile cuing. Pt would benefit from continued PT services focused on strength, trunk control, bed mobility, and transfers to improve functional mobility.     If plan is discharge home, recommend the following: A lot of help with bathing/dressing/bathroom;A lot of help with walking and/or transfers;Assistance with cooking/housework;Assistance with feeding;Direct supervision/assist for medications management;Direct supervision/assist for financial management;Assist for transportation;Help with stairs or ramp for entrance;Supervision due to cognitive status   Can travel by private vehicle     No  Equipment Recommendations  Rolling walker (2 wheels);BSC/3in1;Wheelchair (measurements PT);Wheelchair cushion (measurements PT);Hospital bed;Hoyer lift    Recommendations for Other Services       Precautions / Restrictions Precautions Precautions: Fall Recall of  Precautions/Restrictions: Impaired Restrictions Weight Bearing Restrictions Per Provider Order: No     Mobility  Bed Mobility Overal bed mobility: Needs Assistance Bed Mobility: Rolling, Supine to Sit, Sit to Supine Rolling: Max assist   Supine to sit: Max assist Sit to supine: Max assist   General bed mobility comments: Pt follows cues and assist to reach for bed rail but is unable to pull herself into a sidelying position at this time. B LE assist to position EOB. Pt tolerates sitting EOB ~15 minutes, returned to supine position when starting to fall asleep while seated. Min-maxA required for trunk control while seated EOB.    Transfers                        Ambulation/Gait                   Stairs             Wheelchair Mobility     Tilt Bed    Modified Rankin (Stroke Patients Only)       Balance Overall balance assessment: Needs assistance Sitting-balance support: Bilateral upper extremity supported, Feet supported Sitting balance-Leahy Scale: Poor Sitting balance - Comments: Pt with varying trunk control while seated EOB. Initially requiring maxA to maintain upright position. Pt breifly able to hold upright trunk ~2s without assist, requiring max cues. Pt demonstrates postrual corrections when cued, able to temporarily correct forward shoulder posture. Trunk rotation and reaching outside BOS performed while seated with trunk support. Postural control: Posterior lean (Pt most likely to demonstrates anterior or posterior lean, no isolated direction noted during session.)                                  Communication Communication Communication: Impaired Factors Affecting Communication:  Difficulty expressing self;Reduced clarity of speech  Cognition Arousal: Lethargic Behavior During Therapy: WFL for tasks assessed/performed   PT - Cognitive impairments: Attention, Awareness, Safety/Judgement, Initiation, Sequencing, Problem  solving, Difficult to assess Difficult to assess due to: Impaired communication                       Following commands: Impaired Following commands impaired: Follows one step commands inconsistently (Pt doing better following cues this session, sometimes requires repeated cues of various forms.)    Cueing Cueing Techniques: Verbal cues, Tactile cues, Visual cues  Exercises General Exercises - Lower Extremity Quad Sets: AROM, Both, 5 reps, Supine Short Arc Quad: AROM, Supine, Both, 5 reps Long Arc Quad: AROM, Both, 5 reps, Seated    General Comments General comments (skin integrity, edema, etc.): VSS throughout. Tube feeds leaking onto gown upon arrival. No new skin abnormalities noted.      Pertinent Vitals/Pain Pain Assessment Pain Assessment: No/denies pain    Home Living                          Prior Function            PT Goals (current goals can now be found in the care plan section) Acute Rehab PT Goals Patient Stated Goal: No additional goals stated during session. Progress towards PT goals: Progressing toward goals    Frequency    Min 2X/week      PT Plan      Co-evaluation              AM-PAC PT 6 Clicks Mobility   Outcome Measure  Help needed turning from your back to your side while in a flat bed without using bedrails?: A Lot Help needed moving from lying on your back to sitting on the side of a flat bed without using bedrails?: A Lot Help needed moving to and from a bed to a chair (including a wheelchair)?: Total Help needed standing up from a chair using your arms (e.g., wheelchair or bedside chair)?: Total Help needed to walk in hospital room?: Total Help needed climbing 3-5 steps with a railing? : Total 6 Click Score: 8    End of Session   Activity Tolerance: Patient tolerated treatment well Patient left: in bed;with call bell/phone within reach;with bed alarm set;with family/visitor present Nurse  Communication: Mobility status PT Visit Diagnosis: Other abnormalities of gait and mobility (R26.89);Muscle weakness (generalized) (M62.81) Hemiplegia - Right/Left: Left Hemiplegia - dominant/non-dominant: Non-dominant Hemiplegia - caused by: Cerebral infarction     Time: 1410-1435 PT Time Calculation (min) (ACUTE ONLY): 25 min  Charges:    $Therapeutic Activity: 23-37 mins PT General Charges $$ ACUTE PT VISIT: 1 Visit                     Sabra Morel, PT, DPT  Acute Rehabilitation Services         Office: [phone]      Sabra MARLA Morel 11/09/2024, 2:56 PM

## 2024-11-10 DIAGNOSIS — Z8719 Personal history of other diseases of the digestive system: Secondary | ICD-10-CM | POA: Diagnosis not present

## 2024-11-10 DIAGNOSIS — Z9889 Other specified postprocedural states: Secondary | ICD-10-CM | POA: Diagnosis not present

## 2024-11-10 LAB — COMPREHENSIVE METABOLIC PANEL WITH GFR
ALT: 10 U/L (ref 0–44)
AST: 17 U/L (ref 15–41)
Albumin: 3.2 g/dL — ABNORMAL LOW (ref 3.5–5.0)
Alkaline Phosphatase: 83 U/L (ref 38–126)
Anion gap: 11 (ref 5–15)
BUN: 47 mg/dL — ABNORMAL HIGH (ref 8–23)
CO2: 21 mmol/L — ABNORMAL LOW (ref 22–32)
Calcium: 9 mg/dL (ref 8.9–10.3)
Chloride: 104 mmol/L (ref 98–111)
Creatinine, Ser: 1.6 mg/dL — ABNORMAL HIGH (ref 0.44–1.00)
GFR, Estimated: 35 mL/min — ABNORMAL LOW
Glucose, Bld: 105 mg/dL — ABNORMAL HIGH (ref 70–99)
Potassium: 4.9 mmol/L (ref 3.5–5.1)
Sodium: 136 mmol/L (ref 135–145)
Total Bilirubin: <0.2 mg/dL (ref 0.0–1.2)
Total Protein: 5.7 g/dL — ABNORMAL LOW (ref 6.5–8.1)

## 2024-11-10 LAB — CBC WITH DIFFERENTIAL/PLATELET
Abs Immature Granulocytes: 0.12 K/uL — ABNORMAL HIGH (ref 0.00–0.07)
Basophils Absolute: 0 K/uL (ref 0.0–0.1)
Basophils Relative: 1 %
Eosinophils Absolute: 0.1 K/uL (ref 0.0–0.5)
Eosinophils Relative: 2 %
HCT: 26.1 % — ABNORMAL LOW (ref 36.0–46.0)
Hemoglobin: 8.4 g/dL — ABNORMAL LOW (ref 12.0–15.0)
Immature Granulocytes: 2 %
Lymphocytes Relative: 16 %
Lymphs Abs: 1 K/uL (ref 0.7–4.0)
MCH: 31 pg (ref 26.0–34.0)
MCHC: 32.2 g/dL (ref 30.0–36.0)
MCV: 96.3 fL (ref 80.0–100.0)
Monocytes Absolute: 0.9 K/uL (ref 0.1–1.0)
Monocytes Relative: 14 %
Neutro Abs: 4.5 K/uL (ref 1.7–7.7)
Neutrophils Relative %: 65 %
Platelets: 558 K/uL — ABNORMAL HIGH (ref 150–400)
RBC: 2.71 MIL/uL — ABNORMAL LOW (ref 3.87–5.11)
RDW: 16.9 % — ABNORMAL HIGH (ref 11.5–15.5)
WBC: 6.7 K/uL (ref 4.0–10.5)
nRBC: 0 % (ref 0.0–0.2)

## 2024-11-10 LAB — MAGNESIUM: Magnesium: 2.1 mg/dL (ref 1.7–2.4)

## 2024-11-10 LAB — PHOSPHORUS: Phosphorus: 5 mg/dL — ABNORMAL HIGH (ref 2.5–4.6)

## 2024-11-10 LAB — GLUCOSE, CAPILLARY
Glucose-Capillary: 103 mg/dL — ABNORMAL HIGH (ref 70–99)
Glucose-Capillary: 110 mg/dL — ABNORMAL HIGH (ref 70–99)
Glucose-Capillary: 113 mg/dL — ABNORMAL HIGH (ref 70–99)
Glucose-Capillary: 114 mg/dL — ABNORMAL HIGH (ref 70–99)
Glucose-Capillary: 117 mg/dL — ABNORMAL HIGH (ref 70–99)
Glucose-Capillary: 120 mg/dL — ABNORMAL HIGH (ref 70–99)

## 2024-11-10 MED ORDER — SODIUM ZIRCONIUM CYCLOSILICATE 10 G PO PACK
10.0000 g | PACK | Freq: Once | ORAL | Status: AC
  Administered 2024-11-10: 10 g
  Filled 2024-11-10: qty 1

## 2024-11-10 NOTE — Addendum Note (Signed)
 Addendum  created 11/10/24 2329 by Darlyn Rush, MD   Clinical Note Signed, Intraprocedure Blocks edited, SmartForm saved

## 2024-11-10 NOTE — Plan of Care (Signed)

## 2024-11-10 NOTE — Progress Notes (Signed)
 " PROGRESS NOTE    Erin [name]  FMW:[id number] DOB: 1956/04/30 DOA: 10/23/2024 PCP: [name] [name], MD   Brief Narrative:  68 year old woman who presented to [facility] 12/3 for elective hernia repair. 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  Patient admitted initially for postop hernia repair found to have hemoperitoneum with hemorrhagic shock postoperatively requiring intubation and pressors.  Her course was subsequently complicated by bilateral strokes thought to be provoked by hypotension.  At this time patient is stabilizing, she has been extubated, continues tube feeds via NG and is otherwise stable from a surgical standpoint.  Transition out of the ICU to hospitalist team on 12/15 with plan for ongoing care with ultimate plan for disposition pending patient's clinical course.  Hospital course 12/3 admitted to ICU postop 12/3-post hernia repair 12/4 taken back to the OR for hemoperitoneum-active bleed was not identified, abdomen packed with wound VAC in place 12/5 back to the OR for an open abdomen-no evidence of ongoing bleeding and abdomen was closed 12/8 - CT Head with interval development (since 04/2024) of multiple cortical and subcortical hypodensities in bilateral cerebellar and cerebral hemispheres suggestive of acute/subacute infarcts. MRI Brain with numerous areas of restricted diffusion throughout the cerebral hemispheres/cerebellum c/f acute infarcts of embolic etiology, associated mild edema (R occipital lobe, L temporal lobe).  12/11 tolerating PSV 5/5 >> extubated 12/15 transition to TRH team - goals of care discussion with husband(see below) 12/17 General surgery evaluating for PEG -pending SLP re-evaluation in hopes she will be able to  improve -LTAC was denied by insurance  Assessment & Plan:    Hemorrhagic shock from hypovolemia caused during ex lap hernia repair SHOCK - required stay in ICU, pressors, received massive blood transfusion protocol with 5 units of packed RBC and 2 units of cryoprecipitate around 10/23/2024, unfortunately incurred anoxic brain injury/CVA, brief support with intubation.  Shock has resolved, monitor with supportive care.  Acute bilateral cerebral and cerebellar infarcts, causing encephalopathy, aphasia, dysphagia - CT head with multiple cortical and subcortical hypodensities, confirmed by MRI, likely due to profound hypotension from #1 above, seen by neuro, patient remains minimally verbal, has dysphagia requiring NG tube feeds, may require peg tube, speech PT OT working.  Currently on aspirin .  Seen by stroke team.  Continue aspirin , stable A1c and LDL.  Stable EEG.  Echocardiogram with preserved EF of 60%.  MRI stable.    HTN  - Continue Amlodipine , Carvedilol , added hydralazine  for better blood pressure control, continue as needed IV hydralazine .    S/p hernia repair, elective 12/3, S/p ex-lap 12/4, 12/5  - Postoperative management per CCS,  Wound care per surgery   Acute kidney injury to ATN from shock.  History of left renal artery stenosis.  Renal function improving.  Monitor   Acute respiratory insufficiency , extubated 12/11 - happened due to anoxic brain injury/CVA.  Resolved.  HCAP - Enterobacter 12/6  - Completed antibiotic treatment.  History of peripheral neuropathy and itching.  Usually takes high doses of Neurontin  at home, cut down as she has some encephalopathy on 11/09/2024, reduce sedative medications  Mild metabolic encephalopathy.  See above.  Reduce sedating medications including Neurontin .  DVT prophylaxis: heparin  injection 5,000 Units Start: 10/28/24 2200 SCD's Start: 10/23/24 1918 Code Status:   Code Status: Full Code Family Communication:  Husband at bedside on  11/07/2024, 11/08/2024, 11/09/2024  Status is: Inpt  Dispo: LTAC  Consultants:  PCCM, General surgery, Neuro  Procedures:  Hernia repair 12/3 and ex-lap 12/5   Antimicrobials:  None(completed prior course)   Subjective: Patient in bed appears to be in no distress but more sleepy this morning, mains largely nonverbal.    Objective: Vitals:   11/10/24 0716 11/10/24 0717 11/10/24 0721 11/10/24 0800  BP:    (!) 145/65  Pulse: (!) 105 (!) 103 (!) 101 96  Resp:    19  Temp:    97.7 F (36.5 C)  TempSrc:    Axillary  SpO2:    96%  Weight:      Height:        Intake/Output Summary (Last 24 hours) at 11/10/2024 1010 Last data filed at 11/10/2024 0836 Gross per 24 hour  Intake 1205 ml  Output 1850 ml  Net -645 ml   Filed Weights   11/08/24 0500 11/09/24 0500 11/10/24 0500  Weight: 48 kg 47.3 kg 44.3 kg    Examination:  Awake but remains nonverbal, does open eyes to loud commands from time to time but inconsistent, withdraws to pain, NG tube in place, Kinde.AT,PERRAL Supple Neck, No JVD,   Symmetrical Chest wall movement, Good air movement bilaterally, CTAB RRR,No Gallops, Rubs or new Murmurs,  +ve B.Sounds, Abd Soft, No tenderness, midline abdominal postop scar stable under bandage No Cyanosis, Clubbing or edema      Data Review:   Patient Lines/Drains/Airways Status     Active Line/Drains/Airways     Name Placement date Placement time Site Days   Peripheral IV 10/23/24 20 G 1.75 Anterior;Left Forearm 10/23/24  2037  Forearm  18   External Urinary Catheter 11/01/24  1900  --  9   Small Bore Feeding Tube Left nare Marking at nare/corner of mouth 68 cm 11/01/24  1536  Left nare  9   Wound 10/23/24 0923 Surgical Closed Surgical Incision Abdomen 10/23/24  0923  Abdomen  18   Wound 10/23/24 0923 Surgical Laparoscopic Abdomen 1: Upper;Left 2: Left;Lower 10/23/24  0923  Abdomen  18   Wound 11/01/24 1600 Irritant Contact Dermatitis Buttocks 11/01/24  1600  Buttocks  9              Inpatient Medications  Scheduled Meds:  amLODipine   10 mg Per Tube Daily   aspirin   81 mg Per Tube Daily   carvedilol   12.5 mg Per Tube BID WC   Chlorhexidine  Gluconate Cloth  6 each Topical Daily   feeding supplement (PROSource TF20)  60 mL Per Tube Daily   fiber  1 packet Per Tube BID   free water   200 mL Per Tube Q8H   gabapentin   100 mg Per Tube Q8H   Gerhardt's butt cream   Topical BID   heparin  injection (subcutaneous)  5,000 Units Subcutaneous Q8H   hydrALAZINE   50 mg Per Tube Q8H   Influenza vac split trivalent PF  0.5 mL Intramuscular Tomorrow-1000   levothyroxine   75 mcg Per Tube Q0600   methylphenidate   5 mg Oral q morning   mouth rinse  15 mL Mouth Rinse 4 times per day   pantoprazole  (PROTONIX ) IV  40 mg Intravenous QHS   Continuous Infusions:  feeding supplement ([place] STANDARD 1.4) Liquid 1,000 mL (11/08/24 0914)   PRN Meds:.acetaminophen , hydrALAZINE , labetalol , ondansetron  **OR** ondansetron  (ZOFRAN ) IV, mouth rinse, traMADol   DVT Prophylaxis  heparin  injection 5,000 Units Start: 10/28/24 2200  SCD's Start: 10/23/24 1918       Recent Labs  Lab 11/04/24 0436 11/07/24 0719 11/08/24 0437 11/09/24 0453 11/10/24 0408  WBC 10.4 8.4 10.7* 7.4 6.7  HGB 8.9* 8.5* 8.6* 8.0* 8.4*  HCT 28.1* 27.1* 26.0* 24.7* 26.1*  PLT 476* 609* 631* 577* 558*  MCV 96.9 96.8 95.2 95.7 96.3  MCH 30.7 30.4 31.5 31.0 31.0  MCHC 31.7 31.4 33.1 32.4 32.2  RDW 15.7* 16.1* 16.1* 16.6* 16.9*  LYMPHSABS 0.9 0.7 1.0 0.9 1.0  MONOABS 1.2* 0.8 0.9 0.8 0.9  EOSABS 0.1 0.1 0.1 0.1 0.1  BASOSABS 0.0 0.0 0.0 0.0 0.0    Recent Labs  Lab 11/04/24 0436 11/07/24 0719 11/08/24 0437 11/09/24 0453 11/10/24 0408  NA 141 138 136 137 136  K 4.3 3.9 4.0 4.4 4.9  CL 113* 108 106 106 104  CO2 19* 20* 18* 21* 21*  ANIONGAP 9 10 12 10 11   GLUCOSE 99 112* 105* 111* 105*  BUN 34* 36* 38* 40* 47*  CREATININE 1.50* 1.32* 1.28* 1.43* 1.60*  AST  --  19 19 16 17   ALT   --  12 11 8 10   ALKPHOS  --  89 87 84 83  BILITOT  --  <0.2 <0.2 <0.2 <0.2  ALBUMIN   --  3.2* 3.1* 3.2* 3.2*  MG 1.9 1.7 1.9 1.9 2.1  PHOS 3.5 4.4 4.2 5.0* 5.0*  CALCIUM  8.1* 8.8* 8.8* 8.7* 9.0      Recent Labs  Lab 11/04/24 0436 11/07/24 0719 11/08/24 0437 11/09/24 0453 11/10/24 0408  MG 1.9 1.7 1.9 1.9 2.1  CALCIUM  8.1* 8.8* 8.8* 8.7* 9.0    --------------------------------------------------------------------------------------------------------------- Lab Results  Component Value Date   CHOL 91 10/29/2024   HDL 12 (L) 10/29/2024   LDLCALC 13 10/29/2024   LDLDIRECT 70.0 04/07/2021   TRIG 331 (H) 10/29/2024   CHOLHDL 7.6 10/29/2024    Lab Results  Component Value Date   HGBA1C 4.9 10/18/2024   No results for input(s): TSH, T4TOTAL, FREET4, T3FREE, THYROIDAB in the last 72 hours. No results for input(s): VITAMINB12, FOLATE, FERRITIN, TIBC, IRON, RETICCTPCT in the last 72 hours. ------------------------------------------------------------------------------------------------------------------ Cardiac Enzymes No results for input(s): CKMB, TROPONINI, MYOGLOBIN in the last 168 hours.  Invalid input(s): CK  Micro Results Recent Results (from the past 240 hours)  Gastrointestinal Panel by PCR , Stool     Status: None   Collection Time: 11/02/24  4:03 AM   Specimen: Stool  Result Value Ref Range Status   Campylobacter species NOT DETECTED NOT DETECTED Final   Plesimonas shigelloides NOT DETECTED NOT DETECTED Final   Salmonella species NOT DETECTED NOT DETECTED Final   Yersinia enterocolitica NOT DETECTED NOT DETECTED Final   Vibrio species NOT DETECTED NOT DETECTED Final   Vibrio cholerae NOT DETECTED NOT DETECTED Final   Enteroaggregative E coli (EAEC) NOT DETECTED NOT DETECTED Final   Enteropathogenic E coli (EPEC) NOT DETECTED NOT DETECTED Final   Enterotoxigenic E coli (ETEC) NOT DETECTED NOT DETECTED Final   Shiga like toxin  producing E coli (STEC) NOT DETECTED NOT DETECTED Final   Shigella/Enteroinvasive E coli (EIEC) NOT DETECTED NOT DETECTED Final   Cryptosporidium NOT DETECTED NOT DETECTED Final   Cyclospora cayetanensis NOT DETECTED NOT DETECTED Final   Entamoeba histolytica NOT DETECTED NOT DETECTED Final   Giardia lamblia NOT DETECTED NOT DETECTED Final   Adenovirus F40/41 NOT DETECTED NOT DETECTED Final   Astrovirus NOT DETECTED NOT DETECTED Final   Norovirus GI/GII NOT DETECTED NOT  DETECTED Final   Rotavirus A NOT DETECTED NOT DETECTED Final   Sapovirus (I, II, IV, and V) NOT DETECTED NOT DETECTED Final    Comment: Performed at [facility], [address] [place], [place] [address]  C Difficile Quick Screen (NO PCR Reflex)     Status: None   Collection Time: 11/02/24 12:23 PM   Specimen: STOOL  Result Value Ref Range Status   C Diff antigen NEGATIVE NEGATIVE Final   C Diff toxin NEGATIVE NEGATIVE Final   C Diff interpretation No C. difficile detected.  Final    Comment: Performed at [facility], [address], [place], [place] [address]    Radiology Reports  No results found.     Signature  -   Lavada Stank M.D on 11/10/2024 at 10:10 AM   -  To page go to [email or web address]     "

## 2024-11-10 NOTE — Progress Notes (Addendum)
 Nutrition Brief Note  Received message from pharmacy with request to review TF regimen, per provider, as potassium and phosphorus trending up over last couple of days. Notably, kidney function improving.   Initially modified to [employer] formula from Vital due to frequent and loose bowels. Appears transition to [employer] effective as no BM since 12/19, per documentation and RN report.  Likely contributing to reabsorption of K/PHOS in the gut. Discussed with MD via secure chat.  Will d/c Nutrisource fiber and monitor bowel frequency. Will modify to Nepro, if lab trends continue to worsen. However, risk worsening bowels again due to concentrated nature of Nepro formula. Notably, no bowel regimen in place. Monitor for the need for scheduled interventions.  INTERVENTION:  Continue TF via Cortrak: [place] 1.4 Standard at 40 ml/h (960 ml per day) -Add Pro-Source TF20 60 mL daily    -Free water  flushes 200 ml q6h (800 ml per day) -Provides 1592 kcal, 87 gm protein, 1567 ml free water  daily (TF + FWF)   Discontinue Nutrisource Fiber BID   If/when diet advanced, add Boost Breeze po TID, each supplement provides 250 kcal and 9 grams of protein   Monitor GOC discussion and modify plan of care as indicated  Monitor bowels and need for scheduled bowel regimen     NUTRITION DIAGNOSIS:  Moderate Malnutrition related to chronic illness as evidenced by moderate muscle depletion, percent weight loss (15% in 6 months). - remains applicable   GOAL:  Patient will meet greater than or equal to 90% of their needs - meeting via TF  Blair Deaner MS, RD, LDN Registered Dietitian Clinical Nutrition RD Inpatient Contact Info in Amion

## 2024-11-10 NOTE — Plan of Care (Signed)
" °  Problem: Clinical Measurements: Goal: Ability to maintain clinical measurements within normal limits will improve Outcome: Progressing Goal: Will remain free from infection Outcome: Progressing Goal: Diagnostic test results will improve Outcome: Progressing Goal: Respiratory complications will improve Outcome: Progressing Goal: Cardiovascular complication will be avoided Outcome: Progressing   Problem: Nutrition: Goal: Adequate nutrition will be maintained Outcome: Progressing   Problem: Coping: Goal: Level of anxiety will decrease Outcome: Progressing   Problem: Elimination: Goal: Will not experience complications related to bowel motility Outcome: Progressing Goal: Will not experience complications related to urinary retention Outcome: Progressing   Problem: Pain Managment: Goal: General experience of comfort will improve and/or be controlled Outcome: Progressing   Problem: Safety: Goal: Ability to remain free from injury will improve Outcome: Progressing   Problem: Skin Integrity: Goal: Risk for impaired skin integrity will decrease Outcome: Progressing   Problem: Activity: Goal: Ability to tolerate increased activity will improve Outcome: Progressing   Problem: Respiratory: Goal: Ability to maintain a clear airway and adequate ventilation will improve Outcome: Progressing   Problem: Nutrition: Goal: Risk of aspiration will decrease Outcome: Progressing   "

## 2024-11-11 ENCOUNTER — Inpatient Hospital Stay (HOSPITAL_COMMUNITY)

## 2024-11-11 DIAGNOSIS — Z8719 Personal history of other diseases of the digestive system: Secondary | ICD-10-CM | POA: Diagnosis not present

## 2024-11-11 DIAGNOSIS — Z9889 Other specified postprocedural states: Secondary | ICD-10-CM | POA: Diagnosis not present

## 2024-11-11 LAB — CBC WITH DIFFERENTIAL/PLATELET
Abs Immature Granulocytes: 0.08 K/uL — ABNORMAL HIGH (ref 0.00–0.07)
Basophils Absolute: 0 K/uL (ref 0.0–0.1)
Basophils Relative: 0 %
Eosinophils Absolute: 0.1 K/uL (ref 0.0–0.5)
Eosinophils Relative: 2 %
HCT: 26.2 % — ABNORMAL LOW (ref 36.0–46.0)
Hemoglobin: 8.4 g/dL — ABNORMAL LOW (ref 12.0–15.0)
Immature Granulocytes: 1 %
Lymphocytes Relative: 14 %
Lymphs Abs: 0.9 K/uL (ref 0.7–4.0)
MCH: 30.8 pg (ref 26.0–34.0)
MCHC: 32.1 g/dL (ref 30.0–36.0)
MCV: 96 fL (ref 80.0–100.0)
Monocytes Absolute: 0.9 K/uL (ref 0.1–1.0)
Monocytes Relative: 13 %
Neutro Abs: 4.7 K/uL (ref 1.7–7.7)
Neutrophils Relative %: 70 %
Platelets: 536 K/uL — ABNORMAL HIGH (ref 150–400)
RBC: 2.73 MIL/uL — ABNORMAL LOW (ref 3.87–5.11)
RDW: 16.4 % — ABNORMAL HIGH (ref 11.5–15.5)
WBC: 6.7 K/uL (ref 4.0–10.5)
nRBC: 0 % (ref 0.0–0.2)

## 2024-11-11 LAB — COMPREHENSIVE METABOLIC PANEL WITH GFR
ALT: 12 U/L (ref 0–44)
AST: 18 U/L (ref 15–41)
Albumin: 3.4 g/dL — ABNORMAL LOW (ref 3.5–5.0)
Alkaline Phosphatase: 83 U/L (ref 38–126)
Anion gap: 13 (ref 5–15)
BUN: 50 mg/dL — ABNORMAL HIGH (ref 8–23)
CO2: 22 mmol/L (ref 22–32)
Calcium: 9.2 mg/dL (ref 8.9–10.3)
Chloride: 102 mmol/L (ref 98–111)
Creatinine, Ser: 1.56 mg/dL — ABNORMAL HIGH (ref 0.44–1.00)
GFR, Estimated: 36 mL/min — ABNORMAL LOW
Glucose, Bld: 104 mg/dL — ABNORMAL HIGH (ref 70–99)
Potassium: 4.5 mmol/L (ref 3.5–5.1)
Sodium: 136 mmol/L (ref 135–145)
Total Bilirubin: 0.2 mg/dL (ref 0.0–1.2)
Total Protein: 5.9 g/dL — ABNORMAL LOW (ref 6.5–8.1)

## 2024-11-11 LAB — GLUCOSE, CAPILLARY
Glucose-Capillary: 108 mg/dL — ABNORMAL HIGH (ref 70–99)
Glucose-Capillary: 112 mg/dL — ABNORMAL HIGH (ref 70–99)
Glucose-Capillary: 117 mg/dL — ABNORMAL HIGH (ref 70–99)
Glucose-Capillary: 119 mg/dL — ABNORMAL HIGH (ref 70–99)
Glucose-Capillary: 125 mg/dL — ABNORMAL HIGH (ref 70–99)
Glucose-Capillary: 127 mg/dL — ABNORMAL HIGH (ref 70–99)
Glucose-Capillary: 133 mg/dL — ABNORMAL HIGH (ref 70–99)
Glucose-Capillary: 89 mg/dL (ref 70–99)

## 2024-11-11 LAB — MAGNESIUM: Magnesium: 2.2 mg/dL (ref 1.7–2.4)

## 2024-11-11 LAB — PHOSPHORUS: Phosphorus: 4.4 mg/dL (ref 2.5–4.6)

## 2024-11-11 MED ORDER — FREE WATER
200.0000 mL | Freq: Four times a day (QID) | Status: DC
  Administered 2024-11-11 – 2024-11-25 (×50): 200 mL

## 2024-11-11 NOTE — Progress Notes (Signed)
 Occupational Therapy Treatment Patient Details Name: Erin [name] MRN: [id number] DOB: Mar 20, 1956 Today's Date: 11/11/2024   History of present illness 68 y.o. F  adm to [facility] 12/3 for ventral hernia repair. 12/4 ex lap with abdominal hemorrhage and remained intubated post op. 12/5 abdominal closure. 12/8 MRI with multiple embolic infarcts. 12/9 transfer [facility]. 12/11 extubated. PMHx: anal CA, hypothyroidism, GERD, CAD, cardiomyopathy, PVD, HLD, HTN, RL.   OT comments  Pt lethargic on arrival needing increased time to achieve alert state. Pt flat in affect, but with good mood state, following one step commands with up to one minute to follow commands. Pt able to static sit with min A-CGA. Engaged in BLE therapeutic exercise of BLE kicks/quad sets at EOB. Pt brushing hair with significantly increased time and rest breaks with min A for sitting balance. Continue to recommend inpatient post-acute follow up.       If plan is discharge home, recommend the following:  Other (comment) (max-total)   Equipment Recommendations  Hospital bed;Hoyer lift;Wheelchair (measurements OT);Wheelchair cushion (measurements OT)    Recommendations for Other Services      Precautions / Restrictions Precautions Precautions: Fall Recall of Precautions/Restrictions: Impaired Precaution/Restrictions Comments: cortrak Restrictions Weight Bearing Restrictions Per Provider Order: No       Mobility Bed Mobility Overal bed mobility: Needs Assistance Bed Mobility: Rolling, Sidelying to Sit, Sit to Sidelying Rolling: Max assist Sidelying to sit: Max assist, Used rails     Sit to sidelying: Mod assist      Transfers                         Balance Overall balance assessment: Needs assistance Sitting-balance support: Bilateral upper extremity supported, Feet supported Sitting balance-Leahy Scale: Poor                                     ADL either performed or assessed  with clinical judgement   ADL Overall ADL's : Needs assistance/impaired     Grooming: Brushing hair;Minimal assistance;Moderate assistance;Sitting Grooming Details (indicate cue type and reason): min-mod A for sitting balance while pt brushing hair; needed ~1 minute to initiate                                    Extremity/Trunk Assessment              Vision       Perception     Praxis     Communication Communication Communication: Impaired Factors Affecting Communication: Difficulty expressing self;Reduced clarity of speech   Cognition Arousal: Lethargic, Alert Behavior During Therapy: WFL for tasks assessed/performed Cognition: Difficult to assess Difficult to assess due to: Level of arousal           OT - Cognition Comments: one verbalization this session, intermittently nods appropriately but not consistently                 Following commands: Impaired Following commands impaired: Follows one step commands inconsistently, Follows one step commands with increased time      Cueing   Cueing Techniques: Verbal cues, Tactile cues, Visual cues  Exercises      Shoulder Instructions       General Comments VSS    Pertinent Vitals/ Pain       Pain Assessment Pain Assessment:  No/denies pain  Home Living                                          Prior Functioning/Environment              Frequency  Min 2X/week        Progress Toward Goals  OT Goals(current goals can now be found in the care plan section)  Progress towards OT goals: Progressing toward goals     Plan      Co-evaluation                 AM-PAC OT 6 Clicks Daily Activity     Outcome Measure   Help from another person eating meals?: Total Help from another person taking care of personal grooming?: A Lot Help from another person toileting, which includes using toliet, bedpan, or urinal?: Total Help from another person bathing  (including washing, rinsing, drying)?: Total Help from another person to put on and taking off regular upper body clothing?: Total Help from another person to put on and taking off regular lower body clothing?: Total 6 Click Score: 7    End of Session    OT Visit Diagnosis: Muscle weakness (generalized) (M62.81);Other symptoms and signs involving cognitive function;Cognitive communication deficit (R41.841) Symptoms and signs involving cognitive functions: Cerebral infarction   Activity Tolerance Patient tolerated treatment well   Patient Left in bed;with call bell/phone within reach;with bed alarm set;with family/visitor present   Nurse Communication Mobility status        Time: 8363-8340 OT Time Calculation (min): 23 min  Charges: OT General Charges $OT Visit: 1 Visit OT Treatments $Self Care/Home Management : 23-37 mins  Erin [name], OTR/L [facility] Acute Rehabilitation Office: [phone]   Erin [name] 11/11/2024, 5:15 PM

## 2024-11-11 NOTE — Plan of Care (Signed)
" °  Problem: SLP Dysphagia Goals Goal: Patient will demonstrate readiness for PO's Description: Patient will demonstrate readiness for PO's and/or instrumental swallow study as evidenced by: Outcome: Completed/Met Flowsheets (Taken 11/01/2024 1558) Patient will demonstrate readiness for PO's and/or instrumental swallow study as evidenced by:  ability to manage secretions  initiation of swallow sequence   "

## 2024-11-11 NOTE — Progress Notes (Signed)
 " PROGRESS NOTE    Erin [name]  FMW:[id number] DOB: [birth year] DOA: 10/23/2024 PCP: [name] [name], MD   Brief Narrative:  68 year old woman who presented to [facility] 12/3 for elective hernia repair. 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  Patient admitted initially for postop hernia repair found to have hemoperitoneum with hemorrhagic shock postoperatively requiring intubation and pressors.  Her course was subsequently complicated by bilateral strokes thought to be provoked by hypotension.  At this time patient is stabilizing, she has been extubated, continues tube feeds via NG and is otherwise stable from a surgical standpoint.  Transition out of the ICU to hospitalist team on 12/15 with plan for ongoing care with ultimate plan for disposition pending patient's clinical course.  Hospital course 12/3 admitted to ICU postop 12/3-post hernia repair 12/4 taken back to the OR for hemoperitoneum-active bleed was not identified, abdomen packed with wound VAC in place 12/5 back to the OR for an open abdomen-no evidence of ongoing bleeding and abdomen was closed 12/8 - CT Head with interval development (since 04/2024) of multiple cortical and subcortical hypodensities in bilateral cerebellar and cerebral hemispheres suggestive of acute/subacute infarcts. MRI Brain with numerous areas of restricted diffusion throughout the cerebral hemispheres/cerebellum c/f acute infarcts of embolic etiology, associated mild edema (R occipital lobe, L temporal lobe).  12/11 tolerating PSV 5/5 >> extubated 12/15 transition to TRH team - goals of care discussion with husband(see below) 12/17 General surgery evaluating for PEG -pending SLP re-evaluation in hopes she will be able to  improve -LTAC was denied by insurance  Assessment & Plan:    Hemorrhagic shock from hypovolemia caused during ex lap hernia repair SHOCK - required stay in ICU, pressors, received massive blood transfusion protocol with 5 units of packed RBC and 2 units of cryoprecipitate around 10/23/2024, unfortunately incurred anoxic brain injury/CVA, brief support with intubation.  Shock has resolved, monitor with supportive care.  Acute bilateral cerebral and cerebellar infarcts, causing encephalopathy, aphasia, dysphagia - CT head with multiple cortical and subcortical hypodensities, confirmed by MRI, likely due to profound hypotension from #1 above, seen by neuro, patient remains minimally verbal, has dysphagia requiring NG tube feeds, may require peg tube, speech PT OT working, fortunately there is significant improvement in her mentation and cough reflex on 11/11/2024, speech therapy informed, continue to monitor closely. Currently on aspirin .  Seen by stroke team.  Continue aspirin , stable A1c and LDL.  Stable EEG.  Echocardiogram with preserved EF of 60%.  MRI stable.    HTN  - Continue Amlodipine , Carvedilol , added hydralazine  for better blood pressure control, continue as needed IV hydralazine .    S/p hernia repair, elective 12/3, S/p ex-lap 12/4, 12/5  - Postoperative management per CCS,  Wound care per surgery   Acute kidney injury to ATN from shock.  History of left renal artery stenosis.  Renal function improving.  Monitor   Acute respiratory insufficiency , extubated 12/11 - happened due to anoxic brain injury/CVA.  Resolved.  HCAP - Enterobacter 12/6  - Completed antibiotic treatment.  History of peripheral neuropathy and itching.  Usually takes high doses of Neurontin  at home, cut down as she has some encephalopathy on 11/09/2024, reduce sedative medications  Mild metabolic encephalopathy.  See above.  Reduce sedating medications including Neurontin .  DVT prophylaxis: heparin  injection  5,000  Units Start: 10/28/24 2200 SCD's Start: 10/23/24 1918 Code Status:   Code Status: Full Code Family Communication: Husband at bedside on 11/07/2024, 11/08/2024, 11/09/2024, 11/10/2024, 11/11/2024  Status is: Inpt  Dispo: LTAC  Consultants:  PCCM, General surgery, Neuro  Procedures:  Hernia repair 12/3 and ex-lap 12/5   Antimicrobials:  None(completed prior course)   Subjective: Patient in bed much more awake alert, denies any headache chest or abdominal pain.  Following commands moving all 4 extremities.  Coughing on command.    Objective: Vitals:   11/11/24 0346 11/11/24 0400 11/11/24 0505 11/11/24 0800  BP:  127/78 127/78 (!) 146/68  Pulse:  95  (!) 103  Resp:  15  (!) 21  Temp:  98.1 F (36.7 C)  98.1 F (36.7 C)  TempSrc:  Axillary    SpO2:  95%  95%  Weight: 44.5 kg     Height:        Intake/Output Summary (Last 24 hours) at 11/11/2024 1028 Last data filed at 11/11/2024 0505 Gross per 24 hour  Intake 1160 ml  Output 800 ml  Net 360 ml   Filed Weights   11/09/24 0500 11/10/24 0500 11/11/24 0346  Weight: 47.3 kg 44.3 kg 44.5 kg    Examination:  Awake but remains nonverbal, does open eyes to loud commands from time to time but inconsistent, withdraws to pain, NG tube in place, [place].AT,PERRAL Supple Neck, No JVD,   Symmetrical Chest wall movement, Good air movement bilaterally, CTAB RRR,No Gallops, Rubs or new Murmurs,  +ve B.Sounds, Abd Soft, No tenderness, midline abdominal postop scar stable under bandage No Cyanosis, Clubbing or edema      Data Review:   Patient Lines/Drains/Airways Status     Active Line/Drains/Airways     Name Placement date Placement time Site Days   Peripheral IV 10/23/24 20 G 1.75 Anterior;Left Forearm 10/23/24  2037  Forearm  19   External Urinary Catheter 11/01/24  1900  --  10   Small Bore Feeding Tube Left nare Marking at nare/corner of mouth 68 cm 11/01/24  1536  Left nare  10   Wound 10/23/24 0923 Surgical Closed  Surgical Incision Abdomen 10/23/24  0923  Abdomen  19   Wound 10/23/24 0923 Surgical Laparoscopic Abdomen 1: Upper;Left 2: Left;Lower 10/23/24  0923  Abdomen  19   Wound 11/01/24 1600 Irritant Contact Dermatitis Buttocks 11/01/24  1600  Buttocks  10             Inpatient Medications  Scheduled Meds:  amLODipine   10 mg Per Tube Daily   aspirin   81 mg Per Tube Daily   carvedilol   12.5 mg Per Tube BID WC   Chlorhexidine  Gluconate Cloth  6 each Topical Daily   feeding supplement (PROSource TF20)  60 mL Per Tube Daily   free water   200 mL Per Tube Q6H   gabapentin   100 mg Per Tube Q8H   Gerhardt's butt cream   Topical BID   heparin  injection (subcutaneous)  5,000 Units Subcutaneous Q8H   hydrALAZINE   50 mg Per Tube Q8H   Influenza vac split trivalent PF  0.5 mL Intramuscular Tomorrow-1000   levothyroxine   75 mcg Per Tube Q0600   methylphenidate   5 mg Oral q morning   mouth rinse  15 mL Mouth Rinse 4 times per day   pantoprazole  (PROTONIX ) IV  40 mg Intravenous QHS   Continuous Infusions:  feeding supplement (KATE FARMS STANDARD 1.4) Liquid 1,000 mL (11/10/24 1714)  PRN Meds:.acetaminophen , hydrALAZINE , labetalol , ondansetron  **OR** ondansetron  (ZOFRAN ) IV, mouth rinse, traMADol   DVT Prophylaxis  heparin  injection 5,000 Units Start: 10/28/24 2200 SCD's Start: 10/23/24 1918       Recent Labs  Lab 11/07/24 0719 11/08/24 0437 11/09/24 0453 11/10/24 0408 11/11/24 0334  WBC 8.4 10.7* 7.4 6.7 6.7  HGB 8.5* 8.6* 8.0* 8.4* 8.4*  HCT 27.1* 26.0* 24.7* 26.1* 26.2*  PLT 609* 631* 577* 558* 536*  MCV 96.8 95.2 95.7 96.3 96.0  MCH 30.4 31.5 31.0 31.0 30.8  MCHC 31.4 33.1 32.4 32.2 32.1  RDW 16.1* 16.1* 16.6* 16.9* 16.4*  LYMPHSABS 0.7 1.0 0.9 1.0 0.9  MONOABS 0.8 0.9 0.8 0.9 0.9  EOSABS 0.1 0.1 0.1 0.1 0.1  BASOSABS 0.0 0.0 0.0 0.0 0.0    Recent Labs  Lab 11/07/24 0719 11/08/24 0437 11/09/24 0453 11/10/24 0408 11/11/24 0334  NA 138 136 137 136 136  K 3.9 4.0  4.4 4.9 4.5  CL 108 106 106 104 102  CO2 20* 18* 21* 21* 22  ANIONGAP 10 12 10 11 13   GLUCOSE 112* 105* 111* 105* 104*  BUN 36* 38* 40* 47* 50*  CREATININE 1.32* 1.28* 1.43* 1.60* 1.56*  AST 19 19 16 17 18   ALT 12 11 8 10 12   ALKPHOS 89 87 84 83 83  BILITOT <0.2 <0.2 <0.2 <0.2 0.2  ALBUMIN  3.2* 3.1* 3.2* 3.2* 3.4*  MG 1.7 1.9 1.9 2.1 2.2  PHOS 4.4 4.2 5.0* 5.0* 4.4  CALCIUM  8.8* 8.8* 8.7* 9.0 9.2      Recent Labs  Lab 11/07/24 0719 11/08/24 0437 11/09/24 0453 11/10/24 0408 11/11/24 0334  MG 1.7 1.9 1.9 2.1 2.2  CALCIUM  8.8* 8.8* 8.7* 9.0 9.2    --------------------------------------------------------------------------------------------------------------- Lab Results  Component Value Date   CHOL 91 10/29/2024   HDL 12 (L) 10/29/2024   LDLCALC 13 10/29/2024   LDLDIRECT 70.0 04/07/2021   TRIG 331 (H) 10/29/2024   CHOLHDL 7.6 10/29/2024    Lab Results  Component Value Date   HGBA1C 4.9 10/18/2024   No results for input(s): TSH, T4TOTAL, FREET4, T3FREE, THYROIDAB in the last 72 hours. No results for input(s): VITAMINB12, FOLATE, FERRITIN, TIBC, IRON, RETICCTPCT in the last 72 hours. ------------------------------------------------------------------------------------------------------------------ Cardiac Enzymes No results for input(s): CKMB, TROPONINI, MYOGLOBIN in the last 168 hours.  Invalid input(s): CK  Micro Results Recent Results (from the past 240 hours)  Gastrointestinal Panel by PCR , Stool     Status: None   Collection Time: 11/02/24  4:03 AM   Specimen: Stool  Result Value Ref Range Status   Campylobacter species NOT DETECTED NOT DETECTED Final   Plesimonas shigelloides NOT DETECTED NOT DETECTED Final   Salmonella species NOT DETECTED NOT DETECTED Final   Yersinia enterocolitica NOT DETECTED NOT DETECTED Final   Vibrio species NOT DETECTED NOT DETECTED Final   Vibrio cholerae NOT DETECTED NOT DETECTED Final    Enteroaggregative E coli (EAEC) NOT DETECTED NOT DETECTED Final   Enteropathogenic E coli (EPEC) NOT DETECTED NOT DETECTED Final   Enterotoxigenic E coli (ETEC) NOT DETECTED NOT DETECTED Final   Shiga like toxin producing E coli (STEC) NOT DETECTED NOT DETECTED Final   Shigella/Enteroinvasive E coli (EIEC) NOT DETECTED NOT DETECTED Final   Cryptosporidium NOT DETECTED NOT DETECTED Final   Cyclospora cayetanensis NOT DETECTED NOT DETECTED Final   Entamoeba histolytica NOT DETECTED NOT DETECTED Final   Giardia lamblia NOT DETECTED NOT DETECTED Final   Adenovirus F40/41 NOT DETECTED NOT DETECTED Final  Astrovirus NOT DETECTED NOT DETECTED Final   Norovirus GI/GII NOT DETECTED NOT DETECTED Final   Rotavirus A NOT DETECTED NOT DETECTED Final   Sapovirus (I, II, IV, and V) NOT DETECTED NOT DETECTED Final    Comment: Performed at [facility], [address] [place], [place] [address]  C Difficile Quick Screen (NO PCR Reflex)     Status: None   Collection Time: 11/02/24 12:23 PM   Specimen: STOOL  Result Value Ref Range Status   C Diff antigen NEGATIVE NEGATIVE Final   C Diff toxin NEGATIVE NEGATIVE Final   C Diff interpretation No C. difficile detected.  Final    Comment: Performed at [facility], [address], [place], [place] [address]    Radiology Reports  No results found.     Signature  -   Lavada Stank M.D on 11/11/2024 at 10:28 AM   -  To page go to [email or web address]     "

## 2024-11-11 NOTE — Progress Notes (Signed)
 Speech Language Pathology Treatment: Dysphagia  Patient Details Name: Erin [name] MRN: [id number] DOB: 10/06/1956 Today's Date: 11/11/2024 Time: 9064-9043 SLP Time Calculation (min) (ACUTE ONLY): 21 min  Assessment / Plan / Recommendation Clinical Impression  Pt alert and upright in chair. She is incredible more responsive and engaged today compared to prior sessions. She occasionally exhibited some automatic verbal responses and smiled and laughed and SLP appropriately at SLP jokes.   Plan for MBSS today at 1330 to re-evaluate oropharyngeal swallow function and make safest diet recommendation as able.   Today, pt consumed ice chips x3-4, cup sips of water  (1-2 oz), straw sips of water  (~1oz) and applesauce (4 oz). Immediate coughing incident observed after straw sips of water  only. No other overt or subtle s/s of aspiration noted. Oral prep and transit observed to be efficient for trials provided. Pharyngeal swallow initiation appeared prompt with laryngeal elevation noted.   Reviewed recommendations to proceed with repeat MBSS with pt and her husband. They are in agreement with the plan. MBSS anticipated later today.    HPI HPI: 68 yo F adm to [facility] 12/3 for ventral hernia repair. 12/4 ex lap with abd hemorrhage, remained intubated post op. 12/5 abdominal closure. 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 12/9 transfer to [facility]. 12/11 extubated. BSE 11/01/24: NPO, ice chips after oral care. MBS 12/17: silent aspiration, DNFC; NPO. Significant issues with attn/cog. SLP following for cognitive-linguistic deficits and dysphagia. PMHx: anal CA, hypothyroidism, GERD, CAD, cardiomyopathy, PVD, HLD, HTN, RLS. Per 2023 MBS, mild pharyngeal dysphagia and suspected primary esophageal dysphagia.      SLP Plan  Continue with current plan of care         Recommendations  Diet recommendations: NPO (ice chips) Liquids provided via: Teaspoon Medication Administration: Via alternative  means Supervision: Full supervision/cueing for compensatory strategies;Trained caregiver to feed patient Compensations: Slow rate;Small sips/bites Postural Changes and/or Swallow Maneuvers: Seated upright 90 degrees;Upright 30-60 min after meal                  Oral care QID;Oral care prior to ice chip/H20   Frequent or constant Supervision/Assistance Dysphagia, pharyngeal phase (R13.13);Cognitive communication deficit (M58.158)     Continue with current plan of care     [name]  11/11/2024, 10:51 AM

## 2024-11-11 NOTE — Procedures (Signed)
 Modified Barium Swallow Study  Patient Details  Name: ARVADA SEABORN MRN: [id number] Date of Birth: 08-Apr-1956  Today's Date: 11/11/2024  Modified Barium Swallow completed.  Full report located under Chart Review in the Imaging Section.  History of Present Illness 68 yo F adm to [facility] 12/3 for ventral hernia repair. 12/4 ex lap with abd hemorrhage, remained intubated post op. 12/5 abdominal closure. 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 12/9 transfer to [facility]. 12/11 extubated. BSE 11/01/24: NPO, ice chips after oral care. MBS 12/17: silent aspiration, DNFC; NPO. Significant issues with attn/cog. SLP following for cognitive-linguistic deficits and dysphagia. PMHx: anal CA, hypothyroidism, GERD, CAD, cardiomyopathy, PVD, HLD, HTN, RLS. Per 2023 MBS, mild pharyngeal dysphagia and suspected primary esophageal dysphagia. MBSS completed on 11/11/24 showed mild oropharygneal dysphagia with recommendations of a full liquid/thin liquid diet.   Clinical Impression  Pt presents with a mild oropharyngeal dysphagia per results of MBSS completed today. No aspiration observed; however, there was deep penetration of thin liquid residue to the level of the vocal cords, which increases pt's risk of aspiration. Recommend initiation of a full liquids, thin liquids diet. SLP will follow to assess diet tolerance and clinically advance diet as indicated. Consider a repeat MBSS once Cortrak is removed.   Oral deficits characterized by increased oral prep and transit time with soft solid. Pt able to clear bolus with additional time.   Pharyngeal deficits characterized by reduced hyolaryngeal elevation/excursion, reduced base of tongue retraction, incomplete epiglottic inversion, reduced laryngeal vestibule closure, and reduced pharyngeal stripping.   Findings -No aspiration observed across trials.  -There was transient and stagnant penetration of thin liquids and nectar-thick liquids during and after the swallow. Pt  eventually cleared penetrated residue with cued swallow or cued throat clear (followed direction x1 occurrence).  -There was min-moderate vallecular and pyriform residue following the majority of residue. Amount of residue appeared to increased as the viscosity increased.  *some frames were not recorded due to equipment issue.   Detailed diet recommendations and aspiration precautions outlined below. Continue SLP PoC.   Factors that may increase risk of adverse event in presence of aspiration Noe & Lianne 2021): Reduced cognitive function;Frail or deconditioned;Dependence for feeding and/or oral hygiene  Swallow Evaluation Recommendations Recommendations: PO diet PO Diet Recommendation: Full liquid diet;Thin liquids (Level 0) Liquid Administration via: Cup;No straw Medication Administration: Crushed with puree Supervision: Staff to assist with self-feeding;Full supervision/cueing for swallowing strategies Swallowing strategies  : Minimize environmental distractions;Slow rate;Small bites/sips;Multiple dry swallows after each bite/sip Postural changes: Position pt fully upright for meals;Stay upright 30-60 min after meals Oral care recommendations: Oral care BID (2x/day)      [name] 11/11/2024,2:49 PM

## 2024-11-11 NOTE — Progress Notes (Signed)
 Physical Therapy Treatment Patient Details Name: Erin [name] MRN: [id number] DOB: 27-Dec-1955 Today's Date: 11/11/2024   History of Present Illness 68 y.o. F  adm to [facility] 12/3 for ventral hernia repair. 12/4 ex lap with abdominal hemorrhage and remained intubated post op. 12/5 abdominal closure. 12/8 MRI with multiple embolic infarcts. 12/9 transfer to [facility]. 12/11 extubated. PMHx: anal CA, hypothyroidism, GERD, CAD, cardiomyopathy, PVD, HLD, HTN, RL.    PT Comments  Pt received in supine and demonstrates good progress towards functional goals. Pt appears more alert with improved initiation this session. Pt continues to require mod-max A +2 due to weakness and quick fatigue, but follows ~75% of simple commands. Pt able to reach brief periods of CGA sitting EOB and tolerate multiple standing trials this session. Pt noted to be incontinent of bowel during transfer to the recliner and is able to stand for pericare with up to max A due to fatigue. Pt continues to benefit from PT services to progress toward functional mobility goals.    If plan is discharge home, recommend the following: A lot of help with bathing/dressing/bathroom;A lot of help with walking and/or transfers;Assistance with cooking/housework;Assistance with feeding;Direct supervision/assist for medications management;Direct supervision/assist for financial management;Assist for transportation;Help with stairs or ramp for entrance;Supervision due to cognitive status   Can travel by private vehicle     No  Equipment Recommendations  Rolling walker (2 wheels);BSC/3in1;Wheelchair (measurements PT);Wheelchair cushion (measurements PT);Hospital bed;Hoyer lift    Recommendations for Other Services       Precautions / Restrictions Precautions Precautions: Fall Recall of Precautions/Restrictions: Impaired Precaution/Restrictions Comments: cortrak Restrictions Weight Bearing Restrictions Per Provider Order: No     Mobility  Bed  Mobility Overal bed mobility: Needs Assistance Bed Mobility: Supine to Sit     Supine to sit: Mod assist     General bed mobility comments: Pt initiated BLE advancement and trunk elevation, but requires mod A to complete    Transfers Overall transfer level: Needs assistance Equipment used: Ambulation equipment used Transfers: Sit to/from Stand, Bed to chair/wheelchair/BSC Sit to Stand: Max assist, +2 safety/equipment           General transfer comment: STS from EOB and recliner with max A for rise and cues for hand placement. B knees remain slightly flexed and pt with quick fatigue. Cues for hand placement and safety throughout transfer Transfer via Lift Equipment: Stedy  Ambulation/Gait                   Stairs             Wheelchair Mobility     Tilt Bed    Modified Rankin (Stroke Patients Only) Modified Rankin (Stroke Patients Only) Pre-Morbid Rankin Score: No symptoms Modified Rankin: Severe disability     Balance Overall balance assessment: Needs assistance Sitting-balance support: Bilateral upper extremity supported, Feet supported   Sitting balance - Comments: Pt demonstrates instability in all directions requiring up to max A to maintain balance, but progressing to brief periods of CGA with cues   Standing balance support: Bilateral upper extremity supported, During functional activity, Reliant on assistive device for balance Standing balance-Leahy Scale: Zero Standing balance comment: reliant on stedy and external assist                            Communication Communication Communication: Impaired Factors Affecting Communication: Difficulty expressing self;Reduced clarity of speech  Cognition Arousal: Alert Behavior During Therapy: [facility] for  tasks assessed/performed   PT - Cognitive impairments: Awareness, Initiation, Sequencing, Problem solving, Difficult to assess, Attention                       PT - Cognition  Comments: Improved verbal communication towards end of the session. Followed most simple commands Following commands: Impaired Following commands impaired: Follows one step commands inconsistently, Follows one step commands with increased time    Cueing Cueing Techniques: Verbal cues, Tactile cues, Visual cues  Exercises      General Comments        Pertinent Vitals/Pain Pain Assessment Pain Assessment: Faces Faces Pain Scale: No hurt     PT Goals (current goals can now be found in the care plan section) Acute Rehab PT Goals Patient Stated Goal: No additional goals stated during session. PT Goal Formulation: With family Time For Goal Achievement: 11/13/24 Progress towards PT goals: Progressing toward goals    Frequency    Min 2X/week       AM-PAC PT 6 Clicks Mobility   Outcome Measure  Help needed turning from your back to your side while in a flat bed without using bedrails?: A Lot Help needed moving from lying on your back to sitting on the side of a flat bed without using bedrails?: A Lot Help needed moving to and from a bed to a chair (including a wheelchair)?: Total Help needed standing up from a chair using your arms (e.g., wheelchair or bedside chair)?: Total Help needed to walk in hospital room?: Total Help needed climbing 3-5 steps with a railing? : Total 6 Click Score: 8    End of Session Equipment Utilized During Treatment: Gait belt Activity Tolerance: Patient tolerated treatment well;Patient limited by fatigue Patient left: with call bell/phone within reach;in chair;with chair alarm set Nurse Communication: Mobility status PT Visit Diagnosis: Other abnormalities of gait and mobility (R26.89);Muscle weakness (generalized) (M62.81) Hemiplegia - Right/Left: Left Hemiplegia - dominant/non-dominant: Non-dominant Hemiplegia - caused by: Cerebral infarction     Time: 9143-9069 PT Time Calculation (min) (ACUTE ONLY): 34 min  Charges:     $Therapeutic Activity: 23-37 mins PT General Charges $$ ACUTE PT VISIT: 1 Visit                    Darryle George, PTA Acute Rehabilitation Services Secure Chat Preferred  [phone]    Darryle George 11/11/2024, 11:03 AM

## 2024-11-11 NOTE — Progress Notes (Signed)
 17 Days Post-Op   Subjective/Chief Complaint: Interacting well this AM Following command and responding appropriately   Objective: Vital signs in last 24 hours: Temp:  [97.8 F (36.6 C)-98.3 F (36.8 C)] 98.1 F (36.7 C) (12/22 0400) Pulse Rate:  [88-103] 103 (12/22 0800) Resp:  [15-21] 21 (12/22 0800) BP: (127-150)/(54-78) 146/68 (12/22 0800) SpO2:  [94 %-96 %] 95 % (12/22 0800) Weight:  [44.5 kg] 44.5 kg (12/22 0346) Last BM Date : 11/08/24  Intake/Output from previous day: 12/21 0701 - 12/22 0700 In: 1160 [NG/GT:1160] Out: 1150 [Urine:1150] Intake/Output this shift: No intake/output data recorded.  PE:  Constitutional: No acute distress, conversant, appears states age. Eyes: Anicteric sclerae, moist conjunctiva, no lid lag Lungs: Clear to auscultation bilaterally, normal respiratory effort CV: regular rate and rhythm, no murmurs, no peripheral edema, pedal pulses 2+ GI: Soft, no masses or hepatosplenomegaly, non-tender to palpation Skin: No rashes, palpation reveals normal turgor Neuro: following commands and speaking   Lab Results:  Recent Labs    11/10/24 0408 11/11/24 0334  WBC 6.7 6.7  HGB 8.4* 8.4*  HCT 26.1* 26.2*  PLT 558* 536*   BMET Recent Labs    11/10/24 0408 11/11/24 0334  NA 136 136  K 4.9 4.5  CL 104 102  CO2 21* 22  GLUCOSE 105* 104*  BUN 47* 50*  CREATININE 1.60* 1.56*  CALCIUM  9.0 9.2   Studies/Results: No results found.  Anti-infectives: Anti-infectives (From admission, onward)    Start     Dose/Rate Route Frequency Ordered Stop   11/01/24 1200  piperacillin -tazobactam (ZOSYN ) IVPB 3.375 g        3.375 g 12.5 mL/hr over 240 Minutes Intravenous Every 8 hours 11/01/24 0822 11/04/24 0048   10/30/24 1400  piperacillin -tazobactam (ZOSYN ) IVPB 2.25 g  Status:  Discontinued        2.25 g 100 mL/hr over 30 Minutes Intravenous Every 8 hours 10/30/24 0939 11/01/24 0822   10/28/24 1600  ceFEPIme  (MAXIPIME ) 2 g in sodium chloride  0.9  % 100 mL IVPB  Status:  Discontinued        2 g 200 mL/hr over 30 Minutes Intravenous Every 24 hours 10/28/24 1441 10/30/24 0939   10/26/24 1800  cefTRIAXone  (ROCEPHIN ) 2 g in sodium chloride  0.9 % 100 mL IVPB  Status:  Discontinued        2 g 200 mL/hr over 30 Minutes Intravenous Daily-1800 10/26/24 1739 10/28/24 1441   10/23/24 0700  ceFAZolin  (ANCEF ) IVPB 2g/100 mL premix        2 g 200 mL/hr over 30 Minutes Intravenous On call to O.R. 10/23/24 9351 10/23/24 9166       Assessment/Plan: s/p Procedures with comments: LAPAROTOMY, EXPLORATORY (N/A) - REMOVAL OF PACKING s/p POD 18 from lap VHR and reexploration for bleeding, POD 16 from reexploration and abdominal closure -con't Tf, working with ST and following commands, husb hopes she will pass swallow eval and avoid PEG -dispo planning in process -Will f/u pt sporadically, but surgery team available if needed  LOS: 19 days    Erin [name] 11/11/2024

## 2024-11-11 NOTE — Progress Notes (Signed)
 Speech Language Pathology Treatment: Dysphagia  Patient Details Name: Erin [name] MRN: [id number] DOB: [birth year] Today's Date: 11/11/2024 Time: 8581-8570 SLP Time Calculation (min) (ACUTE ONLY): 11 min  Assessment / Plan / Recommendation Clinical Impression  SLP followed up at bedside to review MBSS results and recommendations with pt and her husband. Written recs provided and reviewed all compensatory swallow strategies. Answered questions re: example liquid/solid items included on full liquid diet. Discussed plan of care and plan to further advance diet as tolerated. Continue SLP PoC.    HPI HPI: 68 yo F adm to [facility] 12/3 for ventral hernia repair. 12/4 ex lap with abd hemorrhage, remained intubated post op. 12/5 abdominal closure. 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 12/9 transfer to [facility]. 12/11 extubated. BSE 11/01/24: NPO, ice chips after oral care. MBS 12/17: silent aspiration, DNFC; NPO. Significant issues with attn/cog. SLP following for cognitive-linguistic deficits and dysphagia. PMHx: anal CA, hypothyroidism, GERD, CAD, cardiomyopathy, PVD, HLD, HTN, RLS. Per 2023 MBS, mild pharyngeal dysphagia and suspected primary esophageal dysphagia. MBSS completed on 11/11/24 showed mild oropharygneal dysphagia with recommendations of a full liquid/thin liquid diet.      SLP Plan  Continue with current plan of care        Swallow Evaluation Recommendations   Recommendations: PO diet PO Diet Recommendation: Full liquid diet;Thin liquids (Level 0) Liquid Administration via: Cup;No straw Medication Administration: Crushed with puree Supervision: Staff to assist with self-feeding;Full supervision/cueing for swallowing strategies Postural changes: Position pt fully upright for meals;Stay upright 30-60 min after meals Oral care recommendations: Oral care BID (2x/day)     Recommendations  Diet recommendations: Thin liquid (Full liquids) Liquids provided via: Cup;No straw Medication  Administration: Crushed with puree Supervision: Full supervision/cueing for compensatory strategies;Trained caregiver to feed patient Compensations: Slow rate;Small sips/bites Postural Changes and/or Swallow Maneuvers: Seated upright 90 degrees;Upright 30-60 min after meal                  Oral care prior to ice chip/H20;Oral care BID   Frequent or constant Supervision/Assistance Cognitive communication deficit (R41.841);Dysphagia, oropharyngeal phase (R13.12)     Continue with current plan of care     Peyton Erin [name]  11/11/2024, 3:20 PM

## 2024-11-11 NOTE — Progress Notes (Addendum)
 Nutrition Brief Note  Patient much more alert today. RN reports she will answer with one word phrases, but still with expressive aphasia. Speech in to see patient with plans for MBS at 1:30PM today. Will monitor for diet advancement and modify tube feeding to cyclic, if diet advanced to assess intake in attempt to wean nutrition support. Family hoping to avoid PEG. Notably, she did have bowel movement this morning. Continue to monitor the need for scheduled bowel regimen.   INTERVENTION:  Continue TF via Cortrak: [place] 1.4 Standard at 40 ml/h (960 ml per day) -Continue Pro-Source TF20 60 mL daily    -Free water  flushes 200 ml q6h (800 ml per day) -Provides 1592 kcal, 87 gm protein, 1567 ml free water  daily (TF + FWF)   Monitor SLP notes for diet advancement/tolerance and ability to wean nutrition support   Monitor GOC discussion and modify plan of care as indicated   Monitor bowels and need for scheduled bowel regimen  NUTRITION DIAGNOSIS:  Moderate Malnutrition related to chronic illness as evidenced by moderate muscle depletion, percent weight loss (15% in 6 months). - remains applicable   GOAL:  Patient will meet greater than or equal to 90% of their needs - meeting via TF  Blair Deaner MS, RD, LDN Registered Dietitian Clinical Nutrition RD Inpatient Contact Info in Amion

## 2024-11-12 DIAGNOSIS — Z8719 Personal history of other diseases of the digestive system: Secondary | ICD-10-CM | POA: Diagnosis not present

## 2024-11-12 DIAGNOSIS — Z9889 Other specified postprocedural states: Secondary | ICD-10-CM | POA: Diagnosis not present

## 2024-11-12 LAB — CBC WITH DIFFERENTIAL/PLATELET
Abs Immature Granulocytes: 0.07 K/uL (ref 0.00–0.07)
Basophils Absolute: 0 K/uL (ref 0.0–0.1)
Basophils Relative: 0 %
Eosinophils Absolute: 0.1 K/uL (ref 0.0–0.5)
Eosinophils Relative: 2 %
HCT: 25.9 % — ABNORMAL LOW (ref 36.0–46.0)
Hemoglobin: 8.5 g/dL — ABNORMAL LOW (ref 12.0–15.0)
Immature Granulocytes: 1 %
Lymphocytes Relative: 16 %
Lymphs Abs: 1.1 K/uL (ref 0.7–4.0)
MCH: 31.6 pg (ref 26.0–34.0)
MCHC: 32.8 g/dL (ref 30.0–36.0)
MCV: 96.3 fL (ref 80.0–100.0)
Monocytes Absolute: 1 K/uL (ref 0.1–1.0)
Monocytes Relative: 14 %
Neutro Abs: 4.5 K/uL (ref 1.7–7.7)
Neutrophils Relative %: 67 %
Platelets: 510 K/uL — ABNORMAL HIGH (ref 150–400)
RBC: 2.69 MIL/uL — ABNORMAL LOW (ref 3.87–5.11)
RDW: 16 % — ABNORMAL HIGH (ref 11.5–15.5)
WBC: 6.8 K/uL (ref 4.0–10.5)
nRBC: 0 % (ref 0.0–0.2)

## 2024-11-12 LAB — GLUCOSE, CAPILLARY
Glucose-Capillary: 112 mg/dL — ABNORMAL HIGH (ref 70–99)
Glucose-Capillary: 112 mg/dL — ABNORMAL HIGH (ref 70–99)
Glucose-Capillary: 121 mg/dL — ABNORMAL HIGH (ref 70–99)
Glucose-Capillary: 121 mg/dL — ABNORMAL HIGH (ref 70–99)
Glucose-Capillary: 108 mg/dL — ABNORMAL HIGH (ref 70–99)
Glucose-Capillary: 122 mg/dL — ABNORMAL HIGH (ref 70–99)

## 2024-11-12 LAB — COMPREHENSIVE METABOLIC PANEL WITH GFR
ALT: 10 U/L (ref 0–44)
AST: 16 U/L (ref 15–41)
Albumin: 3.5 g/dL (ref 3.5–5.0)
Alkaline Phosphatase: 88 U/L (ref 38–126)
Anion gap: 10 (ref 5–15)
BUN: 51 mg/dL — ABNORMAL HIGH (ref 8–23)
CO2: 23 mmol/L (ref 22–32)
Calcium: 9.2 mg/dL (ref 8.9–10.3)
Chloride: 102 mmol/L (ref 98–111)
Creatinine, Ser: 1.48 mg/dL — ABNORMAL HIGH (ref 0.44–1.00)
GFR, Estimated: 38 mL/min — ABNORMAL LOW
Glucose, Bld: 103 mg/dL — ABNORMAL HIGH (ref 70–99)
Potassium: 4.7 mmol/L (ref 3.5–5.1)
Sodium: 135 mmol/L (ref 135–145)
Total Bilirubin: 0.3 mg/dL (ref 0.0–1.2)
Total Protein: 6.1 g/dL — ABNORMAL LOW (ref 6.5–8.1)

## 2024-11-12 MED ORDER — GABAPENTIN 250 MG/5ML PO SOLN
100.0000 mg | Freq: Two times a day (BID) | ORAL | Status: DC
  Administered 2024-11-12 – 2024-11-25 (×25): 100 mg
  Filled 2024-11-12 (×16): qty 2

## 2024-11-12 MED ORDER — ENSURE PLUS HIGH PROTEIN PO LIQD
237.0000 mL | Freq: Two times a day (BID) | ORAL | Status: DC
  Administered 2024-11-13 – 2024-11-14 (×3): 237 mL via ORAL

## 2024-11-12 MED ORDER — KATE FARMS STANDARD 1.4 EN LIQD
960.0000 mL | ENTERAL | Status: DC
  Administered 2024-11-13 – 2024-11-17 (×5): 960 mL
  Filled 2024-11-12 (×6): qty 960

## 2024-11-12 MED ORDER — GABAPENTIN 250 MG/5ML PO SOLN: 100.0000 mg | Freq: Three times a day (TID) | ORAL | Status: DC

## 2024-11-12 NOTE — Progress Notes (Signed)
 Nutrition Follow-up  DOCUMENTATION CODES:   Non-severe (moderate) malnutrition in context of chronic illness  INTERVENTION:  Modify TF via Cortrak to cyclic feeding (6PM-10AM): -[place] 1.4 Standard at 60 ml/h (960 ml per day) -Continue Pro-Source TF20 60 mL daily    -Free water  flushes 200 ml q6h (800 ml per day) -Provides 1592 kcal, 87 gm protein, 1567 ml free water  daily (TF + FWF)   Add Ensure Plus High Protein po BID, each supplement provides 350 kcal and 20 grams of protein   Add Magic cup TID with meals, each supplement provides 290 kcal and 9 grams of protein   Monitor SLP notes for diet advancement/tolerance and ability to wean nutrition support   Monitor GOC discussion and modify plan of care as indicated   Monitor bowels and need for scheduled bowel regimen   NUTRITION DIAGNOSIS:  Moderate Malnutrition related to chronic illness as evidenced by moderate muscle depletion, percent weight loss (15% in 6 months).  GOAL:  Patient will meet greater than or equal to 90% of their needs  MONITOR:  Diet advancement, PO intake, TF tolerance  REASON FOR ASSESSMENT:   Consult Enteral/tube feeding initiation and management (Trickle TF)  ASSESSMENT:  68 y.o. female with PMH GERD. IBS, CAD, HTN who presented for elective hernia repair.  12/3 s/p hernia repair; became hypotensive after surgery requiring vasopressors, transferred to the ICU 12/4 returned to OR for ex-lap, abdominal packing and wound vac application; NGT placed; returned to ICU intubated 12/5 returned to OR for open abdomen - no evidence of ongoing bleeding and abdomen closed  12/7 tube feedings started  12/9 transfer to [facility] ICU 12/11 extubated  12/15 transfer to [facility] team - GOC discussion w/ husband: full scope 12/17 transfer out of ICU; MBS: NPO; general surgery eval for PEG 12/18 modify to [employer] TF formula  12/22 MBS: diet advanced to full liquid diet (thins) 12/24 modify to cyclic feedings    Patient diet advanced yesterday s/p MBS. Discussed with MD and RN who plans to report out on intake level. Will modify to cyclic feedings starting tomorrow. Will continue to assess intake and ability to wean nutrition support. Notably, she will need ONS to meet estimated calorie and protein needs, as full liquid diet, alone, cannot sufficiently meet protein needs.   Admit Weight: 44.4 kg Current Weight: 45.1 kg   Weight stable with slight trend up this admission. No significant edema on exam.  Bowels moving/stable. Documented with one BM per day. Lab trends have also improved with regular bowel movements and stable renal function.  Drains/Lines: Cortrak (gastric): placed 12/12 UOP: x 24 hours   Meds: levothyroxine , pantoprazole    BUN/Crt stable. Monitoring kidney function. Electrolytes have stabilized. Has not required recent potassium, magnesium , or phosphorus repletion.  Labs:  Na+ 135 (wdl) K+ 4.7 (wdl) PHOS 4.4 (12/22) Mg 2.2 (12/22) BUN 51 Crt 1.48 CBGs 108-121 x24 hours A1c 4.9 (09/2024)    Diet Order:   Diet Order             Diet full liquid Fluid consistency: Thin  Diet effective now           Diet - low sodium heart healthy                   EDUCATION NEEDS:  Not appropriate for education at this time  Skin:  Skin Assessment: Skin Integrity Issues: Skin Integrity Issues:: Incisions Incisions: Surgical Abdomen  Last BM:  12/23- type 7 x1  Height:  Ht Readings from Last 1 Encounters:  10/25/24 5' 3 (1.6 m)   Weight:  Wt Readings from Last 1 Encounters:  11/12/24 45.1 kg    Ideal Body Weight:  52.27 kg  BMI:  Body mass index is 17.61 kg/m.  Estimated Nutritional Needs:   Kcal:  1500-1700  Protein:  75-90 grams  Fluid:  >/= 1.5L  Blair Deaner MS, RD, LDN Registered Dietitian Clinical Nutrition RD Inpatient Contact Info in Amion

## 2024-11-12 NOTE — Progress Notes (Signed)
 Physical Therapy Treatment Patient Details Name: Erin [name] MRN: [id number] DOB: August 08, 1956 Today's Date: 11/12/2024   History of Present Illness 68 y.o. F  adm to [facility] 12/3 for ventral hernia repair. 12/4 ex lap with abdominal hemorrhage and remained intubated post op. 12/5 abdominal closure. 12/8 MRI with multiple embolic infarcts. 12/9 transfer to [facility]. 12/11 extubated. PMHx: anal CA, hypothyroidism, GERD, CAD, cardiomyopathy, PVD, HLD, HTN, RLS.    PT Comments  Pt presents with increased lethargy and decreased activity tolerance this session. Pt requires maxA for bed mobility and trunk control upon sitting EOB. Pt with significant anterior lean throughout session, able to temporarily correct with verbal cues but does not maintain >10s. UE AAROM plus resistance performed with R UE, pt continues to demonstrate good strength. No muscle action noted in L UE, which is a significant change from prior sessions, RN and MD notified. Pt requesting to return to supine after ~10 minutes activity, stating I don't feel well. Pt would benefit from continued PT services focused on bed mobility, strength, and progressing transfers as appropriate to promote functional mobility.    If plan is discharge home, recommend the following: A lot of help with walking and/or transfers;A lot of help with bathing/dressing/bathroom;Assistance with cooking/housework;Assistance with feeding;Direct supervision/assist for medications management;Direct supervision/assist for financial management;Assist for transportation;Help with stairs or ramp for entrance;Supervision due to cognitive status   Can travel by private vehicle     No  Equipment Recommendations  Rolling walker (2 wheels);BSC/3in1;Wheelchair (measurements PT);Wheelchair cushion (measurements PT);Hoyer lift    Recommendations for Other Services       Precautions / Restrictions Precautions Precautions: Fall Recall of Precautions/Restrictions:  Impaired Restrictions Weight Bearing Restrictions Per Provider Order: No     Mobility  Bed Mobility Overal bed mobility: Needs Assistance Bed Mobility: Supine to Sit, Sit to Supine     Supine to sit: Max assist Sit to supine: Max assist   General bed mobility comments: Pt not following cues to advance B LE over EOB. Pt does pull trunk upright via hand held assist using R UE. Pt tolerates sitting EOB ~10 minutes.    Transfers                        Ambulation/Gait                   Stairs             Wheelchair Mobility     Tilt Bed    Modified Rankin (Stroke Patients Only) Modified Rankin (Stroke Patients Only) Pre-Morbid Rankin Score: No symptoms Modified Rankin: Severe disability     Balance Overall balance assessment: Needs assistance Sitting-balance support: No upper extremity supported Sitting balance-Leahy Scale: Poor Sitting balance - Comments: Pt initially slumped forward while sitting EOB, follows cues to sit upright and correct posture. Pt is able to maintain upright position ~10s. Can see that trunk control is being attempted using R UE on bed rail but is unable to fully correct position herself. MaxA required for trunk support this session.  UE AAROM attempted while sitting EOB, pt able to push and pull with R UE, no muscle activation noted in L UE this session. Postural control: Other (comment) (Signifiant anterior trunk lean this session)                                  Communication Communication Communication: Impaired  Factors Affecting Communication: Difficulty expressing self;Reduced clarity of speech  Cognition Arousal: Lethargic Behavior During Therapy: WFL for tasks assessed/performed   PT - Cognitive impairments: Difficult to assess, Awareness, Problem solving, Initiation, Sequencing, Attention Difficult to assess due to: Impaired communication, Level of arousal                     PT -  Cognition Comments: Pt less responsive to cues, difficulty waking during initial attempt this AM. Following commands: Impaired Following commands impaired: Follows one step commands inconsistently    Cueing Cueing Techniques: Verbal cues, Tactile cues, Visual cues  Exercises      General Comments General comments (skin integrity, edema, etc.): VSS throughout. No new skin abnormalities noted.      Pertinent Vitals/Pain Pain Assessment Pain Assessment: No/denies pain    Home Living                          Prior Function            PT Goals (current goals can now be found in the care plan section) Acute Rehab PT Goals Patient Stated Goal: No new goals stated during session. Progress towards PT goals: Progressing toward goals    Frequency    Min 2X/week      PT Plan      Co-evaluation              AM-PAC PT 6 Clicks Mobility   Outcome Measure  Help needed turning from your back to your side while in a flat bed without using bedrails?: Total Help needed moving from lying on your back to sitting on the side of a flat bed without using bedrails?: Total Help needed moving to and from a bed to a chair (including a wheelchair)?: Total Help needed standing up from a chair using your arms (e.g., wheelchair or bedside chair)?: Total Help needed to walk in hospital room?: Total Help needed climbing 3-5 steps with a railing? : Total 6 Click Score: 6    End of Session   Activity Tolerance: Patient limited by fatigue;Patient limited by lethargy Patient left: in bed;with call bell/phone within reach;with bed alarm set;with family/visitor present Nurse Communication: Mobility status;Other (comment) (Worsened L UE weakness) PT Visit Diagnosis: Other abnormalities of gait and mobility (R26.89);Muscle weakness (generalized) (M62.81) Hemiplegia - Right/Left: Left Hemiplegia - dominant/non-dominant: Non-dominant Hemiplegia - caused by: Cerebral infarction      Time: 8881-8854 PT Time Calculation (min) (ACUTE ONLY): 27 min  Charges:    $Therapeutic Activity: 23-37 mins PT General Charges $$ ACUTE PT VISIT: 1 Visit                     Sabra Morel, PT, DPT  Acute Rehabilitation Services         Office: [phone]      Sabra MARLA Morel 11/12/2024, 12:23 PM

## 2024-11-12 NOTE — Plan of Care (Signed)

## 2024-11-12 NOTE — TOC Progression Note (Signed)
 Transition of Care [facility]) - Progression Note    Patient Details  Name: Erin [name] MRN: [id number] Date of Birth: 19-Aug-1956  [facility] CM/SW Contact  Landry DELENA Senters, RN Phone Number: 11/12/2024, 2:52 PM  Clinical Narrative:     Cm spoke with patient's husband today regarding taking her home. Patient's husband reports he is fixing a leak in his home and isn't able to talk right now. He does report he is interested in taking her home rather than finding placement for patient. CM will plan to f/u with husband on Friday.   CM will continue to follow.   Expected Discharge Plan: Home w Home Health Services Barriers to Discharge: Continued Medical Work up               Expected Discharge Plan and Services   Discharge Planning Services: CM Consult Post Acute Care Choice: Long Term Acute Care (LTAC) Living arrangements for the past 2 months: Single Family Home Expected Discharge Date: 10/23/24               DME Arranged: N/A DME Agency: NA       HH Arranged: NA HH Agency: NA         Social Drivers of Health (SDOH) Interventions SDOH Screenings   Food Insecurity: No Food Insecurity (10/24/2024)  Housing: Low Risk (10/24/2024)  Transportation Needs: No Transportation Needs (10/24/2024)  Utilities: Not At Risk (10/24/2024)  Alcohol Screen: Low Risk (02/26/2024)  Depression (PHQ2-9): Low Risk (02/26/2024)  Financial Resource Strain: Low Risk (02/26/2024)  Physical Activity: Inactive (02/26/2024)  Social Connections: Socially Integrated (10/24/2024)  Stress: No Stress Concern Present (02/26/2024)  Tobacco Use: Medium Risk (10/23/2024)  Health Literacy: Adequate Health Literacy (02/26/2024)    Readmission Risk Interventions     No data to display

## 2024-11-12 NOTE — Progress Notes (Signed)
 " PROGRESS NOTE    Erin [name]  FMW:[id number] DOB: 1956-04-10 DOA: 10/23/2024 PCP: [name] [name], MD   Brief Narrative:  68 year old woman who presented to [facility] 12/3 for elective hernia repair. 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  Patient admitted initially for postop hernia repair found to have hemoperitoneum with hemorrhagic shock postoperatively requiring intubation and pressors.  Her course was subsequently complicated by bilateral strokes thought to be provoked by hypotension.  At this time patient is stabilizing, she has been extubated, continues tube feeds via NG and is otherwise stable from a surgical standpoint.  Transition out of the ICU to hospitalist team on 12/15 with plan for ongoing care with ultimate plan for disposition pending patient's clinical course.  Hospital course 12/3 admitted to ICU postop 12/3-post hernia repair 12/4 taken back to the OR for hemoperitoneum-active bleed was not identified, abdomen packed with wound VAC in place 12/5 back to the OR for an open abdomen-no evidence of ongoing bleeding and abdomen was closed 12/8 - CT Head with interval development (since 04/2024) of multiple cortical and subcortical hypodensities in bilateral cerebellar and cerebral hemispheres suggestive of acute/subacute infarcts. MRI Brain with numerous areas of restricted diffusion throughout the cerebral hemispheres/cerebellum c/f acute infarcts of embolic etiology, associated mild edema (R occipital lobe, L temporal lobe).  12/11 tolerating PSV 5/5 >> extubated 12/15 transition to TRH team - goals of care discussion with husband(see below) 12/17 General surgery evaluating for PEG -pending SLP re-evaluation in hopes she will be able to  improve -LTAC was denied by insurance  Assessment & Plan:    Hemorrhagic shock from hypovolemia caused during ex lap hernia repair SHOCK - required stay in ICU, pressors, received massive blood transfusion protocol with 5 units of packed RBC and 2 units of cryoprecipitate around 10/23/2024, unfortunately incurred anoxic brain injury/CVA, brief support with intubation.  Shock has resolved, monitor with supportive care.  Acute bilateral cerebral and cerebellar infarcts, causing encephalopathy, aphasia, dysphagia - CT head with multiple cortical and subcortical hypodensities, confirmed by MRI, likely due to profound hypotension from #1 above, seen by neuro, patient remains minimally verbal, has dysphagia requiring NG tube feeds for 3 weeks.  She was being prepped for PEG tube placement however showed considerable improvement in her swallowing on 11/11/2024, seen by speech and started on oral diet, if she continues to have stable oral intake we might discontinue NG tube in a day or 2.  Speech therapy following patient showing signs of improvement now.    HTN  - Continue Amlodipine , Carvedilol , added hydralazine  for better blood pressure control, continue as needed IV hydralazine .    S/p hernia repair, elective 12/3, S/p ex-lap 12/4, 12/5  - Postoperative management per CCS,  Wound care per surgery   Acute kidney injury to ATN from shock.  History of left renal artery stenosis.  Renal function improving.  Monitor   Acute respiratory insufficiency , extubated 12/11 - happened due to anoxic brain injury/CVA.  Resolved.  HCAP - Enterobacter 12/6  - Completed antibiotic treatment.  History of peripheral neuropathy and itching.  Usually takes high doses of Neurontin  at home, cut down as she has some encephalopathy on 11/09/2024, reduce sedative medications  Mild metabolic encephalopathy.  See above.  Reduce sedating medications including Neurontin .   DVT prophylaxis:  heparin  injection 5,000 Units  Start: 10/28/24 2200 SCD's Start: 10/23/24 1918 Code Status:   Code Status: Full Code Family Communication: Husband at bedside on 11/07/2024, 11/08/2024, 11/09/2024, 11/10/2024, 11/11/2024, 11/12/2024  Status is: Inpt  Dispo: Home with home health per patient's husband  Consultants:  PCCM, General surgery, Neuro  Procedures:  Hernia repair 12/3 and ex-lap 12/5   Antimicrobials:  None(completed prior course)   Subjective: Patient in bed much more awake alert, denies any headache chest or abdominal pain.  Following commands moving all 4 extremities.  Coughing on command.    Objective: Vitals:   11/11/24 2354 11/12/24 0411 11/12/24 0500 11/12/24 0800  BP: 139/61 (!) 151/68  (!) 146/72  Pulse:  92  (!) 104  Resp: 16 15  16   Temp: 97.7 F (36.5 C) 97.7 F (36.5 C)  98.5 F (36.9 C)  TempSrc: Axillary Axillary  Axillary  SpO2:  94%  94%  Weight:   45.1 kg   Height:        Intake/Output Summary (Last 24 hours) at 11/12/2024 0826 Last data filed at 11/12/2024 0522 Gross per 24 hour  Intake 840 ml  Output 2500 ml  Net -1660 ml   Filed Weights   11/10/24 0500 11/11/24 0346 11/12/24 0500  Weight: 44.3 kg 44.5 kg 45.1 kg    Examination:  Awake but remains nonverbal, does open eyes to loud commands from time to time but inconsistent, withdraws to pain, NG tube in place, .AT,PERRAL Supple Neck, No JVD,   Symmetrical Chest wall movement, Good air movement bilaterally, CTAB RRR,No Gallops, Rubs or new Murmurs,  +ve B.Sounds, Abd Soft, No tenderness, midline abdominal postop scar stable under bandage No Cyanosis, Clubbing or edema      Data Review:   Patient Lines/Drains/Airways Status     Active Line/Drains/Airways     Name Placement date Placement time Site Days   Peripheral IV 10/23/24 20 G 1.75 Anterior;Left Forearm 10/23/24  2037  Forearm  20   External Urinary Catheter 11/01/24  1900  --  11   Small Bore Feeding Tube Left nare Marking at nare/corner of  mouth 68 cm 11/01/24  1536  Left nare  11   Wound 10/23/24 0923 Surgical Closed Surgical Incision Abdomen 10/23/24  0923  Abdomen  20   Wound 10/23/24 9076 Surgical Laparoscopic Abdomen 1: Upper;Left 2: Left;Lower 10/23/24  0923  Abdomen  20   Wound 11/01/24 1600 Irritant Contact Dermatitis Buttocks 11/01/24  1600  Buttocks  11             Inpatient Medications  Scheduled Meds:  amLODipine   10 mg Per Tube Daily   aspirin   81 mg Per Tube Daily   carvedilol   12.5 mg Per Tube BID WC   Chlorhexidine  Gluconate Cloth  6 each Topical Daily   feeding supplement (PROSource TF20)  60 mL Per Tube Daily   free water   200 mL Per Tube Q6H   gabapentin   100 mg Per Tube Q12H   Gerhardt's butt cream   Topical BID   heparin  injection (subcutaneous)  5,000 Units Subcutaneous Q8H   hydrALAZINE   50 mg Per Tube Q8H   Influenza vac split trivalent PF  0.5 mL Intramuscular Tomorrow-1000   levothyroxine   75 mcg Per Tube Q0600   methylphenidate   5 mg Oral q morning   mouth rinse  15 mL Mouth Rinse 4 times per day   pantoprazole  (PROTONIX ) IV  40 mg Intravenous QHS   Continuous Infusions:  feeding  supplement (KATE FARMS STANDARD 1.4) Liquid 1,000 mL (11/11/24 1610)   PRN Meds:.acetaminophen , hydrALAZINE , labetalol , ondansetron  **OR** ondansetron  (ZOFRAN ) IV, mouth rinse, traMADol   DVT Prophylaxis  heparin  injection 5,000 Units Start: 10/28/24 2200 SCD's Start: 10/23/24 1918   Recent Labs  Lab 11/08/24 0437 11/09/24 0453 11/10/24 0408 11/11/24 0334 11/12/24 0327  WBC 10.7* 7.4 6.7 6.7 6.8  HGB 8.6* 8.0* 8.4* 8.4* 8.5*  HCT 26.0* 24.7* 26.1* 26.2* 25.9*  PLT 631* 577* 558* 536* 510*  MCV 95.2 95.7 96.3 96.0 96.3  MCH 31.5 31.0 31.0 30.8 31.6  MCHC 33.1 32.4 32.2 32.1 32.8  RDW 16.1* 16.6* 16.9* 16.4* 16.0*  LYMPHSABS 1.0 0.9 1.0 0.9 1.1  MONOABS 0.9 0.8 0.9 0.9 1.0  EOSABS 0.1 0.1 0.1 0.1 0.1  BASOSABS 0.0 0.0 0.0 0.0 0.0    Recent Labs  Lab 11/07/24 0719 11/08/24 0437  11/09/24 0453 11/10/24 0408 11/11/24 0334 11/12/24 0327  NA 138 136 137 136 136 135  K 3.9 4.0 4.4 4.9 4.5 4.7  CL 108 106 106 104 102 102  CO2 20* 18* 21* 21* 22 23  ANIONGAP 10 12 10 11 13 10   GLUCOSE 112* 105* 111* 105* 104* 103*  BUN 36* 38* 40* 47* 50* 51*  CREATININE 1.32* 1.28* 1.43* 1.60* 1.56* 1.48*  AST 19 19 16 17 18 16   ALT 12 11 8 10 12 10   ALKPHOS 89 87 84 83 83 88  BILITOT <0.2 <0.2 <0.2 <0.2 0.2 0.3  ALBUMIN  3.2* 3.1* 3.2* 3.2* 3.4* 3.5  MG 1.7 1.9 1.9 2.1 2.2  --   PHOS 4.4 4.2 5.0* 5.0* 4.4  --   CALCIUM  8.8* 8.8* 8.7* 9.0 9.2 9.2      Recent Labs  Lab 11/07/24 0719 11/08/24 0437 11/09/24 0453 11/10/24 0408 11/11/24 0334 11/12/24 0327  MG 1.7 1.9 1.9 2.1 2.2  --   CALCIUM  8.8* 8.8* 8.7* 9.0 9.2 9.2    --------------------------------------------------------------------------------------------------------------- Lab Results  Component Value Date   CHOL 91 10/29/2024   HDL 12 (L) 10/29/2024   LDLCALC 13 10/29/2024   LDLDIRECT 70.0 04/07/2021   TRIG 331 (H) 10/29/2024   CHOLHDL 7.6 10/29/2024    Lab Results  Component Value Date   HGBA1C 4.9 10/18/2024   No results for input(s): TSH, T4TOTAL, FREET4, T3FREE, THYROIDAB in the last 72 hours. No results for input(s): VITAMINB12, FOLATE, FERRITIN, TIBC, IRON, RETICCTPCT in the last 72 hours. ------------------------------------------------------------------------------------------------------------------ Cardiac Enzymes No results for input(s): CKMB, TROPONINI, MYOGLOBIN in the last 168 hours.  Invalid input(s): CK  Micro Results Recent Results (from the past 240 hours)  C Difficile Quick Screen (NO PCR Reflex)     Status: None   Collection Time: 11/02/24 12:23 PM   Specimen: STOOL  Result Value Ref Range Status   C Diff antigen NEGATIVE NEGATIVE Final   C Diff toxin NEGATIVE NEGATIVE Final   C Diff interpretation No C. difficile detected.  Final    Comment:  Performed at [facility], [address], [place], [place] [address]    Radiology Reports  DG Swallowing [facility] Pathology Result Date: 11/11/2024 Table formatting from the original result was not included. Modified Barium Swallow Study Patient Details Name: YARESLY MENZEL MRN: [id number] Date of Birth: 1955-12-23 Today's Date: 11/11/2024 HPI/PMH: HPI: 68 yo F adm to [facility] 12/3 for ventral hernia repair. 12/4 ex lap with abd hemorrhage, remained intubated post op. 12/5 abdominal closure. 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 12/9 transfer to [facility]. 12/11 extubated.  BSE 11/01/24: NPO, ice chips after oral care. MBS 12/17: silent aspiration, DNFC; NPO. Significant issues with attn/cog. SLP following for cognitive-linguistic deficits and dysphagia. PMHx: anal CA, hypothyroidism, GERD, CAD, cardiomyopathy, PVD, HLD, HTN, RLS. Per 2023 MBS, mild pharyngeal dysphagia and suspected primary esophageal dysphagia. MBSS completed on 11/11/24 showed mild oropharygneal dysphagia with recommendations of a full liquid/thin liquid diet. Clinical Impression:  Pt presents with a mild oropharyngeal dysphagia per results of MBSS completed today. No aspiration observed; however, there was deep penetration of thin liquid residue to the level of the vocal cords, which increases pt's risk of aspiration. Recommend initiation of a full liquids, thin liquids diet. SLP will follow to assess diet tolerance and clinically advance diet as indicated. Consider a repeat MBSS once Cortrak is removed. Oral deficits characterized by increased oral prep and transit time with soft solid. Pt able to clear bolus with additional time. Pharyngeal deficits characterized by reduced hyolaryngeal elevation/excursion, reduced base of tongue retraction, incomplete epiglottic inversion, reduced laryngeal vestibule closure, and reduced pharyngeal stripping. Findings -No aspiration observed across trials. -There was transient and stagnant penetration  of thin liquids and nectar-thick liquids during and after the swallow. Pt eventually cleared penetrated residue with cued swallow or cued throat clear (followed direction x1 occurrence). -There was min-moderate vallecular and pyriform residue following the majority of residue. Amount of residue appeared to increased as the viscosity increased. *some frames were not recorded due to equipment issue. Detailed diet recommendations and aspiration precautions outlined below. Continue SLP PoC. Factors that may increase risk of adverse event in presence of aspiration Noe & Lianne 2021): Factors that may increase risk of adverse event in presence of aspiration Noe & Lianne 2021): Reduced cognitive function; Frail or deconditioned; Dependence for feeding and/or oral hygiene Recommendations/Plan: Swallowing Evaluation Recommendations Swallowing Evaluation Recommendations Recommendations: PO diet PO Diet Recommendation: Full liquid diet; Thin liquids (Level 0) Liquid Administration via: Cup; No straw Medication Administration: Crushed with puree Supervision: Staff to assist with self-feeding; Full supervision/cueing for swallowing strategies Swallowing strategies  : Minimize environmental distractions; Slow rate; Small bites/sips; Multiple dry swallows after each bite/sip Postural changes: Position pt fully upright for meals; Stay upright 30-60 min after meals Oral care recommendations: Oral care BID (2x/day) Treatment Plan Treatment Plan Treatment recommendations: Therapy as outlined in treatment plan below Follow-up recommendations: Follow physicians's recommendations for discharge plan and follow up therapies Functional status assessment: Patient has had a recent decline in their functional status and demonstrates the ability to make significant improvements in function in a reasonable and predictable amount of time. Treatment frequency: Min 2x/week Treatment duration: 3 weeks Interventions: Aspiration precaution  training; Oropharyngeal exercises; Compensatory techniques; Patient/family education; Trials of upgraded texture/liquids; Diet toleration management by SLP Recommendations Recommendations for follow up therapy are one component of a multi-disciplinary discharge planning process, led by the attending physician.  Recommendations may be updated based on patient status, additional functional criteria and insurance authorization. Assessment: Orofacial Exam: Orofacial Exam Oral Cavity: Oral Hygiene: WFL Oral Cavity - Dentition: Missing dentition Orofacial Anatomy: WFL Oral Motor/Sensory Function: -- (unable to assess d/t cognitive deficits) Anatomy: Anatomy: WFL Boluses Administered: Boluses Administered Boluses Administered: Thin liquids (Level 0); Mildly thick liquids (Level 2, nectar thick); Puree; Solid  Oral Impairment Domain: Oral Impairment Domain Lip Closure: No labial escape Tongue control during bolus hold: Not tested Bolus preparation/mastication: Slow prolonged chewing/mashing with complete recollection Bolus transport/lingual motion: Brisk tongue motion Oral residue: Trace residue lining oral structures Initiation of pharyngeal swallow : Valleculae  Pharyngeal Impairment Domain: Pharyngeal Impairment Domain Soft palate elevation: No bolus between soft palate (SP)/pharyngeal wall (PW) Laryngeal elevation: Partial superior movement of thyroid  cartilage/partial approximation of arytenoids to epiglottic petiole Anterior hyoid excursion: Partial anterior movement Epiglottic movement: Partial inversion Laryngeal vestibule closure: Incomplete, narrow column air/contrast in laryngeal vestibule Pharyngeal stripping wave : Present - diminished Pharyngeal contraction (A/P view only): N/A Pharyngoesophageal segment opening: Complete distension and complete duration, no obstruction of flow Tongue base retraction: Narrow column of contrast or air between tongue base and PPW Pharyngeal residue: Collection of residue within  or on pharyngeal structures Location of pharyngeal residue: Valleculae; Pyriform sinuses  Esophageal Impairment Domain: Esophageal Impairment Domain Esophageal clearance upright position: -- (not assessed) Pill: Pill Consistency administered: -- (not assessed) Penetration/Aspiration Scale Score: Penetration/Aspiration Scale Score 1.  Material does not enter airway: Puree; Solid 2.  Material enters airway, remains ABOVE vocal cords then ejected out: Thin liquids (Level 0); Mildly thick liquids (Level 2, nectar thick) 3.  Material enters airway, remains ABOVE vocal cords and not ejected out: Mildly thick liquids (Level 2, nectar thick) 5.  Material enters airway, CONTACTS cords and not ejected out: Thin liquids (Level 0) Compensatory Strategies: Compensatory Strategies Compensatory strategies: Yes Other(comment): Effective Effective Other(comment): Puree; Solid; Thin liquid (Level 0) (downward pressure on tongue with spoon to cue dry swallow)   General Information: Caregiver present: No  Diet Prior to this Study: NPO   Temperature : Normal   Respiratory Status: WFL   Supplemental O2: None (Room air)   History of Recent Intubation: Yes  Behavior/Cognition: Alert; Cooperative Self-Feeding Abilities: Needs assist with self-feeding Baseline vocal quality/speech: Normal Volitional Cough: Unable to elicit Volitional Swallow: Unable to elicit Exam Limitations: No limitations Goal Planning: Prognosis for improved oropharyngeal function: Good Barriers to Reach Goals: Cognitive deficits; Language deficits No data recorded Patient/Family Stated Goal: discharge home Consulted and agree with results and recommendations: Patient; Family member/caregiver; Nurse; Physician Pain: Pain Assessment Pain Assessment: No/denies pain Pain Score: 0 Faces Pain Scale: 0 Breathing: 0 Negative Vocalization: 0 Facial Expression: 0 Body Language: 0 Consolability: 0 PAINAD Score: 0 End of Session: Start Time:SLP Start Time (ACUTE ONLY): 1330 Stop  Time: SLP Stop Time (ACUTE ONLY): 1349 Time Calculation:SLP Time Calculation (min) (ACUTE ONLY): 19 min Charges: SLP Evaluations $ SLP Speech Visit: 1 Visit SLP Evaluations $MBS Swallow: 1 Procedure $Swallowing Treatment: 1 Procedure SLP visit diagnosis: SLP Visit Diagnosis: Dysphagia, pharyngeal phase (R13.13); Cognitive communication deficit (R41.841) Past Medical History: Past Medical History: Diagnosis Date  Allergy   Alopecia   Anal cancer (HCC) 08/14/2013  invasive squamous cell ca, s/p radiation 10/20-11/26/14 60.4Gy/44fx and chemo  Anxiety   Aortoiliac occlusive disease (HCC) 08/14/2017  Arthritis   B12 deficiency anemia 09/14/2015  Blood transfusion without reported diagnosis   BPPV (benign paroxysmal positional vertigo) 07/08/2015  Cardiomyopathy (HCC) 11/03/2017  Chronic back pain   Chronic daily headache 03/29/2013  takes bc powder  Closed right hip fracture (HCC) 09/10/2015  Coronary artery disease involving native coronary artery of native heart without angina pectoris 10/13/2017  DES to mid RCA  Depression   Essential (hemorrhagic) thrombocythemia (HCC) 09/06/2018  Essential hypertension 09/25/2023  Family history of early CAD 04/08/2016  GERD (gastroesophageal reflux disease)   History of hiatal hernia   Hot flashes   Hyperlipidemia   Hyperlipidemia associated with type 2 diabetes mellitus (HCC) 05/11/2017  Hypothyroidism 09/10/2015  IBS (irritable bowel syndrome) 03/29/2013  Neck pain 07/08/2015  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  takes neurotin  Peripheral vascular disease  11/03/2017  QT prolongation   Sacroiliitis 09/06/2018  Spasms of the hands or feet 10/08/2017  Syncope 06/07/2016  Tubular adenoma of colon 09/08/2003  Vertigo   Wears glasses  Past Surgical History: Past Surgical History: Procedure Laterality Date  ABDOMINAL AORTOGRAM N/A 08/02/2017  Procedure: ABDOMINAL AORTOGRAM;  Surgeon: Darron Deatrice LABOR, MD;  Location: [facility];  Service: Cardiovascular;  Laterality: N/A;  ABDOMINAL  AORTOGRAM W/LOWER EXTREMITY Bilateral 09/03/2024  Procedure: ABDOMINAL AORTOGRAM W/LOWER EXTREMITY;  Surgeon: Serene Gaile ORN, MD;  Location: [facility];  Service: Cardiovascular;  Laterality: Bilateral;  AORTA - BILATERAL FEMORAL ARTERY BYPASS GRAFT N/A 08/14/2017  Procedure: AORTA BIFEMORAL BYPASS GRAFT;  Surgeon: Oris Krystal FALCON, MD;  Location: [facility] OR;  Service: Vascular;  Laterality: N/A;  COLONOSCOPY    COLONOSCOPY N/A 08/29/2024  Procedure: COLONOSCOPY;  Surgeon: Wilhelmenia Aloha Raddle., MD;  Location: [facility];  Service: Gastroenterology;  Laterality: N/A;  CORONARY STENT INTERVENTION N/A 10/13/2017  Procedure: CORONARY STENT INTERVENTION;  Surgeon: Mady Bruckner, MD;  Location: [facility];  Service: Cardiovascular;  Laterality: N/A;  dental implant    ECTOPIC PREGNANCY SURGERY    EMBOLECTOMY N/A 08/14/2017  Procedure: EMBOLECTOMY FEMORAL;  Surgeon: Oris Krystal FALCON, MD;  Location: [facility] OR;  Service: Vascular;  Laterality: N/A;  FEMORAL-POPLITEAL BYPASS GRAFT Right 08/14/2017  Procedure: Right Femoral to Above Knee Popliteal Bypass Graft using Non-Reversed Greater Saphenous Vein Graft from Right Leg;  Surgeon: Oris Krystal FALCON, MD;  Location: [facility] OR;  Service: Vascular;  Laterality: Right;  FLEXIBLE SIGMOIDOSCOPY N/A 08/14/2013  Procedure: FLEXIBLE SIGMOIDOSCOPY;  Surgeon: Gwendlyn ONEIDA Buddy, MD;  Location: [facility];  Service: Endoscopy;  Laterality: N/A;  LAPAROTOMY N/A 10/24/2024  Procedure: EXPLORATORY LAPAROTOMY, ABDOMINAL PACKING AND WOUND VAC APPLICATION;  Surgeon: Debby Hila, MD;  Location: [facility] ORS;  Service: General;  Laterality: N/A;  (8) EIGHT 18X18 LAP SPONGE PACKED  LAPAROTOMY N/A 10/25/2024  Procedure: LAPAROTOMY, EXPLORATORY;  Surgeon: Rubin Calamity, MD;  Location: [facility];  Service: General;  Laterality: N/A;  REMOVAL OF PACKING  LEFT HEART CATH AND CORONARY ANGIOGRAPHY N/A 10/13/2017  Procedure: LEFT HEART CATH AND CORONARY ANGIOGRAPHY;  Surgeon: Mady Bruckner, MD;  Location: [facility];  Service: Cardiovascular;  Laterality: N/A;  LOWER EXTREMITY ANGIOGRAPHY Bilateral 08/02/2017  Procedure: Lower Extremity Angiography;  Surgeon: Darron Deatrice LABOR, MD;  Location: [facility] INVASIVE CV LAB;  Service: Cardiovascular;  Laterality: Bilateral;  LOWER EXTREMITY INTERVENTION N/A 09/03/2024  Procedure: LOWER EXTREMITY INTERVENTION;  Surgeon: Serene Gaile ORN, MD;  Location: [facility];  Service: Cardiovascular;  Laterality: N/A;  MULTIPLE TOOTH EXTRACTIONS    PERIPHERAL VASCULAR INTERVENTION  05/22/2018  Procedure: PERIPHERAL VASCULAR INTERVENTION;  Surgeon: Serene Gaile ORN, MD;  Location: [facility];  Service: Cardiovascular;;  SMA and Celiac  PILONIDAL CYST EXCISION    POLYPECTOMY    THROMBECTOMY FEMORAL ARTERY Right 08/14/2017  Procedure: THROMBECTOMY FEMORAL ARTERY;  Surgeon: Oris Krystal FALCON, MD;  Location: [facility] OR;  Service: Vascular;  Laterality: Right;  TONSILLECTOMY    TOTAL HIP ARTHROPLASTY  09/11/2015  Procedure: TOTAL HIP ARTHROPLASTY;  Surgeon: Evalene JONETTA Chancy, MD;  Location: [facility] OR;  Service: Orthopedics;;  ULTRASOUND GUIDANCE FOR VASCULAR ACCESS  10/13/2017  Procedure: Ultrasound Guidance For Vascular Access;  Surgeon: Mady Bruckner, MD;  Location: [facility];  Service: Cardiovascular;;  VENTRAL HERNIA REPAIR N/A 10/23/2024  Procedure: REPAIR, HERNIA, VENTRAL, LAPAROSCOPIC;  Surgeon: Rubin Calamity, MD;  Location: [facility] OR[facility];  Service: General;  Laterality:  N/A;  VISCERAL ANGIOGRAPHY N/A 03/03/2020  Procedure: MESTENRIC ANGIOGRAPHY;  Surgeon: Serene Gaile ORN, MD;  Location: [facility];  Service: Cardiovascular;  Laterality: N/A;  VISCERAL ANGIOGRAPHY N/A 09/03/2024  Procedure: VISCERAL ANGIOGRAPHY;  Surgeon: Serene Gaile ORN, MD;  Location: [facility];  Service: Cardiovascular;  Laterality: N/A;  VISCERAL ARTERY INTERVENTION N/A 09/03/2024  Procedure: VISCERAL ARTERY INTERVENTION;  Surgeon: Serene Gaile ORN, MD;  Location: [facility];   Service: Cardiovascular;  Laterality: N/A; [name] 11/11/2024, 3:17 PM      Signature  -   Lavada Stank M.D on 11/12/2024 at 8:26 AM   -  To page go to [email or web address]     "

## 2024-11-13 ENCOUNTER — Inpatient Hospital Stay (HOSPITAL_COMMUNITY)

## 2024-11-13 DIAGNOSIS — Z8719 Personal history of other diseases of the digestive system: Secondary | ICD-10-CM | POA: Diagnosis not present

## 2024-11-13 DIAGNOSIS — Z9889 Other specified postprocedural states: Secondary | ICD-10-CM | POA: Diagnosis not present

## 2024-11-13 LAB — COMPREHENSIVE METABOLIC PANEL WITH GFR
ALT: 13 U/L (ref 0–44)
AST: 22 U/L (ref 15–41)
Albumin: 3.6 g/dL (ref 3.5–5.0)
Alkaline Phosphatase: 91 U/L (ref 38–126)
Anion gap: 11 (ref 5–15)
BUN: 56 mg/dL — ABNORMAL HIGH (ref 8–23)
CO2: 24 mmol/L (ref 22–32)
Calcium: 9.6 mg/dL (ref 8.9–10.3)
Chloride: 101 mmol/L (ref 98–111)
Creatinine, Ser: 1.5 mg/dL — ABNORMAL HIGH (ref 0.44–1.00)
GFR, Estimated: 38 mL/min — ABNORMAL LOW
Glucose, Bld: 108 mg/dL — ABNORMAL HIGH (ref 70–99)
Potassium: 4.6 mmol/L (ref 3.5–5.1)
Sodium: 136 mmol/L (ref 135–145)
Total Bilirubin: 0.2 mg/dL (ref 0.0–1.2)
Total Protein: 6.3 g/dL — ABNORMAL LOW (ref 6.5–8.1)

## 2024-11-13 LAB — CBC WITH DIFFERENTIAL/PLATELET
Abs Immature Granulocytes: 0.06 K/uL (ref 0.00–0.07)
Basophils Absolute: 0 K/uL (ref 0.0–0.1)
Basophils Relative: 1 %
Eosinophils Absolute: 0.1 K/uL (ref 0.0–0.5)
Eosinophils Relative: 1 %
HCT: 28 % — ABNORMAL LOW (ref 36.0–46.0)
Hemoglobin: 9.1 g/dL — ABNORMAL LOW (ref 12.0–15.0)
Immature Granulocytes: 1 %
Lymphocytes Relative: 15 %
Lymphs Abs: 1.2 K/uL (ref 0.7–4.0)
MCH: 31.5 pg (ref 26.0–34.0)
MCHC: 32.5 g/dL (ref 30.0–36.0)
MCV: 96.9 fL (ref 80.0–100.0)
Monocytes Absolute: 1.1 K/uL — ABNORMAL HIGH (ref 0.1–1.0)
Monocytes Relative: 13 %
Neutro Abs: 5.8 K/uL (ref 1.7–7.7)
Neutrophils Relative %: 69 %
Platelets: 469 K/uL — ABNORMAL HIGH (ref 150–400)
RBC: 2.89 MIL/uL — ABNORMAL LOW (ref 3.87–5.11)
RDW: 16 % — ABNORMAL HIGH (ref 11.5–15.5)
WBC: 8.2 K/uL (ref 4.0–10.5)
nRBC: 0 % (ref 0.0–0.2)

## 2024-11-13 LAB — PHOSPHORUS: Phosphorus: 4.5 mg/dL (ref 2.5–4.6)

## 2024-11-13 LAB — GLUCOSE, CAPILLARY
Glucose-Capillary: 115 mg/dL — ABNORMAL HIGH (ref 70–99)
Glucose-Capillary: 116 mg/dL — ABNORMAL HIGH (ref 70–99)
Glucose-Capillary: 117 mg/dL — ABNORMAL HIGH (ref 70–99)
Glucose-Capillary: 122 mg/dL — ABNORMAL HIGH (ref 70–99)
Glucose-Capillary: 83 mg/dL (ref 70–99)
Glucose-Capillary: 94 mg/dL (ref 70–99)

## 2024-11-13 LAB — MAGNESIUM: Magnesium: 2.3 mg/dL (ref 1.7–2.4)

## 2024-11-13 NOTE — Progress Notes (Signed)
 Nutrition Brief Note  Per documentation, intake not promising. Unlikely patient will meet estimated calorie/protein needs by oral intake alone. Discussed with MD at progression this morning. Plan to move forward with evaluation for PEG tube placement. Patient TF off this morning at 10AM with plans to start cyclic feeds to meet 100% estimated needs tonight. Will assess tolerance.  INTERVENTION:  Modify TF via Cortrak to cyclic feeding (6PM-10AM): -[employer] 1.4 Standard at 60 ml/h (960 ml per day) -Continue Pro-Source TF20 60 mL daily    -Free water  flushes 200 ml q6h (800 ml per day) -Provides 1592 kcal, 87 gm protein, 1567 ml free water  daily (TF + FWF)   Add Ensure Plus High Protein po BID, each supplement provides 350 kcal and 20 grams of protein    Add Magic cup TID with meals, each supplement provides 290 kcal and 9 grams of protein    Monitor SLP notes for diet advancement/tolerance and ability to wean nutrition support   Monitor GOC discussion and modify plan of care as indicated   Monitor bowels and need for scheduled bowel regimen     NUTRITION DIAGNOSIS:  Moderate Malnutrition related to chronic illness as evidenced by moderate muscle depletion, percent weight loss (15% in 6 months). - remains applicable   GOAL:  Patient will meet greater than or equal to 90% of their needs - meeting via tube feeds + oral diet   Blair Deaner MS, RD, LDN Registered Dietitian I Clinical Nutrition RD Inpatient Contact Info in Amion

## 2024-11-13 NOTE — Progress Notes (Signed)
 " PROGRESS NOTE    Erin [name]  FMW:[id number] DOB: [birth year] DOA: 10/23/2024 PCP: [name] [name], MD   Brief Narrative:  68 year old woman who presented to [facility] 12/3 for elective hernia repair. 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  Patient admitted initially for postop hernia repair found to have hemoperitoneum with hemorrhagic shock postoperatively requiring intubation and pressors.  Her course was subsequently complicated by bilateral strokes thought to be provoked by hypotension.  At this time patient is stabilizing, she has been extubated, continues tube feeds via NG and is otherwise stable from a surgical standpoint.  Transition out of the ICU to hospitalist team on 12/15 with plan for ongoing care with ultimate plan for disposition pending patient's clinical course.  Hospital course 12/3 admitted to ICU postop 12/3-post hernia repair 12/4 taken back to the OR for hemoperitoneum-active bleed was not identified, abdomen packed with wound VAC in place 12/5 back to the OR for an open abdomen-no evidence of ongoing bleeding and abdomen was closed 12/8 - CT Head with interval development (since 04/2024) of multiple cortical and subcortical hypodensities in bilateral cerebellar and cerebral hemispheres suggestive of acute/subacute infarcts. MRI Brain with numerous areas of restricted diffusion throughout the cerebral hemispheres/cerebellum c/f acute infarcts of embolic etiology, associated mild edema (R occipital lobe, L temporal lobe).  12/11 tolerating PSV 5/5 >> extubated 12/15 transition to TRH team - goals of care discussion with husband(see below) 12/17 General surgery evaluating for PEG -pending SLP re-evaluation in hopes she will be able to  improve -LTAC was denied by insurance  Assessment & Plan:    Hemorrhagic shock from hypovolemia caused during ex lap hernia repair SHOCK - required stay in ICU, pressors, received massive blood transfusion protocol with 5 units of packed RBC and 2 units of cryoprecipitate around 10/23/2024, unfortunately incurred anoxic brain injury/CVA, brief support with intubation.  Shock has resolved, monitor with supportive care.  Acute bilateral cerebral and cerebellar infarcts, causing encephalopathy, aphasia, dysphagia - CT head with multiple cortical and subcortical hypodensities, confirmed by MRI, likely due to profound hypotension from #1 above, seen by neuro, patient remains minimally verbal, has dysphagia requiring NG tube feeds for 3 weeks.  She was being prepped for PEG tube placement however showed considerable improvement in her swallowing on 11/11/2024, seen by speech and started on oral diet, but overall her oral intake remains poor, so even if she improves regarding dysphagia but she will always have an issue of enough calories/nutrition intake, so she will benefit from PEG as I have discussed with husband, so IR has been reengaged to evaluate for PEG.      HTN  - Continue Amlodipine , Carvedilol , added hydralazine  for better blood pressure control, continue as needed IV hydralazine .    S/p hernia repair, elective 12/3, S/p ex-lap 12/4, 12/5  - Postoperative management per CCS,  Wound care per surgery   Acute kidney injury to ATN from shock.  History of left renal artery stenosis.  Renal function improving.  Monitor   Acute respiratory insufficiency , extubated 12/11 - happened due to anoxic brain injury/CVA.  Resolved.  HCAP - Enterobacter 12/6  - Completed antibiotic treatment.  History of peripheral neuropathy and itching.  Usually takes high doses of Neurontin  at home, cut down as she has some encephalopathy on 11/09/2024,  reduce sedative medications  Mild metabolic encephalopathy.  See  above.  Reduce sedating medications including Neurontin .   DVT prophylaxis: heparin  injection 5,000 Units Start: 10/28/24 2200 SCD's Start: 10/23/24 1918 Code Status:   Code Status: Full Code Family Communication: Husband at bedside   Status is: Inpt  Dispo: Home with home health per patient's husband  Consultants:  PCCM, General surgery, Neuro  Procedures:  Hernia repair 12/3 and ex-lap 12/5   Antimicrobials:  None(completed prior course)   Subjective:  Patient is more awake today, but unable to answer any questions     Objective: Vitals:   11/13/24 0800 11/13/24 1108 11/13/24 1200 11/13/24 1331  BP: (!) 172/72 128/72 138/65   Pulse: 88 89 80 80  Resp: 17 13 15 17   Temp:  98.6 F (37 C)    TempSrc:  Oral    SpO2: 95% 96% 95% 96%  Weight:      Height:        Intake/Output Summary (Last 24 hours) at 11/13/2024 1550 Last data filed at 11/13/2024 1106 Gross per 24 hour  Intake 800 ml  Output 350 ml  Net 450 ml   Filed Weights   11/11/24 0346 11/12/24 0500 11/13/24 0500  Weight: 44.5 kg 45.1 kg 43.5 kg    Examination:   Awake, open eyes, but does not follow any commands or answer any questions, frail CTAB RRR +ve B.Sounds. Abdominal postop scar stable under bandage No Cyanosis, Clubbing or edema, No new Rash or bruise       Data Review:   Patient Lines/Drains/Airways Status     Active Line/Drains/Airways     Name Placement date Placement time Site Days   Peripheral IV 10/23/24 20 G 1.75 Anterior;Left Forearm 10/23/24  2037  Forearm  21   External Urinary Catheter 11/01/24  1900  --  12   Small Bore Feeding Tube Left nare Marking at nare/corner of mouth 68 cm 11/01/24  1536  Left nare  12   Wound 10/23/24 0923 Surgical Closed Surgical Incision Abdomen 10/23/24  0923  Abdomen  21   Wound 10/23/24 9076 Surgical Laparoscopic Abdomen 1: Upper;Left 2: Left;Lower 10/23/24  0923  Abdomen  21   Wound 11/01/24 1600 Irritant Contact Dermatitis Buttocks  11/01/24  1600  Buttocks  12             Inpatient Medications  Scheduled Meds:  amLODipine   10 mg Per Tube Daily   aspirin   81 mg Per Tube Daily   carvedilol   12.5 mg Per Tube BID WC   Chlorhexidine  Gluconate Cloth  6 each Topical Daily   feeding supplement  237 mL Oral BID BM   feeding supplement (K[employer] STANDARD 1.4) Liquid  960 mL Per Tube Q24H   feeding supplement (PROSource TF20)  60 mL Per Tube Daily   free water   200 mL Per Tube Q6H   gabapentin   100 mg Per Tube Q12H   Gerhardt's butt cream   Topical BID   heparin  injection (subcutaneous)  5,000 Units Subcutaneous Q8H   hydrALAZINE   50 mg Per Tube Q8H   Influenza vac split trivalent PF  0.5 mL Intramuscular Tomorrow-1000   levothyroxine   75 mcg Per Tube Q0600   methylphenidate   5 mg Oral q morning   mouth rinse  15 mL Mouth Rinse 4 times per day   pantoprazole  (PROTONIX ) IV  40 mg Intravenous QHS   Continuous Infusions:   PRN Meds:.acetaminophen , hydrALAZINE , labetalol , ondansetron  **OR** ondansetron  (ZOFRAN ) IV, mouth  rinse, traMADol   DVT Prophylaxis  heparin  injection 5,000 Units Start: 10/28/24 2200 SCD's Start: 10/23/24 1918   Recent Labs  Lab 11/09/24 0453 11/10/24 0408 11/11/24 0334 11/12/24 0327 11/13/24 0155  WBC 7.4 6.7 6.7 6.8 8.2  HGB 8.0* 8.4* 8.4* 8.5* 9.1*  HCT 24.7* 26.1* 26.2* 25.9* 28.0*  PLT 577* 558* 536* 510* 469*  MCV 95.7 96.3 96.0 96.3 96.9  MCH 31.0 31.0 30.8 31.6 31.5  MCHC 32.4 32.2 32.1 32.8 32.5  RDW 16.6* 16.9* 16.4* 16.0* 16.0*  LYMPHSABS 0.9 1.0 0.9 1.1 1.2  MONOABS 0.8 0.9 0.9 1.0 1.1*  EOSABS 0.1 0.1 0.1 0.1 0.1  BASOSABS 0.0 0.0 0.0 0.0 0.0    Recent Labs  Lab 11/08/24 0437 11/09/24 0453 11/10/24 0408 11/11/24 0334 11/12/24 0327 11/13/24 0155  NA 136 137 136 136 135 136  K 4.0 4.4 4.9 4.5 4.7 4.6  CL 106 106 104 102 102 101  CO2 18* 21* 21* 22 23 24   ANIONGAP 12 10 11 13 10 11   GLUCOSE 105* 111* 105* 104* 103* 108*  BUN 38* 40* 47* 50* 51* 56*   CREATININE 1.28* 1.43* 1.60* 1.56* 1.48* 1.50*  AST 19 16 17 18 16 22   ALT 11 8 10 12 10 13   ALKPHOS 87 84 83 83 88 91  BILITOT <0.2 <0.2 <0.2 0.2 0.3 0.2  ALBUMIN  3.1* 3.2* 3.2* 3.4* 3.5 3.6  MG 1.9 1.9 2.1 2.2  --  2.3  PHOS 4.2 5.0* 5.0* 4.4  --  4.5  CALCIUM  8.8* 8.7* 9.0 9.2 9.2 9.6      Recent Labs  Lab 11/08/24 0437 11/09/24 0453 11/10/24 0408 11/11/24 0334 11/12/24 0327 11/13/24 0155  MG 1.9 1.9 2.1 2.2  --  2.3  CALCIUM  8.8* 8.7* 9.0 9.2 9.2 9.6    --------------------------------------------------------------------------------------------------------------- Lab Results  Component Value Date   CHOL 91 10/29/2024   HDL 12 (L) 10/29/2024   LDLCALC 13 10/29/2024   LDLDIRECT 70.0 04/07/2021   TRIG 331 (H) 10/29/2024   CHOLHDL 7.6 10/29/2024    Lab Results  Component Value Date   HGBA1C 4.9 10/18/2024   No results for input(s): TSH, T4TOTAL, FREET4, T3FREE, THYROIDAB in the last 72 hours. No results for input(s): VITAMINB12, FOLATE, FERRITIN, TIBC, IRON, RETICCTPCT in the last 72 hours. ------------------------------------------------------------------------------------------------------------------ Cardiac Enzymes No results for input(s): CKMB, TROPONINI, MYOGLOBIN in the last 168 hours.  Invalid input(s): CK  Micro Results No results found for this or any previous visit (from the past 240 hours).   Radiology Reports  No results found.      Signature  -   Brayton Lye M.D on 11/13/2024 at 3:50 PM   -  To page go to [email or web address]     "

## 2024-11-13 NOTE — Progress Notes (Signed)
 Physical Therapy Treatment Patient Details Name: Erin [name] MRN: [id number] DOB: [birth year] Today's Date: 11/13/2024   History of Present Illness 68 y.o. F  adm to [facility] 12/3 for ventral hernia repair. 12/4 ex lap with abdominal hemorrhage and remained intubated post op. 12/5 abdominal closure. 12/8 MRI with multiple embolic infarcts. 12/9 transfer to [facility]. 12/11 extubated. PMHx: anal CA, hypothyroidism, GERD, CAD, cardiomyopathy, PVD, HLD, HTN, RLS.    PT Comments  Pt demonstrating decreased participation during PT sessions. Pt heavily encouraged by PT and husband to participate in bed mobility, pt states I don't want to and husband responds I'm going to make you do something. Pt maxA for bed mobility and maxA to maintain upright trunk position while EOB. Husband very involved and provides trunk support during session. PROM performed to pt's B LE while supine and seated EOB. Pt does not follow cues during session and minimal muscle activation noted in B LE and UE while EOB. After session, pt independently lifts B LE into bed and sits up in bed to reposition without assist. Pt has not shown progress in most recent sessions due to limited participation despite heavy encouragement and education. Pt would benefit from continued PT services focused on strength, balance, and transfers to promote functional mobility.   If plan is discharge home, recommend the following: A lot of help with walking and/or transfers;A lot of help with bathing/dressing/bathroom;Assistance with cooking/housework;Assistance with feeding;Direct supervision/assist for medications management;Direct supervision/assist for financial management;Assist for transportation;Help with stairs or ramp for entrance;Supervision due to cognitive status   Can travel by private vehicle     No  Equipment Recommendations  Rolling walker (2 wheels);Wheelchair (measurements PT);Wheelchair cushion (measurements PT);Hospital bed;Hoyer  lift;BSC/3in1    Recommendations for Other Services       Precautions / Restrictions Precautions Precautions: Fall Recall of Precautions/Restrictions: Impaired Restrictions Weight Bearing Restrictions Per Provider Order: No     Mobility  Bed Mobility Overal bed mobility: Needs Assistance Bed Mobility: Supine to Sit, Sit to Supine     Supine to sit: Max assist Sit to supine: Max assist   General bed mobility comments: Pt not contributing to mobility today. Husband providing heavy encouragement. MaxA required for  transfer and static balance while sitting EOB. Pt pulls B LE back into bed independently after maxA to move off EOB. Pt observed sitting up independently in bed in order to reposition self and scratch back (reaching behind back with R hand and holding trunk up with L hand) but does not participate in mobility for purpose of session.    Transfers                   General transfer comment: Pt not safe to transfer at this time due to maxA for trunk control and lethargy throughout session.    Ambulation/Gait                   Stairs             Wheelchair Mobility     Tilt Bed    Modified Rankin (Stroke Patients Only)       Balance Overall balance assessment: Needs assistance Sitting-balance support: No upper extremity supported Sitting balance-Leahy Scale: Zero Sitting balance - Comments: MaxA required for trunk support while EOB. Pt with significant forward posture while EOB. When cued to lift head up pt states I don't want to. Pt does not attempt to use either UE to support self in sitting. Postural control:  (  Significant anterior lean)                                  Communication Communication Communication: Impaired Factors Affecting Communication: Difficulty expressing self;Reduced clarity of speech  Cognition Arousal: Lethargic Behavior During Therapy: WFL for tasks assessed/performed   PT - Cognitive  impairments: Awareness, Safety/Judgement, Problem solving, Sequencing, Initiation, Difficult to assess Difficult to assess due to: Impaired communication                     PT - Cognition Comments: Pt repeating oh oh oh during session. Pt not following commands throughout session. Would open eyes and then close again. Following commands: Impaired Following commands impaired: Follows one step commands inconsistently    Cueing Cueing Techniques: Verbal cues, Tactile cues, Visual cues  Exercises General Exercises - Lower Extremity Long Arc Quad: PROM, Both, 5 reps, Seated Heel Slides: PROM, Both, 5 reps, Supine    General Comments General comments (skin integrity, edema, etc.): VSS throughout. No new skin abnormalities noted.      Pertinent Vitals/Pain Pain Assessment Pain Assessment: Faces Faces Pain Scale: Hurts a little bit Pain Location: Generalized Discomfort Pain Descriptors / Indicators: Aching, Sore Pain Intervention(s): Limited activity within patient's tolerance, Repositioned, Monitored during session    Home Living                          Prior Function            PT Goals (current goals can now be found in the care plan section) Acute Rehab PT Goals Patient Stated Goal: No new goals stated during session. Progress towards PT goals: Progressing toward goals (pt with decreased participation during PT sessions)    Frequency    Min 2X/week      PT Plan      Co-evaluation              AM-PAC PT 6 Clicks Mobility   Outcome Measure  Help needed turning from your back to your side while in a flat bed without using bedrails?: Total Help needed moving from lying on your back to sitting on the side of a flat bed without using bedrails?: Total Help needed moving to and from a bed to a chair (including a wheelchair)?: Total Help needed standing up from a chair using your arms (e.g., wheelchair or bedside chair)?: Total Help needed to  walk in hospital room?: Total Help needed climbing 3-5 steps with a railing? : Total 6 Click Score: 6    End of Session   Activity Tolerance: Patient limited by lethargy Patient left: in bed;with call bell/phone within reach;with family/visitor present Nurse Communication: Mobility status PT Visit Diagnosis: Muscle weakness (generalized) (M62.81);Other abnormalities of gait and mobility (R26.89) Hemiplegia - Right/Left: Left Hemiplegia - dominant/non-dominant: Non-dominant Hemiplegia - caused by: Cerebral infarction     Time: 1247-1311 PT Time Calculation (min) (ACUTE ONLY): 24 min  Charges:    $Therapeutic Activity: 23-37 mins PT General Charges $$ ACUTE PT VISIT: 1 Visit                     Sabra Morel, PT, DPT  Acute Rehabilitation Services         Office: [phone]     Sabra MARLA Morel 11/13/2024, 1:26 PM

## 2024-11-14 DIAGNOSIS — Z8719 Personal history of other diseases of the digestive system: Secondary | ICD-10-CM | POA: Diagnosis not present

## 2024-11-14 DIAGNOSIS — Z9889 Other specified postprocedural states: Secondary | ICD-10-CM | POA: Diagnosis not present

## 2024-11-14 LAB — GLUCOSE, CAPILLARY
Glucose-Capillary: 103 mg/dL — ABNORMAL HIGH (ref 70–99)
Glucose-Capillary: 132 mg/dL — ABNORMAL HIGH (ref 70–99)
Glucose-Capillary: 116 mg/dL — ABNORMAL HIGH (ref 70–99)
Glucose-Capillary: 100 mg/dL — ABNORMAL HIGH (ref 70–99)
Glucose-Capillary: 116 mg/dL — ABNORMAL HIGH (ref 70–99)
Glucose-Capillary: 117 mg/dL — ABNORMAL HIGH (ref 70–99)

## 2024-11-14 LAB — CBC
HCT: 28.1 % — ABNORMAL LOW (ref 36.0–46.0)
Hemoglobin: 9.5 g/dL — ABNORMAL LOW (ref 12.0–15.0)
MCH: 31.8 pg (ref 26.0–34.0)
MCHC: 33.8 g/dL (ref 30.0–36.0)
MCV: 94 fL (ref 80.0–100.0)
Platelets: 450 K/uL — ABNORMAL HIGH (ref 150–400)
RBC: 2.99 MIL/uL — ABNORMAL LOW (ref 3.87–5.11)
RDW: 15.6 % — ABNORMAL HIGH (ref 11.5–15.5)
WBC: 9 K/uL (ref 4.0–10.5)
nRBC: 0 % (ref 0.0–0.2)

## 2024-11-14 MED ORDER — LOPERAMIDE HCL 2 MG PO CAPS
2.0000 mg | ORAL_CAPSULE | ORAL | Status: DC | PRN
  Administered 2024-11-17 – 2024-11-20 (×2): 2 mg via ORAL
  Filled 2024-11-14: qty 1

## 2024-11-14 NOTE — Plan of Care (Signed)
 Progressing towards goals of care.     Problem: Education: Goal: Knowledge of General Education information will improve Description: Including pain rating scale, medication(s)/side effects and non-pharmacologic comfort measures Outcome: Progressing   Problem: Health Behavior/Discharge Planning: Goal: Ability to manage health-related needs will improve Outcome: Progressing   Problem: Clinical Measurements: Goal: Ability to maintain clinical measurements within normal limits will improve Outcome: Progressing Goal: Will remain free from infection Outcome: Progressing Goal: Diagnostic test results will improve Outcome: Progressing Goal: Respiratory complications will improve Outcome: Progressing Goal: Cardiovascular complication will be avoided Outcome: Progressing   Problem: Activity: Goal: Risk for activity intolerance will decrease Outcome: Progressing   Problem: Nutrition: Goal: Adequate nutrition will be maintained Outcome: Progressing   Problem: Coping: Goal: Level of anxiety will decrease Outcome: Progressing   Problem: Elimination: Goal: Will not experience complications related to bowel motility Outcome: Progressing Goal: Will not experience complications related to urinary retention Outcome: Progressing   Problem: Pain Managment: Goal: General experience of comfort will improve and/or be controlled Outcome: Progressing   Problem: Safety: Goal: Ability to remain free from injury will improve Outcome: Progressing   Problem: Skin Integrity: Goal: Risk for impaired skin integrity will decrease Outcome: Progressing   Problem: Activity: Goal: Ability to tolerate increased activity will improve Outcome: Progressing   Problem: Respiratory: Goal: Ability to maintain a clear airway and adequate ventilation will improve Outcome: Progressing   Problem: Role Relationship: Goal: Method of communication will improve Outcome: Progressing   Problem:  Education: Goal: Knowledge of disease or condition will improve Outcome: Progressing Goal: Knowledge of secondary prevention will improve (MUST DOCUMENT ALL) Outcome: Progressing Goal: Knowledge of patient specific risk factors will improve (DELETE if not current risk factor) Outcome: Progressing   Problem: Ischemic Stroke/TIA Tissue Perfusion: Goal: Complications of ischemic stroke/TIA will be minimized Outcome: Progressing   Problem: Coping: Goal: Will verbalize positive feelings about self Outcome: Progressing Goal: Will identify appropriate support needs Outcome: Progressing   Problem: Health Behavior/Discharge Planning: Goal: Ability to manage health-related needs will improve Outcome: Progressing Goal: Goals will be collaboratively established with patient/family Outcome: Progressing   Problem: Self-Care: Goal: Ability to participate in self-care as condition permits will improve Outcome: Progressing Goal: Verbalization of feelings and concerns over difficulty with self-care will improve Outcome: Progressing Goal: Ability to communicate needs accurately will improve Outcome: Progressing   Problem: Nutrition: Goal: Risk of aspiration will decrease Outcome: Progressing Goal: Dietary intake will improve Outcome: Progressing   Problem: Education: Goal: Knowledge of disease or condition will improve Outcome: Progressing Goal: Knowledge of secondary prevention will improve (MUST DOCUMENT ALL) Outcome: Progressing Goal: Knowledge of patient specific risk factors will improve (DELETE if not current risk factor) Outcome: Progressing   Problem: Ischemic Stroke/TIA Tissue Perfusion: Goal: Complications of ischemic stroke/TIA will be minimized Outcome: Progressing   Problem: Coping: Goal: Will verbalize positive feelings about self Outcome: Progressing Goal: Will identify appropriate support needs Outcome: Progressing   Problem: Health Behavior/Discharge  Planning: Goal: Ability to manage health-related needs will improve Outcome: Progressing Goal: Goals will be collaboratively established with patient/family Outcome: Progressing   Problem: Self-Care: Goal: Ability to participate in self-care as condition permits will improve Outcome: Progressing Goal: Verbalization of feelings and concerns over difficulty with self-care will improve Outcome: Progressing Goal: Ability to communicate needs accurately will improve Outcome: Progressing   Problem: Nutrition: Goal: Risk of aspiration will decrease Outcome: Progressing Goal: Dietary intake will improve Outcome: Progressing   Problem: Education: Goal: Knowledge of disease or condition will improve  Outcome: Progressing Goal: Knowledge of secondary prevention will improve (MUST DOCUMENT ALL) Outcome: Progressing Goal: Knowledge of patient specific risk factors will improve (DELETE if not current risk factor) Outcome: Progressing   Problem: Ischemic Stroke/TIA Tissue Perfusion: Goal: Complications of ischemic stroke/TIA will be minimized Outcome: Progressing

## 2024-11-14 NOTE — Progress Notes (Signed)
" °   11/14/24 1530  Vitals  BP (!) 140/83  BP Location Left Arm  BP Method Automatic  Level of Consciousness  Level of Consciousness Alert  MEWS COLOR  MEWS Score Color Green  Oxygen Therapy  O2 Device Room Air  MEWS Score  MEWS Temp 0  MEWS Systolic 0  MEWS Pulse 0  MEWS RR 0  MEWS LOC 0  MEWS Score 0   Getting BP med "

## 2024-11-14 NOTE — Progress Notes (Signed)
 Patient has eaten two jellos today with sips of grape juice- no BM this shift

## 2024-11-14 NOTE — Progress Notes (Signed)
 " PROGRESS NOTE    Erin [name]  FMW:[id number] DOB: September 25, 1956 DOA: 10/23/2024 PCP: Erin Heron [name], MD   Brief Narrative:  68 year old woman who presented to [facility] 12/3 for elective hernia repair. 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  Patient admitted initially for postop hernia repair found to have hemoperitoneum with hemorrhagic shock postoperatively requiring intubation and pressors.  Her course was subsequently complicated by bilateral strokes thought to be provoked by hypotension.  At this time patient is stabilizing, she has been extubated, continues tube feeds via NG and is otherwise stable from a surgical standpoint.  Transition out of the ICU to hospitalist team on 12/15 with plan for ongoing care with ultimate plan for disposition pending patient's clinical course.  Hospital course 12/3 admitted to ICU postop 12/3-post hernia repair 12/4 taken back to the OR for hemoperitoneum-active bleed was not identified, abdomen packed with wound VAC in place 12/5 back to the OR for an open abdomen-no evidence of ongoing bleeding and abdomen was closed 12/8 - CT Head with interval development (since 04/2024) of multiple cortical and subcortical hypodensities in bilateral cerebellar and cerebral hemispheres suggestive of acute/subacute infarcts. MRI Brain with numerous areas of restricted diffusion throughout the cerebral hemispheres/cerebellum c/f acute infarcts of embolic etiology, associated mild edema (R occipital lobe, L temporal lobe).  12/11 tolerating PSV 5/5 >> extubated 12/15 transition to TRH team - goals of care discussion with husband(see below) 12/17 General surgery evaluating for PEG -pending SLP re-evaluation in hopes she will be able to  improve -LTAC was denied by insurance  Assessment & Plan:    Hemorrhagic shock from hypovolemia caused during ex lap hernia repair SHOCK - required stay in ICU, pressors, received massive blood transfusion protocol with 5 units of packed RBC and 2 units of cryoprecipitate around 10/23/2024, unfortunately incurred anoxic brain injury/CVA, brief support with intubation.  Shock has resolved, monitor with supportive care.  Acute bilateral cerebral and cerebellar infarcts, causing encephalopathy, aphasia, dysphagia - CT head with multiple cortical and subcortical hypodensities, confirmed by MRI, likely due to profound hypotension from #1 above, seen by neuro, patient remains minimally verbal, has dysphagia requiring NG tube feeds for 3 weeks.  She was being prepped for PEG tube placement however showed considerable improvement in her swallowing on 11/11/2024, seen by speech and started on oral diet, but overall her oral intake remains poor, so even if she improves regarding dysphagia but she will always have an issue of enough calories/nutrition intake, so she will benefit from PEG as I have discussed with husband, so IR has been reengaged to evaluate for PEG.  Aspirin  need to be on hold for 5 days, so plan for PEG is for next Tuesday    HTN  - Continue Amlodipine , Carvedilol , added hydralazine  for better blood pressure control, continue as needed IV hydralazine .  Goal blood pressure is labile but more controlled than not so I will hold on increasing on her scheduled meds.    S/p hernia repair, elective 12/3, S/p ex-lap 12/4, 12/5  - Postoperative management per CCS,  Wound care per surgery   Acute kidney injury to ATN from shock.  History of left renal artery stenosis.  Renal function improving.  Monitor   Acute respiratory insufficiency , extubated 12/11 - happened due to anoxic brain injury/CVA.  Resolved.  HCAP - Enterobacter 12/6  - Completed antibiotic treatment.  History of peripheral  neuropathy and itching.  Usually takes high doses of Neurontin  at home, cut down as she has some encephalopathy on 11/09/2024, reduce sedative medications  Mild metabolic encephalopathy.  See above.  Reduce sedating medications including Neurontin .   DVT prophylaxis: heparin  injection 5,000 Units Start: 10/28/24 2200 SCD's Start: 10/23/24 1918 Code Status:   Code Status: Full Code Family Communication: Husband at bedside   Status is: Inpt  Dispo: Home with home health per patient's husband  Consultants:  PCCM, General surgery, Neuro  Procedures:  Hernia repair 12/3 and ex-lap 12/5   Antimicrobials:  None(completed prior course)   Subjective:  Patient is more awake today, more interactive today, per husband she had 1 episode of loose stool yesterday, but none today.    Objective: Vitals:   11/14/24 0541 11/14/24 0800 11/14/24 0801 11/14/24 1134  BP: (!) 172/72 (!) 143/69  137/60  Pulse: 77 84  85  Resp: 18 16  19   Temp: 98 F (36.7 C) 97.8 F (36.6 C)  97.6 F (36.4 C)  TempSrc: Axillary Oral  Axillary  SpO2: 95%   94%  Weight:   43.7 kg   Height:        Intake/Output Summary (Last 24 hours) at 11/14/2024 1424 Last data filed at 11/14/2024 1255 Gross per 24 hour  Intake 830 ml  Output 2200 ml  Net -1370 ml   Filed Weights   11/12/24 0500 11/13/24 0500 11/14/24 0801  Weight: 45.1 kg 43.5 kg 43.7 kg    Examination:   Awake, open eyes, more interactive and communicative today, tried to verbalize words frail CTAB RRR +ve B.Sounds. Abdomen soft No Cyanosis, Clubbing or edema, No new Rash or bruise       Data Review:   Patient Lines/Drains/Airways Status     Active Line/Drains/Airways     Name Placement date Placement time Site Days   Peripheral IV 10/23/24 20 G 1.75 Anterior;Left Forearm 10/23/24  2037  Forearm  22   External Urinary Catheter 11/01/24  1900  --  13   Small Bore Feeding Tube Left nare Marking at nare/corner of mouth 68 cm 11/01/24   1536  Left nare  13   Wound 10/23/24 0923 Surgical Closed Surgical Incision Abdomen 10/23/24  0923  Abdomen  22   Wound 10/23/24 0923 Surgical Laparoscopic Abdomen 1: Upper;Left 2: Left;Lower 10/23/24  0923  Abdomen  22   Wound 11/01/24 1600 Irritant Contact Dermatitis Buttocks 11/01/24  1600  Buttocks  13             Inpatient Medications  Scheduled Meds:  amLODipine   10 mg Per Tube Daily   carvedilol   12.5 mg Per Tube BID WC   Chlorhexidine  Gluconate Cloth  6 each Topical Daily   feeding supplement  237 mL Oral BID BM   feeding supplement (KATE FARMS STANDARD 1.4) Liquid  960 mL Per Tube Q24H   feeding supplement (PROSource TF20)  60 mL Per Tube Daily   free water   200 mL Per Tube Q6H   gabapentin   100 mg Per Tube Q12H   Gerhardt's butt cream   Topical BID   heparin  injection (subcutaneous)  5,000 Units Subcutaneous Q8H   hydrALAZINE   50 mg Per Tube Q8H   Influenza vac split trivalent PF  0.5 mL Intramuscular Tomorrow-1000   levothyroxine   75 mcg Per Tube Q0600   methylphenidate   5 mg Oral q morning  mouth rinse  15 mL Mouth Rinse 4 times per day   pantoprazole  (PROTONIX ) IV  40 mg Intravenous QHS   Continuous Infusions:   PRN Meds:.acetaminophen , hydrALAZINE , labetalol , ondansetron  **OR** ondansetron  (ZOFRAN ) IV, mouth rinse, traMADol   DVT Prophylaxis  heparin  injection 5,000 Units Start: 10/28/24 2200 SCD's Start: 10/23/24 1918   Recent Labs  Lab 11/09/24 0453 11/10/24 0408 11/11/24 0334 11/12/24 0327 11/13/24 0155 11/14/24 1151  WBC 7.4 6.7 6.7 6.8 8.2 9.0  HGB 8.0* 8.4* 8.4* 8.5* 9.1* 9.5*  HCT 24.7* 26.1* 26.2* 25.9* 28.0* 28.1*  PLT 577* 558* 536* 510* 469* 450*  MCV 95.7 96.3 96.0 96.3 96.9 94.0  MCH 31.0 31.0 30.8 31.6 31.5 31.8  MCHC 32.4 32.2 32.1 32.8 32.5 33.8  RDW 16.6* 16.9* 16.4* 16.0* 16.0* 15.6*  LYMPHSABS 0.9 1.0 0.9 1.1 1.2  --   MONOABS 0.8 0.9 0.9 1.0 1.1*  --   EOSABS 0.1 0.1 0.1 0.1 0.1  --   BASOSABS 0.0 0.0 0.0 0.0 0.0  --      Recent Labs  Lab 11/08/24 0437 11/09/24 0453 11/10/24 0408 11/11/24 0334 11/12/24 0327 11/13/24 0155  NA 136 137 136 136 135 136  K 4.0 4.4 4.9 4.5 4.7 4.6  CL 106 106 104 102 102 101  CO2 18* 21* 21* 22 23 24   ANIONGAP 12 10 11 13 10 11   GLUCOSE 105* 111* 105* 104* 103* 108*  BUN 38* 40* 47* 50* 51* 56*  CREATININE 1.28* 1.43* 1.60* 1.56* 1.48* 1.50*  AST 19 16 17 18 16 22   ALT 11 8 10 12 10 13   ALKPHOS 87 84 83 83 88 91  BILITOT <0.2 <0.2 <0.2 0.2 0.3 0.2  ALBUMIN  3.1* 3.2* 3.2* 3.4* 3.5 3.6  MG 1.9 1.9 2.1 2.2  --  2.3  PHOS 4.2 5.0* 5.0* 4.4  --  4.5  CALCIUM  8.8* 8.7* 9.0 9.2 9.2 9.6      Recent Labs  Lab 11/08/24 0437 11/09/24 0453 11/10/24 0408 11/11/24 0334 11/12/24 0327 11/13/24 0155  MG 1.9 1.9 2.1 2.2  --  2.3  CALCIUM  8.8* 8.7* 9.0 9.2 9.2 9.6    --------------------------------------------------------------------------------------------------------------- Lab Results  Component Value Date   CHOL 91 10/29/2024   HDL 12 (L) 10/29/2024   LDLCALC 13 10/29/2024   LDLDIRECT 70.0 04/07/2021   TRIG 331 (H) 10/29/2024   CHOLHDL 7.6 10/29/2024    Lab Results  Component Value Date   HGBA1C 4.9 10/18/2024   No results for input(s): TSH, T4TOTAL, FREET4, T3FREE, THYROIDAB in the last 72 hours. No results for input(s): VITAMINB12, FOLATE, FERRITIN, TIBC, IRON, RETICCTPCT in the last 72 hours. ------------------------------------------------------------------------------------------------------------------ Cardiac Enzymes No results for input(s): CKMB, TROPONINI, MYOGLOBIN in the last 168 hours.  Invalid input(s): CK  Micro Results No results found for this or any previous visit (from the past 240 hours).   Radiology Reports  CT ABDOMEN WO CONTRAST Result Date: 11/13/2024 EXAM: CT ABDOMEN WITHOUT CONTRAST 11/13/2024 01:49:00 PM TECHNIQUE: CT of the abdomen was performed without the administration of intravenous  contrast. Multiplanar reformatted images are provided for review. Automated exposure control, iterative reconstruction, and/or weight based adjustment of the mA/kV was utilized to reduce the radiation dose to as low as reasonably achievable. COMPARISON: 10/24/2024 and previous. CLINICAL HISTORY: Anatomy evaluation for G-tube placement in IR. FINDINGS: LOWER CHEST: Interval improvement in small pleural effusions with resolving patchy atelectasis/consolidation posteriorly at both lung bases, worse left than right. Blood pool is hypodense compared to the interventricular septum  suggesting anemia. HEPATOBILIARY: The visualized liver is unremarkable. Gallbladder is unremarkable. SPLEEN: Spleen demonstrates no acute abnormality. PANCREAS: Pancreas demonstrates no acute abnormality. ADRENAL GLANDS: Adrenal glands demonstrate no acute abnormality. KIDNEYS: 3 mm calcification peripherally in the upper pole left renal collecting system. No hydronephrosis. No perinephric or periureteral stranding. GI AND BOWEL: Feeding tube extends to the proximal duodenum. There is safe percutaneous window for gastrostomy placement. Residual contrast in the visualized colon, which is nondilated. There is no bowel obstruction. No abnormal bowel wall thickening or distension. PERITONEUM AND RETROPERITONEUM: Aorta is normal in caliber. Origin stenting of celiac access and SMA. Post aortobifem. No ascites or free air. LYMPH NODES: No lymphadenopathy. BONES AND SOFT TISSUES: No acute abnormality of the visualized bones. No focal soft tissue abnormality. IMPRESSION: 1. Safe percutaneous window for gastrostomy placement. 2. Findings suggest anemia. Electronically signed by: Dayne Hassell MD 11/13/2024 04:01 PM EST RP Workstation: [id number]        Signature  -   Brayton Lye M.D on 11/14/2024 at 2:24 PM   -  To page go to [email or web address]     "

## 2024-11-15 DIAGNOSIS — Z9889 Other specified postprocedural states: Secondary | ICD-10-CM | POA: Diagnosis not present

## 2024-11-15 DIAGNOSIS — Z8719 Personal history of other diseases of the digestive system: Secondary | ICD-10-CM | POA: Diagnosis not present

## 2024-11-15 LAB — BASIC METABOLIC PANEL WITH GFR
Anion gap: 11 (ref 5–15)
BUN: 56 mg/dL — ABNORMAL HIGH (ref 8–23)
CO2: 24 mmol/L (ref 22–32)
Calcium: 9.2 mg/dL (ref 8.9–10.3)
Chloride: 98 mmol/L (ref 98–111)
Creatinine, Ser: 1.57 mg/dL — ABNORMAL HIGH (ref 0.44–1.00)
GFR, Estimated: 36 mL/min — ABNORMAL LOW
Glucose, Bld: 114 mg/dL — ABNORMAL HIGH (ref 70–99)
Potassium: 4 mmol/L (ref 3.5–5.1)
Sodium: 134 mmol/L — ABNORMAL LOW (ref 135–145)

## 2024-11-15 LAB — CBC
HCT: 27.1 % — ABNORMAL LOW (ref 36.0–46.0)
Hemoglobin: 9.1 g/dL — ABNORMAL LOW (ref 12.0–15.0)
MCH: 31.6 pg (ref 26.0–34.0)
MCHC: 33.6 g/dL (ref 30.0–36.0)
MCV: 94.1 fL (ref 80.0–100.0)
Platelets: 433 K/uL — ABNORMAL HIGH (ref 150–400)
RBC: 2.88 MIL/uL — ABNORMAL LOW (ref 3.87–5.11)
RDW: 15.7 % — ABNORMAL HIGH (ref 11.5–15.5)
WBC: 6.6 K/uL (ref 4.0–10.5)
nRBC: 0 % (ref 0.0–0.2)

## 2024-11-15 LAB — PROTIME-INR
INR: 1 (ref 0.8–1.2)
Prothrombin Time: 14 s (ref 11.4–15.2)

## 2024-11-15 LAB — GLUCOSE, CAPILLARY
Glucose-Capillary: 112 mg/dL — ABNORMAL HIGH (ref 70–99)
Glucose-Capillary: 123 mg/dL — ABNORMAL HIGH (ref 70–99)
Glucose-Capillary: 120 mg/dL — ABNORMAL HIGH (ref 70–99)
Glucose-Capillary: 134 mg/dL — ABNORMAL HIGH (ref 70–99)
Glucose-Capillary: 112 mg/dL — ABNORMAL HIGH (ref 70–99)

## 2024-11-15 NOTE — Plan of Care (Signed)
 Patient is progressing towards goals of care.     Problem: Education: Goal: Knowledge of General Education information will improve Description: Including pain rating scale, medication(s)/side effects and non-pharmacologic comfort measures Outcome: Progressing   Problem: Health Behavior/Discharge Planning: Goal: Ability to manage health-related needs will improve Outcome: Progressing   Problem: Clinical Measurements: Goal: Ability to maintain clinical measurements within normal limits will improve Outcome: Progressing Goal: Will remain free from infection Outcome: Progressing Goal: Diagnostic test results will improve Outcome: Progressing Goal: Respiratory complications will improve Outcome: Progressing Goal: Cardiovascular complication will be avoided Outcome: Progressing   Problem: Activity: Goal: Risk for activity intolerance will decrease Outcome: Progressing   Problem: Nutrition: Goal: Adequate nutrition will be maintained Outcome: Progressing   Problem: Coping: Goal: Level of anxiety will decrease Outcome: Progressing   Problem: Elimination: Goal: Will not experience complications related to bowel motility Outcome: Progressing Goal: Will not experience complications related to urinary retention Outcome: Progressing   Problem: Pain Managment: Goal: General experience of comfort will improve and/or be controlled Outcome: Progressing   Problem: Safety: Goal: Ability to remain free from injury will improve Outcome: Progressing   Problem: Skin Integrity: Goal: Risk for impaired skin integrity will decrease Outcome: Progressing   Problem: Activity: Goal: Ability to tolerate increased activity will improve Outcome: Progressing   Problem: Respiratory: Goal: Ability to maintain a clear airway and adequate ventilation will improve Outcome: Progressing   Problem: Role Relationship: Goal: Method of communication will improve Outcome: Progressing    Problem: Education: Goal: Knowledge of disease or condition will improve Outcome: Progressing Goal: Knowledge of secondary prevention will improve (MUST DOCUMENT ALL) Outcome: Progressing Goal: Knowledge of patient specific risk factors will improve (DELETE if not current risk factor) Outcome: Progressing   Problem: Ischemic Stroke/TIA Tissue Perfusion: Goal: Complications of ischemic stroke/TIA will be minimized Outcome: Progressing   Problem: Coping: Goal: Will verbalize positive feelings about self Outcome: Progressing Goal: Will identify appropriate support needs Outcome: Progressing   Problem: Health Behavior/Discharge Planning: Goal: Ability to manage health-related needs will improve Outcome: Progressing Goal: Goals will be collaboratively established with patient/family Outcome: Progressing   Problem: Self-Care: Goal: Ability to participate in self-care as condition permits will improve Outcome: Progressing Goal: Verbalization of feelings and concerns over difficulty with self-care will improve Outcome: Progressing Goal: Ability to communicate needs accurately will improve Outcome: Progressing   Problem: Nutrition: Goal: Risk of aspiration will decrease Outcome: Progressing Goal: Dietary intake will improve Outcome: Progressing   Problem: Education: Goal: Knowledge of disease or condition will improve Outcome: Progressing Goal: Knowledge of secondary prevention will improve (MUST DOCUMENT ALL) Outcome: Progressing Goal: Knowledge of patient specific risk factors will improve (DELETE if not current risk factor) Outcome: Progressing   Problem: Ischemic Stroke/TIA Tissue Perfusion: Goal: Complications of ischemic stroke/TIA will be minimized Outcome: Progressing   Problem: Coping: Goal: Will verbalize positive feelings about self Outcome: Progressing Goal: Will identify appropriate support needs Outcome: Progressing   Problem: Health Behavior/Discharge  Planning: Goal: Ability to manage health-related needs will improve Outcome: Progressing Goal: Goals will be collaboratively established with patient/family Outcome: Progressing   Problem: Self-Care: Goal: Ability to participate in self-care as condition permits will improve Outcome: Progressing Goal: Verbalization of feelings and concerns over difficulty with self-care will improve Outcome: Progressing Goal: Ability to communicate needs accurately will improve Outcome: Progressing   Problem: Nutrition: Goal: Risk of aspiration will decrease Outcome: Progressing Goal: Dietary intake will improve Outcome: Progressing   Problem: Education: Goal: Knowledge of disease or condition  will improve Outcome: Progressing Goal: Knowledge of secondary prevention will improve (MUST DOCUMENT ALL) Outcome: Progressing Goal: Knowledge of patient specific risk factors will improve (DELETE if not current risk factor) Outcome: Progressing   Problem: Ischemic Stroke/TIA Tissue Perfusion: Goal: Complications of ischemic stroke/TIA will be minimized Outcome: Progressing

## 2024-11-15 NOTE — Plan of Care (Signed)
" °  Problem: Coping: Goal: Will verbalize positive feelings about self Outcome: Progressing   Problem: Health Behavior/Discharge Planning: Goal: Ability to manage health-related needs will improve Outcome: Progressing   Problem: Self-Care: Goal: Ability to participate in self-care as condition permits will improve Outcome: Progressing   Problem: Nutrition: Goal: Risk of aspiration will decrease Outcome: Progressing   Problem: Coping: Goal: Will verbalize positive feelings about self Outcome: Progressing Goal: Will identify appropriate support needs Outcome: Progressing   "

## 2024-11-15 NOTE — Progress Notes (Signed)
 " PROGRESS NOTE    Erin [name]  FMW:[id number] DOB: February 14, 1956 DOA: 10/23/2024 PCP: [name] [name], MD   Brief Narrative:  68 year old woman who presented to [facility] 12/3 for elective hernia repair. 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  Patient admitted initially for postop hernia repair found to have hemoperitoneum with hemorrhagic shock postoperatively requiring intubation and pressors.  Her course was subsequently complicated by bilateral strokes thought to be provoked by hypotension.  At this time patient is stabilizing, she has been extubated, continues tube feeds via NG and is otherwise stable from a surgical standpoint.  Transition out of the ICU to hospitalist team on 12/15 with plan for ongoing care with ultimate plan for disposition pending patient's clinical course.  Hospital course 12/3 admitted to ICU postop 12/3-post hernia repair 12/4 taken back to the OR for hemoperitoneum-active bleed was not identified, abdomen packed with wound VAC in place 12/5 back to the OR for an open abdomen-no evidence of ongoing bleeding and abdomen was closed 12/8 - CT Head with interval development (since 04/2024) of multiple cortical and subcortical hypodensities in bilateral cerebellar and cerebral hemispheres suggestive of acute/subacute infarcts. MRI Brain with numerous areas of restricted diffusion throughout the cerebral hemispheres/cerebellum c/f acute infarcts of embolic etiology, associated mild edema (R occipital lobe, L temporal lobe).  12/11 tolerating PSV 5/5 >> extubated 12/15 transition to TRH team - goals of care discussion with husband(see below) 12/17 General surgery evaluating for PEG -pending SLP re-evaluation in hopes she will be able to  improve -LTAC was denied by insurance  Assessment & Plan:    Hemorrhagic shock from hypovolemia caused during ex lap hernia repair SHOCK - required stay in ICU, pressors, received massive blood transfusion protocol with 5 units of packed RBC and 2 units of cryoprecipitate around 10/23/2024, unfortunately incurred anoxic brain injury/CVA, brief support with intubation.  Shock has resolved, monitor with supportive care.  Acute bilateral cerebral and cerebellar infarcts, causing encephalopathy, aphasia, dysphagia - CT head with multiple cortical and subcortical hypodensities, confirmed by MRI, likely due to profound hypotension from #1 above, seen by neuro, patient remains minimally verbal, has dysphagia requiring NG tube feeds for 3 weeks.  She was being prepped for PEG tube placement however showed considerable improvement in her swallowing on 11/11/2024, seen by speech and started on oral diet, but overall her oral intake remains poor, so even if she improves regarding dysphagia but she will always have an issue of enough calories/nutrition intake, so she will benefit from PEG as I have discussed with husband, so IR has been reengaged to evaluate for PEG.  Aspirin  need to be on hold for 5 days, so plan for PEG is for next Tuesday    HTN  - Continue Amlodipine , Carvedilol , added hydralazine  for better blood pressure control, continue as needed IV hydralazine .  Goal blood pressure is labile but more controlled than not so I will hold on increasing on her scheduled meds.    S/p hernia repair, elective 12/3, S/p ex-lap 12/4, 12/5  - Postoperative management per CCS,  Wound care per surgery   Acute kidney injury to ATN from shock.  History of left renal artery stenosis.  Renal function improving.  Monitor   Acute respiratory insufficiency , extubated 12/11 - happened due to anoxic brain injury/CVA.  Resolved.  HCAP - Enterobacter 12/6  - Completed antibiotic treatment.  History of peripheral  neuropathy and itching.  Usually takes high doses of Neurontin  at home, cut down as she has some encephalopathy on 11/09/2024, reduce sedative medications  Mild metabolic encephalopathy.  See above.  Reduce sedating medications including Neurontin .   DVT prophylaxis: heparin  injection 5,000 Units Start: 10/28/24 2200 SCD's Start: 10/23/24 1918 Code Status:   Code Status: Full Code Family Communication: Husband at bedside   Status is: Inpt  Dispo: Home with home health per patient's husband  Consultants:  PCCM, General surgery, Neuro, IR  Procedures:  Hernia repair 12/3 and ex-lap 12/5   Antimicrobials:  None(completed prior course)   Subjective:   She is more awake interactive and communicative today, no BM yesterday, she had 1 BM today  Objective: Vitals:   11/14/24 2317 11/15/24 0424 11/15/24 0805 11/15/24 1146  BP: (!) 126/59  129/63 128/81  Pulse:      Resp: 17  18   Temp: 98.3 F (36.8 C)  97.6 F (36.4 C) 97.8 F (36.6 C)  TempSrc: Oral  Axillary Oral  SpO2:      Weight:  46.3 kg    Height:        Intake/Output Summary (Last 24 hours) at 11/15/2024 1417 Last data filed at 11/15/2024 1228 Gross per 24 hour  Intake 800 ml  Output 1150 ml  Net -350 ml   Filed Weights   11/13/24 0500 11/14/24 0801 11/15/24 0424  Weight: 43.5 kg 43.7 kg 46.3 kg    Examination:   Awake, alert, she is more interactive today answering some questions  Good air entry bilaterally RRR Abd soft, abdominal surgical wound bandaged, C/D/I No Cyanosis, Clubbing or edema, No new Rash or bruise       Data Review:   Patient Lines/Drains/Airways Status     Active Line/Drains/Airways     Name Placement date Placement time Site Days   Peripheral IV 11/15/24 20 G Anterior;Left Forearm 11/15/24  0315  Forearm  less than 1   External Urinary Catheter 11/01/24  1900  --  14   Small Bore Feeding Tube Left nare Marking at nare/corner of mouth 68 cm 11/01/24  1536  Left nare  14    Wound 10/23/24 0923 Surgical Closed Surgical Incision Abdomen 10/23/24  0923  Abdomen  23   Wound 10/23/24 0923 Surgical Laparoscopic Abdomen 1: Upper;Left 2: Left;Lower 10/23/24  0923  Abdomen  23   Wound 11/01/24 1600 Irritant Contact Dermatitis Buttocks 11/01/24  1600  Buttocks  14             Inpatient Medications  Scheduled Meds:  amLODipine   10 mg Per Tube Daily   carvedilol   12.5 mg Per Tube BID WC   Chlorhexidine  Gluconate Cloth  6 each Topical Daily   feeding supplement (KATE FARMS STANDARD 1.4) Liquid  960 mL Per Tube Q24H   feeding supplement (PROSource TF20)  60 mL Per Tube Daily   free water   200 mL Per Tube Q6H   gabapentin   100 mg Per Tube Q12H   Gerhardt's butt cream   Topical BID   heparin  injection (subcutaneous)  5,000 Units Subcutaneous Q8H   hydrALAZINE   50 mg Per Tube Q8H   Influenza vac split trivalent PF  0.5 mL Intramuscular Tomorrow-1000   levothyroxine   75 mcg Per Tube Q0600   methylphenidate   5 mg Oral q morning   mouth rinse  15 mL Mouth Rinse 4 times  per day   pantoprazole  (PROTONIX ) IV  40 mg Intravenous QHS   Continuous Infusions:   PRN Meds:.acetaminophen , hydrALAZINE , labetalol , loperamide , ondansetron  **OR** ondansetron  (ZOFRAN ) IV, mouth rinse, traMADol   DVT Prophylaxis  heparin  injection 5,000 Units Start: 10/28/24 2200 SCD's Start: 10/23/24 1918   Recent Labs  Lab 11/09/24 0453 11/10/24 0408 11/11/24 0334 11/12/24 0327 11/13/24 0155 11/14/24 1151 11/15/24 0525  WBC 7.4 6.7 6.7 6.8 8.2 9.0 6.6  HGB 8.0* 8.4* 8.4* 8.5* 9.1* 9.5* 9.1*  HCT 24.7* 26.1* 26.2* 25.9* 28.0* 28.1* 27.1*  PLT 577* 558* 536* 510* 469* 450* 433*  MCV 95.7 96.3 96.0 96.3 96.9 94.0 94.1  MCH 31.0 31.0 30.8 31.6 31.5 31.8 31.6  MCHC 32.4 32.2 32.1 32.8 32.5 33.8 33.6  RDW 16.6* 16.9* 16.4* 16.0* 16.0* 15.6* 15.7*  LYMPHSABS 0.9 1.0 0.9 1.1 1.2  --   --   MONOABS 0.8 0.9 0.9 1.0 1.1*  --   --   EOSABS 0.1 0.1 0.1 0.1 0.1  --   --   BASOSABS 0.0 0.0  0.0 0.0 0.0  --   --     Recent Labs  Lab 11/09/24 0453 11/10/24 0408 11/11/24 0334 11/12/24 0327 11/13/24 0155 11/15/24 0525  NA 137 136 136 135 136 134*  K 4.4 4.9 4.5 4.7 4.6 4.0  CL 106 104 102 102 101 98  CO2 21* 21* 22 23 24 24   ANIONGAP 10 11 13 10 11 11   GLUCOSE 111* 105* 104* 103* 108* 114*  BUN 40* 47* 50* 51* 56* 56*  CREATININE 1.43* 1.60* 1.56* 1.48* 1.50* 1.57*  AST 16 17 18 16 22   --   ALT 8 10 12 10 13   --   ALKPHOS 84 83 83 88 91  --   BILITOT <0.2 <0.2 0.2 0.3 0.2  --   ALBUMIN  3.2* 3.2* 3.4* 3.5 3.6  --   INR  --   --   --   --   --  1.0  MG 1.9 2.1 2.2  --  2.3  --   PHOS 5.0* 5.0* 4.4  --  4.5  --   CALCIUM  8.7* 9.0 9.2 9.2 9.6 9.2      Recent Labs  Lab 11/09/24 0453 11/10/24 0408 11/11/24 0334 11/12/24 0327 11/13/24 0155 11/15/24 0525  INR  --   --   --   --   --  1.0  MG 1.9 2.1 2.2  --  2.3  --   CALCIUM  8.7* 9.0 9.2 9.2 9.6 9.2    --------------------------------------------------------------------------------------------------------------- Lab Results  Component Value Date   CHOL 91 10/29/2024   HDL 12 (L) 10/29/2024   LDLCALC 13 10/29/2024   LDLDIRECT 70.0 04/07/2021   TRIG 331 (H) 10/29/2024   CHOLHDL 7.6 10/29/2024    Lab Results  Component Value Date   HGBA1C 4.9 10/18/2024   No results for input(s): TSH, T4TOTAL, FREET4, T3FREE, THYROIDAB in the last 72 hours. No results for input(s): VITAMINB12, FOLATE, FERRITIN, TIBC, IRON, RETICCTPCT in the last 72 hours. ------------------------------------------------------------------------------------------------------------------ Cardiac Enzymes No results for input(s): CKMB, TROPONINI, MYOGLOBIN in the last 168 hours.  Invalid input(s): CK  Micro Results No results found for this or any previous visit (from the past 240 hours).   Radiology Reports  No results found.       Signature  -   Brayton Lye M.D on 11/15/2024 at 2:17 PM   -   To page go to [email or web address]     "

## 2024-11-15 NOTE — Progress Notes (Addendum)
 Physical Therapy Treatment Patient Details Name: Erin [name] MRN: [id number] DOB: [birth year] Today's Date: 11/15/2024   History of Present Illness 69 y.o. F  adm to [facility] 12/3 for ventral hernia repair. 12/4 ex lap with abdominal hemorrhage and remained intubated post op. 12/5 abdominal closure. 12/8 MRI with multiple embolic infarcts. 12/9 transfer to [facility]. 12/11 extubated. PMHx: anal CA, hypothyroidism, GERD, CAD, cardiomyopathy, PVD, HLD, HTN, RLS.    PT Comments  Pt demonstrates significant progress in functional mobility this session. Pt much more alert and interactive throughout session. Pt follows simple cues consistently throughout session, occasionally requiring additional time to complete the task. Pt requires min-modA for bed mobility for trunk control and light LE management. Pt steady upon sitting EOB and demonstrates improved trunk control with mild postural sway that pt can mostly correct without assist. Pt requires minAx2 with hand held assist to complete STS and step over to chair.  Pt would benefit from continued PT services focused on strength, balance, and progressing towards gait to build activity tolerance and promote independence with mobility.   If plan is discharge home, recommend the following: A lot of help with walking and/or transfers;A lot of help with bathing/dressing/bathroom;Assistance with cooking/housework;Direct supervision/assist for medications management;Direct supervision/assist for financial management;Assist for transportation;Help with stairs or ramp for entrance;Supervision due to cognitive status   Can travel by private vehicle     No  Equipment Recommendations  Rolling walker (2 wheels);BSC/3in1    Recommendations for Other Services       Precautions / Restrictions Precautions Precautions: Fall Recall of Precautions/Restrictions: Impaired Restrictions Weight Bearing Restrictions Per Provider Order: No     Mobility  Bed Mobility Overal  bed mobility: Needs Assistance Bed Mobility: Supine to Sit, Sit to Supine Rolling: Modified independent (Device/Increase time)   Supine to sit: Min assist, Mod assist Sit to supine: Mod assist   General bed mobility comments: Pt rolling independently in bed to find position of comfort. Pt follows cues to bring legs EOB and completes 50%, pt pushes through elbows to bring trunk upright but is unable to maintain position, min-modA required to complete remaining 50% of transfer.    Transfers Overall transfer level: Needs assistance Equipment used: 2 person hand held assist Transfers: Sit to/from Stand, Bed to chair/wheelchair/BSC Sit to Stand: Min assist   Step pivot transfers: Min assist, +2 physical assistance       General transfer comment: Pt unsteady upon standing and fatigues quickly. STS x3 completed during session. Pt benefits from hand held assist x2 to take small steps towards chair. Pt attempts to initiate stance without assist, light assist needed for hip extension and balance.    Ambulation/Gait Ambulation/Gait assistance: Min assist, +2 physical assistance Gait Distance (Feet): 2 Feet Assistive device: 2 person hand held assist Gait Pattern/deviations: Shuffle, Decreased step length - right, Decreased step length - left   Gait velocity interpretation: <1.31 ft/sec, indicative of household ambulator   General Gait Details: Pt takes ~6 small, shuffled steps over to chair with hand held assist. Cues needed throughout for positoning and safety due to pt fatiguing quickly and requesting to sit down.   Stairs             Wheelchair Mobility     Tilt Bed    Modified Rankin (Stroke Patients Only)       Balance Overall balance assessment: Needs assistance Sitting-balance support: Bilateral upper extremity supported Sitting balance-Leahy Scale: Fair Sitting balance - Comments: Pt demonstrates weakness with trunk control,  often swaying in multi-directions. Pt  with decreased trunk lean this session and was able to correct either independently or with light tactile cuing.   Standing balance support: Bilateral upper extremity supported Standing balance-Leahy Scale: Fair Standing balance comment: Pt unsteady due to weakness and fatigue, benefits from hand held assist x2 to maintain balance. No knee buckling or LOB noted during session.                            Communication Communication Communication: Impaired Factors Affecting Communication: Difficulty expressing self;Reduced clarity of speech  Cognition Arousal: Alert Behavior During Therapy: WFL for tasks assessed/performed   PT - Cognitive impairments: Awareness, Safety/Judgement, Problem solving                         Following commands: Intact Following commands impaired: Only follows one step commands consistently    Cueing Cueing Techniques: Verbal cues, Tactile cues, Visual cues  Exercises General Exercises - Lower Extremity Ankle Circles/Pumps: AROM, 10 reps, Both, Seated Long Arc Quad: AROM, Both, 10 reps, Seated    General Comments General comments (skin integrity, edema, etc.): VSS throughout. No new skin abnormalities noted.      Pertinent Vitals/Pain Pain Assessment Pain Assessment: No/denies pain    Home Living                          Prior Function            PT Goals (current goals can now be found in the care plan section) Acute Rehab PT Goals Patient Stated Goal: No additional goals stated during session Progress towards PT goals: Progressing toward goals    Frequency    Min 3X/week      PT Plan      Co-evaluation              AM-PAC PT 6 Clicks Mobility   Outcome Measure  Help needed turning from your back to your side while in a flat bed without using bedrails?: None Help needed moving from lying on your back to sitting on the side of a flat bed without using bedrails?: A Little Help needed moving  to and from a bed to a chair (including a wheelchair)?: A Little Help needed standing up from a chair using your arms (e.g., wheelchair or bedside chair)?: A Little Help needed to walk in hospital room?: Total Help needed climbing 3-5 steps with a railing? : Total 6 Click Score: 15    End of Session   Activity Tolerance: Patient limited by fatigue Patient left: in chair;with call bell/phone within reach;with chair alarm set;with family/visitor present (Returned after session to assist pt back to bed with RN) Nurse Communication: Mobility status PT Visit Diagnosis: Unsteadiness on feet (R26.81);Muscle weakness (generalized) (M62.81)     Time: 1359-1430 PT Time Calculation (min) (ACUTE ONLY): 31 min  Charges:    $Gait Training: 8-22 mins $Therapeutic Activity: 8-22 mins PT General Charges $$ ACUTE PT VISIT: 1 Visit                     Sabra Morel, PT, DPT  Acute Rehabilitation Services         Office: [phone]      Sabra MARLA Morel 11/15/2024, 3:55 PM

## 2024-11-15 NOTE — Progress Notes (Signed)
 Speech Language Pathology Treatment: Dysphagia  Patient Details Name: Erin [name] MRN: [id number] DOB: May 15, 1956 Today's Date: 11/15/2024 Time: 8458-8396 SLP Time Calculation (min) (ACUTE ONLY): 22 min  Assessment / Plan / Recommendation Clinical Impression  Pt on caseload and ST requested to see for possible texture upgrade as pt verbalized she would like to have more than mostly liquids on current full liquid diet. Husband states lower denture plate at home and did not always wear it depending on what she was eating. He plans to bring lower plate to hospital. Observed with Dys 2 simulated texture and regular with somewhat rapid pace with cueing to ensure she fully masticated although no observable difficulty swallowing and clear oral cavity. Pt coughed with first of two straw sips thin until straw removed by SLP per recommendation liquids via cup, no straws. Husband denies coughing with cup sip liquids. SLP upgraded to Dys 3, continue thin, swallow twice after solids. ST will follow with current recommended diet and upgrade as pt able and pt followed for cognition as well.    HPI HPI: 68 yo F adm to [facility] 12/3 for ventral hernia repair. 12/4 ex lap with abd hemorrhage, remained intubated post op. 12/5 abdominal closure. 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 12/9 transfer to [facility]. 12/11 extubated. BSE 11/01/24: NPO, ice chips after oral care. MBS 12/17: silent aspiration, DNFC; NPO. Significant issues with attn/cog. SLP following for cognitive-linguistic deficits and dysphagia. PMHx: anal CA, hypothyroidism, GERD, CAD, cardiomyopathy, PVD, HLD, HTN, RLS. Per 2023 MBS, mild pharyngeal dysphagia and suspected primary esophageal dysphagia. MBSS completed on 11/11/24 showed mild oropharygneal dysphagia with recommendations of a full liquid/thin liquid diet.      SLP Plan  Continue with current plan of care        Swallow Evaluation Recommendations   Recommendations: PO diet PO Diet  Recommendation: Dysphagia 3 (Mechanical soft);Thin liquids (Level 0) Liquid Administration via: Cup;No straw Medication Administration: Via alternative means Supervision: Patient able to self-feed;Full supervision/cueing for swallowing strategies Postural changes: Position pt fully upright for meals Oral care recommendations: Oral care BID (2x/day)     Recommendations                     Oral care BID   Frequent or constant Supervision/Assistance Dysphagia, unspecified (R13.10)     Continue with current plan of care     Dustin Olam Bull  11/15/2024, 4:20 PM

## 2024-11-15 NOTE — Progress Notes (Signed)
 Patient has been observed sipping on drinks so far this shift since around 0930- patient was also observed working with PT and ambulated from bed to chair with stand-by assistance- reached out to dietician and provider inquiring about run time of nightly tube feed- if could be shortened to increase appetite

## 2024-11-15 NOTE — TOC Progression Note (Signed)
 Transition of Care [facility]) - Progression Note    Patient Details  Name: Erin [name] MRN: [id number] Date of Birth: 1956-10-02  [facility] CM/SW Contact  Landry DELENA Senters, RN Phone Number: 11/15/2024, 4:19 PM  Clinical Narrative:     CM spoke with patient's husband today regarding taking patient home vs SNF at d/c. Husband is interested in SNF, CM informed him we will fax info out after PEG is placed and Cortrak can be removed.   Potential plan for PEG placement next Tuesday 12/30.   CM will continue to follow.   Expected Discharge Plan: Home w Home Health Services Barriers to Discharge: Continued Medical Work up               Expected Discharge Plan and Services   Discharge Planning Services: CM Consult Post Acute Care Choice: Long Term Acute Care (LTAC) Living arrangements for the past 2 months: Single Family Home Expected Discharge Date: 10/23/24               DME Arranged: N/A DME Agency: NA       HH Arranged: NA HH Agency: NA         Social Drivers of Health (SDOH) Interventions SDOH Screenings   Food Insecurity: No Food Insecurity (10/24/2024)  Housing: Low Risk (10/24/2024)  Transportation Needs: No Transportation Needs (10/24/2024)  Utilities: Not At Risk (10/24/2024)  Alcohol Screen: Low Risk (02/26/2024)  Depression (PHQ2-9): Low Risk (02/26/2024)  Financial Resource Strain: Low Risk (02/26/2024)  Physical Activity: Inactive (02/26/2024)  Social Connections: Socially Integrated (10/24/2024)  Stress: No Stress Concern Present (02/26/2024)  Tobacco Use: Medium Risk (10/23/2024)  Health Literacy: Adequate Health Literacy (02/26/2024)    Readmission Risk Interventions     No data to display

## 2024-11-16 ENCOUNTER — Other Ambulatory Visit (HOSPITAL_COMMUNITY): Payer: Self-pay

## 2024-11-16 DIAGNOSIS — Z9889 Other specified postprocedural states: Secondary | ICD-10-CM | POA: Diagnosis not present

## 2024-11-16 DIAGNOSIS — Z8719 Personal history of other diseases of the digestive system: Secondary | ICD-10-CM | POA: Diagnosis not present

## 2024-11-16 LAB — BASIC METABOLIC PANEL WITH GFR
Anion gap: 12 (ref 5–15)
BUN: 57 mg/dL — ABNORMAL HIGH (ref 8–23)
CO2: 23 mmol/L (ref 22–32)
Calcium: 9.4 mg/dL (ref 8.9–10.3)
Chloride: 99 mmol/L (ref 98–111)
Creatinine, Ser: 1.64 mg/dL — ABNORMAL HIGH (ref 0.44–1.00)
GFR, Estimated: 34 mL/min — ABNORMAL LOW
Glucose, Bld: 117 mg/dL — ABNORMAL HIGH (ref 70–99)
Potassium: 4 mmol/L (ref 3.5–5.1)
Sodium: 134 mmol/L — ABNORMAL LOW (ref 135–145)

## 2024-11-16 LAB — GLUCOSE, CAPILLARY
Glucose-Capillary: 101 mg/dL — ABNORMAL HIGH (ref 70–99)
Glucose-Capillary: 113 mg/dL — ABNORMAL HIGH (ref 70–99)
Glucose-Capillary: 113 mg/dL — ABNORMAL HIGH (ref 70–99)
Glucose-Capillary: 116 mg/dL — ABNORMAL HIGH (ref 70–99)
Glucose-Capillary: 117 mg/dL — ABNORMAL HIGH (ref 70–99)
Glucose-Capillary: 99 mg/dL (ref 70–99)

## 2024-11-16 LAB — CBC
HCT: 27.4 % — ABNORMAL LOW (ref 36.0–46.0)
Hemoglobin: 9 g/dL — ABNORMAL LOW (ref 12.0–15.0)
MCH: 31.4 pg (ref 26.0–34.0)
MCHC: 32.8 g/dL (ref 30.0–36.0)
MCV: 95.5 fL (ref 80.0–100.0)
Platelets: 343 K/uL (ref 150–400)
RBC: 2.87 MIL/uL — ABNORMAL LOW (ref 3.87–5.11)
RDW: 15.4 % (ref 11.5–15.5)
WBC: 6.2 K/uL (ref 4.0–10.5)
nRBC: 0 % (ref 0.0–0.2)

## 2024-11-16 NOTE — Plan of Care (Signed)
   Problem: Health Behavior/Discharge Planning: Goal: Ability to manage health-related needs will improve Outcome: Progressing

## 2024-11-16 NOTE — Progress Notes (Signed)
 Brief Nutrition Note  New calorie count noted to be started on pt that has been extensively followed by RD team this admission. Pt currently with a DYS3 diet order in place and RN observed that pt seemed to be more participatory in meals. PEG workup in progress. Kcal count to determine if pt is progressing towards meeting needs or if PEG still needed.   Discussed kcal count with RN who will hang envelope on door and collect meal tickets. Advised tickets would be collected on Monday when RD returns onsite.   For last full assessment note, see RD note from 12/23. Will follow-up on Monday for the results of the kcal count.  Vernell Lukes, RD, LDN, CNSC Registered Dietitian II Please reach out via secure chat

## 2024-11-16 NOTE — Plan of Care (Signed)
 Patient is progressing towards goals of care.     Problem: Education: Goal: Knowledge of General Education information will improve Description: Including pain rating scale, medication(s)/side effects and non-pharmacologic comfort measures Outcome: Progressing   Problem: Health Behavior/Discharge Planning: Goal: Ability to manage health-related needs will improve Outcome: Progressing   Problem: Clinical Measurements: Goal: Ability to maintain clinical measurements within normal limits will improve Outcome: Progressing Goal: Will remain free from infection Outcome: Progressing Goal: Diagnostic test results will improve Outcome: Progressing Goal: Respiratory complications will improve Outcome: Progressing Goal: Cardiovascular complication will be avoided Outcome: Progressing   Problem: Activity: Goal: Risk for activity intolerance will decrease Outcome: Progressing   Problem: Nutrition: Goal: Adequate nutrition will be maintained Outcome: Progressing   Problem: Coping: Goal: Level of anxiety will decrease Outcome: Progressing   Problem: Elimination: Goal: Will not experience complications related to bowel motility Outcome: Progressing Goal: Will not experience complications related to urinary retention Outcome: Progressing   Problem: Pain Managment: Goal: General experience of comfort will improve and/or be controlled Outcome: Progressing   Problem: Safety: Goal: Ability to remain free from injury will improve Outcome: Progressing   Problem: Skin Integrity: Goal: Risk for impaired skin integrity will decrease Outcome: Progressing   Problem: Activity: Goal: Ability to tolerate increased activity will improve Outcome: Progressing   Problem: Respiratory: Goal: Ability to maintain a clear airway and adequate ventilation will improve Outcome: Progressing   Problem: Role Relationship: Goal: Method of communication will improve Outcome: Progressing    Problem: Education: Goal: Knowledge of disease or condition will improve Outcome: Progressing Goal: Knowledge of secondary prevention will improve (MUST DOCUMENT ALL) Outcome: Progressing Goal: Knowledge of patient specific risk factors will improve (DELETE if not current risk factor) Outcome: Progressing   Problem: Ischemic Stroke/TIA Tissue Perfusion: Goal: Complications of ischemic stroke/TIA will be minimized Outcome: Progressing   Problem: Coping: Goal: Will verbalize positive feelings about self Outcome: Progressing Goal: Will identify appropriate support needs Outcome: Progressing   Problem: Health Behavior/Discharge Planning: Goal: Ability to manage health-related needs will improve Outcome: Progressing Goal: Goals will be collaboratively established with patient/family Outcome: Progressing   Problem: Self-Care: Goal: Ability to participate in self-care as condition permits will improve Outcome: Progressing Goal: Verbalization of feelings and concerns over difficulty with self-care will improve Outcome: Progressing Goal: Ability to communicate needs accurately will improve Outcome: Progressing   Problem: Nutrition: Goal: Risk of aspiration will decrease Outcome: Progressing Goal: Dietary intake will improve Outcome: Progressing   Problem: Education: Goal: Knowledge of disease or condition will improve Outcome: Progressing Goal: Knowledge of secondary prevention will improve (MUST DOCUMENT ALL) Outcome: Progressing Goal: Knowledge of patient specific risk factors will improve (DELETE if not current risk factor) Outcome: Progressing   Problem: Ischemic Stroke/TIA Tissue Perfusion: Goal: Complications of ischemic stroke/TIA will be minimized Outcome: Progressing   Problem: Coping: Goal: Will verbalize positive feelings about self Outcome: Progressing Goal: Will identify appropriate support needs Outcome: Progressing   Problem: Health Behavior/Discharge  Planning: Goal: Ability to manage health-related needs will improve Outcome: Progressing Goal: Goals will be collaboratively established with patient/family Outcome: Progressing   Problem: Self-Care: Goal: Ability to participate in self-care as condition permits will improve Outcome: Progressing Goal: Verbalization of feelings and concerns over difficulty with self-care will improve Outcome: Progressing Goal: Ability to communicate needs accurately will improve Outcome: Progressing   Problem: Nutrition: Goal: Risk of aspiration will decrease Outcome: Progressing Goal: Dietary intake will improve Outcome: Progressing   Problem: Education: Goal: Knowledge of disease or condition  will improve Outcome: Progressing Goal: Knowledge of secondary prevention will improve (MUST DOCUMENT ALL) Outcome: Progressing Goal: Knowledge of patient specific risk factors will improve (DELETE if not current risk factor) Outcome: Progressing   Problem: Ischemic Stroke/TIA Tissue Perfusion: Goal: Complications of ischemic stroke/TIA will be minimized Outcome: Progressing

## 2024-11-16 NOTE — Plan of Care (Signed)
 " Problem: Education: Goal: Knowledge of General Education information will improve Description: Including pain rating scale, medication(s)/side effects and non-pharmacologic comfort measures Outcome: Progressing   Problem: Health Behavior/Discharge Planning: Goal: Ability to manage health-related needs will improve Outcome: Progressing   Problem: Clinical Measurements: Goal: Ability to maintain clinical measurements within normal limits will improve Outcome: Progressing Goal: Will remain free from infection Outcome: Progressing Goal: Diagnostic test results will improve Outcome: Progressing Goal: Respiratory complications will improve Outcome: Progressing Goal: Cardiovascular complication will be avoided Outcome: Progressing   Problem: Activity: Goal: Risk for activity intolerance will decrease Outcome: Progressing   Problem: Nutrition: Goal: Adequate nutrition will be maintained Outcome: Progressing   Problem: Coping: Goal: Level of anxiety will decrease Outcome: Progressing   Problem: Elimination: Goal: Will not experience complications related to bowel motility Outcome: Progressing Goal: Will not experience complications related to urinary retention Outcome: Progressing   Problem: Pain Managment: Goal: General experience of comfort will improve and/or be controlled Outcome: Progressing   Problem: Safety: Goal: Ability to remain free from injury will improve Outcome: Progressing   Problem: Skin Integrity: Goal: Risk for impaired skin integrity will decrease Outcome: Progressing   Problem: Activity: Goal: Ability to tolerate increased activity will improve Outcome: Progressing   Problem: Respiratory: Goal: Ability to maintain a clear airway and adequate ventilation will improve Outcome: Progressing   Problem: Role Relationship: Goal: Method of communication will improve Outcome: Progressing   Problem: Education: Goal: Knowledge of disease or condition  will improve Outcome: Progressing Goal: Knowledge of secondary prevention will improve (MUST DOCUMENT ALL) Outcome: Progressing Goal: Knowledge of patient specific risk factors will improve (DELETE if not current risk factor) Outcome: Progressing   Problem: Ischemic Stroke/TIA Tissue Perfusion: Goal: Complications of ischemic stroke/TIA will be minimized Outcome: Progressing   Problem: Coping: Goal: Will verbalize positive feelings about self Outcome: Progressing Goal: Will identify appropriate support needs Outcome: Progressing   Problem: Health Behavior/Discharge Planning: Goal: Ability to manage health-related needs will improve Outcome: Progressing Goal: Goals will be collaboratively established with patient/family Outcome: Progressing   Problem: Self-Care: Goal: Ability to participate in self-care as condition permits will improve Outcome: Progressing Goal: Verbalization of feelings and concerns over difficulty with self-care will improve Outcome: Progressing Goal: Ability to communicate needs accurately will improve Outcome: Progressing   Problem: Nutrition: Goal: Risk of aspiration will decrease Outcome: Progressing Goal: Dietary intake will improve Outcome: Progressing   Problem: Education: Goal: Knowledge of disease or condition will improve Outcome: Progressing Goal: Knowledge of secondary prevention will improve (MUST DOCUMENT ALL) Outcome: Progressing Goal: Knowledge of patient specific risk factors will improve (DELETE if not current risk factor) Outcome: Progressing   Problem: Ischemic Stroke/TIA Tissue Perfusion: Goal: Complications of ischemic stroke/TIA will be minimized Outcome: Progressing   Problem: Coping: Goal: Will verbalize positive feelings about self Outcome: Progressing Goal: Will identify appropriate support needs Outcome: Progressing   Problem: Health Behavior/Discharge Planning: Goal: Ability to manage health-related needs will  improve Outcome: Progressing Goal: Goals will be collaboratively established with patient/family Outcome: Progressing   Problem: Self-Care: Goal: Ability to participate in self-care as condition permits will improve Outcome: Progressing Goal: Verbalization of feelings and concerns over difficulty with self-care will improve Outcome: Progressing Goal: Ability to communicate needs accurately will improve Outcome: Progressing   Problem: Nutrition: Goal: Risk of aspiration will decrease Outcome: Progressing Goal: Dietary intake will improve Outcome: Progressing   Problem: Education: Goal: Knowledge of disease or condition will improve Outcome: Progressing Goal: Knowledge of secondary prevention will  improve (MUST DOCUMENT ALL) Outcome: Progressing Goal: Knowledge of patient specific risk factors will improve (DELETE if not current risk factor) Outcome: Progressing   "

## 2024-11-16 NOTE — Progress Notes (Signed)
 Physical Therapy Treatment Patient Details Name: Erin [name] MRN: [id number] DOB: July 30, 1956 Today's Date: 11/16/2024   History of Present Illness 68 y.o. F adm to [facility] 12/3 for ventral hernia repair. 12/4 ex lap with abdominal hemorrhage and remained intubated post op. 12/5 abdominal closure. 12/8 MRI with multiple embolic infarcts. 12/9 transfer to [facility]. 12/11 extubated. PMHx: anal CA, hypothyroidism, GERD, CAD, cardiomyopathy, PVD, HLD, HTN, RLS.    PT Comments  Pt is tolerating longer bouts of activity though continues to be very fatigued by end of session. Pt requires min-modA for bed mobility and demonstrates improved trunk control while seated EOB. Postural control improved to slight trunk sway that improves with light cuing. Increased difficulty with trunk control noted with onset of fatigue. Pt ambulates 5' to sink hand held assist x2 due to balance deficits and weakness. Pt requesting seated rest break at sink while brushing teeth. Pt requires increased assist to return to bed due to fatigue. Pt would benefit from continued PT services focused on transfers, balance, strength, and progressing gait to promote improved activity tolerance.    If plan is discharge home, recommend the following: A little help with walking and/or transfers;A lot of help with bathing/dressing/bathroom;Assistance with cooking/housework;Assistance with feeding;Direct supervision/assist for medications management;Direct supervision/assist for financial management;Assist for transportation;Help with stairs or ramp for entrance;Supervision due to cognitive status   Can travel by private vehicle     No  Equipment Recommendations  Rolling walker (2 wheels);BSC/3in1;Wheelchair (measurements PT);Hospital bed;Wheelchair cushion (measurements PT)    Recommendations for Other Services       Precautions / Restrictions Precautions Precautions: Fall Recall of Precautions/Restrictions: Impaired Restrictions Weight  Bearing Restrictions Per Provider Order: No     Mobility  Bed Mobility Overal bed mobility: Needs Assistance Bed Mobility: Supine to Sit, Sit to Supine     Supine to sit: Mod assist Sit to supine: Min assist   General bed mobility comments: Trunk and LE management required to sit EOB, pt able to lift her legs into bed upon returning to supine position at end of session. MaxA to boost.    Transfers Overall transfer level: Needs assistance Equipment used: 1 person hand held assist Transfers: Sit to/from Stand Sit to Stand: Min assist           General transfer comment: Assist for balance in standing, pt initially leaning posteriorly in static stance, improves upon marching in place. Hand held assist used for additional support due to weakness.    Ambulation/Gait Ambulation/Gait assistance: Mod assist Gait Distance (Feet): 5 Feet (5'x2) Assistive device: 2 person hand held assist Gait Pattern/deviations: Decreased step length - right, Decreased step length - left, Step-through pattern, Narrow base of support, Trunk flexed, Staggering left, Staggering right, Shuffle   Gait velocity interpretation: <1.31 ft/sec, indicative of household ambulator   General Gait Details: Pt takes short, shuffled steps to sink, benefits from [facility] for balance. Pt fatigues quickly with short distance ambulation. Chair placed at sink for pt to rest while brushing her teeth. Significant forward shoulder posture throughout.   Stairs             Wheelchair Mobility     Tilt Bed    Modified Rankin (Stroke Patients Only)       Balance Overall balance assessment: Needs assistance Sitting-balance support: Bilateral upper extremity supported Sitting balance-Leahy Scale: Fair Sitting balance - Comments: Pt with improving trunk control while sitting EOB, continued postural sway that occasionally requires light tactile assist to correct. Postural sway  increases with onset of fatigue.    Standing balance support: Bilateral upper extremity supported Standing balance-Leahy Scale: Fair Standing balance comment: Pt unsteady due to LE weakness and fatigue. Tolerates short bouts of standing with B HHA.                            Communication Communication Communication: Impaired Factors Affecting Communication: Reduced clarity of speech  Cognition Arousal: Alert Behavior During Therapy: WFL for tasks assessed/performed   PT - Cognitive impairments: Awareness, Safety/Judgement, Problem solving, Sequencing, Attention                       PT - Cognition Comments: Pt's responding more appropriately to questions, increased intelegible verbalizations. Following commands: Impaired Following commands impaired: Follows multi-step commands inconsistently    Cueing Cueing Techniques: Verbal cues, Tactile cues, Visual cues  Exercises      General Comments General comments (skin integrity, edema, etc.): VSS throughout. No new skin abnormalities noted.      Pertinent Vitals/Pain Pain Assessment Faces Pain Scale: Hurts even more Pain Location: Low Back Pain Descriptors / Indicators: Grimacing, Aching, Sore, Sharp, Moaning Pain Intervention(s): Limited activity within patient's tolerance, Repositioned, Monitored during session    Home Living                          Prior Function            PT Goals (current goals can now be found in the care plan section) Acute Rehab PT Goals Patient Stated Goal: No additional goals stated during session. Progress towards PT goals: Progressing toward goals    Frequency    Min 3X/week      PT Plan      Co-evaluation              AM-PAC PT 6 Clicks Mobility   Outcome Measure  Help needed turning from your back to your side while in a flat bed without using bedrails?: None Help needed moving from lying on your back to sitting on the side of a flat bed without using bedrails?: A  Little Help needed moving to and from a bed to a chair (including a wheelchair)?: A Lot Help needed standing up from a chair using your arms (e.g., wheelchair or bedside chair)?: A Little Help needed to walk in hospital room?: A Lot Help needed climbing 3-5 steps with a railing? : Total 6 Click Score: 15    End of Session Equipment Utilized During Treatment: Gait belt Activity Tolerance: Patient limited by fatigue Patient left: in bed;with call bell/phone within reach;with bed alarm set;with family/visitor present Nurse Communication: Mobility status PT Visit Diagnosis: Unsteadiness on feet (R26.81);Muscle weakness (generalized) (M62.81)     Time: 8559-8494 PT Time Calculation (min) (ACUTE ONLY): 25 min  Charges:    $Gait Training: 8-22 mins $Therapeutic Activity: 8-22 mins PT General Charges $$ ACUTE PT VISIT: 1 Visit                     Sabra Morel, PT, DPT  Acute Rehabilitation Services         Office: [phone]      Sabra MARLA Morel 11/16/2024, 3:49 PM

## 2024-11-16 NOTE — Progress Notes (Signed)
 " PROGRESS NOTE    Erin [name]  FMW:[id number] DOB: 06-28-1956 DOA: 10/23/2024 PCP: [name] [name], MD   Brief Narrative:  68 year old woman who presented to [facility] 12/3 for elective hernia repair. 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  Patient admitted initially for postop hernia repair found to have hemoperitoneum with hemorrhagic shock postoperatively requiring intubation and pressors.  Her course was subsequently complicated by bilateral strokes thought to be provoked by hypotension.  At this time patient is stabilizing, she has been extubated, continues tube feeds via NG and is otherwise stable from a surgical standpoint.  Transition out of the ICU to hospitalist team on 12/15 with plan for ongoing care with ultimate plan for disposition pending patient's clinical course.  Hospital course 12/3 admitted to ICU postop 12/3-post hernia repair 12/4 taken back to the OR for hemoperitoneum-active bleed was not identified, abdomen packed with wound VAC in place 12/5 back to the OR for an open abdomen-no evidence of ongoing bleeding and abdomen was closed 12/8 - CT Head with interval development (since 04/2024) of multiple cortical and subcortical hypodensities in bilateral cerebellar and cerebral hemispheres suggestive of acute/subacute infarcts. MRI Brain with numerous areas of restricted diffusion throughout the cerebral hemispheres/cerebellum c/f acute infarcts of embolic etiology, associated mild edema (R occipital lobe, L temporal lobe).  12/11 tolerating PSV 5/5 >> extubated 12/15 transition to TRH team - goals of care discussion with husband(see below) 12/17 General surgery evaluating for PEG -pending SLP re-evaluation in hopes she will be able to  improve -LTAC was denied by insurance  Assessment & Plan:    Hemorrhagic shock from hypovolemia caused during ex lap hernia repair SHOCK - required stay in ICU, pressors, received massive blood transfusion protocol with 5 units of packed RBC and 2 units of cryoprecipitate around 10/23/2024, unfortunately incurred anoxic brain injury/CVA, brief support with intubation.  Shock has resolved, monitor with supportive care.  Acute bilateral cerebral and cerebellar infarcts, causing encephalopathy, aphasia, dysphagia - CT head with multiple cortical and subcortical hypodensities, confirmed by MRI, likely due to profound hypotension from #1 above, seen by neuro, patient remains minimally verbal, has dysphagia requiring NG tube feeds for 3 weeks.  She was being prepped for PEG tube placement however showed considerable improvement in her swallowing on 11/11/2024, seen by speech and started on oral diet, but overall her oral intake remains poor, so even if she improves regarding dysphagia but she will always have an issue of enough calories/nutrition intake, so she will benefit from PEG as I have discussed with husband, so IR has been reengaged to evaluate for PEG.  Aspirin  need to be on hold for 5 days, so plan for PEG is for next Tuesday - Diet was advanced, currently on dysphagia 3, thin liquid, decreased her tube feeding times to 12 hours/days to see if her appetite improves during the day.    HTN  - Continue Amlodipine , Carvedilol , added hydralazine  for better blood pressure control, continue as needed IV hydralazine .  Goal blood pressure is labile but more controlled than not so I will hold on increasing on her scheduled meds.    S/p hernia repair, elective 12/3, S/p ex-lap 12/4, 12/5  - Postoperative management per [facility],  Wound care per surgery   Acute kidney injury to ATN from shock.  History of left renal artery stenosis.  Renal function improving.  Monitor   Acute respiratory insufficiency ,  extubated 12/11 - happened due to anoxic brain injury/CVA.  Resolved.  HCAP - Enterobacter 12/6  - Completed antibiotic treatment.  History of peripheral neuropathy and itching.  Usually takes high doses of Neurontin  at home, cut down as she has some encephalopathy on 11/09/2024, reduce sedative medications  Mild metabolic encephalopathy.  See above.  Reduce sedating medications including Neurontin .   DVT prophylaxis: heparin  injection 5,000 Units Start: 10/28/24 2200 SCD's Start: 10/23/24 1918 Code Status:   Code Status: Full Code Family Communication: Husband at bedside   Status is: Inpt  Dispo: Home with home health per patient's husband  Consultants:  PCCM, General surgery, Neuro, IR  Procedures:  Hernia repair 12/3 and ex-lap 12/5   Antimicrobials:  None(completed prior course)   Subjective:   No BM yesterday, per husband she had a good dinner yesterday.  Objective: Vitals:   11/16/24 0400 11/16/24 0500 11/16/24 0817 11/16/24 1214  BP: (!) 140/61  136/62 130/62  Pulse:   98 77  Resp: 13  16 11   Temp: 97.7 F (36.5 C)  98.1 F (36.7 C) 98.3 F (36.8 C)  TempSrc: Oral  Oral Oral  SpO2:      Weight:  46.3 kg    Height:        Intake/Output Summary (Last 24 hours) at 11/16/2024 1507 Last data filed at 11/16/2024 1130 Gross per 24 hour  Intake 950 ml  Output 1100 ml  Net -150 ml   Filed Weights   11/14/24 0801 11/15/24 0424 11/16/24 0500  Weight: 43.7 kg 46.3 kg 46.3 kg    Examination:   More awake, interactive and communicative today. Good air entry bilaterally RRR Abd soft, abdominal surgical wound bandaged, C/D/I No Cyanosis, Clubbing or edema, No new Rash or bruise       Data Review:   Patient Lines/Drains/Airways Status     Active Line/Drains/Airways     Name Placement date Placement time Site Days   Peripheral IV 11/15/24 20 G Anterior;Left Forearm 11/15/24  0315  Forearm  1   External Urinary Catheter 11/01/24  1900  --  15   Small  Bore Feeding Tube Left nare Marking at nare/corner of mouth 68 cm 11/01/24  1536  Left nare  15   Wound 10/23/24 0923 Surgical Closed Surgical Incision Abdomen 10/23/24  0923  Abdomen  24   Wound 10/23/24 9076 Surgical Laparoscopic Abdomen 1: Upper;Left 2: Left;Lower 10/23/24  0923  Abdomen  24   Wound 11/01/24 1600 Irritant Contact Dermatitis Buttocks 11/01/24  1600  Buttocks  15             Inpatient Medications  Scheduled Meds:  amLODipine   10 mg Per Tube Daily   carvedilol   12.5 mg Per Tube BID WC   Chlorhexidine  Gluconate Cloth  6 each Topical Daily   feeding supplement (KATE FARMS STANDARD 1.4) Liquid  960 mL Per Tube Q24H   feeding supplement (PROSource TF20)  60 mL Per Tube Daily   free water   200 mL Per Tube Q6H   gabapentin   100 mg Per Tube Q12H   Gerhardt's butt cream   Topical BID   heparin  injection (subcutaneous)  5,000 Units Subcutaneous Q8H   hydrALAZINE   50 mg Per Tube Q8H   Influenza vac split trivalent PF  0.5 mL Intramuscular Tomorrow-1000   levothyroxine   75 mcg Per Tube Q0600   methylphenidate   5 mg Oral q morning   mouth rinse  15 mL Mouth Rinse 4 times per day   pantoprazole  (PROTONIX ) IV  40 mg Intravenous QHS   Continuous Infusions:   PRN Meds:.acetaminophen , hydrALAZINE , labetalol , loperamide , ondansetron  **OR** ondansetron  (ZOFRAN ) IV, mouth rinse, traMADol   DVT Prophylaxis  heparin  injection 5,000 Units Start: 10/28/24 2200 SCD's Start: 10/23/24 1918   Recent Labs  Lab 11/10/24 0408 11/11/24 0334 11/12/24 0327 11/13/24 0155 11/14/24 1151 11/15/24 0525 11/16/24 0539  WBC 6.7 6.7 6.8 8.2 9.0 6.6 6.2  HGB 8.4* 8.4* 8.5* 9.1* 9.5* 9.1* 9.0*  HCT 26.1* 26.2* 25.9* 28.0* 28.1* 27.1* 27.4*  PLT 558* 536* 510* 469* 450* 433* 343  MCV 96.3 96.0 96.3 96.9 94.0 94.1 95.5  MCH 31.0 30.8 31.6 31.5 31.8 31.6 31.4  MCHC 32.2 32.1 32.8 32.5 33.8 33.6 32.8  RDW 16.9* 16.4* 16.0* 16.0* 15.6* 15.7* 15.4  LYMPHSABS 1.0 0.9 1.1 1.2  --   --   --    MONOABS 0.9 0.9 1.0 1.1*  --   --   --   EOSABS 0.1 0.1 0.1 0.1  --   --   --   BASOSABS 0.0 0.0 0.0 0.0  --   --   --     Recent Labs  Lab 11/10/24 0408 11/11/24 0334 11/12/24 0327 11/13/24 0155 11/15/24 0525 11/16/24 0539  NA 136 136 135 136 134* 134*  K 4.9 4.5 4.7 4.6 4.0 4.0  CL 104 102 102 101 98 99  CO2 21* 22 23 24 24 23   ANIONGAP 11 13 10 11 11 12   GLUCOSE 105* 104* 103* 108* 114* 117*  BUN 47* 50* 51* 56* 56* 57*  CREATININE 1.60* 1.56* 1.48* 1.50* 1.57* 1.64*  AST 17 18 16 22   --   --   ALT 10 12 10 13   --   --   ALKPHOS 83 83 88 91  --   --   BILITOT <0.2 0.2 0.3 0.2  --   --   ALBUMIN  3.2* 3.4* 3.5 3.6  --   --   INR  --   --   --   --  1.0  --   MG 2.1 2.2  --  2.3  --   --   PHOS 5.0* 4.4  --  4.5  --   --   CALCIUM  9.0 9.2 9.2 9.6 9.2 9.4      Recent Labs  Lab 11/10/24 0408 11/11/24 0334 11/12/24 0327 11/13/24 0155 11/15/24 0525 11/16/24 0539  INR  --   --   --   --  1.0  --   MG 2.1 2.2  --  2.3  --   --   CALCIUM  9.0 9.2 9.2 9.6 9.2 9.4    --------------------------------------------------------------------------------------------------------------- Lab Results  Component Value Date   CHOL 91 10/29/2024   HDL 12 (L) 10/29/2024   LDLCALC 13 10/29/2024   LDLDIRECT 70.0 04/07/2021   TRIG 331 (H) 10/29/2024   CHOLHDL 7.6 10/29/2024    Lab Results  Component Value Date   HGBA1C 4.9 10/18/2024   No results for input(s): TSH, T4TOTAL, FREET4, T3FREE, THYROIDAB in the last 72 hours. No results for input(s): VITAMINB12, FOLATE, FERRITIN, TIBC, IRON, RETICCTPCT in the last 72 hours. ------------------------------------------------------------------------------------------------------------------ Cardiac Enzymes No results for input(s): CKMB, TROPONINI, MYOGLOBIN in the last 168 hours.  Invalid input(s): CK  Micro Results No results found for this or any previous visit (from the past 240  hours).   Radiology  Reports  No results found.       Signature  -   Brayton Lye M.D on 11/16/2024 at 3:07 PM   -  To page go to [email or web address]     "

## 2024-11-17 DIAGNOSIS — Z9889 Other specified postprocedural states: Secondary | ICD-10-CM | POA: Diagnosis not present

## 2024-11-17 DIAGNOSIS — Z8719 Personal history of other diseases of the digestive system: Secondary | ICD-10-CM | POA: Diagnosis not present

## 2024-11-17 LAB — CBC
HCT: 27 % — ABNORMAL LOW (ref 36.0–46.0)
Hemoglobin: 8.8 g/dL — ABNORMAL LOW (ref 12.0–15.0)
MCH: 31 pg (ref 26.0–34.0)
MCHC: 32.6 g/dL (ref 30.0–36.0)
MCV: 95.1 fL (ref 80.0–100.0)
Platelets: 337 K/uL (ref 150–400)
RBC: 2.84 MIL/uL — ABNORMAL LOW (ref 3.87–5.11)
RDW: 15.2 % (ref 11.5–15.5)
WBC: 6.3 K/uL (ref 4.0–10.5)
nRBC: 0 % (ref 0.0–0.2)

## 2024-11-17 LAB — GLUCOSE, CAPILLARY
Glucose-Capillary: 107 mg/dL — ABNORMAL HIGH (ref 70–99)
Glucose-Capillary: 113 mg/dL — ABNORMAL HIGH (ref 70–99)
Glucose-Capillary: 117 mg/dL — ABNORMAL HIGH (ref 70–99)
Glucose-Capillary: 119 mg/dL — ABNORMAL HIGH (ref 70–99)
Glucose-Capillary: 125 mg/dL — ABNORMAL HIGH (ref 70–99)
Glucose-Capillary: 97 mg/dL (ref 70–99)

## 2024-11-17 LAB — BASIC METABOLIC PANEL WITH GFR
Anion gap: 11 (ref 5–15)
BUN: 55 mg/dL — ABNORMAL HIGH (ref 8–23)
CO2: 24 mmol/L (ref 22–32)
Calcium: 9.5 mg/dL (ref 8.9–10.3)
Chloride: 101 mmol/L (ref 98–111)
Creatinine, Ser: 1.49 mg/dL — ABNORMAL HIGH (ref 0.44–1.00)
GFR, Estimated: 38 mL/min — ABNORMAL LOW
Glucose, Bld: 104 mg/dL — ABNORMAL HIGH (ref 70–99)
Potassium: 4.3 mmol/L (ref 3.5–5.1)
Sodium: 136 mmol/L (ref 135–145)

## 2024-11-17 MED ORDER — HYDROCORTISONE 1 % EX CREA
TOPICAL_CREAM | Freq: Three times a day (TID) | CUTANEOUS | Status: DC | PRN
  Filled 2024-11-17: qty 28

## 2024-11-17 NOTE — Progress Notes (Signed)
 " PROGRESS NOTE    Erin [name]  FMW:[id number] DOB: Aug 14, 1956 DOA: 10/23/2024 PCP: [name] [name], MD   Brief Narrative:  68 year old woman who presented to [facility] 12/3 for elective hernia repair. 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  Patient admitted initially for postop hernia repair found to have hemoperitoneum with hemorrhagic shock postoperatively requiring intubation and pressors.  Her course was subsequently complicated by bilateral strokes thought to be provoked by hypotension.  At this time patient is stabilizing, she has been extubated, continues tube feeds via NG and is otherwise stable from a surgical standpoint.  Transition out of the ICU to hospitalist team on 12/15 with plan for ongoing care with ultimate plan for disposition pending patient's clinical course.  Hospital course 12/3 admitted to ICU postop 12/3-post hernia repair 12/4 taken back to the OR for hemoperitoneum-active bleed was not identified, abdomen packed with wound VAC in place 12/5 back to the OR for an open abdomen-no evidence of ongoing bleeding and abdomen was closed 12/8 - CT Head with interval development (since 04/2024) of multiple cortical and subcortical hypodensities in bilateral cerebellar and cerebral hemispheres suggestive of acute/subacute infarcts. MRI Brain with numerous areas of restricted diffusion throughout the cerebral hemispheres/cerebellum c/f acute infarcts of embolic etiology, associated mild edema (R occipital lobe, L temporal lobe).  12/11 tolerating PSV 5/5 >> extubated 12/15 transition to TRH team - goals of care discussion with husband(see below) 12/17 General surgery evaluating for PEG -pending SLP re-evaluation in hopes she will be able to  improve -LTAC was denied by insurance  Assessment & Plan:    Hemorrhagic shock from hypovolemia caused during ex lap hernia repair SHOCK - required stay in ICU, pressors, received massive blood transfusion protocol with 5 units of packed RBC and 2 units of cryoprecipitate around 10/23/2024, unfortunately incurred anoxic brain injury/CVA, brief support with intubation.  Shock has resolved, monitor with supportive care.  Acute bilateral cerebral and cerebellar infarcts, causing encephalopathy, aphasia, dysphagia - CT head with multiple cortical and subcortical hypodensities, confirmed by MRI, likely due to profound hypotension from #1 above, seen by neuro, patient remains minimally verbal, has dysphagia requiring NG tube feeds for 3 weeks.  She was being prepped for PEG tube placement however showed considerable improvement in her swallowing on 11/11/2024, seen by speech and started on oral diet, but overall her oral intake remains poor, so even if she improves regarding dysphagia but she will always have an issue of enough calories/nutrition intake, so she will benefit from PEG as I have discussed with husband, so IR has been reengaged to evaluate for PEG.  Aspirin  need to be on hold for 5 days, so plan for PEG is for next Tuesday - Diet was advanced, currently on dysphagia 3, thin liquid, decreased her tube feeding times to 12 hours/days to see if her appetite improves during the day.    HTN  - Continue Amlodipine , Carvedilol , added hydralazine  for better blood pressure control, continue as needed IV hydralazine .  Goal blood pressure is labile but more controlled than not so I will hold on increasing on her scheduled meds.    S/p hernia repair, elective 12/3, S/p ex-lap 12/4, 12/5  - Postoperative management per CCS,  Wound care per surgery   Acute kidney injury to ATN from shock.  History of left renal artery stenosis.  Renal function improving.  Monitor   Acute respiratory insufficiency ,  extubated 12/11 - happened due to anoxic brain injury/CVA.  Resolved.  HCAP - Enterobacter 12/6  - Completed antibiotic treatment.  History of peripheral neuropathy and itching.  Usually takes high doses of Neurontin  at home, cut down as she has some encephalopathy on 11/09/2024, reduce sedative medications  Mild metabolic encephalopathy.  See above.  Reduce sedating medications including Neurontin .   DVT prophylaxis: heparin  injection 5,000 Units Start: 10/28/24 2200 SCD's Start: 10/23/24 1918 Code Status:   Code Status: Full Code Family Communication: Husband at bedside   Status is: Inpt  Dispo: With home health versus SNF when PEG tube is inserted.  Consultants:  PCCM, General surgery, Neuro, IR  Procedures:  Hernia repair 12/3 and ex-lap 12/5   Antimicrobials:  None(completed prior course)   Subjective:   Patient and husband are unhappy as she had bowel movement overnight and she was not cleaned in a timely manner, her over oral intake remains significantly low  Objective: Vitals:   11/17/24 0400 11/17/24 0500 11/17/24 0810 11/17/24 1200  BP: (!) 134/54  (!) 149/63 126/62  Pulse: 82  89 82  Resp: 13  15 14   Temp: 98.2 F (36.8 C)  97.7 F (36.5 C) 98.4 F (36.9 C)  TempSrc: Axillary  Oral Oral  SpO2: 96%  100% 97%  Weight:  45.7 kg    Height:        Intake/Output Summary (Last 24 hours) at 11/17/2024 1433 Last data filed at 11/17/2024 1300 Gross per 24 hour  Intake 2118 ml  Output 1950 ml  Net 168 ml   Filed Weights   11/15/24 0424 11/16/24 0500 11/17/24 0500  Weight: 46.3 kg 46.3 kg 45.7 kg    Examination:  Awake, alert, appropriate and communicative, follows commands Good air entry bilaterally RRR Abd soft, abdominal surgical wound bandaged, C/D/I No Cyanosis, Clubbing or edema, No new Rash or bruise       Data Review:   Patient Lines/Drains/Airways Status     Active Line/Drains/Airways     Name Placement date Placement time Site Days    External Urinary Catheter 11/01/24  1900  --  16   Small Bore Feeding Tube Left nare Marking at nare/corner of mouth 68 cm 11/01/24  1536  Left nare  16   Wound 10/23/24 0923 Surgical Closed Surgical Incision Abdomen 10/23/24  0923  Abdomen  25   Wound 10/23/24 9076 Surgical Laparoscopic Abdomen 1: Upper;Left 2: Left;Lower 10/23/24  0923  Abdomen  25   Wound 11/01/24 1600 Irritant Contact Dermatitis Buttocks 11/01/24  1600  Buttocks  16             Inpatient Medications  Scheduled Meds:  amLODipine   10 mg Per Tube Daily   carvedilol   12.5 mg Per Tube BID WC   Chlorhexidine  Gluconate Cloth  6 each Topical Daily   feeding supplement (KATE FARMS STANDARD 1.4) Liquid  960 mL Per Tube Q24H   feeding supplement (PROSource TF20)  60 mL Per Tube Daily   free water   200 mL Per Tube Q6H   gabapentin   100 mg Per Tube Q12H   Gerhardt's butt cream   Topical BID   heparin  injection (subcutaneous)  5,000 Units Subcutaneous Q8H   hydrALAZINE   50 mg Per Tube Q8H   Influenza vac split trivalent PF  0.5 mL Intramuscular Tomorrow-1000   levothyroxine   75 mcg Per Tube Q0600  methylphenidate   5 mg Oral q morning   mouth rinse  15 mL Mouth Rinse 4 times per day   pantoprazole  (PROTONIX ) IV  40 mg Intravenous QHS   Continuous Infusions:   PRN Meds:.acetaminophen , hydrALAZINE , hydrocortisone  cream, labetalol , loperamide , ondansetron  **OR** ondansetron  (ZOFRAN ) IV, mouth rinse, traMADol   DVT Prophylaxis  heparin  injection 5,000 Units Start: 10/28/24 2200 SCD's Start: 10/23/24 1918   Recent Labs  Lab 11/11/24 0334 11/12/24 0327 11/13/24 0155 11/14/24 1151 11/15/24 0525 11/16/24 0539 11/17/24 0550  WBC 6.7 6.8 8.2 9.0 6.6 6.2 6.3  HGB 8.4* 8.5* 9.1* 9.5* 9.1* 9.0* 8.8*  HCT 26.2* 25.9* 28.0* 28.1* 27.1* 27.4* 27.0*  PLT 536* 510* 469* 450* 433* 343 337  MCV 96.0 96.3 96.9 94.0 94.1 95.5 95.1  MCH 30.8 31.6 31.5 31.8 31.6 31.4 31.0  MCHC 32.1 32.8 32.5 33.8 33.6 32.8 32.6  RDW 16.4*  16.0* 16.0* 15.6* 15.7* 15.4 15.2  LYMPHSABS 0.9 1.1 1.2  --   --   --   --   MONOABS 0.9 1.0 1.1*  --   --   --   --   EOSABS 0.1 0.1 0.1  --   --   --   --   BASOSABS 0.0 0.0 0.0  --   --   --   --     Recent Labs  Lab 11/11/24 0334 11/12/24 0327 11/13/24 0155 11/15/24 0525 11/16/24 0539 11/17/24 0550  NA 136 135 136 134* 134* 136  K 4.5 4.7 4.6 4.0 4.0 4.3  CL 102 102 101 98 99 101  CO2 22 23 24 24 23 24   ANIONGAP 13 10 11 11 12 11   GLUCOSE 104* 103* 108* 114* 117* 104*  BUN 50* 51* 56* 56* 57* 55*  CREATININE 1.56* 1.48* 1.50* 1.57* 1.64* 1.49*  AST 18 16 22   --   --   --   ALT 12 10 13   --   --   --   ALKPHOS 83 88 91  --   --   --   BILITOT 0.2 0.3 0.2  --   --   --   ALBUMIN  3.4* 3.5 3.6  --   --   --   INR  --   --   --  1.0  --   --   MG 2.2  --  2.3  --   --   --   PHOS 4.4  --  4.5  --   --   --   CALCIUM  9.2 9.2 9.6 9.2 9.4 9.5      Recent Labs  Lab 11/11/24 0334 11/12/24 0327 11/13/24 0155 11/15/24 0525 11/16/24 0539 11/17/24 0550  INR  --   --   --  1.0  --   --   MG 2.2  --  2.3  --   --   --   CALCIUM  9.2 9.2 9.6 9.2 9.4 9.5    --------------------------------------------------------------------------------------------------------------- Lab Results  Component Value Date   CHOL 91 10/29/2024   HDL 12 (L) 10/29/2024   LDLCALC 13 10/29/2024   LDLDIRECT 70.0 04/07/2021   TRIG 331 (H) 10/29/2024   CHOLHDL 7.6 10/29/2024    Lab Results  Component Value Date   HGBA1C 4.9 10/18/2024   No results for input(s): TSH, T4TOTAL, FREET4, T3FREE, THYROIDAB in the last 72 hours. No results for input(s): VITAMINB12, FOLATE, FERRITIN, TIBC, IRON, RETICCTPCT in the last 72 hours. ------------------------------------------------------------------------------------------------------------------ Cardiac Enzymes No results for input(s): CKMB, TROPONINI, MYOGLOBIN in the  last 168 hours.  Invalid input(s): CK  Micro  Results No results found for this or any previous visit (from the past 240 hours).   Radiology Reports  No results found.       Signature  -   Brayton Lye M.D on 11/17/2024 at 2:33 PM   -  To page go to [email or web address]     "

## 2024-11-17 NOTE — Plan of Care (Signed)
 " Problem: Education: Goal: Knowledge of General Education information will improve Description: Including pain rating scale, medication(s)/side effects and non-pharmacologic comfort measures Outcome: Progressing   Problem: Health Behavior/Discharge Planning: Goal: Ability to manage health-related needs will improve Outcome: Progressing   Problem: Clinical Measurements: Goal: Ability to maintain clinical measurements within normal limits will improve Outcome: Progressing Goal: Will remain free from infection Outcome: Progressing Goal: Diagnostic test results will improve Outcome: Progressing Goal: Respiratory complications will improve Outcome: Progressing Goal: Cardiovascular complication will be avoided Outcome: Progressing   Problem: Activity: Goal: Risk for activity intolerance will decrease Outcome: Progressing   Problem: Nutrition: Goal: Adequate nutrition will be maintained Outcome: Progressing   Problem: Coping: Goal: Level of anxiety will decrease Outcome: Progressing   Problem: Elimination: Goal: Will not experience complications related to bowel motility Outcome: Progressing Goal: Will not experience complications related to urinary retention Outcome: Progressing   Problem: Pain Managment: Goal: General experience of comfort will improve and/or be controlled Outcome: Progressing   Problem: Safety: Goal: Ability to remain free from injury will improve Outcome: Progressing   Problem: Skin Integrity: Goal: Risk for impaired skin integrity will decrease Outcome: Progressing   Problem: Activity: Goal: Ability to tolerate increased activity will improve Outcome: Progressing   Problem: Respiratory: Goal: Ability to maintain a clear airway and adequate ventilation will improve Outcome: Progressing   Problem: Role Relationship: Goal: Method of communication will improve Outcome: Progressing   Problem: Education: Goal: Knowledge of disease or condition  will improve Outcome: Progressing Goal: Knowledge of secondary prevention will improve (MUST DOCUMENT ALL) Outcome: Progressing Goal: Knowledge of patient specific risk factors will improve (DELETE if not current risk factor) Outcome: Progressing   Problem: Ischemic Stroke/TIA Tissue Perfusion: Goal: Complications of ischemic stroke/TIA will be minimized Outcome: Progressing   Problem: Coping: Goal: Will verbalize positive feelings about self Outcome: Progressing Goal: Will identify appropriate support needs Outcome: Progressing   Problem: Health Behavior/Discharge Planning: Goal: Ability to manage health-related needs will improve Outcome: Progressing Goal: Goals will be collaboratively established with patient/family Outcome: Progressing   Problem: Self-Care: Goal: Ability to participate in self-care as condition permits will improve Outcome: Progressing Goal: Verbalization of feelings and concerns over difficulty with self-care will improve Outcome: Progressing Goal: Ability to communicate needs accurately will improve Outcome: Progressing   Problem: Nutrition: Goal: Risk of aspiration will decrease Outcome: Progressing Goal: Dietary intake will improve Outcome: Progressing   Problem: Education: Goal: Knowledge of disease or condition will improve Outcome: Progressing Goal: Knowledge of secondary prevention will improve (MUST DOCUMENT ALL) Outcome: Progressing Goal: Knowledge of patient specific risk factors will improve (DELETE if not current risk factor) Outcome: Progressing   Problem: Ischemic Stroke/TIA Tissue Perfusion: Goal: Complications of ischemic stroke/TIA will be minimized Outcome: Progressing   Problem: Coping: Goal: Will verbalize positive feelings about self Outcome: Progressing Goal: Will identify appropriate support needs Outcome: Progressing   Problem: Health Behavior/Discharge Planning: Goal: Ability to manage health-related needs will  improve Outcome: Progressing Goal: Goals will be collaboratively established with patient/family Outcome: Progressing   Problem: Self-Care: Goal: Ability to participate in self-care as condition permits will improve Outcome: Progressing Goal: Verbalization of feelings and concerns over difficulty with self-care will improve Outcome: Progressing Goal: Ability to communicate needs accurately will improve Outcome: Progressing   Problem: Nutrition: Goal: Risk of aspiration will decrease Outcome: Progressing Goal: Dietary intake will improve Outcome: Progressing   Problem: Education: Goal: Knowledge of disease or condition will improve Outcome: Progressing Goal: Knowledge of secondary prevention will  improve (MUST DOCUMENT ALL) Outcome: Progressing Goal: Knowledge of patient specific risk factors will improve (DELETE if not current risk factor) Outcome: Progressing   Problem: Ischemic Stroke/TIA Tissue Perfusion: Goal: Complications of ischemic stroke/TIA will be minimized Outcome: Progressing   "

## 2024-11-18 ENCOUNTER — Other Ambulatory Visit (HOSPITAL_COMMUNITY): Payer: Self-pay

## 2024-11-18 DIAGNOSIS — E43 Unspecified severe protein-calorie malnutrition: Secondary | ICD-10-CM | POA: Insufficient documentation

## 2024-11-18 DIAGNOSIS — Z9889 Other specified postprocedural states: Secondary | ICD-10-CM | POA: Diagnosis not present

## 2024-11-18 DIAGNOSIS — Z8719 Personal history of other diseases of the digestive system: Secondary | ICD-10-CM | POA: Diagnosis not present

## 2024-11-18 LAB — BASIC METABOLIC PANEL WITH GFR
Anion gap: 12 (ref 5–15)
BUN: 57 mg/dL — ABNORMAL HIGH (ref 8–23)
CO2: 24 mmol/L (ref 22–32)
Calcium: 9.5 mg/dL (ref 8.9–10.3)
Chloride: 100 mmol/L (ref 98–111)
Creatinine, Ser: 1.58 mg/dL — ABNORMAL HIGH (ref 0.44–1.00)
GFR, Estimated: 35 mL/min — ABNORMAL LOW
Glucose, Bld: 113 mg/dL — ABNORMAL HIGH (ref 70–99)
Potassium: 4.1 mmol/L (ref 3.5–5.1)
Sodium: 136 mmol/L (ref 135–145)

## 2024-11-18 LAB — CBC
HCT: 25.7 % — ABNORMAL LOW (ref 36.0–46.0)
Hemoglobin: 8.6 g/dL — ABNORMAL LOW (ref 12.0–15.0)
MCH: 32 pg (ref 26.0–34.0)
MCHC: 33.5 g/dL (ref 30.0–36.0)
MCV: 95.5 fL (ref 80.0–100.0)
Platelets: 316 K/uL (ref 150–400)
RBC: 2.69 MIL/uL — ABNORMAL LOW (ref 3.87–5.11)
RDW: 15.4 % (ref 11.5–15.5)
WBC: 6.8 K/uL (ref 4.0–10.5)
nRBC: 0 % (ref 0.0–0.2)

## 2024-11-18 LAB — GLUCOSE, CAPILLARY
Glucose-Capillary: 101 mg/dL — ABNORMAL HIGH (ref 70–99)
Glucose-Capillary: 104 mg/dL — ABNORMAL HIGH (ref 70–99)
Glucose-Capillary: 107 mg/dL — ABNORMAL HIGH (ref 70–99)
Glucose-Capillary: 109 mg/dL — ABNORMAL HIGH (ref 70–99)
Glucose-Capillary: 109 mg/dL — ABNORMAL HIGH (ref 70–99)
Glucose-Capillary: 112 mg/dL — ABNORMAL HIGH (ref 70–99)
Glucose-Capillary: 115 mg/dL — ABNORMAL HIGH (ref 70–99)

## 2024-11-18 MED ORDER — IOHEXOL 9 MG/ML PO SOLN
500.0000 mL | Freq: Once | ORAL | Status: AC
  Administered 2024-11-18: 500 mL

## 2024-11-18 MED ORDER — DEXTROSE-SODIUM CHLORIDE 5-0.45 % IV SOLN: INTRAVENOUS | Status: AC

## 2024-11-18 MED ORDER — CEFAZOLIN SODIUM-DEXTROSE 2-4 GM/100ML-% IV SOLN: 2.0000 g | Freq: Once | INTRAVENOUS | Status: DC

## 2024-11-18 MED ORDER — KATE FARMS STANDARD 1.4 EN LIQD
720.0000 mL | ENTERAL | Status: DC
  Administered 2024-11-18: 720 mL
  Filled 2024-11-18 (×3): qty 720

## 2024-11-18 NOTE — Consult Note (Signed)
 "   Chief Complaint: dysphagia  Referring Provider(s): Elgergawy, Brayton, MD  Supervising Physician: Jenna Hacker  Patient Status: [facility] - In-pt  History of Present Illness: Erin [name] is a 68 y.o. female with medical history significant for HTN, HLD, CAD, ischemic cardiomyopathy, PVD, T2DM, GERD, IBS, and anal cancer who presented to [facility] on 12/3 for elective hernia repair. Patient was taken back to the OR for hemoperitoneum the next day. On 12/8 CT head revealed interval development of multiple cortical and subcortical edema areas involving bilateral cerebral and cerebellar hemispheres. These areas are highly suggestive of embolic infarcts. MR brain on 12/8 revealed numerous scattered areas of restricted diffusion throughout the cerebral hemispheres and cerebellum concerning for acute infarcts of embolic etiology. Patient has been working with speech therapy, but has required tube feeds for 3 weeks. Received request for image guided gastrostomy tube.   Case was approved by Dr. Johann. Aspirin  has been on hold since 12/25.  Subjective: Patient lying in bed. She is able to state her name. States she feels okay today.  Allergies Reviewed:  Latex, Amitiza  [lubiprostone ], Nortriptyline , Sulfa antibiotics, Tramadol , Atorvastatin , and Claritin [loratadine]   Patient is Full Code  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/14 60.4Gy/22fx and chemo   Anxiety    Aortoiliac occlusive disease (HCC) 08/14/2017   Arthritis    B12 deficiency anemia 09/14/2015   Blood transfusion without reported diagnosis    BPPV (benign paroxysmal positional vertigo) 07/08/2015   Cardiomyopathy (HCC) 11/03/2017   Chronic back pain    Chronic daily headache 03/29/2013   takes bc powder   Closed right hip fracture (HCC) 09/10/2015   Coronary artery disease involving native  coronary artery of native heart without angina pectoris 10/13/2017   DES to mid RCA   Depression    Essential (hemorrhagic) thrombocythemia (HCC) 09/06/2018   Essential hypertension 09/25/2023   Family history of early CAD 04/08/2016   GERD (gastroesophageal reflux disease)    History of hiatal hernia    Hot flashes    Hyperlipidemia    Hyperlipidemia associated with type 2 diabetes mellitus (HCC) 05/11/2017   Hypothyroidism 09/10/2015   IBS (irritable bowel syndrome) 03/29/2013   Neck pain 07/08/2015   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   takes neurotin   Peripheral vascular disease 11/03/2017   QT prolongation    Sacroiliitis 09/06/2018   Spasms of the hands or feet 10/08/2017   Syncope 06/07/2016   Tubular adenoma of colon 09/08/2003   Vertigo    Wears glasses     Past Surgical History:  Procedure Laterality Date   ABDOMINAL AORTOGRAM N/A 08/02/2017   Procedure: ABDOMINAL AORTOGRAM;  Surgeon: Darron Deatrice LABOR, MD;  [facility]: [facility];  Service: Cardiovascular;  Laterality: N/A;   ABDOMINAL AORTOGRAM W/LOWER EXTREMITY Bilateral 09/03/2024   Procedure: ABDOMINAL AORTOGRAM W/LOWER EXTREMITY;  Surgeon: Serene Gaile ORN, MD;  Location: [facility];  Service: Cardiovascular;  Laterality: Bilateral;   AORTA - BILATERAL FEMORAL ARTERY BYPASS GRAFT N/A 08/14/2017   Procedure: AORTA BIFEMORAL BYPASS GRAFT;  Surgeon: Oris Krystal FALCON, MD;  Location: [facility] OR;  Service: Vascular;  Laterality: N/A;   COLONOSCOPY     COLONOSCOPY N/A 08/29/2024   Procedure: COLONOSCOPY;  Surgeon: Wilhelmenia Aloha Raddle., MD;  Location: [facility];  Service: Gastroenterology;  Laterality: N/A;   CORONARY STENT INTERVENTION N/A 10/13/2017   Procedure:  CORONARY STENT INTERVENTION;  Surgeon: Mady Bruckner, MD;  Location: [facility];  Service: Cardiovascular;  Laterality: N/A;   dental implant     ECTOPIC PREGNANCY SURGERY     EMBOLECTOMY N/A 08/14/2017   Procedure: EMBOLECTOMY FEMORAL;   Surgeon: Oris Krystal FALCON, MD;  Location: [facility] OR;  Service: Vascular;  Laterality: N/A;   FEMORAL-POPLITEAL BYPASS GRAFT Right 08/14/2017   Procedure: Right Femoral to Above Knee Popliteal Bypass Graft using Non-Reversed Greater Saphenous Vein Graft from Right Leg;  Surgeon: Oris Krystal FALCON, MD;  Location: [facility] OR;  Service: Vascular;  Laterality: Right;   FLEXIBLE SIGMOIDOSCOPY N/A 08/14/2013   Procedure: FLEXIBLE SIGMOIDOSCOPY;  Surgeon: Gwendlyn ONEIDA Buddy, MD;  Location: [facility];  Service: Endoscopy;  Laterality: N/A;   LAPAROTOMY N/A 10/24/2024   Procedure: EXPLORATORY LAPAROTOMY, ABDOMINAL PACKING AND WOUND VAC APPLICATION;  Surgeon: Debby Hila, MD;  Location: [facility] ORS;  Service: General;  Laterality: N/A;  (8) EIGHT 18X18 LAP SPONGE PACKED   LAPAROTOMY N/A 10/25/2024   Procedure: LAPAROTOMY, EXPLORATORY;  Surgeon: Rubin Calamity, MD;  Location: [facility] OR[facility];  Service: General;  Laterality: N/A;  REMOVAL OF PACKING   LEFT HEART CATH AND CORONARY ANGIOGRAPHY N/A 10/13/2017   Procedure: LEFT HEART CATH AND CORONARY ANGIOGRAPHY;  Surgeon: Mady Bruckner, MD;  [facility]: [facility];  Service: Cardiovascular;  Laterality: N/A;   LOWER EXTREMITY ANGIOGRAPHY Bilateral 08/02/2017   Procedure: Lower Extremity Angiography;  Surgeon: Darron Deatrice LABOR, MD;  Location: [facility] INVASIVE CV LAB;  Service: Cardiovascular;  Laterality: Bilateral;   LOWER EXTREMITY INTERVENTION N/A 09/03/2024   Procedure: LOWER EXTREMITY INTERVENTION;  Surgeon: Serene Gaile ORN, MD;  [facility]: [facility];  Service: Cardiovascular;  Laterality: N/A;   MULTIPLE TOOTH EXTRACTIONS     PERIPHERAL VASCULAR INTERVENTION  05/22/2018   Procedure: PERIPHERAL VASCULAR INTERVENTION;  Surgeon: Serene Gaile ORN, MD;  [facility];  Service: Cardiovascular;;  SMA and Celiac   PILONIDAL CYST EXCISION     POLYPECTOMY     THROMBECTOMY FEMORAL ARTERY Right 08/14/2017   Procedure: THROMBECTOMY FEMORAL ARTERY;  Surgeon: Oris Krystal FALCON, MD;  Location: [facility] OR;  Service: Vascular;  Laterality: Right;   TONSILLECTOMY     TOTAL HIP ARTHROPLASTY  09/11/2015   Procedure: TOTAL HIP ARTHROPLASTY;  Surgeon: Evalene JONETTA Chancy, MD;  Location: [facility];  Service: Orthopedics;;   ULTRASOUND GUIDANCE FOR VASCULAR ACCESS  10/13/2017   Procedure: Ultrasound Guidance For Vascular Access;  Surgeon: Mady Bruckner, MD;  [facility]: [facility];  Service: Cardiovascular;;   VENTRAL HERNIA REPAIR N/A 10/23/2024   Procedure: REPAIR, HERNIA, VENTRAL, LAPAROSCOPIC;  Surgeon: Rubin Calamity, MD;  Location: [facility];  Service: General;  Laterality: N/A;   VISCERAL ANGIOGRAPHY N/A 03/03/2020   Procedure: MESTENRIC ANGIOGRAPHY;  Surgeon: Serene Gaile ORN, MD;  [facility];  Service: Cardiovascular;  Laterality: N/A;   VISCERAL ANGIOGRAPHY N/A 09/03/2024   Procedure: VISCERAL ANGIOGRAPHY;  Surgeon: Serene Gaile ORN, MD;  [facility]: [facility];  Service: Cardiovascular;  Laterality: N/A;   VISCERAL ARTERY INTERVENTION N/A 09/03/2024   Procedure: VISCERAL ARTERY INTERVENTION;  Surgeon: Serene Gaile ORN, MD;  [facility]: [facility];  Service: Cardiovascular;  Laterality: N/A;      Medications: Prior to Admission medications  Medication Sig Start Date End Date Taking? Authorizing Provider  acetaminophen  (TYLENOL ) 500 MG tablet Take 1,000 mg by mouth every 6 (six) hours as needed for headache (pain).   Yes [provider]  acyclovir  ointment (ZOVIRAX ) 5 % Apply 1 Application topically every 3 (three) hours as needed (fever blisters). 10/24/22  Yes [name] HERO, MD  amLODipine  (NORVASC ) 5 MG tablet Take 10 mg by mouth daily. 09/12/24  Yes [provider]  aspirin  EC 81 MG tablet Take 1 tablet (81 mg total) by mouth daily. Swallow whole. 06/19/24  Yes Weaver, Scott T, PA-C  carvedilol  (COREG ) 12.5 MG tablet Take 12.5 mg by mouth 2 (two) times daily. 09/12/24  Yes [provider]  Cholecalciferol  (VITAMIN D ) 2000 units tablet Take 2,000 Units by mouth daily.   Yes [provider]  clopidogrel  (PLAVIX ) 75 MG tablet Take 1 tablet (75 mg total) by mouth daily. 08/12/24  Yes Serene Gaile ORN, MD  DULoxetine  (CYMBALTA ) 20 MG capsule TAKE 2-3 CAPSULES BY MOUTH EVERY DAY 02/26/24  Yes [name] HERO, MD  fluticasone  (FLONASE ) 50 MCG/ACT nasal spray SPRAY 1 SPRAY INTO EACH NOSTRIL DAILY 02/17/24  Yes [name] HERO, MD  gabapentin  (NEURONTIN ) 600 MG tablet TAKE 1 TABLET BY MOUTH THREE TIMES A DAY 05/29/24  Yes [name] HERO, MD  ketoconazole  (NIZORAL ) 2 % shampoo Apply to hair daily for 7 days -- leave on for 5 minutes, then wash every other day for 7 days, then wash twice a week for 7 days,  then once a week. 04/01/24  Yes [name] HERO, MD  levothyroxine  (SYNTHROID ) 75 MCG tablet TAKE 1 TABLET BY MOUTH DAILY BEFORE BREAKFAST MONDAY-FRIDAY, THEN 1/2 SATURDAY AND SUNDAY 06/28/24  Yes [name] HERO, MD  metoCLOPramide  (REGLAN ) 5 MG tablet TAKE 1 TABLET BY MOUTH EVERY 6 HOURS AS NEEDED FOR NAUSEA. 08/12/24  Yes [name] HERO, MD  nitroGLYCERIN  (NITROSTAT ) 0.4 MG SL tablet Place 1 tablet (0.4 mg total) under the tongue every 5 (five) minutes as needed for chest pain. 04/03/24 10/15/24 Yes End, Lonni, MD  nystatin -triamcinolone  (MYCOLOG II) cream Apply 1 application topically 2 (two) times daily as needed (dry skin).  03/30/16  Yes [provider]  oxyCODONE -acetaminophen  (PERCOCET) 5-325 MG tablet Take 1 tablet by mouth every 4 (four) hours as needed for severe pain (pain score 7-10). 10/23/24 10/23/25 Yes Rubin Calamity, MD  pantoprazole  (PROTONIX ) 40 MG tablet TAKE 1 TABLET BY MOUTH TWICE A DAY 08/12/24  Yes McGreal, Inocente HERO, MD  potassium chloride  SA (KLOR-CON  M20) 20 MEQ tablet Take 1 tablet (20 mEq total) by mouth 2 (two) times daily. 06/24/24  Yes Weaver, Scott T, PA-C  rOPINIRole  (REQUIP ) 1 MG tablet Take 1 tablet (1 mg total) by mouth at bedtime. May Increase to  2 tablet at bedtime after 7 days if needed. 09/13/24  Yes [name] HERO, MD  rosuvastatin  (CRESTOR ) 40 MG tablet Take 1 tablet (40 mg total) by mouth daily. 09/13/24  Yes [name] HERO, MD  tiZANidine (ZANAFLEX) 4 MG tablet Take 2 mg by mouth 3 (three) times daily. 12/11/20  Yes [provider]  topiramate  (TOPAMAX ) 50 MG tablet Take 50 mg by mouth 2 (two) times daily.   Yes [provider]  vitamin B-12 1000 MCG tablet Take 1 tablet (1,000 mcg total) by mouth daily. 09/14/15  Yes Dhungel, Nishant, MD  hydrALAZINE  (APRESOLINE ) 25 MG tablet Take 1 tab once daily as needed for elevated BP (>160/95) 07/17/24   Lelon Hamilton T, PA-C  meclizine  (ANTIVERT ) 25 MG tablet TAKE 1 TABLET BY MOUTH THREE TIMES A DAY AS NEEDED FOR DIZZINESS 05/01/24   [name] HERO, MD  Family History  Problem Relation Age of Onset   Dementia Mother    Arthritis Mother    Hyperlipidemia Mother    Heart disease Mother    Hypertension Mother    Stroke Mother 6   Irritable bowel syndrome Mother    Thyroid  disease Mother    Heart attack Mother    Heart disease Father    Hyperlipidemia Father    Hypertension Father    Stroke Father 39   Thyroid  disease Father    Prostate cancer Father    Heart attack Father    Lung cancer Brother 15   Heart attack Maternal Uncle    Heart attack Maternal Grandfather    Colon cancer Neg Hx    Rectal cancer Neg Hx    Stomach cancer Neg Hx    Esophageal cancer Neg Hx     Social History   Socioeconomic History   Marital status: Married    Spouse name: Not on file   Number of children: 0   Years of education: Not on file   Highest education level: Not on file  Occupational History   Occupation: caregiver  Tobacco Use   Smoking status: Former    Current packs/day: 0.00    Average packs/day: 0.5 packs/day    Types: Cigarettes    Quit date: 10/07/2014    Years since quitting: 10.1   Smokeless tobacco: Never  Vaping Use   Vaping status:  Never Used  Substance and Sexual Activity   Alcohol use: No    Alcohol/week: 0.0 standard drinks of alcohol   Drug use: No   Sexual activity: Yes    Partners: Male    Birth control/protection: Post-menopausal  Other Topics Concern   Not on file  Social History Narrative   Work or School: homemaker      Home Situation: lives with her husband,[name] takes care of her elderly parents       Spiritual Beliefs: Christian      Lifestyle: no regular exercise, poor diet      Right handed             Social Drivers of Health   Tobacco Use: Medium Risk (10/23/2024)   Patient History    Smoking Tobacco Use: Former    Smokeless Tobacco Use: Never    Passive Exposure: Not on Actuary Strain: Low Risk (02/26/2024)   Overall Financial Resource Strain (CARDIA)    Difficulty of Paying Living Expenses: Not hard at all  Food Insecurity: No Food Insecurity (10/24/2024)   Epic    Worried About Radiation Protection Practitioner of Food in the Last Year: Never true    Ran Out of Food in the Last Year: Never true  Transportation Needs: No Transportation Needs (10/24/2024)   Epic    Lack of Transportation (Medical): No    Lack of Transportation (Non-Medical): No  Physical Activity: Inactive (02/26/2024)   Exercise Vital Sign    Days of Exercise per Week: 0 days    Minutes of Exercise per Session: 0 min  Stress: No Stress Concern Present (02/26/2024)   [employer] of Occupational Health - Occupational Stress Questionnaire    Feeling of Stress : Not at all  Social Connections: Socially Integrated (10/24/2024)   Social Connection and Isolation Panel    Frequency of Communication with Friends and Family: More than three times a week    Frequency of Social Gatherings with Friends and Family: More than three times a week    Attends  Religious Services: More than 4 times per year    Active Member of Clubs or Organizations: Yes    Attends Club or Organization Meetings: More than 4 times per year     Marital Status: Married  Depression (PHQ2-9): Low Risk (02/26/2024)   Depression (PHQ2-9)    PHQ-2 Score: 0  Alcohol Screen: Low Risk (02/26/2024)   Alcohol Screen    Last Alcohol Screening Score (AUDIT): 0  Housing: Low Risk (10/24/2024)   Epic    Unable to Pay for Housing in the Last Year: No    Number of Times Moved in the Last Year: 0    Homeless in the Last Year: No  Utilities: Not At Risk (10/24/2024)   Epic    Threatened with loss of utilities: No  Health Literacy: Adequate Health Literacy (02/26/2024)   B1300 Health Literacy    Frequency of need for help with medical instructions: Never     Review of Systems: A 12 point ROS discussed and pertinent positives are indicated in the HPI above.  All other systems are negative.    Vital Signs: BP (!) 141/60   Pulse 80   Temp (!) 97.4 F (36.3 C) (Axillary)   Resp 20   Ht 5' 3 (1.6 m)   Wt 100 lb 12 oz (45.7 kg)   SpO2 99%   BMI 17.85 kg/m   Advance Care Plan: no documents on file    Physical Exam HENT:     Nose:     Comments: Cortrak present    Mouth/Throat:     Mouth: Mucous membranes are moist.  Cardiovascular:     Rate and Rhythm: Normal rate and regular rhythm.  Pulmonary:     Effort: Pulmonary effort is normal. No respiratory distress.     Breath sounds: Normal breath sounds.  Abdominal:     Comments: Midline abdominal surgical site with dressing in place; clean, dry, and intact  Neurological:     Mental Status: She is alert. She is disoriented.     Comments: Patient able to state her name     Imaging: CT ABDOMEN WO CONTRAST Result Date: 11/13/2024 EXAM: CT ABDOMEN WITHOUT CONTRAST 11/13/2024 01:49:00 PM TECHNIQUE: CT of the abdomen was performed without the administration of intravenous contrast. Multiplanar reformatted images are provided for review. Automated exposure control, iterative reconstruction, and/or weight based adjustment of the mA/kV was utilized to reduce the radiation dose to as low as  reasonably achievable. COMPARISON: 10/24/2024 and previous. CLINICAL HISTORY: Anatomy evaluation for G-tube placement in IR. FINDINGS: LOWER CHEST: Interval improvement in small pleural effusions with resolving patchy atelectasis/consolidation posteriorly at both lung bases, worse left than right. Blood pool is hypodense compared to the interventricular septum suggesting anemia. HEPATOBILIARY: The visualized liver is unremarkable. Gallbladder is unremarkable. SPLEEN: Spleen demonstrates no acute abnormality. PANCREAS: Pancreas demonstrates no acute abnormality. ADRENAL GLANDS: Adrenal glands demonstrate no acute abnormality. KIDNEYS: 3 mm calcification peripherally in the upper pole left renal collecting system. No hydronephrosis. No perinephric or periureteral stranding. GI AND BOWEL: Feeding tube extends to the proximal duodenum. There is safe percutaneous window for gastrostomy placement. Residual contrast in the visualized colon, which is nondilated. There is no bowel obstruction. No abnormal bowel wall thickening or distension. PERITONEUM AND RETROPERITONEUM: Aorta is normal in caliber. Origin stenting of celiac access and SMA. Post aortobifem. No ascites or free air. LYMPH NODES: No lymphadenopathy. BONES AND SOFT TISSUES: No acute abnormality of the visualized bones. No focal soft tissue abnormality. IMPRESSION: 1.  Safe percutaneous window for gastrostomy placement. 2. Findings suggest anemia. Electronically signed by: Katheleen Faes MD 11/13/2024 04:01 PM EST RP Workstation: [id number]   DG Swallowing Func-Speech Pathology Result Date: 11/11/2024 Table formatting from the original result was not included. Modified Barium Swallow Study Patient Details Name: Erin [name] MRN: [id number] Date of Birth: Oct 14, 1956 Today's Date: 11/11/2024 HPI/PMH: HPI: 68 yo F adm to [facility] 12/3 for ventral hernia repair. 12/4 ex lap with abd hemorrhage, remained intubated post op. 12/5 abdominal closure. 12/8 MRI: multiple  embolic infarcts. 12/9 transfer to [facility]. 12/11 extubated. BSE 11/01/24: NPO, ice chips after oral care. MBS 12/17: silent aspiration, DNFC; NPO. Significant issues with attn/cog. SLP following for cognitive-linguistic deficits and dysphagia. PMHx: anal CA, hypothyroidism, GERD, CAD, cardiomyopathy, PVD, HLD, HTN, RLS. Per 2023 MBS, mild pharyngeal dysphagia and suspected primary esophageal dysphagia. MBSS completed on 11/11/24 showed mild oropharygneal dysphagia with recommendations of a full liquid/thin liquid diet. Clinical Impression:  Pt presents with a mild oropharyngeal dysphagia per results of MBSS completed today. No aspiration observed; however, there was deep penetration of thin liquid residue to the level of the vocal cords, which increases pt's risk of aspiration. Recommend initiation of a full liquids, thin liquids diet. SLP will follow to assess diet tolerance and clinically advance diet as indicated. Consider a repeat MBSS once Cortrak is removed. Oral deficits characterized by increased oral prep and transit time with soft solid. Pt able to clear bolus with additional time. Pharyngeal deficits characterized by reduced hyolaryngeal elevation/excursion, reduced base of tongue retraction, incomplete epiglottic inversion, reduced laryngeal vestibule closure, and reduced pharyngeal stripping. Findings -No aspiration observed across trials. -There was transient and stagnant penetration of thin liquids and nectar-thick liquids during and after the swallow. Pt eventually cleared penetrated residue with cued swallow or cued throat clear (followed direction x1 occurrence). -There was min-moderate vallecular and pyriform residue following the majority of residue. Amount of residue appeared to increased as the viscosity increased. *some frames were not recorded due to equipment issue. Detailed diet recommendations and aspiration precautions outlined below. Continue SLP PoC. Factors that may increase risk of  adverse event in presence of aspiration Noe & Lianne 2021): Factors that may increase risk of adverse event in presence of aspiration Noe & Lianne 2021): Reduced cognitive function; Frail or deconditioned; Dependence for feeding and/or oral hygiene Recommendations/Plan: Swallowing Evaluation Recommendations Swallowing Evaluation Recommendations Recommendations: PO diet PO Diet Recommendation: Full liquid diet; Thin liquids (Level 0) Liquid Administration via: Cup; No straw Medication Administration: Crushed with puree Supervision: Staff to assist with self-feeding; Full supervision/cueing for swallowing strategies Swallowing strategies  : Minimize environmental distractions; Slow rate; Small bites/sips; Multiple dry swallows after each bite/sip Postural changes: Position pt fully upright for meals; Stay upright 30-60 min after meals Oral care recommendations: Oral care BID (2x/day) Treatment Plan Treatment Plan Treatment recommendations: Therapy as outlined in treatment plan below Follow-up recommendations: Follow physicians's recommendations for discharge plan and follow up therapies Functional status assessment: Patient has had a recent decline in their functional status and demonstrates the ability to make significant improvements in function in a reasonable and predictable amount of time. Treatment frequency: Min 2x/week Treatment duration: 3 weeks Interventions: Aspiration precaution training; Oropharyngeal exercises; Compensatory techniques; Patient/family education; Trials of upgraded texture/liquids; Diet toleration management by SLP Recommendations Recommendations for follow up therapy are one component of a multi-disciplinary discharge planning process, led by the attending physician.  Recommendations may be updated based on patient status, additional functional criteria and insurance authorization.  Assessment: Orofacial Exam: Orofacial Exam Oral Cavity: Oral Hygiene: WFL Oral Cavity - Dentition:  Missing dentition Orofacial Anatomy: WFL Oral Motor/Sensory Function: -- (unable to assess d/t cognitive deficits) Anatomy: Anatomy: WFL Boluses Administered: Boluses Administered Boluses Administered: Thin liquids (Level 0); Mildly thick liquids (Level 2, nectar thick); Puree; Solid  Oral Impairment Domain: Oral Impairment Domain Lip Closure: No labial escape Tongue control during bolus hold: Not tested Bolus preparation/mastication: Slow prolonged chewing/mashing with complete recollection Bolus transport/lingual motion: Brisk tongue motion Oral residue: Trace residue lining oral structures Initiation of pharyngeal swallow : Valleculae  Pharyngeal Impairment Domain: Pharyngeal Impairment Domain Soft palate elevation: No bolus between soft palate (SP)/pharyngeal wall (PW) Laryngeal elevation: Partial superior movement of thyroid  cartilage/partial approximation of arytenoids to epiglottic petiole Anterior hyoid excursion: Partial anterior movement Epiglottic movement: Partial inversion Laryngeal vestibule closure: Incomplete, narrow column air/contrast in laryngeal vestibule Pharyngeal stripping wave : Present - diminished Pharyngeal contraction (A/P view only): N/A Pharyngoesophageal segment opening: Complete distension and complete duration, no obstruction of flow Tongue base retraction: Narrow column of contrast or air between tongue base and PPW Pharyngeal residue: Collection of residue within or on pharyngeal structures Location of pharyngeal residue: Valleculae; Pyriform sinuses  Esophageal Impairment Domain: Esophageal Impairment Domain Esophageal clearance upright position: -- (not assessed) Pill: Pill Consistency administered: -- (not assessed) Penetration/Aspiration Scale Score: Penetration/Aspiration Scale Score 1.  Material does not enter airway: Puree; Solid 2.  Material enters airway, remains ABOVE vocal cords then ejected out: Thin liquids (Level 0); Mildly thick liquids (Level 2, nectar thick) 3.   Material enters airway, remains ABOVE vocal cords and not ejected out: Mildly thick liquids (Level 2, nectar thick) 5.  Material enters airway, CONTACTS cords and not ejected out: Thin liquids (Level 0) Compensatory Strategies: Compensatory Strategies Compensatory strategies: Yes Other(comment): Effective Effective Other(comment): Puree; Solid; Thin liquid (Level 0) (downward pressure on tongue with spoon to cue dry swallow)   General Information: Caregiver present: No  Diet Prior to this Study: NPO   Temperature : Normal   Respiratory Status: WFL   Supplemental O2: None (Room air)   History of Recent Intubation: Yes  Behavior/Cognition: Alert; Cooperative Self-Feeding Abilities: Needs assist with self-feeding Baseline vocal quality/speech: Normal Volitional Cough: Unable to elicit Volitional Swallow: Unable to elicit Exam Limitations: No limitations Goal Planning: Prognosis for improved oropharyngeal function: Good Barriers to Reach Goals: Cognitive deficits; Language deficits No data recorded Patient/Family Stated Goal: discharge home Consulted and agree with results and recommendations: Patient; Family member/caregiver; Nurse; Physician Pain: Pain Assessment Pain Assessment: No/denies pain Pain Score: 0 Faces Pain Scale: 0 Breathing: 0 Negative Vocalization: 0 Facial Expression: 0 Body Language: 0 Consolability: 0 PAINAD Score: 0 End of Session: Start Time:SLP Start Time (ACUTE ONLY): 1330 Stop Time: SLP Stop Time (ACUTE ONLY): 1349 Time Calculation:SLP Time Calculation (min) (ACUTE ONLY): 19 min Charges: SLP Evaluations $ SLP Speech Visit: 1 Visit SLP Evaluations $MBS Swallow: 1 Procedure $Swallowing Treatment: 1 Procedure SLP visit diagnosis: SLP Visit Diagnosis: Dysphagia, pharyngeal phase (R13.13); Cognitive communication deficit [phone]) Past Medical History: Past Medical History: Diagnosis Date  Allergy   Alopecia   Anal cancer (HCC) 08/14/2013  invasive squamous cell ca, s/p radiation 10/20-11/26/14  60.4Gy/65fx and chemo  Anxiety   Aortoiliac occlusive disease (HCC) 08/14/2017  Arthritis   B12 deficiency anemia 09/14/2015  Blood transfusion without reported diagnosis   BPPV (benign paroxysmal positional vertigo) 07/08/2015  Cardiomyopathy (HCC) 11/03/2017  Chronic back pain   Chronic daily headache 03/29/2013  takes bc powder  Closed right hip fracture (HCC) 09/10/2015  Coronary artery disease involving native coronary artery of native heart without angina pectoris 10/13/2017  DES to mid RCA  Depression   Essential (hemorrhagic) thrombocythemia (HCC) 09/06/2018  Essential hypertension 09/25/2023  Family history of early CAD 04/08/2016  GERD (gastroesophageal reflux disease)   History of hiatal hernia   Hot flashes   Hyperlipidemia   Hyperlipidemia associated with type 2 diabetes mellitus (HCC) 05/11/2017  Hypothyroidism 09/10/2015  IBS (irritable bowel syndrome) 03/29/2013  Neck pain 07/08/2015  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  takes neurotin  Peripheral vascular disease 11/03/2017  QT prolongation   Sacroiliitis 09/06/2018  Spasms of the hands or feet 10/08/2017  Syncope 06/07/2016  Tubular adenoma of colon 09/08/2003  Vertigo   Wears glasses  Past Surgical History: Past Surgical History: Procedure Laterality Date  ABDOMINAL AORTOGRAM N/A 08/02/2017  Procedure: ABDOMINAL AORTOGRAM;  Surgeon: Darron Deatrice LABOR, MD;  [facility];  Service: Cardiovascular;  Laterality: N/A;  ABDOMINAL AORTOGRAM W/LOWER EXTREMITY Bilateral 09/03/2024  Procedure: ABDOMINAL AORTOGRAM W/LOWER EXTREMITY;  Surgeon: Serene Gaile ORN, MD;  [facility];  Service: Cardiovascular;  Laterality: Bilateral;  AORTA - BILATERAL FEMORAL ARTERY BYPASS GRAFT N/A 08/14/2017  Procedure: AORTA BIFEMORAL BYPASS GRAFT;  Surgeon: Oris Krystal FALCON, MD;  Location: [facility] OR;  Service: Vascular;  Laterality: N/A;  COLONOSCOPY    COLONOSCOPY N/A 08/29/2024  Procedure: COLONOSCOPY;  Surgeon: Wilhelmenia Aloha Raddle., MD;  Location: [facility];  Service: Gastroenterology;  Laterality: N/A;  CORONARY STENT INTERVENTION N/A 10/13/2017  Procedure: CORONARY STENT INTERVENTION;  Surgeon: Mady Bruckner, MD;  [facility];  Service: Cardiovascular;  Laterality: N/A;  dental implant    ECTOPIC PREGNANCY SURGERY    EMBOLECTOMY N/A 08/14/2017  Procedure: EMBOLECTOMY FEMORAL;  Surgeon: Oris Krystal FALCON, MD;  Location: [facility] OR;  Service: Vascular;  Laterality: N/A;  FEMORAL-POPLITEAL BYPASS GRAFT Right 08/14/2017  Procedure: Right Femoral to Above Knee Popliteal Bypass Graft using Non-Reversed Greater Saphenous Vein Graft from Right Leg;  Surgeon: Oris Krystal FALCON, MD;  Location: [facility] OR;  Service: Vascular;  Laterality: Right;  FLEXIBLE SIGMOIDOSCOPY N/A 08/14/2013  Procedure: FLEXIBLE SIGMOIDOSCOPY;  Surgeon: Gwendlyn ONEIDA Buddy, MD;  [facility];  Service: Endoscopy;  Laterality: N/A;  LAPAROTOMY N/A 10/24/2024  Procedure: EXPLORATORY LAPAROTOMY, ABDOMINAL PACKING AND WOUND VAC APPLICATION;  Surgeon: Debby Hila, MD;  Location: [facility];  Service: General;  Laterality: N/A;  (8) EIGHT 18X18 LAP SPONGE PACKED  LAPAROTOMY N/A 10/25/2024  Procedure: LAPAROTOMY, EXPLORATORY;  Surgeon: Rubin Calamity, MD;  Location: [facility];  Service: General;  Laterality: N/A;  REMOVAL OF PACKING  LEFT HEART CATH AND CORONARY ANGIOGRAPHY N/A 10/13/2017  Procedure: LEFT HEART CATH AND CORONARY ANGIOGRAPHY;  Surgeon: Mady Bruckner, MD;  [facility];  Service: Cardiovascular;  Laterality: N/A;  LOWER EXTREMITY ANGIOGRAPHY Bilateral 08/02/2017  Procedure: Lower Extremity Angiography;  Surgeon: Darron Deatrice LABOR, MD;  Location: [facility] INVASIVE CV LAB;  Service: Cardiovascular;  Laterality: Bilateral;  LOWER EXTREMITY INTERVENTION N/A 09/03/2024  Procedure: LOWER EXTREMITY INTERVENTION;  Surgeon: Serene Gaile ORN, MD;  [facility];  Service: Cardiovascular;  Laterality: N/A;  MULTIPLE TOOTH EXTRACTIONS    PERIPHERAL VASCULAR  INTERVENTION  05/22/2018  Procedure: PERIPHERAL VASCULAR INTERVENTION;  Surgeon: Serene Gaile ORN, MD;  [facility];  Service: Cardiovascular;;  SMA and Celiac  PILONIDAL CYST EXCISION    POLYPECTOMY    THROMBECTOMY FEMORAL ARTERY Right 08/14/2017  Procedure: THROMBECTOMY FEMORAL ARTERY;  Surgeon: Oris Krystal FALCON, MD;  Location: [facility] OR;  Service: Vascular;  Laterality: Right;  TONSILLECTOMY    TOTAL HIP ARTHROPLASTY  09/11/2015  Procedure: TOTAL HIP ARTHROPLASTY;  Surgeon: Evalene JONETTA Chancy, MD;  [facility];  Service: Orthopedics;;  ULTRASOUND GUIDANCE FOR VASCULAR ACCESS  10/13/2017  Procedure: Ultrasound Guidance For Vascular Access;  Surgeon: Mady Bruckner, MD;  [facility];  Service: Cardiovascular;;  VENTRAL HERNIA REPAIR N/A 10/23/2024  Procedure: REPAIR, HERNIA, VENTRAL, LAPAROSCOPIC;  Surgeon: Rubin Calamity, MD;  [facility];  Service: General;  Laterality: N/A;  VISCERAL ANGIOGRAPHY N/A 03/03/2020  Procedure: MESTENRIC ANGIOGRAPHY;  Surgeon: Serene Gaile ORN, MD;  [facility];  Service: Cardiovascular;  Laterality: N/A;  VISCERAL ANGIOGRAPHY N/A 09/03/2024  Procedure: VISCERAL ANGIOGRAPHY;  Surgeon: Serene Gaile ORN, MD;  [facility];  Service: Cardiovascular;  Laterality: N/A;  VISCERAL ARTERY INTERVENTION N/A 09/03/2024  Procedure: VISCERAL ARTERY INTERVENTION;  Surgeon: Serene Gaile ORN, MD;  [facility];  Service: Cardiovascular;  Laterality: N/A; [name] 11/11/2024, 3:17 PM  DG Chest Port 1 View Result Date: 11/07/2024 EXAM: 1 VIEW(S) XRAY OF THE CHEST 11/07/2024 06:03:23 AM COMPARISON: 11/01/2024 CLINICAL HISTORY: SOB (shortness of breath) FINDINGS: LINES, TUBES AND DEVICES: Enteric tube below diaphragm with distal tip beyond inferior margin of film. Left renal artery stent noted. LUNGS AND PLEURA: Right basilar airspace opacity. Left basilar atelectasis. No pleural effusion. No pneumothorax.  HEART AND MEDIASTINUM: Aortic atherosclerosis. No acute abnormality of the cardiac silhouette. BONES AND SOFT TISSUES: No acute osseous abnormality. IMPRESSION: 1. Right basilar airspace opacity and left basilar atelectasis. Electronically signed by: Waddell Calk MD 11/07/2024 06:35 AM EST RP Workstation: [id number]   DG Swallowing Func-Speech Pathology Result Date: 11/06/2024 Table formatting from the original result was not included. Modified Barium Swallow Study Patient Details Name: Erin [name] MRN: [id number] Date of Birth: [birth year] Today's Date: 11/06/2024 HPI/PMH: HPI: Mrs. Rinks is a 68 y.o. F  adm to [facility] on 12/3 for ventral hernia repair. 12/4 ex lap with abdominal hemorrhage and remained intubated post op. 12/5 abdominal closure. 12/8 MRI with multiple embolic infarcts. 12/9 transfer to [facility]. 12/11 extubated. PMHx: anal CA, hypothyroidism, GERD, CAD, cardiomyopathy, PVD, HLD, HTN, RLS. SLP consulted for cogntitive-linguistic assessment and clinical swallow assessment. Pt with a hx of mild pharyngeal dysphagia and suspected primary esophageal dysphagia per MBSS completed 05/06/22 Clinical Impression: Clinical Impression: Patient presents with severe pharyngeal dysphagia characterized by impaired effieciency and safety across all administered textures. At baseline, patient has mild pharyngeal dysphagia and suspect given current status, patient is unable to compensate for efficiency deficits resulting in airway compromise. Patient exhibits reduced hyolaryngeal excursion/elevation, epiglottic inversion (with epiglottis also abutting against posterior pharyngeal wall further preventing full inversion), pharyngeal stripping wave, and resultant closure of the laryngeal vestibule. Patient aspirates thin liquids silently during the swallow and mix of residue from thin, NTL, and HTLs after the swallow as appreciated by movement of residue material into the airway between images. Given current reduced  cognitive status, patient is unable to follow commands adequately to trial any compensatory strategies. Recommend NPO at this time with medications adminsitere via alternative means. SLP will continue to follow. Factors that may increase risk of adverse event in presence of aspiration Noe & Lianne 2021): Factors that may increase risk of adverse event in presence of aspiration Noe & Lianne 2021): Weak cough; Reduced cognitive function; Poor general health and/or compromised immunity Recommendations/Plan: Swallowing Evaluation  Recommendations Swallowing Evaluation Recommendations Recommendations: NPO Medication Administration: Via alternative means Oral care recommendations: Oral care QID (4x/day) Caregiver Recommendations: Have oral suction available Treatment Plan Treatment Plan Treatment recommendations: Therapy as outlined in treatment plan below Follow-up recommendations: Follow physicians's recommendations for discharge plan and follow up therapies Functional status assessment: Patient has had a recent decline in their functional status and demonstrates the ability to make significant improvements in function in a reasonable and predictable amount of time. Treatment frequency: Min 2x/week Treatment duration: 2 weeks Interventions: Aspiration precaution training; Oropharyngeal exercises; Compensatory techniques; Patient/family education; Trials of upgraded texture/liquids; Diet toleration management by SLP Recommendations Recommendations for follow up therapy are one component of a multi-disciplinary discharge planning process, led by the attending physician.  Recommendations may be updated based on patient status, additional functional criteria and insurance authorization. Assessment: Orofacial Exam: Orofacial Exam Oral Cavity: Oral Hygiene: WFL Oral Cavity - Dentition: Missing dentition Orofacial Anatomy: WFL Oral Motor/Sensory Function: -- (unable to assess d/t cognitive deficits) Anatomy: Anatomy:  WFL Boluses Administered: Boluses Administered Boluses Administered: Thin liquids (Level 0); Mildly thick liquids (Level 2, nectar thick); Moderately thick liquids (Level 3, honey thick); Puree  Oral Impairment Domain: Oral Impairment Domain Lip Closure: No labial escape Tongue control during bolus hold: -- (unable to complete d/t impaired cognition) Bolus transport/lingual motion: Brisk tongue motion Oral residue: Trace residue lining oral structures Location of oral residue : Tongue; Palate Initiation of pharyngeal swallow : Valleculae  Pharyngeal Impairment Domain: Pharyngeal Impairment Domain Soft palate elevation: No bolus between soft palate (SP)/pharyngeal wall (PW) Laryngeal elevation: Partial superior movement of thyroid  cartilage/partial approximation of arytenoids to epiglottic petiole Anterior hyoid excursion: Partial anterior movement Epiglottic movement: Partial inversion Laryngeal vestibule closure: Incomplete, narrow column air/contrast in laryngeal vestibule Pharyngeal stripping wave : Present - diminished Pharyngeal contraction (A/P view only): N/A Pharyngoesophageal segment opening: Complete distension and complete duration, no obstruction of flow Tongue base retraction: Narrow column of contrast or air between tongue base and PPW Pharyngeal residue: Collection of residue within or on pharyngeal structures Location of pharyngeal residue: Valleculae  Esophageal Impairment Domain: No data recorded Pill: No data recorded Penetration/Aspiration Scale Score: Penetration/Aspiration Scale Score 1.  Material does not enter airway: Puree 8.  Material enters airway, passes BELOW cords without attempt by patient to eject out (silent aspiration) : Thin liquids (Level 0); Mildly thick liquids (Level 2, nectar thick); Moderately thick liquids (Level 3, honey thick) Compensatory Strategies: Compensatory Strategies Compensatory strategies: No   General Information: Caregiver present: No  Diet Prior to this Study:  NPO   Temperature : Normal   Respiratory Status: WFL   Supplemental O2: None (Room air)   History of Recent Intubation: No  Behavior/Cognition: Alert; Doesn't follow directions Self-Feeding Abilities: Dependent for feeding Baseline vocal quality/speech: Hypophonia/low volume Volitional Cough: Unable to elicit Volitional Swallow: Unable to elicit Exam Limitations: Poor positioning; Limited visibility Goal Planning: Prognosis for improved oropharyngeal function: Fair Barriers to Reach Goals: Cognitive deficits; Severity of deficits No data recorded Patient/Family Stated Goal: continue to progress Consulted and agree with results and recommendations: Patient Pain: Pain Assessment Pain Assessment: No/denies pain Facial Expression: 0 Body Movements: 0 Muscle Tension: 0 Compliance with ventilator (intubated pts.): N/A Vocalization (extubated pts.): 0 CPOT Total: 0 Pain Intervention(s): Limited activity within patient's tolerance; Monitored during session; Repositioned End of Session: Start Time:SLP Start Time (ACUTE ONLY): 1159 Stop Time: SLP Stop Time (ACUTE ONLY): 1214 Time Calculation:SLP Time Calculation (min) (ACUTE ONLY): 15 min Charges: SLP Evaluations $  SLP Speech Visit: 1 Visit SLP Evaluations $MBS Swallow: 1 Procedure $Swallowing Treatment: 1 Procedure SLP visit diagnosis: SLP Visit Diagnosis: Dysphagia, pharyngeal phase (R13.13) Past Medical History: Past Medical History: Diagnosis Date  Allergy   Alopecia   Anal cancer (HCC) 08/14/2013  invasive squamous cell ca, s/p radiation 10/20-11/26/14 60.4Gy/52fx and chemo  Anxiety   Aortoiliac occlusive disease (HCC) 08/14/2017  Arthritis   B12 deficiency anemia 09/14/2015  Blood transfusion without reported diagnosis   BPPV (benign paroxysmal positional vertigo) 07/08/2015  Cardiomyopathy (HCC) 11/03/2017  Chronic back pain   Chronic daily headache 03/29/2013  takes bc powder  Closed right hip fracture (HCC) 09/10/2015  Coronary artery disease involving native  coronary artery of native heart without angina pectoris 10/13/2017  DES to mid RCA  Depression   Essential (hemorrhagic) thrombocythemia (HCC) 09/06/2018  Essential hypertension 09/25/2023  Family history of early CAD 04/08/2016  GERD (gastroesophageal reflux disease)   History of hiatal hernia   Hot flashes   Hyperlipidemia   Hyperlipidemia associated with type 2 diabetes mellitus (HCC) 05/11/2017  Hypothyroidism 09/10/2015  IBS (irritable bowel syndrome) 03/29/2013  Neck pain 07/08/2015  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  takes neurotin  Peripheral vascular disease 11/03/2017  QT prolongation   Sacroiliitis 09/06/2018  Spasms of the hands or feet 10/08/2017  Syncope 06/07/2016  Tubular adenoma of colon 09/08/2003  Vertigo   Wears glasses  Past Surgical History: Past Surgical History: Procedure Laterality Date  ABDOMINAL AORTOGRAM N/A 08/02/2017  Procedure: ABDOMINAL AORTOGRAM;  Surgeon: Darron Deatrice LABOR, MD;  [facility];  Service: Cardiovascular;  Laterality: N/A;  ABDOMINAL AORTOGRAM W/LOWER EXTREMITY Bilateral 09/03/2024  Procedure: ABDOMINAL AORTOGRAM W/LOWER EXTREMITY;  Surgeon: Serene Gaile ORN, MD;  [facility];  Service: Cardiovascular;  Laterality: Bilateral;  AORTA - BILATERAL FEMORAL ARTERY BYPASS GRAFT N/A 08/14/2017  Procedure: AORTA BIFEMORAL BYPASS GRAFT;  Surgeon: Oris Krystal FALCON, MD;  Location: [facility] OR;  Service: Vascular;  Laterality: N/A;  COLONOSCOPY    COLONOSCOPY N/A 08/29/2024  Procedure: COLONOSCOPY;  Surgeon: Wilhelmenia Aloha Raddle., MD;  [facility];  Service: Gastroenterology;  Laterality: N/A;  CORONARY STENT INTERVENTION N/A 10/13/2017  Procedure: CORONARY STENT INTERVENTION;  Surgeon: Mady Bruckner, MD;  [facility];  Service: Cardiovascular;  Laterality: N/A;  dental implant    ECTOPIC PREGNANCY SURGERY    EMBOLECTOMY N/A 08/14/2017  Procedure: EMBOLECTOMY FEMORAL;  Surgeon: Oris Krystal FALCON, MD;  Location: [facility] OR;  Service: Vascular;   Laterality: N/A;  FEMORAL-POPLITEAL BYPASS GRAFT Right 08/14/2017  Procedure: Right Femoral to Above Knee Popliteal Bypass Graft using Non-Reversed Greater Saphenous Vein Graft from Right Leg;  Surgeon: Oris Krystal FALCON, MD;  Location: [facility] OR;  Service: Vascular;  Laterality: Right;  FLEXIBLE SIGMOIDOSCOPY N/A 08/14/2013  Procedure: FLEXIBLE SIGMOIDOSCOPY;  Surgeon: Gwendlyn ONEIDA Buddy, MD;  [facility];  Service: Endoscopy;  Laterality: N/A;  LAPAROTOMY N/A 10/24/2024  Procedure: EXPLORATORY LAPAROTOMY, ABDOMINAL PACKING AND WOUND VAC APPLICATION;  Surgeon: Debby Hila, MD;  Location: [facility];  Service: General;  Laterality: N/A;  (8) EIGHT 18X18 LAP SPONGE PACKED  LAPAROTOMY N/A 10/25/2024  Procedure: LAPAROTOMY, EXPLORATORY;  Surgeon: Rubin Calamity, MD;  Location: [facility];  Service: General;  Laterality: N/A;  REMOVAL OF PACKING  LEFT HEART CATH AND CORONARY ANGIOGRAPHY N/A 10/13/2017  Procedure: LEFT HEART CATH AND CORONARY ANGIOGRAPHY;  Surgeon: Mady Bruckner, MD;  [facility];  Service: Cardiovascular;  Laterality: N/A;  LOWER EXTREMITY ANGIOGRAPHY Bilateral 08/02/2017  Procedure: Lower Extremity Angiography;  Surgeon: Darron Deatrice LABOR, MD;  Location: [facility] INVASIVE CV LAB;  Service: Cardiovascular;  Laterality: Bilateral;  LOWER EXTREMITY INTERVENTION N/A 09/03/2024  Procedure: LOWER EXTREMITY INTERVENTION;  Surgeon: Serene Gaile ORN, MD;  [facility];  Service: Cardiovascular;  Laterality: N/A;  MULTIPLE TOOTH EXTRACTIONS    PERIPHERAL VASCULAR INTERVENTION  05/22/2018  Procedure: PERIPHERAL VASCULAR INTERVENTION;  Surgeon: Serene Gaile ORN, MD;  [facility];  Service: Cardiovascular;;  SMA and Celiac  PILONIDAL CYST EXCISION    POLYPECTOMY    THROMBECTOMY FEMORAL ARTERY Right 08/14/2017  Procedure: THROMBECTOMY FEMORAL ARTERY;  Surgeon: Oris Krystal FALCON, MD;  Location: [facility] OR;  Service: Vascular;  Laterality: Right;  TONSILLECTOMY    TOTAL HIP ARTHROPLASTY   09/11/2015  Procedure: TOTAL HIP ARTHROPLASTY;  Surgeon: Evalene JONETTA Chancy, MD;  [facility];  Service: Orthopedics;;  ULTRASOUND GUIDANCE FOR VASCULAR ACCESS  10/13/2017  Procedure: Ultrasound Guidance For Vascular Access;  Surgeon: Mady Bruckner, MD;  [facility];  Service: Cardiovascular;;  VENTRAL HERNIA REPAIR N/A 10/23/2024  Procedure: REPAIR, HERNIA, VENTRAL, LAPAROSCOPIC;  Surgeon: Rubin Calamity, MD;  Location: [facility];  Service: General;  Laterality: N/A;  VISCERAL ANGIOGRAPHY N/A 03/03/2020  Procedure: MESTENRIC ANGIOGRAPHY;  Surgeon: Serene Gaile ORN, MD;  [facility];  Service: Cardiovascular;  Laterality: N/A;  VISCERAL ANGIOGRAPHY N/A 09/03/2024  Procedure: VISCERAL ANGIOGRAPHY;  Surgeon: Serene Gaile ORN, MD;  [facility];  Service: Cardiovascular;  Laterality: N/A;  VISCERAL ARTERY INTERVENTION N/A 09/03/2024  Procedure: VISCERAL ARTERY INTERVENTION;  Surgeon: Serene Gaile ORN, MD;  [facility];  Service: Cardiovascular;  Laterality: N/A; Rosina LABOR Ellin 11/06/2024, 2:59 PM  VAS US  LOWER EXTREMITY VENOUS (DVT) Result Date: 11/01/2024  Lower Venous DVT Study Patient Name:  SCOTTY PINDER  Date of Exam:   11/01/2024 Medical Rec #: [id number]          Accession #:    [id number] Date of Birth: 01-25-1956          Patient Gender: F Patient Age:   35 years Exam Location:  [facility] Procedure:      VAS US  LOWER EXTREMITY VENOUS (DVT) Referring Phys: TORIBIO SHARPS --------------------------------------------------------------------------------  Indications: Edema.  Risk Factors: Critically ill patient - immobilization Surgery laparoscopic hernia repair 10/23/2024, exploratory laparotomy 10/24/2024 & 10/25/2024. Limitations: Poor ultrasound/tissue interface and shadowing. Comparison Study: Previous exam on 11/28/2013 was negative for DVT Performing Technologist: Ezzie Potters RVT, RDMS  Examination Guidelines: A complete evaluation  includes B-mode imaging, spectral Doppler, color Doppler, and power Doppler as needed of all accessible portions of each vessel. Bilateral testing is considered an integral part of a complete examination. Limited examinations for reoccurring indications may be performed as noted. The reflux portion of the exam is performed with the patient in reverse Trendelenburg.  +---------+---------------+---------+-----------+----------+-------------------+ RIGHT    CompressibilityPhasicitySpontaneityPropertiesThrombus Aging      +---------+---------------+---------+-----------+----------+-------------------+ CFV      Full           Yes      Yes                                      +---------+---------------+---------+-----------+----------+-------------------+ SFJ  unable to visualize +---------+---------------+---------+-----------+----------+-------------------+ FV Prox  Full           Yes      Yes                                      +---------+---------------+---------+-----------+----------+-------------------+ FV Mid   Full           Yes      Yes                                      +---------+---------------+---------+-----------+----------+-------------------+ FV DistalFull           Yes      Yes                                      +---------+---------------+---------+-----------+----------+-------------------+ PFV      Full                                                             +---------+---------------+---------+-----------+----------+-------------------+ POP      Full           Yes      Yes                                      +---------+---------------+---------+-----------+----------+-------------------+ PTV      Full                                                             +---------+---------------+---------+-----------+----------+-------------------+ PERO     Full                                                              +---------+---------------+---------+-----------+----------+-------------------+   +---------+---------------+---------+-----------+----------+-------------------+ LEFT     CompressibilityPhasicitySpontaneityPropertiesThrombus Aging      +---------+---------------+---------+-----------+----------+-------------------+ CFV      Full           Yes      Yes                                      +---------+---------------+---------+-----------+----------+-------------------+ SFJ                                                   unable to visualize +---------+---------------+---------+-----------+----------+-------------------+ FV Prox  Full           Yes      Yes                                      +---------+---------------+---------+-----------+----------+-------------------+  FV Mid   Full           Yes      Yes                                      +---------+---------------+---------+-----------+----------+-------------------+ FV DistalFull           Yes      Yes                                      +---------+---------------+---------+-----------+----------+-------------------+ PFV      Full                                                             +---------+---------------+---------+-----------+----------+-------------------+ POP      Full           Yes      Yes                                      +---------+---------------+---------+-----------+----------+-------------------+ PTV      Full                                                             +---------+---------------+---------+-----------+----------+-------------------+ PERO     Full                                                             +---------+---------------+---------+-----------+----------+-------------------+     Summary: BILATERAL: - No evidence of deep vein thrombosis seen in the lower extremities, bilaterally. - RIGHT: - A cystic  structure is found in the popliteal fossa (4.47 x 0.72 x 2.74 cm).  LEFT: - No cystic structure found in the popliteal fossa.  *See table(s) above for measurements and observations. Electronically signed by Gaile New MD on 11/01/2024 at 9:01:20 PM.    Final    DG Chest Port 1 View Result Date: 11/01/2024 CLINICAL DATA:  Shortness of breath EXAM: PORTABLE CHEST 1 VIEW COMPARISON:  Six days ago FINDINGS: The heart size and mediastinal contours are within normal limits. Both lungs are clear. The visualized skeletal structures are unremarkable. Nasogastric tube tip seen in proximal stomach. IMPRESSION: No active disease. Electronically Signed   By: Lynwood Landy Raddle M.D.   On: 11/01/2024 08:40   VAS US  CAROTID Result Date: 10/31/2024 Carotid Arterial Duplex Study Patient Name:  Erin [name]  Date of Exam:   10/29/2024 Medical Rec #: [id number]          Accession #:    [id number] Date of Birth: 03-Jan-1956          Patient Gender: F Patient Age:   89 years Exam Location:  [facility]  Procedure:      VAS US  CAROTID Referring Phys: ELIGIO LAV --------------------------------------------------------------------------------  Indications:   CVA. Risk Factors:  Hypertension, hyperlipidemia, coronary artery disease, PAD. Other Factors: Status post hernia repair surgery on 10/23/24                S/P aortobifem bypass, instent SMA and Celiac. Limitations    Today's exam was limited due to Left neck cathether/tape. Performing Technologist: Ricka Sturdivant-Jones RDMS, RVT  Examination Guidelines: A complete evaluation includes B-mode imaging, spectral Doppler, color Doppler, and power Doppler as needed of all accessible portions of each vessel. Bilateral testing is considered an integral part of a complete examination. Limited examinations for reoccurring indications may be performed as noted.  Right Carotid Findings: +----------+--------+--------+--------+------------------+--------+           PSV cm/sEDV  cm/sStenosisPlaque DescriptionComments +----------+--------+--------+--------+------------------+--------+ CCA Prox  118     18                                         +----------+--------+--------+--------+------------------+--------+ CCA Distal100     16                                         +----------+--------+--------+--------+------------------+--------+ ICA Prox  179     41      40-59%  heterogenous               +----------+--------+--------+--------+------------------+--------+ ICA Mid   161     32      1-39%                              +----------+--------+--------+--------+------------------+--------+ ICA Distal112     28                                         +----------+--------+--------+--------+------------------+--------+ ECA       139                                                +----------+--------+--------+--------+------------------+--------+ +----------+--------+-------+----------------+-------------------+           PSV cm/sEDV cmsDescribe        Arm Pressure (mmHG) +----------+--------+-------+----------------+-------------------+ Subclavian172            Multiphasic, WNL                    +----------+--------+-------+----------------+-------------------+ +---------+--------+---+--------+--+---------+ VertebralPSV cm/s106EDV cm/s26Antegrade +---------+--------+---+--------+--+---------+  Left Carotid Findings: +----------+--------+--------+--------+------------------+--------+           PSV cm/sEDV cm/sStenosisPlaque DescriptionComments +----------+--------+--------+--------+------------------+--------+ CCA Prox  84      17                                         +----------+--------+--------+--------+------------------+--------+ CCA Distal103     17                                         +----------+--------+--------+--------+------------------+--------+  ICA Prox  114     19      1-39%                               +----------+--------+--------+--------+------------------+--------+ ICA Distal86      23                                         +----------+--------+--------+--------+------------------+--------+ ECA       105                                                +----------+--------+--------+--------+------------------+--------+ +----------+--------+--------+--------+-------------------+           PSV cm/sEDV cm/sDescribeArm Pressure (mmHG) +----------+--------+--------+--------+-------------------+ Subclavian260                                         +----------+--------+--------+--------+-------------------+ +---------+--------+--+--------+--+ VertebralPSV cm/s95EDV cm/s19 +---------+--------+--+--------+--+   Summary: Right Carotid: Velocities in the right ICA are consistent with a 40-59%                stenosis. Left Carotid: Velocities in the left ICA are consistent with a 1-39% stenosis. Vertebrals:  Bilateral vertebral arteries demonstrate antegrade flow. Subclavians: Normal flow hemodynamics were seen in bilateral subclavian              arteries. *See table(s) above for measurements and observations.  Electronically signed by Eather Popp MD on 10/31/2024 at 8:04:20 AM.    Final    MR ANGIO HEAD WO CONTRAST Result Date: 10/28/2024 EXAM: MR Angiography Head without intravenous Contrast. 10/28/2024 05:58:31 PM TECHNIQUE: Magnetic resonance angiography images of the head without intravenous contrast. Multiplanar 2D and 3D reformatted images are provided for review. COMPARISON: Same day CT head and MRI head. CLINICAL HISTORY: Stroke, follow up. FINDINGS: ANTERIOR CIRCULATION: The visualized distal cervical internal carotid arteries and intracranial internal carotid arteries are patent bilaterally. There is a 2 mm inferiorly directed outpouching along the right supraclinoid ICA which likely reflects an infundibulum at the origin of the posterior communicating  artery versus a small aneurysm. No significant stenosis of the anterior cerebral arteries. No significant stenosis of the middle cerebral arteries. No other aneurysm is identified in the anterior circulation. POSTERIOR CIRCULATION: No significant stenosis of the posterior cerebral arteries. No significant stenosis of the basilar artery. No significant stenosis of the vertebral arteries. No aneurysm. IMPRESSION: 1. No large vessel occlusion. No significant intracranial arterial stenosis. 2. 2 mm inferiorly directed outpouching along the right supraclinoid ICA, most consistent with an infundibulum at the posterior communicating artery origin. A small aneurysm is less likely but not excluded. Electronically signed by: Donnice Mania MD 10/28/2024 06:40 PM EST RP Workstation: [id number]   MR BRAIN WO CONTRAST Result Date: 10/28/2024 EXAM: MRI BRAIN WITHOUT CONTRAST 10/28/2024 05:58:31 PM TECHNIQUE: Multiplanar multisequence MRI of the head/brain was performed without the administration of intravenous contrast. COMPARISON: Same day CT head. CLINICAL HISTORY: Stroke, follow up. FINDINGS: BRAIN AND VENTRICLES: Numerous scattered areas of restricted diffusion throughout the cerebral hemispheres and cerebellum concerning for infarcts. Dominant lesions noted within the inferior left cerebellum on series 5 image 9 as well as  within the inferior aspects of the temporal lobes and right occipital lobe on series 5 image 20. There are lesions throughout multiple vascular territories bilaterally. Associated edema particularly within the right occipital lobe and left temporal lobe with local mass effect and sulcal effacement. There are few scattered areas of susceptibility and T1 hyperintensity which could reflect petechial hemorrhage. Cerebral volume within normal limits for patients age. No hydrocephalus. The sella is unremarkable. Normal flow voids. ORBITS: No acute abnormality. SINUSES AND MASTOIDS: Trace bilateral mastoid  effusions. Scattered mucosal thickening in the ethmoid and sphenoid sinuses. BONES AND SOFT TISSUES: Normal marrow signal. Partially visualized enteric tube. No acute soft tissue abnormality. IMPRESSION: 1. 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 2. Associated mild edema, particularly within the right occipital lobe and left temporal lobe. No midline shift. 3. Few scattered areas of susceptibility and T1 hyperintensity, possibly reflecting petechial hemorrhage. Electronically signed by: Donnice Mania MD 10/28/2024 06:22 PM EST RP Workstation: [id number]   ECHOCARDIOGRAM COMPLETE Result Date: 10/28/2024    ECHOCARDIOGRAM REPORT   Patient Name:   SRI CLEGG Date of Exam: 10/28/2024 Medical Rec #:  [id number]         Height:       63.0 in Accession #:    [id number]        Weight:       118.8 lb Date of Birth:  18-Mar-1956         BSA:          1.550 m Patient Age:    68 years          BP:           106/37 mmHg Patient Gender: F                 HR:           60 bpm. Exam Location:  Inpatient Procedure: 2D Echo (Both Spectral and Color Flow Doppler were utilized during            procedure). Indications:    Stroke  History:        Patient has prior history of Echocardiogram examinations.                 Stroke.  Sonographer:    Norleen Amour Referring Phys: [id number] ASHISH ARORA IMPRESSIONS  1. Left ventricular ejection fraction, by estimation, is 60 to 65%. Left ventricular ejection fraction by 2D MOD biplane is 64.6 %. The left ventricle has normal function. The left ventricle has no regional wall motion abnormalities. Left ventricular diastolic parameters were normal.  2. Right ventricular systolic function is normal. The right ventricular size is normal. There is normal pulmonary artery systolic pressure. The estimated right ventricular systolic pressure is 24.9 mmHg.  3. The mitral valve was not well  visualized. No evidence of mitral valve regurgitation.  4. The aortic valve was not well visualized. Aortic valve regurgitation is not visualized.  5. The inferior vena cava is normal in size with greater than 50% respiratory variability, suggesting right atrial pressure of 3 mmHg. Comparison(s): No prior Echocardiogram. FINDINGS  Left Ventricle: Left ventricular ejection fraction, by estimation, is 60 to 65%. Left ventricular ejection fraction by 2D MOD biplane is 64.6 %. The left ventricle has normal function. The left ventricle has no regional wall motion abnormalities. The left ventricular internal cavity size was normal in size. There is no left ventricular hypertrophy. Left  ventricular diastolic parameters were normal. Right Ventricle: The right ventricular size is normal. No increase in right ventricular wall thickness. Right ventricular systolic function is normal. There is normal pulmonary artery systolic pressure. The tricuspid regurgitant velocity is 2.34 m/s, and  with an assumed right atrial pressure of 3 mmHg, the estimated right ventricular systolic pressure is 24.9 mmHg. Left Atrium: Left atrial size was normal in size. Right Atrium: Right atrial size was normal in size. Pericardium: There is no evidence of pericardial effusion. Mitral Valve: The mitral valve was not well visualized. No evidence of mitral valve regurgitation. MV peak gradient, 3.6 mmHg. The mean mitral valve gradient is 1.0 mmHg. Tricuspid Valve: The tricuspid valve is grossly normal. Tricuspid valve regurgitation is trivial. Aortic Valve: The aortic valve was not well visualized. Aortic valve regurgitation is not visualized. Aortic valve mean gradient measures 4.0 mmHg. Aortic valve peak gradient measures 8.2 mmHg. Aortic valve area, by VTI measures 1.54 cm. Pulmonic Valve: The pulmonic valve was normal in structure. Pulmonic valve regurgitation is not visualized. Aorta: The aortic root and ascending aorta are structurally normal,  with no evidence of dilitation. Venous: The inferior vena cava is normal in size with greater than 50% respiratory variability, suggesting right atrial pressure of 3 mmHg. IAS/Shunts: No atrial level shunt detected by color flow Doppler.  LEFT VENTRICLE PLAX 2D                        Biplane EF (MOD) LVIDd:         4.30 cm         LV Biplane EF:   Left LVIDs:         2.50 cm                          ventricular LV PW:         0.80 cm                          ejection LV IVS:        0.70 cm                          fraction by LVOT diam:     1.80 cm                          2D MOD LV SV:         39                               biplane is LV SV Index:   25                               64.6 %. LVOT Area:     2.54 cm                                Diastology                                LV e' medial:    7.18 cm/s LV Volumes (MOD)  LV E/e' medial:  14.2 LV vol d, MOD    40.2 ml       LV e' lateral:   9.14 cm/s A2C:                           LV E/e' lateral: 11.2 LV vol d, MOD    47.1 ml A4C: LV vol s, MOD    11.8 ml A2C: LV vol s, MOD    21.0 ml A4C: LV SV MOD A2C:   28.4 ml LV SV MOD A4C:   47.1 ml LV SV MOD BP:    28.9 ml RIGHT VENTRICLE             IVC RV S prime:     11.00 cm/s  IVC diam: 1.20 cm TAPSE (M-mode): 2.2 cm                             PULMONARY VEINS                             Diastolic Velocity: 30.80 cm/s                             S/D Velocity:       1.00                             Systolic Velocity:  30.80 cm/s LEFT ATRIUM             Index        RIGHT ATRIUM          Index LA diam:        2.50 cm 1.61 cm/m   RA Area:     6.54 cm LA Vol (A2C):   24.2 ml 15.61 ml/m  RA Volume:   11.10 ml 7.16 ml/m LA Vol (A4C):   24.0 ml 15.49 ml/m LA Biplane Vol: 24.8 ml 16.00 ml/m  AORTIC VALVE                    PULMONIC VALVE AV Area (Vmax):    1.50 cm     PV Vmax:       0.87 m/s AV Area (Vmean):   1.55 cm     PV Peak grad:  3.0 mmHg AV Area (VTI):     1.54 cm AV Vmax:            143.00 cm/s AV Vmean:          94.300 cm/s AV VTI:            0.256 m AV Peak Grad:      8.2 mmHg AV Mean Grad:      4.0 mmHg LVOT Vmax:         84.20 cm/s LVOT Vmean:        57.300 cm/s LVOT VTI:          0.155 m LVOT/AV VTI ratio: 0.61  AORTA Ao Root diam: 2.30 cm Ao Asc diam:  2.60 cm MITRAL VALVE                TRICUSPID VALVE MV Area (PHT): 3.89 cm     TR Peak grad:   21.9 mmHg MV Area VTI:   1.37 cm  TR Vmax:        234.00 cm/s MV Peak grad:  3.6 mmHg MV Mean grad:  1.0 mmHg     SHUNTS MV Vmax:       0.95 m/s     Systemic VTI:  0.16 m MV Vmean:      56.4 cm/s    Systemic Diam: 1.80 cm MV Decel Time: 195 msec MV E velocity: 102.00 cm/s MV A velocity: 61.30 cm/s MV E/A ratio:  1.66 Vinie Maxcy MD Electronically signed by Vinie Maxcy MD Signature Date/Time: 10/28/2024/3:58:07 PM    Final    EEG adult Result Date: 10/28/2024 Shelton Arlin KIDD, MD     10/28/2024  2:17 PM Patient Name: ADINA PUZZO MRN: [id number] Epilepsy Attending: Arlin KIDD Shelton Referring Physician/Provider: Ilah Corean [name], PA-C Date: 10/28/2024 Duration: 24.28 mins Patient history: 68yo F with ams. EEG to evaluate for seizure Level of alertness: comatose/lethargic AEDs during EEG study: None Technical aspects: This EEG study was done with scalp electrodes positioned according to the 10-20 International system of electrode placement. Electrical activity was reviewed with band pass filter of 1-70Hz , sensitivity of 7 uV/mm, display speed of 62mm/sec with a 60Hz  notched filter applied as appropriate. EEG data were recorded continuously and digitally stored.  Video monitoring was available and reviewed as appropriate. Description: EEG showed continuous generalized polymorphic high amplitude 3 to 5 Hz theta-delta slowing. Hyperventilation and photic stimulation were not performed.   ABNORMALITY - Continuous slow, generalized IMPRESSION: This study is suggestive of generalized cerebral dysfunction ( encephalopathy). No seizures or  epileptiform discharges were seen throughout the recording. Priyanka KIDD Shelton   CT HEAD WO CONTRAST ( ) Result Date: 10/28/2024 EXAM: CT HEAD WITHOUT CONTRAST 10/28/2024 10:26:05 AM TECHNIQUE: CT of the head was performed without the administration of intravenous contrast. Automated exposure control, iterative reconstruction, and/or weight based adjustment of the mA/kV was utilized to reduce the radiation dose to as low as reasonably achievable. COMPARISON: 05/03/2024 CLINICAL HISTORY: Mental status change, unknown cause FINDINGS: BRAIN AND VENTRICLES: No acute hemorrhage. No evidence of acute infarct. No hydrocephalus. No extra-axial collection. No mass effect or midline shift. Interval development of multiple cortical and subcortical edema areas involving bilateral cerebral and cerebellar hemispheres. ORBITS: No acute abnormality. SINUSES: Left frontal sinus mucosal thickening. SOFT TISSUES AND SKULL: No acute soft tissue abnormality. No skull fracture. Partially visualized nasogastric tube in place. IMPRESSION: 1. Interval development of multiple cortical and subcortical edema areas involving bilateral cerebral and cerebellar hemispheres. These areas are highly suggestive of embolic infarcts. Correlation with MRI of the brain without and with gadolinium contrast is suggested. Electronically signed by: Evalene Coho MD 10/28/2024 10:56 AM EST RP Workstation: [id number]   DG Abd 1 View Result Date: 10/26/2024 EXAM: 1 VIEW XRAY OF THE ABDOMEN 10/26/2024 10:18:00 PM COMPARISON: 10/24/2024 CLINICAL HISTORY: [id number] Encounter for imaging study to confirm nasogastric (NG) tube placement [id number] Encounter for imaging study to confirm nasogastric (NG) tube placement FINDINGS: LINES, TUBES AND DEVICES: NG tube coils in the fundus of the stomach. BOWEL: Nonobstructive bowel gas pattern. SOFT TISSUES: Skin staples noted to the right of midline. No abnormal calcifications. BONES: No acute fracture. IMPRESSION: 1. NG  tube tip projects over the fundus of the stomach. 2. Nonobstructive bowel gas pattern. Electronically signed by: Franky Crease MD 10/26/2024 10:23 PM EST RP Workstation: [id number]   DG Chest Port 1 View Result Date: 10/26/2024 EXAM: 1 VIEW(S) XRAY OF THE CHEST 10/26/2024 08:29:42 PM COMPARISON: 10/25/2024 CLINICAL HISTORY: Respiratory failure (HCC)  FINDINGS: LINES, TUBES AND DEVICES: Endotracheal tube in place with tip 1 cm above the carina. Enteric tube coiled within the gastric fundus. Stable left IJ approach central venous catheter with tip at the superior cavoatrial junction. LUNGS AND PLEURA: Small right pleural effusion with right basilar opacities, not significantly changed. No pneumothorax. HEART AND MEDIASTINUM: Atherosclerotic plaque. No acute abnormality of the cardiac and mediastinal silhouettes. BONES AND SOFT TISSUES: Upper abdominal surgical staples noted. No acute osseous abnormality. IMPRESSION: 1. Small right pleural effusion with persistent right basilar opacities, unchanged. 2. Enteric tube coiled within the stomach. Consider retracting by 3cm. Electronically signed by: Morgane Naveau MD 10/26/2024 08:34 PM EST RP Workstation: [id number]   DG CHEST PORT 1 VIEW Result Date: 10/25/2024 EXAM: 1 VIEW(S) XRAY OF THE CHEST 10/25/2024 10:46:00 AM COMPARISON: Yesterday. CLINICAL HISTORY: [id number] History of endotracheal intubation [id number] History of endotracheal intubation FINDINGS: LINES, TUBES AND DEVICES: Endotracheal and nasogastric tubes are appropriately in good position. Left internal jugular catheter is unchanged. LUNGS AND PLEURA: Mild right basilar atelectasis is noted. Probable small pleural effusion. No pneumothorax. HEART AND MEDIASTINUM: No acute abnormality of the cardiac and mediastinal silhouettes. BONES AND SOFT TISSUES: No acute osseous abnormality. IMPRESSION: 1. Mild right basilar atelectasis with probable small pleural effusion. Electronically signed by: Lynwood Seip MD  10/25/2024 11:21 AM EST RP Workstation: [id number]   DG Abd Portable 1V Result Date: 10/24/2024 CLINICAL DATA:  Nasogastric tube advancement. EXAM: PORTABLE ABDOMEN - 1 VIEW COMPARISON:  CT earlier today FINDINGS: Tip and side port of the enteric tube below the diaphragm in the stomach. Multiple serpiginous densities in the right hemiabdomen with air projecting over the soft tissues likely postoperative in nature. IMPRESSION: Tip and side port of the enteric tube below the diaphragm in the stomach. Electronically Signed   By: Andrea Gasman M.D.   On: 10/24/2024 11:41   DG CHEST PORT 1 VIEW Result Date: 10/24/2024 EXAM: 1 VIEW(S) XRAY OF THE CHEST 10/24/2024 06:04:00 AM COMPARISON: 10/23/2024 CLINICAL HISTORY: Intubation of airway performed without difficulty FINDINGS: LINES, TUBES AND DEVICES: Endotracheal tube in place with tip 2.2 cm above the carina. Enteric tube in place with tip in the region of the gastroesophageal junction. Left internal jugular central venous catheter in place with tip in the superior vena cava. LUNGS AND PLEURA: Small bilateral pleural effusions. Hazy bibasilar airspace opacities, possibly representing atelectasis or pneumonia. Prominent bilateral interstitial opacities, possibly representing mild pulmonary edema or atelectasis. No pneumothorax. HEART AND MEDIASTINUM: No acute abnormality of the cardiac and mediastinal silhouettes. BONES AND SOFT TISSUES: No acute osseous abnormality. Gaseous distention of stomach. IMPRESSION: 1. Enteric tube is under-advanced with tip at the level of the ge junction. Recommend advancement. 2. Small bilateral pleural effusions, new from prior exam. 3. Prominent bilateral interstitial opacities, which are favored to reflect pulmonary edema. 4. New hazy bibasilar airspace opacities, which may reflect atelectasis or pneumonia. Electronically signed by: Waddell Calk MD 10/24/2024 06:21 AM EST RP Workstation: [id number]   CT Angio Abd/Pel w/ and/or  w/o Result Date: 10/24/2024 EXAM: CTA ABDOMEN AND PELVIS WITHOUT AND WITH CONTRAST 10/24/2024 12:45:40 AM TECHNIQUE: CTA images of the abdomen and pelvis without and with intravenous contrast. Three-dimensional MIP/volume rendered formations were performed. Automated exposure control, iterative reconstruction, and/or weight based adjustment of the mA/kV was utilized to reduce the radiation dose to as low as reasonably achievable. COMPARISON: 06/18/2024 CLINICAL HISTORY: Retroperitoneal bleed suspected. Status post abdominal wall hernia repair on 10/23/2024 and recent renal artery stent for stenosis. FINDINGS: VASCULATURE:  Low-density blood pool compatible with anemia. AORTA: No acute finding. No abdominal aortic aneurysm. No dissection. CELIAC TRUNK: The celiac axis stent is patent. SUPERIOR MESENTERIC ARTERY: The SMA stent is patent. There is severe narrowing of the SMA immediately distal to the stent. INFERIOR MESENTERIC ARTERY: The IMA is not well visualized. RENAL ARTERIES: Diminutive right renal artery. Minimal enhancement of the left kidney with non-opacification of the left renal artery which appears occluded near the origin (series 13 image 94). ILIAC ARTERIES: No acute finding. Patent aortobifemoral bypass grafts. LIVER: The liver is unremarkable. GALLBLADDER AND BILE DUCTS: Gallbladder wall thickening is likely reactive and/or due to 3rd spacing of fluid. No biliary ductal dilatation. SPLEEN: The spleen is unremarkable. PANCREAS: The pancreas is unremarkable. ADRENAL GLANDS: Bilateral adrenal glands demonstrate no acute abnormality. KIDNEYS, URETERS AND BLADDER: There is a peripheral wedge-shaped area of non-enhancement in the superior pole of the right kidney compatible with infarct. Minimal enhancement of the left kidney with non-opacification of the left renal artery which appears occluded near the origin (series 13 image 94). No stones in the kidneys or ureters. No hydronephrosis. No perinephric or  periureteral stranding. Foley catheter and gas in the decompressed bladder. GI AND BOWEL: Patulous lower esophagus containing fluid. Distended stomach with air-fluid level. Wall thickening and mucosal hyperenhancement of the gastric antrum and duodenum. Wall thickening and mucosal hyperenhancement of the duodenum, jejunum as well as the cecum and ascending and transverse colon presumably due to reactive inflammation from adjacent hemorrhage. REPRODUCTIVE: No acute abnormality. PERITONEUM AND RETROPERITONEUM: Large right intraperitoneal hematoma measuring 14.9 x 8.0 x 13.2 cm. There is active bleeding from the right epigastric artery (circa series 7 image 115 and series 9 image 48). Hemorrhage tracks inferiorly into the pelvis. Tiny amount of free intraperitoneal gas in the anterior peritoneum is likely postoperative. LUNG BASE: Bilateral pleural effusions and compressive atelectasis. LYMPH NODES: No lymphadenopathy. BONES AND SOFT TISSUES: Right hip arthroplasty. Extensive subcutaneous edema in the abdominal wall. Hemorrhage extends through a defect in the anterior peritoneum into the subcutaneous fat of the ventral abdominal wall (series 7, images 107-113). Subcutaneous emphysema is presumed postoperative. No acute abnormality of the bones. IMPRESSION: 1. Large right intraperitoneal hematoma with active bleeding from the right epigastric artery. Large volume hemorrhage throughout the abdomen and pelvis. 2. 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 3. Diminutive right renal artery with infarct in the upper pole of the right kidney. 4. Severe narrowing of the SMA immediately distal to the stent, which is patent. 5. Reactive inflammation of the small bowel colon. 6. Low-density blood pool on noncontrast images compatible with anemia. 7. Body wall anasarca. Critical value/emergent results were called by telephone at the time of interpretation on 12 / 4 / 25 at 1:10 pm to  Dr. DOROTHA Na, who verbally acknowledged these results. Electronically signed by: Norman Gatlin MD 10/24/2024 01:13 AM EST RP Workstation: [id number]   DG CHEST PORT 1 VIEW Result Date: 10/23/2024 EXAM: 1 VIEW(S) XRAY OF THE CHEST 10/23/2024 10:50:52 PM COMPARISON: None available. CLINICAL HISTORY: Encounter for central line placement [id number] FINDINGS: LINES, TUBES AND DEVICES: Left internal jugular central venous catheter in place with tip overlying the superior caval atrial junction. LUNGS AND PLEURA: Benign calcified granuloma in right base. No pleural effusion. No pneumothorax. HEART AND MEDIASTINUM: Calcified aorta. No acute abnormality of the cardiac silhouette. BONES AND SOFT TISSUES: No acute osseous abnormality. UPPER ABDOMEN: Gaseous distention of stomach. IMPRESSION: 1. Left internal jugular  central venous catheter in place with tip overlying the superior caval atrial junction. Electronically signed by: Norman Gatlin MD 10/23/2024 10:59 PM EST RP Workstation: [id number]    Labs:  CBC: Recent Labs    11/15/24 0525 11/16/24 0539 11/17/24 0550 11/18/24 0429  WBC 6.6 6.2 6.3 6.8  HGB 9.1* 9.0* 8.8* 8.6*  HCT 27.1* 27.4* 27.0* 25.7*  PLT 433* 343 337 316    COAGS: Recent Labs    10/24/24 0610 10/24/24 1500 10/24/24 2151 10/25/24 0507 10/29/24 0530 11/15/24 0525  INR 1.4* 1.2 1.3* 1.3* 1.1 1.0  APTT 35 32 34 36  --   --     BMP: Recent Labs    11/15/24 0525 11/16/24 0539 11/17/24 0550 11/18/24 0429  NA 134* 134* 136 136  K 4.0 4.0 4.3 4.1  CL 98 99 101 100  CO2 24 23 24 24   GLUCOSE 114* 117* 104* 113*  BUN 56* 57* 55* 57*  CALCIUM  9.2 9.4 9.5 9.5  CREATININE 1.57* 1.64* 1.49* 1.58*  GFRNONAA 36* 34* 38* 35*    LIVER FUNCTION TESTS: Recent Labs    11/10/24 0408 11/11/24 0334 11/12/24 0327 11/13/24 0155  BILITOT <0.2 0.2 0.3 0.2  AST 17 18 16 22   ALT 10 12 10 13   ALKPHOS 83 83 88 91  PROT 5.7* 5.9* 6.1* 6.3*  ALBUMIN  3.2* 3.4* 3.5 3.6    TUMOR  MARKERS: No results for input(s): AFPTM, CEA, CA199, CHROMGRNA in the last 8760 hours.  Assessment and Plan: Dysphagia-  Request for image guided gastrostomy tube. No contraindications for procedure identified in ROS, physical exam, or review of pre-sedation considerations.  Labs reviewed and within acceptable range Imaging available and reviewed VSS, afebrile Will hold 12/30 AM SQ heparin  Abx indicated, Ancef  ordered   Risks and benefits image guided gastrostomy tube placement was discussed with the patient's husband including, but not limited to the need for a barium enema during the procedure, bleeding, infection, peritonitis and/or damage to adjacent structures.  All of the patient's husband's questions were answered, husband is agreeable to proceed.  Consent signed and in chart.    Thank you for allowing our service to participate in ELLIANNE GOWEN 's care.    Electronically Signed: Zeah Germano B Ether Wolters, NP   11/18/2024, 1:12 PM     I spent a total of 20 Minutes in face to face in clinical consultation, greater than 50% of which was counseling/coordinating care for image guided gastrostomy tube placement.   (A copy of this note was sent to the referring provider and the time of visit.)  "

## 2024-11-18 NOTE — Progress Notes (Signed)
 " PROGRESS NOTE    Erin [name]  FMW:[id number] DOB: [birth year] DOA: 10/23/2024 PCP: [name] [name], MD   Brief Narrative:  68 year old woman who presented to [facility] 12/3 for elective hernia repair. 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  Patient admitted initially for postop hernia repair found to have hemoperitoneum with hemorrhagic shock postoperatively requiring intubation and pressors.  Her course was subsequently complicated by bilateral strokes thought to be provoked by hypotension.  At this time patient is stabilizing, she has been extubated, continues tube feeds via NG and is otherwise stable from a surgical standpoint.  Transition out of the ICU to hospitalist team on 12/15 with plan for ongoing care with ultimate plan for disposition pending patient's clinical course.  Hospital course 12/3 admitted to ICU postop 12/3-post hernia repair 12/4 taken back to the OR for hemoperitoneum-active bleed was not identified, abdomen packed with wound VAC in place 12/5 back to the OR for an open abdomen-no evidence of ongoing bleeding and abdomen was closed 12/8 - CT Head with interval development (since 04/2024) of multiple cortical and subcortical hypodensities in bilateral cerebellar and cerebral hemispheres suggestive of acute/subacute infarcts. MRI Brain with numerous areas of restricted diffusion throughout the cerebral hemispheres/cerebellum c/f acute infarcts of embolic etiology, associated mild edema (R occipital lobe, L temporal lobe).  12/11 tolerating PSV 5/5 >> extubated 12/15 transition to TRH team - goals of care discussion with husband(see below) 12/17 General surgery evaluating for PEG -pending SLP re-evaluation in hopes she will be able to  improve -LTAC was denied by insurance  Assessment & Plan:    Hemorrhagic shock from hypovolemia caused during ex lap hernia repair SHOCK - required stay in ICU, pressors, received massive blood transfusion protocol with 5 units of packed RBC and 2 units of cryoprecipitate around 10/23/2024, unfortunately incurred anoxic brain injury/CVA, brief support with intubation.  Shock has resolved, monitor with supportive care.  Acute bilateral cerebral and cerebellar infarcts, causing encephalopathy, aphasia, dysphagia - CT head with multiple cortical and subcortical hypodensities, confirmed by MRI, likely due to profound hypotension from #1 above, seen by neuro, patient remains minimally verbal, has dysphagia requiring NG tube feeds for 3 weeks.  She was being prepped for PEG tube placement however showed considerable improvement in her swallowing on 11/11/2024, seen by speech and started on oral diet, but overall her oral intake remains poor, so even if she improves regarding dysphagia but she will always have an issue of enough calories/nutrition intake, so she will benefit from PEG as I have discussed with husband, so IR has been reengaged to evaluate for PEG.  Aspirin  need to be on hold for 5 days, so plan for PEG is for next Tuesday - Diet was advanced, currently on dysphagia 3, thin liquid, decreased her tube feeding times to 12 hours/days, has been followed closely by nutritionist to see her calorie intake, but overall is unreliable intake, would be sufficient to maintain her recovery, so plan to proceed with PEG tube tomorrow by IR.      HTN  - Continue Amlodipine , Carvedilol , added hydralazine  for better blood pressure control, continue as needed IV hydralazine .  Goal blood pressure is labile but more controlled than not so I will hold on increasing on her scheduled meds.    S/p hernia repair, elective  12/3, S/p ex-lap 12/4, 12/5  - Postoperative management per CCS,  Wound care per surgery   Acute  kidney injury to ATN from shock.  History of left renal artery stenosis.  Renal function improving.  Monitor   Acute respiratory insufficiency , extubated 12/11 - happened due to anoxic brain injury/CVA.  Resolved.  HCAP - Enterobacter 12/6  - Completed antibiotic treatment.  History of peripheral neuropathy and itching.  Usually takes high doses of Neurontin  at home, cut down as she has some encephalopathy on 11/09/2024, reduce sedative medications  Mild metabolic encephalopathy.  See above.  Reduce sedating medications including Neurontin .   DVT prophylaxis: heparin  injection 5,000 Units Start: 10/28/24 2200 SCD's Start: 10/23/24 1918 Code Status:   Code Status: Full Code Family Communication: None at bedside  Status is: Inpt  Dispo: With home health versus SNF when PEG tube is inserted.  Consultants:  PCCM, General surgery, Neuro, IR  Procedures:  Hernia repair 12/3 and ex-lap 12/5   Antimicrobials:  None(completed prior course)   Subjective:   Patient appears to be in good spirits today, reports she had a good night sleep, denies any complaints.     Objective: Vitals:   11/18/24 0312 11/18/24 0535 11/18/24 0756 11/18/24 1231  BP:  (!) 130/55 127/66 (!) 141/60  Pulse: 87  92 80  Resp: 15  15 20   Temp:   (!) 97.4 F (36.3 C)   TempSrc:   Axillary   SpO2: 97%  96% 99%  Weight:      Height:        Intake/Output Summary (Last 24 hours) at 11/18/2024 1255 Last data filed at 11/18/2024 1240 Gross per 24 hour  Intake 1648 ml  Output --  Net 1648 ml   Filed Weights   11/15/24 0424 11/16/24 0500 11/17/24 0500  Weight: 46.3 kg 46.3 kg 45.7 kg    Examination:  Awake, alert, appropriate and communicative, follows commands Good air entry Regular rate and rhythm Abdomen soft, abdominal surgical wound bandaged  No Cyanosis, Clubbing or edema, No new Rash or bruise       Data Review:   Patient Lines/Drains/Airways Status     Active Line/Drains/Airways      Name Placement date Placement time Site Days   Peripheral IV 11/18/24 22 G 1 Left;Anterior;Lateral Forearm 11/18/24  0510  Forearm  less than 1   External Urinary Catheter 11/01/24  1900  --  17   Small Bore Feeding Tube Left nare Marking at nare/corner of mouth 68 cm 11/01/24  1536  Left nare  17   Wound 10/23/24 0923 Surgical Closed Surgical Incision Abdomen 10/23/24  0923  Abdomen  26   Wound 10/23/24 0923 Surgical Laparoscopic Abdomen 1: Upper;Left 2: Left;Lower 10/23/24  0923  Abdomen  26   Wound 11/01/24 1600 Irritant Contact Dermatitis Buttocks 11/01/24  1600  Buttocks  17             Inpatient Medications  Scheduled Meds:  amLODipine   10 mg Per Tube Daily   carvedilol   12.5 mg Per Tube BID WC   feeding supplement (KATE FARMS STANDARD 1.4) Liquid  960 mL Per Tube Q24H   feeding supplement (PROSource TF20)  60 mL Per Tube Daily   free water   200 mL Per Tube Q6H   gabapentin   100 mg Per Tube Q12H   Gerhardt's butt cream   Topical BID   heparin  injection (subcutaneous)  5,000 Units Subcutaneous Q8H   hydrALAZINE   50 mg Per  Tube Q8H   Influenza vac split trivalent PF  0.5 mL Intramuscular Tomorrow-1000   levothyroxine   75 mcg Per Tube Q0600   methylphenidate   5 mg Oral q morning   mouth rinse  15 mL Mouth Rinse 4 times per day   pantoprazole  (PROTONIX ) IV  40 mg Intravenous QHS   Continuous Infusions:   PRN Meds:.acetaminophen , hydrALAZINE , hydrocortisone  cream, labetalol , loperamide , ondansetron  **OR** ondansetron  (ZOFRAN ) IV, mouth rinse, traMADol   DVT Prophylaxis  heparin  injection 5,000 Units Start: 10/28/24 2200 SCD's Start: 10/23/24 1918   Recent Labs  Lab 11/12/24 0327 11/13/24 0155 11/14/24 1151 11/15/24 0525 11/16/24 0539 11/17/24 0550 11/18/24 0429  WBC 6.8 8.2 9.0 6.6 6.2 6.3 6.8  HGB 8.5* 9.1* 9.5* 9.1* 9.0* 8.8* 8.6*  HCT 25.9* 28.0* 28.1* 27.1* 27.4* 27.0* 25.7*  PLT 510* 469* 450* 433* 343 337 316  MCV 96.3 96.9 94.0 94.1 95.5 95.1 95.5   MCH 31.6 31.5 31.8 31.6 31.4 31.0 32.0  MCHC 32.8 32.5 33.8 33.6 32.8 32.6 33.5  RDW 16.0* 16.0* 15.6* 15.7* 15.4 15.2 15.4  LYMPHSABS 1.1 1.2  --   --   --   --   --   MONOABS 1.0 1.1*  --   --   --   --   --   EOSABS 0.1 0.1  --   --   --   --   --   BASOSABS 0.0 0.0  --   --   --   --   --     Recent Labs  Lab 11/12/24 0327 11/13/24 0155 11/15/24 0525 11/16/24 0539 11/17/24 0550 11/18/24 0429  NA 135 136 134* 134* 136 136  K 4.7 4.6 4.0 4.0 4.3 4.1  CL 102 101 98 99 101 100  CO2 23 24 24 23 24 24   ANIONGAP 10 11 11 12 11 12   GLUCOSE 103* 108* 114* 117* 104* 113*  BUN 51* 56* 56* 57* 55* 57*  CREATININE 1.48* 1.50* 1.57* 1.64* 1.49* 1.58*  AST 16 22  --   --   --   --   ALT 10 13  --   --   --   --   ALKPHOS 88 91  --   --   --   --   BILITOT 0.3 0.2  --   --   --   --   ALBUMIN  3.5 3.6  --   --   --   --   INR  --   --  1.0  --   --   --   MG  --  2.3  --   --   --   --   PHOS  --  4.5  --   --   --   --   CALCIUM  9.2 9.6 9.2 9.4 9.5 9.5      Recent Labs  Lab 11/13/24 0155 11/15/24 0525 11/16/24 0539 11/17/24 0550 11/18/24 0429  INR  --  1.0  --   --   --   MG 2.3  --   --   --   --   CALCIUM  9.6 9.2 9.4 9.5 9.5    --------------------------------------------------------------------------------------------------------------- Lab Results  Component Value Date   CHOL 91 10/29/2024   HDL 12 (L) 10/29/2024   LDLCALC 13 10/29/2024   LDLDIRECT 70.0 04/07/2021   TRIG 331 (H) 10/29/2024   CHOLHDL 7.6 10/29/2024    Lab Results  Component Value Date   HGBA1C 4.9 10/18/2024   No  results for input(s): TSH, T4TOTAL, FREET4, T3FREE, THYROIDAB in the last 72 hours. No results for input(s): VITAMINB12, FOLATE, FERRITIN, TIBC, IRON, RETICCTPCT in the last 72 hours. ------------------------------------------------------------------------------------------------------------------ Cardiac Enzymes No results for input(s): CKMB, TROPONINI,  MYOGLOBIN in the last 168 hours.  Invalid input(s): CK  Micro Results No results found for this or any previous visit (from the past 240 hours).   Radiology Reports  No results found.       Signature  -   Brayton Lye M.D on 11/18/2024 at 12:55 PM   -  To page go to [email or web address]     "

## 2024-11-18 NOTE — Progress Notes (Signed)
 Speech Language Pathology Treatment: Dysphagia;Cognitive-Linguistic  Patient Details Name: Erin [name] MRN: [id number] DOB: 1956-10-16 Today's Date: 11/18/2024 Time: 1135-1201 SLP Time Calculation (min) (ACUTE ONLY): 26 min  Assessment / Plan / Recommendation Clinical Impression  Husband to bring lower denture plate however not located in room. Able to masticate regular texture timely without residue however recommend continue Dys 3/thin and can assess with regular tray prior to upgrade and when dentures present. No indications of decreased airway protection with multiple cup sips thin. She needed max cues to recall 2 swallows initially and at end of session clarification needed. Scheduled for PEG to supplement intake  tomorrow due to decreased intake prior to admission and currently.  Informal and limited reassessment of communication/cognition with improvements since initial assessment. She responds appropriately to questions and comprehends simple information. Pt exhibits delayed processing, anomia  (40% independent) facilitated with phrase completion cues, max cues for divergent naming, one semantic paraphasia noted. Impacting also appears to be decreased vision (infarct in occipital lobe). Recalled she is getting PEG tomorrow. Memory appeared impaired however will further assess in tx and in context of communication difficulty and cognitive impairments.    HPI HPI: 68 yo F adm to [facility] 12/3 for ventral hernia repair. 12/4 ex lap with abd hemorrhage, remained intubated post op. 12/5 abdominal closure. 2/8 MRI: Numerous scattered areas of restricted diffusion throughout the cerebral  hemispheres and cerebellum concerning for acute infarcts of embolic etiology, associated mild edema, particularly within the right occipital lobe and left  temporal lobe. 12/9 transfer to [facility]. 12/11 extubated. BSE 11/01/24: NPO, ice chips after oral care. MBS 12/17: silent aspiration, DNFC; NPO. Significant issues  with attn/cog. SLP following for cognitive-linguistic deficits and dysphagia. PMHx: anal CA, hypothyroidism, GERD, CAD, cardiomyopathy, PVD, HLD, HTN, RLS. Per 2023 MBS, mild pharyngeal dysphagia and suspected primary esophageal dysphagia. MBSS completed on 11/11/24 showed mild oropharygneal dysphagia with recommendations of a full liquid/thin liquid diet.      SLP Plan  Continue with current plan of care        Swallow Evaluation Recommendations   Recommendations: PO diet PO Diet Recommendation: Dysphagia 3 (Mechanical soft);Thin liquids (Level 0) Liquid Administration via: Cup;No straw Medication Administration: Via alternative means Postural changes: Position pt fully upright for meals Oral care recommendations: Oral care BID (2x/day)     Recommendations                     Oral care BID   Frequent or constant Supervision/Assistance Dysphagia, unspecified (R13.10);Cognitive communication deficit (M58.158)     Continue with current plan of care     Dustin Olam Bull  11/18/2024, 12:11 PM

## 2024-11-18 NOTE — TOC Progression Note (Signed)
 Transition of Care [facility]) - Progression Note    Patient Details  Name: Erin [name] MRN: [id number] Date of Birth: 1955-12-25  [facility] CM/SW Contact  Inocente GORMAN Kindle, LCSW Phone Number: 11/18/2024, 9:34 AM  Clinical Narrative:    CSW following for SNF referrals once PEG is placed.    Expected Discharge Plan: SNF Barriers to Discharge: Continued Medical Work up, SNF pending bed offer, Insurance approval              Expected Discharge Plan and Services   Discharge Planning Services: CM Consult Post Acute Care Choice: Long Term Acute Care (LTAC) Living arrangements for the past 2 months: Single Family Home Expected Discharge Date: 10/23/24               DME Arranged: N/A DME Agency: NA       HH Arranged: NA HH Agency: NA         Social Drivers of Health (SDOH) Interventions SDOH Screenings   Food Insecurity: No Food Insecurity (10/24/2024)  Housing: Low Risk (10/24/2024)  Transportation Needs: No Transportation Needs (10/24/2024)  Utilities: Not At Risk (10/24/2024)  Alcohol Screen: Low Risk (02/26/2024)  Depression (PHQ2-9): Low Risk (02/26/2024)  Financial Resource Strain: Low Risk (02/26/2024)  Physical Activity: Inactive (02/26/2024)  Social Connections: Socially Integrated (10/24/2024)  Stress: No Stress Concern Present (02/26/2024)  Tobacco Use: Medium Risk (10/23/2024)  Health Literacy: Adequate Health Literacy (02/26/2024)    Readmission Risk Interventions     No data to display

## 2024-11-18 NOTE — Progress Notes (Signed)
 Nutrition Follow-up  DOCUMENTATION CODES:   Severe malnutrition in context of chronic illness  INTERVENTION:  Modify TF via Cortrak to cyclic feeding (6PM-6AM): -[employer] 1.4 Standard at 60 ml/h (720 ml per day) -Continue Pro-Source TF20 60 mL daily    -Free water  flushes 200 ml q6h (800 ml per day) -Provides 1088 kcal (73% estimated needs), 65 gm protein (87% estimated needs), 511 ml free water  daily (1311 mL total TF + FWF)   Continue Magic cup TID with meals, each supplement provides 290 kcal and 9 grams of protein    Monitor SLP notes for diet advancement/tolerance and ability to wean nutrition support   Monitor GOC discussion and modify plan of care as indicated  Should boluses be indicated, may modify to bolus schedule once tolerating. Based on PO intake, would need: 2 boluses (325 ml) [employer] 1.4 Standard daily  Provides 910 kcals per day (61% estimated needs), 40g protein (53% estimated needs), 462 ml free water  (TF only)  NUTRITION DIAGNOSIS:  Severe Malnutrition related to chronic illness as evidenced by severe muscle depletion, severe fat depletion.  GOAL:  Patient will meet greater than or equal to 90% of their needs  MONITOR:  PO intake, Supplement acceptance, Diet advancement, TF tolerance, Weight trends  REASON FOR ASSESSMENT:   Consult Enteral/tube feeding initiation and management (Trickle TF)  ASSESSMENT:   68 y.o. female with PMH GERD. IBS, CAD, HTN who presented for elective hernia repair.  12/3 s/p hernia repair; became hypotensive after surgery requiring vasopressors, transferred to the ICU 12/4 returned to OR for ex-lap, abdominal packing and wound vac application; NGT placed; returned to ICU intubated 12/5 returned to OR for open abdomen - no evidence of ongoing bleeding and abdomen closed  12/7 tube feedings started  12/9 transfer to [facility] ICU 12/11 extubated  12/15 transfer to [facility] team - GOC discussion w/ husband: full scope 12/17 transfer  out of ICU; MBS: NPO; general surgery eval for PEG 12/18 modify to [employer] TF formula  12/22 MBS: diet advanced to full liquid diet (thins) 12/24 modify to cyclic feedings (16 hours per day) 12/26 - modify to 12 hour run time, per MD; calorie count started 12/29 - calorie count ended: insufficient  12/30 - PEG placement scheduled    Patient diet advanced yesterday s/p MBS on 12/24. TF regimen modified to cycling (16 hours) to allow for maximum PO intake. Per RN on 12/26, husband requesting reduction in TF to allow for increased intake. MD modified to 12 hour run time. Calorie count initiated to assess intake. PEG placement scheduled for tomorrow. Given patient's significantly increased calorie/protein needs and worsening muscle and fat depletions on exam, will need supplemental nutrition support at minimum to sufficiently meet needs enough to progress. This is complicated by patient's dislike for ONS. Chocolate Magic Cup is the only supplements she is willing to receive and acceptance is sporadic. She confirms this at bedside today.    48 hour calorie count ordered over the weekend. Results can be viewed below. On average, meeting 53% of estimated calorie and 29% estimated protein needs across the two days. Given inconsistency in intake and observed breakfast tray untouched this morning, continue to recommend supplemental nutrition support.   Day 1: 12/27 Breakfast: 0% breakfast meal consumed (0 kcals, 0g protein) Lunch: 25% hamburger, 100% cola (239 kcals, 8g protein) Dinner: No documentation received (0 kcals, 0g protein) Supplements: 100% Magic Cup x1 (290 kcals, 9g protein)  Day 1 Total Intake: 529 kcal (35%  of minimum estimated needs)  17g protein (23% of minimum estimated needs)  Day 2: 12/28 Breakfast: 65% scrambled eggs, pancakes, biscuit, 100% grape juice x1 (610 kcals, 9g protein) Lunch: 50% baked chicken tenders , pinto beans, and peach cobbler (305 kcals, 13g protein) Dinner:  No documentation received (0 kcals, 0g protein) Supplements: 50% Magic Cup x1 (145 kcals, 4.5g protein)  Day 2 Total Intake: 1060 kcal (71% of minimum estimated needs)  26.5g protein (35% of minimum estimated needs)  Much more awake and alert than last interaction. Breakfast tray is at bedside untouched. Able to answer some questions, although does become tearful when speaking stating she cannot remember certain things. States she is having trouble remembering her name, the day and the date at times.    Admit Weight: 44.4 kg Current Weight: 45.7 kg   Weight stable. No significant edema on exam.  Bowels moving/stable. Documented with two BMs yesterday. Lab trends have also improved with regular bowel movements and stable renal function.   Drains/Lines: Cortrak (gastric): placed 12/12  Meds: levothyroxine , pantoprazole    BUN/Crt stable. Monitoring kidney function. Electrolytes have stabilized. Has not required recent potassium, magnesium , or phosphorus repletion.  Labs:  Na+ 136 (wdl) K+ 4.1 (wdl) PHOS 4.5 (12/24) Mg 2.3 (12/24) BUN 57 Crt 1.58 CBGs 97-125 x24 hours A1c 4.9 (09/2024)   NUTRITION - FOCUSED PHYSICAL EXAM: Repeat NFPE completed today. Worsening muscle and fat depletions on exam suggestive of immobility, but also prolonged insufficient intake in the setting of acute illness with chronic comorbid conditions. She now meets criteria for severe malnutrition.  Nutrition support remains indicated.   Flowsheet Row Most Recent Value  Orbital Region Moderate depletion  Upper Arm Region Severe depletion  Thoracic and Lumbar Region Severe depletion  Buccal Region Severe depletion  Temple Region Moderate depletion  Clavicle Bone Region Severe depletion  Clavicle and Acromion Bone Region Severe depletion  Scapular Bone Region Severe depletion  Dorsal Hand Mild depletion  Patellar Region Severe depletion  Anterior Thigh Region Severe depletion  Posterior Calf Region Severe  depletion  Edema (RD Assessment) None  Hair Reviewed  Eyes Reviewed  Mouth Reviewed  Skin Reviewed  Nails Reviewed    Diet Order:   Diet Order             DIET DYS 3 Room service appropriate? Yes with Assist; Fluid consistency: Thin  Diet effective now           Diet - low sodium heart healthy            EDUCATION NEEDS:  Not appropriate for education at this time  Skin:  Skin Assessment: Skin Integrity Issues: Skin Integrity Issues:: Incisions Incisions: Surgical Abdomen  Last BM:  12/23- type 7 x1  Height:  Ht Readings from Last 1 Encounters:  10/25/24 5' 3 (1.6 m)   Weight:  Wt Readings from Last 1 Encounters:  11/17/24 45.7 kg    Ideal Body Weight:  52.3 kg  BMI:  Body mass index is 17.85 kg/m.  Estimated Nutritional Needs:   Kcal:  1500-1700  Protein:  75-90 grams  Fluid:  >/= 1.5L  Blair Deaner MS, RD, LDN Registered Dietitian I Clinical Nutrition RD Inpatient Contact Info in Amion

## 2024-11-18 NOTE — Plan of Care (Signed)
 " Problem: Education: Goal: Knowledge of General Education information will improve Description: Including pain rating scale, medication(s)/side effects and non-pharmacologic comfort measures Outcome: Progressing   Problem: Health Behavior/Discharge Planning: Goal: Ability to manage health-related needs will improve Outcome: Progressing   Problem: Clinical Measurements: Goal: Ability to maintain clinical measurements within normal limits will improve Outcome: Progressing Goal: Will remain free from infection Outcome: Progressing Goal: Diagnostic test results will improve Outcome: Progressing Goal: Respiratory complications will improve Outcome: Progressing Goal: Cardiovascular complication will be avoided Outcome: Progressing   Problem: Activity: Goal: Risk for activity intolerance will decrease Outcome: Progressing   Problem: Nutrition: Goal: Adequate nutrition will be maintained Outcome: Progressing   Problem: Coping: Goal: Level of anxiety will decrease Outcome: Progressing   Problem: Elimination: Goal: Will not experience complications related to bowel motility Outcome: Progressing Goal: Will not experience complications related to urinary retention Outcome: Progressing   Problem: Pain Managment: Goal: General experience of comfort will improve and/or be controlled Outcome: Progressing   Problem: Safety: Goal: Ability to remain free from injury will improve Outcome: Progressing   Problem: Skin Integrity: Goal: Risk for impaired skin integrity will decrease Outcome: Progressing   Problem: Activity: Goal: Ability to tolerate increased activity will improve Outcome: Progressing   Problem: Respiratory: Goal: Ability to maintain a clear airway and adequate ventilation will improve Outcome: Progressing   Problem: Role Relationship: Goal: Method of communication will improve Outcome: Progressing   Problem: Education: Goal: Knowledge of disease or condition  will improve Outcome: Progressing Goal: Knowledge of secondary prevention will improve (MUST DOCUMENT ALL) Outcome: Progressing Goal: Knowledge of patient specific risk factors will improve (DELETE if not current risk factor) Outcome: Progressing   Problem: Ischemic Stroke/TIA Tissue Perfusion: Goal: Complications of ischemic stroke/TIA will be minimized Outcome: Progressing   Problem: Coping: Goal: Will verbalize positive feelings about self Outcome: Progressing Goal: Will identify appropriate support needs Outcome: Progressing   Problem: Health Behavior/Discharge Planning: Goal: Ability to manage health-related needs will improve Outcome: Progressing Goal: Goals will be collaboratively established with patient/family Outcome: Progressing   Problem: Self-Care: Goal: Ability to participate in self-care as condition permits will improve Outcome: Progressing Goal: Verbalization of feelings and concerns over difficulty with self-care will improve Outcome: Progressing Goal: Ability to communicate needs accurately will improve Outcome: Progressing   Problem: Nutrition: Goal: Risk of aspiration will decrease Outcome: Progressing Goal: Dietary intake will improve Outcome: Progressing   Problem: Education: Goal: Knowledge of disease or condition will improve Outcome: Progressing Goal: Knowledge of secondary prevention will improve (MUST DOCUMENT ALL) Outcome: Progressing Goal: Knowledge of patient specific risk factors will improve (DELETE if not current risk factor) Outcome: Progressing   Problem: Ischemic Stroke/TIA Tissue Perfusion: Goal: Complications of ischemic stroke/TIA will be minimized Outcome: Progressing   Problem: Coping: Goal: Will verbalize positive feelings about self Outcome: Progressing Goal: Will identify appropriate support needs Outcome: Progressing   Problem: Health Behavior/Discharge Planning: Goal: Ability to manage health-related needs will  improve Outcome: Progressing Goal: Goals will be collaboratively established with patient/family Outcome: Progressing   Problem: Self-Care: Goal: Ability to participate in self-care as condition permits will improve Outcome: Progressing Goal: Verbalization of feelings and concerns over difficulty with self-care will improve Outcome: Progressing Goal: Ability to communicate needs accurately will improve Outcome: Progressing   Problem: Nutrition: Goal: Risk of aspiration will decrease Outcome: Progressing Goal: Dietary intake will improve Outcome: Progressing   Problem: Education: Goal: Knowledge of disease or condition will improve Outcome: Progressing Goal: Knowledge of secondary prevention will  improve (MUST DOCUMENT ALL) Outcome: Progressing Goal: Knowledge of patient specific risk factors will improve (DELETE if not current risk factor) Outcome: Progressing   Problem: Ischemic Stroke/TIA Tissue Perfusion: Goal: Complications of ischemic stroke/TIA will be minimized Outcome: Progressing   "

## 2024-11-18 NOTE — Plan of Care (Signed)
 " Problem: Education: Goal: Knowledge of General Education information will improve Description: Including pain rating scale, medication(s)/side effects and non-pharmacologic comfort measures 11/18/2024 0611 by Hanley Clem KIDD, RN Outcome: Progressing 11/17/2024 2331 by Hanley Clem KIDD, RN Outcome: Progressing   Problem: Health Behavior/Discharge Planning: Goal: Ability to manage health-related needs will improve 11/18/2024 0611 by Hanley Clem KIDD, RN Outcome: Progressing 11/17/2024 2331 by Hanley Clem KIDD, RN Outcome: Progressing   Problem: Clinical Measurements: Goal: Ability to maintain clinical measurements within normal limits will improve 11/18/2024 0611 by Hanley Clem KIDD, RN Outcome: Progressing 11/17/2024 2331 by Hanley Clem KIDD, RN Outcome: Progressing Goal: Will remain free from infection 11/18/2024 0611 by Hanley Clem KIDD, RN Outcome: Progressing 11/17/2024 2331 by Hanley Clem KIDD, RN Outcome: Progressing Goal: Diagnostic test results will improve 11/18/2024 0611 by Hanley Clem KIDD, RN Outcome: Progressing 11/17/2024 2331 by Hanley Clem KIDD, RN Outcome: Progressing Goal: Respiratory complications will improve 11/18/2024 0611 by Hanley Clem KIDD, RN Outcome: Progressing 11/17/2024 2331 by Hanley Clem KIDD, RN Outcome: Progressing Goal: Cardiovascular complication will be avoided 11/18/2024 0611 by Hanley Clem KIDD, RN Outcome: Progressing 11/17/2024 2331 by Hanley Clem KIDD, RN Outcome: Progressing   Problem: Activity: Goal: Risk for activity intolerance will decrease 11/18/2024 0611 by Hanley Clem KIDD, RN Outcome: Progressing 11/17/2024 2331 by Hanley Clem KIDD, RN Outcome: Progressing   Problem: Nutrition: Goal: Adequate nutrition will be maintained 11/18/2024 0611 by Hanley Clem KIDD, RN Outcome: Progressing 11/17/2024 2331 by Hanley Clem KIDD, RN Outcome: Progressing   Problem: Coping: Goal: Level of anxiety will decrease 11/18/2024 0611 by Hanley Clem KIDD, RN Outcome: Progressing 11/17/2024 2331 by Hanley Clem KIDD, RN Outcome: Progressing   Problem: Elimination: Goal: Will not experience complications related to bowel motility 11/18/2024 0611 by Hanley Clem KIDD, RN Outcome: Progressing 11/17/2024 2331 by Hanley Clem KIDD, RN Outcome: Progressing Goal: Will not experience complications related to urinary retention 11/18/2024 0611 by Hanley Clem KIDD, RN Outcome: Progressing 11/17/2024 2331 by Hanley Clem KIDD, RN Outcome: Progressing   Problem: Pain Managment: Goal: General experience of comfort will improve and/or be controlled 11/18/2024 0611 by Hanley Clem KIDD, RN Outcome: Progressing 11/17/2024 2331 by Hanley Clem KIDD, RN Outcome: Progressing   Problem: Safety: Goal: Ability to remain free from injury will improve 11/18/2024 0611 by Hanley Clem KIDD, RN Outcome: Progressing 11/17/2024 2331 by Hanley Clem KIDD, RN Outcome: Progressing   Problem: Skin Integrity: Goal: Risk for impaired skin integrity will decrease 11/18/2024 0611 by Hanley Clem KIDD, RN Outcome: Progressing 11/17/2024 2331 by Hanley Clem KIDD, RN Outcome: Progressing   Problem: Activity: Goal: Ability to tolerate increased activity will improve 11/18/2024 0611 by Hanley Clem KIDD, RN Outcome: Progressing 11/17/2024 2331 by Hanley Clem KIDD, RN Outcome: Progressing   Problem: Respiratory: Goal: Ability to maintain a clear airway and adequate ventilation will improve 11/18/2024 0611 by Hanley Clem KIDD, RN Outcome: Progressing 11/17/2024 2331 by Hanley Clem KIDD, RN Outcome: Progressing   Problem: Role Relationship: Goal: Method of communication will improve 11/18/2024 0611 by Hanley Clem KIDD, RN Outcome: Progressing 11/17/2024 2331 by Hanley Clem KIDD, RN Outcome: Progressing   Problem: Education: Goal: Knowledge of disease or condition will improve 11/18/2024 0611 by Hanley Clem KIDD, RN Outcome: Progressing 11/17/2024 2331 by Hanley Clem KIDD, RN Outcome: Progressing Goal: Knowledge of secondary prevention will improve (MUST DOCUMENT ALL) 11/18/2024 0611 by Hanley Clem KIDD, RN Outcome: Progressing 11/17/2024 2331 by Hanley Clem KIDD, RN Outcome: Progressing Goal: Knowledge of patient specific risk factors will  improve (DELETE if not current risk factor) 11/18/2024 0611 by Hanley Clem KIDD, RN Outcome: Progressing 11/17/2024 2331 by Hanley Clem KIDD, RN Outcome: Progressing   Problem: Ischemic Stroke/TIA Tissue Perfusion: Goal: Complications of ischemic stroke/TIA will be minimized 11/18/2024 0611 by Hanley Clem KIDD, RN Outcome: Progressing 11/17/2024 2331 by Hanley Clem KIDD, RN Outcome: Progressing   Problem: Coping: Goal: Will verbalize positive feelings about self 11/18/2024 0611 by Hanley Clem KIDD, RN Outcome: Progressing 11/17/2024 2331 by Hanley Clem KIDD, RN Outcome: Progressing Goal: Will identify appropriate support needs 11/18/2024 0611 by Hanley Clem KIDD, RN Outcome: Progressing 11/17/2024 2331 by Hanley Clem KIDD, RN Outcome: Progressing   Problem: Health Behavior/Discharge Planning: Goal: Ability to manage health-related needs will improve 11/18/2024 0611 by Hanley Clem KIDD, RN Outcome: Progressing 11/17/2024 2331 by Hanley Clem KIDD, RN Outcome: Progressing Goal: Goals will be collaboratively established with patient/family 11/18/2024 [phone] by Hanley Clem KIDD, RN Outcome: Progressing 11/17/2024 2331 by Hanley Clem KIDD, RN Outcome: Progressing   Problem: Self-Care: Goal: Ability to participate in self-care as condition permits will improve 11/18/2024 0611 by Hanley Clem KIDD, RN Outcome: Progressing 11/17/2024 2331 by Hanley Clem KIDD, RN Outcome: Progressing Goal: Verbalization of feelings and concerns over difficulty with self-care will improve 11/18/2024 0611 by Hanley Clem KIDD, RN Outcome: Progressing 11/17/2024 2331 by Hanley Clem KIDD, RN Outcome: Progressing Goal: Ability to communicate  needs accurately will improve 11/18/2024 0611 by Hanley Clem KIDD, RN Outcome: Progressing 11/17/2024 2331 by Hanley Clem KIDD, RN Outcome: Progressing   Problem: Nutrition: Goal: Risk of aspiration will decrease 11/18/2024 0611 by Hanley Clem KIDD, RN Outcome: Progressing 11/17/2024 2331 by Hanley Clem KIDD, RN Outcome: Progressing Goal: Dietary intake will improve 11/18/2024 0611 by Hanley Clem KIDD, RN Outcome: Progressing 11/17/2024 2331 by Hanley Clem KIDD, RN Outcome: Progressing   Problem: Education: Goal: Knowledge of disease or condition will improve 11/18/2024 0611 by Hanley Clem KIDD, RN Outcome: Progressing 11/17/2024 2331 by Hanley Clem KIDD, RN Outcome: Progressing Goal: Knowledge of secondary prevention will improve (MUST DOCUMENT ALL) 11/18/2024 0611 by Hanley Clem KIDD, RN Outcome: Progressing 11/17/2024 2331 by Hanley Clem KIDD, RN Outcome: Progressing Goal: Knowledge of patient specific risk factors will improve (DELETE if not current risk factor) 11/18/2024 0611 by Hanley Clem KIDD, RN Outcome: Progressing 11/17/2024 2331 by Hanley Clem KIDD, RN Outcome: Progressing   Problem: Ischemic Stroke/TIA Tissue Perfusion: Goal: Complications of ischemic stroke/TIA will be minimized 11/18/2024 0611 by Hanley Clem KIDD, RN Outcome: Progressing 11/17/2024 2331 by Hanley Clem KIDD, RN Outcome: Progressing   Problem: Coping: Goal: Will verbalize positive feelings about self 11/18/2024 0611 by Hanley Clem KIDD, RN Outcome: Progressing 11/17/2024 2331 by Hanley Clem KIDD, RN Outcome: Progressing Goal: Will identify appropriate support needs 11/18/2024 0611 by Hanley Clem KIDD, RN Outcome: Progressing 11/17/2024 2331 by Hanley Clem KIDD, RN Outcome: Progressing   Problem: Health Behavior/Discharge Planning: Goal: Ability to manage health-related needs will improve 11/18/2024 0611 by Hanley Clem KIDD, RN Outcome: Progressing 11/17/2024 2331 by Hanley Clem KIDD, RN Outcome:  Progressing Goal: Goals will be collaboratively established with patient/family 11/18/2024 [phone] by Hanley Clem KIDD, RN Outcome: Progressing 11/17/2024 2331 by Hanley Clem KIDD, RN Outcome: Progressing   Problem: Self-Care: Goal: Ability to participate in self-care as condition permits will improve 11/18/2024 0611 by Hanley Clem KIDD, RN Outcome: Progressing 11/17/2024 2331 by Hanley Clem KIDD, RN Outcome: Progressing Goal: Verbalization of feelings and concerns over difficulty with self-care will improve 11/18/2024 0611 by Hanley Clem KIDD, RN Outcome: Progressing  11/17/2024 2331 by Hanley Clem KIDD, RN Outcome: Progressing Goal: Ability to communicate needs accurately will improve 11/18/2024 0611 by Hanley Clem KIDD, RN Outcome: Progressing 11/17/2024 2331 by Hanley Clem KIDD, RN Outcome: Progressing   Problem: Nutrition: Goal: Risk of aspiration will decrease 11/18/2024 0611 by Hanley Clem KIDD, RN Outcome: Progressing 11/17/2024 2331 by Hanley Clem KIDD, RN Outcome: Progressing Goal: Dietary intake will improve 11/18/2024 0611 by Hanley Clem KIDD, RN Outcome: Progressing 11/17/2024 2331 by Hanley Clem KIDD, RN Outcome: Progressing   Problem: Education: Goal: Knowledge of disease or condition will improve 11/18/2024 0611 by Hanley Clem KIDD, RN Outcome: Progressing 11/17/2024 2331 by Hanley Clem KIDD, RN Outcome: Progressing Goal: Knowledge of secondary prevention will improve (MUST DOCUMENT ALL) 11/18/2024 0611 by Hanley Clem KIDD, RN Outcome: Progressing 11/17/2024 2331 by Hanley Clem KIDD, RN Outcome: Progressing Goal: Knowledge of patient specific risk factors will improve (DELETE if not current risk factor) 11/18/2024 0611 by Hanley Clem KIDD, RN Outcome: Progressing 11/17/2024 2331 by Hanley Clem KIDD, RN Outcome: Progressing   Problem: Ischemic Stroke/TIA Tissue Perfusion: Goal: Complications of ischemic stroke/TIA will be minimized 11/18/2024 0611 by Hanley Clem KIDD,  RN Outcome: Progressing 11/17/2024 2331 by Hanley Clem KIDD, RN Outcome: Progressing   "

## 2024-11-19 ENCOUNTER — Inpatient Hospital Stay (HOSPITAL_COMMUNITY)

## 2024-11-19 DIAGNOSIS — Z8719 Personal history of other diseases of the digestive system: Secondary | ICD-10-CM | POA: Diagnosis not present

## 2024-11-19 DIAGNOSIS — Z9889 Other specified postprocedural states: Secondary | ICD-10-CM | POA: Diagnosis not present

## 2024-11-19 HISTORY — PX: IR GASTROSTOMY TUBE MOD SED: IMG625

## 2024-11-19 LAB — BASIC METABOLIC PANEL WITH GFR
Anion gap: 11 (ref 5–15)
BUN: 55 mg/dL — ABNORMAL HIGH (ref 8–23)
CO2: 22 mmol/L (ref 22–32)
Calcium: 9.5 mg/dL (ref 8.9–10.3)
Chloride: 99 mmol/L (ref 98–111)
Creatinine, Ser: 1.59 mg/dL — ABNORMAL HIGH (ref 0.44–1.00)
GFR, Estimated: 35 mL/min — ABNORMAL LOW
Glucose, Bld: 104 mg/dL — ABNORMAL HIGH (ref 70–99)
Potassium: 4.3 mmol/L (ref 3.5–5.1)
Sodium: 133 mmol/L — ABNORMAL LOW (ref 135–145)

## 2024-11-19 LAB — GLUCOSE, CAPILLARY
Glucose-Capillary: 100 mg/dL — ABNORMAL HIGH (ref 70–99)
Glucose-Capillary: 148 mg/dL — ABNORMAL HIGH (ref 70–99)
Glucose-Capillary: 108 mg/dL — ABNORMAL HIGH (ref 70–99)
Glucose-Capillary: 95 mg/dL (ref 70–99)

## 2024-11-19 LAB — CBC
HCT: 28.7 % — ABNORMAL LOW (ref 36.0–46.0)
Hemoglobin: 9.3 g/dL — ABNORMAL LOW (ref 12.0–15.0)
MCH: 30.9 pg (ref 26.0–34.0)
MCHC: 32.4 g/dL (ref 30.0–36.0)
MCV: 95.3 fL (ref 80.0–100.0)
Platelets: 288 K/uL (ref 150–400)
RBC: 3.01 MIL/uL — ABNORMAL LOW (ref 3.87–5.11)
RDW: 15.2 % (ref 11.5–15.5)
WBC: 7.1 K/uL (ref 4.0–10.5)
nRBC: 0 % (ref 0.0–0.2)

## 2024-11-19 LAB — PROTIME-INR
INR: 0.9 (ref 0.8–1.2)
Prothrombin Time: 13.1 s (ref 11.4–15.2)

## 2024-11-19 MED ORDER — IOHEXOL 300 MG/ML  SOLN
50.0000 mL | Freq: Once | INTRAMUSCULAR | Status: AC | PRN
  Administered 2024-11-19: 15 mL

## 2024-11-19 MED ORDER — FENTANYL CITRATE (PF) 100 MCG/2ML IJ SOLN
INTRAMUSCULAR | Status: AC | PRN
  Administered 2024-11-19: 25 ug via INTRAVENOUS
  Administered 2024-11-19: 50 ug via INTRAVENOUS
  Administered 2024-11-19: 25 ug via INTRAVENOUS

## 2024-11-19 MED ORDER — GLUCAGON HCL RDNA (DIAGNOSTIC) 1 MG IJ SOLR
INTRAMUSCULAR | Status: AC | PRN
  Administered 2024-11-19: .5 mg via INTRAVENOUS

## 2024-11-19 MED ORDER — LIDOCAINE HCL 1 % IJ SOLN
20.0000 mL | Freq: Once | INTRAMUSCULAR | Status: AC
  Administered 2024-11-19: 10 mL
  Filled 2024-11-19: qty 20

## 2024-11-19 MED ORDER — GLUCAGON HCL RDNA (DIAGNOSTIC) 1 MG IJ SOLR
INTRAMUSCULAR | Status: AC
  Filled 2024-11-19: qty 1

## 2024-11-19 MED ORDER — MIDAZOLAM HCL (PF) 2 MG/2ML IJ SOLN
INTRAMUSCULAR | Status: AC | PRN
  Administered 2024-11-19: 1 mg via INTRAVENOUS

## 2024-11-19 MED ORDER — MIDAZOLAM HCL 2 MG/2ML IJ SOLN
INTRAMUSCULAR | Status: AC
  Filled 2024-11-19: qty 2

## 2024-11-19 MED ORDER — FENTANYL CITRATE (PF) 100 MCG/2ML IJ SOLN
INTRAMUSCULAR | Status: AC
  Filled 2024-11-19: qty 2

## 2024-11-19 MED ORDER — CEFAZOLIN SODIUM-DEXTROSE 2-4 GM/100ML-% IV SOLN
INTRAVENOUS | Status: AC | PRN
  Administered 2024-11-19: 2 g via INTRAVENOUS

## 2024-11-19 MED ORDER — LIDOCAINE-EPINEPHRINE 1 %-1:100000 IJ SOLN
INTRAMUSCULAR | Status: AC
  Filled 2024-11-19: qty 1

## 2024-11-19 MED ORDER — CEFAZOLIN SODIUM-DEXTROSE 2-4 GM/100ML-% IV SOLN
INTRAVENOUS | Status: AC
  Filled 2024-11-19: qty 100

## 2024-11-19 NOTE — Progress Notes (Signed)
 "                        PROGRESS NOTE        PATIENT DETAILS Name: Erin [name] Age: 68 y.o. Sex: female Date of Birth: [birth year] Admit Date: 10/23/2024 Admitting Physician Lynda Leos, MD [name], Heron [name], MD  Brief Summary: Patient is a 68 y.o.  female with history of CAD s/p PCI, PAD s/p B/L femoral bypass 2014-mesenteric stent placement 08/2024, DM-2, HTN, HLD, anal cancer-s/p chemo/XRT 2014-who was initially admitted by [facility] for incisional hernia on 12/3-unfortunately-developed postoperative hemorrhagic shock in the setting of hemoperitoneum requiring intubation/pressors and transferred to ICU.  Further hospital course complicated by AKI, B/L CVA in the setting of hypotension.  She was stabilized in the ICU-subsequently transferred to [facility] on 12/15.  See below for further details.  Significant events: 12/3>> incisional hernia repair-developed postoperative hypotension and acute blood loss anemia-Enterline placed-transferred to ICU  12/4>> back to operating room-hemoperitoneum-active bleed not identified-abdomen packed-wound VAC placed-left intubated-transferred back to the ICU with open abdomen. 12/5>> back to the operating room-no evidence of bleeding-abdomen closed 12/8>> neuroimaging suggestive of CVA 12/11>> extubated 12/15>> transferred to [facility] 12/30>> PEG by IR  Significant studies: 11/28>> A1c: 4.9 12/4>> CT angio abdomen/pelvis: Large right intraperitoneal hematoma with active bleeding from the right epigastric artery, left renal artery occluded at the origin with minimal enhancement.  Severe narrowing of the SMA immediately distal to the stent which is patent. 12/8>> MRI brain: Scattered acute infarcts in B/L cerebral hemisphere/cerebellum. 12/8>> MRI brain: No LVO-no significant intra or cranial stenosis. 12/8>> echo: EF 60-65% 12/9>> carotid Doppler: No significant stenosis 12/9>> LDL: 13 12/24>> CT abdomen: Safe percutaneous window for gastrostomy  placement  Significant microbiology data: 12/6>> tracheal aspirate: Enterobacter cloacae 12/9>> tracheal aspirate: Respiratory flora 12/13>> stool C. difficile: Negative 12/13>> GI pathogen panel: No growth  Procedures: See above  Consults: General Surgery PCCM Nephrology Neurology IR  Subjective: Relatively awake/alert-per husband-speech has improved quite a lot-patient is now able to speak almost in full sentences.  Follows commands.  Denies any chest pain or shortness of breath-some soreness at the PEG tube insertion site.  Objective: Vitals: Blood pressure (!) 156/88, pulse 89, temperature 98 F (36.7 C), temperature source Axillary, resp. rate 12, height 5' 3 (1.6 m), weight 44.9 kg, SpO2 95%.   Exam: Gen Exam:Alert awake-not in any distress HEENT:atraumatic, normocephalic Chest: B/L clear to auscultation anteriorly CVS:S1S2 regular Abdomen:soft non tender, non distended Extremities:no edema Neurology: Non focal Skin: no rash  Pertinent Labs/Radiology:    Latest Ref Rng & Units 11/20/2024    3:56 AM 11/19/2024    4:01 AM 11/18/2024    4:29 AM  CBC  WBC 4.0 - 10.5 K/uL 8.2  7.1  6.8   Hemoglobin 12.0 - 15.0 g/dL 8.8  9.3  8.6   Hematocrit 36.0 - 46.0 % 26.8  28.7  25.7   Platelets 150 - 400 K/uL 269  288  316     Lab Results  Component Value Date   NA 135 11/20/2024   K 3.8 11/20/2024   CL 100 11/20/2024   CO2 23 11/20/2024      Assessment/Plan: Postoperative hemorrhagic shock with acute blood loss anemia following incisional hernia repair on 12/3 Resolved Required massive transfusion protocol with 4 units of PRBC/2 units of cryoprecipitate/1 unit of FFP and vasopressors for resuscitation.  S/p elective incisional hernia repaiR 12/3-complicated by intra-abdominal hematoma/postoperative hemorrhagic shock requiring reexploration on 12/4  and 12/5 Postop care managed by general surgery Surgery team following intermittently  AKI on CKD stage  IIIb Secondary to ATN-from hemorrhagic shock Renal function improving with supportive care  Right renal infarct Seen incidentally on CT imaging on 12/4-likely due to hypoperfusion in setting of hypotension Supportive care at this point.  Acute hypoxic respiratory failure Intubated 12/4-extubated 12/11 Currently on room air  HCAP (Enterobacter cloacae on tracheal aspirate culture) Completed a course of antibiotics  Acute bilateral cerebellar/cerebral infarct secondary to profound intracranial hypoperfusion due to hemorrhagic shock/hypotension. Workup as above Oropharyngeal dysphagia-improved but oral intake is poor-underwent PEG tube placement 12/30 Significant improvement in aphasia-almost able to talk fluently and in full sentences. Continue ASA  Oropharyngeal dysphagia Secondary to CVA/anoxic injury Followed by SLP-tolerating dysphagia 3 diet PEG tube inserted on 12/30 supplement caloric intake (per prior notes-oral intake erratic/poor) Discussed with dietitian 12/31-they will initiate tube feeds after talking with spouse-ideally needs to be on nocturnal feeding.    Acute metabolic encephalopathy Secondary to acute/critical illness/CVA/potential anoxic injury from hypotension Thankfully-significantly improved after treatment of underlying etiologies Delirium precautions  Multifactorial anemia Secondary to hemorrhagic shock-critical illness Hb stable Follow CBC periodically  History of left renal artery stenosis Supportive care  History of PAD-s/p prior aortobifem surgery 2014-stenting of SMA October 2025 (Dr. Serene) Supportive care On aspirin   History of peripheral neuropathy On Neurontin -significantly higher doses at home-cut back currently due to encephalopathy  HTN BP stable Amlodipine /Coreg /hydralazine   HLD Resume statin  Hypothyroidism Synthroid   DVT/deconditioning Secondary to critical illness SNF recommended  Nutrition Status: Nutrition  Problem: Severe Malnutrition Etiology: chronic illness Signs/Symptoms: severe muscle depletion, severe fat depletion Percent weight loss: 15 % (in 6 months) Interventions: Magic cup, Tube feeding, Liberalize Diet, Refer to RD note for recommendations  Underweight: Estimated body mass index is 17.53 kg/m as calculated from the following:   Height as of this encounter: 5' 3 (1.6 m).   Weight as of this encounter: 44.9 kg.   Code status:   Code Status: Full Code   DVT Prophylaxis: heparin  injection 5,000 Units Start: 11/21/24 0600 SCD's Start: 10/23/24 1918   Family Communication: None at bedside   Disposition Plan: Status is: Inpatient Remains inpatient appropriate because: Severity of illness   Planned Discharge Destination:Skilled nursing facility   Diet: Diet Order             DIET DYS 3 Room service appropriate? Yes; Fluid consistency: Thin  Diet effective now           Diet - low sodium heart healthy                     Antimicrobial agents: Anti-infectives (From admission, onward)    Start     Dose/Rate Route Frequency Ordered Stop   11/19/24 1200  ceFAZolin  (ANCEF ) IVPB 2g/100 mL premix       Note to Pharmacy: For IR procedure, g tube placement, tentative for tomorrow 12/30   2 g 200 mL/hr over 30 Minutes Intravenous  Once 11/18/24 1511     11/19/24 0826  ceFAZolin  (ANCEF ) IVPB 2g/100 mL premix        over 30 Minutes Intravenous Continuous PRN 11/19/24 0835 11/19/24 0857   11/01/24 1200  piperacillin -tazobactam (ZOSYN ) IVPB 3.375 g        3.375 g 12.5 mL/hr over 240 Minutes Intravenous Every 8 hours 11/01/24 0822 11/04/24 0048   10/30/24 1400  piperacillin -tazobactam (ZOSYN ) IVPB 2.25 g  Status:  Discontinued  2.25 g 100 mL/hr over 30 Minutes Intravenous Every 8 hours 10/30/24 0939 11/01/24 0822   10/28/24 1600  ceFEPIme  (MAXIPIME ) 2 g in sodium chloride  0.9 % 100 mL IVPB  Status:  Discontinued        2 g 200 mL/hr over 30 Minutes Intravenous  Every 24 hours 10/28/24 1441 10/30/24 0939   10/26/24 1800  cefTRIAXone  (ROCEPHIN ) 2 g in sodium chloride  0.9 % 100 mL IVPB  Status:  Discontinued        2 g 200 mL/hr over 30 Minutes Intravenous Daily-1800 10/26/24 1739 10/28/24 1441   10/23/24 0700  ceFAZolin  (ANCEF ) IVPB 2g/100 mL premix        2 g 200 mL/hr over 30 Minutes Intravenous On call to O.R. 10/23/24 9351 10/23/24 9166        MEDICATIONS: Scheduled Meds:  amLODipine   10 mg Per Tube Daily   aspirin  EC  81 mg Oral Daily   carvedilol   12.5 mg Per Tube BID WC   feeding supplement ([place] [place] STANDARD 1.4) Liquid  720 mL Per Tube Q24H   free water   200 mL Per Tube Q6H   gabapentin   100 mg Per Tube Q12H   Gerhardt's butt cream   Topical BID   [START ON 11/21/2024] heparin  injection (subcutaneous)  5,000 Units Subcutaneous Q8H   hydrALAZINE   50 mg Per Tube Q8H   Influenza vac split trivalent PF  0.5 mL Intramuscular Tomorrow-1000   levothyroxine   75 mcg Per Tube Q0600   methylphenidate   5 mg Oral q morning   mouth rinse  15 mL Mouth Rinse 4 times per day   pantoprazole  (PROTONIX ) IV  40 mg Intravenous QHS   Continuous Infusions:   ceFAZolin  (ANCEF ) IV     PRN Meds:.acetaminophen , hydrALAZINE , hydrocortisone  cream, labetalol , loperamide , ondansetron  **OR** ondansetron  (ZOFRAN ) IV, mouth rinse, traMADol    I have personally reviewed following labs and imaging studies  LABORATORY DATA: CBC: Recent Labs  Lab 11/16/24 0539 11/17/24 0550 11/18/24 0429 11/19/24 0401 11/20/24 0356  WBC 6.2 6.3 6.8 7.1 8.2  HGB 9.0* 8.8* 8.6* 9.3* 8.8*  HCT 27.4* 27.0* 25.7* 28.7* 26.8*  MCV 95.5 95.1 95.5 95.3 94.7  PLT 343 337 316 288 269    Basic Metabolic Panel: Recent Labs  Lab 11/16/24 0539 11/17/24 0550 11/18/24 0429 11/19/24 0401 11/20/24 0356  NA 134* 136 136 133* 135  K 4.0 4.3 4.1 4.3 3.8  CL 99 101 100 99 100  CO2 23 24 24 22 23   GLUCOSE 117* 104* 113* 104* 108*  BUN 57* 55* 57* 55* 42*  CREATININE 1.64* 1.49*  1.58* 1.59* 1.56*  CALCIUM  9.4 9.5 9.5 9.5 9.5    GFR: Estimated Creatinine Clearance: 24.5 mL/min (A) (by C-G formula based on SCr of 1.56 mg/dL (H)).  Liver Function Tests: No results for input(s): AST, ALT, ALKPHOS, BILITOT, PROT, ALBUMIN  in the last 168 hours.  No results for input(s): LIPASE, AMYLASE in the last 168 hours. No results for input(s): AMMONIA in the last 168 hours.  Coagulation Profile: Recent Labs  Lab 11/15/24 0525 11/19/24 0401  INR 1.0 0.9    Cardiac Enzymes: No results for input(s): CKTOTAL, CKMB, CKMBINDEX, TROPONINI in the last 168 hours.  BNP (last 3 results) No results for input(s): PROBNP in the last 8760 hours.  Lipid Profile: No results for input(s): CHOL, HDL, LDLCALC, TRIG, CHOLHDL, LDLDIRECT in the last 72 hours.  Thyroid  Function Tests: No results for input(s): TSH, T4TOTAL, FREET4, T3FREE, THYROIDAB in the  last 72 hours.  Anemia Panel: No results for input(s): VITAMINB12, FOLATE, FERRITIN, TIBC, IRON, RETICCTPCT in the last 72 hours.  Urine analysis:    Component Value Date/Time   COLORURINE STRAW (A) 10/29/2024 1730   APPEARANCEUR CLEAR 10/29/2024 1730   LABSPEC 1.009 10/29/2024 1730   PHURINE 8.0 10/29/2024 1730   GLUCOSEU 50 (A) 10/29/2024 1730   HGBUR SMALL (A) 10/29/2024 1730   BILIRUBINUR NEGATIVE 10/29/2024 1730   BILIRUBINUR negative 09/06/2018 1057   KETONESUR NEGATIVE 10/29/2024 1730   PROTEINUR 100 (A) 10/29/2024 1730   UROBILINOGEN 0.2 09/06/2018 1057   UROBILINOGEN 0.2 09/12/2015 1225   NITRITE NEGATIVE 10/29/2024 1730   LEUKOCYTESUR NEGATIVE 10/29/2024 1730    Sepsis Labs: Lactic Acid, Venous    Component Value Date/Time   LATICACIDVEN 1.8 10/24/2024 0610    MICROBIOLOGY: No results found for this or any previous visit (from the past 240 hours).  RADIOLOGY STUDIES/RESULTS: IR GASTROSTOMY TUBE MOD SED Result Date: 11/19/2024 INDICATION:  68 year old with dysphagia. Request for a gastrostomy tube placement. EXAM: PERCUTANEOUS GASTROSTOMY TUBE PLACEMENT WITH FLUOROSCOPIC GUIDANCE Physician: Juliene SAUNDERS. Henn, MD MEDICATIONS: Ancef  2 g, glucagon  0.5 mg ANESTHESIA/SEDATION: Moderate (conscious) sedation was employed during this procedure. A total of Versed  2 mg and fentanyl  100 mcg was administered intravenously at the order of the provider performing the procedure. Total intra-service moderate sedation time: 14 minutes. Patient's level of consciousness and vital signs were monitored continuously by radiology nurse throughout the procedure under the supervision of the provider performing the procedure. FLUOROSCOPY: Radiation Exposure Index (as provided by the fluoroscopic device): 7 mGy Kerma COMPLICATIONS: None immediate. PROCEDURE: Informed consent was obtained for a percutaneous gastrostomy tube. The patient was placed on the interventional table. An orogastric tube was placed with fluoroscopic guidance. The anterior abdomen was prepped and draped in sterile fashion. Maximal barrier sterile technique was utilized including caps, mask, sterile gowns, sterile gloves, sterile drape, hand hygiene and skin antiseptic. Stomach was inflated with air through the orogastric tube. The skin and subcutaneous tissues were anesthetized with 1% lidocaine . Saf-T-Pexy T fastener was deployed within the stomach using fluoroscopic guidance. A 0.018 wire was advanced through the needle. Needle was removed and a micropuncture dilator set was placed. Super stiff Amplatz wire was advanced into the stomach. A 9-French vascular sheath was placed and the orogastric tube was snared using a [place] snare device. The orogastric tube and snare were pulled out of the patient's mouth. The snare device was connected to a 20-French gastrostomy tube. The snare device and gastrostomy tube were pulled through the patient's mouth and out the anterior abdominal wall. The gastrostomy tube was  cut to an appropriate length. T fastener was cut. Contrast injection through gastrostomy tube confirmed placement within the stomach. Fluoroscopic images were obtained for documentation. The gastrostomy tube was flushed with normal saline. IMPRESSION: Successful fluoroscopic guided percutaneous gastrostomy tube placement. Electronically Signed   By: Juliene Balder M.D.   On: 11/19/2024 21:04   DG Abd 1 View Result Date: 11/19/2024 CLINICAL DATA:  Evaluate oral contrast prior to gastrostomy tube placement. EXAM: ABDOMEN - 1 VIEW COMPARISON:  CT abdomen 11/13/2024 FINDINGS: Feeding tube extends down the esophagus and terminates in the distal stomach/duodenal bulb region. There is oral contrast in the colon. Right hip replacement. Nonobstructive bowel gas pattern. IMPRESSION: 1. Oral contrast in the colon. 2. Feeding tube terminates in the distal stomach/duodenal bulb region. Electronically Signed   By: Juliene Balder M.D.   On: 11/19/2024 18:51  LOS: 28 days   Donalda Applebaum, MD  [facility]    To contact the attending provider between 7A-7P or the covering provider during after hours 7P-7A, please log into the web site [email or web address] and access using universal [place] password for that web site. If you do not have the password, please call the hospital operator.  11/20/2024, 11:42 AM    "

## 2024-11-19 NOTE — Procedures (Signed)
 Interventional Radiology Procedure:   Indications: Dysphagia  Procedure: Gastrostomy tube placement  Findings: 20 Fr gastrostomy tube placed with fluoroscopy   Complications: None     EBL: Minimal  Plan:  May start using tube in 4 hours   Runa Whittingham R. Philip, MD  Pager: [phone]

## 2024-11-19 NOTE — Progress Notes (Signed)
 " PROGRESS NOTE    Erin [name]  FMW:[id number] DOB: [birth year] DOA: 10/23/2024 PCP: [name] [name], MD   Brief Narrative:  68 year old woman who presented to [facility] 12/3 for elective hernia repair. 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  Patient admitted initially for postop hernia repair found to have hemoperitoneum with hemorrhagic shock postoperatively requiring intubation and pressors.  Her course was subsequently complicated by bilateral strokes thought to be provoked by hypotension.  At this time patient is stabilizing, she has been extubated, continues tube feeds via NG and is otherwise stable from a surgical standpoint.  Transition out of the ICU to hospitalist team on 12/15 with plan for ongoing care with ultimate plan for disposition pending patient's clinical course.  Hospital course 12/3 admitted to ICU postop 12/3-post hernia repair 12/4 taken back to the OR for hemoperitoneum-active bleed was not identified, abdomen packed with wound VAC in place 12/5 back to the OR for an open abdomen-no evidence of ongoing bleeding and abdomen was closed 12/8 - CT Head with interval development (since 04/2024) of multiple cortical and subcortical hypodensities in bilateral cerebellar and cerebral hemispheres suggestive of acute/subacute infarcts. MRI Brain with numerous areas of restricted diffusion throughout the cerebral hemispheres/cerebellum c/f acute infarcts of embolic etiology, associated mild edema (R occipital lobe, L temporal lobe).  12/11 tolerating PSV 5/5 >> extubated 12/15 transition to TRH team - goals of care discussion with husband(see below) 12/17 General surgery evaluating for PEG -pending SLP re-evaluation in hopes she will be able to  improve -LTAC was denied by insurance 12/30 PEG inserted by IR  Assessment & Plan:    Hemorrhagic shock from hypovolemia caused during ex lap hernia repair SHOCK - required stay in ICU, pressors, received massive blood transfusion protocol with 5 units of packed RBC and 2 units of cryoprecipitate around 10/23/2024, unfortunately incurred anoxic brain injury/CVA, brief support with intubation.  Shock has resolved, monitor with supportive care.  Acute bilateral cerebral and cerebellar infarcts, causing encephalopathy, aphasia, dysphagia - CT head with multiple cortical and subcortical hypodensities, confirmed by MRI, likely due to profound hypotension from #1 above, seen by neuro, patient remains minimally verbal, has dysphagia requiring NG tube feeds for 3 weeks.  She was being prepped for PEG tube placement however showed considerable improvement in her swallowing on 11/11/2024, seen by speech and started on oral diet, but overall her oral intake remains poor, so even if she improves regarding dysphagia but she will always have an issue of enough calories/nutrition intake, so she will benefit from PEG as I have discussed with husband, so IR has been reengaged to evaluate for PEG.  Aspirin  need to be on hold for 5 days, so plan for PEG is for next Tuesday - Diet was advanced, currently on dysphagia 3, thin liquid, decreased her tube feeding times to 12 hours/days, has been followed closely by nutritionist to see her calorie intake, but overall is unreliable intake, would be sufficient to maintain her recovery, so plan to proceed with PEG tube . - PEG inserted by IR 12/30, discussed with IR when it is okay to resume aspirin  and subcu heparin . - Okay to resume on a diet and tube feed currently as discussed with IR, but I will keep off tube feed tonight to see how is her appetite  tomorrow, but likely will start on low rate tube feed tomorrow evening.    HTN  - Continue Amlodipine , Carvedilol , added  hydralazine  for better blood pressure control, continue as needed IV hydralazine .  Goal blood pressure is labile but more controlled than not so I will hold on increasing on her scheduled meds.    S/p hernia repair, elective 12/3, S/p ex-lap 12/4, 12/5  - Postoperative management per CCS,  Wound care per surgery   Acute kidney injury to ATN from shock.  History of left renal artery stenosis.  Renal function improving.  Monitor   Acute respiratory insufficiency , extubated 12/11 - happened due to anoxic brain injury/CVA.  Resolved.  HCAP - Enterobacter 12/6  - Completed antibiotic treatment.  History of peripheral neuropathy and itching.  Usually takes high doses of Neurontin  at home, cut down as she has some encephalopathy on 11/09/2024, reduce sedative medications  Mild metabolic encephalopathy.  See above.  Reduce sedating medications including Neurontin .   DVT prophylaxis: SCD's Start: 10/23/24 1918 Code Status:   Code Status: Full Code Family Communication: None at bedside  Status is: Inpt  Dispo: With home health versus SNF when PEG tube is inserted.  Consultants:  PCCM, General surgery, Neuro, IR  Procedures:  Hernia repair 12/3 and ex-lap 12/5   Antimicrobials:  None(completed prior course)   Subjective:   She complains of some pain after her PEG insertion, otherwise asking for something to drink.   Objective: Vitals:   11/19/24 1140 11/19/24 1200 11/19/24 1539 11/19/24 1600  BP: (!) 176/77 (!) 161/74 (!) 159/69 138/60  Pulse:  80  95  Resp:  19  12  Temp: 98.2 F (36.8 C)  98.6 F (37 C)   TempSrc: Oral  Oral   SpO2:  97%  97%  Weight:      Height:        Intake/Output Summary (Last 24 hours) at 11/19/2024 1653 Last data filed at 11/19/2024 0400 Gross per 24 hour  Intake 200 ml  Output 1650 ml  Net -1450 ml   Filed Weights   11/16/24 0500 11/17/24 0500 11/19/24 0500  Weight: 46.3 kg 45.7 kg 44.9 kg    Examination:  Awake, alert, appropriate  and communicative, follows commands Good air entry Regular rate and rhythm Abdomen soft, bandage, PEG placed but site is covered. No Cyanosis, Clubbing or edema, No new Rash or bruise       Data Review:   Patient Lines/Drains/Airways Status     Active Line/Drains/Airways     Name Placement date Placement time Site Days   Peripheral IV 11/18/24 22 G 1 Left;Anterior;Lateral Forearm 11/18/24  0510  Forearm  1   Gastrostomy/Enterostomy Gastrostomy 20 Fr. LUQ 11/19/24  0912  LUQ  less than 1   External Urinary Catheter 11/01/24  1900  --  18   Wound 10/23/24 0923 Surgical Closed Surgical Incision Abdomen 10/23/24  0923  Abdomen  27   Wound 10/23/24 0923 Surgical Laparoscopic Abdomen 1: Upper;Left 2: Left;Lower 10/23/24  0923  Abdomen  27   Wound 11/01/24 1600 Irritant Contact Dermatitis Buttocks 11/01/24  1600  Buttocks  18             Inpatient Medications  Scheduled Meds:  amLODipine   10 mg Per Tube Daily   carvedilol   12.5 mg Per Tube BID WC   feeding supplement (KATE FARMS STANDARD 1.4) Liquid  720 mL Per Tube Q24H   free water   200 mL Per Tube Q6H  gabapentin   100 mg Per Tube Q12H   Gerhardt's butt cream   Topical BID   hydrALAZINE   50 mg Per Tube Q8H   Influenza vac split trivalent PF  0.5 mL Intramuscular Tomorrow-1000   levothyroxine   75 mcg Per Tube Q0600   methylphenidate   5 mg Oral q morning   mouth rinse  15 mL Mouth Rinse 4 times per day   pantoprazole  (PROTONIX ) IV  40 mg Intravenous QHS   Continuous Infusions:   ceFAZolin  (ANCEF ) IV     dextrose  5 % and 0.45 % NaCl 50 mL/hr at 11/18/24 2048    PRN Meds:.acetaminophen , hydrALAZINE , hydrocortisone  cream, labetalol , loperamide , ondansetron  **OR** ondansetron  (ZOFRAN ) IV, mouth rinse, traMADol   DVT Prophylaxis  SCD's Start: 10/23/24 1918   Recent Labs  Lab 11/13/24 0155 11/14/24 1151 11/15/24 0525 11/16/24 0539 11/17/24 0550 11/18/24 0429 11/19/24 0401  WBC 8.2   < > 6.6 6.2 6.3 6.8 7.1  HGB  9.1*   < > 9.1* 9.0* 8.8* 8.6* 9.3*  HCT 28.0*   < > 27.1* 27.4* 27.0* 25.7* 28.7*  PLT 469*   < > 433* 343 337 316 288  MCV 96.9   < > 94.1 95.5 95.1 95.5 95.3  MCH 31.5   < > 31.6 31.4 31.0 32.0 30.9  MCHC 32.5   < > 33.6 32.8 32.6 33.5 32.4  RDW 16.0*   < > 15.7* 15.4 15.2 15.4 15.2  LYMPHSABS 1.2  --   --   --   --   --   --   MONOABS 1.1*  --   --   --   --   --   --   EOSABS 0.1  --   --   --   --   --   --   BASOSABS 0.0  --   --   --   --   --   --    < > = values in this interval not displayed.    Recent Labs  Lab 11/13/24 0155 11/15/24 0525 11/16/24 0539 11/17/24 0550 11/18/24 0429 11/19/24 0401  NA 136 134* 134* 136 136 133*  K 4.6 4.0 4.0 4.3 4.1 4.3  CL 101 98 99 101 100 99  CO2 24 24 23 24 24 22   ANIONGAP 11 11 12 11 12 11   GLUCOSE 108* 114* 117* 104* 113* 104*  BUN 56* 56* 57* 55* 57* 55*  CREATININE 1.50* 1.57* 1.64* 1.49* 1.58* 1.59*  AST 22  --   --   --   --   --   ALT 13  --   --   --   --   --   ALKPHOS 91  --   --   --   --   --   BILITOT 0.2  --   --   --   --   --   ALBUMIN  3.6  --   --   --   --   --   INR  --  1.0  --   --   --  0.9  MG 2.3  --   --   --   --   --   PHOS 4.5  --   --   --   --   --   CALCIUM  9.6 9.2 9.4 9.5 9.5 9.5      Recent Labs  Lab 11/13/24 0155 11/15/24 0525 11/16/24 0539 11/17/24 0550 11/18/24 0429 11/19/24 0401  INR  --  1.0  --   --   --  0.9  MG 2.3  --   --   --   --   --   CALCIUM  9.6 9.2 9.4 9.5 9.5 9.5    --------------------------------------------------------------------------------------------------------------- Lab Results  Component Value Date   CHOL 91 10/29/2024   HDL 12 (L) 10/29/2024   LDLCALC 13 10/29/2024   LDLDIRECT 70.0 04/07/2021   TRIG 331 (H) 10/29/2024   CHOLHDL 7.6 10/29/2024    Lab Results  Component Value Date   HGBA1C 4.9 10/18/2024   No results for input(s): TSH, T4TOTAL, FREET4, T3FREE, THYROIDAB in the last 72 hours. No results for input(s): VITAMINB12,  FOLATE, FERRITIN, TIBC, IRON, RETICCTPCT in the last 72 hours. ------------------------------------------------------------------------------------------------------------------ Cardiac Enzymes No results for input(s): CKMB, TROPONINI, MYOGLOBIN in the last 168 hours.  Invalid input(s): CK  Micro Results No results found for this or any previous visit (from the past 240 hours).   Radiology Reports  No results found.       Signature  -   Brayton Lye M.D on 11/19/2024 at 4:53 PM   -  To page go to [email or web address]     "

## 2024-11-19 NOTE — Plan of Care (Signed)
 " Problem: Education: Goal: Knowledge of General Education information will improve Description: Including pain rating scale, medication(s)/side effects and non-pharmacologic comfort measures Outcome: Progressing   Problem: Health Behavior/Discharge Planning: Goal: Ability to manage health-related needs will improve Outcome: Progressing   Problem: Clinical Measurements: Goal: Ability to maintain clinical measurements within normal limits will improve Outcome: Progressing Goal: Will remain free from infection Outcome: Progressing Goal: Diagnostic test results will improve Outcome: Progressing Goal: Respiratory complications will improve Outcome: Progressing Goal: Cardiovascular complication will be avoided Outcome: Progressing   Problem: Activity: Goal: Risk for activity intolerance will decrease Outcome: Progressing   Problem: Nutrition: Goal: Adequate nutrition will be maintained Outcome: Progressing   Problem: Coping: Goal: Level of anxiety will decrease Outcome: Progressing   Problem: Elimination: Goal: Will not experience complications related to bowel motility Outcome: Progressing Goal: Will not experience complications related to urinary retention Outcome: Progressing   Problem: Pain Managment: Goal: General experience of comfort will improve and/or be controlled Outcome: Progressing   Problem: Safety: Goal: Ability to remain free from injury will improve Outcome: Progressing   Problem: Skin Integrity: Goal: Risk for impaired skin integrity will decrease Outcome: Progressing   Problem: Activity: Goal: Ability to tolerate increased activity will improve Outcome: Progressing   Problem: Respiratory: Goal: Ability to maintain a clear airway and adequate ventilation will improve Outcome: Progressing   Problem: Role Relationship: Goal: Method of communication will improve Outcome: Progressing   Problem: Education: Goal: Knowledge of disease or condition  will improve Outcome: Progressing Goal: Knowledge of secondary prevention will improve (MUST DOCUMENT ALL) Outcome: Progressing Goal: Knowledge of patient specific risk factors will improve (DELETE if not current risk factor) Outcome: Progressing   Problem: Ischemic Stroke/TIA Tissue Perfusion: Goal: Complications of ischemic stroke/TIA will be minimized Outcome: Progressing   Problem: Coping: Goal: Will verbalize positive feelings about self Outcome: Progressing Goal: Will identify appropriate support needs Outcome: Progressing   Problem: Health Behavior/Discharge Planning: Goal: Ability to manage health-related needs will improve Outcome: Progressing Goal: Goals will be collaboratively established with patient/family Outcome: Progressing   Problem: Self-Care: Goal: Ability to participate in self-care as condition permits will improve Outcome: Progressing Goal: Verbalization of feelings and concerns over difficulty with self-care will improve Outcome: Progressing Goal: Ability to communicate needs accurately will improve Outcome: Progressing   Problem: Nutrition: Goal: Risk of aspiration will decrease Outcome: Progressing Goal: Dietary intake will improve Outcome: Progressing   Problem: Education: Goal: Knowledge of disease or condition will improve Outcome: Progressing Goal: Knowledge of secondary prevention will improve (MUST DOCUMENT ALL) Outcome: Progressing Goal: Knowledge of patient specific risk factors will improve (DELETE if not current risk factor) Outcome: Progressing   Problem: Ischemic Stroke/TIA Tissue Perfusion: Goal: Complications of ischemic stroke/TIA will be minimized Outcome: Progressing   Problem: Coping: Goal: Will verbalize positive feelings about self Outcome: Progressing Goal: Will identify appropriate support needs Outcome: Progressing   Problem: Health Behavior/Discharge Planning: Goal: Ability to manage health-related needs will  improve Outcome: Progressing Goal: Goals will be collaboratively established with patient/family Outcome: Progressing   Problem: Self-Care: Goal: Ability to participate in self-care as condition permits will improve Outcome: Progressing Goal: Verbalization of feelings and concerns over difficulty with self-care will improve Outcome: Progressing Goal: Ability to communicate needs accurately will improve Outcome: Progressing   Problem: Nutrition: Goal: Risk of aspiration will decrease Outcome: Progressing Goal: Dietary intake will improve Outcome: Progressing   Problem: Education: Goal: Knowledge of disease or condition will improve Outcome: Progressing Goal: Knowledge of secondary prevention will  improve (MUST DOCUMENT ALL) Outcome: Progressing Goal: Knowledge of patient specific risk factors will improve (DELETE if not current risk factor) Outcome: Progressing   Problem: Ischemic Stroke/TIA Tissue Perfusion: Goal: Complications of ischemic stroke/TIA will be minimized Outcome: Progressing   "

## 2024-11-19 NOTE — NC FL2 (Signed)
 " [place]  MEDICAID FL2 LEVEL OF CARE FORM     IDENTIFICATION  Patient Name: Erin [name] Birthdate: Jul 25, 1956 Sex: female Admission Date (Current Location): 10/23/2024  [facility] and [place] Number:  Producer, Television/film/video and Address:  [facility], [address], [place], [place] [address]      Provider Number: [id number]  Attending Physician Name and Address:  Elgergawy, Brayton RAMAN, MD  Relative Name and Phone Number:       Current Level of Care: Hospital Recommended Level of Care: Skilled Nursing Facility Prior Approval Number:    Date Approved/Denied:   PASRR Number: [id number]  Discharge Plan: SNF    Current Diagnoses: Patient Active Problem List   Diagnosis Date Noted   Protein-calorie malnutrition, severe 11/18/2024   Malnutrition of moderate degree 10/25/2024   S/P hernia repair 10/23/2024   Shock (HCC) 10/23/2024   History of colonic polyps 08/29/2024   Renal artery stenosis 07/02/2024   AKI (acute kidney injury) 01/14/2024   Essential hypertension 09/25/2023   Restless leg syndrome 04/20/2023   Preoperative cardiovascular examination 05/10/2022   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/03/2020   Hyperlipidemia 02/12/2020   Cardiomyopathy (HCC) 11/03/2017   Peripheral vascular disease 11/03/2017   Coronary artery disease involving native coronary artery of native heart without angina pectoris 10/13/2017   Aortoiliac occlusive disease (HCC) 08/14/2017   Hypokalemia 06/06/2016   Family history of early CAD 04/08/2016   Elevated glucose 03/18/2016   B12 deficiency anemia 09/14/2015   Hypothyroidism 09/10/2015   GERD without esophagitis 09/10/2015   BPPV (benign paroxysmal positional vertigo) 07/08/2015   Neck pain 07/08/2015   Anal cancer (HCC) 08/19/2013   IBS (irritable bowel syndrome) 03/29/2013   Chronic daily headache  03/29/2013   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    Orientation RESPIRATION BLADDER Height & Weight     Self  Normal Incontinent, External catheter Weight: 98 lb 15.8 oz (44.9 kg) Height:  5' 3 (160 cm)  BEHAVIORAL SYMPTOMS/MOOD NEUROLOGICAL BOWEL NUTRITION STATUS      Continent Feeding tube (see dc summary)  AMBULATORY STATUS COMMUNICATION OF NEEDS Skin   Extensive Assist Verbally Surgical wounds, Other (Comment) (closed incision on abdomen, dermatitis on buttocks)                       Personal Care Assistance Level of Assistance  Bathing, Feeding, Dressing Bathing Assistance: Maximum assistance Feeding assistance: Maximum assistance Dressing Assistance: Maximum assistance     Functional Limitations Info  Sight Sight Info: Impaired        SPECIAL CARE FACTORS FREQUENCY  PT (By licensed PT), OT (By licensed OT)     PT Frequency: 5x/week OT Frequency: 5x/week            Contractures Contractures Info: Not present    Additional Factors Info  Code Status, Allergies Code Status Info: Full Allergies Info: Latex, Amitiza  (Lubiprostone ), Nortriptyline , Sulfa Antibiotics, Tramadol , Atorvastatin , Claritin (Loratadine)           Current Medications (11/19/2024):  This is the current hospital active medication list Current Facility-Administered Medications  Medication Dose Route Frequency Provider Last Rate Last Admin   acetaminophen  (TYLENOL ) tablet 1,000 mg  1,000 mg Per Tube Q6H PRN Ramirez, Armando, MD   1,000 mg at 11/18/24 0820   amLODipine  (NORVASC ) tablet 10 mg  10 mg Per Tube Daily de Clint Kill, Cortney E,  NP   10 mg at 11/18/24 0820   carvedilol  (COREG ) tablet 12.5 mg  12.5 mg Per Tube BID WC Olalere, Adewale A, MD   12.5 mg at 11/18/24 1730   ceFAZolin  (ANCEF ) IVPB 2g/100 mL premix  2 g Intravenous Once Bonney, Kristi B, NP       dextrose  5 % and 0.45 % NaCl infusion   Intravenous Continuous Elgergawy, Brayton RAMAN, MD 50 mL/hr at 11/18/24 2048 New Bag at 11/18/24  2048   feeding supplement (KATE FARMS STANDARD ENT 1.4) (tube feed) liquid 720 mL  720 mL Per Tube Q24H Elgergawy, Brayton RAMAN, MD   720 mL at 11/18/24 1735   free water  200 mL  200 mL Per Tube Q6H Dennise Salles K, MD   200 mL at 11/18/24 1735   gabapentin  (NEURONTIN ) 250 MG/5ML solution 100 mg  100 mg Per Tube Q12H Singh, Prashant K, MD   100 mg at 11/18/24 2144   Gerhardt's butt cream   Topical BID Dagenhart, Jamie H, NP   Given at 11/18/24 2129   hydrALAZINE  (APRESOLINE ) injection 10 mg  10 mg Intravenous Q4H PRN Singh, Prashant K, MD   10 mg at 11/08/24 9587   hydrALAZINE  (APRESOLINE ) tablet 50 mg  50 mg Per Tube Q8H Singh, Prashant K, MD   50 mg at 11/18/24 2143   hydrocortisone  cream 1 %   Topical TID PRN Elgergawy, Dawood S, MD       Influenza vac split trivalent PF (FLUZONE  HIGH-DOSE) injection 0.5 mL  0.5 mL Intramuscular Tomorrow-1000 Rubin Calamity, MD       labetalol  (NORMODYNE ) injection 20 mg  20 mg Intravenous Q2H PRN Paliwal, Aditya, MD   20 mg at 11/06/24 0536   levothyroxine  (SYNTHROID ) tablet 75 mcg  75 mcg Per Tube Q0600 Rubin Calamity, MD   75 mcg at 11/18/24 0535   loperamide  (IMODIUM ) capsule 2 mg  2 mg Oral PRN Elgergawy, Dawood S, MD   2 mg at 11/17/24 9056   methylphenidate  (RITALIN ) tablet 5 mg  5 mg Oral q morning Lue Elsie BROCKS, MD   5 mg at 11/18/24 9180   ondansetron  (ZOFRAN -ODT) disintegrating tablet 4 mg  4 mg Oral Q6H PRN Ramirez, Armando, MD       Or   ondansetron  (ZOFRAN ) injection 4 mg  4 mg Intravenous Q6H PRN Ramirez, Armando, MD   4 mg at 10/23/24 2023   Oral care mouth rinse  15 mL Mouth Rinse 4 times per day Claudene Toribio BROCKS, MD   15 mL at 11/18/24 2137   Oral care mouth rinse  15 mL Mouth Rinse PRN Claudene Toribio BROCKS, MD       pantoprazole  (PROTONIX ) injection 40 mg  40 mg Intravenous QHS Alghanim, Fahid, MD   40 mg at 11/18/24 2143   traMADol  (ULTRAM ) tablet 50 mg  50 mg Oral Q12H PRN Singh, Prashant K, MD   50 mg at 11/15/24 9493     Discharge  Medications: Please see discharge summary for a list of discharge medications.  Relevant Imaging Results:  Relevant Lab Results:   Additional Information SS#: [id number]  Erin RAMAN Kindle, LCSW     "

## 2024-11-19 NOTE — TOC Progression Note (Signed)
 Transition of Care [facility]) - Progression Note    Patient Details  Name: Erin [name] MRN: [id number] Date of Birth: 11/15/1956  [facility] CM/SW Contact  Inocente GORMAN Kindle, LCSW Phone Number: 11/19/2024, 4:18 PM  Clinical Narrative:    CSW met with patient's spouse and provided SNF bed offers and ratings. He will review and call CSW tomorrow.    Expected Discharge Plan: Skilled Nursing Facility Barriers to Discharge: English As A Second Language Teacher, SNF Pending bed offer, Continued Medical Work up               Expected Discharge Plan and Services In-house Referral: Clinical Social Work Discharge Planning Services: CM Consult Post Acute Care Choice: Skilled Nursing Facility Living arrangements for the past 2 months: Single Family Home Expected Discharge Date: 10/23/24               DME Arranged: N/A DME Agency: NA       HH Arranged: NA HH Agency: NA         Social Drivers of Health (SDOH) Interventions SDOH Screenings   Food Insecurity: No Food Insecurity (10/24/2024)  Housing: Low Risk (10/24/2024)  Transportation Needs: No Transportation Needs (10/24/2024)  Utilities: Not At Risk (10/24/2024)  Alcohol Screen: Low Risk (02/26/2024)  Depression (PHQ2-9): Low Risk (02/26/2024)  Financial Resource Strain: Low Risk (02/26/2024)  Physical Activity: Inactive (02/26/2024)  Social Connections: Socially Integrated (10/24/2024)  Stress: No Stress Concern Present (02/26/2024)  Tobacco Use: Medium Risk (10/23/2024)  Health Literacy: Adequate Health Literacy (02/26/2024)    Readmission Risk Interventions     No data to display

## 2024-11-20 DIAGNOSIS — E43 Unspecified severe protein-calorie malnutrition: Secondary | ICD-10-CM

## 2024-11-20 DIAGNOSIS — E440 Moderate protein-calorie malnutrition: Secondary | ICD-10-CM

## 2024-11-20 DIAGNOSIS — R579 Shock, unspecified: Secondary | ICD-10-CM | POA: Diagnosis not present

## 2024-11-20 DIAGNOSIS — Z8719 Personal history of other diseases of the digestive system: Secondary | ICD-10-CM | POA: Diagnosis not present

## 2024-11-20 DIAGNOSIS — E119 Type 2 diabetes mellitus without complications: Secondary | ICD-10-CM | POA: Diagnosis not present

## 2024-11-20 DIAGNOSIS — Z9889 Other specified postprocedural states: Secondary | ICD-10-CM | POA: Diagnosis not present

## 2024-11-20 LAB — CBC
HCT: 26.8 % — ABNORMAL LOW (ref 36.0–46.0)
Hemoglobin: 8.8 g/dL — ABNORMAL LOW (ref 12.0–15.0)
MCH: 31.1 pg (ref 26.0–34.0)
MCHC: 32.8 g/dL (ref 30.0–36.0)
MCV: 94.7 fL (ref 80.0–100.0)
Platelets: 269 K/uL (ref 150–400)
RBC: 2.83 MIL/uL — ABNORMAL LOW (ref 3.87–5.11)
RDW: 15.3 % (ref 11.5–15.5)
WBC: 8.2 K/uL (ref 4.0–10.5)
nRBC: 0 % (ref 0.0–0.2)

## 2024-11-20 LAB — BASIC METABOLIC PANEL WITH GFR
Anion gap: 11 (ref 5–15)
BUN: 42 mg/dL — ABNORMAL HIGH (ref 8–23)
CO2: 23 mmol/L (ref 22–32)
Calcium: 9.5 mg/dL (ref 8.9–10.3)
Chloride: 100 mmol/L (ref 98–111)
Creatinine, Ser: 1.56 mg/dL — ABNORMAL HIGH (ref 0.44–1.00)
GFR, Estimated: 36 mL/min — ABNORMAL LOW
Glucose, Bld: 108 mg/dL — ABNORMAL HIGH (ref 70–99)
Potassium: 3.8 mmol/L (ref 3.5–5.1)
Sodium: 135 mmol/L (ref 135–145)

## 2024-11-20 LAB — GLUCOSE, CAPILLARY
Glucose-Capillary: 106 mg/dL — ABNORMAL HIGH (ref 70–99)
Glucose-Capillary: 109 mg/dL — ABNORMAL HIGH (ref 70–99)
Glucose-Capillary: 131 mg/dL — ABNORMAL HIGH (ref 70–99)
Glucose-Capillary: 94 mg/dL (ref 70–99)
Glucose-Capillary: 97 mg/dL (ref 70–99)

## 2024-11-20 MED ORDER — PROSOURCE TF20 ENFIT COMPATIBL EN LIQD
60.0000 mL | Freq: Every day | ENTERAL | Status: DC
  Administered 2024-11-21 – 2024-11-25 (×4): 60 mL
  Filled 2024-11-20 (×4): qty 60

## 2024-11-20 MED ORDER — HEPARIN SODIUM (PORCINE) 5000 UNIT/ML IJ SOLN
5000.0000 [IU] | Freq: Three times a day (TID) | INTRAMUSCULAR | Status: DC
  Administered 2024-11-21 – 2024-11-25 (×14): 5000 [IU] via SUBCUTANEOUS
  Filled 2024-11-20 (×14): qty 1

## 2024-11-20 MED ORDER — OSMOLITE 1.5 CAL PO LIQD
1000.0000 mL | ORAL | Status: DC
  Administered 2024-11-20 – 2024-11-21 (×2): 1000 mL
  Filled 2024-11-20 (×3): qty 1000

## 2024-11-20 MED ORDER — ROSUVASTATIN CALCIUM 20 MG PO TABS
40.0000 mg | ORAL_TABLET | Freq: Every day | ORAL | Status: DC
  Administered 2024-11-20 – 2024-11-25 (×6): 40 mg via ORAL
  Filled 2024-11-20 (×6): qty 2

## 2024-11-20 MED ORDER — ASPIRIN 81 MG PO TBEC
81.0000 mg | DELAYED_RELEASE_TABLET | Freq: Every day | ORAL | Status: DC
  Administered 2024-11-20 – 2024-11-25 (×6): 81 mg via ORAL
  Filled 2024-11-20 (×6): qty 1

## 2024-11-20 NOTE — Progress Notes (Signed)
 Nutrition Follow-up  DOCUMENTATION CODES:   Severe malnutrition in context of chronic illness  INTERVENTION:  Modify TF via Cortrak to cyclic feeding (6PM-6AM): -Osmolite1.5 at 60 ml/h (720 ml per day) May initiate at goal rate -Continue Pro-Source TF20 60 mL daily    -Free water  flushes 200 ml q6h (800 ml per day) -Provides 1080 kcal (72% estimated needs), 65 gm protein (87% estimated needs), 549 ml free water  daily (1349 mL total TF + FWF)   Continue Magic cup TID with meals, each supplement provides 290 kcal and 9 grams of protein    Continue to monitor intake post d/c to assess need for continued nutrition support    If able may modify to bolus schedule once tolerating. Based on PO intake, would need: 3 boluses (237 ml) Osmolite1.5 daily  Provides 1065 kcals per day (71% estimated needs), 45g protein (60% estimated needs), 543 ml free water  (TF only)   NUTRITION DIAGNOSIS:  Severe Malnutrition related to chronic illness as evidenced by severe muscle depletion, severe fat depletion. - remains applicable   GOAL:  Patient will meet greater than or equal to 90% of their needs - progressing  MONITOR:  PO intake, Supplement acceptance, Diet advancement, TF tolerance, Weight trends  REASON FOR ASSESSMENT:   Consult Enteral/tube feeding initiation and management (Trickle TF)  ASSESSMENT:  68 y.o. female with PMH GERD. IBS, CAD, HTN who presented for elective hernia repair.  12/3 s/p hernia repair; became hypotensive after surgery requiring vasopressors, transferred to the ICU 12/4 returned to OR for ex-lap, abdominal packing and wound vac application; NGT placed; returned to ICU intubated 12/5 returned to OR for open abdomen - no evidence of ongoing bleeding and abdomen closed  12/7 tube feedings started  12/9 transfer to [facility] ICU 12/11 extubated  12/15 transfer to [facility] - GOC discussion w/ husband: full scope 12/17 transfer out of ICU; MBS: NPO; general surgery eval for  PEG 12/18 modify to [employer] TF formula  12/22 MBS: diet advanced to full liquid diet (thins) 12/24 modify to cyclic feedings (16 hours per day) 12/26 - modify to 12 hour run time, per MD; calorie count started 12/29 - calorie count ended: insufficient  12/30 - PEG placement 12/31 - diet advanced to regular; reinitiate nocturnal TF via modified formula in preparation for d/c   Patient diet advanced to regular today. PEG placed yesterday. Plan to modify TF formula in preparation for discharge to assess tolerance. If time allows, bolus recommendations left above. Otherwise, may modify to bolus schedule, if indicated, at rehab facility as pump will likely not be covered for home use.  Average Meal Intake 12/27: 25-100% x2 documented offerings 12/28: 50-65% x2 documented offerings 12/29: 30% x1 documented offering  Met with patient and husband this morning. Patient sitting up in chair, but dozing off during interaction. Dicussed formula change with husband as well as potential to modify to bolus schedule. Education provided around flexibility of bolus schedule should patient's intake significantly improve and noted recent improvement in documented meal intake. NOTE: WILL NEED RE-ASSESSMENT OF INTAKE POST D/C TO REHAB TO ASSESS NEED FOR MODIFICATION OF PROPOSED TTF REGIMEN.  Discussed, with husband, the possibility for as needed boluses in the future or possible removal of PEG altogether, should intake consistently improve enough for patient to meet her needs. Noted breakfast tray at bedside with approximately 10% consumed. Intake remains variable and patient not amicable to any ONS besides [employer]. Educated husband regarding need for continued adequate calorie/protein intake  to promote progress with therapy. He verbalizes understanding and is in agreement.  Admit Weight: 44.4 kg Current Weight: 44.9 kg   Weight stable. No significant edema on exam.  Bowels moving/stable. No edema noted.    Drains/Lines: LUQ: PEG (20Fr) placed 12/30  Meds: levothyroxine , pantoprazole , IV ABX   BUN/Crt stable and marginally elevated. Electrolytes have stabilized.   Labs:  Na+ 135 (wdl) K+ 3.8 (wdl) BUN 42 Crt 1.56 CBGs 02-851 x24 hours A1c 4.9 (09/2024)   Diet Order:   Diet Order             Diet regular Room service appropriate? Yes with Assist; Fluid consistency: Thin  Diet effective now           Diet - low sodium heart healthy             EDUCATION NEEDS:   Not appropriate for education at this time  Skin:  Skin Assessment: Skin Integrity Issues: Skin Integrity Issues:: Incisions Incisions: Surgical Abdomen  Last BM:  12/29  Height:  Ht Readings from Last 1 Encounters:  10/25/24 5' 3 (1.6 m)   Weight:  Wt Readings from Last 1 Encounters:  11/19/24 44.9 kg   Ideal Body Weight:  52.3 kg  BMI:  Body mass index is 17.53 kg/m.  Estimated Nutritional Needs:   Kcal:  1500-1700  Protein:  75-90 grams  Fluid:  >/= 1.5L  Blair Deaner MS, RD, LDN Registered Dietitian I Clinical Nutrition RD Inpatient Contact Info in Amion

## 2024-11-20 NOTE — Progress Notes (Signed)
 Speech Language Pathology Treatment: Dysphagia;Cognitive-Linguistic  Patient Details Name: Erin [name] MRN: [id number] DOB: 21-Apr-1956 Today's Date: 11/20/2024 Time: 8866-8847 SLP Time Calculation (min) (ACUTE ONLY): 19 min  Assessment / Plan / Recommendation Clinical Impression  Texture upgraded to regular- has met dysphagia goals; continued tx for cognitive-linguistic deficits.   Pt's dentures are here in hospital but he and pt confirmed she typically does not wear when eating. She states she has consumed Dys 3 without difficulty although intake is low and received PEG yesterday. She continues to exhibit efficient oral phase with trials upgraded texture and pt/spouse in agreement to upgrade to regular with increase choices of allowed foods. Initially straws not recommended on past MBS as a precaution however she has been using them without difficulty per husband and observed today without s/s aspiration.  Processing was less delayed today and named mildly more abstract objects requiring only one phrase completion cue from husband to 90% accuracy. She had one phonemic paraphasia with awareness of error and self corrected with min prompt. Exhibits increased difficulty with divergent naming with semantic assist needed for 3 items listed. Encouraged husband to allow pt extra time to generate word and provided semantic or phrase completion cue if needed.    HPI HPI: 68 yo F adm to [facility] 12/3 for ventral hernia repair. 12/4 ex lap with abd hemorrhage, remained intubated post op. 12/5 abdominal closure. 2/8 MRI: Numerous scattered areas of restricted diffusion throughout the cerebral  hemispheres and cerebellum concerning for acute infarcts of embolic etiology, associated mild edema, particularly within the right occipital lobe and left  temporal lobe. 12/9 transfer to [facility]. 12/11 extubated. BSE 11/01/24: NPO, ice chips after oral care. MBS 12/17: silent aspiration, DNFC; NPO. Significant issues with  attn/cog. SLP following for cognitive-linguistic deficits and dysphagia. PMHx: anal CA, hypothyroidism, GERD, CAD, cardiomyopathy, PVD, HLD, HTN, RLS. Per 2023 MBS, mild pharyngeal dysphagia and suspected primary esophageal dysphagia. MBSS completed on 11/11/24 showed mild oropharygneal dysphagia with recommendations of a full liquid/thin liquid diet.      SLP Plan  Other (Comment) (swallow goal met, continue for cog-ling)        Swallow Evaluation Recommendations   Recommendations: PO diet PO Diet Recommendation: Regular;Thin liquids (Level 0) Liquid Administration via: Straw;Cup Medication Administration: Whole meds with liquid Supervision: Patient able to self-feed;Intermittent supervision/cueing for swallowing strategies Postural changes: Position pt fully upright for meals Oral care recommendations: Oral care BID (2x/day)     Recommendations                     Oral care BID   Intermittent Supervision/Assistance Dysphagia, unspecified (R13.10);Cognitive communication deficit (R41.841)     Other (Comment) (swallow goal met, continue for cog-ling)     Dustin, Olam Bull  11/20/2024, 1:18 PM

## 2024-11-20 NOTE — Progress Notes (Addendum)
 Physical Therapy Treatment Patient Details Name: Erin [name] MRN: [id number] DOB: 07-Aug-1956 Today's Date: 11/20/2024   History of Present Illness 68 y.o. F adm to [facility] 12/3 for ventral hernia repair. 12/4 ex lap with abdominal hemorrhage and remained intubated post op. 12/5 abdominal closure. 12/8 MRI with multiple embolic infarcts. 12/9 transfer to [facility]. 12/11 extubated. PMHx: anal CA, hypothyroidism, GERD, CAD, cardiomyopathy, PVD, HLD, HTN, RLS.    PT Comments  Pt received in supine and agreeable to session. Pt demonstrates good progress towards functional mobility goals with improved command following and reduced assist needed. Pt able to perform bed mobility and transfers with min A. Pt able to increase gait distance with min A and 1 seated rest break due to fatigue. Pt requires cues for problem solving and sequencing throughout. Pt demonstrates improved verbalizations, but word finding difficulties noted. Pt motivated to increase functional independence and husband supportive throughout. Pt continues to benefit from PT services to progress toward functional mobility goals.   Addendum: Based on pt's progress towards mobility goals and improved cognition, discharge recommendations updated to intensive inpatient follow-up therapy, >3 hours/day to maximize progress prior to return home after discussion with supervising PT Ashlyn L.   If plan is discharge home, recommend the following: A little help with walking and/or transfers;A lot of help with bathing/dressing/bathroom;Assistance with cooking/housework;Assistance with feeding;Direct supervision/assist for medications management;Direct supervision/assist for financial management;Assist for transportation;Help with stairs or ramp for entrance;Supervision due to cognitive status   Can travel by private vehicle        Equipment Recommendations  Rolling walker (2 wheels);BSC/3in1;Wheelchair (measurements PT);Hospital bed;Wheelchair cushion  (measurements PT)    Recommendations for Other Services       Precautions / Restrictions Precautions Precautions: Fall Recall of Precautions/Restrictions: Impaired Precaution/Restrictions Comments: PEG Restrictions Weight Bearing Restrictions Per Provider Order: No     Mobility  Bed Mobility Overal bed mobility: Needs Assistance Bed Mobility: Supine to Sit     Supine to sit: Min assist, Used rails     General bed mobility comments: Cues for sequencing and technique. light Min A for trunk elevation and sitting balance    Transfers Overall transfer level: Needs assistance Equipment used: Rolling walker (2 wheels) Transfers: Sit to/from Stand Sit to Stand: Min assist           General transfer comment: STS from EOB and recliner with min A for power up and anterior weight shift. Initial posterior lean, but pt able to correct with cues    Ambulation/Gait Ambulation/Gait assistance: Min assist, +2 safety/equipment Gait Distance (Feet): 15 Feet (+10) Assistive device: Rolling walker (2 wheels) Gait Pattern/deviations: Narrow base of support, Trunk flexed, Step-to pattern, Decreased stride length       General Gait Details: Pt demonstrates short steps with narrow BOS and low foot clearance. Pt able to improve step length and widen BOS with frequent cues. Improved upright posture with facilitation. 1 seated rest break due to fatigue   Stairs             Wheelchair Mobility     Tilt Bed    Modified Rankin (Stroke Patients Only) Modified Rankin (Stroke Patients Only) Pre-Morbid Rankin Score: No symptoms Modified Rankin: Moderately severe disability     Balance Overall balance assessment: Needs assistance Sitting-balance support: Bilateral upper extremity supported Sitting balance-Leahy Scale: Poor Sitting balance - Comments: CGA for static sitting EOB with anterior lean, but pt able to correct with cues.   Standing balance support: Bilateral upper  extremity  supported, During functional activity, Reliant on assistive device for balance Standing balance-Leahy Scale: Poor Standing balance comment: reliant on RW and external support                            Communication Communication Communication: Impaired Factors Affecting Communication: Difficulty expressing self (word finding difficulty)  Cognition Arousal: Alert Behavior During Therapy: WFL for tasks assessed/performed   PT - Cognitive impairments: Awareness, Safety/Judgement, Problem solving, Sequencing, Attention                         Following commands: Impaired Following commands impaired: Follows one step commands with increased time    Cueing Cueing Techniques: Verbal cues, Tactile cues, Visual cues  Exercises      General Comments General comments (skin integrity, edema, etc.): Husband present and supportive throughout      Pertinent Vitals/Pain Pain Assessment Pain Assessment: No/denies pain     PT Goals (current goals can now be found in the care plan section) Acute Rehab PT Goals Patient Stated Goal: No additional goals stated during session. PT Goal Formulation: With family Time For Goal Achievement: 11/13/24 Progress towards PT goals: Progressing toward goals    Frequency    Min 3X/week       AM-PAC PT 6 Clicks Mobility   Outcome Measure  Help needed turning from your back to your side while in a flat bed without using bedrails?: None Help needed moving from lying on your back to sitting on the side of a flat bed without using bedrails?: A Little Help needed moving to and from a bed to a chair (including a wheelchair)?: A Little Help needed standing up from a chair using your arms (e.g., wheelchair or bedside chair)?: A Little Help needed to walk in hospital room?: A Lot Help needed climbing 3-5 steps with a railing? : Total 6 Click Score: 16    End of Session Equipment Utilized During Treatment: Gait  belt Activity Tolerance: Patient limited by fatigue;Patient tolerated treatment well Patient left: in chair;with chair alarm set;with call bell/phone within reach;with family/visitor present Nurse Communication: Mobility status PT Visit Diagnosis: Unsteadiness on feet (R26.81);Muscle weakness (generalized) (M62.81) Hemiplegia - Right/Left: Left Hemiplegia - dominant/non-dominant: Non-dominant Hemiplegia - caused by: Cerebral infarction     Time: 8967-8897 PT Time Calculation (min) (ACUTE ONLY): 30 min  Charges:    $Gait Training: 8-22 mins $Therapeutic Activity: 8-22 mins PT General Charges $$ ACUTE PT VISIT: 1 Visit                    Darryle George, PTA Acute Rehabilitation Services Secure Chat Preferred  [phone]    Darryle George 11/20/2024, 11:26 AM

## 2024-11-20 NOTE — Plan of Care (Signed)
 " Problem: Education: Goal: Knowledge of General Education information will improve Description: Including pain rating scale, medication(s)/side effects and non-pharmacologic comfort measures 11/20/2024 0631 by Hanley Clem KIDD, RN Outcome: Progressing 11/19/2024 2003 by Hanley Clem KIDD, RN Outcome: Progressing   Problem: Health Behavior/Discharge Planning: Goal: Ability to manage health-related needs will improve 11/20/2024 0631 by Hanley Clem KIDD, RN Outcome: Progressing 11/19/2024 2003 by Hanley Clem KIDD, RN Outcome: Progressing   Problem: Clinical Measurements: Goal: Ability to maintain clinical measurements within normal limits will improve 11/20/2024 0631 by Hanley Clem KIDD, RN Outcome: Progressing 11/19/2024 2003 by Hanley Clem KIDD, RN Outcome: Progressing Goal: Will remain free from infection 11/20/2024 0631 by Hanley Clem KIDD, RN Outcome: Progressing 11/19/2024 2003 by Hanley Clem KIDD, RN Outcome: Progressing Goal: Diagnostic test results will improve 11/20/2024 0631 by Hanley Clem KIDD, RN Outcome: Progressing 11/19/2024 2003 by Hanley Clem KIDD, RN Outcome: Progressing Goal: Respiratory complications will improve 11/20/2024 0631 by Hanley Clem KIDD, RN Outcome: Progressing 11/19/2024 2003 by Hanley Clem KIDD, RN Outcome: Progressing Goal: Cardiovascular complication will be avoided 11/20/2024 0631 by Hanley Clem KIDD, RN Outcome: Progressing 11/19/2024 2003 by Hanley Clem KIDD, RN Outcome: Progressing   Problem: Activity: Goal: Risk for activity intolerance will decrease 11/20/2024 0631 by Hanley Clem KIDD, RN Outcome: Progressing 11/19/2024 2003 by Hanley Clem KIDD, RN Outcome: Progressing   Problem: Nutrition: Goal: Adequate nutrition will be maintained 11/20/2024 0631 by Hanley Clem KIDD, RN Outcome: Progressing 11/19/2024 2003 by Hanley Clem KIDD, RN Outcome: Progressing   Problem: Coping: Goal: Level of anxiety will decrease 11/20/2024 0631 by Hanley Clem KIDD, RN Outcome: Progressing 11/19/2024 2003 by Hanley Clem KIDD, RN Outcome: Progressing   Problem: Elimination: Goal: Will not experience complications related to bowel motility 11/20/2024 0631 by Hanley Clem KIDD, RN Outcome: Progressing 11/19/2024 2003 by Hanley Clem KIDD, RN Outcome: Progressing Goal: Will not experience complications related to urinary retention 11/20/2024 0631 by Hanley Clem KIDD, RN Outcome: Progressing 11/19/2024 2003 by Hanley Clem KIDD, RN Outcome: Progressing   Problem: Pain Managment: Goal: General experience of comfort will improve and/or be controlled 11/20/2024 0631 by Hanley Clem KIDD, RN Outcome: Progressing 11/19/2024 2003 by Hanley Clem KIDD, RN Outcome: Progressing   Problem: Safety: Goal: Ability to remain free from injury will improve 11/20/2024 0631 by Hanley Clem KIDD, RN Outcome: Progressing 11/19/2024 2003 by Hanley Clem KIDD, RN Outcome: Progressing   Problem: Skin Integrity: Goal: Risk for impaired skin integrity will decrease 11/20/2024 0631 by Hanley Clem KIDD, RN Outcome: Progressing 11/19/2024 2003 by Hanley Clem KIDD, RN Outcome: Progressing   Problem: Activity: Goal: Ability to tolerate increased activity will improve 11/20/2024 0631 by Hanley Clem KIDD, RN Outcome: Progressing 11/19/2024 2003 by Hanley Clem KIDD, RN Outcome: Progressing   Problem: Respiratory: Goal: Ability to maintain a clear airway and adequate ventilation will improve 11/20/2024 0631 by Hanley Clem KIDD, RN Outcome: Progressing 11/19/2024 2003 by Hanley Clem KIDD, RN Outcome: Progressing   Problem: Role Relationship: Goal: Method of communication will improve 11/20/2024 0631 by Hanley Clem KIDD, RN Outcome: Progressing 11/19/2024 2003 by Hanley Clem KIDD, RN Outcome: Progressing   Problem: Education: Goal: Knowledge of disease or condition will improve 11/20/2024 0631 by Hanley Clem KIDD, RN Outcome: Progressing 11/19/2024 2003 by Hanley Clem KIDD, RN Outcome: Progressing Goal: Knowledge of secondary prevention will improve (MUST DOCUMENT ALL) 11/20/2024 0631 by Hanley Clem KIDD, RN Outcome: Progressing 11/19/2024 2003 by Hanley Clem KIDD, RN Outcome: Progressing Goal: Knowledge of patient specific risk factors will  improve (DELETE if not current risk factor) 11/20/2024 0631 by Hanley Clem KIDD, RN Outcome: Progressing 11/19/2024 2003 by Hanley Clem KIDD, RN Outcome: Progressing   Problem: Ischemic Stroke/TIA Tissue Perfusion: Goal: Complications of ischemic stroke/TIA will be minimized 11/20/2024 0631 by Hanley Clem KIDD, RN Outcome: Progressing 11/19/2024 2003 by Hanley Clem KIDD, RN Outcome: Progressing   Problem: Coping: Goal: Will verbalize positive feelings about self 11/20/2024 0631 by Hanley Clem KIDD, RN Outcome: Progressing 11/19/2024 2003 by Hanley Clem KIDD, RN Outcome: Progressing Goal: Will identify appropriate support needs 11/20/2024 0631 by Hanley Clem KIDD, RN Outcome: Progressing 11/19/2024 2003 by Hanley Clem KIDD, RN Outcome: Progressing   Problem: Health Behavior/Discharge Planning: Goal: Ability to manage health-related needs will improve 11/20/2024 0631 by Hanley Clem KIDD, RN Outcome: Progressing 11/19/2024 2003 by Hanley Clem KIDD, RN Outcome: Progressing Goal: Goals will be collaboratively established with patient/family 11/20/2024 0631 by Hanley Clem KIDD, RN Outcome: Progressing 11/19/2024 2003 by Hanley Clem KIDD, RN Outcome: Progressing   Problem: Self-Care: Goal: Ability to participate in self-care as condition permits will improve 11/20/2024 0631 by Hanley Clem KIDD, RN Outcome: Progressing 11/19/2024 2003 by Hanley Clem KIDD, RN Outcome: Progressing Goal: Verbalization of feelings and concerns over difficulty with self-care will improve 11/20/2024 0631 by Hanley Clem KIDD, RN Outcome: Progressing 11/19/2024 2003 by Hanley Clem KIDD, RN Outcome: Progressing Goal: Ability to communicate  needs accurately will improve 11/20/2024 0631 by Hanley Clem KIDD, RN Outcome: Progressing 11/19/2024 2003 by Hanley Clem KIDD, RN Outcome: Progressing   Problem: Nutrition: Goal: Risk of aspiration will decrease 11/20/2024 0631 by Hanley Clem KIDD, RN Outcome: Progressing 11/19/2024 2003 by Hanley Clem KIDD, RN Outcome: Progressing Goal: Dietary intake will improve 11/20/2024 0631 by Hanley Clem KIDD, RN Outcome: Progressing 11/19/2024 2003 by Hanley Clem KIDD, RN Outcome: Progressing   Problem: Education: Goal: Knowledge of disease or condition will improve 11/20/2024 0631 by Hanley Clem KIDD, RN Outcome: Progressing 11/19/2024 2003 by Hanley Clem KIDD, RN Outcome: Progressing Goal: Knowledge of secondary prevention will improve (MUST DOCUMENT ALL) 11/20/2024 0631 by Hanley Clem KIDD, RN Outcome: Progressing 11/19/2024 2003 by Hanley Clem KIDD, RN Outcome: Progressing Goal: Knowledge of patient specific risk factors will improve (DELETE if not current risk factor) 11/20/2024 0631 by Hanley Clem KIDD, RN Outcome: Progressing 11/19/2024 2003 by Hanley Clem KIDD, RN Outcome: Progressing   Problem: Ischemic Stroke/TIA Tissue Perfusion: Goal: Complications of ischemic stroke/TIA will be minimized 11/20/2024 0631 by Hanley Clem KIDD, RN Outcome: Progressing 11/19/2024 2003 by Hanley Clem KIDD, RN Outcome: Progressing   Problem: Coping: Goal: Will verbalize positive feelings about self 11/20/2024 0631 by Hanley Clem KIDD, RN Outcome: Progressing 11/19/2024 2003 by Hanley Clem KIDD, RN Outcome: Progressing Goal: Will identify appropriate support needs 11/20/2024 0631 by Hanley Clem KIDD, RN Outcome: Progressing 11/19/2024 2003 by Hanley Clem KIDD, RN Outcome: Progressing   Problem: Health Behavior/Discharge Planning: Goal: Ability to manage health-related needs will improve 11/20/2024 0631 by Hanley Clem KIDD, RN Outcome: Progressing 11/19/2024 2003 by Hanley Clem KIDD, RN Outcome:  Progressing Goal: Goals will be collaboratively established with patient/family 11/20/2024 0631 by Hanley Clem KIDD, RN Outcome: Progressing 11/19/2024 2003 by Hanley Clem KIDD, RN Outcome: Progressing   Problem: Self-Care: Goal: Ability to participate in self-care as condition permits will improve 11/20/2024 0631 by Hanley Clem KIDD, RN Outcome: Progressing 11/19/2024 2003 by Hanley Clem KIDD, RN Outcome: Progressing Goal: Verbalization of feelings and concerns over difficulty with self-care will improve 11/20/2024 0631 by Hanley Clem KIDD, RN Outcome: Progressing  11/19/2024 2003 by Hanley Clem KIDD, RN Outcome: Progressing Goal: Ability to communicate needs accurately will improve 11/20/2024 0631 by Hanley Clem KIDD, RN Outcome: Progressing 11/19/2024 2003 by Hanley Clem KIDD, RN Outcome: Progressing   Problem: Nutrition: Goal: Risk of aspiration will decrease 11/20/2024 0631 by Hanley Clem KIDD, RN Outcome: Progressing 11/19/2024 2003 by Hanley Clem KIDD, RN Outcome: Progressing Goal: Dietary intake will improve 11/20/2024 0631 by Hanley Clem KIDD, RN Outcome: Progressing 11/19/2024 2003 by Hanley Clem KIDD, RN Outcome: Progressing   Problem: Education: Goal: Knowledge of disease or condition will improve 11/20/2024 0631 by Hanley Clem KIDD, RN Outcome: Progressing 11/19/2024 2003 by Hanley Clem KIDD, RN Outcome: Progressing Goal: Knowledge of secondary prevention will improve (MUST DOCUMENT ALL) 11/20/2024 0631 by Hanley Clem KIDD, RN Outcome: Progressing 11/19/2024 2003 by Hanley Clem KIDD, RN Outcome: Progressing Goal: Knowledge of patient specific risk factors will improve (DELETE if not current risk factor) 11/20/2024 0631 by Hanley Clem KIDD, RN Outcome: Progressing 11/19/2024 2003 by Hanley Clem KIDD, RN Outcome: Progressing   Problem: Ischemic Stroke/TIA Tissue Perfusion: Goal: Complications of ischemic stroke/TIA will be minimized 11/20/2024 0631 by Hanley Clem KIDD,  RN Outcome: Progressing 11/19/2024 2003 by Hanley Clem KIDD, RN Outcome: Progressing   "

## 2024-11-20 NOTE — Progress Notes (Signed)

## 2024-11-20 NOTE — Progress Notes (Addendum)
 "   Referring Physician(s): Elgergawy, Brayton, MD   Supervising Physician: Johann Sieving  Patient Status:  [facility] - In-pt  Chief Complaint:  S/p G tube placement 12/30 by Dr. Philip (20Fr pull through)  Subjective:  Patient is lying in bed with husband at bedside. RN also at bedside providing pain medication. Patient reports increased pain today left abd, difficult to differentiate from site of recent hernia repair. She did notably experience complication of hemoperitoneum after this. She did use tube for feedings last night without documented issues by night RN or dietician today.    Allergies: Latex, Amitiza  [lubiprostone ], Nortriptyline , Sulfa antibiotics, Tramadol , Atorvastatin , and Claritin [loratadine]  Medications: Prior to Admission medications  Medication Sig Start Date End Date Taking? Authorizing Provider  acetaminophen  (TYLENOL ) 500 MG tablet Take 1,000 mg by mouth every 6 (six) hours as needed for headache (pain).   Yes [provider]  acyclovir  ointment (ZOVIRAX ) 5 % Apply 1 Application topically every 3 (three) hours as needed (fever blisters). 10/24/22  Yes [name] HERO, MD  amLODipine  (NORVASC ) 5 MG tablet Take 10 mg by mouth daily. 09/12/24  Yes [provider]  aspirin  EC 81 MG tablet Take 1 tablet (81 mg total) by mouth daily. Swallow whole. 06/19/24  Yes Weaver, Scott T, PA-C  carvedilol  (COREG ) 12.5 MG tablet Take 12.5 mg by mouth 2 (two) times daily. 09/12/24  Yes [provider]  Cholecalciferol (VITAMIN D ) 2000 units tablet Take 2,000 Units by mouth daily.   Yes [provider]  clopidogrel  (PLAVIX ) 75 MG tablet Take 1 tablet (75 mg total) by mouth daily. 08/12/24  Yes Serene Gaile ORN, MD  DULoxetine  (CYMBALTA ) 20 MG capsule TAKE 2-3 CAPSULES BY MOUTH EVERY DAY 02/26/24  Yes [name] HERO, MD  fluticasone  (FLONASE ) 50 MCG/ACT nasal spray SPRAY 1 SPRAY INTO EACH NOSTRIL DAILY 02/17/24  Yes [name] HERO, MD   gabapentin  (NEURONTIN ) 600 MG tablet TAKE 1 TABLET BY MOUTH THREE TIMES A DAY 05/29/24  Yes [name] HERO, MD  ketoconazole  (NIZORAL ) 2 % shampoo Apply to hair daily for 7 days -- leave on for 5 minutes, then wash every other day for 7 days, then wash twice a week for 7 days,  then once a week. 04/01/24  Yes [name] HERO, MD  levothyroxine  (SYNTHROID ) 75 MCG tablet TAKE 1 TABLET BY MOUTH DAILY BEFORE BREAKFAST MONDAY-FRIDAY, THEN 1/2 SATURDAY AND SUNDAY 06/28/24  Yes [name] HERO, MD  metoCLOPramide  (REGLAN ) 5 MG tablet TAKE 1 TABLET BY MOUTH EVERY 6 HOURS AS NEEDED FOR NAUSEA. 08/12/24  Yes [name] HERO, MD  nitroGLYCERIN  (NITROSTAT ) 0.4 MG SL tablet Place 1 tablet (0.4 mg total) under the tongue every 5 (five) minutes as needed for chest pain. 04/03/24 10/15/24 Yes End, Lonni, MD  nystatin -triamcinolone  (MYCOLOG II) cream Apply 1 application topically 2 (two) times daily as needed (dry skin).  03/30/16  Yes [provider]  oxyCODONE -acetaminophen  (PERCOCET) 5-325 MG tablet Take 1 tablet by mouth every 4 (four) hours as needed for severe pain (pain score 7-10). 10/23/24 10/23/25 Yes Rubin Calamity, MD  pantoprazole  (PROTONIX ) 40 MG tablet TAKE 1 TABLET BY MOUTH TWICE A DAY 08/12/24  Yes McGreal, Inocente HERO, MD  potassium chloride  SA (KLOR-CON  M20) 20 MEQ tablet Take 1 tablet (20 mEq total) by mouth 2 (two) times daily. 06/24/24  Yes Weaver, Scott T, PA-C  rOPINIRole  (REQUIP ) 1 MG tablet Take 1 tablet (1 mg total) by mouth at bedtime. May Increase to 2 tablet  at bedtime after 7 days if needed. 09/13/24  Yes [name] HERO, MD  rosuvastatin  (CRESTOR ) 40 MG tablet Take 1 tablet (40 mg total) by mouth daily. 09/13/24  Yes [name] HERO, MD  tiZANidine (ZANAFLEX) 4 MG tablet Take 2 mg by mouth 3 (three) times daily. 12/11/20  Yes [provider]  topiramate  (TOPAMAX ) 50 MG tablet Take 50 mg by mouth 2 (two) times daily.   Yes [provider]  vitamin B-12  1000 MCG tablet Take 1 tablet (1,000 mcg total) by mouth daily. 09/14/15  Yes Dhungel, Nishant, MD  hydrALAZINE  (APRESOLINE ) 25 MG tablet Take 1 tab once daily as needed for elevated BP (>160/95) 07/17/24   Lelon, Glendia T, PA-C  meclizine  (ANTIVERT ) 25 MG tablet TAKE 1 TABLET BY MOUTH THREE TIMES A DAY AS NEEDED FOR DIZZINESS 05/01/24   [name] HERO, MD     Vital Signs: BP 130/62 (BP Location: Right Arm)   Pulse 78   Temp 98 F (36.7 C) (Axillary)   Resp 14   Ht 5' 3 (1.6 m)   Wt 98 lb 15.8 oz (44.9 kg)   SpO2 98%   BMI 17.53 kg/m   Physical Exam HENT:     Mouth/Throat:     Mouth: Mucous membranes are moist.     Pharynx: Oropharynx is clear.  Pulmonary:     Effort: Pulmonary effort is normal.  Abdominal:     General: There is no distension.     Palpations: Abdomen is soft.     Tenderness: There is no abdominal tenderness.     Comments: G tube present with scant primarily old blood present under bumper. Appropriately tender to palpate. Well healing L abd incision.   Neurological:     Mental Status: She is alert.     Imaging: IR GASTROSTOMY TUBE MOD SED Result Date: 11/19/2024 INDICATION: 67 year old with dysphagia. Request for a gastrostomy tube placement. EXAM: PERCUTANEOUS GASTROSTOMY TUBE PLACEMENT WITH FLUOROSCOPIC GUIDANCE Physician: Juliene SAUNDERS. Henn, MD MEDICATIONS: Ancef  2 g, glucagon  0.5 mg ANESTHESIA/SEDATION: Moderate (conscious) sedation was employed during this procedure. A total of Versed  2 mg and fentanyl  100 mcg was administered intravenously at the order of the provider performing the procedure. Total intra-service moderate sedation time: 14 minutes. Patient's level of consciousness and vital signs were monitored continuously by radiology nurse throughout the procedure under the supervision of the provider performing the procedure. FLUOROSCOPY: Radiation Exposure Index (as provided by the fluoroscopic device): 7 mGy Kerma COMPLICATIONS: None immediate.  PROCEDURE: Informed consent was obtained for a percutaneous gastrostomy tube. The patient was placed on the interventional table. An orogastric tube was placed with fluoroscopic guidance. The anterior abdomen was prepped and draped in sterile fashion. Maximal barrier sterile technique was utilized including caps, mask, sterile gowns, sterile gloves, sterile drape, hand hygiene and skin antiseptic. Stomach was inflated with air through the orogastric tube. The skin and subcutaneous tissues were anesthetized with 1% lidocaine . Saf-T-Pexy T fastener was deployed within the stomach using fluoroscopic guidance. A 0.018 wire was advanced through the needle. Needle was removed and a micropuncture dilator set was placed. Super stiff Amplatz wire was advanced into the stomach. A 9-French vascular sheath was placed and the orogastric tube was snared using a Goos[place]ck snare device. The orogastric tube and snare were pulled out of the patient's mouth. The snare device was connected to a 20-French gastrostomy tube. The snare device and gastrostomy tube were pulled through the patient's mouth and out the anterior abdominal wall.  The gastrostomy tube was cut to an appropriate length. T fastener was cut. Contrast injection through gastrostomy tube confirmed placement within the stomach. Fluoroscopic images were obtained for documentation. The gastrostomy tube was flushed with normal saline. IMPRESSION: Successful fluoroscopic guided percutaneous gastrostomy tube placement. Electronically Signed   By: Juliene Balder M.D.   On: 11/19/2024 21:04   DG Abd 1 View Result Date: 11/19/2024 CLINICAL DATA:  Evaluate oral contrast prior to gastrostomy tube placement. EXAM: ABDOMEN - 1 VIEW COMPARISON:  CT abdomen 11/13/2024 FINDINGS: Feeding tube extends down the esophagus and terminates in the distal stomach/duodenal bulb region. There is oral contrast in the colon. Right hip replacement. Nonobstructive bowel gas pattern. IMPRESSION: 1. Oral  contrast in the colon. 2. Feeding tube terminates in the distal stomach/duodenal bulb region. Electronically Signed   By: Juliene Balder M.D.   On: 11/19/2024 18:51    Labs:  CBC: Recent Labs    11/17/24 0550 11/18/24 0429 11/19/24 0401 11/20/24 0356  WBC 6.3 6.8 7.1 8.2  HGB 8.8* 8.6* 9.3* 8.8*  HCT 27.0* 25.7* 28.7* 26.8*  PLT 337 316 288 269    COAGS: Recent Labs    10/24/24 0610 10/24/24 1500 10/24/24 2151 10/25/24 0507 10/29/24 0530 11/15/24 0525 11/19/24 0401  INR 1.4* 1.2 1.3* 1.3* 1.1 1.0 0.9  APTT 35 32 34 36  --   --   --     BMP: Recent Labs    11/17/24 0550 11/18/24 0429 11/19/24 0401 11/20/24 0356  NA 136 136 133* 135  K 4.3 4.1 4.3 3.8  CL 101 100 99 100  CO2 24 24 22 23   GLUCOSE 104* 113* 104* 108*  BUN 55* 57* 55* 42*  CALCIUM  9.5 9.5 9.5 9.5  CREATININE 1.49* 1.58* 1.59* 1.56*  GFRNONAA 38* 35* 35* 36*    LIVER FUNCTION TESTS: Recent Labs    11/10/24 0408 11/11/24 0334 11/12/24 0327 11/13/24 0155  BILITOT <0.2 0.2 0.3 0.2  AST 17 18 16 22   ALT 10 12 10 13   ALKPHOS 83 83 88 91  PROT 5.7* 5.9* 6.1* 6.3*  ALBUMIN  3.2* 3.4* 3.5 3.6    Assessment and Plan:  S/p pull through (20Fr) G tube placed 12/30 by Dr. Balder.  No concerns with g tube with assessment today. May continue to use g tube. ASA was already resumed prior to rounding by IP team. Some concern with increased pain but is within the realm of expected at this time. VSS, Hgb stable, scant old blood at insertion site, no blood in tube, tube feedings running without pain being associated with TF.  Discussed with Dr. Johann. No need to perform imaging at this time but do recommend CT if pain becomes more severe, Hgb drops, abd becomes distended, unable to tolerate TF, etc.   Electronically Signed: Laymon Coast, NP 11/20/2024, 2:35 PM   I spent a total of 15 Minutes at the the patient's bedside AND on the patient's hospital floor or unit, greater than 50% of which was  counseling/coordinating care for s/p g tube 12/30.      "

## 2024-11-20 NOTE — TOC Initial Note (Addendum)
 Transition of Care [facility]) - Initial/Assessment Note    Patient Details  Name: Erin [name] MRN: [id number] Date of Birth: 1956/10/01  [facility] CM/SW Contact:    Inocente GORMAN Kindle, LCSW Phone Number: 11/20/2024, 11:43 AM  Clinical Narrative:                 CSW received call from patient's spouse asking to take patient home with home health instead of SNF. He reported that he is comfortable taking care of her at this level and requested a hospital bed and teaching for the tube feeds. He reported patient has a walker, wheelchair, and BSC already. Confirmed his address for delivery. He reported no request for a particular HH agency as long as they have staffing.   While speaking with spouse, patient in the background stated that perhaps she should consider SNF rehab. CSW encouraged them both to consider a short stay since patient will receive more therapies and adjust to tube feeds. Patient reported agreement with SNF and spouse also agrees if that is what patient wants. They reported preference for [facility].  CSW confirmed that [facility] will be able to accept patient on Friday pending insurance approval. CSW updated facility that tube feeds will be changed to Osmolite nocturnal.    CSW initiated insurance process for [facility] Friday, Ref# [id number].     Expected Discharge Plan: Skilled Nursing Facility Barriers to Discharge: Insurance Authorization, Continued Medical Work up   Patient Goals and CMS Choice Patient states their goals for this hospitalization and ongoing recovery are:: Rehab CMS Medicare.gov Compare Post Acute Care list provided to:: Patient Represenative (must comment) (husband) Choice offered to / list presented to : Spouse [place] ownership interest in [facility]provided to:: Spouse    Expected Discharge Plan and Services In-house Referral: Clinical Social Work Discharge Planning Services: CM Consult Post Acute Care Choice: Skilled  Nursing Facility Living arrangements for the past 2 months: Single Family Home Expected Discharge Date: 10/23/24               DME Arranged: N/A DME Agency: NA       HH Arranged: NA HH Agency: NA        Prior Living Arrangements/Services Living arrangements for the past 2 months: Single Family Home Lives with:: Spouse Patient language and need for interpreter reviewed:: Yes Do you feel safe going back to the place where you live?: Yes      Need for Family Participation in Patient Care: Yes (Comment) Care giver support system in place?: Yes (comment) Current home services: DME (walker, wc, BSC) Criminal Activity/Legal Involvement Pertinent to Current Situation/Hospitalization: No - Comment as needed  Activities of Daily Living   ADL Screening (condition at time of admission) Independently performs ADLs?: Yes (appropriate for developmental age) Is the patient deaf or have difficulty hearing?: No Does the patient have difficulty seeing, even when wearing glasses/contacts?: No Does the patient have difficulty concentrating, remembering, or making decisions?: No  Permission Sought/Granted Permission sought to share information with : Family Supports Permission granted to share information with : Yes, Verbal Permission Granted  Share Information with NAME: Nary, Sneed (Spouse)  [phone]  Permission granted to share info w AGENCY: SNFs        Emotional Assessment Appearance:: Appears stated age Attitude/Demeanor/Rapport: Gracious Affect (typically observed): Accepting, Appropriate, Pleasant Orientation: : Oriented to Self Alcohol / Substance Use: Not Applicable Psych Involvement: No (comment)  Admission diagnosis:  Incisional hernia, without obstruction or gangrene [K43.2] S/P  hernia repair [id number], Z87.19] Shock (HCC) [R57.9] Patient Active Problem List   Diagnosis Date Noted   Protein-calorie malnutrition, severe 11/18/2024   Malnutrition of moderate degree  10/25/2024   S/P hernia repair 10/23/2024   Shock (HCC) 10/23/2024   History of colonic polyps 08/29/2024   Renal artery stenosis 07/02/2024   AKI (acute kidney injury) 01/14/2024   Essential hypertension 09/25/2023   Restless leg syndrome 04/20/2023   Preoperative cardiovascular examination 05/10/2022   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/03/2020   Hyperlipidemia 02/12/2020   Cardiomyopathy (HCC) 11/03/2017   Peripheral vascular disease 11/03/2017   Coronary artery disease involving native coronary artery of native heart without angina pectoris 10/13/2017   Aortoiliac occlusive disease (HCC) 08/14/2017   Hypokalemia 06/06/2016   Family history of early CAD 04/08/2016   Elevated glucose 03/18/2016   B12 deficiency anemia 09/14/2015   Hypothyroidism 09/10/2015   GERD without esophagitis 09/10/2015   BPPV (benign paroxysmal positional vertigo) 07/08/2015   Neck pain 07/08/2015   Anal cancer (HCC) 08/19/2013   IBS (irritable bowel syndrome) 03/29/2013   Chronic daily headache 03/29/2013   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   PCP:  [name] HERO, MD Pharmacy:   [id number] [place], [place] - [address] [place] [address] Phone: [phone] Fax: [phone]  [place] [facility] [address], [address] [place] [address] Phone: [phone] Fax: [phone]     Social Drivers of Health (SDOH) Social History: SDOH Screenings   Food Insecurity: No Food Insecurity (10/24/2024)  Housing: Low Risk (10/24/2024)  Transportation Needs: No Transportation Needs (10/24/2024)  Utilities: Not At Risk (10/24/2024)  Alcohol Screen: Low Risk (02/26/2024)  Depression (PHQ2-9): Low Risk (02/26/2024)  Financial Resource Strain: Low Risk (02/26/2024)  Physical Activity: Inactive (02/26/2024)   Social Connections: Socially Integrated (10/24/2024)  Stress: No Stress Concern Present (02/26/2024)  Tobacco Use: Medium Risk (10/23/2024)  Health Literacy: Adequate Health Literacy (02/26/2024)   SDOH Interventions:     Readmission Risk Interventions     No data to display

## 2024-11-21 DIAGNOSIS — E43 Unspecified severe protein-calorie malnutrition: Secondary | ICD-10-CM | POA: Diagnosis not present

## 2024-11-21 DIAGNOSIS — Z9889 Other specified postprocedural states: Secondary | ICD-10-CM | POA: Diagnosis not present

## 2024-11-21 DIAGNOSIS — R579 Shock, unspecified: Secondary | ICD-10-CM | POA: Diagnosis not present

## 2024-11-21 DIAGNOSIS — E440 Moderate protein-calorie malnutrition: Secondary | ICD-10-CM | POA: Diagnosis not present

## 2024-11-21 LAB — GLUCOSE, CAPILLARY
Glucose-Capillary: 103 mg/dL — ABNORMAL HIGH (ref 70–99)
Glucose-Capillary: 104 mg/dL — ABNORMAL HIGH (ref 70–99)
Glucose-Capillary: 109 mg/dL — ABNORMAL HIGH (ref 70–99)
Glucose-Capillary: 123 mg/dL — ABNORMAL HIGH (ref 70–99)
Glucose-Capillary: 125 mg/dL — ABNORMAL HIGH (ref 70–99)
Glucose-Capillary: 96 mg/dL (ref 70–99)
Glucose-Capillary: 96 mg/dL (ref 70–99)

## 2024-11-21 NOTE — Progress Notes (Signed)
 "                        PROGRESS NOTE        PATIENT DETAILS Name: Erin [name] Age: 69 y.o. Sex: female Date of Birth: 14-Aug-1956 Admit Date: 10/23/2024 Admitting Physician Lynda Leos, MD [name], Heron [name], MD  Brief Summary: Patient is a 69 y.o.  female with history of CAD s/p PCI, PAD s/p B/L femoral bypass 2014-mesenteric stent placement 08/2024, DM-2, HTN, HLD, anal cancer-s/p chemo/XRT 2014-who was initially admitted by [facility] for incisional hernia on 12/3-unfortunately-developed postoperative hemorrhagic shock in the setting of hemoperitoneum requiring intubation/pressors and transferred to ICU.  Further hospital course complicated by AKI, B/L CVA in the setting of hypotension.  She was stabilized in the ICU-subsequently transferred to [facility] on 12/15.  See below for further details.  Significant events: 12/3>> incisional hernia repair-developed postoperative hypotension and acute blood loss anemia-Enterline placed-transferred to ICU  12/4>> back to operating room-hemoperitoneum-active bleed not identified-abdomen packed-wound VAC placed-left intubated-transferred back to the ICU with open abdomen. 12/5>> back to the operating room-no evidence of bleeding-abdomen closed 12/8>> neuroimaging suggestive of CVA 12/11>> extubated 12/15>> transferred to [facility] 12/30>> PEG by IR  Significant studies: 11/28>> A1c: 4.9 12/4>> CT angio abdomen/pelvis: Large right intraperitoneal hematoma with active bleeding from the right epigastric artery, left renal artery occluded at the origin with minimal enhancement.  Severe narrowing of the SMA immediately distal to the stent which is patent. 12/8>> MRI brain: Scattered acute infarcts in B/L cerebral hemisphere/cerebellum. 12/8>> MRI brain: No LVO-no significant intra or cranial stenosis. 12/8>> echo: EF 60-65% 12/9>> carotid Doppler: No significant stenosis 12/9>> LDL: 13 12/24>> CT abdomen: Safe percutaneous window for gastrostomy  placement  Significant microbiology data: 12/6>> tracheal aspirate: Enterobacter cloacae 12/9>> tracheal aspirate: Respiratory flora 12/13>> stool C. difficile: Negative 12/13>> GI pathogen panel: No growth  Procedures: See above  Consults: General Surgery PCCM Nephrology Neurology IR  Subjective: No major issues overnight-tolerated nocturnal tube feeds well-some soreness around PEG tube insertion site.  Spouse at bedside.  Objective: Vitals: Blood pressure 135/70, pulse 76, temperature 98.6 F (37 C), temperature source Oral, resp. rate 14, height 5' 3 (1.6 m), weight 44.9 kg, SpO2 97%.   Exam: Gen Exam:Alert awake-not in any distress HEENT:atraumatic, normocephalic Chest: B/L clear to auscultation anteriorly CVS:S1S2 regular Abdomen:soft non tender, non distended Extremities:no edema Neurology: Non focal Skin: no rash  Pertinent Labs/Radiology:    Latest Ref Rng & Units 11/20/2024    3:56 AM 11/19/2024    4:01 AM 11/18/2024    4:29 AM  CBC  WBC 4.0 - 10.5 K/uL 8.2  7.1  6.8   Hemoglobin 12.0 - 15.0 g/dL 8.8  9.3  8.6   Hematocrit 36.0 - 46.0 % 26.8  28.7  25.7   Platelets 150 - 400 K/uL 269  288  316     Lab Results  Component Value Date   NA 135 11/20/2024   K 3.8 11/20/2024   CL 100 11/20/2024   CO2 23 11/20/2024      Assessment/Plan: Postoperative hemorrhagic shock with acute blood loss anemia following incisional hernia repair on 12/3 Resolved-Hb/BP now stable. Did require massive transfusion protocol with 4 units of PRBC/2 units of cryoprecipitate/1 unit of FFP and vasopressors for resuscitation.  S/p elective incisional hernia repaiR 12/3-complicated by intra-abdominal hematoma/postoperative hemorrhagic shock requiring reexploration on 12/4 and 12/5 Postop care managed by general surgery Surgery team following intermittently  AKI on CKD  stage IIIb Secondary to ATN-from hemorrhagic shock Renal function improving with supportive care  Right  renal infarct Seen incidentally on CT imaging on 12/4-likely due to hypoperfusion in setting of hypotension Supportive care at this point.  Acute hypoxic respiratory failure Intubated 12/4-extubated 12/11 Currently on room air  HCAP (Enterobacter cloacae on tracheal aspirate culture) Completed a course of antibiotics  Acute bilateral cerebellar/cerebral infarct secondary to profound intracranial hypoperfusion due to hemorrhagic shock/hypotension. Workup as above Oropharyngeal dysphagia-improved but oral intake is poor-underwent PEG tube placement 12/30 Significant improvement in aphasia-almost able to talk fluently and in full sentences. Continue ASA  Oropharyngeal dysphagia Secondary to CVA/anoxic injury Followed by SLP-tolerating dysphagia 3 diet PEG tube inserted on 12/30 to supplement caloric intake (per prior notes-oral intake erratic/poor)-seems to be tolerating nocturnal tube feeds well. Continue to encourage oral intake during the daytime.  Acute metabolic encephalopathy Secondary to acute/critical illness/CVA/potential anoxic injury from hypotension Thankfully-significantly improved after treatment of underlying etiologies Delirium precautions  Multifactorial anemia Secondary to hemorrhagic shock-critical illness Hb stable Follow CBC periodically  History of left renal artery stenosis Supportive care  History of PAD-s/p prior aortobifem surgery 2014-stenting of SMA October 2025 (Dr. Serene) Supportive care On aspirin   History of peripheral neuropathy On Neurontin -significantly higher doses at home-cut back currently due to encephalopathy  HTN BP stable Amlodipine /Coreg /hydralazine   HLD Statin  Hypothyroidism Synthroid   DVT/deconditioning Secondary to critical illness SNF recommended but now CIR also taking a look.  Nutrition Status: Nutrition Problem: Severe Malnutrition Etiology: chronic illness Signs/Symptoms: severe muscle depletion, severe fat  depletion Percent weight loss: 15 % (in 6 months) Interventions: Magic cup, Tube feeding, Liberalize Diet, Refer to RD note for recommendations  Underweight: Estimated body mass index is 17.53 kg/m as calculated from the following:   Height as of this encounter: 5' 3 (1.6 m).   Weight as of this encounter: 44.9 kg.   Code status:   Code Status: Full Code   DVT Prophylaxis: heparin  injection 5,000 Units Start: 11/21/24 0600 SCD's Start: 10/23/24 1918   Family Communication: Spouse at bedside   Disposition Plan: Status is: Inpatient Remains inpatient appropriate because: Severity of illness   Planned Discharge Destination:Skilled nursing facility versus CIR.   Diet: Diet Order             Diet regular Room service appropriate? Yes with Assist; Fluid consistency: Thin  Diet effective now           Diet - low sodium heart healthy                     Antimicrobial agents: Anti-infectives (From admission, onward)    Start     Dose/Rate Route Frequency Ordered Stop   11/19/24 1200  ceFAZolin  (ANCEF ) IVPB 2g/100 mL premix       Note to Pharmacy: For IR procedure, g tube placement, tentative for tomorrow 12/30   2 g 200 mL/hr over 30 Minutes Intravenous  Once 11/18/24 1511     11/19/24 0826  ceFAZolin  (ANCEF ) IVPB 2g/100 mL premix        over 30 Minutes Intravenous Continuous PRN 11/19/24 0835 11/19/24 0857   11/01/24 1200  piperacillin -tazobactam (ZOSYN ) IVPB 3.375 g        3.375 g 12.5 mL/hr over 240 Minutes Intravenous Every 8 hours 11/01/24 0822 11/04/24 0048   10/30/24 1400  piperacillin -tazobactam (ZOSYN ) IVPB 2.25 g  Status:  Discontinued        2.25 g 100 mL/hr over 30 Minutes Intravenous Every  8 hours 10/30/24 0939 11/01/24 0822   10/28/24 1600  ceFEPIme  (MAXIPIME ) 2 g in sodium chloride  0.9 % 100 mL IVPB  Status:  Discontinued        2 g 200 mL/hr over 30 Minutes Intravenous Every 24 hours 10/28/24 1441 10/30/24 0939   10/26/24 1800  cefTRIAXone   (ROCEPHIN ) 2 g in sodium chloride  0.9 % 100 mL IVPB  Status:  Discontinued        2 g 200 mL/hr over 30 Minutes Intravenous Daily-1800 10/26/24 1739 10/28/24 1441   10/23/24 0700  ceFAZolin  (ANCEF ) IVPB 2g/100 mL premix        2 g 200 mL/hr over 30 Minutes Intravenous On call to O.R. 10/23/24 9351 10/23/24 9166        MEDICATIONS: Scheduled Meds:  amLODipine   10 mg Per Tube Daily   aspirin  EC  81 mg Oral Daily   carvedilol   12.5 mg Per Tube BID WC   feeding supplement (OSMOLITE 1.5 CAL)  1,000 mL Per Tube Q24H   feeding supplement (PROSource TF20)  60 mL Per Tube Daily   free water   200 mL Per Tube Q6H   gabapentin   100 mg Per Tube Q12H   Gerhardt's butt cream   Topical BID   heparin  injection (subcutaneous)  5,000 Units Subcutaneous Q8H   hydrALAZINE   50 mg Per Tube Q8H   Influenza vac split trivalent PF  0.5 mL Intramuscular Tomorrow-1000   levothyroxine   75 mcg Per Tube Q0600   methylphenidate   5 mg Oral q morning   mouth rinse  15 mL Mouth Rinse 4 times per day   pantoprazole  (PROTONIX ) IV  40 mg Intravenous QHS   rosuvastatin   40 mg Oral Daily   Continuous Infusions:   ceFAZolin  (ANCEF ) IV     PRN Meds:.acetaminophen , hydrALAZINE , hydrocortisone  cream, labetalol , loperamide , ondansetron  **OR** ondansetron  (ZOFRAN ) IV, mouth rinse, traMADol    I have personally reviewed following labs and imaging studies  LABORATORY DATA: CBC: Recent Labs  Lab 11/16/24 0539 11/17/24 0550 11/18/24 0429 11/19/24 0401 11/20/24 0356  WBC 6.2 6.3 6.8 7.1 8.2  HGB 9.0* 8.8* 8.6* 9.3* 8.8*  HCT 27.4* 27.0* 25.7* 28.7* 26.8*  MCV 95.5 95.1 95.5 95.3 94.7  PLT 343 337 316 288 269    Basic Metabolic Panel: Recent Labs  Lab 11/16/24 0539 11/17/24 0550 11/18/24 0429 11/19/24 0401 11/20/24 0356  NA 134* 136 136 133* 135  K 4.0 4.3 4.1 4.3 3.8  CL 99 101 100 99 100  CO2 23 24 24 22 23   GLUCOSE 117* 104* 113* 104* 108*  BUN 57* 55* 57* 55* 42*  CREATININE 1.64* 1.49* 1.58*  1.59* 1.56*  CALCIUM  9.4 9.5 9.5 9.5 9.5    GFR: Estimated Creatinine Clearance: 24.5 mL/min (A) (by C-G formula based on SCr of 1.56 mg/dL (H)).  Liver Function Tests: No results for input(s): AST, ALT, ALKPHOS, BILITOT, PROT, ALBUMIN  in the last 168 hours.  No results for input(s): LIPASE, AMYLASE in the last 168 hours. No results for input(s): AMMONIA in the last 168 hours.  Coagulation Profile: Recent Labs  Lab 11/15/24 0525 11/19/24 0401  INR 1.0 0.9    Cardiac Enzymes: No results for input(s): CKTOTAL, CKMB, CKMBINDEX, TROPONINI in the last 168 hours.  BNP (last 3 results) No results for input(s): PROBNP in the last 8760 hours.  Lipid Profile: No results for input(s): CHOL, HDL, LDLCALC, TRIG, CHOLHDL, LDLDIRECT in the last 72 hours.  Thyroid  Function Tests: No results for input(s): TSH,  T4TOTAL, FREET4, T3FREE, THYROIDAB in the last 72 hours.  Anemia Panel: No results for input(s): VITAMINB12, FOLATE, FERRITIN, TIBC, IRON, RETICCTPCT in the last 72 hours.  Urine analysis:    Component Value Date/Time   COLORURINE STRAW (A) 10/29/2024 1730   APPEARANCEUR CLEAR 10/29/2024 1730   LABSPEC 1.009 10/29/2024 1730   PHURINE 8.0 10/29/2024 1730   GLUCOSEU 50 (A) 10/29/2024 1730   HGBUR SMALL (A) 10/29/2024 1730   BILIRUBINUR NEGATIVE 10/29/2024 1730   BILIRUBINUR negative 09/06/2018 1057   KETONESUR NEGATIVE 10/29/2024 1730   PROTEINUR 100 (A) 10/29/2024 1730   UROBILINOGEN 0.2 09/06/2018 1057   UROBILINOGEN 0.2 09/12/2015 1225   NITRITE NEGATIVE 10/29/2024 1730   LEUKOCYTESUR NEGATIVE 10/29/2024 1730    Sepsis Labs: Lactic Acid, Venous    Component Value Date/Time   LATICACIDVEN 1.8 10/24/2024 0610    MICROBIOLOGY: No results found for this or any previous visit (from the past 240 hours).  RADIOLOGY STUDIES/RESULTS: No results found.    LOS: 29 days   Donalda Applebaum, MD  [facility]    To contact the attending provider between 7A-7P or the covering provider during after hours 7P-7A, please log into the web site [email or web address] and access using universal [place] password for that web site. If you do not have the password, please call the hospital operator.  11/21/2024, 10:18 AM    "

## 2024-11-22 DIAGNOSIS — R579 Shock, unspecified: Secondary | ICD-10-CM | POA: Diagnosis not present

## 2024-11-22 DIAGNOSIS — Z9889 Other specified postprocedural states: Secondary | ICD-10-CM | POA: Diagnosis not present

## 2024-11-22 DIAGNOSIS — E119 Type 2 diabetes mellitus without complications: Secondary | ICD-10-CM | POA: Diagnosis not present

## 2024-11-22 DIAGNOSIS — E440 Moderate protein-calorie malnutrition: Secondary | ICD-10-CM | POA: Diagnosis not present

## 2024-11-22 LAB — GLUCOSE, CAPILLARY
Glucose-Capillary: 101 mg/dL — ABNORMAL HIGH (ref 70–99)
Glucose-Capillary: 108 mg/dL — ABNORMAL HIGH (ref 70–99)
Glucose-Capillary: 101 mg/dL — ABNORMAL HIGH (ref 70–99)
Glucose-Capillary: 99 mg/dL (ref 70–99)
Glucose-Capillary: 113 mg/dL — ABNORMAL HIGH (ref 70–99)
Glucose-Capillary: 98 mg/dL (ref 70–99)

## 2024-11-22 MED ORDER — OSMOLITE 1.5 CAL PO LIQD
119.0000 mL | Freq: Two times a day (BID) | ORAL | Status: AC
  Administered 2024-11-22 (×2): 119 mL
  Filled 2024-11-22 (×2): qty 237

## 2024-11-22 MED ORDER — PANTOPRAZOLE SODIUM 40 MG PO TBEC
40.0000 mg | DELAYED_RELEASE_TABLET | Freq: Every day | ORAL | Status: DC
  Administered 2024-11-22 – 2024-11-24 (×3): 40 mg via ORAL
  Filled 2024-11-22 (×3): qty 1

## 2024-11-22 MED ORDER — OSMOLITE 1.5 CAL PO LIQD
237.0000 mL | Freq: Three times a day (TID) | ORAL | Status: DC
  Administered 2024-11-23 – 2024-11-25 (×5): 237 mL
  Filled 2024-11-22 (×10): qty 237

## 2024-11-22 MED ORDER — OSMOLITE 1.5 CAL PO LIQD: 237.0000 mL | Freq: Three times a day (TID) | ORAL | Status: DC

## 2024-11-22 NOTE — Progress Notes (Signed)
 IP rehab admissions - I met with patient and her husband.  Please also see consult Dr. Emeline today.  Husband was given rehab booklets and verbal discussion of rehab.  They would like CIR.  We will open the case with insurance today to request CIR admission.  [phone]

## 2024-11-22 NOTE — TOC Progression Note (Addendum)
 Transition of Care [facility]) - Progression Note    Patient Details  Name: Erin [name] MRN: [id number] Date of Birth: 1956/09/27  [facility] CM/SW Contact  Inocente GORMAN Kindle, LCSW Phone Number: 11/22/2024, 9:23 AM  Clinical Narrative:    Insurance approval received for [place], [place] #[id number], [place] ID# [id number], effective 11/20/2024-11/22/2024.   CIR currently screening patient. Per spouse, he met with CIR and would like to proceed with their program. CSW updated MD and [facility].     Expected Discharge Plan: Skilled Nursing Facility Barriers to Discharge: Barriers Resolved               Expected Discharge Plan and Services In-house Referral: Clinical Social Work Discharge Planning Services: CM Consult Post Acute Care Choice: Skilled Nursing Facility Living arrangements for the past 2 months: Single Family Home Expected Discharge Date: 10/23/24               DME Arranged: N/A DME Agency: NA       HH Arranged: NA HH Agency: NA         Social Drivers of Health (SDOH) Interventions SDOH Screenings   Food Insecurity: No Food Insecurity (10/24/2024)  Housing: Low Risk (10/24/2024)  Transportation Needs: No Transportation Needs (10/24/2024)  Utilities: Not At Risk (10/24/2024)  Alcohol Screen: Low Risk (02/26/2024)  Depression (PHQ2-9): Low Risk (02/26/2024)  Financial Resource Strain: Low Risk (02/26/2024)  Physical Activity: Inactive (02/26/2024)  Social Connections: Socially Integrated (10/24/2024)  Stress: No Stress Concern Present (02/26/2024)  Tobacco Use: Medium Risk (10/23/2024)  Health Literacy: Adequate Health Literacy (02/26/2024)    Readmission Risk Interventions    11/22/2024    9:22 AM  Readmission Risk Prevention Plan  Transportation Screening Complete  Medication Review (RN Care Manager) Complete  PCP or Specialist appointment within 3-5 days of discharge Complete  HRI or Home Care Consult Complete  SW Recovery Care/Counseling Consult Complete   Palliative Care Screening Not Applicable  Skilled Nursing Facility Complete

## 2024-11-22 NOTE — Progress Notes (Signed)
 Physical Therapy Treatment Patient Details Name: KAILEI COWENS MRN: [id number] DOB: 1956-05-03 Today's Date: 11/22/2024   History of Present Illness 69 y.o. F adm to [facility] 12/3 for ventral hernia repair. 12/4 ex lap with abdominal hemorrhage and remained intubated post op. 12/5 abdominal closure. 12/8 MRI with multiple embolic infarcts. 12/9 transfer to [facility]. 12/11 extubated. PMHx: anal CA, hypothyroidism, GERD, CAD, cardiomyopathy, PVD, HLD, HTN, RLS.    PT Comments  Pt received in supine and agreeable to session. Pt demonstrates good progress towards functional mobility goals and improved activity tolerance this session. Pt able to perform bed mobility with CGA and transfers and gait with min A. Pt requires min A for dynamic sitting balance due to impaired trunk control. Pt able to increase gait distance this session with improved fluidity of gait and RW management. Patient will benefit from intensive inpatient follow-up therapy, >3 hours/day to maximize progress towards mobility goals. Pt continues to benefit from PT services to progress toward functional mobility goals.      If plan is discharge home, recommend the following: A little help with walking and/or transfers;A lot of help with bathing/dressing/bathroom;Assistance with cooking/housework;Assistance with feeding;Direct supervision/assist for medications management;Direct supervision/assist for financial management;Assist for transportation;Help with stairs or ramp for entrance;Supervision due to cognitive status   Can travel by private vehicle     No  Equipment Recommendations  Rolling walker (2 wheels);BSC/3in1;Wheelchair (measurements PT);Hospital bed;Wheelchair cushion (measurements PT)    Recommendations for Other Services       Precautions / Restrictions Precautions Precautions: Fall Recall of Precautions/Restrictions: Impaired Precaution/Restrictions Comments: PEG Restrictions Weight Bearing Restrictions Per Provider  Order: No     Mobility  Bed Mobility Overal bed mobility: Needs Assistance Bed Mobility: Supine to Sit     Supine to sit: Contact guard, Used rails     General bed mobility comments: cues for sequencing and technique. increased time and CGA for safety    Transfers Overall transfer level: Needs assistance Equipment used: Rolling walker (2 wheels) Transfers: Sit to/from Stand Sit to Stand: Min assist           General transfer comment: From EOB x2 with min A for rise and cues for hand placement    Ambulation/Gait Ambulation/Gait assistance: Min assist, +2 safety/equipment Gait Distance (Feet): 50 Feet (+75) Assistive device: Rolling walker (2 wheels) Gait Pattern/deviations: Narrow base of support, Trunk flexed, Step-to pattern, Decreased stride length, Ataxic, Step-through pattern       General Gait Details: Pt initially demonstrating slow steps with cues for sequencing progressing to improved fluidity and step-through pattern. Min A for stability and intermittent RW management. Ataxia noted with decreased awareness of foot placement and narrow BOS   Stairs             Wheelchair Mobility     Tilt Bed    Modified Rankin (Stroke Patients Only) Modified Rankin (Stroke Patients Only) Pre-Morbid Rankin Score: No symptoms Modified Rankin: Moderately severe disability     Balance Overall balance assessment: Needs assistance Sitting-balance support: Bilateral upper extremity supported Sitting balance-Leahy Scale: Poor Sitting balance - Comments: CGA for static sitting EOB and up to min A with dynamic reaching task causing LOB x3   Standing balance support: Bilateral upper extremity supported, During functional activity, Reliant on assistive device for balance Standing balance-Leahy Scale: Poor Standing balance comment: reliant on RW and external support  Communication Communication Communication: Impaired Factors  Affecting Communication: Difficulty expressing self  Cognition Arousal: Alert Behavior During Therapy: WFL for tasks assessed/performed   PT - Cognitive impairments: Awareness, Safety/Judgement, Problem solving, Sequencing, Attention                         Following commands: Impaired Following commands impaired: Follows one step commands with increased time    Cueing Cueing Techniques: Verbal cues, Tactile cues, Visual cues  Exercises Other Exercises Other Exercises: BUE dynamic reaching outside of BOS while sitting EOB    General Comments        Pertinent Vitals/Pain Pain Assessment Pain Assessment: Faces Faces Pain Scale: Hurts little more Pain Location: abdomen (PEG site) Pain Descriptors / Indicators: Grimacing, Aching, Sore, Moaning, Discomfort, Guarding Pain Intervention(s): Limited activity within patient's tolerance, Monitored during session, Repositioned     PT Goals (current goals can now be found in the care plan section) Acute Rehab PT Goals Patient Stated Goal: No additional goals stated during session. PT Goal Formulation: With family Time For Goal Achievement: 11/13/24 Progress towards PT goals: Progressing toward goals    Frequency    Min 3X/week       AM-PAC PT 6 Clicks Mobility   Outcome Measure  Help needed turning from your back to your side while in a flat bed without using bedrails?: None Help needed moving from lying on your back to sitting on the side of a flat bed without using bedrails?: A Little Help needed moving to and from a bed to a chair (including a wheelchair)?: A Little Help needed standing up from a chair using your arms (e.g., wheelchair or bedside chair)?: A Little Help needed to walk in hospital room?: A Little Help needed climbing 3-5 steps with a railing? : A Lot 6 Click Score: 18    End of Session Equipment Utilized During Treatment: Gait belt Activity Tolerance: Patient limited by fatigue;Patient  tolerated treatment well Patient left: in chair;with call bell/phone within reach;with family/visitor present Nurse Communication: Mobility status PT Visit Diagnosis: Unsteadiness on feet (R26.81);Muscle weakness (generalized) (M62.81) Hemiplegia - Right/Left: Left Hemiplegia - dominant/non-dominant: Non-dominant Hemiplegia - caused by: Cerebral infarction     Time: 9144-9074 PT Time Calculation (min) (ACUTE ONLY): 30 min  Charges:    $Gait Training: 8-22 mins $Therapeutic Activity: 8-22 mins PT General Charges $$ ACUTE PT VISIT: 1 Visit                    Darryle George, PTA Acute Rehabilitation Services Secure Chat Preferred  [phone]    Darryle George 11/22/2024, 10:15 AM

## 2024-11-22 NOTE — Plan of Care (Signed)

## 2024-11-22 NOTE — Progress Notes (Signed)
 "                        PROGRESS NOTE        PATIENT DETAILS Name: Erin [name] Age: 69 y.o. Sex: female Date of Birth: 08-21-1956 Admit Date: 10/23/2024 Admitting Physician Lynda Leos, MD [name], Heron [name], MD  Brief Summary: Patient is a 69 y.o.  female with history of CAD s/p PCI, PAD s/p B/L femoral bypass 2014-mesenteric stent placement 08/2024, DM-2, HTN, HLD, anal cancer-s/p chemo/XRT 2014-who was initially admitted by [facility] for incisional hernia on 12/3-unfortunately-developed postoperative hemorrhagic shock in the setting of hemoperitoneum requiring intubation/pressors and transferred to ICU.  Further hospital course complicated by AKI, B/L CVA in the setting of hypotension.  She was stabilized in the ICU-subsequently transferred to [facility] on 12/15.  See below for further details.  Significant events: 12/3>> incisional hernia repair-developed postoperative hypotension and acute blood loss anemia-Enterline placed-transferred to ICU  12/4>> back to operating room-hemoperitoneum-active bleed not identified-abdomen packed-wound VAC placed-left intubated-transferred back to the ICU with open abdomen. 12/5>> back to the operating room-no evidence of bleeding-abdomen closed 12/8>> neuroimaging suggestive of CVA 12/11>> extubated 12/15>> transferred to [facility] 12/30>> PEG by IR  Significant studies: 11/28>> A1c: 4.9 12/4>> CT angio abdomen/pelvis: Large right intraperitoneal hematoma with active bleeding from the right epigastric artery, left renal artery occluded at the origin with minimal enhancement.  Severe narrowing of the SMA immediately distal to the stent which is patent. 12/8>> MRI brain: Scattered acute infarcts in B/L cerebral hemisphere/cerebellum. 12/8>> MRI brain: No LVO-no significant intra or cranial stenosis. 12/8>> echo: EF 60-65% 12/9>> carotid Doppler: No significant stenosis 12/9>> LDL: 13 12/24>> CT abdomen: Safe percutaneous window for gastrostomy  placement  Significant microbiology data: 12/6>> tracheal aspirate: Enterobacter cloacae 12/9>> tracheal aspirate: Respiratory flora 12/13>> stool C. difficile: Negative 12/13>> GI pathogen panel: No growth  Procedures: See above  Consults: General Surgery PCCM Nephrology Neurology IR  Subjective: No issues overnight-stable-tolerating tube feeds without any issues.  Objective: Vitals: Blood pressure (!) 157/80, pulse 77, temperature 97.7 F (36.5 C), temperature source Axillary, resp. rate 18, height 5' 3 (1.6 m), weight 43.9 kg, SpO2 100%.   Exam: Gen Exam:Alert awake-not in any distress HEENT:atraumatic, normocephalic Chest: B/L clear to auscultation anteriorly CVS:S1S2 regular Abdomen:soft non tender, non distended Extremities:no edema Neurology: Non focal-but with generalized weakness. Skin: no rash  Pertinent Labs/Radiology:    Latest Ref Rng & Units 11/20/2024    3:56 AM 11/19/2024    4:01 AM 11/18/2024    4:29 AM  CBC  WBC 4.0 - 10.5 K/uL 8.2  7.1  6.8   Hemoglobin 12.0 - 15.0 g/dL 8.8  9.3  8.6   Hematocrit 36.0 - 46.0 % 26.8  28.7  25.7   Platelets 150 - 400 K/uL 269  288  316     Lab Results  Component Value Date   NA 135 11/20/2024   K 3.8 11/20/2024   CL 100 11/20/2024   CO2 23 11/20/2024      Assessment/Plan: Postoperative hemorrhagic shock with acute blood loss anemia following incisional hernia repair on 12/3 Resolved-Hb/BP now stable. Did require massive transfusion protocol with 4 units of PRBC/2 units of cryoprecipitate/1 unit of FFP and vasopressors for resuscitation.  S/p elective incisional hernia repaiR 12/3-complicated by intra-abdominal hematoma/postoperative hemorrhagic shock requiring reexploration on 12/4 and 12/5 Postop care managed by general surgery Surgery team following intermittently  AKI on CKD stage IIIb Secondary to ATN-from hemorrhagic  shock Renal function improving with supportive care  Right renal  infarct Seen incidentally on CT imaging on 12/4-likely due to hypoperfusion in setting of hypotension Supportive care at this point.  Acute hypoxic respiratory failure Intubated 12/4-extubated 12/11 Currently on room air  HCAP (Enterobacter cloacae on tracheal aspirate culture) Completed a course of antibiotics  Acute bilateral cerebellar/cerebral infarct secondary to profound intracranial hypoperfusion due to hemorrhagic shock/hypotension. Workup as above Oropharyngeal dysphagia-improved but oral intake is poor-underwent PEG tube placement 12/30 Significant improvement in aphasia-almost able to talk fluently and in full sentences. Continue ASA  Oropharyngeal dysphagia Secondary to CVA/anoxic injury Followed by SLP-tolerating dysphagia 3 diet PEG tube inserted on 12/30 to supplement caloric intake (per prior notes-oral intake erratic/poor)-seems to be tolerating nocturnal tube feeds well. Continue to encourage oral intake during the daytime.  Acute metabolic encephalopathy Secondary to acute/critical illness/CVA/potential anoxic injury from hypotension Thankfully-significantly improved after treatment of underlying etiologies Delirium precautions  Multifactorial anemia Secondary to hemorrhagic shock-critical illness Hb stable Follow CBC periodically  History of left renal artery stenosis Supportive care  History of PAD-s/p prior aortobifem surgery 2014-stenting of SMA October 2025 (Dr. Serene) Supportive care On aspirin   History of peripheral neuropathy On Neurontin -significantly higher doses at home-cut back currently due to encephalopathy  HTN BP stable Amlodipine /Coreg /hydralazine   HLD Statin  Hypothyroidism Synthroid   DVT/deconditioning Secondary to critical illness SNF recommended but now CIR also taking a look.  Nutrition Status: Nutrition Problem: Severe Malnutrition Etiology: chronic illness Signs/Symptoms: severe muscle depletion, severe fat  depletion Percent weight loss: 15 % (in 6 months) Interventions: Magic cup, Tube feeding, Liberalize Diet, Refer to RD note for recommendations  Underweight: Estimated body mass index is 17.14 kg/m as calculated from the following:   Height as of this encounter: 5' 3 (1.6 m).   Weight as of this encounter: 43.9 kg.   Code status:   Code Status: Full Code   DVT Prophylaxis: heparin  injection 5,000 Units Start: 11/21/24 0600 SCD's Start: 10/23/24 1918   Family Communication: Spouse at bedside   Disposition Plan: Status is: Inpatient Remains inpatient appropriate because: Severity of illness   Planned Discharge Destination:Skilled nursing facility versus CIR.   Diet: Diet Order             Diet regular Room service appropriate? Yes with Assist; Fluid consistency: Thin  Diet effective now           Diet - low sodium heart healthy                     Antimicrobial agents: Anti-infectives (From admission, onward)    Start     Dose/Rate Route Frequency Ordered Stop   11/19/24 1200  ceFAZolin  (ANCEF ) IVPB 2g/100 mL premix  Status:  Discontinued       Note to Pharmacy: For IR procedure, g tube placement, tentative for tomorrow 12/30   2 g 200 mL/hr over 30 Minutes Intravenous  Once 11/18/24 1511 11/22/24 1131   11/19/24 0826  ceFAZolin  (ANCEF ) IVPB 2g/100 mL premix        over 30 Minutes Intravenous Continuous PRN 11/19/24 0835 11/19/24 0857   11/01/24 1200  piperacillin -tazobactam (ZOSYN ) IVPB 3.375 g        3.375 g 12.5 mL/hr over 240 Minutes Intravenous Every 8 hours 11/01/24 0822 11/04/24 0048   10/30/24 1400  piperacillin -tazobactam (ZOSYN ) IVPB 2.25 g  Status:  Discontinued        2.25 g 100 mL/hr over 30 Minutes Intravenous Every 8 hours  10/30/24 0939 11/01/24 0822   10/28/24 1600  ceFEPIme  (MAXIPIME ) 2 g in sodium chloride  0.9 % 100 mL IVPB  Status:  Discontinued        2 g 200 mL/hr over 30 Minutes Intravenous Every 24 hours 10/28/24 1441 10/30/24 0939    10/26/24 1800  cefTRIAXone  (ROCEPHIN ) 2 g in sodium chloride  0.9 % 100 mL IVPB  Status:  Discontinued        2 g 200 mL/hr over 30 Minutes Intravenous Daily-1800 10/26/24 1739 10/28/24 1441   10/23/24 0700  ceFAZolin  (ANCEF ) IVPB 2g/100 mL premix        2 g 200 mL/hr over 30 Minutes Intravenous On call to O.R. 10/23/24 9351 10/23/24 9166        MEDICATIONS: Scheduled Meds:  amLODipine   10 mg Per Tube Daily   aspirin  EC  81 mg Oral Daily   carvedilol   12.5 mg Per Tube BID WC   feeding supplement (OSMOLITE 1.5 CAL)  1,000 mL Per Tube Q24H   feeding supplement (PROSource TF20)  60 mL Per Tube Daily   free water   200 mL Per Tube Q6H   gabapentin   100 mg Per Tube Q12H   Gerhardt's butt cream   Topical BID   heparin  injection (subcutaneous)  5,000 Units Subcutaneous Q8H   hydrALAZINE   50 mg Per Tube Q8H   Influenza vac split trivalent PF  0.5 mL Intramuscular Tomorrow-1000   levothyroxine   75 mcg Per Tube Q0600   methylphenidate   5 mg Oral q morning   mouth rinse  15 mL Mouth Rinse 4 times per day   pantoprazole   40 mg Oral QHS   rosuvastatin   40 mg Oral Daily   Continuous Infusions:   PRN Meds:.acetaminophen , hydrALAZINE , hydrocortisone  cream, labetalol , loperamide , ondansetron  **OR** ondansetron  (ZOFRAN ) IV, mouth rinse, traMADol    I have personally reviewed following labs and imaging studies  LABORATORY DATA: CBC: Recent Labs  Lab 11/16/24 0539 11/17/24 0550 11/18/24 0429 11/19/24 0401 11/20/24 0356  WBC 6.2 6.3 6.8 7.1 8.2  HGB 9.0* 8.8* 8.6* 9.3* 8.8*  HCT 27.4* 27.0* 25.7* 28.7* 26.8*  MCV 95.5 95.1 95.5 95.3 94.7  PLT 343 337 316 288 269    Basic Metabolic Panel: Recent Labs  Lab 11/16/24 0539 11/17/24 0550 11/18/24 0429 11/19/24 0401 11/20/24 0356  NA 134* 136 136 133* 135  K 4.0 4.3 4.1 4.3 3.8  CL 99 101 100 99 100  CO2 23 24 24 22 23   GLUCOSE 117* 104* 113* 104* 108*  BUN 57* 55* 57* 55* 42*  CREATININE 1.64* 1.49* 1.58* 1.59* 1.56*  CALCIUM   9.4 9.5 9.5 9.5 9.5    GFR: Estimated Creatinine Clearance: 23.9 mL/min (A) (by C-G formula based on SCr of 1.56 mg/dL (H)).  Liver Function Tests: No results for input(s): AST, ALT, ALKPHOS, BILITOT, PROT, ALBUMIN  in the last 168 hours.  No results for input(s): LIPASE, AMYLASE in the last 168 hours. No results for input(s): AMMONIA in the last 168 hours.  Coagulation Profile: Recent Labs  Lab 11/19/24 0401  INR 0.9    Cardiac Enzymes: No results for input(s): CKTOTAL, CKMB, CKMBINDEX, TROPONINI in the last 168 hours.  BNP (last 3 results) No results for input(s): PROBNP in the last 8760 hours.  Lipid Profile: No results for input(s): CHOL, HDL, LDLCALC, TRIG, CHOLHDL, LDLDIRECT in the last 72 hours.  Thyroid  Function Tests: No results for input(s): TSH, T4TOTAL, FREET4, T3FREE, THYROIDAB in the last 72 hours.  Anemia Panel: No results  for input(s): VITAMINB12, FOLATE, FERRITIN, TIBC, IRON, RETICCTPCT in the last 72 hours.  Urine analysis:    Component Value Date/Time   COLORURINE STRAW (A) 10/29/2024 1730   APPEARANCEUR CLEAR 10/29/2024 1730   LABSPEC 1.009 10/29/2024 1730   PHURINE 8.0 10/29/2024 1730   GLUCOSEU 50 (A) 10/29/2024 1730   HGBUR SMALL (A) 10/29/2024 1730   BILIRUBINUR NEGATIVE 10/29/2024 1730   BILIRUBINUR negative 09/06/2018 1057   KETONESUR NEGATIVE 10/29/2024 1730   PROTEINUR 100 (A) 10/29/2024 1730   UROBILINOGEN 0.2 09/06/2018 1057   UROBILINOGEN 0.2 09/12/2015 1225   NITRITE NEGATIVE 10/29/2024 1730   LEUKOCYTESUR NEGATIVE 10/29/2024 1730    Sepsis Labs: Lactic Acid, Venous    Component Value Date/Time   LATICACIDVEN 1.8 10/24/2024 0610    MICROBIOLOGY: No results found for this or any previous visit (from the past 240 hours).  RADIOLOGY STUDIES/RESULTS: No results found.    LOS: 30 days   Donalda Applebaum, MD  [facility]    To contact the attending  provider between 7A-7P or the covering provider during after hours 7P-7A, please log into the web site [email or web address] and access using universal [place] password for that web site. If you do not have the password, please call the hospital operator.  11/22/2024, 11:47 AM    "

## 2024-11-22 NOTE — Progress Notes (Signed)
 Brief Nutrition Support Note  Pt s/p PEG placement 12/30. Pt continues to be lethargic but is starting to feel better. Pt tolerated nocturnal feeds via PEG and can now transition to bolus feeds to allow freedom from pump and allow supplemental tube feeds if PO intake at meals is minimal. Recommend administering bolus if pt consumes < 50% of meal. Continue oral supplements and monitor for tolerance of bolus. Pt and husband agreeable to bolus feeds and appreciative of continued support.  Outlined interventions below:  INTERVENTION:    Continue Magic cup TID with meals, each supplement provides 290 kcal and 9 grams of protein    Transition to bolus feeds via PEG: Day 1: 2 half carton boluses (119 ml) Osmolite 1.5 to assess tolerance If well tolerated, Day 2: 3 boluses (237 ml) Osmolite1.5 daily around meal times if pt consumes < 50% of meals Goal regimen provides 1065 kcals per day (71% estimated needs), 45g protein (60% estimated needs), 543 ml free water  (TF only)    Erin Glance, MS, RDN, LDN Clinical Dietitian I Please reach out via secure chat

## 2024-11-22 NOTE — Progress Notes (Addendum)
 Occupational Therapy Treatment Patient Details Name: Erin [name] MRN: [id number] DOB: Dec 23, 1955 Today's Date: 11/22/2024   History of present illness 69 y.o. F adm to [facility] 12/3 for ventral hernia repair. 12/4 ex lap with abdominal hemorrhage and remained intubated post op. 12/5 abdominal closure. 12/8 MRI with multiple embolic infarcts. 12/9 transfer to [facility]. 12/11 extubated. PMHx: anal CA, hypothyroidism, GERD, CAD, cardiomyopathy, PVD, HLD, HTN, RLS.   OT comments  Patient with good progress toward patient focused goals.  Needing CGA to sit EOB with safety cues.  CGA to perform seated grooming task.  Min to Mod A for lower body ADL from a sit to stand level, with safety cues.  Patient able to mobilize to the 3n1 across the room for toileting with RW and Min A.  Deficits impacting functional independence are listed below.  Given her nice improvement with all goal areas over the last two weeks, OT updating discharge recommendation to [facility].  Patient will benefit from intensive inpatient follow-up therapy, >3 hours/day.  Patient demonstrates the ability to tolerate a higher intensity rehab, and reach a Mod I level for eventual return home.        If plan is discharge home, recommend the following:  Assist for transportation;A little help with bathing/dressing/bathroom;A little help with walking and/or transfers;Direct supervision/assist for financial management;Assistance with cooking/housework;Direct supervision/assist for medications management;Help with stairs or ramp for entrance   Equipment Recommendations  BSC/3in1;Tub/shower seat    Recommendations for Other Services Rehab consult    Precautions / Restrictions Precautions Precautions: Fall Recall of Precautions/Restrictions: Impaired Restrictions Weight Bearing Restrictions Per Provider Order: No       Mobility Bed Mobility Overal bed mobility: Needs Assistance Bed Mobility: Supine to Sit, Sit to Supine     Supine to sit:  Contact guard, Used rails Sit to supine: Contact guard assist        Transfers Overall transfer level: Needs assistance Equipment used: Rolling walker (2 wheels) Transfers: Sit to/from Stand Sit to Stand: Min assist                 Balance Overall balance assessment: Needs assistance Sitting-balance support: Bilateral upper extremity supported Sitting balance-Leahy Scale: Fair     Standing balance support: Reliant on assistive device for balance Standing balance-Leahy Scale: Poor                             ADL either performed or assessed with clinical judgement   ADL   Eating/Feeding: Set up;Sitting   Grooming: Brushing hair;Sitting;Contact guard assist;Wash/dry face   Upper Body Bathing: Contact guard assist;Sitting   Lower Body Bathing: Moderate assistance;Sit to/from stand   Upper Body Dressing : Contact guard assist;Sitting   Lower Body Dressing: Moderate assistance;Sit to/from stand   Toilet Transfer: Minimal assistance;Ambulation;Regular Toilet;Rolling walker (2 wheels)                  Extremity/Trunk Assessment Upper Extremity Assessment Upper Extremity Assessment: Generalized weakness   Lower Extremity Assessment Lower Extremity Assessment: Defer to PT evaluation   Cervical / Trunk Assessment Cervical / Trunk Assessment: Normal    Vision Patient Visual Report: No change from baseline     Perception Perception Perception: Not tested   Praxis Praxis Praxis: Not tested   Communication Communication Communication: Impaired Factors Affecting Communication: Difficulty expressing self   Cognition Arousal: Alert Behavior During Therapy: WFL for tasks assessed/performed Cognition: Cognition impaired   Orientation impairments: Person  Attention impairment (select first level of impairment): Sustained attention Executive functioning impairment (select all impairments): Organization, Sequencing, Reasoning, Problem  solving                   Following commands: Impaired Following commands impaired: Follows one step commands with increased time      Cueing   Cueing Techniques: Verbal cues, Tactile cues, Visual cues  Exercises      Shoulder Instructions       General Comments      Pertinent Vitals/ Pain       Pain Assessment Pain Assessment: Faces Faces Pain Scale: Hurts little more Pain Location: abdomen Pain Descriptors / Indicators: Discomfort, Grimacing Pain Intervention(s): Monitored during session                                                          Frequency  Min 2X/week        Progress Toward Goals  OT Goals(current goals can now be found in the care plan section)  Progress towards OT goals: Progressing toward goals  Acute Rehab OT Goals Patient Stated Goal: Return home OT Goal Formulation: With patient Time For Goal Achievement: 12/06/24 Potential to Achieve Goals: Good ADL Goals Pt Will Perform Grooming: with supervision;standing Pt Will Perform Upper Body Bathing: with set-up;sitting Pt Will Perform Lower Body Dressing: with contact guard assist;sit to/from stand Pt Will Transfer to Toilet: with supervision;ambulating;regular height toilet Additional ADL Goal #1: Pt will follow 2 step commands with 75% accuracy.  Plan      Co-evaluation                 AM-PAC OT 6 Clicks Daily Activity     Outcome Measure   Help from another person eating meals?: A Little Help from another person taking care of personal grooming?: A Little Help from another person toileting, which includes using toliet, bedpan, or urinal?: A Lot Help from another person bathing (including washing, rinsing, drying)?: A Lot Help from another person to put on and taking off regular upper body clothing?: A Little Help from another person to put on and taking off regular lower body clothing?: A Lot 6 Click Score: 15    End of Session Equipment  Utilized During Treatment: Rolling walker (2 wheels)  OT Visit Diagnosis: Muscle weakness (generalized) (M62.81);Other symptoms and signs involving cognitive function;Cognitive communication deficit (R41.841) Symptoms and signs involving cognitive functions: Cerebral infarction   Activity Tolerance Patient tolerated treatment well   Patient Left in bed;with call bell/phone within reach;with bed alarm set;with family/visitor present   Nurse Communication Mobility status        Time: 8561-8543 OT Time Calculation (min): 18 min  Charges: OT General Charges $OT Visit: 1 Visit OT Treatments $Self Care/Home Management : 8-22 mins  11/22/2024  RP, OTR/L  Acute Rehabilitation Services  Office:  [phone]   Erin [name] 11/22/2024, 3:03 PM

## 2024-11-22 NOTE — Consult Note (Signed)
 "     Physical Medicine and Rehabilitation Consult Reason for Consult: Evaluate appropriateness for Inpatient Rehab Referring Physician: Dr. Raenelle    HPI: Erin [name] is a 69 y.o. 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), Anxiety, Aortoiliac occlusive disease (HCC) (08/14/2017), Arthritis, B12 deficiency anemia (09/14/2015), Blood transfusion without reported diagnosis, BPPV (benign paroxysmal positional vertigo) (07/08/2015), Cardiomyopathy (HCC) (11/03/2017), Chronic back pain, Chronic daily headache (03/29/2013), Closed right hip fracture (HCC) (09/10/2015), Coronary artery disease involving native coronary artery of native heart without angina pectoris (10/13/2017), Depression, Essential (hemorrhagic) thrombocythemia (HCC) (09/06/2018), Essential hypertension (09/25/2023), Family history of early CAD (04/08/2016), GERD (gastroesophageal reflux disease), History of hiatal hernia, Hot flashes, Hyperlipidemia, Hyperlipidemia associated with type 2 diabetes mellitus (HCC) (05/11/2017), Hypothyroidism (09/10/2015), IBS (irritable bowel syndrome) (03/29/2013), Neck pain (07/08/2015), 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 . They were admitted to [facility] on 10/23/2024 for planned robotic hernia repair by Dr. Rubin.  Postop course was complicated by hypotension requiring pressor support and ICU admission, ultimately determined to be hemorrhagic shock in the setting of hemoperitoneum.  On 12-4, she was brought back to the OR, no active bleed was identified but the abdomen was packed and a wound VAC was placed.  12-5, she was brought back to the OR with no evidence of bleeding and abdomen was closed.  She was transitioned off of  sedation but remained encephalopathic, and stat CT head and follow-up MRI on 12-8 showed bilateral strokes with scattered areas of restricted diffusion throughout the cerebral hemispheres and cerebellum, along with edema in the right occipital and left temporal lobes.  Neurology was consulted, etiology likely profound intracranial hypoperfusion although some initial concern for embolic due to DIC, ultimately ruled out.  She was stabilized in the ICU, extubated 12-11, and subsequently transferred to [facility] on 12-15.  Her hospitalization was otherwise complicated by AKI on CKD stage IIIb, Enterobacter cloacae a HCAP, dysphagia/malnutrition with PEG placement 12-30, metabolic encephalopathy, anemia, hypertension, poorly controlled pain, and neurogenic bowel/bladder.  PM&R was consulted to evaluate appropriateness for IPR admission.   Per chart review, patient lives in a private home with 24/7 assistance from her spouse who is physically capable of assisting her (recently cared for their parents during hospice transition), single level with one-step to enter with grab bars near the tub/shower and toilet.  Prior to admission, she was independent of mobility with no assistive device other community ambulation was limited at times.  She was independent of ADLs and IADLs, but got assistance from her spouse from grocery shopping.  Currently, she is min assist for bed mobility, min assist for sit to stand transfers and min assist +2 for gait up to 15 feet requiring frequent cueing and limited by fatigue.  She is min assist for grooming and mod assist for sitting balance, requiring increased time for initiation and limited by fatigue.  MBS 12-22 showed mild oropharyngeal dysphagia, she was advanced to a regular diet on 12-31 with transition to nocturnal tube feeds to support nutrition; she is eating 25 to 100% of meals.  Review of Systems  Constitutional:  Negative for chills and fever.  Eyes:  Negative for blurred vision  and double vision.  Respiratory:  Positive for shortness of breath. Negative for cough, hemoptysis and sputum production.   Cardiovascular:  Negative for  chest pain and palpitations.  Gastrointestinal:  Positive for abdominal pain and constipation. Negative for heartburn and vomiting.  Genitourinary:  Positive for frequency and urgency.  Musculoskeletal:  Positive for myalgias.  Neurological:  Positive for sensory change, speech change and focal weakness. Negative for dizziness and headaches.  Psychiatric/Behavioral:  Positive for memory loss. Negative for depression. The patient is nervous/anxious. The patient does not have insomnia.    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/14 60.4Gy/60fx and chemo   Anxiety    Aortoiliac occlusive disease (HCC) 08/14/2017   Arthritis    B12 deficiency anemia 09/14/2015   Blood transfusion without reported diagnosis    BPPV (benign paroxysmal positional vertigo) 07/08/2015   Cardiomyopathy (HCC) 11/03/2017   Chronic back pain    Chronic daily headache 03/29/2013   takes bc powder   Closed right hip fracture (HCC) 09/10/2015   Coronary artery disease involving native coronary artery of native heart without angina pectoris 10/13/2017   DES to mid RCA   Depression    Essential (hemorrhagic) thrombocythemia (HCC) 09/06/2018   Essential hypertension 09/25/2023   Family history of early CAD 04/08/2016   GERD (gastroesophageal reflux disease)    History of hiatal hernia    Hot flashes    Hyperlipidemia    Hyperlipidemia associated with type 2 diabetes mellitus (HCC) 05/11/2017   Hypothyroidism 09/10/2015   IBS (irritable bowel syndrome) 03/29/2013   Neck pain 07/08/2015   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   takes neurotin   Peripheral vascular disease 11/03/2017   QT prolongation    Sacroiliitis 09/06/2018   Spasms of the hands or feet  10/08/2017   Syncope 06/07/2016   Tubular adenoma of colon 09/08/2003   Vertigo    Wears glasses    Past Surgical History:  Procedure Laterality Date   ABDOMINAL AORTOGRAM N/A 08/02/2017   Procedure: ABDOMINAL AORTOGRAM;  Surgeon: Darron Deatrice LABOR, MD;  Location: [facility];  Service: Cardiovascular;  Laterality: N/A;   ABDOMINAL AORTOGRAM W/LOWER EXTREMITY Bilateral 09/03/2024   Procedure: ABDOMINAL AORTOGRAM W/LOWER EXTREMITY;  Surgeon: Serene Gaile ORN, MD;  Location: [facility];  Service: Cardiovascular;  Laterality: Bilateral;   AORTA - BILATERAL FEMORAL ARTERY BYPASS GRAFT N/A 08/14/2017   Procedure: AORTA BIFEMORAL BYPASS GRAFT;  Surgeon: Oris Krystal FALCON, MD;  Location: [facility] OR;  Service: Vascular;  Laterality: N/A;   COLONOSCOPY     COLONOSCOPY N/A 08/29/2024   Procedure: COLONOSCOPY;  Surgeon: Wilhelmenia Aloha Raddle., MD;  Location: [facility];  Service: Gastroenterology;  Laterality: N/A;   CORONARY STENT INTERVENTION N/A 10/13/2017   Procedure: CORONARY STENT INTERVENTION;  Surgeon: Mady Bruckner, MD;  Location: [facility];  Service: Cardiovascular;  Laterality: N/A;   dental implant     ECTOPIC PREGNANCY SURGERY     EMBOLECTOMY N/A 08/14/2017   Procedure: EMBOLECTOMY FEMORAL;  Surgeon: Oris Krystal FALCON, MD;  Location[facility] OR;  Service: Vascular;  Laterality: N/A;   FEMORAL-POPLITEAL BYPASS GRAFT Right 08/14/2017   Procedure: Right Femoral to Above Knee Popliteal Bypass Graft using Non-Reversed Greater Saphenous Vein Graft from Right Leg;  Surgeon: Oris Krystal FALCON, MD;  Location: [facility] OR;  Service: Vascular;  Laterality: Right;   FLEXIBLE SIGMOIDOSCOPY N/A 08/14/2013   Procedure: FLEXIBLE SIGMOIDOSCOPY;  Surgeon: Gwendlyn ONEIDA Buddy, MD;  Location: [facility];  Service: Endoscopy;  Laterality: N/A;   IR GASTROSTOMY TUBE MOD  SED  11/19/2024   LAPAROTOMY N/A 10/24/2024   Procedure: EXPLORATORY LAPAROTOMY, ABDOMINAL PACKING AND WOUND VAC APPLICATION;  Surgeon: Debby Hila, MD;  Location: [facility];  Service: General;  Laterality: N/A;  (8) EIGHT 18X18 LAP SPONGE PACKED   LAPAROTOMY N/A 10/25/2024   Procedure: LAPAROTOMY, EXPLORATORY;  Surgeon: Rubin Calamity, MD;  Location: [facility];  Service: General;  Laterality: N/A;  REMOVAL OF PACKING   LEFT HEART CATH AND CORONARY ANGIOGRAPHY N/A 10/13/2017   Procedure: LEFT HEART CATH AND CORONARY ANGIOGRAPHY;  Surgeon: Mady Bruckner, MD;  [facility]: [facility];  Service: Cardiovascular;  Laterality: N/A;   LOWER EXTREMITY ANGIOGRAPHY Bilateral 08/02/2017   Procedure: Lower Extremity Angiography;  Surgeon: Darron Deatrice LABOR, MD;  Location: [facility] INVASIVE CV LAB;  Service: Cardiovascular;  Laterality: Bilateral;   LOWER EXTREMITY INTERVENTION N/A 09/03/2024   Procedure: LOWER EXTREMITY INTERVENTION;  Surgeon: Serene Gaile ORN, MD;  [facility]: [facility];  Service: Cardiovascular;  Laterality: N/A;   MULTIPLE TOOTH EXTRACTIONS     PERIPHERAL VASCULAR INTERVENTION  05/22/2018   Procedure: PERIPHERAL VASCULAR INTERVENTION;  Surgeon: Serene Gaile ORN, MD;  [facility]: [facility];  Service: Cardiovascular;;  SMA and Celiac   PILONIDAL CYST EXCISION     POLYPECTOMY     THROMBECTOMY FEMORAL ARTERY Right 08/14/2017   Procedure: THROMBECTOMY FEMORAL ARTERY;  Surgeon: Oris Krystal FALCON, MD;  Location: [facility] OR;  Service: Vascular;  Laterality: Right;   TONSILLECTOMY     TOTAL HIP ARTHROPLASTY  09/11/2015   Procedure: TOTAL HIP ARTHROPLASTY;  Surgeon: Evalene JONETTA Chancy, MD;  Location: [facility];  Service: Orthopedics;;   ULTRASOUND GUIDANCE FOR VASCULAR ACCESS  10/13/2017   Procedure: Ultrasound Guidance For Vascular Access;  Surgeon: Mady Bruckner, MD;  Location: [facility];  Service: Cardiovascular;;   VENTRAL HERNIA REPAIR N/A 10/23/2024   Procedure: REPAIR, HERNIA, VENTRAL, LAPAROSCOPIC;  Surgeon: Rubin Calamity, MD;  Location: [facility];  Service: General;  Laterality: N/A;   VISCERAL ANGIOGRAPHY N/A 03/03/2020    Procedure: MESTENRIC ANGIOGRAPHY;  Surgeon: Serene Gaile ORN, MD;  [facility]: [facility];  Service: Cardiovascular;  Laterality: N/A;   VISCERAL ANGIOGRAPHY N/A 09/03/2024   Procedure: VISCERAL ANGIOGRAPHY;  Surgeon: Serene Gaile ORN, MD;  [facility]: [facility];  Service: Cardiovascular;  Laterality: N/A;   VISCERAL ARTERY INTERVENTION N/A 09/03/2024   Procedure: VISCERAL ARTERY INTERVENTION;  Surgeon: Serene Gaile ORN, MD;  Location: [facility];  Service: Cardiovascular;  Laterality: N/A;   Family History  Problem Relation Age of Onset   Dementia Mother    Arthritis Mother    Hyperlipidemia Mother    Heart disease Mother    Hypertension Mother    Stroke Mother 30   Irritable bowel syndrome Mother    Thyroid  disease Mother    Heart attack Mother    Heart disease Father    Hyperlipidemia Father    Hypertension Father    Stroke Father 55   Thyroid  disease Father    Prostate cancer Father    Heart attack Father    Lung cancer Brother 50   Heart attack Maternal Uncle    Heart attack Maternal Grandfather    Colon cancer Neg Hx    Rectal cancer Neg Hx    Stomach cancer Neg Hx    Esophageal cancer Neg Hx    Social History:  reports that she quit smoking about 10 years ago. Her smoking use included cigarettes. She smoked an average  of 0.5 packs per day. She has never used smokeless tobacco. She reports that she does not drink alcohol and does not use drugs. Allergies: Allergies[1] Medications Prior to Admission  Medication Sig Dispense Refill   acetaminophen  (TYLENOL ) 500 MG tablet Take 1,000 mg by mouth every 6 (six) hours as needed for headache (pain).     acyclovir  ointment (ZOVIRAX ) 5 % Apply 1 Application topically every 3 (three) hours as needed (fever blisters). 15 g 3   amLODipine  (NORVASC ) 5 MG tablet Take 10 mg by mouth daily.     aspirin  EC 81 MG tablet Take 1 tablet (81 mg total) by mouth daily. Swallow whole.     carvedilol  (COREG ) 12.5 MG tablet  Take 12.5 mg by mouth 2 (two) times daily.     Cholecalciferol (VITAMIN D ) 2000 units tablet Take 2,000 Units by mouth daily.     clopidogrel  (PLAVIX ) 75 MG tablet Take 1 tablet (75 mg total) by mouth daily. 30 tablet 11   DULoxetine  (CYMBALTA ) 20 MG capsule TAKE 2-3 CAPSULES BY MOUTH EVERY DAY 270 capsule 1   fluticasone  (FLONASE ) 50 MCG/ACT nasal spray SPRAY 1 SPRAY INTO EACH NOSTRIL DAILY 96 mL 1   gabapentin  (NEURONTIN ) 600 MG tablet TAKE 1 TABLET BY MOUTH THREE TIMES A DAY 90 tablet 3   ketoconazole  (NIZORAL ) 2 % shampoo Apply to hair daily for 7 days -- leave on for 5 minutes, then wash every other day for 7 days, then wash twice a week for 7 days,  then once a week. 120 mL 5   levothyroxine  (SYNTHROID ) 75 MCG tablet TAKE 1 TABLET BY MOUTH DAILY BEFORE BREAKFAST MONDAY-FRIDAY, THEN 1/2 SATURDAY AND SUNDAY 84 tablet 0   metoCLOPramide  (REGLAN ) 5 MG tablet TAKE 1 TABLET BY MOUTH EVERY 6 HOURS AS NEEDED FOR NAUSEA. 60 tablet 0   nitroGLYCERIN  (NITROSTAT ) 0.4 MG SL tablet Place 1 tablet (0.4 mg total) under the tongue every 5 (five) minutes as needed for chest pain. 25 tablet 3   nystatin -triamcinolone  (MYCOLOG II) cream Apply 1 application topically 2 (two) times daily as needed (dry skin).   2   pantoprazole  (PROTONIX ) 40 MG tablet TAKE 1 TABLET BY MOUTH TWICE A DAY 180 tablet 3   potassium chloride  SA (KLOR-CON  M20) 20 MEQ tablet Take 1 tablet (20 mEq total) by mouth 2 (two) times daily. 180 tablet 3   rOPINIRole  (REQUIP ) 1 MG tablet Take 1 tablet (1 mg total) by mouth at bedtime. May Increase to 2 tablet at bedtime after 7 days if needed. 180 tablet 1   rosuvastatin  (CRESTOR ) 40 MG tablet Take 1 tablet (40 mg total) by mouth daily. 90 tablet 1   tiZANidine (ZANAFLEX) 4 MG tablet Take 2 mg by mouth 3 (three) times daily.     topiramate  (TOPAMAX ) 50 MG tablet Take 50 mg by mouth 2 (two) times daily.     vitamin B-12 1000 MCG tablet Take 1 tablet (1,000 mcg total) by mouth daily. 30 tablet 0    hydrALAZINE  (APRESOLINE ) 25 MG tablet Take 1 tab once daily as needed for elevated BP (>160/95)     meclizine  (ANTIVERT ) 25 MG tablet TAKE 1 TABLET BY MOUTH THREE TIMES A DAY AS NEEDED FOR DIZZINESS 90 tablet 1    Home: Home Living Family/patient expects to be discharged to:: Private residence Living Arrangements: Spouse/significant other Available Help at Discharge: Family, Available 24 hours/day Type of Home: House Home Access: Stairs to enter [employer] of Steps: 1 Home Layout: One level  Bathroom Shower/Tub: Health Visitor: Handicapped height Bathroom Accessibility: Yes Home Equipment: Grab bars - tub/shower, Grab bars - toilet, Hand held shower head, Shower seat - built in, BSC/3in1, [employer] (2 wheels), [employer] - single point, Wheelchair - manual Additional Comments: 1 cat and 1 dog at home  Lives With: Spouse  Functional History: Prior Function Prior Level of Function : Independent/Modified Independent, Driving Mobility Comments: no AD, tolerance for community mobility limited as times per spouse ADLs Comments: retired, ind ADLs, IADLS, meds; spouse completes grocery shopping Functional Status:  Mobility: Bed Mobility Overal bed mobility: Needs Assistance Bed Mobility: Supine to Sit Rolling: Modified independent (Device/Increase time) Sidelying to sit: Max assist, Used rails Supine to sit: Min assist, Used rails Sit to supine: Min assist Sit to sidelying: Mod assist General bed mobility comments: Cues for sequencing and technique. light Min A for trunk elevation and sitting balance Transfers Overall transfer level: Needs assistance Equipment used: Rolling walker (2 wheels) Transfers: Sit to/from Stand Sit to Stand: Min assist Bed to/from chair/wheelchair/BSC transfer type:: Step pivot Squat pivot transfers: Max assist Step pivot transfers: Min assist, +2 physical assistance Transfer via Lift Equipment: Stedy General transfer comment:  STS from EOB and recliner with min A for power up and anterior weight shift. Initial posterior lean, but pt able to correct with cues Ambulation/Gait Ambulation/Gait assistance: Min assist, +2 safety/equipment Gait Distance (Feet): 15 Feet (+10) Assistive device: Rolling walker (2 wheels) Gait Pattern/deviations: Narrow base of support, Trunk flexed, Step-to pattern, Decreased stride length General Gait Details: Pt demonstrates short steps with narrow BOS and low foot clearance. Pt able to improve step length and widen BOS with frequent cues. Improved upright posture with facilitation. 1 seated rest break due to fatigue Gait velocity interpretation: <1.31 ft/sec, indicative of household ambulator    ADL: ADL Overall ADL's : Needs assistance/impaired Grooming: Brushing hair, Minimal assistance, Moderate assistance, Sitting Grooming Details (indicate cue type and reason): min-mod A for sitting balance while pt brushing hair; needed ~1 minute to initiate Functional mobility during ADLs: Total assistance, +2 for physical assistance General ADL Comments: total care  Cognition: Cognition Overall Cognitive Status: Impaired/Different from baseline Arousal/Alertness: Awake/alert Orientation Level: Oriented to place, Oriented to time, Disoriented to situation Attention: Focused Focused Attention: Impaired Focused Attention Impairment: Functional basic Cognition Arousal: Alert Behavior During Therapy: WFL for tasks assessed/performed Overall Cognitive Status: Impaired/Different from baseline  Blood pressure (!) 157/80, pulse 77, temperature 97.7 F (36.5 C), temperature source Axillary, resp. rate 18, height 5' 3 (1.6 m), weight 43.9 kg, SpO2 100%. Physical Exam  Constitutional: No apparent distress. Appropriate appearance for age.  HENT: No JVD. Neck Supple. Trachea midline. Atraumatic, normocephalic. + Edentulous lower teeth - compulsively running tongue over lower inner lip  Eyes:  PERRLA. EOMI. Visual fields grossly intact--? Mild deficit in lower fields  Cardiovascular: RRR, no murmurs/rub/gallops. No Edema. Peripheral pulses 2+  Respiratory: CTAB. No rales, rhonchi, or wheezing. On RA.  Abdomen: + bowel sounds, normoactive. Mild distention , diffuse tenderness especially around PEG/Hernia site.  GU: Not examined. +Foley, draining clear urine.  Skin: C/D/I. No apparent lesions.  - hernia repair site with steri strips, healing well - PEG site covered in gauze, no drainage, c/d/i  MSK:      No apparent deformity. + TTP throughout bilateral Les, especially knees and quads       Neurologic exam:  Cognition: AAO to person, place; time with cues Language: Mild dysarthria. Names 1/3 objects correctly. +speech apraxia, difficulty  wordfinding, and substitutions.   Memory: Recalls 0/3 objects at 5 minutes. +distractable.  Insight: Poor insight into current condition.  Mood: Pleasant affect, appropriate mood.  Sensation: To light touch intact in BL UEs and LEs   Reflexes: 2+ in BL UE; 1+ RLE, 3+ LLE, 1-2 beats clonus LLE with ankle jerk. + Hoffman's LUE. CN: 2-12 grossly intact.   Coordination: BL UE and LE ataxia, slightly worse on the left; c/b motor apraxia.  Spasticity: MAS 0 in all extremities.        Strength:                RUE: 5-/5 SA, 5-/5 EF, 5-/5 EE, 5-/5 WE, 5-/5 FF, 5-/5 FA                LUE:  4/5 SA, 4/5 EF, 4/5 EE, 4/5 WE, 4/5 FF, 4/5 FA                RLE: 4-/5 HF, 4-/5 KE, 5-/5  DF, 5-/5  EHL, 5-/5  PF                 LLE:  4-/5 HF, 4-/5 KE, 5-/5  DF, 5-/5  EHL, 5-/5  PF     Results for orders placed or performed during the hospital encounter of 10/23/24 (from the past 24 hours)  Glucose, capillary     Status: Abnormal   Collection Time: 11/21/24 12:00 PM  Result Value Ref Range   Glucose-Capillary 109 (H) 70 - 99 mg/dL  Glucose, capillary     Status: None   Collection Time: 11/21/24  4:26 PM  Result Value Ref Range   Glucose-Capillary 96 70 - 99  mg/dL  Glucose, capillary     Status: Abnormal   Collection Time: 11/21/24  8:59 PM  Result Value Ref Range   Glucose-Capillary 123 (H) 70 - 99 mg/dL  Glucose, capillary     Status: Abnormal   Collection Time: 11/21/24 11:06 PM  Result Value Ref Range   Glucose-Capillary 103 (H) 70 - 99 mg/dL  Glucose, capillary     Status: Abnormal   Collection Time: 11/22/24  4:34 AM  Result Value Ref Range   Glucose-Capillary 101 (H) 70 - 99 mg/dL  Glucose, capillary     Status: Abnormal   Collection Time: 11/22/24  8:04 AM  Result Value Ref Range   Glucose-Capillary 108 (H) 70 - 99 mg/dL   *Note: Due to a large number of results and/or encounters for the requested time period, some results have not been displayed. A complete set of results can be found in Results Review.   No results found.  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  Due to bladder management, bowel management, safety, skin/wound care, disease management, medication administration, pain management, and patient education, does the patient require 24 hr/day rehab nursing? Yes Does the patient require coordinated care of a physician, rehab nurse, therapy disciplines of PT, OT, and SLP to address physical and functional deficits in the context of the above medical diagnosis(es)? Yes Addressing deficits in the following  areas: balance, endurance, locomotion, strength, transferring,  bowel/bladder control, bathing, dressing, feeding, grooming, toileting, cognition, speech, language, swallowing, and psychosocial support Can the patient actively participate in an intensive therapy program of at least 3 hrs of therapy per day at least 5 days per week? Yes The potential for patient to make measurable gains while on inpatient rehab is good Anticipated functional outcomes upon discharge from inpatient rehab are supervision  with PT, supervision with OT, supervision with SLP. Estimated rehab length of stay to reach the above functional goals is: 10-14 days Anticipated discharge destination: Home Overall Rehab/Functional Prognosis: excellent  POST ACUTE RECOMMENDATIONS: This patient's condition is appropriate for continued rehabilitative care in the following setting: CIR Patient has agreed to participate in recommended program. Yes Note that insurance prior authorization may be required for reimbursement for recommended care.    MEDICAL RECOMMENDATIONS: Has not had a BM in several days per patient - denies passing gas - with multiple recent intra-abdominal surgeries is high risk for post-op ileus. Would recommend starting standing bowel regimen with colace and miralax , and consider suppository if no BM within 24 hours.  Patient with waxing and waning confusion per husband consistent with delirium - recommend delirium precautions, optimization of sleep/wake cycle - she is improving     I have personally performed a face to face diagnostic evaluation of this patient. Additionally, I have examined the patient's medical record including any pertinent labs and radiographic images. If the physician assistant has documented in this note, I have reviewed and edited or otherwise concur with the physician assistant's documentation.  Thanks,  Joesph JAYSON Likes, DO 11/22/2024      [1]  Allergies Allergen Reactions   Latex Itching    Must use paper tape   Amitiza  [Lubiprostone ]  Diarrhea    Uncontrollable diarrhea   Nortriptyline  Other (See Comments)    Stomach distention   Sulfa Antibiotics Nausea And Vomiting and Other (See Comments)    GI distress/pain   Tramadol  Itching and Rash   Atorvastatin  Nausea And Vomiting   Claritin [Loratadine] Other (See Comments)    Hot flashes   "

## 2024-11-22 NOTE — Plan of Care (Signed)
" °  Problem: Education: Goal: Knowledge of General Education information will improve Description: Including pain rating scale, medication(s)/side effects and non-pharmacologic comfort measures 11/22/2024 0527 by Carletha Scarce, RN Outcome: Progressing 11/22/2024 0031 by Carletha Scarce, RN Outcome: Progressing   Problem: Health Behavior/Discharge Planning: Goal: Ability to manage health-related needs will improve Outcome: Progressing   Problem: Clinical Measurements: Goal: Ability to maintain clinical measurements within normal limits will improve 11/22/2024 0527 by Carletha Scarce, RN Outcome: Progressing 11/22/2024 0031 by Carletha Scarce, RN Outcome: Progressing   Problem: Activity: Goal: Risk for activity intolerance will decrease 11/22/2024 0527 by Carletha Scarce, RN Outcome: Progressing 11/22/2024 0031 by Carletha Scarce, RN Outcome: Progressing   Problem: Nutrition: Goal: Adequate nutrition will be maintained Outcome: Progressing   "

## 2024-11-23 DIAGNOSIS — Z9889 Other specified postprocedural states: Secondary | ICD-10-CM | POA: Diagnosis not present

## 2024-11-23 DIAGNOSIS — R579 Shock, unspecified: Secondary | ICD-10-CM | POA: Diagnosis not present

## 2024-11-23 DIAGNOSIS — E440 Moderate protein-calorie malnutrition: Secondary | ICD-10-CM | POA: Diagnosis not present

## 2024-11-23 DIAGNOSIS — E119 Type 2 diabetes mellitus without complications: Secondary | ICD-10-CM | POA: Diagnosis not present

## 2024-11-23 LAB — GLUCOSE, CAPILLARY
Glucose-Capillary: 101 mg/dL — ABNORMAL HIGH (ref 70–99)
Glucose-Capillary: 126 mg/dL — ABNORMAL HIGH (ref 70–99)
Glucose-Capillary: 141 mg/dL — ABNORMAL HIGH (ref 70–99)
Glucose-Capillary: 129 mg/dL — ABNORMAL HIGH (ref 70–99)
Glucose-Capillary: 103 mg/dL — ABNORMAL HIGH (ref 70–99)
Glucose-Capillary: 142 mg/dL — ABNORMAL HIGH (ref 70–99)

## 2024-11-23 NOTE — Plan of Care (Signed)
 " Problem: Education: Goal: Knowledge of General Education information will improve Description: Including pain rating scale, medication(s)/side effects and non-pharmacologic comfort measures Outcome: Progressing   Problem: Health Behavior/Discharge Planning: Goal: Ability to manage health-related needs will improve Outcome: Progressing   Problem: Clinical Measurements: Goal: Ability to maintain clinical measurements within normal limits will improve Outcome: Progressing Goal: Will remain free from infection Outcome: Progressing Goal: Diagnostic test results will improve Outcome: Progressing Goal: Respiratory complications will improve Outcome: Progressing Goal: Cardiovascular complication will be avoided Outcome: Progressing   Problem: Activity: Goal: Risk for activity intolerance will decrease Outcome: Progressing   Problem: Nutrition: Goal: Adequate nutrition will be maintained Outcome: Progressing   Problem: Coping: Goal: Level of anxiety will decrease Outcome: Progressing   Problem: Elimination: Goal: Will not experience complications related to bowel motility Outcome: Progressing Goal: Will not experience complications related to urinary retention Outcome: Progressing   Problem: Pain Managment: Goal: General experience of comfort will improve and/or be controlled Outcome: Progressing   Problem: Safety: Goal: Ability to remain free from injury will improve Outcome: Progressing   Problem: Skin Integrity: Goal: Risk for impaired skin integrity will decrease Outcome: Progressing   Problem: Activity: Goal: Ability to tolerate increased activity will improve Outcome: Progressing   Problem: Respiratory: Goal: Ability to maintain a clear airway and adequate ventilation will improve Outcome: Progressing   Problem: Role Relationship: Goal: Method of communication will improve Outcome: Progressing   Problem: Education: Goal: Knowledge of disease or condition  will improve Outcome: Progressing Goal: Knowledge of secondary prevention will improve (MUST DOCUMENT ALL) Outcome: Progressing Goal: Knowledge of patient specific risk factors will improve (DELETE if not current risk factor) Outcome: Progressing   Problem: Ischemic Stroke/TIA Tissue Perfusion: Goal: Complications of ischemic stroke/TIA will be minimized Outcome: Progressing   Problem: Coping: Goal: Will verbalize positive feelings about self Outcome: Progressing Goal: Will identify appropriate support needs Outcome: Progressing   Problem: Health Behavior/Discharge Planning: Goal: Ability to manage health-related needs will improve Outcome: Progressing Goal: Goals will be collaboratively established with patient/family Outcome: Progressing   Problem: Self-Care: Goal: Ability to participate in self-care as condition permits will improve Outcome: Progressing Goal: Verbalization of feelings and concerns over difficulty with self-care will improve Outcome: Progressing Goal: Ability to communicate needs accurately will improve Outcome: Progressing   Problem: Nutrition: Goal: Risk of aspiration will decrease Outcome: Progressing Goal: Dietary intake will improve Outcome: Progressing   Problem: Education: Goal: Knowledge of disease or condition will improve Outcome: Progressing Goal: Knowledge of secondary prevention will improve (MUST DOCUMENT ALL) Outcome: Progressing Goal: Knowledge of patient specific risk factors will improve (DELETE if not current risk factor) Outcome: Progressing   Problem: Ischemic Stroke/TIA Tissue Perfusion: Goal: Complications of ischemic stroke/TIA will be minimized Outcome: Progressing   Problem: Coping: Goal: Will verbalize positive feelings about self Outcome: Progressing Goal: Will identify appropriate support needs Outcome: Progressing   Problem: Health Behavior/Discharge Planning: Goal: Ability to manage health-related needs will  improve Outcome: Progressing Goal: Goals will be collaboratively established with patient/family Outcome: Progressing   Problem: Self-Care: Goal: Ability to participate in self-care as condition permits will improve Outcome: Progressing Goal: Verbalization of feelings and concerns over difficulty with self-care will improve Outcome: Progressing Goal: Ability to communicate needs accurately will improve Outcome: Progressing   Problem: Nutrition: Goal: Risk of aspiration will decrease Outcome: Progressing Goal: Dietary intake will improve Outcome: Progressing   Problem: Education: Goal: Knowledge of disease or condition will improve Outcome: Progressing Goal: Knowledge of secondary prevention will  improve (MUST DOCUMENT ALL) Outcome: Progressing Goal: Knowledge of patient specific risk factors will improve (DELETE if not current risk factor) Outcome: Progressing   Problem: Ischemic Stroke/TIA Tissue Perfusion: Goal: Complications of ischemic stroke/TIA will be minimized Outcome: Progressing   "

## 2024-11-23 NOTE — PMR Pre-admission (Signed)
 PMR Admission Coordinator Pre-Admission Assessment  Patient: Erin [name] is an 69 y.o., female MRN: [id number] DOB: [birth year] Height: 5' 3 (160 cm) Weight: 45.3 kg  Insurance Information HMO: [id number]    PPO:      PCP:      IPA:      80/20:      OTHER: Group [id number] PRIMARY: [employer] Medicare      Policy#: [id number]      Subscriber: self CM Name: ***      Phone#: ***     Fax#: [phone] Pre-Cert#: [id number]      Employer: Not employed Benefits:  Phone #: [phone]     Name: Verified on line [email or web address] 11/22/24 Eff. Date: 11/21/24     Deduct: $0      Out of Pocket Max: $4200 (met $0)      Life Max: n/a CIR: $395 for days 1-6      SNF: $0 for days 1-20; $218 for days 21-100 Outpatient:      Co-Pay: 420/visit Home Health: 100%      Co-Pay: none DME: 80%     Co-Pay: 20% Providers: in network  SECONDARY:       Policy#:      Phone#:   Artist:       Phone#:   [employer] for patients in Inpatient Rehabilitation Facilities with attached Privacy Act Statement-Health Care Records was provided and verbally reviewed with: Patient  Emergency Contact Information Contact Information     Name Relation Home Work Mobile   Erin [name] Spouse [phone]  [phone]      Other Contacts   None on File     Current Medical History  Patient Admitting Diagnosis: Encephalopathy  History of Present Illness:  A 69 y.o. 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), Anxiety, Aortoiliac occlusive disease (HCC) (08/14/2017), Arthritis, B12 deficiency anemia (09/14/2015), Blood transfusion without reported diagnosis, BPPV (benign paroxysmal positional vertigo) (07/08/2015), Cardiomyopathy (HCC) (11/03/2017), Chronic back pain, Chronic daily headache (03/29/2013), Closed right hip fracture (HCC) (09/10/2015), Coronary artery disease involving native coronary artery of native heart without angina  pectoris (10/13/2017), Depression, Essential (hemorrhagic) thrombocythemia (HCC) (09/06/2018), Essential hypertension (09/25/2023), Family history of early CAD (04/08/2016), GERD (gastroesophageal reflux disease), History of hiatal hernia, Hot flashes, Hyperlipidemia, Hyperlipidemia associated with type 2 diabetes mellitus (HCC) (05/11/2017), Hypothyroidism (09/10/2015), IBS (irritable bowel syndrome) (03/29/2013), Neck pain (07/08/2015), 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 . She was initially admitted to [facility] on 10/23/2024 for planned robotic hernia repair by Dr. Rubin.  Postop course was complicated by hypotension requiring pressor support and ICU admission, ultimately determined to be hemorrhagic shock in the setting of hemoperitoneum.  On 12-4, she was brought back to the OR, no active bleed was identified but the abdomen was packed and a wound VAC was placed.  12-5, she was brought back to the OR with no evidence of bleeding and abdomen was closed.  She was transitioned off of sedation but remained encephalopathic, and stat CT head and follow-up MRI on 12-8 showed bilateral strokes with scattered areas of restricted diffusion throughout the cerebral hemispheres and cerebellum, along with edema in the right occipital and left temporal lobes.  Neurology was consulted, etiology likely profound intracranial hypoperfusion although some initial concern for embolic due to DIC, ultimately ruled  out.  She was transferred to [facility] ICU on 10/29/24.  She was stabilized in the ICU, extubated 12-11, and subsequently transferred to [facility] on 12-15.  Her hospitalization was otherwise complicated by AKI on CKD stage IIIb, Enterobacter cloacae a HCAP, dysphagia/malnutrition with PEG placement  12-30, metabolic encephalopathy, anemia, hypertension, poorly controlled pain, and neurogenic bowel/bladder.  PM&R was consulted to evaluate appropriateness for IPR admission.    Per chart review, patient lives in a private home with 24/7 assistance from her spouse who is physically capable of assisting her (recently cared for their parents during hospice transition), single level with one-step to enter with grab bars near the tub/shower and toilet.  Prior to admission, she was independent of mobility with no assistive device other community ambulation was limited at times.  She was independent of ADLs and IADLs, but got assistance from her spouse from grocery shopping.  Currently, she is min assist for bed mobility, min assist for sit to stand transfers and min assist +2 for gait up to 15 feet requiring frequent cueing and limited by fatigue.  She is min assist for grooming and mod assist for sitting balance, requiring increased time for initiation and limited by fatigue.  MBS 12-22 showed mild oropharyngeal dysphagia, she was advanced to a regular diet on 12-31 with transition to nocturnal tube feeds to support nutrition; she is eating 25 to 100% of meals.   Complete NIHSS TOTAL: 8  Patient's medical record from [facility] has been reviewed by the rehabilitation admission coordinator and physician.  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/14 60.4Gy/3fx and chemo   Anxiety    Aortoiliac occlusive disease (HCC) 08/14/2017   Arthritis    B12 deficiency anemia 09/14/2015   Blood transfusion without reported diagnosis    BPPV (benign paroxysmal positional vertigo) 07/08/2015   Cardiomyopathy (HCC) 11/03/2017   Chronic back pain    Chronic daily headache 03/29/2013   takes bc powder   Closed right hip fracture (HCC) 09/10/2015   Coronary artery disease  involving native coronary artery of native heart without angina pectoris 10/13/2017   DES to mid RCA   Depression    Essential (hemorrhagic) thrombocythemia (HCC) 09/06/2018   Essential hypertension 09/25/2023   Family history of early CAD 04/08/2016   GERD (gastroesophageal reflux disease)    History of hiatal hernia    Hot flashes    Hyperlipidemia    Hyperlipidemia associated with type 2 diabetes mellitus (HCC) 05/11/2017   Hypothyroidism 09/10/2015   IBS (irritable bowel syndrome) 03/29/2013   Neck pain 07/08/2015   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 Yes  Family History   family history includes Arthritis in her mother; Dementia in her mother; Heart attack in her father, maternal grandfather, maternal uncle, and mother; Heart disease in her father and mother; Hyperlipidemia in her father and mother; Hypertension in her father and mother; Irritable bowel syndrome in her mother; Lung cancer (age of onset: 58) in her brother; Prostate cancer in her  father; Stroke (age of onset: 6) in her mother; Stroke (age of onset: 30) in her father; Thyroid  disease in her father and mother.  Current Medications Current Medications[1]  Patients Current Diet:  Diet Order             Diet regular Room service appropriate? Yes with Assist; Fluid consistency: Thin  Diet effective now           Diet - low sodium heart healthy                   Precautions / Restrictions Precautions Precautions: Fall Precaution/Restrictions Comments: PEG Restrictions Weight Bearing Restrictions Per Provider Order: No   Has the patient had 2 or more falls or a fall with injury in the past year? No  Prior  Activity Level Limited Community (1-2x/wk): Went out about 2 X a week, was driving, not working.  Prior Functional Level Self Care: Did the patient need help bathing, dressing, using the toilet or eating? Independent  Indoor Mobility: Did the patient need assistance with walking from room to room (with or without device)? Independent  Stairs: Did the patient need assistance with internal or external stairs (with or without device)? Independent  Functional Cognition: Did the patient need help planning regular tasks such as shopping or remembering to take medications? Independent  Patient Information Are you of Hispanic, Latino/a,or Spanish origin?: A. No, not of Hispanic, Latino/a, or Spanish origin What is your race?: A. White Do you need or want an interpreter to communicate with a doctor or health care staff?: 0. No  Patient's Response To:  Health Literacy and Transportation Is the patient able to respond to health literacy and transportation needs?: Yes Health Literacy - How often do you need to have someone help you when you read instructions, pamphlets, or other written material from your doctor or pharmacy?: Rarely In the past 12 months, has lack of transportation kept you from medical appointments or from getting medications?: No In the past 12 months, has lack of transportation kept you from meetings, work, or from getting things needed for daily living?: No  Home Assistive Devices / Equipment Home Equipment: Grab bars - tub/shower, Grab bars - toilet, Hand held shower head, Shower seat - built in, BSC/3in1, [employer] (2 wheels), [employer] - single point, Wheelchair - manual  Prior Device Use: Indicate devices/aids used by the patient prior to current illness, exacerbation or injury? None of the above  Current Functional Level Cognition  Arousal/Alertness: Awake/alert Overall Cognitive Status: Impaired/Different from baseline Orientation Level: Oriented to person, Oriented to  place, Oriented to time Attention: Focused Focused Attention: Impaired Focused Attention Impairment: Functional basic    Extremity Assessment (includes Sensation/Coordination)  Upper Extremity Assessment: Generalized weakness RUE Deficits / Details: full PROM, attempting to actively using UE to reach and grab cortrack. not followign many commands but moving UE.  Repsonds to noxious stimuli. RUE Coordination: decreased fine motor, decreased gross motor LUE Deficits / Details: full PROM. not following many commands but moving UE. Repsonds to noxious stimuli. LUE Coordination: decreased gross motor, decreased fine motor  Lower Extremity Assessment: Defer to PT evaluation RLE Deficits / Details: no AROM noted, PROM WFL, no response to painful stimuli LLE Deficits / Details: no AROM noted, PROM WFL, no response to painful stimuli    ADLs  Overall ADL's : Needs assistance/impaired Eating/Feeding: Set up, Sitting Grooming: Brushing hair, Sitting, Contact guard assist, Wash/dry face Grooming Details (indicate cue type and reason): min-mod A for  sitting balance while pt brushing hair; needed ~1 minute to initiate Upper Body Bathing: Contact guard assist, Sitting Lower Body Bathing: Moderate assistance, Sit to/from stand Upper Body Dressing : Contact guard assist, Sitting Lower Body Dressing: Moderate assistance, Sit to/from stand Toilet Transfer: Minimal assistance, Ambulation, Regular Toilet, Rolling walker (2 wheels) Functional mobility during ADLs: Total assistance, +2 for physical assistance General ADL Comments: total care    Mobility  Overal bed mobility: Needs Assistance Bed Mobility: Supine to Sit, Sit to Supine Rolling: Modified independent (Device/Increase time) Sidelying to sit: Max assist, Used rails Supine to sit: Contact guard, Used rails Sit to supine: Contact guard assist Sit to sidelying: Mod assist General bed mobility comments: cues for sequencing and technique.  increased time and CGA for safety    Transfers  Overall transfer level: Needs assistance Equipment used: Rolling walker (2 wheels) Transfers: Sit to/from Stand Sit to Stand: Min assist Bed to/from chair/wheelchair/BSC transfer type:: Step pivot Squat pivot transfers: Max assist Step pivot transfers: Min assist, +2 physical assistance Transfer via Lift Equipment: Stedy General transfer comment: From EOB x2 with min A for rise and cues for hand placement    Ambulation / Gait / Stairs / Wheelchair Mobility  Ambulation/Gait Ambulation/Gait assistance: Min assist, +2 safety/equipment Gait Distance (Feet): 50 Feet (+75) Assistive device: Rolling walker (2 wheels) Gait Pattern/deviations: Narrow base of support, Trunk flexed, Step-to pattern, Decreased stride length, Ataxic, Step-through pattern General Gait Details: Pt initially demonstrating slow steps with cues for sequencing progressing to improved fluidity and step-through pattern. Min A for stability and intermittent RW management. Ataxia noted with decreased awareness of foot placement and narrow BOS Gait velocity interpretation: <1.31 ft/sec, indicative of household ambulator    Posture / Balance Dynamic Sitting Balance Sitting balance - Comments: CGA for static sitting EOB and up to min A with dynamic reaching task causing LOB x3 Balance Overall balance assessment: Needs assistance Sitting-balance support: Bilateral upper extremity supported Sitting balance-Leahy Scale: Fair Sitting balance - Comments: CGA for static sitting EOB and up to min A with dynamic reaching task causing LOB x3 Postural control:  (Significant anterior lean) Standing balance support: Reliant on assistive device for balance Standing balance-Leahy Scale: Poor Standing balance comment: reliant on RW and external support    Special considerations/life events  Skin Gastric tube in place for feedings, post op abdominal incision with dressing, buttocks dermatitis  and Special service needs ***   Previous Home Environment (from acute therapy documentation) Living Arrangements: Spouse/significant other  Lives With: Spouse Available Help at Discharge: Family, Available 24 hours/day Type of Home: House Home Layout: One level Home Access: Stairs to enter [employer] of Steps: 1 Bathroom Shower/Tub: Health Visitor: Handicapped height Bathroom Accessibility: Yes Home Care Services: No Additional Comments: 1 cat and 1 dog at home  Discharge Living Setting Plans for Discharge Living Setting: Patient's home, House, Lives with (comment) (Lives with 71 yo husband) Type of Home at Discharge: House Discharge Home Layout: One level Discharge Home Access: Stairs to enter Entrance Stairs-Rails: None Entrance Stairs-Number of Steps: 1 Discharge Bathroom Shower/Tub: Walk-in shower, Door Discharge Bathroom Toilet: Handicapped height Discharge Bathroom Accessibility: Yes How Accessible: Accessible via wheelchair, Accessible via walker Does the patient have any problems obtaining your medications?: No  Social/Family/Support Systems Patient Roles: Spouse Contact Information: Deedee Lybarger - husband Anticipated Caregiver: Kadience Macchi husband - [phone] Ability/Limitations of Caregiver: Husband can assist Caregiver Availability: 24/7 Discharge Plan Discussed with Primary Caregiver: Yes Is Caregiver In Agreement with  Plan?: Yes Does Caregiver/Family have Issues with Lodging/Transportation while Pt is in Rehab?: No  Goals Patient/Family Goal for Rehab: PT/OT/SLP supervision goals Expected length of stay: 10-14 days Pt/Family Agrees to Admission and willing to participate: Yes Program Orientation Provided & Reviewed with Pt/Caregiver Including Roles  & Responsibilities: Yes  Decrease burden of Care through IP rehab admission: N/A  Possible need for SNF placement upon discharge: Not planned  Patient Condition: I have  reviewed medical records from [facility], spoken with CSW, and patient and spouse. I met with patient at the bedside for inpatient rehabilitation assessment.  Patient will benefit from ongoing PT, OT, and SLP, can actively participate in 3 hours of therapy a day 5 days of the week, and can make measurable gains during the admission.  Patient will also benefit from the coordinated team approach during an Inpatient Acute Rehabilitation admission.  The patient will receive intensive therapy as well as Rehabilitation physician, nursing, social worker, and care management interventions.  Due to bladder management, bowel management, safety, skin/wound care, disease management, medication administration, pain management, and patient education the patient requires 24 hour a day rehabilitation nursing.  The patient is currently *** with mobility and basic ADLs.  Discharge setting and therapy post discharge at home with home health is anticipated.  Patient has agreed to participate in the Acute Inpatient Rehabilitation Program and will admit {Time; today/tomorrow:10263}.  Preadmission Screen Completed By:  Lovett [name] Ropes, 11/23/2024 11:41 AM ______________________________________________________________________   Discussed status with Dr. PIERRETTE on *** at *** and received approval for admission today.  Admission Coordinator:  Lovett [name] Ropes, RN, time PIERRETTEPattricia ***   Assessment/Plan: Diagnosis: *** Does the need for close, 24 hr/day Medical supervision in concert with the patient's rehab needs make it unreasonable for this patient to be served in a less intensive setting? {yes_no_potentially:3041433} Co-Morbidities requiring supervision/potential complications: *** Due to {due un:6958565}, does the patient require 24 hr/day rehab nursing? {yes_no_potentially:3041433} Does the patient require coordinated care of a physician, rehab nurse, PT, OT, and SLP to address physical and functional deficits in the  context of the above medical diagnosis(es)? {yes_no_potentially:3041433} Addressing deficits in the following areas: {deficits:3041436} Can the patient actively participate in an intensive therapy program of at least 3 hrs of therapy 5 days a week? {yes_no_potentially:3041433} The potential for patient to make measurable gains while on inpatient rehab is {potential:3041437} Anticipated functional outcomes upon discharge from inpatient rehab: {functional outcomes:304600100} PT, {functional outcomes:304600100} OT, {functional outcomes:304600100} SLP Estimated rehab length of stay to reach the above functional goals is: *** Anticipated discharge destination: {anticipated dc setting:21604} 10. Overall Rehab/Functional Prognosis: {potential:3041437}   MD Signature: ***     [1]  Current Facility-Administered Medications:    acetaminophen  (TYLENOL ) tablet 1,000 mg, 1,000 mg, Per Tube, Q6H PRN, Rubin Calamity, MD, 1,000 mg at 11/21/24 9087   amLODipine  (NORVASC ) tablet 10 mg, 10 mg, Per Tube, Daily, de Clint Kill, Cortney E, NP, 10 mg at 11/23/24 1020   aspirin  EC tablet 81 mg, 81 mg, Oral, Daily, Ghimire, Donalda [name], MD, 81 mg at 11/23/24 1019   carvedilol  (COREG ) tablet 12.5 mg, 12.5 mg, Per Tube, BID WC, Olalere, Adewale A, MD, 12.5 mg at 11/23/24 1019   feeding supplement (OSMOLITE 1.5 CAL) liquid 237 mL, 237 mL, Per Tube, TID, Ghimire, Shanker M, MD, 237 mL at 11/23/24 1031   feeding supplement (PROSource TF20) liquid 60 mL, 60 mL, Per Tube, Daily, Ghimire, Donalda [name], MD, 60 mL at 11/23/24 1031  free water  200 mL, 200 mL, Per Tube, Q6H, Dennise, Prashant K, MD, 200 mL at 11/23/24 0530   gabapentin  (NEURONTIN ) 250 MG/5ML solution 100 mg, 100 mg, Per Tube, Q12H, Singh, Prashant K, MD, 100 mg at 11/23/24 1020   Gerhardt's butt cream, , Topical, BID, Dagenhart, Jamie H, NP, Given at 11/22/24 2057   heparin  injection 5,000 Units, 5,000 Units, Subcutaneous, Q8H, Ghimire, Donalda HERO, MD, 5,000 Units at  11/23/24 9478   hydrALAZINE  (APRESOLINE ) injection 10 mg, 10 mg, Intravenous, Q4H PRN, Singh, Prashant K, MD, 10 mg at 11/08/24 9587   hydrALAZINE  (APRESOLINE ) tablet 50 mg, 50 mg, Per Tube, Q8H, Singh, Prashant K, MD, 50 mg at 11/23/24 0520   hydrocortisone  cream 1 %, , Topical, TID PRN, Elgergawy, Brayton RAMAN, MD   Influenza vac split trivalent PF (FLUZONE  HIGH-DOSE) injection 0.5 mL, 0.5 mL, Intramuscular, Tomorrow-1000, Rubin Calamity, MD   labetalol  (NORMODYNE ) injection 20 mg, 20 mg, Intravenous, Q2H PRN, Paliwal, Aditya, MD, 20 mg at 11/06/24 0536   levothyroxine  (SYNTHROID ) tablet 75 mcg, 75 mcg, Per Tube, Q0600, Rubin Calamity, MD, 75 mcg at 11/23/24 9478   loperamide  (IMODIUM ) capsule 2 mg, 2 mg, Oral, PRN, Elgergawy, Dawood S, MD, 2 mg at 11/20/24 2043   methylphenidate  (RITALIN ) tablet 5 mg, 5 mg, Oral, q morning, Lue Elsie BROCKS, MD, 5 mg at 11/23/24 9391   ondansetron  (ZOFRAN -ODT) disintegrating tablet 4 mg, 4 mg, Oral, Q6H PRN **OR** ondansetron  (ZOFRAN ) injection 4 mg, 4 mg, Intravenous, Q6H PRN, Rubin Calamity, MD, 4 mg at 10/23/24 2023   Oral care mouth rinse, 15 mL, Mouth Rinse, 4 times per day, Claudene Toribio BROCKS, MD, 15 mL at 11/22/24 2057   Oral care mouth rinse, 15 mL, Mouth Rinse, PRN, Claudene Toribio BROCKS, MD   pantoprazole  (PROTONIX ) EC tablet 40 mg, 40 mg, Oral, QHS, Powell, Lisa K, RPH, 40 mg at 11/22/24 2055   rosuvastatin  (CRESTOR ) tablet 40 mg, 40 mg, Oral, Daily, Ghimire, Shanker M, MD, 40 mg at 11/23/24 1020   traMADol  (ULTRAM ) tablet 50 mg, 50 mg, Oral, Q12H PRN, Singh, Prashant K, MD, 50 mg at 11/22/24 [phone]

## 2024-11-23 NOTE — Progress Notes (Signed)
 "                        PROGRESS NOTE        PATIENT DETAILS Name: Erin [name] Age: 69 y.o. Sex: female Date of Birth: 16-Feb-1956 Admit Date: 10/23/2024 Admitting Physician Lynda Leos, MD [name], Heron [name], MD  Brief Summary: Patient is a 69 y.o.  female with history of CAD s/p PCI, PAD s/p B/L femoral bypass 2014-mesenteric stent placement 08/2024, DM-2, HTN, HLD, anal cancer-s/p chemo/XRT 2014-who was initially admitted by [facility] for incisional hernia on 12/3-unfortunately-developed postoperative hemorrhagic shock in the setting of hemoperitoneum requiring intubation/pressors and transferred to ICU.  Further hospital course complicated by AKI, B/L CVA in the setting of hypotension.  She was stabilized in the ICU-subsequently transferred to [facility] on 12/15.  See below for further details.  Significant events: 12/3>> incisional hernia repair-developed postoperative hypotension and acute blood loss anemia-Enterline placed-transferred to ICU  12/4>> back to operating room-hemoperitoneum-active bleed not identified-abdomen packed-wound VAC placed-left intubated-transferred back to the ICU with open abdomen. 12/5>> back to the operating room-no evidence of bleeding-abdomen closed 12/8>> neuroimaging suggestive of CVA 12/11>> extubated 12/15>> transferred to [facility] 12/30>> PEG by IR  Significant studies: 11/28>> A1c: 4.9 12/4>> CT angio abdomen/pelvis: Large right intraperitoneal hematoma with active bleeding from the right epigastric artery, left renal artery occluded at the origin with minimal enhancement.  Severe narrowing of the SMA immediately distal to the stent which is patent. 12/8>> MRI brain: Scattered acute infarcts in B/L cerebral hemisphere/cerebellum. 12/8>> MRI brain: No LVO-no significant intra or cranial stenosis. 12/8>> echo: EF 60-65% 12/9>> carotid Doppler: No significant stenosis 12/9>> LDL: 13 12/24>> CT abdomen: Safe percutaneous window for gastrostomy  placement  Significant microbiology data: 12/6>> tracheal aspirate: Enterobacter cloacae 12/9>> tracheal aspirate: Respiratory flora 12/13>> stool C. difficile: Negative 12/13>> GI pathogen panel: No growth  Procedures: See above  Consults: General Surgery PCCM Nephrology Neurology IR  Subjective: No major issues overnight-awaiting either CIR or SNF placement-insurance authorization pending.  Spouse at bedside-speech is clear-answering questions appropriately.  Objective: Vitals: Blood pressure 129/74, pulse 75, temperature 98.2 F (36.8 C), temperature source Oral, resp. rate 11, height 5' 3 (1.6 m), weight 45.3 kg, SpO2 98%.   Exam: Awake/alert Nonfocal exam Speech clear Extremities: No edema  Pertinent Labs/Radiology:    Latest Ref Rng & Units 11/20/2024    3:56 AM 11/19/2024    4:01 AM 11/18/2024    4:29 AM  CBC  WBC 4.0 - 10.5 K/uL 8.2  7.1  6.8   Hemoglobin 12.0 - 15.0 g/dL 8.8  9.3  8.6   Hematocrit 36.0 - 46.0 % 26.8  28.7  25.7   Platelets 150 - 400 K/uL 269  288  316     Lab Results  Component Value Date   NA 135 11/20/2024   K 3.8 11/20/2024   CL 100 11/20/2024   CO2 23 11/20/2024      Assessment/Plan: Postoperative hemorrhagic shock with acute blood loss anemia following incisional hernia repair on 12/3 Resolved-Hb/BP now stable. Did require massive transfusion protocol with 4 units of PRBC/2 units of cryoprecipitate/1 unit of FFP and vasopressors for resuscitation.  S/p elective incisional hernia repaiR 12/3-complicated by intra-abdominal hematoma/postoperative hemorrhagic shock requiring reexploration on 12/4 and 12/5 Postop care managed by general surgery Surgery team following intermittently  AKI on CKD stage IIIb Secondary to ATN-from hemorrhagic shock Renal function improving with supportive care  Right renal infarct Seen incidentally on  CT imaging on 12/4-likely due to hypoperfusion in setting of hypotension Supportive care at  this point.  Acute hypoxic respiratory failure Intubated 12/4-extubated 12/11 Currently on room air  HCAP (Enterobacter cloacae on tracheal aspirate culture) Completed a course of antibiotics  Acute bilateral cerebellar/cerebral infarct secondary to profound intracranial hypoperfusion due to hemorrhagic shock/hypotension. Workup as above Oropharyngeal dysphagia-improved but oral intake is poor-underwent PEG tube placement 12/30 Significant improvement in aphasia-almost able to talk fluently and in full sentences. Continue ASA  Oropharyngeal dysphagia Secondary to CVA/anoxic injury Followed by SLP-tolerating dysphagia 3 diet PEG tube inserted on 12/30 to supplement caloric intake (per prior notes-oral intake erratic/poor)-seems to be tolerating nocturnal tube feeds well. Continue to encourage oral intake during the daytime.  Acute metabolic encephalopathy Secondary to acute/critical illness/CVA/potential anoxic injury from hypotension Thankfully-significantly improved after treatment of underlying etiologies Delirium precautions  Multifactorial anemia Secondary to hemorrhagic shock-critical illness Hb stable Follow CBC periodically  History of left renal artery stenosis Supportive care  History of PAD-s/p prior aortobifem surgery 2014-stenting of SMA October 2025 (Dr. Serene) Supportive care On aspirin   History of peripheral neuropathy On Neurontin -significantly higher doses at home-cut back currently due to encephalopathy  HTN BP stable Amlodipine /Coreg /hydralazine   HLD Statin  Hypothyroidism Synthroid   DVT/deconditioning Secondary to critical illness SNF recommended but now CIR also taking a look.  Nutrition Status: Nutrition Problem: Severe Malnutrition Etiology: chronic illness Signs/Symptoms: severe muscle depletion, severe fat depletion Percent weight loss: 15 % (in 6 months) Interventions: Magic cup, Tube feeding, Liberalize Diet, Refer to RD note  for recommendations  Underweight: Estimated body mass index is 17.69 kg/m as calculated from the following:   Height as of this encounter: 5' 3 (1.6 m).   Weight as of this encounter: 45.3 kg.   Code status:   Code Status: Full Code   DVT Prophylaxis: heparin  injection 5,000 Units Start: 11/21/24 0600 SCD's Start: 10/23/24 1918   Family Communication: Spouse at bedside   Disposition Plan: Status is: Inpatient Remains inpatient appropriate because: Severity of illness   Planned Discharge Destination:Skilled nursing facility versus CIR.   Diet: Diet Order             Diet regular Room service appropriate? Yes with Assist; Fluid consistency: Thin  Diet effective now           Diet - low sodium heart healthy                     Antimicrobial agents: Anti-infectives (From admission, onward)    Start     Dose/Rate Route Frequency Ordered Stop   11/19/24 1200  ceFAZolin  (ANCEF ) IVPB 2g/100 mL premix  Status:  Discontinued       Note to Pharmacy: For IR procedure, g tube placement, tentative for tomorrow 12/30   2 g 200 mL/hr over 30 Minutes Intravenous  Once 11/18/24 1511 11/22/24 1131   11/19/24 0826  ceFAZolin  (ANCEF ) IVPB 2g/100 mL premix        over 30 Minutes Intravenous Continuous PRN 11/19/24 0835 11/19/24 0857   11/01/24 1200  piperacillin -tazobactam (ZOSYN ) IVPB 3.375 g        3.375 g 12.5 mL/hr over 240 Minutes Intravenous Every 8 hours 11/01/24 0822 11/04/24 0048   10/30/24 1400  piperacillin -tazobactam (ZOSYN ) IVPB 2.25 g  Status:  Discontinued        2.25 g 100 mL/hr over 30 Minutes Intravenous Every 8 hours 10/30/24 0939 11/01/24 0822   10/28/24 1600  ceFEPIme  (MAXIPIME ) 2 g in  sodium chloride  0.9 % 100 mL IVPB  Status:  Discontinued        2 g 200 mL/hr over 30 Minutes Intravenous Every 24 hours 10/28/24 1441 10/30/24 0939   10/26/24 1800  cefTRIAXone  (ROCEPHIN ) 2 g in sodium chloride  0.9 % 100 mL IVPB  Status:  Discontinued        2 g 200 mL/hr  over 30 Minutes Intravenous Daily-1800 10/26/24 1739 10/28/24 1441   10/23/24 0700  ceFAZolin  (ANCEF ) IVPB 2g/100 mL premix        2 g 200 mL/hr over 30 Minutes Intravenous On call to O.R. 10/23/24 9351 10/23/24 9166        MEDICATIONS: Scheduled Meds:  amLODipine   10 mg Per Tube Daily   aspirin  EC  81 mg Oral Daily   carvedilol   12.5 mg Per Tube BID WC   feeding supplement (OSMOLITE 1.5 CAL)  237 mL Per Tube TID   feeding supplement (PROSource TF20)  60 mL Per Tube Daily   free water   200 mL Per Tube Q6H   gabapentin   100 mg Per Tube Q12H   Gerhardt's butt cream   Topical BID   heparin  injection (subcutaneous)  5,000 Units Subcutaneous Q8H   hydrALAZINE   50 mg Per Tube Q8H   Influenza vac split trivalent PF  0.5 mL Intramuscular Tomorrow-1000   levothyroxine   75 mcg Per Tube Q0600   methylphenidate   5 mg Oral q morning   mouth rinse  15 mL Mouth Rinse 4 times per day   pantoprazole   40 mg Oral QHS   rosuvastatin   40 mg Oral Daily   Continuous Infusions:   PRN Meds:.acetaminophen , hydrALAZINE , hydrocortisone  cream, labetalol , loperamide , ondansetron  **OR** ondansetron  (ZOFRAN ) IV, mouth rinse, traMADol    I have personally reviewed following labs and imaging studies  LABORATORY DATA: CBC: Recent Labs  Lab 11/17/24 0550 11/18/24 0429 11/19/24 0401 11/20/24 0356  WBC 6.3 6.8 7.1 8.2  HGB 8.8* 8.6* 9.3* 8.8*  HCT 27.0* 25.7* 28.7* 26.8*  MCV 95.1 95.5 95.3 94.7  PLT 337 316 288 269    Basic Metabolic Panel: Recent Labs  Lab 11/17/24 0550 11/18/24 0429 11/19/24 0401 11/20/24 0356  NA 136 136 133* 135  K 4.3 4.1 4.3 3.8  CL 101 100 99 100  CO2 24 24 22 23   GLUCOSE 104* 113* 104* 108*  BUN 55* 57* 55* 42*  CREATININE 1.49* 1.58* 1.59* 1.56*  CALCIUM  9.5 9.5 9.5 9.5    GFR: Estimated Creatinine Clearance: 24.7 mL/min (A) (by C-G formula based on SCr of 1.56 mg/dL (H)).  Liver Function Tests: No results for input(s): AST, ALT, ALKPHOS, BILITOT,  PROT, ALBUMIN  in the last 168 hours.  No results for input(s): LIPASE, AMYLASE in the last 168 hours. No results for input(s): AMMONIA in the last 168 hours.  Coagulation Profile: Recent Labs  Lab 11/19/24 0401  INR 0.9    Cardiac Enzymes: No results for input(s): CKTOTAL, CKMB, CKMBINDEX, TROPONINI in the last 168 hours.  BNP (last 3 results) No results for input(s): PROBNP in the last 8760 hours.  Lipid Profile: No results for input(s): CHOL, HDL, LDLCALC, TRIG, CHOLHDL, LDLDIRECT in the last 72 hours.  Thyroid  Function Tests: No results for input(s): TSH, T4TOTAL, FREET4, T3FREE, THYROIDAB in the last 72 hours.  Anemia Panel: No results for input(s): VITAMINB12, FOLATE, FERRITIN, TIBC, IRON, RETICCTPCT in the last 72 hours.  Urine analysis:    Component Value Date/Time   COLORURINE STRAW (A) 10/29/2024 1730  APPEARANCEUR CLEAR 10/29/2024 1730   LABSPEC 1.009 10/29/2024 1730   PHURINE 8.0 10/29/2024 1730   GLUCOSEU 50 (A) 10/29/2024 1730   HGBUR SMALL (A) 10/29/2024 1730   BILIRUBINUR NEGATIVE 10/29/2024 1730   BILIRUBINUR negative 09/06/2018 1057   KETONESUR NEGATIVE 10/29/2024 1730   PROTEINUR 100 (A) 10/29/2024 1730   UROBILINOGEN 0.2 09/06/2018 1057   UROBILINOGEN 0.2 09/12/2015 1225   NITRITE NEGATIVE 10/29/2024 1730   LEUKOCYTESUR NEGATIVE 10/29/2024 1730    Sepsis Labs: Lactic Acid, Venous    Component Value Date/Time   LATICACIDVEN 1.8 10/24/2024 0610    MICROBIOLOGY: No results found for this or any previous visit (from the past 240 hours).  RADIOLOGY STUDIES/RESULTS: No results found.    LOS: 31 days   Donalda Applebaum, MD  [facility]    To contact the attending provider between 7A-7P or the covering provider during after hours 7P-7A, please log into the web site [email or web address] and access using universal [place] password for that web site. If you do not have the password,  please call the hospital operator.  11/23/2024, 10:32 AM    "

## 2024-11-23 NOTE — Plan of Care (Signed)

## 2024-11-24 DIAGNOSIS — E119 Type 2 diabetes mellitus without complications: Secondary | ICD-10-CM | POA: Diagnosis not present

## 2024-11-24 DIAGNOSIS — Z9889 Other specified postprocedural states: Secondary | ICD-10-CM | POA: Diagnosis not present

## 2024-11-24 DIAGNOSIS — R579 Shock, unspecified: Secondary | ICD-10-CM | POA: Diagnosis not present

## 2024-11-24 DIAGNOSIS — E440 Moderate protein-calorie malnutrition: Secondary | ICD-10-CM | POA: Diagnosis not present

## 2024-11-24 LAB — GLUCOSE, CAPILLARY
Glucose-Capillary: 95 mg/dL (ref 70–99)
Glucose-Capillary: 101 mg/dL — ABNORMAL HIGH (ref 70–99)
Glucose-Capillary: 92 mg/dL (ref 70–99)
Glucose-Capillary: 136 mg/dL — ABNORMAL HIGH (ref 70–99)
Glucose-Capillary: 106 mg/dL — ABNORMAL HIGH (ref 70–99)

## 2024-11-24 MED ORDER — LEVOTHYROXINE SODIUM 75 MCG PO TABS
75.0000 ug | ORAL_TABLET | Freq: Every day | ORAL | Status: DC
  Administered 2024-11-25: 75 ug via ORAL
  Filled 2024-11-24: qty 1

## 2024-11-24 MED ORDER — ACETAMINOPHEN 500 MG PO TABS
1000.0000 mg | ORAL_TABLET | Freq: Four times a day (QID) | ORAL | Status: DC | PRN
  Administered 2024-11-25: 1000 mg via ORAL
  Filled 2024-11-24: qty 2

## 2024-11-24 MED ORDER — AMLODIPINE BESYLATE 10 MG PO TABS
10.0000 mg | ORAL_TABLET | Freq: Every day | ORAL | Status: DC
  Administered 2024-11-25: 10 mg via ORAL
  Filled 2024-11-24: qty 1

## 2024-11-24 MED ORDER — HYDRALAZINE HCL 50 MG PO TABS
50.0000 mg | ORAL_TABLET | Freq: Three times a day (TID) | ORAL | Status: DC
  Administered 2024-11-24 – 2024-11-25 (×3): 50 mg via ORAL
  Filled 2024-11-24 (×3): qty 1

## 2024-11-24 MED ORDER — CARVEDILOL 12.5 MG PO TABS
12.5000 mg | ORAL_TABLET | Freq: Two times a day (BID) | ORAL | Status: DC
  Administered 2024-11-24 – 2024-11-25 (×2): 12.5 mg via ORAL
  Filled 2024-11-24 (×2): qty 1

## 2024-11-24 NOTE — Plan of Care (Signed)
 " Problem: Education: Goal: Knowledge of General Education information will improve Description: Including pain rating scale, medication(s)/side effects and non-pharmacologic comfort measures 11/24/2024 1527 by Veverly Heron BROCKS, RN Outcome: Progressing 11/24/2024 1449 by Veverly Heron BROCKS, RN Outcome: Progressing   Problem: Health Behavior/Discharge Planning: Goal: Ability to manage health-related needs will improve 11/24/2024 1527 by Veverly Heron BROCKS, RN Outcome: Progressing 11/24/2024 1449 by Veverly Heron BROCKS, RN Outcome: Progressing   Problem: Clinical Measurements: Goal: Ability to maintain clinical measurements within normal limits will improve 11/24/2024 1527 by Veverly Heron BROCKS, RN Outcome: Progressing 11/24/2024 1449 by Veverly Heron BROCKS, RN Outcome: Progressing Goal: Will remain free from infection 11/24/2024 1527 by Veverly Heron BROCKS, RN Outcome: Progressing 11/24/2024 1449 by Veverly Heron BROCKS, RN Outcome: Progressing Goal: Diagnostic test results will improve 11/24/2024 1527 by Veverly Heron BROCKS, RN Outcome: Progressing 11/24/2024 1449 by Veverly Heron BROCKS, RN Outcome: Progressing Goal: Respiratory complications will improve 11/24/2024 1527 by Veverly Heron BROCKS, RN Outcome: Progressing 11/24/2024 1449 by Veverly Heron BROCKS, RN Outcome: Progressing Goal: Cardiovascular complication will be avoided 11/24/2024 1527 by Veverly Heron BROCKS, RN Outcome: Progressing 11/24/2024 1449 by Veverly Heron BROCKS, RN Outcome: Progressing   Problem: Activity: Goal: Risk for activity intolerance will decrease 11/24/2024 1527 by Veverly Heron BROCKS, RN Outcome: Progressing 11/24/2024 1449 by Veverly Heron BROCKS, RN Outcome: Progressing   Problem: Nutrition: Goal: Adequate nutrition will be maintained 11/24/2024 1527 by Veverly Heron BROCKS, RN Outcome: Progressing 11/24/2024 1449 by Veverly Heron BROCKS, RN Outcome: Progressing   Problem: Coping: Goal: Level of anxiety will decrease 11/24/2024  1527 by Veverly Heron BROCKS, RN Outcome: Progressing 11/24/2024 1449 by Veverly Heron BROCKS, RN Outcome: Progressing   Problem: Elimination: Goal: Will not experience complications related to bowel motility 11/24/2024 1527 by Veverly Heron BROCKS, RN Outcome: Progressing 11/24/2024 1449 by Veverly Heron BROCKS, RN Outcome: Progressing Goal: Will not experience complications related to urinary retention 11/24/2024 1527 by Veverly Heron BROCKS, RN Outcome: Progressing 11/24/2024 1449 by Veverly Heron BROCKS, RN Outcome: Progressing   Problem: Pain Managment: Goal: General experience of comfort will improve and/or be controlled 11/24/2024 1527 by Veverly Heron BROCKS, RN Outcome: Progressing 11/24/2024 1449 by Veverly Heron BROCKS, RN Outcome: Progressing   Problem: Skin Integrity: Goal: Risk for impaired skin integrity will decrease 11/24/2024 1527 by Veverly Heron BROCKS, RN Outcome: Progressing 11/24/2024 1449 by Veverly Heron BROCKS, RN Outcome: Progressing   Problem: Activity: Goal: Ability to tolerate increased activity will improve 11/24/2024 1527 by Veverly Heron BROCKS, RN Outcome: Progressing 11/24/2024 1449 by Veverly Heron BROCKS, RN Outcome: Progressing   Problem: Respiratory: Goal: Ability to maintain a clear airway and adequate ventilation will improve 11/24/2024 1527 by Veverly Heron BROCKS, RN Outcome: Progressing 11/24/2024 1449 by Veverly Heron BROCKS, RN Outcome: Progressing   Problem: Role Relationship: Goal: Method of communication will improve 11/24/2024 1527 by Veverly Heron BROCKS, RN Outcome: Progressing 11/24/2024 1449 by Veverly Heron BROCKS, RN Outcome: Progressing   Problem: Education: Goal: Knowledge of disease or condition will improve 11/24/2024 1527 by Veverly Heron BROCKS, RN Outcome: Progressing 11/24/2024 1449 by Veverly Heron BROCKS, RN Outcome: Progressing Goal: Knowledge of secondary prevention will improve (MUST DOCUMENT ALL) 11/24/2024 1527 by Veverly Heron BROCKS, RN Outcome:  Progressing 11/24/2024 1449 by Veverly Heron BROCKS, RN Outcome: Progressing Goal: Knowledge of patient specific risk factors will improve (DELETE if not current risk factor) 11/24/2024 1527 by Veverly Heron BROCKS, RN Outcome: Progressing 11/24/2024 1449 by Veverly Heron BROCKS, RN Outcome: Progressing   Problem: Ischemic Stroke/TIA Tissue  Perfusion: Goal: Complications of ischemic stroke/TIA will be minimized 11/24/2024 1527 by Veverly Heron BROCKS, RN Outcome: Progressing 11/24/2024 1449 by Veverly Heron BROCKS, RN Outcome: Progressing   Problem: Coping: Goal: Will verbalize positive feelings about self 11/24/2024 1527 by Veverly Heron BROCKS, RN Outcome: Progressing 11/24/2024 1449 by Veverly Heron BROCKS, RN Outcome: Progressing Goal: Will identify appropriate support needs 11/24/2024 1527 by Veverly Heron BROCKS, RN Outcome: Progressing 11/24/2024 1449 by Veverly Heron BROCKS, RN Outcome: Progressing   Problem: Health Behavior/Discharge Planning: Goal: Ability to manage health-related needs will improve 11/24/2024 1527 by Veverly Heron BROCKS, RN Outcome: Progressing 11/24/2024 1449 by Veverly Heron BROCKS, RN Outcome: Progressing Goal: Goals will be collaboratively established with patient/family 11/24/2024 1527 by Veverly Heron BROCKS, RN Outcome: Progressing 11/24/2024 1449 by Veverly Heron BROCKS, RN Outcome: Progressing   Problem: Self-Care: Goal: Ability to participate in self-care as condition permits will improve 11/24/2024 1527 by Veverly Heron BROCKS, RN Outcome: Progressing 11/24/2024 1449 by Veverly Heron BROCKS, RN Outcome: Progressing Goal: Verbalization of feelings and concerns over difficulty with self-care will improve 11/24/2024 1527 by Veverly Heron BROCKS, RN Outcome: Progressing 11/24/2024 1449 by Veverly Heron BROCKS, RN Outcome: Progressing Goal: Ability to communicate needs accurately will improve 11/24/2024 1527 by Veverly Heron BROCKS, RN Outcome: Progressing 11/24/2024 1449 by Veverly Heron BROCKS,  RN Outcome: Progressing   Problem: Education: Goal: Knowledge of disease or condition will improve 11/24/2024 1527 by Veverly Heron BROCKS, RN Outcome: Progressing 11/24/2024 1449 by Veverly Heron BROCKS, RN Outcome: Progressing Goal: Knowledge of secondary prevention will improve (MUST DOCUMENT ALL) 11/24/2024 1527 by Veverly Heron BROCKS, RN Outcome: Progressing 11/24/2024 1449 by Veverly Heron BROCKS, RN Outcome: Progressing Goal: Knowledge of patient specific risk factors will improve (DELETE if not current risk factor) 11/24/2024 1527 by Veverly Heron BROCKS, RN Outcome: Progressing 11/24/2024 1449 by Veverly Heron BROCKS, RN Outcome: Progressing   Problem: Ischemic Stroke/TIA Tissue Perfusion: Goal: Complications of ischemic stroke/TIA will be minimized 11/24/2024 1527 by Veverly Heron BROCKS, RN Outcome: Progressing 11/24/2024 1449 by Veverly Heron BROCKS, RN Outcome: Progressing   Problem: Coping: Goal: Will verbalize positive feelings about self 11/24/2024 1527 by Veverly Heron BROCKS, RN Outcome: Progressing 11/24/2024 1449 by Veverly Heron BROCKS, RN Outcome: Progressing   Problem: Coping: Goal: Will verbalize positive feelings about self 11/24/2024 1527 by Veverly Heron BROCKS, RN Outcome: Progressing 11/24/2024 1449 by Veverly Heron BROCKS, RN Outcome: Progressing Goal: Will identify appropriate support needs 11/24/2024 1527 by Veverly Heron BROCKS, RN Outcome: Progressing 11/24/2024 1449 by Veverly Heron BROCKS, RN Outcome: Progressing   Problem: Health Behavior/Discharge Planning: Goal: Ability to manage health-related needs will improve 11/24/2024 1527 by Veverly Heron BROCKS, RN Outcome: Progressing 11/24/2024 1449 by Veverly Heron BROCKS, RN Outcome: Progressing Goal: Goals will be collaboratively established with patient/family 11/24/2024 1527 by Veverly Heron BROCKS, RN Outcome: Progressing 11/24/2024 1449 by Veverly Heron BROCKS, RN Outcome: Progressing   "

## 2024-11-24 NOTE — Plan of Care (Signed)
" °  Problem: Education: Goal: Knowledge of General Education information will improve Description: Including pain rating scale, medication(s)/side effects and non-pharmacologic comfort measures Outcome: Progressing   Problem: Health Behavior/Discharge Planning: Goal: Ability to manage health-related needs will improve Outcome: Progressing   Problem: Clinical Measurements: Goal: Ability to maintain clinical measurements within normal limits will improve Outcome: Progressing Goal: Will remain free from infection Outcome: Progressing Goal: Diagnostic test results will improve Outcome: Progressing Goal: Respiratory complications will improve Outcome: Progressing Goal: Cardiovascular complication will be avoided Outcome: Progressing   Problem: Activity: Goal: Risk for activity intolerance will decrease Outcome: Progressing   Problem: Nutrition: Goal: Adequate nutrition will be maintained Outcome: Progressing   Problem: Coping: Goal: Level of anxiety will decrease Outcome: Progressing   Problem: Elimination: Goal: Will not experience complications related to bowel motility Outcome: Progressing Goal: Will not experience complications related to urinary retention Outcome: Progressing   Problem: Pain Managment: Goal: General experience of comfort will improve and/or be controlled Outcome: Progressing   Problem: Safety: Goal: Ability to remain free from injury will improve Outcome: Progressing   Problem: Skin Integrity: Goal: Risk for impaired skin integrity will decrease Outcome: Progressing   Problem: Activity: Goal: Ability to tolerate increased activity will improve Outcome: Progressing   Problem: Respiratory: Goal: Ability to maintain a clear airway and adequate ventilation will improve Outcome: Progressing   Problem: Role Relationship: Goal: Method of communication will improve Outcome: Progressing   Problem: Education: Goal: Knowledge of disease or condition  will improve Outcome: Progressing Goal: Knowledge of secondary prevention will improve (MUST DOCUMENT ALL) Outcome: Progressing Goal: Knowledge of patient specific risk factors will improve (DELETE if not current risk factor) Outcome: Progressing   Problem: Ischemic Stroke/TIA Tissue Perfusion: Goal: Complications of ischemic stroke/TIA will be minimized Outcome: Progressing   Problem: Coping: Goal: Will verbalize positive feelings about self Outcome: Progressing Goal: Will identify appropriate support needs Outcome: Progressing   Problem: Health Behavior/Discharge Planning: Goal: Ability to manage health-related needs will improve Outcome: Progressing Goal: Goals will be collaboratively established with patient/family Outcome: Progressing   "

## 2024-11-24 NOTE — Progress Notes (Signed)
 "                        PROGRESS NOTE        PATIENT DETAILS Name: Erin [name] Age: 69 y.o. Sex: female Date of Birth: 06/01/1956 Admit Date: 10/23/2024 Admitting Physician Lynda Leos, MD [name], Heron [name], MD  Brief Summary: Patient is a 69 y.o.  female with history of CAD s/p PCI, PAD s/p B/L femoral bypass 2014-mesenteric stent placement 08/2024, DM-2, HTN, HLD, anal cancer-s/p chemo/XRT 2014-who was initially admitted by [facility] for incisional hernia on 12/3-unfortunately-developed postoperative hemorrhagic shock in the setting of hemoperitoneum requiring intubation/pressors and transferred to ICU.  Further hospital course complicated by AKI, B/L CVA in the setting of hypotension.  She was stabilized in the ICU-subsequently transferred to [facility] on 12/15.  See below for further details.  Significant events: 12/3>> incisional hernia repair-developed postoperative hypotension and acute blood loss anemia-Enterline placed-transferred to ICU  12/4>> back to operating room-hemoperitoneum-active bleed not identified-abdomen packed-wound VAC placed-left intubated-transferred back to the ICU with open abdomen. 12/5>> back to the operating room-no evidence of bleeding-abdomen closed 12/8>> neuroimaging suggestive of CVA 12/11>> extubated 12/15>> transferred to [facility] 12/30>> PEG by IR  Significant studies: 11/28>> A1c: 4.9 12/4>> CT angio abdomen/pelvis: Large right intraperitoneal hematoma with active bleeding from the right epigastric artery, left renal artery occluded at the origin with minimal enhancement.  Severe narrowing of the SMA immediately distal to the stent which is patent. 12/8>> MRI brain: Scattered acute infarcts in B/L cerebral hemisphere/cerebellum. 12/8>> MRI brain: No LVO-no significant intra or cranial stenosis. 12/8>> echo: EF 60-65% 12/9>> carotid Doppler: No significant stenosis 12/9>> LDL: 13 12/24>> CT abdomen: Safe percutaneous window for gastrostomy  placement  Significant microbiology data: 12/6>> tracheal aspirate: Enterobacter cloacae 12/9>> tracheal aspirate: Respiratory flora 12/13>> stool C. difficile: Negative 12/13>> GI pathogen panel: No growth  Procedures: See above  Consults: General Surgery PCCM Nephrology Neurology IR  Subjective: No major issues overnight-lying comfortably in bed.  Awaiting CIR-insurance authorization.  Objective: Vitals: Blood pressure (!) 125/99, pulse 83, temperature 98 F (36.7 C), temperature source Oral, resp. rate 16, height 5' 3 (1.6 m), weight 45.3 kg, SpO2 100%.   Exam: Awake/alert Nonfocal exam Speech clear Extremities: No edema  Pertinent Labs/Radiology:    Latest Ref Rng & Units 11/20/2024    3:56 AM 11/19/2024    4:01 AM 11/18/2024    4:29 AM  CBC  WBC 4.0 - 10.5 K/uL 8.2  7.1  6.8   Hemoglobin 12.0 - 15.0 g/dL 8.8  9.3  8.6   Hematocrit 36.0 - 46.0 % 26.8  28.7  25.7   Platelets 150 - 400 K/uL 269  288  316     Lab Results  Component Value Date   NA 135 11/20/2024   K 3.8 11/20/2024   CL 100 11/20/2024   CO2 23 11/20/2024      Assessment/Plan: Postoperative hemorrhagic shock with acute blood loss anemia following incisional hernia repair on 12/3 Resolved-Hb/BP now stable. Did require massive transfusion protocol with 4 units of PRBC/2 units of cryoprecipitate/1 unit of FFP and vasopressors for resuscitation.  S/p elective incisional hernia repaiR 12/3-complicated by intra-abdominal hematoma/postoperative hemorrhagic shock requiring reexploration on 12/4 and 12/5 Postop care managed by general surgery Surgery team following intermittently  AKI on CKD stage IIIb Secondary to ATN-from hemorrhagic shock Renal function improving with supportive care  Right renal infarct Seen incidentally on CT imaging on 12/4-likely due to hypoperfusion  in setting of hypotension Supportive care at this point.  Acute hypoxic respiratory failure Intubated 12/4-extubated  12/11 Currently on room air  HCAP (Enterobacter cloacae on tracheal aspirate culture) Completed a course of antibiotics  Acute bilateral cerebellar/cerebral infarct secondary to profound intracranial hypoperfusion due to hemorrhagic shock/hypotension. Workup as above Oropharyngeal dysphagia-improved but oral intake is poor-underwent PEG tube placement 12/30 Significant improvement in aphasia-almost able to talk fluently and in full sentences. Continue ASA  Oropharyngeal dysphagia Secondary to CVA/anoxic injury Followed by SLP-tolerating dysphagia 3 diet PEG tube inserted on 12/30 to supplement caloric intake (per prior notes-oral intake erratic/poor)-seems to be tolerating nocturnal tube feeds well. Continue to encourage oral intake during the daytime.  Acute metabolic encephalopathy Secondary to acute/critical illness/CVA/potential anoxic injury from hypotension Thankfully-significantly improved after treatment of underlying etiologies Delirium precautions  Multifactorial anemia Secondary to hemorrhagic shock-critical illness Hb stable Follow CBC periodically  History of left renal artery stenosis Supportive care  History of PAD-s/p prior aortobifem surgery 2014-stenting of SMA October 2025 (Dr. Serene) Supportive care On aspirin   History of peripheral neuropathy On Neurontin -significantly higher doses at home-cut back currently due to encephalopathy  HTN BP stable Amlodipine /Coreg /hydralazine   HLD Statin  Hypothyroidism Synthroid   DVT/deconditioning Secondary to critical illness SNF recommended but now CIR also taking a look.  Nutrition Status: Nutrition Problem: Severe Malnutrition Etiology: chronic illness Signs/Symptoms: severe muscle depletion, severe fat depletion Percent weight loss: 15 % (in 6 months) Interventions: Magic cup, Tube feeding, Liberalize Diet, Refer to RD note for recommendations  Underweight: Estimated body mass index is 17.69 kg/m  as calculated from the following:   Height as of this encounter: 5' 3 (1.6 m).   Weight as of this encounter: 45.3 kg.   Code status:   Code Status: Full Code   DVT Prophylaxis: heparin  injection 5,000 Units Start: 11/21/24 0600 SCD's Start: 10/23/24 1918   Family Communication: Spouse at bedside   Disposition Plan: Status is: Inpatient Remains inpatient appropriate because: Severity of illness   Planned Discharge Destination:Skilled nursing facility versus CIR.   Diet: Diet Order             Diet regular Room service appropriate? Yes with Assist; Fluid consistency: Thin  Diet effective now           Diet - low sodium heart healthy                     Antimicrobial agents: Anti-infectives (From admission, onward)    Start     Dose/Rate Route Frequency Ordered Stop   11/19/24 1200  ceFAZolin  (ANCEF ) IVPB 2g/100 mL premix  Status:  Discontinued       Note to Pharmacy: For IR procedure, g tube placement, tentative for tomorrow 12/30   2 g 200 mL/hr over 30 Minutes Intravenous  Once 11/18/24 1511 11/22/24 1131   11/19/24 0826  ceFAZolin  (ANCEF ) IVPB 2g/100 mL premix        over 30 Minutes Intravenous Continuous PRN 11/19/24 0835 11/19/24 0857   11/01/24 1200  piperacillin -tazobactam (ZOSYN ) IVPB 3.375 g        3.375 g 12.5 mL/hr over 240 Minutes Intravenous Every 8 hours 11/01/24 0822 11/04/24 0048   10/30/24 1400  piperacillin -tazobactam (ZOSYN ) IVPB 2.25 g  Status:  Discontinued        2.25 g 100 mL/hr over 30 Minutes Intravenous Every 8 hours 10/30/24 0939 11/01/24 0822   10/28/24 1600  ceFEPIme  (MAXIPIME ) 2 g in sodium chloride  0.9 % 100 mL IVPB  Status:  Discontinued        2 g 200 mL/hr over 30 Minutes Intravenous Every 24 hours 10/28/24 1441 10/30/24 0939   10/26/24 1800  cefTRIAXone  (ROCEPHIN ) 2 g in sodium chloride  0.9 % 100 mL IVPB  Status:  Discontinued        2 g 200 mL/hr over 30 Minutes Intravenous Daily-1800 10/26/24 1739 10/28/24 1441   10/23/24  0700  ceFAZolin  (ANCEF ) IVPB 2g/100 mL premix        2 g 200 mL/hr over 30 Minutes Intravenous On call to O.R. 10/23/24 9351 10/23/24 9166        MEDICATIONS: Scheduled Meds:  amLODipine   10 mg Per Tube Daily   aspirin  EC  81 mg Oral Daily   carvedilol   12.5 mg Per Tube BID WC   feeding supplement (OSMOLITE 1.5 CAL)  237 mL Per Tube TID   feeding supplement (PROSource TF20)  60 mL Per Tube Daily   free water   200 mL Per Tube Q6H   gabapentin   100 mg Per Tube Q12H   Gerhardt's butt cream   Topical BID   heparin  injection (subcutaneous)  5,000 Units Subcutaneous Q8H   hydrALAZINE   50 mg Per Tube Q8H   Influenza vac split trivalent PF  0.5 mL Intramuscular Tomorrow-1000   levothyroxine   75 mcg Per Tube Q0600   methylphenidate   5 mg Oral q morning   mouth rinse  15 mL Mouth Rinse 4 times per day   pantoprazole   40 mg Oral QHS   rosuvastatin   40 mg Oral Daily   Continuous Infusions:   PRN Meds:.acetaminophen , hydrALAZINE , hydrocortisone  cream, labetalol , loperamide , ondansetron  **OR** ondansetron  (ZOFRAN ) IV, mouth rinse, traMADol    I have personally reviewed following labs and imaging studies  LABORATORY DATA: CBC: Recent Labs  Lab 11/18/24 0429 11/19/24 0401 11/20/24 0356  WBC 6.8 7.1 8.2  HGB 8.6* 9.3* 8.8*  HCT 25.7* 28.7* 26.8*  MCV 95.5 95.3 94.7  PLT 316 288 269    Basic Metabolic Panel: Recent Labs  Lab 11/18/24 0429 11/19/24 0401 11/20/24 0356  NA 136 133* 135  K 4.1 4.3 3.8  CL 100 99 100  CO2 24 22 23   GLUCOSE 113* 104* 108*  BUN 57* 55* 42*  CREATININE 1.58* 1.59* 1.56*  CALCIUM  9.5 9.5 9.5    GFR: Estimated Creatinine Clearance: 24.7 mL/min (A) (by C-G formula based on SCr of 1.56 mg/dL (H)).  Liver Function Tests: No results for input(s): AST, ALT, ALKPHOS, BILITOT, PROT, ALBUMIN  in the last 168 hours.  No results for input(s): LIPASE, AMYLASE in the last 168 hours. No results for input(s): AMMONIA in the last 168  hours.  Coagulation Profile: Recent Labs  Lab 11/19/24 0401  INR 0.9    Cardiac Enzymes: No results for input(s): CKTOTAL, CKMB, CKMBINDEX, TROPONINI in the last 168 hours.  BNP (last 3 results) No results for input(s): PROBNP in the last 8760 hours.  Lipid Profile: No results for input(s): CHOL, HDL, LDLCALC, TRIG, CHOLHDL, LDLDIRECT in the last 72 hours.  Thyroid  Function Tests: No results for input(s): TSH, T4TOTAL, FREET4, T3FREE, THYROIDAB in the last 72 hours.  Anemia Panel: No results for input(s): VITAMINB12, FOLATE, FERRITIN, TIBC, IRON, RETICCTPCT in the last 72 hours.  Urine analysis:    Component Value Date/Time   COLORURINE STRAW (A) 10/29/2024 1730   APPEARANCEUR CLEAR 10/29/2024 1730   LABSPEC 1.009 10/29/2024 1730   PHURINE 8.0 10/29/2024 1730   GLUCOSEU 50 (A) 10/29/2024 1730  HGBUR SMALL (A) 10/29/2024 1730   BILIRUBINUR NEGATIVE 10/29/2024 1730   BILIRUBINUR negative 09/06/2018 1057   KETONESUR NEGATIVE 10/29/2024 1730   PROTEINUR 100 (A) 10/29/2024 1730   UROBILINOGEN 0.2 09/06/2018 1057   UROBILINOGEN 0.2 09/12/2015 1225   NITRITE NEGATIVE 10/29/2024 1730   LEUKOCYTESUR NEGATIVE 10/29/2024 1730    Sepsis Labs: Lactic Acid, Venous    Component Value Date/Time   LATICACIDVEN 1.8 10/24/2024 0610    MICROBIOLOGY: No results found for this or any previous visit (from the past 240 hours).  RADIOLOGY STUDIES/RESULTS: No results found.    LOS: 32 days   Donalda Applebaum, MD  [facility]    To contact the attending provider between 7A-7P or the covering provider during after hours 7P-7A, please log into the web site [email or web address] and access using universal [place] password for that web site. If you do not have the password, please call the hospital operator.  11/24/2024, 10:38 AM    "

## 2024-11-25 ENCOUNTER — Inpatient Hospital Stay (HOSPITAL_COMMUNITY)
Admission: AD | Admit: 2024-11-25 | Discharge: 2024-12-10 | DRG: 056 | Disposition: A | Discharge status: Home / Self Care | Source: Intra-hospital | Attending: Physical Medicine and Rehabilitation | Admitting: Physical Medicine and Rehabilitation

## 2024-11-25 ENCOUNTER — Other Ambulatory Visit: Payer: Self-pay

## 2024-11-25 DIAGNOSIS — I10 Essential (primary) hypertension: Secondary | ICD-10-CM | POA: Diagnosis not present

## 2024-11-25 DIAGNOSIS — I5032 Chronic diastolic (congestive) heart failure: Secondary | ICD-10-CM | POA: Diagnosis present

## 2024-11-25 DIAGNOSIS — R351 Nocturia: Secondary | ICD-10-CM | POA: Diagnosis not present

## 2024-11-25 DIAGNOSIS — N1832 Chronic kidney disease, stage 3b: Secondary | ICD-10-CM | POA: Diagnosis present

## 2024-11-25 DIAGNOSIS — R1312 Dysphagia, oropharyngeal phase: Secondary | ICD-10-CM | POA: Diagnosis present

## 2024-11-25 DIAGNOSIS — I639 Cerebral infarction, unspecified: Secondary | ICD-10-CM | POA: Diagnosis not present

## 2024-11-25 DIAGNOSIS — Z681 Body mass index (BMI) 19 or less, adult: Secondary | ICD-10-CM

## 2024-11-25 DIAGNOSIS — K5901 Slow transit constipation: Secondary | ICD-10-CM | POA: Diagnosis not present

## 2024-11-25 DIAGNOSIS — Z9889 Other specified postprocedural states: Secondary | ICD-10-CM | POA: Insufficient documentation

## 2024-11-25 DIAGNOSIS — F32A Depression, unspecified: Secondary | ICD-10-CM | POA: Diagnosis present

## 2024-11-25 DIAGNOSIS — Z8042 Family history of malignant neoplasm of prostate: Secondary | ICD-10-CM

## 2024-11-25 DIAGNOSIS — K592 Neurogenic bowel, not elsewhere classified: Secondary | ICD-10-CM | POA: Diagnosis present

## 2024-11-25 DIAGNOSIS — M792 Neuralgia and neuritis, unspecified: Secondary | ICD-10-CM | POA: Diagnosis not present

## 2024-11-25 DIAGNOSIS — E7849 Other hyperlipidemia: Secondary | ICD-10-CM | POA: Diagnosis present

## 2024-11-25 DIAGNOSIS — Z9221 Personal history of antineoplastic chemotherapy: Secondary | ICD-10-CM

## 2024-11-25 DIAGNOSIS — I69351 Hemiplegia and hemiparesis following cerebral infarction affecting right dominant side: Secondary | ICD-10-CM | POA: Diagnosis not present

## 2024-11-25 DIAGNOSIS — K9421 Gastrostomy hemorrhage: Secondary | ICD-10-CM | POA: Diagnosis not present

## 2024-11-25 DIAGNOSIS — I69391 Dysphagia following cerebral infarction: Secondary | ICD-10-CM

## 2024-11-25 DIAGNOSIS — N319 Neuromuscular dysfunction of bladder, unspecified: Secondary | ICD-10-CM | POA: Diagnosis present

## 2024-11-25 DIAGNOSIS — K219 Gastro-esophageal reflux disease without esophagitis: Secondary | ICD-10-CM | POA: Diagnosis present

## 2024-11-25 DIAGNOSIS — E1142 Type 2 diabetes mellitus with diabetic polyneuropathy: Secondary | ICD-10-CM | POA: Diagnosis present

## 2024-11-25 DIAGNOSIS — Z8249 Family history of ischemic heart disease and other diseases of the circulatory system: Secondary | ICD-10-CM

## 2024-11-25 DIAGNOSIS — Z8701 Personal history of pneumonia (recurrent): Secondary | ICD-10-CM

## 2024-11-25 DIAGNOSIS — I429 Cardiomyopathy, unspecified: Secondary | ICD-10-CM | POA: Diagnosis present

## 2024-11-25 DIAGNOSIS — N179 Acute kidney failure, unspecified: Secondary | ICD-10-CM | POA: Diagnosis not present

## 2024-11-25 DIAGNOSIS — E1169 Type 2 diabetes mellitus with other specified complication: Secondary | ICD-10-CM | POA: Diagnosis present

## 2024-11-25 DIAGNOSIS — I2510 Atherosclerotic heart disease of native coronary artery without angina pectoris: Secondary | ICD-10-CM | POA: Diagnosis present

## 2024-11-25 DIAGNOSIS — E43 Unspecified severe protein-calorie malnutrition: Secondary | ICD-10-CM | POA: Diagnosis present

## 2024-11-25 DIAGNOSIS — I69322 Dysarthria following cerebral infarction: Secondary | ICD-10-CM

## 2024-11-25 DIAGNOSIS — Z96641 Presence of right artificial hip joint: Secondary | ICD-10-CM | POA: Diagnosis present

## 2024-11-25 DIAGNOSIS — E1151 Type 2 diabetes mellitus with diabetic peripheral angiopathy without gangrene: Secondary | ICD-10-CM | POA: Diagnosis present

## 2024-11-25 DIAGNOSIS — M549 Dorsalgia, unspecified: Secondary | ICD-10-CM | POA: Diagnosis present

## 2024-11-25 DIAGNOSIS — Z7902 Long term (current) use of antithrombotics/antiplatelets: Secondary | ICD-10-CM | POA: Diagnosis not present

## 2024-11-25 DIAGNOSIS — Z8261 Family history of arthritis: Secondary | ICD-10-CM

## 2024-11-25 DIAGNOSIS — Z955 Presence of coronary angioplasty implant and graft: Secondary | ICD-10-CM

## 2024-11-25 DIAGNOSIS — E440 Moderate protein-calorie malnutrition: Secondary | ICD-10-CM | POA: Diagnosis present

## 2024-11-25 DIAGNOSIS — I771 Stricture of artery: Secondary | ICD-10-CM | POA: Diagnosis present

## 2024-11-25 DIAGNOSIS — E1122 Type 2 diabetes mellitus with diabetic chronic kidney disease: Secondary | ICD-10-CM | POA: Diagnosis present

## 2024-11-25 DIAGNOSIS — I739 Peripheral vascular disease, unspecified: Secondary | ICD-10-CM | POA: Diagnosis present

## 2024-11-25 DIAGNOSIS — Z888 Allergy status to other drugs, medicaments and biological substances status: Secondary | ICD-10-CM

## 2024-11-25 DIAGNOSIS — K5900 Constipation, unspecified: Secondary | ICD-10-CM | POA: Insufficient documentation

## 2024-11-25 DIAGNOSIS — Z91040 Latex allergy status: Secondary | ICD-10-CM

## 2024-11-25 DIAGNOSIS — F419 Anxiety disorder, unspecified: Secondary | ICD-10-CM | POA: Diagnosis present

## 2024-11-25 DIAGNOSIS — E785 Hyperlipidemia, unspecified: Secondary | ICD-10-CM | POA: Diagnosis present

## 2024-11-25 DIAGNOSIS — Z85048 Personal history of other malignant neoplasm of rectum, rectosigmoid junction, and anus: Secondary | ICD-10-CM

## 2024-11-25 DIAGNOSIS — Z860101 Personal history of adenomatous and serrated colon polyps: Secondary | ICD-10-CM

## 2024-11-25 DIAGNOSIS — E039 Hypothyroidism, unspecified: Secondary | ICD-10-CM | POA: Diagnosis present

## 2024-11-25 DIAGNOSIS — R3915 Urgency of urination: Secondary | ICD-10-CM | POA: Diagnosis not present

## 2024-11-25 DIAGNOSIS — E876 Hypokalemia: Secondary | ICD-10-CM | POA: Diagnosis present

## 2024-11-25 DIAGNOSIS — D62 Acute posthemorrhagic anemia: Secondary | ICD-10-CM | POA: Diagnosis present

## 2024-11-25 DIAGNOSIS — G2581 Restless legs syndrome: Secondary | ICD-10-CM | POA: Diagnosis present

## 2024-11-25 DIAGNOSIS — Z87891 Personal history of nicotine dependence: Secondary | ICD-10-CM

## 2024-11-25 DIAGNOSIS — D519 Vitamin B12 deficiency anemia, unspecified: Secondary | ICD-10-CM | POA: Diagnosis present

## 2024-11-25 DIAGNOSIS — Z7982 Long term (current) use of aspirin: Secondary | ICD-10-CM

## 2024-11-25 DIAGNOSIS — E1140 Type 2 diabetes mellitus with diabetic neuropathy, unspecified: Secondary | ICD-10-CM | POA: Diagnosis present

## 2024-11-25 DIAGNOSIS — Z8349 Family history of other endocrine, nutritional and metabolic diseases: Secondary | ICD-10-CM

## 2024-11-25 DIAGNOSIS — Z882 Allergy status to sulfonamides status: Secondary | ICD-10-CM

## 2024-11-25 DIAGNOSIS — M25561 Pain in right knee: Secondary | ICD-10-CM | POA: Diagnosis present

## 2024-11-25 DIAGNOSIS — Z79890 Hormone replacement therapy (postmenopausal): Secondary | ICD-10-CM

## 2024-11-25 DIAGNOSIS — I69390 Apraxia following cerebral infarction: Secondary | ICD-10-CM

## 2024-11-25 DIAGNOSIS — K551 Chronic vascular disorders of intestine: Secondary | ICD-10-CM | POA: Diagnosis present

## 2024-11-25 DIAGNOSIS — Z9181 History of falling: Secondary | ICD-10-CM

## 2024-11-25 DIAGNOSIS — R112 Nausea with vomiting, unspecified: Secondary | ICD-10-CM | POA: Diagnosis not present

## 2024-11-25 DIAGNOSIS — Z4889 Encounter for other specified surgical aftercare: Secondary | ICD-10-CM

## 2024-11-25 DIAGNOSIS — Z801 Family history of malignant neoplasm of trachea, bronchus and lung: Secondary | ICD-10-CM

## 2024-11-25 DIAGNOSIS — R110 Nausea: Secondary | ICD-10-CM | POA: Diagnosis not present

## 2024-11-25 DIAGNOSIS — I130 Hypertensive heart and chronic kidney disease with heart failure and stage 1 through stage 4 chronic kidney disease, or unspecified chronic kidney disease: Secondary | ICD-10-CM | POA: Diagnosis present

## 2024-11-25 DIAGNOSIS — G63 Polyneuropathy in diseases classified elsewhere: Secondary | ICD-10-CM | POA: Diagnosis present

## 2024-11-25 DIAGNOSIS — R350 Frequency of micturition: Secondary | ICD-10-CM | POA: Insufficient documentation

## 2024-11-25 DIAGNOSIS — I6340 Cerebral infarction due to embolism of unspecified cerebral artery: Secondary | ICD-10-CM

## 2024-11-25 DIAGNOSIS — Z83438 Family history of other disorder of lipoprotein metabolism and other lipidemia: Secondary | ICD-10-CM

## 2024-11-25 DIAGNOSIS — I69393 Ataxia following cerebral infarction: Secondary | ICD-10-CM

## 2024-11-25 DIAGNOSIS — I129 Hypertensive chronic kidney disease with stage 1 through stage 4 chronic kidney disease, or unspecified chronic kidney disease: Secondary | ICD-10-CM | POA: Diagnosis present

## 2024-11-25 DIAGNOSIS — Z923 Personal history of irradiation: Secondary | ICD-10-CM

## 2024-11-25 DIAGNOSIS — Z823 Family history of stroke: Secondary | ICD-10-CM

## 2024-11-25 DIAGNOSIS — Z95820 Peripheral vascular angioplasty status with implants and grafts: Secondary | ICD-10-CM

## 2024-11-25 DIAGNOSIS — G8929 Other chronic pain: Secondary | ICD-10-CM | POA: Diagnosis present

## 2024-11-25 DIAGNOSIS — Z8719 Personal history of other diseases of the digestive system: Secondary | ICD-10-CM

## 2024-11-25 DIAGNOSIS — R5381 Other malaise: Secondary | ICD-10-CM | POA: Diagnosis present

## 2024-11-25 DIAGNOSIS — Z79899 Other long term (current) drug therapy: Secondary | ICD-10-CM

## 2024-11-25 LAB — GLUCOSE, CAPILLARY
Glucose-Capillary: 111 mg/dL — ABNORMAL HIGH (ref 70–99)
Glucose-Capillary: 113 mg/dL — ABNORMAL HIGH (ref 70–99)
Glucose-Capillary: 114 mg/dL — ABNORMAL HIGH (ref 70–99)
Glucose-Capillary: 114 mg/dL — ABNORMAL HIGH (ref 70–99)
Glucose-Capillary: 127 mg/dL — ABNORMAL HIGH (ref 70–99)

## 2024-11-25 MED ORDER — GABAPENTIN 250 MG/5ML PO SOLN
100.0000 mg | Freq: Two times a day (BID) | ORAL | Status: DC
  Filled 2024-11-25: qty 2

## 2024-11-25 MED ORDER — LEVOTHYROXINE SODIUM 75 MCG PO TABS
75.0000 ug | ORAL_TABLET | Freq: Every day | ORAL | Status: DC
  Administered 2024-11-26 – 2024-12-10 (×15): 75 ug via ORAL
  Filled 2024-11-25 (×15): qty 1

## 2024-11-25 MED ORDER — HYDROCORTISONE 1 % EX CREA: TOPICAL_CREAM | Freq: Three times a day (TID) | CUTANEOUS | Status: DC | PRN

## 2024-11-25 MED ORDER — ASPIRIN 81 MG PO TBEC
81.0000 mg | DELAYED_RELEASE_TABLET | Freq: Every day | ORAL | Status: DC
  Administered 2024-11-26 – 2024-12-10 (×15): 81 mg via ORAL
  Filled 2024-11-25 (×16): qty 1

## 2024-11-25 MED ORDER — LOPERAMIDE HCL 2 MG PO CAPS: 2.0000 mg | ORAL_CAPSULE | ORAL | Status: DC | PRN

## 2024-11-25 MED ORDER — ACETAMINOPHEN 325 MG PO TABS
325.0000 mg | ORAL_TABLET | ORAL | Status: DC | PRN
  Administered 2024-11-28 – 2024-12-04 (×4): 650 mg via ORAL
  Filled 2024-11-25 (×4): qty 2

## 2024-11-25 MED ORDER — POLYETHYLENE GLYCOL 3350 17 G PO PACK
17.0000 g | PACK | Freq: Every day | ORAL | Status: DC
  Administered 2024-11-25: 17 g via ORAL
  Filled 2024-11-25: qty 1

## 2024-11-25 MED ORDER — OSMOLITE 1.5 CAL PO LIQD: 237.0000 mL | Freq: Three times a day (TID) | ORAL | Status: DC

## 2024-11-25 MED ORDER — METHYLPHENIDATE HCL 5 MG PO TABS
5.0000 mg | ORAL_TABLET | Freq: Every morning | ORAL | Status: DC
  Administered 2024-11-26 – 2024-12-02 (×7): 5 mg via ORAL
  Filled 2024-11-25 (×7): qty 1

## 2024-11-25 MED ORDER — POLYETHYLENE GLYCOL 3350 17 G PO PACK: 17.0000 g | PACK | Freq: Every day | ORAL | Status: DC

## 2024-11-25 MED ORDER — FREE WATER
200.0000 mL | Freq: Four times a day (QID) | Status: DC
  Administered 2024-11-25 – 2024-12-02 (×27): 200 mL

## 2024-11-25 MED ORDER — PANTOPRAZOLE SODIUM 40 MG PO TBEC
40.0000 mg | DELAYED_RELEASE_TABLET | Freq: Every day | ORAL | Status: DC
  Administered 2024-11-25 – 2024-11-27 (×3): 40 mg via ORAL
  Filled 2024-11-25 (×3): qty 1

## 2024-11-25 MED ORDER — BISACODYL 10 MG RE SUPP: 10.0000 mg | Freq: Every day | RECTAL | Status: DC | PRN

## 2024-11-25 MED ORDER — POLYETHYLENE GLYCOL 3350 17 G PO PACK
17.0000 g | PACK | Freq: Every day | ORAL | Status: DC
  Administered 2024-11-26: 17 g via ORAL
  Filled 2024-11-25: qty 1

## 2024-11-25 MED ORDER — SENNOSIDES-DOCUSATE SODIUM 8.6-50 MG PO TABS: 2.0000 | ORAL_TABLET | Freq: Every day | ORAL | Status: DC

## 2024-11-25 MED ORDER — HYDRALAZINE HCL 50 MG PO TABS
50.0000 mg | ORAL_TABLET | Freq: Three times a day (TID) | ORAL | Status: DC
  Administered 2024-11-25 – 2024-12-03 (×21): 50 mg via ORAL
  Filled 2024-11-25 (×23): qty 1

## 2024-11-25 MED ORDER — ZINC SULFATE 220 (50 ZN) MG PO CAPS: 220.0000 mg | ORAL_CAPSULE | Freq: Every day | ORAL | Status: DC

## 2024-11-25 MED ORDER — ALUM & MAG HYDROXIDE-SIMETH 200-200-20 MG/5ML PO SUSP
30.0000 mL | ORAL | Status: DC | PRN
  Administered 2024-11-26 – 2024-11-27 (×2): 30 mL via ORAL
  Filled 2024-11-25 (×2): qty 30

## 2024-11-25 MED ORDER — ZINC SULFATE 220 (50 ZN) MG PO CAPS
220.0000 mg | ORAL_CAPSULE | Freq: Every day | ORAL | Status: AC
  Administered 2024-11-25 – 2024-12-08 (×14): 220 mg via ORAL
  Filled 2024-11-25 (×14): qty 1

## 2024-11-25 MED ORDER — FREE WATER: 200.0000 mL | Freq: Four times a day (QID) | Status: DC

## 2024-11-25 MED ORDER — PROSOURCE TF20 ENFIT COMPATIBL EN LIQD
60.0000 mL | Freq: Every day | ENTERAL | Status: DC
  Administered 2024-11-26 – 2024-11-29 (×4): 60 mL
  Filled 2024-11-25 (×4): qty 60

## 2024-11-25 MED ORDER — ROSUVASTATIN CALCIUM 20 MG PO TABS
40.0000 mg | ORAL_TABLET | Freq: Every day | ORAL | Status: DC
  Administered 2024-11-26 – 2024-12-10 (×15): 40 mg via ORAL
  Filled 2024-11-25 (×16): qty 2

## 2024-11-25 MED ORDER — HEPARIN SODIUM (PORCINE) 5000 UNIT/ML IJ SOLN
5000.0000 [IU] | Freq: Three times a day (TID) | INTRAMUSCULAR | Status: DC
  Administered 2024-11-25 – 2024-11-29 (×11): 5000 [IU] via SUBCUTANEOUS
  Filled 2024-11-25 (×10): qty 1

## 2024-11-25 MED ORDER — TRAMADOL HCL 50 MG PO TABS
50.0000 mg | ORAL_TABLET | Freq: Two times a day (BID) | ORAL | Status: DC | PRN
  Administered 2024-11-26 – 2024-12-03 (×7): 50 mg via ORAL
  Filled 2024-11-25 (×8): qty 1

## 2024-11-25 MED ORDER — METHYLPHENIDATE HCL 5 MG PO TABS: 5.0000 mg | ORAL_TABLET | Freq: Every morning | ORAL | Status: DC

## 2024-11-25 MED ORDER — GABAPENTIN 250 MG/5ML PO SOLN: 100.0000 mg | Freq: Two times a day (BID) | ORAL | Status: DC

## 2024-11-25 MED ORDER — CARVEDILOL 12.5 MG PO TABS
12.5000 mg | ORAL_TABLET | Freq: Two times a day (BID) | ORAL | Status: DC
  Administered 2024-11-25 – 2024-12-04 (×18): 12.5 mg via ORAL
  Filled 2024-11-25 (×18): qty 1

## 2024-11-25 MED ORDER — PROSOURCE TF20 ENFIT COMPATIBL EN LIQD: 60.0000 mL | Freq: Every day | ENTERAL | Status: DC

## 2024-11-25 MED ORDER — OSMOLITE 1.5 CAL PO LIQD
237.0000 mL | Freq: Three times a day (TID) | ORAL | Status: DC
  Administered 2024-11-25 – 2024-11-28 (×7): 237 mL
  Filled 2024-11-25 (×13): qty 237

## 2024-11-25 MED ORDER — ONDANSETRON HCL 4 MG/2ML IJ SOLN
4.0000 mg | Freq: Four times a day (QID) | INTRAMUSCULAR | Status: DC | PRN
  Administered 2024-11-30 (×2): 4 mg via INTRAVENOUS
  Filled 2024-11-25 (×2): qty 2

## 2024-11-25 MED ORDER — FLEET ENEMA RE ENEM: 1.0000 | ENEMA | Freq: Once | RECTAL | Status: DC | PRN

## 2024-11-25 MED ORDER — ONDANSETRON 4 MG PO TBDP
4.0000 mg | ORAL_TABLET | Freq: Four times a day (QID) | ORAL | Status: DC | PRN
  Administered 2024-11-25 – 2024-12-05 (×10): 4 mg via ORAL
  Filled 2024-11-25 (×12): qty 1

## 2024-11-25 MED ORDER — GABAPENTIN 100 MG PO CAPS
100.0000 mg | ORAL_CAPSULE | Freq: Two times a day (BID) | ORAL | Status: DC
  Administered 2024-11-25 – 2024-11-29 (×8): 100 mg via ORAL
  Filled 2024-11-25 (×8): qty 1

## 2024-11-25 MED ORDER — TRAZODONE HCL 50 MG PO TABS: 25.0000 mg | ORAL_TABLET | Freq: Every evening | ORAL | Status: DC | PRN

## 2024-11-25 MED ORDER — BISACODYL 10 MG RE SUPP: 10.0000 mg | Freq: Every day | RECTAL | Status: AC | PRN

## 2024-11-25 MED ORDER — GERHARDT'S BUTT CREAM: 1.0000 | TOPICAL_CREAM | Freq: Two times a day (BID) | CUTANEOUS | Status: DC

## 2024-11-25 MED ORDER — SENNOSIDES-DOCUSATE SODIUM 8.6-50 MG PO TABS
2.0000 | ORAL_TABLET | Freq: Every day | ORAL | Status: DC
  Administered 2024-11-25 – 2024-12-03 (×7): 2 via ORAL
  Filled 2024-11-25 (×9): qty 2

## 2024-11-25 MED ORDER — GUAIFENESIN-DM 100-10 MG/5ML PO SYRP: 5.0000 mL | ORAL_SOLUTION | Freq: Four times a day (QID) | ORAL | Status: DC | PRN

## 2024-11-25 MED ORDER — GERHARDT'S BUTT CREAM
TOPICAL_CREAM | Freq: Two times a day (BID) | CUTANEOUS | Status: DC
  Filled 2024-11-25 (×2): qty 60

## 2024-11-25 MED ORDER — DIPHENHYDRAMINE HCL 25 MG PO CAPS
25.0000 mg | ORAL_CAPSULE | Freq: Four times a day (QID) | ORAL | Status: DC | PRN
  Administered 2024-12-07 – 2024-12-08 (×2): 25 mg via ORAL
  Filled 2024-11-25 (×2): qty 1

## 2024-11-25 MED ORDER — HYDRALAZINE HCL 50 MG PO TABS: 50.0000 mg | ORAL_TABLET | Freq: Three times a day (TID) | ORAL | Status: DC

## 2024-11-25 MED ORDER — PANTOPRAZOLE SODIUM 40 MG PO TBEC: 40.0000 mg | DELAYED_RELEASE_TABLET | Freq: Every day | ORAL | Status: DC

## 2024-11-25 MED ORDER — AMLODIPINE BESYLATE 10 MG PO TABS
10.0000 mg | ORAL_TABLET | Freq: Every day | ORAL | Status: DC
  Administered 2024-11-26 – 2024-12-10 (×15): 10 mg via ORAL
  Filled 2024-11-25 (×16): qty 1

## 2024-11-25 MED ORDER — ORAL CARE MOUTH RINSE
15.0000 mL | OROMUCOSAL | Status: DC
  Administered 2024-11-25 – 2024-12-10 (×58): 15 mL via OROMUCOSAL

## 2024-11-25 NOTE — Plan of Care (Signed)
 " Problem: Education: Goal: Knowledge of General Education information will improve Description: Including pain rating scale, medication(s)/side effects and non-pharmacologic comfort measures Outcome: Progressing   Problem: Health Behavior/Discharge Planning: Goal: Ability to manage health-related needs will improve Outcome: Progressing   Problem: Clinical Measurements: Goal: Ability to maintain clinical measurements within normal limits will improve Outcome: Progressing Goal: Will remain free from infection Outcome: Progressing Goal: Diagnostic test results will improve Outcome: Progressing Goal: Respiratory complications will improve Outcome: Progressing Goal: Cardiovascular complication will be avoided Outcome: Progressing   Problem: Activity: Goal: Risk for activity intolerance will decrease Outcome: Progressing   Problem: Nutrition: Goal: Adequate nutrition will be maintained Outcome: Progressing   Problem: Coping: Goal: Level of anxiety will decrease Outcome: Progressing   Problem: Elimination: Goal: Will not experience complications related to bowel motility Outcome: Progressing Goal: Will not experience complications related to urinary retention Outcome: Progressing   Problem: Pain Managment: Goal: General experience of comfort will improve and/or be controlled Outcome: Progressing   Problem: Safety: Goal: Ability to remain free from injury will improve Outcome: Progressing   Problem: Skin Integrity: Goal: Risk for impaired skin integrity will decrease Outcome: Progressing   Problem: Activity: Goal: Ability to tolerate increased activity will improve Outcome: Progressing   Problem: Respiratory: Goal: Ability to maintain a clear airway and adequate ventilation will improve Outcome: Progressing   Problem: Role Relationship: Goal: Method of communication will improve Outcome: Progressing   Problem: Education: Goal: Knowledge of disease or condition  will improve Outcome: Progressing Goal: Knowledge of secondary prevention will improve (MUST DOCUMENT ALL) Outcome: Progressing Goal: Knowledge of patient specific risk factors will improve (DELETE if not current risk factor) Outcome: Progressing   Problem: Ischemic Stroke/TIA Tissue Perfusion: Goal: Complications of ischemic stroke/TIA will be minimized Outcome: Progressing   Problem: Coping: Goal: Will verbalize positive feelings about self Outcome: Progressing Goal: Will identify appropriate support needs Outcome: Progressing   Problem: Health Behavior/Discharge Planning: Goal: Ability to manage health-related needs will improve Outcome: Progressing Goal: Goals will be collaboratively established with patient/family Outcome: Progressing   Problem: Self-Care: Goal: Ability to participate in self-care as condition permits will improve Outcome: Progressing Goal: Verbalization of feelings and concerns over difficulty with self-care will improve Outcome: Progressing Goal: Ability to communicate needs accurately will improve Outcome: Progressing   Problem: Nutrition: Goal: Risk of aspiration will decrease Outcome: Progressing Goal: Dietary intake will improve Outcome: Progressing   Problem: Education: Goal: Knowledge of disease or condition will improve Outcome: Progressing Goal: Knowledge of secondary prevention will improve (MUST DOCUMENT ALL) Outcome: Progressing Goal: Knowledge of patient specific risk factors will improve (DELETE if not current risk factor) Outcome: Progressing   Problem: Ischemic Stroke/TIA Tissue Perfusion: Goal: Complications of ischemic stroke/TIA will be minimized Outcome: Progressing   Problem: Coping: Goal: Will verbalize positive feelings about self Outcome: Progressing Goal: Will identify appropriate support needs Outcome: Progressing   Problem: Health Behavior/Discharge Planning: Goal: Ability to manage health-related needs will  improve Outcome: Progressing Goal: Goals will be collaboratively established with patient/family Outcome: Progressing   Problem: Self-Care: Goal: Ability to participate in self-care as condition permits will improve Outcome: Progressing Goal: Verbalization of feelings and concerns over difficulty with self-care will improve Outcome: Progressing Goal: Ability to communicate needs accurately will improve Outcome: Progressing   Problem: Nutrition: Goal: Risk of aspiration will decrease Outcome: Progressing Goal: Dietary intake will improve Outcome: Progressing   Problem: Education: Goal: Knowledge of disease or condition will improve Outcome: Progressing Goal: Knowledge of secondary prevention will  improve (MUST DOCUMENT ALL) Outcome: Progressing Goal: Knowledge of patient specific risk factors will improve (DELETE if not current risk factor) Outcome: Progressing   "

## 2024-11-25 NOTE — H&P (Signed)
 Physical Medicine and Rehabilitation Admission H&P     CC: Functional deficits due to debility       HPI: Erin [name] is a 69 year old female with PMHx of Anxiety, Aortoiliac occlusive disease Arthritis, BPPV, Cardiomyopathy, CAD, Depression, HTN, HLD, T2DM, Hypothyroidism, PAD, Anal cancer-s/p chemo/XRT 2014, s/p mesenteric artery stenting (10/14). The patient presented to [facility] on 10/23/2024 for planned robotic hernia repair by Dr. Redmond. Postoperatively she developed hypotension and acute blood loss anemia. CCM consulted for hemorrhagic shock in the setting of hemoperitoneum and requiring intubation and press and transfer to ICU. On 12/4 she returned back to the OR where no active bleeding was identified but the abdomen was packed and wound VAC placed. CT angio abdomen/pelvis: Large right intraperitoneal hematoma with active bleeding from the right epigastric artery, left renal artery occluded at the origin with minimal enhancement. Severe narrowing of the SMA immediately distal to the stent which is patent.  On 12/5 she returned to OR where there was no evidence of bleeding and abdomen was closed.    She was transitioned off of sedation but remained encephalopathic. On 12/8 Neurology consulted and patient underwent STAT CT head that revealed interval development of multiple cortical and subcortical edema areas involving bilateral cerebral and cerebellar hemispheres. MRI showed evidence of bilateral strokes with scattered areas of restricted diffusion throughout the cerebral hemispheres and cerebellum concerning for acute infarcts of embolic etiology. An EEG showed continuous slow, generalized. The study is suggestive of generalized cerebral dysfunction ( encephalopathy). No seizures or epileptiform discharges were seen throughout the recording.  Etiology likely profound intracranial hypoperfusion although some initial concern for embolic due to DIC, ultimately ruled out.  Neuro  recommended aspirin  81 mg daily. EF 60-65%, carotid doppler without significant stenosis. She was transferred to [facility] ICU on 10/29/24, stabilized in the ICU and extubated on 12/11 an subsequently transferred to [facility] on 12/15. Hospital course complicated by AKI on CKD, Enterobacter cloacae HCAP, dysphagia and malnutrition requiring PEC placement on 12/30, anemia, poorly controlled pain, and neurogenic bowel/bladder.    Per chart review, patient lives in a private home with 24/7 assistance from her spouse who is physically capable of assisting her (recently cared for their parents during hospice transition), single level with one-step to enter with grab bars near the tub/shower and toilet. Prior to admission, she was independent of mobility with no assistive device other community ambulation was limited at times. She was independent of ADLs and IADLs, but got assistance from her spouse from grocery shopping. Currently, she is min assist for bed mobility, min assist for sit to stand transfers and min assist +2 for gait up to 15 feet requiring frequent cueing and limited by fatigue. MBS 12-22 showed mild oropharyngeal dysphagia, she was advanced to a regular diet on 12-31 with transition to nocturnal tube feeds to support nutrition; she is eating 25 to 100% of meals.      Review of Systems  Constitutional:  Positive for malaise/fatigue and weight loss.  HENT: Negative.    Eyes: Negative.   Respiratory:  Positive for shortness of breath.   Cardiovascular: Negative.   Gastrointestinal:  Positive for nausea.  Genitourinary:        Has purewick  Musculoskeletal: Negative.   Skin: Negative.   Neurological:  Positive for weakness.  Psychiatric/Behavioral:  The patient is nervous/anxious.                  Past Medical History:  Diagnosis  Date   Allergy     Alopecia     Anal cancer (HCC) 08/14/2013    invasive squamous cell ca, s/p radiation 10/20-11/26/14 60.4Gy/61fx and chemo    Anxiety     Aortoiliac occlusive disease (HCC) 08/14/2017   Arthritis     B12 deficiency anemia 09/14/2015   Blood transfusion without reported diagnosis     BPPV (benign paroxysmal positional vertigo) 07/08/2015   Cardiomyopathy (HCC) 11/03/2017   Chronic back pain     Chronic daily headache 03/29/2013    takes bc powder   Closed right hip fracture (HCC) 09/10/2015   Coronary artery disease involving native coronary artery of native heart without angina pectoris 10/13/2017    DES to mid RCA   Depression     Essential (hemorrhagic) thrombocythemia (HCC) 09/06/2018   Essential hypertension 09/25/2023   Family history of early CAD 04/08/2016   GERD (gastroesophageal reflux disease)     History of hiatal hernia     Hot flashes     Hyperlipidemia     Hyperlipidemia associated with type 2 diabetes mellitus (HCC) 05/11/2017   Hypothyroidism 09/10/2015   IBS (irritable bowel syndrome) 03/29/2013   Neck pain 07/08/2015   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    takes neurotin   Peripheral vascular disease 11/03/2017   QT prolongation     Sacroiliitis 09/06/2018   Spasms of the hands or feet 10/08/2017   Syncope 06/07/2016   Tubular adenoma of colon 09/08/2003   Vertigo     Wears glasses               Past Surgical History:  Procedure Laterality Date   ABDOMINAL AORTOGRAM N/A 08/02/2017    Procedure: ABDOMINAL AORTOGRAM;  Surgeon: Darron Deatrice LABOR, MD;  Location: [facility];  Service: Cardiovascular;  Laterality: N/A;   ABDOMINAL AORTOGRAM W/LOWER EXTREMITY Bilateral 09/03/2024    Procedure: ABDOMINAL AORTOGRAM W/LOWER EXTREMITY;  Surgeon: Serene Gaile ORN, MD;  Location: [facility];  Service: Cardiovascular;  Laterality: Bilateral;   AORTA - BILATERAL FEMORAL ARTERY BYPASS GRAFT N/A 08/14/2017    Procedure: AORTA BIFEMORAL BYPASS GRAFT;  Surgeon: Oris Krystal FALCON, MD;  Location: [facility] OR;  Service: Vascular;  Laterality: N/A;   COLONOSCOPY       COLONOSCOPY N/A 08/29/2024     Procedure: COLONOSCOPY;  Surgeon: Wilhelmenia Aloha Raddle., MD;  Location: [facility];  Service: Gastroenterology;  Laterality: N/A;   CORONARY STENT INTERVENTION N/A 10/13/2017    Procedure: CORONARY STENT INTERVENTION;  Surgeon: Mady Bruckner, MD;  Location: [facility];  Service: Cardiovascular;  Laterality: N/A;   dental implant       ECTOPIC PREGNANCY SURGERY       EMBOLECTOMY N/A 08/14/2017    Procedure: EMBOLECTOMY FEMORAL;  Surgeon: Oris Krystal FALCON, MD;  Location: [facility] OR;  Service: Vascular;  Laterality: N/A;   FEMORAL-POPLITEAL BYPASS GRAFT Right 08/14/2017    Procedure: Right Femoral to Above Knee Popliteal Bypass Graft using Non-Reversed Greater Saphenous Vein Graft from Right Leg;  Surgeon: Oris Krystal FALCON, MD;  Location: [facility] OR;  Service: Vascular;  Laterality: Right;   FLEXIBLE SIGMOIDOSCOPY N/A 08/14/2013    Procedure: FLEXIBLE SIGMOIDOSCOPY;  Surgeon: Gwendlyn ONEIDA Buddy, MD;  Location: [facility];  Service: Endoscopy;  Laterality: N/A;   IR GASTROSTOMY TUBE MOD SED   11/19/2024   LAPAROTOMY N/A 10/24/2024    Procedure: EXPLORATORY LAPAROTOMY, ABDOMINAL PACKING AND WOUND VAC APPLICATION;  Surgeon: Debby Hila, MD;  Location: [facility] OR[facility];  Service: General;  Laterality: N/A;  (8) EIGHT 18X18 LAP SPONGE PACKED   LAPAROTOMY N/A 10/25/2024    Procedure: LAPAROTOMY, EXPLORATORY;  Surgeon: Rubin Calamity, MD;  Location: [facility];  Service: General;  Laterality: N/A;  REMOVAL OF PACKING   LEFT HEART CATH AND CORONARY ANGIOGRAPHY N/A 10/13/2017    Procedure: LEFT HEART CATH AND CORONARY ANGIOGRAPHY;  Surgeon: Mady Bruckner, MD;  Location: [facility];  Service: Cardiovascular;  Laterality: N/A;   LOWER EXTREMITY ANGIOGRAPHY Bilateral 08/02/2017    Procedure: Lower Extremity Angiography;  Surgeon: Darron Deatrice LABOR, MD;  Location: [facility] INVASIVE CV LAB;  Service: Cardiovascular;  Laterality: Bilateral;   LOWER EXTREMITY INTERVENTION N/A 09/03/2024    Procedure: LOWER EXTREMITY  INTERVENTION;  Surgeon: Serene Gaile ORN, MD;  [facility]: [facility];  Service: Cardiovascular;  Laterality: N/A;   MULTIPLE TOOTH EXTRACTIONS       PERIPHERAL VASCULAR INTERVENTION   05/22/2018    Procedure: PERIPHERAL VASCULAR INTERVENTION;  Surgeon: Serene Gaile ORN, MD;  Location: [facility];  Service: Cardiovascular;;  SMA and Celiac   PILONIDAL CYST EXCISION       POLYPECTOMY       THROMBECTOMY FEMORAL ARTERY Right 08/14/2017    Procedure: THROMBECTOMY FEMORAL ARTERY;  Surgeon: Oris Krystal FALCON, MD;  Location: [facility] OR;  Service: Vascular;  Laterality: Right;   TONSILLECTOMY       TOTAL HIP ARTHROPLASTY   09/11/2015    Procedure: TOTAL HIP ARTHROPLASTY;  Surgeon: Evalene JONETTA Chancy, MD;  Location: [facility] OR;  Service: Orthopedics;;   ULTRASOUND GUIDANCE FOR VASCULAR ACCESS   10/13/2017    Procedure: Ultrasound Guidance For Vascular Access;  Surgeon: Mady Bruckner, MD;  Location: [facility];  Service: Cardiovascular;;   VENTRAL HERNIA REPAIR N/A 10/23/2024    Procedure: REPAIR, HERNIA, VENTRAL, LAPAROSCOPIC;  Surgeon: Rubin Calamity, MD;  Location: [facility];  Service: General;  Laterality: N/A;   VISCERAL ANGIOGRAPHY N/A 03/03/2020    Procedure: MESTENRIC ANGIOGRAPHY;  Surgeon: Serene Gaile ORN, MD;  Location: [facility];  Service: Cardiovascular;  Laterality: N/A;   VISCERAL ANGIOGRAPHY N/A 09/03/2024    Procedure: VISCERAL ANGIOGRAPHY;  Surgeon: Serene Gaile ORN, MD;  Location: [facility];  Service: Cardiovascular;  Laterality: N/A;   VISCERAL ARTERY INTERVENTION N/A 09/03/2024    Procedure: VISCERAL ARTERY INTERVENTION;  Surgeon: Serene Gaile ORN, MD;  Location: [facility];  Service: Cardiovascular;  Laterality: N/A;             Family History  Problem Relation Age of Onset   Dementia Mother     Arthritis Mother     Hyperlipidemia Mother     Heart disease Mother     Hypertension Mother     Stroke Mother 49   Irritable bowel syndrome Mother      Thyroid  disease Mother     Heart attack Mother     Heart disease Father     Hyperlipidemia Father     Hypertension Father     Stroke Father 31   Thyroid  disease Father     Prostate cancer Father     Heart attack Father     Lung cancer Brother 34   Heart attack Maternal Uncle     Heart attack Maternal Grandfather     Colon cancer Neg Hx     Rectal cancer Neg Hx     Stomach cancer Neg Hx     Esophageal cancer Neg Hx  Social History:  reports that she quit smoking about 10 years ago. Her smoking use included cigarettes. She smoked an average of 0.5 packs per day. She has never used smokeless tobacco. She reports that she does not drink alcohol and does not use drugs. Allergies: [Allergies]  [Allergies]      Allergen Reactions   Latex Itching      Must use paper tape   Amitiza  [Lubiprostone ] Diarrhea      Uncontrollable diarrhea   Nortriptyline  Other (See Comments)      Stomach distention   Sulfa Antibiotics Nausea And Vomiting and Other (See Comments)      GI distress/pain   Tramadol  Itching and Rash   Atorvastatin  Nausea And Vomiting   Claritin [Loratadine] Other (See Comments)      Hot flashes         Medications Prior to Admission  Medication Sig Dispense Refill   acetaminophen  (TYLENOL ) 500 MG tablet Take 1,000 mg by mouth every 6 (six) hours as needed for headache (pain).       acyclovir  ointment (ZOVIRAX ) 5 % Apply 1 Application topically every 3 (three) hours as needed (fever blisters). 15 g 3   amLODipine  (NORVASC ) 5 MG tablet Take 10 mg by mouth daily.       aspirin  EC 81 MG tablet Take 1 tablet (81 mg total) by mouth daily. Swallow whole.       carvedilol  (COREG ) 12.5 MG tablet Take 12.5 mg by mouth 2 (two) times daily.       Cholecalciferol (VITAMIN D ) 2000 units tablet Take 2,000 Units by mouth daily.       clopidogrel  (PLAVIX ) 75 MG tablet Take 1 tablet (75 mg total) by mouth daily. 30 tablet 11   DULoxetine  (CYMBALTA ) 20 MG capsule TAKE 2-3  CAPSULES BY MOUTH EVERY DAY 270 capsule 1   fluticasone  (FLONASE ) 50 MCG/ACT nasal spray SPRAY 1 SPRAY INTO EACH NOSTRIL DAILY 96 mL 1   gabapentin  (NEURONTIN ) 600 MG tablet TAKE 1 TABLET BY MOUTH THREE TIMES A DAY 90 tablet 3   ketoconazole  (NIZORAL ) 2 % shampoo Apply to hair daily for 7 days -- leave on for 5 minutes, then wash every other day for 7 days, then wash twice a week for 7 days,  then once a week. 120 mL 5   levothyroxine  (SYNTHROID ) 75 MCG tablet TAKE 1 TABLET BY MOUTH DAILY BEFORE BREAKFAST MONDAY-FRIDAY, THEN 1/2 SATURDAY AND SUNDAY 84 tablet 0   metoCLOPramide  (REGLAN ) 5 MG tablet TAKE 1 TABLET BY MOUTH EVERY 6 HOURS AS NEEDED FOR NAUSEA. 60 tablet 0   nitroGLYCERIN  (NITROSTAT ) 0.4 MG SL tablet Place 1 tablet (0.4 mg total) under the tongue every 5 (five) minutes as needed for chest pain. 25 tablet 3   nystatin -triamcinolone  (MYCOLOG II) cream Apply 1 application topically 2 (two) times daily as needed (dry skin).    2   pantoprazole  (PROTONIX ) 40 MG tablet TAKE 1 TABLET BY MOUTH TWICE A DAY 180 tablet 3   potassium chloride  SA (KLOR-CON  M20) 20 MEQ tablet Take 1 tablet (20 mEq total) by mouth 2 (two) times daily. 180 tablet 3   rOPINIRole  (REQUIP ) 1 MG tablet Take 1 tablet (1 mg total) by mouth at bedtime. May Increase to 2 tablet at bedtime after 7 days if needed. 180 tablet 1   rosuvastatin  (CRESTOR ) 40 MG tablet Take 1 tablet (40 mg total) by mouth daily. 90 tablet 1   tiZANidine (ZANAFLEX) 4 MG tablet Take 2 mg by mouth  3 (three) times daily.       topiramate  (TOPAMAX ) 50 MG tablet Take 50 mg by mouth 2 (two) times daily.       vitamin B-12 1000 MCG tablet Take 1 tablet (1,000 mcg total) by mouth daily. 30 tablet 0   hydrALAZINE  (APRESOLINE ) 25 MG tablet Take 1 tab once daily as needed for elevated BP (>160/95)       meclizine  (ANTIVERT ) 25 MG tablet TAKE 1 TABLET BY MOUTH THREE TIMES A DAY AS NEEDED FOR DIZZINESS 90 tablet 1              Home: Home  Living Family/patient expects to be discharged to:: Private residence Living Arrangements: Spouse/significant other Available Help at Discharge: Family, Available 24 hours/day Type of Home: House Home Access: Stairs to enter Secretary/administrator of Steps: 1 Home Layout: One level Bathroom Shower/Tub: Health Visitor: Handicapped height Bathroom Accessibility: Yes Home Equipment: Grab bars - tub/shower, Grab bars - toilet, Hand held shower head, Shower seat - built in, BSC/3in1, Agricultural [employer]ant (2 wheels), [employer] - single point, Wheelchair - manual Additional Comments: 1 cat and 1 dog at home  Lives With: Spouse   Functional History: Prior Function Prior Level of Function : Independent/Modified Independent, Driving Mobility Comments: no AD, tolerance for community mobility limited as times per spouse ADLs Comments: retired, ind ADLs, IADLS, meds; spouse completes grocery shopping   Functional Status:  Mobility: Bed Mobility Overal bed mobility: Needs Assistance Bed Mobility: Supine to Sit, Sit to Supine Rolling: Modified independent (Device/Increase time) Sidelying to sit: Max assist, Used rails Supine to sit: Contact guard, Used rails Sit to supine: Contact guard assist Sit to sidelying: Mod assist General bed mobility comments: cues for sequencing and technique. increased time and CGA for safety Transfers Overall transfer level: Needs assistance Equipment used: Rolling walker (2 wheels) Transfers: Sit to/from Stand Sit to Stand: Min assist Bed to/from chair/wheelchair/BSC transfer type:: Step pivot Squat pivot transfers: Max assist Step pivot transfers: Min assist, +2 physical assistance Transfer via Lift Equipment: Stedy General transfer comment: From EOB x2 with min A for rise and cues for hand placement Ambulation/Gait Ambulation/Gait assistance: Min assist, +2 safety/equipment Gait Distance (Feet): 50 Feet (+75) Assistive device: Rolling walker (2  wheels) Gait Pattern/deviations: Narrow base of support, Trunk flexed, Step-to pattern, Decreased stride length, Ataxic, Step-through pattern General Gait Details: Pt initially demonstrating slow steps with cues for sequencing progressing to improved fluidity and step-through pattern. Min A for stability and intermittent RW management. Ataxia noted with decreased awareness of foot placement and narrow BOS Gait velocity interpretation: <1.31 ft/sec, indicative of household ambulator   ADL: ADL Overall ADL's : Needs assistance/impaired Eating/Feeding: Set up, Sitting Grooming: Brushing hair, Sitting, Contact guard assist, Wash/dry face Grooming Details (indicate cue type and reason): min-mod A for sitting balance while pt brushing hair; needed ~1 minute to initiate Upper Body Bathing: Contact guard assist, Sitting Lower Body Bathing: Moderate assistance, Sit to/from stand Upper Body Dressing : Contact guard assist, Sitting Lower Body Dressing: Moderate assistance, Sit to/from stand Toilet Transfer: Minimal assistance, Ambulation, Regular Toilet, Rolling walker (2 wheels) Functional mobility during ADLs: Total assistance, +2 for physical assistance General ADL Comments: total care   Cognition: Cognition Overall Cognitive Status: Impaired/Different from baseline Arousal/Alertness: Awake/alert Orientation Level: Oriented to person, Oriented to place, Disoriented to time, Disoriented to situation Attention: Focused Focused Attention: Impaired Focused Attention Impairment: Functional basic Cognition Arousal: Alert Behavior During Therapy: [facility] for tasks assessed/performed Overall Cognitive Status:  Impaired/Different from baseline   Physical Exam: Blood pressure 138/65, pulse 80, temperature 98.2 F (36.8 C), temperature source Oral, resp. rate 14, height 5' 3 (1.6 m), weight 43.3 kg, SpO2 98%. Physical Exam Constitutional:      General: She is not in acute distress.    Appearance: She  is ill-appearing.  HENT:     Right Ear: External ear normal.     Left Ear: External ear normal.     Nose: Nose normal.     Mouth/Throat:     Pharynx: Oropharynx is clear.  Eyes:     Conjunctiva/sclera: Conjunctivae normal.  Cardiovascular:     Rate and Rhythm: Normal rate and regular rhythm.     Heart sounds: No murmur heard.    No gallop.  Pulmonary:     Effort: Pulmonary effort is normal. No respiratory distress.     Breath sounds: No wheezing.  Abdominal:     Palpations: Abdomen is soft.     Tenderness: There is abdominal tenderness.     Comments: PEG site clean and intact, sl tender to palpation  Musculoskeletal:     Cervical back: Normal range of motion.     Comments: Mild posterior neck discomfort with palpation. As functional ROM of neck  Skin:    General: Skin is warm.  Neurological:     Comments: Alert and oriented to person, place, month. Needed help with year, and could not remember day. Was able to recall her address. Fair insight and awareness. Intact Memory. Normal language and speech. Cranial nerve exam unremarkable. MMT: BUE 4/5 prox to distal. BLE 3/5 HF, 4/5 KE and ADF/PF. Sensory exam normal for light touch and pain in all 4 limbs. No limb ataxia or cerebellar signs. No abnormal tone appreciated.  SABRA    Psychiatric:        Mood and Affect: Mood normal.        Behavior: Behavior normal.       Lab Results Last 48 Hours        Results for orders placed or performed during the hospital encounter of 10/23/24 (from the past 48 hours)  Glucose, capillary     Status: Abnormal    Collection Time: 11/23/24 11:29 AM  Result Value Ref Range    Glucose-Capillary 141 (H) 70 - 99 mg/dL      Comment: Glucose reference range applies only to samples taken after fasting for at least 8 hours.  Glucose, capillary     Status: Abnormal    Collection Time: 11/23/24  3:22 PM  Result Value Ref Range    Glucose-Capillary 129 (H) 70 - 99 mg/dL      Comment: Glucose reference range  applies only to samples taken after fasting for at least 8 hours.  Glucose, capillary     Status: Abnormal    Collection Time: 11/23/24  7:58 PM  Result Value Ref Range    Glucose-Capillary 103 (H) 70 - 99 mg/dL      Comment: Glucose reference range applies only to samples taken after fasting for at least 8 hours.  Glucose, capillary     Status: Abnormal    Collection Time: 11/23/24 11:24 PM  Result Value Ref Range    Glucose-Capillary 142 (H) 70 - 99 mg/dL      Comment: Glucose reference range applies only to samples taken after fasting for at least 8 hours.  Glucose, capillary     Status: None    Collection Time: 11/24/24  4:29 AM  Result Value Ref Range    Glucose-Capillary 95 70 - 99 mg/dL      Comment: Glucose reference range applies only to samples taken after fasting for at least 8 hours.  Glucose, capillary     Status: Abnormal    Collection Time: 11/24/24  7:48 AM  Result Value Ref Range    Glucose-Capillary 101 (H) 70 - 99 mg/dL      Comment: Glucose reference range applies only to samples taken after fasting for at least 8 hours.  Glucose, capillary     Status: None    Collection Time: 11/24/24 11:46 AM  Result Value Ref Range    Glucose-Capillary 92 70 - 99 mg/dL      Comment: Glucose reference range applies only to samples taken after fasting for at least 8 hours.  Glucose, capillary     Status: Abnormal    Collection Time: 11/24/24  3:43 PM  Result Value Ref Range    Glucose-Capillary 136 (H) 70 - 99 mg/dL      Comment: Glucose reference range applies only to samples taken after fasting for at least 8 hours.  Glucose, capillary     Status: Abnormal    Collection Time: 11/24/24  7:44 PM  Result Value Ref Range    Glucose-Capillary 106 (H) 70 - 99 mg/dL      Comment: Glucose reference range applies only to samples taken after fasting for at least 8 hours.  Glucose, capillary     Status: Abnormal    Collection Time: 11/25/24 12:42 AM  Result Value Ref Range     Glucose-Capillary 113 (H) 70 - 99 mg/dL      Comment: Glucose reference range applies only to samples taken after fasting for at least 8 hours.  Glucose, capillary     Status: Abnormal    Collection Time: 11/25/24  7:34 AM  Result Value Ref Range    Glucose-Capillary 111 (H) 70 - 99 mg/dL      Comment: Glucose reference range applies only to samples taken after fasting for at least 8 hours.    *Note: Due to a large number of results and/or encounters for the requested time period, some results have not been displayed. A complete set of results can be found in Results Review.      Imaging Results (Last 48 hours)  No results found.         Blood pressure 138/65, pulse 80, temperature 98.2 F (36.8 C), temperature source Oral, resp. rate 14, height 5' 3 (1.6 m), weight 43.3 kg, SpO2 98%.   Medical Problem List and Plan: 1. Functional deficits secondary to embolic bilateral cerebral infarcts and debility after prolonged hospital stay             -patient may  shower             -ELOS/Goals: 7-10 days, supervision to mod I goals   2.  Antithrombotics: -DVT/anticoagulation:  Mechanical: Sequential compression devices, below knee Bilateral lower extremities Pharmaceutical: sq Heparin  5000 q8             -antiplatelet therapy: Asprin 81 mg daily    3. Pain Management: history of peripheral neuropathy Gabapentin  cut back to 100 mg q12 d/t encephalopathy.   PRN tylenol  and tramadol .    4. Mood/Behavior/Sleep: LCSW to follow for evaluation and support when available.              -antipsychotic agents: N/A             -  Continue Ritalin  5 mg a.m. to encourage sleep wake cycle.     5. Neuropsych/cognition: This patient is capable of making decisions on her own behalf.   6. Skin/Wound Care: Routine pressure relief measures.    7. Fluids/Electrolytes/Nutrition/dysphagia: Monitor I&O and weight. Follow up labs CBC/CMP               Oropharyngeal dysphagia: Dysphagia: Secondary to  CVA/anoxic injury.  -PEG placed on 12/30-- -Pt to receive a can of osmolite if eats less than 50% of meal -continue protein supp and free water  -consider appetite stimulant   8. Postoperative hemorrhagic shock with acute blood loss anemia following incisional hernia repair reexploration 12/4 and 12/5:  -Gen Surgery continuing to follow as needed.    9. Acute bilateral cerebellar/cerebral infarct secondary to profound intracranial hypoperfusion due to hemorrhagic shock/hypotension:  -Continue Asprin. Follow up with [facility] outpatient.    10. Acute metabolic encephalopathy: seems much improved, continue to monitor-delirium precautions.    11. ABLA: secondary to hemorrhagic shock/critical illness. Hemoglobin 8.8 (12/31). Monitor CBC.    12. AKI on CKD stage IIIb: continue to monitor renal function.  -Right renal infarct: incidental finding on CT imaging on 12/4-likely due to hypoperfusion in setting of hypotension. Continue supportive care.    14. HCAP (Enterobacter cloacae on tracheal aspirate culture): Completed antibiotic course.    15. Hypertension: monitor BP per protocol on Amlodipine  10 mg, Coreg  12.5 mg, and Hydralazine  50 mg.    16. Hypothyrodism: continue Synthroid .    17. 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 Serene)             -on Asprin          Erin LOISE Satterfield, NP 11/25/2024  I have personally performed a face to face diagnostic evaluation of this patient and formulated the key components of the plan.  Additionally, I have personally reviewed laboratory data, imaging studies, as well as relevant notes and concur with the physician assistant's documentation above.  The patient's status has not changed from the original H&P.  Any changes in documentation from the acute care chart have been noted above.  Arthea IVAR Gunther, MD, LEELLEN

## 2024-11-25 NOTE — TOC Transition Note (Signed)
 Transition of Care [facility]) - Discharge Note   Patient Details  Name: Erin [name] MRN: [id number] Date of Birth: May 22, 1956  [facility] CM/SW Contact:  Landry DELENA Senters, RN Phone Number: 11/25/2024, 11:25 AM   Clinical Narrative:     Patient will be discharging to CIR today.  No further needs identified by CM.  Final next level of care: IP Rehab Facility Barriers to Discharge: No Barriers Identified   Patient Goals and CMS Choice Patient states their goals for this hospitalization and ongoing recovery are:: Rehab CMS Medicare.gov Compare Post Acute Care list provided to:: Patient Choice offered to / list presented to : Patient [place] ownership interest in [facility].provided to:: Spouse    Discharge Placement                       Discharge Plan and Services Additional resources added to the After Visit Summary for   In-house Referral: Clinical Social Work Discharge Planning Services: CM Consult Post Acute Care Choice: Skilled Nursing Facility          DME Arranged: N/A DME Agency: NA       HH Arranged: NA HH Agency: NA        Social Drivers of Health (SDOH) Interventions SDOH Screenings   Food Insecurity: No Food Insecurity (10/24/2024)  Housing: Low Risk (10/24/2024)  Transportation Needs: No Transportation Needs (10/24/2024)  Utilities: Not At Risk (10/24/2024)  Alcohol Screen: Low Risk (02/26/2024)  Depression (PHQ2-9): Low Risk (02/26/2024)  Financial Resource Strain: Low Risk (02/26/2024)  Physical Activity: Inactive (02/26/2024)  Social Connections: Socially Integrated (10/24/2024)  Stress: No Stress Concern Present (02/26/2024)  Tobacco Use: Medium Risk (10/23/2024)  Health Literacy: Adequate Health Literacy (02/26/2024)     Readmission Risk Interventions    11/22/2024    9:22 AM  Readmission Risk Prevention Plan  Transportation Screening Complete  Medication Review (RN Care Manager) Complete  PCP or Specialist appointment  within 3-5 days of discharge Complete  HRI or Home Care Consult Complete  SW Recovery Care/Counseling Consult Complete  Palliative Care Screening Not Applicable  Skilled Nursing Facility Complete

## 2024-11-25 NOTE — Progress Notes (Signed)
 Inpatient Rehabilitation Admission Medication Review by a Pharmacist  A complete drug regimen review was completed for this patient to identify any potential clinically significant medication issues.  High Risk Drug Classes Is patient taking? Indication by Medication  Antipsychotic No   Anticoagulant Yes Heparin  SQ - VTE prophylaxis  Antibiotic No   Opioid Yes Tramadol  - pain  Antiplatelet Yes ASA - CVA, CAD, PAD  Hypoglycemics/insulin  No   Vasoactive Medication Yes Amlodipine  - HTN Carvedilol  - HTN Hydralazine  - HTN  Chemotherapy No   Other Yes APAP - pain Bisacodyl  - bowel regimen Diphenhydramine  - prn itching Osmolite - feeding supplement Free water  - hyperNa, feeding supplement Gabapentin  - peripheral neuropathy Gerhardt's butt cream - pericare Guaifenesin  DM - prn cough Hydrocortisone  cream - prn itching Loperimide - prn diarrhea Maalox - prn indigestion Ritalin  - alertness / sleep wake cycle Synthroid  - hypothyroidism Ondansetron  - nausea Pantoprazole  - GERD Miralax  - bowel regimen Rosuvastatin  - HLD Senna S - bowel regimen Fleets - bowel regimen Trazodone  - prn sleep Zinc  - wound care supplement     Type of Medication Issue Identified Description of Issue Recommendation(s)  Drug Interaction(s) (clinically significant)     Duplicate Therapy     Allergy     No Medication Administration End Date     Incorrect Dose     Additional Drug Therapy Needed     Significant med changes from prior encounter (inform family/care partners about these prior to discharge). PTA meds not resumed: Flonase  NS, Meclizine , Reglan , Vitamin D    PTA meds DISCONTINUED following inpatient admission: Clopidogrel , Duloxetine , KCl, Ropinirole , Tizanidine, Topiramate  Resume as appropriate during rehab / on discharge.  These medications were added to the Med Rec so their discontinuation could be communicated on discharge.  Other       Clinically significant medication issues were  identified that warrant physician communication and completion of prescribed/recommended actions by midnight of the next day:  No  Name of provider notified for urgent issues identified:   Provider Method of Notification:     Pharmacist comments:   Time spent performing this drug regimen review (minutes):  30   Indiyah Paone, Pharm.D., BCPS Clinical Pharmacist Clinical phone for 11/25/2024 from 7:30-3:00 is [phone].  **Pharmacist phone directory can be found on [email or web address] listed under [facility].  11/25/2024 3:28 PM

## 2024-11-25 NOTE — Progress Notes (Addendum)
 PMR Admission Coordinator Pre-Admission Assessment   Patient: Erin [name] is an 69 y.o., female MRN: [id number] DOB: 16-Oct-1956 Height: 5' 3 (160 cm) Weight: 43.3 kg   Insurance Information HMO: [id number]    PPO:      PCP:      IPA:      80/20:      OTHER: Group [id number] PRIMARY: U[employer] Medicare      Policy#: [id number]      Subscriber: self CM Name: UM dept Phone#:  [phone]    Fax#: [phone] Pre-Cert#: [id number] I recieved auth for CIR from Angela  with [facility] for admit 1/51/26 through 12/02/24.  Updates due to UM dept 1/12 at fax listed above.      Employer: Not employed Benefits:  Phone #: [phone]     Name: Verified on line [email or web address] 11/22/24 Eff. Date: 11/21/24     Deduct: $0      Out of Pocket Max: $4200 (met $0)      Life Max: n/a CIR: $395 for days 1-6      SNF: $0 for days 1-20; $218 for days 21-100 Outpatient:      Co-Pay: 420/visit Home Health: 100%      Co-Pay: none DME: 80%     Co-Pay: 20% Providers: in network  SECONDARY:       Policy#:      Phone#:    Artist:       Phone#:    The Best Boy for patients in Inpatient Rehabilitation Facilities with attached Privacy Act Statement-Health Care Records was provided and verbally reviewed with: Patient   Emergency Contact Information Contact Information       Name Relation Home Work Mobile    Hathorne,[name] Spouse [phone]   [phone]         Other Contacts   None on File        Current Medical History  Patient Admitting Diagnosis: Encephalopathy   History of Present Illness:  A 69 y.o. 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), Anxiety, Aortoiliac occlusive disease (HCC) (08/14/2017), Arthritis, B12 deficiency anemia (09/14/2015), Blood transfusion without reported diagnosis, BPPV (benign paroxysmal positional vertigo) (07/08/2015), Cardiomyopathy (HCC) (11/03/2017), Chronic back pain, Chronic daily  headache (03/29/2013), Closed right hip fracture (HCC) (09/10/2015), Coronary artery disease involving native coronary artery of native heart without angina pectoris (10/13/2017), Depression, Essential (hemorrhagic) thrombocythemia (HCC) (09/06/2018), Essential hypertension (09/25/2023), Family history of early CAD (04/08/2016), GERD (gastroesophageal reflux disease), History of hiatal hernia, Hot flashes, Hyperlipidemia, Hyperlipidemia associated with type 2 diabetes mellitus (HCC) (05/11/2017), Hypothyroidism (09/10/2015), IBS (irritable bowel syndrome) (03/29/2013), Neck pain (07/08/2015), 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 . She was initially admitted to [facility] on 10/23/2024 for planned robotic hernia repair by Dr. Rubin.  Postop course was complicated by hypotension requiring pressor support and ICU admission, ultimately determined to be hemorrhagic shock in the setting of hemoperitoneum.  On 12-4, she was brought back to the OR, no active bleed was identified but the abdomen was packed and a wound VAC was placed.  12-5, she was brought back to the OR with no evidence of bleeding and abdomen was closed.  She was transitioned off of sedation but remained encephalopathic, and stat CT head and follow-up MRI on 12-8 showed bilateral strokes with scattered areas of restricted  diffusion throughout the cerebral hemispheres and cerebellum, along with edema in the right occipital and left temporal lobes.  Neurology was consulted, etiology likely profound intracranial hypoperfusion although some initial concern for embolic due to DIC, ultimately ruled out.  She was transferred to [facility] ICU on 10/29/24.  She was stabilized in the ICU, extubated 12-11, and subsequently transferred to [facility]  on 12-15.  Her hospitalization was otherwise complicated by AKI on CKD stage IIIb, Enterobacter cloacae a HCAP, dysphagia/malnutrition with PEG placement 12-30, metabolic encephalopathy, anemia, hypertension, poorly controlled pain, and neurogenic bowel/bladder.  PM&R was consulted to evaluate appropriateness for IPR admission.    Per chart review, patient lives in a private home with 24/7 assistance from her spouse who is physically capable of assisting her (recently cared for their parents during hospice transition), single level with one-step to enter with grab bars near the tub/shower and toilet.  Prior to admission, she was independent of mobility with no assistive device other community ambulation was limited at times.  She was independent of ADLs and IADLs, but got assistance from her spouse from grocery shopping.  Currently, she is min assist for bed mobility, min assist for sit to stand transfers and min assist +2 for gait up to 15 feet requiring frequent cueing and limited by fatigue.  She is min assist for grooming and mod assist for sitting balance, requiring increased time for initiation and limited by fatigue.  MBS 12-22 showed mild oropharyngeal dysphagia, she was advanced to a regular diet on 12-31 with transition to nocturnal tube feeds to support nutrition; she is eating 25 to 100% of meals.    Complete NIHSS TOTAL: 8   Patient's medical record from [facility] has been reviewed by the rehabilitation admission coordinator and physician.   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/14 60.4Gy/67fx and chemo   Anxiety     Aortoiliac occlusive disease (HCC) 08/14/2017   Arthritis     B12 deficiency anemia 09/14/2015   Blood transfusion without reported diagnosis     BPPV (benign paroxysmal positional vertigo) 07/08/2015   Cardiomyopathy (HCC)  11/03/2017   Chronic back pain     Chronic daily headache 03/29/2013    takes bc powder   Closed right hip fracture (HCC) 09/10/2015   Coronary artery disease involving native coronary artery of native heart without angina pectoris 10/13/2017    DES to mid RCA   Depression     Essential (hemorrhagic) thrombocythemia (HCC) 09/06/2018   Essential hypertension 09/25/2023   Family history of early CAD 04/08/2016   GERD (gastroesophageal reflux disease)     History of hiatal hernia     Hot flashes     Hyperlipidemia     Hyperlipidemia associated with type 2 diabetes mellitus (HCC) 05/11/2017   Hypothyroidism 09/10/2015   IBS (irritable bowel syndrome) 03/29/2013   Neck pain 07/08/2015   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    takes neurotin   Peripheral vascular disease 11/03/2017   QT prolongation     Sacroiliitis 09/06/2018   Spasms of the hands or feet 10/08/2017   Syncope 06/07/2016   Tubular adenoma of colon 09/08/2003   Vertigo     Wears glasses            Has the patient had major surgery during 100 days  prior to admission? Yes   Family History   family history includes Arthritis in her mother; Dementia in her mother; Heart attack in her father, maternal grandfather, maternal uncle, and mother; Heart disease in her father and mother; Hyperlipidemia in her father and mother; Hypertension in her father and mother; Irritable bowel syndrome in her mother; Lung cancer (age of onset: 42) in her brother; Prostate cancer in her father; Stroke (age of onset: 60) in her mother; Stroke (age of onset: 67) in her father; Thyroid  disease in her father and mother.   Current Medications [Current Medications]  [Current Medications]    Current Facility-Administered Medications:    acetaminophen  (TYLENOL ) tablet 1,000 mg, 1,000 mg, Oral, Q6H PRN, Hindman, Katherine G, RPH   amLODipine  (NORVASC ) tablet 10 mg, 10 mg, Oral, Daily, Hindman, Katherine G, RPH, 10 mg at 11/25/24 0750   aspirin  EC tablet  81 mg, 81 mg, Oral, Daily, Ghimire, Donalda HERO, MD, 81 mg at 11/25/24 0750   bisacodyl  (DULCOLAX) suppository 10 mg, 10 mg, Rectal, Daily PRN, Ghimire, Donalda HERO, MD   carvedilol  (COREG ) tablet 12.5 mg, 12.5 mg, Oral, BID WC, Hindman, Katherine G, RPH, 12.5 mg at 11/25/24 0748   feeding supplement (OSMOLITE 1.5 CAL) liquid 237 mL, 237 mL, Per Tube, TID, Ghimire, Donalda HERO, MD, 237 mL at 11/25/24 0749   feeding supplement (PROSource TF20) liquid 60 mL, 60 mL, Per Tube, Daily, Ghimire, Donalda HERO, MD, 60 mL at 11/25/24 0749   free water  200 mL, 200 mL, Per Tube, Q6H, Dennise Lavada POUR, MD, 200 mL at 11/25/24 9372   gabapentin  (NEURONTIN ) 250 MG/5ML solution 100 mg, 100 mg, Per Tube, Q12H, Singh, Prashant K, MD, 100 mg at 11/25/24 0751   Gerhardt's butt cream, , Topical, BID, Dagenhart, Jamie H, NP, Given at 11/25/24 0751   heparin  injection 5,000 Units, 5,000 Units, Subcutaneous, Q8H, Ghimire, Donalda HERO, MD, 5,000 Units at 11/25/24 9376   hydrALAZINE  (APRESOLINE ) injection 10 mg, 10 mg, Intravenous, Q4H PRN, Singh, Prashant K, MD, 10 mg at 11/08/24 0412   hydrALAZINE  (APRESOLINE ) tablet 50 mg, 50 mg, Oral, Q8H, Hindman, Katherine G, RPH, 50 mg at 11/25/24 9376   hydrocortisone  cream 1 %, , Topical, TID PRN, Elgergawy, Brayton RAMAN, MD   Influenza vac split trivalent PF (FLUZONE  HIGH-DOSE) injection 0.5 mL, 0.5 mL, Intramuscular, Tomorrow-1000, Rubin Calamity, MD   labetalol  (NORMODYNE ) injection 20 mg, 20 mg, Intravenous, Q2H PRN, Paliwal, Aditya, MD, 20 mg at 11/06/24 0536   levothyroxine  (SYNTHROID ) tablet 75 mcg, 75 mcg, Oral, Q0600, Hindman, Katherine G, RPH, 75 mcg at 11/25/24 9376   loperamide  (IMODIUM ) capsule 2 mg, 2 mg, Oral, PRN, Elgergawy, Dawood S, MD, 2 mg at 11/20/24 2043   methylphenidate  (RITALIN ) tablet 5 mg, 5 mg, Oral, q morning, Lue Elsie BROCKS, MD, 5 mg at 11/25/24 9376   ondansetron  (ZOFRAN -ODT) disintegrating tablet 4 mg, 4 mg, Oral, Q6H PRN **OR** ondansetron  (ZOFRAN )  injection 4 mg, 4 mg, Intravenous, Q6H PRN, Rubin Calamity, MD, 4 mg at 10/23/24 2023   Oral care mouth rinse, 15 mL, Mouth Rinse, 4 times per day, Claudene Toribio BROCKS, MD, 15 mL at 11/25/24 9248   Oral care mouth rinse, 15 mL, Mouth Rinse, PRN, Claudene Toribio BROCKS, MD   pantoprazole  (PROTONIX ) EC tablet 40 mg, 40 mg, Oral, QHS, Powell, Lisa K, RPH, 40 mg at 11/24/24 2200   polyethylene glycol (MIRALAX  / GLYCOLAX ) packet 17 g, 17 g, Oral, Daily, Ghimire, Donalda HERO, MD   rosuvastatin  (CRESTOR ) tablet 40  mg, 40 mg, Oral, Daily, Ghimire, Donalda HERO, MD, 40 mg at 11/25/24 0750   senna-docusate (Senokot-S) tablet 2 tablet, 2 tablet, Oral, QHS, Ghimire, Shanker M, MD   traMADol  (ULTRAM ) tablet 50 mg, 50 mg, Oral, Q12H PRN, Singh, Prashant K, MD, 50 mg at 11/25/24 9251    Patients Current Diet:  Diet Order                  Diet regular Room service appropriate? Yes with Assist; Fluid consistency: Thin  Diet effective now             Diet - low sodium heart healthy                         Precautions / Restrictions Precautions Precautions: Fall Precaution/Restrictions Comments: PEG Restrictions Weight Bearing Restrictions Per Provider Order: No    Has the patient had 2 or more falls or a fall with injury in the past year? No   Prior Activity Level Limited Community (1-2x/wk): Went out about 2 X a week, was driving, not working.   Prior Functional Level Self Care: Did the patient need help bathing, dressing, using the toilet or eating? Independent   Indoor Mobility: Did the patient need assistance with walking from room to room (with or without device)? Independent   Stairs: Did the patient need assistance with internal or external stairs (with or without device)? Independent   Functional Cognition: Did the patient need help planning regular tasks such as shopping or remembering to take medications? Independent   Patient Information Are you of Hispanic, Latino/a,or Spanish origin?: A.  No, not of Hispanic, Latino/a, or Spanish origin What is your race?: A. White Do you need or want an interpreter to communicate with a doctor or health care staff?: 0. No   Patient's Response To:  Health Literacy and Transportation Is the patient able to respond to health literacy and transportation needs?: Yes Health Literacy - How often do you need to have someone help you when you read instructions, pamphlets, or other written material from your doctor or pharmacy?: Rarely In the past 12 months, has lack of transportation kept you from medical appointments or from getting medications?: No In the past 12 months, has lack of transportation kept you from meetings, work, or from getting things needed for daily living?: No   Home Assistive Devices / Equipment Home Equipment: Grab bars - tub/shower, Grab bars - toilet, Hand held shower head, Shower seat - built in, BSC/3in1, [employer] (2 wheels), [employer] - single point, Wheelchair - manual   Prior Device Use: Indicate devices/aids used by the patient prior to current illness, exacerbation or injury? None of the above   Current Functional Level Cognition   Arousal/Alertness: Awake/alert Overall Cognitive Status: Impaired/Different from baseline Orientation Level: Oriented to person, Oriented to place, Disoriented to time, Disoriented to situation Attention: Focused Focused Attention: Impaired Focused Attention Impairment: Functional basic    Extremity Assessment (includes Sensation/Coordination)   Upper Extremity Assessment: Generalized weakness RUE Deficits / Details: full PROM, attempting to actively using UE to reach and grab cortrack. not followign many commands but moving UE.  Repsonds to noxious stimuli. RUE Coordination: decreased fine motor, decreased gross motor LUE Deficits / Details: full PROM. not following many commands but moving UE. Repsonds to noxious stimuli. LUE Coordination: decreased gross motor, decreased fine motor   Lower Extremity Assessment: Defer to PT evaluation RLE Deficits / Details: no AROM noted, PROM WFL,  no response to painful stimuli LLE Deficits / Details: no AROM noted, PROM WFL, no response to painful stimuli     ADLs   Overall ADL's : Needs assistance/impaired Eating/Feeding: Set up, Sitting Grooming: Brushing hair, Sitting, Contact guard assist, Wash/dry face Grooming Details (indicate cue type and reason): min-mod A for sitting balance while pt brushing hair; needed ~1 minute to initiate Upper Body Bathing: Contact guard assist, Sitting Lower Body Bathing: Moderate assistance, Sit to/from stand Upper Body Dressing : Contact guard assist, Sitting Lower Body Dressing: Moderate assistance, Sit to/from stand Toilet Transfer: Minimal assistance, Ambulation, Regular Toilet, Rolling walker (2 wheels) Functional mobility during ADLs: Total assistance, +2 for physical assistance General ADL Comments: total care     Mobility   Overal bed mobility: Needs Assistance Bed Mobility: Supine to Sit, Sit to Supine Rolling: Modified independent (Device/Increase time) Sidelying to sit: Max assist, Used rails Supine to sit: Contact guard, Used rails Sit to supine: Contact guard assist Sit to sidelying: Mod assist General bed mobility comments: cues for sequencing and technique. increased time and CGA for safety     Transfers   Overall transfer level: Needs assistance Equipment used: Rolling walker (2 wheels) Transfers: Sit to/from Stand Sit to Stand: Min assist Bed to/from chair/wheelchair/BSC transfer type:: Step pivot Squat pivot transfers: Max assist Step pivot transfers: Min assist, +2 physical assistance Transfer via Lift Equipment: Stedy General transfer comment: From EOB x2 with min A for rise and cues for hand placement     Ambulation / Gait / Stairs / Wheelchair Mobility   Ambulation/Gait Ambulation/Gait assistance: Min assist, +2 safety/equipment Gait Distance (Feet): 50 Feet  (+75) Assistive device: Rolling walker (2 wheels) Gait Pattern/deviations: Narrow base of support, Trunk flexed, Step-to pattern, Decreased stride length, Ataxic, Step-through pattern General Gait Details: Pt initially demonstrating slow steps with cues for sequencing progressing to improved fluidity and step-through pattern. Min A for stability and intermittent RW management. Ataxia noted with decreased awareness of foot placement and narrow BOS Gait velocity interpretation: <1.31 ft/sec, indicative of household ambulator     Posture / Balance Dynamic Sitting Balance Sitting balance - Comments: CGA for static sitting EOB and up to min A with dynamic reaching task causing LOB x3 Balance Overall balance assessment: Needs assistance Sitting-balance support: Bilateral upper extremity supported Sitting balance-Leahy Scale: Fair Sitting balance - Comments: CGA for static sitting EOB and up to min A with dynamic reaching task causing LOB x3 Postural control:  (Significant anterior lean) Standing balance support: Reliant on assistive device for balance Standing balance-Leahy Scale: Poor Standing balance comment: reliant on RW and external support     Special considerations/life events  Skin Gastric tube in place for feedings, post op abdominal incision with dressing, buttocks dermatitis and Special service needs      Previous Home Environment (from acute therapy documentation) Living Arrangements: Spouse/significant other  Lives With: Spouse Available Help at Discharge: Family, Available 24 hours/day Type of Home: House Home Layout: One level Home Access: Stairs to enter [employer] of Steps: 1 Bathroom Shower/Tub: Health Visitor: Handicapped height Bathroom Accessibility: Yes Home Care Services: No Additional Comments: 1 cat and 1 dog at home   Discharge Living Setting Plans for Discharge Living Setting: Patient's home, House, Lives with (comment) (Lives with  54 yo husband) Type of Home at Discharge: House Discharge Home Layout: One level Discharge Home Access: Stairs to enter Entrance Stairs-Rails: None Entrance Stairs-Number of Steps: 1 Discharge Bathroom Shower/Tub: Psychologist, counselling, Door Discharge Bathroom  Toilet: Handicapped height Discharge Bathroom Accessibility: Yes How Accessible: Accessible via wheelchair, Accessible via walker Does the patient have any problems obtaining your medications?: No   Social/Family/Support Systems Patient Roles: Spouse Contact Information: Ludivina Guymon - husband Anticipated Caregiver: Briyanna Billingham husband - [phone] Ability/Limitations of Caregiver: Husband can assist Caregiver Availability: 24/7 Discharge Plan Discussed with Primary Caregiver: Yes Is Caregiver In Agreement with Plan?: Yes Does Caregiver/Family have Issues with Lodging/Transportation while Pt is in Rehab?: No   Goals Patient/Family Goal for Rehab: PT/OT/SLP supervision goals Expected length of stay: 10-14 days Pt/Family Agrees to Admission and willing to participate: Yes Program Orientation Provided & Reviewed with Pt/Caregiver Including Roles  & Responsibilities: Yes   Decrease burden of Care through IP rehab admission: N/A   Possible need for SNF placement upon discharge: Not planned   Patient Condition: I have reviewed medical records from [facility], spoken with CSW, and patient and spouse. I met with patient at the bedside for inpatient rehabilitation assessment.  Patient will benefit from ongoing PT, OT, and SLP, can actively participate in 3 hours of therapy a day 5 days of the week, and can make measurable gains during the admission.  Patient will also benefit from the coordinated team approach during an Inpatient Acute Rehabilitation admission.  The patient will receive intensive therapy as well as Rehabilitation physician, nursing, social worker, and care management interventions.  Due to bladder management,  bowel management, safety, skin/wound care, disease management, medication administration, pain management, and patient education the patient requires 24 hour a day rehabilitation nursing.  The patient is currently min A with mobility and basic ADLs.  Discharge setting and therapy post discharge at home with home health is anticipated.  Patient has agreed to participate in the Acute Inpatient Rehabilitation Program and will admit today.   Preadmission Screen Completed By:  Leita KATHEE Kleine, 11/25/2024 10:52 AM ______________________________________________________________________   Discussed status with Dr. Babs on 11/25/24 at 900 and received approval for admission today.   Admission Coordinator:  Leita KATHEE Kleine, CCC-SLP, time 1100/Date 11/25/24    Assessment/Plan: Diagnosis: encephalopathy/cva Does the need for close, 24 hr/day Medical supervision in concert with the patient's rehab needs make it unreasonable for this patient to be served in a less intensive setting? Yes Co-Morbidities requiring supervision/potential complications: pvd, aki on ckd, nutrition, dysphagia Due to bladder management, bowel management, safety, skin/wound care, disease management, medication administration, pain management, and patient education, does the patient require 24 hr/day rehab nursing? Yes Does the patient require coordinated care of a physician, rehab nurse, PT, OT, and SLP to address physical and functional deficits in the context of the above medical diagnosis(es)? Yes Addressing deficits in the following areas: balance, endurance, locomotion, strength, transferring, bowel/bladder control, bathing, dressing, feeding, grooming, toileting, cognition, swallowing, and psychosocial support Can the patient actively participate in an intensive therapy program of at least 3 hrs of therapy 5 days a week? Yes The potential for patient to make measurable gains while on inpatient rehab is excellent Anticipated functional  outcomes upon discharge from inpatient rehab: supervision PT, supervision OT, supervision SLP Estimated rehab length of stay to reach the above functional goals is:  7 days Anticipated discharge destination: Home 10. Overall Rehab/Functional Prognosis: excellent     MD Signature: Arthea IVAR Babs, MD, [facility] Physical Medicine & Rehabilitation Medical Director Rehabilitation Services 11/25/2024  Revision History  Date/Time User Provider Type Action  11/25/2024 10:59 AM Babs Arthea DASEN, MD Physician Sign  11/25/2024 10:52 AM  Cecilie Leita KATHEE RAEJEAN Rehab Admission Coordinator Share  11/23/2024  1:14 PM Duanne Lovett HERO, RN Rehab Admission Coordinator Share  11/23/2024 11:58 AM Duanne Lovett HERO, RN Rehab Admission Coordinator Share  11/23/2024 11:54 AM Logue, Lovett HERO, RN Rehab Admission Coordinator Share

## 2024-11-25 NOTE — Progress Notes (Signed)
 Met with patient and husband to review current situation, tea conference and plan of care. Reviewed medications, dietary modifications, stroke prophylaxis. Reviewed gastrotomy tube care and increasing po intake. Reviewed incision care, bowel, bladder , pain and sleep. Continue to follow along to provide educational needs to facilitate preparation for discharge.

## 2024-11-25 NOTE — Discharge Summary (Signed)
 "  PATIENT DETAILS Name: Erin [name] Age: 69 y.o. Sex: female Date of Birth: 1956/05/09 MRN: [id number]. Admitting Physician: Lynda Leos, MD [name], [name], MD  Admit Date: 10/23/2024 Discharge date: 11/25/2024  Recommendations for Outpatient Follow-up:  Follow up with PCP in 1-2 weeks Please obtain CMP/CBC in one week To reassess to see if PEG tube can be discontinued prior to discharge from CIR.  Admitted From:  Home  Disposition: CIR   Discharge Condition: good  CODE STATUS:   Code Status: Full Code   Diet recommendation:  Diet Order             Diet regular Room service appropriate? Yes with Assist; Fluid consistency: Thin  Diet effective now           Diet - low sodium heart healthy                    Brief Summary: Patient is a 69 y.o.  female with history of CAD s/p PCI, PAD s/p B/L femoral bypass 2014-mesenteric stent placement 08/2024, DM-2, HTN, HLD, anal cancer-s/p chemo/XRT 2014-who was initially admitted by [facility] for incisional hernia on 12/3-unfortunately-developed postoperative hemorrhagic shock in the setting of hemoperitoneum requiring intubation/pressors and transferred to ICU.  Further hospital course complicated by AKI, B/L CVA in the setting of hypotension.  She was stabilized in the ICU-subsequently transferred to [facility] on 12/15.  See below for further details.   Significant events: 12/3>> incisional hernia repair-developed postoperative hypotension and acute blood loss anemia-Enterline placed-transferred to ICU  12/4>> back to operating room-hemoperitoneum-active bleed not identified-abdomen packed-wound VAC placed-left intubated-transferred back to the ICU with open abdomen. 12/5>> back to the operating room-no evidence of bleeding-abdomen closed 12/8>> neuroimaging suggestive of CVA 12/11>> extubated 12/15>> transferred to [facility] 12/30>> PEG by IR   Significant studies: 11/28>> A1c: 4.9 12/4>> CT angio abdomen/pelvis: Large  right intraperitoneal hematoma with active bleeding from the right epigastric artery, left renal artery occluded at the origin with minimal enhancement.  Severe narrowing of the SMA immediately distal to the stent which is patent. 12/8>> MRI brain: Scattered acute infarcts in B/L cerebral hemisphere/cerebellum. 12/8>> MRI brain: No LVO-no significant intra or cranial stenosis. 12/8>> echo: EF 60-65% 12/9>> carotid Doppler: No significant stenosis 12/9>> LDL: 13 12/24>> CT abdomen: Safe percutaneous window for gastrostomy placement   Significant microbiology data: 12/6>> tracheal aspirate: Enterobacter cloacae 12/9>> tracheal aspirate: Respiratory flora 12/13>> stool C. difficile: Negative 12/13>> GI pathogen panel: No growth   Procedures: See above   Consults: General Surgery PCCM Nephrology Neurology IR  Brief Hospital Course: Postoperative hemorrhagic shock with acute blood loss anemia following incisional hernia repair on 12/3 Resolved-Hb/BP now stable. Did require massive transfusion protocol with 4 units of PRBC/2 units of cryoprecipitate/1 unit of FFP and vasopressors for resuscitation. Follow CBC periodically   S/p elective incisional hernia repaiR 12/3-complicated by intra-abdominal hematoma/postoperative hemorrhagic shock requiring reexploration on 12/4 and 12/5 Postop care managed by general surgery Surgery team following intermittently   AKI on CKD stage IIIb Secondary to ATN-from hemorrhagic shock Renal function improving with supportive care   Right renal infarct Seen incidentally on CT imaging on 12/4-likely due to hypoperfusion in setting of hypotension Supportive care at this point.   Acute hypoxic respiratory failure Intubated 12/4-extubated 12/11 Currently on room air   HCAP (Enterobacter cloacae on tracheal aspirate culture) Completed a course of antibiotics   Acute bilateral cerebellar/cerebral infarct secondary to profound intracranial  hypoperfusion due to hemorrhagic shock/hypotension. Workup as  above Oropharyngeal dysphagia-improved but oral intake is poor-underwent PEG tube placement 12/30 Significant improvement in aphasia-almost able to talk fluently and in full sentences. Continue ASA   Oropharyngeal dysphagia Secondary to CVA/anoxic injury Followed by SLP-tolerating dysphagia 3 diet PEG tube inserted on 12/30 to supplement caloric intake (per prior notes-oral intake erratic/poor)-seems to be tolerating nocturnal tube feeds well. Continue to encourage oral intake during the daytime. Please reassess if PEG tube can be discontinued prior to discharge from CIR.   Acute metabolic encephalopathy Secondary to acute/critical illness/CVA/potential anoxic injury from hypotension Thankfully-significantly improved after treatment of underlying etiologies Delirium precautions   Multifactorial anemia Secondary to hemorrhagic shock-critical illness Hb stable Follow CBC periodically   History of left renal artery stenosis Supportive care   History of PAD-s/p prior aortobifem surgery 2014-stenting of SMA October 2025 (Dr. Serene) Supportive care On aspirin    History of peripheral neuropathy On Neurontin -significantly higher doses at home-cut back currently due to encephalopathy   HTN BP stable Amlodipine /Coreg /hydralazine    HLD Statin   Hypothyroidism Synthroid    DVT/deconditioning Secondary to critical illness SNF recommended but now CIR also taking a look.   Nutrition Status: Nutrition Problem: Severe Malnutrition Etiology: chronic illness Signs/Symptoms: severe muscle depletion, severe fat depletion Percent weight loss: 15 % (in 6 months) Interventions: Magic cup, Tube feeding, Liberalize Diet, Refer to RD note for recommendations   Underweight: Estimated body mass index is 17.69 kg/m as calculated from the following:   Height as of this encounter: 5' 3 (1.6 m).   Weight as of this encounter:  45.3 kg.    Discharge Diagnoses:  Principal Problem:   S/P hernia repair Active Problems:   Shock (HCC)   Malnutrition of moderate degree   Protein-calorie malnutrition, severe   Discharge Instructions:  Activity:  As tolerated with Full fall precautions use walker/cane & assistance as needed  Discharge Instructions     Diet - low sodium heart healthy   Complete by: As directed    Increase activity slowly   Complete by: As directed    Increase activity slowly   Complete by: As directed    No wound care   Complete by: As directed       Allergies as of 11/25/2024       Reactions   Latex Itching   Must use paper tape   Amitiza  [lubiprostone ] Diarrhea   Uncontrollable diarrhea   Nortriptyline  Other (See Comments)   Stomach distention   Sulfa Antibiotics Nausea And Vomiting, Other (See Comments)   GI distress/pain   Tramadol  Itching, Rash   Atorvastatin  Nausea And Vomiting   Claritin [loratadine] Other (See Comments)   Hot flashes        Medication List     STOP taking these medications    clopidogrel  75 MG tablet Commonly known as: PLAVIX    DULoxetine  20 MG capsule Commonly known as: CYMBALTA    gabapentin  600 MG tablet Commonly known as: NEURONTIN  Replaced by: gabapentin  250 MG/5ML solution   potassium chloride  SA 20 MEQ tablet Commonly known as: Klor-Con  M20   rOPINIRole  1 MG tablet Commonly known as: REQUIP    tiZANidine 4 MG tablet Commonly known as: ZANAFLEX   topiramate  50 MG tablet Commonly known as: TOPAMAX        TAKE these medications    acetaminophen  500 MG tablet Commonly known as: TYLENOL  Take 1,000 mg by mouth every 6 (six) hours as needed for headache (pain).   acyclovir  ointment 5 % Commonly known as: ZOVIRAX  Apply 1 Application topically  every 3 (three) hours as needed (fever blisters).   amLODipine  5 MG tablet Commonly known as: NORVASC  Take 10 mg by mouth daily.   aspirin  EC 81 MG tablet Take 1 tablet (81 mg  total) by mouth daily. Swallow whole.   bisacodyl  10 MG suppository Commonly known as: DULCOLAX Place 1 suppository (10 mg total) rectally daily as needed for moderate constipation.   carvedilol  12.5 MG tablet Commonly known as: COREG  Take 12.5 mg by mouth 2 (two) times daily.   cyanocobalamin  1000 MCG tablet Take 1 tablet (1,000 mcg total) by mouth daily.   feeding supplement (OSMOLITE 1.5 CAL) Liqd Place 237 mLs into feeding tube 3 (three) times daily.   feeding supplement (PROSource TF20) liquid Place 60 mLs into feeding tube daily. Start taking on: November 26, 2024   fluticasone  50 MCG/ACT nasal spray Commonly known as: FLONASE  SPRAY 1 SPRAY INTO EACH NOSTRIL DAILY   free water  Soln Place 200 mLs into feeding tube every 6 (six) hours.   gabapentin  250 MG/5ML solution Commonly known as: NEURONTIN  Place 2 mLs (100 mg total) into feeding tube every 12 (twelve) hours. Replaces: gabapentin  600 MG tablet   Gerhardt's butt cream Crea Apply 1 Application topically 2 (two) times daily.   hydrALAZINE  50 MG tablet Commonly known as: APRESOLINE  Take 1 tablet (50 mg total) by mouth every 8 (eight) hours. What changed:  medication strength how much to take how to take this when to take this additional instructions   ketoconazole  2 % shampoo Commonly known as: NIZORAL  Apply to hair daily for 7 days -- leave on for 5 minutes, then wash every other day for 7 days, then wash twice a week for 7 days,  then once a week.   levothyroxine  75 MCG tablet Commonly known as: SYNTHROID  TAKE 1 TABLET BY MOUTH DAILY BEFORE BREAKFAST MONDAY-FRIDAY, THEN 1/2 SATURDAY AND SUNDAY   meclizine  25 MG tablet Commonly known as: ANTIVERT  TAKE 1 TABLET BY MOUTH THREE TIMES A DAY AS NEEDED FOR DIZZINESS   methylphenidate  5 MG tablet Commonly known as: RITALIN  Take 1 tablet (5 mg total) by mouth every morning. Start taking on: November 26, 2024   metoCLOPramide  5 MG tablet Commonly known as:  REGLAN  TAKE 1 TABLET BY MOUTH EVERY 6 HOURS AS NEEDED FOR NAUSEA.   nitroGLYCERIN  0.4 MG SL tablet Commonly known as: NITROSTAT  Place 1 tablet (0.4 mg total) under the tongue every 5 (five) minutes as needed for chest pain.   nystatin -triamcinolone  cream Commonly known as: MYCOLOG II Apply 1 application topically 2 (two) times daily as needed (dry skin).   pantoprazole  40 MG tablet Commonly known as: PROTONIX  Take 1 tablet (40 mg total) by mouth daily. What changed: when to take this   polyethylene glycol 17 g packet Commonly known as: MIRALAX  / GLYCOLAX  Take 17 g by mouth daily.   rosuvastatin  40 MG tablet Commonly known as: CRESTOR  Take 1 tablet (40 mg total) by mouth daily.   senna-docusate 8.6-50 MG tablet Commonly known as: Senokot-S Take 2 tablets by mouth at bedtime.   Vitamin D  50 MCG (2000 UT) tablet Take 2,000 Units by mouth daily.        Contact information for follow-up providers     Rubin Calamity, MD Follow up in 3 week(s).   Specialty: General Surgery Contact information: [address] [place] [address] [phone]         Ozell Erin HERO, MD. Schedule an appointment as soon as possible for  a visit in 1 week(s).   Specialty: Family Medicine Contact information: [address] [place] [address] [phone]              Contact information for after-discharge care     Destination     [facility] [place], [place] .   Service: Skilled Nursing Contact information: [address] [place]  [address] [phone]                    Allergies[1]   Other Procedures/Studies: IR GASTROSTOMY TUBE MOD SED Result Date: 11/19/2024 INDICATION: 69 year old with dysphagia. Request for a gastrostomy tube placement. EXAM: PERCUTANEOUS GASTROSTOMY TUBE PLACEMENT WITH FLUOROSCOPIC GUIDANCE Physician: Juliene SAUNDERS. Henn, MD MEDICATIONS: Ancef  2 g, glucagon  0.5 mg ANESTHESIA/SEDATION:  Moderate (conscious) sedation was employed during this procedure. A total of Versed  2 mg and fentanyl  100 mcg was administered intravenously at the order of the provider performing the procedure. Total intra-service moderate sedation time: 14 minutes. Patient's level of consciousness and vital signs were monitored continuously by radiology nurse throughout the procedure under the supervision of the provider performing the procedure. FLUOROSCOPY: Radiation Exposure Index (as provided by the fluoroscopic device): 7 mGy Kerma COMPLICATIONS: None immediate. PROCEDURE: Informed consent was obtained for a percutaneous gastrostomy tube. The patient was placed on the interventional table. An orogastric tube was placed with fluoroscopic guidance. The anterior abdomen was prepped and draped in sterile fashion. Maximal barrier sterile technique was utilized including caps, mask, sterile gowns, sterile gloves, sterile drape, hand hygiene and skin antiseptic. Stomach was inflated with air through the orogastric tube. The skin and subcutaneous tissues were anesthetized with 1% lidocaine . Saf-T-Pexy T fastener was deployed within the stomach using fluoroscopic guidance. A 0.018 wire was advanced through the needle. Needle was removed and a micropuncture dilator set was placed. Super stiff Amplatz wire was advanced into the stomach. A 9-French vascular sheath was placed and the orogastric tube was snared using a Gooseneck snare device. The orogastric tube and snare were pulled out of the patient's mouth. The snare device was connected to a 20-French gastrostomy tube. The snare device and gastrostomy tube were pulled through the patient's mouth and out the anterior abdominal wall. The gastrostomy tube was cut to an appropriate length. T fastener was cut. Contrast injection through gastrostomy tube confirmed placement within the stomach. Fluoroscopic images were obtained for documentation. The gastrostomy tube was flushed with normal  saline. IMPRESSION: Successful fluoroscopic guided percutaneous gastrostomy tube placement. Electronically Signed   By: Juliene Balder M.D.   On: 11/19/2024 21:04   DG Abd 1 View Result Date: 11/19/2024 CLINICAL DATA:  Evaluate oral contrast prior to gastrostomy tube placement. EXAM: ABDOMEN - 1 VIEW COMPARISON:  CT abdomen 11/13/2024 FINDINGS: Feeding tube extends down the esophagus and terminates in the distal stomach/duodenal bulb region. There is oral contrast in the colon. Right hip replacement. Nonobstructive bowel gas pattern. IMPRESSION: 1. Oral contrast in the colon. 2. Feeding tube terminates in the distal stomach/duodenal bulb region. Electronically Signed   By: Juliene Balder M.D.   On: 11/19/2024 18:51   CT ABDOMEN WO CONTRAST Result Date: 11/13/2024 EXAM: CT ABDOMEN WITHOUT CONTRAST 11/13/2024 01:49:00 PM TECHNIQUE: CT of the abdomen was performed without the administration of intravenous contrast. Multiplanar reformatted images are provided for review. Automated exposure control, iterative reconstruction, and/or weight based adjustment of the mA/kV was utilized to reduce the radiation dose to as low as reasonably achievable. COMPARISON: 10/24/2024 and previous. CLINICAL HISTORY: Anatomy evaluation  for G-tube placement in IR. FINDINGS: LOWER CHEST: Interval improvement in small pleural effusions with resolving patchy atelectasis/consolidation posteriorly at both lung bases, worse left than right. Blood pool is hypodense compared to the interventricular septum suggesting anemia. HEPATOBILIARY: The visualized liver is unremarkable. Gallbladder is unremarkable. SPLEEN: Spleen demonstrates no acute abnormality. PANCREAS: Pancreas demonstrates no acute abnormality. ADRENAL GLANDS: Adrenal glands demonstrate no acute abnormality. KIDNEYS: 3 mm calcification peripherally in the upper pole left renal collecting system. No hydronephrosis. No perinephric or periureteral stranding. GI AND BOWEL: Feeding tube  extends to the proximal duodenum. There is safe percutaneous window for gastrostomy placement. Residual contrast in the visualized colon, which is nondilated. There is no bowel obstruction. No abnormal bowel wall thickening or distension. PERITONEUM AND RETROPERITONEUM: Aorta is normal in caliber. Origin stenting of celiac access and SMA. Post aortobifem. No ascites or free air. LYMPH NODES: No lymphadenopathy. BONES AND SOFT TISSUES: No acute abnormality of the visualized bones. No focal soft tissue abnormality. IMPRESSION: 1. Safe percutaneous window for gastrostomy placement. 2. Findings suggest anemia. Electronically signed by: Katheleen Faes MD 11/13/2024 04:01 PM EST RP Workstation: [id number]   DG Swallowing Func-Speech Pathology Result Date: 11/11/2024 Table formatting from the original result was not included. Modified Barium Swallow Study Patient Details Name: Erin [name] MRN: [id number] Date of Birth: 1956-05-28 Today's Date: 11/11/2024 HPI/PMH: HPI: 69 yo F adm to [facility] 12/3 for ventral hernia repair. 12/4 ex lap with abd hemorrhage, remained intubated post op. 12/5 abdominal closure. 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 12/9 transfer to [facility]. 12/11 extubated. BSE 11/01/24: NPO, ice chips after oral care. MBS 12/17: silent aspiration, DNFC; NPO. Significant issues with attn/cog. SLP following for cognitive-linguistic deficits and dysphagia. PMHx: anal CA, hypothyroidism, GERD, CAD, cardiomyopathy, PVD, HLD, HTN, RLS. Per 2023 MBS, mild pharyngeal dysphagia and suspected primary esophageal dysphagia. MBSS completed on 11/11/24 showed mild oropharygneal dysphagia with recommendations of a full liquid/thin liquid diet. Clinical Impression:  Pt presents with a mild oropharyngeal dysphagia per results of MBSS completed today. No aspiration observed; however, there was deep penetration of thin liquid residue to the level of the vocal cords, which increases pt's risk of aspiration. Recommend initiation of  a full liquids, thin liquids diet. SLP will follow to assess diet tolerance and clinically advance diet as indicated. Consider a repeat MBSS once Cortrak is removed. Oral deficits characterized by increased oral prep and transit time with soft solid. Pt able to clear bolus with additional time. Pharyngeal deficits characterized by reduced hyolaryngeal elevation/excursion, reduced base of tongue retraction, incomplete epiglottic inversion, reduced laryngeal vestibule closure, and reduced pharyngeal stripping. Findings -No aspiration observed across trials. -There was transient and stagnant penetration of thin liquids and nectar-thick liquids during and after the swallow. Pt eventually cleared penetrated residue with cued swallow or cued throat clear (followed direction x1 occurrence). -There was min-moderate vallecular and pyriform residue following the majority of residue. Amount of residue appeared to increased as the viscosity increased. *some frames were not recorded due to equipment issue. Detailed diet recommendations and aspiration precautions outlined below. Continue SLP PoC. Factors that may increase risk of adverse event in presence of aspiration Noe & Lianne 2021): Factors that may increase risk of adverse event in presence of aspiration Noe & Lianne 2021): Reduced cognitive function; Frail or deconditioned; Dependence for feeding and/or oral hygiene Recommendations/Plan: Swallowing Evaluation Recommendations Swallowing Evaluation Recommendations Recommendations: PO diet PO Diet Recommendation: Full liquid diet; Thin liquids (Level 0) Liquid Administration via: Cup; No straw Medication Administration:  Crushed with puree Supervision: Staff to assist with self-feeding; Full supervision/cueing for swallowing strategies Swallowing strategies  : Minimize environmental distractions; Slow rate; Small bites/sips; Multiple dry swallows after each bite/sip Postural changes: Position pt fully upright for  meals; Stay upright 30-60 min after meals Oral care recommendations: Oral care BID (2x/day) Treatment Plan Treatment Plan Treatment recommendations: Therapy as outlined in treatment plan below Follow-up recommendations: Follow physicians's recommendations for discharge plan and follow up therapies Functional status assessment: Patient has had a recent decline in their functional status and demonstrates the ability to make significant improvements in function in a reasonable and predictable amount of time. Treatment frequency: Min 2x/week Treatment duration: 3 weeks Interventions: Aspiration precaution training; Oropharyngeal exercises; Compensatory techniques; Patient/family education; Trials of upgraded texture/liquids; Diet toleration management by SLP Recommendations Recommendations for follow up therapy are one component of a multi-disciplinary discharge planning process, led by the attending physician.  Recommendations may be updated based on patient status, additional functional criteria and insurance authorization. Assessment: Orofacial Exam: Orofacial Exam Oral Cavity: Oral Hygiene: WFL Oral Cavity - Dentition: Missing dentition Orofacial Anatomy: WFL Oral Motor/Sensory Function: -- (unable to assess d/t cognitive deficits) Anatomy: Anatomy: WFL Boluses Administered: Boluses Administered Boluses Administered: Thin liquids (Level 0); Mildly thick liquids (Level 2, nectar thick); Puree; Solid  Oral Impairment Domain: Oral Impairment Domain Lip Closure: No labial escape Tongue control during bolus hold: Not tested Bolus preparation/mastication: Slow prolonged chewing/mashing with complete recollection Bolus transport/lingual motion: Brisk tongue motion Oral residue: Trace residue lining oral structures Initiation of pharyngeal swallow : Valleculae  Pharyngeal Impairment Domain: Pharyngeal Impairment Domain Soft palate elevation: No bolus between soft palate (SP)/pharyngeal wall (PW) Laryngeal elevation: Partial  superior movement of thyroid  cartilage/partial approximation of arytenoids to epiglottic petiole Anterior hyoid excursion: Partial anterior movement Epiglottic movement: Partial inversion Laryngeal vestibule closure: Incomplete, narrow column air/contrast in laryngeal vestibule Pharyngeal stripping wave : Present - diminished Pharyngeal contraction (A/P view only): N/A Pharyngoesophageal segment opening: Complete distension and complete duration, no obstruction of flow Tongue base retraction: Narrow column of contrast or air between tongue base and PPW Pharyngeal residue: Collection of residue within or on pharyngeal structures Location of pharyngeal residue: Valleculae; Pyriform sinuses  Esophageal Impairment Domain: Esophageal Impairment Domain Esophageal clearance upright position: -- (not assessed) Pill: Pill Consistency administered: -- (not assessed) Penetration/Aspiration Scale Score: Penetration/Aspiration Scale Score 1.  Material does not enter airway: Puree; Solid 2.  Material enters airway, remains ABOVE vocal cords then ejected out: Thin liquids (Level 0); Mildly thick liquids (Level 2, nectar thick) 3.  Material enters airway, remains ABOVE vocal cords and not ejected out: Mildly thick liquids (Level 2, nectar thick) 5.  Material enters airway, CONTACTS cords and not ejected out: Thin liquids (Level 0) Compensatory Strategies: Compensatory Strategies Compensatory strategies: Yes Other(comment): Effective Effective Other(comment): Puree; Solid; Thin liquid (Level 0) (downward pressure on tongue with spoon to cue dry swallow)   General Information: Caregiver present: No  Diet Prior to this Study: NPO   Temperature : Normal   Respiratory Status: WFL   Supplemental O2: None (Room air)   History of Recent Intubation: Yes  Behavior/Cognition: Alert; Cooperative Self-Feeding Abilities: Needs assist with self-feeding Baseline vocal quality/speech: Normal Volitional Cough: Unable to elicit Volitional Swallow:  Unable to elicit Exam Limitations: No limitations Goal Planning: Prognosis for improved oropharyngeal function: Good Barriers to Reach Goals: Cognitive deficits; Language deficits No data recorded Patient/Family Stated Goal: discharge home Consulted and agree with results and recommendations: Patient; Family member/caregiver; Nurse;  Physician Pain: Pain Assessment Pain Assessment: No/denies pain Pain Score: 0 Faces Pain Scale: 0 Breathing: 0 Negative Vocalization: 0 Facial Expression: 0 Body Language: 0 Consolability: 0 PAINAD Score: 0 End of Session: Start Time:SLP Start Time (ACUTE ONLY): 1330 Stop Time: SLP Stop Time (ACUTE ONLY): 1349 Time Calculation:SLP Time Calculation (min) (ACUTE ONLY): 19 min Charges: SLP Evaluations $ SLP Speech Visit: 1 Visit SLP Evaluations $MBS Swallow: 1 Procedure $Swallowing Treatment: 1 Procedure SLP visit diagnosis: SLP Visit Diagnosis: Dysphagia, pharyngeal phase (R13.13); Cognitive communication deficit (R41.841) Past Medical History: Past Medical History: Diagnosis Date  Allergy   Alopecia   Anal cancer (HCC) 08/14/2013  invasive squamous cell ca, s/p radiation 10/20-11/26/14 60.4Gy/89fx and chemo  Anxiety   Aortoiliac occlusive disease (HCC) 08/14/2017  Arthritis   B12 deficiency anemia 09/14/2015  Blood transfusion without reported diagnosis   BPPV (benign paroxysmal positional vertigo) 07/08/2015  Cardiomyopathy (HCC) 11/03/2017  Chronic back pain   Chronic daily headache 03/29/2013  takes bc powder  Closed right hip fracture (HCC) 09/10/2015  Coronary artery disease involving native coronary artery of native heart without angina pectoris 10/13/2017  DES to mid RCA  Depression   Essential (hemorrhagic) thrombocythemia (HCC) 09/06/2018  Essential hypertension 09/25/2023  Family history of early CAD 04/08/2016  GERD (gastroesophageal reflux disease)   History of hiatal hernia   Hot flashes   Hyperlipidemia   Hyperlipidemia associated with type 2 diabetes mellitus (HCC)  05/11/2017  Hypothyroidism 09/10/2015  IBS (irritable bowel syndrome) 03/29/2013  Neck pain 07/08/2015  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  takes neurotin  Peripheral vascular disease 11/03/2017  QT prolongation   Sacroiliitis 09/06/2018  Spasms of the hands or feet 10/08/2017  Syncope 06/07/2016  Tubular adenoma of colon 09/08/2003  Vertigo   Wears glasses  Past Surgical History: Past Surgical History: Procedure Laterality Date  ABDOMINAL AORTOGRAM N/A 08/02/2017  Procedure: ABDOMINAL AORTOGRAM;  Surgeon: Darron Deatrice LABOR, MD;  [facility];  Service: Cardiovascular;  Laterality: N/A;  ABDOMINAL AORTOGRAM W/LOWER EXTREMITY Bilateral 09/03/2024  Procedure: ABDOMINAL AORTOGRAM W/LOWER EXTREMITY;  Surgeon: Serene Gaile ORN, MD;  [facility];  Service: Cardiovascular;  Laterality: Bilateral;  AORTA - BILATERAL FEMORAL ARTERY BYPASS GRAFT N/A 08/14/2017  Procedure: AORTA BIFEMORAL BYPASS GRAFT;  Surgeon: Oris Krystal FALCON, MD;  Location[facility] OR;  Service: Vascular;  Laterality: N/A;  COLONOSCOPY    COLONOSCOPY N/A 08/29/2024  Procedure: COLONOSCOPY;  Surgeon: Wilhelmenia Aloha Raddle., MD;  [facility]: [facility];  Service: Gastroenterology;  Laterality: N/A;  CORONARY STENT INTERVENTION N/A 10/13/2017  Procedure: CORONARY STENT INTERVENTION;  Surgeon: Mady Bruckner, MD;  [facility];  Service: Cardiovascular;  Laterality: N/A;  dental implant    ECTOPIC PREGNANCY SURGERY    EMBOLECTOMY N/A 08/14/2017  Procedure: EMBOLECTOMY FEMORAL;  Surgeon: Oris Krystal FALCON, MD;  Location: [facility] OR;  Service: Vascular;  Laterality: N/A;  FEMORAL-POPLITEAL BYPASS GRAFT Right 08/14/2017  Procedure: Right Femoral to Above Knee Popliteal Bypass Graft using Non-Reversed Greater Saphenous Vein Graft from Right Leg;  Surgeon: Oris Krystal FALCON, MD;  Location: [facility] OR;  Service: Vascular;  Laterality: Right;  FLEXIBLE SIGMOIDOSCOPY N/A 08/14/2013  Procedure: FLEXIBLE SIGMOIDOSCOPY;  Surgeon: Gwendlyn ONEIDA Buddy, MD;   [facility];  Service: Endoscopy;  Laterality: N/A;  LAPAROTOMY N/A 10/24/2024  Procedure: EXPLORATORY LAPAROTOMY, ABDOMINAL PACKING AND WOUND VAC APPLICATION;  Surgeon: Debby Hila, MD;  Location: [facility];  Service: General;  Laterality: N/A;  (8) EIGHT 18X18 LAP SPONGE PACKED  LAPAROTOMY N/A 10/25/2024  Procedure:  LAPAROTOMY, EXPLORATORY;  Surgeon: Rubin Calamity, MD;  [facility]: [facility];  Service: General;  Laterality: N/A;  REMOVAL OF PACKING  LEFT HEART CATH AND CORONARY ANGIOGRAPHY N/A 10/13/2017  Procedure: LEFT HEART CATH AND CORONARY ANGIOGRAPHY;  Surgeon: Mady Bruckner, MD;  [facility];  Service: Cardiovascular;  Laterality: N/A;  LOWER EXTREMITY ANGIOGRAPHY Bilateral 08/02/2017  Procedure: Lower Extremity Angiography;  Surgeon: Darron Deatrice LABOR, MD;  Location: [facility] INV[facility] CV [facility];  Service: Cardiovascular;  Laterality: Bilateral;  LOWER EXTREMITY INTERVENTION N/A 09/03/2024  Procedure: LOWER EXTREMITY INTERVENTION;  Surgeon: Serene Gaile ORN, MD;  [facility];  Service: Cardiovascular;  Laterality: N/A;  MULTIPLE TOOTH EXTRACTIONS    PERIPHERAL VASCULAR INTERVENTION  05/22/2018  Procedure: PERIPHERAL VASCULAR INTERVENTION;  Surgeon: Serene Gaile ORN, MD;  [facility];  Service: Cardiovascular;;  SMA and Celiac  PILONIDAL CYST EXCISION    POLYPECTOMY    THROMBECTOMY FEMORAL ARTERY Right 08/14/2017  Procedure: THROMBECTOMY FEMORAL ARTERY;  Surgeon: Oris Krystal FALCON, MD;  Location: [facility] OR;  Service: Vascular;  Laterality: Right;  TONSILLECTOMY    TOTAL HIP ARTHROPLASTY  09/11/2015  Procedure: TOTAL HIP ARTHROPLASTY;  Surgeon: Evalene JONETTA Chancy, MD;  [facility];  Service: Orthopedics;;  ULTRASOUND GUIDANCE FOR VASCULAR ACCESS  10/13/2017  Procedure: Ultrasound Guidance For Vascular Access;  Surgeon: Mady Bruckner, MD;  [facility];  Service: Cardiovascular;;  VENTRAL HERNIA REPAIR N/A 10/23/2024  Procedure: REPAIR, HERNIA,  VENTRAL, LAPAROSCOPIC;  Surgeon: Rubin Calamity, MD;  [facility];  Service: General;  Laterality: N/A;  VISCERAL ANGIOGRAPHY N/A 03/03/2020  Procedure: MESTENRIC ANGIOGRAPHY;  Surgeon: Serene Gaile ORN, MD;  [facility];  Service: Cardiovascular;  Laterality: N/A;  VISCERAL ANGIOGRAPHY N/A 09/03/2024  Procedure: VISCERAL ANGIOGRAPHY;  Surgeon: Serene Gaile ORN, MD;  [facility];  Service: Cardiovascular;  Laterality: N/A;  VISCERAL ARTERY INTERVENTION N/A 09/03/2024  Procedure: VISCERAL ARTERY INTERVENTION;  Surgeon: Serene Gaile ORN, MD;  [facility];  Service: Cardiovascular;  Laterality: N/A; [name] 11/11/2024, 3:17 PM  DG Chest Port 1 View Result Date: 11/07/2024 EXAM: 1 VIEW(S) XRAY OF THE CHEST 11/07/2024 06:03:23 AM COMPARISON: 11/01/2024 CLINICAL HISTORY: SOB (shortness of breath) FINDINGS: LINES, TUBES AND DEVICES: Enteric tube below diaphragm with distal tip beyond inferior margin of film. Left renal artery stent noted. LUNGS AND PLEURA: Right basilar airspace opacity. Left basilar atelectasis. No pleural effusion. No pneumothorax. HEART AND MEDIASTINUM: Aortic atherosclerosis. No acute abnormality of the cardiac silhouette. BONES AND SOFT TISSUES: No acute osseous abnormality. IMPRESSION: 1. Right basilar airspace opacity and left basilar atelectasis. Electronically signed by: Waddell Calk MD 11/07/2024 06:35 AM EST RP Workstation: [id number]   DG Swallowing Func-Speech Pathology Result Date: 11/06/2024 Table formatting from the original result was not included. Modified Barium Swallow Study Patient Details Name: Erin [name] MRN: [id number] Date of Birth: [birth year] Today's Date: 11/06/2024 HPI/PMH: HPI: Mrs. Camerer is a 69 y.o. F  adm to [facility] on 12/3 for ventral hernia repair. 12/4 ex lap with abdominal hemorrhage and remained intubated post op. 12/5 abdominal closure. 12/8 MRI with multiple embolic infarcts. 12/9 transfer  to [facility]. 12/11 extubated. PMHx: anal CA, hypothyroidism, GERD, CAD, cardiomyopathy, PVD, HLD, HTN, RLS. SLP consulted for cogntitive-linguistic assessment and clinical swallow assessment. Pt with a hx of mild pharyngeal dysphagia and suspected primary esophageal dysphagia per MBSS completed 05/06/22 Clinical Impression: Clinical Impression: Patient presents with severe pharyngeal dysphagia characterized by impaired  effieciency and safety across all administered textures. At baseline, patient has mild pharyngeal dysphagia and suspect given current status, patient is unable to compensate for efficiency deficits resulting in airway compromise. Patient exhibits reduced hyolaryngeal excursion/elevation, epiglottic inversion (with epiglottis also abutting against posterior pharyngeal wall further preventing full inversion), pharyngeal stripping wave, and resultant closure of the laryngeal vestibule. Patient aspirates thin liquids silently during the swallow and mix of residue from thin, NTL, and HTLs after the swallow as appreciated by movement of residue material into the airway between images. Given current reduced cognitive status, patient is unable to follow commands adequately to trial any compensatory strategies. Recommend NPO at this time with medications adminsitere via alternative means. SLP will continue to follow. Factors that may increase risk of adverse event in presence of aspiration Noe & Lianne 2021): Factors that may increase risk of adverse event in presence of aspiration Noe & Lianne 2021): Weak cough; Reduced cognitive function; Poor general health and/or compromised immunity Recommendations/Plan: Swallowing Evaluation Recommendations Swallowing Evaluation Recommendations Recommendations: NPO Medication Administration: Via alternative means Oral care recommendations: Oral care QID (4x/day) Caregiver Recommendations: Have oral suction available Treatment Plan Treatment Plan Treatment  recommendations: Therapy as outlined in treatment plan below Follow-up recommendations: Follow physicians's recommendations for discharge plan and follow up therapies Functional status assessment: Patient has had a recent decline in their functional status and demonstrates the ability to make significant improvements in function in a reasonable and predictable amount of time. Treatment frequency: Min 2x/week Treatment duration: 2 weeks Interventions: Aspiration precaution training; Oropharyngeal exercises; Compensatory techniques; Patient/family education; Trials of upgraded texture/liquids; Diet toleration management by SLP Recommendations Recommendations for follow up therapy are one component of a multi-disciplinary discharge planning process, led by the attending physician.  Recommendations may be updated based on patient status, additional functional criteria and insurance authorization. Assessment: Orofacial Exam: Orofacial Exam Oral Cavity: Oral Hygiene: WFL Oral Cavity - Dentition: Missing dentition Orofacial Anatomy: WFL Oral Motor/Sensory Function: -- (unable to assess d/t cognitive deficits) Anatomy: Anatomy: WFL Boluses Administered: Boluses Administered Boluses Administered: Thin liquids (Level 0); Mildly thick liquids (Level 2, nectar thick); Moderately thick liquids (Level 3, honey thick); Puree  Oral Impairment Domain: Oral Impairment Domain Lip Closure: No labial escape Tongue control during bolus hold: -- (unable to complete d/t impaired cognition) Bolus transport/lingual motion: Brisk tongue motion Oral residue: Trace residue lining oral structures Location of oral residue : Tongue; Palate Initiation of pharyngeal swallow : Valleculae  Pharyngeal Impairment Domain: Pharyngeal Impairment Domain Soft palate elevation: No bolus between soft palate (SP)/pharyngeal wall (PW) Laryngeal elevation: Partial superior movement of thyroid  cartilage/partial approximation of arytenoids to epiglottic petiole  Anterior hyoid excursion: Partial anterior movement Epiglottic movement: Partial inversion Laryngeal vestibule closure: Incomplete, narrow column air/contrast in laryngeal vestibule Pharyngeal stripping wave : Present - diminished Pharyngeal contraction (A/P view only): N/A Pharyngoesophageal segment opening: Complete distension and complete duration, no obstruction of flow Tongue base retraction: Narrow column of contrast or air between tongue base and PPW Pharyngeal residue: Collection of residue within or on pharyngeal structures Location of pharyngeal residue: Valleculae  Esophageal Impairment Domain: No data recorded Pill: No data recorded Penetration/Aspiration Scale Score: Penetration/Aspiration Scale Score 1.  Material does not enter airway: Puree 8.  Material enters airway, passes BELOW cords without attempt by patient to eject out (silent aspiration) : Thin liquids (Level 0); Mildly thick liquids (Level 2, nectar thick); Moderately thick liquids (Level 3, honey thick) Compensatory Strategies: Compensatory Strategies Compensatory strategies: No   General Information:  Caregiver present: No  Diet Prior to this Study: NPO   Temperature : Normal   Respiratory Status: WFL   Supplemental O2: None (Room air)   History of Recent Intubation: No  Behavior/Cognition: Alert; Doesn't follow directions Self-Feeding Abilities: Dependent for feeding Baseline vocal quality/speech: Hypophonia/low volume Volitional Cough: Unable to elicit Volitional Swallow: Unable to elicit Exam Limitations: Poor positioning; Limited visibility Goal Planning: Prognosis for improved oropharyngeal function: Fair Barriers to Reach Goals: Cognitive deficits; Severity of deficits No data recorded Patient/Family Stated Goal: continue to progress Consulted and agree with results and recommendations: Patient Pain: Pain Assessment Pain Assessment: No/denies pain Facial Expression: 0 Body Movements: 0 Muscle Tension: 0 Compliance with ventilator  (intubated pts.): N/A Vocalization (extubated pts.): 0 CPOT Total: 0 Pain Intervention(s): Limited activity within patient's tolerance; Monitored during session; Repositioned End of Session: Start Time:SLP Start Time (ACUTE ONLY): 1159 Stop Time: SLP Stop Time (ACUTE ONLY): 1214 Time Calculation:SLP Time Calculation (min) (ACUTE ONLY): 15 min Charges: SLP Evaluations $ SLP Speech Visit: 1 Visit SLP Evaluations $MBS Swallow: 1 Procedure $Swallowing Treatment: 1 Procedure SLP visit diagnosis: SLP Visit Diagnosis: Dysphagia, pharyngeal phase (R13.13) Past Medical History: Past Medical History: Diagnosis Date  Allergy   Alopecia   Anal cancer (HCC) 08/14/2013  invasive squamous cell ca, s/p radiation 10/20-11/26/14 60.4Gy/54fx and chemo  Anxiety   Aortoiliac occlusive disease (HCC) 08/14/2017  Arthritis   B12 deficiency anemia 09/14/2015  Blood transfusion without reported diagnosis   BPPV (benign paroxysmal positional vertigo) 07/08/2015  Cardiomyopathy (HCC) 11/03/2017  Chronic back pain   Chronic daily headache 03/29/2013  takes bc powder  Closed right hip fracture (HCC) 09/10/2015  Coronary artery disease involving native coronary artery of native heart without angina pectoris 10/13/2017  DES to mid RCA  Depression   Essential (hemorrhagic) thrombocythemia (HCC) 09/06/2018  Essential hypertension 09/25/2023  Family history of early CAD 04/08/2016  GERD (gastroesophageal reflux disease)   History of hiatal hernia   Hot flashes   Hyperlipidemia   Hyperlipidemia associated with type 2 diabetes mellitus (HCC) 05/11/2017  Hypothyroidism 09/10/2015  IBS (irritable bowel syndrome) 03/29/2013  Neck pain 07/08/2015  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  takes neurotin  Peripheral vascular disease 11/03/2017  QT prolongation   Sacroiliitis 09/06/2018  Spasms of the hands or feet 10/08/2017  Syncope 06/07/2016  Tubular adenoma of colon 09/08/2003  Vertigo   Wears glasses  Past Surgical History: Past Surgical History: Procedure  Laterality Date  ABDOMINAL AORTOGRAM N/A 08/02/2017  Procedure: ABDOMINAL AORTOGRAM;  Surgeon: Darron Deatrice LABOR, MD;  [facility];  Service: Cardiovascular;  Laterality: N/A;  ABDOMINAL AORTOGRAM W/LOWER EXTREMITY Bilateral 09/03/2024  Procedure: ABDOMINAL AORTOGRAM W/LOWER EXTREMITY;  Surgeon: Serene Gaile ORN, MD;  [facility];  Service: Cardiovascular;  Laterality: Bilateral;  AORTA - BILATERAL FEMORAL ARTERY BYPASS GRAFT N/A 08/14/2017  Procedure: AORTA BIFEMORAL BYPASS GRAFT;  Surgeon: Oris Krystal FALCON, MD;  Location: [facility] OR;  Service: Vascular;  Laterality: N/A;  COLONOSCOPY    COLONOSCOPY N/A 08/29/2024  Procedure: COLONOSCOPY;  Surgeon: Wilhelmenia Aloha Raddle., MD;  [facility];  Service: Gastroenterology;  Laterality: N/A;  CORONARY STENT INTERVENTION N/A 10/13/2017  Procedure: CORONARY STENT INTERVENTION;  Surgeon: Mady Bruckner, MD;  [facility];  Service: Cardiovascular;  Laterality: N/A;  dental implant    ECTOPIC PREGNANCY SURGERY    EMBOLECTOMY N/A 08/14/2017  Procedure: EMBOLECTOMY FEMORAL;  Surgeon: Oris Krystal FALCON, MD;  [facility];  Service: Vascular;  Laterality: N/A;  FEMORAL-POPLITEAL BYPASS  GRAFT Right 08/14/2017  Procedure: Right Femoral to Above Knee Popliteal Bypass Graft using Non-Reversed Greater Saphenous Vein Graft from Right Leg;  Surgeon: Oris Krystal FALCON, MD;  Location: [facility] OR;  Service: Vascular;  Laterality: Right;  FLEXIBLE SIGMOIDOSCOPY N/A 08/14/2013  Procedure: FLEXIBLE SIGMOIDOSCOPY;  Surgeon: Gwendlyn ONEIDA Buddy, MD;  [facility];  Service: Endoscopy;  Laterality: N/A;  LAPAROTOMY N/A 10/24/2024  Procedure: EXPLORATORY LAPAROTOMY, ABDOMINAL PACKING AND WOUND VAC APPLICATION;  Surgeon: Debby Hila, MD;  Location: [facility];  Service: General;  Laterality: N/A;  (8) EIGHT 18X18 LAP SPONGE PACKED  LAPAROTOMY N/A 10/25/2024  Procedure: LAPAROTOMY, EXPLORATORY;  Surgeon: Rubin Calamity, MD;  Location: [facility];  Service:  General;  Laterality: N/A;  REMOVAL OF PACKING  LEFT HEART CATH AND CORONARY ANGIOGRAPHY N/A 10/13/2017  Procedure: LEFT HEART CATH AND CORONARY ANGIOGRAPHY;  Surgeon: Mady Bruckner, MD;  [facility];  Service: Cardiovascular;  Laterality: N/A;  LOWER EXTREMITY ANGIOGRAPHY Bilateral 08/02/2017  Procedure: Lower Extremity Angiography;  Surgeon: Darron Deatrice LABOR, MD;  Location: [facility] INVASIVE CV LAB;  Service: Cardiovascular;  Laterality: Bilateral;  LOWER EXTREMITY INTERVENTION N/A 09/03/2024  Procedure: LOWER EXTREMITY INTERVENTION;  Surgeon: Serene Gaile ORN, MD;  [facility];  Service: Cardiovascular;  Laterality: N/A;  MULTIPLE TOOTH EXTRACTIONS    PERIPHERAL VASCULAR INTERVENTION  05/22/2018  Procedure: PERIPHERAL VASCULAR INTERVENTION;  Surgeon: Serene Gaile ORN, MD;  [facility];  Service: Cardiovascular;;  SMA and Celiac  PILONIDAL CYST EXCISION    POLYPECTOMY    THROMBECTOMY FEMORAL ARTERY Right 08/14/2017  Procedure: THROMBECTOMY FEMORAL ARTERY;  Surgeon: Oris Krystal FALCON, MD;  Location: [facility] OR;  Service: Vascular;  Laterality: Right;  TONSILLECTOMY    TOTAL HIP ARTHROPLASTY  09/11/2015  Procedure: TOTAL HIP ARTHROPLASTY;  Surgeon: Evalene JONETTA Chancy, MD;  [facility];  Service: Orthopedics;;  ULTRASOUND GUIDANCE FOR VASCULAR ACCESS  10/13/2017  Procedure: Ultrasound Guidance For Vascular Access;  Surgeon: Mady Bruckner, MD;  [facility];  Service: Cardiovascular;;  VENTRAL HERNIA REPAIR N/A 10/23/2024  Procedure: REPAIR, HERNIA, VENTRAL, LAPAROSCOPIC;  Surgeon: Rubin Calamity, MD;  [facility];  Service: General;  Laterality: N/A;  VISCERAL ANGIOGRAPHY N/A 03/03/2020  Procedure: MESTENRIC ANGIOGRAPHY;  Surgeon: Serene Gaile ORN, MD;  [facility];  Service: Cardiovascular;  Laterality: N/A;  VISCERAL ANGIOGRAPHY N/A 09/03/2024  Procedure: VISCERAL ANGIOGRAPHY;  Surgeon: Serene Gaile ORN, MD;  [facility];  Service: Cardiovascular;  Laterality: N/A;  VISCERAL ARTERY INTERVENTION N/A 09/03/2024  Procedure: VISCERAL ARTERY INTERVENTION;  Surgeon: Serene Gaile ORN, MD;  [facility];  Service: Cardiovascular;  Laterality: N/A; Rosina LABOR Ellin 11/06/2024, 2:59 PM  VAS US  LOWER EXTREMITY VENOUS (DVT) Result Date: 11/01/2024  Lower Venous DVT Study Patient Name:  Erin [name]  Date of Exam:   11/01/2024 Medical Rec #: [id number]          Accession #:    [id number] Date of Birth: [birth year]          Patient Gender: F Patient Age:   6 years Exam Location:  [facility] Procedure:      VAS US  LOWER EXTREMITY VENOUS (DVT) Referring Phys: TORIBIO SHARPS --------------------------------------------------------------------------------  Indications: Edema.  Risk Factors: Critically ill patient - immobilization Surgery laparoscopic hernia repair 10/23/2024, exploratory laparotomy 10/24/2024 & 10/25/2024. Limitations: Poor ultrasound/tissue interface and shadowing. Comparison Study: Previous exam on 11/28/2013 was negative for DVT Performing Technologist: [facility] RVT,  RDMS  Examination Guidelines: A complete evaluation includes B-mode imaging, spectral Doppler, color Doppler, and power Doppler as needed of all accessible portions of each vessel. Bilateral testing is considered an integral part of a complete examination. Limited examinations for reoccurring indications may be performed as noted. The reflux portion of the exam is performed with the patient in reverse Trendelenburg.  +---------+---------------+---------+-----------+----------+-------------------+ RIGHT    CompressibilityPhasicitySpontaneityPropertiesThrombus Aging      +---------+---------------+---------+-----------+----------+-------------------+ CFV      Full           Yes      Yes                                      +---------+---------------+---------+-----------+----------+-------------------+ SFJ                                                    unable to visualize +---------+---------------+---------+-----------+----------+-------------------+ FV Prox  Full           Yes      Yes                                      +---------+---------------+---------+-----------+----------+-------------------+ FV Mid   Full           Yes      Yes                                      +---------+---------------+---------+-----------+----------+-------------------+ FV DistalFull           Yes      Yes                                      +---------+---------------+---------+-----------+----------+-------------------+ PFV      Full                                                             +---------+---------------+---------+-----------+----------+-------------------+ POP      Full           Yes      Yes                                      +---------+---------------+---------+-----------+----------+-------------------+ PTV      Full                                                             +---------+---------------+---------+-----------+----------+-------------------+ PERO     Full                                                             +---------+---------------+---------+-----------+----------+-------------------+   +---------+---------------+---------+-----------+----------+-------------------+  LEFT     CompressibilityPhasicitySpontaneityPropertiesThrombus Aging      +---------+---------------+---------+-----------+----------+-------------------+ CFV      Full           Yes      Yes                                      +---------+---------------+---------+-----------+----------+-------------------+ SFJ                                                   unable to visualize +---------+---------------+---------+-----------+----------+-------------------+ FV Prox  Full           Yes      Yes                                       +---------+---------------+---------+-----------+----------+-------------------+ FV Mid   Full           Yes      Yes                                      +---------+---------------+---------+-----------+----------+-------------------+ FV DistalFull           Yes      Yes                                      +---------+---------------+---------+-----------+----------+-------------------+ PFV      Full                                                             +---------+---------------+---------+-----------+----------+-------------------+ POP      Full           Yes      Yes                                      +---------+---------------+---------+-----------+----------+-------------------+ PTV      Full                                                             +---------+---------------+---------+-----------+----------+-------------------+ PERO     Full                                                             +---------+---------------+---------+-----------+----------+-------------------+     Summary: BILATERAL: - No evidence of deep vein thrombosis seen in the lower extremities, bilaterally. - RIGHT: - A cystic structure is found in  the popliteal fossa (4.47 x 0.72 x 2.74 cm).  LEFT: - No cystic structure found in the popliteal fossa.  *See table(s) above for measurements and observations. Electronically signed by Gaile New MD on 11/01/2024 at 9:01:20 PM.    Final    DG Chest Port 1 View Result Date: 11/01/2024 CLINICAL DATA:  Shortness of breath EXAM: PORTABLE CHEST 1 VIEW COMPARISON:  Six days ago FINDINGS: The heart size and mediastinal contours are within normal limits. Both lungs are clear. The visualized skeletal structures are unremarkable. Nasogastric tube tip seen in proximal stomach. IMPRESSION: No active disease. Electronically Signed   By: Lynwood Landy Raddle M.D.   On: 11/01/2024 08:40   VAS US  CAROTID Result Date: 10/31/2024 Carotid Arterial  Duplex Study Patient Name:  SAFIYYA STOKES  Date of Exam:   10/29/2024 Medical Rec #: [id number]          Accession #:    [id number] Date of Birth: 1956/02/08          Patient Gender: F Patient Age:   4 years Exam Location:  [facility] Procedure:      VAS US  CAROTID Referring Phys: ELIGIO LAV --------------------------------------------------------------------------------  Indications:   CVA. Risk Factors:  Hypertension, hyperlipidemia, coronary artery disease, PAD. Other Factors: Status post hernia repair surgery on 10/23/24                S/P aortobifem bypass, instent SMA and Celiac. Limitations    Today's exam was limited due to Left neck cathether/tape. Performing Technologist: Ricka Sturdivant-Jones RDMS, RVT  Examination Guidelines: A complete evaluation includes B-mode imaging, spectral Doppler, color Doppler, and power Doppler as needed of all accessible portions of each vessel. Bilateral testing is considered an integral part of a complete examination. Limited examinations for reoccurring indications may be performed as noted.  Right Carotid Findings: +----------+--------+--------+--------+------------------+--------+           PSV cm/sEDV cm/sStenosisPlaque DescriptionComments +----------+--------+--------+--------+------------------+--------+ CCA Prox  118     18                                         +----------+--------+--------+--------+------------------+--------+ CCA Distal100     16                                         +----------+--------+--------+--------+------------------+--------+ ICA Prox  179     41      40-59%  heterogenous               +----------+--------+--------+--------+------------------+--------+ ICA Mid   161     32      1-39%                              +----------+--------+--------+--------+------------------+--------+ ICA Distal112     28                                          +----------+--------+--------+--------+------------------+--------+ ECA       139                                                +----------+--------+--------+--------+------------------+--------+ +----------+--------+-------+----------------+-------------------+  PSV cm/sEDV cmsDescribe        Arm Pressure (mmHG) +----------+--------+-------+----------------+-------------------+ Subclavian172            Multiphasic, WNL                    +----------+--------+-------+----------------+-------------------+ +---------+--------+---+--------+--+---------+ VertebralPSV cm/s106EDV cm/s26Antegrade +---------+--------+---+--------+--+---------+  Left Carotid Findings: +----------+--------+--------+--------+------------------+--------+           PSV cm/sEDV cm/sStenosisPlaque DescriptionComments +----------+--------+--------+--------+------------------+--------+ CCA Prox  84      17                                         +----------+--------+--------+--------+------------------+--------+ CCA Distal103     17                                         +----------+--------+--------+--------+------------------+--------+ ICA Prox  114     19      1-39%                              +----------+--------+--------+--------+------------------+--------+ ICA Distal86      23                                         +----------+--------+--------+--------+------------------+--------+ ECA       105                                                +----------+--------+--------+--------+------------------+--------+ +----------+--------+--------+--------+-------------------+           PSV cm/sEDV cm/sDescribeArm Pressure (mmHG) +----------+--------+--------+--------+-------------------+ Subclavian260                                         +----------+--------+--------+--------+-------------------+ +---------+--------+--+--------+--+ VertebralPSV  cm/s95EDV cm/s19 +---------+--------+--+--------+--+   Summary: Right Carotid: Velocities in the right ICA are consistent with a 40-59%                stenosis. Left Carotid: Velocities in the left ICA are consistent with a 1-39% stenosis. Vertebrals:  Bilateral vertebral arteries demonstrate antegrade flow. Subclavians: Normal flow hemodynamics were seen in bilateral subclavian              arteries. *See table(s) above for measurements and observations.  Electronically signed by Eather Popp MD on 10/31/2024 at 8:04:20 AM.    Final    MR ANGIO HEAD WO CONTRAST Result Date: 10/28/2024 EXAM: MR Angiography Head without intravenous Contrast. 10/28/2024 05:58:31 PM TECHNIQUE: Magnetic resonance angiography images of the head without intravenous contrast. Multiplanar 2D and 3D reformatted images are provided for review. COMPARISON: Same day CT head and MRI head. CLINICAL HISTORY: Stroke, follow up. FINDINGS: ANTERIOR CIRCULATION: The visualized distal cervical internal carotid arteries and intracranial internal carotid arteries are patent bilaterally. There is a 2 mm inferiorly directed outpouching along the right supraclinoid ICA which likely reflects an infundibulum at the origin of the posterior communicating artery versus a small aneurysm. No significant stenosis of the anterior cerebral arteries. No significant stenosis of  the middle cerebral arteries. No other aneurysm is identified in the anterior circulation. POSTERIOR CIRCULATION: No significant stenosis of the posterior cerebral arteries. No significant stenosis of the basilar artery. No significant stenosis of the vertebral arteries. No aneurysm. IMPRESSION: 1. No large vessel occlusion. No significant intracranial arterial stenosis. 2. 2 mm inferiorly directed outpouching along the right supraclinoid ICA, most consistent with an infundibulum at the posterior communicating artery origin. A small aneurysm is less likely but not excluded. Electronically  signed by: Donnice Mania MD 10/28/2024 06:40 PM EST RP Workstation: [id number]   MR BRAIN WO CONTRAST Result Date: 10/28/2024 EXAM: MRI BRAIN WITHOUT CONTRAST 10/28/2024 05:58:31 PM TECHNIQUE: Multiplanar multisequence MRI of the head/brain was performed without the administration of intravenous contrast. COMPARISON: Same day CT head. CLINICAL HISTORY: Stroke, follow up. FINDINGS: BRAIN AND VENTRICLES: Numerous scattered areas of restricted diffusion throughout the cerebral hemispheres and cerebellum concerning for infarcts. Dominant lesions noted within the inferior left cerebellum on series 5 image 9 as well as within the inferior aspects of the temporal lobes and right occipital lobe on series 5 image 20. There are lesions throughout multiple vascular territories bilaterally. Associated edema particularly within the right occipital lobe and left temporal lobe with local mass effect and sulcal effacement. There are few scattered areas of susceptibility and T1 hyperintensity which could reflect petechial hemorrhage. Cerebral volume within normal limits for patients age. No hydrocephalus. The sella is unremarkable. Normal flow voids. ORBITS: No acute abnormality. SINUSES AND MASTOIDS: Trace bilateral mastoid effusions. Scattered mucosal thickening in the ethmoid and sphenoid sinuses. BONES AND SOFT TISSUES: Normal marrow signal. Partially visualized enteric tube. No acute soft tissue abnormality. IMPRESSION: 1. 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 2. Associated mild edema, particularly within the right occipital lobe and left temporal lobe. No midline shift. 3. Few scattered areas of susceptibility and T1 hyperintensity, possibly reflecting petechial hemorrhage. Electronically signed by: Donnice Mania MD 10/28/2024 06:22 PM EST RP Workstation: [id number]   ECHOCARDIOGRAM COMPLETE Result Date:  10/28/2024    ECHOCARDIOGRAM REPORT   Patient Name:   Erin [name] Date of Exam: 10/28/2024 Medical Rec #:  [id number]         Height:       63.0 in Accession #:    [id number]        Weight:       118.8 lb Date of Birth:  [birth year]         BSA:          1.550 m Patient Age:    68 years          BP:           106/37 mmHg Patient Gender: F                 HR:           60 bpm. Exam Location:  Inpatient Procedure: 2D Echo (Both Spectral and Color Flow Doppler were utilized during            procedure). Indications:    Stroke  History:        Patient has prior history of Echocardiogram examinations.                 Stroke.  Sonographer:    Norleen Amour Referring Phys: [id number] ASHISH ARORA IMPRESSIONS  1. Left ventricular ejection fraction, by estimation, is 60 to 65%. Left ventricular ejection fraction by  2D MOD biplane is 64.6 %. The left ventricle has normal function. The left ventricle has no regional wall motion abnormalities. Left ventricular diastolic parameters were normal.  2. Right ventricular systolic function is normal. The right ventricular size is normal. There is normal pulmonary artery systolic pressure. The estimated right ventricular systolic pressure is 24.9 mmHg.  3. The mitral valve was not well visualized. No evidence of mitral valve regurgitation.  4. The aortic valve was not well visualized. Aortic valve regurgitation is not visualized.  5. The inferior vena cava is normal in size with greater than 50% respiratory variability, suggesting right atrial pressure of 3 mmHg. Comparison(s): No prior Echocardiogram. FINDINGS  Left Ventricle: Left ventricular ejection fraction, by estimation, is 60 to 65%. Left ventricular ejection fraction by 2D MOD biplane is 64.6 %. The left ventricle has normal function. The left ventricle has no regional wall motion abnormalities. The left ventricular internal cavity size was normal in size. There is no left ventricular hypertrophy. Left ventricular diastolic  parameters were normal. Right Ventricle: The right ventricular size is normal. No increase in right ventricular wall thickness. Right ventricular systolic function is normal. There is normal pulmonary artery systolic pressure. The tricuspid regurgitant velocity is 2.34 m/s, and  with an assumed right atrial pressure of 3 mmHg, the estimated right ventricular systolic pressure is 24.9 mmHg. Left Atrium: Left atrial size was normal in size. Right Atrium: Right atrial size was normal in size. Pericardium: There is no evidence of pericardial effusion. Mitral Valve: The mitral valve was not well visualized. No evidence of mitral valve regurgitation. MV peak gradient, 3.6 mmHg. The mean mitral valve gradient is 1.0 mmHg. Tricuspid Valve: The tricuspid valve is grossly normal. Tricuspid valve regurgitation is trivial. Aortic Valve: The aortic valve was not well visualized. Aortic valve regurgitation is not visualized. Aortic valve mean gradient measures 4.0 mmHg. Aortic valve peak gradient measures 8.2 mmHg. Aortic valve area, by VTI measures 1.54 cm. Pulmonic Valve: The pulmonic valve was normal in structure. Pulmonic valve regurgitation is not visualized. Aorta: The aortic root and ascending aorta are structurally normal, with no evidence of dilitation. Venous: The inferior vena cava is normal in size with greater than 50% respiratory variability, suggesting right atrial pressure of 3 mmHg. IAS/Shunts: No atrial level shunt detected by color flow Doppler.  LEFT VENTRICLE PLAX 2D                        Biplane EF (MOD) LVIDd:         4.30 cm         LV Biplane EF:   Left LVIDs:         2.50 cm                          ventricular LV PW:         0.80 cm                          ejection LV IVS:        0.70 cm                          fraction by LVOT diam:     1.80 cm  2D MOD LV SV:         39                               biplane is LV SV Index:   25                               64.6 %. LVOT  Area:     2.54 cm                                Diastology                                LV e' medial:    7.18 cm/s LV Volumes (MOD)               LV E/e' medial:  14.2 LV vol d, MOD    40.2 ml       LV e' lateral:   9.14 cm/s A2C:                           LV E/e' lateral: 11.2 LV vol d, MOD    47.1 ml A4C: LV vol s, MOD    11.8 ml A2C: LV vol s, MOD    21.0 ml A4C: LV SV MOD A2C:   28.4 ml LV SV MOD A4C:   47.1 ml LV SV MOD BP:    28.9 ml RIGHT VENTRICLE             IVC RV S prime:     11.00 cm/s  IVC diam: 1.20 cm TAPSE (M-mode): 2.2 cm                             PULMONARY VEINS                             Diastolic Velocity: 30.80 cm/s                             S/D Velocity:       1.00                             Systolic Velocity:  30.80 cm/s LEFT ATRIUM             Index        RIGHT ATRIUM          Index LA diam:        2.50 cm 1.61 cm/m   RA Area:     6.54 cm LA Vol (A2C):   24.2 ml 15.61 ml/m  RA Volume:   11.10 ml 7.16 ml/m LA Vol (A4C):   24.0 ml 15.49 ml/m LA Biplane Vol: 24.8 ml 16.00 ml/m  AORTIC VALVE                    PULMONIC VALVE AV Area (Vmax):    1.50 cm     PV Vmax:       0.87 m/s AV Area (Vmean):  1.55 cm     PV Peak grad:  3.0 mmHg AV Area (VTI):     1.54 cm AV Vmax:           143.00 cm/s AV Vmean:          94.300 cm/s AV VTI:            0.256 m AV Peak Grad:      8.2 mmHg AV Mean Grad:      4.0 mmHg LVOT Vmax:         84.20 cm/s LVOT Vmean:        57.300 cm/s LVOT VTI:          0.155 m LVOT/AV VTI ratio: 0.61  AORTA Ao Root diam: 2.30 cm Ao Asc diam:  2.60 cm MITRAL VALVE                TRICUSPID VALVE MV Area (PHT): 3.89 cm     TR Peak grad:   21.9 mmHg MV Area VTI:   1.37 cm     TR Vmax:        234.00 cm/s MV Peak grad:  3.6 mmHg MV Mean grad:  1.0 mmHg     SHUNTS MV Vmax:       0.95 m/s     Systemic VTI:  0.16 m MV Vmean:      56.4 cm/s    Systemic Diam: 1.80 cm MV Decel Time: 195 msec MV E velocity: 102.00 cm/s MV A velocity: 61.30 cm/s MV E/A ratio:  1.66 Vinie Maxcy MD Electronically signed by Vinie Maxcy MD Signature Date/Time: 10/28/2024/3:58:07 PM    Final    EEG adult Result Date: 10/28/2024 Shelton Arlin KIDD, MD     10/28/2024  2:17 PM Patient Name: EMONII WIENKE MRN: [id number] Epilepsy Attending: Arlin KIDD Shelton Referring Physician/Provider: Ilah Corean [name], PA-C Date: 10/28/2024 Duration: 24.28 mins Patient history: 69yo F with ams. EEG to evaluate for seizure Level of alertness: comatose/lethargic AEDs during EEG study: None Technical aspects: This EEG study was done with scalp electrodes positioned according to the 10-20 International system of electrode placement. Electrical activity was reviewed with band pass filter of 1-70Hz , sensitivity of 7 uV/mm, display speed of 80mm/sec with a 60Hz  notched filter applied as appropriate. EEG data were recorded continuously and digitally stored.  Video monitoring was available and reviewed as appropriate. Description: EEG showed continuous generalized polymorphic high amplitude 3 to 5 Hz theta-delta slowing. Hyperventilation and photic stimulation were not performed.   ABNORMALITY - Continuous slow, generalized IMPRESSION: This study is suggestive of generalized cerebral dysfunction ( encephalopathy). No seizures or epileptiform discharges were seen throughout the recording. Priyanka KIDD Shelton   CT HEAD WO CONTRAST ( ) Result Date: 10/28/2024 EXAM: CT HEAD WITHOUT CONTRAST 10/28/2024 10:26:05 AM TECHNIQUE: CT of the head was performed without the administration of intravenous contrast. Automated exposure control, iterative reconstruction, and/or weight based adjustment of the mA/kV was utilized to reduce the radiation dose to as low as reasonably achievable. COMPARISON: 05/03/2024 CLINICAL HISTORY: Mental status change, unknown cause FINDINGS: BRAIN AND VENTRICLES: No acute hemorrhage. No evidence of acute infarct. No hydrocephalus. No extra-axial collection. No mass effect or midline shift. Interval  development of multiple cortical and subcortical edema areas involving bilateral cerebral and cerebellar hemispheres. ORBITS: No acute abnormality. SINUSES: Left frontal sinus mucosal thickening. SOFT TISSUES AND SKULL: No acute soft tissue abnormality. No skull fracture. Partially visualized nasogastric tube in place. IMPRESSION: 1. Interval development of multiple cortical  and subcortical edema areas involving bilateral cerebral and cerebellar hemispheres. These areas are highly suggestive of embolic infarcts. Correlation with MRI of the brain without and with gadolinium contrast is suggested. Electronically signed by: Evalene Coho MD 10/28/2024 10:56 AM EST RP Workstation: [id number]   DG Abd 1 View Result Date: 10/26/2024 EXAM: 1 VIEW XRAY OF THE ABDOMEN 10/26/2024 10:18:00 PM COMPARISON: 10/24/2024 CLINICAL HISTORY: [id number] Encounter for imaging study to confirm nasogastric (NG) tube placement [id number] Encounter for imaging study to confirm nasogastric (NG) tube placement FINDINGS: LINES, TUBES AND DEVICES: NG tube coils in the fundus of the stomach. BOWEL: Nonobstructive bowel gas pattern. SOFT TISSUES: Skin staples noted to the right of midline. No abnormal calcifications. BONES: No acute fracture. IMPRESSION: 1. NG tube tip projects over the fundus of the stomach. 2. Nonobstructive bowel gas pattern. Electronically signed by: Franky Crease MD 10/26/2024 10:23 PM EST RP Workstation: [id number]   DG Chest Port 1 View Result Date: 10/26/2024 EXAM: 1 VIEW(S) XRAY OF THE CHEST 10/26/2024 08:29:42 PM COMPARISON: 10/25/2024 CLINICAL HISTORY: Respiratory failure (HCC) FINDINGS: LINES, TUBES AND DEVICES: Endotracheal tube in place with tip 1 cm above the carina. Enteric tube coiled within the gastric fundus. Stable left IJ approach central venous catheter with tip at the superior cavoatrial junction. LUNGS AND PLEURA: Small right pleural effusion with right basilar opacities, not significantly changed. No  pneumothorax. HEART AND MEDIASTINUM: Atherosclerotic plaque. No acute abnormality of the cardiac and mediastinal silhouettes. BONES AND SOFT TISSUES: Upper abdominal surgical staples noted. No acute osseous abnormality. IMPRESSION: 1. Small right pleural effusion with persistent right basilar opacities, unchanged. 2. Enteric tube coiled within the stomach. Consider retracting by 3cm. Electronically signed by: Morgane Naveau MD 10/26/2024 08:34 PM EST RP Workstation: [id number]     TODAY-DAY OF DISCHARGE:  Subjective:   Erin [name] today has no headache,no chest abdominal pain,no new weakness tingling or numbness, feels much better wants to go home today.   Objective:   Blood pressure 138/65, pulse 80, temperature 98.2 F (36.8 C), temperature source Oral, resp. rate 14, height 5' 3 (1.6 m), weight 43.3 kg, SpO2 98%.  Intake/Output Summary (Last 24 hours) at 11/25/2024 1106 Last data filed at 11/25/2024 [id number] Gross per 24 hour  Intake 1037 ml  Output --  Net 1037 ml   Filed Weights   11/22/24 0500 11/23/24 0500 11/25/24 0500  Weight: 43.9 kg 45.3 kg 43.3 kg    Exam: Awake Alert, Oriented *3, No new F.N deficits, Normal affect Sarita.AT,PERRAL Supple Neck,No JVD, No cervical lymphadenopathy appriciated.  Symmetrical Chest wall movement, Good air movement bilaterally, CTAB RRR,No Gallops,Rubs or new Murmurs, No Parasternal Heave +ve B.Sounds, Abd Soft, Non tender, No organomegaly appriciated, No rebound -guarding or rigidity. No Cyanosis, Clubbing or edema, No new Rash or bruise   PERTINENT RADIOLOGIC STUDIES: No results found.   PERTINENT LAB RESULTS: CBC: No results for input(s): WBC, HGB, HCT, PLT in the last 72 hours. CMET CMP     Component Value Date/Time   NA 135 11/20/2024 0356   NA 137 07/01/2024 1505   NA 139 04/22/2014 1028   K 3.8 11/20/2024 0356   K 4.1 04/22/2014 1028   CL 100 11/20/2024 0356   CO2 23 11/20/2024 0356   CO2 23 04/22/2014 1028    GLUCOSE 108 (H) 11/20/2024 0356   GLUCOSE 81 04/22/2014 1028   BUN 42 (H) 11/20/2024 0356   BUN 20 07/01/2024 1505   BUN 8.8 04/22/2014 1028   CREATININE 1.56 (H) 11/20/2024  0356   CREATININE 0.94 12/10/2021 1532   CREATININE 0.8 04/22/2014 1028   CALCIUM  9.5 11/20/2024 0356   CALCIUM  9.1 04/22/2014 1028   PROT 6.3 (L) 11/13/2024 0155   PROT 6.6 05/23/2022 1438   PROT 6.2 (L) 11/28/2013 1148   ALBUMIN  3.6 11/13/2024 0155   ALBUMIN  4.4 05/23/2022 1438   ALBUMIN  3.1 (L) 11/28/2013 1148   AST 22 11/13/2024 0155   AST 11 11/28/2013 1148   ALT 13 11/13/2024 0155   ALT 9 11/28/2013 1148   ALKPHOS 91 11/13/2024 0155   ALKPHOS 80 11/28/2013 1148   BILITOT 0.2 11/13/2024 0155   BILITOT 0.2 05/23/2022 1438   BILITOT 0.27 11/28/2013 1148   GFR 54.40 (L) 01/23/2024 1428   EGFR 38 (L) 07/01/2024 1505   GFRNONAA 36 (L) 11/20/2024 0356    GFR Estimated Creatinine Clearance: 23.6 mL/min (A) (by C-G formula based on SCr of 1.56 mg/dL (H)). No results for input(s): LIPASE, AMYLASE in the last 72 hours. No results for input(s): CKTOTAL, CKMB, CKMBINDEX, TROPONINI in the last 72 hours. Invalid input(s): POCBNP No results for input(s): DDIMER in the last 72 hours. No results for input(s): HGBA1C in the last 72 hours. No results for input(s): CHOL, HDL, LDLCALC, TRIG, CHOLHDL, LDLDIRECT in the last 72 hours. No results for input(s): TSH, T4TOTAL, T3FREE, THYROIDAB in the last 72 hours.  Invalid input(s): FREET3 No results for input(s): VITAMINB12, FOLATE, FERRITIN, TIBC, IRON, RETICCTPCT in the last 72 hours. Coags: No results for input(s): INR in the last 72 hours.  Invalid input(s): PT Microbiology: No results found for this or any previous visit (from the past 240 hours).  FURTHER DISCHARGE INSTRUCTIONS:  Get Medicines reviewed and adjusted: Please take all your medications with you for your next visit with your Primary  MD  Laboratory/radiological data: Please request your Primary MD to go over all hospital tests and procedure/radiological results at the follow up, please ask your Primary MD to get all Hospital records sent to his/her office.  In some cases, they will be blood work, cultures and biopsy results pending at the time of your discharge. Please request that your primary care M.D. goes through all the records of your hospital data and follows up on these results.  Also Note the following: If you experience worsening of your admission symptoms, develop shortness of breath, life threatening emergency, suicidal or homicidal thoughts you must seek medical attention immediately by calling 911 or calling your MD immediately  if symptoms less severe.  You must read complete instructions/literature along with all the possible adverse reactions/side effects for all the Medicines you take and that have been prescribed to you. Take any new Medicines after you have completely understood and accpet all the possible adverse reactions/side effects.   Do not drive when taking Pain medications or sleeping medications (Benzodaizepines)  Do not take more than prescribed Pain, Sleep and Anxiety Medications. It is not advisable to combine anxiety,sleep and pain medications without talking with your primary care practitioner  Special Instructions: If you have smoked or chewed Tobacco  in the last 2 yrs please stop smoking, stop any regular Alcohol  and or any Recreational drug use.  Wear Seat belts while driving.  Please note: You were cared for by a hospitalist during your hospital stay. Once you are discharged, your primary care physician will handle any further medical issues. Please note that NO REFILLS for any discharge medications will be authorized once you are discharged, as it is imperative that  you return to your primary care physician (or establish a relationship with a primary care physician if you do not have  one) for your post hospital discharge needs so that they can reassess your need for medications and monitor your lab values.  Total Time spent coordinating discharge including counseling, education and face to face time equals greater than 30 minutes.  Signed: Donalda Applebaum 11/25/2024 11:06 AM      [1]  Allergies Allergen Reactions   Latex Itching    Must use paper tape   Amitiza  [Lubiprostone ] Diarrhea    Uncontrollable diarrhea   Nortriptyline  Other (See Comments)    Stomach distention   Sulfa Antibiotics Nausea And Vomiting and Other (See Comments)    GI distress/pain   Tramadol  Itching and Rash   Atorvastatin  Nausea And Vomiting   Claritin [Loratadine] Other (See Comments)    Hot flashes   "

## 2024-11-25 NOTE — H&P (Signed)
 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 The patient presented to [facility] on 10/23/2024 for planned robotic hernia repair by Dr. Redmond. Postoperatively she developed hypotension and acute blood loss anemia. CCM consulted for hemorrhagic shock in the setting of hemoperitoneum and requiring intubation and press and transfer to ICU. On 12/4 she returned back to the OR where no active bleeding was identified but the abdomen was packed and wound VAC placed. CT angio abdomen/pelvis: Large right intraperitoneal hematoma with active bleeding from the right epigastric artery, left renal artery occluded at the origin with minimal enhancement. Severe narrowing of the SMA immediately distal to the stent which is patent.  On 12/5 she returned to OR where there was no evidence of bleeding and abdomen was closed.   She was transitioned off of sedation but remained encephalopathic. On 12/8 Neurology consulted and patient underwent STAT CT head that revealed interval development of multiple cortical and subcortical edema areas involving bilateral cerebral and cerebellar hemispheres. MRI showed evidence of bilateral strokes with scattered areas of restricted diffusion throughout the cerebral hemispheres and cerebellum concerning for acute infarcts of embolic etiology. An EEG showed continuous slow, generalized. The study is suggestive of generalized cerebral dysfunction ( encephalopathy). No seizures or epileptiform discharges were seen throughout the recording.  Etiology likely profound intracranial hypoperfusion although some initial concern for embolic due to DIC, ultimately ruled out.  Neuro  recommended aspirin  81 mg daily. EF 60-65%, carotid doppler without significant stenosis. She was transferred to [facility] ICU on 10/29/24, stabilized in the ICU and extubated on 12/11 an subsequently transferred to [facility] on 12/15. Hospital course complicated by AKI on CKD, Enterobacter cloacae HCAP, dysphagia and malnutrition requiring PEC placement on 12/30, anemia, poorly controlled pain, and neurogenic bowel/bladder.   Per chart review, patient lives in a private home with 24/7 assistance from her spouse who is physically capable of assisting her (recently cared for their parents during hospice transition), single level with one-step to enter with grab bars near the tub/shower and toilet. Prior to admission, she was independent of mobility with no assistive device other community ambulation was limited at times. She was independent of ADLs and IADLs, but got assistance from her spouse from grocery shopping. Currently, she is min assist for bed mobility, min assist for sit to stand transfers and min assist +2 for gait up to 15 feet requiring frequent cueing and limited by fatigue. MBS 12-22 showed mild oropharyngeal dysphagia, she was advanced to a regular diet on 12-31 with transition to nocturnal tube feeds to support nutrition; she is eating 25 to 100% of meals.    Review of Systems  Constitutional:  Positive for malaise/fatigue and weight loss.  HENT: Negative.    Eyes: Negative.   Respiratory:  Positive for shortness of breath.   Cardiovascular: Negative.   Gastrointestinal:  Positive for nausea.  Genitourinary:        Has purewick  Musculoskeletal: Negative.   Skin: Negative.   Neurological:  Positive for weakness.  Psychiatric/Behavioral:  The patient is nervous/anxious.         Past Medical History:  Diagnosis Date   Allergy    Alopecia    Anal cancer (  HCC) 08/14/2013   invasive squamous cell ca, s/p radiation 10/20-11/26/14 60.4Gy/37fx and chemo   Anxiety     Aortoiliac occlusive disease (HCC) 08/14/2017   Arthritis    B12 deficiency anemia 09/14/2015   Blood transfusion without reported diagnosis    BPPV (benign paroxysmal positional vertigo) 07/08/2015   Cardiomyopathy (HCC) 11/03/2017   Chronic back pain    Chronic daily headache 03/29/2013   takes bc powder   Closed right hip fracture (HCC) 09/10/2015   Coronary artery disease involving native coronary artery of native heart without angina pectoris 10/13/2017   DES to mid RCA   Depression    Essential (hemorrhagic) thrombocythemia (HCC) 09/06/2018   Essential hypertension 09/25/2023   Family history of early CAD 04/08/2016   GERD (gastroesophageal reflux disease)    History of hiatal hernia    Hot flashes    Hyperlipidemia    Hyperlipidemia associated with type 2 diabetes mellitus (HCC) 05/11/2017   Hypothyroidism 09/10/2015   IBS (irritable bowel syndrome) 03/29/2013   Neck pain 07/08/2015   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   takes neurotin   Peripheral vascular disease 11/03/2017   QT prolongation    Sacroiliitis 09/06/2018   Spasms of the hands or feet 10/08/2017   Syncope 06/07/2016   Tubular adenoma of colon 09/08/2003   Vertigo    Wears glasses    Past Surgical History:  Procedure Laterality Date   ABDOMINAL AORTOGRAM N/A 08/02/2017   Procedure: ABDOMINAL AORTOGRAM;  Surgeon: Darron Deatrice LABOR, MD;  Location: [facility];  Service: Cardiovascular;  Laterality: N/A;   ABDOMINAL AORTOGRAM W/LOWER EXTREMITY Bilateral 09/03/2024   Procedure: ABDOMINAL AORTOGRAM W/LOWER EXTREMITY;  Surgeon: Serene Gaile ORN, MD;  Location: [facility];  Service: Cardiovascular;  Laterality: Bilateral;   AORTA - BILATERAL FEMORAL ARTERY BYPASS GRAFT N/A 08/14/2017   Procedure: AORTA BIFEMORAL BYPASS GRAFT;  Surgeon: Oris Krystal FALCON, MD;  Location: [facility] OR;  Service: Vascular;  Laterality: N/A;   COLONOSCOPY     COLONOSCOPY N/A 08/29/2024   Procedure: COLONOSCOPY;  Surgeon: Wilhelmenia Aloha Raddle., MD;  Location: [facility] ENDOS[facility];  Service: Gastroenterology;  Laterality: N/A;   CORONARY STENT INTERVENTION N/A 10/13/2017   Procedure: CORONARY STENT INTERVENTION;  Surgeon: Mady Bruckner, MD;  Location: [facility];  Service: Cardiovascular;  Laterality: N/A;   dental implant     ECTOPIC PREGNANCY SURGERY     EMBOLECTOMY N/A 08/14/2017   Procedure: EMBOLECTOMY FEMORAL;  Surgeon: Oris Krystal FALCON, MD;  Location: [facility] OR;  Service: Vascular;  Laterality: N/A;   FEMORAL-POPLITEAL BYPASS GRAFT Right 08/14/2017   Procedure: Right Femoral to Above Knee Popliteal Bypass Graft using Non-Reversed Greater Saphenous Vein Graft from Right Leg;  Surgeon: Oris Krystal FALCON, MD;  Location: [facility] OR;  Service: Vascular;  Laterality: Right;   FLEXIBLE SIGMOIDOSCOPY N/A 08/14/2013   Procedure: FLEXIBLE SIGMOIDOSCOPY;  Surgeon: Gwendlyn ONEIDA Buddy, MD;  Location: [facility] ENDOSCOPY;  Service: Endoscopy;  Laterality: N/A;   IR GASTROSTOMY TUBE MOD SED  11/19/2024   LAPAROTOMY N/A 10/24/2024   Procedure: EXPLORATORY LAPAROTOMY, ABDOMINAL PACKING AND WOUND VAC APPLICATION;  Surgeon: Debby Hila, MD;  Location: [facility] ORS;  Service: General;  Laterality: N/A;  (8) EIGHT 18X18 LAP SPONGE PACKED   LAPAROTOMY N/A 10/25/2024   Procedure: LAPAROTOMY, EXPLORATORY;  Surgeon: Rubin Calamity, MD;  Location: [facility] ORS;  Service: General;  Laterality: N/A;  REMOVAL OF PACKING   LEFT HEART CATH AND CORONARY ANGIOGRAPHY N/A 10/13/2017   Procedure: LEFT HEART CATH AND CORONARY ANGIOGRAPHY;  Surgeon: Mady Bruckner, MD;  Location: [facility];  Service: Cardiovascular;  Laterality: N/A;   LOWER EXTREMITY ANGIOGRAPHY Bilateral 08/02/2017   Procedure: Lower Extremity Angiography;  Surgeon: Darron Deatrice LABOR, MD;  Location: [facility] INVASIVE CV LAB;  Service: Cardiovascular;  Laterality: Bilateral;   LOWER EXTREMITY INTERVENTION N/A 09/03/2024   Procedure: LOWER EXTREMITY INTERVENTION;  Surgeon: Serene Gaile ORN, MD;  [facility]: [facility];  Service: Cardiovascular;  Laterality: N/A;   MULTIPLE TOOTH EXTRACTIONS     PERIPHERAL VASCULAR INTERVENTION  05/22/2018   Procedure: PERIPHERAL VASCULAR INTERVENTION;  Surgeon: Serene Gaile ORN, MD;  Location: [facility];  Service: Cardiovascular;;  SMA and Celiac   PILONIDAL CYST EXCISION     POLYPECTOMY     THROMBECTOMY FEMORAL ARTERY Right 08/14/2017   Procedure: THROMBECTOMY FEMORAL ARTERY;  Surgeon: Oris Krystal FALCON, MD;  Location: [facility] OR;  Service: Vascular;  Laterality: Right;   TONSILLECTOMY     TOTAL HIP ARTHROPLASTY  09/11/2015   Procedure: TOTAL HIP ARTHROPLASTY;  Surgeon: Evalene JONETTA Chancy, MD;  Location: [facility] OR;  Service: Orthopedics;;   ULTRASOUND GUIDANCE FOR VASCULAR ACCESS  10/13/2017   Procedure: Ultrasound Guidance For Vascular Access;  Surgeon: Mady Bruckner, MD;  Location: [facility];  Service: Cardiovascular;;   VENTRAL HERNIA REPAIR N/A 10/23/2024   Procedure: REPAIR, HERNIA, VENTRAL, LAPAROSCOPIC;  Surgeon: Rubin Calamity, MD;  Location: [facility] ORS;  Service: General;  Laterality: N/A;   VISCERAL ANGIOGRAPHY N/A 03/03/2020   Procedure: MESTENRIC ANGIOGRAPHY;  Surgeon: Serene Gaile ORN, MD;  Location: [facility];  Service: Cardiovascular;  Laterality: N/A;   VISCERAL ANGIOGRAPHY N/A 09/03/2024   Procedure: VISCERAL ANGIOGRAPHY;  Surgeon: Serene Gaile ORN, MD;  Location: [facility];  Service: Cardiovascular;  Laterality: N/A;   VISCERAL ARTERY INTERVENTION N/A 09/03/2024   Procedure: VISCERAL ARTERY INTERVENTION;  Surgeon: Serene Gaile ORN, MD;  Location: [facility];  Service: Cardiovascular;  Laterality: N/A;   Family History  Problem Relation Age of Onset   Dementia Mother    Arthritis Mother    Hyperlipidemia Mother    Heart disease Mother    Hypertension Mother    Stroke Mother 49   Irritable bowel syndrome Mother    Thyroid  disease Mother    Heart attack Mother    Heart disease Father    Hyperlipidemia Father     Hypertension Father    Stroke Father 60   Thyroid  disease Father    Prostate cancer Father    Heart attack Father    Lung cancer Brother 4   Heart attack Maternal Uncle    Heart attack Maternal Grandfather    Colon cancer Neg Hx    Rectal cancer Neg Hx    Stomach cancer Neg Hx    Esophageal cancer Neg Hx    Social History:  reports that she quit smoking about 10 years ago. Her smoking use included cigarettes. She smoked an average of 0.5 packs per day. She has never used smokeless tobacco. She reports that she does not drink alcohol and does not use drugs. Allergies: Allergies[1] Medications Prior to Admission  Medication Sig Dispense Refill   acetaminophen  (TYLENOL ) 500 MG tablet Take 1,000 mg by mouth every 6 (six) hours as needed for headache (pain).     acyclovir  ointment (ZOVIRAX ) 5 % Apply 1 Application topically every 3 (three) hours as needed (fever blisters). 15 g 3   amLODipine  (NORVASC ) 5 MG tablet Take 10 mg  by mouth daily.     aspirin  EC 81 MG tablet Take 1 tablet (81 mg total) by mouth daily. Swallow whole.     carvedilol  (COREG ) 12.5 MG tablet Take 12.5 mg by mouth 2 (two) times daily.     Cholecalciferol (VITAMIN D ) 2000 units tablet Take 2,000 Units by mouth daily.     clopidogrel  (PLAVIX ) 75 MG tablet Take 1 tablet (75 mg total) by mouth daily. 30 tablet 11   DULoxetine  (CYMBALTA ) 20 MG capsule TAKE 2-3 CAPSULES BY MOUTH EVERY DAY 270 capsule 1   fluticasone  (FLONASE ) 50 MCG/ACT nasal spray SPRAY 1 SPRAY INTO EACH NOSTRIL DAILY 96 mL 1   gabapentin  (NEURONTIN ) 600 MG tablet TAKE 1 TABLET BY MOUTH THREE TIMES A DAY 90 tablet 3   ketoconazole  (NIZORAL ) 2 % shampoo Apply to hair daily for 7 days -- leave on for 5 minutes, then wash every other day for 7 days, then wash twice a week for 7 days,  then once a week. 120 mL 5   levothyroxine  (SYNTHROID ) 75 MCG tablet TAKE 1 TABLET BY MOUTH DAILY BEFORE BREAKFAST MONDAY-FRIDAY, THEN 1/2 SATURDAY AND SUNDAY 84 tablet 0    metoCLOPramide  (REGLAN ) 5 MG tablet TAKE 1 TABLET BY MOUTH EVERY 6 HOURS AS NEEDED FOR NAUSEA. 60 tablet 0   nitroGLYCERIN  (NITROSTAT ) 0.4 MG SL tablet Place 1 tablet (0.4 mg total) under the tongue every 5 (five) minutes as needed for chest pain. 25 tablet 3   nystatin -triamcinolone  (MYCOLOG II) cream Apply 1 application topically 2 (two) times daily as needed (dry skin).   2   pantoprazole  (PROTONIX ) 40 MG tablet TAKE 1 TABLET BY MOUTH TWICE A DAY 180 tablet 3   potassium chloride  SA (KLOR-CON  M20) 20 MEQ tablet Take 1 tablet (20 mEq total) by mouth 2 (two) times daily. 180 tablet 3   rOPINIRole  (REQUIP ) 1 MG tablet Take 1 tablet (1 mg total) by mouth at bedtime. May Increase to 2 tablet at bedtime after 7 days if needed. 180 tablet 1   rosuvastatin  (CRESTOR ) 40 MG tablet Take 1 tablet (40 mg total) by mouth daily. 90 tablet 1   tiZANidine (ZANAFLEX) 4 MG tablet Take 2 mg by mouth 3 (three) times daily.     topiramate  (TOPAMAX ) 50 MG tablet Take 50 mg by mouth 2 (two) times daily.     vitamin B-12 1000 MCG tablet Take 1 tablet (1,000 mcg total) by mouth daily. 30 tablet 0   hydrALAZINE  (APRESOLINE ) 25 MG tablet Take 1 tab once daily as needed for elevated BP (>160/95)     meclizine  (ANTIVERT ) 25 MG tablet TAKE 1 TABLET BY MOUTH THREE TIMES A DAY AS NEEDED FOR DIZZINESS 90 tablet 1      Home: Home Living Family/patient expects to be discharged to:: Private residence Living Arrangements: Spouse/significant other Available Help at Discharge: Family, Available 24 hours/day Type of Home: House Home Access: Stairs to enter Secretary/administrator of Steps: 1 Home Layout: One level Bathroom Shower/Tub: Health Visitor: Handicapped height Bathroom Accessibility: Yes Home Equipment: Grab bars - tub/shower, Grab bars - toilet, Hand held shower head, Shower seat - built in, BSC/3in1, Agricultural Consultant (2 wheels), [employer] - single point, Wheelchair - manual Additional Comments: 1 cat and 1  dog at home  Lives With: Spouse   Functional History: Prior Function Prior Level of Function : Independent/Modified Independent, Driving Mobility Comments: no AD, tolerance for community mobility limited as times per spouse ADLs Comments: retired, ind ADLs, IADLS,  meds; spouse completes grocery shopping  Functional Status:  Mobility: Bed Mobility Overal bed mobility: Needs Assistance Bed Mobility: Supine to Sit, Sit to Supine Rolling: Modified independent (Device/Increase time) Sidelying to sit: Max assist, Used rails Supine to sit: Contact guard, Used rails Sit to supine: Contact guard assist Sit to sidelying: Mod assist General bed mobility comments: cues for sequencing and technique. increased time and CGA for safety Transfers Overall transfer level: Needs assistance Equipment used: Rolling walker (2 wheels) Transfers: Sit to/from Stand Sit to Stand: Min assist Bed to/from chair/wheelchair/BSC transfer type:: Step pivot Squat pivot transfers: Max assist Step pivot transfers: Min assist, +2 physical assistance Transfer via Lift Equipment: Stedy General transfer comment: From EOB x2 with min A for rise and cues for hand placement Ambulation/Gait Ambulation/Gait assistance: Min assist, +2 safety/equipment Gait Distance (Feet): 50 Feet (+75) Assistive device: Rolling walker (2 wheels) Gait Pattern/deviations: Narrow base of support, Trunk flexed, Step-to pattern, Decreased stride length, Ataxic, Step-through pattern General Gait Details: Pt initially demonstrating slow steps with cues for sequencing progressing to improved fluidity and step-through pattern. Min A for stability and intermittent RW management. Ataxia noted with decreased awareness of foot placement and narrow BOS Gait velocity interpretation: <1.31 ft/sec, indicative of household ambulator    ADL: ADL Overall ADL's : Needs assistance/impaired Eating/Feeding: Set up, Sitting Grooming: Brushing hair, Sitting,  Contact guard assist, Wash/dry face Grooming Details (indicate cue type and reason): min-mod A for sitting balance while pt brushing hair; needed ~1 minute to initiate Upper Body Bathing: Contact guard assist, Sitting Lower Body Bathing: Moderate assistance, Sit to/from stand Upper Body Dressing : Contact guard assist, Sitting Lower Body Dressing: Moderate assistance, Sit to/from stand Toilet Transfer: Minimal assistance, Ambulation, Regular Toilet, Rolling walker (2 wheels) Functional mobility during ADLs: Total assistance, +2 for physical assistance General ADL Comments: total care  Cognition: Cognition Overall Cognitive Status: Impaired/Different from baseline Arousal/Alertness: Awake/alert Orientation Level: Oriented to person, Oriented to place, Disoriented to time, Disoriented to situation Attention: Focused Focused Attention: Impaired Focused Attention Impairment: Functional basic Cognition Arousal: Alert Behavior During Therapy: WFL for tasks assessed/performed Overall Cognitive Status: Impaired/Different from baseline  Physical Exam: Blood pressure 138/65, pulse 80, temperature 98.2 F (36.8 C), temperature source Oral, resp. rate 14, height 5' 3 (1.6 m), weight 43.3 kg, SpO2 98%. Physical Exam Constitutional:      General: She is not in acute distress.    Appearance: She is ill-appearing.  HENT:     Right Ear: External ear normal.     Left Ear: External ear normal.     Nose: Nose normal.     Mouth/Throat:     Pharynx: Oropharynx is clear.  Eyes:     Conjunctiva/sclera: Conjunctivae normal.  Cardiovascular:     Rate and Rhythm: Normal rate and regular rhythm.     Heart sounds: No murmur heard.    No gallop.  Pulmonary:     Effort: Pulmonary effort is normal. No respiratory distress.     Breath sounds: No wheezing.  Abdominal:     Palpations: Abdomen is soft.     Tenderness: There is abdominal tenderness.     Comments: PEG site clean and intact, sl tender to  palpation  Musculoskeletal:     Cervical back: Normal range of motion.     Comments: Mild posterior neck discomfort with palpation. As functional ROM of neck  Skin:    General: Skin is warm.  Neurological:     Comments: Alert and oriented to person,  place, month. Needed help with year, and could not remember day. Was able to recall her address. Fair insight and awareness. Intact Memory. Normal language and speech. Cranial nerve exam unremarkable. MMT: BUE 4/5 prox to distal. BLE 3/5 HF, 4/5 KE and ADF/PF. Sensory exam normal for light touch and pain in all 4 limbs. No limb ataxia or cerebellar signs. No abnormal tone appreciated.  SABRA    Psychiatric:        Mood and Affect: Mood normal.        Behavior: Behavior normal.     Results for orders placed or performed during the hospital encounter of 10/23/24 (from the past 48 hours)  Glucose, capillary     Status: Abnormal   Collection Time: 11/23/24 11:29 AM  Result Value Ref Range   Glucose-Capillary 141 (H) 70 - 99 mg/dL    Comment: Glucose reference range applies only to samples taken after fasting for at least 8 hours.  Glucose, capillary     Status: Abnormal   Collection Time: 11/23/24  3:22 PM  Result Value Ref Range   Glucose-Capillary 129 (H) 70 - 99 mg/dL    Comment: Glucose reference range applies only to samples taken after fasting for at least 8 hours.  Glucose, capillary     Status: Abnormal   Collection Time: 11/23/24  7:58 PM  Result Value Ref Range   Glucose-Capillary 103 (H) 70 - 99 mg/dL    Comment: Glucose reference range applies only to samples taken after fasting for at least 8 hours.  Glucose, capillary     Status: Abnormal   Collection Time: 11/23/24 11:24 PM  Result Value Ref Range   Glucose-Capillary 142 (H) 70 - 99 mg/dL    Comment: Glucose reference range applies only to samples taken after fasting for at least 8 hours.  Glucose, capillary     Status: None   Collection Time: 11/24/24  4:29 AM  Result Value  Ref Range   Glucose-Capillary 95 70 - 99 mg/dL    Comment: Glucose reference range applies only to samples taken after fasting for at least 8 hours.  Glucose, capillary     Status: Abnormal   Collection Time: 11/24/24  7:48 AM  Result Value Ref Range   Glucose-Capillary 101 (H) 70 - 99 mg/dL    Comment: Glucose reference range applies only to samples taken after fasting for at least 8 hours.  Glucose, capillary     Status: None   Collection Time: 11/24/24 11:46 AM  Result Value Ref Range   Glucose-Capillary 92 70 - 99 mg/dL    Comment: Glucose reference range applies only to samples taken after fasting for at least 8 hours.  Glucose, capillary     Status: Abnormal   Collection Time: 11/24/24  3:43 PM  Result Value Ref Range   Glucose-Capillary 136 (H) 70 - 99 mg/dL    Comment: Glucose reference range applies only to samples taken after fasting for at least 8 hours.  Glucose, capillary     Status: Abnormal   Collection Time: 11/24/24  7:44 PM  Result Value Ref Range   Glucose-Capillary 106 (H) 70 - 99 mg/dL    Comment: Glucose reference range applies only to samples taken after fasting for at least 8 hours.  Glucose, capillary     Status: Abnormal   Collection Time: 11/25/24 12:42 AM  Result Value Ref Range   Glucose-Capillary 113 (H) 70 - 99 mg/dL    Comment: Glucose reference range applies only  to samples taken after fasting for at least 8 hours.  Glucose, capillary     Status: Abnormal   Collection Time: 11/25/24  7:34 AM  Result Value Ref Range   Glucose-Capillary 111 (H) 70 - 99 mg/dL    Comment: Glucose reference range applies only to samples taken after fasting for at least 8 hours.   *Note: Due to a large number of results and/or encounters for the requested time period, some results have not been displayed. A complete set of results can be found in Results Review.   No results found.    Blood pressure 138/65, pulse 80, temperature 98.2 F (36.8 C), temperature source  Oral, resp. rate 14, height 5' 3 (1.6 m), weight 43.3 kg, SpO2 98%.  Medical Problem List and Plan: 1. Functional deficits secondary to embolic bilateral cerebral infarcts and debility after prolonged hospital stay  -patient may  shower  -ELOS/Goals: 7-10 days, supervision to mod I goals  2.  Antithrombotics: -DVT/anticoagulation:  Mechanical: Sequential compression devices, below knee Bilateral lower extremities Pharmaceutical: Heparin   -antiplatelet therapy: Asprin 81 mg daily   3. Pain Management: history of peripheral neuropathy Gabapentin  cut back to 100 mg q12 d/t encephalopathy.   PRN tylenol  and tramadol .   4. Mood/Behavior/Sleep: LCSW to follow for evaluation and support when available.   -antipsychotic agents: N/A  -Continue Ritalin  5 mg a.m. to encourage sleep wake cycle.    5. Neuropsych/cognition: This patient is capable of making decisions on her own behalf.  6. Skin/Wound Care: Routine pressure relief measures.   7. Fluids/Electrolytes/Nutrition: Monitor I&O and weight. Follow up labs CBC/CMP     Oropharyngeal dysphagia: Dysphagia: Secondary to CVA/anoxic injury.  -PEG placed on 12/30--supplements caloric intake. Tolerating nocturnal feeds well. -Encourage oral intake during daytime  -try to transition off TF but PO intake has been poor. May need to reduce TF to help stimulate appetite  8. Postoperative hemorrhagic shock with acute blood loss anemia following incisional hernia repair reexploration 12/4 and 12/5:  -Gen Surgery continuing to follow as needed.   9. Acute bilateral cerebellar/cerebral infarct secondary to profound intracranial hypoperfusion due to hemorrhagic shock/hypotension:  -Continue Asprin. Follow up with [facility] outpatient.   10. Acute metabolic encephalopathy: seems much improved, continue to monitor-delirium precautions.   11. ABLA: secondary to hemorrhagic shock/critical illness. Hemoglobin 8.8 (12/31). Monitor CBC.   12. AKI on CKD stage  IIIb: continue to monitor renal function.  -Right renal infarct: incidental finding on CT imaging on 12/4-likely due to hypoperfusion in setting of hypotension. Continue supportive care.   14. HCAP (Enterobacter cloacae on tracheal aspirate culture): Completed antibiotic course.   15. Hypertension: monitor BP per protocol on Amlodipine  10 mg, Coreg  12.5 mg, and Hydralazine  50 mg.   16. Hypothyrodism: continue Synthroid .   17. 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 Serene)  -on Asprin      Daphne LOISE Satterfield, NP 11/25/2024     [1]  Allergies Allergen Reactions   Latex Itching    Must use paper tape   Amitiza  [Lubiprostone ] Diarrhea    Uncontrollable diarrhea   Nortriptyline  Other (See Comments)    Stomach distention   Sulfa Antibiotics Nausea And Vomiting and Other (See Comments)    GI distress/pain   Tramadol  Itching and Rash   Atorvastatin  Nausea And Vomiting   Claritin [Loratadine] Other (See Comments)    Hot flashes   "

## 2024-11-25 NOTE — Progress Notes (Signed)
 Inpatient Rehab Admissions Coordinator:    I have a CIR bed for this Pt. Rn may call report to [phone]  Pt. To admit for estimated 5-7 days with the goal of dc home with husband.   Leita Kleine, MS, CCC-SLP Rehab Admissions Coordinator  [phone] (celll) [phone] (office)

## 2024-11-26 ENCOUNTER — Inpatient Hospital Stay (HOSPITAL_COMMUNITY)

## 2024-11-26 ENCOUNTER — Encounter (HOSPITAL_COMMUNITY): Payer: Self-pay | Admitting: Physical Medicine and Rehabilitation

## 2024-11-26 DIAGNOSIS — I639 Cerebral infarction, unspecified: Secondary | ICD-10-CM

## 2024-11-26 LAB — CBC WITH DIFFERENTIAL/PLATELET
Abs Immature Granulocytes: 0.04 K/uL (ref 0.00–0.07)
Basophils Absolute: 0 K/uL (ref 0.0–0.1)
Basophils Relative: 0 %
Eosinophils Absolute: 0.1 K/uL (ref 0.0–0.5)
Eosinophils Relative: 1 %
HCT: 27 % — ABNORMAL LOW (ref 36.0–46.0)
Hemoglobin: 8.9 g/dL — ABNORMAL LOW (ref 12.0–15.0)
Immature Granulocytes: 0 %
Lymphocytes Relative: 16 %
Lymphs Abs: 1.5 K/uL (ref 0.7–4.0)
MCH: 31.8 pg (ref 26.0–34.0)
MCHC: 33 g/dL (ref 30.0–36.0)
MCV: 96.4 fL (ref 80.0–100.0)
Monocytes Absolute: 1.1 K/uL — ABNORMAL HIGH (ref 0.1–1.0)
Monocytes Relative: 11 %
Neutro Abs: 7 K/uL (ref 1.7–7.7)
Neutrophils Relative %: 72 %
Platelets: 218 K/uL (ref 150–400)
RBC: 2.8 MIL/uL — ABNORMAL LOW (ref 3.87–5.11)
RDW: 15.3 % (ref 11.5–15.5)
WBC: 9.9 K/uL (ref 4.0–10.5)
nRBC: 0 % (ref 0.0–0.2)

## 2024-11-26 LAB — GLUCOSE, CAPILLARY
Glucose-Capillary: 100 mg/dL — ABNORMAL HIGH (ref 70–99)
Glucose-Capillary: 110 mg/dL — ABNORMAL HIGH (ref 70–99)
Glucose-Capillary: 118 mg/dL — ABNORMAL HIGH (ref 70–99)
Glucose-Capillary: 123 mg/dL — ABNORMAL HIGH (ref 70–99)
Glucose-Capillary: 131 mg/dL — ABNORMAL HIGH (ref 70–99)

## 2024-11-26 LAB — IRON AND TIBC
Iron: 39 ug/dL (ref 28–170)
Saturation Ratios: 11 % (ref 10.4–31.8)
TIBC: 360 ug/dL (ref 250–450)
UIBC: 321 ug/dL

## 2024-11-26 LAB — COMPREHENSIVE METABOLIC PANEL WITH GFR
ALT: 22 U/L (ref 0–44)
AST: 23 U/L (ref 15–41)
Albumin: 3.7 g/dL (ref 3.5–5.0)
Alkaline Phosphatase: 95 U/L (ref 38–126)
Anion gap: 11 (ref 5–15)
BUN: 42 mg/dL — ABNORMAL HIGH (ref 8–23)
CO2: 26 mmol/L (ref 22–32)
Calcium: 10 mg/dL (ref 8.9–10.3)
Chloride: 98 mmol/L (ref 98–111)
Creatinine, Ser: 1.55 mg/dL — ABNORMAL HIGH (ref 0.44–1.00)
GFR, Estimated: 36 mL/min — ABNORMAL LOW
Glucose, Bld: 101 mg/dL — ABNORMAL HIGH (ref 70–99)
Potassium: 4.4 mmol/L (ref 3.5–5.1)
Sodium: 134 mmol/L — ABNORMAL LOW (ref 135–145)
Total Bilirubin: 0.3 mg/dL (ref 0.0–1.2)
Total Protein: 6.4 g/dL — ABNORMAL LOW (ref 6.5–8.1)

## 2024-11-26 MED ORDER — POLYETHYLENE GLYCOL 3350 17 G PO PACK
17.0000 g | PACK | Freq: Two times a day (BID) | ORAL | Status: DC
  Administered 2024-11-27 – 2024-12-02 (×9): 17 g via ORAL
  Filled 2024-11-26 (×13): qty 1

## 2024-11-26 MED ORDER — SORBITOL 70 % SOLN
30.0000 mL | Freq: Every day | Status: DC | PRN
  Administered 2024-11-26: 30 mL via ORAL
  Filled 2024-11-26: qty 30

## 2024-11-26 MED ORDER — FERROUS SULFATE 325 (65 FE) MG PO TABS
325.0000 mg | ORAL_TABLET | Freq: Every day | ORAL | Status: DC
  Administered 2024-11-27 – 2024-11-28 (×2): 325 mg via ORAL
  Filled 2024-11-26 (×2): qty 1

## 2024-11-26 MED ORDER — DICLOFENAC SODIUM 1 % EX GEL
2.0000 g | Freq: Four times a day (QID) | CUTANEOUS | Status: DC
  Administered 2024-11-26 – 2024-12-09 (×52): 2 g via TOPICAL
  Filled 2024-11-26 (×2): qty 100

## 2024-11-26 NOTE — Progress Notes (Addendum)
 Initial Nutrition Assessment  DOCUMENTATION CODES:   Severe malnutrition in context of chronic illness, Underweight  INTERVENTION:  Continue bolus feeds via PEG TID following meals if meal completion < 50% Osmolite 1.5: 1 carton TID following meals (0800, 1300, 2000) ProSource TF 20 60 ml daily FWF 200 ml q6h Provides 1065 kcals per day (71% estimated needs), 64g protein (98% estimated needs), 543 ml free water  (TF + FWF= daily)  Encouraged adequate intake of meals to meet calorie and protein needs Magic cup TID with meals, each supplement provides 290 kcal and 9 grams of protein Pt does not like any other ONS  Monitor for bowel movement as severe constipation major barrier to PO intake  NUTRITION DIAGNOSIS:   Severe Malnutrition related to chronic illness as evidenced by severe fat depletion, severe muscle depletion.  GOAL:   Patient will meet greater than or equal to 90% of their needs  MONITOR:   PO intake, Supplement acceptance, TF tolerance  REASON FOR ASSESSMENT:   Consult Assessment of nutrition requirement/status, Enteral/tube feeding initiation and management  ASSESSMENT:   Pt with hx of GERD, IBS, CAD, and HTN. Recent admission for elective hernia repair 12/3 complicated by hypotension and returned to OR 12/4 for ex lap then subsequently abdomen closed 12/5 with no signs of active bleeding. Post stat CT 12/8 concerning for CVA. Course further complicated by dysphagia and malnutrition which led to PEG placement 12/30. Admitted to CIR for comprehensive rehab.   Spoke with pt and pt's husband at bedside. Pt fatigued following PT treatment this morning. Pt complains of knee pain and doctor has been notified. Pt also complains of significant constipation which is impacting appetite and making her nauseous. Pt states at home she would take a laxative in combination with enema to help regulate bowel movements, enema now ordered prn if needed. Suspect once pt has  bowel movement, improvements in PO intake will be seen. May initiate calorie count when that occurs to assess PO status. Pt continues to receive bolus feeds if meal completion < 50% via PEG tube. Has tolerated feeds until pt's nausea got worse with constipation. Will monitor to see if adjustments to feeds need to be made.  Added magic cups to trays to help with oral calorie and protein intake. Pt does not like any other oral supplements (Ensure etc).  Pt's physical exam continues to show severe fat and severe muscle depletions with previously diagnosed malnutrition in the context of chronic illness. Malnutrition likely ongoing and will not resolve if pt's PO intake remains poor. Recommend continuing bolus feeds via PEG which provides 71% of calorie and 98% of protein needs daily when all 3 boluses given.   Average Meal Completion: 1/6: 40% average intake x 1 recorded meals  Nutritionally Relevant Medications: Ferrous sulfate  325 mg daily Levothyroxine  Ritalin   Protonix  Miralax  Senna Zinc  sulfate daily  Labs reviewed: CBG x 24 h: 100-127 mg/dL Sodium 865 Creatinine 8.44 A1c 4.9  Admit weight: 43.1 kg   NUTRITION - FOCUSED PHYSICAL EXAM:  Flowsheet Row Most Recent Value  Orbital Region Moderate depletion  Upper Arm Region Severe depletion  Thoracic and Lumbar Region Severe depletion  Buccal Region Moderate depletion  Temple Region Moderate depletion  Clavicle Bone Region Severe depletion  Clavicle and Acromion Bone Region Severe depletion  Scapular Bone Region Severe depletion  Dorsal Hand Moderate depletion  Patellar Region Severe depletion  Anterior Thigh Region Severe depletion  Posterior Calf Region Severe depletion  Edema (RD Assessment) None  Hair  Reviewed  Eyes Reviewed  Mouth Reviewed  Skin Reviewed  Nails Reviewed    Diet Order:   Diet Order             Diet regular Room service appropriate? Yes with Assist; Fluid consistency: Thin  Diet effective now                    EDUCATION NEEDS:   Education needs have been addressed  Skin:  Skin Assessment: Skin Integrity Issues: Skin Integrity Issues:: Incisions Incisions: abdomen  Last BM:  12/29  Height:   Ht Readings from Last 1 Encounters:  11/25/24 5' 3 (1.6 m)    Weight:   Wt Readings from Last 1 Encounters:  11/26/24 42.7 kg    Ideal Body Weight:  52.27 kg  BMI:  Body mass index is 16.68 kg/m.  Estimated Nutritional Needs:   Kcal:  1500-1700  Protein:  65-85g  Fluid:  1.5-1.7L    Josette Glance, MS, RDN, LDN Clinical Dietitian I Please reach out via secure chat

## 2024-11-26 NOTE — Evaluation (Signed)
 Occupational Therapy Assessment and Plan  Patient Details  Name: Erin [name] MRN: [id number] Date of Birth: 05-Apr-1956  OT Diagnosis: acute pain, muscle weakness (generalized), pain in joint, and decreased activity tolerance, and mild R-sided hemiparesis Rehab Potential: Rehab Potential (ACUTE ONLY): Fair ELOS: ~10-14 days   Today's Date: 11/26/2024 OT Individual Time: 1305-1405 OT Individual Time Calculation (min): 60 min     Hospital Problem: Principal Problem:   CVA (cerebral vascular accident) [facility])   Past Medical History:  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/14 60.4Gy/84fx and chemo   Anxiety    Aortoiliac occlusive disease (HCC) 08/14/2017   Arthritis    B12 deficiency anemia 09/14/2015   Blood transfusion without reported diagnosis    BPPV (benign paroxysmal positional vertigo) 07/08/2015   Cardiomyopathy (HCC) 11/03/2017   Chronic back pain    Chronic daily headache 03/29/2013   takes bc powder   Closed right hip fracture (HCC) 09/10/2015   Coronary artery disease involving native coronary artery of native heart without angina pectoris 10/13/2017   DES to mid RCA   Depression    Essential (hemorrhagic) thrombocythemia (HCC) 09/06/2018   Essential hypertension 09/25/2023   Family history of early CAD 04/08/2016   GERD (gastroesophageal reflux disease)    History of hiatal hernia    Hot flashes    Hyperlipidemia    Hyperlipidemia associated with type 2 diabetes mellitus (HCC) 05/11/2017   Hypothyroidism 09/10/2015   IBS (irritable bowel syndrome) 03/29/2013   Neck pain 07/08/2015   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   takes neurotin   Peripheral vascular disease 11/03/2017   QT prolongation    Sacroiliitis 09/06/2018   Spasms of the hands or feet 10/08/2017   Syncope 06/07/2016   Tubular adenoma of colon 09/08/2003   Vertigo    Wears glasses     Past Surgical History:  Past Surgical History:  Procedure Laterality Date   ABDOMINAL AORTOGRAM N/A 08/02/2017   Procedure: ABDOMINAL AORTOGRAM;  Surgeon: Darron Deatrice LABOR, MD;  [facility];  Service: Cardiovascular;  Laterality: N/A;   ABDOMINAL AORTOGRAM W/LOWER EXTREMITY Bilateral 09/03/2024   Procedure: ABDOMINAL AORTOGRAM W/LOWER EXTREMITY;  Surgeon: Serene Gaile ORN, MD;  Location: [facility];  Service: Cardiovascular;  Laterality: Bilateral;   AORTA - BILATERAL FEMORAL ARTERY BYPASS GRAFT N/A 08/14/2017   Procedure: AORTA BIFEMORAL BYPASS GRAFT;  Surgeon: Oris Krystal FALCON, MD;  Location: [facility] OR;  Service: Vascular;  Laterality: N/A;   COLONOSCOPY     COLONOSCOPY N/A 08/29/2024   Procedure: COLONOSCOPY;  Surgeon: Wilhelmenia Aloha Raddle., MD;  Location: [facility];  Service: Gastroenterology;  Laterality: N/A;   CORONARY STENT INTERVENTION N/A 10/13/2017   Procedure: CORONARY STENT INTERVENTION;  Surgeon: Mady Bruckner, MD;  [facility]: [facility];  Service: Cardiovascular;  Laterality: N/A;   dental implant     ECTOPIC PREGNANCY SURGERY     EMBOLECTOMY N/A 08/14/2017   Procedure: EMBOLECTOMY FEMORAL;  Surgeon: Oris Krystal FALCON, MD;  Location: [facility] OR;  Service: Vascular;  Laterality: N/A;   FEMORAL-POPLITEAL BYPASS GRAFT Right 08/14/2017   Procedure: Right Femoral to Above Knee Popliteal Bypass Graft using Non-Reversed Greater Saphenous Vein Graft from Right Leg;  Surgeon: Oris Krystal FALCON, MD;  Location: [facility] OR;  Service: Vascular;  Laterality: Right;   FLEXIBLE SIGMOIDOSCOPY N/A 08/14/2013   Procedure: FLEXIBLE SIGMOIDOSCOPY;  Surgeon: Gwendlyn ONEIDA Buddy,  MD;  Location: [facility];  Service: Endoscopy;  Laterality: N/A;   IR GASTROSTOMY TUBE MOD SED  11/19/2024   LAPAROTOMY N/A 10/24/2024   Procedure: EXPLORATORY LAPAROTOMY, ABDOMINAL PACKING AND WOUND VAC APPLICATION;  Surgeon: Debby Hila, MD;  Location: [facility] ORS;  Service: General;  Laterality: N/A;  (8) EIGHT  18X18 LAP SPONGE PACKED   LAPAROTOMY N/A 10/25/2024   Procedure: LAPAROTOMY, EXPLORATORY;  Surgeon: Rubin Calamity, MD;  Location: [facility];  Service: General;  Laterality: N/A;  REMOVAL OF PACKING   LEFT HEART CATH AND CORONARY ANGIOGRAPHY N/A 10/13/2017   Procedure: LEFT HEART CATH AND CORONARY ANGIOGRAPHY;  Surgeon: Mady Bruckner, MD;  [facility];  Service: Cardiovascular;  Laterality: N/A;   LOWER EXTREMITY ANGIOGRAPHY Bilateral 08/02/2017   Procedure: Lower Extremity Angiography;  Surgeon: Darron Deatrice LABOR, MD;  Location: [facility] INVASIVE CV LAB;  Service: Cardiovascular;  Laterality: Bilateral;   LOWER EXTREMITY INTERVENTION N/A 09/03/2024   Procedure: LOWER EXTREMITY INTERVENTION;  Surgeon: Serene Gaile ORN, MD;  [facility];  Service: Cardiovascular;  Laterality: N/A;   MULTIPLE TOOTH EXTRACTIONS     PERIPHERAL VASCULAR INTERVENTION  05/22/2018   Procedure: PERIPHERAL VASCULAR INTERVENTION;  Surgeon: Serene Gaile ORN, MD;  [facility];  Service: Cardiovascular;;  SMA and Celiac   PILONIDAL CYST EXCISION     POLYPECTOMY     THROMBECTOMY FEMORAL ARTERY Right 08/14/2017   Procedure: THROMBECTOMY FEMORAL ARTERY;  Surgeon: Oris Krystal FALCON, MD;  Location: [facility] OR;  Service: Vascular;  Laterality: Right;   TONSILLECTOMY     TOTAL HIP ARTHROPLASTY  09/11/2015   Procedure: TOTAL HIP ARTHROPLASTY;  Surgeon: Evalene JONETTA Chancy, MD;  [facility]: [facility];  Service: Orthopedics;;   ULTRASOUND GUIDANCE FOR VASCULAR ACCESS  10/13/2017   Procedure: Ultrasound Guidance For Vascular Access;  Surgeon: Mady Bruckner, MD;  [facility];  Service: Cardiovascular;;   VENTRAL HERNIA REPAIR N/A 10/23/2024   Procedure: REPAIR, HERNIA, VENTRAL, LAPAROSCOPIC;  Surgeon: Rubin Calamity, MD;  Location: [facility];  Service: General;  Laterality: N/A;   VISCERAL ANGIOGRAPHY N/A 03/03/2020   Procedure: MESTENRIC ANGIOGRAPHY;  Surgeon: Serene Gaile ORN, MD;  [facility];  Service: Cardiovascular;  Laterality: N/A;   VISCERAL ANGIOGRAPHY N/A 09/03/2024   Procedure: VISCERAL ANGIOGRAPHY;  Surgeon: Serene Gaile ORN, MD;  [facility];  Service: Cardiovascular;  Laterality: N/A;   VISCERAL ARTERY INTERVENTION N/A 09/03/2024   Procedure: VISCERAL ARTERY INTERVENTION;  Surgeon: Serene Gaile ORN, MD;  [facility];  Service: Cardiovascular;  Laterality: N/A;    Assessment & Plan Clinical Impression: Patient is a 69 year old female with PMHx of Anxiety, Aortoiliac occlusive disease Arthritis, BPPV, Cardiomyopathy, CAD, Depression, HTN, HLD, T2DM, Hypothyroidism, PAD, Anal cancer-s/p chemo/XRT 2014, s/p mesenteric artery stenting (10/14). The patient presented to [facility] for planned robotic hernia repair by Dr. Redmond. Postoperatively she developed hypotension and acute blood loss anemia. CCM consulted for hemorrhagic shock in the setting of hemoperitoneum and requiring intubation and press and transfer to ICU. On 12/4 she returned back to the OR where no active bleeding was identified but the abdomen was packed and wound VAC placed. CT angio abdomen/pelvis: Large right intraperitoneal hematoma with active bleeding from the right epigastric artery, left renal artery occluded at the origin with minimal enhancement. Severe narrowing of the SMA immediately distal to the stent which is patent.  On 12/5 she returned to  OR where there was no evidence of bleeding and abdomen was closed.    She was transitioned off of sedation but remained encephalopathic. On 12/8 Neurology consulted and patient underwent STAT CT head that revealed interval development of multiple cortical and subcortical edema areas involving bilateral cerebral and cerebellar hemispheres. MRI showed evidence of bilateral strokes with scattered areas of restricted diffusion throughout the cerebral hemispheres and cerebellum concerning for acute  infarcts of embolic etiology. An EEG showed continuous slow, generalized. The study is suggestive of generalized cerebral dysfunction ( encephalopathy). No seizures or epileptiform discharges were seen throughout the recording.  Etiology likely profound intracranial hypoperfusion although some initial concern for embolic due to DIC, ultimately ruled out.  Neuro recommended aspirin  81 mg daily. EF 60-65%, carotid doppler without significant stenosis. She was transferred to [facility] ICU on 10/29/24, stabilized in the ICU and extubated on 12/11 an subsequently transferred to [facility] on 12/15. Hospital course complicated by AKI on CKD, Enterobacter cloacae HCAP, dysphagia and malnutrition requiring PEC placement on 12/30, anemia, poorly controlled pain, and neurogenic bowel/bladder. Patient transferred to CIR on 11/25/2024 .    Patient currently requires min-mod A with basic self-care skills secondary to muscle weakness, decreased cardiorespiratoy endurance, unbalanced muscle activation and decreased coordination, decreased attention, decreased awareness, decreased problem solving, and decreased memory, and decreased standing balance and decreased balance strategies.  Prior to hospitalization, patient could complete BADLs independently.   Patient will benefit from skilled intervention to increase independence with basic self-care skills prior to discharge home with care partner.  Anticipate patient will require 24 hour supervision and follow up outpatient.  OT - End of Session Activity Tolerance: Decreased this session Endurance Deficit: Yes OT Assessment Rehab Potential (ACUTE ONLY): Fair OT Barriers to Discharge: Nutrition means;Wound Care OT Patient demonstrates impairments in the following area(s): Balance;Behavior;Cognition;Endurance;Motor;Nutrition;Pain;Safety OT Basic ADL's Functional Problem(s): Bathing;Dressing;Toileting OT Transfers Functional Problem(s): Toilet;Tub/Shower OT Additional  Impairment(s): Fuctional Use of Upper Extremity OT Plan OT Intensity: Minimum of 1-2 x/day, 45 to 90 minutes OT Frequency: 5 out of 7 days OT Duration/Estimated Length of Stay: ~10-14 days OT Treatment/Interventions: Balance/vestibular training;Cognitive remediation/compensation;Community reintegration;Discharge planning;Disease mangement/prevention;DME/adaptive equipment instruction;Functional electrical stimulation;Functional mobility training;Neuromuscular re-education;Pain management;Patient/family education;Self Care/advanced ADL retraining;Skin care/wound managment;Psychosocial support;UE/LE Coordination activities;Therapeutic Exercise;UE/LE Strength taining/ROM;Therapeutic Activities OT Basic Self-Care Anticipated Outcome(s): Supervision OT Toileting Anticipated Outcome(s): Supervision OT Bathroom Transfers Anticipated Outcome(s): Supervision OT Recommendation Recommendations for Other Services: None Patient destination: Home Follow Up Recommendations: Outpatient OT Equipment Recommended: To be determined   OT Evaluation Precautions/Restrictions  Precautions Precautions: Fall Recall of Precautions/Restrictions: Impaired Precaution/Restrictions Comments: Peg Tube; R-sided hemiparesis Restrictions Weight Bearing Restrictions Per Provider Order: No General Chart Reviewed: Yes OT Amount of Missed Time: 10 Minutes Family/Caregiver Present: Yes (Spouse) Pain Pain Assessment Pain Scale: 0-10 Faces Pain Scale: Hurts little more Pain Type: Acute pain Pain Location: Abdomen Pain Intervention(s): Medication (See eMAR);Rest Home Living/Prior Functioning Home Living Available Help at Discharge: Family, Available 24 hours/day Type of Home: House Home Access: Stairs to enter [employer] of Steps: 1 Home Layout: One level Bathroom Shower/Tub: Walk-in shower (Built-in bench, long-handled shower head, and multiple bars.) Bathroom Toilet: Handicapped height Bathroom  Accessibility: Yes  Lives With: Spouse IADL History Occupation: Retired Prior Function Level of Independence: Independent with gait, Independent with transfers  Able to Take Stairs?: Yes Driving: Yes Vision Baseline Vision/History: 1 Wears glasses Ability to See in Adequate Light: 1 Impaired Patient Visual Report: No change from baseline Perception  Perception: Within Functional Limits Praxis Praxis:  WFL Cognition Cognition Overall Cognitive Status: Impaired/Different from baseline Arousal/Alertness: Lethargic Orientation Level: Person;Place Memory: Impaired Memory Impairment: Decreased recall of new information Attention: Focused Focused Attention: Appears intact Awareness: Appears intact Problem Solving: Impaired Problem Solving Impairment: Verbal basic Brief Interview for Mental Status (BIMS) Repetition of Three Words (First Attempt): 3 Temporal Orientation: Year: Nonsensical Temporal Orientation: Month: Accurate within 5 days Temporal Orientation: Day: Correct Recall: Sock: No, could not recall Recall: Blue: No, could not recall Recall: Bed: No, could not recall BIMS Summary Score: 6 Sensation Sensation Light Touch: Appears Intact Coordination Gross Motor Movements are Fluid and Coordinated: No Fine Motor Movements are Fluid and Coordinated: No Coordination and Movement Description: Deficits due to mild R-sided hemiparesis and general debiity/deconditioning. Finger Nose Finger Test: Dysmetric bilaterally. Motor  Motor Motor: Other (comment) Motor - Skilled Clinical Observations: Deficits due to mild R-sided hemiparesis and general debiity/deconditioning.  Balance Balance Balance Assessed: Yes Static Sitting Balance Static Sitting - Balance Support: Feet supported Static Sitting - Level of Assistance: 5: Stand by assistance Dynamic Sitting Balance Dynamic Sitting - Balance Support: Feet supported Dynamic Sitting - Level of Assistance: 5: Stand by  assistance (SUP-CGA) Static Standing Balance Static Standing - Balance Support: During functional activity Static Standing - Level of Assistance: 4: Min assist Dynamic Standing Balance Dynamic Standing - Balance Support: During functional activity Dynamic Standing - Level of Assistance: 4: Min assist;3: Mod assist Extremity/Trunk Assessment RUE Assessment RUE Assessment: Exceptions to [facility] Active Range of Motion (AROM) Comments: WFL General Strength Comments: 3-/5 RUE Body System: Neuro Brunstrum levels for arm and hand: Hand Brunstrum level for hand: Stage VI Isolated joint movements LUE Assessment LUE Assessment: Exceptions to [facility] Active Range of Motion (AROM) Comments: WFL General Strength Comments: 3+/5  Care Tool Care Tool Self Care Eating   Eating Assist Level: Set up assist    Oral Care    Oral Care Assist Level: Set up assist    Bathing   Body parts bathed by patient: Right arm;Chest;Abdomen;Front perineal area;Right upper leg;Left upper leg Body parts bathed by helper: Buttocks;Left arm;Right lower leg;Left lower leg   Assist Level: Minimal Assistance - Patient > 75%    Upper Body Dressing(including orthotics)   What is the patient wearing?: Pull over shirt   Assist Level: Minimal Assistance - Patient > 75%    Lower Body Dressing (excluding footwear)   What is the patient wearing?: Pants Assist for lower body dressing: Minimal Assistance - Patient > 75%    Putting on/Taking off footwear   What is the patient wearing?: Non-skid slipper socks Assist for footwear: Total Assistance - Patient < 25%       Care Tool Toileting Toileting activity   Assist for toileting: Minimal Assistance - Patient > 75%     Care Tool Bed Mobility Roll left and right activity   Roll left and right assist level: Supervision/Verbal cueing    Sit to lying activity   Sit to lying assist level: Supervision/Verbal cueing    Lying to sitting on side of bed activity   Lying to  sitting on side of bed assist level: the ability to move from lying on the back to sitting on the side of the bed with no back support.: Supervision/Verbal cueing     Care Tool Transfers Sit to stand transfer   Sit to stand assist level: Minimal Assistance - Patient > 75%    Chair/bed transfer   Chair/bed transfer assist level: Minimal Assistance - Patient > 75%  Toilet transfer   Assist Level: Minimal Assistance - Patient > 75%     Care Tool Cognition  Expression of Ideas and Wants Expression of Ideas and Wants: 3. Some difficulty - exhibits some difficulty with expressing needs and ideas (e.g, some words or finishing thoughts) or speech is not clear  Understanding Verbal and Non-Verbal Content Understanding Verbal and Non-Verbal Content: 3. Usually understands - understands most conversations, but misses some part/intent of message. Requires cues at times to understand   Memory/Recall Ability Memory/Recall Ability : Current season;That he or she is in a hospital/hospital unit   Refer to Care Plan for Long Term Goals  SHORT TERM GOAL WEEK 1 OT Short Term Goal 1 (Week 1): Pt will tolerate >2 mins of standing ADL with supervision + LRAD. OT Short Term Goal 2 (Week 1): Pt will dress LB with CGA + PRN AE. OT Short Term Goal 3 (Week 1): Pt will perform ambulatory toilet transfers with CGA + LRAD.  Recommendations for other services: Neuropsych   Skilled Therapeutic Intervention  Session began with introduction to OT role, OT POC, and general orientation to rehab unit/schedule. LPN administers medication during session. Pt dresses with levels of assistance noted below. Pt limited by generalized fatigue/pain, missing ~10 mins of skilled intervention.    ADL ADL Eating: Set up Where Assessed-Eating: Bed level Grooming: Setup Where Assessed-Grooming: Edge of bed Upper Body Bathing: Minimal assistance Where Assessed-Upper Body Bathing: Edge of bed Lower Body Bathing: Moderate  assistance;Minimal assistance Where Assessed-Lower Body Bathing: Edge of bed Upper Body Dressing: Minimal assistance Where Assessed-Upper Body Dressing: Edge of bed Lower Body Dressing: Minimal assistance Where Assessed-Lower Body Dressing: Edge of bed Toileting: Minimal assistance Where Assessed-Toileting: Toilet;Bedside Commode Toilet Transfer: Minimal assistance Toilet Transfer Method: Stand pivot Toilet Transfer Equipment: Bedside commode Tub/Shower Transfer: Not assessed Film/video Editor: Not assessed Mobility  Bed Mobility Bed Mobility: Supine to Sit;Sit to Supine Supine to Sit: Supervision/Verbal cueing Sit to Supine: Supervision/Verbal cueing Transfers Sit to Stand: Minimal Assistance - Patient > 75% Stand to Sit: Minimal Assistance - Patient > 75%   Discharge Criteria: Patient will be discharged from OT if patient refuses treatment 3 consecutive times without medical reason, if treatment goals not met, if there is a change in medical status, if patient makes no progress towards goals or if patient is discharged from hospital.  The above assessment, treatment plan, treatment alternatives and goals were discussed and mutually agreed upon: by patient  Nereida Habermann, OTR/L, MSOT  11/26/2024, 2:14 PM

## 2024-11-26 NOTE — Progress Notes (Signed)
 " Inpatient Rehabilitation Care Coordinator Assessment and Plan Patient Details  Name: Erin [name] MRN: [id number] Date of Birth: 02/28/1956  Today's Date: 11/26/2024  Hospital Problems: Principal Problem:   CVA (cerebral vascular accident) [facility])  Past Medical History:  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/14 60.4Gy/32fx and chemo   Anxiety    Aortoiliac occlusive disease (HCC) 08/14/2017   Arthritis    B12 deficiency anemia 09/14/2015   Blood transfusion without reported diagnosis    BPPV (benign paroxysmal positional vertigo) 07/08/2015   Cardiomyopathy (HCC) 11/03/2017   Chronic back pain    Chronic daily headache 03/29/2013   takes bc powder   Closed right hip fracture (HCC) 09/10/2015   Coronary artery disease involving native coronary artery of native heart without angina pectoris 10/13/2017   DES to mid RCA   Depression    Essential (hemorrhagic) thrombocythemia (HCC) 09/06/2018   Essential hypertension 09/25/2023   Family history of early CAD 04/08/2016   GERD (gastroesophageal reflux disease)    History of hiatal hernia    Hot flashes    Hyperlipidemia    Hyperlipidemia associated with type 2 diabetes mellitus (HCC) 05/11/2017   Hypothyroidism 09/10/2015   IBS (irritable bowel syndrome) 03/29/2013   Neck pain 07/08/2015   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   takes neurotin   Peripheral vascular disease 11/03/2017   QT prolongation    Sacroiliitis 09/06/2018   Spasms of the hands or feet 10/08/2017   Syncope 06/07/2016   Tubular adenoma of colon 09/08/2003   Vertigo    Wears glasses    Past Surgical History:  Past Surgical History:  Procedure Laterality Date   ABDOMINAL AORTOGRAM N/A 08/02/2017   Procedure: ABDOMINAL AORTOGRAM;  Surgeon: Darron Deatrice LABOR, MD;  Location: [facility];  Service: Cardiovascular;  Laterality: N/A;   ABDOMINAL  AORTOGRAM W/LOWER EXTREMITY Bilateral 09/03/2024   Procedure: ABDOMINAL AORTOGRAM W/LOWER EXTREMITY;  Surgeon: Serene Gaile ORN, MD;  Location: [facility];  Service: Cardiovascular;  Laterality: Bilateral;   AORTA - BILATERAL FEMORAL ARTERY BYPASS GRAFT N/A 08/14/2017   Procedure: AORTA BIFEMORAL BYPASS GRAFT;  Surgeon: Oris Krystal FALCON, MD;  Location: [facility] OR;  Service: Vascular;  Laterality: N/A;   COLONOSCOPY     COLONOSCOPY N/A 08/29/2024   Procedure: COLONOSCOPY;  Surgeon: Wilhelmenia Aloha Raddle., MD;  Location: [facility];  Service: Gastroenterology;  Laterality: N/A;   CORONARY STENT INTERVENTION N/A 10/13/2017   Procedure: CORONARY STENT INTERVENTION;  Surgeon: Mady Bruckner, MD;  [facility]: [facility];  Service: Cardiovascular;  Laterality: N/A;   dental implant     ECTOPIC PREGNANCY SURGERY     EMBOLECTOMY N/A 08/14/2017   Procedure: EMBOLECTOMY FEMORAL;  Surgeon: Oris Krystal FALCON, MD;  Location: [facility] OR;  Service: Vascular;  Laterality: N/A;   FEMORAL-POPLITEAL BYPASS GRAFT Right 08/14/2017   Procedure: Right Femoral to Above Knee Popliteal Bypass Graft using Non-Reversed Greater Saphenous Vein Graft from Right Leg;  Surgeon: Oris Krystal FALCON, MD;  Location: [facility] OR;  Service: Vascular;  Laterality: Right;   FLEXIBLE SIGMOIDOSCOPY N/A 08/14/2013   Procedure: FLEXIBLE SIGMOIDOSCOPY;  Surgeon: Gwendlyn ONEIDA Buddy, MD;  Location: [facility];  Service: Endoscopy;  Laterality: N/A;   IR GASTROSTOMY TUBE MOD SED  11/19/2024   LAPAROTOMY N/A 10/24/2024   Procedure: EXPLORATORY LAPAROTOMY, ABDOMINAL PACKING AND WOUND VAC APPLICATION;  Surgeon: Debby Hila, MD;  Location:  [facility];  Service: General;  Laterality: N/A;  (8) EIGHT 18X18 LAP SPONGE PACKED   LAPAROTOMY N/A 10/25/2024   Procedure: LAPAROTOMY, EXPLORATORY;  Surgeon: Rubin Calamity, MD;  Location: [facility];  Service: General;  Laterality: N/A;  REMOVAL OF PACKING   LEFT HEART CATH AND CORONARY ANGIOGRAPHY N/A 10/13/2017   Procedure:  LEFT HEART CATH AND CORONARY ANGIOGRAPHY;  Surgeon: Mady Bruckner, MD;  [facility];  Service: Cardiovascular;  Laterality: N/A;   LOWER EXTREMITY ANGIOGRAPHY Bilateral 08/02/2017   Procedure: Lower Extremity Angiography;  Surgeon: Darron Deatrice LABOR, MD;  Location: [facility] INV[facility] CV LAB;  Service: Cardiovascular;  Laterality: Bilateral;   LOWER EXTREMITY INTERVENTION N/A 09/03/2024   Procedure: LOWER EXTREMITY INTERVENTION;  Surgeon: Serene Gaile ORN, MD;  [facility];  Service: Cardiovascular;  Laterality: N/A;   MULTIPLE TOOTH EXTRACTIONS     PERIPHERAL VASCULAR INTERVENTION  05/22/2018   Procedure: PERIPHERAL VASCULAR INTERVENTION;  Surgeon: Serene Gaile ORN, MD;  [facility];  Service: Cardiovascular;;  SMA and Celiac   PILONIDAL CYST EXCISION     POLYPECTOMY     THROMBECTOMY FEMORAL ARTERY Right 08/14/2017   Procedure: THROMBECTOMY FEMORAL ARTERY;  Surgeon: Oris Krystal FALCON, MD;  Location: [facility] OR;  Service: Vascular;  Laterality: Right;   TONSILLECTOMY     TOTAL HIP ARTHROPLASTY  09/11/2015   Procedure: TOTAL HIP ARTHROPLASTY;  Surgeon: Evalene JONETTA Chancy, MD;  Location: [facility];  Service: Orthopedics;;   ULTRASOUND GUIDANCE FOR VASCULAR ACCESS  10/13/2017   Procedure: Ultrasound Guidance For Vascular Access;  Surgeon: Mady Bruckner, MD;  [facility];  Service: Cardiovascular;;   VENTRAL HERNIA REPAIR N/A 10/23/2024   Procedure: REPAIR, HERNIA, VENTRAL, LAPAROSCOPIC;  Surgeon: Rubin Calamity, MD;  Location: [facility];  Service: General;  Laterality: N/A;   VISCERAL ANGIOGRAPHY N/A 03/03/2020   Procedure: MESTENRIC ANGIOGRAPHY;  Surgeon: Serene Gaile ORN, MD;  [facility];  Service: Cardiovascular;  Laterality: N/A;   VISCERAL ANGIOGRAPHY N/A 09/03/2024   Procedure: VISCERAL ANGIOGRAPHY;  Surgeon: Serene Gaile ORN, MD;  [facility];  Service: Cardiovascular;  Laterality: N/A;   VISCERAL ARTERY  INTERVENTION N/A 09/03/2024   Procedure: VISCERAL ARTERY INTERVENTION;  Surgeon: Serene Gaile ORN, MD;  [facility]: [facility];  Service: Cardiovascular;  Laterality: N/A;   Social History:  reports that she quit smoking about 10 years ago. Her smoking use included cigarettes. She smoked an average of 0.5 packs per day. She has never used smokeless tobacco. She reports that she does not drink alcohol and does not use drugs.  Family / Support Systems Marital Status: Married How Long?: 36 years Patient Roles: Spouse, Parent Spouse/Significant Other: Erin [name] (husband) Children: blended family; no biological children. Husband has a daughter. two adult grandsons Other Supports: various family Anticipated Caregiver: Husband Ability/Limitations of Caregiver: None. pt will d/c to home with her husbnad who will provide 24/7 care. States they have support if needed. Caregiver Availability: 24/7 Family Dynamics: Pt lives with her husband  Social History Preferred language: English Religion: [employer] Cultural Background: Pt worked as a catering manager until retirement .pt is not a veteran, however, her husband is with VA benefits. Education: some Charity Fundraiser - How often do you need to have someone help you when you read instructions, pamphlets, or other written material from your doctor or pharmacy?: Never Writes: Yes Employment Status: Retired Marine Scientist Issues: Denies Guardian/Conservator: Denies   Abuse/Neglect Abuse/Neglect  Assessment Can Be Completed: Yes Physical Abuse: Denies Verbal Abuse: Denies Sexual Abuse: Denies Exploitation of patient/patient's resources: Denies Self-Neglect: Denies  Patient response to: Social Isolation - How often do you feel lonely or isolated from those around you?: Never  Emotional Status Pt's affect, behavior and adjustment status: Pt is tired during session and dozing off/on.Her husband answers some of SW questions or makes  corrections. Recent Psychosocial Issues: Denie Psychiatric History: Denies Substance Abuse History: Pt reports she quit smoking at time of admission (12/3); denies rec drug use  Patient / Family Perceptions, Expectations & Goals Pt/Family understanding of illness & functional limitations: Pt and husband have a general understanding of care needs Premorbid pt/family roles/activities: Independent Anticipated changes in roles/activities/participation: Assistance with ADLs/IADLs Pt/family expectations/goals: Pt would like to be as best as she can be  [employer]: None Premorbid Home Care/DME Agencies: None Transportation available at discharge: Husband Is the patient able to respond to transportation needs?: Yes In the past 12 months, has lack of transportation kept you from medical appointments or from getting medications?: No In the past 12 months, has lack of transportation kept you from meetings, work, or from getting things needed for daily living?: No Resource referrals recommended: Neuropsychology  Discharge Planning Living Arrangements: Spouse/significant other Support Systems: Spouse/significant other, Children Type of Residence: Private residence Insurance Resources: Media Planner (specify) ([employer] Medicare) Surveyor, Quantity Resources: Restaurant Manager, Fast Food Screen Referred: No Living Expenses: Mortgage Money Management: Spouse Does the patient have any problems obtaining your medications?: No Home Management: Pt and husband share household duties. Patient/Family Preliminary Plans: Husband Care Coordinator Barriers to Discharge: Decreased caregiver support, Lack of/limited family support, Insurance for SNF coverage Care Coordinator Anticipated Follow Up Needs: HH/OP Expected length of stay: [facility] 10-14  Clinical Impression SW met with pt and pt husband to introduce self, explain role, and inform on ELOS. Pt husband reports he has access to various  DME.   Erin [name] 11/26/2024, 2:34 PM    "

## 2024-11-26 NOTE — Progress Notes (Addendum)
 Met with patient to review current situation, team conference and plan of care. Reviewed medications, peg tube care , bowel/ bladder elimination, safety. Reviewed stroke prophylaxis, HTN, follow up with MD at discharge.  Continue to follow along to provide educational needs to facilitate preparation for discharge.

## 2024-11-26 NOTE — Plan of Care (Signed)
" °  Problem: RH Balance Goal: LTG Patient will maintain dynamic standing with ADLs (OT) Description: LTG:  Patient will maintain dynamic standing balance with assist during activities of daily living (OT)  Flowsheets (Taken 11/26/2024 1314) LTG: Pt will maintain dynamic standing balance during ADLs with: Supervision/Verbal cueing   Problem: RH Bathing Goal: LTG Patient will bathe all body parts with assist levels (OT) Description: LTG: Patient will bathe all body parts with assist levels (OT) Flowsheets (Taken 11/26/2024 1314) LTG: Pt will perform bathing with assistance level/cueing: Supervision/Verbal cueing   Problem: RH Dressing Goal: LTG Patient will perform upper body dressing (OT) Description: LTG Patient will perform upper body dressing with assist, with/without cues (OT). Flowsheets (Taken 11/26/2024 1314) LTG: Pt will perform upper body dressing with assistance level of: Supervision/Verbal cueing Goal: LTG Patient will perform lower body dressing w/assist (OT) Description: LTG: Patient will perform lower body dressing with assist, with/without cues in positioning using equipment (OT) Flowsheets (Taken 11/26/2024 1314) LTG: Pt will perform lower body dressing with assistance level of: Supervision/Verbal cueing   Problem: RH Toileting Goal: LTG Patient will perform toileting task (3/3 steps) with assistance level (OT) Description: LTG: Patient will perform toileting task (3/3 steps) with assistance level (OT)  Flowsheets (Taken 11/26/2024 1314) LTG: Pt will perform toileting task (3/3 steps) with assistance level: Supervision/Verbal cueing   Problem: RH Functional Use of Upper Extremity Goal: LTG Patient will use RT/LT upper extremity as a (OT) Description: LTG: Patient will use right/left upper extremity as a stabilizer/gross assist/diminished/nondominant/dominant level with assist, with/without cues during functional activity (OT) Flowsheets (Taken 11/26/2024 1314) LTG: Use of upper  extremity in functional activities: RUE as diminished level LTG: Pt will use upper extremity in functional activity with assistance level of: Supervision/Verbal cueing   Problem: RH Toilet Transfers Goal: LTG Patient will perform toilet transfers w/assist (OT) Description: LTG: Patient will perform toilet transfers with assist, with/without cues using equipment (OT) Flowsheets (Taken 11/26/2024 1314) LTG: Pt will perform toilet transfers with assistance level of: Supervision/Verbal cueing   Problem: RH Tub/Shower Transfers Goal: LTG Patient will perform tub/shower transfers w/assist (OT) Description: LTG: Patient will perform tub/shower transfers with assist, with/without cues using equipment (OT) Flowsheets (Taken 11/26/2024 1314) LTG: Pt will perform tub/shower stall transfers with assistance level of: Contact Guard/Touching assist   Problem: RH Memory Goal: LTG Patient will demonstrate ability for day to day recall/carry over during activities of daily living with assistance level (OT) Description: LTG:  Patient will demonstrate ability for day to day recall/carry over during activities of daily living with assistance level (OT). Flowsheets (Taken 11/26/2024 1314) LTG:  Patient will demonstrate ability for day to day recall/carry over during activities of daily living with assistance level (OT): Supervision   "

## 2024-11-26 NOTE — Evaluation (Signed)
 Speech Language Pathology Assessment and Plan  Patient Details  Name: Erin [name] MRN: [id number] Date of Birth: 06-26-1956  SLP Diagnosis: Aphasia;Cognitive Impairments;Dysphagia  Rehab Potential: Good ELOS: 10-14 days    Today's Date: 11/26/2024 SLP Individual Time: 1050-1150 SLP Individual Time Calculation (min): 60 min   Hospital Problem: Principal Problem:   CVA (cerebral vascular accident) [facility])  Past Medical History:  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/14 60.4Gy/51fx and chemo   Anxiety    Aortoiliac occlusive disease (HCC) 08/14/2017   Arthritis    B12 deficiency anemia 09/14/2015   Blood transfusion without reported diagnosis    BPPV (benign paroxysmal positional vertigo) 07/08/2015   Cardiomyopathy (HCC) 11/03/2017   Chronic back pain    Chronic daily headache 03/29/2013   takes bc powder   Closed right hip fracture (HCC) 09/10/2015   Coronary artery disease involving native coronary artery of native heart without angina pectoris 10/13/2017   DES to mid RCA   Depression    Essential (hemorrhagic) thrombocythemia (HCC) 09/06/2018   Essential hypertension 09/25/2023   Family history of early CAD 04/08/2016   GERD (gastroesophageal reflux disease)    History of hiatal hernia    Hot flashes    Hyperlipidemia    Hyperlipidemia associated with type 2 diabetes mellitus (HCC) 05/11/2017   Hypothyroidism 09/10/2015   IBS (irritable bowel syndrome) 03/29/2013   Neck pain 07/08/2015   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   takes neurotin   Peripheral vascular disease 11/03/2017   QT prolongation    Sacroiliitis 09/06/2018   Spasms of the hands or feet 10/08/2017   Syncope 06/07/2016   Tubular adenoma of colon 09/08/2003   Vertigo    Wears glasses    Past Surgical History:  Past Surgical History:  Procedure Laterality Date   ABDOMINAL AORTOGRAM N/A  08/02/2017   Procedure: ABDOMINAL AORTOGRAM;  Surgeon: Darron Deatrice LABOR, MD;  [facility]: [facility];  Service: Cardiovascular;  Laterality: N/A;   ABDOMINAL AORTOGRAM W/LOWER EXTREMITY Bilateral 09/03/2024   Procedure: ABDOMINAL AORTOGRAM W/LOWER EXTREMITY;  Surgeon: Serene Gaile ORN, MD;  Location: [facility];  Service: Cardiovascular;  Laterality: Bilateral;   AORTA - BILATERAL FEMORAL ARTERY BYPASS GRAFT N/A 08/14/2017   Procedure: AORTA BIFEMORAL BYPASS GRAFT;  Surgeon: Oris Krystal FALCON, MD;  Location: [facility] OR;  Service: Vascular;  Laterality: N/A;   COLONOSCOPY     COLONOSCOPY N/A 08/29/2024   Procedure: COLONOSCOPY;  Surgeon: Wilhelmenia Aloha Raddle., MD;  Location: [facility] ENDOS[facility];  Service: Gastroenterology;  Laterality: N/A;   CORONARY STENT INTERVENTION N/A 10/13/2017   Procedure: CORONARY STENT INTERVENTION;  Surgeon: Mady Bruckner, MD;  [facility]: [facility];  Service: Cardiovascular;  Laterality: N/A;   dental implant     ECTOPIC PREGNANCY SURGERY     EMBOLECTOMY N/A 08/14/2017   Procedure: EMBOLECTOMY FEMORAL;  Surgeon: Oris Krystal FALCON, MD;  Location: [facility] OR;  Service: Vascular;  Laterality: N/A;   FEMORAL-POPLITEAL BYPASS GRAFT Right 08/14/2017   Procedure: Right Femoral to Above Knee Popliteal Bypass Graft using Non-Reversed Greater Saphenous Vein Graft from Right Leg;  Surgeon: Oris Krystal FALCON, MD;  Location: [facility] OR;  Service: Vascular;  Laterality: Right;   FLEXIBLE SIGMOIDOSCOPY N/A 08/14/2013   Procedure: FLEXIBLE SIGMOIDOSCOPY;  Surgeon: Gwendlyn ONEIDA Buddy, MD;  Location: [facility];  Service: Endoscopy;  Laterality: N/A;   IR GASTROSTOMY TUBE MOD SED  11/19/2024   LAPAROTOMY N/A 10/24/2024   Procedure: EXPLORATORY LAPAROTOMY, ABDOMINAL PACKING AND WOUND VAC APPLICATION;  Surgeon: Debby Hila, MD;  Location: [facility] ORS;  Service: General;  Laterality: N/A;  (8) EIGHT 18X18 LAP SPONGE PACKED   LAPAROTOMY N/A 10/25/2024   Procedure: LAPAROTOMY, EXPLORATORY;  Surgeon:  Rubin Calamity, MD;  Location: [facility] ORS;  Service: General;  Laterality: N/A;  REMOVAL OF PACKING   LEFT HEART CATH AND CORONARY ANGIOGRAPHY N/A 10/13/2017   Procedure: LEFT HEART CATH AND CORONARY ANGIOGRAPHY;  Surgeon: Mady Bruckner, MD;  [facility];  Service: Cardiovascular;  Laterality: N/A;   LOWER EXTREMITY ANGIOGRAPHY Bilateral 08/02/2017   Procedure: Lower Extremity Angiography;  Surgeon: Darron Deatrice LABOR, MD;  Location[facility] INVASIVE CV LAB;  Service: Cardiovascular;  Laterality: Bilateral;   LOWER EXTREMITY INTERVENTION N/A 09/03/2024   Procedure: LOWER EXTREMITY INTERVENTION;  Surgeon: Serene Gaile ORN, MD;  [facility];  Service: Cardiovascular;  Laterality: N/A;   MULTIPLE TOOTH EXTRACTIONS     PERIPHERAL VASCULAR INTERVENTION  05/22/2018   Procedure: PERIPHERAL VASCULAR INTERVENTION;  Surgeon: Serene Gaile ORN, MD;  [facility];  Service: Cardiovascular;;  SMA and Celiac   PILONIDAL CYST EXCISION     POLYPECTOMY     THROMBECTOMY FEMORAL ARTERY Right 08/14/2017   Procedure: THROMBECTOMY FEMORAL ARTERY;  Surgeon: Oris Krystal FALCON, MD;  Location: [facility] OR;  Service: Vascular;  Laterality: Right;   TONSILLECTOMY     TOTAL HIP ARTHROPLASTY  09/11/2015   Procedure: TOTAL HIP ARTHROPLASTY;  Surgeon: Evalene JONETTA Chancy, MD;  [facility]: [facility];  Service: Orthopedics;;   ULTRASOUND GUIDANCE FOR VASCULAR ACCESS  10/13/2017   Procedure: Ultrasound Guidance For Vascular Access;  Surgeon: Mady Bruckner, MD;  [facility];  Service: Cardiovascular;;   VENTRAL HERNIA REPAIR N/A 10/23/2024   Procedure: REPAIR, HERNIA, VENTRAL, LAPAROSCOPIC;  Surgeon: Rubin Calamity, MD;  Location: [facility]S;  Service: General;  Laterality: N/A;   VISCERAL ANGIOGRAPHY N/A 03/03/2020   Procedure: MESTENRIC ANGIOGRAPHY;  Surgeon: Serene Gaile ORN, MD;  [facility];  Service: Cardiovascular;  Laterality: N/A;   VISCERAL ANGIOGRAPHY N/A  09/03/2024   Procedure: VISCERAL ANGIOGRAPHY;  Surgeon: Serene Gaile ORN, MD;  [facility];  Service: Cardiovascular;  Laterality: N/A;   VISCERAL ARTERY INTERVENTION N/A 09/03/2024   Procedure: VISCERAL ARTERY INTERVENTION;  Surgeon: Serene Gaile ORN, MD;  [facility];  Service: Cardiovascular;  Laterality: N/A;    Assessment / Plan / Recommendation Clinical Impression HPI: A 69 y.o. 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), Anxiety, Aortoiliac occlusive disease (HCC) (08/14/2017), Arthritis, B12 deficiency anemia (09/14/2015), Blood transfusion without reported diagnosis, BPPV (benign paroxysmal positional vertigo) (07/08/2015), Cardiomyopathy (HCC) (11/03/2017), Chronic back pain, Chronic daily headache (03/29/2013), Closed right hip fracture (HCC) (09/10/2015), Coronary artery disease involving native coronary artery of native heart without angina pectoris (10/13/2017), Depression, Essential (hemorrhagic) thrombocythemia (HCC) (09/06/2018), Essential hypertension (09/25/2023), Family history of early CAD (04/08/2016), GERD (gastroesophageal reflux disease), History of hiatal hernia, Hot flashes, Hyperlipidemia, Hyperlipidemia associated with type 2 diabetes mellitus (HCC) (05/11/2017), Hypothyroidism (09/10/2015), IBS (irritable bowel syndrome) (03/29/2013), Neck pain (07/08/2015), 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 . She was initially admitted to [facility] 10[facility]2024 for planned robotic hernia repair  by Dr. Rubin.  Postop course was complicated by hypotension requiring pressor support and ICU admission, ultimately determined to be hemorrhagic shock in the setting of hemoperitoneum.  On  12-4, she was brought back to the OR, no active bleed was identified but the abdomen was packed and a wound VAC was placed.  12-5, she was brought back to the OR with no evidence of bleeding and abdomen was closed.  She was transitioned off of sedation but remained encephalopathic, and stat CT head and follow-up MRI on 12-8 showed bilateral strokes with scattered areas of restricted diffusion throughout the cerebral hemispheres and cerebellum, along with edema in the right occipital and left temporal lobes.  Neurology was consulted, etiology likely profound intracranial hypoperfusion although some initial concern for embolic due to DIC, ultimately ruled out.  She was transferred to [facility] ICU on 10/29/24.  She was stabilized in the ICU, extubated 12-11, and subsequently transferred to [facility] on 12-15.  Her hospitalization was otherwise complicated by AKI on CKD stage IIIb, Enterobacter cloacae a HCAP, dysphagia/malnutrition with PEG placement 12-30, metabolic encephalopathy, anemia, hypertension, poorly controlled pain, and neurogenic bowel/bladder.  PM&R was consulted to evaluate appropriateness for IPR admission.    Per chart review, patient lives in a private home with 24/7 assistance from her spouse who is physically capable of assisting her (recently cared for their parents during hospice transition), single level with one-step to enter with grab bars near the tub/shower and toilet.  Prior to admission, she was independent of mobility with no assistive device other community ambulation was limited at times.  She was independent of ADLs and IADLs, but got assistance from her spouse from grocery shopping.  Currently, she is min assist for bed mobility, min assist for sit to stand transfers and min assist +2 for gait up to 15 feet requiring frequent cueing and limited by fatigue.  She is min assist for grooming and mod assist for sitting balance, requiring increased time for initiation and limited by  fatigue.  MBS 12-22 showed mild oropharyngeal dysphagia, she was advanced to a regular diet on 12-31 with transition to nocturnal tube feeds to support nutrition; she is eating 25 to 100% of meals.   Clinical Impression:  Bedside Swallow Evaluation: A bedside swallow evaluation was completed to assess for s/sx of oropharyngeal dysphagia. Oral mechanism exam revealed natural upper dentition and no lower - husband reports patient has dentures, though rarely wears during PO consumption. POs administered included thin liquids via straw and solids. Patient with timely mastication and complete oral clearance. No s/sx of aspiration present. Recommend regular/thin diet with use of standardized precautions including sitting upright during PO, taking small bites/sips at a slow rate and double swallows after solids per FEES recommendation. Small pills may be taken whole in puree, however please crush large. Recommend intermittent supervision during mealtimes.  Cognitive-Linguistic: Patient was evaluated via portions of the Western Aphasia Battery and informal measures to assess cognitive lingustic skills. Patient oriented to self, situation, location and time independently. Expression is characterized by instances of anomia and semantic/phonemic paraphasias. Patient can independently name numbers 1-10, days of the week and required minA for months of the year. Patient named functional items with 60% accuracy independently increasing to 90% accuracy with min A (phrase completion). Patient achieved 100% accuracy during phrase completion task and 90% accuracy during responsive naming. Patient with significant difficulty during divergent naming, only recalling two animals in a one minute span. Repetition intact. Patients receptive language is remarkable  for 100% accuracy during 1-3 step commands and simple yes/no questions. 70% accuracy during complex yes/no questions. Unable to truly assess cognition due to language, however  patient unable to recall emergency contact number 911 and required minA to locate call bell. Patient named 2/4 words after a delay. Seemingly intact attention and awareness of deficits. Patient is 100% intelligibile at the conversational level.  Pt would benefit from skilled ST services to maximize cognition, language and dysphagia in order to maximize functional independence at d/c. Anticipate patient will require 24 hour supervision at d/c and f/u SLP services.    Skilled Therapeutic Interventions          Patient evaluated using portions of a standardized cognitive linguistic assessment and bedside swallow evaluation to assess current cognitive, communicative and swallowing function. See above for details.    SLP Assessment  Patient will need skilled Speech Lanaguage Pathology Services during CIR admission    Recommendations  SLP Diet Recommendations: Age appropriate regular solids;Thin Liquid Administration via: Straw;Cup Medication Administration: Crushed with puree Supervision: Trained caregiver to feed patient;Intermittent supervision to cue for compensatory strategies Compensations: Slow rate;Small sips/bites;Multiple dry swallows after each bite/sip Postural Changes and/or Swallow Maneuvers: Seated upright 90 degrees;Upright 30-60 min after meal Oral Care Recommendations: Oral care BID Patient destination: Home Follow up Recommendations: Home Health SLP;Outpatient SLP;24 hour supervision/assistance Equipment Recommended: None recommended by SLP    SLP Frequency 3 to 5 out of 7 days   SLP Duration  SLP Intensity  SLP Treatment/Interventions 10-14 days  Minumum of 1-2 x/day, 30 to 90 minutes  Cognitive remediation/compensation;Cueing hierarchy;Dysphagia/aspiration precaution training;Functional tasks;Internal/external aids;Multimodal communication approach;Patient/family education;Speech/Language facilitation;Therapeutic Activities    Pain Intermittent pain in knee-  repositioned   SLP Evaluation Cognition Overall Cognitive Status: Impaired/Different from baseline (DTA secondary to language impairments) Arousal/Alertness: Awake/alert Orientation Level: Oriented X4 Year: 2026 Month: January Day of Week: Correct Attention: Focused Focused Attention: Appears intact Memory: Impaired Memory Impairment: Decreased recall of new information Awareness: Appears intact Problem Solving: Impaired Problem Solving Impairment: Verbal basic  Comprehension Auditory Comprehension Overall Auditory Comprehension: Impaired Yes/No Questions: Impaired Basic Biographical Questions: 76-100% accurate Basic Immediate Environment Questions: 75-100% accurate Complex Questions: 50-74% accurate Commands: Within Functional Limits One Step Basic Commands: 75-100% accurate Two Step Basic Commands: 75-100% accurate Multistep Basic Commands: 75-100% accurate Conversation: Simple Expression Expression Primary Mode of Expression: Verbal Verbal Expression Overall Verbal Expression: Impaired Initiation: Impaired Automatic Speech: Name;Social Response;Counting;Day of week;Month of year Level of Generative/Spontaneous Verbalization: Sentence Repetition: No impairment Naming: Impairment Responsive: 76-100% accurate Confrontation: Impaired Divergent: 0-24% accurate Verbal Errors: Semantic paraphasias;Aware of errors;Phonemic paraphasias Oral Motor Oral Motor/Sensory Function Overall Oral Motor/Sensory Function: Within functional limits Motor Speech Overall Motor Speech: Appears within functional limits for tasks assessed Respiration: Within functional limits Resonance: Within functional limits Articulation: Within functional limitis Intelligibility: Intelligible Motor Planning: Within functional limits  Care Tool Care Tool Cognition Ability to hear (with hearing aid or hearing appliances if normally used Ability to hear (with hearing aid or hearing appliances if  normally used): 0. Adequate - no difficulty in normal conservation, social interaction, listening to TV   Expression of Ideas and Wants Expression of Ideas and Wants: 3. Some difficulty - exhibits some difficulty with expressing needs and ideas (e.g, some words or finishing thoughts) or speech is not clear   Understanding Verbal and Non-Verbal Content Understanding Verbal and Non-Verbal Content: 3. Usually understands - understands most conversations, but misses some part/intent of message. Requires cues at times to understand  Memory/Recall  Ability Memory/Recall Ability : Current season;That he or she is in a hospital/hospital unit    Bedside Swallowing Assessment General Previous Swallow Assessment: 12/22 FEES Diet Prior to this Study: Regular;Thin liquids (Level 0) Respiratory Status: Room air Behavior/Cognition: Alert;Cooperative;Pleasant mood;Requires cueing Oral Cavity - Dentition:  (natual upper teeth, lower dentures not avaliable though patient/husband reports does not utilize to eat) Self-Feeding Abilities: Needs assist Patient Positioning: Upright in bed Volitional Cough: Weak Volitional Swallow: Able to elicit  Ice Chips Ice chips: Not tested Thin Liquid Thin Liquid: Within functional limits Presentation: Self Fed;Straw Nectar Thick Nectar Thick Liquid: Not tested Honey Thick Honey Thick Liquid: Not tested Puree Puree: Not tested Solid Solid: Within functional limits Presentation: Self Fed BSE Assessment Risk for Aspiration Impact on safety and function: Mild aspiration risk Other Related Risk Factors: History of dysphagia;Previous CVA;Deconditioning;Cognitive impairment;Prolonged intubation  Short Term Goals: Week 1: SLP Short Term Goal 1 (Week 1): Patient will name functional items with 90% accuracy given min multimodal A SLP Short Term Goal 2 (Week 1): Patient will answer complex yes/no questions with 80% accuracy given min multimodal A SLP Short Term Goal 3  (Week 1): Patient will utilize swallowing compensatory strategies during consumption of least restrictive diet given min verbal A SLP Short Term Goal 4 (Week 1): Patient will demonstrate problem solving skills during functional scenarios given min multimodal A SLP Short Term Goal 5 (Week 1): Patient will recall daily information given min multimodal A  Refer to Care Plan for Long Term Goals  Recommendations for other services: None   Discharge Criteria: Patient will be discharged from SLP if patient refuses treatment 3 consecutive times without medical reason, if treatment goals not met, if there is a change in medical status, if patient makes no progress towards goals or if patient is discharged from hospital.  The above assessment, treatment plan, treatment alternatives and goals were discussed and mutually agreed upon: by patient and by family  Dajanay Northrup M.A., CCC-SLP 11/26/2024, 12:04 PM

## 2024-11-26 NOTE — Patient Care Conference (Addendum)
 Inpatient RehabilitationTeam Conference and Plan of Care Update Date: 11/26/2024   Time: 1009 am    Patient Name: Erin [name]      Medical Record Number: [id number]  Date of Birth: Apr 14, 1956 Sex: Female         Room/Bed: 4M01C/4M01C-01 Payor Info: Payor: [employer] / Plan: [employer] MEDICARE / Product Type: *No Product type* /    Admit Date/Time:  11/25/2024  2:43 PM  Primary Diagnosis:  CVA (cerebral vascular accident) [facility])  Hospital Problems: Principal Problem:   CVA (cerebral vascular accident) [facility])    Expected Discharge Date: Expected Discharge Date:  (TBD)  Team Members Present: Physician leading conference: Dr. Joesph Likes Social Worker Present: Graeme Jude, LCSW Nurse Present: Eulalio Falls, RN PT Present: Kirt Dawn, PT OT Present: Nereida Habermann, OT SLP Present: Recardo Mole, SLP     Current Status/Progress Goal Weekly Team Focus  Bowel/Bladder      Incontinent of bowel and bladder    Remain continence of bowel and bladder     Assess bowel and bladder q shift  Swallow/Nutrition/ Hydration   eval pending           ADL's   Eval pending   Eval pending   Eval pending    Mobility   supervision bed mobility, minA transfers and short distance ambulation without AD, required +2 for WC follow for gait 130'   Supervision  endurance, activity tolerance, balance, ambulation    Communication   eval pending            Safety/Cognition/ Behavioral Observations  eval pending            Pain      Abdominal pain  Right knee pain    <4 w/ prns    <4 w/ prns  Skin     MASD buttocks/ gerharts  cream  Incision of abdomen CDI  Skin free of infection entire stay on rehab   Assess skin q shift       Discharge Planning:  TBA    Team Discussion: Patient was admitted post multifocal CVA due to hypoperfusion from hemorrhagic shock. Patient with right knee pain, severe constipation, nausea and poor meal intake: MD adjusting  medications and treatments.  Patient progress limited by dysphagia, cognitive deficits, aphasia and memory issues.    Patient on target to meet rehab goals: Patient needs min assistance with transfers without AD. Patient  pending evaluations by OT and ST.  *See Care Plan and progress notes for long and short-term goals.   Revisions to Treatment Plan:  Dietary consult KUB   Teaching Needs: Safety, medications, toileting, transfers , incision care, peg tube care, encourage po intake, etc.   Current Barriers to Discharge: Decreased caregiver support, Home enviroment access/layout, and Incontinence  Possible Resolutions to Barriers: Family Education     Medical Summary Current Status: Medically complicated by complex abdominal hernia repair with wounds, abdominal pain, poor p.o. intakes/dysphagia, hemorrhagic shock/acute blood loss anemia, watershed CVAs with cognitive deficits, encephalopathy, constipation, bladder incontinence, and AKI on CKD stage IIIb  Barriers to Discharge: Behavior/Mood;Complicated Wound;Inadequate Nutritional Intake;Incontinence;Medical stability;Neurogenic Bowel & Bladder;Self-care education;Symptomatic Anemia;Uncontrolled Pain   Possible Resolutions to [employer]: Encourage cognitive stimulation during the day and restful sleep at night to improve encephalopathy, escalate bowel regimen and monitor for ileus, encourage p.o. fluids for AKI on CKD and IV fluids repletion as needed, encourage p.o. intakes for nutrition and wound healing   Continued Need for Acute Rehabilitation Level of Care:  The patient requires daily medical management by a physician with specialized training in physical medicine and rehabilitation for the following reasons: Direction of a multidisciplinary physical rehabilitation program to maximize functional independence : Yes Medical management of patient stability for increased activity during participation in an intensive  rehabilitation regime.: Yes Analysis of laboratory values and/or radiology reports with any subsequent need for medication adjustment and/or medical intervention. : Yes   I attest that I was present, lead the team conference, and concur with the assessment and plan of the team.   Bev Drennen Gayo 11/26/2024, 1009 am

## 2024-11-26 NOTE — Progress Notes (Signed)
 "                                                        PROGRESS NOTE   Subjective/Complaints: No events overnight. C/o R anterior knee pain, worsened with activity today patient states has been increasing over approximately 1 week.  Vitals stable      11/26/2024    5:10 AM 11/25/2024    7:50 PM 11/25/2024    3:27 PM  Vitals with BMI  Height   5' 3  Weight   95 lbs  BMI   16.84  Systolic 135 125 868  Diastolic 56 59 60  Pulse 76 73 74    Recent Labs    11/25/24 1535 11/25/24 2142 11/26/24 0609  GLUCAP 127* 114* 100*    Blood sugars well-controlled.  Current labs significant for stable BUN of 42, creatinine 1.55, and hemoglobin 8.9 Mixed continence of bladder--continent of bladder on AM session Last bowel movement 12/28--will get KUB today; complaining of nausea and having some poor p.o. intake secondary to this.  ROS: Denies fevers, chills, N/V, abdominal pain, constipation, diarrhea, SOB, cough, chest pain, new weakness or paraesthesias.   + Nausea, abdominal pain, constipation + Mixed bladder continence + R knee pain  Objective:   No results found. Recent Labs    11/26/24 0530  WBC 9.9  HGB 8.9*  HCT 27.0*  PLT 218   Recent Labs    11/26/24 0530  NA 134*  K 4.4  CL 98  CO2 26  GLUCOSE 101*  BUN 42*  CREATININE 1.55*  CALCIUM  10.0    Intake/Output Summary (Last 24 hours) at 11/26/2024 0914 Last data filed at 11/26/2024 0700 Gross per 24 hour  Intake 836 ml  Output --  Net 836 ml        Physical Exam: Vital Signs Blood pressure (!) 135/56, pulse 76, temperature 98.7 F (37.1 C), temperature source Oral, resp. rate 18, height 5' 3 (1.6 m), weight 43.1 kg, SpO2 97%.   Constitutional: No apparent distress. Appropriate appearance for age.  Laying in bed. HENT: No JVD. Neck Supple. Trachea midline. Atraumatic, normocephalic. + Edentulous lower teeth - compulsively running tongue over lower inner lip--ongoing   Eyes: PERRLA. EOMI. Visual fields  grossly intact  Cardiovascular: RRR, no murmurs/rub/gallops. No Edema. Peripheral pulses 2+  Respiratory: CTAB. No rales, rhonchi, or wheezing. On RA.  Abdomen: + bowel sounds, hypoactive. Mild distention , severe tenderness around PEG/Hernia site.  Skin: C/D/I. No apparent lesions.  - hernia repair site with steri strips, healing well, well-approximated - PEG site covered in gauze, no drainage, c/d/i   MSK:      No apparent deformity. + TTP in the right knee suprapatellar and bilateral joint lines, with small palpable effusion on the medial patella.  No warmth or discoloration.       Neurologic exam:  Cognition: AAO to person, place, and time Language: Mild dysarthria. +speech apraxia, difficulty wordfinding, and substitutions--improving from last exam Memory: Poor, +distractable--much improved Insight: Poor insight into current condition.  Mood: Pleasant affect, appropriate mood.  Sensation: To light touch intact in BL UEs and LEs   Reflexes: 2+ in BL UE; 1+ RLE, 3+ LLE 1-2 beats clonus LLE with ankle jerk. + Hoffman's LUE.  CN: 2-12 grossly intact.   Coordination: BL UE and  LE ataxia, slightly worse on the left; c/b motor apraxia.  Spasticity: MAS 0 in all extremities.        Strength:                RUE: 5-/5 SA, 5-/5 EF, 5-/5 EE, 5-/5 WE, 5-/5 FF, 5-/5 FA                LUE:  4/5 SA, 4/5 EF, 4/5 EE, 4/5 WE, 4/5 FF, 4/5 FA                RLE: 3/5 throughout - limited by knee pain                LLE:  4-/5 HF, 4-/5 KE, 5-/5  DF, 5-/5  EHL, 5-/5  PF         Assessment/Plan: 1. Functional deficits which require 3+ hours per day of interdisciplinary therapy in a comprehensive inpatient rehab setting. Physiatrist is providing close team supervision and 24 hour management of active medical problems listed below. Physiatrist and rehab team continue to assess barriers to discharge/monitor patient progress toward functional and medical goals  Care Tool:  Bathing               Bathing assist       Upper Body Dressing/Undressing Upper body dressing        Upper body assist      Lower Body Dressing/Undressing Lower body dressing            Lower body assist       Toileting Toileting    Toileting assist       Transfers Chair/bed transfer  Transfers assist           Locomotion Ambulation   Ambulation assist              Walk 10 feet activity   Assist           Walk 50 feet activity   Assist           Walk 150 feet activity   Assist           Walk 10 feet on uneven surface  activity   Assist           Wheelchair     Assist               Wheelchair 50 feet with 2 turns activity    Assist            Wheelchair 150 feet activity     Assist          Blood pressure (!) 135/56, pulse 76, temperature 98.7 F (37.1 C), temperature source Oral, resp. rate 18, height 5' 3 (1.6 m), weight 43.1 kg, SpO2 97%.   Medical Problem List and Plan: 1. 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  Antithrombotics: -DVT/anticoagulation:  Mechanical: Sequential compression devices, below knee Bilateral lower extremities Pharmaceutical: sq Heparin  5000 q8             -antiplatelet therapy: Asprin 81 mg daily    3. Pain Management: history of peripheral neuropathy Gabapentin  cut back to 100 mg q12 d/t encephalopathy.  PRN tylenol  and tramadol .  -1/6: Right knee pain for approximately 1 week, will get x-ray, Voltaren  gel 4 times daily, depending on findings may need injection   4. Mood/Behavior/Sleep: LCSW to follow for evaluation and support when available.              -antipsychotic agents: N/A             -Continue Ritalin  5 mg a.m. to encourage alertness     5. Neuropsych/cognition: This patient  is capable of making decisions on her own behalf.   6. Skin/Wound Care: Routine pressure relief measures.   - Surgical sites look good   7. Fluids/Electrolytes/Nutrition/dysphagia: Monitor I&O and weight. Follow up labs CBC/CMP               Oropharyngeal dysphagia: Dysphagia: Secondary to CVA/anoxic injury.  -PEG placed on 12/30-- -Pt to receive a can of osmolite if eats less than 50% of meal -continue protein supp and free water  -consider appetite stimulant  1/6: Severe constipation contributor to nausea and poor meal intake; nutrition consult today.  Treating constipation as below.   8. Incisional hernia repair reexploration 12/4 and 12/5 c/b hematoma:  -Gen Surgery continuing to follow as needed.    9. Acute bilateral cerebellar/cerebral infarct secondary to profound intracranial hypoperfusion due to hemorrhagic shock/hypotension:  -Continue Asprin. Follow up with [facility] outpatient.    10. Acute metabolic encephalopathy: seems much improved, continue to monitor-delirium precautions.    11. ABLA: secondary to hemorrhagic shock/critical illness. Hemoglobin 8.8 (12/31). Monitor CBC.    - 1/6: hemoglobin stable on admission labs, add daily iron supplement 325 mg to assist in RBC repletion.  Add on iron levels and TIBC to labs today   12. AKI on CKD stage IIIb: continue to monitor renal function.  -Right renal infarct: incidental finding on CT imaging on 12/4-likely due to hypoperfusion in setting of hypotension. Continue supportive care.    - Creatinine stable 1.5, BUN remains elevated 42, encourage p.o. fluids  14. HCAP (Enterobacter cloacae on tracheal aspirate culture): Completed antibiotic course.    15. Hypertension: monitor BP per protocol on Amlodipine  10 mg, Coreg  12.5 mg, and Hydralazine  50 mg.    - Blood pressure stable, monitor    11/26/2024    5:10 AM 11/25/2024    7:50 PM 11/25/2024    3:27 PM  Vitals with BMI  Height   5' 3  Weight   95 lbs  BMI   16.84  Systolic 135  125 131  Diastolic 56 59 60  Pulse 76 73 74    16. Hypothyrodism: continue Synthroid .    17. 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 Serene)             -on Aspirin     18.  Severe constipation status post multiple intra-abdominal surgeries.  - Last bowel movement 12/28  - KUB today with large stool burden; pending read if no signs of obstruction will give sorbitol  this p.m. followed by enema if no results  LOS: 1 days A FACE TO FACE EVALUATION WAS PERFORMED  Joesph JAYSON Likes 11/26/2024, 9:14 AM     "

## 2024-11-26 NOTE — Plan of Care (Signed)
" °  Problem: RH Swallowing Goal: LTG Patient will consume least restrictive diet using compensatory strategies with assistance (SLP) Description: LTG:  Patient will consume least restrictive diet using compensatory strategies with assistance (SLP) Flowsheets (Taken 11/26/2024 1210) LTG: Pt Patient will consume least restrictive diet using compensatory strategies with assistance of (SLP): Supervision   Problem: RH Comprehension Communication Goal: LTG Patient will comprehend basic/complex auditory (SLP) Description: LTG: Patient will comprehend basic/complex auditory information with cues (SLP). Flowsheets (Taken 11/26/2024 1210) LTG: Patient will comprehend: Complex auditory information LTG: Patient will comprehend auditory information with cueing (SLP): Supervision   Problem: RH Expression Communication Goal: LTG Patient will increase word finding of common (SLP) Description: LTG:  Patient will increase word finding of common objects/daily info/abstract thoughts with cues using compensatory strategies (SLP). Flowsheets (Taken 11/26/2024 1210) LTG: Patient will increase word finding of common (SLP): Supervision Patient will use compensatory strategies to increase word finding of: Daily info   Problem: RH Problem Solving Goal: LTG Patient will demonstrate problem solving for (SLP) Description: LTG:  Patient will demonstrate problem solving for basic/complex daily situations with cues  (SLP) Flowsheets (Taken 11/26/2024 1210) LTG: Patient will demonstrate problem solving for (SLP): Basic daily situations LTG Patient will demonstrate problem solving for: Supervision   Problem: RH Memory Goal: LTG Patient will demonstrate ability for day to day (SLP) Description: LTG:   Patient will demonstrate ability for day to day recall/carryover during cognitive/linguistic activities with assist  (SLP) Flowsheets (Taken 11/26/2024 1210) LTG: Patient will demonstrate ability for day to day recall: New  information LTG: Patient will demonstrate ability for day to day recall/carryover during cognitive/linguistic activities with assist (SLP): Supervision   "

## 2024-11-26 NOTE — Evaluation (Signed)
 Physical Therapy Assessment and Plan  Patient Details  Name: Erin [name] MRN: [id number] Date of Birth: Jan 12, 1956  PT Diagnosis: Difficulty walking, Hemiplegia dominant, and Muscle weakness Rehab Potential: Good ELOS: 10-14 Days   Today's Date: 11/26/2024 PT Individual Time: 9152-9041 PT Individual Time Calculation (min): 71 min    Hospital Problem: Principal Problem:   CVA (cerebral vascular accident) [facility])   Past Medical History:  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/14 60.4Gy/77fx and chemo   Anxiety    Aortoiliac occlusive disease (HCC) 08/14/2017   Arthritis    B12 deficiency anemia 09/14/2015   Blood transfusion without reported diagnosis    BPPV (benign paroxysmal positional vertigo) 07/08/2015   Cardiomyopathy (HCC) 11/03/2017   Chronic back pain    Chronic daily headache 03/29/2013   takes bc powder   Closed right hip fracture (HCC) 09/10/2015   Coronary artery disease involving native coronary artery of native heart without angina pectoris 10/13/2017   DES to mid RCA   Depression    Essential (hemorrhagic) thrombocythemia (HCC) 09/06/2018   Essential hypertension 09/25/2023   Family history of early CAD 04/08/2016   GERD (gastroesophageal reflux disease)    History of hiatal hernia    Hot flashes    Hyperlipidemia    Hyperlipidemia associated with type 2 diabetes mellitus (HCC) 05/11/2017   Hypothyroidism 09/10/2015   IBS (irritable bowel syndrome) 03/29/2013   Neck pain 07/08/2015   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   takes neurotin   Peripheral vascular disease 11/03/2017   QT prolongation    Sacroiliitis 09/06/2018   Spasms of the hands or feet 10/08/2017   Syncope 06/07/2016   Tubular adenoma of colon 09/08/2003   Vertigo    Wears glasses    Past Surgical History:  Past Surgical History:  Procedure Laterality Date   ABDOMINAL  AORTOGRAM N/A 08/02/2017   Procedure: ABDOMINAL AORTOGRAM;  Surgeon: Darron Deatrice LABOR, MD;  [facility];  Service: Cardiovascular;  Laterality: N/A;   ABDOMINAL AORTOGRAM W/LOWER EXTREMITY Bilateral 09/03/2024   Procedure: ABDOMINAL AORTOGRAM W/LOWER EXTREMITY;  Surgeon: Serene Gaile ORN, MD;  [facility]: [facility];  Service: Cardiovascular;  Laterality: Bilateral;   AORTA - BILATERAL FEMORAL ARTERY BYPASS GRAFT N/A 08/14/2017   Procedure: AORTA BIFEMORAL BYPASS GRAFT;  Surgeon: Oris Krystal FALCON, MD;  Location: [facility] OR;  Service: Vascular;  Laterality: N/A;   COLONOSCOPY     COLONOSCOPY N/A 08/29/2024   Procedure: COLONOSCOPY;  Surgeon: Wilhelmenia Aloha Raddle., MD;  Location: [facility];  Service: Gastroenterology;  Laterality: N/A;   CORONARY STENT INTERVENTION N/A 10/13/2017   Procedure: CORONARY STENT INTERVENTION;  Surgeon: Mady Bruckner, MD;  [facility];  Service: Cardiovascular;  Laterality: N/A;   dental implant     ECTOPIC PREGNANCY SURGERY     EMBOLECTOMY N/A 08/14/2017   Procedure: EMBOLECTOMY FEMORAL;  Surgeon: Oris Krystal FALCON, MD;  Location: [facility] OR;  Service: Vascular;  Laterality: N/A;   FEMORAL-POPLITEAL BYPASS GRAFT Right 08/14/2017   Procedure: Right Femoral to Above Knee Popliteal Bypass Graft using Non-Reversed Greater Saphenous Vein Graft from Right Leg;  Surgeon: Oris Krystal FALCON, MD;  Location: [facility] OR;  Service: Vascular;  Laterality: Right;   FLEXIBLE SIGMOIDOSCOPY N/A 08/14/2013   Procedure: FLEXIBLE SIGMOIDOSCOPY;  Surgeon: Gwendlyn ONEIDA Buddy, MD;  Location: [facility];  Service: Endoscopy;  Laterality: N/A;   IR  GASTROSTOMY TUBE MOD SED  11/19/2024   LAPAROTOMY N/A 10/24/2024   Procedure: EXPLORATORY LAPAROTOMY, ABDOMINAL PACKING AND WOUND VAC APPLICATION;  Surgeon: Debby Hila, MD;  Location: [facility] ORS;  Service: General;  Laterality: N/A;  (8) EIGHT 18X18 LAP SPONGE PACKED   LAPAROTOMY N/A 10/25/2024   Procedure: LAPAROTOMY, EXPLORATORY;   Surgeon: Rubin Calamity, MD;  Location: [facility] OR[facility];  Service: General;  Laterality: N/A;  REMOVAL OF PACKING   LEFT HEART CATH AND CORONARY ANGIOGRAPHY N/A 10/13/2017   Procedure: LEFT HEART CATH AND CORONARY ANGIOGRAPHY;  Surgeon: Mady Bruckner, MD;  [facility];  Service: Cardiovascular;  Laterality: N/A;   LOWER EXTREMITY ANGIOGRAPHY Bilateral 08/02/2017   Procedure: Lower Extremity Angiography;  Surgeon: Darron Deatrice LABOR, MD;  Location: [facility] INVASIVE CV LAB;  Service: Cardiovascular;  Laterality: Bilateral;   LOWER EXTREMITY INTERVENTION N/A 09/03/2024   Procedure: LOWER EXTREMITY INTERVENTION;  Surgeon: Serene Gaile ORN, MD;  [facility];  Service: Cardiovascular;  Laterality: N/A;   MULTIPLE TOOTH EXTRACTIONS     PERIPHERAL VASCULAR INTERVENTION  05/22/2018   Procedure: PERIPHERAL VASCULAR INTERVENTION;  Surgeon: Serene Gaile ORN, MD;  [facility];  Service: Cardiovascular;;  SMA and Celiac   PILONIDAL CYST EXCISION     POLYPECTOMY     THROMBECTOMY FEMORAL ARTERY Right 08/14/2017   Procedure: THROMBECTOMY FEMORAL ARTERY;  Surgeon: Oris Krystal FALCON, MD;  Location: [facility] OR;  Service: Vascular;  Laterality: Right;   TONSILLECTOMY     TOTAL HIP ARTHROPLASTY  09/11/2015   Procedure: TOTAL HIP ARTHROPLASTY;  Surgeon: Evalene JONETTA Chancy, MD;  [facility];  Service: Orthopedics;;   ULTRASOUND GUIDANCE FOR VASCULAR ACCESS  10/13/2017   Procedure: Ultrasound Guidance For Vascular Access;  Surgeon: Mady Bruckner, MD;  [facility];  Service: Cardiovascular;;   VENTRAL HERNIA REPAIR N/A 10/23/2024   Procedure: REPAIR, HERNIA, VENTRAL, LAPAROSCOPIC;  Surgeon: Rubin Calamity, MD;  Location: [facility];  Service: General;  Laterality: N/A;   VISCERAL ANGIOGRAPHY N/A 03/03/2020   Procedure: MESTENRIC ANGIOGRAPHY;  Surgeon: Serene Gaile ORN, MD;  [facility];  Service: Cardiovascular;  Laterality: N/A;   VISCERAL ANGIOGRAPHY N/A  09/03/2024   Procedure: VISCERAL ANGIOGRAPHY;  Surgeon: Serene Gaile ORN, MD;  [facility];  Service: Cardiovascular;  Laterality: N/A;   VISCERAL ARTERY INTERVENTION N/A 09/03/2024   Procedure: VISCERAL ARTERY INTERVENTION;  Surgeon: Serene Gaile ORN, MD;  [facility];  Service: Cardiovascular;  Laterality: N/A;    Assessment & Plan Clinical Impression: Patient is a 69 year old female with PMHx of Anxiety, Aortoiliac occlusive disease Arthritis, BPPV, Cardiomyopathy, CAD, Depression, HTN, HLD, T2DM, Hypothyroidism, PAD, Anal cancer-s/p chemo/XRT 2014, s/p mesenteric artery stenting (10/14). The [facility] for planned robotic hernia repair by Dr. Redmond. Postoperatively she developed hypotension and acute blood loss anemia. CCM consulted for hemorrhagic shock in the setting of hemoperitoneum and requiring intubation and press and transfer to ICU. On 12/4 she returned back to the OR where no active bleeding was identified but the abdomen was packed and wound VAC placed. CT angio abdomen/pelvis: Large right intraperitoneal hematoma with active bleeding from the right epigastric artery, left renal artery occluded at the origin with minimal enhancement. Severe narrowing of the SMA immediately distal to the stent which is patent.  On 12/5 she returned to OR where there was no evidence of bleeding and abdomen was closed.  She was transitioned off of sedation but remained encephalopathic. On 12/8 Neurology consulted and patient underwent STAT CT head that revealed interval development of multiple cortical and subcortical edema areas involving bilateral cerebral and cerebellar hemispheres. MRI showed evidence of bilateral strokes with scattered areas of restricted diffusion throughout the cerebral hemispheres and cerebellum concerning for acute infarcts of embolic etiology. An EEG showed continuous slow, generalized. The study is  suggestive of generalized cerebral dysfunction ( encephalopathy). No seizures or epileptiform discharges were seen throughout the recording.  Etiology likely profound intracranial hypoperfusion although some initial concern for embolic due to DIC, ultimately ruled out.  Neuro recommended aspirin  81 mg daily. EF 60-65%, carotid doppler without significant stenosis. She was transferred to [facility] ICU on 10/29/24, stabilized in the ICU and extubated on 12/11 an subsequently transferred to [facility] on 12/15. Hospital course complicated by AKI on CKD, Enterobacter cloacae HCAP, dysphagia and malnutrition requiring PEC placement on 12/30, anemia, poorly controlled pain, and neurogenic bowel/bladder.    Per chart review, patient lives in a private home with 24/7 assistance from her spouse who is physically capable of assisting her (recently cared for their parents during hospice transition), single level with one-step to enter with grab bars near the tub/shower and toilet. Prior to admission, she was independent of mobility with no assistive device other community ambulation was limited at times. She was independent of ADLs and IADLs, but got assistance from her spouse from grocery shopping. Currently, she is min assist for bed mobility, min assist for sit to stand transfers and min assist +2 for gait up to 15 feet requiring frequent cueing and limited by fatigue. MBS 12-22 showed mild oropharyngeal dysphagia, she was advanced to a regular diet on 12-31 with transition to nocturnal tube feeds to support nutrition; she is eating 25 to 100% of meals.   Patient transferred to CIR on 11/25/2024 .   Patient currently requires min with mobility secondary to muscle weakness, decreased cardiorespiratoy endurance, decreased coordination and decreased motor planning, decreased awareness and decreased memory, and decreased sitting balance, decreased standing balance, decreased postural control, hemiplegia, and decreased  balance strategies.  Prior to hospitalization, patient was independent  with mobility and lived with Spouse in a House home.  Home access is 1Stairs to enter.  Patient will benefit from skilled PT intervention to maximize safe functional mobility for planned discharge home with 24 hour supervision.  Anticipate patient will benefit from follow up OP at discharge.  PT - End of Session Activity Tolerance: Tolerates 30+ min activity with multiple rests Endurance Deficit: Yes PT Assessment Rehab Potential (ACUTE/IP ONLY): Good PT Patient demonstrates impairments in the following area(s): Balance;Endurance;Motor;Pain;Perception;Safety;Sensory PT Transfers Functional Problem(s): Bed Mobility;Bed to Chair;Car;Furniture PT Locomotion Functional Problem(s): Ambulation;Stairs PT Plan PT Intensity: Minimum of 1-2 x/day ,45 to 90 minutes PT Frequency: 5 out of 7 days PT Duration Estimated Length of Stay: 10-14 Days PT Treatment/Interventions: Ambulation/gait training;Community reintegration;DME/adaptive equipment instruction;Psychosocial support;Neuromuscular re-education;Stair training;UE/LE Strength taining/ROM;Balance/vestibular training;Discharge planning;Functional electrical stimulation;Skin care/wound management;Therapeutic Activities;UE/LE Coordination activities;Pain management;Cognitive remediation/compensation;Disease management/prevention;Functional mobility training;Patient/family education;Splinting/orthotics;Therapeutic Exercise;Visual/perceptual remediation/compensation PT Transfers Anticipated Outcome(s): Supervision PT Locomotion Anticipated Outcome(s): Supervision PT Recommendation Recommendations for Other Services: Therapeutic Recreation consult Therapeutic Recreation Interventions: Pet therapy;Stress management Follow Up Recommendations: 24 hour supervision/assistance;Outpatient PT Patient destination: Home Equipment Recommended: To be determined   PT  Evaluation Precautions/Restrictions Restrictions Weight Bearing Restrictions Per Provider Order: No General Chart Reviewed: Yes Family/Caregiver Present: Yes  Pain Interference Pain Interference Pain Effect on Sleep: 1. Rarely or  not at all Pain Interference with Therapy Activities: 3. Frequently Pain Interference with Day-to-Day Activities: 3. Frequently Home Living/Prior Functioning Home Living Available Help at Discharge: Family;Available 24 hours/day Type of Home: House Home Access: Stairs to enter [employer] of Steps: 1 Home Layout: One level Bathroom Shower/Tub: Walk-in shower  Lives With: Spouse Prior Function Level of Independence: Independent with gait;Independent with transfers  Able to Take Stairs?: Yes Vision/Perception  Vision - History Ability to See in Adequate Light: 1 Impaired Perception Perception: Within Functional Limits Praxis Praxis: WFL  Cognition Orientation Level: Oriented X4 Sensation Sensation Light Touch: Impaired by gross assessment Coordination Gross Motor Movements are Fluid and Coordinated: No Fine Motor Movements are Fluid and Coordinated: No Coordination and Movement Description: Generalized deconditioning Motor  Motor Motor: Hemiplegia Motor - Skilled Clinical Observations: mild Rt hemiplegia  Trunk/Postural Assessment  Cervical Assessment Cervical Assessment: Exceptions to [facility] (forward head) Thoracic Assessment Thoracic Assessment: Exceptions to [facility] (rounded shoulders) Lumbar Assessment Lumbar Assessment: Exceptions to [facility] (posterior pelvic tilt) Postural Control Postural Control: Deficits on evaluation (delayed)  Balance Balance Balance Assessed: Yes Static Sitting Balance Static Sitting - Balance Support: Feet supported Static Sitting - Level of Assistance: 5: Stand by assistance Dynamic Sitting Balance Dynamic Sitting - Balance Support: Feet supported Dynamic Sitting - Level of Assistance: 5: Stand by  assistance Static Standing Balance Static Standing - Balance Support: During functional activity Static Standing - Level of Assistance: 4: Min assist Dynamic Standing Balance Dynamic Standing - Balance Support: During functional activity Dynamic Standing - Level of Assistance: 4: Min assist;3: Mod assist Extremity Assessment  RLE Assessment RLE Assessment: Exceptions to [facility] General Strength Comments: Grossly 3+/5 LLE Assessment LLE Assessment: Within Functional Limits  Care Tool Care Tool Bed Mobility Roll left and right activity   Roll left and right assist level: Supervision/Verbal cueing    Sit to lying activity   Sit to lying assist level: Supervision/Verbal cueing    Lying to sitting on side of bed activity   Lying to sitting on side of bed assist level: the ability to move from lying on the back to sitting on the side of the bed with no back support.: Supervision/Verbal cueing     Care Tool Transfers Sit to stand transfer   Sit to stand assist level: Minimal Assistance - Patient > 75%    Chair/bed transfer   Chair/bed transfer assist level: Minimal Assistance - Patient > 75%    Car transfer   Car transfer assist level: Minimal Assistance - Patient > 75%      Care Tool Locomotion Ambulation   Assist level: 2 helpers Assistive device: No Device Max distance: 130'  Walk 10 feet activity   Assist level: Minimal Assistance - Patient > 75% Assistive device: No Device   Walk 50 feet with 2 turns activity   Assist level: 2 helpers Assistive device: No Device  Walk 150 feet activity Walk 150 feet activity did not occur: Safety/medical concerns      Walk 10 feet on uneven surfaces activity   Assist level: Minimal Assistance - Patient > 75%    Stairs   Assist level: Minimal Assistance - Patient > 75%   Max number of stairs: 4  Walk up/down 1 step activity   Walk up/down 1 step (curb) assist level: Minimal Assistance - Patient > 75% Walk up/down 1 step or curb  assistive device: 1 hand rail  Walk up/down 4 steps activity   Walk up/down 4 steps assist level: Minimal Assistance -  Patient > 75% Walk up/down 4 steps assistive device: 1 hand rail  Walk up/down 12 steps activity Walk up/down 12 steps activity did not occur: Safety/medical concerns      Pick up small objects from floor   Pick up small object from the floor assist level: Moderate Assistance - Patient 50 - 74%    Wheelchair Is the patient using a wheelchair?: Yes Type of Wheelchair: Manual   Wheelchair assist level: Dependent - Patient 0% Max wheelchair distance: 150'  Wheel 50 feet with 2 turns activity   Assist Level: Dependent - Patient 0%  Wheel 150 feet activity   Assist Level: Dependent - Patient 0%    Refer to Care Plan for Long Term Goals  SHORT TERM GOAL WEEK 1 PT Short Term Goal 1 (Week 1): Pt will complete sit to stand with CGA PT Short Term Goal 2 (Week 1): Pt will complete bed to chair with CGA. PT Short Term Goal 3 (Week 1): Pt will ambulate x150' with CGA and LRAD.  Recommendations for other services: Therapeutic Recreation  Pet therapy, Stress management, and Other Dance  Skilled Therapeutic Intervention  Evaluation completed (see details above and below) with education on PT POC and goals and individual treatment initiated with focus on bed mobility, balance, transfers, car transfers, ambulation, and stair training. Pt received supine in bed and agrees to therapy. Reports pain Rt knee, abdomen, and head. PT provides rest breaks as needed to help manage pain. Pt performs supine to sit with cues for positioning at EOB and taking extra time at EOB to acclimate to upright positioning. Pt reports need to urinate. Pt performs sit to stand with minA and cue for anterior weight shifting, then ambulates x15' with minA and cues for posture and positioning for transfer to toilet. Following, pt ambulates to sink to wash hands with similar cues and assistance provided. WC  transport to gym. Pt completes ramp navigation and car transfer with minA and cues for upright gaze to improve posture and balance, and safe sequencing for transfer into car. Following seated rest break, pt ambulates x130' without AD, initially with minA but increasing to modA with distance as pt verbalizes increase in nausea and abdominal pain. Eventually pt requires husband to bring [facility] for seated rest break. Pt provided with increased time to recover from nausea. Following, pt completes x4 6 steps with minA and cues for safe step sequencing. Pt again verbalizes increase in nausea. WC transport back to room. Stand pivot to bed with minA. Left supine with all needs within reach.    Mobility Bed Mobility Bed Mobility: Supine to Sit;Sit to Supine Supine to Sit: Supervision/Verbal cueing Sit to Supine: Supervision/Verbal cueing Transfers Transfers: Sit to Stand;Stand to Sit;Stand Pivot Transfers Sit to Stand: Minimal Assistance - Patient > 75% Stand to Sit: Minimal Assistance - Patient > 75% Stand Pivot Transfers: Minimal Assistance - Patient > 75% Stand Pivot Transfer Details: Verbal cues for technique;Verbal cues for sequencing;Tactile cues for sequencing;Tactile cues for posture Transfer (Assistive device): None Locomotion  Gait Ambulation: Yes Gait Assistance: 2 Helpers Gait Distance (Feet): 130 Feet Assistive device: None (+2 for WC follow) Gait Assistance Details: Verbal cues for gait pattern;Verbal cues for technique;Verbal cues for precautions/safety;Tactile cues for posture Gait Gait: Yes Gait Pattern: Impaired Gait Pattern: Decreased stride length;Trunk flexed Stairs / Additional Locomotion Stairs: Yes Stairs Assistance: Minimal Assistance - Patient > 75% Stair Management Technique: Two rails Number of Stairs: 4 Height of Stairs: 6 Ramp: Minimal Assistance - Patient >  75% Curb: Minimal Assistance - Patient >75% Wheelchair Mobility Wheelchair Mobility: No   Discharge  Criteria: Patient will be discharged from PT if patient refuses treatment 3 consecutive times without medical reason, if treatment goals not met, if there is a change in medical status, if patient makes no progress towards goals or if patient is discharged from hospital.  The above assessment, treatment plan, treatment alternatives and goals were discussed and mutually agreed upon: by patient and by family  Elsie JAYSON Dawn, PT, DPT 11/26/2024, 4:59 PM

## 2024-11-26 NOTE — Progress Notes (Signed)
 Inpatient Rehabilitation  Patient information reviewed and entered into eRehab system by [facility]. Karen Kays., CCC/SLP, PPS Coordinator.  Information including medical coding, functional ability and quality indicators will be reviewed and updated through discharge.

## 2024-11-27 LAB — GLUCOSE, CAPILLARY
Glucose-Capillary: 96 mg/dL (ref 70–99)
Glucose-Capillary: 160 mg/dL — ABNORMAL HIGH (ref 70–99)
Glucose-Capillary: 103 mg/dL — ABNORMAL HIGH (ref 70–99)
Glucose-Capillary: 99 mg/dL (ref 70–99)
Glucose-Capillary: 115 mg/dL — ABNORMAL HIGH (ref 70–99)

## 2024-11-27 MED ORDER — METHOCARBAMOL 500 MG PO TABS
500.0000 mg | ORAL_TABLET | Freq: Three times a day (TID) | ORAL | Status: DC
  Administered 2024-11-27 – 2024-11-29 (×6): 500 mg via ORAL
  Filled 2024-11-27 (×6): qty 1

## 2024-11-27 MED ORDER — MELATONIN 5 MG PO TABS
5.0000 mg | ORAL_TABLET | Freq: Every evening | ORAL | Status: DC | PRN
  Administered 2024-11-27 – 2024-12-09 (×6): 5 mg via ORAL
  Filled 2024-11-27 (×6): qty 1

## 2024-11-27 MED ORDER — MIRTAZAPINE 15 MG PO TABS
7.5000 mg | ORAL_TABLET | Freq: Every day | ORAL | Status: DC
  Administered 2024-11-27: 7.5 mg via ORAL
  Filled 2024-11-27: qty 1

## 2024-11-27 NOTE — Progress Notes (Signed)
 Occupational Therapy Session Note  Patient Details  Name: Erin [name] MRN: [id number] Date of Birth: May 14, 1956  Today's Date: 11/27/2024 OT Individual Time: 1000-1119 OT Individual Time Calculation (min): 79 min    Short Term Goals: Week 1:  OT Short Term Goal 1 (Week 1): Pt will tolerate >2 mins of standing ADL with supervision + LRAD. OT Short Term Goal 2 (Week 1): Pt will dress LB with CGA + PRN AE. OT Short Term Goal 3 (Week 1): Pt will perform ambulatory toilet transfers with CGA + LRAD.  Skilled Therapeutic Interventions/Progress Updates:  Pt greeted supine in bed, pt agreeable to OT intervention.      Transfers/bed mobility/functional mobility:  Pt completed supine>sit with CGA to assist with stabilizing trunk when transitioning from sidelying to sitting.   Therapeutic activity:  Pt completed functional ambulation task focused on improving attention, endurance and independence with functional ambulation. Pt instructed  to select one card at a time and then walk ~ 72ft to match cards to vertical board. Pt needed a seated rest break after each lap despite encouragement to attempt to complete more than one lap at a time to improve endurance. Pt needed MIN A at trunk as pt tends to lean forward creating a loss of balance. Max verbal and tactile cues needed for upright posture and to improve gait pattern as pt tends to shuffle her feet.   Pt c/o ABD pain during task, provided pillow at ABD for increased support and comfort, husband reports pt may have an ABD binder at home, asked husband to look and trial wearing binder during mobility for pain mgmt.   Pt reports dizziness during task, BP assessed at 146/71(92) HR 76 bpm. Question some visual deficits as pt reports blurry vision at times, difficult to interpret which aliments are true deficits vs cognition/aphasia. Husband retrieved glasses and pt does report improvement in dizziness.   Pt able to tolerate standing for 2 mins to  complete standing matching task to challenge balance and to improve standing tolerance/upright posture. Pt completed task with CGA- MIN A to maintain upright posture.   ADLs:  Grooming: pt completed seated hand hygiene with set- up assist.   Transfers: pt completed ambulatory toilet transfer with no AD and MINA, assist needed to stabilize trunk and to keep trunk elevated as pt tends to lean forward creating a LOB when ambulating.   Toileting: continent urine void, pt completed 3/3 toileting tasks with MINA to assist with elevating pants back to waist line.     Cognition:           worked on attention, problem solving, word finding and awareness to facilitate improved safety awareness during ADL participation.   Pt given 3 similar word with pt instructed to state how all words could be related such as, [place] , [place]  and [place] . Pt completed task with 80% accuracy, intermittent MIN verbal cues needed to recall correct word.   Ended session with pt supine in bed with all needs within reach, husband checked off on assisting pt with transfers.             Therapy Documentation Precautions:  Precautions Precautions: Fall Recall of Precautions/Restrictions: Impaired Precaution/Restrictions Comments: Peg Tube; R-sided hemiparesis Restrictions Weight Bearing Restrictions Per Provider Order: No  Pain: Unrated pain reported in ABD and R knee, provided rest breaks, repositioning and pillow at ABD for increased support.    Therapy/Group: Individual Therapy  Erin [name] [facility] 11/27/2024, 1:50 PM

## 2024-11-27 NOTE — Care Management (Signed)
 [facility] Individual Statement of Services  Patient Name:  Erin [name]  Date:  11/27/2024  Welcome to the [facility].  Our goal is to provide you with an individualized program based on your diagnosis and situation, designed to meet your specific needs.  With this comprehensive rehabilitation program, you will be expected to participate in at least 3 hours of rehabilitation therapies Monday-Friday, with modified therapy programming on the weekends.  Your rehabilitation program will include the following services:  Physical Therapy (PT), Occupational Therapy (OT), Speech Therapy (ST), 24 hour per day rehabilitation nursing, Therapeutic Recreaction (TR), Psychology, Neuropsychology, Care Coordinator, Rehabilitation Medicine, Nutrition Services, Pharmacy Services, and Other  Weekly team conferences will be held on Tuesday to discuss your progress.  Your Inpatient Rehabilitation Care Coordinator will talk with you frequently to get your input and to update you on team discussions.  Team conferences with you and your family in attendance may also be held.  Expected length of stay: 10-14 days    Overall anticipated outcome: Supervision  Depending on your progress and recovery, your program may change. Your Inpatient Rehabilitation Care Coordinator will coordinate services and will keep you informed of any changes. Your Inpatient Rehabilitation Care Coordinator's name and contact numbers are listed  below.  The following services may also be recommended but are not provided by the Inpatient [facility]:  Driving Evaluations Home Health Rehabiltiation Services Outpatient Rehabilitation Services Vocational Rehabilitation   Arrangements will be made to provide these services after discharge if needed.  Arrangements include referral to agencies that provide these services.  Your insurance has been verified to be:  [facility] Medicare  Your primary  doctor is:  Heron Sharper  Pertinent information will be shared with your doctor and your insurance company.  Inpatient Rehabilitation Care Coordinator:  Graeme Jude, KEN [phone] or [phone]  Information discussed with and copy given to patient by: Graeme DELENA Jude, 11/27/2024, 7:24 AM

## 2024-11-27 NOTE — Progress Notes (Signed)
 Patient ID: Erin [name], female   DOB: [birth year], 69 y.o.   MRN: [id number]    [id number] called pt husband [name] to inform on ELOS, and SW will provide updates after team conference next week.    Graeme Jude, MSW, LCSW Office: [phone] Cell: [phone] Fax: [phone]

## 2024-11-27 NOTE — Progress Notes (Signed)
 "                                                        PROGRESS NOTE   Subjective/Complaints: No events overnight.  Vital stable.  Blood sugars well-controlled. Had a large bowel movement with sorbitol  overnight  ROS: Denies fevers, chills, N/V, abdominal pain, constipation, diarrhea, SOB, cough, chest pain, new weakness or paraesthesias.   + Nausea, abdominal pain, constipation--improved + R knee pain  Objective:   DG Knee 1-2 Views Right Result Date: 11/26/2024 CLINICAL DATA:  Acute right knee pain.  No reported injury. EXAM: RIGHT KNEE - 1-2 VIEW COMPARISON:  None Available. FINDINGS: Minimal tibial spine spur formation. Mild anterior patellar enthesophyte formation at the quadriceps tendon insertion. No fracture, dislocation or effusion. Vascular clips are noted. IMPRESSION: Minimal degenerative changes. Electronically Signed   By: Elspeth Bathe M.D.   On: 11/26/2024 16:56   DG Abd 1 View Result Date: 11/26/2024 CLINICAL DATA:  Constipation. EXAM: DG ABDOMEN 1V COMPARISON:  11/19/2024 FINDINGS: Interval PEG tube. Normal bowel-gas pattern with retained contrast in the colon. Mild amount of stool. Multiple surgical clips. Right hip prosthesis. IMPRESSION: Mild amount of stool. Electronically Signed   By: Elspeth Bathe M.D.   On: 11/26/2024 16:55   Recent Labs    11/26/24 0530  WBC 9.9  HGB 8.9*  HCT 27.0*  PLT 218   Recent Labs    11/26/24 0530  NA 134*  K 4.4  CL 98  CO2 26  GLUCOSE 101*  BUN 42*  CREATININE 1.55*  CALCIUM  10.0    Intake/Output Summary (Last 24 hours) at 11/27/2024 0749 Last data filed at 11/27/2024 0504 Gross per 24 hour  Intake 1070 ml  Output --  Net 1070 ml        Physical Exam: Vital Signs Blood pressure 139/61, pulse 87, temperature 98.1 F (36.7 C), temperature source Oral, resp. rate 16, height 5' 3 (1.6 m), weight 44.6 kg, SpO2 98%.   Constitutional: No apparent distress. Appropriate appearance for age.  Laying in bed. HENT: No JVD.  Neck Supple. Trachea midline. Atraumatic, normocephalic. + Edentulous lower teeth - compulsively running tongue over lower inner lip--ongoing   Eyes: PERRLA. EOMI. Visual fields grossly intact  Cardiovascular: RRR, no murmurs/rub/gallops. No Edema. Peripheral pulses 2+  Respiratory: CTAB. No rales, rhonchi, or wheezing. On RA.  Abdomen: + bowel sounds, hypoactive. Mild distention , + tenderness around PEG/Hernia site.  Skin: C/D/I. No apparent lesions. - hernia repair site with steri strips, healing well, well-approximated - PEG site covered in gauze, no drainage, c/d/i   MSK:      No apparent deformity. + TTP in the right knee suprapatellar and bilateral joint lines, with small palpable effusion on the medial patella.  No warmth or discoloration.       Neurologic exam:  Cognition: AAO to person, place, and time Language: Mild dysarthria. +speech apraxia, difficulty wordfinding, and substitutions--improving from last exam Memory: Poor, +distractable--much improved Insight: Poor insight into current condition.  Mood: Pleasant affect, appropriate mood.  Sensation: To light touch intact in BL UEs and LEs   Reflexes: 2+ in BL UE; 1+ RLE, 3+ LLE 1-2 beats clonus LLE with ankle jerk. + Hoffman's LUE.  CN: 2-12 grossly intact.   Coordination: BL UE and LE ataxia, slightly worse on  the left; c/b motor apraxia.  Spasticity: MAS 0 in all extremities.        Strength:                RUE: 5-/5 SA, 5-/5 EF, 5-/5 EE, 5-/5 WE, 5-/5 FF, 5-/5 FA                LUE:  4/5 SA, 4/5 EF, 4/5 EE, 4/5 WE, 4/5 FF, 4/5 FA                RLE: 3/5 throughout - limited by knee pain                LLE:  4-/5 HF, 4-/5 KE, 5-/5  DF, 5-/5  EHL, 5-/5  PF    Physical exam unchanged from the above on reexamination 11/27/2024       Assessment/Plan: 1. Functional deficits which require 3+ hours per day of interdisciplinary therapy in a comprehensive inpatient rehab setting. Physiatrist is providing close team  supervision and 24 hour management of active medical problems listed below. Physiatrist and rehab team continue to assess barriers to discharge/monitor patient progress toward functional and medical goals  Care Tool:  Bathing    Body parts bathed by patient: Right arm, Chest, Abdomen, Front perineal area, Right upper leg, Left upper leg   Body parts bathed by helper: Buttocks, Left arm, Right lower leg, Left lower leg     Bathing assist Assist Level: Minimal Assistance - Patient > 75%     Upper Body Dressing/Undressing Upper body dressing   What is the patient wearing?: Pull over shirt    Upper body assist Assist Level: Minimal Assistance - Patient > 75%    Lower Body Dressing/Undressing Lower body dressing      What is the patient wearing?: Pants     Lower body assist Assist for lower body dressing: Minimal Assistance - Patient > 75%     Toileting Toileting    Toileting assist Assist for toileting: Minimal Assistance - Patient > 75%     Transfers Chair/bed transfer  Transfers assist     Chair/bed transfer assist level: Minimal Assistance - Patient > 75%     Locomotion Ambulation   Ambulation assist      Assist level: 2 helpers Assistive device: No Device Max distance: 130'   Walk 10 feet activity   Assist     Assist level: Minimal Assistance - Patient > 75% Assistive device: No Device   Walk 50 feet activity   Assist    Assist level: 2 helpers Assistive device: No Device    Walk 150 feet activity   Assist Walk 150 feet activity did not occur: Safety/medical concerns         Walk 10 feet on uneven surface  activity   Assist     Assist level: Minimal Assistance - Patient > 75%     Wheelchair     Assist Is the patient using a wheelchair?: Yes Type of Wheelchair: Manual    Wheelchair assist level: Dependent - Patient 0% Max wheelchair distance: 150'    Wheelchair 50 feet with 2 turns activity    Assist         Assist Level: Dependent - Patient 0%   Wheelchair 150 feet activity     Assist      Assist Level: Dependent - Patient 0%   Blood pressure 139/61, pulse 87, temperature 98.1 F (36.7 C), temperature source Oral, resp. rate 16, height 5' 3 (  1.6 m), weight 44.6 kg, SpO2 98%.   Medical Problem List and Plan: 1. 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  Antithrombotics: -DVT/anticoagulation:  Mechanical: Sequential compression devices, below knee Bilateral lower extremities Pharmaceutical: sq Heparin  5000 q8             -antiplatelet therapy: Asprin 81 mg daily    3. Pain Management: history of peripheral neuropathy Gabapentin  cut back to 100 mg q12 d/t encephalopathy.   PRN tylenol  and tramadol .  -1/6: Right knee pain for approximately 1 week, will get x-ray, Voltaren  gel 4 times daily, depending on findings may need injection--xray with Minimal tibial spine spur formation. Mild anterior patellar enthesophyte formation at the quadriceps tendon insertion. - 1/7: Robaxin  500 mg TID for muscle cramping in quads   4. Mood/Behavior/Sleep: LCSW to follow for evaluation and support when available.              -antipsychotic agents: N/A             -Continue Ritalin  5 mg a.m. to encourage alertness     5. Neuropsych/cognition: This patient is capable of making decisions on her own behalf.   6. Skin/Wound Care: Routine pressure relief measures.   - Surgical sites look good   7. Fluids/Electrolytes/Nutrition/dysphagia: Monitor I&O and weight. Follow up labs CBC/CMP               Oropharyngeal dysphagia: Dysphagia: Secondary to CVA/anoxic injury.  -PEG placed on 12/30-- -Pt to receive a can of osmolite if eats less than 50% of meal -continue protein supp and free water  -consider appetite  stimulant  1/6: Severe constipation contributor to nausea and poor meal intake; nutrition consult today.  Treating constipation as below.  1-7: Converting to bolus feeds if less than 50% meal completion via PEG tube.  Hopeful for p.o. improvement now that she has had a bowel movement   8. Incisional hernia repair reexploration 12/4 and 12/5 c/b hematoma:  -Gen Surgery continuing to follow as needed.    9. Acute bilateral cerebellar/cerebral infarct secondary to profound intracranial hypoperfusion due to hemorrhagic shock/hypotension:  -Continue Asprin. Follow up with [facility] outpatient.    10. Acute metabolic encephalopathy: seems much improved, continue to monitor-delirium precautions.    11. ABLA: secondary to hemorrhagic shock/critical illness. Hemoglobin 8.8 (12/31). Monitor CBC.    - 1/6: hemoglobin stable on admission labs, add daily iron supplement 325 mg to assist in RBC repletion.  Add on iron levels and TIBC to labs today--WNL   12. AKI on CKD stage IIIb: continue to monitor renal function.  -Right renal infarct: incidental finding on CT imaging on 12/4-likely due to hypoperfusion in setting of hypotension. Continue supportive care.    - Creatinine stable 1.5, BUN remains elevated 42, encourage p.o. fluids  14. HCAP (Enterobacter cloacae on tracheal aspirate culture): Completed antibiotic course.    15. Hypertension: monitor BP per protocol on Amlodipine  10 mg, Coreg  12.5 mg, and Hydralazine  50 mg.    - Blood pressure stable, monitor    11/27/2024    5:02 AM 11/27/2024    5:00 AM 11/27/2024    4:55 AM  Vitals with BMI  Weight  98 lbs 5  oz   BMI  17.42   Systolic 139  139  Diastolic 61  61  Pulse 87  87    16. Hypothyrodism: continue Synthroid .    17. 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 Serene)             -on Aspirin     18.  Severe constipation status post multiple intra-abdominal surgeries.  - Last bowel movement 12/28  - KUB  today with large stool burden--no signs of obstruction--gave sorbitol  30 mL  Large bowel movement 1-7 with sorbitol ; increase MiraLAX  to twice daily and monitor  LOS: 2 days A FACE TO FACE EVALUATION WAS PERFORMED  Joesph JAYSON Likes 11/27/2024, 7:49 AM     "

## 2024-11-27 NOTE — Plan of Care (Signed)
" °  Problem: Consults Goal: RH STROKE PATIENT EDUCATION Description: See Patient Education module for education specifics  Outcome: Progressing   Problem: RH BOWEL ELIMINATION Goal: RH STG MANAGE BOWEL WITH ASSISTANCE Description: STG Manage Bowel with supervision Assistance. Outcome: Progressing   Problem: RH BLADDER ELIMINATION Goal: RH STG MANAGE BLADDER WITH ASSISTANCE Description: STG Manage Bladder With supervision Assistance Outcome: Progressing   Problem: RH SKIN INTEGRITY Goal: RH STG SKIN FREE OF INFECTION/BREAKDOWN Description: Manage skin free of infection with supervision assistance Outcome: Progressing   Problem: RH SAFETY Goal: RH STG ADHERE TO SAFETY PRECAUTIONS W/ASSISTANCE/DEVICE Description: STG Adhere to Safety Precautions With Assistance/Device. Outcome: Progressing   Problem: RH PAIN MANAGEMENT Goal: RH STG PAIN MANAGED AT OR BELOW PT'S PAIN GOAL Description: <4 w/ prns Outcome: Progressing   Problem: RH KNOWLEDGE DEFICIT Goal: RH STG INCREASE KNOWLEDGE OF HYPERTENSION Description: Manage increase knowledge of hypertension with supervision assistance from husband using educational materials provided Outcome: Progressing Goal: RH STG INCREASE KNOWLEDGE OF DYSPHAGIA/FLUID INTAKE Description: Manage increase knowledge of dysphagia with supervision assistance from husband using educational materials provided Outcome: Progressing Goal: RH STG INCREASE KNOWLEDGE OF STROKE PROPHYLAXIS Description: Manage increase knowledge of stroke prophylaxis with supervision assistance from husband using educational materials provided Outcome: Progressing   "

## 2024-11-27 NOTE — Progress Notes (Signed)
 Speech Language Pathology Daily Session Note  Patient Details  Name: Erin [name] MRN: [id number] Date of Birth: July 02, 1956  Today's Date: 11/27/2024 SLP Individual Time: 1330-1430 SLP Individual Time Calculation (min): 60 min  Short Term Goals: Week 1: SLP Short Term Goal 1 (Week 1): Patient will name functional items with 90% accuracy given min multimodal A SLP Short Term Goal 2 (Week 1): Patient will answer complex yes/no questions with 80% accuracy given min multimodal A SLP Short Term Goal 3 (Week 1): Patient will utilize swallowing compensatory strategies during consumption of least restrictive diet given min verbal A SLP Short Term Goal 4 (Week 1): Patient will demonstrate problem solving skills during functional scenarios given min multimodal A SLP Short Term Goal 5 (Week 1): Patient will recall daily information given min multimodal A  Skilled Therapeutic Interventions:   Pt and her husband greeted at bedside for tx targeting cognition and communication. She was asleep upon SLP arrival, but woke easily w/ verbal greeting. During initial conversation to build rapport, she benefited from [facility] for sentence completion and word finding. She was able to answer Y/N questions re recent events w/ minA. SLP also facilitated education re aphasia and cognition. Additional questions were answered. She then completed a written expression task with biographical info. She benefited from minA d/t transposition of numbers. She also completed a concrete generative naming task (3 items), providing verbal and written responses. She benefited from only supervisionA for word finding given  functional nature of task. At the end of tx tasks, she was left in bed w/ the alarm set and call light within reach. Recommend cont ST per POC.   Pain  No pain reported  Therapy/Group: Individual Therapy  Recardo DELENA Mole 11/27/2024, 4:31 PM

## 2024-11-27 NOTE — Progress Notes (Signed)
 Physical Therapy Session Note  Patient Details  Name: Erin [name] MRN: [id number] Date of Birth: [birth year]  Today's Date: 11/27/2024 PT Individual Time: 0802-0913 PT Individual Time Calculation (min): 71 min   Short Term Goals: Week 1:  PT Short Term Goal 1 (Week 1): Pt will complete sit to stand with CGA PT Short Term Goal 2 (Week 1): Pt will complete bed to chair with CGA. PT Short Term Goal 3 (Week 1): Pt will ambulate x150' with CGA and LRAD.  Skilled Therapeutic Interventions/Progress Updates:     Pt received semi reclined in  bed and agrees to therapy. Reports pain in Rt knee and abdomen, but much improved from yesterday. PT provides rest breaks and voltarin gel on Rt knee to address pain. Supine to sit with cues for positioning at EOB for safety. Pt dresses while seated at EOB, with occasional minA to facilitate upright balance. Pt performs sit to stand with miNA and cues for anterior weight shifting, then ambulates to sink to brush teeth in sitting, working on coordination and attention to task. WC transport to gym for time management. Pt performs stand step transfer to Nustep with minA and cues for sequencing and positioning. Pt completes Nustep for Rt hemibody NMR and reciprocal coordination training, as well as challenging attention to task. Pt completes 2x5:00 at workload of 3 with average steps per minute ~30.  Pt stands and ambulates x100' with minA at trunk and cues for upright gaze to improve posture and balance, and increasing step height and stride length (Rt>Lt) to promote reciprocal gait pattern and decrease risk for falls. Pt takes seated rest break. Pt attempts additional bout of ambulation and after several feet begins to flex forward and nearly falls, requiring modA to return to upright. Pt unable to clearly verbalize internal processes that caused her to have episode. Seated rest break. Following extended seated rest break, pt ambulates additional x100' with similar  assistance and cues, with husband providing WC follow. WC transport back to room. Stand pivot to bed with minA. Left supine with alarm intact and all needs within reach.   Therapy Documentation Precautions:  Precautions Precautions: Fall Recall of Precautions/Restrictions: Impaired Precaution/Restrictions Comments: Peg Tube; R-sided hemiparesis Restrictions Weight Bearing Restrictions Per Provider Order: No    Therapy/Group: Individual Therapy  Elsie JAYSON Dawn, PT, DPT 11/27/2024, 5:01 PM

## 2024-11-28 LAB — CBC WITH DIFFERENTIAL/PLATELET
Abs Immature Granulocytes: 0.02 K/uL (ref 0.00–0.07)
Basophils Absolute: 0 K/uL (ref 0.0–0.1)
Basophils Relative: 0 %
Eosinophils Absolute: 0.2 K/uL (ref 0.0–0.5)
Eosinophils Relative: 3 %
HCT: 25.6 % — ABNORMAL LOW (ref 36.0–46.0)
Hemoglobin: 8.4 g/dL — ABNORMAL LOW (ref 12.0–15.0)
Immature Granulocytes: 0 %
Lymphocytes Relative: 27 %
Lymphs Abs: 1.6 K/uL (ref 0.7–4.0)
MCH: 32.3 pg (ref 26.0–34.0)
MCHC: 32.8 g/dL (ref 30.0–36.0)
MCV: 98.5 fL (ref 80.0–100.0)
Monocytes Absolute: 0.9 K/uL (ref 0.1–1.0)
Monocytes Relative: 15 %
Neutro Abs: 3.3 K/uL (ref 1.7–7.7)
Neutrophils Relative %: 55 %
Platelets: 233 K/uL (ref 150–400)
RBC: 2.6 MIL/uL — ABNORMAL LOW (ref 3.87–5.11)
RDW: 15.4 % (ref 11.5–15.5)
WBC: 5.9 K/uL (ref 4.0–10.5)
nRBC: 0 % (ref 0.0–0.2)

## 2024-11-28 LAB — BASIC METABOLIC PANEL WITH GFR
Anion gap: 10 (ref 5–15)
BUN: 40 mg/dL — ABNORMAL HIGH (ref 8–23)
CO2: 27 mmol/L (ref 22–32)
Calcium: 9.5 mg/dL (ref 8.9–10.3)
Chloride: 102 mmol/L (ref 98–111)
Creatinine, Ser: 1.64 mg/dL — ABNORMAL HIGH (ref 0.44–1.00)
GFR, Estimated: 34 mL/min — ABNORMAL LOW
Glucose, Bld: 91 mg/dL (ref 70–99)
Potassium: 4.6 mmol/L (ref 3.5–5.1)
Sodium: 138 mmol/L (ref 135–145)

## 2024-11-28 LAB — GLUCOSE, CAPILLARY
Glucose-Capillary: 91 mg/dL (ref 70–99)
Glucose-Capillary: 135 mg/dL — ABNORMAL HIGH (ref 70–99)
Glucose-Capillary: 132 mg/dL — ABNORMAL HIGH (ref 70–99)
Glucose-Capillary: 108 mg/dL — ABNORMAL HIGH (ref 70–99)
Glucose-Capillary: 93 mg/dL (ref 70–99)

## 2024-11-28 MED ORDER — PANTOPRAZOLE SODIUM 40 MG PO TBEC
40.0000 mg | DELAYED_RELEASE_TABLET | Freq: Two times a day (BID) | ORAL | Status: DC
  Administered 2024-11-28 – 2024-12-10 (×24): 40 mg via ORAL
  Filled 2024-11-28 (×24): qty 1

## 2024-11-28 NOTE — Progress Notes (Addendum)
 "                                                        PROGRESS NOTE   Subjective/Complaints: No events overnight.  Vital stable.   Blood sugars well-controlled. Patient feeling nauseous this morning and lethargic, got some breakfast and and then some pills seem to make her sick.  She denies any other symptoms of illness, abdomen remains tender but not more so than prior exams.  ROS: Denies fevers, chills, N/V, abdominal pain, constipation, diarrhea, SOB, cough, chest pain, new weakness or paraesthesias.   + Nausea, abdominal pain, constipation--intermittent/ongoing + R knee pain + Lethargy  Objective:   DG Knee 1-2 Views Right Result Date: 11/26/2024 CLINICAL DATA:  Acute right knee pain.  No reported injury. EXAM: RIGHT KNEE - 1-2 VIEW COMPARISON:  None Available. FINDINGS: Minimal tibial spine spur formation. Mild anterior patellar enthesophyte formation at the quadriceps tendon insertion. No fracture, dislocation or effusion. Vascular clips are noted. IMPRESSION: Minimal degenerative changes. Electronically Signed   By: Elspeth Bathe M.D.   On: 11/26/2024 16:56   DG Abd 1 View Result Date: 11/26/2024 CLINICAL DATA:  Constipation. EXAM: DG ABDOMEN 1V COMPARISON:  11/19/2024 FINDINGS: Interval PEG tube. Normal bowel-gas pattern with retained contrast in the colon. Mild amount of stool. Multiple surgical clips. Right hip prosthesis. IMPRESSION: Mild amount of stool. Electronically Signed   By: Elspeth Bathe M.D.   On: 11/26/2024 16:55   Recent Labs    11/26/24 0530 11/28/24 0524  WBC 9.9 5.9  HGB 8.9* 8.4*  HCT 27.0* 25.6*  PLT 218 233   Recent Labs    11/26/24 0530 11/28/24 0524  NA 134* 138  K 4.4 4.6  CL 98 102  CO2 26 27  GLUCOSE 101* 91  BUN 42* 40*  CREATININE 1.55* 1.64*  CALCIUM  10.0 9.5    Intake/Output Summary (Last 24 hours) at 11/28/2024 0932 Last data filed at 11/28/2024 0929 Gross per 24 hour  Intake 1400 ml  Output --  Net 1400 ml        Physical  Exam: Vital Signs Blood pressure (!) 141/62, pulse 73, temperature 97.9 F (36.6 C), resp. rate 17, height 5' 3 (1.6 m), weight 43.6 kg, SpO2 96%.   Constitutional: No apparent distress. Appropriate appearance for age.  Laying in bed. HENT: No JVD. Neck Supple. Trachea midline. Atraumatic, normocephalic. + Edentulous lower teeth    Eyes: PERRLA. EOMI. Visual fields grossly intact  Cardiovascular: RRR, no murmurs/rub/gallops. No Edema. Peripheral pulses 2+  Respiratory: CTAB. No rales, rhonchi, or wheezing. On RA.  Abdomen: + bowel sounds, hypoactive. Mild distention , + tenderness around PEG/Hernia site.  Skin: C/D/I. No apparent lesions. - hernia repair site with steri strips, healing well, well-approximated - PEG site covered in gauze, some bloody drainage around PEG site  MSK:      No apparent deformity. + TTP in the right knee suprapatellar and bilateral joint lines, with small palpable effusion on the medial patella.  No warmth or discoloration.--Unchanged 1-8       Neurologic exam:  Cognition: AAO to person, place, and time.  Complicated by nausea and fatigue. Language: Mild dysarthria. +speech apraxia, difficulty wordfinding, and substitutions--improving from last exam Memory: Poor, +distractable--much improved Insight: Poor insight into current condition.  Mood: Pleasant affect, appropriate  mood.  Sensation: To light touch intact in BL UEs and LEs   Reflexes: 2+ in BL UE; 1+ RLE, 3+ LLE 1-2 beats clonus LLE with ankle jerk. + Hoffman's LUE.  CN: 2-12 grossly intact.   Coordination: BL UE and LE ataxia, slightly worse on the left; c/b motor apraxia.  Spasticity: MAS 0 in all extremities.        Strength: Assessment complicated by fatigue, all 4 limbs antigravity 1-8  Prior exams:                 RUE: 5-/5 SA, 5-/5 EF, 5-/5 EE, 5-/5 WE, 5-/5 FF, 5-/5 FA                LUE:  4/5 SA, 4/5 EF, 4/5 EE, 4/5 WE, 4/5 FF, 4/5 FA                RLE: 3/5 throughout - limited by  knee pain                LLE:  4-/5 HF, 4-/5 KE, 5-/5  DF, 5-/5  EHL, 5-/5  PF        Assessment/Plan: 1. Functional deficits which require 3+ hours per day of interdisciplinary therapy in a comprehensive inpatient rehab setting. Physiatrist is providing close team supervision and 24 hour management of active medical problems listed below. Physiatrist and rehab team continue to assess barriers to discharge/monitor patient progress toward functional and medical goals  Care Tool:  Bathing    Body parts bathed by patient: Right arm, Chest, Abdomen, Front perineal area, Right upper leg, Left upper leg   Body parts bathed by helper: Buttocks, Left arm, Right lower leg, Left lower leg     Bathing assist Assist Level: Minimal Assistance - Patient > 75%     Upper Body Dressing/Undressing Upper body dressing   What is the patient wearing?: Pull over shirt    Upper body assist Assist Level: Minimal Assistance - Patient > 75%    Lower Body Dressing/Undressing Lower body dressing      What is the patient wearing?: Pants     Lower body assist Assist for lower body dressing: Minimal Assistance - Patient > 75%     Toileting Toileting    Toileting assist Assist for toileting: Minimal Assistance - Patient > 75%     Transfers Chair/bed transfer  Transfers assist     Chair/bed transfer assist level: Minimal Assistance - Patient > 75%     Locomotion Ambulation   Ambulation assist      Assist level: 2 helpers Assistive device: No Device Max distance: 130'   Walk 10 feet activity   Assist     Assist level: Minimal Assistance - Patient > 75% Assistive device: No Device   Walk 50 feet activity   Assist    Assist level: 2 helpers Assistive device: No Device    Walk 150 feet activity   Assist Walk 150 feet activity did not occur: Safety/medical concerns         Walk 10 feet on uneven surface  activity   Assist     Assist level: Minimal  Assistance - Patient > 75%     Wheelchair     Assist Is the patient using a wheelchair?: Yes Type of Wheelchair: Manual    Wheelchair assist level: Dependent - Patient 0% Max wheelchair distance: 150'    Wheelchair 50 feet with 2 turns activity    Assist  Assist Level: Dependent - Patient 0%   Wheelchair 150 feet activity     Assist      Assist Level: Dependent - Patient 0%   Blood pressure (!) 141/62, pulse 73, temperature 97.9 F (36.6 C), resp. rate 17, height 5' 3 (1.6 m), weight 43.6 kg, SpO2 96%.   Medical Problem List and Plan: 1. 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  Antithrombotics: -DVT/anticoagulation:  Mechanical: Sequential compression devices, below knee Bilateral lower extremities Pharmaceutical: sq Heparin  5000 q8             -antiplatelet therapy: Asprin 81 mg daily    3. Pain Management: history of peripheral neuropathy Gabapentin  cut back to 100 mg q12 d/t encephalopathy.   PRN tylenol  and tramadol .  -1/6: Right knee pain for approximately 1 week, will get x-ray, Voltaren  gel 4 times daily, depending on findings may need injection--xray with Minimal tibial spine spur formation. Mild anterior patellar enthesophyte formation at the quadriceps tendon insertion. - 1/7: Robaxin  500 mg TID for muscle cramping in quads   4. Mood/Behavior/Sleep: LCSW to follow for evaluation and support when available.              -antipsychotic agents: N/A             -Continue Ritalin  5 mg a.m. to encourage alertness     1-8: Mirtazapine  7.5 mg nightly added yesterday for sleep and appetite stimulation; patient overly lethargic today, will DC mirtazapine  to see if this improves  5. Neuropsych/cognition: This patient is capable of making decisions on her own behalf.    6. Skin/Wound Care: Routine pressure relief measures.   - Surgical sites look good   7. Fluids/Electrolytes/Nutrition/dysphagia: Monitor I&O and weight. Follow up labs CBC/CMP               Oropharyngeal dysphagia: Dysphagia: Secondary to CVA/anoxic injury.  -PEG placed on 12/30-- -Pt to receive a can of osmolite if eats less than 50% of meal -continue protein supp and free water  -consider appetite stimulant  1/6: Severe constipation contributor to nausea and poor meal intake; nutrition consult today.  Treating constipation as below.  1-7: Converting to bolus feeds if less than 50% meal completion via PEG tube.  Hopeful for p.o. improvement now that she has had a bowel movement   8. Incisional hernia repair reexploration 12/4 and 12/5 c/b hematoma:  -Gen Surgery continuing to follow as needed.    9. Acute bilateral cerebellar/cerebral infarct secondary to profound intracranial hypoperfusion due to hemorrhagic shock/hypotension:  -Continue Asprin. Follow up with [facility] outpatient.    10. Acute metabolic encephalopathy: seems much improved, continue to monitor-delirium precautions.    11. ABLA: secondary to hemorrhagic shock/critical illness. Hemoglobin 8.8 (12/31). Monitor CBC.    - 1/6: hemoglobin stable on admission labs, add daily iron supplement 325 mg to assist in RBC repletion.  Add on iron levels and TIBC to labs today--WNL  1-8: DC iron supplement due to stomach upset, nausea   12. AKI on CKD stage IIIb: continue to monitor renal function.  -Right renal infarct: incidental finding on CT imaging on 12/4-likely due to hypoperfusion in setting of hypotension. Continue supportive care.    -  Creatinine stable 1.5, BUN remains elevated 42, encourage p.o. fluids.   1-8: Creatinine 1.6 today, BUN 40.  Discussed need to eat fluid intake with patient.  Depending on I's and O's today, may give IV fluids tomorrow.  14. HCAP (Enterobacter cloacae on tracheal aspirate culture): Completed  antibiotic course.    15. Hypertension: monitor BP per protocol on Amlodipine  10 mg, Coreg  12.5 mg, and Hydralazine  50 mg.    - Blood pressure stable, monitor    11/28/2024    4:37 AM 11/28/2024    4:36 AM 11/27/2024    8:55 PM  Vitals with BMI  Weight 96 lbs 2 oz    BMI 17.03    Systolic  141 138  Diastolic  62 63  Pulse  73     16. Hypothyrodism: continue Synthroid .    17. 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 Serene)             -on Aspirin     18.  Severe constipation status post multiple intra-abdominal surgeries.  - Last bowel movement 12/28  - KUB today with large stool burden--no signs of obstruction--gave sorbitol  30 mL  Large bowel movement 1-7 with sorbitol ; increase MiraLAX  to twice daily and monitor  19.  Nausea.  Encouraged use of as needed Zofran .  On Protonix .,  Will increase to twice daily AC.  - DC iron supplement as above  - CMP in AM  LOS: 3 days A FACE TO FACE EVALUATION WAS PERFORMED  Joesph JAYSON Likes 11/28/2024, 9:32 AM     "

## 2024-11-28 NOTE — Progress Notes (Signed)
 Physical Therapy Session Note  Patient Details  Name: Erin [name] MRN: [id number] Date of Birth: [birth year]  Today's Date: 11/28/2024 PT Individual Time: 8597-8541 PT Individual Time Calculation (min): 56 min   Short Term Goals: Week 1:  PT Short Term Goal 1 (Week 1): Pt will complete sit to stand with CGA PT Short Term Goal 2 (Week 1): Pt will complete bed to chair with CGA. PT Short Term Goal 3 (Week 1): Pt will ambulate x150' with CGA and LRAD.  Skilled Therapeutic Interventions/Progress Updates:     Pt received supine in bed and agrees to therapy. Pt reports pain in many places during session, including calves, Rt knee, back, and abdomen. Pt appears to perseverate on pain and malaise. PT provides rest breaks as and gentle mobility as needed to manage pain. Pt performs supine to sit with minA and cues for positioning at EOB. Pt performs stand step transfer from edge of bed to [facility] with modA, with decreased stability and balance. WC transport to gym. Pt participates in dance activity to provide endurance challenge, as well as coordination task. Pt performs upper and lower body movements in conjunction with auditory and visual cues. PT provides cues for correct performance and NM feedback. WC transport back to room. Stand pivot back to bed with minA. Left supine in bed with alarm intact and all needs within reach.   Therapy Documentation Precautions:  Precautions Precautions: Fall Recall of Precautions/Restrictions: Impaired Precaution/Restrictions Comments: Peg Tube; R-sided hemiparesis Restrictions Weight Bearing Restrictions Per Provider Order: No    Therapy/Group: Individual Therapy  Elsie JAYSON Dawn, PT, DPT 11/28/2024, 3:48 PM

## 2024-11-28 NOTE — Evaluation (Addendum)
 Recreational Therapy Assessment and Plan  Patient Details  Name: Erin [name] MRN: [id number] Date of Birth: [birth year] Today's Date: [birth year]  Rehab Potential:  Good ELOS:   10 days  Assessment  Hospital Problem: Principal Problem:   CVA (cerebral vascular accident) [facility])     Past Medical History:      Past Medical History:  Diagnosis Date   Allergy     Alopecia     Anal cancer (HCC) 08/14/2013    invasive squamous cell ca, s/p radiation 10/20-11/26/14 60.4Gy/26fx and chemo   Anxiety     Aortoiliac occlusive disease (HCC) 08/14/2017   Arthritis     B12 deficiency anemia 09/14/2015   Blood transfusion without reported diagnosis     BPPV (benign paroxysmal positional vertigo) 07/08/2015   Cardiomyopathy (HCC) 11/03/2017   Chronic back pain     Chronic daily headache 03/29/2013    takes bc powder   Closed right hip fracture (HCC) 09/10/2015   Coronary artery disease involving native coronary artery of native heart without angina pectoris 10/13/2017    DES to mid RCA   Depression     Essential (hemorrhagic) thrombocythemia (HCC) 09/06/2018   Essential hypertension 09/25/2023   Family history of early CAD 04/08/2016   GERD (gastroesophageal reflux disease)     History of hiatal hernia     Hot flashes     Hyperlipidemia     Hyperlipidemia associated with type 2 diabetes mellitus (HCC) 05/11/2017   Hypothyroidism 09/10/2015   IBS (irritable bowel syndrome) 03/29/2013   Neck pain 07/08/2015   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    takes neurotin   Peripheral vascular disease 11/03/2017   QT prolongation     Sacroiliitis 09/06/2018   Spasms of the hands or feet 10/08/2017   Syncope 06/07/2016   Tubular adenoma of colon 09/08/2003   Vertigo     Wears glasses          Past Surgical History:       Past Surgical History:  Procedure Laterality Date   ABDOMINAL AORTOGRAM N/A 08/02/2017    Procedure: ABDOMINAL AORTOGRAM;  Surgeon: Darron Deatrice LABOR, MD;  Location: [facility]  INVASIVE CV LAB;  Service: Cardiovascular;  Laterality: N/A;   ABDOMINAL AORTOGRAM W/LOWER EXTREMITY Bilateral 09/03/2024    Procedure: ABDOMINAL AORTOGRAM W/LOWER EXTREMITY;  Surgeon: Serene Gaile ORN, MD;  Location: [facility] INVASIVE CV LAB;  Service: Cardiovascular;  Laterality: Bilateral;   AORTA - BILATERAL FEMORAL ARTERY BYPASS GRAFT N/A 08/14/2017    Procedure: AORTA BIFEMORAL BYPASS GRAFT;  Surgeon: Oris Krystal FALCON, MD;  Location: [facility] OR;  Service: Vascular;  Laterality: N/A;   COLONOSCOPY       COLONOSCOPY N/A 08/29/2024    Procedure: COLONOSCOPY;  Surgeon: Wilhelmenia Aloha Raddle., MD;  Location: [facility] ENDOSCOPY;  Service: Gastroenterology;  Laterality: N/A;   CORONARY STENT INTERVENTION N/A 10/13/2017    Procedure: CORONARY STENT INTERVENTION;  Surgeon: Mady Bruckner, MD;  Location: [facility] INVASIVE CV LAB;  Service: Cardiovascular;  Laterality: N/A;   dental implant       ECTOPIC PREGNANCY SURGERY       EMBOLECTOMY N/A 08/14/2017    Procedure: EMBOLECTOMY FEMORAL;  Surgeon: Oris Krystal FALCON, MD;  Location[facility] OR;  Service: Vascular;  Laterality: N/A;   FEMORAL-POPLITEAL BYPASS GRAFT Right 08/14/2017    Procedure: Right Femoral to Above Knee Popliteal Bypass Graft using Non-Reversed Greater Saphenous Vein Graft from Right Leg;  Surgeon: Oris Krystal FALCON, MD;  Location: [facility] OR;  Service: Vascular;  Laterality: Right;  FLEXIBLE SIGMOIDOSCOPY N/A 08/14/2013    Procedure: FLEXIBLE SIGMOIDOSCOPY;  Surgeon: Gwendlyn ONEIDA Buddy, MD;  Location: [facility] ENDOSCOPY;  Service: Endoscopy;  Laterality: N/A;   IR GASTROSTOMY TUBE MOD SED   11/19/2024   LAPAROTOMY N/A 10/24/2024    Procedure: EXPLORATORY LAPAROTOMY, ABDOMINAL PACKING AND WOUND VAC APPLICATION;  Surgeon: Debby Hila, MD;  Location: [facility] ORS;  Service: General;  Laterality: N/A;  (8) EIGHT 18X18 LAP SPONGE PACKED   LAPAROTOMY N/A 10/25/2024    Procedure: LAPAROTOMY, EXPLORATORY;  Surgeon: Rubin Calamity, MD;  Location: [facility] ORS;  Service: General;  Laterality: N/A;   REMOVAL OF PACKING   LEFT HEART CATH AND CORONARY ANGIOGRAPHY N/A 10/13/2017    Procedure: LEFT HEART CATH AND CORONARY ANGIOGRAPHY;  Surgeon: Mady Bruckner, MD;  Location: [facility] INVASIVE CV LAB;  Service: Cardiovascular;  Laterality: N/A;   LOWER EXTREMITY ANGIOGRAPHY Bilateral 08/02/2017    Procedure: Lower Extremity Angiography;  Surgeon: Darron Deatrice LABOR, MD;  Location: [facility] INVASIVE CV LAB;  Service: Cardiovascular;  Laterality: Bilateral;   LOWER EXTREMITY INTERVENTION N/A 09/03/2024    Procedure: LOWER EXTREMITY INTERVENTION;  Surgeon: Serene Gaile ORN, MD;  Location: [facility] INVASIVE CV LAB;  Service: Cardiovascular;  Laterality: N/A;   MULTIPLE TOOTH EXTRACTIONS       PERIPHERAL VASCULAR INTERVENTION   05/22/2018    Procedure: PERIPHERAL VASCULAR INTERVENTION;  Surgeon: Serene Gaile ORN, MD;  Location: [facility] INVASIVE CV LAB;  Service: Cardiovascular;;  SMA and Celiac   PILONIDAL CYST EXCISION       POLYPECTOMY       THROMBECTOMY FEMORAL ARTERY Right 08/14/2017    Procedure: THROMBECTOMY FEMORAL ARTERY;  Surgeon: Oris Krystal FALCON, MD;  Location: [facility] OR;  Service: Vascular;  Laterality: Right;   TONSILLECTOMY       TOTAL HIP ARTHROPLASTY   09/11/2015    Procedure: TOTAL HIP ARTHROPLASTY;  Surgeon: Evalene JONETTA Chancy, MD;  Location: [facility] OR;  Service: Orthopedics;;   ULTRASOUND GUIDANCE FOR VASCULAR ACCESS   10/13/2017    Procedure: Ultrasound Guidance For Vascular Access;  Surgeon: Mady Bruckner, MD;  Location: [facility] INVASIVE CV LAB;  Service: Cardiovascular;;   VENTRAL HERNIA REPAIR N/A 10/23/2024    Procedure: REPAIR, HERNIA, VENTRAL, LAPAROSCOPIC;  Surgeon: Rubin Calamity, MD;  Location: [facility] ORS;  Service: General;  Laterality: N/A;   VISCERAL ANGIOGRAPHY N/A 03/03/2020    Procedure: MESTENRIC ANGIOGRAPHY;  Surgeon: Serene Gaile ORN, MD;  Location: [facility] INVASIVE CV LAB;  Service: Cardiovascular;  Laterality: N/A;   VISCERAL ANGIOGRAPHY N/A 09/03/2024    Procedure: VISCERAL ANGIOGRAPHY;  Surgeon: Serene Gaile ORN, MD;  Location: [facility] INVASIVE CV LAB;  Service: Cardiovascular;  Laterality: N/A;   VISCERAL ARTERY INTERVENTION N/A 09/03/2024    Procedure: VISCERAL ARTERY INTERVENTION;  Surgeon: Serene Gaile ORN, MD;  Location: [facility] INVASIVE CV LAB;  Service: Cardiovascular;  Laterality: N/A;          Assessment & Plan Clinical Impression: Patient is a 69 year old female with PMHx of Anxiety, Aortoiliac occlusive disease Arthritis, BPPV, Cardiomyopathy, CAD, Depression, HTN, HLD, T2DM, Hypothyroidism, PAD, Anal cancer-s/p chemo/XRT 2014, s/p mesenteric artery stenting (10/14). The patient presented to [facility] on 10/23/2024 for planned robotic hernia repair by Dr. Redmond. Postoperatively she developed hypotension and acute blood loss anemia. CCM consulted for hemorrhagic shock in the setting of hemoperitoneum and requiring intubation and press and transfer to ICU. On 12/4 she returned back to the OR where no active bleeding was identified but the abdomen was packed and wound  VAC placed. CT angio abdomen/pelvis: Large right intraperitoneal hematoma with active bleeding from the right epigastric artery, left renal artery occluded at the origin with minimal enhancement. Severe narrowing of the SMA immediately distal to the stent which is patent.  On 12/5 she returned to OR where there was no evidence of bleeding and abdomen was closed.    She was transitioned off of sedation but remained encephalopathic. On 12/8 Neurology consulted and patient underwent STAT CT head that revealed interval development of multiple cortical and subcortical edema areas involving bilateral cerebral and cerebellar hemispheres. MRI showed evidence of bilateral strokes with scattered areas of restricted diffusion throughout the cerebral hemispheres and cerebellum concerning for acute infarcts of embolic etiology. An EEG showed continuous slow, generalized. The study is suggestive of generalized cerebral dysfunction (  encephalopathy). No seizures or epileptiform discharges were seen throughout the recording.  Etiology likely profound intracranial hypoperfusion although some initial concern for embolic due to DIC, ultimately ruled out.  Neuro recommended aspirin  81 mg daily. EF 60-65%, carotid doppler without significant stenosis. She was transferred to [facility] ICU on 10/29/24, stabilized in the ICU and extubated on 12/11 an subsequently transferred to [facility] on 12/15. Hospital course complicated by AKI on CKD, Enterobacter cloacae HCAP, dysphagia and malnutrition requiring PEC placement on 12/30, anemia, poorly controlled pain, and neurogenic bowel/bladder. Patient transferred to CIR on 11/25/2024 .    Pt presents with decreased activity tolerance, decreased functional mobility, decreased balance, decreased coordination, decreased awareness and decreased memory, feelings of stress Limiting pt's independence with leisure/community pursuits.  Met with pt & husband today to discuss TR services including leisure education, activity analysis/modifications and stress management.  Pt not feeling well, in pain & c/o nausea.  Nursing aware.  Also discussed the importance of social, emotional, spiritual health in addition to physical health and their effects on overall health and wellness.  Both stated understanding.  Plan  Min 1 TR >20 minutes during LOS  Recommendations for other services: Neuropsych  Discharge Criteria: Patient will be discharged from TR if patient refuses treatment 3 consecutive times without medical reason.  If treatment goals not met, if there is a change in medical status, if patient makes no progress towards goals or if patient is discharged from hospital.  The above assessment, treatment plan, treatment alternatives and goals were discussed and mutually agreed upon: by patient  Onofrio Klemp 11/28/2024, 4:01 PM

## 2024-11-28 NOTE — Progress Notes (Signed)
 Physical Therapy Session Note  Patient Details  Name: Erin [name] MRN: [id number] Date of Birth: [birth year]  Today's Date: 11/28/2024  Short Term Goals: Week 1:  PT Short Term Goal 1 (Week 1): Pt will complete sit to stand with CGA PT Short Term Goal 2 (Week 1): Pt will complete bed to chair with CGA. PT Short Term Goal 3 (Week 1): Pt will ambulate x150' with CGA and LRAD.  Pt missed 45 min of skilled therapy due to reported nausea and feeling unwell. Pt sleeping in room with husband stating pt not feeling well and missing prior OT session due to. PTA did not awaken pt in order for pt to rest. Nsg already aware and awaiting order for nausea medication. Will re-attempt as schedule and pt availability permits.  Therapy Documentation Precautions:  Precautions Precautions: Fall Recall of Precautions/Restrictions: Impaired Precaution/Restrictions Comments: Peg Tube; R-sided hemiparesis Restrictions Weight Bearing Restrictions Per Provider Order: No  Therapy/Group: Individual Therapy  Emerly Prak PTA 11/28/2024, 12:17 PM

## 2024-11-28 NOTE — Plan of Care (Signed)
" °  Problem: Consults Goal: RH STROKE PATIENT EDUCATION Description: See Patient Education module for education specifics  Outcome: Progressing   Problem: RH BOWEL ELIMINATION Goal: RH STG MANAGE BOWEL WITH ASSISTANCE Description: STG Manage Bowel with supervision Assistance. Outcome: Progressing   Problem: RH BLADDER ELIMINATION Goal: RH STG MANAGE BLADDER WITH ASSISTANCE Description: STG Manage Bladder With supervision Assistance Outcome: Progressing   Problem: RH SKIN INTEGRITY Goal: RH STG SKIN FREE OF INFECTION/BREAKDOWN Description: Manage skin free of infection with supervision assistance Outcome: Progressing   Problem: RH SAFETY Goal: RH STG ADHERE TO SAFETY PRECAUTIONS W/ASSISTANCE/DEVICE Description: STG Adhere to Safety Precautions With Assistance/Device. Outcome: Progressing   Problem: RH PAIN MANAGEMENT Goal: RH STG PAIN MANAGED AT OR BELOW PT'S PAIN GOAL Description: <4 w/ prns Outcome: Progressing   Problem: RH KNOWLEDGE DEFICIT Goal: RH STG INCREASE KNOWLEDGE OF HYPERTENSION Description: Manage increase knowledge of hypertension with supervision assistance from husband using educational materials provided Outcome: Progressing Goal: RH STG INCREASE KNOWLEDGE OF DYSPHAGIA/FLUID INTAKE Description: Manage increase knowledge of dysphagia with supervision assistance from husband using educational materials provided Outcome: Progressing Goal: RH STG INCREASE KNOWLEDGE OF STROKE PROPHYLAXIS Description: Manage increase knowledge of stroke prophylaxis with supervision assistance from husband using educational materials provided Outcome: Progressing   "

## 2024-11-28 NOTE — IPOC Note (Signed)
 Overall Plan of Care [facility]) Patient Details Name: Erin [name] MRN: [id number] DOB: February 13, 1956  Admitting Diagnosis: CVA (cerebral vascular accident) [facility])  Hospital Problems: Principal Problem:   CVA (cerebral vascular accident) [facility])     Functional Problem List: Nursing Bladder, Bowel, Edema, Endurance, Medication Management, Nutrition, Pain, Safety, Skin Integrity  PT Balance, Endurance, Motor, Pain, Perception, Safety, Sensory  OT Balance, Behavior, Cognition, Endurance, Motor, Nutrition, Pain, Safety  SLP Cognition, Nutrition, Linguistic  TR         Basic ADLs: OT Bathing, Dressing, Toileting     Advanced  ADLs: OT       Transfers: PT Bed Mobility, Bed to Chair, Car, Occupational Psychologist, Research Scientist (life Sciences): PT Ambulation, Stairs     Additional Impairments: OT Fuctional Use of Upper Extremity  SLP Swallowing, Communication, Social Cognition comprehension, expression Problem Solving, Memory  TR      Anticipated Outcomes Item Anticipated Outcome  Self Feeding    Swallowing  supervisionA   Basic self-care  Supervision  Toileting  Supervision   Bathroom Transfers Supervision  Bowel/Bladder  manage bowels with medications/ manage bladder with toileting assistance  Transfers  Supervision  Locomotion  Supervision  Communication  supervisionA  Cognition  supervisionA  Pain  <4 w/ prns  Safety/Judgment  manage safety with supervision assistance   Therapy Plan: PT Intensity: Minimum of 1-2 x/day ,45 to 90 minutes PT Frequency: 5 out of 7 days PT Duration Estimated Length of Stay: 10-14 Days OT Intensity: Minimum of 1-2 x/day, 45 to 90 minutes OT Frequency: 5 out of 7 days OT Duration/Estimated Length of Stay: ~10-14 days SLP Intensity: Minumum of 1-2 x/day, 30 to 90 minutes SLP Frequency: 3 to 5 out of 7 days SLP Duration/Estimated Length of Stay: 10-14 days   Team Interventions: Nursing Interventions Patient/Family Education,  Bladder Management, Bowel Management, Disease Management/Prevention, Pain Management, Medication Management, Discharge Planning, Dysphagia/Aspiration Precaution Training, Skin Care/Wound Management  PT interventions Ambulation/gait training, Community reintegration, DME/adaptive equipment instruction, Psychosocial support, Neuromuscular re-education, Stair training, UE/LE Strength taining/ROM, Warden/ranger, Discharge planning, Functional electrical stimulation, Skin care/wound management, Therapeutic Activities, UE/LE Coordination activities, Pain management, Cognitive remediation/compensation, Disease management/prevention, Functional mobility training, Patient/family education, Splinting/orthotics, Therapeutic Exercise, Visual/perceptual remediation/compensation  OT Interventions Warden/ranger, Cognitive remediation/compensation, Community reintegration, Discharge planning, Disease mangement/prevention, DME/adaptive equipment instruction, Functional electrical stimulation, Functional mobility training, Neuromuscular re-education, Pain management, Patient/family education, Self Care/advanced ADL retraining, Skin care/wound managment, Psychosocial support, UE/LE Coordination activities, Therapeutic Exercise, UE/LE Strength taining/ROM, Therapeutic Activities  SLP Interventions Cognitive remediation/compensation, Cueing hierarchy, Dysphagia/aspiration precaution training, Functional tasks, Internal/external aids, Multimodal communication approach, Patient/family education, Speech/Language facilitation, Therapeutic Activities  TR Interventions    SW/CM Interventions Discharge Planning, Psychosocial Support, Patient/Family Education   Barriers to Discharge MD  Medical stability, Home enviroment access/loayout, Wound care, Insurance for SNF coverage, and Behavior  Nursing Decreased caregiver support, Home environment access/layout, Wound Care, Incontinence, Nutrition  means Discharge: House  Discharge Home Layout: One level  Discharge Home Access: Stairs to enter  Entrance Stairs-Rails: None  Entrance Stairs-Number of Steps: 1  PT      OT Nutrition means, Wound Care    SLP      SW Decreased caregiver support, Lack of/limited family support, Community Education Officer for SNF coverage     Team Discharge Planning: Destination: PT-Home ,OT- Home , SLP-Home Projected Follow-up: PT-24 hour supervision/assistance, Outpatient PT, OT-  Outpatient OT, SLP-Home Health SLP, Outpatient SLP, 24 hour supervision/assistance Projected Equipment Needs: PT-To be determined,  OT- To be determined, SLP-None recommended by SLP Equipment Details: PT- , OT-  Patient/family involved in discharge planning: PT- Patient, Family member/caregiver,  OT-Patient, SLP-Patient, Family member/caregiver  MD ELOS: 10-14 days Medical Rehab Prognosis:  Good Assessment: The patient has been admitted for CIR therapies with the diagnosis of CVA. The team will be addressing functional mobility, strength, stamina, balance, safety, adaptive techniques and equipment, self-care, bowel and bladder mgt, patient and caregiver education,  . Goals have been set at supervision PT, OT. Anticipated discharge destination is home.       See Team Conference Notes for weekly updates to the plan of care

## 2024-11-28 NOTE — Progress Notes (Signed)
 Speech Language Pathology Daily Session Note  Patient Details  Name: Erin [name] MRN: [id number] Date of Birth: 12/04/1955  Today's Date: 11/28/2024 SLP Individual Time: 0800-0900 SLP Individual Time Calculation (min): 60 min  Short Term Goals: Week 1: SLP Short Term Goal 1 (Week 1): Patient will name functional items with 90% accuracy given min multimodal A SLP Short Term Goal 2 (Week 1): Patient will answer complex yes/no questions with 80% accuracy given min multimodal A SLP Short Term Goal 3 (Week 1): Patient will utilize swallowing compensatory strategies during consumption of least restrictive diet given min verbal A SLP Short Term Goal 4 (Week 1): Patient will demonstrate problem solving skills during functional scenarios given min multimodal A SLP Short Term Goal 5 (Week 1): Patient will recall daily information given min multimodal A  Skilled Therapeutic Interventions:   Pt and husband greeted at bedside for tx targeting cognition and communication. She appeared fatigued and initiated less responses than previous date, but was agreeable to tx tasks. Of note, she required encouragement to complete tasks such as repositioning on her own vs request her husband/SLPs assistance. This was reduced as compared to prev tx session. SLP reviewed orientation info and then facilitated written expression task re date and biographical info. She benefited from minA d/t transposition of letters/numbers. Pt then completed mildly complex responsive naming task (food/drink and animals) w/ modA overall for word finding. Initially required only minA, though modA required as task progressed. Do anticipate some malingering may be present d/t variability of success between responses and day to day. Will continue to monitor. At the end of tx tasks, she was left in bed w/ the alarm set and call light within reach. Recommend cont ST per POC.      Pain  None reported  Therapy/Group: Individual  Therapy  Recardo DELENA Mole 11/28/2024, 8:24 AM

## 2024-11-28 NOTE — Progress Notes (Signed)
 Brief Nutrition Note   Pt reports nausea today which has impacted ability to participate in therapy today and eat meals. Pt states she ate close to 50% of breakfast but not quite so she then received bolus which led to nausea development. Discussed that bolus regimen is meant to help pt keep energy levels up while participating in therapy and should only be used as needed. Encouraged pt to refuse bolus feeds if she feels too full from meal and discussed the meal completion stipulation with nursing to allow some give or take. Encouraged pt to advocate for self and use bolus feeds as needed when she misses meals or feels too fatigued for meals.  Pt wants to continue boluses as ordered for now and pt and husband will work on advocating to refuse bolus if pt is full. Will continue calorie count to assess if bolus feeds can be discontinued all together as pt's appetite has improved once she had a bowel movement.  INTERVENTION:  Continue bolus feeds via PEG TID following meals if meal completion < 50% Osmolite 1.5: 1 carton TID following meals (0800, 1300, 2000) ProSource TF 20 60 ml daily FWF 200 ml q6h Provides 1065 kcals per day (71% estimated needs), 64g protein (98% estimated needs), 543 ml free water  (TF + FWF= daily)   Encouraged adequate intake of meals to meet calorie and protein needs Magic cup TID with meals, each supplement provides 290 kcal and 9 grams of protein Pt does not like any other ONS Calorie count to assess PO intake     Erin Glance, MS, RDN, LDN Clinical Dietitian I Please reach out via secure chat

## 2024-11-28 NOTE — Progress Notes (Signed)
 Occupational Therapy Note  Patient Details  Name: Erin [name] MRN: [id number] Date of Birth: 1955/11/26  Today's Date: 11/28/2024 OT Missed Time: 45 Minutes Missed Time Reason: Other (comment) (n/v)  Pt missed 45 mins skilled OT services 2/2 nausea/vomiting. Pt in bed with husband present. Nursing staff aware of pt's status. Will attempt to see pt again as schedules allow.   Maritza Ned [facility] 11/28/2024, 10:40 AM

## 2024-11-29 LAB — GLUCOSE, CAPILLARY
Glucose-Capillary: 101 mg/dL — ABNORMAL HIGH (ref 70–99)
Glucose-Capillary: 106 mg/dL — ABNORMAL HIGH (ref 70–99)
Glucose-Capillary: 117 mg/dL — ABNORMAL HIGH (ref 70–99)
Glucose-Capillary: 86 mg/dL (ref 70–99)
Glucose-Capillary: 97 mg/dL (ref 70–99)

## 2024-11-29 LAB — HEMOGLOBIN AND HEMATOCRIT, BLOOD
HCT: 26.5 % — ABNORMAL LOW (ref 36.0–46.0)
Hemoglobin: 8.5 g/dL — ABNORMAL LOW (ref 12.0–15.0)

## 2024-11-29 LAB — COMPREHENSIVE METABOLIC PANEL WITH GFR
ALT: 24 U/L (ref 0–44)
AST: 24 U/L (ref 15–41)
Albumin: 3.5 g/dL (ref 3.5–5.0)
Alkaline Phosphatase: 89 U/L (ref 38–126)
Anion gap: 10 (ref 5–15)
BUN: 50 mg/dL — ABNORMAL HIGH (ref 8–23)
CO2: 27 mmol/L (ref 22–32)
Calcium: 9.8 mg/dL (ref 8.9–10.3)
Chloride: 102 mmol/L (ref 98–111)
Creatinine, Ser: 1.99 mg/dL — ABNORMAL HIGH (ref 0.44–1.00)
GFR, Estimated: 27 mL/min — ABNORMAL LOW
Glucose, Bld: 87 mg/dL (ref 70–99)
Potassium: 5 mmol/L (ref 3.5–5.1)
Sodium: 140 mmol/L (ref 135–145)
Total Bilirubin: 0.2 mg/dL (ref 0.0–1.2)
Total Protein: 6.1 g/dL — ABNORMAL LOW (ref 6.5–8.1)

## 2024-11-29 MED ORDER — GABAPENTIN 100 MG PO CAPS
200.0000 mg | ORAL_CAPSULE | Freq: Two times a day (BID) | ORAL | Status: DC
  Administered 2024-11-29 – 2024-12-01 (×5): 200 mg via ORAL
  Filled 2024-11-29 (×6): qty 2

## 2024-11-29 MED ORDER — METHOCARBAMOL 750 MG PO TABS
750.0000 mg | ORAL_TABLET | Freq: Three times a day (TID) | ORAL | Status: DC
  Administered 2024-11-29 – 2024-12-10 (×33): 750 mg via ORAL
  Filled 2024-11-29 (×34): qty 1

## 2024-11-29 MED ORDER — DULOXETINE HCL 20 MG PO CPEP
20.0000 mg | ORAL_CAPSULE | Freq: Every day | ORAL | Status: DC
  Administered 2024-11-29 – 2024-12-10 (×12): 20 mg via ORAL
  Filled 2024-11-29 (×13): qty 1

## 2024-11-29 MED ORDER — SODIUM CHLORIDE 0.9 % IV SOLN: INTRAVENOUS | Status: DC

## 2024-11-29 MED ORDER — SODIUM CHLORIDE 0.9 % IV SOLN: INTRAVENOUS | Status: AC

## 2024-11-29 MED ORDER — ROPINIROLE HCL 1 MG PO TABS
1.0000 mg | ORAL_TABLET | Freq: Every day | ORAL | Status: DC
  Administered 2024-11-29 – 2024-12-09 (×11): 1 mg via ORAL
  Filled 2024-11-29 (×11): qty 1

## 2024-11-29 MED ORDER — SODIUM CHLORIDE 0.9 % IV BOLUS
500.0000 mL | Freq: Once | INTRAVENOUS | Status: AC
  Administered 2024-11-29: 500 mL via INTRAVENOUS

## 2024-11-29 NOTE — Progress Notes (Addendum)
" °                                                         PROGRESS NOTE   Reached out to IR, PA Charles for evaluation of patients bleeding PEG site. They plan to see the patient today.   A FACE TO FACE EVALUATION WAS PERFORMED  Erin [name] 11/29/2024, 11:16 AM     "

## 2024-11-29 NOTE — Plan of Care (Signed)
" °  Problem: RH BOWEL ELIMINATION Goal: RH STG MANAGE BOWEL WITH ASSISTANCE Description: STG Manage Bowel with supervision Assistance. Outcome: Progressing   Problem: RH SKIN INTEGRITY Goal: RH STG SKIN FREE OF INFECTION/BREAKDOWN Description: Manage skin free of infection with supervision assistance Outcome: Progressing   Problem: RH SAFETY Goal: RH STG ADHERE TO SAFETY PRECAUTIONS W/ASSISTANCE/DEVICE Description: STG Adhere to Safety Precautions With Assistance/Device. Outcome: Progressing   "

## 2024-11-29 NOTE — Progress Notes (Signed)
 Physical Therapy Session Note  Patient Details  Name: Erin [name] MRN: [id number] Date of Birth: [birth year]  Today's Date: 11/29/2024 PT Individual Time: 8577-8467 PT Individual Time Calculation (min): 70 min   Short Term Goals: Week 1:  PT Short Term Goal 1 (Week 1): Pt will complete sit to stand with CGA PT Short Term Goal 2 (Week 1): Pt will complete bed to chair with CGA. PT Short Term Goal 3 (Week 1): Pt will ambulate x150' with CGA and LRAD.  Skilled Therapeutic Interventions/Progress Updates:     Pt received semi reclined in bed asleep. PT encourages husband to remain in room during session to assess pt's performance without family present. Pt awakens to verbal stimuli and reports pain in both knees, but says pain in abdomen is improving. PT provides rest breaks and gentle mobility to manage pain. Pt performs supine to sit with cues for positioning at EOB. Pt performs ambulatory transfer to<>from toilet without AD and with modA for stability and safety. WC transport to gym. Pt trials use of RW this session. Pt stands and ambulates x175' with RW, with minA overall, and cues for upright posture ot improve balance, and increasing proximity to RW for safety. Following extended seated rest break, pt ambulates additional x175', this time requiring modA at times due to decreased safety with RW, and routinely running into objects on Rt side with RW. Pt also has unsafe sequencing of attempt at transfers back to [facility], requiring modA and increased cues. Pt perseverates on pain in both knees. Pt ambulates to Nustep with RW and minA and decreased safety awareness. Pt completes Nustep for reciprocal coordination training. Pt completes x4:00 at workload of 3 with average steps per minute ~25. Pt ends activity early due to pain in both knees. Pt performs stand step from Nustep>WC>bed with minA. Left supine in bed with husband applying dicolfenac gel on knees.   Therapy Documentation Precautions:   Precautions Precautions: Fall Recall of Precautions/Restrictions: Impaired Precaution/Restrictions Comments: Peg Tube; R-sided hemiparesis Restrictions Weight Bearing Restrictions Per Provider Order: No    Therapy/Group: Individual Therapy  Elsie JAYSON Dawn, PT, DPT 11/29/2024, 3:49 PM

## 2024-11-29 NOTE — Progress Notes (Signed)
 Occupational Therapy Session Note  Patient Details  Name: Erin [name] MRN: [id number] Date of Birth: 07/01/1956  Today's Date: 11/29/2024 OT Individual Time: 9164-9056 OT Individual Time Calculation (min): 68 min    Short Term Goals: Week 1:  OT Short Term Goal 1 (Week 1): Pt will tolerate >2 mins of standing ADL with supervision + LRAD. OT Short Term Goal 2 (Week 1): Pt will dress LB with CGA + PRN AE. OT Short Term Goal 3 (Week 1): Pt will perform ambulatory toilet transfers with CGA + LRAD. Week 2:     Skilled Therapeutic Interventions/Progress Updates:   1:1 PT received in bed.; Discussed  not wearing the abdominal binder due to PEG site with ongoing bleeding; but PEG tube anchor ordered to help stabilize it. Pt reluctant to getting up but willing. Pt participated in bed mobility with contact guard and min A transfers bed to w/c to toilet to HHA over the shower. Pt bathed overall with min A. Pt had been incontinent of bowel and needed A in the shower to clean up. Pt able to bathe all pther parts with encouragement and cues for sequencing. Pt transferred back into w/c with contact guard. Pt dressing UB with setup and min A for LB; A for threading right LE due to ongoing knee pain. Gel placed on knee. Pt able to don her left sock, min A to don right. PT setup to performed grooming at the sink. OT assisted with blow drying her hair. Encouraged her to sit up in the w/c for nursing to change bandage and PA to come and assess bleeding PEG site.  (PEG was fully covered in the shower).  Pt left sitting up with husband at her side.   Therapy Documentation Precautions:  Precautions Precautions: Fall Recall of Precautions/Restrictions: Impaired Precaution/Restrictions Comments: Peg Tube; R-sided hemiparesis Restrictions Weight Bearing Restrictions Per Provider Order: No General:   Vital Signs: Therapy Vitals Temp: 98.8 F (37.1 C) Temp Source: Oral Pulse Rate: 77 Resp: 18 BP: (!)  112/49 Patient Position (if appropriate): Lying Oxygen Therapy SpO2: 99 % O2 Device: Room Air Pain: Ongoing pain in right LE/ knee - apply for rest breaks as needed and A for LB dressing on that side    Therapy/Group: Individual Therapy  Erin [name] [facility] 11/29/2024, 1:20 PM

## 2024-11-29 NOTE — Progress Notes (Signed)
" °  IR BRIEF PROGRESS NOTE:  IR was consulted with concerns for bleeding from G-tube incision site. Patient received g-tube in IR on 12/30 by Dr. Philip. History of ventral hernia repair on 12/3 with subsequent complications requiring surgical revision, intubation, and CVA. Discharged to Rehab on 1/5.   Per Daphne Finders, NP, patient experienced sufficient bleeding overnight from G-tube incision site to soak through dressing and into patient's shirt. Last bolus of Heparin  was given @ 06:16 this am, and heparin  was subsequently held. Patient remains on aspirin .  On examination, an IV dressing is in place under G-tube bumper guard, with some blood, not soaked. No acute concerns for acute bleeding, fluctuence, bruits, pseudoaneurysm. Patient remains with generalized abdominal tenderness, though worse at midline site of recent hernia revision incision, near G-tube incision site. Patient, her husband, and nursing staff deny a traumatic pull on G-tube.   Reviewed case with IR attending, Dr. Luverne. Patient is sufficiently removed from G-tube placement that there should not be an acute concern for bleeding. At this time, no role for re-imaging. Dr. Luverne recommends conservative management with observation for continued improvement from bleeding since Heparin  was stopped. Please continue to hold Heparin  until Care team is able to re-evaluate tomorrow. If holding Heparin  resolve the bleeding concern, IR recommends continuing to hold heparin , but defers how long to hold it for to Care team.   Please contact IR with any further concerns. IR remains available.    Electronically Signed: Carlin DELENA Griffon, PA-C 11/29/2024, 2:55 PM      "

## 2024-11-29 NOTE — Plan of Care (Signed)
" °  Problem: Consults Goal: RH STROKE PATIENT EDUCATION Description: See Patient Education module for education specifics  Outcome: Progressing   Problem: RH BOWEL ELIMINATION Goal: RH STG MANAGE BOWEL WITH ASSISTANCE Description: STG Manage Bowel with supervision Assistance. Outcome: Progressing   Problem: RH BLADDER ELIMINATION Goal: RH STG MANAGE BLADDER WITH ASSISTANCE Description: STG Manage Bladder With supervision Assistance Outcome: Progressing   Problem: RH SKIN INTEGRITY Goal: RH STG SKIN FREE OF INFECTION/BREAKDOWN Description: Manage skin free of infection with supervision assistance Outcome: Progressing   Problem: RH SAFETY Goal: RH STG ADHERE TO SAFETY PRECAUTIONS W/ASSISTANCE/DEVICE Description: STG Adhere to Safety Precautions With Assistance/Device. Outcome: Progressing   Problem: RH PAIN MANAGEMENT Goal: RH STG PAIN MANAGED AT OR BELOW PT'S PAIN GOAL Description: <4 w/ prns Outcome: Progressing   Problem: RH KNOWLEDGE DEFICIT Goal: RH STG INCREASE KNOWLEDGE OF HYPERTENSION Description: Manage increase knowledge of hypertension with supervision assistance from husband using educational materials provided Outcome: Progressing Goal: RH STG INCREASE KNOWLEDGE OF DYSPHAGIA/FLUID INTAKE Description: Manage increase knowledge of dysphagia with supervision assistance from husband using educational materials provided Outcome: Progressing Goal: RH STG INCREASE KNOWLEDGE OF STROKE PROPHYLAXIS Description: Manage increase knowledge of stroke prophylaxis with supervision assistance from husband using educational materials provided Outcome: Progressing   "

## 2024-11-29 NOTE — Progress Notes (Signed)
 "                                                        PROGRESS NOTE   Subjective/Complaints: No events overnight.  Vital stable.   Blood sugars well-controlled.   Medium BM this AM AKI on labs this a.m., creatinine from 1.6-1.9, BUN 40.  P.o. fluid intakes 460 cc yesterday   Some increased bleeding from the PEG tube site overnight, IR notified, assisting with assessment today.  Nausea and appetite are improved per husband.  Ate a good amount of breakfast morning.   ROS: Denies fevers, chills, N/V, abdominal pain, constipation, diarrhea, SOB, cough, chest pain, new weakness or paraesthesias.   + Nausea, abdominal pain, constipation--improving + R knee pain--now bilateral knee/quad pain + Lethargy--ongoing, improving  Objective:   No results found.  Recent Labs    11/28/24 0524  WBC 5.9  HGB 8.4*  HCT 25.6*  PLT 233   Recent Labs    11/28/24 0524 11/29/24 0543  NA 138 140  K 4.6 5.0  CL 102 102  CO2 27 27  GLUCOSE 91 87  BUN 40* 50*  CREATININE 1.64* 1.99*  CALCIUM  9.5 9.8    Intake/Output Summary (Last 24 hours) at 11/29/2024 0934 Last data filed at 11/29/2024 0616 Gross per 24 hour  Intake 1140 ml  Output --  Net 1140 ml        Physical Exam: Vital Signs Blood pressure (!) 156/64, pulse 68, temperature 98.4 F (36.9 C), resp. rate 17, height 5' 3 (1.6 m), weight 44.2 kg, SpO2 98%.   Constitutional: No apparent distress.  Laying in bed. HENT:  Atraumatic, normocephalic. + Edentulous lower teeth    Eyes: PERRLA. EOMI. Visual fields grossly intact  Cardiovascular: RRR, no murmurs/rub/gallops. No Edema. Peripheral pulses 2+  Respiratory: CTAB. No rales, rhonchi, or wheezing. On RA.  Abdomen: + bowel sounds, hypoactive. NO distention , + tenderness around PEG/Hernia site. No blood on exam but dressing just changed.   Skin:   - hernia repair site with steri strips, healing well, well-approximated - PEG site covered in gauze,clean  MSK:      No  apparent deformity. + TTP in the bilateral knees and proximal thighs now, R>L. No deformity.        Neurologic exam:  Cognition: AAO to person, place, and time.  Complicated by fatigue.  Follows commands appropriately. Language: Mild dysarthria. +speech apraxia, difficulty wordfinding, and substitutions--improving daily Memory: Poor, +distractable--much improved Insight: Poor insight into current condition.  Mood: Pleasant affect, appropriate mood.  Sensation: To light touch intact in BL UEs and LEs   Reflexes: 2+ in BL UE; 1+ RLE, 3+ LLE 1-2 beats clonus LLE with ankle jerk. + Hoffman's LUE.  CN: 2-12 grossly intact.   Coordination: BL UE and LE ataxia, slightly worse on the left; c/b motor apraxia.  Spasticity: MAS 0 in all extremities.        Strength: 4/ 5 proximal left upper and lower extremity, otherwise 5-/5 throughout       Assessment/Plan: 1. Functional deficits which require 3+ hours per day of interdisciplinary therapy in a comprehensive inpatient rehab setting. Physiatrist is providing close team supervision and 24 hour management of active medical problems listed below. Physiatrist and rehab team continue to assess barriers to discharge/monitor patient progress toward  functional and medical goals  Care Tool:  Bathing    Body parts bathed by patient: Right arm, Chest, Abdomen, Front perineal area, Right upper leg, Left upper leg   Body parts bathed by helper: Buttocks, Left arm, Right lower leg, Left lower leg     Bathing assist Assist Level: Minimal Assistance - Patient > 75%     Upper Body Dressing/Undressing Upper body dressing   What is the patient wearing?: Pull over shirt    Upper body assist Assist Level: Minimal Assistance - Patient > 75%    Lower Body Dressing/Undressing Lower body dressing      What is the patient wearing?: Pants     Lower body assist Assist for lower body dressing: Minimal Assistance - Patient > 75%      Toileting Toileting    Toileting assist Assist for toileting: Minimal Assistance - Patient > 75%     Transfers Chair/bed transfer  Transfers assist     Chair/bed transfer assist level: Minimal Assistance - Patient > 75%     Locomotion Ambulation   Ambulation assist      Assist level: 2 helpers Assistive device: No Device Max distance: 130'   Walk 10 feet activity   Assist     Assist level: Minimal Assistance - Patient > 75% Assistive device: No Device   Walk 50 feet activity   Assist    Assist level: 2 helpers Assistive device: No Device    Walk 150 feet activity   Assist Walk 150 feet activity did not occur: Safety/medical concerns         Walk 10 feet on uneven surface  activity   Assist     Assist level: Minimal Assistance - Patient > 75%     Wheelchair     Assist Is the patient using a wheelchair?: Yes Type of Wheelchair: Manual    Wheelchair assist level: Dependent - Patient 0% Max wheelchair distance: 150'    Wheelchair 50 feet with 2 turns activity    Assist        Assist Level: Dependent - Patient 0%   Wheelchair 150 feet activity     Assist      Assist Level: Dependent - Patient 0%   Blood pressure (!) 156/64, pulse 68, temperature 98.4 F (36.9 C), resp. rate 17, height 5' 3 (1.6 m), weight 44.2 kg, SpO2 98%.   Medical Problem List and Plan: 1. 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  Antithrombotics: -DVT/anticoagulation:  Mechanical: Sequential compression devices, below knee Bilateral lower extremities Pharmaceutical: sq Heparin  5000 q8             -antiplatelet therapy: Asprin 81 mg daily    1-9: DC heparin  given bleeding around PEG site.  H&H stable.  SCDs ordered, teds when out of bed.   3. Pain  Management: history of peripheral neuropathy Gabapentin  cut back to 100 mg q12 d/t encephalopathy.   PRN tylenol  and tramadol .  -1/6: Right knee pain for approximately 1 week, will get x-ray, Voltaren  gel 4 times daily, depending on findings may need injection--xray with Minimal tibial spine spur formation. Mild anterior patellar enthesophyte formation at the quadriceps tendon insertion. - 1/7: Robaxin   500 mg TID for muscle cramping in quads  1-9: Significant bilateral lower extremity sensitivity/pain.  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  - Increase gabapentin  to 200 mg twice daily; hesitant to increase further given AKI  - Resume Requip  1 mg nightly  - Resume duloxetine  20 mg daily  - Will increase Robaxin  to 750 mg 3 times daily--hesitant to switch to home tizanidine given risk of sedation but may consider this in the future   4. Mood/Behavior/Sleep: LCSW to follow for evaluation and support when available.              -antipsychotic agents: N/A             -Continue Ritalin  5 mg a.m. to encourage alertness     1-8: Mirtazapine  7.5 mg nightly added yesterday for sleep and appetite stimulation; patient overly lethargic today, will DC mirtazapine  to see if this improves  - DC mirtazapine  due to lethargy, nausea.  Symptoms significantly improved.  5. Neuropsych/cognition: This patient is capable of making decisions on her own behalf.   6. Skin/Wound Care: Routine pressure relief measures.   - Surgical sites look good   7. Fluids/Electrolytes/Nutrition/dysphagia: Monitor I&O and weight. Follow up labs CBC/CMP               Oropharyngeal dysphagia: Dysphagia: Secondary to CVA/anoxic injury.  -PEG placed on 12/30-- -Pt to receive a can of osmolite if eats less than 50% of meal -continue protein supp and free water  -consider appetite stimulant  1/6: Severe  constipation contributor to nausea and poor meal intake; nutrition consult today.  Treating constipation as below.  1-7: Converting to bolus feeds if less than 50% meal completion via PEG tube.  Hopeful for p.o. improvement now that she has had a bowel movement  1-9: Appetite doing a little better today.  Continue current regimen.   8. Incisional hernia repair reexploration 12/4 and 12/5 c/b hematoma:  -Gen Surgery continuing to follow as needed.    9. Acute bilateral cerebellar/cerebral infarct secondary to profound intracranial hypoperfusion due to hemorrhagic shock/hypotension:  -Continue Asprin. Follow up with [facility] outpatient.    10. Acute metabolic encephalopathy: seems much improved, continue to monitor-delirium precautions.    11. ABLA: secondary to hemorrhagic shock/critical illness. Hemoglobin 8.8 (12/31). Monitor CBC.    - 1/6: hemoglobin stable on admission labs, add daily iron supplement 325 mg to assist in RBC repletion.  Add on iron levels and TIBC to labs today--WNL  1-8: DC iron supplement due to stomach upset, nausea  1-9: Bleeding around PEG tube site today.  H&H stable 8.5.  IR evaluated, appreciate recommendations.  Patient has abdominal binder to keep PEG in place when out of bed.   12. AKI on CKD stage IIIb: continue to monitor renal function.  -Right renal infarct: incidental finding on CT imaging on 12/4-likely due to hypoperfusion in setting of hypotension. Continue supportive care.    - Creatinine stable 1.5, BUN remains elevated 42, encourage p.o. fluids.   1-8: Creatinine 1.6 today, BUN 40.  Discussed need to eat fluid intake with patient.  Depending on I's and O's today, may give IV fluids tomorrow.  1-9: Creatinine 1.9, 500 cc fluid bolus followed by 50 cc/h normal saline for 1 day.  BMP in AM.  Discussed increasing p.o. fluids with patient, husband.  14. HCAP (Enterobacter cloacae on tracheal aspirate culture): Completed antibiotic course.  15. Hypertension:  monitor BP per protocol on Amlodipine  10 mg, Coreg  12.5 mg, and Hydralazine  50 mg.    - Blood pressure stable, monitor    11/29/2024    4:33 AM 11/29/2024    4:32 AM 11/28/2024    8:03 PM  Vitals with BMI  Weight 97 lbs 7 oz    BMI 17.27    Systolic  156 126  Diastolic  64 57  Pulse  68 73    16. Hypothyrodism: continue Synthroid .    17. 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 Serene)             -on Aspirin     18.  Severe constipation status post multiple intra-abdominal surgeries.  - Last bowel movement 12/28  - KUB today with large stool burden--no signs of obstruction--gave sorbitol  30 mL  Large bowel movement 1-7 with sorbitol ; increase MiraLAX  to twice daily and monitor  Last BM 1-9  19.  Nausea.  Encouraged use of as needed Zofran .  On Protonix .,  Will increase to twice daily AC.  - 1-9: Improved.  CMP stable with exception of AKI, see #12  LOS: 4 days A FACE TO FACE EVALUATION WAS PERFORMED  Joesph JAYSON Likes 11/29/2024, 9:34 AM     "

## 2024-11-29 NOTE — Progress Notes (Signed)
 Speech Language Pathology Daily Session Note  Patient Details  Name: Erin [name] MRN: [id number] Date of Birth: 10/28/1956  Today's Date: 11/29/2024 SLP Individual Time: 1012-1100 SLP Individual Time Calculation (min): 48 min and Today's Date: 11/29/2024 SLP Missed Time: 12 Minutes Missed Time Reason: Nursing care  Short Term Goals: Week 1: SLP Short Term Goal 1 (Week 1): Patient will name functional items with 90% accuracy given min multimodal A SLP Short Term Goal 2 (Week 1): Patient will answer complex yes/no questions with 80% accuracy given min multimodal A SLP Short Term Goal 3 (Week 1): Patient will utilize swallowing compensatory strategies during consumption of least restrictive diet given min verbal A SLP Short Term Goal 4 (Week 1): Patient will demonstrate problem solving skills during functional scenarios given min multimodal A SLP Short Term Goal 5 (Week 1): Patient will recall daily information given min multimodal A  Skilled Therapeutic Interventions:   Pt and her husband greeted at bedside for tx targeting cognition and communication. She was awake upon SLP arrival, but kept her eyes closed and did not initiate a verbal greeting. During initial conversation, she benefited from [facility] for sentence formulation and word finding. Of note, 12 mins was missed d/t IV placement. Upon SLP return, she completed object description task via partial semantic feature analysis. She benefited from maxA for word finding. She also completed a concrete generative naming task (3 items) w/ modA. She benefited from semantic cues, sentence completion cues,and phonemic cues to initiate responses. Throughout verbal tasks, she presented w/ consistent prolongations of initial part of the word. Errors mostly comprised of phonemic paraphasias. Also appeared to present w/ groping, which is new this date. She then completed a written task targeting categorization, sustained attention, organization, and writing.  She required only minA to sort the items from a fo 2, but required maxA to write the words. Pt claimed it was bc she didn't have her glasses, however, pt completed all written expression tasks thus far w/o her glasses.   She appeared to present w/ limited effort overall this date. When questioned about it, pt denied fatigue, extreme pain, or negative emotions. Success remains variable day to day for unknown reasons. SLP provided brief education and encouragement re hope for consistent success in upcoming tx sessions, though pt and husband will benefit from additional education. She was left in bed w/ the alarm set and call light within reach. Recommend cont ST per POC.     Pain Pain Assessment Pain Scale: 0-10 Pain Score: 2   Therapy/Group: Individual Therapy  Recardo DELENA Mole 11/29/2024, 10:22 AM

## 2024-11-29 NOTE — Progress Notes (Signed)
 Calorie Count Note  48 hour calorie count ordered. Pt appears more awake today and more energetic. Pt states she feels much better after her shower and washing her hair. Pt sitting up in bed eating lunch. No nausea to report and pt did well with breakfast this morning. Calorie count results show an average intake that meets 84% of calorie needs and 34% protein needs. PO intake has significantly improved now that pt has had bowel movements. Suspect pt will continue to meet needs with PO intake as long as she continues to have regular bowel movements and nausea remains managed. Report of bleeding around PEG site. Plan to discontinue bolus feeds as pt no longer needs them to meet nutrition needs. Pt and husband agreeable to discontinuing feeds at this time. Would like to find acceptable protein supplement to help increase pt's PO protein intake but so far pt does not like any supplements offered. Will continue to monitor intake and adjust recommendations as needed.    Diet: regular Supplements: magic cups  Estimated Nutritional Needs:  Kcal:  1500-1700 Protein:  65-85g   Day 1: 1/7 Breakfast: 50% 3 pancakes, 100% cola (575 kcal, 3.5 g protein)  Lunch: 90% spaghetti w/ meat sauce, 100% cola (251 kcal, 7 g protein) Dinner: 25% pasta primavera w/ chicken, peas, roll, brownie; 100% cola (259 kcal, 11 g protein) Supplements: no magic cup intake  Day 2: 1/8 Breakfast: 50% 3 pancakes, 100% cola (575 kcal, 3.5 g protein)  Lunch: 85% pot roast w/ gravy, cookie; 100% cola (584 kcal, 19 g protein) Dinner: 25% beef stew, baked potato, cookie x 2; 100% cola (289 kcal, 5.5) Supplements: no magic cups   Average total intake: 1267 kcal (84% of minimum estimated needs)  25 protein (38% of minimum estimated needs)  INTERVENTION:  Discontinue bolus feeds Pt's appetite has progressed well and no longer indicated she needs bolus feeds  Encouraged adequate intake of meals to meet calorie and protein  needs Magic cup TID with meals, each supplement provides 290 kcal and 9 grams of protein Pt does not like any other ONS   Will continue to monitor intake and adjust recommendations as needed Would benefit from high kcal, high protein education prior to discharge (added on AVS)   NUTRITION DIAGNOSIS:    Severe Malnutrition related to chronic illness as evidenced by severe fat depletion, severe muscle depletion. Remains applicable  GOAL:    Patient will meet greater than or equal to 90% of their needs Progressing    Josette Glance, MS, RDN, LDN Clinical Dietitian I Please reach out via secure chat]

## 2024-11-30 DIAGNOSIS — K5901 Slow transit constipation: Secondary | ICD-10-CM

## 2024-11-30 DIAGNOSIS — M792 Neuralgia and neuritis, unspecified: Secondary | ICD-10-CM

## 2024-11-30 LAB — GLUCOSE, CAPILLARY
Glucose-Capillary: 100 mg/dL — ABNORMAL HIGH (ref 70–99)
Glucose-Capillary: 110 mg/dL — ABNORMAL HIGH (ref 70–99)
Glucose-Capillary: 102 mg/dL — ABNORMAL HIGH (ref 70–99)
Glucose-Capillary: 111 mg/dL — ABNORMAL HIGH (ref 70–99)

## 2024-11-30 LAB — BASIC METABOLIC PANEL WITH GFR
Anion gap: 11 (ref 5–15)
BUN: 43 mg/dL — ABNORMAL HIGH (ref 8–23)
CO2: 24 mmol/L (ref 22–32)
Calcium: 9.4 mg/dL (ref 8.9–10.3)
Chloride: 101 mmol/L (ref 98–111)
Creatinine, Ser: 1.78 mg/dL — ABNORMAL HIGH (ref 0.44–1.00)
GFR, Estimated: 31 mL/min — ABNORMAL LOW
Glucose, Bld: 98 mg/dL (ref 70–99)
Potassium: 4.1 mmol/L (ref 3.5–5.1)
Sodium: 136 mmol/L (ref 135–145)

## 2024-11-30 MED ORDER — LIDOCAINE 5 % EX PTCH
3.0000 | MEDICATED_PATCH | Freq: Every day | CUTANEOUS | Status: DC
  Administered 2024-11-30 – 2024-12-10 (×11): 3 via TRANSDERMAL
  Filled 2024-11-30 (×12): qty 3

## 2024-11-30 MED ORDER — GLUCERNA SHAKE PO LIQD: 237.0000 mL | Freq: Three times a day (TID) | ORAL | Status: DC

## 2024-11-30 NOTE — Plan of Care (Signed)
" °  Problem: Consults Goal: RH STROKE PATIENT EDUCATION Description: See Patient Education module for education specifics  Outcome: Progressing   Problem: RH BOWEL ELIMINATION Goal: RH STG MANAGE BOWEL WITH ASSISTANCE Description: STG Manage Bowel with supervision Assistance. Outcome: Progressing   Problem: RH BLADDER ELIMINATION Goal: RH STG MANAGE BLADDER WITH ASSISTANCE Description: STG Manage Bladder With supervision Assistance Outcome: Progressing   Problem: RH SKIN INTEGRITY Goal: RH STG SKIN FREE OF INFECTION/BREAKDOWN Description: Manage skin free of infection with supervision assistance Outcome: Progressing   Problem: RH SAFETY Goal: RH STG ADHERE TO SAFETY PRECAUTIONS W/ASSISTANCE/DEVICE Description: STG Adhere to Safety Precautions With Assistance/Device. Outcome: Progressing   Problem: RH PAIN MANAGEMENT Goal: RH STG PAIN MANAGED AT OR BELOW PT'S PAIN GOAL Description: <4 w/ prns Outcome: Progressing   Problem: RH KNOWLEDGE DEFICIT Goal: RH STG INCREASE KNOWLEDGE OF HYPERTENSION Description: Manage increase knowledge of hypertension with supervision assistance from husband using educational materials provided Outcome: Progressing Goal: RH STG INCREASE KNOWLEDGE OF DYSPHAGIA/FLUID INTAKE Description: Manage increase knowledge of dysphagia with supervision assistance from husband using educational materials provided Outcome: Progressing Goal: RH STG INCREASE KNOWLEDGE OF STROKE PROPHYLAXIS Description: Manage increase knowledge of stroke prophylaxis with supervision assistance from husband using educational materials provided Outcome: Progressing   "

## 2024-11-30 NOTE — Progress Notes (Signed)
 "   Referring Physician(s): Dr. Brayton Rosser  Supervising Physician: Karalee Beat  Patient Status:  [facility] - In-pt  Chief Complaint:  Hemoperitoneum s.p G tube placement on 12.30.25  Subjective:  Patient resting in bed. Husband at bedside.   Allergies: Latex, Amitiza  [lubiprostone ], Nortriptyline , Sulfa antibiotics, Tramadol , Atorvastatin , and Claritin [loratadine]  Medications: Prior to Admission medications  Medication Sig Start Date End Date Taking? Authorizing Provider  clopidogrel  (PLAVIX ) 75 MG tablet Take 75 mg by mouth daily.   Yes [provider]  DULoxetine  (CYMBALTA ) 20 MG capsule Take 40-60 mg by mouth daily. Take 2-3 capsules daily   Yes [provider]  gabapentin  (NEURONTIN ) 600 MG tablet Take 600 mg by mouth 3 (three) times daily.   Yes [provider]  potassium chloride  SA (KLOR-CON  M) 20 MEQ tablet Take 20 mEq by mouth 2 (two) times daily.   Yes [provider]  rOPINIRole  (REQUIP ) 1 MG tablet Take 1 mg by mouth at bedtime.   Yes [provider]  tiZANidine (ZANAFLEX) 4 MG tablet Take 2 mg by mouth 3 (three) times daily.   Yes [provider]  topiramate  (TOPAMAX ) 50 MG tablet Take 50 mg by mouth 2 (two) times daily.   Yes [provider]  acetaminophen  (TYLENOL ) 500 MG tablet Take 1,000 mg by mouth every 6 (six) hours as needed for headache (pain).    [provider]  acyclovir  ointment (ZOVIRAX ) 5 % Apply 1 Application topically every 3 (three) hours as needed (fever blisters). 10/24/22   [name] HERO, MD  amLODipine  (NORVASC ) 5 MG tablet Take 10 mg by mouth daily. 09/12/24   [provider]  aspirin  EC 81 MG tablet Take 1 tablet (81 mg total) by mouth daily. Swallow whole. 06/19/24   Lelon Hamilton T, PA-C  bisacodyl  (DULCOLAX) 10 MG suppository Place 1 suppository (10 mg total) rectally daily as needed for moderate constipation. 11/25/24   Ghimire, Donalda HERO, MD  carvedilol   (COREG ) 12.5 MG tablet Take 12.5 mg by mouth 2 (two) times daily. 09/12/24   [provider]  Cholecalciferol (VITAMIN D ) 2000 units tablet Take 2,000 Units by mouth daily.    [provider]  fluticasone  (FLONASE ) 50 MCG/ACT nasal spray SPRAY 1 SPRAY INTO EACH NOSTRIL DAILY 02/17/24   [name] HERO, MD  gabapentin  (NEURONTIN ) 250 MG/5ML solution Place 2 mLs (100 mg total) into feeding tube every 12 (twelve) hours. 11/25/24   Ghimire, Donalda HERO, MD  hydrALAZINE  (APRESOLINE ) 50 MG tablet Take 1 tablet (50 mg total) by mouth every 8 (eight) hours. 11/25/24   Ghimire, Donalda HERO, MD  ketoconazole  (NIZORAL ) 2 % shampoo Apply to hair daily for 7 days -- leave on for 5 minutes, then wash every other day for 7 days, then wash twice a week for 7 days,  then once a week. 04/01/24   [name] HERO, MD  levothyroxine  (SYNTHROID ) 75 MCG tablet TAKE 1 TABLET BY MOUTH DAILY BEFORE BREAKFAST MONDAY-FRIDAY, THEN 1/2 SATURDAY AND SUNDAY 06/28/24   [name] HERO, MD  meclizine  (ANTIVERT ) 25 MG tablet TAKE 1 TABLET BY MOUTH THREE TIMES A DAY AS NEEDED FOR DIZZINESS 05/01/24   [name] HERO, MD  methylphenidate  (RITALIN ) 5 MG tablet Take 1 tablet (5 mg total) by mouth every morning. 11/26/24   Ghimire, Donalda HERO, MD  metoCLOPramide  (REGLAN ) 5 MG tablet TAKE 1 TABLET BY MOUTH EVERY 6 HOURS AS NEEDED FOR NAUSEA. 08/12/24   [name] HERO, MD  nitroGLYCERIN  (NITROSTAT )  0.4 MG SL tablet Place 1 tablet (0.4 mg total) under the tongue every 5 (five) minutes as needed for chest pain. 04/03/24 10/15/24  End, Lonni, MD  Nutritional Supplements (FEEDING SUPPLEMENT, OSMOLITE 1.5 CAL,) LIQD Place 237 mLs into feeding tube 3 (three) times daily. 11/25/24   Ghimire, Donalda HERO, MD  Nystatin  (GERHARDT'S BUTT CREAM) CREA Apply 1 Application topically 2 (two) times daily. 11/25/24   Ghimire, Donalda HERO, MD  nystatin -triamcinolone  (MYCOLOG II) cream Apply 1 application topically 2 (two) times daily as needed (dry  skin).  03/30/16   [provider]  pantoprazole  (PROTONIX ) 40 MG tablet Take 1 tablet (40 mg total) by mouth daily. 11/25/24   Ghimire, Donalda HERO, MD  polyethylene glycol (MIRALAX  / GLYCOLAX ) 17 g packet Take 17 g by mouth daily. 11/25/24   Ghimire, Donalda HERO, MD  Protein (FEEDING SUPPLEMENT, PROSOURCE UQ79,) liquid Place 60 mLs into feeding tube daily. 11/26/24   Ghimire, Donalda HERO, MD  rosuvastatin  (CRESTOR ) 40 MG tablet Take 1 tablet (40 mg total) by mouth daily. 09/13/24   [name] HERO, MD  senna-docusate (SENOKOT-S) 8.6-50 MG tablet Take 2 tablets by mouth at bedtime. 11/25/24   Ghimire, Donalda HERO, MD  vitamin B-12 1000 MCG tablet Take 1 tablet (1,000 mcg total) by mouth daily. 09/14/15   Dhungel, Barnetta, MD  Water  For Irrigation, Sterile (FREE WATER ) SOLN Place 200 mLs into feeding tube every 6 (six) hours. 11/25/24   Ghimire, Donalda HERO, MD     Vital Signs: BP (!) 146/77 (BP Location: Right Arm)   Pulse 82   Temp 98 F (36.7 C) (Oral)   Resp 16   Ht 5' 3 (1.6 m)   Wt 99 lb 6.8 oz (45.1 kg)   SpO2 98%   BMI 17.61 kg/m   Physical Exam Vitals and nursing note reviewed.  Constitutional:      Appearance: She is well-developed.  HENT:     Head: Normocephalic and atraumatic.  Eyes:     Conjunctiva/sclera: Conjunctivae normal.  Pulmonary:     Effort: Pulmonary effort is normal.  Musculoskeletal:        General: Normal range of motion.     Cervical back: Normal range of motion.  Skin:    General: Skin is warm.  Neurological:     Mental Status: She is alert and oriented to person, place, and time. Mental status is at baseline.     Imaging: DG Knee 1-2 Views Right Result Date: 11/26/2024 CLINICAL DATA:  Acute right knee pain.  No reported injury. EXAM: RIGHT KNEE - 1-2 VIEW COMPARISON:  None Available. FINDINGS: Minimal tibial spine spur formation. Mild anterior patellar enthesophyte formation at the quadriceps tendon insertion. No fracture, dislocation or effusion.  Vascular clips are noted. IMPRESSION: Minimal degenerative changes. Electronically Signed   By: Elspeth Bathe M.D.   On: 11/26/2024 16:56    Labs:  CBC: Recent Labs    11/19/24 0401 11/20/24 0356 11/26/24 0530 11/28/24 0524 11/29/24 1234  WBC 7.1 8.2 9.9 5.9  --   HGB 9.3* 8.8* 8.9* 8.4* 8.5*  HCT 28.7* 26.8* 27.0* 25.6* 26.5*  PLT 288 269 218 233  --     COAGS: Recent Labs    10/24/24 0610 10/24/24 1500 10/24/24 2151 10/25/24 0507 10/29/24 0530 11/15/24 0525 11/19/24 0401  INR 1.4* 1.2 1.3* 1.3* 1.1 1.0 0.9  APTT 35 32 34 36  --   --   --     BMP: Recent Labs  11/26/24 0530 11/28/24 0524 11/29/24 0543 11/30/24 0551  NA 134* 138 140 136  K 4.4 4.6 5.0 4.1  CL 98 102 102 101  CO2 26 27 27 24   GLUCOSE 101* 91 87 98  BUN 42* 40* 50* 43*  CALCIUM  10.0 9.5 9.8 9.4  CREATININE 1.55* 1.64* 1.99* 1.78*  GFRNONAA 36* 34* 27* 31*    LIVER FUNCTION TESTS: Recent Labs    11/12/24 0327 11/13/24 0155 11/26/24 0530 11/29/24 0543  BILITOT 0.3 0.2 0.3 0.2  AST 16 22 23 24   ALT 10 13 22 24   ALKPHOS 88 91 95 89  PROT 6.1* 6.3* 6.4* 6.1*  ALBUMIN  3.5 3.6 3.7 3.5    Assessment and Plan:  69 y.o. female. Inpatient. History of HTN, HLD, CAD, Cardiomyopathy, PVD, DM, GERD, IBS, anal cancer.elective hernia repair on 12.3.25 hospital course complicated by hemoperitoneum on 12.4.25 and multiple cortical and subcortical edema suggestive of embolic infarcts.  S/p 20 fr pull though gastrostomy tube placement. Found to have bleeding around the exit site. Insertion site is clean, dry, dressed and appropriately tender to palpation.Per RN at bedside gastrostomy tube is functional.. No bleeding or leakage. Patient had one episode of vomiting at shift change. Hgb from 1.9.26 -  8.5    Patient to stable from IR perspective s/p gastrostomy tube placement further plans per primary Team please call IR with questions or concerns.      Electronically Signed: Delon JAYSON Beagle,  NP 11/30/2024, 11:58 AM   I spent a total of 15 Minutes at the patient's bedside AND on the patient's hospital floor or unit, greater than 50% of which was counseling/coordinating care for gastrostomy tube placement      "

## 2024-11-30 NOTE — Progress Notes (Signed)
 Occupational Therapy Note  Patient Details  Name: CALIN ELLERY MRN: [id number] Date of Birth: 09-12-1956  Today's Date: 11/30/2024 OT Missed Time: 60 Minutes Missed Time Reason: Patient ill (comment)  Pt supine in bed with her husband present. She keeps eyes closed and reports ongoing nausea. She declines participation d/t high nausea. Encouraged light participation but she does not feel she can. Got pt a ginger ale per her request. She was left supine with all needs met. Notified RN of continued nausea. 60 min missed.    Nena VEAR Moats 11/30/2024, 10:29 AM

## 2024-11-30 NOTE — Progress Notes (Addendum)
 Occupational Therapy Session Note  Patient Details  Name: Erin [name] MRN: [id number] Date of Birth: Dec 28, 1955  Today's Date: 11/30/2024 OT Individual Time: 8741-8656 OT Individual Time Calculation (min): 45 min    Short Term Goals: Week 1:  OT Short Term Goal 1 (Week 1): Pt will tolerate >2 mins of standing ADL with supervision + LRAD. OT Short Term Goal 2 (Week 1): Pt will dress LB with CGA + PRN AE. OT Short Term Goal 3 (Week 1): Pt will perform ambulatory toilet transfers with CGA + LRAD.  Skilled Therapeutic Interventions/Progress Updates:   Patient received supine in bed - eyes closed.  Patient not sleeping - easily opens eyes.  Reports series of events from earlier - but reports feeling a bit better.  Patient reluctantly agreeable to sit at edge of bed.  Nutrition delivered jello and patient very excited to eat jello while sitting edge of bed.  Initially patient needing mod assist - with STRONG posterior preference.  Once feet on floor and engaged in eating jello sat with CGA to supervision.  Patient ate three small containers of jello, then was willing to take a short walk into hallway.  Patient walking with forward flexion at fast speed.  Patient needing cueing to stand up straight and slow down.   Patient quickly fatigued and returned to bed.  Patient denies need to void. Patient left in bed lying on left side.  Bed alarm engaged and call bell and personal items in reach.  Missed 45 scheduled minutes due to fatigue.  Therapy Documentation Precautions:  Precautions Precautions: Fall Recall of Precautions/Restrictions: Impaired Precaution/Restrictions Comments: Peg Tube; R-sided hemiparesis Restrictions Weight Bearing Restrictions Per Provider Order: No  Pain: Pain Assessment Pain Scale: 0-10 Pain Score: 6  Faces Pain Scale: Hurts even more Pain Type: Acute pain Pain Location: Head Pain Intervention(s): Medication (See eMAR)    Therapy/Group: Individual  Therapy  [name] 11/30/2024, 1:46 PM

## 2024-11-30 NOTE — Progress Notes (Addendum)
 Patient had 2nd vomiting episode; green . Notified Dr, Babs, Vital signs stable- see flowsheet. PRN IV Zofran  given per MD. Notified oncoming nurse. [name].

## 2024-11-30 NOTE — Progress Notes (Signed)
 "                                                        PROGRESS NOTE   Subjective/Complaints: Had a fair night. PEG site without further problems. C/o pain along thighs and knees, very sensitive, impacts ability to do therapy  ROS: Patient denies fever, rash, sore throat, blurred vision, dizziness, nausea, vomiting, diarrhea, cough, shortness of breath or chest pain,  headache, or mood change.   Objective:   No results found.  Recent Labs    11/28/24 0524 11/29/24 1234  WBC 5.9  --   HGB 8.4* 8.5*  HCT 25.6* 26.5*  PLT 233  --    Recent Labs    11/29/24 0543 11/30/24 0551  NA 140 136  K 5.0 4.1  CL 102 101  CO2 27 24  GLUCOSE 87 98  BUN 50* 43*  CREATININE 1.99* 1.78*  CALCIUM  9.8 9.4    Intake/Output Summary (Last 24 hours) at 11/30/2024 0755 Last data filed at 11/30/2024 0754 Gross per 24 hour  Intake 2732.23 ml  Output --  Net 2732.23 ml        Physical Exam: Vital Signs Blood pressure (!) 149/99, pulse 89, temperature 98.1 F (36.7 C), temperature source Oral, resp. rate 18, height 5' 3 (1.6 m), weight 45.1 kg, SpO2 96%.   Constitutional: No distress . Vital signs reviewed. HEENT: NCAT, EOMI, oral membranes moist Neck: supple Cardiovascular: RRR without murmur. No JVD    Respiratory/Chest: CTA Bilaterally without wheezes or rales. Normal effort    GI/Abdomen: BS +, -tender to palpation, non-distended Ext: no clubbing, cyanosis, or edema Psych: pleasant and cooperative  Skin:   - hernia repair site with steri strips, healing well, well-approximated - PEG site covered with gauze, old dried blood MSK:      No apparent deformity. + TTP in the bilateral knees and proximal thighs now, R>L, even with light touch        Neurologic exam:  Cognition: AAO to person, place, and time.   Follows commands appropriately. Language: Mild dysarthria. +speech apraxia, difficulty wordfinding, and substitutions--improving daily Memory: Poor, +distractable--much  improved Insight: Poor insight into current condition.  Mood: Pleasant affect, appropriate mood.  Sensation: To light touch intact in BL UEs and LEs   Reflexes: 2+ in BL UE; 1+ RLE, 3+ LLE 1-2 beats clonus LLE with ankle jerk. + Hoffman's LUE.  CN: 2-12 grossly intact.   Coordination: BL UE and LE ataxia, slightly worse on the left; c/b motor apraxia.  Spasticity: MAS 0 in all extremities.        Strength: 4/ 5 proximal left upper and lower extremity, otherwise 5-/5 throughout--stable 1/10       Assessment/Plan: 1. Functional deficits which require 3+ hours per day of interdisciplinary therapy in a comprehensive inpatient rehab setting. Physiatrist is providing close team supervision and 24 hour management of active medical problems listed below. Physiatrist and rehab team continue to assess barriers to discharge/monitor patient progress toward functional and medical goals  Care Tool:  Bathing    Body parts bathed by patient: Right arm, Chest, Abdomen, Front perineal area, Right upper leg, Left upper leg, Left arm, Right lower leg, Left lower leg, Face   Body parts bathed by helper: Buttocks, Left arm, Right lower leg, Left lower leg  Bathing assist Assist Level: Minimal Assistance - Patient > 75%     Upper Body Dressing/Undressing Upper body dressing   What is the patient wearing?: Pull over shirt    Upper body assist Assist Level: Set up assist    Lower Body Dressing/Undressing Lower body dressing      What is the patient wearing?: Underwear/pull up, Pants     Lower body assist Assist for lower body dressing: Contact Guard/Touching assist     Toileting Toileting    Toileting assist Assist for toileting: Moderate Assistance - Patient 50 - 74%     Transfers Chair/bed transfer  Transfers assist     Chair/bed transfer assist level: Minimal Assistance - Patient > 75%     Locomotion Ambulation   Ambulation assist      Assist level: 2  helpers Assistive device: No Device Max distance: 130'   Walk 10 feet activity   Assist     Assist level: Minimal Assistance - Patient > 75% Assistive device: No Device   Walk 50 feet activity   Assist    Assist level: 2 helpers Assistive device: No Device    Walk 150 feet activity   Assist Walk 150 feet activity did not occur: Safety/medical concerns         Walk 10 feet on uneven surface  activity   Assist     Assist level: Minimal Assistance - Patient > 75%     Wheelchair     Assist Is the patient using a wheelchair?: Yes Type of Wheelchair: Manual    Wheelchair assist level: Dependent - Patient 0% Max wheelchair distance: 150'    Wheelchair 50 feet with 2 turns activity    Assist        Assist Level: Dependent - Patient 0%   Wheelchair 150 feet activity     Assist      Assist Level: Dependent - Patient 0%   Blood pressure (!) 149/99, pulse 89, temperature 98.1 F (36.7 C), temperature source Oral, resp. rate 18, height 5' 3 (1.6 m), weight 45.1 kg, SpO2 96%.   Medical Problem List and Plan: 1. 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  Antithrombotics: -DVT/anticoagulation:  Mechanical: Sequential compression devices, below knee Bilateral lower extremities Pharmaceutical: sq Heparin  5000 q8             -antiplatelet therapy: Asprin 81 mg daily    1/10 continue to hold heparin  given bleeding around PEG site.  H&H stable.  SCDs ordered, teds when out of bed.    -she's moving her legs a lot in bed too 3. Pain Management: history of peripheral neuropathy Gabapentin  cut back to 100 mg q12 d/t encephalopathy.   PRN tylenol  and tramadol .  -1/6: Right knee pain for approximately 1 week, will get x-ray, Voltaren  gel 4 times  daily, depending on findings may need injection--xray with Minimal tibial spine spur formation. Mild anterior patellar enthesophyte formation at the quadriceps tendon insertion. - 1/7: Robaxin  500 mg TID for muscle cramping in quads  1-9: Significant bilateral lower extremity sensitivity/pain.  on chart review, for neuropathy secondary to peripheral artery disease was on gabapentin  600 mg 3 times daily, Requip ,  duloxetine , and tizanidine as outpatient.  - Increase gabapentin  to 200 mg twice daily; hesitant to increase further given AKI  - Resume Requip  1 mg nightly  - Resume duloxetine  20 mg daily  - Will increase Robaxin  to 750 mg 3 times daily--hesitant to switch to home tizanidine given risk of sedation but may consider this in the future   1/10 continued pain. Really can't push gabapentin  any more   -will add lidocaine  patches   -hopefully duloxetine  and requip  will help too 4. Mood/Behavior/Sleep: LCSW to follow for evaluation and support when available.              -antipsychotic agents: N/A             -Continue Ritalin  5 mg a.m. to encourage alertness     1-8: Mirtazapine  7.5 mg nightly added yesterday for sleep and appetite stimulation; patient overly lethargic today, will DC mirtazapine  to see if this improves  - DC'ed mirtazapine  due to lethargy, nausea.  Symptoms significantly improved.  5. Neuropsych/cognition: This patient is capable of making decisions on her own behalf.   6. Skin/Wound Care: Routine pressure relief measures.   - Surgical sites look good   7. Fluids/Electrolytes/Nutrition/dysphagia: Monitor I&O and weight. Follow up labs CBC/CMP               Oropharyngeal dysphagia: Dysphagia: Secondary to CVA/anoxic injury.  -PEG placed on 12/30-- -Pt to receive a can of osmolite if eats less than 50% of meal -continue protein supp and free water  -consider appetite stimulant  1/6: Severe constipation contributor to nausea and poor meal intake; nutrition consult today.   Treating constipation as below.  1-7: Converting to bolus feeds if less than 50% meal completion via PEG tube.  Hopeful for p.o. improvement now that she has had a bowel movement  1-10 app improving, ate 50-75-75 yesterday  8. Incisional hernia repair reexploration 12/4 and 12/5 c/b hematoma:  -Gen Surgery continuing to follow as needed.    9. Acute bilateral cerebellar/cerebral infarct secondary to profound intracranial hypoperfusion due to hemorrhagic shock/hypotension:  -Continue Asprin. Follow up with [facility] outpatient.    10. Acute metabolic encephalopathy: seems much improved, continue to monitor-delirium precautions.    11. ABLA: secondary to hemorrhagic shock/critical illness. Hemoglobin 8.8 (12/31). Monitor CBC.    - 1/6: hemoglobin stable on admission labs, add daily iron supplement 325 mg to assist in RBC repletion.  Add on iron levels and TIBC to labs today--WNL  1-8: DC iron supplement due to stomach upset, nausea  1-9: Bleeding around PEG tube site today.  H&H stable 8.5.  IR evaluated, appreciate recommendations.  Patient has abdominal binder to keep PEG in place when out of bed.  1/10 PEG site stable today 12. AKI on CKD stage IIIb: continue to monitor renal function.  -Right renal infarct: incidental finding on CT imaging on 12/4-likely due to hypoperfusion in setting of hypotension. Continue supportive care.    - Creatinine stable 1.5, BUN remains elevated 42, encourage p.o. fluids.   1-8: Creatinine 1.6 today, BUN 40.  Discussed need to eat fluid intake with patient.  Depending on I's and O's today, may give IV fluids tomorrow.  1-9: Creatinine 1.9, 500 cc fluid bolus followed by 50 cc/h normal saline for 1 day.  BMP in AM.  Discussed increasing p.o. fluids with patient, husband.  1/10 BUN/Cr 43/1.78 today, sl improvement   -continue to push fluids   -resume IVF if needed. Continue flushes thru PEG   -  recheck labs Monday  14. HCAP (Enterobacter cloacae on tracheal aspirate  culture): Completed antibiotic course.    15. Hypertension: monitor BP per protocol on Amlodipine  10 mg, Coreg  12.5 mg, and Hydralazine  50 mg.    - Blood pressure stable, monitor    11/30/2024    4:43 AM 11/29/2024    7:40 PM 11/29/2024   12:48 PM  Vitals with BMI  Weight 99 lbs 7 oz    BMI 17.62    Systolic 149 140 887  Diastolic 99 62 49  Pulse 89 79 77    16. Hypothyrodism: continue Synthroid .    17. 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 Serene)             -on Aspirin     18.  Severe constipation status post multiple intra-abdominal surgeries.  - Last bowel movement 12/28  - KUB today with large stool burden--no signs of obstruction--gave sorbitol  30 mL  Large bowel movement 1-7 with sorbitol ; increase MiraLAX  to twice daily and monitor  Large T6 BM 1/10  19.  Nausea.  Encouraged use of as needed Zofran .  On Protonix .,  Will increase to twice daily AC.  - 1-9: Improved.  CMP stable with exception of AKI, see #12  LOS: 5 days A FACE TO FACE EVALUATION WAS PERFORMED  Erin [name] 11/30/2024, 7:55 AM     "

## 2024-11-30 NOTE — Plan of Care (Signed)
" °  Problem: RH KNOWLEDGE DEFICIT Goal: RH STG INCREASE KNOWLEDGE OF HYPERTENSION Description: Manage increase knowledge of hypertension with supervision assistance from husband using educational materials provided Outcome: Progressing Goal: RH STG INCREASE KNOWLEDGE OF DYSPHAGIA/FLUID INTAKE Description: Manage increase knowledge of dysphagia with supervision assistance from husband using educational materials provided Outcome: Progressing Goal: RH STG INCREASE KNOWLEDGE OF STROKE PROPHYLAXIS Description: Manage increase knowledge of stroke prophylaxis with supervision assistance from husband using educational materials provided Outcome: Progressing   "

## 2024-11-30 NOTE — Progress Notes (Signed)
 Rounded on patient. Up with therapy eating 3rd serving of jello, states she feels much better than this morning

## 2024-11-30 NOTE — Progress Notes (Addendum)
 Patient had a vomiting episode during OT session. Per patient it was black and brown/chunky. Vital signs given to MD -see flowsheet. PRN IV zofran  given per MD orders; tolerated well and morning medications. Peg site looks fine and patent.    E. Kit

## 2024-11-30 NOTE — Progress Notes (Signed)
 Occupational Therapy Session Note  Patient Details  Name: Erin [name] MRN: [id number] Date of Birth: 1955/12/20  Today's Date: 11/30/2024 OT Individual Time: 0700-0800 OT Individual Time Calculation (min): 60 min    Short Term Goals: Week 1:  OT Short Term Goal 1 (Week 1): Pt will tolerate >2 mins of standing ADL with supervision + LRAD. OT Short Term Goal 2 (Week 1): Pt will dress LB with CGA + PRN AE. OT Short Term Goal 3 (Week 1): Pt will perform ambulatory toilet transfers with CGA + LRAD.  Skilled Therapeutic Interventions/Progress Updates:  Patient agreeable to participate in OT session. Reports 6/10 pain level, medication administered.   Patient participated in skilled OT session focusing on self care. Patient received in bed vomiting, nursing notified for medication management. Patient requested to toilet and get cleaned up. Patient min A HHA for functional toilet transfer via ambulation. Patient CG TO MIN a for toileting. Patient then transported to wc at sink to get washed up. Patient able to wash UB/ LB SUP to min A. Patient UB dressing SU, LB dressing min for threading RLE and stand. Patient then given medication for nausea/ pain, vitals assessed by OT/ nursing. MD contacted due to color/ occurrence of vomiting. Patient returned to bed with new linen, left in room with nursing all needs in reach spouse present.    Therapy Documentation Precautions:  Precautions Precautions: Fall Recall of Precautions/Restrictions: Impaired Precaution/Restrictions Comments: Peg Tube; R-sided hemiparesis Restrictions Weight Bearing Restrictions Per Provider Order: No  Therapy/Group: Individual Therapy  Erin [name] 11/30/2024, 7:54 AM

## 2024-12-01 LAB — GLUCOSE, CAPILLARY
Glucose-Capillary: 94 mg/dL (ref 70–99)
Glucose-Capillary: 86 mg/dL (ref 70–99)
Glucose-Capillary: 106 mg/dL — ABNORMAL HIGH (ref 70–99)
Glucose-Capillary: 96 mg/dL (ref 70–99)

## 2024-12-01 NOTE — Plan of Care (Signed)
  Problem: RH BOWEL ELIMINATION Goal: RH STG MANAGE BOWEL WITH ASSISTANCE Description: STG Manage Bowel with supervision Assistance. Outcome: Progressing   Problem: RH BLADDER ELIMINATION Goal: RH STG MANAGE BLADDER WITH ASSISTANCE Description: STG Manage Bladder With supervision Assistance Outcome: Progressing   Problem: RH SKIN INTEGRITY Goal: RH STG SKIN FREE OF INFECTION/BREAKDOWN Description: Manage skin free of infection with supervision assistance Outcome: Progressing

## 2024-12-01 NOTE — Plan of Care (Signed)
" °  Problem: Consults Goal: RH STROKE PATIENT EDUCATION Description: See Patient Education module for education specifics  Outcome: Progressing   Problem: RH BOWEL ELIMINATION Goal: RH STG MANAGE BOWEL WITH ASSISTANCE Description: STG Manage Bowel with supervision Assistance. Outcome: Progressing   Problem: RH BLADDER ELIMINATION Goal: RH STG MANAGE BLADDER WITH ASSISTANCE Description: STG Manage Bladder With supervision Assistance Outcome: Progressing   Problem: RH SKIN INTEGRITY Goal: RH STG SKIN FREE OF INFECTION/BREAKDOWN Description: Manage skin free of infection with supervision assistance Outcome: Progressing   Problem: RH SAFETY Goal: RH STG ADHERE TO SAFETY PRECAUTIONS W/ASSISTANCE/DEVICE Description: STG Adhere to Safety Precautions With Assistance/Device. Outcome: Progressing   Problem: RH PAIN MANAGEMENT Goal: RH STG PAIN MANAGED AT OR BELOW PT'S PAIN GOAL Description: <4 w/ prns Outcome: Progressing   Problem: RH KNOWLEDGE DEFICIT Goal: RH STG INCREASE KNOWLEDGE OF HYPERTENSION Description: Manage increase knowledge of hypertension with supervision assistance from husband using educational materials provided Outcome: Progressing Goal: RH STG INCREASE KNOWLEDGE OF DYSPHAGIA/FLUID INTAKE Description: Manage increase knowledge of dysphagia with supervision assistance from husband using educational materials provided Outcome: Progressing Goal: RH STG INCREASE KNOWLEDGE OF STROKE PROPHYLAXIS Description: Manage increase knowledge of stroke prophylaxis with supervision assistance from husband using educational materials provided Outcome: Progressing   "

## 2024-12-01 NOTE — Progress Notes (Signed)
 "                                                        PROGRESS NOTE   Subjective/Complaints: Slept well last night. Had 2 episodes of n/v yesterday. Pt feels that they are due to taking meds on empty stomach. Zofran  helped. Had bm yesterday. No nausea this am. Denies abdominal pain. Lidocaine  patches helped a little with leg pain.  ROS: Patient denies fever, rash, sore throat, blurred vision, dizziness,  diarrhea, cough, shortness of breath or chest pain, joint or back/neck pain, headache, or mood change.   Objective:   No results found.  Recent Labs    11/29/24 1234  HGB 8.5*  HCT 26.5*   Recent Labs    11/29/24 0543 11/30/24 0551  NA 140 136  K 5.0 4.1  CL 102 101  CO2 27 24  GLUCOSE 87 98  BUN 50* 43*  CREATININE 1.99* 1.78*  CALCIUM  9.8 9.4    Intake/Output Summary (Last 24 hours) at 12/01/2024 0728 Last data filed at 12/01/2024 0528 Gross per 24 hour  Intake 1040 ml  Output 300 ml  Net 740 ml        Physical Exam: Vital Signs Blood pressure 138/70, pulse 76, temperature 98.6 F (37 C), temperature source Oral, resp. rate 19, height 5' 3 (1.6 m), weight 46.4 kg, SpO2 100%.   Constitutional: No distress . Vital signs reviewed. HEENT: NCAT, EOMI, oral membranes moist Neck: supple Cardiovascular: RRR without murmur. No JVD    Respiratory/Chest: CTA Bilaterally without wheezes or rales. Normal effort    GI/Abdomen: BS +, non-tender, non-distended, PEG site c/d/I without blood or drainage Ext: no clubbing, cyanosis, or edema Psych: pleasant and cooperative  Skin:   - hernia repair site with steri strips, healing well, well-approximated - PEG site clean as above MSK:      No apparent deformity. + TTP in the bilateral knees and proximal thighs now, R>L, a little less sensitive 1/11       Neurologic exam:  Cognition: AAO to person, place, and time.   Follows commands appropriately. Language: Mild dysarthria. +speech apraxia, difficulty wordfinding, and  substitutions--improving daily Memory: Poor, +distractable--much improved Insight: Poor insight into current condition.  Mood: Pleasant affect, appropriate mood.  Sensation: To light touch intact in BL UEs and LEs   Reflexes: 2+ in BL UE; 1+ RLE, 3+ LLE 1-2 beats clonus LLE with ankle jerk. + Hoffman's LUE.  CN: 2-12 grossly intact.   Coordination: BL UE and LE ataxia, slightly worse on the left; c/b motor apraxia.  Spasticity: MAS 0 in all extremities.        Strength: 4/ 5 proximal left upper and lower extremity, otherwise 5-/5 throughout--stable 1/10       Assessment/Plan: 1. Functional deficits which require 3+ hours per day of interdisciplinary therapy in a comprehensive inpatient rehab setting. Physiatrist is providing close team supervision and 24 hour management of active medical problems listed below. Physiatrist and rehab team continue to assess barriers to discharge/monitor patient progress toward functional and medical goals  Care Tool:  Bathing    Body parts bathed by patient: Right arm, Chest, Abdomen, Front perineal area, Right upper leg, Left upper leg, Left arm, Right lower leg, Left lower leg, Face   Body parts bathed by helper: Buttocks,  Left arm, Right lower leg, Left lower leg     Bathing assist Assist Level: Minimal Assistance - Patient > 75%     Upper Body Dressing/Undressing Upper body dressing   What is the patient wearing?: Pull over shirt    Upper body assist Assist Level: Set up assist    Lower Body Dressing/Undressing Lower body dressing      What is the patient wearing?: Underwear/pull up, Pants     Lower body assist Assist for lower body dressing: Contact Guard/Touching assist     Toileting Toileting    Toileting assist Assist for toileting: Moderate Assistance - Patient 50 - 74%     Transfers Chair/bed transfer  Transfers assist     Chair/bed transfer assist level: Minimal Assistance - Patient > 75%      Locomotion Ambulation   Ambulation assist      Assist level: 2 helpers Assistive device: No Device Max distance: 130'   Walk 10 feet activity   Assist     Assist level: Minimal Assistance - Patient > 75% Assistive device: No Device   Walk 50 feet activity   Assist    Assist level: 2 helpers Assistive device: No Device    Walk 150 feet activity   Assist Walk 150 feet activity did not occur: Safety/medical concerns         Walk 10 feet on uneven surface  activity   Assist     Assist level: Minimal Assistance - Patient > 75%     Wheelchair     Assist Is the patient using a wheelchair?: Yes Type of Wheelchair: Manual    Wheelchair assist level: Dependent - Patient 0% Max wheelchair distance: 150'    Wheelchair 50 feet with 2 turns activity    Assist        Assist Level: Dependent - Patient 0%   Wheelchair 150 feet activity     Assist      Assist Level: Dependent - Patient 0%   Blood pressure 138/70, pulse 76, temperature 98.6 F (37 C), temperature source Oral, resp. rate 19, height 5' 3 (1.6 m), weight 46.4 kg, SpO2 100%.   Medical Problem List and Plan: 1. 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  Antithrombotics: -DVT/anticoagulation:  Mechanical: Sequential compression devices, below knee Bilateral lower extremities Pharmaceutical: sq Heparin  5000 q8             -antiplatelet therapy: Asprin 81 mg daily    1/11 continue to hold heparin  given recent bleeding around PEG site.  H&H were stable.  SCDs ordered, teds.     -she's walking 175'+ with therapy, so sq hep no longer needed 3. Pain Management: history of peripheral neuropathy Gabapentin  cut back to 100 mg q12 d/t encephalopathy.   PRN tylenol  and tramadol .   -1/6: Right knee pain for approximately 1 week, will get x-ray, Voltaren  gel 4 times daily, depending on findings may need injection--xray with Minimal tibial spine spur formation. Mild anterior patellar enthesophyte formation at the quadriceps tendon insertion. - 1/7: Robaxin  500 mg TID for muscle cramping in quads  1-9: Significant bilateral lower extremity sensitivity/pain.  on chart review, for neuropathy secondary to  peripheral artery disease was on gabapentin  600 mg 3 times daily, Requip , duloxetine , and tizanidine as outpatient.  - Increase gabapentin  to 200 mg twice daily; hesitant to increase further given AKI  - Resume Requip  1 mg nightly  - Resume duloxetine  20 mg daily  - Will increase Robaxin  to 750 mg 3 times daily--hesitant to switch to home tizanidine given risk of sedation but may consider this in the future   1/11  sl improvement in pain. Really can't push gabapentin  any more   -continue lidocaine  patches   -hopefully duloxetine  and requip  will help as well 4. Mood/Behavior/Sleep: LCSW to follow for evaluation and support when available.              -antipsychotic agents: N/A             -Continue Ritalin  5 mg a.m. to encourage alertness     1-8: Mirtazapine  7.5 mg nightly added yesterday for sleep and appetite stimulation; patient overly lethargic today, will DC mirtazapine  to see if this improves  - DC'ed mirtazapine  due to lethargy, nausea.  Symptoms significantly improved.  5. Neuropsych/cognition: This patient is capable of making decisions on her own behalf.   6. Skin/Wound Care: Routine pressure relief measures.   - Surgical sites look good   7. Fluids/Electrolytes/Nutrition/dysphagia: Monitor I&O and weight. Follow up labs CBC/CMP               Oropharyngeal dysphagia: Dysphagia: Secondary to CVA/anoxic injury.  -PEG placed on 12/30-- -Pt to receive a can of osmolite if eats less than 50% of meal -continue protein supp and free water  -consider appetite  stimulant  1/6: Severe constipation contributor to nausea and poor meal intake; nutrition consult today.  Treating constipation as below.  1-7: Converting to bolus feeds if less than 50% meal completion via PEG tube.  Hopeful for p.o. improvement now that she has had a bowel movement  1-11 didn't eat much yesterday given nausea but appetite has been picking up. Hopefully will do better today 8. Incisional hernia repair reexploration 12/4 and 12/5 c/b hematoma:  -Gen Surgery continuing to follow as needed.    9. Acute bilateral cerebellar/cerebral infarct secondary to profound intracranial hypoperfusion due to hemorrhagic shock/hypotension:  -Continue Asprin. Follow up with [facility] outpatient.    10. Acute metabolic encephalopathy: seems much improved, continue to monitor-delirium precautions.    11. ABLA: secondary to hemorrhagic shock/critical illness. Hemoglobin 8.8 (12/31). Monitor CBC.    - 1/6: hemoglobin stable on admission labs, add daily iron supplement 325 mg to assist in RBC repletion.  Add on iron levels and TIBC to labs today--WNL  1-8: DC iron supplement due to stomach upset, nausea  1-9: Bleeding around PEG tube site today.  H&H stable 8.5.  IR evaluated, appreciate recommendations.  Patient has abdominal binder to keep PEG in place when out of bed.  1/10-11 PEG site stable this w/e 12. AKI on CKD stage IIIb: continue to monitor renal function.  -Right renal infarct: incidental finding on CT imaging on 12/4-likely due to hypoperfusion in setting of hypotension. Continue supportive care.    - Creatinine stable 1.5, BUN remains elevated 42, encourage p.o. fluids.   1-8: Creatinine 1.6 today, BUN 40.  Discussed need to eat fluid intake with patient.  Depending on I's and O's today, may give IV fluids tomorrow.  1-9: Creatinine 1.9, 500 cc fluid bolus followed by 50 cc/h normal saline for 1 day.  BMP in AM.  Discussed increasing p.o. fluids with  patient, husband.  1/11 BUN/Cr 43/1.78  yesterday, sl improvement   -continue to push fluids   -resume IVF if needed. Continue flushes thru PEG   -recheck labs Monday  14. HCAP (Enterobacter cloacae on tracheal aspirate culture): Completed antibiotic course.    15. Hypertension: monitor BP per protocol on Amlodipine  10 mg, Coreg  12.5 mg, and Hydralazine  50 mg.    - Blood pressure stable, monitor    12/01/2024    5:00 AM 11/30/2024    7:29 PM 11/30/2024    6:16 PM  Vitals with BMI  Weight 102 lbs 5 oz    BMI 18.13    Systolic 138 136 850  Diastolic 70 59 68  Pulse 76 72 82    16. Hypothyrodism: continue Synthroid .    17. 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 Serene)             -on Aspirin     18.  Severe constipation status post multiple intra-abdominal surgeries.  - Last bowel movement 12/28  - KUB today with large stool burden--no signs of obstruction--gave sorbitol  30 mL  Large bowel movement 1-7 with sorbitol ; increase MiraLAX  to twice daily and monitor  Large T6 BM 1/10  19.  Nausea.  Encouraged use of as needed Zofran .  On Protonix .,  Will increase to twice daily AC.  - 1/11 recurrent yesterday   -d/w nurse, making sure we pretreat with zofran  before meds   -encourage eating something before taking meds as well   -she's moving bowels  LOS: 6 days A FACE TO FACE EVALUATION WAS PERFORMED  Erin [name] 12/01/2024, 7:28 AM     "

## 2024-12-02 ENCOUNTER — Ambulatory Visit: Admitting: Dermatology

## 2024-12-02 LAB — BASIC METABOLIC PANEL WITH GFR
Anion gap: 12 (ref 5–15)
BUN: 29 mg/dL — ABNORMAL HIGH (ref 8–23)
CO2: 25 mmol/L (ref 22–32)
Calcium: 9.7 mg/dL (ref 8.9–10.3)
Chloride: 101 mmol/L (ref 98–111)
Creatinine, Ser: 1.75 mg/dL — ABNORMAL HIGH (ref 0.44–1.00)
GFR, Estimated: 31 mL/min — ABNORMAL LOW
Glucose, Bld: 94 mg/dL (ref 70–99)
Potassium: 3.5 mmol/L (ref 3.5–5.1)
Sodium: 138 mmol/L (ref 135–145)

## 2024-12-02 LAB — CBC WITH DIFFERENTIAL/PLATELET
Abs Immature Granulocytes: 0.01 K/uL (ref 0.00–0.07)
Basophils Absolute: 0 K/uL (ref 0.0–0.1)
Basophils Relative: 1 %
Eosinophils Absolute: 0.3 K/uL (ref 0.0–0.5)
Eosinophils Relative: 5 %
HCT: 29.4 % — ABNORMAL LOW (ref 36.0–46.0)
Hemoglobin: 9.8 g/dL — ABNORMAL LOW (ref 12.0–15.0)
Immature Granulocytes: 0 %
Lymphocytes Relative: 26 %
Lymphs Abs: 1.5 K/uL (ref 0.7–4.0)
MCH: 32.3 pg (ref 26.0–34.0)
MCHC: 33.3 g/dL (ref 30.0–36.0)
MCV: 97 fL (ref 80.0–100.0)
Monocytes Absolute: 0.8 K/uL (ref 0.1–1.0)
Monocytes Relative: 13 %
Neutro Abs: 3.2 K/uL (ref 1.7–7.7)
Neutrophils Relative %: 55 %
Platelets: 341 K/uL (ref 150–400)
RBC: 3.03 MIL/uL — ABNORMAL LOW (ref 3.87–5.11)
RDW: 14.8 % (ref 11.5–15.5)
WBC: 5.8 K/uL (ref 4.0–10.5)
nRBC: 0 % (ref 0.0–0.2)

## 2024-12-02 LAB — GLUCOSE, CAPILLARY
Glucose-Capillary: 86 mg/dL (ref 70–99)
Glucose-Capillary: 45 mg/dL — ABNORMAL LOW (ref 70–99)
Glucose-Capillary: 100 mg/dL — ABNORMAL HIGH (ref 70–99)
Glucose-Capillary: 99 mg/dL (ref 70–99)
Glucose-Capillary: 126 mg/dL — ABNORMAL HIGH (ref 70–99)

## 2024-12-02 MED ORDER — SODIUM CHLORIDE 0.9 % BOLUS PEDS
1000.0000 mL | Freq: Once | INTRAVENOUS | Status: AC
  Administered 2024-12-02: 1000 mL via INTRAVENOUS

## 2024-12-02 MED ORDER — FREE WATER
200.0000 mL | Status: DC
  Administered 2024-12-02 – 2024-12-04 (×13): 200 mL

## 2024-12-02 MED ORDER — GABAPENTIN 100 MG PO CAPS
100.0000 mg | ORAL_CAPSULE | Freq: Three times a day (TID) | ORAL | Status: DC
  Administered 2024-12-02 – 2024-12-10 (×25): 100 mg via ORAL
  Filled 2024-12-02 (×26): qty 1

## 2024-12-02 NOTE — Progress Notes (Signed)
 Hypoglycemic Event  CBG: 45  Treatment: 8 oz juice/soda  Symptoms: None  Follow-up CBG: Time:1141 CBG Result:100  Possible Reasons for Event: Unknown  Comments/MD notified:Yes    Erin [name] Dineen

## 2024-12-02 NOTE — Progress Notes (Signed)
 "                                                        PROGRESS NOTE   Subjective/Complaints:  No events overnight. No acute complaints.  Ongoing pain to the bilateral lower extremities but able to participate in therapies. Vitals stable, systolic BP little elevated but diastolic soft    8/87/7973    2:99 AM 12/01/2024    9:40 PM 12/01/2024    1:57 PM  Vitals with BMI  Systolic 133 159 855  Diastolic 59 61 55  Pulse 67 79 69    Recent Labs    12/01/24 1616 12/01/24 2137 12/02/24 0556  GLUCAP 106* 96 86    Blood sugars well-controlled Labs this a.m. with BUN 21, creatinine slightly improved 1.75.  Other labs stable or improved. Last bowel movement 1-11, large  Eating intermittent, 100% of breakfast yesterday then 50% of lunch and 0% in dinner.  Did not need anything on Saturday.  ROS: Patient denies fever, rash, sore throat, blurred vision, dizziness,  diarrhea, cough, shortness of breath or chest pain, joint or back/neck pain, headache, or mood change.  + Intermittent nausea, bilateral right greater than left knee pain  Objective:   No results found.  Recent Labs    11/29/24 1234 12/02/24 0617  WBC  --  5.8  HGB 8.5* 9.8*  HCT 26.5* 29.4*  PLT  --  341   Recent Labs    11/30/24 0551 12/02/24 0617  NA 136 138  K 4.1 3.5  CL 101 101  CO2 24 25  GLUCOSE 98 94  BUN 43* 29*  CREATININE 1.78* 1.75*  CALCIUM  9.4 9.7    Intake/Output Summary (Last 24 hours) at 12/02/2024 0727 Last data filed at 12/02/2024 0557 Gross per 24 hour  Intake 1160 ml  Output --  Net 1160 ml        Physical Exam: Vital Signs Blood pressure (!) 133/59, pulse 67, temperature 98 F (36.7 C), resp. rate 17, height 5' 3 (1.6 m), weight 46.4 kg, SpO2 98%.   Constitutional: No distress . Vital signs reviewed. HEENT: NCAT, EOMI, oral membranes moist Neck: supple Cardiovascular: RRR without murmur. No JVD    Respiratory/Chest: CTA Bilaterally without wheezes or rales. Normal  effort    GI/Abdomen: BS +, non-tender, non-distended, PEG site c/d/I without blood or drainage Ext: no clubbing, cyanosis, or edema Psych: pleasant and cooperative  Skin:   - hernia repair site with steri strips, healing well, well-approximated - PEG site clean as above MSK:      No apparent deformity. + TTP in the bilateral knees and proximal thighs        Neurologic exam:  Cognition: AAO to person, place, and time.   Follows commands appropriately. Language: Mild dysarthria. +speech apraxia, difficulty wordfinding, and substitutions--improving daily Memory: Poor, +distractable--much improved Insight: Poor insight into current condition.  Mood: Pleasant affect, appropriate mood.  Sensation: To light touch intact in BL UEs and LEs   Reflexes: 2+ in BL UE; 1+ RLE, 3+ LLE 1-2 beats clonus LLE with ankle jerk. + Hoffman's LUE.  CN: 2-12 grossly intact.   Coordination: BL UE and LE ataxia, slightly worse on the left; c/b motor apraxia.  Spasticity: MAS 0 in all extremities.        Strength:  4/ 5 proximal left upper and lower extremity, otherwise 5-/5 throughout--stable 1/10    Physical exam unchanged from the above on reexamination 12/02/2024     Assessment/Plan: 1. Functional deficits which require 3+ hours per day of interdisciplinary therapy in a comprehensive inpatient rehab setting. Physiatrist is providing close team supervision and 24 hour management of active medical problems listed below. Physiatrist and rehab team continue to assess barriers to discharge/monitor patient progress toward functional and medical goals  Care Tool:  Bathing    Body parts bathed by patient: Right arm, Chest, Abdomen, Front perineal area, Right upper leg, Left upper leg, Left arm, Right lower leg, Left lower leg, Face   Body parts bathed by helper: Buttocks, Left arm, Right lower leg, Left lower leg     Bathing assist Assist Level: Minimal Assistance - Patient > 75%     Upper Body  Dressing/Undressing Upper body dressing   What is the patient wearing?: Pull over shirt    Upper body assist Assist Level: Set up assist    Lower Body Dressing/Undressing Lower body dressing      What is the patient wearing?: Underwear/pull up, Pants     Lower body assist Assist for lower body dressing: Contact Guard/Touching assist     Toileting Toileting    Toileting assist Assist for toileting: Moderate Assistance - Patient 50 - 74%     Transfers Chair/bed transfer  Transfers assist     Chair/bed transfer assist level: Minimal Assistance - Patient > 75%     Locomotion Ambulation   Ambulation assist      Assist level: 2 helpers Assistive device: No Device Max distance: 130'   Walk 10 feet activity   Assist     Assist level: Minimal Assistance - Patient > 75% Assistive device: No Device   Walk 50 feet activity   Assist    Assist level: 2 helpers Assistive device: No Device    Walk 150 feet activity   Assist Walk 150 feet activity did not occur: Safety/medical concerns         Walk 10 feet on uneven surface  activity   Assist     Assist level: Minimal Assistance - Patient > 75%     Wheelchair     Assist Is the patient using a wheelchair?: Yes Type of Wheelchair: Manual    Wheelchair assist level: Dependent - Patient 0% Max wheelchair distance: 150'    Wheelchair 50 feet with 2 turns activity    Assist        Assist Level: Dependent - Patient 0%   Wheelchair 150 feet activity     Assist      Assist Level: Dependent - Patient 0%   Blood pressure (!) 133/59, pulse 67, temperature 98 F (36.7 C), resp. rate 17, height 5' 3 (1.6 m), weight 46.4 kg, SpO2 98%.   Medical Problem List and Plan: 1. 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  Antithrombotics: -DVT/anticoagulation:  Mechanical: Sequential compression devices, below knee Bilateral lower extremities Pharmaceutical: sq Heparin  5000 q8             -antiplatelet therapy: Asprin 81 mg daily  1/10-continue to hold heparin  given recent bleeding around PEG site.  H&H were stable.  SCDs ordered, teds.    -she's walking 175'+ with therapy, so sq hep no longer needed  3. Pain Management: history of peripheral neuropathy Gabapentin  cut back to 100 mg q12 d/t encephalopathy.   PRN tylenol  and tramadol .  -1/6: Right knee pain for approximately 1 week, will get x-ray, Voltaren  gel 4 times daily, depending on findings may need injection--xray with Minimal tibial spine spur formation. Mild anterior patellar enthesophyte formation at the quadriceps tendon insertion. - 1/7: Robaxin  500 mg TID for muscle cramping in quads  1-9: Significant bilateral lower extremity sensitivity/pain.  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  - Increase gabapentin  to 200 mg twice daily; hesitant to increase further given AKI  - Resume Requip  1 mg nightly  - Resume duloxetine  20 mg daily  - Will increase Robaxin  to 750 mg 3 times daily--hesitant to switch to home tizanidine given risk of sedation but may consider this in the future   1/11  sl improvement in pain. Really can't push gabapentin  any more   -continue lidocaine  patches   -hopefully duloxetine  and requip  will help as well   - 1-12: Reduce gabapentin  to 100 mg 3 times daily due to persistent AKI     4. Mood/Behavior/Sleep: LCSW to follow for evaluation and support when available.              -antipsychotic agents: N/A             -Continue Ritalin  5 mg a.m. to encourage alertness     1-8: Mirtazapine  7.5 mg nightly added yesterday for sleep and appetite stimulation; patient  overly lethargic today, will DC mirtazapine  to see if this improves  - DC'ed mirtazapine  due to lethargy, nausea.  Symptoms significantly improved.  5. Neuropsych/cognition: This patient is capable of making decisions on her own behalf.   - 1-12: DC Ritalin , monitor 6. Skin/Wound Care: Routine pressure relief measures.   - Surgical sites look good   7. Fluids/Electrolytes/Nutrition/dysphagia: Monitor I&O and weight. Follow up labs CBC/CMP               Oropharyngeal dysphagia: Dysphagia: Secondary to CVA/anoxic injury.  -PEG placed on 12/30-- -Pt to receive a can of osmolite if eats less than 50% of meal -continue protein supp and free water  -consider appetite stimulant  1/6: Severe constipation contributor to nausea and poor meal intake; nutrition consult today.  Treating constipation as below.  1-7: Converting to bolus feeds if less than 50% meal completion via PEG tube.  Hopeful for p.o. improvement now that she has had a bowel movement  1-11 didn't eat much yesterday given nausea but appetite has been picking up. Hopefully will do better today--slightly better 1-12  8. Incisional hernia repair reexploration 12/4 and 12/5 c/b hematoma:  -Gen Surgery continuing to follow as needed.    9. Acute bilateral cerebellar/cerebral infarct secondary to profound intracranial hypoperfusion due to hemorrhagic shock/hypotension:  -Continue Asprin. Follow up with [facility] outpatient.    10. Acute metabolic encephalopathy: seems much improved, continue to monitor-delirium precautions.    11. ABLA: secondary to hemorrhagic shock/critical illness. Hemoglobin 8.8 (12/31). Monitor CBC.    - 1/6: hemoglobin stable on admission labs, add daily iron supplement 325 mg to assist in RBC repletion.  Add on iron levels and TIBC to labs today--WNL  1-8:  DC iron supplement due to stomach upset, nausea  1-9: Bleeding around PEG tube site today.  H&H stable 8.5.  IR evaluated, appreciate recommendations.  Patient has  abdominal binder to keep PEG in place when out of bed.  1/10-11 PEG site stable this w/e  12. AKI on CKD stage IIIb: continue to monitor renal function.  -Right renal infarct: incidental finding on CT imaging on 12/4-likely due to hypoperfusion in setting of hypotension. Continue supportive care.    - Creatinine stable 1.5, BUN remains elevated 42, encourage p.o. fluids.   1-8: Creatinine 1.6 today, BUN 40.  Discussed need to eat fluid intake with patient.  Depending on I's and O's today, may give IV fluids tomorrow.  1-9: Creatinine 1.9, 500 cc fluid bolus followed by 50 cc/h normal saline for 1 day.  BMP in AM.  Discussed increasing p.o. fluids with patient, husband.   1/11 BUN/Cr 43/1.78 yesterday, sl improvement   -continue to push fluids   -resume IVF if needed. Continue flushes thru PEG   -recheck labs Monday   1-12: No improvement, minimal p.o. fluid intakes.  Bolus another liter of fluids today at lower rate 125/h, increase free water  flushes to 200 mL every 4 hours.  Repeat BMP in a.m. reduce gabapentin  to  100 mg 3 times daily for AKI  14. HCAP (Enterobacter cloacae on tracheal aspirate culture): Completed antibiotic course.    15. Hypertension: monitor BP per protocol on Amlodipine  10 mg, Coreg  12.5 mg, and Hydralazine  50 mg.    - Blood pressure stable, monitor    12/02/2024    7:00 AM 12/01/2024    9:40 PM 12/01/2024    1:57 PM  Vitals with BMI  Systolic 133 159 855  Diastolic 59 61 55  Pulse 67 79 69    16. Hypothyrodism: continue Synthroid .    17. 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 Serene)             -on Aspirin     18.  Severe constipation status post multiple intra-abdominal surgeries.  - Last bowel movement 12/28  - KUB today with large stool burden--no signs of obstruction--gave sorbitol  30 mL  Large bowel movement 1-7 with sorbitol ; increase MiraLAX  to twice daily and monitor  Large  BM 1/11  19.  Nausea.  Encouraged  use of as needed Zofran .  On Protonix .,  Will increase to twice daily AC.  - 1/11 recurrent yesterday   -d/w nurse, making sure we pretreat with zofran  before meds   -encourage eating something before taking meds as well   -she's moving bowels  1-12: Ate a little better yesterday.  IV fluids as above.  LOS: 7 days A FACE TO FACE EVALUATION WAS PERFORMED  Joesph JAYSON Likes 12/02/2024, 7:27 AM     "

## 2024-12-02 NOTE — Progress Notes (Addendum)
 Speech Language Pathology Weekly Progress and Session Note  Patient Details  Name: Erin [name] MRN: [id number] Date of Birth: March 01, 1956  Beginning of progress report period: November 26, 2024 End of progress report period: December 02, 2024  Today's Date: 12/02/2024 SLP Individual Time: 0800-0830 SLP Individual Time Calculation (min): 30 min  Short Term Goals: Week 1: SLP Short Term Goal 1 (Week 1): Patient will name functional items with 90% accuracy given min multimodal A SLP Short Term Goal 1 - Progress (Week 1): Not met SLP Short Term Goal 2 (Week 1): Patient will answer complex yes/no questions with 80% accuracy given min multimodal A SLP Short Term Goal 2 - Progress (Week 1): Met SLP Short Term Goal 3 (Week 1): Patient will utilize swallowing compensatory strategies during consumption of least restrictive diet given min verbal A SLP Short Term Goal 3 - Progress (Week 1): Met SLP Short Term Goal 4 (Week 1): Patient will demonstrate problem solving skills during functional scenarios given min multimodal A SLP Short Term Goal 4 - Progress (Week 1): Not met SLP Short Term Goal 5 (Week 1): Patient will recall daily information given min multimodal A SLP Short Term Goal 5 - Progress (Week 1): Not met    New Short Term Goals: Week 2: SLP Short Term Goal 1 (Week 2): STGs = LTGs d/t ELOS  Weekly Progress Updates: Slight progress noted this week. She continues to present w/ variable success throughout the day and day to day for unknown reasons, however, met 2/5 goals this reporting period. She demonstrates improved auditory comprehension/processing and met her dysphagia goal. No concerns re true dysphagia, as she is able to tolerate a regular diet/thin liquids w/o difficulty. Additionally, she is able to swallow pills whole w/ liquid. She would benefit from continued intermittent supervision d/t cognitive deficits though. Hopeful for increased consistency this week, however, LTGs will  need to be downgraded if this does not occur. She continues to vary from supervision-maxA overall just dependent on the tx session and would benefit from continued ST to target moderate cognitive-linguistic deficits remaining, maximize pt independence, and reduce caregiver burden. Pt/family education ongoing.  Intensity: Minumum of 1-2 x/day, 30 to 90 minutes Frequency: 3 to 5 out of 7 days Duration/Length of Stay: TBD in next team conference Treatment/Interventions: Cognitive remediation/compensation;Cueing hierarchy;Dysphagia/aspiration precaution training;Functional tasks;Internal/external aids;Multimodal communication approach;Patient/family education;Speech/Language facilitation;Therapeutic Activities   Daily Session  Skilled Therapeutic Interventions:  Pt and husband greeted at bedside for tx targeting communication and cognition. Pt appeared in good spirits, smiling and greeting SLP upon her arrival. Her nurse was present as well, providing morning medications. She was able to swallow multiple pills at a time w/ thin liquids and no overt s/s of airway invasion were noted. Recommend d/c dysphagia goals. SLP provided education re need for continued challenge (AKA swallowing pills whole because she can) given success thus far. She then benefited from only minA for specific word finding and sentence formulation during conversational task re menu preferences. At the end of tx tasks, she was left in bed w/ the alarm set and call light within reach. Recommend cont ST per POC.   Pain Pain Assessment Pain Scale: 0-10 Pain Score: 5  Pain Location: Abdomen Pain Intervention(s): Medication (See eMAR)  Therapy/Group: Individual Therapy  Recardo DELENA Mole 12/02/2024, 8:53 AM

## 2024-12-02 NOTE — Progress Notes (Signed)
 Occupational Therapy Session Note  Patient Details  Name: Erin [name] MRN: [id number] Date of Birth: May 31, 1956  Today's Date: 12/02/2024 OT Individual Time: 9149-9052 OT Individual Time Calculation (min): 57 min    Short Term Goals: Week 1:  OT Short Term Goal 1 (Week 1): Pt will tolerate >2 mins of standing ADL with supervision + LRAD. OT Short Term Goal 2 (Week 1): Pt will dress LB with CGA + PRN AE. OT Short Term Goal 3 (Week 1): Pt will perform ambulatory toilet transfers with CGA + LRAD.  Skilled Therapeutic Interventions/Progress Updates:    Pt received supine with no c/o pain, agreeable to OT session. She came to EOB with (S). She donned pants EOB with min A for sit > stand 2/2 posterior bias with stand. She completed functional mobility to the sink with min A with the RW. Poor RW management, requiring edu re use. She completed oral care in standing to further challenge functional activity tolerance- CGA for balance support with unilateral UE on the sink. Rest break seated following. Pt was taken via w/c to the therapy gym for time management. Husband present and supportive throughout. She then completed 75 ft of functional mobility with no device, requiring min A for trunk control, as well as mod cueing for lifting BLE thoroughly and correcting anterior trunk bias as she gained momentum. Pt completed standing level rstepping activity with no UE support using a 5 in step 3x8 repetitions. Activity performed to challenge dynamic standing balance and functional activity tolerance to simulate household threshold management and reduce fall risk. Pt required min A with very poor righting reactions, almost absent to the R. Poor awareness/attention to trunk righting to midline. Pt required use of rest breaks throughout session for recovery, as well as to support safety and prevent overexertion. During breaks, OT monitored recovery time to assess endurance and response to exertion. She reported  incontinent BM and returned to her room, 70 ft with min HHA. She completed toileting tasks with min A overall, requiring extra time d/t frequent re-void and very loose stool. She returned to supine in bed following. Pt was left supine with all needs met, bed alarm set and call bell within reach.   Therapy Documentation Precautions:  Precautions Precautions: Fall Recall of Precautions/Restrictions: Impaired Precaution/Restrictions Comments: Peg Tube; R-sided hemiparesis Restrictions Weight Bearing Restrictions Per Provider Order: No  Therapy/Group: Individual Therapy  Nena VEAR Moats 12/02/2024, 8:13 AM

## 2024-12-02 NOTE — Plan of Care (Signed)
  Problem: RH Swallowing Goal: LTG Patient will consume least restrictive diet using compensatory strategies with assistance (SLP) Description: LTG:  Patient will consume least restrictive diet using compensatory strategies with assistance (SLP) Outcome: Completed/Met   

## 2024-12-02 NOTE — Plan of Care (Signed)
" °  Problem: Consults Goal: RH STROKE PATIENT EDUCATION Description: See Patient Education module for education specifics  Outcome: Progressing   Problem: RH BOWEL ELIMINATION Goal: RH STG MANAGE BOWEL WITH ASSISTANCE Description: STG Manage Bowel with supervision Assistance. Outcome: Progressing   Problem: RH BLADDER ELIMINATION Goal: RH STG MANAGE BLADDER WITH ASSISTANCE Description: STG Manage Bladder With supervision Assistance Outcome: Progressing   Problem: RH SKIN INTEGRITY Goal: RH STG SKIN FREE OF INFECTION/BREAKDOWN Description: Manage skin free of infection with supervision assistance Outcome: Progressing   Problem: RH SAFETY Goal: RH STG ADHERE TO SAFETY PRECAUTIONS W/ASSISTANCE/DEVICE Description: STG Adhere to Safety Precautions With Assistance/Device. Outcome: Progressing   Problem: RH PAIN MANAGEMENT Goal: RH STG PAIN MANAGED AT OR BELOW PT'S PAIN GOAL Description: <4 w/ prns Outcome: Progressing   Problem: RH KNOWLEDGE DEFICIT Goal: RH STG INCREASE KNOWLEDGE OF HYPERTENSION Description: Manage increase knowledge of hypertension with supervision assistance from husband using educational materials provided Outcome: Progressing Goal: RH STG INCREASE KNOWLEDGE OF DYSPHAGIA/FLUID INTAKE Description: Manage increase knowledge of dysphagia with supervision assistance from husband using educational materials provided Outcome: Progressing Goal: RH STG INCREASE KNOWLEDGE OF STROKE PROPHYLAXIS Description: Manage increase knowledge of stroke prophylaxis with supervision assistance from husband using educational materials provided Outcome: Progressing   "

## 2024-12-02 NOTE — Progress Notes (Signed)
 Physical Therapy Session Note  Patient Details  Name: Erin [name] MRN: [id number] Date of Birth: 1956/02/19  Today's Date: 12/02/2024 PT Individual Time: 8966-8884 PT Individual Time Calculation (min): 42 min   Today's Date: 12/02/2024 PT Individual Time: 8586-8475 PT Individual Time Calculation (min): 71 min   Short Term Goals: Week 1:  PT Short Term Goal 1 (Week 1): Pt will complete sit to stand with CGA PT Short Term Goal 2 (Week 1): Pt will complete bed to chair with CGA. PT Short Term Goal 3 (Week 1): Pt will ambulate x150' with CGA and LRAD.  Skilled Therapeutic Interventions/Progress Updates:     1st Session: Pt received supine in bed and agrees to therapy. Reports she is feeling much better and knee pain is much improved. Pt reports pain in Rt posterolateral neck. PT provides manual therapy and rest breaks to manage pain. Supine to sit with cues for sequencing and positioning. Pt performs stand step to [facility] with CGA and cues for safety and hand placement. WC transport to gym. Pt performs sit to stand with RW and ambulates x403' with RW and CGA, with cues for posture and increasing stride length and step height for safety. Pt takes seated rest break. Pt then ambulates x125' without AD, requiring minA and cues for trunk rotation and arm swing to improve balance. WC transport back to room. Pt performs stand step back to bed with CGA. Left with call bell in reach and alarm intact.   2nd Session: Pt received supine in bed asleep. Awakens to verbal stimuli and agrees to therapy. No complaint of pain. Supine to sit with bed features. Pt's husband assists to ambulate to toilet, with PT providing cues for safe guarding technique. Following, PT provides WC transport to gym. Pt perform sit to stand with CGA, then ambulates x175', initially with minA but as fatigue increases and pt approaches WC, pt requires modA due to poor safety awareness and attempting to sit prior to being in front of WC. PT  provides education on importance of mobilizing safely and not assuming that PT or husband will assist to prevent fall. Following extended seated rest break, pt ambulates x100' with minA and improved sequencing of transition back to WC. Following rest break, pt performs high level gait training, performing side stepping and backward gait. Pt has multiple LOBs, consistently with backward ambulation and frequently with Rt and Lt sidestepping. PT provides up to totalA to prevent falls. Cues provided for posture and body mechanics. Pt completes multiple bouts with seated rest break.  Pt completes 2x5:00 on Nustep for endurance training. Pt completes with only lower extremities to limit discomfort on abdomen. PT provides cues for foot placement and completing full available ROM. Pt completes at workload of 3 with average steps per minute ~30. Stand step from Nustep>WC>bed with minA. Left seated at EOB with all needs within reach.   Therapy Documentation Precautions:  Precautions Precautions: Fall Recall of Precautions/Restrictions: Impaired Precaution/Restrictions Comments: Peg Tube; R-sided hemiparesis Restrictions Weight Bearing Restrictions Per Provider Order: No  Therapy/Group: Individual Therapy  Elsie JAYSON Dawn, PT, DPT 12/02/2024, 3:27 PM

## 2024-12-03 LAB — GLUCOSE, CAPILLARY
Glucose-Capillary: 91 mg/dL (ref 70–99)
Glucose-Capillary: 102 mg/dL — ABNORMAL HIGH (ref 70–99)
Glucose-Capillary: 86 mg/dL (ref 70–99)
Glucose-Capillary: 98 mg/dL (ref 70–99)

## 2024-12-03 LAB — BASIC METABOLIC PANEL WITH GFR
Anion gap: 11 (ref 5–15)
BUN: 23 mg/dL (ref 8–23)
CO2: 23 mmol/L (ref 22–32)
Calcium: 8.9 mg/dL (ref 8.9–10.3)
Chloride: 104 mmol/L (ref 98–111)
Creatinine, Ser: 1.65 mg/dL — ABNORMAL HIGH (ref 0.44–1.00)
GFR, Estimated: 33 mL/min — ABNORMAL LOW
Glucose, Bld: 87 mg/dL (ref 70–99)
Potassium: 3.4 mmol/L — ABNORMAL LOW (ref 3.5–5.1)
Sodium: 137 mmol/L (ref 135–145)

## 2024-12-03 LAB — MAGNESIUM: Magnesium: 1.9 mg/dL (ref 1.7–2.4)

## 2024-12-03 MED ORDER — TRAMADOL HCL 50 MG PO TABS
50.0000 mg | ORAL_TABLET | Freq: Four times a day (QID) | ORAL | Status: DC
  Administered 2024-12-03 – 2024-12-05 (×7): 50 mg via ORAL
  Filled 2024-12-03 (×8): qty 1

## 2024-12-03 MED ORDER — POTASSIUM CHLORIDE 20 MEQ PO PACK
20.0000 meq | PACK | Freq: Two times a day (BID) | ORAL | Status: AC
  Administered 2024-12-03 – 2024-12-04 (×4): 20 meq via ORAL
  Filled 2024-12-03 (×4): qty 1

## 2024-12-03 MED ORDER — HYDRALAZINE HCL 50 MG PO TABS
75.0000 mg | ORAL_TABLET | Freq: Three times a day (TID) | ORAL | Status: DC
  Administered 2024-12-03 – 2024-12-10 (×19): 75 mg via ORAL
  Filled 2024-12-03 (×21): qty 1

## 2024-12-03 NOTE — Plan of Care (Signed)
" °  Problem: Consults Goal: RH STROKE PATIENT EDUCATION Description: See Patient Education module for education specifics  Outcome: Progressing   Problem: RH BOWEL ELIMINATION Goal: RH STG MANAGE BOWEL WITH ASSISTANCE Description: STG Manage Bowel with supervision Assistance. Outcome: Progressing   Problem: RH BLADDER ELIMINATION Goal: RH STG MANAGE BLADDER WITH ASSISTANCE Description: STG Manage Bladder With supervision Assistance Outcome: Progressing   Problem: RH SKIN INTEGRITY Goal: RH STG SKIN FREE OF INFECTION/BREAKDOWN Description: Manage skin free of infection with supervision assistance Outcome: Progressing   Problem: RH SAFETY Goal: RH STG ADHERE TO SAFETY PRECAUTIONS W/ASSISTANCE/DEVICE Description: STG Adhere to Safety Precautions With Assistance/Device. Outcome: Progressing   Problem: RH PAIN MANAGEMENT Goal: RH STG PAIN MANAGED AT OR BELOW PT'S PAIN GOAL Description: <4 w/ prns Outcome: Progressing   Problem: RH KNOWLEDGE DEFICIT Goal: RH STG INCREASE KNOWLEDGE OF HYPERTENSION Description: Manage increase knowledge of hypertension with supervision assistance from husband using educational materials provided Outcome: Progressing Goal: RH STG INCREASE KNOWLEDGE OF DYSPHAGIA/FLUID INTAKE Description: Manage increase knowledge of dysphagia with supervision assistance from husband using educational materials provided Outcome: Progressing Goal: RH STG INCREASE KNOWLEDGE OF STROKE PROPHYLAXIS Description: Manage increase knowledge of stroke prophylaxis with supervision assistance from husband using educational materials provided Outcome: Progressing   "

## 2024-12-03 NOTE — Progress Notes (Signed)
 Educated the patient and husband on PEG tube care. Instructed the patient and husband on how to perform flushes through the tube. The husband demonstrated flushing the tube with 200 mL of water . Both verbalize understanding of the procedure and feel comfortable performing at home once discharged.

## 2024-12-03 NOTE — Progress Notes (Signed)
 Nutrition Follow Up  DOCUMENTATION CODES:   Severe malnutrition in context of chronic illness, Underweight  INTERVENTION:  Encouraged adequate intake of meals to meet calorie and protein needs Magic cup TID with meals, each supplement provides 290 kcal and 9 grams of protein Pt does not like any other ONS  NUTRITION DIAGNOSIS:   Severe Malnutrition related to chronic illness as evidenced by severe fat depletion, severe muscle depletion. Remains applicable  GOAL:   Patient will meet greater than or equal to 90% of their needs Progressing  MONITOR:   PO intake, Supplement acceptance, TF tolerance  REASON FOR ASSESSMENT:   Consult Assessment of nutrition requirement/status, Enteral/tube feeding initiation and management  ASSESSMENT:   Pt with hx of GERD, IBS, CAD, and HTN. Recent admission for elective hernia repair 12/3 complicated by hypotension and returned to OR 12/4 for ex lap then subsequently abdomen closed 12/5 with no signs of active bleeding. Post stat CT 12/8 concerning for CVA. Course further complicated by dysphagia and malnutrition which led to PEG placement 12/30. Admitted to CIR for comprehensive rehab.   Spoke with pt and pt's husband at bedside. Pt very fatigued following morning therapy sessions and was resting in bed. Pt's appetite appears to have improved since discontinuing bolus feeds. Average intake increased to 55% over last 6 meals. Pt reports no issues with n/v or constipation, had bowel movement yesterday. Pt had episode of hypoglycemia yesterday (CBG 45 mg/dL), but pt had not eaten breakfast that morning due to poor appetite. Discussed importance of consistent intake to prevent hypoglycemia.  Husband continues to support pt and encourage her at meal times. Potassium low this morning, repletion ordered to supplement.   Weight trending up with no reported edema, encouraged continued intake to meet nutrition needs and promote recovery.  Average Meal  Completion: 1/6: 40% average intake x 1 recorded meals 1/11-1/13: 55% average intake x 6 recorded meals  Nutritionally Relevant Medications: Levothyroxine  Protonix  Miralax  BID Potassium chloride  20 mEq Senna Zinc  sulfate daily  Labs reviewed: CBG x 24 h: 91-126 mg/dL Creatinine 8.34 J8r 4.9  Admit weight: 43.1 kg Current weight: 46.6 kg   Diet Order:   Diet Order             Diet regular Room service appropriate? Yes with Assist; Fluid consistency: Thin  Diet effective now                   EDUCATION NEEDS:   Education needs have been addressed  Skin:  Skin Assessment: Skin Integrity Issues: Skin Integrity Issues:: Incisions Incisions: abdomen  Last BM:  1/12 type 6  Height:   Ht Readings from Last 1 Encounters:  11/25/24 5' 3 (1.6 m)    Weight:   Wt Readings from Last 1 Encounters:  12/03/24 46.6 kg    Ideal Body Weight:  52.27 kg  BMI:  Body mass index is 18.2 kg/m.  Estimated Nutritional Needs:   Kcal:  1500-1700  Protein:  65-85g  Fluid:  1.5-1.7L    Josette Glance, MS, RDN, LDN Clinical Dietitian I Please reach out via secure chat

## 2024-12-03 NOTE — Progress Notes (Signed)
 "                                                        PROGRESS NOTE   Subjective/Complaints: No events overnight. No acute complaints.  Continues with bilateral lower extremity pain, primarily in the proximal thighs, worse on the right. Vitals stable      12/03/2024    6:15 AM 12/03/2024    5:00 AM 12/02/2024    9:40 PM  Vitals with BMI  Weight  102 lbs 12 oz   BMI  18.2   Systolic 156  148  Diastolic 58  67  Pulse 74  70    Recent Labs    12/02/24 1627 12/02/24 2142 12/03/24 0614  GLUCAP 99 126* 91     P.o. intakes remain poor, minimal p.o. fluids, labs significant for ongoing elevations in creatinine.  Otherwise stable.    ROS: Patient denies fever, rash, sore throat, blurred vision, dizziness,  diarrhea, cough, shortness of breath or chest pain, joint or back/neck pain, headache, or mood change.  + Bilateral lower extremity pain  Objective:   No results found.  Recent Labs    12/02/24 0617  WBC 5.8  HGB 9.8*  HCT 29.4*  PLT 341   Recent Labs    12/02/24 0617 12/03/24 0509  NA 138 137  K 3.5 3.4*  CL 101 104  CO2 25 23  GLUCOSE 94 87  BUN 29* 23  CREATININE 1.75* 1.65*  CALCIUM  9.7 8.9    Intake/Output Summary (Last 24 hours) at 12/03/2024 0916 Last data filed at 12/03/2024 0757 Gross per 24 hour  Intake 1912 ml  Output --  Net 1912 ml        Physical Exam: Vital Signs Blood pressure (!) 156/58, pulse 74, temperature 97.9 F (36.6 C), resp. rate 17, height 5' 3 (1.6 m), weight 46.6 kg, SpO2 95%.   Constitutional: No distress . Vital signs reviewed. HEENT: NCAT, EOMI, oral membranes moist Neck: supple Cardiovascular: RRR without murmur. No JVD    Respiratory/Chest: CTA Bilaterally without wheezes or rales. Normal effort    GI/Abdomen: BS +, non-tender, non-distended, PEG site c/d/I without blood or drainage Ext: no clubbing, cyanosis, or edema Psych: pleasant and cooperative  Skin:   - hernia repair site with steri strips, healing  well, well-approximated - PEG site clean as above MSK:      No apparent deformity. + TTP in the bilateral knees and proximal thighs now, R>L, a little less sensitive 1/11       Neurologic exam:  Cognition: AAO to person, place, and time.   Follows commands appropriately. Language: Mild dysarthria. +speech apraxia, difficulty wordfinding, and substitutions--improving daily Memory: Poor, +distractable--much improved Insight: Poor insight into current condition.  Mood: Pleasant affect, appropriate mood.  Sensation: To light touch intact in BL UEs and LEs   Reflexes: 2+ in BL UE; 1+ RLE, 3+ LLE 1-2 beats clonus LLE with ankle jerk. + Hoffman's LUE.  CN: 2-12 grossly intact.   Coordination: BL UE and LE ataxia, slightly worse on the left; c/b motor apraxia.  Spasticity: MAS 0 in all extremities.        Strength: 4/ 5 proximal left upper and lower extremity, otherwise 5-/5 throughout--stable 1/10     Physical exam unchanged from the above on reexamination 12/03/2024  Assessment/Plan: 1. Functional deficits which require 3+ hours per day of interdisciplinary therapy in a comprehensive inpatient rehab setting. Physiatrist is providing close team supervision and 24 hour management of active medical problems listed below. Physiatrist and rehab team continue to assess barriers to discharge/monitor patient progress toward functional and medical goals  Care Tool:  Bathing    Body parts bathed by patient: Right arm, Chest, Abdomen, Front perineal area, Right upper leg, Left upper leg, Left arm, Right lower leg, Left lower leg, Face   Body parts bathed by helper: Buttocks, Left arm, Right lower leg, Left lower leg     Bathing assist Assist Level: Minimal Assistance - Patient > 75%     Upper Body Dressing/Undressing Upper body dressing   What is the patient wearing?: Pull over shirt    Upper body assist Assist Level: Set up assist    Lower Body Dressing/Undressing Lower body  dressing      What is the patient wearing?: Underwear/pull up, Pants     Lower body assist Assist for lower body dressing: Contact Guard/Touching assist     Toileting Toileting    Toileting assist Assist for toileting: Moderate Assistance - Patient 50 - 74%     Transfers Chair/bed transfer  Transfers assist     Chair/bed transfer assist level: Minimal Assistance - Patient > 75%     Locomotion Ambulation   Ambulation assist      Assist level: 2 helpers Assistive device: No Device Max distance: 130'   Walk 10 feet activity   Assist     Assist level: Minimal Assistance - Patient > 75% Assistive device: No Device   Walk 50 feet activity   Assist    Assist level: 2 helpers Assistive device: No Device    Walk 150 feet activity   Assist Walk 150 feet activity did not occur: Safety/medical concerns         Walk 10 feet on uneven surface  activity   Assist     Assist level: Minimal Assistance - Patient > 75%     Wheelchair     Assist Is the patient using a wheelchair?: Yes Type of Wheelchair: Manual    Wheelchair assist level: Dependent - Patient 0% Max wheelchair distance: 150'    Wheelchair 50 feet with 2 turns activity    Assist        Assist Level: Dependent - Patient 0%   Wheelchair 150 feet activity     Assist      Assist Level: Dependent - Patient 0%   Blood pressure (!) 156/58, pulse 74, temperature 97.9 F (36.6 C), resp. rate 17, height 5' 3 (1.6 m), weight 46.6 kg, SpO2 95%.   Medical Problem List and Plan: 1. 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  Antithrombotics: -DVT/anticoagulation:  Mechanical: Sequential compression devices, below knee Bilateral lower extremities Pharmaceutical: sq  Heparin  5000 q8             -antiplatelet therapy: Asprin 81 mg daily    1/11 continue to hold heparin  given recent bleeding around PEG site.  H&H were stable.  SCDs ordered, teds.     -she's walking 175'+ with therapy,  so sq hep no longer needed 3. Pain Management: history of peripheral neuropathy Gabapentin  cut back to 100 mg q12 d/t encephalopathy.   PRN tylenol  and tramadol .  -1/6: Right knee pain for approximately 1 week, will get x-ray, Voltaren  gel 4 times daily, depending on findings may need injection--xray with Minimal tibial spine spur formation. Mild anterior patellar enthesophyte formation at the quadriceps tendon insertion. - 1/7: Robaxin  500 mg TID for muscle cramping in quads  1-9: Significant bilateral lower extremity sensitivity/pain.  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  - Increase gabapentin  to 200 mg twice daily; hesitant to increase further given AKI  - Resume Requip  1 mg nightly  - Resume duloxetine  20 mg daily  - Will increase Robaxin  to 750 mg 3 times daily--hesitant to switch to home tizanidine given risk of sedation but may consider this in the future   1/11  sl improvement in pain. Really can't push gabapentin  any more   -continue lidocaine  patches   -hopefully duloxetine  and requip  will help as well   1/13: Reduce gabapentin  to 100 mg 3 times daily due to AKI  4. Mood/Behavior/Sleep: LCSW to follow for evaluation and support when available.              -antipsychotic agents: N/A             -Continue Ritalin  5 mg a.m. to encourage alertness     1-8: Mirtazapine  7.5 mg nightly added yesterday for sleep and appetite stimulation; patient overly lethargic today, will DC mirtazapine  to see if this improves  - DC'ed mirtazapine  due to lethargy, nausea.  Symptoms significantly improved.  5. Neuropsych/cognition: This patient is capable of  making decisions on her own behalf.   6. Skin/Wound Care: Routine pressure relief measures.   - Surgical sites look good   7. Fluids/Electrolytes/Nutrition/dysphagia: Monitor I&O and weight. Follow up labs CBC/CMP               Oropharyngeal dysphagia: Dysphagia: Secondary to CVA/anoxic injury.  -PEG placed on 12/30-- -Pt to receive a can of osmolite if eats less than 50% of meal -continue protein supp and free water  -consider appetite stimulant  1/6: Severe constipation contributor to nausea and poor meal intake; nutrition consult today.  Treating constipation as below.  1-7: Converting to bolus feeds if less than 50% meal completion via PEG tube.  Hopeful for p.o. improvement now that she has had a bowel movement  1-11 didn't eat much yesterday given nausea but appetite has been picking up. Hopefully will do better today  1-12: Restart IV fluids as below 8. Incisional hernia repair reexploration 12/4 and 12/5 c/b hematoma:  -Gen Surgery continuing to follow as needed.    9. Acute bilateral cerebellar/cerebral infarct secondary to profound intracranial hypoperfusion due to hemorrhagic shock/hypotension:  -Continue Asprin. Follow up with [facility] outpatient.    10. Acute metabolic encephalopathy: seems much improved, continue to monitor-delirium precautions.    11. ABLA: secondary to hemorrhagic shock/critical illness. Hemoglobin 8.8 (12/31). Monitor CBC.    - 1/6: hemoglobin stable on admission labs, add daily iron supplement 325 mg to assist in RBC repletion.  Add on iron levels and TIBC to labs today--WNL  1-8: DC iron supplement due to stomach upset, nausea  1-9: Bleeding around PEG tube site today.  H&H stable 8.5.  IR evaluated, appreciate recommendations.  Patient has abdominal binder to keep PEG  in place when out of bed.  1/10-11 PEG site stable this w/e 12. AKI on CKD stage IIIb: continue to monitor renal function.  -Right renal infarct: incidental finding on CT imaging on  12/4-likely due to hypoperfusion in setting of hypotension. Continue supportive care.    - Creatinine stable 1.5, BUN remains elevated 42, encourage p.o. fluids.   1-8: Creatinine 1.6 today, BUN 40.  Discussed need to eat fluid intake with patient.  Depending on I's and O's today, may give IV fluids tomorrow.  1-9: Creatinine 1.9, 500 cc fluid bolus followed by 50 cc/h normal saline for 1 day.  BMP in AM.  Discussed increasing p.o. fluids with patient, husband.  1/11 BUN/Cr 43/1.78 yesterday, sl improvement   -continue to push fluids   -resume IVF if needed. Continue flushes thru PEG   -recheck labs Monday   1-12: BUN/creatinine remain elevated, stable.  Give 1 L IV fluid today.  Repeat in AM. 14. HCAP (Enterobacter cloacae on tracheal aspirate culture): Completed antibiotic course.    15. Hypertension: monitor BP per protocol on Amlodipine  10 mg, Coreg  12.5 mg, and Hydralazine  50 mg.    - Blood pressure stable, monitor    12/03/2024    6:15 AM 12/03/2024    5:00 AM 12/02/2024    9:40 PM  Vitals with BMI  Weight  102 lbs 12 oz   BMI  18.2   Systolic 156  148  Diastolic 58  67  Pulse 74  70    16. Hypothyrodism: continue Synthroid .    17. 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 Serene)             -on Aspirin     18.  Severe constipation status post multiple intra-abdominal surgeries.  - Last bowel movement 12/28  - KUB today with large stool burden--no signs of obstruction--gave sorbitol  30 mL  Large bowel movement 1-7 with sorbitol ; increase MiraLAX  to twice daily and monitor  Large T6 BM 1/10  19.  Nausea.  Encouraged use of as needed Zofran .  On Protonix .,  Will increase to twice daily AC.  - 1/11 recurrent yesterday   -d/w nurse, making sure we pretreat with zofran  before meds   -encourage eating something before taking meds as well   -she's moving bowels  1-12: Improved LOS: 8 days A FACE TO FACE EVALUATION WAS PERFORMED  Joesph JAYSON Likes 12/03/2024, 9:16 AM     "

## 2024-12-03 NOTE — Progress Notes (Addendum)
 Occupational Therapy Weekly Progress Note  Patient Details  Name: Erin [name] MRN: [id number] Date of Birth: 1956/05/12  Beginning of progress report period: November 26, 2024 End of progress report period: December 03, 2024  Today's Date: 12/03/2024 OT Individual Time: 8583-8495 OT Individual Time Calculation (min): 48 min    Patient has met 2 of 3 short term goals. Erin [name] is improving in her ADLs and transfers overall. [name] is very supportive and always present. She can complete UB ADLs at a (S) level and LB with CGA. She has reduced endurance and generalized deconditioning, as well as decreased righting reactions on the R. Her R UE/LE is improving significantly in strength and coordination.   Patient continues to demonstrate the following deficits: muscle weakness, decreased cardiorespiratoy endurance, decreased coordination and decreased motor planning, decreased attention, decreased awareness, decreased problem solving, decreased safety awareness, decreased memory, and delayed processing, and decreased standing balance, decreased postural control, hemiplegia, and decreased balance strategies and therefore will continue to benefit from skilled OT intervention to enhance overall performance with BADL and iADL.  Patient progressing toward long term goals..  Continue plan of care.  OT Short Term Goals Week 1:  OT Short Term Goal 1 (Week 1): Pt will tolerate >2 mins of standing ADL with supervision + LRAD. OT Short Term Goal 1 - Progress (Week 1): Progressing toward goal OT Short Term Goal 2 (Week 1): Pt will dress LB with CGA + PRN AE. OT Short Term Goal 2 - Progress (Week 1): Met OT Short Term Goal 3 (Week 1): Pt will perform ambulatory toilet transfers with CGA + LRAD. OT Short Term Goal 3 - Progress (Week 1): Met Week 2:  OT Short Term Goal 1 (Week 2): STG= LTG d/t ELOS  Skilled Therapeutic Interventions/Progress Updates:    Pt received supine with no c/o pain,  agreeable to OT session. She came to EOB with (S). Sit > stand with CGA from EOB. Using the RW she completed functional mobility into the bathroom with CGA. She completed full toileting tasks with min A. She completed oral care in standing with CGA with improved RW management. Pt required use of rest breaks throughout session for recovery, as well as to support safety and prevent overexertion. During breaks, OT monitored recovery time to assess endurance and response to exertion. She then completed 163 ft of functional mobility with the RW, with CGA, occasional min A for R postural sway. Following an extended rest break, she completed BUE endurance and strengthening based circuit. She held a 4 lb dowel and reached overhead and then forward 2x5 repetitions. Min facilitation required for shoulder ROM to 100 degrees. Carryover to UE support on the RW and ADLs. She completed 100 ft of functional mobility back to her room following with CGA for further endurance and balance training, using the RW. She was left supine with all needs met. Husband present.   Therapy Documentation Precautions:  Precautions Precautions: Fall Recall of Precautions/Restrictions: Impaired Precaution/Restrictions Comments: Peg Tube; R-sided hemiparesis Restrictions Weight Bearing Restrictions Per Provider Order: No  Therapy/Group: Individual Therapy  Erin [name] 12/03/2024, 10:11 AM

## 2024-12-03 NOTE — Progress Notes (Signed)
 Patient ID: Erin [name], female   DOB: 1955-12-17, 69 y.o.   MRN: [id number]   SW met with pt and pt husband in room to inform on d/c date 1/20, and SW will confirm d/c recs closer towards end of week.   Graeme Jude, MSW, LCSW Office: [phone] Cell: [phone] Fax: [phone]

## 2024-12-03 NOTE — Progress Notes (Signed)
 "                                                        PROGRESS NOTE   Subjective/Complaints: No events overnight.  Continues to complain of bilateral leg pain, now extending to her feet.  Per husband and PT, areas of pain changed from day today, yesterday was her neck and shoulders.  Patient denies this being significantly different from her pain at home, just more diffuse and severe.  Has been on scheduled tramadol  in the past, made her confused husband thinks at 1 point but has tolerated it well inpatient. Systolic blood pressure up, but diastolic remains soft.  Other vitals are stable. P.o. intakes improved, 60 and 90% of last 2 meals. BUN and creatinine improved with IV fluids, BUN 23, creatinine 1.65.   ROS: Patient denies fever, rash, sore throat, blurred vision, dizziness,  diarrhea, cough, shortness of breath or chest pain, joint or back/neck pain, headache, or mood change.  + Bilateral lower extremity pain + Fatigue  Objective:   No results found.  Recent Labs    12/02/24 0617  WBC 5.8  HGB 9.8*  HCT 29.4*  PLT 341   Recent Labs    12/02/24 0617 12/03/24 0509  NA 138 137  K 3.5 3.4*  CL 101 104  CO2 25 23  GLUCOSE 94 87  BUN 29* 23  CREATININE 1.75* 1.65*  CALCIUM  9.7 8.9    Intake/Output Summary (Last 24 hours) at 12/03/2024 0918 Last data filed at 12/03/2024 0757 Gross per 24 hour  Intake 1912 ml  Output --  Net 1912 ml        Physical Exam: Vital Signs Blood pressure (!) 156/58, pulse 74, temperature 97.9 F (36.6 C), resp. rate 17, height 5' 3 (1.6 m), weight 46.6 kg, SpO2 95%.   Constitutional: No distress . Vital signs reviewed.  Laying in bed. HEENT: NCAT, EOMI, oral membranes moist Neck: supple Cardiovascular: RRR without murmur. No JVD    Respiratory/Chest: CTA Bilaterally without wheezes or rales. Normal effort    GI/Abdomen: BS +, non-tender, non-distended, PEG site c/d/I Ext: no clubbing, cyanosis, or edema Psych: pleasant and  cooperative  Skin:   - hernia repair site with steri strips, healing well, well-approximated - PEG site clean, dry, intact.  MSK:      No apparent deformity. + TTP in the bilateral knees and proximal thighs now, equal bilaterally        Neurologic exam:  Cognition: AAO to person, place, and time.   Follows commands appropriately. Language: Mild dysarthria.  Complicated by lethargy. +speech apraxia, difficulty wordfinding, and substitutions--improving daily Memory: Poor, somewhat distractible Insight: Poor insight into current condition.  Mood: Pleasant affect, appropriate mood.  Sensation: To light touch intact in BL UEs and LEs   Reflexes: 2+ in BL UE; 1+ RLE, 3+ LLE 1-2 beats clonus LLE with ankle jerk. + Hoffman's LUE.  CN: 2-12 grossly intact.   Coordination: BL UE and LE ataxia,  c/b motor apraxia.  Spasticity: MAS 0 in all extremities.   Strength: 4/ 5 proximal left upper and lower extremity, otherwise 5-/5 throughout--complicated by pain with range of motion.    Assessment/Plan: 1. Functional deficits which require 3+ hours per day of interdisciplinary therapy in a comprehensive inpatient rehab setting. Physiatrist is providing close  team supervision and 24 hour management of active medical problems listed below. Physiatrist and rehab team continue to assess barriers to discharge/monitor patient progress toward functional and medical goals  Care Tool:  Bathing    Body parts bathed by patient: Right arm, Chest, Abdomen, Front perineal area, Right upper leg, Left upper leg, Left arm, Right lower leg, Left lower leg, Face   Body parts bathed by helper: Buttocks, Left arm, Right lower leg, Left lower leg     Bathing assist Assist Level: Minimal Assistance - Patient > 75%     Upper Body Dressing/Undressing Upper body dressing   What is the patient wearing?: Pull over shirt    Upper body assist Assist Level: Set up assist    Lower Body Dressing/Undressing Lower body  dressing      What is the patient wearing?: Underwear/pull up, Pants     Lower body assist Assist for lower body dressing: Contact Guard/Touching assist     Toileting Toileting    Toileting assist Assist for toileting: Moderate Assistance - Patient 50 - 74%     Transfers Chair/bed transfer  Transfers assist     Chair/bed transfer assist level: Minimal Assistance - Patient > 75%     Locomotion Ambulation   Ambulation assist      Assist level: 2 helpers Assistive device: No Device Max distance: 130'   Walk 10 feet activity   Assist     Assist level: Minimal Assistance - Patient > 75% Assistive device: No Device   Walk 50 feet activity   Assist    Assist level: 2 helpers Assistive device: No Device    Walk 150 feet activity   Assist Walk 150 feet activity did not occur: Safety/medical concerns         Walk 10 feet on uneven surface  activity   Assist     Assist level: Minimal Assistance - Patient > 75%     Wheelchair     Assist Is the patient using a wheelchair?: Yes Type of Wheelchair: Manual    Wheelchair assist level: Dependent - Patient 0% Max wheelchair distance: 150'    Wheelchair 50 feet with 2 turns activity    Assist        Assist Level: Dependent - Patient 0%   Wheelchair 150 feet activity     Assist      Assist Level: Dependent - Patient 0%   Blood pressure (!) 156/58, pulse 74, temperature 97.9 F (36.6 C), resp. rate 17, height 5' 3 (1.6 m), weight 46.6 kg, SpO2 95%.   Medical Problem List and Plan: 1. 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 Min A to Mod A transfers, worse without walker. Poor safety awareness - expects someone  will help her if she launches at a chair or the floor. CGA to Min A with a walker 175 ft. On regular/thins diet, Mod language with expressive > receptive deficits. Functional memory.   2.  Antithrombotics: -DVT/anticoagulation:  Mechanical: Sequential compression devices, below knee Bilateral lower extremities Pharmaceutical: sq Heparin  5000 q8             -  antiplatelet therapy: Asprin 81 mg daily    1/11 continue to hold heparin  given recent bleeding around PEG site.  H&H were stable.  SCDs ordered, teds.    -she's walking 175'+ with therapy, so sq hep no longer needed  3. Pain Management: history of peripheral neuropathy Gabapentin  cut back to 100 mg q12 d/t encephalopathy.   PRN tylenol  and tramadol .  -1/6: Right knee pain for approximately 1 week, will get x-ray, Voltaren  gel 4 times daily, depending on findings may need injection--xray with Minimal tibial spine spur formation. Mild anterior patellar enthesophyte formation at the quadriceps tendon insertion. - 1/7: Robaxin  500 mg TID for muscle cramping in quads  1-9: Significant bilateral lower extremity sensitivity/pain.  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  - Increase gabapentin  to 200 mg twice daily; hesitant to increase further given AKI  - Resume Requip  1 mg nightly  - Resume duloxetine  20 mg daily  - Will increase Robaxin  to 750 mg 3 times daily--hesitant to switch to home tizanidine given risk of sedation but may consider this in the future   1/11  sl improvement in pain. Really can't push gabapentin  any more   -continue lidocaine  patches   -hopefully duloxetine  and requip  will help as well   1/12: Reduce gabapentin  to 100 mg 3 times daily due to AKI  1-13: Schedule tramadol  50 mg every 6 hours for pain reduction.  Reviewed CT abdomen pelvis December 24, some degenerative disks most severe at L5-S1 and  L2-3, with some encroachment on the right L5-S1 neuroforamina.  Otherwise, spaces well-maintained, no obvious spinal stenosis.  4. Mood/Behavior/Sleep: LCSW to follow for evaluation and support when available.              -antipsychotic agents: N/A             -Continue Ritalin  5 mg a.m. to encourage alertness     1-8: Mirtazapine  7.5 mg nightly added yesterday for sleep and appetite stimulation; patient overly lethargic today, will DC mirtazapine  to see if this improves  - DC'ed mirtazapine  due to lethargy, nausea.  Symptoms significantly improved.  1-13: Significant difficulties with motivation and fatigue ongoing. DC of Ritalin .  Will likely take a while to respond to resumption of home duloxetine  as above.  5. Neuropsych/cognition: This patient is capable of making decisions on her own behalf.   6. Skin/Wound Care: Routine pressure relief measures.   - Surgical sites look good   7. Fluids/Electrolytes/Nutrition/dysphagia: Monitor I&O and weight. Follow up labs CBC/CMP               Oropharyngeal dysphagia: Dysphagia: Secondary to CVA/anoxic injury.  -PEG placed on 12/30-- -Pt to receive a can of osmolite if eats less than 50% of meal -continue protein supp and free water  -consider appetite stimulant  1/6: Severe constipation contributor to nausea and poor meal intake; nutrition consult today.  Treating constipation as below.  1-7: Converting to bolus feeds if less than 50% meal completion via PEG tube.  Hopeful for p.o. improvement now that she has had a bowel movement  1-11 didn't eat much yesterday given nausea but appetite has been picking up. Hopefully will do better today  1-12: Restart IV fluids as below 1-13: P.o. intakes improving.  Supplement potassium chloride  20 mill equivalents twice daily for 2 days.  8. Incisional hernia repair reexploration 12/4 and 12/5 c/b hematoma:  -Gen Surgery  continuing to follow as needed.    9. Acute bilateral cerebellar/cerebral infarct  secondary to profound intracranial hypoperfusion due to hemorrhagic shock/hypotension:  -Continue Asprin. Follow up with [facility] outpatient.    10. Acute metabolic encephalopathy: seems much improved, continue to monitor-delirium precautions.    11. ABLA: secondary to hemorrhagic shock/critical illness. Hemoglobin 8.8 (12/31). Monitor CBC.    - 1/6: hemoglobin stable on admission labs, add daily iron supplement 325 mg to assist in RBC repletion.  Add on iron levels and TIBC to labs today--WNL  1-8: DC iron supplement due to stomach upset, nausea  1-9: Bleeding around PEG tube site today.  H&H stable 8.5.  IR evaluated, appreciate recommendations.  Patient has abdominal binder to keep PEG in place when out of bed.  1/10-11 PEG site stable this w/e  12. AKI on CKD stage IIIb: continue to monitor renal function.  -Right renal infarct: incidental finding on CT imaging on 12/4-likely due to hypoperfusion in setting of hypotension. Continue supportive care.    - Creatinine stable 1.5, BUN remains elevated 42, encourage p.o. fluids.   1-8: Creatinine 1.6 today, BUN 40.  Discussed need to eat fluid intake with patient.  Depending on I's and O's today, may give IV fluids tomorrow.  1-9: Creatinine 1.9, 500 cc fluid bolus followed by 50 cc/h normal saline for 1 day.  BMP in AM.  Discussed increasing p.o. fluids with patient, husband.  1/11 BUN/Cr 43/1.78 yesterday, sl improvement   -continue to push fluids   -resume IVF if needed. Continue flushes thru PEG   -recheck labs Monday   1-12: BUN/creatinine remain elevated, stable.  Give 1 L IV fluid today, increase PEG tube flushes to every 4 hours 200 cc.  Repeat in AM.  1-13: BUN normalized, creatinine stable 1.6.  DC IV fluids, monitor today.  14. HCAP (Enterobacter cloacae on tracheal aspirate culture): Completed antibiotic course.    15. Hypertension: monitor BP per protocol on Amlodipine  10 mg, Coreg  12.5 mg, and Hydralazine  50 mg.    - Blood pressure  stable, monitor  - 1-13: Systolic BP up, diastolic remains stable.  Increase hydralazine  to 75 mg 3 times daily    12/03/2024    6:15 AM 12/03/2024    5:00 AM 12/02/2024    9:40 PM  Vitals with BMI  Weight  102 lbs 12 oz   BMI  18.2   Systolic 156  148  Diastolic 58  67  Pulse 74  70    16. Hypothyrodism: continue Synthroid .    17. 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 Serene)             -on Aspirin     18.  Severe constipation status post multiple intra-abdominal surgeries.  - Last bowel movement 12/28  - KUB today with large stool burden--no signs of obstruction--gave sorbitol  30 mL  Large bowel movement 1-7 with sorbitol ; increase MiraLAX  to twice daily and monitor  Last bowel movement 1-12, large  19.  Nausea.  Encouraged use of as needed Zofran .  On Protonix .,  Will increase to twice daily AC.  - 1/11 recurrent yesterday   -d/w nurse, making sure we pretreat with zofran  before meds   -encourage eating something before taking meds as well   -she's moving bowels  1-12: Improved LOS: 8 days A FACE TO FACE EVALUATION WAS PERFORMED  Joesph JAYSON Likes 12/03/2024, 9:18 AM     "

## 2024-12-03 NOTE — Progress Notes (Signed)
 Physical Therapy Session Note  Patient Details  Name: Erin [name] MRN: [id number] Date of Birth: 26-Oct-1956  Today's Date: 12/03/2024 PT Individual Time: 8883-8853 PT Individual Time Calculation (min): 30 min  and Today's Date: 12/03/2024 PT Missed Time: 15 Minutes Missed Time Reason: Patient fatigue  Short Term Goals: Week 1:  PT Short Term Goal 1 (Week 1): Pt will complete sit to stand with CGA PT Short Term Goal 2 (Week 1): Pt will complete bed to chair with CGA. PT Short Term Goal 3 (Week 1): Pt will ambulate x150' with CGA and LRAD.  Skilled Therapeutic Interventions/Progress Updates:     Pt received supine in bed asleep. Slow to awaken and reluctantly agrees to therapy, but states she is having a rough day. Pain reported in various areas, including bottom of feet. PT provides rest breaks and mobility to manage pain. Pt performs supine to sit with cues for sequencing and positioning. Pt's husband assists to don shoes prior to mobility. Pt then ambulates to toilet with minA and no AD, with cues for stability and positioning. Pt transfer from toilet to [employer] with minA and cues for positioning. WC transport to gym. Pt stands and ambulates x100', initially with minA but progressing to modA/maxA with increased distance and as pt approaches WC. Pt provides little effort to correct for gait deviation and balance impairments, despite max cueing to provide full effort and maintain safety. PT educates pt following bout on importance of safety and safety awareness, as well as attempting to prevent falls rather than relying on PT or husband to provide guarding. Pt states that her feet hurt and it was the best [she] could do. Pt requests to return to bed. PT and pt's husband strongly encourage pt to continue participating and pt reluctantly agrees. Pt stands and attempts ambulation again, but continues to have multiple and frequent LOBs. Pt again requests to return to bed. WC transport back to room.  Stand step to bed with minA. Pt left in sidelying with DO present. Pt misses 15 minutes of PT due to fatigue and refusal to participate.   Therapy Documentation Precautions:  Precautions Precautions: Fall Recall of Precautions/Restrictions: Impaired Precaution/Restrictions Comments: Peg Tube; R-sided hemiparesis Restrictions Weight Bearing Restrictions Per Provider Order: No   Therapy/Group: Individual Therapy  Elsie JAYSON Dawn, PT, DPT 12/03/2024, 4:43 PM

## 2024-12-03 NOTE — Progress Notes (Signed)
 Occupational Therapy Session Note  Patient Details  Name: Erin [name] MRN: [id number] Date of Birth: [birth year]  Today's Date: 12/03/2024 OT Individual Time: 9199-9142 OT Individual Time Calculation (min): 57 min    Short Term Goals: Week 1:  OT Short Term Goal 1 (Week 1): Pt will tolerate >2 mins of standing ADL with supervision + LRAD. OT Short Term Goal 2 (Week 1): Pt will dress LB with CGA + PRN AE. OT Short Term Goal 3 (Week 1): Pt will perform ambulatory toilet transfers with CGA + LRAD.  Skilled Therapeutic Interventions/Progress Updates: Patientreceived awake and dressed, ready to participate with OT treatment session. Husband present. Ambulated to therapy gym with RW CGA. Performed sit to stand activities working on endurance for safe functional transfers. Used 1 lb medicine ball to add work and english as a second language teacher. Patient with c/o pain at PEG site with transitional movements. Worked on seated therEx with 1 lb weight 2 x 10 in varied planes. Patient needed frequent rest breaks but seemed motivated throughout treatment. Ambulated half way back to room with CGA before getting too fatigued to continue. Patient assisted with w/c back to room and returned to bed with CGA/SBA using bed rail. Continue with skilled OT POC for improved ADL tolerance.      Therapy Documentation Precautions:  Precautions Precautions: Fall Recall of Precautions/Restrictions: Impaired Precaution/Restrictions Comments: Peg Tube; R-sided hemiparesis Restrictions Weight Bearing Restrictions Per Provider Order: No General:   Vital Signs: Therapy Vitals Temp: 97.9 F (36.6 C) Pulse Rate: 74 Resp: 17 BP: (!) 156/58 Patient Position (if appropriate): Lying Oxygen Therapy SpO2: 95 % O2 Device: Room Air Pain: abdominal 7-8/10 came back down after sitting supported.   ADL: ADL Eating: Set up Where Assessed-Eating: Bed level Grooming: Setup Where Assessed-Grooming: Edge of bed Upper Body  Bathing: Minimal assistance Where Assessed-Upper Body Bathing: Edge of bed Lower Body Bathing: Moderate assistance, Minimal assistance Where Assessed-Lower Body Bathing: Edge of bed Upper Body Dressing: Minimal assistance Where Assessed-Upper Body Dressing: Edge of bed Lower Body Dressing: Minimal assistance Where Assessed-Lower Body Dressing: Edge of bed Toileting: Minimal assistance Where Assessed-Toileting: Toilet, Psychiatrist Transfer: Minimal Dentist Method: Surveyor, Minerals: Gaffer: Not assessed Film/video Editor: Not assessed    Therapy/Group: Individual Therapy  Erin [name] 12/03/2024, 12:24 PM

## 2024-12-03 NOTE — Patient Care Conference (Signed)
 Inpatient RehabilitationTeam Conference and Plan of Care Update Date: 12/03/2024   Time: 1005 am    Patient Name: Erin [name]      Medical Record Number: [id number]  Date of Birth: 25-Aug-1956 Sex: Female         Room/Bed: 4M01C/4M01C-01 Payor Info: Payor: [employer] MEDICARE / Plan: [facility] MEDICARE / Product Type: *No Product type* /    Admit Date/Time:  11/25/2024  2:43 PM  Primary Diagnosis:  CVA (cerebral vascular accident) [facility])  Hospital Problems: Principal Problem:   CVA (cerebral vascular accident) [facility])    Expected Discharge Date: Expected Discharge Date: 12/10/24  Team Members Present: Physician leading conference: Dr. Joesph Likes Social Worker Present: Graeme Jude, LCSW Nurse Present: Eulalio Falls, RN PT Present: Kirt Dawn, PT OT Present: Nena Moats, OT SLP Present: Rosina Downy, SLP     Current Status/Progress Goal Weekly Team Focus  Bowel/Bladder   Remains continent of bladder and bowel and occasional bladder incontinence   Remain continence of bowel and bladder   Assess bowel and bladder every shift and assist in toileting    Swallow/Nutrition/ Hydration   regular/thin   n/a - met her goals 1/12       ADL's   (S) UB ADLs, CGA LB ADLs, CGA-min A toileting tasks. Reduced endurance, poor righting reactions on the R especially. Min A, occasional mod A with ADL transfers, especially without AD   Supervision overall   ADLs, balance training, discharge planning, endurance    Mobility   supervision bed mobility, CGA to minA transfers, MinA ambulation x175' with RW, safety awareness deficits   Supervision  dc prep, family ed, endurance    Communication   moderate language deficits: expressive more severe than receptive - highly variable from day to day for unkown reasons - varies from supervision to maxA   supervision - may need to downgrade to min if variable success remains   pt/family education, functional language tasks     Safety/Cognition/ Behavioral Observations  moderate cog deficits: attention, initiation, problem solving, functional memory - primary focus of tx sessions has been language though cog deficits are present - variable as well   supervision - may need to downgrade to min if variable success remains   pt/family education, visual memory aids, cognitive retraining,very functional tasks    Pain   reports rt knee pain   pt will report pain level less than 3 by the end of the week   give pain madications as per the treatment, asses pain q shift    Skin   Incision of abdomen ,peg tube  Patient to discharge with peg tube/ caregiver education pt will be free from effection during addmissin at rehab  MASD buttocks/ gerharts  cream, q shift assessment of the skin      Discharge Planning:  Pt will d/c to home with her husband who is primary caregiver. Husband reports there is additional support if needed. SW will confirm there are no barriers to discharge.    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 Patient with pain generalized, poor po intake, nausea, elevated BP: MD adjusting medications. Patient progress limited by poor safety awareness, poor endurance, moderate cognitive deficits, moderate language deficits, poor initiation.  Patient on target to meet rehab goals: Currently patient needs supervision assistance with upper body care and CGA with lower body care. Patient needs min assistance-moderate assistance with transfers  without a walker. Patient was able to ambulate up  to 175' CGA- min assistance using a rolling walker. Patient met swallowing goals but downgraded goals for cognition.   *See Care Plan and progress notes for long and short-term goals.   Revisions to Treatment Plan:  Downgraded goals for cognition   Teaching Needs: Safety, medications, transfers, toileting. Encourage to increase po intake, peg tube care  education, etc   Current Barriers to Discharge: Decreased caregiver support, Home enviroment access/layout, and Behavior  Possible Resolutions to Barriers: Family Education     Medical Summary Current Status: Medically complicated by complex abdominal hernia repair with abdominal pain, poor p.o. intakes/dysphagia, bilateral lower extremity pain, acute blood loss anemia, watershed CVAs with cognitive deficits, constipation, bladder incontinence, and AKI on CKD stage IIIb  Barriers to Discharge: Behavior/Mood;Complicated Wound;Inadequate Nutritional Intake;Incontinence;Medical stability;Neurogenic Bowel & Bladder;Self-care education;Symptomatic Anemia;Uncontrolled Pain   Possible Resolutions to [employer]: encourage p.o. fluids for AKI on CKD and IV fluids repletion as needed, encourage p.o. intakes for nutrition and wound healing, titrate bowel regimen, titrate pain regimen for minimally sedating regimen   Continued Need for Acute Rehabilitation Level of Care: The patient requires daily medical management by a physician with specialized training in physical medicine and rehabilitation for the following reasons: Direction of a multidisciplinary physical rehabilitation program to maximize functional independence : Yes Medical management of patient stability for increased activity during participation in an intensive rehabilitation regime.: Yes Analysis of laboratory values and/or radiology reports with any subsequent need for medication adjustment and/or medical intervention. : Yes   I attest that I was present, lead the team conference, and concur with the assessment and plan of the team.   Hardy Harcum Gayo 12/03/2024, 1005 am

## 2024-12-03 NOTE — Progress Notes (Signed)
 Speech Language Pathology Daily Session Note  Patient Details  Name: Erin [name] MRN: [id number] Date of Birth: 1956-10-07  Today's Date: 12/03/2024 SLP Individual Time: 0901-0957 SLP Individual Time Calculation (min): 56 min  Short Term Goals: Week 2: SLP Short Term Goal 1 (Week 2): STGs = LTGs d/t ELOS  Skilled Therapeutic Interventions: Skilled therapy session focused on cognitive and communication goals. SLP facilitated session by targeting orientation. Patient independently recalled name, location, situation and time! SLP then targeted expressive language through automatic speech task. Patient recalled numbers 1-10, days of the week and months of the year independently! SLP continued to challenge expressive language through synonym and antonym task. Patient recalled antonyms with 95% accuracy and minA. Patient recalled synonyms with 93% accuracy given modA. At the end of the session, patient independently wrote name and required minA to write days of the week. Patient left in bed wiht alarm set and call bell in reach. Continue POC  Pain Pain improved after meds provided by RN  Therapy/Group: Individual Therapy  Roylene Heaton M.A., CCC-SLP 12/03/2024, 7:45 AM

## 2024-12-04 LAB — GLUCOSE, CAPILLARY
Glucose-Capillary: 91 mg/dL (ref 70–99)
Glucose-Capillary: 114 mg/dL — ABNORMAL HIGH (ref 70–99)
Glucose-Capillary: 117 mg/dL — ABNORMAL HIGH (ref 70–99)
Glucose-Capillary: 104 mg/dL — ABNORMAL HIGH (ref 70–99)

## 2024-12-04 MED ORDER — MEGESTROL ACETATE 400 MG/10ML PO SUSP
400.0000 mg | Freq: Every day | ORAL | Status: DC
  Administered 2024-12-04 – 2024-12-10 (×7): 400 mg via ORAL
  Filled 2024-12-04 (×15): qty 10

## 2024-12-04 MED ORDER — CARVEDILOL 25 MG PO TABS
25.0000 mg | ORAL_TABLET | Freq: Two times a day (BID) | ORAL | Status: DC
  Administered 2024-12-04 – 2024-12-10 (×12): 25 mg via ORAL
  Filled 2024-12-04 (×12): qty 1

## 2024-12-04 MED ORDER — FREE WATER
200.0000 mL | Freq: Four times a day (QID) | Status: DC
  Administered 2024-12-04 – 2024-12-05 (×3): 200 mL

## 2024-12-04 MED ORDER — METHYLPHENIDATE HCL 5 MG PO TABS
5.0000 mg | ORAL_TABLET | Freq: Two times a day (BID) | ORAL | Status: DC
  Administered 2024-12-04 – 2024-12-10 (×12): 5 mg via ORAL
  Filled 2024-12-04 (×12): qty 1

## 2024-12-04 NOTE — Progress Notes (Signed)
 "                                                        PROGRESS NOTE   Subjective/Complaints: No events overnight.  Hypertensive last night to this morning, other vitals are stable.  Slept well overnight, no complaints of pain.  Has been concerned about poor p.o. intakes, approximately 60 to 90% intermittently but only ate 1 meal yesterday.  ROS: Patient denies fever, rash, sore throat, blurred vision, dizziness,  diarrhea, cough, shortness of breath or chest pain, joint or back/neck pain, headache, or mood change.  + Bilateral lower extremity pain + Fatigue  Objective:   No results found.  Recent Labs    12/02/24 0617  WBC 5.8  HGB 9.8*  HCT 29.4*  PLT 341   Recent Labs    12/02/24 0617 12/03/24 0509  NA 138 137  K 3.5 3.4*  CL 101 104  CO2 25 23  GLUCOSE 94 87  BUN 29* 23  CREATININE 1.75* 1.65*  CALCIUM  9.7 8.9    Intake/Output Summary (Last 24 hours) at 12/04/2024 0738 Last data filed at 12/04/2024 0428 Gross per 24 hour  Intake 1200 ml  Output --  Net 1200 ml        Physical Exam: Vital Signs Blood pressure (!) 176/77, pulse 91, temperature 98.9 F (37.2 C), temperature source Oral, resp. rate 16, height 5' 3 (1.6 m), weight 46.6 kg, SpO2 97%.   Constitutional: No distress . Vital signs reviewed.  Laying in bed. HEENT: NCAT, EOMI, oral membranes moist Neck: supple Cardiovascular: RRR without murmur. No JVD    Respiratory/Chest: CTA Bilaterally without wheezes or rales. Normal effort    GI/Abdomen: BS +, non-tender, non-distended, PEG site c/d/I Ext: no clubbing, cyanosis, or edema Psych: pleasant and cooperative  Skin:   - hernia repair site with steri strips, healing well, well-approximated - PEG site clean, dry, intact.  MSK:      No apparent deformity. + TTP in the bilateral knees and proximal thighs now, equal bilaterally        Neurologic exam:  Cognition: AAO to person, place, and time.   Follows commands appropriately. Language: Mild  dysarthria.  Complicated by lethargy. +speech apraxia, difficulty wordfinding, and substitutions--improving daily Memory: Poor, somewhat distractible Insight: Poor insight into current condition.  Mood: Pleasant affect, appropriate mood.  Sensation: To light touch intact in BL UEs and LEs   Reflexes: 2+ in BL UE; 1+ RLE, 3+ LLE 1-2 beats clonus LLE with ankle jerk. + Hoffman's LUE.  CN: 2-12 grossly intact.   Coordination: BL UE and LE ataxia,  c/b motor apraxia.  Spasticity: MAS 0 in all extremities.   Strength: 4/ 5 proximal left upper and lower extremity, otherwise 5-/5 throughout--complicated by pain with range of motion.  Physical exam unchanged from the above on reexamination 12/04/2024    Assessment/Plan: 1. Functional deficits which require 3+ hours per day of interdisciplinary therapy in a comprehensive inpatient rehab setting. Physiatrist is providing close team supervision and 24 hour management of active medical problems listed below. Physiatrist and rehab team continue to assess barriers to discharge/monitor patient progress toward functional and medical goals  Care Tool:  Bathing    Body parts bathed by patient: Right arm, Chest, Abdomen, Front perineal area, Right upper leg, Left upper leg,  Left arm, Right lower leg, Left lower leg, Face   Body parts bathed by helper: Buttocks, Left arm, Right lower leg, Left lower leg     Bathing assist Assist Level: Minimal Assistance - Patient > 75%     Upper Body Dressing/Undressing Upper body dressing   What is the patient wearing?: Pull over shirt    Upper body assist Assist Level: Set up assist    Lower Body Dressing/Undressing Lower body dressing      What is the patient wearing?: Underwear/pull up, Pants     Lower body assist Assist for lower body dressing: Contact Guard/Touching assist     Toileting Toileting    Toileting assist Assist for toileting: Moderate Assistance - Patient 50 - 74%      Transfers Chair/bed transfer  Transfers assist     Chair/bed transfer assist level: Minimal Assistance - Patient > 75%     Locomotion Ambulation   Ambulation assist      Assist level: 2 helpers Assistive device: No Device Max distance: 130'   Walk 10 feet activity   Assist     Assist level: Minimal Assistance - Patient > 75% Assistive device: No Device   Walk 50 feet activity   Assist    Assist level: 2 helpers Assistive device: No Device    Walk 150 feet activity   Assist Walk 150 feet activity did not occur: Safety/medical concerns         Walk 10 feet on uneven surface  activity   Assist     Assist level: Minimal Assistance - Patient > 75%     Wheelchair     Assist Is the patient using a wheelchair?: Yes Type of Wheelchair: Manual    Wheelchair assist level: Dependent - Patient 0% Max wheelchair distance: 150'    Wheelchair 50 feet with 2 turns activity    Assist        Assist Level: Dependent - Patient 0%   Wheelchair 150 feet activity     Assist      Assist Level: Dependent - Patient 0%   Blood pressure (!) 176/77, pulse 91, temperature 98.9 F (37.2 C), temperature source Oral, resp. rate 16, height 5' 3 (1.6 m), weight 46.6 kg, SpO2 97%.   Medical Problem List and Plan: 1. 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 Min A to Mod A transfers, worse without walker. Poor safety awareness - expects someone will help her if she launches at a chair or the floor. CGA to Min A with a walker 175 ft. On regular/thins diet, Mod language with expressive > receptive deficits. Functional memory.   2.  Antithrombotics: -DVT/anticoagulation:  Mechanical:  Sequential compression devices, below knee Bilateral lower extremities Pharmaceutical: sq Heparin  5000 q8             -antiplatelet therapy: Asprin 81 mg daily    1/11 continue to hold heparin  given recent bleeding around PEG site.  H&H were stable.  SCDs ordered, teds.    -she's walking 175'+ with therapy, so sq hep no longer needed  3. Pain Management: history of peripheral neuropathy Gabapentin   cut back to 100 mg q12 d/t encephalopathy.   PRN tylenol  and tramadol .  -1/6: Right knee pain for approximately 1 week, will get x-ray, Voltaren  gel 4 times daily, depending on findings may need injection--xray with Minimal tibial spine spur formation. Mild anterior patellar enthesophyte formation at the quadriceps tendon insertion. - 1/7: Robaxin  500 mg TID for muscle cramping in quads  1-9: Significant bilateral lower extremity sensitivity/pain.  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  - Increase gabapentin  to 200 mg twice daily; hesitant to increase further given AKI  - Resume Requip  1 mg nightly  - Resume duloxetine  20 mg daily  - Will increase Robaxin  to 750 mg 3 times daily--hesitant to switch to home tizanidine given risk of sedation but may consider this in the future   1/11  sl improvement in pain. Really can't push gabapentin  any more   -continue lidocaine  patches   -hopefully duloxetine  and requip  will help as well   1/12: Reduce gabapentin  to 100 mg 3 times daily due to AKI  1-13: Schedule tramadol  50 mg every 6 hours for pain reduction.  Reviewed CT abdomen pelvis December 24, some degenerative disks most severe at L5-S1 and L2-3, with some encroachment on the right L5-S1 neuroforamina.  Otherwise, spaces well-maintained, no obvious spinal stenosis.  1-14: Pain control improved.  Monitor.  4. Mood/Behavior/Sleep: LCSW to follow for evaluation and support when  available.              -antipsychotic agents: N/A             -Continue Ritalin  5 mg a.m. to encourage alertness     1-8: Mirtazapine  7.5 mg nightly added yesterday for sleep and appetite stimulation; patient overly lethargic today, will DC mirtazapine  to see if this improves  - DC'ed mirtazapine  due to lethargy, nausea.  Symptoms significantly improved.  1-13: Significant difficulties with motivation and fatigue ongoing. DC of Ritalin .  Will likely take a while to respond to resumption of home duloxetine  as above.  1-14: Daytime alertness, motivation did significantly respond to DC Ritalin .  Will resume.  5. Neuropsych/cognition: This patient is capable of making decisions on her own behalf.   6. Skin/Wound Care: Routine pressure relief measures.   - Surgical sites look good   7. Fluids/Electrolytes/Nutrition/dysphagia: Monitor I&O and weight. Follow up labs CBC/CMP               Oropharyngeal dysphagia: Dysphagia: Secondary to CVA/anoxic injury.  -PEG placed on 12/30-- -Pt to receive a can of osmolite if eats less than 50% of meal -continue protein supp and free water  -consider appetite stimulant  1/6: Severe constipation contributor to nausea and poor meal intake; nutrition consult today.  Treating constipation as below.  1-7: Converting to bolus feeds if less than 50% meal completion via PEG tube.  Hopeful for p.o. improvement now that she has had a bowel movement  1-11 didn't eat much yesterday given nausea but appetite has been picking up. Hopefully will do better today  1-12: Restart IV fluids as below 1-13: P.o. intakes improving.  Supplement potassium chloride  20 mill equivalents twice daily for 2 days. 1-14: P.o. intake is very poor yesterday.  Resume Ritalin .  Start Megace  400 mg daily.  8. Incisional hernia repair reexploration 12/4 and 12/5 c/b hematoma:  -Gen Surgery continuing to follow as needed.    9. Acute bilateral cerebellar/cerebral infarct secondary  to profound  intracranial hypoperfusion due to hemorrhagic shock/hypotension:  -Continue Asprin. Follow up with [facility] outpatient.    10. Acute metabolic encephalopathy: seems much improved, continue to monitor-delirium precautions.    11. ABLA: secondary to hemorrhagic shock/critical illness. Hemoglobin 8.8 (12/31). Monitor CBC.    - 1/6: hemoglobin stable on admission labs, add daily iron supplement 325 mg to assist in RBC repletion.  Add on iron levels and TIBC to labs today--WNL  1-8: DC iron supplement due to stomach upset, nausea  1-9: Bleeding around PEG tube site today.  H&H stable 8.5.  IR evaluated, appreciate recommendations.  Patient has abdominal binder to keep PEG in place when out of bed.  1/10-11 PEG site stable this w/e  12. AKI on CKD stage IIIb: continue to monitor renal function.  -Right renal infarct: incidental finding on CT imaging on 12/4-likely due to hypoperfusion in setting of hypotension. Continue supportive care.    - Creatinine stable 1.5, BUN remains elevated 42, encourage p.o. fluids.   1-8: Creatinine 1.6 today, BUN 40.  Discussed need to eat fluid intake with patient.  Depending on I's and O's today, may give IV fluids tomorrow.  1-9: Creatinine 1.9, 500 cc fluid bolus followed by 50 cc/h normal saline for 1 day.  BMP in AM.  Discussed increasing p.o. fluids with patient, husband.  1/11 BUN/Cr 43/1.78 yesterday, sl improvement   -continue to push fluids   -resume IVF if needed. Continue flushes thru PEG   -recheck labs Monday   1-12: BUN/creatinine remain elevated, stable.  Give 1 L IV fluid today, increase PEG tube flushes to every 4 hours 200 cc.  Repeat in AM.  1-13: BUN normalized, creatinine stable 1.6.  DC IV fluids, monitor today.  14. HCAP (Enterobacter cloacae on tracheal aspirate culture): Completed antibiotic course.    15. Hypertension: monitor BP per protocol on Amlodipine  10 mg, Coreg  12.5 mg, and Hydralazine  50 mg.    - Blood pressure stable, monitor  -  1-13: Systolic BP up, diastolic remains stable.  Increase hydralazine  to 75 mg 3 times daily  1-14: BP elevated this a.m.  Prior to medications.  Increase Coreg  to 25 mg twice daily, monitor for bradycardia.    12/04/2024    5:57 AM 12/03/2024    7:57 PM 12/03/2024   12:32 PM  Vitals with BMI  Systolic 176 155 850  Diastolic 77 63 60  Pulse 91 77 73    16. Hypothyrodism: continue Synthroid .    17. 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 Serene)             -on Aspirin     18.  Severe constipation status post multiple intra-abdominal surgeries.  - Last bowel movement 12/28  - KUB today with large stool burden--no signs of obstruction--gave sorbitol  30 mL  Large bowel movement 1-7 with sorbitol ; increase MiraLAX  to twice daily and monitor  Last bowel movement 1-12, large  19.  Nausea.  Encouraged use of as needed Zofran .  On Protonix .,  Will increase to twice daily AC.  - 1/11 recurrent yesterday   -d/w nurse, making sure we pretreat with zofran  before meds   -encourage eating something before taking meds as well   -she's moving bowels  1-12: Improved LOS: 9 days A FACE TO FACE EVALUATION WAS PERFORMED  Erin [name] Likes 12/04/2024, 7:38 AM     "

## 2024-12-04 NOTE — Progress Notes (Signed)
 Physical Therapy Weekly Progress Note  Patient Details  Name: Erin [name] MRN: [id number] Date of Birth: 1956/11/16  Beginning of progress report period: November 26, 2024 End of progress report period: December 04, 2024  Today's Date: 12/04/2024 PT Individual Time: 8984-8885 PT Individual Time Calculation (min): 59 min   Patient has met 0 of 3 short term goals. Pt is progressing toward mobility goals, but has not performed consistently enough to meet short term goals this reporting session. Pt is able to perform all mobility with CGA at times, but fluctuates in effort and performance, and can require up to modA to maxA with frequent LOBs and little to no righting reactions. Pt will benefit from hands on family education prior to discharge  Patient continues to demonstrate the following deficits muscle weakness, decreased cardiorespiratoy endurance, decreased coordination and decreased motor planning, decreased attention to right, decreased awareness, decreased problem solving, and decreased safety awareness, and decreased sitting balance, decreased standing balance, decreased postural control, and decreased balance strategies and therefore will continue to benefit from skilled PT intervention to increase functional independence with mobility.  Patient progressing toward long term goals..  Continue plan of care.  PT Short Term Goals Week 1:  PT Short Term Goal 1 (Week 1): Pt will complete sit to stand with CGA PT Short Term Goal 1 - Progress (Week 1): Progressing toward goal PT Short Term Goal 2 (Week 1): Pt will complete bed to chair with CGA. PT Short Term Goal 2 - Progress (Week 1): Progressing toward goal PT Short Term Goal 3 (Week 1): Pt will ambulate x150' with CGA and LRAD. PT Short Term Goal 3 - Progress (Week 1): Progressing toward goal Week 2:  PT Short Term Goal 1 (Week 2): STGs = LTGs  Skilled Therapeutic Interventions/Progress Updates:  Ambulation/gait training;Community  reintegration;DME/adaptive equipment instruction;Psychosocial support;Neuromuscular re-education;Stair training;UE/LE Strength taining/ROM;Balance/vestibular training;Discharge planning;Functional electrical stimulation;Skin care/wound management;Therapeutic Activities;UE/LE Coordination activities;Pain management;Cognitive remediation/compensation;Disease management/prevention;Functional mobility training;Patient/family education;Splinting/orthotics;Therapeutic Exercise;Visual/perceptual remediation/compensation   Pt received supine in bed asleep. Awakens slowly and agrees to therapy. Reports nausea and some pain in legs, but says it is improved from yesterday. Husband provides topical gel to feet to address pain and PT provides rest break as needed. Supine to sit with bed features. Pt's husband assists to don shoes and pt performs stand step transfer to [facility] with CGA and cues for positioning. WC transport to gym. Pt performs sit to stand with RW and cues for hand placement. Pt ambulates x100' with RW and CGA, with cues for upright gaze to improve posture and balance and increasing step height to prevent foot drag. Seated rest break. Pt then performs alternating foot taps on 4' step x20, x6 x20, and x8 x20, with seated rest breks between each bout. PT provides cues for posture and body mechanics. Pt becomes nauseous during activity and requires extended rest for nausea to pass. Pt progresses to performing step ups, initially x10 on 6 step with LLE ascending and RLE descending backward. PT provides CGA and cues for glute engagement to promote increased hip and trunk extension. Following, pt completes x10 with RLE ascending.   Pt ambulates x100' without AD to challenge coordination and balance. Pt completes with light minA for postural stability, with cues to increase trunk rotation and arm swing to improve balance. Pt demonstrates much improved safety and awareness relative to previous session with this  therapist. Following rest break, pt ambulates x150' back to room, initially with CGA and then minA at she fatigues and  approaches bed. Pt left seated at EOB with all needs within reach.   Therapy Documentation Precautions:  Precautions Precautions: Fall Recall of Precautions/Restrictions: Impaired Precaution/Restrictions Comments: Peg Tube; R-sided hemiparesis Restrictions Weight Bearing Restrictions Per Provider Order: No  Therapy/Group: Individual Therapy  Elsie JAYSON Dawn, PT, DPT 12/04/2024, 4:27 PM

## 2024-12-04 NOTE — Progress Notes (Signed)
 Speech Language Pathology Daily Session Note  Patient Details  Name: Erin [name] MRN: [id number] Date of Birth: 10/21/1956  Today's Date: 12/04/2024 SLP Individual Time: 8569-8472 SLP Individual Time Calculation (min): 57 min  Short Term Goals: Week 2: SLP Short Term Goal 1 (Week 2): STGs = LTGs d/t ELOS  Skilled Therapeutic Interventions: Skilled therapy session focused on communication goals. SLP facilitated session by prompting patient to participate in word description and responsive naming task. Patient completed activity with minA and occasional instances of word finding/paraphasias at the conversational level. Lastly, SLP targeted receptive language through complex yes/no questions. Patient with 100% accuracy independently. Patient left in bed with alarm set and call bell in reach. Continue POC  Pain denies  Therapy/Group: Individual Therapy  Cimone Fahey M.A., CCC-SLP 12/04/2024, 7:44 AM

## 2024-12-04 NOTE — Progress Notes (Signed)
 Occupational Therapy Session Note  Patient Details  Name: Erin [name] MRN: [id number] Date of Birth: [birth year]  Today's Date: 12/04/2024 OT Individual Time: 8665-8567 OT Individual Time Calculation (min): 58 min    Short Term Goals: Week 2:  OT Short Term Goal 1 (Week 2): STG= LTG d/t ELOS  Skilled Therapeutic Interventions/Progress Updates:   Pt received supine with no c/o pain, agreeable to OT session. She declined ADLs. She came to EOB with (S). Cueing for hand placement and then pt was able to stand on second attempt with CGA using the RW. She completed 100 ft of functional mobility to the therapy gym with CGA using the RW. Challenged dynamic weight shifting, righting reactions of the ankle and hip, and postural control/stability using the BITS balance sensor on her chest. She required mod A overall d/t poor righting reactions and effort at times. Pt required use of rest breaks throughout session for recovery, as well as to support safety and prevent overexertion. During breaks, OT monitored recovery time to assess endurance and response to exertion. She then completed 3x8 repetitions of hip hinges to address hip righting reactions and posterior chain activation. She returned to her room via 50 ft of functional mobility with the RW with CGA. Pt was left supine with all needs met, bed alarm set and call bell within reach.   Therapy Documentation Precautions:  Precautions Precautions: Fall Recall of Precautions/Restrictions: Impaired Precaution/Restrictions Comments: Peg Tube; R-sided hemiparesis Restrictions Weight Bearing Restrictions Per Provider Order: No  Therapy/Group: Individual Therapy  Erin [name] 12/04/2024, 10:34 AM

## 2024-12-04 NOTE — Progress Notes (Signed)
 Physical Therapy Session Note  Patient Details  Name: Erin [name] MRN: [id number] Date of Birth: [birth year]  Today's Date: 12/04/2024      Short Term Goals: Week 1:  PT Short Term Goal 1 (Week 1): Pt will complete sit to stand with CGA PT Short Term Goal 2 (Week 1): Pt will complete bed to chair with CGA. PT Short Term Goal 3 (Week 1): Pt will ambulate x150' with CGA and LRAD.  Skilled Therapeutic Interventions/Progress Updates: Upon this therapist's arrival husband flushing PEG tube. Pt agreeable to therapy but stating nauseated. Pt completed supine to sit with supervision c/o/ increased nausea and then with several episodes of emesis. Nsg arrived for meds, pt continued with significant nausea. Pt missed 30 min skilled PT due to N/V. Will reattempt as schedule permits.      Therapy Documentation Precautions:  Precautions Precautions: Fall Recall of Precautions/Restrictions: Impaired Precaution/Restrictions Comments: Peg Tube; R-sided hemiparesis Restrictions Weight Bearing Restrictions Per Provider Order: No General: PT Amount of Missed Time (min): 30 Minutes PT Missed Treatment Reason: Patient ill (Comment)  Therapy/Group: Individual Therapy  Nanda Bittick 12/04/2024, 9:09 AM

## 2024-12-05 ENCOUNTER — Inpatient Hospital Stay (HOSPITAL_COMMUNITY)

## 2024-12-05 LAB — CBC WITH DIFFERENTIAL/PLATELET
Abs Immature Granulocytes: 0.04 K/uL (ref 0.00–0.07)
Basophils Absolute: 0 K/uL (ref 0.0–0.1)
Basophils Relative: 0 %
Eosinophils Absolute: 0.1 K/uL (ref 0.0–0.5)
Eosinophils Relative: 2 %
HCT: 26.3 % — ABNORMAL LOW (ref 36.0–46.0)
Hemoglobin: 8.7 g/dL — ABNORMAL LOW (ref 12.0–15.0)
Immature Granulocytes: 1 %
Lymphocytes Relative: 22 %
Lymphs Abs: 1.4 K/uL (ref 0.7–4.0)
MCH: 31.8 pg (ref 26.0–34.0)
MCHC: 33.1 g/dL (ref 30.0–36.0)
MCV: 96 fL (ref 80.0–100.0)
Monocytes Absolute: 0.8 K/uL (ref 0.1–1.0)
Monocytes Relative: 12 %
Neutro Abs: 4 K/uL (ref 1.7–7.7)
Neutrophils Relative %: 63 %
Platelets: 284 K/uL (ref 150–400)
RBC: 2.74 MIL/uL — ABNORMAL LOW (ref 3.87–5.11)
RDW: 14.6 % (ref 11.5–15.5)
WBC: 6.4 K/uL (ref 4.0–10.5)
nRBC: 0 % (ref 0.0–0.2)

## 2024-12-05 LAB — GLUCOSE, CAPILLARY
Glucose-Capillary: 87 mg/dL (ref 70–99)
Glucose-Capillary: 106 mg/dL — ABNORMAL HIGH (ref 70–99)
Glucose-Capillary: 108 mg/dL — ABNORMAL HIGH (ref 70–99)
Glucose-Capillary: 91 mg/dL (ref 70–99)

## 2024-12-05 LAB — BASIC METABOLIC PANEL WITH GFR
Anion gap: 10 (ref 5–15)
BUN: 19 mg/dL (ref 8–23)
CO2: 23 mmol/L (ref 22–32)
Calcium: 9.5 mg/dL (ref 8.9–10.3)
Chloride: 102 mmol/L (ref 98–111)
Creatinine, Ser: 1.75 mg/dL — ABNORMAL HIGH (ref 0.44–1.00)
GFR, Estimated: 31 mL/min — ABNORMAL LOW
Glucose, Bld: 90 mg/dL (ref 70–99)
Potassium: 4.1 mmol/L (ref 3.5–5.1)
Sodium: 135 mmol/L (ref 135–145)

## 2024-12-05 MED ORDER — SENNOSIDES-DOCUSATE SODIUM 8.6-50 MG PO TABS
1.0000 | ORAL_TABLET | Freq: Every day | ORAL | Status: DC
  Administered 2024-12-07: 1 via ORAL
  Filled 2024-12-05 (×4): qty 1

## 2024-12-05 MED ORDER — FREE WATER
100.0000 mL | Freq: Four times a day (QID) | Status: DC
  Administered 2024-12-05 – 2024-12-07 (×9): 100 mL

## 2024-12-05 MED ORDER — TRAMADOL HCL 50 MG PO TABS
25.0000 mg | ORAL_TABLET | Freq: Four times a day (QID) | ORAL | Status: DC | PRN
  Administered 2024-12-07 – 2024-12-08 (×3): 25 mg via ORAL
  Filled 2024-12-05 (×3): qty 1

## 2024-12-05 MED ORDER — ONDANSETRON HCL 4 MG/2ML IJ SOLN
4.0000 mg | Freq: Three times a day (TID) | INTRAMUSCULAR | Status: DC
  Filled 2024-12-05: qty 2

## 2024-12-05 MED ORDER — TRAMADOL HCL 50 MG PO TABS: 25.0000 mg | ORAL_TABLET | Freq: Four times a day (QID) | ORAL | Status: DC

## 2024-12-05 MED ORDER — ONDANSETRON 4 MG PO TBDP
4.0000 mg | ORAL_TABLET | Freq: Three times a day (TID) | ORAL | Status: DC
  Administered 2024-12-05 – 2024-12-10 (×15): 4 mg via ORAL
  Filled 2024-12-05 (×16): qty 1

## 2024-12-05 NOTE — Progress Notes (Signed)
 Occupational Therapy Session Note  Patient Details  Name: Erin [name] MRN: [id number] Date of Birth: 1955-12-12  Today's Date: 12/05/2024 OT Individual Time: 1347-1440 OT Individual Time Calculation (min): 53 min    Short Term Goals: Week 2:  OT Short Term Goal 1 (Week 2): STG= LTG d/t ELOS  Skilled Therapeutic Interventions/Progress Updates:    Patient received seated in wheelchair with husband at her side.  Patient agreeable to OT and to work on balance in home setting.  Patient walked to ADL apartment with CGA to close supervision.  Patient's walking balance directly correlates with her attention to task.  Patient does hit obstacles on right side with little anticipation.   Worked on bed mobility, shower stall transfers - simulating her bathroom/ shower, furniture transfers - intermittent cueing to not pull up on walker to stand.   Worked on use of walker for opening doors, drawers, etc.  Patient responsive to cueing, but does not consistently carryover. Husband present throughout session and making comments about how pleased he is with her progress.   Patient returned to bed at end of session.  Bed alarm engaged and husband at bedside.    Therapy Documentation Precautions:  Precautions Precautions: Fall Recall of Precautions/Restrictions: Impaired Precaution/Restrictions Comments: Peg Tube; R-sided hemiparesis Restrictions Weight Bearing Restrictions Per Provider Order: No   Vital Signs: Therapy Vitals Temp: (!) 97 F (36.1 C) Pulse Rate: 72 Resp: 16 BP: 132/72 Patient Position (if appropriate): Sitting Oxygen Therapy SpO2: 100 % O2 Device: Room Air Pain: Pain Assessment Pain Scale: 0-10 Pain Score: 0-No pain     Therapy/Group: Individual Therapy  [name] M 12/05/2024, 2:41 PM

## 2024-12-05 NOTE — Plan of Care (Signed)
" °  Problem: Consults Goal: RH STROKE PATIENT EDUCATION Description: See Patient Education module for education specifics  Outcome: Progressing   Problem: RH BOWEL ELIMINATION Goal: RH STG MANAGE BOWEL WITH ASSISTANCE Description: STG Manage Bowel with supervision Assistance. Outcome: Progressing   Problem: RH BLADDER ELIMINATION Goal: RH STG MANAGE BLADDER WITH ASSISTANCE Description: STG Manage Bladder With supervision Assistance Outcome: Progressing   Problem: RH SKIN INTEGRITY Goal: RH STG SKIN FREE OF INFECTION/BREAKDOWN Description: Manage skin free of infection with supervision assistance Outcome: Progressing   Problem: RH SAFETY Goal: RH STG ADHERE TO SAFETY PRECAUTIONS W/ASSISTANCE/DEVICE Description: STG Adhere to Safety Precautions With Assistance/Device. Outcome: Progressing   Problem: RH PAIN MANAGEMENT Goal: RH STG PAIN MANAGED AT OR BELOW PT'S PAIN GOAL Description: <4 w/ prns Outcome: Progressing   Problem: RH KNOWLEDGE DEFICIT Goal: RH STG INCREASE KNOWLEDGE OF HYPERTENSION Description: Manage increase knowledge of hypertension with supervision assistance from husband using educational materials provided Outcome: Progressing Goal: RH STG INCREASE KNOWLEDGE OF DYSPHAGIA/FLUID INTAKE Description: Manage increase knowledge of dysphagia with supervision assistance from husband using educational materials provided Outcome: Progressing Goal: RH STG INCREASE KNOWLEDGE OF STROKE PROPHYLAXIS Description: Manage increase knowledge of stroke prophylaxis with supervision assistance from husband using educational materials provided Outcome: Progressing   "

## 2024-12-05 NOTE — Progress Notes (Addendum)
 "                                                        PROGRESS NOTE   Subjective/Complaints: No events overnight.  Significant complaints of nausea today, she states that it is consistently associated with fluid boluses through the feeding tube.  Having significant difficulty tolerating food because of this. Remains slightly hypertensive last night this a.m.  Other vitals are stable. Multiple Smear bowel movements yesterday with Senokot.. Per therapy notes yesterday, much improved safety awareness and energy, ambulated without an assistive device up to 150 feet alternating from contact-guard assist to min assist  ROS: Patient denies fever, rash, sore throat, blurred vision, dizziness,  diarrhea, cough, shortness of breath or chest pain, joint or back/neck pain, headache, or mood change.  + Bilateral lower extremity pain + Fatigue + Nausea  Objective:   No results found.  Recent Labs    12/05/24 0523  WBC 6.4  HGB 8.7*  HCT 26.3*  PLT 284   Recent Labs    12/03/24 0509 12/05/24 0523  NA 137 135  K 3.4* 4.1  CL 104 102  CO2 23 23  GLUCOSE 87 90  BUN 23 19  CREATININE 1.65* 1.75*  CALCIUM  8.9 9.5    Intake/Output Summary (Last 24 hours) at 12/05/2024 0938 Last data filed at 12/05/2024 9371 Gross per 24 hour  Intake 718 ml  Output --  Net 718 ml        Physical Exam: Vital Signs Blood pressure (!) 164/72, pulse 74, temperature 98.7 F (37.1 C), resp. rate 18, height 5' 3 (1.6 m), weight 44.6 kg, SpO2 96%.   Constitutional: No distress . Vital signs reviewed.  Laying in bed. HEENT: NCAT, EOMI, oral membranes moist Neck: supple Cardiovascular: RRR without murmur. No JVD    Respiratory/Chest: CTA Bilaterally without wheezes or rales. Normal effort    GI/Abdomen: BS +, non-tender, non-distended, PEG site c/d/I--unchanged Ext: no clubbing, cyanosis, or edema Psych: Lethargic, somewhat sedated.  Cooperative. Skin:   - hernia repair site with steri strips,  healing well, well-approximated - PEG site clean, dry, intact.  MSK:      No apparent deformity. + TTP in the bilateral knees and proximal thighs now, equal bilaterally --unchanged       Neurologic exam:  Cognition: AAO to person, place, and time.   Follows commands appropriately. + Mild dysarthria-no more  notable substitutions or difficulty word finding Memory: Poor, but improving Insight: Fair insight into current condition.  Sensation: Allodynia to palpation of the bilateral proximal thighs, otherwise intact  CN: 2-12 grossly intact.   Coordination: BL UE and LE ataxia--no further notable motor apraxia Spasticity: MAS 0 in all extremities.   Strength: 4+/ 5 proximal left upper and lower extremity, otherwise 5-/5 throughout-    Assessment/Plan: 1. Functional deficits which require 3+ hours per day of interdisciplinary therapy in a comprehensive inpatient rehab setting. Physiatrist is providing close team supervision and 24 hour management of active medical problems listed below. Physiatrist and rehab team continue to assess barriers to discharge/monitor patient progress toward functional and medical goals  Care Tool:  Bathing    Body parts bathed by patient: Right arm, Chest, Abdomen, Front perineal area, Right upper leg, Left upper leg, Left arm, Right lower leg, Left lower leg, Face  Body parts bathed by helper: Buttocks, Left arm, Right lower leg, Left lower leg     Bathing assist Assist Level: Minimal Assistance - Patient > 75%     Upper Body Dressing/Undressing Upper body dressing   What is the patient wearing?: Pull over shirt    Upper body assist Assist Level: Set up assist    Lower Body Dressing/Undressing Lower body dressing      What is the patient wearing?: Underwear/pull up, Pants     Lower body assist Assist for lower body dressing: Contact Guard/Touching assist     Toileting Toileting    Toileting assist Assist for toileting: Moderate  Assistance - Patient 50 - 74%     Transfers Chair/bed transfer  Transfers assist     Chair/bed transfer assist level: Minimal Assistance - Patient > 75%     Locomotion Ambulation   Ambulation assist      Assist level: 2 helpers Assistive device: No Device Max distance: 130'   Walk 10 feet activity   Assist     Assist level: Minimal Assistance - Patient > 75% Assistive device: No Device   Walk 50 feet activity   Assist    Assist level: 2 helpers Assistive device: No Device    Walk 150 feet activity   Assist Walk 150 feet activity did not occur: Safety/medical concerns         Walk 10 feet on uneven surface  activity   Assist     Assist level: Minimal Assistance - Patient > 75%     Wheelchair     Assist Is the patient using a wheelchair?: Yes Type of Wheelchair: Manual    Wheelchair assist level: Dependent - Patient 0% Max wheelchair distance: 150'    Wheelchair 50 feet with 2 turns activity    Assist        Assist Level: Dependent - Patient 0%   Wheelchair 150 feet activity     Assist      Assist Level: Dependent - Patient 0%   Blood pressure (!) 164/72, pulse 74, temperature 98.7 F (37.1 C), resp. rate 18, height 5' 3 (1.6 m), weight 44.6 kg, SpO2 96%.   Medical Problem List and Plan: 1. 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 Min A to Mod A transfers, worse without walker. Poor safety awareness - expects someone will help her if she launches at a chair or the floor. CGA to Min A with a walker 175 ft. On regular/thins diet, Mod language with expressive > receptive deficits. Functional memory.   2.  Antithrombotics: -DVT/anticoagulation:   Mechanical: Sequential compression devices, below knee Bilateral lower extremities Pharmaceutical: sq Heparin  5000 q8             -antiplatelet therapy: Asprin 81 mg daily    1/11 continue to hold heparin  given recent bleeding around PEG site.  H&H were stable.  SCDs ordered, teds.    -she's walking 175'+ with therapy, so sq hep no longer needed  3. Pain Management: history of peripheral neuropathy Gabapentin  cut back to 100 mg q12 d/t encephalopathy.   PRN tylenol  and tramadol .  -  1/6: Right knee pain for approximately 1 week, will get x-ray, Voltaren  gel 4 times daily, depending on findings may need injection--xray with Minimal tibial spine spur formation. Mild anterior patellar enthesophyte formation at the quadriceps tendon insertion. - 1/7: Robaxin  500 mg TID for muscle cramping in quads  1-9: Significant bilateral lower extremity sensitivity/pain.  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  - Increase gabapentin  to 200 mg twice daily; hesitant to increase further given AKI  - Resume Requip  1 mg nightly  - Resume duloxetine  20 mg daily  - Will increase Robaxin  to 750 mg 3 times daily--hesitant to switch to home tizanidine given risk of sedation but may consider this in the future   1/11  sl improvement in pain. Really can't push gabapentin  any more   -continue lidocaine  patches   -hopefully duloxetine  and requip  will help as well   1/12: Reduce gabapentin  to 100 mg 3 times daily due to AKI  1-13: Schedule tramadol  50 mg every 6 hours for pain reduction.  Reviewed CT abdomen pelvis December 24, some degenerative disks most severe at L5-S1 and L2-3, with some encroachment on the right L5-S1 neuroforamina.  Otherwise, spaces well-maintained, no obvious spinal stenosis.  1-14: Pain control improved.  Monitor.  1-15: Some concern tramadol  may be causing more lethargy and  nausea.  Reduce to 25 mg every 6 hours as needed.   4. Mood/Behavior/Sleep: LCSW to follow for evaluation and support when available.              -antipsychotic agents: N/A             -Continue Ritalin  5 mg a.m. to encourage alertness     1-8: Mirtazapine  7.5 mg nightly added yesterday for sleep and appetite stimulation; patient overly lethargic today, will DC mirtazapine  to see if this improves  - DC'ed mirtazapine  due to lethargy, nausea.  Symptoms significantly improved.  1-13: Significant difficulties with motivation and fatigue ongoing. DC of Ritalin .  Will likely take a while to respond to resumption of home duloxetine  as above.  1-14: Daytime alertness, motivation did significantly respond to DC Ritalin .  Will resume.--With improvement 1-15  5. Neuropsych/cognition: This patient is capable of making decisions on her own behalf.   6. Skin/Wound Care: Routine pressure relief measures.   - Surgical sites look good   7. Fluids/Electrolytes/Nutrition/dysphagia: Monitor I&O and weight. Follow up labs CBC/CMP               Oropharyngeal dysphagia: Dysphagia: Secondary to CVA/anoxic injury.  -PEG placed on 12/30-- -Pt to receive a can of osmolite if eats less than 50% of meal -continue protein supp and free water  -consider appetite stimulant  1/6: Severe constipation contributor to nausea and poor meal intake; nutrition consult today.  Treating constipation as below.  1-7: Converting to bolus feeds if less than 50% meal completion via PEG tube.  Hopeful for p.o. improvement now that she has had a bowel movement  1-11 didn't eat much yesterday given nausea but appetite has been picking up. Hopefully will do better today  1-12: Restart IV fluids as below 1-13: P.o. intakes improving.  Supplement potassium chloride  20 mill equivalents twice daily for 2 days. 1-14: P.o. intake is very poor yesterday.  Resume Ritalin .  Start Megace  400 mg daily. 1-15: Ate only 15% of 1 meal yesterday.   Fluid flushes to 100 cc every 6 hours.  Patient knows to drink more to keep up with this reduction, see #19 below.  8. Incisional hernia repair reexploration 12/4 and 12/5 c/b hematoma:  -Gen Surgery continuing to follow as needed.    9. Acute bilateral cerebellar/cerebral infarct secondary to profound intracranial hypoperfusion due to hemorrhagic shock/hypotension:  -Continue Asprin. Follow up with [facility] outpatient.    10. Acute metabolic encephalopathy: seems much improved, continue to monitor-delirium precautions.    11. ABLA: secondary to hemorrhagic shock/critical illness. Hemoglobin 8.8 (12/31). Monitor CBC.    - 1/6: hemoglobin stable on admission labs, add daily iron supplement 325 mg to assist in RBC repletion.  Add on iron levels and TIBC to labs today--WNL  1-8: DC iron supplement due to stomach upset, nausea  1-9: Bleeding around PEG tube site today.  H&H stable 8.5.  IR evaluated, appreciate recommendations.  Patient has abdominal binder to keep PEG in place when out of bed.  1/10-11 PEG site stable this w/e  12. AKI on CKD stage IIIb: continue to monitor renal function.  -Right renal infarct: incidental finding on CT imaging on 12/4-likely due to hypoperfusion in setting of hypotension. Continue supportive care.    - Creatinine stable 1.5, BUN remains elevated 42, encourage p.o. fluids.   1-8: Creatinine 1.6 today, BUN 40.  Discussed need to eat fluid intake with patient.  Depending on I's and O's today, may give IV fluids tomorrow.  1-9: Creatinine 1.9, 500 cc fluid bolus followed by 50 cc/h normal saline for 1 day.  BMP in AM.  Discussed increasing p.o. fluids with patient, husband.  1/11 BUN/Cr 43/1.78 yesterday, sl improvement   -continue to push fluids   -resume IVF if needed. Continue flushes thru PEG   -recheck labs Monday   1-12: BUN/creatinine remain elevated, stable.  Give 1 L IV fluid today, increase PEG tube flushes to every 4 hours 200 cc.  Repeat in AM.  1-13: BUN  normalized, creatinine stable 1.6.  DC IV fluids, monitor today.  - BUN continues to downtrend, creatinine stable 1.5-1.7.  Likely representing new baseline, monitor with adjustment of fluid flushes  14. HCAP (Enterobacter cloacae on tracheal aspirate culture): Completed antibiotic course.    15. Hypertension: monitor BP per protocol on Amlodipine  10 mg, Coreg  12.5 mg, and Hydralazine  50 mg.    - Blood pressure stable, monitor  - 1-13: Systolic BP up, diastolic remains stable.  Increase hydralazine  to 75 mg 3 times daily  1-14: BP elevated this a.m.  Prior to medications.  Increase Coreg  to 25 mg twice daily, monitor for bradycardia.  1-15: Blood pressure remains elevated, slightly better than it was yesterday.  Monitor 1 to 2 days with recent changes    12/05/2024    6:27 AM 12/05/2024    6:26 AM 12/04/2024    8:27 PM  Vitals with BMI  Weight 98 lbs 5 oz    BMI 17.42    Systolic  164 152  Diastolic  72 69  Pulse  74 80    16. Hypothyrodism: continue Synthroid .    17. 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 Serene)             -on Aspirin     18.  Severe constipation status post multiple intra-abdominal surgeries.  - Last bowel movement 12/28  - KUB today with large stool burden--no signs of obstruction--gave sorbitol  30 mL  Large bowel movement 1-7 with sorbitol ; increase MiraLAX  to twice daily and monitor  Last  bowel movement 1-12, large; a few Smear bowel movements 1-14  19.  Nausea.  Encouraged use of as needed Zofran .  On Protonix .,  Will increase to twice daily AC.  - 1/11 recurrent yesterday   -d/w nurse, making sure we pretreat with zofran  before meds   -encourage eating something before taking meds as well   -she's moving bowels  1-15: KUB without concerning findings.  Reduce free water  flushes as above as volume has been exacerbating.  Schedule Zofran  4 mg 3 times daily with meals  LOS: 10 days A FACE TO FACE EVALUATION WAS  PERFORMED  Joesph JAYSON Likes 12/05/2024, 9:38 AM     "

## 2024-12-05 NOTE — Progress Notes (Signed)
 Physical Therapy Session Note  Patient Details  Name: Erin [name] MRN: [id number] Date of Birth: 1956/07/31  Today's Date: 12/05/2024 PT Individual Time: 0805-0900 PT Individual Time Calculation (min): 55 min   Today's Date: 12/05/2024 PT Individual Time: 1300-1330 PT Individual Time Calculation (min): 30 min   Short Term Goals: Week 2:  PT Short Term Goal 1 (Week 2): STGs = LTGs  Skilled Therapeutic Interventions/Progress Updates:     Pt received seated in WC and agrees to therapy. Reports pain in neck, unrated. PT provide gentle ROM and rest breaks as needed to manage pain. WC transport to gym. Pt performs stand step to matt able with CGA and cues for initiation. Pt performs x5 reps sit to stand, initially with CGA and one instance of minA due to LOB to the Lt. PT provides cues to widen base of support for improved balance. Pt  performs slow cervical rotations Rt<>Lt and vertically to address neck pain. Pt then performs 2x5 reps sit to stand while holding onto 2kg medicine ball to engage core and provide increased balance challenge, with seated rest between sets. Pt ambulates without AD to challenge dynamic balance. Pt ambulates 3x70' with minA primarily, but requiring up to maxA when pt fatigues or appears to lose concentration, having complete LOBs and/or buckling in legs. Following rest break, pt ambulates x175' with RW, with CGA initially and then minA as she fatigues, with pt flexing forward and demonstrating unsafe RW management. Pt ambulates additional x100' with RW and similar cues and assistance provided. Pt left seated in WC with all needs within reach.  2nd Session: Pt received seated in [facility] and agrees to therapy. No complaint of pain. WC transport to gym. Pt ambulates x100' with RW and CGA, with cues for posture and safe AD management. Pt endorses that she would like to attempt ambulation without AD. Following seated rest break. Pt stands and ambulates x100' without AD and with  CGA and no LOBs. Pt takes brief seated rest break then ambulates x175' without AD, with CGA/minA but much improved stability and safety relative to AM session with this therapist. Pt ambulates additional 2x100' with CGA primarily, but one instance of modA due to LOB when pt has to take unexpected turn. Pt ambulates up/down 8 3 steps and 4 6 steps with bilateral handrails and CGA. Seated rest break. Pt completes additional bout of stair training, and requires minA/modA as she fatigues. WC transport back to room. Left seated with all needs within reach.   Therapy Documentation Precautions:  Precautions Precautions: Fall Recall of Precautions/Restrictions: Impaired Precaution/Restrictions Comments: Peg Tube; R-sided hemiparesis Restrictions Weight Bearing Restrictions Per Provider Order: No  Therapy/Group: Individual Therapy  Elsie JAYSON Dawn, PT, DPT 12/05/2024, 4:28 PM

## 2024-12-05 NOTE — Progress Notes (Signed)
 Speech Language Pathology Daily Session Note  Patient Details  Name: Erin [name] MRN: [id number] Date of Birth: 1956/06/24  Today's Date: 12/05/2024 SLP Individual Time: 0900-0958 SLP Individual Time Calculation (min): 58 min  Short Term Goals: Week 2: SLP Short Term Goal 1 (Week 2): STGs = LTGs d/t ELOS  Skilled Therapeutic Interventions: Skilled therapy session focused on cognitive goals. SLP facilitated session by targeting orientation. Patient independently oriented to self, situation, location and general time. Patient ID date/day of the week with use of external aid. SLP targeted problem solving goals through simple medication management task. Patient required modA to complete due to significant lethargy/nausea and subsequent poor attention. Patient left in bed with alarm set and call bell in reach. Continue POC  Pain Nausea, lethargy - NP aware  Therapy/Group: Individual Therapy  Braxton Weisbecker M.A., CCC-SLP 12/05/2024, 7:43 AM

## 2024-12-06 LAB — GLUCOSE, CAPILLARY
Glucose-Capillary: 107 mg/dL — ABNORMAL HIGH (ref 70–99)
Glucose-Capillary: 142 mg/dL — ABNORMAL HIGH (ref 70–99)
Glucose-Capillary: 104 mg/dL — ABNORMAL HIGH (ref 70–99)
Glucose-Capillary: 104 mg/dL — ABNORMAL HIGH (ref 70–99)

## 2024-12-06 NOTE — Progress Notes (Addendum)
 Recreational Therapy Session Note  Patient Details  Name: Erin [name] MRN: [id number] Date of Birth: January 09, 1956 Today's Date: 12/06/2024  Pain: no c/o Skilled Therapeutic Interventions/Progress Updates:  Goal:  Pt will require min cues for word finding during conversational leisure/discharge planning discussion.  MET  Met with pt today at bedside to discuss use of leisure time once home.  Pt's husband present as well. Pt quickly engaged in conversation with LRT/CTRS about discharge planning, feeling prepared for discharge, home modifications, pet safety.  Given extra time, pt participated in conversational speech with min cues for word finding.  Both voiced appreciation for care they are receiving on rehab.  Therapy/Group: Individual Therapy   Bridgitt Raggio 12/06/2024, 11:38 AM

## 2024-12-06 NOTE — Progress Notes (Signed)
 Occupational Therapy Session Note  Patient Details  Name: CARLING LIBERMAN MRN: [id number] Date of Birth: [birth year]  Today's Date: 12/06/2024 OT Individual Time: 1120-1200 session 2  OT Individual Time Calculation (min): 40 min   OT Individual Time: 9164-9065 session 1 OT Individual Time Calculation (min): 59 min   Short Term Goals: Week 2:  OT Short Term Goal 1 (Week 2): STG= LTG d/t ELOS  Skilled Therapeutic Interventions/Progress Updates:  Session 1: Pt greeted supine in bed, pt agreeable to OT intervention.    Pt requested to shower. Covered IV and peg tube.   Transfers/bed mobility/functional mobility:  Pt completed supine>sit with CGA. Pt completed functional ambulation in room with CGA but moments of MIN A d/t impaired sustained attention to task as pt would get distracted and get her RW stuck on an obstacle on the right side.    ADLs:  Grooming: pt completed standing oral care at sink with close CGA. Pt able to stand for duration of task. Pt also sat to complete hair care at sink with supervision.  UB dressing:pt donned [place] shirt with set- up assist.  LB dressing: pt donned pants with increased time but CGA, pt would lean laterally from EOB needing CGA and min cues for safety awareness.  Footwear: pt donned gripper socks with total A for time mgmt   Bathing: pt completed bathing from sitting with MIN A d/t impaired attention to task, pt  needing assistance to manage shower head for safety.  Transfers: ambulatory shower transfer with RW with MIN A for RW mgmt                Ended session with pt supine in bed with all needs within reach and husband present.        Session 2;Pt greeted supine in bed, pt agreeable to OT intervention.      Transfers/bed mobility/functional mobility:  Pt completed bed mobility with CGA. Pt completed functional ambulation with RW with MIN A- CGA depending on pts attention to task.  Therapeutic activity:  Pt completed various memory tasks  at BITS to improve attention to task, memory, and problem solving for safety with ADL participation.   Pt shown a sequence of 4 letters, number, words or images  for 7 seconds with pt then instructed to place stimulus in correct order.   Pt completed letter task with 88% accuracy, words with 85% accuracy, images with 64% accuracy, and numbers with 75% accuracy.   Pt did need increased time to state words correctly but was always able to problem solve words with increased time and MOD verbal cues.                 Ended session with pt supine in bed with all needs within reach and  husband present.   Therapy Documentation Precautions:  Precautions Precautions: Fall Recall of Precautions/Restrictions: Impaired Precaution/Restrictions Comments: Peg Tube; R-sided hemiparesis Restrictions Weight Bearing Restrictions Per Provider Order: No    Therapy/Group: Individual Therapy  Ronal Gift [facility] 12/06/2024, 12:25 PM

## 2024-12-06 NOTE — Progress Notes (Signed)
 Recreational Therapy Discharge Summary Patient Details  Name: Erin [name] MRN: [id number] Date of Birth: July 23, 1956 Today's Date: 12/06/2024  Comments on progress toward goals: PT is making good progress during LOS and is scheduled for discharge home with husband 12/10/24 to provide the needed supervision/assistance.  TR sessions focused on pt education in regards to leisure ed, activity analysis/modifications, domains of wellness, coping/stress management, discharge planning.  Pt is excited to return home to her cat and previously enjoyed activities.  Reasons for discharge: discharge from hospital  Follow-up: Outpatient  Patient/family agrees with progress made and goals achieved: Yes  [name] 12/06/2024, 12:18 PM

## 2024-12-06 NOTE — Progress Notes (Signed)
 Physical Therapy Session Note  Patient Details  Name: Erin [name] MRN: [id number] Date of Birth: 1956-09-09  Today's Date: 12/06/2024 PT Individual Time: 1032-1100 PT Individual Time Calculation (min): 28 min   Today's Date: 12/06/2024 PT Individual Time: 8581-8471 PT Individual Time Calculation (min): 70 min   Short Term Goals: Week 2:  PT Short Term Goal 1 (Week 2): STGs = LTGs  Skilled Therapeutic Interventions/Progress Updates:    1st Session: Pt received seated in WC and agrees to therapy. No complaint of pain. WC transport to gym. Pt performs sit to stand with minA for stability. Pt ambulates with PT providing cues interspersed throughout gait to challenge ability to adjust and balance for high level gait training. Pt initially loses balance when cued to stop, flexing forward and requiring modA for safety. With repeated trials, pt improves and able to remain standing with CGA. Pt loses balance when performing head turn to the Rt, requiring modA for balance. Pt ambulates 3x150' with seated rest breaks. Left supine in bed with all needs within reach.    2nd Session: Pt received seated at EOB and agrees to therapy. No complaint of pain. Pt performs stand step to WC with CGA. WC transport to gym. Pt performs sit to stand with cues for initiation. Pt requires minA initially for postural sway, then requires CGA for ambulation x175' with cues for trunk rotatin and arm swing to decrease risk for falls. Seated rest break.   Pt performs high level gait training, ambulating in and out of cones and stepping over 5 blocks. Pt has x1 complete LOB backward, requiring totalA to prevent fall. Pt generally seems unconcerned about balance impairments or frequency with which she loses balance. PT educates on safety and importance of maintaining balance and pt verbalizes understanding. On 2nd attempt pt also has x1 LOB laterally to the Lt, requiring totalA to prevent fall. On 3rd attempt, pt again has  x1 complete LOB when stepping over blocks, nearly falling and requiring dependentA to remain upright. Pt ambulates x175' with CGA and improved stability and safety awareness.  Pt completes Nustep for endurance and reciprocal coordination training. Pt completes 4x3:00 with rest breaks. PT provides cues for hand and foot placement and completing full available ROM.  Stand step back to [facility] with CGA. Left seated with all needs within reach.   Therapy Documentation Precautions:  Precautions Precautions: Fall Recall of Precautions/Restrictions: Impaired Precaution/Restrictions Comments: Peg Tube; R-sided hemiparesis Restrictions Weight Bearing Restrictions Per Provider Order: No   Therapy/Group: Individual Therapy  Elsie JAYSON Dawn, PT, DPT 12/06/2024, 4:14 PM

## 2024-12-06 NOTE — Progress Notes (Signed)
 Patient ID: Erin [name], female   DOB: 21-Feb-1956, 69 y.o.   MRN: [id number]  SW received updates from therapy team with d/c recs of outpatient therapy. SW will discuss with husband.   *SW spoke with husband to discuss above. Prefers [facility]- [address] location.    Graeme Jude, MSW, LCSW Office: [phone] Cell: [phone] Fax: [phone]

## 2024-12-06 NOTE — Progress Notes (Signed)
 "                                                        PROGRESS NOTE   Subjective/Complaints: No events overnight.  Vital stable.  P.o. intakes much better over the last day or so.  Patient feeling bright, alert, and ready to work with therapies today.  Still having some pain in her legs, but largely feels improved.  ROS: Patient denies fever, rash, sore throat, blurred vision, dizziness,  diarrhea, cough, shortness of breath or chest pain, joint or back/neck pain, headache, or mood change.  + Bilateral lower extremity pain + Fatigue + Nausea  Objective:   DG Abd 1 View Result Date: 12/05/2024 CLINICAL DATA:  Nausea and vomiting. EXAM: ABDOMEN - 1 VIEW COMPARISON:  11/26/2024 FINDINGS: Percutaneous gastrostomy tube with tip over the stomach in the left upper quadrant without significant change. Bowel gas pattern is nonobstructive. No evidence of free peritoneal air. Remainder of the exam is unchanged. IMPRESSION: Nonobstructive bowel gas pattern. Electronically Signed   By: Toribio Agreste M.D.   On: 12/05/2024 14:27    Recent Labs    12/05/24 0523  WBC 6.4  HGB 8.7*  HCT 26.3*  PLT 284   Recent Labs    12/05/24 0523  NA 135  K 4.1  CL 102  CO2 23  GLUCOSE 90  BUN 19  CREATININE 1.75*  CALCIUM  9.5    Intake/Output Summary (Last 24 hours) at 12/06/2024 1400 Last data filed at 12/06/2024 1204 Gross per 24 hour  Intake 992 ml  Output --  Net 992 ml        Physical Exam: Vital Signs Blood pressure 114/64, pulse 74, temperature 98 F (36.7 C), temperature source Oral, resp. rate 18, height 5' 3 (1.6 m), weight 45.1 kg, SpO2 100%.   Constitutional: No distress . Vital signs reviewed.  Laying in bed. HEENT: NCAT, EOMI, oral membranes moist Neck: supple Cardiovascular: RRR without murmur. No JVD    Respiratory/Chest: CTA Bilaterally without wheezes or rales. Normal effort    GI/Abdomen: BS +, non-tender, non-distended, PEG site c/d/I--unchanged Ext: no clubbing,  cyanosis, or edema Psych: Much more awake, alert, positive today. Skin:   - hernia repair site with steri strips, healing well, well-approximated - PEG site clean, dry, intact.  MSK:      No apparent deformity. + TTP in the bilateral knees and proximal thighs now, equal bilaterally --approved 1-16       Neurologic exam:  Cognition: AAO to person, place, and time.   Follows commands appropriately. + Mild dysarthria-no more  notable substitutions or difficulty word finding  Memory: Poor, but improving Insight: Fair insight into current condition.  Sensation: Allodynia to palpation of the bilateral proximal thighs--improved  CN: 2-12 grossly intact.   Coordination: BL UE and LE ataxia  Spasticity: MAS 0 in all extremities.   Strength: 5-/5 throughout-slightly more weak on the left extremity   Assessment/Plan: 1. Functional deficits which require 3+ hours per day of interdisciplinary therapy in a comprehensive inpatient rehab setting. Physiatrist is providing close team supervision and 24 hour management of active medical problems listed below. Physiatrist and rehab team continue to assess barriers to discharge/monitor patient progress toward functional and medical goals  Care Tool:  Bathing    Body parts bathed by patient:  Right arm, Chest, Abdomen, Front perineal area, Right upper leg, Left upper leg, Left arm, Right lower leg, Left lower leg, Face   Body parts bathed by helper: Buttocks, Left arm, Right lower leg, Left lower leg     Bathing assist Assist Level: Minimal Assistance - Patient > 75%     Upper Body Dressing/Undressing Upper body dressing   What is the patient wearing?: Pull over shirt    Upper body assist Assist Level: Set up assist    Lower Body Dressing/Undressing Lower body dressing      What is the patient wearing?: Underwear/pull up, Pants     Lower body assist Assist for lower body dressing: Contact Guard/Touching assist      Toileting Toileting    Toileting assist Assist for toileting: Moderate Assistance - Patient 50 - 74%     Transfers Chair/bed transfer  Transfers assist     Chair/bed transfer assist level: Minimal Assistance - Patient > 75%     Locomotion Ambulation   Ambulation assist      Assist level: 2 helpers Assistive device: No Device Max distance: 130'   Walk 10 feet activity   Assist     Assist level: Minimal Assistance - Patient > 75% Assistive device: No Device   Walk 50 feet activity   Assist    Assist level: 2 helpers Assistive device: No Device    Walk 150 feet activity   Assist Walk 150 feet activity did not occur: Safety/medical concerns         Walk 10 feet on uneven surface  activity   Assist     Assist level: Minimal Assistance - Patient > 75%     Wheelchair     Assist Is the patient using a wheelchair?: Yes Type of Wheelchair: Manual    Wheelchair assist level: Dependent - Patient 0% Max wheelchair distance: 150'    Wheelchair 50 feet with 2 turns activity    Assist        Assist Level: Dependent - Patient 0%   Wheelchair 150 feet activity     Assist      Assist Level: Dependent - Patient 0%   Blood pressure 114/64, pulse 74, temperature 98 F (36.7 C), temperature source Oral, resp. rate 18, height 5' 3 (1.6 m), weight 45.1 kg, SpO2 100%.   Medical Problem List and Plan: 1. 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 Min A to Mod A transfers, worse without walker. Poor safety awareness - expects someone will help her if she launches at a chair or the floor. CGA to Min A with a walker 175 ft. On regular/thins diet, Mod language with expressive >  receptive deficits. Functional memory.   2.  Antithrombotics: -DVT/anticoagulation:  Mechanical: Sequential compression devices, below knee Bilateral lower extremities Pharmaceutical: sq Heparin  5000 q8             -antiplatelet therapy: Asprin 81 mg daily    1/11 continue to hold heparin  given recent bleeding around PEG site.  H&H were stable.  SCDs ordered, teds.    -she's walking 175'+ with therapy, so sq hep  no longer needed  3. Pain Management: history of peripheral neuropathy Gabapentin  cut back to 100 mg q12 d/t encephalopathy.   PRN tylenol  and tramadol .  -1/6: Right knee pain for approximately 1 week, will get x-ray, Voltaren  gel 4 times daily, depending on findings may need injection--xray with Minimal tibial spine spur formation. Mild anterior patellar enthesophyte formation at the quadriceps tendon insertion. - 1/7: Robaxin  500 mg TID for muscle cramping in quads  1-9: Significant bilateral lower extremity sensitivity/pain.  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  - Increase gabapentin  to 200 mg twice daily; hesitant to increase further given AKI  - Resume Requip  1 mg nightly  - Resume duloxetine  20 mg daily  - Will increase Robaxin  to 750 mg 3 times daily--hesitant to switch to home tizanidine given risk of sedation but may consider this in the future   1/11  sl improvement in pain. Really can't push gabapentin  any more   -continue lidocaine  patches   -hopefully duloxetine  and requip  will help as well   1/12: Reduce gabapentin  to 100 mg 3 times daily due to AKI  1-13: Schedule tramadol  50 mg every 6 hours for pain reduction.  Reviewed CT abdomen pelvis December 24, some degenerative disks most severe at L5-S1 and L2-3, with some encroachment on the right L5-S1 neuroforamina.  Otherwise, spaces well-maintained, no obvious spinal stenosis.  1-14: Pain control  improved.  Monitor.  1-15: Some concern tramadol  may be causing more lethargy and nausea.  Reduce to 25 mg every 6 hours as needed.  1-16: Doing very well with current regimen   4. Mood/Behavior/Sleep: LCSW to follow for evaluation and support when available.              -antipsychotic agents: N/A             -Continue Ritalin  5 mg a.m. to encourage alertness     1-8: Mirtazapine  7.5 mg nightly added yesterday for sleep and appetite stimulation; patient overly lethargic today, will DC mirtazapine  to see if this improves  - DC'ed mirtazapine  due to lethargy, nausea.  Symptoms significantly improved.  1-13: Significant difficulties with motivation and fatigue ongoing. DC of Ritalin .  Will likely take a while to respond to resumption of home duloxetine  as above.  1-14: Daytime alertness, motivation did significantly respond to DC Ritalin .  Will resume.--With improvement 1-15  5. Neuropsych/cognition: This patient is capable of making decisions on her own behalf.   6. Skin/Wound Care: Routine pressure relief measures.   - Surgical sites look good   7. Fluids/Electrolytes/Nutrition/dysphagia: Monitor I&O and weight. Follow up labs CBC/CMP               Oropharyngeal dysphagia: Dysphagia: Secondary to CVA/anoxic injury.  -PEG placed on 12/30-- -Pt to receive a can of osmolite if eats less than 50% of meal -continue protein supp and free water  -consider appetite stimulant  1/6: Severe constipation contributor to nausea and poor meal intake; nutrition consult today.  Treating constipation as below.  1-7: Converting to bolus feeds if less than 50% meal completion via PEG tube.  Hopeful for p.o. improvement now that she has had a bowel movement  1-11 didn't eat much yesterday given nausea but appetite has been picking up. Hopefully will do better today  1-12: Restart IV fluids as below 1-13: P.o. intakes improving.  Supplement potassium chloride  20 mill equivalents twice daily for 2  days. 1-14: P.o. intake is very poor yesterday.  Resume Ritalin .  Start Megace  400 mg daily. 1-15: Ate only 15% of 1 meal yesterday.  Fluid flushes to 100 cc every 6 hours.  Patient knows to drink more to keep up with this reduction, see #19 below.  8. Incisional hernia repair reexploration 12/4 and 12/5 c/b hematoma:  -Gen Surgery continuing to follow as needed.    9. Acute bilateral cerebellar/cerebral infarct secondary to profound intracranial hypoperfusion due to hemorrhagic shock/hypotension:  -Continue Asprin. Follow up with [facility] outpatient.    10. Acute metabolic encephalopathy: seems much improved, continue to monitor-delirium precautions.    11. ABLA: secondary to hemorrhagic shock/critical illness. Hemoglobin 8.8 (12/31). Monitor CBC.    - 1/6: hemoglobin stable on admission labs, add daily iron supplement 325 mg to assist in RBC repletion.  Add on iron levels and TIBC to labs today--WNL  1-8: DC iron supplement due to stomach upset, nausea  1-9: Bleeding around PEG tube site today.  H&H stable 8.5.  IR evaluated, appreciate recommendations.  Patient has abdominal binder to keep PEG in place when out of bed.  1/10-11 PEG site stable this w/e  12. AKI on CKD stage IIIb: continue to monitor renal function.  -Right renal infarct: incidental finding on CT imaging on 12/4-likely due to hypoperfusion in setting of hypotension. Continue supportive care.    - Creatinine stable 1.5, BUN remains elevated 42, encourage p.o. fluids.   1-8: Creatinine 1.6 today, BUN 40.  Discussed need to eat fluid intake with patient.  Depending on I's and O's today, may give IV fluids tomorrow.  1-9: Creatinine 1.9, 500 cc fluid bolus followed by 50 cc/h normal saline for 1 day.  BMP in AM.  Discussed increasing p.o. fluids with patient, husband.  1/11 BUN/Cr 43/1.78 yesterday, sl improvement   -continue to push fluids   -resume IVF if needed. Continue flushes thru PEG   -recheck labs Monday   1-12:  BUN/creatinine remain elevated, stable.  Give 1 L IV fluid today, increase PEG tube flushes to every 4 hours 200 cc.  Repeat in AM.  1-13: BUN normalized, creatinine stable 1.6.  DC IV fluids, monitor today.  - BUN continues to downtrend, creatinine stable 1.5-1.7.  Likely representing new baseline, monitor with adjustment of fluid flushes  Repeat BMP 1-17, may be able to reduce fluid flushes through PEG  14. HCAP (Enterobacter cloacae on tracheal aspirate culture): Completed antibiotic course.    15. Hypertension: monitor BP per protocol on Amlodipine  10 mg, Coreg  12.5 mg, and Hydralazine  50 mg.    - Blood pressure stable, monitor  - 1-13: Systolic BP up, diastolic remains stable.  Increase hydralazine  to 75 mg 3 times daily  1-14: BP elevated this a.m.  Prior to medications.  Increase Coreg  to 25 mg twice daily, monitor for bradycardia.  1-15: Blood pressure remains elevated, slightly better than it was yesterday.  Monitor 1 to 2 days with recent changes    12/06/2024    1:27 PM 12/06/2024    1:17 PM 12/06/2024    5:43 AM  Vitals with BMI  Weight   99 lbs 7 oz  BMI   17.62  Systolic 114 150 849  Diastolic 64 62 62  Pulse 74  74    16. Hypothyrodism: continue Synthroid .    17. 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 Serene)             -on Aspirin   18.  Severe constipation status post multiple intra-abdominal surgeries.  - Last bowel movement 12/28  - KUB today with large stool burden--no signs of obstruction--gave sorbitol  30 mL  Large bowel movement 1-7 with sorbitol ; increase MiraLAX  to twice daily and monitor  Last bowel movement 1-12, large; a few Smear bowel movements 1-14  19.  Nausea.  Encouraged use of as needed Zofran .  On Protonix .,  Will increase to twice daily AC.  - 1/11 recurrent yesterday   -d/w nurse, making sure we pretreat with zofran  before meds   -encourage eating something before taking meds as well   -she's moving  bowels  1-15: KUB without concerning findings.  Reduce free water  flushes as above as volume has been exacerbating.  Schedule Zofran  4 mg 3 times daily with meals  1-16: Nausea much improved, p.o. intakes up.  Patient with more energy today.  LOS: 11 days A FACE TO FACE EVALUATION WAS PERFORMED  Joesph JAYSON Likes 12/06/2024, 2:00 PM     "

## 2024-12-06 NOTE — Plan of Care (Signed)
" °  Problem: Consults Goal: RH STROKE PATIENT EDUCATION Description: See Patient Education module for education specifics  Outcome: Progressing   Problem: RH BOWEL ELIMINATION Goal: RH STG MANAGE BOWEL WITH ASSISTANCE Description: STG Manage Bowel with supervision Assistance. Outcome: Progressing   Problem: RH BLADDER ELIMINATION Goal: RH STG MANAGE BLADDER WITH ASSISTANCE Description: STG Manage Bladder With supervision Assistance Outcome: Progressing   Problem: RH SKIN INTEGRITY Goal: RH STG SKIN FREE OF INFECTION/BREAKDOWN Description: Manage skin free of infection with supervision assistance Outcome: Progressing   Problem: RH SAFETY Goal: RH STG ADHERE TO SAFETY PRECAUTIONS W/ASSISTANCE/DEVICE Description: STG Adhere to Safety Precautions With Assistance/Device. Outcome: Progressing   Problem: RH PAIN MANAGEMENT Goal: RH STG PAIN MANAGED AT OR BELOW PT'S PAIN GOAL Description: <4 w/ prns Outcome: Progressing   Problem: RH KNOWLEDGE DEFICIT Goal: RH STG INCREASE KNOWLEDGE OF HYPERTENSION Description: Manage increase knowledge of hypertension with supervision assistance from husband using educational materials provided Outcome: Progressing Goal: RH STG INCREASE KNOWLEDGE OF DYSPHAGIA/FLUID INTAKE Description: Manage increase knowledge of dysphagia with supervision assistance from husband using educational materials provided Outcome: Progressing Goal: RH STG INCREASE KNOWLEDGE OF STROKE PROPHYLAXIS Description: Manage increase knowledge of stroke prophylaxis with supervision assistance from husband using educational materials provided Outcome: Progressing   "

## 2024-12-06 NOTE — Discharge Instructions (Addendum)
 Inpatient Rehab Discharge Instructions  Erin [name]  Discharge date and time:  12/10/2024  Activities/Precautions/ Functional Status:  Activity: no lifting, driving, or strenuous exercise for until cleared by your provider   Diet: regular diet  Wound Care: reinforce dressing PRN and routine PEG tube care.    Functional status:  ___ No restrictions     ___ Walk up steps independently ___ 24/7 supervision/assistance   ___ Walk up steps with assistance _X__ Intermittent supervision/assistance  ___ Bathe/dress independently ___ Walk with walker     ___ Bathe/dress with assistance ___ Walk Independently    ___ Shower independently ___ Walk with assistance    ___ Shower with assistance __X_ No alcohol     ___ Return to work/school ________  Special Instructions:  PEG tube maintenance: Flush with 50 mls twice daily.   My questions have been answered and I understand these instructions. I will adhere to these goals and the provided educational materials after my discharge from the hospital.  Patient/Caregiver Signature _______________________________ Date __________  Clinician Signature _______________________________________ Date __________  Please bring this form and your medication list with you to all your follow-up doctor's appointments.   COMMUNITY REFERRALS UPON DISCHARGE:     Outpatient: PT      OT     ST              Agency:[facility]- [address] location [phone]              Appointment Date/Time:*Please expect follow-up within 7-10 business days to schedule your appointment. If you have not received follow-up, be sure to contact the site directly.*      STROKE/TIA DISCHARGE INSTRUCTIONS SMOKING Cigarette smoking nearly doubles your risk of having a stroke & is the single most alterable risk factor  If you smoke or have smoked in the last 12 months, you are advised to quit smoking for your health. Most of the excess cardiovascular risk related to  smoking disappears within a year of stopping. Ask you doctor about anti-smoking medications [place] Quit Line: 1-800-QUIT NOW Free Smoking Cessation Classes [phone]  CHOLESTEROL Know your levels; limit fat & cholesterol in your diet  Lipid Panel     Component Value Date/Time   CHOL 91 10/29/2024 0530   CHOL 147 05/23/2022 1438   TRIG 331 (H) 10/29/2024 0530   HDL 12 (L) 10/29/2024 0530   HDL 37 (L) 05/23/2022 1438   CHOLHDL 7.6 10/29/2024 0530   VLDL 66 (H) 10/29/2024 0530   LDLCALC 13 10/29/2024 0530   LDLCALC 71 05/23/2022 1438   LDLCALC 78 12/10/2021 1532     Many patients benefit from treatment even if their cholesterol is at goal. Goal: Total Cholesterol (CHOL) less than 160 Goal:  Triglycerides (TRIG) less than 150 Goal:  HDL greater than 40 Goal:  LDL (LDLCALC) less than 100   BLOOD PRESSURE American Stroke Association blood pressure target is less that 120/80 mm/Hg  Your discharge blood pressure is:  BP: (!) 132/59 Monitor your blood pressure Limit your salt and alcohol intake Many individuals will require more than one medication for high blood pressure  DIABETES (A1c is a blood sugar average for last 3 months) Goal HGBA1c is under 7% (HBGA1c is blood sugar average for last 3 months)  Diabetes: No known diagnosis of diabetes    Lab Results  Component Value Date   HGBA1C 4.9 10/18/2024    Your HGBA1c can be lowered with medications, healthy diet, and exercise. Check  your blood sugar as directed by your physician Call your physician if you experience unexplained or low blood sugars.  PHYSICAL ACTIVITY/REHABILITATION Goal is 30 minutes at least 4 days per week  Activity: Increase activity slowly,, No driving,, and Walk with assistance, Therapies: Physical Therapy: Outpatient, Occupational Therapy: Outpatient, and Speech Therapy: Outpatient Return to work: N/A  Activity decreases your risk of heart attack and stroke and makes your heart stronger.  It helps control  your weight and blood pressure; helps you relax and can improve your mood. Participate in a regular exercise program. Talk with your doctor about the best form of exercise for you (dancing, walking, swimming, cycling).  DIET/WEIGHT Goal is to maintain a healthy weight  Your discharge diet is:  Diet Order             Diet regular Room service appropriate? Yes with Assist; Fluid consistency: Thin  Diet effective now                  Your height is:  Height: 5' 3 (160 cm) Your current weight is: Weight: 46.9 kg Your Body Mass Index (BMI) is:  BMI (Calculated): 18.32 Following the type of diet specifically designed for you will help prevent another stroke. Your goal Body Mass Index (BMI) is 19-24. Healthy food habits can help reduce 3 risk factors for stroke:  High cholesterol, hypertension, and excess weight.  RESOURCES Stroke/Support Group:  Call [phone]   STROKE EDUCATION PROVIDED/REVIEWED AND GIVEN TO PATIENT Stroke warning signs and symptoms How to activate emergency medical system (call 911). Medications prescribed at discharge. Need for follow-up after discharge. Personal risk factors for stroke. Pneumonia vaccine given: No Flu vaccine given: No My questions have been answered, the writing is legible, and I understand these instructions.  I will adhere to these goals & educational materials that have been provided to me after my discharge from the hospital.        High-Calorie, High-Protein Nutrition Therapy (2021) A high-calorie, high-protein diet has been recommended to you. Your registered dietitian nutritionist (RDN) may have recommended this diet because you are having difficulty eating enough calories throughout the day, you have lost weight, and/or you need to add protein to your diet. Sometimes you may not feel like eating, even if you know the importance of good nutrition. The recommendations in this handout can help you with the following: Regaining your  strength and energy Keeping your body healthy Healing and recovering from surgery or illness and fighting infection Tips: Schedule Your Meals and Snacks Several small meals and snacks are often better tolerated and digested than large meals. Strategies Plan to eat 3 meals and 3 snacks daily. Experiment with timing meals to find out when you have a larger appetite. Appetite may be greatest in the morning after not eating all night so you may prefer to eat your larger meals and snacks in the morning and at lunch. Breakfast-type foods are often better tolerated so eat foods such as eggs, pancakes, waffles and cereal for any meal or snack. Carry snacks with you so you are prepared to eat every 2 to 3 hours. Determine what works best for you if your bodys cues for feeling hungry or full are not working. Eat a small meal or snack even if you dont feel hungry. Set a timer to remind you when it is time to eat. Take a walk before you eat (with health care providers approval). Light or moderate physical activity can help you maintain muscle  and increase your appetite. Make Eating Enjoyable Taking steps to make the experience enjoyable may help to increase your interest in eating and improve your appetite. Strategies: Eat with others whenever possible. Include your favorite foods to make meals more enjoyable. Try new foods. Save your beverage for the end of the meal so that you have more room for food before you get full. Add Calories to Your Meals and Snacks Try adding calorie-dense foods so that each bite provides more nutrition. Strategies Drink milk, chocolate milk, soy milk, or smoothies instead of low-calorie beverages such as diet drinks or water . [place] with milk or soy milk instead of water  when making dishes such as hot cereal, cocoa, or pudding. Add jelly, jam, honey, butter or margarine to bread and crackers. Add jam or fruit to ice cream and as a topping over cake. Mix dried fruit,  nuts, granola, honey, or dry cereal with yogurt or hot cereals. Enjoy snacks such as milkshakes, smoothies, pudding, ice cream, or custard. Blend a fruit smoothie of a banana, frozen berries, milk or soy milk, and 1 tablespoon nonfat powdered milk or protein powder. Add Protein to Your Meals and Snacks Choose at least one protein food at each meal and snack to increase your daily intake. Strategies Add  cup nonfat dry milk powder or protein powder to make a high-protein milk to drink or to use in recipes that call for milk. Vanilla or peppermint extract or unsweetened cocoa powder could help to boost the flavor. Add hard-cooked eggs, leftover meat, grated cheese, canned beans or tofu to noodles, rice, salads, sandwiches, soups, casseroles, pasta, tuna and other mixed dishes. Add powdered milk or protein powder to hot cereals, meatloaf, casseroles, scrambled eggs, sauces, cream soups, and shakes. Add beans and lentils to salads, soups, casseroles, and vegetable dishes. Eat cottage cheese or yogurt, especially Greek yogurt, with fruit as a snack or dessert. Eat peanut or other nut butters on crackers, bread, toast, waffles, apples, bananas or celery sticks. Add it to milkshakes, smoothies, or desserts. Consider a ready-made protein shake. Your RDN will make recommendations. Add Fats to Your Meals and Snacks Try adding fats to your meals and snacks. Fat provides more calories in fewer bites than carbohydrate or protein and adds flavors to your foods. Strategies Snack on nuts and seeds or add them to foods like salads, pasta, cereals, yogurt, and ice cream.  Saut or stir-fry vegetables, meats, chicken, fish or tofu in olive or canola oil.  Add olive oil, other vegetable oils, butter or margarine to soups, vegetables, potatoes, cooked cereal, rice, pasta, bread, crackers, pancakes, or waffles. Snack on olives or add to pasta, pizza, or salad. Add avocado or guacamole to your salads, sandwiches, and  other entrees. Include fatty fish such as salmon in your weekly meal plan. For general food safety tips, especially for clients with immunocompromised conditions, ask your RDN for the Food Safety Nutrition Therapy handout. Small Meal and Snack Ideas These snacks and meals are recommended when you have to eat but arent necessarily hungry.  They are good choices because they are high in protein and high in calories.  2 graham crackers 2 tablespoons peanut or other nut butter 1 cup milk 2 slices whole wheat toast topped with:  avocado, mashed Seasoning of your choice   cup Greek yogurt  cup fruit  cup granola 2 deviled egg halves 5 whole wheat crackers  1 cup cream of tomato soup  grilled cheese sandwich 1 toasted waffle topped with: 2  tablespoons peanut or nut butter 1 tablespoon jam  Trail mix made with:  cup nuts  cup dried fruit  cup cold cereal, any variety  cup oatmeal or cream of wheat cereal 1 tablespoon peanut or nut butter  cup diced fruit   High-Calorie, High-Protein Sample 1-Day Menu View Nutrient Info Breakfast 1 egg, scrambled 1 ounce cheddar cheese 1 English muffin, whole wheat 1 tablespoon margarine 1 tablespoon jam  cup orange juice, fortified with calcium  and vitamin D   Morning Snack 1 tablespoon peanut butter 1 banana 1 cup 1% milk  Lunch Tuna salad sandwich made with: 2 slices bread, whole wheat 3 ounces tuna mixed with: 1 tablespoon mayonnaise  cup pudding  Afternoon Snack  cup hummus  cup carrots 1 pita  Evening Meal Enchilada casserole made with: 2 corn tortillas 3 ounces ground beef, cooked  cup black beans, cooked  cup corn, cooked 1 ounce grated cheddar cheese  cup enchilada sauce  avocado, sliced, topping for enchilada 1 tablespoon sour cream, topping for enchilada Salad:  cup lettuce, shredded  cup tomatoes, chopped, for salad 1 tablespoon olive oil and vinegar dressing, for salad  Evening Snack  cup Greek yogurt   cup blueberries  cup granola

## 2024-12-07 LAB — BASIC METABOLIC PANEL WITH GFR
Anion gap: 12 (ref 5–15)
BUN: 17 mg/dL (ref 8–23)
CO2: 21 mmol/L — ABNORMAL LOW (ref 22–32)
Calcium: 9.1 mg/dL (ref 8.9–10.3)
Chloride: 101 mmol/L (ref 98–111)
Creatinine, Ser: 1.65 mg/dL — ABNORMAL HIGH (ref 0.44–1.00)
GFR, Estimated: 33 mL/min — ABNORMAL LOW
Glucose, Bld: 97 mg/dL (ref 70–99)
Potassium: 3.3 mmol/L — ABNORMAL LOW (ref 3.5–5.1)
Sodium: 133 mmol/L — ABNORMAL LOW (ref 135–145)

## 2024-12-07 LAB — GLUCOSE, CAPILLARY
Glucose-Capillary: 97 mg/dL (ref 70–99)
Glucose-Capillary: 110 mg/dL — ABNORMAL HIGH (ref 70–99)
Glucose-Capillary: 102 mg/dL — ABNORMAL HIGH (ref 70–99)

## 2024-12-07 MED ORDER — POTASSIUM CHLORIDE CRYS ER 20 MEQ PO TBCR
40.0000 meq | EXTENDED_RELEASE_TABLET | Freq: Every day | ORAL | Status: DC
  Administered 2024-12-07 – 2024-12-10 (×4): 40 meq via ORAL
  Filled 2024-12-07 (×5): qty 2

## 2024-12-07 NOTE — Plan of Care (Signed)
" °  Problem: Consults Goal: RH STROKE PATIENT EDUCATION Description: See Patient Education module for education specifics  Outcome: Progressing   Problem: RH BOWEL ELIMINATION Goal: RH STG MANAGE BOWEL WITH ASSISTANCE Description: STG Manage Bowel with supervision Assistance. Outcome: Progressing   Problem: RH BLADDER ELIMINATION Goal: RH STG MANAGE BLADDER WITH ASSISTANCE Description: STG Manage Bladder With supervision Assistance Outcome: Progressing   Problem: RH SKIN INTEGRITY Goal: RH STG SKIN FREE OF INFECTION/BREAKDOWN Description: Manage skin free of infection with supervision assistance Outcome: Progressing   Problem: RH SAFETY Goal: RH STG ADHERE TO SAFETY PRECAUTIONS W/ASSISTANCE/DEVICE Description: STG Adhere to Safety Precautions With Assistance/Device. Outcome: Progressing   Problem: RH PAIN MANAGEMENT Goal: RH STG PAIN MANAGED AT OR BELOW PT'S PAIN GOAL Description: <4 w/ prns Outcome: Progressing   Problem: RH KNOWLEDGE DEFICIT Goal: RH STG INCREASE KNOWLEDGE OF HYPERTENSION Description: Manage increase knowledge of hypertension with supervision assistance from husband using educational materials provided Outcome: Progressing Goal: RH STG INCREASE KNOWLEDGE OF DYSPHAGIA/FLUID INTAKE Description: Manage increase knowledge of dysphagia with supervision assistance from husband using educational materials provided Outcome: Progressing Goal: RH STG INCREASE KNOWLEDGE OF STROKE PROPHYLAXIS Description: Manage increase knowledge of stroke prophylaxis with supervision assistance from husband using educational materials provided Outcome: Progressing   "

## 2024-12-07 NOTE — Progress Notes (Signed)
 "                                                        PROGRESS NOTE   Subjective/Complaints:  No acute complaints.  No events overnight. Continues to be eating better, 80 to 90% of meals. Last bowel movement 1-14 A.m. BMP with stable BUN/creatinine.  Sodium slightly down.  ROS: Patient denies fever, rash, sore throat, blurred vision, dizziness,  diarrhea, cough, shortness of breath or chest pain, joint or back/neck pain, headache, or mood change.   Objective:   No results found.   Recent Labs    12/05/24 0523  WBC 6.4  HGB 8.7*  HCT 26.3*  PLT 284   Recent Labs    12/05/24 0523 12/07/24 0646  NA 135 133*  K 4.1 3.3*  CL 102 101  CO2 23 21*  GLUCOSE 90 97  BUN 19 17  CREATININE 1.75* 1.65*  CALCIUM  9.5 9.1    Intake/Output Summary (Last 24 hours) at 12/07/2024 1409 Last data filed at 12/07/2024 1241 Gross per 24 hour  Intake 1108 ml  Output --  Net 1108 ml        Physical Exam: Vital Signs Blood pressure (!) 134/52, pulse 81, temperature 98 F (36.7 C), resp. rate 17, height 5' 3 (1.6 m), weight 45.1 kg, SpO2 97%.   Constitutional: No distress . Vital signs reviewed.  Laying in bed. HEENT: NCAT, EOMI, oral membranes moist Neck: supple Cardiovascular: RRR without murmur. No JVD    Respiratory/Chest: CTA Bilaterally without wheezes or rales. Normal effort    GI/Abdomen: BS +, non-tender, non-distended, PEG site c/d/I Ext: no clubbing, cyanosis, or edema Psych: Much more awake, alert, positive today. Skin:   - hernia repair site with steri strips, healing well, well-approximated - PEG site clean, dry, intact.  MSK:      No apparent deformity. + TTP in the bilateral knees and proximal thighs now, equal bilaterally --approved 1-16       Neurologic exam:  Cognition: AAO to person, place, and time.   Follows commands appropriately. + Mild dysarthria-no more  notable substitutions or difficulty word finding  Memory: Poor, but improving Insight: Fair  insight into current condition.  Sensation: Allodynia to palpation of the bilateral proximal thighs--improved  CN: 2-12 grossly intact.   Coordination: BL UE and LE ataxia  Spasticity: MAS 0 in all extremities.   Strength: 5-/5 throughout-slightly more weak on the left extremity  Physical exam unchanged from the above on reexamination 12/07/24    Assessment/Plan: 1. Functional deficits which require 3+ hours per day of interdisciplinary therapy in a comprehensive inpatient rehab setting. Physiatrist is providing close team supervision and 24 hour management of active medical problems listed below. Physiatrist and rehab team continue to assess barriers to discharge/monitor patient progress toward functional and medical goals  Care Tool:  Bathing    Body parts bathed by patient: Right arm, Chest, Abdomen, Front perineal area, Right upper leg, Left upper leg, Left arm, Right lower leg, Left lower leg, Face   Body parts bathed by helper: Buttocks, Left arm, Right lower leg, Left lower leg     Bathing assist Assist Level: Minimal Assistance - Patient > 75%     Upper Body Dressing/Undressing Upper body dressing   What is the patient wearing?: Pull over shirt  Upper body assist Assist Level: Set up assist    Lower Body Dressing/Undressing Lower body dressing      What is the patient wearing?: Underwear/pull up, Pants     Lower body assist Assist for lower body dressing: Contact Guard/Touching assist     Toileting Toileting    Toileting assist Assist for toileting: Moderate Assistance - Patient 50 - 74%     Transfers Chair/bed transfer  Transfers assist     Chair/bed transfer assist level: Minimal Assistance - Patient > 75%     Locomotion Ambulation   Ambulation assist      Assist level: 2 helpers Assistive device: No Device Max distance: 130'   Walk 10 feet activity   Assist     Assist level: Minimal Assistance - Patient > 75% Assistive device:  No Device   Walk 50 feet activity   Assist    Assist level: 2 helpers Assistive device: No Device    Walk 150 feet activity   Assist Walk 150 feet activity did not occur: Safety/medical concerns         Walk 10 feet on uneven surface  activity   Assist     Assist level: Minimal Assistance - Patient > 75%     Wheelchair     Assist Is the patient using a wheelchair?: Yes Type of Wheelchair: Manual    Wheelchair assist level: Dependent - Patient 0% Max wheelchair distance: 150'    Wheelchair 50 feet with 2 turns activity    Assist        Assist Level: Dependent - Patient 0%   Wheelchair 150 feet activity     Assist      Assist Level: Dependent - Patient 0%   Blood pressure (!) 134/52, pulse 81, temperature 98 F (36.7 C), resp. rate 17, height 5' 3 (1.6 m), weight 45.1 kg, SpO2 97%.   Medical Problem List and Plan: 1. 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 Min A to Mod A transfers, worse without walker. Poor safety awareness - expects someone will help her if she launches at a chair or the floor. CGA to Min A with a walker 175 ft. On regular/thins diet, Mod language with expressive > receptive deficits. Functional memory.   2.  Antithrombotics: -DVT/anticoagulation:  Mechanical: Sequential compression devices, below knee Bilateral lower extremities Pharmaceutical: sq Heparin  5000 q8             -antiplatelet therapy: Asprin 81 mg daily    1/11 continue to hold heparin  given recent bleeding around PEG site.  H&H were stable.  SCDs ordered, teds.    -she's walking 175'+ with therapy, so sq hep no longer needed  3. Pain Management: history of peripheral neuropathy Gabapentin   cut back to 100 mg q12 d/t encephalopathy.   PRN tylenol  and tramadol .  -1/6: Right knee pain for approximately 1 week, will get x-ray, Voltaren  gel 4 times daily, depending on findings may need injection--xray with Minimal tibial spine spur formation. Mild anterior patellar enthesophyte formation at the quadriceps tendon insertion. - 1/7: Robaxin  500 mg TID for muscle cramping in quads  1-9: Significant bilateral lower extremity sensitivity/pain.  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  - Increase gabapentin  to 200 mg twice daily; hesitant to increase further given AKI  - Resume Requip  1 mg nightly  - Resume duloxetine  20 mg daily  - Will increase Robaxin  to 750 mg 3 times daily--hesitant to switch to home tizanidine given risk of sedation but may consider this in the future   1/11  sl improvement in pain. Really can't push gabapentin  any more   -continue lidocaine  patches   -hopefully duloxetine  and requip  will help as well   1/12: Reduce gabapentin  to 100 mg 3 times daily due to AKI  1-13: Schedule tramadol  50 mg every 6 hours for pain reduction.  Reviewed CT abdomen pelvis December 24, some degenerative disks most severe at L5-S1 and L2-3, with some encroachment on the right L5-S1 neuroforamina.  Otherwise, spaces well-maintained, no obvious spinal stenosis.  1-14: Pain control improved.  Monitor.  1-15: Some concern tramadol  may be causing more lethargy and nausea.  Reduce to 25 mg every 6 hours as needed.  1-16: Doing very well with current regimen   4. Mood/Behavior/Sleep: LCSW to follow for evaluation and support when available.              -antipsychotic agents: N/A             -Continue Ritalin  5 mg a.m. to encourage alertness     1-8: Mirtazapine  7.5 mg nightly added yesterday for sleep and appetite stimulation; patient overly lethargic today, will DC  mirtazapine  to see if this improves  - DC'ed mirtazapine  due to lethargy, nausea.  Symptoms significantly improved.  1-13: Significant difficulties with motivation and fatigue ongoing. DC of Ritalin .  Will likely take a while to respond to resumption of home duloxetine  as above.  1-14: Daytime alertness, motivation did significantly respond to DC Ritalin .  Will resume.--With improvement 1-15   5. Neuropsych/cognition: This patient is capable of making decisions on her own behalf.   6. Skin/Wound Care: Routine pressure relief measures.   - Surgical sites look good   7. Fluids/Electrolytes/Nutrition/dysphagia: Monitor I&O and weight. Follow up labs CBC/CMP               Oropharyngeal dysphagia: Dysphagia: Secondary to CVA/anoxic injury.  -PEG placed on 12/30-- -Pt to receive a can of osmolite if eats less than 50% of meal -continue protein supp and free water  -consider appetite stimulant  1/6: Severe constipation contributor to nausea and poor meal intake; nutrition consult today.  Treating constipation as below.  1-7: Converting to bolus feeds if less than 50% meal completion via PEG tube.  Hopeful for p.o. improvement now that she has had a bowel movement  1-11 didn't eat much yesterday given nausea but appetite has been picking up. Hopefully will do better today  1-12: Restart IV fluids as below 1-13: P.o. intakes improving.  Supplement potassium chloride  20 mill equivalents twice daily for 2 days. 1-14: P.o. intake is very poor yesterday.  Resume Ritalin .  Start Megace  400 mg daily. 1-15: Ate only 15% of 1 meal yesterday.  Fluid flushes to 100 cc every 6 hours.  Patient knows to drink more to keep up with this reduction, see #19 below. 1-17: P.o. intakes much improved over the last 2 days, nearly 100%.  BMP with mild hypokalemia, will start standing 40 mill equivalent kdur, stop free water   flushes, encourage p.o. fluids and repeat Monday a.m.  8. Incisional hernia repair reexploration  12/4 and 12/5 c/b hematoma:  -Gen Surgery continuing to follow as needed.    9. Acute bilateral cerebellar/cerebral infarct secondary to profound intracranial hypoperfusion due to hemorrhagic shock/hypotension:  -Continue Asprin. Follow up with [facility] outpatient.    10. Acute metabolic encephalopathy: seems much improved, continue to monitor-delirium precautions.    11. ABLA: secondary to hemorrhagic shock/critical illness. Hemoglobin 8.8 (12/31). Monitor CBC.    - 1/6: hemoglobin stable on admission labs, add daily iron supplement 325 mg to assist in RBC repletion.  Add on iron levels and TIBC to labs today--WNL  1-8: DC iron supplement due to stomach upset, nausea  1-9: Bleeding around PEG tube site today.  H&H stable 8.5.  IR evaluated, appreciate recommendations.  Patient has abdominal binder to keep PEG in place when out of bed.  1/10-11 PEG site stable this w/e  12. AKI on CKD stage IIIb: continue to monitor renal function.  -Right renal infarct: incidental finding on CT imaging on 12/4-likely due to hypoperfusion in setting of hypotension. Continue supportive care.    - Creatinine stable 1.5, BUN remains elevated 42, encourage p.o. fluids.   1-8: Creatinine 1.6 today, BUN 40.  Discussed need to eat fluid intake with patient.  Depending on I's and O's today, may give IV fluids tomorrow.  1-9: Creatinine 1.9, 500 cc fluid bolus followed by 50 cc/h normal saline for 1 day.  BMP in AM.  Discussed increasing p.o. fluids with patient, husband.  1/11 BUN/Cr 43/1.78 yesterday, sl improvement   -continue to push fluids   -resume IVF if needed. Continue flushes thru PEG   -recheck labs Monday   1-12: BUN/creatinine remain elevated, stable.  Give 1 L IV fluid today, increase PEG tube flushes to every 4 hours 200 cc.  Repeat in AM.  1-13: BUN normalized, creatinine stable 1.6.  DC IV fluids, monitor today.  - BUN continues to downtrend, creatinine stable 1.5-1.7.  Likely representing new baseline,  monitor with adjustment of fluid flushes  Repeat BMP 1-17, may be able to reduce fluid flushes through PEG--urine/creatinine looks good, will DC free water  flushes.  14. HCAP (Enterobacter cloacae on tracheal aspirate culture): Completed antibiotic course.    15. Hypertension: monitor BP per protocol on Amlodipine  10 mg, Coreg  12.5 mg, and Hydralazine  50 mg.    - Blood pressure stable, monitor  - 1-13: Systolic BP up, diastolic remains stable.  Increase hydralazine  to 75 mg 3 times daily  1-14: BP elevated this a.m.  Prior to medications.  Increase Coreg  to 25 mg twice daily, monitor for bradycardia.  1-15: Blood pressure remains elevated, slightly better than it was yesterday.  Monitor 1 to 2 days with recent changes  Normotensive 1-17    12/07/2024    1:22 PM 12/07/2024    5:29 AM 12/06/2024    8:32 PM  Vitals with BMI  Systolic 134 126 886  Diastolic 52 95 54  Pulse 81 95 83    16. Hypothyrodism: continue Synthroid .    17. 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 Serene)             -on Aspirin     18.  Severe constipation status post multiple intra-abdominal surgeries.  - Last bowel movement 12/28  - KUB today with large stool burden--no signs of obstruction--gave sorbitol  30 mL  Large bowel movement 1-7 with sorbitol ; increase MiraLAX  to twice  daily and monitor  Last bowel movement 1-12, large; a few Smear bowel movements 1-14  1-17: P.o. intakes picking up, if no bowel movement the next 2 days will give extra regimen.  19.  Nausea.  Encouraged use of as needed Zofran .  On Protonix .,  Will increase to twice daily AC.  - 1/11 recurrent yesterday   -d/w nurse, making sure we pretreat with zofran  before meds   -encourage eating something before taking meds as well   -she's moving bowels  1-15: KUB without concerning findings.  Reduce free water  flushes as above as volume has been exacerbating.  Schedule Zofran  4 mg 3 times daily with  meals  1-16: Nausea much improved, p.o. intakes up.  Patient with more energy today.--Continued improvement 1-17  LOS: 12 days A FACE TO FACE EVALUATION WAS PERFORMED  Joesph JAYSON Likes 12/07/2024, 2:09 PM     "

## 2024-12-07 NOTE — Progress Notes (Signed)
 Speech Language Pathology Daily Session Note  Patient Details  Name: Erin [name] MRN: [id number] Date of Birth: [birth year]  Today's Date: 12/07/2024 SLP Individual Time: 1015-1100 SLP Individual Time Calculation (min): 45 min  Short Term Goals: Week 2: SLP Short Term Goal 1 (Week 2): STGs = LTGs d/t ELOS  Skilled Therapeutic Interventions: Skilled intervention focused on cognition and verbal expression.  Pt named objects when given description with min A. She increased accuracy once more comfortable with task. She answered complex yes/no questions with min assistance and occasionally needed attention to details. She answered orientation questions with min A verbal cues when shown monthly calendar. She continues to require phonemic cues to increase accuracy with naming and demonstrates increased awareness into phonemic paraphasias. Cont therapy per plan of care.   Pain Pain Assessment Pain Scale: Faces Pain Score: 0-No pain Faces Pain Scale: No hurt  Therapy/Group: Individual Therapy  Recardo A Kerby Borner 12/07/2024, 11:06 AM

## 2024-12-08 LAB — GLUCOSE, CAPILLARY
Glucose-Capillary: 140 mg/dL — ABNORMAL HIGH (ref 70–99)
Glucose-Capillary: 108 mg/dL — ABNORMAL HIGH (ref 70–99)
Glucose-Capillary: 119 mg/dL — ABNORMAL HIGH (ref 70–99)
Glucose-Capillary: 70 mg/dL (ref 70–99)
Glucose-Capillary: 109 mg/dL — ABNORMAL HIGH (ref 70–99)

## 2024-12-08 MED ORDER — POLYETHYLENE GLYCOL 3350 17 G PO PACK
17.0000 g | PACK | Freq: Every day | ORAL | Status: DC
  Administered 2024-12-08 – 2024-12-09 (×2): 17 g via ORAL
  Filled 2024-12-08 (×3): qty 1

## 2024-12-08 MED ORDER — MIRABEGRON ER 25 MG PO TB24
25.0000 mg | ORAL_TABLET | Freq: Every day | ORAL | Status: DC
  Administered 2024-12-08 – 2024-12-09 (×2): 25 mg via ORAL
  Filled 2024-12-08 (×2): qty 1

## 2024-12-08 NOTE — Progress Notes (Signed)
 Physical Therapy Session Note  Patient Details  Name: RANDIE BLOODGOOD MRN: [id number] Date of Birth: [birth year]  Today's Date: 12/08/2024 PT Individual Time: 1015-1100 PT Individual Time Calculation (min): 45 min   Short Term Goals: Week 2:  PT Short Term Goal 1 (Week 2): STGs = LTGs  Skilled Therapeutic Interventions/Progress Updates: Pt presented in bed agreeable to therapy. Pt denies pain at rest. Performed bed mobility with supervision from flat bed. Pt requesting to brush teeth. Stood with CGA and noted mild retropulsion but pt aware and regained balance before ambulating to sink without AD and CGA. Performed oral hygiene in standing with CGA and intermittent multimodal cues for balance correction due to increased posterior lean. Once completed seated rest in w/c and transported to main gym for energy conservation. Performed ambulatory transfer to mat with CGA and participated in gait ~110ft with CGA without AD. Pt noted to have decreased nearing absent arm swing. PTA then obtained 1lb barbells and had pt ambulate again this time holding barbells with improvement in arm swing. Participated in balance activity with throwing basketball on rebounder forward and with rotation. Pt able to catch with minimal sway and no LOB however would become more forward flexed with repetition requiring mod multimodal cues for correction. Pt then participated in [employer] LOS on static board with with achieving 36% and 35% respectively. Pt requiring cues for avoiding leaning and work on weight shifting. Pt also played catch application with avg 2.28 and .36 reaction time. Pt transported back to room at end of session and completed stand step transfer to bed with CGA and returned to supine with supervision. Pt left in bed at end of session with bed alarm on, call bell within reach and needs met.      Therapy Documentation Precautions:  Precautions Precautions: Fall Recall of Precautions/Restrictions:  Impaired Precaution/Restrictions Comments: Peg Tube; R-sided hemiparesis Restrictions Weight Bearing Restrictions Per Provider Order: No   Therapy/Group: Individual Therapy  Ernesto Zukowski 12/08/2024, 12:57 PM

## 2024-12-08 NOTE — Progress Notes (Signed)
 "                                                        PROGRESS NOTE   Subjective/Complaints: No acute complaints.  No events overnight.  Does note chronic issues with urinary urgency, especially in the evenings.  Denies any dysuria; has some intermittent incontinence.  Asking if this can be treated while she is in the hospital, especially since her fluid intake is increased.  Otherwise, feeling awake, alert.  No more nausea, eating well.  Pain in lower extremities remains but is overall better controlled.  She is very happy with how well she walked in for PT today.  ROS: Patient denies fever, rash, sore throat, blurred vision, dizziness,  diarrhea, cough, shortness of breath or chest pain, joint or back/neck pain, headache, or mood change.  Bilateral lower extremity neuropathy  Objective:   No results found.   No results for input(s): WBC, HGB, HCT, PLT in the last 72 hours.  Recent Labs    12/07/24 0646  NA 133*  K 3.3*  CL 101  CO2 21*  GLUCOSE 97  BUN 17  CREATININE 1.65*  CALCIUM  9.1    Intake/Output Summary (Last 24 hours) at 12/08/2024 2015 Last data filed at 12/08/2024 1900 Gross per 24 hour  Intake 830 ml  Output --  Net 830 ml        Physical Exam: Vital Signs Blood pressure (!) 127/57, pulse 77, temperature 98.2 F (36.8 C), temperature source Oral, resp. rate 18, height 5' 3 (1.6 m), weight 46.9 kg, SpO2 98%.   Constitutional: No distress . Vital signs reviewed.  Laying in bed. HEENT: NCAT, EOMI, oral membranes moist Cardiovascular: RRR without murmur. No JVD    Respiratory/Chest: CTA Bilaterally without wheezes or rales. Normal effort    GI/Abdomen: BS +, non-tender, non-distended  Ext: no clubbing, cyanosis, or edema Psych: Much more awake, alert, positive.  Appropriate mood and affect. Skin:   - hernia repair site closed, Steri-Strips off.  Healing well. - PEG site clean, dry, intact.  MSK:      No apparent deformity. + TTP in the  bilateral knees and proximal thighs now, equal bilaterally --approved 1-16       Neurologic exam:  Cognition: AAO to person, place, and time.   Follows commands appropriately. + Mild dysarthria-occasional difficulty word finding/substitutions but overall improved  Memory: Poor, but improving Insight: Fair insight into current condition.  Sensation: Allodynia to palpation of the bilateral proximal thighs--improved  CN: 2-12 grossly intact.   Coordination: BL UE and LE ataxia  Spasticity: MAS 0 in all extremities.   Strength: 5-/5 throughout-slightly more weak on the left extremity   Assessment/Plan: 1. Functional deficits which require 3+ hours per day of interdisciplinary therapy in a comprehensive inpatient rehab setting. Physiatrist is providing close team supervision and 24 hour management of active medical problems listed below. Physiatrist and rehab team continue to assess barriers to discharge/monitor patient progress toward functional and medical goals  Care Tool:  Bathing    Body parts bathed by patient: Right arm, Chest, Abdomen, Front perineal area, Right upper leg, Left upper leg, Left arm, Right lower leg, Left lower leg, Face   Body parts bathed by helper: Buttocks, Left arm, Right lower leg, Left lower leg     Bathing  assist Assist Level: Minimal Assistance - Patient > 75%     Upper Body Dressing/Undressing Upper body dressing   What is the patient wearing?: Pull over shirt    Upper body assist Assist Level: Set up assist    Lower Body Dressing/Undressing Lower body dressing      What is the patient wearing?: Underwear/pull up, Pants     Lower body assist Assist for lower body dressing: Contact Guard/Touching assist     Toileting Toileting    Toileting assist Assist for toileting: Moderate Assistance - Patient 50 - 74%     Transfers Chair/bed transfer  Transfers assist     Chair/bed transfer assist level: Minimal Assistance - Patient > 75%      Locomotion Ambulation   Ambulation assist      Assist level: 2 helpers Assistive device: No Device Max distance: 130'   Walk 10 feet activity   Assist     Assist level: Minimal Assistance - Patient > 75% Assistive device: No Device   Walk 50 feet activity   Assist    Assist level: 2 helpers Assistive device: No Device    Walk 150 feet activity   Assist Walk 150 feet activity did not occur: Safety/medical concerns         Walk 10 feet on uneven surface  activity   Assist     Assist level: Minimal Assistance - Patient > 75%     Wheelchair     Assist Is the patient using a wheelchair?: Yes Type of Wheelchair: Manual    Wheelchair assist level: Dependent - Patient 0% Max wheelchair distance: 150'    Wheelchair 50 feet with 2 turns activity    Assist        Assist Level: Dependent - Patient 0%   Wheelchair 150 feet activity     Assist      Assist Level: Dependent - Patient 0%   Blood pressure (!) 127/57, pulse 77, temperature 98.2 F (36.8 C), temperature source Oral, resp. rate 18, height 5' 3 (1.6 m), weight 46.9 kg, SpO2 98%.   Medical Problem List and Plan: 1. 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 Min A to Mod A transfers, worse without walker. Poor safety awareness - expects someone will help her if she launches at a chair or the floor. CGA to Min A with a walker 175 ft. On regular/thins diet, Mod language with expressive > receptive deficits. Functional memory.   2.  Antithrombotics: -DVT/anticoagulation:  Mechanical: Sequential compression devices, below knee Bilateral lower extremities Pharmaceutical: sq Heparin  5000 q8             -antiplatelet  therapy: Asprin 81 mg daily    1/11 continue to hold heparin  given recent bleeding around PEG site.  H&H were stable.  SCDs ordered, teds.    -she's walking 175'+ with therapy, so sq hep no longer needed  3. Pain Management: history of peripheral neuropathy Gabapentin  cut back to 100 mg q12 d/t encephalopathy.   PRN tylenol  and tramadol .  -1/6: Right knee pain for approximately 1 week, will get x-ray, Voltaren  gel 4 times  daily, depending on findings may need injection--xray with Minimal tibial spine spur formation. Mild anterior patellar enthesophyte formation at the quadriceps tendon insertion. - 1/7: Robaxin  500 mg TID for muscle cramping in quads  1-9: Significant bilateral lower extremity sensitivity/pain.  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  - Increase gabapentin  to 200 mg twice daily; hesitant to increase further given AKI  - Resume Requip  1 mg nightly  - Resume duloxetine  20 mg daily  - Will increase Robaxin  to 750 mg 3 times daily--hesitant to switch to home tizanidine given risk of sedation but may consider this in the future   1/11  sl improvement in pain. Really can't push gabapentin  any more   -continue lidocaine  patches   -hopefully duloxetine  and requip  will help as well   1/12: Reduce gabapentin  to 100 mg 3 times daily due to AKI  1-13: Schedule tramadol  50 mg every 6 hours for pain reduction.  Reviewed CT abdomen pelvis December 24, some degenerative disks most severe at L5-S1 and L2-3, with some encroachment on the right L5-S1 neuroforamina.  Otherwise, spaces well-maintained, no obvious spinal stenosis.  1-14: Pain control improved.  Monitor.  1-15: Some concern tramadol  may be causing more lethargy and nausea.  Reduce to 25 mg every 6 hours as needed.  1-16: Doing very well with current regimen   4. Mood/Behavior/Sleep: LCSW to follow for  evaluation and support when available.              -antipsychotic agents: N/A             -Continue Ritalin  5 mg a.m. to encourage alertness     1-8: Mirtazapine  7.5 mg nightly added yesterday for sleep and appetite stimulation; patient overly lethargic today, will DC mirtazapine  to see if this improves  - DC'ed mirtazapine  due to lethargy, nausea.  Symptoms significantly improved.  1-13: Significant difficulties with motivation and fatigue ongoing. DC of Ritalin .  Will likely take a while to respond to resumption of home duloxetine  as above.  1-14: Daytime alertness, motivation did significantly respond to DC Ritalin .  Will resume.--With improvement 1-15   5. Neuropsych/cognition: This patient is capable of making decisions on her own behalf.   6. Skin/Wound Care: Routine pressure relief measures.   - Surgical sites look good   7. Fluids/Electrolytes/Nutrition/dysphagia: Monitor I&O and weight. Follow up labs CBC/CMP               Oropharyngeal dysphagia: Dysphagia: Secondary to CVA/anoxic injury.  -PEG placed on 12/30-- -Pt to receive a can of osmolite if eats less than 50% of meal -continue protein supp and free water  -consider appetite stimulant  1/6: Severe constipation contributor to nausea and poor meal intake; nutrition consult today.  Treating constipation as below.  1-7: Converting to bolus feeds if less than 50% meal completion via PEG tube.  Hopeful for p.o. improvement now that she has had a bowel movement  1-11 didn't eat much yesterday given nausea but appetite has been picking up. Hopefully will do better today  1-12: Restart IV fluids as below 1-13: P.o. intakes improving.  Supplement potassium chloride  20 mill equivalents twice daily for 2 days. 1-14: P.o. intake is very poor yesterday.  Resume Ritalin .  Start Megace  400 mg daily. 1-15: Ate only 15% of 1 meal yesterday.  Fluid flushes to 100 cc every 6 hours.  Patient knows to drink more to  keep up with this reduction,  see #19 below. 1-17: P.o. intakes much improved over the last 2 days, nearly 100%.  BMP with mild hypokalemia, will start standing 40 mill equivalent kdur, stop free water  flushes, encourage p.o. fluids and repeat Monday a.m.  8. Incisional hernia repair reexploration 12/4 and 12/5 c/b hematoma:  -Gen Surgery continuing to follow as needed.    9. Acute bilateral cerebellar/cerebral infarct secondary to profound intracranial hypoperfusion due to hemorrhagic shock/hypotension:  -Continue Asprin. Follow up with [facility] outpatient.    10. Acute metabolic encephalopathy: seems much improved, continue to monitor-delirium precautions.    11. ABLA: secondary to hemorrhagic shock/critical illness. Hemoglobin 8.8 (12/31). Monitor CBC.    - 1/6: hemoglobin stable on admission labs, add daily iron supplement 325 mg to assist in RBC repletion.  Add on iron levels and TIBC to labs today--WNL  1-8: DC iron supplement due to stomach upset, nausea  1-9: Bleeding around PEG tube site today.  H&H stable 8.5.  IR evaluated, appreciate recommendations.  Patient has abdominal binder to keep PEG in place when out of bed.  1/10-11 PEG site stable this w/e  12. AKI on CKD stage IIIb: continue to monitor renal function.  -Right renal infarct: incidental finding on CT imaging on 12/4-likely due to hypoperfusion in setting of hypotension. Continue supportive care.    - Creatinine stable 1.5, BUN remains elevated 42, encourage p.o. fluids.   1-8: Creatinine 1.6 today, BUN 40.  Discussed need to eat fluid intake with patient.  Depending on I's and O's today, may give IV fluids tomorrow.  1-9: Creatinine 1.9, 500 cc fluid bolus followed by 50 cc/h normal saline for 1 day.  BMP in AM.  Discussed increasing p.o. fluids with patient, husband.  1/11 BUN/Cr 43/1.78 yesterday, sl improvement   -continue to push fluids   -resume IVF if needed. Continue flushes thru PEG   -recheck labs Monday   1-12: BUN/creatinine remain elevated,  stable.  Give 1 L IV fluid today, increase PEG tube flushes to every 4 hours 200 cc.  Repeat in AM.  1-13: BUN normalized, creatinine stable 1.6.  DC IV fluids, monitor today.  - BUN continues to downtrend, creatinine stable 1.5-1.7.  Likely representing new baseline, monitor with adjustment of fluid flushes  Repeat BMP 1-17, may be able to reduce fluid flushes through PEG--urine/creatinine looks good, will DC free water  flushes.  14. HCAP (Enterobacter cloacae on tracheal aspirate culture): Completed antibiotic course.    15. Hypertension: monitor BP per protocol on Amlodipine  10 mg, Coreg  12.5 mg, and Hydralazine  50 mg.    - Blood pressure stable, monitor  - 1-13: Systolic BP up, diastolic remains stable.  Increase hydralazine  to 75 mg 3 times daily  1-14: BP elevated this a.m.  Prior to medications.  Increase Coreg  to 25 mg twice daily, monitor for bradycardia.  1-15: Blood pressure remains elevated, slightly better than it was yesterday.  Monitor 1 to 2 days with recent changes  -Blood pressure is fairly well-controlled 1-17-1-18    12/08/2024    1:38 PM 12/08/2024    5:49 AM 12/08/2024    5:22 AM  Vitals with BMI  Weight  103 lbs 6 oz   BMI  18.32   Systolic 127  165  Diastolic 57  78  Pulse 77  89    16. Hypothyrodism: continue Synthroid .    17. 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 Serene)             -  on Aspirin     18.  Severe constipation status post multiple intra-abdominal surgeries.  - Last bowel movement 12/28  - KUB today with large stool burden--no signs of obstruction--gave sorbitol  30 mL  Large bowel movement 1-7 with sorbitol ; increase MiraLAX  to twice daily and monitor  Last bowel movement 1-12, large; a few Smear bowel movements 1-14  1-17: P.o. intakes picking up, if no bowel movement the next 2 days will give extra regimen.  1-18: Last bowel movement 1-14; add daily MiraLAX   19.  Nausea.  Encouraged use of as needed  Zofran .  On Protonix .,  Will increase to twice daily AC.  - 1/11 recurrent yesterday   -d/w nurse, making sure we pretreat with zofran  before meds   -encourage eating something before taking meds as well   -she's moving bowels  1-15: KUB without concerning findings.  Reduce free water  flushes as above as volume has been exacerbating.  Schedule Zofran  4 mg 3 times daily with meals  1-16: Nausea much improved, p.o. intakes up.  Patient with more energy today.--Continued improvement through the weekend--resolved 1-18  20.  Chronic nocturia/urinary urgency.  - 1-18: Start Myrbetriq  25 mg daily; PVRs ordered.  LOS: 13 days A FACE TO FACE EVALUATION WAS PERFORMED  Joesph JAYSON Likes 12/08/2024, 8:15 PM     "

## 2024-12-08 NOTE — Plan of Care (Signed)
" °  Problem: Consults Goal: RH STROKE PATIENT EDUCATION Description: See Patient Education module for education specifics  Outcome: Progressing   Problem: RH BOWEL ELIMINATION Goal: RH STG MANAGE BOWEL WITH ASSISTANCE Description: STG Manage Bowel with supervision Assistance. Outcome: Progressing   Problem: RH BLADDER ELIMINATION Goal: RH STG MANAGE BLADDER WITH ASSISTANCE Description: STG Manage Bladder With supervision Assistance Outcome: Progressing   Problem: RH SKIN INTEGRITY Goal: RH STG SKIN FREE OF INFECTION/BREAKDOWN Description: Manage skin free of infection with supervision assistance Outcome: Progressing   Problem: RH SAFETY Goal: RH STG ADHERE TO SAFETY PRECAUTIONS W/ASSISTANCE/DEVICE Description: STG Adhere to Safety Precautions With Assistance/Device. Outcome: Progressing   Problem: RH PAIN MANAGEMENT Goal: RH STG PAIN MANAGED AT OR BELOW PT'S PAIN GOAL Description: <4 w/ prns Outcome: Progressing   Problem: RH KNOWLEDGE DEFICIT Goal: RH STG INCREASE KNOWLEDGE OF HYPERTENSION Description: Manage increase knowledge of hypertension with supervision assistance from husband using educational materials provided Outcome: Progressing Goal: RH STG INCREASE KNOWLEDGE OF DYSPHAGIA/FLUID INTAKE Description: Manage increase knowledge of dysphagia with supervision assistance from husband using educational materials provided Outcome: Progressing Goal: RH STG INCREASE KNOWLEDGE OF STROKE PROPHYLAXIS Description: Manage increase knowledge of stroke prophylaxis with supervision assistance from husband using educational materials provided Outcome: Progressing   "

## 2024-12-09 ENCOUNTER — Ambulatory Visit (HOSPITAL_COMMUNITY)

## 2024-12-09 ENCOUNTER — Ambulatory Visit (HOSPITAL_COMMUNITY): Admitting: Surgery

## 2024-12-09 ENCOUNTER — Other Ambulatory Visit (HOSPITAL_COMMUNITY): Payer: Self-pay

## 2024-12-09 DIAGNOSIS — R3915 Urgency of urination: Secondary | ICD-10-CM

## 2024-12-09 DIAGNOSIS — I10 Essential (primary) hypertension: Secondary | ICD-10-CM

## 2024-12-09 LAB — BASIC METABOLIC PANEL WITH GFR
Anion gap: 10 (ref 5–15)
BUN: 19 mg/dL (ref 8–23)
CO2: 22 mmol/L (ref 22–32)
Calcium: 9.2 mg/dL (ref 8.9–10.3)
Chloride: 103 mmol/L (ref 98–111)
Creatinine, Ser: 1.73 mg/dL — ABNORMAL HIGH (ref 0.44–1.00)
GFR, Estimated: 32 mL/min — ABNORMAL LOW
Glucose, Bld: 102 mg/dL — ABNORMAL HIGH (ref 70–99)
Potassium: 3.8 mmol/L (ref 3.5–5.1)
Sodium: 135 mmol/L (ref 135–145)

## 2024-12-09 LAB — CBC WITH DIFFERENTIAL/PLATELET
Abs Immature Granulocytes: 0.04 K/uL (ref 0.00–0.07)
Basophils Absolute: 0 K/uL (ref 0.0–0.1)
Basophils Relative: 0 %
Eosinophils Absolute: 0.1 K/uL (ref 0.0–0.5)
Eosinophils Relative: 1 %
HCT: 26.5 % — ABNORMAL LOW (ref 36.0–46.0)
Hemoglobin: 8.7 g/dL — ABNORMAL LOW (ref 12.0–15.0)
Immature Granulocytes: 1 %
Lymphocytes Relative: 17 %
Lymphs Abs: 1.4 K/uL (ref 0.7–4.0)
MCH: 31.5 pg (ref 26.0–34.0)
MCHC: 32.8 g/dL (ref 30.0–36.0)
MCV: 96 fL (ref 80.0–100.0)
Monocytes Absolute: 0.8 K/uL (ref 0.1–1.0)
Monocytes Relative: 10 %
Neutro Abs: 5.8 K/uL (ref 1.7–7.7)
Neutrophils Relative %: 71 %
Platelets: 284 K/uL (ref 150–400)
RBC: 2.76 MIL/uL — ABNORMAL LOW (ref 3.87–5.11)
RDW: 14.5 % (ref 11.5–15.5)
WBC: 8.2 K/uL (ref 4.0–10.5)
nRBC: 0 % (ref 0.0–0.2)

## 2024-12-09 LAB — GLUCOSE, CAPILLARY
Glucose-Capillary: 96 mg/dL (ref 70–99)
Glucose-Capillary: 99 mg/dL (ref 70–99)
Glucose-Capillary: 93 mg/dL (ref 70–99)

## 2024-12-09 MED ORDER — LEVOTHYROXINE SODIUM 75 MCG PO TABS
75.0000 ug | ORAL_TABLET | Freq: Every day | ORAL | 0 refills | Status: AC
  Filled 2024-12-09: qty 30, 30d supply, fill #0

## 2024-12-09 MED ORDER — GABAPENTIN 100 MG PO CAPS
100.0000 mg | ORAL_CAPSULE | Freq: Three times a day (TID) | ORAL | 0 refills | Status: DC
  Filled 2024-12-09: qty 90, 30d supply, fill #0

## 2024-12-09 MED ORDER — METHOCARBAMOL 750 MG PO TABS
750.0000 mg | ORAL_TABLET | Freq: Three times a day (TID) | ORAL | 0 refills | Status: AC
  Filled 2024-12-09: qty 90, 30d supply, fill #0

## 2024-12-09 MED ORDER — POLYETHYLENE GLYCOL 3350 17 G PO PACK: 17.0000 g | PACK | Freq: Every day | ORAL | Status: AC

## 2024-12-09 MED ORDER — METHYLPHENIDATE HCL 5 MG PO TABS
5.0000 mg | ORAL_TABLET | Freq: Two times a day (BID) | ORAL | 0 refills | Status: AC
  Filled 2024-12-09: qty 60, 30d supply, fill #0

## 2024-12-09 MED ORDER — DULOXETINE HCL 20 MG PO CPEP
20.0000 mg | ORAL_CAPSULE | Freq: Every day | ORAL | 0 refills | Status: AC
  Filled 2024-12-09: qty 30, 30d supply, fill #0

## 2024-12-09 MED ORDER — OXYBUTYNIN CHLORIDE 5 MG PO TABS
5.0000 mg | ORAL_TABLET | Freq: Two times a day (BID) | ORAL | 0 refills | Status: AC
  Filled 2024-12-09: qty 60, 30d supply, fill #0

## 2024-12-09 MED ORDER — ACETAMINOPHEN 325 MG PO TABS: 325.0000 mg | ORAL_TABLET | ORAL | Status: AC | PRN

## 2024-12-09 MED ORDER — OXYBUTYNIN CHLORIDE ER 10 MG PO TB24
10.0000 mg | ORAL_TABLET | Freq: Every day | ORAL | 0 refills | Status: DC
  Filled 2024-12-09: qty 30, 30d supply, fill #0

## 2024-12-09 MED ORDER — TRAMADOL HCL 50 MG PO TABS
25.0000 mg | ORAL_TABLET | Freq: Four times a day (QID) | ORAL | 0 refills | Status: AC | PRN
  Filled 2024-12-09: qty 14, 7d supply, fill #0

## 2024-12-09 MED ORDER — SENNOSIDES-DOCUSATE SODIUM 8.6-50 MG PO TABS: 1.0000 | ORAL_TABLET | Freq: Every day | ORAL | Status: AC

## 2024-12-09 MED ORDER — CARVEDILOL 25 MG PO TABS
25.0000 mg | ORAL_TABLET | Freq: Two times a day (BID) | ORAL | 0 refills | Status: AC
  Filled 2024-12-09: qty 60, 30d supply, fill #0

## 2024-12-09 MED ORDER — ASPIRIN 81 MG PO TBEC: 81.0000 mg | DELAYED_RELEASE_TABLET | Freq: Every day | ORAL | Status: AC

## 2024-12-09 MED ORDER — LIDOCAINE 5 % EX PTCH
3.0000 | MEDICATED_PATCH | Freq: Every day | CUTANEOUS | 0 refills | Status: AC
  Filled 2024-12-09: qty 30, 10d supply, fill #0

## 2024-12-09 MED ORDER — HYDROCORTISONE 1 % EX CREA: TOPICAL_CREAM | Freq: Three times a day (TID) | CUTANEOUS | Status: AC | PRN

## 2024-12-09 MED ORDER — MELATONIN 5 MG PO TABS: 5.0000 mg | ORAL_TABLET | Freq: Every evening | ORAL | Status: AC | PRN

## 2024-12-09 MED ORDER — DICLOFENAC SODIUM 1 % EX GEL: 2.0000 g | Freq: Four times a day (QID) | CUTANEOUS | Status: AC

## 2024-12-09 MED ORDER — OXYBUTYNIN CHLORIDE 5 MG PO TABS
5.0000 mg | ORAL_TABLET | Freq: Two times a day (BID) | ORAL | Status: DC
  Administered 2024-12-09 – 2024-12-10 (×2): 5 mg via ORAL
  Filled 2024-12-09 (×2): qty 1

## 2024-12-09 MED ORDER — MEGESTROL ACETATE 400 MG/10ML PO SUSP
400.0000 mg | Freq: Every day | ORAL | 0 refills | Status: AC
  Filled 2024-12-09: qty 240, 24d supply, fill #0

## 2024-12-09 MED ORDER — HYDRALAZINE HCL 25 MG PO TABS
75.0000 mg | ORAL_TABLET | Freq: Three times a day (TID) | ORAL | 0 refills | Status: AC
  Filled 2024-12-09: qty 270, 30d supply, fill #0

## 2024-12-09 MED ORDER — GERHARDT'S BUTT CREAM: 1.0000 | TOPICAL_CREAM | Freq: Two times a day (BID) | CUTANEOUS | Status: AC

## 2024-12-09 MED ORDER — ONDANSETRON 4 MG PO TBDP
4.0000 mg | ORAL_TABLET | Freq: Three times a day (TID) | ORAL | 0 refills | Status: AC
  Filled 2024-12-09: qty 20, 7d supply, fill #0

## 2024-12-09 MED ORDER — POTASSIUM CHLORIDE CRYS ER 20 MEQ PO TBCR
40.0000 meq | EXTENDED_RELEASE_TABLET | Freq: Every day | ORAL | 0 refills | Status: AC
  Filled 2024-12-09: qty 60, 30d supply, fill #0

## 2024-12-09 MED ORDER — PANTOPRAZOLE SODIUM 40 MG PO TBEC
40.0000 mg | DELAYED_RELEASE_TABLET | Freq: Two times a day (BID) | ORAL | 0 refills | Status: AC
  Filled 2024-12-09: qty 60, 30d supply, fill #0

## 2024-12-09 NOTE — Progress Notes (Addendum)
 "                                                        PROGRESS NOTE   Subjective/Complaints: Pt up in gym with OT and husband. Very grateful to the rehab team for her recovery and excited to go home!! Ongoing urinary frequency  ROS: Patient denies fever, rash, sore throat, blurred vision, dizziness, nausea, vomiting, diarrhea, cough, shortness of breath or chest pain, joint or back/neck pain, headache, or mood change.    Objective:   No results found.   Recent Labs    12/09/24 0546  WBC 8.2  HGB 8.7*  HCT 26.5*  PLT 284    Recent Labs    12/07/24 0646 12/09/24 0546  NA 133* 135  K 3.3* 3.8  CL 101 103  CO2 21* 22  GLUCOSE 97 102*  BUN 17 19  CREATININE 1.65* 1.73*  CALCIUM  9.1 9.2    Intake/Output Summary (Last 24 hours) at 12/09/2024 0932 Last data filed at 12/09/2024 0744 Gross per 24 hour  Intake 712 ml  Output --  Net 712 ml        Physical Exam: Vital Signs Blood pressure (!) 158/66, pulse 75, temperature 99 F (37.2 C), temperature source Oral, resp. rate 18, height 5' 3 (1.6 m), weight 46.9 kg, SpO2 99%.   Constitutional: No distress . Vital signs reviewed. HEENT: NCAT, EOMI, oral membranes moist Neck: supple Cardiovascular: RRR without murmur. No JVD    Respiratory/Chest: CTA Bilaterally without wheezes or rales. Normal effort    GI/Abdomen: BS +, non-tender, non-distended Ext: no clubbing, cyanosis, or edema Psych: pleasant and cooperative  Skin:   - hernia repair site closed, Steri-Strips off.  Healing well. - PEG site remains clean, dry, intact.  MSK:      No apparent deformities       Neurologic exam:  Cognition: AAO to person, place, and time.   Follows commands appropriately. Minimal dysarthria. Near normal language  Memory: Poor, but improving Insight: Fair insight into current condition.  Sensation: Allodynia to palpation of the bilateral proximal thighs--improved  CN: 2-12 grossly intact.   Coordination: BL UE and LE  ataxia  Spasticity: MAS 0 in all extremities.   Strength: 5-/5 throughout-slightly more weak on the left extremity--stable 1/19   Assessment/Plan: 1. Functional deficits which require 3+ hours per day of interdisciplinary therapy in a comprehensive inpatient rehab setting. Physiatrist is providing close team supervision and 24 hour management of active medical problems listed below. Physiatrist and rehab team continue to assess barriers to discharge/monitor patient progress toward functional and medical goals  Care Tool:  Bathing    Body parts bathed by patient: Right arm, Chest, Abdomen, Front perineal area, Right upper leg, Left upper leg, Left arm, Right lower leg, Left lower leg, Face   Body parts bathed by helper: Buttocks, Left arm, Right lower leg, Left lower leg     Bathing assist Assist Level: Minimal Assistance - Patient > 75%     Upper Body Dressing/Undressing Upper body dressing   What is the patient wearing?: Pull over shirt    Upper body assist Assist Level: Set up assist    Lower Body Dressing/Undressing Lower body dressing      What is the patient wearing?: Underwear/pull up, Pants     Lower  body assist Assist for lower body dressing: Contact Guard/Touching assist     Toileting Toileting    Toileting assist Assist for toileting: Moderate Assistance - Patient 50 - 74%     Transfers Chair/bed transfer  Transfers assist     Chair/bed transfer assist level: Minimal Assistance - Patient > 75%     Locomotion Ambulation   Ambulation assist      Assist level: 2 helpers Assistive device: No Device Max distance: 130'   Walk 10 feet activity   Assist     Assist level: Minimal Assistance - Patient > 75% Assistive device: No Device   Walk 50 feet activity   Assist    Assist level: 2 helpers Assistive device: No Device    Walk 150 feet activity   Assist Walk 150 feet activity did not occur: Safety/medical concerns          Walk 10 feet on uneven surface  activity   Assist     Assist level: Minimal Assistance - Patient > 75%     Wheelchair     Assist Is the patient using a wheelchair?: Yes Type of Wheelchair: Manual    Wheelchair assist level: Dependent - Patient 0% Max wheelchair distance: 150'    Wheelchair 50 feet with 2 turns activity    Assist        Assist Level: Dependent - Patient 0%   Wheelchair 150 feet activity     Assist      Assist Level: Dependent - Patient 0%   Blood pressure (!) 158/66, pulse 75, temperature 99 F (37.2 C), temperature source Oral, resp. rate 18, height 5' 3 (1.6 m), weight 46.9 kg, SpO2 99%.   Medical Problem List and Plan: 1. 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 Family ed   2.  Antithrombotics: -DVT/anticoagulation:  Mechanical: Sequential compression devices, below knee Bilateral lower extremities Pharmaceutical: sq Heparin  5000 q8             -antiplatelet therapy: Asprin 81 mg daily    1/11 continue to hold heparin  given recent bleeding around PEG site.  H&H were stable.  SCDs ordered, teds.    -she's walking 175'+ with therapy, so sq hep no longer needed  3. Pain Management: history of peripheral neuropathy Gabapentin  cut back to 100 mg q12 d/t encephalopathy.   PRN tylenol  and tramadol .  -1/6: Right knee pain for approximately 1 week, will get x-ray, Voltaren  gel 4 times daily, depending on findings may need injection--xray with Minimal tibial spine spur formation. Mild anterior patellar enthesophyte formation at the quadriceps tendon insertion. - 1/7: Robaxin  500 mg TID for muscle cramping in quads  1-9: Significant bilateral lower extremity sensitivity/pain.  on chart review, for neuropathy secondary to  peripheral artery disease was on gabapentin  600 mg 3 times daily, Requip , duloxetine , and tizanidine as outpatient.  - Increase gabapentin  to 200 mg twice daily; hesitant to increase further given AKI  - Resume Requip  1 mg nightly  - Resume duloxetine  20 mg daily  - Will increase Robaxin  to 750 mg 3 times daily--hesitant to switch to home tizanidine given risk of sedation but may consider this  in the future   1/11  sl improvement in pain. Really can't push gabapentin  any more   -continue lidocaine  patches   -hopefully duloxetine  and requip  will help as well   1/12: Reduce gabapentin  to 100 mg 3 times daily due to AKI  1-13: Schedule tramadol  50 mg every 6 hours for pain reduction.  Reviewed CT abdomen pelvis December 24, some degenerative disks most severe at L5-S1 and L2-3, with some encroachment on the right L5-S1 neuroforamina.  Otherwise, spaces well-maintained, no obvious spinal stenosis.  1-14: Pain control improved.  Monitor.  1-15: Some concern tramadol  may be causing more lethargy and nausea.  Reduce to 25 mg every 6 hours as needed.  1-19 pain appears controlled   4. Mood/Behavior/Sleep: LCSW to follow for evaluation and support when available.              -antipsychotic agents: N/A             -Continue Ritalin  5 mg a.m. to encourage alertness     1-8: Mirtazapine  7.5 mg nightly added yesterday for sleep and appetite stimulation; patient overly lethargic today, will DC mirtazapine  to see if this improves  - DC'ed mirtazapine  due to lethargy, nausea.  Symptoms significantly improved.  1-13: Significant difficulties with motivation and fatigue ongoing. DC of Ritalin .  Will likely take a while to respond to resumption of home duloxetine  as above.  1-14: Daytime alertness, motivation did significantly respond to DC Ritalin .  Will resume.--With improvement 1-15  1/19--wean ritalin  at discharge? 5. Neuropsych/cognition: This patient is capable of making decisions on her own behalf.    6. Skin/Wound Care: Routine pressure relief measures.   - Surgical sites look good   7. Fluids/Electrolytes/Nutrition/dysphagia: Monitor I&O and weight. Follow up labs CBC/CMP               Oropharyngeal dysphagia: Dysphagia: Secondary to CVA/anoxic injury.  -PEG placed on 12/30-- -Pt to receive a can of osmolite if eats less than 50% of meal -continue protein supp and free water  -consider appetite stimulant  1/6: Severe constipation contributor to nausea and poor meal intake; nutrition consult today.  Treating constipation as below.  1-7: Converting to bolus feeds if less than 50% meal completion via PEG tube.  Hopeful for p.o. improvement now that she has had a bowel movement  1-11 didn't eat much yesterday given nausea but appetite has been picking up. Hopefully will do better today  1-12: Restart IV fluids as below 1-13: P.o. intakes improving.  Supplement potassium chloride  20 mill equivalents twice daily for 2 days. 1-14: P.o. intake is very poor yesterday.  Resume Ritalin .  Start Megace  400 mg daily. 1-15: Ate only 15% of 1 meal yesterday.  Fluid flushes to 100 cc every 6 hours.  Patient knows to drink more to keep up with this reduction, see #19 below. 1-17: P.o. intakes much improved over the last 2 days, nearly 100%.  BMP with mild hypokalemia, will start standing 40 mill equivalent kdur, stop free water  flushes, encourage p.o. fluids and repeat Monday a.m. 1/19--eating well! Labs near baseline, K+ 3.8 8. Incisional hernia repair reexploration 12/4 and 12/5 c/b hematoma:  -Gen Surgery continuing to follow as needed.    9. Acute bilateral cerebellar/cerebral infarct secondary to profound intracranial hypoperfusion due to hemorrhagic shock/hypotension:  -Continue Asprin. Follow up with [facility] outpatient.    10. Acute metabolic encephalopathy: seems much improved, continue to monitor-delirium precautions.    11. ABLA: secondary to hemorrhagic shock/critical illness. Hemoglobin 8.8  (  12/31). Monitor CBC.    - 1/6: hemoglobin stable on admission labs, add daily iron supplement 325 mg to assist in RBC repletion.  Add on iron levels and TIBC to labs today--WNL  1-8: DC iron supplement due to stomach upset, nausea  1-9: Bleeding around PEG tube site today.  H&H stable 8.5.  IR evaluated, appreciate recommendations.  Patient has abdominal binder to keep PEG in place when out of bed.  1/19 hgb 8.7, stable  12. AKI on CKD stage IIIb: continue to monitor renal function.  -Right renal infarct: incidental finding on CT imaging on 12/4-likely due to hypoperfusion in setting of hypotension. Continue supportive care.    - Creatinine stable 1.5, BUN remains elevated 42, encourage p.o. fluids.   1-8: Creatinine 1.6 today, BUN 40.  Discussed need to eat fluid intake with patient.  Depending on I's and O's today, may give IV fluids tomorrow.  1-9: Creatinine 1.9, 500 cc fluid bolus followed by 50 cc/h normal saline for 1 day.  BMP in AM.  Discussed increasing p.o. fluids with patient, husband.  1/11 BUN/Cr 43/1.78 yesterday, sl improvement   -continue to push fluids   -resume IVF if needed. Continue flushes thru PEG   -recheck labs Monday   1-12: BUN/creatinine remain elevated, stable.  Give 1 L IV fluid today, increase PEG tube flushes to every 4 hours 200 cc.  Repeat in AM.  1-13: BUN normalized, creatinine stable 1.6.  DC IV fluids, monitor today.  - BUN continues to downtrend, creatinine stable 1.5-1.7.  Likely representing new baseline, monitor with adjustment of fluid flushes  Repeat BMP 1-17, may be able to reduce fluid flushes through PEG--urine/creatinine looks good, will DC free water  flushes.  1/19 BUN/Cr appear stable off H2O flushes 14. HCAP (Enterobacter cloacae on tracheal aspirate culture): Completed antibiotic course.    15. Hypertension: monitor BP per protocol on Amlodipine  10 mg, Coreg  12.5 mg, and Hydralazine  50 mg.    - Blood pressure stable, monitor  - 1-13:  Systolic BP up, diastolic remains stable.  Increase hydralazine  to 75 mg 3 times daily  1-14: BP elevated this a.m.  Prior to medications.  Increase Coreg  to 25 mg twice daily, monitor for bradycardia.  1-15: Blood pressure remains elevated, slightly better than it was yesterday.  Monitor 1 to 2 days with recent changes  -Blood pressure is borderline to fairly well-controlled 1/19    12/09/2024    6:13 AM 12/08/2024    8:36 PM 12/08/2024    1:38 PM  Vitals with BMI  Systolic 158 120 872  Diastolic 66 55 57  Pulse 75 74 77    16. Hypothyrodism: continue Synthroid .    17. 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 Serene)             -on Aspirin     18.  Severe constipation status post multiple intra-abdominal surgeries.  - Last bowel movement 12/28  - KUB today with large stool burden--no signs of obstruction--gave sorbitol  30 mL  Large bowel movement 1-7 with sorbitol ; increase MiraLAX  to twice daily and monitor  Last bowel movement 1-12, large; a few Smear bowel movements 1-14  1-17: P.o. intakes picking up, if no bowel movement the next 2 days will give extra regimen.  1-18: Last bowel movement 1-14; add daily MiraLAX   1/19 no bm since 1/15--give 30cc sorbitol  today 19.  Nausea.  Encouraged use of as needed Zofran .  On Protonix .,  Will increase to  twice daily AC.  - 1/11 recurrent yesterday   -d/w nurse, making sure we pretreat with zofran  before meds   -encourage eating something before taking meds as well   -she's moving bowels  1-15: KUB without concerning findings.  Reduce free water  flushes as above as volume has been exacerbating.  Schedule Zofran  4 mg 3 times daily with meals  1-16: Nausea much improved, p.o. intakes up.  Patient with more energy today.--Continued improvement through the weekend--resolved 1-18/19  20.  Chronic nocturia/urinary urgency.  - 1-18: Start Myrbetriq  25 mg daily; PVRs ordered.    1/19 fairly continent but still  frequent--observe today   -needs PVR's checked--will re-order LOS: 14 days A FACE TO FACE EVALUATION WAS PERFORMED  Arthea ONEIDA Gunther 12/09/2024, 9:32 AM     "

## 2024-12-09 NOTE — Progress Notes (Signed)
 Physical Therapy Session Note  Patient Details  Name: Erin [name] MRN: [id number] Date of Birth: June 24, 1956  Today's Date: 12/09/2024 PT Individual Time: 1300-1358 PT Individual Time Calculation (min): 58 min   Short Term Goals: Week 2:  PT Short Term Goal 1 (Week 2): STGs = LTGs  Skilled Therapeutic Interventions/Progress Updates:      Pt sitting EOB upon arrival with pt husband in room. Pt agreeable to therapy. Pt denies any pain.   Pt ambulated throughout unit with RW and close supervision, with no AD and CGA.   Treatment session focused on higher level gait, dynamic standing balance:   Pt navigated 4 inch hurdles x8 leading with R LE, x8 leading with L LE and HHA  Pt ambulated forwards/backwards and laterally x 30 feet with no AD and CGA intermittent min A with fatigue and misstep.   Pt stood with feet together with no AD and close supervision/CGA with mod A for lateral LOB to the L with environmental distraction pt demonstrating limited righting  reaction.   Discussed methods to reduce fall risk, pt provided teach back of need for RW, all lights on when going to bathroom, not getting up without corky present.   Pt seated EOB at end of session with all needs within reach, and bed alalrm on with pt husband in room.   Therapy Documentation Precautions:  Precautions Precautions: Fall Recall of Precautions/Restrictions: Impaired Precaution/Restrictions Comments: Peg Tube; R-sided hemiparesis Restrictions Weight Bearing Restrictions Per Provider Order: No   Therapy/Group: Individual Therapy  [facility] Doreene Orris, [place], DPT  12/09/2024, 7:43 AM

## 2024-12-09 NOTE — Progress Notes (Signed)
 Speech Language Pathology Discharge Summary  Patient Details  Name: Erin [name] MRN: [id number] Date of Birth: 03/02/1956  Date of Discharge from SLP service:December 09, 2024  Today's Date: 12/09/2024 SLP Individual Time: 8969-8884 SLP Individual Time Calculation (min): 45 min   Skilled Therapeutic Interventions:   Skilled therapy session focused on cognitive and communication goals.  Patient oriented x4 independently. SLP targeted cognitive goals through medication management task. Patient completed BID pillbox per written and verbalized instructions with supervision A and independently corrected mistakes. SLP then targeted communication through re-evaluating expressive/receptive language. Expression is characterized by occasional phonemic paraphasias and anomia, though patient is independently aware of mistakes and able to self correct with extended time. Patient with 100% accuracy during confrontational, responsive and phrase completion given extra time. Repetition intact. Receptive language is characterized by 100% accuracy during 1-3 step commands and improved accuracy of complex yes/no given repetition of question. Patient left in bed with alarm set and call bell in reach. Continue POC.      Patient has met 4 of 5 long term goals.  Patient to discharge at overall Supervision;Min level.  Reasons goals not met: cont to require minA for complex receptive language   Clinical Impression/Discharge Summary: Pt has made good gains and has met 4 of 5 LTG's this admission due to improved cognition and communication. Pt is currently an overall supervisionA for cognitive-linguistic tasks and requires supervision cues for utilization of swallowing compensatory strategies to minimize overt s/sx of aspiration with regular/thin diet. Pt/family education complete and pt will discharge home with 24 hour supervision from friends/family/etc. Pt would benefit from f/u ST services to maximize communication  and cognition in order to maximize functional independence.   Care Partner:  Caregiver Able to Provide Assistance: Yes  Type of Caregiver Assistance: Physical;Cognitive  Recommendation:  24 hour supervision/assistance;Outpatient SLP;Home Health SLP  Rationale for SLP Follow Up: Maximize functional communication;Maximize cognitive function and independence   Equipment: n/a   Reasons for discharge: Discharged from hospital   Patient/Family Agrees with Progress Made and Goals Achieved: Yes    [name] M.A., CCC-SLP 12/09/2024, 12:18 PM

## 2024-12-09 NOTE — Progress Notes (Signed)
 Occupational Therapy Session Note  Patient Details  Name: Erin [name] MRN: [id number] Date of Birth: 1956/08/09  Today's Date: 12/09/2024 OT Individual Time: 9154-9049 OT Individual Time Calculation (min): 65 min    Short Term Goals: Week 2:  OT Short Term Goal 1 (Week 2): STG= LTG d/t ELOS  Skilled Therapeutic Interventions/Progress Updates:    Pt received supine with no c/o pain, agreeable to OT session. Discussed d/c planning with her husband [name] to ensure he feels comfortable with family edu received and has all recommended DME. She came to EOB with (S). She donned pants with multiple LOB to the L in sitting, but able to come back into sitting. At this time suspect this is d/t lack of effort to correct balance. She stood to pull up pants with close (S). She is supervision for stand pivot transfer, but CGA for mobility d/t this lack of activation of righting reactions. Explained this in depth to her husband and he was able to teach back edu and appropriately demonstration guarding techniques for 100 ft of functional mobility. She completed standing level oral care with close (S). Toileting tasks with close (S). In the therapy gym completed a visual screening and pt demonstrated dysmetric smooth pursuits, impaired convergence and overshooting. Provided an extensive HEP that included smooth pursuits, VOR training, head turns, saccades, head circles, and ankle/body sways with her husband guarding. She returned to her room following, her husband providing CGA- (S) throughout. She transferred back to bed with (S) and was left with all needs met.   Therapy Documentation Precautions:  Precautions Precautions: Fall Recall of Precautions/Restrictions: Impaired Precaution/Restrictions Comments: Peg Tube; R-sided hemiparesis Restrictions Weight Bearing Restrictions Per Provider Order: No Therapy/Group: Individual Therapy  Nena VEAR Moats 12/09/2024, 9:57 AM

## 2024-12-09 NOTE — Progress Notes (Signed)
 Nutrition Follow Up  DOCUMENTATION CODES:   Severe malnutrition in context of chronic illness, Underweight  INTERVENTION:  Encouraged adequate intake of meals to meet calorie and protein needs Magic cup TID with meals, each supplement provides 290 kcal and 9 grams of protein Pt does not like any other ONS  Discussed discharge nutrition information; encouraged continued high calorie, high protein intake to address malnutrition  NUTRITION DIAGNOSIS:   Severe Malnutrition related to chronic illness as evidenced by severe fat depletion, severe muscle depletion. Remains applicable  GOAL:   Patient will meet greater than or equal to 90% of their needs Met  MONITOR:   PO intake, Supplement acceptance, TF tolerance  REASON FOR ASSESSMENT:   Consult Assessment of nutrition requirement/status, Enteral/tube feeding initiation and management  ASSESSMENT:   Pt with hx of GERD, IBS, CAD, and HTN. Recent admission for elective hernia repair 12/3 complicated by hypotension and returned to OR 12/4 for ex lap then subsequently abdomen closed 12/5 with no signs of active bleeding. Post stat CT 12/8 concerning for CVA. Course further complicated by dysphagia and malnutrition which led to PEG placement 12/30. Admitted to CIR for comprehensive rehab.   Spoke with pt and husband at bedside. Pt very excited about progress she has made in rehab. Pt set to discharge tomorrow and is ready to go home. Pt very appreciative of therapists who worked with her and helped her get stronger. Pt reports appetite has significantly improved with appetite stimulant, eating 90-100% of meals the last week. Encouraged pt to keep up her intake once home and continue to eat high calorie, high protein meals to meet nutrition needs. Encouraged continued exercise to continue progress once home. Encouraged pt to find bowel regimen at home that works well to keep her regular. Pt eager to have PEG removed once able.   Added  education to discharge summary.  Average Meal Completion: 1/6: 40% average intake x 1 recorded meals 1/11-1/13: 55% average intake x 6 recorded meals 1/17-1/19: 97% average intake x 8 recorded meals  Nutritionally Relevant Medications: Levothyroxine  Megace   Ritalin   Zofran   Protonix  Miralax  Potassium chloride  40 mEq Senna  Labs reviewed: Potassium 3.8 CBG x 24 h: 70-109 mg/dL Creatinine 8.26 J8r 4.9  Admit weight: 43.1 kg Current weight: 46.9 kg   Diet Order:   Diet Order             Diet regular Room service appropriate? Yes with Assist; Fluid consistency: Thin  Diet effective now                   EDUCATION NEEDS:   Education needs have been addressed  Skin:  Skin Assessment: Skin Integrity Issues: Skin Integrity Issues:: Incisions Incisions: abdomen  Last BM:  1/15  Height:   Ht Readings from Last 1 Encounters:  11/25/24 5' 3 (1.6 m)    Weight:   Wt Readings from Last 1 Encounters:  12/08/24 46.9 kg    Ideal Body Weight:  52.27 kg  BMI:  Body mass index is 18.32 kg/m.  Estimated Nutritional Needs:   Kcal:  1500-1700  Protein:  65-85g  Fluid:  1.5-1.7L    Josette Glance, MS, RDN, LDN Clinical Dietitian I Please reach out via secure chat

## 2024-12-09 NOTE — Plan of Care (Signed)
" °  Problem: RH Expression Communication Goal: LTG Patient will increase word finding of common (SLP) Description: LTG:  Patient will increase word finding of common objects/daily info/abstract thoughts with cues using compensatory strategies (SLP). 12/09/2024 1221 by Stephanie, Daysen Gundrum F, CCC-SLP Outcome: Completed/Met    Problem: RH Problem Solving Goal: LTG Patient will demonstrate problem solving for (SLP) Description: LTG:  Patient will demonstrate problem solving for basic/complex daily situations with cues  (SLP) 12/09/2024 1221 by Stephanie, Jonica Bickhart F, CCC-SLP Outcome: Completed/Met    Problem: RH Memory Goal: LTG Patient will demonstrate ability for day to day (SLP) Description: LTG:   Patient will demonstrate ability for day to day recall/carryover during cognitive/linguistic activities with assist  (SLP) 12/09/2024 1221 by Neal Trulson F, CCC-SLP Outcome: Completed/Met    Problem: RH Comprehension Communication Goal: LTG Patient will comprehend basic/complex auditory (SLP) Description: LTG: Patient will comprehend basic/complex auditory information with cues (SLP). 12/09/2024 1221 by Reynol Arnone F, CCC-SLP Outcome: Not Met (cont to require minA)    "

## 2024-12-09 NOTE — Progress Notes (Signed)
 Inpatient Rehabilitation Care Coordinator Discharge Note   Patient Details  Name: Erin [name] MRN: [id number] Date of Birth: 1956-02-16   Discharge location: D/c to home with husband  Length of Stay: 14 days  Discharge activity level: CGA  Home/community participation: Limited  Patient response un:Yzjouy Literacy - How often do you need to have someone help you when you read instructions, pamphlets, or other written material from your doctor or pharmacy?: Never  Patient response un:Dnrpjo Isolation - How often do you feel lonely or isolated from those around you?: Never  Services provided included: MD, PT, RD, OT, SLP, RN, CM, TR, Pharmacy, Neuropsych, SW  Financial Services:  [employer] Se[employer] Utilized: Private Insurance [facility] Medicare  Choices offered to/list presented to: pt husband  Follow-up services arranged:  Outpatient, DME    Outpatient Servicies: [facility] [address] for Pt/OT/LSLP DME : Has access to all DME needed    Patient response to transportation need: Is the patient able to respond to transportation needs?: Yes In the past 12 months, has lack of transportation kept you from medical appointments or from getting medications?: No In the past 12 months, has lack of transportation kept you from meetings, work, or from getting things needed for daily living?: No   Patient/Family verbalized understanding of follow-up arrangements:  Yes  Individual responsible for coordination of the follow-up plan: contact pt husband Erin [name]  Confirmed correct DME delivered: Erin [name] Erin [name] 12/09/2024    Comments (or additional information):fam  edu completed  Summary of Stay    Date/Time Discharge Planning CSW  12/03/24 0956 Pt will d/c to home with her husband who is primary caregiver. Husband reports there is additional support if needed. SW will confirm there are no barriers to discharge. AAC  11/26/24 0952 TBA AAC       Erin [name] Erin [name]

## 2024-12-09 NOTE — Progress Notes (Signed)
 Occupational Therapy Discharge Summary  Patient Details  Name: Erin [name] MRN: [id number] Date of Birth: [birth year]  Date of Discharge from OT service:December 10, 2023  Today's Date: 12/09/2024 OT Individual Time: 1132-1203 OT Individual Time Calculation (min): 31 min    Patient has met 8 of 9 long term goals due to improved activity tolerance, improved balance, postural control, ability to compensate for deficits, improved awareness, and improved coordination.  Patient to discharge at overall Supervision level.  Patient's care partner is independent to provide the necessary physical and cognitive assistance at discharge.  Erin [name] has made good progress in CIR and progressed to a (S)- CGA level. 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   Reasons goals not met: Recommend pt have CGA during higher level dynamic standing balance 2/2 poor righting reactions vs low effort to correct LOB.   Recommendation:  Patient will benefit from ongoing skilled OT services in outpatient setting to continue to advance functional skills in the area of BADL and iADL.  Equipment: Patient has all recommended DME  Reasons for discharge: treatment goals met and discharge from hospital  Patient/family agrees with progress made and goals achieved: Yes  Skilled OT Intervention Pt received supine with no c/o pain, agreeable to OT session. Pt completed functional mobility to the therapy gym, 173ft, with close (S)!  OT provided HEP and provided hands on facilitation to ensure proper technique. Teachback for full HEP listed below. Pt returned to her room via 100 ft of functional mobility with CGA. She was left supine with all needs met.   Access Code: [email or web address] URL: [email or web address] Date: 12/09/2024 Prepared by: Nena Moats  Exercises - Seated Shoulder Flexion with Self-Anchored Resistance  - 1 x daily - 7 x weekly - 3 sets - 10  reps - Seated Shoulder Horizontal Abduction with Resistance  - 1 x daily - 7 x weekly - 3 sets - 10 reps - Seated Shoulder Row with Resistance Anchored at Feet  - 1 x daily - 7 x weekly - 3 sets - 10 reps - Seated Elbow Flexion with Self-Anchored Resistance  - 1 x daily - 7 x weekly - 3 sets - 10 reps - Seated Elbow Extension with Self-Anchored Resistance  - 1 x daily - 7 x weekly - 3 sets - 10 reps   OT Discharge Precautions/Restrictions  Precautions Precautions: Fall Precaution/Restrictions Comments: Peg Tube; R-sided hemiparesis Restrictions Weight Bearing Restrictions Per Provider Order: No    ADL ADL Eating: Supervision/safety Where Assessed-Eating: Chair Grooming: Supervision/safety Where Assessed-Grooming: Standing at sink Upper Body Bathing: Supervision/safety Where Assessed-Upper Body Bathing: Edge of bed Lower Body Bathing: Supervision/safety Where Assessed-Lower Body Bathing: Edge of bed Upper Body Dressing: Supervision/safety Where Assessed-Upper Body Dressing: Edge of bed Lower Body Dressing: Supervision/safety Where Assessed-Lower Body Dressing: Edge of bed Toileting: Supervision/safety Where Assessed-Toileting: Toilet, Bedside Commode Toilet Transfer: Furniture Conservator/restorer Method: Surveyor, Minerals: Engineer, Site Method: Engineer, Technical Sales: Information systems manager without back Geologist, Engineering Method: Stand pivot Vision Baseline Vision/History: 1 Wears glasses Patient Visual Report: Blurring of vision Vision Assessment?: Yes Eye Alignment: Within Functional Limits Ocular Range of Motion: Within Functional Limits Alignment/Gaze Preference: Within Defined Limits Tracking/Visual Pursuits: Decreased smoothness of vertical tracking;Decreased smoothness of horizontal tracking Saccades: Overshoots Convergence: Impaired  (comment) Depth Perception: Overshoots Additional Comments: Pt with impaired smooth pursuits, saccades, and convergence Perception  Perception: Impaired Perception-Other Comments: R inattention at times Praxis Praxis: WFL Cognition Cognition Overall Cognitive Status: Impaired/Different from baseline Arousal/Alertness: Awake/alert Orientation Level: Person;Place;Situation Person: Oriented Place: Oriented Situation: Oriented Memory: Impaired Memory Impairment: Decreased recall of new information Attention: Selective Selective Attention: Impaired Selective Attention Impairment: Verbal complex;Functional complex Awareness: Appears intact Problem Solving: Impaired Problem Solving Impairment: Verbal basic;Functional basic Safety/Judgment: Impaired Brief Interview for Mental Status (BIMS) Repetition of Three Words (First Attempt): 3 Temporal Orientation: Year: Correct Temporal Orientation: Month: Accurate within 5 days Temporal Orientation: Day: Correct Recall: Sock: Yes, after cueing (something to wear) Recall: Blue: Yes, after cueing (a color) Recall: Bed: Yes, after cueing (a piece of furniture) BIMS Summary Score: 12 Sensation Sensation Light Touch: Impaired Detail Peripheral sensation comments: Hyper sensitivity in B thighs, R>L, 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 Motor  Motor Motor: Other (comment) Motor - Skilled Clinical Observations: Deficits due to mild R-sided hemiparesis and general debiity/deconditioning. Mobility  Bed Mobility Supine to Sit: Supervision/Verbal cueing Sit to Supine: Supervision/Verbal cueing Transfers Sit to Stand: Supervision/Verbal cueing Stand to Sit: Supervision/Verbal cueing  Trunk/Postural  Assessment  Cervical Assessment Cervical Assessment: Exceptions to [facility] (forward head) Thoracic Assessment Thoracic Assessment: Exceptions to [facility] (rounded shoulders) Lumbar Assessment Lumbar Assessment: Exceptions to [facility] (posterior pelvic tilt) Postural Control Postural Control: Deficits on evaluation Righting Reactions: Almost absent, unclear at times if this is d/t effort  Balance Balance Balance Assessed: Yes Static Sitting Balance Static Sitting - Balance Support: Feet supported Static Sitting - Level of Assistance: 6: Modified independent (Device/Increase time) Dynamic Sitting Balance Dynamic Sitting - Balance Support: Feet supported Dynamic Sitting - Level of Assistance: 6: Modified independent (Device/Increase time) Static Standing Balance Static Standing - Balance Support: During functional activity;Bilateral upper extremity supported Static Standing - Level of Assistance: 5: Stand by assistance Dynamic Standing Balance Dynamic Standing - Balance Support: During functional activity;Bilateral upper extremity supported Dynamic Standing - Level of Assistance: 5: Stand by assistance;4: Min assist (CGA- (S)) Extremity/Trunk Assessment RUE Assessment RUE Assessment: Exceptions to [facility] Active Range of Motion (AROM) Comments: WFL General Strength Comments: 3-/5 RUE Body System: Neuro Brunstrum levels for arm and hand: Hand Brunstrum level for hand: Stage VI Isolated joint movements LUE Assessment LUE Assessment: Exceptions to [facility] Active Range of Motion (AROM) Comments: [facility] General Strength Comments: 3+/5   Nena VEAR Moats 12/09/2024, 10:11 AM

## 2024-12-10 ENCOUNTER — Other Ambulatory Visit (HOSPITAL_COMMUNITY): Payer: Self-pay

## 2024-12-10 DIAGNOSIS — K5900 Constipation, unspecified: Secondary | ICD-10-CM | POA: Insufficient documentation

## 2024-12-10 DIAGNOSIS — R350 Frequency of micturition: Secondary | ICD-10-CM | POA: Insufficient documentation

## 2024-12-10 DIAGNOSIS — Z9889 Other specified postprocedural states: Secondary | ICD-10-CM | POA: Insufficient documentation

## 2024-12-10 NOTE — Progress Notes (Signed)
 Inpatient Rehabilitation Discharge Medication Review by a Pharmacist  A complete drug regimen review was completed for this patient to identify any potential clinically significant medication issues.  High Risk Drug Classes Is patient taking? Indication by Medication  Antipsychotic No   Anticoagulant No   Antibiotic No   Opioid Yes Tramadol : pain  Antiplatelet Yes Aspirin : CVA ppx/PAD  Hypoglycemics/insulin  No   Vasoactive Medication Yes Amlodipine , Coreg , Hydralazine : HTN  Chemotherapy No   Other Yes Tylenol , Lidoderm : pain Megace : appetite Crestor : HLD Duloxetine : neuropathy Methylphenidate : wakefulness Levothyroxine : hypothyroidism Zofran : nausea Protonix : GERD Oxybutynin : urinary frequency Melatonin: sleep Gabapentin : neuropathy Robaxin : muscle spasms Ropinirole : RLS      Type of Medication Issue Identified Description of Issue Recommendation(s)  Drug Interaction(s) (clinically significant)     Duplicate Therapy     Allergy     No Medication Administration End Date     Incorrect Dose     Additional Drug Therapy Needed     Significant med changes from prior encounter (inform family/care partners about these prior to discharge).  Restart or discontinue as appropriate. Communicate medication changes with patient/family at discharge  Other       Clinically significant medication issues were identified that warrant physician communication and completion of prescribed/recommended actions by midnight of the next day:  No  Time spent performing this drug regimen review (minutes): 30   Thank you for allowing pharmacy to be a part of this patients care.   Erin [name], PharmD 12/10/2024 8:00 AM    **Pharmacist phone directory can be found on [email or web address] listed under [facility]**

## 2024-12-10 NOTE — Discharge Summary (Signed)
 Physician Discharge Summary  Patient ID: Erin [name] MRN: [id number] DOB/AGE: July 09, 1956 69 y.o.  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 EXAM: ABDOMEN - 1 VIEW COMPARISON:  11/26/2024 FINDINGS: Percutaneous gastrostomy tube with tip over the stomach in the left upper quadrant without significant change. Bowel gas pattern is nonobstructive. No evidence of free peritoneal air. Remainder of the exam is unchanged. IMPRESSION: Nonobstructive bowel gas pattern. Electronically Signed   By: Toribio Agreste M.D.   On: 12/05/2024 14:27   DG Knee 1-2 Views Right Result Date: 11/26/2024 CLINICAL DATA:  Acute right knee pain.  No reported injury. EXAM: RIGHT KNEE - 1-2 VIEW COMPARISON:  None Available. FINDINGS: Minimal tibial spine spur formation. Mild anterior patellar enthesophyte formation at the quadriceps tendon insertion. No fracture, dislocation or effusion. Vascular clips are noted. IMPRESSION: Minimal degenerative changes. Electronically Signed   By: Elspeth Bathe M.D.   On: 11/26/2024 16:56   DG Abd 1 View Result Date: 11/26/2024 CLINICAL DATA:  Constipation. EXAM: DG ABDOMEN 1V COMPARISON:  11/19/2024 FINDINGS: Interval PEG tube. Normal bowel-gas pattern with retained contrast in the colon.  Mild amount of stool. Multiple surgical clips. Right hip prosthesis. IMPRESSION: Mild amount of stool. Electronically Signed   By: Elspeth Bathe M.D.   On: 11/26/2024 16:55   IR GASTROSTOMY TUBE MOD SED Result Date: 11/19/2024 INDICATION: 69 year old with dysphagia. Request for a gastrostomy tube placement. EXAM: PERCUTANEOUS GASTROSTOMY TUBE PLACEMENT WITH FLUOROSCOPIC GUIDANCE Physician: Juliene SAUNDERS. Henn, MD MEDICATIONS: Ancef  2 g, glucagon  0.5 mg ANESTHESIA/SEDATION: Moderate (conscious) sedation was employed during this procedure. A total of Versed  2 mg and fentanyl  100 mcg was administered intravenously at the order of the provider performing the procedure. Total intra-service moderate sedation time: 14 minutes. Patient's level of consciousness and vital signs were monitored continuously by radiology nurse throughout the procedure under the supervision of the provider performing the procedure. FLUOROSCOPY: Radiation Exposure Index (as provided by the fluoroscopic device): 7 mGy Kerma COMPLICATIONS: None immediate. PROCEDURE: Informed consent was obtained for a percutaneous gastrostomy tube. The patient was placed on the interventional table. An orogastric tube was placed with fluoroscopic guidance. The anterior abdomen was prepped and draped in sterile fashion. Maximal barrier sterile technique was utilized including caps, mask, sterile gowns, sterile gloves, sterile drape, hand hygiene and skin antiseptic. Stomach was inflated with air through the orogastric tube. The skin and subcutaneous tissues were anesthetized with 1% lidocaine . Saf-T-Pexy T fastener was deployed within the stomach using fluoroscopic guidance. A 0.018 wire was advanced through the needle. Needle was removed and a micropuncture dilator set was placed. Super stiff Amplatz wire was advanced into the stomach. A 9-French vascular sheath was placed and the orogastric tube was snared using a Gooseneck snare device. The orogastric tube and snare  were pulled out of the patient's mouth.  The snare device was connected to a 20-French gastrostomy tube. The snare device and gastrostomy tube were pulled through the patient's mouth and out the anterior abdominal wall. The gastrostomy tube was cut to an appropriate length. T fastener was cut. Contrast injection through gastrostomy tube confirmed placement within the stomach. Fluoroscopic images were obtained for documentation. The gastrostomy tube was flushed with normal saline. IMPRESSION: Successful fluoroscopic guided percutaneous gastrostomy tube placement. Electronically Signed   By: Juliene Balder M.D.   On: 11/19/2024 21:04   DG Abd 1 View Result Date: 11/19/2024 CLINICAL DATA:  Evaluate oral contrast prior to gastrostomy tube placement. EXAM: ABDOMEN - 1 VIEW COMPARISON:  CT abdomen 11/13/2024 FINDINGS: Feeding tube extends down the esophagus and terminates in the distal stomach/duodenal bulb region. There is oral contrast in the colon. Right hip replacement. Nonobstructive bowel gas pattern. IMPRESSION: 1. Oral contrast in the colon. 2. Feeding tube terminates in the distal stomach/duodenal bulb region. Electronically Signed   By: Juliene Balder M.D.   On: 11/19/2024 18:51   CT ABDOMEN WO CONTRAST Result Date: 11/13/2024 EXAM: CT ABDOMEN WITHOUT CONTRAST 11/13/2024 01:49:00 PM TECHNIQUE: CT of the abdomen was performed without the administration of intravenous contrast. Multiplanar reformatted images are provided for review. Automated exposure control, iterative reconstruction, and/or weight based adjustment of the mA/kV was utilized to reduce the radiation dose to as low as reasonably achievable. COMPARISON: 10/24/2024 and previous. CLINICAL HISTORY: Anatomy evaluation for G-tube placement in IR. FINDINGS: LOWER CHEST: Interval improvement in small pleural effusions with resolving patchy atelectasis/consolidation posteriorly at both lung bases, worse left than right. Blood pool is hypodense compared to the  interventricular septum suggesting anemia. HEPATOBILIARY: The visualized liver is unremarkable. Gallbladder is unremarkable. SPLEEN: Spleen demonstrates no acute abnormality. PANCREAS: Pancreas demonstrates no acute abnormality. ADRENAL GLANDS: Adrenal glands demonstrate no acute abnormality. KIDNEYS: 3 mm calcification peripherally in the upper pole left renal collecting system. No hydronephrosis. No perinephric or periureteral stranding. GI AND BOWEL: Feeding tube extends to the proximal duodenum. There is safe percutaneous window for gastrostomy placement. Residual contrast in the visualized colon, which is nondilated. There is no bowel obstruction. No abnormal bowel wall thickening or distension. PERITONEUM AND RETROPERITONEUM: Aorta is normal in caliber. Origin stenting of celiac access and SMA. Post aortobifem. No ascites or free air. LYMPH NODES: No lymphadenopathy. BONES AND SOFT TISSUES: No acute abnormality of the visualized bones. No focal soft tissue abnormality. IMPRESSION: 1. Safe percutaneous window for gastrostomy placement. 2. Findings suggest anemia. Electronically signed by: Katheleen Faes MD 11/13/2024 04:01 PM EST RP Workstation: [id number]   DG Swallowing Func-Speech Pathology Result Date: 11/11/2024 Table formatting from the original result was not included. Modified Barium Swallow Study Patient Details Name: NIKKY DUBA MRN: [id number] Date of Birth: 07/10/1956 Today's Date: 11/11/2024 HPI/PMH: HPI: 69 yo F adm to [facility] 12/3 for ventral hernia repair. 12/4 ex lap with abd hemorrhage, remained intubated post op. 12/5 abdominal closure. 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 12/9 transfer to [facility]. 12/11 extubated. BSE 11/01/24: NPO, ice chips after oral care. MBS 12/17: silent aspiration, DNFC; NPO. Significant issues with attn/cog. SLP following for cognitive-linguistic deficits and dysphagia. PMHx: anal CA, hypothyroidism, GERD, CAD, cardiomyopathy, PVD, HLD, HTN, RLS. Per 2023 MBS, mild  pharyngeal dysphagia and suspected primary esophageal dysphagia. MBSS completed on 11/11/24 showed mild oropharygneal dysphagia with recommendations of a full liquid/thin liquid diet. Clinical Impression:  Pt presents with a mild oropharyngeal dysphagia per results of MBSS completed today. No aspiration observed; however, there  was deep penetration of thin liquid residue to the level of the vocal cords, which increases pt's risk of aspiration. Recommend initiation of a full liquids, thin liquids diet. SLP will follow to assess diet tolerance and clinically advance diet as indicated. Consider a repeat MBSS once Cortrak is removed. Oral deficits characterized by increased oral prep and transit time with soft solid. Pt able to clear bolus with additional time. Pharyngeal deficits characterized by reduced hyolaryngeal elevation/excursion, reduced base of tongue retraction, incomplete epiglottic inversion, reduced laryngeal vestibule closure, and reduced pharyngeal stripping. Findings -No aspiration observed across trials. -There was transient and stagnant penetration of thin liquids and nectar-thick liquids during and after the swallow. Pt eventually cleared penetrated residue with cued swallow or cued throat clear (followed direction x1 occurrence). -There was min-moderate vallecular and pyriform residue following the majority of residue. Amount of residue appeared to increased as the viscosity increased. *some frames were not recorded due to equipment issue. Detailed diet recommendations and aspiration precautions outlined below. Continue SLP PoC. Factors that may increase risk of adverse event in presence of aspiration Noe & Lianne 2021): Factors that may increase risk of adverse event in presence of aspiration Noe & Lianne 2021): Reduced cognitive function; Frail or deconditioned; Dependence for feeding and/or oral hygiene Recommendations/Plan: Swallowing Evaluation Recommendations Swallowing Evaluation  Recommendations Recommendations: PO diet PO Diet Recommendation: Full liquid diet; Thin liquids (Level 0) Liquid Administration via: Cup; No straw Medication Administration: Crushed with puree Supervision: Staff to assist with self-feeding; Full supervision/cueing for swallowing strategies Swallowing strategies  : Minimize environmental distractions; Slow rate; Small bites/sips; Multiple dry swallows after each bite/sip Postural changes: Position pt fully upright for meals; Stay upright 30-60 min after meals Oral care recommendations: Oral care BID (2x/day) Treatment Plan Treatment Plan Treatment recommendations: Therapy as outlined in treatment plan below Follow-up recommendations: Follow physicians's recommendations for discharge plan and follow up therapies Functional status assessment: Patient has had a recent decline in their functional status and demonstrates the ability to make significant improvements in function in a reasonable and predictable amount of time. Treatment frequency: Min 2x/week Treatment duration: 3 weeks Interventions: Aspiration precaution training; Oropharyngeal exercises; Compensatory techniques; Patient/family education; Trials of upgraded texture/liquids; Diet toleration management by SLP Recommendations Recommendations for follow up therapy are one component of a multi-disciplinary discharge planning process, led by the attending physician.  Recommendations may be updated based on patient status, additional functional criteria and insurance authorization. Assessment: Orofacial Exam: Orofacial Exam Oral Cavity: Oral Hygiene: WFL Oral Cavity - Dentition: Missing dentition Orofacial Anatomy: WFL Oral Motor/Sensory Function: -- (unable to assess d/t cognitive deficits) Anatomy: Anatomy: WFL Boluses Administered: Boluses Administered Boluses Administered: Thin liquids (Level 0); Mildly thick liquids (Level 2, nectar thick); Puree; Solid  Oral Impairment Domain: Oral Impairment Domain Lip  Closure: No labial escape Tongue control during bolus hold: Not tested Bolus preparation/mastication: Slow prolonged chewing/mashing with complete recollection Bolus transport/lingual motion: Brisk tongue motion Oral residue: Trace residue lining oral structures Initiation of pharyngeal swallow : Valleculae  Pharyngeal Impairment Domain: Pharyngeal Impairment Domain Soft palate elevation: No bolus between soft palate (SP)/pharyngeal wall (PW) Laryngeal elevation: Partial superior movement of thyroid  cartilage/partial approximation of arytenoids to epiglottic petiole Anterior hyoid excursion: Partial anterior movement Epiglottic movement: Partial inversion Laryngeal vestibule closure: Incomplete, narrow column air/contrast in laryngeal vestibule Pharyngeal stripping wave : Present - diminished Pharyngeal contraction (A/P view only): N/A Pharyngoesophageal segment opening: Complete distension and complete duration, no obstruction of flow Tongue base retraction: Narrow column of contrast  or air between tongue base and PPW Pharyngeal residue: Collection of residue within or on pharyngeal structures Location of pharyngeal residue: Valleculae; Pyriform sinuses  Esophageal Impairment Domain: Esophageal Impairment Domain Esophageal clearance upright position: -- (not assessed) Pill: Pill Consistency administered: -- (not assessed) Penetration/Aspiration Scale Score: Penetration/Aspiration Scale Score 1.  Material does not enter airway: Puree; Solid 2.  Material enters airway, remains ABOVE vocal cords then ejected out: Thin liquids (Level 0); Mildly thick liquids (Level 2, nectar thick) 3.  Material enters airway, remains ABOVE vocal cords and not ejected out: Mildly thick liquids (Level 2, nectar thick) 5.  Material enters airway, CONTACTS cords and not ejected out: Thin liquids (Level 0) Compensatory Strategies: Compensatory Strategies Compensatory strategies: Yes Other(comment): Effective Effective Other(comment): Puree;  Solid; Thin liquid (Level 0) (downward pressure on tongue with spoon to cue dry swallow)   General Information: Caregiver present: No  Diet Prior to this Study: NPO   Temperature : Normal   Respiratory Status: WFL   Supplemental O2: None (Room air)   History of Recent Intubation: Yes  Behavior/Cognition: Alert; Cooperative Self-Feeding Abilities: Needs assist with self-feeding Baseline vocal quality/speech: Normal Volitional Cough: Unable to elicit Volitional Swallow: Unable to elicit Exam Limitations: No limitations Goal Planning: Prognosis for improved oropharyngeal function: Good Barriers to Reach Goals: Cognitive deficits; Language deficits No data recorded Patient/Family Stated Goal: discharge home Consulted and agree with results and recommendations: Patient; Family member/caregiver; Nurse; Physician Pain: Pain Assessment Pain Assessment: No/denies pain Pain Score: 0 Faces Pain Scale: 0 Breathing: 0 Negative Vocalization: 0 Facial Expression: 0 Body Language: 0 Consolability: 0 PAINAD Score: 0 End of Session: Start Time:SLP Start Time (ACUTE ONLY): 1330 Stop Time: SLP Stop Time (ACUTE ONLY): 1349 Time Calculation:SLP Time Calculation (min) (ACUTE ONLY): 19 min Charges: SLP Evaluations $ SLP Speech Visit: 1 Visit SLP Evaluations $MBS Swallow: 1 Procedure $Swallowing Treatment: 1 Procedure SLP visit diagnosis: SLP Visit Diagnosis: Dysphagia, pharyngeal phase (R13.13); Cognitive communication deficit (R41.841) Past Medical History: Past Medical History: Diagnosis Date  Allergy   Alopecia   Anal cancer (HCC) 08/14/2013  invasive squamous cell ca, s/p radiation 10/20-11/26/14 60.4Gy/8fx and chemo  Anxiety   Aortoiliac occlusive disease (HCC) 08/14/2017  Arthritis   B12 deficiency anemia 09/14/2015  Blood transfusion without reported diagnosis   BPPV (benign paroxysmal positional vertigo) 07/08/2015  Cardiomyopathy (HCC) 11/03/2017  Chronic back pain   Chronic daily headache 03/29/2013  takes bc powder  Closed  right hip fracture (HCC) 09/10/2015  Coronary artery disease involving native coronary artery of native heart without angina pectoris 10/13/2017  DES to mid RCA  Depression   Essential (hemorrhagic) thrombocythemia (HCC) 09/06/2018  Essential hypertension 09/25/2023  Family history of early CAD 04/08/2016  GERD (gastroesophageal reflux disease)   History of hiatal hernia   Hot flashes   Hyperlipidemia   Hyperlipidemia associated with type 2 diabetes mellitus (HCC) 05/11/2017  Hypothyroidism 09/10/2015  IBS (irritable bowel syndrome) 03/29/2013  Neck pain 07/08/2015  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  takes neurotin  Peripheral vascular disease 11/03/2017  QT prolongation   Sacroiliitis 09/06/2018  Spasms of the hands or feet 10/08/2017  Syncope 06/07/2016  Tubular adenoma of colon 09/08/2003  Vertigo   Wears glasses  Past Surgical History: Past Surgical History: Procedure Laterality Date  ABDOMINAL AORTOGRAM N/A 08/02/2017  Procedure: ABDOMINAL AORTOGRAM;  Surgeon: Darron Deatrice LABOR, MD;  Location: [facility];  Service: Cardiovascular;  Laterality: N/A;  ABDOMINAL AORTOGRAM W/LOWER EXTREMITY Bilateral 09/03/2024  Procedure: ABDOMINAL AORTOGRAM W/LOWER EXTREMITY;  Surgeon:  Serene Gaile ORN, MD;  Location: [facility];  Service: Cardiovascular;  Laterality: Bilateral;  AORTA - BILATERAL FEMORAL ARTERY BYPASS GRAFT N/A 08/14/2017  Procedure: AORTA BIFEMORAL BYPASS GRAFT;  Surgeon: Oris Krystal FALCON, MD;  Location: [facility] OR;  Service: Vascular;  Laterality: N/A;  COLONOSCOPY    COLONOSCOPY N/A 08/29/2024  Procedure: COLONOSCOPY;  Surgeon: Wilhelmenia Aloha Raddle., MD;  Location: [facility];  Service: Gastroenterology;  Laterality: N/A;  CORONARY STENT INTERVENTION N/A 10/13/2017  Procedure: CORONARY STENT INTERVENTION;  Surgeon: Mady Bruckner, MD;  Location: [facility];  Service: Cardiovascular;  Laterality: N/A;  dental implant    ECTOPIC PREGNANCY SURGERY    EMBOLECTOMY N/A 08/14/2017  Procedure: EMBOLECTOMY  FEMORAL;  Surgeon: Oris Krystal FALCON, MD;  Location[facility] OR;  Service: Vascular;  Laterality: N/A;  FEMORAL-POPLITEAL BYPASS GRAFT Right 08/14/2017  Procedure: Right Femoral to Above Knee Popliteal Bypass Graft using Non-Reversed Greater Saphenous Vein Graft from Right Leg;  Surgeon: Oris Krystal FALCON, MD;  Location: [facility] OR;  Service: Vascular;  Laterality: Right;  FLEXIBLE SIGMOIDOSCOPY N/A 08/14/2013  Procedure: FLEXIBLE SIGMOIDOSCOPY;  Surgeon: Gwendlyn ONEIDA Buddy, MD;  Location: [facility];  Service: Endoscopy;  Laterality: N/A;  LAPAROTOMY N/A 10/24/2024  Procedure: EXPLORATORY LAPAROTOMY, ABDOMINAL PACKING AND WOUND VAC APPLICATION;  Surgeon: Debby Hila, MD;  Location: [facility] OR[facility];  Service: General;  Laterality: N/A;  (8) EIGHT 18X18 LAP SPONGE PACKED  LAPAROTOMY N/A 10/25/2024  Procedure: LAPAROTOMY, EXPLORATORY;  Surgeon: Rubin Calamity, MD;  Location: [facility];  Service: General;  Laterality: N/A;  REMOVAL OF PACKING  LEFT HEART CATH AND CORONARY ANGIOGRAPHY N/A 10/13/2017  Procedure: LEFT HEART CATH AND CORONARY ANGIOGRAPHY;  Surgeon: Mady Bruckner, MD;  Location: [facility];  Service: Cardiovascular;  Laterality: N/A;  LOWER EXTREMITY ANGIOGRAPHY Bilateral 08/02/2017  Procedure: Lower Extremity Angiography;  Surgeon: Darron Deatrice LABOR, MD;  Location: [facility] INVASIVE CV LAB;  Service: Cardiovascular;  Laterality: Bilateral;  LOWER EXTREMITY INTERVENTION N/A 09/03/2024  Procedure: LOWER EXTREMITY INTERVENTION;  Surgeon: Serene Gaile ORN, MD;  Location: [facility];  Service: Cardiovascular;  Laterality: N/A;  MULTIPLE TOOTH EXTRACTIONS    PERIPHERAL VASCULAR INTERVENTION  05/22/2018  Procedure: PERIPHERAL VASCULAR INTERVENTION;  Surgeon: Serene Gaile ORN, MD;  Location: [facility];  Service: Cardiovascular;;  SMA and Celiac  PILONIDAL CYST EXCISION    POLYPECTOMY    THROMBECTOMY FEMORAL ARTERY Right 08/14/2017  Procedure: THROMBECTOMY FEMORAL ARTERY;  Surgeon: Oris Krystal FALCON, MD;  Location: [facility] OR;   Service: Vascular;  Laterality: Right;  TONSILLECTOMY    TOTAL HIP ARTHROPLASTY  09/11/2015  Procedure: TOTAL HIP ARTHROPLASTY;  Surgeon: Evalene JONETTA Chancy, MD;  Location: [facility] OR;  Service: Orthopedics;;  ULTRASOUND GUIDANCE FOR VASCULAR ACCESS  10/13/2017  Procedure: Ultrasound Guidance For Vascular Access;  Surgeon: Mady Bruckner, MD;  Location: [facility];  Service: Cardiovascular;;  VENTRAL HERNIA REPAIR N/A 10/23/2024  Procedure: REPAIR, HERNIA, VENTRAL, LAPAROSCOPIC;  Surgeon: Rubin Calamity, MD;  Location: [facility];  Service: General;  Laterality: N/A;  VISCERAL ANGIOGRAPHY N/A 03/03/2020  Procedure: MESTENRIC ANGIOGRAPHY;  Surgeon: Serene Gaile ORN, MD;  Location: [facility];  Service: Cardiovascular;  Laterality: N/A;  VISCERAL ANGIOGRAPHY N/A 09/03/2024  Procedure: VISCERAL ANGIOGRAPHY;  Surgeon: Serene Gaile ORN, MD;  Location: [facility];  Service: Cardiovascular;  Laterality: N/A;  VISCERAL ARTERY INTERVENTION N/A 09/03/2024  Procedure: VISCERAL ARTERY INTERVENTION;  Surgeon: Serene Gaile ORN, MD;  Location: [facility];  Service: Cardiovascular;  Laterality:  N/AMERL Peyton JINNY Henry 11/11/2024, 3:17 PM   Labs:  Basic Metabolic Panel: Recent Labs  Lab 12/05/24 0523 12/07/24 0646 12/09/24 0546  NA 135 133* 135  K 4.1 3.3* 3.8  CL 102 101 103  CO2 23 21* 22  GLUCOSE 90 97 102*  BUN 19 17 19   CREATININE 1.75* 1.65* 1.73*  CALCIUM  9.5 9.1 9.2    CBC: Recent Labs  Lab 12/05/24 0523 12/09/24 0546  WBC 6.4 8.2  NEUTROABS 4.0 5.8  HGB 8.7* 8.7*  HCT 26.3* 26.5*  MCV 96.0 96.0  PLT 284 284    CBG: Recent Labs  Lab 12/08/24 1620 12/08/24 2142 12/08/24 2234 12/09/24 0647 12/09/24 1115  GLUCAP 119* 70 109* 99 93    Brief HPI:   BOBETTA KORF is a 69 y.o. female  with PMHx of Anxiety, Aortoiliac occlusive disease Arthritis, BPPV, Cardiomyopathy, CAD, Depression, HTN, HLD, T2DM, Hypothyroidism, PAD, Anal cancer-s/p chemo/XRT 2014, s/p  mesenteric artery stenting (10/14). The patient presented to [facility] on 10/23/2024 for planned robotic hernia repair by Dr. Redmond. Postoperatively she developed hypotension and acute blood loss anemia. CCM consulted for hemorrhagic shock in the setting of hemoperitoneum and requiring intubation and press and transfer to ICU. On 12/4 she returned back to the OR where no active bleeding was identified but the abdomen was packed and wound VAC placed. CT angio abdomen/pelvis: Large right intraperitoneal hematoma with active bleeding from the right epigastric artery, left renal artery occluded at the origin with minimal enhancement. Severe narrowing of the SMA immediately distal to the stent which is patent.  On 12/5 she returned to OR where there was no evidence of bleeding and abdomen was closed.    She was transitioned off of sedation but remained encephalopathic. On 12/8 Neurology consulted and patient underwent STAT CT head that revealed interval development of multiple cortical and subcortical edema areas involving bilateral cerebral and cerebellar hemispheres. MRI showed evidence of bilateral strokes with scattered areas of restricted diffusion throughout the cerebral hemispheres and cerebellum concerning for acute infarcts of embolic etiology. An EEG showed continuous slow, generalized. The study is suggestive of generalized cerebral dysfunction ( encephalopathy). No seizures or epileptiform discharges were seen throughout the recording.  Etiology likely profound intracranial hypoperfusion although some initial concern for embolic due to DIC, ultimately ruled out.  Neuro recommended aspirin  81 mg daily. EF 60-65%, carotid doppler without significant stenosis. She was transferred to [facility] ICU on 10/29/24, stabilized in the ICU and extubated on 12/11 an subsequently transferred to [facility] on 12/15. Hospital course complicated by AKI on CKD, Enterobacter cloacae HCAP, dysphagia and  malnutrition requiring PEC placement on 12/30, anemia, poorly controlled pain, and neurogenic bowel/bladder.    Per chart review, patient lives in a private home with 24/7 assistance from her spouse who is physically capable of assisting her (recently cared for their parents during hospice transition), single level with one-step to enter with grab bars near the tub/shower and toilet. Prior to admission, she was independent of mobility with no assistive device other community ambulation was limited at times. She was independent of ADLs and IADLs, but got assistance from her spouse from grocery shopping. Currently, she is min assist for bed mobility, min assist for sit to stand transfers and min assist +2 for gait up to 15 feet requiring frequent cueing and limited by fatigue. MBS 12-22 showed mild oropharyngeal dysphagia, she was advanced to a regular diet on 12-31 with transition to nocturnal tube feeds  to support nutrition; she is eating 25 to 100% of meals.     Inpatient Rehabilitation Course: TARREN SABREE was admitted to rehab 11/25/2024 for inpatient therapies to consist of PT, ST and OT at least three hours five days a week. Past admission physiatrist, therapy team and rehab RN have worked together to provide customized collaborative inpatient rehab.  Anticoagulation: DVT/anticoagulation:  Mechanical: Sequential compression devices, below knee Bilateral lower extremities. Was on Heparin  initially but due to recent bleeding around PEG tube  this was held. Ambulating 175 ft+.   Continue antiplatelet therapy Asprin 81 mg daily.  Significant bilateral lower extremity sensitivity/pain. 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  Right knee pain- xray showed minimal tibial spine spur formation. Continue Voltaren  gel, lidocaine  patch prn. Robaxin  500 mg TID for muscle cramping in quads and  Tramadol  25 mg prn.   Mood/Behavior/Sleep: Mirtazapine  attempted but discontinued due to lethargy and increased nausea. Continue Ritalin  5 mg a.m. to encourage alertness as daytime lethargy and motivation has significantly improved.   Skin/Wound Care: Reinforce dressing PRN and routine PEG tube care. Wound care/signs and symptoms of infection discussed at discharge.    Dysphagia: Secondary to CVA/anoxic injury required PEG placed 12/30 earliest removal 12/31/24. Intakes greatly improved with addition of megace  400 mg daily. Free water  flushes transitioned to 50 ml BID for patency only. Continue ensure supplementation.   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 Follow up with [facility] outpatient.   Hypokalemia: mild, continue Potassium 40 meq once daily.    Incisional hernia repair reexploration 12/4 and 12/5 c/b hematoma: follow up with general surgery.   Acute metabolic encephalopathy: much improved, monitored-delirium precautions.   AKI on CKD stage IIIb: Required IV fluids and scheduled free water  flushes via PEG. BMP table, flushes decreased.    HCAP (Enterobacter cloacae on tracheal aspirate culture): Completed antibiotic course.   Hypertension: borderline and well controlled. Continue Amlodipine  10 mg, Coreg  12.5 mg, and Hydralazine  50 mg.   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 Serene) on aspirin .  Severe constipation status post multiple intra-abdominal surgeries: Patient without bowel movement, but has been passing gas and does not wish to delay discharge secondary to this. Is going to use her home laxatives along with MiraLAX  that she is bring with her today. No abdominal pain or concerns.   Nausea: improved with PPI , Zofran  given prior to medication admin-improved.   Hypothyroidism: continue Synthroid .   Chronic nocturia/urinary  urgency: Start Myrbetriq  25 mg daily; PVRs ordered. Continue Ditropan  at discharge due cost of myrbetriq . Follow up with PCP.   Planned Outpatient Follow-Up:  -General Surgery  -[facility] -PCP -PM&R   Rehab course: During patient's stay in rehab weekly team conferences were held to monitor patient's progress, set goals and discuss barriers to discharge. At admission, patient required min assist for bed mobility, min assist for sit to stand transfers and min assist +2 for gait up to 15 feet requiring frequent cueing and limited by fatigue.  Occupational Therapy: Patient has met 8 of 9 long term goals due to improved activity tolerance, improved balance, ability to compensate for deficits, and improved coordination.  Patient to discharge at overall supervision level due to continued need for cognitive and physical assistance. Patient's care partner is independent to provide the necessary physical assistance at discharge.  Recommend pt have CGA during higher level dynamic standing balance 2/2 poor righting reactions vs low effort to correct LOB.  He/She will benefit from ongoing OT in skilled outpatient setting to continue to advance functional skills in the area of BADL.   Physical Therapy: Patient has met ALL long term goals due to improved activity tolerance, improved balance, improved postural control, increased strength, decreased pain, ability to compensate for deficits, improved attention, improved awareness, and improved coordination.  Patient to discharge at an ambulatory level supervision.   He/She will benefit from ongoing skilled PT services in skilled outpatient setting to continue to advance safe functional mobility, address ongoing impairments in strength, ROM, balance, endurance, gait, and minimize fall risk.   Speech Therapy:  Discharging at overall supervision A for cognitive-linguistic tasks and requires supervision cues for swallowing compensatory strategies to minimize  overt signs and symptoms of aspiration. Continue regular/thin diet. She will benefit fro ongoing speech therapy services to help maximize communication and cognition for max functional independence.  Discharge plan was discussed with patient and husband [name] and they verbalized understanding and agreed with it.      Disposition: Discharge disposition: 01-Home or Self Care       Diet: Regular   Special Instructions:  -PEG tube maintenance: Flush with 50 mls twice daily.   -No driving or operating heavy machinery until cleared by provider  -No smoking or alcohol or illicit drug use     Allergies as of 12/10/2024       Reactions   Latex Itching   Must use paper tape   Amitiza  [lubiprostone ] Diarrhea   Uncontrollable diarrhea   Nortriptyline  Other (See Comments)   Stomach distention   Sulfa Antibiotics Nausea And Vomiting, Other (See Comments)   GI distress/pain   Tramadol  Itching, Rash   Atorvastatin  Nausea And Vomiting   Claritin [loratadine] Other (See Comments)   Hot flashes        Medication List     PAUSE taking these medications    clopidogrel  75 MG tablet Wait to take this until your doctor or other care provider tells you to start again. Commonly known as: PLAVIX  Take 75 mg by mouth daily.       STOP taking these medications    cyanocobalamin  1000 MCG tablet   feeding supplement (OSMOLITE 1.5 CAL) Liqd   feeding supplement (PROSource TF20) liquid   free water  Soln   gabapentin  250 MG/5ML solution Commonly known as: NEURONTIN  Replaced by: gabapentin  100 MG capsule   gabapentin  600 MG tablet Commonly known as: NEURONTIN    meclizine  25 MG tablet Commonly known as: ANTIVERT    metoCLOPramide  5 MG tablet Commonly known as: REGLAN    nystatin -triamcinolone  cream Commonly known as: MYCOLOG II   tiZANidine 4 MG tablet Commonly known as: ZANAFLEX   topiramate  50 MG tablet Commonly known as: TOPAMAX        TAKE these medications     rOPINIRole  1 MG tablet Commonly known as: REQUIP  Take 1 mg by mouth at bedtime. The timing of this medication is very important.   acetaminophen  325 MG tablet Commonly known as: TYLENOL  Take 1-2 tablets (325-650 mg total) by mouth every 4 (four) hours as needed for mild pain (pain score 1-3). What changed:  medication strength how much to take when to take this reasons to take this   acyclovir  ointment 5 % Commonly known as: ZOVIRAX  Apply 1 Application topically every 3 (three) hours as needed (fever blisters).   amLODipine  5 MG tablet Commonly  known as: NORVASC  Take 10 mg by mouth daily.   aspirin  EC 81 MG tablet Take 1 tablet (81 mg total) by mouth daily. Swallow whole.   bisacodyl  10 MG suppository Commonly known as: DULCOLAX Place 1 suppository (10 mg total) rectally daily as needed for moderate constipation.   carvedilol  25 MG tablet Commonly known as: COREG  Take 1 tablet (25 mg total) by mouth 2 (two) times daily with a meal. What changed:  medication strength how much to take when to take this   diclofenac  Sodium 1 % Gel Commonly known as: VOLTAREN  Apply 2 g topically 4 (four) times daily.   DULoxetine  20 MG capsule Commonly known as: CYMBALTA  Take 1 capsule (20 mg total) by mouth daily. What changed:  how much to take additional instructions   fluticasone  50 MCG/ACT nasal spray Commonly known as: FLONASE  SPRAY 1 SPRAY INTO EACH NOSTRIL DAILY   gabapentin  100 MG capsule Commonly known as: NEURONTIN  Take 1 capsule (100 mg total) by mouth 3 (three) times daily. Replaces: gabapentin  250 MG/5ML solution   Gerhardt's butt cream Crea Apply 1 Application topically 2 (two) times daily.   hydrALAZINE  25 MG tablet Commonly known as: APRESOLINE  Take 3 tablets (75 mg total) by mouth every 8 (eight) hours. What changed:  medication strength how much to take   hydrocortisone  cream 1 % Apply topically 3 (three) times daily as needed for itching.    ketoconazole  2 % shampoo Commonly known as: NIZORAL  Apply to hair daily for 7 days -- leave on for 5 minutes, then wash every other day for 7 days, then wash twice a week for 7 days,  then once a week.   levothyroxine  75 MCG tablet Commonly known as: SYNTHROID  Take 1 tablet (75 mcg total) by mouth daily at 6 (six) AM. What changed: See the new instructions.   lidocaine  5 % Commonly known as: LIDODERM  Place 3 patches onto the skin daily. Remove & Discard patch within 12 hours or as directed by MD   megestrol  400 MG/10ML suspension Commonly known as: MEGACE  Take 10 mLs (400 mg total) by mouth daily.   melatonin 5 MG Tabs Take 1 tablet (5 mg total) by mouth at bedtime as needed.   methocarbamol  750 MG tablet Commonly known as: ROBAXIN  Take 1 tablet (750 mg total) by mouth 3 (three) times daily.   methylphenidate  5 MG tablet Commonly known as: RITALIN  Take 1 tablet (5 mg total) by mouth 2 (two) times daily with breakfast and lunch. What changed: when to take this   nitroGLYCERIN  0.4 MG SL tablet Commonly known as: NITROSTAT  Place 1 tablet (0.4 mg total) under the tongue every 5 (five) minutes as needed for chest pain.   ondansetron  4 MG disintegrating tablet Commonly known as: ZOFRAN -ODT Take 1 tablet (4 mg total) by mouth 3 (three) times daily before meals.   oxybutynin  5 MG tablet Commonly known as: DITROPAN  Take 1 tablet (5 mg total) by mouth 2 (two) times daily.   pantoprazole  40 MG tablet Commonly known as: PROTONIX  Take 1 tablet (40 mg total) by mouth 2 (two) times daily before a meal. What changed: when to take this   polyethylene glycol 17 g packet Commonly known as: MIRALAX  / GLYCOLAX  Take 17 g by mouth daily.   potassium chloride  SA 20 MEQ tablet Commonly known as: KLOR-CON  M Take 2 tablets (40 mEq total) by mouth daily. What changed:  how much to take when to take this   rosuvastatin  40 MG tablet Commonly  known as: CRESTOR  Take 1 tablet (40 mg  total) by mouth daily.   senna-docusate 8.6-50 MG tablet Commonly known as: Senokot-S Take 1 tablet by mouth at bedtime. What changed: how much to take   traMADol  50 MG tablet Commonly known as: ULTRAM  Take 0.5 tablets (25 mg total) by mouth every 6 (six) hours as needed for moderate pain (pain score 4-6).   Vitamin D  50 MCG (2000 UT) tablet Take 2,000 Units by mouth daily.        Follow-up Information     [name] HERO, MD Follow up.   Specialty: Family Medicine Why: Office will call for appointment for hosptial follow up. Contact information: [address] [place] [address] [phone]         Rubin Calamity, MD Follow up.   Specialty: General Surgery Why: Call for an appointment. Contact information: [address] [place] [address] [phone]         [facility] Follow up.   Specialty: Neurology Why: Call for an appointment. Contact information: [address] [place]  [address] [phone]        Emeline Search C, DO Follow up.   Specialty: Physical Medicine and Rehabilitation Why: Office will call for appointment Contact information: [address] [place] [address] [phone]                 Signed: Daphne LOISE Satterfield 12/10/2024, 11:00 AM

## 2024-12-10 NOTE — Progress Notes (Signed)
 Physical Therapy Discharge Summary  Patient Details  Name: Erin [name] MRN: [id number] Date of Birth: February 13, 1956  Date of Discharge from PT service:December 09, 2024  Patient has met 9 of 9 long term goals due to improved activity tolerance, improved balance, improved postural control, increased strength, improved attention, improved awareness, and improved coordination.  Patient to discharge at an ambulatory level Supervision.   Patient's care partner is independent to provide the necessary physical and cognitive assistance at discharge.  Reasons goals not met: NA  Recommendation:  Patient will benefit from ongoing skilled PT services in outpatient setting to continue to advance safe functional mobility, address ongoing impairments in balance, ambulation, endurance, coordination, and minimize fall risk.  Equipment: No equipment provided  Reasons for discharge: treatment goals met and discharge from hospital  Patient/family agrees with progress made and goals achieved: Yes  PT Discharge Precautions/Restrictions Precautions Precautions: Fall Restrictions Weight Bearing Restrictions Per Provider Order: No Pain Interference Pain Interference Pain Effect on Sleep: 3. Frequently Pain Interference with Therapy Activities: 3. Frequently Pain Interference with Day-to-Day Activities: 3. Frequently Vision/Perception  Vision - History Ability to See in Adequate Light: 1 Impaired Perception Perception: Impaired Preception Impairment Details: Inattention/Neglect Perception-Other Comments: R inattention at times Praxis Praxis: WFL  Cognition Overall Cognitive Status: Impaired/Different from baseline Arousal/Alertness: Awake/alert Orientation Level: Oriented X4 Year: 2026 Month: January Day of Week: Correct Attention: Focused Focused Attention: Appears intact Focused Attention Impairment: Functional basic Selective Attention: Impaired (likely baseline) Selective Attention  Impairment: Verbal complex;Functional complex Memory: Impaired Memory Impairment: Decreased recall of new information Awareness: Appears intact Problem Solving: Impaired Problem Solving Impairment: Verbal complex;Functional complex Safety/Judgment: Impaired Sensation Sensation Light Touch: Impaired Detail Peripheral sensation comments: Hyper sensitivity in B thighs, R>L, 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 Motor  Motor Motor: Other (comment) Motor - Skilled Clinical Observations: Deficits due to mild R-sided hemiparesis and general debiity/deconditioning.  Mobility Bed Mobility Bed Mobility: Supine to Sit;Sit to Supine Supine to Sit: Supervision/Verbal cueing Sit to Supine: Supervision/Verbal cueing Transfers Transfers: Sit to Stand;Stand to Sit;Stand Pivot Transfers Sit to Stand: Supervision/Verbal cueing Stand to Sit: Supervision/Verbal cueing Stand Pivot Transfers: Supervision/Verbal cueing Transfer (Assistive device): Rolling walker Locomotion  Gait Ambulation: Yes Gait Assistance: Supervision/Verbal cueing Gait Distance (Feet): 150 Feet Assistive device: Rolling walker Gait Assistance Details: Verbal cues for gait pattern;Verbal cues for technique;Verbal cues for precautions/safety Gait Gait: Yes Gait Pattern: Impaired Gait Pattern: Decreased stride length;Trunk flexed Stairs / Additional Locomotion Stairs: Yes Stair Management Technique: Two rails Number of Stairs: 12 Height of Stairs: 6 Ramp: Supervision/Verbal cueing Curb: Supervision/Verbal cueing Wheelchair Mobility Wheelchair Mobility: No  Trunk/Postural Assessment  Cervical Assessment Cervical Assessment: Exceptions to [facility] (forward head) Thoracic Assessment Thoracic  Assessment: Exceptions to [facility] (rounded shoulders) Lumbar Assessment Lumbar Assessment: Exceptions to [facility] (posterior pelvic tilt) Postural Control Postural Control: Deficits on evaluation Righting Reactions: Almost absent, unclear at times if this is d/t effort  Balance Balance Balance Assessed: Yes Static Sitting Balance Static Sitting - Balance Support: Feet supported Static Sitting - Level of Assistance: 6: Modified independent (Device/Increase time) Dynamic Sitting Balance Dynamic Sitting - Balance Support: Feet supported Dynamic Sitting - Level of Assistance: 6: Modified independent (Device/Increase time) Static Standing Balance Static Standing - Balance Support: During functional activity;Bilateral upper extremity supported Static Standing - Level of Assistance: 5:  Stand by assistance Dynamic Standing Balance Dynamic Standing - Balance Support: During functional activity;Bilateral upper extremity supported Dynamic Standing - Level of Assistance: 5: Stand by assistance;4: Min assist (CGA- (S)) Extremity Assessment RLE Assessment General Strength Comments: Grossly 3+/5 LLE Assessment LLE Assessment: Within Functional Limits   Elsie JAYSON Dawn, PT, DPT 12/10/2024, 4:36 PM

## 2024-12-10 NOTE — Progress Notes (Signed)
 Home supplies given to patient. Husband was educated to flush peg tube.

## 2024-12-10 NOTE — Progress Notes (Signed)
 "                                                        PROGRESS NOTE   Subjective/Complaints: Pt up in gym with OT and husband. Very grateful to the rehab team for her recovery and excited to go home!! Ongoing urinary frequency  ROS: Patient denies fever, rash, sore throat, blurred vision, dizziness, nausea, vomiting, diarrhea, cough, shortness of breath or chest pain, joint or back/neck pain, headache, or mood change.    Objective:   No results found.   Recent Labs    12/09/24 0546  WBC 8.2  HGB 8.7*  HCT 26.5*  PLT 284    Recent Labs    12/09/24 0546  NA 135  K 3.8  CL 103  CO2 22  GLUCOSE 102*  BUN 19  CREATININE 1.73*  CALCIUM  9.2    Intake/Output Summary (Last 24 hours) at 12/10/2024 0920 Last data filed at 12/10/2024 0804 Gross per 24 hour  Intake 580 ml  Output --  Net 580 ml        Physical Exam: Vital Signs Blood pressure (!) 153/53, pulse 78, temperature 99.1 F (37.3 C), temperature source Oral, resp. rate 18, height 5' 3 (1.6 m), weight 46.9 kg, SpO2 99%.   Constitutional: No distress . Vital signs reviewed. HEENT: NCAT, EOMI, oral membranes moist Neck: supple Cardiovascular: RRR without murmur. No JVD    Respiratory/Chest: CTA Bilaterally without wheezes or rales. Normal effort    GI/Abdomen: BS +, non-tender, non-distended Ext: no clubbing, cyanosis, or edema Psych: pleasant and cooperative  Skin:   - hernia repair site closed, Steri-Strips off.  Healing well. - PEG site remains clean, dry, intact.  MSK:      No apparent deformities       Neurologic exam:  Cognition: AAO to person, place, and time.   Follows commands appropriately. Minimal dysarthria. Near normal language  Memory: Poor, but improving Insight: Fair insight into current condition.  Sensation: Allodynia to palpation of the bilateral proximal thighs--improved  CN: 2-12 grossly intact.   Coordination: BL UE and LE ataxia  Spasticity: MAS 0 in all extremities.    Strength: 5-/5 throughout-slightly more weak on the left extremity--stable 1/19   Assessment/Plan: 1. Functional deficits which require 3+ hours per day of interdisciplinary therapy in a comprehensive inpatient rehab setting. Physiatrist is providing close team supervision and 24 hour management of active medical problems listed below. Physiatrist and rehab team continue to assess barriers to discharge/monitor patient progress toward functional and medical goals  Care Tool:  Bathing    Body parts bathed by patient: Right arm, Chest, Abdomen, Front perineal area, Right upper leg, Left upper leg, Left arm, Right lower leg, Left lower leg, Face, Buttocks   Body parts bathed by helper: Buttocks, Left arm, Right lower leg, Left lower leg     Bathing assist Assist Level: Supervision/Verbal cueing     Upper Body Dressing/Undressing Upper body dressing   What is the patient wearing?: Pull over shirt    Upper body assist Assist Level: Set up assist    Lower Body Dressing/Undressing Lower body dressing      What is the patient wearing?: Underwear/pull up, Pants     Lower body assist Assist for lower body dressing: Supervision/Verbal cueing  Toileting Toileting    Toileting assist Assist for toileting: Supervision/Verbal cueing     Transfers Chair/bed transfer  Transfers assist     Chair/bed transfer assist level: Supervision/Verbal cueing     Locomotion Ambulation   Ambulation assist      Assist level: 2 helpers Assistive device: No Device Max distance: 130'   Walk 10 feet activity   Assist     Assist level: Minimal Assistance - Patient > 75% Assistive device: No Device   Walk 50 feet activity   Assist    Assist level: 2 helpers Assistive device: No Device    Walk 150 feet activity   Assist Walk 150 feet activity did not occur: Safety/medical concerns         Walk 10 feet on uneven surface  activity   Assist     Assist  level: Minimal Assistance - Patient > 75%     Wheelchair     Assist Is the patient using a wheelchair?: Yes Type of Wheelchair: Manual    Wheelchair assist level: Dependent - Patient 0% Max wheelchair distance: 150'    Wheelchair 50 feet with 2 turns activity    Assist        Assist Level: Dependent - Patient 0%   Wheelchair 150 feet activity     Assist      Assist Level: Dependent - Patient 0%   Blood pressure (!) 153/53, pulse 78, temperature 99.1 F (37.3 C), temperature source Oral, resp. rate 18, height 5' 3 (1.6 m), weight 46.9 kg, SpO2 99%.   Medical Problem List and Plan: 1. 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 Family ed   2.  Antithrombotics: -DVT/anticoagulation:  Mechanical: Sequential compression devices, below knee Bilateral lower extremities Pharmaceutical: sq Heparin  5000 q8             -antiplatelet therapy: Asprin 81 mg daily    1/11 continue to hold heparin  given recent bleeding around PEG site.  H&H were stable.  SCDs ordered, teds.    -she's walking 175'+ with therapy, so sq hep no longer needed  3. Pain Management: history of peripheral neuropathy Gabapentin  cut back to 100 mg q12 d/t encephalopathy.   PRN tylenol  and tramadol .  -1/6: Right knee pain for approximately 1 week, will get x-ray, Voltaren  gel 4 times daily, depending on findings may need injection--xray with Minimal tibial spine spur formation. Mild anterior patellar enthesophyte formation at the quadriceps tendon insertion. - 1/7: Robaxin  500 mg TID for muscle cramping in quads  1-9: Significant bilateral lower extremity sensitivity/pain.  on chart review, for neuropathy secondary to peripheral artery disease was on gabapentin  600 mg 3 times daily,  Requip , duloxetine , and tizanidine as outpatient.  - Increase gabapentin  to 200 mg twice daily; hesitant to increase further given AKI  - Resume Requip  1 mg nightly  - Resume duloxetine  20 mg daily  - Will increase Robaxin  to 750 mg 3 times daily--hesitant to switch to home tizanidine given risk of sedation but may consider this in the future   1/11  sl improvement in pain. Really can't push gabapentin  any more   -continue lidocaine  patches   -  hopefully duloxetine  and requip  will help as well   1/12: Reduce gabapentin  to 100 mg 3 times daily due to AKI  1-13: Schedule tramadol  50 mg every 6 hours for pain reduction.  Reviewed CT abdomen pelvis December 24, some degenerative disks most severe at L5-S1 and L2-3, with some encroachment on the right L5-S1 neuroforamina.  Otherwise, spaces well-maintained, no obvious spinal stenosis.  1-14: Pain control improved.  Monitor.  1-15: Some concern tramadol  may be causing more lethargy and nausea.  Reduce to 25 mg every 6 hours as needed.  1-19 pain appears controlled   4. Mood/Behavior/Sleep: LCSW to follow for evaluation and support when available.              -antipsychotic agents: N/A             -Continue Ritalin  5 mg a.m. to encourage alertness     1-8: Mirtazapine  7.5 mg nightly added yesterday for sleep and appetite stimulation; patient overly lethargic today, will DC mirtazapine  to see if this improves  - DC'ed mirtazapine  due to lethargy, nausea.  Symptoms significantly improved.  1-13: Significant difficulties with motivation and fatigue ongoing. DC of Ritalin .  Will likely take a while to respond to resumption of home duloxetine  as above.  1-14: Daytime alertness, motivation did significantly respond to DC Ritalin .  Will resume.--With improvement 1-15  1/19--wean ritalin  at discharge? 5. Neuropsych/cognition: This patient is capable of making decisions on her own behalf.   6. Skin/Wound Care: Routine pressure relief measures.   -  Surgical sites look good   7. Fluids/Electrolytes/Nutrition/dysphagia: Monitor I&O and weight. Follow up labs CBC/CMP               Oropharyngeal dysphagia: Dysphagia: Secondary to CVA/anoxic injury.  -PEG placed on 12/30-- -Pt to receive a can of osmolite if eats less than 50% of meal -continue protein supp and free water  -consider appetite stimulant  1/6: Severe constipation contributor to nausea and poor meal intake; nutrition consult today.  Treating constipation as below.  1-7: Converting to bolus feeds if less than 50% meal completion via PEG tube.  Hopeful for p.o. improvement now that she has had a bowel movement  1-11 didn't eat much yesterday given nausea but appetite has been picking up. Hopefully will do better today  1-12: Restart IV fluids as below 1-13: P.o. intakes improving.  Supplement potassium chloride  20 mill equivalents twice daily for 2 days. 1-14: P.o. intake is very poor yesterday.  Resume Ritalin .  Start Megace  400 mg daily. 1-15: Ate only 15% of 1 meal yesterday.  Fluid flushes to 100 cc every 6 hours.  Patient knows to drink more to keep up with this reduction, see #19 below. 1-17: P.o. intakes much improved over the last 2 days, nearly 100%.  BMP with mild hypokalemia, will start standing 40 mill equivalent kdur, stop free water  flushes, encourage p.o. fluids and repeat Monday a.m. 1/19--eating well! Labs near baseline, K+ 3.8 8. Incisional hernia repair reexploration 12/4 and 12/5 c/b hematoma:  -Gen Surgery continuing to follow as needed.    9. Acute bilateral cerebellar/cerebral infarct secondary to profound intracranial hypoperfusion due to hemorrhagic shock/hypotension:  -Continue Asprin. Follow up with [facility] outpatient.    10. Acute metabolic encephalopathy: seems much improved, continue to monitor-delirium precautions.    11. ABLA: secondary to hemorrhagic shock/critical illness. Hemoglobin 8.8 (12/31). Monitor CBC.    - 1/6: hemoglobin stable on  admission labs, add daily iron supplement 325 mg to assist in RBC  repletion.  Add on iron levels and TIBC to labs today--WNL  1-8: DC iron supplement due to stomach upset, nausea  1-9: Bleeding around PEG tube site today.  H&H stable 8.5.  IR evaluated, appreciate recommendations.  Patient has abdominal binder to keep PEG in place when out of bed.  1/19 hgb 8.7, stable  12. AKI on CKD stage IIIb: continue to monitor renal function.  -Right renal infarct: incidental finding on CT imaging on 12/4-likely due to hypoperfusion in setting of hypotension. Continue supportive care.    - Creatinine stable 1.5, BUN remains elevated 42, encourage p.o. fluids.   1-8: Creatinine 1.6 today, BUN 40.  Discussed need to eat fluid intake with patient.  Depending on I's and O's today, may give IV fluids tomorrow.  1-9: Creatinine 1.9, 500 cc fluid bolus followed by 50 cc/h normal saline for 1 day.  BMP in AM.  Discussed increasing p.o. fluids with patient, husband.  1/11 BUN/Cr 43/1.78 yesterday, sl improvement   -continue to push fluids   -resume IVF if needed. Continue flushes thru PEG   -recheck labs Monday   1-12: BUN/creatinine remain elevated, stable.  Give 1 L IV fluid today, increase PEG tube flushes to every 4 hours 200 cc.  Repeat in AM.  1-13: BUN normalized, creatinine stable 1.6.  DC IV fluids, monitor today.  - BUN continues to downtrend, creatinine stable 1.5-1.7.  Likely representing new baseline, monitor with adjustment of fluid flushes  Repeat BMP 1-17, may be able to reduce fluid flushes through PEG--urine/creatinine looks good, will DC free water  flushes.  1/19 BUN/Cr appear stable off H2O flushes 14. HCAP (Enterobacter cloacae on tracheal aspirate culture): Completed antibiotic course.    15. Hypertension: monitor BP per protocol on Amlodipine  10 mg, Coreg  12.5 mg, and Hydralazine  50 mg.    - Blood pressure stable, monitor  - 1-13: Systolic BP up, diastolic remains stable.  Increase  hydralazine  to 75 mg 3 times daily  1-14: BP elevated this a.m.  Prior to medications.  Increase Coreg  to 25 mg twice daily, monitor for bradycardia.  1-15: Blood pressure remains elevated, slightly better than it was yesterday.  Monitor 1 to 2 days with recent changes  -Blood pressure is borderline to fairly well-controlled 1/19    12/10/2024    6:04 AM 12/09/2024    7:37 PM 12/09/2024   12:38 PM  Vitals with BMI  Systolic 153 114 867  Diastolic 53 51 59  Pulse 78 78 72    16. Hypothyrodism: continue Synthroid .    17. 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 Serene)             -on Aspirin     18.  Severe constipation status post multiple intra-abdominal surgeries.  - Last bowel movement 12/28  - KUB today with large stool burden--no signs of obstruction--gave sorbitol  30 mL  Large bowel movement 1-7 with sorbitol ; increase MiraLAX  to twice daily and monitor  Last bowel movement 1-12, large; a few Smear bowel movements 1-14  1-17: P.o. intakes picking up, if no bowel movement the next 2 days will give extra regimen.  1-18: Last bowel movement 1-14; add daily MiraLAX   1/19 no bm since 1/15--give 30cc sorbitol  today 19.  Nausea.  Encouraged use of as needed Zofran .  On Protonix .,  Will increase to twice daily AC.  - 1/11 recurrent yesterday   -d/w nurse, making sure we pretreat with zofran  before meds   -encourage eating  something before taking meds as well   -she's moving bowels  1-15: KUB without concerning findings.  Reduce free water  flushes as above as volume has been exacerbating.  Schedule Zofran  4 mg 3 times daily with meals  1-16: Nausea much improved, p.o. intakes up.  Patient with more energy today.--Continued improvement through the weekend--resolved 1-18/19  20.  Chronic nocturia/urinary urgency.  - 1-18: Start Myrbetriq  25 mg daily; PVRs ordered.    1/19 fairly continent but still frequent--observe today   -needs PVR's checked--will  re-order LOS: 15 days A FACE TO FACE EVALUATION WAS PERFORMED  Joesph JAYSON Likes 12/10/2024, 9:20 AM     "

## 2024-12-12 ENCOUNTER — Encounter: Payer: Self-pay | Admitting: Family Medicine

## 2024-12-12 ENCOUNTER — Ambulatory Visit: Admitting: Family Medicine

## 2024-12-12 VITALS — BP 102/60 | HR 81 | Temp 99.1°F | Ht 63.0 in | Wt 97.4 lb

## 2024-12-12 DIAGNOSIS — I6930 Unspecified sequelae of cerebral infarction: Secondary | ICD-10-CM | POA: Diagnosis not present

## 2024-12-12 DIAGNOSIS — I739 Peripheral vascular disease, unspecified: Secondary | ICD-10-CM

## 2024-12-12 DIAGNOSIS — N179 Acute kidney failure, unspecified: Secondary | ICD-10-CM

## 2024-12-12 DIAGNOSIS — G63 Polyneuropathy in diseases classified elsewhere: Secondary | ICD-10-CM

## 2024-12-12 MED ORDER — GABAPENTIN 300 MG PO CAPS: 300.0000 mg | ORAL_CAPSULE | Freq: Three times a day (TID) | ORAL | Status: AC

## 2024-12-12 NOTE — Patient Instructions (Addendum)
 Go up to 300 mg three times a day on the gabapentin  to help with the leg pain.

## 2024-12-12 NOTE — Progress Notes (Signed)
 "  Established Patient Office Visit  Subjective   Patient ID: Erin [name], female    DOB: 09-Jul-1956  Age: 69 y.o. MRN: [id number]  Chief Complaint  Patient presents with   Hospitalization Follow-up    HPI Discussed the use of AI scribe software for clinical note transcription with the patient, who gave verbal consent to proceed.  History of Present Illness   Erin [name] is a 69 year old female who presents with complications following hernia surgery, including strokes and kidney dysfunction. She is accompanied by her husband, who is actively involved in her care.  She underwent hernia surgery on December 3rd, complicated by a mesh tack puncturing a vessel with significant bleeding and subsequent strokes. She was hospitalized in rehab for 14 days after surgery. Her husband reports low blood pressure during that period.  She has persistent bilateral thigh pain described as arthritis. Lidocaine  patches have not helped. She previously used diclofenac  gel and gabapentin , but her gabapentin  dose was reduced because of kidney dysfunction. Pain is worse with touch. Her husband notes itching when she tapers off gabapentin .  Her kidney function declined during her hospital stay and medications have been adjusted based on creatinine levels. She is currently off Plavix  because of bleeding concerns and is taking aspirin .  She has a G-tube but is eating and drinking by mouth. She will be having this removed soon at the 8 week mark.  She is scheduled for outpatient therapy. Her balance is poor and she is at high risk of falls. She has not been using a walker or cane consistently, relying instead on her husband for support.  She has frequent loose stools, described by her husband as plenty of running. She is on a regular diet with supervision cues for swallowing.       Current Outpatient Medications  Medication Instructions   acetaminophen  (TYLENOL ) 325-650 mg, Oral, Every 4 hours PRN    acyclovir  ointment (ZOVIRAX ) 5 % 1 Application, Topical, Every  3 hours PRN   amLODipine  (NORVASC ) 10 mg, Daily   aspirin  EC 81 mg, Oral, Daily, Swallow whole.   bisacodyl  (DULCOLAX) 10 mg, Rectal, Daily PRN   carvedilol  (COREG ) 25 mg, Oral, 2 times daily with meals   [Paused] clopidogrel  (PLAVIX ) 75 mg, Daily   diclofenac  Sodium (VOLTAREN ) 2 g, Topical, 4 times daily   DULoxetine  (CYMBALTA ) 20 mg, Oral, Daily   fluticasone  (FLONASE ) 50 MCG/ACT nasal spray SPRAY 1 SPRAY INTO EACH NOSTRIL DAILY   gabapentin  (NEURONTIN ) 300 mg, Oral, 3 times daily   hydrALAZINE  (APRESOLINE ) 75 mg, Oral, Every 8 hours   hydrocortisone  cream 1 % Topical, 3 times daily PRN   ketoconazole  (NIZORAL ) 2 % shampoo Apply to hair daily for 7 days -- leave on for 5 minutes, then wash every other day for 7 days, then wash twice a week for 7 days,  then once a week.   levothyroxine  (SYNTHROID ) 75 mcg, Oral, Daily   lidocaine  (LIDODERM ) 5 % 3 patches, Transdermal, Daily, Remove & Discard patch within 12 hours or as directed by MD   megestrol  (MEGACE ) 400 mg, Oral, Daily   melatonin 5 mg, Oral, At bedtime PRN   methocarbamol  (ROBAXIN ) 750 mg, Oral, 3 times daily   methylphenidate  (RITALIN ) 5 mg, Oral, 2 times daily with breakfast and lunch   nitroGLYCERIN  (NITROSTAT ) 0.4 mg, Sublingual, Every 5 min PRN   Nystatin  (GERHARDT'S BUTT CREAM) CREA 1 Application, Topical, 2 times daily   ondansetron  (ZOFRAN -ODT) 4 mg, Oral, 3  times daily before meals   oxybutynin  (DITROPAN ) 5 mg, Oral, 2 times daily   pantoprazole  (PROTONIX ) 40 mg, Oral, 2 times daily before meals   polyethylene glycol (MIRALAX  / GLYCOLAX ) 17 g, Oral, Daily   potassium chloride  SA (KLOR-CON  M) 20 MEQ tablet 40 mEq, Oral, Daily   rOPINIRole  (REQUIP ) 1 mg, Daily at bedtime   rosuvastatin  (CRESTOR ) 40 mg, Oral, Daily   senna-docusate (SENOKOT-S) 8.6-50 MG tablet 1 tablet, Oral, Daily at bedtime   traMADol  (ULTRAM ) 25 mg, Oral, Every 6 hours PRN   Vitamin D  2,000  Units, Daily    Patient Active Problem List   Diagnosis Date Noted   H/O percutaneous endoscopic gastrostomy (PEG) 12/10/2024   Constipation 12/10/2024   Urinary frequency 12/10/2024   CVA (cerebral vascular accident) (HCC) 11/25/2024   Protein-calorie malnutrition, severe 11/18/2024   Malnutrition of moderate degree 10/25/2024   S/P hernia repair 10/23/2024   Shock (HCC) 10/23/2024   History of colonic polyps 08/29/2024   Renal artery stenosis 07/02/2024   AKI (acute kidney injury) 01/14/2024   Essential hypertension 09/25/2023   Restless leg syndrome 04/20/2023   Preoperative cardiovascular examination 05/10/2022   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/03/2020   Hyperlipidemia 02/12/2020   Cardiomyopathy (HCC) 11/03/2017   Peripheral vascular disease 11/03/2017   Coronary artery disease involving native coronary artery of native heart without angina pectoris 10/13/2017   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     Objective:     BP 102/60   Pulse 81   Temp 99.1 F (37.3 C) (Oral)   Ht 5' 3 (1.6 m)   Wt 97 lb 6.4 oz (44.2 kg)   SpO2 97%   BMI 17.25 kg/m    Physical Exam Vitals reviewed.  Constitutional:      Appearance: Normal appearance. She is underweight.  Cardiovascular:     Rate and Rhythm: Normal rate and regular rhythm.     Heart sounds: Normal heart sounds.  No murmur heard. Pulmonary:     Effort: Pulmonary effort is normal.     Breath sounds: No wheezing, rhonchi or rales.  Abdominal:     General: Bowel sounds are normal.  Neurological:     Mental Status: She is alert and oriented to person, place, and time. Mental status is at baseline.     Motor: Weakness (generalized) present.  Psychiatric:        Mood and Affect: Mood and affect normal.        Speech: Speech is slurred.        Behavior: Behavior normal.      No results found for any visits on 12/12/24.    The ASCVD Risk score (Arnett DK, et al., 2019) failed to calculate for the following reasons:   Risk score cannot be calculated because patient has a medical history suggesting prior/existing ASCVD   * - Cholesterol units were assumed    Assessment &  Plan:  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 -     Gabapentin ; Take 1 capsule (300 mg total) by mouth 3 (three) times daily.  History of CVA with residual deficit  Acute kidney injury   Assessment and Plan    Recent ischemic stroke Following abdominal surgery with hemoperitoneum, resulting in multi-stroke events. Recovery is ongoing with a potential 25-month timeline. Uncertainty remains regarding the extent of brain damage and functional recovery. - Continue outpatient rehabilitation therapy.  Acute kidney injury Likely secondary to hypotension and hypoperfusion during the surgical event. Recovery is expected over 3-6 months. - Will recheck kidney function in April.  Polyneuropathy Chronic polyneuropathy with increased pain likely due to reduced gabapentin  dosage. Symptoms include itching and pain upon touch, possibly exacerbated by recent hospitalization and immobility. - Increased gabapentin  to 300 mg three times a day.  Gastrostomy tube management Gastrostomy tube in place post-surgery, with plans for removal after six weeks. Currently eating and drinking by mouth. - Continue current diet and hydration. -  Plan for G-tube removal after six weeks.  Antiplatelet therapy management Plavix  was paused due to bleeding risk following surgery. Decision to restart Plavix  is pending further evaluation and clearance from the surgical team. - Continue aspirin  therapy.     I personally spent a total of 35 minutes in the care of the patient today including getting/reviewing separately obtained history, performing a medically appropriate exam/evaluation, counseling and educating, documenting clinical information in the EHR, and communicating results.    No follow-ups on file.    Heron [name] Sharper, MD "

## 2024-12-23 ENCOUNTER — Encounter: Admitting: Registered Nurse

## 2025-01-08 ENCOUNTER — Encounter: Admitting: Registered Nurse

## 2025-02-26 ENCOUNTER — Encounter: Admitting: Family Medicine

## 2025-03-03 ENCOUNTER — Ambulatory Visit

## 2025-03-11 ENCOUNTER — Ambulatory Visit: Admitting: Neurology
